# Patient Record
Sex: Female | Born: 1960
Health system: Southern US, Community
[De-identification: ages and names within clinical notes are randomized; demographics above are authoritative.]

## PROBLEM LIST (undated history)

## (undated) DIAGNOSIS — D473 Essential (hemorrhagic) thrombocythemia: Secondary | ICD-10-CM

## (undated) DIAGNOSIS — D649 Anemia, unspecified: Secondary | ICD-10-CM

## (undated) DIAGNOSIS — E785 Hyperlipidemia, unspecified: Secondary | ICD-10-CM

## (undated) DIAGNOSIS — F411 Generalized anxiety disorder: Secondary | ICD-10-CM

## (undated) DIAGNOSIS — I255 Ischemic cardiomyopathy: Secondary | ICD-10-CM

## (undated) DIAGNOSIS — Z95810 Presence of automatic (implantable) cardiac defibrillator: Secondary | ICD-10-CM

## (undated) DIAGNOSIS — I671 Cerebral aneurysm, nonruptured: Secondary | ICD-10-CM

## (undated) DIAGNOSIS — T781XXA Other adverse food reactions, not elsewhere classified, initial encounter: Secondary | ICD-10-CM

## (undated) DIAGNOSIS — K219 Gastro-esophageal reflux disease without esophagitis: Secondary | ICD-10-CM

## (undated) DIAGNOSIS — T7819XA Other adverse food reactions, not elsewhere classified, initial encounter: Secondary | ICD-10-CM

## (undated) DIAGNOSIS — M1990 Unspecified osteoarthritis, unspecified site: Secondary | ICD-10-CM

## (undated) DIAGNOSIS — I340 Nonrheumatic mitral (valve) insufficiency: Secondary | ICD-10-CM

## (undated) DIAGNOSIS — G709 Myoneural disorder, unspecified: Secondary | ICD-10-CM

## (undated) DIAGNOSIS — C44321 Squamous cell carcinoma of skin of nose: Secondary | ICD-10-CM

## (undated) DIAGNOSIS — C3490 Malignant neoplasm of unspecified part of unspecified bronchus or lung: Secondary | ICD-10-CM

## (undated) DIAGNOSIS — K589 Irritable bowel syndrome without diarrhea: Secondary | ICD-10-CM

## (undated) DIAGNOSIS — B029 Zoster without complications: Secondary | ICD-10-CM

## (undated) DIAGNOSIS — A599 Trichomoniasis, unspecified: Secondary | ICD-10-CM

## (undated) DIAGNOSIS — I739 Peripheral vascular disease, unspecified: Secondary | ICD-10-CM

## (undated) DIAGNOSIS — I10 Essential (primary) hypertension: Secondary | ICD-10-CM

## (undated) DIAGNOSIS — I2510 Atherosclerotic heart disease of native coronary artery without angina pectoris: Secondary | ICD-10-CM

## (undated) DIAGNOSIS — Z972 Presence of dental prosthetic device (complete) (partial): Secondary | ICD-10-CM

## (undated) DIAGNOSIS — Z8701 Personal history of pneumonia (recurrent): Secondary | ICD-10-CM

## (undated) DIAGNOSIS — C44319 Basal cell carcinoma of skin of other parts of face: Secondary | ICD-10-CM

## (undated) DIAGNOSIS — Z973 Presence of spectacles and contact lenses: Secondary | ICD-10-CM

## (undated) DIAGNOSIS — I5020 Unspecified systolic (congestive) heart failure: Secondary | ICD-10-CM

## (undated) DIAGNOSIS — F32A Depression, unspecified: Secondary | ICD-10-CM

## (undated) DIAGNOSIS — Z720 Tobacco use: Secondary | ICD-10-CM

## (undated) DIAGNOSIS — T7840XA Allergy, unspecified, initial encounter: Secondary | ICD-10-CM

## (undated) DIAGNOSIS — I214 Non-ST elevation (NSTEMI) myocardial infarction: Secondary | ICD-10-CM

## (undated) DIAGNOSIS — I509 Heart failure, unspecified: Secondary | ICD-10-CM

## (undated) HISTORY — DX: Allergy, unspecified, initial encounter: T78.40XA

## (undated) HISTORY — PX: JOINT REPLACEMENT: SHX530

## (undated) HISTORY — DX: Unspecified osteoarthritis, unspecified site: M19.90

## (undated) HISTORY — DX: Trichomoniasis, unspecified: A59.9

## (undated) HISTORY — DX: Anemia, unspecified: D64.9

## (undated) HISTORY — PX: APPENDECTOMY: SHX54

## (undated) HISTORY — DX: Myoneural disorder, unspecified: G70.9

## (undated) HISTORY — DX: Depression, unspecified: F32.A

## (undated) HISTORY — DX: Peripheral vascular disease, unspecified: I73.9

---

## 1982-05-09 HISTORY — PX: CHOLECYSTECTOMY OPEN: SUR202

## 1985-05-09 HISTORY — PX: TUBAL LIGATION: SHX77

## 2007-05-10 HISTORY — PX: VAGINAL HYSTERECTOMY: SUR661

## 2007-06-20 ENCOUNTER — Other Ambulatory Visit: Admission: RE | Admit: 2007-06-20 | Discharge: 2007-06-20 | Payer: Self-pay | Admitting: Obstetrics and Gynecology

## 2007-08-09 ENCOUNTER — Ambulatory Visit (HOSPITAL_COMMUNITY): Admission: RE | Admit: 2007-08-09 | Discharge: 2007-08-10 | Payer: Self-pay | Admitting: Obstetrics and Gynecology

## 2007-08-09 ENCOUNTER — Encounter: Payer: Self-pay | Admitting: Obstetrics and Gynecology

## 2010-06-25 DIAGNOSIS — R079 Chest pain, unspecified: Secondary | ICD-10-CM

## 2010-09-21 NOTE — Discharge Summary (Signed)
NAME:  DNIYA, NEUHAUS               ACCOUNT NO.:  [id number]   MEDICAL RECORD NO.:  [id number]          PATIENT TYPE:  OIB   LOCATION:  A331                          FACILITY:  [facility]   PHYSICIAN:  Tilda Burrow, M.D. DATE OF BIRTH:  March 27, 1961   DATE OF ADMISSION:  08/09/2007  DATE OF DISCHARGE:  04/03/2009LH                               DISCHARGE SUMMARY   ADMITTING DIAGNOSES:  1. Dysmenorrhea.  2. Dyspareunia.  3. Uterine retroversion.  4. History of abnormal Pap smear.   DISCHARGE DIAGNOSES:  1. Dysmenorrhea.  2. Dyspareunia.  3. Uterine retroversion.  4. History of abnormal Pap smear.   PROCEDURE:  Vaginal hysterectomy, Jannifer Franklin, MD, August 09, 2007.   DISCHARGE MEDS:  1. Vicodin 5/500, one q.4h. p.r.n. pain.  2. Motrin 200 mg, two q.4h. p.r.n. mild pain.  3. Doxycycline 100 mg p.o. b.i.d. x5 days.   HOSPITAL SUMMARY:  This 50 year old multip female was admitted for  vaginal hysterectomy for dysmenorrhea and dyspareunia as described in  the HPI.  The patient underwent vaginal hysterectomy on August 09, 2007,  with 100 mL blood loss.  She received IV fluids.  Labs were notable for  white count of 8200 on admission, hemoglobin 13, hematocrit 38.4.  Postoperative results include white count 12,000, hemoglobin 11,  hematocrit 32.  She was tolerating a regular diet by the end of the  operative day, she remained afebrile, pulse in the 80s, O2 sat 99% on  room air with blood pressures running 107-137 systolic/46-89 diastolic,  which is normal for her.  She is discharged home.  The antibiotics are  precautionary given the minimal elevation of white count.  Patient is to  watch her fever, increasing abdominal pain, nausea, vomiting and report  problems for any of those findings.  Follow up in 2 weeks, then 6 weeks.      Tilda Burrow, M.D.  Electronically Signed    JVF/MEDQ  D:  08/10/2007  T:  08/10/2007  Job:  [id number]

## 2010-09-21 NOTE — H&P (Signed)
NAME:  Erin [name], Erin [name]               ACCOUNT NO.:  [id number]   MEDICAL RECORD NO.:  [id number]         PATIENT TYPE:  PAMB   LOCATION:  DAY                           FACILITY:  [facility]   PHYSICIAN:  Tilda Burrow, M.D. DATE OF BIRTH:  14-Dec-1960   DATE OF ADMISSION:  08/09/2007  DATE OF DISCHARGE:  LH                              HISTORY & PHYSICAL   PREOPERATIVE DIAGNOSES:  Dysmenorrhea, dyspareunia, uterine  retroversion, history of abnormal pap smear treated with cryocautery  with normal followup.   HISTORY OF PRESENT ILLNESS:  This 50 year old multiparous female is seen  in our office for evaluation of heavy periods and painful intercourse.  She is referred courtesy of the [facility]. She was seen at  [facility] and followup with Dr. Mora Appl. She has had an  ultrasound, which showed retroverted, retroflexed uterus with a simple  ovarian cyst on each side. The uterus had a thick endometrial strip 1.3  cm but the menstrual date was not completely clear. Therefore,  endometrial biopsy has been performed, confirming that the endometrium  is normal. There was hormone effect from exogenous hormones. The patient  had examination, which showed a relatively short vaginal length, a  retroverted, retroflexed uterus. Endometrial biopsy, as stated, has been  performed. Recent pap smears were reported and per patient have been  normal. After discussion and treatment options and given the marked  uterine retroversion and its adverse impact on intercourse, combined  with the heavy periods, the patient declines consideration of  endometrial ablation for her bleeding abnormalities and desires that we  perform a hysterectomy. This is planned to be performed vaginally. Pros  and cons of surgery have been reviewed including risks, complications,  including fever, bleeding, infection, need for transfusion, or  anesthesia complications.   REVIEW OF SYSTEMS:  Negative for stress  incontinence, urge incontinence,  or bowel problems. She has bowel movements twice daily without pain or  diarrhea.   PHYSICAL EXAMINATION:  VITAL SIGNS:  Height 5 foot 2. Weight 117. Blood  pressure 116/70.  HEENT:  Pupils are equal, round, and reactive.  NECK:  Supple.  CARDIOVASCULAR:  Examination unremarkable.  ABDOMEN:  Nontender without masses.  GENITOURINARY:  External genitalia with a multiparous vaginal  examination. She had relatively short vaginal length. Retrograded,  retroflexed uterus but with cervix easily accessible and amenable to  vaginal surgery. Adnexa without masses.  RECTAL:  Tone is good.   LABORATORY DATA:  Ultrasound shows small follicular cysts, felt within  normal limits.   PLAN:  Vaginal hysterectomy August 09, 2007.      Tilda Burrow, M.D.  Electronically Signed     JVF/MEDQ  D:  07/31/2007  T:  07/31/2007  Job:  [id number]

## 2010-09-24 NOTE — Op Note (Signed)
NAME:  Erin [name], Erin [name]               ACCOUNT NO.:  [id number]   MEDICAL RECORD NO.:  [id number]          PATIENT TYPE:  OIB   LOCATION:  A331                          FACILITY:  [facility]   PHYSICIAN:  Tilda Burrow, M.D. DATE OF BIRTH:  1960-06-28   DATE OF PROCEDURE:  DATE OF DISCHARGE:  08/10/2007                               OPERATIVE REPORT   PREOPERATIVE DIAGNOSIS:  1. Dysmenorrhea.  2. Dyspareunia.  3. Uterine retroversion.  4. History of abnormal Pap smear.   POSTOPERATIVE DIAGNOSIS:  1. Dysmenorrhea.  2. Dyspareunia.  3. Uterine retroversion.  4. History of abnormal Pap smear.   PROCEDURE:  Vaginal hysterectomy.   SURGEON:  Tilda Burrow, M.D..   ASSISTANT:  __________   ANESTHESIA:  General.   COMPLICATIONS:  None.   FINDINGS:  Asymmetric cervix evident, with most of the anterior lip  having apparently disappeared in response to previous cryocautery.   DETAILS OF PROCEDURE:  The patient was taken to the operating room,  prepped and draped for vaginal procedure with high lithotomy leg  support.  The cervix was asymmetric in it is appearance with the  anterior lip of the cervix almost flushed with the anterior vaginal  wall.  However, we were able to palpate few of the margins of the  cervix.  A posterior colpotomy incision could be made in between the  uterosacral ligaments and the cul-de-sac identified.  Weighted speculum  was in the vagina, and we were able to place a curved Zeppelin clamp  across the uterosacral ligament.  Clamping, cutting, and suture ligating  with 0 chromic with the pedicle tagged.  This treated similarly on the  opposite side.  The remainder of the cervix could be circumscribed and  the skin opened.  The lower cardinal ligament can be isolated and  clamped, cut, and suture ligated on the either side.  They were then  merged up the side of the uterus on the either side being careful to  stay close to the cervix clamping, cutting, and  suture ligating.  At  this point, we are going to identify the anterior cervical vaginal  folding and open the anterior reflection of vesicouterine peritoneum.  The upper cardinal ligament and then the broad ligament could then be  serially clamped, cut, and suture ligated __________ .  The cervix was  amputated off  to allow for improved mobility and access to the upper  uterine specimen.  The cervix was rotated in the right side pedicle  clamped, cut, and suture ligated in small bites moving out all the way  to the round ligament.  This __________  uterus was freed up on the  right side.  The pedicle was tagged over and appeared normal.  Ovary was  well out of the way far from the vaginal cuff.  On the left side, the  ovary was more closely visible and easily reached.  Similar technique  was used to clamp across the broad ligament in segments, the utero-  ovarian ligament, and round ligament.  Specimen was taken off.  Pedicles  were inspected and found  to be hemostatic.  A couple of very thin  Mattress sutures were placed on the lateral vaginal wall to achieve  hemostasis on the patient's left side.  The peritoneum was closed in a  running fashion across the top of the surgical line and then the vaginal  cuff closed from the neck.  Good hemostasis was achieved.  Vaginal  packing was inserted.  Foley catheter inserted revealing a clear urine.  The patient went to recovery room in stable condition.      Tilda Burrow, M.D.  Electronically Signed     JVF/MEDQ  D:  08/14/2007  T:  08/15/2007  Job:  [id number]

## 2010-10-10 ENCOUNTER — Other Ambulatory Visit: Payer: Self-pay | Admitting: Medical

## 2011-01-31 LAB — CBC
HCT: 38.4
Hemoglobin: 13
MCHC: 34
MCV: 87
Platelets: 403 — ABNORMAL HIGH
RBC: 4.41
RDW: 13.8
WBC: 8.2

## 2011-01-31 LAB — BASIC METABOLIC PANEL
BUN: 5 — ABNORMAL LOW
CO2: 24
Calcium: 9.7
Chloride: 105
Creatinine, Ser: 0.74
GFR calc Af Amer: >60
GFR calc non Af Amer: >60
Glucose, Bld: 94
Potassium: 4.2
Sodium: 137

## 2011-01-31 LAB — TYPE AND SCREEN
ABO/RH(D): O POS
Antibody Screen: NEGATIVE

## 2011-02-01 LAB — DIFFERENTIAL
Basophils Absolute: 0
Basophils Relative: 0
Eosinophils Absolute: 0
Eosinophils Relative: 0
Lymphocytes Relative: 16
Lymphs Abs: 1.9
Monocytes Absolute: 0.8
Monocytes Relative: 7
Neutro Abs: 9.1 — ABNORMAL HIGH
Neutrophils Relative %: 76

## 2011-02-01 LAB — CBC
HCT: 32.2 — ABNORMAL LOW
Hemoglobin: 11 — ABNORMAL LOW
MCHC: 34
MCV: 86.9
Platelets: 328
RBC: 3.71 — ABNORMAL LOW
RDW: 13.8
WBC: 12 — ABNORMAL HIGH

## 2011-11-01 ENCOUNTER — Ambulatory Visit: Payer: 59 | Admitting: Family Medicine

## 2011-11-22 ENCOUNTER — Ambulatory Visit: Payer: 59 | Admitting: Family Medicine

## 2011-12-25 DIAGNOSIS — R079 Chest pain, unspecified: Secondary | ICD-10-CM

## 2011-12-26 DIAGNOSIS — R079 Chest pain, unspecified: Secondary | ICD-10-CM

## 2012-01-01 ENCOUNTER — Emergency Department (HOSPITAL_COMMUNITY)
Admission: EM | Admit: 2012-01-01 | Discharge: 2012-01-01 | Disposition: A | Discharge status: Home / Self Care | Payer: 59 | Attending: Emergency Medicine | Admitting: Emergency Medicine

## 2012-01-01 ENCOUNTER — Encounter (HOSPITAL_COMMUNITY): Payer: Self-pay | Admitting: Emergency Medicine

## 2012-01-01 ENCOUNTER — Emergency Department (HOSPITAL_COMMUNITY): Payer: 59

## 2012-01-01 DIAGNOSIS — Z9089 Acquired absence of other organs: Secondary | ICD-10-CM | POA: Insufficient documentation

## 2012-01-01 DIAGNOSIS — I10 Essential (primary) hypertension: Secondary | ICD-10-CM | POA: Insufficient documentation

## 2012-01-01 DIAGNOSIS — R079 Chest pain, unspecified: Secondary | ICD-10-CM | POA: Insufficient documentation

## 2012-01-01 DIAGNOSIS — F17200 Nicotine dependence, unspecified, uncomplicated: Secondary | ICD-10-CM | POA: Insufficient documentation

## 2012-01-01 DIAGNOSIS — R42 Dizziness and giddiness: Secondary | ICD-10-CM | POA: Insufficient documentation

## 2012-01-01 LAB — CBC
HCT: 41 % (ref 36.0–46.0)
Hemoglobin: 14.1 g/dL (ref 12.0–15.0)
MCH: 28.4 pg (ref 26.0–34.0)
MCHC: 34.4 g/dL (ref 30.0–36.0)
MCV: 82.7 fL (ref 78.0–100.0)
Platelets: 401 10*3/uL — ABNORMAL HIGH (ref 150–400)
RBC: 4.96 MIL/uL (ref 3.87–5.11)
RDW: 15 % (ref 11.5–15.5)
WBC: 10.1 10*3/uL (ref 4.0–10.5)

## 2012-01-01 LAB — BASIC METABOLIC PANEL
BUN: 10 mg/dL (ref 6–23)
CO2: 25 mEq/L (ref 19–32)
Calcium: 9.9 mg/dL (ref 8.4–10.5)
Chloride: 100 mEq/L (ref 96–112)
Creatinine, Ser: 0.77 mg/dL (ref 0.50–1.10)
GFR calc Af Amer: >90 mL/min (ref >90)
GFR calc non Af Amer: >90 mL/min (ref >90)
Glucose, Bld: 89 mg/dL (ref 70–99)
Potassium: 3.4 mEq/L — ABNORMAL LOW (ref 3.5–5.1)
Sodium: 136 mEq/L (ref 135–145)

## 2012-01-01 LAB — TROPONIN I: Troponin I: <0.3 ng/mL (ref <0.30)

## 2012-01-01 MED ORDER — ASPIRIN 81 MG PO CHEW
324.0000 mg | CHEWABLE_TABLET | Freq: Once | ORAL | Status: AC
  Administered 2012-01-01: 324 mg via ORAL
  Filled 2012-01-01: qty 4

## 2012-01-01 MED ORDER — POTASSIUM CHLORIDE CRYS ER 20 MEQ PO TBCR
20.0000 meq | EXTENDED_RELEASE_TABLET | Freq: Once | ORAL | Status: AC
  Administered 2012-01-01: 20 meq via ORAL
  Filled 2012-01-01: qty 2

## 2012-01-01 NOTE — ED Notes (Addendum)
Pt reports chest pain in the middle of her chest radiating to her left armpit. Pt reports SOB and nausea that began at 6am.

## 2012-01-01 NOTE — ED Provider Notes (Signed)
History   This chart was scribed for Erin Razor, MD by Charolett Bumpers . The patient was seen in room APA06/APA06. Patient's care was started at 0826.    CSN: [id number]  Arrival date & time 01/01/12  8657   First MD Initiated Contact with Patient 01/01/12 [phone]      No chief complaint on file.   (Consider location/radiation/quality/duration/timing/severity/associated sxs/prior treatment) HPI [place] is a 51 y.o. female who presents to the Emergency Department complaining of constant, moderate, mid-sternal chest pain that radiates to her neck and right armpit that woke her up this morning her up around 6 am. Pt reports associated SOB, bilateral foot swelling, chills, diaphoresis and nausea. Pt describes the chest pain as burning. Pt reports that her symptoms are aggravated with deep breaths. Pt denies taking any medications for her symptoms. Pt also reports she had a headache last night prior to going to bed, but states that it has since resolved. Pt denies any leg swelling or cough. Pt reports a h/o chest pain previously but not similar to today's chest pain. Pt reports that she had a stress test 2 months ago due to a different type of chest pain. Pt states she has a h/o HTN. Pt denies any h/o pancreatitis or cardiac catheterizations. Pt reports she is a smoker and reports occasional alcohol use.   Past Medical History  Diagnosis Date  . Hypertension     Past Surgical History  Procedure Date  . Abdominal hysterectomy   . Cholecystectomy     Family History  Problem Relation Age of Onset  . Stroke Mother   . Hypertension Mother   . Diabetes Mother   . Heart failure Other     History  Substance Use Topics  . Smoking status: Current Everyday Smoker -- 0.5 packs/day for 15 years    Types: Cigarettes  . Smokeless tobacco: Never Used  . Alcohol Use: Yes     OCCASSIONAL BEER    OB History    Grav Para Term Preterm Abortions TAB SAB Ect Mult Living   4 4 4        4       Review of Systems  Constitutional: Positive for chills and diaphoresis.  Respiratory: Positive for shortness of breath. Negative for cough.   Cardiovascular: Positive for chest pain. Negative for leg swelling.  Gastrointestinal: Positive for nausea.  Musculoskeletal:       Foot swelling.   Skin: Negative for rash.  All other systems reviewed and are negative.    Allergies  Review of patient's allergies indicates no known allergies.  Home Medications  No current outpatient prescriptions on file.  BP 183/110  Pulse 93  Temp 97.8 F (36.6 C) (Oral)  Resp 18  Ht 5' (1.524 m)  Wt 109 lb (49.442 kg)  BMI 21.29 kg/m2  SpO2 98%  Physical Exam  Nursing note and vitals reviewed. Constitutional: She appears well-developed and well-nourished. No distress.  HENT:  Head: Normocephalic and atraumatic.  Eyes: Conjunctivae are normal. Right eye exhibits no discharge. Left eye exhibits no discharge.  Neck: Neck supple.  Cardiovascular: Normal rate, regular rhythm and normal heart sounds.  Exam reveals no gallop and no friction rub.   No murmur heard. Pulmonary/Chest: Effort normal and breath sounds normal. No respiratory distress. She exhibits tenderness.       Left peristernal border chest wall tenderness. No overlying skin changes.   Abdominal: Soft. She exhibits no distension. There is no tenderness.  Musculoskeletal: She  exhibits no edema and no tenderness.       No calf tenderness. No extremity edema.   Neurological: She is alert.  Skin: Skin is warm and dry.  Psychiatric: She has a normal mood and affect. Her behavior is normal. Thought content normal.    ED Course  Procedures (including critical care time)  DIAGNOSTIC STUDIES: Oxygen Saturation is 98% on room air, normal by my interpretation.    COORDINATION OF CARE:  08:36-Discussed planned course of treatment with the patient including chest x-ray, blood work and an aspirin, who is agreeable at this time.    08:45-Medication Orders: Aspirin chewable tablet 324 mg-once.   Results for orders placed during the hospital encounter of 01/01/12  CBC      Component Value Range   WBC 10.1  4.0 - 10.5 K/uL   RBC 4.96  3.87 - 5.11 MIL/uL   Hemoglobin 14.1  12.0 - 15.0 g/dL   HCT 16.1  09.6 - 04.5 %   MCV 82.7  78.0 - 100.0 fL   MCH 28.4  26.0 - 34.0 pg   MCHC 34.4  30.0 - 36.0 g/dL   RDW 40.9  81.1 - 91.4 %   Platelets 401 (*) 150 - 400 K/uL  TROPONIN I      Component Value Range   Troponin I <0.30  <0.30 ng/mL  BASIC METABOLIC PANEL      Component Value Range   Sodium 136  135 - 145 mEq/L   Potassium 3.4 (*) 3.5 - 5.1 mEq/L   Chloride 100  96 - 112 mEq/L   CO2 25  19 - 32 mEq/L   Glucose, Bld 89  70 - 99 mg/dL   BUN 10  6 - 23 mg/dL   Creatinine, Ser 7.82  0.50 - 1.10 mg/dL   Calcium 9.9  8.4 - 95.6 mg/dL   GFR calc non Af Amer >90  >90 mL/min   GFR calc Af Amer >90  >90 mL/min   Dg Chest 2 View  01/01/2012  *RADIOLOGY REPORT*  Clinical Data: Chest pain, dizziness  CHEST - 2 VIEW  Comparison: 12/25/2011  Findings: Lungs are clear. No pleural effusion or pneumothorax.  Cardiomediastinal silhouette is within normal limits.  Mild degenerative changes of the visualized thoracolumbar spine.  IMPRESSION: No evidence of acute cardiopulmonary disease.   Original Report Authenticated By: Charline Bills, M.D.    EKG:  Rhythm: Normal sinus Rate: 92 Axis: normal Intervals/Conduction: LAE ST segments: normal   1. Chest pain       MDM  50yF with CP. Consider ACS but doubt. Atypical given reproducibility with palpation. Pt just had stress test 1 week ago and normal. EKG today without ischemic changes. Trop normal. CXR without findings. Consider other emergent causes of CP such as PE, dissection, esophageal rupture, etc but doubt. Plan symptomatic tx at this time. Return precautions discussed. Outpt fu.   I personally preformed the services scribed in my presence. The recorded information  has been reviewed and considered. Erin Razor, MD.   9:46 AM Received records from [facility]. Pt had exercise stress echo on 12/26/2011. Results as follows:  1. Exercise capacity is good. 2. The patient achieved a level of 10 METS. 3. This is a negative electrocardiographic stress test. 4. This is a negative echocardiographic stress test. 5. Echocardiographic findings are consistent with normal left ventricular function.     Erin Razor, MD 01/01/12 1017

## 2013-04-19 ENCOUNTER — Ambulatory Visit: Payer: Self-pay | Admitting: Family Medicine

## 2013-05-07 ENCOUNTER — Other Ambulatory Visit: Payer: Self-pay | Admitting: Orthopedic Surgery

## 2013-05-08 ENCOUNTER — Encounter (HOSPITAL_BASED_OUTPATIENT_CLINIC_OR_DEPARTMENT_OTHER): Payer: Self-pay | Admitting: *Deleted

## 2013-05-08 NOTE — Progress Notes (Signed)
Will come in for ekg-bmet 

## 2013-05-10 ENCOUNTER — Encounter (HOSPITAL_BASED_OUTPATIENT_CLINIC_OR_DEPARTMENT_OTHER)
Admission: RE | Admit: 2013-05-10 | Discharge: 2013-05-10 | Disposition: A | Discharge status: Home / Self Care | Payer: Worker's Compensation | Source: Ambulatory Visit | Attending: Orthopedic Surgery | Admitting: Orthopedic Surgery

## 2013-05-10 DIAGNOSIS — Z01812 Encounter for preprocedural laboratory examination: Secondary | ICD-10-CM | POA: Diagnosis not present

## 2013-05-10 DIAGNOSIS — Z01810 Encounter for preprocedural cardiovascular examination: Secondary | ICD-10-CM | POA: Diagnosis not present

## 2013-05-10 DIAGNOSIS — Z01818 Encounter for other preprocedural examination: Secondary | ICD-10-CM | POA: Insufficient documentation

## 2013-05-10 LAB — BASIC METABOLIC PANEL
BUN: 10 mg/dL (ref 6–23)
CO2: 24 mEq/L (ref 19–32)
Calcium: 9.2 mg/dL (ref 8.4–10.5)
Chloride: 104 mEq/L (ref 96–112)
Creatinine, Ser: 0.68 mg/dL (ref 0.50–1.10)
GFR calc Af Amer: >90 mL/min (ref >90)
GFR calc non Af Amer: >90 mL/min (ref >90)
Glucose, Bld: 89 mg/dL (ref 70–99)
Potassium: 4.2 mEq/L (ref 3.7–5.3)
Sodium: 140 mEq/L (ref 137–147)

## 2013-05-14 ENCOUNTER — Encounter (HOSPITAL_BASED_OUTPATIENT_CLINIC_OR_DEPARTMENT_OTHER)
Admission: RE | Disposition: A | Discharge status: Home / Self Care | Payer: Self-pay | Source: Ambulatory Visit | Attending: Orthopedic Surgery

## 2013-05-14 ENCOUNTER — Ambulatory Visit (HOSPITAL_BASED_OUTPATIENT_CLINIC_OR_DEPARTMENT_OTHER): Payer: Worker's Compensation | Admitting: Certified Registered"

## 2013-05-14 ENCOUNTER — Encounter (HOSPITAL_BASED_OUTPATIENT_CLINIC_OR_DEPARTMENT_OTHER): Payer: Worker's Compensation | Admitting: Certified Registered"

## 2013-05-14 ENCOUNTER — Ambulatory Visit (HOSPITAL_BASED_OUTPATIENT_CLINIC_OR_DEPARTMENT_OTHER)
Admission: RE | Admit: 2013-05-14 | Discharge: 2013-05-14 | Disposition: A | Discharge status: Home / Self Care | Payer: Worker's Compensation | Source: Ambulatory Visit | Attending: Orthopedic Surgery | Admitting: Orthopedic Surgery

## 2013-05-14 ENCOUNTER — Encounter (HOSPITAL_BASED_OUTPATIENT_CLINIC_OR_DEPARTMENT_OTHER): Payer: Self-pay | Admitting: Certified Registered"

## 2013-05-14 DIAGNOSIS — M19049 Primary osteoarthritis, unspecified hand: Secondary | ICD-10-CM | POA: Insufficient documentation

## 2013-05-14 DIAGNOSIS — F17200 Nicotine dependence, unspecified, uncomplicated: Secondary | ICD-10-CM | POA: Insufficient documentation

## 2013-05-14 DIAGNOSIS — J4489 Other specified chronic obstructive pulmonary disease: Secondary | ICD-10-CM | POA: Insufficient documentation

## 2013-05-14 DIAGNOSIS — J449 Chronic obstructive pulmonary disease, unspecified: Secondary | ICD-10-CM | POA: Insufficient documentation

## 2013-05-14 DIAGNOSIS — I10 Essential (primary) hypertension: Secondary | ICD-10-CM | POA: Insufficient documentation

## 2013-05-14 DIAGNOSIS — K219 Gastro-esophageal reflux disease without esophagitis: Secondary | ICD-10-CM | POA: Insufficient documentation

## 2013-05-14 HISTORY — PX: FINGER ARTHROPLASTY: SHX5017

## 2013-05-14 HISTORY — DX: Presence of dental prosthetic device (complete) (partial): Z97.2

## 2013-05-14 HISTORY — DX: Presence of spectacles and contact lenses: Z97.3

## 2013-05-14 HISTORY — DX: Gastro-esophageal reflux disease without esophagitis: K21.9

## 2013-05-14 LAB — POCT HEMOGLOBIN-HEMACUE: Hemoglobin: 14.7 g/dL (ref 12.0–15.0)

## 2013-05-14 SURGERY — ARTHROPLASTY, FINGER
Anesthesia: General | Site: Thumb | Laterality: Left

## 2013-05-14 MED ORDER — FENTANYL CITRATE 0.05 MG/ML IJ SOLN
50.0000 ug | INTRAMUSCULAR | Status: DC | PRN
  Administered 2013-05-14: 100 ug via INTRAVENOUS

## 2013-05-14 MED ORDER — LACTATED RINGERS IV SOLN
INTRAVENOUS | Status: DC
  Administered 2013-05-14: 20 mL/h via INTRAVENOUS
  Administered 2013-05-14: 11:00:00 via INTRAVENOUS

## 2013-05-14 MED ORDER — HYDROMORPHONE HCL PF 1 MG/ML IJ SOLN
0.2500 mg | INTRAMUSCULAR | Status: DC | PRN
  Administered 2013-05-14: 0.5 mg via INTRAVENOUS

## 2013-05-14 MED ORDER — OXYCODONE HCL 5 MG PO TABS: 5.0000 mg | ORAL_TABLET | Freq: Once | ORAL | Status: DC | PRN

## 2013-05-14 MED ORDER — EPHEDRINE SULFATE 50 MG/ML IJ SOLN
INTRAMUSCULAR | Status: DC | PRN
  Administered 2013-05-14: 10 mg via INTRAVENOUS

## 2013-05-14 MED ORDER — MIDAZOLAM HCL 2 MG/2ML IJ SOLN
INTRAMUSCULAR | Status: AC
  Filled 2013-05-14: qty 2

## 2013-05-14 MED ORDER — CHLORHEXIDINE GLUCONATE 4 % EX LIQD: 60.0000 mL | Freq: Once | CUTANEOUS | Status: DC

## 2013-05-14 MED ORDER — ONDANSETRON HCL 4 MG/2ML IJ SOLN
INTRAMUSCULAR | Status: DC | PRN
  Administered 2013-05-14: 4 mg via INTRAVENOUS

## 2013-05-14 MED ORDER — BUPIVACAINE-EPINEPHRINE PF 0.5-1:200000 % IJ SOLN
INTRAMUSCULAR | Status: DC | PRN
  Administered 2013-05-14: 25 mL

## 2013-05-14 MED ORDER — MIDAZOLAM HCL 5 MG/5ML IJ SOLN
INTRAMUSCULAR | Status: DC | PRN
  Administered 2013-05-14: 1 mg via INTRAVENOUS

## 2013-05-14 MED ORDER — CEFAZOLIN SODIUM 1-5 GM-% IV SOLN
INTRAVENOUS | Status: AC
  Filled 2013-05-14: qty 50

## 2013-05-14 MED ORDER — MIDAZOLAM HCL 2 MG/2ML IJ SOLN: 1.0000 mg | INTRAMUSCULAR | Status: DC | PRN

## 2013-05-14 MED ORDER — HYDROMORPHONE HCL PF 1 MG/ML IJ SOLN
INTRAMUSCULAR | Status: AC
  Filled 2013-05-14: qty 1

## 2013-05-14 MED ORDER — FENTANYL CITRATE 0.05 MG/ML IJ SOLN
INTRAMUSCULAR | Status: AC
  Filled 2013-05-14: qty 2

## 2013-05-14 MED ORDER — DEXAMETHASONE SODIUM PHOSPHATE 10 MG/ML IJ SOLN
INTRAMUSCULAR | Status: DC | PRN
  Administered 2013-05-14: 4 mg

## 2013-05-14 MED ORDER — MIDAZOLAM HCL 2 MG/2ML IJ SOLN
1.0000 mg | INTRAMUSCULAR | Status: DC | PRN
  Administered 2013-05-14: 2 mg via INTRAVENOUS

## 2013-05-14 MED ORDER — DEXAMETHASONE SODIUM PHOSPHATE 10 MG/ML IJ SOLN
INTRAMUSCULAR | Status: DC | PRN
  Administered 2013-05-14: 10 mg via INTRAVENOUS

## 2013-05-14 MED ORDER — HYDROCODONE-ACETAMINOPHEN 5-325 MG PO TABS: ORAL_TABLET | ORAL | Status: DC

## 2013-05-14 MED ORDER — PROMETHAZINE HCL 25 MG/ML IJ SOLN: 6.2500 mg | INTRAMUSCULAR | Status: DC | PRN

## 2013-05-14 MED ORDER — CEFAZOLIN SODIUM-DEXTROSE 2-3 GM-% IV SOLR
2.0000 g | INTRAVENOUS | Status: AC
  Administered 2013-05-14: 2 g via INTRAVENOUS

## 2013-05-14 MED ORDER — OXYCODONE HCL 5 MG/5ML PO SOLN: 5.0000 mg | Freq: Once | ORAL | Status: DC | PRN

## 2013-05-14 MED ORDER — BUPIVACAINE HCL (PF) 0.25 % IJ SOLN
INTRAMUSCULAR | Status: AC
  Filled 2013-05-14: qty 30

## 2013-05-14 MED ORDER — LIDOCAINE HCL (CARDIAC) 20 MG/ML IV SOLN
INTRAVENOUS | Status: DC | PRN
  Administered 2013-05-14: 30 mg via INTRAVENOUS

## 2013-05-14 MED ORDER — PROPOFOL 10 MG/ML IV BOLUS
INTRAVENOUS | Status: DC | PRN
  Administered 2013-05-14: 150 mg via INTRAVENOUS

## 2013-05-14 MED ORDER — FENTANYL CITRATE 0.05 MG/ML IJ SOLN: 50.0000 ug | Freq: Once | INTRAMUSCULAR | Status: DC

## 2013-05-14 MED ORDER — PROPOFOL 10 MG/ML IV EMUL
INTRAVENOUS | Status: AC
Start: 2013-05-14 — End: 2013-05-14
  Filled 2013-05-14: qty 50

## 2013-05-14 MED ORDER — FENTANYL CITRATE 0.05 MG/ML IJ SOLN
INTRAMUSCULAR | Status: AC
  Filled 2013-05-14: qty 6

## 2013-05-14 SURGICAL SUPPLY — 69 items
BANDAGE ELASTIC 3 VELCRO ST LF (GAUZE/BANDAGES/DRESSINGS) IMPLANT
BIT DRILL 7/64X5 DISP (BIT) ×2 IMPLANT
BLADE MINI RND TIP GREEN BEAV (BLADE) ×2 IMPLANT
BLADE SURG 15 STRL LF DISP TIS (BLADE) ×2 IMPLANT
BLADE SURG 15 STRL SS (BLADE) ×2
BNDG ELASTIC 2 VLCR STRL LF (GAUZE/BANDAGES/DRESSINGS) IMPLANT
BNDG ESMARK 4X9 LF (GAUZE/BANDAGES/DRESSINGS) ×2 IMPLANT
BNDG GAUZE ELAST 4 BULKY (GAUZE/BANDAGES/DRESSINGS) ×2 IMPLANT
CHLORAPREP W/TINT 26ML (MISCELLANEOUS) ×2 IMPLANT
CORDS BIPOLAR (ELECTRODE) ×2 IMPLANT
COVER MAYO STAND STRL (DRAPES) ×2 IMPLANT
COVER TABLE BACK 60X90 (DRAPES) ×2 IMPLANT
CUFF TOURNIQUET SINGLE 18IN (TOURNIQUET CUFF) ×2 IMPLANT
DECANTER SPIKE VIAL GLASS SM (MISCELLANEOUS) IMPLANT
DRAPE EXTREMITY T 121X128X90 (DRAPE) ×2 IMPLANT
DRAPE OEC MINIVIEW 54X84 (DRAPES) ×2 IMPLANT
DRAPE SURG 17X23 STRL (DRAPES) ×2 IMPLANT
DRILL BIT 1/8DIAX5INL DISPOSE (BIT) ×2 IMPLANT
GAUZE XEROFORM 1X8 LF (GAUZE/BANDAGES/DRESSINGS) ×2 IMPLANT
GLOVE BIO SURGEON STRL SZ7.5 (GLOVE) ×2 IMPLANT
GLOVE BIOGEL PI IND STRL 7.0 (GLOVE) ×1 IMPLANT
GLOVE BIOGEL PI IND STRL 8 (GLOVE) ×1 IMPLANT
GLOVE BIOGEL PI IND STRL 8.5 (GLOVE) ×1 IMPLANT
GLOVE BIOGEL PI INDICATOR 7.0 (GLOVE) ×1
GLOVE BIOGEL PI INDICATOR 8 (GLOVE) ×1
GLOVE BIOGEL PI INDICATOR 8.5 (GLOVE) ×1
GLOVE ECLIPSE 7.0 STRL STRAW (GLOVE) ×2 IMPLANT
GLOVE SURG ORTHO 8.0 STRL STRW (GLOVE) ×2 IMPLANT
GOWN STRL REUS W/ TWL LRG LVL3 (GOWN DISPOSABLE) ×1 IMPLANT
GOWN STRL REUS W/TWL LRG LVL3 (GOWN DISPOSABLE) ×1
GOWN STRL REUS W/TWL XL LVL3 (GOWN DISPOSABLE) ×2 IMPLANT
K-WIRE .035X4 (WIRE) IMPLANT
NDL SAFETY ECLIPSE 18X1.5 (NEEDLE) IMPLANT
NEEDLE HYPO 18GX1.5 SHARP (NEEDLE)
NEEDLE HYPO 22GX1.5 SAFETY (NEEDLE) IMPLANT
NEEDLE HYPO 25X1 1.5 SAFETY (NEEDLE) IMPLANT
NEEDLE KEITH (NEEDLE) IMPLANT
NS IRRIG 1000ML POUR BTL (IV SOLUTION) ×2 IMPLANT
PACK BASIN DAY SURGERY FS (CUSTOM PROCEDURE TRAY) ×2 IMPLANT
PAD ABD 8X10 STRL (GAUZE/BANDAGES/DRESSINGS) IMPLANT
PAD CAST 3X4 CTTN HI CHSV (CAST SUPPLIES) ×1 IMPLANT
PAD CAST 4YDX4 CTTN HI CHSV (CAST SUPPLIES) IMPLANT
PADDING CAST ABS 4INX4YD NS (CAST SUPPLIES) ×1
PADDING CAST ABS COTTON 4X4 ST (CAST SUPPLIES) ×1 IMPLANT
PADDING CAST COTTON 3X4 STRL (CAST SUPPLIES) ×1
PADDING CAST COTTON 4X4 STRL (CAST SUPPLIES)
PASSER SUT SWANSON 36MM LOOP (INSTRUMENTS) IMPLANT
SLEEVE SCD COMPRESS KNEE MED (MISCELLANEOUS) IMPLANT
SPLINT FAST PLASTER 5X30 (CAST SUPPLIES)
SPLINT PLASTER CAST FAST 5X30 (CAST SUPPLIES) IMPLANT
SPLINT PLASTER CAST XFAST 4X15 (CAST SUPPLIES) ×20 IMPLANT
SPLINT PLASTER XTRA FAST SET 4 (CAST SUPPLIES) ×20
SPONGE GAUZE 4X4 12PLY (GAUZE/BANDAGES/DRESSINGS) ×2 IMPLANT
STAPLER VISISTAT 35W (STAPLE) IMPLANT
STOCKINETTE 4X48 STRL (DRAPES) ×2 IMPLANT
SUT ETHIBOND 3-0 V-5 (SUTURE) ×4 IMPLANT
SUT ETHILON 3 0 PS 1 (SUTURE) IMPLANT
SUT ETHILON 4 0 PS 2 18 (SUTURE) IMPLANT
SUT FIBERWIRE 2-0 18 17.9 3/8 (SUTURE)
SUT MERSILENE 2.0 SH NDLE (SUTURE) IMPLANT
SUT MERSILENE 4 0 P 3 (SUTURE) IMPLANT
SUT SILK 4 0 PS 2 (SUTURE) IMPLANT
SUT VIC AB 0 SH 27 (SUTURE) IMPLANT
SUT VICRYL 4-0 PS2 18IN ABS (SUTURE) ×2 IMPLANT
SUTURE FIBERWR 2-0 18 17.9 3/8 (SUTURE) IMPLANT
SYR BULB 3OZ (MISCELLANEOUS) ×2 IMPLANT
SYR CONTROL 10ML LL (SYRINGE) IMPLANT
TOWEL OR 17X24 6PK STRL BLUE (TOWEL DISPOSABLE) ×4 IMPLANT
UNDERPAD 30X30 INCONTINENT (UNDERPADS AND DIAPERS) ×2 IMPLANT

## 2013-05-14 NOTE — Op Note (Signed)
[id number] 

## 2013-05-14 NOTE — Transfer of Care (Signed)
Immediate Anesthesia Transfer of Care Note  Patient: Erin [name]  Procedure(s) Performed: Procedure(s): LEFT THUMB CARPAL METACARPAL ARTHROPLASTY (Left)  Patient Location: PACU  Anesthesia Type:GA combined with regional for post-op pain  Level of Consciousness: awake and patient cooperative  Airway & Oxygen Therapy: Patient Spontanous Breathing and Patient connected to face mask oxygen  Post-op Assessment: Report given to PACU RN and Post -op Vital signs reviewed and stable  Post vital signs: Reviewed and stable  Complications: No apparent anesthesia complications

## 2013-05-14 NOTE — Anesthesia Postprocedure Evaluation (Signed)
  Anesthesia Post-op Note  Patient: Erin [name]  Procedure(s) Performed: Procedure(s): LEFT THUMB CARPAL METACARPAL ARTHROPLASTY (Left)  Patient Location: PACU  Anesthesia Type:General and GA combined with regional for post-op pain  Level of Consciousness: awake  Airway and Oxygen Therapy: Patient Spontanous Breathing  Post-op Pain: mild  Post-op Assessment: Post-op Vital signs reviewed, Patient's Cardiovascular Status Stable, Respiratory Function Stable, Patent Airway, No signs of Nausea or vomiting and Pain level controlled  Post-op Vital Signs: Reviewed and stable  Complications: No apparent anesthesia complications

## 2013-05-14 NOTE — Brief Op Note (Signed)
05/14/2013  11:57 AM  PATIENT:  [name]  53 y.o. female  PRE-OPERATIVE DIAGNOSIS:  LEFT THUMB CARPAL METACARPAL OSTEOARTHRITIS  POST-OPERATIVE DIAGNOSIS:  LEFT THUMB CARPAL METACARPAL OSTEOARTHRITIS  PROCEDURE:  Procedure(s): LEFT THUMB CARPAL METACARPAL ARTHROPLASTY (Left)  SURGEON:  Surgeon(s) and Role:    * Tennis Must, MD - Primary    * Wynonia Sours, MD - Assisting  PHYSICIAN ASSISTANT:   ASSISTANTS: Daryll Brod, MD   ANESTHESIA:   regional and general  EBL:  Total I/O In: 1300 [I.V.:1300] Out: -   BLOOD ADMINISTERED:none  DRAINS: none   LOCAL MEDICATIONS USED:  NONE  SPECIMEN:  No Specimen  DISPOSITION OF SPECIMEN:  N/A  COUNTS:  YES  TOURNIQUET:   Total Tourniquet Time Documented: Upper Arm (Left) - 61 minutes Total: Upper Arm (Left) - 61 minutes   DICTATION: .Other Dictation: Dictation Number [id number]  PLAN OF CARE: Discharge to home after PACU  PATIENT DISPOSITION:  PACU - hemodynamically stable.

## 2013-05-14 NOTE — Anesthesia Preprocedure Evaluation (Addendum)
Anesthesia Evaluation  Patient identified by MRN, date of birth, ID band Patient awake    Reviewed: Allergy & Precautions, H&P , NPO status , Patient's Chart, lab work & pertinent test results  Airway Mallampati: I TM Distance: >3 FB Neck ROM: Full    Dental   Pulmonary COPDCurrent Smoker,  + rhonchi         Cardiovascular hypertension, Rhythm:Regular Rate:Normal     Neuro/Psych    GI/Hepatic GERD-  Controlled,  Endo/Other    Renal/GU      Musculoskeletal   Abdominal   Peds  Hematology   Anesthesia Other Findings   Reproductive/Obstetrics                          Anesthesia Physical Anesthesia Plan  ASA: II  Anesthesia Plan: General   Post-op Pain Management:    Induction: Intravenous  Airway Management Planned: LMA  Additional Equipment:   Intra-op Plan:   Post-operative Plan: Extubation in OR  Informed Consent: I have reviewed the patients History and Physical, chart, labs and discussed the procedure including the risks, benefits and alternatives for the proposed anesthesia with the patient or authorized representative who has indicated his/her understanding and acceptance.     Plan Discussed with: CRNA and Surgeon  Anesthesia Plan Comments:         Anesthesia Quick Evaluation

## 2013-05-14 NOTE — Anesthesia Procedure Notes (Addendum)
Anesthesia Regional Block:  Supraclavicular block  Pre-Anesthetic Checklist: ,, timeout performed, Correct Patient, Correct Site, Correct Laterality, Correct Procedure, Correct Position, site marked, Risks and benefits discussed,  Surgical consent,  Pre-op evaluation,  At surgeon's request and post-op pain management  Laterality: Left  Prep: chloraprep       Needles:  Injection technique: Single-shot  Needle Type: Echogenic Stimulator Needle     Needle Length: 5cm 5 cm Needle Gauge: 22 and 22 G    Additional Needles:  Procedures: ultrasound guided (picture in chart) and nerve stimulator Supraclavicular block  Nerve Stimulator or Paresthesia:  Response: 0.6 mA,   Additional Responses:   Narrative:  Start time: 05/14/2013 10:01 AM End time: 05/14/2013 10:10 AM Injection made incrementally with aspirations every 5 mL. Anesthesiologist: Dr Chriss Driver  Additional Notes: 1001-1010 L Supraclavicular Nerve Block POP CHG prep, sterile tech #22 stim/echo needle with good [place] visualization-PIX in chart- and stim down to .68ma Pt has smokers' cough and had one especially violent episode during the block but not related to it at which time the needle was removed Doubt any possibility of lung puncture but will observe closely Multiple neg asp Altamese Dilling .5% w/epi 1:200000 total 25cc+ decadron 4mg  infiltrated No other compl Dr Chriss Driver     Procedure Name: LMA Insertion Date/Time: 05/14/2013 10:38 AM Performed by: Alois Colgan Pre-anesthesia Checklist: Patient identified, Emergency Drugs available, Suction available and Patient being monitored Patient Re-evaluated:Patient Re-evaluated prior to inductionOxygen Delivery Method: Circle System Utilized Preoxygenation: Pre-oxygenation with 100% oxygen Intubation Type: IV induction Ventilation: Mask ventilation without difficulty LMA: LMA inserted LMA Size: 4.0 Number of attempts: 1 Airway Equipment and Method: bite block Placement  Confirmation: positive ETCO2 Tube secured with: Tape Dental Injury: Teeth and Oropharynx as per pre-operative assessment

## 2013-05-14 NOTE — H&P (Signed)
  Erin [name] is an 53 y.o. female.   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  Has had continued pain in cmc of thumb.  Has undergone non operative care including splinting, antiinflammatories, injections without lasting relief.  She has elected to undergo cmc arthroplasty for management of symptoms.  Past Medical History  Diagnosis Date  . Hypertension   . GERD (gastroesophageal reflux disease)   . Wears glasses   . Wears dentures     top    Past Surgical History  Procedure Laterality Date  . Vaginal hysterectomy  2009  . Appendectomy    . Cholecystectomy  1984  . Tubal ligation  1987    Family History  Problem Relation Age of Onset  . Stroke Mother   . Hypertension Mother   . Diabetes Mother   . Heart failure Other    Social History:  reports that she has been smoking Cigarettes.  She has a 7.5 pack-year smoking history. She has never used smokeless tobacco. She reports that she drinks alcohol. Her drug history is not on file.  Allergies: No Known Allergies  Medications Prior to Admission  Medication Sig Dispense Refill  . atenolol (TENORMIN) 25 MG tablet Take by mouth daily.      . hydrochlorothiazide (HYDRODIURIL) 25 MG tablet Take 25 mg by mouth daily.        Results for orders placed during the hospital encounter of 05/14/13 (from the past 48 hour(s))  POCT HEMOGLOBIN-HEMACUE     Status: None   Collection Time    05/14/13  9:25 AM      Result Value Range   Hemoglobin 14.7  12.0 - 15.0 g/dL    No results found.   A comprehensive review of systems was negative except for: Eyes: positive for contacts/glasses Hematologic/lymphatic: positive for bleeding Neurological: positive for headaches  Blood pressure 131/83, pulse 68, temperature 97.8 F (36.6 C), temperature source Oral, resp. rate 18, height 5\' 2"  (1.575 m), weight 123 lb (55.792 kg), SpO2  99.00%.  General appearance: alert, cooperative and appears stated age Head: Normocephalic, without obvious abnormality, atraumatic Neck: supple, symmetrical, trachea midline Resp: clear to auscultation bilaterally Cardio: regular rate and rhythm GI: non tender Extremities: intact sensation and capillary refill all digits.  +epl/fpl/io.  no wounds.   Pulses: 2+ and symmetric Skin: Skin color, texture, turgor normal. No rashes or lesions Neurologic: Grossly normal Incision/Wound: none  Assessment/Plan Left thumb CMC OA.  Non operative and operative treatment options were discussed with the patient and patient wishes to proceed with operative treatment. Risks, benefits, and alternatives of surgery were discussed and the patient agrees with the plan of care.   Mahlani Berninger R 05/14/2013, 9:50 AM

## 2013-05-14 NOTE — Progress Notes (Signed)
Assisted Dr. Chriss Driver with left, ultrasound guided, interscalene  block. Side rails up, monitors on throughout procedure. See vital signs in flow sheet. Tolerated Procedure well.

## 2013-05-14 NOTE — Discharge Instructions (Addendum)
[facility] Instructions °Hand Surgery ° °Wound Care: °Keep your hand elevated above the level of your heart.  Do not allow it to dangle by your side.  Keep the dressing dry and do not remove it unless your doctor advises you to do so.  He will usually change it at the time of your post-op visit.  Moving your fingers is advised to stimulate circulation but will depend on the site of your surgery.  If you have a splint applied, your doctor will advise you regarding movement. ° °Activity: °Do not drive or operate machinery today.  Rest today and then you may return to your normal activity and work as indicated by your physician. ° °Diet:  °Drink liquids today or eat a light diet.  You may resume a regular diet tomorrow.   ° °General expectations: °Pain for two to three days. °Fingers may become slightly swollen. ° °Call your doctor if any of the following occur: °Severe pain not relieved by pain medication. °Elevated temperature. °Dressing soaked with blood. °Inability to move fingers. °White or bluish color to fingers. ° ° °Regional Anesthesia Blocks ° °1. Numbness or the inability to move the "blocked" extremity may last from 3-48 hours after placement. The length of time depends on the medication injected and your individual response to the medication. If the numbness is not going away after 48 hours, call your surgeon. ° °2. The extremity that is blocked will need to be protected until the numbness is gone and the  Strength has returned. Because you cannot feel it, you will need to take extra care to avoid injury. Because it may be weak, you may have difficulty moving it or using it. You may not know what position it is in without looking at it while the block is in effect. ° °3. For blocks in the legs and feet, returning to weight bearing and walking needs to be done carefully. You will need to wait until the numbness is entirely gone and the strength has returned. You should be able to move your leg and foot  normally before you try and bear weight or walk. You will need someone to be with you when you first try to ensure you do not fall and possibly risk injury. ° °4. Bruising and tenderness at the needle site are common side effects and will resolve in a few days. ° °5. Persistent numbness or new problems with movement should be communicated to the surgeon or the [facility] ([phone])/ [facility] ([phone]). ° ° °Post Anesthesia Home Care Instructions ° °Activity: °Get plenty of rest for the remainder of the day. A responsible adult should stay with you for 24 hours following the procedure.  °For the next 24 hours, DO NOT: °-Drive a car °-Operate machinery °-Drink alcoholic beverages °-Take any medication unless instructed by your physician °-Make any legal decisions or sign important papers. ° °Meals: °Start with liquid foods such as gelatin or soup. Progress to regular foods as tolerated. Avoid greasy, spicy, heavy foods. If nausea and/or vomiting occur, drink only clear liquids until the nausea and/or vomiting subsides. Call your physician if vomiting continues. ° °Special Instructions/Symptoms: °Your throat may feel dry or sore from the anesthesia or the breathing tube placed in your throat during surgery. If this causes discomfort, gargle with warm salt water. The discomfort should disappear within 24 hours. ° °

## 2013-05-15 ENCOUNTER — Encounter (HOSPITAL_BASED_OUTPATIENT_CLINIC_OR_DEPARTMENT_OTHER): Payer: Self-pay | Admitting: Orthopedic Surgery

## 2013-05-15 NOTE — Op Note (Signed)
NAME:  Erin [name], Erin [name]               ACCOUNT NO.:  [id number]  MEDICAL RECORD NO.:  [id number]  LOCATION:                                 FACILITY:  PHYSICIAN:  Leanora Cover, MD             DATE OF BIRTH:  DATE OF PROCEDURE:  05/14/2013 DATE OF DISCHARGE:                              OPERATIVE REPORT   PREOPERATIVE DIAGNOSIS:  Left thumb carpometacarpal osteoarthritis.  POSTOPERATIVE DIAGNOSIS:  Left thumb carpometacarpal osteoarthritis.  PROCEDURE:  Left thumb trapeziectomy with ligament reconstruction, tendon interposition with FCR graft.  SURGEON:  Leanora Cover, MD  ASSISTANT:  Daryll Brod, MD.  ANESTHESIA:  General with regional.  IV FLUIDS:  Per anesthesia flow sheet.  ESTIMATED BLOOD LOSS:  Minimal.  COMPLICATIONS:  None.  SPECIMENS:  None.  TOURNIQUET TIME:  61 minutes.  DISPOSITION:  Stable to PACU.  INDICATIONS:  Erin [name] is [name] 53 year old female who in October 2013 suffered an injury at work causing pain in the [facility] joint of her thumb. She underwent nonoperative care with splinting, anti-inflammatories, and injections but continues to have pain.  She wished to have [name] for management of the discomfort.  Risks, benefits, alternatives of the surgery were discussed including risk of blood loss, infection; damage to nerves, vessels, tendons, ligaments, bone; failure of surgery; need for additional surgery, complications with wound healing, continued pain, and loss of range of motion and strength.  She voiced understanding of these risks and elected to proceed.  OPERATIVE COURSE:  After being identified preoperatively by myself, the patient and I agreed upon procedure and site of procedure.  Surgical site was marked.  The risks, benefits, and alternatives of surgery were reviewed and she wished to proceed.  Surgical consent had been signed. She was given IV Ancef as preoperative antibiotic prophylaxis.  Regional block was performed by  anesthesia in preoperative holding.  She was transferred to the operating room and placed on the operating table in supine position.  Left upper extremity on arm board.  General anesthesia was induced by the anesthesiologist.  The left upper extremity was prepped and draped in normal sterile orthopedic fashion.  Surgical pause was performed between surgeons, anesthesia, operating staff, and all were in agreement as to the patient, procedure, and site of procedure. Tourniquet at the proximal aspect of the extremity was inflated to 250 mmHg after exsanguination of the limb with an Esmarch bandage.  An incision was made on the thumb at the glabrous and hairy skin border. This was carried into subcutaneous tissues by spreading technique. Branches of the dorsal branch of the radial nerve were identified and protected.  An incision was made through the periosteum and capsule between the APL and EPB tendons.  The trapezium was identified.  C-arm was used to confirm that it was [name] which was the case.  The trapezium was able to be removed in its entirety by freeing up soft tissue attachments with the [facility] blade and Soil scientist.  Small remaining fragments of bone were then removed.  The wound was copiously irrigated with sterile saline.  The FCR was identified and was intact. An  incision was made on the proximal aspect of the volar forearm.  The FCR was identified here and released.  Released tendon was able to be brought down into the distal wound.  The hole in the metacarpal was made with the drill.  This was made obliquely from the dorsum of the thumb across to the volar base.  The wound was again copiously irrigated with sterile saline.  The FCR tendon was able to be brought through the hole in the metacarpal and looped over itself.  It was secured with an Ethibond suture.  Ethibond suture was placed in the deep portion of the arthroplasty site and used to create the anchovy at the  FCR which was brought down into the void from the trapezium.  This provided good interposition arthroplasty.  Capsule was repaired back over top of the arthroplasty site using Vicryl suture.  The skin was closed with 4-0 nylon in [name] horizontal mattress fashion.  The wound was dressed with sterile Xeroform, 4x4s, and, wrapped with [name] bandage.  The wound in the proximal forearm had been closed and dressed with [name] and 4x4s as well.  [name] thumb spica splint was placed and wrapped with Kerlix and Ace bandage.  Tourniquet was deflated at 61 minutes. Fingertips were pink with brisk capillary refill after deflation of tourniquet.  Operative drapes were broken down.  The patient was awoken from anesthesia safely.  She was transferred back to stretcher and taken to PACU in stable condition.  I will see her back in the office in 1 week for postoperative followup.  We will give her Norco 5/325, 1-2 p.o. q.6 hours p.r.n. pain, dispensed #40.     Leanora Cover, MD     KK/MEDQ  D:  05/14/2013  T:  05/15/2013  Job:  [id number]

## 2014-03-10 ENCOUNTER — Encounter (HOSPITAL_BASED_OUTPATIENT_CLINIC_OR_DEPARTMENT_OTHER): Payer: Self-pay | Admitting: Orthopedic Surgery

## 2015-09-05 ENCOUNTER — Inpatient Hospital Stay (HOSPITAL_COMMUNITY)
Admission: AD | Admit: 2015-09-05 | Discharge: 2015-09-06 | DRG: 313 | Disposition: A | Discharge status: Home / Self Care | Payer: Self-pay | Source: Other Acute Inpatient Hospital | Attending: Cardiology | Admitting: Cardiology

## 2015-09-05 DIAGNOSIS — Z833 Family history of diabetes mellitus: Secondary | ICD-10-CM

## 2015-09-05 DIAGNOSIS — E782 Mixed hyperlipidemia: Secondary | ICD-10-CM | POA: Diagnosis present

## 2015-09-05 DIAGNOSIS — R079 Chest pain, unspecified: Secondary | ICD-10-CM

## 2015-09-05 DIAGNOSIS — F17210 Nicotine dependence, cigarettes, uncomplicated: Secondary | ICD-10-CM | POA: Diagnosis present

## 2015-09-05 DIAGNOSIS — I10 Essential (primary) hypertension: Secondary | ICD-10-CM | POA: Diagnosis present

## 2015-09-05 DIAGNOSIS — E785 Hyperlipidemia, unspecified: Secondary | ICD-10-CM | POA: Diagnosis present

## 2015-09-05 DIAGNOSIS — R0789 Other chest pain: Principal | ICD-10-CM | POA: Insufficient documentation

## 2015-09-05 DIAGNOSIS — Z8249 Family history of ischemic heart disease and other diseases of the circulatory system: Secondary | ICD-10-CM

## 2015-09-05 DIAGNOSIS — Z79899 Other long term (current) drug therapy: Secondary | ICD-10-CM

## 2015-09-05 DIAGNOSIS — Z823 Family history of stroke: Secondary | ICD-10-CM

## 2015-09-05 DIAGNOSIS — K219 Gastro-esophageal reflux disease without esophagitis: Secondary | ICD-10-CM | POA: Diagnosis present

## 2015-09-05 HISTORY — DX: Hyperlipidemia, unspecified: E78.5

## 2015-09-05 MED ORDER — ONDANSETRON HCL 4 MG/2ML IJ SOLN: 4.0000 mg | Freq: Four times a day (QID) | INTRAMUSCULAR | Status: DC | PRN

## 2015-09-05 MED ORDER — ASPIRIN EC 81 MG PO TBEC
81.0000 mg | DELAYED_RELEASE_TABLET | Freq: Every day | ORAL | Status: DC
  Administered 2015-09-06: 81 mg via ORAL
  Filled 2015-09-05: qty 1

## 2015-09-05 MED ORDER — ACETAMINOPHEN 325 MG PO TABS
650.0000 mg | ORAL_TABLET | ORAL | Status: DC | PRN
  Administered 2015-09-06 (×2): 650 mg via ORAL
  Filled 2015-09-05 (×2): qty 2

## 2015-09-05 MED ORDER — NITROGLYCERIN 0.4 MG SL SUBL: 0.4000 mg | SUBLINGUAL_TABLET | SUBLINGUAL | Status: DC | PRN

## 2015-09-05 MED ORDER — ENOXAPARIN SODIUM 30 MG/0.3ML ~~LOC~~ SOLN: 30.0000 mg | SUBCUTANEOUS | Status: DC

## 2015-09-05 MED ORDER — METOPROLOL TARTRATE 25 MG PO TABS
25.0000 mg | ORAL_TABLET | Freq: Two times a day (BID) | ORAL | Status: DC
  Administered 2015-09-06 (×2): 25 mg via ORAL
  Filled 2015-09-05 (×2): qty 1

## 2015-09-05 NOTE — H&P (Signed)
CC: Chest pain  HPI: 55 yo woman, active smoker and history of HTN, transferred to [place] from [facility] ER. She reports having sharp pain on the right side of her neck and face last night but then slept well all night. Woke up after 8 am as normal, had her coffee, felt skipping beat and then chest pain. CP is in the left lower parasternal region, waxing and waning adn describes two kinds of pain. One is point tenderness in the left lower parasternum and other is like pressure radiating across her left breast. No dizziness, SOB, fever, cough, syncope, bleeding, diaphoresis. No known CAD, MI, HF, CVA.   At the outside ER had negative TnI, EKGx2 negative for ischemia, CT head and CXR unremarkable. Her SBP was in  160-170's. She was treated with ASA 325 ms po x1, Morphine, NTG drip.   Here she continues to have pressure and tenderness in the left lower parasternum. BP 166/99. NSR on tele. Repeat ECG here is normal.   She reports that she is off of her HCTZ for about a year. But still on Atenolol.            Review of Systems:  10 systems reveiwed unremarkable except as noted in HIP   Past Medical History  Diagnosis Date  . Hypertension   . GERD (gastroesophageal reflux disease)   . Wears glasses   . Wears dentures     top   No current facility-administered medications on file prior to encounter.   Current Outpatient Prescriptions on File Prior to Encounter  Medication Sig Dispense Refill  . atenolol (TENORMIN) 25 MG tablet Take by mouth daily.    . hydrochlorothiazide (HYDRODIURIL) 25 MG tablet Take 25 mg by mouth daily.    [name] Kitchen HYDROcodone-acetaminophen (NORCO) 5-325 MG per tablet 1-2 tabs po q6 hours prn pain 40 tablet 0     No Known Allergies  Social History   Social History  . Marital Status: Widowed    Spouse Name: N/A  . Number of Children: N/A  . Years of Education: N/A   Occupational History  . Not on file.   Social History Main Topics  . Smoking status: Current Every  Day Smoker -- 0.50 packs/day for 15 years    Types: Cigarettes  . Smokeless tobacco: Never Used  . Alcohol Use: Yes     Comment: OCCASSIONAL BEER  . Drug Use: Not on file  . Sexual Activity: Yes    Birth Control/ Protection: Surgical   Other Topics Concern  . Not on file   Social History Narrative    Family History  Problem Relation Age of Onset  . Stroke Mother   . Hypertension Mother   . Diabetes Mother   . Heart failure Other     PHYSICAL EXAM: There were no vitals filed for this visit. General:  Well appearing. No respiratory difficulty HEENT: normal Neck: supple. no JVD. Carotids 2+ bilat; no bruits. No lymphadenopathy or thryomegaly appreciated. Cor: PMI nondisplaced. Regular rate & rhythm. No rubs, gallops or murmurs. Lungs: clear Abdomen: soft, nontender, nondistended. No hepatosplenomegaly. No bruits or masses. Good bowel sounds. Extremities: no cyanosis, clubbing, rash, edema Neuro: alert & oriented x 3, cranial nerves grossly intact. moves all 4 extremities w/o difficulty. Affect pleasant.   No results found for this or any previous visit (from the past 24 hour(s)). No results found.   ASSESSMENT:  1. Chest pain - Two types: one is atypical and appears musculoskeletal. Other is pressure  like concerning for angina although ECGx3 and TnIx1 negative for ischemia.  - No evidence of acute HF or shock or unstable arrhythmia - CAD risk factors: HTN and smoking   PLAN/DISCUSSION:  Admit to cardiology  Trend TnI  ASA 81 mg po qd Lovenox 1 mg/kg q12h NTG infusion  Counseled to quit smoking.   If rules out for MI, then will plan stress/rest myocardial perfusion scan.   Please see orders for other details.    Wandra Mannan, MD Cardiology

## 2015-09-06 ENCOUNTER — Inpatient Hospital Stay (HOSPITAL_COMMUNITY): Payer: Self-pay

## 2015-09-06 ENCOUNTER — Encounter (HOSPITAL_COMMUNITY): Payer: Self-pay | Admitting: Cardiology

## 2015-09-06 DIAGNOSIS — I209 Angina pectoris, unspecified: Secondary | ICD-10-CM

## 2015-09-06 DIAGNOSIS — K219 Gastro-esophageal reflux disease without esophagitis: Secondary | ICD-10-CM

## 2015-09-06 DIAGNOSIS — E785 Hyperlipidemia, unspecified: Secondary | ICD-10-CM

## 2015-09-06 DIAGNOSIS — I10 Essential (primary) hypertension: Secondary | ICD-10-CM | POA: Diagnosis present

## 2015-09-06 DIAGNOSIS — E782 Mixed hyperlipidemia: Secondary | ICD-10-CM | POA: Diagnosis present

## 2015-09-06 DIAGNOSIS — R079 Chest pain, unspecified: Secondary | ICD-10-CM

## 2015-09-06 HISTORY — DX: Hyperlipidemia, unspecified: E78.5

## 2015-09-06 HISTORY — DX: Gastro-esophageal reflux disease without esophagitis: K21.9

## 2015-09-06 LAB — LIPID PANEL
Cholesterol: 229 mg/dL — ABNORMAL HIGH (ref 0–200)
HDL: 34 mg/dL — ABNORMAL LOW (ref >40)
LDL Cholesterol: 130 mg/dL — ABNORMAL HIGH (ref 0–99)
Total CHOL/HDL Ratio: 6.7 RATIO
Triglycerides: 324 mg/dL — ABNORMAL HIGH (ref <150)
VLDL: 65 mg/dL — ABNORMAL HIGH (ref 0–40)

## 2015-09-06 LAB — NM MYOCAR MULTI W/SPECT W/WALL MOTION / EF
Estimated workload: 1 METS
Exercise duration (min): 0 min
Exercise duration (sec): 0 s
MPHR: 167 {beats}/min
Peak HR: 99 {beats}/min
Percent HR: 59 %
Rest HR: 57 {beats}/min

## 2015-09-06 LAB — COMPREHENSIVE METABOLIC PANEL
ALT: 14 U/L (ref 14–54)
AST: 16 U/L (ref 15–41)
Albumin: 3 g/dL — ABNORMAL LOW (ref 3.5–5.0)
Alkaline Phosphatase: 68 U/L (ref 38–126)
Anion gap: 9 (ref 5–15)
BUN: 8 mg/dL (ref 6–20)
CO2: 21 mmol/L — ABNORMAL LOW (ref 22–32)
Calcium: 8.6 mg/dL — ABNORMAL LOW (ref 8.9–10.3)
Chloride: 109 mmol/L (ref 101–111)
Creatinine, Ser: 0.7 mg/dL (ref 0.44–1.00)
GFR calc Af Amer: >60 mL/min (ref >60)
GFR calc non Af Amer: >60 mL/min (ref >60)
Glucose, Bld: 96 mg/dL (ref 65–99)
Potassium: 3.7 mmol/L (ref 3.5–5.1)
Sodium: 139 mmol/L (ref 135–145)
Total Bilirubin: 0.8 mg/dL (ref 0.3–1.2)
Total Protein: 5.5 g/dL — ABNORMAL LOW (ref 6.5–8.1)

## 2015-09-06 LAB — MAGNESIUM: Magnesium: 2.1 mg/dL (ref 1.7–2.4)

## 2015-09-06 LAB — CBC
HCT: 37.1 % (ref 36.0–46.0)
Hemoglobin: 12.2 g/dL (ref 12.0–15.0)
MCH: 28 pg (ref 26.0–34.0)
MCHC: 32.9 g/dL (ref 30.0–36.0)
MCV: 85.1 fL (ref 78.0–100.0)
Platelets: 348 10*3/uL (ref 150–400)
RBC: 4.36 MIL/uL (ref 3.87–5.11)
RDW: 15.1 % (ref 11.5–15.5)
WBC: 11.3 10*3/uL — ABNORMAL HIGH (ref 4.0–10.5)

## 2015-09-06 LAB — MRSA PCR SCREENING: MRSA by PCR: NEGATIVE

## 2015-09-06 LAB — TROPONIN I
Troponin I: <0.03 ng/mL (ref <0.031)
Troponin I: <0.03 ng/mL (ref <0.031)

## 2015-09-06 MED ORDER — ENOXAPARIN SODIUM 60 MG/0.6ML ~~LOC~~ SOLN
1.0000 mg/kg | Freq: Two times a day (BID) | SUBCUTANEOUS | Status: DC
  Administered 2015-09-06 (×2): 55 mg via SUBCUTANEOUS
  Filled 2015-09-06 (×2): qty 0.6

## 2015-09-06 MED ORDER — REGADENOSON 0.4 MG/5ML IV SOLN
0.4000 mg | Freq: Once | INTRAVENOUS | Status: AC
  Administered 2015-09-06: 0.4 mg via INTRAVENOUS
  Filled 2015-09-06: qty 5

## 2015-09-06 MED ORDER — TECHNETIUM TC 99M SESTAMIBI GENERIC - CARDIOLITE
30.0000 | Freq: Once | INTRAVENOUS | Status: AC | PRN
  Administered 2015-09-06: 30 via INTRAVENOUS

## 2015-09-06 MED ORDER — ROSUVASTATIN CALCIUM 5 MG PO TABS: 5.0000 mg | ORAL_TABLET | Freq: Every day | ORAL | Status: DC

## 2015-09-06 MED ORDER — TECHNETIUM TC 99M SESTAMIBI GENERIC - CARDIOLITE
10.0000 | Freq: Once | INTRAVENOUS | Status: AC | PRN
  Administered 2015-09-06: 10 via INTRAVENOUS

## 2015-09-06 MED ORDER — METOPROLOL TARTRATE 25 MG PO TABS: 25.0000 mg | ORAL_TABLET | Freq: Two times a day (BID) | ORAL | Status: DC

## 2015-09-06 MED ORDER — PANTOPRAZOLE SODIUM 40 MG PO TBEC: 40.0000 mg | DELAYED_RELEASE_TABLET | Freq: Every day | ORAL | Status: DC

## 2015-09-06 MED ORDER — NITROGLYCERIN IN D5W 200-5 MCG/ML-% IV SOLN
2.0000 ug/min | INTRAVENOUS | Status: DC
  Administered 2015-09-06: 20 ug/min via INTRAVENOUS

## 2015-09-06 MED ORDER — REGADENOSON 0.4 MG/5ML IV SOLN
INTRAVENOUS | Status: AC
  Filled 2015-09-06: qty 5

## 2015-09-06 MED ORDER — ROSUVASTATIN CALCIUM 10 MG PO TABS
5.0000 mg | ORAL_TABLET | Freq: Every day | ORAL | Status: DC
  Administered 2015-09-06: 5 mg via ORAL
  Filled 2015-09-06: qty 1

## 2015-09-06 MED ORDER — NITROGLYCERIN 0.4 MG SL SUBL: 0.4000 mg | SUBLINGUAL_TABLET | SUBLINGUAL | Status: DC | PRN

## 2015-09-06 NOTE — Progress Notes (Signed)
Pre procedure VS 

## 2015-09-06 NOTE — Discharge Instructions (Signed)

## 2015-09-06 NOTE — Progress Notes (Signed)
   SUBJECTIVE:  Episodic chest pressure  OBJECTIVE:   Vitals:   Filed Vitals:   09/05/15 2323 09/06/15 0044 09/06/15 0541 09/06/15 0735  BP: 142/80 162/99 136/78 128/79  Pulse: 68 63 84 74  Temp: 98.1 F (36.7 C)  97.9 F (36.6 C) 98 F (36.7 C)  TempSrc: Oral  Oral Oral  Resp: 18  20 18   Height: 5' (1.524 m)     Weight: 123 lb 3.2 oz (55.883 kg)  122 lb 14.4 oz (55.747 kg)   SpO2: 99%  98% 98%   I&O's:   Intake/Output Summary (Last 24 hours) at 09/06/15 [id number] Last data filed at 09/06/15 [id number]  Gross per 24 hour  Intake   35.3 ml  Output      0 ml  Net   35.3 ml   TELEMETRY: Reviewed telemetry pt in NSR:     PHYSICAL EXAM General: Well developed, well nourished, in no acute distress Head: Eyes PERRLA, No xanthomas.   Normal cephalic and atramatic  Lungs:   Clear bilaterally to auscultation and percussion. Heart:   HRRR S1 S2 Pulses are 2+ & equal. Abdomen: Bowel sounds are positive, abdomen soft and non-tender without masses Extremities:   No clubbing, cyanosis or edema.  DP +1 Neuro: Alert and oriented X 3. Psych:  Good affect, responds appropriately   LABS: Basic Metabolic Panel:  Recent Labs  09/06/15 0025  NA 139  K 3.7  CL 109  CO2 21*  GLUCOSE 96  BUN 8  CREATININE 0.70  CALCIUM 8.6*  MG 2.1   Liver Function Tests:  Recent Labs  09/06/15 0025  AST 16  ALT 14  ALKPHOS 68  BILITOT 0.8  PROT 5.5*  ALBUMIN 3.0*   No results for input(s): LIPASE, AMYLASE in the last 72 hours. CBC:  Recent Labs  09/06/15 0025  WBC 11.3*  HGB 12.2  HCT 37.1  MCV 85.1  PLT 348   Cardiac Enzymes:  Recent Labs  09/06/15 0025 09/06/15 0643  TROPONINI <0.03 <0.03   BNP: Invalid input(s): POCBNP D-Dimer: No results for input(s): DDIMER in the last 72 hours. Hemoglobin A1C: No results for input(s): HGBA1C in the last 72 hours. Fasting Lipid Panel:  Recent Labs  09/06/15 0643  CHOL 229*  HDL 34*  LDLCALC 130*  TRIG 324*  CHOLHDL 6.7    Thyroid Function Tests: No results for input(s): TSH, T4TOTAL, T3FREE, THYROIDAB in the last 72 hours.  Invalid input(s): FREET3 Anemia Panel: No results for input(s): VITAMINB12, FOLATE, FERRITIN, TIBC, IRON, RETICCTPCT in the last 72 hours. Coag Panel:   No results found for: INR, PROTIME  RADIOLOGY: No results found.  ASSESSMENT/PLAN:  1. Chest pain - Two types: one is atypical and appears musculoskeletal. Other is pressure like concerning for angina although ECGx3 and TnIx2 negative for ischemia.  - No evidence of acute HF - CAD risk factors: HTN, family history of CAD, hyperlipidemia and smoking - Plan nuclear stress test today to rule out ischemia.  - check 2D echo to assess LVF - check d-dimer 2.  Hyperlipidemia - Total cholesterol 130 and TAGs 324 - will need to be treated.  Start Crestor 5mg  daily.  3.  HTN - BP controlled on BB and diuretic 4.  GERD - this may be etiology of chest discomfort.  Add Protonix 40mg  daily.     Sueanne Margarita, MD  09/06/2015  9:27 AM

## 2015-09-06 NOTE — Progress Notes (Signed)
5 mis, Hao PA at bedside, pt reports "feeling hot" denies CP, states SOB has stopped.

## 2015-09-06 NOTE — Discharge Summary (Signed)
Discharge Summary    Patient ID: Erin [name],  MRN: [id number], DOB/AGE: 09/24/1960 55 y.o.  Admit date: 09/05/2015 Discharge date: 09/06/2015  Primary Care Provider: Maurice Small D Primary Cardiologist: Dr. Radford Pax  Discharge Diagnoses    Principal Problem:   Chest pain Active Problems:   Benign essential HTN   Hyperlipidemia   GERD (gastroesophageal reflux disease)   Allergies No Known Allergies  Diagnostic Studies/Procedures    Myoview 09/06/2015  IMPRESSION: 1. Small, mild reversible defect is noted involving the mid to distal anterior wall.  2. Decreased inferior wall and septal motion.  3. Left ventricular ejection fraction 49%  4. Low to intermediate-risk stress test findings*.  _____________   History of Present Illness     55 yo woman, active smoker and history of HTN, transferred to [place] from [facility] ER. She reports having sharp pain on the right side of her neck and face last night but then slept well all night. Woke up after 8 am as normal, had her coffee, felt skipping beat and then chest pain. CP is in the left lower parasternal region, waxing and waning adn describes two kinds of pain. One is point tenderness in the left lower parasternum and other is like pressure radiating across her left breast. No dizziness, SOB, fever, cough, syncope, bleeding, diaphoresis. No known CAD, MI, HF, CVA.   At the outside ER had negative TnI, EKGx2 negative for ischemia, CT head and CXR unremarkable. Her SBP was in 160-170's. She was treated with ASA 325 ms po x1, Morphine, NTG drip.   Here she continues to have pressure and tenderness in the left lower parasternum. BP 166/99. NSR on tele. Repeat ECG here is normal.   She reports that she is off of her HCTZ for about a year. But still on Atenolol.  Hospital Course     Patient was seen on 09/06/2015, she was complaining about 2 different type of chest pain, it was felt at least one portion of her chest  discomfort was atypical with the other portion is concerning for angina. She was started on Protonix. Lipid panel obtained on the same day showed cholesterol 229, triglyceride 324, HDL 34, LDL 130. She was started on low-dose Crestor. She also underwent stress test on 09/06/2015 which showed low to intermediate risk study, EF 49%, decreased inferior wall and septal motion, small and a mild reversible defect is noted involving the mid to distal anterior wall.  The Myoview has been reviewed by Dr. Marlou Porch, who advised medical treatment for now. I will arrange follow-up in 2 months to reassess, if she has recurrent chest discomfort, mainly need to consider cardiac catheterization. She lives in [place] but prefer to followup in [place] area. I will arrange followup with Dr. Radford Pax.  _____________  Discharge Vitals Blood pressure 135/71, pulse 63, temperature 98.3 F (36.8 C), temperature source Oral, resp. rate 20, height 5' (1.524 m), weight 122 lb 14.4 oz (55.747 kg), SpO2 98 %.  Filed Weights   09/05/15 2323 09/06/15 0541  Weight: 123 lb 3.2 oz (55.883 kg) 122 lb 14.4 oz (55.747 kg)    Labs & Radiologic Studies     CBC  Recent Labs  09/06/15 0025  WBC 11.3*  HGB 12.2  HCT 37.1  MCV 85.1  PLT 0000000   Basic Metabolic Panel  Recent Labs  09/06/15 0025  NA 139  K 3.7  CL 109  CO2 21*  GLUCOSE 96  BUN 8  CREATININE 0.70  CALCIUM 8.6*  MG 2.1   Liver Function Tests  Recent Labs  09/06/15 0025  AST 16  ALT 14  ALKPHOS 68  BILITOT 0.8  PROT 5.5*  ALBUMIN 3.0*   Cardiac Enzymes  Recent Labs  09/06/15 0025 09/06/15 0643  TROPONINI <0.03 <0.03   Fasting Lipid Panel  Recent Labs  09/06/15 0643  CHOL 229*  HDL 34*  LDLCALC 130*  TRIG 324*  CHOLHDL 6.7    Nm Myocar Multi W/spect W/wall Motion / Ef  09/06/2015  CLINICAL DATA:  Chest pain. EXAM: MYOCARDIAL IMAGING WITH SPECT (REST AND PHARMACOLOGIC-STRESS) GATED LEFT VENTRICULAR WALL MOTION STUDY LEFT  VENTRICULAR EJECTION FRACTION TECHNIQUE: Standard myocardial SPECT imaging was performed after resting intravenous injection of 10 mCi Tc-56m sestamibi. Subsequently, intravenous infusion of Lexiscan was performed under the supervision of the Cardiology staff. At peak effect of the drug, 30 mCi Tc-51m sestamibi was injected intravenously and standard myocardial SPECT imaging was performed. Quantitative gated imaging was also performed to evaluate left ventricular wall motion, and estimate left ventricular ejection fraction. COMPARISON:  None. FINDINGS: Perfusion: There is a small mild reversible defect involving the mid to distal anterior lateral wall. There is no corresponding wall motion abnormality. Wall Motion: There is moderate septal and inferior wall hypokinesis. Left Ventricular Ejection Fraction: 49 % End diastolic volume 63 ml End systolic volume 32 ml IMPRESSION: 1. Small, mild reversible defect is noted involving the mid to distal anterior wall. 2. Decreased inferior wall and septal motion. 3. Left ventricular ejection fraction 49% 4. Low to intermediate-risk stress test findings*. *2012 Appropriate Use Criteria for Coronary Revascularization Focused Update: J Am Coll Cardiol. N6492421. http://content.airportbarriers.com.aspx?articleid=1201161 Electronically Signed   By: Kerby Moors M.D.   On: 09/06/2015 14:12    Disposition   Pt is being discharged home today in good condition.  Follow-up Plans & Appointments    Follow-up Information    Follow up with Sueanne Margarita, MD.   Specialty:  Cardiology   Why:  cardiology office scheduler will contact you to arrange followup, please give [place] a call if you do not hear from [place] in 3 business days   Contact information:   [address] [place] [address] [phone]      Discharge Instructions    Diet - low sodium heart healthy    Complete by:  As directed      Increase activity slowly    Complete by:  As  directed            Discharge Medications   Current Discharge Medication List    START taking these medications   Details  metoprolol tartrate (LOPRESSOR) 25 MG tablet Take 1 tablet (25 mg total) by mouth 2 (two) times daily. Qty: 60 tablet, Refills: 11    nitroGLYCERIN (NITROSTAT) 0.4 MG SL tablet Place 1 tablet (0.4 mg total) under the tongue every 5 (five) minutes x 3 doses as needed for chest pain. Qty: 25 tablet, Refills: 3    pantoprazole (PROTONIX) 40 MG tablet Take 1 tablet (40 mg total) by mouth daily. Qty: 90 tablet, Refills: 3    rosuvastatin (CRESTOR) 5 MG tablet Take 1 tablet (5 mg total) by mouth daily. Qty: 90 tablet, Refills: 3      CONTINUE these medications which have NOT CHANGED   Details  acetaminophen (TYLENOL) 500 MG tablet Take 1,000 mg by mouth every 8 (eight) hours as needed for mild pain or moderate pain.    aspirin 81 MG  tablet Take 81 mg by mouth daily.    HYDROcodone-acetaminophen (NORCO) 5-325 MG per tablet 1-2 tabs po q6 hours prn pain Qty: 40 tablet, Refills: 0      STOP taking these medications     atenolol (TENORMIN) 25 MG tablet            Outstanding Labs/Studies   None  Duration of Discharge Encounter   Greater than 30 minutes including physician time.  Signed, Almyra Deforest PA-C 09/06/2015, 5:46 PM

## 2015-09-06 NOTE — Progress Notes (Signed)
7 mins, Hao PA at bedside, pt denies CP, SOb, or feeling hot Test ended

## 2015-09-06 NOTE — Progress Notes (Signed)
    Personally reviewed nuclear stress images. Appear low risk, no significant evidence of ischemia identified.  I would become trouble with her being discharged with continued medical management, primary prevention, troponins have been normal. EKG unremarkable.  We will have her follow-up in 2 months with Dr. Radford Pax or APP. If symptoms progress or become more worrisome, diagnostic cardiac catheterization can take place.  Candee Furbish, MD

## 2015-09-06 NOTE — Progress Notes (Signed)
3 mins, hao PA at bedside, pt reports ongoing SOB, continues to deny CP

## 2015-09-06 NOTE — Progress Notes (Signed)
1 min, Hao PA at bedside pt reports some SOB. Pt denies CP

## 2015-09-07 DIAGNOSIS — R0789 Other chest pain: Secondary | ICD-10-CM | POA: Insufficient documentation

## 2015-09-07 DIAGNOSIS — R079 Chest pain, unspecified: Secondary | ICD-10-CM | POA: Insufficient documentation

## 2015-09-07 LAB — HEMOGLOBIN A1C
Hgb A1c MFr Bld: 5.7 % — ABNORMAL HIGH (ref 4.8–5.6)
Mean Plasma Glucose: 117 mg/dL

## 2015-09-07 MED FILL — Nitroglycerin IV Soln 200 MCG/ML in D5W: INTRAVENOUS | Qty: 250 | Status: AC

## 2015-11-02 ENCOUNTER — Encounter: Payer: Self-pay | Admitting: Cardiology

## 2015-11-12 ENCOUNTER — Ambulatory Visit: Payer: Self-pay | Admitting: Cardiology

## 2015-12-04 ENCOUNTER — Ambulatory Visit (INDEPENDENT_AMBULATORY_CARE_PROVIDER_SITE_OTHER): Payer: PRIVATE HEALTH INSURANCE | Admitting: Cardiology

## 2015-12-04 ENCOUNTER — Encounter: Payer: Self-pay | Admitting: Cardiology

## 2015-12-04 VITALS — BP 178/120 | HR 64 | Ht 60.0 in | Wt 120.6 lb

## 2015-12-04 DIAGNOSIS — E785 Hyperlipidemia, unspecified: Secondary | ICD-10-CM

## 2015-12-04 MED ORDER — LOVASTATIN 20 MG PO TABS: 20.0000 mg | ORAL_TABLET | Freq: Every day | ORAL | 6 refills | Status: DC

## 2015-12-04 MED ORDER — ISOSORBIDE MONONITRATE ER 30 MG PO TB24: 30.0000 mg | ORAL_TABLET | Freq: Every day | ORAL | 6 refills | Status: DC

## 2015-12-04 NOTE — Progress Notes (Signed)
12/07/2015 Alphonsus Sias   1961/04/04  [id number]  Primary Physician Jonathon Bellows, MD Primary Cardiologist: Dr. Radford Pax   Reason for Visit/CC: [facility] f/u for CP  HPI:  The patient is a 55 year old female, active smoker with a history of hypertension, who presents to clinic today for post hospital follow-up. She was recently treated at [facility] for chest pain and hypertension. Systolic blood pressures were in the 160s to 170s. EKG was negative for ischemia. She was treated with aspirin 325 mg, morphine and nitroglycerin drip. Cardiac enzymes were cycled and were negative. Lipid panel showed cholesterol at 229, triglyceride 324, HDL 34, LDL 130. She was started on low-dose Crestor. She also underwent stress test on 09/06/2015 which showed low to intermediate risk study, EF 49%, decreased inferior wall and septal motion, small and a mild reversible defect is noted involving the mid to distal anterior wall. The Myoview was reviewed by Dr. Marlou Porch, who advised medical treatment for now. She was discharged home on aspirin, Metroprolol and Crestor.  She presents back to clinic today for follow-up. Her blood pressure is markedly elevated at 178/120. She is currently asymptomatic, without chest pain, dyspnea, headache, visual changes, dizziness, lightheadedness, syncope/near-syncope. Unfortunately, she continues to smoke. She continues to have intermittent chest discomfort. Described as substernal chest pressure. Occurs at rest. Mainly occurs at night when she when first going to bed. Does not always occur with exertion. No significant dyspnea. It is responsive to sublingual nitroglycerin. We reviewed her medications together and it appears that she has been taking her medications incorrectly. She has only been taking Metroprolol once a day instead of twice daily. She has also had difficulties obtaining several medications due to lack of insurance. She is unable to afford Protonix and  Crestor.  Current Outpatient Prescriptions  Medication Sig Dispense Refill  . acetaminophen (TYLENOL) 500 MG tablet Take 1,000 mg by mouth every 8 (eight) hours as needed for mild pain or moderate pain.    [name] Kitchen aspirin 81 MG tablet Take 81 mg by mouth daily.    [name] Kitchen HYDROcodone-acetaminophen (NORCO) 5-325 MG per tablet 1-2 tabs po q6 hours prn pain 40 tablet 0  . metoprolol tartrate (LOPRESSOR) 25 MG tablet Take 1 tablet (25 mg total) by mouth 2 (two) times daily. 60 tablet 11  . nitroGLYCERIN (NITROSTAT) 0.4 MG SL tablet Place 1 tablet (0.4 mg total) under the tongue every 5 (five) minutes x 3 doses as needed for chest pain. 25 tablet 3  . pantoprazole (PROTONIX) 40 MG tablet Take 1 tablet (40 mg total) by mouth daily. 90 tablet 3  . isosorbide mononitrate (IMDUR) 30 MG 24 hr tablet Take 1 tablet (30 mg total) by mouth daily. 30 tablet 6  . lovastatin (MEVACOR) 20 MG tablet Take 1 tablet (20 mg total) by mouth at bedtime. 30 tablet 6   No current facility-administered medications for this visit.     No Known Allergies  Social History   Social History  . Marital status: Widowed    Spouse name: N/A  . Number of children: N/A  . Years of education: N/A   Occupational History  . Not on file.   Social History Main Topics  . Smoking status: Current Every Day Smoker    Packs/day: 0.50    Years: 15.00    Types: Cigarettes  . Smokeless tobacco: Never Used  . Alcohol use Yes     Comment: OCCASSIONAL BEER  . Drug use: Unknown  . Sexual activity: Yes  Birth control/ protection: Surgical   Other Topics Concern  . Not on file   Social History Narrative  . No narrative on file     Review of Systems: General: negative for chills, fever, night sweats or weight changes.  Cardiovascular: negative for chest pain, dyspnea on exertion, edema, orthopnea, palpitations, paroxysmal nocturnal dyspnea or shortness of breath Dermatological: negative for rash Respiratory: negative for cough or  wheezing Urologic: negative for hematuria Abdominal: negative for nausea, vomiting, diarrhea, bright red blood per rectum, melena, or hematemesis Neurologic: negative for visual changes, syncope, or dizziness All other systems reviewed and are otherwise negative except as noted above.    Blood pressure (!) 178/120, pulse 64, height 5' (1.524 m), weight 120 lb 9.6 oz (54.7 kg).  General appearance: alert, cooperative and no distress Neck: no carotid bruit and no JVD Lungs: clear to auscultation bilaterally Heart: regular rate and rhythm, S1, S2 normal, no murmur, click, rub or gallop Extremities: no LEE Pulses: 2+ and symmetric Skin: warm and dry Neurologic: Grossly normal   ASSESSMENT AND PLAN:   1. Chest Pain: Still with mixed typical and atypical features. Recent NST was low to intermediate risk study, EF 49%, decreased inferior wall and septal motion, small and a mild reversible defect is noted involving the mid to distal anterior wall. Medical therapy was recommended. She continues to have chest discomfort but this is also in the setting of markedly elevated blood pressure. She has also been taking her medications incorrectly and has been unable to afford several other of her medications, due to lack of insurance. She has only been taking her Metroprolol once a day instead of twice daily. She was educated that this medicine needs to be taken twice daily. We will plan to have her take 25 mg twice a day. We will also add Imdur, 30 mg daily ($4 list at [facility]). Metroprolol tartrate is also now covered on $4 list at [facility]. Since she cannot afford Crestor, we will change this to lovastatin which is covered on the $4 medication list at [facility]. She was encouraged to stop smoking. We will have her follow-up in 2 weeks for repeat blood pressure check and for further medication adjustment.   Lyda Jester PA-C 12/07/2015 6:10 PM

## 2015-12-04 NOTE — Patient Instructions (Signed)
Medication Instructions:  STOP Crestor Start Lovastatin 20mg  at bedtime daily. START Imdur 30mg  once a day. Make sure to take Metoprolol 25mg  twice a day.  Labwork: 01/15/16 fasting cholesterol panel   Testing/Procedures: none  Follow-Up: Patient needs 2 week followup in [place] with Estella Husk or Andres Shad.  Any Other Special Instructions Will Be Listed Below (If Applicable).     If you need a refill on your cardiac medications before your next appointment, please call your pharmacy.

## 2015-12-22 ENCOUNTER — Encounter: Payer: Self-pay | Admitting: Adult Health

## 2015-12-22 ENCOUNTER — Ambulatory Visit (INDEPENDENT_AMBULATORY_CARE_PROVIDER_SITE_OTHER): Payer: PRIVATE HEALTH INSURANCE | Admitting: Adult Health

## 2015-12-22 VITALS — BP 166/100 | HR 80 | Ht 60.0 in | Wt 119.0 lb

## 2015-12-22 DIAGNOSIS — I10 Essential (primary) hypertension: Secondary | ICD-10-CM | POA: Diagnosis not present

## 2015-12-22 DIAGNOSIS — E785 Hyperlipidemia, unspecified: Secondary | ICD-10-CM

## 2015-12-22 DIAGNOSIS — R072 Precordial pain: Secondary | ICD-10-CM | POA: Diagnosis not present

## 2015-12-22 MED ORDER — LISINOPRIL-HYDROCHLOROTHIAZIDE 10-12.5 MG PO TABS: 1.0000 | ORAL_TABLET | Freq: Every day | ORAL | 6 refills | Status: DC

## 2015-12-22 NOTE — Progress Notes (Signed)
Cardiology Office Note   Date:  12/22/2015   ID:  Erin [name], DOB 1961-05-01, MRN [id number]  PCP:  Jonathon Bellows, MD  Cardiologist:  Fransico Him MD/ Jory Sims, NP   No chief complaint on file.     History of Present Illness: Erin [name] is a 55 y.o. female who presents for ongoing assessment and management of hypertension. Last office visit the patient was not able to afford antihypertensive medications or statin therapy. She has since gotten insurance.   She continues to be hypertensive and has been seen at [facility] hospital for hypertensive urgency with chest pain.[name] Kitchenshe was rule out for ACS. She had MPI which revealed loaded intermediate risk. She did have decreased inferior wall and septal motion with a small mild reversible defect involving the mid to distal anterior wall. EF was 49%.she was advised medical management.  She comes today hypertensive with a blood pressure 166/100, heart rate 80, she has taken metoprolol 25 mg twice a day but has stopped taking isosorbide and lovastatin as she is unable to afford them. She has been taking over the counter fish oil.  She continues to complain of some chest pressure and headache when blood pressure is elevated.most recent labs in April of 2017 demonstrated creatinine of 0.70, potassium 3.7 sodium 139 heart 109 BUN 8. Cholesterol 229 triglycerides 324 HDL 34 LDL 130.  Past Medical History:  Diagnosis Date  . Benign essential HTN 09/06/2015  . GERD (gastroesophageal reflux disease)   . GERD (gastroesophageal reflux disease) 09/06/2015  . Hyperlipidemia 09/06/2015  . Hypertension   . Wears dentures    top  . Wears glasses     Past Surgical History:  Procedure Laterality Date  . APPENDECTOMY    . CHOLECYSTECTOMY  1984  . FINGER ARTHROPLASTY Left 05/14/2013   Procedure: LEFT THUMB CARPAL METACARPAL ARTHROPLASTY;  Surgeon: Tennis Must, MD;  Location: [place];  Service: Orthopedics;  Laterality: Left;  . TUBAL  LIGATION  1987  . VAGINAL HYSTERECTOMY  2009     Current Outpatient Prescriptions  Medication Sig Dispense Refill  . acetaminophen (TYLENOL) 500 MG tablet Take 1,000 mg by mouth every 8 (eight) hours as needed for mild pain or moderate pain.    [name] Kitchen aspirin 81 MG tablet Take 81 mg by mouth daily.    . calcium carbonate (TUMS EX) 750 MG chewable tablet Chew 1 tablet by mouth as needed for heartburn.    . isosorbide mononitrate (IMDUR) 30 MG 24 hr tablet Take 1 tablet (30 mg total) by mouth daily. 30 tablet 6  . lovastatin (MEVACOR) 20 MG tablet Take 1 tablet (20 mg total) by mouth at bedtime. 30 tablet 6  . metoprolol tartrate (LOPRESSOR) 25 MG tablet Take 1 tablet (25 mg total) by mouth 2 (two) times daily. 60 tablet 11  . nitroGLYCERIN (NITROSTAT) 0.4 MG SL tablet Place 1 tablet (0.4 mg total) under the tongue every 5 (five) minutes x 3 doses as needed for chest pain. 25 tablet 3  . lisinopril-hydrochlorothiazide (ZESTORETIC) 10-12.5 MG tablet Take 1 tablet by mouth daily. 30 tablet 6   No current facility-administered medications for this visit.     Allergies:   Review of patient's allergies indicates no known allergies.    Social History:  The patient  reports that she has been smoking Cigarettes.  She has a 7.50 pack-year smoking history. She has never used smokeless tobacco. She reports that she drinks alcohol. She reports that she does not use  drugs.   Family History:  The patient's family history includes Diabetes in her mother; Heart failure in her other; Hypertension in her mother; Stroke in her mother.    ROS: All other systems are reviewed and negative. Unless otherwise mentioned in H&P    PHYSICAL EXAM: VS:  BP (!) 166/100   Pulse 80   Ht 5' (1.524 m)   Wt 119 lb (54 kg)   SpO2 98%   BMI 23.24 kg/m  , BMI Body mass index is 23.24 kg/m. GEN: Well nourished, well developed, in no acute distress  HEENT: normal  Neck: no JVD, carotid bruits, or masses Cardiac: RRR;S4  murmur is auscultated  rubs, or gallops,no edema  Respiratory:  clear to auscultation bilaterally, normal work of breathing GI: soft, nontender, nondistended, + BS MS: no deformity or atrophy  Skin: warm and dry, no rash Neuro:  Strength and sensation are intact Psych: euthymic mood, full affect   Recent Labs: 09/06/2015: ALT 14; BUN 8; Creatinine, Ser 0.70; Hemoglobin 12.2; Magnesium 2.1; Platelets 348; Potassium 3.7; Sodium 139    Lipid Panel    Component Value Date/Time   CHOL 229 (H) 09/06/2015 0643   TRIG 324 (H) 09/06/2015 0643   HDL 34 (L) 09/06/2015 0643   CHOLHDL 6.7 09/06/2015 0643   VLDL 65 (H) 09/06/2015 0643   LDLCALC 130 (H) 09/06/2015 0643      Wt Readings from Last 3 Encounters:  12/22/15 119 lb (54 kg)  12/04/15 120 lb 9.6 oz (54.7 kg)  09/06/15 122 lb 14.4 oz (55.7 kg)      ASSESSMENT AND PLAN:  1.  Uncontrolled hypertension: we'll begin lisinopril 10/12.5 mg daily, echocardiogram will be ordered for evaluation of LV function, structural heart disease, and diastolic disease. She will come back to have a blood pressure check in one week for evaluation of blood pressure control on new medication. She'll be seen in one month after echo was completed. She will need a BMET.  2. Hypercholesterolemia:we'll try and reinstitution statin therapy next visit she is currently taking fish oil tablets. She is avoiding fried foods and watching her diet closely.  3. Chest pain:she was ruled out for ACS with an intermediate risk MPI. We'll continue medical therapy for now.   Current medicines are reviewed at length with the patient today.      Disposition:   FU with one week for blood pressure check one month for followup appointment. Signed, Jory Sims, NP  12/22/2015 5:35 PM    [name] [address] [place], [place] [address] Phone: [phone]; Fax: [phone]

## 2015-12-22 NOTE — Patient Instructions (Addendum)
Your physician recommends that you schedule a follow-up appointment in:  1 month with Arnold Long NP    START lisinopril-hctz 10-12.5 mg daily  Please return for BP check in 1 week   Your physician has requested that you have an echocardiogram. Echocardiography is a painless test that uses sound waves to create images of your heart. It provides your doctor with information about the size and shape of your heart and how well your heart's chambers and valves are working. This procedure takes approximately one hour. There are no restrictions for this procedure.         Thank you for choosing [facility] !

## 2015-12-22 NOTE — Progress Notes (Signed)
Name: Erin [name]    DOB: [birth year]  Age: 55 y.o.  MR#: [id number]       PCP:  Jonathon Bellows, MD      Insurance: Payor: MEDCOST / Plan: MEDCOST / Product Type: *No Product type* /   CC:   No chief complaint on file.   VS Vitals:   12/22/15 1500  BP: (!) 166/100  Pulse: 80  SpO2: 98%  Weight: 119 lb (54 kg)  Height: 5' (1.524 m)    Weights Current Weight  12/22/15 119 lb (54 kg)  12/04/15 120 lb 9.6 oz (54.7 kg)  09/06/15 122 lb 14.4 oz (55.7 kg)    Blood Pressure  BP Readings from Last 3 Encounters:  12/22/15 (!) 166/100  12/04/15 (!) 178/120  09/06/15 135/71     Admit date:  (Not on file) Last encounter with RMR:  Visit date not found   Allergy Review of patient's allergies indicates no known allergies.  Current Outpatient Prescriptions  Medication Sig Dispense Refill  . acetaminophen (TYLENOL) 500 MG tablet Take 1,000 mg by mouth every 8 (eight) hours as needed for mild pain or moderate pain.    [name] Kitchen aspirin 81 MG tablet Take 81 mg by mouth daily.    . calcium carbonate (TUMS EX) 750 MG chewable tablet Chew 1 tablet by mouth as needed for heartburn.    . isosorbide mononitrate (IMDUR) 30 MG 24 hr tablet Take 1 tablet (30 mg total) by mouth daily. 30 tablet 6  . lovastatin (MEVACOR) 20 MG tablet Take 1 tablet (20 mg total) by mouth at bedtime. 30 tablet 6  . metoprolol tartrate (LOPRESSOR) 25 MG tablet Take 1 tablet (25 mg total) by mouth 2 (two) times daily. 60 tablet 11  . nitroGLYCERIN (NITROSTAT) 0.4 MG SL tablet Place 1 tablet (0.4 mg total) under the tongue every 5 (five) minutes x 3 doses as needed for chest pain. 25 tablet 3   No current facility-administered medications for this visit.     Discontinued Meds:    Medications Discontinued During This Encounter  Medication Reason  . HYDROcodone-acetaminophen (NORCO) 5-325 MG per tablet Error  . pantoprazole (PROTONIX) 40 MG tablet Error    Patient Active Problem List   Diagnosis Date Noted  . Pain in the  chest   . Benign essential HTN 09/06/2015  . Hyperlipidemia 09/06/2015  . GERD (gastroesophageal reflux disease) 09/06/2015  . Chest pain 09/06/2015    LABS    Component Value Date/Time   NA 139 09/06/2015 0025   NA 140 05/10/2013 1600   NA 136 01/01/2012 0840   K 3.7 09/06/2015 0025   K 4.2 05/10/2013 1600   K 3.4 (L) 01/01/2012 0840   CL 109 09/06/2015 0025   CL 104 05/10/2013 1600   CL 100 01/01/2012 0840   CO2 21 (L) 09/06/2015 0025   CO2 24 05/10/2013 1600   CO2 25 01/01/2012 0840   GLUCOSE 96 09/06/2015 0025   GLUCOSE 89 05/10/2013 1600   GLUCOSE 89 01/01/2012 0840   BUN 8 09/06/2015 0025   BUN 10 05/10/2013 1600   BUN 10 01/01/2012 0840   CREATININE 0.70 09/06/2015 0025   CREATININE 0.68 05/10/2013 1600   CREATININE 0.77 01/01/2012 0840   CALCIUM 8.6 (L) 09/06/2015 0025   CALCIUM 9.2 05/10/2013 1600   CALCIUM 9.9 01/01/2012 0840   GFRNONAA >60 09/06/2015 0025   GFRNONAA >90 05/10/2013 1600   GFRNONAA >90 01/01/2012 0840   GFRAA >60 09/06/2015 0025  GFRAA >90 05/10/2013 1600   GFRAA >90 01/01/2012 0840   CMP     Component Value Date/Time   NA 139 09/06/2015 0025   K 3.7 09/06/2015 0025   CL 109 09/06/2015 0025   CO2 21 (L) 09/06/2015 0025   GLUCOSE 96 09/06/2015 0025   BUN 8 09/06/2015 0025   CREATININE 0.70 09/06/2015 0025   CALCIUM 8.6 (L) 09/06/2015 0025   PROT 5.5 (L) 09/06/2015 0025   ALBUMIN 3.0 (L) 09/06/2015 0025   AST 16 09/06/2015 0025   ALT 14 09/06/2015 0025   ALKPHOS 68 09/06/2015 0025   BILITOT 0.8 09/06/2015 0025   GFRNONAA >60 09/06/2015 0025   GFRAA >60 09/06/2015 0025       Component Value Date/Time   WBC 11.3 (H) 09/06/2015 0025   WBC 10.1 01/01/2012 0840   WBC 12.0 (H) 08/10/2007 0525   HGB 12.2 09/06/2015 0025   HGB 14.7 05/14/2013 0925   HGB 14.1 01/01/2012 0840   HCT 37.1 09/06/2015 0025   HCT 41.0 01/01/2012 0840   HCT 32.2 (L) 08/10/2007 0525   MCV 85.1 09/06/2015 0025   MCV 82.7 01/01/2012 0840   MCV 86.9  08/10/2007 0525    Lipid Panel     Component Value Date/Time   CHOL 229 (H) 09/06/2015 0643   TRIG 324 (H) 09/06/2015 0643   HDL 34 (L) 09/06/2015 0643   CHOLHDL 6.7 09/06/2015 0643   VLDL 65 (H) 09/06/2015 0643   LDLCALC 130 (H) 09/06/2015 0643    ABG No results found for: PHART, PCO2ART, PO2ART, HCO3, TCO2, ACIDBASEDEF, O2SAT   No results found for: TSH BNP (last 3 results) No results for input(s): BNP in the last 8760 hours.  ProBNP (last 3 results) No results for input(s): PROBNP in the last 8760 hours.  Cardiac Panel (last 3 results) No results for input(s): CKTOTAL, CKMB, TROPONINI, RELINDX in the last 72 hours.  Iron/TIBC/Ferritin/ %Sat No results found for: IRON, TIBC, FERRITIN, IRONPCTSAT   EKG Orders placed or performed during the hospital encounter of 09/05/15  . EKG 12-Lead  . EKG 12-Lead     Prior Assessment and Plan Problem List as of 12/22/2015 Reviewed: 12/07/2015  6:19 PM by Lyda Jester, PA-C     Cardiovascular and Mediastinum   Benign essential HTN     Digestive   GERD (gastroesophageal reflux disease)     Other   Hyperlipidemia   Chest pain   Pain in the chest       Imaging: No results found.

## 2015-12-29 ENCOUNTER — Ambulatory Visit (INDEPENDENT_AMBULATORY_CARE_PROVIDER_SITE_OTHER): Payer: PRIVATE HEALTH INSURANCE

## 2015-12-29 VITALS — BP 138/80 | HR 89 | Ht 60.0 in | Wt 118.0 lb

## 2015-12-29 DIAGNOSIS — I10 Essential (primary) hypertension: Secondary | ICD-10-CM

## 2015-12-29 NOTE — Patient Instructions (Signed)
Continue medications as directed unless we call you    Thanks for choosing [facility]!!!

## 2015-12-29 NOTE — Progress Notes (Signed)
No changes in medication regimen. BP good

## 2016-01-01 ENCOUNTER — Ambulatory Visit (HOSPITAL_COMMUNITY)
Admission: RE | Admit: 2016-01-01 | Discharge: 2016-01-01 | Disposition: A | Discharge status: Home / Self Care | Payer: PRIVATE HEALTH INSURANCE | Source: Ambulatory Visit | Attending: Adult Health | Admitting: Adult Health

## 2016-01-01 DIAGNOSIS — I340 Nonrheumatic mitral (valve) insufficiency: Secondary | ICD-10-CM | POA: Insufficient documentation

## 2016-01-01 DIAGNOSIS — E785 Hyperlipidemia, unspecified: Secondary | ICD-10-CM | POA: Diagnosis not present

## 2016-01-01 DIAGNOSIS — I10 Essential (primary) hypertension: Secondary | ICD-10-CM | POA: Diagnosis not present

## 2016-01-01 DIAGNOSIS — K219 Gastro-esophageal reflux disease without esophagitis: Secondary | ICD-10-CM | POA: Diagnosis not present

## 2016-01-01 LAB — ECHOCARDIOGRAM COMPLETE
E decel time: 225 msec
E/e' ratio: 12.47
FS: 30 % (ref 28–44)
IVS/LV PW RATIO, ED: 0.99
LA ID, A-P, ES: 34 mm
LA diam end sys: 34 mm
LA diam index: 2.25 cm/m2
LA vol A4C: 35.4 ml
LA vol index: 25.2 mL/m2
LA vol: 38.1 mL
LV E/e' medial: 12.47
LV E/e'average: 12.47
LV PW d: 11.1 mm — AB (ref 0.6–1.1)
LV dias vol index: 33 mL/m2
LV dias vol: 49 mL (ref 46–106)
LV e' LATERAL: 6.85 cm/s
LV sys vol index: 14 mL/m2
LV sys vol: 22 mL (ref 14–42)
LVOT SV: 53 mL
LVOT VTI: 18.5 cm
LVOT area: 2.84 cm2
LVOT diameter: 19 mm
LVOT peak grad rest: 3 mmHg
LVOT peak vel: 88.7 cm/s
Lateral S' vel: 11.4 cm/s
MV Dec: 225
MV Peak grad: 3 mmHg
MV pk A vel: 80 m/s
MV pk E vel: 85.4 m/s
RV sys press: 28 mmHg
Reg peak vel: 250 cm/s
Simpson's disk: 56
Stroke v: 28 ml
TAPSE: 20 mm
TDI e' lateral: 6.85
TDI e' medial: 5.87
TR max vel: 250 cm/s

## 2016-01-01 NOTE — Progress Notes (Signed)
*  PRELIMINARY RESULTS* Echocardiogram 2D Echocardiogram has been performed.  Erin [name] 01/01/2016, 9:04 AM

## 2016-01-15 ENCOUNTER — Other Ambulatory Visit: Payer: PRIVATE HEALTH INSURANCE | Admitting: *Deleted

## 2016-01-15 ENCOUNTER — Encounter (INDEPENDENT_AMBULATORY_CARE_PROVIDER_SITE_OTHER): Payer: Self-pay

## 2016-01-15 DIAGNOSIS — E785 Hyperlipidemia, unspecified: Secondary | ICD-10-CM

## 2016-01-15 LAB — HEPATIC FUNCTION PANEL
ALT: 10 U/L (ref 6–29)
AST: 13 U/L (ref 10–35)
Albumin: 4.1 g/dL (ref 3.6–5.1)
Alkaline Phosphatase: 72 U/L (ref 33–130)
Bilirubin, Direct: 0.1 mg/dL (ref <=0.2)
Indirect Bilirubin: 0.4 mg/dL (ref 0.2–1.2)
Total Bilirubin: 0.5 mg/dL (ref 0.2–1.2)
Total Protein: 6.8 g/dL (ref 6.1–8.1)

## 2016-01-15 LAB — LIPID PANEL
Cholesterol: 273 mg/dL — ABNORMAL HIGH (ref 125–200)
HDL: 53 mg/dL (ref >=46)
LDL Cholesterol: 146 mg/dL — ABNORMAL HIGH (ref <130)
Total CHOL/HDL Ratio: 5.2 Ratio — ABNORMAL HIGH (ref <=5.0)
Triglycerides: 371 mg/dL — ABNORMAL HIGH (ref <150)
VLDL: 74 mg/dL — ABNORMAL HIGH (ref <30)

## 2016-01-15 NOTE — Addendum Note (Signed)
Addended by: Eulis Foster on: 01/15/2016 08:32 AM   Modules accepted: Orders

## 2016-01-21 ENCOUNTER — Encounter: Payer: Self-pay | Admitting: Adult Health

## 2016-01-21 ENCOUNTER — Ambulatory Visit (INDEPENDENT_AMBULATORY_CARE_PROVIDER_SITE_OTHER): Payer: PRIVATE HEALTH INSURANCE | Admitting: Adult Health

## 2016-01-21 VITALS — BP 132/76 | HR 88 | Ht 60.0 in | Wt 117.0 lb

## 2016-01-21 DIAGNOSIS — E785 Hyperlipidemia, unspecified: Secondary | ICD-10-CM | POA: Diagnosis not present

## 2016-01-21 DIAGNOSIS — I10 Essential (primary) hypertension: Secondary | ICD-10-CM | POA: Diagnosis not present

## 2016-01-21 MED ORDER — LOVASTATIN 20 MG PO TABS: 20.0000 mg | ORAL_TABLET | Freq: Every day | ORAL | 6 refills | Status: DC

## 2016-01-21 NOTE — Progress Notes (Signed)
Name: Erin [name]    DOB: 1961/04/15  Age: 55 y.o.  MR#: [id number]       PCP:  Jonathon Bellows, MD      Insurance: Payor: MEDCOST / Plan: MEDCOST / Product Type: *No Product type* /   CC:   No chief complaint on file.   VS Vitals:   01/21/16 1605  Weight: 117 lb (53.1 kg)  Height: 5' (1.524 m)    Weights Current Weight  01/21/16 117 lb (53.1 kg)  12/29/15 118 lb (53.5 kg)  12/22/15 119 lb (54 kg)    Blood Pressure  BP Readings from Last 3 Encounters:  12/29/15 138/80  12/22/15 (!) 166/100  12/04/15 (!) 178/120     Admit date:  (Not on file) Last encounter with RMR:  12/22/2015   Allergy Review of patient's allergies indicates no known allergies.  Current Outpatient Prescriptions  Medication Sig Dispense Refill  . acetaminophen (TYLENOL) 500 MG tablet Take 1,000 mg by mouth every 8 (eight) hours as needed for mild pain or moderate pain.    [name] Kitchen aspirin 81 MG tablet Take 81 mg by mouth daily.    . calcium carbonate (TUMS EX) 750 MG chewable tablet Chew 1 tablet by mouth as needed for heartburn.    [name] Kitchen lisinopril-hydrochlorothiazide (ZESTORETIC) 10-12.5 MG tablet Take 1 tablet by mouth daily. 30 tablet 6  . metoprolol tartrate (LOPRESSOR) 25 MG tablet Take 1 tablet (25 mg total) by mouth 2 (two) times daily. 60 tablet 11  . nitroGLYCERIN (NITROSTAT) 0.4 MG SL tablet Place 1 tablet (0.4 mg total) under the tongue every 5 (five) minutes x 3 doses as needed for chest pain. 25 tablet 3   No current facility-administered medications for this visit.     Discontinued Meds:    Medications Discontinued During This Encounter  Medication Reason  . isosorbide mononitrate (IMDUR) 30 MG 24 hr tablet Error  . lovastatin (MEVACOR) 20 MG tablet Error    Patient Active Problem List   Diagnosis Date Noted  . Pain in the chest   . Benign essential HTN 09/06/2015  . Hyperlipidemia 09/06/2015  . GERD (gastroesophageal reflux disease) 09/06/2015  . Chest pain 09/06/2015    LABS     Component Value Date/Time   NA 139 09/06/2015 0025   NA 140 05/10/2013 1600   NA 136 01/01/2012 0840   K 3.7 09/06/2015 0025   K 4.2 05/10/2013 1600   K 3.4 (L) 01/01/2012 0840   CL 109 09/06/2015 0025   CL 104 05/10/2013 1600   CL 100 01/01/2012 0840   CO2 21 (L) 09/06/2015 0025   CO2 24 05/10/2013 1600   CO2 25 01/01/2012 0840   GLUCOSE 96 09/06/2015 0025   GLUCOSE 89 05/10/2013 1600   GLUCOSE 89 01/01/2012 0840   BUN 8 09/06/2015 0025   BUN 10 05/10/2013 1600   BUN 10 01/01/2012 0840   CREATININE 0.70 09/06/2015 0025   CREATININE 0.68 05/10/2013 1600   CREATININE 0.77 01/01/2012 0840   CALCIUM 8.6 (L) 09/06/2015 0025   CALCIUM 9.2 05/10/2013 1600   CALCIUM 9.9 01/01/2012 0840   GFRNONAA >60 09/06/2015 0025   GFRNONAA >90 05/10/2013 1600   GFRNONAA >90 01/01/2012 0840   GFRAA >60 09/06/2015 0025   GFRAA >90 05/10/2013 1600   GFRAA >90 01/01/2012 0840   CMP     Component Value Date/Time   NA 139 09/06/2015 0025   K 3.7 09/06/2015 0025   CL 109 09/06/2015 0025   CO2  21 (L) 09/06/2015 0025   GLUCOSE 96 09/06/2015 0025   BUN 8 09/06/2015 0025   CREATININE 0.70 09/06/2015 0025   CALCIUM 8.6 (L) 09/06/2015 0025   PROT 6.8 01/15/2016 0832   ALBUMIN 4.1 01/15/2016 0832   AST 13 01/15/2016 0832   ALT 10 01/15/2016 0832   ALKPHOS 72 01/15/2016 0832   BILITOT 0.5 01/15/2016 0832   GFRNONAA >60 09/06/2015 0025   GFRAA >60 09/06/2015 0025       Component Value Date/Time   WBC 11.3 (H) 09/06/2015 0025   WBC 10.1 01/01/2012 0840   WBC 12.0 (H) 08/10/2007 0525   HGB 12.2 09/06/2015 0025   HGB 14.7 05/14/2013 0925   HGB 14.1 01/01/2012 0840   HCT 37.1 09/06/2015 0025   HCT 41.0 01/01/2012 0840   HCT 32.2 (L) 08/10/2007 0525   MCV 85.1 09/06/2015 0025   MCV 82.7 01/01/2012 0840   MCV 86.9 08/10/2007 0525    Lipid Panel     Component Value Date/Time   CHOL 273 (H) 01/15/2016 0832   TRIG 371 (H) 01/15/2016 0832   HDL 53 01/15/2016 0832   CHOLHDL 5.2 (H)  01/15/2016 0832   VLDL 74 (H) 01/15/2016 0832   LDLCALC 146 (H) 01/15/2016 0832    ABG No results found for: PHART, PCO2ART, PO2ART, HCO3, TCO2, ACIDBASEDEF, O2SAT   No results found for: TSH BNP (last 3 results) No results for input(s): BNP in the last 8760 hours.  ProBNP (last 3 results) No results for input(s): PROBNP in the last 8760 hours.  Cardiac Panel (last 3 results) No results for input(s): CKTOTAL, CKMB, TROPONINI, RELINDX in the last 72 hours.  Iron/TIBC/Ferritin/ %Sat No results found for: IRON, TIBC, FERRITIN, IRONPCTSAT   EKG Orders placed or performed during the hospital encounter of 09/05/15  . EKG 12-Lead  . EKG 12-Lead     Prior Assessment and Plan Problem List as of 01/21/2016 Reviewed: 12/22/2015  5:45 PM by Jory Sims, NP     Cardiovascular and Mediastinum   Benign essential HTN     Digestive   GERD (gastroesophageal reflux disease)     Other   Hyperlipidemia   Chest pain   Pain in the chest       Imaging: No results found.

## 2016-01-21 NOTE — Patient Instructions (Addendum)
Your physician recommends that you schedule a follow-up appointment in: 3 Months   Your physician has recommended you make the following change in your medication:   Start Lovastatin 20 mg Daily    Your physician recommends that you return for lab work in 3 Months just before next visit.   If you need a refill on your cardiac medications before your next appointment, please call your pharmacy.  Thank you for choosing [facility]!

## 2016-01-21 NOTE — Progress Notes (Signed)
Cardiology Office Note   Date:  01/21/2016   ID:  Alphonsus Sias, DOB [birth year], MRN [id number]  PCP:  Jonathon Bellows, MD  Cardiologist: Cleotilde Neer, NP   No chief complaint on file.     History of Present Illness: [name] is a 55 y.o. female who presents for ongoing assessment and management of hypertension. She had been seen in the hospital on numerous occasions for hypertension urgency. She was last in the office on 12/22/2015 with elevated blood pressure 166/100.   She has not taken isosorbide as she was unable to afford them. She was complaining of chest pressure and headache at that time. I started her on lisinopril 10/12.5 mg daily. Echocardiogram was ordered for evaluation of LV function and structural heart disease along with diastolic dysfunction. She was to come back for a blood pressure check after starting this new medication. Consideration for starting statin therapy on next visit.  Follow-up blood pressure check in the office one week later demonstrated blood pressure 130/80 with a heart rate of 89. She was continued on her current medication regimen.  Follow-up labs were completed concerning cholesterol status. She was found to be elevated. Cholesterol 273 triglycerides 371 HDL 53 LDL 146.  She states she is feeling better. She is done with a GI bug right now but otherwise is doing well.  Past Medical History:  Diagnosis Date  . Benign essential HTN 09/06/2015  . GERD (gastroesophageal reflux disease)   . GERD (gastroesophageal reflux disease) 09/06/2015  . Hyperlipidemia 09/06/2015  . Hypertension   . Wears dentures    top  . Wears glasses     Past Surgical History:  Procedure Laterality Date  . APPENDECTOMY    . CHOLECYSTECTOMY  1984  . FINGER ARTHROPLASTY Left 05/14/2013   Procedure: LEFT THUMB CARPAL METACARPAL ARTHROPLASTY;  Surgeon: Tennis Must, MD;  Location: [place];  Service: Orthopedics;  Laterality: Left;  . TUBAL  LIGATION  1987  . VAGINAL HYSTERECTOMY  2009     Current Outpatient Prescriptions  Medication Sig Dispense Refill  . acetaminophen (TYLENOL) 500 MG tablet Take 1,000 mg by mouth every 8 (eight) hours as needed for mild pain or moderate pain.    [name] Kitchen aspirin 81 MG tablet Take 81 mg by mouth daily.    . calcium carbonate (TUMS EX) 750 MG chewable tablet Chew 1 tablet by mouth as needed for heartburn.    [name] Kitchen lisinopril-hydrochlorothiazide (ZESTORETIC) 10-12.5 MG tablet Take 1 tablet by mouth daily. 30 tablet 6  . metoprolol tartrate (LOPRESSOR) 25 MG tablet Take 1 tablet (25 mg total) by mouth 2 (two) times daily. 60 tablet 11  . nitroGLYCERIN (NITROSTAT) 0.4 MG SL tablet Place 1 tablet (0.4 mg total) under the tongue every 5 (five) minutes x 3 doses as needed for chest pain. 25 tablet 3  . lovastatin (MEVACOR) 20 MG tablet Take 1 tablet (20 mg total) by mouth at bedtime. 30 tablet 6   No current facility-administered medications for this visit.     Allergies:   Review of patient's allergies indicates no known allergies.    Social History:  The patient  reports that she has been smoking Cigarettes.  She has a 7.50 pack-year smoking history. She has never used smokeless tobacco. She reports that she drinks alcohol. She reports that she does not use drugs.   Family History:  The patient's family history includes Diabetes in her mother; Heart failure in her other; Hypertension in her  mother; Stroke in her mother.    ROS: All other systems are reviewed and negative. Unless otherwise mentioned in H&P    PHYSICAL EXAM: VS:  BP 132/76   Pulse 88   Ht 5' (1.524 m)   Wt 117 lb (53.1 kg)   SpO2 95%   BMI 22.85 kg/m  , BMI Body mass index is 22.85 kg/m. GEN: Well nourished, well developed, in no acute distress  HEENT: normal  Neck: no JVD, carotid bruits, or masses Cardiac:RRR; no murmurs, rubs, or gallops,no edema  Respiratory:  clear to auscultation bilaterally, normal work of breathing GI:  soft, nontender, nondistended, + BS MS: no deformity or atrophy  Skin: warm and dry, no rash Neuro:  Strength and sensation are intact Psych: euthymic mood, full affect  Recent Labs: 09/06/2015: BUN 8; Creatinine, Ser 0.70; Hemoglobin 12.2; Magnesium 2.1; Platelets 348; Potassium 3.7; Sodium 139 01/15/2016: ALT 10    Lipid Panel    Component Value Date/Time   CHOL 273 (H) 01/15/2016 0832   TRIG 371 (H) 01/15/2016 0832   HDL 53 01/15/2016 0832   CHOLHDL 5.2 (H) 01/15/2016 0832   VLDL 74 (H) 01/15/2016 0832   LDLCALC 146 (H) 01/15/2016 0832      Wt Readings from Last 3 Encounters:  01/21/16 117 lb (53.1 kg)  12/29/15 118 lb (53.5 kg)  12/22/15 119 lb (54 kg)     ASSESSMENT AND PLAN:  1.  Hypertension: Very well controlled now on current medication regimen. Will not make any changes.  2. Hypercholesterolemia: She has insurance now and is willing to accept statin therapy. I will start her on lovastatin 20 mg daily as it is on the [employer] plan to help her with cost. If this is still too expensive for her we can change her to atorvastatin may be covered by her insurance a little better.May need to follow-up with hemoglobin A1c with elevated triglycerides. However I will await follow-up labs to make that decision.     Current medicines are reviewed at length with the patient today.    Labs/ tests ordered today include: Follow-up lipids and LFTs in 3 months   Orders Placed This Encounter  Procedures  . Hepatic function panel  . Basic Metabolic Panel (BMET)     Disposition:   FU with 3 months  Signed, Jory Sims, NP  01/21/2016 4:42 PM    [facility] [address] [place], [place] [address] Phone: [phone]; Fax: [phone]

## 2016-02-02 ENCOUNTER — Telehealth: Payer: Self-pay | Admitting: Cardiology

## 2016-02-02 DIAGNOSIS — E785 Hyperlipidemia, unspecified: Secondary | ICD-10-CM

## 2016-02-02 NOTE — Telephone Encounter (Signed)
New message    Pt returning nurse call to get test results.

## 2016-02-02 NOTE — Telephone Encounter (Signed)
Called pt back to make her aware of her lab results. Sent a message to Almyra Deforest, PA-C to see if we needed to repeat Lipid since pt just started cholesterol med last week.  Order in just in case.  Pt agreeable with this plan.

## 2016-02-03 ENCOUNTER — Telehealth: Payer: Self-pay | Admitting: *Deleted

## 2016-02-03 DIAGNOSIS — E785 Hyperlipidemia, unspecified: Secondary | ICD-10-CM

## 2016-02-03 NOTE — Telephone Encounter (Signed)
-----   Message from [place], [name] sent at 02/02/2016  9:08 PM EDT ----- Advise recheck both fasting lipid panel and liver function test in 6 weeks, during mean time it is very important she stay on Lovastatin. Thank you.

## 2016-02-03 NOTE — Telephone Encounter (Signed)
Informed pt of recommendations per Almyra Deforest, PA-C to have lipid and liver drawn in 6 weeks.  Scheduled pt for labs on 11/9.  Pt verbalized understanding.

## 2016-03-09 DIAGNOSIS — J189 Pneumonia, unspecified organism: Secondary | ICD-10-CM

## 2016-03-09 HISTORY — DX: Pneumonia, unspecified organism: J18.9

## 2016-03-10 DIAGNOSIS — I214 Non-ST elevation (NSTEMI) myocardial infarction: Secondary | ICD-10-CM

## 2016-03-10 HISTORY — DX: Non-ST elevation (NSTEMI) myocardial infarction: I21.4

## 2016-03-11 ENCOUNTER — Encounter (HOSPITAL_COMMUNITY)
Admission: EM | Disposition: A | Discharge status: Home / Self Care | Payer: Self-pay | Source: Other Acute Inpatient Hospital | Attending: Cardiology

## 2016-03-11 ENCOUNTER — Inpatient Hospital Stay (HOSPITAL_COMMUNITY)
Admission: EM | Admit: 2016-03-11 | Discharge: 2016-03-15 | DRG: 246 | Disposition: A | Discharge status: Home / Self Care | Payer: PRIVATE HEALTH INSURANCE | Source: Other Acute Inpatient Hospital | Attending: Cardiology | Admitting: Cardiology

## 2016-03-11 ENCOUNTER — Encounter (HOSPITAL_COMMUNITY): Payer: Self-pay | Admitting: Cardiology

## 2016-03-11 DIAGNOSIS — J189 Pneumonia, unspecified organism: Secondary | ICD-10-CM | POA: Diagnosis present

## 2016-03-11 DIAGNOSIS — Z8249 Family history of ischemic heart disease and other diseases of the circulatory system: Secondary | ICD-10-CM | POA: Diagnosis not present

## 2016-03-11 DIAGNOSIS — E785 Hyperlipidemia, unspecified: Secondary | ICD-10-CM | POA: Diagnosis present

## 2016-03-11 DIAGNOSIS — I2510 Atherosclerotic heart disease of native coronary artery without angina pectoris: Secondary | ICD-10-CM

## 2016-03-11 DIAGNOSIS — E7800 Pure hypercholesterolemia, unspecified: Secondary | ICD-10-CM | POA: Diagnosis not present

## 2016-03-11 DIAGNOSIS — Z720 Tobacco use: Secondary | ICD-10-CM

## 2016-03-11 DIAGNOSIS — Z90710 Acquired absence of both cervix and uterus: Secondary | ICD-10-CM

## 2016-03-11 DIAGNOSIS — I10 Essential (primary) hypertension: Secondary | ICD-10-CM | POA: Diagnosis present

## 2016-03-11 DIAGNOSIS — I214 Non-ST elevation (NSTEMI) myocardial infarction: Principal | ICD-10-CM

## 2016-03-11 DIAGNOSIS — K219 Gastro-esophageal reflux disease without esophagitis: Secondary | ICD-10-CM | POA: Diagnosis present

## 2016-03-11 DIAGNOSIS — R0600 Dyspnea, unspecified: Secondary | ICD-10-CM

## 2016-03-11 DIAGNOSIS — F17210 Nicotine dependence, cigarettes, uncomplicated: Secondary | ICD-10-CM | POA: Diagnosis present

## 2016-03-11 DIAGNOSIS — I340 Nonrheumatic mitral (valve) insufficiency: Secondary | ICD-10-CM | POA: Diagnosis present

## 2016-03-11 DIAGNOSIS — E782 Mixed hyperlipidemia: Secondary | ICD-10-CM | POA: Diagnosis present

## 2016-03-11 DIAGNOSIS — Z823 Family history of stroke: Secondary | ICD-10-CM

## 2016-03-11 DIAGNOSIS — Z955 Presence of coronary angioplasty implant and graft: Secondary | ICD-10-CM

## 2016-03-11 DIAGNOSIS — E876 Hypokalemia: Secondary | ICD-10-CM | POA: Diagnosis present

## 2016-03-11 DIAGNOSIS — I2542 Coronary artery dissection: Secondary | ICD-10-CM | POA: Diagnosis present

## 2016-03-11 DIAGNOSIS — I255 Ischemic cardiomyopathy: Secondary | ICD-10-CM | POA: Diagnosis not present

## 2016-03-11 DIAGNOSIS — J181 Lobar pneumonia, unspecified organism: Secondary | ICD-10-CM | POA: Diagnosis not present

## 2016-03-11 HISTORY — DX: Squamous cell carcinoma of skin of nose: C44.321

## 2016-03-11 HISTORY — DX: Basal cell carcinoma of skin of other parts of face: C44.319

## 2016-03-11 HISTORY — PX: CARDIAC CATHETERIZATION: SHX172

## 2016-03-11 HISTORY — DX: Atherosclerotic heart disease of native coronary artery without angina pectoris: I25.10

## 2016-03-11 LAB — TROPONIN I: Troponin I: 40.82 ng/mL (ref <0.03)

## 2016-03-11 LAB — POCT I-STAT, CHEM 8
BUN: 12 mg/dL (ref 6–20)
Calcium, Ion: 1.25 mmol/L (ref 1.15–1.40)
Chloride: 105 mmol/L (ref 101–111)
Creatinine, Ser: 0.6 mg/dL (ref 0.44–1.00)
Glucose, Bld: 123 mg/dL — ABNORMAL HIGH (ref 65–99)
HCT: 36 % (ref 36.0–46.0)
Hemoglobin: 12.2 g/dL (ref 12.0–15.0)
Potassium: 3.9 mmol/L (ref 3.5–5.1)
Sodium: 140 mmol/L (ref 135–145)
TCO2: 22 mmol/L (ref 0–100)

## 2016-03-11 LAB — CBC
HCT: 42.6 % (ref 36.0–46.0)
Hemoglobin: 14.5 g/dL (ref 12.0–15.0)
MCH: 28.5 pg (ref 26.0–34.0)
MCHC: 34 g/dL (ref 30.0–36.0)
MCV: 83.7 fL (ref 78.0–100.0)
Platelets: 232 10*3/uL (ref 150–400)
RBC: 5.09 MIL/uL (ref 3.87–5.11)
RDW: 14.8 % (ref 11.5–15.5)
WBC: 9.5 10*3/uL (ref 4.0–10.5)

## 2016-03-11 SURGERY — LEFT HEART CATH AND CORONARY ANGIOGRAPHY
Anesthesia: LOCAL

## 2016-03-11 MED ORDER — HEPARIN SODIUM (PORCINE) 1000 UNIT/ML IJ SOLN
INTRAMUSCULAR | Status: DC | PRN
  Administered 2016-03-11: 3000 [IU] via INTRAVENOUS

## 2016-03-11 MED ORDER — HEPARIN (PORCINE) IN NACL 2-0.9 UNIT/ML-% IJ SOLN
INTRAMUSCULAR | Status: DC | PRN
  Administered 2016-03-11: 1000 mL

## 2016-03-11 MED ORDER — SODIUM CHLORIDE 0.9 % IV SOLN: 250.0000 mL | INTRAVENOUS | Status: DC | PRN

## 2016-03-11 MED ORDER — FENTANYL CITRATE (PF) 100 MCG/2ML IJ SOLN
INTRAMUSCULAR | Status: AC
  Filled 2016-03-11: qty 2

## 2016-03-11 MED ORDER — IOPAMIDOL (ISOVUE-370) INJECTION 76%
INTRAVENOUS | Status: AC
  Filled 2016-03-11: qty 100

## 2016-03-11 MED ORDER — NITROGLYCERIN 1 MG/10 ML FOR IR/CATH LAB
INTRA_ARTERIAL | Status: DC | PRN
  Administered 2016-03-11 (×2): 200 ug via INTRACORONARY

## 2016-03-11 MED ORDER — BIVALIRUDIN 250 MG IV SOLR
INTRAVENOUS | Status: AC
  Filled 2016-03-11: qty 250

## 2016-03-11 MED ORDER — TIROFIBAN HCL IN NACL 5-0.9 MG/100ML-% IV SOLN: 0.1500 ug/kg/min | INTRAVENOUS | Status: DC

## 2016-03-11 MED ORDER — FUROSEMIDE 10 MG/ML IJ SOLN
40.0000 mg | Freq: Once | INTRAMUSCULAR | Status: AC
  Administered 2016-03-11: 22:00:00 40 mg via INTRAVENOUS
  Filled 2016-03-11: qty 4

## 2016-03-11 MED ORDER — NITROGLYCERIN 0.4 MG SL SUBL: 0.4000 mg | SUBLINGUAL_TABLET | SUBLINGUAL | Status: DC | PRN

## 2016-03-11 MED ORDER — SODIUM CHLORIDE 0.9 % IV SOLN
INTRAVENOUS | Status: DC | PRN
  Administered 2016-03-11: 1.75 mg/kg/h via INTRAVENOUS

## 2016-03-11 MED ORDER — NITROGLYCERIN IN D5W 200-5 MCG/ML-% IV SOLN: INTRAVENOUS | Status: DC | PRN

## 2016-03-11 MED ORDER — IOPAMIDOL (ISOVUE-370) INJECTION 76%
INTRAVENOUS | Status: DC | PRN
  Administered 2016-03-11: 180 mL via INTRAVENOUS

## 2016-03-11 MED ORDER — VERAPAMIL HCL 2.5 MG/ML IV SOLN
INTRAVENOUS | Status: DC | PRN
  Administered 2016-03-11: 10 mL via INTRA_ARTERIAL

## 2016-03-11 MED ORDER — LORAZEPAM 0.5 MG PO TABS
1.0000 mg | ORAL_TABLET | Freq: Four times a day (QID) | ORAL | Status: DC | PRN
  Administered 2016-03-11 – 2016-03-15 (×4): 1 mg via ORAL
  Filled 2016-03-11: qty 2
  Filled 2016-03-11 (×2): qty 1
  Filled 2016-03-11: qty 2

## 2016-03-11 MED ORDER — SODIUM CHLORIDE 0.9 % IV SOLN
INTRAVENOUS | Status: DC | PRN
  Administered 2016-03-11: 50 mL/h via INTRAVENOUS

## 2016-03-11 MED ORDER — CALCIUM CARBONATE ANTACID 500 MG PO CHEW
1.0000 | CHEWABLE_TABLET | ORAL | Status: DC | PRN
  Administered 2016-03-13: 200 mg via ORAL
  Filled 2016-03-11 (×2): qty 1

## 2016-03-11 MED ORDER — ASPIRIN 81 MG PO CHEW
81.0000 mg | CHEWABLE_TABLET | Freq: Every day | ORAL | Status: DC
  Administered 2016-03-12 – 2016-03-15 (×4): 81 mg via ORAL
  Filled 2016-03-11 (×4): qty 1

## 2016-03-11 MED ORDER — FENTANYL CITRATE (PF) 100 MCG/2ML IJ SOLN
INTRAMUSCULAR | Status: DC | PRN
  Administered 2016-03-11: 25 ug via INTRAVENOUS

## 2016-03-11 MED ORDER — ACETAMINOPHEN 325 MG PO TABS
650.0000 mg | ORAL_TABLET | ORAL | Status: DC | PRN
  Administered 2016-03-11 – 2016-03-13 (×4): 650 mg via ORAL
  Filled 2016-03-11 (×4): qty 2

## 2016-03-11 MED ORDER — SODIUM CHLORIDE 0.9% FLUSH: 3.0000 mL | INTRAVENOUS | Status: DC | PRN

## 2016-03-11 MED ORDER — MORPHINE SULFATE (PF) 2 MG/ML IV SOLN: 1.0000 mg | INTRAVENOUS | Status: DC | PRN

## 2016-03-11 MED ORDER — ONDANSETRON HCL 4 MG/2ML IJ SOLN: 4.0000 mg | Freq: Four times a day (QID) | INTRAMUSCULAR | Status: DC | PRN

## 2016-03-11 MED ORDER — TICAGRELOR 90 MG PO TABS
ORAL_TABLET | ORAL | Status: DC | PRN
  Administered 2016-03-11: 180 mg via ORAL

## 2016-03-11 MED ORDER — HEPARIN SODIUM (PORCINE) 5000 UNIT/ML IJ SOLN
5000.0000 [IU] | Freq: Three times a day (TID) | INTRAMUSCULAR | Status: DC
  Administered 2016-03-11 – 2016-03-12 (×2): 5000 [IU] via SUBCUTANEOUS
  Filled 2016-03-11 (×2): qty 1

## 2016-03-11 MED ORDER — NITROGLYCERIN IN D5W 200-5 MCG/ML-% IV SOLN: 15.0000 ug/min | INTRAVENOUS | Status: DC

## 2016-03-11 MED ORDER — MIDAZOLAM HCL 2 MG/2ML IJ SOLN
INTRAMUSCULAR | Status: AC
  Filled 2016-03-11: qty 2

## 2016-03-11 MED ORDER — METOPROLOL TARTRATE 25 MG PO TABS
25.0000 mg | ORAL_TABLET | Freq: Two times a day (BID) | ORAL | Status: DC
  Administered 2016-03-11 – 2016-03-15 (×7): 25 mg via ORAL
  Filled 2016-03-11 (×8): qty 1

## 2016-03-11 MED ORDER — TICAGRELOR 90 MG PO TABS
90.0000 mg | ORAL_TABLET | Freq: Two times a day (BID) | ORAL | Status: DC
  Administered 2016-03-12 – 2016-03-15 (×7): 90 mg via ORAL
  Filled 2016-03-11 (×7): qty 1

## 2016-03-11 MED ORDER — ATORVASTATIN CALCIUM 80 MG PO TABS
80.0000 mg | ORAL_TABLET | Freq: Every day | ORAL | Status: DC
  Administered 2016-03-12 – 2016-03-13 (×2): 80 mg via ORAL
  Filled 2016-03-11 (×3): qty 1

## 2016-03-11 MED ORDER — LIDOCAINE HCL (PF) 1 % IJ SOLN
INTRAMUSCULAR | Status: DC | PRN
  Administered 2016-03-11: 2 mL via SUBCUTANEOUS

## 2016-03-11 MED ORDER — NITROGLYCERIN 1 MG/10 ML FOR IR/CATH LAB
INTRA_ARTERIAL | Status: AC
  Filled 2016-03-11: qty 10

## 2016-03-11 MED ORDER — VERAPAMIL HCL 2.5 MG/ML IV SOLN
INTRAVENOUS | Status: AC
  Filled 2016-03-11: qty 2

## 2016-03-11 MED ORDER — SODIUM CHLORIDE 0.9% FLUSH
3.0000 mL | Freq: Two times a day (BID) | INTRAVENOUS | Status: DC
  Administered 2016-03-11 – 2016-03-15 (×6): 3 mL via INTRAVENOUS

## 2016-03-11 MED ORDER — HEPARIN (PORCINE) IN NACL 2-0.9 UNIT/ML-% IJ SOLN
INTRAMUSCULAR | Status: AC
  Filled 2016-03-11: qty 1000

## 2016-03-11 MED ORDER — HEPARIN SODIUM (PORCINE) 1000 UNIT/ML IJ SOLN
INTRAMUSCULAR | Status: AC
Start: 2016-03-11 — End: 2016-03-11
  Filled 2016-03-11: qty 1

## 2016-03-11 MED ORDER — BIVALIRUDIN BOLUS VIA INFUSION - CUPID
INTRAVENOUS | Status: DC | PRN
  Administered 2016-03-11: 39.825 mg via INTRAVENOUS

## 2016-03-11 MED ORDER — MIDAZOLAM HCL 2 MG/2ML IJ SOLN
INTRAMUSCULAR | Status: DC | PRN
  Administered 2016-03-11: 1 mg via INTRAVENOUS

## 2016-03-11 MED ORDER — SODIUM CHLORIDE 0.9 % WEIGHT BASED INFUSION: 1.0000 mL/kg/h | INTRAVENOUS | Status: DC

## 2016-03-11 MED ORDER — LIDOCAINE HCL (PF) 1 % IJ SOLN
INTRAMUSCULAR | Status: AC
  Filled 2016-03-11: qty 30

## 2016-03-11 MED ORDER — LISINOPRIL-HYDROCHLOROTHIAZIDE 10-12.5 MG PO TABS: 1.0000 | ORAL_TABLET | Freq: Every day | ORAL | Status: DC

## 2016-03-11 SURGICAL SUPPLY — 19 items
BALLN MOZEC 2.50X14 (BALLOONS) ×2
BALLN ~~LOC~~ MOZEC 3.5X8 (BALLOONS) ×2
BALLOON MOZEC 2.50X14 (BALLOONS) ×1 IMPLANT
BALLOON ~~LOC~~ MOZEC 3.5X8 (BALLOONS) ×1 IMPLANT
CATH INFINITI 5FR ANG PIGTAIL (CATHETERS) ×2 IMPLANT
CATH OPTITORQUE TIG 4.0 5F (CATHETERS) ×2 IMPLANT
CATH OPTITORQUE TIG 4.5 5F (CATHETERS) IMPLANT
CATH VISTA GUIDE 6FR XBLAD3.5 (CATHETERS) ×2 IMPLANT
DEVICE RAD COMP TR BAND LRG (VASCULAR PRODUCTS) ×2 IMPLANT
GLIDESHEATH SLEND A-KIT 6F 22G (SHEATH) ×2 IMPLANT
KIT ENCORE 26 ADVANTAGE (KITS) ×2 IMPLANT
KIT HEART LEFT (KITS) ×2 IMPLANT
PACK CARDIAC CATHETERIZATION (CUSTOM PROCEDURE TRAY) ×2 IMPLANT
STENT SYNERGY DES 3X12 (Permanent Stent) ×2 IMPLANT
SYR MEDRAD MARK V 150ML (SYRINGE) ×2 IMPLANT
TRANSDUCER W/STOPCOCK (MISCELLANEOUS) ×2 IMPLANT
TUBING CIL FLEX 10 FLL-RA (TUBING) ×2 IMPLANT
WIRE HI TORQ BMW 190CM (WIRE) ×2 IMPLANT
WIRE SAFE-T 1.5MM-J .035X260CM (WIRE) ×2 IMPLANT

## 2016-03-11 NOTE — Progress Notes (Signed)
Arrived to Holding Area from [facility] pre cardiac cath. NTG drip at 35mcg; Heparin drip at 880U/Hr

## 2016-03-11 NOTE — Progress Notes (Signed)
TR BAND REMOVAL  LOCATION:    right radial  DEFLATED PER PROTOCOL:    Yes.    TIME BAND OFF / DRESSING APPLIED:    2245   SITE UPON ARRIVAL:    Level 0  SITE AFTER BAND REMOVAL:    Level 0  CIRCULATION SENSATION AND MOVEMENT:    Within Normal Limits   Yes.    COMMENTS:

## 2016-03-11 NOTE — H&P (Signed)
Cardiology H&P    Patient ID: Erin [name] MRN: [id number], DOB/AGE: [birth year]  Admit date: 03/11/2016 Date of Consult: 03/11/2016  Primary Physician: Jonathon Bellows, MD  Patient Profile    Erin [name] is a 55 year old female with a past medical history of HTN and HLD. No prior history of CAD. She presented to [facility] with chest pain and troponin of 0.30. History of Present Illness  Erin [name] is a 55 year old female with a past medical history of HTN and tobacco abuse. She was asleep in her bed last night and developed acute onset chest pain that she describes as substernal heaviness. Pain was associated with nausea and diaphoresis. She took an ASA and one SL Nitro which helped her pain and she went back to sleep. Around 2am, she was awakened again with chest pain this time it was more severe, but still had the same characteristics of chest heaviness with associated nausea and diaphoresis. She took another [employer] and called EMS.  She was taken to [facility] and her EKG was reportedly normal. First troponin was negative. Subsequent troponin was 0.30. She was transferred to [facility] for heart catheterization. She continues to have chest pain and feels nauseous at the time of my encounter.  Erin [name] is a current every day smoker, she occasionally drinks beer. She was seen at [facility] in April of this year after reporting chest pain and neck pain. She underwent a nuclear stress test which showed a small reversible defect in the anterior wall, the images were reviewed by a Cardiologist and no ischemia was noted on the images. She was discharged to home, follow up Echo showed normal EF with no wall motion abnormalities.    Past Medical History   Past Medical History:  Diagnosis Date  . Benign essential HTN 09/06/2015  . GERD (gastroesophageal reflux disease)   . GERD (gastroesophageal reflux disease) 09/06/2015  . Hyperlipidemia 09/06/2015  . Hypertension   . Wears dentures    top  . Wears glasses     Past Surgical History:  Procedure Laterality Date  . APPENDECTOMY    . CHOLECYSTECTOMY  1984  . FINGER ARTHROPLASTY Left 05/14/2013   Procedure: LEFT THUMB CARPAL METACARPAL ARTHROPLASTY;  Surgeon: Tennis Must, MD;  Location: [place];  Service: Orthopedics;  Laterality: Left;  . TUBAL LIGATION  1987  . VAGINAL HYSTERECTOMY  2009     Allergies  No Known Allergies  Inpatient Medications      Family History    Family History  Problem Relation Age of Onset  . Stroke Mother   . Hypertension Mother   . Diabetes Mother   . Heart failure Other     Social History    Social History   Social History  . Marital status: Widowed    Spouse name: N/A  . Number of children: N/A  . Years of education: N/A   Occupational History  . Not on file.   Social History Main Topics  . Smoking status: Current Every Day Smoker    Packs/day: 0.50    Years: 15.00    Types: Cigarettes  . Smokeless tobacco: Never Used  . Alcohol use Yes     Comment: OCCASSIONAL BEER  . Drug use: No  . Sexual activity: Yes    Birth control/ protection: Surgical   Other Topics Concern  . Not on file   Social History Narrative  . No narrative on file  Review of Systems    General:  No chills, fever, night sweats or weight changes.  Cardiovascular:  + chest pain, dyspnea on exertion, edema, orthopnea, palpitations, paroxysmal nocturnal dyspnea. Dermatological: No rash, lesions/masses Respiratory: No cough, dyspnea Urologic: No hematuria, dysuria Abdominal:   + nausea, vomiting, diarrhea, bright red blood per rectum, melena, or hematemesis Neurologic:  No visual changes, wkns, changes in mental status. All other systems reviewed and are otherwise negative except as noted above.  Physical Exam    Blood pressure (!) 133/98, pulse 67, resp. rate (!) 21, SpO2 94 %.  General: Pleasant, NAD, still having chest pain  Psych: Normal affect. Neuro: Alert and  oriented X 3. Moves all extremities spontaneously. HEENT: Normal  Neck: Supple without bruits or JVD. Lungs:  Resp regular and unlabored, CTA. Heart: RRR no s3, s4, or murmurs. Abdomen: Soft, non-tender, non-distended, BS + x 4.  Extremities: No clubbing, cyanosis or edema. DP/PT/Radials 2+ and equal bilaterally.  Labs      Lab Results  Component Value Date   CHOL 273 (H) 01/15/2016   HDL 53 01/15/2016   LDLCALC 146 (H) 01/15/2016   TRIG 371 (H) 01/15/2016   No results found for: [facility]   Radiology Studies    No results found.  EKG & Cardiac Imaging    EKG: NSR  Assessment & Plan    1. NSTEMI: Presents with chest pain and associated nausea. Troponin elevated at 0.3. She has been started on heparin and Nitro gtts, continues to have chest pain. We will proceed with cardiac cath to rule out an ischemic cause of her pain.  2. HLD: LDL is 132. Will switch to atorvastatin 80mg .  3. HTN: Well controlled on current regimen.  4. Tobacco abuse: Encouraged cessation  Signed, Arbutus Leas, NP 03/11/2016, 3:56 PM Pager: [phone]

## 2016-03-11 NOTE — Interval H&P Note (Signed)
History and Physical Interval Note:  03/11/2016 4:21 PM  [name]  has presented today for surgery, with the diagnosis of NSTEMI.  The various methods of treatment have been discussed with the patient and family. After consideration of risks, benefits and other options for treatment, the patient has consented to  Procedure(s): Left Heart Cath and Coronary Angiography (N/A) as a surgical intervention .  The patient's history has been reviewed, patient examined, no change in status, stable for surgery.  I have reviewed the patient's chart and labs.  Questions were answered to the patient's satisfaction.    Cath Lab Visit (complete for each Cath Lab visit)  Clinical Evaluation Leading to the Procedure:   ACS: Yes.    Non-ACS:    Anginal Classification: CCS IV  Anti-ischemic medical therapy: Minimal Therapy (1 class of medications)  Non-Invasive Test Results: No non-invasive testing performed  Prior CABG: No previous CABG   Erin [name]

## 2016-03-11 NOTE — Progress Notes (Signed)
CRITICAL VALUE ALERT  Critical value received:  TROPONIN 40.82  Date of notification:  03/11/16  Time of notification:  0730  Critical value read back:Yes.    Nurse who received alert:  Chaney Born  MD notified (1st page):  DR [facility]  Time of first page: 1955  MD notified (2nd page):  Time of second page:  Responding MD:  DR [facility]  Time MD responded: 2000

## 2016-03-12 ENCOUNTER — Inpatient Hospital Stay (HOSPITAL_COMMUNITY): Payer: PRIVATE HEALTH INSURANCE

## 2016-03-12 DIAGNOSIS — Z720 Tobacco use: Secondary | ICD-10-CM

## 2016-03-12 LAB — TROPONIN I
Troponin I: >65 ng/mL (ref <0.03)
Troponin I: >65 ng/mL (ref <0.03)
Troponin I: >65 ng/mL (ref <0.03)

## 2016-03-12 LAB — BASIC METABOLIC PANEL
Anion gap: 9 (ref 5–15)
BUN: 12 mg/dL (ref 6–20)
CO2: 25 mmol/L (ref 22–32)
Calcium: 8.9 mg/dL (ref 8.9–10.3)
Chloride: 101 mmol/L (ref 101–111)
Creatinine, Ser: 0.88 mg/dL (ref 0.44–1.00)
GFR calc Af Amer: >60 mL/min (ref >60)
GFR calc non Af Amer: >60 mL/min (ref >60)
Glucose, Bld: 113 mg/dL — ABNORMAL HIGH (ref 65–99)
Potassium: 3.5 mmol/L (ref 3.5–5.1)
Sodium: 135 mmol/L (ref 135–145)

## 2016-03-12 LAB — CBC
HCT: 35.6 % — ABNORMAL LOW (ref 36.0–46.0)
Hemoglobin: 11.8 g/dL — ABNORMAL LOW (ref 12.0–15.0)
MCH: 28.4 pg (ref 26.0–34.0)
MCHC: 33.1 g/dL (ref 30.0–36.0)
MCV: 85.8 fL (ref 78.0–100.0)
Platelets: 325 10*3/uL (ref 150–400)
RBC: 4.15 MIL/uL (ref 3.87–5.11)
RDW: 15.2 % (ref 11.5–15.5)
WBC: 14 10*3/uL — ABNORMAL HIGH (ref 4.0–10.5)

## 2016-03-12 LAB — HEPARIN LEVEL (UNFRACTIONATED): Heparin Unfractionated: 0.3 IU/mL (ref 0.30–0.70)

## 2016-03-12 MED ORDER — LEVOFLOXACIN IN D5W 750 MG/150ML IV SOLN
750.0000 mg | INTRAVENOUS | Status: DC
  Administered 2016-03-12: 750 mg via INTRAVENOUS
  Filled 2016-03-12 (×2): qty 150

## 2016-03-12 MED ORDER — FUROSEMIDE 10 MG/ML IJ SOLN
40.0000 mg | Freq: Once | INTRAMUSCULAR | Status: AC
  Administered 2016-03-12: 40 mg via INTRAVENOUS
  Filled 2016-03-12: qty 4

## 2016-03-12 MED ORDER — LISINOPRIL 5 MG PO TABS
5.0000 mg | ORAL_TABLET | Freq: Every day | ORAL | Status: DC
  Administered 2016-03-12 – 2016-03-15 (×3): 5 mg via ORAL
  Filled 2016-03-12 (×4): qty 1

## 2016-03-12 MED ORDER — HEPARIN (PORCINE) IN NACL 100-0.45 UNIT/ML-% IJ SOLN
1050.0000 [IU]/h | INTRAMUSCULAR | Status: DC
  Administered 2016-03-12: 650 [IU]/h via INTRAVENOUS
  Administered 2016-03-13: 1050 [IU]/h via INTRAVENOUS
  Filled 2016-03-12 (×2): qty 250

## 2016-03-12 NOTE — Progress Notes (Signed)
ANTICOAGULATION CONSULT NOTE Pharmacy Consult for heparin Indication: chest pain/ACS - elevated trops post-cath   No Known Allergies  Patient Measurements: Weight: 120 lb (54.4 kg) Heparin Dosing Weight: 54 kg   Vital Signs: Temp: 98.3 F (36.8 C) (11/04 2130) Temp Source: Oral (11/04 2130) BP: 119/77 (11/04 2130) Pulse Rate: 96 (11/04 2130)  Labs:  Recent Labs  03/11/16 1649  03/11/16 1700 03/12/16 0017 03/12/16 0536 03/12/16 1258 03/12/16 2023  HGB 12.2  --  14.5  --  11.8*  --   --   HCT 36.0  --  42.6  --  35.6*  --   --   PLT  --   --  232  --  325  --   --   HEPARINUNFRC  --   --   --   --   --   --  0.30  CREATININE 0.60  --   --   --  0.88  --   --   TROPONINI  --   < > 40.82* >65.00* >65.00* >65.00*  --   < > = values in this interval not displayed.  Estimated Creatinine Clearance: 53.1 mL/min (by C-G formula based on SCr of 0.88 mg/dL).   Medical History: Past Medical History:  Diagnosis Date  . Benign essential HTN 09/06/2015  . GERD (gastroesophageal reflux disease)   . GERD (gastroesophageal reflux disease) 09/06/2015  . Hyperlipidemia 09/06/2015  . Hypertension   . Wears dentures    top  . Wears glasses     Assessment: 55 yo female admitted with chest pain. Taken for cath 11/3 and DES placed to Prox-Mid Cx. Has residual lesion in RCA with 80% stenosis and staged PCI anticipated on Monday. Patient with elevated troponin > 65 x2 and pharmacy consulted to dose heparin. Due to post-PCI from 11/3 and SQ heparin given this AM, will not give bolus. Hgb downtrending slowly 14.5 to 11.8, but stable. Plts within normal limits. No overt signs or symptoms of bleeding noted.   Initial heparin level = 0.30  Goal of Therapy:  Heparin level 0.3-0.7 units/ml Monitor platelets by anticoagulation protocol: Yes   Plan:  Heparin to 700 units / hr Follow up AM labs  Thank you Anette Guarneri, PharmD [phone] 03/12/16 9:42 PM

## 2016-03-12 NOTE — Progress Notes (Signed)
CXR with possible infiltrate.  Uptrend in WBC. Patient reports chills, cough, SOB. Suspected pneumonia. She still meets definition of community acquired pneumonia as onset < 48hrs during admission. We will start levaquin 750mg  IV daily, obtain blood cultures x 2   Erin Abts MD

## 2016-03-12 NOTE — Progress Notes (Signed)
Patient Name: Erin [name] Date of Encounter: 03/12/2016  Primary Cardiologist: Erin [name]/  Erin Sims, NP   @ [facility] Problem List     Principal Problem:   NSTEMI (non-ST elevated myocardial infarction) [facility]) Active Problems:   Benign essential HTN   Hyperlipidemia   Tobacco abuse    Subjective   Feeling well. No chest pain, sob or palpitations.   Inpatient Medications    Scheduled Meds: . aspirin  81 mg Oral Daily  . atorvastatin  80 mg Oral q1800  . heparin  5,000 Units Subcutaneous Q8H  . metoprolol tartrate  25 mg Oral BID  . sodium chloride flush  3 mL Intravenous Q12H  . ticagrelor  90 mg Oral BID   Continuous Infusions: . nitroGLYCERIN     PRN Meds: sodium chloride, acetaminophen, calcium carbonate, LORazepam, morphine injection, nitroGLYCERIN, ondansetron (ZOFRAN) IV, sodium chloride flush   Vital Signs    Vitals:   03/11/16 1826 03/11/16 1922 03/11/16 2049 03/12/16 0500  BP: 137/84   102/66  Pulse:    76  Resp: 17   20  Temp: 98.7 F (37.1 C) 97.9 F (36.6 C)  98.2 F (36.8 C)  TempSrc: Oral Oral  Oral  SpO2: 99%   94%  Weight:   121 lb 11.2 oz (55.2 kg) 120 lb (54.4 kg)    Intake/Output Summary (Last 24 hours) at 03/12/16 0724 Last data filed at 03/12/16 0520  Gross per 24 hour  Intake                0 ml  Output             2050 ml  Net            -2050 ml   Filed Weights   03/11/16 2049 03/12/16 0500  Weight: 121 lb 11.2 oz (55.2 kg) 120 lb (54.4 kg)    Physical Exam    GEN: Well nourished, well developed, in no acute distress.  HEENT: Grossly normal.  Neck: Supple, no JVD, carotid bruits, or masses. Cardiac: RRR, no murmurs, rubs, or gallops. No clubbing, cyanosis, edema.  Radials/DP/PT 2+ and equal bilaterally. R radial cath site stable.  Respiratory:  Respirations regular and unlabored, clear to auscultation bilaterally. GI: Soft, nontender, nondistended, BS + x 4. MS: no deformity or atrophy. Skin: warm and  dry, no rash. Neuro:  Strength and sensation are intact. Psych: AAOx3.  Normal affect.  Labs    CBC  Recent Labs  03/11/16 1700 03/12/16 0536  WBC 9.5 14.0*  HGB 14.5 11.8*  HCT 42.6 35.6*  MCV 83.7 85.8  PLT 232 [id number]   Basic Metabolic Panel  Recent Labs  03/11/16 1649 03/12/16 0536  NA 140 135  K 3.9 3.5  CL 105 101  CO2  --  25  GLUCOSE 123* 113*  BUN 12 12  CREATININE 0.60 0.88  CALCIUM  --  8.9   Liver Function Tests No results for input(s): AST, ALT, ALKPHOS, BILITOT, PROT, ALBUMIN in the last 72 hours. No results for input(s): LIPASE, AMYLASE in the last 72 hours. Cardiac Enzymes  Recent Labs  03/11/16 1700 03/12/16 0017 03/12/16 0536  TROPONINI 40.82* >65.00* >65.00*   BNP Invalid input(s): POCBNP D-Dimer No results for input(s): DDIMER in the last 72 hours. Hemoglobin A1C No results for input(s): HGBA1C in the last 72 hours. Fasting Lipid Panel No results for input(s): CHOL, HDL, LDLCALC, TRIG, CHOLHDL, LDLDIRECT in the last 72 hours. Thyroid Function Tests  No results for input(s): TSH, T4TOTAL, T3FREE, THYROIDAB in the last 72 hours.  Invalid input(s): FREET3  Telemetry    Sinus rhythm with PVCs- Personally Reviewed  ECG    03/12/16 NSR at rate of 74 bpm, TWI in lead II and V6 - Personally Reviewed  Radiology    No results found.  Cardiac Studies   Coronary Stent Intervention  Left Heart Cath and Coronary Angiography  Conclusion     Prox-Mid Cx lesion, 100 %stenosed.  A STENT SYNERGY DES 3X12 drug eluting stent was successfully placed, and overlaps previously placed stent.  Post intervention, there is a 0% residual stenosis.  Dist RCA lesion, 80 %stenosed. - Would consider treatment in a staged fashion, could either be done during this hospitalization or as an outpatient.  The left ventricular ejection fraction is 45-50% by visual estimate.  There is severe (4+) mitral regurgitation.  LV end diastolic pressure is  severely elevated.    Pathophysiologically, this is equivalent to a non-ST elevation MI, however with no EKG changes, is technically non-STEMI.  She will be admitted to [facility] post procedure unit/stepdown. Was chest pain-free upon completion of the procedure.  Plan:  TR band removal per protocol  Continue Angiomax until current bag completed  Nitroglycerin infusion weaned from 40 g/min down to 20. Would continue to wean overnight.  1dose IV Lasix 40 mg for elevated LVEDP and MR  Order home dose metoprolol for this evening, and start ACE inhibitor tomorrow.  Dual antiplatelet therapy for minimum of 3 months, could potentially consider stopping aspirin after 3 months  We'll need 2-D echocardiogram to clarify extent of MR  Based on the existing lesion, and MR, would not fast-track discharge     Patient Profile     Erin [name] is a 55 year old female with a past medical history of HTN, ongoing tobacco abuse and HLD who presented to [facility] with chest pain and troponin of 0.30 and transferred here for cath.   Assessment & Plan    1. NSTEMI - Directly taken to cath lab that showed 100% pro-mid Cx s/p PTCA and DES. Cath report says this overlaps previously placed stent however no prior hx of stent placement. She does have 80% RCA stenosed. Now troponin > 65 x 2. EKG stable. No chest pain. Will start heparin. Plan for staged PCI. EF 45-50% and 4+ MR by cath. Pending echo.   2. HTN - Stable. Continue metoprolol 25mg  BID.   3. HLD - 01/15/2016: Cholesterol 273; HDL 53; LDL Cholesterol 146; Triglycerides 371; VLDL 74  - Continue high dose statin. Will update lipid panel.   4. Tobacco smoking. - Cessation advised.   Erin Soho, PA  03/12/2016, 7:24 AM   Patient seen and discussed with PA Erin [name], I agree with his documentation. Admitted with NSTEMI. Cath yesterday with 100% LCX lesion s/p DES, 80% RCA with plans for staged PCI. Cath mentions 4+MR, we  will f/u echo results, MR was mild by echo 12/2015. Troponin 40 on admission, trended to >65. Due to the degree and continued uptrend in troponin concern remains about her RCA lesion, we will plan for staged procedure Monday. Restart anticoagulation, continue medical therapy with ASA, atorva 80, lopressor, ticagrelor. Start low dose ACE-I as bp's are trending up. Some SOB this AM, she has some crackles and elevated jvd, will give IV lasix 40mg  once. Check CXR, mild uptrend in WBC likely reactive to MI but will monitor. Decrease in Hgb postprocedure, contniue to monitor trend.  Zandra Abts MD

## 2016-03-12 NOTE — Progress Notes (Signed)
ANTICOAGULATION CONSULT NOTE - Initial Consult  Pharmacy Consult for heparin Indication: chest pain/ACS - elevated trops post-cath   No Known Allergies  Patient Measurements: Weight: 120 lb (54.4 kg) Heparin Dosing Weight: 54 kg   Vital Signs: Temp: 99 F (37.2 C) (11/04 0724) Temp Source: Oral (11/04 0724) BP: 146/82 (11/04 0724) Pulse Rate: 80 (11/04 0724)  Labs:  Recent Labs  03/11/16 1649 03/11/16 1700 03/12/16 0017 03/12/16 0536  HGB 12.2 14.5  --  11.8*  HCT 36.0 42.6  --  35.6*  PLT  --  232  --  325  CREATININE 0.60  --   --  0.88  TROPONINI  --  40.82* >65.00* >65.00*    Estimated Creatinine Clearance: 53.1 mL/min (by C-G formula based on SCr of 0.88 mg/dL).   Medical History: Past Medical History:  Diagnosis Date  . Benign essential HTN 09/06/2015  . GERD (gastroesophageal reflux disease)   . GERD (gastroesophageal reflux disease) 09/06/2015  . Hyperlipidemia 09/06/2015  . Hypertension   . Wears dentures    top  . Wears glasses     Assessment: 55 yo female admitted with chest pain. Taken for cath 11/3 and DES placed to Prox-Mid Cx. Has residual lesion in RCA with 80% stenosis and staged PCI anticipated on Monday. Patient with elevated troponin > 65 x2 and pharmacy consulted to dose heparin. Due to post-PCI from 11/3 and SQ heparin given this AM, will not give bolus. Hgb downtrending slowly 14.5 to 11.8, but stable. Plts within normal limits. No overt signs or symptoms of bleeding noted.   Goal of Therapy:  Heparin level 0.3-0.7 units/ml Monitor platelets by anticoagulation protocol: Yes   Plan:  Start heparin infusion at 650 units/hr  - No bolus  Check anti-Xa level in 6 hours and daily while on heparin Continue to monitor H&H and platelets - CBC daily.  Monitor for s/s bleeding.   Argie Ramming, PharmD Pharmacy Resident  Pager [phone] 03/12/16 8:38 AM

## 2016-03-12 NOTE — Progress Notes (Signed)
CARDIAC REHAB PHASE I   PRE:  Rate/Rhythm: 78 SR  BP:  Supine:   Sitting: 150/89  Standing:    SaO2: 97% RA  MODE:  Ambulation: 400 ft   POST:  Rate/Rhythem: 84 Sr  BP:  Supine: 145/99 Sitting:   Standing:    SaO2: 95% RA  0845-0900 Patient ambulated 400 ft with assist x1. Pt c/o CP,which she rated a "6/10" on the pain scale mid-way through the walk. Returned to room where BP was elevated 145/99. To bed after walk where pain began to subside to "3/10" on the pain scale. Informed patient's RN about CP with ambulation. Will f/u after PCI on Monday 03/14/16.  Seward Carol, MS, ACSM CEP

## 2016-03-12 NOTE — Progress Notes (Signed)
Pt c/o pain in left IV, when trying to take off dressing, very difficult to unstick when using alcohol prep. After removing pt has ecchymosis on top of left hand. Cont to monitor. Carroll Kinds RN

## 2016-03-13 ENCOUNTER — Inpatient Hospital Stay (HOSPITAL_COMMUNITY): Payer: PRIVATE HEALTH INSURANCE

## 2016-03-13 DIAGNOSIS — J181 Lobar pneumonia, unspecified organism: Secondary | ICD-10-CM

## 2016-03-13 DIAGNOSIS — Z720 Tobacco use: Secondary | ICD-10-CM

## 2016-03-13 DIAGNOSIS — E7800 Pure hypercholesterolemia, unspecified: Secondary | ICD-10-CM

## 2016-03-13 DIAGNOSIS — I255 Ischemic cardiomyopathy: Secondary | ICD-10-CM

## 2016-03-13 LAB — BASIC METABOLIC PANEL
Anion gap: 10 (ref 5–15)
BUN: 9 mg/dL (ref 6–20)
CO2: 28 mmol/L (ref 22–32)
Calcium: 8.9 mg/dL (ref 8.9–10.3)
Chloride: 100 mmol/L — ABNORMAL LOW (ref 101–111)
Creatinine, Ser: 0.77 mg/dL (ref 0.44–1.00)
GFR calc Af Amer: >60 mL/min (ref >60)
GFR calc non Af Amer: >60 mL/min (ref >60)
Glucose, Bld: 114 mg/dL — ABNORMAL HIGH (ref 65–99)
Potassium: 2.8 mmol/L — ABNORMAL LOW (ref 3.5–5.1)
Sodium: 138 mmol/L (ref 135–145)

## 2016-03-13 LAB — CBC
HCT: 37.1 % (ref 36.0–46.0)
Hemoglobin: 12.6 g/dL (ref 12.0–15.0)
MCH: 28.4 pg (ref 26.0–34.0)
MCHC: 34 g/dL (ref 30.0–36.0)
MCV: 83.7 fL (ref 78.0–100.0)
Platelets: 349 10*3/uL (ref 150–400)
RBC: 4.43 MIL/uL (ref 3.87–5.11)
RDW: 14.3 % (ref 11.5–15.5)
WBC: 14.7 10*3/uL — ABNORMAL HIGH (ref 4.0–10.5)

## 2016-03-13 LAB — HEPARIN LEVEL (UNFRACTIONATED)
Heparin Unfractionated: 0.22 IU/mL — ABNORMAL LOW (ref 0.30–0.70)
Heparin Unfractionated: 0.26 IU/mL — ABNORMAL LOW (ref 0.30–0.70)
Heparin Unfractionated: 0.12 IU/mL — ABNORMAL LOW (ref 0.30–0.70)

## 2016-03-13 LAB — MAGNESIUM: Magnesium: 2.1 mg/dL (ref 1.7–2.4)

## 2016-03-13 MED ORDER — POTASSIUM CHLORIDE CRYS ER 20 MEQ PO TBCR
40.0000 meq | EXTENDED_RELEASE_TABLET | Freq: Three times a day (TID) | ORAL | Status: AC
  Administered 2016-03-13 (×3): 40 meq via ORAL
  Filled 2016-03-13 (×3): qty 2

## 2016-03-13 MED ORDER — LEVOFLOXACIN 500 MG PO TABS
500.0000 mg | ORAL_TABLET | Freq: Every day | ORAL | Status: DC
  Administered 2016-03-13 – 2016-03-15 (×3): 500 mg via ORAL
  Filled 2016-03-13 (×3): qty 1

## 2016-03-13 MED ORDER — SODIUM CHLORIDE 0.9 % IV SOLN
250.0000 mL | INTRAVENOUS | Status: DC | PRN
  Administered 2016-03-14: 250 mL via INTRAVENOUS

## 2016-03-13 MED ORDER — ASPIRIN 81 MG PO CHEW
81.0000 mg | CHEWABLE_TABLET | ORAL | Status: AC
  Administered 2016-03-14: 81 mg via ORAL
  Filled 2016-03-13: qty 1

## 2016-03-13 MED ORDER — SODIUM CHLORIDE 0.9% FLUSH
3.0000 mL | Freq: Two times a day (BID) | INTRAVENOUS | Status: DC
  Administered 2016-03-13: 3 mL via INTRAVENOUS

## 2016-03-13 MED ORDER — SODIUM CHLORIDE 0.9% FLUSH: 3.0000 mL | INTRAVENOUS | Status: DC | PRN

## 2016-03-13 MED ORDER — SODIUM CHLORIDE 0.9 % IV SOLN
INTRAVENOUS | Status: DC
  Administered 2016-03-14: 250 mL via INTRAVENOUS
  Administered 2016-03-14: 05:00:00 via INTRAVENOUS

## 2016-03-13 NOTE — Progress Notes (Signed)
Patient c/o burning sensation from mid chest to her throat.  MD on call Dr. Raiford Simmonds made aware. No new orders. Patient requested and given Ativan 1mg  at 2331; she slept and woke up with no more complaints.

## 2016-03-13 NOTE — Progress Notes (Addendum)
Patient Name: Erin [name] Date of Encounter: 03/13/2016  Primary Cardiologist: Cleotilde Neer, NP  @ [facility] Problem List     Principal Problem:   NSTEMI (non-ST elevated myocardial infarction) [facility]) Active Problems:   Benign essential HTN   Hyperlipidemia   Tobacco abuse     Subjective   Had some mild chest burning despite IV nitroglycerin and IV heparin. Feeling a little bit better this morning. No shortness of breath.  Inpatient Medications    Scheduled Meds: . aspirin  81 mg Oral Daily  . atorvastatin  80 mg Oral q1800  . levofloxacin (LEVAQUIN) IV  750 mg Intravenous Q24H  . lisinopril  5 mg Oral Daily  . metoprolol tartrate  25 mg Oral BID  . potassium chloride  40 mEq Oral TID  . sodium chloride flush  3 mL Intravenous Q12H  . ticagrelor  90 mg Oral BID   Continuous Infusions: . heparin 800 Units/hr (03/13/16 0700)  . nitroGLYCERIN     PRN Meds: sodium chloride, acetaminophen, calcium carbonate, LORazepam, morphine injection, nitroGLYCERIN, ondansetron (ZOFRAN) IV, sodium chloride flush   Vital Signs    Vitals:   03/12/16 2130 03/12/16 2157 03/13/16 0457 03/13/16 0742  BP: 119/77  101/73   Pulse: 96 100 (!) 102   Resp: (!) 23  (!) 27 (!) 22  Temp: 98.3 F (36.8 C)  98.4 F (36.9 C) 97.9 F (36.6 C)  TempSrc: Oral  Oral Oral  SpO2: 99%  98%   Weight:   115 lb (52.2 kg)     Intake/Output Summary (Last 24 hours) at 03/13/16 0944 Last data filed at 03/12/16 2159  Gross per 24 hour  Intake              363 ml  Output              300 ml  Net               63 ml   Filed Weights   03/11/16 2049 03/12/16 0500 03/13/16 0457  Weight: 121 lb 11.2 oz (55.2 kg) 120 lb (54.4 kg) 115 lb (52.2 kg)    Physical Exam    GEN: Thin, in no acute distress.  HEENT: Grossly normal.  Neck: Supple, no JVD, carotid bruits, or masses. Cardiac: RRR, Soft apical systolic murmur,no rubs, or gallops. No clubbing, cyanosis, edema.   Radials/DP/PT 2+ and equal bilaterally.  Respiratory:  Respirations regular and unlabored, mild wheeze heard bilaterally  GI: Soft, nontender, nondistended, BS + x 4. MS: no deformity or atrophy. Skin: warm and dry, no rash. Neuro:  Strength and sensation are intact. Psych: AAOx3.  Normal affect.  Labs    CBC  Recent Labs  03/12/16 0536 03/13/16 0336  WBC 14.0* 14.7*  HGB 11.8* 12.6  HCT 35.6* 37.1  MCV 85.8 83.7  PLT 325 0000000   Basic Metabolic Panel  Recent Labs  03/12/16 0536 03/13/16 0336  NA 135 138  K 3.5 2.8*  CL 101 100*  CO2 25 28  GLUCOSE 113* 114*  BUN 12 9  CREATININE 0.88 0.77  CALCIUM 8.9 8.9   Cardiac Enzymes  Recent Labs  03/12/16 0017 03/12/16 0536 03/12/16 1258  TROPONINI >65.00* >65.00* >65.00*    Telemetry    Sinus rhythm/mild sinus tachycardia no adverse arrhythmias - Personally Reviewed  ECG     Sinus rhythm, lateral infarct, mild ST segment depression laterally precordial leads- Personally Reviewed  Radiology    Dg Chest  Port 1 View  Result Date: 03/12/2016 CLINICAL DATA:  Pt having SOB, no cp. Hx of hypertension. Cardiac cath 03/11/16. Smoker for 11 years. EXAM: PORTABLE CHEST 1 VIEW COMPARISON:  03/11/2016 FINDINGS: Opacity has developed at the right lung base since the prior study. This is likely atelectasis. Consider pneumonia if there are consistent clinical symptoms. Remainder of the lungs is clear. No convincing pleural effusion. No pneumothorax. Heart, mediastinum and hila are unremarkable. IMPRESSION: 1. New right lung base opacity most likely atelectasis. Pneumonia is possible. Electronically Signed   By: Lajean Manes M.D.   On: 03/12/2016 09:19    Cardiac Studies   Cath 03/11/16:  Prox-Mid Cx lesion, 100 %stenosed.  A STENT SYNERGY DES 3X12 drug eluting stent was successfully placed, and overlaps previously placed stent.  Post intervention, there is a 0% residual stenosis.  Dist RCA lesion, 80 %stenosed. - Would  consider treatment in a staged fashion, could either be done during this hospitalization or as an outpatient.  The left ventricular ejection fraction is 45-50% by visual estimate.  There is severe (4+) mitral regurgitation.  LV end diastolic pressure is severely elevated.    Pathophysiologically, this is equivalent to a non-ST elevation MI, however with no EKG changes, is technically non-STEMI.  She will be admitted to [facility] post procedure unit/stepdown. Was chest pain-free upon completion of the procedure.  Plan:  TR band removal per protocol  Continue Angiomax until current bag completed  Nitroglycerin infusion weaned from 40 g/min down to 20. Would continue to wean overnight.  1dose IV Lasix 40 mg for elevated LVEDP and MR  Order home dose metoprolol for this evening, and start ACE inhibitor tomorrow.  Dual antiplatelet therapy for minimum of 3 months, could potentially consider stopping aspirin after 3 months  We'll need 2-D echocardiogram to clarify extent of MR  Based on the existing lesion, and MR, would not fast-track discharge   Glenetta Hew, M.D., M.S.  Patient Profile     55 year old with non-ST elevation MI with plans for staged PCI on Monday.  Assessment & Plan    Non-STEMI  - She was taken directly to the Cath Lab and this showed a 100% mid circumflex lesion status post DES. In reviewing the Cath Lab report, this overlaps a previously placed stent however she has no history of prior stent placement? Perhaps this was calcification?  - Troponin greater than 65 (high risk)  - Heparin reinitiated  - Plan is for staged PCI to RCA on Monday. Cath orders will be placed.  - Currently on dual antiplatelet therapy.  Mitral regurgitation  - Cardiac catheterization mentions 4+, question ischemic MR. I don't hear much of a murmur on exam.  - Current echocardiogram pending.  - Prior echocardiogram demonstrated EF of 60% with mild mitral regurgitation. This was  on 01/01/16.  Community-acquired pneumonia   - Dr. Harl Bowie checked an x-ray and started her on levofloxacin for right lung opacity  Hypokalemia  - Potassium 2.8 repleting  Essential hypertension  - Lisinopril, metoprolol  Hyperlipidemia  - Statin therapy  Tobacco cessation discussed  Signed, Candee Furbish, MD  03/13/2016, 9:44 AM

## 2016-03-13 NOTE — Progress Notes (Signed)
  Echocardiogram 2D Echocardiogram has been performed.  Erin [name] 03/13/2016, 5:55 PM

## 2016-03-13 NOTE — Progress Notes (Signed)
ANTICOAGULATION CONSULT NOTE Pharmacy Consult for heparin Indication: chest pain/ACS - elevated trops post-cath   No Known Allergies  Patient Measurements: Weight: 115 lb (52.2 kg) Heparin Dosing Weight: 54 kg   Vital Signs: Temp: 98.4 F (36.9 C) (11/05 0457) Temp Source: Oral (11/05 0457) BP: 101/73 (11/05 0457) Pulse Rate: 102 (11/05 0457)  Labs:  Recent Labs  03/11/16 1649  03/11/16 1700 03/12/16 0017 03/12/16 0536 03/12/16 1258 03/12/16 2023 03/13/16 0336  HGB 12.2  --  14.5  --  11.8*  --   --  12.6  HCT 36.0  --  42.6  --  35.6*  --   --  37.1  PLT  --   --  232  --  325  --   --  349  HEPARINUNFRC  --   --   --   --   --   --  0.30 0.22*  CREATININE 0.60  --   --   --  0.88  --   --  0.77  TROPONINI  --   < > 40.82* >65.00* >65.00* >65.00*  --   --   < > = values in this interval not displayed.  Estimated Creatinine Clearance: 58.4 mL/min (by C-G formula based on SCr of 0.77 mg/dL).  Assessment: 55 yo female admitted with chest pain. Taken for cath 11/3 and DES placed to Prox-Mid Cx. Has residual lesion in RCA with 80% stenosis and staged PCI anticipated on Monday. Patient with elevated troponin > 65 x2 and pharmacy consulted to dose heparin. Heparin level down to subtherapeutic (0.22) on gtt at 700 units/hr. No issues with line or bleeding reported per RN. CBC stable.  Goal of Therapy:  Heparin level 0.3-0.7 units/ml Monitor platelets by anticoagulation protocol: Yes   Plan:  Increase heparin to 800 units / hr F/u 6 hr heparin level  Sherlon Handing, PharmD, BCPS Clinical pharmacist, pager [phone] 03/13/16 5:19 AM

## 2016-03-13 NOTE — Progress Notes (Signed)
ANTICOAGULATION CONSULT NOTE Pharmacy Consult for heparin Indication: chest pain/ACS - elevated trops post-cath   No Known Allergies  Patient Measurements: Weight: 115 lb (52.2 kg) Heparin Dosing Weight: 54 kg   Vital Signs: Temp: 99.1 F (37.3 C) (11/05 1502) Temp Source: Oral (11/05 1502) BP: 115/72 (11/05 1630) Pulse Rate: 102 (11/05 1502)  Labs:  Recent Labs  03/11/16 1649  03/11/16 1700 03/12/16 0017 03/12/16 0536 03/12/16 1258  03/13/16 0336 03/13/16 1106 03/13/16 1742  HGB 12.2  --  14.5  --  11.8*  --   --  12.6  --   --   HCT 36.0  --  42.6  --  35.6*  --   --  37.1  --   --   PLT  --   --  232  --  325  --   --  349  --   --   HEPARINUNFRC  --   --   --   --   --   --   < > 0.22* 0.26* 0.12*  CREATININE 0.60  --   --   --  0.88  --   --  0.77  --   --   TROPONINI  --   < > 40.82* >65.00* >65.00* >65.00*  --   --   --   --   < > = values in this interval not displayed.  Estimated Creatinine Clearance: 58.4 mL/min (by C-G formula based on SCr of 0.77 mg/dL).  Assessment: 55 yo female admitted with chest pain. Taken for cath 11/3 and DES placed to Prox-Mid Cx. Has residual lesion in RCA with 80% stenosis and staged PCI anticipated on Monday. Patient with elevated troponin > 65 x2 and pharmacy consulted to dose heparin. H  Heparin level subtherapeutic this evening  Goal of Therapy:  Heparin level 0.3-0.7 units/ml Monitor platelets by anticoagulation protocol: Yes   Plan:  Increase heparin to 1050 units / hr Daily heparin level and CBC Monitor for s/s bleeding   Thank you Anette Guarneri, PharmD [phone] 03/13/16 6:39 PM

## 2016-03-13 NOTE — Progress Notes (Signed)
ANTICOAGULATION CONSULT NOTE Pharmacy Consult for heparin Indication: chest pain/ACS - elevated trops post-cath   No Known Allergies  Patient Measurements: Weight: 115 lb (52.2 kg) Heparin Dosing Weight: 54 kg   Vital Signs: Temp: 97.9 F (36.6 C) (11/05 0742) Temp Source: Oral (11/05 0742) BP: 101/73 (11/05 0457) Pulse Rate: 102 (11/05 0457)  Labs:  Recent Labs  03/11/16 1649  03/11/16 1700 03/12/16 0017 03/12/16 0536 03/12/16 1258 03/12/16 2023 03/13/16 0336 03/13/16 1106  HGB 12.2  --  14.5  --  11.8*  --   --  12.6  --   HCT 36.0  --  42.6  --  35.6*  --   --  37.1  --   PLT  --   --  232  --  325  --   --  349  --   HEPARINUNFRC  --   --   --   --   --   --  0.30 0.22* 0.26*  CREATININE 0.60  --   --   --  0.88  --   --  0.77  --   TROPONINI  --   < > 40.82* >65.00* >65.00* >65.00*  --   --   --   < > = values in this interval not displayed.  Estimated Creatinine Clearance: 58.4 mL/min (by C-G formula based on SCr of 0.77 mg/dL).  Assessment: 55 yo female admitted with chest pain. Taken for cath 11/3 and DES placed to Prox-Mid Cx. Has residual lesion in RCA with 80% stenosis and staged PCI anticipated on Monday. Patient with elevated troponin > 65 x2 and pharmacy consulted to dose heparin. Heparin level subtherapeutic (0.26) on gtt at 800 units/hr. CBC stable. No s/s bleeding noted.  Goal of Therapy:  Heparin level 0.3-0.7 units/ml Monitor platelets by anticoagulation protocol: Yes   Plan:  Increase heparin to 900 units / hr F/u 6 hr heparin level Daily heparin level and CBC Monitor for s/s bleeding   Argie Ramming, PharmD Pharmacy Resident  Pager [phone] 03/13/16 12:23 PM

## 2016-03-14 ENCOUNTER — Encounter (HOSPITAL_COMMUNITY): Payer: Self-pay | Admitting: General Practice

## 2016-03-14 ENCOUNTER — Encounter (HOSPITAL_COMMUNITY)
Admission: EM | Disposition: A | Discharge status: Home / Self Care | Payer: Self-pay | Source: Other Acute Inpatient Hospital | Attending: Cardiology

## 2016-03-14 HISTORY — PX: CARDIAC CATHETERIZATION: SHX172

## 2016-03-14 HISTORY — PX: CORONARY ANGIOPLASTY WITH STENT PLACEMENT: SHX49

## 2016-03-14 LAB — ECHOCARDIOGRAM COMPLETE
E decel time: 177 msec
E/e' ratio: 14.21
FS: 16 % — AB (ref 28–44)
IVS/LV PW RATIO, ED: 0.9
LA ID, A-P, ES: 35 mm
LA diam end sys: 35 mm
LA diam index: 2.36 cm/m2
LA vol A4C: 35.8 ml
LA vol index: 29.9 mL/m2
LA vol: 44.2 mL
LV E/e' medial: 14.21
LV E/e'average: 14.21
LV PW d: 9.95 mm — AB (ref 0.6–1.1)
LV e' LATERAL: 5.77 cm/s
LVOT area: 2.84 cm2
LVOT diameter: 19 mm
Lateral S' vel: 11.6 cm/s
MV Dec: 177
MV Peak grad: 3 mmHg
MV pk A vel: 67.9 m/s
MV pk E vel: 82 m/s
PISA EROA: 0.04 cm2
RV sys press: 29 mmHg
Reg peak vel: 254 cm/s
TAPSE: 16.5 mm
TDI e' lateral: 5.77
TDI e' medial: 5
TR max vel: 254 cm/s
VTI: 158 cm
Weight: 1840 oz

## 2016-03-14 LAB — CBC
HCT: 39.9 % (ref 36.0–46.0)
Hemoglobin: 13.5 g/dL (ref 12.0–15.0)
MCH: 28.6 pg (ref 26.0–34.0)
MCHC: 33.8 g/dL (ref 30.0–36.0)
MCV: 84.5 fL (ref 78.0–100.0)
Platelets: 366 10*3/uL (ref 150–400)
RBC: 4.72 MIL/uL (ref 3.87–5.11)
RDW: 14.5 % (ref 11.5–15.5)
WBC: 14.3 10*3/uL — ABNORMAL HIGH (ref 4.0–10.5)

## 2016-03-14 LAB — BASIC METABOLIC PANEL
Anion gap: 10 (ref 5–15)
BUN: 8 mg/dL (ref 6–20)
CO2: 21 mmol/L — ABNORMAL LOW (ref 22–32)
Calcium: 9.4 mg/dL (ref 8.9–10.3)
Chloride: 105 mmol/L (ref 101–111)
Creatinine, Ser: 0.86 mg/dL (ref 0.44–1.00)
GFR calc Af Amer: >60 mL/min (ref >60)
GFR calc non Af Amer: >60 mL/min (ref >60)
Glucose, Bld: 102 mg/dL — ABNORMAL HIGH (ref 65–99)
Potassium: 3.8 mmol/L (ref 3.5–5.1)
Sodium: 136 mmol/L (ref 135–145)

## 2016-03-14 LAB — C DIFFICILE QUICK SCREEN W PCR REFLEX
C Diff antigen: NEGATIVE
C Diff interpretation: NOT DETECTED
C Diff toxin: NEGATIVE

## 2016-03-14 LAB — PROTIME-INR
INR: 1.04
Prothrombin Time: 13.7 seconds (ref 11.4–15.2)

## 2016-03-14 LAB — HEPARIN LEVEL (UNFRACTIONATED)
Heparin Unfractionated: 0.5 IU/mL (ref 0.30–0.70)
Heparin Unfractionated: 0.46 IU/mL (ref 0.30–0.70)

## 2016-03-14 LAB — POCT ACTIVATED CLOTTING TIME: Activated Clotting Time: 312 seconds

## 2016-03-14 SURGERY — CORONARY STENT INTERVENTION
Anesthesia: LOCAL

## 2016-03-14 MED ORDER — ADENOSINE (DIAGNOSTIC) 140MCG/KG/MIN
INTRAVENOUS | Status: DC | PRN
  Administered 2016-03-14: 140 ug/kg/min via INTRAVENOUS

## 2016-03-14 MED ORDER — MIDAZOLAM HCL 2 MG/2ML IJ SOLN
INTRAMUSCULAR | Status: AC
  Filled 2016-03-14: qty 2

## 2016-03-14 MED ORDER — SODIUM CHLORIDE 0.9 % IV SOLN
INTRAVENOUS | Status: DC | PRN
  Administered 2016-03-14: 1.75 mg/kg/h via INTRAVENOUS

## 2016-03-14 MED ORDER — NITROGLYCERIN 1 MG/10 ML FOR IR/CATH LAB
INTRA_ARTERIAL | Status: DC | PRN
  Administered 2016-03-14 (×3): 200 ug via INTRACORONARY

## 2016-03-14 MED ORDER — ADENOSINE 12 MG/4ML IV SOLN
INTRAVENOUS | Status: AC
  Filled 2016-03-14: qty 4

## 2016-03-14 MED ORDER — IOPAMIDOL (ISOVUE-370) INJECTION 76%
INTRAVENOUS | Status: DC | PRN
  Administered 2016-03-14: 170 mL via INTRA_ARTERIAL

## 2016-03-14 MED ORDER — NITROGLYCERIN 1 MG/10 ML FOR IR/CATH LAB
INTRA_ARTERIAL | Status: AC
  Filled 2016-03-14: qty 10

## 2016-03-14 MED ORDER — ANGIOPLASTY BOOK
Freq: Once | Status: AC
  Administered 2016-03-15
  Filled 2016-03-14: qty 1

## 2016-03-14 MED ORDER — FENTANYL CITRATE (PF) 100 MCG/2ML IJ SOLN
INTRAMUSCULAR | Status: AC
  Filled 2016-03-14: qty 2

## 2016-03-14 MED ORDER — SODIUM CHLORIDE 0.9 % WEIGHT BASED INFUSION: 1.0000 mL/kg/h | INTRAVENOUS | Status: AC

## 2016-03-14 MED ORDER — HEPARIN SODIUM (PORCINE) 1000 UNIT/ML IJ SOLN
INTRAMUSCULAR | Status: AC
  Filled 2016-03-14: qty 1

## 2016-03-14 MED ORDER — LIDOCAINE HCL (PF) 1 % IJ SOLN
INTRAMUSCULAR | Status: AC
  Filled 2016-03-14: qty 30

## 2016-03-14 MED ORDER — HEART ATTACK BOUNCING BOOK
Freq: Once | Status: AC
  Administered 2016-03-15
  Filled 2016-03-14: qty 1

## 2016-03-14 MED ORDER — BIVALIRUDIN BOLUS VIA INFUSION - CUPID
INTRAVENOUS | Status: DC | PRN
  Administered 2016-03-14: 38.25 mg via INTRAVENOUS

## 2016-03-14 MED ORDER — SODIUM CHLORIDE 0.9% FLUSH: 3.0000 mL | INTRAVENOUS | Status: DC | PRN

## 2016-03-14 MED ORDER — IOPAMIDOL (ISOVUE-370) INJECTION 76%
INTRAVENOUS | Status: AC
  Filled 2016-03-14: qty 100

## 2016-03-14 MED ORDER — LOPERAMIDE HCL 2 MG PO CAPS
2.0000 mg | ORAL_CAPSULE | ORAL | Status: DC | PRN
  Administered 2016-03-15: 2 mg via ORAL
  Filled 2016-03-14: qty 1

## 2016-03-14 MED ORDER — IOPAMIDOL (ISOVUE-370) INJECTION 76%
INTRAVENOUS | Status: AC
  Filled 2016-03-14: qty 50

## 2016-03-14 MED ORDER — SODIUM CHLORIDE 0.9% FLUSH: 3.0000 mL | Freq: Two times a day (BID) | INTRAVENOUS | Status: DC

## 2016-03-14 MED ORDER — SODIUM CHLORIDE 0.9 % IV SOLN: 250.0000 mL | INTRAVENOUS | Status: DC | PRN

## 2016-03-14 MED ORDER — HEPARIN (PORCINE) IN NACL 2-0.9 UNIT/ML-% IJ SOLN
INTRAMUSCULAR | Status: AC
  Filled 2016-03-14: qty 1000

## 2016-03-14 MED ORDER — FENTANYL CITRATE (PF) 100 MCG/2ML IJ SOLN
INTRAMUSCULAR | Status: DC | PRN
Start: 2016-03-14 — End: 2016-03-14
  Administered 2016-03-14 (×3): 25 ug via INTRAVENOUS

## 2016-03-14 MED ORDER — LIDOCAINE HCL (PF) 1 % IJ SOLN
INTRAMUSCULAR | Status: DC | PRN
  Administered 2016-03-14: 20 mL

## 2016-03-14 MED ORDER — BIVALIRUDIN 250 MG IV SOLR
INTRAVENOUS | Status: AC
  Filled 2016-03-14: qty 250

## 2016-03-14 MED ORDER — HEPARIN (PORCINE) IN NACL 2-0.9 UNIT/ML-% IJ SOLN
INTRAMUSCULAR | Status: DC | PRN
  Administered 2016-03-14: 1000 mL

## 2016-03-14 MED ORDER — MIDAZOLAM HCL 2 MG/2ML IJ SOLN
INTRAMUSCULAR | Status: DC | PRN
  Administered 2016-03-14: 1 mg via INTRAVENOUS
  Administered 2016-03-14: 2 mg via INTRAVENOUS

## 2016-03-14 MED ORDER — VERAPAMIL HCL 2.5 MG/ML IV SOLN
INTRAVENOUS | Status: AC
  Filled 2016-03-14: qty 2

## 2016-03-14 MED FILL — Nitroglycerin IV Soln 200 MCG/ML in D5W: INTRAVENOUS | Qty: 250 | Status: AC

## 2016-03-14 MED FILL — Heparin Sodium (Porcine) 100 Unt/ML in Sodium Chloride 0.45%: INTRAMUSCULAR | Qty: 250 | Status: AC

## 2016-03-14 SURGICAL SUPPLY — 23 items
BALLN EUPHORA RX 2.25X12 (BALLOONS) ×2
BALLN ~~LOC~~ EUPHORA RX 2.5X12 (BALLOONS)
BALLN ~~LOC~~ MOZEC 2.5X13 (BALLOONS) ×2
BALLOON EUPHORA RX 2.25X12 (BALLOONS) ×1 IMPLANT
BALLOON ~~LOC~~ EUPHORA RX 2.5X12 (BALLOONS) IMPLANT
BALLOON ~~LOC~~ MOZEC 2.5X13 (BALLOONS) ×1 IMPLANT
COVER PRB 48X5XTLSCP FOLD TPE (BAG) ×2 IMPLANT
COVER PROBE 5X48 (BAG) ×2
GLIDESHEATH SLEND SS 6F .021 (SHEATH) ×2 IMPLANT
GUIDE CATH RUNWAY 6FR AL 75 (CATHETERS) ×2 IMPLANT
GUIDE CATH RUNWAY 6FR FR4 SH (CATHETERS) ×2 IMPLANT
GUIDEWIRE PRESSURE COMET II (WIRE) ×2 IMPLANT
KIT ENCORE 26 ADVANTAGE (KITS) ×4 IMPLANT
KIT HEART LEFT (KITS) ×2 IMPLANT
PACK CARDIAC CATHETERIZATION (CUSTOM PROCEDURE TRAY) ×2 IMPLANT
SHEATH PINNACLE 6F 10CM (SHEATH) ×2 IMPLANT
STENT PROMUS PREM MR 2.25X16 (Permanent Stent) ×2 IMPLANT
STENT PROMUS PREM MR 2.25X8 (Permanent Stent) ×2 IMPLANT
TRANSDUCER W/STOPCOCK (MISCELLANEOUS) ×2 IMPLANT
TUBING CIL FLEX 10 FLL-RA (TUBING) ×2 IMPLANT
WIRE HI TORQ VERSACORE-J 145CM (WIRE) ×4 IMPLANT
WIRE J 3MM .035X260CM (WIRE) ×2 IMPLANT
WIRE MARVEL STR TIP 190CM (WIRE) ×2 IMPLANT

## 2016-03-14 NOTE — H&P (View-Only) (Signed)
Patient Name: Erin [name] Date of Encounter: 03/13/2016  Primary Cardiologist: Cleotilde Neer, NP  @ [facility] Problem List     Principal Problem:   NSTEMI (non-ST elevated myocardial infarction) [facility]) Active Problems:   Benign essential HTN   Hyperlipidemia   Tobacco abuse     Subjective   Had some mild chest burning despite IV nitroglycerin and IV heparin. Feeling a little bit better this morning. No shortness of breath.  Inpatient Medications    Scheduled Meds: . aspirin  81 mg Oral Daily  . atorvastatin  80 mg Oral q1800  . levofloxacin (LEVAQUIN) IV  750 mg Intravenous Q24H  . lisinopril  5 mg Oral Daily  . metoprolol tartrate  25 mg Oral BID  . potassium chloride  40 mEq Oral TID  . sodium chloride flush  3 mL Intravenous Q12H  . ticagrelor  90 mg Oral BID   Continuous Infusions: . heparin 800 Units/hr (03/13/16 0700)  . nitroGLYCERIN     PRN Meds: sodium chloride, acetaminophen, calcium carbonate, LORazepam, morphine injection, nitroGLYCERIN, ondansetron (ZOFRAN) IV, sodium chloride flush   Vital Signs    Vitals:   03/12/16 2130 03/12/16 2157 03/13/16 0457 03/13/16 0742  BP: 119/77  101/73   Pulse: 96 100 (!) 102   Resp: (!) 23  (!) 27 (!) 22  Temp: 98.3 F (36.8 C)  98.4 F (36.9 C) 97.9 F (36.6 C)  TempSrc: Oral  Oral Oral  SpO2: 99%  98%   Weight:   115 lb (52.2 kg)     Intake/Output Summary (Last 24 hours) at 03/13/16 0944 Last data filed at 03/12/16 2159  Gross per 24 hour  Intake              363 ml  Output              300 ml  Net               63 ml   Filed Weights   03/11/16 2049 03/12/16 0500 03/13/16 0457  Weight: 121 lb 11.2 oz (55.2 kg) 120 lb (54.4 kg) 115 lb (52.2 kg)    Physical Exam    GEN: Thin, in no acute distress.  HEENT: Grossly normal.  Neck: Supple, no JVD, carotid bruits, or masses. Cardiac: RRR, Soft apical systolic murmur,no rubs, or gallops. No clubbing, cyanosis, edema.   Radials/DP/PT 2+ and equal bilaterally.  Respiratory:  Respirations regular and unlabored, mild wheeze heard bilaterally  GI: Soft, nontender, nondistended, BS + x 4. MS: no deformity or atrophy. Skin: warm and dry, no rash. Neuro:  Strength and sensation are intact. Psych: AAOx3.  Normal affect.  Labs    CBC  Recent Labs  03/12/16 0536 03/13/16 0336  WBC 14.0* 14.7*  HGB 11.8* 12.6  HCT 35.6* 37.1  MCV 85.8 83.7  PLT 325 0000000   Basic Metabolic Panel  Recent Labs  03/12/16 0536 03/13/16 0336  NA 135 138  K 3.5 2.8*  CL 101 100*  CO2 25 28  GLUCOSE 113* 114*  BUN 12 9  CREATININE 0.88 0.77  CALCIUM 8.9 8.9   Cardiac Enzymes  Recent Labs  03/12/16 0017 03/12/16 0536 03/12/16 1258  TROPONINI >65.00* >65.00* >65.00*    Telemetry    Sinus rhythm/mild sinus tachycardia no adverse arrhythmias - Personally Reviewed  ECG     Sinus rhythm, lateral infarct, mild ST segment depression laterally precordial leads- Personally Reviewed  Radiology    Dg Chest  Port 1 View  Result Date: 03/12/2016 CLINICAL DATA:  Pt having SOB, no cp. Hx of hypertension. Cardiac cath 03/11/16. Smoker for 11 years. EXAM: PORTABLE CHEST 1 VIEW COMPARISON:  03/11/2016 FINDINGS: Opacity has developed at the right lung base since the prior study. This is likely atelectasis. Consider pneumonia if there are consistent clinical symptoms. Remainder of the lungs is clear. No convincing pleural effusion. No pneumothorax. Heart, mediastinum and hila are unremarkable. IMPRESSION: 1. New right lung base opacity most likely atelectasis. Pneumonia is possible. Electronically Signed   By: Lajean Manes M.D.   On: 03/12/2016 09:19    Cardiac Studies   Cath 03/11/16:  Prox-Mid Cx lesion, 100 %stenosed.  A STENT SYNERGY DES 3X12 drug eluting stent was successfully placed, and overlaps previously placed stent.  Post intervention, there is a 0% residual stenosis.  Dist RCA lesion, 80 %stenosed. - Would  consider treatment in a staged fashion, could either be done during this hospitalization or as an outpatient.  The left ventricular ejection fraction is 45-50% by visual estimate.  There is severe (4+) mitral regurgitation.  LV end diastolic pressure is severely elevated.    Pathophysiologically, this is equivalent to a non-ST elevation MI, however with no EKG changes, is technically non-STEMI.  She will be admitted to [facility] post procedure unit/stepdown. Was chest pain-free upon completion of the procedure.  Plan:  TR band removal per protocol  Continue Angiomax until current bag completed  Nitroglycerin infusion weaned from 40 g/min down to 20. Would continue to wean overnight.  1dose IV Lasix 40 mg for elevated LVEDP and MR  Order home dose metoprolol for this evening, and start ACE inhibitor tomorrow.  Dual antiplatelet therapy for minimum of 3 months, could potentially consider stopping aspirin after 3 months  We'll need 2-D echocardiogram to clarify extent of MR  Based on the existing lesion, and MR, would not fast-track discharge   Glenetta Hew, M.D., M.S.  Patient Profile     55 year old with non-ST elevation MI with plans for staged PCI on Monday.  Assessment & Plan    Non-STEMI  - She was taken directly to the Cath Lab and this showed a 100% mid circumflex lesion status post DES. In reviewing the Cath Lab report, this overlaps a previously placed stent however she has no history of prior stent placement? Perhaps this was calcification?  - Troponin greater than 65 (high risk)  - Heparin reinitiated  - Plan is for staged PCI to RCA on Monday. Cath orders will be placed.  - Currently on dual antiplatelet therapy.  Mitral regurgitation  - Cardiac catheterization mentions 4+, question ischemic MR. I don't hear much of a murmur on exam.  - Current echocardiogram pending.  - Prior echocardiogram demonstrated EF of 60% with mild mitral regurgitation. This was  on 01/01/16.  Community-acquired pneumonia   - Dr. Harl Bowie checked an x-ray and started her on levofloxacin for right lung opacity  Hypokalemia  - Potassium 2.8 repleting  Essential hypertension  - Lisinopril, metoprolol  Hyperlipidemia  - Statin therapy  Tobacco cessation discussed  Signed, Candee Furbish, MD  03/13/2016, 9:44 AM

## 2016-03-14 NOTE — Interval H&P Note (Signed)
History and Physical Interval Note:  03/14/2016 11:18 AM  [name]  has presented today for surgery, with the diagnosis of CAD  The various methods of treatment have been discussed with the patient and family. After consideration of risks, benefits and other options for treatment, the patient has consented to  Procedure(s): Coronary Stent Intervention (N/A) as a surgical intervention .  The patient's history has been reviewed, patient examined, no change in status, stable for surgery.  I have reviewed the patient's chart and labs.  Questions were answered to the patient's satisfaction.    Cath Lab Visit (complete for each Cath Lab visit)  Clinical Evaluation Leading to the Procedure:   ACS: Yes.    Non-ACS:    Anginal Classification: CCS II  Anti-ischemic medical therapy: Minimal Therapy (1 class of medications)  Non-Invasive Test Results: No non-invasive testing performed  Prior CABG: No previous CABG       Collier Salina [facility] 03/14/2016 11:18 AM

## 2016-03-14 NOTE — Progress Notes (Signed)
ANTICOAGULATION CONSULT NOTE - Follow Up Consult  Pharmacy Consult for heparin Indication: CAD  Labs:  Recent Labs  03/11/16 1649  03/12/16 0017 03/12/16 0536 03/12/16 1258  03/13/16 0336 03/13/16 1106 03/13/16 1742 03/14/16 0506  HGB 12.2  < >  --  11.8*  --   --  12.6  --   --  13.5  HCT 36.0  < >  --  35.6*  --   --  37.1  --   --  39.9  PLT  --   < >  --  325  --   --  349  --   --  366  HEPARINUNFRC  --   --   --   --   --   < > 0.22* 0.26* 0.12* 0.50  CREATININE 0.60  --   --  0.88  --   --  0.77  --   --   --   TROPONINI  --   < > >65.00* >65.00* >65.00*  --   --   --   --   --   < > = values in this interval not displayed.   Assessment/Plan:  55yo female therapeutic on heparin after rate changes. Will continue gtt at current rate and confirm stable with additional level.   Wynona Neat, PharmD, BCPS  03/14/2016,5:56 AM

## 2016-03-14 NOTE — Care Management Note (Addendum)
Case Management Note  Patient Details  Name: Erin [name] MRN: [id number] Date of Birth: 1960-10-04  Subjective/Objective:     S/p coronary stent, NSTEMI, patient lives with her daughter , pta indep, she has pcp at [facility] , she goes to [facility] in [place], she will have transport at [facility] and she has medication coverage. NCM waiting to see what anticoagulant patient will be on to do benefit check, she has had brilinta will check brilinta.  NCM gave patient the 30 day savings card for brilinta.  She goes to [facility] in [place] and they do have brilinta in stock.    NCM will cont to follow for dc needs.   Patient will participate in twilight study.             Action/Plan:   Expected Discharge Date:                  Expected Discharge Plan:  Home/Self Care  In-House Referral:     Discharge planning Services  CM Consult  Post Acute Care Choice:    Choice offered to:     DME Arranged:    DME Agency:     HH Arranged:    HH Agency:     Status of Service:  Completed, signed off  If discussed at [employer] of Stay Meetings, dates discussed:    Additional Comments:  Zenon Mayo, RN 03/14/2016, 2:20 PM

## 2016-03-14 NOTE — Progress Notes (Signed)
Site area: right groin  Site Prior to Removal:  Level 0  Pressure Applied For 20 MINUTES    Minutes Beginning at 1545  Manual:   Yes.    Patient Status During Pull:  stable  Post Pull Groin Site:  Level 0  Post Pull Instructions Given:  Yes.    Post Pull Pulses Present:  Yes.    Dressing Applied:  Yes.    Comments:  Rechecked frequently with no change in assessment

## 2016-03-14 NOTE — Progress Notes (Signed)
CARDIAC REHAB PHASE I   PRE:  Rate/Rhythm: 97 SR  BP:  Sitting: 101/69        SaO2: 97 RA  MODE:  Ambulation: 175 ft   POST:  Rate/Rhythm: 136 ST  BP:  Sitting: 95/66         SaO2: 99 RA  Pt ambulated 175 ft on RA, IV, assist x1, slow, fairly steady gait, tolerated fair. Pt HR elevated 130s with ambulation, pt c/o dizziness, some mild DOE, requested to return to room. Pt HR decreased to 95 SR after 2 minutes rest, dizziness improved with rest. Pt denies any chest pain. Pt surrounded by family, awaiting PCI, will follow up tomorrow for education, additional ambulation. Pt to bed per pt request after walk, call bell within reach. Will follow.   [id number] Lenna Sciara, RN, BSN 03/14/2016 10:59 AM

## 2016-03-14 NOTE — Progress Notes (Signed)
The patient is for staged PCI today.  Daryel November, MD

## 2016-03-15 ENCOUNTER — Other Ambulatory Visit: Payer: Self-pay | Admitting: *Deleted

## 2016-03-15 ENCOUNTER — Encounter (HOSPITAL_COMMUNITY): Payer: Self-pay | Admitting: Cardiology

## 2016-03-15 LAB — CBC
HCT: 35.2 % — ABNORMAL LOW (ref 36.0–46.0)
Hemoglobin: 11.7 g/dL — ABNORMAL LOW (ref 12.0–15.0)
MCH: 28.1 pg (ref 26.0–34.0)
MCHC: 33.2 g/dL (ref 30.0–36.0)
MCV: 84.4 fL (ref 78.0–100.0)
Platelets: 361 10*3/uL (ref 150–400)
RBC: 4.17 MIL/uL (ref 3.87–5.11)
RDW: 14.6 % (ref 11.5–15.5)
WBC: 12.2 10*3/uL — ABNORMAL HIGH (ref 4.0–10.5)

## 2016-03-15 LAB — BASIC METABOLIC PANEL
Anion gap: 8 (ref 5–15)
BUN: 11 mg/dL (ref 6–20)
CO2: 19 mmol/L — ABNORMAL LOW (ref 22–32)
Calcium: 9 mg/dL (ref 8.9–10.3)
Chloride: 108 mmol/L (ref 101–111)
Creatinine, Ser: 0.87 mg/dL (ref 0.44–1.00)
GFR calc Af Amer: >60 mL/min (ref >60)
GFR calc non Af Amer: >60 mL/min (ref >60)
Glucose, Bld: 99 mg/dL (ref 65–99)
Potassium: 3.9 mmol/L (ref 3.5–5.1)
Sodium: 135 mmol/L (ref 135–145)

## 2016-03-15 MED ORDER — ACETAMINOPHEN 500 MG PO TABS: 500.0000 mg | ORAL_TABLET | Freq: Three times a day (TID) | ORAL | 0 refills | Status: DC | PRN

## 2016-03-15 MED ORDER — ATORVASTATIN CALCIUM 80 MG PO TABS: 80.0000 mg | ORAL_TABLET | Freq: Every day | ORAL | 6 refills | Status: DC

## 2016-03-15 MED ORDER — TICAGRELOR 90 MG PO TABS: 90.0000 mg | ORAL_TABLET | Freq: Two times a day (BID) | ORAL | Status: DC

## 2016-03-15 MED ORDER — LEVOFLOXACIN 500 MG PO TABS: 500.0000 mg | ORAL_TABLET | Freq: Every day | ORAL | 0 refills | Status: DC

## 2016-03-15 MED ORDER — LISINOPRIL 5 MG PO TABS: 5.0000 mg | ORAL_TABLET | Freq: Every day | ORAL | 6 refills | Status: DC

## 2016-03-15 MED ORDER — AMBULATORY NON FORMULARY MEDICATION: 90.0000 mg | Freq: Two times a day (BID) | Status: DC

## 2016-03-15 MED ORDER — AMBULATORY NON FORMULARY MEDICATION: 81.0000 mg | Freq: Every day | Status: DC

## 2016-03-15 NOTE — Discharge Summary (Signed)
Discharge Summary    Patient ID: Erin [name],  MRN: [id number], DOB/AGE: 1960-06-26 55 y.o.  Admit date: 03/11/2016 Discharge date: 03/15/2016  Primary Care Provider: Maurice Small D Primary Cardiologist: Dr. Olevia Perches, NP Linna Hoff)   Discharge Diagnoses    Principal Problem:   NSTEMI (non-ST elevated myocardial infarction) [facility]) Active Problems:   Benign essential HTN   Hyperlipidemia   Tobacco abuse   Allergies No Known Allergies  Diagnostic Studies/Procedures    LHC: 03/11/16  Conclusion     Prox-Mid Cx lesion, 100 %stenosed.  A STENT SYNERGY DES 3X12 drug eluting stent was successfully placed, and overlaps previously placed stent.  Post intervention, there is a 0% residual stenosis.  Dist RCA lesion, 80 %stenosed. - Would consider treatment in a staged fashion, could either be done during this hospitalization or as an outpatient.  The left ventricular ejection fraction is 45-50% by visual estimate.  There is severe (4+) mitral regurgitation.  LV end diastolic pressure is severely elevated.    Pathophysiologically, this is equivalent to a non-ST elevation MI, however with no EKG changes, is technically non-STEMI.  She will be admitted to [facility] post procedure unit/stepdown. Was chest pain-free upon completion of the procedure.  Plan:  TR band removal per protocol  Continue Angiomax until current bag completed  Nitroglycerin infusion weaned from 40 g/min down to 20. Would continue to wean overnight.  1dose IV Lasix 40 mg for elevated LVEDP and MR  Order home dose metoprolol for this evening, and start ACE inhibitor tomorrow.  Dual antiplatelet therapy for minimum of 3 months, could potentially consider stopping aspirin after 3 months  We'll need 2-D echocardiogram to clarify extent of MR   03/14/16  Conclusion     Mid Cx lesion, 0 %stenosed.  A drug eluting .  A STENT PROMUS PREM MR 2.25X16 drug eluting stent was successfully  placed.  A STENT PROMUS PREM MR 2.25X8 drug-eluting stent was successfully placed.  Dist RCA lesion, 60 %stenosed.  Post intervention, there is a 0% residual stenosis.  Post Atrio lesion, 0 %stenosed.  Post intervention, there is a 0% residual stenosis.  A stent was successfully placed.   1. Successful Stenting of the distal RCA/PDA with DES x 2.  Plan: DAPT for one year. Risk factor modification.   _____________   History of Present Illness     Erin [name] is a 55 year old female with a past medical history of HTN and tobacco abuse. She was asleep in her bed when she developed acute onset chest pain that she describes as substernal heaviness. Pain was associated with nausea and diaphoresis. She took an ASA and one SL Nitro which helped her pain and she went back to sleep. Around 2am, she was awakened again with chest pain this time it was more severe, but still had the same characteristics of chest heaviness with associated nausea and diaphoresis. She took another [employer] and called EMS.  She was taken to [facility] and her EKG was reportedly normal. First troponin was negative. Subsequent troponin was 0.30. She was transferred to [facility] for heart catheterization. She continues to have chest pain and feels nauseous at the time of admission.  Erin [name] is a current every day smoker, she occasionally drinks beer. She was seen at [facility] in April of this year after reporting chest pain and neck pain. She underwent a nuclear stress test which showed a small reversible defect in the anterior wall, the images were  reviewed by a Cardiologist and no ischemia was noted on the images. She was discharged to home, follow up Echo showed normal EF with no wall motion abnormalities.    Hospital Course     Consultants: None  She was taken for [facility] with Dr. Ellyn Hack on 11/3 which noted 100% pro-mid Cx treated with PTCA and DES. Cath report noted this overlaps previously placed stent however  no prior hx of stent placement. She also have 80% RCA stenosed. Her troponin > 65 x 3. IV heparin was restarted and plan was made to return for staged PCI. Low dose ACEi was started given her rising blood pressures.   She developed an uptrend in her WBC, so CXR was obtained noting possible infiltrate suggestive of CAP. She was started on levaquin 750mg  IV daily and then transitioned to PO 500mg .   She returned for staged PCI to the RCA/PDA with DES x2 on 11/6. Plan is to continue with DAPT for a year. Discussion was had with Dr. Martinique and Research team, and patient chose to participate in the Twilight study. Her follow up echo this admission did show newly reduced EF of 30-35%, and G2DD with mild MR.   She was seen and assessed by Dr. Ron Parker and noted stable for discharge on 11/7. She was given her bottles of ASA and Brilinta prior to discharge. I have arranged for follow up in the office. She was also given a note for work.   Physical Exam:   GEN: Thin, in no acute distress.  HEENT: Grossly normal.  Neck: Supple, no JVD, carotid bruits, or masses. Cardiac: RRR, Soft apical systolic murmur,no rubs, or gallops. No clubbing, cyanosis, edema.  Radials/DP/PT 2+ and equal bilaterally.  Respiratory:  Respirations regular and unlabored, mild wheeze heard bilaterally  GI: Soft, nontender, nondistended, BS + x 4. MS: no deformity or atrophy. Skin: warm and dry, no rash. Right radial site/right femoral site stable with mild bruising.  Neuro:  Strength and sensation are intact. Psych: AAOx3.  Normal affect. _____________  Discharge Vitals Blood pressure 98/64, pulse 94, temperature 98 F (36.7 C), temperature source Oral, resp. rate 16, height 5' (1.524 m), weight 112 lb 7 oz (51 kg), SpO2 97 %.  Filed Weights   03/13/16 0457 03/14/16 0523 03/15/16 0600  Weight: 115 lb (52.2 kg) 112 lb 8 oz (51 kg) 112 lb 7 oz (51 kg)    Labs & Radiologic Studies    CBC  Recent Labs  03/14/16 0506  03/15/16 0509  WBC 14.3* 12.2*  HGB 13.5 11.7*  HCT 39.9 35.2*  MCV 84.5 84.4  PLT 366 [id number]   Basic Metabolic Panel  Recent Labs  03/13/16 1106 03/14/16 0506 03/15/16 0509  NA  --  136 135  K  --  3.8 3.9  CL  --  105 108  CO2  --  21* 19*  GLUCOSE  --  102* 99  BUN  --  8 11  CREATININE  --  0.86 0.87  CALCIUM  --  9.4 9.0  MG 2.1  --   --    Liver Function Tests No results for input(s): AST, ALT, ALKPHOS, BILITOT, PROT, ALBUMIN in the last 72 hours. No results for input(s): LIPASE, AMYLASE in the last 72 hours. Cardiac Enzymes  Recent Labs  03/12/16 1258  TROPONINI >65.00*   BNP Invalid input(s): POCBNP D-Dimer No results for input(s): DDIMER in the last 72 hours. Hemoglobin A1C No results for input(s): HGBA1C in the last 72  hours. Fasting Lipid Panel No results for input(s): CHOL, HDL, LDLCALC, TRIG, CHOLHDL, LDLDIRECT in the last 72 hours. Thyroid Function Tests No results for input(s): TSH, T4TOTAL, T3FREE, THYROIDAB in the last 72 hours.  Invalid input(s): FREET3 _____________  Dg Chest Port 1 View  Result Date: 03/12/2016 CLINICAL DATA:  Pt having SOB, no cp. Hx of hypertension. Cardiac cath 03/11/16. Smoker for 11 years. EXAM: PORTABLE CHEST 1 VIEW COMPARISON:  03/11/2016 FINDINGS: Opacity has developed at the right lung base since the prior study. This is likely atelectasis. Consider pneumonia if there are consistent clinical symptoms. Remainder of the lungs is clear. No convincing pleural effusion. No pneumothorax. Heart, mediastinum and hila are unremarkable. IMPRESSION: 1. New right lung base opacity most likely atelectasis. Pneumonia is possible. Electronically Signed   By: Lajean Manes M.D.   On: 03/12/2016 09:19   Disposition   Pt is being discharged home today in good condition.  Follow-up Plans & Appointments    Follow-up Information    Jory Sims, NP Follow up on 03/28/2016.   Specialties:  Nurse Practitioner, Radiology,  Cardiology Why:  at 2:45pm for your hospital follow up.  Contact information: [place] [address] [phone]          Discharge Instructions    AMB Referral to Cardiac Rehabilitation - Phase II    Complete by:  As directed    Diagnosis:  NSTEMI   Call MD for:  redness, tenderness, or signs of infection (pain, swelling, redness, odor or green/yellow discharge around incision site)    Complete by:  As directed    Diet - low sodium heart healthy    Complete by:  As directed    Discharge instructions    Complete by:  As directed    Radial Site Care Refer to this sheet in the next few weeks. These instructions provide you with information on caring for yourself after your procedure. Your caregiver may also give you more specific instructions. Your treatment has been planned according to current medical practices, but problems sometimes occur. Call your caregiver if you have any problems or questions after your procedure. HOME CARE INSTRUCTIONS You may shower the day after the procedure.Remove the bandage (dressing) and gently wash the site with plain soap and water.Gently pat the site dry.  Do not apply powder or lotion to the site.  Do not submerge the affected site in water for 3 to 5 days.  Inspect the site at least twice daily.  Do not flex or bend the affected arm for 24 hours.  No lifting over 5 pounds (2.3 kg) for 5 days after your procedure.  Do not drive home if you are discharged the same day of the procedure. Have someone else drive you.  You may drive 24 hours after the procedure unless otherwise instructed by your caregiver.  What to expect: Any bruising will usually fade within 1 to 2 weeks.  Blood that collects in the tissue (hematoma) may be painful to the touch. It should usually decrease in size and tenderness within 1 to 2 weeks.  SEEK IMMEDIATE MEDICAL CARE IF: You have unusual pain at the radial site.  You have redness, warmth, swelling, or pain  at the radial site.  You have drainage (other than a small amount of blood on the dressing).  You have chills.  You have a fever or persistent symptoms for more than 72 hours.  You have a fever and your symptoms suddenly get worse.  Your arm becomes pale, cool, tingly, or numb.  You have heavy bleeding from the site. Hold pressure on the site.   Groin Site Care Refer to this sheet in the next few weeks. These instructions provide you with information on caring for yourself after your procedure. Your caregiver may also give you more specific instructions. Your treatment has been planned according to current medical practices, but problems sometimes occur. Call your caregiver if you have any problems or questions after your procedure. HOME CARE INSTRUCTIONS You may shower 24 hours after the procedure. Remove the bandage (dressing) and gently wash the site with plain soap and water. Gently pat the site dry.  Do not apply powder or lotion to the site.  Do not sit in a bathtub, swimming pool, or whirlpool for 5 to 7 days.  No bending, squatting, or lifting anything over 10 pounds (4.5 kg) as directed by your caregiver.  Inspect the site at least twice daily.  Do not drive home if you are discharged the same day of the procedure. Have someone else drive you.  You may drive 24 hours after the procedure unless otherwise instructed by your caregiver.  What to expect: Any bruising will usually fade within 1 to 2 weeks.  Blood that collects in the tissue (hematoma) may be painful to the touch. It should usually decrease in size and tenderness within 1 to 2 weeks.  SEEK IMMEDIATE MEDICAL CARE IF: You have unusual pain at the groin site or down the affected leg.  You have redness, warmth, swelling, or pain at the groin site.  You have drainage (other than a small amount of blood on the dressing).  You have chills.  You have a fever or persistent symptoms for more than 72 hours.  You have a fever and  your symptoms suddenly get worse.  Your leg becomes pale, cool, tingly, or numb.  You have heavy bleeding from the site. Hold pressure on the site. .   Increase activity slowly    Complete by:  As directed       Discharge Medications   Current Discharge Medication List    START taking these medications   Details  atorvastatin (LIPITOR) 80 MG tablet Take 1 tablet (80 mg total) by mouth daily at 6 PM. Qty: 30 tablet, Refills: 6    levofloxacin (LEVAQUIN) 500 MG tablet Take 1 tablet (500 mg total) by mouth daily. Qty: 3 tablet, Refills: 0    lisinopril (PRINIVIL,ZESTRIL) 5 MG tablet Take 1 tablet (5 mg total) by mouth daily. Qty: 30 tablet, Refills: 6    ticagrelor (BRILINTA) 90 MG TABS tablet Take 1 tablet (90 mg total) by mouth 2 (two) times daily. Qty: 60 tablet      CONTINUE these medications which have CHANGED   Details  acetaminophen (TYLENOL) 500 MG tablet Take 1 tablet (500 mg total) by mouth every 8 (eight) hours as needed for mild pain, moderate pain or headache. Qty: 30 tablet, Refills: 0      CONTINUE these medications which have NOT CHANGED   Details  aspirin EC 81 MG tablet Take 81 mg by mouth daily.    metoprolol tartrate (LOPRESSOR) 25 MG tablet Take 1 tablet (25 mg total) by mouth 2 (two) times daily. Qty: 60 tablet, Refills: 11    nitroGLYCERIN (NITROSTAT) 0.4 MG SL tablet Place 1 tablet (0.4 mg total) under the tongue every 5 (five) minutes x 3 doses as needed for chest pain. Qty: 25 tablet, Refills: 3  STOP taking these medications     lovastatin (MEVACOR) 20 MG tablet      lisinopril-hydrochlorothiazide (ZESTORETIC) 10-12.5 MG tablet      calcium carbonate (TUMS EX) 750 MG chewable tablet          Aspirin prescribed at discharge?  Yes High Intensity Statin Prescribed? (Lipitor 40-80mg  or Crestor 20-40mg ): Yes Beta Blocker Prescribed? Yes For EF <40%, was ACEI/ARB Prescribed? Yes ADP Receptor Inhibitor Prescribed? (i.e. Plavix  etc.-Includes Medically Managed Patients): Yes For EF <40%, Aldosterone Inhibitor Prescribed? No: Soft Blood pressure Was EF assessed during THIS hospitalization? Yes Was Cardiac Rehab II ordered? (Included Medically managed Patients): Yes   Outstanding Labs/Studies   FLP and LFTs if about to tolerate statin. Likely need follow up echo in 6 weeks to reassess LV function.   Duration of Discharge Encounter   Greater than 30 minutes including physician time.  Signed, Reino Bellis NP-C  03/15/2016, 8:34 AM  Patient seen and examined. I agree with the assessment and plan as detailed above. See also my additional thoughts below.   The patient is stable and ready for discharge home. I agree with the note and plans as outlined above. I made the decision for discharge. All post hospital arrangements have been made.  Dola Argyle, MD, [facility] 03/15/2016 9:41 AM

## 2016-03-15 NOTE — Progress Notes (Signed)
CARDIAC REHAB PHASE I   PRE:  Rate/Rhythm: 93 SR  BP:  Sitting: 105/60        SaO2: 98 RA  MODE:  Ambulation: 200 ft   POST:  Rate/Rhythm: 118 ST  BP:  Sitting: 106/79         SaO2: 97 RA  Pt ambulated 200 ft on RA, handheld assist, gait belt, slow, mostly steady gait, tolerated fairly well.  Pt c/o mild dizziness improved with distance, R groin soreness, general fatigue, denies any other complaints, declined rest stop. Completed MI/stent education.  Reviewed risk factors, tobacco cessation (gave pt fake cigarette), MI book, anti-platelet therapy, stent card, activity restrictions, ntg, exercise, heart healthy diet, and phase 2 cardiac rehab. Pt verbalized understanding, receptive to education, states she is overwhelmed but wants to quit smoking. Pt agrees to phase 2 cardiac rehab referral, will send to [facility] per pt request. Pt to recliner after walk, call bell within reach.   [id number] Lenna Sciara, RN, BSN 03/15/2016 9:57 AM

## 2016-03-15 NOTE — Research (Signed)
TWILIGHT Informed Consent   Subject Name: Erin [name]  Subject met inclusion and exclusion criteria.  The informed consent form, study requirements and expectations were reviewed with the subject and questions and concerns were addressed prior to the signing of the consent form.  The subject verbalized understanding of the trail requirements.  The subject agreed to participate in the TWILIGHT trial and signed the informed consent.  The informed consent was obtained prior to performance of any protocol-specific procedures for the subject.  A copy of the signed informed consent was given to the subject and a copy was placed in the subject's medical record.  Mable Fill, Marissa Nestle 03/15/2016, 7:22am

## 2016-03-15 NOTE — Progress Notes (Addendum)
Pt copay will be 100% of the drug cost. Pt has not met her deductible of $125, once she has met her deductible pt copay will be 20% of the drug cost   Patient will participate in twilight for brilinta.

## 2016-03-16 MED FILL — Heparin Sodium (Porcine) Inj 1000 Unit/ML: INTRAMUSCULAR | Qty: 10 | Status: AC

## 2016-03-16 MED FILL — Verapamil HCl IV Soln 2.5 MG/ML: INTRAVENOUS | Qty: 2 | Status: AC

## 2016-03-17 ENCOUNTER — Other Ambulatory Visit: Payer: Self-pay

## 2016-03-17 ENCOUNTER — Telehealth: Payer: Self-pay | Admitting: Adult Health

## 2016-03-17 ENCOUNTER — Observation Stay (HOSPITAL_COMMUNITY)
Admission: EM | Admit: 2016-03-17 | Discharge: 2016-03-18 | Disposition: A | Discharge status: Home / Self Care | Payer: PRIVATE HEALTH INSURANCE | Attending: Internal Medicine | Admitting: Internal Medicine

## 2016-03-17 ENCOUNTER — Encounter (HOSPITAL_COMMUNITY): Payer: Self-pay

## 2016-03-17 ENCOUNTER — Emergency Department (HOSPITAL_COMMUNITY): Payer: PRIVATE HEALTH INSURANCE

## 2016-03-17 DIAGNOSIS — E782 Mixed hyperlipidemia: Secondary | ICD-10-CM | POA: Diagnosis present

## 2016-03-17 DIAGNOSIS — R197 Diarrhea, unspecified: Secondary | ICD-10-CM | POA: Insufficient documentation

## 2016-03-17 DIAGNOSIS — I214 Non-ST elevation (NSTEMI) myocardial infarction: Secondary | ICD-10-CM | POA: Diagnosis present

## 2016-03-17 DIAGNOSIS — I959 Hypotension, unspecified: Secondary | ICD-10-CM | POA: Diagnosis not present

## 2016-03-17 DIAGNOSIS — R42 Dizziness and giddiness: Secondary | ICD-10-CM | POA: Diagnosis not present

## 2016-03-17 DIAGNOSIS — R1110 Vomiting, unspecified: Secondary | ICD-10-CM | POA: Diagnosis not present

## 2016-03-17 DIAGNOSIS — I2510 Atherosclerotic heart disease of native coronary artery without angina pectoris: Secondary | ICD-10-CM | POA: Insufficient documentation

## 2016-03-17 DIAGNOSIS — E785 Hyperlipidemia, unspecified: Secondary | ICD-10-CM | POA: Diagnosis present

## 2016-03-17 DIAGNOSIS — I952 Hypotension due to drugs: Secondary | ICD-10-CM | POA: Diagnosis not present

## 2016-03-17 DIAGNOSIS — E7800 Pure hypercholesterolemia, unspecified: Secondary | ICD-10-CM

## 2016-03-17 DIAGNOSIS — F17210 Nicotine dependence, cigarettes, uncomplicated: Secondary | ICD-10-CM | POA: Insufficient documentation

## 2016-03-17 DIAGNOSIS — K219 Gastro-esophageal reflux disease without esophagitis: Secondary | ICD-10-CM | POA: Diagnosis present

## 2016-03-17 DIAGNOSIS — Z720 Tobacco use: Secondary | ICD-10-CM | POA: Diagnosis present

## 2016-03-17 DIAGNOSIS — R55 Syncope and collapse: Secondary | ICD-10-CM

## 2016-03-17 DIAGNOSIS — Z79899 Other long term (current) drug therapy: Secondary | ICD-10-CM | POA: Insufficient documentation

## 2016-03-17 LAB — CBC WITH DIFFERENTIAL/PLATELET
Basophils Absolute: 0 10*3/uL (ref 0.0–0.1)
Basophils Relative: 0 %
Eosinophils Absolute: 0.1 10*3/uL (ref 0.0–0.7)
Eosinophils Relative: 1 %
HCT: 32.3 % — ABNORMAL LOW (ref 36.0–46.0)
Hemoglobin: 11 g/dL — ABNORMAL LOW (ref 12.0–15.0)
Lymphocytes Relative: 13 %
Lymphs Abs: 1.6 10*3/uL (ref 0.7–4.0)
MCH: 28.6 pg (ref 26.0–34.0)
MCHC: 34.1 g/dL (ref 30.0–36.0)
MCV: 84.1 fL (ref 78.0–100.0)
Monocytes Absolute: 1.7 10*3/uL — ABNORMAL HIGH (ref 0.1–1.0)
Monocytes Relative: 14 %
Neutro Abs: 9 10*3/uL — ABNORMAL HIGH (ref 1.7–7.7)
Neutrophils Relative %: 72 %
Platelets: 397 10*3/uL (ref 150–400)
RBC: 3.84 MIL/uL — ABNORMAL LOW (ref 3.87–5.11)
RDW: 13.7 % (ref 11.5–15.5)
WBC: 12.5 10*3/uL — ABNORMAL HIGH (ref 4.0–10.5)

## 2016-03-17 LAB — URINALYSIS, ROUTINE W REFLEX MICROSCOPIC
Bilirubin Urine: NEGATIVE
Glucose, UA: NEGATIVE mg/dL
Hgb urine dipstick: NEGATIVE
Leukocytes, UA: NEGATIVE
Nitrite: NEGATIVE
Protein, ur: NEGATIVE mg/dL
Specific Gravity, Urine: >1.03 — ABNORMAL HIGH (ref 1.005–1.030)
pH: 5.5 (ref 5.0–8.0)

## 2016-03-17 LAB — CULTURE, BLOOD (ROUTINE X 2)
Culture: NO GROWTH
Culture: NO GROWTH

## 2016-03-17 LAB — BASIC METABOLIC PANEL
Anion gap: 7 (ref 5–15)
BUN: 12 mg/dL (ref 6–20)
CO2: 22 mmol/L (ref 22–32)
Calcium: 8.7 mg/dL — ABNORMAL LOW (ref 8.9–10.3)
Chloride: 103 mmol/L (ref 101–111)
Creatinine, Ser: 0.91 mg/dL (ref 0.44–1.00)
GFR calc Af Amer: >60 mL/min (ref >60)
GFR calc non Af Amer: >60 mL/min (ref >60)
Glucose, Bld: 104 mg/dL — ABNORMAL HIGH (ref 65–99)
Potassium: 3.7 mmol/L (ref 3.5–5.1)
Sodium: 132 mmol/L — ABNORMAL LOW (ref 135–145)

## 2016-03-17 LAB — BRAIN NATRIURETIC PEPTIDE: B Natriuretic Peptide: 221 pg/mL — ABNORMAL HIGH (ref 0.0–100.0)

## 2016-03-17 LAB — LACTIC ACID, PLASMA: Lactic Acid, Venous: 1 mmol/L (ref 0.5–1.9)

## 2016-03-17 LAB — TROPONIN I: Troponin I: 5.51 ng/mL (ref <0.03)

## 2016-03-17 MED ORDER — SODIUM CHLORIDE 0.9 % IV SOLN
INTRAVENOUS | Status: DC
  Administered 2016-03-17 – 2016-03-18 (×2): via INTRAVENOUS

## 2016-03-17 MED ORDER — SODIUM CHLORIDE 0.9 % IV BOLUS (SEPSIS)
500.0000 mL | Freq: Once | INTRAVENOUS | Status: AC
  Administered 2016-03-17: 500 mL via INTRAVENOUS

## 2016-03-17 MED ORDER — ACETAMINOPHEN 500 MG PO TABS: 500.0000 mg | ORAL_TABLET | Freq: Three times a day (TID) | ORAL | Status: DC | PRN

## 2016-03-17 MED ORDER — HEPARIN SODIUM (PORCINE) 5000 UNIT/ML IJ SOLN
5000.0000 [IU] | Freq: Three times a day (TID) | INTRAMUSCULAR | Status: DC
  Administered 2016-03-18 (×3): 5000 [IU] via SUBCUTANEOUS
  Filled 2016-03-17 (×3): qty 1

## 2016-03-17 MED ORDER — ATORVASTATIN CALCIUM 40 MG PO TABS
80.0000 mg | ORAL_TABLET | Freq: Every day | ORAL | Status: DC
  Filled 2016-03-17: qty 2

## 2016-03-17 NOTE — ED Notes (Signed)
CRITICAL VALUE ALERT  Critical value received:  Troponin 5.51  Date of notification:  03/17/2018  Time of notification:  1925  Critical value read back:Yes.    Nurse who received alert:  Joellyn Rued, RN  MD notified (1st page):  Dr Thurnell Garbe  Time of first page:  1925  MD notified (2nd page):  Time of second page:  Responding MD:  Dr Thurnell Garbe  Time MD responded:  [phone]

## 2016-03-17 NOTE — H&P (Signed)
History and Physical    [name] [id number] DOB: 01/30/1961 DOA: 03/17/2016  PCP: Jonathon Bellows, MD  Patient coming from: Home   Chief Complaint: Weakness   HPI: Erin [name] is a 55 y.o. female with medical history significant for GERD, HTN, HLD, Nose cancer, CAD 08/2015 with PCI, and a NSTEMI last week, presented with complaints of generalized weakness and lightheadedness that onset 2 days ago. Patient was discharged 2 days ago s/p stent placement. She has associated nausea, vomiting, and diarrhea. She denies any shortness of breath or chest pain. While in the ED, she was noted to be hypotensive in which her blood pressure medications have been held and she has been started on IV fluids. EDP spoke with cardiology who recommneded IVF and cardiology consultation to adjust her medications.  Hospitalist was asked to admit the patient for further evaluation.   ED Course: Troponin 5.51, BNP 221.0, WBC 12.5, and hemoglobin 11.0.   Review of Systems: As per HPI otherwise 10 point review of systems negative.    Past Medical History:  Diagnosis Date  . Basal cell carcinoma of forehead    "burned off" (03/14/2016)  . Benign essential HTN 09/06/2015  . Coronary artery disease    a. 03/11/16 PCI wited DES-->Prox/Mid Cx, 03/14/16 PCI with DES x2-->RCA, EF 30-35%  . GERD (gastroesophageal reflux disease)   . GERD (gastroesophageal reflux disease) 09/06/2015  . Hyperlipidemia 09/06/2015  . Hypertension   . Myocardial infarction 03/10/2016  . Pneumonia 03/10/2016  . Squamous cell cancer of skin of nose    "burned off" (03/14/2016)  . Wears dentures    top  . Wears glasses     Past Surgical History:  Procedure Laterality Date  . APPENDECTOMY    . CARDIAC CATHETERIZATION N/A 03/11/2016   Procedure: Left Heart Cath and Coronary Angiography;  Surgeon: Leonie Man, MD;  Location: [place] [facility];  Service: Cardiovascular;  Laterality: N/A;  . CARDIAC CATHETERIZATION N/A 03/11/2016   Procedure: Coronary Stent Intervention;  Surgeon: Leonie Man, MD;  Location: [place] [facility];  Service: Cardiovascular;  Laterality: N/A;  . CARDIAC CATHETERIZATION N/A 03/14/2016   Procedure: Coronary Stent Intervention;  Surgeon: Sheyanne Munley M Martinique, MD;  Location: [facility];  Service: Cardiovascular;  Laterality: N/A;  . CHOLECYSTECTOMY OPEN  1984  . CORONARY ANGIOPLASTY WITH STENT PLACEMENT  03/14/2016  . FINGER ARTHROPLASTY Left 05/14/2013   Procedure: LEFT THUMB CARPAL METACARPAL ARTHROPLASTY;  Surgeon: Tennis Must, MD;  Location: [facility];  Service: Orthopedics;  Laterality: Left;  . TUBAL LIGATION  1987  . VAGINAL HYSTERECTOMY  2009     reports that she has been smoking Cigarettes.  She has a 19.00 pack-year smoking history. She has never used smokeless tobacco. She reports that she drinks about 3.6 oz of alcohol per week . She reports that she uses drugs, including Marijuana.  No Known Allergies  Family History  Problem Relation Age of Onset  . Stroke Mother   . Hypertension Mother   . Diabetes Mother   . Heart failure Other      Prior to Admission medications   Medication Sig Start Date End Date Taking? Authorizing Provider  acetaminophen (TYLENOL) 500 MG tablet Take 1 tablet (500 mg total) by mouth every 8 (eight) hours as needed for mild pain, moderate pain or headache. 03/15/16  Yes Cheryln Manly, NP  AMBULATORY NON FORMULARY MEDICATION Take 90 mg by mouth 2 (two) times daily. Medication Name:  BRILINTA 90 mg BID (TWILIGHT Research study PROVIDED) 03/15/16  Yes Burnell Blanks, MD  AMBULATORY NON FORMULARY MEDICATION Take 81 mg by mouth daily. Medication Name: Aspirin 81 mg daily (TWILIGHT Research Study PROVIDED) 03/15/16  Yes Burnell Blanks, MD  atorvastatin (LIPITOR) 80 MG tablet Take 1 tablet (80 mg total) by mouth daily at 6 PM. 03/15/16  Yes Cheryln Manly, NP  levofloxacin (LEVAQUIN) 500 MG tablet Take 1 tablet (500 mg  total) by mouth daily. 03/15/16  Yes Cheryln Manly, NP  lisinopril (PRINIVIL,ZESTRIL) 5 MG tablet Take 1 tablet (5 mg total) by mouth daily. 03/15/16  Yes Cheryln Manly, NP  metoprolol tartrate (LOPRESSOR) 25 MG tablet Take 1 tablet (25 mg total) by mouth 2 (two) times daily. 09/06/15  Yes Almyra Deforest, PA  nitroGLYCERIN (NITROSTAT) 0.4 MG SL tablet Place 1 tablet (0.4 mg total) under the tongue every 5 (five) minutes x 3 doses as needed for chest pain. 09/06/15  Yes Almyra Deforest, [name]    Physical Exam: Vitals:   03/17/16 1900 03/17/16 1930 03/17/16 2000 03/17/16 2100  BP: 91/59 94/60 94/60  100/66  Pulse: 83 79 86 81  Resp:      Temp:      TempSrc:      SpO2: 99% 99% 99% 100%  Weight:      Height:          Constitutional: NAD, calm, comfortable Vitals:   03/17/16 1900 03/17/16 1930 03/17/16 2000 03/17/16 2100  BP: 91/59 94/60 94/60  100/66  Pulse: 83 79 86 81  Resp:      Temp:      TempSrc:      SpO2: 99% 99% 99% 100%  Weight:      Height:       Eyes: PERRL, lids and conjunctivae normal ENMT: Mucous membranes are moist. Posterior pharynx clear of any exudate or lesions.Normal dentition.  Neck: normal, supple, no masses, no thyromegaly Respiratory: clear to auscultation bilaterally, no wheezing, no crackles. Normal respiratory effort. No accessory muscle use.  Cardiovascular: Regular rate and rhythm, no murmurs / rubs / gallops. No extremity edema. 2+ pedal pulses. No carotid bruits.  Abdomen: no tenderness, no masses palpated. No hepatosplenomegaly. Bowel sounds positive.  Musculoskeletal: no clubbing / cyanosis. No joint deformity upper and lower extremities. Good ROM, no contractures. Normal muscle tone.  Skin: no rashes, lesions, ulcers. No induration Neurologic: CN 2-12 grossly intact. Sensation intact, DTR normal. Strength 5/5 in all 4.  Psychiatric: Normal judgment and insight. Alert and oriented x 3. Normal mood.    Labs on Admission: I have personally reviewed following  labs and imaging studies   Recent Labs Lab 03/12/16 0536 03/13/16 0336 03/14/16 0506 03/15/16 0509 03/17/16 1822  WBC 14.0* 14.7* 14.3* 12.2* 12.5*  NEUTROABS  --   --   --   --  9.0*  HGB 11.8* 12.6 13.5 11.7* 11.0*  HCT 35.6* 37.1 39.9 35.2* 32.3*  MCV 85.8 83.7 84.5 84.4 84.1  PLT 325 349 366 361 397    Recent Labs Lab 03/12/16 0536 03/13/16 0336 03/13/16 1106 03/14/16 0506 03/15/16 0509 03/17/16 1822  NA 135 138  --  136 135 132*  K 3.5 2.8*  --  3.8 3.9 3.7  CL 101 100*  --  105 108 103  CO2 25 28  --  21* 19* 22  GLUCOSE 113* 114*  --  102* 99 104*  BUN 12 9  --  8 11 12   CREATININE 0.88  0.77  --  0.86 0.87 0.91  CALCIUM 8.9 8.9  --  9.4 9.0 8.7*  MG  --   --  2.1  --   --   --    Recent Labs Lab 03/14/16 0506  INR 1.04    Recent Labs Lab 03/11/16 1700 03/12/16 0017 03/12/16 0536 03/12/16 1258 03/17/16 1822  TROPONINI 40.82* >65.00* >65.00* >65.00* 5.51*    Recent Results (from the past 240 hour(s))  Culture, blood (Routine X 2) w Reflex to ID Panel     Status: None   Collection Time: 03/12/16  3:20 PM  Result Value Ref Range Status   Specimen Description BLOOD BLOOD RIGHT ARM  Final   Special Requests BOTTLES DRAWN AEROBIC ONLY 5CC  Final   Culture NO GROWTH 5 DAYS  Final   Report Status 03/17/2016 FINAL  Final  Culture, blood (Routine X 2) w Reflex to ID Panel     Status: None   Collection Time: 03/12/16  3:25 PM  Result Value Ref Range Status   Specimen Description BLOOD BLOOD RIGHT HAND  Final   Special Requests BOTTLES DRAWN AEROBIC ONLY 4CC  Final   Culture NO GROWTH 5 DAYS  Final   Report Status 03/17/2016 FINAL  Final  C difficile quick scan w PCR reflex     Status: None   Collection Time: 03/14/16  8:46 PM  Result Value Ref Range Status   C Diff antigen NEGATIVE NEGATIVE Final   C Diff toxin NEGATIVE NEGATIVE Final   C Diff interpretation No C. difficile detected.  Final     Radiological Exams on Admission: Dg Abd Acute  W/chest  Result Date: 03/17/2016 CLINICAL DATA:  55 y/o F; weakness, dizziness, and low blood pressure with recent cardiac stent placement. EXAM: DG ABDOMEN ACUTE W/ 1V CHEST COMPARISON:  03/12/2016 chest radiograph FINDINGS: There is no evidence of dilated bowel loops or free intraperitoneal air. No radiopaque calculi or other significant radiographic abnormality is seen. Heart size and mediastinal contours are within normal limits. Both lungs are clear, interval resolution of right lung base opacity. Right upper quadrant surgical clips, presumably cholecystectomy. Degenerative changes of the symphysis pubis. IMPRESSION: Negative abdominal radiographs.  No acute cardiopulmonary disease. Electronically Signed   By: Kristine Garbe M.D.   On: 03/17/2016 19:18    EKG: Independently reviewed. Sinus tachycardia.   Assessment/Plan  1. Light-headedness/generalized weakness. Suspect she may be a little volume depleted.  She already felt a little better with IVF bolus.  Will continue IV fluids and adjust medication.  For now, will hold both her betablocker and ACE I.  2. Elevated troponin. Patient has an elevated troponin of 5.51.  This was leveling off her last NSTEMI.  Will repeat another one in the am.  Leukocytosis. Elevates WBC of 12.5. Follow cultures.  3. Recent hx of NSTEMI. Patient was discharged 2 days ago with stent placement.  She had successful PTCA and stent placements.  4. HTN. Pressures are low. Will hold lisinopril and metoprolol for now.  5. HLD. Continue statin.   DVT prophylaxis: SubQ Heparin.  Code Status: FULL  Family Communication: boyfriend bedside Disposition Plan: Discharge home once improved.  Consults called: None  Admission status: Inpatient    Orvan Falconer, MD FACP [facility] If 7PM-7AM, please contact night-coverage [email or web address] Password [email or web address]  03/17/2016, 9:27 PM    By signing my name below, I, Collene Leyden, attest that this documentation has been  prepared under the direction and in the presence  of Orvan Falconer, MD. Electronically signed: Collene Leyden, Scribe. 03/17/16

## 2016-03-17 NOTE — ED Triage Notes (Signed)
Pt had 1 cardiac stent placed Friday and 2 more stents placed Sunday at [facility].  Was discharged Tuesday and since then has had weakness, dizziness, and low bp.  Also reports no appetite.

## 2016-03-17 NOTE — Telephone Encounter (Signed)
Have her come by for BP check please.

## 2016-03-17 NOTE — ED Provider Notes (Signed)
[facility] DEPT Provider Note   CSN: [id number] Arrival date & time: 03/17/16  1701     History   Chief Complaint Chief Complaint  Patient presents with  . Hypotension  . Near Syncope    HPI Erin [name] is a 55 y.o. female.  HPI  Pt was seen at [facility]. Per pt, c/o gradual onset and persistence of constant generalized weakness and lightheadedness since she was discharged from the hospital 2 days ago for s/p cardiac stent placement. Pt states she has had several episodes of N/V since being discharged. Pt states she had "diarrhea" while admitted, which has continued after discharge. Pt states she has had decreased PO intake since hospital discharged. Pt states she took her BP at home and "couldn't find it because it was low." Endorses compliance with her meds. Denies CP/palpitations, no SOB/cough, no abd pain, no fevers, no back pain, no focal motor weakness, no syncope.    Past Medical History:  Diagnosis Date  . Basal cell carcinoma of forehead    "burned off" (03/14/2016)  . Benign essential HTN 09/06/2015  . Coronary artery disease    a. 03/11/16 PCI wited DES-->Prox/Mid Cx, 03/14/16 PCI with DES x2-->RCA, EF 30-35%  . GERD (gastroesophageal reflux disease)   . GERD (gastroesophageal reflux disease) 09/06/2015  . Hyperlipidemia 09/06/2015  . Hypertension   . Myocardial infarction 03/10/2016  . Pneumonia 03/10/2016  . Squamous cell cancer of skin of nose    "burned off" (03/14/2016)  . Wears dentures    top  . Wears glasses     Patient Active Problem List   Diagnosis Date Noted  . Tobacco abuse 03/12/2016  . NSTEMI (non-ST elevated myocardial infarction) ([place]) 03/11/2016  . Pain in the chest   . Benign essential HTN 09/06/2015  . Hyperlipidemia 09/06/2015  . GERD (gastroesophageal reflux disease) 09/06/2015  . Chest pain 09/06/2015    Past Surgical History:  Procedure Laterality Date  . APPENDECTOMY    . CARDIAC CATHETERIZATION N/A 03/11/2016   Procedure: Left  Heart Cath and Coronary Angiography;  Surgeon: Leonie Man, MD;  Location: [facility];  Service: Cardiovascular;  Laterality: N/A;  . CARDIAC CATHETERIZATION N/A 03/11/2016   Procedure: Coronary Stent Intervention;  Surgeon: Leonie Man, MD;  Location: [facility];  Service: Cardiovascular;  Laterality: N/A;  . CARDIAC CATHETERIZATION N/A 03/14/2016   Procedure: Coronary Stent Intervention;  Surgeon: Peter M Martinique, MD;  Location: [facility];  Service: Cardiovascular;  Laterality: N/A;  . CHOLECYSTECTOMY OPEN  1984  . CORONARY ANGIOPLASTY WITH STENT PLACEMENT  03/14/2016  . FINGER ARTHROPLASTY Left 05/14/2013   Procedure: LEFT THUMB CARPAL METACARPAL ARTHROPLASTY;  Surgeon: Tennis Must, MD;  Location: [facility];  Service: Orthopedics;  Laterality: Left;  . TUBAL LIGATION  1987  . VAGINAL HYSTERECTOMY  2009    OB History    Gravida Para Term Preterm AB Living   4 4 4     4    SAB TAB Ectopic Multiple Live Births                   Home Medications    Prior to Admission medications   Medication Sig Start Date End Date Taking? Authorizing Provider  acetaminophen (TYLENOL) 500 MG tablet Take 1 tablet (500 mg total) by mouth every 8 (eight) hours as needed for mild pain, moderate pain or headache. 03/15/16   Cheryln Manly, NP  AMBULATORY NON FORMULARY MEDICATION Take 90  mg by mouth 2 (two) times daily. Medication Name: BRILINTA 90 mg BID (TWILIGHT Research study PROVIDED) 03/15/16   Burnell Blanks, MD  AMBULATORY NON FORMULARY MEDICATION Take 81 mg by mouth daily. Medication Name: Aspirin 81 mg daily (TWILIGHT Research Study PROVIDED) 03/15/16   Burnell Blanks, MD  atorvastatin (LIPITOR) 80 MG tablet Take 1 tablet (80 mg total) by mouth daily at 6 PM. 03/15/16   Cheryln Manly, NP  levofloxacin (LEVAQUIN) 500 MG tablet Take 1 tablet (500 mg total) by mouth daily. 03/15/16   Cheryln Manly, NP  lisinopril (PRINIVIL,ZESTRIL) 5 MG  tablet Take 1 tablet (5 mg total) by mouth daily. 03/15/16   Cheryln Manly, NP  metoprolol tartrate (LOPRESSOR) 25 MG tablet Take 1 tablet (25 mg total) by mouth 2 (two) times daily. 09/06/15   Almyra Deforest, PA  nitroGLYCERIN (NITROSTAT) 0.4 MG SL tablet Place 1 tablet (0.4 mg total) under the tongue every 5 (five) minutes x 3 doses as needed for chest pain. 09/06/15   Almyra Deforest, PA    Family History Family History  Problem Relation Age of Onset  . Stroke Mother   . Hypertension Mother   . Diabetes Mother   . Heart failure Other     Social History Social History  Substance Use Topics  . Smoking status: Current Every Day Smoker    Packs/day: 0.50    Years: 38.00    Types: Cigarettes  . Smokeless tobacco: Never Used     Comment: pt says hasn't smoked since Thursday  . Alcohol use 3.6 oz/week    6 Cans of beer per week     Comment: occ     Allergies   Patient has no known allergies.   Review of Systems Review of Systems ROS: Statement: All systems negative except as marked or noted in the HPI; Constitutional: Negative for fever and chills. +generalized weakness.; ; Eyes: Negative for eye pain, redness and discharge. ; ; ENMT: Negative for ear pain, hoarseness, nasal congestion, sinus pressure and sore throat. ; ; Cardiovascular: Negative for chest pain, palpitations, diaphoresis, dyspnea and peripheral edema. ; ; Respiratory: Negative for cough, wheezing and stridor. ; ; Gastrointestinal: +N/V/D. Negative for abdominal pain, blood in stool, hematemesis, jaundice and rectal bleeding. . ; ; Genitourinary: Negative for dysuria, flank pain and hematuria. ; ; Musculoskeletal: Negative for back pain and neck pain. Negative for swelling and trauma.; ; Skin: Negative for pruritus, rash, abrasions, blisters, bruising and skin lesion.; ; Neuro: +lightheadedness. Negative for headache and neck stiffness. Negative for altered level of consciousness, altered mental status, extremity weakness,  paresthesias, involuntary movement, seizure and syncope.       Physical Exam Updated Vital Signs BP 103/65 (BP Location: Left Arm)   Pulse 99   Temp 98.7 F (37.1 C) (Temporal)   Resp 16   Ht 5' (1.524 m)   Wt 112 lb (50.8 kg)   SpO2 100%   BMI 21.87 kg/m   18:01:13 Orthostatic Vital Signs TV  Orthostatic Lying   BP- Lying: 91/61  Pulse- Lying: 88      Orthostatic Sitting  BP- Sitting: 102/66  Pulse- Sitting: 103      Orthostatic Standing at 0 minutes  BP- Standing at 0 minutes: 109/72  Pulse- Standing at 0 minutes: 108     Physical Exam 1810: Physical examination:  Nursing notes reviewed; Vital signs and O2 SAT reviewed;  Constitutional: Well developed, Well nourished, Well hydrated, In no acute distress; Head:  Normocephalic, atraumatic; Eyes: EOMI, PERRL, No scleral icterus; ENMT: Mouth and pharynx normal, Mucous membranes moist; Neck: Supple, Full range of motion, No lymphadenopathy; Cardiovascular: Regular rate and rhythm, No gallop; Respiratory: Breath sounds clear & equal bilaterally, No wheezes.  Speaking full sentences with ease, Normal respiratory effort/excursion; Chest: Nontender, Movement normal; Abdomen: Soft, Nontender, Nondistended, Normal bowel sounds; Genitourinary: No CVA tenderness; Extremities: Pulses normal, No tenderness, No edema, No calf edema or asymmetry.; Neuro: AA&Ox3, Major CN grossly intact.  Speech clear. No gross focal motor or sensory deficits in extremities.; Skin: Color normal, Warm, Dry.   ED Treatments / Results  Labs (all labs ordered are listed, but only abnormal results are displayed)   EKG  EKG Interpretation  Date/Time:  Thursday March 17 2016 17:25:40 EST Ventricular Rate:  102 PR Interval:  120 QRS Duration: 62 QT Interval:  340 QTC Calculation: 443 R Axis:   23 Text Interpretation:  Sinus tachycardia Biatrial enlargement Baseline wander When compared with ECG of 03/15/2016 Rate faster Confirmed by [facility]  MD,  Nunzio [name] [phone]) on 03/17/2016 6:36:53 PM       Radiology   Procedures Procedures (including critical care time)  Medications Ordered in ED Medications - No data to display   Initial Impression / Assessment and Plan / ED Course  I have reviewed the triage vital signs and the nursing notes.  Pertinent labs & imaging results that were available during my care of the patient were reviewed by me and considered in my medical decision making (see chart for details).  MDM Reviewed: nursing note, previous chart and vitals Reviewed previous: labs and ECG Interpretation: labs, ECG and x-ray   Results for orders placed or performed during the hospital encounter of [id number]  Basic metabolic panel  Result Value Ref Range   Sodium 132 (L) 135 - 145 mmol/L   Potassium 3.7 3.5 - 5.1 mmol/L   Chloride 103 101 - 111 mmol/L   CO2 22 22 - 32 mmol/L   Glucose, Bld 104 (H) 65 - 99 mg/dL   BUN 12 6 - 20 mg/dL   Creatinine, Ser 0.91 0.44 - 1.00 mg/dL   Calcium 8.7 (L) 8.9 - 10.3 mg/dL   GFR calc non Af Amer >60 >60 mL/min   GFR calc Af Amer >60 >60 mL/min   Anion gap 7 5 - 15  Brain natriuretic peptide  Result Value Ref Range   B Natriuretic Peptide 221.0 (H) 0.0 - 100.0 pg/mL  Troponin I  Result Value Ref Range   Troponin I 5.51 (HH) <0.03 ng/mL  Lactic acid, plasma  Result Value Ref Range   Lactic Acid, Venous 1.0 0.5 - 1.9 mmol/L  CBC with Differential  Result Value Ref Range   WBC 12.5 (H) 4.0 - 10.5 K/uL   RBC 3.84 (L) 3.87 - 5.11 MIL/uL   Hemoglobin 11.0 (L) 12.0 - 15.0 g/dL   HCT 32.3 (L) 36.0 - 46.0 %   MCV 84.1 78.0 - 100.0 fL   MCH 28.6 26.0 - 34.0 pg   MCHC 34.1 30.0 - 36.0 g/dL   RDW 13.7 11.5 - 15.5 %   Platelets 397 150 - 400 K/uL   Neutrophils Relative % 72 %   Neutro Abs 9.0 (H) 1.7 - 7.7 K/uL   Lymphocytes Relative 13 %   Lymphs Abs 1.6 0.7 - 4.0 K/uL   Monocytes Relative 14 %   Monocytes Absolute 1.7 (H) 0.1 - 1.0 K/uL   Eosinophils Relative 1 %    Eosinophils  Absolute 0.1 0.0 - 0.7 K/uL   Basophils Relative 0 %   Basophils Absolute 0.0 0.0 - 0.1 K/uL   Dg Abd Acute W/chest Result Date: 03/17/2016 CLINICAL DATA:  55 y/o F; weakness, dizziness, and low blood pressure with recent cardiac stent placement. EXAM: DG ABDOMEN ACUTE W/ 1V CHEST COMPARISON:  03/12/2016 chest radiograph FINDINGS: There is no evidence of dilated bowel loops or free intraperitoneal air. No radiopaque calculi or other significant radiographic abnormality is seen. Heart size and mediastinal contours are within normal limits. Both lungs are clear, interval resolution of right lung base opacity. Right upper quadrant surgical clips, presumably cholecystectomy. Degenerative changes of the symphysis pubis. IMPRESSION: Negative abdominal radiographs.  No acute cardiopulmonary disease. Electronically Signed   By: Kristine Garbe M.D.   On: 03/17/2016 19:18    2100:  Pt not orthostatic on VS. VS in ED today are slightly lower than during last admission (SBP dipping into 80's), per EPIC chart review. Pt troponin elevated, but trending downward. Neuro exam non-focal, appears NAD. No vomiting/diarrhea while in the ED. Judicious IVF bolus and gtt given. T/C to [facility] Cards Fellow Dr. Koleen Nimrod, case discussed, including:  HPI, pertinent PM/SHx, VS/PE, dx testing, ED course and treatment:  Likely volume depleted from reported vomiting/diarrhea and may need meds adjusted, recommends observation admit overnight for IVF and Cards MD eval meds in morning, OK to stay at [facility].  Dx and testing, as well as d/w Cards MD, d/w pt and family.  Questions answered.  Verb understanding, agreeable to admit. T/C to [facility]. Marin Comment, case discussed, including:  HPI, pertinent PM/SHx, VS/PE, dx testing, ED course and treatment:  Agreeable to admit, requests he will come to the ED for evaluation.    Final Clinical Impressions(s) / ED Diagnoses   Final diagnoses:  None    New Prescriptions New  Prescriptions   No medications on file     Francine Graven, DO 03/20/16 1640

## 2016-03-17 NOTE — Telephone Encounter (Signed)
Patient would like to speak with nurse regarding BP reading and symptoms. / tg

## 2016-03-17 NOTE — Telephone Encounter (Signed)
Had 2 stents this past weekend,states her fiend is an Therapist, sports and couldn't get BP reading and her machine at home wouldn't register BP.States she feels like she is walking in a tunnel and wonders if her meds need to be reduced

## 2016-03-18 DIAGNOSIS — I952 Hypotension due to drugs: Secondary | ICD-10-CM

## 2016-03-18 DIAGNOSIS — E784 Other hyperlipidemia: Secondary | ICD-10-CM

## 2016-03-18 DIAGNOSIS — R42 Dizziness and giddiness: Secondary | ICD-10-CM

## 2016-03-18 LAB — CBC
HCT: 27.7 % — ABNORMAL LOW (ref 36.0–46.0)
HCT: 27.5 % — ABNORMAL LOW (ref 36.0–46.0)
Hemoglobin: 9.3 g/dL — ABNORMAL LOW (ref 12.0–15.0)
Hemoglobin: 9.2 g/dL — ABNORMAL LOW (ref 12.0–15.0)
MCH: 28.4 pg (ref 26.0–34.0)
MCH: 28.4 pg (ref 26.0–34.0)
MCHC: 33.6 g/dL (ref 30.0–36.0)
MCHC: 33.5 g/dL (ref 30.0–36.0)
MCV: 84.7 fL (ref 78.0–100.0)
MCV: 84.9 fL (ref 78.0–100.0)
Platelets: 390 10*3/uL (ref 150–400)
Platelets: 372 10*3/uL (ref 150–400)
RBC: 3.27 MIL/uL — ABNORMAL LOW (ref 3.87–5.11)
RBC: 3.24 MIL/uL — ABNORMAL LOW (ref 3.87–5.11)
RDW: 13.1 % (ref 11.5–15.5)
RDW: 13.8 % (ref 11.5–15.5)
WBC: 9.9 10*3/uL (ref 4.0–10.5)
WBC: 10.4 10*3/uL (ref 4.0–10.5)

## 2016-03-18 LAB — BASIC METABOLIC PANEL
Anion gap: 6 (ref 5–15)
BUN: 9 mg/dL (ref 6–20)
CO2: 20 mmol/L — ABNORMAL LOW (ref 22–32)
Calcium: 8 mg/dL — ABNORMAL LOW (ref 8.9–10.3)
Chloride: 108 mmol/L (ref 101–111)
Creatinine, Ser: 0.76 mg/dL (ref 0.44–1.00)
GFR calc Af Amer: >60 mL/min (ref >60)
GFR calc non Af Amer: >60 mL/min (ref >60)
Glucose, Bld: 92 mg/dL (ref 65–99)
Potassium: 3.3 mmol/L — ABNORMAL LOW (ref 3.5–5.1)
Sodium: 134 mmol/L — ABNORMAL LOW (ref 135–145)

## 2016-03-18 LAB — FOLATE: Folate: 22.4 ng/mL (ref >5.9)

## 2016-03-18 LAB — RETICULOCYTES
RBC.: 3.64 MIL/uL — ABNORMAL LOW (ref 3.87–5.11)
Retic Count, Absolute: 25.5 10*3/uL (ref 19.0–186.0)
Retic Ct Pct: 0.7 % (ref 0.4–3.1)

## 2016-03-18 LAB — IRON AND TIBC
Iron: 10 ug/dL — ABNORMAL LOW (ref 28–170)
Saturation Ratios: 4 % — ABNORMAL LOW (ref 10.4–31.8)
TIBC: 252 ug/dL (ref 250–450)
UIBC: 242 ug/dL

## 2016-03-18 LAB — VITAMIN B12: Vitamin B-12: 883 pg/mL (ref 180–914)

## 2016-03-18 LAB — TROPONIN I: Troponin I: 4.61 ng/mL (ref <0.03)

## 2016-03-18 LAB — FERRITIN: Ferritin: 356 ng/mL — ABNORMAL HIGH (ref 11–307)

## 2016-03-18 MED ORDER — TICAGRELOR 90 MG PO TABS
90.0000 mg | ORAL_TABLET | Freq: Two times a day (BID) | ORAL | Status: DC
  Administered 2016-03-18: 90 mg via ORAL
  Filled 2016-03-18 (×8): qty 1

## 2016-03-18 MED ORDER — ASPIRIN 81 MG PO TBEC: 81.0000 mg | DELAYED_RELEASE_TABLET | Freq: Every day | ORAL | 0 refills | Status: DC

## 2016-03-18 MED ORDER — POTASSIUM CHLORIDE CRYS ER 20 MEQ PO TBCR
40.0000 meq | EXTENDED_RELEASE_TABLET | Freq: Once | ORAL | Status: AC
  Administered 2016-03-18: 40 meq via ORAL
  Filled 2016-03-18: qty 2

## 2016-03-18 MED ORDER — ASPIRIN EC 81 MG PO TBEC
81.0000 mg | DELAYED_RELEASE_TABLET | Freq: Every day | ORAL | Status: DC
  Administered 2016-03-18: 81 mg via ORAL
  Filled 2016-03-18: qty 1

## 2016-03-18 MED ORDER — TICAGRELOR 90 MG PO TABS: 90.0000 mg | ORAL_TABLET | Freq: Two times a day (BID) | ORAL | 0 refills | Status: DC

## 2016-03-18 NOTE — Telephone Encounter (Signed)
Line busy-cc

## 2016-03-18 NOTE — Discharge Summary (Signed)
Physician Discharge Summary  Alphonsus Sias WV:[id number] DOB: [birth year] DOA: 03/17/2016  PCP: Jonathon Bellows, MD  Admit date: 03/17/2016 Discharge date: 03/18/2016  Admitted From: Home Disposition:  Home  Recommendations for Outpatient Follow-up:  1. Follow up with PCP in 2-3 weeks 2. Follow up with Cardiology as scheduled in one week  Discharge Condition:Stable CODE STATUS:Full Diet recommendation: Heart healthy   Brief/Interim Summary: 55 y.o. female with medical history significant for GERD, HTN, HLD, Nose cancer, CAD 08/2015 with PCI, and a NSTEMI last week, presented with complaints of generalized weakness and lightheadedness that onset 2 days ago. Patient was discharged 2 days ago s/p stent placement. She has associated nausea, vomiting, and diarrhea. She denies any shortness of breath or chest pain. While in the ED, she was noted to be hypotensive in which her blood pressure medications have been held and she has been started on IV fluids. EDP spoke with cardiology who recommneded IVF and cardiology consultation to adjust her medications.  Hospitalist was asked to admit the patient for further evaluation  1. Light-headedness/generalized weakness. 1. Improved with IVF hydration 2. Beta blocker and ACEI were placed on hold given hypotension, would defer to Cardiology when and if to resume 3. Blood pressures improved 4. Patient ambulated in hallways without difficulty 2. Elevated troponin. 1. Patient presented with trop of over 5 2. Cardiology consulted 3. Elevated troponin likely residual elevated troponin from recent NSTEMI 4. Patient remained symptom free 5. Discussed case with Cardiology, patient cleared for discharge 3. Recent hx of NSTEMI.  1. Patient was discharged 2 days ago with stent placement.   2. She had successful PTCA and stent placements.  4. HTN.  1. Pressures presented low.  2. Held lisinopril and metoprolol per above 5. HLD.  1. Continued statin.    Discharge Diagnoses:  Principal Problem:   Lightheadedness Active Problems:   Hyperlipidemia   GERD (gastroesophageal reflux disease)   NSTEMI (non-ST elevated myocardial infarction) (HCC)   Tobacco abuse   Hypotension    Discharge Instructions     Medication List    STOP taking these medications   lisinopril 5 MG tablet Commonly known as:  PRINIVIL,ZESTRIL   metoprolol tartrate 25 MG tablet Commonly known as:  LOPRESSOR     TAKE these medications   acetaminophen 500 MG tablet Commonly known as:  TYLENOL Take 1 tablet (500 mg total) by mouth every 8 (eight) hours as needed for mild pain, moderate pain or headache.   AMBULATORY NON FORMULARY MEDICATION Take 90 mg by mouth 2 (two) times daily. Medication Name: BRILINTA 90 mg BID (TWILIGHT Research study PROVIDED)   AMBULATORY NON FORMULARY MEDICATION Take 81 mg by mouth daily. Medication Name: Aspirin 81 mg daily (TWILIGHT Research Study PROVIDED)   aspirin 81 MG EC tablet Take 1 tablet (81 mg total) by mouth daily. Start taking on:  03/19/2016   atorvastatin 80 MG tablet Commonly known as:  LIPITOR Take 1 tablet (80 mg total) by mouth daily at 6 PM.   levofloxacin 500 MG tablet Commonly known as:  LEVAQUIN Take 1 tablet (500 mg total) by mouth daily.   nitroGLYCERIN 0.4 MG SL tablet Commonly known as:  NITROSTAT Place 1 tablet (0.4 mg total) under the tongue every 5 (five) minutes x 3 doses as needed for chest pain.   ticagrelor 90 MG Tabs tablet Commonly known as:  BRILINTA Take 1 tablet (90 mg total) by mouth 2 (two) times daily.      Follow-up Information  Carlyle Dolly, MD. Schedule an appointment as soon as possible for a visit in 1 week(s).   Specialty:  Cardiology Contact information: [address] [place] [address] [phone]        Jonathon Bellows, MD. Schedule an appointment as soon as possible for a visit in 2 week(s).   Specialty:  Family Medicine Contact  information: [address] [place] [address] [phone]          No Known Allergies  Consultations:  Cardiology  Procedures/Studies: Dg Chest Port 1 View  Result Date: 03/12/2016 CLINICAL DATA:  Pt having SOB, no cp. Hx of hypertension. Cardiac cath 03/11/16. Smoker for 11 years. EXAM: PORTABLE CHEST 1 VIEW COMPARISON:  03/11/2016 FINDINGS: Opacity has developed at the right lung base since the prior study. This is likely atelectasis. Consider pneumonia if there are consistent clinical symptoms. Remainder of the lungs is clear. No convincing pleural effusion. No pneumothorax. Heart, mediastinum and hila are unremarkable. IMPRESSION: 1. New right lung base opacity most likely atelectasis. Pneumonia is possible. Electronically Signed   By: Lajean Manes M.D.   On: 03/12/2016 09:19   Dg Abd Acute W/chest  Result Date: 03/17/2016 CLINICAL DATA:  55 y/o F; weakness, dizziness, and low blood pressure with recent cardiac stent placement. EXAM: DG ABDOMEN ACUTE W/ 1V CHEST COMPARISON:  03/12/2016 chest radiograph FINDINGS: There is no evidence of dilated bowel loops or free intraperitoneal air. No radiopaque calculi or other significant radiographic abnormality is seen. Heart size and mediastinal contours are within normal limits. Both lungs are clear, interval resolution of right lung base opacity. Right upper quadrant surgical clips, presumably cholecystectomy. Degenerative changes of the symphysis pubis. IMPRESSION: Negative abdominal radiographs.  No acute cardiopulmonary disease. Electronically Signed   By: Kristine Garbe M.D.   On: 03/17/2016 19:18    Subjective: No complaints. Feels better  Discharge Exam: Vitals:   03/18/16 0453 03/18/16 1442  BP: (!) 91/51 (!) 98/58  Pulse: 91 87  Resp: 16 18  Temp: 98.2 F (36.8 C) 98.1 F (36.7 C)   Vitals:   03/17/16 2230 03/17/16 2333 03/18/16 0453 03/18/16 1442  BP: (!) 87/52 107/78 (!) 91/51 (!) 98/58   Pulse: 84 88 91 87  Resp:  18 16 18   Temp:   98.2 F (36.8 C) 98.1 F (36.7 C)  TempSrc:   Oral Oral  SpO2: 100% 99% 98% 97%  Weight:  52.2 kg (115 lb 1.3 oz)    Height:  5' (1.524 m)      General: Pt is alert, awake, not in acute distress Cardiovascular: RRR, S1/S2 +, no rubs, no gallops Respiratory: CTA bilaterally, no wheezing, no rhonchi Abdominal: Soft, NT, ND, bowel sounds + Extremities: no edema, no cyanosis   The results of significant diagnostics from this hospitalization (including imaging, microbiology, ancillary and laboratory) are listed below for reference.     Microbiology: Recent Results (from the past 240 hour(s))  Culture, blood (Routine X 2) w Reflex to ID Panel     Status: None   Collection Time: 03/12/16  3:20 PM  Result Value Ref Range Status   Specimen Description BLOOD BLOOD RIGHT ARM  Final   Special Requests BOTTLES DRAWN AEROBIC ONLY 5CC  Final   Culture NO GROWTH 5 DAYS  Final   Report Status 03/17/2016 FINAL  Final  Culture, blood (Routine X 2) w Reflex to ID Panel     Status: None   Collection Time: 03/12/16  3:25  PM  Result Value Ref Range Status   Specimen Description BLOOD BLOOD RIGHT HAND  Final   Special Requests BOTTLES DRAWN AEROBIC ONLY 4CC  Final   Culture NO GROWTH 5 DAYS  Final   Report Status 03/17/2016 FINAL  Final  C difficile quick scan w PCR reflex     Status: None   Collection Time: 03/14/16  8:46 PM  Result Value Ref Range Status   C Diff antigen NEGATIVE NEGATIVE Final   C Diff toxin NEGATIVE NEGATIVE Final   C Diff interpretation No C. difficile detected.  Final     Labs: BNP (last 3 results)  Recent Labs  03/17/16 1822  BNP [id number]*   Basic Metabolic Panel:  Recent Labs Lab 03/13/16 0336 03/13/16 1106 03/14/16 0506 03/15/16 0509 03/17/16 1822 03/18/16 0612  NA 138  --  136 135 132* 134*  K 2.8*  --  3.8 3.9 3.7 3.3*  CL 100*  --  105 108 103 108  CO2 28  --  21* 19* 22 20*  GLUCOSE 114*  --  102* 99  104* 92  BUN 9  --  8 11 12 9   CREATININE 0.77  --  0.86 0.87 0.91 0.76  CALCIUM 8.9  --  9.4 9.0 8.7* 8.0*  MG  --  2.1  --   --   --   --    Liver Function Tests: No results for input(s): AST, ALT, ALKPHOS, BILITOT, PROT, ALBUMIN in the last 168 hours. No results for input(s): LIPASE, AMYLASE in the last 168 hours. No results for input(s): AMMONIA in the last 168 hours. CBC:  Recent Labs Lab 03/14/16 0506 03/15/16 0509 03/17/16 1822 03/18/16 0612 03/18/16 1359  WBC 14.3* 12.2* 12.5* 9.9 10.4  NEUTROABS  --   --  9.0*  --   --   HGB 13.5 11.7* 11.0* 9.3* 9.2*  HCT 39.9 35.2* 32.3* 27.7* 27.5*  MCV 84.5 84.4 84.1 84.7 84.9  PLT 366 361 397 390 372   Cardiac Enzymes:  Recent Labs Lab 03/12/16 0017 03/12/16 0536 03/12/16 1258 03/17/16 1822 03/18/16 0612  TROPONINI >65.00* >65.00* >65.00* 5.51* 4.61*   BNP: Invalid input(s): POCBNP CBG: No results for input(s): GLUCAP in the last 168 hours. D-Dimer No results for input(s): DDIMER in the last 72 hours. Hgb A1c No results for input(s): HGBA1C in the last 72 hours. Lipid Profile No results for input(s): CHOL, HDL, LDLCALC, TRIG, CHOLHDL, LDLDIRECT in the last 72 hours. Thyroid function studies No results for input(s): TSH, T4TOTAL, T3FREE, THYROIDAB in the last 72 hours.  Invalid input(s): FREET3 Anemia work up  Recent Labs  03/18/16 0940  VITAMINB12 883  FOLATE 22.4  FERRITIN 356*  TIBC 252  IRON 10*  RETICCTPCT 0.7   Urinalysis    Component Value Date/Time   COLORURINE YELLOW 03/17/2016 2036   APPEARANCEUR CLEAR 03/17/2016 2036   LABSPEC >1.030 (H) 03/17/2016 2036   PHURINE 5.5 03/17/2016 2036   GLUCOSEU NEGATIVE 03/17/2016 2036   HGBUR NEGATIVE 03/17/2016 2036   BILIRUBINUR NEGATIVE 03/17/2016 2036   KETONESUR TRACE (A) 03/17/2016 2036   PROTEINUR NEGATIVE 03/17/2016 2036   NITRITE NEGATIVE 03/17/2016 2036   LEUKOCYTESUR NEGATIVE 03/17/2016 2036   Sepsis Labs Invalid input(s): PROCALCITONIN,   WBC,  LACTICIDVEN Microbiology Recent Results (from the past 240 hour(s))  Culture, blood (Routine X 2) w Reflex to ID Panel     Status: None   Collection Time: 03/12/16  3:20 PM  Result Value Ref Range  Status   Specimen Description BLOOD BLOOD RIGHT ARM  Final   Special Requests BOTTLES DRAWN AEROBIC ONLY 5CC  Final   Culture NO GROWTH 5 DAYS  Final   Report Status 03/17/2016 FINAL  Final  Culture, blood (Routine X 2) w Reflex to ID Panel     Status: None   Collection Time: 03/12/16  3:25 PM  Result Value Ref Range Status   Specimen Description BLOOD BLOOD RIGHT HAND  Final   Special Requests BOTTLES DRAWN AEROBIC ONLY 4CC  Final   Culture NO GROWTH 5 DAYS  Final   Report Status 03/17/2016 FINAL  Final  C difficile quick scan w PCR reflex     Status: None   Collection Time: 03/14/16  8:46 PM  Result Value Ref Range Status   C Diff antigen NEGATIVE NEGATIVE Final   C Diff toxin NEGATIVE NEGATIVE Final   C Diff interpretation No C. difficile detected.  Final     SIGNED:   Donne Hazel, MD  [facility] 03/18/2016, 3:32 PM  If 7PM-7AM, please contact night-coverage [email or web address] Password [email or web address]

## 2016-03-18 NOTE — Telephone Encounter (Signed)
Pt is in hospital.

## 2016-03-18 NOTE — Consult Note (Signed)
CARDIOLOGY CONSULT NOTE   Patient ID: Erin [name] MRN: [id number] DOB/AGE: 54-Jun-1962 54 y.o.  Admit Date: 03/17/2016 Referring Physician: Ulla Gallo, MD Primary Physician: Jonathon Bellows, MD Consulting Cardiologist:Emeri Estill, Nevin Bloodgood MD Primary Cardiologist Golden Hurter, MD Reason for Consultation: Syncope   Clinical Summary Erin [name] is a 55 y.o.female with known history of hypertension, GERD, hyperlipidemia, CAD with hx of NSTEMI and admission to [facility] hospital on 03/11/2016 with cardiac cath revealing prox-mid circumflex lesion 100% stenosed, with DES placed over previously placed stent on 11/0/07/2015 and subsequent stenting of RCA and Right PDA on 03/14/2016. She called our office on 03/17/2016 with complaints of inability to get a BP reading, feeling weak and if she were "walking in a tunnel." She was advised to come by the office for a BP check by the nurses. On discharge from the hospital on 03/15/2016, blood pressure was 98/64. Weight 112.  Discharge medications included dual antiplatelet therapy with Brilinta  and aspirin, metoprolol 25 mg twice a day, lisinopril 5 mg daily.   She presented to the ER instead with near syncope and hypotension.  On arrival to the emergency room blood pressure was 91/59, heart rate 83, O2 sat 99%, she was afebrile. Pertinent labs included sodium of 132, creatinine 0.91. Troponin 5.51,  (trending downward from prior troponin on 03/12/2016 greater than 65) BNP 221. Hemoglobin 11.0, hematocrit 32.3. Chest x-ray negative for CHF or pneumonia, or acute cardiopulmonary disease. She was treated with IV fluids and admitted for observation. She was taken off of lisinopril, and beta blocker on admission due to hypotension. She remains on dual antiplatelet therapy, has been placed on subcutaneous heparin.   No Known Allergies  Medications Scheduled Medications: . aspirin EC  81 mg Oral Daily  . atorvastatin  80 mg Oral q1800  . heparin  5,000 Units Subcutaneous Q8H    . potassium chloride  40 mEq Oral Once  . ticagrelor  90 mg Oral BID     Infusions: . sodium chloride 100 mL/hr at 03/17/16 2049     PRN Medications:  acetaminophen   Past Medical History:  Diagnosis Date  . Basal cell carcinoma of forehead    "burned off" (03/14/2016)  . Benign essential HTN 09/06/2015  . Coronary artery disease    a. 03/11/16 PCI wited DES-->Prox/Mid Cx, 03/14/16 PCI with DES x2-->RCA, EF 30-35%  . GERD (gastroesophageal reflux disease)   . GERD (gastroesophageal reflux disease) 09/06/2015  . Hyperlipidemia 09/06/2015  . Hypertension   . Myocardial infarction 03/10/2016  . Pneumonia 03/10/2016  . Squamous cell cancer of skin of nose    "burned off" (03/14/2016)  . Wears dentures    top  . Wears glasses     Past Surgical History:  Procedure Laterality Date  . APPENDECTOMY    . CARDIAC CATHETERIZATION N/A 03/11/2016   Procedure: Left Heart Cath and Coronary Angiography;  Surgeon: Leonie Man, MD;  Location: [facility];  Service: Cardiovascular;  Laterality: N/A;  . CARDIAC CATHETERIZATION N/A 03/11/2016   Procedure: Coronary Stent Intervention;  Surgeon: Leonie Man, MD;  Location: [facility];  Service: Cardiovascular;  Laterality: N/A;  . CARDIAC CATHETERIZATION N/A 03/14/2016   Procedure: Coronary Stent Intervention;  Surgeon: Peter M Martinique, MD;  Location: [facility];  Service: Cardiovascular;  Laterality: N/A;  . CHOLECYSTECTOMY OPEN  1984  . CORONARY ANGIOPLASTY WITH STENT PLACEMENT  03/14/2016  . FINGER ARTHROPLASTY Left 05/14/2013   Procedure: LEFT THUMB CARPAL METACARPAL ARTHROPLASTY;  Surgeon: Tennis Must, MD;  Location: [facility];  Service: Orthopedics;  Laterality: Left;  . TUBAL LIGATION  1987  . VAGINAL HYSTERECTOMY  2009    Family History  Problem Relation Age of Onset  . Stroke Mother   . Hypertension Mother   . Diabetes Mother   . Heart failure Other     Social History Erin [name]  reports that she has been smoking Cigarettes.  She has a 19.00 pack-year smoking history. She has never used smokeless tobacco. Erin [name] reports that she drinks about 3.6 oz of alcohol per week .  Review of Systems Complete review of systems are found to be negative unless outlined in H&P above.  Physical Examination Blood pressure (!) 91/51, pulse 91, temperature 98.2 F (36.8 C), temperature source Oral, resp. rate 16, height 5' (1.524 m), weight 115 lb 1.3 oz (52.2 kg), SpO2 98 %.  Intake/Output Summary (Last 24 hours) at 03/18/16 0753 Last data filed at 03/18/16 0600  Gross per 24 hour  Intake          1658.33 ml  Output                0 ml  Net          1658.33 ml    Telemetry:Sinus rhythm rates in the 70s and 80s at rest.  GEN: Ill-appearing in no acute distress HEENT: Conjunctiva and lids normal, oropharynx clear with moist mucosa. Neck: Supple, no elevated JVP or carotid bruits, no thyromegaly. Lungs: Bilateral rales and rhonchi with frequent coughing Cardiac: Regular rate and rhythm, no S3 or significant systolic murmur, no pericardial rub. Abdomen: Soft, nontender, no hepatomegaly, bowel sounds present, no guarding or rebound. Extremities: No pitting edema, distal pulses 2+. Skin: Warm and dry. Musculoskeletal: No kyphosis. Neuropsychiatric: Alert and oriented x3, affect grossly appropriate.  Prior Cardiac Testing/Procedures 1. Echocardiogram 03/13/2016 Left ventricle: The cavity size was normal. Wall thickness was   normal. Systolic function was moderately to severely reduced. The   estimated ejection fraction was in the range of 30% to 35%. There   is akinesis of the inferolateral, inferior, and inferoseptal   myocardium. Features are consistent with a pseudonormal left   ventricular filling pattern, with concomitant abnormal relaxation   and increased filling pressure (grade 2 diastolic dysfunction). - Mitral valve: There was mild regurgitation.  Cardiac Cath  03/11/2016  Prox-Mid Cx lesion, 100 %stenosed.  A STENT SYNERGY DES 3X12 drug eluting stent was successfully placed, and overlaps previously placed stent.  Post intervention, there is a 0% residual stenosis.  Dist RCA lesion, 80 %stenosed. - Would consider treatment in a staged fashion, could either be done during this hospitalization or as an outpatient.  The left ventricular ejection fraction is 45-50% by visual estimate.  There is severe (4+) mitral regurgitation.  LV end diastolic pressure is severely elevated.  Cardiac Cath with Staged PCI 03/14/2016  Mid Cx lesion, 0 %stenosed.  A drug eluting .  A STENT PROMUS PREM MR 2.25X16 drug eluting stent was successfully placed.  A STENT PROMUS PREM MR 2.25X8 drug-eluting stent was successfully placed.  Dist RCA lesion, 60 %stenosed.  Post intervention, there is a 0% residual stenosis.  Post Atrio lesion, 0 %stenosed.  Post intervention, there is a 0% residual stenosis.  A stent was successfully placed.   1. Successful Stenting of the distal RCA/PDA with DES x 2.  Lab Results  Basic Metabolic Panel:  Recent Labs Lab 03/13/16 0336 03/13/16 1106  03/14/16 0506 03/15/16 0509 03/17/16 1822 03/18/16 0612  NA 138  --  136 135 132* 134*  K 2.8*  --  3.8 3.9 3.7 3.3*  CL 100*  --  105 108 103 108  CO2 28  --  21* 19* 22 20*  GLUCOSE 114*  --  102* 99 104* 92  BUN 9  --  8 11 12 9   CREATININE 0.77  --  0.86 0.87 0.91 0.76  CALCIUM 8.9  --  9.4 9.0 8.7* 8.0*  MG  --  2.1  --   --   --   --     CBC:  Recent Labs Lab 03/13/16 0336 03/14/16 0506 03/15/16 0509 03/17/16 1822 03/18/16 0612  WBC 14.7* 14.3* 12.2* 12.5* 9.9  NEUTROABS  --   --   --  9.0*  --   HGB 12.6 13.5 11.7* 11.0* 9.3*  HCT 37.1 39.9 35.2* 32.3* 27.7*  MCV 83.7 84.5 84.4 84.1 84.7  PLT 349 366 361 397 390    Cardiac Enzymes:  Recent Labs Lab 03/12/16 0017 03/12/16 0536 03/12/16 1258 03/17/16 1822 03/18/16 0612  TROPONINI >65.00*  >65.00* >65.00* 5.51* 4.61*    Radiology: Dg Abd Acute W/chest  Result Date: 03/17/2016 CLINICAL DATA:  55 y/o F; weakness, dizziness, and low blood pressure with recent cardiac stent placement. EXAM: DG ABDOMEN ACUTE W/ 1V CHEST COMPARISON:  03/12/2016 chest radiograph FINDINGS: There is no evidence of dilated bowel loops or free intraperitoneal air. No radiopaque calculi or other significant radiographic abnormality is seen. Heart size and mediastinal contours are within normal limits. Both lungs are clear, interval resolution of right lung base opacity. Right upper quadrant surgical clips, presumably cholecystectomy. Degenerative changes of the symphysis pubis. IMPRESSION: Negative abdominal radiographs.  No acute cardiopulmonary disease. Electronically Signed   By: Kristine Garbe M.D.   On: 03/17/2016 19:18     ECG: Normal sinus rhythm with biatrial enlargement, no prominent Q waves.   Impression and Recommendations  1. Hypotension with near-syncope: Patient presented to the emergency room with generalized weakness and fatigue and hypotension with near syncope. She was found to be hypotensive on arrival to the emergency room and given IV fluids. She had been on metoprolol and lisinopril post hospitalization after non-ST elevation MI, and this was discontinued.  She offers no further complaints of dizziness or near syncope this a.m. assessment. We'll get orthostatic blood pressures.   2. CAD: Status post hospitalization for non-ST elevation MI, left heart cath on 03/11/2016 with drug-eluting stent to the proximal circumflex due to 100% occlusion, with subsequent staged PCI of the right coronary artery and right PDA with drug-eluting stents. Continue dual antiplatelet therapy with Brilinta and aspirin. Ideally should be on beta blocker and ACE inhibitor, but appears to be intolerant of this due to severe weakness fatigue and hypotension. Heart rate is in the 80s. Would benefit from  low-dose beta blocker for antianginal properties and better cardiac output, with better controlled heart rate at rest.  She was found to have elevated troponin level, but believe this is trending down from initial significantly elevated troponin on admission to [facility] one week ago in the setting of non-ST elevation MI. We'll continue to follow.  3. Ischemic cardiomypathy: Recent echocardiogram completed during hospitalization revealed significantly reduced LV systolic function from normal LV systolic function in August 2017, reduced EF of 30-35% with akinesis of the inferior lateral inferior and inferior septal myocardium. It is best that she have a low normal blood  pressure if she can tolerate it with this reduced LVEF. It appears that she is very symptomatic with the lower blood pressure. She will need to be on optimal medical therapy but instituted slowly. Will discuss with Dr. Harrington Challenger, about beginning low-dose carvedilol 3.125 mg twice a day.  4. Anemia: Noted after IV fluid infusion with hemoglobin dropping from 11.0-9.3 overnight. She denies any melena hemoptysis or frank bleeding. We'll check anemia profile.  5. Ongoing tobacco abuse: Significant in the setting of known coronary artery disease with recent stent placement. Counseling is provided. She is not inclined to quit at this time as she "has bad nerves", but realizes that she should.    Signed: Phill Myron. Lawrence NP AACC  03/18/2016, 7:53 AM Co-Sign MD  Pt seen and examined  I agree with findingas as noted above  Pt currently denies dizziness  Was walking halls earlier On exam:  LUngs rhonchi that clears with cough  Cardiac RRR  No S3  Ext without edema Labs signif for Hgb 9.3   Pt deneis changes in BM  Would cap IV fluids   Repeat CBC   ? Dilutional  Fe studies pending   If feels OK, Hgbn without large drop could d/c with close outpt f/u  Dorris Carnes

## 2016-03-18 NOTE — Progress Notes (Signed)
Discharge instructions and prescriptions given, verbalized understanding, out in stable condition via w/c with staff. 

## 2016-03-19 LAB — URINE CULTURE: Culture: NO GROWTH

## 2016-03-24 ENCOUNTER — Encounter (HOSPITAL_COMMUNITY): Payer: Self-pay | Admitting: Emergency Medicine

## 2016-03-24 ENCOUNTER — Emergency Department (HOSPITAL_COMMUNITY): Payer: PRIVATE HEALTH INSURANCE

## 2016-03-24 ENCOUNTER — Observation Stay (HOSPITAL_COMMUNITY)
Admission: EM | Admit: 2016-03-24 | Discharge: 2016-03-26 | Disposition: A | Discharge status: Home / Self Care | Payer: PRIVATE HEALTH INSURANCE | Attending: Internal Medicine | Admitting: Internal Medicine

## 2016-03-24 ENCOUNTER — Telehealth: Payer: Self-pay | Admitting: *Deleted

## 2016-03-24 DIAGNOSIS — Z79899 Other long term (current) drug therapy: Secondary | ICD-10-CM | POA: Insufficient documentation

## 2016-03-24 DIAGNOSIS — E785 Hyperlipidemia, unspecified: Secondary | ICD-10-CM | POA: Diagnosis present

## 2016-03-24 DIAGNOSIS — E782 Mixed hyperlipidemia: Secondary | ICD-10-CM

## 2016-03-24 DIAGNOSIS — I255 Ischemic cardiomyopathy: Secondary | ICD-10-CM | POA: Diagnosis not present

## 2016-03-24 DIAGNOSIS — I10 Essential (primary) hypertension: Secondary | ICD-10-CM | POA: Diagnosis not present

## 2016-03-24 DIAGNOSIS — D75839 Thrombocytosis, unspecified: Secondary | ICD-10-CM | POA: Diagnosis present

## 2016-03-24 DIAGNOSIS — I25119 Atherosclerotic heart disease of native coronary artery with unspecified angina pectoris: Secondary | ICD-10-CM | POA: Diagnosis not present

## 2016-03-24 DIAGNOSIS — I2510 Atherosclerotic heart disease of native coronary artery without angina pectoris: Secondary | ICD-10-CM | POA: Diagnosis present

## 2016-03-24 DIAGNOSIS — Z85828 Personal history of other malignant neoplasm of skin: Secondary | ICD-10-CM | POA: Insufficient documentation

## 2016-03-24 DIAGNOSIS — R0602 Shortness of breath: Principal | ICD-10-CM | POA: Diagnosis present

## 2016-03-24 DIAGNOSIS — D649 Anemia, unspecified: Secondary | ICD-10-CM | POA: Diagnosis present

## 2016-03-24 DIAGNOSIS — Z955 Presence of coronary angioplasty implant and graft: Secondary | ICD-10-CM | POA: Insufficient documentation

## 2016-03-24 DIAGNOSIS — F1290 Cannabis use, unspecified, uncomplicated: Secondary | ICD-10-CM | POA: Insufficient documentation

## 2016-03-24 DIAGNOSIS — R7989 Other specified abnormal findings of blood chemistry: Secondary | ICD-10-CM | POA: Diagnosis present

## 2016-03-24 DIAGNOSIS — E784 Other hyperlipidemia: Secondary | ICD-10-CM

## 2016-03-24 DIAGNOSIS — I110 Hypertensive heart disease with heart failure: Secondary | ICD-10-CM | POA: Insufficient documentation

## 2016-03-24 DIAGNOSIS — I5042 Chronic combined systolic (congestive) and diastolic (congestive) heart failure: Secondary | ICD-10-CM | POA: Diagnosis present

## 2016-03-24 DIAGNOSIS — Z87891 Personal history of nicotine dependence: Secondary | ICD-10-CM | POA: Diagnosis not present

## 2016-03-24 DIAGNOSIS — F411 Generalized anxiety disorder: Secondary | ICD-10-CM | POA: Diagnosis present

## 2016-03-24 DIAGNOSIS — D473 Essential (hemorrhagic) thrombocythemia: Secondary | ICD-10-CM | POA: Diagnosis present

## 2016-03-24 DIAGNOSIS — R778 Other specified abnormalities of plasma proteins: Secondary | ICD-10-CM | POA: Diagnosis present

## 2016-03-24 HISTORY — DX: Nonrheumatic mitral (valve) insufficiency: I34.0

## 2016-03-24 HISTORY — DX: Ischemic cardiomyopathy: I25.5

## 2016-03-24 HISTORY — DX: Essential (hemorrhagic) thrombocythemia: D47.3

## 2016-03-24 HISTORY — DX: Non-ST elevation (NSTEMI) myocardial infarction: I21.4

## 2016-03-24 HISTORY — DX: Essential (primary) hypertension: I10

## 2016-03-24 HISTORY — DX: Generalized anxiety disorder: F41.1

## 2016-03-24 HISTORY — DX: Personal history of pneumonia (recurrent): Z87.01

## 2016-03-24 LAB — COMPREHENSIVE METABOLIC PANEL
ALT: 18 U/L (ref 14–54)
AST: 23 U/L (ref 15–41)
Albumin: 3.3 g/dL — ABNORMAL LOW (ref 3.5–5.0)
Alkaline Phosphatase: 71 U/L (ref 38–126)
Anion gap: 10 (ref 5–15)
BUN: 10 mg/dL (ref 6–20)
CO2: 22 mmol/L (ref 22–32)
Calcium: 9.6 mg/dL (ref 8.9–10.3)
Chloride: 106 mmol/L (ref 101–111)
Creatinine, Ser: 0.71 mg/dL (ref 0.44–1.00)
GFR calc Af Amer: >60 mL/min (ref >60)
GFR calc non Af Amer: >60 mL/min (ref >60)
Glucose, Bld: 88 mg/dL (ref 65–99)
Potassium: 3.6 mmol/L (ref 3.5–5.1)
Sodium: 138 mmol/L (ref 135–145)
Total Bilirubin: 0.4 mg/dL (ref 0.3–1.2)
Total Protein: 7 g/dL (ref 6.5–8.1)

## 2016-03-24 LAB — I-STAT TROPONIN, ED: Troponin i, poc: 0.12 ng/mL (ref 0.00–0.08)

## 2016-03-24 LAB — CREATININE, SERUM
Creatinine, Ser: 0.69 mg/dL (ref 0.44–1.00)
GFR calc Af Amer: >60 mL/min (ref >60)
GFR calc non Af Amer: >60 mL/min (ref >60)

## 2016-03-24 LAB — CBC WITH DIFFERENTIAL/PLATELET
Basophils Absolute: 0 10*3/uL (ref 0.0–0.1)
Basophils Relative: 0 %
Eosinophils Absolute: 0.1 10*3/uL (ref 0.0–0.7)
Eosinophils Relative: 1 %
HCT: 29.2 % — ABNORMAL LOW (ref 36.0–46.0)
Hemoglobin: 9.6 g/dL — ABNORMAL LOW (ref 12.0–15.0)
Lymphocytes Relative: 17 %
Lymphs Abs: 1.9 10*3/uL (ref 0.7–4.0)
MCH: 28 pg (ref 26.0–34.0)
MCHC: 32.9 g/dL (ref 30.0–36.0)
MCV: 85.1 fL (ref 78.0–100.0)
Monocytes Absolute: 0.7 10*3/uL (ref 0.1–1.0)
Monocytes Relative: 6 %
Neutro Abs: 8.4 10*3/uL — ABNORMAL HIGH (ref 1.7–7.7)
Neutrophils Relative %: 76 %
Platelets: 670 10*3/uL — ABNORMAL HIGH (ref 150–400)
RBC: 3.43 MIL/uL — ABNORMAL LOW (ref 3.87–5.11)
RDW: 13.7 % (ref 11.5–15.5)
WBC: 11.1 10*3/uL — ABNORMAL HIGH (ref 4.0–10.5)

## 2016-03-24 LAB — CBC
HCT: 28.3 % — ABNORMAL LOW (ref 36.0–46.0)
Hemoglobin: 9.3 g/dL — ABNORMAL LOW (ref 12.0–15.0)
MCH: 27.8 pg (ref 26.0–34.0)
MCHC: 32.9 g/dL (ref 30.0–36.0)
MCV: 84.7 fL (ref 78.0–100.0)
Platelets: 604 10*3/uL — ABNORMAL HIGH (ref 150–400)
RBC: 3.34 MIL/uL — ABNORMAL LOW (ref 3.87–5.11)
RDW: 13.6 % (ref 11.5–15.5)
WBC: 9.4 10*3/uL (ref 4.0–10.5)

## 2016-03-24 LAB — TSH: TSH: 0.366 u[IU]/mL (ref 0.350–4.500)

## 2016-03-24 LAB — TROPONIN I: Troponin I: 0.14 ng/mL (ref <0.03)

## 2016-03-24 LAB — D-DIMER, QUANTITATIVE: D-Dimer, Quant: 0.54 ug/mL-FEU — ABNORMAL HIGH (ref 0.00–0.50)

## 2016-03-24 MED ORDER — LORAZEPAM 2 MG/ML IJ SOLN
1.0000 mg | INTRAMUSCULAR | Status: DC | PRN
  Administered 2016-03-24 – 2016-03-25 (×2): 1 mg via INTRAVENOUS
  Filled 2016-03-24 (×2): qty 1

## 2016-03-24 MED ORDER — ACETAMINOPHEN 650 MG RE SUPP: 650.0000 mg | Freq: Four times a day (QID) | RECTAL | Status: DC | PRN

## 2016-03-24 MED ORDER — ACETAMINOPHEN 325 MG PO TABS
650.0000 mg | ORAL_TABLET | Freq: Four times a day (QID) | ORAL | Status: DC | PRN
  Administered 2016-03-24: 650 mg via ORAL
  Filled 2016-03-24 (×2): qty 2

## 2016-03-24 MED ORDER — CARVEDILOL 3.125 MG PO TABS
3.1250 mg | ORAL_TABLET | Freq: Two times a day (BID) | ORAL | Status: DC
  Administered 2016-03-24 – 2016-03-26 (×4): 3.125 mg via ORAL
  Filled 2016-03-24 (×5): qty 1

## 2016-03-24 MED ORDER — ACETAMINOPHEN 500 MG PO TABS: 500.0000 mg | ORAL_TABLET | Freq: Three times a day (TID) | ORAL | Status: DC | PRN

## 2016-03-24 MED ORDER — TICAGRELOR 90 MG PO TABS
90.0000 mg | ORAL_TABLET | Freq: Two times a day (BID) | ORAL | Status: DC
  Administered 2016-03-24 – 2016-03-26 (×4): 90 mg via ORAL
  Filled 2016-03-24 (×8): qty 1

## 2016-03-24 MED ORDER — ASPIRIN EC 81 MG PO TBEC
81.0000 mg | DELAYED_RELEASE_TABLET | Freq: Every day | ORAL | Status: DC
  Administered 2016-03-24 – 2016-03-26 (×3): 81 mg via ORAL
  Filled 2016-03-24 (×3): qty 1

## 2016-03-24 MED ORDER — ATORVASTATIN CALCIUM 40 MG PO TABS
80.0000 mg | ORAL_TABLET | Freq: Every day | ORAL | Status: DC
  Administered 2016-03-24 – 2016-03-25 (×2): 80 mg via ORAL
  Filled 2016-03-24 (×2): qty 2

## 2016-03-24 MED ORDER — SODIUM CHLORIDE 0.9% FLUSH
3.0000 mL | Freq: Two times a day (BID) | INTRAVENOUS | Status: DC
  Administered 2016-03-24 – 2016-03-26 (×3): 3 mL via INTRAVENOUS

## 2016-03-24 MED ORDER — HEPARIN SODIUM (PORCINE) 5000 UNIT/ML IJ SOLN
5000.0000 [IU] | Freq: Three times a day (TID) | INTRAMUSCULAR | Status: DC
  Administered 2016-03-24 – 2016-03-25 (×4): 5000 [IU] via SUBCUTANEOUS
  Filled 2016-03-24 (×4): qty 1

## 2016-03-24 NOTE — ED Notes (Signed)
Lucretia, RT attempted art stick without success.  Pt would not let her attempt again, Dr. Roderic Palau notified.

## 2016-03-24 NOTE — Telephone Encounter (Signed)
Noted! Thank you

## 2016-03-24 NOTE — ED Provider Notes (Signed)
[facility] DEPT Provider Note   CSN: [id number] Arrival date & time: 03/24/16  1107     History   Chief Complaint Chief Complaint  Patient presents with  . Shortness of Breath    HPI Erin [name] is a 55 y.o. female.  Patient complains of dyspnea whenever she gets up and walks around the last couple days. She had a non-STEMI MI couple weeks ago   The history is provided by the patient. No language interpreter was used.  Shortness of Breath  This is a new problem. The problem occurs frequently.The current episode started 2 days ago. The problem has not changed since onset.Pertinent negatives include no fever, no headaches, no cough, no chest pain, no abdominal pain and no rash. The problem's precipitants include exercise. Risk factors include smoking. She has tried nothing for the symptoms. The treatment provided no relief.    Past Medical History:  Diagnosis Date  . Basal cell carcinoma of forehead   . Coronary artery disease    a. 03/11/16 PCI with DES-->Prox/Mid Cx, 03/14/16 PCI with DES x2-->RCA, EF 30-35%  . Essential hypertension   . GERD (gastroesophageal reflux disease)   . History of pneumonia   . Hyperlipidemia   . Ischemic cardiomyopathy   . Mitral regurgitation   . NSTEMI (non-ST elevated myocardial infarction) ([place]) 03/10/2016  . Squamous cell cancer of skin of nose   . Wears dentures   . Wears glasses     Patient Active Problem List   Diagnosis Date Noted  . Lightheadedness 03/17/2016  . Hypotension 03/17/2016  . Tobacco abuse 03/12/2016  . NSTEMI (non-ST elevated myocardial infarction) ([place]) 03/11/2016  . Pain in the chest   . Benign essential HTN 09/06/2015  . Hyperlipidemia 09/06/2015  . GERD (gastroesophageal reflux disease) 09/06/2015  . Chest pain 09/06/2015    Past Surgical History:  Procedure Laterality Date  . APPENDECTOMY    . CARDIAC CATHETERIZATION N/A 03/11/2016   Procedure: Left Heart Cath and Coronary Angiography;  Surgeon:  Leonie Man, MD;  Location: [facility];  Service: Cardiovascular;  Laterality: N/A;  . CARDIAC CATHETERIZATION N/A 03/11/2016   Procedure: Coronary Stent Intervention;  Surgeon: Leonie Man, MD;  Location: [facility];  Service: Cardiovascular;  Laterality: N/A;  . CARDIAC CATHETERIZATION N/A 03/14/2016   Procedure: Coronary Stent Intervention;  Surgeon: Peter M Martinique, MD;  Location: [facility];  Service: Cardiovascular;  Laterality: N/A;  . CHOLECYSTECTOMY OPEN  1984  . CORONARY ANGIOPLASTY WITH STENT PLACEMENT  03/14/2016  . FINGER ARTHROPLASTY Left 05/14/2013   Procedure: LEFT THUMB CARPAL METACARPAL ARTHROPLASTY;  Surgeon: Tennis Must, MD;  Location: [facility];  Service: Orthopedics;  Laterality: Left;  . TUBAL LIGATION  1987  . VAGINAL HYSTERECTOMY  2009    OB History    Gravida Para Term Preterm AB Living   4 4 4     4    SAB TAB Ectopic Multiple Live Births                   Home Medications    Prior to Admission medications   Medication Sig Start Date End Date Taking? Authorizing Provider  acetaminophen (TYLENOL) 500 MG tablet Take 1 tablet (500 mg total) by mouth every 8 (eight) hours as needed for mild pain, moderate pain or headache. 03/15/16  Yes Cheryln Manly, NP  aspirin EC 81 MG EC tablet Take 1 tablet (81 mg total) by mouth daily. 03/19/16  Yes Donne Hazel, MD  atorvastatin (LIPITOR) 80 MG tablet Take 1 tablet (80 mg total) by mouth daily at 6 PM. 03/15/16  Yes Cheryln Manly, NP  nitroGLYCERIN (NITROSTAT) 0.4 MG SL tablet Place 1 tablet (0.4 mg total) under the tongue every 5 (five) minutes x 3 doses as needed for chest pain. 09/06/15  Yes Almyra Deforest, PA  ticagrelor (BRILINTA) 90 MG TABS tablet Take 1 tablet (90 mg total) by mouth 2 (two) times daily. 03/18/16  Yes Donne Hazel, MD  AMBULATORY NON FORMULARY MEDICATION Take 90 mg by mouth 2 (two) times daily. Medication Name: BRILINTA 90 mg BID (TWILIGHT Research study  PROVIDED) 03/15/16   Burnell Blanks, MD  AMBULATORY NON FORMULARY MEDICATION Take 81 mg by mouth daily. Medication Name: Aspirin 81 mg daily (TWILIGHT Research Study PROVIDED) 03/15/16   Burnell Blanks, MD  levofloxacin (LEVAQUIN) 500 MG tablet Take 1 tablet (500 mg total) by mouth daily. Patient not taking: Reported on 03/24/2016 03/15/16   Cheryln Manly, NP    Family History Family History  Problem Relation Age of Onset  . Stroke Mother   . Hypertension Mother   . Diabetes Mother   . Heart failure Other     Social History Social History  Substance Use Topics  . Smoking status: Former Smoker    Packs/day: 1.00    Years: 38.00    Types: Cigarettes  . Smokeless tobacco: Never Used     Comment: quit 2 weeks ago-03/24/16  . Alcohol use 3.6 oz/week    6 Cans of beer per week     Comment: occ     Allergies   Patient has no known allergies.   Review of Systems Review of Systems  Constitutional: Negative for appetite change, fatigue and fever.  HENT: Negative for congestion, ear discharge and sinus pressure.   Eyes: Negative for discharge.  Respiratory: Positive for shortness of breath. Negative for cough.   Cardiovascular: Negative for chest pain.  Gastrointestinal: Negative for abdominal pain and diarrhea.  Genitourinary: Negative for frequency and hematuria.  Musculoskeletal: Negative for back pain.  Skin: Negative for rash.  Neurological: Negative for seizures and headaches.  Psychiatric/Behavioral: Negative for hallucinations.     Physical Exam Updated Vital Signs BP (!) 127/102   Pulse 81   Temp 97.8 F (36.6 C) (Oral)   Resp 23   Ht 5' (1.524 m)   Wt 115 lb (52.2 kg)   SpO2 100%   BMI 22.46 kg/m   Physical Exam  Constitutional: She is oriented to person, place, and time. She appears well-developed.  HENT:  Head: Normocephalic.  Eyes: Conjunctivae and EOM are normal. No scleral icterus.  Neck: Neck supple. No thyromegaly present.    Cardiovascular: Normal rate and regular rhythm.  Exam reveals no gallop and no friction rub.   No murmur heard. Pulmonary/Chest: No stridor. She has no wheezes. She has no rales. She exhibits no tenderness.  Abdominal: She exhibits no distension. There is no tenderness. There is no rebound.  Musculoskeletal: Normal range of motion. She exhibits no edema.  Lymphadenopathy:    She has no cervical adenopathy.  Neurological: She is oriented to person, place, and time. She exhibits normal muscle tone. Coordination normal.  Skin: No rash noted. No erythema.  Psychiatric: She has a normal mood and affect. Her behavior is normal.     ED Treatments / Results  Labs (all labs ordered are listed, but only abnormal results are displayed) Labs  Reviewed  CBC WITH DIFFERENTIAL/PLATELET - Abnormal; Notable for the following:       Result Value   WBC 11.1 (*)    RBC 3.43 (*)    Hemoglobin 9.6 (*)    HCT 29.2 (*)    Platelets 670 (*)    Neutro Abs 8.4 (*)    All other components within normal limits  COMPREHENSIVE METABOLIC PANEL - Abnormal; Notable for the following:    Albumin 3.3 (*)    All other components within normal limits  D-DIMER, QUANTITATIVE (NOT AT [facility]) - Abnormal; Notable for the following:    D-Dimer, Quant 0.54 (*)    All other components within normal limits  I-STAT TROPOININ, ED - Abnormal; Notable for the following:    Troponin i, poc 0.12 (*)    All other components within normal limits  BLOOD GAS, ARTERIAL    EKG  EKG Interpretation  Date/Time:  Thursday March 24 2016 11:29:47 EST Ventricular Rate:  64 PR Interval:    QRS Duration: 70 QT Interval:  429 QTC Calculation: 443 R Axis:   41 Text Interpretation:  Sinus rhythm Low voltage, extremity leads Confirmed by Kynslie Ringle  MD, Kavi Almquist [phone]) on 03/24/2016 2:09:07 PM       Radiology Dg Chest 2 View  Result Date: 03/24/2016 CLINICAL DATA:  Patient with shortness of breath. Prior myocardial infarction. EXAM:  CHEST  2 VIEW COMPARISON:  Chest radiograph 03/17/2016. FINDINGS: Monitoring leads overlie the patient. Normal cardiac and mediastinal contours. No consolidative pulmonary opacities. No pleural effusion or pneumothorax. Thoracic spine degenerative changes. IMPRESSION: No active cardiopulmonary disease. Electronically Signed   By: Lovey Newcomer M.D.   On: 03/24/2016 13:18    Procedures Procedures (including critical care time)  Medications Ordered in ED Medications - No data to display   Initial Impression / Assessment and Plan / ED Course  I have reviewed the triage vital signs and the nursing notes.  Pertinent labs & imaging results that were available during my care of the patient were reviewed by me and considered in my medical decision making (see chart for details).  Clinical Course   Patient with elevated troponin and dyspnea on exertion. She was seen by cardiology and it was felt the patient should be admitted and have her troponins cycled    Final Clinical Impressions(s) / ED Diagnoses   Final diagnoses:  SOB (shortness of breath)    New Prescriptions New Prescriptions   No medications on file     Milton Ferguson, MD 03/24/16 1541

## 2016-03-24 NOTE — Telephone Encounter (Signed)
Patient called and reports SOB and dizziness. Onset is 0230 this am. Pt denies chest pain or pressure, nausea, and sweating at this time. Patient can be heard audibly taking deep breaths in phone. Pt states " I just can't get my breath, and my coworkers say I look pale". Patient encourged to be evaluated in the ED. Pt agreed and states she will come to [facility].

## 2016-03-24 NOTE — Consult Note (Signed)
Requesting provider: Dr. Raynelle Chary Primary cardiologist:  Dr. Fransico Him Consulting cardiologist: Dr. Satira Sark  Reason for consultation: Shortness of breath  Clinical Summary Erin [name] is a 55 y.o.female with past medical history outlined below, presenting to the ER today stating that she has been short of breath. She reports a feeling of anxiety in the evenings before she goes to bed, recalling her heart attack from earlier this month. Usually she is able to "talk herself out of it" and calm down, however last night she could not get relaxed with increasing feeling of shortness of breath and orthopnea. She does not report having any chest tightness however, no diaphoresis, nausea or emesis. She states that she has been taking her medications, at this point including aspirin and Brilinta. During her most recent hospital stay she was taken off both beta blocker and ACE inhibitor given relative hypotension, also received IV fluids. She does have an ischemic myopathy at baseline with LVEF 30-35% and grade 2 diastolic dysfunction.  ECG today does show new inferolateral ST-T wave abnormalities compared to her most recent tracing. Troponin I level is 0.12, however the most recent level on November 10 was still up at 4.6, trending down from her recent infarct. She has had some elevation in diastolic blood pressure in the ER.  No Known Allergies  Home Medications No current facility-administered medications on file prior to encounter.    Current Outpatient Prescriptions on File Prior to Encounter  Medication Sig Dispense Refill  . acetaminophen (TYLENOL) 500 MG tablet Take 1 tablet (500 mg total) by mouth every 8 (eight) hours as needed for mild pain, moderate pain or headache. 30 tablet 0  . aspirin EC 81 MG EC tablet Take 1 tablet (81 mg total) by mouth daily. 30 tablet 0  . atorvastatin (LIPITOR) 80 MG tablet Take 1 tablet (80 mg total) by mouth daily at 6 PM. 30 tablet 6  .  nitroGLYCERIN (NITROSTAT) 0.4 MG SL tablet Place 1 tablet (0.4 mg total) under the tongue every 5 (five) minutes x 3 doses as needed for chest pain. 25 tablet 3  . ticagrelor (BRILINTA) 90 MG TABS tablet Take 1 tablet (90 mg total) by mouth 2 (two) times daily. 60 tablet 0  . AMBULATORY NON FORMULARY MEDICATION Take 90 mg by mouth 2 (two) times daily. Medication Name: BRILINTA 90 mg BID (TWILIGHT Research study PROVIDED)    . AMBULATORY NON FORMULARY MEDICATION Take 81 mg by mouth daily. Medication Name: Aspirin 81 mg daily (TWILIGHT Research Study PROVIDED)    . levofloxacin (LEVAQUIN) 500 MG tablet Take 1 tablet (500 mg total) by mouth daily. (Patient not taking: Reported on 03/24/2016) 3 tablet 0    Past Medical History:  Diagnosis Date  . Basal cell carcinoma of forehead   . Coronary artery disease    a. 03/11/16 PCI with DES-->Prox/Mid Cx, 03/14/16 PCI with DES x2-->RCA, EF 30-35%  . Essential hypertension   . GERD (gastroesophageal reflux disease)   . History of pneumonia   . Hyperlipidemia   . Ischemic cardiomyopathy   . Mitral regurgitation   . NSTEMI (non-ST elevated myocardial infarction) ([place]) 03/10/2016  . Squamous cell cancer of skin of nose   . Wears dentures   . Wears glasses     Past Surgical History:  Procedure Laterality Date  . APPENDECTOMY    . CARDIAC CATHETERIZATION N/A 03/11/2016   Procedure: Left Heart Cath and Coronary Angiography;  Surgeon: Leonie Man, MD;  Location: [facility];  Service: Cardiovascular;  Laterality: N/A;  . CARDIAC CATHETERIZATION N/A 03/11/2016   Procedure: Coronary Stent Intervention;  Surgeon: Leonie Man, MD;  Location: [facility];  Service: Cardiovascular;  Laterality: N/A;  . CARDIAC CATHETERIZATION N/A 03/14/2016   Procedure: Coronary Stent Intervention;  Surgeon: Peter M Martinique, MD;  Location: [facility];  Service: Cardiovascular;  Laterality: N/A;  . CHOLECYSTECTOMY OPEN  1984  . CORONARY ANGIOPLASTY  WITH STENT PLACEMENT  03/14/2016  . FINGER ARTHROPLASTY Left 05/14/2013   Procedure: LEFT THUMB CARPAL METACARPAL ARTHROPLASTY;  Surgeon: Tennis Must, MD;  Location: [facility];  Service: Orthopedics;  Laterality: Left;  . TUBAL LIGATION  1987  . VAGINAL HYSTERECTOMY  2009    Family History  Problem Relation Age of Onset  . Stroke Mother   . Hypertension Mother   . Diabetes Mother   . Heart failure Other     Social History Erin [name] reports that she has quit smoking. Her smoking use included Cigarettes. She has a 38.00 pack-year smoking history. She has never used smokeless tobacco. Erin [name] reports that she drinks about 3.6 oz of alcohol per week .  Review of Systems Complete review of systems negative except as otherwise outlined in the clinical summary and also the following. Intermittent feelings of anxiety. No palpitations, no syncope, no reported bleeding problems.  Physical Examination Blood pressure 135/89, pulse 81, temperature 97.8 F (36.6 C), temperature source Oral, resp. rate 25, height 5' (1.524 m), weight 115 lb (52.2 kg), SpO2 100 %. No intake or output data in the 24 hours ending 03/24/16 1634  Telemetry: Sinus rhythm.  Gen.: Patient appears comfortable at rest. HEENT: Conjunctiva and lids normal, oropharynx clear. Neck: Supple, no elevated JVP or carotid bruits, no thyromegaly. Lungs: Clear to auscultation, nonlabored breathing at rest. Cardiac: Regular rate and rhythm, no S3 or significant systolic murmur, no pericardial rub. Abdomen: Soft, nontender, bowel sounds present. Extremities: No pitting edema, distal pulses 2+. Skin: Warm and dry. Musculoskeletal: No kyphosis. Neuropsychiatric: Alert and oriented x3, affect grossly appropriate.  Lab Results  Basic Metabolic Panel:  Recent Labs Lab 03/17/16 1822 03/18/16 0612 03/24/16 1227  NA 132* 134* 138  K 3.7 3.3* 3.6  CL 103 108 106  CO2 22 20* 22  GLUCOSE 104* 92 88  BUN 12 9 10     CREATININE 0.91 0.76 0.71  CALCIUM 8.7* 8.0* 9.6    Liver Function Tests:  Recent Labs Lab 03/24/16 1227  AST 23  ALT 18  ALKPHOS 71  BILITOT 0.4  PROT 7.0  ALBUMIN 3.3*    CBC:  Recent Labs Lab 03/17/16 1822 03/18/16 0612 03/18/16 1359 03/24/16 1227  WBC 12.5* 9.9 10.4 11.1*  NEUTROABS 9.0*  --   --  8.4*  HGB 11.0* 9.3* 9.2* 9.6*  HCT 32.3* 27.7* 27.5* 29.2*  MCV 84.1 84.7 84.9 85.1  PLT 397 390 372 670*    Cardiac Enzymes:  Recent Labs Lab 03/17/16 1822 03/18/16 0612  TROPONINI 5.51* 4.61*    Imaging  Echocardiogram 03/13/2016: Study Conclusions  - Left ventricle: The cavity size was normal. Wall thickness was   normal. Systolic function was moderately to severely reduced. The   estimated ejection fraction was in the range of 30% to 35%. There   is akinesis of the inferolateral, inferior, and inferoseptal   myocardium. Features are consistent with a pseudonormal left   ventricular filling pattern, with concomitant abnormal relaxation  and increased filling pressure (grade 2 diastolic dysfunction). - Mitral valve: There was mild regurgitation.  Impressions:  - When compared to prior, inferior lateral MI is new (EF was 55%,   now reduced)  Chest x-ray 03/24/2016: FINDINGS: Monitoring leads overlie the patient. Normal cardiac and mediastinal contours. No consolidative pulmonary opacities. No pleural effusion or pneumothorax. Thoracic spine degenerative changes.  IMPRESSION: No active cardiopulmonary disease.  Impression  1. Presentation with shortness of breath and orthopnea, also associated with feeling of anxiety. She does not report chest tightness which had been one of her presenting symptoms with original infarct earlier this month. She reports compliance with DAPT, however has been off both beta blocker and ACE inhibitor since most recent hospital discharge. With known ischemic cardiomyopathy and LVEF 30-35%, she could be having  symptoms more related to combined heart failure or increased LVEDP rather than recurrent ACS.  2. CAD status post NSTEMI in early November with placement of DES to the circumflex followed 3 days later with DES to the RCA.  3. History of tobacco abuse, patient quit smoking recently.  4. Hyperlipidemia, on Lipitor.  5. Mitral regurgitation, mild by most recent echocardiogram.  6. Anemia, hemoglobin 9.6. Does not report any obvious bleeding. Would plan to guaiac stools.  Recommendations  Patient being admitted to the hospitalist service for further observation. Cycle cardiac markers. If trend increases more consistent with recurrent ACS, would initiate heparin and we will ultimately plan transfer to [facility] for cardiac catheterization. If on the other hand cardiac markers remain low level and continue to decrease consistent with down trend from recent infarct, we will plan to manage medically with cautious reintroduction of beta blocker and likely low-dose ARB in light of cardiomyopathy. Chest x-ray does not show any pulmonary edema at this time. May ultimately go with a low dose oral diuretic as well.  Satira Sark, M.D., F.A.C.C.

## 2016-03-24 NOTE — H&P (Signed)
History and Physical    Erin [name] [id number] DOB: 1960/09/19 DOA: 03/24/2016  PCP: Jonathon Bellows, MD  Patient coming from: Home   Chief Complaint:  SOB.  HPI: Erin [name] is a 55 y.o. female with medical history significant for GERD, HTN, HLD, Nose cancer, CAD 08/2015 with PCI, and a NSTEMI early Nov 2017, presents to the ER with SOB.   She has no chest pain, pleuritic or otherwise, nausea, vomiting diaphoresis, fever, chills or coughs.  She felt that it was due to anxiety, as she has had feeling of anxiety since the heart attack.  Work up in the ER showed EKG without any acute ST T changes,  Troponin 0.12, trending down from a level of 4.6 on Nov 10, CXR was clear. Her D Dimer was 0.54.  Cardiology was consulted and hospitalist was asked to admit her for further evaluation.   ED Course:  See above.  Rewiew of Systems:  Constitutional: Negative for malaise, fever and chills. No significant weight loss or weight gain Eyes: Negative for eye pain, redness and discharge, diplopia, visual changes, or flashes of light. ENMT: Negative for ear pain, hoarseness, nasal congestion, sinus pressure and sore throat. No headaches; tinnitus, drooling, or problem swallowing. Cardiovascular: Negative for chest pain, palpitations, diaphoresis,  and peripheral edema. ; No orthopnea, PND Respiratory: Negative for cough, hemoptysis, wheezing and stridor. No pleuritic chestpain. Gastrointestinal: Negative for diarrhea, constipation,  melena, blood in stool, hematemesis, jaundice and rectal bleeding.    Genitourinary: Negative for frequency, dysuria, incontinence,flank pain and hematuria; Musculoskeletal: Negative for back pain and neck pain. Negative for swelling and trauma.;  Skin: . Negative for pruritus, rash, abrasions, bruising and skin lesion.; ulcerations Neuro: Negative for headache, lightheadedness and neck stiffness. Negative for weakness, altered level of consciousness , altered mental status,  extremity weakness, burning feet, involuntary movement, seizure and syncope.  Psych: negative for anxiety, depression, insomnia, tearfulness, panic attacks, hallucinations, paranoia, suicidal or homicidal ideation    Past Medical History:  Diagnosis Date  . Basal cell carcinoma of forehead   . Coronary artery disease    a. 03/11/16 PCI with DES-->Prox/Mid Cx, 03/14/16 PCI with DES x2-->RCA, EF 30-35%  . Essential hypertension   . GERD (gastroesophageal reflux disease)   . History of pneumonia   . Hyperlipidemia   . Ischemic cardiomyopathy   . Mitral regurgitation   . NSTEMI (non-ST elevated myocardial infarction) ([place]) 03/10/2016  . Squamous cell cancer of skin of nose   . Wears dentures   . Wears glasses     Past Surgical History:  Procedure Laterality Date  . APPENDECTOMY    . CARDIAC CATHETERIZATION N/A 03/11/2016   Procedure: Left Heart Cath and Coronary Angiography;  Surgeon: Leonie Man, MD;  Location: [name] [facility];  Service: Cardiovascular;  Laterality: N/A;  . CARDIAC CATHETERIZATION N/A 03/11/2016   Procedure: Coronary Stent Intervention;  Surgeon: Leonie Man, MD;  Location: [place] [facility];  Service: Cardiovascular;  Laterality: N/A;  . CARDIAC CATHETERIZATION N/A 03/14/2016   Procedure: Coronary Stent Intervention;  Surgeon: Joory Gough M Martinique, MD;  Location: [place] [facility];  Service: Cardiovascular;  Laterality: N/A;  . CHOLECYSTECTOMY OPEN  1984  . CORONARY ANGIOPLASTY WITH STENT PLACEMENT  03/14/2016  . FINGER ARTHROPLASTY Left 05/14/2013   Procedure: LEFT THUMB CARPAL METACARPAL ARTHROPLASTY;  Surgeon: Tennis Must, MD;  Location: [place];  Service: Orthopedics;  Laterality: Left;  . TUBAL LIGATION  1987  . VAGINAL HYSTERECTOMY  2009     reports that she has quit smoking. Her smoking use included Cigarettes. She has a 38.00 pack-year smoking history. She has never used smokeless tobacco. She reports that she drinks about 3.6 oz of  alcohol per week . She reports that she uses drugs, including Marijuana.  No Known Allergies  Family History  Problem Relation Age of Onset  . Stroke Mother   . Hypertension Mother   . Diabetes Mother   . Heart failure Other      Prior to Admission medications   Medication Sig Start Date End Date Taking? Authorizing Provider  acetaminophen (TYLENOL) 500 MG tablet Take 1 tablet (500 mg total) by mouth every 8 (eight) hours as needed for mild pain, moderate pain or headache. 03/15/16  Yes Cheryln Manly, NP  aspirin EC 81 MG EC tablet Take 1 tablet (81 mg total) by mouth daily. 03/19/16  Yes Donne Hazel, MD  atorvastatin (LIPITOR) 80 MG tablet Take 1 tablet (80 mg total) by mouth daily at 6 PM. 03/15/16  Yes Cheryln Manly, NP  nitroGLYCERIN (NITROSTAT) 0.4 MG SL tablet Place 1 tablet (0.4 mg total) under the tongue every 5 (five) minutes x 3 doses as needed for chest pain. 09/06/15  Yes Almyra Deforest, PA  ticagrelor (BRILINTA) 90 MG TABS tablet Take 1 tablet (90 mg total) by mouth 2 (two) times daily. 03/18/16  Yes Donne Hazel, MD  AMBULATORY NON FORMULARY MEDICATION Take 90 mg by mouth 2 (two) times daily. Medication Name: BRILINTA 90 mg BID (TWILIGHT Research study PROVIDED) 03/15/16   Burnell Blanks, MD  AMBULATORY NON FORMULARY MEDICATION Take 81 mg by mouth daily. Medication Name: Aspirin 81 mg daily (TWILIGHT Research Study PROVIDED) 03/15/16   Burnell Blanks, MD  levofloxacin (LEVAQUIN) 500 MG tablet Take 1 tablet (500 mg total) by mouth daily. Patient not taking: Reported on 03/24/2016 03/15/16   Cheryln Manly, NP    Physical Exam: Vitals:   03/24/16 1315 03/24/16 1430 03/24/16 1435 03/24/16 1500  BP:   124/67 (!) 127/102  Pulse: 81 76 72 81  Resp: 25 20 22 23   Temp:      TempSrc:      SpO2: 97% 100% 100% 100%  Weight:      Height:          Constitutional: NAD, calm, comfortable Vitals:   03/24/16 1315 03/24/16 1430 03/24/16 1435 03/24/16 1500    BP:   124/67 (!) 127/102  Pulse: 81 76 72 81  Resp: 25 20 22 23   Temp:      TempSrc:      SpO2: 97% 100% 100% 100%  Weight:      Height:       Eyes: PERRL, lids and conjunctivae normal ENMT: Mucous membranes are moist. Posterior pharynx clear of any exudate or lesions.Normal dentition.  Neck: normal, supple, no masses, no thyromegaly Respiratory: clear to auscultation bilaterally, no wheezing, no crackles. Normal respiratory effort. No accessory muscle use.  Cardiovascular: Regular rate and rhythm, no murmurs / rubs / gallops. No extremity edema. 2+ pedal pulses. No carotid bruits.  Abdomen: no tenderness, no masses palpated. No hepatosplenomegaly. Bowel sounds positive.  Musculoskeletal: no clubbing / cyanosis. No joint deformity upper and lower extremities. Good ROM, no contractures. Normal muscle tone.  Skin: no rashes, lesions, ulcers. No induration Neurologic: CN 2-12 grossly intact. Sensation intact, DTR normal. Strength 5/5 in all 4.  Psychiatric: Normal judgment and insight. Alert and oriented x  3. Normal mood.     Labs on Admission: I have personally reviewed following labs and imaging studies  CBC:  Recent Labs Lab 03/17/16 1822 03/18/16 0612 03/18/16 1359 03/24/16 1227  WBC 12.5* 9.9 10.4 11.1*  NEUTROABS 9.0*  --   --  8.4*  HGB 11.0* 9.3* 9.2* 9.6*  HCT 32.3* 27.7* 27.5* 29.2*  MCV 84.1 84.7 84.9 85.1  PLT 397 390 372 [id number]*   Basic Metabolic Panel:  Recent Labs Lab 03/17/16 1822 03/18/16 0612 03/24/16 1227  NA 132* 134* 138  K 3.7 3.3* 3.6  CL 103 108 106  CO2 22 20* 22  GLUCOSE 104* 92 88  BUN 12 9 10   CREATININE 0.91 0.76 0.71  CALCIUM 8.7* 8.0* 9.6   GFR: Estimated Creatinine Clearance: 58.4 mL/min (by C-G formula based on SCr of 0.71 mg/dL). Liver Function Tests:  Recent Labs Lab 03/24/16 1227  AST 23  ALT 18  ALKPHOS 71  BILITOT 0.4  PROT 7.0  ALBUMIN 3.3*     Recent Labs Lab 03/17/16 1822 03/18/16 0612  TROPONINI 5.51*  4.61*   Urine analysis:    Component Value Date/Time   COLORURINE YELLOW 03/17/2016 2036   APPEARANCEUR CLEAR 03/17/2016 2036   LABSPEC >1.030 (H) 03/17/2016 2036   PHURINE 5.5 03/17/2016 2036   GLUCOSEU NEGATIVE 03/17/2016 2036   HGBUR NEGATIVE 03/17/2016 2036   BILIRUBINUR NEGATIVE 03/17/2016 2036   KETONESUR TRACE (A) 03/17/2016 2036   PROTEINUR NEGATIVE 03/17/2016 2036   NITRITE NEGATIVE 03/17/2016 2036   LEUKOCYTESUR NEGATIVE 03/17/2016 2036   ) Recent Results (from the past 240 hour(s))  C difficile quick scan w PCR reflex     Status: None   Collection Time: 03/14/16  8:46 PM  Result Value Ref Range Status   C Diff antigen NEGATIVE NEGATIVE Final   C Diff toxin NEGATIVE NEGATIVE Final   C Diff interpretation No C. difficile detected.  Final  Urine culture     Status: None   Collection Time: 03/17/16  8:36 PM  Result Value Ref Range Status   Specimen Description URINE, RANDOM  Final   Special Requests NONE  Final   Culture NO GROWTH Performed at [facility]   Final   Report Status 03/19/2016 FINAL  Final     Radiological Exams on Admission: Dg Chest 2 View  Result Date: 03/24/2016 CLINICAL DATA:  Patient with shortness of breath. Prior myocardial infarction. EXAM: CHEST  2 VIEW COMPARISON:  Chest radiograph 03/17/2016. FINDINGS: Monitoring leads overlie the patient. Normal cardiac and mediastinal contours. No consolidative pulmonary opacities. No pleural effusion or pneumothorax. Thoracic spine degenerative changes. IMPRESSION: No active cardiopulmonary disease. Electronically Signed   By: Lovey Newcomer M.D.   On: 03/24/2016 13:18   EKG: Independently reviewed.   Assessment/Plan Principal Problem:   SOB (shortness of breath) Active Problems:   Benign essential HTN   Hyperlipidemia   Hypotension   PLAN:   SOB:  I don't think she has an ACS, or in stent thrombosis, given benign EKG, no CP, and troponin much lower than her troponins of her MI recently.   Will admit her for cycling her troponins.  Cardiology has been consulted, and we will await further recommnendation.  She has no clinical evidence of PE, and D Dimer is very non specific.  It is possible that she does have some anxiety.  Will Tx her with IV ativan for now.  Her sat is 100 percent on RA.  Thank you for asking  me to consult on her.  Good Day.    DVT prophylaxis: Heparin Code Status: FULL  Family Communication: Husband at bedside.  Disposition Plan: To home when appropriate.  Consults called: Cardiology by EDP.  Admission status:  OBS.    Bentlee Benningfield MD FACP. [facility]  If 7PM-7AM, please contact night-coverage [email or web address] Password [email or web address]  03/24/2016, 3:49 PM

## 2016-03-24 NOTE — ED Triage Notes (Signed)
Pt states she became short of breath this morning.  Quit smoking 2 weeks ago due to having stents placed.  Denies chest pain.

## 2016-03-24 NOTE — ED Notes (Signed)
Pt taken to XR.  

## 2016-03-24 NOTE — ED Notes (Signed)
Attempted report x1. 

## 2016-03-24 NOTE — ED Notes (Signed)
Cardiology at bedside.

## 2016-03-25 DIAGNOSIS — I5042 Chronic combined systolic (congestive) and diastolic (congestive) heart failure: Secondary | ICD-10-CM | POA: Diagnosis present

## 2016-03-25 DIAGNOSIS — D649 Anemia, unspecified: Secondary | ICD-10-CM | POA: Diagnosis present

## 2016-03-25 DIAGNOSIS — I255 Ischemic cardiomyopathy: Secondary | ICD-10-CM | POA: Diagnosis present

## 2016-03-25 DIAGNOSIS — R748 Abnormal levels of other serum enzymes: Secondary | ICD-10-CM

## 2016-03-25 DIAGNOSIS — R7989 Other specified abnormal findings of blood chemistry: Secondary | ICD-10-CM

## 2016-03-25 DIAGNOSIS — F411 Generalized anxiety disorder: Secondary | ICD-10-CM | POA: Diagnosis present

## 2016-03-25 DIAGNOSIS — R778 Other specified abnormalities of plasma proteins: Secondary | ICD-10-CM | POA: Diagnosis present

## 2016-03-25 DIAGNOSIS — I2510 Atherosclerotic heart disease of native coronary artery without angina pectoris: Secondary | ICD-10-CM | POA: Diagnosis present

## 2016-03-25 DIAGNOSIS — R0602 Shortness of breath: Secondary | ICD-10-CM | POA: Diagnosis not present

## 2016-03-25 DIAGNOSIS — I25119 Atherosclerotic heart disease of native coronary artery with unspecified angina pectoris: Secondary | ICD-10-CM | POA: Diagnosis not present

## 2016-03-25 HISTORY — DX: Generalized anxiety disorder: F41.1

## 2016-03-25 LAB — TROPONIN I
Troponin I: 0.13 ng/mL (ref <0.03)
Troponin I: 0.12 ng/mL (ref <0.03)

## 2016-03-25 MED ORDER — LOSARTAN POTASSIUM 25 MG PO TABS
12.5000 mg | ORAL_TABLET | Freq: Every evening | ORAL | Status: DC
  Filled 2016-03-25 (×3): qty 0.5

## 2016-03-25 NOTE — Progress Notes (Signed)
PROGRESS NOTE    [name]  [id number] DOB: Nov 28, 1960 DOA: 03/24/2016 PCP: Jonathon Bellows, MD    Brief Narrative:  Patient is a 55 year old with a history of CAD, status post stenting 03/11/2016, ischemic cardiomyopathy with chronic combined heart failure-ejection fraction of 30-35% and grade 2 diastolic dysfunction per echo on 03/13/16, essential hypertension, anemia, and nose cancer, who presented to the ED on 03/24/16 with a chief complaint of shortness of breath. She also mentioned some anxiety. In the ED, her troponin I was 0.12, trending down from a level of 4.6 on 11/10. Her chest x-ray revealed no acute disease. Her d-dimer was mildly elevated, but she was oxygenating close to 100% on room air. She was admitted for further evaluation and management.   Assessment & Plan:   Principal Problem:   SOB (shortness of breath) Active Problems:   Cardiomyopathy, ischemic   Chronic combined systolic and diastolic congestive heart failure (HCC)   CAD (coronary artery disease)   Elevated troponin I level   Essential hypertension   Hyperlipidemia   Anxiety state   Normocytic anemia   1. Coronary artery disease/ Ischemic cardiomyopathy with chronic combined systolic/diastolic heart failure. The patient's troponin I was mildly elevated, but significantly lower than 1 week ago. Her chest x-ray revealed no pulmonary edema or infiltrate. She was restarted on all of her chronic medications. As needed lorazepam was ordered for a possible element of anxiety. -Cardiology was consulted and recommended medical management with cautiously reintroducing a beta blocker and low-dose ARB in the light of cardiomyopathy. They had been recently discontinued due to low-normal blood pressures. They recommended holding on diuretic therapy. -Her shortness of breath is thought to be related to her chronic heart failure, but there was no evidence of decompensation radiographically and peripherally on  exam. -We'll continue to monitor her another 24 hours to keep an eye on her blood pressure and anticipate discharge home tomorrow.  Normocytic anemia; iron deficiency anemia. The patient's baseline hemoglobin appears to be 9.0-9.5. Anemia studies from 03/18/16 revealed a ferritin of 357, normal reticulocyte count of 0.7, low total iron of 10, and normal folate/vitamin B12. -We'll start ferrous sulfate.  Thrombocytosis. This may be secondary to anemia. We'll continue to monitor.   DVT prophylaxis: Subcutaneous heparin Code Status: Full code Family Communication: Family not available Disposition Plan: Subcutaneous heparin   Consultants:   Cardiology  Procedures:  None  Antimicrobials:  None    Subjective: Patient says "I'm doing okay" now. She denies chest pain or shortness of breath currently.  Objective: Vitals:   03/24/16 2238 03/25/16 0515 03/25/16 1132 03/25/16 1248  BP: 104/67 109/63  (!) 103/59  Pulse: 83 81  81  Resp: 15 16  18   Temp: 98.5 F (36.9 C) 98.3 F (36.8 C)  97.9 F (36.6 C)  TempSrc: Oral Oral  Oral  SpO2:  97% 99% 100%  Weight:      Height:        Intake/Output Summary (Last 24 hours) at 03/25/16 1656 Last data filed at 03/25/16 1249  Gross per 24 hour  Intake              360 ml  Output              200 ml  Net              160 ml   Filed Weights   03/24/16 1121 03/24/16 1717  Weight: 52.2 kg (115 lb) 50.8 kg (112 lb)  Examination:  General exam: Appears calm and comfortable  Respiratory system: Clear to auscultation. Respiratory effort normal. Cardiovascular system: S1 & S2 heard, with a soft systolic murmur. No pedal edema. Gastrointestinal system: Abdomen is nondistended, soft and nontender. No organomegaly or masses felt. Normal bowel sounds heard. Central nervous system: Alert and oriented. No focal neurological deficits. Extremities: Symmetric 5 x 5 power. Skin: No rashes, lesions or ulcers Psychiatry: Judgement and  insight appear normal. Mood & affect flat.     Data Reviewed: I have personally reviewed following labs and imaging studies  CBC:  Recent Labs Lab 03/24/16 1227 03/24/16 1737  WBC 11.1* 9.4  NEUTROABS 8.4*  --   HGB 9.6* 9.3*  HCT 29.2* 28.3*  MCV 85.1 84.7  PLT 670* [id number]*   Basic Metabolic Panel:  Recent Labs Lab 03/24/16 1227 03/24/16 1737  NA 138  --   K 3.6  --   CL 106  --   CO2 22  --   GLUCOSE 88  --   BUN 10  --   CREATININE 0.71 0.69  CALCIUM 9.6  --    GFR: Estimated Creatinine Clearance: 58.4 mL/min (by C-G formula based on SCr of 0.69 mg/dL). Liver Function Tests:  Recent Labs Lab 03/24/16 1227  AST 23  ALT 18  ALKPHOS 71  BILITOT 0.4  PROT 7.0  ALBUMIN 3.3*   No results for input(s): LIPASE, AMYLASE in the last 168 hours. No results for input(s): AMMONIA in the last 168 hours. Coagulation Profile: No results for input(s): INR, PROTIME in the last 168 hours. Cardiac Enzymes:  Recent Labs Lab 03/24/16 1737 03/24/16 2242 03/25/16 0520  TROPONINI 0.14* 0.13* 0.12*   BNP (last 3 results) No results for input(s): PROBNP in the last 8760 hours. HbA1C: No results for input(s): HGBA1C in the last 72 hours. CBG: No results for input(s): GLUCAP in the last 168 hours. Lipid Profile: No results for input(s): CHOL, HDL, LDLCALC, TRIG, CHOLHDL, LDLDIRECT in the last 72 hours. Thyroid Function Tests:  Recent Labs  03/24/16 1744  TSH 0.366   Anemia Panel: No results for input(s): VITAMINB12, FOLATE, FERRITIN, TIBC, IRON, RETICCTPCT in the last 72 hours. Sepsis Labs: No results for input(s): PROCALCITON, LATICACIDVEN in the last 168 hours.  Recent Results (from the past 240 hour(s))  Urine culture     Status: None   Collection Time: 03/17/16  8:36 PM  Result Value Ref Range Status   Specimen Description URINE, RANDOM  Final   Special Requests NONE  Final   Culture NO GROWTH Performed at [facility]   Final   Report Status  03/19/2016 FINAL  Final         Radiology Studies: Dg Chest 2 View  Result Date: 03/24/2016 CLINICAL DATA:  Patient with shortness of breath. Prior myocardial infarction. EXAM: CHEST  2 VIEW COMPARISON:  Chest radiograph 03/17/2016. FINDINGS: Monitoring leads overlie the patient. Normal cardiac and mediastinal contours. No consolidative pulmonary opacities. No pleural effusion or pneumothorax. Thoracic spine degenerative changes. IMPRESSION: No active cardiopulmonary disease. Electronically Signed   By: Lovey Newcomer M.D.   On: 03/24/2016 13:18        Scheduled Meds: . aspirin EC  81 mg Oral Daily  . atorvastatin  80 mg Oral q1800  . carvedilol  3.125 mg Oral BID WC  . heparin  5,000 Units Subcutaneous Q8H  . losartan  12.5 mg Oral QPM  . sodium chloride flush  3 mL Intravenous Q12H  . ticagrelor  90 mg Oral BID   Continuous Infusions:   LOS: 0 days    Time spent: 30 minutes    Rexene Alberts, MD [facility] Pager [phone]  If 7PM-7AM, please contact night-coverage [email or web address] Password [email or web address] 03/25/2016, 4:56 PM

## 2016-03-25 NOTE — Care Management Note (Signed)
Case Management Note  Patient Details  Name: Erin [name] MRN: [id number] Date of Birth: 06-03-1960  Subjective/Objective:   Patient adm with SOB, Recent NSTEMI this month.      Action/Plan: Chart reviewed. No CM needs anticipated.    Expected Discharge Date:  03/26/16               Expected Discharge Plan:  Home/Self Care  In-House Referral:  NA  Discharge planning Services  CM Consult  Post Acute Care Choice:    Choice offered to:     DME Arranged:    DME Agency:     HH Arranged:    HH Agency:     Status of Service:  Completed, signed off  If discussed at [employer] of Stay Meetings, dates discussed:    Additional Comments:  Sabine Tenenbaum, Chauncey Reading, RN 03/25/2016, 12:59 PM

## 2016-03-25 NOTE — Progress Notes (Signed)
Patient Name: Erin [name] Date of Encounter: 03/25/2016  Primary Cardiologist: Dr. Dionisio David Problem List     Principal Problem:   SOB (shortness of breath) Active Problems:   Essential hypertension   Hyperlipidemia   Cardiomyopathy, ischemic   Chronic combined systolic and diastolic congestive heart failure (HCC)   Anxiety state   Elevated troponin I level   CAD (coronary artery disease)    Subjective   No chest pain or breathlessness at rest. Slept better last night with anxiolytic. No palpitations.   Inpatient Medications    Scheduled Meds: . aspirin EC  81 mg Oral Daily  . atorvastatin  80 mg Oral q1800  . carvedilol  3.125 mg Oral BID WC  . heparin  5,000 Units Subcutaneous Q8H  . sodium chloride flush  3 mL Intravenous Q12H  . ticagrelor  90 mg Oral BID    PRN Meds: acetaminophen **OR** acetaminophen, LORazepam   Vital Signs    Vitals:   03/24/16 1630 03/24/16 1717 03/24/16 2238 03/25/16 0515  BP: 135/89 (!) 142/77 104/67 109/63  Pulse:  84 83 81  Resp: 25 18 15 16   Temp:  98.7 F (37.1 C) 98.5 F (36.9 C) 98.3 F (36.8 C)  TempSrc:  Oral Oral Oral  SpO2:  100% 99% 97%  Weight:  112 lb (50.8 kg)    Height:  5' (1.524 m)     No intake or output data in the 24 hours ending 03/25/16 0902 Filed Weights   03/24/16 1121 03/24/16 1717  Weight: 115 lb (52.2 kg) 112 lb (50.8 kg)    Physical Exam    Gen.: Appears comfortable at rest. Neck: Supple, no elevated JVP or carotid bruits. Lungs: Clear to auscultation, nonlabored breathing at rest. Cardiac: Regular rate and rhythm, no S3 or significant systolic murmur, no pericardial rub. Abdomen: Soft, nontender, no hepatomegaly, bowel sounds present, no guarding or rebound. Extremities: No pitting edema, distal pulses 2+.  Labs    CBC  Recent Labs  03/24/16 1227 03/24/16 1737  WBC 11.1* 9.4  NEUTROABS 8.4*  --   HGB 9.6* 9.3*  HCT 29.2* 28.3*  MCV 85.1 84.7  PLT 670* 604*    Basic Metabolic Panel  Recent Labs  03/24/16 1227 03/24/16 1737  NA 138  --   K 3.6  --   CL 106  --   CO2 22  --   GLUCOSE 88  --   BUN 10  --   CREATININE 0.71 0.69  CALCIUM 9.6  --    Liver Function Tests  Recent Labs  03/24/16 1227  AST 23  ALT 18  ALKPHOS 71  BILITOT 0.4  PROT 7.0  ALBUMIN 3.3*   Cardiac Enzymes  Recent Labs  03/24/16 1737 03/24/16 2242 03/25/16 0520  TROPONINI 0.14* 0.13* 0.12*   Thyroid Function Tests  Recent Labs  03/24/16 1744  TSH 0.366    Telemetry    I personally reviewed telemetry monitoring which showed normal sinus rhythm.  Radiology    Dg Chest 2 View  Result Date: 03/24/2016 CLINICAL DATA:  Patient with shortness of breath. Prior myocardial infarction. EXAM: CHEST  2 VIEW COMPARISON:  Chest radiograph 03/17/2016. FINDINGS: Monitoring leads overlie the patient. Normal cardiac and mediastinal contours. No consolidative pulmonary opacities. No pleural effusion or pneumothorax. Thoracic spine degenerative changes. IMPRESSION: No active cardiopulmonary disease. Electronically Signed   By: Lovey Newcomer M.D.   On: 03/24/2016 13:18    Cardiac Studies   Echocardiogram  03/13/2016: Study Conclusions  - Left ventricle: The cavity size was normal. Wall thickness was   normal. Systolic function was moderately to severely reduced. The   estimated ejection fraction was in the range of 30% to 35%. There   is akinesis of the inferolateral, inferior, and inferoseptal   myocardium. Features are consistent with a pseudonormal left   ventricular filling pattern, with concomitant abnormal relaxation   and increased filling pressure (grade 2 diastolic dysfunction). - Mitral valve: There was mild regurgitation.  Impressions:  - When compared to prior, inferior lateral MI is new (EF was 55%,   now reduced)  Patient Profile     55 year old woman with history of NSTEMI earlier this month status post placement of DES to the  circumflex and staged DES to the RCA. She also has an ischemic cardiomyopathy with LVEF 30-35%, recently taken off beta blocker and ACE inhibitor with relative hypotension and dehydration. She presents with shortness of breath and also feeling of anxiety. No chest pressure. Troponin I levels are not suggestive of recurrent ACS.  Assessment & Plan    1. Shortness of breath, likely related to acute on chronic combined heart failure with elevated LVEDP in the setting of ischemic cardiomyopathy. She has tolerated the addition of low-dose Coreg, we will aim to add Cozaar beginning at 12.5 mg daily in the evening. No diuretic for now.  2. Elevated troponin I level, likely still trending down from her recent event and not suggestive of acute ACS at this time.  3. CAD status post NSTEMI in early November, DES to the circumflex and staged DES to the RCA.  4. Hyperlipidemia, continues on Lipitor.  5. History of tobacco abuse, recently stopped smoking.  6. Anemia, no active bleeding noted. Hemoglobin relatively stable at 9.3.  Continue Coreg 3.125 mg twice daily, and Cozaar 12.5 mg daily as an evening dose. Otherwise remain on aspirin and Brilinta as well as Lipitor. No further ischemic testing anticipated at this time. She has a cardiology visit scheduled for Monday already. Anticipate discharge home today.  Signed, Satira Sark, M.D., F.A.C.C.  03/25/2016, 9:02 AM

## 2016-03-26 ENCOUNTER — Encounter (HOSPITAL_COMMUNITY): Payer: Self-pay | Admitting: Internal Medicine

## 2016-03-26 DIAGNOSIS — I255 Ischemic cardiomyopathy: Secondary | ICD-10-CM | POA: Diagnosis not present

## 2016-03-26 DIAGNOSIS — R0602 Shortness of breath: Secondary | ICD-10-CM | POA: Diagnosis not present

## 2016-03-26 DIAGNOSIS — D473 Essential (hemorrhagic) thrombocythemia: Secondary | ICD-10-CM | POA: Diagnosis present

## 2016-03-26 DIAGNOSIS — F411 Generalized anxiety disorder: Secondary | ICD-10-CM | POA: Diagnosis not present

## 2016-03-26 DIAGNOSIS — I5042 Chronic combined systolic (congestive) and diastolic (congestive) heart failure: Secondary | ICD-10-CM | POA: Diagnosis not present

## 2016-03-26 DIAGNOSIS — D75839 Thrombocytosis, unspecified: Secondary | ICD-10-CM

## 2016-03-26 HISTORY — DX: Thrombocytosis, unspecified: D75.839

## 2016-03-26 LAB — BASIC METABOLIC PANEL
Anion gap: 4 — ABNORMAL LOW (ref 5–15)
BUN: 9 mg/dL (ref 6–20)
CO2: 24 mmol/L (ref 22–32)
Calcium: 8.6 mg/dL — ABNORMAL LOW (ref 8.9–10.3)
Chloride: 109 mmol/L (ref 101–111)
Creatinine, Ser: 0.74 mg/dL (ref 0.44–1.00)
GFR calc Af Amer: >60 mL/min (ref >60)
GFR calc non Af Amer: >60 mL/min (ref >60)
Glucose, Bld: 92 mg/dL (ref 65–99)
Potassium: 4.1 mmol/L (ref 3.5–5.1)
Sodium: 137 mmol/L (ref 135–145)

## 2016-03-26 LAB — CBC
HCT: 29.1 % — ABNORMAL LOW (ref 36.0–46.0)
Hemoglobin: 9.5 g/dL — ABNORMAL LOW (ref 12.0–15.0)
MCH: 27.9 pg (ref 26.0–34.0)
MCHC: 32.6 g/dL (ref 30.0–36.0)
MCV: 85.3 fL (ref 78.0–100.0)
Platelets: 644 10*3/uL — ABNORMAL HIGH (ref 150–400)
RBC: 3.41 MIL/uL — ABNORMAL LOW (ref 3.87–5.11)
RDW: 13.7 % (ref 11.5–15.5)
WBC: 8.7 10*3/uL (ref 4.0–10.5)

## 2016-03-26 MED ORDER — CARVEDILOL 3.125 MG PO TABS: 3.1250 mg | ORAL_TABLET | Freq: Two times a day (BID) | ORAL | 3 refills | Status: DC

## 2016-03-26 MED ORDER — LOSARTAN POTASSIUM 25 MG PO TABS: 12.5000 mg | ORAL_TABLET | Freq: Every evening | ORAL | 3 refills | Status: DC

## 2016-03-26 MED ORDER — LORAZEPAM 0.5 MG PO TABS: 0.5000 mg | ORAL_TABLET | Freq: Two times a day (BID) | ORAL | 0 refills | Status: DC | PRN

## 2016-03-26 MED ORDER — FERROUS SULFATE 325 (65 FE) MG PO TABS: 325.0000 mg | ORAL_TABLET | Freq: Every day | ORAL | Status: DC

## 2016-03-26 NOTE — Discharge Summary (Signed)
Physician Discharge Summary  Alphonsus Sias [id number] DOB: 03/06/1961 DOA: 03/24/2016  PCP: Jonathon Bellows, MD  Admit date: 03/24/2016 Discharge date: 03/26/2016  Time spent: Greater than 30 minutes  Recommendations for Outpatient Follow-up:  1. Patient was instructed to follow-up with cardiology as previously scheduled. (Patient was discharged on the addition of carvedilol and losartan).  2. Consider elective outpatient referral to gastroenterology for evaluation of anemia. 3. Consider outpatient hematology referral if she continues to have persistent thrombocytosis.    Discharge Diagnoses:  1. Coronary artery disease/ischemic cardiomyopathy. 2. Chronic combined systolic and diastolic congestive heart failure. 3. Elevated troponin I with the trend downward. 4. Hyperlipidemia. 5. Normocytic anemia/iron deficiency anemia. 6. Thrombocytosis, possibly from anemia. 7. Anxiety state.   Discharge Condition: Improved.  Diet recommendation: Heart healthy.  Filed Weights   03/24/16 1121 03/24/16 1717  Weight: 52.2 kg (115 lb) 50.8 kg (112 lb)    History of present illness:  Patient is a 55 year old with a history of CAD, status post stenting 03/11/2016, ischemic cardiomyopathy with chronic combined heart failure-ejection fraction of 30-35% and grade 2 diastolic dysfunction per echo on 03/13/16, essential hypertension, anemia, and nose cancer, who presented to the ED on 03/24/16 with a chief complaint of shortness of breath. She also mentioned some anxiety. In the ED, her troponin I was 0.12, trending down from a level of 4.6 on 11/10. Her chest x-ray revealed no acute disease. Her d-dimer was mildly elevated, but she was oxygenating close to 100% on room air. She was admitted for further evaluation and management.   Hospital Course:  1. Coronary artery disease/ Ischemic cardiomyopathy with chronic combined systolic/diastolic heart failure. The patient's troponin I was mildly  elevated, but significantly lower than 1 week ago. Her chest x-ray revealed no pulmonary edema or infiltrate. She was restarted on all of her chronic medications. As needed lorazepam was ordered for a likely element of anxiety. -Cardiology was consulted and recommended medical management with cautiously reintroducing a beta blocker and low-dose ARB in the light of cardiomyopathy. Both medication classes had been recently discontinued due to low-normal blood pressures. Cardiologist, Dr. Domenic Polite also recommended holding on diuretic therapy. -Her shortness of breath was thought to be related to her chronic heart failure, but there was no evidence of decompensation radiographically and peripherally on exam. Anxiety may have also been an issue. -Carvedilol was reintroduced at 3.125 mg twice a day and losartan was started at 12.5 mg daily at bedtime. -Following the start of these medications, her blood pressure remained in the [id number] systolically and her shortness of breath subsided and then resolved. -She will follow-up with cardiology as scheduled in 2 days.  Anxiety state. Patient admitted to being anxious due to her recent heart attack. As needed lorazepam was ordered during the hospitalization which helped her. She was given a prescription for lorazepam with no refills. She was encouraged to discuss further treatment with her PCP.  Normocytic anemia; iron deficiency anemia. The patient's baseline hemoglobin appeared to be 9.0-9.5. Anemia studies from 03/18/16 revealed a ferritin of 357, normal reticulocyte count of 0.7, low total iron of 10, and normal folate/vitamin B12. -Ferrous sulfate was started. The patient would likely need an elective colonoscopy at some point in the future. This will be deferred to her PCP.  Thrombocytosis. Her elevated platelet count may have been secondary to anemia. Her platelet count was 644 at the time of discharge. -Consider outpatient hematology  referral.   Procedures:  None  Consultations:  Cardiology  Discharge  Exam: Vitals:   03/25/16 2059 03/26/16 0632  BP: 110/70 112/62  Pulse: 79 88  Resp: 16 16  Temp: 97.7 F (36.5 C) 98.1 F (36.7 C)    General: Pleasant 55 year old woman in no acute distress. Cardiovascular: S1, S2, with a soft systolic murmur. No pedal edema. Respiratory: Clear to auscultation bilaterally.   Discharge Instructions   Discharge Instructions    Diet - low sodium heart healthy    Complete by:  As directed    Increase activity slowly    Complete by:  As directed      Current Discharge Medication List    START taking these medications   Details  carvedilol (COREG) 3.125 MG tablet Take 1 tablet (3.125 mg total) by mouth 2 (two) times daily with a meal. Qty: 60 tablet, Refills: 3    ferrous sulfate 325 (65 FE) MG tablet Take 1 tablet (325 mg total) by mouth daily with breakfast.    LORazepam (ATIVAN) 0.5 MG tablet Take 1 tablet (0.5 mg total) by mouth every 12 (twelve) hours as needed for anxiety. DO NOT DRIVE WHEN TAKING THIS MEDICATION. Qty: 20 tablet, Refills: 0    losartan (COZAAR) 25 MG tablet Take 0.5 tablets (12.5 mg total) by mouth every evening. Qty: 15 tablet, Refills: 3      CONTINUE these medications which have NOT CHANGED   Details  acetaminophen (TYLENOL) 500 MG tablet Take 1 tablet (500 mg total) by mouth every 8 (eight) hours as needed for mild pain, moderate pain or headache. Qty: 30 tablet, Refills: 0    atorvastatin (LIPITOR) 80 MG tablet Take 1 tablet (80 mg total) by mouth daily at 6 PM. Qty: 30 tablet, Refills: 6    nitroGLYCERIN (NITROSTAT) 0.4 MG SL tablet Place 1 tablet (0.4 mg total) under the tongue every 5 (five) minutes x 3 doses as needed for chest pain. Qty: 25 tablet, Refills: 3    !! AMBULATORY NON FORMULARY MEDICATION Take 90 mg by mouth 2 (two) times daily. Medication Name: BRILINTA 90 mg BID (TWILIGHT Research study PROVIDED)    !!  AMBULATORY NON FORMULARY MEDICATION Take 81 mg by mouth daily. Medication Name: Aspirin 81 mg daily (TWILIGHT Research Study PROVIDED)     !! - Potential duplicate medications found. Please discuss with provider.    STOP taking these medications     aspirin EC 81 MG EC tablet      ticagrelor (BRILINTA) 90 MG TABS tablet      levofloxacin (LEVAQUIN) 500 MG tablet        No Known Allergies Follow-up Information    Jory Sims, NP Follow up.   Specialties:  Nurse Practitioner, Radiology, Cardiology Why:  F/UP AS SCHEDULED Contact information: [address] [place] [address] [phone]            The results of significant diagnostics from this hospitalization (including imaging, microbiology, ancillary and laboratory) are listed below for reference.    Significant Diagnostic Studies: Dg Chest 2 View  Result Date: 03/24/2016 CLINICAL DATA:  Patient with shortness of breath. Prior myocardial infarction. EXAM: CHEST  2 VIEW COMPARISON:  Chest radiograph 03/17/2016. FINDINGS: Monitoring leads overlie the patient. Normal cardiac and mediastinal contours. No consolidative pulmonary opacities. No pleural effusion or pneumothorax. Thoracic spine degenerative changes. IMPRESSION: No active cardiopulmonary disease. Electronically Signed   By: Lovey Newcomer M.D.   On: 03/24/2016 13:18   Dg Chest Port 1 View  Result Date: 03/12/2016 CLINICAL DATA:  Pt having SOB,  no cp. Hx of hypertension. Cardiac cath 03/11/16. Smoker for 11 years. EXAM: PORTABLE CHEST 1 VIEW COMPARISON:  03/11/2016 FINDINGS: Opacity has developed at the right lung base since the prior study. This is likely atelectasis. Consider pneumonia if there are consistent clinical symptoms. Remainder of the lungs is clear. No convincing pleural effusion. No pneumothorax. Heart, mediastinum and hila are unremarkable. IMPRESSION: 1. New right lung base opacity most likely atelectasis. Pneumonia is possible. Electronically Signed    By: Lajean Manes M.D.   On: 03/12/2016 09:19   Dg Abd Acute W/chest  Result Date: 03/17/2016 CLINICAL DATA:  55 y/o F; weakness, dizziness, and low blood pressure with recent cardiac stent placement. EXAM: DG ABDOMEN ACUTE W/ 1V CHEST COMPARISON:  03/12/2016 chest radiograph FINDINGS: There is no evidence of dilated bowel loops or free intraperitoneal air. No radiopaque calculi or other significant radiographic abnormality is seen. Heart size and mediastinal contours are within normal limits. Both lungs are clear, interval resolution of right lung base opacity. Right upper quadrant surgical clips, presumably cholecystectomy. Degenerative changes of the symphysis pubis. IMPRESSION: Negative abdominal radiographs.  No acute cardiopulmonary disease. Electronically Signed   By: Kristine Garbe M.D.   On: 03/17/2016 19:18    Microbiology: Recent Results (from the past 240 hour(s))  Urine culture     Status: None   Collection Time: 03/17/16  8:36 PM  Result Value Ref Range Status   Specimen Description URINE, RANDOM  Final   Special Requests NONE  Final   Culture NO GROWTH Performed at [facility]   Final   Report Status 03/19/2016 FINAL  Final     Labs: Basic Metabolic Panel:  Recent Labs Lab 03/24/16 1227 03/24/16 1737 03/26/16 0723  NA 138  --  137  K 3.6  --  4.1  CL 106  --  109  CO2 22  --  24  GLUCOSE 88  --  92  BUN 10  --  9  CREATININE 0.71 0.69 0.74  CALCIUM 9.6  --  8.6*   Liver Function Tests:  Recent Labs Lab 03/24/16 1227  AST 23  ALT 18  ALKPHOS 71  BILITOT 0.4  PROT 7.0  ALBUMIN 3.3*   No results for input(s): LIPASE, AMYLASE in the last 168 hours. No results for input(s): AMMONIA in the last 168 hours. CBC:  Recent Labs Lab 03/24/16 1227 03/24/16 1737 03/26/16 0723  WBC 11.1* 9.4 8.7  NEUTROABS 8.4*  --   --   HGB 9.6* 9.3* 9.5*  HCT 29.2* 28.3* 29.1*  MCV 85.1 84.7 85.3  PLT 670* 604* 644*   Cardiac Enzymes:  Recent  Labs Lab 03/24/16 1737 03/24/16 2242 03/25/16 0520  TROPONINI 0.14* 0.13* 0.12*   BNP: BNP (last 3 results)  Recent Labs  03/17/16 1822  BNP 221.0*    ProBNP (last 3 results) No results for input(s): PROBNP in the last 8760 hours.  CBG: No results for input(s): GLUCAP in the last 168 hours.     Signed:  Keltie Labell MD.  [facility] 03/26/2016, 11:38 AM

## 2016-03-26 NOTE — Progress Notes (Signed)
Patient discharged with instructions, prescription, and care notes.  Verbalized understanding via teach back.  IV was removed and the site was WNL. Patient voiced no further complaints or concerns at the time of discharge.  Appointments scheduled per instructions.  Patient left the floor via w/c family  And staff in stable condition.    Discussed with Dr. Caryn Section the patients med rec sheet.  MD stated that the do not take meds was from the twilight research project.  That she should continue the brilinta and asa.  Voiced this to the patient and corrected the med rec sheet.

## 2016-03-28 ENCOUNTER — Encounter: Payer: Self-pay | Admitting: Adult Health

## 2016-03-28 ENCOUNTER — Encounter: Payer: Self-pay | Admitting: *Deleted

## 2016-03-28 ENCOUNTER — Ambulatory Visit (INDEPENDENT_AMBULATORY_CARE_PROVIDER_SITE_OTHER): Payer: PRIVATE HEALTH INSURANCE | Admitting: Adult Health

## 2016-03-28 VITALS — BP 108/76 | HR 84 | Ht 60.0 in | Wt 117.0 lb

## 2016-03-28 DIAGNOSIS — I2510 Atherosclerotic heart disease of native coronary artery without angina pectoris: Secondary | ICD-10-CM | POA: Diagnosis not present

## 2016-03-28 DIAGNOSIS — I255 Ischemic cardiomyopathy: Secondary | ICD-10-CM | POA: Diagnosis not present

## 2016-03-28 DIAGNOSIS — F418 Other specified anxiety disorders: Secondary | ICD-10-CM | POA: Diagnosis not present

## 2016-03-28 NOTE — Progress Notes (Signed)
Name: Erin [name]    DOB: 1960/08/30  Age: 55 y.o.  MR#: [id number]       PCP:  Jonathon Bellows, MD      Insurance: Payor: MEDCOST / Plan: MEDCOST / Product Type: *No Product type* /   CC:   No chief complaint on file.   VS Vitals:   03/28/16 1545  BP: 108/76  Pulse: 84  SpO2: 98%  Weight: 117 lb (53.1 kg)  Height: 5' (1.524 m)    Weights Current Weight  03/28/16 117 lb (53.1 kg)  03/24/16 112 lb (50.8 kg)  03/17/16 115 lb 1.3 oz (52.2 kg)    Blood Pressure  BP Readings from Last 3 Encounters:  03/28/16 108/76  03/26/16 112/62  03/18/16 (!) 98/58     Admit date:  (Not on file) Last encounter with RMR:  03/17/2016   Allergy Patient has no known allergies.  Current Outpatient Prescriptions  Medication Sig Dispense Refill  . acetaminophen (TYLENOL) 500 MG tablet Take 1 tablet (500 mg total) by mouth every 8 (eight) hours as needed for mild pain, moderate pain or headache. 30 tablet 0  . AMBULATORY NON FORMULARY MEDICATION Take 90 mg by mouth 2 (two) times daily. Medication Name: BRILINTA 90 mg BID (TWILIGHT Research study PROVIDED)    . AMBULATORY NON FORMULARY MEDICATION Take 81 mg by mouth daily. Medication Name: Aspirin 81 mg daily (TWILIGHT Research Study PROVIDED)    . atorvastatin (LIPITOR) 80 MG tablet Take 1 tablet (80 mg total) by mouth daily at 6 PM. 30 tablet 6  . carvedilol (COREG) 3.125 MG tablet Take 1 tablet (3.125 mg total) by mouth 2 (two) times daily with a meal. 60 tablet 3  . LORazepam (ATIVAN) 0.5 MG tablet Take 1 tablet (0.5 mg total) by mouth every 12 (twelve) hours as needed for anxiety. DO NOT DRIVE WHEN TAKING THIS MEDICATION. 20 tablet 0  . losartan (COZAAR) 25 MG tablet Take 0.5 tablets (12.5 mg total) by mouth every evening. 15 tablet 3  . nitroGLYCERIN (NITROSTAT) 0.4 MG SL tablet Place 1 tablet (0.4 mg total) under the tongue every 5 (five) minutes x 3 doses as needed for chest pain. 25 tablet 3   No current facility-administered medications for  this visit.     Discontinued Meds:    Medications Discontinued During This Encounter  Medication Reason  . ferrous sulfate 325 (65 FE) MG tablet Error    Patient Active Problem List   Diagnosis Date Noted  . Thrombocytosis ([place]) 03/26/2016  . Cardiomyopathy, ischemic 03/25/2016  . Chronic combined systolic and diastolic congestive heart failure ([place]) 03/25/2016  . Anxiety state 03/25/2016  . Elevated troponin I level 03/25/2016  . CAD (coronary artery disease) 03/25/2016  . Normocytic anemia 03/25/2016  . SOB (shortness of breath) 03/24/2016  . Lightheadedness 03/17/2016  . Hypotension 03/17/2016  . Tobacco abuse 03/12/2016  . NSTEMI (non-ST elevated myocardial infarction) ([place]) 03/11/2016  . Pain in the chest   . Essential hypertension 09/06/2015  . Hyperlipidemia 09/06/2015  . GERD (gastroesophageal reflux disease) 09/06/2015  . Chest pain 09/06/2015    LABS    Component Value Date/Time   NA 137 03/26/2016 0723   NA 138 03/24/2016 1227   NA 134 (L) 03/18/2016 0612   K 4.1 03/26/2016 0723   K 3.6 03/24/2016 1227   K 3.3 (L) 03/18/2016 0612   CL 109 03/26/2016 0723   CL 106 03/24/2016 1227   CL 108 03/18/2016 0612   CO2 24  03/26/2016 0723   CO2 22 03/24/2016 1227   CO2 20 (L) 03/18/2016 0612   GLUCOSE 92 03/26/2016 0723   GLUCOSE 88 03/24/2016 1227   GLUCOSE 92 03/18/2016 0612   BUN 9 03/26/2016 0723   BUN 10 03/24/2016 1227   BUN 9 03/18/2016 0612   CREATININE 0.74 03/26/2016 0723   CREATININE 0.69 03/24/2016 1737   CREATININE 0.71 03/24/2016 1227   CALCIUM 8.6 (L) 03/26/2016 0723   CALCIUM 9.6 03/24/2016 1227   CALCIUM 8.0 (L) 03/18/2016 0612   GFRNONAA >60 03/26/2016 0723   GFRNONAA >60 03/24/2016 1737   GFRNONAA >60 03/24/2016 1227   GFRAA >60 03/26/2016 0723   GFRAA >60 03/24/2016 1737   GFRAA >60 03/24/2016 1227   CMP     Component Value Date/Time   NA 137 03/26/2016 0723   K 4.1 03/26/2016 0723   CL 109 03/26/2016 0723   CO2 24 03/26/2016  0723   GLUCOSE 92 03/26/2016 0723   BUN 9 03/26/2016 0723   CREATININE 0.74 03/26/2016 0723   CALCIUM 8.6 (L) 03/26/2016 0723   PROT 7.0 03/24/2016 1227   ALBUMIN 3.3 (L) 03/24/2016 1227   AST 23 03/24/2016 1227   ALT 18 03/24/2016 1227   ALKPHOS 71 03/24/2016 1227   BILITOT 0.4 03/24/2016 1227   GFRNONAA >60 03/26/2016 0723   GFRAA >60 03/26/2016 0723       Component Value Date/Time   WBC 8.7 03/26/2016 0723   WBC 9.4 03/24/2016 1737   WBC 11.1 (H) 03/24/2016 1227   HGB 9.5 (L) 03/26/2016 0723   HGB 9.3 (L) 03/24/2016 1737   HGB 9.6 (L) 03/24/2016 1227   HCT 29.1 (L) 03/26/2016 0723   HCT 28.3 (L) 03/24/2016 1737   HCT 29.2 (L) 03/24/2016 1227   MCV 85.3 03/26/2016 0723   MCV 84.7 03/24/2016 1737   MCV 85.1 03/24/2016 1227    Lipid Panel     Component Value Date/Time   CHOL 273 (H) 01/15/2016 0832   TRIG 371 (H) 01/15/2016 0832   HDL 53 01/15/2016 0832   CHOLHDL 5.2 (H) 01/15/2016 0832   VLDL 74 (H) 01/15/2016 0832   LDLCALC 146 (H) 01/15/2016 0832    ABG    Component Value Date/Time   TCO2 22 03/11/2016 1649     Lab Results  Component Value Date   TSH 0.366 03/24/2016   BNP (last 3 results)  Recent Labs  03/17/16 1822  BNP 221.0*    ProBNP (last 3 results) No results for input(s): PROBNP in the last 8760 hours.  Cardiac Panel (last 3 results) No results for input(s): CKTOTAL, CKMB, TROPONINI, RELINDX in the last 72 hours.  Iron/TIBC/Ferritin/ %Sat    Component Value Date/Time   IRON 10 (L) 03/18/2016 0940   TIBC 252 03/18/2016 0940   FERRITIN 356 (H) 03/18/2016 0940   IRONPCTSAT 4 (L) 03/18/2016 0940     EKG Orders placed or performed during the hospital encounter of 03/24/16  . EKG 12-Lead  . EKG 12-Lead  . EKG     Prior Assessment and Plan Problem List as of 03/28/2016 Reviewed: 03/17/2016 10:01 PM by Orvan Falconer, MD     Cardiovascular and Mediastinum   Essential hypertension   NSTEMI (non-ST elevated myocardial infarction) ([place])    Hypotension   Cardiomyopathy, ischemic   Chronic combined systolic and diastolic congestive heart failure (H[facility])   CAD (coronary artery disease)     Digestive   GERD (gastroesophageal reflux disease)     Hematopoietic and  Hemostatic   Thrombocytosis (H[facility])     Other   Hyperlipidemia   Chest pain   Pain in the chest   Tobacco abuse   Lightheadedness   SOB (shortness of breath)   Anxiety state   Elevated troponin I level   Normocytic anemia       Imaging: Dg Chest 2 View  Result Date: 03/24/2016 CLINICAL DATA:  Patient with shortness of breath. Prior myocardial infarction. EXAM: CHEST  2 VIEW COMPARISON:  Chest radiograph 03/17/2016. FINDINGS: Monitoring leads overlie the patient. Normal cardiac and mediastinal contours. No consolidative pulmonary opacities. No pleural effusion or pneumothorax. Thoracic spine degenerative changes. IMPRESSION: No active cardiopulmonary disease. Electronically Signed   By: Lovey Newcomer M.D.   On: 03/24/2016 13:18   Dg Chest Port 1 View  Result Date: 03/12/2016 CLINICAL DATA:  Pt having SOB, no cp. Hx of hypertension. Cardiac cath 03/11/16. Smoker for 11 years. EXAM: PORTABLE CHEST 1 VIEW COMPARISON:  03/11/2016 FINDINGS: Opacity has developed at the right lung base since the prior study. This is likely atelectasis. Consider pneumonia if there are consistent clinical symptoms. Remainder of the lungs is clear. No convincing pleural effusion. No pneumothorax. Heart, mediastinum and hila are unremarkable. IMPRESSION: 1. New right lung base opacity most likely atelectasis. Pneumonia is possible. Electronically Signed   By: Lajean Manes M.D.   On: 03/12/2016 09:19   Dg Abd Acute W/chest  Result Date: 03/17/2016 CLINICAL DATA:  55 y/o F; weakness, dizziness, and low blood pressure with recent cardiac stent placement. EXAM: DG ABDOMEN ACUTE W/ 1V CHEST COMPARISON:  03/12/2016 chest radiograph FINDINGS: There is no evidence of dilated bowel loops or free  intraperitoneal air. No radiopaque calculi or other significant radiographic abnormality is seen. Heart size and mediastinal contours are within normal limits. Both lungs are clear, interval resolution of right lung base opacity. Right upper quadrant surgical clips, presumably cholecystectomy. Degenerative changes of the symphysis pubis. IMPRESSION: Negative abdominal radiographs.  No acute cardiopulmonary disease. Electronically Signed   By: Kristine Garbe M.D.   On: 03/17/2016 19:18

## 2016-03-28 NOTE — Patient Instructions (Signed)
Your physician recommends that you schedule a follow-up appointment in: [place] physician recommends that you continue on your current medications as directed. Please refer to the Current Medication list given to you today.  If you need a refill on your cardiac medications before your next appointment, please call your pharmacy.  You have been given a letter to return to work with restrictions.   Thank you for choosing [facility]!

## 2016-03-28 NOTE — Progress Notes (Signed)
Cardiology Office Note   Date:  03/28/2016   ID:  Erin [name], DOB [birth year], MRN [id number]  PCP:  Jonathon Bellows, MD  Cardiologist: Fransico Him MD/  Jory Sims, NP   No chief complaint on file.     History of Present Illness: Erin [name] is a 55 y.o. female who presents for posthospitalization follow-up after being admitted for acute shortness of breath, anxiety and exacerbation, and chest tightness. She was seen on consultation by Dr. Domenic Polite. EKG revealed new inferior lateral ST-T wave abnormalities compared to her most recent tracing. She had been admitted recently for non-ST elevation MI and her troponin level was much more elevated. This was trending down on most recent evaluation by Dr. Domenic Polite during most recent hospitalization.  The patient was found to have no new changes in her cardiac status, ARB was reintroduced in light of cardiomyopathy, with holding diuretic therapy. She had no evidence of decompensated CHF. Carvedilol was reintroduced at 3.125 mg twice a day I losartan 12.5 mg daily at bedtime, blood pressure was stable throughout hospitalization. The patient also was treated with lorazepam for exacerbation of her anxiety. She was to follow up with PCP for more refills. She was also started on ferrous sulfate in the setting of normocytic anemia and iron deficiency anemia.  Echocardiogram 03/13/2016 Left ventricle: The cavity size was normal. Wall thickness was   normal. Systolic function was moderately to severely reduced. The   estimated ejection fraction was in the range of 30% to 35%. There   is akinesis of the inferolateral, inferior, and inferoseptal   myocardium. Features are consistent with a pseudonormal left   ventricular filling pattern, with concomitant abnormal relaxation   and increased filling pressure (grade 2 diastolic dysfunction). - Mitral valve: There was mild regurgitation.   Cardiac Cath 03/14/2016  Mid Cx lesion, 0 %stenosed.  A  drug eluting .  A STENT PROMUS PREM MR 2.25X16 drug eluting stent was successfully placed.  A STENT PROMUS PREM MR 2.25X8 drug-eluting stent was successfully placed.  Dist RCA lesion, 60 %stenosed.  Post intervention, there is a 0% residual stenosis.  Post Atrio lesion, 0 %stenosed.  Post intervention, there is a 0% residual stenosis.  A stent was successfully placed.   1. Successful Stenting of the distal RCA/PDA with DES x 2.  Plan: DAPT for one year. Risk factor modification. She is here today with generalized fatigue, no further complaints of chest pain and a rapid heart rhythm. She is very tearful and anxious. She has not yet returned to work. She is medically compliant.  Past Medical History:  Diagnosis Date  . Anxiety state 03/25/2016  . Basal cell carcinoma of forehead   . Coronary artery disease    a. 03/11/16 PCI with DES-->Prox/Mid Cx, 03/14/16 PCI with DES x2-->RCA, EF 30-35%  . Essential hypertension   . GERD (gastroesophageal reflux disease)   . History of pneumonia   . Hyperlipidemia   . Ischemic cardiomyopathy   . Mitral regurgitation   . NSTEMI (non-ST elevated myocardial infarction) ([place]) 03/10/2016  . Squamous cell cancer of skin of nose   . Thrombocytosis ([place]) 03/26/2016  . Wears dentures   . Wears glasses     Past Surgical History:  Procedure Laterality Date  . APPENDECTOMY    . CARDIAC CATHETERIZATION N/A 03/11/2016   Procedure: Left Heart Cath and Coronary Angiography;  Surgeon: Leonie Man, MD;  Location: [facility];  Service: Cardiovascular;  Laterality: N/A;  . CARDIAC CATHETERIZATION  N/A 03/11/2016   Procedure: Coronary Stent Intervention;  Surgeon: Leonie Man, MD;  Location: [facility];  Service: Cardiovascular;  Laterality: N/A;  . CARDIAC CATHETERIZATION N/A 03/14/2016   Procedure: Coronary Stent Intervention;  Surgeon: Peter M Martinique, MD;  Location: [facility];  Service: Cardiovascular;  Laterality: N/A;  .  CHOLECYSTECTOMY OPEN  1984  . CORONARY ANGIOPLASTY WITH STENT PLACEMENT  03/14/2016  . FINGER ARTHROPLASTY Left 05/14/2013   Procedure: LEFT THUMB CARPAL METACARPAL ARTHROPLASTY;  Surgeon: Tennis Must, MD;  Location: [facility];  Service: Orthopedics;  Laterality: Left;  . TUBAL LIGATION  1987  . VAGINAL HYSTERECTOMY  2009     Current Outpatient Prescriptions  Medication Sig Dispense Refill  . acetaminophen (TYLENOL) 500 MG tablet Take 1 tablet (500 mg total) by mouth every 8 (eight) hours as needed for mild pain, moderate pain or headache. 30 tablet 0  . AMBULATORY NON FORMULARY MEDICATION Take 90 mg by mouth 2 (two) times daily. Medication Name: BRILINTA 90 mg BID (TWILIGHT Research study PROVIDED)    . AMBULATORY NON FORMULARY MEDICATION Take 81 mg by mouth daily. Medication Name: Aspirin 81 mg daily (TWILIGHT Research Study PROVIDED)    . atorvastatin (LIPITOR) 80 MG tablet Take 1 tablet (80 mg total) by mouth daily at 6 PM. 30 tablet 6  . carvedilol (COREG) 3.125 MG tablet Take 1 tablet (3.125 mg total) by mouth 2 (two) times daily with a meal. 60 tablet 3  . LORazepam (ATIVAN) 0.5 MG tablet Take 1 tablet (0.5 mg total) by mouth every 12 (twelve) hours as needed for anxiety. DO NOT DRIVE WHEN TAKING THIS MEDICATION. 20 tablet 0  . losartan (COZAAR) 25 MG tablet Take 0.5 tablets (12.5 mg total) by mouth every evening. 15 tablet 3  . nitroGLYCERIN (NITROSTAT) 0.4 MG SL tablet Place 1 tablet (0.4 mg total) under the tongue every 5 (five) minutes x 3 doses as needed for chest pain. 25 tablet 3   No current facility-administered medications for this visit.     Allergies:   Patient has no known allergies.    Social History:  The patient  reports that she has quit smoking. Her smoking use included Cigarettes. She has a 38.00 pack-year smoking history. She has never used smokeless tobacco. She reports that she drinks about 3.6 oz of alcohol per week . She reports that she uses  drugs, including Marijuana.   Family History:  The patient's family history includes Diabetes in her mother; Heart failure in her other; Hypertension in her mother; Stroke in her mother.    ROS: All other systems are reviewed and negative. Unless otherwise mentioned in H&P    PHYSICAL EXAM: VS:  BP 108/76   Pulse 84   Ht 5' (1.524 m)   Wt 117 lb (53.1 kg)   SpO2 98%   BMI 22.85 kg/m  , BMI Body mass index is 22.85 kg/m. GEN: Well nourished, well developed, in no acute distress  HEENT: normal  Neck: no JVD, carotid bruits, or masses Cardiac: RRR; no murmurs, rubs, or gallops,no edema  Respiratory:  clear to auscultation bilaterally, normal work of breathing GI: soft, nontender, nondistended, + BS MS: no deformity or atrophy  Skin: warm and dry, no rash Neuro:  Strength and sensation are intact Psych: euthymic mood, tearful, depressed  Recent Labs: 03/13/2016: Magnesium 2.1 03/17/2016: B Natriuretic Peptide 221.0 03/24/2016: ALT 18; TSH 0.366 03/26/2016: BUN 9; Creatinine, Ser 0.74; Hemoglobin 9.5; Platelets 644;  Potassium 4.1; Sodium 137    Lipid Panel    Component Value Date/Time   CHOL 273 (H) 01/15/2016 0832   TRIG 371 (H) 01/15/2016 0832   HDL 53 01/15/2016 0832   CHOLHDL 5.2 (H) 01/15/2016 0832   VLDL 74 (H) 01/15/2016 0832   LDLCALC 146 (H) 01/15/2016 0832      Wt Readings from Last 3 Encounters:  03/28/16 117 lb (53.1 kg)  03/24/16 112 lb (50.8 kg)  03/17/16 115 lb 1.3 oz (52.2 kg)     ASSESSMENT AND PLAN:  1. Coronary artery disease: Status post hospitalization with cardiac catheterization on 03/14/2016, requiring drug-eluting stent of the right coronary artery and PDA 2. She remains on dual antiplatelet therapy, carvedilol 3.125 mg which was restarted during hospitalization at [facility] last week. She remains on ARB. She has not had to use nitroglycerin.  She is anxious about returning to work and her heart disease. She is very tearful when  speaking of it. I have given her a letter to return to work part time 20-25 hours a week with no heavy lifting, pushing or pulling. 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 We'll see her on close follow-up in 6 weeks  2. Hypercholesterolemia: Continue statin therapy. Will need follow-up labs within 3 months.  3. Ischemic cardiopathy: Remains on beta blocker, ARB. May consider changing to [facility] on follow-up appointment.  4. Anxiety and depression: I've advised her to seek professional attention for this. Possibly to [facility] behavior health, Dr. Durward Fortes, or [facility]. She verbalizes understanding and will discuss this with her primary care physician.   Current medicines are reviewed at length with the patient today.    Labs/ tests ordered today include:  No orders of the defined types were placed in this encounter.    Disposition:   FU with 6 weeks with cardiology Signed, Jory Sims, NP  03/28/2016 4:44 PM    [facility] [address] [place], [place] [address] Phone: [phone]; Fax: [phone]

## 2016-04-05 ENCOUNTER — Telehealth: Payer: Self-pay

## 2016-04-05 ENCOUNTER — Telehealth: Payer: Self-pay | Admitting: Adult Health

## 2016-04-05 NOTE — Telephone Encounter (Signed)
Ok to release to work full time as long as she continues to take medications as directed and not over exert herself, heavy lifting, pulling, pushing heavy patients.

## 2016-04-05 NOTE — Telephone Encounter (Signed)
Pt called to see if there is any possible way that she could get her work restrictions lifted. She is at risk of being fired from her job, due to the fact that she has not been employed there for 1 year and she was not hurt at work. She states that she has 7 kids to take care of and she cannot lose her job not her health insurance. She works at [employer] as a Psychologist, counselling and does not do any heavy lifting without help. She also states that she feels fine. Please advise.

## 2016-04-05 NOTE — Telephone Encounter (Signed)
Erin [name] is needing to speak with someone about her work restrictions. Her job will not allow her to come back to work with the restrictions she has right now and she is at risk of loosing her job. Please give her a call at [phone]

## 2016-04-06 NOTE — Telephone Encounter (Signed)
Called pt to let her know she may come to pick up her letter to return to work.

## 2016-04-13 ENCOUNTER — Encounter: Payer: Self-pay | Admitting: *Deleted

## 2016-04-13 DIAGNOSIS — Z006 Encounter for examination for normal comparison and control in clinical research program: Secondary | ICD-10-CM

## 2016-04-13 NOTE — Progress Notes (Signed)
TWILIGHT Research study month 1 telephone follow up completed. Patient denies any bleeding or other adverse events. I asked if she had any la work since discharge form hospital and she stated no. She states she has been compliant with research provided medication. Next research required visit is due no later than 18/feb/2018 and the research office will call to schedule. Questions encouraged and answered.

## 2016-04-15 ENCOUNTER — Telehealth: Payer: Self-pay | Admitting: Adult Health

## 2016-04-15 NOTE — Telephone Encounter (Signed)
Please give pt a call at work [phone]) [phone]  --she's had 2 knots come up on her arm and she's worried since she had a heart attack last month.

## 2016-04-15 NOTE — Telephone Encounter (Signed)
Pt not sure if they were areas where she had an IV or blood draws, or from the BP cuff was on her arm.She is not on any blood thinners.I told her to call back if things changed.

## 2016-04-19 ENCOUNTER — Encounter (HOSPITAL_COMMUNITY): Payer: Self-pay

## 2016-04-19 ENCOUNTER — Encounter (HOSPITAL_COMMUNITY)
Admission: RE | Admit: 2016-04-19 | Discharge: 2016-04-19 | Disposition: A | Discharge status: Home / Self Care | Payer: PRIVATE HEALTH INSURANCE | Source: Ambulatory Visit | Attending: Cardiology | Admitting: Cardiology

## 2016-04-19 VITALS — BP 106/74 | HR 92 | Ht 60.0 in | Wt 116.2 lb

## 2016-04-19 DIAGNOSIS — I214 Non-ST elevation (NSTEMI) myocardial infarction: Secondary | ICD-10-CM | POA: Insufficient documentation

## 2016-04-19 DIAGNOSIS — Z955 Presence of coronary angioplasty implant and graft: Secondary | ICD-10-CM

## 2016-04-19 NOTE — Progress Notes (Signed)
Cardiac Individual Treatment Plan  Patient Details  Name: Erin [name] MRN: [id number] Date of Birth: [birth year] Referring Provider:   Flowsheet Row CARDIAC REHAB PHASE II ORIENTATION from 04/19/2016 in [place]  Referring Provider  Dr. Radford Pax      Initial Encounter Date:  Flowsheet Row CARDIAC REHAB PHASE II ORIENTATION from 04/19/2016 in [place]  Date  04/19/16  Referring Provider  Dr. Radford Pax      Visit Diagnosis: NSTEMI (non-ST elevated myocardial infarction) [facility])  Status post coronary artery stent placement  Patient's Home Medications on Admission:  Current Outpatient Prescriptions:  .  acetaminophen (TYLENOL) 500 MG tablet, Take 1 tablet (500 mg total) by mouth every 8 (eight) hours as needed for mild pain, moderate pain or headache., Disp: 30 tablet, Rfl: 0 .  AMBULATORY NON FORMULARY MEDICATION, Take 90 mg by mouth 2 (two) times daily. Medication Name: BRILINTA 90 mg BID (TWILIGHT Research study PROVIDED), Disp: , Rfl:  .  AMBULATORY NON FORMULARY MEDICATION, Take 81 mg by mouth daily. Medication Name: Aspirin 81 mg daily (TWILIGHT Research Study PROVIDED), Disp: , Rfl:  .  atorvastatin (LIPITOR) 80 MG tablet, Take 1 tablet (80 mg total) by mouth daily at 6 PM., Disp: 30 tablet, Rfl: 6 .  carvedilol (COREG) 3.125 MG tablet, Take 1 tablet (3.125 mg total) by mouth 2 (two) times daily with a meal., Disp: 60 tablet, Rfl: 3 .  LORazepam (ATIVAN) 0.5 MG tablet, Take 1 tablet (0.5 mg total) by mouth every 12 (twelve) hours as needed for anxiety. DO NOT DRIVE WHEN TAKING THIS MEDICATION. (Patient taking differently: Take 1 mg by mouth at bedtime as needed for anxiety or sleep. DO NOT DRIVE WHEN TAKING THIS MEDICATION.), Disp: 20 tablet, Rfl: 0 .  losartan (COZAAR) 25 MG tablet, Take 0.5 tablets (12.5 mg total) by mouth every evening., Disp: 15 tablet, Rfl: 3 .  nitroGLYCERIN (NITROSTAT) 0.4 MG SL tablet, Place 1 tablet (0.4 mg  total) under the tongue every 5 (five) minutes x 3 doses as needed for chest pain., Disp: 25 tablet, Rfl: 3  Past Medical History: Past Medical History:  Diagnosis Date  . Anxiety state 03/25/2016  . Basal cell carcinoma of forehead   . Coronary artery disease    a. 03/11/16 PCI with DES-->Prox/Mid Cx, 03/14/16 PCI with DES x2-->RCA, EF 30-35%  . Essential hypertension   . GERD (gastroesophageal reflux disease)   . History of pneumonia   . Hyperlipidemia   . Ischemic cardiomyopathy   . Mitral regurgitation   . NSTEMI (non-ST elevated myocardial infarction) ([place]) 03/10/2016  . Squamous cell cancer of skin of nose   . Thrombocytosis ([place]) 03/26/2016  . Wears dentures   . Wears glasses     Tobacco Use: History  Smoking Status  . Former Smoker  . Packs/day: 1.00  . Years: 15.00  . Types: Cigarettes  Smokeless Tobacco  . Never Used    Comment: quit 2 weeks ago-03/24/16    Labs: Recent Review Flowsheet Data    Labs for ITP Cardiac and Pulmonary Rehab Latest Ref Rng & Units 09/06/2015 01/15/2016 03/11/2016   Cholestrol 125 - 200 mg/dL 229(H) 273(H) -   LDLCALC <130 mg/dL 130(H) 146(H) -   HDL >=46 mg/dL 34(L) 53 -   Trlycerides <150 mg/dL 324(H) 371(H) -   Hemoglobin A1c 4.8 - 5.6 % 5.7(H) - -   TCO2 0 - 100 mmol/L - - 22      Capillary Blood Glucose: No  results found for: GLUCAP   Exercise Target Goals: Date: 04/19/16  Exercise Program Goal: Individual exercise prescription set with THRR, safety & activity barriers. Participant demonstrates ability to understand and report RPE using BORG scale, to self-measure pulse accurately, and to acknowledge the importance of the exercise prescription.  Exercise Prescription Goal: Starting with aerobic activity 30 plus minutes a day, 3 days per week for initial exercise prescription. Provide home exercise prescription and guidelines that participant acknowledges understanding prior to discharge.  Activity Barriers & Risk  Stratification:     Activity Barriers & Cardiac Risk Stratification - 04/19/16 1015      Activity Barriers & Cardiac Risk Stratification   Activity Barriers None   Cardiac Risk Stratification High      6 Minute Walk:     6 Minute Walk    Row Name 04/19/16 1011         6 Minute Walk   Phase Initial     Distance 1250 feet     Distance % Change 0 %     Walk Time 6 minutes     # of Rest Breaks 0     MPH 2.36     METS 2.81     RPE 9     Perceived Dyspnea  13     VO2 Peak 13.15     Symptoms No     Resting HR 92 bpm     Resting BP 106/74     Max Ex. HR 108 bpm     Max Ex. BP 120/74     2 Minute Post BP 106/72        Initial Exercise Prescription:     Initial Exercise Prescription - 04/19/16 1000      Date of Initial Exercise RX and Referring Provider   Date 04/19/16   Referring Provider Dr. Radford Pax     Treadmill   MPH 1.3   Grade 0   Minutes 20   METs 1.9     NuStep   Level 2   Watts 10   Minutes 15   METs 1.6     Prescription Details   Frequency (times per week) 3   Duration Progress to 30 minutes of continuous aerobic without signs/symptoms of physical distress     Intensity   THRR REST +  30   THRR 40-80% of Max Heartrate [phone]   Ratings of Perceived Exertion 11-13   Perceived Dyspnea 0-4     Progression   Progression Continue progressive overload as per policy without signs/symptoms or physical distress.     Resistance Training   Training Prescription Yes   Weight 1   Reps 10-12      Perform Capillary Blood Glucose checks as needed.  Exercise Prescription Changes:   Exercise Comments:    Discharge Exercise Prescription (Final Exercise Prescription Changes):   Nutrition:  Target Goals: Understanding of nutrition guidelines, daily intake of sodium 1500mg , cholesterol 200mg , calories 30% from fat and 7% or less from saturated fats, daily to have 5 or more servings of fruits and vegetables.  Biometrics:     Pre  Biometrics - 04/19/16 1014      Pre Biometrics   Height 5' (1.524 m)   Weight 116 lb 2.9 oz (52.7 kg)   Waist Circumference 30 inches   Hip Circumference 34.5 inches   Waist to Hip Ratio 0.87 %   BMI (Calculated) 22.7   Triceps Skinfold 14 mm   % Body Fat 30.5 %  Grip Strength 44.3 kg   Flexibility 15.3 in   Single Leg Stand 14 seconds       Nutrition Therapy Plan and Nutrition Goals:   Nutrition Discharge: Rate Your Plate Scores:     Nutrition Assessments - 04/19/16 1021      MEDFICTS Scores   Pre Score 49      Nutrition Goals Re-Evaluation:   Psychosocial: Target Goals: Acknowledge presence or absence of depression, maximize coping skills, provide positive support system. Participant is able to verbalize types and ability to use techniques and skills needed for reducing stress and depression.  Initial Review & Psychosocial Screening:     Initial Psych Review & Screening - 04/19/16 1025      Initial Review   Current issues with Current Depression;Current Anxiety/Panic     Family Dynamics   Good Support System? Yes     Barriers   Psychosocial barriers to participate in program There are no identifiable barriers or psychosocial needs.  Patient is anxious about having another heart event, especially before bedtime. Concerned about her finances.      Screening Interventions   Interventions Encouraged to exercise      Quality of Life Scores:     Quality of Life - 04/19/16 1015      Quality of Life Scores   Health/Function Pre 10.87 %   Socioeconomic Pre 22 %   Psych/Spiritual Pre 16.29 %   Family Pre 20.4 %   GLOBAL Pre 15.68 %      PHQ-9: Recent Review Flowsheet Data    Depression screen [facility] 2/9 04/19/2016   Decreased Interest 2   Down, Depressed, Hopeless 3   PHQ - 2 Score 5   Altered sleeping 3   Tired, decreased energy 3   Change in appetite 3   Feeling bad or failure about yourself  3   Trouble concentrating 3   Moving slowly or  fidgety/restless 0   Suicidal thoughts 0   PHQ-9 Score 20   Difficult doing work/chores Somewhat difficult      Psychosocial Evaluation and Intervention:     Psychosocial Evaluation - 04/19/16 1026      Psychosocial Evaluation & Interventions   Interventions Relaxation education;Stress management education   Continued Psychosocial Services Needed Yes      Psychosocial Re-Evaluation:   Vocational Rehabilitation: Provide vocational rehab assistance to qualifying candidates.   Vocational Rehab Evaluation & Intervention:     Vocational Rehab - 04/19/16 1019      Initial Vocational Rehab Evaluation & Intervention   Assessment shows need for Vocational Rehabilitation No      Education: Education Goals: Education classes will be provided on a weekly basis, covering required topics. Participant will state understanding/return demonstration of topics presented.  Learning Barriers/Preferences:     Learning Barriers/Preferences - 04/19/16 1015      Learning Barriers/Preferences   Learning Barriers None   Learning Preferences Video;Written Material;Pictoral;Computer/Internet      Education Topics: Hypertension, Hypertension Reduction -Define heart disease and high blood pressure. Discus how high blood pressure affects the body and ways to reduce high blood pressure.   Exercise and Your Heart -Discuss why it is important to exercise, the FITT principles of exercise, normal and abnormal responses to exercise, and how to exercise safely.   Angina -Discuss definition of angina, causes of angina, treatment of angina, and how to decrease risk of having angina.   Cardiac Medications -Review what the following cardiac medications are used for, how they affect the  body, and side effects that may occur when taking the medications.  Medications include Aspirin, Beta blockers, calcium channel blockers, ACE Inhibitors, angiotensin receptor blockers, diuretics, digoxin, and  antihyperlipidemics.   Congestive Heart Failure -Discuss the definition of CHF, how to live with CHF, the signs and symptoms of CHF, and how keep track of weight and sodium intake.   Heart Disease and Intimacy -Discus the effect sexual activity has on the heart, how changes occur during intimacy as we age, and safety during sexual activity.   Smoking Cessation / COPD -Discuss different methods to quit smoking, the health benefits of quitting smoking, and the definition of COPD.   Nutrition I: Fats -Discuss the types of cholesterol, what cholesterol does to the heart, and how cholesterol levels can be controlled.   Nutrition II: Labels -Discuss the different components of food labels and how to read food label   Heart Parts and Heart Disease -Discuss the anatomy of the heart, the pathway of blood circulation through the heart, and these are affected by heart disease.   Stress I: Signs and Symptoms -Discuss the causes of stress, how stress may lead to anxiety and depression, and ways to limit stress.   Stress II: Relaxation -Discuss different types of relaxation techniques to limit stress.   Warning Signs of Stroke / TIA -Discuss definition of a stroke, what the signs and symptoms are of a stroke, and how to identify when someone is having stroke.   Knowledge Questionnaire Score:     Knowledge Questionnaire Score - 04/19/16 1019      Knowledge Questionnaire Score   Pre Score 22/28      Core Components/Risk Factors/Patient Goals at Admission:     Personal Goals and Risk Factors at Admission - 04/19/16 1021      Core Components/Risk Factors/Patient Goals on Admission    Weight Management Weight Maintenance   Increase Strength and Stamina Yes   Intervention Provide advice, education, support and counseling about physical activity/exercise needs.;Develop an individualized exercise prescription for aerobic and resistive training based on initial evaluation findings,  risk stratification, comorbidities and participant's personal goals.   Expected Outcomes Achievement of increased cardiorespiratory fitness and enhanced flexibility, muscular endurance and strength shown through measurements of functional capacity and personal statement of participant.   Tobacco Cessation Yes   Intervention Assist the participant in steps to quit. Provide individualized education and counseling about committing to Tobacco Cessation, relapse prevention, and pharmacological support that can be provided by physician.;Advice worker, assist with locating and accessing local/national Quit Smoking programs, and support quit date choice.   Expected Outcomes Long Term: Complete abstinence from all tobacco products for at least 12 months from quit date.;Short Term: Will demonstrate readiness to quit, by selecting a quit date.   Stress Yes   Intervention Offer individual and/or small group education and counseling on adjustment to heart disease, stress management and health-related lifestyle change. Teach and support self-help strategies.;Refer participants experiencing significant psychosocial distress to appropriate mental health specialists for further evaluation and treatment. When possible, include family members and significant others in education/counseling sessions.   Expected Outcomes Short Term: Participant demonstrates changes in health-related behavior, relaxation and other stress management skills, ability to obtain effective social support, and compliance with psychotropic medications if prescribed.;Long Term: Emotional wellbeing is indicated by absence of clinically significant psychosocial distress or social isolation.   Personal Goal Gain Strength, be where I was before event.    Intervention Attend CR 3 x week and supplement 2  x week at home.    Expected Outcomes Reach personal goals.       Core Components/Risk Factors/Patient Goals Review:      Goals and Risk  Factor Review    Row Name 04/19/16 1024             Core Components/Risk Factors/Patient Goals Review   Personal Goals Review Increase Strength and Stamina;Stress;Tobacco Cessation          Core Components/Risk Factors/Patient Goals at Discharge (Final Review):      Goals and Risk Factor Review - 04/19/16 1024      Core Components/Risk Factors/Patient Goals Review   Personal Goals Review Increase Strength and Stamina;Stress;Tobacco Cessation      ITP Comments:     ITP Comments    Row Name 04/19/16 1104           ITP Comments Erin [name] is still smoking just 1/2 cigarette/day. Will give her information about our somoking cessation program.           Comments: Patient arrived for 1st visit/orientation/education at 0800. Patient was referred to CR by Dr. Fransico Him due to NSTEMI (I21.4) and Stent Placement (Z95.5). During orientation advised patient on arrival and appointment times what to wear, what to do before, during and after exercise. Reviewed attendance and class policy. Talked about inclement weather and class consultation policy. Pt is scheduled to return Cardiac Rehab on 04/25/16 at 1545. Pt was advised to come to class 15 minutes before class starts. Patient was also given instructions on meeting with the dietician and attending the Family Structure classes. Pt is eager to get started. Patient participated in warm up stretches followed by light weights and resistance bands. Patient was then able to complete 6 minute walk test. Patient was measured for the equipment. Discussed equipment safety with patient. Took patient pre-anthropometric measurements. Patient finished visit at 10:00.

## 2016-04-19 NOTE — Progress Notes (Signed)
Cardiac/Pulmonary Rehab Medication Review by a Pharmacist  Does the patient  feel that his/her medications are working for him/her?  yes  Has the patient been experiencing any side effects to the medications prescribed?  no  Does the patient measure his/her own blood pressure or blood glucose at home?  yes   Does the patient have any problems obtaining medications due to transportation or finances?   no  Understanding of regimen: excellent Understanding of indications: excellent Potential of compliance: excellent  Questions asked to Determine Patient Understanding of Medication Regimen:  1. What is the name of the medication?  2. What is the medication used for?  3. When should it be taken?  4. How much should be taken?  5. How will you take it?  6. What side effects should you report?  Understanding Defined as: Excellent: All questions above are correct Good: Questions 1-4 are correct Fair: Questions 1-2 are correct  Poor: 1 or none of the above questions are correct   Pharmacist comments: Pt states she does check her BP at home.  Does not c/o any side effects from medications.  Pt takes Losartan 1/2 tab and recommended pill cutter since tabs not scored.    Hart Robinsons A 04/19/2016 9:28 AM

## 2016-04-20 NOTE — Progress Notes (Signed)
Cardiac Individual Treatment Plan  Patient Details  Name: Erin [name] MRN: [id number] Date of Birth: Aug 12, 1960 Referring Provider:   Flowsheet Row CARDIAC REHAB PHASE II ORIENTATION from 04/19/2016 in [place]  Referring Provider  Dr. Radford Pax      Initial Encounter Date:  Flowsheet Row CARDIAC REHAB PHASE II ORIENTATION from 04/19/2016 in [place]  Date  04/19/16  Referring Provider  Dr. Radford Pax      Visit Diagnosis: NSTEMI (non-ST elevated myocardial infarction) [facility])  Status post coronary artery stent placement  Patient's Home Medications on Admission:  Current Outpatient Prescriptions:  .  acetaminophen (TYLENOL) 500 MG tablet, Take 1 tablet (500 mg total) by mouth every 8 (eight) hours as needed for mild pain, moderate pain or headache., Disp: 30 tablet, Rfl: 0 .  AMBULATORY NON FORMULARY MEDICATION, Take 90 mg by mouth 2 (two) times daily. Medication Name: BRILINTA 90 mg BID (TWILIGHT Research study PROVIDED), Disp: , Rfl:  .  AMBULATORY NON FORMULARY MEDICATION, Take 81 mg by mouth daily. Medication Name: Aspirin 81 mg daily (TWILIGHT Research Study PROVIDED), Disp: , Rfl:  .  atorvastatin (LIPITOR) 80 MG tablet, Take 1 tablet (80 mg total) by mouth daily at 6 PM., Disp: 30 tablet, Rfl: 6 .  carvedilol (COREG) 3.125 MG tablet, Take 1 tablet (3.125 mg total) by mouth 2 (two) times daily with a meal., Disp: 60 tablet, Rfl: 3 .  LORazepam (ATIVAN) 0.5 MG tablet, Take 1 tablet (0.5 mg total) by mouth every 12 (twelve) hours as needed for anxiety. DO NOT DRIVE WHEN TAKING THIS MEDICATION. (Patient taking differently: Take 1 mg by mouth at bedtime as needed for anxiety or sleep. DO NOT DRIVE WHEN TAKING THIS MEDICATION.), Disp: 20 tablet, Rfl: 0 .  losartan (COZAAR) 25 MG tablet, Take 0.5 tablets (12.5 mg total) by mouth every evening., Disp: 15 tablet, Rfl: 3 .  nitroGLYCERIN (NITROSTAT) 0.4 MG SL tablet, Place 1 tablet (0.4 mg  total) under the tongue every 5 (five) minutes x 3 doses as needed for chest pain., Disp: 25 tablet, Rfl: 3  Past Medical History: Past Medical History:  Diagnosis Date  . Anxiety state 03/25/2016  . Basal cell carcinoma of forehead   . Coronary artery disease    a. 03/11/16 PCI with DES-->Prox/Mid Cx, 03/14/16 PCI with DES x2-->RCA, EF 30-35%  . Essential hypertension   . GERD (gastroesophageal reflux disease)   . History of pneumonia   . Hyperlipidemia   . Ischemic cardiomyopathy   . Mitral regurgitation   . NSTEMI (non-ST elevated myocardial infarction) ([place]) 03/10/2016  . Squamous cell cancer of skin of nose   . Thrombocytosis ([place]) 03/26/2016  . Wears dentures   . Wears glasses     Tobacco Use: History  Smoking Status  . Former Smoker  . Packs/day: 1.00  . Years: 15.00  . Types: Cigarettes  Smokeless Tobacco  . Never Used    Comment: quit 2 weeks ago-03/24/16    Labs: Recent Review Flowsheet Data    Labs for ITP Cardiac and Pulmonary Rehab Latest Ref Rng & Units 09/06/2015 01/15/2016 03/11/2016   Cholestrol 125 - 200 mg/dL 229(H) 273(H) -   LDLCALC <130 mg/dL 130(H) 146(H) -   HDL >=46 mg/dL 34(L) 53 -   Trlycerides <150 mg/dL 324(H) 371(H) -   Hemoglobin A1c 4.8 - 5.6 % 5.7(H) - -   TCO2 0 - 100 mmol/L - - 22      Capillary Blood Glucose: No  results found for: GLUCAP   Exercise Target Goals: Date: 04/19/16  Exercise Program Goal: Individual exercise prescription set with THRR, safety & activity barriers. Participant demonstrates ability to understand and report RPE using BORG scale, to self-measure pulse accurately, and to acknowledge the importance of the exercise prescription.  Exercise Prescription Goal: Starting with aerobic activity 30 plus minutes a day, 3 days per week for initial exercise prescription. Provide home exercise prescription and guidelines that participant acknowledges understanding prior to discharge.  Activity Barriers & Risk  Stratification:     Activity Barriers & Cardiac Risk Stratification - 04/19/16 1015      Activity Barriers & Cardiac Risk Stratification   Activity Barriers None   Cardiac Risk Stratification High      6 Minute Walk:     6 Minute Walk    Row Name 04/19/16 1011         6 Minute Walk   Phase Initial     Distance 1250 feet     Distance % Change 0 %     Walk Time 6 minutes     # of Rest Breaks 0     MPH 2.36     METS 2.81     RPE 9     Perceived Dyspnea  13     VO2 Peak 13.15     Symptoms No     Resting HR 92 bpm     Resting BP 106/74     Max Ex. HR 108 bpm     Max Ex. BP 120/74     2 Minute Post BP 106/72        Initial Exercise Prescription:     Initial Exercise Prescription - 04/19/16 1000      Date of Initial Exercise RX and Referring Provider   Date 04/19/16   Referring Provider Dr. Radford Pax     Treadmill   MPH 1.3   Grade 0   Minutes 20   METs 1.9     NuStep   Level 2   Watts 10   Minutes 15   METs 1.6     Prescription Details   Frequency (times per week) 3   Duration Progress to 30 minutes of continuous aerobic without signs/symptoms of physical distress     Intensity   THRR REST +  30   THRR 40-80% of Max Heartrate [phone]   Ratings of Perceived Exertion 11-13   Perceived Dyspnea 0-4     Progression   Progression Continue progressive overload as per policy without signs/symptoms or physical distress.     Resistance Training   Training Prescription Yes   Weight 1   Reps 10-12      Perform Capillary Blood Glucose checks as needed.  Exercise Prescription Changes:   Exercise Comments:    Discharge Exercise Prescription (Final Exercise Prescription Changes):   Nutrition:  Target Goals: Understanding of nutrition guidelines, daily intake of sodium 1500mg , cholesterol 200mg , calories 30% from fat and 7% or less from saturated fats, daily to have 5 or more servings of fruits and vegetables.  Biometrics:     Pre  Biometrics - 04/19/16 1014      Pre Biometrics   Height 5' (1.524 m)   Weight 116 lb 2.9 oz (52.7 kg)   Waist Circumference 30 inches   Hip Circumference 34.5 inches   Waist to Hip Ratio 0.87 %   BMI (Calculated) 22.7   Triceps Skinfold 14 mm   % Body Fat 30.5 %  Grip Strength 44.3 kg   Flexibility 15.3 in   Single Leg Stand 14 seconds       Nutrition Therapy Plan and Nutrition Goals:   Nutrition Discharge: Rate Your Plate Scores:     Nutrition Assessments - 04/19/16 1021      MEDFICTS Scores   Pre Score 49      Nutrition Goals Re-Evaluation:   Psychosocial: Target Goals: Acknowledge presence or absence of depression, maximize coping skills, provide positive support system. Participant is able to verbalize types and ability to use techniques and skills needed for reducing stress and depression.  Initial Review & Psychosocial Screening:     Initial Psych Review & Screening - 04/19/16 1025      Initial Review   Current issues with Current Depression;Current Anxiety/Panic     Family Dynamics   Good Support System? Yes     Barriers   Psychosocial barriers to participate in program There are no identifiable barriers or psychosocial needs.  Patient is anxious about having another heart event, especially before bedtime. Concerned about her finances.      Screening Interventions   Interventions Encouraged to exercise      Quality of Life Scores:     Quality of Life - 04/19/16 1015      Quality of Life Scores   Health/Function Pre 10.87 %   Socioeconomic Pre 22 %   Psych/Spiritual Pre 16.29 %   Family Pre 20.4 %   GLOBAL Pre 15.68 %      PHQ-9: Recent Review Flowsheet Data    Depression screen [facility] 2/9 04/19/2016   Decreased Interest 2   Down, Depressed, Hopeless 3   PHQ - 2 Score 5   Altered sleeping 3   Tired, decreased energy 3   Change in appetite 3   Feeling bad or failure about yourself  3   Trouble concentrating 3   Moving slowly or  fidgety/restless 0   Suicidal thoughts 0   PHQ-9 Score 20   Difficult doing work/chores Somewhat difficult      Psychosocial Evaluation and Intervention:     Psychosocial Evaluation - 04/19/16 1026      Psychosocial Evaluation & Interventions   Interventions Relaxation education;Stress management education   Continued Psychosocial Services Needed Yes      Psychosocial Re-Evaluation:   Vocational Rehabilitation: Provide vocational rehab assistance to qualifying candidates.   Vocational Rehab Evaluation & Intervention:     Vocational Rehab - 04/19/16 1019      Initial Vocational Rehab Evaluation & Intervention   Assessment shows need for Vocational Rehabilitation No      Education: Education Goals: Education classes will be provided on a weekly basis, covering required topics. Participant will state understanding/return demonstration of topics presented.  Learning Barriers/Preferences:     Learning Barriers/Preferences - 04/19/16 1015      Learning Barriers/Preferences   Learning Barriers None   Learning Preferences Video;Written Material;Pictoral;Computer/Internet      Education Topics: Hypertension, Hypertension Reduction -Define heart disease and high blood pressure. Discus how high blood pressure affects the body and ways to reduce high blood pressure.   Exercise and Your Heart -Discuss why it is important to exercise, the FITT principles of exercise, normal and abnormal responses to exercise, and how to exercise safely.   Angina -Discuss definition of angina, causes of angina, treatment of angina, and how to decrease risk of having angina.   Cardiac Medications -Review what the following cardiac medications are used for, how they affect the  body, and side effects that may occur when taking the medications.  Medications include Aspirin, Beta blockers, calcium channel blockers, ACE Inhibitors, angiotensin receptor blockers, diuretics, digoxin, and  antihyperlipidemics.   Congestive Heart Failure -Discuss the definition of CHF, how to live with CHF, the signs and symptoms of CHF, and how keep track of weight and sodium intake.   Heart Disease and Intimacy -Discus the effect sexual activity has on the heart, how changes occur during intimacy as we age, and safety during sexual activity.   Smoking Cessation / COPD -Discuss different methods to quit smoking, the health benefits of quitting smoking, and the definition of COPD.   Nutrition I: Fats -Discuss the types of cholesterol, what cholesterol does to the heart, and how cholesterol levels can be controlled.   Nutrition II: Labels -Discuss the different components of food labels and how to read food label   Heart Parts and Heart Disease -Discuss the anatomy of the heart, the pathway of blood circulation through the heart, and these are affected by heart disease.   Stress I: Signs and Symptoms -Discuss the causes of stress, how stress may lead to anxiety and depression, and ways to limit stress.   Stress II: Relaxation -Discuss different types of relaxation techniques to limit stress.   Warning Signs of Stroke / TIA -Discuss definition of a stroke, what the signs and symptoms are of a stroke, and how to identify when someone is having stroke.   Knowledge Questionnaire Score:     Knowledge Questionnaire Score - 04/19/16 1019      Knowledge Questionnaire Score   Pre Score 22/28      Core Components/Risk Factors/Patient Goals at Admission:     Personal Goals and Risk Factors at Admission - 04/19/16 1021      Core Components/Risk Factors/Patient Goals on Admission    Weight Management Weight Maintenance   Increase Strength and Stamina Yes   Intervention Provide advice, education, support and counseling about physical activity/exercise needs.;Develop an individualized exercise prescription for aerobic and resistive training based on initial evaluation findings,  risk stratification, comorbidities and participant's personal goals.   Expected Outcomes Achievement of increased cardiorespiratory fitness and enhanced flexibility, muscular endurance and strength shown through measurements of functional capacity and personal statement of participant.   Tobacco Cessation Yes   Intervention Assist the participant in steps to quit. Provide individualized education and counseling about committing to Tobacco Cessation, relapse prevention, and pharmacological support that can be provided by physician.;Advice worker, assist with locating and accessing local/national Quit Smoking programs, and support quit date choice.   Expected Outcomes Long Term: Complete abstinence from all tobacco products for at least 12 months from quit date.;Short Term: Will demonstrate readiness to quit, by selecting a quit date.   Stress Yes   Intervention Offer individual and/or small group education and counseling on adjustment to heart disease, stress management and health-related lifestyle change. Teach and support self-help strategies.;Refer participants experiencing significant psychosocial distress to appropriate mental health specialists for further evaluation and treatment. When possible, include family members and significant others in education/counseling sessions.   Expected Outcomes Short Term: Participant demonstrates changes in health-related behavior, relaxation and other stress management skills, ability to obtain effective social support, and compliance with psychotropic medications if prescribed.;Long Term: Emotional wellbeing is indicated by absence of clinically significant psychosocial distress or social isolation.   Personal Goal Gain Strength, be where I was before event.    Intervention Attend CR 3 x week and supplement 2  x week at home.    Expected Outcomes Reach personal goals.       Core Components/Risk Factors/Patient Goals Review:      Goals and Risk  Factor Review    Row Name 04/19/16 1024             Core Components/Risk Factors/Patient Goals Review   Personal Goals Review Increase Strength and Stamina;Stress;Tobacco Cessation          Core Components/Risk Factors/Patient Goals at Discharge (Final Review):      Goals and Risk Factor Review - 04/19/16 1024      Core Components/Risk Factors/Patient Goals Review   Personal Goals Review Increase Strength and Stamina;Stress;Tobacco Cessation      ITP Comments:     ITP Comments    Row Name 04/19/16 1104 04/20/16 1322         ITP Comments Erin [name] is still smoking just 1/2 cigarette/day. Will give her information about our somoking cessation program.  Patient new to program. Will start 04/25/16.         Comments: .ITP 30 Day REVIEW Patient new to program. Will start 04/25/16.

## 2016-04-25 ENCOUNTER — Encounter (HOSPITAL_COMMUNITY)
Admission: RE | Admit: 2016-04-25 | Discharge: 2016-04-25 | Disposition: A | Discharge status: Home / Self Care | Payer: PRIVATE HEALTH INSURANCE | Source: Ambulatory Visit | Attending: Cardiology | Admitting: Cardiology

## 2016-04-25 DIAGNOSIS — I214 Non-ST elevation (NSTEMI) myocardial infarction: Secondary | ICD-10-CM | POA: Diagnosis not present

## 2016-04-25 DIAGNOSIS — Z955 Presence of coronary angioplasty implant and graft: Secondary | ICD-10-CM

## 2016-04-25 NOTE — Progress Notes (Signed)
Daily Session Note  Patient Details  Name: Erin [name] MRN: [id number] Date of Birth: 03-01-1961 Referring Provider:   Flowsheet Row CARDIAC REHAB PHASE II ORIENTATION from 04/19/2016 in [place]  Referring Provider  Dr. Radford Pax      Encounter Date: 04/25/2016  Check In:     Session Check In - 04/25/16 1630      Check-In   Location [facility] Pul[facility] Re[facility]   Staff Present Tiersa Dayley Angelina Pih, MS, EP, [facility], Exercise Physiologist;Debra Wynetta Emery, RN, BSN   Supervising physician immediately available to respond to emergencies See telemetry face sheet for immediately available MD   Medication changes reported     No   Fall or balance concerns reported    No   Warm-up and Cool-down Performed as group-led instruction   Resistance Training Performed Yes   VAD Patient? No     Pain Assessment   Currently in Pain? No/denies   Pain Score 0-No pain   Multiple Pain Sites No      Capillary Blood Glucose: No results found for this or any previous visit (from the past 24 hour(s)).   Goals Met:  Independence with exercise equipment Using PLB without cueing & demonstrates good technique Exercise tolerated well Queuing for purse lip breathing No report of cardiac concerns or symptoms Strength training completed today  Goals Unmet:  Not Applicable  Comments: Check out 4:45    Dr. Kate Sable is Medical Director for [place] and Pulmonary Rehab.

## 2016-04-27 ENCOUNTER — Encounter (HOSPITAL_COMMUNITY)
Admission: RE | Admit: 2016-04-27 | Discharge: 2016-04-27 | Disposition: A | Discharge status: Home / Self Care | Payer: PRIVATE HEALTH INSURANCE | Source: Ambulatory Visit | Attending: Cardiology | Admitting: Cardiology

## 2016-04-27 DIAGNOSIS — Z955 Presence of coronary angioplasty implant and graft: Secondary | ICD-10-CM | POA: Diagnosis not present

## 2016-04-27 DIAGNOSIS — I214 Non-ST elevation (NSTEMI) myocardial infarction: Secondary | ICD-10-CM

## 2016-04-27 NOTE — Progress Notes (Signed)
Daily Session Note  Patient Details  Name: Erin [name] MRN: [id number] Date of Birth: [birth year] Referring Provider:   Flowsheet Row CARDIAC REHAB PHASE II ORIENTATION from 04/19/2016 in [place]  Referring Provider  Dr. Radford Pax      Encounter Date: 04/27/2016  Check In:     Session Check In - 04/27/16 1545      Check-In   Location [facility]   Staff Present Diane Angelina Pih, MS, EP, [facility], Exercise Physiologist;Yusuke Beza Wynetta Emery, RN, BSN   Supervising physician immediately available to respond to emergencies See telemetry face sheet for immediately available MD   Medication changes reported     No   Fall or balance concerns reported    No   Warm-up and Cool-down Performed as group-led instruction   Resistance Training Performed Yes   VAD Patient? No     Pain Assessment   Currently in Pain? No/denies   Pain Score 0-No pain   Multiple Pain Sites No      Capillary Blood Glucose: No results found for this or any previous visit (from the past 24 hour(s)).   Goals Met:  Independence with exercise equipment Exercise tolerated well No report of cardiac concerns or symptoms Strength training completed today  Goals Unmet:  Not Applicable  Comments: Check out 1645.   Dr. Kate Sable is Medical Director for [facility] Cardiac and Pulmonary Rehab.

## 2016-04-29 ENCOUNTER — Encounter (HOSPITAL_COMMUNITY): Payer: PRIVATE HEALTH INSURANCE

## 2016-05-02 ENCOUNTER — Encounter (HOSPITAL_COMMUNITY): Payer: PRIVATE HEALTH INSURANCE

## 2016-05-04 ENCOUNTER — Encounter (HOSPITAL_COMMUNITY)
Admission: RE | Admit: 2016-05-04 | Discharge: 2016-05-04 | Disposition: A | Discharge status: Home / Self Care | Payer: PRIVATE HEALTH INSURANCE | Source: Ambulatory Visit | Attending: Cardiology | Admitting: Cardiology

## 2016-05-04 DIAGNOSIS — Z955 Presence of coronary angioplasty implant and graft: Secondary | ICD-10-CM | POA: Diagnosis not present

## 2016-05-04 DIAGNOSIS — I214 Non-ST elevation (NSTEMI) myocardial infarction: Secondary | ICD-10-CM

## 2016-05-04 NOTE — Progress Notes (Signed)
Daily Session Note  Patient Details  Name: Erin [name] MRN: [id number] Date of Birth: 1960/10/03 Referring Provider:   Flowsheet Row CARDIAC REHAB PHASE II ORIENTATION from 04/19/2016 in [place]  Referring Provider  Dr. Radford Pax      Encounter Date: 05/04/2016  Check In:     Session Check In - 05/04/16 1545      Check-In   Location [facility]   Staff Present Diane Angelina Pih, MS, EP, [facility], Exercise Physiologist;Zerek Litsey Wynetta Emery, RN, BSN   Supervising physician immediately available to respond to emergencies See telemetry face sheet for immediately available MD   Medication changes reported     No   Fall or balance concerns reported    No   Warm-up and Cool-down Performed as group-led instruction   Resistance Training Performed Yes   VAD Patient? No     Pain Assessment   Currently in Pain? No/denies   Pain Score 0-No pain   Multiple Pain Sites No      Capillary Blood Glucose: No results found for this or any previous visit (from the past 24 hour(s)).   Goals Met:  Independence with exercise equipment Exercise tolerated well No report of cardiac concerns or symptoms Strength training completed today  Goals Unmet:  Not Applicable  Comments: Check out 1645.   Dr. Kate Sable is Medical Director for [facility] Cardiac and Pulmonary Rehab.

## 2016-05-06 ENCOUNTER — Encounter (HOSPITAL_COMMUNITY)
Admission: RE | Admit: 2016-05-06 | Discharge: 2016-05-06 | Disposition: A | Discharge status: Home / Self Care | Payer: PRIVATE HEALTH INSURANCE | Source: Ambulatory Visit | Attending: Cardiology | Admitting: Cardiology

## 2016-05-06 DIAGNOSIS — I214 Non-ST elevation (NSTEMI) myocardial infarction: Secondary | ICD-10-CM

## 2016-05-06 DIAGNOSIS — Z955 Presence of coronary angioplasty implant and graft: Secondary | ICD-10-CM | POA: Diagnosis not present

## 2016-05-06 NOTE — Progress Notes (Signed)
Daily Session Note  Patient Details  Name: HEAVEN MEEKER MRN: [id number] Date of Birth: 04/08/1961 Referring Provider:   Flowsheet Row CARDIAC REHAB PHASE II ORIENTATION from 04/19/2016 in [place]  Referring Provider  Dr. Radford Pax      Encounter Date: 05/06/2016  Check In:     Session Check In - 05/06/16 1300      Check-In   Location [facility]   Staff Present Diane Angelina Pih, MS, EP, [facility], Exercise Physiologist;Debra Wynetta Emery, RN, BSN;Kimo Bancroft, BS, EP, Exercise Physiologist   Supervising physician immediately available to respond to emergencies See telemetry face sheet for immediately available MD   Medication changes reported     No   Fall or balance concerns reported    No   Warm-up and Cool-down Performed as group-led instruction   Resistance Training Performed Yes   VAD Patient? No     Pain Assessment   Currently in Pain? No/denies   Pain Score 0-No pain   Multiple Pain Sites No      Capillary Blood Glucose: No results found for this or any previous visit (from the past 24 hour(s)).   Goals Met:  Independence with exercise equipment Exercise tolerated well No report of cardiac concerns or symptoms Strength training completed today  Goals Unmet:  Not Applicable  Comments: Check out 200   Dr. Kate Sable is Medical Director for [place] and Pulmonary Rehab.

## 2016-05-09 ENCOUNTER — Encounter (HOSPITAL_COMMUNITY): Payer: PRIVATE HEALTH INSURANCE

## 2016-05-10 ENCOUNTER — Ambulatory Visit: Payer: Self-pay | Admitting: Adult Health

## 2016-05-10 NOTE — Progress Notes (Deleted)
Cardiology Office Note   Date:  05/10/2016   ID:  Brindy, Izbicki 12-May-1960, MRN [id number]  PCP:  Jonathon Bellows, MD  Cardiologist:   Jory Sims, NP   No chief complaint on file.     History of Present Illness: Erin [name] is a 56 y.o. female who presents for coronary artery disease, stent to the mid circumflex, and right coronary artery, most recent echocardiogram in November 2017 revealed an EF of 30-35%, Ongoing complaints of chronic shortness of breath, anxiety, with recurrent chest pain as a result of anxiety. During recent cardiac rehabilitation evaluation the patient complained of pain in her chest which she describes as feeling as if she had breathed in a lot of cold air. Vital signs were stable, she was able to complete exercise    Past Medical History:  Diagnosis Date  . Anxiety state 03/25/2016  . Basal cell carcinoma of forehead   . Coronary artery disease    a. 03/11/16 PCI with DES-->Prox/Mid Cx, 03/14/16 PCI with DES x2-->RCA, EF 30-35%  . Essential hypertension   . GERD (gastroesophageal reflux disease)   . History of pneumonia   . Hyperlipidemia   . Ischemic cardiomyopathy   . Mitral regurgitation   . NSTEMI (non-ST elevated myocardial infarction) ([place]) 03/10/2016  . Squamous cell cancer of skin of nose   . Thrombocytosis ([place]) 03/26/2016  . Wears dentures   . Wears glasses     Past Surgical History:  Procedure Laterality Date  . APPENDECTOMY    . CARDIAC CATHETERIZATION N/A 03/11/2016   Procedure: Left Heart Cath and Coronary Angiography;  Surgeon: Leonie Man, MD;  Location: [facility];  Service: Cardiovascular;  Laterality: N/A;  . CARDIAC CATHETERIZATION N/A 03/11/2016   Procedure: Coronary Stent Intervention;  Surgeon: Leonie Man, MD;  Location: [facility];  Service: Cardiovascular;  Laterality: N/A;  . CARDIAC CATHETERIZATION N/A 03/14/2016   Procedure: Coronary Stent Intervention;  Surgeon: Peter M Martinique, MD;   Location: [facility];  Service: Cardiovascular;  Laterality: N/A;  . CHOLECYSTECTOMY OPEN  1984  . CORONARY ANGIOPLASTY WITH STENT PLACEMENT  03/14/2016  . FINGER ARTHROPLASTY Left 05/14/2013   Procedure: LEFT THUMB CARPAL METACARPAL ARTHROPLASTY;  Surgeon: Tennis Must, MD;  Location: [facility];  Service: Orthopedics;  Laterality: Left;  . TUBAL LIGATION  1987  . VAGINAL HYSTERECTOMY  2009     Current Outpatient Prescriptions  Medication Sig Dispense Refill  . acetaminophen (TYLENOL) 500 MG tablet Take 1 tablet (500 mg total) by mouth every 8 (eight) hours as needed for mild pain, moderate pain or headache. 30 tablet 0  . AMBULATORY NON FORMULARY MEDICATION Take 90 mg by mouth 2 (two) times daily. Medication Name: BRILINTA 90 mg BID (TWILIGHT Research study PROVIDED)    . AMBULATORY NON FORMULARY MEDICATION Take 81 mg by mouth daily. Medication Name: Aspirin 81 mg daily (TWILIGHT Research Study PROVIDED)    . atorvastatin (LIPITOR) 80 MG tablet Take 1 tablet (80 mg total) by mouth daily at 6 PM. 30 tablet 6  . carvedilol (COREG) 3.125 MG tablet Take 1 tablet (3.125 mg total) by mouth 2 (two) times daily with a meal. 60 tablet 3  . LORazepam (ATIVAN) 0.5 MG tablet Take 1 tablet (0.5 mg total) by mouth every 12 (twelve) hours as needed for anxiety. DO NOT DRIVE WHEN TAKING THIS MEDICATION. (Patient taking differently: Take 1 mg by mouth at bedtime as needed for anxiety  or sleep. DO NOT DRIVE WHEN TAKING THIS MEDICATION.) 20 tablet 0  . losartan (COZAAR) 25 MG tablet Take 0.5 tablets (12.5 mg total) by mouth every evening. 15 tablet 3  . nitroGLYCERIN (NITROSTAT) 0.4 MG SL tablet Place 1 tablet (0.4 mg total) under the tongue every 5 (five) minutes x 3 doses as needed for chest pain. 25 tablet 3   No current facility-administered medications for this visit.     Allergies:   Patient has no known allergies.    Social History:  The patient  reports that she has quit  smoking. Her smoking use included Cigarettes. She has a 15.00 pack-year smoking history. She has never used smokeless tobacco. She reports that she drinks about 3.6 oz of alcohol per week . She reports that she uses drugs, including Marijuana.   Family History:  The patient's family history includes Diabetes in her mother; Heart failure in her other; Hypertension in her mother; Stroke in her mother.    ROS: All other systems are reviewed and negative. Unless otherwise mentioned in H&P    PHYSICAL EXAM: VS:  There were no vitals taken for this visit. , BMI There is no height or weight on file to calculate BMI. GEN: Well nourished, well developed, in no acute distress HEENT: normal Neck: no JVD, carotid bruits, or masses Cardiac: ***RRR; no murmurs, rubs, or gallops,no edema  Respiratory:  clear to auscultation bilaterally, normal work of breathing GI: soft, nontender, nondistended, + BS MS: no deformity or atrophy Skin: warm and dry, no rash Neuro:  Strength and sensation are intact Psych: euthymic mood, full affect   EKG:  EKG {ACTION; IS/IS [id number] ordered today. The ekg ordered today demonstrates ***   Recent Labs: 03/13/2016: Magnesium 2.1 03/17/2016: B Natriuretic Peptide 221.0 03/24/2016: ALT 18; TSH 0.366 03/26/2016: BUN 9; Creatinine, Ser 0.74; Hemoglobin 9.5; Platelets 644; Potassium 4.1; Sodium 137    Lipid Panel    Component Value Date/Time   CHOL 273 (H) 01/15/2016 0832   TRIG 371 (H) 01/15/2016 0832   HDL 53 01/15/2016 0832   CHOLHDL 5.2 (H) 01/15/2016 0832   VLDL 74 (H) 01/15/2016 0832   LDLCALC 146 (H) 01/15/2016 0832      Wt Readings from Last 3 Encounters:  04/19/16 116 lb 2.9 oz (52.7 kg)  03/28/16 117 lb (53.1 kg)  03/24/16 112 lb (50.8 kg)      Other studies Reviewed: Additional studies/ records that were reviewed today include: ***. Review of the above records demonstrates: ***   ASSESSMENT AND PLAN:  1.  ***   Current medicines  are reviewed at length with the patient today.    Labs/ tests ordered today include: *** No orders of the defined types were placed in this encounter.    Disposition:   FU with *** in {gen number [id number] {TIME; UNITS DAY/WEEK/MONTH:19136}   Signed, Jory Sims, NP  05/10/2016 6:51 AM    [facility] [address], [place], [place] [address] Phone: [phone]; Fax: [phone]

## 2016-05-11 ENCOUNTER — Encounter (HOSPITAL_COMMUNITY): Payer: PRIVATE HEALTH INSURANCE

## 2016-05-13 ENCOUNTER — Encounter: Payer: Self-pay | Admitting: Adult Health

## 2016-05-13 ENCOUNTER — Encounter (HOSPITAL_COMMUNITY): Payer: PRIVATE HEALTH INSURANCE

## 2016-05-13 ENCOUNTER — Ambulatory Visit (INDEPENDENT_AMBULATORY_CARE_PROVIDER_SITE_OTHER): Payer: PRIVATE HEALTH INSURANCE | Admitting: Adult Health

## 2016-05-13 VITALS — BP 150/100 | HR 94 | Ht 60.0 in | Wt 117.0 lb

## 2016-05-13 DIAGNOSIS — R0602 Shortness of breath: Secondary | ICD-10-CM

## 2016-05-13 DIAGNOSIS — N289 Disorder of kidney and ureter, unspecified: Secondary | ICD-10-CM | POA: Diagnosis not present

## 2016-05-13 DIAGNOSIS — D508 Other iron deficiency anemias: Secondary | ICD-10-CM

## 2016-05-13 DIAGNOSIS — I10 Essential (primary) hypertension: Secondary | ICD-10-CM

## 2016-05-13 DIAGNOSIS — I255 Ischemic cardiomyopathy: Secondary | ICD-10-CM

## 2016-05-13 DIAGNOSIS — I2510 Atherosclerotic heart disease of native coronary artery without angina pectoris: Secondary | ICD-10-CM

## 2016-05-13 NOTE — Progress Notes (Signed)
Cardiology Office Note   Date:  05/13/2016   ID:  Erin, [name] Mar 14, 1961, MRN [id number]  PCP:  Jonathon Bellows, MD  Cardiologist: [facility]-Needs to be established in [place]/  Jory Sims, NP   Chief Complaint  Patient presents with  . Coronary Artery Disease  . Cardiomyopathy      History of Present Illness: Erin [name] is a 56 y.o. female who presents for ongoing assessment and management of chronic shortness of breath, anxiety, chronic chest tightness, with coronary artery disease, problems drug-eluting stent to the right coronary artery and PDA with DES 2, ischemic cardiomyopathy with most recent ejection fraction of 30-35%. The patient has been undergoing cardiac rehabilitation. She states each time she takes a deep breath she feels like her chest is being filled with cold air, along with trouble breathing. She states that minimal exertion, which includes brushing her daughter's hair will cause her to feel coldness in her chest and trouble breathing. The patient was added on to my schedule today at her insistence due to her complaints.  She denies recurrent chest pain. She is medically compliant. She states that her blood pressure easily rises with anxiety. Most recent echocardiogram was completed on 03/13/2016:  Left ventricle: The cavity size was normal. Wall thickness was   normal. Systolic function was moderately to severely reduced. The   estimated ejection fraction was in the range of 30% to 35%. There   is akinesis of the inferolateral, inferior, and inferoseptal   myocardium. Features are consistent with a pseudonormal left   ventricular filling pattern, with concomitant abnormal relaxation   and increased filling pressure (grade 2 diastolic dysfunction). - Mitral valve: There was mild regurgitation.  Past Medical History:  Diagnosis Date  . Anxiety state 03/25/2016  . Basal cell carcinoma of forehead   . Coronary artery disease    a. 03/11/16 PCI with  DES-->Prox/Mid Cx, 03/14/16 PCI with DES x2-->RCA, EF 30-35%  . Essential hypertension   . GERD (gastroesophageal reflux disease)   . History of pneumonia   . Hyperlipidemia   . Ischemic cardiomyopathy   . Mitral regurgitation   . NSTEMI (non-ST elevated myocardial infarction) ([place]) 03/10/2016  . Squamous cell cancer of skin of nose   . Thrombocytosis ([place]) 03/26/2016  . Wears dentures   . Wears glasses     Past Surgical History:  Procedure Laterality Date  . APPENDECTOMY    . CARDIAC CATHETERIZATION N/A 03/11/2016   Procedure: Left Heart Cath and Coronary Angiography;  Surgeon: Leonie Man, MD;  Location: [facility];  Service: Cardiovascular;  Laterality: N/A;  . CARDIAC CATHETERIZATION N/A 03/11/2016   Procedure: Coronary Stent Intervention;  Surgeon: Leonie Man, MD;  Location: [facility];  Service: Cardiovascular;  Laterality: N/A;  . CARDIAC CATHETERIZATION N/A 03/14/2016   Procedure: Coronary Stent Intervention;  Surgeon: Peter M Martinique, MD;  Location: [facility];  Service: Cardiovascular;  Laterality: N/A;  . CHOLECYSTECTOMY OPEN  1984  . CORONARY ANGIOPLASTY WITH STENT PLACEMENT  03/14/2016  . FINGER ARTHROPLASTY Left 05/14/2013   Procedure: LEFT THUMB CARPAL METACARPAL ARTHROPLASTY;  Surgeon: Tennis Must, MD;  Location: [facility];  Service: Orthopedics;  Laterality: Left;  . TUBAL LIGATION  1987  . VAGINAL HYSTERECTOMY  2009     Current Outpatient Prescriptions  Medication Sig Dispense Refill  . acetaminophen (TYLENOL) 500 MG tablet Take 1 tablet (500 mg total) by mouth every 8 (eight) hours as needed  for mild pain, moderate pain or headache. 30 tablet 0  . AMBULATORY NON FORMULARY MEDICATION Take 90 mg by mouth 2 (two) times daily. Medication Name: BRILINTA 90 mg BID (TWILIGHT Research study PROVIDED)    . AMBULATORY NON FORMULARY MEDICATION Take 81 mg by mouth daily. Medication Name: Aspirin 81 mg daily (TWILIGHT Research  Study PROVIDED)    . atorvastatin (LIPITOR) 80 MG tablet Take 1 tablet (80 mg total) by mouth daily at 6 PM. 30 tablet 6  . carvedilol (COREG) 3.125 MG tablet Take 1 tablet (3.125 mg total) by mouth 2 (two) times daily with a meal. 60 tablet 3  . LORazepam (ATIVAN) 0.5 MG tablet Take 1 tablet (0.5 mg total) by mouth every 12 (twelve) hours as needed for anxiety. DO NOT DRIVE WHEN TAKING THIS MEDICATION. (Patient taking differently: Take 1 mg by mouth at bedtime as needed for anxiety or sleep. DO NOT DRIVE WHEN TAKING THIS MEDICATION.) 20 tablet 0  . losartan (COZAAR) 25 MG tablet Take 0.5 tablets (12.5 mg total) by mouth every evening. 15 tablet 3  . nitroGLYCERIN (NITROSTAT) 0.4 MG SL tablet Place 1 tablet (0.4 mg total) under the tongue every 5 (five) minutes x 3 doses as needed for chest pain. 25 tablet 3   No current facility-administered medications for this visit.     Allergies:   Patient has no known allergies.    Social History:  The patient  reports that she has quit smoking. Her smoking use included Cigarettes. She has a 15.00 pack-year smoking history. She has never used smokeless tobacco. She reports that she drinks about 3.6 oz of alcohol per week . She reports that she uses drugs, including Marijuana.   Family History:  The patient's family history includes Diabetes in her mother; Heart failure in her other; Hypertension in her mother; Stroke in her mother.    ROS: All other systems are reviewed and negative. Unless otherwise mentioned in H&P    PHYSICAL EXAM: VS:  BP (!) 150/100   Pulse 94   Ht 5' (1.524 m)   Wt 117 lb (53.1 kg)   SpO2 97%   BMI 22.85 kg/m  , BMI Body mass index is 22.85 kg/m. GEN: Well nourished, well developed, in no acute distress  HEENT: normal  Neck: no JVD, carotid bruits, or masses Cardiac: RRR; no murmurs, rubs, or gallops,no edema  Respiratory:  Clear to auscultation bilaterally, normal work of breathing GI: soft, nontender, nondistended, +  BS MS: no deformity or atrophy  Skin: warm and dry, no rash Neuro:  Strength and sensation are intact Psych: euthymic mood, full affect   Recent Labs: 03/13/2016: Magnesium 2.1 03/17/2016: B Natriuretic Peptide 221.0 03/24/2016: ALT 18; TSH 0.366 03/26/2016: BUN 9; Creatinine, Ser 0.74; Hemoglobin 9.5; Platelets 644; Potassium 4.1; Sodium 137    Lipid Panel    Component Value Date/Time   CHOL 273 (H) 01/15/2016 0832   TRIG 371 (H) 01/15/2016 0832   HDL 53 01/15/2016 0832   CHOLHDL 5.2 (H) 01/15/2016 0832   VLDL 74 (H) 01/15/2016 0832   LDLCALC 146 (H) 01/15/2016 0832      Wt Readings from Last 3 Encounters:  05/13/16 117 lb (53.1 kg)  04/19/16 116 lb 2.9 oz (52.7 kg)  03/28/16 117 lb (53.1 kg)      Other studies Reviewed: Additional studies/ records that were reviewed today include:  Conclusion     Mid Cx lesion, 0 %stenosed.  A drug eluting .  A STENT PROMUS  PREM MR 2.25X16 drug eluting stent was successfully placed.  A STENT PROMUS PREM MR 2.25X8 drug-eluting stent was successfully placed.  Dist RCA lesion, 60 %stenosed.  Post intervention, there is a 0% residual stenosis.  Post Atrio lesion, 0 %stenosed.  Post intervention, there is a 0% residual stenosis.  A stent was successfully placed.   1. Successful Stenting of the distal RCA/PDA with DES x 2.       ASSESSMENT AND PLAN:  1. Ischemic Cardiomyopathy: Most recent echocardiogram revealed an EF of 30-35%. I want to repeat her echocardiogram. Continue carvedilol 3.125 mg twice a day, losartan 25 mg daily. There is no evidence of decompensation. May need to be on low-dose diuretic however to assist with diuresis if ejection fraction remains low. Also would consider spironolactone addition to her regimen. Blood pressure is elevated today but she is nervous and easily anxious. Last evaluation less than one month ago blood pressure was normal. Consider Entresto on next office visit.  2. Coronary  artery disease: History of drug-eluting stent to the right coronary artery and PDA 2. We'll repeat nuclear medicine stress test to evaluate for evidence of progressive CAD or restenosis of known stents.   3. Hypertension: Blood pressure is elevated today. She is very anxious today. We'll follow-up closely after echocardiogram and stress test. The patient will likely benefit from addition of Entresto, and possibly spironolactone. Pulse is elevated due to anxiety but may need to consider going up on carvedilol on next office visit. Right now want to test her where she is before making medication adjustments.   Current medicines are reviewed at length with the patient today.    Labs/ tests ordered today include:  Orders Placed This Encounter  Procedures  . CBC with Differential  . Basic Metabolic Panel (BMET)  . ECHOCARDIOGRAM COMPLETE  . Pulmonary function test     Disposition:   FU with 2 weeks. Will need to be established with cardiologist here in [place]. Normally followed by Fransico Him but has not seen her since hospitalization in November 2017.  Signed, Jory Sims, NP  05/13/2016 6:09 PM    [facility] [address] [place], [place] [address] Phone: [phone]; Fax: [phone]

## 2016-05-13 NOTE — Patient Instructions (Signed)
Your physician recommends that you schedule a follow-up appointment in: 1 Month  Your physician recommends that you continue on your current medications as directed. Please refer to the Current Medication list given to you today.  Your physician recommends that you have lab work done Today  Your physician has requested that you have an echocardiogram. Echocardiography is a painless test that uses sound waves to create images of your heart. It provides your doctor with information about the size and shape of your heart and how well your heart's chambers and valves are working. This procedure takes approximately one hour. There are no restrictions for this procedure.  Your physician has recommended that you have a pulmonary function test. Pulmonary Function Tests are a group of tests that measure how well air moves in and out of your lungs.  If you need a refill on your cardiac medications before your next appointment, please call your pharmacy.  Thank you for choosing [facility]!

## 2016-05-13 NOTE — Progress Notes (Signed)
Name: Erin [name]    DOB: 09-01-1960  Age: 56 y.o.  MR#: [id number]       PCP:  Jonathon Bellows, MD      Insurance: Payor: MEDCOST / Plan: MEDCOST / Product Type: *No Product type* /   CC:   No chief complaint on file.   VS Vitals:   05/13/16 1503  BP: (!) 150/100  Pulse: 94  SpO2: 97%  Weight: 117 lb (53.1 kg)  Height: 5' (1.524 m)    Weights Current Weight  05/13/16 117 lb (53.1 kg)  04/19/16 116 lb 2.9 oz (52.7 kg)  03/28/16 117 lb (53.1 kg)    Blood Pressure  BP Readings from Last 3 Encounters:  05/13/16 (!) 150/100  04/19/16 106/74  03/28/16 108/76     Admit date:  (Not on file) Last encounter with RMR:  04/15/2016   Allergy Patient has no known allergies.  Current Outpatient Prescriptions  Medication Sig Dispense Refill  . acetaminophen (TYLENOL) 500 MG tablet Take 1 tablet (500 mg total) by mouth every 8 (eight) hours as needed for mild pain, moderate pain or headache. 30 tablet 0  . AMBULATORY NON FORMULARY MEDICATION Take 90 mg by mouth 2 (two) times daily. Medication Name: BRILINTA 90 mg BID (TWILIGHT Research study PROVIDED)    . AMBULATORY NON FORMULARY MEDICATION Take 81 mg by mouth daily. Medication Name: Aspirin 81 mg daily (TWILIGHT Research Study PROVIDED)    . atorvastatin (LIPITOR) 80 MG tablet Take 1 tablet (80 mg total) by mouth daily at 6 PM. 30 tablet 6  . carvedilol (COREG) 3.125 MG tablet Take 1 tablet (3.125 mg total) by mouth 2 (two) times daily with a meal. 60 tablet 3  . LORazepam (ATIVAN) 0.5 MG tablet Take 1 tablet (0.5 mg total) by mouth every 12 (twelve) hours as needed for anxiety. DO NOT DRIVE WHEN TAKING THIS MEDICATION. (Patient taking differently: Take 1 mg by mouth at bedtime as needed for anxiety or sleep. DO NOT DRIVE WHEN TAKING THIS MEDICATION.) 20 tablet 0  . losartan (COZAAR) 25 MG tablet Take 0.5 tablets (12.5 mg total) by mouth every evening. 15 tablet 3  . nitroGLYCERIN (NITROSTAT) 0.4 MG SL tablet Place 1 tablet (0.4 mg  total) under the tongue every 5 (five) minutes x 3 doses as needed for chest pain. 25 tablet 3   No current facility-administered medications for this visit.     Discontinued Meds:   There are no discontinued medications.  Patient Active Problem List   Diagnosis Date Noted  . Thrombocytosis ([facility]) 03/26/2016  . Cardiomyopathy, ischemic 03/25/2016  . Chronic combined systolic and diastolic congestive heart failure ([place]) 03/25/2016  . Anxiety state 03/25/2016  . Elevated troponin I level 03/25/2016  . CAD (coronary artery disease) 03/25/2016  . Normocytic anemia 03/25/2016  . SOB (shortness of breath) 03/24/2016  . Lightheadedness 03/17/2016  . Hypotension 03/17/2016  . Tobacco abuse 03/12/2016  . NSTEMI (non-ST elevated myocardial infarction) ([place]) 03/11/2016  . Pain in the chest   . Essential hypertension 09/06/2015  . Hyperlipidemia 09/06/2015  . GERD (gastroesophageal reflux disease) 09/06/2015  . Chest pain 09/06/2015    LABS    Component Value Date/Time   NA 137 03/26/2016 0723   NA 138 03/24/2016 1227   NA 134 (L) 03/18/2016 0612   K 4.1 03/26/2016 0723   K 3.6 03/24/2016 1227   K 3.3 (L) 03/18/2016 0612   CL 109 03/26/2016 0723   CL 106 03/24/2016 1227  CL 108 03/18/2016 0612   CO2 24 03/26/2016 0723   CO2 22 03/24/2016 1227   CO2 20 (L) 03/18/2016 0612   GLUCOSE 92 03/26/2016 0723   GLUCOSE 88 03/24/2016 1227   GLUCOSE 92 03/18/2016 0612   BUN 9 03/26/2016 0723   BUN 10 03/24/2016 1227   BUN 9 03/18/2016 0612   CREATININE 0.74 03/26/2016 0723   CREATININE 0.69 03/24/2016 1737   CREATININE 0.71 03/24/2016 1227   CALCIUM 8.6 (L) 03/26/2016 0723   CALCIUM 9.6 03/24/2016 1227   CALCIUM 8.0 (L) 03/18/2016 0612   GFRNONAA >60 03/26/2016 0723   GFRNONAA >60 03/24/2016 1737   GFRNONAA >60 03/24/2016 1227   GFRAA >60 03/26/2016 0723   GFRAA >60 03/24/2016 1737   GFRAA >60 03/24/2016 1227   CMP     Component Value Date/Time   NA 137 03/26/2016 0723   K  4.1 03/26/2016 0723   CL 109 03/26/2016 0723   CO2 24 03/26/2016 0723   GLUCOSE 92 03/26/2016 0723   BUN 9 03/26/2016 0723   CREATININE 0.74 03/26/2016 0723   CALCIUM 8.6 (L) 03/26/2016 0723   PROT 7.0 03/24/2016 1227   ALBUMIN 3.3 (L) 03/24/2016 1227   AST 23 03/24/2016 1227   ALT 18 03/24/2016 1227   ALKPHOS 71 03/24/2016 1227   BILITOT 0.4 03/24/2016 1227   GFRNONAA >60 03/26/2016 0723   GFRAA >60 03/26/2016 0723       Component Value Date/Time   WBC 8.7 03/26/2016 0723   WBC 9.4 03/24/2016 1737   WBC 11.1 (H) 03/24/2016 1227   HGB 9.5 (L) 03/26/2016 0723   HGB 9.3 (L) 03/24/2016 1737   HGB 9.6 (L) 03/24/2016 1227   HCT 29.1 (L) 03/26/2016 0723   HCT 28.3 (L) 03/24/2016 1737   HCT 29.2 (L) 03/24/2016 1227   MCV 85.3 03/26/2016 0723   MCV 84.7 03/24/2016 1737   MCV 85.1 03/24/2016 1227    Lipid Panel     Component Value Date/Time   CHOL 273 (H) 01/15/2016 0832   TRIG 371 (H) 01/15/2016 0832   HDL 53 01/15/2016 0832   CHOLHDL 5.2 (H) 01/15/2016 0832   VLDL 74 (H) 01/15/2016 0832   LDLCALC 146 (H) 01/15/2016 0832    ABG    Component Value Date/Time   TCO2 22 03/11/2016 1649     Lab Results  Component Value Date   TSH 0.366 03/24/2016   BNP (last 3 results)  Recent Labs  03/17/16 1822  BNP 221.0*    ProBNP (last 3 results) No results for input(s): PROBNP in the last 8760 hours.  Cardiac Panel (last 3 results) No results for input(s): CKTOTAL, CKMB, TROPONINI, RELINDX in the last 72 hours.  Iron/TIBC/Ferritin/ %Sat    Component Value Date/Time   IRON 10 (L) 03/18/2016 0940   TIBC 252 03/18/2016 0940   FERRITIN 356 (H) 03/18/2016 0940   IRONPCTSAT 4 (L) 03/18/2016 0940     EKG Orders placed or performed during the hospital encounter of 03/24/16  . EKG 12-Lead  . EKG 12-Lead  . EKG     Prior Assessment and Plan Problem List as of 05/13/2016 Reviewed: 03/28/2016  4:48 PM by Jory Sims, NP     Cardiovascular and Mediastinum    Essential hypertension   NSTEMI (non-ST elevated myocardial infarction) ([place])   Hypotension   Cardiomyopathy, ischemic   Chronic combined systolic and diastolic congestive heart failure ([place])   CAD (coronary artery disease)     Digestive  GERD (gastroesophageal reflux disease)     Hematopoietic and Hemostatic   Thrombocytosis ([facility])     Other   Hyperlipidemia   Chest pain   Pain in the chest   Tobacco abuse   Lightheadedness   SOB (shortness of breath)   Anxiety state   Elevated troponin I level   Normocytic anemia       Imaging: No results found.

## 2016-05-14 LAB — CBC WITH DIFFERENTIAL/PLATELET
Basophils Absolute: 0 cells/uL (ref 0–200)
Basophils Relative: 0 %
Eosinophils Absolute: 75 cells/uL (ref 15–500)
Eosinophils Relative: 1 %
HCT: 34.8 % — ABNORMAL LOW (ref 35.0–45.0)
Hemoglobin: 11.2 g/dL — ABNORMAL LOW (ref 11.7–15.5)
Lymphocytes Relative: 29 %
Lymphs Abs: 2175 cells/uL (ref 850–3900)
MCH: 27.3 pg (ref 27.0–33.0)
MCHC: 32.2 g/dL (ref 32.0–36.0)
MCV: 84.7 fL (ref 80.0–100.0)
MPV: 9.7 fL (ref 7.5–12.5)
Monocytes Absolute: 600 cells/uL (ref 200–950)
Monocytes Relative: 8 %
Neutro Abs: 4650 cells/uL (ref 1500–7800)
Neutrophils Relative %: 62 %
Platelets: 437 10*3/uL — ABNORMAL HIGH (ref 140–400)
RBC: 4.11 MIL/uL (ref 3.80–5.10)
RDW: 15.3 % — ABNORMAL HIGH (ref 11.0–15.0)
WBC: 7.5 10*3/uL (ref 3.8–10.8)

## 2016-05-14 LAB — BASIC METABOLIC PANEL
BUN: 13 mg/dL (ref 7–25)
CO2: 19 mmol/L — ABNORMAL LOW (ref 20–31)
Calcium: 9.6 mg/dL (ref 8.6–10.4)
Chloride: 110 mmol/L (ref 98–110)
Creat: 0.73 mg/dL (ref 0.50–1.05)
Glucose, Bld: 112 mg/dL — ABNORMAL HIGH (ref 65–99)
Potassium: 4 mmol/L (ref 3.5–5.3)
Sodium: 141 mmol/L (ref 135–146)

## 2016-05-16 ENCOUNTER — Encounter (HOSPITAL_COMMUNITY): Payer: PRIVATE HEALTH INSURANCE

## 2016-05-16 ENCOUNTER — Inpatient Hospital Stay (HOSPITAL_COMMUNITY)
Admission: EM | Admit: 2016-05-16 | Discharge: 2016-05-18 | DRG: 291 | Disposition: A | Discharge status: Home / Self Care | Payer: PRIVATE HEALTH INSURANCE | Attending: Internal Medicine | Admitting: Internal Medicine

## 2016-05-16 ENCOUNTER — Inpatient Hospital Stay (HOSPITAL_COMMUNITY): Payer: PRIVATE HEALTH INSURANCE

## 2016-05-16 ENCOUNTER — Emergency Department (HOSPITAL_COMMUNITY): Payer: PRIVATE HEALTH INSURANCE

## 2016-05-16 ENCOUNTER — Encounter (HOSPITAL_COMMUNITY): Payer: Self-pay | Admitting: *Deleted

## 2016-05-16 DIAGNOSIS — Z823 Family history of stroke: Secondary | ICD-10-CM | POA: Diagnosis not present

## 2016-05-16 DIAGNOSIS — I255 Ischemic cardiomyopathy: Secondary | ICD-10-CM | POA: Diagnosis present

## 2016-05-16 DIAGNOSIS — K219 Gastro-esophageal reflux disease without esophagitis: Secondary | ICD-10-CM | POA: Diagnosis present

## 2016-05-16 DIAGNOSIS — E785 Hyperlipidemia, unspecified: Secondary | ICD-10-CM | POA: Diagnosis present

## 2016-05-16 DIAGNOSIS — Z833 Family history of diabetes mellitus: Secondary | ICD-10-CM | POA: Diagnosis not present

## 2016-05-16 DIAGNOSIS — I509 Heart failure, unspecified: Secondary | ICD-10-CM

## 2016-05-16 DIAGNOSIS — R748 Abnormal levels of other serum enzymes: Secondary | ICD-10-CM

## 2016-05-16 DIAGNOSIS — J9601 Acute respiratory failure with hypoxia: Secondary | ICD-10-CM | POA: Diagnosis present

## 2016-05-16 DIAGNOSIS — R0902 Hypoxemia: Secondary | ICD-10-CM | POA: Diagnosis not present

## 2016-05-16 DIAGNOSIS — I110 Hypertensive heart disease with heart failure: Secondary | ICD-10-CM | POA: Diagnosis not present

## 2016-05-16 DIAGNOSIS — I252 Old myocardial infarction: Secondary | ICD-10-CM

## 2016-05-16 DIAGNOSIS — Z955 Presence of coronary angioplasty implant and graft: Secondary | ICD-10-CM | POA: Diagnosis not present

## 2016-05-16 DIAGNOSIS — N179 Acute kidney failure, unspecified: Secondary | ICD-10-CM | POA: Diagnosis present

## 2016-05-16 DIAGNOSIS — I2510 Atherosclerotic heart disease of native coronary artery without angina pectoris: Secondary | ICD-10-CM | POA: Diagnosis present

## 2016-05-16 DIAGNOSIS — R778 Other specified abnormalities of plasma proteins: Secondary | ICD-10-CM | POA: Diagnosis present

## 2016-05-16 DIAGNOSIS — I340 Nonrheumatic mitral (valve) insufficiency: Secondary | ICD-10-CM | POA: Diagnosis present

## 2016-05-16 DIAGNOSIS — I5023 Acute on chronic systolic (congestive) heart failure: Secondary | ICD-10-CM | POA: Diagnosis not present

## 2016-05-16 DIAGNOSIS — I248 Other forms of acute ischemic heart disease: Secondary | ICD-10-CM | POA: Diagnosis not present

## 2016-05-16 DIAGNOSIS — R7989 Other specified abnormal findings of blood chemistry: Secondary | ICD-10-CM

## 2016-05-16 DIAGNOSIS — I5043 Acute on chronic combined systolic (congestive) and diastolic (congestive) heart failure: Secondary | ICD-10-CM | POA: Diagnosis present

## 2016-05-16 DIAGNOSIS — Z8249 Family history of ischemic heart disease and other diseases of the circulatory system: Secondary | ICD-10-CM | POA: Diagnosis not present

## 2016-05-16 DIAGNOSIS — E876 Hypokalemia: Secondary | ICD-10-CM

## 2016-05-16 DIAGNOSIS — Z85828 Personal history of other malignant neoplasm of skin: Secondary | ICD-10-CM

## 2016-05-16 DIAGNOSIS — Z87891 Personal history of nicotine dependence: Secondary | ICD-10-CM | POA: Diagnosis not present

## 2016-05-16 LAB — D-DIMER, QUANTITATIVE (NOT AT ARMC): D-Dimer, Quant: 0.27 ug/mL-FEU (ref 0.00–0.50)

## 2016-05-16 LAB — CBC
HCT: 33.3 % — ABNORMAL LOW (ref 36.0–46.0)
HCT: 35.4 % — ABNORMAL LOW (ref 36.0–46.0)
Hemoglobin: 11.1 g/dL — ABNORMAL LOW (ref 12.0–15.0)
Hemoglobin: 11.5 g/dL — ABNORMAL LOW (ref 12.0–15.0)
MCH: 28.4 pg (ref 26.0–34.0)
MCH: 27.6 pg (ref 26.0–34.0)
MCHC: 33.3 g/dL (ref 30.0–36.0)
MCHC: 32.5 g/dL (ref 30.0–36.0)
MCV: 85.2 fL (ref 78.0–100.0)
MCV: 85.1 fL (ref 78.0–100.0)
Platelets: 378 10*3/uL (ref 150–400)
Platelets: 392 10*3/uL (ref 150–400)
RBC: 3.91 MIL/uL (ref 3.87–5.11)
RBC: 4.16 MIL/uL (ref 3.87–5.11)
RDW: 15.3 % (ref 11.5–15.5)
RDW: 15.2 % (ref 11.5–15.5)
WBC: 7.9 10*3/uL (ref 4.0–10.5)
WBC: 9.5 10*3/uL (ref 4.0–10.5)

## 2016-05-16 LAB — CREATININE, SERUM
Creatinine, Ser: 0.74 mg/dL (ref 0.44–1.00)
GFR calc Af Amer: >60 mL/min (ref >60)
GFR calc non Af Amer: >60 mL/min (ref >60)

## 2016-05-16 LAB — TROPONIN I
Troponin I: 0.03 ng/mL (ref <0.03)
Troponin I: 0.03 ng/mL (ref <0.03)
Troponin I: 0.05 ng/mL (ref <0.03)

## 2016-05-16 LAB — PROTIME-INR
INR: 1
Prothrombin Time: 13.2 seconds (ref 11.4–15.2)

## 2016-05-16 LAB — TSH: TSH: 0.937 u[IU]/mL (ref 0.350–4.500)

## 2016-05-16 LAB — BASIC METABOLIC PANEL
Anion gap: 6 (ref 5–15)
BUN: 12 mg/dL (ref 6–20)
CO2: 23 mmol/L (ref 22–32)
Calcium: 9.2 mg/dL (ref 8.9–10.3)
Chloride: 112 mmol/L — ABNORMAL HIGH (ref 101–111)
Creatinine, Ser: 0.74 mg/dL (ref 0.44–1.00)
GFR calc Af Amer: >60 mL/min (ref >60)
GFR calc non Af Amer: >60 mL/min (ref >60)
Glucose, Bld: 101 mg/dL — ABNORMAL HIGH (ref 65–99)
Potassium: 3.6 mmol/L (ref 3.5–5.1)
Sodium: 141 mmol/L (ref 135–145)

## 2016-05-16 LAB — BRAIN NATRIURETIC PEPTIDE: B Natriuretic Peptide: 783 pg/mL — ABNORMAL HIGH (ref 0.0–100.0)

## 2016-05-16 LAB — ECHOCARDIOGRAM COMPLETE
Height: 60 in
Weight: 1872 oz

## 2016-05-16 LAB — APTT: aPTT: 34 seconds (ref 24–36)

## 2016-05-16 MED ORDER — ASPIRIN EC 81 MG PO TBEC
81.0000 mg | DELAYED_RELEASE_TABLET | Freq: Every day | ORAL | Status: DC
  Administered 2016-05-17 – 2016-05-18 (×2): 81 mg via ORAL
  Filled 2016-05-16 (×2): qty 1

## 2016-05-16 MED ORDER — MORPHINE SULFATE (PF) 2 MG/ML IV SOLN
2.0000 mg | Freq: Once | INTRAVENOUS | Status: AC
  Administered 2016-05-16: 2 mg via INTRAVENOUS
  Filled 2016-05-16: qty 1

## 2016-05-16 MED ORDER — ONDANSETRON HCL 4 MG/2ML IJ SOLN: 4.0000 mg | Freq: Four times a day (QID) | INTRAMUSCULAR | Status: DC | PRN

## 2016-05-16 MED ORDER — SODIUM CHLORIDE 0.9% FLUSH: 3.0000 mL | INTRAVENOUS | Status: DC | PRN

## 2016-05-16 MED ORDER — CARVEDILOL 3.125 MG PO TABS
3.1250 mg | ORAL_TABLET | Freq: Two times a day (BID) | ORAL | Status: DC
  Administered 2016-05-18: 3.125 mg via ORAL
  Filled 2016-05-16 (×5): qty 1

## 2016-05-16 MED ORDER — ALBUTEROL (5 MG/ML) CONTINUOUS INHALATION SOLN
INHALATION_SOLUTION | RESPIRATORY_TRACT | Status: AC
  Filled 2016-05-16: qty 20

## 2016-05-16 MED ORDER — ONDANSETRON HCL 4 MG/2ML IJ SOLN
4.0000 mg | Freq: Once | INTRAMUSCULAR | Status: AC
  Administered 2016-05-16: 4 mg via INTRAVENOUS
  Filled 2016-05-16: qty 2

## 2016-05-16 MED ORDER — LORAZEPAM 0.5 MG PO TABS
0.5000 mg | ORAL_TABLET | Freq: Two times a day (BID) | ORAL | Status: DC | PRN
  Administered 2016-05-18: 0.5 mg via ORAL
  Filled 2016-05-16: qty 1

## 2016-05-16 MED ORDER — IPRATROPIUM BROMIDE 0.02 % IN SOLN
0.5000 mg | Freq: Once | RESPIRATORY_TRACT | Status: AC
  Administered 2016-05-16: 0.5 mg via RESPIRATORY_TRACT
  Filled 2016-05-16: qty 2.5

## 2016-05-16 MED ORDER — ALBUTEROL SULFATE (2.5 MG/3ML) 0.083% IN NEBU: 5.0000 mg | INHALATION_SOLUTION | Freq: Once | RESPIRATORY_TRACT | Status: DC

## 2016-05-16 MED ORDER — ENOXAPARIN SODIUM 40 MG/0.4ML ~~LOC~~ SOLN
40.0000 mg | SUBCUTANEOUS | Status: DC
  Administered 2016-05-16 – 2016-05-17 (×2): 40 mg via SUBCUTANEOUS
  Filled 2016-05-16 (×2): qty 0.4

## 2016-05-16 MED ORDER — ACETAMINOPHEN 325 MG PO TABS: 650.0000 mg | ORAL_TABLET | ORAL | Status: DC | PRN

## 2016-05-16 MED ORDER — LOSARTAN POTASSIUM 25 MG PO TABS
12.5000 mg | ORAL_TABLET | Freq: Every evening | ORAL | Status: DC
  Administered 2016-05-16: 12.5 mg via ORAL
  Filled 2016-05-16 (×2): qty 0.5

## 2016-05-16 MED ORDER — FUROSEMIDE 10 MG/ML IJ SOLN
60.0000 mg | Freq: Two times a day (BID) | INTRAMUSCULAR | Status: DC
  Administered 2016-05-16 – 2016-05-18 (×4): 60 mg via INTRAVENOUS
  Filled 2016-05-16 (×4): qty 6

## 2016-05-16 MED ORDER — MORPHINE SULFATE (PF) 2 MG/ML IV SOLN
INTRAVENOUS | Status: AC
  Filled 2016-05-16: qty 1

## 2016-05-16 MED ORDER — ATORVASTATIN CALCIUM 40 MG PO TABS
80.0000 mg | ORAL_TABLET | Freq: Every day | ORAL | Status: DC
  Administered 2016-05-16 – 2016-05-17 (×2): 80 mg via ORAL
  Filled 2016-05-16: qty 2
  Filled 2016-05-16 (×2): qty 1

## 2016-05-16 MED ORDER — LORAZEPAM 2 MG/ML IJ SOLN
INTRAMUSCULAR | Status: AC
  Administered 2016-05-16: 04:00:00
  Filled 2016-05-16: qty 1

## 2016-05-16 MED ORDER — FUROSEMIDE 10 MG/ML IJ SOLN
60.0000 mg | Freq: Once | INTRAMUSCULAR | Status: AC
  Administered 2016-05-16: 60 mg via INTRAVENOUS
  Filled 2016-05-16: qty 6

## 2016-05-16 MED ORDER — IPRATROPIUM BROMIDE 0.02 % IN SOLN: 0.5000 mg | RESPIRATORY_TRACT | Status: DC | PRN

## 2016-05-16 MED ORDER — NITROGLYCERIN 0.4 MG SL SUBL: 0.4000 mg | SUBLINGUAL_TABLET | SUBLINGUAL | Status: DC | PRN

## 2016-05-16 MED ORDER — ONDANSETRON HCL 4 MG/2ML IJ SOLN
4.0000 mg | Freq: Four times a day (QID) | INTRAMUSCULAR | Status: DC | PRN
  Administered 2016-05-18: 4 mg via INTRAVENOUS
  Filled 2016-05-16: qty 2

## 2016-05-16 MED ORDER — MORPHINE SULFATE (PF) 2 MG/ML IV SOLN
2.0000 mg | Freq: Once | INTRAVENOUS | Status: AC
  Administered 2016-05-16: 2 mg via INTRAVENOUS

## 2016-05-16 MED ORDER — ASPIRIN 325 MG PO TABS
325.0000 mg | ORAL_TABLET | Freq: Once | ORAL | Status: AC
  Administered 2016-05-16: 325 mg via ORAL
  Filled 2016-05-16: qty 1

## 2016-05-16 MED ORDER — ALBUTEROL (5 MG/ML) CONTINUOUS INHALATION SOLN
10.0000 mg/h | INHALATION_SOLUTION | RESPIRATORY_TRACT | Status: DC
  Administered 2016-05-16: 10 mg/h via RESPIRATORY_TRACT

## 2016-05-16 MED ORDER — CLOPIDOGREL BISULFATE 75 MG PO TABS
75.0000 mg | ORAL_TABLET | Freq: Every day | ORAL | Status: DC
  Administered 2016-05-17 – 2016-05-18 (×2): 75 mg via ORAL
  Filled 2016-05-16 (×2): qty 1

## 2016-05-16 MED ORDER — NITROGLYCERIN 2 % TD OINT
1.0000 [in_us] | TOPICAL_OINTMENT | Freq: Once | TRANSDERMAL | Status: AC
  Administered 2016-05-16: 1 [in_us] via TOPICAL
  Filled 2016-05-16: qty 1

## 2016-05-16 MED ORDER — ACETAMINOPHEN 325 MG PO TABS
650.0000 mg | ORAL_TABLET | Freq: Once | ORAL | Status: AC
  Administered 2016-05-16: 650 mg via ORAL
  Filled 2016-05-16: qty 2

## 2016-05-16 MED ORDER — ACETAMINOPHEN 325 MG PO TABS
650.0000 mg | ORAL_TABLET | Freq: Four times a day (QID) | ORAL | Status: DC | PRN
  Administered 2016-05-16: 650 mg via ORAL
  Filled 2016-05-16: qty 2

## 2016-05-16 MED ORDER — SODIUM CHLORIDE 0.9% FLUSH
3.0000 mL | Freq: Two times a day (BID) | INTRAVENOUS | Status: DC
  Administered 2016-05-16 – 2016-05-17 (×3): 3 mL via INTRAVENOUS

## 2016-05-16 MED ORDER — SODIUM CHLORIDE 0.9 % IV SOLN: 250.0000 mL | INTRAVENOUS | Status: DC | PRN

## 2016-05-16 NOTE — ED Provider Notes (Signed)
[facility] DEPT Provider Note   CSN: [id number] Arrival date & time: 05/16/16  0253  Time seen 03:50 AM   History   Chief Complaint Chief Complaint  Patient presents with  . Chest Pain    HPI [name] is a 56 y.o. female.  HPI  patient states she started getting a chest squeezing sensation about midnight tonight. She also states it made her feel short of breath with nausea but no vomiting. She states she's been diaphoretic. She states she had something similar when she had a MI in November and had 2 stents placed. She took 2 nitroglycerin at home which she states helped. She has not taken any aspirin tonight.  PCP  WEBB, Valla Leaver, MD Cardiology Dr Ashok Norris and Arnold Long PA  Past Medical History:  Diagnosis Date  . Anxiety state 03/25/2016  . Basal cell carcinoma of forehead   . Coronary artery disease    a. 03/11/16 PCI with DES-->Prox/Mid Cx, 03/14/16 PCI with DES x2-->RCA, EF 30-35%  . Essential hypertension   . GERD (gastroesophageal reflux disease)   . History of pneumonia   . Hyperlipidemia   . Ischemic cardiomyopathy   . Mitral regurgitation   . NSTEMI (non-ST elevated myocardial infarction) ([place]) 03/10/2016  . Squamous cell cancer of skin of nose   . Thrombocytosis ([place]) 03/26/2016  . Wears dentures   . Wears glasses     Patient Active Problem List   Diagnosis Date Noted  . Acute CHF (congestive heart failure) ([place]) 05/16/2016  . Thrombocytosis ([place]) 03/26/2016  . Cardiomyopathy, ischemic 03/25/2016  . Chronic combined systolic and diastolic congestive heart failure ([place]) 03/25/2016  . Anxiety state 03/25/2016  . Elevated troponin I level 03/25/2016  . CAD (coronary artery disease) 03/25/2016  . Normocytic anemia 03/25/2016  . SOB (shortness of breath) 03/24/2016  . Lightheadedness 03/17/2016  . Hypotension 03/17/2016  . Tobacco abuse 03/12/2016  . NSTEMI (non-ST elevated myocardial infarction) ([place]) 03/11/2016  . Pain in the chest   .  Essential hypertension 09/06/2015  . Hyperlipidemia 09/06/2015  . GERD (gastroesophageal reflux disease) 09/06/2015  . Chest pain 09/06/2015    Past Surgical History:  Procedure Laterality Date  . APPENDECTOMY    . CARDIAC CATHETERIZATION N/A 03/11/2016   Procedure: Left Heart Cath and Coronary Angiography;  Surgeon: Leonie Man, MD;  Location: [facility];  Service: Cardiovascular;  Laterality: N/A;  . CARDIAC CATHETERIZATION N/A 03/11/2016   Procedure: Coronary Stent Intervention;  Surgeon: Leonie Man, MD;  Location: [facility];  Service: Cardiovascular;  Laterality: N/A;  . CARDIAC CATHETERIZATION N/A 03/14/2016   Procedure: Coronary Stent Intervention;  Surgeon: Peter M Martinique, MD;  Location: [facility];  Service: Cardiovascular;  Laterality: N/A;  . CHOLECYSTECTOMY OPEN  1984  . CORONARY ANGIOPLASTY WITH STENT PLACEMENT  03/14/2016  . FINGER ARTHROPLASTY Left 05/14/2013   Procedure: LEFT THUMB CARPAL METACARPAL ARTHROPLASTY;  Surgeon: Tennis Must, MD;  Location: [facility];  Service: Orthopedics;  Laterality: Left;  . TUBAL LIGATION  1987  . VAGINAL HYSTERECTOMY  2009    OB History    Gravida Para Term Preterm AB Living   4 4 4     4    SAB TAB Ectopic Multiple Live Births                   Home Medications    Prior to Admission medications   Medication Sig Start Date End Date  Taking? Authorizing Provider  acetaminophen (TYLENOL) 500 MG tablet Take 1 tablet (500 mg total) by mouth every 8 (eight) hours as needed for mild pain, moderate pain or headache. 03/15/16   Cheryln Manly, NP  AMBULATORY NON FORMULARY MEDICATION Take 90 mg by mouth 2 (two) times daily. Medication Name: BRILINTA 90 mg BID (TWILIGHT Research study PROVIDED) 03/15/16   Burnell Blanks, MD  AMBULATORY NON FORMULARY MEDICATION Take 81 mg by mouth daily. Medication Name: Aspirin 81 mg daily (TWILIGHT Research Study PROVIDED) 03/15/16   Burnell Blanks, MD  atorvastatin (LIPITOR) 80 MG tablet Take 1 tablet (80 mg total) by mouth daily at 6 PM. 03/15/16   Cheryln Manly, NP  carvedilol (COREG) 3.125 MG tablet Take 1 tablet (3.125 mg total) by mouth 2 (two) times daily with a meal. 03/26/16   Rexene Alberts, MD  LORazepam (ATIVAN) 0.5 MG tablet Take 1 tablet (0.5 mg total) by mouth every 12 (twelve) hours as needed for anxiety. DO NOT DRIVE WHEN TAKING THIS MEDICATION. Patient taking differently: Take 1 mg by mouth at bedtime as needed for anxiety or sleep. DO NOT DRIVE WHEN TAKING THIS MEDICATION. 03/26/16   Rexene Alberts, MD  losartan (COZAAR) 25 MG tablet Take 0.5 tablets (12.5 mg total) by mouth every evening. 03/26/16   Rexene Alberts, MD  nitroGLYCERIN (NITROSTAT) 0.4 MG SL tablet Place 1 tablet (0.4 mg total) under the tongue every 5 (five) minutes x 3 doses as needed for chest pain. 09/06/15   Almyra Deforest, PA    Family History Family History  Problem Relation Age of Onset  . Stroke Mother   . Hypertension Mother   . Diabetes Mother   . Heart failure Other     Social History Social History  Substance Use Topics  . Smoking status: Former Smoker    Packs/day: 1.00    Years: 15.00    Types: Cigarettes  . Smokeless tobacco: Never Used     Comment: quit 2 weeks ago-03/24/16  . Alcohol use 3.6 oz/week    6 Cans of beer per week     Comment: occ  employed States she had 1 beer and 1 glass of wine tonight   Allergies   Patient has no known allergies.   Review of Systems Review of Systems  All other systems reviewed and are negative.    Physical Exam Updated Vital Signs BP 120/96   Pulse 80   Temp 97.7 F (36.5 C) (Oral)   Resp 18   Ht 5' (1.524 m)   Wt 117 lb (53.1 kg)   SpO2 100%   BMI 22.85 kg/m   Vital signs normal    Physical Exam  Constitutional: She is oriented to person, place, and time. She appears well-developed and well-nourished.  Non-toxic appearance. She does not appear ill. She appears  distressed.  Appears anxious, her significant other is asking for her to get medication for panic attack.  HENT:  Head: Normocephalic and atraumatic.  Right Ear: External ear normal.  Left Ear: External ear normal.  Nose: Nose normal. No mucosal edema or rhinorrhea.  Mouth/Throat: Oropharynx is clear and moist and mucous membranes are normal. No dental abscesses or uvula swelling.  Eyes: Conjunctivae and EOM are normal. Pupils are equal, round, and reactive to light.  Neck: Normal range of motion and full passive range of motion without pain. Neck supple.  Cardiovascular: Normal rate, regular rhythm and normal heart sounds.  Exam reveals no gallop and no friction  rub.   No murmur heard. Pulmonary/Chest: Effort normal and breath sounds normal. No respiratory distress. She has no wheezes. She has no rhonchi. She has no rales. She exhibits no tenderness and no crepitus.  Abdominal: Soft. Normal appearance and bowel sounds are normal. She exhibits no distension. There is no tenderness. There is no rebound and no guarding.  Musculoskeletal: Normal range of motion. She exhibits no edema or tenderness.  Moves all extremities well.   Neurological: She is alert and oriented to person, place, and time. She has normal strength. No cranial nerve deficit.  Skin: Skin is warm, dry and intact. No rash noted. No erythema. No pallor.  Psychiatric: She has a normal mood and affect. Her speech is normal and behavior is normal. Her mood appears not anxious.  Nursing note and vitals reviewed.    ED Treatments / Results  Labs (all labs ordered are listed, but only abnormal results are displayed) Results for orders placed or performed during the hospital encounter of 0000000  Basic metabolic panel  Result Value Ref Range   Sodium 141 135 - 145 mmol/L   Potassium 3.6 3.5 - 5.1 mmol/L   Chloride 112 (H) 101 - 111 mmol/L   CO2 23 22 - 32 mmol/L   Glucose, Bld 101 (H) 65 - 99 mg/dL   BUN 12 6 - 20 mg/dL    Creatinine, Ser 0.74 0.44 - 1.00 mg/dL   Calcium 9.2 8.9 - 10.3 mg/dL   GFR calc non Af Amer >60 >60 mL/min   GFR calc Af Amer >60 >60 mL/min   Anion gap 6 5 - 15  CBC  Result Value Ref Range   WBC 7.9 4.0 - 10.5 K/uL   RBC 3.91 3.87 - 5.11 MIL/uL   Hemoglobin 11.1 (L) 12.0 - 15.0 g/dL   HCT 33.3 (L) 36.0 - 46.0 %   MCV 85.2 78.0 - 100.0 fL   MCH 28.4 26.0 - 34.0 pg   MCHC 33.3 30.0 - 36.0 g/dL   RDW 15.3 11.5 - 15.5 %   Platelets 378 150 - 400 K/uL  Troponin I  Result Value Ref Range   Troponin I 0.03 (HH) <0.03 ng/mL  Brain natriuretic peptide  Result Value Ref Range   B Natriuretic Peptide 783.0 (H) 0.0 - 100.0 pg/mL  D-dimer, quantitative  Result Value Ref Range   D-Dimer, Quant 0.27 0.00 - 0.50 ug/mL-FEU  Protime-INR  Result Value Ref Range   Prothrombin Time 13.2 11.4 - 15.2 seconds   INR 1.00   APTT  Result Value Ref Range   aPTT 34 24 - 36 seconds  Troponin I  Result Value Ref Range   Troponin I 0.03 (HH) <0.03 ng/mL   Laboratory interpretation all normal except Positive troponin mild    EKG  EKG Interpretation  Date/Time:  Monday May 16 2016 03:05:49 EST Ventricular Rate:  81 PR Interval:    QRS Duration: 89 QT Interval:  424 QTC Calculation: 493 R Axis:   39 Text Interpretation:  Sinus rhythm Borderline T abnormalities, inferior leads Borderline prolonged QT interval Baseline wander in lead(s) V2 No significant change since last tracing 24 Mar 2016 Confirmed by Jakeline Dave  MD-I, Iwalani Templeton ([id number]) on 05/16/2016 3:48:23 AM       EKG Interpretation  Date/Time:  Monday May 16 2016 05:47:56 EST Ventricular Rate:  92 PR Interval:    QRS Duration: 97 QT Interval:  402 QTC Calculation: 498 R Axis:   31 Text Interpretation:  Sinus rhythm LAE,  consider biatrial enlargement Borderline low voltage, extremity leads Borderline prolonged QT interval T-wave inversion in V6 No significant change since last tracing HOURS EARLIER Confirmed by Saunders Arlington  MD-I, Lakrista Scaduto ([id number])  on 05/16/2016 5:58:00 AM        Radiology Dg Chest 2 View  Result Date: 05/16/2016 CLINICAL DATA:  Acute onset of mid chest tightness, shortness of breath and dizziness. Initial encounter. EXAM: CHEST  2 VIEW COMPARISON:  Chest radiograph performed 03/24/2016 FINDINGS: The lungs are well-aerated. Vascular congestion is noted. Increased interstitial markings raise concern for mild pulmonary edema. There is no evidence of pleural effusion or pneumothorax. The heart is normal in size; the mediastinal contour is within normal limits. No acute osseous abnormalities are seen. Clips are noted within the right upper quadrant, reflecting prior cholecystectomy. IMPRESSION: Vascular congestion noted. Increased interstitial markings raise concern for mild pulmonary edema. Electronically Signed   By: Garald Balding M.D.   On: 05/16/2016 04:00    Procedures Procedures (including critical care time)  Medications Ordered in ED Medications  albuterol (PROVENTIL, VENTOLIN) (5 MG/ML) 0.5% continuous inhalation solution (  Canceled Entry 05/16/16 0423)  albuterol (PROVENTIL,VENTOLIN) solution continuous neb (10 mg/hr Nebulization New Bag/Given 05/16/16 0423)  ondansetron (ZOFRAN) injection 4 mg (4 mg Intravenous Given 05/16/16 0427)  aspirin tablet 325 mg (325 mg Oral Given 05/16/16 0605)  nitroGLYCERIN (NITROGLYN) 2 % ointment 1 inch (1 inch Topical Given 05/16/16 0428)  acetaminophen (TYLENOL) tablet 650 mg (650 mg Oral Given 05/16/16 0605)  ipratropium (ATROVENT) nebulizer solution 0.5 mg (0.5 mg Nebulization Given 05/16/16 0423)  furosemide (LASIX) injection 60 mg (60 mg Intravenous Given 05/16/16 0427)  LORazepam (ATIVAN) 2 MG/ML injection (  Given 05/16/16 0422)  morphine 2 MG/ML injection 2 mg (2 mg Intravenous Given 05/16/16 0431)  morphine 2 MG/ML injection 2 mg (2 mg Intravenous Given 05/16/16 0545)     Initial Impression / Assessment and Plan / ED Course  I have reviewed the triage vital signs and the nursing  notes.  Pertinent labs & imaging results that were available during my care of the patient were reviewed by me and considered in my medical decision making (see chart for details).  Clinical Course    Shortly after I left the room husband called for help. Patient now is in significant respiratory distress with audible rales and sweating. Nurses report a pulse ox dropped into the 70s. With nasal cannula her pulse ox borderline. She was placed on a nonrebreather mask. She was given albuterol and Atrovent nebulizer and Lasix IV. INR to talk to patient and her husband about transferring to [facility] due to the positive troponin.  PT was given ativan 0.5 mg IV and morphine 2 mg IV.   Recheck 04:30 AM pt is getting her nebulizer, she has gotten IV lasix. Her pulse ox is till in high 80's on NRM. Getting ready to put on bipap.   Recheck at 05:30 AM Patient has been on BiPAP. She is now calm, her vital signs stabilized. She states she has mild chest tightness still. Morphine was added. She was unable to get the aspirin and Tylenol that was ordered earlier before she decompensated. Second troponin and EKG ordered. I'm going to talk to cardiology about transferring to [facility].  06:15 AM patient and husband given her final test results. At this point she appears to be an exacerabtion of her CHF and not a NSTEMI.   06:30 AM discussed with Dr Percival Spanish, Cardiology, feels she can be admitted  here and consult cardiology here  06:43 AM Dr Marin Comment, admit to step down.   Mar 13, 2016 Cardiac Cath Conclusion     Mid Cx lesion, 0 %stenosed.  A drug eluting .  A STENT PROMUS PREM MR 2.25X16 drug eluting stent was successfully placed.  A STENT PROMUS PREM MR 2.25X8 drug-eluting stent was successfully placed.  Dist RCA lesion, 60 %stenosed.  Post intervention, there is a 0% residual stenosis.  Post Atrio lesion, 0 %stenosed.  Post intervention, there is a 0% residual stenosis.  A stent was  successfully placed.   1. Successful Stenting of the distal RCA/PDA with DES x 2.   Cardiac Cath Mar 11, 2016 Conclusion     Prox-Mid Cx lesion, 100 %stenosed.  A STENT SYNERGY DES 3X12 drug eluting stent was successfully placed, and overlaps previously placed stent.  Post intervention, there is a 0% residual stenosis.  Dist RCA lesion, 80 %stenosed. - Would consider treatment in a staged fashion, could either be done during this hospitalization or as an outpatient.  The left ventricular ejection fraction is 45-50% by visual estimate.  There is severe (4+) mitral regurgitation.  LV end diastolic pressure is severely elevated.     Echocardiogram Mar 13, 2016 Study Conclusions  - Left ventricle: The cavity size was normal. Wall thickness was   normal. Systolic function was moderately to severely reduced. The   estimated ejection fraction was in the range of 30% to 35%. There   is akinesis of the inferolateral, inferior, and inferoseptal   myocardium. Features are consistent with a pseudonormal left   ventricular filling pattern, with concomitant abnormal relaxation   and increased filling pressure (grade 2 diastolic dysfunction). - Mitral valve: There was mild regurgitation.  Impressions:  - When compared to prior, inferior lateral MI is new (EF was 55%,   now reduced)  Final Clinical Impressions(s) / ED Diagnoses   Final diagnoses:  Acute on chronic combined systolic and diastolic congestive heart failure (HCC)  Hypoxia  Troponin level elevated    Plan admission  CRITICAL CARE Performed by: Cornisha Zetino L Arun Herrod Total critical care time: 49 minutes Critical care time was exclusive of separately billable procedures and treating other patients. Critical care was necessary to treat or prevent imminent or life-threatening deterioration. Critical care was time spent personally by me on the following activities: development of treatment plan with patient and/or surrogate as  well as nursing, discussions with consultants, evaluation of patient's response to treatment, examination of patient, obtaining history from patient or surrogate, ordering and performing treatments and interventions, ordering and review of laboratory studies, ordering and review of radiographic studies, pulse oximetry and re-evaluation of patient's condition.     Rolland Porter, MD 05/16/16 [id number]

## 2016-05-16 NOTE — ED Notes (Signed)
Pt on bi-pap.  No respiratory distress .  Denies any pain.  States she feels better.

## 2016-05-16 NOTE — H&P (Signed)
History and Physical    Erin [name] [id number] DOB: 07/27/1960 DOA: 05/16/2016  PCP: Jonathon Bellows, MD  Patient coming from:Home  Chief Complaint:chest pain, SOB  HPI: Erin [name] is a 56 y.o. female with medical history significant of hypertension, acid reflux, ischemic cardiomyopathy, coronary artery disease status post drug-eluting stent on on November 3, left ventricular ejection fraction of 30-35%, not on diuretics at home presented with worsening shortness of breath, dyspnea on exertion and left-sided chest pain. Patient reported that she had a baseline shortness of breath since the recent MI but last night she started having a squeezing type left-sided chest pain, continuous, not radiating, associated with shortness of breath, cough and worsening dyspnea on exertion. Also with nausea. Patient was not taking diuretics at home and she was not watching her diet. The fiance reported that they were eating bacon and other high salt containing food at home. No fever, chills, headache, dizziness, recent travel, sick contacts, runny nose, sore throat, abdominal pain, diarrhea, constipation, dysuria or urgency.  ED Course: In the ER patient was found to have respiratory distress, hypoxia, elevated troponin. Patient was placed on BiPAP, treated with nebulizer, IV Lasix. Discussed with the cardiologist at [facility] who recommended patient to be admitted here and have cartilage consult. During my examination patient reported already feeling much better. Patient's fianc at bedside.  Review of Systems: As per HPI otherwise 10 point review of systems negative.    Past Medical History:  Diagnosis Date  . Anxiety state 03/25/2016  . Basal cell carcinoma of forehead   . Coronary artery disease    a. 03/11/16 PCI with DES-->Prox/Mid Cx, 03/14/16 PCI with DES x2-->RCA, EF 30-35%  . Essential hypertension   . GERD (gastroesophageal reflux disease)   . History of pneumonia   . Hyperlipidemia     . Ischemic cardiomyopathy   . Mitral regurgitation   . NSTEMI (non-ST elevated myocardial infarction) ([place]) 03/10/2016  . Squamous cell cancer of skin of nose   . Thrombocytosis ([place]) 03/26/2016  . Wears dentures   . Wears glasses     Past Surgical History:  Procedure Laterality Date  . APPENDECTOMY    . CARDIAC CATHETERIZATION N/A 03/11/2016   Procedure: Left Heart Cath and Coronary Angiography;  Surgeon: Leonie Man, MD;  Location: [place] [facility];  Service: Cardiovascular;  Laterality: N/A;  . CARDIAC CATHETERIZATION N/A 03/11/2016   Procedure: Coronary Stent Intervention;  Surgeon: Leonie Man, MD;  Location: [place] [facility];  Service: Cardiovascular;  Laterality: N/A;  . CARDIAC CATHETERIZATION N/A 03/14/2016   Procedure: Coronary Stent Intervention;  Surgeon: Peter M Martinique, MD;  Location: [place] [facility];  Service: Cardiovascular;  Laterality: N/A;  . CHOLECYSTECTOMY OPEN  1984  . CORONARY ANGIOPLASTY WITH STENT PLACEMENT  03/14/2016  . FINGER ARTHROPLASTY Left 05/14/2013   Procedure: LEFT THUMB CARPAL METACARPAL ARTHROPLASTY;  Surgeon: Tennis Must, MD;  Location: [place];  Service: Orthopedics;  Laterality: Left;  . TUBAL LIGATION  1987  . VAGINAL HYSTERECTOMY  2009    Social History: reports that she has quit smoking. Her smoking use included Cigarettes. She has a 15.00 pack-year smoking history. She has never used smokeless tobacco. She reports that she drinks about 3.6 oz of alcohol per week . She reports that she uses drugs, including Marijuana.  No Known Allergies NKDA, reviewed with the patient.  Family History  Problem Relation Age of Onset  . Stroke Mother   .  Hypertension Mother   . Diabetes Mother   . Heart failure Other      Prior to Admission medications   Medication Sig Start Date End Date Taking? Authorizing Provider  acetaminophen (TYLENOL) 500 MG tablet Take 1 tablet (500 mg total) by mouth every 8 (eight) hours as  needed for mild pain, moderate pain or headache. 03/15/16   Cheryln Manly, NP  AMBULATORY NON FORMULARY MEDICATION Take 90 mg by mouth 2 (two) times daily. Medication Name: BRILINTA 90 mg BID (TWILIGHT Research study PROVIDED) 03/15/16   Burnell Blanks, MD  AMBULATORY NON FORMULARY MEDICATION Take 81 mg by mouth daily. Medication Name: Aspirin 81 mg daily (TWILIGHT Research Study PROVIDED) 03/15/16   Burnell Blanks, MD  atorvastatin (LIPITOR) 80 MG tablet Take 1 tablet (80 mg total) by mouth daily at 6 PM. 03/15/16   Cheryln Manly, NP  carvedilol (COREG) 3.125 MG tablet Take 1 tablet (3.125 mg total) by mouth 2 (two) times daily with a meal. 03/26/16   Rexene Alberts, MD  LORazepam (ATIVAN) 0.5 MG tablet Take 1 tablet (0.5 mg total) by mouth every 12 (twelve) hours as needed for anxiety. DO NOT DRIVE WHEN TAKING THIS MEDICATION. Patient taking differently: Take 1 mg by mouth at bedtime as needed for anxiety or sleep. DO NOT DRIVE WHEN TAKING THIS MEDICATION. 03/26/16   Rexene Alberts, MD  losartan (COZAAR) 25 MG tablet Take 0.5 tablets (12.5 mg total) by mouth every evening. 03/26/16   Rexene Alberts, MD  nitroGLYCERIN (NITROSTAT) 0.4 MG SL tablet Place 1 tablet (0.4 mg total) under the tongue every 5 (five) minutes x 3 doses as needed for chest pain. 09/06/15   Almyra Deforest, PA    Physical Exam: Vitals:   05/16/16 0630 05/16/16 0700 05/16/16 0730 05/16/16 0745  BP: 106/77  104/81   Pulse: 78 75  84  Resp: 17 25 17 24   Temp:      TempSrc:      SpO2: 100% 100%  100%  Weight:      Height:          Constitutional: NAD, calm, comfortable Vitals:   05/16/16 0630 05/16/16 0700 05/16/16 0730 05/16/16 0745  BP: 106/77  104/81   Pulse: 78 75  84  Resp: 17 25 17 24   Temp:      TempSrc:      SpO2: 100% 100%  100%  Weight:      Height:       Eyes: PERRL, lids and conjunctivae normal ENMT: on Bipap now.   Neck: norma Respiratory: bilateral basal crackle and coarse breath  sound, no wheeze, no accessory muscle use Cardiovascular: Regular rate and rhythm, no murmurs / rubs / gallops. No extremity edema. 2+ pedal pulses. Abdomen: no tenderness, no masses palpated. No hepatosplenomegaly. Bowel sounds positive.  Musculoskeletal: no clubbing / cyanosis. No joint deformity upper and lower extremities. Good ROM, no contractures. Normal muscle tone.  Skin: no rashes, lesions, ulcers. No induration Neurologic: alert, awake, Ox3.Strength 5/5 in all 4.  Psychiatric: Normal judgment and insight. Alert and oriented x 3. Normal mood.    Labs on Admission: I have personally reviewed following labs and imaging studies  CBC:  Recent Labs Lab 05/13/16 1020 05/16/16 0317  WBC 7.5 7.9  NEUTROABS 4,650  --   HGB 11.2* 11.1*  HCT 34.8* 33.3*  MCV 84.7 85.2  PLT 437* [id number]   Basic Metabolic Panel:  Recent Labs Lab 05/13/16 1020 05/16/16 0317  NA 141  141  K 4.0 3.6  CL 110 112*  CO2 19* 23  GLUCOSE 112* 101*  BUN 13 12  CREATININE 0.73 0.74  CALCIUM 9.6 9.2   GFR: Estimated Creatinine Clearance: 57.7 mL/min (by C-G formula based on SCr of 0.74 mg/dL). Liver Function Tests: No results for input(s): AST, ALT, ALKPHOS, BILITOT, PROT, ALBUMIN in the last 168 hours. No results for input(s): LIPASE, AMYLASE in the last 168 hours. No results for input(s): AMMONIA in the last 168 hours. Coagulation Profile:  Recent Labs Lab 05/16/16 0322  INR 1.00   Cardiac Enzymes:  Recent Labs Lab 05/16/16 0317 05/16/16 0537  TROPONINI 0.03* 0.03*   BNP (last 3 results) No results for input(s): PROBNP in the last 8760 hours. HbA1C: No results for input(s): HGBA1C in the last 72 hours. CBG: No results for input(s): GLUCAP in the last 168 hours. Lipid Profile: No results for input(s): CHOL, HDL, LDLCALC, TRIG, CHOLHDL, LDLDIRECT in the last 72 hours. Thyroid Function Tests: No results for input(s): TSH, T4TOTAL, FREET4, T3FREE, THYROIDAB in the last 72 hours. Anemia  Panel: No results for input(s): VITAMINB12, FOLATE, FERRITIN, TIBC, IRON, RETICCTPCT in the last 72 hours. Urine analysis:    Component Value Date/Time   COLORURINE YELLOW 03/17/2016 2036   APPEARANCEUR CLEAR 03/17/2016 2036   LABSPEC >1.030 (H) 03/17/2016 2036   PHURINE 5.5 03/17/2016 2036   GLUCOSEU NEGATIVE 03/17/2016 2036   HGBUR NEGATIVE 03/17/2016 2036   BILIRUBINUR NEGATIVE 03/17/2016 2036   KETONESUR TRACE (A) 03/17/2016 2036   PROTEINUR NEGATIVE 03/17/2016 2036   NITRITE NEGATIVE 03/17/2016 2036   LEUKOCYTESUR NEGATIVE 03/17/2016 2036   Sepsis Labs: !!!!!!!!!!!!!!!!!!!!!!!!!!!!!!!!!!!!!!!!!!!! @LABRCNTIP (procalcitonin:4,lacticidven:4) )No results found for this or any previous visit (from the past 240 hour(s)).   Radiological Exams on Admission: Dg Chest 2 View  Result Date: 05/16/2016 CLINICAL DATA:  Acute onset of mid chest tightness, shortness of breath and dizziness. Initial encounter. EXAM: CHEST  2 VIEW COMPARISON:  Chest radiograph performed 03/24/2016 FINDINGS: The lungs are well-aerated. Vascular congestion is noted. Increased interstitial markings raise concern for mild pulmonary edema. There is no evidence of pleural effusion or pneumothorax. The heart is normal in size; the mediastinal contour is within normal limits. No acute osseous abnormalities are seen. Clips are noted within the right upper quadrant, reflecting prior cholecystectomy. IMPRESSION: Vascular congestion noted. Increased interstitial markings raise concern for mild pulmonary edema. Electronically Signed   By: Garald Balding M.D.   On: 05/16/2016 04:00    EKG: Independently reviewed. Sinus rhythm, biatrial enlargement, T-wave inversion in lead v6. Unchanged from before.  Assessment/Plan Active Problems:   Acute CHF (congestive heart failure) (HCC)   Acute on chronic systolic CHF (congestive heart failure) (HCC)  #Acute on chronic systolic congestive heart failure: Patient had echocardiogram in  November 2017 which was consistent with left ventricular ejection fraction of 30-35%. Patient was not taking diuretics and eating high salt diet at home. -Patient has elevated troponin borderline, elevated BNP and chest x-ray consistent with acute pulmonary edema. -Received IV Lasix and BiPAP in the ER without any improvement in symptoms -We will admit in a stepdown unit, continue BiPAP and wean as tolerated. -IV Lasix twice a day, monitor BMP, urine output, Foley catheter, daily weight -Repeat echocardiogram, serial troponin -Cardiology consulted by ER. I also requested consult via Epic. -Check lipid panel, A1c, TSH.  #Acute hypoxic respiratory failure in the setting of congestive heart failure: Continue BiPAP weaned to nasal cannula as tolerated. -DuoNeb nebulizer -Respiratory therapy consulted -d-dimer  not significantly elevated therefore less likely thromboembolic disease.  #Coronary artery disease status post stent placement: Continue aspirin, Plavix, Coreg, losartan and statin.  #Hypertension: Her pressure acceptable. Continue current cardiac medication. Monitor blood pressure.  DVT prophylaxis: Lovenox subcutaneous Code Status: Full code Family Communication: Discussed with the patient's fianc at bedside on patient's permission. Disposition Plan: Admit to stepdown unit Consults called: Cardiology consult Admission status: Inpatient   Keishana Klinger Tanna Furry MD [facility] Pager [phone]  If 7PM-7AM, please contact night-coverage [email or web address] Password [facility]  05/16/2016, 8:19 AM

## 2016-05-16 NOTE — ED Notes (Signed)
Patient removed BPAP. States that she will not wear it. Respiratory alerted. Doctor orders are to ween off BPAP. Respiratory states place pt on 4L O2 via nasal cannula.   Pt placed on 4L O2 via [place].

## 2016-05-16 NOTE — ED Notes (Signed)
Ultrasound at bedside

## 2016-05-16 NOTE — Progress Notes (Signed)
*  PRELIMINARY RESULTS* Echocardiogram 2D Echocardiogram has been performed.  Leavy Cella 05/16/2016, 4:18 PM

## 2016-05-16 NOTE — ED Notes (Signed)
CRITICAL VALUE ALERT  Critical value received: Troponin 0.03 Date of notification: 05/16/16 Time of notification: [id number] Critical value read back:Yes.   Nurse who received alert:  GMP MD notified (1st page): Dr. Tomi Bamberger Time of first page: [phone] Responding MD:  [phone] Time MD responded: [phone]

## 2016-05-16 NOTE — ED Triage Notes (Signed)
Pt c/o mid center chest tightness that started tonight with sob and dizziness,

## 2016-05-17 DIAGNOSIS — I5043 Acute on chronic combined systolic (congestive) and diastolic (congestive) heart failure: Secondary | ICD-10-CM

## 2016-05-17 DIAGNOSIS — I5023 Acute on chronic systolic (congestive) heart failure: Secondary | ICD-10-CM

## 2016-05-17 DIAGNOSIS — I252 Old myocardial infarction: Secondary | ICD-10-CM

## 2016-05-17 DIAGNOSIS — E785 Hyperlipidemia, unspecified: Secondary | ICD-10-CM

## 2016-05-17 DIAGNOSIS — Z955 Presence of coronary angioplasty implant and graft: Secondary | ICD-10-CM

## 2016-05-17 DIAGNOSIS — I25119 Atherosclerotic heart disease of native coronary artery with unspecified angina pectoris: Secondary | ICD-10-CM

## 2016-05-17 DIAGNOSIS — I248 Other forms of acute ischemic heart disease: Secondary | ICD-10-CM

## 2016-05-17 DIAGNOSIS — E876 Hypokalemia: Secondary | ICD-10-CM

## 2016-05-17 LAB — BASIC METABOLIC PANEL
Anion gap: 7 (ref 5–15)
BUN: 17 mg/dL (ref 6–20)
CO2: 26 mmol/L (ref 22–32)
Calcium: 9 mg/dL (ref 8.9–10.3)
Chloride: 104 mmol/L (ref 101–111)
Creatinine, Ser: 0.79 mg/dL (ref 0.44–1.00)
GFR calc Af Amer: >60 mL/min (ref >60)
GFR calc non Af Amer: >60 mL/min (ref >60)
Glucose, Bld: 97 mg/dL (ref 65–99)
Potassium: 3.2 mmol/L — ABNORMAL LOW (ref 3.5–5.1)
Sodium: 137 mmol/L (ref 135–145)

## 2016-05-17 LAB — CBC
HCT: 34 % — ABNORMAL LOW (ref 36.0–46.0)
Hemoglobin: 11.5 g/dL — ABNORMAL LOW (ref 12.0–15.0)
MCH: 28.3 pg (ref 26.0–34.0)
MCHC: 33.8 g/dL (ref 30.0–36.0)
MCV: 83.5 fL (ref 78.0–100.0)
Platelets: 385 10*3/uL (ref 150–400)
RBC: 4.07 MIL/uL (ref 3.87–5.11)
RDW: 15.3 % (ref 11.5–15.5)
WBC: 8 10*3/uL (ref 4.0–10.5)

## 2016-05-17 LAB — LIPID PANEL
Cholesterol: 107 mg/dL (ref 0–200)
HDL: 41 mg/dL (ref >40)
LDL Cholesterol: 32 mg/dL (ref 0–99)
Total CHOL/HDL Ratio: 2.6 RATIO
Triglycerides: 168 mg/dL — ABNORMAL HIGH (ref <150)
VLDL: 34 mg/dL (ref 0–40)

## 2016-05-17 LAB — MAGNESIUM: Magnesium: 2 mg/dL (ref 1.7–2.4)

## 2016-05-17 LAB — HEMOGLOBIN A1C
Hgb A1c MFr Bld: 5.4 % (ref 4.8–5.6)
Mean Plasma Glucose: 108 mg/dL

## 2016-05-17 MED ORDER — POTASSIUM CHLORIDE CRYS ER 20 MEQ PO TBCR
40.0000 meq | EXTENDED_RELEASE_TABLET | Freq: Every day | ORAL | Status: DC
  Administered 2016-05-17 – 2016-05-18 (×2): 40 meq via ORAL
  Filled 2016-05-17 (×2): qty 2

## 2016-05-17 MED ORDER — ALUM & MAG HYDROXIDE-SIMETH 200-200-20 MG/5ML PO SUSP
30.0000 mL | ORAL | Status: DC | PRN
  Administered 2016-05-18: 30 mL via ORAL
  Filled 2016-05-17: qty 30

## 2016-05-17 MED ORDER — POTASSIUM CHLORIDE CRYS ER 20 MEQ PO TBCR: 20.0000 meq | EXTENDED_RELEASE_TABLET | Freq: Two times a day (BID) | ORAL | Status: DC

## 2016-05-17 MED ORDER — SACUBITRIL-VALSARTAN 24-26 MG PO TABS
1.0000 | ORAL_TABLET | Freq: Two times a day (BID) | ORAL | Status: DC
  Administered 2016-05-17: 1 via ORAL
  Filled 2016-05-17 (×6): qty 1

## 2016-05-17 MED ORDER — SACUBITRIL-VALSARTAN 24-26 MG PO TABS: 1.0000 | ORAL_TABLET | Freq: Two times a day (BID) | ORAL | Status: DC

## 2016-05-17 NOTE — Consult Note (Signed)
CARDIOLOGY CONSULT NOTE   Patient ID: Erin [name] MRN: [id number] DOB/AGE: 07-10-1960 56 y.o.  Admit Date: 05/16/2016 Referring Phys[facility] Sicks MD Primary Physician: Jonathon Bellows, MD Consulting Cardiologist: Kate Sable Reason for Consultation: CHF  Clinical Summary Erin [name] is a 56 y.o.female with known history of chronic dyspnea, anxiety, chronic chest tightness, coronary artery disease with drug-eluting stent to the right coronary artery and PDA, 2, ischemic cardiomyopathy with most recent ejection fraction of 30-35% on last office visit on 05/13/2016 she was continuing to complain of shortness of breath and feeling like her lungs are being filled with cold air when she took a deep breath. She was continued on carvedilol, and losartan, and did not have evidence of decompensation at this time. Blood pressure was elevated and she was quite anxious. She was taken off of lasix in the past due to significant hypotension during recent admission to [facility].    She admits to eating pizza for dinner that evening and awoke with sudden dyspnea and PND The patient presented to the emergency room with complaints of chest pain, and dyspnea. On arrival the patient's blood pressure was 120/96 heart rate 80 respirations 18 O2 sat 19% on room air. Blood pressure did rise to 148/101.EKG NSR with non-specific T-wave abnormalities in the inferior leads.  Chest x-ray revealed vascular congestion increased interstitial markings, mild pulmonary edema.    Labs reveal hemoglobin 11.5 hematocrit 35.4, platelets 392, there was no leukocytosis. Sodium 141 potassium 3.6 chloride 112 glucose 101 creatinine 0.74. This morning early she was treated with aspirin, breathing treatments, morphine, given losartan 12.5 mg by mouth, started on Lasix 60 mg IV, and continued on home medications. She is diuresed 2.9 cc's overnight.     No Known Allergies  Medications Scheduled Medications: . aspirin EC  81 mg  Oral Daily  . atorvastatin  80 mg Oral q1800  . carvedilol  3.125 mg Oral BID WC  . clopidogrel  75 mg Oral Daily  . enoxaparin (LOVENOX) injection  40 mg Subcutaneous Q24H  . furosemide  60 mg Intravenous Q12H  . losartan  12.5 mg Oral QPM  . sodium chloride flush  3 mL Intravenous Q12H     Infusions: . albuterol Stopped (05/16/16 0746)     PRN Medications:  sodium chloride, acetaminophen, ipratropium, LORazepam, nitroGLYCERIN, ondansetron (ZOFRAN) IV, sodium chloride flush   Past Medical History:  Diagnosis Date  . Anxiety state 03/25/2016  . Basal cell carcinoma of forehead   . Coronary artery disease    a. 03/11/16 PCI with DES-->Prox/Mid Cx, 03/14/16 PCI with DES x2-->RCA, EF 30-35%  . Essential hypertension   . GERD (gastroesophageal reflux disease)   . History of pneumonia   . Hyperlipidemia   . Ischemic cardiomyopathy   . Mitral regurgitation   . NSTEMI (non-ST elevated myocardial infarction) ([place]) 03/10/2016  . Squamous cell cancer of skin of nose   . Thrombocytosis ([place]) 03/26/2016  . Wears dentures   . Wears glasses     Past Surgical History:  Procedure Laterality Date  . APPENDECTOMY    . CARDIAC CATHETERIZATION N/A 03/11/2016   Procedure: Left Heart Cath and Coronary Angiography;  Surgeon: Leonie Man, MD;  Location: [place] [facility];  Service: Cardiovascular;  Laterality: N/A;  . CARDIAC CATHETERIZATION N/A 03/11/2016   Procedure: Coronary Stent Intervention;  Surgeon: Leonie Man, MD;  Location: [place] [facility];  Service: Cardiovascular;  Laterality: N/A;  . CARDIAC CATHETERIZATION N/A 03/14/2016  Procedure: Coronary Stent Intervention;  Surgeon: Peter M Martinique, MD;  Location: [place] [facility];  Service: Cardiovascular;  Laterality: N/A;  . CHOLECYSTECTOMY OPEN  1984  . CORONARY ANGIOPLASTY WITH STENT PLACEMENT  03/14/2016  . FINGER ARTHROPLASTY Left 05/14/2013   Procedure: LEFT THUMB CARPAL METACARPAL ARTHROPLASTY;  Surgeon: Tennis Must,  MD;  Location: [place];  Service: Orthopedics;  Laterality: Left;  . TUBAL LIGATION  1987  . VAGINAL HYSTERECTOMY  2009    Family History  Problem Relation Age of Onset  . Stroke Mother   . Hypertension Mother   . Diabetes Mother   . Heart failure Other      Social History Erin [name] reports that she has quit smoking. Her smoking use included Cigarettes. She has a 15.00 pack-year smoking history. She has never used smokeless tobacco. Erin [name] reports that she drinks about 3.6 oz of alcohol per week .  Review of Systems Complete review of systems are found to be negative unless outlined in H&P above.  Physical Examination Blood pressure (!) 90/58, pulse 72, temperature 98.2 F (36.8 C), temperature source Oral, resp. rate 16, height 5\' 5"  (1.651 m), weight 111 lb 12.4 oz (50.7 kg), SpO2 98 %.  Intake/Output Summary (Last 24 hours) at 05/17/16 0802 Last data filed at 05/17/16 0500  Gross per 24 hour  Intake                6 ml  Output             1750 ml  Net            -1744 ml    Telemetry: NSR   GEN: No acute distress  HEENT: Conjunctiva and lids normal, oropharynx clear with moist mucosa. Neck: Supple, no elevated JVP or carotid bruits, no thyromegaly. Lungs: Clear to auscultation, nonlabored breathing at rest. Cardiac: Regular rate and rhythm, no S3 or significant systolic murmur, no pericardial rub. Abdomen: Soft, nontender, no hepatomegaly, bowel sounds present, no guarding or rebound. Extremities: No pitting edema, distal pulses 2+. Skin: Warm and dry. Musculoskeletal: No kyphosis. Neuropsychiatric: Alert and oriented x3, affect grossly appropriate.  Prior Cardiac Testing/Procedures  1.Echocardiogram 05/17/2016 Left ventricle: The cavity size was normal. Wall thickness was   increased in a pattern of mild LVH. Systolic function was   moderately reduced. The estimated ejection fraction was in the   range of 35% to 40%. There is akinesis of the    basal-midinferolateral, inferior, and inferoseptal myocardium.   Doppler parameters are consistent with abnormal left ventricular   relaxation (grade 1 diastolic dysfunction). - Aortic valve: Mildly calcified annulus. Trileaflet. - Mitral valve: There was mild regurgitation. - Tricuspid valve: There was trivial regurgitation. - Pulmonary arteries: PA peak pressure: 22 mm Hg (S). - Pericardium, extracardiac: A small to moderate pericardial   effusion was identified circumferential to the heart. No obvious   right ventricular compromise. Mitral inflow with respiration does   not suggest definitive tamponade physiology.  Cardiac Cath 03/14/2016 Conclusion     Mid Cx lesion, 0 %stenosed.  A drug eluting .  A STENT PROMUS PREM MR 2.25X16 drug eluting stent was successfully placed.  A STENT PROMUS PREM MR 2.25X8 drug-eluting stent was successfully placed.  Dist RCA lesion, 60 %stenosed.  Post intervention, there is a 0% residual stenosis.  Post Atrio lesion, 0 %stenosed.  Post intervention, there is a 0% residual stenosis.  A stent was successfully placed.   1. Successful Stenting  of the distal RCA/PDA with DES x 2.    Lab Results  Basic Metabolic Panel:  Recent Labs Lab 05/13/16 1020 05/16/16 0317 05/16/16 1304 05/17/16 0536  NA 141 141  --  137  K 4.0 3.6  --  3.2*  CL 110 112*  --  104  CO2 19* 23  --  26  GLUCOSE 112* 101*  --  97  BUN 13 12  --  17  CREATININE 0.73 0.74 0.74 0.79  CALCIUM 9.6 9.2  --  9.0  MG  --   --   --  2.0    CBC:  Recent Labs Lab 05/13/16 1020 05/16/16 0317 05/16/16 1304 05/17/16 0536  WBC 7.5 7.9 9.5 8.0  NEUTROABS 4,650  --   --   --   HGB 11.2* 11.1* 11.5* 11.5*  HCT 34.8* 33.3* 35.4* 34.0*  MCV 84.7 85.2 85.1 83.5  PLT 437* 378 392 385    Cardiac Enzymes:  Recent Labs Lab 05/16/16 0317 05/16/16 0537 05/16/16 1304  TROPONINI 0.03* 0.03* 0.05*    BNP: 783.0   Radiology: Dg Chest 2 View  Result Date:  05/16/2016 CLINICAL DATA:  Acute onset of mid chest tightness, shortness of breath and dizziness. Initial encounter. EXAM: CHEST  2 VIEW COMPARISON:  Chest radiograph performed 03/24/2016 FINDINGS: The lungs are well-aerated. Vascular congestion is noted. Increased interstitial markings raise concern for mild pulmonary edema. There is no evidence of pleural effusion or pneumothorax. The heart is normal in size; the mediastinal contour is within normal limits. No acute osseous abnormalities are seen. Clips are noted within the right upper quadrant, reflecting prior cholecystectomy. IMPRESSION: Vascular congestion noted. Increased interstitial markings raise concern for mild pulmonary edema. Electronically Signed   By: Garald Balding M.D.   On: 05/16/2016 04:00     ECG: NSR with non-specific T-wave abnormalities in the inferior leads. HR 82 bpm.    Impression and Recommendations:  1. Acute systolic heart failure:  Multifactorial, with dietary indiscretion, and not on any regular regimen of diuretics. These were discontinued in November 2017, during hospitalization secondary to significant hypotension. The patient will likely need to be on by mouth diuretics on discharge. Continue to diurese if she has responded well to IV Lasix. Creatinine this a.m. 0.79. CO2 26. Replete potassium as it is 3.2 this morning.  2. Ischemic cardiomyopathy: Reported ejection fraction per echo completed yesterday demonstrated EF 35%-40% which is an improvement from prior echocardiogram in November. Continue carvedilol, she is currently on ARB, and would likely benefit from use of Entresto. We'll discontinue ARB, and begin Entresto tomorrow evening, 24/26 mg twice a day. Consider adding spironolactone at later date.  3. Hypotension: Blood pressure is low normal for reported EF, although improved from prior echocardiogram. Continue coreg. Transition to [facility] tomorrow evening and hold losartan.   4. CAD: Drug-eluting stents to  RCA and PDA. November 2017. Continue statin therapy, ASA. BB. Plavix.    Signed: Phill Myron. Lawrence NP [facility]  05/17/2016, 8:02 AM Co-Sign MD  The patient was seen and examined, and I agree with the history, physical exam, assessment and plan as documented above, with modifications as noted below. Pt with CAD and ischemic cardiomyopathy admitted with acute systolic heart failure on 05/16/16. Had eaten salad without dressing and "a small piece of pizza with pepperoni and steak". Feeling much better and denies chest pain with markedly decreased complaints of dyspnea.  Has diuresed nearly 4 L on IV Lasix in past 48 hrs. Denies  chest pain. ECG unchanged from 03/2016. Minimal troponin elevation consistent with demand ischemia. Switched from Brilinta to Plavix while hospitalized.  1. Acute systolic heart failure: Would continue IV diuresis today and switch to Lasix 20 mg daily 1/10. Agree with switch from losartan to Entresto. Will need to watch for hypotension. Continue Coreg.  2. CAD: Continue ASA and would recommend switching back to Brilinta. If she has episodic shortness of breath at home, would then consider switch to Plavix. Had NSTEMI with PCI x 2 in 03/2016 as noted above. Continue high dose Lipitor as LDL now within normal limits. Continue Coreg.  Dispo: Consider possible discharge 05/18/16 if she remains stable.  Kate Sable, MD, [facility]  05/17/2016 9:48 AM

## 2016-05-17 NOTE — Progress Notes (Signed)
PROGRESS NOTE    Erin [name]  [id number] DOB: July 27, 1960 DOA: 05/16/2016 PCP: Jonathon Bellows, MD   Brief Narrative: 56 y.o. female with medical history significant of hypertension, acid reflux, ischemic cardiomyopathy, coronary artery disease status post drug-eluting stent on on November 3, left ventricular ejection fraction of 30-35%, not on diuretics at home presented with worsening shortness of breath, dyspnea on exertion and left-sided chest pain. Patient required BiPAP on admission. Cardiology following.  Assessment & Plan:   Active Problems:   Acute CHF (congestive heart failure) (HCC)   Acute on chronic systolic CHF (congestive heart failure) ([place])  #Acute on chronic systolic congestive heart failure: Patient had echocardiogram in November 2017 which was consistent with left ventricular ejection fraction of 30-35%. Patient was not taking diuretics and eating high salt diet at home. -Patient has elevated troponin likely demand ischemia, elevated BNP and chest x-ray consistent with acute pulmonary edema. -Initially required BiPAP and transitioned to nasal cannula. -Continue IV Lasix twice a day, monitor BMP, urine output, Foley catheter, daily weight. Patient is responding well with IV diuretics with 3.5 L negative. Likely able to switch to oral Lasix tomorrow. -Repeat echocardiogram with left ventricular ejection fraction of 35-40% and grade 1 diastolic dysfunction. Not significant changed from prior echo. -Cardiology consult appreciated. -LDL of 32, TSH acceptable.  #Acute hypoxic respiratory failure in the setting of congestive heart failure: Currently on nasal cannula requiring about 3 L of oxygen. Try to wean gradually to room air. -DuoNeb nebulizer -d-dimer not significantly elevated therefore less likely thromboembolic disease.  #Coronary artery disease status post stent placement: Continue aspirin, Plavix, Coreg, losartan and statin. Follow-up cardiologist. Monitor  blood pressure closely.  #Hypertension: Blood pressure borderline low this morning. Patient asymptomatic. Currently on multiple cardiac medication and IV Lasix. Continue to monitor closely.  #Hypokalemia likely in the setting of diuretics. Magnesium level acceptable. Repleted potassium chloride. Monitor labs.  DVT prophylaxis: Lovenox subcutaneous Code Status: Full code Family Communication: No family present at bedside Disposition Plan: Likely discharge home in 1-2 days    Consultants:   Cardiologist  Procedures: Echocardiogram Antimicrobials: None  Subjective: Patient was seen and examined at bedside today. She reported feeling significantly better. Currently on nasal cannula. Reported that her shortness of breath is better. She wants to take the Foley catheter out. Denied nausea, vomiting, dizziness, lightheadedness, chest pain.   Objective: Vitals:   05/16/16 1956 05/17/16 0559 05/17/16 0828 05/17/16 1345  BP: 111/67 (!) 90/58 (!) 96/58 (!) 94/56  Pulse: 65 72 89 75  Resp: 16 16  18   Temp: 97.8 F (36.6 C) 98.2 F (36.8 C)  98.4 F (36.9 C)  TempSrc: Oral Oral  Oral  SpO2: 100% 98%  97%  Weight: 50.7 kg (111 lb 12.8 oz) 50.7 kg (111 lb 12.4 oz)    Height: 5\' 5"  (1.651 m)       Intake/Output Summary (Last 24 hours) at 05/17/16 1532 Last data filed at 05/17/16 1313  Gross per 24 hour  Intake              483 ml  Output             2350 ml  Net            -1867 ml   Filed Weights   05/16/16 0309 05/16/16 1956 05/17/16 0559  Weight: 53.1 kg (117 lb) 50.7 kg (111 lb 12.8 oz) 50.7 kg (111 lb 12.4 oz)    Examination:  General exam: Appears  calm and comfortable  Respiratory system: Clear to auscultation. Respiratory effort normal. No wheezing or crackle Cardiovascular system: S1 & S2 heard, RRR.  No pedal edema. Gastrointestinal system: Abdomen is nondistended, soft and nontender. Normal bowel sounds heard. Central nervous system: Alert and oriented. No focal  neurological deficits. Extremities: Symmetric 5 x 5 power. Skin: No rashes, lesions or ulcers Psychiatry: Judgement and insight appear normal. Mood & affect appropriate.     Data Reviewed: I have personally reviewed following labs and imaging studies  CBC:  Recent Labs Lab 05/13/16 1020 05/16/16 0317 05/16/16 1304 05/17/16 0536  WBC 7.5 7.9 9.5 8.0  NEUTROABS 4,650  --   --   --   HGB 11.2* 11.1* 11.5* 11.5*  HCT 34.8* 33.3* 35.4* 34.0*  MCV 84.7 85.2 85.1 83.5  PLT 437* 378 392 0000000   Basic Metabolic Panel:  Recent Labs Lab 05/13/16 1020 05/16/16 0317 05/16/16 1304 05/17/16 0536  NA 141 141  --  137  K 4.0 3.6  --  3.2*  CL 110 112*  --  104  CO2 19* 23  --  26  GLUCOSE 112* 101*  --  97  BUN 13 12  --  17  CREATININE 0.73 0.74 0.74 0.79  CALCIUM 9.6 9.2  --  9.0  MG  --   --   --  2.0   GFR: Estimated Creatinine Clearance: 64.3 mL/min (by C-G formula based on SCr of 0.79 mg/dL). Liver Function Tests: No results for input(s): AST, ALT, ALKPHOS, BILITOT, PROT, ALBUMIN in the last 168 hours. No results for input(s): LIPASE, AMYLASE in the last 168 hours. No results for input(s): AMMONIA in the last 168 hours. Coagulation Profile:  Recent Labs Lab 05/16/16 0322  INR 1.00   Cardiac Enzymes:  Recent Labs Lab 05/16/16 0317 05/16/16 0537 05/16/16 1304  TROPONINI 0.03* 0.03* 0.05*   BNP (last 3 results) No results for input(s): PROBNP in the last 8760 hours. HbA1C:  Recent Labs  05/16/16 1304  HGBA1C 5.4   CBG: No results for input(s): GLUCAP in the last 168 hours. Lipid Profile:  Recent Labs  05/17/16 0536  CHOL 107  HDL 41  LDLCALC 32  TRIG 168*  CHOLHDL 2.6   Thyroid Function Tests:  Recent Labs  05/16/16 1304  TSH 0.937   Anemia Panel: No results for input(s): VITAMINB12, FOLATE, FERRITIN, TIBC, IRON, RETICCTPCT in the last 72 hours. Sepsis Labs: No results for input(s): PROCALCITON, LATICACIDVEN in the last 168 hours.  No  results found for this or any previous visit (from the past 240 hour(s)).       Radiology Studies: Dg Chest 2 View  Result Date: 05/16/2016 CLINICAL DATA:  Acute onset of mid chest tightness, shortness of breath and dizziness. Initial encounter. EXAM: CHEST  2 VIEW COMPARISON:  Chest radiograph performed 03/24/2016 FINDINGS: The lungs are well-aerated. Vascular congestion is noted. Increased interstitial markings raise concern for mild pulmonary edema. There is no evidence of pleural effusion or pneumothorax. The heart is normal in size; the mediastinal contour is within normal limits. No acute osseous abnormalities are seen. Clips are noted within the right upper quadrant, reflecting prior cholecystectomy. IMPRESSION: Vascular congestion noted. Increased interstitial markings raise concern for mild pulmonary edema. Electronically Signed   By: Garald Balding M.D.   On: 05/16/2016 04:00        Scheduled Meds: . aspirin EC  81 mg Oral Daily  . atorvastatin  80 mg Oral q1800  . carvedilol  3.125 mg Oral BID WC  . clopidogrel  75 mg Oral Daily  . enoxaparin (LOVENOX) injection  40 mg Subcutaneous Q24H  . furosemide  60 mg Intravenous Q12H  . potassium chloride  40 mEq Oral Daily  . sacubitril-valsartan  1 tablet Oral BID  . sodium chloride flush  3 mL Intravenous Q12H   Continuous Infusions: . albuterol Stopped (05/16/16 0746)     LOS: 1 day    Alcario Tinkey Tanna Furry, MD [facility] Pager [phone]  If 7PM-7AM, please contact night-coverage [email or web address] Password [facility] 05/17/2016, 3:32 PM

## 2016-05-17 NOTE — Progress Notes (Signed)
Patient has two active orders for Potassium.  Dr. Carolin Sicks notified and gave order to discontinue the potassium 20 meq bid.

## 2016-05-18 ENCOUNTER — Encounter (HOSPITAL_COMMUNITY)
Admission: RE | Admit: 2016-05-18 | Discharge: 2016-05-18 | Disposition: A | Discharge status: Home / Self Care | Payer: PRIVATE HEALTH INSURANCE | Source: Ambulatory Visit | Attending: Cardiology | Admitting: Cardiology

## 2016-05-18 ENCOUNTER — Inpatient Hospital Stay (HOSPITAL_COMMUNITY): Admission: RE | Admit: 2016-05-18 | Payer: Self-pay | Source: Ambulatory Visit

## 2016-05-18 DIAGNOSIS — I214 Non-ST elevation (NSTEMI) myocardial infarction: Secondary | ICD-10-CM | POA: Insufficient documentation

## 2016-05-18 DIAGNOSIS — Z955 Presence of coronary angioplasty implant and graft: Secondary | ICD-10-CM | POA: Insufficient documentation

## 2016-05-18 LAB — BASIC METABOLIC PANEL
Anion gap: 8 (ref 5–15)
BUN: 20 mg/dL (ref 6–20)
CO2: 29 mmol/L (ref 22–32)
Calcium: 9.7 mg/dL (ref 8.9–10.3)
Chloride: 98 mmol/L — ABNORMAL LOW (ref 101–111)
Creatinine, Ser: 1.06 mg/dL — ABNORMAL HIGH (ref 0.44–1.00)
GFR calc Af Amer: >60 mL/min (ref >60)
GFR calc non Af Amer: 58 mL/min — ABNORMAL LOW (ref >60)
Glucose, Bld: 132 mg/dL — ABNORMAL HIGH (ref 65–99)
Potassium: 3.9 mmol/L (ref 3.5–5.1)
Sodium: 135 mmol/L (ref 135–145)

## 2016-05-18 MED ORDER — FUROSEMIDE 20 MG PO TABS: 20.0000 mg | ORAL_TABLET | Freq: Every day | ORAL | 0 refills | Status: DC

## 2016-05-18 MED ORDER — HYDROCORTISONE 1 % EX CREA: TOPICAL_CREAM | CUTANEOUS | Status: DC | PRN

## 2016-05-18 MED ORDER — LOSARTAN POTASSIUM 25 MG PO TABS: 12.5000 mg | ORAL_TABLET | Freq: Every evening | ORAL | 0 refills | Status: DC

## 2016-05-18 MED ORDER — HYDROCORTISONE 0.5 % EX CREA
TOPICAL_CREAM | CUTANEOUS | Status: DC | PRN
  Filled 2016-05-18: qty 28.35

## 2016-05-18 NOTE — Discharge Instructions (Signed)
Follow with Primary MD WEBB, CAROL D, MD in 2 days   Get CBC, CMP, 2 view Chest X ray checked  by Primary MD  in 2 days ( we routinely change or add medications that can affect your baseline labs and fluid status, therefore we recommend that you get the mentioned basic workup next visit with your PCP, your PCP may decide not to get them or add new tests based on their clinical decision)   Activity: As tolerated with Full fall precautions use walker/cane & assistance as needed   Disposition Home    Diet:  Heart Healthy   Check your Weight same time everyday, if you gain over 2 pounds, or you develop in leg swelling, experience more shortness of breath or chest pain, call your Primary MD immediately. Follow Cardiac Low Salt Diet and 1.5 lit/day fluid restriction.   On your next visit with your primary care physician please Get Medicines reviewed and adjusted.   Please request your Prim.MD to go over all Hospital Tests and Procedure/Radiological results at the follow up, please get all Hospital records sent to your Prim MD by signing hospital release before you go home.   If you experience worsening of your admission symptoms, develop shortness of breath, life threatening emergency, suicidal or homicidal thoughts you must seek medical attention immediately by calling 911 or calling your MD immediately  if symptoms less severe.  You Must read complete instructions/literature along with all the possible adverse reactions/side effects for all the Medicines you take and that have been prescribed to you. Take any new Medicines after you have completely understood and accpet all the possible adverse reactions/side effects.   Do not drive, operate heavy machinery, perform activities at heights, swimming or participation in water activities or provide baby sitting services if your were admitted for syncope or siezures until you have seen by Primary MD or a Neurologist and advised to do so again.  Do  not drive when taking Pain medications.    Do not take more than prescribed Pain, Sleep and Anxiety Medications  Special Instructions: If you have smoked or chewed Tobacco  in the last 2 yrs please stop smoking, stop any regular Alcohol  and or any Recreational drug use.  Wear Seat belts while driving.   Please note  You were cared for by a hospitalist during your hospital stay. If you have any questions about your discharge medications or the care you received while you were in the hospital after you are discharged, you can call the unit and asked to speak with the hospitalist on call if the hospitalist that took care of you is not available. Once you are discharged, your primary care physician will handle any further medical issues. Please note that NO REFILLS for any discharge medications will be authorized once you are discharged, as it is imperative that you return to your primary care physician (or establish a relationship with a primary care physician if you do not have one) for your aftercare needs so that they can reassess your need for medications and monitor your lab values.

## 2016-05-18 NOTE — Progress Notes (Signed)
     Erin [name] was admitted to the Hospital on 05/16/2016 and Discharged  05/18/2016 and should be excused from work/school   for 4 days starting from date -  05/16/2016 , may return to work/school without any restrictions.  Call Lala Lund MD, [facility]  [phone] with questions.  Thurnell Lose M.D on 05/18/2016,at 11:04 AM  [facility]   Office  [phone]

## 2016-05-18 NOTE — Discharge Summary (Signed)
Erin [name] [id number] DOB: 01/29/1961 DOA: 05/16/2016  PCP: Jonathon Bellows, MD  Admit date: 05/16/2016  Discharge date: 05/18/2016  Admitted From: Home   Disposition:  Home   Recommendations for Outpatient Follow-up:   Follow up with PCP in 1-2 weeks  PCP Please obtain BMP/CBC, 2 view CXR in 1week,  (see Discharge instructions)   PCP Please follow up on the following pending results: Monitor BMP closely   Home Health: None   Equipment/Devices: None  Consultations: Cards Discharge Condition: Fair   CODE STATUS: Full   Diet Recommendation: Diet Heart Healthy, 1.5lit/day fluid restriction   Chief Complaint  Patient presents with  . Chest Pain     Brief history of present illness from the day of admission and additional interim summary    Erin [name] is a 56 y.o. female with medical history significant of hypertension, acid reflux, ischemic cardiomyopathy, coronary artery disease status post drug-eluting stent on on November 3, left ventricular ejection fraction of 30-35%, not on diuretics at home presented with worsening shortness of breath, dyspnea on exertion and left-sided chest pain.  Hospital issues addressed    1. Acute hypoxic respiratory failure due to acute on chronic systolic heart failure. EF 35%. Seen by cardiology, she was diuresed with IV Lasix, she was placed on Entresto, she is now symptom-free however her creatinine has bumped a little bit, she is off oxygen, no rales or edema on exam, fluid wise she is compensated. She will be discharged home on low-dose oral Lasix and low-dose ARB and Coreg, she will start Lasix and ARB 2 days from now to allow her renal function to improve. Plan discussed with cardiologist Dr. Bronson Ing.  2. ARF. Hold Lasix and ARB for 2 days PCP to monitor. Good  urine output.  3. CAD with ischemic cardiomyopathy. Stable continue dual antiplatelet therapy along with statin, Coreg for secondary prevention.  4. Dyslipidemia. On statin continue.  5. Hypertension. Continue combination of Coreg, Lasix and ARB. PCP to monitor and adjust.    Discharge diagnosis     Active Problems:   Acute CHF (congestive heart failure) (HCC)   Acute on chronic systolic CHF (congestive heart failure) (HCC)   Hypokalemia    Discharge instructions    Discharge Instructions    Discharge instructions    Complete by:  As directed    Follow with Primary MD WEBB, CAROL D, MD in 2 days   Get CBC, CMP, 2 view Chest X ray checked  by Primary MD  in 2 days ( we routinely change or add medications that can affect your baseline labs and fluid status, therefore we recommend that you get the mentioned basic workup next visit with your PCP, your PCP may decide not to get them or add new tests based on their clinical decision)   Activity: As tolerated with Full fall precautions use walker/cane & assistance as needed   Disposition Home    Diet:  Heart Healthy   Check your Weight same time everyday, if you  gain over 2 pounds, or you develop in leg swelling, experience more shortness of breath or chest pain, call your Primary MD immediately. Follow Cardiac Low Salt Diet and 1.5 lit/day fluid restriction.   On your next visit with your primary care physician please Get Medicines reviewed and adjusted.   Please request your Prim.MD to go over all Hospital Tests and Procedure/Radiological results at the follow up, please get all Hospital records sent to your Prim MD by signing hospital release before you go home.   If you experience worsening of your admission symptoms, develop shortness of breath, life threatening emergency, suicidal or homicidal thoughts you must seek medical attention immediately by calling 911 or calling your MD immediately  if symptoms less severe.  You  Must read complete instructions/literature along with all the possible adverse reactions/side effects for all the Medicines you take and that have been prescribed to you. Take any new Medicines after you have completely understood and accpet all the possible adverse reactions/side effects.   Do not drive, operate heavy machinery, perform activities at heights, swimming or participation in water activities or provide baby sitting services if your were admitted for syncope or siezures until you have seen by Primary MD or a Neurologist and advised to do so again.  Do not drive when taking Pain medications.    Do not take more than prescribed Pain, Sleep and Anxiety Medications  Special Instructions: If you have smoked or chewed Tobacco  in the last 2 yrs please stop smoking, stop any regular Alcohol  and or any Recreational drug use.  Wear Seat belts while driving.   Please note  You were cared for by a hospitalist during your hospital stay. If you have any questions about your discharge medications or the care you received while you were in the hospital after you are discharged, you can call the unit and asked to speak with the hospitalist on call if the hospitalist that took care of you is not available. Once you are discharged, your primary care physician will handle any further medical issues. Please note that NO REFILLS for any discharge medications will be authorized once you are discharged, as it is imperative that you return to your primary care physician (or establish a relationship with a primary care physician if you do not have one) for your aftercare needs so that they can reassess your need for medications and monitor your lab values.   Increase activity slowly    Complete by:  As directed       Discharge Medications   Allergies as of 05/18/2016   No Known Allergies     Medication List    TAKE these medications   acetaminophen 500 MG tablet Commonly known as:  TYLENOL Take  1 tablet (500 mg total) by mouth every 8 (eight) hours as needed for mild pain, moderate pain or headache.   aspirin EC 81 MG tablet Take 81 mg by mouth daily.   BRILINTA 90 MG Tabs tablet Generic drug:  ticagrelor Take 90 mg by mouth 2 (two) times daily.   carvedilol 3.125 MG tablet Commonly known as:  COREG Take 1 tablet (3.125 mg total) by mouth 2 (two) times daily with a meal.   furosemide 20 MG tablet Commonly known as:  LASIX Take 1 tablet (20 mg total) by mouth daily. Start taking on:  05/20/2016   LORazepam 0.5 MG tablet Commonly known as:  ATIVAN Take 1 tablet (0.5 mg total) by mouth every 12 (twelve)  hours as needed for anxiety. DO NOT DRIVE WHEN TAKING THIS MEDICATION. What changed:  how much to take  when to take this  reasons to take this  additional instructions   losartan 25 MG tablet Commonly known as:  COZAAR Take 0.5 tablets (12.5 mg total) by mouth every evening. Start taking on:  05/20/2016   nitroGLYCERIN 0.4 MG SL tablet Commonly known as:  NITROSTAT Place 1 tablet (0.4 mg total) under the tongue every 5 (five) minutes x 3 doses as needed for chest pain.       Follow-up Information    WEBB, CAROL D, MD. Schedule an appointment as soon as possible for a visit in 2 day(s).   Specialty:  Family Medicine Contact information: [place] [address] [phone]        Kate Sable, MD. Schedule an appointment as soon as possible for a visit in 1 week(s).   Specialty:  Cardiology Contact information: [place] [address] [phone]           Major procedures and Radiology Reports - PLEASE review detailed and final reports thoroughly  -     TTE  Mild LVH with LVEF approximately 35-40%. Mid to basal inferoseptal, inferior, and inferolateral akinesis consistent  with ischemic cardiomyopathy. Grade 1 diastolic dysfunction. Mild mitral regurgitation. Mildly calcified aortic annulus.  Trivial tricuspid regurgitation with PASP 22 mmHg. Small to moderate circumferential pericardial effusion noted. No obvious right ventricular compromise. Mitral inflow with respiration does not suggest definitive tamponade physiology.    Dg Chest 2 View  Result Date: 05/16/2016 CLINICAL DATA:  Acute onset of mid chest tightness, shortness of breath and dizziness. Initial encounter. EXAM: CHEST  2 VIEW COMPARISON:  Chest radiograph performed 03/24/2016 FINDINGS: The lungs are well-aerated. Vascular congestion is noted. Increased interstitial markings raise concern for mild pulmonary edema. There is no evidence of pleural effusion or pneumothorax. The heart is normal in size; the mediastinal contour is within normal limits. No acute osseous abnormalities are seen. Clips are noted within the right upper quadrant, reflecting prior cholecystectomy. IMPRESSION: Vascular congestion noted. Increased interstitial markings raise concern for mild pulmonary edema. Electronically Signed   By: Garald Balding M.D.   On: 05/16/2016 04:00    Micro Results     No results found for this or any previous visit (from the past 240 hour(s)).  Today   Subjective    Wallace today has no headache,no chest abdominal pain,no new weakness tingling or numbness, feels much better wants to go home today.     Objective   Blood pressure 106/75, pulse 100, temperature 97.9 F (36.6 C), temperature source Oral, resp. rate 20, height 5\' 5"  (1.651 m), weight 50.2 kg (110 lb 10.7 oz), SpO2 100 %.   Intake/Output Summary (Last 24 hours) at 05/18/16 1036 Last data filed at 05/18/16 0207  Gross per 24 hour  Intake              240 ml  Output             2350 ml  Net            -2110 ml    Exam Awake Alert, Oriented x 3, No new F.N deficits, Normal affect [place].AT,PERRAL Supple Neck,No JVD, No cervical lymphadenopathy appriciated.  Symmetrical Chest wall movement, Good air movement bilaterally, CTAB RRR,No Gallops,Rubs  or new Murmurs, No Parasternal Heave +ve B.Sounds, Abd Soft, Non tender, No organomegaly  appriciated, No rebound -guarding or rigidity. No Cyanosis, Clubbing or edema, No new Rash or bruise   Data Review   CBC w Diff: Lab Results  Component Value Date   WBC 8.0 05/17/2016   HGB 11.5 (L) 05/17/2016   HCT 34.0 (L) 05/17/2016   PLT 385 05/17/2016   LYMPHOPCT 29 05/13/2016   MONOPCT 8 05/13/2016   EOSPCT 1 05/13/2016   BASOPCT 0 05/13/2016    CMP: Lab Results  Component Value Date   NA 135 05/18/2016   K 3.9 05/18/2016   CL 98 (L) 05/18/2016   CO2 29 05/18/2016   BUN 20 05/18/2016   CREATININE 1.06 (H) 05/18/2016   CREATININE 0.73 05/13/2016   PROT 7.0 03/24/2016   ALBUMIN 3.3 (L) 03/24/2016   BILITOT 0.4 03/24/2016   ALKPHOS 71 03/24/2016   AST 23 03/24/2016   ALT 18 03/24/2016  .   Total Time in preparing paper work, data evaluation and todays exam - 35 minutes  Thurnell Lose M.D on 05/18/2016 at 10:36 AM  [facility]   Office  [phone]

## 2016-05-19 ENCOUNTER — Telehealth: Payer: Self-pay | Admitting: Adult Health

## 2016-05-19 ENCOUNTER — Telehealth: Payer: Self-pay | Admitting: *Deleted

## 2016-05-19 NOTE — Telephone Encounter (Signed)
Attempted to reach,LMTCB--

## 2016-05-19 NOTE — Telephone Encounter (Signed)
Please call patient regarding BP readings / tg

## 2016-05-19 NOTE — Progress Notes (Signed)
Cardiac Individual Treatment Plan  Patient Details  Name: CHAYNA PICK MRN: [id number] Date of Birth: [birth year] Referring Provider:   Flowsheet Row CARDIAC REHAB PHASE II ORIENTATION from 04/19/2016 in [place]  Referring Provider  Dr. Radford Pax      Initial Encounter Date:  Flowsheet Row CARDIAC REHAB PHASE II ORIENTATION from 04/19/2016 in [place]  Date  04/19/16  Referring Provider  Dr. Radford Pax      Visit Diagnosis: NSTEMI (non-ST elevated myocardial infarction) [facility])  Patient's Home Medications on Admission:  Current Outpatient Prescriptions:  .  acetaminophen (TYLENOL) 500 MG tablet, Take 1 tablet (500 mg total) by mouth every 8 (eight) hours as needed for mild pain, moderate pain or headache., Disp: 30 tablet, Rfl: 0 .  aspirin EC 81 MG tablet, Take 81 mg by mouth daily., Disp: , Rfl:  .  carvedilol (COREG) 3.125 MG tablet, Take 1 tablet (3.125 mg total) by mouth 2 (two) times daily with a meal., Disp: 60 tablet, Rfl: 3 .  [START ON 05/20/2016] furosemide (LASIX) 20 MG tablet, Take 1 tablet (20 mg total) by mouth daily., Disp: 30 tablet, Rfl: 0 .  LORazepam (ATIVAN) 0.5 MG tablet, Take 1 tablet (0.5 mg total) by mouth every 12 (twelve) hours as needed for anxiety. DO NOT DRIVE WHEN TAKING THIS MEDICATION. (Patient taking differently: Take 1 mg by mouth at bedtime as needed for anxiety or sleep. DO NOT DRIVE WHEN TAKING THIS MEDICATION.), Disp: 20 tablet, Rfl: 0 .  [START ON 05/20/2016] losartan (COZAAR) 25 MG tablet, Take 0.5 tablets (12.5 mg total) by mouth every evening., Disp: 15 tablet, Rfl: 0 .  nitroGLYCERIN (NITROSTAT) 0.4 MG SL tablet, Place 1 tablet (0.4 mg total) under the tongue every 5 (five) minutes x 3 doses as needed for chest pain., Disp: 25 tablet, Rfl: 3 .  ticagrelor (BRILINTA) 90 MG TABS tablet, Take 90 mg by mouth 2 (two) times daily., Disp: , Rfl:   Past Medical History: Past Medical History:  Diagnosis  Date  . Anxiety state 03/25/2016  . Basal cell carcinoma of forehead   . Coronary artery disease    a. 03/11/16 PCI with DES-->Prox/Mid Cx, 03/14/16 PCI with DES x2-->RCA, EF 30-35%  . Essential hypertension   . GERD (gastroesophageal reflux disease)   . History of pneumonia   . Hyperlipidemia   . Ischemic cardiomyopathy   . Mitral regurgitation   . NSTEMI (non-ST elevated myocardial infarction) ([place]) 03/10/2016  . Squamous cell cancer of skin of nose   . Thrombocytosis ([place]) 03/26/2016  . Wears dentures   . Wears glasses     Tobacco Use: History  Smoking Status  . Former Smoker  . Packs/day: 1.00  . Years: 15.00  . Types: Cigarettes  Smokeless Tobacco  . Never Used    Comment: quit 2 weeks ago-03/24/16    Labs: Recent Review Flowsheet Data    Labs for ITP Cardiac and Pulmonary Rehab Latest Ref Rng & Units 09/06/2015 01/15/2016 03/11/2016 05/16/2016 05/17/2016   Cholestrol 0 - 200 mg/dL 229(H) 273(H) - - 107   LDLCALC 0 - 99 mg/dL 130(H) 146(H) - - 32   HDL >40 mg/dL 34(L) 53 - - 41   Trlycerides <150 mg/dL 324(H) 371(H) - - 168(H)   Hemoglobin A1c 4.8 - 5.6 % 5.7(H) - - 5.4 -   TCO2 0 - 100 mmol/L - - 22 - -      Capillary Blood Glucose: No results found for: GLUCAP  Exercise Target Goals:    Exercise Program Goal: Individual exercise prescription set with THRR, safety & activity barriers. Participant demonstrates ability to understand and report RPE using BORG scale, to self-measure pulse accurately, and to acknowledge the importance of the exercise prescription.  Exercise Prescription Goal: Starting with aerobic activity 30 plus minutes a day, 3 days per week for initial exercise prescription. Provide home exercise prescription and guidelines that participant acknowledges understanding prior to discharge.  Activity Barriers & Risk Stratification:     Activity Barriers & Cardiac Risk Stratification - 04/19/16 1015      Activity Barriers & Cardiac Risk  Stratification   Activity Barriers None   Cardiac Risk Stratification High      6 Minute Walk:     6 Minute Walk    Row Name 04/19/16 1011         6 Minute Walk   Phase Initial     Distance 1250 feet     Distance % Change 0 %     Walk Time 6 minutes     # of Rest Breaks 0     MPH 2.36     METS 2.81     RPE 9     Perceived Dyspnea  13     VO2 Peak 13.15     Symptoms No     Resting HR 92 bpm     Resting BP 106/74     Max Ex. HR 108 bpm     Max Ex. BP 120/74     2 Minute Post BP 106/72        Initial Exercise Prescription:     Initial Exercise Prescription - 04/19/16 1000      Date of Initial Exercise RX and Referring Provider   Date 04/19/16   Referring Provider Dr. Radford Pax     Treadmill   MPH 1.3   Grade 0   Minutes 20   METs 1.9     NuStep   Level 2   Watts 10   Minutes 15   METs 1.6     Prescription Details   Frequency (times per week) 3   Duration Progress to 30 minutes of continuous aerobic without signs/symptoms of physical distress     Intensity   THRR REST +  30   THRR 40-80% of Max Heartrate [phone]   Ratings of Perceived Exertion 11-13   Perceived Dyspnea 0-4     Progression   Progression Continue progressive overload as per policy without signs/symptoms or physical distress.     Resistance Training   Training Prescription Yes   Weight 1   Reps 10-12      Perform Capillary Blood Glucose checks as needed.  Exercise Prescription Changes:      Exercise Prescription Changes    Row Name 05/16/16 1400             Exercise Review   Progression Yes         Response to Exercise   Blood Pressure (Admit) 126/72       Blood Pressure (Exercise) 150/84       Blood Pressure (Exit) 132/68       Heart Rate (Admit) 87 bpm       Heart Rate (Exercise) 120 bpm       Heart Rate (Exit) 91 bpm       Rating of Perceived Exertion (Exercise) 13       Duration Progress to 30 minutes of continuous aerobic without signs/symptoms of  physical distress       Intensity Rest + 30         Progression   Progression Continue progressive overload as per policy without signs/symptoms or physical distress.         Resistance Training   Training Prescription Yes       Weight 1       Reps 10-12         Treadmill   MPH 1.5       Grade 0       Minutes 20       METs 2.1         NuStep   Level 2       Watts 15       Minutes 15       METs 3.23         Home Exercise Plan   Plans to continue exercise at Home       Frequency Add 2 additional days to program exercise sessions.          Exercise Comments:      Exercise Comments    Row Name 05/16/16 1420           Exercise Comments Patient is progressing well.           Discharge Exercise Prescription (Final Exercise Prescription Changes):     Exercise Prescription Changes - 05/16/16 1400      Exercise Review   Progression Yes     Response to Exercise   Blood Pressure (Admit) 126/72   Blood Pressure (Exercise) 150/84   Blood Pressure (Exit) 132/68   Heart Rate (Admit) 87 bpm   Heart Rate (Exercise) 120 bpm   Heart Rate (Exit) 91 bpm   Rating of Perceived Exertion (Exercise) 13   Duration Progress to 30 minutes of continuous aerobic without signs/symptoms of physical distress   Intensity Rest + 30     Progression   Progression Continue progressive overload as per policy without signs/symptoms or physical distress.     Resistance Training   Training Prescription Yes   Weight 1   Reps 10-12     Treadmill   MPH 1.5   Grade 0   Minutes 20   METs 2.1     NuStep   Level 2   Watts 15   Minutes 15   METs 3.23     Home Exercise Plan   Plans to continue exercise at Home   Frequency Add 2 additional days to program exercise sessions.      Nutrition:  Target Goals: Understanding of nutrition guidelines, daily intake of sodium 1500mg , cholesterol 200mg , calories 30% from fat and 7% or less from saturated fats, daily to have 5 or more  servings of fruits and vegetables.  Biometrics:     Pre Biometrics - 04/19/16 1014      Pre Biometrics   Height 5' (1.524 m)   Weight 116 lb 2.9 oz (52.7 kg)   Waist Circumference 30 inches   Hip Circumference 34.5 inches   Waist to Hip Ratio 0.87 %   BMI (Calculated) 22.7   Triceps Skinfold 14 mm   % Body Fat 30.5 %   Grip Strength 44.3 kg   Flexibility 15.3 in   Single Leg Stand 14 seconds       Nutrition Therapy Plan and Nutrition Goals:   Nutrition Discharge: Rate Your Plate Scores:     Nutrition Assessments - 04/19/16 1021  MEDFICTS Scores   Pre Score 49      Nutrition Goals Re-Evaluation:   Psychosocial: Target Goals: Acknowledge presence or absence of depression, maximize coping skills, provide positive support system. Participant is able to verbalize types and ability to use techniques and skills needed for reducing stress and depression.  Initial Review & Psychosocial Screening:     Initial Psych Review & Screening - 04/19/16 1025      Initial Review   Current issues with Current Depression;Current Anxiety/Panic     Family Dynamics   Good Support System? Yes     Barriers   Psychosocial barriers to participate in program There are no identifiable barriers or psychosocial needs.  Patient is anxious about having another heart event, especially before bedtime. Concerned about her finances.      Screening Interventions   Interventions Encouraged to exercise      Quality of Life Scores:     Quality of Life - 04/19/16 1015      Quality of Life Scores   Health/Function Pre 10.87 %   Socioeconomic Pre 22 %   Psych/Spiritual Pre 16.29 %   Family Pre 20.4 %   GLOBAL Pre 15.68 %      PHQ-9: Recent Review Flowsheet Data    Depression screen [facility] 2/9 04/19/2016   Decreased Interest 2   Down, Depressed, Hopeless 3   PHQ - 2 Score 5   Altered sleeping 3   Tired, decreased energy 3   Change in appetite 3   Feeling bad or failure about  yourself  3   Trouble concentrating 3   Moving slowly or fidgety/restless 0   Suicidal thoughts 0   PHQ-9 Score 20   Difficult doing work/chores Somewhat difficult      Psychosocial Evaluation and Intervention:     Psychosocial Evaluation - 04/19/16 1026      Psychosocial Evaluation & Interventions   Interventions Relaxation education;Stress management education   Continued Psychosocial Services Needed Yes      Psychosocial Re-Evaluation:     Psychosocial Re-Evaluation    Row Name 05/19/16 0808             Psychosocial Re-Evaluation   Interventions Encouraged to attend Cardiac Rehabilitation for the exercise       Comments Patient has depression/anxiety. Her QOL was 15.68 and her PHQ-9 was 20. She continues to say she does not need therapy.        Continued Psychosocial Services Needed Yes          Vocational Rehabilitation: Provide vocational rehab assistance to qualifying candidates.   Vocational Rehab Evaluation & Intervention:     Vocational Rehab - 04/19/16 1019      Initial Vocational Rehab Evaluation & Intervention   Assessment shows need for Vocational Rehabilitation No      Education: Education Goals: Education classes will be provided on a weekly basis, covering required topics. Participant will state understanding/return demonstration of topics presented.  Learning Barriers/Preferences:     Learning Barriers/Preferences - 04/19/16 1015      Learning Barriers/Preferences   Learning Barriers None   Learning Preferences Video;Written Material;Pictoral;Computer/Internet      Education Topics: Hypertension, Hypertension Reduction -Define heart disease and high blood pressure. Discus how high blood pressure affects the body and ways to reduce high blood pressure.   Exercise and Your Heart -Discuss why it is important to exercise, the FITT principles of exercise, normal and abnormal responses to exercise, and how to exercise  safely.  Angina -Discuss definition of angina, causes of angina, treatment of angina, and how to decrease risk of having angina.   Cardiac Medications -Review what the following cardiac medications are used for, how they affect the body, and side effects that may occur when taking the medications.  Medications include Aspirin, Beta blockers, calcium channel blockers, ACE Inhibitors, angiotensin receptor blockers, diuretics, digoxin, and antihyperlipidemics.   Congestive Heart Failure -Discuss the definition of CHF, how to live with CHF, the signs and symptoms of CHF, and how keep track of weight and sodium intake.   Heart Disease and Intimacy -Discus the effect sexual activity has on the heart, how changes occur during intimacy as we age, and safety during sexual activity.   Smoking Cessation / COPD -Discuss different methods to quit smoking, the health benefits of quitting smoking, and the definition of COPD.   Nutrition I: Fats -Discuss the types of cholesterol, what cholesterol does to the heart, and how cholesterol levels can be controlled. Flowsheet Row CARDIAC REHAB PHASE II EXERCISE from 05/04/2016 in [place]  Date  04/27/16  Educator  Russella Dar  Instruction Review Code  2- meets goals/outcomes      Nutrition II: Labels -Discuss the different components of food labels and how to read food label [place] from 05/04/2016 in [place]  Date  05/04/16  Educator  Russella Dar  Instruction Review Code  2- meets goals/outcomes      Heart Parts and Heart Disease -Discuss the anatomy of the heart, the pathway of blood circulation through the heart, and these are affected by heart disease.   Stress I: Signs and Symptoms -Discuss the causes of stress, how stress may lead to anxiety and depression, and ways to limit stress.   Stress II: Relaxation -Discuss different types of relaxation  techniques to limit stress.   Warning Signs of Stroke / TIA -Discuss definition of a stroke, what the signs and symptoms are of a stroke, and how to identify when someone is having stroke.   Knowledge Questionnaire Score:     Knowledge Questionnaire Score - 04/19/16 1019      Knowledge Questionnaire Score   Pre Score 22/28      Core Components/Risk Factors/Patient Goals at Admission:     Personal Goals and Risk Factors at Admission - 04/19/16 1021      Core Components/Risk Factors/Patient Goals on Admission    Weight Management Weight Maintenance   Increase Strength and Stamina Yes   Intervention Provide advice, education, support and counseling about physical activity/exercise needs.;Develop an individualized exercise prescription for aerobic and resistive training based on initial evaluation findings, risk stratification, comorbidities and participant's personal goals.   Expected Outcomes Achievement of increased cardiorespiratory fitness and enhanced flexibility, muscular endurance and strength shown through measurements of functional capacity and personal statement of participant.   Tobacco Cessation Yes   Intervention Assist the participant in steps to quit. Provide individualized education and counseling about committing to Tobacco Cessation, relapse prevention, and pharmacological support that can be provided by physician.;Advice worker, assist with locating and accessing local/national Quit Smoking programs, and support quit date choice.   Expected Outcomes Long Term: Complete abstinence from all tobacco products for at least 12 months from quit date.;Short Term: Will demonstrate readiness to quit, by selecting a quit date.   Stress Yes   Intervention Offer individual and/or small group education and counseling on adjustment to heart disease, stress management and health-related  lifestyle change. Teach and support self-help strategies.;Refer participants  experiencing significant psychosocial distress to appropriate mental health specialists for further evaluation and treatment. When possible, include family members and significant others in education/counseling sessions.   Expected Outcomes Short Term: Participant demonstrates changes in health-related behavior, relaxation and other stress management skills, ability to obtain effective social support, and compliance with psychotropic medications if prescribed.;Long Term: Emotional wellbeing is indicated by absence of clinically significant psychosocial distress or social isolation.   Personal Goal Gain Strength, be where I was before event.    Intervention Attend CR 3 x week and supplement 2 x week at home.    Expected Outcomes Reach personal goals.       Core Components/Risk Factors/Patient Goals Review:      Goals and Risk Factor Review    Row Name 04/19/16 1024 05/19/16 0757           Core Components/Risk Factors/Patient Goals Review   Personal Goals Review Increase Strength and Stamina;Stress;Tobacco Cessation Weight Management/Obesity;Increase Strength and Stamina;Tobacco Cessation  Gain Strength, be where I was before event.       Review  - Patient has attended 5 sessions. She is currently hospitalized d/t acute CHF. Will continue to monitor for progress.       Expected Outcomes  - Patient will be able to complete the program meeting her personal goals.          Core Components/Risk Factors/Patient Goals at Discharge (Final Review):      Goals and Risk Factor Review - 05/19/16 0757      Core Components/Risk Factors/Patient Goals Review   Personal Goals Review Weight Management/Obesity;Increase Strength and Stamina;Tobacco Cessation  Gain Strength, be where I was before event.    Review Patient has attended 5 sessions. She is currently hospitalized d/t acute CHF. Will continue to monitor for progress.    Expected Outcomes Patient will be able to complete the program meeting her  personal goals.       ITP Comments:     ITP Comments    Row Name 04/19/16 1104 04/20/16 1322         ITP Comments Virigina Pruyn is still smoking just 1/2 cigarette/day. Will give her information about our somoking cessation program.  Patient new to program. Will start 04/25/16.         Comments: ITP 30 Day REVIEW Patient doing well with program. Will continue to monitor for progress.

## 2016-05-19 NOTE — Telephone Encounter (Addendum)
Telephoned patient to follow up from recent hospitalizations and her plan of care. I asked the patient if she was still on Research provided ASA & Brilinta, she states she was and that she was switched to plavix during last admission received 2 doses and instructed to restart Brilinta at home. She has had 3 admissions since her enrollment in TWILIGHT research study on 03/15/16. She would like to continue with the research study if this is still feasible with her physicians. She has an appointment next week and will touch base with them to see if she can continue in TWILIGHT research study. Instructed patient that she will be randomized to ASA 81 mg or PLACEBO (before 18/FEB/2018) if she wishes to remain in study and that I will send epic note to K.Lawerence, NP. Questions encouraged and answered.

## 2016-05-19 NOTE — Telephone Encounter (Signed)
Will multiple admissions and dietary non-compliance issues, I would not have her on TWILIGHT study. She has had issues with dyspnea. Brilinta needs to be out of the mix to avoid clouding the issue, as this has side effects of dyspnea in some patents.

## 2016-05-20 ENCOUNTER — Encounter (HOSPITAL_COMMUNITY): Payer: PRIVATE HEALTH INSURANCE

## 2016-05-20 ENCOUNTER — Ambulatory Visit: Payer: Self-pay | Admitting: Adult Health

## 2016-05-23 ENCOUNTER — Encounter (HOSPITAL_COMMUNITY)
Admission: RE | Admit: 2016-05-23 | Discharge: 2016-05-23 | Disposition: A | Discharge status: Home / Self Care | Payer: PRIVATE HEALTH INSURANCE | Source: Ambulatory Visit | Attending: Cardiology | Admitting: Cardiology

## 2016-05-23 DIAGNOSIS — I214 Non-ST elevation (NSTEMI) myocardial infarction: Secondary | ICD-10-CM | POA: Diagnosis not present

## 2016-05-23 DIAGNOSIS — Z955 Presence of coronary angioplasty implant and graft: Secondary | ICD-10-CM | POA: Diagnosis present

## 2016-05-23 NOTE — Progress Notes (Signed)
Daily Session Note  Patient Details  Name: Erin [name] MRN: [id number] Date of Birth: 01-06-1961 Referring Provider:   Flowsheet Row CARDIAC REHAB PHASE II ORIENTATION from 04/19/2016 in [place]  Referring Provider  Dr. Radford Pax      Encounter Date: 05/23/2016  Check In:     Session Check In - 05/23/16 1545      Check-In   Location [facility]   Staff Present Diane Angelina Pih, MS, EP, [facility], Exercise Physiologist;Yoshua [facility] Wynetta Emery, RN, BSN   Supervising physician immediately available to respond to emergencies See telemetry face sheet for immediately available MD   Medication changes reported     No   Fall or balance concerns reported    No   Warm-up and Cool-down Performed as group-led instruction   Resistance Training Performed Yes   VAD Patient? No     Pain Assessment   Currently in Pain? No/denies   Pain Score 0-No pain   Multiple Pain Sites No      Capillary Blood Glucose: No results found for this or any previous visit (from the past 24 hour(s)).   Goals Met:  Independence with exercise equipment Exercise tolerated well No report of cardiac concerns or symptoms Strength training completed today  Goals Unmet:  Not Applicable  Comments: Check out 1645.   Dr. Kate Sable is Medical Director for [facility] Cardiac and Pulmonary Rehab.

## 2016-05-25 ENCOUNTER — Encounter (HOSPITAL_COMMUNITY): Payer: PRIVATE HEALTH INSURANCE

## 2016-05-26 ENCOUNTER — Encounter: Payer: PRIVATE HEALTH INSURANCE | Admitting: Adult Health

## 2016-05-27 ENCOUNTER — Encounter (HOSPITAL_COMMUNITY): Payer: PRIVATE HEALTH INSURANCE

## 2016-05-30 ENCOUNTER — Encounter (HOSPITAL_COMMUNITY): Admission: RE | Admit: 2016-05-30 | Payer: PRIVATE HEALTH INSURANCE | Source: Ambulatory Visit

## 2016-05-30 NOTE — Telephone Encounter (Signed)
Called pt. Unable to leave message. Will mail pt. Letter requesting pt call office with bp readings.

## 2016-05-31 ENCOUNTER — Encounter: Payer: Self-pay | Admitting: *Deleted

## 2016-05-31 DIAGNOSIS — Z006 Encounter for examination for normal comparison and control in clinical research program: Secondary | ICD-10-CM

## 2016-05-31 NOTE — Progress Notes (Signed)
Called patient about the TWILIGHT research study. Patient continues to experience shortness of breath. I explained to patient the Brilinta can cause some of that shob. I reviewed the information obtained in EPIC about continuing in the Research study and that K.Lawerence, NP thinks it would be best not to continue with her symptoms. I instructed the patient to continue the Brilinta ans ASA as prescribed until she is instructed differently by Ms.Lawerence. Questions encouraged and answered. Patient verbalizes understanding.

## 2016-06-01 ENCOUNTER — Encounter (HOSPITAL_COMMUNITY): Payer: PRIVATE HEALTH INSURANCE

## 2016-06-02 ENCOUNTER — Encounter: Payer: Self-pay | Admitting: *Deleted

## 2016-06-02 ENCOUNTER — Ambulatory Visit (INDEPENDENT_AMBULATORY_CARE_PROVIDER_SITE_OTHER): Payer: PRIVATE HEALTH INSURANCE | Admitting: Adult Health

## 2016-06-02 ENCOUNTER — Encounter: Payer: Self-pay | Admitting: Adult Health

## 2016-06-02 VITALS — BP 108/70 | HR 82 | Ht 60.0 in | Wt 112.0 lb

## 2016-06-02 DIAGNOSIS — I5022 Chronic systolic (congestive) heart failure: Secondary | ICD-10-CM

## 2016-06-02 DIAGNOSIS — I2510 Atherosclerotic heart disease of native coronary artery without angina pectoris: Secondary | ICD-10-CM

## 2016-06-02 DIAGNOSIS — R29898 Other symptoms and signs involving the musculoskeletal system: Secondary | ICD-10-CM | POA: Diagnosis not present

## 2016-06-02 MED ORDER — CLOPIDOGREL BISULFATE 75 MG PO TABS: 75.0000 mg | ORAL_TABLET | Freq: Every day | ORAL | 3 refills | Status: DC

## 2016-06-02 NOTE — Addendum Note (Signed)
Addended by: Levonne Hubert on: 06/02/2016 02:38 PM   Modules accepted: Orders

## 2016-06-02 NOTE — Progress Notes (Signed)
Name: Erin [name]    DOB: 04/14/1961  Age: 56 y.o.  MR#: [id number]       PCP:  Jonathon Bellows, MD      Insurance: Payor: MEDCOST / Plan: MEDCOST / Product Type: *No Product type* /   CC:   No chief complaint on file.   VS Vitals:   06/02/16 1344  BP: 108/70  Pulse: 82  SpO2: 99%  Weight: 112 lb (50.8 kg)  Height: 5' (1.524 m)    Weights Current Weight  06/02/16 112 lb (50.8 kg)  05/18/16 110 lb 10.7 oz (50.2 kg)  05/13/16 117 lb (53.1 kg)    Blood Pressure  BP Readings from Last 3 Encounters:  06/02/16 108/70  05/18/16 106/75  05/13/16 (!) 150/100     Admit date:  (Not on file) Last encounter with RMR:  05/19/2016   Allergy Patient has no known allergies.  Current Outpatient Prescriptions  Medication Sig Dispense Refill  . acetaminophen (TYLENOL) 500 MG tablet Take 1 tablet (500 mg total) by mouth every 8 (eight) hours as needed for mild pain, moderate pain or headache. 30 tablet 0  . aspirin EC 81 MG tablet Take 81 mg by mouth daily.    . carvedilol (COREG) 3.125 MG tablet Take 1 tablet (3.125 mg total) by mouth 2 (two) times daily with a meal. 60 tablet 3  . furosemide (LASIX) 20 MG tablet Take 1 tablet (20 mg total) by mouth daily. 30 tablet 0  . LORazepam (ATIVAN) 0.5 MG tablet Take 1 tablet (0.5 mg total) by mouth every 12 (twelve) hours as needed for anxiety. DO NOT DRIVE WHEN TAKING THIS MEDICATION. (Patient taking differently: Take 1 mg by mouth at bedtime as needed for anxiety or sleep. DO NOT DRIVE WHEN TAKING THIS MEDICATION.) 20 tablet 0  . losartan (COZAAR) 25 MG tablet Take 0.5 tablets (12.5 mg total) by mouth every evening. 15 tablet 0  . nitroGLYCERIN (NITROSTAT) 0.4 MG SL tablet Place 1 tablet (0.4 mg total) under the tongue every 5 (five) minutes x 3 doses as needed for chest pain. 25 tablet 3  . ticagrelor (BRILINTA) 90 MG TABS tablet Take 90 mg by mouth 2 (two) times daily.     No current facility-administered medications for this visit.      Discontinued Meds:   There are no discontinued medications.  Patient Active Problem List   Diagnosis Date Noted  . Hypokalemia   . Acute CHF (congestive heart failure) ([place]) 05/16/2016  . Acute on chronic systolic CHF (congestive heart failure) ([place]) 05/16/2016  . Acute respiratory failure with hypoxia ([place])   . Thrombocytosis ([place]) 03/26/2016  . Cardiomyopathy, ischemic 03/25/2016  . Acute on chronic combined systolic and diastolic congestive heart failure ([place]) 03/25/2016  . Anxiety state 03/25/2016  . Troponin level elevated 03/25/2016  . CAD (coronary artery disease) 03/25/2016  . Normocytic anemia 03/25/2016  . SOB (shortness of breath) 03/24/2016  . Lightheadedness 03/17/2016  . Hypotension 03/17/2016  . Tobacco abuse 03/12/2016  . NSTEMI (non-ST elevated myocardial infarction) ([place]) 03/11/2016  . Pain in the chest   . Essential hypertension 09/06/2015  . Hyperlipidemia 09/06/2015  . GERD (gastroesophageal reflux disease) 09/06/2015  . Chest pain 09/06/2015    LABS    Component Value Date/Time   NA 135 05/18/2016 0508   NA 137 05/17/2016 0536   NA 141 05/16/2016 0317   K 3.9 05/18/2016 0508   K 3.2 (L) 05/17/2016 0536   K 3.6 05/16/2016 0317  CL 98 (L) 05/18/2016 0508   CL 104 05/17/2016 0536   CL 112 (H) 05/16/2016 0317   CO2 29 05/18/2016 0508   CO2 26 05/17/2016 0536   CO2 23 05/16/2016 0317   GLUCOSE 132 (H) 05/18/2016 0508   GLUCOSE 97 05/17/2016 0536   GLUCOSE 101 (H) 05/16/2016 0317   BUN 20 05/18/2016 0508   BUN 17 05/17/2016 0536   BUN 12 05/16/2016 0317   CREATININE 1.06 (H) 05/18/2016 0508   CREATININE 0.79 05/17/2016 0536   CREATININE 0.74 05/16/2016 1304   CREATININE 0.73 05/13/2016 1020   CALCIUM 9.7 05/18/2016 0508   CALCIUM 9.0 05/17/2016 0536   CALCIUM 9.2 05/16/2016 0317   GFRNONAA 58 (L) 05/18/2016 0508   GFRNONAA >60 05/17/2016 0536   GFRNONAA >60 05/16/2016 1304   GFRAA >60 05/18/2016 0508   GFRAA >60 05/17/2016 0536    GFRAA >60 05/16/2016 1304   CMP     Component Value Date/Time   NA 135 05/18/2016 0508   K 3.9 05/18/2016 0508   CL 98 (L) 05/18/2016 0508   CO2 29 05/18/2016 0508   GLUCOSE 132 (H) 05/18/2016 0508   BUN 20 05/18/2016 0508   CREATININE 1.06 (H) 05/18/2016 0508   CREATININE 0.73 05/13/2016 1020   CALCIUM 9.7 05/18/2016 0508   PROT 7.0 03/24/2016 1227   ALBUMIN 3.3 (L) 03/24/2016 1227   AST 23 03/24/2016 1227   ALT 18 03/24/2016 1227   ALKPHOS 71 03/24/2016 1227   BILITOT 0.4 03/24/2016 1227   GFRNONAA 58 (L) 05/18/2016 0508   GFRAA >60 05/18/2016 0508       Component Value Date/Time   WBC 8.0 05/17/2016 0536   WBC 9.5 05/16/2016 1304   WBC 7.9 05/16/2016 0317   HGB 11.5 (L) 05/17/2016 0536   HGB 11.5 (L) 05/16/2016 1304   HGB 11.1 (L) 05/16/2016 0317   HCT 34.0 (L) 05/17/2016 0536   HCT 35.4 (L) 05/16/2016 1304   HCT 33.3 (L) 05/16/2016 0317   MCV 83.5 05/17/2016 0536   MCV 85.1 05/16/2016 1304   MCV 85.2 05/16/2016 0317    Lipid Panel     Component Value Date/Time   CHOL 107 05/17/2016 0536   TRIG 168 (H) 05/17/2016 0536   HDL 41 05/17/2016 0536   CHOLHDL 2.6 05/17/2016 0536   VLDL 34 05/17/2016 0536   LDLCALC 32 05/17/2016 0536    ABG    Component Value Date/Time   TCO2 22 03/11/2016 1649     Lab Results  Component Value Date   TSH 0.937 05/16/2016   BNP (last 3 results)  Recent Labs  03/17/16 1822 05/16/16 0317  BNP 221.0* 783.0*    ProBNP (last 3 results) No results for input(s): PROBNP in the last 8760 hours.  Cardiac Panel (last 3 results) No results for input(s): CKTOTAL, CKMB, TROPONINI, RELINDX in the last 72 hours.  Iron/TIBC/Ferritin/ %Sat    Component Value Date/Time   IRON 10 (L) 03/18/2016 0940   TIBC 252 03/18/2016 0940   FERRITIN 356 (H) 03/18/2016 0940   IRONPCTSAT 4 (L) 03/18/2016 0940     EKG Orders placed or performed during the hospital encounter of 05/16/16  . ED EKG within 10 minutes  . ED EKG within 10  minutes  . EKG 12-Lead  . EKG 12-Lead  . Repeat EKG  . Repeat EKG  . EKG 12-Lead  . EKG 12-Lead  . EKG     Prior Assessment and Plan Problem List as of 06/02/2016 Reviewed:  05/16/2016  8:10 AM by Rosita Fire, MD     Cardiovascular and Mediastinum   Essential hypertension   NSTEMI (non-ST elevated myocardial infarction) ([place])   Hypotension   Cardiomyopathy, ischemic   Acute on chronic combined systolic and diastolic congestive heart failure (HCC)   CAD (coronary artery disease)   Acute CHF (congestive heart failure) (HCC)   Acute on chronic systolic CHF (congestive heart failure) (HCC)     Respiratory   Acute respiratory failure with hypoxia (HCC)     Digestive   GERD (gastroesophageal reflux disease)     Hematopoietic and Hemostatic   Thrombocytosis (HCC)     Other   Hyperlipidemia   Chest pain   Pain in the chest   Tobacco abuse   Lightheadedness   SOB (shortness of breath)   Anxiety state   Troponin level elevated   Normocytic anemia   Hypokalemia       Imaging: Dg Chest 2 View  Result Date: 05/16/2016 CLINICAL DATA:  Acute onset of mid chest tightness, shortness of breath and dizziness. Initial encounter. EXAM: CHEST  2 VIEW COMPARISON:  Chest radiograph performed 03/24/2016 FINDINGS: The lungs are well-aerated. Vascular congestion is noted. Increased interstitial markings raise concern for mild pulmonary edema. There is no evidence of pleural effusion or pneumothorax. The heart is normal in size; the mediastinal contour is within normal limits. No acute osseous abnormalities are seen. Clips are noted within the right upper quadrant, reflecting prior cholecystectomy. IMPRESSION: Vascular congestion noted. Increased interstitial markings raise concern for mild pulmonary edema. Electronically Signed   By: Garald Balding M.D.   On: 05/16/2016 04:00

## 2016-06-02 NOTE — Patient Instructions (Addendum)
Your physician recommends that you schedule a follow-up appointment in: 1 Month with Dr. Bronson Ing  Your physician has recommended you make the following change in your medication:  Stop Taking Brilinta  Start Taking Plavix   Have MRI of lumbar done.   If you need a refill on your cardiac medications before your next appointment, please call your pharmacy.  Thank you for choosing [facility]!

## 2016-06-02 NOTE — Progress Notes (Signed)
Cardiology Office Note   Date:  06/02/2016   ID:  Erin, [name] 1961-01-22, MRN [id number]  PCP:  Jonathon Bellows, MD  Cardiologist: Woodroe Chen, NP   No chief complaint on file.     History of Present Illness: Erin [name] is a 56 y.o. female who presents for for ongoing assessment and management of chronic dyspnea, coronary artery disease with drug-eluting stent to the right coronary artery and PDA 2, ischemic cardiomyopathy with most recent ejection fraction between 30 and 35%. Other history includes anxiety, chronic chest tightness, and GERD. The patient was seen on consultation during recent hospitalization on 05/16/2016 for recurrent chest discomfort. She had acute hypoxic respiratory failure due to acute on chronic systolic heart failure. She was given IV diuresis, placed on Entresto, but this was not continued on discharge instead she was placed on losartan. She was sent home on by mouth Lasix 20 mg daily, continued on carvedilol and Brilinta. She will need follow-up BMET.  She has a new complaint today. She loses feeling in her left leg when she gets out of bed or out of the bathtub. She states that she cannot feel it at all and often falls. She has not reported this to her PCP. She also has continued issues with breathing after taking Brilinta. She has stopped smoking.  She denies fluid retention or PND.   Past Medical History:  Diagnosis Date  . Anxiety state 03/25/2016  . Basal cell carcinoma of forehead   . Coronary artery disease    a. 03/11/16 PCI with DES-->Prox/Mid Cx, 03/14/16 PCI with DES x2-->RCA, EF 30-35%  . Essential hypertension   . GERD (gastroesophageal reflux disease)   . History of pneumonia   . Hyperlipidemia   . Ischemic cardiomyopathy   . Mitral regurgitation   . NSTEMI (non-ST elevated myocardial infarction) ([place]) 03/10/2016  . Squamous cell cancer of skin of nose   . Thrombocytosis ([place]) 03/26/2016  . Wears dentures   . Wears  glasses     Past Surgical History:  Procedure Laterality Date  . APPENDECTOMY    . CARDIAC CATHETERIZATION N/A 03/11/2016   Procedure: Left Heart Cath and Coronary Angiography;  Surgeon: Leonie Man, MD;  Location: [facility];  Service: Cardiovascular;  Laterality: N/A;  . CARDIAC CATHETERIZATION N/A 03/11/2016   Procedure: Coronary Stent Intervention;  Surgeon: Leonie Man, MD;  Location: [facility];  Service: Cardiovascular;  Laterality: N/A;  . CARDIAC CATHETERIZATION N/A 03/14/2016   Procedure: Coronary Stent Intervention;  Surgeon: Peter M Martinique, MD;  Location: [facility];  Service: Cardiovascular;  Laterality: N/A;  . CHOLECYSTECTOMY OPEN  1984  . CORONARY ANGIOPLASTY WITH STENT PLACEMENT  03/14/2016  . FINGER ARTHROPLASTY Left 05/14/2013   Procedure: LEFT THUMB CARPAL METACARPAL ARTHROPLASTY;  Surgeon: Tennis Must, MD;  Location: [facility];  Service: Orthopedics;  Laterality: Left;  . TUBAL LIGATION  1987  . VAGINAL HYSTERECTOMY  2009     Current Outpatient Prescriptions  Medication Sig Dispense Refill  . acetaminophen (TYLENOL) 500 MG tablet Take 1 tablet (500 mg total) by mouth every 8 (eight) hours as needed for mild pain, moderate pain or headache. 30 tablet 0  . aspirin EC 81 MG tablet Take 81 mg by mouth daily.    . carvedilol (COREG) 3.125 MG tablet Take 1 tablet (3.125 mg total) by mouth 2 (two) times daily with a meal. 60 tablet 3  . furosemide (  LASIX) 20 MG tablet Take 1 tablet (20 mg total) by mouth daily. 30 tablet 0  . LORazepam (ATIVAN) 0.5 MG tablet Take 1 tablet (0.5 mg total) by mouth every 12 (twelve) hours as needed for anxiety. DO NOT DRIVE WHEN TAKING THIS MEDICATION. (Patient taking differently: Take 1 mg by mouth at bedtime as needed for anxiety or sleep. DO NOT DRIVE WHEN TAKING THIS MEDICATION.) 20 tablet 0  . losartan (COZAAR) 25 MG tablet Take 0.5 tablets (12.5 mg total) by mouth every evening. 15 tablet 0  .  nitroGLYCERIN (NITROSTAT) 0.4 MG SL tablet Place 1 tablet (0.4 mg total) under the tongue every 5 (five) minutes x 3 doses as needed for chest pain. 25 tablet 3  . clopidogrel (PLAVIX) 75 MG tablet Take 1 tablet (75 mg total) by mouth daily. 90 tablet 3   No current facility-administered medications for this visit.     Allergies:   Patient has no known allergies.    Social History:  The patient  reports that she has quit smoking. Her smoking use included Cigarettes. She has a 15.00 pack-year smoking history. She has never used smokeless tobacco. She reports that she drinks about 3.6 oz of alcohol per week . She reports that she uses drugs, including Marijuana.   Family History:  The patient's family history includes Diabetes in her mother; Heart failure in her other; Hypertension in her mother; Stroke in her mother.    ROS: All other systems are reviewed and negative. Unless otherwise mentioned in H&P    PHYSICAL EXAM: VS:  BP 108/70   Pulse 82   Ht 5' (1.524 m)   Wt 112 lb (50.8 kg)   SpO2 99%   BMI 21.87 kg/m  , BMI Body mass index is 21.87 kg/m. GEN: Well nourished, well developed, in no acute distress  HEENT: normal  Neck: no JVD, carotid bruits, or masses Cardiac: RRR; no murmurs, rubs, or gallops,no edema  Respiratory:  Mild crackles in the bases.  GI: soft, nontender, nondistended, + BS MS: no deformity or atrophy  Skin: warm and dry, no rash Neuro:  Strength and sensation are intact Psych: euthymic mood, full affect  Recent Labs: 03/24/2016: ALT 18 05-30-16: B Natriuretic Peptide 783.0; TSH 0.937 05/17/2016: Hemoglobin 11.5; Magnesium 2.0; Platelets 385 05/18/2016: BUN 20; Creatinine, Ser 1.06; Potassium 3.9; Sodium 135    Lipid Panel    Component Value Date/Time   CHOL 107 05/17/2016 0536   TRIG 168 (H) 05/17/2016 0536   HDL 41 05/17/2016 0536   CHOLHDL 2.6 05/17/2016 0536   VLDL 34 05/17/2016 0536   LDLCALC 32 05/17/2016 0536      Wt Readings from Last 3  Encounters:  06/02/16 112 lb (50.8 kg)  05/18/16 110 lb 10.7 oz (50.2 kg)  05/13/16 117 lb (53.1 kg)      Other studies Reviewed: Echocardiogram 05-30-2016 Left ventricle: The cavity size was normal. Wall thickness was   increased in a pattern of mild LVH. Systolic function was   moderately reduced. The estimated ejection fraction was in the   range of 35% to 40%. There is akinesis of the   basal-midinferolateral, inferior, and inferoseptal myocardium.   Doppler parameters are consistent with abnormal left ventricular   relaxation (grade 1 diastolic dysfunction). - Aortic valve: Mildly calcified annulus. Trileaflet. - Mitral valve: There was mild regurgitation. - Tricuspid valve: There was trivial regurgitation. - Pulmonary arteries: PA peak pressure: 22 mm Hg (S). - Pericardium, extracardiac: A small to moderate  pericardial   effusion was identified circumferential to the heart. No obvious   right ventricular compromise. Mitral inflow with respiration does   not suggest definitive tamponade physiology.  Cardiac Cath 03/14/2016  Mid Cx lesion, 0 %stenosed.  A drug eluting .  A STENT PROMUS PREM MR 2.25X16 drug eluting stent was successfully placed.  A STENT PROMUS PREM MR 2.25X8 drug-eluting stent was successfully placed.  Dist RCA lesion, 60 %stenosed.  Post intervention, there is a 0% residual stenosis.  Post Atrio lesion, 0 %stenosed.  Post intervention, there is a 0% residual stenosis.  A stent was successfully placed.   1. Successful Stenting of the distal RCA/PDA with DES x 2.  ASSESSMENT AND PLAN:  1.  Chronic Systolic CHF: Appears well compensated today. No evidence of fluid retention, no weight gain. She continues to have some dyspnea and weakness. 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 She is given a letter to ask for light duty if possible   Continue  coreg, lasix and ARB for now. Repeat echo in 3 months.  2 ASCVD: DES to RCA X 2. Will change Brilinta to Plavix to ascertain if breathing improves. She states she is most dyspneic soon after taking it. Otherwise, continue current regimen.   3. Paraesthesia of the Left leg: Sometimes cannot feel it causing her to fall. I will have a MRI of her lumbar spine to evaluate for disc/nerve impingement. This will need to be followed by PCP with referral if necessary.   4. Tobacco abuse: She has stopped smoking. She is scheduled for PFT's as well.    Current medicines are reviewed at length with the patient today.    Labs/ tests ordered today include:   Orders Placed This Encounter  Procedures  . MR LUMBAR SPINE W CONTRAST     Disposition:   FU with Dr. Bronson Ing in 3 months.   Signed, Jory Sims, NP  06/02/2016 2:22 PM    [facility] [address], [place], [place] [address] Phone: [phone]; Fax: [phone] and [facility]

## 2016-06-03 ENCOUNTER — Encounter (HOSPITAL_COMMUNITY): Payer: PRIVATE HEALTH INSURANCE

## 2016-06-06 ENCOUNTER — Encounter (HOSPITAL_COMMUNITY)
Admission: RE | Admit: 2016-06-06 | Discharge: 2016-06-06 | Disposition: A | Discharge status: Home / Self Care | Payer: PRIVATE HEALTH INSURANCE | Source: Ambulatory Visit | Attending: Cardiology | Admitting: Cardiology

## 2016-06-06 DIAGNOSIS — I214 Non-ST elevation (NSTEMI) myocardial infarction: Secondary | ICD-10-CM

## 2016-06-06 DIAGNOSIS — Z955 Presence of coronary angioplasty implant and graft: Secondary | ICD-10-CM

## 2016-06-06 NOTE — Progress Notes (Signed)
Daily Session Note  Patient Details  Name: Erin [name] MRN: [id number] Date of Birth: Aug 03, 1960 Referring Provider:   Flowsheet Row CARDIAC REHAB PHASE II ORIENTATION from 04/19/2016 in [facility]  Referring Provider  Dr. Radford Pax      Encounter Date: 06/06/2016  Check In:     Session Check In - 06/06/16 1545      Check-In   Location [facility]   Staff Present Diane Angelina Pih, MS, EP, [facility], Exercise Physiologist;Taleisha Kaczynski Wynetta Emery, RN, BSN   Supervising physician immediately available to respond to emergencies See telemetry face sheet for immediately available MD   Medication changes reported     No   Fall or balance concerns reported    Yes   Warm-up and Cool-down Performed as group-led instruction   Resistance Training Performed Yes   VAD Patient? No     Pain Assessment   Currently in Pain? No/denies   Pain Score 0-No pain   Multiple Pain Sites No      Capillary Blood Glucose: No results found for this or any previous visit (from the past 24 hour(s)).   Goals Met:  Independence with exercise equipment Exercise tolerated well No report of cardiac concerns or symptoms Strength training completed today  Goals Unmet:  Not Applicable  Comments: Check out 1645.   Dr. Kate Sable is Medical Director for [facility].

## 2016-06-08 ENCOUNTER — Encounter (HOSPITAL_COMMUNITY): Admission: RE | Admit: 2016-06-08 | Payer: PRIVATE HEALTH INSURANCE | Source: Ambulatory Visit

## 2016-06-08 ENCOUNTER — Ambulatory Visit (HOSPITAL_COMMUNITY)
Admission: RE | Admit: 2016-06-08 | Discharge: 2016-06-08 | Disposition: A | Discharge status: Home / Self Care | Payer: PRIVATE HEALTH INSURANCE | Source: Ambulatory Visit | Attending: Adult Health | Admitting: Adult Health

## 2016-06-08 DIAGNOSIS — R0602 Shortness of breath: Secondary | ICD-10-CM | POA: Diagnosis present

## 2016-06-08 DIAGNOSIS — J989 Respiratory disorder, unspecified: Secondary | ICD-10-CM | POA: Diagnosis not present

## 2016-06-08 LAB — PULMONARY FUNCTION TEST
DL/VA % pred: 75 %
DL/VA: 3.19 ml/min/mmHg/L
DLCO unc % pred: 62 %
DLCO unc: 11.75 ml/min/mmHg
FEF 25-75 Post: 1.33 L/sec
FEF 25-75 Pre: 1.31 L/sec
FEF2575-%Change-Post: 1 %
FEF2575-%Pred-Post: 55 %
FEF2575-%Pred-Pre: 55 %
FEV1-%Change-Post: 0 %
FEV1-%Pred-Post: 77 %
FEV1-%Pred-Pre: 77 %
FEV1-Post: 1.81 L
FEV1-Pre: 1.8 L
FEV1FVC-%Change-Post: 0 %
FEV1FVC-%Pred-Pre: 92 %
FEV6-%Change-Post: 0 %
FEV6-%Pred-Post: 84 %
FEV6-%Pred-Pre: 84 %
FEV6-Post: 2.44 L
FEV6-Pre: 2.45 L
FEV6FVC-%Pred-Post: 103 %
FEV6FVC-%Pred-Pre: 103 %
FVC-%Change-Post: 0 %
FVC-%Pred-Post: 82 %
FVC-%Pred-Pre: 82 %
FVC-Post: 2.44 L
FVC-Pre: 2.45 L
Post FEV1/FVC ratio: 74 %
Post FEV6/FVC ratio: 100 %
Pre FEV1/FVC ratio: 73 %
Pre FEV6/FVC Ratio: 100 %
RV % pred: 110 %
RV: 1.85 L
TLC % pred: 97 %
TLC: 4.35 L

## 2016-06-08 MED ORDER — ALBUTEROL SULFATE (2.5 MG/3ML) 0.083% IN NEBU
2.5000 mg | INHALATION_SOLUTION | Freq: Once | RESPIRATORY_TRACT | Status: AC
  Administered 2016-06-08: 2.5 mg via RESPIRATORY_TRACT

## 2016-06-10 ENCOUNTER — Ambulatory Visit (HOSPITAL_COMMUNITY)
Admission: RE | Admit: 2016-06-10 | Discharge: 2016-06-10 | Disposition: A | Discharge status: Home / Self Care | Payer: PRIVATE HEALTH INSURANCE | Source: Ambulatory Visit | Attending: Adult Health | Admitting: Adult Health

## 2016-06-10 ENCOUNTER — Encounter (HOSPITAL_COMMUNITY): Admission: RE | Admit: 2016-06-10 | Payer: PRIVATE HEALTH INSURANCE | Source: Ambulatory Visit

## 2016-06-10 DIAGNOSIS — R29898 Other symptoms and signs involving the musculoskeletal system: Secondary | ICD-10-CM | POA: Insufficient documentation

## 2016-06-10 DIAGNOSIS — M47897 Other spondylosis, lumbosacral region: Secondary | ICD-10-CM | POA: Insufficient documentation

## 2016-06-10 MED ORDER — GADOBENATE DIMEGLUMINE 529 MG/ML IV SOLN
10.0000 mL | Freq: Once | INTRAVENOUS | Status: AC | PRN
  Administered 2016-06-10: 10 mL via INTRAVENOUS

## 2016-06-10 NOTE — Progress Notes (Signed)
Cardiology Office Note   Date:  06/13/2016   ID:  Erin [name], Erin [name] 07/11/1960, MRN [id number]  PCP:  Jonathon Bellows, MD  Cardiologist: Lysle Pearl, MD   No chief complaint on file.     History of Present Illness: Erin [name] is a 56 y.o. female patient of Dr Erin [name] and Erin [name] Added on to my schedule for management of chronic dyspnea, coronary artery disease with drug-eluting stent to the right coronary artery and PDA 2, ischemic cardiomyopathy with most recent ejection fraction between 35-40% . Other history includes anxiety, chronic chest tightness, and GERD. The patient was seen on consultation during recent hospitalization on 05/16/2016 for recurrent chest discomfort. She had acute hypoxic respiratory failure due to acute on chronic systolic heart failure. She was given IV diuresis, placed on Entresto, but this was not continued on discharge instead she was placed on losartan. She was sent home on by mouth Lasix 20 mg daily, continued on carvedilol and Brilinta. She will need follow-up BMET.  Seen by NP 06/02/16 complaining of losing  feeling in her left leg when she gets out of bed or out of the bathtub. She states that she cannot feel it at all and often falls. She has not reported this to her PCP. She also has continued issues with breathing after taking Brilinta. She has stopped smoking.  She denies fluid retention or PND. Brillinita stopped and started on plavix   MRI lumbar spine has been ordered   Past Medical History:  Diagnosis Date  . Anxiety state 03/25/2016  . Basal cell carcinoma of forehead   . Coronary artery disease    a. 03/11/16 PCI with DES-->Prox/Mid Cx, 03/14/16 PCI with DES x2-->RCA, EF 30-35%  . Essential hypertension   . GERD (gastroesophageal reflux disease)   . History of pneumonia   . Hyperlipidemia   . Ischemic cardiomyopathy   . Mitral regurgitation   . NSTEMI (non-ST elevated myocardial infarction) ([place]) 03/10/2016  .  Squamous cell cancer of skin of nose   . Thrombocytosis ([place]) 03/26/2016  . Wears dentures   . Wears glasses     Past Surgical History:  Procedure Laterality Date  . APPENDECTOMY    . CARDIAC CATHETERIZATION N/A 03/11/2016   Procedure: Left Heart Cath and Coronary Angiography;  Surgeon: Leonie Man, MD;  Location: [facility];  Service: Cardiovascular;  Laterality: N/A;  . CARDIAC CATHETERIZATION N/A 03/11/2016   Procedure: Coronary Stent Intervention;  Surgeon: Leonie Man, MD;  Location: [facility];  Service: Cardiovascular;  Laterality: N/A;  . CARDIAC CATHETERIZATION N/A 03/14/2016   Procedure: Coronary Stent Intervention;  Surgeon: Tri Chittick M Martinique, MD;  Location: [facility];  Service: Cardiovascular;  Laterality: N/A;  . CHOLECYSTECTOMY OPEN  1984  . CORONARY ANGIOPLASTY WITH STENT PLACEMENT  03/14/2016  . FINGER ARTHROPLASTY Left 05/14/2013   Procedure: LEFT THUMB CARPAL METACARPAL ARTHROPLASTY;  Surgeon: Tennis Must, MD;  Location: [facility];  Service: Orthopedics;  Laterality: Left;  . TUBAL LIGATION  1987  . VAGINAL HYSTERECTOMY  2009     Current Outpatient Prescriptions  Medication Sig Dispense Refill  . acetaminophen (TYLENOL) 500 MG tablet Take 1 tablet (500 mg total) by mouth every 8 (eight) hours as needed for mild pain, moderate pain or headache. 30 tablet 0  . aspirin EC 81 MG tablet Take 81 mg by mouth daily.    . carvedilol (COREG) 3.125 MG tablet Take 1  tablet (3.125 mg total) by mouth 2 (two) times daily with a meal. 60 tablet 3  . clopidogrel (PLAVIX) 75 MG tablet Take 1 tablet (75 mg total) by mouth daily. 90 tablet 3  . furosemide (LASIX) 20 MG tablet Take 1 tablet (20 mg total) by mouth daily. 30 tablet 0  . LORazepam (ATIVAN) 0.5 MG tablet Take 1 tablet (0.5 mg total) by mouth every 12 (twelve) hours as needed for anxiety. DO NOT DRIVE WHEN TAKING THIS MEDICATION. (Patient taking differently: Take 1 mg by mouth at  bedtime as needed for anxiety or sleep. DO NOT DRIVE WHEN TAKING THIS MEDICATION.) 20 tablet 0  . losartan (COZAAR) 25 MG tablet Take 0.5 tablets (12.5 mg total) by mouth every evening. 15 tablet 0  . nitroGLYCERIN (NITROSTAT) 0.4 MG SL tablet Place 1 tablet (0.4 mg total) under the tongue every 5 (five) minutes x 3 doses as needed for chest pain. 25 tablet 3   No current facility-administered medications for this visit.     Allergies:   Patient has no known allergies.    Social History:  The patient  reports that she has quit smoking. Her smoking use included Cigarettes. She has a 15.00 pack-year smoking history. She has never used smokeless tobacco. She reports that she drinks about 3.6 oz of alcohol per week . She reports that she uses drugs, including Marijuana.   Family History:  The patient's family history includes Diabetes in her mother; Heart failure in her other; Hypertension in her mother; Stroke in her mother.    ROS: All other systems are reviewed and negative. Unless otherwise mentioned in H&P    PHYSICAL EXAM: VS:  BP 116/78   Pulse 83   Ht 5' (1.524 m)   Wt 113 lb (51.3 kg)   SpO2 98%   BMI 22.07 kg/m  , BMI Body mass index is 22.07 kg/m. GEN: Well nourished, well developed, in no acute distress  HEENT: normal  Neck: no JVD, carotid bruits, or masses Cardiac: RRR; no murmurs, rubs, or gallops,no edema  Respiratory:  Mild crackles in the bases.  GI: soft, nontender, nondistended, + BS MS: no deformity or atrophy  Skin: warm and dry, no rash Neuro:  Strength and sensation are intact Psych: euthymic mood, full affect  Recent Labs: 03/24/2016: ALT 18 06-13-2016: B Natriuretic Peptide 783.0; TSH 0.937 05/17/2016: Hemoglobin 11.5; Magnesium 2.0; Platelets 385 05/18/2016: BUN 20; Creatinine, Ser 1.06; Potassium 3.9; Sodium 135    Lipid Panel    Component Value Date/Time   CHOL 107 05/17/2016 0536   TRIG 168 (H) 05/17/2016 0536   HDL 41 05/17/2016 0536   CHOLHDL  2.6 05/17/2016 0536   VLDL 34 05/17/2016 0536   LDLCALC 32 05/17/2016 0536      Wt Readings from Last 3 Encounters:  06/13/16 113 lb (51.3 kg)  06/02/16 112 lb (50.8 kg)  05/18/16 110 lb 10.7 oz (50.2 kg)      Other studies Reviewed: Echocardiogram 06-13-16 Left ventricle: The cavity size was normal. Wall thickness was   increased in a pattern of mild LVH. Systolic function was   moderately reduced. The estimated ejection fraction was in the   range of 35% to 40%. There is akinesis of the   basal-midinferolateral, inferior, and inferoseptal myocardium.   Doppler parameters are consistent with abnormal left ventricular   relaxation (grade 1 diastolic dysfunction). - Aortic valve: Mildly calcified annulus. Trileaflet. - Mitral valve: There was mild regurgitation. - Tricuspid valve: There was  trivial regurgitation. - Pulmonary arteries: PA peak pressure: 22 mm Hg (S). - Pericardium, extracardiac: A small to moderate pericardial   effusion was identified circumferential to the heart. No obvious   right ventricular compromise. Mitral inflow with respiration does   not suggest definitive tamponade physiology.  Cardiac Cath 03/14/2016  Mid Cx lesion, 0 %stenosed.  A drug eluting .  A STENT PROMUS PREM MR 2.25X16 drug eluting stent was successfully placed.  A STENT PROMUS PREM MR 2.25X8 drug-eluting stent was successfully placed.  Dist RCA lesion, 60 %stenosed.  Post intervention, there is a 0% residual stenosis.  Post Atrio lesion, 0 %stenosed.  Post intervention, there is a 0% residual stenosis.  A stent was successfully placed.   1. Successful Stenting of the distal RCA/PDA with DES x 2.  ASSESSMENT AND PLAN:  1.  Chronic Systolic CHF: Appears well compensated today. No evidence of fluid retention, no weight gain. She continues to have some dyspnea better off Brilinta PFTls are pending  Continue coreg, lasix and ARB for now. Repeat echo in 6 months.  2 ASCVD: DES  to RCA X 2. Less dyspnea on plavix and Brillinta stopped Cardiac rehab  3. Paraesthesia of the Left leg: Sometimes cannot feel it causing her to fall. I will have a MRI of her lumbar spine to evaluate for disc/nerve impingement. This will need to be followed by PCP with referral if necessary. ABI"s to make sure circulation ok Pulses seem fine    4. Tobacco abuse: She has stopped smoking. She is scheduled for PFT's as well. No active wheezing suspect some compnent of  COPD    Current medicines are reviewed at length with the patient today.    Labs/ tests ordered today include:   No orders of the defined types were placed in this encounter.    Disposition:   FU with Dr. Bronson Ing in 6 months.   Signed, Jenkins Rouge, MD  06/13/2016 2:13 PM    [facility] [address], [place], [place] [address] Phone: [phone]; Fax: [phone] and interstitial

## 2016-06-13 ENCOUNTER — Encounter (HOSPITAL_COMMUNITY): Admission: RE | Admit: 2016-06-13 | Payer: PRIVATE HEALTH INSURANCE | Source: Ambulatory Visit

## 2016-06-13 ENCOUNTER — Telehealth: Payer: Self-pay | Admitting: *Deleted

## 2016-06-13 ENCOUNTER — Encounter: Payer: Self-pay | Admitting: Cardiovascular Disease

## 2016-06-13 ENCOUNTER — Ambulatory Visit (INDEPENDENT_AMBULATORY_CARE_PROVIDER_SITE_OTHER): Payer: PRIVATE HEALTH INSURANCE | Admitting: Cardiovascular Disease

## 2016-06-13 VITALS — BP 116/78 | HR 83 | Ht 60.0 in | Wt 113.0 lb

## 2016-06-13 DIAGNOSIS — I2510 Atherosclerotic heart disease of native coronary artery without angina pectoris: Secondary | ICD-10-CM

## 2016-06-13 DIAGNOSIS — I739 Peripheral vascular disease, unspecified: Secondary | ICD-10-CM

## 2016-06-13 MED ORDER — FUROSEMIDE 20 MG PO TABS: 20.0000 mg | ORAL_TABLET | Freq: Every day | ORAL | 6 refills | Status: DC

## 2016-06-13 NOTE — Patient Instructions (Signed)
Your physician wants you to follow-up in: 6 Months with Jory Sims, NP. You will receive a reminder letter in the mail two months in advance. If you don't receive a letter, please call our office to schedule the follow-up appointment.  Your physician recommends that you continue on your current medications as directed. Please refer to the Current Medication list given to you today.  Your physician has requested that you have an echocardiogram. Echocardiography is a painless test that uses sound waves to create images of your heart. It provides your doctor with information about the size and shape of your heart and how well your heart's chambers and valves are working. This procedure takes approximately one hour. There are no restrictions for this procedure.  Your physician has requested that you have an ankle brachial index (ABI). During this test an ultrasound and blood pressure cuff are used to evaluate the arteries that supply the arms and legs with blood. Allow thirty minutes for this exam. There are no restrictions or special instructions.  If you need a refill on your cardiac medications before your next appointment, please call your pharmacy.  Thank you for choosing [facility]!

## 2016-06-13 NOTE — Addendum Note (Signed)
Addended by: Levonne Hubert on: 06/13/2016 02:21 PM   Modules accepted: Orders

## 2016-06-13 NOTE — Addendum Note (Signed)
Addended by: Levonne Hubert on: 06/13/2016 02:26 PM   Modules accepted: Orders

## 2016-06-13 NOTE — Telephone Encounter (Signed)
-----   Message from Lendon Colonel, NP sent at 06/10/2016  4:27 PM EST ----- MRI was negative for any issues resulting in your symptoms. Please follow up with your primary care physician for more recommendations and treatment options.

## 2016-06-13 NOTE — Telephone Encounter (Signed)
Called patient with test results. No answer. Left message to call back.  

## 2016-06-15 ENCOUNTER — Encounter (HOSPITAL_COMMUNITY)
Admission: RE | Admit: 2016-06-15 | Discharge: 2016-06-15 | Disposition: A | Discharge status: Home / Self Care | Payer: PRIVATE HEALTH INSURANCE | Source: Ambulatory Visit | Attending: Cardiology | Admitting: Cardiology

## 2016-06-15 DIAGNOSIS — I214 Non-ST elevation (NSTEMI) myocardial infarction: Secondary | ICD-10-CM | POA: Insufficient documentation

## 2016-06-15 DIAGNOSIS — Z955 Presence of coronary angioplasty implant and graft: Secondary | ICD-10-CM | POA: Diagnosis not present

## 2016-06-15 NOTE — Progress Notes (Signed)
Daily Session Note  Patient Details  Name: ROXSANA RIDING MRN: [id number] Date of Birth: 11/16/1960 Referring Provider:   Flowsheet Row CARDIAC REHAB PHASE II ORIENTATION from 04/19/2016 in [facility]  Referring Provider  Dr. Radford Pax      Encounter Date: 06/15/2016  Check In:     Session Check In - 06/15/16 1545      Check-In   Location [facility]   Staff Present Diane Angelina Pih, MS, EP, [facility], Exercise Physiologist;Alayiah Fontes Wynetta Emery, RN, BSN   Supervising physician immediately available to respond to emergencies See telemetry face sheet for immediately available MD   Medication changes reported     No   Fall or balance concerns reported    No   Warm-up and Cool-down Performed as group-led instruction   Resistance Training Performed Yes   VAD Patient? No     Pain Assessment   Currently in Pain? No/denies   Pain Score 0-No pain   Multiple Pain Sites No      Capillary Blood Glucose: No results found for this or any previous visit (from the past 24 hour(s)).   Goals Met:  Independence with exercise equipment Exercise tolerated well No report of cardiac concerns or symptoms Strength training completed today  Goals Unmet:  Not Applicable  Comments: Check out 1645.   Dr. Kate Sable is Medical Director for [facility].

## 2016-06-16 ENCOUNTER — Ambulatory Visit (HOSPITAL_COMMUNITY)
Admission: RE | Admit: 2016-06-16 | Discharge: 2016-06-16 | Disposition: A | Discharge status: Home / Self Care | Payer: PRIVATE HEALTH INSURANCE | Source: Ambulatory Visit | Attending: Cardiovascular Disease | Admitting: Cardiovascular Disease

## 2016-06-16 DIAGNOSIS — I739 Peripheral vascular disease, unspecified: Secondary | ICD-10-CM | POA: Diagnosis not present

## 2016-06-16 NOTE — Progress Notes (Signed)
Cardiac Individual Treatment Plan  Patient Details  Name: Erin [name] MRN: [id number] Date of Birth: 03-Jan-1961 Referring Provider:   Flowsheet Row CARDIAC REHAB PHASE II ORIENTATION from 04/19/2016 in [place]  Referring Provider  Dr. Radford Pax      Initial Encounter Date:  Flowsheet Row CARDIAC REHAB PHASE II ORIENTATION from 04/19/2016 in [place]  Date  04/19/16  Referring Provider  Dr. Radford Pax      Visit Diagnosis: NSTEMI (non-ST elevated myocardial infarction) [facility])  Status post coronary artery stent placement  Patient's Home Medications on Admission:  Current Outpatient Prescriptions:  .  acetaminophen (TYLENOL) 500 MG tablet, Take 1 tablet (500 mg total) by mouth every 8 (eight) hours as needed for mild pain, moderate pain or headache., Disp: 30 tablet, Rfl: 0 .  aspirin EC 81 MG tablet, Take 81 mg by mouth daily., Disp: , Rfl:  .  carvedilol (COREG) 3.125 MG tablet, Take 1 tablet (3.125 mg total) by mouth 2 (two) times daily with a meal., Disp: 60 tablet, Rfl: 3 .  clopidogrel (PLAVIX) 75 MG tablet, Take 1 tablet (75 mg total) by mouth daily., Disp: 90 tablet, Rfl: 3 .  furosemide (LASIX) 20 MG tablet, Take 1 tablet (20 mg total) by mouth daily., Disp: 30 tablet, Rfl: 6 .  LORazepam (ATIVAN) 0.5 MG tablet, Take 1 tablet (0.5 mg total) by mouth every 12 (twelve) hours as needed for anxiety. DO NOT DRIVE WHEN TAKING THIS MEDICATION. (Patient taking differently: Take 1 mg by mouth at bedtime as needed for anxiety or sleep. DO NOT DRIVE WHEN TAKING THIS MEDICATION.), Disp: 20 tablet, Rfl: 0 .  losartan (COZAAR) 25 MG tablet, Take 0.5 tablets (12.5 mg total) by mouth every evening., Disp: 15 tablet, Rfl: 0 .  nitroGLYCERIN (NITROSTAT) 0.4 MG SL tablet, Place 1 tablet (0.4 mg total) under the tongue every 5 (five) minutes x 3 doses as needed for chest pain., Disp: 25 tablet, Rfl: 3  Past Medical History: Past Medical History:   Diagnosis Date  . Anxiety state 03/25/2016  . Basal cell carcinoma of forehead   . Coronary artery disease    a. 03/11/16 PCI with DES-->Prox/Mid Cx, 03/14/16 PCI with DES x2-->RCA, EF 30-35%  . Essential hypertension   . GERD (gastroesophageal reflux disease)   . History of pneumonia   . Hyperlipidemia   . Ischemic cardiomyopathy   . Mitral regurgitation   . NSTEMI (non-ST elevated myocardial infarction) ([place]) 03/10/2016  . Squamous cell cancer of skin of nose   . Thrombocytosis ([place]) 03/26/2016  . Wears dentures   . Wears glasses     Tobacco Use: History  Smoking Status  . Former Smoker  . Packs/day: 1.00  . Years: 15.00  . Types: Cigarettes  Smokeless Tobacco  . Never Used    Comment: quit 2 weeks ago-03/24/16    Labs: Recent Review Flowsheet Data    Labs for ITP Cardiac and Pulmonary Rehab Latest Ref Rng & Units 09/06/2015 01/15/2016 03/11/2016 05/16/2016 05/17/2016   Cholestrol 0 - 200 mg/dL 229(H) 273(H) - - 107   LDLCALC 0 - 99 mg/dL 130(H) 146(H) - - 32   HDL >40 mg/dL 34(L) 53 - - 41   Trlycerides <150 mg/dL 324(H) 371(H) - - 168(H)   Hemoglobin A1c 4.8 - 5.6 % 5.7(H) - - 5.4 -   TCO2 0 - 100 mmol/L - - 22 - -      Capillary Blood Glucose: No results found for: GLUCAP  Exercise Target Goals:    Exercise Program Goal: Individual exercise prescription set with THRR, safety & activity barriers. Participant demonstrates ability to understand and report RPE using BORG scale, to self-measure pulse accurately, and to acknowledge the importance of the exercise prescription.  Exercise Prescription Goal: Starting with aerobic activity 30 plus minutes a day, 3 days per week for initial exercise prescription. Provide home exercise prescription and guidelines that participant acknowledges understanding prior to discharge.  Activity Barriers & Risk Stratification:     Activity Barriers & Cardiac Risk Stratification - 04/19/16 1015      Activity Barriers & Cardiac Risk  Stratification   Activity Barriers None   Cardiac Risk Stratification High      6 Minute Walk:     6 Minute Walk    Row Name 04/19/16 1011         6 Minute Walk   Phase Initial     Distance 1250 feet     Distance % Change 0 %     Walk Time 6 minutes     # of Rest Breaks 0     MPH 2.36     METS 2.81     RPE 9     Perceived Dyspnea  13     VO2 Peak 13.15     Symptoms No     Resting HR 92 bpm     Resting BP 106/74     Max Ex. HR 108 bpm     Max Ex. BP 120/74     2 Minute Post BP 106/72        Initial Exercise Prescription:     Initial Exercise Prescription - 04/19/16 1000      Date of Initial Exercise RX and Referring Provider   Date 04/19/16   Referring Provider Dr. Radford Pax     Treadmill   MPH 1.3   Grade 0   Minutes 20   METs 1.9     NuStep   Level 2   Watts 10   Minutes 15   METs 1.6     Prescription Details   Frequency (times per week) 3   Duration Progress to 30 minutes of continuous aerobic without signs/symptoms of physical distress     Intensity   THRR REST +  30   THRR 40-80% of Max Heartrate [phone]   Ratings of Perceived Exertion 11-13   Perceived Dyspnea 0-4     Progression   Progression Continue progressive overload as per policy without signs/symptoms or physical distress.     Resistance Training   Training Prescription Yes   Weight 1   Reps 10-12      Perform Capillary Blood Glucose checks as needed.  Exercise Prescription Changes:      Exercise Prescription Changes    Row Name 05/16/16 1400 06/16/16 0700           Exercise Review   Progression Yes Yes        Response to Exercise   Blood Pressure (Admit) 126/72 100/60      Blood Pressure (Exercise) 150/84 122/70      Blood Pressure (Exit) 132/68 100/62      Heart Rate (Admit) 87 bpm 68 bpm      Heart Rate (Exercise) 120 bpm 67 bpm      Heart Rate (Exit) 91 bpm 85 bpm      Rating of Perceived Exertion (Exercise) 13 11      Duration Progress to 30 minutes  of continuous  aerobic without signs/symptoms of physical distress Progress to 30 minutes of continuous aerobic without signs/symptoms of physical distress      Intensity Rest + 30 Rest + 30        Progression   Progression Continue progressive overload as per policy without signs/symptoms or physical distress. Continue progressive overload as per policy without signs/symptoms or physical distress.        Resistance Training   Training Prescription Yes Yes      Weight 1 1      Reps 10-12 10-12        Treadmill   MPH 1.5 2      Grade 0 4      Minutes 20 20      METs 2.1 2        NuStep   Level 2 2      Watts 15 13      Minutes 15 15      METs 3.23 3.22        Home Exercise Plan   Plans to continue exercise at [place] [place] 2 additional days to program exercise sessions. Add 2 additional days to program exercise sessions.         Exercise Comments:      Exercise Comments    Row Name 05/16/16 1420 06/16/16 0757         Exercise Comments Patient is progressing well. Patient is progressing well.          Discharge Exercise Prescription (Final Exercise Prescription Changes):     Exercise Prescription Changes - 06/16/16 0700      Exercise Review   Progression Yes     Response to Exercise   Blood Pressure (Admit) 100/60   Blood Pressure (Exercise) 122/70   Blood Pressure (Exit) 100/62   Heart Rate (Admit) 68 bpm   Heart Rate (Exercise) 67 bpm   Heart Rate (Exit) 85 bpm   Rating of Perceived Exertion (Exercise) 11   Duration Progress to 30 minutes of continuous aerobic without signs/symptoms of physical distress   Intensity Rest + 30     Progression   Progression Continue progressive overload as per policy without signs/symptoms or physical distress.     Resistance Training   Training Prescription Yes   Weight 1   Reps 10-12     Treadmill   MPH 2   Grade 4   Minutes 20   METs 2     NuStep   Level 2   Watts 13   Minutes 15   METs  3.22     Home Exercise Plan   Plans to continue exercise at Home   Frequency Add 2 additional days to program exercise sessions.      Nutrition:  Target Goals: Understanding of nutrition guidelines, daily intake of sodium 1500mg , cholesterol 200mg , calories 30% from fat and 7% or less from saturated fats, daily to have 5 or more servings of fruits and vegetables.  Biometrics:     Pre Biometrics - 04/19/16 1014      Pre Biometrics   Height 5' (1.524 m)   Weight 116 lb 2.9 oz (52.7 kg)   Waist Circumference 30 inches   Hip Circumference 34.5 inches   Waist to Hip Ratio 0.87 %   BMI (Calculated) 22.7   Triceps Skinfold 14 mm   % Body Fat 30.5 %   Grip Strength 44.3 kg   Flexibility 15.3 in   Single Leg  Stand 14 seconds       Nutrition Therapy Plan and Nutrition Goals:   Nutrition Discharge: Rate Your Plate Scores:     Nutrition Assessments - 04/19/16 1021      MEDFICTS Scores   Pre Score 49      Nutrition Goals Re-Evaluation:   Psychosocial: Target Goals: Acknowledge presence or absence of depression, maximize coping skills, provide positive support system. Participant is able to verbalize types and ability to use techniques and skills needed for reducing stress and depression.  Initial Review & Psychosocial Screening:     Initial Psych Review & Screening - 04/19/16 1025      Initial Review   Current issues with Current Depression;Current Anxiety/Panic     Family Dynamics   Good Support System? Yes     Barriers   Psychosocial barriers to participate in program There are no identifiable barriers or psychosocial needs.  Patient is anxious about having another heart event, especially before bedtime. Concerned about her finances.      Screening Interventions   Interventions Encouraged to exercise      Quality of Life Scores:     Quality of Life - 04/19/16 1015      Quality of Life Scores   Health/Function Pre 10.87 %   Socioeconomic Pre 22 %    Psych/Spiritual Pre 16.29 %   Family Pre 20.4 %   GLOBAL Pre 15.68 %      PHQ-9: Recent Review Flowsheet Data    Depression screen [facility] 2/9 04/19/2016   Decreased Interest 2   Down, Depressed, Hopeless 3   PHQ - 2 Score 5   Altered sleeping 3   Tired, decreased energy 3   Change in appetite 3   Feeling bad or failure about yourself  3   Trouble concentrating 3   Moving slowly or fidgety/restless 0   Suicidal thoughts 0   PHQ-9 Score 20   Difficult doing work/chores Somewhat difficult      Psychosocial Evaluation and Intervention:     Psychosocial Evaluation - 04/19/16 1026      Psychosocial Evaluation & Interventions   Interventions Relaxation education;Stress management education   Continued Psychosocial Services Needed Yes      Psychosocial Re-Evaluation:     Psychosocial Re-Evaluation    Row Name 05/19/16 0808 06/16/16 1530           Psychosocial Re-Evaluation   Interventions Encouraged to attend Cardiac Rehabilitation for the exercise Encouraged to attend Cardiac Rehabilitation for the exercise      Comments Patient has depression/anxiety. Her QOL was 15.68 and her PHQ-9 was 20. She continues to say she does not need therapy.  Patient has anxiety/depression. She continues to take anti-anxiety medication as treatment.       Continued Psychosocial Services Needed Yes Yes         Vocational Rehabilitation: Provide vocational rehab assistance to qualifying candidates.   Vocational Rehab Evaluation & Intervention:     Vocational Rehab - 04/19/16 1019      Initial Vocational Rehab Evaluation & Intervention   Assessment shows need for Vocational Rehabilitation No      Education: Education Goals: Education classes will be provided on a weekly basis, covering required topics. Participant will state understanding/return demonstration of topics presented.  Learning Barriers/Preferences:     Learning Barriers/Preferences - 04/19/16 1015      Learning  Barriers/Preferences   Learning Barriers None   Learning Preferences Video;Written Material;Pictoral;Computer/Internet      Education Topics: Hypertension,  Hypertension Reduction -Define heart disease and high blood pressure. Discus how high blood pressure affects the body and ways to reduce high blood pressure.   Exercise and Your Heart -Discuss why it is important to exercise, the FITT principles of exercise, normal and abnormal responses to exercise, and how to exercise safely. Flowsheet Row CARDIAC REHAB PHASE II EXERCISE from 06/15/2016 in [place]  Date  06/15/16  Educator  DC  Instruction Review Code  2- meets goals/outcomes      Angina -Discuss definition of angina, causes of angina, treatment of angina, and how to decrease risk of having angina.   Cardiac Medications -Review what the following cardiac medications are used for, how they affect the body, and side effects that may occur when taking the medications.  Medications include Aspirin, Beta blockers, calcium channel blockers, ACE Inhibitors, angiotensin receptor blockers, diuretics, digoxin, and antihyperlipidemics.   Congestive Heart Failure -Discuss the definition of CHF, how to live with CHF, the signs and symptoms of CHF, and how keep track of weight and sodium intake.   Heart Disease and Intimacy -Discus the effect sexual activity has on the heart, how changes occur during intimacy as we age, and safety during sexual activity.   Smoking Cessation / COPD -Discuss different methods to quit smoking, the health benefits of quitting smoking, and the definition of COPD.   Nutrition I: Fats -Discuss the types of cholesterol, what cholesterol does to the heart, and how cholesterol levels can be controlled. Flowsheet Row CARDIAC REHAB PHASE II EXERCISE from 06/15/2016 in [place]  Date  04/27/16  Educator  Russella Dar  Instruction Review Code  2- meets goals/outcomes       Nutrition II: Labels -Discuss the different components of food labels and how to read food label [place] from 06/15/2016 in [place]  Date  05/04/16  Educator  Russella Dar  Instruction Review Code  2- meets goals/outcomes      Heart Parts and Heart Disease -Discuss the anatomy of the heart, the pathway of blood circulation through the heart, and these are affected by heart disease.   Stress I: Signs and Symptoms -Discuss the causes of stress, how stress may lead to anxiety and depression, and ways to limit stress.   Stress II: Relaxation -Discuss different types of relaxation techniques to limit stress.   Warning Signs of Stroke / TIA -Discuss definition of a stroke, what the signs and symptoms are of a stroke, and how to identify when someone is having stroke.   Knowledge Questionnaire Score:     Knowledge Questionnaire Score - 04/19/16 1019      Knowledge Questionnaire Score   Pre Score 22/28      Core Components/Risk Factors/Patient Goals at Admission:     Personal Goals and Risk Factors at Admission - 04/19/16 1021      Core Components/Risk Factors/Patient Goals on Admission    Weight Management Weight Maintenance   Increase Strength and Stamina Yes   Intervention Provide advice, education, support and counseling about physical activity/exercise needs.;Develop an individualized exercise prescription for aerobic and resistive training based on initial evaluation findings, risk stratification, comorbidities and participant's personal goals.   Expected Outcomes Achievement of increased cardiorespiratory fitness and enhanced flexibility, muscular endurance and strength shown through measurements of functional capacity and personal statement of participant.   Tobacco Cessation Yes   Intervention Assist the participant in steps to quit. Provide individualized education and  counseling about committing to  Tobacco Cessation, relapse prevention, and pharmacological support that can be provided by physician.;Advice worker, assist with locating and accessing local/national Quit Smoking programs, and support quit date choice.   Expected Outcomes Long Term: Complete abstinence from all tobacco products for at least 12 months from quit date.;Short Term: Will demonstrate readiness to quit, by selecting a quit date.   Stress Yes   Intervention Offer individual and/or small group education and counseling on adjustment to heart disease, stress management and health-related lifestyle change. Teach and support self-help strategies.;Refer participants experiencing significant psychosocial distress to appropriate mental health specialists for further evaluation and treatment. When possible, include family members and significant others in education/counseling sessions.   Expected Outcomes Short Term: Participant demonstrates changes in health-related behavior, relaxation and other stress management skills, ability to obtain effective social support, and compliance with psychotropic medications if prescribed.;Long Term: Emotional wellbeing is indicated by absence of clinically significant psychosocial distress or social isolation.   Personal Goal Gain Strength, be where I was before event.    Intervention Attend CR 3 x week and supplement 2 x week at home.    Expected Outcomes Reach personal goals.       Core Components/Risk Factors/Patient Goals Review:      Goals and Risk Factor Review    Row Name 04/19/16 1024 05/19/16 0757 06/16/16 1527         Core Components/Risk Factors/Patient Goals Review   Personal Goals Review Increase Strength and Stamina;Stress;Tobacco Cessation Weight Management/Obesity;Increase Strength and Stamina;Tobacco Cessation  Gain Strength, be where I was before event.  Weight Management/Obesity;Increase Strength and Stamina;Tobacco Cessation  Gain strength; be where I was  before event.     Review  - Patient has attended 5 sessions. She is currently hospitalized d/t acute CHF. Will continue to monitor for progress.  Patient has attended 8 sessions. She has been inconsistent in her attendance d/t family issues and health issues. She has had some progression. She continues to smoke. Will continue to monitor.      Expected Outcomes  - Patient will be able to complete the program meeting her personal goals.  Patient will attend more consistently and complete the program meeting her personal goals.         Core Components/Risk Factors/Patient Goals at Discharge (Final Review):      Goals and Risk Factor Review - 06/16/16 1527      Core Components/Risk Factors/Patient Goals Review   Personal Goals Review Weight Management/Obesity;Increase Strength and Stamina;Tobacco Cessation  Gain strength; be where I was before event.   Review Patient has attended 8 sessions. She has been inconsistent in her attendance d/t family issues and health issues. She has had some progression. She continues to smoke. Will continue to monitor.    Expected Outcomes Patient will attend more consistently and complete the program meeting her personal goals.       ITP Comments:     ITP Comments    Row Name 04/19/16 1104 04/20/16 1322         ITP Comments Virigina [place]bury is still smoking just 1/2 cigarette/day. Will give her information about our somoking cessation program.  Patient new to program. Will start 04/25/16.         Comments: ITP 30 Day REVIEW Patient is doing well in the program. Will continue to monitor for progress.

## 2016-06-17 ENCOUNTER — Encounter (HOSPITAL_COMMUNITY): Payer: PRIVATE HEALTH INSURANCE

## 2016-06-20 ENCOUNTER — Encounter (HOSPITAL_COMMUNITY): Payer: Self-pay | Admitting: Emergency Medicine

## 2016-06-20 ENCOUNTER — Emergency Department (HOSPITAL_COMMUNITY): Payer: PRIVATE HEALTH INSURANCE

## 2016-06-20 ENCOUNTER — Encounter (HOSPITAL_COMMUNITY): Admission: RE | Admit: 2016-06-20 | Payer: PRIVATE HEALTH INSURANCE | Source: Ambulatory Visit

## 2016-06-20 ENCOUNTER — Observation Stay (HOSPITAL_COMMUNITY)
Admission: EM | Admit: 2016-06-20 | Discharge: 2016-06-20 | Disposition: A | Discharge status: Home / Self Care | Payer: PRIVATE HEALTH INSURANCE | Attending: Internal Medicine | Admitting: Internal Medicine

## 2016-06-20 DIAGNOSIS — R0602 Shortness of breath: Secondary | ICD-10-CM | POA: Diagnosis not present

## 2016-06-20 DIAGNOSIS — I10 Essential (primary) hypertension: Secondary | ICD-10-CM | POA: Diagnosis present

## 2016-06-20 DIAGNOSIS — Z79899 Other long term (current) drug therapy: Secondary | ICD-10-CM | POA: Diagnosis not present

## 2016-06-20 DIAGNOSIS — R079 Chest pain, unspecified: Secondary | ICD-10-CM | POA: Diagnosis present

## 2016-06-20 DIAGNOSIS — I110 Hypertensive heart disease with heart failure: Secondary | ICD-10-CM | POA: Insufficient documentation

## 2016-06-20 DIAGNOSIS — K219 Gastro-esophageal reflux disease without esophagitis: Secondary | ICD-10-CM

## 2016-06-20 DIAGNOSIS — Z7982 Long term (current) use of aspirin: Secondary | ICD-10-CM | POA: Diagnosis not present

## 2016-06-20 DIAGNOSIS — Z87891 Personal history of nicotine dependence: Secondary | ICD-10-CM | POA: Insufficient documentation

## 2016-06-20 DIAGNOSIS — I5042 Chronic combined systolic (congestive) and diastolic (congestive) heart failure: Secondary | ICD-10-CM | POA: Diagnosis present

## 2016-06-20 DIAGNOSIS — I25119 Atherosclerotic heart disease of native coronary artery with unspecified angina pectoris: Secondary | ICD-10-CM

## 2016-06-20 DIAGNOSIS — R0789 Other chest pain: Principal | ICD-10-CM | POA: Insufficient documentation

## 2016-06-20 DIAGNOSIS — I509 Heart failure, unspecified: Secondary | ICD-10-CM | POA: Diagnosis not present

## 2016-06-20 DIAGNOSIS — I2510 Atherosclerotic heart disease of native coronary artery without angina pectoris: Secondary | ICD-10-CM | POA: Diagnosis present

## 2016-06-20 DIAGNOSIS — E876 Hypokalemia: Secondary | ICD-10-CM

## 2016-06-20 DIAGNOSIS — E782 Mixed hyperlipidemia: Secondary | ICD-10-CM

## 2016-06-20 DIAGNOSIS — E785 Hyperlipidemia, unspecified: Secondary | ICD-10-CM | POA: Diagnosis present

## 2016-06-20 HISTORY — DX: Heart failure, unspecified: I50.9

## 2016-06-20 LAB — BASIC METABOLIC PANEL
Anion gap: 9 (ref 5–15)
BUN: 15 mg/dL (ref 6–20)
CO2: 24 mmol/L (ref 22–32)
Calcium: 8.9 mg/dL (ref 8.9–10.3)
Chloride: 105 mmol/L (ref 101–111)
Creatinine, Ser: 0.76 mg/dL (ref 0.44–1.00)
GFR calc Af Amer: >60 mL/min (ref >60)
GFR calc non Af Amer: >60 mL/min (ref >60)
Glucose, Bld: 107 mg/dL — ABNORMAL HIGH (ref 65–99)
Potassium: 3 mmol/L — ABNORMAL LOW (ref 3.5–5.1)
Sodium: 138 mmol/L (ref 135–145)

## 2016-06-20 LAB — CBC WITH DIFFERENTIAL/PLATELET
Basophils Absolute: 0 10*3/uL (ref 0.0–0.1)
Basophils Relative: 0 %
Eosinophils Absolute: 0.1 10*3/uL (ref 0.0–0.7)
Eosinophils Relative: 2 %
HCT: 34.5 % — ABNORMAL LOW (ref 36.0–46.0)
Hemoglobin: 11.6 g/dL — ABNORMAL LOW (ref 12.0–15.0)
Lymphocytes Relative: 43 %
Lymphs Abs: 3.4 10*3/uL (ref 0.7–4.0)
MCH: 27.6 pg (ref 26.0–34.0)
MCHC: 33.6 g/dL (ref 30.0–36.0)
MCV: 81.9 fL (ref 78.0–100.0)
Monocytes Absolute: 0.6 10*3/uL (ref 0.1–1.0)
Monocytes Relative: 7 %
Neutro Abs: 3.8 10*3/uL (ref 1.7–7.7)
Neutrophils Relative %: 48 %
Platelets: 370 10*3/uL (ref 150–400)
RBC: 4.21 MIL/uL (ref 3.87–5.11)
RDW: 15.5 % (ref 11.5–15.5)
WBC: 8 10*3/uL (ref 4.0–10.5)

## 2016-06-20 LAB — BRAIN NATRIURETIC PEPTIDE: B Natriuretic Peptide: 148 pg/mL — ABNORMAL HIGH (ref 0.0–100.0)

## 2016-06-20 LAB — TROPONIN I
Troponin I: <0.03 ng/mL (ref <0.03)
Troponin I: <0.03 ng/mL (ref <0.03)
Troponin I: <0.03 ng/mL (ref <0.03)

## 2016-06-20 MED ORDER — ASPIRIN 81 MG PO CHEW
324.0000 mg | CHEWABLE_TABLET | Freq: Once | ORAL | Status: AC
  Administered 2016-06-20: 324 mg via ORAL
  Filled 2016-06-20: qty 4

## 2016-06-20 MED ORDER — POTASSIUM CHLORIDE CRYS ER 20 MEQ PO TBCR: 40.0000 meq | EXTENDED_RELEASE_TABLET | Freq: Once | ORAL | Status: DC

## 2016-06-20 MED ORDER — RANITIDINE HCL 150 MG PO TABS: 150.0000 mg | ORAL_TABLET | Freq: Two times a day (BID) | ORAL | 0 refills | Status: DC

## 2016-06-20 MED ORDER — NITROGLYCERIN 0.4 MG SL SUBL
0.4000 mg | SUBLINGUAL_TABLET | SUBLINGUAL | Status: DC | PRN
Start: 2016-06-20 — End: 2016-06-20

## 2016-06-20 MED ORDER — MORPHINE SULFATE (PF) 4 MG/ML IV SOLN: 4.0000 mg | Freq: Once | INTRAVENOUS | Status: DC

## 2016-06-20 MED ORDER — POTASSIUM CHLORIDE 10 MEQ/100ML IV SOLN
10.0000 meq | INTRAVENOUS | Status: AC
  Administered 2016-06-20 (×3): 10 meq via INTRAVENOUS
  Filled 2016-06-20 (×2): qty 100

## 2016-06-20 MED ORDER — ENOXAPARIN SODIUM 40 MG/0.4ML ~~LOC~~ SOLN
40.0000 mg | SUBCUTANEOUS | Status: DC
  Administered 2016-06-20: 40 mg via SUBCUTANEOUS
  Filled 2016-06-20: qty 0.4

## 2016-06-20 MED ORDER — CARVEDILOL 3.125 MG PO TABS
3.1250 mg | ORAL_TABLET | Freq: Two times a day (BID) | ORAL | Status: DC
  Administered 2016-06-20: 3.125 mg via ORAL
  Filled 2016-06-20 (×2): qty 1

## 2016-06-20 MED ORDER — LORAZEPAM 1 MG PO TABS: 1.0000 mg | ORAL_TABLET | Freq: Every evening | ORAL | Status: DC | PRN

## 2016-06-20 MED ORDER — ACETAMINOPHEN 500 MG PO TABS: 500.0000 mg | ORAL_TABLET | Freq: Three times a day (TID) | ORAL | Status: DC | PRN

## 2016-06-20 MED ORDER — FENTANYL CITRATE (PF) 100 MCG/2ML IJ SOLN
50.0000 ug | INTRAMUSCULAR | Status: DC | PRN
Start: 2016-06-20 — End: 2016-06-20

## 2016-06-20 MED ORDER — MORPHINE SULFATE (PF) 2 MG/ML IV SOLN
INTRAVENOUS | Status: AC
  Administered 2016-06-20: 4 mg via INTRAVENOUS
  Filled 2016-06-20: qty 2

## 2016-06-20 MED ORDER — FUROSEMIDE 20 MG PO TABS
20.0000 mg | ORAL_TABLET | Freq: Every day | ORAL | Status: DC
  Administered 2016-06-20: 20 mg via ORAL
  Filled 2016-06-20: qty 1

## 2016-06-20 MED ORDER — ONDANSETRON HCL 4 MG/2ML IJ SOLN: 4.0000 mg | Freq: Four times a day (QID) | INTRAMUSCULAR | Status: DC | PRN

## 2016-06-20 MED ORDER — FENTANYL CITRATE (PF) 100 MCG/2ML IJ SOLN
50.0000 ug | Freq: Once | INTRAMUSCULAR | Status: AC
  Administered 2016-06-20: 50 ug via INTRAVENOUS
  Filled 2016-06-20: qty 2

## 2016-06-20 MED ORDER — ASPIRIN EC 81 MG PO TBEC
81.0000 mg | DELAYED_RELEASE_TABLET | Freq: Every day | ORAL | Status: DC
  Administered 2016-06-20: 81 mg via ORAL
  Filled 2016-06-20: qty 1

## 2016-06-20 MED ORDER — ONDANSETRON HCL 4 MG/2ML IJ SOLN
4.0000 mg | Freq: Once | INTRAMUSCULAR | Status: AC
  Administered 2016-06-20: 4 mg via INTRAVENOUS
  Filled 2016-06-20: qty 2

## 2016-06-20 MED ORDER — POTASSIUM CHLORIDE CRYS ER 20 MEQ PO TBCR: 20.0000 meq | EXTENDED_RELEASE_TABLET | Freq: Every day | ORAL | 0 refills | Status: DC

## 2016-06-20 MED ORDER — CLOPIDOGREL BISULFATE 75 MG PO TABS
75.0000 mg | ORAL_TABLET | Freq: Every day | ORAL | Status: DC
  Administered 2016-06-20: 75 mg via ORAL
  Filled 2016-06-20: qty 1

## 2016-06-20 MED ORDER — LOSARTAN POTASSIUM 25 MG PO TABS
12.5000 mg | ORAL_TABLET | Freq: Every evening | ORAL | Status: DC
  Filled 2016-06-20 (×2): qty 0.5

## 2016-06-20 NOTE — Discharge Summary (Signed)
Physician Discharge Summary  Erin [name] [id number] DOB: [birth year] DOA: 06/20/2016  PCP: Jonathon Bellows, MD  Admit date: 06/20/2016 Discharge date: 06/20/2016  Admitted From: home Disposition:  home  Recommendations for Outpatient Follow-up:  1. Follow up with PCP in 1-2 weeks 2. Please obtain BMP/CBC in one week 3. Patient will be referred to GI for evaluation of GERD 4. Follow up with cardiology in 2 weeks as previously scheduled  Home Health: Equipment/Devices:  Discharge Condition: stable CODE STATUS: full Diet recommendation: Heart Healthy  Brief/Interim Summary: Patient was admitted to the hospital with complaints of chest discomfort. The pain appeared to be very atypical. She ruled out for ACS with negative cardiac markers. She did not have any acute EKG changes. On further questioning, patient reports her pain is somewhat worse after eating. She does find some improvement of pain after drinking a glass of milk. Pain does get worse when she lays down to sleep. Suspect that her symptoms are related to GERD. She is never seen a gastroenterologist before. We'll start her on H2 blockers. Will refer to GI in case she needs endoscopy. Since she was noted to be hypokalemic on admission and takes Lasix, will start her on daily potassium supplements. The remainder of her medical problems remain stable. She is scheduled to follow up with cardiology in about 2 weeks.  Discharge Diagnoses:  Active Problems:   Essential hypertension   Hyperlipidemia   GERD (gastroesophageal reflux disease)   Chest pain   CAD (coronary artery disease)   Hypokalemia   Chronic combined systolic and diastolic CHF (congestive heart failure) [facility])    Discharge Instructions  Discharge Instructions    Diet - low sodium heart healthy    Complete by:  As directed    Increase activity slowly    Complete by:  As directed      Allergies as of 06/20/2016   No Known Allergies     Medication List     TAKE these medications   acetaminophen 500 MG tablet Commonly known as:  TYLENOL Take 1 tablet (500 mg total) by mouth every 8 (eight) hours as needed for mild pain, moderate pain or headache.   aspirin EC 81 MG tablet Take 81 mg by mouth daily.   carvedilol 3.125 MG tablet Commonly known as:  COREG Take 1 tablet (3.125 mg total) by mouth 2 (two) times daily with a meal.   clopidogrel 75 MG tablet Commonly known as:  PLAVIX Take 1 tablet (75 mg total) by mouth daily.   furosemide 20 MG tablet Commonly known as:  LASIX Take 1 tablet (20 mg total) by mouth daily.   LORazepam 0.5 MG tablet Commonly known as:  ATIVAN Take 1 tablet (0.5 mg total) by mouth every 12 (twelve) hours as needed for anxiety. DO NOT DRIVE WHEN TAKING THIS MEDICATION. What changed:  how much to take  when to take this  reasons to take this  additional instructions   losartan 25 MG tablet Commonly known as:  COZAAR Take 0.5 tablets (12.5 mg total) by mouth every evening.   nitroGLYCERIN 0.4 MG SL tablet Commonly known as:  NITROSTAT Place 1 tablet (0.4 mg total) under the tongue every 5 (five) minutes x 3 doses as needed for chest pain.   potassium chloride SA 20 MEQ tablet Commonly known as:  K-DUR,KLOR-CON Take 1 tablet (20 mEq total) by mouth daily.   ranitidine 150 MG tablet Commonly known as:  ZANTAC Take 1 tablet (150 mg total)  by mouth 2 (two) times daily.      Follow-up Information    Barney Drain, MD.   Specialty:  Gastroenterology Contact information: [address] [place] [address] [phone]          No Known Allergies  Consultations:     Procedures/Studies: Dg Chest 2 View  Result Date: 06/20/2016 CLINICAL DATA:  Generalized chest pain and shortness of breath. Nausea. EXAM: CHEST  2 VIEW COMPARISON:  05/16/2016 FINDINGS: Pulmonary hyperinflation. Normal heart size and pulmonary vascularity. Coronary artery stents. No focal airspace disease or  consolidation in the lungs. No blunting of costophrenic angles. No pneumothorax. Mediastinal contours appear intact. Interstitial changes seen previously have resolved. Mild degenerative changes in the spine. IMPRESSION: No active cardiopulmonary disease. Electronically Signed   By: Lucienne Capers M.D.   On: 06/20/2016 04:59   Mr Lumbar Spine W Wo Contrast  Result Date: 06/10/2016 CLINICAL DATA:  Low back pain radiating to left leg for 1 month. No known injury. EXAM: MRI LUMBAR SPINE WITHOUT AND WITH CONTRAST TECHNIQUE: Multiplanar and multiecho pulse sequences of the lumbar spine were obtained without and with intravenous contrast. CONTRAST:  10 ml MULTIHANCE GADOBENATE DIMEGLUMINE 529 MG/ML IV SOLN COMPARISON:  None. FINDINGS: Segmentation:  Standard. Alignment:  Normal. Vertebrae:  Height and signal are normal. Conus medullaris: Extends to the L1-2 level and appears normal. Paraspinal and other soft tissues: Negative. Disc levels: T10-11, T11-12 and T12-L1 are imaged in the sagittal plane only and negative. L1-2:  Negative. L2-3:  Negative. L3-4:  Negative. L4-5:  Mild facet degenerative change.  Otherwise negative. L5-S1: Very shallow disc bulge. The central canal and foramina are widely patent. IMPRESSION: Mild spondylosis L4-5 and L5-S1. The central canal and foramina are widely patent at all levels. No finding to explain the patient's symptoms. Electronically Signed   By: Inge Rise M.D.   On: 06/10/2016 15:29   US Arterial Seg Single  Result Date: 06/16/2016 CLINICAL DATA:  Left rest pain. Leg numbness times 30 days, discoloration. Hypertension, previous tobacco abuse. EXAM: NONINVASIVE PHYSIOLOGIC VASCULAR STUDY OF BILATERAL LOWER EXTREMITIES TECHNIQUE: Evaluation of both lower extremities were performed at rest, including calculation of ankle-brachial indices with single level Doppler, pressure and pulse volume recording. COMPARISON:  None. FINDINGS: Right ABI:  1.03 Left ABI:  1.03 Right  Lower Extremity: Biphasic waveform in the distal posterior tibial artery. Left Lower Extremity:  Triphasic waveforms distally. IMPRESSION: No evidence of hemodynamically significant lower extremity arterial occlusive disease at rest. Electronically Signed   By: Lucrezia Europe M.D.   On: 06/16/2016 17:12      Subjective: Still has some soreness in chest. No shortness of breath.  Discharge Exam: Vitals:   06/20/16 0510 06/20/16 0530  BP: 102/74 119/78  Pulse: 73 79  Resp: 20 16  Temp:     Vitals:   06/20/16 0401 06/20/16 0430 06/20/16 0510 06/20/16 0530  BP:  104/80 102/74 119/78  Pulse:  81 73 79  Resp:  18 20 16   Temp:      TempSrc:      SpO2:  97% 97% 99%  Weight: 50.8 kg (112 lb)     Height: 5' (1.524 m)       General: Pt is alert, awake, not in acute distress Cardiovascular: RRR, S1/S2 +, no rubs, no gallops Respiratory: CTA bilaterally, no wheezing, no rhonchi Abdominal: Soft, NT, ND, bowel sounds + Extremities: no edema, no cyanosis    The results of significant diagnostics from this hospitalization (including  imaging, microbiology, ancillary and laboratory) are listed below for reference.     Microbiology: No results found for this or any previous visit (from the past 240 hour(s)).   Labs: BNP (last 3 results)  Recent Labs  03/17/16 1822 05/16/16 0317 06/20/16 0408  BNP 221.0* 783.0* 0000000*   Basic Metabolic Panel:  Recent Labs Lab 06/20/16 0408  NA 138  K 3.0*  CL 105  CO2 24  GLUCOSE 107*  BUN 15  CREATININE 0.76  CALCIUM 8.9   Liver Function Tests: No results for input(s): AST, ALT, ALKPHOS, BILITOT, PROT, ALBUMIN in the last 168 hours. No results for input(s): LIPASE, AMYLASE in the last 168 hours. No results for input(s): AMMONIA in the last 168 hours. CBC:  Recent Labs Lab 06/20/16 0408  WBC 8.0  NEUTROABS 3.8  HGB 11.6*  HCT 34.5*  MCV 81.9  PLT 370   Cardiac Enzymes:  Recent Labs Lab 06/20/16 0408 06/20/16 0722  06/20/16 1316  TROPONINI <0.03 <0.03 <0.03   BNP: Invalid input(s): POCBNP CBG: No results for input(s): GLUCAP in the last 168 hours. D-Dimer No results for input(s): DDIMER in the last 72 hours. Hgb A1c No results for input(s): HGBA1C in the last 72 hours. Lipid Profile No results for input(s): CHOL, HDL, LDLCALC, TRIG, CHOLHDL, LDLDIRECT in the last 72 hours. Thyroid function studies No results for input(s): TSH, T4TOTAL, T3FREE, THYROIDAB in the last 72 hours.  Invalid input(s): FREET3 Anemia work up No results for input(s): VITAMINB12, FOLATE, FERRITIN, TIBC, IRON, RETICCTPCT in the last 72 hours. Urinalysis    Component Value Date/Time   COLORURINE YELLOW 03/17/2016 2036   APPEARANCEUR CLEAR 03/17/2016 2036   LABSPEC >1.030 (H) 03/17/2016 2036   PHURINE 5.5 03/17/2016 2036   GLUCOSEU NEGATIVE 03/17/2016 2036   HGBUR NEGATIVE 03/17/2016 2036   BILIRUBINUR NEGATIVE 03/17/2016 2036   KETONESUR TRACE (A) 03/17/2016 2036   PROTEINUR NEGATIVE 03/17/2016 2036   NITRITE NEGATIVE 03/17/2016 2036   LEUKOCYTESUR NEGATIVE 03/17/2016 2036   Sepsis Labs Invalid input(s): PROCALCITONIN,  WBC,  LACTICIDVEN Microbiology No results found for this or any previous visit (from the past 240 hour(s)).   Time coordinating discharge: Over 30 minutes  SIGNED:   Kathie Dike, MD  [facility] 06/20/2016, 4:09 PM Pager   If 7PM-7AM, please contact night-coverage [email or web address] Password [email or web address]

## 2016-06-20 NOTE — ED Triage Notes (Signed)
Pt states she started having generalized chest pain that started around 2330 with SOB.  Pain nonradiating.  Pt also c/o nausea

## 2016-06-20 NOTE — H&P (Signed)
TRH H&P    Patient Demographics:    Erin [name], is a 56 y.o. female  MRN: [id number]  DOB - 1960-10-18  Admit Date - 06/20/2016  Referring MD/NP/PA: Dr. Leonides Schanz  Outpatient Primary MD for the patient is WEBB, Valla Leaver, MD  Patient coming from: Home  Chief Complaint  Patient presents with  . Chest Pain      HPI:    Erin [name]  is a 56 y.o. female, With history of CAD status post drug-eluting stent to right coronary artery and PDA 2, ischemic cardiac myopathy. EF 35-40%, GERD came to hospital with chest pain which started last night. Patient denies shortness of breath. No nausea or vomiting. In the ED clinic enzymes 1 are negative She complains of abdominal discomfort from GERD, no dysuria urgency frequency of urination.   Review of systems:    In addition to the HPI above,  No Fever-chills, No Headache, No changes with Vision or hearing, No problems swallowing food or Liquids, No Blood in stool or Urine, No dysuria, No new skin rashes or bruises, No new joints pains-aches,  No new weakness, tingling, numbness in any extremity, No recent weight gain or loss, No polyuria, polydypsia or polyphagia, No significant Mental Stressors.  A full 10 point Review of Systems was done, except as stated above, all other Review of Systems were negative.   With Past History of the following :    Past Medical History:  Diagnosis Date  . Anxiety state 03/25/2016  . Basal cell carcinoma of forehead   . CHF (congestive heart failure) ([place])   . Coronary artery disease    a. 03/11/16 PCI with DES-->Prox/Mid Cx, 03/14/16 PCI with DES x2-->RCA, EF 30-35%  . Essential hypertension   . GERD (gastroesophageal reflux disease)   . History of pneumonia   . Hyperlipidemia   . Ischemic cardiomyopathy   . Mitral regurgitation   . NSTEMI (non-ST elevated myocardial infarction) ([place]) 03/10/2016  . Squamous cell  cancer of skin of nose   . Thrombocytosis ([place]) 03/26/2016  . Wears dentures   . Wears glasses       Past Surgical History:  Procedure Laterality Date  . APPENDECTOMY    . CARDIAC CATHETERIZATION N/A 03/11/2016   Procedure: Left Heart Cath and Coronary Angiography;  Surgeon: Leonie Man, MD;  Location: [facility];  Service: Cardiovascular;  Laterality: N/A;  . CARDIAC CATHETERIZATION N/A 03/11/2016   Procedure: Coronary Stent Intervention;  Surgeon: Leonie Man, MD;  Location: [facility];  Service: Cardiovascular;  Laterality: N/A;  . CARDIAC CATHETERIZATION N/A 03/14/2016   Procedure: Coronary Stent Intervention;  Surgeon: Peter M Martinique, MD;  Location: [facility];  Service: Cardiovascular;  Laterality: N/A;  . CHOLECYSTECTOMY OPEN  1984  . CORONARY ANGIOPLASTY WITH STENT PLACEMENT  03/14/2016  . FINGER ARTHROPLASTY Left 05/14/2013   Procedure: LEFT THUMB CARPAL METACARPAL ARTHROPLASTY;  Surgeon: Tennis Must, MD;  Location: [facility];  Service: Orthopedics;  Laterality: Left;  . TUBAL LIGATION  1987  .  VAGINAL HYSTERECTOMY  2009      Social History:      Social History  Substance Use Topics  . Smoking status: Former Smoker    Packs/day: 1.00    Years: 15.00    Types: Cigarettes  . Smokeless tobacco: Never Used     Comment: quit 2 weeks ago-03/24/16  . Alcohol use 3.6 oz/week    6 Cans of beer per week     Comment: occ       Family History :     Family History  Problem Relation Age of Onset  . Stroke Mother   . Hypertension Mother   . Diabetes Mother   . Heart failure Other       Home Medications:   Prior to Admission medications   Medication Sig Start Date End Date Taking? Authorizing Provider  acetaminophen (TYLENOL) 500 MG tablet Take 1 tablet (500 mg total) by mouth every 8 (eight) hours as needed for mild pain, moderate pain or headache. 03/15/16   Cheryln Manly, NP  aspirin EC 81 MG tablet Take 81 mg by  mouth daily.    Historical Provider, MD  carvedilol (COREG) 3.125 MG tablet Take 1 tablet (3.125 mg total) by mouth 2 (two) times daily with a meal. 03/26/16   Rexene Alberts, MD  clopidogrel (PLAVIX) 75 MG tablet Take 1 tablet (75 mg total) by mouth daily. 06/02/16   Lendon Colonel, NP  furosemide (LASIX) 20 MG tablet Take 1 tablet (20 mg total) by mouth daily. 06/13/16   Josue Hector, MD  LORazepam (ATIVAN) 0.5 MG tablet Take 1 tablet (0.5 mg total) by mouth every 12 (twelve) hours as needed for anxiety. DO NOT DRIVE WHEN TAKING THIS MEDICATION. Patient taking differently: Take 1 mg by mouth at bedtime as needed for anxiety or sleep. DO NOT DRIVE WHEN TAKING THIS MEDICATION. 03/26/16   Rexene Alberts, MD  losartan (COZAAR) 25 MG tablet Take 0.5 tablets (12.5 mg total) by mouth every evening. 05/20/16   Thurnell Lose, MD  nitroGLYCERIN (NITROSTAT) 0.4 MG SL tablet Place 1 tablet (0.4 mg total) under the tongue every 5 (five) minutes x 3 doses as needed for chest pain. 09/06/15   Almyra Deforest, PA     Allergies:    No Known Allergies   Physical Exam:   Vitals  Blood pressure 119/78, pulse 79, temperature 97.7 F (36.5 C), temperature source Oral, resp. rate 16, height 5' (1.524 m), weight 50.8 kg (112 lb), SpO2 99 %.  1.  General: Appears in no acute distress  2. Psychiatric:  Intact judgement and  insight, awake alert, oriented x 3.  3. Neurologic: No focal neurological deficits, all cranial nerves intact.Strength 5/5 all 4 extremities, sensation intact all 4 extremities, plantars down going.  4. Eyes :  anicteric sclerae, moist conjunctivae with no lid lag. PERRLA.  5. ENMT:  Oropharynx clear with moist mucous membranes and good dentition  6. Neck:  supple, no cervical lymphadenopathy appriciated, No thyromegaly  7. Respiratory : Normal respiratory effort, good air movement bilaterally,clear to  auscultation bilaterally  8. Cardiovascular : RRR, no gallops, rubs or murmurs,  no leg edema  9. Gastrointestinal:  Positive bowel sounds, abdomen soft, non-tender to palpation,no hepatosplenomegaly, no rigidity or guarding       10. Skin:  No cyanosis, normal texture and turgor, no rash, lesions or ulcers  11.Musculoskeletal:  Good muscle tone,  joints appear normal , no effusions,  normal range of motion  Data Review:    CBC  Recent Labs Lab 06/20/16 0408  WBC 8.0  HGB 11.6*  HCT 34.5*  PLT 370  MCV 81.9  MCH 27.6  MCHC 33.6  RDW 15.5  LYMPHSABS 3.4  MONOABS 0.6  EOSABS 0.1  BASOSABS 0.0   ------------------------------------------------------------------------------------------------------------------  Chemistries   Recent Labs Lab 06/20/16 0408  NA 138  K 3.0*  CL 105  CO2 24  GLUCOSE 107*  BUN 15  CREATININE 0.76  CALCIUM 8.9   ------------------------------------------------------------------------------------------------------------------  ----------------------------------------------------------------Cardiac Enzymes:  Recent Labs Lab 06/20/16 0408  TROPONINI <0.03   BNP (last 3 results) No results for input(s): PROBNP in the last 8760 hours. HbA1C: No results for input(s): HGBA1C in the last 72 hours. CBG: No results for input(s): GLUCAP in the last 168 hours. Lipid Profile: No results for input(s): CHOL, HDL, LDLCALC, TRIG, CHOLHDL, LDLDIRECT in the last 72 hours. Thyroid Function Tests: No results for input(s): TSH, T4TOTAL, FREET4, T3FREE, THYROIDAB in the last 72 hours. Anemia Panel: No results for input(s): VITAMINB12, FOLATE, FERRITIN, TIBC, IRON, RETICCTPCT in the last 72 hours.  --------------------------------------------------------------------------------------------------------------- Urine analysis:    Component Value Date/Time   COLORURINE YELLOW 03/17/2016 2036   APPEARANCEUR CLEAR 03/17/2016 2036   LABSPEC >1.030 (H) 03/17/2016 2036   PHURINE 5.5 03/17/2016 2036   GLUCOSEU NEGATIVE  03/17/2016 2036   HGBUR NEGATIVE 03/17/2016 2036   BILIRUBINUR NEGATIVE 03/17/2016 2036   KETONESUR TRACE (A) 03/17/2016 2036   PROTEINUR NEGATIVE 03/17/2016 2036   NITRITE NEGATIVE 03/17/2016 2036   LEUKOCYTESUR NEGATIVE 03/17/2016 2036      Imaging Results:    Dg Chest 2 View  Result Date: 06/20/2016 CLINICAL DATA:  Generalized chest pain and shortness of breath. Nausea. EXAM: CHEST  2 VIEW COMPARISON:  05/16/2016 FINDINGS: Pulmonary hyperinflation. Normal heart size and pulmonary vascularity. Coronary artery stents. No focal airspace disease or consolidation in the lungs. No blunting of costophrenic angles. No pneumothorax. Mediastinal contours appear intact. Interstitial changes seen previously have resolved. Mild degenerative changes in the spine. IMPRESSION: No active cardiopulmonary disease. Electronically Signed   By: Lucienne Capers M.D.   On: 06/20/2016 04:59    My personal review of EKG: Rhythm NSR   Assessment & Plan:    Active Problems:   Chest pain   1. Chest pain- patient has significant history of CAD, will place under observation. Cycle troponin every 6 hours 3. Continue fentanyl 50 g every 2 hours when necessary. 2. Hypokalemia-replace potassium and check BMP in a.m. 3. Coronary artery disease-continue Coreg, Plavix, aspirin, nitroglycerin when necessary 4. Chronic systolic CHF- appears well compensated, continue Lasix, Cozaar.   DVT Prophylaxis-   Lovenox   AM Labs Ordered, also please review Full Orders  Family Communication: Admission, patients condition and plan of care including tests being ordered have been discussed with the patient and Her husband at bedside who indicate understanding and agree with the plan and Code Status.  Code Status:  Full code  Admission status: Observation    Time spent in minutes : 60 minutes   Sheba Whaling S M.D on 06/20/2016 at 5:56 AM  Between 7am to 7pm - Pager - [phone]. After 7pm go to [email or web address] - password  [facility]  [facility] - Office  [phone]

## 2016-06-20 NOTE — ED Notes (Signed)
Pt in x-ray at this time

## 2016-06-20 NOTE — ED Provider Notes (Signed)
TIME SEEN: 4:00 AM  CHIEF COMPLAINT: Chest pain  HPI: Pt is a 56 y.o. female with history of coronary artery disease status post stents who presents to the emergency department with complaints of chest pain shortness of breath that started around 11:30 PM last night while in bed. Described as a central pressure without radiation with associated shortness of breath and nausea. No diaphoresis or dizziness. States that she felt like she was getting short of breath earlier yesterday and took an extra Lasix. States she took 3 nitroglycerin at home for her pain without much relief. States her symptoms feel similar to her prior myocardial infarction. She did have a cardiac catheterization in November and had 2 drug-eluting stents placed to the RCA, PDA. Recently had an echocardiogram which showed an EF of 35-40%. Has had a history of flash pulmonary edema. Denies fever or cough. No history of PE or DVT.    Cath 03/14/16:   Mid Cx lesion, 0 %stenosed.  A drug eluting .  A STENT PROMUS PREM MR 2.25X16 drug eluting stent was successfully placed.  A STENT PROMUS PREM MR 2.25X8 drug-eluting stent was successfully placed.  Dist RCA lesion, 60 %stenosed.  Post intervention, there is a 0% residual stenosis.  Post Atrio lesion, 0 %stenosed.  Post intervention, there is a 0% residual stenosis.  A stent was successfully placed.   1. Successful Stenting of the distal RCA/PDA with DES x 2.   Echo 05/16/16:  Study Conclusions  - Left ventricle: The cavity size was normal. Wall thickness was   increased in a pattern of mild LVH. Systolic function was   moderately reduced. The estimated ejection fraction was in the   range of 35% to 40%. There is akinesis of the   basal-midinferolateral, inferior, and inferoseptal myocardium.   Doppler parameters are consistent with abnormal left ventricular   relaxation (grade 1 diastolic dysfunction). - Aortic valve: Mildly calcified annulus. Trileaflet. -  Mitral valve: There was mild regurgitation. - Tricuspid valve: There was trivial regurgitation. - Pulmonary arteries: PA peak pressure: 22 mm Hg (S). - Pericardium, extracardiac: A small to moderate pericardial   effusion was identified circumferential to the heart. No obvious   right ventricular compromise. Mitral inflow with respiration does   not suggest definitive tamponade physiology.  ROS: See HPI Constitutional: no fever  Eyes: no drainage  ENT: no runny nose   Cardiovascular:   chest pain  Resp:  SOB  GI: no vomiting GU: no dysuria Integumentary: no rash  Allergy: no hives  Musculoskeletal: no leg swelling  Neurological: no slurred speech ROS otherwise negative  PAST MEDICAL HISTORY/PAST SURGICAL HISTORY:  Past Medical History:  Diagnosis Date  . Anxiety state 03/25/2016  . Basal cell carcinoma of forehead   . Coronary artery disease    a. 03/11/16 PCI with DES-->Prox/Mid Cx, 03/14/16 PCI with DES x2-->RCA, EF 30-35%  . Essential hypertension   . GERD (gastroesophageal reflux disease)   . History of pneumonia   . Hyperlipidemia   . Ischemic cardiomyopathy   . Mitral regurgitation   . NSTEMI (non-ST elevated myocardial infarction) ([place]) 03/10/2016  . Squamous cell cancer of skin of nose   . Thrombocytosis ([place]) 03/26/2016  . Wears dentures   . Wears glasses     MEDICATIONS:  Prior to Admission medications   Medication Sig Start Date End Date Taking? Authorizing Provider  acetaminophen (TYLENOL) 500 MG tablet Take 1 tablet (500 mg total) by mouth every 8 (eight) hours as needed for  mild pain, moderate pain or headache. 03/15/16   Cheryln Manly, NP  aspirin EC 81 MG tablet Take 81 mg by mouth daily.    Historical Provider, MD  carvedilol (COREG) 3.125 MG tablet Take 1 tablet (3.125 mg total) by mouth 2 (two) times daily with a meal. 03/26/16   Rexene Alberts, MD  clopidogrel (PLAVIX) 75 MG tablet Take 1 tablet (75 mg total) by mouth daily. 06/02/16   Lendon Colonel, NP  furosemide (LASIX) 20 MG tablet Take 1 tablet (20 mg total) by mouth daily. 06/13/16   Josue Hector, MD  LORazepam (ATIVAN) 0.5 MG tablet Take 1 tablet (0.5 mg total) by mouth every 12 (twelve) hours as needed for anxiety. DO NOT DRIVE WHEN TAKING THIS MEDICATION. Patient taking differently: Take 1 mg by mouth at bedtime as needed for anxiety or sleep. DO NOT DRIVE WHEN TAKING THIS MEDICATION. 03/26/16   Rexene Alberts, MD  losartan (COZAAR) 25 MG tablet Take 0.5 tablets (12.5 mg total) by mouth every evening. 05/20/16   Thurnell Lose, MD  nitroGLYCERIN (NITROSTAT) 0.4 MG SL tablet Place 1 tablet (0.4 mg total) under the tongue every 5 (five) minutes x 3 doses as needed for chest pain. 09/06/15   Almyra Deforest, PA    ALLERGIES:  No Known Allergies  SOCIAL HISTORY:  Social History  Substance Use Topics  . Smoking status: Former Smoker    Packs/day: 1.00    Years: 15.00    Types: Cigarettes  . Smokeless tobacco: Never Used     Comment: quit 2 weeks ago-03/24/16  . Alcohol use 3.6 oz/week    6 Cans of beer per week     Comment: occ    FAMILY HISTORY: Family History  Problem Relation Age of Onset  . Stroke Mother   . Hypertension Mother   . Diabetes Mother   . Heart failure Other     EXAM: BP 121/84 (BP Location: Left Arm)   Pulse 91   Temp 97.7 F (36.5 C) (Oral)   Resp 16   Ht 5' (1.524 m)   Wt 112 lb (50.8 kg)   SpO2 100%   BMI 21.87 kg/m  CONSTITUTIONAL: Alert and oriented and responds appropriately to questions. Well-appearing; well-nourished HEAD: Normocephalic EYES: Conjunctivae clear, PERRL, EOMI ENT: normal nose; no rhinorrhea; moist mucous membranes; no JVD NECK: Supple, no meningismus, no nuchal rigidity, no LAD  CARD: RRR; S1 and S2 appreciated; no murmurs, no clicks, no rubs, no gallops RESP: Normal chest excursion without splinting or tachypnea; breath sounds clear and equal bilaterally; no wheezes, no rhonchi, no rales, no hypoxia or respiratory  distress, speaking full sentences ABD/GI: Normal bowel sounds; non-distended; soft, non-tender, no rebound, no guarding, no peritoneal signs, no hepatosplenomegaly BACK:  The back appears normal and is non-tender to palpation, there is no CVA tenderness EXT: Normal ROM in all joints; non-tender to palpation; no edema; normal capillary refill; no cyanosis, no calf tenderness or swelling    SKIN: Normal color for age and race; warm; no rash NEURO: Moves all extremities equally, sensation to light touch intact diffusely, cranial nerves II through XII intact, normal speech PSYCH: The patient's mood and manner are appropriate. Grooming and personal hygiene are appropriate.  MEDICAL DECISION MAKING: Patient here with chest pain. States it feels similar to her prior MI. Does not appear volume overloaded on exam. He is hemodynamically stable. EKG shows no new ischemic abnormality. It does show improvement of T-wave inversions in V6 compared  to previous. We'll obtain cardiac labs. Will give aspirin, morphine for pain.  ED PROGRESS: Patient's labs unremarkable. Troponin negative. Chest x-ray clear. Patient still having some pain after morphine. We'll give dose of IV fentanyl given her blood pressure has dropped. I do not feel she needs IV hydration at this time given history of flash pulmonary edema. Doubt PE or dissection. Will admit for chest pain rule out. Discussed with Dr. Darrick Meigs with hospitalist service who agrees. He will place holding orders. Patient and family have been updated with this plan.   I reviewed all nursing notes, vitals, pertinent old records, EKGs, labs, imaging (as available).      EKG Interpretation  Date/Time:  Monday June 20 2016 03:58:29 EST Ventricular Rate:  86 PR Interval:    QRS Duration: 91 QT Interval:  365 QTC Calculation: 437 R Axis:   16 Text Interpretation:  Sinus rhythm Probable left atrial enlargement No significant change since last tracing Confirmed by  WARD,  DO, KRISTEN [id number]) on 06/20/2016 4:01:08 AM         [place], DO 06/20/16 [id number]

## 2016-06-22 ENCOUNTER — Encounter (HOSPITAL_COMMUNITY)
Admission: RE | Admit: 2016-06-22 | Discharge: 2016-06-22 | Disposition: A | Discharge status: Home / Self Care | Payer: PRIVATE HEALTH INSURANCE | Source: Ambulatory Visit | Attending: Cardiology | Admitting: Cardiology

## 2016-06-22 DIAGNOSIS — Z955 Presence of coronary angioplasty implant and graft: Secondary | ICD-10-CM | POA: Diagnosis not present

## 2016-06-22 DIAGNOSIS — I214 Non-ST elevation (NSTEMI) myocardial infarction: Secondary | ICD-10-CM

## 2016-06-22 NOTE — Progress Notes (Signed)
Daily Session Note  Patient Details  Name: [name] MRN: [id number] Date of Birth: 03-06-1961 Referring Provider:   Flowsheet Row CARDIAC REHAB PHASE II ORIENTATION from 04/19/2016 in [place]  Referring Provider  Dr. Radford Pax      Encounter Date: 06/22/2016  Check In:     Session Check In - 06/22/16 1542      Check-In   Location [facility]   Staff Present Aundra Dubin, RN, BSN;Nayelis Bonito Luther Parody, BS, EP, Exercise Physiologist   Supervising physician immediately available to respond to emergencies See telemetry face sheet for immediately available MD   Medication changes reported     No   Fall or balance concerns reported    No   Warm-up and Cool-down Performed as group-led instruction   Resistance Training Performed Yes   VAD Patient? No     Pain Assessment   Currently in Pain? No/denies   Pain Score 0-No pain   Multiple Pain Sites No      Capillary Blood Glucose: No results found for this or any previous visit (from the past 24 hour(s)).   Goals Met:  Independence with exercise equipment Exercise tolerated well No report of cardiac concerns or symptoms Strength training completed today  Goals Unmet:  Not Applicable  Comments: Check out 445   Dr. Kate Sable is Medical Director for [place] and Pulmonary Rehab.

## 2016-06-24 ENCOUNTER — Encounter (HOSPITAL_COMMUNITY)
Admission: RE | Admit: 2016-06-24 | Discharge: 2016-06-24 | Disposition: A | Discharge status: Home / Self Care | Payer: PRIVATE HEALTH INSURANCE | Source: Ambulatory Visit | Attending: Cardiology | Admitting: Cardiology

## 2016-06-24 DIAGNOSIS — I214 Non-ST elevation (NSTEMI) myocardial infarction: Secondary | ICD-10-CM

## 2016-06-24 DIAGNOSIS — Z955 Presence of coronary angioplasty implant and graft: Secondary | ICD-10-CM | POA: Diagnosis not present

## 2016-06-24 NOTE — Progress Notes (Signed)
Daily Session Note  Patient Details  Name: KRISTYNA BRADSTREET MRN: [id number] Date of Birth: 1960/05/15 Referring Provider:   Flowsheet Row CARDIAC REHAB PHASE II ORIENTATION from 04/19/2016 in [place]  Referring Provider  Dr. Radford Pax      Encounter Date: 06/24/2016  Check In:     Session Check In - 06/24/16 1538      Check-In   Location [facility]   Staff Present Aundra Dubin, RN, BSN;Nivan Melendrez Luther Parody, BS, EP, Exercise Physiologist   Supervising physician immediately available to respond to emergencies See telemetry face sheet for immediately available MD   Medication changes reported     No   Fall or balance concerns reported    No   Warm-up and Cool-down Performed as group-led instruction   Resistance Training Performed Yes   VAD Patient? No     Pain Assessment   Currently in Pain? No/denies   Pain Score 0-No pain   Multiple Pain Sites No      Capillary Blood Glucose: No results found for this or any previous visit (from the past 24 hour(s)).   Goals Met:  Independence with exercise equipment Exercise tolerated well No report of cardiac concerns or symptoms Strength training completed today  Goals Unmet:  Not Applicable  Comments: Check out 445   Dr. Kate Sable is Medical Director for [facility] and Pulmonary Rehab.

## 2016-06-27 ENCOUNTER — Encounter (HOSPITAL_COMMUNITY)
Admission: RE | Admit: 2016-06-27 | Discharge: 2016-06-27 | Disposition: A | Discharge status: Home / Self Care | Payer: PRIVATE HEALTH INSURANCE | Source: Ambulatory Visit | Attending: Cardiology | Admitting: Cardiology

## 2016-06-27 DIAGNOSIS — I214 Non-ST elevation (NSTEMI) myocardial infarction: Secondary | ICD-10-CM

## 2016-06-27 DIAGNOSIS — Z955 Presence of coronary angioplasty implant and graft: Secondary | ICD-10-CM | POA: Diagnosis not present

## 2016-06-27 NOTE — Progress Notes (Signed)
Daily Session Note  Patient Details  Name: Erin [name] MRN: [id number] Date of Birth: 1961-02-23 Referring Provider:   Flowsheet Row CARDIAC REHAB PHASE II ORIENTATION from 04/19/2016 in [place]  Referring Provider  Dr. Radford Pax      Encounter Date: 06/27/2016  Check In:     Session Check In - 06/27/16 1545      Check-In   Location [facility]   Staff Present Diane Angelina Pih, MS, EP, [facility], Exercise Physiologist;Brenon Antosh Wynetta Emery, RN, BSN   Supervising physician immediately available to respond to emergencies See telemetry face sheet for immediately available MD   Medication changes reported     No   Fall or balance concerns reported    No   Warm-up and Cool-down Performed as group-led instruction   Resistance Training Performed Yes   VAD Patient? No     Pain Assessment   Currently in Pain? No/denies   Pain Score 0-No pain   Multiple Pain Sites No      Capillary Blood Glucose: No results found for this or any previous visit (from the past 24 hour(s)).   Goals Met:  Independence with exercise equipment Exercise tolerated well No report of cardiac concerns or symptoms Strength training completed today  Goals Unmet:  Not Applicable  Comments: Check out 1645.   Dr. Kate Sable is Medical Director for [facility] and Pulmonary Rehab.

## 2016-06-29 ENCOUNTER — Encounter (HOSPITAL_COMMUNITY): Admission: RE | Admit: 2016-06-29 | Payer: PRIVATE HEALTH INSURANCE | Source: Ambulatory Visit

## 2016-07-01 ENCOUNTER — Encounter (HOSPITAL_COMMUNITY): Payer: PRIVATE HEALTH INSURANCE

## 2016-07-04 ENCOUNTER — Encounter (HOSPITAL_COMMUNITY)
Admission: RE | Admit: 2016-07-04 | Discharge: 2016-07-04 | Disposition: A | Discharge status: Home / Self Care | Payer: PRIVATE HEALTH INSURANCE | Source: Ambulatory Visit | Attending: Cardiology | Admitting: Cardiology

## 2016-07-04 DIAGNOSIS — Z955 Presence of coronary angioplasty implant and graft: Secondary | ICD-10-CM | POA: Diagnosis not present

## 2016-07-04 DIAGNOSIS — I214 Non-ST elevation (NSTEMI) myocardial infarction: Secondary | ICD-10-CM

## 2016-07-04 NOTE — Progress Notes (Signed)
Daily Session Note  Patient Details  Name: Erin [name] MRN: [id number] Date of Birth: 10/14/1960 Referring Provider:   Flowsheet Row CARDIAC REHAB PHASE II ORIENTATION from 04/19/2016 in [place]  Referring Provider  Dr. Radford Pax      Encounter Date: 07/04/2016  Check In:     Session Check In - 07/04/16 1545      Check-In   Location [facility]   Staff Present Diane Angelina Pih, MS, EP, [facility], Exercise Physiologist;Lorenia Hoston Wynetta Emery, RN, BSN   Supervising physician immediately available to respond to emergencies See telemetry face sheet for immediately available MD   Fall or balance concerns reported    No   Tobacco Cessation Use Decreased   Warm-up and Cool-down Performed as group-led instruction   Resistance Training Performed Yes   VAD Patient? No     Pain Assessment   Currently in Pain? No/denies   Pain Score 0-No pain   Multiple Pain Sites No      Capillary Blood Glucose: No results found for this or any previous visit (from the past 24 hour(s)).    History  Smoking Status  . Former Smoker  . Packs/day: 1.00  . Years: 15.00  . Types: Cigarettes  Smokeless Tobacco  . Never Used    Comment: quit 2 weeks ago-03/24/16    Goals Met:  Independence with exercise equipment Exercise tolerated well No report of cardiac concerns or symptoms Strength training completed today  Goals Unmet:  Not Applicable  Comments: Check out 1645.   Dr. Kate Sable is Medical Director for [facility] Cardiac and Pulmonary Rehab.

## 2016-07-05 ENCOUNTER — Emergency Department (HOSPITAL_COMMUNITY): Payer: PRIVATE HEALTH INSURANCE

## 2016-07-05 ENCOUNTER — Emergency Department (HOSPITAL_COMMUNITY)
Admission: EM | Admit: 2016-07-05 | Discharge: 2016-07-05 | Disposition: A | Discharge status: Home / Self Care | Payer: PRIVATE HEALTH INSURANCE | Attending: Emergency Medicine | Admitting: Emergency Medicine

## 2016-07-05 ENCOUNTER — Encounter (HOSPITAL_COMMUNITY): Payer: Self-pay | Admitting: *Deleted

## 2016-07-05 DIAGNOSIS — Z7982 Long term (current) use of aspirin: Secondary | ICD-10-CM | POA: Insufficient documentation

## 2016-07-05 DIAGNOSIS — Z87891 Personal history of nicotine dependence: Secondary | ICD-10-CM | POA: Diagnosis not present

## 2016-07-05 DIAGNOSIS — Z85828 Personal history of other malignant neoplasm of skin: Secondary | ICD-10-CM | POA: Insufficient documentation

## 2016-07-05 DIAGNOSIS — I5042 Chronic combined systolic (congestive) and diastolic (congestive) heart failure: Secondary | ICD-10-CM | POA: Diagnosis not present

## 2016-07-05 DIAGNOSIS — F1290 Cannabis use, unspecified, uncomplicated: Secondary | ICD-10-CM | POA: Diagnosis not present

## 2016-07-05 DIAGNOSIS — I110 Hypertensive heart disease with heart failure: Secondary | ICD-10-CM | POA: Diagnosis not present

## 2016-07-05 DIAGNOSIS — R079 Chest pain, unspecified: Secondary | ICD-10-CM

## 2016-07-05 DIAGNOSIS — R0789 Other chest pain: Secondary | ICD-10-CM | POA: Insufficient documentation

## 2016-07-05 DIAGNOSIS — Z79899 Other long term (current) drug therapy: Secondary | ICD-10-CM | POA: Insufficient documentation

## 2016-07-05 LAB — I-STAT TROPONIN, ED: Troponin i, poc: 0 ng/mL (ref 0.00–0.08)

## 2016-07-05 LAB — BASIC METABOLIC PANEL
Anion gap: 6 (ref 5–15)
BUN: 17 mg/dL (ref 6–20)
CO2: 26 mmol/L (ref 22–32)
Calcium: 9.8 mg/dL (ref 8.9–10.3)
Chloride: 107 mmol/L (ref 101–111)
Creatinine, Ser: 0.86 mg/dL (ref 0.44–1.00)
GFR calc Af Amer: >60 mL/min (ref >60)
GFR calc non Af Amer: >60 mL/min (ref >60)
Glucose, Bld: 90 mg/dL (ref 65–99)
Potassium: 4.2 mmol/L (ref 3.5–5.1)
Sodium: 139 mmol/L (ref 135–145)

## 2016-07-05 LAB — CBC
HCT: 36 % (ref 36.0–46.0)
Hemoglobin: 12 g/dL (ref 12.0–15.0)
MCH: 27.5 pg (ref 26.0–34.0)
MCHC: 33.3 g/dL (ref 30.0–36.0)
MCV: 82.4 fL (ref 78.0–100.0)
Platelets: 378 10*3/uL (ref 150–400)
RBC: 4.37 MIL/uL (ref 3.87–5.11)
RDW: 16.1 % — ABNORMAL HIGH (ref 11.5–15.5)
WBC: 8.2 10*3/uL (ref 4.0–10.5)

## 2016-07-05 LAB — TROPONIN I: Troponin I: <0.03 ng/mL (ref <0.03)

## 2016-07-05 NOTE — ED Provider Notes (Signed)
Emergency Department Provider Note   I have reviewed the triage vital signs and the nursing notes.   HISTORY  Chief Complaint Chest Pain   HPI [name] is a 56 y.o. female with PMH of anxiety, CHF, NSTEMI 03/2016 with DES placement x 2, and HLD presents to the emergency department for evaluation of intermittent chest pressure and low oxygen saturation recorded at work today. Patient states that she frequently has this same chest pressure and notes it is present mostly at work. It is made worse with movement. On the weekends when she misses work she denies having symptoms. She had some mild pressure yesterday that resolved in the evening and then returned when she went back to work today. She did cardiac rehabilitation yesterday with no pressure sensation while exercising. She states that she does have to stop sometimes because of elevated heart rate. Today at work she was having chest pressure and they checked her vital signs and record an oxygen saturation of 84% on room air. No known history of COPD. The patient was not having dyspnea at the time. She feels that her chest pressure is resolving without any acute interventions. Notes that this does not feel that same as chest pain during her recent hospitalization and also feels unlike pain that she had in November with her NSTEMI and stent placement.    Past Medical History:  Diagnosis Date  . Anxiety state 03/25/2016  . Basal cell carcinoma of forehead   . CHF (congestive heart failure) ([place])   . Coronary artery disease    a. 03/11/16 PCI with DES-->Prox/Mid Cx, 03/14/16 PCI with DES x2-->RCA, EF 30-35%  . Essential hypertension   . GERD (gastroesophageal reflux disease)   . History of pneumonia   . Hyperlipidemia   . Ischemic cardiomyopathy   . Mitral regurgitation   . NSTEMI (non-ST elevated myocardial infarction) ([place]) 03/10/2016  . Squamous cell cancer of skin of nose   . Thrombocytosis ([place]) 03/26/2016  . Wears  dentures   . Wears glasses     Patient Active Problem List   Diagnosis Date Noted  . Chronic combined systolic and diastolic CHF (congestive heart failure) ([place]) 06/20/2016  . Hypokalemia   . Acute CHF (congestive heart failure) ([place]) 05/16/2016  . Acute on chronic systolic CHF (congestive heart failure) ([place]) 05/16/2016  . Acute respiratory failure with hypoxia ([place])   . Thrombocytosis ([place]) 03/26/2016  . Cardiomyopathy, ischemic 03/25/2016  . Acute on chronic combined systolic and diastolic congestive heart failure ([place]) 03/25/2016  . Anxiety state 03/25/2016  . Troponin level elevated 03/25/2016  . CAD (coronary artery disease) 03/25/2016  . Normocytic anemia 03/25/2016  . SOB (shortness of breath) 03/24/2016  . Lightheadedness 03/17/2016  . Hypotension 03/17/2016  . Tobacco abuse 03/12/2016  . NSTEMI (non-ST elevated myocardial infarction) ([place]) 03/11/2016  . Pain in the chest   . Essential hypertension 09/06/2015  . Hyperlipidemia 09/06/2015  . GERD (gastroesophageal reflux disease) 09/06/2015  . Chest pain 09/06/2015    Past Surgical History:  Procedure Laterality Date  . APPENDECTOMY    . CARDIAC CATHETERIZATION N/A 03/11/2016   Procedure: Left Heart Cath and Coronary Angiography;  Surgeon: Leonie Man, MD;  Location: [facility];  Service: Cardiovascular;  Laterality: N/A;  . CARDIAC CATHETERIZATION N/A 03/11/2016   Procedure: Coronary Stent Intervention;  Surgeon: Leonie Man, MD;  Location: [facility];  Service: Cardiovascular;  Laterality: N/A;  . CARDIAC CATHETERIZATION N/A 03/14/2016   Procedure:  Coronary Stent Intervention;  Surgeon: Peter M Martinique, MD;  Location: [facility];  Service: Cardiovascular;  Laterality: N/A;  . CHOLECYSTECTOMY OPEN  1984  . CORONARY ANGIOPLASTY WITH STENT PLACEMENT  03/14/2016  . FINGER ARTHROPLASTY Left 05/14/2013   Procedure: LEFT THUMB CARPAL METACARPAL ARTHROPLASTY;  Surgeon: Tennis Must, MD;   Location: [facility];  Service: Orthopedics;  Laterality: Left;  . TUBAL LIGATION  1987  . VAGINAL HYSTERECTOMY  2009    Current Outpatient Rx  . Order #: [id number] Class: Normal  . Order #: [id number] Class: Historical Med  . Order #: [id number] Class: Print  . Order #: [id number] Class: Normal  . Order #: [id number] Class: Normal  . Order #: [id number] Class: Print  . Order #: [id number] Class: No Print  . Order #: [id number] Class: Normal  . Order #: [id number] Class: Historical Med  . Order #: [id number] Class: Normal  . Order #: [id number] Class: Normal    Allergies Patient has no known allergies.  Family History  Problem Relation Age of Onset  . Stroke Mother   . Hypertension Mother   . Diabetes Mother   . Heart failure Other     Social History Social History  Substance Use Topics  . Smoking status: Former Smoker    Packs/day: 1.00    Years: 15.00    Types: Cigarettes  . Smokeless tobacco: Never Used     Comment: quit 2 weeks ago-03/24/16  . Alcohol use 3.6 oz/week    6 Cans of beer per week     Comment: occ    Review of Systems  Constitutional: No fever/chills Eyes: No visual changes. ENT: No sore throat. Cardiovascular: Positive chest pain. Respiratory: Denies shortness of breath. Gastrointestinal: No abdominal pain.  No nausea, no vomiting.  No diarrhea.  No constipation. Genitourinary: Negative for dysuria. Musculoskeletal: Negative for back pain. Skin: Negative for rash. Neurological: Negative for headaches, focal weakness or numbness.  10-point ROS otherwise negative.  ____________________________________________   PHYSICAL EXAM:  VITAL SIGNS: ED Triage Vitals [07/05/16 1115]  Enc Vitals Group     BP 115/72     Pulse Rate 95     Resp 18     Temp 98 F (36.7 C)     Temp Source Temporal     SpO2 98 %     Weight 112 lb (50.8 kg)     Height 5' (1.524 m)     Pain Score 7   Constitutional: Alert and oriented. Well appearing and in no acute  distress. Eyes: Conjunctivae are normal.  Head: Atraumatic. Nose: No congestion/rhinnorhea. Mouth/Throat: Mucous membranes are moist.  Neck: No stridor.   Cardiovascular: Normal rate, regular rhythm. Good peripheral circulation. Grossly normal heart sounds.   Respiratory: Normal respiratory effort.  No retractions. Lungs CTAB. Gastrointestinal: Soft and nontender. No distention.  Musculoskeletal: No lower extremity tenderness nor edema. No gross deformities of extremities. Neurologic:  Normal speech and language. No gross focal neurologic deficits are appreciated.  Skin:  Skin is warm, dry and intact. No rash noted. Psychiatric: Mood and affect are normal. Speech and behavior are normal.  ____________________________________________   LABS (all labs ordered are listed, but only abnormal results are displayed)  Labs Reviewed  CBC - Abnormal; Notable for the following:       Result Value   RDW 16.1 (*)    All other components within normal limits  BASIC METABOLIC PANEL  TROPONIN I  I-STAT TROPOININ, ED   ____________________________________________  EKG   EKG Interpretation  Date/Time:  Tuesday July 05 2016 11:08:59 EST Ventricular Rate:  98 PR Interval:  122 QRS Duration: 60 QT Interval:  348 QTC Calculation: 444 R Axis:   39 Text Interpretation:  Sinus rhythm with occasional Premature ventricular complexes T wave abnormality, consider inferior ischemia Abnormal ECG No STEMI.  Confirmed by Laurine Kuyper MD, Jevante Hollibaugh [phone]) on 07/05/2016 1:44:29 PM       ____________________________________________  RADIOLOGY  Dg Chest 2 View  Result Date: 07/05/2016 CLINICAL DATA:  Chest pressure and heaviness with shortness of breath for the past 2 weeks with increased symptoms recently. Patient recently began Plavix. History of MI with 3 stents placed in March 28 2016. EXAM: CHEST  2 VIEW COMPARISON:  PA and lateral chest x-ray of June 20, 2016 FINDINGS: The lungs are mildly  hyperinflated and clear. The heart and pulmonary vascularity are normal. Coronary artery stents are visible. The mediastinum is normal in width. There is calcification in the wall of the aortic arch. The bony thorax exhibits no acute abnormality. IMPRESSION: Hyperinflation consistent with COPD or reactive airway disease. No CHF nor pneumonia nor other acute cardiopulmonary abnormality. Thoracic aortic atherosclerosis. Electronically Signed   By: David  Martinique M.D.   On: 07/05/2016 11:49    ____________________________________________   PROCEDURES  Procedure(s) performed:   Procedures  None ____________________________________________   INITIAL IMPRESSION / ASSESSMENT AND PLAN / ED COURSE  Pertinent labs & imaging results that were available during my care of the patient were reviewed by me and considered in my medical decision making (see chart for details).  Patient resents to the emergency department for evaluation of chest pressure. She's had this intermittently for several weeks and it occurs primarily when she is at work or increases with movement. She had some asymptomatic hypoxemia recorded at work today. Her oxygen saturation here is 100% on room air. Lung exam is normal. Chest pressure, despite the patient's notable risk factors, is very atypical and has been going on for several weeks intermittently and seems associated only with work and movement. She had a recent admission for chest pain that was different than this with no acute findings at that time. She's been compliant with her Plavix with recent drug-eluting stent placement. Given the atypical symptoms and close cardiology follow-up plan for serial biomarkers and discharge unless labs become abnormal or patient has more concerning symptoms.  03:49 PM Second troponin is negative. Plan for discharge with cardiology follow-up. Discussed return precautions in detail.  At this time, I do not feel there is any life-threatening  condition present. I have reviewed and discussed all results (EKG, imaging, lab, urine as appropriate), exam findings with patient. I have reviewed nursing notes and appropriate previous records.  I feel the patient is safe to be discharged home without further emergent workup. Discussed usual and customary return precautions. Patient and family (if present) verbalize understanding and are comfortable with this plan.  Patient will follow-up with their primary care provider. If they do not have a primary care provider, information for follow-up has been provided to them. All questions have been answered.  ____________________________________________  FINAL CLINICAL IMPRESSION(S) / ED DIAGNOSES  Final diagnoses:  Nonspecific chest pain     MEDICATIONS GIVEN DURING THIS VISIT:  None  NEW OUTPATIENT MEDICATIONS STARTED DURING THIS VISIT:  None   Note:  This document was prepared using Dragon voice recognition software and may include unintentional dictation errors.  Nanda Quinton, MD Emergency Medicine   Margette Fast, MD 07/05/16 2121

## 2016-07-05 NOTE — ED Triage Notes (Signed)
Pt comes in for chest pressure starting today in the center of chest. Pt has had shortness of breath for several months. NAD noted in triage. VS stable.

## 2016-07-05 NOTE — Discharge Instructions (Signed)

## 2016-07-06 ENCOUNTER — Encounter (HOSPITAL_COMMUNITY): Payer: PRIVATE HEALTH INSURANCE

## 2016-07-06 NOTE — Progress Notes (Signed)
Cardiology Office Note    Date:  07/07/2016   ID:  [name], DOB 1961-03-20, MRN [id number]  PCP:  Jonathon Bellows, MD  Cardiologist: seen by Dr. Loraine Maple and Dr. Johnsie Cancel   CC: post ER follow up- SOB  History of Present Illness:  Erin [name] is a 56 y.o. female with a history of CAD s/p multiple stents, HTN, tobacco abuse, ischemic CM (EF 35-40%), mild MR, previous tobacco abuse who presents to clinic for follow up.   She has a long history of hypertension with multiple admissions for hypertensive urgencies.   She also has a history of CAD. She had a NSTEMI and admission to [facility] hospital in 03/2016 with cardiac cath revealing prox-mid circumflex lesion 100% stenosed, with DES placed over previously a placed stent (no other notes talk about this stent) on 03/11/16 and subsequent stenting of RCA and Right PDA on 03/14/2016. Of note cath showed severe MR but 2D ECHO only showed mild MR. 2D EHHO showed EF 30-35% with G2DD and mild MR.   Since that time she has been admitted to the hospital and seen in the ER multiple times.   Admitted 11/9-11/10/17 for lightheadedness/generalized weakness Admitted 11/16-11/18/17 for SOB felt to be related to anxiety Admitted 1/8-1/10/18 for DOE and CP- found to have A/C combined CHF. BNP elevated to 783. Diuresed and placed on Entresto, but then went back on Losartan for unclear reasons. 2D ECHO at that time showed EF 35-40%, G1DD, mild MR, mod pericardial effusion with no tamponade.  Admitted and discharged same day (06/20/16) for atypical chest pain. GI referral was recommended.   Seen in the ER 07/05/16 for recurrent chest pain and hypoxia noted at work. In the ED her 02 was 100% and she ruled out for MI. She was discharged home with cariology follow up.  Today she present to clinic for follow up. She comes in today complaining of worsening SOB. Just feels like she cant get a deep breath in. Worse with exertion. Reminds her of when she was on  Brilinta. No CP currently. She has constant chest pressure but not like when she had her MI.  No LE edema, orthopnea or PND. No dizziness or syncope. No blood in stool or urine. She often gets palpitations. Still quit smoking. She has not had any recent long car trips.     Past Medical History:  Diagnosis Date  . Anxiety state 03/25/2016  . Basal cell carcinoma of forehead   . CHF (congestive heart failure) ([place])   . Coronary artery disease    a. 03/11/16 PCI with DES-->Prox/Mid Cx, 03/14/16 PCI with DES x2-->RCA, EF 30-35%  . Essential hypertension   . GERD (gastroesophageal reflux disease)   . History of pneumonia   . Hyperlipidemia   . Ischemic cardiomyopathy   . Mitral regurgitation   . NSTEMI (non-ST elevated myocardial infarction) ([place]) 03/10/2016  . Squamous cell cancer of skin of nose   . Thrombocytosis ([place]) 03/26/2016  . Wears dentures   . Wears glasses     Past Surgical History:  Procedure Laterality Date  . APPENDECTOMY    . CARDIAC CATHETERIZATION N/A 03/11/2016   Procedure: Left Heart Cath and Coronary Angiography;  Surgeon: Leonie Man, MD;  Location: [facility];  Service: Cardiovascular;  Laterality: N/A;  . CARDIAC CATHETERIZATION N/A 03/11/2016   Procedure: Coronary Stent Intervention;  Surgeon: Leonie Man, MD;  Location: [facility];  Service: Cardiovascular;  Laterality: N/A;  .  CARDIAC CATHETERIZATION N/A 03/14/2016   Procedure: Coronary Stent Intervention;  Surgeon: Peter M Martinique, MD;  Location: [facility];  Service: Cardiovascular;  Laterality: N/A;  . CHOLECYSTECTOMY OPEN  1984  . CORONARY ANGIOPLASTY WITH STENT PLACEMENT  03/14/2016  . FINGER ARTHROPLASTY Left 05/14/2013   Procedure: LEFT THUMB CARPAL METACARPAL ARTHROPLASTY;  Surgeon: Tennis Must, MD;  Location: [facility];  Service: Orthopedics;  Laterality: Left;  . TUBAL LIGATION  1987  . VAGINAL HYSTERECTOMY  2009    Current Medications: Outpatient  Medications Prior to Visit  Medication Sig Dispense Refill  . acetaminophen (TYLENOL) 500 MG tablet Take 1 tablet (500 mg total) by mouth every 8 (eight) hours as needed for mild pain, moderate pain or headache. 30 tablet 0  . aspirin EC 81 MG tablet Take 81 mg by mouth daily.    . carvedilol (COREG) 3.125 MG tablet Take 1 tablet (3.125 mg total) by mouth 2 (two) times daily with a meal. 60 tablet 3  . clopidogrel (PLAVIX) 75 MG tablet Take 1 tablet (75 mg total) by mouth daily. 90 tablet 3  . furosemide (LASIX) 20 MG tablet Take 1 tablet (20 mg total) by mouth daily. 30 tablet 6  . LORazepam (ATIVAN) 0.5 MG tablet Take 1 tablet (0.5 mg total) by mouth every 12 (twelve) hours as needed for anxiety. DO NOT DRIVE WHEN TAKING THIS MEDICATION. 20 tablet 0  . losartan (COZAAR) 25 MG tablet Take 0.5 tablets (12.5 mg total) by mouth every evening. 15 tablet 0  . nitroGLYCERIN (NITROSTAT) 0.4 MG SL tablet Place 1 tablet (0.4 mg total) under the tongue every 5 (five) minutes x 3 doses as needed for chest pain. 25 tablet 3  . pantoprazole (PROTONIX) 20 MG tablet Take 20 mg by mouth 2 (two) times daily.    . potassium chloride SA (K-DUR,KLOR-CON) 20 MEQ tablet Take 1 tablet (20 mEq total) by mouth daily. 30 tablet 0  . ranitidine (ZANTAC) 150 MG tablet Take 1 tablet (150 mg total) by mouth 2 (two) times daily. 60 tablet 0   No facility-administered medications prior to visit.      Allergies:   Patient has no known allergies.   Social History   Social History  . Marital status: Married    Spouse name: N/A  . Number of children: N/A  . Years of education: N/A   Social History Main Topics  . Smoking status: Former Smoker    Packs/day: 1.00    Years: 15.00    Types: Cigarettes  . Smokeless tobacco: Never Used     Comment: quit 2 weeks ago-03/24/16  . Alcohol use 3.6 oz/week    6 Cans of beer per week     Comment: occ  . Drug use: Yes    Types: Marijuana     Comment: former  . Sexual  activity: Yes    Birth control/ protection: Surgical   Other Topics Concern  . None   Social History Narrative  . None     Family History:  The patient'sfamily history includes Diabetes in her mother; Heart failure in her other; Hypertension in her mother; Stroke in her mother.      ROS:   Please see the history of present illness.    ROS All other systems reviewed and are negative.   PHYSICAL EXAM:   VS:  BP 126/76   Pulse 72   Ht 5' (1.524 m)   Wt 112 lb (50.8 kg)  BMI 21.87 kg/m    GEN: Well nourished, well developed, in no acute distress  HEENT: normal  Neck: no JVD, carotid bruits, or masses Cardiac: RRR; no murmurs, rubs, or gallops,no edema  Respiratory:  clear to auscultation bilaterally, normal work of breathing GI: soft, nontender, nondistended, + BS MS: no deformity or atrophy  Skin: warm and dry, no rash Neuro:  Alert and Oriented x 3, Strength and sensation are intact Psych: euthymic mood, full affect     Wt Readings from Last 3 Encounters:  07/07/16 112 lb (50.8 kg)  07/05/16 112 lb (50.8 kg)  06/20/16 112 lb (50.8 kg)      Studies/Labs Reviewed:   EKG:  EKG is NOT ordered today.    Recent Labs: 03/24/2016: ALT 18 05/16/2016: TSH 0.937 05/17/2016: Magnesium 2.0 06/20/2016: B Natriuretic Peptide 148.0 07/05/2016: BUN 17; Creatinine, Ser 0.86; Hemoglobin 12.0; Platelets 378; Potassium 4.2; Sodium 139   Lipid Panel    Component Value Date/Time   CHOL 107 05/17/2016 0536   TRIG 168 (H) 05/17/2016 0536   HDL 41 05/17/2016 0536   CHOLHDL 2.6 05/17/2016 0536   VLDL 34 05/17/2016 0536   LDLCALC 32 05/17/2016 0536    Additional studies/ records that were reviewed today include:   Cath 03/11/16 Procedures  Coronary Stent Intervention  Left Heart Cath and Coronary Angiography  Conclusion     Prox-Mid Cx lesion, 100 %stenosed.  A STENT SYNERGY DES 3X12 drug eluting stent was successfully placed, and overlaps previously placed stent.  Post  intervention, there is a 0% residual stenosis.  Dist RCA lesion, 80 %stenosed. - Would consider treatment in a staged fashion, could either be done during this hospitalization or as an outpatient.  The left ventricular ejection fraction is 45-50% by visual estimate.  There is severe (4+) mitral regurgitation.  LV end diastolic pressure is severely elevated.    Pathophysiologically, this is equivalent to a non-ST elevation MI, however with no EKG changes, is technically non-STEMI.  She will be admitted to [facility] post procedure unit/stepdown. Was chest pain-free upon completion of the procedure.  Plan:  TR band removal per protocol  Continue Angiomax until current bag completed  Nitroglycerin infusion weaned from 40 g/min down to 20. Would continue to wean overnight.  1dose IV Lasix 40 mg for elevated LVEDP and MR  Order home dose metoprolol for this evening, and start ACE inhibitor tomorrow.  Dual antiplatelet therapy for minimum of 3 months, could potentially consider stopping aspirin after 3 months  We'll need 2-D echocardiogram to clarify extent of MR       ASSESSMENT & PLAN:   SOB: no s/s volume overload. CXR a couple days ago with COPD but no CHF. Will check a BNP and D dimer. If both of these are negative, I will get an ett myoview.   CAD: stable. She has chronic chest pain. Continue ASA and BB. Not on statin for unclear reasons. SHe said someone told her to stop taking it. Her LFTs have been normal. Will add back atorva 80mg  daily.   Ischemic CM: continue ARB and BB. Appears euvolemic. Will check BNP as above. Continue lasix 20mg  daily for now.   HTN: BP well controlled currently   Anxiety: this may be playing a role.   Total time spent with patient was over 40 minutes which included evaluating patient, reviewing record and coordinating care. Face to face time >50%.  It took me 45 minutes to just get through all her past medical history  in chart.   Medication  Adjustments/Labs and Tests Ordered: Current medicines are reviewed at length with the patient today.  Concerns regarding medicines are outlined above.  Medication changes, Labs and Tests ordered today are listed in the Patient Instructions below. Patient Instructions  Medication Instructions:  Your physician has recommended you make the following change in your medication:  1.  RESTART the Lipitor (Atorvastatin) 80 mg taking 1 daily   Labwork: TODAY:  BNP & DDIMER  Testing/Procedures: If your lab results come back negative, we will order a exercise stress myoview. For further information please visit [email or web address]. Please follow instruction sheet, as given.   Follow-Up: Your physician recommends that you schedule a follow-up appointment in: 3 MONTHS WITH DR. Marlou Porch   Any Other Special Instructions Will Be Listed Below (If Applicable).   Exercise Stress Electrocardiogram An exercise stress electrocardiogram is a test to check how blood flows to your heart. It is done to find areas of poor blood flow. You will need to walk on a treadmill for this test. The electrocardiogram will record your heartbeat when you are at rest and when you are exercising. What happens before the procedure?  Do not have drinks with caffeine or foods with caffeine for 24 hours before the test, or as told by your doctor. This includes coffee, tea (even decaf tea), sodas, chocolate, and cocoa.  Follow your doctor's instructions about eating and drinking before the test.  Ask your doctor what medicines you should or should not take before the test. Take your medicines with water unless told by your doctor not to.  If you use an inhaler, bring it with you to the test.  Bring a snack to eat after the test.  Do not  smoke for 4 hours before the test.  Do not put lotions, powders, creams, or oils on your chest before the test.  Wear comfortable shoes and clothing. What happens during the  procedure?  You will have patches put on your chest. Small areas of your chest may need to be shaved. Wires will be connected to the patches.  Your heart rate will be watched while you are resting and while you are exercising.  You will walk on the treadmill. The treadmill will slowly get faster to raise your heart rate.  The test will take about 1-2 hours. What happens after the procedure?  Your heart rate and blood pressure will be watched after the test.  You may return to your normal diet, activities, and medicines or as told by your doctor. This information is not intended to replace advice given to you by your health care provider. Make sure you discuss any questions you have with your health care provider. Document Released: 10/12/2007 Document Revised: 12/23/2015 Document Reviewed: 12/31/2012 Elsevier Interactive Patient Education  2017 [name].  If you need a refill on your cardiac medications before your next appointment, please call your pharmacy.      Signed, Angelena Form, PA-C  07/07/2016 8:46 AM    [facility] [place], [place], [place]  [address] Phone: [phone]; Fax: [phone]

## 2016-07-07 ENCOUNTER — Ambulatory Visit (INDEPENDENT_AMBULATORY_CARE_PROVIDER_SITE_OTHER): Payer: PRIVATE HEALTH INSURANCE | Admitting: Physician Assistant

## 2016-07-07 ENCOUNTER — Encounter: Payer: Self-pay | Admitting: *Deleted

## 2016-07-07 ENCOUNTER — Encounter: Payer: Self-pay | Admitting: Physician Assistant

## 2016-07-07 VITALS — BP 126/76 | HR 72 | Ht 60.0 in | Wt 112.0 lb

## 2016-07-07 DIAGNOSIS — R0602 Shortness of breath: Secondary | ICD-10-CM

## 2016-07-07 DIAGNOSIS — I5022 Chronic systolic (congestive) heart failure: Secondary | ICD-10-CM | POA: Diagnosis not present

## 2016-07-07 DIAGNOSIS — I255 Ischemic cardiomyopathy: Secondary | ICD-10-CM | POA: Diagnosis not present

## 2016-07-07 DIAGNOSIS — R079 Chest pain, unspecified: Secondary | ICD-10-CM

## 2016-07-07 DIAGNOSIS — I340 Nonrheumatic mitral (valve) insufficiency: Secondary | ICD-10-CM

## 2016-07-07 DIAGNOSIS — I25119 Atherosclerotic heart disease of native coronary artery with unspecified angina pectoris: Secondary | ICD-10-CM

## 2016-07-07 DIAGNOSIS — I10 Essential (primary) hypertension: Secondary | ICD-10-CM | POA: Diagnosis not present

## 2016-07-07 LAB — D-DIMER, QUANTITATIVE (NOT AT ARMC): D-DIMER: 0.25 mg/L FEU (ref 0.00–0.49)

## 2016-07-07 MED ORDER — ATORVASTATIN CALCIUM 80 MG PO TABS: 80.0000 mg | ORAL_TABLET | Freq: Every day | ORAL | 3 refills | Status: DC

## 2016-07-07 NOTE — Patient Instructions (Addendum)
Medication Instructions:  Your physician has recommended you make the following change in your medication:  1.  RESTART the Lipitor (Atorvastatin) 80 mg taking 1 daily   Labwork: TODAY:  BNP & DDIMER  Testing/Procedures: If your lab results come back negative, we will order a exercise stress myoview. For further information please visit [email or web address]. Please follow instruction sheet, as given.   Follow-Up: Your physician recommends that you schedule a follow-up appointment in: 3 MONTHS WITH DR. Marlou Porch   Any Other Special Instructions Will Be Listed Below (If Applicable).   Exercise Stress Electrocardiogram An exercise stress electrocardiogram is a test to check how blood flows to your heart. It is done to find areas of poor blood flow. You will need to walk on a treadmill for this test. The electrocardiogram will record your heartbeat when you are at rest and when you are exercising. What happens before the procedure?  Do not have drinks with caffeine or foods with caffeine for 24 hours before the test, or as told by your doctor. This includes coffee, tea (even decaf tea), sodas, chocolate, and cocoa.  Follow your doctor's instructions about eating and drinking before the test.  Ask your doctor what medicines you should or should not take before the test. Take your medicines with water unless told by your doctor not to.  If you use an inhaler, bring it with you to the test.  Bring a snack to eat after the test.  Do not  smoke for 4 hours before the test.  Do not put lotions, powders, creams, or oils on your chest before the test.  Wear comfortable shoes and clothing. What happens during the procedure?  You will have patches put on your chest. Small areas of your chest may need to be shaved. Wires will be connected to the patches.  Your heart rate will be watched while you are resting and while you are exercising.  You will walk on the treadmill. The treadmill will  slowly get faster to raise your heart rate.  The test will take about 1-2 hours. What happens after the procedure?  Your heart rate and blood pressure will be watched after the test.  You may return to your normal diet, activities, and medicines or as told by your doctor. This information is not intended to replace advice given to you by your health care provider. Make sure you discuss any questions you have with your health care provider. Document Released: 10/12/2007 Document Revised: 12/23/2015 Document Reviewed: 12/31/2012 Elsevier Interactive Patient Education  2017 [name].  If you need a refill on your cardiac medications before your next appointment, please call your pharmacy.

## 2016-07-08 ENCOUNTER — Ambulatory Visit: Payer: PRIVATE HEALTH INSURANCE | Admitting: Cardiovascular Disease

## 2016-07-08 ENCOUNTER — Telehealth: Payer: Self-pay | Admitting: *Deleted

## 2016-07-08 ENCOUNTER — Encounter (HOSPITAL_COMMUNITY): Payer: PRIVATE HEALTH INSURANCE

## 2016-07-08 DIAGNOSIS — Z79899 Other long term (current) drug therapy: Secondary | ICD-10-CM

## 2016-07-08 LAB — PRO B NATRIURETIC PEPTIDE: NT-Pro BNP: 910 pg/mL — ABNORMAL HIGH (ref 0–249)

## 2016-07-08 MED ORDER — FUROSEMIDE 20 MG PO TABS: 40.0000 mg | ORAL_TABLET | Freq: Every day | ORAL | 6 refills | Status: DC

## 2016-07-08 NOTE — Telephone Encounter (Signed)
Pt aware of her lab results. She will increase lasix to 40 mg qd and repeat lab 07/18/16.

## 2016-07-08 NOTE — Telephone Encounter (Signed)
-----   Message from Erin [name], [place] sent at 07/08/2016  9:04 AM EST ----- Her d dimer was negative, meaning low risk for blood clot to the lungs. Her BNP was elevated, meaning some extra fluid (that we can't see on physical exam) could be making her SOB. Lets have her increase her lasix 20mg  daily to 40mg  daily and bring her back in for a BMET in 1-2 weeks. If this doesn't help her SOB after a week or so, we will set her up for the stress test. But, lets see how she responds to the increased lasix.

## 2016-07-11 ENCOUNTER — Encounter (HOSPITAL_COMMUNITY): Payer: PRIVATE HEALTH INSURANCE

## 2016-07-13 ENCOUNTER — Encounter (HOSPITAL_COMMUNITY)
Admission: RE | Admit: 2016-07-13 | Discharge: 2016-07-13 | Disposition: A | Discharge status: Home / Self Care | Payer: PRIVATE HEALTH INSURANCE | Source: Ambulatory Visit | Attending: Cardiology | Admitting: Cardiology

## 2016-07-13 ENCOUNTER — Ambulatory Visit: Payer: PRIVATE HEALTH INSURANCE | Admitting: Gastroenterology

## 2016-07-13 DIAGNOSIS — Z955 Presence of coronary angioplasty implant and graft: Secondary | ICD-10-CM | POA: Insufficient documentation

## 2016-07-13 DIAGNOSIS — I214 Non-ST elevation (NSTEMI) myocardial infarction: Secondary | ICD-10-CM | POA: Insufficient documentation

## 2016-07-13 NOTE — Progress Notes (Signed)
Cardiac Individual Treatment Plan  Patient Details  Name: Erin [name] MRN: [id number] Date of Birth: 04-09-1961 Referring Provider:   Flowsheet Row CARDIAC REHAB PHASE II ORIENTATION from 04/19/2016 in [facility]  Referring Provider  Dr. Radford Pax      Initial Encounter Date:  Flowsheet Row CARDIAC REHAB PHASE II ORIENTATION from 04/19/2016 in [facility]  Date  04/19/16  Referring Provider  Dr. Radford Pax      Visit Diagnosis: NSTEMI (non-ST elevated myocardial infarction) [facility])  Status post coronary artery stent placement  Patient's Home Medications on Admission:  Current Outpatient Prescriptions:  .  acetaminophen (TYLENOL) 500 MG tablet, Take 1 tablet (500 mg total) by mouth every 8 (eight) hours as needed for mild pain, moderate pain or headache., Disp: 30 tablet, Rfl: 0 .  aspirin EC 81 MG tablet, Take 81 mg by mouth daily., Disp: , Rfl:  .  atorvastatin (LIPITOR) 80 MG tablet, Take 1 tablet (80 mg total) by mouth daily., Disp: 90 tablet, Rfl: 3 .  carvedilol (COREG) 3.125 MG tablet, Take 1 tablet (3.125 mg total) by mouth 2 (two) times daily with a meal., Disp: 60 tablet, Rfl: 3 .  clopidogrel (PLAVIX) 75 MG tablet, Take 1 tablet (75 mg total) by mouth daily., Disp: 90 tablet, Rfl: 3 .  furosemide (LASIX) 20 MG tablet, Take 2 tablets (40 mg total) by mouth daily., Disp: 60 tablet, Rfl: 6 .  LORazepam (ATIVAN) 0.5 MG tablet, Take 1 tablet (0.5 mg total) by mouth every 12 (twelve) hours as needed for anxiety. DO NOT DRIVE WHEN TAKING THIS MEDICATION., Disp: 20 tablet, Rfl: 0 .  losartan (COZAAR) 25 MG tablet, Take 0.5 tablets (12.5 mg total) by mouth every evening., Disp: 15 tablet, Rfl: 0 .  nitroGLYCERIN (NITROSTAT) 0.4 MG SL tablet, Place 1 tablet (0.4 mg total) under the tongue every 5 (five) minutes x 3 doses as needed for chest pain., Disp: 25 tablet, Rfl: 3 .  pantoprazole (PROTONIX) 20 MG tablet, Take 20 mg by mouth 2 (two)  times daily., Disp: , Rfl:  .  potassium chloride SA (K-DUR,KLOR-CON) 20 MEQ tablet, Take 1 tablet (20 mEq total) by mouth daily., Disp: 30 tablet, Rfl: 0 .  ranitidine (ZANTAC) 150 MG tablet, Take 1 tablet (150 mg total) by mouth 2 (two) times daily., Disp: 60 tablet, Rfl: 0  Past Medical History: Past Medical History:  Diagnosis Date  . Anxiety state 03/25/2016  . Basal cell carcinoma of forehead   . CHF (congestive heart failure) ([place])   . Coronary artery disease    a. 03/11/16 PCI with DES-->Prox/Mid Cx, 03/14/16 PCI with DES x2-->RCA, EF 30-35%  . Essential hypertension   . GERD (gastroesophageal reflux disease)   . History of pneumonia   . Hyperlipidemia   . Ischemic cardiomyopathy   . Mitral regurgitation   . NSTEMI (non-ST elevated myocardial infarction) ([place]) 03/10/2016  . Squamous cell cancer of skin of nose   . Thrombocytosis ([place]) 03/26/2016  . Wears dentures   . Wears glasses     Tobacco Use: History  Smoking Status  . Former Smoker  . Packs/day: 1.00  . Years: 15.00  . Types: Cigarettes  Smokeless Tobacco  . Never Used    Comment: quit 2 weeks ago-03/24/16    Labs: Recent Review Flowsheet Data    Labs for ITP Cardiac and Pulmonary Rehab Latest Ref Rng & Units 09/06/2015 01/15/2016 03/11/2016 05/16/2016 05/17/2016   Cholestrol 0 - 200 mg/dL 229(H) 273(H) - -  107   LDLCALC 0 - 99 mg/dL 130(H) 146(H) - - 32   HDL >40 mg/dL 34(L) 53 - - 41   Trlycerides <150 mg/dL 324(H) 371(H) - - 168(H)   Hemoglobin A1c 4.8 - 5.6 % 5.7(H) - - 5.4 -   TCO2 0 - 100 mmol/L - - 22 - -      Capillary Blood Glucose: No results found for: GLUCAP   Exercise Target Goals:    Exercise Program Goal: Individual exercise prescription set with THRR, safety & activity barriers. Participant demonstrates ability to understand and report RPE using BORG scale, to self-measure pulse accurately, and to acknowledge the importance of the exercise prescription.  Exercise Prescription  Goal: Starting with aerobic activity 30 plus minutes a day, 3 days per week for initial exercise prescription. Provide home exercise prescription and guidelines that participant acknowledges understanding prior to discharge.  Activity Barriers & Risk Stratification:     Activity Barriers & Cardiac Risk Stratification - 04/19/16 1015      Activity Barriers & Cardiac Risk Stratification   Activity Barriers None   Cardiac Risk Stratification High      6 Minute Walk:     6 Minute Walk    Row Name 04/19/16 1011         6 Minute Walk   Phase Initial     Distance 1250 feet     Distance % Change 0 %     Walk Time 6 minutes     # of Rest Breaks 0     MPH 2.36     METS 2.81     RPE 9     Perceived Dyspnea  13     VO2 Peak 13.15     Symptoms No     Resting HR 92 bpm     Resting BP 106/74     Max Ex. HR 108 bpm     Max Ex. BP 120/74     2 Minute Post BP 106/72        Oxygen Initial Assessment:   Oxygen Re-Evaluation:   Oxygen Discharge (Final Oxygen Re-Evaluation):   Initial Exercise Prescription:     Initial Exercise Prescription - 04/19/16 1000      Date of Initial Exercise RX and Referring Provider   Date 04/19/16   Referring Provider Dr. Radford Pax     Treadmill   MPH 1.3   Grade 0   Minutes 20   METs 1.9     NuStep   Level 2   SPM 10   Minutes 15   METs 1.6     Prescription Details   Frequency (times per week) 3   Duration Progress to 30 minutes of continuous aerobic without signs/symptoms of physical distress     Intensity   THRR REST +  30   THRR 40-80% of Max Heartrate [phone]   Ratings of Perceived Exertion 11-13   Perceived Dyspnea 0-4     Progression   Progression Continue progressive overload as per policy without signs/symptoms or physical distress.     Resistance Training   Training Prescription Yes   Weight 1   Reps 10-12      Perform Capillary Blood Glucose checks as needed.  Exercise Prescription Changes:       Exercise Prescription Changes    Row Name 05/16/16 1400 06/16/16 0700           Response to Exercise   Blood Pressure (Admit) 126/72 100/60  Blood Pressure (Exercise) 150/84 122/70      Blood Pressure (Exit) 132/68 100/62      Heart Rate (Admit) 87 bpm 68 bpm      Heart Rate (Exercise) 120 bpm 67 bpm      Heart Rate (Exit) 91 bpm 85 bpm      Rating of Perceived Exertion (Exercise) 13 11      Duration Progress to 30 minutes of continuous aerobic without signs/symptoms of physical distress Progress to 30 minutes of continuous aerobic without signs/symptoms of physical distress      Intensity Rest + 30 Rest + 30        Progression   Progression Continue progressive overload as per policy without signs/symptoms or physical distress. Continue progressive overload as per policy without signs/symptoms or physical distress.        Resistance Training   Training Prescription Yes Yes      Weight 1 1      Reps 10-12 10-12        Treadmill   MPH 1.5 2      Grade 0 4      Minutes 20 20      METs 2.1 2        NuStep   Level 2 2      SPM 15 13      Minutes 15 15      METs 3.23 3.22        Home Exercise Plan   Plans to continue exercise at [facility] 2 additional days to program exercise sessions. Add 2 additional days to program exercise sessions.        Exercise Review   Progression Yes Yes         Exercise Comments:      Exercise Comments    Row Name 05/16/16 1420 06/16/16 0757 07/11/16 1351       Exercise Comments Patient is progressing well. Patient is progressing well. Patienty has not progressed in CR due to lack of attendance and health issues.         Exercise Goals and Review:   Exercise Goals Re-Evaluation :    Discharge Exercise Prescription (Final Exercise Prescription Changes):     Exercise Prescription Changes - 06/16/16 0700      Response to Exercise   Blood Pressure (Admit) 100/60   Blood Pressure (Exercise) 122/70    Blood Pressure (Exit) 100/62   Heart Rate (Admit) 68 bpm   Heart Rate (Exercise) 67 bpm   Heart Rate (Exit) 85 bpm   Rating of Perceived Exertion (Exercise) 11   Duration Progress to 30 minutes of continuous aerobic without signs/symptoms of physical distress   Intensity Rest + 30     Progression   Progression Continue progressive overload as per policy without signs/symptoms or physical distress.     Resistance Training   Training Prescription Yes   Weight 1   Reps 10-12     Treadmill   MPH 2   Grade 4   Minutes 20   METs 2     NuStep   Level 2   SPM 13   Minutes 15   METs 3.22     Home Exercise Plan   Plans to continue exercise at Home   Frequency Add 2 additional days to program exercise sessions.     Exercise Review   Progression Yes      Nutrition:  Target Goals: Understanding of nutrition guidelines,  daily intake of sodium 1500mg , cholesterol 200mg , calories 30% from fat and 7% or less from saturated fats, daily to have 5 or more servings of fruits and vegetables.  Biometrics:     Pre Biometrics - 04/19/16 1014      Pre Biometrics   Height 5' (1.524 m)   Weight 116 lb 2.9 oz (52.7 kg)   Waist Circumference 30 inches   Hip Circumference 34.5 inches   Waist to Hip Ratio 0.87 %   BMI (Calculated) 22.7   Triceps Skinfold 14 mm   % Body Fat 30.5 %   Grip Strength 44.3 kg   Flexibility 15.3 in   Single Leg Stand 14 seconds       Nutrition Therapy Plan and Nutrition Goals:   Nutrition Discharge: Rate Your Plate Scores:     Nutrition Assessments - 04/19/16 1021      MEDFICTS Scores   Pre Score 49      Nutrition Goals Re-Evaluation:   Nutrition Goals Discharge (Final Nutrition Goals Re-Evaluation):   Psychosocial: Target Goals: Acknowledge presence or absence of significant depression and/or stress, maximize coping skills, provide positive support system. Participant is able to verbalize types and ability to use techniques and skills  needed for reducing stress and depression.  Initial Review & Psychosocial Screening:     Initial Psych Review & Screening - 04/19/16 1025      Initial Review   Current issues with Current Depression;Current Anxiety/Panic     Family Dynamics   Good Support System? Yes     Barriers   Psychosocial barriers to participate in program There are no identifiable barriers or psychosocial needs.  Patient is anxious about having another heart event, especially before bedtime. Concerned about her finances.      Screening Interventions   Interventions Encouraged to exercise      Quality of Life Scores:     Quality of Life - 04/19/16 1015      Quality of Life Scores   Health/Function Pre 10.87 %   Socioeconomic Pre 22 %   Psych/Spiritual Pre 16.29 %   Family Pre 20.4 %   GLOBAL Pre 15.68 %      PHQ-9: Recent Review Flowsheet Data    Depression screen [facility] 2/9 04/19/2016   Decreased Interest 2   Down, Depressed, Hopeless 3   PHQ - 2 Score 5   Altered sleeping 3   Tired, decreased energy 3   Change in appetite 3   Feeling bad or failure about yourself  3   Trouble concentrating 3   Moving slowly or fidgety/restless 0   Suicidal thoughts 0   PHQ-9 Score 20   Difficult doing work/chores Somewhat difficult     Interpretation of Total Score  Total Score Depression Severity:  1-4 = Minimal depression, 5-9 = Mild depression, 10-14 = Moderate depression, 15-19 = Moderately severe depression, 20-27 = Severe depression   Psychosocial Evaluation and Intervention:     Psychosocial Evaluation - 04/19/16 1026      Psychosocial Evaluation & Interventions   Interventions Relaxation education;Stress management education   Continue Psychosocial Services  Yes      Psychosocial Re-Evaluation:     Psychosocial Re-Evaluation    Row Name 05/19/16 0808 06/16/16 1530           Psychosocial Re-Evaluation   Comments Patient has depression/anxiety. Her QOL was 15.68 and her PHQ-9  was 20. She continues to say she does not need therapy.  Patient has anxiety/depression. She continues  to take anti-anxiety medication as treatment.       Interventions Encouraged to attend Cardiac Rehabilitation for the exercise Encouraged to attend Cardiac Rehabilitation for the exercise      Continue Psychosocial Services  Yes Yes         Psychosocial Discharge (Final Psychosocial Re-Evaluation):     Psychosocial Re-Evaluation - 06/16/16 1530      Psychosocial Re-Evaluation   Comments Patient has anxiety/depression. She continues to take anti-anxiety medication as treatment.    Interventions Encouraged to attend Cardiac Rehabilitation for the exercise   Continue Psychosocial Services  Yes      Vocational Rehabilitation: Provide vocational rehab assistance to qualifying candidates.   Vocational Rehab Evaluation & Intervention:     Vocational Rehab - 04/19/16 1019      Initial Vocational Rehab Evaluation & Intervention   Assessment shows need for Vocational Rehabilitation No      Education: Education Goals: Education classes will be provided on a weekly basis, covering required topics. Participant will state understanding/return demonstration of topics presented.  Learning Barriers/Preferences:     Learning Barriers/Preferences - 04/19/16 1015      Learning Barriers/Preferences   Learning Barriers None   Learning Preferences Video;Written Material;Pictoral;Computer/Internet      Education Topics: Hypertension, Hypertension Reduction -Define heart disease and high blood pressure. Discus how high blood pressure affects the body and ways to reduce high blood pressure.   Exercise and Your Heart -Discuss why it is important to exercise, the FITT principles of exercise, normal and abnormal responses to exercise, and how to exercise safely. Flowsheet Row CARDIAC REHAB PHASE II EXERCISE from 06/22/2016 in [facility]  Date  06/15/16  Educator  DC   Instruction Review Code  2- meets goals/outcomes      Angina -Discuss definition of angina, causes of angina, treatment of angina, and how to decrease risk of having angina. Flowsheet Row CARDIAC REHAB PHASE II EXERCISE from 06/22/2016 in [facility]  Date  06/22/16  Educator  Etheleen Mayhew  Instruction Review Code  2- meets goals/outcomes      Cardiac Medications -Review what the following cardiac medications are used for, how they affect the body, and side effects that may occur when taking the medications.  Medications include Aspirin, Beta blockers, calcium channel blockers, ACE Inhibitors, angiotensin receptor blockers, diuretics, digoxin, and antihyperlipidemics.   Congestive Heart Failure -Discuss the definition of CHF, how to live with CHF, the signs and symptoms of CHF, and how keep track of weight and sodium intake.   Heart Disease and Intimacy -Discus the effect sexual activity has on the heart, how changes occur during intimacy as we age, and safety during sexual activity.   Smoking Cessation / COPD -Discuss different methods to quit smoking, the health benefits of quitting smoking, and the definition of COPD.   Nutrition I: Fats -Discuss the types of cholesterol, what cholesterol does to the heart, and how cholesterol levels can be controlled. Flowsheet Row CARDIAC REHAB PHASE II EXERCISE from 06/22/2016 in [facility]  Date  04/27/16  Educator  Russella Dar  Instruction Review Code  2- meets goals/outcomes      Nutrition II: Labels -Discuss the different components of food labels and how to read food label [place] from 06/22/2016 in [facility]  Date  05/04/16  Educator  Russella Dar  Instruction Review Code  2- meets goals/outcomes      Heart Parts  and Heart Disease -Discuss the anatomy of the heart, the pathway of blood circulation through the heart, and  these are affected by heart disease.   Stress I: Signs and Symptoms -Discuss the causes of stress, how stress may lead to anxiety and depression, and ways to limit stress.   Stress II: Relaxation -Discuss different types of relaxation techniques to limit stress.   Warning Signs of Stroke / TIA -Discuss definition of a stroke, what the signs and symptoms are of a stroke, and how to identify when someone is having stroke.   Knowledge Questionnaire Score:     Knowledge Questionnaire Score - 04/19/16 1019      Knowledge Questionnaire Score   Pre Score 22/28      Core Components/Risk Factors/Patient Goals at Admission:     Personal Goals and Risk Factors at Admission - 04/19/16 1021      Core Components/Risk Factors/Patient Goals on Admission    Weight Management Weight Maintenance   Increase Strength and Stamina Yes   Intervention Provide advice, education, support and counseling about physical activity/exercise needs.;Develop an individualized exercise prescription for aerobic and resistive training based on initial evaluation findings, risk stratification, comorbidities and participant's personal goals.   Expected Outcomes Achievement of increased cardiorespiratory fitness and enhanced flexibility, muscular endurance and strength shown through measurements of functional capacity and personal statement of participant.   Tobacco Cessation Yes   Intervention Assist the participant in steps to quit. Provide individualized education and counseling about committing to Tobacco Cessation, relapse prevention, and pharmacological support that can be provided by physician.;Advice worker, assist with locating and accessing local/national Quit Smoking programs, and support quit date choice.   Expected Outcomes Long Term: Complete abstinence from all tobacco products for at least 12 months from quit date.;Short Term: Will demonstrate readiness to quit, by selecting a quit date.    Stress Yes   Intervention Offer individual and/or small group education and counseling on adjustment to heart disease, stress management and health-related lifestyle change. Teach and support self-help strategies.;Refer participants experiencing significant psychosocial distress to appropriate mental health specialists for further evaluation and treatment. When possible, include family members and significant others in education/counseling sessions.   Expected Outcomes Short Term: Participant demonstrates changes in health-related behavior, relaxation and other stress management skills, ability to obtain effective social support, and compliance with psychotropic medications if prescribed.;Long Term: Emotional wellbeing is indicated by absence of clinically significant psychosocial distress or social isolation.   Personal Goal Gain Strength, be where I was before event.    Intervention Attend CR 3 x week and supplement 2 x week at home.    Expected Outcomes Reach personal goals.       Core Components/Risk Factors/Patient Goals Review:      Goals and Risk Factor Review    Row Name 04/19/16 1024 05/19/16 0757 06/16/16 1527         Core Components/Risk Factors/Patient Goals Review   Personal Goals Review Increase Strength and Stamina;Stress;Tobacco Cessation Weight Management/Obesity;Increase Strength and Stamina;Tobacco Cessation  Gain Strength, be where I was before event.  Weight Management/Obesity;Increase Strength and Stamina;Tobacco Cessation  Gain strength; be where I was before event.     Review  - Patient has attended 5 sessions. She is currently hospitalized d/t acute CHF. Will continue to monitor for progress.  Patient has attended 8 sessions. She has been inconsistent in her attendance d/t family issues and health issues. She has had some progression. She continues to smoke. Will continue to monitor.  Expected Outcomes  - Patient will be able to complete the program meeting her  personal goals.  Patient will attend more consistently and complete the program meeting her personal goals.         Core Components/Risk Factors/Patient Goals at Discharge (Final Review):      Goals and Risk Factor Review - 06/16/16 1527      Core Components/Risk Factors/Patient Goals Review   Personal Goals Review Weight Management/Obesity;Increase Strength and Stamina;Tobacco Cessation  Gain strength; be where I was before event.   Review Patient has attended 8 sessions. She has been inconsistent in her attendance d/t family issues and health issues. She has had some progression. She continues to smoke. Will continue to monitor.    Expected Outcomes Patient will attend more consistently and complete the program meeting her personal goals.       ITP Comments:     ITP Comments    Row Name 04/19/16 1104 04/20/16 1322 07/13/16 1429       ITP Comments Erin [name] is still smoking just 1/2 cigarette/day. Will give her information about our somoking cessation program.  Patient new to program. Will start 04/25/16. Patient stopped coming after completing 12 sessions. Her attendance was very inconsistent.         Comments: Patient stopped coming to Cardiac Rehab on 07/04/2016 after completing 12 sessions. Doctor will be informed.

## 2016-07-13 NOTE — Progress Notes (Signed)
Discharge Summary  Patient Details  Name: Erin [name] MRN: [id number] Date of Birth: 01-31-1961 Referring Provider:   Flowsheet Row CARDIAC REHAB PHASE II ORIENTATION from 04/19/2016 in [facility]  Referring Provider  Dr. Radford Pax       Number of Visits: 12  Reason for Discharge:  Early Exit:  Personal  Smoking History:  History  Smoking Status  . Former Smoker  . Packs/day: 1.00  . Years: 15.00  . Types: Cigarettes  Smokeless Tobacco  . Never Used    Comment: quit 2 weeks ago-03/24/16    Diagnosis:  NSTEMI (non-ST elevated myocardial infarction) ([place])  Status post coronary artery stent placement  ADL UCSD:   Initial Exercise Prescription:     Initial Exercise Prescription - 04/19/16 1000      Date of Initial Exercise RX and Referring Provider   Date 04/19/16   Referring Provider Dr. Radford Pax     Treadmill   MPH 1.3   Grade 0   Minutes 20   METs 1.9     NuStep   Level 2   SPM 10   Minutes 15   METs 1.6     Prescription Details   Frequency (times per week) 3   Duration Progress to 30 minutes of continuous aerobic without signs/symptoms of physical distress     Intensity   THRR REST +  30   THRR 40-80% of Max Heartrate [phone]   Ratings of Perceived Exertion 11-13   Perceived Dyspnea 0-4     Progression   Progression Continue progressive overload as per policy without signs/symptoms or physical distress.     Resistance Training   Training Prescription Yes   Weight 1   Reps 10-12      Discharge Exercise Prescription (Final Exercise Prescription Changes):     Exercise Prescription Changes - 06/16/16 0700      Response to Exercise   Blood Pressure (Admit) 100/60   Blood Pressure (Exercise) 122/70   Blood Pressure (Exit) 100/62   Heart Rate (Admit) 68 bpm   Heart Rate (Exercise) 67 bpm   Heart Rate (Exit) 85 bpm   Rating of Perceived Exertion (Exercise) 11   Duration Progress to 30 minutes of continuous  aerobic without signs/symptoms of physical distress   Intensity Rest + 30     Progression   Progression Continue progressive overload as per policy without signs/symptoms or physical distress.     Resistance Training   Training Prescription Yes   Weight 1   Reps 10-12     Treadmill   MPH 2   Grade 4   Minutes 20   METs 2     NuStep   Level 2   SPM 13   Minutes 15   METs 3.22     Home Exercise Plan   Plans to continue exercise at Home   Frequency Add 2 additional days to program exercise sessions.     Exercise Review   Progression Yes      Functional Capacity:     6 Minute Walk    Row Name 04/19/16 1011         6 Minute Walk   Phase Initial     Distance 1250 feet     Distance % Change 0 %     Walk Time 6 minutes     # of Rest Breaks 0     MPH 2.36     METS 2.81     RPE 9  Perceived Dyspnea  13     VO2 Peak 13.15     Symptoms No     Resting HR 92 bpm     Resting BP 106/74     Max Ex. HR 108 bpm     Max Ex. BP 120/74     2 Minute Post BP 106/72        Psychological, QOL, Others - Outcomes: PHQ 2/9: Depression screen PHQ 2/9 04/19/2016  Decreased Interest 2  Down, Depressed, Hopeless 3  PHQ - 2 Score 5  Altered sleeping 3  Tired, decreased energy 3  Change in appetite 3  Feeling bad or failure about yourself  3  Trouble concentrating 3  Moving slowly or fidgety/restless 0  Suicidal thoughts 0  PHQ-9 Score 20  Difficult doing work/chores Somewhat difficult    Quality of Life:     Quality of Life - 04/19/16 1015      Quality of Life Scores   Health/Function Pre 10.87 %   Socioeconomic Pre 22 %   Psych/Spiritual Pre 16.29 %   Family Pre 20.4 %   GLOBAL Pre 15.68 %      Personal Goals: Goals established at orientation with interventions provided to work toward goal.     Personal Goals and Risk Factors at Admission - 04/19/16 1021      Core Components/Risk Factors/Patient Goals on Admission    Weight Management Weight  Maintenance   Increase Strength and Stamina Yes   Intervention Provide advice, education, support and counseling about physical activity/exercise needs.;Develop an individualized exercise prescription for aerobic and resistive training based on initial evaluation findings, risk stratification, comorbidities and participant's personal goals.   Expected Outcomes Achievement of increased cardiorespiratory fitness and enhanced flexibility, muscular endurance and strength shown through measurements of functional capacity and personal statement of participant.   Tobacco Cessation Yes   Intervention Assist the participant in steps to quit. Provide individualized education and counseling about committing to Tobacco Cessation, relapse prevention, and pharmacological support that can be provided by physician.;Advice worker, assist with locating and accessing local/national Quit Smoking programs, and support quit date choice.   Expected Outcomes Long Term: Complete abstinence from all tobacco products for at least 12 months from quit date.;Short Term: Will demonstrate readiness to quit, by selecting a quit date.   Stress Yes   Intervention Offer individual and/or small group education and counseling on adjustment to heart disease, stress management and health-related lifestyle change. Teach and support self-help strategies.;Refer participants experiencing significant psychosocial distress to appropriate mental health specialists for further evaluation and treatment. When possible, include family members and significant others in education/counseling sessions.   Expected Outcomes Short Term: Participant demonstrates changes in health-related behavior, relaxation and other stress management skills, ability to obtain effective social support, and compliance with psychotropic medications if prescribed.;Long Term: Emotional wellbeing is indicated by absence of clinically significant psychosocial distress or  social isolation.   Personal Goal Gain Strength, be where I was before event.    Intervention Attend CR 3 x week and supplement 2 x week at home.    Expected Outcomes Reach personal goals.        Personal Goals Discharge:     Goals and Risk Factor Review    Row Name 04/19/16 1024 05/19/16 0757 06/16/16 1527         Core Components/Risk Factors/Patient Goals Review   Personal Goals Review Increase Strength and Stamina;Stress;Tobacco Cessation Weight Management/Obesity;Increase Strength and Stamina;Tobacco Cessation  Gain Strength, be where  I was before event.  Weight Management/Obesity;Increase Strength and Stamina;Tobacco Cessation  Gain strength; be where I was before event.     Review  - Patient has attended 5 sessions. She is currently hospitalized d/t acute CHF. Will continue to monitor for progress.  Patient has attended 8 sessions. She has been inconsistent in her attendance d/t family issues and health issues. She has had some progression. She continues to smoke. Will continue to monitor.      Expected Outcomes  - Patient will be able to complete the program meeting her personal goals.  Patient will attend more consistently and complete the program meeting her personal goals.         Nutrition & Weight - Outcomes:     Pre Biometrics - 04/19/16 1014      Pre Biometrics   Height 5' (1.524 m)   Weight 116 lb 2.9 oz (52.7 kg)   Waist Circumference 30 inches   Hip Circumference 34.5 inches   Waist to Hip Ratio 0.87 %   BMI (Calculated) 22.7   Triceps Skinfold 14 mm   % Body Fat 30.5 %   Grip Strength 44.3 kg   Flexibility 15.3 in   Single Leg Stand 14 seconds       Nutrition:   Nutrition Discharge:     Nutrition Assessments - 04/19/16 1021      MEDFICTS Scores   Pre Score 49      Education Questionnaire Score:     Knowledge Questionnaire Score - 04/19/16 1019      Knowledge Questionnaire Score   Pre Score 22/28

## 2016-07-15 ENCOUNTER — Encounter (HOSPITAL_COMMUNITY): Payer: PRIVATE HEALTH INSURANCE

## 2016-07-15 ENCOUNTER — Encounter: Payer: Self-pay | Admitting: Neurology

## 2016-07-18 ENCOUNTER — Other Ambulatory Visit: Payer: PRIVATE HEALTH INSURANCE

## 2016-07-18 ENCOUNTER — Encounter (HOSPITAL_COMMUNITY): Payer: PRIVATE HEALTH INSURANCE

## 2016-07-20 ENCOUNTER — Other Ambulatory Visit (INDEPENDENT_AMBULATORY_CARE_PROVIDER_SITE_OTHER): Payer: PRIVATE HEALTH INSURANCE

## 2016-07-20 ENCOUNTER — Encounter: Payer: Self-pay | Admitting: *Deleted

## 2016-07-20 ENCOUNTER — Encounter (HOSPITAL_COMMUNITY): Payer: PRIVATE HEALTH INSURANCE

## 2016-07-20 DIAGNOSIS — Z79899 Other long term (current) drug therapy: Secondary | ICD-10-CM | POA: Diagnosis not present

## 2016-07-21 LAB — BASIC METABOLIC PANEL
BUN/Creatinine Ratio: 14 (ref 9–23)
BUN: 12 mg/dL (ref 6–24)
CO2: 25 mmol/L (ref 18–29)
Calcium: 9.6 mg/dL (ref 8.7–10.2)
Chloride: 100 mmol/L (ref 96–106)
Creatinine, Ser: 0.86 mg/dL (ref 0.57–1.00)
GFR calc Af Amer: 89 mL/min/{1.73_m2} (ref >59)
GFR calc non Af Amer: 77 mL/min/{1.73_m2} (ref >59)
Glucose: 126 mg/dL — ABNORMAL HIGH (ref 65–99)
Potassium: 4.3 mmol/L (ref 3.5–5.2)
Sodium: 140 mmol/L (ref 134–144)

## 2016-07-22 ENCOUNTER — Encounter (HOSPITAL_COMMUNITY): Payer: PRIVATE HEALTH INSURANCE

## 2016-07-25 ENCOUNTER — Encounter (HOSPITAL_COMMUNITY): Payer: PRIVATE HEALTH INSURANCE

## 2016-07-27 ENCOUNTER — Encounter (HOSPITAL_COMMUNITY): Payer: PRIVATE HEALTH INSURANCE

## 2016-07-29 ENCOUNTER — Encounter (HOSPITAL_COMMUNITY): Payer: PRIVATE HEALTH INSURANCE

## 2016-07-29 ENCOUNTER — Ambulatory Visit: Payer: PRIVATE HEALTH INSURANCE | Admitting: Gastroenterology

## 2016-08-01 ENCOUNTER — Encounter (HOSPITAL_COMMUNITY): Payer: PRIVATE HEALTH INSURANCE

## 2016-08-01 ENCOUNTER — Other Ambulatory Visit: Payer: Self-pay | Admitting: Physician Assistant

## 2016-08-01 MED ORDER — CARVEDILOL 3.125 MG PO TABS: 3.1250 mg | ORAL_TABLET | Freq: Two times a day (BID) | ORAL | 11 refills | Status: DC

## 2016-08-03 ENCOUNTER — Telehealth: Payer: Self-pay | Admitting: Cardiovascular Disease

## 2016-08-03 ENCOUNTER — Encounter (HOSPITAL_COMMUNITY): Payer: PRIVATE HEALTH INSURANCE

## 2016-08-03 NOTE — Telephone Encounter (Signed)
New message      Calling to check the status on a form faxed ( baseline accessment form).  It has been faxed twice.  Did we get it?

## 2016-08-03 NOTE — Telephone Encounter (Signed)
Erin [name] is advised that we have received form and that Dr Johnsie Cancel will be back in the office on Friday 3/30. She verbalized understanding and thanked me for calling her back.

## 2016-08-04 ENCOUNTER — Emergency Department (HOSPITAL_COMMUNITY): Payer: PRIVATE HEALTH INSURANCE

## 2016-08-04 ENCOUNTER — Inpatient Hospital Stay (HOSPITAL_COMMUNITY)
Admission: EM | Admit: 2016-08-04 | Discharge: 2016-08-05 | DRG: 303 | Disposition: A | Discharge status: Home / Self Care | Payer: PRIVATE HEALTH INSURANCE | Attending: Internal Medicine | Admitting: Internal Medicine

## 2016-08-04 ENCOUNTER — Other Ambulatory Visit: Payer: Self-pay | Admitting: *Deleted

## 2016-08-04 ENCOUNTER — Encounter (HOSPITAL_COMMUNITY): Payer: Self-pay

## 2016-08-04 ENCOUNTER — Ambulatory Visit: Payer: PRIVATE HEALTH INSURANCE | Admitting: Neurology

## 2016-08-04 DIAGNOSIS — E785 Hyperlipidemia, unspecified: Secondary | ICD-10-CM | POA: Diagnosis present

## 2016-08-04 DIAGNOSIS — Z955 Presence of coronary angioplasty implant and graft: Secondary | ICD-10-CM

## 2016-08-04 DIAGNOSIS — I10 Essential (primary) hypertension: Secondary | ICD-10-CM | POA: Diagnosis not present

## 2016-08-04 DIAGNOSIS — K219 Gastro-esophageal reflux disease without esophagitis: Secondary | ICD-10-CM | POA: Diagnosis present

## 2016-08-04 DIAGNOSIS — I252 Old myocardial infarction: Secondary | ICD-10-CM | POA: Diagnosis not present

## 2016-08-04 DIAGNOSIS — I110 Hypertensive heart disease with heart failure: Secondary | ICD-10-CM | POA: Diagnosis present

## 2016-08-04 DIAGNOSIS — J449 Chronic obstructive pulmonary disease, unspecified: Secondary | ICD-10-CM | POA: Diagnosis present

## 2016-08-04 DIAGNOSIS — Z7982 Long term (current) use of aspirin: Secondary | ICD-10-CM | POA: Diagnosis not present

## 2016-08-04 DIAGNOSIS — I255 Ischemic cardiomyopathy: Secondary | ICD-10-CM | POA: Diagnosis present

## 2016-08-04 DIAGNOSIS — R079 Chest pain, unspecified: Secondary | ICD-10-CM | POA: Diagnosis present

## 2016-08-04 DIAGNOSIS — F411 Generalized anxiety disorder: Secondary | ICD-10-CM | POA: Diagnosis present

## 2016-08-04 DIAGNOSIS — Z87891 Personal history of nicotine dependence: Secondary | ICD-10-CM

## 2016-08-04 DIAGNOSIS — Z23 Encounter for immunization: Secondary | ICD-10-CM

## 2016-08-04 DIAGNOSIS — I200 Unstable angina: Secondary | ICD-10-CM | POA: Diagnosis not present

## 2016-08-04 DIAGNOSIS — I5042 Chronic combined systolic (congestive) and diastolic (congestive) heart failure: Secondary | ICD-10-CM | POA: Diagnosis present

## 2016-08-04 DIAGNOSIS — I25110 Atherosclerotic heart disease of native coronary artery with unstable angina pectoris: Secondary | ICD-10-CM | POA: Diagnosis present

## 2016-08-04 DIAGNOSIS — Z79899 Other long term (current) drug therapy: Secondary | ICD-10-CM | POA: Diagnosis not present

## 2016-08-04 DIAGNOSIS — E876 Hypokalemia: Secondary | ICD-10-CM | POA: Diagnosis present

## 2016-08-04 DIAGNOSIS — I340 Nonrheumatic mitral (valve) insufficiency: Secondary | ICD-10-CM | POA: Diagnosis present

## 2016-08-04 DIAGNOSIS — E782 Mixed hyperlipidemia: Secondary | ICD-10-CM | POA: Diagnosis present

## 2016-08-04 DIAGNOSIS — I2510 Atherosclerotic heart disease of native coronary artery without angina pectoris: Secondary | ICD-10-CM | POA: Diagnosis present

## 2016-08-04 LAB — HEPARIN LEVEL (UNFRACTIONATED)
Heparin Unfractionated: 0.1 IU/mL — ABNORMAL LOW (ref 0.30–0.70)
Heparin Unfractionated: 0.43 IU/mL (ref 0.30–0.70)

## 2016-08-04 LAB — TSH: TSH: 0.944 u[IU]/mL (ref 0.350–4.500)

## 2016-08-04 LAB — BASIC METABOLIC PANEL
Anion gap: 5 (ref 5–15)
BUN: 13 mg/dL (ref 6–20)
CO2: 24 mmol/L (ref 22–32)
Calcium: 8.5 mg/dL — ABNORMAL LOW (ref 8.9–10.3)
Chloride: 106 mmol/L (ref 101–111)
Creatinine, Ser: 0.85 mg/dL (ref 0.44–1.00)
GFR calc Af Amer: >60 mL/min (ref >60)
GFR calc non Af Amer: >60 mL/min (ref >60)
Glucose, Bld: 101 mg/dL — ABNORMAL HIGH (ref 65–99)
Potassium: 4.3 mmol/L (ref 3.5–5.1)
Sodium: 135 mmol/L (ref 135–145)

## 2016-08-04 LAB — MAGNESIUM: Magnesium: 1.9 mg/dL (ref 1.7–2.4)

## 2016-08-04 LAB — TROPONIN I
Troponin I: 0.03 ng/mL (ref <0.03)
Troponin I: <0.03 ng/mL (ref <0.03)
Troponin I: <0.03 ng/mL (ref <0.03)
Troponin I: <0.03 ng/mL (ref <0.03)

## 2016-08-04 LAB — COMPREHENSIVE METABOLIC PANEL
ALT: 16 U/L (ref 14–54)
AST: 18 U/L (ref 15–41)
Albumin: 3.6 g/dL (ref 3.5–5.0)
Alkaline Phosphatase: 62 U/L (ref 38–126)
Anion gap: 9 (ref 5–15)
BUN: 13 mg/dL (ref 6–20)
CO2: 26 mmol/L (ref 22–32)
Calcium: 9.3 mg/dL (ref 8.9–10.3)
Chloride: 103 mmol/L (ref 101–111)
Creatinine, Ser: 0.72 mg/dL (ref 0.44–1.00)
GFR calc Af Amer: >60 mL/min (ref >60)
GFR calc non Af Amer: >60 mL/min (ref >60)
Glucose, Bld: 102 mg/dL — ABNORMAL HIGH (ref 65–99)
Potassium: 2.6 mmol/L — CL (ref 3.5–5.1)
Sodium: 138 mmol/L (ref 135–145)
Total Bilirubin: 0.8 mg/dL (ref 0.3–1.2)
Total Protein: 6.4 g/dL — ABNORMAL LOW (ref 6.5–8.1)

## 2016-08-04 LAB — CBC WITH DIFFERENTIAL/PLATELET
Basophils Absolute: 0 10*3/uL (ref 0.0–0.1)
Basophils Relative: 0 %
Eosinophils Absolute: 0.1 10*3/uL (ref 0.0–0.7)
Eosinophils Relative: 1 %
HCT: 33.2 % — ABNORMAL LOW (ref 36.0–46.0)
Hemoglobin: 11.3 g/dL — ABNORMAL LOW (ref 12.0–15.0)
Lymphocytes Relative: 33 %
Lymphs Abs: 3 10*3/uL (ref 0.7–4.0)
MCH: 27.8 pg (ref 26.0–34.0)
MCHC: 34 g/dL (ref 30.0–36.0)
MCV: 81.8 fL (ref 78.0–100.0)
Monocytes Absolute: 0.7 10*3/uL (ref 0.1–1.0)
Monocytes Relative: 8 %
Neutro Abs: 5.3 10*3/uL (ref 1.7–7.7)
Neutrophils Relative %: 58 %
Platelets: 378 10*3/uL (ref 150–400)
RBC: 4.06 MIL/uL (ref 3.87–5.11)
RDW: 16.3 % — ABNORMAL HIGH (ref 11.5–15.5)
WBC: 9.1 10*3/uL (ref 4.0–10.5)

## 2016-08-04 LAB — LIPASE, BLOOD: Lipase: 36 U/L (ref 11–51)

## 2016-08-04 LAB — MRSA PCR SCREENING: MRSA by PCR: NEGATIVE

## 2016-08-04 LAB — APTT: aPTT: 32 seconds (ref 24–36)

## 2016-08-04 LAB — PROTIME-INR
INR: 0.96
Prothrombin Time: 12.8 seconds (ref 11.4–15.2)

## 2016-08-04 LAB — BRAIN NATRIURETIC PEPTIDE: B Natriuretic Peptide: 332 pg/mL — ABNORMAL HIGH (ref 0.0–100.0)

## 2016-08-04 LAB — POTASSIUM: Potassium: 3.7 mmol/L (ref 3.5–5.1)

## 2016-08-04 MED ORDER — GI COCKTAIL ~~LOC~~
30.0000 mL | Freq: Once | ORAL | Status: AC
  Administered 2016-08-04: 30 mL via ORAL
  Filled 2016-08-04: qty 30

## 2016-08-04 MED ORDER — NITROGLYCERIN 0.4 MG SL SUBL: 0.4000 mg | SUBLINGUAL_TABLET | SUBLINGUAL | Status: DC | PRN

## 2016-08-04 MED ORDER — ONDANSETRON HCL 4 MG/2ML IJ SOLN: 4.0000 mg | Freq: Four times a day (QID) | INTRAMUSCULAR | Status: DC | PRN

## 2016-08-04 MED ORDER — PANTOPRAZOLE SODIUM 20 MG PO TBEC
20.0000 mg | DELAYED_RELEASE_TABLET | Freq: Two times a day (BID) | ORAL | Status: DC
  Filled 2016-08-04 (×3): qty 1

## 2016-08-04 MED ORDER — CARVEDILOL 3.125 MG PO TABS
3.1250 mg | ORAL_TABLET | Freq: Two times a day (BID) | ORAL | Status: DC
  Administered 2016-08-04 – 2016-08-05 (×2): 3.125 mg via ORAL
  Filled 2016-08-04 (×3): qty 1

## 2016-08-04 MED ORDER — CARVEDILOL 3.125 MG PO TABS: 3.1250 mg | ORAL_TABLET | Freq: Two times a day (BID) | ORAL | 11 refills | Status: DC

## 2016-08-04 MED ORDER — HEPARIN BOLUS VIA INFUSION
3000.0000 [IU] | Freq: Once | INTRAVENOUS | Status: AC
  Administered 2016-08-04: 3000 [IU] via INTRAVENOUS

## 2016-08-04 MED ORDER — NITROGLYCERIN 2 % TD OINT
1.0000 [in_us] | TOPICAL_OINTMENT | Freq: Once | TRANSDERMAL | Status: AC
  Administered 2016-08-04: 1 [in_us] via TOPICAL
  Filled 2016-08-04: qty 1

## 2016-08-04 MED ORDER — CLOPIDOGREL BISULFATE 75 MG PO TABS
75.0000 mg | ORAL_TABLET | Freq: Every day | ORAL | Status: DC
  Administered 2016-08-04 – 2016-08-05 (×2): 75 mg via ORAL
  Filled 2016-08-04 (×2): qty 1

## 2016-08-04 MED ORDER — POTASSIUM CHLORIDE 10 MEQ/100ML IV SOLN
10.0000 meq | INTRAVENOUS | Status: AC
  Administered 2016-08-04: 10 meq via INTRAVENOUS
  Filled 2016-08-04 (×2): qty 100

## 2016-08-04 MED ORDER — NITROGLYCERIN IN D5W 200-5 MCG/ML-% IV SOLN
5.0000 ug/min | Freq: Once | INTRAVENOUS | Status: AC
  Administered 2016-08-04: 5 ug/min via INTRAVENOUS
  Filled 2016-08-04: qty 250

## 2016-08-04 MED ORDER — ATORVASTATIN CALCIUM 40 MG PO TABS
80.0000 mg | ORAL_TABLET | Freq: Every day | ORAL | Status: DC
  Administered 2016-08-04 – 2016-08-05 (×2): 80 mg via ORAL
  Filled 2016-08-04 (×2): qty 2

## 2016-08-04 MED ORDER — LORAZEPAM 2 MG/ML IJ SOLN
1.0000 mg | Freq: Once | INTRAMUSCULAR | Status: AC
  Administered 2016-08-04: 1 mg via INTRAVENOUS
  Filled 2016-08-04: qty 1

## 2016-08-04 MED ORDER — POTASSIUM CHLORIDE CRYS ER 20 MEQ PO TBCR
40.0000 meq | EXTENDED_RELEASE_TABLET | Freq: Once | ORAL | Status: AC
  Administered 2016-08-04: 40 meq via ORAL
  Filled 2016-08-04: qty 2

## 2016-08-04 MED ORDER — FUROSEMIDE 10 MG/ML IJ SOLN
10.0000 mg | Freq: Once | INTRAMUSCULAR | Status: AC
Start: 2016-08-04 — End: 2016-08-04
  Administered 2016-08-04: 10 mg via INTRAVENOUS
  Filled 2016-08-04: qty 2

## 2016-08-04 MED ORDER — ACETAMINOPHEN 500 MG PO TABS
500.0000 mg | ORAL_TABLET | Freq: Three times a day (TID) | ORAL | Status: DC | PRN
Start: 2016-08-04 — End: 2016-08-05
  Administered 2016-08-05: 500 mg via ORAL
  Filled 2016-08-04: qty 1

## 2016-08-04 MED ORDER — ASPIRIN EC 81 MG PO TBEC
81.0000 mg | DELAYED_RELEASE_TABLET | Freq: Every day | ORAL | Status: DC
  Administered 2016-08-04 – 2016-08-05 (×2): 81 mg via ORAL
  Filled 2016-08-04 (×2): qty 1

## 2016-08-04 MED ORDER — FUROSEMIDE 40 MG PO TABS
40.0000 mg | ORAL_TABLET | Freq: Every day | ORAL | Status: DC
  Administered 2016-08-05: 40 mg via ORAL
  Filled 2016-08-04: qty 1

## 2016-08-04 MED ORDER — PANTOPRAZOLE SODIUM 40 MG PO TBEC
40.0000 mg | DELAYED_RELEASE_TABLET | Freq: Two times a day (BID) | ORAL | Status: DC
  Administered 2016-08-04 – 2016-08-05 (×3): 40 mg via ORAL
  Filled 2016-08-04 (×3): qty 1

## 2016-08-04 MED ORDER — ONDANSETRON HCL 4 MG PO TABS: 4.0000 mg | ORAL_TABLET | Freq: Four times a day (QID) | ORAL | Status: DC | PRN

## 2016-08-04 MED ORDER — ONDANSETRON HCL 4 MG/2ML IJ SOLN
4.0000 mg | Freq: Once | INTRAMUSCULAR | Status: AC
  Administered 2016-08-04: 4 mg via INTRAVENOUS

## 2016-08-04 MED ORDER — FUROSEMIDE 40 MG PO TABS: 40.0000 mg | ORAL_TABLET | Freq: Every day | ORAL | Status: DC

## 2016-08-04 MED ORDER — SODIUM CHLORIDE 0.9% FLUSH
3.0000 mL | Freq: Two times a day (BID) | INTRAVENOUS | Status: DC
  Administered 2016-08-04 – 2016-08-05 (×3): 3 mL via INTRAVENOUS

## 2016-08-04 MED ORDER — PNEUMOCOCCAL VAC POLYVALENT 25 MCG/0.5ML IJ INJ
0.5000 mL | INJECTION | INTRAMUSCULAR | Status: AC
  Administered 2016-08-05: 0.5 mL via INTRAMUSCULAR
  Filled 2016-08-04: qty 0.5

## 2016-08-04 MED ORDER — MORPHINE SULFATE (PF) 4 MG/ML IV SOLN
4.0000 mg | Freq: Once | INTRAVENOUS | Status: AC
  Administered 2016-08-04: 4 mg via INTRAVENOUS
  Filled 2016-08-04: qty 1

## 2016-08-04 MED ORDER — LORAZEPAM 0.5 MG PO TABS
0.5000 mg | ORAL_TABLET | Freq: Three times a day (TID) | ORAL | Status: DC | PRN
  Administered 2016-08-04: 0.5 mg via ORAL
  Filled 2016-08-04: qty 1

## 2016-08-04 MED ORDER — MORPHINE SULFATE (PF) 4 MG/ML IV SOLN
4.0000 mg | Freq: Once | INTRAVENOUS | Status: AC
  Administered 2016-08-04: 4 mg via INTRAVENOUS
  Filled 2016-08-04 (×2): qty 1

## 2016-08-04 MED ORDER — HEPARIN (PORCINE) IN NACL 100-0.45 UNIT/ML-% IJ SOLN
750.0000 [IU]/h | INTRAMUSCULAR | Status: DC
  Administered 2016-08-04: 600 [IU]/h via INTRAVENOUS
  Administered 2016-08-05: 750 [IU]/h via INTRAVENOUS
  Filled 2016-08-04 (×2): qty 250

## 2016-08-04 MED ORDER — LOSARTAN POTASSIUM 25 MG PO TABS
12.5000 mg | ORAL_TABLET | Freq: Every evening | ORAL | Status: DC
  Administered 2016-08-04: 12.5 mg via ORAL
  Filled 2016-08-04 (×4): qty 0.5

## 2016-08-04 MED ORDER — HEPARIN BOLUS VIA INFUSION
2000.0000 [IU] | Freq: Once | INTRAVENOUS | Status: AC
  Administered 2016-08-04: 2000 [IU] via INTRAVENOUS
  Filled 2016-08-04: qty 2000

## 2016-08-04 MED ORDER — POTASSIUM CHLORIDE CRYS ER 20 MEQ PO TBCR
20.0000 meq | EXTENDED_RELEASE_TABLET | Freq: Every day | ORAL | Status: DC
  Administered 2016-08-04 – 2016-08-05 (×2): 20 meq via ORAL
  Filled 2016-08-04 (×3): qty 1

## 2016-08-04 MED ORDER — FAMOTIDINE 20 MG PO TABS
20.0000 mg | ORAL_TABLET | Freq: Two times a day (BID) | ORAL | Status: DC
  Administered 2016-08-04 – 2016-08-05 (×3): 20 mg via ORAL
  Filled 2016-08-04 (×3): qty 1

## 2016-08-04 MED ORDER — ONDANSETRON HCL 4 MG/2ML IJ SOLN
INTRAMUSCULAR | Status: AC
  Filled 2016-08-04: qty 2

## 2016-08-04 NOTE — ED Notes (Signed)
CRITICAL VALUE ALERT  Critical value received: Potassium 2.6  Date of notification: 08/04/2016  Time of notification:  0336  Critical value read back: yes  Nurse who received alert:  Natividad Brood, RN  MD notified (1st page): Dr. Wyvonnia Dusky  Time of first page:  [phone]  MD notified (2nd page):  Time of second page:  Responding MD:  Dr. Wyvonnia Dusky  Time MD responded:  [phone]

## 2016-08-04 NOTE — Progress Notes (Signed)
Erin [name] is an 56 y.o. female with hx of ischemic CMP, prior NSTEMI, known CAD s/p 2 DES stent placements, hx of anxiety, HTN, HLD, GERD, combined CHF, EF 30-35%, presents to the ER with chest pain for several hours. She was started on IV heparin and IV nitro, reports chest pain resolved. Admitted to step down for closer monitoring. Cardiology consulted, recommended to follow up on troponins.   Hosie Poisson, MD [phone]

## 2016-08-04 NOTE — ED Notes (Signed)
Pt back from xray. Pt rates her pressure as a 7 at this time. Pt still c/o nausea. Will continue to monitor pt.

## 2016-08-04 NOTE — Consult Note (Signed)
Cardiology Consultation   Patient ID: RANE DUMM; [id number]; 1961/03/27   Admit date: 08/04/2016 Date of Consult: 08/04/2016  Referring MD: Dr. Karleen Hampshire Cardiologist: previous Dr. Radford Pax but now Dr. Johnsie Cancel or Bronson Ing in [place]  Patient Care Team: Maurice Small, MD as PCP - General (Family Medicine)    Reason for Consultation: chest pain   History of Present Illness: Erin [name] is a 56 y.o. female with a hx of CAD NSTEMI 03/2016 DESplaced over previously placed stent prox-med CFX. And staged DES RCA right PDA 03/14/16. Cath showed severe MR but echo EF 30-35% grade 2 DD and mild MR. ICM, HTN, previous smoker. Multiple admissions since then for lightheadedness/weakness, dyspnea secondary to anxiety, and CHF. Echo 05/2016 EF 35-40%, small to mod pericardial effusion. ER 06/2016 chest pain ruled out.   Saw Nell Range PA 07/07/16 complaining of shortness of breath and chronic chest pain. BNP 910 so Lasix ws increased.  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 No exertional chest pain. She awakened at 10 pm with aching chest pain, nausea, dyspnea and tingling in her hands and feet. She says it felt like when she was going into heart failure. Hasn't really had any chest tightness or pain like her MI. Still smoking 4 cig/day.Denies excess sodium. K 2.6 troponin negative x 2 BNP 332. EKG without acute change.  Past Medical History:  Diagnosis Date  . Anxiety state 03/25/2016  . Basal cell carcinoma of forehead   . CHF (congestive heart failure) ([place])   . Coronary artery disease    a. 03/11/16 PCI with DES-->Prox/Mid Cx, 03/14/16 PCI with DES x2-->RCA, EF 30-35%  . Essential hypertension   . GERD (gastroesophageal reflux disease)   . History of pneumonia   . Hyperlipidemia   . Ischemic cardiomyopathy   . Mitral regurgitation   . NSTEMI (non-ST elevated myocardial infarction) ([facility]) 03/10/2016  .  Squamous cell cancer of skin of nose   . Thrombocytosis ([place]) 03/26/2016  . Wears dentures   . Wears glasses     Past Surgical History:  Procedure Laterality Date  . APPENDECTOMY    . CARDIAC CATHETERIZATION N/A 03/11/2016   Procedure: Left Heart Cath and Coronary Angiography;  Surgeon: Leonie Man, MD;  Location: [facility];  Service: Cardiovascular;  Laterality: N/A;  . CARDIAC CATHETERIZATION N/A 03/11/2016   Procedure: Coronary Stent Intervention;  Surgeon: Leonie Man, MD;  Location: [facility];  Service: Cardiovascular;  Laterality: N/A;  . CARDIAC CATHETERIZATION N/A 03/14/2016   Procedure: Coronary Stent Intervention;  Surgeon: Peter M Martinique, MD;  Location: [facility];  Service: Cardiovascular;  Laterality: N/A;  . CHOLECYSTECTOMY OPEN  1984  . CORONARY ANGIOPLASTY WITH STENT PLACEMENT  03/14/2016  . FINGER ARTHROPLASTY Left 05/14/2013   Procedure: LEFT THUMB CARPAL METACARPAL ARTHROPLASTY;  Surgeon: Tennis Must, MD;  Location: [facility];  Service: Orthopedics;  Laterality: Left;  . TUBAL LIGATION  1987  . VAGINAL HYSTERECTOMY  2009      Home Meds: Prior to Admission medications   Medication Sig Start Date End Date Taking? Authorizing Provider  acetaminophen (TYLENOL) 500 MG tablet Take 1 tablet (500 mg total) by mouth every 8 (eight) hours as needed for mild pain, moderate pain or headache. 03/15/16   Cheryln Manly, NP  aspirin EC 81 MG tablet Take 81 mg by mouth daily.    Historical Provider,  MD  atorvastatin (LIPITOR) 80 MG tablet Take 1 tablet (80 mg total) by mouth daily. 07/07/16 10/05/16  Eileen Stanford, PA-C  carvedilol (COREG) 3.125 MG tablet Take 1 tablet (3.125 mg total) by mouth 2 (two) times daily with a meal. 08/01/16   Eileen Stanford, PA-C  clopidogrel (PLAVIX) 75 MG tablet Take 1 tablet (75 mg total) by mouth daily. 06/02/16   Lendon Colonel, NP  furosemide (LASIX) 20 MG tablet Take 2 tablets (40 mg total)  by mouth daily. 07/08/16   Eileen Stanford, PA-C  LORazepam (ATIVAN) 0.5 MG tablet Take 1 tablet (0.5 mg total) by mouth every 12 (twelve) hours as needed for anxiety. DO NOT DRIVE WHEN TAKING THIS MEDICATION. 03/26/16   Rexene Alberts, MD  losartan (COZAAR) 25 MG tablet Take 0.5 tablets (12.5 mg total) by mouth every evening. 05/20/16   Thurnell Lose, MD  nitroGLYCERIN (NITROSTAT) 0.4 MG SL tablet Place 1 tablet (0.4 mg total) under the tongue every 5 (five) minutes x 3 doses as needed for chest pain. 09/06/15   Almyra Deforest, PA  pantoprazole (PROTONIX) 20 MG tablet Take 20 mg by mouth 2 (two) times daily.    Historical Provider, MD  potassium chloride SA (K-DUR,KLOR-CON) 20 MEQ tablet Take 1 tablet (20 mEq total) by mouth daily. 06/20/16   Kathie Dike, MD  ranitidine (ZANTAC) 150 MG tablet Take 1 tablet (150 mg total) by mouth 2 (two) times daily. 06/20/16   Kathie Dike, MD    Current Medications: . aspirin EC  81 mg Oral Daily  . atorvastatin  80 mg Oral Daily  . carvedilol  3.125 mg Oral BID WC  . clopidogrel  75 mg Oral Daily  . famotidine  20 mg Oral BID  . furosemide  40 mg Oral Daily  . losartan  12.5 mg Oral QPM  . pantoprazole  40 mg Oral BID AC  . [START ON 08/05/2016] pneumococcal 23 valent vaccine  0.5 mL Intramuscular Tomorrow-1000  . potassium chloride SA  20 mEq Oral Daily  . sodium chloride flush  3 mL Intravenous Q12H     Allergies:   No Known Allergies  Social History:   The patient  reports that she has quit smoking. Her smoking use included Cigarettes. She has a 15.00 pack-year smoking history. She has never used smokeless tobacco. She reports that she drinks about 3.6 oz of alcohol per week . She reports that she uses drugs, including Marijuana.    Family History:   The patient's family history includes Diabetes in her mother; Heart failure in her other; Hypertension in her mother; Stroke in her mother.   ROS:  Please see the history of present illness.  Review of  Systems  Constitution: Negative.  HENT: Negative.   Eyes: Negative.   Cardiovascular: Positive for chest pain and dyspnea on exertion.  Respiratory: Positive for cough, shortness of breath and wheezing.   Hematologic/Lymphatic: Negative.   Musculoskeletal: Negative.  Negative for joint pain.  Gastrointestinal: Negative.   Genitourinary: Negative.   Neurological: Positive for paresthesias.   All other ROS reviewed and negative.      Vital Signs: Blood pressure 94/63, pulse 79, temperature 98.1 F (36.7 C), temperature source Oral, resp. rate 17, height 5' (1.524 m), weight 112 lb 3.4 oz (50.9 kg), SpO2 94 %.   PHYSICAL EXAM: General:  Well nourished, well developed, in no acute distress  HEENT: normal Lymph: no adenopathy Neck: slight increase JVD Endocrine:  No thryomegaly Vascular:  No carotid bruits; FA pulses 2+ bilaterally without bruits  Cardiac:  RRR; normal S1, S2; no murmur, rub, bruit, thrill, or heave Lungs:  Decreased breath sounds with scattered wheezes and rhonchi Abd: soft, nontender, no hepatomegaly  Ext: no edema, Good distal pulses bilaterally Musculoskeletal:  No deformities, BUE and BLE strength normal and equal Skin: warm and dry  Neuro:  CNs 2-12 intact, no focal abnormalities noted Psych:  Normal affect    EKG:  NSR with nonspecific ST T changes no acute change personally reviewed.  Telemetry: NSR personally reviewed  Labs:  Recent Labs  08/04/16 0250 08/04/16 0632  TROPONINI 0.03* <0.03   Lab Results  Component Value Date   WBC 9.1 08/04/2016   HGB 11.3 (L) 08/04/2016   HCT 33.2 (L) 08/04/2016   MCV 81.8 08/04/2016   PLT 378 08/04/2016    Recent Labs Lab 08/04/16 0250  NA 138  K 2.6*  CL 103  CO2 26  BUN 13  CREATININE 0.72  CALCIUM 9.3  PROT 6.4*  BILITOT 0.8  ALKPHOS 62  ALT 16  AST 18  GLUCOSE 102*   Lab Results  Component Value Date   CHOL 107 05/17/2016   HDL 41 05/17/2016   LDLCALC 32 05/17/2016   TRIG 168 (H)  05/17/2016   Lab Results  Component Value Date   DDIMER 0.25 07/07/2016    Radiology/Studies:  Dg Chest 2 View  Result Date: 08/04/2016 CLINICAL DATA:  56 y/o  F; chest pressure with shortness of breath. EXAM: CHEST  2 VIEW COMPARISON:  07/05/2014 chest radiograph. FINDINGS: Stable heart size and mediastinal contours are within normal limits. Both lungs are clear. Mild degenerative changes of the thoracic spine. Right upper quadrant cholecystectomy clips. IMPRESSION: No acute pulmonary process identified. Electronically Signed   By: Kristine Garbe M.D.   On: 08/04/2016 03:39   Dg Chest 2 View  Result Date: 07/05/2016 CLINICAL DATA:  Chest pressure and heaviness with shortness of breath for the past 2 weeks with increased symptoms recently. Patient recently began Plavix. History of MI with 3 stents placed in March 28 2016. EXAM: CHEST  2 VIEW COMPARISON:  PA and lateral chest x-ray of June 20, 2016 FINDINGS: The lungs are mildly hyperinflated and clear. The heart and pulmonary vascularity are normal. Coronary artery stents are visible. The mediastinum is normal in width. There is calcification in the wall of the aortic arch. The bony thorax exhibits no acute abnormality. IMPRESSION: Hyperinflation consistent with COPD or reactive airway disease. No CHF nor pneumonia nor other acute cardiopulmonary abnormality. Thoracic aortic atherosclerosis. Electronically Signed   By: David  Martinique M.D.   On: 07/05/2016 11:49   Cath 03/11/16 Procedures  Coronary Stent Intervention  Left Heart Cath and Coronary Angiography  Conclusion       Prox-Mid Cx lesion, 100 %stenosed.  A STENT SYNERGY DES 3X12 drug eluting stent was successfully placed, and overlaps previously placed stent.  Post intervention, there is a 0% residual stenosis.  Dist RCA lesion, 80 %stenosed. - Would consider treatment in a staged fashion, could either be done during this hospitalization or as an outpatient.  The  left ventricular ejection fraction is 45-50% by visual estimate.  There is severe (4+) mitral regurgitation.  LV end diastolic pressure is severely elevated.     Pathophysiologically, this is equivalent to a non-ST elevation MI, however with no EKG changes, is technically non-STEMI.   She will be admitted to [facility] post procedure unit/stepdown. Was chest pain-free upon  completion of the procedure.   Plan:  TR band removal per protocol  Continue Angiomax until current bag completed  Nitroglycerin infusion weaned from 40 g/min down to 20. Would continue to wean overnight.  1dose IV Lasix 40 mg for elevated LVEDP and MR  Order home dose metoprolol for this evening, and start ACE inhibitor tomorrow.  Dual antiplatelet therapy for minimum of 3 months, could potentially consider stopping aspirin after 3 months  We'll need 2-D echocardiogram to clarify extent of MR        1.Echocardiogram 05/17/2016 Left ventricle: The cavity size was normal. Wall thickness was   increased in a pattern of mild LVH. Systolic function was   moderately reduced. The estimated ejection fraction was in the   range of 35% to 40%. There is akinesis of the   basal-midinferolateral, inferior, and inferoseptal myocardium.   Doppler parameters are consistent with abnormal left ventricular   relaxation (grade 1 diastolic dysfunction). - Aortic valve: Mildly calcified annulus. Trileaflet. - Mitral valve: There was mild regurgitation. - Tricuspid valve: There was trivial regurgitation. - Pulmonary arteries: PA peak pressure: 22 mm Hg (S). - Pericardium, extracardiac: A small to moderate pericardial   effusion was identified circumferential to the heart. No obvious   right ventricular compromise. Mitral inflow with respiration does   not suggest definitive tamponade physiology.   Cardiac Cath 03/14/2016 Conclusion       Mid Cx lesion, 0 %stenosed.  A drug eluting .  A STENT PROMUS PREM MR 2.25X16 drug  eluting stent was successfully placed.  A STENT PROMUS PREM MR 2.25X8 drug-eluting stent was successfully placed.  Dist RCA lesion, 60 %stenosed.  Post intervention, there is a 0% residual stenosis.  Post Atrio lesion, 0 %stenosed.  Post intervention, there is a 0% residual stenosis.  A stent was successfully placed.   1. Successful Stenting of the distal RCA/PDA with DES x 2.      PROBLEM LIST:  Principal Problem:   Chest pain Active Problems:   Essential hypertension   Hyperlipidemia   GERD (gastroesophageal reflux disease)   Cardiomyopathy, ischemic   CAD (coronary artery disease)   Chronic combined systolic and diastolic CHF (congestive heart failure) ([facility])     ASSESSMENT AND PLAN:  Chest pain sounds musculoskeletal from pulling up carpet. EKG without acute changes, nonspecific troponin trend thus far.   CAD NSTEMI 03/2016 DES placed over previously placed stent prox-med CFX. And staged DES RCA right PDA 03/14/16. Continue Plavix and ASA and lipitor  ICM LVEF 35-40% on echo 05/2016 BP low so can't titrate coreg.  Chronic combined systolic and diastolic CHF neck veins slightly elevated and BNP 332. Will give one dose IV lasix this am. Replace potassium. Repeat bmet later today.  GERD  Anxiety  COPD smoking cessation essential.   Signed, Ermalinda Barrios, PA-C  08/04/2016 8:57 AM   Attending note Patient seen and discussed with PA Bonnell Public, I agree with her documentation above. 56 yo female history of CAD with multiple stents most recently 03/2016 DES to LCX and RCA, HTN, chronic systolic HF LVEF 18-84%, with multiple prior admissions with hypertensive urgency and multiple evaluations for chronic chest pain.   Chest pain started last night around 10pm while in bed. Squeezing feeling mid chest 8/10, some SOB and nausea. Lasted 6 hrs constantly. Not positional, no related to food.   ER vitals:  Jan 2018 echo: LVEF 35-40%, multiple WMAs, grade I diastoilc  dysfunction, small to moderate pericardial effusion.  Hgb 11/3  Plt 378 K 2.6 Cr 0.72 BNP 332 Mg 1.9 TSH 0.944   Trop 0.03--> CXR no acute process EKG SR, no ischemic changes  Patient presents with chest pain early this AM. She is still early in her initial evaluation. Continue to cycle enzymes and follow up echo, depending on results will consider possible repeat ischemic testing. Likely lexiscan for tomorrow AM, however if objective evidence of ischemia would consider cath. Continue current medical therapy with ASA 81, atorva 80, plavix 75, hep gtt, losartan. Of note she had ran out of coreg at home.    Zandra Abts MD

## 2016-08-04 NOTE — Progress Notes (Signed)
ANTICOAGULATION CONSULT NOTE - Follow Up Consult  Pharmacy Consult for Heparin Indication: chest pain/ACS  No Known Allergies  Patient Measurements: Height: 5' (152.4 cm) Weight: 112 lb 3.4 oz (50.9 kg) IBW/kg (Calculated) : 45.5 HEPARIN DW (KG): 50.9  Vital Signs: Temp: 97.4 F (36.3 C) (03/29 1122) Temp Source: Oral (03/29 1122) BP: 102/75 (03/29 1000) Pulse Rate: 66 (03/29 1000)  Labs:  Recent Labs  08/04/16 0250 08/04/16 0632 08/04/16 1122  HGB 11.3*  --   --   HCT 33.2*  --   --   PLT 378  --   --   APTT 32  --   --   LABPROT 12.8  --   --   INR 0.96  --   --   HEPARINUNFRC  --   --  0.10*  CREATININE 0.72  --   --   TROPONINI 0.03* <0.03  --    Estimated Creatinine Clearance: 57.7 mL/min (by C-G formula based on SCr of 0.72 mg/dL).  Medications:  Scheduled:  . aspirin EC  81 mg Oral Daily  . atorvastatin  80 mg Oral Daily  . carvedilol  3.125 mg Oral BID WC  . clopidogrel  75 mg Oral Daily  . famotidine  20 mg Oral BID  . [START ON 08/05/2016] furosemide  40 mg Oral Daily  . losartan  12.5 mg Oral QPM  . pantoprazole  40 mg Oral BID AC  . [START ON 08/05/2016] pneumococcal 23 valent vaccine  0.5 mL Intramuscular Tomorrow-1000  . potassium chloride SA  20 mEq Oral Daily  . sodium chloride flush  3 mL Intravenous Q12H   Assessment: Okay for Protocol, heparin level below goal.  No bleeding noted.  Goal of Therapy:  Heparin level 0.3-0.7 units/ml Monitor platelets by anticoagulation protocol: Yes   Plan:  Give 2000 units bolus x 1 Increase heparin infusion at 750 units/hr Re-Check anti-Xa level in 6-8 hours and daily while on heparin Continue to monitor H&H and platelets  Pricilla Larsson 08/04/2016,12:11 PM

## 2016-08-04 NOTE — ED Notes (Signed)
Pt resting. Pt stating chest pressure/tightness is at a 5. Pt seems less anxious and is able to close her eyes and rest.

## 2016-08-04 NOTE — Progress Notes (Signed)
Dr. Harl Bowie is on floor and I verbally gave consult request. Placed on treatment team list.

## 2016-08-04 NOTE — Telephone Encounter (Signed)
PT IS IN HOSPITAL RIGHT NOW, REQUESTED REFILL FOR CARVEDILOL 3.125 MG, WILL WAIT UNTIL PT IS RELEASED.
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
## 2016-08-04 NOTE — Care Management Note (Signed)
Case Management Note  Patient Details  Name: Erin [name] MRN: [id number] Date of Birth: 1960-07-26  Subjective/Objective: CM reviewed chart for needs. Patient ind with ADL's, still works. Has PCP and insurance with prescription coverage.                 Action/Plan: Cardiac work up under way. Possible lexiscan tomorrow.  Anticipate no CM needs but will follow.   Expected Discharge Date:       08/05/2016           Expected Discharge Plan:  Home/Self Care  In-House Referral:     Discharge planning Services  CM Consult  Post Acute Care Choice:    Choice offered to:     DME Arranged:    DME Agency:     HH Arranged:    HH Agency:     Status of Service:  In process, will continue to follow  If discussed at Long Length of Stay Meetings, dates discussed:    Additional Comments:  Seaver Machia, Chauncey Reading, RN 08/04/2016, 11:33 AM

## 2016-08-04 NOTE — ED Notes (Signed)
CRITICAL VALUE ALERT  Critical value received:  Troponin 0.03  Date of notification:  08/04/2016  Time of notification:  0336  Critical value read back: yes  Nurse who received alert:  Natividad Brood, RN  MD notified (1st page):  Dr. Wyvonnia Dusky  Time of first page: [phone]  MD notified (2nd page):  Time of second page:  Responding MD: Dr. Wyvonnia Dusky  Time MD responded:  [phone]

## 2016-08-04 NOTE — ED Triage Notes (Signed)
Chest pressure started last night approx 10 pm with some sob.  Pt took 2 of her own nitro at home without relief.

## 2016-08-04 NOTE — H&P (Signed)
History and Physical    LILYMARIE SCROGGINS ZYS:[id number] DOB: 1960/10/09 DOA: 08/04/2016  PCP: Jonathon Bellows, MD  Patient coming from: Home.    Chief Complaint:  Chest pain.   HPI: [name] is an 56 y.o. female with hx of ischemic CMP, prior NSTEMI, known CAD s/p 2 DES stent placements, hx of anxiety, HTN, HLD, GERD, combined CHF, EF 30-35%, presents to the ER with chest pain for several hours, with no nausea, vomiting, or SOB.  Work up in the ER showed EKG in NSR with no acute ST-T changes, and troponin was negative.  Her CXR was clear.  EDP consulted cardiology at [facility], and Dr Koleen Nimrod recommended IV Heparin and admission here at [facility].  She was started on IV NTG, and given ASA.  Hospitalist was asked to admit her for chest pain.   ED Course:  See above.  Rewiew of Systems:  Constitutional: Negative for malaise, fever and chills. No significant weight loss or weight gain Eyes: Negative for eye pain, redness and discharge, diplopia, visual changes, or flashes of light. ENMT: Negative for ear pain, hoarseness, nasal congestion, sinus pressure and sore throat. No headaches; tinnitus, drooling, or problem swallowing. Cardiovascular: Negative for palpitations, diaphoresis,  and peripheral edema. ; No orthopnea, PND Respiratory: Negative for cough, hemoptysis, wheezing and stridor. No pleuritic chestpain. Gastrointestinal: Negative for diarrhea, constipation,  melena, blood in stool, hematemesis, jaundice and rectal bleeding.    Genitourinary: Negative for frequency, dysuria, incontinence,flank pain and hematuria; Musculoskeletal: Negative for back pain and neck pain. Negative for swelling and trauma.;  Skin: . Negative for pruritus, rash, abrasions, bruising and skin lesion.; ulcerations Neuro: Negative for headache, lightheadedness and neck stiffness. Negative for weakness, altered level of consciousness , altered mental status, extremity weakness, burning feet, involuntary movement, seizure  and syncope.  Psych: negative for anxiety, depression, insomnia, tearfulness, panic attacks, hallucinations, paranoia, suicidal or homicidal ideation    Past Medical History:  Diagnosis Date  . Anxiety state 03/25/2016  . Basal cell carcinoma of forehead   . CHF (congestive heart failure) ([place])   . Coronary artery disease    a. 03/11/16 PCI with DES-->Prox/Mid Cx, 03/14/16 PCI with DES x2-->RCA, EF 30-35%  . Essential hypertension   . GERD (gastroesophageal reflux disease)   . History of pneumonia   . Hyperlipidemia   . Ischemic cardiomyopathy   . Mitral regurgitation   . NSTEMI (non-ST elevated myocardial infarction) ([place]) 03/10/2016  . Squamous cell cancer of skin of nose   . Thrombocytosis ([place]) 03/26/2016  . Wears dentures   . Wears glasses     Past Surgical History:  Procedure Laterality Date  . APPENDECTOMY    . CARDIAC CATHETERIZATION N/A 03/11/2016   Procedure: Left Heart Cath and Coronary Angiography;  Surgeon: Leonie Man, MD;  Location: [facility];  Service: Cardiovascular;  Laterality: N/A;  . CARDIAC CATHETERIZATION N/A 03/11/2016   Procedure: Coronary Stent Intervention;  Surgeon: Leonie Man, MD;  Location: [facility];  Service: Cardiovascular;  Laterality: N/A;  . CARDIAC CATHETERIZATION N/A 03/14/2016   Procedure: Coronary Stent Intervention;  Surgeon: Michai Dieppa M [place], MD;  Location: [facility];  Service: Cardiovascular;  Laterality: N/A;  . CHOLECYSTECTOMY OPEN  1984  . CORONARY ANGIOPLASTY WITH STENT PLACEMENT  03/14/2016  . FINGER ARTHROPLASTY Left 05/14/2013   Procedure: LEFT THUMB CARPAL METACARPAL ARTHROPLASTY;  Surgeon: Tennis Must, MD;  Location: [facility];  Service: Orthopedics;  Laterality: Left;  .  TUBAL LIGATION  1987  . VAGINAL HYSTERECTOMY  2009     reports that she has quit smoking. Her smoking use included Cigarettes. She has a 15.00 pack-year smoking history. She has never used smokeless tobacco. She  reports that she drinks about 3.6 oz of alcohol per week . She reports that she uses drugs, including Marijuana.  No Known Allergies  Family History  Problem Relation Age of Onset  . Stroke Mother   . Hypertension Mother   . Diabetes Mother   . Heart failure Other      Prior to Admission medications   Medication Sig Start Date End Date Taking? Authorizing Provider  acetaminophen (TYLENOL) 500 MG tablet Take 1 tablet (500 mg total) by mouth every 8 (eight) hours as needed for mild pain, moderate pain or headache. 03/15/16   Cheryln Manly, NP  aspirin EC 81 MG tablet Take 81 mg by mouth daily.    Historical Provider, MD  atorvastatin (LIPITOR) 80 MG tablet Take 1 tablet (80 mg total) by mouth daily. 07/07/16 10/05/16  Eileen Stanford, PA-C  carvedilol (COREG) 3.125 MG tablet Take 1 tablet (3.125 mg total) by mouth 2 (two) times daily with a meal. 08/01/16   Eileen Stanford, PA-C  clopidogrel (PLAVIX) 75 MG tablet Take 1 tablet (75 mg total) by mouth daily. 06/02/16   Lendon Colonel, NP  furosemide (LASIX) 20 MG tablet Take 2 tablets (40 mg total) by mouth daily. 07/08/16   Eileen Stanford, PA-C  LORazepam (ATIVAN) 0.5 MG tablet Take 1 tablet (0.5 mg total) by mouth every 12 (twelve) hours as needed for anxiety. DO NOT DRIVE WHEN TAKING THIS MEDICATION. 03/26/16   Rexene Alberts, MD  losartan (COZAAR) 25 MG tablet Take 0.5 tablets (12.5 mg total) by mouth every evening. 05/20/16   Thurnell Lose, MD  nitroGLYCERIN (NITROSTAT) 0.4 MG SL tablet Place 1 tablet (0.4 mg total) under the tongue every 5 (five) minutes x 3 doses as needed for chest pain. 09/06/15   Almyra Deforest, PA  pantoprazole (PROTONIX) 20 MG tablet Take 20 mg by mouth 2 (two) times daily.    Historical Provider, MD  potassium chloride SA (K-DUR,KLOR-CON) 20 MEQ tablet Take 1 tablet (20 mEq total) by mouth daily. 06/20/16   Kathie Dike, MD  ranitidine (ZANTAC) 150 MG tablet Take 1 tablet (150 mg total) by mouth 2 (two) times  daily. 06/20/16   Kathie Dike, MD    Physical Exam: Vitals:   08/04/16 0300 08/04/16 0330 08/04/16 0400 08/04/16 0430  BP: 127/83 115/70 130/80 (!) 142/88  Pulse: 86 77 85 88  Resp: 18 20 (!) 22 (!) 25  Temp:      TempSrc:      SpO2: 97% 97% 98% 97%  Weight:      Height:       Constitutional: NAD, calm, comfortable Vitals:   08/04/16 0300 08/04/16 0330 08/04/16 0400 08/04/16 0430  BP: 127/83 115/70 130/80 (!) 142/88  Pulse: 86 77 85 88  Resp: 18 20 (!) 22 (!) 25  Temp:      TempSrc:      SpO2: 97% 97% 98% 97%  Weight:      Height:       Eyes: PERRL, lids and conjunctivae normal ENMT: Mucous membranes are moist. Posterior pharynx clear of any exudate or lesions.Normal dentition.  Neck: normal, supple, no masses, no thyromegaly Respiratory: clear to auscultation bilaterally, no wheezing, no crackles. Normal respiratory effort. No accessory muscle  use.  Cardiovascular: Regular rate and rhythm, no murmurs / rubs / gallops. No extremity edema. 2+ pedal pulses. No carotid bruits.  Abdomen: no tenderness, no masses palpated. No hepatosplenomegaly. Bowel sounds positive.  Musculoskeletal: no clubbing / cyanosis. No joint deformity upper and lower extremities. Good ROM, no contractures. Normal muscle tone.  Skin: no rashes, lesions, ulcers. No induration Neurologic: CN 2-12 grossly intact. Sensation intact, DTR normal. Strength 5/5 in all 4.  Psychiatric: Normal judgment and insight. Alert and oriented x 3. Normal mood.   Labs on Admission: I have personally reviewed following labs and imaging studies CBC:  Recent Labs Lab 08/04/16 0250  WBC 9.1  NEUTROABS 5.3  HGB 11.3*  HCT 33.2*  MCV 81.8  PLT 268   Basic Metabolic Panel:  Recent Labs Lab 08/04/16 0250  NA 138  K 2.6*  CL 103  CO2 26  GLUCOSE 102*  BUN 13  CREATININE 0.72  CALCIUM 9.3  MG 1.9   GFR: Estimated Creatinine Clearance: 57.7 mL/min (by C-G formula based on SCr of 0.72 mg/dL). Liver Function  Tests:  Recent Labs Lab 08/04/16 0250  AST 18  ALT 16  ALKPHOS 62  BILITOT 0.8  PROT 6.4*  ALBUMIN 3.6    Recent Labs Lab 08/04/16 0250  LIPASE 36   Coagulation Profile:  Recent Labs Lab 08/04/16 0250  INR 0.96   Cardiac Enzymes:  Recent Labs Lab 08/04/16 0250  TROPONINI 0.03*   BNP (last 3 results)  Recent Labs  07/07/16 0900  PROBNP 910*   HbA1C: Urine analysis:    Component Value Date/Time   COLORURINE YELLOW 03/17/2016 2036   APPEARANCEUR CLEAR 03/17/2016 2036   LABSPEC >1.030 (H) 03/17/2016 2036   PHURINE 5.5 03/17/2016 2036   GLUCOSEU NEGATIVE 03/17/2016 2036   HGBUR NEGATIVE 03/17/2016 2036   BILIRUBINUR NEGATIVE 03/17/2016 2036   KETONESUR TRACE (A) 03/17/2016 2036   PROTEINUR NEGATIVE 03/17/2016 2036   NITRITE NEGATIVE 03/17/2016 2036   LEUKOCYTESUR NEGATIVE 03/17/2016 2036   Radiological Exams on Admission: Dg Chest 2 View  Result Date: 08/04/2016 CLINICAL DATA:  56 y/o  F; chest pressure with shortness of breath. EXAM: CHEST  2 VIEW COMPARISON:  07/05/2014 chest radiograph. FINDINGS: Stable heart size and mediastinal contours are within normal limits. Both lungs are clear. Mild degenerative changes of the thoracic spine. Right upper quadrant cholecystectomy clips. IMPRESSION: No acute pulmonary process identified. Electronically Signed   By: Kristine Garbe M.D.   On: 08/04/2016 03:39    EKG: Independently reviewed.   Assessment/Plan Principal Problem:   Chest pain Active Problems:   Essential hypertension   Hyperlipidemia   GERD (gastroesophageal reflux disease)   Cardiomyopathy, ischemic   CAD (coronary artery disease)   Chronic combined systolic and diastolic CHF (congestive heart failure) (H[facility])    PLAN:   Chest pain: atypical, though patient has known ischemic cardiomyopathy.  Her hx has some element of unstable angina, and others not so much.  She also has GERD, making it difficult to ascertain [place].  Will continue  with IV Heparin, ASA, and IV NTG.  Continue with Lipitor and Coreg.  Will consult cardiology for further recommendation.  Will give PPI and GI cocktail also.    HLD:  Continue with statin.  GERD:  Give BID PPI.  BP is controlled.  Will continue with meds.   Anxiety:  Continue with Ativan orally.    DVT prophylaxis: IV Heparin.  Code Status: FULL CODE.  Family Communication: husband at bedside.  Disposition Plan: to home.  Consults called: Cardiology.   Admission status: OBS.    Alvah Lagrow MD FACP. [facility]  If 7PM-7AM, please contact night-coverage [email or web address] Password [facility]  08/04/2016, 4:50 AM

## 2016-08-04 NOTE — ED Notes (Signed)
NITROPASTE REMOVED FROM THE RIGHT CHEST

## 2016-08-04 NOTE — ED Provider Notes (Signed)
[facility] DEPT Provider Note   CSN: [id number] Arrival date & time: 08/04/16  0218     History   Chief Complaint Chief Complaint  Patient presents with  . Chest Pain    HPI Erin [name] is a 56 y.o. female.  Patient presents with chest pressure that has been constant since approximately 10 PM with some shortness of breath and nausea. She feels this is similar to when she had her MI in November. She had 2 stents placed at that time. EF was 3035%. This pain is associated with nausea and shortness of breath. She took 2 nitroglycerin glycerin home without relief. The last time she had this pain was in November. She had an admission about a month ago for chest pain but feels that was more due to her heartburn. She denies any abdominal pain or vomiting. She denies any diaphoresis. She does have nausea and shortness of breath. She feels more pressure and tightness in her chest rather than pain. This reminds her of her MI in November.   The history is provided by the patient.  Chest Pain   Associated symptoms include nausea and shortness of breath. Pertinent negatives include no abdominal pain, no cough, no dizziness, no fever, no headaches, no vomiting and no weakness.    Past Medical History:  Diagnosis Date  . Anxiety state 03/25/2016  . Basal cell carcinoma of forehead   . CHF (congestive heart failure) ([place])   . Coronary artery disease    a. 03/11/16 PCI with DES-->Prox/Mid Cx, 03/14/16 PCI with DES x2-->RCA, EF 30-35%  . Essential hypertension   . GERD (gastroesophageal reflux disease)   . History of pneumonia   . Hyperlipidemia   . Ischemic cardiomyopathy   . Mitral regurgitation   . NSTEMI (non-ST elevated myocardial infarction) ([place]) 03/10/2016  . Squamous cell cancer of skin of nose   . Thrombocytosis ([place]) 03/26/2016  . Wears dentures   . Wears glasses     Patient Active Problem List   Diagnosis Date Noted  . Chronic combined systolic and diastolic CHF  (congestive heart failure) ([place]) 06/20/2016  . Hypokalemia   . Acute CHF (congestive heart failure) ([place]) 05/16/2016  . Acute on chronic systolic CHF (congestive heart failure) ([place]) 05/16/2016  . Acute respiratory failure with hypoxia ([place])   . Thrombocytosis ([place]) 03/26/2016  . Cardiomyopathy, ischemic 03/25/2016  . Acute on chronic combined systolic and diastolic congestive heart failure ([place]) 03/25/2016  . Anxiety state 03/25/2016  . Troponin level elevated 03/25/2016  . CAD (coronary artery disease) 03/25/2016  . Normocytic anemia 03/25/2016  . SOB (shortness of breath) 03/24/2016  . Lightheadedness 03/17/2016  . Hypotension 03/17/2016  . Tobacco abuse 03/12/2016  . NSTEMI (non-ST elevated myocardial infarction) ([place]) 03/11/2016  . Pain in the chest   . Essential hypertension 09/06/2015  . Hyperlipidemia 09/06/2015  . GERD (gastroesophageal reflux disease) 09/06/2015  . Chest pain 09/06/2015    Past Surgical History:  Procedure Laterality Date  . APPENDECTOMY    . CARDIAC CATHETERIZATION N/A 03/11/2016   Procedure: Left Heart Cath and Coronary Angiography;  Surgeon: Leonie Man, MD;  Location: [facility];  Service: Cardiovascular;  Laterality: N/A;  . CARDIAC CATHETERIZATION N/A 03/11/2016   Procedure: Coronary Stent Intervention;  Surgeon: Leonie Man, MD;  Location: [facility];  Service: Cardiovascular;  Laterality: N/A;  . CARDIAC CATHETERIZATION N/A 03/14/2016   Procedure: Coronary Stent Intervention;  Surgeon: Peter M Martinique, MD;  Location: [facility];  Service: Cardiovascular;  Laterality: N/A;  . CHOLECYSTECTOMY OPEN  1984  . CORONARY ANGIOPLASTY WITH STENT PLACEMENT  03/14/2016  . FINGER ARTHROPLASTY Left 05/14/2013   Procedure: LEFT THUMB CARPAL METACARPAL ARTHROPLASTY;  Surgeon: Tennis Must, MD;  Location: [facility];  Service: Orthopedics;  Laterality: Left;  . TUBAL LIGATION  1987  . VAGINAL HYSTERECTOMY  2009    OB  History    Gravida Para Term Preterm AB Living   4 4 4     4    SAB TAB Ectopic Multiple Live Births                   Home Medications    Prior to Admission medications   Medication Sig Start Date End Date Taking? Authorizing Provider  acetaminophen (TYLENOL) 500 MG tablet Take 1 tablet (500 mg total) by mouth every 8 (eight) hours as needed for mild pain, moderate pain or headache. 03/15/16   Cheryln Manly, NP  aspirin EC 81 MG tablet Take 81 mg by mouth daily.    Historical Provider, MD  atorvastatin (LIPITOR) 80 MG tablet Take 1 tablet (80 mg total) by mouth daily. 07/07/16 10/05/16  Eileen Stanford, PA-C  carvedilol (COREG) 3.125 MG tablet Take 1 tablet (3.125 mg total) by mouth 2 (two) times daily with a meal. 08/01/16   Eileen Stanford, PA-C  clopidogrel (PLAVIX) 75 MG tablet Take 1 tablet (75 mg total) by mouth daily. 06/02/16   Lendon Colonel, NP  furosemide (LASIX) 20 MG tablet Take 2 tablets (40 mg total) by mouth daily. 07/08/16   Eileen Stanford, PA-C  LORazepam (ATIVAN) 0.5 MG tablet Take 1 tablet (0.5 mg total) by mouth every 12 (twelve) hours as needed for anxiety. DO NOT DRIVE WHEN TAKING THIS MEDICATION. 03/26/16   Rexene Alberts, MD  losartan (COZAAR) 25 MG tablet Take 0.5 tablets (12.5 mg total) by mouth every evening. 05/20/16   Thurnell Lose, MD  nitroGLYCERIN (NITROSTAT) 0.4 MG SL tablet Place 1 tablet (0.4 mg total) under the tongue every 5 (five) minutes x 3 doses as needed for chest pain. 09/06/15   Almyra Deforest, PA  pantoprazole (PROTONIX) 20 MG tablet Take 20 mg by mouth 2 (two) times daily.    Historical Provider, MD  potassium chloride SA (K-DUR,KLOR-CON) 20 MEQ tablet Take 1 tablet (20 mEq total) by mouth daily. 06/20/16   Kathie Dike, MD  ranitidine (ZANTAC) 150 MG tablet Take 1 tablet (150 mg total) by mouth 2 (two) times daily. 06/20/16   Kathie Dike, MD    Family History Family History  Problem Relation Age of Onset  . Stroke Mother   .  Hypertension Mother   . Diabetes Mother   . Heart failure Other     Social History Social History  Substance Use Topics  . Smoking status: Former Smoker    Packs/day: 1.00    Years: 15.00    Types: Cigarettes  . Smokeless tobacco: Never Used     Comment: quit 2 weeks ago-03/24/16  . Alcohol use 3.6 oz/week    6 Cans of beer per week     Comment: occ     Allergies   Patient has no known allergies.   Review of Systems Review of Systems  Constitutional: Negative for activity change, appetite change and fever.  HENT: Negative for congestion and rhinorrhea.   Respiratory: Positive for chest tightness and shortness of breath. Negative for cough.  Cardiovascular: Positive for chest pain.  Gastrointestinal: Positive for nausea. Negative for abdominal pain and vomiting.  Genitourinary: Negative for dysuria, hematuria, vaginal bleeding and vaginal discharge.  Musculoskeletal: Negative for arthralgias and myalgias.  Neurological: Negative for dizziness, weakness and headaches.  A complete 10 system review of systems was obtained and all systems are negative except as noted in the HPI and PMH.     Physical Exam Updated Vital Signs BP 139/89   Pulse 90   Temp 98.1 F (36.7 C) (Oral)   Resp 20   Ht 5' (1.524 m)   Wt 110 lb (49.9 kg)   SpO2 96%   BMI 21.48 kg/m   Physical Exam  Constitutional: She is oriented to person, place, and time. She appears well-developed and well-nourished. No distress.  HENT:  Head: Normocephalic and atraumatic.  Mouth/Throat: Oropharynx is clear and moist. No oropharyngeal exudate.  Eyes: Conjunctivae and EOM are normal. Pupils are equal, round, and reactive to light.  Neck: Normal range of motion. Neck supple.  No meningismus.  Cardiovascular: Normal rate, regular rhythm, normal heart sounds and intact distal pulses.   No murmur heard. Pulmonary/Chest: Effort normal and breath sounds normal. No respiratory distress. She exhibits no  tenderness.  Abdominal: Soft. There is no tenderness. There is no rebound and no guarding.  Musculoskeletal: Normal range of motion. She exhibits no edema or tenderness.  Neurological: She is alert and oriented to person, place, and time. No cranial nerve deficit. She exhibits normal muscle tone. Coordination normal.  No ataxia on finger to nose bilaterally. No pronator drift. 5/5 strength throughout. CN 2-12 intact.Equal grip strength. Sensation intact.   Skin: Skin is warm.  Psychiatric: She has a normal mood and affect. Her behavior is normal.  Nursing note and vitals reviewed.    ED Treatments / Results  Labs (all labs ordered are listed, but only abnormal results are displayed) Labs Reviewed  CBC WITH DIFFERENTIAL/PLATELET - Abnormal; Notable for the following:       Result Value   Hemoglobin 11.3 (*)    HCT 33.2 (*)    RDW 16.3 (*)    All other components within normal limits  COMPREHENSIVE METABOLIC PANEL - Abnormal; Notable for the following:    Potassium 2.6 (*)    Glucose, Bld 102 (*)    Total Protein 6.4 (*)    All other components within normal limits  TROPONIN I - Abnormal; Notable for the following:    Troponin I 0.03 (*)    All other components within normal limits  BRAIN NATRIURETIC PEPTIDE - Abnormal; Notable for the following:    B Natriuretic Peptide 332.0 (*)    All other components within normal limits  LIPASE, BLOOD  MAGNESIUM  APTT  PROTIME-INR    EKG  EKG Interpretation  Date/Time:  Thursday August 04 2016 02:43:32 EDT Ventricular Rate:  88 PR Interval:    QRS Duration: 96 QT Interval:  383 QTC Calculation: 464 R Axis:   0 Text Interpretation:  Sinus rhythm Probable left atrial enlargement Borderline low voltage, extremity leads No significant change was found Confirmed by Wyvonnia Dusky  MD, Terald Jump [phone]) on 08/04/2016 2:53:48 AM       Radiology Dg Chest 2 View  Result Date: 08/04/2016 CLINICAL DATA:  56 y/o  F; chest pressure with shortness  of breath. EXAM: CHEST  2 VIEW COMPARISON:  07/05/2014 chest radiograph. FINDINGS: Stable heart size and mediastinal contours are within normal limits. Both lungs are clear. Mild degenerative changes of  the thoracic spine. Right upper quadrant cholecystectomy clips. IMPRESSION: No acute pulmonary process identified. Electronically Signed   By: Kristine Garbe M.D.   On: 08/04/2016 03:39    Procedures Procedures (including critical care time)  Medications Ordered in ED Medications  ondansetron (ZOFRAN) 4 MG/2ML injection (not administered)  morphine 4 MG/ML injection 4 mg (4 mg Intravenous Given 08/04/16 0251)  nitroGLYCERIN (NITROGLYN) 2 % ointment 1 inch (1 inch Topical Given 08/04/16 0251)  ondansetron (ZOFRAN) injection 4 mg (4 mg Intravenous Given 08/04/16 0301)     Initial Impression / Assessment and Plan / ED Course  I have reviewed the triage vital signs and the nursing notes.  Pertinent labs & imaging results that were available during my care of the patient were reviewed by me and considered in my medical decision making (see chart for details).     Chest pressure with shortness of breath and nausea similar to previous MI. EKG without acute ST changes.  Patient given aspirin as well as nitro paste. Catheterization record reviewed and shows patient has single vessel disease status post 2 stents.  Troponin minimally elevated 0.03. Also hypokalemic.  Discussed with Dr. Koleen Nimrod of cardiology. He feels patient can be monitored at [facility] to assess for trend does not recommend transfer at this time. Cardiology will be available later this morning.  Potassium repleted.  Continue IV heparin and IV NTG.  Pain improving.  Patient also with GERD which complicates picture. Observation admission d/w Dr. Marin Comment.   CRITICAL CARE Performed by: Ezequiel Essex Total critical care time: 35 minutes Critical care time was exclusive of separately billable procedures and  treating other patients. Critical care was necessary to treat or prevent imminent or life-threatening deterioration. Critical care was time spent personally by me on the following activities: development of treatment plan with patient and/or surrogate as well as nursing, discussions with consultants, evaluation of patient's response to treatment, examination of patient, obtaining history from patient or surrogate, ordering and performing treatments and interventions, ordering and review of laboratory studies, ordering and review of radiographic studies, pulse oximetry and re-evaluation of patient's condition.  Final Clinical Impressions(s) / ED Diagnoses   Final diagnoses:  Unstable angina ([place])  Hypokalemia    New Prescriptions New Prescriptions   No medications on file     Ezequiel Essex, MD 08/04/16 [phone]

## 2016-08-04 NOTE — Progress Notes (Signed)
ANTICOAGULATION CONSULT NOTE - Preliminary  Pharmacy Consult for heparin Indication: chest pain/ACS  No Known Allergies  Patient Measurements: Height: 5' (152.4 cm) Weight: 110 lb (49.9 kg) IBW/kg (Calculated) : 45.5 HEPARIN DW (KG): 49.9   Vital Signs: Temp: 98.1 F (36.7 C) (03/29 0228) Temp Source: Oral (03/29 0228) BP: 115/70 (03/29 0330) Pulse Rate: 77 (03/29 0330)  Labs:  Recent Labs  08/04/16 0250  HGB 11.3*  HCT 33.2*  PLT 378  CREATININE 0.72  TROPONINI 0.03*   Estimated Creatinine Clearance: 57.7 mL/min (by C-G formula based on SCr of 0.72 mg/dL).  Medical History: Past Medical History:  Diagnosis Date  . Anxiety state 03/25/2016  . Basal cell carcinoma of forehead   . CHF (congestive heart failure) ([place])   . Coronary artery disease    a. 03/11/16 PCI with DES-->Prox/Mid Cx, 03/14/16 PCI with DES x2-->RCA, EF 30-35%  . Essential hypertension   . GERD (gastroesophageal reflux disease)   . History of pneumonia   . Hyperlipidemia   . Ischemic cardiomyopathy   . Mitral regurgitation   . NSTEMI (non-ST elevated myocardial infarction) ([place]) 03/10/2016  . Squamous cell cancer of skin of nose   . Thrombocytosis ([place]) 03/26/2016  . Wears dentures   . Wears glasses     Medications:   (Not in a hospital admission) Scheduled:  . heparin  3,000 Units Intravenous Once  . nitroGLYCERIN  5-200 mcg/min Intravenous Once   Infusions:  . heparin    . potassium chloride     PRN:   Assessment: 56 yo female with chest pain similar to when she had MI in November.  Had cath 03/11/16. Labs pending, plts normal. Troponin high, starting heparin per pharmacy.   Goal of Therapy:  Heparin level 0.3-0.7 units/ml   Plan:  Give 3000 units bolus x 1 Start heparin infusion at 600 units/hr Check anti-Xa level in 6 hours and daily while on heparin Continue to monitor H&H and platelets Preliminary review of pertinent patient information completed.  Forestine Na clinical  pharmacist will complete review during morning rounds to assess the patient and finalize treatment regimen.  Nyra Capes, RPH 08/04/2016,4:19 AM

## 2016-08-05 ENCOUNTER — Inpatient Hospital Stay (HOSPITAL_BASED_OUTPATIENT_CLINIC_OR_DEPARTMENT_OTHER): Payer: PRIVATE HEALTH INSURANCE

## 2016-08-05 ENCOUNTER — Encounter (HOSPITAL_COMMUNITY): Payer: PRIVATE HEALTH INSURANCE

## 2016-08-05 ENCOUNTER — Encounter (HOSPITAL_COMMUNITY): Payer: Self-pay

## 2016-08-05 DIAGNOSIS — E782 Mixed hyperlipidemia: Secondary | ICD-10-CM

## 2016-08-05 DIAGNOSIS — R079 Chest pain, unspecified: Secondary | ICD-10-CM | POA: Diagnosis not present

## 2016-08-05 LAB — NM MYOCAR MULTI W/SPECT W/WALL MOTION / EF
LV dias vol: 117 mL (ref 46–106)
LV sys vol: 92 mL
Peak HR: 121 {beats}/min
RATE: 0
Rest HR: 64 {beats}/min
SDS: 1
SRS: 17
SSS: 18
TID: 0.99

## 2016-08-05 LAB — CBC
HCT: 30.2 % — ABNORMAL LOW (ref 36.0–46.0)
Hemoglobin: 9.7 g/dL — ABNORMAL LOW (ref 12.0–15.0)
MCH: 27.1 pg (ref 26.0–34.0)
MCHC: 32.1 g/dL (ref 30.0–36.0)
MCV: 84.4 fL (ref 78.0–100.0)
Platelets: 334 10*3/uL (ref 150–400)
RBC: 3.58 MIL/uL — ABNORMAL LOW (ref 3.87–5.11)
RDW: 17 % — ABNORMAL HIGH (ref 11.5–15.5)
WBC: 8 10*3/uL (ref 4.0–10.5)

## 2016-08-05 LAB — HEMOGLOBIN AND HEMATOCRIT, BLOOD
HCT: 33.6 % — ABNORMAL LOW (ref 36.0–46.0)
Hemoglobin: 11.2 g/dL — ABNORMAL LOW (ref 12.0–15.0)

## 2016-08-05 LAB — HEPARIN LEVEL (UNFRACTIONATED): Heparin Unfractionated: 0.31 IU/mL (ref 0.30–0.70)

## 2016-08-05 LAB — HIV ANTIBODY (ROUTINE TESTING W REFLEX): HIV Screen 4th Generation wRfx: NONREACTIVE

## 2016-08-05 MED ORDER — REGADENOSON 0.4 MG/5ML IV SOLN
INTRAVENOUS | Status: AC
  Administered 2016-08-05: 0.4 mg via INTRAVENOUS
  Filled 2016-08-05: qty 5

## 2016-08-05 MED ORDER — TECHNETIUM TC 99M TETROFOSMIN IV KIT
30.0000 | PACK | Freq: Once | INTRAVENOUS | Status: AC | PRN
  Administered 2016-08-05: 30.7 via INTRAVENOUS

## 2016-08-05 MED ORDER — TECHNETIUM TC 99M TETROFOSMIN IV KIT
10.0000 | PACK | Freq: Once | INTRAVENOUS | Status: AC | PRN
  Administered 2016-08-05: 9.75 via INTRAVENOUS

## 2016-08-05 MED ORDER — SODIUM CHLORIDE 0.9% FLUSH
INTRAVENOUS | Status: AC
  Administered 2016-08-05: 10 mL via INTRAVENOUS
  Filled 2016-08-05: qty 10

## 2016-08-05 MED ORDER — POTASSIUM CHLORIDE CRYS ER 20 MEQ PO TBCR: 20.0000 meq | EXTENDED_RELEASE_TABLET | Freq: Every day | ORAL | 0 refills | Status: DC

## 2016-08-05 MED ORDER — HEPARIN SODIUM (PORCINE) 5000 UNIT/ML IJ SOLN
5000.0000 [IU] | Freq: Three times a day (TID) | INTRAMUSCULAR | Status: DC
  Filled 2016-08-05: qty 1

## 2016-08-05 MED ORDER — CARVEDILOL 3.125 MG PO TABS: 3.1250 mg | ORAL_TABLET | Freq: Two times a day (BID) | ORAL | 0 refills | Status: DC

## 2016-08-05 MED ORDER — RANOLAZINE ER 500 MG PO TB12
500.0000 mg | ORAL_TABLET | Freq: Two times a day (BID) | ORAL | Status: DC
  Administered 2016-08-05: 500 mg via ORAL
  Filled 2016-08-05 (×7): qty 1

## 2016-08-05 MED ORDER — RANOLAZINE ER 500 MG PO TB12: 500.0000 mg | ORAL_TABLET | Freq: Two times a day (BID) | ORAL | 0 refills | Status: DC

## 2016-08-05 NOTE — Progress Notes (Signed)
Subjective:   No chest pain this AM   Objective:   Temp:  [97.4 F (36.3 C)-98.5 F (36.9 C)] 97.7 F (36.5 C) (03/30 1005) Pulse Rate:  [63-92] 77 (03/30 0802) Resp:  [13-28] 14 (03/30 0600) BP: (91-127)/(60-81) 103/76 (03/30 0802) SpO2:  [93 %-98 %] 96 % (03/30 0600) Weight:  [116 lb 2.9 oz (52.7 kg)] 116 lb 2.9 oz (52.7 kg) (03/30 0500) Last BM Date: 08/04/16  Filed Weights   08/04/16 0226 08/04/16 0551 08/05/16 0500  Weight: 110 lb (49.9 kg) 112 lb 3.4 oz (50.9 kg) 116 lb 2.9 oz (52.7 kg)    Intake/Output Summary (Last 24 hours) at 08/05/16 1019 Last data filed at 08/05/16 0900  Gross per 24 hour  Intake           771.88 ml  Output                0 ml  Net           771.88 ml    Telemetry:SR  Exam:  General: NAD  HEENT: sclera clear, throat clear  Resp: CTAB  Cardiac: RRR, 2/6 systolic at apex, no jvd  GE:ZMOQHUT soft, NT, ND  MSK:no LE edema  Neuro: no focal deficits  Psych: appropriate affect  Lab Results:  Basic Metabolic Panel:  Recent Labs Lab 08/04/16 0250 08/04/16 0957 08/04/16 1840  NA 138  --  135  K 2.6* 3.7 4.3  CL 103  --  106  CO2 26  --  24  GLUCOSE 102*  --  101*  BUN 13  --  13  CREATININE 0.72  --  0.85  CALCIUM 9.3  --  8.5*  MG 1.9  --   --     Liver Function Tests:  Recent Labs Lab 08/04/16 0250  AST 18  ALT 16  ALKPHOS 62  BILITOT 0.8  PROT 6.4*  ALBUMIN 3.6    CBC:  Recent Labs Lab 08/04/16 0250 08/05/16 0448  WBC 9.1 8.0  HGB 11.3* 9.7*  HCT 33.2* 30.2*  MCV 81.8 84.4  PLT 378 334    Cardiac Enzymes:  Recent Labs Lab 08/04/16 0632 08/04/16 1122 08/04/16 1840  TROPONINI <0.03 <0.03 <0.03    BNP:  Recent Labs  07/07/16 0900  PROBNP 910*    Coagulation:  Recent Labs Lab 08/04/16 0250  INR 0.96    ECG:   Medications:   Scheduled Medications: . aspirin EC  81 mg Oral Daily  . atorvastatin  80 mg Oral Daily  . carvedilol  3.125 mg Oral BID WC  . clopidogrel  75  mg Oral Daily  . famotidine  20 mg Oral BID  . furosemide  40 mg Oral Daily  . heparin  5,000 Units Subcutaneous Q8H  . losartan  12.5 mg Oral QPM  . pantoprazole  40 mg Oral BID AC  . pneumococcal 23 valent vaccine  0.5 mL Intramuscular Tomorrow-1000  . potassium chloride SA  20 mEq Oral Daily  . sodium chloride flush  3 mL Intravenous Q12H     Infusions:   PRN Medications:  acetaminophen, LORazepam, nitroGLYCERIN, ondansetron **OR** ondansetron (ZOFRAN) IV     Assessment/Plan    1. CAD/chest pain - patient admitted with chest pain. Somewhat atypical symptoms, primarily in duration.  - no evidence of ACS by EKG or enzymes - nuclear stress shows large inferolateral wall infarct consistent with her known LCX and RCA disease. No significant peri-infarct ischemia - continue medical  therapy for CAD. She had been off her coreg, now back on. Soft bp's at times limits antianginals. Will try ranexa 500mg  bid. - d/c heparin drip  2. Chronic systolic HF - continue medical therapy, appears euvolemic - medical therapy limited by soft bp's.    No further cardiac testing or interventions planned at this time. We will sign off. We will arrange f/u in 2 weeks.     Carlyle Dolly, M.D.

## 2016-08-05 NOTE — Progress Notes (Signed)
ANTICOAGULATION CONSULT NOTE - Follow Up Consult  Pharmacy Consult for Heparin Indication: chest pain/ACS  No Known Allergies  Patient Measurements: Height: 5' (152.4 cm) Weight: 116 lb 2.9 oz (52.7 kg) IBW/kg (Calculated) : 45.5 HEPARIN DW (KG): 50.9  Vital Signs: Temp: 97.9 F (36.6 C) (03/30 0400) Temp Source: Oral (03/30 0400) BP: 109/62 (03/30 0600) Pulse Rate: 66 (03/30 0600)  Labs:  Recent Labs  08/04/16 0250 08/04/16 0632 08/04/16 1122 08/04/16 1840 08/04/16 2022 08/05/16 0448  HGB 11.3*  --   --   --   --  9.7*  HCT 33.2*  --   --   --   --  30.2*  PLT 378  --   --   --   --  334  APTT 32  --   --   --   --   --   LABPROT 12.8  --   --   --   --   --   INR 0.96  --   --   --   --   --   HEPARINUNFRC  --   --  0.10*  --  0.43 0.31  CREATININE 0.72  --   --  0.85  --   --   TROPONINI 0.03* <0.03 <0.03 <0.03  --   --    Estimated Creatinine Clearance: 54.3 mL/min (by C-G formula based on SCr of 0.85 mg/dL).  Medications:  Scheduled:  . aspirin EC  81 mg Oral Daily  . atorvastatin  80 mg Oral Daily  . carvedilol  3.125 mg Oral BID WC  . clopidogrel  75 mg Oral Daily  . famotidine  20 mg Oral BID  . furosemide  40 mg Oral Daily  . losartan  12.5 mg Oral QPM  . pantoprazole  40 mg Oral BID AC  . pneumococcal 23 valent vaccine  0.5 mL Intramuscular Tomorrow-1000  . potassium chloride SA  20 mEq Oral Daily  . sodium chloride flush  3 mL Intravenous Q12H   Assessment: Heparin level therapeutic this am. No bleeding reported.  Goal of Therapy:  Heparin level 0.3-0.7 units/ml Monitor platelets by anticoagulation protocol: Yes   Plan:  Continue heparin infusion at 750 units/hr Recheck level later today to assure therapeutic. Continue to monitor H&H and platelets  Jakwan Sally Poteet 08/05/2016,7:45 AM

## 2016-08-05 NOTE — Telephone Encounter (Signed)
I am not sure how she got follow up with Dr. Marlou Porch. May have been my fault when I came out of the room. Dr. Johnsie Cancel saw her most recently. Can we get June appointment with [facility] changed to [facility]

## 2016-08-05 NOTE — Progress Notes (Signed)
Pt given After Visit Summary information after  Discussion & all questions answered. IV's dc'd. Pt transported via Wheelchair to family vehicle

## 2016-08-06 NOTE — Discharge Summary (Signed)
Physician Discharge Summary  Erin [name] AQT:[id number] DOB: 11/12/1960 DOA: 08/04/2016  PCP: Jonathon Bellows, MD  Admit date: 08/04/2016 Discharge date: 08/05/2016  Admitted From: HOME.  Disposition:  hoME.   Recommendations for Outpatient Follow-up:  1. Follow up with PCP in 1-2 weeks 2. Please obtain BMP/CBC in one week    Discharge Condition:STABLE.  CODE STATUS: full code.  Diet recommendation: Heart Healthy   Brief/Interim Summary: Erin J Huntis an 56 y.o.femalewith hx of ischemic CMP, prior NSTEMI, known CAD s/p 2 DES stent placements, hx of anxiety, HTN, HLD, GERD, combined CHF, EF 30-35%, presents to the ER with chest pain for several hours. She was started on IV heparin and IV nitro, reports chest pain resolved. Cardiology consulted and recomemndations given.   Discharge Diagnoses:  Principal Problem:   Chest pain Active Problems:   Essential hypertension   Hyperlipidemia   GERD (gastroesophageal reflux disease)   Cardiomyopathy, ischemic   CAD (coronary artery disease)   Chronic combined systolic and diastolic CHF (congestive heart failure) (HCC)   Atypical chest pain:  Suspect musculoskeletal. troponins are negative. She underwent stress test, no st segment elevation during the stress and no ischemic events. Resume medical therapy. Added ranexa to the regimen.    Chronic systolic heart failure: appears to be compensated and euvolemic. Resume home meds.    Hypertension: BP soft, but stable.    Hyperlipidemia: resume home meds  Discharge Instructions  Discharge Instructions    Diet - low sodium heart healthy    Complete by:  As directed    Discharge instructions    Complete by:  As directed    FOLLOW up with cardiology as recommended in 2 weeks.     Allergies as of 08/05/2016   No Known Allergies     Medication List    TAKE these medications   acetaminophen 500 MG tablet Commonly known as:  TYLENOL Take 1 tablet (500 mg total) by mouth  every 8 (eight) hours as needed for mild pain, moderate pain or headache.   aspirin EC 81 MG tablet Take 81 mg by mouth daily.   atorvastatin 80 MG tablet Commonly known as:  LIPITOR Take 1 tablet (80 mg total) by mouth daily.   calcium carbonate 500 MG chewable tablet Commonly known as:  TUMS - dosed in mg elemental calcium Chew 1 tablet by mouth daily as needed for indigestion or heartburn.   carvedilol 3.125 MG tablet Commonly known as:  COREG Take 1 tablet (3.125 mg total) by mouth 2 (two) times daily with a meal.   clopidogrel 75 MG tablet Commonly known as:  PLAVIX Take 1 tablet (75 mg total) by mouth daily.   furosemide 20 MG tablet Commonly known as:  LASIX Take 2 tablets (40 mg total) by mouth daily.   LORazepam 0.5 MG tablet Commonly known as:  ATIVAN Take 1 tablet (0.5 mg total) by mouth every 12 (twelve) hours as needed for anxiety. DO NOT DRIVE WHEN TAKING THIS MEDICATION. What changed:  when to take this  additional instructions   losartan 25 MG tablet Commonly known as:  COZAAR Take 0.5 tablets (12.5 mg total) by mouth every evening.   nitroGLYCERIN 0.4 MG SL tablet Commonly known as:  NITROSTAT Place 1 tablet (0.4 mg total) under the tongue every 5 (five) minutes x 3 doses as needed for chest pain.   pantoprazole 20 MG tablet Commonly known as:  PROTONIX Take 20 mg by mouth 2 (two) times daily.  potassium chloride SA 20 MEQ tablet Commonly known as:  K-DUR,KLOR-CON Take 1 tablet (20 mEq total) by mouth daily.   ranolazine 500 MG 12 hr tablet Commonly known as:  RANEXA Take 1 tablet (500 mg total) by mouth 2 (two) times daily.       No Known Allergies  Consultations:  cardiology   Procedures/Studies: Dg Chest 2 View  Result Date: 08/04/2016 CLINICAL DATA:  56 y/o  F; chest pressure with shortness of breath. EXAM: CHEST  2 VIEW COMPARISON:  07/05/2014 chest radiograph. FINDINGS: Stable heart size and mediastinal contours are within  normal limits. Both lungs are clear. Mild degenerative changes of the thoracic spine. Right upper quadrant cholecystectomy clips. IMPRESSION: No acute pulmonary process identified. Electronically Signed   By: Kristine Garbe M.D.   On: 08/04/2016 03:39   Nm Myocar Multi W/spect W/wall Motion / Ef  Result Date: 08/05/2016  There was no ST segment deviation noted during stress.  Findings consistent with prior large inferolateral myocardial infarction. There is no significant peri-infarct ischemia.  This is a high risk study. High risk study based on low LVEF. There is no signifiacnt myocardium currently at jeopardy.  The left ventricular ejection fraction is severely decreased (<30%).      Subjective: No chest pain or sob.   Discharge Exam: Vitals:   08/05/16 0802 08/05/16 1005  BP: 103/76   Pulse: 77   Resp:    Temp:  97.7 F (36.5 C)   Vitals:   08/05/16 0500 08/05/16 0600 08/05/16 0802 08/05/16 1005  BP: 91/62 109/62 103/76   Pulse: 66 66 77   Resp: 16 14    Temp:    97.7 F (36.5 C)  TempSrc:    Oral  SpO2: 96% 96%    Weight: 52.7 kg (116 lb 2.9 oz)     Height:        General: Pt is alert, awake, not in acute distress Cardiovascular: RRR, S1/S2 +, no rubs, no gallops Respiratory: CTA bilaterally, no wheezing, no rhonchi Abdominal: Soft, NT, ND, bowel sounds + Extremities: no edema, no cyanosis    The results of significant diagnostics from this hospitalization (including imaging, microbiology, ancillary and laboratory) are listed below for reference.     Microbiology: Recent Results (from the past 240 hour(s))  MRSA PCR Screening     Status: None   Collection Time: 08/04/16  2:50 PM  Result Value Ref Range Status   MRSA by PCR NEGATIVE NEGATIVE Final    Comment:        The GeneXpert MRSA Assay (FDA approved for NASAL specimens only), is one component of a comprehensive MRSA colonization surveillance program. It is not intended to diagnose  MRSA infection nor to guide or monitor treatment for MRSA infections.      Labs: BNP (last 3 results)  Recent Labs  05/16/16 0317 06/20/16 0408 08/04/16 0250  BNP 783.0* 148.0* 093.2*   Basic Metabolic Panel:  Recent Labs Lab 08/04/16 0250 08/04/16 0957 08/04/16 1840  NA 138  --  135  K 2.6* 3.7 4.3  CL 103  --  106  CO2 26  --  24  GLUCOSE 102*  --  101*  BUN 13  --  13  CREATININE 0.72  --  0.85  CALCIUM 9.3  --  8.5*  MG 1.9  --   --    Liver Function Tests:  Recent Labs Lab 08/04/16 0250  AST 18  ALT 16  ALKPHOS 62  BILITOT  0.8  PROT 6.4*  ALBUMIN 3.6    Recent Labs Lab 08/04/16 0250  LIPASE 36   No results for input(s): AMMONIA in the last 168 hours. CBC:  Recent Labs Lab 08/04/16 0250 08/05/16 0448 08/05/16 1309  WBC 9.1 8.0  --   NEUTROABS 5.3  --   --   HGB 11.3* 9.7* 11.2*  HCT 33.2* 30.2* 33.6*  MCV 81.8 84.4  --   PLT 378 334  --    Cardiac Enzymes:  Recent Labs Lab 08/04/16 0250 08/04/16 0632 08/04/16 1122 08/04/16 1840  TROPONINI 0.03* <0.03 <0.03 <0.03   BNP: Invalid input(s): POCBNP CBG: No results for input(s): GLUCAP in the last 168 hours. D-Dimer No results for input(s): DDIMER in the last 72 hours. Hgb A1c No results for input(s): HGBA1C in the last 72 hours. Lipid Profile No results for input(s): CHOL, HDL, LDLCALC, TRIG, CHOLHDL, LDLDIRECT in the last 72 hours. Thyroid function studies  Recent Labs  08/04/16 0250  TSH 0.944   Anemia work up No results for input(s): VITAMINB12, FOLATE, FERRITIN, TIBC, IRON, RETICCTPCT in the last 72 hours. Urinalysis    Component Value Date/Time   COLORURINE YELLOW 03/17/2016 2036   APPEARANCEUR CLEAR 03/17/2016 2036   LABSPEC >1.030 (H) 03/17/2016 2036   PHURINE 5.5 03/17/2016 2036   GLUCOSEU NEGATIVE 03/17/2016 2036   HGBUR NEGATIVE 03/17/2016 2036   BILIRUBINUR NEGATIVE 03/17/2016 2036   KETONESUR TRACE (A) 03/17/2016 2036   PROTEINUR NEGATIVE 03/17/2016  2036   NITRITE NEGATIVE 03/17/2016 2036   LEUKOCYTESUR NEGATIVE 03/17/2016 2036   Sepsis Labs Invalid input(s): PROCALCITONIN,  WBC,  LACTICIDVEN Microbiology Recent Results (from the past 240 hour(s))  MRSA PCR Screening     Status: None   Collection Time: 08/04/16  2:50 PM  Result Value Ref Range Status   MRSA by PCR NEGATIVE NEGATIVE Final    Comment:        The GeneXpert MRSA Assay (FDA approved for NASAL specimens only), is one component of a comprehensive MRSA colonization surveillance program. It is not intended to diagnose MRSA infection nor to guide or monitor treatment for MRSA infections.      Time coordinating discharge: Over 30 minutes  SIGNED:   Hosie Poisson, MD  [facility] 08/06/2016, 1:30 PM Pager   If 7PM-7AM, please contact night-coverage [email or web address] Password [email or web address]

## 2016-08-08 ENCOUNTER — Encounter (HOSPITAL_COMMUNITY): Payer: PRIVATE HEALTH INSURANCE

## 2016-08-10 ENCOUNTER — Encounter (HOSPITAL_COMMUNITY): Payer: PRIVATE HEALTH INSURANCE

## 2016-08-12 ENCOUNTER — Telehealth: Payer: Self-pay | Admitting: Cardiovascular Disease

## 2016-08-12 ENCOUNTER — Encounter (HOSPITAL_COMMUNITY): Payer: PRIVATE HEALTH INSURANCE

## 2016-08-12 NOTE — Telephone Encounter (Signed)
Patient wanted to know if it was okay for her to start a Nicotine patch to quit smoking. Informed patient that message would be sent to Dr. Johnsie Cancel for advisement. Patient verbalized understanding.

## 2016-08-12 NOTE — Telephone Encounter (Signed)
Yes nicotine patch ok

## 2016-08-12 NOTE — Telephone Encounter (Signed)
No answer will call patient on Monday.

## 2016-08-12 NOTE — Telephone Encounter (Signed)
New message    Pt is calling to ask about a quit smoking method and if it would be safe for her.

## 2016-08-15 ENCOUNTER — Encounter (HOSPITAL_COMMUNITY): Payer: PRIVATE HEALTH INSURANCE

## 2016-08-17 ENCOUNTER — Encounter (HOSPITAL_COMMUNITY): Payer: PRIVATE HEALTH INSURANCE

## 2016-08-17 ENCOUNTER — Other Ambulatory Visit: Payer: Self-pay

## 2016-08-17 MED ORDER — LOSARTAN POTASSIUM 25 MG PO TABS: 12.5000 mg | ORAL_TABLET | Freq: Every evening | ORAL | 6 refills | Status: DC

## 2016-08-17 NOTE — Telephone Encounter (Signed)
Informed patient that a nicotine patch was okay.

## 2016-08-17 NOTE — Telephone Encounter (Signed)
Refilled losartan per faxed request

## 2016-08-19 ENCOUNTER — Encounter (HOSPITAL_COMMUNITY): Payer: PRIVATE HEALTH INSURANCE

## 2016-08-21 NOTE — Progress Notes (Signed)
Cardiology Office Note   Date:  08/22/2016   ID:  Erin, [name] 02/23/1961, MRN [id number]  PCP:  Jonathon Bellows, MD  Cardiologist: Johnsie Cancel or Woodroe Chen, NP   Chief Complaint  Patient presents with  . Hospitalization Follow-up  . Cardiomyopathy  . Congestive Heart Failure      History of Present Illness: Erin [name] is a 56 y.o. female who presents for post hospital follow up with hx of ICM, prior NSTEMI, CAD with 2 DES placements, hx of anxiety, hypertension, HLD, GERD, Hx of combined CHF with EF of 30-35%. Patient was seen on consultation for A./C chronic CHF and atypical chest pain. The patient was continued on home medications and started on Ranexa for chest pain.   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 She states she has to rest for several minutes after caring for a patient. She has become very discouraged with her health status, and frequent admissions for heart failure. She is medically complaint and denies chest pain or dyspnea.   Past Medical History:  Diagnosis Date  . Anxiety state 03/25/2016  . Basal cell carcinoma of forehead   . CHF (congestive heart failure) ([place])   . Coronary artery disease    a. 03/11/16 PCI with DES-->Prox/Mid Cx, 03/14/16 PCI with DES x2-->RCA, EF 30-35%  . Essential hypertension   . GERD (gastroesophageal reflux disease)   . History of pneumonia   . Hyperlipidemia   . Ischemic cardiomyopathy   . Mitral regurgitation   . NSTEMI (non-ST elevated myocardial infarction) ([place]) 03/10/2016  . Squamous cell cancer of skin of nose   . Thrombocytosis ([place]) 03/26/2016  . Wears dentures   . Wears glasses     Past Surgical History:  Procedure Laterality Date  . APPENDECTOMY    . CARDIAC CATHETERIZATION N/A 03/11/2016   Procedure: Left Heart Cath and Coronary Angiography;  Surgeon: Leonie Man, MD;  Location: [facility];  Service: Cardiovascular;   Laterality: N/A;  . CARDIAC CATHETERIZATION N/A 03/11/2016   Procedure: Coronary Stent Intervention;  Surgeon: Leonie Man, MD;  Location: [facility];  Service: Cardiovascular;  Laterality: N/A;  . CARDIAC CATHETERIZATION N/A 03/14/2016   Procedure: Coronary Stent Intervention;  Surgeon: Peter M Martinique, MD;  Location: [facility];  Service: Cardiovascular;  Laterality: N/A;  . CHOLECYSTECTOMY OPEN  1984  . CORONARY ANGIOPLASTY WITH STENT PLACEMENT  03/14/2016  . FINGER ARTHROPLASTY Left 05/14/2013   Procedure: LEFT THUMB CARPAL METACARPAL ARTHROPLASTY;  Surgeon: Tennis Must, MD;  Location: [facility];  Service: Orthopedics;  Laterality: Left;  . TUBAL LIGATION  1987  . VAGINAL HYSTERECTOMY  2009     Current Outpatient Prescriptions  Medication Sig Dispense Refill  . acetaminophen (TYLENOL) 500 MG tablet Take 1 tablet (500 mg total) by mouth every 8 (eight) hours as needed for mild pain, moderate pain or headache. 30 tablet 0  . aspirin EC 81 MG tablet Take 81 mg by mouth daily.    [name] Kitchen atorvastatin (LIPITOR) 80 MG tablet Take 1 tablet (80 mg total) by mouth daily. 90 tablet 3  . calcium carbonate (TUMS - DOSED IN MG ELEMENTAL CALCIUM) 500 MG chewable tablet Chew 1 tablet by mouth daily as needed for indigestion or heartburn.    . carvedilol (COREG) 3.125 MG tablet Take 1 tablet (3.125 mg total) by mouth 2 (two) times daily  with a meal. 60 tablet 0  . clopidogrel (PLAVIX) 75 MG tablet Take 1 tablet (75 mg total) by mouth daily. 90 tablet 3  . furosemide (LASIX) 20 MG tablet Take 2 tablets (40 mg total) by mouth daily. 60 tablet 6  . LORazepam (ATIVAN) 0.5 MG tablet Take 1 tablet (0.5 mg total) by mouth every 12 (twelve) hours as needed for anxiety. DO NOT DRIVE WHEN TAKING THIS MEDICATION. (Patient taking differently: Take 0.5 mg by mouth at bedtime. DO NOT DRIVE WHEN TAKING THIS MEDICATION.) 20 tablet 0  . losartan (COZAAR) 25 MG tablet Take 0.5 tablets (12.5 mg  total) by mouth every evening. 135 tablet 3  . nitroGLYCERIN (NITROSTAT) 0.4 MG SL tablet Place 1 tablet (0.4 mg total) under the tongue every 5 (five) minutes x 3 doses as needed for chest pain. 25 tablet 3  . pantoprazole (PROTONIX) 20 MG tablet Take 20 mg by mouth 2 (two) times daily.    . potassium chloride SA (K-DUR,KLOR-CON) 20 MEQ tablet Take 1 tablet (20 mEq total) by mouth daily. 30 tablet 0  . ranolazine (RANEXA) 500 MG 12 hr tablet Take 1 tablet (500 mg total) by mouth 2 (two) times daily. 60 tablet 6   No current facility-administered medications for this visit.     Allergies:   Patient has no known allergies.    Social History:  The patient  reports that she has quit smoking. Her smoking use included Cigarettes. She has a 15.00 pack-year smoking history. She has never used smokeless tobacco. She reports that she drinks about 3.6 oz of alcohol per week . She reports that she uses drugs, including Marijuana and MDMA (Ecstacy).   Family History:  The patient's family history includes Diabetes in her mother; Heart failure in her other; Hypertension in her mother; Stroke in her mother.    ROS: All other systems are reviewed and negative. Unless otherwise mentioned in H&P    PHYSICAL EXAM: VS:  BP 118/72   Pulse 89   Ht 5' (1.524 m)   Wt 112 lb (50.8 kg)   SpO2 98%   BMI 21.87 kg/m  , BMI Body mass index is 21.87 kg/m. GEN: Well nourished, well developed, in no acute distress  HEENT: normal  Neck: no JVD, carotid bruits, or masses Cardiac: RRR; slightly tachycardic, no murmurs, rubs, or gallops,no edema  Respiratory:  clear to auscultation bilaterally, normal work of breathing GI: soft, nontender, nondistended, + BS MS: no deformity or atrophy  Skin: warm and dry, no rash Neuro:  Strength and sensation are intact Psych: euthymic mood, tearful   Recent Labs: 07/07/2016: NT-Pro BNP 910 08/04/2016: ALT 16; B Natriuretic Peptide 332.0; BUN 13; Creatinine, Ser 0.85; Magnesium  1.9; Potassium 4.3; Sodium 135; TSH 0.944 08/05/2016: Hemoglobin 11.2; Platelets 334    Lipid Panel    Component Value Date/Time   CHOL 107 05/17/2016 0536   TRIG 168 (H) 05/17/2016 0536   HDL 41 05/17/2016 0536   CHOLHDL 2.6 05/17/2016 0536   VLDL 34 05/17/2016 0536   LDLCALC 32 05/17/2016 0536      Wt Readings from Last 3 Encounters:  08/22/16 112 lb (50.8 kg)  08/05/16 116 lb 2.9 oz (52.7 kg)  07/07/16 112 lb (50.8 kg)      Other studies Reviewed:  Stress Test 08/05/2016 Study Result    There was no ST segment deviation noted during stress.  Findings consistent with prior large inferolateral myocardial infarction. There is no significant peri-infarct ischemia.  This is a high risk study. High risk study based on low LVEF. There is no signifiacnt myocardium currently at jeopardy.  The left ventricular ejection fraction is severely decreased (<30%).     Echocardiogram 05/16/2016 Left ventricle: The cavity size was normal. Wall thickness was   increased in a pattern of mild LVH. Systolic function was   moderately reduced. The estimated ejection fraction was in the   range of 35% to 40%. There is akinesis of the   basal-midinferolateral, inferior, and inferoseptal myocardium.   Doppler parameters are consistent with abnormal left ventricular   relaxation (grade 1 diastolic dysfunction). - Aortic valve: Mildly calcified annulus. Trileaflet. - Mitral valve: There was mild regurgitation. - Tricuspid valve: There was trivial regurgitation. - Pulmonary arteries: PA peak pressure: 22 mm Hg (S). - Pericardium, extracardiac: A small to moderate pericardial   effusion was identified circumferential to the heart. No obvious   right ventricular compromise. Mitral inflow with respiration does   not suggest definitive tamponade physiology.  Cardiac Cath 03/14/2016 Conclusion     Mid Cx lesion, 0 %stenosed.  A drug eluting .  A STENT PROMUS PREM MR 2.25X16 drug eluting  stent was successfully placed.  A STENT PROMUS PREM MR 2.25X8 drug-eluting stent was successfully placed.  Dist RCA lesion, 60 %stenosed.  Post intervention, there is a 0% residual stenosis.  Post Atrio lesion, 0 %stenosed.  Post intervention, there is a 0% residual stenosis.  A stent was successfully placed.   1. Successful Stenting of the distal RCA/PDA with DES x 2.     ASSESSMENT AND PLAN:  1.  Ischemic cardiomyopathy:  Last echo revealed EF of 35%-40%. She is very symptomatic with this, to include profound fatigue and dyspnea. Repeat cardiac MPI revealed no new areas of ischemia. She is requesting leave from work as she is unable to perform her duties.   I have given her a letter for leave from work for 3 months. She will have repeat echo in 6 weeks. She will continue carvedilol, ARB, lasix, ASA. May consider increasing carvedilol dose to 6.25 mg BID as HR is elevated on this visit. Will recheck. She is emotional and tearful in clinic today. Will need to optimize medical management.   2. CAD: DES to distal RCA and PDA. She will remain on DAPT at least until November of 2018.  Continue statin.   3. Chronic Systolic CHF: No evidence of decompensation at this time. Continue diuretics and low sodium diet. .    Current medicines are reviewed at length with the patient today.    Labs/ tests ordered today include:  No orders of the defined types were placed in this encounter.    Disposition:   FU with 6 weeks.   Signed, Jory Sims, NP  08/22/2016 4:50 PM    [facility] [address] [place], [place] [address] Phone: [phone]; Fax: [phone]

## 2016-08-22 ENCOUNTER — Encounter: Payer: Self-pay | Admitting: Adult Health

## 2016-08-22 ENCOUNTER — Encounter (HOSPITAL_COMMUNITY): Payer: PRIVATE HEALTH INSURANCE

## 2016-08-22 ENCOUNTER — Encounter: Payer: Self-pay | Admitting: *Deleted

## 2016-08-22 ENCOUNTER — Ambulatory Visit (INDEPENDENT_AMBULATORY_CARE_PROVIDER_SITE_OTHER): Payer: PRIVATE HEALTH INSURANCE | Admitting: Adult Health

## 2016-08-22 VITALS — BP 118/72 | HR 89 | Ht 60.0 in | Wt 112.0 lb

## 2016-08-22 DIAGNOSIS — I43 Cardiomyopathy in diseases classified elsewhere: Secondary | ICD-10-CM | POA: Diagnosis not present

## 2016-08-22 DIAGNOSIS — I5042 Chronic combined systolic (congestive) and diastolic (congestive) heart failure: Secondary | ICD-10-CM

## 2016-08-22 DIAGNOSIS — I2510 Atherosclerotic heart disease of native coronary artery without angina pectoris: Secondary | ICD-10-CM | POA: Diagnosis not present

## 2016-08-22 MED ORDER — RANOLAZINE ER 500 MG PO TB12: 500.0000 mg | ORAL_TABLET | Freq: Two times a day (BID) | ORAL | 6 refills | Status: DC

## 2016-08-22 MED ORDER — LOSARTAN POTASSIUM 25 MG PO TABS: 12.5000 mg | ORAL_TABLET | Freq: Every evening | ORAL | 3 refills | Status: DC

## 2016-08-22 NOTE — Patient Instructions (Signed)
Your physician recommends that you schedule a follow-up appointment in: 3 Months.   Your physician recommends that you continue on your current medications as directed. Please refer to the Current Medication list given to you today.  You have been given a note for work.   If you need a refill on your cardiac medications before your next appointment, please call your pharmacy.  Thank you for choosing [facility]!

## 2016-08-22 NOTE — Progress Notes (Signed)
Name: Erin [name]    DOB: 09-22-1960  Age: 56 y.o.  MR#: [id number]       PCP:  Jonathon Bellows, MD      Insurance: Payor: MEDCOST / Plan: MEDCOST / Product Type: *No Product type* /   CC:   No chief complaint on file.   VS Vitals:   08/22/16 1522  Pulse: 89  SpO2: 98%  Weight: 112 lb (50.8 kg)  Height: 5' (1.524 m)    Weights Current Weight  08/22/16 112 lb (50.8 kg)  08/05/16 116 lb 2.9 oz (52.7 kg)  07/07/16 112 lb (50.8 kg)    Blood Pressure  BP Readings from Last 3 Encounters:  08/05/16 103/76  07/07/16 126/76  07/05/16 122/76     Admit date:  (Not on file) Last encounter with RMR:  06/02/2016   Allergy Patient has no known allergies.  Current Outpatient Prescriptions  Medication Sig Dispense Refill  . acetaminophen (TYLENOL) 500 MG tablet Take 1 tablet (500 mg total) by mouth every 8 (eight) hours as needed for mild pain, moderate pain or headache. 30 tablet 0  . aspirin EC 81 MG tablet Take 81 mg by mouth daily.    [name] Kitchen atorvastatin (LIPITOR) 80 MG tablet Take 1 tablet (80 mg total) by mouth daily. 90 tablet 3  . calcium carbonate (TUMS - DOSED IN MG ELEMENTAL CALCIUM) 500 MG chewable tablet Chew 1 tablet by mouth daily as needed for indigestion or heartburn.    . carvedilol (COREG) 3.125 MG tablet Take 1 tablet (3.125 mg total) by mouth 2 (two) times daily with a meal. 60 tablet 0  . clopidogrel (PLAVIX) 75 MG tablet Take 1 tablet (75 mg total) by mouth daily. 90 tablet 3  . furosemide (LASIX) 20 MG tablet Take 2 tablets (40 mg total) by mouth daily. 60 tablet 6  . LORazepam (ATIVAN) 0.5 MG tablet Take 1 tablet (0.5 mg total) by mouth every 12 (twelve) hours as needed for anxiety. DO NOT DRIVE WHEN TAKING THIS MEDICATION. (Patient taking differently: Take 0.5 mg by mouth at bedtime. DO NOT DRIVE WHEN TAKING THIS MEDICATION.) 20 tablet 0  . losartan (COZAAR) 25 MG tablet Take 0.5 tablets (12.5 mg total) by mouth every evening. 45 tablet 6  . nitroGLYCERIN (NITROSTAT)  0.4 MG SL tablet Place 1 tablet (0.4 mg total) under the tongue every 5 (five) minutes x 3 doses as needed for chest pain. 25 tablet 3  . pantoprazole (PROTONIX) 20 MG tablet Take 20 mg by mouth 2 (two) times daily.    . potassium chloride SA (K-DUR,KLOR-CON) 20 MEQ tablet Take 1 tablet (20 mEq total) by mouth daily. 30 tablet 0  . ranolazine (RANEXA) 500 MG 12 hr tablet Take 1 tablet (500 mg total) by mouth 2 (two) times daily. 30 tablet 0   No current facility-administered medications for this visit.     Discontinued Meds:   There are no discontinued medications.  Patient Active Problem List   Diagnosis Date Noted  . Chronic combined systolic and diastolic CHF (congestive heart failure) ([place]) 06/20/2016  . Hypokalemia   . Acute CHF (congestive heart failure) ([place]) 05/16/2016  . Acute on chronic systolic CHF (congestive heart failure) ([place]) 05/16/2016  . Acute respiratory failure with hypoxia ([place])   . Thrombocytosis ([place]) 03/26/2016  . Cardiomyopathy, ischemic 03/25/2016  . Acute on chronic combined systolic and diastolic congestive heart failure ([place]) 03/25/2016  . Anxiety state 03/25/2016  . Troponin level elevated 03/25/2016  .  CAD (coronary artery disease) 03/25/2016  . Normocytic anemia 03/25/2016  . SOB (shortness of breath) 03/24/2016  . Lightheadedness 03/17/2016  . Hypotension 03/17/2016  . Tobacco abuse 03/12/2016  . NSTEMI (non-ST elevated myocardial infarction) ([place]) 03/11/2016  . Pain in the chest   . Essential hypertension 09/06/2015  . Hyperlipidemia 09/06/2015  . GERD (gastroesophageal reflux disease) 09/06/2015  . Chest pain 09/06/2015    LABS    Component Value Date/Time   NA 135 08/04/2016 1840   NA 138 08/04/2016 0250   NA 140 07/20/2016 1327   NA 139 07/05/2016 1155   K 4.3 08/04/2016 1840   K 3.7 08/04/2016 0957   K 2.6 (LL) 08/04/2016 0250   CL 106 08/04/2016 1840   CL 103 08/04/2016 0250   CL 100 07/20/2016 1327   CO2 24 08/04/2016 1840    CO2 26 08/04/2016 0250   CO2 25 07/20/2016 1327   GLUCOSE 101 (H) 08/04/2016 1840   GLUCOSE 102 (H) 08/04/2016 0250   GLUCOSE 126 (H) 07/20/2016 1327   GLUCOSE 90 07/05/2016 1155   BUN 13 08/04/2016 1840   BUN 13 08/04/2016 0250   BUN 12 07/20/2016 1327   BUN 17 07/05/2016 1155   CREATININE 0.85 08/04/2016 1840   CREATININE 0.72 08/04/2016 0250   CREATININE 0.86 07/20/2016 1327   CREATININE 0.73 05/13/2016 1020   CALCIUM 8.5 (L) 08/04/2016 1840   CALCIUM 9.3 08/04/2016 0250   CALCIUM 9.6 07/20/2016 1327   GFRNONAA >60 08/04/2016 1840   GFRNONAA >60 08/04/2016 0250   GFRNONAA 77 07/20/2016 1327   GFRAA >60 08/04/2016 1840   GFRAA >60 08/04/2016 0250   GFRAA 89 07/20/2016 1327   CMP     Component Value Date/Time   NA 135 08/04/2016 1840   NA 140 07/20/2016 1327   K 4.3 08/04/2016 1840   CL 106 08/04/2016 1840   CO2 24 08/04/2016 1840   GLUCOSE 101 (H) 08/04/2016 1840   BUN 13 08/04/2016 1840   BUN 12 07/20/2016 1327   CREATININE 0.85 08/04/2016 1840   CREATININE 0.73 05/13/2016 1020   CALCIUM 8.5 (L) 08/04/2016 1840   PROT 6.4 (L) 08/04/2016 0250   ALBUMIN 3.6 08/04/2016 0250   AST 18 08/04/2016 0250   ALT 16 08/04/2016 0250   ALKPHOS 62 08/04/2016 0250   BILITOT 0.8 08/04/2016 0250   GFRNONAA >60 08/04/2016 1840   GFRAA >60 08/04/2016 1840       Component Value Date/Time   WBC 8.0 08/05/2016 0448   WBC 9.1 08/04/2016 0250   WBC 8.2 07/05/2016 1155   HGB 11.2 (L) 08/05/2016 1309   HGB 9.7 (L) 08/05/2016 0448   HGB 11.3 (L) 08/04/2016 0250   HCT 33.6 (L) 08/05/2016 1309   HCT 30.2 (L) 08/05/2016 0448   HCT 33.2 (L) 08/04/2016 0250   MCV 84.4 08/05/2016 0448   MCV 81.8 08/04/2016 0250   MCV 82.4 07/05/2016 1155    Lipid Panel     Component Value Date/Time   CHOL 107 05/17/2016 0536   TRIG 168 (H) 05/17/2016 0536   HDL 41 05/17/2016 0536   CHOLHDL 2.6 05/17/2016 0536   VLDL 34 05/17/2016 0536   LDLCALC 32 05/17/2016 0536    ABG    Component Value  Date/Time   TCO2 22 03/11/2016 1649     Lab Results  Component Value Date   TSH 0.944 08/04/2016   BNP (last 3 results)  Recent Labs  05/16/16 0317 06/20/16 0408 08/04/16 0250  BNP  783.0* 148.0* 332.0*    ProBNP (last 3 results)  Recent Labs  07/07/16 0900  PROBNP 910*    Cardiac Panel (last 3 results) No results for input(s): CKTOTAL, CKMB, TROPONINI, RELINDX in the last 72 hours.  Iron/TIBC/Ferritin/ %Sat    Component Value Date/Time   IRON 10 (L) 03/18/2016 0940   TIBC 252 03/18/2016 0940   FERRITIN 356 (H) 03/18/2016 0940   IRONPCTSAT 4 (L) 03/18/2016 0940     EKG Orders placed or performed during the hospital encounter of 08/04/16  . EKG 12-Lead  . EKG 12-Lead  . EKG 12-Lead  . EKG 12-Lead  . EKG     Prior Assessment and Plan Problem List as of 08/22/2016 Reviewed: 06/02/2016  2:21 PM by Jory Sims, NP     Cardiovascular and Mediastinum   Essential hypertension   NSTEMI (non-ST elevated myocardial infarction) ([place])   Hypotension   Cardiomyopathy, ischemic   Acute on chronic combined systolic and diastolic congestive heart failure ([facility])   CAD (coronary artery disease)   Acute CHF (congestive heart failure) ([facility])   Acute on chronic systolic CHF (congestive heart failure) ([facility])   Chronic combined systolic and diastolic CHF (congestive heart failure) ([facility])     Respiratory   Acute respiratory failure with hypoxia ([facility])     Digestive   GERD (gastroesophageal reflux disease)     Hematopoietic and Hemostatic   Thrombocytosis ([facility])     Other   Hyperlipidemia   Chest pain   Pain in the chest   Tobacco abuse   Lightheadedness   SOB (shortness of breath)   Anxiety state   Troponin level elevated   Normocytic anemia   Hypokalemia       Imaging: Dg Chest 2 View  Result Date: 08/04/2016 CLINICAL DATA:  56 y/o  F; chest pressure with shortness of breath. EXAM: CHEST  2 VIEW COMPARISON:  07/05/2014 chest radiograph. FINDINGS: Stable heart  size and mediastinal contours are within normal limits. Both lungs are clear. Mild degenerative changes of the thoracic spine. Right upper quadrant cholecystectomy clips. IMPRESSION: No acute pulmonary process identified. Electronically Signed   By: Kristine Garbe M.D.   On: 08/04/2016 03:39   Nm Myocar Multi W/spect W/wall Motion / Ef  Result Date: 08/05/2016  There was no ST segment deviation noted during stress.  Findings consistent with prior large inferolateral myocardial infarction. There is no significant peri-infarct ischemia.  This is a high risk study. High risk study based on low LVEF. There is no signifiacnt myocardium currently at jeopardy.  The left ventricular ejection fraction is severely decreased (<30%).

## 2016-08-23 NOTE — Addendum Note (Signed)
Addended by: Levonne Hubert on: 08/23/2016 08:32 AM   Modules accepted: Orders

## 2016-08-24 ENCOUNTER — Encounter (HOSPITAL_COMMUNITY): Payer: PRIVATE HEALTH INSURANCE

## 2016-08-26 ENCOUNTER — Encounter (HOSPITAL_COMMUNITY): Payer: PRIVATE HEALTH INSURANCE

## 2016-08-29 ENCOUNTER — Encounter (HOSPITAL_COMMUNITY): Payer: PRIVATE HEALTH INSURANCE

## 2016-08-29 ENCOUNTER — Ambulatory Visit (HOSPITAL_COMMUNITY): Payer: PRIVATE HEALTH INSURANCE | Attending: Adult Health

## 2016-09-04 ENCOUNTER — Emergency Department (HOSPITAL_COMMUNITY)
Admission: EM | Admit: 2016-09-04 | Discharge: 2016-09-04 | Disposition: A | Discharge status: Home / Self Care | Payer: PRIVATE HEALTH INSURANCE | Attending: Emergency Medicine | Admitting: Emergency Medicine

## 2016-09-04 ENCOUNTER — Encounter (HOSPITAL_COMMUNITY): Payer: Self-pay | Admitting: *Deleted

## 2016-09-04 ENCOUNTER — Emergency Department (HOSPITAL_COMMUNITY): Payer: PRIVATE HEALTH INSURANCE

## 2016-09-04 DIAGNOSIS — I110 Hypertensive heart disease with heart failure: Secondary | ICD-10-CM | POA: Diagnosis not present

## 2016-09-04 DIAGNOSIS — Z7982 Long term (current) use of aspirin: Secondary | ICD-10-CM | POA: Diagnosis not present

## 2016-09-04 DIAGNOSIS — Z87891 Personal history of nicotine dependence: Secondary | ICD-10-CM | POA: Insufficient documentation

## 2016-09-04 DIAGNOSIS — I5042 Chronic combined systolic (congestive) and diastolic (congestive) heart failure: Secondary | ICD-10-CM | POA: Insufficient documentation

## 2016-09-04 DIAGNOSIS — R079 Chest pain, unspecified: Secondary | ICD-10-CM

## 2016-09-04 DIAGNOSIS — I209 Angina pectoris, unspecified: Secondary | ICD-10-CM

## 2016-09-04 DIAGNOSIS — R350 Frequency of micturition: Secondary | ICD-10-CM | POA: Diagnosis not present

## 2016-09-04 DIAGNOSIS — Z79899 Other long term (current) drug therapy: Secondary | ICD-10-CM | POA: Insufficient documentation

## 2016-09-04 DIAGNOSIS — R0789 Other chest pain: Secondary | ICD-10-CM | POA: Diagnosis present

## 2016-09-04 LAB — URINALYSIS, ROUTINE W REFLEX MICROSCOPIC
Bacteria, UA: NONE SEEN
Bilirubin Urine: NEGATIVE
Glucose, UA: NEGATIVE mg/dL
Hgb urine dipstick: NEGATIVE
Ketones, ur: NEGATIVE mg/dL
Nitrite: NEGATIVE
Protein, ur: NEGATIVE mg/dL
Specific Gravity, Urine: 1.023 (ref 1.005–1.030)
pH: 5 (ref 5.0–8.0)

## 2016-09-04 LAB — CBC WITH DIFFERENTIAL/PLATELET
Basophils Absolute: 0 10*3/uL (ref 0.0–0.1)
Basophils Relative: 0 %
Eosinophils Absolute: 0.2 10*3/uL (ref 0.0–0.7)
Eosinophils Relative: 2 %
HCT: 34.6 % — ABNORMAL LOW (ref 36.0–46.0)
Hemoglobin: 11.9 g/dL — ABNORMAL LOW (ref 12.0–15.0)
Lymphocytes Relative: 29 %
Lymphs Abs: 2.8 10*3/uL (ref 0.7–4.0)
MCH: 28.6 pg (ref 26.0–34.0)
MCHC: 34.4 g/dL (ref 30.0–36.0)
MCV: 83.2 fL (ref 78.0–100.0)
Monocytes Absolute: 0.8 10*3/uL (ref 0.1–1.0)
Monocytes Relative: 8 %
Neutro Abs: 5.8 10*3/uL (ref 1.7–7.7)
Neutrophils Relative %: 61 %
Platelets: 382 10*3/uL (ref 150–400)
RBC: 4.16 MIL/uL (ref 3.87–5.11)
RDW: 16.6 % — ABNORMAL HIGH (ref 11.5–15.5)
WBC: 9.5 10*3/uL (ref 4.0–10.5)

## 2016-09-04 LAB — TROPONIN I
Troponin I: <0.03 ng/mL (ref <0.03)
Troponin I: <0.03 ng/mL (ref <0.03)

## 2016-09-04 LAB — BASIC METABOLIC PANEL
Anion gap: 6 (ref 5–15)
BUN: 17 mg/dL (ref 6–20)
CO2: 26 mmol/L (ref 22–32)
Calcium: 9.1 mg/dL (ref 8.9–10.3)
Chloride: 106 mmol/L (ref 101–111)
Creatinine, Ser: 0.85 mg/dL (ref 0.44–1.00)
GFR calc Af Amer: >60 mL/min (ref >60)
GFR calc non Af Amer: >60 mL/min (ref >60)
Glucose, Bld: 102 mg/dL — ABNORMAL HIGH (ref 65–99)
Potassium: 3.8 mmol/L (ref 3.5–5.1)
Sodium: 138 mmol/L (ref 135–145)

## 2016-09-04 LAB — BRAIN NATRIURETIC PEPTIDE: B Natriuretic Peptide: 143 pg/mL — ABNORMAL HIGH (ref 0.0–100.0)

## 2016-09-04 MED ORDER — NITROGLYCERIN 0.4 MG SL SUBL
0.4000 mg | SUBLINGUAL_TABLET | Freq: Once | SUBLINGUAL | Status: AC
Start: 2016-09-04 — End: 2016-09-04
  Administered 2016-09-04: 0.4 mg via SUBLINGUAL
  Filled 2016-09-04: qty 1

## 2016-09-04 MED ORDER — ASPIRIN 81 MG PO CHEW
324.0000 mg | CHEWABLE_TABLET | Freq: Once | ORAL | Status: AC
  Administered 2016-09-04: 324 mg via ORAL
  Filled 2016-09-04: qty 4

## 2016-09-04 NOTE — ED Triage Notes (Signed)
Pt reports she takes lasix & not using the restroom like normally.

## 2016-09-04 NOTE — Discharge Instructions (Signed)
If your chest pain recurs, take nitroglycerin as directed and return to the emergency department. Your cardiologist's office will call you to schedule an appointment. If you don't hear from the office by noon tomorrow, call the office to schedule an appointment for this week and tell office staff that [place] spoke with Dr.Nishan about your case and that you should be seen this week.

## 2016-09-04 NOTE — ED Notes (Signed)
Spoke with lab about troponin and it is being tested.

## 2016-09-04 NOTE — ED Notes (Signed)
Bladder Scan: 155mL

## 2016-09-04 NOTE — ED Provider Notes (Addendum)
8:05 AM patient asymptomatic. Complained of chest tightness which awakened her from sleep 2 AM today. Was alleviated after she was treated with aspirin and one sublingual nitroglycerin while here. She is presently asymptomatic. She reports she gets similar symptoms approximately once per month. Exam alert and in no distress lungs clear auscultation heart regular rate and rhythm . Patient had nuclear stress test March 2018 ejection fraction of 30%, no myocardium at jeopardy no significant peri-infarct ischemia.  9:30 AM patient remains asymptomatic. She feels ready to go home. I discussed case with [facility]. Office will call patient tomorrow for follow-up.  Results for orders placed or performed during the hospital encounter of 09/04/16  Urinalysis, Routine w reflex microscopic  Result Value Ref Range   Color, Urine YELLOW YELLOW   APPearance HAZY (A) CLEAR   Specific Gravity, Urine 1.023 1.005 - 1.030   pH 5.0 5.0 - 8.0   Glucose, UA NEGATIVE NEGATIVE mg/dL   Hgb urine dipstick NEGATIVE NEGATIVE   Bilirubin Urine NEGATIVE NEGATIVE   Ketones, ur NEGATIVE NEGATIVE mg/dL   Protein, ur NEGATIVE NEGATIVE mg/dL   Nitrite NEGATIVE NEGATIVE   Leukocytes, UA TRACE (A) NEGATIVE   RBC / HPF 0-5 0 - 5 RBC/hpf   WBC, UA 0-5 0 - 5 WBC/hpf   Bacteria, UA NONE SEEN NONE SEEN   Squamous Epithelial / LPF 0-5 (A) NONE SEEN   Mucous PRESENT    Ca Oxalate Crys, UA PRESENT   CBC with Differential/Platelet  Result Value Ref Range   WBC 9.5 4.0 - 10.5 K/uL   RBC 4.16 3.87 - 5.11 MIL/uL   Hemoglobin 11.9 (L) 12.0 - 15.0 g/dL   HCT 34.6 (L) 36.0 - 46.0 %   MCV 83.2 78.0 - 100.0 fL   MCH 28.6 26.0 - 34.0 pg   MCHC 34.4 30.0 - 36.0 g/dL   RDW 16.6 (H) 11.5 - 15.5 %   Platelets 382 150 - 400 K/uL   Neutrophils Relative % 61 %   Neutro Abs 5.8 1.7 - 7.7 K/uL   Lymphocytes Relative 29 %   Lymphs Abs 2.8 0.7 - 4.0 K/uL   Monocytes Relative 8 %   Monocytes Absolute 0.8 0.1 - 1.0 K/uL    Eosinophils Relative 2 %   Eosinophils Absolute 0.2 0.0 - 0.7 K/uL   Basophils Relative 0 %   Basophils Absolute 0.0 0.0 - 0.1 K/uL  Basic metabolic panel  Result Value Ref Range   Sodium 138 135 - 145 mmol/L   Potassium 3.8 3.5 - 5.1 mmol/L   Chloride 106 101 - 111 mmol/L   CO2 26 22 - 32 mmol/L   Glucose, Bld 102 (H) 65 - 99 mg/dL   BUN 17 6 - 20 mg/dL   Creatinine, Ser 0.85 0.44 - 1.00 mg/dL   Calcium 9.1 8.9 - 10.3 mg/dL   GFR calc non Af Amer >60 >60 mL/min   GFR calc Af Amer >60 >60 mL/min   Anion gap 6 5 - 15  Brain natriuretic peptide  Result Value Ref Range   B Natriuretic Peptide 143.0 (H) 0.0 - 100.0 pg/mL  Troponin I  Result Value Ref Range   Troponin I <0.03 <0.03 ng/mL  Troponin I  Result Value Ref Range   Troponin I <0.03 <0.03 ng/mL   Dg Chest 2 View  Result Date: 09/04/2016 CLINICAL DATA:  Shortness of breath.  Previous myocardial infarct. EXAM: CHEST  2 VIEW COMPARISON:  08/04/2016 FINDINGS: The heart size  and mediastinal contours are within normal limits. Both lungs are clear. The visualized skeletal structures are unremarkable. IMPRESSION: No active cardiopulmonary disease. Electronically Signed   By: Earle Gell M.D.   On: 09/04/2016 07:54   Nm Myocar Multi W/spect W/wall Motion / Ef  Result Date: 08/05/2016  There was no ST segment deviation noted during stress.  Findings consistent with prior large inferolateral myocardial infarction. There is no significant peri-infarct ischemia.  This is a high risk study. High risk study based on low LVEF. There is no signifiacnt myocardium currently at jeopardy.  The left ventricular ejection fraction is severely decreased (<30%).   Chest x-ray viewed by me   Patient may have experienced angina however she gets similar episodes about once per month. Considered to be stable. She has nitroglycerin at home. She is advised to return to the emergency department if symptoms worsen Orlie Dakin, MD 09/04/16 [place], MD 09/04/16 1536

## 2016-09-04 NOTE — ED Provider Notes (Signed)
[facility] DEPT Provider Note   CSN: [id number] Arrival date & time: 09/04/16  0438     History   Chief Complaint Chief Complaint  Patient presents with  . Urinary Retention    HPI Erin [name] is a 56 y.o. female.  Patient reports sensation of not being able to empty her bladder since 10 PM last night. Feels like she needs to void but only Z[place] to go small amount has constant urgency and frequency. She takes Lasix 40 mg daily at 9 AM and states compliance. She denies any pain when she urinates or any hematuria. No fever or vomiting. Bladder scan performed on arrival shows 123 mL. On further questioning, patient states she is having worsening shortness of breath as well as some chest pressure that has been constant since 2:30 AM. His is dissimilar to her previous cardiac type pain. She denies any cough or fever. She does have associated nausea, shortness of breath and diaphoresis. No PND or orthopnea.  with a hx of CAD NSTEMI 03/2016 DESplaced over previously placed stent prox-med CFX. And staged DES RCA right PDA 03/14/16. Cath showed severe MR but echo EF 30-35% grade 2 DD and mild MR. ICM, HTN, previous smoker.   The history is provided by the patient.    Past Medical History:  Diagnosis Date  . Anxiety state 03/25/2016  . Basal cell carcinoma of forehead   . CHF (congestive heart failure) ([place])   . Coronary artery disease    a. 03/11/16 PCI with DES-->Prox/Mid Cx, 03/14/16 PCI with DES x2-->RCA, EF 30-35%  . Essential hypertension   . GERD (gastroesophageal reflux disease)   . History of pneumonia   . Hyperlipidemia   . Ischemic cardiomyopathy   . Mitral regurgitation   . NSTEMI (non-ST elevated myocardial infarction) ([place]) 03/10/2016  . Squamous cell cancer of skin of nose   . Thrombocytosis ([place]) 03/26/2016  . Wears dentures   . Wears glasses     Patient Active Problem List   Diagnosis Date Noted  . Chronic combined systolic and diastolic CHF (congestive  heart failure) ([place]) 06/20/2016  . Hypokalemia   . Acute CHF (congestive heart failure) ([place]) 05/16/2016  . Acute on chronic systolic CHF (congestive heart failure) ([place]) 05/16/2016  . Acute respiratory failure with hypoxia ([place])   . Thrombocytosis ([place]) 03/26/2016  . Cardiomyopathy, ischemic 03/25/2016  . Acute on chronic combined systolic and diastolic congestive heart failure ([place]) 03/25/2016  . Anxiety state 03/25/2016  . Troponin level elevated 03/25/2016  . CAD (coronary artery disease) 03/25/2016  . Normocytic anemia 03/25/2016  . SOB (shortness of breath) 03/24/2016  . Lightheadedness 03/17/2016  . Hypotension 03/17/2016  . Tobacco abuse 03/12/2016  . NSTEMI (non-ST elevated myocardial infarction) ([place]) 03/11/2016  . Pain in the chest   . Essential hypertension 09/06/2015  . Hyperlipidemia 09/06/2015  . GERD (gastroesophageal reflux disease) 09/06/2015  . Chest pain 09/06/2015    Past Surgical History:  Procedure Laterality Date  . APPENDECTOMY    . CARDIAC CATHETERIZATION N/A 03/11/2016   Procedure: Left Heart Cath and Coronary Angiography;  Surgeon: Leonie Man, MD;  Location: [facility];  Service: Cardiovascular;  Laterality: N/A;  . CARDIAC CATHETERIZATION N/A 03/11/2016   Procedure: Coronary Stent Intervention;  Surgeon: Leonie Man, MD;  Location: [facility];  Service: Cardiovascular;  Laterality: N/A;  . CARDIAC CATHETERIZATION N/A 03/14/2016   Procedure: Coronary Stent Intervention;  Surgeon: Peter M Martinique, MD;  Location: [facility];  Service: Cardiovascular;  Laterality: N/A;  . CHOLECYSTECTOMY OPEN  1984  . CORONARY ANGIOPLASTY WITH STENT PLACEMENT  03/14/2016  . FINGER ARTHROPLASTY Left 05/14/2013   Procedure: LEFT THUMB CARPAL METACARPAL ARTHROPLASTY;  Surgeon: Tennis Must, MD;  Location: [facility];  Service: Orthopedics;  Laterality: Left;  . TUBAL LIGATION  1987  . VAGINAL HYSTERECTOMY  2009    OB History     Gravida Para Term Preterm AB Living   4 4 4     4    SAB TAB Ectopic Multiple Live Births                   Home Medications    Prior to Admission medications   Medication Sig Start Date End Date Taking? Authorizing Provider  acetaminophen (TYLENOL) 500 MG tablet Take 1 tablet (500 mg total) by mouth every 8 (eight) hours as needed for mild pain, moderate pain or headache. 03/15/16  Yes Cheryln Manly, NP  aspirin EC 81 MG tablet Take 81 mg by mouth daily.   Yes Historical Provider, MD  atorvastatin (LIPITOR) 80 MG tablet Take 1 tablet (80 mg total) by mouth daily. 07/07/16 10/05/16 Yes Eileen Stanford, PA-C  calcium carbonate (TUMS - DOSED IN MG ELEMENTAL CALCIUM) 500 MG chewable tablet Chew 1 tablet by mouth daily as needed for indigestion or heartburn.   Yes Historical Provider, MD  carvedilol (COREG) 3.125 MG tablet Take 1 tablet (3.125 mg total) by mouth 2 (two) times daily with a meal. 08/05/16  Yes Hosie Poisson, MD  clopidogrel (PLAVIX) 75 MG tablet Take 1 tablet (75 mg total) by mouth daily. 06/02/16  Yes Lendon Colonel, NP  furosemide (LASIX) 20 MG tablet Take 2 tablets (40 mg total) by mouth daily. 07/08/16  Yes Eileen Stanford, PA-C  LORazepam (ATIVAN) 0.5 MG tablet Take 1 tablet (0.5 mg total) by mouth every 12 (twelve) hours as needed for anxiety. DO NOT DRIVE WHEN TAKING THIS MEDICATION. Patient taking differently: Take 0.5 mg by mouth at bedtime. DO NOT DRIVE WHEN TAKING THIS MEDICATION. 03/26/16  Yes Rexene Alberts, MD  losartan (COZAAR) 25 MG tablet Take 0.5 tablets (12.5 mg total) by mouth every evening. 08/22/16  Yes Lendon Colonel, NP  nitroGLYCERIN (NITROSTAT) 0.4 MG SL tablet Place 1 tablet (0.4 mg total) under the tongue every 5 (five) minutes x 3 doses as needed for chest pain. 09/06/15  Yes Almyra Deforest, PA  pantoprazole (PROTONIX) 20 MG tablet Take 20 mg by mouth 2 (two) times daily.   Yes Historical Provider, MD  potassium chloride SA (K-DUR,KLOR-CON) 20 MEQ tablet  Take 1 tablet (20 mEq total) by mouth daily. 08/05/16  Yes Hosie Poisson, MD  ranolazine (RANEXA) 500 MG 12 hr tablet Take 1 tablet (500 mg total) by mouth 2 (two) times daily. 08/22/16  Yes Lendon Colonel, NP    Family History Family History  Problem Relation Age of Onset  . Stroke Mother   . Hypertension Mother   . Diabetes Mother   . Heart failure Other     Social History Social History  Substance Use Topics  . Smoking status: Former Smoker    Packs/day: 1.00    Years: 15.00    Types: Cigarettes  . Smokeless tobacco: Never Used     Comment: quit 2 weeks ago-03/24/16  . Alcohol use 3.6 oz/week    6 Cans of beer per week     Comment: occ  Allergies   Patient has no known allergies.   Review of Systems Review of Systems  Constitutional: Positive for activity change, appetite change and fatigue. Negative for fever.  HENT: Negative for congestion and rhinorrhea.   Eyes: Negative for visual disturbance.  Respiratory: Positive for chest tightness and shortness of breath.   Cardiovascular: Negative for chest pain.  Gastrointestinal: Positive for nausea. Negative for abdominal pain and vomiting.  Genitourinary: Positive for decreased urine volume, difficulty urinating, frequency and urgency. Negative for dysuria, flank pain, vaginal bleeding and vaginal discharge.  Musculoskeletal: Negative for arthralgias.  Neurological: Negative for dizziness, weakness, light-headedness and headaches.   all other systems are negative except as noted in the HPI and PMH.     Physical Exam Updated Vital Signs BP 137/87 (BP Location: Left Arm)   Pulse 79   Temp 98.1 F (36.7 C) (Oral)   Resp 18   Ht 5' (1.524 m)   Wt 112 lb (50.8 kg)   SpO2 99%   BMI 21.87 kg/m   Physical Exam  Constitutional: She is oriented to person, place, and time. She appears well-developed and well-nourished. No distress.  HENT:  Head: Normocephalic and atraumatic.  Mouth/Throat: Oropharynx is clear  and moist. No oropharyngeal exudate.  Eyes: Conjunctivae and EOM are normal. Pupils are equal, round, and reactive to light.  Neck: Normal range of motion. Neck supple.  No meningismus.  Cardiovascular: Normal rate, regular rhythm, normal heart sounds and intact distal pulses.   No murmur heard. Pulmonary/Chest: Effort normal and breath sounds normal. No respiratory distress.  Abdominal: Soft. There is no tenderness. There is no rebound and no guarding.  Musculoskeletal: Normal range of motion. She exhibits no edema or tenderness.  Neurological: She is alert and oriented to person, place, and time. No cranial nerve deficit. She exhibits normal muscle tone. Coordination normal.   5/5 strength throughout. CN 2-12 intact.Equal grip strength.   Skin: Skin is warm.  Psychiatric: She has a normal mood and affect. Her behavior is normal.  Nursing note and vitals reviewed.    ED Treatments / Results  Labs (all labs ordered are listed, but only abnormal results are displayed) Labs Reviewed  URINALYSIS, ROUTINE W REFLEX MICROSCOPIC - Abnormal; Notable for the following:       Result Value   APPearance HAZY (*)    Leukocytes, UA TRACE (*)    Squamous Epithelial / LPF 0-5 (*)    All other components within normal limits  CBC WITH DIFFERENTIAL/PLATELET - Abnormal; Notable for the following:    Hemoglobin 11.9 (*)    HCT 34.6 (*)    RDW 16.6 (*)    All other components within normal limits  BASIC METABOLIC PANEL - Abnormal; Notable for the following:    Glucose, Bld 102 (*)    All other components within normal limits  BRAIN NATRIURETIC PEPTIDE  TROPONIN I    EKG  EKG Interpretation  Date/Time:  Sunday September 04 2016 06:43:52 EDT Ventricular Rate:  71 PR Interval:    QRS Duration: 98 QT Interval:  413 QTC Calculation: 449 R Axis:   5 Text Interpretation:  Sinus rhythm Low voltage, extremity leads No significant change was found Confirmed by Wyvonnia Dusky  MD, Erin [name] [phone]) on 09/04/2016  6:47:56 AM       Radiology Dg Chest 2 View  Result Date: 09/04/2016 CLINICAL DATA:  Shortness of breath.  Previous myocardial infarct. EXAM: CHEST  2 VIEW COMPARISON:  08/04/2016 FINDINGS: The heart size and mediastinal contours  are within normal limits. Both lungs are clear. The visualized skeletal structures are unremarkable. IMPRESSION: No active cardiopulmonary disease. Electronically Signed   By: Earle Gell M.D.   On: 09/04/2016 07:54    Procedures Procedures (including critical care time)  Medications Ordered in ED Medications - No data to display   Initial Impression / Assessment and Plan / ED Course  I have reviewed the triage vital signs and the nursing notes.  Pertinent labs & imaging results that were available during my care of the patient were reviewed by me and considered in my medical decision making (see chart for details).     Patient with history of ischemic cardiomyopathy presenting with concern for not being able to urinate since 10 PM. Taking Lasix as prescribed. Only able to urinate small amounts. Bladder scan shows 123 ML's. Urinalysis is negative.  Patient also complains of worsening shortness of breath associated with chest pain nausea and diaphoresis. Her EKG is unchanged.  CXR negative.  Unclear cause of her urinary symptoms. No evidence of urinary retention or UTI. She does not appear volume overloaded.  Troponin pending at time of sign out. Symptoms improved with ASA and NTG. Recent reassuring stress test 08/05/16. 6 hour troponin will be at 830 am. If 6 hour troponin negative, patient can likely be discharged. Anticipate discussion with cardiology.   Final Clinical Impressions(s) / ED Diagnoses   Final diagnoses:  None    New Prescriptions New Prescriptions   No medications on file     Ezequiel Essex, MD 09/04/16 [phone]

## 2016-09-04 NOTE — ED Notes (Signed)
Pt at radiology  ?

## 2016-09-04 NOTE — ED Notes (Signed)
Pt states she normally takes 40mg  lasix once daily @ 0900, she awoke at 2200 feeling as if she needed to void but only voided a sm amt, she voided a second sm amt for urine sample at ED but otherwise has not voided like normal

## 2016-09-05 ENCOUNTER — Encounter (HOSPITAL_COMMUNITY): Payer: PRIVATE HEALTH INSURANCE

## 2016-09-06 ENCOUNTER — Other Ambulatory Visit: Payer: Self-pay | Admitting: Physician Assistant

## 2016-09-07 ENCOUNTER — Other Ambulatory Visit: Payer: Self-pay | Admitting: *Deleted

## 2016-09-07 MED ORDER — POTASSIUM CHLORIDE CRYS ER 20 MEQ PO TBCR: 20.0000 meq | EXTENDED_RELEASE_TABLET | Freq: Every day | ORAL | 6 refills | Status: DC

## 2016-09-07 NOTE — Telephone Encounter (Signed)
Please review for refill, Thanks !  

## 2016-09-07 NOTE — Telephone Encounter (Incomplete)
Please review for refill, Thanks !  

## 2016-09-12 ENCOUNTER — Encounter (HOSPITAL_COMMUNITY): Payer: PRIVATE HEALTH INSURANCE

## 2016-09-19 ENCOUNTER — Encounter (HOSPITAL_COMMUNITY): Payer: PRIVATE HEALTH INSURANCE

## 2016-09-26 ENCOUNTER — Encounter (HOSPITAL_COMMUNITY): Payer: PRIVATE HEALTH INSURANCE

## 2016-10-03 ENCOUNTER — Encounter (HOSPITAL_COMMUNITY): Payer: PRIVATE HEALTH INSURANCE

## 2016-10-06 ENCOUNTER — Telehealth: Payer: Self-pay | Admitting: Adult Health

## 2016-10-06 NOTE — Telephone Encounter (Signed)
Will forward to Dr.Nishan. 

## 2016-10-06 NOTE — Telephone Encounter (Signed)
Routed to wrong person [place] CMA

## 2016-10-06 NOTE — Telephone Encounter (Signed)
Patient states that Erin [name] took patient out of work until June. Patient is not due back to be seen by Dr. Johnsie Cancel until August. Wants to know if she needs to come back to see Erin [name] prior to that. / tg

## 2016-10-06 NOTE — Telephone Encounter (Signed)
This is [place] patient I would not have kept her out of work to begin with  Symptoms seem disproportionate to EF can see Juliann Pulse but would allow to go back to work

## 2016-10-07 ENCOUNTER — Ambulatory Visit: Payer: PRIVATE HEALTH INSURANCE | Admitting: Cardiovascular Disease

## 2016-10-07 ENCOUNTER — Ambulatory Visit: Payer: PRIVATE HEALTH INSURANCE | Admitting: Neurology

## 2016-10-07 NOTE — Telephone Encounter (Signed)
Will forward to K Lawrence NP 

## 2016-10-10 ENCOUNTER — Encounter (HOSPITAL_COMMUNITY): Payer: Self-pay | Admitting: Emergency Medicine

## 2016-10-10 ENCOUNTER — Encounter (HOSPITAL_COMMUNITY): Payer: PRIVATE HEALTH INSURANCE

## 2016-10-10 ENCOUNTER — Observation Stay (HOSPITAL_COMMUNITY)
Admission: EM | Admit: 2016-10-10 | Discharge: 2016-10-11 | Disposition: A | Discharge status: Home / Self Care | Payer: PRIVATE HEALTH INSURANCE | Attending: Emergency Medicine | Admitting: Emergency Medicine

## 2016-10-10 ENCOUNTER — Emergency Department (HOSPITAL_COMMUNITY): Payer: PRIVATE HEALTH INSURANCE

## 2016-10-10 DIAGNOSIS — I5042 Chronic combined systolic (congestive) and diastolic (congestive) heart failure: Secondary | ICD-10-CM | POA: Insufficient documentation

## 2016-10-10 DIAGNOSIS — E785 Hyperlipidemia, unspecified: Secondary | ICD-10-CM | POA: Diagnosis present

## 2016-10-10 DIAGNOSIS — R0789 Other chest pain: Principal | ICD-10-CM | POA: Diagnosis present

## 2016-10-10 DIAGNOSIS — R0602 Shortness of breath: Secondary | ICD-10-CM | POA: Insufficient documentation

## 2016-10-10 DIAGNOSIS — R079 Chest pain, unspecified: Secondary | ICD-10-CM | POA: Diagnosis present

## 2016-10-10 DIAGNOSIS — Z7982 Long term (current) use of aspirin: Secondary | ICD-10-CM | POA: Diagnosis not present

## 2016-10-10 DIAGNOSIS — I10 Essential (primary) hypertension: Secondary | ICD-10-CM | POA: Diagnosis present

## 2016-10-10 DIAGNOSIS — E782 Mixed hyperlipidemia: Secondary | ICD-10-CM | POA: Diagnosis present

## 2016-10-10 DIAGNOSIS — Z79899 Other long term (current) drug therapy: Secondary | ICD-10-CM | POA: Diagnosis not present

## 2016-10-10 DIAGNOSIS — I110 Hypertensive heart disease with heart failure: Secondary | ICD-10-CM | POA: Diagnosis not present

## 2016-10-10 DIAGNOSIS — Z87891 Personal history of nicotine dependence: Secondary | ICD-10-CM | POA: Insufficient documentation

## 2016-10-10 DIAGNOSIS — I2510 Atherosclerotic heart disease of native coronary artery without angina pectoris: Secondary | ICD-10-CM | POA: Diagnosis not present

## 2016-10-10 DIAGNOSIS — I252 Old myocardial infarction: Secondary | ICD-10-CM | POA: Diagnosis not present

## 2016-10-10 LAB — BASIC METABOLIC PANEL
Anion gap: 9 (ref 5–15)
BUN: 22 mg/dL — ABNORMAL HIGH (ref 6–20)
CO2: 27 mmol/L (ref 22–32)
Calcium: 10.5 mg/dL — ABNORMAL HIGH (ref 8.9–10.3)
Chloride: 106 mmol/L (ref 101–111)
Creatinine, Ser: 0.95 mg/dL (ref 0.44–1.00)
GFR calc Af Amer: >60 mL/min (ref >60)
GFR calc non Af Amer: >60 mL/min (ref >60)
Glucose, Bld: 110 mg/dL — ABNORMAL HIGH (ref 65–99)
Potassium: 4.1 mmol/L (ref 3.5–5.1)
Sodium: 142 mmol/L (ref 135–145)

## 2016-10-10 LAB — CBC
HCT: 35.8 % — ABNORMAL LOW (ref 36.0–46.0)
Hemoglobin: 12.2 g/dL (ref 12.0–15.0)
MCH: 28.6 pg (ref 26.0–34.0)
MCHC: 34.1 g/dL (ref 30.0–36.0)
MCV: 84 fL (ref 78.0–100.0)
Platelets: 426 10*3/uL — ABNORMAL HIGH (ref 150–400)
RBC: 4.26 MIL/uL (ref 3.87–5.11)
RDW: 15.3 % (ref 11.5–15.5)
WBC: 11.4 10*3/uL — ABNORMAL HIGH (ref 4.0–10.5)

## 2016-10-10 LAB — TROPONIN I: Troponin I: <0.03 ng/mL (ref <0.03)

## 2016-10-10 LAB — BRAIN NATRIURETIC PEPTIDE: B Natriuretic Peptide: 229 pg/mL — ABNORMAL HIGH (ref 0.0–100.0)

## 2016-10-10 MED ORDER — NITROGLYCERIN 2 % TD OINT: 1.0000 [in_us] | TOPICAL_OINTMENT | Freq: Four times a day (QID) | TRANSDERMAL | Status: DC

## 2016-10-10 MED ORDER — ASPIRIN 81 MG PO CHEW
324.0000 mg | CHEWABLE_TABLET | Freq: Once | ORAL | Status: AC
  Administered 2016-10-10: 324 mg via ORAL
  Filled 2016-10-10: qty 4

## 2016-10-10 MED ORDER — NITROGLYCERIN 0.4 MG SL SUBL
0.4000 mg | SUBLINGUAL_TABLET | SUBLINGUAL | Status: DC | PRN
  Administered 2016-10-10: 0.4 mg via SUBLINGUAL
  Filled 2016-10-10: qty 1

## 2016-10-10 NOTE — ED Notes (Addendum)
Patient transported to X-ray 

## 2016-10-10 NOTE — ED Triage Notes (Signed)
Pt c/o chest pain that radiates to left arm that started around 1800 with nausea, sob and dizziness.

## 2016-10-10 NOTE — ED Provider Notes (Signed)
Patient reports about 6 PM after helping her daughter move she started having central chest pressure that does not radiate. She had nausea without vomiting and does feel short of breath. She states the last time she had chest pain was about 2 months ago. She states she normally uses nitroglycerin and it helps. She took 2 today however she still having chest discomfort. She is followed by Dr. Johnsie Cancel, cardiologist. She has several cardiac stents.She states the discomfort today feels more like when she had congestive heart failure than when she had a MI. She states her pain is currently a "7 out of 10".  Patient is alert and cooperative, HEENT is without acute abnormality, her lungs are clear, cardiovascular S1-S2 normal without murmurs or gallops. There is no peripheral edema.  Medical screening examination/treatment/procedure(s) were conducted as a shared visit with non-physician practitioner(s) and myself.  I personally evaluated the patient during the encounter.   EKG Interpretation  Date/Time:  Monday October 10 2016 22:21:17 EDT Ventricular Rate:  110 PR Interval:  128 QRS Duration: 64 QT Interval:  334 QTC Calculation: 452 R Axis:   -10 Text Interpretation:  Sinus tachycardia Possible Left atrial enlargement Nonspecific ST abnormality Since last tracing rate faster 04 Sep 2016 Confirmed by Rolland Porter [phone]) on 10/10/2016 11:57:58 PM       Rolland Porter, MD, Barbette Or, MD 10/10/16 [phone]

## 2016-10-10 NOTE — ED Provider Notes (Signed)
[facility] DEPT Provider Note   CSN: [id number] Arrival date & time: 10/10/16  2215     History   Chief Complaint Chief Complaint  Patient presents with  . Chest Pain    HPI Erin [name] is a 56 y.o. female who presents to the ED w/cc of cp. She has a known history of coronary artery disease with previous and STEMI and 03/28/2016 and 3 stent placement. The patient is a smoker with history of hypertension. Patient states that she developed achy pain in her left shoulder about a day and a half ago. It did not improve with Tylenol. No aggravation of symptoms with movement of the shoulder. Today, she was helping her sister move and with lifting clothing and working when she developed onset of left-sided chest pain with significant pressure, shortness of breath, diaphoresis and nausea. She thought she might have anxiety and took over her entire anxiety medications which did not improve her pain. She states that she took 2 of her sublingual nitroglycerin and had improvement in her pain. However, it has worsened since she has completed emergency department. She denies any recent injuries, cough, fevers.  HPI  Past Medical History:  Diagnosis Date  . Anxiety state 03/25/2016  . Basal cell carcinoma of forehead   . CHF (congestive heart failure) ([place])   . Coronary artery disease    a. 03/11/16 PCI with DES-->Prox/Mid Cx, 03/14/16 PCI with DES x2-->RCA, EF 30-35%  . Essential hypertension   . GERD (gastroesophageal reflux disease)   . History of pneumonia   . Hyperlipidemia   . Ischemic cardiomyopathy   . Mitral regurgitation   . NSTEMI (non-ST elevated myocardial infarction) ([place]) 03/10/2016  . Squamous cell cancer of skin of nose   . Thrombocytosis ([place]) 03/26/2016  . Wears dentures   . Wears glasses     Patient Active Problem List   Diagnosis Date Noted  . Chronic combined systolic and diastolic CHF (congestive heart failure) ([place]) 06/20/2016  . Hypokalemia   . Acute CHF  (congestive heart failure) ([place]) 05/16/2016  . Acute on chronic systolic CHF (congestive heart failure) ([place]) 05/16/2016  . Acute respiratory failure with hypoxia ([place])   . Thrombocytosis ([place]) 03/26/2016  . Cardiomyopathy, ischemic 03/25/2016  . Acute on chronic combined systolic and diastolic congestive heart failure ([place]) 03/25/2016  . Anxiety state 03/25/2016  . Troponin level elevated 03/25/2016  . CAD (coronary artery disease) 03/25/2016  . Normocytic anemia 03/25/2016  . SOB (shortness of breath) 03/24/2016  . Lightheadedness 03/17/2016  . Hypotension 03/17/2016  . Tobacco abuse 03/12/2016  . NSTEMI (non-ST elevated myocardial infarction) ([place]) 03/11/2016  . Pain in the chest   . Essential hypertension 09/06/2015  . Hyperlipidemia 09/06/2015  . GERD (gastroesophageal reflux disease) 09/06/2015  . Chest pain 09/06/2015    Past Surgical History:  Procedure Laterality Date  . APPENDECTOMY    . CARDIAC CATHETERIZATION N/A 03/11/2016   Procedure: Left Heart Cath and Coronary Angiography;  Surgeon: Leonie Man, MD;  Location: [facility];  Service: Cardiovascular;  Laterality: N/A;  . CARDIAC CATHETERIZATION N/A 03/11/2016   Procedure: Coronary Stent Intervention;  Surgeon: Leonie Man, MD;  Location: [facility];  Service: Cardiovascular;  Laterality: N/A;  . CARDIAC CATHETERIZATION N/A 03/14/2016   Procedure: Coronary Stent Intervention;  Surgeon: Peter M Martinique, MD;  Location: [facility];  Service: Cardiovascular;  Laterality: N/A;  . CHOLECYSTECTOMY OPEN  1984  . CORONARY ANGIOPLASTY WITH STENT PLACEMENT  03/14/2016  . FINGER ARTHROPLASTY Left 05/14/2013   Procedure: LEFT THUMB CARPAL METACARPAL ARTHROPLASTY;  Surgeon: Tennis Must, MD;  Location: [facility];  Service: Orthopedics;  Laterality: Left;  . TUBAL LIGATION  1987  . VAGINAL HYSTERECTOMY  2009    OB History    Gravida Para Term Preterm AB Living   4 4 4     4    SAB TAB  Ectopic Multiple Live Births                   Home Medications    Prior to Admission medications   Medication Sig Start Date End Date Taking? Authorizing Provider  ALPRAZolam Duanne Moron) 1 MG tablet Take 0.5-1 mg by mouth 2 (two) times daily as needed for anxiety.   Yes [provider]  aspirin EC 81 MG tablet Take 81 mg by mouth daily.   Yes [provider]  carvedilol (COREG) 3.125 MG tablet Take 1 tablet (3.125 mg total) by mouth 2 (two) times daily with a meal. 08/05/16  Yes Hosie Poisson, MD  clopidogrel (PLAVIX) 75 MG tablet Take 1 tablet (75 mg total) by mouth daily. 06/02/16  Yes Lendon Colonel, NP  furosemide (LASIX) 20 MG tablet Take 2 tablets (40 mg total) by mouth daily. 07/08/16  Yes Eileen Stanford, PA-C  LORazepam (ATIVAN) 0.5 MG tablet Take 1 tablet (0.5 mg total) by mouth every 12 (twelve) hours as needed for anxiety. DO NOT DRIVE WHEN TAKING THIS MEDICATION. Patient taking differently: Take 0.5 mg by mouth at bedtime. DO NOT DRIVE WHEN TAKING THIS MEDICATION. 03/26/16  Yes Rexene Alberts, MD  losartan (COZAAR) 25 MG tablet Take 0.5 tablets (12.5 mg total) by mouth every evening. 08/22/16  Yes Lendon Colonel, NP  nitroGLYCERIN (NITROSTAT) 0.4 MG SL tablet DISSOLVE ONE TABLET UNDER THE TONGUE EVERY 5 MINUTES AS NEEDED FOR CHEST PAIN.  DO NOT EXCEED A TOTAL OF 3 DOSES IN 15 MINUTES 09/07/16  Yes Josue Hector, MD  potassium chloride SA (K-DUR,KLOR-CON) 20 MEQ tablet Take 1 tablet (20 mEq total) by mouth daily. 09/07/16  Yes Josue Hector, MD  ranolazine (RANEXA) 500 MG 12 hr tablet Take 1 tablet (500 mg total) by mouth 2 (two) times daily. 08/22/16  Yes Lendon Colonel, NP  acetaminophen (TYLENOL) 500 MG tablet Take 1 tablet (500 mg total) by mouth every 8 (eight) hours as needed for mild pain, moderate pain or headache. 03/15/16   Cheryln Manly, NP  atorvastatin (LIPITOR) 80 MG tablet Take 1 tablet (80 mg total) by mouth daily. 07/07/16 10/05/16   Eileen Stanford, PA-C  calcium carbonate (TUMS - DOSED IN MG ELEMENTAL CALCIUM) 500 MG chewable tablet Chew 1 tablet by mouth daily as needed for indigestion or heartburn.    [provider]  pantoprazole (PROTONIX) 20 MG tablet Take 20 mg by mouth 2 (two) times daily.    [provider]    Family History Family History  Problem Relation Age of Onset  . Stroke Mother   . Hypertension Mother   . Diabetes Mother   . Heart failure Other     Social History Social History  Substance Use Topics  . Smoking status: Former Smoker    Packs/day: 1.00    Years: 15.00    Types: Cigarettes  . Smokeless tobacco: Never Used     Comment: quit 2 weeks ago-03/24/16  . Alcohol use 3.6 oz/week    6 Cans of beer  per week     Comment: occ     Allergies   Patient has no known allergies.   Review of Systems Review of Systems  Ten systems reviewed and are negative for acute change, except as noted in the HPI.    Physical Exam Updated Vital Signs BP 128/84 (BP Location: Left Arm)   Pulse 99   Temp 98.8 F (37.1 C) (Oral)   Ht 5' (1.524 m)   Wt 49.9 kg (110 lb)   SpO2 100%   BMI 21.48 kg/m   Physical Exam  Constitutional: She is oriented to person, place, and time. She appears well-developed and well-nourished. No distress.  HENT:  Head: Normocephalic and atraumatic.  Eyes: Conjunctivae are normal. No scleral icterus.  Neck: Normal range of motion.  Cardiovascular: Normal rate, regular rhythm and normal heart sounds.  Exam reveals no gallop and no friction rub.   No murmur heard. Pulmonary/Chest: Effort normal and breath sounds normal. No respiratory distress.  Abdominal: Soft. Bowel sounds are normal. She exhibits no distension and no mass. There is no tenderness. There is no guarding.  Neurological: She is alert and oriented to person, place, and time.  Skin: Skin is warm and dry. She is not diaphoretic.  Psychiatric: Her behavior is normal.  Nursing note  and vitals reviewed.    ED Treatments / Results  Labs (all labs ordered are listed, but only abnormal results are displayed) Labs Reviewed  CBC - Abnormal; Notable for the following:       Result Value   WBC 11.4 (*)    HCT 35.8 (*)    Platelets 426 (*)    All other components within normal limits  BASIC METABOLIC PANEL  I-STAT TROPOININ, ED    EKG  EKG Interpretation None       Radiology No results found.  Procedures Procedures (including critical care time)  Medications Ordered in ED Medications - No data to display   Initial Impression / Assessment and Plan / ED Course  I have reviewed the triage vital signs and the nursing notes.  Pertinent labs & imaging results that were available during my care of the patient were reviewed by me and considered in my medical decision making (see chart for details).     Patient with high risk for acute coronary syndrome. I discussed the case with Dr. Radford Pax who feels patient is safe to stay here for chest pain, rule out and see the cardiologist the morning. She stable throughout visit. I spoken with Dr. Darrick Meigs who will admit the patient with tried regional hospitalist.  Final Clinical Impressions(s) / ED Diagnoses   Final diagnoses:  Chest pain, unspecified type    New Prescriptions New Prescriptions   No medications on file     Margarita Mail, PA-C 10/11/16 Marveen Reeks, MD 10/11/16 [phone]

## 2016-10-11 ENCOUNTER — Ambulatory Visit: Payer: PRIVATE HEALTH INSURANCE | Admitting: Cardiology

## 2016-10-11 DIAGNOSIS — I10 Essential (primary) hypertension: Secondary | ICD-10-CM

## 2016-10-11 DIAGNOSIS — E782 Mixed hyperlipidemia: Secondary | ICD-10-CM | POA: Diagnosis not present

## 2016-10-11 DIAGNOSIS — R079 Chest pain, unspecified: Secondary | ICD-10-CM | POA: Diagnosis not present

## 2016-10-11 LAB — CREATININE, SERUM
Creatinine, Ser: 0.74 mg/dL (ref 0.44–1.00)
GFR calc Af Amer: >60 mL/min (ref >60)
GFR calc non Af Amer: >60 mL/min (ref >60)

## 2016-10-11 LAB — CBC
HCT: 31.2 % — ABNORMAL LOW (ref 36.0–46.0)
Hemoglobin: 10.7 g/dL — ABNORMAL LOW (ref 12.0–15.0)
MCH: 28.8 pg (ref 26.0–34.0)
MCHC: 34.3 g/dL (ref 30.0–36.0)
MCV: 84.1 fL (ref 78.0–100.0)
Platelets: 418 10*3/uL — ABNORMAL HIGH (ref 150–400)
RBC: 3.71 MIL/uL — ABNORMAL LOW (ref 3.87–5.11)
RDW: 15.5 % (ref 11.5–15.5)
WBC: 8.6 10*3/uL (ref 4.0–10.5)

## 2016-10-11 LAB — TROPONIN I
Troponin I: <0.03 ng/mL (ref <0.03)
Troponin I: <0.03 ng/mL (ref <0.03)

## 2016-10-11 MED ORDER — LOSARTAN POTASSIUM 25 MG PO TABS: 12.5000 mg | ORAL_TABLET | Freq: Every evening | ORAL | Status: DC

## 2016-10-11 MED ORDER — CARVEDILOL 3.125 MG PO TABS
3.1250 mg | ORAL_TABLET | Freq: Two times a day (BID) | ORAL | Status: DC
  Administered 2016-10-11: 3.125 mg via ORAL
  Filled 2016-10-11: qty 1

## 2016-10-11 MED ORDER — POTASSIUM CHLORIDE CRYS ER 20 MEQ PO TBCR
20.0000 meq | EXTENDED_RELEASE_TABLET | Freq: Every day | ORAL | Status: DC
  Administered 2016-10-11: 20 meq via ORAL
  Filled 2016-10-11: qty 1

## 2016-10-11 MED ORDER — ACETAMINOPHEN 325 MG PO TABS: 650.0000 mg | ORAL_TABLET | Freq: Four times a day (QID) | ORAL | Status: DC | PRN

## 2016-10-11 MED ORDER — RANOLAZINE ER 500 MG PO TB12
500.0000 mg | ORAL_TABLET | Freq: Two times a day (BID) | ORAL | Status: DC
  Administered 2016-10-11: 500 mg via ORAL
  Filled 2016-10-11: qty 1

## 2016-10-11 MED ORDER — ONDANSETRON HCL 4 MG/2ML IJ SOLN: 4.0000 mg | Freq: Four times a day (QID) | INTRAMUSCULAR | Status: DC | PRN

## 2016-10-11 MED ORDER — ALPRAZOLAM 0.5 MG PO TABS: 0.5000 mg | ORAL_TABLET | Freq: Two times a day (BID) | ORAL | Status: DC | PRN

## 2016-10-11 MED ORDER — ATORVASTATIN CALCIUM 40 MG PO TABS: 80.0000 mg | ORAL_TABLET | Freq: Every day | ORAL | Status: DC

## 2016-10-11 MED ORDER — ASPIRIN EC 81 MG PO TBEC
81.0000 mg | DELAYED_RELEASE_TABLET | Freq: Every day | ORAL | Status: DC
  Administered 2016-10-11: 81 mg via ORAL
  Filled 2016-10-11: qty 1

## 2016-10-11 MED ORDER — CLOPIDOGREL BISULFATE 75 MG PO TABS
75.0000 mg | ORAL_TABLET | Freq: Every day | ORAL | Status: DC
  Administered 2016-10-11: 75 mg via ORAL
  Filled 2016-10-11: qty 1

## 2016-10-11 MED ORDER — ENOXAPARIN SODIUM 40 MG/0.4ML ~~LOC~~ SOLN
40.0000 mg | SUBCUTANEOUS | Status: DC
  Administered 2016-10-11: 40 mg via SUBCUTANEOUS
  Filled 2016-10-11: qty 0.4

## 2016-10-11 MED ORDER — FUROSEMIDE 40 MG PO TABS
40.0000 mg | ORAL_TABLET | Freq: Every day | ORAL | Status: DC
  Administered 2016-10-11: 40 mg via ORAL
  Filled 2016-10-11: qty 1

## 2016-10-11 NOTE — Telephone Encounter (Signed)
Routed to wrong person, Reuben Likes. Nicole Kindred, LPN.

## 2016-10-11 NOTE — Telephone Encounter (Signed)
Patient admitted last night for CP

## 2016-10-11 NOTE — Care Management Note (Signed)
Case Management Note  Patient Details  Name: Erin [name] MRN: [id number] Date of Birth: 01-19-1961  Subjective/Objective:                  Pt admitted with CP. Chart reviewed for CM needs. [place], lives with husband, ind with ADL's, has PCP, transportation and insurance with drug coverage.   Action/Plan: No CM needs anticipated. Pt discharging home with self care today.   Expected Discharge Date:  10/11/16               Expected Discharge Plan:  Home/Self Care  In-House Referral:  NA  Discharge planning Services  CM Consult  Post Acute Care Choice:  NA Choice offered to:  NA  Status of Service:  Completed, signed off  Sherald Barge, RN 10/11/2016, 11:43 AM

## 2016-10-11 NOTE — Discharge Summary (Signed)
Physician Discharge Summary  Erin [name] OXB:[id number] DOB: [birth year] DOA: 10/10/2016  PCP: Maurice Small, MD  Admit date: 10/10/2016 Discharge date: 10/11/2016  Time spent: 45 minutes  Recommendations for Outpatient Follow-up:  -Will be discharged home today. -Advised to follow up with cardiology in 1 weeks to determine if further testing is necessary.   Discharge Diagnoses:  Active Problems:   Essential hypertension   Hyperlipidemia   Chest pain   Pain in the chest   Discharge Condition: Stable and improved  Filed Weights   10/10/16 2224 10/11/16 0540  Weight: 49.9 kg (110 lb) 53.3 kg (117 lb 6.4 oz)    History of present illness:  As per Dr. Darrick [name] on 6/5: Erin [name]  is a 56 y.o. female, with history of CAD, NSTEMI, status post 2 stent placement, hypertension, tobacco abuse, combined CHF with EF 30-35% came to hospital with chest pain which felt like pressure. Patient also had shortness of breath which lasted for 30 minutes. Patient says that the chest pain improved after she received nitroglycerin in the ED. Patient says the pain started after she was helping her sister move with lifting clothing. She denies vomiting or diarrhea. No fever or dysuria. Complains of nausea. ED physician called and discussed with Dr. Radford Pax, who recommended that patient can stay at [facility] hospital with cardiology evaluation in a.m.  Hospital Course:   Chest Pain -Has ruled out for ACS via negative troponins and EKG. -Has had no recurrence of pain and is anxious for DC home today. -Given her h/o CAD, I have recommended close OP follow up with cardiology to see if further testing is required. -To continue ranexa.  Procedures:  None   Consultations:  None  Discharge Instructions  Discharge Instructions    Diet - low sodium heart healthy    Complete by:  As directed    Increase activity slowly    Complete by:  As directed      Allergies as of 10/11/2016   No Known Allergies     Medication List    STOP taking these medications   LORazepam 0.5 MG tablet Commonly known as:  ATIVAN     TAKE these medications   acetaminophen 500 MG tablet Commonly known as:  TYLENOL Take 1 tablet (500 mg total) by mouth every 8 (eight) hours as needed for mild pain, moderate pain or headache.   ALPRAZolam 1 MG tablet Commonly known as:  XANAX Take 0.5-1 mg by mouth 2 (two) times daily as needed for anxiety.   aspirin EC 81 MG tablet Take 81 mg by mouth daily.   calcium carbonate 500 MG chewable tablet Commonly known as:  TUMS - dosed in mg elemental calcium Chew 1 tablet by mouth daily as needed for indigestion or heartburn.   carvedilol 3.125 MG tablet Commonly known as:  COREG Take 1 tablet (3.125 mg total) by mouth 2 (two) times daily with a meal.   clopidogrel 75 MG tablet Commonly known as:  PLAVIX Take 1 tablet (75 mg total) by mouth daily.   furosemide 20 MG tablet Commonly known as:  LASIX Take 2 tablets (40 mg total) by mouth daily.   losartan 25 MG tablet Commonly known as:  COZAAR Take 0.5 tablets (12.5 mg total) by mouth every evening.   nitroGLYCERIN 0.4 MG SL tablet Commonly known as:  NITROSTAT DISSOLVE ONE TABLET UNDER THE TONGUE EVERY 5 MINUTES AS NEEDED FOR CHEST PAIN.  DO NOT EXCEED A TOTAL OF 3 DOSES  IN 15 MINUTES   potassium chloride SA 20 MEQ tablet Commonly known as:  K-DUR,KLOR-CON Take 1 tablet (20 mEq total) by mouth daily.   ranolazine 500 MG 12 hr tablet Commonly known as:  RANEXA Take 1 tablet (500 mg total) by mouth 2 (two) times daily.      No Known Allergies Follow-up Information    Maurice Small, MD. Schedule an appointment as soon as possible for a visit in 2 week(s).   Specialty:  Family Medicine Contact information: [place] [address] [phone]        Lendon Colonel, NP. Schedule an appointment as soon as possible for a visit in 1 week(s).   Specialties:  Nurse  Practitioner, Radiology, Cardiology Contact information: [address] [place] [address] [phone]            The results of significant diagnostics from this hospitalization (including imaging, microbiology, ancillary and laboratory) are listed below for reference.    Significant Diagnostic Studies: Dg Chest 2 View  Result Date: 10/10/2016 CLINICAL DATA:  Initial evaluation for acute mid chest pain, shortness of breath. EXAM: CHEST  2 VIEW COMPARISON:  Prior radiograph from 09/04/2016. FINDINGS: The cardiac and mediastinal silhouettes are stable in size and contour, and remain within normal limits. Coronary stent noted. The lungs are normally inflated. No airspace consolidation, pleural effusion, or pulmonary edema is identified. There is no pneumothorax. No acute osseous abnormality identified. IMPRESSION: No active cardiopulmonary disease. Electronically Signed   By: Jeannine Boga M.D.   On: 10/10/2016 23:49    Microbiology: No results found for this or any previous visit (from the past 240 hour(s)).   Labs: Basic Metabolic Panel:  Recent Labs Lab 10/10/16 2230 10/11/16 0558  NA 142  --   K 4.1  --   CL 106  --   CO2 27  --   GLUCOSE 110*  --   BUN 22*  --   CREATININE 0.95 0.74  CALCIUM 10.5*  --    Liver Function Tests: No results for input(s): AST, ALT, ALKPHOS, BILITOT, PROT, ALBUMIN in the last 168 hours. No results for input(s): LIPASE, AMYLASE in the last 168 hours. No results for input(s): AMMONIA in the last 168 hours. CBC:  Recent Labs Lab 10/10/16 2230 10/11/16 0558  WBC 11.4* 8.6  HGB 12.2 10.7*  HCT 35.8* 31.2*  MCV 84.0 84.1  PLT 426* 418*   Cardiac Enzymes:  Recent Labs Lab 10/10/16 2230 10/11/16 0558  TROPONINI <0.03 <0.03   BNP: BNP (last 3 results)  Recent Labs  08/04/16 0250 09/04/16 0522 10/10/16 2230  BNP 332.0* 143.0* 229.0*    ProBNP (last 3 results)  Recent Labs  07/07/16 0900  PROBNP 910*     CBG: No results for input(s): GLUCAP in the last 168 hours.     SignedLelon Frohlich  [facility] Pager: [phone] 10/11/2016, 11:34 AM

## 2016-10-11 NOTE — Progress Notes (Signed)
Patient is to be discharged home and in stable condition. IV and telemetry removed. Discharge instructions discussed with patient and patient verbalized understanding of discharge instructions and the need for follow-up appointments. Patient will be escorted out by staff via wheelchair.   Celestia Khat, RN

## 2016-10-11 NOTE — H&P (Signed)
TRH H&P    Patient Demographics:    Erin [name], is a 56 y.o. female  MRN: [id number]  DOB - May 20, 1960  Admit Date - 10/10/2016  Referring MD/NP/PA: Margarita Mail  Outpatient Primary MD for the patient is Maurice Small, MD  Patient coming from:  Home Chief Complaint  Patient presents with  . Chest Pain      HPI:    Erin [name]  is a 56 y.o. female, with history of CAD, NSTEMI, status post 2 stent placement, hypertension, tobacco abuse, combined CHF with EF 30-35% came to hospital with chest pain which felt like pressure. Patient also had shortness of breath which lasted for 30 minutes. Patient says that the chest pain improved after she received nitroglycerin in the ED. Patient says the pain started after she was helping her sister move with lifting clothing. She denies vomiting or diarrhea. No fever or dysuria. Complains of nausea. ED physician called and discussed with Dr. Radford Pax, who recommended that patient can stay at [facility] hospital with cardiology evaluation in a.m.   Review of systems:      A full 10 point Review of Systems was done, except as stated above, all other Review of Systems were negative.   With Past History of the following :    Past Medical History:  Diagnosis Date  . Anxiety state 03/25/2016  . Basal cell carcinoma of forehead   . CHF (congestive heart failure) ([place])   . Coronary artery disease    a. 03/11/16 PCI with DES-->Prox/Mid Cx, 03/14/16 PCI with DES x2-->RCA, EF 30-35%  . Essential hypertension   . GERD (gastroesophageal reflux disease)   . History of pneumonia   . Hyperlipidemia   . Ischemic cardiomyopathy   . Mitral regurgitation   . NSTEMI (non-ST elevated myocardial infarction) ([place]) 03/10/2016  . Squamous cell cancer of skin of nose   . Thrombocytosis ([place]) 03/26/2016  . Wears dentures   . Wears glasses       Past Surgical History:  Procedure  Laterality Date  . APPENDECTOMY    . CARDIAC CATHETERIZATION N/A 03/11/2016   Procedure: Left Heart Cath and Coronary Angiography;  Surgeon: Leonie Man, MD;  Location: [place] [facility];  Service: Cardiovascular;  Laterality: N/A;  . CARDIAC CATHETERIZATION N/A 03/11/2016   Procedure: Coronary Stent Intervention;  Surgeon: Leonie Man, MD;  Location: [place] CV LAB;  Service: Cardiovascular;  Laterality: N/A;  . CARDIAC CATHETERIZATION N/A 03/14/2016   Procedure: Coronary Stent Intervention;  Surgeon: Peter M Martinique, MD;  Location: [place] CV LAB;  Service: Cardiovascular;  Laterality: N/A;  . CHOLECYSTECTOMY OPEN  1984  . CORONARY ANGIOPLASTY WITH STENT PLACEMENT  03/14/2016  . FINGER ARTHROPLASTY Left 05/14/2013   Procedure: LEFT THUMB CARPAL METACARPAL ARTHROPLASTY;  Surgeon: Tennis Must, MD;  Location: [facility];  Service: Orthopedics;  Laterality: Left;  . TUBAL LIGATION  1987  . VAGINAL HYSTERECTOMY  2009      Social History:      Social History  Substance Use Topics  .  Smoking status: Former Smoker    Packs/day: 1.00    Years: 15.00    Types: Cigarettes  . Smokeless tobacco: Never Used     Comment: quit 2 weeks ago-03/24/16  . Alcohol use 3.6 oz/week    6 Cans of beer per week     Comment: occ       Family History :     Family History  Problem Relation Age of Onset  . Stroke Mother   . Hypertension Mother   . Diabetes Mother   . Heart failure Other       Home Medications:   Prior to Admission medications   Medication Sig Start Date End Date Taking? Authorizing Provider  acetaminophen (TYLENOL) 500 MG tablet Take 1 tablet (500 mg total) by mouth every 8 (eight) hours as needed for mild pain, moderate pain or headache. 03/15/16  Yes Cheryln Manly, NP  ALPRAZolam Duanne Moron) 1 MG tablet Take 0.5-1 mg by mouth 2 (two) times daily as needed for anxiety.   Yes [provider]  aspirin EC 81 MG tablet Take 81 mg by mouth  daily.   Yes [provider]  calcium carbonate (TUMS - DOSED IN MG ELEMENTAL CALCIUM) 500 MG chewable tablet Chew 1 tablet by mouth daily as needed for indigestion or heartburn.   Yes [provider]  carvedilol (COREG) 3.125 MG tablet Take 1 tablet (3.125 mg total) by mouth 2 (two) times daily with a meal. 08/05/16  Yes Hosie Poisson, MD  clopidogrel (PLAVIX) 75 MG tablet Take 1 tablet (75 mg total) by mouth daily. 06/02/16  Yes Lendon Colonel, NP  furosemide (LASIX) 20 MG tablet Take 2 tablets (40 mg total) by mouth daily. 07/08/16  Yes Eileen Stanford, PA-C  LORazepam (ATIVAN) 0.5 MG tablet Take 1 tablet (0.5 mg total) by mouth every 12 (twelve) hours as needed for anxiety. DO NOT DRIVE WHEN TAKING THIS MEDICATION. Patient taking differently: Take 0.5 mg by mouth at bedtime. DO NOT DRIVE WHEN TAKING THIS MEDICATION. 03/26/16  Yes Rexene Alberts, MD  losartan (COZAAR) 25 MG tablet Take 0.5 tablets (12.5 mg total) by mouth every evening. 08/22/16  Yes Lendon Colonel, NP  nitroGLYCERIN (NITROSTAT) 0.4 MG SL tablet DISSOLVE ONE TABLET UNDER THE TONGUE EVERY 5 MINUTES AS NEEDED FOR CHEST PAIN.  DO NOT EXCEED A TOTAL OF 3 DOSES IN 15 MINUTES 09/07/16  Yes Josue Hector, MD  potassium chloride SA (K-DUR,KLOR-CON) 20 MEQ tablet Take 1 tablet (20 mEq total) by mouth daily. 09/07/16  Yes Josue Hector, MD  ranolazine (RANEXA) 500 MG 12 hr tablet Take 1 tablet (500 mg total) by mouth 2 (two) times daily. 08/22/16  Yes Lendon Colonel, NP     Allergies:    No Known Allergies   Physical Exam:   Vitals  Blood pressure 103/69, pulse 76, temperature 98.8 F (37.1 C), temperature source Oral, resp. rate 19, height 5' (1.524 m), weight 49.9 kg (110 lb), SpO2 99 %.  1.  General: Appears in no acute distress  2. Psychiatric:  Intact judgement and  insight, awake alert, oriented x 3.  3. Neurologic: No focal neurological deficits, all cranial nerves intact.Strength 5/5 all  4 extremities, sensation intact all 4 extremities, plantars down going.  4. Eyes :  anicteric sclerae, moist conjunctivae with no lid lag. PERRLA.  5. ENMT:  Oropharynx clear with moist mucous membranes and good dentition  6. Neck:  supple, no cervical lymphadenopathy appriciated, No thyromegaly  7. Respiratory : Normal respiratory effort, good air movement bilaterally,clear to  auscultation bilaterally  8. Cardiovascular : RRR, no gallops, rubs or murmurs, no leg edema  9. Gastrointestinal:  Positive bowel sounds, abdomen soft, non-tender to palpation,no hepatosplenomegaly, no rigidity or guarding       10. Skin:  No cyanosis, normal texture and turgor, no rash, lesions or ulcers  11.Musculoskeletal:  Good muscle tone,  joints appear normal , no effusions,  normal range of motion    Data Review:    CBC  Recent Labs Lab 10/10/16 2230  WBC 11.4*  HGB 12.2  HCT 35.8*  PLT 426*  MCV 84.0  MCH 28.6  MCHC 34.1  RDW 15.3   ------------------------------------------------------------------------------------------------------------------  Chemistries   Recent Labs Lab 10/10/16 2230  NA 142  K 4.1  CL 106  CO2 27  GLUCOSE 110*  BUN 22*  CREATININE 0.95  CALCIUM 10.5*   ------------------------------------------------------------------------------------------------------------------  ------------------------------------------------------------------------------------------------------------------ GFR: Estimated Creatinine Clearance: 48.6 mL/min (by C-G formula based on SCr of 0.95 mg/dL). Liver Function Tests: No results for input(s): AST, ALT, ALKPHOS, BILITOT, PROT, ALBUMIN in the last 168 hours. No results for input(s): LIPASE, AMYLASE in the last 168 hours. No results for input(s): AMMONIA in the last 168 hours. Coagulation Profile: No results for input(s): INR, PROTIME in the last 168 hours. Cardiac Enzymes:  Recent Labs Lab 10/10/16 2230    TROPONINI <0.03   BNP (last 3 results)  Recent Labs  07/07/16 0900  PROBNP 910*   HbA1C: No results for input(s): HGBA1C in the last 72 hours. CBG: No results for input(s): GLUCAP in the last 168 hours. Lipid Profile: No results for input(s): CHOL, HDL, LDLCALC, TRIG, CHOLHDL, LDLDIRECT in the last 72 hours. Thyroid Function Tests: No results for input(s): TSH, T4TOTAL, FREET4, T3FREE, THYROIDAB in the last 72 hours. Anemia Panel: No results for input(s): VITAMINB12, FOLATE, FERRITIN, TIBC, IRON, RETICCTPCT in the last 72 hours.  --------------------------------------------------------------------------------------------------------------- Urine analysis:    Component Value Date/Time   COLORURINE YELLOW 09/04/2016 0500   APPEARANCEUR HAZY (A) 09/04/2016 0500   LABSPEC 1.023 09/04/2016 0500   PHURINE 5.0 09/04/2016 0500   GLUCOSEU NEGATIVE 09/04/2016 0500   HGBUR NEGATIVE 09/04/2016 0500   BILIRUBINUR NEGATIVE 09/04/2016 0500   KETONESUR NEGATIVE 09/04/2016 0500   PROTEINUR NEGATIVE 09/04/2016 0500   NITRITE NEGATIVE 09/04/2016 0500   LEUKOCYTESUR TRACE (A) 09/04/2016 0500      Imaging Results:    Dg Chest 2 View  Result Date: 10/10/2016 CLINICAL DATA:  Initial evaluation for acute mid chest pain, shortness of breath. EXAM: CHEST  2 VIEW COMPARISON:  Prior radiograph from 09/04/2016. FINDINGS: The cardiac and mediastinal silhouettes are stable in size and contour, and remain within normal limits. Coronary stent noted. The lungs are normally inflated. No airspace consolidation, pleural effusion, or pulmonary edema is identified. There is no pneumothorax. No acute osseous abnormality identified. IMPRESSION: No active cardiopulmonary disease. Electronically Signed   By: Jeannine Boga M.D.   On: 10/10/2016 23:49    My personal review of EKG: Rhythm NSR, Nonspecific ST abnormality    Assessment & Plan:    Active Problems:   Essential hypertension    Hyperlipidemia   Chest pain   Pain in the chest  1. Chest pain- place under observation, patient is chest pain-free at this time. Will monitor cardiac enzymes every 6 hours 3. 2. CAD, status post 2 stents- patient has history of CAD, continue aspirin and Plavix, Ranexa, atorvastatin. 3. Hypertension-blood pressure stable,  continue Coreg, losartan   DVT Prophylaxis-   Lovenox   AM Labs Ordered, also please review Full Orders  Family Communication: Admission, patients condition and plan of care including tests being ordered have been discussed with the patient and Her husband at bedside* who indicate understanding and agree with the plan and Code Status.  Code Status:  Full code  Admission status: Observation    Time spent in minutes : 60 minutes   Nataleigh Griffin S M.D on 10/11/2016 at 4:17 AM  Between 7am to 7pm - Pager - [phone]. After 7pm go to [email or web address] - password [email or web address]  [facility] - Office  [phone]

## 2016-10-11 NOTE — Telephone Encounter (Signed)
Agree with Dr. Johnsie Cancel. Should be able to go to work. She is trying for disability, and is often fatigued at work. Will need to see PCP for follow up as well.

## 2016-10-17 ENCOUNTER — Encounter (HOSPITAL_COMMUNITY): Payer: PRIVATE HEALTH INSURANCE

## 2016-10-18 ENCOUNTER — Ambulatory Visit (INDEPENDENT_AMBULATORY_CARE_PROVIDER_SITE_OTHER): Payer: PRIVATE HEALTH INSURANCE | Admitting: Adult Health

## 2016-10-18 ENCOUNTER — Encounter: Payer: Self-pay | Admitting: Adult Health

## 2016-10-18 VITALS — BP 108/64 | HR 72 | Ht 63.0 in | Wt 117.0 lb

## 2016-10-18 DIAGNOSIS — I43 Cardiomyopathy in diseases classified elsewhere: Secondary | ICD-10-CM | POA: Diagnosis not present

## 2016-10-18 DIAGNOSIS — I2510 Atherosclerotic heart disease of native coronary artery without angina pectoris: Secondary | ICD-10-CM | POA: Diagnosis not present

## 2016-10-18 DIAGNOSIS — I5042 Chronic combined systolic (congestive) and diastolic (congestive) heart failure: Secondary | ICD-10-CM | POA: Diagnosis not present

## 2016-10-18 MED ORDER — METOPROLOL TARTRATE 25 MG PO TABS: 25.0000 mg | ORAL_TABLET | Freq: Two times a day (BID) | ORAL | 3 refills | Status: DC

## 2016-10-18 NOTE — Patient Instructions (Signed)
Your physician recommends that you schedule a follow-up appointment in: 1 Month with Dr. Dwana Curd   Your physician has recommended you make the following change in your medication:   Stop Taking Coreg   Start Taking Lopressor 25 mg Two Times Daily   If you need a refill on your cardiac medications before your next appointment, please call your pharmacy.  Thank you for choosing [facility]!

## 2016-10-18 NOTE — Progress Notes (Addendum)
Cardiology Office Note   Date:  10/18/2016   ID:  Erin, [name] March 20, 1961, MRN [id number]  PCP:  Maurice Small, MD  Cardiologist:  Genoveva Ill  Chief Complaint  Patient presents with  . Cardiomyopathy  . Congestive Heart Failure  . Coronary Artery Disease      History of Present Illness: Erin [name] is a 56 y.o. female who presents for posthospitalization follow-up with a history of ischemic cardiomyopathy, prior non-ST elevation MI, coronary artery disease with 2 drug-eluting stents, chronic anxiety, hypertension, hyperlipidemia, and GERD. Patient has frequent admissions to the hospital for recurrent chest pain and decompensated CHF.   Most recent echocardiogram revealed an EF of 30-35%. On last office visit she was profoundly fatigue stating that she was unable to work and was trying to apply for disability. She was given a work release for a month, while she was gathering information to apply for disability. She called the questioning and an additional extension concerning returning to work as she did not feel she was able to do so. She is due to return to work in August 2018 unless disability has started.  The patient subsequently presented to the hospital with complaints recurrent chest pain and shortness of breath. She was treated in the emergency room with nitroglycerin. She states the symptoms started after she was helping her sister lift clothing. She was ruled out for ACS. No medication changes were made.    Since leaving the hospital, the patient is felt much better. She has had significant changes in her life which have reduced her stress considerably and allowed her to take better care of herself. Her daughter and 7 grandchildren have moved out of her home. This allows her more rest, more time to herself, and less stress concerning taking care of all of them. Also she and her husband have been separated for the last 3 months which allows her also to have less  stress as there was a lot of arguing.  The patient states that she continues to have dyspnea going up and down stairs into her basement when she does her laundry. She is having her washer and dryer moved to her first floor so that she does not have to climb stairs at all. She continues to have fatigue but this is improving. She's had no further chest pain. There are times in the afternoon when she feels her heart racing.  She is also following with primary care who was changed Ativan to Xanax which helps her anxiety level considerably. She does state however, that she is not taking it very often now that her life has become much more peaceful.  Past Medical History:  Diagnosis Date  . Anxiety state 03/25/2016  . Basal cell carcinoma of forehead   . CHF (congestive heart failure) ([place])   . Coronary artery disease    a. 03/11/16 PCI with DES-->Prox/Mid Cx, 03/14/16 PCI with DES x2-->RCA, EF 30-35%  . Essential hypertension   . GERD (gastroesophageal reflux disease)   . History of pneumonia   . Hyperlipidemia   . Ischemic cardiomyopathy   . Mitral regurgitation   . NSTEMI (non-ST elevated myocardial infarction) ([place]) 03/10/2016  . Squamous cell cancer of skin of nose   . Thrombocytosis ([place]) 03/26/2016  . Wears dentures   . Wears glasses     Past Surgical History:  Procedure Laterality Date  . APPENDECTOMY    . CARDIAC CATHETERIZATION N/A 03/11/2016   Procedure: Left Heart Cath and Coronary  Angiography;  Surgeon: Leonie Man, MD;  Location: [facility];  Service: Cardiovascular;  Laterality: N/A;  . CARDIAC CATHETERIZATION N/A 03/11/2016   Procedure: Coronary Stent Intervention;  Surgeon: Leonie Man, MD;  Location: [place] [facility];  Service: Cardiovascular;  Laterality: N/A;  . CARDIAC CATHETERIZATION N/A 03/14/2016   Procedure: Coronary Stent Intervention;  Surgeon: Peter M Martinique, MD;  Location: [place];  Service: Cardiovascular;  Laterality: N/A;  .  CHOLECYSTECTOMY OPEN  1984  . CORONARY ANGIOPLASTY WITH STENT PLACEMENT  03/14/2016  . FINGER ARTHROPLASTY Left 05/14/2013   Procedure: LEFT THUMB CARPAL METACARPAL ARTHROPLASTY;  Surgeon: Tennis Must, MD;  Location: [facility];  Service: Orthopedics;  Laterality: Left;  . TUBAL LIGATION  1987  . VAGINAL HYSTERECTOMY  2009     Current Outpatient Prescriptions  Medication Sig Dispense Refill  . acetaminophen (TYLENOL) 500 MG tablet Take 1 tablet (500 mg total) by mouth every 8 (eight) hours as needed for mild pain, moderate pain or headache. 30 tablet 0  . ALPRAZolam (XANAX) 1 MG tablet Take 0.5-1 mg by mouth 2 (two) times daily as needed for anxiety.    [name] Kitchen aspirin EC 81 MG tablet Take 81 mg by mouth daily.    . calcium carbonate (TUMS - DOSED IN MG ELEMENTAL CALCIUM) 500 MG chewable tablet Chew 1 tablet by mouth daily as needed for indigestion or heartburn.    . clopidogrel (PLAVIX) 75 MG tablet Take 1 tablet (75 mg total) by mouth daily. 90 tablet 3  . furosemide (LASIX) 20 MG tablet Take 2 tablets (40 mg total) by mouth daily. 60 tablet 6  . losartan (COZAAR) 25 MG tablet Take 0.5 tablets (12.5 mg total) by mouth every evening. 135 tablet 3  . nitroGLYCERIN (NITROSTAT) 0.4 MG SL tablet DISSOLVE ONE TABLET UNDER THE TONGUE EVERY 5 MINUTES AS NEEDED FOR CHEST PAIN.  DO NOT EXCEED A TOTAL OF 3 DOSES IN 15 MINUTES 25 tablet 3  . potassium chloride SA (K-DUR,KLOR-CON) 20 MEQ tablet Take 1 tablet (20 mEq total) by mouth daily. 30 tablet 6  . ranolazine (RANEXA) 500 MG 12 hr tablet Take 1 tablet (500 mg total) by mouth 2 (two) times daily. 60 tablet 6  . metoprolol tartrate (LOPRESSOR) 25 MG tablet Take 1 tablet (25 mg total) by mouth 2 (two) times daily. 180 tablet 3   No current facility-administered medications for this visit.     Allergies:   Patient has no known allergies.    Social History:  The patient  reports that she has quit smoking. Her smoking use included  Cigarettes. She has a 15.00 pack-year smoking history. She has never used smokeless tobacco. She reports that she drinks about 3.6 oz of alcohol per week . She reports that she uses drugs, including Marijuana and MDMA (Ecstacy).   Family History:  The patient's family history includes Diabetes in her mother; Heart failure in her other; Hypertension in her mother; Stroke in her mother.    ROS: All other systems are reviewed and negative. Unless otherwise mentioned in H&P    PHYSICAL EXAM: VS:  BP 108/64   Pulse 72   Ht 5\' 3"  (1.6 m)   Wt 117 lb (53.1 kg)   SpO2 96%   BMI 20.73 kg/m  , BMI Body mass index is 20.73 kg/m. GEN: Well nourished, well developed, in no acute distress  HEENT: normal  Neck: no JVD, carotid bruits, or masses Cardiac: RRR;  tachycardic, no murmurs, rubs, or gallops,no edema  Respiratory:  clear to auscultation bilaterally, normal work of breathing GI: soft, nontender, nondistended, + BS MS: no deformity or atrophy  Skin: warm and dry, no rash Neuro:  Strength and sensation are intact Psych: euthymic mood, full affect   Recent Labs: 07/07/2016: NT-Pro BNP 910 08/04/2016: ALT 16; Magnesium 1.9; TSH 0.944 10/10/2016: B Natriuretic Peptide 229.0; BUN 22; Potassium 4.1; Sodium 142 10/11/2016: Creatinine, Ser 0.74; Hemoglobin 10.7; Platelets 418    Lipid Panel    Component Value Date/Time   CHOL 107 05/17/2016 0536   TRIG 168 (H) 05/17/2016 0536   HDL 41 05/17/2016 0536   CHOLHDL 2.6 05/17/2016 0536   VLDL 34 05/17/2016 0536   LDLCALC 32 05/17/2016 0536      Wt Readings from Last 3 Encounters:  10/18/16 117 lb (53.1 kg)  10/11/16 117 lb 6.4 oz (53.3 kg)  09/04/16 112 lb (50.8 kg)    Other studies Reviewed:  Echocardiogram 05/16/2016 Left ventricle: The cavity size was normal. Wall thickness was   increased in a pattern of mild LVH. Systolic function was   moderately reduced. The estimated ejection fraction was in the   range of 35% to 40%. There is  akinesis of the   basal-midinferolateral, inferior, and inferoseptal myocardium.   Doppler parameters are consistent with abnormal left ventricular   relaxation (grade 1 diastolic dysfunction). - Aortic valve: Mildly calcified annulus. Trileaflet. - Mitral valve: There was mild regurgitation. - Tricuspid valve: There was trivial regurgitation. - Pulmonary arteries: PA peak pressure: 22 mm Hg (S). - Pericardium, extracardiac: A small to moderate pericardial   effusion was identified circumferential to the heart. No obvious   right ventricular compromise. Mitral inflow with respiration does   not suggest definitive tamponade physiology.  Cardiac Cath 03/14/2016 Conclusion     Mid Cx lesion, 0 %stenosed.  A drug eluting .  A STENT PROMUS PREM MR 2.25X16 drug eluting stent was successfully placed.  A STENT PROMUS PREM MR 2.25X8 drug-eluting stent was successfully placed.  Dist RCA lesion, 60 %stenosed.  Post intervention, there is a 0% residual stenosis.  Post Atrio lesion, 0 %stenosed.  Post intervention, there is a 0% residual stenosis.  A stent was successfully placed.   1. Successful Stenting of the distal RCA/PDA with DES x 2.     ASSESSMENT AND PLAN:  1.  Chronic systolic heart failure: EF of 35% most recent echocardiogram. She continues to have dyspnea on exertion, especially when climbing stairs from her basement. She will no longer be going down to her basement to do laundry as she is having her washer and dryer moved to her first floor which will make things easier for her. She is maintaining a low-sodium diet and taking her Lasix as directed. Review of labs during hospitalization revealed creatinine 0.95 and potassium of 4.1  . I discussed this patient with  Dr. Bronson Ing. Due to frequent admissions for chest pain and decompensated heart failure, we will discontinue carvedilol and began metoprolol tartrate, 25 mg twice a day. [place] does not cover  metoprolol succinate, and therefore to save her money we will try to twice a day dosing. She will continue losartan. We'll see her again in one month. Will have echocardiogram completed prior to office visit for reassessment of LV function.  2. Coronary artery disease: Patient has drug-eluting stent to the  RCA/PDA, along with drug-eluting stent to circumflex. She was recently seen in the emergency room with chest  pain. She now states it was related to stress. She was helping her daughter move and was under a lot of pressure with grandchildren and children. She feels as if she over did it. Since she has now been living alone she has had no further complaints of chest pain. I will therefore not increase Ranexa. She will continue aspirin and Plavix as directed.  3. Ischemic cardiopathy: As stated above will repeat echocardiogram. Continue beta blocker and ARB. It echocardiogram reveals continuing reduced EF, medication titration versus repeat catheterization can be considered. However she is not symptomatic at this time. Seen one month recheck patient's response to medication and blood pressure control. She is awaiting disability papers and has not yet returned to work.   Current medicines are reviewed at length with the patient today.    Labs/ tests ordered today include: Echocardiogram.  Phill Myron. West Pugh, ANP, AACC   10/18/2016 4:31 PM    [facility] [address] [place], [place] [address] Phone: [phone]; Fax: [phone]

## 2016-10-20 ENCOUNTER — Ambulatory Visit (HOSPITAL_COMMUNITY)
Admission: RE | Admit: 2016-10-20 | Discharge: 2016-10-20 | Disposition: A | Discharge status: Home / Self Care | Payer: PRIVATE HEALTH INSURANCE | Source: Ambulatory Visit | Attending: Adult Health | Admitting: Adult Health

## 2016-10-20 DIAGNOSIS — Z87891 Personal history of nicotine dependence: Secondary | ICD-10-CM | POA: Diagnosis not present

## 2016-10-20 DIAGNOSIS — I214 Non-ST elevation (NSTEMI) myocardial infarction: Secondary | ICD-10-CM | POA: Insufficient documentation

## 2016-10-20 DIAGNOSIS — I5042 Chronic combined systolic (congestive) and diastolic (congestive) heart failure: Secondary | ICD-10-CM | POA: Diagnosis not present

## 2016-10-20 DIAGNOSIS — I2510 Atherosclerotic heart disease of native coronary artery without angina pectoris: Secondary | ICD-10-CM | POA: Diagnosis not present

## 2016-10-20 DIAGNOSIS — I110 Hypertensive heart disease with heart failure: Secondary | ICD-10-CM | POA: Diagnosis not present

## 2016-10-20 DIAGNOSIS — I081 Rheumatic disorders of both mitral and tricuspid valves: Secondary | ICD-10-CM | POA: Insufficient documentation

## 2016-10-20 DIAGNOSIS — K219 Gastro-esophageal reflux disease without esophagitis: Secondary | ICD-10-CM | POA: Diagnosis not present

## 2016-10-20 DIAGNOSIS — E785 Hyperlipidemia, unspecified: Secondary | ICD-10-CM | POA: Diagnosis not present

## 2016-10-20 LAB — ECHOCARDIOGRAM COMPLETE
E decel time: 165 msec
E/e' ratio: 13.86
FS: 12 % — AB (ref 28–44)
IVS/LV PW RATIO, ED: 1.14
LA ID, A-P, ES: 33 mm
LA diam end sys: 33 mm
LA diam index: 2.15 cm/m2
LA vol A4C: 44 ml
LA vol index: 30.4 mL/m2
LA vol: 46.7 mL
LV E/e' medial: 13.86
LV E/e'average: 13.86
LV PW d: 9.66 mm — AB (ref 0.6–1.1)
LV dias vol index: 52 mL/m2
LV dias vol: 80 mL (ref 46–106)
LV e' LATERAL: 6.09 cm/s
LV sys vol index: 33 mL/m2
LV sys vol: 50 mL — AB
LVOT SV: 57 mL
LVOT VTI: 18.1 cm
LVOT area: 3.14 cm2
LVOT diameter: 20 mm
LVOT peak grad rest: 3 mmHg
LVOT peak vel: 87.1 cm/s
Lateral S' vel: 9.25 cm/s
MV Dec: 165
MV Peak grad: 3 mmHg
MV pk A vel: 99.4 m/s
MV pk E vel: 84.4 m/s
PISA EROA: 0.02 cm2
RV sys press: 26 mmHg
Reg peak vel: 240 cm/s
Simpson's disk: 38
Stroke v: 30 ml
TAPSE: 16.5 mm
TDI e' lateral: 6.09
TDI e' medial: 5.33
TR max vel: 240 cm/s
VTI: 207 cm

## 2016-10-20 NOTE — Progress Notes (Signed)
*  PRELIMINARY RESULTS* Echocardiogram 2D Echocardiogram has been performed.  Erin [name] 10/20/2016, 2:51 PM

## 2016-10-24 ENCOUNTER — Encounter (HOSPITAL_COMMUNITY): Payer: PRIVATE HEALTH INSURANCE

## 2016-10-25 ENCOUNTER — Other Ambulatory Visit: Payer: Self-pay | Admitting: Cardiology

## 2016-10-29 ENCOUNTER — Other Ambulatory Visit: Payer: Self-pay | Admitting: Cardiology

## 2016-10-31 ENCOUNTER — Encounter (HOSPITAL_COMMUNITY): Payer: PRIVATE HEALTH INSURANCE

## 2016-11-07 ENCOUNTER — Encounter (HOSPITAL_COMMUNITY): Payer: PRIVATE HEALTH INSURANCE

## 2016-11-14 ENCOUNTER — Encounter (HOSPITAL_COMMUNITY): Payer: PRIVATE HEALTH INSURANCE

## 2016-11-18 ENCOUNTER — Ambulatory Visit: Payer: PRIVATE HEALTH INSURANCE | Admitting: Neurology

## 2016-11-19 ENCOUNTER — Emergency Department (HOSPITAL_COMMUNITY): Payer: PRIVATE HEALTH INSURANCE

## 2016-11-19 ENCOUNTER — Observation Stay (HOSPITAL_COMMUNITY)
Admission: EM | Admit: 2016-11-19 | Discharge: 2016-11-21 | Disposition: A | Discharge status: Home / Self Care | Payer: PRIVATE HEALTH INSURANCE | Attending: Internal Medicine | Admitting: Internal Medicine

## 2016-11-19 ENCOUNTER — Encounter (HOSPITAL_COMMUNITY): Payer: Self-pay | Admitting: Emergency Medicine

## 2016-11-19 DIAGNOSIS — I429 Cardiomyopathy, unspecified: Secondary | ICD-10-CM | POA: Insufficient documentation

## 2016-11-19 DIAGNOSIS — E785 Hyperlipidemia, unspecified: Secondary | ICD-10-CM | POA: Diagnosis present

## 2016-11-19 DIAGNOSIS — Z7982 Long term (current) use of aspirin: Secondary | ICD-10-CM | POA: Diagnosis not present

## 2016-11-19 DIAGNOSIS — Z955 Presence of coronary angioplasty implant and graft: Secondary | ICD-10-CM | POA: Insufficient documentation

## 2016-11-19 DIAGNOSIS — I255 Ischemic cardiomyopathy: Secondary | ICD-10-CM

## 2016-11-19 DIAGNOSIS — F17210 Nicotine dependence, cigarettes, uncomplicated: Secondary | ICD-10-CM | POA: Insufficient documentation

## 2016-11-19 DIAGNOSIS — I25119 Atherosclerotic heart disease of native coronary artery with unspecified angina pectoris: Secondary | ICD-10-CM | POA: Diagnosis not present

## 2016-11-19 DIAGNOSIS — E782 Mixed hyperlipidemia: Secondary | ICD-10-CM | POA: Diagnosis present

## 2016-11-19 DIAGNOSIS — I5042 Chronic combined systolic (congestive) and diastolic (congestive) heart failure: Secondary | ICD-10-CM | POA: Diagnosis not present

## 2016-11-19 DIAGNOSIS — R072 Precordial pain: Secondary | ICD-10-CM | POA: Diagnosis not present

## 2016-11-19 DIAGNOSIS — I10 Essential (primary) hypertension: Secondary | ICD-10-CM | POA: Diagnosis not present

## 2016-11-19 DIAGNOSIS — I110 Hypertensive heart disease with heart failure: Secondary | ICD-10-CM | POA: Insufficient documentation

## 2016-11-19 DIAGNOSIS — K219 Gastro-esophageal reflux disease without esophagitis: Secondary | ICD-10-CM

## 2016-11-19 DIAGNOSIS — Z7901 Long term (current) use of anticoagulants: Secondary | ICD-10-CM | POA: Diagnosis not present

## 2016-11-19 DIAGNOSIS — I2510 Atherosclerotic heart disease of native coronary artery without angina pectoris: Secondary | ICD-10-CM | POA: Diagnosis not present

## 2016-11-19 DIAGNOSIS — Z720 Tobacco use: Secondary | ICD-10-CM | POA: Diagnosis not present

## 2016-11-19 DIAGNOSIS — R079 Chest pain, unspecified: Secondary | ICD-10-CM | POA: Diagnosis not present

## 2016-11-19 DIAGNOSIS — Z79899 Other long term (current) drug therapy: Secondary | ICD-10-CM | POA: Insufficient documentation

## 2016-11-19 LAB — COMPREHENSIVE METABOLIC PANEL
ALT: 19 U/L (ref 14–54)
AST: 25 U/L (ref 15–41)
Albumin: 4.3 g/dL (ref 3.5–5.0)
Alkaline Phosphatase: 70 U/L (ref 38–126)
Anion gap: 10 (ref 5–15)
BUN: 12 mg/dL (ref 6–20)
CO2: 26 mmol/L (ref 22–32)
Calcium: 10 mg/dL (ref 8.9–10.3)
Chloride: 103 mmol/L (ref 101–111)
Creatinine, Ser: 0.84 mg/dL (ref 0.44–1.00)
GFR calc Af Amer: >60 mL/min (ref >60)
GFR calc non Af Amer: >60 mL/min (ref >60)
Glucose, Bld: 121 mg/dL — ABNORMAL HIGH (ref 65–99)
Potassium: 3.7 mmol/L (ref 3.5–5.1)
Sodium: 139 mmol/L (ref 135–145)
Total Bilirubin: 0.9 mg/dL (ref 0.3–1.2)
Total Protein: 7.6 g/dL (ref 6.5–8.1)

## 2016-11-19 LAB — CBC
HCT: 37.3 % (ref 36.0–46.0)
Hemoglobin: 12.5 g/dL (ref 12.0–15.0)
MCH: 28.9 pg (ref 26.0–34.0)
MCHC: 33.5 g/dL (ref 30.0–36.0)
MCV: 86.1 fL (ref 78.0–100.0)
Platelets: 432 10*3/uL — ABNORMAL HIGH (ref 150–400)
RBC: 4.33 MIL/uL (ref 3.87–5.11)
RDW: 14.9 % (ref 11.5–15.5)
WBC: 11 10*3/uL — ABNORMAL HIGH (ref 4.0–10.5)

## 2016-11-19 LAB — PROTIME-INR
INR: 0.91
Prothrombin Time: 12.3 seconds (ref 11.4–15.2)

## 2016-11-19 LAB — TROPONIN I
Troponin I: <0.03 ng/mL (ref <0.03)
Troponin I: <0.03 ng/mL (ref <0.03)
Troponin I: <0.03 ng/mL (ref <0.03)

## 2016-11-19 MED ORDER — ENOXAPARIN SODIUM 40 MG/0.4ML ~~LOC~~ SOLN
40.0000 mg | SUBCUTANEOUS | Status: DC
  Administered 2016-11-19 – 2016-11-20 (×2): 40 mg via SUBCUTANEOUS
  Filled 2016-11-19 (×2): qty 0.4

## 2016-11-19 MED ORDER — NITROGLYCERIN 0.4 MG SL SUBL
0.4000 mg | SUBLINGUAL_TABLET | SUBLINGUAL | Status: DC | PRN
  Administered 2016-11-19: 0.4 mg via SUBLINGUAL
  Filled 2016-11-19: qty 1

## 2016-11-19 MED ORDER — ACETAMINOPHEN 325 MG PO TABS
650.0000 mg | ORAL_TABLET | ORAL | Status: DC | PRN
Start: 2016-11-19 — End: 2016-11-21

## 2016-11-19 MED ORDER — MORPHINE SULFATE (PF) 2 MG/ML IV SOLN: 2.0000 mg | INTRAVENOUS | Status: DC | PRN

## 2016-11-19 MED ORDER — FUROSEMIDE 40 MG PO TABS
40.0000 mg | ORAL_TABLET | Freq: Every day | ORAL | Status: DC
  Administered 2016-11-19 – 2016-11-21 (×3): 40 mg via ORAL
  Filled 2016-11-19 (×3): qty 1

## 2016-11-19 MED ORDER — METOPROLOL TARTRATE 25 MG PO TABS
25.0000 mg | ORAL_TABLET | Freq: Two times a day (BID) | ORAL | Status: DC
  Administered 2016-11-21: 25 mg via ORAL
  Filled 2016-11-19 (×4): qty 1

## 2016-11-19 MED ORDER — LORAZEPAM 1 MG PO TABS
1.0000 mg | ORAL_TABLET | Freq: Once | ORAL | Status: AC
  Administered 2016-11-19: 1 mg via ORAL
  Filled 2016-11-19: qty 1

## 2016-11-19 MED ORDER — RANOLAZINE ER 500 MG PO TB12
500.0000 mg | ORAL_TABLET | Freq: Two times a day (BID) | ORAL | Status: DC
  Administered 2016-11-19 – 2016-11-21 (×4): 500 mg via ORAL
  Filled 2016-11-19 (×4): qty 1

## 2016-11-19 MED ORDER — ATORVASTATIN CALCIUM 40 MG PO TABS
80.0000 mg | ORAL_TABLET | Freq: Every day | ORAL | Status: DC
  Administered 2016-11-19 – 2016-11-20 (×2): 80 mg via ORAL
  Filled 2016-11-19 (×3): qty 2

## 2016-11-19 MED ORDER — ALPRAZOLAM 0.5 MG PO TABS
0.5000 mg | ORAL_TABLET | Freq: Two times a day (BID) | ORAL | Status: DC | PRN
  Administered 2016-11-19: 0.5 mg via ORAL
  Administered 2016-11-20: 1 mg via ORAL
  Filled 2016-11-19: qty 2
  Filled 2016-11-19: qty 1

## 2016-11-19 MED ORDER — ONDANSETRON HCL 4 MG/2ML IJ SOLN: 4.0000 mg | Freq: Four times a day (QID) | INTRAMUSCULAR | Status: DC | PRN

## 2016-11-19 MED ORDER — LOSARTAN POTASSIUM 25 MG PO TABS
12.5000 mg | ORAL_TABLET | Freq: Every evening | ORAL | Status: DC
  Administered 2016-11-19 – 2016-11-20 (×2): 12.5 mg via ORAL

## 2016-11-19 MED ORDER — CLOPIDOGREL BISULFATE 75 MG PO TABS
75.0000 mg | ORAL_TABLET | Freq: Every day | ORAL | Status: DC
  Administered 2016-11-20 – 2016-11-21 (×2): 75 mg via ORAL
  Filled 2016-11-19 (×2): qty 1

## 2016-11-19 MED ORDER — ASPIRIN 81 MG PO CHEW
324.0000 mg | CHEWABLE_TABLET | Freq: Once | ORAL | Status: AC
  Administered 2016-11-19: 324 mg via ORAL
  Filled 2016-11-19: qty 4

## 2016-11-19 MED ORDER — ASPIRIN EC 81 MG PO TBEC
81.0000 mg | DELAYED_RELEASE_TABLET | Freq: Every day | ORAL | Status: DC
  Administered 2016-11-20 – 2016-11-21 (×2): 81 mg via ORAL
  Filled 2016-11-19 (×2): qty 1

## 2016-11-19 MED ORDER — GI COCKTAIL ~~LOC~~: 30.0000 mL | Freq: Four times a day (QID) | ORAL | Status: DC | PRN

## 2016-11-19 MED ORDER — POTASSIUM CHLORIDE CRYS ER 20 MEQ PO TBCR
20.0000 meq | EXTENDED_RELEASE_TABLET | Freq: Every day | ORAL | Status: DC
  Administered 2016-11-19 – 2016-11-21 (×3): 20 meq via ORAL
  Filled 2016-11-19 (×3): qty 1

## 2016-11-19 NOTE — ED Triage Notes (Signed)
Pt reports pain in center of chest with no radiation and nausea since last night.

## 2016-11-19 NOTE — ED Notes (Addendum)
Pt stable and ready for transport to [facility].  Report given to Hermine Messick, RN.

## 2016-11-19 NOTE — ED Provider Notes (Signed)
[facility] DEPT Provider Note   CSN: [id number] Arrival date & time: 11/19/16  0747     History   Chief Complaint Chief Complaint  Patient presents with  . Chest Pain    HPI [name] is a 56 y.o. female.  HPI  The patient is a 56 year old female, she has a known history of congestive heart failure, myocardial infarction status post 3 stents in November 2017, presents to the hospital today with a complaint of chest discomfort. She reports that last night she started feeling lightheaded and dizzy followed by some chest pressure and heaviness which has been present since last night. There is no radiation of the symptoms, she does have associated shortness of breath and nausea but denies any diaphoresis or swelling of the legs. She has been taking her medication daily including Plavix and a baby aspirin. The symptoms are persistent, they seem to ease off a little bit last night but were worse this morning. Specifically the patient denies a history of any exertional symptoms but states that she never exerts herself either, she does not exercise, she is very docile. She still smokes cigarettes.  Past Medical History:  Diagnosis Date  . Anxiety state 03/25/2016  . Basal cell carcinoma of forehead   . CHF (congestive heart failure) ([place])   . Coronary artery disease    a. 03/11/16 PCI with DES-->Prox/Mid Cx, 03/14/16 PCI with DES x2-->RCA, EF 30-35%  . Essential hypertension   . GERD (gastroesophageal reflux disease)   . History of pneumonia   . Hyperlipidemia   . Ischemic cardiomyopathy   . Mitral regurgitation   . NSTEMI (non-ST elevated myocardial infarction) ([place]) 03/10/2016  . Squamous cell cancer of skin of nose   . Thrombocytosis ([place]) 03/26/2016  . Wears dentures   . Wears glasses     Patient Active Problem List   Diagnosis Date Noted  . Chronic combined systolic and diastolic CHF (congestive heart failure) ([place]) 06/20/2016  . Hypokalemia   . Acute CHF  (congestive heart failure) ([place]) 05/16/2016  . Acute on chronic systolic CHF (congestive heart failure) ([place]) 05/16/2016  . Acute respiratory failure with hypoxia ([place])   . Thrombocytosis ([place]) 03/26/2016  . Cardiomyopathy, ischemic 03/25/2016  . Acute on chronic combined systolic and diastolic congestive heart failure ([place]) 03/25/2016  . Anxiety state 03/25/2016  . Troponin level elevated 03/25/2016  . CAD (coronary artery disease) 03/25/2016  . Normocytic anemia 03/25/2016  . SOB (shortness of breath) 03/24/2016  . Lightheadedness 03/17/2016  . Hypotension 03/17/2016  . Tobacco abuse 03/12/2016  . NSTEMI (non-ST elevated myocardial infarction) ([place]) 03/11/2016  . Pain in the chest   . Essential hypertension 09/06/2015  . Hyperlipidemia 09/06/2015  . GERD (gastroesophageal reflux disease) 09/06/2015  . Chest pain 09/06/2015    Past Surgical History:  Procedure Laterality Date  . APPENDECTOMY    . CARDIAC CATHETERIZATION N/A 03/11/2016   Procedure: Left Heart Cath and Coronary Angiography;  Surgeon: Leonie Man, MD;  Location: [facility];  Service: Cardiovascular;  Laterality: N/A;  . CARDIAC CATHETERIZATION N/A 03/11/2016   Procedure: Coronary Stent Intervention;  Surgeon: Leonie Man, MD;  Location: [facility];  Service: Cardiovascular;  Laterality: N/A;  . CARDIAC CATHETERIZATION N/A 03/14/2016   Procedure: Coronary Stent Intervention;  Surgeon: Peter M Martinique, MD;  Location: [facility];  Service: Cardiovascular;  Laterality: N/A;  . CHOLECYSTECTOMY OPEN  1984  . CORONARY ANGIOPLASTY WITH STENT PLACEMENT  03/14/2016  .  FINGER ARTHROPLASTY Left 05/14/2013   Procedure: LEFT THUMB CARPAL METACARPAL ARTHROPLASTY;  Surgeon: Tennis Must, MD;  Location: [facility];  Service: Orthopedics;  Laterality: Left;  . TUBAL LIGATION  1987  . VAGINAL HYSTERECTOMY  2009    OB History    Gravida Para Term Preterm AB Living   4 4 4     4    SAB TAB  Ectopic Multiple Live Births                   Home Medications    Prior to Admission medications   Medication Sig Start Date End Date Taking? Authorizing Provider  acetaminophen (TYLENOL) 500 MG tablet Take 1 tablet (500 mg total) by mouth every 8 (eight) hours as needed for mild pain, moderate pain or headache. 03/15/16   Cheryln Manly, NP  ALPRAZolam Duanne Moron) 1 MG tablet Take 0.5-1 mg by mouth 2 (two) times daily as needed for anxiety.    [provider]  aspirin EC 81 MG tablet Take 81 mg by mouth daily.    [provider]  atorvastatin (LIPITOR) 80 MG tablet TAKE ONE TABLET BY MOUTH ONCE DAILY SIX IN THE EVENING 10/31/16   Josue Hector, MD  calcium carbonate (TUMS - DOSED IN MG ELEMENTAL CALCIUM) 500 MG chewable tablet Chew 1 tablet by mouth daily as needed for indigestion or heartburn.    [provider]  clopidogrel (PLAVIX) 75 MG tablet Take 1 tablet (75 mg total) by mouth daily. 06/02/16   Lendon Colonel, NP  furosemide (LASIX) 20 MG tablet Take 2 tablets (40 mg total) by mouth daily. 07/08/16   Eileen Stanford, PA-C  losartan (COZAAR) 25 MG tablet Take 0.5 tablets (12.5 mg total) by mouth every evening. 08/22/16   Lendon Colonel, NP  metoprolol tartrate (LOPRESSOR) 25 MG tablet Take 1 tablet (25 mg total) by mouth 2 (two) times daily. 10/18/16 01/16/17  Lendon Colonel, NP  nitroGLYCERIN (NITROSTAT) 0.4 MG SL tablet DISSOLVE ONE TABLET UNDER THE TONGUE EVERY 5 MINUTES AS NEEDED FOR CHEST PAIN.  DO NOT EXCEED A TOTAL OF 3 DOSES IN 15 MINUTES 09/07/16   Josue Hector, MD  potassium chloride SA (K-DUR,KLOR-CON) 20 MEQ tablet Take 1 tablet (20 mEq total) by mouth daily. 09/07/16   Josue Hector, MD  ranolazine (RANEXA) 500 MG 12 hr tablet Take 1 tablet (500 mg total) by mouth 2 (two) times daily. 08/22/16   Lendon Colonel, NP    Family History Family History  Problem Relation Age of Onset  . Stroke Mother   . Hypertension Mother   .  Diabetes Mother   . Heart failure Other     Social History Social History  Substance Use Topics  . Smoking status: Current Every Day Smoker    Packs/day: 1.00    Years: 15.00    Types: Cigarettes  . Smokeless tobacco: Never Used  . Alcohol use 3.6 oz/week    6 Cans of beer per week     Comment: occ     Allergies   Patient has no known allergies.   Review of Systems Review of Systems  All other systems reviewed and are negative.    Physical Exam Updated Vital Signs BP 139/85   Pulse 74   Temp 98.1 F (36.7 C) (Oral)   Resp (!) 23   Ht 5' (1.524 m)   Wt 52.6 kg (116 lb)   SpO2 99%  BMI 22.65 kg/m   Physical Exam  Constitutional: She appears well-developed and well-nourished. No distress.  HENT:  Head: Normocephalic and atraumatic.  Mouth/Throat: Oropharynx is clear and moist. No oropharyngeal exudate.  Eyes: Pupils are equal, round, and reactive to light. Conjunctivae and EOM are normal. Right eye exhibits no discharge. Left eye exhibits no discharge. No scleral icterus.  Neck: Normal range of motion. Neck supple. No JVD present. No thyromegaly present.  Cardiovascular: Normal rate, regular rhythm, normal heart sounds and intact distal pulses.  Exam reveals no gallop and no friction rub.   No murmur heard. Pulmonary/Chest: Effort normal and breath sounds normal. No respiratory distress. She has no wheezes. She has no rales.  Abdominal: Soft. Bowel sounds are normal. She exhibits no distension and no mass. There is no tenderness.  Musculoskeletal: Normal range of motion. She exhibits no edema or tenderness.  Lymphadenopathy:    She has no cervical adenopathy.  Neurological: She is alert. Coordination normal.  Skin: Skin is warm and dry. No rash noted. No erythema.  Psychiatric: She has a normal mood and affect. Her behavior is normal.  Nursing note and vitals reviewed.   ED Treatments / Results  Labs (all labs ordered are listed, but only abnormal results  are displayed) Labs Reviewed  CBC - Abnormal; Notable for the following:       Result Value   WBC 11.0 (*)    Platelets 432 (*)    All other components within normal limits  COMPREHENSIVE METABOLIC PANEL - Abnormal; Notable for the following:    Glucose, Bld 121 (*)    All other components within normal limits  TROPONIN I  PROTIME-INR    EKG  EKG Interpretation  Date/Time:  Saturday November 19 2016 07:54:08 EDT Ventricular Rate:  87 PR Interval:    QRS Duration: 96 QT Interval:  356 QTC Calculation: 429 R Axis:   18 Text Interpretation:  Sinus rhythm Probable left atrial enlargement new  T wave inversion in V6 - otherwise unchanged Confirmed by Noemi Chapel [phone]) on 11/19/2016 8:06:58 AM       EKG Interpretation  Date/Time:  Saturday November 19 2016 08:58:29 EDT Ventricular Rate:  81 PR Interval:    QRS Duration: 94 QT Interval:  391 QTC Calculation: 326 R Axis:   -2 Text Interpretation:  Sinus rhythm since last tracing no significant change ongoing T wave abnormlaities Confirmed by Noemi Chapel [phone]) on 11/19/2016 9:15:35 AM        Radiology Dg Chest 2 View  Result Date: 11/19/2016 CLINICAL DATA:  Chest pain and shortness breath since last night. EXAM: CHEST  2 VIEW COMPARISON:  10/10/2016. FINDINGS: Normal sized heart. Clear lungs with normal vascularity. Coronary artery stents. Mild thoracic spine and bilateral shoulder degenerative changes. Cholecystectomy clip. IMPRESSION: No acute abnormality. Electronically Signed   By: Claudie Revering M.D.   On: 11/19/2016 08:25    Procedures Procedures (including critical care time)  Medications Ordered in ED Medications  nitroGLYCERIN (NITROSTAT) SL tablet 0.4 mg (0.4 mg Sublingual Given 11/19/16 8099)  aspirin chewable tablet 324 mg (324 mg Oral Given 11/19/16 0812)  LORazepam (ATIVAN) tablet 1 mg (1 mg Oral Given 11/19/16 0906)     Initial Impression / Assessment and Plan / ED Course  I have reviewed the triage vital  signs and the nursing notes.  Pertinent labs & imaging results that were available during my care of the patient were reviewed by me and considered in my medical decision making (see  chart for details).    The patient's EKG does have some new findings, there is no ST elevation or depression. Her symptoms are concerning as well which will necessitate nitroglycerin, aspirin and likely admission to the hospital though the patient feels like she may be having a panic attack her symptoms of been persistent since last night. She had very little sleep, she may need heparin, will discuss with admitting physicians after workup complete. She appears hemodynamically stable at this time.  Dr. Recardo Evangelist has seen ECG's - recommends cardiac r/o. Dr. Roderic Palau to admit  Final Clinical Impressions(s) / ED Diagnoses   Final diagnoses:  Chest pain, precordial    New Prescriptions New Prescriptions   No medications on file     Noemi Chapel, MD 11/19/16 [phone]

## 2016-11-19 NOTE — H&P (Signed)
History and Physical    Erin [name] HEN:[id number] DOB: [birth year] DOA: 11/19/2016  PCP: Maurice Small, MD  Patient coming from: home  I have personally briefly reviewed patient's old medical records in [place]  Chief Complaint: chest pain  HPI: Erin [name] is a 56 y.o. female with medical history significant of coronary artery disease, chronic combined CHF, ischemic cardiomyopathy, hypertension, GERD. Patient reports she was in her usual state of health last night when she developed onset of substernal chest discomfort. She describes as a pain that was radiating to the left side. This woke her up from sleep. It was consistent until arrival to the emergency room after which as been intermittent. She does not relate this to any exertion. She does not notice any worsening after eating. She feels it is associated with shortness of breath, diaphoresis, some nausea. She also feels lightheaded. She's not had any cough, fever, vomiting, diarrhea. She was admitted to the hospital for chest pain and 10/2016. When compared to that episode, she feels that her current episode is more painful.  ED Course: In the emergency room, vitals were noted to be stable. She received nitroglycerin and aspirin. EKG showed T-wave inversions in V6 which were not apparent last EKG . troponin was found to be negative. Dr. Sabra Heck discussed the case with cardiologist on call at [facility] who felt patient could stay at [facility] to be ruled out for MI.  Review of Systems: As per HPI otherwise 10 point review of systems negative.    Past Medical History:  Diagnosis Date  . Anxiety state 03/25/2016  . Basal cell carcinoma of forehead   . CHF (congestive heart failure) ([place])   . Coronary artery disease    a. 03/11/16 PCI with DES-->Prox/Mid Cx, 03/14/16 PCI with DES x2-->RCA, EF 30-35%  . Essential hypertension   . GERD (gastroesophageal reflux disease)   . History of pneumonia   . Hyperlipidemia     . Ischemic cardiomyopathy   . Mitral regurgitation   . NSTEMI (non-ST elevated myocardial infarction) ([place]) 03/10/2016  . Squamous cell cancer of skin of nose   . Thrombocytosis ([place]) 03/26/2016  . Wears dentures   . Wears glasses     Past Surgical History:  Procedure Laterality Date  . APPENDECTOMY    . CARDIAC CATHETERIZATION N/A 03/11/2016   Procedure: Left Heart Cath and Coronary Angiography;  Surgeon: Leonie Man, MD;  Location: [place] [facility];  Service: Cardiovascular;  Laterality: N/A;  . CARDIAC CATHETERIZATION N/A 03/11/2016   Procedure: Coronary Stent Intervention;  Surgeon: Leonie Man, MD;  Location: [place] [facility];  Service: Cardiovascular;  Laterality: N/A;  . CARDIAC CATHETERIZATION N/A 03/14/2016   Procedure: Coronary Stent Intervention;  Surgeon: Peter M Martinique, MD;  Location: [place] [facility];  Service: Cardiovascular;  Laterality: N/A;  . CHOLECYSTECTOMY OPEN  1984  . CORONARY ANGIOPLASTY WITH STENT PLACEMENT  03/14/2016  . FINGER ARTHROPLASTY Left 05/14/2013   Procedure: LEFT THUMB CARPAL METACARPAL ARTHROPLASTY;  Surgeon: Tennis Must, MD;  Location: [place];  Service: Orthopedics;  Laterality: Left;  . TUBAL LIGATION  1987  . VAGINAL HYSTERECTOMY  2009     reports that she has been smoking Cigarettes.  She has a 15.00 pack-year smoking history. She has never used smokeless tobacco. She reports that she drinks about 3.6 oz of alcohol per week . She reports that she uses drugs, including Marijuana and MDMA (Ecstacy).  No  Known Allergies  Family History  Problem Relation Age of Onset  . Stroke Mother   . Hypertension Mother   . Diabetes Mother   . Heart failure Other    Prior to Admission medications   Medication Sig Start Date End Date Taking? Authorizing Provider  acetaminophen (TYLENOL) 500 MG tablet Take 1 tablet (500 mg total) by mouth every 8 (eight) hours as needed for mild pain, moderate pain or headache. 03/15/16  Yes  Cheryln Manly, NP  ALPRAZolam Duanne Moron) 1 MG tablet Take 0.5-1 mg by mouth 2 (two) times daily as needed for anxiety.   Yes [provider]  aspirin EC 81 MG tablet Take 81 mg by mouth daily.   Yes [provider]  atorvastatin (LIPITOR) 80 MG tablet TAKE ONE TABLET BY MOUTH ONCE DAILY SIX IN THE EVENING 10/31/16  Yes Josue Hector, MD  calcium carbonate (TUMS - DOSED IN MG ELEMENTAL CALCIUM) 500 MG chewable tablet Chew 1 tablet by mouth daily as needed for indigestion or heartburn.   Yes [provider]  clopidogrel (PLAVIX) 75 MG tablet Take 1 tablet (75 mg total) by mouth daily. 06/02/16  Yes Lendon Colonel, NP  furosemide (LASIX) 20 MG tablet Take 2 tablets (40 mg total) by mouth daily. 07/08/16  Yes Eileen Stanford, PA-C  losartan (COZAAR) 25 MG tablet Take 0.5 tablets (12.5 mg total) by mouth every evening. 08/22/16  Yes Lendon Colonel, NP  metoprolol tartrate (LOPRESSOR) 25 MG tablet Take 1 tablet (25 mg total) by mouth 2 (two) times daily. 10/18/16 01/16/17 Yes Lendon Colonel, NP  nitroGLYCERIN (NITROSTAT) 0.4 MG SL tablet DISSOLVE ONE TABLET UNDER THE TONGUE EVERY 5 MINUTES AS NEEDED FOR CHEST PAIN.  DO NOT EXCEED A TOTAL OF 3 DOSES IN 15 MINUTES 09/07/16  Yes Josue Hector, MD  potassium chloride SA (K-DUR,KLOR-CON) 20 MEQ tablet Take 1 tablet (20 mEq total) by mouth daily. 09/07/16  Yes Josue Hector, MD  ranolazine (RANEXA) 500 MG 12 hr tablet Take 1 tablet (500 mg total) by mouth 2 (two) times daily. 08/22/16  Yes Lendon Colonel, NP    Physical Exam: Vitals:   11/19/16 0900 11/19/16 0930 11/19/16 1030 11/19/16 1130  BP: (!) 143/95 139/85 99/73 98/66   Pulse: 77 74 73 83  Resp: 18 (!) 23 (!) 21 20  Temp:    98.4 F (36.9 C)  TempSrc:    Oral  SpO2: 100% 99% 98% 100%  Weight:    53.5 kg (117 lb 14.4 oz)  Height:        Constitutional: NAD, calm, comfortable Vitals:   11/19/16 0900 11/19/16 0930 11/19/16 1030 11/19/16 1130  BP:  (!) 143/95 139/85 99/73 98/66   Pulse: 77 74 73 83  Resp: 18 (!) 23 (!) 21 20  Temp:    98.4 F (36.9 C)  TempSrc:    Oral  SpO2: 100% 99% 98% 100%  Weight:    53.5 kg (117 lb 14.4 oz)  Height:       Eyes: PERRL, lids and conjunctivae normal ENMT: Mucous membranes are moist. Posterior pharynx clear of any exudate or lesions.Normal dentition.  Neck: normal, supple, no masses, no thyromegaly Respiratory: clear to auscultation bilaterally, no wheezing, no crackles. Normal respiratory effort. No accessory muscle use.  Cardiovascular: Regular rate and rhythm, no murmurs / rubs / gallops. No extremity edema. 2+ pedal pulses. No carotid bruits.  Abdomen: no tenderness, no masses palpated. No hepatosplenomegaly. Bowel sounds positive.  Musculoskeletal: no clubbing / cyanosis. No joint deformity upper and lower extremities. Good ROM, no contractures. Normal muscle tone.  Skin: no rashes, lesions, ulcers. No induration Neurologic: CN 2-12 grossly intact. Sensation intact, DTR normal. Strength 5/5 in all 4.  Psychiatric: Normal judgment and insight. Alert and oriented x 3. Normal mood.   Labs on Admission: I have personally reviewed following labs and imaging studies  CBC:  Recent Labs Lab 11/19/16 0759  WBC 11.0*  HGB 12.5  HCT 37.3  MCV 86.1  PLT 323*   Basic Metabolic Panel:  Recent Labs Lab 11/19/16 0803  NA 139  K 3.7  CL 103  CO2 26  GLUCOSE 121*  BUN 12  CREATININE 0.84  CALCIUM 10.0   GFR: Estimated Creatinine Clearance: 55 mL/min (by C-G formula based on SCr of 0.84 mg/dL). Liver Function Tests:  Recent Labs Lab 11/19/16 0803  AST 25  ALT 19  ALKPHOS 70  BILITOT 0.9  PROT 7.6  ALBUMIN 4.3   No results for input(s): LIPASE, AMYLASE in the last 168 hours. No results for input(s): AMMONIA in the last 168 hours. Coagulation Profile:  Recent Labs Lab 11/19/16 0803  INR 0.91   Cardiac Enzymes:  Recent Labs Lab 11/19/16 0759  TROPONINI <0.03   BNP  (last 3 results)  Recent Labs  07/07/16 0900  PROBNP 910*   HbA1C: No results for input(s): HGBA1C in the last 72 hours. CBG: No results for input(s): GLUCAP in the last 168 hours. Lipid Profile: No results for input(s): CHOL, HDL, LDLCALC, TRIG, CHOLHDL, LDLDIRECT in the last 72 hours. Thyroid Function Tests: No results for input(s): TSH, T4TOTAL, FREET4, T3FREE, THYROIDAB in the last 72 hours. Anemia Panel: No results for input(s): VITAMINB12, FOLATE, FERRITIN, TIBC, IRON, RETICCTPCT in the last 72 hours. Urine analysis:    Component Value Date/Time   COLORURINE YELLOW 09/04/2016 0500   APPEARANCEUR HAZY (A) 09/04/2016 0500   LABSPEC 1.023 09/04/2016 0500   PHURINE 5.0 09/04/2016 0500   GLUCOSEU NEGATIVE 09/04/2016 0500   HGBUR NEGATIVE 09/04/2016 0500   BILIRUBINUR NEGATIVE 09/04/2016 0500   KETONESUR NEGATIVE 09/04/2016 0500   PROTEINUR NEGATIVE 09/04/2016 0500   NITRITE NEGATIVE 09/04/2016 0500   LEUKOCYTESUR TRACE (A) 09/04/2016 0500    Radiological Exams on Admission: Dg Chest 2 View  Result Date: 11/19/2016 CLINICAL DATA:  Chest pain and shortness breath since last night. EXAM: CHEST  2 VIEW COMPARISON:  10/10/2016. FINDINGS: Normal sized heart. Clear lungs with normal vascularity. Coronary artery stents. Mild thoracic spine and bilateral shoulder degenerative changes. Cholecystectomy clip. IMPRESSION: No acute abnormality. Electronically Signed   By: Claudie Revering M.D.   On: 11/19/2016 08:25    EKG: Independently reviewed. New t wave inversions in v6  Assessment/Plan Active Problems:   Essential hypertension   Hyperlipidemia   GERD (gastroesophageal reflux disease)   Chest pain   Tobacco abuse   Cardiomyopathy, ischemic   CAD (coronary artery disease)   Chronic combined systolic and diastolic CHF (congestive heart failure) ([place])   1. Chest pain. With underlying coronary disease, there is concern for angina. Some of her symptoms are atypical. Continue to  cycle cardiac markers. Repeat EKG in a.m. Continue on aspirin and Plavix. 2. Chronic combined systolic and diastolic heart failure. Ejection fraction of 35-40%. Appears compensated. Continue on Lasix. 3. Tobacco use. Counseled on the importance of tobacco cessation 4. Hyperlipidemia. Continue statin 5. GERD. Provide GI cocktail as necessary. 6. Coronary artery disease. Continue on Ranexa. His nitroglycerin as  necessary. Continue on aspirin and Plavix. Last stents were placed in 03/2016. She had a stress test that did not show any reversible ischemia and 07/2016.  DVT prophylaxis: lovenox Code Status: full code Family Communication: discussed with family at the bedside Disposition Plan: discharge home once improved Consults called:  Admission status: observation, tele   Charlesia Canaday MD [facility] Pager [phone]  If 7PM-7AM, please contact night-coverage [email or web address] Password [email or web address]  11/19/2016, 4:00 PM

## 2016-11-20 DIAGNOSIS — R079 Chest pain, unspecified: Secondary | ICD-10-CM | POA: Diagnosis not present

## 2016-11-20 DIAGNOSIS — I25119 Atherosclerotic heart disease of native coronary artery with unspecified angina pectoris: Secondary | ICD-10-CM | POA: Diagnosis not present

## 2016-11-20 DIAGNOSIS — I255 Ischemic cardiomyopathy: Secondary | ICD-10-CM | POA: Diagnosis not present

## 2016-11-20 DIAGNOSIS — I5042 Chronic combined systolic (congestive) and diastolic (congestive) heart failure: Secondary | ICD-10-CM | POA: Diagnosis not present

## 2016-11-20 NOTE — Progress Notes (Signed)
PROGRESS NOTE    Erin [name]  VCB:[id number] DOB: [birth year] DOA: 11/19/2016 PCP: Maurice Small, MD    Brief Narrative:  56 year old female with a history of coronary artery disease and stents placed in 03/2016, ischemic cardiomyopathy with ejection fraction of 35-40%, admitted to the hospital with typical/atypical chest pain. She was ruled out for ACS with negative cardiac markers. She did have some transient changes on her EKG. She continues to have chest discomfort on exertion that improves with rest. Will request cardiology input in a.m .   Assessment & Plan:   Active Problems:   Essential hypertension   Hyperlipidemia   GERD (gastroesophageal reflux disease)   Chest pain   Tobacco abuse   Cardiomyopathy, ischemic   CAD (coronary artery disease)   Chronic combined systolic and diastolic CHF (congestive heart failure) ([place])   1. Chest pain. With underlying coronary disease, there is concern for angina. She does have typical and atypical symptoms. Cardiac enzymes have thus far been negative. EKG repeated this morning showed inverted T waves in V6 have reverted. She is on aspirin and Plavix. Her typical symptoms are worrisome, especially in light of her underlying coronary disease. The patient with family hospital until tomorrow when she can be evaluated by cardiology. We'll keep her nothing by mouth after midnight in case any intervention is planned for tomorrow. 2. Chronic combined CHF. Ejection fraction of 35-40%. Appears compensated. Continue on Lasix. 3. Coronary artery disease. Continue on Ranexa. Continue on aspirin and Plavix. Last stents were placed in 03/2016. Stress test in 07/2016 did not show any reversible ischemia. 4. GERD. Provide GI cocktail as necessary 5. Hyperlipidemia. Continue statin 6. Ongoing tobacco use. Counseled on the importance of tobacco cessation.   DVT prophylaxis: Lovenox Code Status: Full code  Family Communication: Discussed with  patient Disposition Plan: Discharge home once improved   Consultants:     Procedures:     Antimicrobials:      Subjective: Still has intermittent chest pain, with associated shortness of breath, better with rest.  Objective: Vitals:   11/19/16 2112 11/19/16 2254 11/20/16 0557 11/20/16 1500  BP: (!) 97/50 111/67 (!) 96/53 95/64  Pulse: 78 73 75 80  Resp: 20  20 20   Temp: 98.5 F (36.9 C)  97.7 F (36.5 C) 98.8 F (37.1 C)  TempSrc: Oral  Oral Oral  SpO2: 98%  98% 99%  Weight:      Height:        Intake/Output Summary (Last 24 hours) at 11/20/16 1555 Last data filed at 11/20/16 0900  Gross per 24 hour  Intake              240 ml  Output                0 ml  Net              240 ml   Filed Weights   11/19/16 0752 11/19/16 1130  Weight: 52.6 kg (116 lb) 53.5 kg (117 lb 14.4 oz)    Examination:  General exam: Appears calm and comfortable  Respiratory system: Clear to auscultation. Respiratory effort normal. Cardiovascular system: S1 & S2 heard, RRR. No JVD, murmurs, rubs, gallops or clicks. No pedal edema. Gastrointestinal system: Abdomen is nondistended, soft and nontender. No organomegaly or masses felt. Normal bowel sounds heard. Central nervous system: Alert and oriented. No focal neurological deficits. Extremities: Symmetric 5 x 5 power. Skin: No rashes, lesions or ulcers Psychiatry: Judgement and insight appear normal.  Mood & affect appropriate.     Data Reviewed: I have personally reviewed following labs and imaging studies  CBC:  Recent Labs Lab 11/19/16 0759  WBC 11.0*  HGB 12.5  HCT 37.3  MCV 86.1  PLT 456*   Basic Metabolic Panel:  Recent Labs Lab 11/19/16 0803  NA 139  K 3.7  CL 103  CO2 26  GLUCOSE 121*  BUN 12  CREATININE 0.84  CALCIUM 10.0   GFR: Estimated Creatinine Clearance: 55 mL/min (by C-G formula based on SCr of 0.84 mg/dL). Liver Function Tests:  Recent Labs Lab 11/19/16 0803  AST 25  ALT 19  ALKPHOS 70   BILITOT 0.9  PROT 7.6  ALBUMIN 4.3   No results for input(s): LIPASE, AMYLASE in the last 168 hours. No results for input(s): AMMONIA in the last 168 hours. Coagulation Profile:  Recent Labs Lab 11/19/16 0803  INR 0.91   Cardiac Enzymes:  Recent Labs Lab 11/19/16 0759 11/19/16 1622 11/19/16 2232  TROPONINI <0.03 <0.03 <0.03   BNP (last 3 results)  Recent Labs  07/07/16 0900  PROBNP 910*   HbA1C: No results for input(s): HGBA1C in the last 72 hours. CBG: No results for input(s): GLUCAP in the last 168 hours. Lipid Profile: No results for input(s): CHOL, HDL, LDLCALC, TRIG, CHOLHDL, LDLDIRECT in the last 72 hours. Thyroid Function Tests: No results for input(s): TSH, T4TOTAL, FREET4, T3FREE, THYROIDAB in the last 72 hours. Anemia Panel: No results for input(s): VITAMINB12, FOLATE, FERRITIN, TIBC, IRON, RETICCTPCT in the last 72 hours. Sepsis Labs: No results for input(s): PROCALCITON, LATICACIDVEN in the last 168 hours.  No results found for this or any previous visit (from the past 240 hour(s)).       Radiology Studies: Dg Chest 2 View  Result Date: 11/19/2016 CLINICAL DATA:  Chest pain and shortness breath since last night. EXAM: CHEST  2 VIEW COMPARISON:  10/10/2016. FINDINGS: Normal sized heart. Clear lungs with normal vascularity. Coronary artery stents. Mild thoracic spine and bilateral shoulder degenerative changes. Cholecystectomy clip. IMPRESSION: No acute abnormality. Electronically Signed   By: Claudie Revering M.D.   On: 11/19/2016 08:25        Scheduled Meds: . aspirin EC  81 mg Oral Daily  . atorvastatin  80 mg Oral q1800  . clopidogrel  75 mg Oral Daily  . enoxaparin (LOVENOX) injection  40 mg Subcutaneous Q24H  . furosemide  40 mg Oral Daily  . losartan  12.5 mg Oral QPM  . metoprolol tartrate  25 mg Oral BID  . potassium chloride SA  20 mEq Oral Daily  . ranolazine  500 mg Oral BID   Continuous Infusions:   LOS: 0 days    Time  spent: 39mins    Jaylinn Hellenbrand, MD [facility] Pager [phone]  If 7PM-7AM, please contact night-coverage [email or web address] Password [facility] 11/20/2016, 3:55 PM

## 2016-11-21 ENCOUNTER — Encounter (HOSPITAL_COMMUNITY): Payer: PRIVATE HEALTH INSURANCE

## 2016-11-21 DIAGNOSIS — I255 Ischemic cardiomyopathy: Secondary | ICD-10-CM | POA: Diagnosis not present

## 2016-11-21 DIAGNOSIS — R079 Chest pain, unspecified: Secondary | ICD-10-CM | POA: Diagnosis not present

## 2016-11-21 DIAGNOSIS — R072 Precordial pain: Principal | ICD-10-CM

## 2016-11-21 DIAGNOSIS — I5042 Chronic combined systolic (congestive) and diastolic (congestive) heart failure: Secondary | ICD-10-CM | POA: Diagnosis not present

## 2016-11-21 DIAGNOSIS — Z720 Tobacco use: Secondary | ICD-10-CM | POA: Diagnosis not present

## 2016-11-21 DIAGNOSIS — I2510 Atherosclerotic heart disease of native coronary artery without angina pectoris: Secondary | ICD-10-CM | POA: Diagnosis not present

## 2016-11-21 DIAGNOSIS — I10 Essential (primary) hypertension: Secondary | ICD-10-CM | POA: Diagnosis not present

## 2016-11-21 MED ORDER — FAMOTIDINE 20 MG PO TABS
20.0000 mg | ORAL_TABLET | Freq: Two times a day (BID) | ORAL | 1 refills | Status: DC
Start: 2016-11-21 — End: 2016-12-01

## 2016-11-21 NOTE — Discharge Summary (Signed)
Physician Discharge Summary  Erin [name] ZLD:[id number] DOB: 12/29/1960 DOA: 11/19/2016  PCP: Maurice Small, MD  Admit date: 11/19/2016 Discharge date: 11/21/2016  Admitted From:  Disposition:  home  Recommendations for Outpatient Follow-up:  1. Follow up with PCP in 1-2 weeks 2. Please obtain BMP/CBC in one week 3. Follow up with cardiology. They will arrange appointment  Home Health: Equipment/Devices:  Discharge Condition: stable CODE STATUS: full code Diet recommendation: Heart Healthy  Brief/Interim Summary: 56 year old female with a history of coronary artery disease and stents placed in 03/2016, ischemic cardiomyopathy with ejection fraction of 35-40%, admitted to the hospital with typical/atypical chest pain. She was ruled out for ACS with negative cardiac markers. She did have some transient changes on her EKG. She was monitored in the hospital and seen by cardiology. He was felt that her symptoms are atypical. Possibly related to GERD. She's been started on H2 blockers. No further cardiac workup was felt necessary in the hospital and she has been advised to follow-up with cardiology as an outpatient. Patient is no longer having any chest pain today. She is felt stable for discharge home.  Discharge Diagnoses:  Active Problems:   Essential hypertension   Hyperlipidemia   GERD (gastroesophageal reflux disease)   Chest pain   Tobacco abuse   Cardiomyopathy, ischemic   CAD (coronary artery disease)   Chronic combined systolic and diastolic CHF (congestive heart failure) [facility])    Discharge Instructions  Discharge Instructions    Diet - low sodium heart healthy    Complete by:  As directed    Increase activity slowly    Complete by:  As directed      Allergies as of 11/21/2016   No Known Allergies     Medication List    TAKE these medications   acetaminophen 500 MG tablet Commonly known as:  TYLENOL Take 1 tablet (500 mg total) by mouth every 8 (eight) hours  as needed for mild pain, moderate pain or headache.   ALPRAZolam 1 MG tablet Commonly known as:  XANAX Take 0.5-1 mg by mouth 2 (two) times daily as needed for anxiety.   aspirin EC 81 MG tablet Take 81 mg by mouth daily.   atorvastatin 80 MG tablet Commonly known as:  LIPITOR TAKE ONE TABLET BY MOUTH ONCE DAILY SIX IN THE EVENING   calcium carbonate 500 MG chewable tablet Commonly known as:  TUMS - dosed in mg elemental calcium Chew 1 tablet by mouth daily as needed for indigestion or heartburn.   clopidogrel 75 MG tablet Commonly known as:  PLAVIX Take 1 tablet (75 mg total) by mouth daily.   famotidine 20 MG tablet Commonly known as:  PEPCID Take 1 tablet (20 mg total) by mouth 2 (two) times daily.   furosemide 20 MG tablet Commonly known as:  LASIX Take 2 tablets (40 mg total) by mouth daily.   losartan 25 MG tablet Commonly known as:  COZAAR Take 0.5 tablets (12.5 mg total) by mouth every evening.   metoprolol tartrate 25 MG tablet Commonly known as:  LOPRESSOR Take 1 tablet (25 mg total) by mouth 2 (two) times daily.   nitroGLYCERIN 0.4 MG SL tablet Commonly known as:  NITROSTAT DISSOLVE ONE TABLET UNDER THE TONGUE EVERY 5 MINUTES AS NEEDED FOR CHEST PAIN.  DO NOT EXCEED A TOTAL OF 3 DOSES IN 15 MINUTES   potassium chloride SA 20 MEQ tablet Commonly known as:  K-DUR,KLOR-CON Take 1 tablet (20 mEq total) by mouth daily.  ranolazine 500 MG 12 hr tablet Commonly known as:  RANEXA Take 1 tablet (500 mg total) by mouth 2 (two) times daily.       No Known Allergies  Consultations:  cardiology   Procedures/Studies: Dg Chest 2 View  Result Date: 11/19/2016 CLINICAL DATA:  Chest pain and shortness breath since last night. EXAM: CHEST  2 VIEW COMPARISON:  10/10/2016. FINDINGS: Normal sized heart. Clear lungs with normal vascularity. Coronary artery stents. Mild thoracic spine and bilateral shoulder degenerative changes. Cholecystectomy clip. IMPRESSION: No  acute abnormality. Electronically Signed   By: Claudie Revering M.D.   On: 11/19/2016 08:25       Subjective: Feeling better. No further chest pain  Discharge Exam: Vitals:   11/21/16 0500 11/21/16 1438  BP: (!) 115/47 (!) 100/58  Pulse: 73 79  Resp: 20 18  Temp: 97.9 F (36.6 C) 98.2 F (36.8 C)   Vitals:   11/20/16 1500 11/20/16 2126 11/21/16 0500 11/21/16 1438  BP: 95/64 129/75 (!) 115/47 (!) 100/58  Pulse: 80 81 73 79  Resp: 20 20 20 18   Temp: 98.8 F (37.1 C) 97.9 F (36.6 C) 97.9 F (36.6 C) 98.2 F (36.8 C)  TempSrc: Oral Oral Oral Oral  SpO2: 99% 100% 100% 100%  Weight:      Height:        General: Pt is alert, awake, not in acute distress Cardiovascular: RRR, S1/S2 +, no rubs, no gallops Respiratory: CTA bilaterally, no wheezing, no rhonchi Abdominal: Soft, NT, ND, bowel sounds + Extremities: no edema, no cyanosis    The results of significant diagnostics from this hospitalization (including imaging, microbiology, ancillary and laboratory) are listed below for reference.     Microbiology: No results found for this or any previous visit (from the past 240 hour(s)).   Labs: BNP (last 3 results)  Recent Labs  08/04/16 0250 09/04/16 0522 10/10/16 2230  BNP 332.0* 143.0* 379.0*   Basic Metabolic Panel:  Recent Labs Lab 11/19/16 0803  NA 139  K 3.7  CL 103  CO2 26  GLUCOSE 121*  BUN 12  CREATININE 0.84  CALCIUM 10.0   Liver Function Tests:  Recent Labs Lab 11/19/16 0803  AST 25  ALT 19  ALKPHOS 70  BILITOT 0.9  PROT 7.6  ALBUMIN 4.3   No results for input(s): LIPASE, AMYLASE in the last 168 hours. No results for input(s): AMMONIA in the last 168 hours. CBC:  Recent Labs Lab 11/19/16 0759  WBC 11.0*  HGB 12.5  HCT 37.3  MCV 86.1  PLT 432*   Cardiac Enzymes:  Recent Labs Lab 11/19/16 0759 11/19/16 1622 11/19/16 2232  TROPONINI <0.03 <0.03 <0.03   BNP: Invalid input(s): POCBNP CBG: No results for input(s):  GLUCAP in the last 168 hours. D-Dimer No results for input(s): DDIMER in the last 72 hours. Hgb A1c No results for input(s): HGBA1C in the last 72 hours. Lipid Profile No results for input(s): CHOL, HDL, LDLCALC, TRIG, CHOLHDL, LDLDIRECT in the last 72 hours. Thyroid function studies No results for input(s): TSH, T4TOTAL, T3FREE, THYROIDAB in the last 72 hours.  Invalid input(s): FREET3 Anemia work up No results for input(s): VITAMINB12, FOLATE, FERRITIN, TIBC, IRON, RETICCTPCT in the last 72 hours. Urinalysis    Component Value Date/Time   COLORURINE YELLOW 09/04/2016 0500   APPEARANCEUR HAZY (A) 09/04/2016 0500   LABSPEC 1.023 09/04/2016 0500   PHURINE 5.0 09/04/2016 0500   GLUCOSEU NEGATIVE 09/04/2016 0500   HGBUR NEGATIVE 09/04/2016 0500  BILIRUBINUR NEGATIVE 09/04/2016 0500   KETONESUR NEGATIVE 09/04/2016 0500   PROTEINUR NEGATIVE 09/04/2016 0500   NITRITE NEGATIVE 09/04/2016 0500   LEUKOCYTESUR TRACE (A) 09/04/2016 0500   Sepsis Labs Invalid input(s): PROCALCITONIN,  WBC,  LACTICIDVEN Microbiology No results found for this or any previous visit (from the past 240 hour(s)).   Time coordinating discharge: Over 30 minutes  SIGNED:   Kathie Dike, MD  [facility] 11/21/2016, 3:18 PM Pager   If 7PM-7AM, please contact night-coverage [email or web address] Password [email or web address]

## 2016-11-21 NOTE — Discharge Instructions (Signed)
Chest Wall Pain °Chest wall pain is pain in or around the bones and muscles of your chest. Sometimes, an injury causes this pain. Sometimes, the cause may not be known. This pain may take several weeks or longer to get better. °Follow these instructions at home: °Pay attention to any changes in your symptoms. Take these actions to help with your pain: °· Rest as told by your doctor. °· Avoid activities that cause pain. Try not to use your chest, belly (abdominal), or side muscles to lift heavy things. °· If directed, apply ice to the painful area: °? Put ice in a plastic bag. °? Place a towel between your skin and the bag. °? Leave the ice on for 20 minutes, 2-3 times per day. °· Take over-the-counter and prescription medicines only as told by your doctor. °· Do not use tobacco products, including cigarettes, chewing tobacco, and e-cigarettes. If you need help quitting, ask your doctor. °· Keep all follow-up visits as told by your doctor. This is important. ° °Contact a doctor if: °· You have a fever. °· Your chest pain gets worse. °· You have new symptoms. °Get help right away if: °· You feel sick to your stomach (nauseous) or you throw up (vomit). °· You feel sweaty or light-headed. °· You have a cough with phlegm (sputum) or you cough up blood. °· You are short of breath. °This information is not intended to replace advice given to you by your health care provider. Make sure you discuss any questions you have with your health care provider. °Document Released: 10/12/2007 Document Revised: 10/01/2015 Document Reviewed: 07/21/2014 °Elsevier Interactive Patient Education © 2018 [employer] ° °

## 2016-11-21 NOTE — Consult Note (Signed)
Cardiology Consultation:   Patient ID: BALJIT LIEBERT; [id number]; 05-Aug-1960   Admit date: 11/19/2016 Date of Consult: 11/21/2016  Primary Care Provider: Maurice Small, MD Primary Cardiologist: Bronson Ing   Patient Profile:   Erin [name] is a 56 y.o. female with a hx of ischemic cardiomyopathy, CAD with DES to the Prox/mid Cx and DES X 2 to RCA, hypertension, hyperlipidemia, CHF, chronic anxiety,  who is being seen today for the evaluation of recurrent chest pain at the request of Dr. Roderic Palau, Hospitalist.   History of Present Illness:   Erin [name] is a patient well-known to our practice with frequent admissions for recurrent chest pain, history described as above. She presented to the emergency room with complaints of sudden onset of substernal chest discomfort, radiating to the left. This apparently awakened her. She states earlier that day she ate a taco was having some gas pain from that. Went to bed was able to go to sleep but was awakened by chest pressure. Took some Tums with no relief. Was unable to go back to sleep. The chest pressure began around 11 PM and lasted till the following morning unrelenting. She took nitroglycerin sublingual and did feel some release but continued to have pressure and therefore presented to the emergency room. She denies nausea vomiting diaphoresis or dyspnea associated with her chest pressure.  On arrival to the emergency room the patient's blood pressure was 115/100, heart rate 86, O2 sat 1% she was afebrile. Pertinent labs revealed glucose of 121, potassium 3.7, creatinine 0.84. White blood cells were elevated 11.1, she was not found to be anemic. Platelets 432. Chest x-ray was negative for CHF or pneumonia. EKG revealed normal sinus rhythm with nonspecific T-wave for modalities, unchanged since prior EKG in June 2018 to admission. Troponins have been negative 3   The patient was treated with chewable aspirin 324 mg and nitroglycerin sublingual along  with Ativan. The patient is not had any recurrence of severe chest pain since admission with the exception of mild discomfort yesterday morning she got up to use the bathroom. She states she felt her heart racing.   Past Medical History:  Diagnosis Date  . Anxiety state 03/25/2016  . Basal cell carcinoma of forehead   . CHF (congestive heart failure) ([place])   . Coronary artery disease    a. 03/11/16 PCI with DES-->Prox/Mid Cx, 03/14/16 PCI with DES x2-->RCA, EF 30-35%  . Essential hypertension   . GERD (gastroesophageal reflux disease)   . History of pneumonia   . Hyperlipidemia   . Ischemic cardiomyopathy   . Mitral regurgitation   . NSTEMI (non-ST elevated myocardial infarction) ([place]) 03/10/2016  . Squamous cell cancer of skin of nose   . Thrombocytosis ([place]) 03/26/2016  . Wears dentures   . Wears glasses     Past Surgical History:  Procedure Laterality Date  . APPENDECTOMY    . CARDIAC CATHETERIZATION N/A 03/11/2016   Procedure: Left Heart Cath and Coronary Angiography;  Surgeon: Leonie Man, MD;  Location: [facility];  Service: Cardiovascular;  Laterality: N/A;  . CARDIAC CATHETERIZATION N/A 03/11/2016   Procedure: Coronary Stent Intervention;  Surgeon: Leonie Man, MD;  Location: [facility];  Service: Cardiovascular;  Laterality: N/A;  . CARDIAC CATHETERIZATION N/A 03/14/2016   Procedure: Coronary Stent Intervention;  Surgeon: Peter M Martinique, MD;  Location: [facility];  Service: Cardiovascular;  Laterality: N/A;  . CHOLECYSTECTOMY OPEN  1984  . CORONARY ANGIOPLASTY WITH STENT PLACEMENT  03/14/2016  . FINGER ARTHROPLASTY Left 05/14/2013   Procedure: LEFT THUMB CARPAL METACARPAL ARTHROPLASTY;  Surgeon: Tennis Must, MD;  Location: [facility];  Service: Orthopedics;  Laterality: Left;  . TUBAL LIGATION  1987  . VAGINAL HYSTERECTOMY  2009     Inpatient Medications: Scheduled Meds: . aspirin EC  81 mg Oral Daily  . atorvastatin  80 mg  Oral q1800  . clopidogrel  75 mg Oral Daily  . enoxaparin (LOVENOX) injection  40 mg Subcutaneous Q24H  . furosemide  40 mg Oral Daily  . losartan  12.5 mg Oral QPM  . metoprolol tartrate  25 mg Oral BID  . potassium chloride SA  20 mEq Oral Daily  . ranolazine  500 mg Oral BID   Continuous Infusions:  PRN Meds: acetaminophen, ALPRAZolam, gi cocktail, morphine injection, nitroGLYCERIN, ondansetron (ZOFRAN) IV  Allergies:   No Known Allergies  Social History:   Social History   Social History  . Marital status: Married    Spouse name: N/A  . Number of children: N/A  . Years of education: N/A   Occupational History  . Not on file.   Social History Main Topics  . Smoking status: Current Every Day Smoker    Packs/day: 1.00    Years: 15.00    Types: Cigarettes  . Smokeless tobacco: Never Used  . Alcohol use 3.6 oz/week    6 Cans of beer per week     Comment: occ  . Drug use: Yes    Types: Marijuana, MDMA (Ecstacy)     Comment: former  . Sexual activity: Yes    Birth control/ protection: Surgical   Other Topics Concern  . Not on file   Social History Narrative  . No narrative on file    Family History:   The patient's family history includes Diabetes in her mother; Heart failure in her other; Hypertension in her mother; Stroke in her mother.  ROS:  Please see the history of present illness.  ROS  All other ROS reviewed and negative.     Physical Exam/Data:   Vitals:   11/20/16 0557 11/20/16 1500 11/20/16 2126 11/21/16 0500  BP: (!) 96/53 95/64 129/75 (!) 115/47  Pulse: 75 80 81 73  Resp: 20 20 20 20   Temp: 97.7 F (36.5 C) 98.8 F (37.1 C) 97.9 F (36.6 C) 97.9 F (36.6 C)  TempSrc: Oral Oral Oral Oral  SpO2: 98% 99% 100% 100%  Weight:      Height:        Intake/Output Summary (Last 24 hours) at 11/21/16 0936 Last data filed at 11/20/16 1700  Gross per 24 hour  Intake              480 ml  Output                0 ml  Net              480 ml    Filed Weights   11/19/16 0752 11/19/16 1130  Weight: 116 lb (52.6 kg) 117 lb 14.4 oz (53.5 kg)   Body mass index is 23.03 kg/m.  General:  Well nourished, well developed, in no acute distress HEENT: normal Lymph: no adenopathy Neck: no JVD Endocrine:  No thryomegaly Vascular: No carotid bruits; FA pulses 2+ bilaterally without bruits  Cardiac:  normal S1, S2; RRR; no murmur  Lungs:  clear to auscultation bilaterally, no wheezing, rhonchi or rales  Abd: soft, nontender, no hepatomegaly  Ext: no edema Musculoskeletal:  No deformities, BUE and BLE strength normal and equal Skin: warm and dry  Neuro:  CNs 2-12 intact, no focal abnormalities noted Psych:  Normal affect   EKG:  The EKG was personally reviewed and demonstrates:  NSR with non specific T-wave abnormalities, unchanged from previous EKG Telemetry:  Telemetry was personally reviewed and demonstrates:  NSR. Rates up to 88 bpm. No ectopy.   Relevant CV Studies: Echocardiogram Left ventricle: The cavity size was normal. Systolic function was   moderately reduced. The estimated ejection fraction was in the   range of 35% to 40%. Doppler parameters are consistent with   abnormal left ventricular relaxation (grade 1 diastolic   dysfunction). Doppler parameters are consistent with high   ventricular filling pressure. Mild focal basal septal   hypertrophy. - Regional wall motion abnormality: Akinesis and scarring of the   basal inferolateral myocardium; akinesis of the mid inferolateral   and basal anterolateral myocardium; severe hypokinesis of the mid   inferoseptal, mid inferior, mid anterolateral, and apical lateral   myocardium. - Mitral valve: There was mild to moderate regurgitation. - Right ventricle: Systolic function was mildly reduced. - Tricuspid valve: There was mild regurgitation. - Pericardium, extracardiac: There was no pericardial effusion.  Stress NM Study 08/05/2016 Study Result    There was no ST  segment deviation noted during stress.  Findings consistent with prior large inferolateral myocardial infarction. There is no significant peri-infarct ischemia.  This is a high risk study. High risk study based on low LVEF. There is no signifiacnt myocardium currently at jeopardy.  The left ventricular ejection fraction is severely decreased (<30%).   Cardiac Cath 03/14/2016  Conclusion     Mid Cx lesion, 0 %stenosed.  A drug eluting .  A STENT PROMUS PREM MR 2.25X16 drug eluting stent was successfully placed.  A STENT PROMUS PREM MR 2.25X8 drug-eluting stent was successfully placed.  Dist RCA lesion, 60 %stenosed.  Post intervention, there is a 0% residual stenosis.  Post Atrio lesion, 0 %stenosed.  Post intervention, there is a 0% residual stenosis.  A stent was successfully placed.   1. Successful Stenting of the distal RCA/PDA with DES x 2.  Plan: DAPT for one year. Risk factor modification.     Laboratory Data:  Chemistry Recent Labs Lab 11/19/16 0803  NA 139  K 3.7  CL 103  CO2 26  GLUCOSE 121*  BUN 12  CREATININE 0.84  CALCIUM 10.0  GFRNONAA >60  GFRAA >60  ANIONGAP 10     Recent Labs Lab 11/19/16 0803  PROT 7.6  ALBUMIN 4.3  AST 25  ALT 19  ALKPHOS 70  BILITOT 0.9   Hematology Recent Labs Lab 11/19/16 0759  WBC 11.0*  RBC 4.33  HGB 12.5  HCT 37.3  MCV 86.1  MCH 28.9  MCHC 33.5  RDW 14.9  PLT 432*   Cardiac Enzymes Recent Labs Lab 11/19/16 0759 11/19/16 1622 11/19/16 2232  TROPONINI <0.03 <0.03 <0.03   No results for input(s): TROPIPOC in the last 168 hours.  BNPNo results for input(s): BNP, PROBNP in the last 168 hours.  DDimer No results for input(s): DDIMER in the last 168 hours.  Radiology/Studies:  Dg Chest 2 View  Result Date: 11/19/2016 CLINICAL DATA:  Chest pain and shortness breath since last night. EXAM: CHEST  2 VIEW COMPARISON:  10/10/2016. FINDINGS: Normal sized heart. Clear lungs with normal  vascularity. Coronary artery stents. Mild thoracic spine and bilateral shoulder degenerative changes.  Cholecystectomy clip. IMPRESSION: No acute abnormality. Electronically Signed   By: Claudie Revering M.D.   On: 11/19/2016 08:25    Assessment and Plan:   1. Recurrent chest pain: Patient describes it as constant pressure lasting approximately 6 hours unrelenting after eating a taco and feeling some gas pain. Not relieved with Tums. Unable to sleep due to chest pressure, took nitroglycerin with some relief, and had total relief on admission to the ER with aspirin and sublingual nitroglycerin and Ativan. She did have recurrent discomfort around 6:00 in the morning the following day, which she describes as not as heavy, associated with racing heart rate.  Review of telemetry over the last several days does not reveal any rapid heart rhythm, PVCs, PAF, or nonsustained atrial tachycardia. Highest heart rate was 88 bpm.  Troponin has been negative 3. EKG does not show evidence of ACS. The patient has been admitted on several occasions for recurrent chest pain. Most recent nuclear medicine stress test and March of 2018 did not reveal any new areas of ischemia, with no significant peri-infarct ischemia. She was noted to have reduced EF.  Patient appears to be atypical, with troponins flat no EKG changes. Cardiac catheterization with drug-eluting stent placement was completed a year ago in November 2017. We'll discuss with Dr. Harrington Challenger need to repeat catheterization E recurrent chest pain despite negative troponin and no new areas of ischemia per recent stress test.  2. Coronary artery disease: As discussed above. Drug-eluting stents to circumflex and right coronary artery. She remains on dual antiplatelet therapy. ARB beta blocker and aspirin. She also is on ranolazine 500 mg twice a day. Can consider increasing to 1000 mg twice a day.   3. Ischemic cardiomyopathy: Currently on Lasix 40 mg daily. Did not tolerate  Entresto per previous notes.  4. Ongoing tobacco abuse: Unfortunately continues to smoke despite smoking cessation recommendation and counseling on each visit.  5. Chronic anxiety: Significant contributor to patient's symptoms.  6. GERD: Relief with GI cocktail and PPI.   Signed, Jory [facility] DNP, ANP-C 11/21/2016 9:36 AM   Patient seen and examined  I agree with findingas as noted above by Arnold Long  I have amended that note above   Pt is a 56 yo with known CAD Woke up with chest tightness  Chest tightness is atypcial   ical  Occurred at rest.   Not with activity   Breathing is OK She ruled out for MI    She does note food and liquids being slow to move  Denies reflux   On exam:  Pt thin 56 yo in NAD  LUngs rel clear  Cardiac exam RRR  No signif murmurs  Abdomen Benign  Ext without signif edema  Atypical CP  Does not appear cardiac  ? GI  Would rx for reflux  ? If motility evals.  2  CAD  Continue current meds     3  GI  As above   4  HTN    Folow    PT OK to d/c from cardiac standpoint WIll make sure has f/u in clinic

## 2016-11-28 ENCOUNTER — Encounter (HOSPITAL_COMMUNITY): Payer: PRIVATE HEALTH INSURANCE

## 2016-12-01 ENCOUNTER — Encounter: Payer: Self-pay | Admitting: Neurology

## 2016-12-01 ENCOUNTER — Ambulatory Visit (INDEPENDENT_AMBULATORY_CARE_PROVIDER_SITE_OTHER): Payer: PRIVATE HEALTH INSURANCE | Admitting: Neurology

## 2016-12-01 VITALS — BP 100/60 | HR 83 | Ht 63.0 in | Wt 118.2 lb

## 2016-12-01 DIAGNOSIS — G459 Transient cerebral ischemic attack, unspecified: Secondary | ICD-10-CM | POA: Diagnosis not present

## 2016-12-01 DIAGNOSIS — R29818 Other symptoms and signs involving the nervous system: Secondary | ICD-10-CM

## 2016-12-01 DIAGNOSIS — R29898 Other symptoms and signs involving the musculoskeletal system: Secondary | ICD-10-CM | POA: Diagnosis not present

## 2016-12-01 NOTE — Progress Notes (Signed)
[facility] Clinic Note - Initial Visit   Date: 12/01/16  EARLY ORD MRN: [id number] DOB: [birth year]   Dear Dr. Justin Mend:  Thank you for your kind referral of Erin [name] for consultation of left sided sensory loss and weakness. Although her history is well known to you, please allow [place] to reiterate it for the purpose of our medical record. The patient was accompanied to the clinic by self.    History of Present Illness: Erin [name] is a 56 y.o. right-handed Caucasian female with GERD, CAD s/p MI and PCI with 2 stents (2017), hypertension, CHF, tobacco use (trying to quit) and anxiety presenting for evaluation of left arm and leg weakness.    She was admitted to [facility] in November 2017 with NSTEMI and underwent emergent stenting of occluded LCx and distal RCA.  She was started on aspirin, plavix, atorvastatin 80mg , and ranexa. Starting around late November 2017, she started spells of left arm prickly, needle-like sensation of the entire left arm (upper, forearm, and fingers) along with numbness of the left leg which lasts about 5 minutes.  The leg can be completely numb and cannot feel it. She has severe weakness of the left leg, as if it is paralyzed and has to lift the left with her arms to be able to move it.  She has suffered several falls with these spells when her leg completely gives out. Within a few minutes, her sensation returns and weakness resolves.  It was occurring about twice per week until May and did not have any spells in June.  Her most recent episode occurred 4 days ago.   She denies any associated facial numbness/tingling or weakness, vision changes, or imbalance.  No identifiable triggers.  No similar symptoms on the right side.   She has family history of heart disease and stroke in her mother and father.  She has a long history of tobacco use and is trying to quit, currently down to two cigarettes daily.  Atorvastatin 80mg   was discontinued after her lipid panel showed marked improvement and last LDL is 32.   Out-side paper records, electronic medical record, and images have been reviewed where available and summarized as:  MRI lumbar spine wwo contrast 06/10/2016:  Mild spondylosis L4-5 and L5-S1. The central canal and foramina are widely patent at all levels. No finding to explain the patient's symptoms.  Lab Results  Component Value Date   CHOL 107 05/17/2016   HDL 41 05/17/2016   LDLCALC 32 05/17/2016   TRIG 168 (H) 05/17/2016   CHOLHDL 2.6 05/17/2016   Lab Results  Component Value Date   HGBA1C 5.4 05/16/2016   Lab Results  Component Value Date   VITAMINB12 883 03/18/2016     Past Medical History:  Diagnosis Date  . Anxiety state 03/25/2016  . Basal cell carcinoma of forehead   . CHF (congestive heart failure) ([place])   . Coronary artery disease    a. 03/11/16 PCI with DES-->Prox/Mid Cx, 03/14/16 PCI with DES x2-->RCA, EF 30-35%  . Essential hypertension   . GERD (gastroesophageal reflux disease)   . History of pneumonia   . Hyperlipidemia   . Ischemic cardiomyopathy   . Mitral regurgitation   . NSTEMI (non-ST elevated myocardial infarction) ([place]) 03/10/2016  . Squamous cell cancer of skin of nose   . Thrombocytosis ([place]) 03/26/2016  . Wears dentures   . Wears glasses     Past Surgical History:  Procedure Laterality Date  .  APPENDECTOMY    . CARDIAC CATHETERIZATION N/A 03/11/2016   Procedure: Left Heart Cath and Coronary Angiography;  Surgeon: Leonie Man, MD;  Location: [facility];  Service: Cardiovascular;  Laterality: N/A;  . CARDIAC CATHETERIZATION N/A 03/11/2016   Procedure: Coronary Stent Intervention;  Surgeon: Leonie Man, MD;  Location: [facility];  Service: Cardiovascular;  Laterality: N/A;  . CARDIAC CATHETERIZATION N/A 03/14/2016   Procedure: Coronary Stent Intervention;  Surgeon: Peter M Martinique, MD;  Location: [facility];  Service:  Cardiovascular;  Laterality: N/A;  . CHOLECYSTECTOMY OPEN  1984  . CORONARY ANGIOPLASTY WITH STENT PLACEMENT  03/14/2016  . FINGER ARTHROPLASTY Left 05/14/2013   Procedure: LEFT THUMB CARPAL METACARPAL ARTHROPLASTY;  Surgeon: Tennis Must, MD;  Location: [facility];  Service: Orthopedics;  Laterality: Left;  . TUBAL LIGATION  1987  . VAGINAL HYSTERECTOMY  2009     Medications:  Outpatient Encounter Prescriptions as of 12/01/2016  Medication Sig  . acetaminophen (TYLENOL) 500 MG tablet Take 1 tablet (500 mg total) by mouth every 8 (eight) hours as needed for mild pain, moderate pain or headache.  . ALPRAZolam (XANAX) 1 MG tablet Take 0.5-1 mg by mouth 2 (two) times daily as needed for anxiety.  [name] Kitchen aspirin EC 81 MG tablet Take 81 mg by mouth daily.  [name] Kitchen atorvastatin (LIPITOR) 80 MG tablet TAKE ONE TABLET BY MOUTH ONCE DAILY SIX IN THE EVENING  . calcium carbonate (TUMS - DOSED IN MG ELEMENTAL CALCIUM) 500 MG chewable tablet Chew 1 tablet by mouth daily as needed for indigestion or heartburn.  . clopidogrel (PLAVIX) 75 MG tablet Take 1 tablet (75 mg total) by mouth daily.  . famotidine (PEPCID) 20 MG tablet Take 1 tablet (20 mg total) by mouth 2 (two) times daily.  . furosemide (LASIX) 20 MG tablet Take 2 tablets (40 mg total) by mouth daily.  [name] Kitchen losartan (COZAAR) 25 MG tablet Take 0.5 tablets (12.5 mg total) by mouth every evening.  . metoprolol tartrate (LOPRESSOR) 25 MG tablet Take 1 tablet (25 mg total) by mouth 2 (two) times daily.  . nitroGLYCERIN (NITROSTAT) 0.4 MG SL tablet DISSOLVE ONE TABLET UNDER THE TONGUE EVERY 5 MINUTES AS NEEDED FOR CHEST PAIN.  DO NOT EXCEED A TOTAL OF 3 DOSES IN 15 MINUTES  . potassium chloride SA (K-DUR,KLOR-CON) 20 MEQ tablet Take 1 tablet (20 mEq total) by mouth daily.  . ranolazine (RANEXA) 500 MG 12 hr tablet Take 1 tablet (500 mg total) by mouth 2 (two) times daily.   No facility-administered encounter medications on file as of 12/01/2016.       Allergies: No Known Allergies  Family History: Family History  Problem Relation Age of Onset  . Stroke Mother   . Hypertension Mother   . Diabetes Mother   . Heart failure Other     Social History: Social History  Substance Use Topics  . Smoking status: Current Every Day Smoker    Packs/day: 1.00    Years: 15.00    Types: Cigarettes  . Smokeless tobacco: Never Used  . Alcohol use 3.6 oz/week    6 Cans of beer per week     Comment: occ   Social History   Social History Narrative  . No narrative on file    Review of Systems:  CONSTITUTIONAL: No fevers, chills, night sweats, or weight loss.   EYES: No visual changes or eye pain ENT: No hearing changes.  No history of  nose bleeds.   RESPIRATORY: No cough, wheezing and shortness of breath.   CARDIOVASCULAR: Negative for chest pain, and palpitations.   GI: Negative for abdominal discomfort, blood in stools or black stools.  No recent change in bowel habits.   GU:  No history of incontinence.   MUSCLOSKELETAL: No history of joint pain or swelling.  No myalgias.   SKIN: Negative for lesions, rash, and itching.   HEMATOLOGY/ONCOLOGY: Negative for prolonged bleeding, bruising easily, and swollen nodes.  No history of cancer.   ENDOCRINE: Negative for cold or heat intolerance, polydipsia or goiter.   PSYCH:  No depression or anxiety symptoms.   NEURO: As Above.   Vital Signs:  BP 100/60   Pulse 83   Ht 5\' 3"  (1.6 m)   Wt 118 lb 4 oz (53.6 kg)   SpO2 97%   BMI 20.95 kg/m    General Medical Exam:   General:  Well appearing, comfortable.   Eyes/ENT: see cranial nerve examination.   Neck: No masses appreciated.  Full range of motion without tenderness.  No carotid bruits. Respiratory:  Clear to auscultation, good air entry bilaterally.   Cardiac:  Regular rate and rhythm, no murmur.   Extremities:  No deformities, edema, or skin discoloration.  Skin:  No rashes or lesions.  Neurological Exam: MENTAL STATUS  including orientation to time, place, person, recent and remote memory, attention span and concentration, language, and fund of knowledge is normal.  Speech is not dysarthric.  CRANIAL NERVES: II:  No visual field defects.  Unremarkable fundi.   III-IV-VI: Pupils equal round and reactive to light.  Normal conjugate, extra-ocular eye movements in all directions of gaze.  No nystagmus.  No ptosis.   V:  Normal facial sensation.    VII:  Normal facial symmetry and movements.   VIII:  Normal hearing and vestibular function.   IX-X:  Normal palatal movement.   XI:  Normal shoulder shrug and head rotation.   XII:  Normal tongue strength and range of motion, no deviation or fasciculation.  MOTOR:  No atrophy, fasciculations or abnormal movements.  No pronator drift.  Tone is normal.    Right Upper Extremity:    Left Upper Extremity:    Deltoid  5/5   Deltoid  5-/5   Biceps  5/5   Biceps  5-/5   Triceps  5/5   Triceps  5-/5   Wrist extensors  5/5   Wrist extensors  5/5   Wrist flexors  5/5   Wrist flexors  5/5   Finger extensors  5/5   Finger extensors  5/5   Finger flexors  5/5   Finger flexors  5/5   Dorsal interossei  5/5   Dorsal interossei  5/5   Abductor pollicis  5/5   Abductor pollicis  5/5   Tone (Ashworth scale)  0  Tone (Ashworth scale)  0   Right Lower Extremity:    Left Lower Extremity:    Hip flexors  5/5   Hip flexors  5-/5   Hip extensors  5/5   Hip extensors  5/5   Knee flexors  5/5   Knee flexors  5/5   Knee extensors  5/5   Knee extensors  5/5   Dorsiflexors  5/5   Dorsiflexors  5/5   Plantarflexors  5/5   Plantarflexors  5/5   Toe extensors  5/5   Toe extensors  5/5   Toe flexors  5/5   Toe flexors  5/5   Tone (Ashworth scale)  0  Tone (Ashworth scale)  0   MSRs:  Right                                                                 Left brachioradialis 2+  brachioradialis 3+  biceps 2+  biceps 3+  triceps 2+  triceps 3+  patellar 2+  patellar 3+  ankle jerk 2+   ankle jerk 2+  Hoffman no  Hoffman no  plantar response down  plantar response down   SENSORY:  Normal and symmetric perception of light touch, pinprick, vibration, and proprioception.  Romberg's sign absent.   COORDINATION/GAIT: Normal finger-to- nose-finger and heel-to-shin.  Intact rapid alternating movements bilaterally.  Able to rise from a chair without using arms.  Gait narrow based and stable. Tandem and stressed gait intact.    IMPRESSION: Mrs. Lender is a 56 year-old female with recent history of CAD s/p PCI (2017) referred for evaluation of spells of left hemisensory changes and left leg weakness.  Her symptoms are transient lasting < 5 minutes and manifest with left arm paresthesias, left leg weakness and numbness, these are most concerning for TIA due to right MCA territory vascular disease.  With her known cardiovascular disease, I anticipate she also has cerebrovascular disease and this needs to be assessed with ASAP MRI/A head and US carotids.  She is on aspirin 81mg  and plavix 75mg  daily. She is no longer on atorvastatin due to LDL being lowered to 32 with statin therapy.  I may need to reconsider adding low-dose statin therapy, if there is moderate to severe intracranial stenosis. Blood pressure is well controlled and she does not have history of diabetes.  In the meatime, she will continue dual antiplatelet therapy and I strongly urged her to quit smoking which she is working on.  Stroke warning signs were discussed with patient and she was informed to go directly to the ER if she develops any further neurological symptoms.  Further recommendations will be based on her testing results.  The duration of this appointment visit was 45 minutes of face-to-face time with the patient.  Greater than 50% of this time was spent in counseling, explanation of diagnosis, planning of further management, and coordination of care.   Thank you for allowing me to participate in patient's care.  If I  can answer any additional questions, I would be pleased to do so.    Sincerely,    Dvante Hands K. Posey Pronto, DO

## 2016-12-01 NOTE — Patient Instructions (Addendum)
1.  MRI brain without contrast 2.  MRA head  3.  US carotids 4.  Continue aspirin and plavix as you are taking 5.  Strongly encouraged to stop smoking  We will call you with the results and let you know the next step.  Know these warning signs of stroke. Every second counts:  Sudden numbness or weakness of the face, arm or leg, especially on one side of the body,  Sudden confusion, trouble speaking or understanding,  Sudden trouble seeing in one eye or both eyes,  Sudden trouble walking, dizziness, loss of balance or coordination,  Sudden severe headache with no known cause.  If you or someone with you has one or more of these signs, don't delay!  Immediately call [id number], or the emergency medical services (EMS) number so an ambulance (ideally with advanced life support) can be sent for you.  Also, check the time so that you will know when the symptoms first appeared. It's very important to take immediate action. Medical treatment may be available if action is taken early enough.

## 2016-12-05 ENCOUNTER — Encounter (HOSPITAL_COMMUNITY): Payer: PRIVATE HEALTH INSURANCE

## 2016-12-06 ENCOUNTER — Ambulatory Visit (HOSPITAL_COMMUNITY)
Admission: RE | Admit: 2016-12-06 | Discharge: 2016-12-06 | Disposition: A | Discharge status: Home / Self Care | Payer: PRIVATE HEALTH INSURANCE | Source: Ambulatory Visit | Attending: Cardiovascular Disease | Admitting: Cardiovascular Disease

## 2016-12-06 DIAGNOSIS — R29898 Other symptoms and signs involving the musculoskeletal system: Secondary | ICD-10-CM | POA: Diagnosis not present

## 2016-12-06 DIAGNOSIS — R29818 Other symptoms and signs involving the nervous system: Secondary | ICD-10-CM | POA: Diagnosis not present

## 2016-12-06 DIAGNOSIS — G459 Transient cerebral ischemic attack, unspecified: Secondary | ICD-10-CM | POA: Diagnosis not present

## 2016-12-09 ENCOUNTER — Telehealth: Payer: Self-pay | Admitting: *Deleted

## 2016-12-09 NOTE — Telephone Encounter (Signed)
Patient given results and instructions.   

## 2016-12-09 NOTE — Telephone Encounter (Signed)
-----   Message from Alda Berthold, DO sent at 12/08/2016 11:12 AM EDT ----- Please inform patient that her ultrasound shows carotid disease and plaque buildup which is mild on the right and moderate on the left. Her left arm and leg numbness and tingling is not stemming from her carotid disease as I would expect her right carotid artery to be worse. I will await the results of her MRI/A of further recommendations. Continue medications as she is taking.

## 2016-12-12 ENCOUNTER — Encounter (HOSPITAL_COMMUNITY): Payer: PRIVATE HEALTH INSURANCE

## 2016-12-15 ENCOUNTER — Telehealth: Payer: Self-pay | Admitting: Adult Health

## 2016-12-15 ENCOUNTER — Encounter (HOSPITAL_COMMUNITY): Payer: Self-pay

## 2016-12-15 ENCOUNTER — Emergency Department (HOSPITAL_COMMUNITY)
Admission: EM | Admit: 2016-12-15 | Discharge: 2016-12-15 | Disposition: A | Discharge status: Home / Self Care | Payer: PRIVATE HEALTH INSURANCE | Attending: Emergency Medicine | Admitting: Emergency Medicine

## 2016-12-15 ENCOUNTER — Emergency Department (HOSPITAL_COMMUNITY): Payer: PRIVATE HEALTH INSURANCE

## 2016-12-15 DIAGNOSIS — Z79899 Other long term (current) drug therapy: Secondary | ICD-10-CM | POA: Diagnosis not present

## 2016-12-15 DIAGNOSIS — I5043 Acute on chronic combined systolic (congestive) and diastolic (congestive) heart failure: Secondary | ICD-10-CM | POA: Diagnosis not present

## 2016-12-15 DIAGNOSIS — F17210 Nicotine dependence, cigarettes, uncomplicated: Secondary | ICD-10-CM | POA: Insufficient documentation

## 2016-12-15 DIAGNOSIS — I2510 Atherosclerotic heart disease of native coronary artery without angina pectoris: Secondary | ICD-10-CM | POA: Insufficient documentation

## 2016-12-15 DIAGNOSIS — I110 Hypertensive heart disease with heart failure: Secondary | ICD-10-CM | POA: Insufficient documentation

## 2016-12-15 DIAGNOSIS — Z7982 Long term (current) use of aspirin: Secondary | ICD-10-CM | POA: Insufficient documentation

## 2016-12-15 DIAGNOSIS — K625 Hemorrhage of anus and rectum: Secondary | ICD-10-CM | POA: Diagnosis not present

## 2016-12-15 DIAGNOSIS — E279 Disorder of adrenal gland, unspecified: Secondary | ICD-10-CM | POA: Diagnosis not present

## 2016-12-15 DIAGNOSIS — R1032 Left lower quadrant pain: Secondary | ICD-10-CM

## 2016-12-15 DIAGNOSIS — R1030 Lower abdominal pain, unspecified: Secondary | ICD-10-CM | POA: Diagnosis present

## 2016-12-15 DIAGNOSIS — Z7902 Long term (current) use of antithrombotics/antiplatelets: Secondary | ICD-10-CM | POA: Diagnosis not present

## 2016-12-15 LAB — CBC
HCT: 36.9 % (ref 36.0–46.0)
Hemoglobin: 12.5 g/dL (ref 12.0–15.0)
MCH: 29.1 pg (ref 26.0–34.0)
MCHC: 33.9 g/dL (ref 30.0–36.0)
MCV: 86 fL (ref 78.0–100.0)
Platelets: 430 10*3/uL — ABNORMAL HIGH (ref 150–400)
RBC: 4.29 MIL/uL (ref 3.87–5.11)
RDW: 15 % (ref 11.5–15.5)
WBC: 9.7 10*3/uL (ref 4.0–10.5)

## 2016-12-15 LAB — COMPREHENSIVE METABOLIC PANEL
ALT: 17 U/L (ref 14–54)
AST: 21 U/L (ref 15–41)
Albumin: 3.8 g/dL (ref 3.5–5.0)
Alkaline Phosphatase: 68 U/L (ref 38–126)
Anion gap: 9 (ref 5–15)
BUN: 16 mg/dL (ref 6–20)
CO2: 24 mmol/L (ref 22–32)
Calcium: 9.6 mg/dL (ref 8.9–10.3)
Chloride: 105 mmol/L (ref 101–111)
Creatinine, Ser: 0.88 mg/dL (ref 0.44–1.00)
GFR calc Af Amer: >60 mL/min (ref >60)
GFR calc non Af Amer: >60 mL/min (ref >60)
Glucose, Bld: 104 mg/dL — ABNORMAL HIGH (ref 65–99)
Potassium: 3.6 mmol/L (ref 3.5–5.1)
Sodium: 138 mmol/L (ref 135–145)
Total Bilirubin: 0.8 mg/dL (ref 0.3–1.2)
Total Protein: 6.8 g/dL (ref 6.5–8.1)

## 2016-12-15 LAB — POC OCCULT BLOOD, ED: Fecal Occult Bld: NEGATIVE

## 2016-12-15 LAB — TYPE AND SCREEN
ABO/RH(D): O POS
Antibody Screen: NEGATIVE

## 2016-12-15 MED ORDER — IOPAMIDOL (ISOVUE-300) INJECTION 61%
INTRAVENOUS | Status: AC
Start: 2016-12-15 — End: 2016-12-15
  Administered 2016-12-15: 100 mL
  Filled 2016-12-15: qty 30

## 2016-12-15 MED ORDER — SODIUM CHLORIDE 0.9 % IV BOLUS (SEPSIS)
1000.0000 mL | Freq: Once | INTRAVENOUS | Status: AC
  Administered 2016-12-15: 1000 mL via INTRAVENOUS

## 2016-12-15 NOTE — ED Triage Notes (Signed)
Pt reports was nauseated earlier today then this afternoon she started passing bright red blood in stool.  Reports has had 4 episodes of  Rectal bleeding this afternoon.  C/O lower abd cramping.

## 2016-12-15 NOTE — Telephone Encounter (Signed)
Spoke with patient and she c/o seeing blood on tissue after pooping about 1/2 the size of a slice of bread that is bright red. Also c/o diarrhea x'2  In the last hour. Patient said that se felt a little dizziness that started this morning around 9:00 am. No c/o chest pain or sob.   Patient advised to contact her PCP with these issues and if they felt her issue was cardiac in nature, have them contact us directly for an appointment. Patient verbalized understanding of plan.

## 2016-12-15 NOTE — Discharge Instructions (Signed)
As discussed, today's evaluation has been largely reassuring. There is some suspicion for ongoing viral illness causing her abdominal pain. The bleeding is likely due to your requirement of using Plavix and aspirin to control your heart disease.  Today's CT scan, was very reassuring, but there was identification of a small undefined area near your left adrenal gland. This should be discussed with your primary care physician, to arrange additional evaluation.  Return here for concerning changes in your condition.

## 2016-12-15 NOTE — ED Provider Notes (Signed)
[facility] DEPT Provider Note   CSN: [id number] Arrival date & time: 12/15/16  1652     History   Chief Complaint Chief Complaint  Patient presents with  . GI Bleeding    HPI Erin [name] is a 56 y.o. female.  HPI  Patient presents with concern of lower abdominal pain, diarrhea, and bloody stool. Patient notes that she feels generally unwell, but only with past day or so has felt particularly poorly. Over the past day patient has had at least 3 loose bloody stool. She notes that the initial stool was red, but subsequent bowel movements had been clear. Patient also notes ongoing pain in her mid and left lower abdomen. Pain is sharp, severe. No history of diverticulitis, but she does have history of prior abdominal surgery. Patient also takes brilinta and aspirin for history of coronary disease.   Past Medical History:  Diagnosis Date  . Anxiety state 03/25/2016  . Basal cell carcinoma of forehead   . CHF (congestive heart failure) ([place])   . Coronary artery disease    a. 03/11/16 PCI with DES-->Prox/Mid Cx, 03/14/16 PCI with DES x2-->RCA, EF 30-35%  . Essential hypertension   . GERD (gastroesophageal reflux disease)   . History of pneumonia   . Hyperlipidemia   . Ischemic cardiomyopathy   . Mitral regurgitation   . NSTEMI (non-ST elevated myocardial infarction) ([place]) 03/10/2016  . Squamous cell cancer of skin of nose   . Thrombocytosis ([place]) 03/26/2016  . Wears dentures   . Wears glasses     Patient Active Problem List   Diagnosis Date Noted  . Chronic combined systolic and diastolic CHF (congestive heart failure) ([place]) 06/20/2016  . Hypokalemia   . Acute CHF (congestive heart failure) ([place]) 05/16/2016  . Acute on chronic systolic CHF (congestive heart failure) ([place]) 05/16/2016  . Acute respiratory failure with hypoxia ([place])   . Thrombocytosis ([place]) 03/26/2016  . Cardiomyopathy, ischemic 03/25/2016  . Acute on chronic combined systolic and diastolic  congestive heart failure ([place]) 03/25/2016  . Anxiety state 03/25/2016  . Troponin level elevated 03/25/2016  . CAD (coronary artery disease) 03/25/2016  . Normocytic anemia 03/25/2016  . SOB (shortness of breath) 03/24/2016  . Lightheadedness 03/17/2016  . Hypotension 03/17/2016  . Tobacco abuse 03/12/2016  . NSTEMI (non-ST elevated myocardial infarction) ([place]) 03/11/2016  . Pain in the chest   . Essential hypertension 09/06/2015  . Hyperlipidemia 09/06/2015  . GERD (gastroesophageal reflux disease) 09/06/2015  . Chest pain 09/06/2015    Past Surgical History:  Procedure Laterality Date  . APPENDECTOMY    . CARDIAC CATHETERIZATION N/A 03/11/2016   Procedure: Left Heart Cath and Coronary Angiography;  Surgeon: Leonie Man, MD;  Location: [facility];  Service: Cardiovascular;  Laterality: N/A;  . CARDIAC CATHETERIZATION N/A 03/11/2016   Procedure: Coronary Stent Intervention;  Surgeon: Leonie Man, MD;  Location: [facility];  Service: Cardiovascular;  Laterality: N/A;  . CARDIAC CATHETERIZATION N/A 03/14/2016   Procedure: Coronary Stent Intervention;  Surgeon: Peter M Martinique, MD;  Location: [facility];  Service: Cardiovascular;  Laterality: N/A;  . CHOLECYSTECTOMY OPEN  1984  . CORONARY ANGIOPLASTY WITH STENT PLACEMENT  03/14/2016  . FINGER ARTHROPLASTY Left 05/14/2013   Procedure: LEFT THUMB CARPAL METACARPAL ARTHROPLASTY;  Surgeon: Tennis Must, MD;  Location: [facility];  Service: Orthopedics;  Laterality: Left;  . TUBAL LIGATION  1987  . VAGINAL HYSTERECTOMY  2009    OB History  Gravida Para Term Preterm AB Living   4 4 4     4    SAB TAB Ectopic Multiple Live Births                   Home Medications    Prior to Admission medications   Medication Sig Start Date End Date Taking? Authorizing Provider  acetaminophen (TYLENOL) 500 MG tablet Take 1 tablet (500 mg total) by mouth every 8 (eight) hours as needed for mild pain,  moderate pain or headache. 03/15/16   Cheryln Manly, NP  ALPRAZolam Duanne Moron) 1 MG tablet Take 0.5-1 mg by mouth 2 (two) times daily as needed for anxiety.    [provider]  aspirin EC 81 MG tablet Take 81 mg by mouth daily.    [provider]  calcium carbonate (TUMS - DOSED IN MG ELEMENTAL CALCIUM) 500 MG chewable tablet Chew 1 tablet by mouth daily as needed for indigestion or heartburn.    [provider]  clopidogrel (PLAVIX) 75 MG tablet Take 1 tablet (75 mg total) by mouth daily. 06/02/16   Lendon Colonel, NP  furosemide (LASIX) 20 MG tablet Take 2 tablets (40 mg total) by mouth daily. 07/08/16   Eileen Stanford, PA-C  losartan (COZAAR) 25 MG tablet Take 0.5 tablets (12.5 mg total) by mouth every evening. 08/22/16   Lendon Colonel, NP  metoprolol tartrate (LOPRESSOR) 25 MG tablet Take 1 tablet (25 mg total) by mouth 2 (two) times daily. 10/18/16 01/16/17  Lendon Colonel, NP  nitroGLYCERIN (NITROSTAT) 0.4 MG SL tablet DISSOLVE ONE TABLET UNDER THE TONGUE EVERY 5 MINUTES AS NEEDED FOR CHEST PAIN.  DO NOT EXCEED A TOTAL OF 3 DOSES IN 15 MINUTES 09/07/16   Josue Hector, MD  potassium chloride SA (K-DUR,KLOR-CON) 20 MEQ tablet Take 1 tablet (20 mEq total) by mouth daily. 09/07/16   Josue Hector, MD  ranolazine (RANEXA) 500 MG 12 hr tablet Take 1 tablet (500 mg total) by mouth 2 (two) times daily. 08/22/16   Lendon Colonel, NP    Family History Family History  Problem Relation Age of Onset  . Diabetes Father   . Hypertension Father   . CAD Father   . Stroke Mother   . Hypertension Mother   . Diabetes Mother   . Heart failure Other     Social History Social History  Substance Use Topics  . Smoking status: Current Every Day Smoker    Packs/day: 1.00    Years: 15.00    Types: Cigarettes  . Smokeless tobacco: Never Used  . Alcohol use 3.6 oz/week    6 Cans of beer per week     Comment: occ     Allergies   Patient has no known  allergies.   Review of Systems Review of Systems  Constitutional:       Per HPI, otherwise negative  HENT:       Per HPI, otherwise negative  Respiratory:       Per HPI, otherwise negative  Cardiovascular:       Per HPI, otherwise negative  Gastrointestinal: Positive for abdominal pain, anal bleeding and nausea. Negative for vomiting.  Endocrine:       Negative aside from HPI  Genitourinary:       Neg aside from HPI   Musculoskeletal:       Per HPI, otherwise negative  Skin: Negative.   Neurological: Negative for syncope.     Physical Exam  Updated Vital Signs BP 121/78   Pulse 87   Temp 98.1 F (36.7 C) (Oral)   Resp (!) 22   Ht 5' (1.524 m)   Wt 53.5 kg (118 lb)   SpO2 99%   BMI 23.05 kg/m   Physical Exam  Constitutional: She is oriented to person, place, and time. She appears well-developed and well-nourished. No distress.  HENT:  Head: Normocephalic and atraumatic.  Eyes: Conjunctivae and EOM are normal.  Cardiovascular: Normal rate and regular rhythm.   Pulmonary/Chest: Effort normal and breath sounds normal. No stridor. No respiratory distress.  Abdominal: She exhibits no distension. There is tenderness in the suprapubic area and left lower quadrant.  Genitourinary: Rectal exam shows guaiac negative stool.  Genitourinary Comments: 2 small nonbleeding external hemorrhoids  Musculoskeletal: She exhibits no edema.  Neurological: She is alert and oriented to person, place, and time. No cranial nerve deficit.  Skin: Skin is warm and dry.  Psychiatric: She has a normal mood and affect.  Nursing note and vitals reviewed.    ED Treatments / Results  Labs (all labs ordered are listed, but only abnormal results are displayed) Labs Reviewed  COMPREHENSIVE METABOLIC PANEL - Abnormal; Notable for the following:       Result Value   Glucose, Bld 104 (*)    All other components within normal limits  CBC - Abnormal; Notable for the following:    Platelets 430 (*)     All other components within normal limits  POC OCCULT BLOOD, ED  TYPE AND SCREEN    EKG  EKG Interpretation  Date/Time:  Thursday December 15 2016 17:04:16 EDT Ventricular Rate:  96 PR Interval:    QRS Duration: 94 QT Interval:  362 QTC Calculation: 458 R Axis:   11 Text Interpretation:  Sinus rhythm Low voltage, extremity leads Abnormal R-wave progression, early transition T wave abnormality Abnormal ekg Confirmed by Carmin Muskrat [phone]) on 12/15/2016 5:10:27 PM       Radiology Ct Abdomen Pelvis W Contrast  Result Date: 12/15/2016 CLINICAL DATA:  Acute onset of generalized abdominal pain. Initial encounter. EXAM: CT ABDOMEN AND PELVIS WITH CONTRAST TECHNIQUE: Multidetector CT imaging of the abdomen and pelvis was performed using the standard protocol following bolus administration of intravenous contrast. CONTRAST:  174mL ISOVUE-300 IOPAMIDOL (ISOVUE-300) INJECTION 61% COMPARISON:  MRI of the lumbar spine performed 06/10/2016 FINDINGS: Lower chest: The visualized lung bases are grossly clear. The visualized portions of the mediastinum are unremarkable. Hepatobiliary: Scattered small hypodensities within the liver measure up to 9 mm in size, nonspecific in appearance. The patient is status post cholecystectomy, with a clip noted at the gallbladder fossa. The common bile duct remains within normal limits status post cholecystectomy. Pancreas: The pancreas is within normal limits. Spleen: The spleen is unremarkable in appearance. Adrenals/Urinary Tract: There is a heterogeneous 2.6 cm left adrenal nodule. Slight nodularity is noted at the right adrenal gland, not well defined. There is incomplete rotation of both kidneys. Bilateral extrarenal pelves are noted, without significant hydronephrosis. No renal or ureteral stones are identified. No perinephric stranding is seen. Stomach/Bowel: The stomach is unremarkable in appearance. The small bowel is within normal limits. The appendix is not  visualized; there is no evidence for appendicitis. The colon is unremarkable in appearance. Vascular/Lymphatic: Mild mural thrombus is noted along the distal abdominal aorta, without significant luminal narrowing. Scattered calcification is seen along the abdominal aorta and its branches. No retroperitoneal or pelvic sidewall lymphadenopathy is seen. Reproductive: The bladder is mildly  distended and grossly unremarkable. The patient is status post hysterectomy. No suspicious adnexal masses are seen. The ovaries are relatively symmetric. Other: No additional soft tissue abnormalities are seen. Musculoskeletal: No acute osseous abnormalities are identified. The visualized musculature is unremarkable in appearance. IMPRESSION: 1. No acute abnormality seen within the abdomen or pelvis. 2. Heterogeneous 2.6 cm left adrenal nodule noted. Would correlate with adrenal labs, and recommend adrenal protocol MRI or CT for further evaluation. 3. Scattered small nonspecific hypodensities within the liver measure up to 9 mm. 4. Mild mural thrombus at the distal abdominal aorta, without significant luminal narrowing. Scattered aortic atherosclerosis. Electronically Signed   By: Garald Balding M.D.   On: 12/15/2016 21:23    Procedures Procedures (including critical care time)  Medications Ordered in ED Medications  sodium chloride 0.9 % bolus 1,000 mL (1,000 mLs Intravenous New Bag/Given 12/15/16 1822)  iopamidol (ISOVUE-300) 61 % injection (100 mLs  Contrast Given 12/15/16 2054)     Initial Impression / Assessment and Plan / ED Course  I have reviewed the triage vital signs and the nursing notes.  Pertinent labs & imaging results that were available during my care of the patient were reviewed by me and considered in my medical decision making (see chart for details).  On repeat exam the patient is awake and alert, in no distress. She remained hemodynamically stable. I reviewed the CT imaging with the patient and  family. No CT evidence for diverticulitis, or other acute new pathology. There is a lesion by her left adrenal gland which will be followed up by her primary care physician. With a generally soft abdomen, though with some discomfort in the lowerleft quadrant, there is suspicion for mild GI illness, with bleeding possibly due to ongoing dual antiplatelet therapy given the patient's substantial cardiac disease. No evidence for hemodynamic instability, or substantial anemia. Patient discharged in stable condition with close outpatient follow-up.  Final Clinical Impressions(s) / ED Diagnoses  Abdominal pain Rectal bleeding Adrenal lesion   Carmin Muskrat, MD 12/15/16 2130

## 2016-12-16 ENCOUNTER — Ambulatory Visit
Admission: RE | Admit: 2016-12-16 | Discharge: 2016-12-16 | Disposition: A | Discharge status: Home / Self Care | Payer: PRIVATE HEALTH INSURANCE | Source: Ambulatory Visit | Attending: Neurology | Admitting: Neurology

## 2016-12-16 ENCOUNTER — Other Ambulatory Visit: Payer: Self-pay

## 2016-12-16 ENCOUNTER — Encounter: Payer: Self-pay | Admitting: Cardiovascular Disease

## 2016-12-16 ENCOUNTER — Ambulatory Visit (INDEPENDENT_AMBULATORY_CARE_PROVIDER_SITE_OTHER): Payer: PRIVATE HEALTH INSURANCE | Admitting: Cardiovascular Disease

## 2016-12-16 VITALS — BP 138/88 | HR 82 | Ht 60.0 in | Wt 121.0 lb

## 2016-12-16 DIAGNOSIS — R29898 Other symptoms and signs involving the musculoskeletal system: Secondary | ICD-10-CM

## 2016-12-16 DIAGNOSIS — I10 Essential (primary) hypertension: Secondary | ICD-10-CM | POA: Diagnosis not present

## 2016-12-16 DIAGNOSIS — R29818 Other symptoms and signs involving the nervous system: Secondary | ICD-10-CM

## 2016-12-16 DIAGNOSIS — E785 Hyperlipidemia, unspecified: Secondary | ICD-10-CM

## 2016-12-16 DIAGNOSIS — Z955 Presence of coronary angioplasty implant and graft: Secondary | ICD-10-CM

## 2016-12-16 DIAGNOSIS — G459 Transient cerebral ischemic attack, unspecified: Secondary | ICD-10-CM

## 2016-12-16 DIAGNOSIS — Z79899 Other long term (current) drug therapy: Secondary | ICD-10-CM | POA: Diagnosis not present

## 2016-12-16 DIAGNOSIS — I25118 Atherosclerotic heart disease of native coronary artery with other forms of angina pectoris: Secondary | ICD-10-CM | POA: Diagnosis not present

## 2016-12-16 DIAGNOSIS — I252 Old myocardial infarction: Secondary | ICD-10-CM

## 2016-12-16 DIAGNOSIS — I5042 Chronic combined systolic (congestive) and diastolic (congestive) heart failure: Secondary | ICD-10-CM

## 2016-12-16 MED ORDER — ATORVASTATIN CALCIUM 40 MG PO TABS: 40.0000 mg | ORAL_TABLET | Freq: Every day | ORAL | 3 refills | Status: DC

## 2016-12-16 MED ORDER — METOPROLOL SUCCINATE ER 25 MG PO TB24: 25.0000 mg | ORAL_TABLET | Freq: Every day | ORAL | 3 refills | Status: DC

## 2016-12-16 MED ORDER — SACUBITRIL-VALSARTAN 24-26 MG PO TABS: 1.0000 | ORAL_TABLET | Freq: Two times a day (BID) | ORAL | 3 refills | Status: DC

## 2016-12-16 NOTE — Progress Notes (Signed)
SUBJECTIVE: The patient presents for routine posthospitalization follow-up. She has a history of coronary artery disease and was hospitalized for atypical chest pain in July. She also has an ischemic cardiopathy. She underwent drug-eluting stent placement to the RCA and PDA 2. She was evaluated in the ED yesterday for bloody stools. Hemoglobin was normal, 12.5. Platelets were 430. Renal function was normal.  Echocardiogram 10/20/16 showed moderately reduced left ventricular systolic function, LVEF 93-79%, grade 1 diastolic dysfunction with elevated filling pressures. There was mild to moderate mitral regurgitation.  She is feeling well today and denies chest pain, palpitations, exertional dyspnea, and leg swelling.  She is uncertain why Lipitor was discontinued in the past.  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   Review of Systems: As per "subjective", otherwise negative.  No Known Allergies  Current Outpatient Prescriptions  Medication Sig Dispense Refill  . acetaminophen (TYLENOL) 500 MG tablet Take 1 tablet (500 mg total) by mouth every 8 (eight) hours as needed for mild pain, moderate pain or headache. 30 tablet 0  . ALPRAZolam (XANAX) 1 MG tablet Take 0.5-1 mg by mouth 2 (two) times daily as needed for anxiety.    [name] Kitchen aspirin EC 81 MG tablet Take 81 mg by mouth daily.    . calcium carbonate (TUMS - DOSED IN MG ELEMENTAL CALCIUM) 500 MG chewable tablet Chew 1 tablet by mouth daily as needed for indigestion or heartburn.    . clopidogrel (PLAVIX) 75 MG tablet Take 1 tablet (75 mg total) by mouth daily. 90 tablet 3  . furosemide (LASIX) 20 MG tablet Take 2 tablets (40 mg total) by mouth daily. 60 tablet 6  . losartan (COZAAR) 25 MG tablet Take 0.5 tablets (12.5 mg total) by mouth every evening. 135 tablet 3  . metoprolol tartrate (LOPRESSOR) 25 MG tablet Take 1 tablet (25 mg total) by mouth 2 (two) times daily. 180 tablet 3  .  nitroGLYCERIN (NITROSTAT) 0.4 MG SL tablet DISSOLVE ONE TABLET UNDER THE TONGUE EVERY 5 MINUTES AS NEEDED FOR CHEST PAIN.  DO NOT EXCEED A TOTAL OF 3 DOSES IN 15 MINUTES 25 tablet 3  . potassium chloride SA (K-DUR,KLOR-CON) 20 MEQ tablet Take 1 tablet (20 mEq total) by mouth daily. 30 tablet 6  . ranolazine (RANEXA) 500 MG 12 hr tablet Take 1 tablet (500 mg total) by mouth 2 (two) times daily. 60 tablet 6   No current facility-administered medications for this visit.     Past Medical History:  Diagnosis Date  . Anxiety state 03/25/2016  . Basal cell carcinoma of forehead   . CHF (congestive heart failure) ([place])   . Coronary artery disease    a. 03/11/16 PCI with DES-->Prox/Mid Cx, 03/14/16 PCI with DES x2-->RCA, EF 30-35%  . Essential hypertension   . GERD (gastroesophageal reflux disease)   . History of pneumonia   . Hyperlipidemia   . Ischemic cardiomyopathy   . Mitral regurgitation   . NSTEMI (non-ST elevated myocardial infarction) ([place]) 03/10/2016  . Squamous cell cancer of skin of nose   . Thrombocytosis ([place]) 03/26/2016  . Wears dentures   . Wears glasses     Past Surgical History:  Procedure Laterality Date  . APPENDECTOMY    . CARDIAC CATHETERIZATION N/A 03/11/2016   Procedure: Left Heart Cath and Coronary Angiography;  Surgeon: Leonie Man, MD;  Location: [facility];  Service: Cardiovascular;  Laterality: N/A;  .  CARDIAC CATHETERIZATION N/A 03/11/2016   Procedure: Coronary Stent Intervention;  Surgeon: Leonie Man, MD;  Location: [facility];  Service: Cardiovascular;  Laterality: N/A;  . CARDIAC CATHETERIZATION N/A 03/14/2016   Procedure: Coronary Stent Intervention;  Surgeon: Peter M Martinique, MD;  Location: [facility];  Service: Cardiovascular;  Laterality: N/A;  . CHOLECYSTECTOMY OPEN  1984  . CORONARY ANGIOPLASTY WITH STENT PLACEMENT  03/14/2016  . FINGER ARTHROPLASTY Left 05/14/2013   Procedure: LEFT THUMB CARPAL METACARPAL ARTHROPLASTY;   Surgeon: Tennis Must, MD;  Location: [facility];  Service: Orthopedics;  Laterality: Left;  . TUBAL LIGATION  1987  . VAGINAL HYSTERECTOMY  2009    Social History   Social History  . Marital status: Married    Spouse name: N/A  . Number of children: N/A  . Years of education: N/A   Occupational History  . Not on file.   Social History Main Topics  . Smoking status: Current Every Day Smoker    Years: 15.00    Types: Cigarettes  . Smokeless tobacco: Never Used     Comment: 2 cigarettes daily   . Alcohol use 3.6 oz/week    6 Cans of beer per week     Comment: occ  . Drug use: Yes    Types: Marijuana, MDMA (Ecstacy)     Comment: former  . Sexual activity: Yes    Birth control/ protection: Surgical   Other Topics Concern  . Not on file   Social History Narrative   Lives with husband in a one story home with a basement.  Has 4 children.  Works as a Quarry manager.  Education: CNA school.      Vitals:   12/16/16 0904  BP: 138/88  Pulse: 82  SpO2: 99%  Weight: 121 lb (54.9 kg)  Height: 5' (1.524 m)    Wt Readings from Last 3 Encounters:  12/16/16 121 lb (54.9 kg)  12/15/16 118 lb (53.5 kg)  12/01/16 118 lb 4 oz (53.6 kg)     PHYSICAL EXAM General: NAD HEENT: Normal. Neck: No JVD, no thyromegaly. Lungs: Clear to auscultation bilaterally with normal respiratory effort. CV: Nondisplaced PMI.  Regular rate and rhythm, normal S1/S2, no S3/S4, no murmur. No pretibial or periankle edema.  No carotid bruit.   Abdomen: Soft, nontender, no distention.  Neurologic: Alert and oriented.  Psych: Normal affect. Skin: Normal. Musculoskeletal: No gross deformities.    ECG: Most recent ECG reviewed.   Labs: Lab Results  Component Value Date/Time   K 3.6 12/15/2016 05:17 PM   BUN 16 12/15/2016 05:17 PM   BUN 12 07/20/2016 01:27 PM   CREATININE 0.88 12/15/2016 05:17 PM   CREATININE 0.73 05/13/2016 10:20 AM   ALT 17 12/15/2016 05:17 PM   TSH 0.944 08/04/2016  02:50 AM   HGB 12.5 12/15/2016 05:17 PM     Lipids: Lab Results  Component Value Date/Time   LDLCALC 32 05/17/2016 05:36 AM   CHOL 107 05/17/2016 05:36 AM   TRIG 168 (H) 05/17/2016 05:36 AM   HDL 41 05/17/2016 05:36 AM       ASSESSMENT AND PLAN:  1. Coronary artery disease: Continue dual antiplatelet therapy. She is on metoprolol and Ranexa. She is not on a statin for unclear reasons. Liver transaminases were normal yesterday. I will start Lipitor 40 mg. Given reduced LVEF of 35-40%, I will switch metoprolol tartrate to Toprol-XL 25 mg daily. 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 I will provide a letter.  2. Hypertension: Goal BP is 130/80. It is mildly elevated. Monitor given medication changes as noted in #1 and 4.  3. Hyperlipidemia: I will start Lipitor 40 mg.  4. Ischemic cardiomyopathy/chronic systolic heart failure: Euvolemic on current diuretic regimen. I will switch metoprolol tartrate to Toprol-XL 25 mg daily. I will discontinue losartan and start Entresto 24/26 mg twice daily tomorrow. Her last dose of losartan was yesterday evening.     Disposition: Follow up 3 months.   Time spent: 40 minutes, of which greater than 50% was spent reviewing symptoms, relevant blood tests and studies, and discussing management plan with the patient.    Kate Sable, M.D., F.A.C.C.

## 2016-12-16 NOTE — Patient Instructions (Addendum)
Medication Instructions:  Stop losartan Stop metoprolol  START TOPROL XL 25 MG ONCE DAILY  START ENTRESTO 24-26 MG TWO TIMES DAILY  (STARTING TOMORROW)  START LIPITOR 40 MG DAILY   Labwork: NONE  Testing/Procedures: NONE  Follow-Up: Your physician recommends that you schedule a follow-up appointment in: 3 MONTHS    Any Other Special Instructions Will Be Listed Below (If Applicable). YOU HAVE BEEN GIVEN A NOTE TO RETURN TO WORK WITHOUT RESTRICTIONS    If you need a refill on your cardiac medications before your next appointment, please call your pharmacy.

## 2016-12-19 ENCOUNTER — Encounter (HOSPITAL_COMMUNITY): Payer: PRIVATE HEALTH INSURANCE

## 2016-12-19 ENCOUNTER — Telehealth: Payer: Self-pay | Admitting: Neurology

## 2016-12-19 NOTE — Telephone Encounter (Signed)
Called patient with the results of her MRI/A head which shows moderate M2 stenosis and left M1 stenosis, and 70mm left PComm aneurysm.  She has noticed more constant numbness of the left leg and occasional spells of the left arm.  She denies any weakness.    Because of her ongoing symptoms and likely symptomatic intracranial stenosis at right M2, I will refer her for cerebral angiogram to better assess the severity of her stenosis.    She is back on lipitor 40mg  and compliant with aspirin 81mg  and plavix 75mg  daily.  Strongly urged her to quit smoking, she is down to 2 cigarettes daily and trying to quit.  Counseled again on stroke warning signs.  Kayston Jodoin K. Posey Pronto, DO

## 2016-12-20 NOTE — Telephone Encounter (Signed)
Called Dr. Arlean Hopping office and left message for Anderson Malta to call me back to schedule appointment.

## 2016-12-26 ENCOUNTER — Encounter (HOSPITAL_COMMUNITY): Payer: PRIVATE HEALTH INSURANCE

## 2016-12-27 ENCOUNTER — Telehealth (HOSPITAL_COMMUNITY): Payer: Self-pay

## 2016-12-27 NOTE — Telephone Encounter (Signed)
Called to schedule, left message for pt to return call. AW 

## 2016-12-28 ENCOUNTER — Other Ambulatory Visit (HOSPITAL_COMMUNITY): Payer: Self-pay | Admitting: Interventional Radiology

## 2016-12-28 DIAGNOSIS — I729 Aneurysm of unspecified site: Secondary | ICD-10-CM

## 2016-12-28 DIAGNOSIS — I771 Stricture of artery: Secondary | ICD-10-CM

## 2016-12-30 ENCOUNTER — Ambulatory Visit (HOSPITAL_COMMUNITY)
Admission: RE | Admit: 2016-12-30 | Discharge: 2016-12-30 | Disposition: A | Discharge status: Home / Self Care | Payer: PRIVATE HEALTH INSURANCE | Source: Ambulatory Visit | Attending: Interventional Radiology | Admitting: Interventional Radiology

## 2016-12-30 DIAGNOSIS — I729 Aneurysm of unspecified site: Secondary | ICD-10-CM

## 2016-12-30 DIAGNOSIS — I771 Stricture of artery: Secondary | ICD-10-CM

## 2016-12-30 HISTORY — PX: IR RADIOLOGIST EVAL & MGMT: IMG5224

## 2017-01-02 ENCOUNTER — Encounter (HOSPITAL_COMMUNITY): Payer: Self-pay | Admitting: Interventional Radiology

## 2017-01-02 ENCOUNTER — Other Ambulatory Visit (HOSPITAL_COMMUNITY): Payer: Self-pay | Admitting: Interventional Radiology

## 2017-01-02 ENCOUNTER — Telehealth (HOSPITAL_COMMUNITY): Payer: Self-pay

## 2017-01-02 DIAGNOSIS — I729 Aneurysm of unspecified site: Secondary | ICD-10-CM

## 2017-01-02 DIAGNOSIS — I771 Stricture of artery: Secondary | ICD-10-CM

## 2017-01-02 NOTE — Telephone Encounter (Signed)
Left message for pt to call back to schedule when she gets off work. AW

## 2017-01-03 ENCOUNTER — Other Ambulatory Visit: Payer: Self-pay | Admitting: Student

## 2017-01-04 ENCOUNTER — Other Ambulatory Visit: Payer: Self-pay | Admitting: Student

## 2017-01-04 ENCOUNTER — Other Ambulatory Visit: Payer: Self-pay | Admitting: Radiology

## 2017-01-05 ENCOUNTER — Ambulatory Visit (HOSPITAL_COMMUNITY)
Admission: RE | Admit: 2017-01-05 | Discharge: 2017-01-05 | Disposition: A | Discharge status: Home / Self Care | Payer: PRIVATE HEALTH INSURANCE | Source: Ambulatory Visit | Attending: Interventional Radiology | Admitting: Interventional Radiology

## 2017-01-05 ENCOUNTER — Other Ambulatory Visit (HOSPITAL_COMMUNITY): Payer: Self-pay | Admitting: Interventional Radiology

## 2017-01-05 ENCOUNTER — Encounter (HOSPITAL_COMMUNITY): Payer: Self-pay

## 2017-01-05 DIAGNOSIS — I252 Old myocardial infarction: Secondary | ICD-10-CM | POA: Insufficient documentation

## 2017-01-05 DIAGNOSIS — I729 Aneurysm of unspecified site: Secondary | ICD-10-CM

## 2017-01-05 DIAGNOSIS — I6601 Occlusion and stenosis of right middle cerebral artery: Secondary | ICD-10-CM | POA: Diagnosis not present

## 2017-01-05 DIAGNOSIS — Z8249 Family history of ischemic heart disease and other diseases of the circulatory system: Secondary | ICD-10-CM | POA: Diagnosis not present

## 2017-01-05 DIAGNOSIS — I509 Heart failure, unspecified: Secondary | ICD-10-CM | POA: Diagnosis not present

## 2017-01-05 DIAGNOSIS — E785 Hyperlipidemia, unspecified: Secondary | ICD-10-CM | POA: Insufficient documentation

## 2017-01-05 DIAGNOSIS — K219 Gastro-esophageal reflux disease without esophagitis: Secondary | ICD-10-CM | POA: Diagnosis not present

## 2017-01-05 DIAGNOSIS — I110 Hypertensive heart disease with heart failure: Secondary | ICD-10-CM | POA: Insufficient documentation

## 2017-01-05 DIAGNOSIS — Z955 Presence of coronary angioplasty implant and graft: Secondary | ICD-10-CM | POA: Insufficient documentation

## 2017-01-05 DIAGNOSIS — I771 Stricture of artery: Secondary | ICD-10-CM

## 2017-01-05 DIAGNOSIS — I6522 Occlusion and stenosis of left carotid artery: Secondary | ICD-10-CM | POA: Diagnosis not present

## 2017-01-05 DIAGNOSIS — I255 Ischemic cardiomyopathy: Secondary | ICD-10-CM | POA: Insufficient documentation

## 2017-01-05 DIAGNOSIS — I6502 Occlusion and stenosis of left vertebral artery: Secondary | ICD-10-CM | POA: Diagnosis not present

## 2017-01-05 DIAGNOSIS — F17210 Nicotine dependence, cigarettes, uncomplicated: Secondary | ICD-10-CM | POA: Insufficient documentation

## 2017-01-05 DIAGNOSIS — I2510 Atherosclerotic heart disease of native coronary artery without angina pectoris: Secondary | ICD-10-CM | POA: Insufficient documentation

## 2017-01-05 DIAGNOSIS — I340 Nonrheumatic mitral (valve) insufficiency: Secondary | ICD-10-CM | POA: Insufficient documentation

## 2017-01-05 DIAGNOSIS — R51 Headache: Secondary | ICD-10-CM | POA: Diagnosis present

## 2017-01-05 HISTORY — PX: IR ANGIO INTRA EXTRACRAN SEL COM CAROTID INNOMINATE BILAT MOD SED: IMG5360

## 2017-01-05 HISTORY — PX: IR ANGIO VERTEBRAL SEL VERTEBRAL BILAT MOD SED: IMG5369

## 2017-01-05 LAB — BASIC METABOLIC PANEL
Anion gap: 8 (ref 5–15)
BUN: 15 mg/dL (ref 6–20)
CO2: 22 mmol/L (ref 22–32)
Calcium: 9.5 mg/dL (ref 8.9–10.3)
Chloride: 108 mmol/L (ref 101–111)
Creatinine, Ser: 0.92 mg/dL (ref 0.44–1.00)
GFR calc Af Amer: >60 mL/min (ref >60)
GFR calc non Af Amer: >60 mL/min (ref >60)
Glucose, Bld: 86 mg/dL (ref 65–99)
Potassium: 3.9 mmol/L (ref 3.5–5.1)
Sodium: 138 mmol/L (ref 135–145)

## 2017-01-05 LAB — CBC
HCT: 36.3 % (ref 36.0–46.0)
Hemoglobin: 11.9 g/dL — ABNORMAL LOW (ref 12.0–15.0)
MCH: 28.3 pg (ref 26.0–34.0)
MCHC: 32.8 g/dL (ref 30.0–36.0)
MCV: 86.2 fL (ref 78.0–100.0)
Platelets: 452 10*3/uL — ABNORMAL HIGH (ref 150–400)
RBC: 4.21 MIL/uL (ref 3.87–5.11)
RDW: 14.6 % (ref 11.5–15.5)
WBC: 7.8 10*3/uL (ref 4.0–10.5)

## 2017-01-05 LAB — APTT: aPTT: 31 seconds (ref 24–36)

## 2017-01-05 LAB — PROTIME-INR
INR: 0.89
Prothrombin Time: 12 seconds (ref 11.4–15.2)

## 2017-01-05 MED ORDER — HEPARIN SODIUM (PORCINE) 1000 UNIT/ML IJ SOLN
INTRAMUSCULAR | Status: AC | PRN
  Administered 2017-01-05: 1000 [IU] via INTRAVENOUS
  Administered 2017-01-05: 500 [IU] via INTRAVENOUS

## 2017-01-05 MED ORDER — IOPAMIDOL (ISOVUE-300) INJECTION 61%
INTRAVENOUS | Status: AC
  Filled 2017-01-05: qty 50

## 2017-01-05 MED ORDER — SODIUM CHLORIDE 0.9 % IV SOLN
INTRAVENOUS | Status: AC | PRN
  Administered 2017-01-05: 250 mL via INTRAVENOUS

## 2017-01-05 MED ORDER — MIDAZOLAM HCL 2 MG/2ML IJ SOLN
INTRAMUSCULAR | Status: AC
  Filled 2017-01-05: qty 2

## 2017-01-05 MED ORDER — HEPARIN SODIUM (PORCINE) 1000 UNIT/ML IJ SOLN
INTRAMUSCULAR | Status: AC
  Filled 2017-01-05: qty 2

## 2017-01-05 MED ORDER — LIDOCAINE HCL (PF) 1 % IJ SOLN
INTRAMUSCULAR | Status: AC | PRN
  Administered 2017-01-05: 15 mL

## 2017-01-05 MED ORDER — LIDOCAINE HCL (PF) 1 % IJ SOLN
INTRAMUSCULAR | Status: AC
  Filled 2017-01-05: qty 30

## 2017-01-05 MED ORDER — FENTANYL CITRATE (PF) 100 MCG/2ML IJ SOLN
INTRAMUSCULAR | Status: AC
  Filled 2017-01-05: qty 2

## 2017-01-05 MED ORDER — MIDAZOLAM HCL 2 MG/2ML IJ SOLN
INTRAMUSCULAR | Status: AC | PRN
  Administered 2017-01-05: 0.5 mg via INTRAVENOUS

## 2017-01-05 MED ORDER — IOPAMIDOL (ISOVUE-300) INJECTION 61%
INTRAVENOUS | Status: AC
  Administered 2017-01-05: 80 mL
  Filled 2017-01-05: qty 150

## 2017-01-05 MED ORDER — FENTANYL CITRATE (PF) 100 MCG/2ML IJ SOLN
INTRAMUSCULAR | Status: AC | PRN
  Administered 2017-01-05: 12.5 ug via INTRAVENOUS

## 2017-01-05 MED ORDER — SODIUM CHLORIDE 0.9 % IV SOLN: INTRAVENOUS | Status: AC

## 2017-01-05 NOTE — H&P (Signed)
Chief Complaint: R MCA stenosis  Referring Physician: Dr. Narda Amber  Supervising Physician: Luanne Bras  Patient Status: [facility] - Out-pt  HPI: Erin [name] is a 56 y.o. female who had an MI in November of 2017.  Since then she has noticed some weakness in her LUE and LLE.  She also admits to HAs and some blurred vision.  She was referred to neurology who ordered an MRA. This revealed stenosis of her R MCA.  She was referred to Dr. Estanislado Pandy for angiogram.  She saw him recently and has been set up for today.    Past Medical History:  Past Medical History:  Diagnosis Date  . Anxiety state 03/25/2016  . Basal cell carcinoma of forehead   . CHF (congestive heart failure) ([place])   . Coronary artery disease    a. 03/11/16 PCI with DES-->Prox/Mid Cx, 03/14/16 PCI with DES x2-->RCA, EF 30-35%  . Essential hypertension   . GERD (gastroesophageal reflux disease)   . History of pneumonia   . Hyperlipidemia   . Ischemic cardiomyopathy   . Mitral regurgitation   . NSTEMI (non-ST elevated myocardial infarction) ([place]) 03/10/2016  . Squamous cell cancer of skin of nose   . Thrombocytosis ([place]) 03/26/2016  . Wears dentures   . Wears glasses     Past Surgical History:  Past Surgical History:  Procedure Laterality Date  . APPENDECTOMY    . CARDIAC CATHETERIZATION N/A 03/11/2016   Procedure: Left Heart Cath and Coronary Angiography;  Surgeon: Leonie Man, MD;  Location: [place] [facility];  Service: Cardiovascular;  Laterality: N/A;  . CARDIAC CATHETERIZATION N/A 03/11/2016   Procedure: Coronary Stent Intervention;  Surgeon: Leonie Man, MD;  Location: [place] [facility];  Service: Cardiovascular;  Laterality: N/A;  . CARDIAC CATHETERIZATION N/A 03/14/2016   Procedure: Coronary Stent Intervention;  Surgeon: Peter M Martinique, MD;  Location: [name] [facility];  Service: Cardiovascular;  Laterality: N/A;  . CHOLECYSTECTOMY OPEN  1984  . CORONARY ANGIOPLASTY WITH STENT PLACEMENT   03/14/2016  . FINGER ARTHROPLASTY Left 05/14/2013   Procedure: LEFT THUMB CARPAL METACARPAL ARTHROPLASTY;  Surgeon: Tennis Must, MD;  Location: [place];  Service: Orthopedics;  Laterality: Left;  . IR RADIOLOGIST EVAL & MGMT  12/30/2016  . TUBAL LIGATION  1987  . VAGINAL HYSTERECTOMY  2009    Family History:  Family History  Problem Relation Age of Onset  . Diabetes Father   . Hypertension Father   . CAD Father   . Stroke Mother   . Hypertension Mother   . Diabetes Mother   . Heart failure Other     Social History:  reports that she has been smoking Cigarettes.  She has smoked for the past 15.00 years. She has never used smokeless tobacco. She reports that she drinks about 3.6 oz of alcohol per week . She reports that she uses drugs, including Marijuana and MDMA (Ecstacy).  Allergies:  Allergies  Allergen Reactions  . Tape Other (See Comments)    PEELS SKIN OFF  (PAPER TAPE IS FINE)    Medications: Medications reviewed in epic  Please HPI for pertinent positives, otherwise complete 10 system ROS negative.  Mallampati Score: MD Evaluation Airway: WNL Heart: WNL Abdomen: WNL Chest/ Lungs: WNL ASA  Classification: 3 Mallampati/Airway Score: Two  Physical Exam: BP 105/77   Pulse 69   Temp 97.9 F (36.6 C) (Oral)   Resp 16   Ht 5' (1.524 m)  Wt 115 lb (52.2 kg)   SpO2 100%   BMI 22.46 kg/m  Body mass index is 22.46 kg/m. General: pleasant, WD, WN white female who is laying in bed in NAD HEENT: head is normocephalic, atraumatic.  Sclera are noninjected.  PERRL.  Ears and nose without any masses or lesions.  Mouth is pink and moist Heart: regular, rate, and rhythm.  Normal s1,s2. No obvious murmurs, gallops, or rubs noted.  Palpable radial pulses bilaterally Lungs: CTAB, no wheezes, rhonchi, or rales noted.  Respiratory effort nonlabored Abd: soft, NT, ND, +BS, no masses, hernias, or organomegaly Psych: A&Ox3 with an appropriate  affect.   Labs: Results for orders placed or performed during the hospital encounter of 01/05/17 (from the past 48 hour(s))  APTT     Status: None   Collection Time: 01/05/17  7:18 AM  Result Value Ref Range   aPTT 31 24 - 36 seconds  Basic metabolic panel     Status: None   Collection Time: 01/05/17  7:18 AM  Result Value Ref Range   Sodium 138 135 - 145 mmol/L   Potassium 3.9 3.5 - 5.1 mmol/L   Chloride 108 101 - 111 mmol/L   CO2 22 22 - 32 mmol/L   Glucose, Bld 86 65 - 99 mg/dL   BUN 15 6 - 20 mg/dL   Creatinine, Ser 0.92 0.44 - 1.00 mg/dL   Calcium 9.5 8.9 - 10.3 mg/dL   GFR calc non Af Amer >60 >60 mL/min   GFR calc Af Amer >60 >60 mL/min    Comment: (NOTE) The eGFR has been calculated using the CKD EPI equation. This calculation has not been validated in all clinical situations. eGFR's persistently <60 mL/min signify possible Chronic Kidney Disease.    Anion gap 8 5 - 15  CBC     Status: Abnormal   Collection Time: 01/05/17  7:18 AM  Result Value Ref Range   WBC 7.8 4.0 - 10.5 K/uL   RBC 4.21 3.87 - 5.11 MIL/uL   Hemoglobin 11.9 (L) 12.0 - 15.0 g/dL   HCT 36.3 36.0 - 46.0 %   MCV 86.2 78.0 - 100.0 fL   MCH 28.3 26.0 - 34.0 pg   MCHC 32.8 30.0 - 36.0 g/dL   RDW 14.6 11.5 - 15.5 %   Platelets 452 (H) 150 - 400 K/uL  Protime-INR     Status: None   Collection Time: 01/05/17  7:18 AM  Result Value Ref Range   Prothrombin Time 12.0 11.4 - 15.2 seconds   INR 0.89     Imaging: No results found.  Assessment/Plan 1. R MCA stenosis with headaches and blurred vision  We will plan to perform a diagnostic cerebral angiogram today to further evaluate this area of stenosis to determine if she will need some type of intervention in the future.  Her labs and vitals have been reviewed.  Risks and benefits discussed with the patient including, but not limited to bleeding, infection, vascular injury or contrast induced renal failure. All of the patient's questions were  answered, patient is agreeable to proceed. Consent signed and in chart.  Thank you for this interesting consult.  I greatly enjoyed meeting [name] and look forward to participating in their care.  A copy of this report was sent to the requesting provider on this date.  Electronically Signed: Henreitta Cea 01/05/2017, 8:31 AM   I spent a total of    25 Minutes in face to face in clinical  consultation, greater than 50% of which was counseling/coordinating care for R MCA stenosis

## 2017-01-05 NOTE — Sedation Documentation (Signed)
ETC02 removed per Dr. Deveshwar  

## 2017-01-05 NOTE — Procedures (Signed)
S/P 4 vessel cerebral arteriogram.Preliminary . RT CFA approach. Findings. .1. Approx 65 % stenosis of RT SCA. 2.Approx 65% stenosis of LT VA at skull base. 3.Approx 60 % stenosis RT MCA inf division prox. 4.Approx 50 % stenosis of LT ICA prox.

## 2017-01-05 NOTE — Sedation Documentation (Signed)
Right groin sheath removed. V-Pad applied 

## 2017-01-05 NOTE — Discharge Instructions (Addendum)
Cerebral Angiogram, Care After °Refer to this sheet in the next few weeks. These instructions provide you with information on caring for yourself after your procedure. Your health care provider may also give you more specific instructions. Your treatment has been planned according to current medical practices, but problems sometimes occur. Call your health care provider if you have any problems or questions after your procedure. °What can I expect after the procedure? °After your procedure, it is typical to have the following: °· Bruising at the catheter insertion site that usually fades within 1-2 weeks. °· Blood collecting in the tissue (hematoma) that may be painful to the touch. It should usually decrease in size and tenderness within 1-2 weeks. °· A mild headache. ° °Follow these instructions at home: °· Take medicines only as directed by your health care provider. °· You may shower 24-48 hours after the procedure or as directed by your health care provider. Remove the bandage (dressing) and gently wash the site with plain soap and water. Pat the area dry with a clean towel. Do not rub the site, because this may cause bleeding. °· Do not take baths, swim, or use a hot tub until your health care provider approves. °· Check your insertion site every day for redness, swelling, or drainage. °· Do not apply powder or lotion to the site. °· Do not lift over 10 lb (4.5 kg) for 5 days after your procedure or as directed by your health care provider. °· Ask your health care provider when it is okay to: °? Return to work or school. °? Resume usual physical activities or sports. °? Resume sexual activity. °· Do not drive home if you are discharged the same day as the procedure. Have someone else drive you. °· You may drive 24 hours after the procedure unless otherwise instructed by your health care provider. °· Do not operate machinery or power tools for 24 hours after the procedure or as directed by your health care  provider. °· If your procedure was done as an outpatient procedure, which means that you went home the same day as your procedure, a responsible adult should be with you for the first 24 hours after you arrive home. °· Keep all follow-up visits as directed by your health care provider. This is important. °Contact a health care provider if: °· You have a fever. °· You have chills. °· You have increased bleeding from the catheter insertion site. Hold pressure on the site. °Get help right away if: °· You have vision changes or loss of vision. °· You have numbness or weakness on one side of your body. °· You have difficulty talking, or you have slurred speech or cannot speak (aphasia). °· You feel confused or have difficulty remembering. °· You have unusual pain at the catheter insertion site. °· You have redness, warmth, or swelling at the catheter insertion site. °· You have drainage (other than a small amount of blood on the dressing) from the catheter insertion site. °· The catheter insertion site is bleeding, and the bleeding does not stop after 30 minutes of holding steady pressure on the site. °These symptoms may represent a serious problem that is an emergency. Do not wait to see if the symptoms will go away. Get medical help right away. Call your local emergency services (911 in [place] Do not drive yourself to the hospital. °This information is not intended to replace advice given to you by your health care provider. Make sure you discuss any questions   you have with your health care provider. Document Released: 09/09/2013 Document Revised: 10/01/2015 Document Reviewed: 05/08/2013 Elsevier Interactive Patient Education  2017 [place]. Moderate Conscious Sedation, Adult, Care After These instructions provide you with information about caring for yourself after your procedure. Your health care provider may also give you more specific instructions. Your treatment has been planned according to current  medical practices, but problems sometimes occur. Call your health care provider if you have any problems or questions after your procedure. What can I expect after the procedure? After your procedure, it is common:  To feel sleepy for several hours.  To feel clumsy and have poor balance for several hours.  To have poor judgment for several hours.  To vomit if you eat too soon.  Follow these instructions at home: For at least 24 hours after the procedure:   Do not: ? Participate in activities where you could fall or become injured. ? Drive. ? Use heavy machinery. ? Drink alcohol. ? Take sleeping pills or medicines that cause drowsiness. ? Make important decisions or sign legal documents. ? Take care of children on your own.  Rest. Eating and drinking  Follow the diet recommended by your health care provider.  If you vomit: ? Drink water, juice, or soup when you can drink without vomiting. ? Make sure you have little or no nausea before eating solid foods. General instructions  Have a responsible adult stay with you until you are awake and alert.  Take over-the-counter and prescription medicines only as told by your health care provider.  If you smoke, do not smoke without supervision.  Keep all follow-up visits as told by your health care provider. This is important. Contact a health care provider if:  You keep feeling nauseous or you keep vomiting.  You feel light-headed.  You develop a rash.  You have a fever. Get help right away if:  You have trouble breathing. This information is not intended to replace advice given to you by your health care provider. Make sure you discuss any questions you have with your health care provider. Document Released: 02/13/2013 Document Revised: 09/28/2015 Document Reviewed: 08/15/2015       Elsevier Interactive Patient Education  2018 [name]. Return to Work _____[name] was treated at our facility.  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  Employee may return to modified work on ______________________. Work activity restrictions Work activities that are not tolerated include: ___Bending ___Prolonged sitting ___Lifting more than ________ lb ___Squatting ___ Prolonged standing ___Climbing ___Reaching ___Pushing and pulling ___ Walking ___Other ______________________ These restrictions are effective until ___MAY RETURN TO WORK WITHOUT RESTRICTIONS 01/10/17. Show this Return to Work statement to your supervisor at work as soon as possible. Your employer should be aware of your condition and can help with the necessary work activity restrictions. If you wish to return to work sooner than the date that is listed above, or if you have further problems that make it difficult for you to return at that time, please call our clinic or your health care provider. _________________________________________ Health Care Provider Name (printed) _________________________________________ Health Care Provider (signature) _________________________________________ Date This information is not intended to replace advice given to you by your health care provider. Make sure you discuss any questions you have with your health care provider. Document Released: 04/25/2005 Document Revised: 04/08/2016 Document Reviewed: 11/22/2013 Elsevier Interactive Patient Education  2018 [place].

## 2017-01-06 ENCOUNTER — Encounter (HOSPITAL_COMMUNITY): Payer: Self-pay | Admitting: Interventional Radiology

## 2017-01-19 ENCOUNTER — Encounter: Payer: Self-pay | Admitting: Endocrinology

## 2017-02-07 ENCOUNTER — Other Ambulatory Visit: Payer: Self-pay | Admitting: Physician Assistant

## 2017-02-09 ENCOUNTER — Emergency Department (HOSPITAL_COMMUNITY)
Admission: EM | Admit: 2017-02-09 | Discharge: 2017-02-09 | Discharge status: Against Medical Advice | Payer: PRIVATE HEALTH INSURANCE | Attending: Emergency Medicine | Admitting: Emergency Medicine

## 2017-02-09 ENCOUNTER — Encounter (HOSPITAL_COMMUNITY): Payer: Self-pay | Admitting: *Deleted

## 2017-02-09 DIAGNOSIS — R1084 Generalized abdominal pain: Secondary | ICD-10-CM | POA: Diagnosis present

## 2017-02-09 LAB — CBC
HCT: 33.9 % — ABNORMAL LOW (ref 36.0–46.0)
Hemoglobin: 11.4 g/dL — ABNORMAL LOW (ref 12.0–15.0)
MCH: 29.2 pg (ref 26.0–34.0)
MCHC: 33.6 g/dL (ref 30.0–36.0)
MCV: 86.9 fL (ref 78.0–100.0)
Platelets: 407 10*3/uL — ABNORMAL HIGH (ref 150–400)
RBC: 3.9 MIL/uL (ref 3.87–5.11)
RDW: 14.9 % (ref 11.5–15.5)
WBC: 8 10*3/uL (ref 4.0–10.5)

## 2017-02-09 LAB — URINALYSIS, ROUTINE W REFLEX MICROSCOPIC
Bilirubin Urine: NEGATIVE
Glucose, UA: NEGATIVE mg/dL
Hgb urine dipstick: NEGATIVE
Ketones, ur: NEGATIVE mg/dL
Nitrite: NEGATIVE
Protein, ur: NEGATIVE mg/dL
Specific Gravity, Urine: 1.009 (ref 1.005–1.030)
pH: 5 (ref 5.0–8.0)

## 2017-02-09 LAB — COMPREHENSIVE METABOLIC PANEL
ALT: 14 U/L (ref 14–54)
AST: 19 U/L (ref 15–41)
Albumin: 4.2 g/dL (ref 3.5–5.0)
Alkaline Phosphatase: 65 U/L (ref 38–126)
Anion gap: 8 (ref 5–15)
BUN: 16 mg/dL (ref 6–20)
CO2: 25 mmol/L (ref 22–32)
Calcium: 9.3 mg/dL (ref 8.9–10.3)
Chloride: 103 mmol/L (ref 101–111)
Creatinine, Ser: 0.88 mg/dL (ref 0.44–1.00)
GFR calc Af Amer: >60 mL/min (ref >60)
GFR calc non Af Amer: >60 mL/min (ref >60)
Glucose, Bld: 105 mg/dL — ABNORMAL HIGH (ref 65–99)
Potassium: 4.2 mmol/L (ref 3.5–5.1)
Sodium: 136 mmol/L (ref 135–145)
Total Bilirubin: 0.8 mg/dL (ref 0.3–1.2)
Total Protein: 7.4 g/dL (ref 6.5–8.1)

## 2017-02-09 LAB — LIPASE, BLOOD: Lipase: 48 U/L (ref 11–51)

## 2017-02-09 NOTE — ED Notes (Signed)
RN Eboni called patient from waiting room once without answer

## 2017-02-09 NOTE — ED Triage Notes (Signed)
Pt comes in with left side flank pain starting a few weeks ago, she describes a fullness in her abdomen as well. NAD noted.

## 2017-02-09 NOTE — ED Notes (Signed)
Pt called from waiting room twice. No answer. Pt not visible in the waiting room.

## 2017-02-09 NOTE — ED Triage Notes (Signed)
Pt has nausea, denies v/d

## 2017-02-22 ENCOUNTER — Ambulatory Visit (INDEPENDENT_AMBULATORY_CARE_PROVIDER_SITE_OTHER): Payer: PRIVATE HEALTH INSURANCE | Admitting: *Deleted

## 2017-02-22 DIAGNOSIS — Z23 Encounter for immunization: Secondary | ICD-10-CM

## 2017-03-08 ENCOUNTER — Ambulatory Visit (INDEPENDENT_AMBULATORY_CARE_PROVIDER_SITE_OTHER): Payer: PRIVATE HEALTH INSURANCE | Admitting: Endocrinology

## 2017-03-08 ENCOUNTER — Encounter: Payer: Self-pay | Admitting: Endocrinology

## 2017-03-08 VITALS — BP 124/86 | HR 73 | Ht 61.0 in | Wt 123.0 lb

## 2017-03-08 DIAGNOSIS — D3502 Benign neoplasm of left adrenal gland: Secondary | ICD-10-CM

## 2017-03-08 MED ORDER — DEXAMETHASONE 1 MG PO TABS: ORAL_TABLET | ORAL | 0 refills | Status: DC

## 2017-03-08 NOTE — Progress Notes (Signed)
Patient ID: Erin [name], female   DOB: 01-07-1961, 56 y.o.   MRN: [id number]          Referring physician:  Chief complaint: Weight gain and weakness  History of Present Illness:  Patient had an abdominal pain evaluation in August and her CT scan showed an abnormal left adrenal nodule The patient also notes that she has had progressive weight gain for the last 2-3 months; weight normally taking previously stable about 115.  This is without any change in her appetite or use of any exogenous oral or injectable steroid medications She also thinks that she has had swelling of her face compared to her usual appearance Patient has noticed that her legs get tired more easily and feels like she has walked a long distance.  Also has some difficulty getting up from a low seat She does notice increased bruising more than usual with her Plavix use Also has had some increasing hair loss for the last 3 months or so  Records from PCP office and also results of imaging studies reviewed She was evaluated by her PCP with plasma free metanephrines on 12/28/16 and the result was normal at 15 A.m. cortisol was high at 19.5 from normal up to 19.4 along with any ACTH level of 3.0, low Aldosterone was normal at 2.5  CT scan report: Adrenals/Urinary Tract: There is a heterogeneous 2.6 cm left adrenal nodule. Slight nodularity is noted at the right adrenal gland, not well defined.  Past Medical History:  Diagnosis Date  . Anxiety state 03/25/2016  . Basal cell carcinoma of forehead   . CHF (congestive heart failure) ([place])   . Coronary artery disease    a. 03/11/16 PCI with DES-->Prox/Mid Cx, 03/14/16 PCI with DES x2-->RCA, EF 30-35%  . Essential hypertension   . GERD (gastroesophageal reflux disease)   . History of pneumonia   . Hyperlipidemia   . Ischemic cardiomyopathy   . Mitral regurgitation   . NSTEMI (non-ST elevated myocardial infarction) ([place]) 03/10/2016  . Squamous cell cancer of skin of nose    . Thrombocytosis ([place]) 03/26/2016  . Wears dentures   . Wears glasses     Past Surgical History:  Procedure Laterality Date  . APPENDECTOMY    . CARDIAC CATHETERIZATION N/A 03/11/2016   Procedure: Left Heart Cath and Coronary Angiography;  Surgeon: Leonie Man, MD;  Location: [facility];  Service: Cardiovascular;  Laterality: N/A;  . CARDIAC CATHETERIZATION N/A 03/11/2016   Procedure: Coronary Stent Intervention;  Surgeon: Leonie Man, MD;  Location: [facility];  Service: Cardiovascular;  Laterality: N/A;  . CARDIAC CATHETERIZATION N/A 03/14/2016   Procedure: Coronary Stent Intervention;  Surgeon: Peter M Martinique, MD;  Location: [facility];  Service: Cardiovascular;  Laterality: N/A;  . CHOLECYSTECTOMY OPEN  1984  . CORONARY ANGIOPLASTY WITH STENT PLACEMENT  03/14/2016  . FINGER ARTHROPLASTY Left 05/14/2013   Procedure: LEFT THUMB CARPAL METACARPAL ARTHROPLASTY;  Surgeon: Tennis Must, MD;  Location: [facility];  Service: Orthopedics;  Laterality: Left;  . IR ANGIO INTRA EXTRACRAN SEL COM CAROTID INNOMINATE BILAT MOD SED  01/05/2017  . IR ANGIO VERTEBRAL SEL VERTEBRAL BILAT MOD SED  01/05/2017  . IR RADIOLOGIST EVAL & MGMT  12/30/2016  . TUBAL LIGATION  1987  . VAGINAL HYSTERECTOMY  2009    Family History  Problem Relation Age of Onset  . Diabetes Father   . Hypertension Father   . CAD Father   .  Stroke Mother   . Hypertension Mother   . Diabetes Mother   . Heart failure Other     Social History:  reports that she has been smoking Cigarettes.  She has smoked for the past 15.00 years. She has never used smokeless tobacco. She reports that she drinks about 3.6 oz of alcohol per week . She reports that she uses drugs, including Marijuana and MDMA (Ecstacy).  Allergies:  Allergies  Allergen Reactions  . Tape Other (See Comments)    PEELS SKIN OFF  (PAPER TAPE IS FINE)    Allergies as of 03/08/2017      Reactions   Tape Other (See  Comments)   PEELS SKIN OFF  (PAPER TAPE IS FINE)      Medication List       Accurate as of 03/08/17  8:29 AM. Always use your most recent med list.          acetaminophen 500 MG tablet Commonly known as:  TYLENOL Take 1 tablet (500 mg total) by mouth every 8 (eight) hours as needed for mild pain, moderate pain or headache.   ALPRAZolam 1 MG tablet Commonly known as:  XANAX Take 0.5-1 mg by mouth at bedtime.   aspirin EC 81 MG tablet Take 81 mg by mouth daily.   atorvastatin 80 MG tablet Commonly known as:  LIPITOR Take 80 mg by mouth daily.   calcium carbonate 500 MG chewable tablet Commonly known as:  TUMS - dosed in mg elemental calcium Chew 500 mg by mouth 2 (two) times daily as needed for indigestion or heartburn.   clopidogrel 75 MG tablet Commonly known as:  PLAVIX Take 1 tablet (75 mg total) by mouth daily.   famotidine 20 MG tablet Commonly known as:  PEPCID Take 20 mg by mouth 2 (two) times daily.   furosemide 20 MG tablet Commonly known as:  LASIX TAKE 2 TABLETS BY MOUTH ONCE DAILY   metoprolol succinate 25 MG 24 hr tablet Commonly known as:  TOPROL XL Take 1 tablet (25 mg total) by mouth daily.   nitroGLYCERIN 0.4 MG SL tablet Commonly known as:  NITROSTAT DISSOLVE ONE TABLET UNDER THE TONGUE EVERY 5 MINUTES AS NEEDED FOR CHEST PAIN.  DO NOT EXCEED A TOTAL OF 3 DOSES IN 15 MINUTES   potassium chloride SA 20 MEQ tablet Commonly known as:  K-DUR,KLOR-CON Take 1 tablet (20 mEq total) by mouth daily.   ranolazine 500 MG 12 hr tablet Commonly known as:  RANEXA Take 1 tablet (500 mg total) by mouth 2 (two) times daily.   sacubitril-valsartan 24-26 MG Commonly known as:  ENTRESTO Take 1 tablet by mouth 2 (two) times daily.       LABS:  No visits with results within 1 Week(s) from this visit.  Latest known visit with results is:  Admission on 02/09/2017, Discharged on 02/09/2017  Component Date Value Ref Range Status  . Lipase 02/09/2017 48   11 - 51 U/L Final  . Sodium 02/09/2017 136  135 - 145 mmol/L Final  . Potassium 02/09/2017 4.2  3.5 - 5.1 mmol/L Final  . Chloride 02/09/2017 103  101 - 111 mmol/L Final  . CO2 02/09/2017 25  22 - 32 mmol/L Final  . Glucose, Bld 02/09/2017 105* 65 - 99 mg/dL Final  . BUN 02/09/2017 16  6 - 20 mg/dL Final  . Creatinine, Ser 02/09/2017 0.88  0.44 - 1.00 mg/dL Final  . Calcium 02/09/2017 9.3  8.9 - 10.3 mg/dL Final  .  Total Protein 02/09/2017 7.4  6.5 - 8.1 g/dL Final  . Albumin 02/09/2017 4.2  3.5 - 5.0 g/dL Final  . AST 02/09/2017 19  15 - 41 U/L Final  . ALT 02/09/2017 14  14 - 54 U/L Final  . Alkaline Phosphatase 02/09/2017 65  38 - 126 U/L Final  . Total Bilirubin 02/09/2017 0.8  0.3 - 1.2 mg/dL Final  . GFR calc non Af Amer 02/09/2017 >60  >60 mL/min Final  . GFR calc Af Amer 02/09/2017 >60  >60 mL/min Final   Comment: (NOTE) The eGFR has been calculated using the CKD EPI equation. This calculation has not been validated in all clinical situations. eGFR's persistently <60 mL/min signify possible Chronic Kidney Disease.   . Anion gap 02/09/2017 8  5 - 15 Final  . WBC 02/09/2017 8.0  4.0 - 10.5 K/uL Final  . RBC 02/09/2017 3.90  3.87 - 5.11 MIL/uL Final  . Hemoglobin 02/09/2017 11.4* 12.0 - 15.0 g/dL Final  . HCT 02/09/2017 33.9* 36.0 - 46.0 % Final  . MCV 02/09/2017 86.9  78.0 - 100.0 fL Final  . MCH 02/09/2017 29.2  26.0 - 34.0 pg Final  . MCHC 02/09/2017 33.6  30.0 - 36.0 g/dL Final  . RDW 02/09/2017 14.9  11.5 - 15.5 % Final  . Platelets 02/09/2017 407* 150 - 400 K/uL Final  . Color, Urine 02/09/2017 YELLOW  YELLOW Final  . APPearance 02/09/2017 CLEAR  CLEAR Final  . Specific Gravity, Urine 02/09/2017 1.009  1.005 - 1.030 Final  . pH 02/09/2017 5.0  5.0 - 8.0 Final  . Glucose, UA 02/09/2017 NEGATIVE  NEGATIVE mg/dL Final  . Hgb urine dipstick 02/09/2017 NEGATIVE  NEGATIVE Final  . Bilirubin Urine 02/09/2017 NEGATIVE  NEGATIVE Final  . Ketones, ur 02/09/2017 NEGATIVE   NEGATIVE mg/dL Final  . Protein, ur 02/09/2017 NEGATIVE  NEGATIVE mg/dL Final  . Nitrite 02/09/2017 NEGATIVE  NEGATIVE Final  . Leukocytes, UA 02/09/2017 LARGE* NEGATIVE Final  . RBC / HPF 02/09/2017 0-5  0 - 5 RBC/hpf Final  . WBC, UA 02/09/2017 0-5  0 - 5 WBC/hpf Final  . Bacteria, UA 02/09/2017 RARE* NONE SEEN Final  . Squamous Epithelial / LPF 02/09/2017 0-5* NONE SEEN Final  . Mucus 02/09/2017 PRESENT   Final  . Hyaline Casts, UA 02/09/2017 PRESENT   Final        Review of Systems  Constitutional: Positive for weight gain.       Has gained about 8-10 pounds  HENT: Positive for headaches.   Eyes: Negative for blurred vision.  Respiratory: Positive for shortness of breath.   Cardiovascular: Negative for chest pain and leg swelling.  Gastrointestinal: Negative for abdominal pain.  Endocrine: Negative for fatigue.  Genitourinary: Negative for frequency.  Musculoskeletal:       Has pain in between her shoulder blades but not lower back  Skin:       Hair loss 3 months, also more easy bruising  Neurological: Positive for weakness.       Her legs get tired more recently for the last 2 months   TSH Normal in 3/18  PHYSICAL EXAM:  BP 124/86   Pulse 73   Ht '5\' 1"'$  (1.549 m)   Wt 123 lb (55.8 kg)   SpO2 98%   BMI 23.24 kg/m   GENERAL:   She has mild generalized swelling of her face Has only minimal increase in supraclavicular fat pads, particularly on the right.  Does have mild buffalo hump  No pallor, clubbing or edema.   Has relatively taut skin on her arms but has mild thinning including on her legs No hirsutism but does have some thinning of the scalp hair No purplish striate seen  Skin:  no rash or pigmentation.  EYES:  Externally normal.  Fundii:   exam not indicated  ENT: Oral mucosa and tongue normal.  THYROID:  Just palpable and smooth on the right side, lipids are not palpable.  HEART:  Normal  S1 and S2; no murmur or click.  CHEST:  Normal shape.   Lungs: Vescicular breath sounds heard equally.  No crepitations/ wheeze.  ABDOMEN:  No distention.  Liver and spleen not palpable.  No other mass or tenderness.  NEUROLOGICAL: .Proximal leg.  Motor power 4/5 Reflexes are bilaterally slightly brisk at biceps  JOINTS:  Normal.   ASSESSMENT:   Patient likely has adrenal Cushing's with her symptoms of weight gain, muscle weakness, hair loss and increase bruising She does have mild cushingoid features on exam with [facility] face and evidence of proximal leg weakness; not showing significant skin changes  Her cortisol level is just above the limit and also has a low ACTH level Although she has a nodule on the left adrenal gland she does possibly have some nodularity on the right side as seen on her CT scan She does have normal levels of aldosterone and metanephrines   Fullness of the right thyroid lobe, previously normal TSH  History of cardiomyopathy and atherosclerotic vascular disease  PLAN:   Dexamethasone suppression test as initial screening for  Hypercortisolism  If her cortisol level is high after 1 mg dexamethasone will proceed with high dose suppression also If she has confirmed Cushing's disease may be a candidate for left adrenalectomy but may also need to do MRI to fully evaluate the right adrenal gland and also consider adrenal venous sampling if the right adrenal gland is abnormal in appearance  Follow-up to be determined after labs are available Check TSH  Consultation note sent to the referring physician  [facility] 03/08/2017, 8:29 AM

## 2017-03-09 ENCOUNTER — Other Ambulatory Visit (HOSPITAL_COMMUNITY)
Admission: RE | Admit: 2017-03-09 | Discharge: 2017-03-09 | Disposition: A | Discharge status: Home / Self Care | Payer: PRIVATE HEALTH INSURANCE | Source: Ambulatory Visit | Attending: Endocrinology | Admitting: Endocrinology

## 2017-03-09 DIAGNOSIS — D3502 Benign neoplasm of left adrenal gland: Secondary | ICD-10-CM | POA: Insufficient documentation

## 2017-03-09 LAB — CORTISOL: Cortisol, Plasma: 1.8 ug/dL

## 2017-03-16 ENCOUNTER — Telehealth: Payer: Self-pay | Admitting: *Deleted

## 2017-03-16 NOTE — Telephone Encounter (Signed)
Called both numbers given by the patient and was not able to leave a message on either one will try again later

## 2017-03-16 NOTE — Telephone Encounter (Signed)
Patient called and states she is inquiring about her lab results . Patient states you can call her cell and leave a detailed message. Her contact number is [phone]. Please advise. Thank you

## 2017-03-16 NOTE — Telephone Encounter (Signed)
Patient is requesting that you call her home ph# [phone] and leave a detailed message re: Lab Results and any further instructions that may be required and if known dates of future testing

## 2017-03-17 ENCOUNTER — Telehealth: Payer: Self-pay | Admitting: Endocrinology

## 2017-03-17 NOTE — Telephone Encounter (Signed)
Patient is calling for lab results call work [phone]

## 2017-03-17 NOTE — Telephone Encounter (Signed)
This has been resolved patient going to come in Monday and pick up a 24 hour urine container

## 2017-03-18 ENCOUNTER — Other Ambulatory Visit: Payer: Self-pay

## 2017-03-18 ENCOUNTER — Emergency Department (HOSPITAL_COMMUNITY): Payer: PRIVATE HEALTH INSURANCE

## 2017-03-18 ENCOUNTER — Observation Stay (HOSPITAL_COMMUNITY)
Admission: EM | Admit: 2017-03-18 | Discharge: 2017-03-19 | Disposition: A | Discharge status: Home / Self Care | Payer: PRIVATE HEALTH INSURANCE | Attending: Internal Medicine | Admitting: Internal Medicine

## 2017-03-18 ENCOUNTER — Encounter (HOSPITAL_COMMUNITY): Payer: Self-pay

## 2017-03-18 DIAGNOSIS — R0789 Other chest pain: Secondary | ICD-10-CM | POA: Diagnosis not present

## 2017-03-18 DIAGNOSIS — E785 Hyperlipidemia, unspecified: Secondary | ICD-10-CM | POA: Diagnosis present

## 2017-03-18 DIAGNOSIS — R079 Chest pain, unspecified: Secondary | ICD-10-CM | POA: Diagnosis present

## 2017-03-18 DIAGNOSIS — I5042 Chronic combined systolic (congestive) and diastolic (congestive) heart failure: Secondary | ICD-10-CM | POA: Diagnosis not present

## 2017-03-18 DIAGNOSIS — E782 Mixed hyperlipidemia: Secondary | ICD-10-CM | POA: Diagnosis present

## 2017-03-18 DIAGNOSIS — Z85828 Personal history of other malignant neoplasm of skin: Secondary | ICD-10-CM | POA: Insufficient documentation

## 2017-03-18 DIAGNOSIS — Z955 Presence of coronary angioplasty implant and graft: Secondary | ICD-10-CM | POA: Insufficient documentation

## 2017-03-18 DIAGNOSIS — I2510 Atherosclerotic heart disease of native coronary artery without angina pectoris: Secondary | ICD-10-CM | POA: Diagnosis present

## 2017-03-18 DIAGNOSIS — F17210 Nicotine dependence, cigarettes, uncomplicated: Secondary | ICD-10-CM | POA: Insufficient documentation

## 2017-03-18 DIAGNOSIS — Z79899 Other long term (current) drug therapy: Secondary | ICD-10-CM | POA: Diagnosis not present

## 2017-03-18 DIAGNOSIS — I110 Hypertensive heart disease with heart failure: Secondary | ICD-10-CM | POA: Insufficient documentation

## 2017-03-18 DIAGNOSIS — I10 Essential (primary) hypertension: Secondary | ICD-10-CM | POA: Diagnosis present

## 2017-03-18 DIAGNOSIS — K219 Gastro-esophageal reflux disease without esophagitis: Secondary | ICD-10-CM | POA: Diagnosis not present

## 2017-03-18 DIAGNOSIS — Z7982 Long term (current) use of aspirin: Secondary | ICD-10-CM | POA: Diagnosis not present

## 2017-03-18 DIAGNOSIS — Z7902 Long term (current) use of antithrombotics/antiplatelets: Secondary | ICD-10-CM | POA: Diagnosis not present

## 2017-03-18 DIAGNOSIS — Z720 Tobacco use: Secondary | ICD-10-CM | POA: Diagnosis present

## 2017-03-18 DIAGNOSIS — Z812 Family history of tobacco abuse and dependence: Secondary | ICD-10-CM | POA: Diagnosis not present

## 2017-03-18 HISTORY — DX: Tobacco use: Z72.0

## 2017-03-18 HISTORY — DX: Unspecified systolic (congestive) heart failure: I50.20

## 2017-03-18 LAB — CBC
HCT: 34.5 % — ABNORMAL LOW (ref 36.0–46.0)
Hemoglobin: 11.7 g/dL — ABNORMAL LOW (ref 12.0–15.0)
MCH: 29.6 pg (ref 26.0–34.0)
MCHC: 33.9 g/dL (ref 30.0–36.0)
MCV: 87.3 fL (ref 78.0–100.0)
Platelets: 402 10*3/uL — ABNORMAL HIGH (ref 150–400)
RBC: 3.95 MIL/uL (ref 3.87–5.11)
RDW: 14.9 % (ref 11.5–15.5)
WBC: 9.2 10*3/uL (ref 4.0–10.5)

## 2017-03-18 NOTE — ED Triage Notes (Signed)
Pt reports having CP (sternal area), pt says she took 2 nitro tablets at home without relief.  Pain started about around 6 o'clock tonight along with dizziness, lightheaded, and nausea. Pt reports "feeling like can't take a deep breath."

## 2017-03-19 ENCOUNTER — Observation Stay (HOSPITAL_COMMUNITY): Payer: PRIVATE HEALTH INSURANCE

## 2017-03-19 ENCOUNTER — Encounter (HOSPITAL_COMMUNITY): Payer: Self-pay | Admitting: Nurse Practitioner

## 2017-03-19 ENCOUNTER — Observation Stay (HOSPITAL_BASED_OUTPATIENT_CLINIC_OR_DEPARTMENT_OTHER): Payer: PRIVATE HEALTH INSURANCE

## 2017-03-19 ENCOUNTER — Other Ambulatory Visit: Payer: Self-pay | Admitting: Nurse Practitioner

## 2017-03-19 DIAGNOSIS — R079 Chest pain, unspecified: Secondary | ICD-10-CM

## 2017-03-19 DIAGNOSIS — I10 Essential (primary) hypertension: Secondary | ICD-10-CM

## 2017-03-19 DIAGNOSIS — E782 Mixed hyperlipidemia: Secondary | ICD-10-CM

## 2017-03-19 DIAGNOSIS — R002 Palpitations: Secondary | ICD-10-CM | POA: Diagnosis not present

## 2017-03-19 DIAGNOSIS — K219 Gastro-esophageal reflux disease without esophagitis: Secondary | ICD-10-CM | POA: Diagnosis not present

## 2017-03-19 DIAGNOSIS — Z720 Tobacco use: Secondary | ICD-10-CM

## 2017-03-19 DIAGNOSIS — F17210 Nicotine dependence, cigarettes, uncomplicated: Secondary | ICD-10-CM | POA: Diagnosis not present

## 2017-03-19 DIAGNOSIS — I25119 Atherosclerotic heart disease of native coronary artery with unspecified angina pectoris: Secondary | ICD-10-CM | POA: Diagnosis not present

## 2017-03-19 DIAGNOSIS — I259 Chronic ischemic heart disease, unspecified: Secondary | ICD-10-CM | POA: Diagnosis not present

## 2017-03-19 DIAGNOSIS — Z7982 Long term (current) use of aspirin: Secondary | ICD-10-CM | POA: Diagnosis not present

## 2017-03-19 DIAGNOSIS — Z79899 Other long term (current) drug therapy: Secondary | ICD-10-CM | POA: Diagnosis not present

## 2017-03-19 DIAGNOSIS — Z85828 Personal history of other malignant neoplasm of skin: Secondary | ICD-10-CM | POA: Diagnosis not present

## 2017-03-19 DIAGNOSIS — R072 Precordial pain: Secondary | ICD-10-CM | POA: Diagnosis not present

## 2017-03-19 DIAGNOSIS — E785 Hyperlipidemia, unspecified: Secondary | ICD-10-CM | POA: Diagnosis not present

## 2017-03-19 DIAGNOSIS — I2510 Atherosclerotic heart disease of native coronary artery without angina pectoris: Secondary | ICD-10-CM | POA: Diagnosis not present

## 2017-03-19 DIAGNOSIS — I200 Unstable angina: Secondary | ICD-10-CM | POA: Diagnosis not present

## 2017-03-19 DIAGNOSIS — Z7902 Long term (current) use of antithrombotics/antiplatelets: Secondary | ICD-10-CM | POA: Diagnosis not present

## 2017-03-19 DIAGNOSIS — I110 Hypertensive heart disease with heart failure: Secondary | ICD-10-CM | POA: Diagnosis not present

## 2017-03-19 DIAGNOSIS — I5042 Chronic combined systolic (congestive) and diastolic (congestive) heart failure: Secondary | ICD-10-CM | POA: Diagnosis not present

## 2017-03-19 DIAGNOSIS — R0789 Other chest pain: Secondary | ICD-10-CM | POA: Diagnosis not present

## 2017-03-19 DIAGNOSIS — Z955 Presence of coronary angioplasty implant and graft: Secondary | ICD-10-CM | POA: Diagnosis not present

## 2017-03-19 DIAGNOSIS — Z812 Family history of tobacco abuse and dependence: Secondary | ICD-10-CM | POA: Diagnosis not present

## 2017-03-19 LAB — LIPID PANEL
Cholesterol: 179 mg/dL (ref 0–200)
HDL: 49 mg/dL (ref >40)
LDL Cholesterol: 81 mg/dL (ref 0–99)
Total CHOL/HDL Ratio: 3.7 RATIO
Triglycerides: 245 mg/dL — ABNORMAL HIGH (ref <150)
VLDL: 49 mg/dL — ABNORMAL HIGH (ref 0–40)

## 2017-03-19 LAB — BASIC METABOLIC PANEL
Anion gap: 10 (ref 5–15)
BUN: 14 mg/dL (ref 6–20)
CO2: 23 mmol/L (ref 22–32)
Calcium: 10 mg/dL (ref 8.9–10.3)
Chloride: 107 mmol/L (ref 101–111)
Creatinine, Ser: 1 mg/dL (ref 0.44–1.00)
GFR calc Af Amer: >60 mL/min (ref >60)
GFR calc non Af Amer: >60 mL/min (ref >60)
Glucose, Bld: 108 mg/dL — ABNORMAL HIGH (ref 65–99)
Potassium: 3.5 mmol/L (ref 3.5–5.1)
Sodium: 140 mmol/L (ref 135–145)

## 2017-03-19 LAB — NM MYOCAR MULTI W/SPECT W/WALL MOTION / EF
Estimated workload: 1 METS
Exercise duration (min): 0 min
Exercise duration (sec): 0 s
MPHR: 166 {beats}/min
Peak HR: 117 {beats}/min
Percent HR: 70 %
Rest HR: 68 {beats}/min

## 2017-03-19 LAB — TROPONIN I
Troponin I: <0.03 ng/mL (ref <0.03)
Troponin I: <0.03 ng/mL (ref <0.03)
Troponin I: <0.03 ng/mL (ref <0.03)

## 2017-03-19 MED ORDER — NITROGLYCERIN 2 % TD OINT
1.0000 [in_us] | TOPICAL_OINTMENT | Freq: Once | TRANSDERMAL | Status: AC
  Administered 2017-03-19: 1 [in_us] via TOPICAL
  Filled 2017-03-19: qty 1

## 2017-03-19 MED ORDER — ASPIRIN EC 325 MG PO TBEC
325.0000 mg | DELAYED_RELEASE_TABLET | Freq: Every day | ORAL | Status: DC
  Administered 2017-03-19: 325 mg via ORAL
  Filled 2017-03-19: qty 1

## 2017-03-19 MED ORDER — REGADENOSON 0.4 MG/5ML IV SOLN
INTRAVENOUS | Status: AC
  Filled 2017-03-19: qty 5

## 2017-03-19 MED ORDER — POTASSIUM CHLORIDE CRYS ER 20 MEQ PO TBCR
20.0000 meq | EXTENDED_RELEASE_TABLET | Freq: Every day | ORAL | Status: DC
  Administered 2017-03-19: 20 meq via ORAL
  Filled 2017-03-19: qty 1

## 2017-03-19 MED ORDER — MORPHINE SULFATE (PF) 2 MG/ML IV SOLN
2.0000 mg | INTRAVENOUS | Status: DC | PRN
Start: 2017-03-19 — End: 2017-03-19
  Filled 2017-03-19: qty 1

## 2017-03-19 MED ORDER — FAMOTIDINE 20 MG PO TABS
20.0000 mg | ORAL_TABLET | Freq: Two times a day (BID) | ORAL | Status: DC
  Administered 2017-03-19: 20 mg via ORAL
  Filled 2017-03-19: qty 1

## 2017-03-19 MED ORDER — REGADENOSON 0.4 MG/5ML IV SOLN
0.4000 mg | Freq: Once | INTRAVENOUS | Status: AC
  Administered 2017-03-19: 0.4 mg via INTRAVENOUS
  Filled 2017-03-19: qty 5

## 2017-03-19 MED ORDER — NITROGLYCERIN 0.4 MG SL SUBL: 0.4000 mg | SUBLINGUAL_TABLET | SUBLINGUAL | Status: DC | PRN

## 2017-03-19 MED ORDER — EZETIMIBE 10 MG PO TABS
10.0000 mg | ORAL_TABLET | Freq: Every day | ORAL | Status: DC
  Administered 2017-03-19: 10 mg via ORAL
  Filled 2017-03-19: qty 1

## 2017-03-19 MED ORDER — TECHNETIUM TC 99M TETROFOSMIN IV KIT
10.0000 | PACK | Freq: Once | INTRAVENOUS | Status: AC | PRN
  Administered 2017-03-19: 10 via INTRAVENOUS

## 2017-03-19 MED ORDER — ASPIRIN EC 81 MG PO TBEC: 81.0000 mg | DELAYED_RELEASE_TABLET | Freq: Every day | ORAL | Status: DC

## 2017-03-19 MED ORDER — ACETAMINOPHEN 325 MG PO TABS: 650.0000 mg | ORAL_TABLET | ORAL | Status: DC | PRN

## 2017-03-19 MED ORDER — METOPROLOL SUCCINATE ER 25 MG PO TB24
25.0000 mg | ORAL_TABLET | Freq: Every day | ORAL | Status: DC
  Filled 2017-03-19 (×3): qty 1

## 2017-03-19 MED ORDER — EZETIMIBE 10 MG PO TABS: 10.0000 mg | ORAL_TABLET | Freq: Every day | ORAL | 3 refills | Status: DC

## 2017-03-19 MED ORDER — ATORVASTATIN CALCIUM 80 MG PO TABS
80.0000 mg | ORAL_TABLET | Freq: Every day | ORAL | Status: DC
  Administered 2017-03-19: 80 mg via ORAL
  Filled 2017-03-19 (×3): qty 1

## 2017-03-19 MED ORDER — TECHNETIUM TC 99M TETROFOSMIN IV KIT
30.0000 | PACK | Freq: Once | INTRAVENOUS | Status: AC | PRN
  Administered 2017-03-19: 30 via INTRAVENOUS

## 2017-03-19 MED ORDER — ONDANSETRON HCL 4 MG/2ML IJ SOLN: 4.0000 mg | Freq: Four times a day (QID) | INTRAMUSCULAR | Status: DC | PRN

## 2017-03-19 MED ORDER — ENOXAPARIN SODIUM 40 MG/0.4ML ~~LOC~~ SOLN
40.0000 mg | SUBCUTANEOUS | Status: DC
  Administered 2017-03-19: 40 mg via SUBCUTANEOUS
  Filled 2017-03-19 (×2): qty 0.4

## 2017-03-19 MED ORDER — ALPRAZOLAM 0.5 MG PO TABS: 0.5000 mg | ORAL_TABLET | Freq: Every day | ORAL | Status: DC

## 2017-03-19 MED ORDER — GI COCKTAIL ~~LOC~~: 30.0000 mL | Freq: Four times a day (QID) | ORAL | Status: DC | PRN

## 2017-03-19 MED ORDER — ASPIRIN 81 MG PO CHEW
324.0000 mg | CHEWABLE_TABLET | Freq: Once | ORAL | Status: AC
  Administered 2017-03-19: 324 mg via ORAL
  Filled 2017-03-19: qty 4

## 2017-03-19 MED ORDER — SACUBITRIL-VALSARTAN 24-26 MG PO TABS
1.0000 | ORAL_TABLET | Freq: Two times a day (BID) | ORAL | Status: DC
  Filled 2017-03-19 (×3): qty 1

## 2017-03-19 MED ORDER — CLOPIDOGREL BISULFATE 75 MG PO TABS
75.0000 mg | ORAL_TABLET | Freq: Every day | ORAL | Status: DC
  Administered 2017-03-19: 75 mg via ORAL
  Filled 2017-03-19: qty 1

## 2017-03-19 MED ORDER — RANOLAZINE ER 500 MG PO TB12
500.0000 mg | ORAL_TABLET | Freq: Two times a day (BID) | ORAL | Status: DC
  Administered 2017-03-19: 500 mg via ORAL
  Filled 2017-03-19 (×4): qty 1

## 2017-03-19 MED ORDER — FUROSEMIDE 40 MG PO TABS
40.0000 mg | ORAL_TABLET | Freq: Every day | ORAL | Status: DC
  Administered 2017-03-19: 40 mg via ORAL
  Filled 2017-03-19: qty 1

## 2017-03-19 NOTE — ED Notes (Signed)
ED Provider at bedside. 

## 2017-03-19 NOTE — Progress Notes (Signed)
   Lanny Cramp presented for a lexiscan cardiolite today.  No immediate complications.  Stress imaging is pending at this time.  Murray Hodgkins, NP 03/19/2017, 11:44 AM

## 2017-03-19 NOTE — Assessment & Plan Note (Signed)
Patient with known CAD and history of stents. At risk for acute coronary syndrome. Plan: Chest pain order set. Aspirin 324 mg x 1 given, then daily aspirin. Nitroglycerin prn. Beta blocker if patient can tolerate given blood pressure and heart rate. ACE/ARB if patient can tolerate given blood pressure. Statin. Measure lipid panel. Telemetry. Troponin q6 hrs x 3. Oxygen support by nasal cannula as needed. Morphine PRN. Stress test ordered for the am. Patient has been advised: if the stress test is negative, follow up with your primary care physician for further evaluation of your chest pain.

## 2017-03-19 NOTE — Assessment & Plan Note (Signed)
Plan: continue home medications. PRN meds also.

## 2017-03-19 NOTE — Progress Notes (Signed)
Discharge instruction was given to pt.  Belongings were sent home with pt.  Suriyah Vergara, RN 

## 2017-03-19 NOTE — Assessment & Plan Note (Signed)
Plan: Continue home beta blocker and Entresto.

## 2017-03-19 NOTE — Discharge Summary (Signed)
Physician Discharge Summary   Patient ID: Erin [name] MRN: [id number] DOB/AGE: June 22, 1960 56 y.o.  Admit date: 03/18/2017 Discharge date: 03/19/2017  Primary Care Physician:  Maurice Small, MD  Discharge Diagnoses:    . atypical Chest pain . Essential hypertension . Mixed hyperlipidemia . Tobacco abuse . CAD (coronary artery disease), native coronary artery . GERD (gastroesophageal reflux disease)   Consults:  cardiology  Recommendations for Outpatient Follow-up:  1. Event monitor will be arranged by cardiology office  2. Please repeat CBC/BMET at next visit 3. Please follow blood/urine cultures till final   DIET: heart healthy     Allergies:   Allergies  Allergen Reactions  . Tape Other (See Comments)    PEELS SKIN OFF  (PAPER TAPE IS FINE)     DISCHARGE MEDICATIONS: Current Discharge Medication List    START taking these medications   Details  ezetimibe (ZETIA) 10 MG tablet Take 1 tablet (10 mg total) daily by mouth. Qty: 30 tablet, Refills: 3      CONTINUE these medications which have NOT CHANGED   Details  acetaminophen (TYLENOL) 500 MG tablet Take 1 tablet (500 mg total) by mouth every 8 (eight) hours as needed for mild pain, moderate pain or headache. Qty: 30 tablet, Refills: 0    ALPRAZolam (XANAX) 1 MG tablet Take 0.5-1 mg by mouth at bedtime.     aspirin EC 81 MG tablet Take 81 mg by mouth daily.    atorvastatin (LIPITOR) 80 MG tablet Take 80 mg by mouth daily.    calcium carbonate (TUMS - DOSED IN MG ELEMENTAL CALCIUM) 500 MG chewable tablet Chew 500 mg by mouth 2 (two) times daily as needed for indigestion or heartburn.     clopidogrel (PLAVIX) 75 MG tablet Take 1 tablet (75 mg total) by mouth daily. Qty: 90 tablet, Refills: 3    famotidine (PEPCID) 20 MG tablet Take 20 mg by mouth 2 (two) times daily.    furosemide (LASIX) 20 MG tablet TAKE 2 TABLETS BY MOUTH ONCE DAILY Qty: 60 tablet, Refills: 6    metoprolol succinate (TOPROL XL)  25 MG 24 hr tablet Take 1 tablet (25 mg total) by mouth daily. Qty: 90 tablet, Refills: 3    nitroGLYCERIN (NITROSTAT) 0.4 MG SL tablet DISSOLVE ONE TABLET UNDER THE TONGUE EVERY 5 MINUTES AS NEEDED FOR CHEST PAIN.  DO NOT EXCEED A TOTAL OF 3 DOSES IN 15 MINUTES Qty: 25 tablet, Refills: 3    potassium chloride SA (K-DUR,KLOR-CON) 20 MEQ tablet Take 1 tablet (20 mEq total) by mouth daily. Qty: 30 tablet, Refills: 6    ranolazine (RANEXA) 500 MG 12 hr tablet Take 1 tablet (500 mg total) by mouth 2 (two) times daily. Qty: 60 tablet, Refills: 6    sacubitril-valsartan (ENTRESTO) 24-26 MG Take 1 tablet by mouth 2 (two) times daily. Qty: 180 tablet, Refills: 3      STOP taking these medications     dexamethasone (DECADRON) 1 MG tablet          Brief H and P: For complete details please refer to admission H and P, but in brief Patient is a 56 year old female with CAD, 3 stents, systolic CHF with EF 71-24%, MR presented with chest pain radiating to the left chest and back.  Patient was admitted for further workup.    Hospital Course:    Chest pain with known underlying history of CAD, stents -Currently no chest pain, troponins x3, no acute ST-T wave changes suggestive of ischemia -  Consulted cardiology.  -Continue aspirin, Plavix, Lipitor, Ranexa, beta-blocker, Entresto -Patient underwent nuclear medicine stress test which showed no ST segment deviation noted during the stress.  Findings were consistent with prior large lateral/inferolateral/inferior/inferoapical infarction with minimal peri-infarct ischemia.  There is small basal septal infarct.  High risk study based on low EF and large scar.  EF 30%. -Patient had a prior echo in June 2018 which had shown EF of 91-69%, grade 1 diastolic dysfunction  - patient was cleared by cardiology service for discharge   Tachypalpitations -Patient also has been complaining of intermittent palpitations in the last week occurring 2-3 times with  diaphoresis and dyspnea. -TSH normal, cardiology recommended 30-day event monitor which they will arrange and patient will be called by the office. -Recommended to continue oral beta-blocker. -BP is softer hence beta-blocker cannot be titrated up at this time.    Essential hypertension -Currently stable, continue Toprol-XL    Mixed hyperlipidemia -Continue Lipitor    GERD (gastroesophageal reflux disease) -Continue PPI    Tobacco abuse -Patient counseled on smoking cessation  History of ischemic cardiomyopathy, chronic systolic CHF -Currently euvolemic -Continue beta-blocker, Entresto     Day of Discharge BP 93/64   Pulse 71   Temp 98.2 F (36.8 C) (Oral)   Resp 20   Ht 5\' 1"  (1.549 m)   Wt 54.7 kg (120 lb 11.2 oz)   SpO2 97%   BMI 22.81 kg/m   Physical Exam: General: Alert and awake oriented x3 not in any acute distress. HEENT: anicteric sclera, pupils reactive to light and accommodation CVS: S1-S2 clear no murmur rubs or gallops Chest: clear to auscultation bilaterally, no wheezing rales or rhonchi Abdomen: soft nontender, nondistended, normal bowel sounds Extremities: no cyanosis, clubbing or edema noted bilaterally Neuro: Cranial nerves II-XII intact, no focal neurological deficits   The results of significant diagnostics from this hospitalization (including imaging, microbiology, ancillary and laboratory) are listed below for reference.    LAB RESULTS: Basic Metabolic Panel: Recent Labs  Lab 03/18/17 2326  NA 140  K 3.5  CL 107  CO2 23  GLUCOSE 108*  BUN 14  CREATININE 1.00  CALCIUM 10.0   Liver Function Tests: No results for input(s): AST, ALT, ALKPHOS, BILITOT, PROT, ALBUMIN in the last 168 hours. No results for input(s): LIPASE, AMYLASE in the last 168 hours. No results for input(s): AMMONIA in the last 168 hours. CBC: Recent Labs  Lab 03/18/17 2326  WBC 9.2  HGB 11.7*  HCT 34.5*  MCV 87.3  PLT 402*   Cardiac Enzymes: Recent  Labs  Lab 03/19/17 0151 03/19/17 0530  TROPONINI <0.03 <0.03   BNP: Invalid input(s): POCBNP CBG: No results for input(s): GLUCAP in the last 168 hours.  Significant Diagnostic Studies:  Dg Chest 2 View  Result Date: 03/19/2017 CLINICAL DATA:  Chest pain EXAM: CHEST  2 VIEW COMPARISON:  11/19/2016 FINDINGS: The heart size and mediastinal contours are within normal limits. Both lungs are clear. The visualized skeletal structures are unremarkable. IMPRESSION: No active cardiopulmonary disease. Electronically Signed   By: Donavan Foil M.D.   On: 03/19/2017 01:06   Nm Myocar Multi W/spect W/wall Motion / Ef  Result Date: 03/19/2017  There was no ST segment deviation noted during stress.  Findings consistent with prior large lateral/inferolateral/inferior/inferoapical infarction with minimal peri-infarct ischemia. There is a small basal septal infarct  This is a high risk study. High risk based on low LVEF and large scar. There is minimal myocardium currently at jeopardy. Consider  correlating LVEF with echo.  Nuclear stress EF: 30%.     2D ECHO:   Disposition and Follow-up: Discharge Instructions    (HEART FAILURE PATIENTS) Call MD:  Anytime you have any of the following symptoms: 1) 3 pound weight gain in 24 hours or 5 pounds in 1 week 2) shortness of breath, with or without a dry hacking cough 3) swelling in the hands, feet or stomach 4) if you have to sleep on extra pillows at night in order to breathe.   Complete by:  As directed    Diet - low sodium heart healthy   Complete by:  As directed    Increase activity slowly   Complete by:  As directed        DISPOSITION: home   [place], Jonathan F, MD. Schedule an appointment as soon as possible for a visit in 1 week(s).   Specialty:  Cardiology Why:  for follow-up. You will be called by office for event monitor.  Contact information: [address] [place]  [address] [phone]        Maurice Small, MD. Schedule an appointment as soon as possible for a visit in 2 week(s).   Specialty:  Family Medicine Contact information: [place] [address] [phone]            Time spent on Discharge: 19mins  Signed:   Estill Cotta M.D. [facility] 03/19/2017, 2:03 PM Pager: [phone]

## 2017-03-19 NOTE — Assessment & Plan Note (Signed)
Counseled to quit 

## 2017-03-19 NOTE — Progress Notes (Signed)
   03/19/17 0640  Vitals  Temp 98.2 F (36.8 C)  Temp Source Oral  BP 118/71  BP Location Right Arm  BP Method Automatic  Patient Position (if appropriate) Lying  Pulse Rate 77  Pulse Rate Source Dinamap  Resp 18  Oxygen Therapy  SpO2 97 %  O2 Device Room Air  Height and Weight  Height 5\' 1"  (1.549 m)  Weight 54.7 kg (120 lb 11.2 oz)  Type of Scale Used Standing  BSA (Calculated - sq m) 1.53 sq meters  BMI (Calculated) 22.82  Weight in (lb) to have BMI = 25 132    Pt. Arrived from [facility]. She is A&Ox4. VSS. Denies chest pain. Pt. oriented to the room, unit, and call bell. CCMD called and verified.Pt. kept NPO. Husband at bedside. Will monitor.

## 2017-03-19 NOTE — Assessment & Plan Note (Signed)
Known CAD, native heart, native artery, with possible angina. Unclear if chest pain is due to cardiac ischemia. Plan: Continue home medications. Chest pain order set.

## 2017-03-19 NOTE — ED Provider Notes (Signed)
[facility] EMERGENCY DEPARTMENT Provider Note   CSN: [id number] Arrival date & time: 03/18/17  2322   Time seen 12:23 AM  History   Chief Complaint Chief Complaint  Patient presents with  . Chest Pain    HPI Erin [name] is a 56 y.o. female.  HPI patient reports about 6 PM she had been driving and she started getting left side burning pressure sensation in her chest.  The pain is been there constantly.  It made her feel short of breath and she got sweaty and had nausea without vomiting.  She states she is never had this pain before.  She states the only thing that has made it feel worse as once when she sneezed.  Laying on her left side made it feel better.  The pain does not radiate she states she took one baby aspirin and did 2 nitroglycerin and the second 1 started to help a little bit.  She does admit to drinking 1 glass of wine tonight.  She states she had a MI last November but she does not remember what happened for about 4 days.  She states she had 3 stents placed in 2017 within a few days of each other.  She states currently her pain is a 8 out of 10 and at its worst was a 10 out of 10 at 6 PM.  She relates her father has a history of congestive heart failure, MI and also stents.  She states she also was very tired when she got up this morning and went back to bed for several hours which is unusual.  Patient states she is taking her Plavix and her Ranexa.  PCP Maurice Small, MD Cardiology Dr [name], last stress test was 3 months ago  Past Medical History:  Diagnosis Date  . Anxiety state 03/25/2016  . Basal cell carcinoma of forehead   . CHF (congestive heart failure) ([place])   . Coronary artery disease    a. 03/11/16 PCI with DES-->Prox/Mid Cx, 03/14/16 PCI with DES x2-->RCA, EF 30-35%  . Essential hypertension   . GERD (gastroesophageal reflux disease)   . History of pneumonia   . Hyperlipidemia   . Ischemic cardiomyopathy   . Mitral regurgitation   . NSTEMI (non-ST  elevated myocardial infarction) ([place]) 03/10/2016  . Squamous cell cancer of skin of nose   . Thrombocytosis ([place]) 03/26/2016  . Wears dentures   . Wears glasses     Patient Active Problem List   Diagnosis Date Noted  . Chronic combined systolic and diastolic CHF (congestive heart failure) ([place]) 06/20/2016  . Hypokalemia   . Acute CHF (congestive heart failure) ([place]) 05/16/2016  . Acute on chronic systolic CHF (congestive heart failure) ([place]) 05/16/2016  . Acute respiratory failure with hypoxia ([place])   . Thrombocytosis ([place]) 03/26/2016  . Cardiomyopathy, ischemic 03/25/2016  . Acute on chronic combined systolic and diastolic congestive heart failure ([place]) 03/25/2016  . Anxiety state 03/25/2016  . Troponin level elevated 03/25/2016  . CAD (coronary artery disease) 03/25/2016  . Normocytic anemia 03/25/2016  . SOB (shortness of breath) 03/24/2016  . Lightheadedness 03/17/2016  . Hypotension 03/17/2016  . Tobacco abuse 03/12/2016  . NSTEMI (non-ST elevated myocardial infarction) ([place]) 03/11/2016  . Pain in the chest   . Essential hypertension 09/06/2015  . Hyperlipidemia 09/06/2015  . GERD (gastroesophageal reflux disease) 09/06/2015  . Chest pain 09/06/2015    Past Surgical History:  Procedure Laterality Date  . APPENDECTOMY    . CHOLECYSTECTOMY OPEN  [place] WITH STENT PLACEMENT  03/14/2016  . IR ANGIO INTRA EXTRACRAN SEL COM CAROTID INNOMINATE BILAT MOD SED  01/05/2017  . IR ANGIO VERTEBRAL SEL VERTEBRAL BILAT MOD SED  01/05/2017  . IR RADIOLOGIST EVAL & MGMT  12/30/2016  . TUBAL LIGATION  1987  . VAGINAL HYSTERECTOMY  2009    OB History    Gravida Para Term Preterm AB Living   4 4 4     4    SAB TAB Ectopic Multiple Live Births                   Home Medications    Prior to Admission medications   Medication Sig Start Date End Date Taking? Authorizing Provider  acetaminophen (TYLENOL) 500 MG tablet Take 1 tablet (500 mg total) by mouth every  8 (eight) hours as needed for mild pain, moderate pain or headache. Patient taking differently: Take 500 mg by mouth daily as needed for mild pain, moderate pain or headache.  03/15/16   Cheryln Manly, NP  ALPRAZolam Duanne Moron) 1 MG tablet Take 0.5-1 mg by mouth at bedtime.     [provider]  aspirin EC 81 MG tablet Take 81 mg by mouth daily.    [provider]  atorvastatin (LIPITOR) 80 MG tablet Take 80 mg by mouth daily.    [provider]  calcium carbonate (TUMS - DOSED IN MG ELEMENTAL CALCIUM) 500 MG chewable tablet Chew 500 mg by mouth 2 (two) times daily as needed for indigestion or heartburn.     [provider]  clopidogrel (PLAVIX) 75 MG tablet Take 1 tablet (75 mg total) by mouth daily. 06/02/16   Lendon Colonel, NP  dexamethasone (DECADRON) 1 MG tablet Take 1 tablet at bedtime the night before lab work 03/08/17   Elayne Snare, MD  famotidine (PEPCID) 20 MG tablet Take 20 mg by mouth 2 (two) times daily.    [provider]  furosemide (LASIX) 20 MG tablet TAKE 2 TABLETS BY MOUTH ONCE DAILY 02/07/17   Herminio Commons, MD  metoprolol succinate (TOPROL XL) 25 MG 24 hr tablet Take 1 tablet (25 mg total) by mouth daily. 12/16/16   Herminio Commons, MD  nitroGLYCERIN (NITROSTAT) 0.4 MG SL tablet DISSOLVE ONE TABLET UNDER THE TONGUE EVERY 5 MINUTES AS NEEDED FOR CHEST PAIN.  DO NOT EXCEED A TOTAL OF 3 DOSES IN 15 MINUTES 09/07/16   Josue Hector, MD  potassium chloride SA (K-DUR,KLOR-CON) 20 MEQ tablet Take 1 tablet (20 mEq total) by mouth daily. 09/07/16   Josue Hector, MD  ranolazine (RANEXA) 500 MG 12 hr tablet Take 1 tablet (500 mg total) by mouth 2 (two) times daily. 08/22/16   Lendon Colonel, NP  sacubitril-valsartan (ENTRESTO) 24-26 MG Take 1 tablet by mouth 2 (two) times daily. 12/16/16   Herminio Commons, MD    Family History Family History  Problem Relation Age of Onset  . Diabetes Father   . Hypertension Father     . CAD Father   . Stroke Mother   . Hypertension Mother   . Diabetes Mother   . Heart failure Other     Social History Social History   Tobacco Use  . Smoking status: Current Every Day Smoker    Years: 15.00    Types: Cigarettes  . Smokeless tobacco: Never Used  . Tobacco comment: 2 cigarettes daily   Substance Use Topics  . Alcohol use: Yes  Alcohol/week: 3.6 oz    Types: 6 Cans of beer per week    Comment: occ  . Drug use: Yes    Types: Marijuana, MDMA (Ecstacy)    Comment: former  smokes 3 cigs a day down from 1 ppd Drinks wine once a week or monthly, had 1 glass tonight Employed as a CNA   Allergies   Tape   Review of Systems Review of Systems  All other systems reviewed and are negative.    Physical Exam Updated Vital Signs BP 119/76   Pulse 74   Resp 20   Ht 5\' 1"  (1.549 m)   Wt 55.8 kg (123 lb)   SpO2 98%   BMI 23.24 kg/m   Vital signs normal    Physical Exam  Constitutional: She is oriented to person, place, and time. She appears well-developed and well-nourished.  Non-toxic appearance. She does not appear ill. No distress.  HENT:  Head: Normocephalic and atraumatic.  Right Ear: External ear normal.  Left Ear: External ear normal.  Nose: Nose normal. No mucosal edema or rhinorrhea.  Mouth/Throat: Oropharynx is clear and moist and mucous membranes are normal. No dental abscesses or uvula swelling.  Eyes: Conjunctivae and EOM are normal. Pupils are equal, round, and reactive to light.  Neck: Normal range of motion and full passive range of motion without pain. Neck supple.  Cardiovascular: Normal rate, regular rhythm and normal heart sounds. Exam reveals no gallop and no friction rub.  No murmur heard. Pulmonary/Chest: Effort normal and breath sounds normal. No respiratory distress. She has no wheezes. She has no rhonchi. She has no rales. She exhibits no tenderness and no crepitus.  Abdominal: Soft. Normal appearance and bowel sounds are  normal. She exhibits no distension. There is no tenderness. There is no rebound and no guarding.  Musculoskeletal: Normal range of motion. She exhibits no edema or tenderness.  Moves all extremities well.   Neurological: She is alert and oriented to person, place, and time. She has normal strength. No cranial nerve deficit.  Skin: Skin is warm, dry and intact. No rash noted. No erythema. No pallor.  Psychiatric: She has a normal mood and affect. Her speech is normal and behavior is normal. Her mood appears not anxious.  Nursing note and vitals reviewed.    ED Treatments / Results  Labs (all labs ordered are listed, but only abnormal results are displayed) Results for orders placed or performed during the hospital encounter of 58/85/02  Basic metabolic panel  Result Value Ref Range   Sodium 140 135 - 145 mmol/L   Potassium 3.5 3.5 - 5.1 mmol/L   Chloride 107 101 - 111 mmol/L   CO2 23 22 - 32 mmol/L   Glucose, Bld 108 (H) 65 - 99 mg/dL   BUN 14 6 - 20 mg/dL   Creatinine, Ser 1.00 0.44 - 1.00 mg/dL   Calcium 10.0 8.9 - 10.3 mg/dL   GFR calc non Af Amer >60 >60 mL/min   GFR calc Af Amer >60 >60 mL/min   Anion gap 10 5 - 15  CBC  Result Value Ref Range   WBC 9.2 4.0 - 10.5 K/uL   RBC 3.95 3.87 - 5.11 MIL/uL   Hemoglobin 11.7 (L) 12.0 - 15.0 g/dL   HCT 34.5 (L) 36.0 - 46.0 %   MCV 87.3 78.0 - 100.0 fL   MCH 29.6 26.0 - 34.0 pg   MCHC 33.9 30.0 - 36.0 g/dL   RDW 14.9 11.5 - 15.5 %  Platelets 402 (H) 150 - 400 K/uL  Troponin I  Result Value Ref Range   Troponin I <0.03 <0.03 ng/mL  Troponin I  Result Value Ref Range   Troponin I <0.03 <0.03 ng/mL   Laboratory interpretation all normal except mild anemia    EKG  EKG Interpretation  Date/Time:  Saturday March 18 2017 23:30:49 EST Ventricular Rate:  91 PR Interval:    QRS Duration: 99 QT Interval:  369 QTC Calculation: 914 R Axis:   4 Text Interpretation:  Sinus rhythm Borderline low voltage, extremity leads No  significant change since last tracing 15 Dec 2016 Confirmed by Rolland Porter [phone]) on 03/19/2017 12:05:14 AM       Radiology Dg Chest 2 View  Result Date: 03/19/2017 CLINICAL DATA:  Chest pain EXAM: CHEST  2 VIEW COMPARISON:  11/19/2016 FINDINGS: The heart size and mediastinal contours are within normal limits. Both lungs are clear. The visualized skeletal structures are unremarkable. IMPRESSION: No active cardiopulmonary disease. Electronically Signed   By: Donavan Foil M.D.   On: 03/19/2017 01:06    Procedures Procedures (including critical care time)  Medications Ordered in ED Medications  aspirin chewable tablet 324 mg (324 mg Oral Given 03/19/17 0100)  nitroGLYCERIN (NITROGLYN) 2 % ointment 1 inch (1 inch Topical Given 03/19/17 0101)     Initial Impression / Assessment and Plan / ED Course  I have reviewed the triage vital signs and the nursing notes.  Pertinent labs & imaging results that were available during my care of the patient were reviewed by me and considered in my medical decision making (see chart for details).    Patient was given full dose aspirin and nitroglycerin paste.  After reviewing her laboratory results delta troponin was ordered.  Recheck it 3 AM patient states her pain is much improved, she states her pain is currently a 4-5 out of 10.  We are still waiting for her delta troponin result.  I am going to talk to the cardiologist and see what they recommend she is agreeable.   Review of her prior notes show she was last seen by her cardiologist on December 16, 2016.  This was a posthospitalization follow-up.  She was admitted in July for atypical chest pain.  Ischemic cardiomyopathy.  She has a stent in her RCA and her PDA x2.  Her echocardiogram on October 20, 2016 showed ejection fraction 35-40% with grade 1 diastolic dysfunction with elevated filling pressures.  There was mild to moderate MR present.  She had moderately reduced left ventricular systolic  function.  She is to follow-up in 3 months.  At that time she also was given a note to return to work.  Patient's last stress test in March showed no ischemic changes.  She had a large inferior lateral scar.  3:59 AM patient was discussed with Dr. Harrell Gave, cardiologist on call.  She recommends admission to the hospitalist service and recommend repeating a nuclear medicine stress test.  Unfortunately nuclear medicine stress testing is not done here until November 12.  04:14 AM Dr Dena Billet, hospitalist, will admit, may need to go to [facility]>   Final Clinical Impressions(s) / ED Diagnoses   Final diagnoses:  Chest pain, unspecified type    Plan admission  Rolland Porter, MD, Barbette Or, MD 03/19/17 [phone]

## 2017-03-19 NOTE — Progress Notes (Signed)
    Nuc study result reviewed with Dr. Domenic Polite:   There was no ST segment deviation noted during stress.  Findings consistent with prior large lateral/inferolateral/inferior/inferoapical infarction with minimal peri-infarct ischemia. There is a small basal septal infarct  This is a high risk study. High risk based on low LVEF and large scar. There is minimal myocardium currently at jeopardy. Consider correlating LVEF with echo.  Nuclear stress EF: 30%.  Pt w/ known prior h/o of CAD and MI with DES to the LCX and RCA in 03/2016.  EF 35-40% by echo 10/2016.  Only minimal peri-infarct ischemia noted.  No plans for further ischemic w/u @ this time.  Erin [name] may be discharged home and I have arranged for a 30 day event monitor through our office in [place] (they will contact her).  She already has f/u on 11/19 w/ S. Bronson Ing, MD.    Murray Hodgkins, NP

## 2017-03-19 NOTE — H&P (Signed)
Erin [name] is an 56 y.o. female.   Chief Complaint: chest pain HPI:   PRIMARY CARE PROVIDER: Dr. Justin Mend with [facility] in [place]: Anselm Jungling at [facility]  HPI: The patient is a 56 yo woman with CAD with 3 stents most recent in 4268, systolic CHF with EF 34-19 %, mitral regurgitation, who presents with acute chest pain.  Onset: yesterday. Duration: intermittent at first then constant.  Location: Left chest.  Radiation: To back.  Character: 8/10 at times. Burning and pressure.   Alleviated by: Nothing. Exacerbated by: Nothing. Associated Symptoms: Nausea. Diaphoresis. Palpitations. Shortness of breath with chest pain episodes. No fever or chills. No cough or wheezing. No vomiting, abdominal pain. Treatments: none at home except usual medications. She reports compliance with her medications.    Past Medical History:  Diagnosis Date  . Anxiety state 03/25/2016  . Basal cell carcinoma of forehead   . CHF (congestive heart failure) ([place])   . Coronary artery disease    a. 03/11/16 PCI with DES-->Prox/Mid Cx, 03/14/16 PCI with DES x2-->RCA, EF 30-35%  . Essential hypertension   . GERD (gastroesophageal reflux disease)   . History of pneumonia   . Hyperlipidemia   . Ischemic cardiomyopathy   . Mitral regurgitation   . NSTEMI (non-ST elevated myocardial infarction) ([place]) 03/10/2016  . Squamous cell cancer of skin of nose   . Thrombocytosis ([place]) 03/26/2016  . Wears dentures   . Wears glasses     Past Surgical History:  Procedure Laterality Date  . APPENDECTOMY    . CHOLECYSTECTOMY OPEN  1984  . CORONARY ANGIOPLASTY WITH STENT PLACEMENT  03/14/2016  . IR ANGIO INTRA EXTRACRAN SEL COM CAROTID INNOMINATE BILAT MOD SED  01/05/2017  . IR ANGIO VERTEBRAL SEL VERTEBRAL BILAT MOD SED  01/05/2017  . IR RADIOLOGIST EVAL & MGMT  12/30/2016  . TUBAL LIGATION  1987  . VAGINAL HYSTERECTOMY  2009    Family History  Problem Relation Age of Onset   . Diabetes Father   . Hypertension Father   . CAD Father   . Stroke Mother   . Hypertension Mother   . Diabetes Mother   . Heart failure Other    Social History:  reports that she has been smoking cigarettes.  She has smoked for the past 15.00 years. she has never used smokeless tobacco. She reports that she drinks about 3.6 oz of alcohol per week. She reports that she uses drugs. Drugs: Marijuana and MDMA (Ecstacy).  Allergies:  Allergies  Allergen Reactions  . Tape Other (See Comments)    PEELS SKIN OFF  (PAPER TAPE IS FINE)   No current facility-administered medications on file prior to encounter.    Current Outpatient Medications on File Prior to Encounter  Medication Sig Dispense Refill  . acetaminophen (TYLENOL) 500 MG tablet Take 1 tablet (500 mg total) by mouth every 8 (eight) hours as needed for mild pain, moderate pain or headache. (Patient taking differently: Take 500 mg by mouth daily as needed for mild pain, moderate pain or headache. ) 30 tablet 0  . ALPRAZolam (XANAX) 1 MG tablet Take 0.5-1 mg by mouth at bedtime.     [name] Kitchen aspirin EC 81 MG tablet Take 81 mg by mouth daily.    [name] Kitchen atorvastatin (LIPITOR) 80 MG tablet Take 80 mg by mouth daily.    . calcium carbonate (TUMS - DOSED IN MG ELEMENTAL CALCIUM) 500 MG chewable tablet Chew 500 mg by mouth 2 (two)  times daily as needed for indigestion or heartburn.     . clopidogrel (PLAVIX) 75 MG tablet Take 1 tablet (75 mg total) by mouth daily. 90 tablet 3  . dexamethasone (DECADRON) 1 MG tablet Take 1 tablet at bedtime the night before lab work 1 tablet 0  . famotidine (PEPCID) 20 MG tablet Take 20 mg by mouth 2 (two) times daily.    . furosemide (LASIX) 20 MG tablet TAKE 2 TABLETS BY MOUTH ONCE DAILY 60 tablet 6  . metoprolol succinate (TOPROL XL) 25 MG 24 hr tablet Take 1 tablet (25 mg total) by mouth daily. 90 tablet 3  . nitroGLYCERIN (NITROSTAT) 0.4 MG SL tablet DISSOLVE ONE TABLET UNDER THE TONGUE EVERY 5 MINUTES AS NEEDED  FOR CHEST PAIN.  DO NOT EXCEED A TOTAL OF 3 DOSES IN 15 MINUTES 25 tablet 3  . potassium chloride SA (K-DUR,KLOR-CON) 20 MEQ tablet Take 1 tablet (20 mEq total) by mouth daily. 30 tablet 6  . ranolazine (RANEXA) 500 MG 12 hr tablet Take 1 tablet (500 mg total) by mouth 2 (two) times daily. 60 tablet 6  . sacubitril-valsartan (ENTRESTO) 24-26 MG Take 1 tablet by mouth 2 (two) times daily. 180 tablet 3      (Not in a hospital admission)  Results for orders placed or performed during the hospital encounter of 03/18/17 (from the past 48 hour(s))  Basic metabolic panel     Status: Abnormal   Collection Time: 03/18/17 11:26 PM  Result Value Ref Range   Sodium 140 135 - 145 mmol/L   Potassium 3.5 3.5 - 5.1 mmol/L   Chloride 107 101 - 111 mmol/L   CO2 23 22 - 32 mmol/L   Glucose, Bld 108 (H) 65 - 99 mg/dL   BUN 14 6 - 20 mg/dL   Creatinine, Ser 1.00 0.44 - 1.00 mg/dL   Calcium 10.0 8.9 - 10.3 mg/dL   GFR calc non Af Amer >60 >60 mL/min   GFR calc Af Amer >60 >60 mL/min    Comment: (NOTE) The eGFR has been calculated using the CKD EPI equation. This calculation has not been validated in all clinical situations. eGFR's persistently <60 mL/min signify possible Chronic Kidney Disease.    Anion gap 10 5 - 15  CBC     Status: Abnormal   Collection Time: 03/18/17 11:26 PM  Result Value Ref Range   WBC 9.2 4.0 - 10.5 K/uL   RBC 3.95 3.87 - 5.11 MIL/uL   Hemoglobin 11.7 (L) 12.0 - 15.0 g/dL   HCT 34.5 (L) 36.0 - 46.0 %   MCV 87.3 78.0 - 100.0 fL   MCH 29.6 26.0 - 34.0 pg   MCHC 33.9 30.0 - 36.0 g/dL   RDW 14.9 11.5 - 15.5 %   Platelets 402 (H) 150 - 400 K/uL  Troponin I     Status: None   Collection Time: 03/18/17 11:26 PM  Result Value Ref Range   Troponin I <0.03 <0.03 ng/mL  Troponin I     Status: None   Collection Time: 03/19/17  1:51 AM  Result Value Ref Range   Troponin I <0.03 <0.03 ng/mL   Dg Chest 2 View  Result Date: 03/19/2017 CLINICAL DATA:  Chest pain EXAM: CHEST  2  VIEW COMPARISON:  11/19/2016 FINDINGS: The heart size and mediastinal contours are within normal limits. Both lungs are clear. The visualized skeletal structures are unremarkable. IMPRESSION: No active cardiopulmonary disease. Electronically Signed   By: Madie Reno.D.  On: 03/19/2017 01:06    Review of Systems  Constitutional: Positive for diaphoresis and malaise/fatigue. Negative for chills and fever.  HENT: Negative for congestion, ear discharge, ear pain, nosebleeds and sore throat.   Eyes: Negative for pain and discharge.  Respiratory: Positive for shortness of breath. Negative for cough, wheezing and stridor.   Cardiovascular: Positive for chest pain and palpitations.  Gastrointestinal: Positive for nausea. Negative for abdominal pain, blood in stool, constipation, diarrhea and vomiting.  Genitourinary: Negative for dysuria and hematuria.  Musculoskeletal: Negative for joint pain and myalgias.  Skin: Negative for itching and rash.  Neurological: Negative for sensory change, focal weakness and headaches.  Endo/Heme/Allergies: Negative for polydipsia. Does not bruise/bleed easily.  Psychiatric/Behavioral: Negative for depression. The patient is not nervous/anxious.     Blood pressure (!) 92/59, pulse 75, resp. rate 20, height 5' 1"  (1.549 m), weight 55.8 kg (123 lb), SpO2 97 %. Physical Exam   Physical Exam: GENERAL: Ill-appearing, well nourished, well-developed.  HEENT: Normocephalic, atraumatic; pupils equal and round. Nares patent, without discharge or bleeding. No oropharyngeal lesions or erythema. Mucous membranes are dry.  NECK: is supple, no masses, trachea midline.  RESPIRATORY: Clear to auscultation bilaterally. Chest wall movements are symmetric. No use of accessory muscles to breathe.  No wheezing, rales, rhonchi. CARDIOVASCULAR: Normal S1, S2. No rubs, or gallops. PMI non-displaced. Carotids: no carotid bruits. No bradycardia or tachycardia. DP pulses 2+ bilaterally.   GI: soft, nontender, non-distended, normal active bowel sounds. No hepatosplenomegaly.  INTEGUMENT: Clean, dry, and intact. No rashes. MUSCULOSKELETAL: Moving all extremities. No cyanosis. No clubbing. Edema: none bilaterally.  NEUROLOGICAL: Cranial nerves 2-12 grossly intact. Reflexes: 2+ bilaterally. Babinski: toes downgoing bilaterally.  Motor 5/5 throughout. Sensory grossly intact to light touch. Intact rapid alternating movements bilaterally. No pronator drift.  PSYCHIATRIC: Fully oriented. Normal and appropriate affect.  LYMPHATIC: No cervical lymphadenopathy. No supraclavicular lymphadenopathy.  Assessment/Plan  Chest pain Patient with known CAD and history of stents. At risk for acute coronary syndrome. Plan: Chest pain order set. Aspirin 324 mg x 1 given, then daily aspirin. Nitroglycerin prn. Beta blocker if patient can tolerate given blood pressure and heart rate. ACE/ARB if patient can tolerate given blood pressure. Statin. Measure lipid panel. Telemetry. Troponin q6 hrs x 3. Oxygen support by nasal cannula as needed. Morphine PRN. Stress test ordered for the am. Patient has been advised: if the stress test is negative, follow up with your primary care physician for further evaluation of your chest pain.   Essential hypertension Plan: Continue home beta blocker and Entresto.  CAD (coronary artery disease), native coronary artery Known CAD, native heart, native artery, with possible angina. Unclear if chest pain is due to cardiac ischemia. Plan: Continue home medications. Chest pain order set.   Mixed hyperlipidemia Plan: Statin. Check lipid panel.   GERD (gastroesophageal reflux disease) Plan: continue home medications. PRN meds also.   Tobacco abuse Counseled to quit.     Tacey Ruiz, MD 03/19/2017, 4:34 AM

## 2017-03-19 NOTE — Assessment & Plan Note (Signed)
Plan: Statin. Check lipid panel.

## 2017-03-19 NOTE — Progress Notes (Signed)
[facility]                                                                              Patient Demographics  Erin [name], is a 56 y.o. female, DOB - 1961-02-15, DPO:[id number]  Admit date - 03/18/2017   Admitting Physician Erin Ruiz, MD  Outpatient Primary MD for the patient is Erin Small, MD  Outpatient specialists:   LOS - 0  days   Medical records reviewed and are as summarized below:    Chief Complaint  Patient presents with  . Chest Pain       Brief summary   Patient is a 56 year old female with CAD, 3 stents, systolic CHF with EF 43-15%, MR presented with chest pain radiating to the left chest and back.  Patient was admitted for further workup.   Assessment & Plan    Principal Problem:   Chest pain with known underlying history of CAD, stents -Currently no chest pain, troponins x3, no acute ST-T wave changes suggestive of ischemia -Consulted cardiology, NPO currently for stress echo which is not done over the weekend, will await further recommendations from cardiology -Continue aspirin, Plavix, Lipitor, Ranexa, beta-blocker, Entresto  Active Problems:   Essential hypertension -Currently stable, continue Toprol-XL    Mixed hyperlipidemia -Continue Lipitor    GERD (gastroesophageal reflux disease) -Continue PPI    Tobacco abuse -Patient counseled on smoking cessation  History of ischemic cardiomyopathy, chronic systolic CHF -Currently euvolemic -Continue beta-blocker, Entresto   Code Status: Full CODE STATUS DVT Prophylaxis:  Lovenox  Family Communication: Discussed in detail with the patient, all imaging results, lab results explained to the patient   Disposition Plan:   Time Spent in minutes  25 minutes  Procedures:    Consultants:   Cardiology  Antimicrobials:      Medications  Scheduled Meds: . ALPRAZolam  0.5-1 mg Oral QHS  . aspirin EC  325 mg Oral Daily  . atorvastatin  80 mg Oral Daily  .  clopidogrel  75 mg Oral Daily  . enoxaparin (LOVENOX) injection  40 mg Subcutaneous Q24H  . famotidine  20 mg Oral BID  . furosemide  40 mg Oral Daily  . metoprolol succinate  25 mg Oral Daily  . potassium chloride SA  20 mEq Oral Daily  . ranolazine  500 mg Oral BID  . sacubitril-valsartan  1 tablet Oral BID   Continuous Infusions: PRN Meds:.acetaminophen, gi cocktail, morphine injection, nitroGLYCERIN, ondansetron (ZOFRAN) IV   Antibiotics   Anti-infectives (From admission, onward)   None        Subjective:   Erin [name] was seen and examined today.  Currently no chest pain or shortness of breath. Patient denies dizziness, abdominal pain, N/V/D/C, new weakness, numbess, tingling. No acute events overnight.    Objective:   Vitals:   03/19/17 0500 03/19/17 0530 03/19/17 0640 03/19/17 0850  BP: 114/76 125/82 118/71 95/62  Pulse: 68 75 77 71  Resp: 20 20 18    Temp:   98.2 F (36.8 C)   TempSrc:   Oral   SpO2: 97% 98% 97%   Weight:   54.7 kg (120 lb 11.2  oz)   Height:   5\' 1"  (1.549 m)    No intake or output data in the 24 hours ending 03/19/17 0931   Wt Readings from Last 3 Encounters:  03/19/17 54.7 kg (120 lb 11.2 oz)  03/08/17 55.8 kg (123 lb)  02/09/17 53.1 kg (117 lb)     Exam  General: Alert and oriented x 3, NAD  Eyes:  HEENT:  Atraumatic, normocephalic  Cardiovascular: S1 S2 auscultated,RRR, no pedal edema laterally.  Respiratory: Clear to auscultation bilaterally, no wheezing, rales or rhonchi  Gastrointestinal: Soft, nontender, nondistended, + bowel sounds  Ext: no pedal edema bilaterally  Neuro: AAOx3, Cr N's II- XII. Strength 5/5 upper and lower extremities bilaterally  Musculoskeletal: No digital cyanosis, clubbing  Skin: No rashes  Psych: Normal affect and demeanor, alert and oriented x3    Data Reviewed:  I have personally reviewed following labs and imaging studies  Micro Results No results found for this or any previous  visit (from the past 240 hour(s)).  Radiology Reports Dg Chest 2 View  Result Date: 03/19/2017 CLINICAL DATA:  Chest pain EXAM: CHEST  2 VIEW COMPARISON:  11/19/2016 FINDINGS: The heart size and mediastinal contours are within normal limits. Both lungs are clear. The visualized skeletal structures are unremarkable. IMPRESSION: No active cardiopulmonary disease. Electronically Signed   By: Erin [name] M.D.   On: 03/19/2017 01:06    Lab Data:  CBC: Recent Labs  Lab 03/18/17 2326  WBC 9.2  HGB 11.7*  HCT 34.5*  MCV 87.3  PLT 326*   Basic Metabolic Panel: Recent Labs  Lab 03/18/17 2326  NA 140  K 3.5  CL 107  CO2 23  GLUCOSE 108*  BUN 14  CREATININE 1.00  CALCIUM 10.0   GFR: Estimated Creatinine Clearance: 48.5 mL/min (by C-G formula based on SCr of 1 mg/dL). Liver Function Tests: No results for input(s): AST, ALT, ALKPHOS, BILITOT, PROT, ALBUMIN in the last 168 hours. No results for input(s): LIPASE, AMYLASE in the last 168 hours. No results for input(s): AMMONIA in the last 168 hours. Coagulation Profile: No results for input(s): INR, PROTIME in the last 168 hours. Cardiac Enzymes: Recent Labs  Lab 03/18/17 2326 03/19/17 0151 03/19/17 0530  TROPONINI <0.03 <0.03 <0.03   BNP (last 3 results) Recent Labs    07/07/16 0900  PROBNP 910*   HbA1C: No results for input(s): HGBA1C in the last 72 hours. CBG: No results for input(s): GLUCAP in the last 168 hours. Lipid Profile: Recent Labs    03/19/17 0643  CHOL 179  HDL 49  LDLCALC 81  TRIG 245*  CHOLHDL 3.7   Thyroid Function Tests: No results for input(s): TSH, T4TOTAL, FREET4, T3FREE, THYROIDAB in the last 72 hours. Anemia Panel: No results for input(s): VITAMINB12, FOLATE, FERRITIN, TIBC, IRON, RETICCTPCT in the last 72 hours. Urine analysis:    Component Value Date/Time   COLORURINE YELLOW 02/09/2017 1204   APPEARANCEUR CLEAR 02/09/2017 1204   LABSPEC 1.009 02/09/2017 1204   PHURINE 5.0  02/09/2017 1204   GLUCOSEU NEGATIVE 02/09/2017 1204   HGBUR NEGATIVE 02/09/2017 1204   BILIRUBINUR NEGATIVE 02/09/2017 Erin [name] 02/09/2017 1204   PROTEINUR NEGATIVE 02/09/2017 1204   NITRITE NEGATIVE 02/09/2017 1204   LEUKOCYTESUR LARGE (A) 02/09/2017 1204     Erin [name] M.D. [facility] 03/19/2017, 9:31 AM  Pager: [phone] Between 7am to 7pm - call Pager - [phone]  After 7pm go to [email or web address] - password [email or web address]  Call night coverage  person covering after 7pm

## 2017-03-19 NOTE — Consult Note (Signed)
Cardiology Consult    Patient ID: Erin [name] MRN: [id number], DOB/AGE: 09/24/1960   Admit date: 03/18/2017 Date of Consult: 03/19/2017  Primary Physician: Maurice Small, MD Primary Cardiologist: Court Joy, MD Requesting Provider: Tyler Pita  Patient Profile    Erin [name] is a 56 y.o. female with a history of CAD s/p LCX and RCA DES in 03/2016, HFrEF, ICM w/ EF of 35-40%, HTN, HL, and tob abuse, who is being seen today for the evaluation of palpitations and chest pain at the request of Dr. Tana Coast.  Past Medical History   Past Medical History:  Diagnosis Date  . Anxiety state 03/25/2016  . Basal cell carcinoma of forehead   . Coronary artery disease    a. 03/11/16 PCI with DES-->Prox/Mid Cx;  b. 03/14/16 PCI with DES x2-->RCA, EF 30-35%.  . Essential hypertension   . GERD (gastroesophageal reflux disease)   . HFrEF (heart failure with reduced ejection fraction) ([place])    a. 10/2016 Echo: EF 35-40%, Gr1 DD, mild focal basal septal hypertrophy, basal inflat, mid inflat, basal antlat AK. Mid infept/inf/antlat, apical lateral sev HK. Mod MR. mildly reduced RV fxn. Mild TR.  [name] Kitchen History of pneumonia   . Hyperlipidemia   . Ischemic cardiomyopathy    a. 10/2016 Echo: EF 35-40%, Gr1 DD.  [name] Kitchen Mitral regurgitation   . NSTEMI (non-ST elevated myocardial infarction) ([place]) 03/10/2016  . Squamous cell cancer of skin of nose   . Thrombocytosis ([place]) 03/26/2016  . Tobacco abuse   . Wears dentures   . Wears glasses     Past Surgical History:  Procedure Laterality Date  . APPENDECTOMY    . CHOLECYSTECTOMY OPEN  1984  . CORONARY ANGIOPLASTY WITH STENT PLACEMENT  03/14/2016  . IR ANGIO INTRA EXTRACRAN SEL COM CAROTID INNOMINATE BILAT MOD SED  01/05/2017  . IR ANGIO VERTEBRAL SEL VERTEBRAL BILAT MOD SED  01/05/2017  . IR RADIOLOGIST EVAL & MGMT  12/30/2016  . TUBAL LIGATION  1987  . VAGINAL HYSTERECTOMY  2009     Allergies  Allergies  Allergen Reactions  . Tape Other (See Comments)   PEELS SKIN OFF  (PAPER TAPE IS FINE)    History of Present Illness    56 year old female with the above complex past medical history including coronary artery disease status post non-STEMI in November 2017 with stage PCI and drug-eluting stent placement to the left circumflex and right coronary artery.  EF was found to be 30-35% at that time and this improved to 35-40% by echocardiography in June 2018.  Other history includes hypertension, hyperlipidemia, HFrEF, ischemic cardiomyopathy, and ongoing tobacco abuse.  In August of this year, she was admitted with bloody stools with normal H&H.  CT of the abdomen and pelvis incidentally showed a 2.6 cm left adrenal nodule.  She has been followed by primary care and endocrinology.  Plasma free metanephrines were normal in August.  Cortisol level/dexamethasone suppression test was normal on November 1 and thus it was not felt that there was excessive adrenal gland function.  Recommendation was made for 24-hour urine.  Over the past 3 months, patient has been experiencing intermittent tachypalpitations occurring 2-3 times per week, both at rest and with exertion, associated with mild dyspnea and diaphoresis, lasting up to 1 hour, and resolving spontaneously.  She has not been having any exertional chest discomfort or limitations in activities.  She had not been experiencing chest discomfort during episodes of palpitations.  On the evening of November 10 however, she  had recurrent tachypalpitations this time associated with moderate chest pressure, diaphoresis, and dyspnea.  Symptoms lasted longer than usual she presented to [facility].  Prior to arrival, palpitations spontaneously abated though she continued to have chest pressure.  ECG was nonacute.  Initial troponin normal.  She was transferred to [facility] for further evaluation.  She has not had any recurrence of chest pressure and no events on telemetry.  Inpatient Medications    . ALPRAZolam   0.5-1 mg Oral QHS  . aspirin EC  325 mg Oral Daily  . atorvastatin  80 mg Oral Daily  . clopidogrel  75 mg Oral Daily  . enoxaparin (LOVENOX) injection  40 mg Subcutaneous Q24H  . famotidine  20 mg Oral BID  . furosemide  40 mg Oral Daily  . metoprolol succinate  25 mg Oral Daily  . potassium chloride SA  20 mEq Oral Daily  . ranolazine  500 mg Oral BID  . regadenoson  0.4 mg Intravenous Once  . sacubitril-valsartan  1 tablet Oral BID    Family History    Family History  Problem Relation Age of Onset  . Diabetes Father   . Hypertension Father   . CAD Father   . Stroke Mother   . Hypertension Mother   . Diabetes Mother   . Heart failure Other     Social History    Social History   Socioeconomic History  . Marital status: Married    Spouse name: Not on file  . Number of children: Not on file  . Years of education: Not on file  . Highest education level: Not on file  Social Needs  . Financial resource strain: Not on file  . Food insecurity - worry: Not on file  . Food insecurity - inability: Not on file  . Transportation needs - medical: Not on file  . Transportation needs - non-medical: Not on file  Occupational History  . Not on file  Tobacco Use  . Smoking status: Current Every Day Smoker    Years: 15.00    Types: Cigarettes  . Smokeless tobacco: Never Used  . Tobacco comment: 3 cigarettes daily   Substance and Sexual Activity  . Alcohol use: Yes    Alcohol/week: 3.6 oz    Types: 6 Cans of beer per week    Comment: occ  . Drug use: Yes    Types: Marijuana, MDMA (Ecstacy)    Comment: former  . Sexual activity: Yes    Birth control/protection: Surgical  Other Topics Concern  . Not on file  Social History Narrative   Lives with husband in [place] in a one story home with a basement.  Has 4 children.  Works as a Quarry manager.  Education: [employer] school.      Review of Systems    General:  No chills, fever, night sweats or weight changes.  Cardiovascular:  +++ chest  pain/pressure, +++ dyspnea in setting of palpitations, no edema, orthopnea, palpitations, paroxysmal nocturnal dyspnea. Dermatological: No rash, lesions/masses Respiratory: No cough, +++ dyspnea Urologic: No hematuria, dysuria Abdominal:   No nausea, vomiting, diarrhea, bright red blood per rectum, melena, or hematemesis Neurologic:  No visual changes, wkns, changes in mental status. All other systems reviewed and are otherwise negative except as noted above.  Physical Exam    Blood pressure 95/62, pulse 71, temperature 98.2 F (36.8 C), temperature source Oral, resp. rate 18, height 5\' 1"  (1.549 m), weight 120 lb 11.2 oz (54.7 kg), SpO2 97 %.  General: Pleasant, NAD Psych: Normal affect. Neuro: Alert and oriented X 3. Moves all extremities spontaneously. HEENT: Normal  Neck: Supple without bruits or JVD. Lungs:  Resp regular and unlabored, CTA. Heart: RRR no s3, s4, or murmurs. Abdomen: Soft, non-tender, non-distended, BS + x 4.  Extremities: No clubbing, cyanosis or edema. DP/PT/Radials 2+ and equal bilaterally.  Labs     Recent Labs    03/18/17 2326 03/19/17 0151 03/19/17 0530  TROPONINI <0.03 <0.03 <0.03   Lab Results  Component Value Date   WBC 9.2 03/18/2017   HGB 11.7 (L) 03/18/2017   HCT 34.5 (L) 03/18/2017   MCV 87.3 03/18/2017   PLT 402 (H) 03/18/2017    Recent Labs  Lab 03/18/17 2326  NA 140  K 3.5  CL 107  CO2 23  BUN 14  CREATININE 1.00  CALCIUM 10.0  GLUCOSE 108*   Lab Results  Component Value Date   CHOL 179 03/19/2017   HDL 49 03/19/2017   LDLCALC 81 03/19/2017   TRIG 245 (H) 03/19/2017   Lab Results  Component Value Date   DDIMER 0.25 07/07/2016     Radiology Studies    Dg Chest 2 View  Result Date: 03/19/2017 CLINICAL DATA:  Chest pain EXAM: CHEST  2 VIEW COMPARISON:  11/19/2016 FINDINGS: The heart size and mediastinal contours are within normal limits. Both lungs are clear. The visualized skeletal structures are unremarkable.  IMPRESSION: No active cardiopulmonary disease. Electronically Signed   By: Donavan Foil M.D.   On: 03/19/2017 01:06    ECG & Cardiac Imaging    Regular sinus rhythm, 91, no acute ST or T changes.  Assessment & Plan    1.  Tachypalpitations: Over the past 3 months, patient has been experiencing intermittent tachypalpitations occurring 2-3 times per week associated with diaphoresis and dyspnea, lasting up to 1 hour, and resolving spontaneously.  She had been told that this may be related to recent finding of adrenal adenoma though lab work has not shown excessive adrenal activity.  She presented to [facility] last night due to recurrent palpitations this time associated with chest pressure.  Palpitations resolved prior to presentation.  No events on telemetry.  Electrolytes stable.  TSH was normal earlier this year and I will repeat.  We will workup chest pain as outlined below but she will require 30-day event monitor to document tachycardia if she does not have any recurrence while hospitalized.  Continue oral beta-blocker therapy.  Unfortunately, blood pressure is soft in the setting of ongoing low-dose Entresto therapy and so there may not be room to titrate this.  Would prefer to keep her on Entresto in the setting of ischemic cardiomyopathy.  2.  Unstable angina/coronary artery disease: Status post drug-eluting stent placement to the right coronary artery and left circumflex in the setting of a non-STEMI November 2017.  She had been doing well without any chest discomfort until last night, when she developed chest pressure in the setting of palpitations.  Chest pressure persisted after palpitations subsided.  Currently pain-free.  Enzymes negative.  ECG nonacute.  Plan on stress testing this morning to rule out ischemia.  Continue aspirin (reduced dose to 81 mg), statin, Plavix, beta-blocker, and Ranexa.  3.  Essential hypertension: Blood pressure soft.  Follow-up.  4.  Ischemic  cardiomyopathy/HFrEF: Euvolemic on exam.  Continue beta-blocker and Entresto.  5.  Tobacco abuse: Cessation advised.  Next  6.  Hyperlipidemia: Continue high potency statin therapy.  LDL 81.  Add  Zetia to reach goal of less than 70.  Signed, Murray Hodgkins, NP 03/19/2017, 10:40 AM  For questions or updates, please contact   Please consult [email or web address] for contact info under Cardiology/STEMI.  Attending note:   Patient seen and examined. Reviewed records and discussed the case with Mr. Sharolyn Douglas NP. Patient presents with known history of CAD status post NSTEMI in November of last year requiring DES intervention to the circumflex and RCA. She also has cardiomyopathy with LVEF 35-40% range. At present she is undergoing workup for an incidentally noted left adrenal nodule. She had normal free metanephrines and also a normal cortisol level with dexamethasone suppression test. Further evaluation is pending. In this setting, she has been experiencing intermittent rapid palpitations, sudden onset, sometimes last for up to an hour. She feels short of breath and diaphoretic with these episodes. Recently, she has experienced chest discomfort.  On examination she appears comfortable, denies active chest pain. Telemetry shows sinus rhythm which I personally reviewed. Lungs are clear without labored breathing and cardiac exam reveals RRR without gallop.  Lab work shows normal troponin I levels, creatinine 1.0, LDL 81, hemoglobin 11.7, platelets 402. I personally reviewed her ECG which shows sinus rhythm and no acute ST segment changes.  Chest x-ray reports no acute cardiopulmonary process.  Patient presents with recurring palpitations as noted above, more recently onset of chest discomfort. No evidence of ACS based on cardiac enzymes and ECG shows no acute ST segment changes. Plan is to undergo Lexiscan Myoview today for ischemic evaluation. If this is low risk, would be candidate for discharge and further  outpatient testing to include a cardiac monitor to investigate for any possible paroxysmal arrhythmias.  Satira Sark, M.D., F.A.C.C.

## 2017-03-20 ENCOUNTER — Other Ambulatory Visit: Payer: PRIVATE HEALTH INSURANCE

## 2017-03-22 ENCOUNTER — Other Ambulatory Visit: Payer: PRIVATE HEALTH INSURANCE

## 2017-03-22 ENCOUNTER — Other Ambulatory Visit: Payer: Self-pay | Admitting: Endocrinology

## 2017-03-22 DIAGNOSIS — D3502 Benign neoplasm of left adrenal gland: Secondary | ICD-10-CM

## 2017-03-24 ENCOUNTER — Ambulatory Visit (INDEPENDENT_AMBULATORY_CARE_PROVIDER_SITE_OTHER): Payer: PRIVATE HEALTH INSURANCE

## 2017-03-24 DIAGNOSIS — R002 Palpitations: Secondary | ICD-10-CM

## 2017-03-26 LAB — CREATU+CORT U
Cortisol (Ur), Free: 4 ug/24 hr (ref 0–50)
Cortisol,F,ug/L,U: 4 ug/L
Creatinine, 24H Ur: 603 mg/24 hr — ABNORMAL LOW (ref 800–1800)
Creatinine, Urine: 58.5 mg/dL

## 2017-03-26 LAB — SPECIMEN STATUS REPORT

## 2017-03-27 ENCOUNTER — Encounter: Payer: Self-pay | Admitting: Cardiovascular Disease

## 2017-03-27 ENCOUNTER — Ambulatory Visit (INDEPENDENT_AMBULATORY_CARE_PROVIDER_SITE_OTHER): Payer: PRIVATE HEALTH INSURANCE | Admitting: Cardiovascular Disease

## 2017-03-27 VITALS — BP 108/74 | HR 82 | Ht 60.0 in | Wt 124.0 lb

## 2017-03-27 DIAGNOSIS — I252 Old myocardial infarction: Secondary | ICD-10-CM | POA: Diagnosis not present

## 2017-03-27 DIAGNOSIS — Z9289 Personal history of other medical treatment: Secondary | ICD-10-CM

## 2017-03-27 DIAGNOSIS — I25118 Atherosclerotic heart disease of native coronary artery with other forms of angina pectoris: Secondary | ICD-10-CM | POA: Diagnosis not present

## 2017-03-27 DIAGNOSIS — I10 Essential (primary) hypertension: Secondary | ICD-10-CM

## 2017-03-27 DIAGNOSIS — Z79899 Other long term (current) drug therapy: Secondary | ICD-10-CM

## 2017-03-27 DIAGNOSIS — R002 Palpitations: Secondary | ICD-10-CM | POA: Diagnosis not present

## 2017-03-27 DIAGNOSIS — Z955 Presence of coronary angioplasty implant and graft: Secondary | ICD-10-CM | POA: Diagnosis not present

## 2017-03-27 DIAGNOSIS — I5042 Chronic combined systolic (congestive) and diastolic (congestive) heart failure: Secondary | ICD-10-CM | POA: Diagnosis not present

## 2017-03-27 DIAGNOSIS — E785 Hyperlipidemia, unspecified: Secondary | ICD-10-CM

## 2017-03-27 NOTE — Progress Notes (Signed)
SUBJECTIVE:  The patient presents for routine posthospitalization follow-up. She was hospitalized for palpitations associated with diaphoresis and dyspnea.  Telemetry did not demonstrate any arrhythmias.   She has a history of coronary artery disease and ischemic cardiomyopathy, with drug-eluting stent placement to the RCA and PDA 2 in November 2017.  Echocardiogram 10/20/16 showed moderately reduced left ventricular systolic function, LVEF 12-45%, grade 1 diastolic dysfunction with elevated filling pressures. There was mild to moderate mitral regurgitation.  She underwent a nuclear stress test on 03/19/17 which demonstrated large lateral/inferolateral/inferior/inferoapical infarction with minimal peri-infarct ischemia.  He was deemed a high risk study based on low LVEF and large scar.  LVEF was 30%.  At the time of discharge on 03/19/17, a 30-day event monitor was ordered.  K was 3.5 (low normal).  She had been out of her Xanax for 2 days prior to experiencing symptoms and feels it was all related to anxiety.  Lipids 03/19/17: TC 179, LDL 81, HDL 49, TG 245.   She was started on Zetia.  She does not eat fried foods, fast food, or drink sodas or caffeine any longer.  She had 1 episode of palpitations yesterday lasting 2 seconds.  She said she feels better with Entresto.      Review of Systems: As per "subjective", otherwise negative.  Allergies  Allergen Reactions  . Tape Other (See Comments)    PEELS SKIN OFF  (PAPER TAPE IS FINE)    Current Outpatient Medications  Medication Sig Dispense Refill  . acetaminophen (TYLENOL) 500 MG tablet Take 1 tablet (500 mg total) by mouth every 8 (eight) hours as needed for mild pain, moderate pain or headache. (Patient taking differently: Take 500 mg by mouth daily as needed for mild pain, moderate pain or headache. ) 30 tablet 0  . ALPRAZolam (XANAX) 1 MG tablet Take 0.5-1 mg by mouth at bedtime.     [name] Kitchen aspirin EC 81 MG tablet  Take 81 mg by mouth daily.    [name] Kitchen atorvastatin (LIPITOR) 80 MG tablet Take 80 mg by mouth daily.    . calcium carbonate (TUMS - DOSED IN MG ELEMENTAL CALCIUM) 500 MG chewable tablet Chew 500 mg by mouth 2 (two) times daily as needed for indigestion or heartburn.     . clopidogrel (PLAVIX) 75 MG tablet Take 1 tablet (75 mg total) by mouth daily. 90 tablet 3  . ezetimibe (ZETIA) 10 MG tablet Take 1 tablet (10 mg total) daily by mouth. 30 tablet 3  . famotidine (PEPCID) 20 MG tablet Take 20 mg by mouth 2 (two) times daily.    . furosemide (LASIX) 20 MG tablet TAKE 2 TABLETS BY MOUTH ONCE DAILY 60 tablet 6  . metoprolol succinate (TOPROL XL) 25 MG 24 hr tablet Take 1 tablet (25 mg total) by mouth daily. 90 tablet 3  . nitroGLYCERIN (NITROSTAT) 0.4 MG SL tablet DISSOLVE ONE TABLET UNDER THE TONGUE EVERY 5 MINUTES AS NEEDED FOR CHEST PAIN.  DO NOT EXCEED A TOTAL OF 3 DOSES IN 15 MINUTES 25 tablet 3  . potassium chloride SA (K-DUR,KLOR-CON) 20 MEQ tablet Take 1 tablet (20 mEq total) by mouth daily. 30 tablet 6  . ranolazine (RANEXA) 500 MG 12 hr tablet Take 1 tablet (500 mg total) by mouth 2 (two) times daily. 60 tablet 6  . sacubitril-valsartan (ENTRESTO) 24-26 MG Take 1 tablet by mouth 2 (two) times daily. 180 tablet 3   No current facility-administered medications for this visit.  Past Medical History:  Diagnosis Date  . Anxiety state 03/25/2016  . Basal cell carcinoma of forehead   . Coronary artery disease    a. 03/11/16 PCI with DES-->Prox/Mid Cx;  b. 03/14/16 PCI with DES x2-->RCA, EF 30-35%.  . Essential hypertension   . GERD (gastroesophageal reflux disease)   . HFrEF (heart failure with reduced ejection fraction) ([place])    a. 10/2016 Echo: EF 35-40%, Gr1 DD, mild focal basal septal hypertrophy, basal inflat, mid inflat, basal antlat AK. Mid infept/inf/antlat, apical lateral sev HK. Mod MR. mildly reduced RV fxn. Mild TR.  [name] Kitchen History of pneumonia   . Hyperlipidemia   . Ischemic  cardiomyopathy    a. 10/2016 Echo: EF 35-40%, Gr1 DD.  [name] Kitchen Mitral regurgitation   . NSTEMI (non-ST elevated myocardial infarction) ([place]) 03/10/2016  . Squamous cell cancer of skin of nose   . Thrombocytosis ([place]) 03/26/2016  . Tobacco abuse   . Wears dentures   . Wears glasses     Past Surgical History:  Procedure Laterality Date  . APPENDECTOMY    . CHOLECYSTECTOMY OPEN  1984  . CORONARY ANGIOPLASTY WITH STENT PLACEMENT  03/14/2016  . Coronary Stent Intervention N/A 03/14/2016   Performed by Martinique, Peter M, MD at [facility]  . Coronary Stent Intervention N/A 03/11/2016   Performed by Leonie Man, MD at [facility]  . IR ANGIO INTRA EXTRACRAN SEL COM CAROTID INNOMINATE BILAT MOD SED  01/05/2017  . IR ANGIO VERTEBRAL SEL VERTEBRAL BILAT MOD SED  01/05/2017  . IR RADIOLOGIST EVAL & MGMT  12/30/2016  . Left Heart Cath and Coronary Angiography N/A 03/11/2016   Performed by Leonie Man, MD at [facility]  . LEFT THUMB CARPAL METACARPAL ARTHROPLASTY Left 05/14/2013   Performed by Leanora Cover, MD at [facility]  . TUBAL LIGATION  1987  . VAGINAL HYSTERECTOMY  2009    Social History   Socioeconomic History  . Marital status: Married    Spouse name: Not on file  . Number of children: Not on file  . Years of education: Not on file  . Highest education level: Not on file  Social Needs  . Financial resource strain: Not on file  . Food insecurity - worry: Not on file  . Food insecurity - inability: Not on file  . Transportation needs - medical: Not on file  . Transportation needs - non-medical: Not on file  Occupational History  . Not on file  Tobacco Use  . Smoking status: Current Every Day Smoker    Years: 15.00    Types: Cigarettes  . Smokeless tobacco: Never Used  . Tobacco comment: 3 cigarettes daily   Substance and Sexual Activity  . Alcohol use: Yes    Alcohol/week: 3.6 oz    Types: 6 Cans of beer per week    Comment: occ  .  Drug use: Yes    Types: Marijuana, MDMA (Ecstacy)    Comment: former  . Sexual activity: Yes    Birth control/protection: Surgical  Other Topics Concern  . Not on file  Social History Narrative   Lives with husband in [place] in a one story home with a basement.  Has 4 children.  Works as a Quarry manager.  Education: [employer] school.      Vitals:   03/27/17 0903  BP: 108/74  Pulse: 82  SpO2: 98%  Weight: 124 lb (56.2 kg)  Height: 5' (1.524 m)  Wt Readings from Last 3 Encounters:  03/27/17 124 lb (56.2 kg)  03/19/17 120 lb 11.2 oz (54.7 kg)  03/08/17 123 lb (55.8 kg)     PHYSICAL EXAM General: NAD HEENT: Normal. Neck: No JVD, no thyromegaly. Lungs: Clear to auscultation bilaterally with normal respiratory effort. CV: Regular rate and rhythm, normal S1/S2, no S3/S4, no murmur. No pretibial or periankle edema.  No carotid bruit.   Abdomen: Soft, nontender, no distention.  Neurologic: Alert and oriented.  Psych: Normal affect. Skin: Normal. Musculoskeletal: No gross deformities.    ECG: Most recent ECG reviewed.   Labs: Lab Results  Component Value Date/Time   K 3.5 03/18/2017 11:26 PM   BUN 14 03/18/2017 11:26 PM   BUN 12 07/20/2016 01:27 PM   CREATININE 1.00 03/18/2017 11:26 PM   CREATININE 0.73 05/13/2016 10:20 AM   ALT 14 02/09/2017 12:13 PM   TSH 0.944 08/04/2016 02:50 AM   HGB 11.7 (L) 03/18/2017 11:26 PM     Lipids: Lab Results  Component Value Date/Time   LDLCALC 81 03/19/2017 06:43 AM   CHOL 179 03/19/2017 06:43 AM   TRIG 245 (H) 03/19/2017 06:43 AM   HDL 49 03/19/2017 06:43 AM       ASSESSMENT AND PLAN:  1. Coronary artery disease: Symptomatically stable. Stress test reviewed above. Continue dual antiplatelet therapy for the time being. She is on metoprolol succinate, Lipitor, and Ranexa.  2. Hypertension: Controlled. No changes.  3. Hyperlipidemia: Continue Lipitor 80 mg and Zetia. Lipids reviewed above.  4. Ischemic cardiomyopathy/chronic  systolic heart failure: Euvolemic on Toprol-XL and Entresto. Will repeat echocardiogram in several months and determine ICD candidacy.   5.  Tachycardia/palpitations: A 30-day event monitor has been ordered.  Continue Toprol-XL.   Disposition: Follow up 3 months  Time spent: 40 minutes, of which greater than 50% was spent reviewing symptoms, relevant blood tests and studies, and discussing management plan with the patient.   Kate Sable, M.D., F.A.C.C.

## 2017-03-27 NOTE — Patient Instructions (Signed)
Your physician recommends that you schedule a follow-up appointment in: 3 months with Dr.Koneswaran    Your physician recommends that you continue on your current medications as directed. Please refer to the Current Medication list given to you today.   If you need a refill on your cardiac medications before your next appointment, please call your pharmacy.   No lab work or tests ordered today.     Thank you for choosing [facility] !         

## 2017-04-06 NOTE — Telephone Encounter (Signed)
Patient is calling concerning lab results

## 2017-04-06 NOTE — Telephone Encounter (Signed)
[phone] please call pt back with lab results

## 2017-04-06 NOTE — Telephone Encounter (Signed)
Called patient cell phone number and was not able to leave a voice message. Called home phone and left a voice message to call us back and leave a number where she can be reached so we can go over her lab results.

## 2017-04-07 ENCOUNTER — Other Ambulatory Visit: Payer: Self-pay | Admitting: Adult Health

## 2017-04-07 NOTE — Telephone Encounter (Signed)
She can come in to discuss these issues but she does not need any surgery for the adrenal tumor.  We need to figure out where she is getting access cortisone from outside medication sources, it is making her own cortisone look low

## 2017-04-07 NOTE — Telephone Encounter (Signed)
Called patient and gave her the lab results. She is asking what she needs to do about her low cortisol? She is also asking about what is going to be done about the tumor? What is the next step?  Please advise.

## 2017-04-07 NOTE — Telephone Encounter (Signed)
Patient is returning your call for her lab results  Please call this number [phone]

## 2017-04-10 ENCOUNTER — Other Ambulatory Visit: Payer: Self-pay | Admitting: Cardiovascular Disease

## 2017-04-10 NOTE — Telephone Encounter (Signed)
Next available appointment

## 2017-04-10 NOTE — Telephone Encounter (Signed)
Called patient and gave her the message. She stated that she does not know where she is getting any extra cortisone. 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  When is she supposed to come back in to see you?

## 2017-04-10 NOTE — Telephone Encounter (Signed)
Called patient and left a voice message to let her know to call back so I can go over message from Dr. Dwyane Dee.

## 2017-04-11 ENCOUNTER — Telehealth: Payer: Self-pay | Admitting: Cardiovascular Disease

## 2017-04-11 NOTE — Telephone Encounter (Signed)
Patient has been taking Ranexa but copay has went up to $73. She can no afford this. Is there another medication or any way to get help with this medicine. / tg

## 2017-04-11 NOTE — Telephone Encounter (Signed)
Called [facility] in [place] and asked to them to rerun Ranexa on Rx discount card. Informed patient that mediatation is now ready for $5.

## 2017-04-11 NOTE — Telephone Encounter (Signed)
Please schedule patient for the next available appointment per Dr. Dwyane Dee.  Thank you!

## 2017-04-13 ENCOUNTER — Encounter: Payer: Self-pay | Admitting: Endocrinology

## 2017-04-13 NOTE — Telephone Encounter (Signed)
Called the pt, no answer and no voicemail set up. Mailed letter

## 2017-04-25 ENCOUNTER — Encounter (INDEPENDENT_AMBULATORY_CARE_PROVIDER_SITE_OTHER): Payer: Self-pay

## 2017-04-25 ENCOUNTER — Encounter: Payer: Self-pay | Admitting: Endocrinology

## 2017-04-25 ENCOUNTER — Ambulatory Visit (INDEPENDENT_AMBULATORY_CARE_PROVIDER_SITE_OTHER): Payer: PRIVATE HEALTH INSURANCE | Admitting: Endocrinology

## 2017-04-25 VITALS — BP 126/82 | HR 80 | Ht 60.0 in | Wt 124.0 lb

## 2017-04-25 DIAGNOSIS — D3502 Benign neoplasm of left adrenal gland: Secondary | ICD-10-CM

## 2017-04-25 LAB — BASIC METABOLIC PANEL
BUN: 16 mg/dL (ref 6–23)
CO2: 26 mEq/L (ref 19–32)
Calcium: 9.6 mg/dL (ref 8.4–10.5)
Chloride: 106 mEq/L (ref 96–112)
Creatinine, Ser: 0.99 mg/dL (ref 0.40–1.20)
GFR: 61.89 mL/min (ref >60.00)
Glucose, Bld: 86 mg/dL (ref 70–99)
Potassium: 4.8 mEq/L (ref 3.5–5.1)
Sodium: 139 mEq/L (ref 135–145)

## 2017-04-25 LAB — CORTISOL: Cortisol, Plasma: 9.1 ug/dL

## 2017-04-25 NOTE — Progress Notes (Signed)
Patient ID: Erin [name], female   DOB: [birth year], 56 y.o.   MRN: [id number]          Referring physician: Maurice Small  Chief complaint: Weight gain and weakness  History of Present Illness:  History obtained on initial consultation: Patient had an abdominal pain evaluation in August and her CT scan showed an abnormal left adrenal nodule The patient also notes that she has had progressive weight gain for the last 2-3 months; weight normally taking previously stable about 115.  This is without any change in her appetite or use of any exogenous oral or injectable steroid medications She also thinks that she has had swelling of her face compared to her usual appearance  Patient has noticed that her legs get tired more easily and feels like she has walked a long distance.  Also has some difficulty getting up from a low seat She does notice increased bruising more than usual with her Plavix use Also has had some increasing hair loss for the last 3 months or so  Records from PCP office and also results of imaging studies reviewed She was evaluated by her PCP with plasma free metanephrines on 12/28/16 and the result was normal at 15 A.m. cortisol was high at 19.5 from normal up to 19.4 along with an ACTH level of 3.0, low Aldosterone was normal at 2.5  CT scan report: Adrenals/Urinary Tract: There is a heterogeneous 2.6 cm left adrenal nodule. Slight nodularity is noted at the right adrenal gland, not well defined.  RECENT history:  Although she was looking cushingoid on her initial exam with 1 mg dexamethasone her cortisol level went down to 1.8 She was evaluated with a 24 urine free cortisol which was relatively low at only 4.0 although her creatinine indicated possibly less than adequate collection; patient said that she did follow instructions for the collection properly  Despite further questioning patient said that she has not had any exposure to steroids either topical, injectable or  oral in the last few months  Although she thinks she is still feeling some weakness and some tingling in her legs she has not had any further weight gain She does not think her hair loss is much better but she can get up and down better now, she continues to have some bruising Also complaining of fatigue but also some insomnia  Wt Readings from Last 3 Encounters:  04/25/17 124 lb (56.2 kg)  03/27/17 124 lb (56.2 kg)  03/19/17 120 lb 11.2 oz (54.7 kg)    Past Medical History:  Diagnosis Date  . Anxiety state 03/25/2016  . Basal cell carcinoma of forehead   . Coronary artery disease    a. 03/11/16 PCI with DES-->Prox/Mid Cx;  b. 03/14/16 PCI with DES x2-->RCA, EF 30-35%.  . Essential hypertension   . GERD (gastroesophageal reflux disease)   . HFrEF (heart failure with reduced ejection fraction) ([place])    a. 10/2016 Echo: EF 35-40%, Gr1 DD, mild focal basal septal hypertrophy, basal inflat, mid inflat, basal antlat AK. Mid infept/inf/antlat, apical lateral sev HK. Mod MR. mildly reduced RV fxn. Mild TR.  [name] Kitchen History of pneumonia   . Hyperlipidemia   . Ischemic cardiomyopathy    a. 10/2016 Echo: EF 35-40%, Gr1 DD.  [name] Kitchen Mitral regurgitation   . NSTEMI (non-ST elevated myocardial infarction) ([place]) 03/10/2016  . Squamous cell cancer of skin of nose   . Thrombocytosis ([place]) 03/26/2016  . Tobacco abuse   . Wears dentures   .  Wears glasses     Past Surgical History:  Procedure Laterality Date  . APPENDECTOMY    . CARDIAC CATHETERIZATION N/A 03/11/2016   Procedure: Left Heart Cath and Coronary Angiography;  Surgeon: Leonie Man, MD;  Location: [facility];  Service: Cardiovascular;  Laterality: N/A;  . CARDIAC CATHETERIZATION N/A 03/11/2016   Procedure: Coronary Stent Intervention;  Surgeon: Leonie Man, MD;  Location: [facility];  Service: Cardiovascular;  Laterality: N/A;  . CARDIAC CATHETERIZATION N/A 03/14/2016   Procedure: Coronary Stent Intervention;  Surgeon: Peter M  Martinique, MD;  Location: [facility];  Service: Cardiovascular;  Laterality: N/A;  . CHOLECYSTECTOMY OPEN  1984  . CORONARY ANGIOPLASTY WITH STENT PLACEMENT  03/14/2016  . FINGER ARTHROPLASTY Left 05/14/2013   Procedure: LEFT THUMB CARPAL METACARPAL ARTHROPLASTY;  Surgeon: Tennis Must, MD;  Location: [facility];  Service: Orthopedics;  Laterality: Left;  . IR ANGIO INTRA EXTRACRAN SEL COM CAROTID INNOMINATE BILAT MOD SED  01/05/2017  . IR ANGIO VERTEBRAL SEL VERTEBRAL BILAT MOD SED  01/05/2017  . IR RADIOLOGIST EVAL & MGMT  12/30/2016  . TUBAL LIGATION  1987  . VAGINAL HYSTERECTOMY  2009    Family History  Problem Relation Age of Onset  . Diabetes Father   . Hypertension Father   . CAD Father   . Stroke Mother   . Hypertension Mother   . Diabetes Mother   . Heart failure Other     Social History:  reports that she has been smoking cigarettes.  She has smoked for the past 15.00 years. she has never used smokeless tobacco. She reports that she drinks about 3.6 oz of alcohol per week. She reports that she uses drugs. Drugs: Marijuana and MDMA (Ecstacy).  Allergies:  Allergies  Allergen Reactions  . Tape Other (See Comments)    PEELS SKIN OFF  (PAPER TAPE IS FINE)    Allergies as of 04/25/2017      Reactions   Tape Other (See Comments)   PEELS SKIN OFF  (PAPER TAPE IS FINE)      Medication List        Accurate as of 04/25/17  9:42 AM. Always use your most recent med list.          acetaminophen 500 MG tablet Commonly known as:  TYLENOL Take 1 tablet (500 mg total) by mouth every 8 (eight) hours as needed for mild pain, moderate pain or headache.   ALPRAZolam 1 MG tablet Commonly known as:  XANAX Take 0.5-1 mg by mouth at bedtime.   aspirin EC 81 MG tablet Take 81 mg by mouth daily.   atorvastatin 80 MG tablet Commonly known as:  LIPITOR Take 80 mg by mouth daily.   calcium carbonate 500 MG chewable tablet Commonly known as:  TUMS - dosed in  mg elemental calcium Chew 500 mg by mouth 2 (two) times daily as needed for indigestion or heartburn.   clopidogrel 75 MG tablet Commonly known as:  PLAVIX Take 1 tablet (75 mg total) by mouth daily.   ezetimibe 10 MG tablet Commonly known as:  ZETIA Take 1 tablet (10 mg total) daily by mouth.   famotidine 20 MG tablet Commonly known as:  PEPCID Take 20 mg by mouth 2 (two) times daily.   furosemide 20 MG tablet Commonly known as:  LASIX TAKE 2 TABLETS BY MOUTH ONCE DAILY   KLOR-CON M20 20 MEQ tablet Generic drug:  potassium chloride SA TAKE  1 TABLET BY MOUTH ONCE DAILY   metoprolol succinate 25 MG 24 hr tablet Commonly known as:  TOPROL XL Take 1 tablet (25 mg total) by mouth daily.   nitroGLYCERIN 0.4 MG SL tablet Commonly known as:  NITROSTAT DISSOLVE ONE TABLET UNDER THE TONGUE EVERY 5 MINUTES AS NEEDED FOR CHEST PAIN.  DO NOT EXCEED A TOTAL OF 3 DOSES IN 15 MINUTES   RANEXA 500 MG 12 hr tablet Generic drug:  ranolazine TAKE 1 TABLET BY MOUTH TWICE DAILY   sacubitril-valsartan 24-26 MG Commonly known as:  ENTRESTO Take 1 tablet by mouth 2 (two) times daily.       LABS:  No visits with results within 1 Week(s) from this visit.  Latest known visit with results is:  Orders Only on 03/22/2017  Component Date Value Ref Range Status  . Creatinine, Urine 03/22/2017 58.5  Not Estab. mg/dL Final  . Creatinine, 24H Ur 03/22/2017 603* 800 - 1,800 mg/24 hr Final  . Cortisol,F,ug/L,U 03/22/2017 4  Undefined ug/L Final  . Cortisol (Ur), Free 03/22/2017 4  0 - 50 ug/24 hr Final   Comment: This test was developed and its performance characteristics determined by [facility]. It has not been cleared or approved by the Food and Drug Administration.   [name] Kitchen specimen status report 03/22/2017 Comment   Final   Comment: Please note Please note The date and/or time of collection was not indicated on the requisition as required by state and federal law.  The date of receipt of the  specimen was used as the collection date if not supplied.         Review of Systems  Constitutional: Positive for weight gain.       Has gained about 8-10 pounds  HENT: Positive for headaches.   Eyes: Negative for blurred vision.  Respiratory: Positive for shortness of breath.   Cardiovascular: Negative for chest pain and leg swelling.  Gastrointestinal: Negative for abdominal pain.  Endocrine: Negative for fatigue.  Genitourinary: Negative for frequency.  Musculoskeletal:       Has pain in between her shoulder blades but not lower back  Skin:       Hair loss 3 months, also more easy bruising  Neurological: Positive for weakness.       Her legs get tired more recently for the last 2 months   TSH Normal in 3/18  PHYSICAL EXAM:  BP 126/82   Pulse 80   Ht 5' (1.524 m)   Wt 124 lb (56.2 kg)   SpO2 96%   BMI 24.22 kg/m   GENERAL:   She has no significant swelling of her face No supraclavicular fat pads, no buffalo hump except has mild prominence of the upper thoracic spine No thinning of her skin No hirsutism but does have some thinning of the scalp hair No purplish striate seen  She can get up from a chair without pushing up with her hands   ASSESSMENT:   Patient has had  has features of exogenous Cushing's disease likely from exogenous steroids which is resolving now However dexamethasone suppression test indicated no excess endogenous production Also 24 urine free cortisol was low normal indicating suppression of endogenous production from outside source  Her clinical features appear to be significantly improved compared to her initial visit in October  Discussed with the patient that likely she has a nonfunctioning benign adenoma of the adrenal gland She does have normal levels of aldosterone and metanephrines    PLAN:   Check  cortisol level again today; also needs follow-up dedicated CT scan of adrenal gland in February    Elayne Snare 04/25/2017, 9:42  AM   ADDENDUM: Cortisol level normal at 9.1 No further testing needed

## 2017-05-14 ENCOUNTER — Other Ambulatory Visit: Payer: Self-pay | Admitting: Adult Health

## 2017-05-19 ENCOUNTER — Telehealth (HOSPITAL_COMMUNITY): Payer: Self-pay

## 2017-05-19 NOTE — Telephone Encounter (Signed)
Left message for pt to return call regarding f/u mri. AW

## 2017-05-22 ENCOUNTER — Other Ambulatory Visit: Payer: Self-pay | Admitting: Cardiovascular Disease

## 2017-05-30 ENCOUNTER — Telehealth (HOSPITAL_COMMUNITY): Payer: Self-pay

## 2017-05-30 ENCOUNTER — Other Ambulatory Visit (HOSPITAL_COMMUNITY): Payer: Self-pay | Admitting: Interventional Radiology

## 2017-05-30 DIAGNOSIS — I771 Stricture of artery: Secondary | ICD-10-CM

## 2017-05-30 DIAGNOSIS — I729 Aneurysm of unspecified site: Secondary | ICD-10-CM

## 2017-05-30 NOTE — Telephone Encounter (Signed)
Called to schedule mri, left message for pt to return call. AW 

## 2017-06-05 ENCOUNTER — Ambulatory Visit (HOSPITAL_COMMUNITY): Admission: RE | Admit: 2017-06-05 | Payer: Managed Care, Other (non HMO) | Source: Ambulatory Visit

## 2017-06-05 ENCOUNTER — Ambulatory Visit (HOSPITAL_COMMUNITY)
Admission: RE | Admit: 2017-06-05 | Discharge: 2017-06-05 | Disposition: A | Discharge status: Home / Self Care | Payer: Managed Care, Other (non HMO) | Source: Ambulatory Visit | Attending: Interventional Radiology | Admitting: Interventional Radiology

## 2017-06-05 DIAGNOSIS — I729 Aneurysm of unspecified site: Secondary | ICD-10-CM | POA: Diagnosis present

## 2017-06-05 DIAGNOSIS — I771 Stricture of artery: Secondary | ICD-10-CM | POA: Insufficient documentation

## 2017-06-05 DIAGNOSIS — I672 Cerebral atherosclerosis: Secondary | ICD-10-CM | POA: Insufficient documentation

## 2017-06-05 MED ORDER — GADOBENATE DIMEGLUMINE 529 MG/ML IV SOLN
10.0000 mL | Freq: Once | INTRAVENOUS | Status: AC
  Administered 2017-06-05: 10 mL via INTRAVENOUS

## 2017-06-08 ENCOUNTER — Telehealth: Payer: Self-pay | Admitting: Cardiovascular Disease

## 2017-06-08 NOTE — Telephone Encounter (Signed)
Per phone call--pt's ins changed and she's unable to afford the [facility]. Would like to know if she could get some samples. Please give her a call @ [phone]

## 2017-06-09 NOTE — Telephone Encounter (Signed)
Samples placed at the front desk for pt pick up. Pt will sign patient assistance form for Entresto.

## 2017-06-12 ENCOUNTER — Telehealth: Payer: Self-pay | Admitting: *Deleted

## 2017-06-12 NOTE — Telephone Encounter (Signed)
[employer] Patient Assistance for [employer] 24-26 mg faxed

## 2017-06-14 ENCOUNTER — Telehealth: Payer: Self-pay | Admitting: *Deleted

## 2017-06-14 NOTE — Telephone Encounter (Signed)
Spoke with Sherri at [employer]. Application is pending approval after review of pt insurance.

## 2017-06-26 ENCOUNTER — Encounter: Payer: Self-pay | Admitting: Endocrinology

## 2017-06-26 ENCOUNTER — Ambulatory Visit (INDEPENDENT_AMBULATORY_CARE_PROVIDER_SITE_OTHER): Payer: Managed Care, Other (non HMO) | Admitting: Endocrinology

## 2017-06-26 VITALS — BP 113/73 | HR 77 | Temp 98.0°F | Resp 16 | Ht 61.5 in | Wt 123.0 lb

## 2017-06-26 DIAGNOSIS — D3502 Benign neoplasm of left adrenal gland: Secondary | ICD-10-CM

## 2017-06-26 DIAGNOSIS — R5383 Other fatigue: Secondary | ICD-10-CM

## 2017-06-26 LAB — BASIC METABOLIC PANEL
BUN: 16 mg/dL (ref 6–23)
CO2: 24 mEq/L (ref 19–32)
Calcium: 9.4 mg/dL (ref 8.4–10.5)
Chloride: 108 mEq/L (ref 96–112)
Creatinine, Ser: 0.89 mg/dL (ref 0.40–1.20)
GFR: 69.94 mL/min (ref >60.00)
Glucose, Bld: 110 mg/dL — ABNORMAL HIGH (ref 70–99)
Potassium: 4.2 mEq/L (ref 3.5–5.1)
Sodium: 134 mEq/L — ABNORMAL LOW (ref 135–145)

## 2017-06-26 LAB — T4, FREE: Free T4: 0.72 ng/dL (ref 0.60–1.60)

## 2017-06-26 LAB — CORTISOL: Cortisol, Plasma: 7.4 ug/dL

## 2017-06-26 LAB — TSH: TSH: 0.6 u[IU]/mL (ref 0.35–4.50)

## 2017-06-26 NOTE — Progress Notes (Signed)
Patient ID: Erin [name], female   DOB: 1961/04/25, 57 y.o.   MRN: [id number]          Referring physician: Maurice Small  Chief complaint: Follow-up  History of Present Illness:  History obtained on initial consultation: Patient had an abdominal pain evaluation in August and her CT scan showed an abnormal left adrenal nodule The patient also notes that she has had progressive weight gain for the last 2-3 months; weight normally taking previously stable about 115.  This is without any change in her appetite or use of any exogenous oral or injectable steroid medications She also thinks that she has had swelling of her face compared to her usual appearance  Patient has noticed that her legs get tired more easily and feels like she has walked a long distance.  Also has some difficulty getting up from a low seat She does notice increased bruising more than usual with her Plavix use Also has had some increasing hair loss for the last 3 months or so  Records from PCP office and also results of imaging studies reviewed She was evaluated by her PCP with plasma free metanephrines on 12/28/16 and the result was normal at 15 A.m. cortisol was high at 19.5 from normal up to 19.4 along with an ACTH level of 3.0, low Aldosterone was normal at 2.5  CT scan report: Adrenals/Urinary Tract: There is a heterogeneous 2.6 cm left adrenal nodule. Slight nodularity is noted at the right adrenal gland, not well defined.  RECENT history:  Although she was looking cushingoid on her initial exam with 1 mg dexamethasone her cortisol level went down to 1.8 She was evaluated with a 24 urine free cortisol which was relatively low at only 4.0 although her creatinine indicated possibly less than adequate collection; patient said that she did follow instructions for the collection properly  Despite further questioning patient said that she has not had any exposure to steroids either topical, injectable or oral in the  last few months  In 12/18 her cushingoid features were less and patient had not had any further weight gain R much facial swelling  Since her cortisol was normal in December she is here for routine follow-up  She is still complaining of some hair loss and mild weakness, she thinks she is slightly swollen and her face Also is having other medical issues Her weight is about the same  Wt Readings from Last 3 Encounters:  06/26/17 123 lb (55.8 kg)  04/25/17 124 lb (56.2 kg)  03/27/17 124 lb (56.2 kg)    Past Medical History:  Diagnosis Date  . Anxiety state 03/25/2016  . Basal cell carcinoma of forehead   . Coronary artery disease    a. 03/11/16 PCI with DES-->Prox/Mid Cx;  b. 03/14/16 PCI with DES x2-->RCA, EF 30-35%.  . Essential hypertension   . GERD (gastroesophageal reflux disease)   . HFrEF (heart failure with reduced ejection fraction) ([place])    a. 10/2016 Echo: EF 35-40%, Gr1 DD, mild focal basal septal hypertrophy, basal inflat, mid inflat, basal antlat AK. Mid infept/inf/antlat, apical lateral sev HK. Mod MR. mildly reduced RV fxn. Mild TR.  [name] Kitchen History of pneumonia   . Hyperlipidemia   . Ischemic cardiomyopathy    a. 10/2016 Echo: EF 35-40%, Gr1 DD.  [name] Kitchen Mitral regurgitation   . NSTEMI (non-ST elevated myocardial infarction) ([place]) 03/10/2016  . Squamous cell cancer of skin of nose   . Thrombocytosis ([place]) 03/26/2016  . Tobacco abuse   .  Wears dentures   . Wears glasses     Past Surgical History:  Procedure Laterality Date  . APPENDECTOMY    . CARDIAC CATHETERIZATION N/A 03/11/2016   Procedure: Left Heart Cath and Coronary Angiography;  Surgeon: Leonie Man, MD;  Location: [facility];  Service: Cardiovascular;  Laterality: N/A;  . CARDIAC CATHETERIZATION N/A 03/11/2016   Procedure: Coronary Stent Intervention;  Surgeon: Leonie Man, MD;  Location: [facility];  Service: Cardiovascular;  Laterality: N/A;  . CARDIAC CATHETERIZATION N/A 03/14/2016    Procedure: Coronary Stent Intervention;  Surgeon: Peter M Martinique, MD;  Location: [facility];  Service: Cardiovascular;  Laterality: N/A;  . CHOLECYSTECTOMY OPEN  1984  . CORONARY ANGIOPLASTY WITH STENT PLACEMENT  03/14/2016  . FINGER ARTHROPLASTY Left 05/14/2013   Procedure: LEFT THUMB CARPAL METACARPAL ARTHROPLASTY;  Surgeon: Tennis Must, MD;  Location: [facility];  Service: Orthopedics;  Laterality: Left;  . IR ANGIO INTRA EXTRACRAN SEL COM CAROTID INNOMINATE BILAT MOD SED  01/05/2017  . IR ANGIO VERTEBRAL SEL VERTEBRAL BILAT MOD SED  01/05/2017  . IR RADIOLOGIST EVAL & MGMT  12/30/2016  . TUBAL LIGATION  1987  . VAGINAL HYSTERECTOMY  2009    Family History  Problem Relation Age of Onset  . Diabetes Father   . Hypertension Father   . CAD Father   . Stroke Mother   . Hypertension Mother   . Diabetes Mother   . Heart failure Other     Social History:  reports that she has been smoking cigarettes.  She has smoked for the past 15.00 years. she has never used smokeless tobacco. She reports that she drinks about 3.6 oz of alcohol per week. She reports that she uses drugs. Drugs: Marijuana and MDMA (Ecstacy).  Allergies:  Allergies  Allergen Reactions  . Tape Other (See Comments)    PEELS SKIN OFF  (PAPER TAPE IS FINE)    Allergies as of 06/26/2017      Reactions   Tape Other (See Comments)   PEELS SKIN OFF  (PAPER TAPE IS FINE)      Medication List        Accurate as of 06/26/17 11:12 AM. Always use your most recent med list.          acetaminophen 500 MG tablet Commonly known as:  TYLENOL Take 1 tablet (500 mg total) by mouth every 8 (eight) hours as needed for mild pain, moderate pain or headache.   ALPRAZolam 1 MG tablet Commonly known as:  XANAX Take 0.5-1 mg by mouth at bedtime.   aspirin EC 81 MG tablet Take 81 mg by mouth daily.   atorvastatin 80 MG tablet Commonly known as:  LIPITOR Take 80 mg by mouth daily.   atorvastatin 80 MG  tablet Commonly known as:  LIPITOR TAKE 1 TABLET BY MOUTH ONCE DAILY AT  6  IN  THE  EVENING   calcium carbonate 500 MG chewable tablet Commonly known as:  TUMS - dosed in mg elemental calcium Chew 500 mg by mouth 2 (two) times daily as needed for indigestion or heartburn.   clopidogrel 75 MG tablet Commonly known as:  PLAVIX TAKE ONE TABLET BY MOUTH ONCE DAILY   ezetimibe 10 MG tablet Commonly known as:  ZETIA Take 1 tablet (10 mg total) daily by mouth.   famotidine 20 MG tablet Commonly known as:  PEPCID Take 20 mg by mouth 2 (two) times daily.   furosemide  20 MG tablet Commonly known as:  LASIX TAKE 2 TABLETS BY MOUTH ONCE DAILY   KLOR-CON M20 20 MEQ tablet Generic drug:  potassium chloride SA TAKE 1 TABLET BY MOUTH ONCE DAILY   metoprolol succinate 25 MG 24 hr tablet Commonly known as:  TOPROL XL Take 1 tablet (25 mg total) by mouth daily.   nitroGLYCERIN 0.4 MG SL tablet Commonly known as:  NITROSTAT DISSOLVE ONE TABLET UNDER THE TONGUE EVERY 5 MINUTES AS NEEDED FOR CHEST PAIN.  DO NOT EXCEED A TOTAL OF 3 DOSES IN 15 MINUTES   RANEXA 500 MG 12 hr tablet Generic drug:  ranolazine TAKE 1 TABLET BY MOUTH TWICE DAILY   sacubitril-valsartan 24-26 MG Commonly known as:  ENTRESTO Take 1 tablet by mouth 2 (two) times daily.       LABS:  No visits with results within 1 Week(s) from this visit.  Latest known visit with results is:  Office Visit on 04/25/2017  Component Date Value Ref Range Status  . Sodium 04/25/2017 139  135 - 145 mEq/L Final  . Potassium 04/25/2017 4.8  3.5 - 5.1 mEq/L Final  . Chloride 04/25/2017 106  96 - 112 mEq/L Final  . CO2 04/25/2017 26  19 - 32 mEq/L Final  . Glucose, Bld 04/25/2017 86  70 - 99 mg/dL Final  . BUN 04/25/2017 16  6 - 23 mg/dL Final  . Creatinine, Ser 04/25/2017 0.99  0.40 - 1.20 mg/dL Final  . Calcium 04/25/2017 9.6  8.4 - 10.5 mg/dL Final  . GFR 04/25/2017 61.89  >60.00 mL/min Final  . Cortisol, Plasma 04/25/2017 9.1   ug/dL Final   AM:  4.3 - 22.4 ug/dLPM:  3.1 - 16.7 ug/dL        Review of Systems  Constitutional: Positive for diaphoresis.       Episodes   TSH Normal in 3/18  BP normal at home  PHYSICAL EXAM:  BP 113/73 (BP Location: Left Arm, Patient Position: Sitting, Cuff Size: Small)   Pulse 77   Temp 98 F (36.7 C) (Oral)   Resp 16   Ht 5' 1.5" (1.562 m)   Wt 123 lb (55.8 kg)   SpO2 99%   BMI 22.86 kg/m   GENERAL:   She has some puffiness around her cheeks but not generalized rounding of the face No supraclavicular fat pads, no buffalo hump except has mild prominence of the upper thoracic spine No thinning of her skin, ecchymoses No purplish striae seen She can get up from sitting without difficulty    ASSESSMENT:   Adrenal adenoma, likely nonfunctional  Currently has no definite Cushing's features and do not think there is an issue with her adrenal function She has no history of labile hypertension to suggest pheochromocytoma  She has occasional sweating episodes and this is unexplained, does not have any other concomitant features suggestive of carcinoid also  She does have normal levels of aldosterone and metanephrines    PLAN:   Check cortisol level again Will also check thyroid functions for her fatigue and hair loss Will schedule CT scan of adrenal gland for follow-up    Elayne Snare 06/26/2017, 11:12 AM    Addendum: Thyroid functions and cortisol are normal

## 2017-06-26 NOTE — Progress Notes (Signed)
Please call to let patient know that the lab results are normal and no further action needed

## 2017-06-27 ENCOUNTER — Ambulatory Visit (INDEPENDENT_AMBULATORY_CARE_PROVIDER_SITE_OTHER): Payer: Managed Care, Other (non HMO) | Admitting: Cardiovascular Disease

## 2017-06-27 ENCOUNTER — Telehealth: Payer: Self-pay

## 2017-06-27 ENCOUNTER — Encounter: Payer: Self-pay | Admitting: Cardiovascular Disease

## 2017-06-27 VITALS — BP 106/68 | HR 71 | Ht 61.0 in | Wt 125.0 lb

## 2017-06-27 DIAGNOSIS — Z955 Presence of coronary angioplasty implant and graft: Secondary | ICD-10-CM

## 2017-06-27 DIAGNOSIS — I5042 Chronic combined systolic (congestive) and diastolic (congestive) heart failure: Secondary | ICD-10-CM

## 2017-06-27 DIAGNOSIS — R002 Palpitations: Secondary | ICD-10-CM | POA: Diagnosis not present

## 2017-06-27 DIAGNOSIS — R6884 Jaw pain: Secondary | ICD-10-CM

## 2017-06-27 DIAGNOSIS — I25118 Atherosclerotic heart disease of native coronary artery with other forms of angina pectoris: Secondary | ICD-10-CM

## 2017-06-27 DIAGNOSIS — I252 Old myocardial infarction: Secondary | ICD-10-CM | POA: Diagnosis not present

## 2017-06-27 DIAGNOSIS — I10 Essential (primary) hypertension: Secondary | ICD-10-CM | POA: Diagnosis not present

## 2017-06-27 DIAGNOSIS — E785 Hyperlipidemia, unspecified: Secondary | ICD-10-CM | POA: Diagnosis not present

## 2017-06-27 NOTE — Telephone Encounter (Signed)
Called pt. No answer °

## 2017-06-27 NOTE — Progress Notes (Signed)
SUBJECTIVE: Patient presents for routine follow-up. She has a history of coronary artery disease and ischemic cardiomyopathy, with drug-eluting stent placement to the RCA and PDA 2 in November 2017.  Echocardiogram 10/20/16 showed moderately reduced left ventricular systolic function, LVEF 91-47%, grade 1 diastolic dysfunction with elevated filling pressures. There was mild to moderate mitral regurgitation.  She underwent a nuclear stress test on 03/19/17 which demonstrated a large lateral/inferolateral/inferior/inferoapical infarction with minimal peri-infarct ischemia. It was deemed a high risk study based on low LVEF and large scar.  LVEF was 30%.  30-day event monitoring demonstrated sinus rhythm, sinus tachycardia, and PVCs, with symptoms correlating with all of these.  She denies chest pain.  She has NYHA class II heart failure symptoms.  These have remained stable over the last 6 months.  She has some pain on the right side of her face when chewing in particular in her jaw.  She denies fevers and visual loss.  The symptoms have been occurring for the past 3 weeks.  She has been having some night sweats.  She denies palpitations, leg swelling, orthopnea, and paroxysmal nocturnal dyspnea.  I reviewed her brain MRI which was normal on 06/05/17.  It costs her $157 per month for Entresto.  She has applied for patient assistance.  Labs 05/26/17 which I reviewed: TSH normal at 0.6, sodium 134, BUN 16, creatinine 0.89.    Review of Systems: As per "subjective", otherwise negative.  Allergies  Allergen Reactions  . Tape Other (See Comments)    PEELS SKIN OFF  (PAPER TAPE IS FINE)    Current Outpatient Medications  Medication Sig Dispense Refill  . acetaminophen (TYLENOL) 500 MG tablet Take 1 tablet (500 mg total) by mouth every 8 (eight) hours as needed for mild pain, moderate pain or headache. (Patient taking differently: Take 500 mg by mouth daily as needed for mild pain,  moderate pain or headache. ) 30 tablet 0  . ALPRAZolam (XANAX) 1 MG tablet Take 0.5-1 mg by mouth at bedtime.     [name] Kitchen aspirin EC 81 MG tablet Take 81 mg by mouth daily.    [name] Kitchen atorvastatin (LIPITOR) 80 MG tablet Take 80 mg by mouth daily.    [name] Kitchen atorvastatin (LIPITOR) 80 MG tablet TAKE 1 TABLET BY MOUTH ONCE DAILY AT  6  IN  THE  EVENING 30 tablet 6  . calcium carbonate (TUMS - DOSED IN MG ELEMENTAL CALCIUM) 500 MG chewable tablet Chew 500 mg by mouth 2 (two) times daily as needed for indigestion or heartburn.     . clopidogrel (PLAVIX) 75 MG tablet TAKE ONE TABLET BY MOUTH ONCE DAILY 90 tablet 3  . ezetimibe (ZETIA) 10 MG tablet Take 1 tablet (10 mg total) daily by mouth. 30 tablet 3  . famotidine (PEPCID) 20 MG tablet Take 20 mg by mouth 2 (two) times daily.    . furosemide (LASIX) 20 MG tablet TAKE 2 TABLETS BY MOUTH ONCE DAILY 60 tablet 6  . KLOR-CON M20 20 MEQ tablet TAKE 1 TABLET BY MOUTH ONCE DAILY 90 tablet 3  . metoprolol succinate (TOPROL XL) 25 MG 24 hr tablet Take 1 tablet (25 mg total) by mouth daily. 90 tablet 3  . nitroGLYCERIN (NITROSTAT) 0.4 MG SL tablet DISSOLVE ONE TABLET UNDER THE TONGUE EVERY 5 MINUTES AS NEEDED FOR CHEST PAIN.  DO NOT EXCEED A TOTAL OF 3 DOSES IN 15 MINUTES 25 tablet 3  . RANEXA 500 MG 12 hr tablet TAKE 1 TABLET BY  MOUTH TWICE DAILY 60 tablet 6  . sacubitril-valsartan (ENTRESTO) 24-26 MG Take 1 tablet by mouth 2 (two) times daily. 180 tablet 3   No current facility-administered medications for this visit.     Past Medical History:  Diagnosis Date  . Anxiety state 03/25/2016  . Basal cell carcinoma of forehead   . Coronary artery disease    a. 03/11/16 PCI with DES-->Prox/Mid Cx;  b. 03/14/16 PCI with DES x2-->RCA, EF 30-35%.  . Essential hypertension   . GERD (gastroesophageal reflux disease)   . HFrEF (heart failure with reduced ejection fraction) ([place])    a. 10/2016 Echo: EF 35-40%, Gr1 DD, mild focal basal septal hypertrophy, basal inflat, mid inflat,  basal antlat AK. Mid infept/inf/antlat, apical lateral sev HK. Mod MR. mildly reduced RV fxn. Mild TR.  [name] Kitchen History of pneumonia   . Hyperlipidemia   . Ischemic cardiomyopathy    a. 10/2016 Echo: EF 35-40%, Gr1 DD.  [name] Kitchen Mitral regurgitation   . NSTEMI (non-ST elevated myocardial infarction) ([place]) 03/10/2016  . Squamous cell cancer of skin of nose   . Thrombocytosis ([place]) 03/26/2016  . Tobacco abuse   . Wears dentures   . Wears glasses     Past Surgical History:  Procedure Laterality Date  . APPENDECTOMY    . CARDIAC CATHETERIZATION N/A 03/11/2016   Procedure: Left Heart Cath and Coronary Angiography;  Surgeon: Leonie Man, MD;  Location: [facility];  Service: Cardiovascular;  Laterality: N/A;  . CARDIAC CATHETERIZATION N/A 03/11/2016   Procedure: Coronary Stent Intervention;  Surgeon: Leonie Man, MD;  Location: [facility];  Service: Cardiovascular;  Laterality: N/A;  . CARDIAC CATHETERIZATION N/A 03/14/2016   Procedure: Coronary Stent Intervention;  Surgeon: Peter M Martinique, MD;  Location: [facility];  Service: Cardiovascular;  Laterality: N/A;  . CHOLECYSTECTOMY OPEN  1984  . CORONARY ANGIOPLASTY WITH STENT PLACEMENT  03/14/2016  . FINGER ARTHROPLASTY Left 05/14/2013   Procedure: LEFT THUMB CARPAL METACARPAL ARTHROPLASTY;  Surgeon: Tennis Must, MD;  Location: [facility];  Service: Orthopedics;  Laterality: Left;  . IR ANGIO INTRA EXTRACRAN SEL COM CAROTID INNOMINATE BILAT MOD SED  01/05/2017  . IR ANGIO VERTEBRAL SEL VERTEBRAL BILAT MOD SED  01/05/2017  . IR RADIOLOGIST EVAL & MGMT  12/30/2016  . TUBAL LIGATION  1987  . VAGINAL HYSTERECTOMY  2009    Social History   Socioeconomic History  . Marital status: Married    Spouse name: Not on file  . Number of children: Not on file  . Years of education: Not on file  . Highest education level: Not on file  Social Needs  . Financial resource strain: Not on file  . Food insecurity - worry: Not  on file  . Food insecurity - inability: Not on file  . Transportation needs - medical: Not on file  . Transportation needs - non-medical: Not on file  Occupational History  . Not on file  Tobacco Use  . Smoking status: Current Every Day Smoker    Years: 15.00    Types: Cigarettes  . Smokeless tobacco: Never Used  . Tobacco comment: 3 cigarettes daily   Substance and Sexual Activity  . Alcohol use: Yes    Alcohol/week: 3.6 oz    Types: 6 Cans of beer per week    Comment: occ  . Drug use: Yes    Types: Marijuana, MDMA (Ecstacy)    Comment: former  . Sexual activity: Yes  Birth control/protection: Surgical  Other Topics Concern  . Not on file  Social History Narrative   Lives with husband in [place] in a one story home with a basement.  Has 4 children.  Works as a Quarry manager.  Education: [employer] school.      Vitals:   06/27/17 0852  BP: 106/68  Pulse: 71  SpO2: 99%  Weight: 125 lb (56.7 kg)  Height: 5\' 1"  (1.549 m)    Wt Readings from Last 3 Encounters:  06/27/17 125 lb (56.7 kg)  06/26/17 123 lb (55.8 kg)  04/25/17 124 lb (56.2 kg)     PHYSICAL EXAM General: NAD HEENT: Normal. Neck: No JVD, no thyromegaly. Lungs: Clear to auscultation bilaterally with normal respiratory effort. CV: Regular rate and rhythm, normal S1/S2, no S3/S4, no murmur. No pretibial or periankle edema.  No carotid bruit.   Abdomen: Soft, nontender, no distention.  Neurologic: Alert and oriented.  Psych: Normal affect. Skin: Normal. Musculoskeletal: No gross deformities.    ECG: Most recent ECG reviewed.   Labs: Lab Results  Component Value Date/Time   K 4.2 06/26/2017 11:28 AM   BUN 16 06/26/2017 11:28 AM   BUN 12 07/20/2016 01:27 PM   CREATININE 0.89 06/26/2017 11:28 AM   CREATININE 0.73 05/13/2016 10:20 AM   ALT 14 02/09/2017 12:13 PM   TSH 0.60 06/26/2017 11:28 AM   HGB 11.7 (L) 03/18/2017 11:26 PM     Lipids: Lab Results  Component Value Date/Time   LDLCALC 81 03/19/2017 06:43  AM   CHOL 179 03/19/2017 06:43 AM   TRIG 245 (H) 03/19/2017 06:43 AM   HDL 49 03/19/2017 06:43 AM       ASSESSMENT AND PLAN:  1. Coronary artery disease: Symptomatically stable. Stress test reviewed above.   I will continue aspirin 81 mg and stop Plavix. She is on metoprolol succinate, Lipitor, and Ranexa.  2. Hypertension: Controlled. No changes.  3. Hyperlipidemia:Lipids 03/19/17: TC 179, LDL 81, HDL 49, TG 245. Continue Lipitor 80 mg and Zetia.   4. Ischemiccardiomyopathy/chronic combined systolic and diastolic heart failure: Euvolemic on Toprol-XL and Entresto.  Stable NYHA class II symptoms.  She has applied for patient assistance for Entresto.  5.  Tachycardia/palpitations: No significant arrhythmias with event monitoring as detailed above.  6.  Right-sided jaw pain: There has no temporal artery tenderness to suggest temporal arteritis.  She denies fevers.  I recommended she contact a dentist.    Disposition: Follow up 4 months   Kate Sable, M.D., F.A.C.C.

## 2017-06-27 NOTE — Telephone Encounter (Signed)
-----   Message from Elayne Snare, MD sent at 06/26/2017  8:51 PM EST ----- Please call to let patient know that the lab results are normal and no further action needed

## 2017-06-27 NOTE — Patient Instructions (Addendum)
Your physician wants you to follow-up in:4 months with Dr Bronson Ing    STOP Plavix   All other medications stay the same    No lab work or tests ordered today.      Thank you for choosing [facility] !      [name] Kitchen

## 2017-06-28 ENCOUNTER — Encounter: Payer: Self-pay | Admitting: Endocrinology

## 2017-06-28 NOTE — Addendum Note (Signed)
Addended by: Kaylyn Lim I on: 06/28/2017 08:17 AM   Modules accepted: Orders

## 2017-06-29 LAB — CREATININE, URINE, 24 HOUR
Creatinine, 24H Ur: 595 mg/24 hr — ABNORMAL LOW (ref 800–1800)
Creatinine, Urine: 34 mg/dL

## 2017-07-01 LAB — CORTISOL, URINE, FREE
Cortisol (Ur), Free: 5 ug/24 hr (ref 0–50)
Cortisol,F,ug/L,U: 3 ug/L

## 2017-07-02 NOTE — Progress Notes (Signed)
Please call to let patient know that the urine results are normal and no further action needed

## 2017-07-03 ENCOUNTER — Telehealth: Payer: Self-pay | Admitting: Endocrinology

## 2017-07-03 NOTE — Telephone Encounter (Signed)
Patient returning call (lab results?). Please call pt to advise

## 2017-07-03 NOTE — Telephone Encounter (Signed)
I called pt back- spoke with husband and informed him of below.   Notes recorded by Cresenciano Lick, CMA on 07/03/2017 at 9:58 AM EST Called pt- no answer/vm is not set up. Will try again later. ------  Notes recorded by Elayne Snare, MD on 07/02/2017 at 9:29 PM EST Please call to let patient know that the urine results are normal and no further action needed ------  Notes recorded by Billie Lade, CMA on 06/29/2017 at 3:51 PM EST Covering CMA called but had no answer ------  Notes recorded by Elayne Snare, MD on 06/26/2017 at 8:51 PM EST Please call to let patient know that the lab results are normal and no further action needed

## 2017-07-14 ENCOUNTER — Other Ambulatory Visit: Payer: Self-pay | Admitting: *Deleted

## 2017-07-14 MED ORDER — EZETIMIBE 10 MG PO TABS: 10.0000 mg | ORAL_TABLET | Freq: Every day | ORAL | 11 refills | Status: DC

## 2017-07-24 ENCOUNTER — Other Ambulatory Visit: Payer: Managed Care, Other (non HMO)

## 2017-07-26 ENCOUNTER — Telehealth (HOSPITAL_COMMUNITY): Payer: Self-pay

## 2017-07-26 NOTE — Telephone Encounter (Signed)
Pt agreed to f/u in 6 months with mra only per Dr. Estanislado Pandy. AW

## 2017-07-29 ENCOUNTER — Ambulatory Visit
Admission: RE | Admit: 2017-07-29 | Discharge: 2017-07-29 | Disposition: A | Discharge status: Home / Self Care | Payer: Managed Care, Other (non HMO) | Source: Ambulatory Visit | Attending: Endocrinology | Admitting: Endocrinology

## 2017-07-29 DIAGNOSIS — D3502 Benign neoplasm of left adrenal gland: Secondary | ICD-10-CM

## 2017-07-31 ENCOUNTER — Emergency Department (HOSPITAL_COMMUNITY): Payer: Managed Care, Other (non HMO)

## 2017-07-31 ENCOUNTER — Other Ambulatory Visit: Payer: Self-pay

## 2017-07-31 ENCOUNTER — Encounter (HOSPITAL_COMMUNITY): Payer: Self-pay | Admitting: Emergency Medicine

## 2017-07-31 ENCOUNTER — Observation Stay (HOSPITAL_COMMUNITY)
Admission: EM | Admit: 2017-07-31 | Discharge: 2017-08-01 | Disposition: A | Discharge status: Home / Self Care | Payer: Managed Care, Other (non HMO) | Attending: Internal Medicine | Admitting: Internal Medicine

## 2017-07-31 DIAGNOSIS — Z79899 Other long term (current) drug therapy: Secondary | ICD-10-CM | POA: Insufficient documentation

## 2017-07-31 DIAGNOSIS — K219 Gastro-esophageal reflux disease without esophagitis: Secondary | ICD-10-CM | POA: Diagnosis not present

## 2017-07-31 DIAGNOSIS — I5042 Chronic combined systolic (congestive) and diastolic (congestive) heart failure: Secondary | ICD-10-CM | POA: Diagnosis not present

## 2017-07-31 DIAGNOSIS — F17210 Nicotine dependence, cigarettes, uncomplicated: Secondary | ICD-10-CM | POA: Insufficient documentation

## 2017-07-31 DIAGNOSIS — I2510 Atherosclerotic heart disease of native coronary artery without angina pectoris: Secondary | ICD-10-CM | POA: Diagnosis not present

## 2017-07-31 DIAGNOSIS — C44319 Basal cell carcinoma of skin of other parts of face: Secondary | ICD-10-CM | POA: Diagnosis not present

## 2017-07-31 DIAGNOSIS — D644 Congenital dyserythropoietic anemia: Secondary | ICD-10-CM | POA: Diagnosis not present

## 2017-07-31 DIAGNOSIS — I110 Hypertensive heart disease with heart failure: Secondary | ICD-10-CM | POA: Insufficient documentation

## 2017-07-31 DIAGNOSIS — E782 Mixed hyperlipidemia: Secondary | ICD-10-CM | POA: Diagnosis not present

## 2017-07-31 DIAGNOSIS — Z7982 Long term (current) use of aspirin: Secondary | ICD-10-CM | POA: Diagnosis not present

## 2017-07-31 DIAGNOSIS — I259 Chronic ischemic heart disease, unspecified: Secondary | ICD-10-CM | POA: Diagnosis not present

## 2017-07-31 DIAGNOSIS — I340 Nonrheumatic mitral (valve) insufficiency: Secondary | ICD-10-CM | POA: Insufficient documentation

## 2017-07-31 DIAGNOSIS — Z955 Presence of coronary angioplasty implant and graft: Secondary | ICD-10-CM | POA: Diagnosis not present

## 2017-07-31 DIAGNOSIS — C44311 Basal cell carcinoma of skin of nose: Secondary | ICD-10-CM | POA: Diagnosis not present

## 2017-07-31 DIAGNOSIS — E785 Hyperlipidemia, unspecified: Secondary | ICD-10-CM | POA: Diagnosis present

## 2017-07-31 DIAGNOSIS — R079 Chest pain, unspecified: Secondary | ICD-10-CM | POA: Diagnosis not present

## 2017-07-31 DIAGNOSIS — D649 Anemia, unspecified: Secondary | ICD-10-CM | POA: Diagnosis present

## 2017-07-31 DIAGNOSIS — I10 Essential (primary) hypertension: Secondary | ICD-10-CM | POA: Diagnosis present

## 2017-07-31 LAB — CBC
HCT: 33.6 % — ABNORMAL LOW (ref 36.0–46.0)
Hemoglobin: 11 g/dL — ABNORMAL LOW (ref 12.0–15.0)
MCH: 28.6 pg (ref 26.0–34.0)
MCHC: 32.7 g/dL (ref 30.0–36.0)
MCV: 87.3 fL (ref 78.0–100.0)
Platelets: 420 10*3/uL — ABNORMAL HIGH (ref 150–400)
RBC: 3.85 MIL/uL — ABNORMAL LOW (ref 3.87–5.11)
RDW: 14.4 % (ref 11.5–15.5)
WBC: 8.1 10*3/uL (ref 4.0–10.5)

## 2017-07-31 LAB — BASIC METABOLIC PANEL
Anion gap: 12 (ref 5–15)
BUN: 20 mg/dL (ref 6–20)
CO2: 19 mmol/L — ABNORMAL LOW (ref 22–32)
Calcium: 9.7 mg/dL (ref 8.9–10.3)
Chloride: 106 mmol/L (ref 101–111)
Creatinine, Ser: 0.99 mg/dL (ref 0.44–1.00)
GFR calc Af Amer: >60 mL/min (ref >60)
GFR calc non Af Amer: >60 mL/min (ref >60)
Glucose, Bld: 96 mg/dL (ref 65–99)
Potassium: 3.6 mmol/L (ref 3.5–5.1)
Sodium: 137 mmol/L (ref 135–145)

## 2017-07-31 LAB — DIFFERENTIAL
Basophils Absolute: 0 10*3/uL (ref 0.0–0.1)
Basophils Relative: 0 %
Eosinophils Absolute: 0.1 10*3/uL (ref 0.0–0.7)
Eosinophils Relative: 1 %
Lymphocytes Relative: 34 %
Lymphs Abs: 2.7 10*3/uL (ref 0.7–4.0)
Monocytes Absolute: 0.7 10*3/uL (ref 0.1–1.0)
Monocytes Relative: 9 %
Neutro Abs: 4.5 10*3/uL (ref 1.7–7.7)
Neutrophils Relative %: 56 %

## 2017-07-31 LAB — HEPATIC FUNCTION PANEL
ALT: 15 U/L (ref 14–54)
AST: 19 U/L (ref 15–41)
Albumin: 3.9 g/dL (ref 3.5–5.0)
Alkaline Phosphatase: 61 U/L (ref 38–126)
Bilirubin, Direct: 0.2 mg/dL (ref 0.1–0.5)
Indirect Bilirubin: 0.9 mg/dL (ref 0.3–0.9)
Total Bilirubin: 1.1 mg/dL (ref 0.3–1.2)
Total Protein: 7 g/dL (ref 6.5–8.1)

## 2017-07-31 LAB — I-STAT TROPONIN, ED: Troponin i, poc: 0 ng/mL (ref 0.00–0.08)

## 2017-07-31 MED ORDER — FUROSEMIDE 40 MG PO TABS
40.0000 mg | ORAL_TABLET | Freq: Every day | ORAL | Status: DC
  Administered 2017-08-01: 40 mg via ORAL
  Filled 2017-07-31: qty 1

## 2017-07-31 MED ORDER — EZETIMIBE 10 MG PO TABS
10.0000 mg | ORAL_TABLET | Freq: Every day | ORAL | Status: DC
  Administered 2017-08-01: 10 mg via ORAL
  Filled 2017-07-31 (×3): qty 1

## 2017-07-31 MED ORDER — NITROGLYCERIN 0.4 MG SL SUBL
0.4000 mg | SUBLINGUAL_TABLET | SUBLINGUAL | Status: DC | PRN
  Administered 2017-07-31 (×2): 0.4 mg via SUBLINGUAL
  Filled 2017-07-31 (×2): qty 1

## 2017-07-31 MED ORDER — RANOLAZINE ER 500 MG PO TB12
500.0000 mg | ORAL_TABLET | Freq: Two times a day (BID) | ORAL | Status: DC
  Administered 2017-08-01: 500 mg via ORAL
  Filled 2017-07-31 (×5): qty 1

## 2017-07-31 MED ORDER — ONDANSETRON HCL 4 MG PO TABS
4.0000 mg | ORAL_TABLET | Freq: Four times a day (QID) | ORAL | Status: DC | PRN
  Administered 2017-08-01: 4 mg via ORAL
  Filled 2017-07-31: qty 1

## 2017-07-31 MED ORDER — ACETAMINOPHEN 325 MG PO TABS: 650.0000 mg | ORAL_TABLET | Freq: Four times a day (QID) | ORAL | Status: DC | PRN

## 2017-07-31 MED ORDER — ONDANSETRON HCL 4 MG/2ML IJ SOLN: 4.0000 mg | Freq: Four times a day (QID) | INTRAMUSCULAR | Status: DC | PRN

## 2017-07-31 MED ORDER — NITROGLYCERIN 0.4 MG SL SUBL: 0.4000 mg | SUBLINGUAL_TABLET | SUBLINGUAL | Status: DC | PRN

## 2017-07-31 MED ORDER — SACUBITRIL-VALSARTAN 24-26 MG PO TABS
1.0000 | ORAL_TABLET | Freq: Two times a day (BID) | ORAL | Status: DC
Start: 2017-07-31 — End: 2017-08-01
  Filled 2017-07-31 (×4): qty 1

## 2017-07-31 MED ORDER — ALPRAZOLAM 0.25 MG PO TABS: 0.2500 mg | ORAL_TABLET | Freq: Two times a day (BID) | ORAL | Status: DC | PRN

## 2017-07-31 MED ORDER — PANTOPRAZOLE SODIUM 40 MG PO TBEC
40.0000 mg | DELAYED_RELEASE_TABLET | Freq: Every day | ORAL | Status: DC
  Administered 2017-08-01 (×2): 40 mg via ORAL
  Filled 2017-07-31 (×2): qty 1

## 2017-07-31 MED ORDER — ACETAMINOPHEN 650 MG RE SUPP: 650.0000 mg | Freq: Four times a day (QID) | RECTAL | Status: DC | PRN

## 2017-07-31 MED ORDER — CALCIUM CARBONATE ANTACID 500 MG PO CHEW
500.0000 mg | CHEWABLE_TABLET | Freq: Two times a day (BID) | ORAL | Status: DC | PRN
Start: 2017-07-31 — End: 2017-08-01
  Filled 2017-07-31: qty 1

## 2017-07-31 MED ORDER — ASPIRIN EC 81 MG PO TBEC: 81.0000 mg | DELAYED_RELEASE_TABLET | Freq: Every evening | ORAL | Status: DC

## 2017-07-31 MED ORDER — MORPHINE SULFATE (PF) 2 MG/ML IV SOLN
2.0000 mg | Freq: Once | INTRAVENOUS | Status: AC
  Administered 2017-07-31: 2 mg via INTRAVENOUS
  Filled 2017-07-31: qty 1

## 2017-07-31 MED ORDER — FAMOTIDINE 20 MG PO TABS
20.0000 mg | ORAL_TABLET | Freq: Two times a day (BID) | ORAL | Status: DC
  Administered 2017-08-01 (×2): 20 mg via ORAL
  Filled 2017-07-31 (×2): qty 1

## 2017-07-31 MED ORDER — ENOXAPARIN SODIUM 40 MG/0.4ML ~~LOC~~ SOLN: 40.0000 mg | SUBCUTANEOUS | Status: DC

## 2017-07-31 MED ORDER — METOPROLOL SUCCINATE ER 25 MG PO TB24
25.0000 mg | ORAL_TABLET | Freq: Every day | ORAL | Status: DC
  Administered 2017-08-01: 25 mg via ORAL
  Filled 2017-07-31 (×3): qty 1

## 2017-07-31 MED ORDER — POTASSIUM CHLORIDE CRYS ER 20 MEQ PO TBCR
20.0000 meq | EXTENDED_RELEASE_TABLET | Freq: Every day | ORAL | Status: DC
  Administered 2017-08-01: 20 meq via ORAL
  Filled 2017-07-31: qty 1

## 2017-07-31 MED ORDER — ALPRAZOLAM 0.5 MG PO TABS
0.5000 mg | ORAL_TABLET | Freq: Every day | ORAL | Status: DC
  Administered 2017-08-01: 1 mg via ORAL
  Filled 2017-07-31: qty 2

## 2017-07-31 MED ORDER — ATORVASTATIN CALCIUM 40 MG PO TABS
80.0000 mg | ORAL_TABLET | Freq: Every day | ORAL | Status: DC
  Administered 2017-08-01: 80 mg via ORAL
  Filled 2017-07-31: qty 2
  Filled 2017-07-31 (×2): qty 1

## 2017-07-31 MED ORDER — ENOXAPARIN SODIUM 40 MG/0.4ML ~~LOC~~ SOLN
40.0000 mg | SUBCUTANEOUS | Status: DC
  Administered 2017-07-31: 40 mg via SUBCUTANEOUS
  Filled 2017-07-31: qty 0.4

## 2017-07-31 NOTE — ED Triage Notes (Signed)
Pt c/o intermittent chest pain all day and states it feels as if her throat is closing.

## 2017-07-31 NOTE — H&P (Signed)
History and Physical    Erin [name] TDV:[id number] DOB: 04-Nov-1960 DOA: 07/31/2017  PCP: Maurice Small, MD   Patient coming from: Home.  I have personally briefly reviewed patient's old medical records in [place]  Chief Complaint: Chest pain.  HPI: Erin [name] is a 57 y.o. female with medical history significant of anxiety, basal cell CA of forehead, essential hypertension, GERD, chronic systolic heart failure, history of pneumonia, hyperlipidemia, thrombocytosis, tobacco abuse, mitral regurgitation, CAD, NSTEMI, history of stent placement x2 who is coming to the emergency department complaining of chest pain.  Per patient, around 1600, just after she got back from work she felt an intense pressure on her chest.  She felt like something very heavy was sitting on her chest.  This pressure was radiated to her back, without associated with dyspnea, diaphoresis, nausea, but no emesis and lying down made it better.  She mentions that she had a chest pain episode similar to these, but milder in intensity in December when her granddaughter was killed.  She denies PND, orthopnea or recent pitting edema of the lower extremities.  No fever, no chills, no sore throat, no cough, wheezing, hemoptysis, abdominal pain, diarrhea, constipation, melena or hematochezia.  She denies dysuria, frequency or hematuria.  Denies blurred vision, heat or cold intolerance.  She denies skin rashes or pruritus.  ED Course: Initial vital signs temperature 36.8C (98.2 F, pulse 105, respirations 20, blood pressure 130/94 mmHg and O2 sat 97% on room air.  She received 2 mg of morphine IVP.  First troponin was normal.  EKG shows sinus tachycardia with probable left atrial enlargement.  Low voltage extremity leads.  CBC shows a white count of 8.1 with a normal differential, hemoglobin 11.0 g/dL and platelets 420.  BMP shows CO2 of 19 mmol/L, all other values were normal.  Hepatic function panel was within normal  limits.  Her chest radiograph did not show any acute cardiopulmonary pathology.  Please see image and full radiology report for further detail.  Review of Systems: As per HPI otherwise 10 point review of systems negative.    Past Medical History:  Diagnosis Date  . Anxiety state 03/25/2016  . Basal cell carcinoma of forehead   . Coronary artery disease    a. 03/11/16 PCI with DES-->Prox/Mid Cx;  b. 03/14/16 PCI with DES x2-->RCA, EF 30-35%.  . Essential hypertension   . GERD (gastroesophageal reflux disease)   . HFrEF (heart failure with reduced ejection fraction) ([place])    a. 10/2016 Echo: EF 35-40%, Gr1 DD, mild focal basal septal hypertrophy, basal inflat, mid inflat, basal antlat AK. Mid infept/inf/antlat, apical lateral sev HK. Mod MR. mildly reduced RV fxn. Mild TR.  [name] Kitchen History of pneumonia   . Hyperlipidemia   . Ischemic cardiomyopathy    a. 10/2016 Echo: EF 35-40%, Gr1 DD.  [name] Kitchen Mitral regurgitation   . NSTEMI (non-ST elevated myocardial infarction) ([place]) 03/10/2016  . Squamous cell cancer of skin of nose   . Thrombocytosis ([place]) 03/26/2016  . Tobacco abuse   . Wears dentures   . Wears glasses     Past Surgical History:  Procedure Laterality Date  . APPENDECTOMY    . CARDIAC CATHETERIZATION N/A 03/11/2016   Procedure: Left Heart Cath and Coronary Angiography;  Surgeon: Leonie Man, MD;  Location: [place] [facility];  Service: Cardiovascular;  Laterality: N/A;  . CARDIAC CATHETERIZATION N/A 03/11/2016   Procedure: Coronary Stent Intervention;  Surgeon: Leonie Man, MD;  Location: [facility]  INVASIVE CV LAB;  Service: Cardiovascular;  Laterality: N/A;  . CARDIAC CATHETERIZATION N/A 03/14/2016   Procedure: Coronary Stent Intervention;  Surgeon: Peter M Martinique, MD;  Location: [place] [facility] LAB;  Service: Cardiovascular;  Laterality: N/A;  . CHOLECYSTECTOMY OPEN  1984  . CORONARY ANGIOPLASTY WITH STENT PLACEMENT  03/14/2016  . FINGER ARTHROPLASTY Left 05/14/2013   Procedure: LEFT THUMB  CARPAL METACARPAL ARTHROPLASTY;  Surgeon: Tennis Must, MD;  Location: [place];  Service: Orthopedics;  Laterality: Left;  . IR ANGIO INTRA EXTRACRAN SEL COM CAROTID INNOMINATE BILAT MOD SED  01/05/2017  . IR ANGIO VERTEBRAL SEL VERTEBRAL BILAT MOD SED  01/05/2017  . IR RADIOLOGIST EVAL & MGMT  12/30/2016  . TUBAL LIGATION  1987  . VAGINAL HYSTERECTOMY  2009     reports that she has been smoking cigarettes.  She has smoked for the past 15.00 years. She has never used smokeless tobacco. She reports that she drinks about 3.6 oz of alcohol per week. She reports that she has current or past drug history. Drugs: Marijuana and MDMA (Ecstacy).  Allergies  Allergen Reactions  . Tape Other (See Comments)    PEELS SKIN OFF  (PAPER TAPE IS FINE)    Family History  Problem Relation Age of Onset  . Diabetes Father   . Hypertension Father   . CAD Father   . Stroke Mother   . Hypertension Mother   . Diabetes Mother   . Heart failure Other     Prior to Admission medications   Medication Sig Start Date End Date Taking? Authorizing Provider  ALPRAZolam Duanne Moron) 1 MG tablet Take 0.5-1 mg by mouth at bedtime.    Yes [provider]  aspirin EC 81 MG tablet Take 81 mg by mouth every evening.    Yes [provider]  atorvastatin (LIPITOR) 80 MG tablet Take 80 mg by mouth daily.   Yes [provider]  calcium carbonate (TUMS - DOSED IN MG ELEMENTAL CALCIUM) 500 MG chewable tablet Chew 500 mg by mouth 2 (two) times daily as needed for indigestion or heartburn.    Yes [provider]  ezetimibe (ZETIA) 10 MG tablet Take 1 tablet (10 mg total) by mouth daily. 07/14/17  Yes Herminio Commons, MD  famotidine (PEPCID) 20 MG tablet Take 20 mg by mouth 2 (two) times daily.   Yes [provider]  furosemide (LASIX) 20 MG tablet TAKE 2 TABLETS BY MOUTH ONCE DAILY 02/07/17  Yes Herminio Commons, MD  KLOR-CON M20 20 MEQ tablet TAKE 1 TABLET BY MOUTH  ONCE DAILY 04/11/17  Yes Herminio Commons, MD  metoprolol succinate (TOPROL XL) 25 MG 24 hr tablet Take 1 tablet (25 mg total) by mouth daily. 12/16/16  Yes Herminio Commons, MD  nitroGLYCERIN (NITROSTAT) 0.4 MG SL tablet DISSOLVE ONE TABLET UNDER THE TONGUE EVERY 5 MINUTES AS NEEDED FOR CHEST PAIN.  DO NOT EXCEED A TOTAL OF 3 DOSES IN 15 MINUTES 09/07/16  Yes Josue Hector, MD  RANEXA 500 MG 12 hr tablet TAKE 1 TABLET BY MOUTH TWICE DAILY 04/10/17  Yes Herminio Commons, MD  sacubitril-valsartan (ENTRESTO) 24-26 MG Take 1 tablet by mouth 2 (two) times daily. 12/16/16  Yes Herminio Commons, MD    Physical Exam: Vitals:   07/31/17 2030 07/31/17 2031 07/31/17 2032 07/31/17 2130  BP: (!) 138/94  (!) 138/94 127/89  Pulse: (!) 105  100 85  Resp: 20  17 16   Temp:  98.2 F (36.8 C)   SpO2: 97%  97% 100%  Weight:  56.2 kg (124 lb)    Height:  5' (1.524 m)      Constitutional: NAD, calm, comfortable Eyes: PERRL, lids and conjunctivae normal ENMT: Mucous membranes are moist. Posterior pharynx clear of any exudate or lesions. Neck: normal, supple, no masses, no thyromegaly Respiratory: clear to auscultation bilaterally, no wheezing, no crackles. Normal respiratory effort. No accessory muscle use.  Cardiovascular: Regular rate and rhythm, no murmurs / rubs / gallops. No extremity edema. 2+ pedal pulses. No carotid bruits.  Abdomen: Soft, no tenderness, no masses palpated. No hepatosplenomegaly. Bowel sounds positive.  Musculoskeletal: no clubbing / cyanosis. Good ROM, no contractures. Normal muscle tone.  Skin: Upper extremities showed some ecchymosis areas, particularly on LUE. Neurologic: CN 2-12 grossly intact. Sensation intact, DTR normal. Strength 5/5 in all 4.  Psychiatric: Normal judgment and insight. Alert and oriented x 4. Normal mood.    Labs on Admission: I have personally reviewed following labs and imaging studies  CBC: Recent Labs  Lab 07/31/17 2114  WBC 8.1    NEUTROABS 4.5  HGB 11.0*  HCT 33.6*  MCV 87.3  PLT 950*   Basic Metabolic Panel: Recent Labs  Lab 07/31/17 2114  NA 137  K 3.6  CL 106  CO2 19*  GLUCOSE 96  BUN 20  CREATININE 0.99  CALCIUM 9.7   GFR: Estimated Creatinine Clearance: 50.5 mL/min (by C-G formula based on SCr of 0.99 mg/dL). Liver Function Tests: Recent Labs  Lab 07/31/17 2114  AST 19  ALT 15  ALKPHOS 61  BILITOT 1.1  PROT 7.0  ALBUMIN 3.9   No results for input(s): LIPASE, AMYLASE in the last 168 hours. No results for input(s): AMMONIA in the last 168 hours. Coagulation Profile: No results for input(s): INR, PROTIME in the last 168 hours. Cardiac Enzymes: No results for input(s): CKTOTAL, CKMB, CKMBINDEX, TROPONINI in the last 168 hours. BNP (last 3 results) No results for input(s): PROBNP in the last 8760 hours. HbA1C: No results for input(s): HGBA1C in the last 72 hours. CBG: No results for input(s): GLUCAP in the last 168 hours. Lipid Profile: No results for input(s): CHOL, HDL, LDLCALC, TRIG, CHOLHDL, LDLDIRECT in the last 72 hours. Thyroid Function Tests: No results for input(s): TSH, T4TOTAL, FREET4, T3FREE, THYROIDAB in the last 72 hours. Anemia Panel: No results for input(s): VITAMINB12, FOLATE, FERRITIN, TIBC, IRON, RETICCTPCT in the last 72 hours. Urine analysis:   Radiological Exams on Admission: Dg Chest Portable 1 View  Result Date: 07/31/2017 CLINICAL DATA:  57 y/o F; chest pain today. Feels throat closing up. EXAM: PORTABLE CHEST 1 VIEW COMPARISON:  03/19/2017 chest radiograph. FINDINGS: Stable heart size and mediastinal contours are within normal limits. Both lungs are clear. The visualized skeletal structures are unremarkable. IMPRESSION: No active disease. Electronically Signed   By: Kristine Garbe M.D.   On: 07/31/2017 20:56    EKG: Independently reviewed.  Vent. rate 102 BPM PR interval * ms QRS duration 80 ms QT/QTc 341/445 ms P-R-T axes 78 30 55 Sinus  tachycardia Probable left atrial enlargement Low voltage, extremity leads  Assessment/Plan Principal Problem:   Chest pain Telemetry/observation. Continue supplemental oxygen. Continue sublingual nitroglycerin as needed. Continue morphine sulfate 2 mg IVP every 2 hours as needed. Trend troponin levels. Check EKG in the morning. Reconsult cardiology if needed.  Active Problems:   CAD (coronary artery disease), native coronary artery As above. Continue aspirin, atorvastatin, metoprolol and ranolazine.  Essential hypertension Continue Toprol-XL 25 mg p.o. daily. Continue Entresto 24-26 mg p.o. 1 tab twice daily. Also taking furosemide 40 mg p.o. daily. Monitor blood pressure, heart rate, renal function and electrolytes.    Chronic combined systolic and diastolic CHF (congestive heart failure) (HCC) No signs of decompensation at this time. Continue Toprol, Entresto and furosemide along with potassium supplement.    Mixed hyperlipidemia Continue atorvastatin 80 mg p.o. daily. Continue ezetimibe 10 mg p.o. daily. LFTs were normal. Fasting lipid panel to be followed by PCP.[name] Kitchen    GERD (gastroesophageal reflux disease) Protonix 40 mg p.o. daily.    Normocytic anemia Monitor hematocrit and hemoglobin.    DVT prophylaxis: Lovenox SQ. Code Status: Full code. Family Communication:  Disposition Plan: Observation for cardiac monitoring and troponin level trending. Consults called:  Admission status: Observation/telemetry.   Reubin Milan MD [facility] Pager [phone].  If 7PM-7AM, please contact night-coverage [email or web address] Password [facility]  07/31/2017, 10:18 PM

## 2017-07-31 NOTE — ED Provider Notes (Signed)
[facility] EMERGENCY DEPARTMENT Provider Note   CSN: [id number] Arrival date & time: 07/31/17  1959     History   Chief Complaint Chief Complaint  Patient presents with  . Chest Pain    HPI Erin [name] is a 57 y.o. female.  Patient complains of chest pressure and pain that has been steady since 4 PM today.  Patient has a history of 2 stents  The history is provided by the patient. No language interpreter was used.  Chest Pain   This is a new problem. The current episode started 6 to 12 hours ago. The problem occurs constantly. The problem has not changed since onset.The pain is associated with rest. The pain is present in the substernal region. The pain is at a severity of 7/10. The pain is severe. The quality of the pain is described as heavy. The pain does not radiate. Pertinent negatives include no abdominal pain, no back pain, no cough and no headaches.  Pertinent negatives for past medical history include no seizures.    Past Medical History:  Diagnosis Date  . Anxiety state 03/25/2016  . Basal cell carcinoma of forehead   . Coronary artery disease    a. 03/11/16 PCI with DES-->Prox/Mid Cx;  b. 03/14/16 PCI with DES x2-->RCA, EF 30-35%.  . Essential hypertension   . GERD (gastroesophageal reflux disease)   . HFrEF (heart failure with reduced ejection fraction) ([place])    a. 10/2016 Echo: EF 35-40%, Gr1 DD, mild focal basal septal hypertrophy, basal inflat, mid inflat, basal antlat AK. Mid infept/inf/antlat, apical lateral sev HK. Mod MR. mildly reduced RV fxn. Mild TR.  [name] Kitchen History of pneumonia   . Hyperlipidemia   . Ischemic cardiomyopathy    a. 10/2016 Echo: EF 35-40%, Gr1 DD.  [name] Kitchen Mitral regurgitation   . NSTEMI (non-ST elevated myocardial infarction) ([place]) 03/10/2016  . Squamous cell cancer of skin of nose   . Thrombocytosis ([place]) 03/26/2016  . Tobacco abuse   . Wears dentures   . Wears glasses     Patient Active Problem List   Diagnosis Date Noted  . Chronic  combined systolic and diastolic CHF (congestive heart failure) ([place]) 06/20/2016  . Hypokalemia   . Acute CHF (congestive heart failure) ([place]) 05/16/2016  . Acute on chronic systolic CHF (congestive heart failure) ([place]) 05/16/2016  . Acute respiratory failure with hypoxia ([place])   . Thrombocytosis ([place]) 03/26/2016  . Cardiomyopathy, ischemic 03/25/2016  . Acute on chronic combined systolic and diastolic congestive heart failure ([place]) 03/25/2016  . Anxiety state 03/25/2016  . Troponin level elevated 03/25/2016  . CAD (coronary artery disease), native coronary artery 03/25/2016  . Normocytic anemia 03/25/2016  . SOB (shortness of breath) 03/24/2016  . Lightheadedness 03/17/2016  . Hypotension 03/17/2016  . Tobacco abuse 03/12/2016  . NSTEMI (non-ST elevated myocardial infarction) ([place]) 03/11/2016  . Pain in the chest   . Essential hypertension 09/06/2015  . Mixed hyperlipidemia 09/06/2015  . GERD (gastroesophageal reflux disease) 09/06/2015  . Chest pain 09/06/2015    Past Surgical History:  Procedure Laterality Date  . APPENDECTOMY    . CARDIAC CATHETERIZATION N/A 03/11/2016   Procedure: Left Heart Cath and Coronary Angiography;  Surgeon: Leonie Man, MD;  Location: [place] [facility];  Service: Cardiovascular;  Laterality: N/A;  . CARDIAC CATHETERIZATION N/A 03/11/2016   Procedure: Coronary Stent Intervention;  Surgeon: Leonie Man, MD;  Location: [place] [facility];  Service: Cardiovascular;  Laterality: N/A;  . CARDIAC CATHETERIZATION N/A  03/14/2016   Procedure: Coronary Stent Intervention;  Surgeon: Peter M Martinique, MD;  Location: [place] [facility];  Service: Cardiovascular;  Laterality: N/A;  . CHOLECYSTECTOMY OPEN  1984  . CORONARY ANGIOPLASTY WITH STENT PLACEMENT  03/14/2016  . FINGER ARTHROPLASTY Left 05/14/2013   Procedure: LEFT THUMB CARPAL METACARPAL ARTHROPLASTY;  Surgeon: Tennis Must, MD;  Location: [place];  Service: Orthopedics;  Laterality:  Left;  . IR ANGIO INTRA EXTRACRAN SEL COM CAROTID INNOMINATE BILAT MOD SED  01/05/2017  . IR ANGIO VERTEBRAL SEL VERTEBRAL BILAT MOD SED  01/05/2017  . IR RADIOLOGIST EVAL & MGMT  12/30/2016  . TUBAL LIGATION  1987  . VAGINAL HYSTERECTOMY  2009     OB History    Gravida  4   Para  4   Term  4   Preterm      AB      Living  4     SAB      TAB      Ectopic      Multiple      Live Births               Home Medications    Prior to Admission medications   Medication Sig Start Date End Date Taking? Authorizing Provider  ALPRAZolam Duanne Moron) 1 MG tablet Take 0.5-1 mg by mouth at bedtime.    Yes [provider]  aspirin EC 81 MG tablet Take 81 mg by mouth every evening.    Yes [provider]  atorvastatin (LIPITOR) 80 MG tablet Take 80 mg by mouth daily.   Yes [provider]  calcium carbonate (TUMS - DOSED IN MG ELEMENTAL CALCIUM) 500 MG chewable tablet Chew 500 mg by mouth 2 (two) times daily as needed for indigestion or heartburn.    Yes [provider]  ezetimibe (ZETIA) 10 MG tablet Take 1 tablet (10 mg total) by mouth daily. 07/14/17  Yes Herminio Commons, MD  famotidine (PEPCID) 20 MG tablet Take 20 mg by mouth 2 (two) times daily.   Yes [provider]  furosemide (LASIX) 20 MG tablet TAKE 2 TABLETS BY MOUTH ONCE DAILY 02/07/17  Yes Herminio Commons, MD  KLOR-CON M20 20 MEQ tablet TAKE 1 TABLET BY MOUTH ONCE DAILY 04/11/17  Yes Herminio Commons, MD  metoprolol succinate (TOPROL XL) 25 MG 24 hr tablet Take 1 tablet (25 mg total) by mouth daily. 12/16/16  Yes Herminio Commons, MD  nitroGLYCERIN (NITROSTAT) 0.4 MG SL tablet DISSOLVE ONE TABLET UNDER THE TONGUE EVERY 5 MINUTES AS NEEDED FOR CHEST PAIN.  DO NOT EXCEED A TOTAL OF 3 DOSES IN 15 MINUTES 09/07/16  Yes Josue Hector, MD  RANEXA 500 MG 12 hr tablet TAKE 1 TABLET BY MOUTH TWICE DAILY 04/10/17  Yes Herminio Commons, MD  sacubitril-valsartan (ENTRESTO)  24-26 MG Take 1 tablet by mouth 2 (two) times daily. 12/16/16  Yes Herminio Commons, MD    Family History Family History  Problem Relation Age of Onset  . Diabetes Father   . Hypertension Father   . CAD Father   . Stroke Mother   . Hypertension Mother   . Diabetes Mother   . Heart failure Other     Social History Social History   Tobacco Use  . Smoking status: Current Every Day Smoker    Years: 15.00    Types: Cigarettes  . Smokeless tobacco: Never Used  . Tobacco comment: 3 cigarettes  daily   Substance Use Topics  . Alcohol use: Yes    Alcohol/week: 3.6 oz    Types: 6 Cans of beer per week    Comment: occ  . Drug use: Not Currently    Types: Marijuana, MDMA (Ecstacy)    Comment: former     Allergies   Tape   Review of Systems Review of Systems  Constitutional: Negative for appetite change and fatigue.  HENT: Negative for congestion, ear discharge and sinus pressure.   Eyes: Negative for discharge.  Respiratory: Negative for cough.   Cardiovascular: Positive for chest pain.  Gastrointestinal: Negative for abdominal pain and diarrhea.  Genitourinary: Negative for frequency and hematuria.  Musculoskeletal: Negative for back pain.  Skin: Negative for rash.  Neurological: Negative for seizures and headaches.  Psychiatric/Behavioral: Negative for hallucinations.     Physical Exam Updated Vital Signs BP 127/89   Pulse 85   Temp 98.2 F (36.8 C)   Resp 16   Ht 5' (1.524 m)   Wt 56.2 kg (124 lb)   SpO2 100%   BMI 24.22 kg/m   Physical Exam  Constitutional: She is oriented to person, place, and time. She appears well-developed.  HENT:  Head: Normocephalic.  Eyes: Conjunctivae and EOM are normal. No scleral icterus.  Neck: Neck supple. No thyromegaly present.  Cardiovascular: Normal rate and regular rhythm. Exam reveals no gallop and no friction rub.  No murmur heard. Pulmonary/Chest: No stridor. She has no wheezes. She has no rales. She exhibits  no tenderness.  Abdominal: She exhibits no distension. There is no tenderness. There is no rebound.  Musculoskeletal: Normal range of motion. She exhibits no edema.  Lymphadenopathy:    She has no cervical adenopathy.  Neurological: She is oriented to person, place, and time. She exhibits normal muscle tone. Coordination normal.  Skin: No rash noted. No erythema.  Psychiatric: She has a normal mood and affect. Her behavior is normal.     ED Treatments / Results  Labs (all labs ordered are listed, but only abnormal results are displayed) Labs Reviewed  BASIC METABOLIC PANEL - Abnormal; Notable for the following components:      Result Value   CO2 19 (*)    All other components within normal limits  CBC - Abnormal; Notable for the following components:   RBC 3.85 (*)    Hemoglobin 11.0 (*)    HCT 33.6 (*)    Platelets 420 (*)    All other components within normal limits  HEPATIC FUNCTION PANEL  DIFFERENTIAL  I-STAT TROPONIN, ED    EKG EKG Interpretation  Date/Time:  Monday July 31 2017 20:29:45 EDT Ventricular Rate:  102 PR Interval:    QRS Duration: 80 QT Interval:  341 QTC Calculation: 445 R Axis:   30 Text Interpretation:  Sinus tachycardia Probable left atrial enlargement Low voltage, extremity leads Confirmed by Milton Ferguson [phone]) on 07/31/2017 9:01:21 PM   Radiology Dg Chest Portable 1 View  Result Date: 07/31/2017 CLINICAL DATA:  57 y/o F; chest pain today. Feels throat closing up. EXAM: PORTABLE CHEST 1 VIEW COMPARISON:  03/19/2017 chest radiograph. FINDINGS: Stable heart size and mediastinal contours are within normal limits. Both lungs are clear. The visualized skeletal structures are unremarkable. IMPRESSION: No active disease. Electronically Signed   By: Kristine Garbe M.D.   On: 07/31/2017 20:56    Procedures Procedures (including critical care time)  Medications Ordered in ED Medications  nitroGLYCERIN (NITROSTAT) SL tablet 0.4 mg (0.4  mg Sublingual  Given 07/31/17 2201)  morphine 2 MG/ML injection 2 mg (2 mg Intravenous Given 07/31/17 2135)     Initial Impression / Assessment and Plan / ED Course  I have reviewed the triage vital signs and the nursing notes.  Pertinent labs & imaging results that were available during my care of the patient were reviewed by me and considered in my medical decision making (see chart for details).     Patient with chest pain without EKG changes or abnormal troponin.  I spoke with cardiology and it was felt the patient could be admitted to [facility].  If she has a bump in her troponin then cardiology needs to be called back and the patient needs to be started on heparin  Final Clinical Impressions(s) / ED Diagnoses   Final diagnoses:  Nonspecific chest pain    ED Discharge Orders    None       Milton Ferguson, MD 07/31/17 2209

## 2017-07-31 NOTE — Progress Notes (Signed)
Please call to let patient know that the scan results did not show any change in the adrenal glands, no further evaluation needed

## 2017-08-01 ENCOUNTER — Encounter (HOSPITAL_COMMUNITY): Payer: Self-pay

## 2017-08-01 ENCOUNTER — Telehealth: Payer: Self-pay | Admitting: Cardiovascular Disease

## 2017-08-01 DIAGNOSIS — I5042 Chronic combined systolic (congestive) and diastolic (congestive) heart failure: Secondary | ICD-10-CM | POA: Diagnosis not present

## 2017-08-01 DIAGNOSIS — I259 Chronic ischemic heart disease, unspecified: Secondary | ICD-10-CM

## 2017-08-01 DIAGNOSIS — K219 Gastro-esophageal reflux disease without esophagitis: Secondary | ICD-10-CM | POA: Diagnosis not present

## 2017-08-01 DIAGNOSIS — I10 Essential (primary) hypertension: Secondary | ICD-10-CM

## 2017-08-01 DIAGNOSIS — R0789 Other chest pain: Secondary | ICD-10-CM

## 2017-08-01 DIAGNOSIS — I2510 Atherosclerotic heart disease of native coronary artery without angina pectoris: Secondary | ICD-10-CM | POA: Diagnosis not present

## 2017-08-01 DIAGNOSIS — E782 Mixed hyperlipidemia: Secondary | ICD-10-CM

## 2017-08-01 LAB — TROPONIN I
Troponin I: <0.03 ng/mL (ref <0.03)
Troponin I: <0.03 ng/mL (ref <0.03)
Troponin I: <0.03 ng/mL (ref <0.03)

## 2017-08-01 MED ORDER — PANTOPRAZOLE SODIUM 40 MG PO TBEC: 40.0000 mg | DELAYED_RELEASE_TABLET | Freq: Every day | ORAL | 1 refills | Status: DC

## 2017-08-01 MED ORDER — FAMOTIDINE 20 MG PO TABS: 40.0000 mg | ORAL_TABLET | Freq: Every day | ORAL | Status: DC

## 2017-08-01 NOTE — Progress Notes (Signed)
Discharge instructions read to patient.  Patient verbalized understanding of all instructions. Discharged to home with family. 

## 2017-08-01 NOTE — Discharge Summary (Signed)
Physician Discharge Summary  Erin [name] ZSW:[id number] DOB: 07/20/1960 DOA: 07/31/2017  PCP: Maurice Small, MD  Admit date: 07/31/2017 Discharge date: 08/01/2017  Time spent: 35 minutes  Recommendations for Outpatient Follow-up:  1. Repeat basic metabolic panel to follow electrolytes and renal function 2. Please follow complete resolution of patient discomfort and if needed further adjust antiacid regimen. 3. Patient will follow up with cardiology service as an outpatient.    Discharge Diagnoses:  Principal Problem:   Chest pain Active Problems:   Essential hypertension   Mixed hyperlipidemia   GERD (gastroesophageal reflux disease)   CAD (coronary artery disease), native coronary artery   Normocytic anemia   Chronic combined systolic and diastolic CHF (congestive heart failure) ([place])   Discharge Condition: Stable and improved.  Patient discharged home currently without any chest discomfort and back to her baseline.  She will follow-up with her cardiologist in about 2 weeks and also with PCP as an outpatient.  Diet recommendation: heart healthy diet   Filed Weights   07/31/17 2031 08/01/17 0811  Weight: 56.2 kg (124 lb) 55.8 kg (123 lb 0.3 oz)    History of present illness:  As per H&P written by Dr. Tennis Must on 07/31/17. 57 y.o. female with medical history significant of anxiety, basal cell CA of forehead, essential hypertension, GERD, chronic systolic heart failure, history of pneumonia, hyperlipidemia, thrombocytosis, tobacco abuse, mitral regurgitation, CAD, NSTEMI, history of stent placement x2 who is coming to the emergency department complaining of chest pain.  Per patient, around 1600, just after she got back from work she felt an intense pressure on her chest.  She felt like something very heavy was sitting on her chest.  This pressure was radiated to her back, without associated with dyspnea, diaphoresis, nausea, but no emesis and lying down made it better.  She  mentions that she had a chest pain episode similar to these, but milder in intensity in December when her granddaughter was killed.  She denies PND, orthopnea or recent pitting edema of the lower extremities.  No fever, no chills, no sore throat, no cough, wheezing, hemoptysis, abdominal pain, diarrhea, constipation, melena or hematochezia.  She denies dysuria, frequency or hematuria.  Denies blurred vision, heat or cold intolerance.  She denies skin rashes or pruritus  Hospital Course:  1-chest pain: Heart score of 4.  Atypical description and most likely noncardiac in etiology. -Patient with negative troponins x3, no acute ischemic changes on EKG or telemetry and currently chest pain-free. -Patient presentation in the setting of acute vomiting after the use of contrast media for an outpatient CT over the weekend. -Most likely secondary to reflux/gastritis/esophagitis. -Patient discharged on Protonix 40 mg daily -Resume CAD home medication regimen and arrange follow-up with cardiologist as an outpatient.  2-chronic combined systolic and diastolic heart failure -Compensated -Last ejection fraction 35-40% by echo in June 2018 -Continue prior to admission Lasix 40 mg daily, Toprol XL 25 mg daily and Entresto 24-26 twice a day. -Patient instructed to follow low-sodium diet and to watch her weight on daily basis.  3-hypertension -Blood pressure stable -Continue home antihypertensive regimen -Instructed to follow low-sodium diet.  4-Mixed Hyperlipidemia  -Resume statins -advise to follow heart healthy diet   5-mitral regurgitation -Documented as moderate by most recent echo  -Continue outpatient follow-up with cardiology service.  6-GERD -As mentioned above with recent episode of nausea and vomiting following contrast media for outpatient CT scan and most likely had exacerbate condition. -Patient will continue using Pepcid at  bedtime and will be discharged on Protonix on daily basis for  better control.  Procedures:  See below for x-ray reports.  Consultations:  Cardiology service  Discharge Exam: Vitals:   08/01/17 0600 08/01/17 0811  BP: (!) 88/61 109/74  Pulse: 64 67  Resp: 15 16  Temp:  98 F (36.7 C)  SpO2: 98% 100%   General: Pleasant, Caucasian female appearing in NAD Psych: Normal affect. Neuro: Alert and oriented X 3. Moves all extremities spontaneously. HEENT: Normal           Neck: Supple without bruits or JVD. Lungs:  Resp regular and unlabored, CTA without wheezing or rales. Heart: RRR no s3, s4, 2/6 holosystolic murmur along Apex.  Abdomen: Soft, non-tender, non-distended, BS + x 4.  Extremities: No clubbing, cyanosis or edema. DP/PT/Radials 2+ and equal bilaterally  Discharge Instructions   Discharge Instructions    (HEART FAILURE PATIENTS) Call MD:  Anytime you have any of the following symptoms: 1) 3 pound weight gain in 24 hours or 5 pounds in 1 week 2) shortness of breath, with or without a dry hacking cough 3) swelling in the hands, feet or stomach 4) if you have to sleep on extra pillows at night in order to breathe.   Complete by:  As directed    Diet - low sodium heart healthy   Complete by:  As directed    Discharge instructions   Complete by:  As directed    Keep yourself well-hydrated Take medications as prescribed Check your weight on daily basis and follow low-sodium diet Follow-up as instructed with cardiologist after discharge Arrange follow-up with PCP in 2 weeks for hospital follow-up.     Allergies as of 08/01/2017      Reactions   Tape Other (See Comments)   PEELS SKIN OFF  (PAPER TAPE IS FINE)      Medication List    TAKE these medications   ALPRAZolam 1 MG tablet Commonly known as:  XANAX Take 0.5-1 mg by mouth at bedtime.   aspirin EC 81 MG tablet Take 81 mg by mouth every evening.   atorvastatin 80 MG tablet Commonly known as:  LIPITOR Take 80 mg by mouth daily.   calcium carbonate 500 MG chewable  tablet Commonly known as:  TUMS - dosed in mg elemental calcium Chew 500 mg by mouth 2 (two) times daily as needed for indigestion or heartburn.   ezetimibe 10 MG tablet Commonly known as:  ZETIA Take 1 tablet (10 mg total) by mouth daily.   famotidine 20 MG tablet Commonly known as:  PEPCID Take 2 tablets (40 mg total) by mouth at bedtime. What changed:    how much to take  when to take this   furosemide 20 MG tablet Commonly known as:  LASIX TAKE 2 TABLETS BY MOUTH ONCE DAILY   KLOR-CON M20 20 MEQ tablet Generic drug:  potassium chloride SA TAKE 1 TABLET BY MOUTH ONCE DAILY   metoprolol succinate 25 MG 24 hr tablet Commonly known as:  TOPROL XL Take 1 tablet (25 mg total) by mouth daily.   nitroGLYCERIN 0.4 MG SL tablet Commonly known as:  NITROSTAT DISSOLVE ONE TABLET UNDER THE TONGUE EVERY 5 MINUTES AS NEEDED FOR CHEST PAIN.  DO NOT EXCEED A TOTAL OF 3 DOSES IN 15 MINUTES   pantoprazole 40 MG tablet Commonly known as:  PROTONIX Take 1 tablet (40 mg total) by mouth daily. Start taking on:  08/02/2017   RANEXA 500 MG 12  hr tablet Generic drug:  ranolazine TAKE 1 TABLET BY MOUTH TWICE DAILY   sacubitril-valsartan 24-26 MG Commonly known as:  ENTRESTO Take 1 tablet by mouth 2 (two) times daily.      Allergies  Allergen Reactions  . Tape Other (See Comments)    PEELS SKIN OFF  (PAPER TAPE IS FINE)   Follow-up Information    Erma Heritage, PA-C Follow up on 08/18/2017.   Specialties:  Physician Assistant, Cardiology Why:  [place] on 08/18/2017 at 3:30 PM with Bernerd Pho, PA-C (works with Dr. Bronson Ing).  Contact information: [address] [place] [address] [phone]        Maurice Small, MD. Schedule an appointment as soon as possible for a visit in 2 week(s).   Specialty:  Family Medicine Contact information: [place] [address] [address] [phone]        Herminio Commons, MD .    Specialty:  Cardiology Contact information: [place] [address] [phone]           The results of significant diagnostics from this hospitalization (including imaging, microbiology, ancillary and laboratory) are listed below for reference.    Significant Diagnostic Studies: Dg Chest Portable 1 View  Result Date: 07/31/2017 CLINICAL DATA:  57 y/o F; chest pain today. Feels throat closing up. EXAM: PORTABLE CHEST 1 VIEW COMPARISON:  03/19/2017 chest radiograph. FINDINGS: Stable heart size and mediastinal contours are within normal limits. Both lungs are clear. The visualized skeletal structures are unremarkable. IMPRESSION: No active disease. Electronically Signed   By: Kristine Garbe M.D.   On: 07/31/2017 20:56   Ct Adrenal Abdomen Wo Contrast  Result Date: 07/29/2017 CLINICAL DATA:  Adrenal nodule. EXAM: CT ABDOMEN WITHOUT CONTRAST TECHNIQUE: Multidetector CT imaging of the abdomen was performed following the standard protocol without IV contrast. COMPARISON:  12/15/2016. FINDINGS: Lower chest: Coronary artery calcification. Heart is at the upper limits of normal in size. Small amount of pericardial fluid is likely physiologic. No pleural effusion. Lung bases are clear. Distal esophagus is grossly unremarkable. Hepatobiliary: Subcentimeter low-attenuation lesion in the liver is too small to characterize. Liver is otherwise unremarkable. Cholecystectomy. No biliary ductal dilatation. Pancreas: Negative. Spleen: Negative. Adrenals/Urinary Tract: Fluid density lesion in the right adrenal gland measures 1.6 cm. Fluid density lesion in the left adrenal gland measures 2.7 cm. Both lesions are stable in size. Non rotated kidneys bilaterally. Stomach/Bowel: Stomach and visualized portions of the small bowel and colon are unremarkable. Vascular/Lymphatic: Atherosclerotic calcification of the arterial vasculature without abdominal aortic aneurysm. No pathologically enlarged  lymph nodes. Other: None. Musculoskeletal: No worrisome lytic or sclerotic lesions. IMPRESSION: 1. Bilateral adrenal adenomas. 2. Aortic atherosclerosis (ICD10-170.0). Coronary artery calcification. Electronically Signed   By: Lorin Picket M.D.   On: 07/29/2017 17:02   Labs: Basic Metabolic Panel: Recent Labs  Lab 07/31/17 2114  NA 137  K 3.6  CL 106  CO2 19*  GLUCOSE 96  BUN 20  CREATININE 0.99  CALCIUM 9.7   Liver Function Tests: Recent Labs  Lab 07/31/17 2114  AST 19  ALT 15  ALKPHOS 61  BILITOT 1.1  PROT 7.0  ALBUMIN 3.9   CBC: Recent Labs  Lab 07/31/17 2114  WBC 8.1  NEUTROABS 4.5  HGB 11.0*  HCT 33.6*  MCV 87.3  PLT 420*   Cardiac Enzymes: Recent Labs  Lab 07/31/17 2344 08/01/17 0541  TROPONINI <0.03 <0.03   BNP: BNP (last 3 results) Recent  Labs    08/04/16 0250 09/04/16 0522 10/10/16 2230  BNP 332.0* 143.0* 229.0*    Signed:  Barton Dubois MD.  [facility] 08/01/2017, 12:48 PM

## 2017-08-01 NOTE — Consult Note (Addendum)
Cardiology Consult    Patient ID: Erin [name]; [id number]; 1961/04/23   Admit date: 07/31/2017 Date of Consult: 08/01/2017  Primary Care Provider: Maurice Small, MD Primary Cardiologist: Kate Sable, MD   Patient Profile    Erin [name] is a 57 y.o. female with past medical history of CAD (s/p DES to LCx with staged PCI/DESx2 to RCA/PDA in 03/2016, NST in 03/2017 showing large scar with minimal peri-infarct ischemia), chronic combined systolic and diastolic CHF (EF 37-85% by echo in 10/2016), HTN, HLD, and moderate MR who is being seen today for the evaluation of chest pain at the request of Dr. Olevia Bowens.   History of Present Illness    [name] was recently evaluated by Dr. Bronson Ing in 06/2017 and denied any recent chest pain at that time. Did report having pain along her right jaw. She was continued on ASA, Lipitor, Ranexa, Toprol-XL, and Entresto with Plavix being discontinued.   She presented to [facility] ED on 07/31/2017 for evaluation of chest pain which was occurring throughout the day. She reports undergoing a CT Scan last weekend and having to consume contrast for the pictures. Following this, she developed associated nausea and vomiting with decreased appetite. Throughout the day on 3/25, she felt "weird" while at work. Noted pressure along her chest throughout the day and felt like her throat was swelling. Upon returning home and sitting down to rest, her chest pressure worsened and this alarmed her, prompting her to come to the ED for further evaluation. Pain was not exacerbated with exertion and does not resemble her prior angina. No recent dyspnea on exertion, orthopnea, PND, or lower extremity edema. Says she felt very anxious over the past few days as she ran out of Xanax and has been unable to obtain refills from her PCP.   Since arrival to the ED and overnight, she has not experienced recurrent symptoms. Initial labs showed WBC 8.1, Hgb 11.0, platelets 420, Na+ 137,  K+ 3.6, and creatinine 0.99. Initial and cyclic troponin values have been negative. CXR shows no active disease. EKG shows sinus tachycardia, HR 102, with no acute ST or T-wave changes when compared to prior tracings.    Past Medical History:  Diagnosis Date  . Anxiety state 03/25/2016  . Basal cell carcinoma of forehead   . Coronary artery disease    a. 03/11/16 PCI with DES-->Prox/Mid Cx;  b. 03/14/16 PCI with DES x2-->RCA, EF 30-35%.  . Essential hypertension   . GERD (gastroesophageal reflux disease)   . HFrEF (heart failure with reduced ejection fraction) ([place])    a. 10/2016 Echo: EF 35-40%, Gr1 DD, mild focal basal septal hypertrophy, basal inflat, mid inflat, basal antlat AK. Mid infept/inf/antlat, apical lateral sev HK. Mod MR. mildly reduced RV fxn. Mild TR.  [name] Kitchen History of pneumonia   . Hyperlipidemia   . Ischemic cardiomyopathy    a. 10/2016 Echo: EF 35-40%, Gr1 DD.  [name] Kitchen Mitral regurgitation   . NSTEMI (non-ST elevated myocardial infarction) ([place]) 03/10/2016  . Squamous cell cancer of skin of nose   . Thrombocytosis ([place]) 03/26/2016  . Tobacco abuse   . Wears dentures   . Wears glasses     Past Surgical History:  Procedure Laterality Date  . APPENDECTOMY    . CARDIAC CATHETERIZATION N/A 03/11/2016   Procedure: Left Heart Cath and Coronary Angiography;  Surgeon: Leonie Man, MD;  Location: [place] [facility];  Service: Cardiovascular;  Laterality: N/A;  . CARDIAC CATHETERIZATION N/A 03/11/2016  Procedure: Coronary Stent Intervention;  Surgeon: Leonie Man, MD;  Location: [place] [facility];  Service: Cardiovascular;  Laterality: N/A;  . CARDIAC CATHETERIZATION N/A 03/14/2016   Procedure: Coronary Stent Intervention;  Surgeon: Peter M Martinique, MD;  Location: [place] [facility];  Service: Cardiovascular;  Laterality: N/A;  . CHOLECYSTECTOMY OPEN  1984  . CORONARY ANGIOPLASTY WITH STENT PLACEMENT  03/14/2016  . FINGER ARTHROPLASTY Left 05/14/2013   Procedure: LEFT THUMB CARPAL  METACARPAL ARTHROPLASTY;  Surgeon: Tennis Must, MD;  Location: [place];  Service: Orthopedics;  Laterality: Left;  . IR ANGIO INTRA EXTRACRAN SEL COM CAROTID INNOMINATE BILAT MOD SED  01/05/2017  . IR ANGIO VERTEBRAL SEL VERTEBRAL BILAT MOD SED  01/05/2017  . IR RADIOLOGIST EVAL & MGMT  12/30/2016  . TUBAL LIGATION  1987  . VAGINAL HYSTERECTOMY  2009     Home Medications:  Prior to Admission medications   Medication Sig Start Date End Date Taking? Authorizing Provider  ALPRAZolam Duanne Moron) 1 MG tablet Take 0.5-1 mg by mouth at bedtime.    Yes [provider]  aspirin EC 81 MG tablet Take 81 mg by mouth every evening.    Yes [provider]  atorvastatin (LIPITOR) 80 MG tablet Take 80 mg by mouth daily.   Yes [provider]  calcium carbonate (TUMS - DOSED IN MG ELEMENTAL CALCIUM) 500 MG chewable tablet Chew 500 mg by mouth 2 (two) times daily as needed for indigestion or heartburn.    Yes [provider]  ezetimibe (ZETIA) 10 MG tablet Take 1 tablet (10 mg total) by mouth daily. 07/14/17  Yes Herminio Commons, MD  famotidine (PEPCID) 20 MG tablet Take 20 mg by mouth 2 (two) times daily.   Yes [provider]  furosemide (LASIX) 20 MG tablet TAKE 2 TABLETS BY MOUTH ONCE DAILY 02/07/17  Yes Herminio Commons, MD  KLOR-CON M20 20 MEQ tablet TAKE 1 TABLET BY MOUTH ONCE DAILY 04/11/17  Yes Herminio Commons, MD  metoprolol succinate (TOPROL XL) 25 MG 24 hr tablet Take 1 tablet (25 mg total) by mouth daily. 12/16/16  Yes Herminio Commons, MD  nitroGLYCERIN (NITROSTAT) 0.4 MG SL tablet DISSOLVE ONE TABLET UNDER THE TONGUE EVERY 5 MINUTES AS NEEDED FOR CHEST PAIN.  DO NOT EXCEED A TOTAL OF 3 DOSES IN 15 MINUTES 09/07/16  Yes Josue Hector, MD  RANEXA 500 MG 12 hr tablet TAKE 1 TABLET BY MOUTH TWICE DAILY 04/10/17  Yes Herminio Commons, MD  sacubitril-valsartan (ENTRESTO) 24-26 MG Take 1 tablet by mouth 2 (two) times daily. 12/16/16   Yes Herminio Commons, MD    Inpatient Medications: Scheduled Meds: . ALPRAZolam  0.5-1 mg Oral QHS  . aspirin EC  81 mg Oral QPM  . atorvastatin  80 mg Oral Daily  . enoxaparin (LOVENOX) injection  40 mg Subcutaneous Q24H  . ezetimibe  10 mg Oral Daily  . famotidine  20 mg Oral BID  . furosemide  40 mg Oral Daily  . metoprolol succinate  25 mg Oral Daily  . pantoprazole  40 mg Oral Daily  . potassium chloride SA  20 mEq Oral Daily  . ranolazine  500 mg Oral BID  . sacubitril-valsartan  1 tablet Oral BID   Continuous Infusions:  PRN Meds: acetaminophen **OR** acetaminophen, ALPRAZolam, calcium carbonate, nitroGLYCERIN, nitroGLYCERIN, ondansetron **OR** ondansetron (ZOFRAN) IV  Allergies:    Allergies  Allergen Reactions  . Tape Other (See Comments)  PEELS SKIN OFF  (PAPER TAPE IS FINE)    Social History:   Social History   Socioeconomic History  . Marital status: Married    Spouse name: Not on file  . Number of children: Not on file  . Years of education: Not on file  . Highest education level: Not on file  Occupational History  . Not on file  Social Needs  . Financial resource strain: Not on file  . Food insecurity:    Worry: Not on file    Inability: Not on file  . Transportation needs:    Medical: Not on file    Non-medical: Not on file  Tobacco Use  . Smoking status: Current Every Day Smoker    Years: 15.00    Types: Cigarettes  . Smokeless tobacco: Never Used  . Tobacco comment: 3 cigarettes daily   Substance and Sexual Activity  . Alcohol use: Yes    Alcohol/week: 3.6 oz    Types: 6 Cans of beer per week    Comment: occ  . Drug use: Not Currently    Types: Marijuana, MDMA (Ecstacy)    Comment: former  . Sexual activity: Yes    Birth control/protection: Surgical  Lifestyle  . Physical activity:    Days per week: Not on file    Minutes per session: Not on file  . Stress: Not on file  Relationships  . Social connections:    Talks on  phone: Not on file    Gets together: Not on file    Attends religious service: Not on file    Active member of club or organization: Not on file    Attends meetings of clubs or organizations: Not on file    Relationship status: Not on file  . Intimate partner violence:    Fear of current or ex partner: Not on file    Emotionally abused: Not on file    Physically abused: Not on file    Forced sexual activity: Not on file  Other Topics Concern  . Not on file  Social History Narrative   Lives with husband in [place] in a one story home with a basement.  Has 4 children.  Works as a Quarry manager.  Education: [employer] school.      Family History:    Family History  Problem Relation Age of Onset  . Diabetes Father   . Hypertension Father   . CAD Father   . Stroke Mother   . Hypertension Mother   . Diabetes Mother   . Heart failure Other       Review of Systems    General:  No chills, fever, night sweats or weight changes.  Cardiovascular:  No dyspnea on exertion, edema, orthopnea, palpitations, paroxysmal nocturnal dyspnea. Positive for chest pain.  Dermatological: No rash, lesions/masses Respiratory: No cough, dyspnea Urologic: No hematuria, dysuria Abdominal:   No nausea, vomiting, diarrhea, bright red blood per rectum, melena, or hematemesis Neurologic:  No visual changes, wkns, changes in mental status. All other systems reviewed and are otherwise negative except as noted above.  Physical Exam/Data    Vitals:   08/01/17 0500 08/01/17 0530 08/01/17 0600 08/01/17 0811  BP: 100/71 96/60 (!) 88/61 109/74  Pulse: 68 (!) 58 64 67  Resp: 14 16 15 16   Temp:    98 F (36.7 C)  TempSrc:    Oral  SpO2: 97% 99% 98% 100%  Weight:    123 lb 0.3 oz (55.8 kg)  Height:  5' (1.524 m)   No intake or output data in the 24 hours ending 08/01/17 0821 Filed Weights   07/31/17 2031 08/01/17 0811  Weight: 124 lb (56.2 kg) 123 lb 0.3 oz (55.8 kg)   Body mass index is 24.03 kg/m.   General:  Pleasant, Caucasian female appearing in NAD Psych: Normal affect. Neuro: Alert and oriented X 3. Moves all extremities spontaneously. HEENT: Normal  Neck: Supple without bruits or JVD. Lungs:  Resp regular and unlabored, CTA without wheezing or rales. Heart: RRR no s3, s4, 2/6 holosystolic murmur along Apex.  Abdomen: Soft, non-tender, non-distended, BS + x 4.  Extremities: No clubbing, cyanosis or edema. DP/PT/Radials 2+ and equal bilaterally.   EKG:  The EKG was personally reviewed and demonstrates: Sinus tachycardia, HR 102, with no acute ST or T-wave changes when compared to prior tracings.    Labs/Studies     Relevant CV Studies:  Cardiac Catheterization: 03/2016  Prox-Mid Cx lesion, 100 %stenosed.  A STENT SYNERGY DES 3X12 drug eluting stent was successfully placed, and overlaps previously placed stent.  Post intervention, there is a 0% residual stenosis.  Dist RCA lesion, 80 %stenosed. - Would consider treatment in a staged fashion, could either be done during this hospitalization or as an outpatient.  The left ventricular ejection fraction is 45-50% by visual estimate.  There is severe (4+) mitral regurgitation.  LV end diastolic pressure is severely elevated.    Pathophysiologically, this is equivalent to a non-ST elevation MI, however with no EKG changes, is technically non-STEMI.  She will be admitted to [facility] post procedure unit/stepdown. Was chest pain-free upon completion of the procedure.  Plan:  TR band removal per protocol  Continue Angiomax until current bag completed  Nitroglycerin infusion weaned from 40 g/min down to 20. Would continue to wean overnight.  1dose IV Lasix 40 mg for elevated LVEDP and MR  Order home dose metoprolol for this evening, and start ACE inhibitor tomorrow.  Dual antiplatelet therapy for minimum of 3 months, could potentially consider stopping aspirin after 3 months  We'll need 2-D echocardiogram to clarify extent of  MR  Based on the existing lesion, and MR, would not fast-track discharge  Coronary Stent Intervention: 03/2016  Mid Cx lesion, 0 %stenosed.  A drug eluting .  A STENT PROMUS PREM MR 2.25X16 drug eluting stent was successfully placed.  A STENT PROMUS PREM MR 2.25X8 drug-eluting stent was successfully placed.  Dist RCA lesion, 60 %stenosed.  Post intervention, there is a 0% residual stenosis.  Post Atrio lesion, 0 %stenosed.  Post intervention, there is a 0% residual stenosis.  A stent was successfully placed.   1. Successful Stenting of the distal RCA/PDA with DES x 2.  Plan: DAPT for one year. Risk factor modification.  Echocardiogram: 10/2016 Study Conclusions  - Left ventricle: The cavity size was normal. Systolic function was   moderately reduced. The estimated ejection fraction was in the   range of 35% to 40%. Doppler parameters are consistent with   abnormal left ventricular relaxation (grade 1 diastolic   dysfunction). Doppler parameters are consistent with high   ventricular filling pressure. Mild focal basal septal   hypertrophy. - Regional wall motion abnormality: Akinesis and scarring of the   basal inferolateral myocardium; akinesis of the mid inferolateral   and basal anterolateral myocardium; severe hypokinesis of the mid   inferoseptal, mid inferior, mid anterolateral, and apical lateral   myocardium. - Mitral valve: There was mild to moderate regurgitation. - Right  ventricle: Systolic function was mildly reduced. - Tricuspid valve: There was mild regurgitation. - Pericardium, extracardiac: There was no pericardial effusion.  Laboratory Data:  Chemistry Recent Labs  Lab 07/31/17 2114  NA 137  K 3.6  CL 106  CO2 19*  GLUCOSE 96  BUN 20  CREATININE 0.99  CALCIUM 9.7  GFRNONAA >60  GFRAA >60  ANIONGAP 12    Recent Labs  Lab 07/31/17 2114  PROT 7.0  ALBUMIN 3.9  AST 19  ALT 15  ALKPHOS 61  BILITOT 1.1   Hematology Recent Labs    Lab 07/31/17 2114  WBC 8.1  RBC 3.85*  HGB 11.0*  HCT 33.6*  MCV 87.3  MCH 28.6  MCHC 32.7  RDW 14.4  PLT 420*   Cardiac Enzymes Recent Labs  Lab 07/31/17 2344 08/01/17 0541  TROPONINI <0.03 <0.03    Recent Labs  Lab 07/31/17 2117  TROPIPOC 0.00    BNPNo results for input(s): BNP, PROBNP in the last 168 hours.  DDimer No results for input(s): DDIMER in the last 168 hours.  Radiology/Studies:  Dg Chest Portable 1 View  Result Date: 07/31/2017 CLINICAL DATA:  57 y/o F; chest pain today. Feels throat closing up. EXAM: PORTABLE CHEST 1 VIEW COMPARISON:  03/19/2017 chest radiograph. FINDINGS: Stable heart size and mediastinal contours are within normal limits. Both lungs are clear. The visualized skeletal structures are unremarkable. IMPRESSION: No active disease. Electronically Signed   By: Kristine Garbe M.D.   On: 07/31/2017 20:56   Ct Adrenal Abdomen Wo Contrast  Result Date: 07/29/2017 CLINICAL DATA:  Adrenal nodule. EXAM: CT ABDOMEN WITHOUT CONTRAST TECHNIQUE: Multidetector CT imaging of the abdomen was performed following the standard protocol without IV contrast. COMPARISON:  12/15/2016. FINDINGS: Lower chest: Coronary artery calcification. Heart is at the upper limits of normal in size. Small amount of pericardial fluid is likely physiologic. No pleural effusion. Lung bases are clear. Distal esophagus is grossly unremarkable. Hepatobiliary: Subcentimeter low-attenuation lesion in the liver is too small to characterize. Liver is otherwise unremarkable. Cholecystectomy. No biliary ductal dilatation. Pancreas: Negative. Spleen: Negative. Adrenals/Urinary Tract: Fluid density lesion in the right adrenal gland measures 1.6 cm. Fluid density lesion in the left adrenal gland measures 2.7 cm. Both lesions are stable in size. Non rotated kidneys bilaterally. Stomach/Bowel: Stomach and visualized portions of the small bowel and colon are unremarkable. Vascular/Lymphatic:  Atherosclerotic calcification of the arterial vasculature without abdominal aortic aneurysm. No pathologically enlarged lymph nodes. Other: None. Musculoskeletal: No worrisome lytic or sclerotic lesions. IMPRESSION: 1. Bilateral adrenal adenomas. 2. Aortic atherosclerosis (ICD10-170.0). Coronary artery calcification. Electronically Signed   By: Lorin Picket M.D.   On: 07/29/2017 17:02     Assessment & Plan    1. Atypical Chest Pain - presented to the ED for evaluation of nausea, vomiting, chest pressure, and feeling like her throat was swelling. Her chest pressure was present throughout the day and was actually exacerbated with rest. Did not resemble her prior angina. Reports feeling very anxious over the past few days as she ran out of Xanax and has been unable to obtain refills from her PCP.  - Initial and cyclic troponin values have been negative. EKG shows sinus tachycardia, HR 102, with no acute ST or T-wave changes when compared to prior tracings.  - her pain has since resolved since admission and she reports overall feeling back to her normal state of health. Overall, her presentation seems atypical for a cardiac etiology. She has ruled-out for ACS. Will  obtain a repeat EKG this morning to assess for any changes. I encouraged her to ambulate around the unit to see if any symptoms represent. If next troponin remains negative and EKG shows no changes, would not anticipate further ischemic evaluation this admission. Dr. Harl Bowie to see the patient later today.   2. CAD - s/p DES to LCx with staged PCI/DESx2 to RCA/PDA in 03/2016 with recent NST in 03/2017 showing large scar with minimal peri-infarct ischemia.  - her current presentation as outlined above seems atypical for a cardiac etiology.  - continue PTA ASA, Atorvastatin, Zetia, BB, and Ranexa.   3. Chronic Combined Systolic and Diastolic CHF - EF 83-66% by echo in 10/2016. She denies any recent dyspnea on exertion, orthopnea, PND, or  lower extremity edema. - continue PTA Lasix 40mg  daily, Toprol-XL 25mg  daily, and Entresto 24-26mg  BID. Reports SBP is usually in the 110's when checked at home. Soft BP does not allow for further titration of her current medication regimen.   4. HTN -  BP variable at 80/47 - 138/94 since admission. Improved to 109/74 this AM. Soft BP readings occurred after receiving IV Morphine and SL NTG in the ED.  - continue current medication regimen at this time. She should continue to follow BP in the outpatient setting.   5. HLD - remains on Atorvastatin 80mg  daily and Zetia 10mg  daily. Goal LDL < 70 with known CAD.   6. Mitral Regurgitation - documented as severe by time of cath in 03/2016 with echo at that time showing mild MR. Most recent echo in 10/2016 showed mild to moderate MR.  - continue to follow as an outpatient.     For questions or updates, please contact [place] Please consult [email or web address] for contact info under Cardiology/STEMI.  Signed, Erma Heritage, PA-C 08/01/2017, 8:21 AM Pager: [phone]  Attedning note Patient seen and discussed with PA Ahmed Prima, I agree with her documentation above. 57 yo female history of CAD with prior stenting, chronic systolic HF LVEF 35-46%, mild to mod MR admitted with chest pain.  She reports feeling well until Saturday when she had a CT scan done where she had to drink oral contrast. After that felt nauseous throughout the day. Sunday throughout several times, and developed some intermittent chest pressure. Monday continued chest pressure, constant chest pain from 4pm to 1230AM without relief.    K 3.6 Cr 0.99 WBC 8.1 Hgb 11 Plt 420  Trop neg x 3 CXR no acute process EKG SR no ischemic changes 10/2016 echo LVEF 56-81%, grade I diastolic dysfunction,mild to mod MR 03/2017 nuclear stress: scar, minimal peri-infarct ischemia.   Atypical chest pain as described above, negative workup for ACS. At this time no plans for further  cardiac testing, continue outpatient cardiac regiment. Ok for discharge today,we will arrange outpatient f/u in 2-3 weeks   Carlyle Dolly MD

## 2017-08-03 NOTE — Telephone Encounter (Signed)
I attempted to reach, extension listed is invalid, lines disconnects

## 2017-08-18 ENCOUNTER — Encounter: Payer: Self-pay | Admitting: Student

## 2017-08-18 ENCOUNTER — Ambulatory Visit (INDEPENDENT_AMBULATORY_CARE_PROVIDER_SITE_OTHER): Payer: Managed Care, Other (non HMO) | Admitting: Student

## 2017-08-18 VITALS — BP 116/70 | HR 90 | Ht 60.0 in | Wt 121.0 lb

## 2017-08-18 DIAGNOSIS — I25118 Atherosclerotic heart disease of native coronary artery with other forms of angina pectoris: Secondary | ICD-10-CM | POA: Diagnosis not present

## 2017-08-18 DIAGNOSIS — I10 Essential (primary) hypertension: Secondary | ICD-10-CM | POA: Diagnosis not present

## 2017-08-18 DIAGNOSIS — I5042 Chronic combined systolic (congestive) and diastolic (congestive) heart failure: Secondary | ICD-10-CM

## 2017-08-18 DIAGNOSIS — I340 Nonrheumatic mitral (valve) insufficiency: Secondary | ICD-10-CM | POA: Diagnosis not present

## 2017-08-18 DIAGNOSIS — R21 Rash and other nonspecific skin eruption: Secondary | ICD-10-CM | POA: Diagnosis not present

## 2017-08-18 DIAGNOSIS — E785 Hyperlipidemia, unspecified: Secondary | ICD-10-CM | POA: Diagnosis not present

## 2017-08-18 MED ORDER — ISOSORBIDE MONONITRATE ER 30 MG PO TB24: 30.0000 mg | ORAL_TABLET | Freq: Every day | ORAL | 3 refills | Status: DC

## 2017-08-18 NOTE — Patient Instructions (Signed)
Medication Instructions:  STOP RANEXA   START IMDUR 30 MG DAILY   Labwork: NONE  Testing/Procedures: NONE  Follow-Up: Your physician recommends that you schedule a follow-up appointment in: June 2019  Any Other Special Instructions Will Be Listed Below (If Applicable).     If you need a refill on your cardiac medications before your next appointment, please call your pharmacy.

## 2017-08-18 NOTE — Progress Notes (Signed)
Cardiology Office Note    Date:  08/18/2017   ID:  Flordia, Kassem Mar 21, 1961, MRN [id number]  PCP:  Maurice Small, MD  Cardiologist: Kate Sable, MD    Chief Complaint  Patient presents with  . Hospitalization Follow-up    History of Present Illness:    Erin [name] is a 57 y.o. female with past medical history of CAD (s/p DES to LCx with staged PCI/DESx2 to RCA/PDA in 03/2016, NST in 03/2017 showing large scar with minimal peri-infarct ischemia), chronic combined systolic and diastolic CHF (EF 92-42% by echo in 10/2016), HTN, HLD, and moderate MR who presents to the office today for hospital follow-up.  She was recently admitted to [facility] from 07/31/2017 to 08/01/2017 for evaluation of chest pain. Reported having a recent CT scan during which she consumed contrast and following this, she developed chest discomfort and felt like her throat was swelling. Her pain was not exacerbated with exertion and did not resemble her prior angina. Initial and cyclic troponin values were negative and her EKG showed no acute ischemic changes. It was thought her pain was overall atypical for a cardiac etiology and no further testing was pursued at that time.  In talking with the patient today, she reports overall doing well from a cardiac perspective since her recent hospitalization. She denies any repeat episodes of chest pain and feels like her recent episode was due to frequent nausea and vomiting. She does report being under increased stress as her granddaughter was murdered in 04/2017 and was pregnant at the time. The patient has also gained custody of her 7 grandchildren as two of her daughters are in treatment facilities. She is the main provider and says this has caused significant emotional and financial stress.  She denies any recent dyspnea on exertion, orthopnea, PND, or lower extremity edema. She does not exercise regularly but is active at baseline in caring for her family  members and working as a Quarry manager.  She has noticed a rash along her upper and lower extremities over the past month which is transient. Denies any new detergents or soaps. No medication changes during this timeframe.   Past Medical History:  Diagnosis Date  . Anxiety state 03/25/2016  . Basal cell carcinoma of forehead   . Coronary artery disease    a. 03/11/16 PCI with DES-->Prox/Mid Cx;  b. 03/14/16 PCI with DES x2-->RCA, EF 30-35%.  . Essential hypertension   . GERD (gastroesophageal reflux disease)   . HFrEF (heart failure with reduced ejection fraction) ([place])    a. 10/2016 Echo: EF 35-40%, Gr1 DD, mild focal basal septal hypertrophy, basal inflat, mid inflat, basal antlat AK. Mid infept/inf/antlat, apical lateral sev HK. Mod MR. mildly reduced RV fxn. Mild TR.  [name] Kitchen History of pneumonia   . Hyperlipidemia   . Ischemic cardiomyopathy    a. 10/2016 Echo: EF 35-40%, Gr1 DD.  [name] Kitchen Mitral regurgitation   . NSTEMI (non-ST elevated myocardial infarction) ([place]) 03/10/2016  . Squamous cell cancer of skin of nose   . Thrombocytosis ([place]) 03/26/2016  . Tobacco abuse   . Wears dentures   . Wears glasses     Past Surgical History:  Procedure Laterality Date  . APPENDECTOMY    . CARDIAC CATHETERIZATION N/A 03/11/2016   Procedure: Left Heart Cath and Coronary Angiography;  Surgeon: Leonie Man, MD;  Location: [facility];  Service: Cardiovascular;  Laterality: N/A;  . CARDIAC CATHETERIZATION N/A 03/11/2016   Procedure: Coronary Stent Intervention;  Surgeon: Leonie Man, MD;  Location: [facility];  Service: Cardiovascular;  Laterality: N/A;  . CARDIAC CATHETERIZATION N/A 03/14/2016   Procedure: Coronary Stent Intervention;  Surgeon: Peter M Martinique, MD;  Location: [facility];  Service: Cardiovascular;  Laterality: N/A;  . CHOLECYSTECTOMY OPEN  1984  . CORONARY ANGIOPLASTY WITH STENT PLACEMENT  03/14/2016  . FINGER ARTHROPLASTY Left 05/14/2013   Procedure: LEFT THUMB CARPAL  METACARPAL ARTHROPLASTY;  Surgeon: Tennis Must, MD;  Location: [facility];  Service: Orthopedics;  Laterality: Left;  . IR ANGIO INTRA EXTRACRAN SEL COM CAROTID INNOMINATE BILAT MOD SED  01/05/2017  . IR ANGIO VERTEBRAL SEL VERTEBRAL BILAT MOD SED  01/05/2017  . IR RADIOLOGIST EVAL & MGMT  12/30/2016  . TUBAL LIGATION  1987  . VAGINAL HYSTERECTOMY  2009    Current Medications: Outpatient Medications Prior to Visit  Medication Sig Dispense Refill  . ALPRAZolam (XANAX) 1 MG tablet Take 0.5-1 mg by mouth at bedtime.     [name] Kitchen aspirin EC 81 MG tablet Take 81 mg by mouth every evening.     [name] Kitchen atorvastatin (LIPITOR) 80 MG tablet Take 80 mg by mouth daily.    . calcium carbonate (TUMS - DOSED IN MG ELEMENTAL CALCIUM) 500 MG chewable tablet Chew 500 mg by mouth 2 (two) times daily as needed for indigestion or heartburn.     . ezetimibe (ZETIA) 10 MG tablet Take 1 tablet (10 mg total) by mouth daily. 30 tablet 11  . famotidine (PEPCID) 20 MG tablet Take 2 tablets (40 mg total) by mouth at bedtime.    . furosemide (LASIX) 20 MG tablet TAKE 2 TABLETS BY MOUTH ONCE DAILY 60 tablet 6  . KLOR-CON M20 20 MEQ tablet TAKE 1 TABLET BY MOUTH ONCE DAILY 90 tablet 3  . metoprolol succinate (TOPROL XL) 25 MG 24 hr tablet Take 1 tablet (25 mg total) by mouth daily. 90 tablet 3  . nitroGLYCERIN (NITROSTAT) 0.4 MG SL tablet DISSOLVE ONE TABLET UNDER THE TONGUE EVERY 5 MINUTES AS NEEDED FOR CHEST PAIN.  DO NOT EXCEED A TOTAL OF 3 DOSES IN 15 MINUTES 25 tablet 3  . pantoprazole (PROTONIX) 40 MG tablet Take 1 tablet (40 mg total) by mouth daily. 30 tablet 1  . sacubitril-valsartan (ENTRESTO) 24-26 MG Take 1 tablet by mouth 2 (two) times daily. 180 tablet 3  . RANEXA 500 MG 12 hr tablet TAKE 1 TABLET BY MOUTH TWICE DAILY 60 tablet 6   No facility-administered medications prior to visit.      Allergies:   Tape   Social History   Socioeconomic History  . Marital status: Married    Spouse name: Not on  file  . Number of children: Not on file  . Years of education: Not on file  . Highest education level: Not on file  Occupational History  . Not on file  Social Needs  . Financial resource strain: Not on file  . Food insecurity:    Worry: Not on file    Inability: Not on file  . Transportation needs:    Medical: Not on file    Non-medical: Not on file  Tobacco Use  . Smoking status: Current Every Day Smoker    Years: 15.00    Types: Cigarettes  . Smokeless tobacco: Never Used  . Tobacco comment: 3 cigarettes daily   Substance and Sexual Activity  . Alcohol use: Yes    Alcohol/week: 3.6 oz    Types:  6 Cans of beer per week    Comment: occ  . Drug use: Not Currently    Types: Marijuana, MDMA (Ecstacy)    Comment: former  . Sexual activity: Yes    Birth control/protection: Surgical  Lifestyle  . Physical activity:    Days per week: Not on file    Minutes per session: Not on file  . Stress: Not on file  Relationships  . Social connections:    Talks on phone: Not on file    Gets together: Not on file    Attends religious service: Not on file    Active member of club or organization: Not on file    Attends meetings of clubs or organizations: Not on file    Relationship status: Not on file  Other Topics Concern  . Not on file  Social History Narrative   Lives with husband in [place] in a one story home with a basement.  Has 4 children.  Works as a Quarry manager.  Education: CNA school.      Family History:  The patient's family history includes CAD in her father; Diabetes in her father and mother; Heart failure in her other; Hypertension in her father and mother; Stroke in her mother.   Review of Systems:   Please see the history of present illness.     General:  No chills, fever, night sweats or weight changes.  Cardiovascular:  No chest pain, dyspnea on exertion, edema, orthopnea, palpitations, paroxysmal nocturnal dyspnea. Dermatological: No lesions/masses. Positive for  rash. Respiratory: No cough, dyspnea Urologic: No hematuria, dysuria Abdominal:   No nausea, vomiting, diarrhea, bright red blood per rectum, melena, or hematemesis Neurologic:  No visual changes, wkns, changes in mental status. All other systems reviewed and are otherwise negative except as noted above.   Physical Exam:    VS:  BP 116/70   Pulse 90   Ht 5' (1.524 m)   Wt 121 lb (54.9 kg)   SpO2 99%   BMI 23.63 kg/m    General: Well developed, well nourished Caucasian female appearing in no acute distress. Head: Normocephalic, atraumatic, sclera non-icteric, no xanthomas, nares are without discharge.  Neck: No carotid bruits. JVD not elevated.  Lungs: Respirations regular and unlabored, without wheezes or rales.  Heart: Regular rate and rhythm. No S3 or S4.  No murmur, no rubs, or gallops appreciated. Abdomen: Soft, non-tender, non-distended with normoactive bowel sounds. No hepatomegaly. No rebound/guarding. No obvious abdominal masses. Msk:  Strength and tone appear normal for age. No joint deformities or effusions. Extremities: No clubbing or cyanosis. No edema.  Distal pedal pulses are 2+ bilaterally. Neuro: Alert and oriented X 3. Moves all extremities spontaneously. No focal deficits noted. Psych:  Responds to questions appropriately with a normal affect. Skin: Diffuse discoloration along upper and lower extremities. Blanchable.   Wt Readings from Last 3 Encounters:  08/18/17 121 lb (54.9 kg)  08/01/17 123 lb 0.3 oz (55.8 kg)  06/27/17 125 lb (56.7 kg)     Studies/Labs Reviewed:   EKG:  EKG is not ordered today.   Recent Labs: 10/10/2016: B Natriuretic Peptide 229.0 06/26/2017: TSH 0.60 07/31/2017: ALT 15; BUN 20; Creatinine, Ser 0.99; Hemoglobin 11.0; Platelets 420; Potassium 3.6; Sodium 137   Lipid Panel    Component Value Date/Time   CHOL 179 03/19/2017 0643   TRIG 245 (H) 03/19/2017 0643   HDL 49 03/19/2017 0643   CHOLHDL 3.7 03/19/2017 0643   VLDL 49 (H)  03/19/2017 2951  [place] 81 03/19/2017 0643    Additional studies/ records that were reviewed today include:   Echocardiogram: 10/2016 Study Conclusions  - Left ventricle: The cavity size was normal. Systolic function was   moderately reduced. The estimated ejection fraction was in the   range of 35% to 40%. Doppler parameters are consistent with   abnormal left ventricular relaxation (grade 1 diastolic   dysfunction). Doppler parameters are consistent with high   ventricular filling pressure. Mild focal basal septal   hypertrophy. - Regional wall motion abnormality: Akinesis and scarring of the   basal inferolateral myocardium; akinesis of the mid inferolateral   and basal anterolateral myocardium; severe hypokinesis of the mid   inferoseptal, mid inferior, mid anterolateral, and apical lateral   myocardium. - Mitral valve: There was mild to moderate regurgitation. - Right ventricle: Systolic function was mildly reduced. - Tricuspid valve: There was mild regurgitation. - Pericardium, extracardiac: There was no pericardial effusion.  NST: 03/2017  There was no ST segment deviation noted during stress.  Findings consistent with prior large lateral/inferolateral/inferior/inferoapical infarction with minimal peri-infarct ischemia. There is a small basal septal infarct  This is a high risk study. High risk based on low LVEF and large scar. There is minimal myocardium currently at jeopardy. Consider correlating LVEF with echo.  Nuclear stress EF: 30%.  Assessment:    1. Coronary artery disease of native artery of native heart with stable angina pectoris ([place])   2. Chronic combined systolic and diastolic CHF (congestive heart failure) ([place])   3. Essential hypertension   4. Hyperlipidemia LDL goal <70   5. Mitral valve insufficiency, unspecified etiology      Plan:   In order of problems listed above:  1.  CAD - s/p DES to LCx with staged PCI/DESx2 to RCA/PDA in  03/2016, NST in 03/2017 showing large scar with minimal peri-infarct ischemia. - She denies any repeat episodes of chest pain since her recent admission. Cyclic troponin values were negative at that time and her EKG showed no acute ischemic changes. - She is currently on ASA, statin therapy, Zetia, Toprol-XL, and Ranexa. Is no longer able to afford Ranexa as her co-pay is $67.00 and she is now under increased financial stress due to raising her 7 grandchildren. Will stop Ranexa and switch to Imdur 30mg  daily.   2. Chronic Combined Systolic and Diastolic CHF - EF 69-67% by echo in 10/2016. She denies any recent dyspnea on exertion, orthopnea, PND, or lower extremity edema. Appears euvolemic by examination today. - continue Toprol-XL, Entresto, and Lasix at current dosing. Recent BMET on 07/31/2017 showed stable kidney function with creatinine at 0.99.  3. HTN - BP is well controlled at 116/70 during today's visit. - Continue Toprol-XL 25 mg daily and Entresto 24-26mg  BID.   4. HLD - Followed by PCP. Remains on Atorvastatin 80 mg daily with goal LDL less than 70.  5. Moderate MR - Mild to moderate by most recent echocardiogram in 2018.  Continue to follow.  6. Rash - She does report discoloration along her upper and lower extremities which is transient and changes when exposed to warmer temperatures. This is blanchable and she denies any recent changes in her detergents or soaps. Denies any associated pain or itching. Reviewed her medication list and there have been no recent changes. It is possible that this is stress-induced due to her circumstances as outlined above. I have advised her to follow-up with her PCP if this persists.   Medication Adjustments/Labs and Tests  Ordered: Current medicines are reviewed at length with the patient today.  Concerns regarding medicines are outlined above.  Medication changes, Labs and Tests ordered today are listed in the Patient Instructions below. Patient  Instructions  Medication Instructions:  STOP RANEXA   START IMDUR 30 MG DAILY   Labwork: NONE  Testing/Procedures: NONE  Follow-Up: Your physician recommends that you schedule a follow-up appointment in: June 2019  Any Other Special Instructions Will Be Listed Below (If Applicable).  If you need a refill on your cardiac medications before your next appointment, please call your pharmacy.    Signed, Erma Heritage, PA-C  08/18/2017 5:11 PM    Erin S. [address] [place], [place] [address] Phone: [phone]

## 2017-08-29 ENCOUNTER — Other Ambulatory Visit: Payer: Self-pay | Admitting: Cardiovascular Disease

## 2017-09-01 ENCOUNTER — Other Ambulatory Visit: Payer: Self-pay

## 2017-09-01 ENCOUNTER — Telehealth: Payer: Self-pay | Admitting: Cardiovascular Disease

## 2017-09-01 MED ORDER — ATORVASTATIN CALCIUM 80 MG PO TABS: 80.0000 mg | ORAL_TABLET | Freq: Every day | ORAL | 1 refills | Status: DC

## 2017-09-01 NOTE — Telephone Encounter (Signed)
Nurse case manager Ivin Booty w/ [facility]  [phone] ext [phone] called stating she's been trying to get in touch w/ Ms. Novack with no success. Wanted to let her provider know.

## 2017-09-01 NOTE — Telephone Encounter (Signed)
Noted  

## 2017-09-01 NOTE — Telephone Encounter (Signed)
lipitor 80 mg refilled to [facility]

## 2017-09-25 ENCOUNTER — Other Ambulatory Visit (HOSPITAL_COMMUNITY): Payer: Self-pay | Admitting: Family Medicine

## 2017-09-25 DIAGNOSIS — Z1231 Encounter for screening mammogram for malignant neoplasm of breast: Secondary | ICD-10-CM

## 2017-09-27 ENCOUNTER — Encounter: Payer: Self-pay | Admitting: Internal Medicine

## 2017-10-09 ENCOUNTER — Encounter (HOSPITAL_COMMUNITY): Payer: Self-pay

## 2017-10-09 ENCOUNTER — Ambulatory Visit (HOSPITAL_COMMUNITY)
Admission: RE | Admit: 2017-10-09 | Discharge: 2017-10-09 | Disposition: A | Discharge status: Home / Self Care | Payer: Managed Care, Other (non HMO) | Source: Ambulatory Visit | Attending: Family Medicine | Admitting: Family Medicine

## 2017-10-09 DIAGNOSIS — Z1231 Encounter for screening mammogram for malignant neoplasm of breast: Secondary | ICD-10-CM

## 2017-10-30 ENCOUNTER — Ambulatory Visit (INDEPENDENT_AMBULATORY_CARE_PROVIDER_SITE_OTHER): Payer: Managed Care, Other (non HMO) | Admitting: Cardiovascular Disease

## 2017-10-30 ENCOUNTER — Encounter: Payer: Self-pay | Admitting: Cardiovascular Disease

## 2017-10-30 VITALS — BP 104/68 | HR 74 | Ht 60.0 in | Wt 124.0 lb

## 2017-10-30 DIAGNOSIS — I5042 Chronic combined systolic (congestive) and diastolic (congestive) heart failure: Secondary | ICD-10-CM

## 2017-10-30 DIAGNOSIS — E785 Hyperlipidemia, unspecified: Secondary | ICD-10-CM

## 2017-10-30 DIAGNOSIS — I10 Essential (primary) hypertension: Secondary | ICD-10-CM

## 2017-10-30 DIAGNOSIS — I25118 Atherosclerotic heart disease of native coronary artery with other forms of angina pectoris: Secondary | ICD-10-CM

## 2017-10-30 MED ORDER — METOPROLOL SUCCINATE ER 25 MG PO TB24: 12.5000 mg | ORAL_TABLET | Freq: Two times a day (BID) | ORAL | 3 refills | Status: DC

## 2017-10-30 NOTE — Progress Notes (Signed)
SUBJECTIVE: The patient presents for follow-up of coronary disease and chronic combined systolic and diastolic heart failure. She underwent drug-eluting stent placement to the RCA and PDA x 2 in November 2017.  Echocardiogram 10/20/16 showed moderately reduced left ventricular systolic function, LVEF 35-32%, grade 1 diastolic dysfunction with elevated filling pressures. There was mild to moderate mitral regurgitation.  She underwent a nuclear stress test on 03/19/17 which demonstrated a large lateral/inferolateral/inferior/inferoapical infarction with minimal peri-infarct ischemia. It was deemed a high risk study based on low LVEF and large scar. LVEF was 30%.  30-day event monitoring demonstrated sinus rhythm, sinus tachycardia, and PVCs, with symptoms correlating with all of these.  She has a lot of stress at work and at home.  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    Over the past week her blood pressure has been lower running in the 90/58 range and she has felt lightheaded.  She drinks about 4 bottles of water a day.  She has occasional chest pains, left arm pains, and left leg pains.  She denies leg swelling and syncope.      Review of Systems: As per "subjective", otherwise negative.  Allergies  Allergen Reactions  . Tape Other (See Comments)    PEELS SKIN OFF  (PAPER TAPE IS FINE)    Current Outpatient Medications  Medication Sig Dispense Refill  . ALPRAZolam (XANAX) 1 MG tablet Take 0.5-1 mg by mouth at bedtime.     [name] Kitchen aspirin EC 81 MG tablet Take 81 mg by mouth every evening.     [name] Kitchen atorvastatin (LIPITOR) 80 MG tablet Take 1 tablet (80 mg total) by mouth daily. 90 tablet 1  . calcium carbonate (TUMS - DOSED IN MG ELEMENTAL CALCIUM) 500 MG chewable tablet Chew 500 mg by mouth 2 (two) times daily as needed for indigestion or heartburn.     . ezetimibe (ZETIA) 10 MG tablet Take 1 tablet (10 mg total) by mouth daily. 30  tablet 11  . famotidine (PEPCID) 20 MG tablet Take 2 tablets (40 mg total) by mouth at bedtime.    . furosemide (LASIX) 20 MG tablet TAKE 2 TABLETS BY MOUTH ONCE DAILY 60 tablet 6  . isosorbide mononitrate (IMDUR) 30 MG 24 hr tablet Take 1 tablet (30 mg total) by mouth daily. 90 tablet 3  . KLOR-CON M20 20 MEQ tablet TAKE 1 TABLET BY MOUTH ONCE DAILY 90 tablet 3  . metoprolol succinate (TOPROL XL) 25 MG 24 hr tablet Take 1 tablet (25 mg total) by mouth daily. 90 tablet 3  . nitroGLYCERIN (NITROSTAT) 0.4 MG SL tablet DISSOLVE ONE TABLET UNDER THE TONGUE EVERY 5 MINUTES AS NEEDED FOR CHEST PAIN.  DO NOT EXCEED A TOTAL OF 3 DOSES IN 15 MINUTES 25 tablet 3  . pantoprazole (PROTONIX) 40 MG tablet Take 1 tablet (40 mg total) by mouth daily. 30 tablet 1  . sacubitril-valsartan (ENTRESTO) 24-26 MG Take 1 tablet by mouth 2 (two) times daily. 180 tablet 3   No current facility-administered medications for this visit.     Past Medical History:  Diagnosis Date  . Anxiety state 03/25/2016  . Basal cell carcinoma of forehead   . Coronary artery disease    a. 03/11/16 PCI with DES-->Prox/Mid Cx;  b. 03/14/16 PCI with DES x2-->RCA, EF 30-35%.  . Essential hypertension   . GERD (gastroesophageal reflux disease)   . HFrEF (heart failure with reduced ejection fraction) (  [place])    a. 10/2016 Echo: EF 35-40%, Gr1 DD, mild focal basal septal hypertrophy, basal inflat, mid inflat, basal antlat AK. Mid infept/inf/antlat, apical lateral sev HK. Mod MR. mildly reduced RV fxn. Mild TR.  [name] Kitchen History of pneumonia   . Hyperlipidemia   . Ischemic cardiomyopathy    a. 10/2016 Echo: EF 35-40%, Gr1 DD.  [name] Kitchen Mitral regurgitation   . NSTEMI (non-ST elevated myocardial infarction) ([place]) 03/10/2016  . Squamous cell cancer of skin of nose   . Thrombocytosis ([place]) 03/26/2016  . Tobacco abuse   . Wears dentures   . Wears glasses     Past Surgical History:  Procedure Laterality Date  . APPENDECTOMY    . CARDIAC CATHETERIZATION  N/A 03/11/2016   Procedure: Left Heart Cath and Coronary Angiography;  Surgeon: Leonie Man, MD;  Location: [facility];  Service: Cardiovascular;  Laterality: N/A;  . CARDIAC CATHETERIZATION N/A 03/11/2016   Procedure: Coronary Stent Intervention;  Surgeon: Leonie Man, MD;  Location: [facility];  Service: Cardiovascular;  Laterality: N/A;  . CARDIAC CATHETERIZATION N/A 03/14/2016   Procedure: Coronary Stent Intervention;  Surgeon: Peter M Martinique, MD;  Location: [facility];  Service: Cardiovascular;  Laterality: N/A;  . CHOLECYSTECTOMY OPEN  1984  . CORONARY ANGIOPLASTY WITH STENT PLACEMENT  03/14/2016  . FINGER ARTHROPLASTY Left 05/14/2013   Procedure: LEFT THUMB CARPAL METACARPAL ARTHROPLASTY;  Surgeon: Tennis Must, MD;  Location: [facility];  Service: Orthopedics;  Laterality: Left;  . IR ANGIO INTRA EXTRACRAN SEL COM CAROTID INNOMINATE BILAT MOD SED  01/05/2017  . IR ANGIO VERTEBRAL SEL VERTEBRAL BILAT MOD SED  01/05/2017  . IR RADIOLOGIST EVAL & MGMT  12/30/2016  . TUBAL LIGATION  1987  . VAGINAL HYSTERECTOMY  2009    Social History   Socioeconomic History  . Marital status: Married    Spouse name: Not on file  . Number of children: Not on file  . Years of education: Not on file  . Highest education level: Not on file  Occupational History  . Not on file  Social Needs  . Financial resource strain: Not on file  . Food insecurity:    Worry: Not on file    Inability: Not on file  . Transportation needs:    Medical: Not on file    Non-medical: Not on file  Tobacco Use  . Smoking status: Current Every Day Smoker    Years: 15.00    Types: Cigarettes  . Smokeless tobacco: Never Used  . Tobacco comment: 3 cigarettes daily   Substance and Sexual Activity  . Alcohol use: Yes    Alcohol/week: 3.6 oz    Types: 6 Cans of beer per week    Comment: occ  . Drug use: Not Currently    Types: Marijuana, MDMA (Ecstacy)    Comment: former  .  Sexual activity: Yes    Birth control/protection: Surgical  Lifestyle  . Physical activity:    Days per week: Not on file    Minutes per session: Not on file  . Stress: Not on file  Relationships  . Social connections:    Talks on phone: Not on file    Gets together: Not on file    Attends religious service: Not on file    Active member of club or organization: Not on file    Attends meetings of clubs or organizations: Not on file    Relationship status: Not on file  .  Intimate partner violence:    Fear of current or ex partner: Not on file    Emotionally abused: Not on file    Physically abused: Not on file    Forced sexual activity: Not on file  Other Topics Concern  . Not on file  Social History Narrative   Lives with husband in [place] in a one story home with a basement.  Has 4 children.  Works as a Quarry manager.  Education: CNA school.      Vitals:   10/30/17 0858  BP: 104/68  Pulse: 74  SpO2: 99%  Weight: 124 lb (56.2 kg)  Height: 5' (1.524 m)    Wt Readings from Last 3 Encounters:  10/30/17 124 lb (56.2 kg)  08/18/17 121 lb (54.9 kg)  08/01/17 123 lb 0.3 oz (55.8 kg)     PHYSICAL EXAM General: NAD HEENT: Normal. Neck: No JVD, no thyromegaly. Lungs: Clear to auscultation bilaterally with normal respiratory effort. CV: Regular rate and rhythm, normal S1/S2, no S3/S4, no murmur. No pretibial or periankle edema.  No carotid bruit.   Abdomen: Soft, nontender, no distention.  Neurologic: Alert and oriented.  Psych: Normal affect. Skin: Normal. Musculoskeletal: No gross deformities.    ECG: Most recent ECG reviewed.   Labs: Lab Results  Component Value Date/Time   K 3.6 07/31/2017 09:14 PM   BUN 20 07/31/2017 09:14 PM   BUN 12 07/20/2016 01:27 PM   CREATININE 0.99 07/31/2017 09:14 PM   CREATININE 0.73 05/13/2016 10:20 AM   ALT 15 07/31/2017 09:14 PM   TSH 0.60 06/26/2017 11:28 AM   HGB 11.0 (L) 07/31/2017 09:14 PM     Lipids: Lab Results  Component  Value Date/Time   LDLCALC 81 03/19/2017 06:43 AM   CHOL 179 03/19/2017 06:43 AM   TRIG 245 (H) 03/19/2017 06:43 AM   HDL 49 03/19/2017 06:43 AM       ASSESSMENT AND PLAN: 1.  Coronary artery disease: Symptomatically stable overall.  Continue aspirin, statin, Toprol-XL (I will split dose and have her take 12.5 mg twice daily), and Imdur 30 mg.  2.  Hypertension: Blood pressure is normal.  No changes to therapy.  3.  Hyperlipidemia: LDL 81 in November 2018.  Continue Lipitor 80 mg and Zetia.  4.  Cardiomyopathy/chronic combined systolic and diastolic heart failure: Euvolemic on Toprol-XL and Entresto.  Stable NYHA class II symptoms.  I will split dose of Toprol-XL and have her take 12.5 mg twice daily.    Disposition: Follow up 3 months   Kate Sable, M.D., F.A.C.C.

## 2017-10-30 NOTE — Patient Instructions (Signed)
Your physician recommends that you schedule a follow-up appointment in: 3 months     Split Toprol to 12.5 mg twice a day   All other medications stay the same   No lab work or tests ordered today.      Thank you for choosing [facility] !

## 2017-11-17 ENCOUNTER — Emergency Department (HOSPITAL_COMMUNITY): Payer: Managed Care, Other (non HMO)

## 2017-11-17 ENCOUNTER — Encounter (HOSPITAL_COMMUNITY): Payer: Self-pay | Admitting: Emergency Medicine

## 2017-11-17 ENCOUNTER — Emergency Department (HOSPITAL_COMMUNITY)
Admission: EM | Admit: 2017-11-17 | Discharge: 2017-11-17 | Disposition: A | Discharge status: Home / Self Care | Payer: Managed Care, Other (non HMO) | Attending: Emergency Medicine | Admitting: Emergency Medicine

## 2017-11-17 ENCOUNTER — Other Ambulatory Visit: Payer: Self-pay

## 2017-11-17 DIAGNOSIS — Z79899 Other long term (current) drug therapy: Secondary | ICD-10-CM | POA: Insufficient documentation

## 2017-11-17 DIAGNOSIS — I5042 Chronic combined systolic (congestive) and diastolic (congestive) heart failure: Secondary | ICD-10-CM | POA: Insufficient documentation

## 2017-11-17 DIAGNOSIS — F17210 Nicotine dependence, cigarettes, uncomplicated: Secondary | ICD-10-CM | POA: Insufficient documentation

## 2017-11-17 DIAGNOSIS — I252 Old myocardial infarction: Secondary | ICD-10-CM | POA: Insufficient documentation

## 2017-11-17 DIAGNOSIS — R0789 Other chest pain: Secondary | ICD-10-CM | POA: Insufficient documentation

## 2017-11-17 DIAGNOSIS — Z7982 Long term (current) use of aspirin: Secondary | ICD-10-CM | POA: Diagnosis not present

## 2017-11-17 DIAGNOSIS — R079 Chest pain, unspecified: Secondary | ICD-10-CM

## 2017-11-17 DIAGNOSIS — Z85828 Personal history of other malignant neoplasm of skin: Secondary | ICD-10-CM | POA: Diagnosis not present

## 2017-11-17 DIAGNOSIS — I110 Hypertensive heart disease with heart failure: Secondary | ICD-10-CM | POA: Diagnosis not present

## 2017-11-17 DIAGNOSIS — I2510 Atherosclerotic heart disease of native coronary artery without angina pectoris: Secondary | ICD-10-CM | POA: Insufficient documentation

## 2017-11-17 LAB — CBC
HCT: 33.8 % — ABNORMAL LOW (ref 36.0–46.0)
Hemoglobin: 11 g/dL — ABNORMAL LOW (ref 12.0–15.0)
MCH: 28.6 pg (ref 26.0–34.0)
MCHC: 32.5 g/dL (ref 30.0–36.0)
MCV: 87.8 fL (ref 78.0–100.0)
Platelets: 401 10*3/uL — ABNORMAL HIGH (ref 150–400)
RBC: 3.85 MIL/uL — ABNORMAL LOW (ref 3.87–5.11)
RDW: 14.6 % (ref 11.5–15.5)
WBC: 9.7 10*3/uL (ref 4.0–10.5)

## 2017-11-17 LAB — BASIC METABOLIC PANEL
Anion gap: 10 (ref 5–15)
BUN: 18 mg/dL (ref 6–20)
CO2: 25 mmol/L (ref 22–32)
Calcium: 9 mg/dL (ref 8.9–10.3)
Chloride: 106 mmol/L (ref 98–111)
Creatinine, Ser: 0.93 mg/dL (ref 0.44–1.00)
GFR calc Af Amer: >60 mL/min (ref >60)
GFR calc non Af Amer: >60 mL/min (ref >60)
Glucose, Bld: 104 mg/dL — ABNORMAL HIGH (ref 70–99)
Potassium: 3.9 mmol/L (ref 3.5–5.1)
Sodium: 141 mmol/L (ref 135–145)

## 2017-11-17 LAB — D-DIMER, QUANTITATIVE: D-Dimer, Quant: <0.27 ug/mL-FEU (ref 0.00–0.50)

## 2017-11-17 LAB — TROPONIN I
Troponin I: <0.03 ng/mL (ref <0.03)
Troponin I: <0.03 ng/mL (ref <0.03)

## 2017-11-17 MED ORDER — METOPROLOL SUCCINATE ER 25 MG PO TB24: 12.5000 mg | ORAL_TABLET | Freq: Every day | ORAL | 0 refills | Status: DC

## 2017-11-17 MED ORDER — SODIUM CHLORIDE 0.9 % IV BOLUS
1000.0000 mL | Freq: Once | INTRAVENOUS | Status: AC
Start: 2017-11-17 — End: 2017-11-17
  Administered 2017-11-17: 1000 mL via INTRAVENOUS

## 2017-11-17 MED ORDER — FUROSEMIDE 20 MG PO TABS: 20.0000 mg | ORAL_TABLET | Freq: Every day | ORAL | 0 refills | Status: DC

## 2017-11-17 MED ORDER — ASPIRIN 81 MG PO CHEW
324.0000 mg | CHEWABLE_TABLET | Freq: Once | ORAL | Status: AC
  Administered 2017-11-17: 324 mg via ORAL
  Filled 2017-11-17: qty 4

## 2017-11-17 NOTE — Consult Note (Signed)
Cardiology Consultation:   Patient ID: CHRIS NARASIMHAN; [id number]; [birth year]   Admit date: 11/17/2017 Date of Consult: 11/17/2017  Primary Care Provider: [name], MD Primary Cardiologist: Kate Sable, MD     Patient Profile:   Erin [name] is a 57 y.o. female with a hx of CAD and chronic combined systolic/diastolic  who is being seen today for the evaluation of chest pain at the request of Dr Regenia Skeeter.  History of Present Illness:   Erin [name] 57 yo female history of CAd, chronic combined systolic/diastolic HF, mild to mod MR, presents with dizziness and chest pain  Chest pain started around 530AM while in bed. Sharp pain left chest into back, 10/10. Worst with coughing. Nonproductive cough started yesterday. Can feel hot and sweaty, no other symptoms. Constant pain from 530AM to 230pm though varying in severity. Episode of dizziness and tunnel vision while standing at work today, better with sitting down. Reports normal oral hydration. Denies any N/V/D. From cardiology note 10/30/17 has noted some low bp's. 90s/high 50s. Her toprol was changed to 12.5mg  bid  03/2017 admission with chest pain,negative workup including nuclear stress Admitted 07/2017 with atypical chest pain, negative workup for ACS.   K 3.9 Cr 0.93 WBC 9.7 Hgb 11 Plt 401 D-dimer neg Trop neg x 1 EKG SR, no ischemic changes 10/2016 echo LVEF 15-40%< grade I diastolic dysfunction, mild to mod MR, mild RV dysfunction 03/2017 nuclear stress large scar with minimal peri-infarct ischemia.   Past Medical History:  Diagnosis Date  . Anxiety state 03/25/2016  . Basal cell carcinoma of forehead   . Coronary artery disease    a. 03/11/16 PCI with DES-->Prox/Mid Cx;  b. 03/14/16 PCI with DES x2-->RCA, EF 30-35%.  . Essential hypertension   . GERD (gastroesophageal reflux disease)   . HFrEF (heart failure with reduced ejection fraction) ([place])    a. 10/2016 Echo: EF 35-40%, Gr1 DD, mild focal basal septal hypertrophy,  basal inflat, mid inflat, basal antlat AK. Mid infept/inf/antlat, apical lateral sev HK. Mod MR. mildly reduced RV fxn. Mild TR.  [name] Kitchen History of pneumonia   . Hyperlipidemia   . Ischemic cardiomyopathy    a. 10/2016 Echo: EF 35-40%, Gr1 DD.  [name] Kitchen Mitral regurgitation   . NSTEMI (non-ST elevated myocardial infarction) ([place]) 03/10/2016  . Squamous cell cancer of skin of nose   . Thrombocytosis ([place]) 03/26/2016  . Tobacco abuse   . Wears dentures   . Wears glasses     Past Surgical History:  Procedure Laterality Date  . APPENDECTOMY    . CARDIAC CATHETERIZATION N/A 03/11/2016   Procedure: Left Heart Cath and Coronary Angiography;  Surgeon: Leonie Man, MD;  Location: [place];  Service: Cardiovascular;  Laterality: N/A;  . CARDIAC CATHETERIZATION N/A 03/11/2016   Procedure: Coronary Stent Intervention;  Surgeon: Leonie Man, MD;  Location: [facility];  Service: Cardiovascular;  Laterality: N/A;  . CARDIAC CATHETERIZATION N/A 03/14/2016   Procedure: Coronary Stent Intervention;  Surgeon: Peter M Martinique, MD;  Location: [place];  Service: Cardiovascular;  Laterality: N/A;  . CHOLECYSTECTOMY OPEN  1984  . CORONARY ANGIOPLASTY WITH STENT PLACEMENT  03/14/2016  . FINGER ARTHROPLASTY Left 05/14/2013   Procedure: LEFT THUMB CARPAL METACARPAL ARTHROPLASTY;  Surgeon: Tennis Must, MD;  Location: [facility];  Service: Orthopedics;  Laterality: Left;  . IR ANGIO INTRA EXTRACRAN SEL COM CAROTID INNOMINATE BILAT MOD SED  01/05/2017  . IR ANGIO VERTEBRAL SEL VERTEBRAL  BILAT MOD SED  01/05/2017  . IR RADIOLOGIST EVAL & MGMT  12/30/2016  . TUBAL LIGATION  1987  . VAGINAL HYSTERECTOMY  2009     Inpatient Medications: Scheduled Meds:  Continuous Infusions:  PRN Meds:   Allergies:    Allergies  Allergen Reactions  . Tape Other (See Comments)    PEELS SKIN OFF  (PAPER TAPE IS FINE)    Social History:   Social History   Socioeconomic History  .  Marital status: Married    Spouse name: Not on file  . Number of children: Not on file  . Years of education: Not on file  . Highest education level: Not on file  Occupational History  . Not on file  Social Needs  . Financial resource strain: Not on file  . Food insecurity:    Worry: Not on file    Inability: Not on file  . Transportation needs:    Medical: Not on file    Non-medical: Not on file  Tobacco Use  . Smoking status: Current Some Day Smoker    Years: 15.00    Types: Cigarettes  . Smokeless tobacco: Never Used  . Tobacco comment: 3 cigarettes daily   Substance and Sexual Activity  . Alcohol use: Yes    Alcohol/week: 3.6 oz    Types: 6 Cans of beer per week    Comment: occ  . Drug use: Not Currently    Types: Marijuana, MDMA (Ecstacy)    Comment: former  . Sexual activity: Yes    Birth control/protection: Surgical  Lifestyle  . Physical activity:    Days per week: Not on file    Minutes per session: Not on file  . Stress: Not on file  Relationships  . Social connections:    Talks on phone: Not on file    Gets together: Not on file    Attends religious service: Not on file    Active member of club or organization: Not on file    Attends meetings of clubs or organizations: Not on file    Relationship status: Not on file  . Intimate partner violence:    Fear of current or ex partner: Not on file    Emotionally abused: Not on file    Physically abused: Not on file    Forced sexual activity: Not on file  Other Topics Concern  . Not on file  Social History Narrative   Lives with husband in [place] in a one story home with a basement.  Has 4 children.  Works as a Quarry manager.  Education: [employer] school.     Family History:    Family History  Problem Relation Age of Onset  . Diabetes Father   . Hypertension Father   . CAD Father   . Stroke Mother   . Hypertension Mother   . Diabetes Mother   . Heart failure Other   . Breast cancer Maternal Grandmother      ROS:    Please see the history of present illness.  All other ROS reviewed and negative.     Physical Exam/Data:   Vitals:   11/17/17 1200 11/17/17 1230 11/17/17 1300 11/17/17 1330  BP: 100/68 93/70 109/70 103/67  Pulse: 75 69 72 75  Resp: 20 (!) 21 (!) 23 (!) 21  Temp:      TempSrc:      SpO2: 100% 97% 100% 98%  Weight:      Height:       No  intake or output data in the 24 hours ending 11/17/17 1351 Filed Weights   11/17/17 1111  Weight: 127 lb (57.6 kg)   Body mass index is 24.8 kg/m.  General:  Well nourished, well developed, in no acute distress HEENT: normal Lymph: no adenopathy Neck: no JVD Cardiac:  normal S1, S2; RRR; no murmur  Lungs:  clear to auscultation bilaterally, no wheezing, rhonchi or rales  Abd: soft, nontender, no hepatomegaly  Ext: no edema Musculoskeletal:  No deformities, BUE and BLE strength normal and equal Skin: warm and dry  Neuro:  CNs 2-12 intact, no focal abnormalities noted Psych:  Normal affect    Laboratory Data:  Chemistry Recent Labs  Lab 11/17/17 1145  NA 141  K 3.9  CL 106  CO2 25  GLUCOSE 104*  BUN 18  CREATININE 0.93  CALCIUM 9.0  GFRNONAA >60  GFRAA >60  ANIONGAP 10    No results for input(s): PROT, ALBUMIN, AST, ALT, ALKPHOS, BILITOT in the last 168 hours. Hematology Recent Labs  Lab 11/17/17 1145  WBC 9.7  RBC 3.85*  HGB 11.0*  HCT 33.8*  MCV 87.8  MCH 28.6  MCHC 32.5  RDW 14.6  PLT 401*   Cardiac Enzymes Recent Labs  Lab 11/17/17 1145  TROPONINI <0.03   No results for input(s): TROPIPOC in the last 168 hours.  BNPNo results for input(s): BNP, PROBNP in the last 168 hours.  DDimer  Recent Labs  Lab 11/17/17 1228  DDIMER <0.27    Radiology/Studies:  Dg Chest 2 View  Result Date: 11/17/2017 CLINICAL DATA:  Chest pain and shortness of breath EXAM: CHEST - 2 VIEW COMPARISON:  July 31, 2017 FINDINGS: The heart, hila, and mediastinum are normal. No nodules or masses. No focal infiltrates. Mildly  coarsened interstitium suggesting bronchitic change. No acute abnormality otherwise seen. IMPRESSION: Bronchitic changes.  No other acute abnormalities. Electronically Signed   By: Dorise Bullion III M.D   On: 11/17/2017 11:36    Assessment and Plan:   1. Chest pain - history of prior CAD. Recent prior admissions with atypical chest pain, negative workups for ACS includnig nuclear stress 03/2017 with large scar and minimal peri-infarct ischemia. - EKG without acute ischemic changes. Initial trop negative.  - current symptoms not cardiac in description. Constant x 9 hours, worst with coughing. Timing related to recent onset of cough, perhaps early viral URI with some related pleuritic pain - would obtain second troponin, if negative ok for discharge from ER.     2. Hypotension - noted at last outpatient cardiology visit. This is not acute.  Toprol was changed to 12.5mg  bid at that time - she is also on imdur 30, entresto 24/26, lasix 40 mg daily - would lower Toprol to 12.5mg  daily, change lasix to 20mg  daily. Would d/c IVFs now that bp has improved given her low EF   We will arrange outpatient f/u. [place] for discharge if second trop negative. Med changes as recommended above.    For questions or updates, please contact [place] Please consult [email or web address] for contact info under Cardiology/STEMI.   Merrily Pew, MD  11/17/2017 1:51 PM

## 2017-11-17 NOTE — Discharge Instructions (Addendum)
Decrease your metoprolol to 12.5 mg or 1/2 tablet once per day instead of twice per day.  Take one Lasix tablet of 20 mg once daily instead of 40 mg.  Follow-up with your cardiologist as scheduled.  If you develop worsening chest pain, shortness of breath, dizziness or other new/concerning symptoms and return to the ER for evaluation.

## 2017-11-17 NOTE — ED Triage Notes (Signed)
Pt c/o central chest discomfort with radiation to back that began this morning when she woke up. Endorses lightheadedness and SOB. Pt states they took her BP at work and it was 88/48 and sent her here.

## 2017-11-17 NOTE — ED Provider Notes (Signed)
[facility] EMERGENCY DEPARTMENT Provider Note   CSN: [id number] Arrival date & time: 11/17/17  1101     History   Chief Complaint Chief Complaint  Patient presents with  . Chest Pain    HPI Erin [name] is a 57 y.o. female.  HPI  57 year old female with a history of coronary disease with a stent most recently placed in 2017 presents with chest pain and dizziness.  Patient states when she woke up at around 530 she had chest pain.  She describes as a sharp pain at the inferior aspect of her sternum.  This is similar to prior angina she has had but not nearly as severe.  She has been having nausea without vomiting as well as some shortness of breath.  The pain seems to go to her back.  She is also been feeling dizzy since about 9:30 AM.  She states that it is a lightheaded feeling like she is going to pass out.  She also feels like her vision has been narrowing. BP checked at work, was 80 systolic.  No weakness.  No headaches.  She has not noticed any recent unilateral leg swelling or history of DVT.  She took 2 baby aspirin this morning but has not tried any nitroglycerin or other medicines for chest discomfort.  Chest pain is about 8/10.  Past Medical History:  Diagnosis Date  . Anxiety state 03/25/2016  . Basal cell carcinoma of forehead   . CHF (congestive heart failure) ([place])   . Coronary artery disease    a. 03/11/16 PCI with DES-->Prox/Mid Cx;  b. 03/14/16 PCI with DES x2-->RCA, EF 30-35%.  . Essential hypertension   . GERD (gastroesophageal reflux disease)   . HFrEF (heart failure with reduced ejection fraction) ([place])    a. 10/2016 Echo: EF 35-40%, Gr1 DD, mild focal basal septal hypertrophy, basal inflat, mid inflat, basal antlat AK. Mid infept/inf/antlat, apical lateral sev HK. Mod MR. mildly reduced RV fxn. Mild TR.  [name] Kitchen History of pneumonia   . Hyperlipidemia   . Ischemic cardiomyopathy    a. 10/2016 Echo: EF 35-40%, Gr1 DD.  [name] Kitchen Mitral regurgitation   . NSTEMI (non-ST  elevated myocardial infarction) ([place]) 03/10/2016  . Squamous cell cancer of skin of nose   . Thrombocytosis ([place]) 03/26/2016  . Tobacco abuse   . Wears dentures   . Wears glasses     Patient Active Problem List   Diagnosis Date Noted  . Chronic combined systolic and diastolic CHF (congestive heart failure) ([place]) 06/20/2016  . Hypokalemia   . Acute CHF (congestive heart failure) ([place]) 05/16/2016  . Acute on chronic systolic CHF (congestive heart failure) ([place]) 05/16/2016  . Acute respiratory failure with hypoxia ([place])   . Thrombocytosis ([place]) 03/26/2016  . Cardiomyopathy, ischemic 03/25/2016  . Acute on chronic combined systolic and diastolic congestive heart failure ([place]) 03/25/2016  . Anxiety state 03/25/2016  . Troponin level elevated 03/25/2016  . CAD (coronary artery disease), native coronary artery 03/25/2016  . Normocytic anemia 03/25/2016  . SOB (shortness of breath) 03/24/2016  . Lightheadedness 03/17/2016  . Hypotension 03/17/2016  . Tobacco abuse 03/12/2016  . NSTEMI (non-ST elevated myocardial infarction) ([place]) 03/11/2016  . Pain in the chest   . Essential hypertension 09/06/2015  . Mixed hyperlipidemia 09/06/2015  . GERD (gastroesophageal reflux disease) 09/06/2015  . Chest pain 09/06/2015    Past Surgical History:  Procedure Laterality Date  . APPENDECTOMY    . CARDIAC CATHETERIZATION N/A 03/11/2016   Procedure: Left  Heart Cath and Coronary Angiography;  Surgeon: Leonie Man, MD;  Location: [place] [facility];  Service: Cardiovascular;  Laterality: N/A;  . CARDIAC CATHETERIZATION N/A 03/11/2016   Procedure: Coronary Stent Intervention;  Surgeon: Leonie Man, MD;  Location: [place] [facility];  Service: Cardiovascular;  Laterality: N/A;  . CARDIAC CATHETERIZATION N/A 03/14/2016   Procedure: Coronary Stent Intervention;  Surgeon: Peter M Martinique, MD;  Location: [place] [place] [facility];  Service: Cardiovascular;  Laterality: N/A;  . CHOLECYSTECTOMY OPEN  1984  .  CORONARY ANGIOPLASTY WITH STENT PLACEMENT  03/14/2016  . FINGER ARTHROPLASTY Left 05/14/2013   Procedure: LEFT THUMB CARPAL METACARPAL ARTHROPLASTY;  Surgeon: Tennis Must, MD;  Location: [place] [facility];  Service: Orthopedics;  Laterality: Left;  . IR ANGIO INTRA EXTRACRAN SEL COM CAROTID INNOMINATE BILAT MOD SED  01/05/2017  . IR ANGIO VERTEBRAL SEL VERTEBRAL BILAT MOD SED  01/05/2017  . IR RADIOLOGIST EVAL & MGMT  12/30/2016  . TUBAL LIGATION  1987  . VAGINAL HYSTERECTOMY  2009     OB History    Gravida  4   Para  4   Term  4   Preterm      AB      Living  4     SAB      TAB      Ectopic      Multiple      Live Births               Home Medications    Prior to Admission medications   Medication Sig Start Date End Date Taking? Authorizing Provider  ALPRAZolam Duanne Moron) 1 MG tablet Take 0.5-1 mg by mouth at bedtime.    Yes [provider]  aspirin EC 81 MG tablet Take 81 mg by mouth every evening.    Yes [provider]  atorvastatin (LIPITOR) 80 MG tablet Take 1 tablet (80 mg total) by mouth daily. 09/01/17  Yes Herminio Commons, MD  calcium carbonate (TUMS - DOSED IN MG ELEMENTAL CALCIUM) 500 MG chewable tablet Chew 500 mg by mouth 2 (two) times daily as needed for indigestion or heartburn.    Yes [provider]  ezetimibe (ZETIA) 10 MG tablet Take 1 tablet (10 mg total) by mouth daily. 07/14/17  Yes Herminio Commons, MD  famotidine (PEPCID) 20 MG tablet Take 2 tablets (40 mg total) by mouth at bedtime. Patient taking differently: Take 20 mg by mouth 2 (two) times daily.  08/01/17  Yes Barton Dubois, MD  isosorbide mononitrate (IMDUR) 30 MG 24 hr tablet Take 1 tablet (30 mg total) by mouth daily. 08/18/17 11/17/17 Yes Strader, Fransisco Hertz, PA-C  KLOR-CON M20 20 MEQ tablet TAKE 1 TABLET BY MOUTH ONCE DAILY 04/11/17  Yes Herminio Commons, MD  LORazepam (ATIVAN) 1 MG tablet Take 1.5 mg by mouth daily. 11/09/17  Yes [provider]  nitroGLYCERIN (NITROSTAT) 0.4 MG SL tablet DISSOLVE ONE TABLET UNDER THE TONGUE EVERY 5 MINUTES AS NEEDED FOR CHEST PAIN.  DO NOT EXCEED A TOTAL OF 3 DOSES IN 15 MINUTES 09/07/16  Yes Josue Hector, MD  omeprazole (PRILOSEC) 20 MG capsule Take 1 capsule by mouth daily. 11/13/17  Yes [provider]  pantoprazole (PROTONIX) 40 MG tablet Take 1 tablet (40 mg total) by mouth daily. 08/02/17  Yes Barton Dubois, MD  rOPINIRole (REQUIP) 0.5 MG tablet Take 1 tablet by mouth at bedtime. 11/13/17  Yes [provider]  sacubitril-valsartan (ENTRESTO) 24-26 MG Take 1 tablet by mouth 2 (two) times daily. 12/16/16  Yes Herminio Commons, MD  furosemide (LASIX) 20 MG tablet Take 1 tablet (20 mg total) by mouth daily. 11/17/17   Sherwood Gambler, MD  metoprolol succinate (TOPROL XL) 25 MG 24 hr tablet Take 0.5 tablets (12.5 mg total) by mouth daily. 11/17/17   Sherwood Gambler, MD    Family History Family History  Problem Relation Age of Onset  . Diabetes Father   . Hypertension Father   . CAD Father   . Stroke Mother   . Hypertension Mother   . Diabetes Mother   . Heart failure Other   . Breast cancer Maternal Grandmother     Social History Social History   Tobacco Use  . Smoking status: Current Some Day Smoker    Years: 15.00    Types: Cigarettes  . Smokeless tobacco: Never Used  . Tobacco comment: 3 cigarettes daily   Substance Use Topics  . Alcohol use: Yes    Alcohol/week: 3.6 oz    Types: 6 Cans of beer per week    Comment: occ  . Drug use: Not Currently    Types: Marijuana, MDMA (Ecstacy)    Comment: former     Allergies   Tape   Review of Systems Review of Systems  Respiratory: Positive for cough (dry cough x 1 day) and shortness of breath.   Cardiovascular: Positive for chest pain.  Gastrointestinal: Positive for nausea. Negative for abdominal pain and vomiting.  Neurological: Positive for light-headedness.  All other systems reviewed and are  negative.    Physical Exam Updated Vital Signs BP 109/75   Pulse 75   Temp 97.7 F (36.5 C) (Oral)   Resp 20   Ht 5' (1.524 m)   Wt 57.6 kg (127 lb)   SpO2 100%   BMI 24.80 kg/m   Physical Exam  Constitutional: She is oriented to person, place, and time. She appears well-developed and well-nourished.  Non-toxic appearance. She does not appear ill. No distress.  HENT:  Head: Normocephalic and atraumatic.  Right Ear: External ear normal.  Left Ear: External ear normal.  Nose: Nose normal.  Eyes: EOM are normal. Right eye exhibits no discharge. Left eye exhibits no discharge.  Cardiovascular: Normal rate, regular rhythm and normal heart sounds.  Pulmonary/Chest: Effort normal and breath sounds normal. She exhibits tenderness (mild).    Abdominal: Soft. There is no tenderness.  Neurological: She is alert and oriented to person, place, and time.  CN 3-12 grossly intact. 5/5 strength in all 4 extremities. Grossly normal sensation. Normal finger to nose.   Skin: Skin is warm and dry.  Nursing note and vitals reviewed.    ED Treatments / Results  Labs (all labs ordered are listed, but only abnormal results are displayed) Labs Reviewed  BASIC METABOLIC PANEL - Abnormal; Notable for the following components:      Result Value   Glucose, Bld 104 (*)    All other components within normal limits  CBC - Abnormal; Notable for the following components:   RBC 3.85 (*)    Hemoglobin 11.0 (*)    HCT 33.8 (*)    Platelets 401 (*)    All other components within normal limits  TROPONIN I  D-DIMER, QUANTITATIVE (NOT AT [facility])  TROPONIN I    EKG EKG Interpretation  Date/Time:  Friday November 17 2017 11:14:30 EDT Ventricular Rate:  85 PR Interval:  130 QRS Duration: 66 QT  Interval:  386 QTC Calculation: 185 R Axis:   16 Text Interpretation:  Normal sinus rhythm no acute ST/T changes no significant change since Mar 2019 Confirmed by Sherwood Gambler [phone]) on 11/17/2017 12:04:10  PM   EKG Interpretation  Date/Time:  Friday November 17 2017 14:05:49 EDT Ventricular Rate:  70 PR Interval:  130 QRS Duration: 86 QT Interval:  423 QTC Calculation: 457 R Axis:   2 Text Interpretation:  Sinus rhythm Borderline low voltage, extremity leads no significant change since earlier in the day Confirmed by Sherwood Gambler [phone]) on 11/17/2017 2:16:43 PM       Radiology Dg Chest 2 View  Result Date: 11/17/2017 CLINICAL DATA:  Chest pain and shortness of breath EXAM: CHEST - 2 VIEW COMPARISON:  July 31, 2017 FINDINGS: The heart, hila, and mediastinum are normal. No nodules or masses. No focal infiltrates. Mildly coarsened interstitium suggesting bronchitic change. No acute abnormality otherwise seen. IMPRESSION: Bronchitic changes.  No other acute abnormalities. Electronically Signed   By: Dorise Bullion III M.D   On: 11/17/2017 11:36    Procedures Procedures (including critical care time)  Medications Ordered in ED Medications  aspirin chewable tablet 324 mg (324 mg Oral Given 11/17/17 1234)  sodium chloride 0.9 % bolus 1,000 mL (0 mLs Intravenous Stopped 11/17/17 1449)     Initial Impression / Assessment and Plan / ED Course  I have reviewed the triage vital signs and the nursing notes.  Pertinent labs & imaging results that were available during my care of the patient were reviewed by me and considered in my medical decision making (see chart for details).     Patient's chest pain is atypical.  She states this is similar to her prior angina but when she had the MI she was having much more pressure than the sharp pain she is describing today.  I discussed with Dr. Harl Bowie of cardiology who has evaluated patient.  Feels this is also likely not ACS.  I doubt PE or dissection as well. As far as her dizziness is probably related to some low blood pressures, possibly from dehydration but also possibly medication related.  Advises cutting her Lasix back from 40 mg to 20 mg and her  metoprolol from 12.5 mg twice daily to once a day.  Second troponin is negative.  She will be discharged home with return precautions and a cardiology outpatient appointment has been made for August 1 by Dr. Harl Bowie.  Final Clinical Impressions(s) / ED Diagnoses   Final diagnoses:  Nonspecific chest pain    ED Discharge Orders        Ordered    furosemide (LASIX) 20 MG tablet  Daily    Note to Pharmacy:  Please consider 90 day supplies to promote better adherence   11/17/17 1603    metoprolol succinate (TOPROL XL) 25 MG 24 hr tablet  Daily     11/17/17 1603       Sherwood Gambler, MD 11/17/17 1615

## 2017-11-20 ENCOUNTER — Telehealth (HOSPITAL_COMMUNITY): Payer: Self-pay

## 2017-11-20 NOTE — Telephone Encounter (Signed)
Called to schedule f/u mra, no answer, left vm. AW 

## 2017-11-21 ENCOUNTER — Other Ambulatory Visit (HOSPITAL_COMMUNITY): Payer: Self-pay | Admitting: Interventional Radiology

## 2017-11-21 DIAGNOSIS — I771 Stricture of artery: Secondary | ICD-10-CM

## 2017-11-27 ENCOUNTER — Ambulatory Visit (INDEPENDENT_AMBULATORY_CARE_PROVIDER_SITE_OTHER): Payer: Managed Care, Other (non HMO) | Admitting: Obstetrics & Gynecology

## 2017-11-27 ENCOUNTER — Other Ambulatory Visit: Payer: Self-pay

## 2017-11-27 ENCOUNTER — Encounter: Payer: Self-pay | Admitting: Obstetrics & Gynecology

## 2017-11-27 VITALS — BP 107/71 | HR 96 | Ht 60.0 in | Wt 126.0 lb

## 2017-11-27 DIAGNOSIS — A5901 Trichomonal vulvovaginitis: Secondary | ICD-10-CM | POA: Diagnosis not present

## 2017-11-27 DIAGNOSIS — N952 Postmenopausal atrophic vaginitis: Secondary | ICD-10-CM | POA: Diagnosis not present

## 2017-11-27 MED ORDER — METRONIDAZOLE 500 MG PO TABS: 500.0000 mg | ORAL_TABLET | Freq: Two times a day (BID) | ORAL | 1 refills | Status: DC

## 2017-11-27 NOTE — Progress Notes (Signed)
       Chief Complaint  Patient presents with  . Vaginal Bleeding    s/p hysterectomy; heavy discharge, itching,burning, pain with sex    Blood pressure 107/71, pulse 96, height 5' (1.524 m), weight 126 lb (57.2 kg).  57 y.o. [id number] No LMP recorded. Patient has had a hysterectomy. The current method of family planning is status post hysterectomy.  Subjective Vaginal discharge for 3weeks Itching yes Irritation yes Odor no Similar to previous unsure  Previous treatment none  Objective Vulva:  No erythem or evidence of yeast atrophic Vagina:  Severe atrophy thin grey discharge Cervix:  absent Uterus:  uterus absent Adnexa: ovaries:not present,  normal adnexa in size, nontender and no masses     Pertinent ROS Recent antibiotics Not diabetic  Labs or studies Wet Prep:   A sample of vaginal discharge was obtained from the posterior fornix using a cotton swab. 2 drops of saline were placed on a slide and the cotton swab was immersed in the saline. Microscopic evaluation was performed and results were as follows:  Negative  for yeast  Negative for clue cells , consistent with Bacterial vaginosis positive for trichomonas  Normal WBC population   Whiff test: Negative     Impression Diagnoses this Encounter::   ICD-10-CM   1. Trichomonal vaginitis A59.01   2. Atrophic vaginitis N95.2     Established relevant diagnosis(es):   Plan/Recommendations: Meds ordered this encounter  Medications  . metroNIDAZOLE (FLAGYL) 500 MG tablet    Sig: Take 1 tablet (500 mg total) by mouth 2 (two) times daily.    Dispense:  14 tablet    Refill:  1    Please refill for sexual partner    Labs or Scans Ordered: No orders of the defined types were placed in this encounter.   Management:: Metronidazole for a week Re check in 3 weeks for TOC Then start vagifem tablets or similar for improving vaginal health  Follow up Return in about 3 weeks (around 12/18/2017) for  Follow up, with Dr Elonda Husky.     All questions were answered.

## 2017-11-28 ENCOUNTER — Ambulatory Visit (HOSPITAL_COMMUNITY)
Admission: RE | Admit: 2017-11-28 | Discharge: 2017-11-28 | Disposition: A | Discharge status: Home / Self Care | Payer: Managed Care, Other (non HMO) | Source: Ambulatory Visit | Attending: Interventional Radiology | Admitting: Interventional Radiology

## 2017-11-28 DIAGNOSIS — I6521 Occlusion and stenosis of right carotid artery: Secondary | ICD-10-CM | POA: Diagnosis not present

## 2017-11-28 DIAGNOSIS — I771 Stricture of artery: Secondary | ICD-10-CM

## 2017-11-30 ENCOUNTER — Telehealth (HOSPITAL_COMMUNITY): Payer: Self-pay

## 2017-11-30 NOTE — Telephone Encounter (Signed)
Pt agreed to f/u in 6 months with a us carotid. AW 

## 2017-12-04 ENCOUNTER — Other Ambulatory Visit: Payer: Self-pay | Admitting: *Deleted

## 2017-12-04 MED ORDER — SACUBITRIL-VALSARTAN 24-26 MG PO TABS: 1.0000 | ORAL_TABLET | Freq: Two times a day (BID) | ORAL | 3 refills | Status: DC

## 2017-12-06 NOTE — Progress Notes (Signed)
Cardiology Office Note    Date:  12/07/2017   ID:  Erin [name], Erin [name] 05/20/1960, MRN [id number]  PCP:  [name], MD  Cardiologist: Kate Sable, MD    Chief Complaint  Patient presents with  . Follow-up    recent Emergency Dept visit    History of Present Illness:    Erin [name] is a 57 y.o. female with past medical history of CAD (s/p DES to LCx with staged PCI/DESx2 to RCA/PDA in 03/2016, NST in 03/2017 showing large scar with minimal peri-infarct ischemia), chronic combined systolic and diastolic CHF (EF 53-97% by echo in 10/2016), HTN, HLD, and moderate MR  who presents to the office today for follow-up from a recent Emergency Department visit.   She was last examined by Dr. Bronson Ing in 10/2017 and reported episodes of dizziness in the setting of hypotension as BP had been 90/58 when checked most recently at home. She was continued on her current medication regimen with Toprol-XL being split into 12.5 mg twice Erin [name].   In the interim, she presented to [facility] ED on 11/17/2017 for evaluation of chest discomfort which radiated into her back. Also reported associated nausea and lightheadedness as BP had been 88/48 when checked at work. Labs showed WBC 9.7, Hgb 11.0, platelets 401, Na+ 141, K+ 3.9, and creatinine 0.93. Initial and delta troponin values were negative along with d-dimer being negative. EKG showed no acute ischemic changes. She was evaluated by Dr. Harl Bowie in the ED and symptoms were thought to be atypical for a cardiac etiology as her discomfort have been constant for over 9 hours and was worse with coughing. Symptoms were thought to be most consistent with a pleuritic etiology and she was discharged home with close outpatient follow-up recommended. Toprol-XL was reduced to 12.5 mg Erin [name] and Lasix was reduced to 20 mg Erin [name] given her hypotension.  She reports overall doing well from a cardiac perspective since her recent ED visit. She denies any repeat  episodes of lightheadedness, dizziness, or exertional chest discomfort. No recent dyspnea on exertion, orthopnea, PND, or lower extremity edema. 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 Does not resemble her prior anginal symptoms.  She reports good compliance with her medication regimen. She has followed blood pressure in the ambulatory setting and is says SBP has remained greater than 100 since her recent medication changes.   Past Medical History:  Diagnosis Date  . Anxiety state 03/25/2016  . Basal cell carcinoma of forehead   . CHF (congestive heart failure) ([place])   . Coronary artery disease    a. 03/11/16 PCI with DES-->Prox/Mid Cx;  b. 03/14/16 PCI with DES x2-->RCA, EF 30-35%.  . Essential hypertension   . GERD (gastroesophageal reflux disease)   . HFrEF (heart failure with reduced ejection fraction) ([place])    a. 10/2016 Echo: EF 35-40%, Gr1 DD, mild focal basal septal hypertrophy, basal inflat, mid inflat, basal antlat AK. Mid infept/inf/antlat, apical lateral sev HK. Mod MR. mildly reduced RV fxn. Mild TR.  [name] Kitchen History of pneumonia   . Hyperlipidemia   . Ischemic cardiomyopathy    a. 10/2016 Echo: EF 35-40%, Gr1 DD.  [name] Kitchen Mitral regurgitation   . NSTEMI (non-ST elevated myocardial infarction) ([place]) 03/10/2016  . Squamous cell cancer of skin of nose   . Thrombocytosis ([place]) 03/26/2016  . Tobacco abuse   . Wears dentures   . Wears glasses  Past Surgical History:  Procedure Laterality Date  . APPENDECTOMY    . CARDIAC CATHETERIZATION N/A 03/11/2016   Procedure: Left Heart Cath and Coronary Angiography;  Surgeon: Leonie Man, MD;  Location: [place] [facility];  Service: Cardiovascular;  Laterality: N/A;  . CARDIAC CATHETERIZATION N/A 03/11/2016   Procedure: Coronary Stent Intervention;  Surgeon: Leonie Man, MD;  Location: [place] [facility];  Service: Cardiovascular;  Laterality: N/A;  . CARDIAC  CATHETERIZATION N/A 03/14/2016   Procedure: Coronary Stent Intervention;  Surgeon: Peter M Martinique, MD;  Location: [place] [facility];  Service: Cardiovascular;  Laterality: N/A;  . CHOLECYSTECTOMY OPEN  1984  . CORONARY ANGIOPLASTY WITH STENT PLACEMENT  03/14/2016  . FINGER ARTHROPLASTY Left 05/14/2013   Procedure: LEFT THUMB CARPAL METACARPAL ARTHROPLASTY;  Surgeon: Tennis Must, MD;  Location: [facility];  Service: Orthopedics;  Laterality: Left;  . IR ANGIO INTRA EXTRACRAN SEL COM CAROTID INNOMINATE BILAT MOD SED  01/05/2017  . IR ANGIO VERTEBRAL SEL VERTEBRAL BILAT MOD SED  01/05/2017  . IR RADIOLOGIST EVAL & MGMT  12/30/2016  . TUBAL LIGATION  1987  . VAGINAL HYSTERECTOMY  2009    Current Medications: Outpatient Medications Prior to Visit  Medication Sig Dispense Refill  . ALPRAZolam (XANAX) 1 MG tablet Take 0.5-1 mg by mouth at bedtime.     [name] Kitchen aspirin EC 81 MG tablet Take 81 mg by mouth every evening.     [name] Kitchen atorvastatin (LIPITOR) 80 MG tablet Take 1 tablet (80 mg total) by mouth Erin [name]. 90 tablet 1  . calcium carbonate (TUMS - DOSED IN MG ELEMENTAL CALCIUM) 500 MG chewable tablet Chew 500 mg by mouth 2 (two) times Erin [name] as needed for indigestion or heartburn.     . ezetimibe (ZETIA) 10 MG tablet Take 1 tablet (10 mg total) by mouth Erin [name]. 30 tablet 11  . famotidine (PEPCID) 20 MG tablet Take 2 tablets (40 mg total) by mouth at bedtime. (Patient taking differently: Take 20 mg by mouth 2 (two) times Erin [name]. )    . furosemide (LASIX) 20 MG tablet Take 1 tablet (20 mg total) by mouth Erin [name]. 30 tablet 0  . KLOR-CON M20 20 MEQ tablet TAKE 1 TABLET BY MOUTH ONCE Erin [name] 90 tablet 3  . LORazepam (ATIVAN) 1 MG tablet Take 1.5 mg by mouth Erin [name].  1  . metoprolol succinate (TOPROL XL) 25 MG 24 hr tablet Take 0.5 tablets (12.5 mg total) by mouth Erin [name]. 30 tablet 0  . metroNIDAZOLE (FLAGYL) 500 MG tablet Take 1 tablet (500 mg total) by mouth 2 (two) times Erin [name]. 14 tablet 1  . nitroGLYCERIN  (NITROSTAT) 0.4 MG SL tablet DISSOLVE ONE TABLET UNDER THE TONGUE EVERY 5 MINUTES AS NEEDED FOR CHEST PAIN.  DO NOT EXCEED A TOTAL OF 3 DOSES IN 15 MINUTES 25 tablet 3  . omeprazole (PRILOSEC) 20 MG capsule Take 1 capsule by mouth Erin [name].  0  . rOPINIRole (REQUIP) 0.5 MG tablet Take 1 tablet by mouth at bedtime.  0  . sacubitril-valsartan (ENTRESTO) 24-26 MG Take 1 tablet by mouth 2 (two) times Erin [name]. 180 tablet 3  . isosorbide mononitrate (IMDUR) 30 MG 24 hr tablet Take 1 tablet (30 mg total) by mouth Erin [name]. 90 tablet 3  . pantoprazole (PROTONIX) 40 MG tablet Take 1 tablet (40 mg total) by mouth Erin [name]. 30 tablet 1   No facility-administered medications prior to visit.      Allergies:   Tape   Social History  Socioeconomic History  . Marital status: Married    Spouse name: Not on file  . Number of children: Not on file  . Years of education: Not on file  . Highest education level: Not on file  Occupational History  . Not on file  Social Needs  . Financial resource strain: Not on file  . Food insecurity:    Worry: Not on file    Inability: Not on file  . Transportation needs:    Medical: Not on file    Non-medical: Not on file  Tobacco Use  . Smoking status: Current Some Day Smoker    Packs/day: 0.10    Years: 15.00    Pack years: 1.50    Types: Cigarettes  . Smokeless tobacco: Never Used  . Tobacco comment: 3 cigarettes Erin [name]   Substance and Sexual Activity  . Alcohol use: Yes    Alcohol/week: 3.6 oz    Types: 6 Cans of beer per week    Comment: occ  . Drug use: Not Currently    Types: Marijuana, MDMA (Ecstacy)    Comment: former  . Sexual activity: Not Currently    Birth control/protection: Surgical  Lifestyle  . Physical activity:    Days per week: Not on file    Minutes per session: Not on file  . Stress: Not on file  Relationships  . Social connections:    Talks on phone: Not on file    Gets together: Not on file    Attends religious service: Not on file      Active member of club or organization: Not on file    Attends meetings of clubs or organizations: Not on file    Relationship status: Not on file  Other Topics Concern  . Not on file  Social History Narrative   Lives with husband in [place] in a one story home with a basement.  Has 4 children.  Works as a Quarry manager.  Education: CNA school.      Family History:  The patient's family history includes Breast cancer in her maternal grandmother; CAD in her father; Diabetes in her father and mother; Heart failure in her other; Hypertension in her father and mother; Stroke in her mother.   Review of Systems:   Please see the history of present illness.     General:  No chills, fever, night sweats or weight changes.  Cardiovascular:  No chest pain, dyspnea on exertion, edema, orthopnea, palpitations, paroxysmal nocturnal dyspnea. Dermatological: No rash, lesions/masses Respiratory: No cough, dyspnea Urologic: No hematuria, dysuria Abdominal:   No nausea, vomiting, diarrhea, bright red blood per rectum, melena, or hematemesis Neurologic:  No visual changes, wkns, changes in mental status. Positive for dizziness (improved).   All other systems reviewed and are otherwise negative except as noted above.   Physical Exam:    VS:  BP 106/70   Pulse 85   Ht 5' (1.524 m)   Wt 124 lb (56.2 kg)   SpO2 98%   BMI 24.22 kg/m    General: Well developed, well nourished Caucasian female appearing in no acute distress. Head: Normocephalic, atraumatic, sclera non-icteric, no xanthomas, nares are without discharge.  Neck: No carotid bruits. JVD not elevated.  Lungs: Respirations regular and unlabored, without wheezes or rales.  Heart: Regular rate and rhythm. No S3 or S4.  No murmur, no rubs, or gallops appreciated. Abdomen: Soft, non-tender, non-distended with normoactive bowel sounds. No hepatomegaly. No rebound/guarding. No obvious abdominal masses. Msk:  Strength and tone  appear normal for age. No joint  deformities or effusions. Extremities: No clubbing or cyanosis. No lower extremity edema.  Distal pedal pulses are 2+ bilaterally. Neuro: Alert and oriented X 3. Moves all extremities spontaneously. No focal deficits noted. Psych:  Responds to questions appropriately with a normal affect. Skin: No rashes or lesions noted  Wt Readings from Last 3 Encounters:  12/07/17 124 lb (56.2 kg)  11/27/17 126 lb (57.2 kg)  11/17/17 127 lb (57.6 kg)     Studies/Labs Reviewed:   EKG:  EKG is not ordered today.   Recent Labs: 06/26/2017: TSH 0.60 07/31/2017: ALT 15 11/17/2017: BUN 18; Creatinine, Ser 0.93; Hemoglobin 11.0; Platelets 401; Potassium 3.9; Sodium 141   Lipid Panel    Component Value Date/Time   CHOL 179 03/19/2017 0643   TRIG 245 (H) 03/19/2017 0643   HDL 49 03/19/2017 0643   CHOLHDL 3.7 03/19/2017 0643   VLDL 49 (H) 03/19/2017 0643   LDLCALC 81 03/19/2017 0643    Additional studies/ records that were reviewed today include:   Echocardiogram: 10/2016 Study Conclusions  - Left ventricle: The cavity size was normal. Systolic function was   moderately reduced. The estimated ejection fraction was in the   range of 35% to 40%. Doppler parameters are consistent with   abnormal left ventricular relaxation (grade 1 diastolic   dysfunction). Doppler parameters are consistent with high   ventricular filling pressure. Mild focal basal septal   hypertrophy. - Regional wall motion abnormality: Akinesis and scarring of the   basal inferolateral myocardium; akinesis of the mid inferolateral   and basal anterolateral myocardium; severe hypokinesis of the mid   inferoseptal, mid inferior, mid anterolateral, and apical lateral   myocardium. - Mitral valve: There was mild to moderate regurgitation. - Right ventricle: Systolic function was mildly reduced. - Tricuspid valve: There was mild regurgitation. - Pericardium, extracardiac: There was no pericardial effusion.  Event Monitor:  03/2017  Sinus rhythm, sinus tachycardia, and PVCs seen.  Symptoms correlated with all of the above.  Assessment:    1. Coronary artery disease of native artery of native heart with stable angina pectoris ([name])   2. Chronic combined systolic and diastolic CHF (congestive heart failure) ([place])   3. Essential hypertension   4. Hyperlipidemia LDL goal <70   5. Mitral valve insufficiency, unspecified etiology      Plan:   In order of problems listed above:  1. CAD - s/p DES to LCx with staged PCI/DESx2 to RCA/PDA in 03/2016. Most recent ischemic evaluation was a NST in 03/2017 which showed a large scar with minimal peri-infarct ischemia. Reports episodes of discomfort at times when lifting people at her job which can last for hours and improves with NSAIDS or a warm shower. No symptoms when walking for long distances or performing other duties. Not similar to prior anginal symptoms. Symptoms seem most consistent with a MSK etiology. Recent EKG showed no acute ischemic changes and troponin values were negative. No plans for repeat ischemic evaluation at this time given her recent NST and atypical symptoms.  - continue ASA, statin, BB therapy, and Imdur.   2. Chronic Combined Systolic and Diastolic CHF/ Ischemic Cardiomyopathy - Known reduced EF of 35 to 40% by most recent echocardiogram in 10/2016. Reports being due for a repeat echo in 02/2018 but orders are not entered for this. Will plan to obtain repeat imaging to assess LV function at that time. If EF has improved, may need to consider switching from [facility] to  Losartan if hypotension continues to be an issue. BP has been stable since recent med changes, therefore I am hopeful she can continue her current regimen. - at this time, will continue Lasix 20mg  Erin [name], Toprol-XL 12.5mg  Erin [name], and Entresto 24-26mg  BID. Can take an extra Lasix tablet for weight gain greater than 3 lbs overnight or greater than 5 lbs in one week.   3. HTN - BP is  well-controlled at 106/70 during today's visit. Was previously having hypotension as outlined above but symptoms improved with dose reduction of Toprol-XL and Lasix. Continue on current medication regimen at this time.   4. HLD - followed by PCP. Goal LDL is less than 70 in the setting of known CAD. Continue Atorvastatin 80mg  Erin [name] and Zetia 10mg  Erin [name].   5. Mitral Regurgitation - mild to moderate MR by echocardiogram in 10/2016.   Medication Adjustments/Labs and Tests Ordered: Current medicines are reviewed at length with the patient today.  Concerns regarding medicines are outlined above.  Medication changes, Labs and Tests ordered today are listed in the Patient Instructions below. Patient Instructions  Medication Instructions:  Your physician recommends that you continue on your current medications as directed. Please refer to the Current Medication list given to you today.   Labwork: NONE   Testing/Procedures: Your physician has requested that you have an echocardiogram. Echocardiography is a painless test that uses sound waves to create images of your heart. It provides your doctor with information about the size and shape of your heart and how well your heart's chambers and valves are working. This procedure takes approximately one hour. There are no restrictions for this procedure.  Follow-Up: Your physician recommends that you schedule a follow-up appointment in: October with Dr. Dwana Curd.   Any Other Special Instructions Will Be Listed Below (If Applicable).  If you need a refill on your cardiac medications before your next appointment, please call your pharmacy. Thank you for choosing [facility]! '    Signed, Erma Heritage, Hershal Coria  12/07/2017 9:04 PM    [facility]. [address] [place], [place] [address] Phone: [phone]

## 2017-12-07 ENCOUNTER — Encounter: Payer: Self-pay | Admitting: Student

## 2017-12-07 ENCOUNTER — Ambulatory Visit (INDEPENDENT_AMBULATORY_CARE_PROVIDER_SITE_OTHER): Payer: Managed Care, Other (non HMO) | Admitting: Student

## 2017-12-07 VITALS — BP 106/70 | HR 85 | Ht 60.0 in | Wt 124.0 lb

## 2017-12-07 DIAGNOSIS — I5042 Chronic combined systolic (congestive) and diastolic (congestive) heart failure: Secondary | ICD-10-CM | POA: Diagnosis not present

## 2017-12-07 DIAGNOSIS — I340 Nonrheumatic mitral (valve) insufficiency: Secondary | ICD-10-CM

## 2017-12-07 DIAGNOSIS — E785 Hyperlipidemia, unspecified: Secondary | ICD-10-CM

## 2017-12-07 DIAGNOSIS — I25118 Atherosclerotic heart disease of native coronary artery with other forms of angina pectoris: Secondary | ICD-10-CM

## 2017-12-07 DIAGNOSIS — I10 Essential (primary) hypertension: Secondary | ICD-10-CM | POA: Diagnosis not present

## 2017-12-07 NOTE — Patient Instructions (Signed)
Medication Instructions:  Your physician recommends that you continue on your current medications as directed. Please refer to the Current Medication list given to you today.   Labwork: NONE   Testing/Procedures: Your physician has requested that you have an echocardiogram. Echocardiography is a painless test that uses sound waves to create images of your heart. It provides your doctor with information about the size and shape of your heart and how well your heart's chambers and valves are working. This procedure takes approximately one hour. There are no restrictions for this procedure.    Follow-Up: Your physician recommends that you schedule a follow-up appointment in: October with Dr. Dwana Curd.    Any Other Special Instructions Will Be Listed Below (If Applicable).     If you need a refill on your cardiac medications before your next appointment, please call your pharmacy. Thank you for choosing [facility]! '

## 2017-12-13 ENCOUNTER — Ambulatory Visit (HOSPITAL_COMMUNITY): Payer: Managed Care, Other (non HMO)

## 2017-12-18 ENCOUNTER — Ambulatory Visit: Payer: Managed Care, Other (non HMO) | Admitting: Obstetrics & Gynecology

## 2017-12-20 ENCOUNTER — Encounter: Payer: Self-pay | Admitting: Gastroenterology

## 2017-12-20 ENCOUNTER — Ambulatory Visit (HOSPITAL_COMMUNITY)
Admission: RE | Admit: 2017-12-20 | Discharge: 2017-12-20 | Disposition: A | Discharge status: Home / Self Care | Payer: Managed Care, Other (non HMO) | Source: Ambulatory Visit | Attending: Student | Admitting: Student

## 2017-12-20 ENCOUNTER — Ambulatory Visit (INDEPENDENT_AMBULATORY_CARE_PROVIDER_SITE_OTHER): Payer: Managed Care, Other (non HMO) | Admitting: Gastroenterology

## 2017-12-20 VITALS — BP 109/78 | HR 82 | Temp 97.6°F | Ht 60.0 in | Wt 123.6 lb

## 2017-12-20 DIAGNOSIS — I081 Rheumatic disorders of both mitral and tricuspid valves: Secondary | ICD-10-CM | POA: Insufficient documentation

## 2017-12-20 DIAGNOSIS — E782 Mixed hyperlipidemia: Secondary | ICD-10-CM | POA: Insufficient documentation

## 2017-12-20 DIAGNOSIS — I313 Pericardial effusion (noninflammatory): Secondary | ICD-10-CM | POA: Insufficient documentation

## 2017-12-20 DIAGNOSIS — I5042 Chronic combined systolic (congestive) and diastolic (congestive) heart failure: Secondary | ICD-10-CM

## 2017-12-20 DIAGNOSIS — I2510 Atherosclerotic heart disease of native coronary artery without angina pectoris: Secondary | ICD-10-CM | POA: Insufficient documentation

## 2017-12-20 DIAGNOSIS — I110 Hypertensive heart disease with heart failure: Secondary | ICD-10-CM | POA: Insufficient documentation

## 2017-12-20 DIAGNOSIS — I252 Old myocardial infarction: Secondary | ICD-10-CM | POA: Insufficient documentation

## 2017-12-20 DIAGNOSIS — K219 Gastro-esophageal reflux disease without esophagitis: Secondary | ICD-10-CM | POA: Insufficient documentation

## 2017-12-20 DIAGNOSIS — K529 Noninfective gastroenteritis and colitis, unspecified: Secondary | ICD-10-CM

## 2017-12-20 LAB — ECHOCARDIOGRAM COMPLETE
Height: 60 in
Weight: 1977.6 oz

## 2017-12-20 NOTE — Progress Notes (Signed)
*  PRELIMINARY RESULTS* Echocardiogram 2D Echocardiogram has been performed.  Erin [name] 12/20/2017, 1:51 PM

## 2017-12-20 NOTE — Progress Notes (Signed)
Primary Care Physician:  [name], MD Primary Gastroenterologist:  Dr. Gala Romney   Chief Complaint  Patient presents with  . Colonoscopy    never had tcs  . increased stools    more frequent stools/clear mucous since completed 2 antibiotics    HPI:   Erin [name] is a 57 y.o. female presenting today at the request of Dr. Maudie Mercury for initial screening colonoscopy. She was brought into the office to evaluate need for Propofol due to polypharmacy. Outside labs dated May 2019 with Hgb 11.8, Hct 37.1.   Postprandial frequent stools for past 5-6 days. Clear gel mucus. Stool is fluffy. Not hard, not soft. Jelly-like texture. Associated abdominal cramping. One day of low-volume hematochezia. Has been on 2 different antibiotics recently. Finished the last round about 2 weeks ago. First round for bladder infection. Second yeast infection. 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   States her normal BMs are every 3 days. Has never gone every day historically since cholecystectomy.   Father with pre-cancerous polyps in his early 28s.   Past Medical History:  Diagnosis Date  . Anxiety state 03/25/2016  . Basal cell carcinoma of forehead   . CHF (congestive heart failure) ([place])   . Coronary artery disease    a. 03/11/16 PCI with DES-->Prox/Mid Cx;  b. 03/14/16 PCI with DES x2-->RCA, EF 30-35%.  . Essential hypertension   . GERD (gastroesophageal reflux disease)   . HFrEF (heart failure with reduced ejection fraction) ([place])    a. 10/2016 Echo: EF 35-40%, Gr1 DD, mild focal basal septal hypertrophy, basal inflat, mid inflat, basal antlat AK. Mid infept/inf/antlat, apical lateral sev HK. Mod MR. mildly reduced RV fxn. Mild TR.  [name] Kitchen History of pneumonia   . Hyperlipidemia   . Ischemic cardiomyopathy    a. 10/2016 Echo: EF 35-40%, Gr1 DD.  [name] Kitchen Mitral regurgitation   . NSTEMI (non-ST elevated myocardial infarction) ([place]) 03/10/2016  . Squamous cell cancer of skin of nose   .  Thrombocytosis ([place]) 03/26/2016  . Tobacco abuse   . Wears dentures   . Wears glasses     Past Surgical History:  Procedure Laterality Date  . APPENDECTOMY    . CARDIAC CATHETERIZATION N/A 03/11/2016   Procedure: Left Heart Cath and Coronary Angiography;  Surgeon: Leonie Man, MD;  Location: [place] [facility];  Service: Cardiovascular;  Laterality: N/A;  . CARDIAC CATHETERIZATION N/A 03/11/2016   Procedure: Coronary Stent Intervention;  Surgeon: Leonie Man, MD;  Location: [place] [facility];  Service: Cardiovascular;  Laterality: N/A;  . CARDIAC CATHETERIZATION N/A 03/14/2016   Procedure: Coronary Stent Intervention;  Surgeon: Peter M Martinique, MD;  Location: [place] [facility];  Service: Cardiovascular;  Laterality: N/A;  . CHOLECYSTECTOMY OPEN  1984  . CORONARY ANGIOPLASTY WITH STENT PLACEMENT  03/14/2016  . FINGER ARTHROPLASTY Left 05/14/2013   Procedure: LEFT THUMB CARPAL METACARPAL ARTHROPLASTY;  Surgeon: Tennis Must, MD;  Location: [place];  Service: Orthopedics;  Laterality: Left;  . IR ANGIO INTRA EXTRACRAN SEL COM CAROTID INNOMINATE BILAT MOD SED  01/05/2017  . IR ANGIO VERTEBRAL SEL VERTEBRAL BILAT MOD SED  01/05/2017  . IR RADIOLOGIST EVAL & MGMT  12/30/2016  . TUBAL LIGATION  1987  . VAGINAL HYSTERECTOMY  2009    Current Outpatient Medications  Medication Sig Dispense Refill  . ALPRAZolam (XANAX) 1 MG tablet Take 0.5-1 mg by mouth at bedtime.     [name] Kitchen aspirin  EC 81 MG tablet Take 81 mg by mouth every evening.     [name] Kitchen atorvastatin (LIPITOR) 80 MG tablet Take 1 tablet (80 mg total) by mouth daily. 90 tablet 1  . calcium carbonate (TUMS - DOSED IN MG ELEMENTAL CALCIUM) 500 MG chewable tablet Chew 500 mg by mouth 2 (two) times daily as needed for indigestion or heartburn.     . ezetimibe (ZETIA) 10 MG tablet Take 1 tablet (10 mg total) by mouth daily. 30 tablet 11  . famotidine (PEPCID) 20 MG tablet Take 2 tablets (40 mg total) by mouth at bedtime. (Patient  taking differently: Take 20 mg by mouth 2 (two) times daily. )    . furosemide (LASIX) 20 MG tablet Take 1 tablet (20 mg total) by mouth daily. 30 tablet 0  . isosorbide mononitrate (IMDUR) 30 MG 24 hr tablet Take 1 tablet (30 mg total) by mouth daily. 90 tablet 3  . KLOR-CON M20 20 MEQ tablet TAKE 1 TABLET BY MOUTH ONCE DAILY 90 tablet 3  . LORazepam (ATIVAN) 1 MG tablet Take 1.5 mg by mouth daily.  1  . metoprolol succinate (TOPROL XL) 25 MG 24 hr tablet Take 0.5 tablets (12.5 mg total) by mouth daily. 30 tablet 0  . nitroGLYCERIN (NITROSTAT) 0.4 MG SL tablet DISSOLVE ONE TABLET UNDER THE TONGUE EVERY 5 MINUTES AS NEEDED FOR CHEST PAIN.  DO NOT EXCEED A TOTAL OF 3 DOSES IN 15 MINUTES 25 tablet 3  . omeprazole (PRILOSEC) 20 MG capsule Take 1 capsule by mouth daily.  0  . rOPINIRole (REQUIP) 0.5 MG tablet Take 1 tablet by mouth at bedtime.  0  . sacubitril-valsartan (ENTRESTO) 24-26 MG Take 1 tablet by mouth 2 (two) times daily. 180 tablet 3   No current facility-administered medications for this visit.     Allergies as of 12/20/2017 - Review Complete 12/20/2017  Allergen Reaction Noted  . Tape Other (See Comments) 01/03/2017    Family History  Problem Relation Age of Onset  . Diabetes Father   . Hypertension Father   . CAD Father   . Colon polyps Father 60       pre-cancerous   . Stroke Mother   . Hypertension Mother   . Diabetes Mother   . Heart failure Other   . Breast cancer Maternal Grandmother   . Colon cancer Neg Hx     Social History   Socioeconomic History  . Marital status: Married    Spouse name: Not on file  . Number of children: Not on file  . Years of education: Not on file  . Highest education level: Not on file  Occupational History  . Occupation: CNA  Social Needs  . Financial resource strain: Not on file  . Food insecurity:    Worry: Not on file    Inability: Not on file  . Transportation needs:    Medical: Not on file    Non-medical: Not on file    Tobacco Use  . Smoking status: Current Some Day Smoker    Packs/day: 0.10    Years: 15.00    Pack years: 1.50    Types: Cigarettes  . Smokeless tobacco: Never Used  . Tobacco comment: 1-2 cigs per week, wearing patch 12/20/17  Substance and Sexual Activity  . Alcohol use: Yes    Alcohol/week: 6.0 standard drinks    Types: 6 Cans of beer per week    Comment: three times a week, will have glass of wine and beer   .  Drug use: Not Currently    Types: Marijuana    Comment: former  . Sexual activity: Not Currently    Birth control/protection: Surgical  Lifestyle  . Physical activity:    Days per week: Not on file    Minutes per session: Not on file  . Stress: Not on file  Relationships  . Social connections:    Talks on phone: Not on file    Gets together: Not on file    Attends religious service: Not on file    Active member of club or organization: Not on file    Attends meetings of clubs or organizations: Not on file    Relationship status: Not on file  . Intimate partner violence:    Fear of current or ex partner: Not on file    Emotionally abused: Not on file    Physically abused: Not on file    Forced sexual activity: Not on file  Other Topics Concern  . Not on file  Social History Narrative   Lives with husband in [place] in a one story home with a basement.  Has 4 children.  Works as a Quarry manager.  Education: CNA school.     Review of Systems: Gen: Denies any fever, chills, fatigue, weight loss, lack of appetite.  CV: Denies chest pain, heart palpitations, peripheral edema, syncope.  Resp: Denies shortness of breath at rest or with exertion. Denies wheezing or cough.  GI: see HPI  GU : Denies urinary burning, urinary frequency, urinary hesitancy MS: Denies joint pain, muscle weakness, cramps, or limitation of movement.  Derm: Denies rash, itching, dry skin Psych: Denies depression, anxiety, memory loss, and confusion Heme: Denies bruising, bleeding, and enlarged lymph  nodes.  Physical Exam: BP 109/78   Pulse 82   Temp 97.6 F (36.4 C) (Oral)   Ht 5' (1.524 m)   Wt 123 lb 9.6 oz (56.1 kg)   BMI 24.14 kg/m  General:   Alert and oriented. Pleasant and cooperative. Well-nourished and well-developed.  Head:  Normocephalic and atraumatic. Eyes:  Without icterus, sclera clear and conjunctiva pink.  Ears:  Normal auditory acuity. Nose:  No deformity, discharge,  or lesions. Mouth:  No deformity or lesions, oral mucosa pink.  Lungs:  Clear to auscultation bilaterally. No wheezes, rales, or rhonchi. No distress.  Heart:  S1, S2 present without murmurs appreciated.  Abdomen:  +BS, soft, mild discomfort lower abdomen and non-distended. No HSM noted. No guarding or rebound. No masses appreciated.  Rectal:  Deferred  Msk:  Symmetrical without gross deformities. Normal posture. Extremities:  Without  edema. Neurologic:  Alert and  oriented x4 Psych:  Alert and cooperative. Normal mood and affect.

## 2017-12-20 NOTE — Patient Instructions (Signed)
We are tentatively holding a spot for a colonoscopy with Dr. Gala Romney in the near future.   First, we need to check stool studies. Please complete as soon as possible and take to the lab.  I recommend taking a probiotic daily such as Digestive Advantage, [employer]ips Colon Health, Restora, Align, etc.  Further recommendations to follow!  It was a pleasure to see you today. I strive to create trusting relationships with patients to provide genuine, compassionate, and quality care. I value your feedback. If you receive a survey regarding your visit,  I greatly appreciate you taking time to fill this out.   Annitta Needs, PhD, ANP-BC [facility]

## 2017-12-20 NOTE — Progress Notes (Signed)
CC'ED TO PCP 

## 2017-12-20 NOTE — Assessment & Plan Note (Signed)
57 year old female presenting with need for initial screening colonoscopy and will need Propofol due to polypharmacy. However, she just completed two rounds of antibiotics and now with multiple postprandial looser stools per day, mucus, abdominal cramping. 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 High risk for CDI, especially in light of recent antibiotic exposure.  Check stool studies now, Cdiff toxin assay and GDH, PCR, stool culture, Giardia Start probiotic Will tentatively arrange colonoscopy in the future with Propofol by Dr. Gala Romney and plan on this unless CDI positive. Risks, benefits discussed with stated understanding.  Will start Bentyl if negative CDI as she may just have antibiotic-associated diarrhea.

## 2017-12-20 NOTE — Progress Notes (Signed)
cc'ed to pcp °

## 2017-12-21 ENCOUNTER — Encounter: Payer: Self-pay | Admitting: Obstetrics & Gynecology

## 2017-12-21 ENCOUNTER — Telehealth: Payer: Self-pay | Admitting: *Deleted

## 2017-12-21 ENCOUNTER — Ambulatory Visit (INDEPENDENT_AMBULATORY_CARE_PROVIDER_SITE_OTHER): Payer: Managed Care, Other (non HMO) | Admitting: Obstetrics & Gynecology

## 2017-12-21 VITALS — BP 97/64 | HR 92 | Ht 60.0 in | Wt 123.5 lb

## 2017-12-21 DIAGNOSIS — Z09 Encounter for follow-up examination after completed treatment for conditions other than malignant neoplasm: Secondary | ICD-10-CM

## 2017-12-21 DIAGNOSIS — A5901 Trichomonal vulvovaginitis: Secondary | ICD-10-CM | POA: Diagnosis not present

## 2017-12-21 DIAGNOSIS — N952 Postmenopausal atrophic vaginitis: Secondary | ICD-10-CM | POA: Diagnosis not present

## 2017-12-21 MED ORDER — ESTROGENS, CONJUGATED 0.625 MG/GM VA CREA: TOPICAL_CREAM | VAGINAL | 12 refills | Status: DC

## 2017-12-21 NOTE — Telephone Encounter (Signed)
-----   Message from Erma Heritage, [place] sent at 12/21/2017  7:20 AM EDT ----- Please let the patient know that her repeat echocardiogram shows some improvement in the pumping function of her heart.  EF was previously 35 to 40% by imaging in 10/2016, improved slightly to 40-45%. Wall motion similar to prior imaging. No significant valvular abnormalities. Please forward a copy to [name], MD. Thank you.

## 2017-12-21 NOTE — Progress Notes (Signed)
       Chief Complaint  Patient presents with  . Follow-up    POC trich    Blood pressure 97/64, pulse 92, height 5' (1.524 m), weight 123 lb 8 oz (56 kg).  57 y.o. [id number] No LMP recorded. Patient has had a hysterectomy. The current method of family planning is status post hysterectomy.  Subjective S/p metronidazole x 7 days for trichomonas treatment Vaginal discharge for resolved Itching no Irritation no Odor no Similar to previous no  Previous treatment flagyl 500 BID x 7 days  Objective Vulva:  normal appearing vulva with no masses, tenderness or lesions Vagina:  normal mucosa, no discharge Cervix:  absent Uterus:  uterus absent Adnexa: ovaries:not present,  Normal no masses    Pertinent ROS No burning with urination, frequency or urgency No nausea, vomiting or diarrhea Nor fever chills or other constitutional symptoms   Labs or studies Wet Prep:   A sample of vaginal discharge was obtained from the posterior fornix using a cotton swab. 2 drops of saline were placed on a slide and the cotton swab was immersed in the saline. Microscopic evaluation was performed and results were as follows:  Positive  for yeast  Positive for clue cells , consistent with Bacterial vaginosis Negative for trichomonas  Normal WBC population   Whiff test: Negative     Impression Diagnoses this Encounter::   ICD-10-CM   1. Trichomonal vaginitis A59.01   2. Atrophic vaginitis N95.2     Established relevant diagnosis(es): Recently treated trichomonas  Plan/Recommendations: Meds ordered this encounter  Medications  . conjugated estrogens (PREMARIN) vaginal cream    Sig: Use 1 gram nightly    Dispense:  30 g    Refill:  12    Labs or Scans Ordered: No orders of the defined types were placed in this encounter.   Management:: Trichomonas has been effectively treated  Begin premarin vaginal cream for improving vaginal health  Follow up Return if symptoms worsen  or fail to improve.    All questions were answered.

## 2017-12-21 NOTE — Telephone Encounter (Signed)
Called patient with test results. No answer. Left message to call back.  

## 2017-12-25 ENCOUNTER — Telehealth: Payer: Self-pay | Admitting: Internal Medicine

## 2017-12-25 MED ORDER — METRONIDAZOLE 500 MG PO TABS: 500.0000 mg | ORAL_TABLET | Freq: Three times a day (TID) | ORAL | 0 refills | Status: AC

## 2017-12-25 NOTE — Telephone Encounter (Signed)
[phone] patient called and said she can not afford the antibiotic we prescribed and wants to know if there is something else that can be called in .

## 2017-12-25 NOTE — Telephone Encounter (Signed)
Can we touch base with [facility] and see if they can prepare this (maybe even the liquid would be better), to be more cost-effective?  It would be vancomycin 125 mg oral QID for 14 days.

## 2017-12-25 NOTE — Telephone Encounter (Signed)
AB spoke with [facility], they can make the oral medication for $75.00. Spoke with pts spouse and it is still too costly for the pt.

## 2017-12-25 NOTE — Telephone Encounter (Signed)
Spoke with pt and the cost of Antibiotic is $173.00. Pt isn't able to afford this price.

## 2017-12-25 NOTE — Telephone Encounter (Signed)
Noted. Pts spouse was notified of Medication change.

## 2017-12-25 NOTE — Addendum Note (Signed)
Addended by: Annitta Needs on: 12/25/2017 01:36 PM   Modules accepted: Orders

## 2017-12-25 NOTE — Telephone Encounter (Signed)
Ok. First line treatment for CDI is vancomycin. Per guidelines, if unable to obtain, metronidazole could be used, which has been used historically.   I am sending in metronidazole 500 mg oral TID for 14 days.

## 2017-12-26 ENCOUNTER — Telehealth: Payer: Self-pay

## 2017-12-26 ENCOUNTER — Telehealth: Payer: Self-pay | Admitting: Gastroenterology

## 2017-12-26 ENCOUNTER — Encounter: Payer: Self-pay | Admitting: Internal Medicine

## 2017-12-26 LAB — STOOL CULTURE
MICRO NUMBER:: 90981057
MICRO NUMBER:: 90981058
MICRO NUMBER:: 90981059
SHIGA RESULT:: NOT DETECTED
SPECIMEN QUALITY:: ADEQUATE
SPECIMEN QUALITY:: ADEQUATE
SPECIMEN QUALITY:: ADEQUATE

## 2017-12-26 LAB — GIARDIA ANTIGEN
MICRO NUMBER:: 90978300
RESULT:: NOT DETECTED
SPECIMEN QUALITY:: ADEQUATE

## 2017-12-26 LAB — C. DIFFICILE GDH AND TOXIN A/B
GDH ANTIGEN: DETECTED
MICRO NUMBER:: 90979511
SPECIMEN QUALITY:: ADEQUATE
TOXIN A AND B: DETECTED

## 2017-12-26 NOTE — Telephone Encounter (Signed)
PATIENT SCHEDULED AND LETTER SENT  °

## 2017-12-26 NOTE — Telephone Encounter (Signed)
Spot held on 03/08/18 at 8:30am. Patient aware she will get instructions when she comes in for OV.

## 2017-12-26 NOTE — Telephone Encounter (Signed)
Patient with +CDI. I believe we had been holding a slot tentatively for colonoscopy. We can cancel that now. She is being treated.  Stacey: please have her return in about 6-8 weeks, may use urgent. If can't find one, let me know.  [facility] clinical pool: can we tentatively put on the schedule for farther out, so she has a slot when I see her and it's just updating H&P

## 2017-12-26 NOTE — Telephone Encounter (Signed)
Erin [name] at [facility] called office and requested office note from 12/20/17 and stool study result. Clinical notes faxed to [phone].

## 2017-12-27 IMAGING — CR DG CHEST 1V PORT
1 series · 1 of 1 positions shown · non-contrast
Comparison: 03/11/2016

CLINICAL DATA: Pt having SOB, no cp. Hx of hypertension. Cardiac
cath 03/11/16. Smoker for 11 years.

EXAM:
PORTABLE CHEST 1 VIEW

[AP]
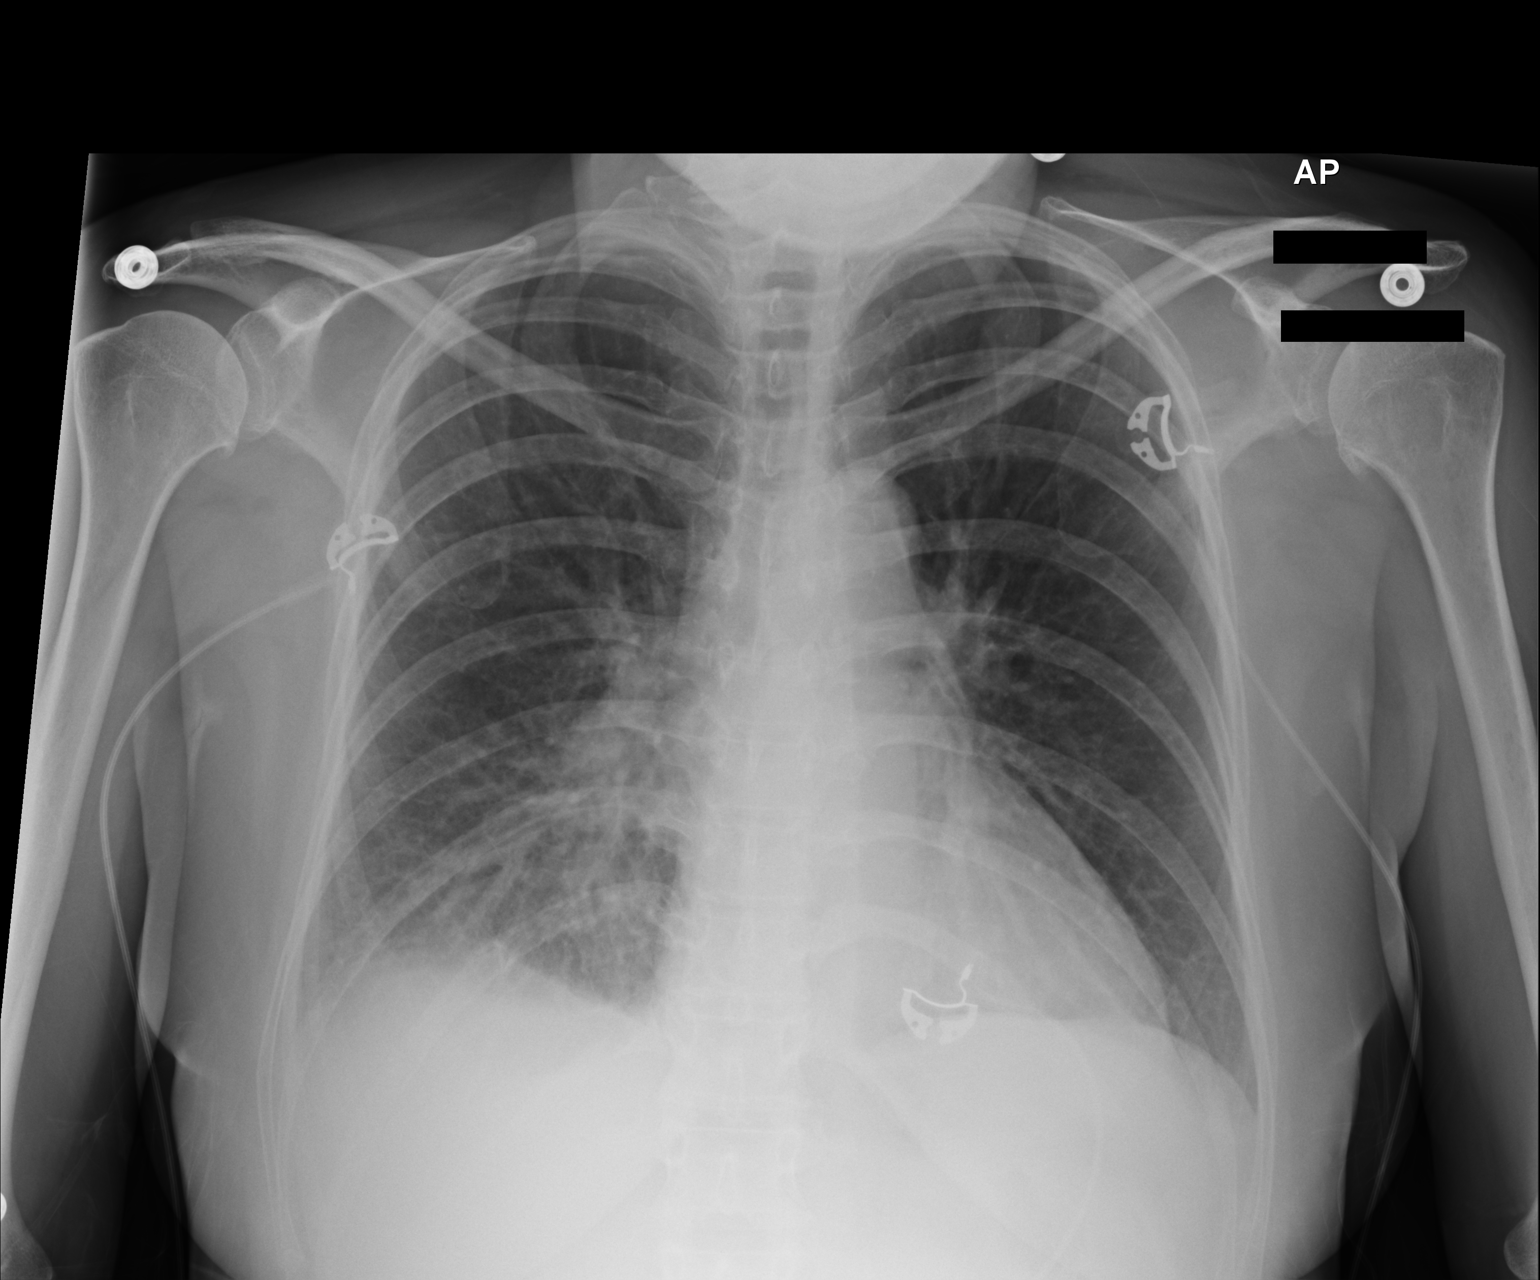

[1 of 1 positions shown; findings below may reference images not displayed]

FINDINGS: Opacity has developed at the right lung base since the prior study.
This is likely atelectasis. Consider pneumonia if there are
consistent clinical symptoms.

Remainder of the lungs is clear. No convincing pleural effusion. No
pneumothorax.

Heart, mediastinum and hila are unremarkable.
IMPRESSION: 1. New right lung base opacity most likely atelectasis. Pneumonia is
possible.

## 2018-01-01 ENCOUNTER — Other Ambulatory Visit: Payer: Self-pay

## 2018-01-01 ENCOUNTER — Encounter (HOSPITAL_COMMUNITY): Payer: Self-pay | Admitting: Emergency Medicine

## 2018-01-01 ENCOUNTER — Emergency Department (HOSPITAL_COMMUNITY)
Admission: EM | Admit: 2018-01-01 | Discharge: 2018-01-01 | Disposition: A | Discharge status: Home / Self Care | Payer: Managed Care, Other (non HMO) | Attending: Emergency Medicine | Admitting: Emergency Medicine

## 2018-01-01 DIAGNOSIS — I252 Old myocardial infarction: Secondary | ICD-10-CM | POA: Diagnosis not present

## 2018-01-01 DIAGNOSIS — L02414 Cutaneous abscess of left upper limb: Secondary | ICD-10-CM | POA: Insufficient documentation

## 2018-01-01 DIAGNOSIS — Z79899 Other long term (current) drug therapy: Secondary | ICD-10-CM | POA: Insufficient documentation

## 2018-01-01 DIAGNOSIS — F419 Anxiety disorder, unspecified: Secondary | ICD-10-CM | POA: Diagnosis not present

## 2018-01-01 DIAGNOSIS — I110 Hypertensive heart disease with heart failure: Secondary | ICD-10-CM | POA: Diagnosis not present

## 2018-01-01 DIAGNOSIS — F17210 Nicotine dependence, cigarettes, uncomplicated: Secondary | ICD-10-CM | POA: Diagnosis not present

## 2018-01-01 DIAGNOSIS — L0291 Cutaneous abscess, unspecified: Secondary | ICD-10-CM

## 2018-01-01 DIAGNOSIS — Z7982 Long term (current) use of aspirin: Secondary | ICD-10-CM | POA: Insufficient documentation

## 2018-01-01 DIAGNOSIS — R2232 Localized swelling, mass and lump, left upper limb: Secondary | ICD-10-CM | POA: Diagnosis present

## 2018-01-01 DIAGNOSIS — Z9049 Acquired absence of other specified parts of digestive tract: Secondary | ICD-10-CM | POA: Diagnosis not present

## 2018-01-01 DIAGNOSIS — I2510 Atherosclerotic heart disease of native coronary artery without angina pectoris: Secondary | ICD-10-CM | POA: Diagnosis not present

## 2018-01-01 DIAGNOSIS — Z955 Presence of coronary angioplasty implant and graft: Secondary | ICD-10-CM | POA: Diagnosis not present

## 2018-01-01 DIAGNOSIS — I5042 Chronic combined systolic (congestive) and diastolic (congestive) heart failure: Secondary | ICD-10-CM | POA: Diagnosis not present

## 2018-01-01 DIAGNOSIS — Z85828 Personal history of other malignant neoplasm of skin: Secondary | ICD-10-CM | POA: Diagnosis not present

## 2018-01-01 IMAGING — DX DG ABDOMEN ACUTE W/ 1V CHEST
4 series · 4 of 4 positions shown · non-contrast
Comparison: 03/12/2016 chest radiograph

CLINICAL DATA: 54 y/o F; weakness, dizziness, and low blood
pressure with recent cardiac stent placement.

EXAM:
DG ABDOMEN ACUTE W/ 1V CHEST

[chest pa]
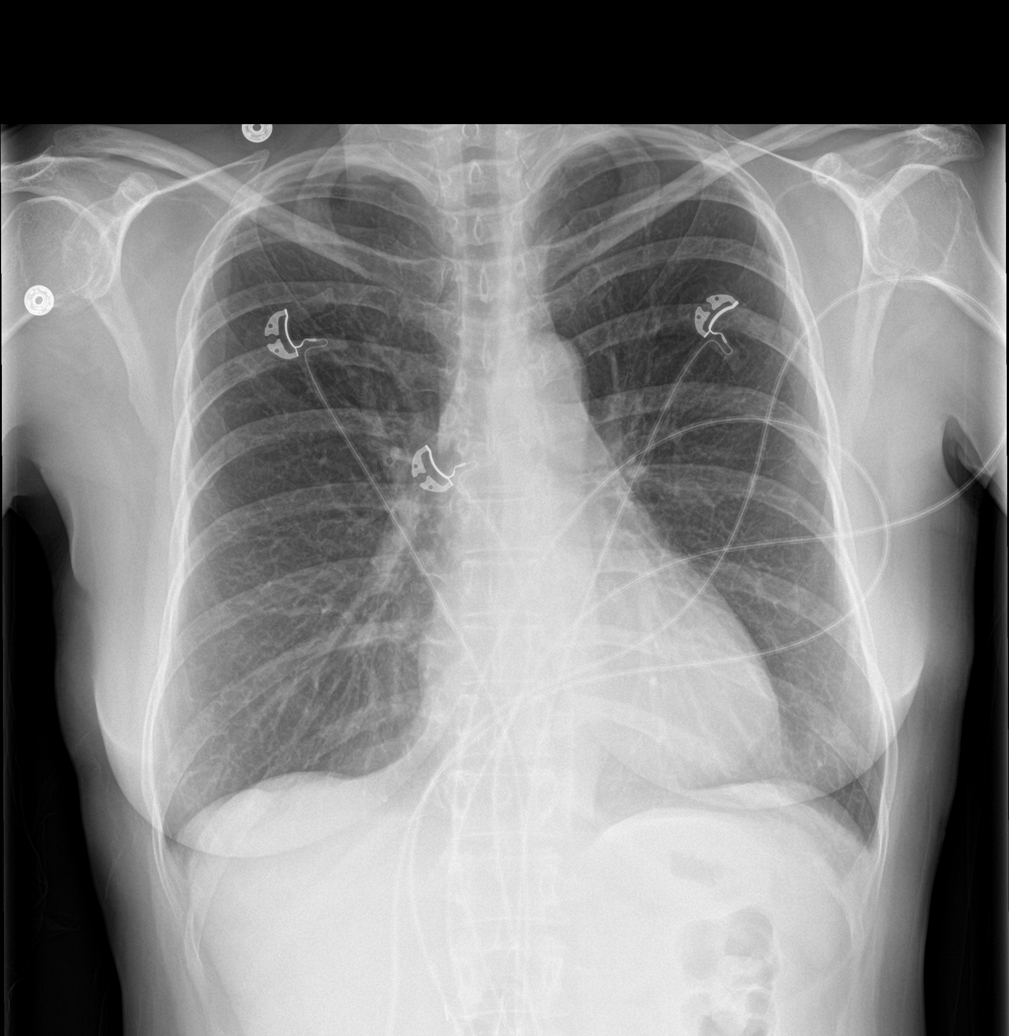

[abdomen erect]
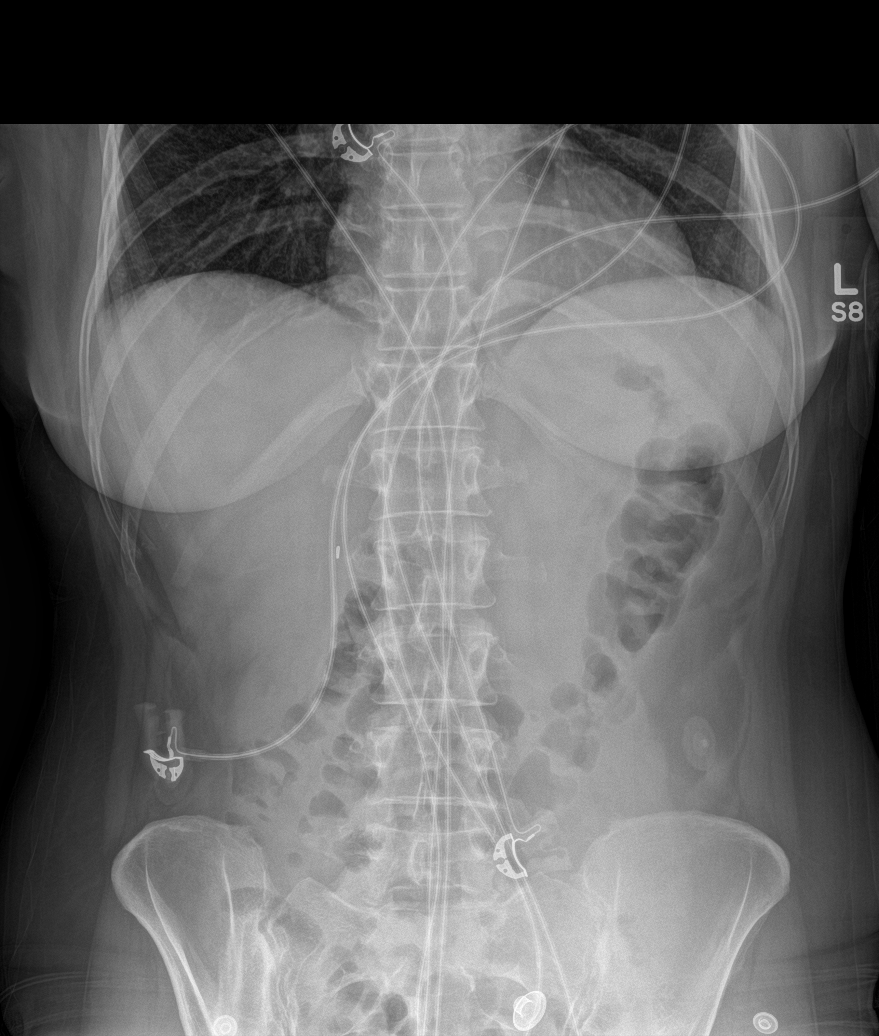

[abdomen supine (1 of 2)]
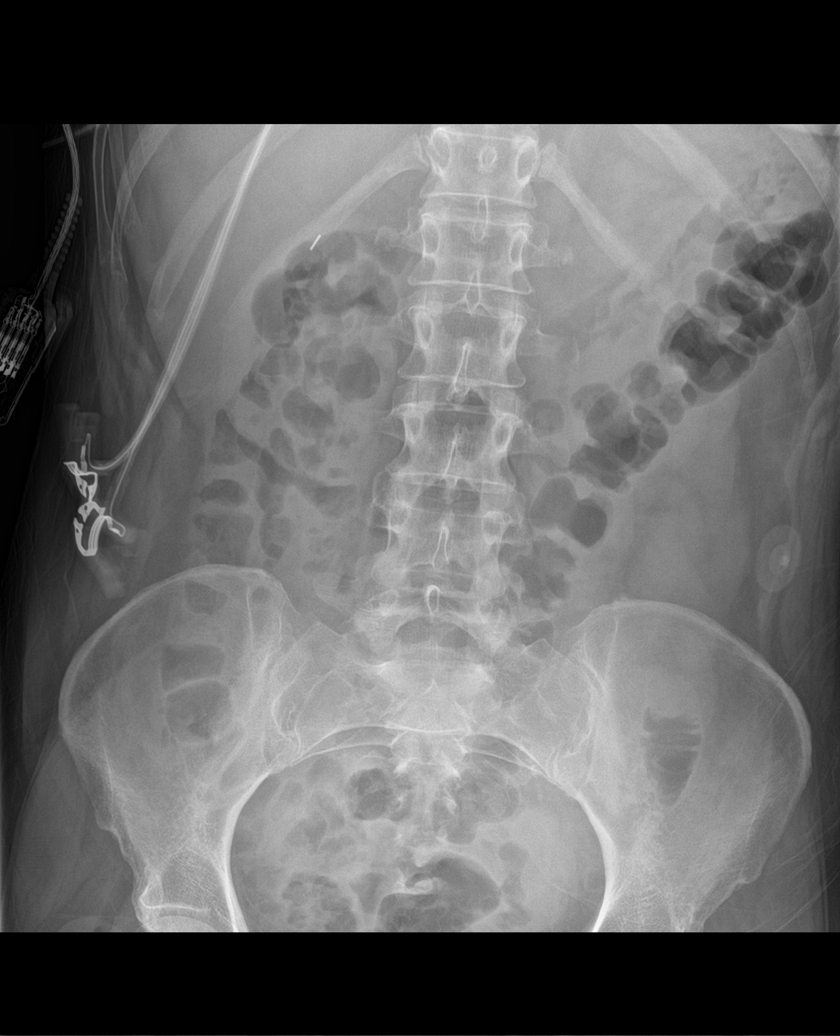

[abdomen supine (2 of 2)]
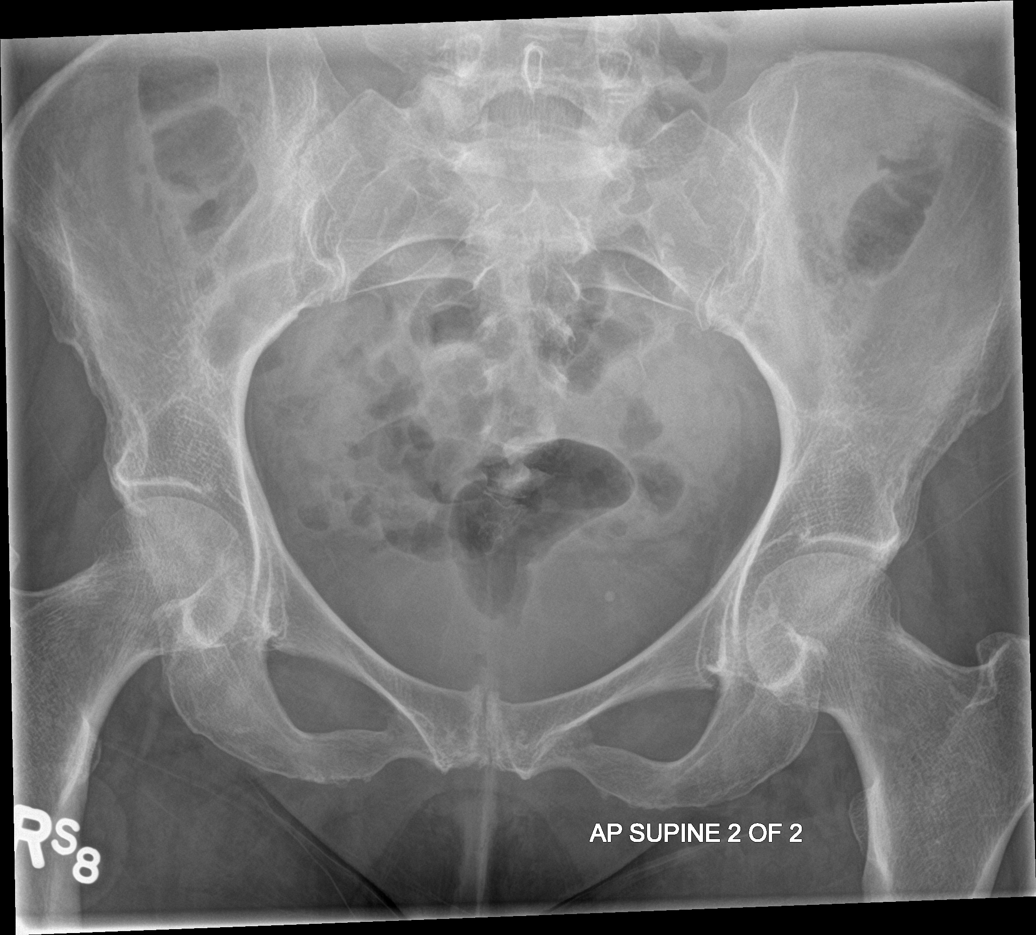

[4 of 4 positions shown; findings below may reference images not displayed]

FINDINGS: There is no evidence of dilated bowel loops or free intraperitoneal
air. No radiopaque calculi or other significant radiographic
abnormality is seen. Heart size and mediastinal contours are within
normal limits. Both lungs are clear, interval resolution of right
lung base opacity. Right upper quadrant surgical clips, presumably
cholecystectomy. Degenerative changes of the symphysis pubis.
IMPRESSION: Negative abdominal radiographs.  No acute cardiopulmonary disease.

By: Lusine Jim M.D.

## 2018-01-01 MED ORDER — MUPIROCIN CALCIUM 2 % EX CREA: 1.0000 "application " | TOPICAL_CREAM | Freq: Two times a day (BID) | CUTANEOUS | 0 refills | Status: DC

## 2018-01-01 NOTE — ED Triage Notes (Signed)
Swollen red area to lt wrist.  Was told mrsa has to be r/o before pt can go back to work.

## 2018-01-03 NOTE — ED Provider Notes (Signed)
[facility] EMERGENCY DEPARTMENT Provider Note   CSN: [id number] Arrival date & time: 01/01/18  1058     History   Chief Complaint Chief Complaint  Patient presents with  . Abscess    HPI Erin [name] is a 57 y.o. female.  The history is provided by the patient. No language interpreter was used.  Abscess  Location:  Hand Hand abscess location:  L wrist Size:  2 mm Abscess quality: redness and warmth   Red streaking: no   Progression:  Worsening Chronicity:  New Worsened by:  Nothing Ineffective treatments:  None tried Associated symptoms: nausea   Associated symptoms: no fever     Past Medical History:  Diagnosis Date  . Anxiety state 03/25/2016  . Basal cell carcinoma of forehead   . CHF (congestive heart failure) ([place])   . Coronary artery disease    a. 03/11/16 PCI with DES-->Prox/Mid Cx;  b. 03/14/16 PCI with DES x2-->RCA, EF 30-35%.  . Essential hypertension   . GERD (gastroesophageal reflux disease)   . HFrEF (heart failure with reduced ejection fraction) ([place])    a. 10/2016 Echo: EF 35-40%, Gr1 DD, mild focal basal septal hypertrophy, basal inflat, mid inflat, basal antlat AK. Mid infept/inf/antlat, apical lateral sev HK. Mod MR. mildly reduced RV fxn. Mild TR.  [name] Kitchen History of pneumonia   . Hyperlipidemia   . Ischemic cardiomyopathy    a. 10/2016 Echo: EF 35-40%, Gr1 DD.  [name] Kitchen Mitral regurgitation   . NSTEMI (non-ST elevated myocardial infarction) ([place]) 03/10/2016  . Squamous cell cancer of skin of nose   . Thrombocytosis ([place]) 03/26/2016  . Tobacco abuse   . Trichimoniasis   . Wears dentures   . Wears glasses     Patient Active Problem List   Diagnosis Date Noted  . Frequent stools 12/20/2017  . Chronic combined systolic and diastolic CHF (congestive heart failure) ([place]) 06/20/2016  . Hypokalemia   . Acute CHF (congestive heart failure) ([place]) 05/16/2016  . Acute on chronic systolic CHF (congestive heart failure) ([place]) 05/16/2016  . Acute respiratory  failure with hypoxia ([place])   . Thrombocytosis ([place]) 03/26/2016  . Cardiomyopathy, ischemic 03/25/2016  . Acute on chronic combined systolic and diastolic congestive heart failure ([place]) 03/25/2016  . Anxiety state 03/25/2016  . Troponin level elevated 03/25/2016  . CAD (coronary artery disease), native coronary artery 03/25/2016  . Normocytic anemia 03/25/2016  . SOB (shortness of breath) 03/24/2016  . Lightheadedness 03/17/2016  . Hypotension 03/17/2016  . Tobacco abuse 03/12/2016  . NSTEMI (non-ST elevated myocardial infarction) ([place]) 03/11/2016  . Pain in the chest   . Essential hypertension 09/06/2015  . Mixed hyperlipidemia 09/06/2015  . GERD (gastroesophageal reflux disease) 09/06/2015  . Chest pain 09/06/2015    Past Surgical History:  Procedure Laterality Date  . APPENDECTOMY    . CARDIAC CATHETERIZATION N/A 03/11/2016   Procedure: Left Heart Cath and Coronary Angiography;  Surgeon: Leonie Man, MD;  Location: [place] [facility];  Service: Cardiovascular;  Laterality: N/A;  . CARDIAC CATHETERIZATION N/A 03/11/2016   Procedure: Coronary Stent Intervention;  Surgeon: Leonie Man, MD;  Location: [place] [facility];  Service: Cardiovascular;  Laterality: N/A;  . CARDIAC CATHETERIZATION N/A 03/14/2016   Procedure: Coronary Stent Intervention;  Surgeon: Peter M Martinique, MD;  Location: [place] [facility];  Service: Cardiovascular;  Laterality: N/A;  . CHOLECYSTECTOMY OPEN  1984  . CORONARY ANGIOPLASTY WITH STENT PLACEMENT  03/14/2016  . FINGER ARTHROPLASTY Left 05/14/2013  Procedure: LEFT THUMB CARPAL METACARPAL ARTHROPLASTY;  Surgeon: Tennis Must, MD;  Location: [place];  Service: Orthopedics;  Laterality: Left;  . IR ANGIO INTRA EXTRACRAN SEL COM CAROTID INNOMINATE BILAT MOD SED  01/05/2017  . IR ANGIO VERTEBRAL SEL VERTEBRAL BILAT MOD SED  01/05/2017  . IR RADIOLOGIST EVAL & MGMT  12/30/2016  . TUBAL LIGATION  1987  . VAGINAL HYSTERECTOMY  2009     OB  History    Gravida  4   Para  4   Term  4   Preterm      AB      Living  4     SAB      TAB      Ectopic      Multiple      Live Births               Home Medications    Prior to Admission medications   Medication Sig Start Date End Date Taking? Authorizing Provider  ALPRAZolam Duanne Moron) 1 MG tablet Take 0.5-1 mg by mouth at bedtime.     [provider]  aspirin EC 81 MG tablet Take 81 mg by mouth every evening.     [provider]  atorvastatin (LIPITOR) 80 MG tablet Take 1 tablet (80 mg total) by mouth daily. 09/01/17   Herminio Commons, MD  calcium carbonate (TUMS - DOSED IN MG ELEMENTAL CALCIUM) 500 MG chewable tablet Chew 500 mg by mouth 2 (two) times daily as needed for indigestion or heartburn.     [provider]  conjugated estrogens (PREMARIN) vaginal cream Use 1 gram nightly 12/21/17   Florian Buff, MD  ezetimibe (ZETIA) 10 MG tablet Take 1 tablet (10 mg total) by mouth daily. 07/14/17   Herminio Commons, MD  famotidine (PEPCID) 20 MG tablet Take 2 tablets (40 mg total) by mouth at bedtime. Patient taking differently: Take 40 mg by mouth 2 (two) times daily.  08/01/17   Barton Dubois, MD  furosemide (LASIX) 20 MG tablet Take 1 tablet (20 mg total) by mouth daily. 11/17/17   Sherwood Gambler, MD  isosorbide mononitrate (IMDUR) 30 MG 24 hr tablet Take 30 mg by mouth daily.    [provider]  KLOR-CON M20 20 MEQ tablet TAKE 1 TABLET BY MOUTH ONCE DAILY 04/11/17   Herminio Commons, MD  LORazepam (ATIVAN) 1 MG tablet Take 1.5 mg by mouth daily. 11/09/17   [provider]  metoprolol succinate (TOPROL XL) 25 MG 24 hr tablet Take 0.5 tablets (12.5 mg total) by mouth daily. 11/17/17   Sherwood Gambler, MD  metroNIDAZOLE (FLAGYL) 500 MG tablet Take 1 tablet (500 mg total) by mouth 3 (three) times daily for 14 days. 12/25/17 01/08/18  Annitta Needs, NP  mupirocin cream (BACTROBAN) 2 % Apply 1 application topically 2 (two)  times daily. 01/01/18   Fransico Meadow, PA-C  nitroGLYCERIN (NITROSTAT) 0.4 MG SL tablet DISSOLVE ONE TABLET UNDER THE TONGUE EVERY 5 MINUTES AS NEEDED FOR CHEST PAIN.  DO NOT EXCEED A TOTAL OF 3 DOSES IN 15 MINUTES 09/07/16   Josue Hector, MD  omeprazole (PRILOSEC) 20 MG capsule Take 1 capsule by mouth daily. 11/13/17   [provider]  rOPINIRole (REQUIP) 0.5 MG tablet Take 1 tablet by mouth at bedtime. 11/13/17   [provider]  sacubitril-valsartan (ENTRESTO) 24-26 MG Take 1 tablet by mouth 2 (two) times daily. 12/04/17   Herminio Commons,  MD    Family History Family History  Problem Relation Age of Onset  . Diabetes Father   . Hypertension Father   . CAD Father   . Colon polyps Father 60       pre-cancerous   . Stroke Mother   . Hypertension Mother   . Diabetes Mother   . Heart failure Other   . Breast cancer Maternal Grandmother   . Colon cancer Neg Hx     Social History Social History   Tobacco Use  . Smoking status: Current Some Day Smoker    Packs/day: 0.10    Years: 15.00    Pack years: 1.50    Types: Cigarettes  . Smokeless tobacco: Never Used  . Tobacco comment: 1-2 cigs per week, wearing patch 12/20/17  Substance Use Topics  . Alcohol use: Yes    Alcohol/week: 6.0 standard drinks    Types: 6 Cans of beer per week    Comment: three times a week, will have glass of wine and beer   . Drug use: Not Currently    Types: Marijuana    Comment: former     Allergies   Tape   Review of Systems Review of Systems  Constitutional: Negative for fever.  Gastrointestinal: Positive for nausea.  Skin: Positive for wound.  All other systems reviewed and are negative.    Physical Exam Updated Vital Signs BP 118/79 (BP Location: Right Arm)   Pulse 80   Temp 98.1 F (36.7 C) (Oral)   Resp 18   Ht 5' (1.524 m)   Wt 56 kg   SpO2 100%   BMI 24.11 kg/m   Physical Exam  Constitutional: She appears well-developed.  Cardiovascular: Normal  rate.  Pulmonary/Chest: Effort normal.  Musculoskeletal:  42mm pustule wrist  Neurological: She is alert.  Skin: Skin is warm.  Psychiatric: She has a normal mood and affect.  Nursing note and vitals reviewed.    ED Treatments / Results  Labs (all labs ordered are listed, but only abnormal results are displayed) Labs Reviewed  AEROBIC CULTURE (SUPERFICIAL SPECIMEN)    EKG None  Radiology No results found.  Procedures Procedures (including critical care time)  Medications Ordered in ED Medications - No data to display   Initial Impression / Assessment and Plan / ED Course  I have reviewed the triage vital signs and the nursing notes.  Pertinent labs & imaging results that were available during my care of the patient were reviewed by me and considered in my medical decision making (see chart for details).     Pt has to have a culture of wound before she can return to work. An After Visit Summary was printed and given to the patient.   Final Clinical Impressions(s) / ED Diagnoses   Final diagnoses:  Abscess    ED Discharge Orders         Ordered    mupirocin cream (BACTROBAN) 2 %  2 times daily     01/01/18 1200           Fransico Meadow, [place] 01/03/18 1705    Julianne Rice, MD 01/06/18 1505

## 2018-01-04 LAB — AEROBIC CULTURE  (SUPERFICIAL SPECIMEN)
Gram Stain: NONE SEEN
Special Requests: NORMAL

## 2018-01-04 LAB — AEROBIC CULTURE W GRAM STAIN (SUPERFICIAL SPECIMEN)

## 2018-01-05 ENCOUNTER — Telehealth: Payer: Self-pay | Admitting: *Deleted

## 2018-01-05 NOTE — Progress Notes (Signed)
ED Antimicrobial Stewardship Positive Culture Follow Up   Erin [name] is an 57 y.o. female who presented to [facility] on 01/01/2018 with a chief complaint of  Chief Complaint  Patient presents with  . Abscess    Recent Results (from the past 720 hour(s))  Stool culture     Status: None   Collection Time: 12/22/17 11:12 AM  Result Value Ref Range Status   MICRO NUMBER: [id number]  Final   SPECIMEN QUALITY: ADEQUATE  Final   SOURCE: STOOL  Final   STATUS: FINAL  Final   SHIGA RESULT: Not Detected  Final   MICRO NUMBER: [id number]  Final   SPECIMEN QUALITY: ADEQUATE  Final   Source STOOL  Final   STATUS: FINAL  Final   CAM RESULT: No enteric Campylobacter isolated  Final   MICRO NUMBER: [id number]  Final   SPECIMEN QUALITY: ADEQUATE  Final   SOURCE: STOOL  Final   STATUS: FINAL  Final   SS RESULT: No Salmonella or Shigella isolated  Final  Giardia antigen     Status: None   Collection Time: 12/22/17 11:12 AM  Result Value Ref Range Status   MICRO NUMBER: [id number]  Final   SPECIMEN QUALITY: ADEQUATE  Final   Source: STOOL  Final   STATUS: FINAL  Final   RESULT: Not Detected  Final   COMMENT:   Final    NOTE: Due to intermittent shedding, one negative sample does not necessarily rule out the presence of a parasitic infection.  Wound or Superficial Culture     Status: None   Collection Time: 01/01/18 12:00 PM  Result Value Ref Range Status   Specimen Description   Final    SKIN OTHER Performed at [facility], [address] [place], [place] [address]    Special Requests   Final    Normal Performed at [facility], [address] [place], [place] [address]    Gram Stain   Final    NO WBC SEEN NO ORGANISMS SEEN Performed at [facility], [place] [address] [place],  [address]    Culture RARE METHICILLIN RESISTANT STAPHYLOCOCCUS AUREUS  Final   Report Status 01/04/2018 FINAL  Final   Organism ID, Bacteria METHICILLIN RESISTANT STAPHYLOCOCCUS AUREUS  Final   Susceptibility   Methicillin resistant staphylococcus aureus - MIC*    CIPROFLOXACIN >=8 RESISTANT Resistant     ERYTHROMYCIN >=8 RESISTANT Resistant     GENTAMICIN <=0.5 SENSITIVE Sensitive     OXACILLIN >=4 RESISTANT Resistant     TETRACYCLINE >=16 RESISTANT Resistant     VANCOMYCIN <=0.5 SENSITIVE Sensitive     TRIMETH/SULFA <=10 SENSITIVE Sensitive     CLINDAMYCIN <=0.25 SENSITIVE Sensitive     RIFAMPIN <=0.5 SENSITIVE Sensitive     Inducible Clindamycin NEGATIVE Sensitive     * RARE METHICILLIN RESISTANT STAPHYLOCOCCUS AUREUS    []  Treated with N/A, organism resistant to prescribed antimicrobial [x]  Patient discharged originally without antimicrobial agent and treatment is now indicated  New antibiotic prescription: Bactrim DS 1 tablet PO BID x 7 days  ED Provider: Nuala Alpha, PA   Erin [name], Erin [name] 01/05/2018, 9:23 AM Clinical Pharmacist Monday - Friday phone -  [phone] Saturday - Sunday phone - [phone]

## 2018-01-05 NOTE — Telephone Encounter (Signed)
Post ED Visit - Positive Culture Follow-up: Successful Patient Follow-Up  Culture assessed and recommendations reviewed by:  []  Elenor Quinones, Pharm.D. []  Heide Guile, Pharm.D., BCPS AQ-ID []  Parks Neptune, Pharm.D., BCPS []  Alycia Rossetti, Pharm.D., BCPS []  [place], [place].D., BCPS, AAHIVP []  Legrand Como, Pharm.D., BCPS, AAHIVP [x]  Salome Arnt, PharmD, BCPS []  Johnnette Gourd, PharmD, BCPS []  Hughes Better, PharmD, BCPS []  Leeroy Cha, PharmD  Positive wound culture  [x]  Patient discharged without antimicrobial prescription and treatment is now indicated []  Organism is resistant to prescribed ED discharge antimicrobial []  Patient with positive blood cultures  Changes discussed with ED provider: Nuala Alpha, PA-C New antibiotic prescription Bactrim DS 1 tab BID x 7 days Called to [facility] [phone]  Contacted patient, date 01/05/18, time [place], [place] 01/05/2018, 9:59 AM

## 2018-01-11 ENCOUNTER — Telehealth: Payer: Self-pay | Admitting: Emergency Medicine

## 2018-01-11 NOTE — Telephone Encounter (Signed)
Post ED Visit - Positive Culture Follow-up: Successful Patient Follow-Up  Culture assessed and recommendations reviewed by:  []  Elenor Quinones, Pharm.D. []  Heide Guile, Pharm.D., BCPS AQ-ID []  Parks Neptune, Pharm.D., BCPS []  Alycia Rossetti, Pharm.D., BCPS []  [place], Pharm.D., BCPS, AAHIVP []  Legrand Como, Pharm.D., BCPS, AAHIVP [x]  Salome Arnt, PharmD, BCPS []  Johnnette Gourd, PharmD, BCPS []  Hughes Better, PharmD, BCPS []  Leeroy Cha, PharmD  Positive wound culture  [x]  Patient discharged without antimicrobial prescription and treatment is now indicated []  Organism is resistant to prescribed ED discharge antimicrobial []  Patient with positive blood cultures  Changes discussed with ED provider: Nuala Alpha PA New antibiotic prescription start Bactrim DS 1 tab po bid x 7 days Called to n/a patient states has already had rx for same called in  Contacted patient 01/11/2018    Hazle Nordmann 01/11/2018, 11:52 AM

## 2018-02-06 ENCOUNTER — Ambulatory Visit: Payer: Managed Care, Other (non HMO) | Admitting: Cardiovascular Disease

## 2018-02-16 ENCOUNTER — Other Ambulatory Visit: Payer: Self-pay | Admitting: Cardiovascular Disease

## 2018-02-21 ENCOUNTER — Encounter: Payer: Self-pay | Admitting: Cardiovascular Disease

## 2018-02-21 ENCOUNTER — Ambulatory Visit (INDEPENDENT_AMBULATORY_CARE_PROVIDER_SITE_OTHER): Payer: Managed Care, Other (non HMO) | Admitting: Cardiovascular Disease

## 2018-02-21 VITALS — BP 102/62 | HR 84 | Ht 60.0 in | Wt 130.0 lb

## 2018-02-21 DIAGNOSIS — I10 Essential (primary) hypertension: Secondary | ICD-10-CM | POA: Diagnosis not present

## 2018-02-21 DIAGNOSIS — I5042 Chronic combined systolic (congestive) and diastolic (congestive) heart failure: Secondary | ICD-10-CM

## 2018-02-21 DIAGNOSIS — E785 Hyperlipidemia, unspecified: Secondary | ICD-10-CM

## 2018-02-21 DIAGNOSIS — R0602 Shortness of breath: Secondary | ICD-10-CM

## 2018-02-21 DIAGNOSIS — I25118 Atherosclerotic heart disease of native coronary artery with other forms of angina pectoris: Secondary | ICD-10-CM | POA: Diagnosis not present

## 2018-02-21 DIAGNOSIS — Z955 Presence of coronary angioplasty implant and graft: Secondary | ICD-10-CM

## 2018-02-21 DIAGNOSIS — I959 Hypotension, unspecified: Secondary | ICD-10-CM

## 2018-02-21 MED ORDER — LOSARTAN POTASSIUM 25 MG PO TABS: 12.5000 mg | ORAL_TABLET | Freq: Every day | ORAL | 3 refills | Status: DC

## 2018-02-21 NOTE — Patient Instructions (Signed)
Medication Instructions:  Stop ENTRESTO  START LOSARTAN 12.5 MG DAILY (1/2 TABLET)   TAKE EXTRA LASIX & POTASSIUM FORE NEXT FOUR DAYS ( 40 MG OF EACH )  AFTER FOUR DAYS PLEASE GO BACK TO ORIGINAL DOSE OF 20 MG EACH    If you need a refill on your cardiac medications before your next appointment, please call your pharmacy.   Lab work: NONE ORDERED   Testing/Procedures: NONE ORDERED   Follow-Up: At [facility], you and your health needs are our priority.  As part of our continuing mission to provide you with exceptional heart care, we have created designated Provider Care Teams.  These Care Teams include your primary Cardiologist (physician) and Advanced Practice Providers (APPs -  Physician Assistants and Nurse Practitioners) who all work together to provide you with the care you need, when you need it. You will need a follow up appointment in 3 months.  Please call our office 2 months in advance to schedule this appointment.  You may see Kate Sable, MD or one of the following Advanced Practice Providers on your designated Care Team:   Bernerd Pho, PA-C [facility]) . Ermalinda Barrios, PA-C ([place])

## 2018-02-21 NOTE — Progress Notes (Signed)
SUBJECTIVE: The patient presents for routine follow-up.  She has a past medical history of CAD (s/p DES to LCx with staged PCI/DESx2 to RCA/PDA in 03/2016, NST in 03/2017 showing large scar with minimal peri-infarct ischemia), chronic combined systolic and diastolic CHF, HTN, and HLD.  Echocardiogram 12/20/2017 showed a slight improvement in LVEF now up to 40 to 45% with wall motion abnormalities, grade 1 diastolic dysfunction, and mild mitral and tricuspid regurgitation.  There is a small circumferential pericardial effusion.  She continues to feel tired and fatigued with low blood pressure.  Her blood pressure was 98/62 earlier this week.  She checks her blood pressure daily at home and has had systolic readings in the 80 range.  She has been awoken by shortness of breath on 2 separate occasions.  She feels like she just cannot take a full breath in.  She weighs herself daily and she ranges from 125-127 pounds at home.  Weights have overall been stable at home.  She is up 7 pounds by our scales when compared to August readings.  She denies orthopnea.  She has had bilateral feet swelling.  She takes her bra off after work when driving home to alleviate the fullness she experiences in her chest.  She denies exertional chest pain.   Review of Systems: As per "subjective", otherwise negative.  Allergies  Allergen Reactions  . Tape Other (See Comments)    PEELS SKIN OFF  (PAPER TAPE IS FINE)    Current Outpatient Medications  Medication Sig Dispense Refill  . ALPRAZolam (XANAX) 1 MG tablet Take 0.5-1 mg by mouth at bedtime.     [name] Kitchen aspirin EC 81 MG tablet Take 81 mg by mouth every evening.     [name] Kitchen atorvastatin (LIPITOR) 80 MG tablet Take 1 tablet (80 mg total) by mouth daily. 90 tablet 1  . calcium carbonate (TUMS - DOSED IN MG ELEMENTAL CALCIUM) 500 MG chewable tablet Chew 500 mg by mouth 2 (two) times daily as needed for indigestion or heartburn.     . conjugated estrogens (PREMARIN) vaginal  cream Use 1 gram nightly 30 g 12  . ezetimibe (ZETIA) 10 MG tablet Take 1 tablet (10 mg total) by mouth daily. 30 tablet 11  . famotidine (PEPCID) 20 MG tablet Take 2 tablets (40 mg total) by mouth at bedtime. (Patient taking differently: Take 40 mg by mouth 2 (two) times daily. )    . furosemide (LASIX) 20 MG tablet Take 1 tablet (20 mg total) by mouth daily. 30 tablet 0  . isosorbide mononitrate (IMDUR) 30 MG 24 hr tablet Take 30 mg by mouth daily.    [name] Kitchen KLOR-CON M20 20 MEQ tablet TAKE 1 TABLET BY MOUTH ONCE DAILY 90 tablet 3  . LORazepam (ATIVAN) 1 MG tablet Take 1.5 mg by mouth daily.  1  . metoprolol succinate (TOPROL XL) 25 MG 24 hr tablet Take 0.5 tablets (12.5 mg total) by mouth daily. 30 tablet 0  . mupirocin cream (BACTROBAN) 2 % Apply 1 application topically 2 (two) times daily. 15 g 0  . nitroGLYCERIN (NITROSTAT) 0.4 MG SL tablet DISSOLVE ONE TABLET UNDER THE TONGUE EVERY 5 MINUTES AS NEEDED FOR CHEST PAIN.  DO NOT EXCEED A TOTAL OF 3 DOSES IN 15 MINUTES 25 tablet 3  . omeprazole (PRILOSEC) 20 MG capsule Take 1 capsule by mouth daily.  0  . rOPINIRole (REQUIP) 0.5 MG tablet Take 1 tablet by mouth at bedtime.  0  . sacubitril-valsartan (ENTRESTO)  24-26 MG Take 1 tablet by mouth 2 (two) times daily. 180 tablet 3   No current facility-administered medications for this visit.     Past Medical History:  Diagnosis Date  . Anxiety state 03/25/2016  . Basal cell carcinoma of forehead   . CHF (congestive heart failure) ([place])   . Coronary artery disease    a. 03/11/16 PCI with DES-->Prox/Mid Cx;  b. 03/14/16 PCI with DES x2-->RCA, EF 30-35%.  . Essential hypertension   . GERD (gastroesophageal reflux disease)   . HFrEF (heart failure with reduced ejection fraction) ([place])    a. 10/2016 Echo: EF 35-40%, Gr1 DD, mild focal basal septal hypertrophy, basal inflat, mid inflat, basal antlat AK. Mid infept/inf/antlat, apical lateral sev HK. Mod MR. mildly reduced RV fxn. Mild TR.  [name] Kitchen History of  pneumonia   . Hyperlipidemia   . Ischemic cardiomyopathy    a. 10/2016 Echo: EF 35-40%, Gr1 DD.  [name] Kitchen Mitral regurgitation   . NSTEMI (non-ST elevated myocardial infarction) ([place]) 03/10/2016  . Squamous cell cancer of skin of nose   . Thrombocytosis ([place]) 03/26/2016  . Tobacco abuse   . Trichimoniasis   . Wears dentures   . Wears glasses     Past Surgical History:  Procedure Laterality Date  . APPENDECTOMY    . CARDIAC CATHETERIZATION N/A 03/11/2016   Procedure: Left Heart Cath and Coronary Angiography;  Surgeon: Leonie Man, MD;  Location: [facility];  Service: Cardiovascular;  Laterality: N/A;  . CARDIAC CATHETERIZATION N/A 03/11/2016   Procedure: Coronary Stent Intervention;  Surgeon: Leonie Man, MD;  Location: [facility];  Service: Cardiovascular;  Laterality: N/A;  . CARDIAC CATHETERIZATION N/A 03/14/2016   Procedure: Coronary Stent Intervention;  Surgeon: Peter M Martinique, MD;  Location: [facility];  Service: Cardiovascular;  Laterality: N/A;  . CHOLECYSTECTOMY OPEN  1984  . CORONARY ANGIOPLASTY WITH STENT PLACEMENT  03/14/2016  . FINGER ARTHROPLASTY Left 05/14/2013   Procedure: LEFT THUMB CARPAL METACARPAL ARTHROPLASTY;  Surgeon: Tennis Must, MD;  Location: [facility];  Service: Orthopedics;  Laterality: Left;  . IR ANGIO INTRA EXTRACRAN SEL COM CAROTID INNOMINATE BILAT MOD SED  01/05/2017  . IR ANGIO VERTEBRAL SEL VERTEBRAL BILAT MOD SED  01/05/2017  . IR RADIOLOGIST EVAL & MGMT  12/30/2016  . TUBAL LIGATION  1987  . VAGINAL HYSTERECTOMY  2009    Social History   Socioeconomic History  . Marital status: Married    Spouse name: Not on file  . Number of children: Not on file  . Years of education: Not on file  . Highest education level: Not on file  Occupational History  . Occupation: CNA  Social Needs  . Financial resource strain: Not on file  . Food insecurity:    Worry: Not on file    Inability: Not on file  .  Transportation needs:    Medical: Not on file    Non-medical: Not on file  Tobacco Use  . Smoking status: Current Some Day Smoker    Packs/day: 0.10    Years: 15.00    Pack years: 1.50    Types: Cigarettes  . Smokeless tobacco: Never Used  . Tobacco comment: 1-2 cigs per week, wearing patch 12/20/17  Substance and Sexual Activity  . Alcohol use: Yes    Alcohol/week: 6.0 standard drinks    Types: 6 Cans of beer per week    Comment: three times a week, will have glass of  wine and beer   . Drug use: Not Currently    Types: Marijuana    Comment: former  . Sexual activity: Not Currently    Birth control/protection: Surgical    Comment: hyst  Lifestyle  . Physical activity:    Days per week: Not on file    Minutes per session: Not on file  . Stress: Not on file  Relationships  . Social connections:    Talks on phone: Not on file    Gets together: Not on file    Attends religious service: Not on file    Active member of club or organization: Not on file    Attends meetings of clubs or organizations: Not on file    Relationship status: Not on file  . Intimate partner violence:    Fear of current or ex partner: Not on file    Emotionally abused: Not on file    Physically abused: Not on file    Forced sexual activity: Not on file  Other Topics Concern  . Not on file  Social History Narrative   Lives with husband in [place] in a one story home with a basement.  Has 4 children.  Works as a Quarry manager.  Education: CNA school.      Vitals:   02/21/18 0817  BP: 102/62  Pulse: 84  SpO2: 96%  Weight: 130 lb (59 kg)  Height: 5' (1.524 m)    Wt Readings from Last 3 Encounters:  02/21/18 130 lb (59 kg)  01/01/18 123 lb 7.3 oz (56 kg)  12/21/17 123 lb 8 oz (56 kg)     PHYSICAL EXAM General: NAD HEENT: Normal. Neck: No JVD, no thyromegaly. Lungs: Clear to auscultation bilaterally with normal respiratory effort. CV: Regular rate and rhythm, normal S1/S2, no S3/S4, no murmur. No  pretibial or periankle edema.  No carotid bruit.   Abdomen: Soft, nontender, mild distention.  Neurologic: Alert and oriented.  Psych: Normal affect. Skin: Normal. Musculoskeletal: No gross deformities.    ECG: Reviewed above under Subjective   Labs: Lab Results  Component Value Date/Time   K 3.9 11/17/2017 11:45 AM   BUN 18 11/17/2017 11:45 AM   BUN 12 07/20/2016 01:27 PM   CREATININE 0.93 11/17/2017 11:45 AM   CREATININE 0.73 05/13/2016 10:20 AM   ALT 15 07/31/2017 09:14 PM   TSH 0.60 06/26/2017 11:28 AM   HGB 11.0 (L) 11/17/2017 11:45 AM     Lipids: Lab Results  Component Value Date/Time   LDLCALC 81 03/19/2017 06:43 AM   CHOL 179 03/19/2017 06:43 AM   TRIG 245 (H) 03/19/2017 06:43 AM   HDL 49 03/19/2017 06:43 AM       ASSESSMENT AND PLAN: 1.  Coronary artery disease: Symptomatically stable overall.  Continue aspirin, atorvastatin, Zetia, Toprol-XL 12.5 mg daily, and Imdur 30 mg.  2.  Hypertension: Blood pressure is low normal with lower readings at home.  She is symptomatic from this.  I will stop Entresto and start losartan 12.5 mg daily.  3.  Hyperlipidemia: LDL 81 in November 2018.  Continue Lipitor 80 mg and Zetia.  4.  Cardiomyopathy/chronic combined systolic and diastolic heart failure: Currently taking Toprol-XL 12.5 mg daily and Entresto.  Weight is stable at home and ranges from 125-127 pounds but is up 7 pounds by our scales.  She has had increased symptoms over the past 2 months.  LVEF has improved to 40 to 45%.  I will stop Entresto and start losartan 12.5 mg daily.  I will increase Lasix to 40 mg daily and potassium to 40 mEq daily for 4 days and then go back to 20 mg of Lasix daily and 20 mEq of potassium daily.  If she feels better on the higher dose of Lasix moving forward I may decide to increase her daily dose to 40 mg.    Disposition: Follow up 2-3 months   Kate Sable, M.D., F.A.C.C.

## 2018-02-22 ENCOUNTER — Other Ambulatory Visit: Payer: Self-pay | Admitting: Cardiovascular Disease

## 2018-02-27 ENCOUNTER — Encounter: Payer: Self-pay | Admitting: *Deleted

## 2018-02-27 ENCOUNTER — Encounter: Payer: Self-pay | Admitting: Gastroenterology

## 2018-02-27 ENCOUNTER — Other Ambulatory Visit: Payer: Self-pay | Admitting: *Deleted

## 2018-02-27 ENCOUNTER — Ambulatory Visit (INDEPENDENT_AMBULATORY_CARE_PROVIDER_SITE_OTHER): Payer: Managed Care, Other (non HMO) | Admitting: Gastroenterology

## 2018-02-27 VITALS — BP 108/73 | HR 88 | Temp 97.1°F | Ht 60.0 in | Wt 125.6 lb

## 2018-02-27 DIAGNOSIS — Z8619 Personal history of other infectious and parasitic diseases: Secondary | ICD-10-CM | POA: Insufficient documentation

## 2018-02-27 DIAGNOSIS — Z1211 Encounter for screening for malignant neoplasm of colon: Secondary | ICD-10-CM | POA: Diagnosis not present

## 2018-02-27 DIAGNOSIS — R1310 Dysphagia, unspecified: Secondary | ICD-10-CM

## 2018-02-27 MED ORDER — NA SULFATE-K SULFATE-MG SULF 17.5-3.13-1.6 GM/177ML PO SOLN: 1.0000 | ORAL | 0 refills | Status: DC

## 2018-02-27 NOTE — Progress Notes (Signed)
Referring Provider: [name], MD Primary Care Physician:  [name], MD Primary GI: Dr. Gala Romney   Chief Complaint  Patient presents with  . Bloated  . Gastroesophageal Reflux    HPI:   [name] is a 57 y.o. female presenting today originally seen in Aug 2019 to evaluate for screening colonoscopy. She had just finished antibiotics at that time and had postprandial frequent stools, as well as exposure to patients at her job with [employer]. Stool assay positive for toxin and GDH, prescribed Vanc 125 mg oral QID for 14 days.    Feels like food and liquids are getting hung up mid esophagus. Symptoms worsening, first noticed it 2 years ago. Significant discomfort. Has to stop eating and wait a few minutes before starting to eat again. Some mornings wakes up nauseated. No vomiting. Feels full/bloated. No prior EGD or colonoscopy. Not taking omeprazole as she does not have typical GERD or indigestion. Used to have a good appetite but now with early satiety. No NSAIDs, aspirin powders. Just takes tylenol prn.   Past Medical History:  Diagnosis Date  . Anxiety state 03/25/2016  . Basal cell carcinoma of forehead   . CHF (congestive heart failure) ([place])   . Coronary artery disease    a. 03/11/16 PCI with DES-->Prox/Mid Cx;  b. 03/14/16 PCI with DES x2-->RCA, EF 30-35%.  . Essential hypertension   . GERD (gastroesophageal reflux disease)   . HFrEF (heart failure with reduced ejection fraction) ([place])    a. 10/2016 Echo: EF 35-40%, Gr1 DD, mild focal basal septal hypertrophy, basal inflat, mid inflat, basal antlat AK. Mid infept/inf/antlat, apical lateral sev HK. Mod MR. mildly reduced RV fxn. Mild TR.  [name] Kitchen History of pneumonia   . Hyperlipidemia   . Ischemic cardiomyopathy    a. 10/2016 Echo: EF 35-40%, Gr1 DD.  [name] Kitchen Mitral regurgitation   . NSTEMI (non-ST elevated myocardial infarction) ([place]) 03/10/2016  . Squamous cell cancer of skin of nose   . Thrombocytosis ([place]) 03/26/2016  . Tobacco abuse    . Trichimoniasis   . Wears dentures   . Wears glasses     Past Surgical History:  Procedure Laterality Date  . APPENDECTOMY    . CARDIAC CATHETERIZATION N/A 03/11/2016   Procedure: Left Heart Cath and Coronary Angiography;  Surgeon: Leonie Man, MD;  Location: [facility];  Service: Cardiovascular;  Laterality: N/A;  . CARDIAC CATHETERIZATION N/A 03/11/2016   Procedure: Coronary Stent Intervention;  Surgeon: Leonie Man, MD;  Location: [facility];  Service: Cardiovascular;  Laterality: N/A;  . CARDIAC CATHETERIZATION N/A 03/14/2016   Procedure: Coronary Stent Intervention;  Surgeon: Peter M Martinique, MD;  Location: [facility];  Service: Cardiovascular;  Laterality: N/A;  . CHOLECYSTECTOMY OPEN  1984  . CORONARY ANGIOPLASTY WITH STENT PLACEMENT  03/14/2016  . FINGER ARTHROPLASTY Left 05/14/2013   Procedure: LEFT THUMB CARPAL METACARPAL ARTHROPLASTY;  Surgeon: Tennis Must, MD;  Location: [facility];  Service: Orthopedics;  Laterality: Left;  . IR ANGIO INTRA EXTRACRAN SEL COM CAROTID INNOMINATE BILAT MOD SED  01/05/2017  . IR ANGIO VERTEBRAL SEL VERTEBRAL BILAT MOD SED  01/05/2017  . IR RADIOLOGIST EVAL & MGMT  12/30/2016  . TUBAL LIGATION  1987  . VAGINAL HYSTERECTOMY  2009    Current Outpatient Medications  Medication Sig Dispense Refill  . ALPRAZolam (XANAX) 1 MG tablet Take 0.5-1 mg by mouth at bedtime.     [name] Kitchen aspirin EC  81 MG tablet Take 81 mg by mouth every evening.     [name] Kitchen atorvastatin (LIPITOR) 80 MG tablet Take 1 tablet (80 mg total) by mouth daily. 90 tablet 1  . calcium carbonate (TUMS - DOSED IN MG ELEMENTAL CALCIUM) 500 MG chewable tablet Chew 500 mg by mouth 2 (two) times daily as needed for indigestion or heartburn.     . ezetimibe (ZETIA) 10 MG tablet Take 1 tablet (10 mg total) by mouth daily. 30 tablet 11  . furosemide (LASIX) 20 MG tablet Take 1 tablet (20 mg total) by mouth daily. 30 tablet 0  . isosorbide mononitrate (IMDUR) 30  MG 24 hr tablet Take 30 mg by mouth daily.    [name] Kitchen KLOR-CON M20 20 MEQ tablet TAKE 1 TABLET BY MOUTH ONCE DAILY 90 tablet 3  . LORazepam (ATIVAN) 1 MG tablet Take 1.5 mg by mouth daily.  1  . losartan (COZAAR) 25 MG tablet Take 0.5 tablets (12.5 mg total) by mouth daily. 45 tablet 3  . metoprolol succinate (TOPROL XL) 25 MG 24 hr tablet Take 0.5 tablets (12.5 mg total) by mouth daily. 30 tablet 0  . rOPINIRole (REQUIP) 0.5 MG tablet Take 1 tablet by mouth at bedtime.  0   No current facility-administered medications for this visit.     Allergies as of 02/27/2018 - Review Complete 02/27/2018  Allergen Reaction Noted  . Tape Other (See Comments) 01/03/2017    Family History  Problem Relation Age of Onset  . Diabetes Father   . Hypertension Father   . CAD Father   . Colon polyps Father 60       pre-cancerous   . Stroke Mother   . Hypertension Mother   . Diabetes Mother   . Heart failure Other   . Breast cancer Maternal Grandmother   . Colon cancer Neg Hx     Social History   Socioeconomic History  . Marital status: Married    Spouse name: Not on file  . Number of children: Not on file  . Years of education: Not on file  . Highest education level: Not on file  Occupational History  . Occupation: CNA  Social Needs  . Financial resource strain: Not on file  . Food insecurity:    Worry: Not on file    Inability: Not on file  . Transportation needs:    Medical: Not on file    Non-medical: Not on file  Tobacco Use  . Smoking status: Current Some Day Smoker    Packs/day: 0.10    Years: 15.00    Pack years: 1.50    Types: Cigarettes  . Smokeless tobacco: Never Used  . Tobacco comment: smokes a cigarette occasionally  Substance and Sexual Activity  . Alcohol use: Yes    Alcohol/week: 6.0 standard drinks    Types: 6 Cans of beer per week    Comment: three times a week, will have glass of wine and beer   . Drug use: Not Currently    Types: Marijuana    Comment: former    . Sexual activity: Not Currently    Birth control/protection: Surgical    Comment: hyst  Lifestyle  . Physical activity:    Days per week: Not on file    Minutes per session: Not on file  . Stress: Not on file  Relationships  . Social connections:    Talks on phone: Not on file    Gets together: Not on file    Attends  religious service: Not on file    Active member of club or organization: Not on file    Attends meetings of clubs or organizations: Not on file    Relationship status: Not on file  Other Topics Concern  . Not on file  Social History Narrative   Lives with husband in [place] in a one story home with a basement.  Has 4 children.  Works as a Quarry manager.  Education: CNA school.     Review of Systems: Gen: Denies fever, chills, anorexia. Denies fatigue, weakness, weight loss.  CV: Denies chest pain, palpitations, syncope, peripheral edema, and claudication. Resp: Denies dyspnea at rest, cough, wheezing, coughing up blood, and pleurisy. GI: see HPI  Derm: Denies rash, itching, dry skin Psych: Denies depression, anxiety, memory loss, confusion. No homicidal or suicidal ideation.  Heme: Denies bruising, bleeding, and enlarged lymph nodes.  Physical Exam: BP 108/73   Pulse 88   Temp (!) 97.1 F (36.2 C) (Oral)   Ht 5' (1.524 m)   Wt 125 lb 9.6 oz (57 kg)   BMI 24.53 kg/m  General:   Alert and oriented. No distress noted. Pleasant and cooperative.  Head:  Normocephalic and atraumatic. Eyes:  Conjuctiva clear without scleral icterus. Mouth:  Oral mucosa pink and moist.  Lungs: clear to auscultation bilaterally  Cardiac: S1 S2 present without murmurs  Abdomen:  +BS, soft, non-tender and non-distended. No rebound or guarding. No HSM or masses noted. Msk:  Symmetrical without gross deformities. Normal posture. Extremities:  Without edema. Neurologic:  Alert and  oriented x4 Psych:  Alert and cooperative. Normal mood and affect.

## 2018-02-27 NOTE — Patient Instructions (Signed)
[name]  02/27/2018     @PREFPERIOPPHARMACY @   Your procedure is scheduled on  03/08/2018   Report to [facility] at  700  A.M.  Call this number if you have problems the morning of surgery:  [phone]   Remember:  Follow the diet and prep instructions given to you by Dr Nona Dell office.                       Take these medicines the morning of surgery with A SIP OF WATER  Isosorbide, ativan (if needed), losartan, metoprolol.    Do not wear jewelry, make-up or nail polish.  Do not wear lotions, powders, or perfumes, or deodorant.  Do not shave 48 hours prior to surgery.  Men may shave face and neck.  Do not bring valuables to the hospital.  [facility] is not responsible for any belongings or valuables.  Contacts, dentures or bridgework may not be worn into surgery.  Leave your suitcase in the car.  After surgery it may be brought to your room.  For patients admitted to the hospital, discharge time will be determined by your treatment team.  Patients discharged the day of surgery will not be allowed to drive home.   Name and phone number of your driver:   family Special instructions:  None  Please read over the following fact sheets that you were given. Anesthesia Post-op Instructions and Care and Recovery After Surgery       Colonoscopy, Adult A colonoscopy is an exam to look at the large intestine. It is done to check for problems, such as:  Lumps (tumors).  Growths (polyps).  Swelling (inflammation).  Bleeding.  What happens before the procedure? Eating and drinking Follow instructions from your doctor about eating and drinking. These instructions may include:  A few days before the procedure - follow a low-fiber diet. ? Avoid nuts. ? Avoid seeds. ? Avoid dried fruit. ? Avoid raw fruits. ? Avoid vegetables.  1-3 days before the procedure - follow a clear liquid diet. Avoid liquids that have red or purple dye. Drink only clear  liquids, such as: ? Clear broth or bouillon. ? Black coffee or tea. ? Clear juice. ? Clear soft drinks or sports drinks. ? Gelatin dessert. ? Popsicles.  On the day of the procedure - do not eat or drink anything during the 2 hours before the procedure.  Bowel prep If you were prescribed an oral bowel prep:  Take it as told by your doctor. Starting the day before your procedure, you will need to drink a lot of liquid. The liquid will cause you to poop (have bowel movements) until your poop is almost clear or light green.  If your skin or butt gets irritated from diarrhea, you may: ? Wipe the area with wipes that have medicine in them, such as adult wet wipes with aloe and vitamin E. ? Put something on your skin that soothes the area, such as petroleum jelly.  If you throw up (vomit) while drinking the bowel prep, take a break for up to 60 minutes. Then begin the bowel prep again. If you keep throwing up and you cannot take the bowel prep without throwing up, call your doctor.  General instructions  Ask your doctor about changing or stopping your normal medicines. This is important if you take diabetes medicines or blood thinners.  Plan to have someone take  you home from the hospital or clinic. What happens during the procedure?  An IV tube may be put into one of your veins.  You will be given medicine to help you relax (sedative).  To reduce your risk of infection: ? Your doctors will wash their hands. ? Your anal area will be washed with soap.  You will be asked to lie on your side with your knees bent.  Your doctor will get a long, thin, flexible tube ready. The tube will have a camera and a light on the end.  The tube will be put into your anus.  The tube will be gently put into your large intestine.  Air will be delivered into your large intestine to keep it open. You may feel some pressure or cramping.  The camera will be used to take photos.  A small tissue  sample may be removed from your body to be looked at under a microscope (biopsy). If any possible problems are found, the tissue will be sent to a lab for testing.  If small growths are found, your doctor may remove them and have them checked for cancer.  The tube that was put into your anus will be slowly removed. The procedure may vary among doctors and hospitals. What happens after the procedure?  Your doctor will check on you often until the medicines you were given have worn off.  Do not drive for 24 hours after the procedure.  You may have a small amount of blood in your poop.  You may pass gas.  You may have mild cramps or bloating in your belly (abdomen).  It is up to you to get the results of your procedure. Ask your doctor, or the department performing the procedure, when your results will be ready. This information is not intended to replace advice given to you by your health care provider. Make sure you discuss any questions you have with your health care provider. Document Released: 05/28/2010 Document Revised: 02/24/2016 Document Reviewed: 07/07/2015 Elsevier Interactive Patient Education  2017 [employer]  Colonoscopy, Adult, Care After This sheet gives you information about how to care for yourself after your procedure. Your health care provider may also give you more specific instructions. If you have problems or questions, contact your health care provider. What can I expect after the procedure? After the procedure, it is common to have:  A small amount of blood in your stool for 24 hours after the procedure.  Some gas.  Mild abdominal cramping or bloating.  Follow these instructions at home: General instructions   For the first 24 hours after the procedure: ? Do not drive or use machinery. ? Do not sign important documents. ? Do not drink alcohol. ? Do your regular daily activities at a slower pace than normal. ? Eat soft, easy-to-digest foods. ? Rest  often.  Take over-the-counter or prescription medicines only as told by your health care provider.  It is up to you to get the results of your procedure. Ask your health care provider, or the department performing the procedure, when your results will be ready. Relieving cramping and bloating  Try walking around when you have cramps or feel bloated.  Apply heat to your abdomen as told by your health care provider. Use a heat source that your health care provider recommends, such as a moist heat pack or a heating pad. ? Place a towel between your skin and the heat source. ? Leave the heat on for 20-30  minutes. ? Remove the heat if your skin turns bright red. This is especially important if you are unable to feel pain, heat, or cold. You may have a greater risk of getting burned. Eating and drinking  Drink enough fluid to keep your urine clear or pale yellow.  Resume your normal diet as instructed by your health care provider. Avoid heavy or fried foods that are hard to digest.  Avoid drinking alcohol for as long as instructed by your health care provider. Contact a health care provider if:  You have blood in your stool 2-3 days after the procedure. Get help right away if:  You have more than a small spotting of blood in your stool.  You pass large blood clots in your stool.  Your abdomen is swollen.  You have nausea or vomiting.  You have a fever.  You have increasing abdominal pain that is not relieved with medicine. This information is not intended to replace advice given to you by your health care provider. Make sure you discuss any questions you have with your health care provider. Document Released: 12/08/2003 Document Revised: 01/18/2016 Document Reviewed: 07/07/2015 Elsevier Interactive Patient Education  2018 [place] Anesthesia is a term that refers to techniques, procedures, and medicines that help a person stay safe and comfortable  during a medical procedure. Monitored anesthesia care, or sedation, is one type of anesthesia. Your anesthesia specialist may recommend sedation if you will be having a procedure that does not require you to be unconscious, such as:  Cataract surgery.  A dental procedure.  A biopsy.  A colonoscopy.  During the procedure, you may receive a medicine to help you relax (sedative). There are three levels of sedation:  Mild sedation. At this level, you may feel awake and relaxed. You will be able to follow directions.  Moderate sedation. At this level, you will be sleepy. You may not remember the procedure.  Deep sedation. At this level, you will be asleep. You will not remember the procedure.  The more medicine you are given, the deeper your level of sedation will be. Depending on how you respond to the procedure, the anesthesia specialist may change your level of sedation or the type of anesthesia to fit your needs. An anesthesia specialist will monitor you closely during the procedure. Let your health care provider know about:  Any allergies you have.  All medicines you are taking, including vitamins, herbs, eye drops, creams, and over-the-counter medicines.  Any use of steroids (by mouth or as a cream).  Any problems you or family members have had with sedatives and anesthetic medicines.  Any blood disorders you have.  Any surgeries you have had.  Any medical conditions you have, such as sleep apnea.  Whether you are pregnant or may be pregnant.  Any use of cigarettes, alcohol, or street drugs. What are the risks? Generally, this is a safe procedure. However, problems may occur, including:  Getting too much medicine (oversedation).  Nausea.  Allergic reaction to medicines.  Trouble breathing. If this happens, a breathing tube may be used to help with breathing. It will be removed when you are awake and breathing on your own.  Heart trouble.  Lung trouble.  Before  the procedure Staying hydrated Follow instructions from your health care provider about hydration, which may include:  Up to 2 hours before the procedure - you may continue to drink clear liquids, such as water, clear fruit juice, black coffee, and plain  tea.  Eating and drinking restrictions Follow instructions from your health care provider about eating and drinking, which may include:  8 hours before the procedure - stop eating heavy meals or foods such as meat, fried foods, or fatty foods.  6 hours before the procedure - stop eating light meals or foods, such as toast or cereal.  6 hours before the procedure - stop drinking milk or drinks that contain milk.  2 hours before the procedure - stop drinking clear liquids.  Medicines Ask your health care provider about:  Changing or stopping your regular medicines. This is especially important if you are taking diabetes medicines or blood thinners.  Taking medicines such as aspirin and ibuprofen. These medicines can thin your blood. Do not take these medicines before your procedure if your health care provider instructs you not to.  Tests and exams  You will have a physical exam.  You may have blood tests done to show: ? How well your kidneys and liver are working. ? How well your blood can clot.  General instructions  Plan to have someone take you home from the hospital or clinic.  If you will be going home right after the procedure, plan to have someone with you for 24 hours.  What happens during the procedure?  Your blood pressure, heart rate, breathing, level of pain and overall condition will be monitored.  An IV tube will be inserted into one of your veins.  Your anesthesia specialist will give you medicines as needed to keep you comfortable during the procedure. This may mean changing the level of sedation.  The procedure will be performed. After the procedure  Your blood pressure, heart rate, breathing rate, and  blood oxygen level will be monitored until the medicines you were given have worn off.  Do not drive for 24 hours if you received a sedative.  You may: ? Feel sleepy, clumsy, or nauseous. ? Feel forgetful about what happened after the procedure. ? Have a sore throat if you had a breathing tube during the procedure. ? Vomit. This information is not intended to replace advice given to you by your health care provider. Make sure you discuss any questions you have with your health care provider. Document Released: 01/19/2005 Document Revised: 10/02/2015 Document Reviewed: 08/16/2015 Elsevier Interactive Patient Education  2018 [place], Care After These instructions provide you with information about caring for yourself after your procedure. Your health care provider may also give you more specific instructions. Your treatment has been planned according to current medical practices, but problems sometimes occur. Call your health care provider if you have any problems or questions after your procedure. What can I expect after the procedure? After your procedure, it is common to:  Feel sleepy for several hours.  Feel clumsy and have poor balance for several hours.  Feel forgetful about what happened after the procedure.  Have poor judgment for several hours.  Feel nauseous or vomit.  Have a sore throat if you had a breathing tube during the procedure.  Follow these instructions at home: For at least 24 hours after the procedure:   Do not: ? Participate in activities in which you could fall or become injured. ? Drive. ? Use heavy machinery. ? Drink alcohol. ? Take sleeping pills or medicines that cause drowsiness. ? Make important decisions or sign legal documents. ? Take care of children on your own.  Rest. Eating and drinking  Follow the diet that is recommended  by your health care provider.  If you vomit, drink water, juice, or soup when you  can drink without vomiting.  Make sure you have little or no nausea before eating solid foods. General instructions  Have a responsible adult stay with you until you are awake and alert.  Take over-the-counter and prescription medicines only as told by your health care provider.  If you smoke, do not smoke without supervision.  Keep all follow-up visits as told by your health care provider. This is important. Contact a health care provider if:  You keep feeling nauseous or you keep vomiting.  You feel light-headed.  You develop a rash.  You have a fever. Get help right away if:  You have trouble breathing. This information is not intended to replace advice given to you by your health care provider. Make sure you discuss any questions you have with your health care provider. Document Released: 08/16/2015 Document Revised: 12/16/2015 Document Reviewed: 08/16/2015 Elsevier Interactive Patient Education  [employer].

## 2018-02-27 NOTE — Patient Instructions (Signed)
I am glad you are doing better!   We are pursuing a routine screening colonoscopy and upper endoscopy with dilation by Dr. Gala Romney in the near future.  Further recommendations to follow!  I enjoyed seeing you again today! As you know, I value our relationship and want to provide genuine, compassionate, and quality care. I welcome your feedback. If you receive a survey regarding your visit,  I greatly appreciate you taking time to fill this out. See you next time!  Annitta Needs, PhD, ANP-BC [facility]

## 2018-03-01 ENCOUNTER — Other Ambulatory Visit: Payer: Self-pay | Admitting: Cardiovascular Disease

## 2018-03-04 ENCOUNTER — Emergency Department (HOSPITAL_COMMUNITY): Payer: Managed Care, Other (non HMO)

## 2018-03-04 ENCOUNTER — Inpatient Hospital Stay (HOSPITAL_COMMUNITY)
Admission: EM | Admit: 2018-03-04 | Discharge: 2018-03-05 | DRG: 313 | Disposition: A | Discharge status: Home / Self Care | Payer: Managed Care, Other (non HMO) | Attending: Internal Medicine | Admitting: Internal Medicine

## 2018-03-04 ENCOUNTER — Encounter (HOSPITAL_COMMUNITY): Payer: Self-pay | Admitting: Emergency Medicine

## 2018-03-04 ENCOUNTER — Other Ambulatory Visit: Payer: Self-pay

## 2018-03-04 DIAGNOSIS — Z833 Family history of diabetes mellitus: Secondary | ICD-10-CM

## 2018-03-04 DIAGNOSIS — R072 Precordial pain: Secondary | ICD-10-CM

## 2018-03-04 DIAGNOSIS — Z85828 Personal history of other malignant neoplasm of skin: Secondary | ICD-10-CM | POA: Diagnosis not present

## 2018-03-04 DIAGNOSIS — R0602 Shortness of breath: Secondary | ICD-10-CM | POA: Diagnosis present

## 2018-03-04 DIAGNOSIS — I5042 Chronic combined systolic (congestive) and diastolic (congestive) heart failure: Secondary | ICD-10-CM | POA: Diagnosis present

## 2018-03-04 DIAGNOSIS — I255 Ischemic cardiomyopathy: Secondary | ICD-10-CM | POA: Diagnosis present

## 2018-03-04 DIAGNOSIS — Z823 Family history of stroke: Secondary | ICD-10-CM | POA: Diagnosis not present

## 2018-03-04 DIAGNOSIS — F17210 Nicotine dependence, cigarettes, uncomplicated: Secondary | ICD-10-CM | POA: Diagnosis present

## 2018-03-04 DIAGNOSIS — Z955 Presence of coronary angioplasty implant and graft: Secondary | ICD-10-CM | POA: Diagnosis not present

## 2018-03-04 DIAGNOSIS — I2510 Atherosclerotic heart disease of native coronary artery without angina pectoris: Secondary | ICD-10-CM | POA: Diagnosis present

## 2018-03-04 DIAGNOSIS — F411 Generalized anxiety disorder: Secondary | ICD-10-CM | POA: Diagnosis present

## 2018-03-04 DIAGNOSIS — E785 Hyperlipidemia, unspecified: Secondary | ICD-10-CM | POA: Diagnosis present

## 2018-03-04 DIAGNOSIS — R110 Nausea: Secondary | ICD-10-CM | POA: Diagnosis present

## 2018-03-04 DIAGNOSIS — Z8249 Family history of ischemic heart disease and other diseases of the circulatory system: Secondary | ICD-10-CM

## 2018-03-04 DIAGNOSIS — I252 Old myocardial infarction: Secondary | ICD-10-CM | POA: Diagnosis not present

## 2018-03-04 DIAGNOSIS — Z7982 Long term (current) use of aspirin: Secondary | ICD-10-CM | POA: Diagnosis not present

## 2018-03-04 DIAGNOSIS — I110 Hypertensive heart disease with heart failure: Secondary | ICD-10-CM | POA: Diagnosis present

## 2018-03-04 DIAGNOSIS — R0789 Other chest pain: Principal | ICD-10-CM | POA: Diagnosis present

## 2018-03-04 DIAGNOSIS — I10 Essential (primary) hypertension: Secondary | ICD-10-CM | POA: Diagnosis present

## 2018-03-04 DIAGNOSIS — R079 Chest pain, unspecified: Secondary | ICD-10-CM | POA: Diagnosis present

## 2018-03-04 LAB — CBC
HCT: 38.9 % (ref 36.0–46.0)
Hemoglobin: 12.3 g/dL (ref 12.0–15.0)
MCH: 27.6 pg (ref 26.0–34.0)
MCHC: 31.6 g/dL (ref 30.0–36.0)
MCV: 87.2 fL (ref 80.0–100.0)
Platelets: 411 10*3/uL — ABNORMAL HIGH (ref 150–400)
RBC: 4.46 MIL/uL (ref 3.87–5.11)
RDW: 15.4 % (ref 11.5–15.5)
WBC: 7.3 10*3/uL (ref 4.0–10.5)
nRBC: 0 % (ref 0.0–0.2)

## 2018-03-04 LAB — BASIC METABOLIC PANEL
Anion gap: 8 (ref 5–15)
BUN: 19 mg/dL (ref 6–20)
CO2: 25 mmol/L (ref 22–32)
Calcium: 9.8 mg/dL (ref 8.9–10.3)
Chloride: 103 mmol/L (ref 98–111)
Creatinine, Ser: 0.84 mg/dL (ref 0.44–1.00)
GFR calc Af Amer: >60 mL/min (ref >60)
GFR calc non Af Amer: >60 mL/min (ref >60)
Glucose, Bld: 99 mg/dL (ref 70–99)
Potassium: 3.5 mmol/L (ref 3.5–5.1)
Sodium: 136 mmol/L (ref 135–145)

## 2018-03-04 LAB — TROPONIN I
Troponin I: <0.03 ng/mL (ref <0.03)
Troponin I: <0.03 ng/mL (ref <0.03)

## 2018-03-04 LAB — BRAIN NATRIURETIC PEPTIDE: B Natriuretic Peptide: 179 pg/mL — ABNORMAL HIGH (ref 0.0–100.0)

## 2018-03-04 LAB — MRSA PCR SCREENING: MRSA by PCR: NEGATIVE

## 2018-03-04 LAB — I-STAT BETA HCG BLOOD, ED (MC, WL, AP ONLY): I-stat hCG, quantitative: <5 m[IU]/mL (ref <5)

## 2018-03-04 LAB — HEPARIN LEVEL (UNFRACTIONATED): Heparin Unfractionated: 0.35 IU/mL (ref 0.30–0.70)

## 2018-03-04 LAB — I-STAT TROPONIN, ED: Troponin i, poc: 0.01 ng/mL (ref 0.00–0.08)

## 2018-03-04 MED ORDER — ASPIRIN 81 MG PO CHEW
324.0000 mg | CHEWABLE_TABLET | Freq: Once | ORAL | Status: AC
  Administered 2018-03-04: 324 mg via ORAL
  Filled 2018-03-04: qty 4

## 2018-03-04 MED ORDER — NITROGLYCERIN 2 % TD OINT: 1.0000 [in_us] | TOPICAL_OINTMENT | Freq: Four times a day (QID) | TRANSDERMAL | Status: DC | PRN

## 2018-03-04 MED ORDER — ASPIRIN EC 81 MG PO TBEC
81.0000 mg | DELAYED_RELEASE_TABLET | Freq: Every evening | ORAL | Status: DC
  Administered 2018-03-05: 81 mg via ORAL
  Filled 2018-03-04: qty 1

## 2018-03-04 MED ORDER — SODIUM CHLORIDE 0.9% FLUSH: 3.0000 mL | INTRAVENOUS | Status: DC | PRN

## 2018-03-04 MED ORDER — NITROGLYCERIN 2 % TD OINT
1.0000 [in_us] | TOPICAL_OINTMENT | Freq: Four times a day (QID) | TRANSDERMAL | Status: DC
  Administered 2018-03-04: 1 [in_us] via TOPICAL
  Filled 2018-03-04: qty 1

## 2018-03-04 MED ORDER — ALPRAZOLAM 0.5 MG PO TABS: 0.5000 mg | ORAL_TABLET | Freq: Every evening | ORAL | Status: DC | PRN

## 2018-03-04 MED ORDER — ROPINIROLE HCL 1 MG PO TABS
0.5000 mg | ORAL_TABLET | Freq: Every day | ORAL | Status: DC
  Administered 2018-03-04: 0.5 mg via ORAL
  Filled 2018-03-04: qty 1
  Filled 2018-03-04 (×2): qty 0.5

## 2018-03-04 MED ORDER — NITROGLYCERIN 0.4 MG SL SUBL
0.4000 mg | SUBLINGUAL_TABLET | SUBLINGUAL | Status: DC | PRN
  Administered 2018-03-04 (×3): 0.4 mg via SUBLINGUAL
  Filled 2018-03-04: qty 1

## 2018-03-04 MED ORDER — METOPROLOL SUCCINATE ER 25 MG PO TB24
12.5000 mg | ORAL_TABLET | Freq: Every day | ORAL | Status: DC
  Administered 2018-03-04 – 2018-03-05 (×2): 12.5 mg via ORAL
  Filled 2018-03-04 (×2): qty 1

## 2018-03-04 MED ORDER — ONDANSETRON HCL 4 MG/2ML IJ SOLN
4.0000 mg | Freq: Once | INTRAMUSCULAR | Status: AC | PRN
  Administered 2018-03-04: 4 mg via INTRAVENOUS
  Filled 2018-03-04: qty 2

## 2018-03-04 MED ORDER — SODIUM CHLORIDE 0.9% FLUSH
3.0000 mL | Freq: Two times a day (BID) | INTRAVENOUS | Status: DC
  Administered 2018-03-05: 3 mL via INTRAVENOUS

## 2018-03-04 MED ORDER — ATORVASTATIN CALCIUM 40 MG PO TABS
80.0000 mg | ORAL_TABLET | Freq: Every day | ORAL | Status: DC
  Administered 2018-03-04 – 2018-03-05 (×2): 80 mg via ORAL
  Filled 2018-03-04 (×2): qty 2

## 2018-03-04 MED ORDER — ACETAMINOPHEN 325 MG PO TABS
650.0000 mg | ORAL_TABLET | Freq: Four times a day (QID) | ORAL | Status: DC | PRN
  Administered 2018-03-05: 650 mg via ORAL
  Filled 2018-03-04: qty 2

## 2018-03-04 MED ORDER — ISOSORBIDE MONONITRATE ER 30 MG PO TB24
30.0000 mg | ORAL_TABLET | Freq: Every day | ORAL | Status: DC
  Administered 2018-03-04 – 2018-03-05 (×2): 30 mg via ORAL
  Filled 2018-03-04 (×2): qty 1

## 2018-03-04 MED ORDER — ONDANSETRON HCL 4 MG/2ML IJ SOLN: 4.0000 mg | Freq: Four times a day (QID) | INTRAMUSCULAR | Status: DC | PRN

## 2018-03-04 MED ORDER — HEPARIN BOLUS VIA INFUSION
3300.0000 [IU] | Freq: Once | INTRAVENOUS | Status: AC
  Administered 2018-03-04: 3300 [IU] via INTRAVENOUS

## 2018-03-04 MED ORDER — POTASSIUM CHLORIDE CRYS ER 20 MEQ PO TBCR
20.0000 meq | EXTENDED_RELEASE_TABLET | Freq: Every day | ORAL | Status: DC
  Administered 2018-03-04 – 2018-03-05 (×2): 20 meq via ORAL
  Filled 2018-03-04 (×2): qty 1

## 2018-03-04 MED ORDER — ACETAMINOPHEN 650 MG RE SUPP: 650.0000 mg | Freq: Four times a day (QID) | RECTAL | Status: DC | PRN

## 2018-03-04 MED ORDER — NICOTINE 21 MG/24HR TD PT24
21.0000 mg | MEDICATED_PATCH | Freq: Every day | TRANSDERMAL | Status: DC
  Administered 2018-03-05: 21 mg via TRANSDERMAL
  Filled 2018-03-04: qty 1

## 2018-03-04 MED ORDER — ONDANSETRON HCL 4 MG PO TABS: 4.0000 mg | ORAL_TABLET | Freq: Four times a day (QID) | ORAL | Status: DC | PRN

## 2018-03-04 MED ORDER — EZETIMIBE 10 MG PO TABS
10.0000 mg | ORAL_TABLET | Freq: Every day | ORAL | Status: DC
  Administered 2018-03-04 – 2018-03-05 (×2): 10 mg via ORAL
  Filled 2018-03-04 (×2): qty 1

## 2018-03-04 MED ORDER — HEPARIN (PORCINE) IN NACL 100-0.45 UNIT/ML-% IJ SOLN
700.0000 [IU]/h | INTRAMUSCULAR | Status: DC
  Administered 2018-03-04: 600 [IU]/h via INTRAVENOUS
  Administered 2018-03-05: 700 [IU]/h via INTRAVENOUS
  Filled 2018-03-04: qty 250

## 2018-03-04 MED ORDER — MORPHINE SULFATE (PF) 2 MG/ML IV SOLN
2.0000 mg | Freq: Once | INTRAVENOUS | Status: AC
  Administered 2018-03-04: 2 mg via INTRAVENOUS
  Filled 2018-03-04: qty 1

## 2018-03-04 MED ORDER — SODIUM CHLORIDE 0.9 % IV SOLN: 250.0000 mL | INTRAVENOUS | Status: DC | PRN

## 2018-03-04 MED ORDER — FUROSEMIDE 20 MG PO TABS
20.0000 mg | ORAL_TABLET | Freq: Every day | ORAL | Status: DC
  Administered 2018-03-04 – 2018-03-05 (×2): 20 mg via ORAL
  Filled 2018-03-04 (×2): qty 1

## 2018-03-04 MED ORDER — LORAZEPAM 0.5 MG PO TABS
0.5000 mg | ORAL_TABLET | Freq: Every day | ORAL | Status: DC
  Administered 2018-03-04 – 2018-03-05 (×2): 0.5 mg via ORAL
  Filled 2018-03-04 (×2): qty 1

## 2018-03-04 MED ORDER — LOSARTAN POTASSIUM 25 MG PO TABS
12.5000 mg | ORAL_TABLET | Freq: Every day | ORAL | Status: DC
  Administered 2018-03-04 – 2018-03-05 (×2): 12.5 mg via ORAL
  Filled 2018-03-04 (×2): qty 1

## 2018-03-04 NOTE — Progress Notes (Signed)
ANTICOAGULATION CONSULT NOTE - Initial Consult  Pharmacy Consult for heparin Indication: chest pain/ACS  Allergies  Allergen Reactions  . Tape Other (See Comments)    PEELS SKIN OFF  (PAPER TAPE IS FINE)    Patient Measurements: Weight: 123 lb 7.3 oz (56 kg) Heparin Dosing Weight: 56 kg  Vital Signs: Temp: 97.8 F (36.6 C) (10/27 4562) Temp Source: Oral (10/27 5638) BP: 120/78 (10/27 1030) Pulse Rate: 70 (10/27 1030)  Labs: Recent Labs    03/04/18 0644  HGB 12.3  HCT 38.9  PLT 411*  CREATININE 0.84    Estimated Creatinine Clearance: 59.4 mL/min (by C-G formula based on SCr of 0.84 mg/dL).   Medical History: Past Medical History:  Diagnosis Date  . Anxiety state 03/25/2016  . Basal cell carcinoma of forehead   . CHF (congestive heart failure) ([place])   . Coronary artery disease    a. 03/11/16 PCI with DES-->Prox/Mid Cx;  b. 03/14/16 PCI with DES x2-->RCA, EF 30-35%.  . Essential hypertension   . GERD (gastroesophageal reflux disease)   . HFrEF (heart failure with reduced ejection fraction) ([place])    a. 10/2016 Echo: EF 35-40%, Gr1 DD, mild focal basal septal hypertrophy, basal inflat, mid inflat, basal antlat AK. Mid infept/inf/antlat, apical lateral sev HK. Mod MR. mildly reduced RV fxn. Mild TR.  [name] Kitchen History of pneumonia   . Hyperlipidemia   . Ischemic cardiomyopathy    a. 10/2016 Echo: EF 35-40%, Gr1 DD.  [name] Kitchen Mitral regurgitation   . NSTEMI (non-ST elevated myocardial infarction) ([place]) 03/10/2016  . Squamous cell cancer of skin of nose   . Thrombocytosis ([place]) 03/26/2016  . Tobacco abuse   . Trichimoniasis   . Wears dentures   . Wears glasses     Medications:   (Not in a hospital admission)  Assessment: Pharmacy consulted to dose heparin in patient with chest pain/ACS.  Goal of Therapy:  Heparin level 0.3-0.7 units/ml Monitor platelets by anticoagulation protocol: Yes   Plan:  Give 3300 units bolus x 1 Start heparin infusion at 600 units/hr Check  anti-Xa level in 6-8 hours and daily while on heparin Continue to monitor H&H and platelets  Revonda Standard Amberley Hamler 03/04/2018,10:42 AM

## 2018-03-04 NOTE — ED Notes (Signed)
Hospitalist at bedside 

## 2018-03-04 NOTE — Assessment & Plan Note (Signed)
Completed course of vanc in Aug 2019. Back to baseline.

## 2018-03-04 NOTE — Progress Notes (Signed)
ANTICOAGULATION CONSULT NOTE -   Pharmacy Consult for heparin Indication: chest pain/ACS  Allergies  Allergen Reactions  . Tape Other (See Comments)    PEELS SKIN OFF  (PAPER TAPE IS FINE)    Patient Measurements: Height: 5' (152.4 cm) Weight: 126 lb 15.8 oz (57.6 kg) IBW/kg (Calculated) : 45.5 Heparin Dosing Weight: 56 kg  Vital Signs: Temp: 98.1 F (36.7 C) (10/27 1600) Temp Source: Oral (10/27 1600) BP: 88/57 (10/27 1700) Pulse Rate: 73 (10/27 1700)  Labs: Recent Labs    03/04/18 0644 03/04/18 1139 03/04/18 1706  HGB 12.3  --   --   HCT 38.9  --   --   PLT 411*  --   --   HEPARINUNFRC  --   --  0.35  CREATININE 0.84  --   --   TROPONINI  --  <0.03 <0.03    Estimated Creatinine Clearance: 60.1 mL/min (by C-G formula based on SCr of 0.84 mg/dL).   Medical History: Past Medical History:  Diagnosis Date  . Anxiety state 03/25/2016  . Basal cell carcinoma of forehead   . CHF (congestive heart failure) ([facility])   . Coronary artery disease    a. 03/11/16 PCI with DES-->Prox/Mid Cx;  b. 03/14/16 PCI with DES x2-->RCA, EF 30-35%.  . Essential hypertension   . GERD (gastroesophageal reflux disease)   . HFrEF (heart failure with reduced ejection fraction) ([facility])    a. 10/2016 Echo: EF 35-40%, Gr1 DD, mild focal basal septal hypertrophy, basal inflat, mid inflat, basal antlat AK. Mid infept/inf/antlat, apical lateral sev HK. Mod MR. mildly reduced RV fxn. Mild TR.  . History of pneumonia   . Hyperlipidemia   . Ischemic cardiomyopathy    a. 10/2016 Echo: EF 35-40%, Gr1 DD.  . Mitral regurgitation   . NSTEMI (non-ST elevated myocardial infarction) ([facility]) 03/10/2016  . Squamous cell cancer of skin of nose   . Thrombocytosis ([facility]) 03/26/2016  . Tobacco abuse   . Trichimoniasis   . Wears dentures   . Wears glasses     Medications:  Medications Prior to Admission  Medication Sig Dispense Refill Last Dose  . acetaminophen (TYLENOL) 325 MG tablet Take by mouth every 6  (six) hours as needed for mild pain or headache.   Past Week at Unknown time  . ALPRAZolam (XANAX) 1 MG tablet Take 0.5-1 mg by mouth at bedtime as needed for anxiety or sleep.    03/03/2018 at Unknown time  . aspirin EC 81 MG tablet Take 81 mg by mouth every evening.    03/03/2018 at Unknown time  . atorvastatin (LIPITOR) 80 MG tablet Take 1 tablet (80 mg total) by mouth daily. 90 tablet 1 03/03/2018 at Unknown time  . ezetimibe (ZETIA) 10 MG tablet Take 1 tablet (10 mg total) by mouth daily. 30 tablet 11 03/03/2018 at Unknown time  . furosemide (LASIX) 20 MG tablet Take 1 tablet (20 mg total) by mouth daily. 30 tablet 0 03/03/2018 at Unknown time  . isosorbide mononitrate (IMDUR) 30 MG 24 hr tablet Take 30 mg by mouth daily.   03/03/2018 at Unknown time  . KLOR-CON M20 20 MEQ tablet TAKE 1 TABLET BY MOUTH ONCE DAILY (Patient taking differently: Take 20 mEq by mouth daily. ) 90 tablet 3 03/03/2018 at Unknown time  . LORazepam (ATIVAN) 1 MG tablet Take 0.5 mg by mouth daily.   1 03/03/2018 at Unknown time  . losartan (COZAAR) 25 MG tablet Take 0.5 tablets (12.5 mg total) by mouth   daily. 45 tablet 3 03/03/2018 at Unknown time  . metoprolol succinate (TOPROL XL) 25 MG 24 hr tablet Take 0.5 tablets (12.5 mg total) by mouth daily. 30 tablet 0 03/03/2018 at 1800  . Na Sulfate-K Sulfate-Mg Sulf 17.5-3.13-1.6 GM/177ML SOLN Take 1 kit by mouth as directed. 1 Bottle 0 03/03/2018 at Unknown time  . nicotine (NICODERM CQ - DOSED IN MG/24 HOURS) 21 mg/24hr patch Place 21 mg onto the skin daily.   03/03/2018 at Unknown time  . rOPINIRole (REQUIP) 0.5 MG tablet Take 0.5 mg by mouth at bedtime.   0 03/03/2018 at Unknown time    Assessment: Pharmacy consulted to dose heparin in patient with chest pain/ACS.  Heparin leve 0.35  Goal of Therapy:  Heparin level 0.3-0.7 units/ml Monitor platelets by anticoagulation protocol: Yes   Plan:  Continue heparin infusion at 600 units/hr Check anti-Xa level daily while  on heparin. Continue to monitor H&H and platelets.   Steven C Hurth 03/04/2018,9:43 PM  

## 2018-03-04 NOTE — ED Triage Notes (Signed)
Pt C/O chest pain that began around 0300 this morning. Pt states the pain woke her up from sleep. Pt also reports nausea with no vomiting. Pt states that she is out of nitro at home.

## 2018-03-04 NOTE — ED Provider Notes (Signed)
[facility] Provider Note   CSN: [id number] Arrival date & time: 03/04/18  0768     History   Chief Complaint Chief Complaint  Patient presents with  . Chest Pain    HPI [name] is a 57 y.o. female.  Patient brought herself in.  Patient with acute onset of left-sided chest pain moving to the left shoulder at 3:00 this morning.  Patient with a history of coronary disease and CHF.  2 stents placed in 2017.  Followed by cardiology here in [place].  Patient is normally has nitroglycerin at home which she is out.  So patient has not had any aspirin or nitroglycerin.  Symptoms are associated with some nausea and some shortness of breath.     Past Medical History:  Diagnosis Date  . Anxiety state 03/25/2016  . Basal cell carcinoma of forehead   . CHF (congestive heart failure) ([place])   . Coronary artery disease    a. 03/11/16 PCI with DES-->Prox/Mid Cx;  b. 03/14/16 PCI with DES x2-->RCA, EF 30-35%.  . Essential hypertension   . GERD (gastroesophageal reflux disease)   . HFrEF (heart failure with reduced ejection fraction) ([place])    a. 10/2016 Echo: EF 35-40%, Gr1 DD, mild focal basal septal hypertrophy, basal inflat, mid inflat, basal antlat AK. Mid infept/inf/antlat, apical lateral sev HK. Mod MR. mildly reduced RV fxn. Mild TR.  [name] Kitchen History of pneumonia   . Hyperlipidemia   . Ischemic cardiomyopathy    a. 10/2016 Echo: EF 35-40%, Gr1 DD.  [name] Kitchen Mitral regurgitation   . NSTEMI (non-ST elevated myocardial infarction) ([place]) 03/10/2016  . Squamous cell cancer of skin of nose   . Thrombocytosis ([place]) 03/26/2016  . Tobacco abuse   . Trichimoniasis   . Wears dentures   . Wears glasses     Patient Active Problem List   Diagnosis Date Noted  . Dysphagia 02/27/2018  . Encounter for screening colonoscopy 02/27/2018  . History of Clostridium difficile infection 02/27/2018  . Frequent stools 12/20/2017  . Chronic combined systolic and diastolic CHF  (congestive heart failure) ([place]) 06/20/2016  . Hypokalemia   . Acute CHF (congestive heart failure) ([place]) 05/16/2016  . Acute on chronic systolic CHF (congestive heart failure) ([place]) 05/16/2016  . Acute respiratory failure with hypoxia ([place])   . Thrombocytosis ([place]) 03/26/2016  . Cardiomyopathy, ischemic 03/25/2016  . Acute on chronic combined systolic and diastolic congestive heart failure ([place]) 03/25/2016  . Anxiety state 03/25/2016  . Troponin level elevated 03/25/2016  . CAD (coronary artery disease), native coronary artery 03/25/2016  . Normocytic anemia 03/25/2016  . SOB (shortness of breath) 03/24/2016  . Lightheadedness 03/17/2016  . Hypotension 03/17/2016  . Tobacco abuse 03/12/2016  . NSTEMI (non-ST elevated myocardial infarction) ([place]) 03/11/2016  . Pain in the chest   . Essential hypertension 09/06/2015  . Mixed hyperlipidemia 09/06/2015  . GERD (gastroesophageal reflux disease) 09/06/2015  . Chest pain 09/06/2015    Past Surgical History:  Procedure Laterality Date  . APPENDECTOMY    . CARDIAC CATHETERIZATION N/A 03/11/2016   Procedure: Left Heart Cath and Coronary Angiography;  Surgeon: Leonie Man, MD;  Location: [facility];  Service: Cardiovascular;  Laterality: N/A;  . CARDIAC CATHETERIZATION N/A 03/11/2016   Procedure: Coronary Stent Intervention;  Surgeon: Leonie Man, MD;  Location: [facility];  Service: Cardiovascular;  Laterality: N/A;  . CARDIAC CATHETERIZATION N/A 03/14/2016   Procedure: Coronary Stent Intervention;  Surgeon: Peter M Martinique,  MD;  Location: [facility];  Service: Cardiovascular;  Laterality: N/A;  . CHOLECYSTECTOMY OPEN  1984  . CORONARY ANGIOPLASTY WITH STENT PLACEMENT  03/14/2016  . FINGER ARTHROPLASTY Left 05/14/2013   Procedure: LEFT THUMB CARPAL METACARPAL ARTHROPLASTY;  Surgeon: Tennis Must, MD;  Location: [facility];  Service: Orthopedics;  Laterality: Left;  . IR ANGIO INTRA EXTRACRAN SEL  COM CAROTID INNOMINATE BILAT MOD SED  01/05/2017  . IR ANGIO VERTEBRAL SEL VERTEBRAL BILAT MOD SED  01/05/2017  . IR RADIOLOGIST EVAL & MGMT  12/30/2016  . TUBAL LIGATION  1987  . VAGINAL HYSTERECTOMY  2009     OB History    Gravida  4   Para  4   Term  4   Preterm      AB      Living  4     SAB      TAB      Ectopic      Multiple      Live Births               Home Medications    Prior to Admission medications   Medication Sig Start Date End Date Taking? Authorizing Provider  acetaminophen (TYLENOL) 325 MG tablet Take by mouth every 6 (six) hours as needed for mild pain or headache.   Yes [provider]  ALPRAZolam Duanne Moron) 1 MG tablet Take 0.5-1 mg by mouth at bedtime as needed for anxiety or sleep.    Yes [provider]  aspirin EC 81 MG tablet Take 81 mg by mouth every evening.    Yes [provider]  atorvastatin (LIPITOR) 80 MG tablet Take 1 tablet (80 mg total) by mouth daily. 09/01/17  Yes Herminio Commons, MD  ezetimibe (ZETIA) 10 MG tablet Take 1 tablet (10 mg total) by mouth daily. 07/14/17  Yes Herminio Commons, MD  furosemide (LASIX) 20 MG tablet Take 1 tablet (20 mg total) by mouth daily. 11/17/17  Yes Sherwood Gambler, MD  isosorbide mononitrate (IMDUR) 30 MG 24 hr tablet Take 30 mg by mouth daily.   Yes [provider]  KLOR-CON M20 20 MEQ tablet TAKE 1 TABLET BY MOUTH ONCE DAILY Patient taking differently: Take 20 mEq by mouth daily.  04/11/17  Yes Herminio Commons, MD  LORazepam (ATIVAN) 1 MG tablet Take 0.5 mg by mouth daily.  11/09/17  Yes [provider]  losartan (COZAAR) 25 MG tablet Take 0.5 tablets (12.5 mg total) by mouth daily. 02/21/18 05/22/18 Yes Herminio Commons, MD  metoprolol succinate (TOPROL XL) 25 MG 24 hr tablet Take 0.5 tablets (12.5 mg total) by mouth daily. 11/17/17  Yes Sherwood Gambler, MD  Na Sulfate-K Sulfate-Mg Sulf 17.5-3.13-1.6 GM/177ML SOLN Take 1 kit by mouth as  directed. 02/27/18  Yes Rourk, Cristopher Estimable, MD  nicotine (NICODERM CQ - DOSED IN MG/24 HOURS) 21 mg/24hr patch Place 21 mg onto the skin daily.   Yes [provider]  rOPINIRole (REQUIP) 0.5 MG tablet Take 0.5 mg by mouth at bedtime.  11/13/17  Yes [provider]    Family History Family History  Problem Relation Age of Onset  . Diabetes Father   . Hypertension Father   . CAD Father   . Colon polyps Father 60       pre-cancerous   . Stroke Mother   . Hypertension Mother   . Diabetes Mother   . Heart failure Other   . Breast  cancer Maternal Grandmother   . Colon cancer Neg Hx     Social History Social History   Tobacco Use  . Smoking status: Current Some Day Smoker    Packs/day: 0.10    Years: 15.00    Pack years: 1.50    Types: Cigarettes  . Smokeless tobacco: Never Used  . Tobacco comment: smokes a cigarette occasionally  Substance Use Topics  . Alcohol use: Yes    Alcohol/week: 6.0 standard drinks    Types: 6 Cans of beer per week    Comment: three times a week, will have glass of wine and beer   . Drug use: Not Currently    Types: Marijuana    Comment: former     Allergies   Tape   Review of Systems Review of Systems  Constitutional: Negative for diaphoresis and fever.  HENT: Negative for congestion.   Eyes: Negative for visual disturbance.  Respiratory: Positive for shortness of breath.   Cardiovascular: Positive for chest pain. Negative for palpitations and leg swelling.  Gastrointestinal: Positive for nausea. Negative for abdominal pain.  Genitourinary: Negative for dysuria.  Musculoskeletal: Negative for back pain and neck pain.  Skin: Negative for rash.  Neurological: Negative for headaches.  Hematological: Does not bruise/bleed easily.  Psychiatric/Behavioral: Negative for confusion.     Physical Exam Updated Vital Signs BP 117/70   Pulse 81   Temp 98.1 F (36.7 C) (Oral)   Resp (!) 21   Ht 1.524 m (5')   Wt 57.6 kg    SpO2 100%   BMI 24.80 kg/m   Physical Exam  Constitutional: She is oriented to person, place, and time. She appears well-developed and well-nourished. No distress.  HENT:  Head: Normocephalic and atraumatic.  Mouth/Throat: Oropharynx is clear and moist.  Eyes: Pupils are equal, round, and reactive to light. Conjunctivae and EOM are normal.  Neck: Normal range of motion. Neck supple.  Cardiovascular: Normal rate, regular rhythm and normal heart sounds.  Pulmonary/Chest: Effort normal and breath sounds normal. No respiratory distress.  Abdominal: Soft. Bowel sounds are normal. There is no tenderness.  Musculoskeletal: Normal range of motion. She exhibits no edema.  Neurological: She is alert and oriented to person, place, and time. No cranial nerve deficit or sensory deficit. She exhibits normal muscle tone. Coordination normal.  Skin: Skin is warm. Capillary refill takes less than 2 seconds. No rash noted.  Nursing note and vitals reviewed.    ED Treatments / Results  Labs (all labs ordered are listed, but only abnormal results are displayed) Labs Reviewed  CBC - Abnormal; Notable for the following components:      Result Value   Platelets 411 (*)    All other components within normal limits  BRAIN NATRIURETIC PEPTIDE - Abnormal; Notable for the following components:   B Natriuretic Peptide 179.0 (*)    All other components within normal limits  MRSA PCR SCREENING  BASIC METABOLIC PANEL  TROPONIN I  HEPARIN LEVEL (UNFRACTIONATED)  HIV ANTIBODY (ROUTINE TESTING W REFLEX)  TROPONIN I  TROPONIN I  I-STAT TROPONIN, ED  I-STAT BETA HCG BLOOD, ED ([facility], [facility], [facility] ONLY)    EKG EKG Interpretation  Date/Time:  Sunday March 04 2018 06:32:34 EDT Ventricular Rate:  98 PR Interval:    QRS Duration: 79 QT Interval:  338 QTC Calculation: 432 R Axis:   27 Text Interpretation:  Sinus rhythm Borderline T abnormalities, lateral leads Interpretation limited secondary to artifact  Confirmed by Ripley Fraise [id number]) on  03/04/2018 6:36:52 AM Also confirmed by Fredia Sorrow [phone])  on 03/04/2018 7:34:40 AM   Radiology Dg Chest 2 View  Result Date: 03/04/2018 CLINICAL DATA:  Chest pain, nausea EXAM: CHEST - 2 VIEW COMPARISON:  11/17/2017 FINDINGS: Lungs are clear.  No pleural effusion or pneumothorax. The heart is normal in size. Visualized osseous structures are within normal limits. IMPRESSION: Normal chest radiographs. Electronically Signed   By: Julian Hy M.D.   On: 03/04/2018 07:34    Procedures Procedures (including critical care time)  Medications Ordered in ED Medications  nitroGLYCERIN (NITROSTAT) SL tablet 0.4 mg (0.4 mg Sublingual Given 03/04/18 0918)  heparin ADULT infusion 100 units/mL (25000 units/285m sodium chloride 0.45%) (600 Units/hr Intravenous New Bag/Given 03/04/18 1131)  aspirin EC tablet 81 mg (has no administration in time range)  atorvastatin (LIPITOR) tablet 80 mg (80 mg Oral Given 03/04/18 1416)  ezetimibe (ZETIA) tablet 10 mg (10 mg Oral Given 03/04/18 1416)  furosemide (LASIX) tablet 20 mg (20 mg Oral Given 03/04/18 1417)  isosorbide mononitrate (IMDUR) 24 hr tablet 30 mg (30 mg Oral Given 03/04/18 1417)  losartan (COZAAR) tablet 12.5 mg (12.5 mg Oral Given 03/04/18 1417)  metoprolol succinate (TOPROL-XL) 24 hr tablet 12.5 mg (12.5 mg Oral Given 03/04/18 1417)  ALPRAZolam (XANAX) tablet 0.5-1 mg (has no administration in time range)  LORazepam (ATIVAN) tablet 0.5 mg (0.5 mg Oral Given 03/04/18 1417)  nicotine (NICODERM CQ - dosed in mg/24 hours) patch 21 mg (21 mg Transdermal Not Given 03/04/18 1132)  rOPINIRole (REQUIP) tablet 0.5 mg (has no administration in time range)  potassium chloride SA (K-DUR,KLOR-CON) CR tablet 20 mEq (20 mEq Oral Given 03/04/18 1300)  sodium chloride flush (NS) 0.9 % injection 3 mL (has no administration in time range)  sodium chloride flush (NS) 0.9 % injection 3 mL (has no administration in  time range)  0.9 %  sodium chloride infusion (has no administration in time range)  acetaminophen (TYLENOL) tablet 650 mg (has no administration in time range)    Or  acetaminophen (TYLENOL) suppository 650 mg (has no administration in time range)  ondansetron (ZOFRAN) tablet 4 mg (has no administration in time range)    Or  ondansetron (ZOFRAN) injection 4 mg (has no administration in time range)  nitroGLYCERIN (NITROGLYN) 2 % ointment 1 inch (has no administration in time range)  aspirin chewable tablet 324 mg (324 mg Oral Given 03/04/18 0853)  ondansetron (ZOFRAN) injection 4 mg (4 mg Intravenous Given 03/04/18 0931)  morphine 2 MG/ML injection 2 mg (2 mg Intravenous Given 03/04/18 0932)  heparin bolus via infusion 3,300 Units (3,300 Units Intravenous Bolus from Bag 03/04/18 1140)     Initial Impression / Assessment and Plan / ED Course  I have reviewed the triage vital signs and the nursing notes.  Pertinent labs & imaging results that were available during my care of the patient were reviewed by me and considered in my medical decision making (see chart for details).    Patient with known history of coronary artery disease.  Making the onset of this chest pain of significant risk.  Chest x-ray negative EKG was some subtle T wave changes out laterally.  Initial troponin negative.  Patient still had chest pain when I saw her.  We gave her aspirin and gave her nitroglycerin brought the pain down from 8 to a 6 out of 10.  And gave her some morphine brought it down to a 2.  Then started Nitropaste.  Do not feel that  patient immediately warranted heparin drip or IV nitroglycerin.  Patient seen by hospitalist who will admit for rule out.   Final Clinical Impressions(s) / ED Diagnoses   Final diagnoses:  Precordial pain    ED Discharge Orders    None       Fredia Sorrow, MD 03/04/18 1601

## 2018-03-04 NOTE — H&P (Signed)
History and Physical    [name] IDC:[id number] DOB: [birth year] DOA: 03/04/2018  PCP: [name], MD   Patient coming from: Home  Chief Complaint: Chest pain  HPI: Erin [name] is a 57 y.o. female with medical history significant for CAD with prior stents on 03/2016, hypertension, dyslipidemia, combined diastolic and systolic CHF with EF of 40 to 45% and grade 1 diastolic dysfunction seen on echocardiogram 12/2017 who was recently seen by her cardiologist Dr. Bronson Ing on 02/21/2018 with discontinuation of Entresto and initiation of losartan as well as temporary increase in Lasix dose.  She presented to the ED this morning with complaints of lower substernal chest pain that began at approximately 3 AM this morning and woke her from sleep.  She states that the pain is very similar to when she had her MI on 03/2016.  She had some associated nausea with no vomiting and denies any significant radiation of the pain.  She appears to deny any associated aggravating or alleviating factors aside from nitroglycerin when given in the ED which appears to have helped her significantly.  She is currently rating her pain level 2/10.  She states that she has been taking her medications as prescribed at home with recent changes as noted above.  She states that she also ran out of her nitroglycerin at home. She denies any diaphoresis, cough, shortness of breath, abdominal pain, lower extremity edema, orthopnea, fever, or chills.   ED Course: Vital signs are stable and laboratory data with no abnormal findings.  Troponin is noted to be 0.01.  Chest x-ray with no acute abnormalities.  EKG compared to prior appears to demonstrate some lateral T wave changes in leads with V5 and V6, but there also appears to be artifact present.  Patient has been given 3 doses of sublingual nitroglycerin and will now have Nitropaste applied.  She has also received some morphine to assist with pain management as well as full dose  aspirin.  Review of Systems: All others reviewed and otherwise negative.  Past Medical History:  Diagnosis Date  . Anxiety state 03/25/2016  . Basal cell carcinoma of forehead   . CHF (congestive heart failure) ([place])   . Coronary artery disease    a. 03/11/16 PCI with DES-->Prox/Mid Cx;  b. 03/14/16 PCI with DES x2-->RCA, EF 30-35%.  . Essential hypertension   . GERD (gastroesophageal reflux disease)   . HFrEF (heart failure with reduced ejection fraction) ([place])    a. 10/2016 Echo: EF 35-40%, Gr1 DD, mild focal basal septal hypertrophy, basal inflat, mid inflat, basal antlat AK. Mid infept/inf/antlat, apical lateral sev HK. Mod MR. mildly reduced RV fxn. Mild TR.  [name] Kitchen History of pneumonia   . Hyperlipidemia   . Ischemic cardiomyopathy    a. 10/2016 Echo: EF 35-40%, Gr1 DD.  [name] Kitchen Mitral regurgitation   . NSTEMI (non-ST elevated myocardial infarction) ([place]) 03/10/2016  . Squamous cell cancer of skin of nose   . Thrombocytosis ([place]) 03/26/2016  . Tobacco abuse   . Trichimoniasis   . Wears dentures   . Wears glasses     Past Surgical History:  Procedure Laterality Date  . APPENDECTOMY    . CARDIAC CATHETERIZATION N/A 03/11/2016   Procedure: Left Heart Cath and Coronary Angiography;  Surgeon: Leonie Man, MD;  Location: [facility];  Service: Cardiovascular;  Laterality: N/A;  . CARDIAC CATHETERIZATION N/A 03/11/2016   Procedure: Coronary Stent Intervention;  Surgeon: Leonie Man, MD;  Location: [facility];  Service: Cardiovascular;  Laterality: N/A;  . CARDIAC CATHETERIZATION N/A 03/14/2016   Procedure: Coronary Stent Intervention;  Surgeon: Peter M Martinique, MD;  Location: [facility];  Service: Cardiovascular;  Laterality: N/A;  . CHOLECYSTECTOMY OPEN  1984  . CORONARY ANGIOPLASTY WITH STENT PLACEMENT  03/14/2016  . FINGER ARTHROPLASTY Left 05/14/2013   Procedure: LEFT THUMB CARPAL METACARPAL ARTHROPLASTY;  Surgeon: Tennis Must, MD;  Location: [facility];  Service: Orthopedics;  Laterality: Left;  . IR ANGIO INTRA EXTRACRAN SEL COM CAROTID INNOMINATE BILAT MOD SED  01/05/2017  . IR ANGIO VERTEBRAL SEL VERTEBRAL BILAT MOD SED  01/05/2017  . IR RADIOLOGIST EVAL & MGMT  12/30/2016  . TUBAL LIGATION  1987  . VAGINAL HYSTERECTOMY  2009     reports that she has been smoking cigarettes. She has a 1.50 pack-year smoking history. She has never used smokeless tobacco. She reports that she drinks about 6.0 standard drinks of alcohol per week. She reports that she has current or past drug history. Drug: Marijuana.  Allergies  Allergen Reactions  . Tape Other (See Comments)    PEELS SKIN OFF  (PAPER TAPE IS FINE)    Family History  Problem Relation Age of Onset  . Diabetes Father   . Hypertension Father   . CAD Father   . Colon polyps Father 60       pre-cancerous   . Stroke Mother   . Hypertension Mother   . Diabetes Mother   . Heart failure Other   . Breast cancer Maternal Grandmother   . Colon cancer Neg Hx     Prior to Admission medications   Medication Sig Start Date End Date Taking? Authorizing Provider  ALPRAZolam Duanne Moron) 1 MG tablet Take 0.5-1 mg by mouth at bedtime as needed for anxiety or sleep.     [provider]  aspirin EC 81 MG tablet Take 81 mg by mouth every evening.     [provider]  atorvastatin (LIPITOR) 80 MG tablet Take 1 tablet (80 mg total) by mouth daily. 09/01/17   Herminio Commons, MD  ezetimibe (ZETIA) 10 MG tablet Take 1 tablet (10 mg total) by mouth daily. 07/14/17   Herminio Commons, MD  furosemide (LASIX) 20 MG tablet Take 1 tablet (20 mg total) by mouth daily. 11/17/17   Sherwood Gambler, MD  isosorbide mononitrate (IMDUR) 30 MG 24 hr tablet Take 30 mg by mouth daily.    [provider]  KLOR-CON M20 20 MEQ tablet TAKE 1 TABLET BY MOUTH ONCE DAILY Patient taking differently: Take 20 mEq by mouth daily.  04/11/17   Herminio Commons, MD  LORazepam (ATIVAN) 1 MG tablet  Take 0.5 mg by mouth daily.  11/09/17   [provider]  losartan (COZAAR) 25 MG tablet Take 0.5 tablets (12.5 mg total) by mouth daily. 02/21/18 05/22/18  Herminio Commons, MD  metoprolol succinate (TOPROL XL) 25 MG 24 hr tablet Take 0.5 tablets (12.5 mg total) by mouth daily. 11/17/17   Sherwood Gambler, MD  metoprolol succinate (TOPROL-XL) 25 MG 24 hr tablet TAKE 1 TABLET BY MOUTH ONCE DAILY 03/02/18   Herminio Commons, MD  Na Sulfate-K Sulfate-Mg Sulf 17.5-3.13-1.6 GM/177ML SOLN Take 1 kit by mouth as directed. 02/27/18   Rourk, Cristopher Estimable, MD  nicotine (NICODERM CQ - DOSED IN MG/24 HOURS) 21 mg/24hr patch Place 21 mg onto the skin daily.    [provider]  rOPINIRole (REQUIP) 0.5  MG tablet Take 0.5 mg by mouth at bedtime.  11/13/17   [provider]    Physical Exam: Vitals:   03/04/18 0900 03/04/18 0930 03/04/18 1000 03/04/18 1030  BP: 114/79 121/77 124/78 120/78  Pulse: 85 74 71 70  Resp: 18 (!) 21 19 16   Temp:      TempSrc:      SpO2: 100% 96%    Weight:        Constitutional: NAD, calm, comfortable Vitals:   03/04/18 0900 03/04/18 0930 03/04/18 1000 03/04/18 1030  BP: 114/79 121/77 124/78 120/78  Pulse: 85 74 71 70  Resp: 18 (!) 21 19 16   Temp:      TempSrc:      SpO2: 100% 96%    Weight:       Eyes: lids and conjunctivae normal ENMT: Mucous membranes are moist.  Neck: normal, supple Respiratory: clear to auscultation bilaterally. Normal respiratory effort. No accessory muscle use.  Cardiovascular: Regular rate and rhythm, no murmurs. No extremity edema. Abdomen: no tenderness, no distention. Bowel sounds positive.  Musculoskeletal:  No joint deformity upper and lower extremities.   Skin: no rashes, lesions, ulcers.  Psychiatric: Normal judgment and insight. Alert and oriented x 3. Normal mood.   Labs on Admission: I have personally reviewed following labs and imaging studies  CBC: Recent Labs  Lab 03/04/18 0644  WBC 7.3  HGB 12.3    HCT 38.9  MCV 87.2  PLT 109*   Basic Metabolic Panel: Recent Labs  Lab 03/04/18 0644  NA 136  K 3.5  CL 103  CO2 25  GLUCOSE 99  BUN 19  CREATININE 0.84  CALCIUM 9.8   GFR: Estimated Creatinine Clearance: 59.4 mL/min (by C-G formula based on SCr of 0.84 mg/dL). Liver Function Tests: No results for input(s): AST, ALT, ALKPHOS, BILITOT, PROT, ALBUMIN in the last 168 hours. No results for input(s): LIPASE, AMYLASE in the last 168 hours. No results for input(s): AMMONIA in the last 168 hours. Coagulation Profile: No results for input(s): INR, PROTIME in the last 168 hours. Cardiac Enzymes: No results for input(s): CKTOTAL, CKMB, CKMBINDEX, TROPONINI in the last 168 hours. BNP (last 3 results) No results for input(s): PROBNP in the last 8760 hours. HbA1C: No results for input(s): HGBA1C in the last 72 hours. CBG: No results for input(s): GLUCAP in the last 168 hours. Lipid Profile: No results for input(s): CHOL, HDL, LDLCALC, TRIG, CHOLHDL, LDLDIRECT in the last 72 hours. Thyroid Function Tests: No results for input(s): TSH, T4TOTAL, FREET4, T3FREE, THYROIDAB in the last 72 hours. Anemia Panel: No results for input(s): VITAMINB12, FOLATE, FERRITIN, TIBC, IRON, RETICCTPCT in the last 72 hours. Urine analysis:    Component Value Date/Time   COLORURINE YELLOW 02/09/2017 [place] 02/09/2017 1204   LABSPEC 1.009 02/09/2017 1204   PHURINE 5.0 02/09/2017 1204   GLUCOSEU NEGATIVE 02/09/2017 1204   HGBUR NEGATIVE 02/09/2017 [place] 02/09/2017 [place] 02/09/2017 1204   PROTEINUR NEGATIVE 02/09/2017 1204   NITRITE NEGATIVE 02/09/2017 1204   LEUKOCYTESUR LARGE (A) 02/09/2017 1204    Radiological Exams on Admission: Dg Chest 2 View  Result Date: 03/04/2018 CLINICAL DATA:  Chest pain, nausea EXAM: CHEST - 2 VIEW COMPARISON:  11/17/2017 FINDINGS: Lungs are clear.  No pleural effusion or pneumothorax. The heart is normal  in size. Visualized osseous structures are within normal limits. IMPRESSION: Normal chest radiographs. Electronically Signed   By: Henderson Newcomer.D.  On: 03/04/2018 07:34    EKG: Independently reviewed.  Sinus rhythm at 98 bpm with some T wave inversions noted in leads V5 and V6.  Motion artifact noted.  Assessment/Plan Principal Problem:   Chest pain Active Problems:   Essential hypertension   CAD (coronary artery disease), native coronary artery   Chronic combined systolic and diastolic CHF (congestive heart failure) ([place])    1. Chest pain suspicious for angina.  She has had multiple prior admissions for atypical chest pain and has been seen by cardiology while here.  She claims to have pain reminiscent of her prior MI with essentially negative work-up noted thus far.  We will continue to monitor cardiac enzymes and will presumptively treat with heparin drip in the stepdown unit for now as her pain is not currently fully controlled and due to concern for possible angina.  We will also consult with cardiology in a.m.  Repeat EKG in a.m.  Nitropaste for pain relief with consideration of nitroglycerin drip as needed.  Continue home aspirin, Imdur, statin, and beta-blocker. 2. Essential hypertension.  Appears to be well controlled currently.  Will monitor in the presence of nitroglycerin administration.  Plan to continue home Lasix, Imdur, losartan, and metoprolol. 3. Chronic combined systolic and diastolic CHF with LVEF 40 to 45%.  Appears to be at baseline, but will monitor daily weights and maintain on home Lasix.  Consideration for echocardiogram per cardiology as recent one was performed on 12/2017. 4. CAD with prior stents on 03/2016.  Maintain on aspirin.  Plavix had been discontinued approximately 1 year ago.  Maintain on beta-blocker as well as Imdur and statin.   DVT prophylaxis: Heparin drip Code Status: Full code Family Communication: Spouse at bedside Disposition Plan: ACS  evaluation Consults called: Cardiology in computer for a.m. Admission status: Inpatient, stepdown unit   Trenda Corliss Darleen Crocker DO [facility] Pager [phone]  If 7PM-7AM, please contact night-coverage [email or web address] Password [email or web address]  03/04/2018, 11:04 AM

## 2018-03-04 NOTE — Assessment & Plan Note (Signed)
Chronic dysphagia, now worsening with solid and liquid components. Denies typical GERD symptoms. Will need EGD in near future.  Proceed with upper endoscopy/dilation in the near future with Dr. Gala Romney. The risks, benefits, and alternatives have been discussed in detail with patient. They have stated understanding and desire to proceed.  Propofol due to polypharmacy Hold off on PPI now unless absolutely indicated after EGD/dilation due to history of CDI

## 2018-03-04 NOTE — Assessment & Plan Note (Signed)
57 year old with need for initial screening colonoscopy. Father with pre-cancerous polyps age 22. No family history of colorectal cancer. As of note, treated for uncomplicated CDI Aug 6734 with vancomycin and now back to normal baseline.  Proceed with TCS with Dr. Gala Romney in near future: the risks, benefits, and alternatives have been discussed with the patient in detail. The patient states understanding and desires to proceed. Propofol due to polypharmacy

## 2018-03-05 ENCOUNTER — Telehealth: Payer: Self-pay

## 2018-03-05 LAB — CBC
HCT: 29.9 % — ABNORMAL LOW (ref 36.0–46.0)
Hemoglobin: 9.6 g/dL — ABNORMAL LOW (ref 12.0–15.0)
MCH: 27.8 pg (ref 26.0–34.0)
MCHC: 32.1 g/dL (ref 30.0–36.0)
MCV: 86.7 fL (ref 80.0–100.0)
Platelets: 343 10*3/uL (ref 150–400)
RBC: 3.45 MIL/uL — ABNORMAL LOW (ref 3.87–5.11)
RDW: 15.5 % (ref 11.5–15.5)
WBC: 6.6 10*3/uL (ref 4.0–10.5)
nRBC: 0 % (ref 0.0–0.2)

## 2018-03-05 LAB — BASIC METABOLIC PANEL
Anion gap: 8 (ref 5–15)
BUN: 21 mg/dL — ABNORMAL HIGH (ref 6–20)
CO2: 25 mmol/L (ref 22–32)
Calcium: 8.7 mg/dL — ABNORMAL LOW (ref 8.9–10.3)
Chloride: 105 mmol/L (ref 98–111)
Creatinine, Ser: 0.89 mg/dL (ref 0.44–1.00)
GFR calc Af Amer: >60 mL/min (ref >60)
GFR calc non Af Amer: >60 mL/min (ref >60)
Glucose, Bld: 101 mg/dL — ABNORMAL HIGH (ref 70–99)
Potassium: 3.6 mmol/L (ref 3.5–5.1)
Sodium: 138 mmol/L (ref 135–145)

## 2018-03-05 LAB — HEMOGLOBIN AND HEMATOCRIT, BLOOD
HCT: 32 % — ABNORMAL LOW (ref 36.0–46.0)
Hemoglobin: 10.2 g/dL — ABNORMAL LOW (ref 12.0–15.0)

## 2018-03-05 LAB — TROPONIN I: Troponin I: <0.03 ng/mL (ref <0.03)

## 2018-03-05 LAB — HIV ANTIBODY (ROUTINE TESTING W REFLEX): HIV Screen 4th Generation wRfx: NONREACTIVE

## 2018-03-05 LAB — HEPARIN LEVEL (UNFRACTIONATED): Heparin Unfractionated: 0.2 IU/mL — ABNORMAL LOW (ref 0.30–0.70)

## 2018-03-05 MED ORDER — NITROGLYCERIN 0.4 MG SL SUBL: 0.4000 mg | SUBLINGUAL_TABLET | SUBLINGUAL | 12 refills | Status: DC | PRN

## 2018-03-05 MED ORDER — MUPIROCIN 2 % EX OINT
1.0000 "application " | TOPICAL_OINTMENT | Freq: Two times a day (BID) | CUTANEOUS | Status: DC
  Administered 2018-03-05: 1 via NASAL

## 2018-03-05 MED ORDER — CHLORHEXIDINE GLUCONATE CLOTH 2 % EX PADS
6.0000 | MEDICATED_PAD | Freq: Every day | CUTANEOUS | Status: DC
  Administered 2018-03-05: 6 via TOPICAL

## 2018-03-05 MED ORDER — HEPARIN BOLUS VIA INFUSION
1000.0000 [IU] | Freq: Once | INTRAVENOUS | Status: AC
  Administered 2018-03-05: 1000 [IU] via INTRAVENOUS
  Filled 2018-03-05: qty 1000

## 2018-03-05 NOTE — Telephone Encounter (Signed)
Need to cancel colonoscopy until she is discharged. Can evaluate again in the office. I believe she also has an EGD scheduled on a separate date. Would be great to have this both at same time.

## 2018-03-05 NOTE — Discharge Summary (Signed)
Physician Discharge Summary  Erin [name]:[id number] DOB: Jan 01, 1961 DOA: 03/04/2018  PCP: [name], MD  Admit date: 03/04/2018  Discharge date: 03/05/2018  Admitted From:Home  Disposition:  Home  Recommendations for Outpatient Follow-up:  1. Follow up with PCP in 1-2 weeks  Home Health:None  Equipment/Devices:None  Discharge Condition:Stable  CODE STATUS: Full  Diet recommendation: Heart Healthy  Brief/Interim Summary: Per HPI: [name] is a 57 y.o. female with medical history significant for CAD with prior stents on 03/2016, hypertension, dyslipidemia, combined diastolic and systolic CHF with EF of 40 to 45% and grade 1 diastolic dysfunction seen on echocardiogram 12/2017 who was recently seen by her cardiologist Dr. Bronson Ing on 02/21/2018 with discontinuation of Entresto and initiation of losartan as well as temporary increase in Lasix dose.  She presented to the ED this morning with complaints of lower substernal chest pain that began at approximately 3 AM this morning and woke her from sleep.  She states that the pain is very similar to when she had her MI on 03/2016.  She had some associated nausea with no vomiting and denies any significant radiation of the pain.  She appears to deny any associated aggravating or alleviating factors aside from nitroglycerin when given in the ED which appears to have helped her significantly.  She is currently rating her pain level 2/10.  She states that she has been taking her medications as prescribed at home with recent changes as noted above.  She states that she also ran out of her nitroglycerin at home. She denies any diaphoresis, cough, shortness of breath, abdominal pain, lower extremity edema, orthopnea, fever, or chills.  She was admitted with symptoms of atypical chest pain and had no further occurrences during this admission. Cardiac enzymes x 3 sets were negative and EKG with no changes noted. Discussed with Dr.  Harl Bowie of Cardiology who feels that this is atypical in nature and requires no further inpatient workup. Will follow up soon with her Cardiologist Dr. Bronson Ing in the outpatient setting.  Discharge Diagnoses:  Principal Problem:   Chest pain Active Problems:   Essential hypertension   CAD (coronary artery disease), native coronary artery   Chronic combined systolic and diastolic CHF (congestive heart failure) [facility])    Discharge Instructions  Discharge Instructions    Diet - low sodium heart healthy   Complete by:  As directed    Increase activity slowly   Complete by:  As directed      Allergies as of 03/05/2018      Reactions   Tape Other (See Comments)   PEELS SKIN OFF  (PAPER TAPE IS FINE)      Medication List    TAKE these medications   acetaminophen 325 MG tablet Commonly known as:  TYLENOL Take by mouth every 6 (six) hours as needed for mild pain or headache.   ALPRAZolam 1 MG tablet Commonly known as:  XANAX Take 0.5-1 mg by mouth at bedtime as needed for anxiety or sleep.   aspirin EC 81 MG tablet Take 81 mg by mouth every evening.   atorvastatin 80 MG tablet Commonly known as:  LIPITOR Take 1 tablet (80 mg total) by mouth daily.   ezetimibe 10 MG tablet Commonly known as:  ZETIA Take 1 tablet (10 mg total) by mouth daily.   furosemide 20 MG tablet Commonly known as:  LASIX Take 1 tablet (20 mg total) by mouth daily.   isosorbide mononitrate 30 MG 24 hr tablet Commonly known  as:  IMDUR Take 30 mg by mouth daily.   KLOR-CON M20 20 MEQ tablet Generic drug:  potassium chloride SA TAKE 1 TABLET BY MOUTH ONCE DAILY What changed:  how much to take   LORazepam 1 MG tablet Commonly known as:  ATIVAN Take 0.5 mg by mouth daily.   losartan 25 MG tablet Commonly known as:  COZAAR Take 0.5 tablets (12.5 mg total) by mouth daily.   metoprolol succinate 25 MG 24 hr tablet Commonly known as:  TOPROL-XL Take 0.5 tablets (12.5 mg total) by mouth  daily.   Na Sulfate-K Sulfate-Mg Sulf 17.5-3.13-1.6 GM/177ML Soln Take 1 kit by mouth as directed.   nicotine 21 mg/24hr patch Commonly known as:  NICODERM CQ - dosed in mg/24 hours Place 21 mg onto the skin daily.   nitroGLYCERIN 0.4 MG SL tablet Commonly known as:  NITROSTAT Place 1 tablet (0.4 mg total) under the tongue every 5 (five) minutes x 3 doses as needed for chest pain.   rOPINIRole 0.5 MG tablet Commonly known as:  REQUIP Take 0.5 mg by mouth at bedtime.      Follow-up Information    [name], MD Follow up in 1 week(s).   Specialty:  Internal Medicine Contact information: [place] [address] [phone]        Herminio Commons, MD .   Specialty:  Cardiology Contact information: [place] [address] [phone]          Allergies  Allergen Reactions  . Tape Other (See Comments)    PEELS SKIN OFF  (PAPER TAPE IS FINE)    Consultations:  None   Procedures/Studies: Dg Chest 2 View  Result Date: 03/04/2018 CLINICAL DATA:  Chest pain, nausea EXAM: CHEST - 2 VIEW COMPARISON:  11/17/2017 FINDINGS: Lungs are clear.  No pleural effusion or pneumothorax. The heart is normal in size. Visualized osseous structures are within normal limits. IMPRESSION: Normal chest radiographs. Electronically Signed   By: Julian Hy M.D.   On: 03/04/2018 07:34     Discharge Exam: Vitals:   03/05/18 1300 03/05/18 1400  BP: 108/76 98/66  Pulse: 81 79  Resp: (!) 23 19  Temp:    SpO2: 99% 99%   Vitals:   03/05/18 1140 03/05/18 1200 03/05/18 1300 03/05/18 1400  BP:  118/76 108/76 98/66  Pulse:  72 81 79  Resp:  19 (!) 23 19  Temp: 98.6 F (37 C)     TempSrc: Oral     SpO2:  96% 99% 99%  Weight:      Height:        General: Pt is alert, awake, not in acute distress Cardiovascular: RRR, S1/S2 +, no rubs, no gallops Respiratory: CTA bilaterally, no wheezing, no rhonchi Abdominal: Soft, NT, ND, bowel  sounds + Extremities: no edema, no cyanosis    The results of significant diagnostics from this hospitalization (including imaging, microbiology, ancillary and laboratory) are listed below for reference.     Microbiology: Recent Results (from the past 240 hour(s))  MRSA PCR Screening     Status: None   Collection Time: 03/04/18 11:23 AM  Result Value Ref Range Status   MRSA by PCR NEGATIVE NEGATIVE Final    Comment:        The GeneXpert MRSA Assay (FDA approved for NASAL specimens only), is one component of a comprehensive MRSA colonization surveillance program. It is not intended to diagnose MRSA infection nor to guide or  monitor treatment for MRSA infections. Performed at [facility], [address], [place], [place] [address]      Labs: BNP (last 3 results) Recent Labs    03/04/18 0644  BNP 802.2*   Basic Metabolic Panel: Recent Labs  Lab 03/04/18 0644 03/05/18 0449  NA 136 138  K 3.5 3.6  CL 103 105  CO2 25 25  GLUCOSE 99 101*  BUN 19 21*  CREATININE 0.84 0.89  CALCIUM 9.8 8.7*   Liver Function Tests: No results for input(s): AST, ALT, ALKPHOS, BILITOT, PROT, ALBUMIN in the last 168 hours. No results for input(s): LIPASE, AMYLASE in the last 168 hours. No results for input(s): AMMONIA in the last 168 hours. CBC: Recent Labs  Lab 03/04/18 0644 03/05/18 0449 03/05/18 1328  WBC 7.3 6.6  --   HGB 12.3 9.6* 10.2*  HCT 38.9 29.9* 32.0*  MCV 87.2 86.7  --   PLT 411* 343  --    Cardiac Enzymes: Recent Labs  Lab 03/04/18 1139 03/04/18 1706 03/04/18 2317  TROPONINI <0.03 <0.03 <0.03   BNP: Invalid input(s): POCBNP CBG: No results for input(s): GLUCAP in the last 168 hours. D-Dimer No results for input(s): DDIMER in the last 72 hours. Hgb A1c No results for input(s): HGBA1C in the last 72 hours. Lipid Profile No results for input(s): CHOL, HDL, LDLCALC, TRIG, CHOLHDL, LDLDIRECT in the last 72 hours. Thyroid function studies No results for  input(s): TSH, T4TOTAL, T3FREE, THYROIDAB in the last 72 hours.  Invalid input(s): FREET3 Anemia work up No results for input(s): VITAMINB12, FOLATE, FERRITIN, TIBC, IRON, RETICCTPCT in the last 72 hours. Urinalysis    Component Value Date/Time   COLORURINE YELLOW 02/09/2017 1204   APPEARANCEUR CLEAR 02/09/2017 1204   LABSPEC 1.009 02/09/2017 1204   PHURINE 5.0 02/09/2017 1204   GLUCOSEU NEGATIVE 02/09/2017 1204   HGBUR NEGATIVE 02/09/2017 1204   BILIRUBINUR NEGATIVE 02/09/2017 [place] 02/09/2017 1204   PROTEINUR NEGATIVE 02/09/2017 1204   NITRITE NEGATIVE 02/09/2017 1204   LEUKOCYTESUR LARGE (A) 02/09/2017 1204   Sepsis Labs Invalid input(s): PROCALCITONIN,  WBC,  LACTICIDVEN Microbiology Recent Results (from the past 240 hour(s))  MRSA PCR Screening     Status: None   Collection Time: 03/04/18 11:23 AM  Result Value Ref Range Status   MRSA by PCR NEGATIVE NEGATIVE Final    Comment:        The GeneXpert MRSA Assay (FDA approved for NASAL specimens only), is one component of a comprehensive MRSA colonization surveillance program. It is not intended to diagnose MRSA infection nor to guide or monitor treatment for MRSA infections. Performed at [facility], [address], [place], [place] [address]      Time coordinating discharge: 35 minutes  SIGNED:   Rodena Goldmann, DO [facility] 03/05/2018, 4:50 PM Pager [phone]  If 7PM-7AM, please contact night-coverage [email or web address] Password [email or web address]

## 2018-03-05 NOTE — Telephone Encounter (Signed)
AB advised to cancel upcoming procedures and schedule OV d/t hospital admission. LMOVM for endo scheduler to cancel TCS and EGD. Called and informed pt. OV scheduled for 06/06/18 at 2:30pm. Appt letter mailed.

## 2018-03-05 NOTE — Telephone Encounter (Signed)
Erin [name] at [facility] called office, pt is scheduled for TCS w/Propofol 03/08/18. She is currently admitted to [facility] ICU d/t chest pain.  Routing to AB for advice.

## 2018-03-05 NOTE — Progress Notes (Signed)
ANTICOAGULATION CONSULT NOTE -   Pharmacy Consult for heparin Indication: chest pain/ACS  Allergies  Allergen Reactions  . Tape Other (See Comments)    PEELS SKIN OFF  (PAPER TAPE IS FINE)    Patient Measurements: Height: 5' (152.4 cm) Weight: 128 lb 8.5 oz (58.3 kg) IBW/kg (Calculated) : 45.5 Heparin Dosing Weight: 56 kg  Vital Signs: Temp: 98.1 F (36.7 C) (10/28 0400) BP: 112/76 (10/28 0700) Pulse Rate: 70 (10/28 0700)  Labs: Recent Labs    03/04/18 0644 03/04/18 1139 03/04/18 1706 03/04/18 2317 03/05/18 0449  HGB 12.3  --   --   --  9.6*  HCT 38.9  --   --   --  29.9*  PLT 411*  --   --   --  343  HEPARINUNFRC  --   --  0.35  --  0.20*  CREATININE 0.84  --   --   --  0.89  TROPONINI  --  <0.03 <0.03 <0.03  --     Estimated Creatinine Clearance: 57.1 mL/min (by C-G formula based on SCr of 0.89 mg/dL).   Medical History: Past Medical History:  Diagnosis Date  . Anxiety state 03/25/2016  . Basal cell carcinoma of forehead   . CHF (congestive heart failure) ([place])   . Coronary artery disease    a. 03/11/16 PCI with DES-->Prox/Mid Cx;  b. 03/14/16 PCI with DES x2-->RCA, EF 30-35%.  . Essential hypertension   . GERD (gastroesophageal reflux disease)   . HFrEF (heart failure with reduced ejection fraction) ([place])    a. 10/2016 Echo: EF 35-40%, Gr1 DD, mild focal basal septal hypertrophy, basal inflat, mid inflat, basal antlat AK. Mid infept/inf/antlat, apical lateral sev HK. Mod MR. mildly reduced RV fxn. Mild TR.  [name] Kitchen History of pneumonia   . Hyperlipidemia   . Ischemic cardiomyopathy    a. 10/2016 Echo: EF 35-40%, Gr1 DD.  [name] Kitchen Mitral regurgitation   . NSTEMI (non-ST elevated myocardial infarction) ([place]) 03/10/2016  . Squamous cell cancer of skin of nose   . Thrombocytosis ([place]) 03/26/2016  . Tobacco abuse   . Trichimoniasis   . Wears dentures   . Wears glasses     Medications:  Medications Prior to Admission  Medication Sig Dispense Refill Last Dose  .  acetaminophen (TYLENOL) 325 MG tablet Take by mouth every 6 (six) hours as needed for mild pain or headache.   Past Week at Unknown time  . ALPRAZolam (XANAX) 1 MG tablet Take 0.5-1 mg by mouth at bedtime as needed for anxiety or sleep.    03/03/2018 at Unknown time  . aspirin EC 81 MG tablet Take 81 mg by mouth every evening.    03/03/2018 at Unknown time  . atorvastatin (LIPITOR) 80 MG tablet Take 1 tablet (80 mg total) by mouth daily. 90 tablet 1 03/03/2018 at Unknown time  . ezetimibe (ZETIA) 10 MG tablet Take 1 tablet (10 mg total) by mouth daily. 30 tablet 11 03/03/2018 at Unknown time  . furosemide (LASIX) 20 MG tablet Take 1 tablet (20 mg total) by mouth daily. 30 tablet 0 03/03/2018 at Unknown time  . isosorbide mononitrate (IMDUR) 30 MG 24 hr tablet Take 30 mg by mouth daily.   03/03/2018 at Unknown time  . KLOR-CON M20 20 MEQ tablet TAKE 1 TABLET BY MOUTH ONCE DAILY (Patient taking differently: Take 20 mEq by mouth daily. ) 90 tablet 3 03/03/2018 at Unknown time  . LORazepam (ATIVAN) 1 MG tablet Take 0.5 mg by mouth  daily.   1 03/03/2018 at Unknown time  . losartan (COZAAR) 25 MG tablet Take 0.5 tablets (12.5 mg total) by mouth daily. 45 tablet 3 03/03/2018 at Unknown time  . metoprolol succinate (TOPROL XL) 25 MG 24 hr tablet Take 0.5 tablets (12.5 mg total) by mouth daily. 30 tablet 0 03/03/2018 at 1800  . Na Sulfate-K Sulfate-Mg Sulf 17.5-3.13-1.6 GM/177ML SOLN Take 1 kit by mouth as directed. 1 Bottle 0 03/03/2018 at Unknown time  . nicotine (NICODERM CQ - DOSED IN MG/24 HOURS) 21 mg/24hr patch Place 21 mg onto the skin daily.   03/03/2018 at Unknown time  . rOPINIRole (REQUIP) 0.5 MG tablet Take 0.5 mg by mouth at bedtime.   0 03/03/2018 at Unknown time    Assessment: Pharmacy consulted to dose heparin in patient with chest pain/ACS. Troponins <0.03.  Heparin level subtherapeutic this AM.   Goal of Therapy:  Heparin level 0.3-0.7 units/ml Monitor platelets by anticoagulation  protocol: Yes   Plan:  Heparin bolus 1000 units and increase  heparin infusion to 700 units/hr Check anti-Xa level in 6-8 hours and daily while on heparin. Continue to monitor H&H and platelets.  Isac Sarna, BS Vena Austria, BCPS Clinical Pharmacist Pager [phone] 03/05/2018,7:58 AM

## 2018-03-05 NOTE — Progress Notes (Signed)
Pt discharged from facility in stable condition. No complaints of pain. Discharge instruction given to patient and family they verbalized understanding.   Jeris Penta, RN

## 2018-03-05 NOTE — Pre-Procedure Instructions (Signed)
Doing workup for PAT and see that patient was admitted to ICU for CP yesterday. She is supposed to have PAT 10/29 for TCS on 03/08/2018. I called Dr Roseanne Kaufman office to let Roseanne Kaufman know that she is admitted.

## 2018-03-05 NOTE — Progress Notes (Signed)
cc'ed to pcp °

## 2018-03-06 ENCOUNTER — Encounter (HOSPITAL_COMMUNITY): Payer: Self-pay

## 2018-03-06 ENCOUNTER — Encounter (HOSPITAL_COMMUNITY)
Admission: RE | Admit: 2018-03-06 | Discharge: 2018-03-06 | Disposition: A | Discharge status: Home / Self Care | Payer: Managed Care, Other (non HMO) | Source: Ambulatory Visit | Attending: Internal Medicine | Admitting: Internal Medicine

## 2018-03-08 ENCOUNTER — Encounter (HOSPITAL_COMMUNITY): Admission: RE | Payer: Self-pay | Source: Ambulatory Visit

## 2018-03-08 ENCOUNTER — Ambulatory Visit (HOSPITAL_COMMUNITY)
Admission: RE | Admit: 2018-03-08 | Payer: Managed Care, Other (non HMO) | Source: Ambulatory Visit | Admitting: Internal Medicine

## 2018-03-08 SURGERY — COLONOSCOPY WITH PROPOFOL
Anesthesia: Monitor Anesthesia Care

## 2018-03-19 ENCOUNTER — Other Ambulatory Visit: Payer: Self-pay

## 2018-03-19 MED ORDER — FUROSEMIDE 20 MG PO TABS: 20.0000 mg | ORAL_TABLET | Freq: Every day | ORAL | 3 refills | Status: DC

## 2018-03-19 NOTE — Telephone Encounter (Signed)
90 day refill lasix escribed per pharmacy request for lasix 20 mg

## 2018-03-21 ENCOUNTER — Other Ambulatory Visit (HOSPITAL_COMMUNITY): Payer: Managed Care, Other (non HMO)

## 2018-03-26 ENCOUNTER — Encounter (HOSPITAL_COMMUNITY): Admission: RE | Payer: Self-pay | Source: Ambulatory Visit

## 2018-03-26 ENCOUNTER — Telehealth: Payer: Self-pay | Admitting: Cardiovascular Disease

## 2018-03-26 ENCOUNTER — Ambulatory Visit (HOSPITAL_COMMUNITY)
Admission: RE | Admit: 2018-03-26 | Payer: Managed Care, Other (non HMO) | Source: Ambulatory Visit | Admitting: Internal Medicine

## 2018-03-26 DIAGNOSIS — I5042 Chronic combined systolic (congestive) and diastolic (congestive) heart failure: Secondary | ICD-10-CM

## 2018-03-26 SURGERY — ESOPHAGOGASTRODUODENOSCOPY (EGD) WITH PROPOFOL
Anesthesia: Monitor Anesthesia Care

## 2018-03-26 NOTE — Telephone Encounter (Signed)
Per pt phone call-- pt has gained some weight since her lasix has been changed. States she has some tightness in her belly and sometimes it's hard to breath.

## 2018-03-26 NOTE — Telephone Encounter (Signed)
Called pt. No answer. Left message for pt to return call.  

## 2018-03-27 NOTE — Telephone Encounter (Signed)
Called pt. She states that since her office visit with Dr. Bronson Ing she has gained 5 lbs. She states that changing her lasix has caused her to have more fluid in her feet, legs and stomach area. She denies chest pain, but stated that she does have some pressure at times. She does notice that she is more SOB since her lasix dose was changed. She asked if she could take an extra 20 mg of lasix on days she has swelling. Please advise. I will forward to [place], [name]

## 2018-03-27 NOTE — Telephone Encounter (Signed)
   By review of Dr. Court Joy last office note, he reported that if she felt better on the higher dosing of Lasix, he may decide to increase her daily dose to 40 mg daily. He did stop Entresto at that time which does have a diuretic component and that might be why she has experienced worsening fluid retention. She can take an extra 20 mg of Lasix on the days she has swelling and would advise taking an extra 20 mEq of potassium when she does this.  If having to do this more than a few times per week, would recommend going to 40 mg daily and K-dur 40 mEq daily with plans for repeat BMET in 2 weeks.  Signed, Erma Heritage, PA-C 03/27/2018, 11:53 AM Pager: [phone]

## 2018-03-27 NOTE — Telephone Encounter (Signed)
Pt made aware of instructions. She will have lab work done in 2 weeks. She will call to update if she has to stay on increased dose of lasix. 6

## 2018-04-02 NOTE — Progress Notes (Signed)
Cardiology Office Note    Date:  04/02/2018   ID:  Erin [name] 1961-03-18, MRN [id number]  PCP:  Erin Gravel, MD  Cardiologist: Erin Sable, MD EPS: None  No chief complaint on file.   History of Present Illness:  Erin [name] is a 57 y.o. female with history of CAD status post DES to the circumflex and staged DES to the RCA/PDA 03/2016.  NST 03/2017 large scar with minimal peri-infarct ischemia.  Patient also has chronic combined systolic and diastolic CHF, hypertension, HLD.  Echo 12/2017 slightly improved LVEF 40 to 45% with grade 1 DD mild MR and TR.  Small circumferential pericardial effusion.  Patient last saw Dr. Bronson [name] 02/21/2018 which time she was complaining of fatigue and low blood pressures in the 80s and 90 range.  Also complaining of bilateral foot swelling.  He stopped Entresto and started losartan 12.5 mg daily.  Lasix was increased to 40 mg daily for 4 days and then back to 20 daily.  Patient was hospitalized overnight 03/04/2018 with chest pain that woke her from sleep.  Cardiac enzymes were negative x3 and EKG without change.  Patient thinks it was stress related as her and her husband are getting a divorce and her daughter overdosed.  She is taking care of both her daughters children, 7 in total, ages  77-17.  No further chest pain.  Has to take extra Lasix on occasion for edema.  Denies extra salt.  Says she does not eat much and has chronic stomach problems.  Scheduled to see GI in January.  Past Medical History:  Diagnosis Date  . Anxiety state 03/25/2016  . Basal cell carcinoma of forehead   . CHF (congestive heart failure) ([place])   . Coronary artery disease    a. 03/11/16 PCI with DES-->Prox/Mid Cx;  b. 03/14/16 PCI with DES x2-->RCA, EF 30-35%.  . Essential hypertension   . GERD (gastroesophageal reflux disease)   . HFrEF (heart failure with reduced ejection fraction) ([place])    a. 10/2016 Echo: EF 35-40%, Gr1 DD, mild focal basal septal  hypertrophy, basal inflat, mid inflat, basal antlat AK. Mid infept/inf/antlat, apical lateral sev HK. Mod MR. mildly reduced RV fxn. Mild TR.  [name] Kitchen History of pneumonia   . Hyperlipidemia   . Ischemic cardiomyopathy    a. 10/2016 Echo: EF 35-40%, Gr1 DD.  [name] Kitchen Mitral regurgitation   . NSTEMI (non-ST elevated myocardial infarction) ([place]) 03/10/2016  . Squamous cell cancer of skin of nose   . Thrombocytosis ([place]) 03/26/2016  . Tobacco abuse   . Trichimoniasis   . Wears dentures   . Wears glasses     Past Surgical History:  Procedure Laterality Date  . APPENDECTOMY    . CARDIAC CATHETERIZATION N/A 03/11/2016   Procedure: Left Heart Cath and Coronary Angiography;  Surgeon: Leonie Man, MD;  Location: [facility];  Service: Cardiovascular;  Laterality: N/A;  . CARDIAC CATHETERIZATION N/A 03/11/2016   Procedure: Coronary Stent Intervention;  Surgeon: Leonie Man, MD;  Location: [facility];  Service: Cardiovascular;  Laterality: N/A;  . CARDIAC CATHETERIZATION N/A 03/14/2016   Procedure: Coronary Stent Intervention;  Surgeon: Peter M Martinique, MD;  Location: [facility];  Service: Cardiovascular;  Laterality: N/A;  . CHOLECYSTECTOMY OPEN  1984  . CORONARY ANGIOPLASTY WITH STENT PLACEMENT  03/14/2016  . FINGER ARTHROPLASTY Left 05/14/2013   Procedure: LEFT THUMB CARPAL METACARPAL ARTHROPLASTY;  Surgeon: Tennis Must, MD;  Location: [facility];  Service: Orthopedics;  Laterality: Left;  . IR ANGIO INTRA EXTRACRAN SEL COM CAROTID INNOMINATE BILAT MOD SED  01/05/2017  . IR ANGIO VERTEBRAL SEL VERTEBRAL BILAT MOD SED  01/05/2017  . IR RADIOLOGIST EVAL & MGMT  12/30/2016  . TUBAL LIGATION  1987  . VAGINAL HYSTERECTOMY  2009    Current Medications: No outpatient medications have been marked as taking for the 04/09/18 encounter (Appointment) with Imogene Burn, PA-C.     Allergies:   Tape   Social History   Socioeconomic History  . Marital status: Married      Spouse name: Not on file  . Number of children: Not on file  . Years of education: Not on file  . Highest education level: Not on file  Occupational History  . Occupation: CNA  Social Needs  . Financial resource strain: Not on file  . Food insecurity:    Worry: Not on file    Inability: Not on file  . Transportation needs:    Medical: Not on file    Non-medical: Not on file  Tobacco Use  . Smoking status: Current Some Day Smoker    Packs/day: 0.10    Years: 15.00    Pack years: 1.50    Types: Cigarettes  . Smokeless tobacco: Never Used  . Tobacco comment: smokes a cigarette occasionally  Substance and Sexual Activity  . Alcohol use: Yes    Alcohol/week: 6.0 standard drinks    Types: 6 Cans of beer per week    Comment: three times a week, will have glass of wine and beer   . Drug use: Not Currently    Types: Marijuana    Comment: former  . Sexual activity: Not Currently    Birth control/protection: Surgical    Comment: hyst  Lifestyle  . Physical activity:    Days per week: Not on file    Minutes per session: Not on file  . Stress: Not on file  Relationships  . Social connections:    Talks on phone: Not on file    Gets together: Not on file    Attends religious service: Not on file    Active member of club or organization: Not on file    Attends meetings of clubs or organizations: Not on file    Relationship status: Not on file  Other Topics Concern  . Not on file  Social History Narrative   Lives with husband in [place] in a one story home with a basement.  Has 4 children.  Works as a Quarry manager.  Education: CNA school.      Family History:  The patient's family history includes Breast cancer in her maternal grandmother; CAD in her father; Colon polyps (age of onset: 95) in her father; Diabetes in her father and mother; Heart failure in her other; Hypertension in her father and mother; Stroke in her mother.   ROS:   Please see the history of present illness.     Review of Systems  Constitution: Negative.  HENT: Negative.   Eyes: Negative.   Cardiovascular: Positive for leg swelling.  Respiratory: Negative.   Hematologic/Lymphatic: Negative.   Musculoskeletal: Negative.  Negative for joint pain.  Gastrointestinal: Positive for bloating, abdominal pain and nausea.  Genitourinary: Negative.   Neurological: Negative.   Psychiatric/Behavioral: The patient is nervous/anxious.    All other systems reviewed and are negative.   PHYSICAL EXAM:   VS:  There were no vitals taken for this visit.  Physical Exam  GEN: Well nourished, well developed, in no acute distress  Neck: no JVD, carotid bruits, or masses Cardiac:RRR; no murmurs, rubs, or gallops  Respiratory:  clear to auscultation bilaterally, normal work of breathing GI: soft, nontender, nondistended, + BS Ext: without cyanosis, clubbing, or edema, Good distal pulses bilaterally Neuro:  Alert and Oriented x 3 Psych: euthymic mood, full affect  Wt Readings from Last 3 Encounters:  03/05/18 128 lb 8.5 oz (58.3 kg)  02/27/18 125 lb 9.6 oz (57 kg)  02/21/18 130 lb (59 kg)      Studies/Labs Reviewed:   EKG:  EKG is not ordered today.   Recent Labs: 06/26/2017: TSH 0.60 07/31/2017: ALT 15 03/04/2018: B Natriuretic Peptide 179.0 03/05/2018: BUN 21; Creatinine, Ser 0.89; Hemoglobin 10.2; Platelets 343; Potassium 3.6; Sodium 138   Lipid Panel    Component Value Date/Time   CHOL 179 03/19/2017 0643   TRIG 245 (H) 03/19/2017 0643   HDL 49 03/19/2017 0643   CHOLHDL 3.7 03/19/2017 0643   VLDL 49 (H) 03/19/2017 0643   LDLCALC 81 03/19/2017 0643    Additional studies/ records that were reviewed today include:  2D echo 8/14/2019Study Conclusions   - Left ventricle: The cavity size was normal. Wall thickness was   increased in a pattern of mild LVH. Systolic function was mildly   to moderately reduced. The estimated ejection fraction was in the   range of 40% to 45%. There is akinesis of  the   basal-midinferolateral myocardium. Doppler parameters are   consistent with abnormal left ventricular relaxation (grade 1   diastolic dysfunction). - Aortic valve: Mildly calcified annulus. Trileaflet. - Mitral valve: Mildly calcified annulus. There was mild   regurgitation. - Right atrium: Central venous pressure (est): 3 mm Hg. - Atrial septum: No defect or patent foramen ovale was identified. - Tricuspid valve: There was mild regurgitation. - Pulmonary arteries: PA peak pressure: 26 mm Hg (S). - Pericardium, extracardiac: A small pericardial effusion was   identified circumferential to the heart.    Cardiac cath 11/2017Prox-Mid Cx lesion, 100 %stenosed.  A STENT SYNERGY DES 3X12 drug eluting stent was successfully placed, and overlaps previously placed stent.  Post intervention, there is a 0% residual stenosis.  Dist RCA lesion, 80 %stenosed. - Would consider treatment in a staged fashion, could either be done during this hospitalization or as an outpatient.  The left ventricular ejection fraction is 45-50% by visual estimate.  There is severe (4+) mitral regurgitation.  LV end diastolic pressure is severely elevated.     Pathophysiologically, this is equivalent to a non-ST elevation MI, however with no EKG changes, is technically non-STEMI.   She will be admitted to [facility] post procedure unit/stepdown. Was chest pain-free upon completion of the procedure.   Plan:  TR band removal per protocol  Continue Angiomax until current bag completed  Nitroglycerin infusion weaned from 40 g/min down to 20. Would continue to wean overnight.  1dose IV Lasix 40 mg for elevated LVEDP and MR  Order home dose metoprolol for this evening, and start ACE inhibitor tomorrow.  Dual antiplatelet therapy for minimum of 3 months, could potentially consider stopping aspirin after 3 months  We'll need 2-D echocardiogram to clarify extent of MR   Based on the existing lesion, and MR,  would not fast-track discharge     Glenetta Hew, M.D., M.S. Interventional Cardiologist   Mid Cx lesion, 0 %stenosed.  A drug eluting .  A STENT PROMUS PREM MR 2.25X16 drug  eluting stent was successfully placed.  A STENT PROMUS PREM MR 2.25X8 drug-eluting stent was successfully placed.  Dist RCA lesion, 60 %stenosed.  Post intervention, there is a 0% residual stenosis.  Post Atrio lesion, 0 %stenosed.  Post intervention, there is a 0% residual stenosis.  A stent was successfully placed.   1. Successful Stenting of the distal RCA/PDA with DES x 2.   Plan: DAPT for one year. Risk factor modification.     NST 11/11/2018There was no ST segment deviation noted during stress.  Findings consistent with prior large lateral/inferolateral/inferior/inferoapical infarction with minimal peri-infarct ischemia. There is a small basal septal infarct  This is a high risk study. High risk based on low LVEF and large scar. There is minimal myocardium currently at jeopardy. Consider correlating LVEF with echo.  Nuclear stress EF: 30%.      ASSESSMENT:    1. Coronary artery disease involving native coronary artery of native heart without angina pectoris   2. Cardiomyopathy, ischemic   3. Chronic combined systolic and diastolic CHF (congestive heart failure) ([place])   4. Essential hypertension      PLAN:  In order of problems listed above:  CAD status post DES to the circumflex and RCA/PDA 2017 staged procedure, NST 03/2017 large scar with minimal peri-infarct ischemia.  Chest pain and October MI ruled out with negative enzymes and EKG.  No further chest pain since then.  Felt to be stress related.  Follow-up with Dr. Bronson [name] in January as scheduled.  Ischemic cardiomyopathy ejection fraction improved to 40 to 45% on echo 12/20/2017 with grade 1 DD.  Entresto stopped because of hypotension and started on losartan last office visit.  Patient actually felt better on Entresto but is  happy her blood pressures is not so low.  Chronic combined systolic and diastolic CHF-she manages it with extra Lasix as needed.  No heart failure on exam today.  Essential hypertension patient's blood pressures been running low but improved off Entresto.  Hyperlipidemia on Lipitor and Zetia scheduled for blood work Architectural technologist.    Medication Adjustments/Labs and Tests Ordered: Current medicines are reviewed at length with the patient today.  Concerns regarding medicines are outlined above.  Medication changes, Labs and Tests ordered today are listed in the Patient Instructions below. There are no Patient Instructions on file for this visit.   Sumner Boast, PA-C  04/02/2018 2:39 PM    [facility] [place], [place], [place]  [address] Phone: [phone]; Fax: [phone]

## 2018-04-03 ENCOUNTER — Other Ambulatory Visit: Payer: Self-pay | Admitting: Cardiovascular Disease

## 2018-04-06 ENCOUNTER — Other Ambulatory Visit: Payer: Self-pay | Admitting: Cardiovascular Disease

## 2018-04-09 ENCOUNTER — Encounter: Payer: Self-pay | Admitting: Physician Assistant

## 2018-04-09 ENCOUNTER — Ambulatory Visit (INDEPENDENT_AMBULATORY_CARE_PROVIDER_SITE_OTHER): Payer: Managed Care, Other (non HMO) | Admitting: Physician Assistant

## 2018-04-09 VITALS — BP 108/68 | HR 103 | Ht 60.0 in | Wt 128.0 lb

## 2018-04-09 DIAGNOSIS — I10 Essential (primary) hypertension: Secondary | ICD-10-CM

## 2018-04-09 DIAGNOSIS — I2510 Atherosclerotic heart disease of native coronary artery without angina pectoris: Secondary | ICD-10-CM | POA: Diagnosis not present

## 2018-04-09 DIAGNOSIS — I255 Ischemic cardiomyopathy: Secondary | ICD-10-CM | POA: Diagnosis not present

## 2018-04-09 DIAGNOSIS — I5042 Chronic combined systolic (congestive) and diastolic (congestive) heart failure: Secondary | ICD-10-CM

## 2018-04-09 DIAGNOSIS — E782 Mixed hyperlipidemia: Secondary | ICD-10-CM

## 2018-04-09 NOTE — Patient Instructions (Signed)
Your physician recommends that you schedule a follow-up appointment in: [place]  Your physician recommends that you continue on your current medications as directed. Please refer to the Current Medication list given to you today.  Thank you for choosing [facility]!!

## 2018-04-10 ENCOUNTER — Other Ambulatory Visit (HOSPITAL_COMMUNITY)
Admission: RE | Admit: 2018-04-10 | Discharge: 2018-04-10 | Disposition: A | Discharge status: Home / Self Care | Payer: Managed Care, Other (non HMO) | Source: Ambulatory Visit | Attending: Student | Admitting: Student

## 2018-04-10 DIAGNOSIS — I5042 Chronic combined systolic (congestive) and diastolic (congestive) heart failure: Secondary | ICD-10-CM | POA: Diagnosis not present

## 2018-04-10 LAB — BASIC METABOLIC PANEL
Anion gap: 5 (ref 5–15)
BUN: 14 mg/dL (ref 6–20)
CO2: 30 mmol/L (ref 22–32)
Calcium: 9.1 mg/dL (ref 8.9–10.3)
Chloride: 107 mmol/L (ref 98–111)
Creatinine, Ser: 0.79 mg/dL (ref 0.44–1.00)
GFR calc Af Amer: >60 mL/min (ref >60)
GFR calc non Af Amer: >60 mL/min (ref >60)
Glucose, Bld: 107 mg/dL — ABNORMAL HIGH (ref 70–99)
Potassium: 3.9 mmol/L (ref 3.5–5.1)
Sodium: 142 mmol/L (ref 135–145)

## 2018-04-11 ENCOUNTER — Telehealth: Payer: Self-pay | Admitting: Cardiovascular Disease

## 2018-04-11 MED ORDER — POTASSIUM CHLORIDE CRYS ER 20 MEQ PO TBCR: 20.0000 meq | EXTENDED_RELEASE_TABLET | Freq: Every day | ORAL | 3 refills | Status: DC

## 2018-04-11 NOTE — Telephone Encounter (Signed)
Patient is asking if she stop taking Lasix until her Potassium is sent to pharmacy/tg

## 2018-04-11 NOTE — Telephone Encounter (Signed)
Called pt. No answer, left message for pt to return call. Sent In RX to pharmacy.

## 2018-04-11 NOTE — Telephone Encounter (Signed)
Outpatient Medication Detail    Disp Refills Start End   potassium chloride SA (KLOR-CON M20) 20 MEQ tablet 90 tablet 3 04/11/2018    Sig - Route: Take 1 tablet (20 mEq total) by mouth daily. - Oral   Sent to pharmacy as: potassium chloride SA (KLOR-CON M20) 20 MEQ tablet   Notes to Pharmacy: Please consider 90 day supplies to promote better adherence   E-Prescribing Status: Sent to pharmacy (04/11/2018 2:47 PM EST)   Pharmacy   [facility], [place]

## 2018-04-13 ENCOUNTER — Emergency Department (HOSPITAL_COMMUNITY)
Admission: EM | Admit: 2018-04-13 | Discharge: 2018-04-13 | Disposition: A | Discharge status: Home / Self Care | Payer: Managed Care, Other (non HMO) | Attending: Emergency Medicine | Admitting: Emergency Medicine

## 2018-04-13 ENCOUNTER — Encounter (HOSPITAL_COMMUNITY): Payer: Self-pay

## 2018-04-13 ENCOUNTER — Other Ambulatory Visit: Payer: Self-pay

## 2018-04-13 DIAGNOSIS — R51 Headache: Secondary | ICD-10-CM | POA: Insufficient documentation

## 2018-04-13 DIAGNOSIS — I5042 Chronic combined systolic (congestive) and diastolic (congestive) heart failure: Secondary | ICD-10-CM | POA: Diagnosis not present

## 2018-04-13 DIAGNOSIS — I2510 Atherosclerotic heart disease of native coronary artery without angina pectoris: Secondary | ICD-10-CM | POA: Insufficient documentation

## 2018-04-13 DIAGNOSIS — F17210 Nicotine dependence, cigarettes, uncomplicated: Secondary | ICD-10-CM | POA: Insufficient documentation

## 2018-04-13 DIAGNOSIS — Z79899 Other long term (current) drug therapy: Secondary | ICD-10-CM | POA: Diagnosis not present

## 2018-04-13 DIAGNOSIS — I110 Hypertensive heart disease with heart failure: Secondary | ICD-10-CM | POA: Insufficient documentation

## 2018-04-13 DIAGNOSIS — R519 Headache, unspecified: Secondary | ICD-10-CM

## 2018-04-13 DIAGNOSIS — Z7982 Long term (current) use of aspirin: Secondary | ICD-10-CM | POA: Insufficient documentation

## 2018-04-13 LAB — COMPREHENSIVE METABOLIC PANEL
ALT: 22 U/L (ref 0–44)
AST: 29 U/L (ref 15–41)
Albumin: 3.8 g/dL (ref 3.5–5.0)
Alkaline Phosphatase: 63 U/L (ref 38–126)
Anion gap: 7 (ref 5–15)
BUN: 15 mg/dL (ref 6–20)
CO2: 26 mmol/L (ref 22–32)
Calcium: 8.9 mg/dL (ref 8.9–10.3)
Chloride: 105 mmol/L (ref 98–111)
Creatinine, Ser: 0.83 mg/dL (ref 0.44–1.00)
GFR calc Af Amer: >60 mL/min (ref >60)
GFR calc non Af Amer: >60 mL/min (ref >60)
Glucose, Bld: 88 mg/dL (ref 70–99)
Potassium: 4 mmol/L (ref 3.5–5.1)
Sodium: 138 mmol/L (ref 135–145)
Total Bilirubin: 0.5 mg/dL (ref 0.3–1.2)
Total Protein: 6.9 g/dL (ref 6.5–8.1)

## 2018-04-13 LAB — CBC WITH DIFFERENTIAL/PLATELET
Abs Immature Granulocytes: 0.04 10*3/uL (ref 0.00–0.07)
Basophils Absolute: 0.1 10*3/uL (ref 0.0–0.1)
Basophils Relative: 1 %
Eosinophils Absolute: 0.1 10*3/uL (ref 0.0–0.5)
Eosinophils Relative: 1 %
HCT: 35.3 % — ABNORMAL LOW (ref 36.0–46.0)
Hemoglobin: 11 g/dL — ABNORMAL LOW (ref 12.0–15.0)
Immature Granulocytes: 1 %
Lymphocytes Relative: 33 %
Lymphs Abs: 2.7 10*3/uL (ref 0.7–4.0)
MCH: 27.3 pg (ref 26.0–34.0)
MCHC: 31.2 g/dL (ref 30.0–36.0)
MCV: 87.6 fL (ref 80.0–100.0)
Monocytes Absolute: 0.7 10*3/uL (ref 0.1–1.0)
Monocytes Relative: 9 %
Neutro Abs: 4.4 10*3/uL (ref 1.7–7.7)
Neutrophils Relative %: 55 %
Platelets: 367 10*3/uL (ref 150–400)
RBC: 4.03 MIL/uL (ref 3.87–5.11)
RDW: 15.6 % — ABNORMAL HIGH (ref 11.5–15.5)
WBC: 8 10*3/uL (ref 4.0–10.5)
nRBC: 0 % (ref 0.0–0.2)

## 2018-04-13 MED ORDER — DIPHENHYDRAMINE HCL 50 MG/ML IJ SOLN
25.0000 mg | Freq: Once | INTRAMUSCULAR | Status: AC
  Administered 2018-04-13: 25 mg via INTRAVENOUS
  Filled 2018-04-13: qty 1

## 2018-04-13 MED ORDER — METOCLOPRAMIDE HCL 5 MG/ML IJ SOLN
10.0000 mg | Freq: Once | INTRAMUSCULAR | Status: AC
  Administered 2018-04-13: 10 mg via INTRAVENOUS
  Filled 2018-04-13: qty 2

## 2018-04-13 MED ORDER — SODIUM CHLORIDE 0.9 % IV BOLUS
1000.0000 mL | Freq: Once | INTRAVENOUS | Status: AC
  Administered 2018-04-13: 1000 mL via INTRAVENOUS

## 2018-04-13 MED ORDER — KETOROLAC TROMETHAMINE 30 MG/ML IJ SOLN
30.0000 mg | Freq: Once | INTRAMUSCULAR | Status: AC
  Administered 2018-04-13: 30 mg via INTRAVENOUS
  Filled 2018-04-13: qty 1

## 2018-04-13 MED ORDER — TRAMADOL HCL 50 MG PO TABS: ORAL_TABLET | ORAL | 0 refills | Status: DC

## 2018-04-13 NOTE — ED Provider Notes (Signed)
[facility] EMERGENCY DEPARTMENT Provider Note   CSN: [id number] Arrival date & time: 04/13/18  1149     History   Chief Complaint Chief Complaint  Patient presents with  . Near Syncope    HPI Erin [name] is a 57 y.o. female.  Patient complains of a bad headache and dizziness.  She hurts in her right forehead  The history is provided by the patient. No language interpreter was used.  Headache   This is a new problem. The current episode started 6 to 12 hours ago. The problem occurs constantly. The problem has not changed since onset.The headache is associated with nothing. The pain is located in the right unilateral region. The pain is at a severity of 7/10. The pain is moderate. The pain does not radiate. Pertinent negatives include no anorexia. She has tried nothing for the symptoms. The treatment provided no relief.    Past Medical History:  Diagnosis Date  . Anxiety state 03/25/2016  . Basal cell carcinoma of forehead   . CHF (congestive heart failure) ([place])   . Coronary artery disease    a. 03/11/16 PCI with DES-->Prox/Mid Cx;  b. 03/14/16 PCI with DES x2-->RCA, EF 30-35%.  . Essential hypertension   . GERD (gastroesophageal reflux disease)   . HFrEF (heart failure with reduced ejection fraction) ([place])    a. 10/2016 Echo: EF 35-40%, Gr1 DD, mild focal basal septal hypertrophy, basal inflat, mid inflat, basal antlat AK. Mid infept/inf/antlat, apical lateral sev HK. Mod MR. mildly reduced RV fxn. Mild TR.  [name] Kitchen History of pneumonia   . Hyperlipidemia   . Ischemic cardiomyopathy    a. 10/2016 Echo: EF 35-40%, Gr1 DD.  [name] Kitchen Mitral regurgitation   . NSTEMI (non-ST elevated myocardial infarction) ([place]) 03/10/2016  . Squamous cell cancer of skin of nose   . Thrombocytosis ([place]) 03/26/2016  . Tobacco abuse   . Trichimoniasis   . Wears dentures   . Wears glasses     Patient Active Problem List   Diagnosis Date Noted  . Dysphagia 02/27/2018  . Encounter for screening  colonoscopy 02/27/2018  . History of Clostridium difficile infection 02/27/2018  . Frequent stools 12/20/2017  . Chronic combined systolic and diastolic CHF (congestive heart failure) ([place]) 06/20/2016  . Hypokalemia   . Acute CHF (congestive heart failure) ([place]) 05/16/2016  . Acute on chronic systolic CHF (congestive heart failure) ([place]) 05/16/2016  . Acute respiratory failure with hypoxia ([place])   . Thrombocytosis ([place]) 03/26/2016  . Cardiomyopathy, ischemic 03/25/2016  . Chronic combined systolic and diastolic heart failure ([place]) 03/25/2016  . Anxiety state 03/25/2016  . Troponin level elevated 03/25/2016  . CAD (coronary artery disease), native coronary artery 03/25/2016  . Normocytic anemia 03/25/2016  . SOB (shortness of breath) 03/24/2016  . Lightheadedness 03/17/2016  . Hypotension 03/17/2016  . Tobacco abuse 03/12/2016  . NSTEMI (non-ST elevated myocardial infarction) ([place]) 03/11/2016  . Pain in the chest   . Essential hypertension 09/06/2015  . Mixed hyperlipidemia 09/06/2015  . GERD (gastroesophageal reflux disease) 09/06/2015  . Chest pain 09/06/2015    Past Surgical History:  Procedure Laterality Date  . APPENDECTOMY    . CARDIAC CATHETERIZATION N/A 03/11/2016   Procedure: Left Heart Cath and Coronary Angiography;  Surgeon: Leonie Man, MD;  Location: [facility];  Service: Cardiovascular;  Laterality: N/A;  . CARDIAC CATHETERIZATION N/A 03/11/2016   Procedure: Coronary Stent Intervention;  Surgeon: Leonie Man, MD;  Location: [facility];  Service:  Cardiovascular;  Laterality: N/A;  . CARDIAC CATHETERIZATION N/A 03/14/2016   Procedure: Coronary Stent Intervention;  Surgeon: Peter M Martinique, MD;  Location: [facility];  Service: Cardiovascular;  Laterality: N/A;  . CHOLECYSTECTOMY OPEN  1984  . CORONARY ANGIOPLASTY WITH STENT PLACEMENT  03/14/2016  . FINGER ARTHROPLASTY Left 05/14/2013   Procedure: LEFT THUMB CARPAL METACARPAL ARTHROPLASTY;   Surgeon: Tennis Must, MD;  Location: [facility];  Service: Orthopedics;  Laterality: Left;  . IR ANGIO INTRA EXTRACRAN SEL COM CAROTID INNOMINATE BILAT MOD SED  01/05/2017  . IR ANGIO VERTEBRAL SEL VERTEBRAL BILAT MOD SED  01/05/2017  . IR RADIOLOGIST EVAL & MGMT  12/30/2016  . TUBAL LIGATION  1987  . VAGINAL HYSTERECTOMY  2009     OB History    Gravida  4   Para  4   Term  4   Preterm      AB      Living  4     SAB      TAB      Ectopic      Multiple      Live Births               Home Medications    Prior to Admission medications   Medication Sig Start Date End Date Taking? Authorizing Provider  acetaminophen (TYLENOL) 325 MG tablet Take by mouth every 6 (six) hours as needed for mild pain or headache.    [provider]  ALPRAZolam Duanne Moron) 1 MG tablet Take 0.5-1 mg by mouth at bedtime as needed for anxiety or sleep.     [provider]  aspirin EC 81 MG tablet Take 81 mg by mouth every evening.     [provider]  atorvastatin (LIPITOR) 80 MG tablet Take 1 tablet (80 mg total) by mouth daily. 09/01/17   Herminio Commons, MD  atorvastatin (LIPITOR) 80 MG tablet TAKE 1 TABLET BY MOUTH ONCE DAILY AT  6  IN  THE  EVENING 04/03/18   Herminio Commons, MD  ezetimibe (ZETIA) 10 MG tablet Take 1 tablet (10 mg total) by mouth daily. 07/14/17   Herminio Commons, MD  furosemide (LASIX) 20 MG tablet Take 1 tablet (20 mg total) by mouth daily. 03/19/18   Herminio Commons, MD  isosorbide mononitrate (IMDUR) 30 MG 24 hr tablet Take 30 mg by mouth daily.    [provider]  LORazepam (ATIVAN) 1 MG tablet Take 0.5 mg by mouth daily.  11/09/17   [provider]  losartan (COZAAR) 25 MG tablet Take 0.5 tablets (12.5 mg total) by mouth daily. 02/21/18 05/22/18  Herminio Commons, MD  metoprolol succinate (TOPROL XL) 25 MG 24 hr tablet Take 0.5 tablets (12.5 mg total) by mouth daily. 11/17/17   Sherwood Gambler,  MD  Na Sulfate-K Sulfate-Mg Sulf 17.5-3.13-1.6 GM/177ML SOLN Take 1 kit by mouth as directed. 02/27/18   Rourk, Cristopher Estimable, MD  nicotine (NICODERM CQ - DOSED IN MG/24 HOURS) 21 mg/24hr patch Place 21 mg onto the skin daily.    [provider]  nitroGLYCERIN (NITROSTAT) 0.4 MG SL tablet Place 1 tablet (0.4 mg total) under the tongue every 5 (five) minutes x 3 doses as needed for chest pain. 03/05/18   Manuella Ghazi, Pratik D, DO  potassium chloride SA (KLOR-CON M20) 20 MEQ tablet Take 1 tablet (20 mEq total) by mouth daily. 04/11/18   Herminio Commons, MD  rOPINIRole (REQUIP) 0.5 MG  tablet Take 0.5 mg by mouth at bedtime.  11/13/17   [provider]  traMADol Veatrice Bourbon) 50 MG tablet Take 1 every 6 hours as needed for severe headache 04/13/18   Milton Ferguson, MD    Family History Family History  Problem Relation Age of Onset  . Diabetes Father   . Hypertension Father   . CAD Father   . Colon polyps Father 60       pre-cancerous   . Stroke Mother   . Hypertension Mother   . Diabetes Mother   . Heart failure Other   . Breast cancer Maternal Grandmother   . Colon cancer Neg Hx     Social History Social History   Tobacco Use  . Smoking status: Current Some Day Smoker    Packs/day: 0.10    Years: 15.00    Pack years: 1.50    Types: Cigarettes  . Smokeless tobacco: Never Used  . Tobacco comment: smokes a cigarette occasionally  Substance Use Topics  . Alcohol use: Yes    Alcohol/week: 6.0 standard drinks    Types: 6 Cans of beer per week    Comment: three times a week, will have glass of wine and beer   . Drug use: Not Currently    Types: Marijuana    Comment: former     Allergies   Tape   Review of Systems Review of Systems  Constitutional: Negative for appetite change and fatigue.  HENT: Negative for congestion, ear discharge and sinus pressure.   Eyes: Negative for discharge.  Respiratory: Negative for cough.   Cardiovascular: Negative for chest pain.    Gastrointestinal: Negative for abdominal pain, anorexia and diarrhea.  Genitourinary: Negative for frequency and hematuria.  Musculoskeletal: Negative for back pain.  Skin: Negative for rash.  Neurological: Positive for headaches. Negative for seizures.  Psychiatric/Behavioral: Negative for hallucinations.     Physical Exam Updated Vital Signs BP 128/79 (BP Location: Right Arm)   Pulse 89   Temp 97.7 F (36.5 C) (Oral)   Resp 18   SpO2 100%   Physical Exam  Constitutional: She is oriented to person, place, and time. She appears well-developed.  HENT:  Head: Normocephalic.  Eyes: Conjunctivae and EOM are normal. No scleral icterus.  Neck: Neck supple. No thyromegaly present.  Cardiovascular: Normal rate and regular rhythm. Exam reveals no gallop and no friction rub.  No murmur heard. Pulmonary/Chest: No stridor. She has no wheezes. She has no rales. She exhibits no tenderness.  Abdominal: She exhibits no distension. There is no tenderness. There is no rebound.  Musculoskeletal: Normal range of motion. She exhibits no edema.  Lymphadenopathy:    She has no cervical adenopathy.  Neurological: She is oriented to person, place, and time. She exhibits normal muscle tone. Coordination normal.  Skin: No rash noted. No erythema.  Psychiatric: She has a normal mood and affect. Her behavior is normal.     ED Treatments / Results  Labs (all labs ordered are listed, but only abnormal results are displayed) Labs Reviewed  CBC WITH DIFFERENTIAL/PLATELET - Abnormal; Notable for the following components:      Result Value   Hemoglobin 11.0 (*)    HCT 35.3 (*)    RDW 15.6 (*)    All other components within normal limits  COMPREHENSIVE METABOLIC PANEL    EKG EKG Interpretation  Date/Time:  Friday April 13 2018 12:03:18 EST Ventricular Rate:  83 PR Interval:  128 QRS Duration: 66 QT Interval:  374  QTC Calculation: 439 R Axis:   13 Text Interpretation:  Normal sinus rhythm  Normal ECG Confirmed by Milton Ferguson [phone]) on 04/13/2018 4:10:59 PM   Radiology No results found.  Procedures Procedures (including critical care time)  Medications Ordered in ED Medications  sodium chloride 0.9 % bolus 1,000 mL (1,000 mLs Intravenous New Bag/Given 04/13/18 1455)  ketorolac (TORADOL) 30 MG/ML injection 30 mg (30 mg Intravenous Given 04/13/18 1458)  metoCLOPramide (REGLAN) injection 10 mg (10 mg Intravenous Given 04/13/18 1456)  diphenhydrAMINE (BENADRYL) injection 25 mg (25 mg Intravenous Given 04/13/18 1455)     Initial Impression / Assessment and Plan / ED Course  I have reviewed the triage vital signs and the nursing notes.  Pertinent labs & imaging results that were available during my care of the patient were reviewed by me and considered in my medical decision making (see chart for details).     Labs unremarkable.  Headache improved with migraine cocktail.  Patient will be discharged home  Final Clinical Impressions(s) / ED Diagnoses   Final diagnoses:  Headache above the eye region    ED Discharge Orders         Ordered    traMADol (ULTRAM) 50 MG tablet     04/13/18 1619           Milton Ferguson, MD 04/13/18 1625

## 2018-04-13 NOTE — ED Notes (Signed)
Pt reports she was out of K tabs until yesterday   Missed 4 ? doses

## 2018-04-13 NOTE — ED Triage Notes (Signed)
Pt was at work and got lightheaded. For 10-15 seconds pt states she couldn't see, but did not fully blackout. Pt is ambulatory to triage and in NAD. States this happened to her a year ago as well. Is seen by a neurologist and states she has an appointment in January.

## 2018-04-13 NOTE — Discharge Instructions (Addendum)
Follow-up with your doctor if any problems 

## 2018-04-24 ENCOUNTER — Telehealth: Payer: Self-pay | Admitting: Cardiovascular Disease

## 2018-04-24 NOTE — Telephone Encounter (Signed)
Erin [name] nurse case manager w/ [employer] called wanting to know if there are open gaps or areas where she can assist the pt. She has spoken w/ the pt and she's doing good, Erin [name] is going to close her case but wanted to check first.   [phone] ext [phone]

## 2018-05-07 ENCOUNTER — Telehealth: Payer: Self-pay | Admitting: Cardiovascular Disease

## 2018-05-07 NOTE — Telephone Encounter (Signed)
Patient states that she is having pain in her feet and they are turning purple. States they are not swelling though. Wants to know if this could be caused from CHF. Also requested sooner appointment than 1/16 but there isn't anything sooner. / tg

## 2018-05-07 NOTE — Telephone Encounter (Signed)
Called pt, no answer. Left msg to call back

## 2018-05-08 NOTE — Telephone Encounter (Signed)
Called pt. No answer. Left message for pt to return call.  

## 2018-05-10 ENCOUNTER — Emergency Department (HOSPITAL_COMMUNITY)
Admission: EM | Admit: 2018-05-10 | Discharge: 2018-05-10 | Disposition: A | Discharge status: Home / Self Care | Payer: Managed Care, Other (non HMO) | Attending: Emergency Medicine | Admitting: Emergency Medicine

## 2018-05-10 ENCOUNTER — Encounter (HOSPITAL_COMMUNITY): Payer: Self-pay | Admitting: Emergency Medicine

## 2018-05-10 ENCOUNTER — Other Ambulatory Visit: Payer: Self-pay

## 2018-05-10 DIAGNOSIS — I5042 Chronic combined systolic (congestive) and diastolic (congestive) heart failure: Secondary | ICD-10-CM | POA: Diagnosis not present

## 2018-05-10 DIAGNOSIS — F17210 Nicotine dependence, cigarettes, uncomplicated: Secondary | ICD-10-CM | POA: Diagnosis not present

## 2018-05-10 DIAGNOSIS — M79604 Pain in right leg: Secondary | ICD-10-CM | POA: Insufficient documentation

## 2018-05-10 DIAGNOSIS — M79605 Pain in left leg: Secondary | ICD-10-CM | POA: Diagnosis present

## 2018-05-10 DIAGNOSIS — Z79899 Other long term (current) drug therapy: Secondary | ICD-10-CM | POA: Diagnosis not present

## 2018-05-10 DIAGNOSIS — I2510 Atherosclerotic heart disease of native coronary artery without angina pectoris: Secondary | ICD-10-CM | POA: Diagnosis not present

## 2018-05-10 DIAGNOSIS — I110 Hypertensive heart disease with heart failure: Secondary | ICD-10-CM | POA: Insufficient documentation

## 2018-05-10 DIAGNOSIS — Z7982 Long term (current) use of aspirin: Secondary | ICD-10-CM | POA: Diagnosis not present

## 2018-05-10 MED ORDER — GABAPENTIN 100 MG PO CAPS: 100.0000 mg | ORAL_CAPSULE | Freq: Three times a day (TID) | ORAL | 0 refills | Status: DC

## 2018-05-10 NOTE — ED Provider Notes (Signed)
[facility] EMERGENCY DEPARTMENT Provider Note   CSN: [id number] Arrival date & time: 05/10/18  1229     History   Chief Complaint Chief Complaint  Patient presents with  . Leg Pain    HPI Erin [name] is a 57 y.o. female with a past medical history of CHF, CAD, GERD who presents today for evaluation of pain in her legs and her feet.  She reports that for a month and a half the bottoms of her feet are being poked with needles and the top of her feet and legs feel like on fire.  She reports that she has been unable to stand in the shower due to the pain of the water hitting her legs.    She denies any new leg swelling.  She denies any injury.  She denies any changes to bowel or bladder function.  No numbness or tingling across upper inner thighs or genitals.  No recent fevers.  No personal history of IV drug use.  HPI  Past Medical History:  Diagnosis Date  . Anxiety state 03/25/2016  . Basal cell carcinoma of forehead   . CHF (congestive heart failure) ([facility])   . Coronary artery disease    a. 03/11/16 PCI with DES-->Prox/Mid Cx;  b. 03/14/16 PCI with DES x2-->RCA, EF 30-35%.  . Essential hypertension   . GERD (gastroesophageal reflux disease)   . HFrEF (heart failure with reduced ejection fraction) ([facility])    a. 10/2016 Echo: EF 35-40%, Gr1 DD, mild focal basal septal hypertrophy, basal inflat, mid inflat, basal antlat AK. Mid infept/inf/antlat, apical lateral sev HK. Mod MR. mildly reduced RV fxn. Mild TR.  . History of pneumonia   . Hyperlipidemia   . Ischemic cardiomyopathy    a. 10/2016 Echo: EF 35-40%, Gr1 DD.  . Mitral regurgitation   . NSTEMI (non-ST elevated myocardial infarction) ([facility]) 03/10/2016  . Squamous cell cancer of skin of nose   . Thrombocytosis ([facility]) 03/26/2016  . Tobacco abuse   . Trichimoniasis   . Wears dentures   . Wears glasses     Patient Active Problem List   Diagnosis Date Noted  . Dysphagia 02/27/2018  . Encounter for screening colonoscopy  02/27/2018  . History of Clostridium difficile infection 02/27/2018  . Frequent stools 12/20/2017  . Chronic combined systolic and diastolic CHF (congestive heart failure) ([facility]) 06/20/2016  . Hypokalemia   . Acute CHF (congestive heart failure) ([facility]) 05/16/2016  . Acute on chronic systolic CHF (congestive heart failure) ([facility]) 05/16/2016  . Acute respiratory failure with hypoxia ([facility])   . Thrombocytosis ([facility]) 03/26/2016  . Cardiomyopathy, ischemic 03/25/2016  . Chronic combined systolic and diastolic heart failure ([facility]) 03/25/2016  . Anxiety state 03/25/2016  . Troponin level elevated 03/25/2016  . CAD (coronary artery disease), native coronary artery 03/25/2016  . Normocytic anemia 03/25/2016  . SOB (shortness of breath) 03/24/2016  . Lightheadedness 03/17/2016  . Hypotension 03/17/2016  . Tobacco abuse 03/12/2016  . NSTEMI (non-ST elevated myocardial infarction) ([facility]) 03/11/2016  . Pain in the chest   . Essential hypertension 09/06/2015  . Mixed hyperlipidemia 09/06/2015  . GERD (gastroesophageal reflux disease) 09/06/2015  . Chest pain 09/06/2015    Past Surgical History:  Procedure Laterality Date  . APPENDECTOMY    . CARDIAC CATHETERIZATION N/A 03/11/2016   Procedure: Left Heart Cath and Coronary Angiography;  Surgeon: David W Harding, MD;  Location: [facility];  Service: Cardiovascular;  Laterality: N/A;  . CARDIAC CATHETERIZATION N/A 03/11/2016     Procedure: Coronary Stent Intervention;  Surgeon: Leonie Man, MD;  Location: [facility];  Service: Cardiovascular;  Laterality: N/A;  . CARDIAC CATHETERIZATION N/A 03/14/2016   Procedure: Coronary Stent Intervention;  Surgeon: Peter M Martinique, MD;  Location: [facility];  Service: Cardiovascular;  Laterality: N/A;  . CHOLECYSTECTOMY OPEN  1984  . CORONARY ANGIOPLASTY WITH STENT PLACEMENT  03/14/2016  . FINGER ARTHROPLASTY Left 05/14/2013   Procedure: LEFT THUMB CARPAL METACARPAL ARTHROPLASTY;  Surgeon: Tennis Must, MD;  Location: [facility];  Service: Orthopedics;  Laterality: Left;  . IR ANGIO INTRA EXTRACRAN SEL COM CAROTID INNOMINATE BILAT MOD SED  01/05/2017  . IR ANGIO VERTEBRAL SEL VERTEBRAL BILAT MOD SED  01/05/2017  . IR RADIOLOGIST EVAL & MGMT  12/30/2016  . TUBAL LIGATION  1987  . VAGINAL HYSTERECTOMY  2009     OB History    Gravida  4   Para  4   Term  4   Preterm      AB      Living  4     SAB      TAB      Ectopic      Multiple      Live Births               Home Medications    Prior to Admission medications   Medication Sig Start Date End Date Taking? Authorizing Provider  acetaminophen (TYLENOL) 325 MG tablet Take by mouth every 6 (six) hours as needed for mild pain or headache.    [provider]  ALPRAZolam Duanne Moron) 1 MG tablet Take 0.5-1 mg by mouth at bedtime as needed for anxiety or sleep.     [provider]  aspirin EC 81 MG tablet Take 81 mg by mouth every evening.     [provider]  atorvastatin (LIPITOR) 80 MG tablet Take 1 tablet (80 mg total) by mouth daily. 09/01/17   Herminio Commons, MD  atorvastatin (LIPITOR) 80 MG tablet TAKE 1 TABLET BY MOUTH ONCE DAILY AT  6  IN  THE  EVENING 04/03/18   Herminio Commons, MD  ezetimibe (ZETIA) 10 MG tablet Take 1 tablet (10 mg total) by mouth daily. 07/14/17   Herminio Commons, MD  furosemide (LASIX) 20 MG tablet Take 1 tablet (20 mg total) by mouth daily. 03/19/18   Herminio Commons, MD  gabapentin (NEURONTIN) 100 MG capsule Take 1 capsule (100 mg total) by mouth 3 (three) times daily for 7 days. 05/10/18 05/17/18  Lorin Glass, PA-C  isosorbide mononitrate (IMDUR) 30 MG 24 hr tablet Take 30 mg by mouth daily.    [provider]  LORazepam (ATIVAN) 1 MG tablet Take 0.5 mg by mouth daily.  11/09/17   [provider]  losartan (COZAAR) 25 MG tablet Take 0.5 tablets (12.5 mg total) by mouth daily. 02/21/18 05/22/18  Herminio Commons, MD  metoprolol succinate (TOPROL XL) 25 MG 24 hr tablet Take 0.5 tablets (12.5 mg total) by mouth daily. 11/17/17   Sherwood Gambler, MD  Na Sulfate-K Sulfate-Mg Sulf 17.5-3.13-1.6 GM/177ML SOLN Take 1 kit by mouth as directed. 02/27/18   Rourk, Cristopher Estimable, MD  nicotine (NICODERM CQ - DOSED IN MG/24 HOURS) 21 mg/24hr patch Place 21 mg onto the skin daily.    [provider]  nitroGLYCERIN (NITROSTAT) 0.4 MG SL tablet Place 1 tablet (0.4 mg total) under the tongue every 5 (five)  minutes x 3 doses as needed for chest pain. 03/05/18   Shah, Pratik D, DO  potassium chloride SA (KLOR-CON M20) 20 MEQ tablet Take 1 tablet (20 mEq total) by mouth daily. 04/11/18   Koneswaran, Suresh A, MD  rOPINIRole (REQUIP) 0.5 MG tablet Take 0.5 mg by mouth at bedtime.  11/13/17   [provider]  traMADol (ULTRAM) 50 MG tablet Take 1 every 6 hours as needed for severe headache 04/13/18   Zammit, Joseph, MD    Family History Family History  Problem Relation Age of Onset  . Diabetes Father   . Hypertension Father   . CAD Father   . Colon polyps Father 60       pre-cancerous   . Stroke Mother   . Hypertension Mother   . Diabetes Mother   . Heart failure Other   . Breast cancer Maternal Grandmother   . Colon cancer Neg Hx     Social History Social History   Tobacco Use  . Smoking status: Current Some Day Smoker    Packs/day: 0.10    Years: 15.00    Pack years: 1.50    Types: Cigarettes  . Smokeless tobacco: Never Used  . Tobacco comment: smokes a cigarette occasionally  Substance Use Topics  . Alcohol use: Yes    Alcohol/week: 6.0 standard drinks    Types: 6 Cans of beer per week    Comment: occasionally  . Drug use: Not Currently    Types: Marijuana    Comment: former     Allergies   Tape   Review of Systems Review of Systems  Constitutional: Negative for chills and fever.  Neurological: Negative for weakness.       Tingling in bilateral legs/feet.   All other  systems reviewed and are negative.    Physical Exam Updated Vital Signs BP 136/76 (BP Location: Right Arm)   Pulse 85   Temp 97.9 F (36.6 C) (Oral)   Resp 18   Ht 5' (1.524 m)   Wt 59 kg   SpO2 100%   BMI 25.39 kg/m   Physical Exam Vitals signs and nursing note reviewed.  Constitutional:      General: She is not in acute distress.    Appearance: She is not ill-appearing.  HENT:     Head: Normocephalic.  Cardiovascular:     Rate and Rhythm: Normal rate.     Comments: 2+ DP/PT pulses bilaterally.  Capillary refill is under 2 seconds on bilateral great toes. Pulmonary:     Effort: Pulmonary effort is normal. No respiratory distress.  Musculoskeletal:     Comments: No edema or pitting edema to bilateral lower extremities.  Skin:    Comments: No abnormal redness, swelling, wounds to bilateral lower extremities.  Skin on the dorsal aspect of bilateral feet appears dry.  Neurological:     Mental Status: She is alert.     Comments: Sensation intact to bilateral lower extremities.      ED Treatments / Results  Labs (all labs ordered are listed, but only abnormal results are displayed) Labs Reviewed - No data to display  EKG None  Radiology No results found.  Procedures Procedures (including critical care time)  Medications Ordered in ED Medications - No data to display   Initial Impression / Assessment and Plan / ED Course  I have reviewed the triage vital signs and the nursing notes.  Pertinent labs & imaging results that were available during my care of the patient   were reviewed by me and considered in my medical decision making (see chart for details).    Patient presents today for evaluation of in her bilateral legs and feet for approximately a month and a half without any specific injury.  She says that her pain is like burning, tingling and feeling like she is being poked with needles.  Physical exam is unremarkable.  Suspect neuropathy.  She has  previously been on gabapentin for nerve pain in her wrist, and tolerated that well.  She request that we trial her on gabapentin.  She is given a prescription for a weeks worth of low-dose gabapentin.  Return precautions were discussed with patient who states their understanding.  At the time of discharge patient denied any unaddressed complaints or concerns.  Patient is agreeable for discharge home.   Final Clinical Impressions(s) / ED Diagnoses   Final diagnoses:  Bilateral leg pain    ED Discharge Orders         Ordered    gabapentin (NEURONTIN) 100 MG capsule  3 times daily     05/10/18 1424           Lorin Glass, [place] 05/10/18 1520    Orlie Dakin, MD 05/10/18 1609

## 2018-05-10 NOTE — ED Triage Notes (Addendum)
Patient complaining of bilateral leg pain from knee down to feet for over a month. Denies injury or swelling. States she saw PCP for same and was given requip with no relief.

## 2018-05-10 NOTE — ED Notes (Signed)
Patient reports bilateral lower leg pain for approximately a month. Patient states the pain is intermittent and sometimes accompanied by tingling in her feet. She denies back injury or any problems with her back. Seen by PCP and given Requip which she reports has not alleviated her symptoms, prescribed a month and a half ago.

## 2018-05-10 NOTE — Discharge Instructions (Addendum)
Please take Tylenol (acetaminophen) to relieve your pain.  You may take tylenol, up to 1,000 mg (two extra strength pills).  Do not take more than 3,000 mg tylenol in a 24 hour period.  Please check all medication labels as many medications such as pain and cold medications may contain tylenol. Please do not drink alcohol while taking this medication.  ° °You are being prescribed a medication which may make you sleepy. For 24 hours after one dose please do not drive, operate heavy machinery, care for a small child with out another adult present, or perform any activities that may cause harm to you or someone else if you were to fall asleep or be impaired.  ° °

## 2018-05-16 ENCOUNTER — Telehealth: Payer: Self-pay | Admitting: Cardiovascular Disease

## 2018-05-16 MED ORDER — FUROSEMIDE 20 MG PO TABS: 20.0000 mg | ORAL_TABLET | Freq: Every day | ORAL | 3 refills | Status: DC

## 2018-05-16 NOTE — Telephone Encounter (Signed)
Refill on Furosemide sent to [facility] / tg

## 2018-05-16 NOTE — Telephone Encounter (Signed)
Sent!

## 2018-05-16 NOTE — Telephone Encounter (Signed)
Mailed pt a letter to contact us

## 2018-05-21 ENCOUNTER — Other Ambulatory Visit (HOSPITAL_COMMUNITY): Payer: Self-pay | Admitting: Interventional Radiology

## 2018-05-21 DIAGNOSIS — I771 Stricture of artery: Secondary | ICD-10-CM

## 2018-05-24 ENCOUNTER — Ambulatory Visit (INDEPENDENT_AMBULATORY_CARE_PROVIDER_SITE_OTHER): Payer: Managed Care, Other (non HMO) | Admitting: Cardiovascular Disease

## 2018-05-24 ENCOUNTER — Encounter: Payer: Self-pay | Admitting: Cardiovascular Disease

## 2018-05-24 VITALS — BP 126/80 | HR 78 | Ht 60.0 in | Wt 134.0 lb

## 2018-05-24 DIAGNOSIS — M79604 Pain in right leg: Secondary | ICD-10-CM

## 2018-05-24 DIAGNOSIS — I25118 Atherosclerotic heart disease of native coronary artery with other forms of angina pectoris: Secondary | ICD-10-CM | POA: Diagnosis not present

## 2018-05-24 DIAGNOSIS — M79605 Pain in left leg: Secondary | ICD-10-CM

## 2018-05-24 DIAGNOSIS — I5042 Chronic combined systolic (congestive) and diastolic (congestive) heart failure: Secondary | ICD-10-CM

## 2018-05-24 DIAGNOSIS — I10 Essential (primary) hypertension: Secondary | ICD-10-CM

## 2018-05-24 DIAGNOSIS — E785 Hyperlipidemia, unspecified: Secondary | ICD-10-CM | POA: Diagnosis not present

## 2018-05-24 DIAGNOSIS — M79671 Pain in right foot: Secondary | ICD-10-CM

## 2018-05-24 DIAGNOSIS — M79672 Pain in left foot: Secondary | ICD-10-CM

## 2018-05-24 MED ORDER — SACUBITRIL-VALSARTAN 24-26 MG PO TABS: 1.0000 | ORAL_TABLET | Freq: Two times a day (BID) | ORAL | 3 refills | Status: DC

## 2018-05-24 NOTE — Patient Instructions (Signed)
Medication Instructions:  STOP LOSARTAN  START ENTRESTO 24/26 MG TWO TIMES DAILY   Labwork: NONE  Testing/Procedures: NONE  Follow-Up: Your physician recommends that you schedule a follow-up appointment in: 3 MONTHS    Any Other Special Instructions Will Be Listed Below (If Applicable).     If you need a refill on your cardiac medications before your next appointment, please call your pharmacy.  AV

## 2018-05-24 NOTE — Progress Notes (Signed)
    SUBJECTIVE: The patient presents for routine follow-up.  She was evaluated for bilateral leg and feet pain and burning and was told in the ED she had a neuropathy on 05/10/2018.  She has a past medical history of CAD (s/p DES to LCx with staged PCI/DESx2 to RCA/PDA in 03/2016, NST in 03/2017 showing large scar with minimal peri-infarct ischemia), chronic combined systolic and diastolic CHF, HTN, and HLD.  Echocardiogram 12/20/2017 showed a slight improvement in LVEF now up to 40 to 45% with wall motion abnormalities, grade 1 diastolic dysfunction, and mild mitral and tricuspid regurgitation.  There is a small circumferential pericardial effusion.  She continues to have bilateral leg and feet burning pains and was recently started on gabapentin.  She asked if she can go back on Entresto and says "I felt 100% better on Entresto and I did not even feel like of had a heart attack ".  She denies exertional chest pain.  She also denies worsening of chronic exertional dyspnea.  She continues to smoke.      Review of Systems: As per "subjective", otherwise negative.  Allergies  Allergen Reactions  . Tape Other (See Comments)    PEELS SKIN OFF  (PAPER TAPE IS FINE)    Current Outpatient Medications  Medication Sig Dispense Refill  . acetaminophen (TYLENOL) 325 MG tablet Take by mouth every 6 (six) hours as needed for mild pain or headache.    . ALPRAZolam (XANAX) 1 MG tablet Take 0.5-1 mg by mouth at bedtime as needed for anxiety or sleep.     . aspirin EC 81 MG tablet Take 81 mg by mouth every evening.     . atorvastatin (LIPITOR) 80 MG tablet Take 1 tablet (80 mg total) by mouth daily. 90 tablet 1  . atorvastatin (LIPITOR) 80 MG tablet TAKE 1 TABLET BY MOUTH ONCE DAILY AT  6  IN  THE  EVENING 90 tablet 3  . ezetimibe (ZETIA) 10 MG tablet Take 1 tablet (10 mg total) by mouth daily. 30 tablet 11  . furosemide (LASIX) 20 MG tablet Take 1 tablet (20 mg total) by mouth daily. 90 tablet 3    . isosorbide mononitrate (IMDUR) 30 MG 24 hr tablet Take 30 mg by mouth daily.    . LORazepam (ATIVAN) 1 MG tablet Take 0.5 mg by mouth daily.   1  . metoprolol succinate (TOPROL XL) 25 MG 24 hr tablet Take 0.5 tablets (12.5 mg total) by mouth daily. 30 tablet 0  . Na Sulfate-K Sulfate-Mg Sulf 17.5-3.13-1.6 GM/177ML SOLN Take 1 kit by mouth as directed. 1 Bottle 0  . nicotine (NICODERM CQ - DOSED IN MG/24 HOURS) 21 mg/24hr patch Place 21 mg onto the skin daily.    . nitroGLYCERIN (NITROSTAT) 0.4 MG SL tablet Place 1 tablet (0.4 mg total) under the tongue every 5 (five) minutes x 3 doses as needed for chest pain. 30 tablet 12  . potassium chloride SA (KLOR-CON M20) 20 MEQ tablet Take 1 tablet (20 mEq total) by mouth daily. 90 tablet 3  . rOPINIRole (REQUIP) 0.5 MG tablet Take 0.5 mg by mouth at bedtime.   0  . traMADol (ULTRAM) 50 MG tablet Take 1 every 6 hours as needed for severe headache 10 tablet 0  . gabapentin (NEURONTIN) 100 MG capsule Take 1 capsule (100 mg total) by mouth 3 (three) times daily for 7 days. 21 capsule 0  . losartan (COZAAR) 25 MG tablet Take 0.5 tablets (12.5 mg total)   by mouth daily. 45 tablet 3   No current facility-administered medications for this visit.     Past Medical History:  Diagnosis Date  . Anxiety state 03/25/2016  . Basal cell carcinoma of forehead   . CHF (congestive heart failure) (HCC)   . Coronary artery disease    a. 03/11/16 PCI with DES-->Prox/Mid Cx;  b. 03/14/16 PCI with DES x2-->RCA, EF 30-35%.  . Essential hypertension   . GERD (gastroesophageal reflux disease)   . HFrEF (heart failure with reduced ejection fraction) (HCC)    a. 10/2016 Echo: EF 35-40%, Gr1 DD, mild focal basal septal hypertrophy, basal inflat, mid inflat, basal antlat AK. Mid infept/inf/antlat, apical lateral sev HK. Mod MR. mildly reduced RV fxn. Mild TR.  . History of pneumonia   . Hyperlipidemia   . Ischemic cardiomyopathy    a. 10/2016 Echo: EF 35-40%, Gr1 DD.  . Mitral  regurgitation   . NSTEMI (non-ST elevated myocardial infarction) (HCC) 03/10/2016  . Squamous cell cancer of skin of nose   . Thrombocytosis (HCC) 03/26/2016  . Tobacco abuse   . Trichimoniasis   . Wears dentures   . Wears glasses     Past Surgical History:  Procedure Laterality Date  . APPENDECTOMY    . CARDIAC CATHETERIZATION N/A 03/11/2016   Procedure: Left Heart Cath and Coronary Angiography;  Surgeon: David W Harding, MD;  [facility]: [facility];  Service: Cardiovascular;  Laterality: N/A;  . CARDIAC CATHETERIZATION N/A 03/11/2016   Procedure: Coronary Stent Intervention;  Surgeon: David W Harding, MD;  Location: [facility];  Service: Cardiovascular;  Laterality: N/A;  . CARDIAC CATHETERIZATION N/A 03/14/2016   Procedure: Coronary Stent Intervention;  Surgeon: Peter M Jordan, MD;  Location: [facility];  Service: Cardiovascular;  Laterality: N/A;  . CHOLECYSTECTOMY OPEN  1984  . CORONARY ANGIOPLASTY WITH STENT PLACEMENT  03/14/2016  . FINGER ARTHROPLASTY Left 05/14/2013   Procedure: LEFT THUMB CARPAL METACARPAL ARTHROPLASTY;  Surgeon: Kevin R Kuzma, MD;  Location: [facility];  Service: Orthopedics;  Laterality: Left;  . IR ANGIO INTRA EXTRACRAN SEL COM CAROTID INNOMINATE BILAT MOD SED  01/05/2017  . IR ANGIO VERTEBRAL SEL VERTEBRAL BILAT MOD SED  01/05/2017  . IR RADIOLOGIST EVAL & MGMT  12/30/2016  . TUBAL LIGATION  1987  . VAGINAL HYSTERECTOMY  2009    Social History   Socioeconomic History  . Marital status: Married    Spouse name: Not on file  . Number of children: Not on file  . Years of education: Not on file  . Highest education level: Not on file  Occupational History  . Occupation: CNA  Social Needs  . Financial resource strain: Not on file  . Food insecurity:    Worry: Not on file    Inability: Not on file  . Transportation needs:    Medical: Not on file    Non-medical: Not on file  Tobacco Use  . Smoking status: Current Some  Day Smoker    Packs/day: 0.10    Years: 15.00    Pack years: 1.50    Types: Cigarettes  . Smokeless tobacco: Never Used  . Tobacco comment: smokes a cigarette occasionally  Substance and Sexual Activity  . Alcohol use: Yes    Alcohol/week: 6.0 standard drinks    Types: 6 Cans of beer per week    Comment: occasionally  . Drug use: Not Currently    Types: Marijuana    Comment: former  .   Sexual activity: Not Currently    Birth control/protection: Surgical    Comment: hyst  Lifestyle  . Physical activity:    Days per week: Not on file    Minutes per session: Not on file  . Stress: Not on file  Relationships  . Social connections:    Talks on phone: Not on file    Gets together: Not on file    Attends religious service: Not on file    Active member of club or organization: Not on file    Attends meetings of clubs or organizations: Not on file    Relationship status: Not on file  . Intimate partner violence:    Fear of current or ex partner: Not on file    Emotionally abused: Not on file    Physically abused: Not on file    Forced sexual activity: Not on file  Other Topics Concern  . Not on file  Social History Narrative   Lives with husband in [place] in a one story home with a basement.  Has 4 children.  Works as a Quarry manager.  Education: CNA school.      Vitals:   05/24/18 1012  BP: 126/80  Pulse: 78  SpO2: 99%  Weight: 134 lb (60.8 kg)  Height: 5' (1.524 m)    Wt Readings from Last 3 Encounters:  05/24/18 134 lb (60.8 kg)  05/10/18 130 lb (59 kg)  04/09/18 128 lb (58.1 kg)     PHYSICAL EXAM General: NAD HEENT: Normal. Neck: No JVD, no thyromegaly. Lungs: Clear to auscultation bilaterally with normal respiratory effort. CV: Regular rate and rhythm, normal S1/S2, no S3/S4, no murmur. No pretibial or periankle edema.     Abdomen: Soft, nontender, no distention.  Neurologic: Alert and oriented.  Psych: Normal affect. Skin: Normal. Musculoskeletal: No gross  deformities.    ECG: Reviewed above under Subjective   Labs: Lab Results  Component Value Date/Time   K 4.0 04/13/2018 03:00 PM   BUN 15 04/13/2018 03:00 PM   BUN 12 07/20/2016 01:27 PM   CREATININE 0.83 04/13/2018 03:00 PM   CREATININE 0.73 05/13/2016 10:20 AM   ALT 22 04/13/2018 03:00 PM   TSH 0.60 06/26/2017 11:28 AM   HGB 11.0 (L) 04/13/2018 03:00 PM     Lipids: Lab Results  Component Value Date/Time   LDLCALC 81 03/19/2017 06:43 AM   CHOL 179 03/19/2017 06:43 AM   TRIG 245 (H) 03/19/2017 06:43 AM   HDL 49 03/19/2017 06:43 AM       ASSESSMENT AND PLAN:  1. Coronary artery disease: Symptomatically stable overall. Continue aspirin, atorvastatin, Zetia, Toprol-XL 12.5 mg daily, and Imdur 30 mg.  2. Hypertension: Blood pressure is normal.  I will monitor given switch from losartan to Entresto (see discussion and #4).  3. Hyperlipidemia: LDL 81 in November 2018. Continue Lipitor 80 mg and Zetia.  4. Cardiomyopathy/chronic combined systolic and diastolic heart failure: Currently on Toprol-XL 12.5 mg daily, losartan 12.5 mg daily, and Lasix 20 mg daily with supplemental potassium.  LVEF 40 to 45%.  She request to be switched back to [facility] as she felt significantly better on this.  I will discontinue losartan and resume Entresto 24-26 mg twice daily.  She has been instructed to take an extra 20 mg of Lasix for weight increase of 3 pounds in 24 hours.  5.  Bilateral leg and feet pain: Recently started on gabapentin for neuropathy.   Disposition: Follow up 3 months   Kate Sable, M.D.,  F.A.C.C. 

## 2018-05-29 ENCOUNTER — Telehealth (HOSPITAL_COMMUNITY): Payer: Self-pay

## 2018-05-29 ENCOUNTER — Ambulatory Visit (HOSPITAL_COMMUNITY)
Admission: RE | Admit: 2018-05-29 | Discharge: 2018-05-29 | Disposition: A | Discharge status: Home / Self Care | Payer: Managed Care, Other (non HMO) | Source: Ambulatory Visit | Attending: Interventional Radiology | Admitting: Interventional Radiology

## 2018-05-29 DIAGNOSIS — I771 Stricture of artery: Secondary | ICD-10-CM

## 2018-05-29 DIAGNOSIS — I6523 Occlusion and stenosis of bilateral carotid arteries: Secondary | ICD-10-CM | POA: Diagnosis not present

## 2018-05-29 NOTE — Telephone Encounter (Signed)
Pt agreed to f/u in 6 months with us carotid. AW 

## 2018-06-06 ENCOUNTER — Encounter: Payer: Self-pay | Admitting: Gastroenterology

## 2018-06-06 ENCOUNTER — Encounter: Payer: Self-pay | Admitting: *Deleted

## 2018-06-06 ENCOUNTER — Ambulatory Visit (INDEPENDENT_AMBULATORY_CARE_PROVIDER_SITE_OTHER): Payer: Managed Care, Other (non HMO) | Admitting: Gastroenterology

## 2018-06-06 ENCOUNTER — Telehealth: Payer: Self-pay | Admitting: Gastroenterology

## 2018-06-06 ENCOUNTER — Other Ambulatory Visit: Payer: Self-pay | Admitting: *Deleted

## 2018-06-06 VITALS — BP 104/74 | HR 91 | Temp 97.5°F | Ht 60.0 in | Wt 133.8 lb

## 2018-06-06 DIAGNOSIS — Z1211 Encounter for screening for malignant neoplasm of colon: Secondary | ICD-10-CM | POA: Diagnosis not present

## 2018-06-06 DIAGNOSIS — R1310 Dysphagia, unspecified: Secondary | ICD-10-CM

## 2018-06-06 DIAGNOSIS — D649 Anemia, unspecified: Secondary | ICD-10-CM

## 2018-06-06 MED ORDER — PEG 3350-KCL-NA BICARB-NACL 420 G PO SOLR: 4000.0000 mL | Freq: Once | ORAL | 0 refills | Status: AC

## 2018-06-06 MED ORDER — PANTOPRAZOLE SODIUM 40 MG PO TBEC: 40.0000 mg | DELAYED_RELEASE_TABLET | Freq: Every day | ORAL | 3 refills | Status: DC

## 2018-06-06 NOTE — Patient Instructions (Addendum)
We have scheduled a colonoscopy, upper endoscopy, and dilation in the near future with Dr. Gala Romney.  I would like for you to stop omeprazole. Instead, I have sent Protonix to the pharmacy to take once each morning, 30 minutes before breakfast. Let me know how this works for you.  We will see you in 4 months.  Further recommendations to follow!   I enjoyed seeing you again today! As you know, I value our relationship and want to provide genuine, compassionate, and quality care. I welcome your feedback. If you receive a survey regarding your visit,  I greatly appreciate you taking time to fill this out. See you next time!  Annitta Needs, PhD, ANP-BC [facility]

## 2018-06-06 NOTE — Assessment & Plan Note (Signed)
Chronic dysphagia, vague solid food dysphagia. Worsening GERD noted despite being on omeprazole. Will change to Protonix once daily. Although she does have a history of CDI, symptomatically is requiring PPI for symptom relief. Will pursue EGD/dilatation at time of colonoscopy.  Proceed with upper endoscopy/dilatation in the near future with Dr. Gala Romney. The risks, benefits, and alternatives have been discussed in detail with patient. They have stated understanding and desire to proceed.  Propofol due to polypharmacy Protonix once each morning. Stop omeprazole Return in 4 months

## 2018-06-06 NOTE — Assessment & Plan Note (Signed)
Noted recently. Will check iron studies.

## 2018-06-06 NOTE — Assessment & Plan Note (Signed)
58 year old female with need for initial screening colonoscopy. Father with pre-cancerous polyps age 56, but no family history of colorectal cancer. Treated for uncomplicated CDI Aug 7169 with vancomycin and has been back to baseline since that time.  Proceed with TCS with Dr. Gala Romney in near future: the risks, benefits, and alternatives have been discussed with the patient in detail. The patient states understanding and desires to proceed. Propofol due to polypharmacy.

## 2018-06-06 NOTE — Telephone Encounter (Signed)
Patient with normocytic anemia. Please have her complete CBC, iron, TIBC, ferritin at her convenience. Labs printed.

## 2018-06-06 NOTE — Progress Notes (Signed)
Referring Provider: [name], MD Primary Care Physician:  [name], MD  Primary GI: Dr. Gala Romney   Chief Complaint  Patient presents with  . Colonoscopy    reschedule tcs  . Dysphagia    when drinks water, feels like it's stabbing    HPI:   Erin [name] is a 58 y.o. female presenting today with a history of CDI in Aug 2019 s/p Vanc for 14 days. Needs EGD/dilatation due to chronic dysphagia. No prior EGD or colonoscopy. Will need initial screening colonoscopy as well.   When eating or drinking, has pain in esophagus. Vague solid food dysphagia. Chronic reflux. Takes a lot of Tums. Taking omeprazole but still with lots of heartburn. Trying to stay away from spicy foods. Has been on omeprazole chronically.   No constipation or diarrhea. No overt GI bleeding.   Past Medical History:  Diagnosis Date  . Anxiety state 03/25/2016  . Basal cell carcinoma of forehead   . CHF (congestive heart failure) ([place])   . Coronary artery disease    a. 03/11/16 PCI with DES-->Prox/Mid Cx;  b. 03/14/16 PCI with DES x2-->RCA, EF 30-35%.  . Essential hypertension   . GERD (gastroesophageal reflux disease)   . HFrEF (heart failure with reduced ejection fraction) ([place])    a. 10/2016 Echo: EF 35-40%, Gr1 DD, mild focal basal septal hypertrophy, basal inflat, mid inflat, basal antlat AK. Mid infept/inf/antlat, apical lateral sev HK. Mod MR. mildly reduced RV fxn. Mild TR.  [name] Kitchen History of pneumonia   . Hyperlipidemia   . Ischemic cardiomyopathy    a. 10/2016 Echo: EF 35-40%, Gr1 DD.  [name] Kitchen Mitral regurgitation   . NSTEMI (non-ST elevated myocardial infarction) ([place]) 03/10/2016  . Squamous cell cancer of skin of nose   . Thrombocytosis ([place]) 03/26/2016  . Tobacco abuse   . Trichimoniasis   . Wears dentures   . Wears glasses     Past Surgical History:  Procedure Laterality Date  . APPENDECTOMY    . CARDIAC CATHETERIZATION N/A 03/11/2016   Procedure: Left Heart Cath and Coronary Angiography;  Surgeon:  Leonie Man, MD;  Location: [facility];  Service: Cardiovascular;  Laterality: N/A;  . CARDIAC CATHETERIZATION N/A 03/11/2016   Procedure: Coronary Stent Intervention;  Surgeon: Leonie Man, MD;  Location: [facility] [place] [facility];  Service: Cardiovascular;  Laterality: N/A;  . CARDIAC CATHETERIZATION N/A 03/14/2016   Procedure: Coronary Stent Intervention;  Surgeon: Peter M Martinique, MD;  Location: [facility];  Service: Cardiovascular;  Laterality: N/A;  . CHOLECYSTECTOMY OPEN  1984  . CORONARY ANGIOPLASTY WITH STENT PLACEMENT  03/14/2016  . FINGER ARTHROPLASTY Left 05/14/2013   Procedure: LEFT THUMB CARPAL METACARPAL ARTHROPLASTY;  Surgeon: Tennis Must, MD;  Location: [facility];  Service: Orthopedics;  Laterality: Left;  . IR ANGIO INTRA EXTRACRAN SEL COM CAROTID INNOMINATE BILAT MOD SED  01/05/2017  . IR ANGIO VERTEBRAL SEL VERTEBRAL BILAT MOD SED  01/05/2017  . IR RADIOLOGIST EVAL & MGMT  12/30/2016  . TUBAL LIGATION  1987  . VAGINAL HYSTERECTOMY  2009    Current Outpatient Medications  Medication Sig Dispense Refill  . acetaminophen (TYLENOL) 325 MG tablet Take by mouth every 6 (six) hours as needed for mild pain or headache.    . ALPRAZolam (XANAX) 1 MG tablet Take 0.5-1 mg by mouth at bedtime as needed for anxiety or sleep.     [name] Kitchen aspirin EC 81 MG tablet Take 81 mg by  mouth every evening.     [name] Kitchen atorvastatin (LIPITOR) 80 MG tablet Take 1 tablet (80 mg total) by mouth daily. 90 tablet 1  . atorvastatin (LIPITOR) 80 MG tablet TAKE 1 TABLET BY MOUTH ONCE DAILY AT  6  IN  THE  EVENING 90 tablet 3  . ezetimibe (ZETIA) 10 MG tablet Take 1 tablet (10 mg total) by mouth daily. 30 tablet 11  . furosemide (LASIX) 20 MG tablet Take 1 tablet (20 mg total) by mouth daily. 90 tablet 3  . gabapentin (NEURONTIN) 100 MG capsule Take 1 capsule (100 mg total) by mouth 3 (three) times daily for 7 days. 21 capsule 0  . isosorbide mononitrate (IMDUR) 30 MG 24 hr tablet Take 30 mg  by mouth daily.    [name] Kitchen LORazepam (ATIVAN) 1 MG tablet Take 0.5 mg by mouth daily.   1  . metoprolol succinate (TOPROL XL) 25 MG 24 hr tablet Take 0.5 tablets (12.5 mg total) by mouth daily. 30 tablet 0  . nicotine (NICODERM CQ - DOSED IN MG/24 HOURS) 21 mg/24hr patch Place 21 mg onto the skin daily.    . nitroGLYCERIN (NITROSTAT) 0.4 MG SL tablet Place 1 tablet (0.4 mg total) under the tongue every 5 (five) minutes x 3 doses as needed for chest pain. 30 tablet 12  . omeprazole (PRILOSEC) 20 MG capsule Take 20 mg by mouth daily.    . potassium chloride SA (KLOR-CON M20) 20 MEQ tablet Take 1 tablet (20 mEq total) by mouth daily. 90 tablet 3  . rOPINIRole (REQUIP) 0.5 MG tablet Take 0.5 mg by mouth at bedtime.   0  . sacubitril-valsartan (ENTRESTO) 24-26 MG Take 1 tablet by mouth 2 (two) times daily. 60 tablet 3  . pantoprazole (PROTONIX) 40 MG tablet Take 1 tablet (40 mg total) by mouth daily. Take 30 minutes prior to breakfast 90 tablet 3  . polyethylene glycol-electrolytes (NULYTELY/GOLYTELY) 420 g solution Take 4,000 mLs by mouth once for 1 dose. 4000 mL 0   No current facility-administered medications for this visit.     Allergies as of 06/06/2018 - Review Complete 06/06/2018  Allergen Reaction Noted  . Tape Other (See Comments) 01/03/2017    Family History  Problem Relation Age of Onset  . Diabetes Father   . Hypertension Father   . CAD Father   . Colon polyps Father 60       pre-cancerous   . Stroke Mother   . Hypertension Mother   . Diabetes Mother   . Heart failure Other   . Breast cancer Maternal Grandmother   . Colon cancer Neg Hx     Social History   Socioeconomic History  . Marital status: Married    Spouse name: Not on file  . Number of children: Not on file  . Years of education: Not on file  . Highest education level: Not on file  Occupational History  . Occupation: CNA  Social Needs  . Financial resource strain: Not on file  . Food insecurity:    Worry: Not  on file    Inability: Not on file  . Transportation needs:    Medical: Not on file    Non-medical: Not on file  Tobacco Use  . Smoking status: Current Some Day Smoker    Packs/day: 0.10    Years: 15.00    Pack years: 1.50    Types: Cigarettes  . Smokeless tobacco: Never Used  . Tobacco comment: smokes a cigarette occasionally  Substance and  Sexual Activity  . Alcohol use: Yes    Comment: occasionally  . Drug use: Not Currently    Types: Marijuana    Comment: former  . Sexual activity: Not Currently    Birth control/protection: Surgical    Comment: hyst  Lifestyle  . Physical activity:    Days per week: Not on file    Minutes per session: Not on file  . Stress: Not on file  Relationships  . Social connections:    Talks on phone: Not on file    Gets together: Not on file    Attends religious service: Not on file    Active member of club or organization: Not on file    Attends meetings of clubs or organizations: Not on file    Relationship status: Not on file  Other Topics Concern  . Not on file  Social History Narrative   Lives with husband in [place] in a one story home with a basement.  Has 4 children.  Works as a Quarry manager.  Education: CNA school.     Review of Systems: Gen: Denies fever, chills, anorexia. Denies fatigue, weakness, weight loss.  CV: Denies chest pain, palpitations, syncope, peripheral edema, and claudication. Resp: Denies dyspnea at rest, cough, wheezing, coughing up blood, and pleurisy. GI: see HPI Derm: Denies rash, itching, dry skin Psych: Denies depression, anxiety, memory loss, confusion. No homicidal or suicidal ideation.  Heme: Denies bruising, bleeding, and enlarged lymph nodes.  Physical Exam: BP 104/74   Pulse 91   Temp (!) 97.5 F (36.4 C) (Oral)   Ht 5' (1.524 m)   Wt 133 lb 12.8 oz (60.7 kg)   BMI 26.13 kg/m  General:   Alert and oriented. No distress noted. Pleasant and cooperative.  Head:  Normocephalic and atraumatic. Eyes:   Conjuctiva clear without scleral icterus. Mouth:  Oral mucosa pink and moist.  Lungs: clear to auscultation bilaterally Cardiac: S1 S2 present without murmurs  Abdomen:  +BS, soft, non-tender and non-distended. No rebound or guarding. No HSM or masses noted. Msk:  Symmetrical without gross deformities. Normal posture. Extremities:  Without edema. Neurologic:  Alert and  oriented x4 Psych:  Alert and cooperative. Normal mood and affect.  Lab Results  Component Value Date   CREATININE 0.83 04/13/2018   BUN 15 04/13/2018   NA 138 04/13/2018   K 4.0 04/13/2018   CL 105 04/13/2018   CO2 26 04/13/2018   Lab Results  Component Value Date   ALT 22 04/13/2018   AST 29 04/13/2018   ALKPHOS 63 04/13/2018   BILITOT 0.5 04/13/2018   Lab Results  Component Value Date   WBC 8.0 04/13/2018   HGB 11.0 (L) 04/13/2018   HCT 35.3 (L) 04/13/2018   MCV 87.6 04/13/2018   PLT 367 04/13/2018

## 2018-06-07 ENCOUNTER — Telehealth: Payer: Self-pay | Admitting: *Deleted

## 2018-06-07 NOTE — Telephone Encounter (Signed)
Pre-op scheduled for 08/10/2018 at 10:00am. Letter mailed. Called and was advised she was at work.

## 2018-06-07 NOTE — Progress Notes (Signed)
CC'ED TO PCP 

## 2018-06-07 NOTE — Telephone Encounter (Signed)
Noted. Lmom, waiting on pt to return call.

## 2018-06-07 NOTE — Telephone Encounter (Signed)
Spoke with pts daughter. Placing labs in the mail.

## 2018-06-27 ENCOUNTER — Other Ambulatory Visit (HOSPITAL_COMMUNITY)
Admission: RE | Admit: 2018-06-27 | Discharge: 2018-06-27 | Disposition: A | Discharge status: Home / Self Care | Payer: Managed Care, Other (non HMO) | Source: Ambulatory Visit | Attending: Gastroenterology | Admitting: Gastroenterology

## 2018-06-27 DIAGNOSIS — D649 Anemia, unspecified: Secondary | ICD-10-CM | POA: Diagnosis not present

## 2018-06-27 LAB — IRON AND TIBC
Iron: 76 ug/dL (ref 28–170)
Saturation Ratios: 17 % (ref 10.4–31.8)
TIBC: 436 ug/dL (ref 250–450)
UIBC: 360 ug/dL

## 2018-06-27 LAB — FERRITIN: Ferritin: 38 ng/mL (ref 11–307)

## 2018-07-10 NOTE — Progress Notes (Signed)
Previously noted normocytic anemia, with Hgb 11. In Dec 2019. Updated iron studies with normal iron at 76, sats 17%, ferritin on the lower end of 38. She has upcoming TCS/EGD/dilatation. Will await findings from this and keep follow-up for May 2020. Will likely repeat then.

## 2018-07-25 ENCOUNTER — Telehealth: Payer: Self-pay | Admitting: Cardiovascular Disease

## 2018-07-25 NOTE — Telephone Encounter (Addendum)
Called pt. 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 She want's to be sure she does not pass any sickness along nor that she goes in a room unprotected from the COVID 19 virus. I advised her to see her pcp fif she develops any flu like symptoms. She voiced understanding.

## 2018-07-25 NOTE — Telephone Encounter (Signed)
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

## 2018-08-03 ENCOUNTER — Telehealth: Payer: Self-pay | Admitting: Gastroenterology

## 2018-08-03 NOTE — Telephone Encounter (Signed)
STARTED HAVING LOWER ABDOMINAL PAIN THIS AM AFTER GOT TO WORK AT [employer]. HEAVY RECTAL BLEEDING 30 MINS AGO. NO CHEST PAIN. GOT LIGHTHEADED AND HAD SWEATING. HAVING DIARRHEA.  LIFTS AND TURNSS/PULLS ON PATIENTS. TAKES LASIX, HEART PILL, ANOTHER EPISODE WITH BLOOD ON TOILET PAPER.   PT DID NOT WANT TO COME TO ED. DISCUSSED OPTIONS: 1. WAIT BUT DRINK WATER TO KEEP YOUR URINE LIGHT YELLOW AND IF PAIN MORE SEVERE GO TO ED, OR IF RECTAL BLEEDING BECOMES MORE HEAVY. SHE IS SCHEDULED FOR AN EGD/DIL/TCS ON APR 9. PT VOICED HER UNDERSTANDING.  WILL HAVE NURSE CALL ON MON FOR AN UPDATE.

## 2018-08-06 ENCOUNTER — Other Ambulatory Visit: Payer: Self-pay

## 2018-08-06 ENCOUNTER — Telehealth: Payer: Self-pay

## 2018-08-06 NOTE — Telephone Encounter (Signed)
Spoke with pt this morning 08/06/2018 at 8:35 AM. Pt states that she hasn't had anymore rectal bleeding since 3 days ago. She is has c/o water diarrhea which she feels having her procedure(EGD/TCS) on April 9. will explain what's going on with her. As soon as pt eats, she feels nauseated with mild abdominal cramps and has to run to the bathroom to have a bowel movement. Pt states that it doesn't matter what she eats, she still has diarrhea after eating anything. This morning she ate chips to take her medicine and had a bowel movement as soon as she finished taking her medicine.

## 2018-08-06 NOTE — Telephone Encounter (Signed)
Lmom, waiting on a return call.  

## 2018-08-06 NOTE — Telephone Encounter (Signed)
Pt is scheduled for TCS/EGD/DIL w/Propofol w/RMR 08/16/18. TCS will need to be rescheduled d/t COVID-19 restrictions. Tried to call pt, no answer, LMOAM for return call.

## 2018-08-06 NOTE — Telephone Encounter (Signed)
Lets do a Cdiff assay on stool now

## 2018-08-07 ENCOUNTER — Telehealth: Payer: Self-pay

## 2018-08-07 ENCOUNTER — Other Ambulatory Visit: Payer: Self-pay

## 2018-08-07 DIAGNOSIS — R197 Diarrhea, unspecified: Secondary | ICD-10-CM

## 2018-08-07 NOTE — Telephone Encounter (Signed)
Called pt, TCS/EGD/DIL w/Propofol w/RMR that was for 08/16/18 rescheduled to 10/11/18 at 10:45am d/t COVID-19 restrictions (pt wanted to have all procedures done at same time). Endo scheduler informed. Pre-op appt rescheduled to 10/05/18 at 11:00am. Letter mailed with new procedure instructions.

## 2018-08-07 NOTE — Telephone Encounter (Signed)
See other phone note for 08/08/18.

## 2018-08-07 NOTE — Telephone Encounter (Signed)
Called and spoke with pt. She is aware of RMR's recommendations. Orders placed and released for [facility] per pts request.

## 2018-08-08 ENCOUNTER — Other Ambulatory Visit: Payer: Self-pay | Admitting: Student

## 2018-08-08 ENCOUNTER — Other Ambulatory Visit: Payer: Self-pay | Admitting: Cardiovascular Disease

## 2018-08-10 ENCOUNTER — Encounter: Payer: Self-pay | Admitting: Gastroenterology

## 2018-08-10 ENCOUNTER — Inpatient Hospital Stay (HOSPITAL_COMMUNITY): Admission: RE | Admit: 2018-08-10 | Payer: Managed Care, Other (non HMO) | Source: Ambulatory Visit

## 2018-08-12 LAB — CLOSTRIDIUM DIFFICILE EIA: C difficile Toxins A+B, EIA: NEGATIVE

## 2018-08-16 ENCOUNTER — Other Ambulatory Visit: Payer: Self-pay

## 2018-08-16 ENCOUNTER — Emergency Department (HOSPITAL_COMMUNITY): Payer: Managed Care, Other (non HMO)

## 2018-08-16 ENCOUNTER — Encounter (HOSPITAL_COMMUNITY): Payer: Self-pay | Admitting: Emergency Medicine

## 2018-08-16 ENCOUNTER — Emergency Department (HOSPITAL_COMMUNITY)
Admission: EM | Admit: 2018-08-16 | Discharge: 2018-08-16 | Disposition: A | Discharge status: Home / Self Care | Payer: Managed Care, Other (non HMO) | Attending: Emergency Medicine | Admitting: Emergency Medicine

## 2018-08-16 DIAGNOSIS — R0789 Other chest pain: Secondary | ICD-10-CM | POA: Diagnosis not present

## 2018-08-16 DIAGNOSIS — Z85828 Personal history of other malignant neoplasm of skin: Secondary | ICD-10-CM | POA: Insufficient documentation

## 2018-08-16 DIAGNOSIS — I2510 Atherosclerotic heart disease of native coronary artery without angina pectoris: Secondary | ICD-10-CM | POA: Insufficient documentation

## 2018-08-16 DIAGNOSIS — J181 Lobar pneumonia, unspecified organism: Secondary | ICD-10-CM

## 2018-08-16 DIAGNOSIS — I110 Hypertensive heart disease with heart failure: Secondary | ICD-10-CM | POA: Diagnosis not present

## 2018-08-16 DIAGNOSIS — I5042 Chronic combined systolic (congestive) and diastolic (congestive) heart failure: Secondary | ICD-10-CM | POA: Insufficient documentation

## 2018-08-16 DIAGNOSIS — F17210 Nicotine dependence, cigarettes, uncomplicated: Secondary | ICD-10-CM | POA: Diagnosis not present

## 2018-08-16 DIAGNOSIS — J189 Pneumonia, unspecified organism: Secondary | ICD-10-CM

## 2018-08-16 DIAGNOSIS — Z79899 Other long term (current) drug therapy: Secondary | ICD-10-CM | POA: Diagnosis not present

## 2018-08-16 DIAGNOSIS — Z7982 Long term (current) use of aspirin: Secondary | ICD-10-CM | POA: Diagnosis not present

## 2018-08-16 LAB — CBC WITH DIFFERENTIAL/PLATELET
Abs Immature Granulocytes: 0.05 10*3/uL (ref 0.00–0.07)
Basophils Absolute: 0.1 10*3/uL (ref 0.0–0.1)
Basophils Relative: 1 %
Eosinophils Absolute: 0.1 10*3/uL (ref 0.0–0.5)
Eosinophils Relative: 1 %
HCT: 37 % (ref 36.0–46.0)
Hemoglobin: 12.3 g/dL (ref 12.0–15.0)
Immature Granulocytes: 1 %
Lymphocytes Relative: 32 %
Lymphs Abs: 2.8 10*3/uL (ref 0.7–4.0)
MCH: 28.1 pg (ref 26.0–34.0)
MCHC: 33.2 g/dL (ref 30.0–36.0)
MCV: 84.7 fL (ref 80.0–100.0)
Monocytes Absolute: 0.7 10*3/uL (ref 0.1–1.0)
Monocytes Relative: 8 %
Neutro Abs: 5.2 10*3/uL (ref 1.7–7.7)
Neutrophils Relative %: 57 %
Platelets: 420 10*3/uL — ABNORMAL HIGH (ref 150–400)
RBC: 4.37 MIL/uL (ref 3.87–5.11)
RDW: 15.7 % — ABNORMAL HIGH (ref 11.5–15.5)
WBC: 8.8 10*3/uL (ref 4.0–10.5)
nRBC: 0 % (ref 0.0–0.2)

## 2018-08-16 LAB — BASIC METABOLIC PANEL
Anion gap: 11 (ref 5–15)
BUN: 17 mg/dL (ref 6–20)
CO2: 25 mmol/L (ref 22–32)
Calcium: 9.5 mg/dL (ref 8.9–10.3)
Chloride: 98 mmol/L (ref 98–111)
Creatinine, Ser: 1.11 mg/dL — ABNORMAL HIGH (ref 0.44–1.00)
GFR calc Af Amer: >60 mL/min (ref >60)
GFR calc non Af Amer: 55 mL/min — ABNORMAL LOW (ref >60)
Glucose, Bld: 120 mg/dL — ABNORMAL HIGH (ref 70–99)
Potassium: 3.3 mmol/L — ABNORMAL LOW (ref 3.5–5.1)
Sodium: 134 mmol/L — ABNORMAL LOW (ref 135–145)

## 2018-08-16 LAB — BRAIN NATRIURETIC PEPTIDE: B Natriuretic Peptide: 69 pg/mL (ref 0.0–100.0)

## 2018-08-16 LAB — TROPONIN I
Troponin I: <0.03 ng/mL (ref <0.03)
Troponin I: <0.03 ng/mL (ref <0.03)

## 2018-08-16 MED ORDER — SODIUM CHLORIDE 0.9 % IV BOLUS
500.0000 mL | Freq: Once | INTRAVENOUS | Status: AC
  Administered 2018-08-16: 500 mL via INTRAVENOUS

## 2018-08-16 MED ORDER — LEVOFLOXACIN 750 MG PO TABS: 750.0000 mg | ORAL_TABLET | Freq: Every day | ORAL | 0 refills | Status: DC

## 2018-08-16 MED ORDER — IOHEXOL 350 MG/ML SOLN
100.0000 mL | Freq: Once | INTRAVENOUS | Status: AC | PRN
  Administered 2018-08-16: 100 mL via INTRAVENOUS

## 2018-08-16 MED ORDER — LEVOFLOXACIN IN D5W 750 MG/150ML IV SOLN
750.0000 mg | Freq: Once | INTRAVENOUS | Status: AC
  Administered 2018-08-16: 05:00:00 750 mg via INTRAVENOUS
  Filled 2018-08-16: qty 150

## 2018-08-16 MED ORDER — ONDANSETRON HCL 4 MG/2ML IJ SOLN
4.0000 mg | Freq: Once | INTRAMUSCULAR | Status: AC
  Administered 2018-08-16: 4 mg via INTRAVENOUS
  Filled 2018-08-16: qty 2

## 2018-08-16 MED ORDER — ALBUTEROL SULFATE HFA 108 (90 BASE) MCG/ACT IN AERS: 2.0000 | INHALATION_SPRAY | RESPIRATORY_TRACT | 0 refills | Status: DC | PRN

## 2018-08-16 MED ORDER — MORPHINE SULFATE (PF) 2 MG/ML IV SOLN
2.0000 mg | Freq: Once | INTRAVENOUS | Status: AC
  Administered 2018-08-16: 2 mg via INTRAVENOUS
  Filled 2018-08-16: qty 1

## 2018-08-16 NOTE — ED Provider Notes (Signed)
[facility] EMERGENCY DEPARTMENT Provider Note   CSN: [id number] Arrival date & time: 08/16/18  0232    History   Chief Complaint Chief Complaint  Patient presents with   Chest Pain    HPI Erin [name] is a 58 y.o. female.     Patient presents to the emergency department for evaluation of chest pain.  She reports that the pain began 4 hours ago.  Pain has not been related to exertion.  It feels worse if she lies down, improves if she sits up.  She has not had any significant shortness of breath, denies diaphoresis and nausea.  She took 81 mg of aspirin and 2 nitroglycerin at home.  She did not feel any relief of her pain with the nitroglycerin, but felt dizzy like her blood pressure dropped so she did not take the third.  Pain is 9 out of 10 at arrival.  It is in the left chest and goes through into her back.     Past Medical History:  Diagnosis Date   Anxiety state 03/25/2016   Basal cell carcinoma of forehead    CHF (congestive heart failure) ([place])    Coronary artery disease    a. 03/11/16 PCI with DES-->Prox/Mid Cx;  b. 03/14/16 PCI with DES x2-->RCA, EF 30-35%.   Essential hypertension    GERD (gastroesophageal reflux disease)    HFrEF (heart failure with reduced ejection fraction) ([place])    a. 10/2016 Echo: EF 35-40%, Gr1 DD, mild focal basal septal hypertrophy, basal inflat, mid inflat, basal antlat AK. Mid infept/inf/antlat, apical lateral sev HK. Mod MR. mildly reduced RV fxn. Mild TR.   History of pneumonia    Hyperlipidemia    Ischemic cardiomyopathy    a. 10/2016 Echo: EF 35-40%, Gr1 DD.   Mitral regurgitation    NSTEMI (non-ST elevated myocardial infarction) ([place]) 03/10/2016   Squamous cell cancer of skin of nose    Thrombocytosis (HCC) 03/26/2016   Tobacco abuse    Trichimoniasis    Wears dentures    Wears glasses     Patient Active Problem List   Diagnosis Date Noted   Dysphagia 02/27/2018   Encounter for screening colonoscopy  02/27/2018   History of Clostridium difficile infection 02/27/2018   Frequent stools 12/20/2017   Chronic combined systolic and diastolic CHF (congestive heart failure) ([place]) 06/20/2016   Hypokalemia    Acute CHF (congestive heart failure) ([place]) 05/16/2016   Acute on chronic systolic CHF (congestive heart failure) ([place]) 05/16/2016   Acute respiratory failure with hypoxia (HCC)    Thrombocytosis ([place]) 03/26/2016   Cardiomyopathy, ischemic 03/25/2016   Chronic combined systolic and diastolic heart failure ([place]) 03/25/2016   Anxiety state 03/25/2016   Troponin level elevated 03/25/2016   CAD (coronary artery disease), native coronary artery 03/25/2016   Normocytic anemia 03/25/2016   SOB (shortness of breath) 03/24/2016   Lightheadedness 03/17/2016   Hypotension 03/17/2016   Tobacco abuse 03/12/2016   NSTEMI (non-ST elevated myocardial infarction) ([place]) 03/11/2016   Pain in the chest    Essential hypertension 09/06/2015   Mixed hyperlipidemia 09/06/2015   GERD (gastroesophageal reflux disease) 09/06/2015   Chest pain 09/06/2015    Past Surgical History:  Procedure Laterality Date   APPENDECTOMY     CARDIAC CATHETERIZATION N/A 03/11/2016   Procedure: Left Heart Cath and Coronary Angiography;  Surgeon: Leonie Man, MD;  Location: [place] [facility];  Service: Cardiovascular;  Laterality: N/A;   CARDIAC CATHETERIZATION N/A 03/11/2016   Procedure: Coronary  Stent Intervention;  Surgeon: Leonie Man, MD;  Location: [place] [facility];  Service: Cardiovascular;  Laterality: N/A;   CARDIAC CATHETERIZATION N/A 03/14/2016   Procedure: Coronary Stent Intervention;  Surgeon: Peter M Martinique, MD;  Location: [place] [facility];  Service: Cardiovascular;  Laterality: N/A;   CHOLECYSTECTOMY OPEN  1984   CORONARY ANGIOPLASTY WITH STENT PLACEMENT  03/14/2016   FINGER ARTHROPLASTY Left 05/14/2013   Procedure: LEFT THUMB CARPAL METACARPAL ARTHROPLASTY;  Surgeon: Tennis Must, MD;  Location: [place];  Service: Orthopedics;  Laterality: Left;   IR ANGIO INTRA EXTRACRAN SEL COM CAROTID INNOMINATE BILAT MOD SED  01/05/2017   IR ANGIO VERTEBRAL SEL VERTEBRAL BILAT MOD SED  01/05/2017   IR RADIOLOGIST EVAL & MGMT  12/30/2016   TUBAL LIGATION  1987   VAGINAL HYSTERECTOMY  2009     OB History    Gravida  4   Para  4   Term  4   Preterm      AB      Living  4     SAB      TAB      Ectopic      Multiple      Live Births               Home Medications    Prior to Admission medications   Medication Sig Start Date End Date Taking? Authorizing Provider  acetaminophen (TYLENOL) 325 MG tablet Take 325 mg by mouth every 6 (six) hours as needed for mild pain or headache.     [provider]  albuterol (PROVENTIL HFA;VENTOLIN HFA) 108 (90 Base) MCG/ACT inhaler Inhale 2 puffs into the lungs every 4 (four) hours as needed for wheezing or shortness of breath. 08/16/18   Orpah Greek, MD  ALPRAZolam Duanne Moron) 1 MG tablet Take 1 mg by mouth at bedtime.     [provider]  aspirin EC 81 MG tablet Take 81 mg by mouth every evening.     [provider]  atorvastatin (LIPITOR) 80 MG tablet TAKE 1 TABLET BY MOUTH ONCE DAILY AT  6  IN  THE  EVENING Patient taking differently: Take 80 mg by mouth every evening.  04/03/18   Herminio Commons, MD  ezetimibe (ZETIA) 10 MG tablet Take 1 tablet by mouth once daily 08/08/18   Herminio Commons, MD  furosemide (LASIX) 20 MG tablet Take 1 tablet (20 mg total) by mouth daily. 05/16/18   Herminio Commons, MD  gabapentin (NEURONTIN) 100 MG capsule Take 100 mg by mouth 3 (three) times daily.    [provider]  isosorbide mononitrate (IMDUR) 30 MG 24 hr tablet Take 1 tablet by mouth once daily 08/09/18   Herminio Commons, MD  levofloxacin (LEVAQUIN) 750 MG tablet Take 1 tablet (750 mg total) by mouth daily. 08/16/18   Orpah Greek, MD    LORazepam (ATIVAN) 1 MG tablet Take 1 mg by mouth daily as needed for anxiety.  11/09/17   [provider]  metoprolol succinate (TOPROL XL) 25 MG 24 hr tablet Take 0.5 tablets (12.5 mg total) by mouth daily. 11/17/17   Sherwood Gambler, MD  Multiple Vitamins-Minerals (MULTIVITAMIN WITH MINERALS) tablet Take 1 tablet by mouth daily.    [provider]  nicotine (NICODERM CQ - DOSED IN MG/24 HOURS) 21 mg/24hr patch Place 21 mg onto the skin daily.    [provider]  nitroGLYCERIN (NITROSTAT) 0.4 MG  SL tablet Place 1 tablet (0.4 mg total) under the tongue every 5 (five) minutes x 3 doses as needed for chest pain. 03/05/18   Manuella Ghazi, Pratik D, DO  omeprazole (PRILOSEC) 20 MG capsule Take 20 mg by mouth daily.    [provider]  pantoprazole (PROTONIX) 40 MG tablet Take 1 tablet (40 mg total) by mouth daily. Take 30 minutes prior to breakfast 06/06/18   Annitta Needs, NP  potassium chloride SA (KLOR-CON M20) 20 MEQ tablet Take 1 tablet (20 mEq total) by mouth daily. 04/11/18   Herminio Commons, MD  rOPINIRole (REQUIP) 0.5 MG tablet Take 0.5 mg by mouth at bedtime.  11/13/17   [provider]  sacubitril-valsartan (ENTRESTO) 24-26 MG Take 1 tablet by mouth 2 (two) times daily. 05/24/18   Herminio Commons, MD    Family History Family History  Problem Relation Age of Onset   Diabetes Father    Hypertension Father    CAD Father    Colon polyps Father 65       pre-cancerous    Stroke Mother    Hypertension Mother    Diabetes Mother    Heart failure Other    Breast cancer Maternal Grandmother    Colon cancer Neg Hx     Social History Social History   Tobacco Use   Smoking status: Current Some Day Smoker    Packs/day: 0.10    Years: 15.00    Pack years: 1.50    Types: Cigarettes   Smokeless tobacco: Never Used   Tobacco comment: smokes a cigarette occasionally  Substance Use Topics   Alcohol use: Yes    Comment: occasionally    Drug use: Not Currently    Types: Marijuana    Comment: former     Allergies   Tape   Review of Systems Review of Systems  Cardiovascular: Positive for chest pain.  Musculoskeletal: Positive for back pain.  All other systems reviewed and are negative.    Physical Exam Updated Vital Signs BP 97/72    Pulse 86    Temp 97.8 F (36.6 C) (Oral)    Resp 20    Ht 5\' 5"  (1.651 m)    Wt 62.1 kg    SpO2 96%    BMI 22.80 kg/m   Physical Exam Vitals signs and nursing note reviewed.  Constitutional:      General: She is not in acute distress.    Appearance: Normal appearance. She is well-developed.  HENT:     Head: Normocephalic and atraumatic.     Right Ear: Hearing normal.     Left Ear: Hearing normal.     Nose: Nose normal.  Eyes:     Conjunctiva/sclera: Conjunctivae normal.     Pupils: Pupils are equal, round, and reactive to light.  Neck:     Musculoskeletal: Normal range of motion and neck supple.  Cardiovascular:     Rate and Rhythm: Regular rhythm.     Heart sounds: S1 normal and S2 normal. No murmur. No friction rub. No gallop.   Pulmonary:     Effort: Pulmonary effort is normal. No respiratory distress.     Breath sounds: Normal breath sounds.  Chest:     Chest wall: No tenderness.  Abdominal:     General: Bowel sounds are normal.     Palpations: Abdomen is soft.     Tenderness: There is no abdominal tenderness. There is no guarding or rebound. Negative signs include Murphy's sign and  McBurney's sign.     Hernia: No hernia is present.  Musculoskeletal: Normal range of motion.  Skin:    General: Skin is warm and dry.     Findings: No rash.  Neurological:     Mental Status: She is alert and oriented to person, place, and time.     GCS: GCS eye subscore is 4. GCS verbal subscore is 5. GCS motor subscore is 6.     Cranial Nerves: No cranial nerve deficit.     Sensory: No sensory deficit.     Coordination: Coordination normal.  Psychiatric:        Speech: Speech  normal.        Behavior: Behavior normal.        Thought Content: Thought content normal.      ED Treatments / Results  Labs (all labs ordered are listed, but only abnormal results are displayed) Labs Reviewed  CBC WITH DIFFERENTIAL/PLATELET - Abnormal; Notable for the following components:      Result Value   RDW 15.7 (*)    Platelets 420 (*)    All other components within normal limits  BASIC METABOLIC PANEL - Abnormal; Notable for the following components:   Sodium 134 (*)    Potassium 3.3 (*)    Glucose, Bld 120 (*)    Creatinine, Ser 1.11 (*)    GFR calc non Af Amer 55 (*)    All other components within normal limits  TROPONIN I  BRAIN NATRIURETIC PEPTIDE  TROPONIN I    EKG EKG Interpretation  Date/Time:  Thursday August 16 2018 02:41:29 EDT Ventricular Rate:  90 PR Interval:    QRS Duration: 93 QT Interval:  369 QTC Calculation: 452 R Axis:   10 Text Interpretation:  Sinus rhythm Consider right atrial enlargement Low voltage, extremity and precordial leads No significant change since last tracing Confirmed by Orpah Greek ([id number]) on 08/16/2018 3:33:12 AM   Radiology Dg Chest Port 1 View  Result Date: 08/16/2018 CLINICAL DATA:  Chest pain. Cough and shortness of breath. EXAM: PORTABLE CHEST 1 VIEW COMPARISON:  03/04/2018 FINDINGS: The cardiomediastinal contours are normal. The lungs are clear. Pulmonary vasculature is normal. No consolidation, pleural effusion, or pneumothorax. No acute osseous abnormalities are seen. IMPRESSION: No acute chest findings. Electronically Signed   By: Keith Rake M.D.   On: 08/16/2018 03:00   Ct Angio Chest/abd/pel For Dissection W &/or Wo Contrast  Result Date: 08/16/2018 CLINICAL DATA:  Initial evaluation for acute midsternal chest pain radiating to back. EXAM: CT ANGIOGRAPHY CHEST, ABDOMEN AND PELVIS TECHNIQUE: Multidetector CT imaging through the chest, abdomen and pelvis was performed using the standard protocol during  bolus administration of intravenous contrast. Multiplanar reconstructed images and MIPs were obtained and reviewed to evaluate the vascular anatomy. CONTRAST:  164mL OMNIPAQUE IOHEXOL 350 MG/ML SOLN COMPARISON:  Prior radiograph from earlier the same day. FINDINGS: CTA CHEST FINDINGS Cardiovascular: Precontrast imaging through the intrathoracic aorta demonstrates no mural thrombus or other acute finding. Moderate atherosclerotic change. Postcontrast imaging demonstrates no evidence for dissection or other acute finding. Visualized great vessels within normal limits. Heart size normal. Coronary stent noted within the left circumflex. Small pericardial effusion noted. Limited assessment of the pulmonary arterial tree unremarkable. Mediastinum/Nodes: Thyroid within normal limits. No enlarged mediastinal, hilar, or axillary lymph nodes. Esophagus within normal limits. Lungs/Pleura: Tracheobronchial tree intact and patent. Lungs well inflated bilaterally. Minimal ground-glass density within the mid left upper lobe, which could reflect a small focus of pneumonitis (series  7, image 28). Additional 4 mm ground-glass nodule noted within the peripheral left upper lobe (series 7, image 28). No other worrisome pulmonary nodule or mass.No other consolidative opacity. No edema or effusion. No pneumothorax. Musculoskeletal: External soft tissues demonstrate no acute finding. No acute osseous finding. No discrete lytic or blastic osseous lesions. Review of the MIP images confirms the above findings. CTA ABDOMEN AND PELVIS FINDINGS VASCULAR Aorta: Advanced atherosclerotic change throughout the intra-abdominal aorta. No evidence for dissection or other acute aortic pathology. Infrarenal aorta ectatic measuring up to 2.5 cm in diameter with prominent mural thrombus. Celiac: Celiac axis and its branch vessels patent without stenosis. SMA: SMA widely patent to its distal aspect. Renals: Double renal arteries present bilaterally, both of  which are patent and well perfused. IMA: IMA patent to its distal aspect. Inflow: Advanced mixed atheromatous plaque throughout the iliac arteries bilaterally without acute abnormality. Associated stenosis of up to 50% at the distal right common iliac artery (series 5, image 137). Veins: No acute venous abnormality within the abdomen and pelvis. Review of the MIP images confirms the above findings. NON-VASCULAR Hepatobiliary: Liver demonstrates a normal contrast enhanced appearance. Gallbladder absent. Dilatation of the common bile duct up to 16 mm like related post cholecystectomy changes. No obstructive stone or other lesion identified. Pancreas: Pancreas within normal limits. No abnormal pancreatic ductal dilatation. Spleen: Spleen within normal limits. Adrenals/Urinary Tract: Bilateral adrenal nodules, measuring up to 2.6 cm on the left and 1.5 cm on the right, indeterminate. Kidneys equal in size with symmetric enhancement. Duplicated collecting systems seen bilaterally. No nephrolithiasis, hydronephrosis, or focal enhancing renal mass. No hydroureter. Partially distended bladder within normal limits. Stomach/Bowel: Stomach within normal limits. No evidence for bowel obstruction. Single mildly prominent loop of fluid-filled small bowel within the left abdomen with associated mild wall thickening, nonspecific, but could reflect an acute enteritis (series 5, image 141). No other acute inflammatory changes seen about the bowels. Appendix not visualize, compatible with prior appendectomy. Lymphatic: No adenopathy. Reproductive: Uterus is absent.  Atrophic ovaries bilaterally. Other: No free air or fluid. Diastasis of the rectus abdominus musculature noted. Musculoskeletal: No acute osseous abnormality. No discrete lytic or blastic osseous lesions. Review of the MIP images confirms the above findings. IMPRESSION: 1. No evidence for dissection or other acute aortic pathology. 2. Advanced atherosclerosis for age.  Infrarenal aorta ectatic measuring up to 2.5 cm in diameter. Ectatic abdominal aorta at risk for aneurysm development. Recommend followup by ultrasound in 5 years. This recommendation follows ACR consensus guidelines: White Paper of the ACR Incidental Findings Committee II on Vascular Findings. J Am Coll Radiol 2013; 10:789-794. 3. Mildly prominent loop of small bowel within the left abdomen with associated mild wall thickening. Findings nonspecific, but could reflect sequelae of an acute enteritis. No associated obstruction or other complication. 4. Small focus of ground-glass opacity within the mid left upper lobe, suspicious for a small focus of infectious pneumonitis. 5. Trace pericardial effusion. 6. Bilateral adrenal nodules, indeterminate. Further evaluation with dedicated adrenal mass protocol CT and/or MRI suggested for further evaluation. This could be performed on a Electronically Signed   By: Jeannine Boga M.D.   On: 08/16/2018 04:39    Procedures Procedures (including critical care time)  Medications Ordered in ED Medications  morphine 2 MG/ML injection 2 mg (2 mg Intravenous Given 08/16/18 0244)  ondansetron (ZOFRAN) injection 4 mg (4 mg Intravenous Given 08/16/18 0244)  iohexol (OMNIPAQUE) 350 MG/ML injection 100 mL (100 mLs Intravenous Contrast Given 08/16/18 0344)  levofloxacin (LEVAQUIN) IVPB 750 mg (0 mg Intravenous Stopped 08/16/18 8466)     Initial Impression / Assessment and Plan / ED Course  I have reviewed the triage vital signs and the nursing notes.  Pertinent labs & imaging results that were available during my care of the patient were reviewed by me and considered in my medical decision making (see chart for details).        Patient presents evaluation of chest pain.  Symptoms were present for 4 hours on arrival.  Patient took aspirin and nitroglycerin at home.  She did not have any relief of her chest pain with the nitroglycerin.  She did have an NSTEMI in 2017,  status post stenting.  No known recurrence of blockages.  Patient symptoms were somewhat atypical at arrival.  They came on at rest and were positional.  No associated shortness of breath, nausea, diaphoresis.  Initial troponin was negative.  EKG unchanged from previous.  Patient was given morphine and had resolution of her chest pain but still had some pain in her back.  She therefore underwent CT angiography to rule out aortic dissection.  No dissection was noted.  She does have evidence of possible early pneumonia.  This could explain the patient's pain.  She was initiated on Levaquin.  Patient reported low blood pressure sensation after she took nitroglycerin at home.  Pressure had improved here.  She was given a small dose of morphine and her pressure dropped again.  This improved with IV fluids.  Patient held in the ER and had a second troponin.  Second troponin III hours after the first was still negative.  Patient had had pain for 4 hours before arrival, this is felt to be good indication that this is not an acute coronary syndrome.  Symptoms are atypical I do not think she is having recurrence of her coronary artery disease.  Patient is at significant risk for COVID-19 during this current pandemic.  Would prefer not to admit her as she has had a very reassuring work-up thus far.  Patient will therefore be discharged, follow-up with her cardiologist as soon as possible.  Return for worsening symptoms.    Final Clinical Impressions(s) / ED Diagnoses   Final diagnoses:  Atypical chest pain  Community acquired pneumonia of left upper lobe of lung [facility])    ED Discharge Orders         Ordered    levofloxacin (LEVAQUIN) 750 MG tablet  Daily     08/16/18 0655    albuterol (PROVENTIL HFA;VENTOLIN HFA) 108 (90 Base) MCG/ACT inhaler  Every 4 hours PRN     08/16/18 0655           Orpah Greek, MD 08/16/18 [phone]

## 2018-08-16 NOTE — ED Notes (Signed)
Blood pressure lower systolic in the 72S. EDP notified. Verbal order for 500 bolus

## 2018-08-16 NOTE — ED Triage Notes (Signed)
Pt C/O mid-sternal chest pain that radiates to her back that started around 2200 last night. Pt reports the pain hurting worse when lying. Pt has taken 2 sublingual nitro and 1 81mg  Asprin PTA.

## 2018-08-16 NOTE — ED Notes (Signed)
Pt states "Ive had a cough and shortness of breathe since 10 pm last night" to x-ray tech.

## 2018-08-21 NOTE — Progress Notes (Signed)
CANCELLED [place]

## 2018-08-21 NOTE — Progress Notes (Signed)
Erin [name]: may cancel May visit for now as she has appt upcoming April 15th.

## 2018-08-22 ENCOUNTER — Encounter: Payer: Self-pay | Admitting: Gastroenterology

## 2018-08-22 ENCOUNTER — Other Ambulatory Visit: Payer: Self-pay

## 2018-08-22 ENCOUNTER — Ambulatory Visit (INDEPENDENT_AMBULATORY_CARE_PROVIDER_SITE_OTHER): Payer: Managed Care, Other (non HMO) | Admitting: Gastroenterology

## 2018-08-22 DIAGNOSIS — K529 Noninfective gastroenteritis and colitis, unspecified: Secondary | ICD-10-CM

## 2018-08-22 MED ORDER — ONDANSETRON HCL 4 MG PO TABS: 4.0000 mg | ORAL_TABLET | Freq: Three times a day (TID) | ORAL | 1 refills | Status: DC | PRN

## 2018-08-22 NOTE — Patient Instructions (Signed)
As we discussed during your visit, try Imodium for 24-48 hours, then give me a call.  For now, keep plans for colonoscopy and upper endoscopy with dilatation in June 2020.  I enjoyed talking with you again today! As you know, I value our relationship and want to provide genuine, compassionate, and quality care. I welcome your feedback. If you receive a survey regarding your visit,  I greatly appreciate you taking time to fill this out. See you next time!  Annitta Needs, PhD, ANP-BC [facility]

## 2018-08-22 NOTE — H&P (View-Only) (Signed)
Primary Care Physician:  [name], MD  Primary GI: Dr. Gala Romney   Virtual Visit via Telephone Note Due to COVID-19, visit is conducted virtually and was requested by patient.   I connected with [name] on 08/22/18 at  2:00 PM EDT by telephone and verified that I am speaking with the correct person using two identifiers.   I discussed the limitations, risks, security and privacy concerns of performing an evaluation and management service by telephone and the availability of in person appointments. I also discussed with the patient that there may be a patient responsible charge related to this service. The patient expressed understanding and agreed to proceed.  Chief Complaint  Patient presents with  . Diarrhea    watery; after eating/drinking; has lost 4 lbs in 1 week  . Abdominal Pain  . Nausea    gags but doesn't vomit     History of Present Illness: 58 year old female with history of CDI in Aug 2019 s/p Vanc. Chronic dysphagia. No prior EGD/TCS. Scheduled for June 2020. Chronic GERD. Recently recurrent bout with diarrhea but Cdiff negative.   Diarrhea for 3 weeks. Postprandial, 3-5 times a day. Having to use diaper rash cream. Lost 4 lbs in a week. Was on antibiotics last week for pneumonia but diarrhea started prior to this. Only one episode of rectal bleeding. Haven't started imodium yet. Trying not to go out unless she has a prescription to pick up, so she is going out today. Has well water but only drinks bottle water. No sick contacts. Abdominal discomfort lower abdomen. A lot of nausea. [place] like last time. Will have a gush of water, no smell.   Denies any reflux issues currently and not on a PPI. History of vague solid food dysphagia.   Past Medical History:  Diagnosis Date  . Anxiety state 03/25/2016  . Basal cell carcinoma of forehead   . CHF (congestive heart failure) ([place])   . Coronary artery disease    a. 03/11/16 PCI with DES-->Prox/Mid Cx;  b.  03/14/16 PCI with DES x2-->RCA, EF 30-35%.  . Essential hypertension   . GERD (gastroesophageal reflux disease)   . HFrEF (heart failure with reduced ejection fraction) ([place])    a. 10/2016 Echo: EF 35-40%, Gr1 DD, mild focal basal septal hypertrophy, basal inflat, mid inflat, basal antlat AK. Mid infept/inf/antlat, apical lateral sev HK. Mod MR. mildly reduced RV fxn. Mild TR.  [name] Kitchen History of pneumonia   . Hyperlipidemia   . Ischemic cardiomyopathy    a. 10/2016 Echo: EF 35-40%, Gr1 DD.  [name] Kitchen Mitral regurgitation   . NSTEMI (non-ST elevated myocardial infarction) ([place]) 03/10/2016  . Squamous cell cancer of skin of nose   . Thrombocytosis ([place]) 03/26/2016  . Tobacco abuse   . Trichimoniasis   . Wears dentures   . Wears glasses      Past Surgical History:  Procedure Laterality Date  . APPENDECTOMY    . CARDIAC CATHETERIZATION N/A 03/11/2016   Procedure: Left Heart Cath and Coronary Angiography;  Surgeon: Leonie Man, MD;  Location: [facility];  Service: Cardiovascular;  Laterality: N/A;  . CARDIAC CATHETERIZATION N/A 03/11/2016   Procedure: Coronary Stent Intervention;  Surgeon: Leonie Man, MD;  Location: [facility];  Service: Cardiovascular;  Laterality: N/A;  . CARDIAC CATHETERIZATION N/A 03/14/2016   Procedure: Coronary Stent Intervention;  Surgeon: Peter M Martinique, MD;  Location: [facility];  Service: Cardiovascular;  Laterality: N/A;  . CHOLECYSTECTOMY OPEN  [place] WITH STENT PLACEMENT  03/14/2016  . FINGER ARTHROPLASTY Left 05/14/2013   Procedure: LEFT THUMB CARPAL METACARPAL ARTHROPLASTY;  Surgeon: Tennis Must, MD;  Location: [facility];  Service: Orthopedics;  Laterality: Left;  . IR ANGIO INTRA EXTRACRAN SEL COM CAROTID INNOMINATE BILAT MOD SED  01/05/2017  . IR ANGIO VERTEBRAL SEL VERTEBRAL BILAT MOD SED  01/05/2017  . IR RADIOLOGIST EVAL & MGMT  12/30/2016  . TUBAL LIGATION  1987  . VAGINAL HYSTERECTOMY  2009      Current Meds  Medication Sig  . acetaminophen (TYLENOL) 325 MG tablet Take 325 mg by mouth every 6 (six) hours as needed for mild pain or headache.   . ALPRAZolam (XANAX) 1 MG tablet Take 1 mg by mouth at bedtime.   [name] Kitchen aspirin EC 81 MG tablet Take 81 mg by mouth every evening.   [name] Kitchen atorvastatin (LIPITOR) 80 MG tablet TAKE 1 TABLET BY MOUTH ONCE DAILY AT  6  IN  THE  EVENING (Patient taking differently: Take 80 mg by mouth every evening. )  . ezetimibe (ZETIA) 10 MG tablet Take 1 tablet by mouth once daily  . furosemide (LASIX) 20 MG tablet Take 1 tablet (20 mg total) by mouth daily.  [name] Kitchen gabapentin (NEURONTIN) 100 MG capsule Take 100 mg by mouth 3 (three) times daily.  . isosorbide mononitrate (IMDUR) 30 MG 24 hr tablet Take 1 tablet by mouth once daily  . LORazepam (ATIVAN) 1 MG tablet Take 1 mg by mouth daily as needed for anxiety.   . metoprolol succinate (TOPROL XL) 25 MG 24 hr tablet Take 0.5 tablets (12.5 mg total) by mouth daily. (Patient taking differently: Take 12.5 mg by mouth daily. Hasn't taken in past week d/t low BP)  . Multiple Vitamins-Minerals (MULTIVITAMIN WITH MINERALS) tablet Take 1 tablet by mouth daily.  . nicotine (NICODERM CQ - DOSED IN MG/24 HOURS) 21 mg/24hr patch Place 21 mg onto the skin daily.  . nitroGLYCERIN (NITROSTAT) 0.4 MG SL tablet Place 1 tablet (0.4 mg total) under the tongue every 5 (five) minutes x 3 doses as needed for chest pain.  [name] Kitchen omeprazole (PRILOSEC) 20 MG capsule Take 20 mg by mouth daily.  . pantoprazole (PROTONIX) 40 MG tablet Take 1 tablet (40 mg total) by mouth daily. Take 30 minutes prior to breakfast  . potassium chloride SA (KLOR-CON M20) 20 MEQ tablet Take 1 tablet (20 mEq total) by mouth daily.  [name] Kitchen rOPINIRole (REQUIP) 0.5 MG tablet Take 0.5 mg by mouth at bedtime.   . sacubitril-valsartan (ENTRESTO) 24-26 MG Take 1 tablet by mouth 2 (two) times daily.       Observations/Objective: No distress. Unable to perform physical exam due to telephone  encounter. No video available.   Assessment and Plan: 58 year old female with history of CDI in Aug 2019 s/p vanc for 2 weeks, symptomatically improving. Now with recurrent diarrhea for 3 weeks but negative CDI. Previously recommended trial of imodium, which she hasn't started yet. I have asked her to start this and call me in the next 24-48 hours. May need additional stool studies.   History of dysphagia: no reflux currently. Already scheduled for EGD/dilatation at time of TCS, which is in June 2020.   Further recommendations to follow after trial of Imodium for 24-48 hours.  Follow Up Instructions: See AVS   I discussed the assessment and treatment plan with the patient. The patient was provided an opportunity to ask questions and all were  answered. The patient agreed with the plan and demonstrated an understanding of the instructions.   The patient was advised to call back or seek an in-person evaluation if the symptoms worsen or if the condition fails to improve as anticipated.  I provided 15 minutes of non-face-to-face time during this encounter.  Annitta Needs, PhD, ANP-BC [facility]

## 2018-08-22 NOTE — Progress Notes (Signed)
Primary Care Physician:  [name], MD  Primary GI: Dr. Gala Romney   Virtual Visit via Telephone Note Due to COVID-19, visit is conducted virtually and was requested by patient.   I connected with [name] on 08/22/18 at  2:00 PM EDT by telephone and verified that I am speaking with the correct person using two identifiers.   I discussed the limitations, risks, security and privacy concerns of performing an evaluation and management service by telephone and the availability of in person appointments. I also discussed with the patient that there may be a patient responsible charge related to this service. The patient expressed understanding and agreed to proceed.  Chief Complaint  Patient presents with  . Diarrhea    watery; after eating/drinking; has lost 4 lbs in 1 week  . Abdominal Pain  . Nausea    gags but doesn't vomit     History of Present Illness: 58 year old female with history of CDI in Aug 2019 s/p Vanc. Chronic dysphagia. No prior EGD/TCS. Scheduled for June 2020. Chronic GERD. Recently recurrent bout with diarrhea but Cdiff negative.   Diarrhea for 3 weeks. Postprandial, 3-5 times a day. Having to use diaper rash cream. Lost 4 lbs in a week. Was on antibiotics last week for pneumonia but diarrhea started prior to this. Only one episode of rectal bleeding. Haven't started imodium yet. Trying not to go out unless she has a prescription to pick up, so she is going out today. Has well water but only drinks bottle water. No sick contacts. Abdominal discomfort lower abdomen. A lot of nausea. [place] like last time. Will have a gush of water, no smell.   Denies any reflux issues currently and not on a PPI. History of vague solid food dysphagia.   Past Medical History:  Diagnosis Date  . Anxiety state 03/25/2016  . Basal cell carcinoma of forehead   . CHF (congestive heart failure) ([place])   . Coronary artery disease    a. 03/11/16 PCI with DES-->Prox/Mid Cx;  b.  03/14/16 PCI with DES x2-->RCA, EF 30-35%.  . Essential hypertension   . GERD (gastroesophageal reflux disease)   . HFrEF (heart failure with reduced ejection fraction) ([place])    a. 10/2016 Echo: EF 35-40%, Gr1 DD, mild focal basal septal hypertrophy, basal inflat, mid inflat, basal antlat AK. Mid infept/inf/antlat, apical lateral sev HK. Mod MR. mildly reduced RV fxn. Mild TR.  [name] Kitchen History of pneumonia   . Hyperlipidemia   . Ischemic cardiomyopathy    a. 10/2016 Echo: EF 35-40%, Gr1 DD.  [name] Kitchen Mitral regurgitation   . NSTEMI (non-ST elevated myocardial infarction) ([place]) 03/10/2016  . Squamous cell cancer of skin of nose   . Thrombocytosis ([place]) 03/26/2016  . Tobacco abuse   . Trichimoniasis   . Wears dentures   . Wears glasses      Past Surgical History:  Procedure Laterality Date  . APPENDECTOMY    . CARDIAC CATHETERIZATION N/A 03/11/2016   Procedure: Left Heart Cath and Coronary Angiography;  Surgeon: Leonie Man, MD;  Location: [facility];  Service: Cardiovascular;  Laterality: N/A;  . CARDIAC CATHETERIZATION N/A 03/11/2016   Procedure: Coronary Stent Intervention;  Surgeon: Leonie Man, MD;  Location: [facility];  Service: Cardiovascular;  Laterality: N/A;  . CARDIAC CATHETERIZATION N/A 03/14/2016   Procedure: Coronary Stent Intervention;  Surgeon: Peter M Martinique, MD;  Location: [facility];  Service: Cardiovascular;  Laterality: N/A;  . CHOLECYSTECTOMY OPEN  [place] WITH STENT PLACEMENT  03/14/2016  . FINGER ARTHROPLASTY Left 05/14/2013   Procedure: LEFT THUMB CARPAL METACARPAL ARTHROPLASTY;  Surgeon: Tennis Must, MD;  Location: [facility];  Service: Orthopedics;  Laterality: Left;  . IR ANGIO INTRA EXTRACRAN SEL COM CAROTID INNOMINATE BILAT MOD SED  01/05/2017  . IR ANGIO VERTEBRAL SEL VERTEBRAL BILAT MOD SED  01/05/2017  . IR RADIOLOGIST EVAL & MGMT  12/30/2016  . TUBAL LIGATION  1987  . VAGINAL HYSTERECTOMY  2009      Current Meds  Medication Sig  . acetaminophen (TYLENOL) 325 MG tablet Take 325 mg by mouth every 6 (six) hours as needed for mild pain or headache.   . ALPRAZolam (XANAX) 1 MG tablet Take 1 mg by mouth at bedtime.   [name] Kitchen aspirin EC 81 MG tablet Take 81 mg by mouth every evening.   [name] Kitchen atorvastatin (LIPITOR) 80 MG tablet TAKE 1 TABLET BY MOUTH ONCE DAILY AT  6  IN  THE  EVENING (Patient taking differently: Take 80 mg by mouth every evening. )  . ezetimibe (ZETIA) 10 MG tablet Take 1 tablet by mouth once daily  . furosemide (LASIX) 20 MG tablet Take 1 tablet (20 mg total) by mouth daily.  [name] Kitchen gabapentin (NEURONTIN) 100 MG capsule Take 100 mg by mouth 3 (three) times daily.  . isosorbide mononitrate (IMDUR) 30 MG 24 hr tablet Take 1 tablet by mouth once daily  . LORazepam (ATIVAN) 1 MG tablet Take 1 mg by mouth daily as needed for anxiety.   . metoprolol succinate (TOPROL XL) 25 MG 24 hr tablet Take 0.5 tablets (12.5 mg total) by mouth daily. (Patient taking differently: Take 12.5 mg by mouth daily. Hasn't taken in past week d/t low BP)  . Multiple Vitamins-Minerals (MULTIVITAMIN WITH MINERALS) tablet Take 1 tablet by mouth daily.  . nicotine (NICODERM CQ - DOSED IN MG/24 HOURS) 21 mg/24hr patch Place 21 mg onto the skin daily.  . nitroGLYCERIN (NITROSTAT) 0.4 MG SL tablet Place 1 tablet (0.4 mg total) under the tongue every 5 (five) minutes x 3 doses as needed for chest pain.  [name] Kitchen omeprazole (PRILOSEC) 20 MG capsule Take 20 mg by mouth daily.  . pantoprazole (PROTONIX) 40 MG tablet Take 1 tablet (40 mg total) by mouth daily. Take 30 minutes prior to breakfast  . potassium chloride SA (KLOR-CON M20) 20 MEQ tablet Take 1 tablet (20 mEq total) by mouth daily.  [name] Kitchen rOPINIRole (REQUIP) 0.5 MG tablet Take 0.5 mg by mouth at bedtime.   . sacubitril-valsartan (ENTRESTO) 24-26 MG Take 1 tablet by mouth 2 (two) times daily.       Observations/Objective: No distress. Unable to perform physical exam due to telephone  encounter. No video available.   Assessment and Plan: 58 year old female with history of CDI in Aug 2019 s/p vanc for 2 weeks, symptomatically improving. Now with recurrent diarrhea for 3 weeks but negative CDI. Previously recommended trial of imodium, which she hasn't started yet. I have asked her to start this and call me in the next 24-48 hours. May need additional stool studies.   History of dysphagia: no reflux currently. Already scheduled for EGD/dilatation at time of TCS, which is in June 2020.   Further recommendations to follow after trial of Imodium for 24-48 hours.  Follow Up Instructions: See AVS   I discussed the assessment and treatment plan with the patient. The patient was provided an opportunity to ask questions and all were  answered. The patient agreed with the plan and demonstrated an understanding of the instructions.   The patient was advised to call back or seek an in-person evaluation if the symptoms worsen or if the condition fails to improve as anticipated.  I provided 15 minutes of non-face-to-face time during this encounter.  Annitta Needs, PhD, ANP-BC [facility]

## 2018-08-23 NOTE — Progress Notes (Signed)
cc'ed to pcp °

## 2018-08-27 ENCOUNTER — Other Ambulatory Visit: Payer: Self-pay

## 2018-08-27 ENCOUNTER — Telehealth: Payer: Self-pay

## 2018-08-27 ENCOUNTER — Telehealth: Payer: Self-pay | Admitting: Cardiovascular Disease

## 2018-08-27 DIAGNOSIS — K529 Noninfective gastroenteritis and colitis, unspecified: Secondary | ICD-10-CM

## 2018-08-27 DIAGNOSIS — R197 Diarrhea, unspecified: Secondary | ICD-10-CM

## 2018-08-27 NOTE — Telephone Encounter (Signed)
Let's recheck Cdiff toxin A and B, GDH, stool culture, Giardia. I placed the orders, just needs to be released.   Drink water to keep urine light/clear. Follow a low fiber diet for now.

## 2018-08-27 NOTE — Telephone Encounter (Signed)
° ° °  Virtual Visit Pre-Appointment Phone Call  Steps For Call:  1. Confirm consent - "In the setting of the current Covid19 crisis, you are scheduled for a (phone or video) visit with your provider on (date) at (time).  Just as we do with many in-office visits, in order for you to participate in this visit, we must obtain consent.  If you'd like, I can send this to your mychart (if signed up) or email for you to review.  Otherwise, I can obtain your verbal consent now.  All virtual visits are billed to your insurance company just like a normal visit would be.  By agreeing to a virtual visit, we'd like you to understand that the technology does not allow for your provider to perform an examination, and thus may limit your provider's ability to fully assess your condition. If your provider identifies any concerns that need to be evaluated in person, we will make arrangements to do so.  Finally, though the technology is pretty good, we cannot assure that it will always work on either your or our end, and in the setting of a video visit, we may have to convert it to a phone-only visit.  In either situation, we cannot ensure that we have a secure connection.  Are you willing to proceed?" STAFF: Did the patient verbally acknowledge consent to telehealth visit? Document YES/NO here: Yes  2. Confirm the BEST phone number to call the day of the visit by including in appointment notes  3. Give patient instructions for WebEx/MyChart download to smartphone as below or [email or web address] if video visit (depending on what platform provider is using)  4. Advise patient to be prepared with their blood pressure, heart rate, weight, any heart rhythm information, their current medicines, and a piece of paper and pen handy for any instructions they may receive the day of their visit  5. Inform patient they will receive a phone call 15 minutes prior to their appointment time (may be from unknown caller ID) so they should be  prepared to answer  6. Confirm that appointment type is correct in Epic appointment notes (VIDEO vs PHONE)     TELEPHONE CALL NOTE  Erin [name] has been deemed a candidate for a follow-up tele-health visit to limit community exposure during the Covid-19 pandemic. I spoke with the patient via phone to ensure availability of phone/video source, confirm preferred email & phone number, and discuss instructions and expectations.  I reminded Erin [name] to be prepared with any vital sign and/or heart rhythm information that could potentially be obtained via home monitoring, at the time of her visit. I reminded Erin [name] to expect a phone call at the time of her visit if her visit.  [name] 08/27/2018 2:40 PM

## 2018-08-27 NOTE — Telephone Encounter (Signed)
Pt called office to give progress report to AB. She has been taking Imodium 3 times/day since visit w/AB. Imodium has slowed diarrhea some. She is now having diarrhea approx 5x/day with Imodium. She had diarrhea approx 8x/day prior to starting Imodium. She is having abdominal cramps with some nausea and gagging but no vomiting.

## 2018-08-27 NOTE — Telephone Encounter (Signed)
Erin [name] pt can't do [facility] since her insurance only covers [facility]. I'll see if I can find the test for [facility].

## 2018-08-28 ENCOUNTER — Telehealth (INDEPENDENT_AMBULATORY_CARE_PROVIDER_SITE_OTHER): Payer: Managed Care, Other (non HMO) | Admitting: Cardiovascular Disease

## 2018-08-28 ENCOUNTER — Encounter: Payer: Self-pay | Admitting: Cardiovascular Disease

## 2018-08-28 ENCOUNTER — Encounter: Payer: Self-pay | Admitting: *Deleted

## 2018-08-28 VITALS — BP 102/65 | HR 93 | Ht 60.0 in | Wt 136.0 lb

## 2018-08-28 DIAGNOSIS — E785 Hyperlipidemia, unspecified: Secondary | ICD-10-CM

## 2018-08-28 DIAGNOSIS — I25118 Atherosclerotic heart disease of native coronary artery with other forms of angina pectoris: Secondary | ICD-10-CM | POA: Diagnosis not present

## 2018-08-28 DIAGNOSIS — I5042 Chronic combined systolic (congestive) and diastolic (congestive) heart failure: Secondary | ICD-10-CM | POA: Diagnosis not present

## 2018-08-28 DIAGNOSIS — I10 Essential (primary) hypertension: Secondary | ICD-10-CM | POA: Diagnosis not present

## 2018-08-28 NOTE — Progress Notes (Signed)
Virtual Visit via Telephone Note   This visit type was conducted due to national recommendations for restrictions regarding the COVID-19 Pandemic (e.g. social distancing) in an effort to limit this patient's exposure and mitigate transmission in our community.  Due to her co-morbid illnesses, this patient is at least at moderate risk for complications without adequate follow up.  This format is felt to be most appropriate for this patient at this time.  The patient did not have access to video technology/had technical difficulties with video requiring transitioning to audio format only (telephone).  All issues noted in this document were discussed and addressed.  No physical exam could be performed with this format.  Please refer to the patient's chart for her  consent to telehealth for [facility].   Evaluation Performed:  Follow-up visit  Date:  08/28/2018   ID:  [name], Erin [birth year], MRN [id number]  Patient Location: Home Provider Location: Office  PCP:  [name], MD  Cardiologist:  Kate Sable, MD  Electrophysiologist:  None   Chief Complaint:  CAD  History of Present Illness:    Erin [name] is a 58 y.o. female with past medical history of CAD (s/p DES to LCx with staged PCI/DESx2 to RCA/PDA in 03/2016, NST in 03/2017 showing large scar with minimal peri-infarct ischemia), chronic combined systolic and diastolic CHF,HTN,andHLD.  Echocardiogram 12/20/2017 showed a slight improvement in LVEF now up to 40 to 45% with wall motion abnormalities, grade 1 diastolic dysfunction, and mild mitral and tricuspid regurgitation. There is a small circumferential pericardial effusion.  She was evaluated for chest pain in the ED on 08/16/2018.  Troponins were normal.  Symptoms were felt atypical for coronary artery disease.  She was thought to have possible evidence of early pneumonia and it was felt this could explain her symptoms.  She was initiated on Levaquin.  She  told me she has been having "bad stomach issues and severe diarrhea ".  She is going to have a stool culture.  She denies chest pain, shortness of breath, leg swelling, lightheadedness, and dizziness.  She is now trying to drink more water.  The patient does not have symptoms concerning for COVID-19 infection (fever, chills, cough, or new shortness of breath).    Past Medical History:  Diagnosis Date   Anxiety state 03/25/2016   Basal cell carcinoma of forehead    CHF (congestive heart failure) ([place])    Coronary artery disease    a. 03/11/16 PCI with DES-->Prox/Mid Cx;  b. 03/14/16 PCI with DES x2-->RCA, EF 30-35%.   Essential hypertension    GERD (gastroesophageal reflux disease)    HFrEF (heart failure with reduced ejection fraction) ([name])    a. 10/2016 Echo: EF 35-40%, Gr1 DD, mild focal basal septal hypertrophy, basal inflat, mid inflat, basal antlat AK. Mid infept/inf/antlat, apical lateral sev HK. Mod MR. mildly reduced RV fxn. Mild TR.   History of pneumonia    Hyperlipidemia    Ischemic cardiomyopathy    a. 10/2016 Echo: EF 35-40%, Gr1 DD.   Mitral regurgitation    NSTEMI (non-ST elevated myocardial infarction) ([place]) 03/10/2016   Squamous cell cancer of skin of nose    Thrombocytosis ([place]) 03/26/2016   Tobacco abuse    Trichimoniasis    Wears dentures    Wears glasses    Past Surgical History:  Procedure Laterality Date   APPENDECTOMY     CARDIAC CATHETERIZATION N/A 03/11/2016   Procedure: Left Heart Cath and Coronary Angiography;  Surgeon:  Leonie Man, MD;  Location: [facility];  Service: Cardiovascular;  Laterality: N/A;   CARDIAC CATHETERIZATION N/A 03/11/2016   Procedure: Coronary Stent Intervention;  Surgeon: Leonie Man, MD;  Location: [facility];  Service: Cardiovascular;  Laterality: N/A;   CARDIAC CATHETERIZATION N/A 03/14/2016   Procedure: Coronary Stent Intervention;  Surgeon: Peter M Martinique, MD;  Location: [facility];  Service: Cardiovascular;  Laterality: N/A;   CHOLECYSTECTOMY OPEN  1984   CORONARY ANGIOPLASTY WITH STENT PLACEMENT  03/14/2016   FINGER ARTHROPLASTY Left 05/14/2013   Procedure: LEFT THUMB CARPAL METACARPAL ARTHROPLASTY;  Surgeon: Tennis Must, MD;  Location: [facility];  Service: Orthopedics;  Laterality: Left;   IR ANGIO INTRA EXTRACRAN SEL COM CAROTID INNOMINATE BILAT MOD SED  01/05/2017   IR ANGIO VERTEBRAL SEL VERTEBRAL BILAT MOD SED  01/05/2017   IR RADIOLOGIST EVAL & MGMT  12/30/2016   TUBAL LIGATION  1987   VAGINAL HYSTERECTOMY  2009     Current Meds  Medication Sig   acetaminophen (TYLENOL) 325 MG tablet Take 325 mg by mouth every 6 (six) hours as needed for mild pain or headache.    ALPRAZolam (XANAX) 1 MG tablet Take 1 mg by mouth at bedtime.    aspirin EC 81 MG tablet Take 81 mg by mouth every evening.    atorvastatin (LIPITOR) 80 MG tablet TAKE 1 TABLET BY MOUTH ONCE DAILY AT  6  IN  THE  EVENING (Patient taking differently: Take 80 mg by mouth every evening. )   ezetimibe (ZETIA) 10 MG tablet Take 1 tablet by mouth once daily   furosemide (LASIX) 20 MG tablet Take 1 tablet (20 mg total) by mouth daily.   gabapentin (NEURONTIN) 100 MG capsule Take 100 mg by mouth 3 (three) times daily.   isosorbide mononitrate (IMDUR) 30 MG 24 hr tablet Take 1 tablet by mouth once daily   Loperamide HCl (IMODIUM PO) Take by mouth.   LORazepam (ATIVAN) 1 MG tablet Take 1 mg by mouth daily as needed for anxiety.    metoprolol succinate (TOPROL XL) 25 MG 24 hr tablet Take 0.5 tablets (12.5 mg total) by mouth daily. (Patient taking differently: Take 12.5 mg by mouth daily. Hasn't taken in past week d/t low BP)   Multiple Vitamins-Minerals (MULTIVITAMIN WITH MINERALS) tablet Take 1 tablet by mouth daily.   nicotine (NICODERM CQ - DOSED IN MG/24 HOURS) 21 mg/24hr patch Place 21 mg onto the skin daily.   nitroGLYCERIN (NITROSTAT) 0.4 MG SL tablet Place 1 tablet  (0.4 mg total) under the tongue every 5 (five) minutes x 3 doses as needed for chest pain.   ondansetron (ZOFRAN) 4 MG tablet Take 1 tablet (4 mg total) by mouth every 8 (eight) hours as needed for nausea or vomiting.   potassium chloride SA (KLOR-CON M20) 20 MEQ tablet Take 1 tablet (20 mEq total) by mouth daily.   rOPINIRole (REQUIP) 0.5 MG tablet Take 0.5 mg by mouth at bedtime.    sacubitril-valsartan (ENTRESTO) 24-26 MG Take 1 tablet by mouth 2 (two) times daily.     Allergies:   Tape   Social History   Tobacco Use   Smoking status: Current Some Day Smoker    Packs/day: 0.10    Years: 15.00    Pack years: 1.50    Types: Cigarettes   Smokeless tobacco: Never Used   Tobacco comment: smokes a cigarette occasionally  Substance Use Topics   Alcohol  use: Yes    Comment: occasionally   Drug use: Not Currently    Types: Marijuana    Comment: former     Family Hx: The patient's family history includes Breast cancer in her maternal grandmother; CAD in her father; Colon polyps (age of onset: 23) in her father; Diabetes in her father and mother; Heart failure in an other family member; Hypertension in her father and mother; Stroke in her mother. There is no history of Colon cancer.  ROS:   Please see the history of present illness.     All other systems reviewed and are negative.   Prior CV studies:   The following studies were reviewed today:  Reviewed above  Labs/Other Tests and Data Reviewed:    EKG: ECG on 08/16/2018 showed sinus rhythm with low voltage.  Recent Labs: 04/13/2018: ALT 22 08/16/2018: B Natriuretic Peptide 69.0; BUN 17; Creatinine, Ser 1.11; Hemoglobin 12.3; Platelets 420; Potassium 3.3; Sodium 134   Recent Lipid Panel Lab Results  Component Value Date/Time   CHOL 179 03/19/2017 06:43 AM   TRIG 245 (H) 03/19/2017 06:43 AM   HDL 49 03/19/2017 06:43 AM   CHOLHDL 3.7 03/19/2017 06:43 AM   LDLCALC 81 03/19/2017 06:43 AM    Wt Readings from Last 3  Encounters:  08/28/18 136 lb (61.7 kg)  08/16/18 137 lb (62.1 kg)  06/06/18 133 lb 12.8 oz (60.7 kg)     Objective:    Vital Signs:  BP 102/65    Pulse 93    Ht 5' (1.524 m)    Wt 136 lb (61.7 kg)    BMI 26.56 kg/m    VITAL SIGNS:  reviewed  ASSESSMENT & PLAN:    1. Coronary artery disease: Symptomatically stable overall. Continue aspirin,atorvastatin,Zetia,Toprol-XL12.5 mg daily, and Imdur 30 mg.  2. Hypertension: Blood pressure is normal. Has been low on occasion at home. Checks it daily and running 90/60's. For the most part she's been asymptomatic.  She has been having significant diarrhea which is contributing to this.  No changes to therapy for now.  3. Hyperlipidemia: LDL 81 in November 2018. Continue Lipitor 80 mg and Zetia. Will need repeat lipids.  4. Cardiomyopathy/chronic combined systolic and diastolic heart failure: Currently on Toprol-XL 12.5 mg daily, Entresto 24-26 mg bid, and Lasix 20 mg daily with supplemental potassium.  LVEF 40 to 45%. She has been instructed to take an extra 20 mg of Lasix for weight increase of 3 pounds in 24 hours.    COVID-19 Education: The signs and symptoms of COVID-19 were discussed with the patient and how to seek care for testing (follow up with PCP or arrange E-visit).  The importance of social distancing was discussed today.  Time:   Today, I have spent 15 minutes with the patient with telehealth technology discussing the above problems.     Medication Adjustments/Labs and Tests Ordered: Current medicines are reviewed at length with the patient today.  Concerns regarding medicines are outlined above.   Tests Ordered: No orders of the defined types were placed in this encounter.   Medication Changes: No orders of the defined types were placed in this encounter.   Disposition:  Follow up in 4 month(s)  Signed, Kate Sable, MD  08/28/2018 1:48 PM    [facility]

## 2018-08-28 NOTE — Patient Instructions (Signed)
Your physician recommends that you schedule a follow-up appointment in: [place] [facility]   Your physician recommends that you continue on your current medications as directed. Please refer to the Current Medication list given to you today.  Thank you for choosing [facility]!!

## 2018-08-31 LAB — GIARDIA, EIA; OVA/PARASITE: Giardia Ag, Stl: NEGATIVE

## 2018-09-03 LAB — STOOL CULTURE: E coli, Shiga toxin Assay: NEGATIVE

## 2018-09-04 ENCOUNTER — Telehealth: Payer: Self-pay | Admitting: *Deleted

## 2018-09-04 NOTE — Progress Notes (Signed)
Can they add this on?

## 2018-09-04 NOTE — Telephone Encounter (Signed)
Labs received and placed on AB desk

## 2018-09-05 MED ORDER — DICYCLOMINE HCL 10 MG PO CAPS: 10.0000 mg | ORAL_CAPSULE | Freq: Three times a day (TID) | ORAL | 3 refills | Status: DC

## 2018-09-05 NOTE — Telephone Encounter (Addendum)
Cdiff negative. Stool culture negative. I have sent in Bentyl to take before meals and at bedtime. Monitor for dry mouth, constipation, dizziness. Please have her call us next week with an update.

## 2018-09-05 NOTE — Addendum Note (Signed)
Addended by: Annitta Needs on: 09/05/2018 11:54 AM   Modules accepted: Orders

## 2018-09-05 NOTE — Telephone Encounter (Signed)
Pt notified of results. Pt will pick medication up and call with an update next week.

## 2018-09-11 ENCOUNTER — Telehealth: Payer: Self-pay | Admitting: Internal Medicine

## 2018-09-11 MED ORDER — CHOLESTYRAMINE LIGHT 4 G PO PACK: 2.0000 g | PACK | Freq: Every day | ORAL | 1 refills | Status: DC

## 2018-09-11 NOTE — Telephone Encounter (Signed)
Initially, patient's TCS/EGD/dilatation had been postponed due to COVID. At this point, she is having persistent, concerning symptoms despite supportive measures. Cdiff, stool culture negative. No appetite, forcing herself to eat. Bentyl prescribed recently with no improvement.   Alicia: Make sure she takes Zofran when needed for nausea. I have sent in [place] that is a powder to take 1/2 packet daily, which can be titrated up if needed. Make sure she drinks plenty of liquids with this. We could change to Colestid if she does not tolerate it.   Take other oral medications at least an [place] taking Questran, or she can take her other medications 4 hours AFTER taking Questran, as this medication can affect absorption of other medications if taken together.   [facility] clinical pool: we need to bump up colonoscopy/EGD/dilatation with Propofol due to her symptoms. Preferably within 2 week timeframe.

## 2018-09-11 NOTE — Telephone Encounter (Signed)
Spoke with pt. She continues to have diarrhea with stomach pain at the bottom of her stomach. Pt states that she still doesn't have an appetite and makes herself eat. Pt is taking Dicyclomine 1 mg tid. Pt vomited twice last week and states she doesn't vomit every day.

## 2018-09-11 NOTE — Telephone Encounter (Signed)
Spoke with pt. She is aware of AB's recommendations. She will pick Questrin powder up. Pt is aware that she will speak with our office about moving her appointment up after [facility] has responded back.

## 2018-09-11 NOTE — Telephone Encounter (Signed)
Yes.  Do it.

## 2018-09-11 NOTE — Telephone Encounter (Signed)
Dr. Gala Romney, please review and let us know if we can add patient on for 09/20/2018? Only available date to add on for double. Patient currently scheduled for 6/4. Thanks

## 2018-09-11 NOTE — Telephone Encounter (Signed)
Pt also has Zofran at home to take as needed for nausea.

## 2018-09-11 NOTE — Telephone Encounter (Signed)
Pt said to let AB know that she has been taking Dicyclomine 10mg  3 x day and it isn't helping. Please advise [phone]

## 2018-09-12 NOTE — Telephone Encounter (Signed)
Carolyn in endo called back and stated patient pre-op appt scheduled for 5/11 at 2:45pm. Patient is aware.

## 2018-09-12 NOTE — Telephone Encounter (Signed)
Called patient and is aware we are moving procedure date up to 5/14 at 9:30am. Patient aware will call back regarding pre-op appt. Discussed new dates/times/instructions for prep in detail with patient. She voiced understanding. Called endo and LMOVM

## 2018-09-17 ENCOUNTER — Encounter (HOSPITAL_COMMUNITY)
Admission: RE | Admit: 2018-09-17 | Discharge: 2018-09-17 | Disposition: A | Discharge status: Home / Self Care | Payer: Managed Care, Other (non HMO) | Source: Ambulatory Visit | Attending: Internal Medicine | Admitting: Internal Medicine

## 2018-09-17 ENCOUNTER — Telehealth: Payer: Self-pay | Admitting: Internal Medicine

## 2018-09-17 ENCOUNTER — Encounter (HOSPITAL_COMMUNITY): Payer: Self-pay

## 2018-09-17 ENCOUNTER — Other Ambulatory Visit: Payer: Self-pay

## 2018-09-17 MED ORDER — PEG 3350-KCL-NA BICARB-NACL 420 G PO SOLR: 4000.0000 mL | Freq: Once | ORAL | 0 refills | Status: AC

## 2018-09-17 NOTE — Telephone Encounter (Signed)
PLEASE SEND PREP KIT PRESCRIPTION TO HER PHARMACY

## 2018-09-17 NOTE — Telephone Encounter (Signed)
Routing message to [facility]. 

## 2018-09-17 NOTE — Addendum Note (Signed)
Addended by: Inge Rise on: 09/17/2018 10:30 AM   Modules accepted: Orders

## 2018-09-17 NOTE — Telephone Encounter (Signed)
rx sent in 

## 2018-09-20 ENCOUNTER — Ambulatory Visit (HOSPITAL_COMMUNITY)
Admission: RE | Admit: 2018-09-20 | Discharge: 2018-09-20 | Disposition: A | Discharge status: Home / Self Care | Payer: Managed Care, Other (non HMO) | Attending: Internal Medicine | Admitting: Internal Medicine

## 2018-09-20 ENCOUNTER — Encounter (HOSPITAL_COMMUNITY)
Admission: RE | Disposition: A | Discharge status: Home / Self Care | Payer: Self-pay | Source: Home / Self Care | Attending: Internal Medicine

## 2018-09-20 ENCOUNTER — Ambulatory Visit (HOSPITAL_COMMUNITY): Payer: Managed Care, Other (non HMO) | Admitting: Anesthesiology

## 2018-09-20 ENCOUNTER — Other Ambulatory Visit: Payer: Self-pay

## 2018-09-20 ENCOUNTER — Encounter (HOSPITAL_COMMUNITY): Payer: Self-pay

## 2018-09-20 DIAGNOSIS — K449 Diaphragmatic hernia without obstruction or gangrene: Secondary | ICD-10-CM | POA: Insufficient documentation

## 2018-09-20 DIAGNOSIS — Z7982 Long term (current) use of aspirin: Secondary | ICD-10-CM | POA: Diagnosis not present

## 2018-09-20 DIAGNOSIS — Z79899 Other long term (current) drug therapy: Secondary | ICD-10-CM | POA: Diagnosis not present

## 2018-09-20 DIAGNOSIS — I2510 Atherosclerotic heart disease of native coronary artery without angina pectoris: Secondary | ICD-10-CM | POA: Insufficient documentation

## 2018-09-20 DIAGNOSIS — K529 Noninfective gastroenteritis and colitis, unspecified: Secondary | ICD-10-CM | POA: Diagnosis not present

## 2018-09-20 DIAGNOSIS — Z955 Presence of coronary angioplasty implant and graft: Secondary | ICD-10-CM | POA: Diagnosis not present

## 2018-09-20 DIAGNOSIS — I252 Old myocardial infarction: Secondary | ICD-10-CM | POA: Diagnosis not present

## 2018-09-20 DIAGNOSIS — I509 Heart failure, unspecified: Secondary | ICD-10-CM | POA: Diagnosis not present

## 2018-09-20 DIAGNOSIS — K219 Gastro-esophageal reflux disease without esophagitis: Secondary | ICD-10-CM | POA: Insufficient documentation

## 2018-09-20 DIAGNOSIS — I110 Hypertensive heart disease with heart failure: Secondary | ICD-10-CM | POA: Insufficient documentation

## 2018-09-20 DIAGNOSIS — Z1211 Encounter for screening for malignant neoplasm of colon: Secondary | ICD-10-CM

## 2018-09-20 DIAGNOSIS — R1310 Dysphagia, unspecified: Secondary | ICD-10-CM | POA: Diagnosis present

## 2018-09-20 HISTORY — PX: COLONOSCOPY WITH PROPOFOL: SHX5780

## 2018-09-20 HISTORY — PX: ESOPHAGOGASTRODUODENOSCOPY (EGD) WITH PROPOFOL: SHX5813

## 2018-09-20 HISTORY — PX: MALONEY DILATION: SHX5535

## 2018-09-20 HISTORY — PX: BIOPSY: SHX5522

## 2018-09-20 SURGERY — COLONOSCOPY WITH PROPOFOL
Anesthesia: Monitor Anesthesia Care

## 2018-09-20 MED ORDER — LABETALOL HCL 5 MG/ML IV SOLN
INTRAVENOUS | Status: AC
  Filled 2018-09-20: qty 4

## 2018-09-20 MED ORDER — PROPOFOL 500 MG/50ML IV EMUL
INTRAVENOUS | Status: DC | PRN
  Administered 2018-09-20: 150 ug/kg/min via INTRAVENOUS
  Administered 2018-09-20 (×2): via INTRAVENOUS

## 2018-09-20 MED ORDER — PROPOFOL 10 MG/ML IV BOLUS
INTRAVENOUS | Status: DC | PRN
  Administered 2018-09-20 (×8): 20 mg via INTRAVENOUS
  Administered 2018-09-20: 30 mg via INTRAVENOUS

## 2018-09-20 MED ORDER — KETAMINE HCL 50 MG/ML IJ SOLN
INTRAMUSCULAR | Status: DC | PRN
  Administered 2018-09-20 (×3): 5 mg via INTRAMUSCULAR
  Administered 2018-09-20: 10 mg via INTRAMUSCULAR

## 2018-09-20 MED ORDER — KETAMINE HCL 50 MG/5ML IJ SOSY
PREFILLED_SYRINGE | INTRAMUSCULAR | Status: AC
  Filled 2018-09-20: qty 5

## 2018-09-20 MED ORDER — LACTATED RINGERS IV SOLN
INTRAVENOUS | Status: DC
  Administered 2018-09-20: 10:00:00 via INTRAVENOUS

## 2018-09-20 MED ORDER — PROPOFOL 10 MG/ML IV BOLUS
INTRAVENOUS | Status: AC
  Filled 2018-09-20: qty 20

## 2018-09-20 MED ORDER — CHLORHEXIDINE GLUCONATE CLOTH 2 % EX PADS: 6.0000 | MEDICATED_PAD | Freq: Once | CUTANEOUS | Status: DC

## 2018-09-20 NOTE — Op Note (Signed)
[facility] Patient Name: Erin [name] Procedure Date: 09/20/2018 10:03 AM MRN: [id number] Date of Birth: October 26, 1960 Attending MD: Norvel Richards , MD CSN: [id number] Age: 58 Admit Type: Outpatient Procedure:                Colonoscopy Indications:              Chronic diarrhea Providers:                Norvel Richards, MD, Jeanann Lewandowsky. Sharon Seller, RN,                            Raphael Gibney, Technician Referring MD:             Teodora Medici. Kim Medicines:                Propofol per Anesthesia Complications:            No immediate complications. Estimated Blood Loss:     Estimated blood loss was minimal. Procedure:                Pre-Anesthesia Assessment:                           - Prior to the procedure, a History and Physical                            was performed, and patient medications and                            allergies were reviewed. The patient's tolerance of                            previous anesthesia was also reviewed. The risks                            and benefits of the procedure and the sedation                            options and risks were discussed with the patient.                            All questions were answered, and informed consent                            was obtained. Prior Anticoagulants: The patient has                            taken no previous anticoagulant or antiplatelet                            agents. ASA Grade Assessment: II - A patient with                            mild systemic disease. After reviewing the risks  and benefits, the patient was deemed in                            satisfactory condition to undergo the procedure.                           After obtaining informed consent, the colonoscope                            was passed under direct vision. Throughout the                            procedure, the patient's blood pressure, pulse, and   oxygen saturations were monitored continuously. The                            [id number]-[id number] ([id number]) scope was introduced through                            the and advanced to the 5 cm into the ileum. The                            terminal ileum, ileocecal valve, appendiceal                            orifice, and rectum were photographed. Scope In: 10:08:01 AM Scope Out: 10:23:58 AM Scope Withdrawal Time: 0 hours 2 minutes 22 seconds  Total Procedure Duration: 0 hours 15 minutes 57 seconds  Findings:      The perianal and digital rectal examinations were normal.      The colon (entire examined portion) appeared normal aside from a 2 cm       yellowish submucosal nodule in the ascending segment consistent with a       lipoma. Positive pillow sign. Distal 5 cm of TI appeared normal.       Segmental biopsies of right and left colon taken to evaluate for       microscopic colitis. Impression:               - The entire examined colon is normal (colonic                            lipoma); status post segmental biopsy. Normal                            terminal ileum. Query diarrhea secondary to                            postinfectious IBS; R/O microscopic colitis.                           - Moderate Sedation:      Moderate (conscious) sedation was personally administered by an       anesthesia professional. The following parameters were monitored: oxygen       saturation, heart rate, blood pressure, respiratory rate, EKG, adequacy       of pulmonary  ventilation, and response to care. Recommendation:           - Patient has a contact number available for                            emergencies. The signs and symptoms of potential                            delayed complications were discussed with the                            patient. Return to normal activities tomorrow.                            Written discharge instructions were provided to the                            patient.                            - Advance diet as tolerated.                           - Repeat colonoscopy in 10 years for screening                            purposes.                           - Return to GI office (date not yet determined). Procedure Code(s):        --- Professional ---                           [phone], Colonoscopy, flexible; diagnostic, including                            collection of specimen(s) by brushing or washing,                            when performed (separate procedure) Diagnosis Code(s):        --- Professional ---                           K52.9, Noninfective gastroenteritis and colitis,                            unspecified CPT copyright 2019 American Medical Association. All rights reserved. The codes documented in this report are preliminary and upon coder review may  be revised to meet current compliance requirements. Cristopher Estimable. Maricela Kawahara, MD Norvel Richards, MD 09/20/2018 10:31:51 AM This report has been signed electronically. Number of Addenda: 0

## 2018-09-20 NOTE — Anesthesia Postprocedure Evaluation (Signed)
Anesthesia Post Note  Patient: [place] J Mukai  Procedure(s) Performed: COLONOSCOPY WITH PROPOFOL (N/A ) ESOPHAGOGASTRODUODENOSCOPY (EGD) WITH PROPOFOL (N/A ) MALONEY DILATION (N/A ) BIOPSY  Patient location during evaluation: PACU Anesthesia Type: MAC Level of consciousness: awake and alert and oriented Pain management: pain level controlled Vital Signs Assessment: post-procedure vital signs reviewed and stable Respiratory status: spontaneous breathing Cardiovascular status: blood pressure returned to baseline : slight nausea but denies need for antiemetic. Anesthetic complications: no     Last Vitals:  Vitals:   09/20/18 0822 09/20/18 1032  BP:  120/81  Pulse: 96 90  Resp: (!) 22 14  Temp: 36.7 C 36.4 C  SpO2: 98% 100%    Last Pain:  Vitals:   09/20/18 1032  TempSrc:   PainSc: 0-No pain                 Cavion Faiola

## 2018-09-20 NOTE — Anesthesia Postprocedure Evaluation (Signed)
Anesthesia Post Note  Patient: Erin [name]  Procedure(s) Performed: COLONOSCOPY WITH PROPOFOL (N/A ) ESOPHAGOGASTRODUODENOSCOPY (EGD) WITH PROPOFOL (N/A ) MALONEY DILATION (N/A ) BIOPSY  Patient location during evaluation: PACU Anesthesia Type: MAC Level of consciousness: awake and alert and oriented Pain management: pain level controlled Vital Signs Assessment: post-procedure vital signs reviewed and stable Respiratory status: spontaneous breathing Cardiovascular status: stable Postop Assessment: no apparent nausea or vomiting Anesthetic complications: no     Last Vitals:  Vitals:   09/20/18 1030 09/20/18 1032  BP: 120/81 120/81  Pulse: 88 90  Resp:  14  Temp:  36.4 C  SpO2:  100%    Last Pain:  Vitals:   09/20/18 1032  TempSrc:   PainSc: 0-No pain                 Ronny Korff

## 2018-09-20 NOTE — Discharge Instructions (Signed)
°Colonoscopy °Discharge Instructions ° °Read the instructions outlined below and refer to this sheet in the next few weeks. These discharge instructions provide you with general information on caring for yourself after you leave the hospital. Your doctor may also give you specific instructions. While your treatment has been planned according to the most current medical practices available, unavoidable complications occasionally occur. If you have any problems or questions after discharge, call Dr. Laurenashley Viar at [phone]. °ACTIVITY °· You may resume your regular activity, but move at a slower pace for the next 24 hours.  °· Take frequent rest periods for the next 24 hours.  °· Walking will help get rid of the air and reduce the bloated feeling in your belly (abdomen).  °· No driving for 24 hours (because of the medicine (anesthesia) used during the test).   °· Do not sign any important legal documents or operate any machinery for 24 hours (because of the anesthesia used during the test).  °NUTRITION °· Drink plenty of fluids.  °· You may resume your normal diet as instructed by your doctor.  °· Begin with a light meal and progress to your normal diet. Heavy or fried foods are harder to digest and may make you feel sick to your stomach (nauseated).  °· Avoid alcoholic beverages for 24 hours or as instructed.  °MEDICATIONS °· You may resume your normal medications unless your doctor tells you otherwise.  °WHAT YOU CAN EXPECT TODAY °· Some feelings of bloating in the abdomen.  °· Passage of more gas than usual.  °· Spotting of blood in your stool or on the toilet paper.  °IF YOU HAD POLYPS REMOVED DURING THE COLONOSCOPY: °· No aspirin products for 7 days or as instructed.  °· No alcohol for 7 days or as instructed.  °· Eat a soft diet for the next 24 hours.  °FINDING OUT THE RESULTS OF YOUR TEST °Not all test results are available during your visit. If your test results are not back during the visit, make an appointment  with your caregiver to find out the results. Do not assume everything is normal if you have not heard from your caregiver or the medical facility. It is important for you to follow up on all of your test results.  °SEEK IMMEDIATE MEDICAL ATTENTION IF: °· You have more than a spotting of blood in your stool.  °· Your belly is swollen (abdominal distention).  °· You are nauseated or vomiting.  °· You have a temperature over 101.  °· You have abdominal pain or discomfort that is severe or gets worse throughout the day.  ° ° °EGD °Discharge instructions °Please read the instructions outlined below and refer to this sheet in the next few weeks. These discharge instructions provide you with general information on caring for yourself after you leave the hospital. Your doctor may also give you specific instructions. While your treatment has been planned according to the most current medical practices available, unavoidable complications occasionally occur. If you have any problems or questions after discharge, please call your doctor. °ACTIVITY °· You may resume your regular activity but move at a slower pace for the next 24 hours.  °· Take frequent rest periods for the next 24 hours.  °· Walking will help expel (get rid of) the air and reduce the bloated feeling in your abdomen.  °· No driving for 24 hours (because of the anesthesia (medicine) used during the test).  °· You may shower.  °· Do not sign   any important legal documents or operate any machinery for 24 hours (because of the anesthesia used during the test).  °NUTRITION °· Drink plenty of fluids.  °· You may resume your normal diet.  °· Begin with a light meal and progress to your normal diet.  °· Avoid alcoholic beverages for 24 hours or as instructed by your caregiver.  °MEDICATIONS °· You may resume your normal medications unless your caregiver tells you otherwise.  °WHAT YOU CAN EXPECT TODAY °· You may experience abdominal discomfort such as a feeling of  fullness or “gas” pains.  °FOLLOW-UP °· Your doctor will discuss the results of your test with you.  °SEEK IMMEDIATE MEDICAL ATTENTION IF ANY OF THE FOLLOWING OCCUR: °· Excessive nausea (feeling sick to your stomach) and/or vomiting.  °· Severe abdominal pain and distention (swelling).  °· Trouble swallowing.  °· Temperature over 101° F (37.8º C).  °· Rectal bleeding or vomiting of blood.  ° ° °Further recommendations to follow pending review of pathology report ° °

## 2018-09-20 NOTE — Anesthesia Preprocedure Evaluation (Signed)
Anesthesia Evaluation  Patient identified by MRN, date of birth, ID band Patient awake    Reviewed: Allergy & Precautions, H&P , NPO status , Patient's Chart, lab work & pertinent test results, reviewed documented beta blocker date and time   Airway Mallampati: II  TM Distance: >3 FB Neck ROM: full    Dental  (+) Edentulous Upper   Pulmonary neg pulmonary ROS, Current Smoker,    Pulmonary exam normal breath sounds clear to auscultation       Cardiovascular Exercise Tolerance: Good hypertension, + CAD, + Past MI, + Cardiac Stents and +CHF   Rhythm:regular Rate:Normal     Neuro/Psych PSYCHIATRIC DISORDERS Anxiety negative neurological ROS     GI/Hepatic Neg liver ROS, GERD  ,  Endo/Other  negative endocrine ROS  Renal/GU negative Renal ROS  negative genitourinary   Musculoskeletal   Abdominal   Peds  Hematology  (+) Blood dyscrasia, anemia ,   Anesthesia Other Findings   Reproductive/Obstetrics negative OB ROS                             Anesthesia Physical Anesthesia Plan  ASA: III  Anesthesia Plan: MAC   Post-op Pain Management:    Induction:   PONV Risk Score and Plan:   Airway Management Planned:   Additional Equipment:   Intra-op Plan:   Post-operative Plan:   Informed Consent: I have reviewed the patients History and Physical, chart, labs and discussed the procedure including the risks, benefits and alternatives for the proposed anesthesia with the patient or authorized representative who has indicated his/her understanding and acceptance.     Dental Advisory Given  Plan Discussed with: CRNA  Anesthesia Plan Comments:         Anesthesia Quick Evaluation

## 2018-09-20 NOTE — Transfer of Care (Signed)
Immediate Anesthesia Transfer of Care Note  Patient: [name]  Procedure(s) Performed: COLONOSCOPY WITH PROPOFOL (N/A ) ESOPHAGOGASTRODUODENOSCOPY (EGD) WITH PROPOFOL (N/A ) MALONEY DILATION (N/A ) BIOPSY  Patient Location: PACU  Anesthesia Type:MAC  Level of Consciousness: awake  Airway & Oxygen Therapy: Patient Spontanous Breathing  Post-op Assessment: Report given to RN  Post vital signs: Reviewed and stable  Last Vitals:  Vitals Value Taken Time  BP 120/81 09/20/2018 10:31 AM  Temp 36.4 C 09/20/2018 10:32 AM  Pulse 91 09/20/2018 10:33 AM  Resp 18 09/20/2018 10:33 AM  SpO2 100 % 09/20/2018 10:33 AM  Vitals shown include unvalidated device data.  Last Pain:  Vitals:   09/20/18 0945  TempSrc:   PainSc: 0-No pain         Complications: No apparent anesthesia complications

## 2018-09-20 NOTE — Op Note (Signed)
[facility] Patient Name: Erin [name] Procedure Date: 09/20/2018 9:36 AM MRN: [id number] Date of Birth: [birth year] Attending MD: Norvel Richards , MD CSN: [id number] Age: 58 Admit Type: Outpatient Procedure:                Upper GI endoscopy Indications:              Dysphagia Providers:                Norvel Richards, MD, Jeanann Lewandowsky. Sharon Seller, RN,                            Raphael Gibney, Technician Referring MD:              Medicines:                Propofol per Anesthesia Complications:            No immediate complications. Estimated Blood Loss:     Estimated blood loss: none. Procedure:                Pre-Anesthesia Assessment:                           - Prior to the procedure, a History and Physical                            was performed, and patient medications and                            allergies were reviewed. The patient's tolerance of                            previous anesthesia was also reviewed. The risks                            and benefits of the procedure and the sedation                            options and risks were discussed with the patient.                            All questions were answered, and informed consent                            was obtained. Prior Anticoagulants: The patient has                            taken no previous anticoagulant or antiplatelet                            agents. ASA Grade Assessment: II - A patient with                            mild systemic disease. After reviewing the risks  and benefits, the patient was deemed in                            satisfactory condition to undergo the procedure.                           After obtaining informed consent, the endoscope was                            passed under direct vision. Throughout the                            procedure, the patient's blood pressure, pulse, and                            oxygen saturations were  monitored continuously. The                            [id number]-H190 ([id number]) scope was introduced through the                            and advanced to the second part of duodenum. The                            upper GI endoscopy was accomplished without                            difficulty. The patient tolerated the procedure                            well. Scope In: 9:53:27 AM Scope Out: 9:58:24 AM Total Procedure Duration: 0 hours 4 minutes 57 seconds  Findings:      The examined esophagus was normal.      A small hiatal hernia was present.      The exam of the stomach was otherwise normal.      The duodenal bulb and second portion of the duodenum were normal. The       scope was withdrawn. Dilation was performed with a Maloney dilator with       mild resistance at 67 Fr. The dilation site was examined following       endoscope reinsertion and showed no change. Estimated blood loss: none. Impression:               - Normal esophagus. Dilated.                           - Small hiatal hernia.                           - Normal duodenal bulb and second portion of the                            duodenum.                           - No specimens collected.  Moderate Sedation:      Moderate (conscious) sedation was personally administered by an       anesthesia professional. The following parameters were monitored: oxygen       saturation, heart rate, blood pressure, respiratory rate, EKG, adequacy       of pulmonary ventilation, and response to care. Recommendation:           - Patient has a contact number available for                            emergencies. The signs and symptoms of potential                            delayed complications were discussed with the                            patient. Return to normal activities tomorrow.                            Written discharge instructions were provided to the                            patient.                           -  Advance diet as tolerated.                           - Continue present medications. See colonoscopy                            report. Procedure Code(s):        --- Professional ---                           [phone], Esophagogastroduodenoscopy, flexible,                            transoral; diagnostic, including collection of                            specimen(s) by brushing or washing, when performed                            (separate procedure)                           43450, Dilation of esophagus, by unguided sound or                            bougie, single or multiple passes Diagnosis Code(s):        --- Professional ---                           K44.9, Diaphragmatic hernia without obstruction or  gangrene                           R13.10, Dysphagia, unspecified CPT copyright 2019 [employer]. All rights reserved. The codes documented in this report are preliminary and upon coder review may  be revised to meet current compliance requirements. Cristopher Estimable. Miraya Cudney, MD Norvel Richards, MD 09/20/2018 10:05:53 AM This report has been signed electronically. Number of Addenda: 0

## 2018-09-20 NOTE — Interval H&P Note (Signed)
History and Physical Interval Note:  09/20/2018 9:32 AM  Erin [name]  has presented today for surgery, with the diagnosis of dysphagia, screening colonoscopy.  The various methods of treatment have been discussed with the patient and family. After consideration of risks, benefits and other options for treatment, the patient has consented to  Procedure(s) with comments: COLONOSCOPY WITH PROPOFOL (N/A) - 10:30am ESOPHAGOGASTRODUODENOSCOPY (EGD) WITH PROPOFOL (N/A) MALONEY DILATION (N/A) as a surgical intervention.  The patient's history has been reviewed, patient examined, no change in status, stable for surgery.  I have reviewed the patient's chart and labs.  Questions were answered to the patient's satisfaction.     Edword Cu  No change persisting diarrhea and dysphagia.  EGD/ED and colonoscopy per plan.  The risks, benefits, limitations, imponderables and alternatives regarding both EGD and colonoscopy have been reviewed with the patient. Questions have been answered. All parties agreeable.

## 2018-09-22 ENCOUNTER — Encounter: Payer: Self-pay | Admitting: Internal Medicine

## 2018-09-24 ENCOUNTER — Encounter (HOSPITAL_COMMUNITY): Payer: Self-pay | Admitting: Internal Medicine

## 2018-09-24 ENCOUNTER — Telehealth: Payer: Self-pay

## 2018-09-24 NOTE — Telephone Encounter (Signed)
Letter and IBS info mailed to the pt.

## 2018-09-24 NOTE — Telephone Encounter (Signed)
Per RMR-  Send letter to patient.  Send copy of letter with path to referring provider and PCP.    Offer f/U appt w AB 3 mos; send info on IBS

## 2018-10-03 ENCOUNTER — Ambulatory Visit: Payer: Managed Care, Other (non HMO) | Admitting: Gastroenterology

## 2018-10-03 NOTE — Telephone Encounter (Signed)
Reminder in epic °

## 2018-10-05 ENCOUNTER — Other Ambulatory Visit (HOSPITAL_COMMUNITY): Payer: Managed Care, Other (non HMO)

## 2018-10-05 ENCOUNTER — Telehealth: Payer: Self-pay | Admitting: Cardiovascular Disease

## 2018-10-05 NOTE — Telephone Encounter (Signed)
Patient called requesting another card to help pay for her sacubitril-valsartan [facility]) 24-26 MG [[id number]]  Please call [phone].

## 2018-10-05 NOTE — Telephone Encounter (Signed)
Called pt to get more information. No answer, left message for pt to return call.

## 2018-10-10 NOTE — Telephone Encounter (Signed)
Called pt. No answer. Left message for pt to return call.  

## 2018-10-11 NOTE — Telephone Encounter (Signed)
PT called to inform us that she has switched jobs and does not have health insurance. She cannot afford Entresto at this time, can she get another medication until her insurance takes effect. Please advise.

## 2018-10-11 NOTE — Telephone Encounter (Signed)
Try losartan 25 mg daily. See if we can get her some samples of Entresto too.

## 2018-10-12 MED ORDER — LOSARTAN POTASSIUM 25 MG PO TABS: 25.0000 mg | ORAL_TABLET | Freq: Every day | ORAL | 3 refills | Status: DC

## 2018-10-12 NOTE — Telephone Encounter (Signed)
Called pt to inform of medication change. She will finish whatever entresto she has left and switch.

## 2018-10-23 ENCOUNTER — Encounter (HOSPITAL_COMMUNITY): Payer: Self-pay | Admitting: Emergency Medicine

## 2018-10-23 ENCOUNTER — Other Ambulatory Visit: Payer: Self-pay

## 2018-10-23 ENCOUNTER — Emergency Department (HOSPITAL_COMMUNITY)
Admission: EM | Admit: 2018-10-23 | Discharge: 2018-10-23 | Disposition: A | Discharge status: Home / Self Care | Payer: Managed Care, Other (non HMO) | Attending: Emergency Medicine | Admitting: Emergency Medicine

## 2018-10-23 ENCOUNTER — Emergency Department (HOSPITAL_COMMUNITY): Payer: Managed Care, Other (non HMO)

## 2018-10-23 DIAGNOSIS — Z7982 Long term (current) use of aspirin: Secondary | ICD-10-CM | POA: Insufficient documentation

## 2018-10-23 DIAGNOSIS — I5042 Chronic combined systolic (congestive) and diastolic (congestive) heart failure: Secondary | ICD-10-CM | POA: Insufficient documentation

## 2018-10-23 DIAGNOSIS — R112 Nausea with vomiting, unspecified: Secondary | ICD-10-CM | POA: Insufficient documentation

## 2018-10-23 DIAGNOSIS — R079 Chest pain, unspecified: Secondary | ICD-10-CM | POA: Insufficient documentation

## 2018-10-23 DIAGNOSIS — Z79899 Other long term (current) drug therapy: Secondary | ICD-10-CM | POA: Insufficient documentation

## 2018-10-23 DIAGNOSIS — I2510 Atherosclerotic heart disease of native coronary artery without angina pectoris: Secondary | ICD-10-CM | POA: Insufficient documentation

## 2018-10-23 DIAGNOSIS — R197 Diarrhea, unspecified: Secondary | ICD-10-CM | POA: Insufficient documentation

## 2018-10-23 DIAGNOSIS — F17210 Nicotine dependence, cigarettes, uncomplicated: Secondary | ICD-10-CM | POA: Insufficient documentation

## 2018-10-23 DIAGNOSIS — Z85828 Personal history of other malignant neoplasm of skin: Secondary | ICD-10-CM | POA: Insufficient documentation

## 2018-10-23 DIAGNOSIS — I110 Hypertensive heart disease with heart failure: Secondary | ICD-10-CM | POA: Insufficient documentation

## 2018-10-23 DIAGNOSIS — R1010 Upper abdominal pain, unspecified: Secondary | ICD-10-CM | POA: Insufficient documentation

## 2018-10-23 LAB — COMPREHENSIVE METABOLIC PANEL
ALT: 8 U/L (ref 0–44)
AST: 23 U/L (ref 15–41)
Albumin: 3.8 g/dL (ref 3.5–5.0)
Alkaline Phosphatase: 70 U/L (ref 38–126)
Anion gap: 14 (ref 5–15)
BUN: 15 mg/dL (ref 6–20)
CO2: 22 mmol/L (ref 22–32)
Calcium: 9.5 mg/dL (ref 8.9–10.3)
Chloride: 104 mmol/L (ref 98–111)
Creatinine, Ser: 0.86 mg/dL (ref 0.44–1.00)
GFR calc Af Amer: >60 mL/min (ref >60)
GFR calc non Af Amer: >60 mL/min (ref >60)
Glucose, Bld: 117 mg/dL — ABNORMAL HIGH (ref 70–99)
Potassium: 3.4 mmol/L — ABNORMAL LOW (ref 3.5–5.1)
Sodium: 140 mmol/L (ref 135–145)
Total Bilirubin: 0.8 mg/dL (ref 0.3–1.2)
Total Protein: 6.9 g/dL (ref 6.5–8.1)

## 2018-10-23 LAB — CBC WITH DIFFERENTIAL/PLATELET
Abs Immature Granulocytes: 0.04 10*3/uL (ref 0.00–0.07)
Basophils Absolute: 0 10*3/uL (ref 0.0–0.1)
Basophils Relative: 1 %
Eosinophils Absolute: 0.1 10*3/uL (ref 0.0–0.5)
Eosinophils Relative: 1 %
HCT: 33.7 % — ABNORMAL LOW (ref 36.0–46.0)
Hemoglobin: 11.1 g/dL — ABNORMAL LOW (ref 12.0–15.0)
Immature Granulocytes: 1 %
Lymphocytes Relative: 24 %
Lymphs Abs: 1.9 10*3/uL (ref 0.7–4.0)
MCH: 28.9 pg (ref 26.0–34.0)
MCHC: 32.9 g/dL (ref 30.0–36.0)
MCV: 87.8 fL (ref 80.0–100.0)
Monocytes Absolute: 0.6 10*3/uL (ref 0.1–1.0)
Monocytes Relative: 8 %
Neutro Abs: 5.4 10*3/uL (ref 1.7–7.7)
Neutrophils Relative %: 65 %
Platelets: 395 10*3/uL (ref 150–400)
RBC: 3.84 MIL/uL — ABNORMAL LOW (ref 3.87–5.11)
RDW: 16.4 % — ABNORMAL HIGH (ref 11.5–15.5)
WBC: 8.1 10*3/uL (ref 4.0–10.5)
nRBC: 0 % (ref 0.0–0.2)

## 2018-10-23 LAB — TROPONIN I
Troponin I: <0.03 ng/mL (ref <0.03)
Troponin I: <0.03 ng/mL (ref <0.03)

## 2018-10-23 LAB — LACTIC ACID, PLASMA
Lactic Acid, Venous: 1.9 mmol/L (ref 0.5–1.9)
Lactic Acid, Venous: 0.9 mmol/L (ref 0.5–1.9)

## 2018-10-23 LAB — BRAIN NATRIURETIC PEPTIDE: B Natriuretic Peptide: 119 pg/mL — ABNORMAL HIGH (ref 0.0–100.0)

## 2018-10-23 LAB — LIPASE, BLOOD: Lipase: 51 U/L (ref 11–51)

## 2018-10-23 MED ORDER — ONDANSETRON HCL 4 MG/2ML IJ SOLN
4.0000 mg | Freq: Once | INTRAMUSCULAR | Status: AC
  Administered 2018-10-23: 4 mg via INTRAVENOUS
  Filled 2018-10-23: qty 2

## 2018-10-23 MED ORDER — SODIUM CHLORIDE 0.9 % IV BOLUS
1000.0000 mL | Freq: Once | INTRAVENOUS | Status: AC
  Administered 2018-10-23: 1000 mL via INTRAVENOUS

## 2018-10-23 MED ORDER — ALUM & MAG HYDROXIDE-SIMETH 200-200-20 MG/5ML PO SUSP
30.0000 mL | Freq: Once | ORAL | Status: AC
  Administered 2018-10-23: 30 mL via ORAL
  Filled 2018-10-23: qty 30

## 2018-10-23 MED ORDER — IOHEXOL 300 MG/ML  SOLN
100.0000 mL | Freq: Once | INTRAMUSCULAR | Status: AC | PRN
  Administered 2018-10-23: 100 mL via INTRAVENOUS

## 2018-10-23 MED ORDER — LIDOCAINE VISCOUS HCL 2 % MT SOLN
15.0000 mL | Freq: Once | OROMUCOSAL | Status: AC
  Administered 2018-10-23: 15 mL via ORAL
  Filled 2018-10-23: qty 15

## 2018-10-23 MED ORDER — ONDANSETRON 4 MG PO TBDP: 4.0000 mg | ORAL_TABLET | Freq: Three times a day (TID) | ORAL | 1 refills | Status: DC | PRN

## 2018-10-23 NOTE — ED Provider Notes (Signed)
[facility] EMERGENCY DEPARTMENT Provider Note   CSN: [id number] Arrival date & time: 10/23/18  9562     History   Chief Complaint Chief Complaint  Patient presents with  . Chest Pain    HPI Erin [name] is a 58 y.o. female.     58 y.o. female with past medical history of CAD (s/p DES to LCx with staged PCI/DESx2 to RCA/PDA in 03/2016, NST in 03/2017 showing large scar with minimal peri-infarct ischemia), chronic combined systolic and diastolic CHF, HTN, and HLD.  She presents with central chest pain rating to her back that onset at 430 this morning after vomiting.  She reports the pain is constant and somewhat improved when she lies on her left side.  She is not had this kind of pain in the past and that is dissimilar to her previous MI.  She denies cough, shortness of breath, fever, diaphoresis.  Reports she has had nausea and vomiting for the past 3 days about 3 episodes daily as well as fever to 101.  States she has had diarrhea chronically for the past 2 months has been seen by gastroenterology without a diagnosis.  States her diarrhea has been 6 or 7 times daily.  She had a colonoscopy and EGD last month they were reassuring by her report.  Diarrhea is unchanged but the vomiting is new.  The chest pain came on after vomiting and patient is concerned that she could have strained something while vomiting.  Denies any pain with urination or blood in the urine.  The history is provided by the patient.  Chest Pain Associated symptoms: abdominal pain, fever, nausea, vomiting and weakness   Associated symptoms: no cough, no dizziness, no fatigue, no headache and no shortness of breath     Past Medical History:  Diagnosis Date  . Anxiety state 03/25/2016  . Basal cell carcinoma of forehead   . CHF (congestive heart failure) ([place])   . Coronary artery disease    a. 03/11/16 PCI with DES-->Prox/Mid Cx;  b. 03/14/16 PCI with DES x2-->RCA, EF 30-35%.  . Essential hypertension   . GERD  (gastroesophageal reflux disease)   . HFrEF (heart failure with reduced ejection fraction) ([place])    a. 10/2016 Echo: EF 35-40%, Gr1 DD, mild focal basal septal hypertrophy, basal inflat, mid inflat, basal antlat AK. Mid infept/inf/antlat, apical lateral sev HK. Mod MR. mildly reduced RV fxn. Mild TR.  [name] Kitchen History of pneumonia   . Hyperlipidemia   . Ischemic cardiomyopathy    a. 10/2016 Echo: EF 35-40%, Gr1 DD.  [name] Kitchen Mitral regurgitation   . NSTEMI (non-ST elevated myocardial infarction) ([place]) 03/10/2016  . Squamous cell cancer of skin of nose   . Thrombocytosis ([place]) 03/26/2016  . Tobacco abuse   . Trichimoniasis   . Wears dentures   . Wears glasses     Patient Active Problem List   Diagnosis Date Noted  . Dysphagia 02/27/2018  . Encounter for screening colonoscopy 02/27/2018  . History of Clostridium difficile infection 02/27/2018  . Frequent stools 12/20/2017  . Chronic combined systolic and diastolic CHF (congestive heart failure) ([place]) 06/20/2016  . Hypokalemia   . Acute CHF (congestive heart failure) ([place]) 05/16/2016  . Acute on chronic systolic CHF (congestive heart failure) ([place]) 05/16/2016  . Acute respiratory failure with hypoxia ([place])   . Thrombocytosis ([place]) 03/26/2016  . Cardiomyopathy, ischemic 03/25/2016  . Chronic combined systolic and diastolic heart failure ([place]) 03/25/2016  . Anxiety state 03/25/2016  . Troponin level  elevated 03/25/2016  . CAD (coronary artery disease), native coronary artery 03/25/2016  . Normocytic anemia 03/25/2016  . SOB (shortness of breath) 03/24/2016  . Lightheadedness 03/17/2016  . Hypotension 03/17/2016  . Tobacco abuse 03/12/2016  . NSTEMI (non-ST elevated myocardial infarction) ([place]) 03/11/2016  . Pain in the chest   . Essential hypertension 09/06/2015  . Mixed hyperlipidemia 09/06/2015  . GERD (gastroesophageal reflux disease) 09/06/2015  . Chest pain 09/06/2015    Past Surgical History:  Procedure Laterality Date  .  APPENDECTOMY    . BIOPSY  09/20/2018   Procedure: BIOPSY;  Surgeon: Daneil Dolin, MD;  [facility];  Service: Endoscopy;;  colon  . CARDIAC CATHETERIZATION N/A 03/11/2016   Procedure: Left Heart Cath and Coronary Angiography;  Surgeon: Leonie Man, MD;  Location: [place] CV LAB;  Service: Cardiovascular;  Laterality: N/A;  . CARDIAC CATHETERIZATION N/A 03/11/2016   Procedure: Coronary Stent Intervention;  Surgeon: Leonie Man, MD;  Location: [place] [facility] LAB;  Service: Cardiovascular;  Laterality: N/A;  . CARDIAC CATHETERIZATION N/A 03/14/2016   Procedure: Coronary Stent Intervention;  Surgeon: Peter M Martinique, MD;  Location: [place] CV LAB;  Service: Cardiovascular;  Laterality: N/A;  . CHOLECYSTECTOMY OPEN  1984  . COLONOSCOPY WITH PROPOFOL N/A 09/20/2018   Procedure: COLONOSCOPY WITH PROPOFOL;  Surgeon: Daneil Dolin, MD;  [facility];  Service: Endoscopy;  Laterality: N/A;  10:30am  . CORONARY ANGIOPLASTY WITH STENT PLACEMENT  03/14/2016  . ESOPHAGOGASTRODUODENOSCOPY (EGD) WITH PROPOFOL N/A 09/20/2018   Procedure: ESOPHAGOGASTRODUODENOSCOPY (EGD) WITH PROPOFOL;  Surgeon: Daneil Dolin, MD;  [facility];  Service: Endoscopy;  Laterality: N/A;  . FINGER ARTHROPLASTY Left 05/14/2013   Procedure: LEFT THUMB CARPAL METACARPAL ARTHROPLASTY;  Surgeon: Tennis Must, MD;  Location: [name];  Service: Orthopedics;  Laterality: Left;  . IR ANGIO INTRA EXTRACRAN SEL COM CAROTID INNOMINATE BILAT MOD SED  01/05/2017  . IR ANGIO VERTEBRAL SEL VERTEBRAL BILAT MOD SED  01/05/2017  . IR RADIOLOGIST EVAL & MGMT  12/30/2016  . MALONEY DILATION N/A 09/20/2018   Procedure: Venia Minks DILATION;  Surgeon: Daneil Dolin, MD;  [facility];  Service: Endoscopy;  Laterality: N/A;  . TUBAL LIGATION  1987  . VAGINAL HYSTERECTOMY  2009     OB History    Gravida  4   Para  4   Term  4   Preterm      AB      Living  4     SAB       TAB      Ectopic      Multiple      Live Births               Home Medications    Prior to Admission medications   Medication Sig Start Date End Date Taking? Authorizing Provider  acetaminophen (TYLENOL) 325 MG tablet Take 325 mg by mouth every 6 (six) hours as needed for mild pain or headache.     [provider]  ALPRAZolam Duanne Moron) 1 MG tablet Take 1 mg by mouth at bedtime.     [provider]  aspirin EC 81 MG tablet Take 81 mg by mouth every evening.     [provider]  atorvastatin (LIPITOR) 80 MG tablet TAKE 1 TABLET BY MOUTH ONCE DAILY AT  6  IN  THE  EVENING Patient taking differently: Take 80 mg by mouth every  evening.  04/03/18   Herminio Commons, MD  cholestyramine light (PREVALITE) 4 g packet Take 0.5 packets (2 g total) by mouth daily. 09/11/18   Annitta Needs, NP  dicyclomine (BENTYL) 10 MG capsule Take 1 capsule (10 mg total) by mouth 4 (four) times daily -  before meals and at bedtime. 09/05/18   Annitta Needs, NP  ezetimibe (ZETIA) 10 MG tablet Take 1 tablet by mouth once daily 08/08/18   Herminio Commons, MD  furosemide (LASIX) 20 MG tablet Take 1 tablet (20 mg total) by mouth daily. 05/16/18   Herminio Commons, MD  gabapentin (NEURONTIN) 100 MG capsule Take 100 mg by mouth 3 (three) times daily.    [provider]  isosorbide mononitrate (IMDUR) 30 MG 24 hr tablet Take 1 tablet by mouth once daily 08/09/18   Herminio Commons, MD  Loperamide HCl (IMODIUM PO) Take by mouth.    [provider]  LORazepam (ATIVAN) 1 MG tablet Take 1 mg by mouth daily as needed for anxiety.  11/09/17   [provider]  losartan (COZAAR) 25 MG tablet Take 1 tablet (25 mg total) by mouth daily. 10/12/18 01/10/19  Herminio Commons, MD  metoprolol succinate (TOPROL XL) 25 MG 24 hr tablet Take 0.5 tablets (12.5 mg total) by mouth daily. Patient taking differently: Take 12.5 mg by mouth daily. Hasn't taken in past week d/t low BP  11/17/17   Sherwood Gambler, MD  Multiple Vitamins-Minerals (MULTIVITAMIN WITH MINERALS) tablet Take 1 tablet by mouth daily.    [provider]  nicotine (NICODERM CQ - DOSED IN MG/24 HOURS) 21 mg/24hr patch Place 21 mg onto the skin daily.    [provider]  nitroGLYCERIN (NITROSTAT) 0.4 MG SL tablet Place 1 tablet (0.4 mg total) under the tongue every 5 (five) minutes x 3 doses as needed for chest pain. 03/05/18   Manuella Ghazi, Pratik D, DO  ondansetron (ZOFRAN) 4 MG tablet Take 1 tablet (4 mg total) by mouth every 8 (eight) hours as needed for nausea or vomiting. 08/22/18   Annitta Needs, NP  potassium chloride SA (KLOR-CON M20) 20 MEQ tablet Take 1 tablet (20 mEq total) by mouth daily. 04/11/18   Herminio Commons, MD  rOPINIRole (REQUIP) 0.5 MG tablet Take 0.5 mg by mouth at bedtime.  11/13/17   [provider]    Family History Family History  Problem Relation Age of Onset  . Diabetes Father   . Hypertension Father   . CAD Father   . Colon polyps Father 60       pre-cancerous   . Stroke Mother   . Hypertension Mother   . Diabetes Mother   . Heart failure Other   . Breast cancer Maternal Grandmother   . Colon cancer Neg Hx     Social History Social History   Tobacco Use  . Smoking status: Current Some Day Smoker    Packs/day: 0.10    Years: 15.00    Pack years: 1.50    Types: Cigarettes  . Smokeless tobacco: Never Used  . Tobacco comment: smokes a cigarette occasionally  Substance Use Topics  . Alcohol use: Yes    Comment: occasionally  . Drug use: Not Currently    Types: Marijuana    Comment: former     Allergies   Tape   Review of Systems Review of Systems  Constitutional: Positive for activity change, appetite change and fever. Negative for fatigue.  HENT: Negative  for congestion and rhinorrhea.   Respiratory: Positive for chest tightness. Negative for cough and shortness of breath.   Cardiovascular: Positive for chest pain.   Gastrointestinal: Positive for abdominal pain, diarrhea, nausea and vomiting.  Genitourinary: Negative for dysuria and hematuria.  Musculoskeletal: Positive for neck pain. Negative for arthralgias and myalgias.  Skin: Negative for rash.  Neurological: Positive for weakness. Negative for dizziness and headaches.    all other systems are negative except as noted in the HPI and PMH.    Physical Exam Updated Vital Signs BP 123/74   Pulse 86   Temp (!) 97.5 F (36.4 C) (Oral)   Resp (!) 27   SpO2 99%   Physical Exam Vitals signs and nursing note reviewed.  Constitutional:      General: She is not in acute distress.    Appearance: Normal appearance. She is well-developed and normal weight.  HENT:     Head: Normocephalic and atraumatic.     Mouth/Throat:     Pharynx: No oropharyngeal exudate.  Eyes:     Conjunctiva/sclera: Conjunctivae normal.     Pupils: Pupils are equal, round, and reactive to light.  Neck:     Musculoskeletal: Normal range of motion and neck supple.     Comments: No meningismus. Cardiovascular:     Rate and Rhythm: Normal rate and regular rhythm.     Heart sounds: Normal heart sounds. No murmur.  Pulmonary:     Effort: Pulmonary effort is normal. No respiratory distress.     Breath sounds: Normal breath sounds.  Abdominal:     Palpations: Abdomen is soft.     Tenderness: There is abdominal tenderness. There is no guarding or rebound.     Comments: Mild diffuse tenderness, worse in the epigastrium  Equal femoral pulses bilaterally  Musculoskeletal: Normal range of motion.        General: Tenderness present.     Comments: Right paraspinal lumbar tenderness  Skin:    General: Skin is warm.     Capillary Refill: Capillary refill takes less than 2 seconds.  Neurological:     General: No focal deficit present.     Mental Status: She is alert and oriented to person, place, and time. Mental status is at baseline.     Cranial Nerves: No cranial nerve  deficit.     Motor: No abnormal muscle tone.     Coordination: Coordination normal.     Comments:  5/5 strength throughout. CN 2-12 intact.Equal grip strength.   Psychiatric:        Behavior: Behavior normal.      ED Treatments / Results  Labs (all labs ordered are listed, but only abnormal results are displayed) Labs Reviewed  CBC WITH DIFFERENTIAL/PLATELET - Abnormal; Notable for the following components:      Result Value   RBC 3.84 (*)    Hemoglobin 11.1 (*)    HCT 33.7 (*)    RDW 16.4 (*)    All other components within normal limits  COMPREHENSIVE METABOLIC PANEL - Abnormal; Notable for the following components:   Potassium 3.4 (*)    Glucose, Bld 117 (*)    All other components within normal limits  BRAIN NATRIURETIC PEPTIDE - Abnormal; Notable for the following components:   B Natriuretic Peptide 119.0 (*)    All other components within normal limits  CULTURE, BLOOD (ROUTINE X 2)  CULTURE, BLOOD (ROUTINE X 2)  URINE CULTURE  C DIFFICILE QUICK SCREEN W PCR REFLEX  GASTROINTESTINAL PANEL  BY PCR, STOOL (REPLACES STOOL CULTURE)  LIPASE, BLOOD  TROPONIN I  LACTIC ACID, PLASMA  LACTIC ACID, PLASMA  URINALYSIS, ROUTINE W REFLEX MICROSCOPIC  TROPONIN I    EKG EKG Interpretation  Date/Time:  Tuesday October 23 2018 06:25:25 EDT Ventricular Rate:  89 PR Interval:    QRS Duration: 148 QT Interval:  348 QTC Calculation: 424 R Axis:   0 Text Interpretation:  Sinus rhythm Atrial premature complex Probable left atrial enlargement Nonspecific intraventricular conduction delay No significant change was found Confirmed by Ezequiel Essex [phone]) on 10/23/2018 6:39:27 AM   Radiology Dg Abdomen Acute W/chest  Result Date: 10/23/2018 CLINICAL DATA:  Chest pain.  Vomiting and diarrhea. EXAM: DG ABDOMEN ACUTE W/ 1V CHEST COMPARISON:  Chest x-ray dated 08/16/2018 and CT scan of the abdomen and pelvis dated 07/29/2017 FINDINGS: There is no evidence of dilated bowel loops or free  intraperitoneal air. No radiopaque calculi or other significant radiographic abnormality is seen. Surgical clips from previous cholecystectomy. Heart size and mediastinal contours are within normal limits. Both lungs are clear. IMPRESSION: Negative abdominal radiographs.  No acute cardiopulmonary disease. Electronically Signed   By: Lorriane Shire M.D.   On: 10/23/2018 07:44    Procedures Procedures (including critical care time)  Medications Ordered in ED Medications  ondansetron (ZOFRAN) injection 4 mg (has no administration in time range)  alum & mag hydroxide-simeth (MAALOX/MYLANTA) 200-200-20 MG/5ML suspension 30 mL (has no administration in time range)    And  lidocaine (XYLOCAINE) 2 % viscous mouth solution 15 mL (has no administration in time range)  sodium chloride 0.9 % bolus 1,000 mL (has no administration in time range)     Initial Impression / Assessment and Plan / ED Course  I have reviewed the triage vital signs and the nursing notes.  Pertinent labs & imaging results that were available during my care of the patient were reviewed by me and considered in my medical decision making (see chart for details).       2 hours of central chest pain.  Came on after vomiting which she has had for several days.  Chest pain is different than previous MI type pain.  EKG shows no acute ischemia.  Records reviewed.  Patient underwent colonoscopy and EGD on May 14. This showed hiatal hernia.  Colon was normal.  Diarrhea was thought to be due to IBS or microscopic colitis.  Gentle IVF and symptom control given. AAS negative. LFTs, lipase, troponin negative. GI cocktail.  Pain seems atypical for ACS and came on after vomiting. Not similar to previous MI pain. Equal upper extremity blood pressures and grip strengths. Low suspicion for PE, aortic dissection.  CT a/p ordered to further evaluate abdominal pain and diarrhea.  Serial troponin ordered for 9:45 am.  Dr. Rogene Houston to assume  care at shift change. Final Clinical Impressions(s) / ED Diagnoses   Final diagnoses:  None    ED Discharge Orders    None       Bartosz Luginbill, Annie Main, MD 10/23/18 [phone]

## 2018-10-23 NOTE — ED Triage Notes (Signed)
Pt C/O chest pain that started this morning around 0430. Pt also reports vomiting and diarrhea that started last night.

## 2018-10-23 NOTE — ED Provider Notes (Signed)
Patient's repeat troponin was normal.  Patient CT scan of the abdomen without any acute findings.  We will have patient follow back up with her GI doctor for the diarrhea also follow-up with cardiology.  And back with her primary care doctor.  We will give her a trial of dissolvable Zofran.  She has been using just the swallowed type of Zofran in the past.   Fredia Sorrow, MD 10/23/18 1041

## 2018-10-23 NOTE — Discharge Instructions (Signed)
Follow back up with your cardiologist.  Also follow back up with the gastroenterology.  Try the dissolvable Zofran prescription sent to the [facility] in [place].  Return for any new or worse symptoms.  Work note provided

## 2018-10-28 LAB — CULTURE, BLOOD (ROUTINE X 2)
Culture: NO GROWTH
Culture: NO GROWTH
Special Requests: ADEQUATE
Special Requests: ADEQUATE

## 2018-11-14 ENCOUNTER — Other Ambulatory Visit (HOSPITAL_COMMUNITY): Payer: Self-pay | Admitting: Family Medicine

## 2018-11-14 ENCOUNTER — Other Ambulatory Visit: Payer: Self-pay

## 2018-11-14 ENCOUNTER — Ambulatory Visit (INDEPENDENT_AMBULATORY_CARE_PROVIDER_SITE_OTHER): Payer: Managed Care, Other (non HMO) | Admitting: Student

## 2018-11-14 ENCOUNTER — Encounter: Payer: Self-pay | Admitting: Student

## 2018-11-14 VITALS — BP 128/72 | HR 94 | Temp 97.4°F | Ht 64.0 in | Wt 133.0 lb

## 2018-11-14 DIAGNOSIS — I2510 Atherosclerotic heart disease of native coronary artery without angina pectoris: Secondary | ICD-10-CM | POA: Diagnosis not present

## 2018-11-14 DIAGNOSIS — I7143 Infrarenal abdominal aortic aneurysm, without rupture: Secondary | ICD-10-CM

## 2018-11-14 DIAGNOSIS — I5042 Chronic combined systolic (congestive) and diastolic (congestive) heart failure: Secondary | ICD-10-CM | POA: Diagnosis not present

## 2018-11-14 DIAGNOSIS — E785 Hyperlipidemia, unspecified: Secondary | ICD-10-CM

## 2018-11-14 DIAGNOSIS — R197 Diarrhea, unspecified: Secondary | ICD-10-CM

## 2018-11-14 DIAGNOSIS — I10 Essential (primary) hypertension: Secondary | ICD-10-CM

## 2018-11-14 DIAGNOSIS — Z1231 Encounter for screening mammogram for malignant neoplasm of breast: Secondary | ICD-10-CM

## 2018-11-14 DIAGNOSIS — I714 Abdominal aortic aneurysm, without rupture: Secondary | ICD-10-CM

## 2018-11-14 NOTE — Progress Notes (Signed)
Cardiology Office Note    Date:  11/14/2018   ID:  Taleah, Bellantoni 13-Jan-1961, MRN [id number]  PCP:  [name], MD  Cardiologist: Kate Sable, MD    Chief Complaint  Patient presents with  . Follow-up    Recent Emergency Dept visit    History of Present Illness:    [name] is a 58 y.o. female with past medical history of CAD (s/p DES to LCx with staged DESx2 to RCA/PDA in 03/2016), chronic combined systolic and diastolic CHF (EF 54-56% by echo in 2018, at 40-45% in 2019), HTN, HLD, and moderate MR who presents to the office today for follow-up from a recent Emergency Dept visit.    She most recently had a telehealth visit with Dr. Bronson Ing in 08/2018 and had recently been evaluated in the ED for chest pain but denied any recurrent symptoms. Was having worsening episodes of diarrhea and had scheduled follow-up with GI. Was continued on her current regimen including ASA, Atorvastatin, Toprol-XL, Lasix, and Entresto. EGD and Colonoscopy in 09/2018 showed normal esophagus and a small hiatal hernia with no significant findings by colonoscopy. Diarrhea was thought to be secondary to IBS.   By review of notes, she lost insurance coverage in 10/2018 and could not afford Entresto, therefore this was stopped and transitioned to Losartan.   In the interim, she presented to [facility] ED on 10/23/2018 for evaluation of chest pain, vomiting, and diarrhea. Her episodes of chest discomfort occurred after the vomiting spells and was overall felt to be atypical for a cardiac etiology. BNP was mildly elevated at 119. Initial and delta troponin values were negative with EKG showing no acute ischemic changes. CT Abdomen showed no acute findings but was noted to have stable small liver lesions (likely benign hemangiomas), stable bilateral adrenal gland adenomas, coronary artery calcifications and a 2.5 cm infrarenal abdominal aortic aneurysm.  In talking with the patient today, she reports  doing well from a cardiac perspective. Denies any chest pain, dyspnea on exertion, orthopnea, PND, or edema. She does experience occasional palpitations but denies any persistent symptoms and reports these typically occur when under stress. She recently switched jobs and anticipates her new insurance will take effect in 12/2018.   Her biggest concern today is that she continues to have significant diarrhea, occurring 8-10 times per day. She has reduced her food consumption because of this to try to avoid episodes while at work. Can also occur with consuming a large amount of water or soda. She had a hamburger on July 4th and reports going to the restroom over 8 times within 2 hours. Reports associated periumbilical pain when this occurs. No melena or hematochezia. Still having some intermittent nausea and vomiting despite compliance with her medication regimen. She has reached out to GI but the first available appointment was in 12/2018.   Past Medical History:  Diagnosis Date  . Anxiety state 03/25/2016  . Basal cell carcinoma of forehead   . CHF (congestive heart failure) ([place])   . Coronary artery disease    a. 03/11/16 PCI with DES-->Prox/Mid Cx;  b. 03/14/16 PCI with DES x2-->RCA, EF 30-35%.  . Essential hypertension   . GERD (gastroesophageal reflux disease)   . HFrEF (heart failure with reduced ejection fraction) ([place])    a. 10/2016 Echo: EF 35-40%, Gr1 DD, mild focal basal septal hypertrophy, basal inflat, mid inflat, basal antlat AK. Mid infept/inf/antlat, apical lateral sev HK. Mod MR. mildly reduced RV fxn. Mild TR.  [name] Kitchen  History of pneumonia   . Hyperlipidemia   . Ischemic cardiomyopathy    a. 10/2016 Echo: EF 35-40%, Gr1 DD.  [name] Kitchen Mitral regurgitation   . NSTEMI (non-ST elevated myocardial infarction) ([place]) 03/10/2016  . Squamous cell cancer of skin of nose   . Thrombocytosis ([place]) 03/26/2016  . Tobacco abuse   . Trichimoniasis   . Wears dentures   . Wears glasses     Past Surgical  History:  Procedure Laterality Date  . APPENDECTOMY    . BIOPSY  09/20/2018   Procedure: BIOPSY;  Surgeon: Daneil Dolin, MD;  [facility];  Service: Endoscopy;;  colon  . CARDIAC CATHETERIZATION N/A 03/11/2016   Procedure: Left Heart Cath and Coronary Angiography;  Surgeon: Leonie Man, MD;  Location: [place] [facility];  Service: Cardiovascular;  Laterality: N/A;  . CARDIAC CATHETERIZATION N/A 03/11/2016   Procedure: Coronary Stent Intervention;  Surgeon: Leonie Man, MD;  Location: [place] [facility];  Service: Cardiovascular;  Laterality: N/A;  . CARDIAC CATHETERIZATION N/A 03/14/2016   Procedure: Coronary Stent Intervention;  Surgeon: Peter M Martinique, MD;  Location: [place] [facility];  Service: Cardiovascular;  Laterality: N/A;  . CHOLECYSTECTOMY OPEN  1984  . COLONOSCOPY WITH PROPOFOL N/A 09/20/2018   Procedure: COLONOSCOPY WITH PROPOFOL;  Surgeon: Daneil Dolin, MD;  [facility];  Service: Endoscopy;  Laterality: N/A;  10:30am  . CORONARY ANGIOPLASTY WITH STENT PLACEMENT  03/14/2016  . ESOPHAGOGASTRODUODENOSCOPY (EGD) WITH PROPOFOL N/A 09/20/2018   Procedure: ESOPHAGOGASTRODUODENOSCOPY (EGD) WITH PROPOFOL;  Surgeon: Daneil Dolin, MD;  [facility];  Service: Endoscopy;  Laterality: N/A;  . FINGER ARTHROPLASTY Left 05/14/2013   Procedure: LEFT THUMB CARPAL METACARPAL ARTHROPLASTY;  Surgeon: Tennis Must, MD;  Location: [place];  Service: Orthopedics;  Laterality: Left;  . IR ANGIO INTRA EXTRACRAN SEL COM CAROTID INNOMINATE BILAT MOD SED  01/05/2017  . IR ANGIO VERTEBRAL SEL VERTEBRAL BILAT MOD SED  01/05/2017  . IR RADIOLOGIST EVAL & MGMT  12/30/2016  . MALONEY DILATION N/A 09/20/2018   Procedure: Venia Minks DILATION;  Surgeon: Daneil Dolin, MD;  [facility];  Service: Endoscopy;  Laterality: N/A;  . TUBAL LIGATION  1987  . VAGINAL HYSTERECTOMY  2009    Current Medications: Outpatient Medications Prior to Visit   Medication Sig Dispense Refill  . acetaminophen (TYLENOL) 325 MG tablet Take 325 mg by mouth every 6 (six) hours as needed for mild pain or headache.     . ALPRAZolam (XANAX) 1 MG tablet Take 1 mg by mouth at bedtime.     [name] Kitchen aspirin EC 81 MG tablet Take 81 mg by mouth every evening.     [name] Kitchen atorvastatin (LIPITOR) 80 MG tablet TAKE 1 TABLET BY MOUTH ONCE DAILY AT  6  IN  THE  EVENING (Patient taking differently: Take 80 mg by mouth every evening. ) 90 tablet 3  . cholestyramine light (PREVALITE) 4 g packet Take 0.5 packets (2 g total) by mouth daily. 60 packet 1  . dicyclomine (BENTYL) 10 MG capsule Take 1 capsule (10 mg total) by mouth 4 (four) times daily -  before meals and at bedtime. 120 capsule 3  . ezetimibe (ZETIA) 10 MG tablet Take 1 tablet by mouth once daily 30 tablet 3  . furosemide (LASIX) 20 MG tablet Take 1 tablet (20 mg total) by mouth daily. 90 tablet 3  . gabapentin (NEURONTIN) 100 MG capsule Take 100 mg by  mouth 3 (three) times daily.    . isosorbide mononitrate (IMDUR) 30 MG 24 hr tablet Take 1 tablet by mouth once daily 90 tablet 3  . Loperamide HCl (IMODIUM PO) Take by mouth.    [name] Kitchen LORazepam (ATIVAN) 1 MG tablet Take 1 mg by mouth daily as needed for anxiety.   1  . losartan (COZAAR) 25 MG tablet Take 1 tablet (25 mg total) by mouth daily. 90 tablet 3  . metoprolol succinate (TOPROL XL) 25 MG 24 hr tablet Take 0.5 tablets (12.5 mg total) by mouth daily. (Patient taking differently: Take 12.5 mg by mouth daily. Hasn't taken in past week d/t low BP) 30 tablet 0  . Multiple Vitamins-Minerals (MULTIVITAMIN WITH MINERALS) tablet Take 1 tablet by mouth daily.    . nicotine (NICODERM CQ - DOSED IN MG/24 HOURS) 21 mg/24hr patch Place 21 mg onto the skin daily.    . nitroGLYCERIN (NITROSTAT) 0.4 MG SL tablet Place 1 tablet (0.4 mg total) under the tongue every 5 (five) minutes x 3 doses as needed for chest pain. 30 tablet 12  . ondansetron (ZOFRAN ODT) 4 MG disintegrating tablet Take 1  tablet (4 mg total) by mouth every 8 (eight) hours as needed. 10 tablet 1  . ondansetron (ZOFRAN) 4 MG tablet Take 1 tablet (4 mg total) by mouth every 8 (eight) hours as needed for nausea or vomiting. 30 tablet 1  . potassium chloride SA (KLOR-CON M20) 20 MEQ tablet Take 1 tablet (20 mEq total) by mouth daily. 90 tablet 3  . rOPINIRole (REQUIP) 0.5 MG tablet Take 0.5 mg by mouth at bedtime.   0   No facility-administered medications prior to visit.      Allergies:   Tape   Social History   Socioeconomic History  . Marital status: Married    Spouse name: Not on file  . Number of children: Not on file  . Years of education: Not on file  . Highest education level: Not on file  Occupational History  . Occupation: CNA  Social Needs  . Financial resource strain: Not on file  . Food insecurity    Worry: Not on file    Inability: Not on file  . Transportation needs    Medical: Not on file    Non-medical: Not on file  Tobacco Use  . Smoking status: Current Some Day Smoker    Packs/day: 0.10    Years: 15.00    Pack years: 1.50    Types: Cigarettes  . Smokeless tobacco: Never Used  . Tobacco comment: smokes a cigarette occasionally  Substance and Sexual Activity  . Alcohol use: Yes    Comment: occasionally  . Drug use: Not Currently    Types: Marijuana    Comment: former  . Sexual activity: Not Currently    Birth control/protection: Surgical    Comment: hyst  Lifestyle  . Physical activity    Days per week: Not on file    Minutes per session: Not on file  . Stress: Not on file  Relationships  . Social Herbalist on phone: Not on file    Gets together: Not on file    Attends religious service: Not on file    Active member of club or organization: Not on file    Attends meetings of clubs or organizations: Not on file    Relationship status: Not on file  Other Topics Concern  . Not on file  Social History Narrative  Lives with husband in [place] in a one story  home with a basement.  Has 4 children.  Works as a Quarry manager.  Education: CNA school.      Family History:  The patient's family history includes Breast cancer in her maternal grandmother; CAD in her father; Colon polyps (age of onset: 77) in her father; Diabetes in her father and mother; Heart failure in an other family member; Hypertension in her father and mother; Stroke in her mother.   Review of Systems:   Please see the history of present illness.     General:  No chills, fever, night sweats or weight changes.  Cardiovascular:  No chest pain, dyspnea on exertion, edema, orthopnea, palpitations, paroxysmal nocturnal dyspnea. Dermatological: No rash, lesions/masses Respiratory: No cough, dyspnea Urologic: No hematuria, dysuria Abdominal:   No bright red blood per rectum, melena, or hematemesis. Positive for nausea, vomiting, and diarrhea.  Neurologic:  No visual changes, wkns, changes in mental status. All other systems reviewed and are otherwise negative except as noted above.   Physical Exam:    VS:  BP 128/72 (BP Location: Left Arm)   Pulse 94   Temp (!) 97.4 F (36.3 C)   Ht 5\' 4"  (1.626 m)   Wt 133 lb (60.3 kg)   SpO2 98%   BMI 22.83 kg/m    General: Well developed, well nourished,female appearing in no acute distress. Head: Normocephalic, atraumatic, sclera non-icteric, no xanthomas, nares are without discharge.  Neck: No carotid bruits. JVD not elevated.  Lungs: Respirations regular and unlabored, without wheezes or rales.  Heart: Regular rate and rhythm. No S3 or S4.  No murmur, no rubs, or gallops appreciated. Abdomen: Soft, non-tender, non-distended with normoactive bowel sounds. No hepatomegaly. No rebound/guarding. No obvious abdominal masses. Msk:  Strength and tone appear normal for age. No joint deformities or effusions. Extremities: No clubbing or cyanosis. No lower extremity edema.  Distal pedal pulses are 2+ bilaterally. Neuro: Alert and oriented X 3. Moves all  extremities spontaneously. No focal deficits noted. Psych:  Responds to questions appropriately with a normal affect. Skin: No rashes or lesions noted  Wt Readings from Last 3 Encounters:  11/14/18 133 lb (60.3 kg)  09/20/18 135 lb (61.2 kg)  08/28/18 136 lb (61.7 kg)     Studies/Labs Reviewed:   EKG:  EKG is not ordered today.    Recent Labs: 10/23/2018: ALT 8; B Natriuretic Peptide 119.0; BUN 15; Creatinine, Ser 0.86; Hemoglobin 11.1; Platelets 395; Potassium 3.4; Sodium 140   Lipid Panel    Component Value Date/Time   CHOL 179 03/19/2017 0643   TRIG 245 (H) 03/19/2017 0643   HDL 49 03/19/2017 0643   CHOLHDL 3.7 03/19/2017 0643   VLDL 49 (H) 03/19/2017 0643   LDLCALC 81 03/19/2017 0643    Additional studies/ records that were reviewed today include:   Cardiac Catheterization: 03/2016  Prox-Mid Cx lesion, 100 %stenosed.  A STENT SYNERGY DES 3X12 drug eluting stent was successfully placed, and overlaps previously placed stent.  Post intervention, there is a 0% residual stenosis.  Dist RCA lesion, 80 %stenosed. - Would consider treatment in a staged fashion, could either be done during this hospitalization or as an outpatient.  The left ventricular ejection fraction is 45-50% by visual estimate.  There is severe (4+) mitral regurgitation.  LV end diastolic pressure is severely elevated.    Pathophysiologically, this is equivalent to a non-ST elevation MI, however with no EKG changes, is technically non-STEMI.  She will  be [facility] post procedure unit/stepdown. Was chest pain-free upon completion of the procedure.  Plan:  TR band removal per protocol  Continue Angiomax until current bag completed  Nitroglycerin infusion weaned from 40 g/min down to 20. Would continue to wean overnight.  1dose IV Lasix 40 mg for elevated LVEDP and MR  Order home dose metoprolol for this evening, and start ACE inhibitor tomorrow.  Dual antiplatelet therapy for  minimum of 3 months, could potentially consider stopping aspirin after 3 months  We'll need 2-D echocardiogram to clarify extent of MR  Based on the existing lesion, and MR, would not fast-track discharge  Coronary Stent Intervention: 03/2016  Mid Cx lesion, 0 %stenosed.  A drug eluting .  A STENT PROMUS PREM MR 2.25X16 drug eluting stent was successfully placed.  A STENT PROMUS PREM MR 2.25X8 drug-eluting stent was successfully placed.  Dist RCA lesion, 60 %stenosed.  Post intervention, there is a 0% residual stenosis.  Post Atrio lesion, 0 %stenosed.  Post intervention, there is a 0% residual stenosis.  A stent was successfully placed.   1. Successful Stenting of the distal RCA/PDA with DES x 2.  Plan: DAPT for one year. Risk factor modification.  NST: 03/2017  There was no ST segment deviation noted during stress.  Findings consistent with prior large lateral/inferolateral/inferior/inferoapical infarction with minimal peri-infarct ischemia. There is a small basal septal infarct  This is a high risk study. High risk based on low LVEF and large scar. There is minimal myocardium currently at jeopardy. Consider correlating LVEF with echo.  Nuclear stress EF: 30%.  Assessment:    1. Coronary artery disease involving native coronary artery of native heart without angina pectoris   2. Chronic combined systolic and diastolic CHF (congestive heart failure) ([place])   3. Essential hypertension   4. Hyperlipidemia LDL goal <70   5. Diarrhea, unspecified type   6. Aneurysm of infrarenal abdominal aorta ([facility])      Plan:   In order of problems listed above:  1. CAD - s/p DES to LCx with staged DESx2 to RCA/PDA in 03/2016 with NST in 03/2017 showing minimal peri-infarct ischemia.  - she denies any recent chest pain and her breathing has been at baseline. Recent EKG showed no acute ischemic changes and troponin values were negative.  - continue current medication regimen  with ASA, Atorvastatin, Imdur, and Toprol-XL.   2. Chronic Combined Systolic and Diastolic CHF - EF 33-29% by echo in 2018, at 40-45% by repeat imaging in 2019. She denies any recent orthopnea, PND, or edema.  - continue Toprol-XL and Losartan. Reports she felt better on Entresto so if covered by her new insurance, would anticipate switching back from Losartan to [place] at that time. She will call to make [place] aware of any changes. She remains on Lasix 20mg  daily and we reviewed holding this intermittently if her diarrhea does not improve as I am concerned about dehydration.   3. HTN  - BP well-controlled at 128/72 during today's visit.  - continue current medication regimen.   4. HLD - followed by PCP. She remains on Atorvastatin 80mg  daily and Zetia 10mg  daily.   5. Diarrhea - she reports having 8-10 episodes per day.  No melena or hematochezia. Still having some intermittent nausea and vomiting despite compliance with her medication regimen. Recent EGD showed no significant findings with colonoscopy suggestive of IBS. Will send a staff message to GI to see if any changes can be made to her regimen  as she does not have an office visit for another 6-8 weeks.    6. AAA - had a 2.5 cm infrarenal abdominal aortic aneurysm by recent CT. Reviewed with the patient today. Would anticipate repeat imaging in 2-3 years.   Medication Adjustments/Labs and Tests Ordered: Current medicines are reviewed at length with the patient today.  Concerns regarding medicines are outlined above.  Medication changes, Labs and Tests ordered today are listed in the Patient Instructions below. Patient Instructions  Medication Instructions: Your physician recommends that you continue on your current medications as directed. Please refer to the Current Medication list given to you today.   Labwork: None today  Procedures/Testing: None today  Follow-Up:  3-4 months with Dr.Koneswaran in the [place] office  Any  Additional Special Instructions Will Be Listed Below (If Applicable).  If you need a refill on your cardiac medications before your next appointment, please call your pharmacy.    Thank you for choosing [facility] !        Signed, Erma Heritage, PA-C  11/14/2018 8:12 PM    [facility] [address] [place], [place] [address] Phone: [phone] Fax: [phone]

## 2018-11-14 NOTE — Patient Instructions (Signed)
Medication Instructions: Your physician recommends that you continue on your current medications as directed. Please refer to the Current Medication list given to you today.   Labwork: None today  Procedures/Testing: None today  Follow-Up:  3-4 months with Dr.Koneswaran in the [place] office  Any Additional Special Instructions Will Be Listed Below (If Applicable).     If you need a refill on your cardiac medications before your next appointment, please call your pharmacy.      Thank you for choosing [facility] !

## 2018-11-20 ENCOUNTER — Telehealth: Payer: Self-pay | Admitting: Gastroenterology

## 2018-11-20 ENCOUNTER — Other Ambulatory Visit: Payer: Self-pay

## 2018-11-20 DIAGNOSIS — R197 Diarrhea, unspecified: Secondary | ICD-10-CM

## 2018-11-20 NOTE — Telephone Encounter (Signed)
Lmom, waiting on a return call.  

## 2018-11-20 NOTE — Telephone Encounter (Signed)
Elmo Putt: can we get an update on patient? I received word from cardiology that she was having numerous stools daily. Colonoscopy on file recently with negative biopsies.   We tried to obtain Cdiff prior to colonoscopy, but the lab did not process it.   We need to have her complete a Cdiff GDH and toxin ASAP. Need to have this on file and then can provide recommendations regarding medical management.

## 2018-11-20 NOTE — Telephone Encounter (Signed)
Pt returned call. Lab orders have been placed for [facility] per pts request. Pt will complete stool studies this week.

## 2018-12-09 ENCOUNTER — Other Ambulatory Visit: Payer: Self-pay | Admitting: Cardiovascular Disease

## 2018-12-22 ENCOUNTER — Emergency Department (HOSPITAL_COMMUNITY): Payer: Worker's Compensation

## 2018-12-22 ENCOUNTER — Encounter (HOSPITAL_COMMUNITY): Payer: Self-pay | Admitting: Emergency Medicine

## 2018-12-22 ENCOUNTER — Emergency Department (HOSPITAL_COMMUNITY)
Admission: EM | Admit: 2018-12-22 | Discharge: 2018-12-22 | Disposition: A | Discharge status: Home / Self Care | Payer: Worker's Compensation | Attending: Emergency Medicine | Admitting: Emergency Medicine

## 2018-12-22 ENCOUNTER — Other Ambulatory Visit: Payer: Self-pay

## 2018-12-22 DIAGNOSIS — S60011A Contusion of right thumb without damage to nail, initial encounter: Secondary | ICD-10-CM | POA: Insufficient documentation

## 2018-12-22 DIAGNOSIS — I5042 Chronic combined systolic (congestive) and diastolic (congestive) heart failure: Secondary | ICD-10-CM | POA: Diagnosis not present

## 2018-12-22 DIAGNOSIS — I259 Chronic ischemic heart disease, unspecified: Secondary | ICD-10-CM | POA: Diagnosis not present

## 2018-12-22 DIAGNOSIS — Z7982 Long term (current) use of aspirin: Secondary | ICD-10-CM | POA: Diagnosis not present

## 2018-12-22 DIAGNOSIS — Y93F2 Activity, caregiving, lifting: Secondary | ICD-10-CM | POA: Insufficient documentation

## 2018-12-22 DIAGNOSIS — F17210 Nicotine dependence, cigarettes, uncomplicated: Secondary | ICD-10-CM | POA: Diagnosis not present

## 2018-12-22 DIAGNOSIS — Z955 Presence of coronary angioplasty implant and graft: Secondary | ICD-10-CM | POA: Diagnosis not present

## 2018-12-22 DIAGNOSIS — Z85828 Personal history of other malignant neoplasm of skin: Secondary | ICD-10-CM | POA: Insufficient documentation

## 2018-12-22 DIAGNOSIS — W231XXA Caught, crushed, jammed, or pinched between stationary objects, initial encounter: Secondary | ICD-10-CM | POA: Insufficient documentation

## 2018-12-22 DIAGNOSIS — Z79899 Other long term (current) drug therapy: Secondary | ICD-10-CM | POA: Insufficient documentation

## 2018-12-22 DIAGNOSIS — Y999 Unspecified external cause status: Secondary | ICD-10-CM | POA: Diagnosis not present

## 2018-12-22 DIAGNOSIS — I110 Hypertensive heart disease with heart failure: Secondary | ICD-10-CM | POA: Diagnosis not present

## 2018-12-22 DIAGNOSIS — Y92122 Bedroom in nursing home as the place of occurrence of the external cause: Secondary | ICD-10-CM | POA: Insufficient documentation

## 2018-12-22 DIAGNOSIS — S6991XA Unspecified injury of right wrist, hand and finger(s), initial encounter: Secondary | ICD-10-CM | POA: Diagnosis present

## 2018-12-22 MED ORDER — TRAMADOL HCL 50 MG PO TABS: 50.0000 mg | ORAL_TABLET | Freq: Four times a day (QID) | ORAL | 0 refills | Status: DC | PRN

## 2018-12-22 NOTE — ED Provider Notes (Signed)
[facility] EMERGENCY DEPARTMENT Provider Note   CSN: [id number] Arrival date & time: 12/22/18  1029     History   Chief Complaint Chief Complaint  Patient presents with  . Finger Injury    HPI Erin [name] is a 58 y.o. female with a past medical history as outlined below, presenting with injury to her right thumb which occurred just prior to arrival.  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  She is right-handed.  She describes immediate and constant throbbing pain in the distal thumb and joint with mild edema appreciated.  She denies numbness distal to the injury site.  She has had no treatment for this injury prior to arrival.     The history is provided by the patient.    Past Medical History:  Diagnosis Date  . Anxiety state 03/25/2016  . Basal cell carcinoma of forehead   . CHF (congestive heart failure) ([place])   . Coronary artery disease    a. 03/11/16 PCI with DES-->Prox/Mid Cx;  b. 03/14/16 PCI with DES x2-->RCA, EF 30-35%.  . Essential hypertension   . GERD (gastroesophageal reflux disease)   . HFrEF (heart failure with reduced ejection fraction) ([place])    a. 10/2016 Echo: EF 35-40%, Gr1 DD, mild focal basal septal hypertrophy, basal inflat, mid inflat, basal antlat AK. Mid infept/inf/antlat, apical lateral sev HK. Mod MR. mildly reduced RV fxn. Mild TR.  [name] Kitchen History of pneumonia   . Hyperlipidemia   . Ischemic cardiomyopathy    a. 10/2016 Echo: EF 35-40%, Gr1 DD.  [name] Kitchen Mitral regurgitation   . NSTEMI (non-ST elevated myocardial infarction) ([place]) 03/10/2016  . Squamous cell cancer of skin of nose   . Thrombocytosis ([place]) 03/26/2016  . Tobacco abuse   . Trichimoniasis   . Wears dentures   . Wears glasses     Patient Active Problem List   Diagnosis Date Noted  . Dysphagia 02/27/2018  . Encounter for screening colonoscopy 02/27/2018  . History of Clostridium difficile infection  02/27/2018  . Frequent stools 12/20/2017  . Chronic combined systolic and diastolic CHF (congestive heart failure) ([employer]) 06/20/2016  . Hypokalemia   . Acute CHF (congestive heart failure) ([place]) 05/16/2016  . Acute on chronic systolic CHF (congestive heart failure) ([place]) 05/16/2016  . Acute respiratory failure with hypoxia ([place])   . Thrombocytosis ([place]) 03/26/2016  . Cardiomyopathy, ischemic 03/25/2016  . Chronic combined systolic and diastolic heart failure ([place]) 03/25/2016  . Anxiety state 03/25/2016  . Troponin level elevated 03/25/2016  . CAD (coronary artery disease), native coronary artery 03/25/2016  . Normocytic anemia 03/25/2016  . SOB (shortness of breath) 03/24/2016  . Lightheadedness 03/17/2016  . Hypotension 03/17/2016  . Tobacco abuse 03/12/2016  . NSTEMI (non-ST elevated myocardial infarction) ([place]) 03/11/2016  . Pain in the chest   . Essential hypertension 09/06/2015  . Mixed hyperlipidemia 09/06/2015  . GERD (gastroesophageal reflux disease) 09/06/2015  . Chest pain 09/06/2015    Past Surgical History:  Procedure Laterality Date  . APPENDECTOMY    . BIOPSY  09/20/2018   Procedure: BIOPSY;  Surgeon: Daneil Dolin, MD;  Location[facility];  Service: Endoscopy;;  colon  . CARDIAC CATHETERIZATION N/A 03/11/2016   Procedure: Left Heart Cath and Coronary Angiography;  Surgeon: Leonie Man, MD;  Location: [place] CV LAB;  Service: Cardiovascular;  Laterality: N/A;  . CARDIAC CATHETERIZATION N/A 03/11/2016  Procedure: Coronary Stent Intervention;  Surgeon: Leonie Man, MD;  Location: [place] [facility] LAB;  Service: Cardiovascular;  Laterality: N/A;  . CARDIAC CATHETERIZATION N/A 03/14/2016   Procedure: Coronary Stent Intervention;  Surgeon: Peter M Martinique, MD;  Location: [place] CV LAB;  Service: Cardiovascular;  Laterality: N/A;  . CHOLECYSTECTOMY OPEN  1984  . COLONOSCOPY WITH PROPOFOL N/A 09/20/2018   Procedure: COLONOSCOPY WITH PROPOFOL;  Surgeon:  Daneil Dolin, MD;  Location: [facility];  Service: Endoscopy;  Laterality: N/A;  10:30am  . CORONARY ANGIOPLASTY WITH STENT PLACEMENT  03/14/2016  . ESOPHAGOGASTRODUODENOSCOPY (EGD) WITH PROPOFOL N/A 09/20/2018   Procedure: ESOPHAGOGASTRODUODENOSCOPY (EGD) WITH PROPOFOL;  Surgeon: Daneil Dolin, MD;  [facility];  Service: Endoscopy;  Laterality: N/A;  . FINGER ARTHROPLASTY Left 05/14/2013   Procedure: LEFT THUMB CARPAL METACARPAL ARTHROPLASTY;  Surgeon: Tennis Must, MD;  Location: [place];  Service: Orthopedics;  Laterality: Left;  . IR ANGIO INTRA EXTRACRAN SEL COM CAROTID INNOMINATE BILAT MOD SED  01/05/2017  . IR ANGIO VERTEBRAL SEL VERTEBRAL BILAT MOD SED  01/05/2017  . IR RADIOLOGIST EVAL & MGMT  12/30/2016  . MALONEY DILATION N/A 09/20/2018   Procedure: Venia Minks DILATION;  Surgeon: Daneil Dolin, MD;  [facility];  Service: Endoscopy;  Laterality: N/A;  . TUBAL LIGATION  1987  . VAGINAL HYSTERECTOMY  2009     OB History    Gravida  4   Para  4   Term  4   Preterm      AB      Living  4     SAB      TAB      Ectopic      Multiple      Live Births               Home Medications    Prior to Admission medications   Medication Sig Start Date End Date Taking? Authorizing Provider  acetaminophen (TYLENOL) 325 MG tablet Take 325 mg by mouth every 6 (six) hours as needed for mild pain or headache.     [provider]  ALPRAZolam Duanne Moron) 1 MG tablet Take 1 mg by mouth at bedtime.     [provider]  aspirin EC 81 MG tablet Take 81 mg by mouth every evening.     [provider]  atorvastatin (LIPITOR) 80 MG tablet TAKE 1 TABLET BY MOUTH ONCE DAILY AT  6  IN  THE  EVENING Patient taking differently: Take 80 mg by mouth every evening.  04/03/18   Herminio Commons, MD  cholestyramine light (PREVALITE) 4 g packet Take 0.5 packets (2 g total) by mouth daily. 09/11/18   Annitta Needs, NP   dicyclomine (BENTYL) 10 MG capsule Take 1 capsule (10 mg total) by mouth 4 (four) times daily -  before meals and at bedtime. 09/05/18   Annitta Needs, NP  ezetimibe (ZETIA) 10 MG tablet Take 1 tablet by mouth once daily 12/10/18   Herminio Commons, MD  furosemide (LASIX) 20 MG tablet Take 1 tablet (20 mg total) by mouth daily. 05/16/18   Herminio Commons, MD  gabapentin (NEURONTIN) 100 MG capsule Take 100 mg by mouth 3 (three) times daily.    [provider]  isosorbide mononitrate (IMDUR) 30 MG 24 hr tablet Take 1 tablet by mouth once daily 08/09/18   Herminio Commons, MD  Loperamide HCl (IMODIUM PO) Take  by mouth.    [provider]  LORazepam (ATIVAN) 1 MG tablet Take 1 mg by mouth daily as needed for anxiety.  11/09/17   [provider]  losartan (COZAAR) 25 MG tablet Take 1 tablet (25 mg total) by mouth daily. 10/12/18 01/10/19  Herminio Commons, MD  metoprolol succinate (TOPROL XL) 25 MG 24 hr tablet Take 0.5 tablets (12.5 mg total) by mouth daily. Patient taking differently: Take 12.5 mg by mouth daily. Hasn't taken in past week d/t low BP 11/17/17   Sherwood Gambler, MD  Multiple Vitamins-Minerals (MULTIVITAMIN WITH MINERALS) tablet Take 1 tablet by mouth daily.    [provider]  nicotine (NICODERM CQ - DOSED IN MG/24 HOURS) 21 mg/24hr patch Place 21 mg onto the skin daily.    [provider]  nitroGLYCERIN (NITROSTAT) 0.4 MG SL tablet Place 1 tablet (0.4 mg total) under the tongue every 5 (five) minutes x 3 doses as needed for chest pain. 03/05/18   Manuella Ghazi, Pratik D, DO  ondansetron (ZOFRAN ODT) 4 MG disintegrating tablet Take 1 tablet (4 mg total) by mouth every 8 (eight) hours as needed. 10/23/18   Fredia Sorrow, MD  ondansetron (ZOFRAN) 4 MG tablet Take 1 tablet (4 mg total) by mouth every 8 (eight) hours as needed for nausea or vomiting. 08/22/18   Annitta Needs, NP  potassium chloride SA (KLOR-CON M20) 20 MEQ tablet Take 1 tablet (20 mEq  total) by mouth daily. 04/11/18   Herminio Commons, MD  rOPINIRole (REQUIP) 0.5 MG tablet Take 0.5 mg by mouth at bedtime.  11/13/17   [provider]  traMADol (ULTRAM) 50 MG tablet Take 1 tablet (50 mg total) by mouth every 6 (six) hours as needed. 12/22/18   Evalee Jefferson, PA-C    Family History Family History  Problem Relation Age of Onset  . Diabetes Father   . Hypertension Father   . CAD Father   . Colon polyps Father 60       pre-cancerous   . Stroke Mother   . Hypertension Mother   . Diabetes Mother   . Heart failure Other   . Breast cancer Maternal Grandmother   . Colon cancer Neg Hx     Social History Social History   Tobacco Use  . Smoking status: Current Some Day Smoker    Packs/day: 0.10    Years: 15.00    Pack years: 1.50    Types: Cigarettes  . Smokeless tobacco: Never Used  . Tobacco comment: smokes a cigarette occasionally  Substance Use Topics  . Alcohol use: Yes    Comment: occasionally  . Drug use: Not Currently    Types: Marijuana    Comment: former     Allergies   Tape   Review of Systems Review of Systems  Constitutional: Negative for fever.  Musculoskeletal: Positive for arthralgias and joint swelling. Negative for myalgias.  Skin: Negative for color change and wound.  Neurological: Negative for weakness and numbness.     Physical Exam Updated Vital Signs BP (!) 145/76   Pulse 85   Temp 98.3 F (36.8 C) (Oral)   Resp 18   Ht 5' (1.524 m)   Wt 57.6 kg   SpO2 100%   BMI 24.80 kg/m   Physical Exam Constitutional:      Appearance: She is well-developed.  HENT:     Head: Atraumatic.  Neck:     Musculoskeletal: Normal range of motion.  Cardiovascular:  Comments: Pulses equal bilaterally Musculoskeletal:        General: Swelling and tenderness present.       Hands:     Comments: Pain to palpation at the right distal phalanx including the DIP joint of her thumb.  Nail is intact with no subungual hematoma.   Distal sensation is intact.  No cutaneous injury present.  She does display a moderate flexion of the distal joint without discomfort, no crepitus appreciated.  Full flexion is limited, suspected from chronic arthritis which she does endorse.  Skin:    General: Skin is warm and dry.  Neurological:     Mental Status: She is alert.     Sensory: No sensory deficit.     Deep Tendon Reflexes: Reflexes normal.      ED Treatments / Results  Labs (all labs ordered are listed, but only abnormal results are displayed) Labs Reviewed - No data to display  EKG None  Radiology Dg Hand Complete Right  Result Date: 12/22/2018 CLINICAL DATA:  Right thumb injury with swelling and pain, initial encounter. EXAM: RIGHT HAND - COMPLETE 3+ VIEW COMPARISON:  None. FINDINGS: No acute osseous or joint abnormality. Degenerative changes are seen in the interphalangeal joint of the thumb. IMPRESSION: 1. No acute findings. 2. Degenerative changes in the interphalangeal joint of the thumb. Electronically Signed   By: Lorin Picket M.D.   On: 12/22/2018 11:47    Procedures Procedures (including critical care time)  Medications Ordered in ED Medications - No data to display   Initial Impression / Assessment and Plan / ED Course  I have reviewed the triage vital signs and the nursing notes.  Pertinent labs & imaging results that were available during my care of the patient were reviewed by me and considered in my medical decision making (see chart for details).        Imaging reviewed and discussed with patient.  Suspect contusion of distal right thumb, no bony injuries appreciated.  She was placed in an aluminum splint for comfort.  Discussed rest, ice, elevation.  Plan recheck by her PCP if symptoms are not improving over the next 7 to 10 days.  Final Clinical Impressions(s) / ED Diagnoses   Final diagnoses:  Contusion of right thumb without damage to nail, initial encounter    ED Discharge Orders          Ordered    traMADol (ULTRAM) 50 MG tablet  Every 6 hours PRN     12/22/18 1204           Evalee Jefferson, PA-C 12/22/18 1320    Fredia Sorrow, MD 12/23/18 1550

## 2018-12-22 NOTE — Discharge Instructions (Signed)
Your x-ray is negative for any acute bony injury, therefore I suspect you have a contusion of your thumb which should resolve with time.  I recommend ice therapy as much as possible for the next several days in addition to elevation to help with pain and swelling.  Continue taking Tylenol if needed for pain relief.  I have prescribed tramadol additionally, this medication can make you drowsy, do not drive within force of taking this medication.  Wear the finger splint for comfort.

## 2018-12-22 NOTE — ED Triage Notes (Signed)
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  Swelling and pain in her thumb

## 2018-12-28 ENCOUNTER — Ambulatory Visit: Payer: Managed Care, Other (non HMO) | Admitting: Gastroenterology

## 2018-12-28 ENCOUNTER — Ambulatory Visit: Payer: Managed Care, Other (non HMO) | Admitting: Cardiovascular Disease

## 2019-01-10 ENCOUNTER — Telehealth: Payer: Self-pay | Admitting: Cardiovascular Disease

## 2019-01-10 NOTE — Telephone Encounter (Signed)
Pt's BP has been running low for a few days, this morning it was 80/53 and that was the lowest it's been.   Please give pt a call

## 2019-01-10 NOTE — Telephone Encounter (Signed)
Returned call to pt. Pt not available. Told to call pt at [phone]. No answer. Unable to leave msg.

## 2019-01-11 NOTE — Telephone Encounter (Signed)
Called home number, told by female to call work number.Work number has full vm, unable to leave message. I called home number back and a woman answered and stated Ms.Freiberger was not available

## 2019-01-16 NOTE — Telephone Encounter (Signed)
Called pt. No answer, left message for pt to return call if she was still having issues with her blood pressure.

## 2019-01-30 ENCOUNTER — Telehealth (HOSPITAL_COMMUNITY): Payer: Self-pay

## 2019-01-30 NOTE — Telephone Encounter (Signed)
Called to schedule f/u us carotid, no answer, left vm. AW 

## 2019-01-31 ENCOUNTER — Other Ambulatory Visit (HOSPITAL_COMMUNITY): Payer: Self-pay | Admitting: Interventional Radiology

## 2019-01-31 DIAGNOSIS — I771 Stricture of artery: Secondary | ICD-10-CM

## 2019-02-17 ENCOUNTER — Emergency Department (HOSPITAL_COMMUNITY)
Admission: EM | Admit: 2019-02-17 | Discharge: 2019-02-17 | Disposition: A | Discharge status: Home / Self Care | Payer: PRIVATE HEALTH INSURANCE | Attending: Emergency Medicine | Admitting: Emergency Medicine

## 2019-02-17 ENCOUNTER — Emergency Department (HOSPITAL_COMMUNITY): Payer: PRIVATE HEALTH INSURANCE

## 2019-02-17 ENCOUNTER — Other Ambulatory Visit: Payer: Self-pay

## 2019-02-17 DIAGNOSIS — I2510 Atherosclerotic heart disease of native coronary artery without angina pectoris: Secondary | ICD-10-CM | POA: Insufficient documentation

## 2019-02-17 DIAGNOSIS — Z7982 Long term (current) use of aspirin: Secondary | ICD-10-CM | POA: Diagnosis not present

## 2019-02-17 DIAGNOSIS — G8929 Other chronic pain: Secondary | ICD-10-CM | POA: Diagnosis not present

## 2019-02-17 DIAGNOSIS — I5040 Unspecified combined systolic (congestive) and diastolic (congestive) heart failure: Secondary | ICD-10-CM | POA: Diagnosis not present

## 2019-02-17 DIAGNOSIS — R197 Diarrhea, unspecified: Secondary | ICD-10-CM | POA: Insufficient documentation

## 2019-02-17 DIAGNOSIS — F17210 Nicotine dependence, cigarettes, uncomplicated: Secondary | ICD-10-CM | POA: Insufficient documentation

## 2019-02-17 DIAGNOSIS — Z79899 Other long term (current) drug therapy: Secondary | ICD-10-CM | POA: Insufficient documentation

## 2019-02-17 DIAGNOSIS — K625 Hemorrhage of anus and rectum: Secondary | ICD-10-CM | POA: Diagnosis present

## 2019-02-17 DIAGNOSIS — R109 Unspecified abdominal pain: Secondary | ICD-10-CM | POA: Insufficient documentation

## 2019-02-17 DIAGNOSIS — I110 Hypertensive heart disease with heart failure: Secondary | ICD-10-CM | POA: Insufficient documentation

## 2019-02-17 LAB — CBC WITH DIFFERENTIAL/PLATELET
Abs Immature Granulocytes: 0.05 10*3/uL (ref 0.00–0.07)
Basophils Absolute: 0 10*3/uL (ref 0.0–0.1)
Basophils Relative: 0 %
Eosinophils Absolute: 0.1 10*3/uL (ref 0.0–0.5)
Eosinophils Relative: 1 %
HCT: 35.6 % — ABNORMAL LOW (ref 36.0–46.0)
Hemoglobin: 11.2 g/dL — ABNORMAL LOW (ref 12.0–15.0)
Immature Granulocytes: 1 %
Lymphocytes Relative: 31 %
Lymphs Abs: 2.5 10*3/uL (ref 0.7–4.0)
MCH: 27.9 pg (ref 26.0–34.0)
MCHC: 31.5 g/dL (ref 30.0–36.0)
MCV: 88.6 fL (ref 80.0–100.0)
Monocytes Absolute: 0.7 10*3/uL (ref 0.1–1.0)
Monocytes Relative: 8 %
Neutro Abs: 4.8 10*3/uL (ref 1.7–7.7)
Neutrophils Relative %: 59 %
Platelets: 403 10*3/uL — ABNORMAL HIGH (ref 150–400)
RBC: 4.02 MIL/uL (ref 3.87–5.11)
RDW: 14.9 % (ref 11.5–15.5)
WBC: 8 10*3/uL (ref 4.0–10.5)
nRBC: 0 % (ref 0.0–0.2)

## 2019-02-17 LAB — URINALYSIS, ROUTINE W REFLEX MICROSCOPIC
Bilirubin Urine: NEGATIVE
Glucose, UA: NEGATIVE mg/dL
Hgb urine dipstick: NEGATIVE
Ketones, ur: NEGATIVE mg/dL
Leukocytes,Ua: NEGATIVE
Nitrite: NEGATIVE
Protein, ur: NEGATIVE mg/dL
Specific Gravity, Urine: 1.012 (ref 1.005–1.030)
pH: 5 (ref 5.0–8.0)

## 2019-02-17 LAB — TROPONIN I (HIGH SENSITIVITY)
Troponin I (High Sensitivity): 6 ng/L (ref <18)
Troponin I (High Sensitivity): 7 ng/L (ref <18)

## 2019-02-17 LAB — COMPREHENSIVE METABOLIC PANEL
ALT: 18 U/L (ref 0–44)
AST: 21 U/L (ref 15–41)
Albumin: 3.8 g/dL (ref 3.5–5.0)
Alkaline Phosphatase: 75 U/L (ref 38–126)
Anion gap: 10 (ref 5–15)
BUN: 14 mg/dL (ref 6–20)
CO2: 22 mmol/L (ref 22–32)
Calcium: 9.1 mg/dL (ref 8.9–10.3)
Chloride: 106 mmol/L (ref 98–111)
Creatinine, Ser: 0.7 mg/dL (ref 0.44–1.00)
GFR calc Af Amer: >60 mL/min (ref >60)
GFR calc non Af Amer: >60 mL/min (ref >60)
Glucose, Bld: 100 mg/dL — ABNORMAL HIGH (ref 70–99)
Potassium: 4.4 mmol/L (ref 3.5–5.1)
Sodium: 138 mmol/L (ref 135–145)
Total Bilirubin: 0.5 mg/dL (ref 0.3–1.2)
Total Protein: 7.1 g/dL (ref 6.5–8.1)

## 2019-02-17 LAB — TYPE AND SCREEN
ABO/RH(D): O POS
Antibody Screen: NEGATIVE

## 2019-02-17 LAB — LIPASE, BLOOD: Lipase: 49 U/L (ref 11–51)

## 2019-02-17 MED ORDER — IOHEXOL 300 MG/ML  SOLN
100.0000 mL | Freq: Once | INTRAMUSCULAR | Status: AC | PRN
  Administered 2019-02-17: 100 mL via INTRAVENOUS

## 2019-02-17 MED ORDER — MORPHINE SULFATE (PF) 4 MG/ML IV SOLN
4.0000 mg | Freq: Once | INTRAVENOUS | Status: AC
  Administered 2019-02-17: 4 mg via INTRAVENOUS
  Filled 2019-02-17: qty 1

## 2019-02-17 MED ORDER — ONDANSETRON HCL 4 MG/2ML IJ SOLN
4.0000 mg | Freq: Once | INTRAMUSCULAR | Status: AC
  Administered 2019-02-17: 4 mg via INTRAVENOUS
  Filled 2019-02-17: qty 2

## 2019-02-17 NOTE — Discharge Instructions (Addendum)
Continue taking Imodium as directed.  Call Dr. Guerry Minors office tomorrow to arrange a follow-up appointment.

## 2019-02-17 NOTE — ED Triage Notes (Signed)
Pt reports has IBS and has had diarrhea for the past 3 months.  This morning pt c/o nausea and reports bright red blood in stools.  C/O generalized abd pain.

## 2019-02-18 ENCOUNTER — Ambulatory Visit (HOSPITAL_COMMUNITY)
Admission: RE | Admit: 2019-02-18 | Discharge: 2019-02-18 | Disposition: A | Discharge status: Home / Self Care | Payer: No Typology Code available for payment source | Source: Ambulatory Visit | Attending: Interventional Radiology | Admitting: Interventional Radiology

## 2019-02-18 DIAGNOSIS — I771 Stricture of artery: Secondary | ICD-10-CM

## 2019-02-18 NOTE — Progress Notes (Signed)
Carotid duplex has been completed.   Preliminary results in CV Proc.   Abram Sander 02/18/2019 1:09 PM

## 2019-02-19 ENCOUNTER — Telehealth (HOSPITAL_COMMUNITY): Payer: Self-pay

## 2019-02-19 NOTE — ED Provider Notes (Signed)
[facility] EMERGENCY DEPARTMENT Provider Note   CSN: [id number] Arrival date & time: 02/17/19  1034     History   Chief Complaint Chief Complaint  Patient presents with  . Rectal Bleeding    HPI Erin [name] is a 58 y.o. female.     HPI   Erin [name] is a 58 y.o. female with a PMH of CHF,HTN, IBS who presents to the Emergency Department complaining of chronic diarrhea for 3 months and noticing rectal bleeding on the morning of arrival.  She describes seeing bright red stool and blood on the tissue when wiping.  She had a normal colonoscopy in May 2020.  No hx of previous GI bleed.  Admits to taking ASA daily, but denies other NSAIDs.  She denies abdominal pain, bloating, fever, chills, generalized weakness, and dizziness.    Past Medical History:  Diagnosis Date  . Anxiety state 03/25/2016  . Basal cell carcinoma of forehead   . CHF (congestive heart failure) ([place])   . Coronary artery disease    a. 03/11/16 PCI with DES-->Prox/Mid Cx;  b. 03/14/16 PCI with DES x2-->RCA, EF 30-35%.  . Essential hypertension   . GERD (gastroesophageal reflux disease)   . HFrEF (heart failure with reduced ejection fraction) ([place])    a. 10/2016 Echo: EF 35-40%, Gr1 DD, mild focal basal septal hypertrophy, basal inflat, mid inflat, basal antlat AK. Mid infept/inf/antlat, apical lateral sev HK. Mod MR. mildly reduced RV fxn. Mild TR.  [name] Kitchen History of pneumonia   . Hyperlipidemia   . Ischemic cardiomyopathy    a. 10/2016 Echo: EF 35-40%, Gr1 DD.  [name] Kitchen Mitral regurgitation   . NSTEMI (non-ST elevated myocardial infarction) ([place]) 03/10/2016  . Squamous cell cancer of skin of nose   . Thrombocytosis ([place]) 03/26/2016  . Tobacco abuse   . Trichimoniasis   . Wears dentures   . Wears glasses     Patient Active Problem List   Diagnosis Date Noted  . Dysphagia 02/27/2018  . Encounter for screening colonoscopy 02/27/2018  . History of Clostridium difficile infection 02/27/2018  . Frequent  stools 12/20/2017  . Chronic combined systolic and diastolic CHF (congestive heart failure) ([place]) 06/20/2016  . Hypokalemia   . Acute CHF (congestive heart failure) ([place]) 05/16/2016  . Acute on chronic systolic CHF (congestive heart failure) ([place]) 05/16/2016  . Acute respiratory failure with hypoxia ([place])   . Thrombocytosis ([place]) 03/26/2016  . Cardiomyopathy, ischemic 03/25/2016  . Chronic combined systolic and diastolic heart failure ([place]) 03/25/2016  . Anxiety state 03/25/2016  . Troponin level elevated 03/25/2016  . CAD (coronary artery disease), native coronary artery 03/25/2016  . Normocytic anemia 03/25/2016  . SOB (shortness of breath) 03/24/2016  . Lightheadedness 03/17/2016  . Hypotension 03/17/2016  . Tobacco abuse 03/12/2016  . NSTEMI (non-ST elevated myocardial infarction) ([place]) 03/11/2016  . Pain in the chest   . Essential hypertension 09/06/2015  . Mixed hyperlipidemia 09/06/2015  . GERD (gastroesophageal reflux disease) 09/06/2015  . Chest pain 09/06/2015    Past Surgical History:  Procedure Laterality Date  . APPENDECTOMY    . BIOPSY  09/20/2018   Procedure: BIOPSY;  Surgeon: Daneil Dolin, MD;  Location[facility];  Service: Endoscopy;;  colon  . CARDIAC CATHETERIZATION N/A 03/11/2016   Procedure: Left Heart Cath and Coronary Angiography;  Surgeon: Leonie Man, MD;  Location: [place] [facility];  Service: Cardiovascular;  Laterality: N/A;  . CARDIAC CATHETERIZATION N/A 03/11/2016   Procedure: Coronary Stent Intervention;  Surgeon: Leonie Man, MD;  Location: [place] [facility];  Service: Cardiovascular;  Laterality: N/A;  . CARDIAC CATHETERIZATION N/A 03/14/2016   Procedure: Coronary Stent Intervention;  Surgeon: Peter M Martinique, MD;  Location: [place] [facility];  Service: Cardiovascular;  Laterality: N/A;  . CHOLECYSTECTOMY OPEN  1984  . COLONOSCOPY WITH PROPOFOL N/A 09/20/2018   Procedure: COLONOSCOPY WITH PROPOFOL;  Surgeon: Daneil Dolin, MD;   Location: [facility];  Service: Endoscopy;  Laterality: N/A;  10:30am  . CORONARY ANGIOPLASTY WITH STENT PLACEMENT  03/14/2016  . ESOPHAGOGASTRODUODENOSCOPY (EGD) WITH PROPOFOL N/A 09/20/2018   Procedure: ESOPHAGOGASTRODUODENOSCOPY (EGD) WITH PROPOFOL;  Surgeon: Daneil Dolin, MD;  [facility];  Service: Endoscopy;  Laterality: N/A;  . FINGER ARTHROPLASTY Left 05/14/2013   Procedure: LEFT THUMB CARPAL METACARPAL ARTHROPLASTY;  Surgeon: Tennis Must, MD;  Location: [place];  Service: Orthopedics;  Laterality: Left;  . IR ANGIO INTRA EXTRACRAN SEL COM CAROTID INNOMINATE BILAT MOD SED  01/05/2017  . IR ANGIO VERTEBRAL SEL VERTEBRAL BILAT MOD SED  01/05/2017  . IR RADIOLOGIST EVAL & MGMT  12/30/2016  . MALONEY DILATION N/A 09/20/2018   Procedure: Venia Minks DILATION;  Surgeon: Daneil Dolin, MD;  [facility];  Service: Endoscopy;  Laterality: N/A;  . TUBAL LIGATION  1987  . VAGINAL HYSTERECTOMY  2009     OB History    Gravida  4   Para  4   Term  4   Preterm      AB      Living  4     SAB      TAB      Ectopic      Multiple      Live Births               Home Medications    Prior to Admission medications   Medication Sig Start Date End Date Taking? Authorizing Provider  acetaminophen (TYLENOL) 325 MG tablet Take 325 mg by mouth every 6 (six) hours as needed for mild pain or headache.    Yes [provider]  ALPRAZolam Duanne Moron) 1 MG tablet Take 1 mg by mouth at bedtime.    Yes [provider]  aspirin EC 81 MG tablet Take 81 mg by mouth every evening.    Yes [provider]  atorvastatin (LIPITOR) 80 MG tablet TAKE 1 TABLET BY MOUTH ONCE DAILY AT  6  IN  THE  EVENING Patient taking differently: Take 80 mg by mouth every evening.  04/03/18  Yes Herminio Commons, MD  cholestyramine light (PREVALITE) 4 g packet Take 0.5 packets (2 g total) by mouth daily. 09/11/18  Yes Annitta Needs, NP  ezetimibe  (ZETIA) 10 MG tablet Take 1 tablet by mouth once daily 12/10/18  Yes Herminio Commons, MD  furosemide (LASIX) 20 MG tablet Take 1 tablet (20 mg total) by mouth daily. 05/16/18  Yes Herminio Commons, MD  gabapentin (NEURONTIN) 100 MG capsule Take 100 mg by mouth 3 (three) times daily.   Yes [provider]  Loperamide HCl (IMODIUM PO) Take 1-2 capsules by mouth every 6 (six) hours as needed.    Yes [provider]  LORazepam (ATIVAN) 1 MG tablet Take 1 mg by mouth daily as needed for anxiety.  11/09/17  Yes [provider]  losartan (COZAAR) 25 MG tablet Take 1 tablet (25 mg total) by mouth daily. 10/12/18 02/17/19 Yes Kate Sable  A, MD  metoprolol succinate (TOPROL XL) 25 MG 24 hr tablet Take 0.5 tablets (12.5 mg total) by mouth daily. Patient taking differently: Take 12.5 mg by mouth daily. Hasn't taken in past week d/t low BP 11/17/17  Yes Sherwood Gambler, MD  Multiple Vitamins-Minerals (MULTIVITAMIN WITH MINERALS) tablet Take 1 tablet by mouth daily.   Yes [provider]  nicotine (NICODERM CQ - DOSED IN MG/24 HOURS) 21 mg/24hr patch Place 21 mg onto the skin daily.   Yes [provider]  nitroGLYCERIN (NITROSTAT) 0.4 MG SL tablet Place 1 tablet (0.4 mg total) under the tongue every 5 (five) minutes x 3 doses as needed for chest pain. 03/05/18  Yes Shah, Pratik D, DO  potassium chloride SA (KLOR-CON M20) 20 MEQ tablet Take 1 tablet (20 mEq total) by mouth daily. 04/11/18  Yes Herminio Commons, MD  rOPINIRole (REQUIP) 0.5 MG tablet Take 0.5 mg by mouth at bedtime.  11/13/17  Yes [provider]  traMADol (ULTRAM) 50 MG tablet Take 1 tablet (50 mg total) by mouth every 6 (six) hours as needed. 12/22/18  Yes Idol, Almyra Free, PA-C  dicyclomine (BENTYL) 10 MG capsule Take 1 capsule (10 mg total) by mouth 4 (four) times daily -  before meals and at bedtime. Patient not taking: Reported on 02/17/2019 09/05/18   Annitta Needs, NP  isosorbide  mononitrate (IMDUR) 30 MG 24 hr tablet Take 1 tablet by mouth once daily Patient not taking: Reported on 02/17/2019 08/09/18   Herminio Commons, MD  ondansetron (ZOFRAN ODT) 4 MG disintegrating tablet Take 1 tablet (4 mg total) by mouth every 8 (eight) hours as needed. Patient not taking: Reported on 02/17/2019 10/23/18   Fredia Sorrow, MD  ondansetron (ZOFRAN) 4 MG tablet Take 1 tablet (4 mg total) by mouth every 8 (eight) hours as needed for nausea or vomiting. Patient not taking: Reported on 02/17/2019 08/22/18   Annitta Needs, NP    Family History Family History  Problem Relation Age of Onset  . Diabetes Father   . Hypertension Father   . CAD Father   . Colon polyps Father 60       pre-cancerous   . Stroke Mother   . Hypertension Mother   . Diabetes Mother   . Heart failure Other   . Breast cancer Maternal Grandmother   . Colon cancer Neg Hx     Social History Social History   Tobacco Use  . Smoking status: Current Some Day Smoker    Packs/day: 0.10    Years: 15.00    Pack years: 1.50    Types: Cigarettes  . Smokeless tobacco: Never Used  . Tobacco comment: smokes a cigarette occasionally  Substance Use Topics  . Alcohol use: Yes    Comment: occasionally  . Drug use: Not Currently    Types: Marijuana    Comment: former     Allergies   Tape   Review of Systems Review of Systems  Constitutional: Negative for appetite change, chills and fever.  Respiratory: Negative for shortness of breath.   Cardiovascular: Negative for chest pain and leg swelling.  Gastrointestinal: Positive for diarrhea. Negative for abdominal distention, abdominal pain, blood in stool, nausea and vomiting.  Genitourinary: Negative for decreased urine volume, difficulty urinating, dysuria and flank pain.  Musculoskeletal: Negative for back pain.  Skin: Negative for color change and rash.  Neurological: Negative for dizziness, weakness and numbness.  Hematological: Negative for  adenopathy.     Physical Exam  Updated Vital Signs BP 119/74   Pulse 91   Temp 98.2 F (36.8 C) (Oral)   Resp 20   Ht 5' (1.524 m)   Wt 59.9 kg   SpO2 100%   BMI 25.78 kg/m   Physical Exam Vitals signs and nursing note reviewed.  Constitutional:      Appearance: Normal appearance. She is not ill-appearing or toxic-appearing.  HENT:     Mouth/Throat:     Mouth: Mucous membranes are moist.     Pharynx: Oropharynx is clear.  Eyes:     Conjunctiva/sclera: Conjunctivae normal.     Pupils: Pupils are equal, round, and reactive to light.  Neck:     Musculoskeletal: Normal range of motion.  Cardiovascular:     Rate and Rhythm: Normal rate and regular rhythm.     Pulses: Normal pulses.  Pulmonary:     Effort: Pulmonary effort is normal.     Breath sounds: Normal breath sounds.  Abdominal:     General: There is no distension.     Palpations: Abdomen is soft. There is no mass.     Tenderness: There is no abdominal tenderness. There is no guarding.  Genitourinary:    Rectum: Guaiac result negative. No mass, anal fissure or external hemorrhoid.     Comments: Red colored stool that is guaiac negative.  No palable rectal masses. nml rectal tone. Musculoskeletal: Normal range of motion.     Right lower leg: No edema.     Left lower leg: No edema.  Skin:    General: Skin is warm.     Capillary Refill: Capillary refill takes less than 2 seconds.     Findings: No rash.  Neurological:     General: No focal deficit present.     Mental Status: She is alert.     Sensory: No sensory deficit.     Motor: No weakness.      ED Treatments / Results  Labs (all labs ordered are listed, but only abnormal results are displayed) Labs Reviewed  COMPREHENSIVE METABOLIC PANEL - Abnormal; Notable for the following components:      Result Value   Glucose, Bld 100 (*)    All other components within normal limits  CBC WITH DIFFERENTIAL/PLATELET - Abnormal; Notable for the following  components:   Hemoglobin 11.2 (*)    HCT 35.6 (*)    Platelets 403 (*)    All other components within normal limits  URINALYSIS, ROUTINE W REFLEX MICROSCOPIC - Abnormal; Notable for the following components:   Color, Urine STRAW (*)    All other components within normal limits  LIPASE, BLOOD  TYPE AND SCREEN  TROPONIN I (HIGH SENSITIVITY)  TROPONIN I (HIGH SENSITIVITY)    EKG EKG Interpretation  Date/Time:  Sunday February 17 2019 12:12:31 EDT Ventricular Rate:  83 PR Interval:    QRS Duration: 106 QT Interval:  383 QTC Calculation: 450 R Axis:   1 Text Interpretation:  Sinus rhythm Probable left atrial enlargement Consider anterior infarct Confirmed by Fredia Sorrow [phone]) on 02/18/2019 5:08:50 PM   Radiology Ct Abdomen Pelvis W Contrast  Result Date: 02/17/2019 CLINICAL DATA:  Chronic abdominal pain and diarrhea. Hematochezia this morning. EXAM: CT ABDOMEN AND PELVIS WITH CONTRAST TECHNIQUE: Multidetector CT imaging of the abdomen and pelvis was performed using the standard protocol following bolus administration of intravenous contrast. CONTRAST:  165mL OMNIPAQUE IOHEXOL 300 MG/ML  SOLN COMPARISON:  CT abdomen pelvis dated October 23, 2018. FINDINGS: Lower chest: No acute abnormality.  Hepatobiliary: Unchanged 9 mm hemangioma in the peripheral hepatic dome. Two additional subcentimeter low-density lesions in the liver remain too small to characterize but are unchanged. No new focal liver abnormality. Status post cholecystectomy. Stable mild common bile duct dilatation. Pancreas: Unremarkable. No pancreatic ductal dilatation or surrounding inflammatory changes. Spleen: Normal in size without focal abnormality. Adrenals/Urinary Tract: Unchanged bilateral adrenal gland adenomas, larger on the left. No renal lesions, calculi, or hydronephrosis. The bladder is unremarkable. Stomach/Bowel: Stomach is within normal limits. History of appendectomy. No evidence of bowel wall thickening,  distention, or inflammatory changes. Vascular/Lymphatic: Aortic atherosclerosis. Stable ectasia of the infrarenal abdominal aorta measuring up to 2.5 cm. No enlarged abdominal or pelvic lymph nodes. Reproductive: Status post hysterectomy. No adnexal masses. Other: Trace free fluid in the pelvis.  No pneumoperitoneum. Musculoskeletal: No acute or significant osseous findings. IMPRESSION: 1.  No acute intra-abdominal process. 2. Unchanged ectatic infrarenal abdominal aorta measuring up to 2.5 cm. Ectatic abdominal aorta at risk for aneurysm development. Recommend followup by ultrasound in 5 years. This recommendation follows ACR consensus guidelines: White Paper of the ACR Incidental Findings Committee II on Vascular Findings. J Am Coll Radiol 2013; 10:789-794. Aortic aneurysm NOS (ICD10-I71.9) 3.  Aortic atherosclerosis (ICD10-I70.0). Electronically Signed   By: Titus Dubin M.D.   On: 02/17/2019 15:20   Dg Chest Portable 1 View  Result Date: 02/17/2019 CLINICAL DATA:  Nausea and bright red blood in stool. Diarrhea for 3 months. EXAM: PORTABLE CHEST 1 VIEW COMPARISON:  Single-view of the chest 08/16/2018. FINDINGS: Lungs are clear. Heart size is normal. No pneumothorax or pleural fluid. No acute or focal bony abnormality. IMPRESSION: Negative chest. Electronically Signed   By: Inge Rise M.D.   On: 02/17/2019 12:45    Procedures Procedures (including critical care time)  Medications Ordered in ED Medications  morphine 4 MG/ML injection 4 mg (4 mg Intravenous Given 02/17/19 1252)  ondansetron (ZOFRAN) injection 4 mg (4 mg Intravenous Given 02/17/19 1252)  iohexol (OMNIPAQUE) 300 MG/ML solution 100 mL (100 mLs Intravenous Contrast Given 02/17/19 1438)     Initial Impression / Assessment and Plan / ED Course  I have reviewed the triage vital signs and the nursing notes.  Pertinent labs & imaging results that were available during my care of the patient were reviewed by me and considered in  my medical decision making (see chart for details).        Pt with complaint of bright red blood per rectum.  Heme negative stool on DRE.  Abdomen is soft and non-tender on exam.  Pt is non-toxic appearing and labs reassuring.  Hgb level today is stable compared to 3 months ago.  Pt denies known dietary changes.    Vitals reviewed.  No concerning sx's for sepsis or acute abdomen, no clinical finding to indicate acute rectal bleeding.    She agrees to close GI f/u for recheck.  Strict return precautions discussed.     Final Clinical Impressions(s) / ED Diagnoses   Final diagnoses:  Diarrhea, unspecified type    ED Discharge Orders    None       Kem Parkinson, Hershal Coria 02/19/19 2137    Fredia Sorrow, MD 02/21/19 [phone]

## 2019-02-19 NOTE — Telephone Encounter (Signed)
Called to schedule consult to go over US carotid results, no answer, left vm. AW

## 2019-02-20 ENCOUNTER — Other Ambulatory Visit (HOSPITAL_COMMUNITY): Payer: Self-pay | Admitting: Interventional Radiology

## 2019-02-20 DIAGNOSIS — I771 Stricture of artery: Secondary | ICD-10-CM

## 2019-02-22 ENCOUNTER — Encounter: Payer: Self-pay | Admitting: Cardiovascular Disease

## 2019-02-22 ENCOUNTER — Ambulatory Visit (INDEPENDENT_AMBULATORY_CARE_PROVIDER_SITE_OTHER): Payer: No Typology Code available for payment source | Admitting: Cardiovascular Disease

## 2019-02-22 ENCOUNTER — Other Ambulatory Visit: Payer: Self-pay

## 2019-02-22 VITALS — BP 114/81 | HR 98 | Temp 97.3°F | Ht 60.0 in | Wt 129.0 lb

## 2019-02-22 DIAGNOSIS — E785 Hyperlipidemia, unspecified: Secondary | ICD-10-CM

## 2019-02-22 DIAGNOSIS — I5042 Chronic combined systolic (congestive) and diastolic (congestive) heart failure: Secondary | ICD-10-CM

## 2019-02-22 DIAGNOSIS — I10 Essential (primary) hypertension: Secondary | ICD-10-CM

## 2019-02-22 DIAGNOSIS — R079 Chest pain, unspecified: Secondary | ICD-10-CM

## 2019-02-22 DIAGNOSIS — I209 Angina pectoris, unspecified: Secondary | ICD-10-CM

## 2019-02-22 DIAGNOSIS — I6522 Occlusion and stenosis of left carotid artery: Secondary | ICD-10-CM

## 2019-02-22 DIAGNOSIS — I25118 Atherosclerotic heart disease of native coronary artery with other forms of angina pectoris: Secondary | ICD-10-CM

## 2019-02-22 MED ORDER — ENTRESTO 24-26 MG PO TABS: 1.0000 | ORAL_TABLET | Freq: Two times a day (BID) | ORAL | 6 refills | Status: DC

## 2019-02-22 MED ORDER — METOPROLOL TARTRATE 25 MG PO TABS: 12.5000 mg | ORAL_TABLET | Freq: Two times a day (BID) | ORAL | 3 refills | Status: DC

## 2019-02-22 NOTE — Patient Instructions (Signed)
Medication Instructions:  Stop toprol xl  Stop losartan   Start ENTRESTO 24-26 MG TWO TIMES DAILY  START LOPRESSOR 12.5 MG- TWO TIMES DAILY   Labwork: NONE  Testing/Procedures: Your physician has requested that you have an echocardiogram. Echocardiography is a painless test that uses sound waves to create images of your heart. It provides your doctor with information about the size and shape of your heart and how well your heart's chambers and valves are working. This procedure takes approximately one hour. There are no restrictions for this procedure.  Your physician has requested that you have a lexiscan myoview. For further information please visit [email or web address]. Please follow instruction sheet, as given.    Follow-Up: Your physician recommends that you schedule a follow-up appointment in: 6 WEEKS    Any Other Special Instructions Will Be Listed Below (If Applicable).     If you need a refill on your cardiac medications before your next appointment, please call your pharmacy.

## 2019-02-22 NOTE — Progress Notes (Signed)
SUBJECTIVE: Erin [name] is a 58 y.o. female with past medical history of CAD (s/p DES to LCx with staged PCI/DESx2 to RCA/PDA in 03/2016, NST in 03/2017 showing large scar with minimal peri-infarct ischemia), chronic combined systolic and diastolic CHF,HTN,andHLD.  Echocardiogram 12/20/2017 showed a slight improvement in LVEF now up to 40 to 45% with wall motion abnormalities, grade 1 diastolic dysfunction, and mild mitral and tricuspid regurgitation. There is a small circumferential pericardial effusion.  She stopped taking Toprol-XL because blood pressures were in the 90/60 range.  She was switched to losartan from [facility] due to insurance reasons.  Carotid Dopplers on 02/18/2019 showed 60 to 79% left ICA stenosis.  She has been experiencing chest pain 3-4 times per week and has taken up to 2 nitroglycerin at a time.  She has not felt well in the past month.  She has noticed her heart rate has been more elevated as well.    Review of Systems: As per "subjective", otherwise negative.  Allergies  Allergen Reactions  . Tape Other (See Comments)    PEELS SKIN OFF  (PAPER TAPE IS FINE)    Current Outpatient Medications  Medication Sig Dispense Refill  . acetaminophen (TYLENOL) 325 MG tablet Take 325 mg by mouth every 6 (six) hours as needed for mild pain or headache.     . ALPRAZolam (XANAX) 1 MG tablet Take 1 mg by mouth at bedtime.     [name] Kitchen aspirin EC 81 MG tablet Take 81 mg by mouth every evening.     [name] Kitchen atorvastatin (LIPITOR) 80 MG tablet TAKE 1 TABLET BY MOUTH ONCE DAILY AT  6  IN  THE  EVENING (Patient taking differently: Take 80 mg by mouth every evening. ) 90 tablet 3  . ezetimibe (ZETIA) 10 MG tablet Take 1 tablet by mouth once daily 30 tablet 3  . furosemide (LASIX) 20 MG tablet Take 1 tablet (20 mg total) by mouth daily. 90 tablet 3  . gabapentin (NEURONTIN) 100 MG capsule Take 100 mg by mouth 3 (three) times daily.    . Loperamide HCl (IMODIUM PO) Take 1-2  capsules by mouth every 6 (six) hours as needed.     [name] Kitchen LORazepam (ATIVAN) 1 MG tablet Take 1 mg by mouth daily as needed for anxiety.   1  . losartan (COZAAR) 25 MG tablet Take 1 tablet (25 mg total) by mouth daily. 90 tablet 3  . metoprolol succinate (TOPROL XL) 25 MG 24 hr tablet Take 0.5 tablets (12.5 mg total) by mouth daily. (Patient taking differently: Take 12.5 mg by mouth daily. Hasn't taken in past week d/t low BP) 30 tablet 0  . Multiple Vitamins-Minerals (MULTIVITAMIN WITH MINERALS) tablet Take 1 tablet by mouth daily.    . nicotine (NICODERM CQ - DOSED IN MG/24 HOURS) 21 mg/24hr patch Place 21 mg onto the skin daily.    . nitroGLYCERIN (NITROSTAT) 0.4 MG SL tablet Place 1 tablet (0.4 mg total) under the tongue every 5 (five) minutes x 3 doses as needed for chest pain. 30 tablet 12  . ondansetron (ZOFRAN ODT) 4 MG disintegrating tablet Take 1 tablet (4 mg total) by mouth every 8 (eight) hours as needed. 10 tablet 1  . potassium chloride SA (KLOR-CON M20) 20 MEQ tablet Take 1 tablet (20 mEq total) by mouth daily. 90 tablet 3  . rOPINIRole (REQUIP) 0.5 MG tablet Take 0.5 mg by mouth at bedtime.   0  . traMADol (ULTRAM) 50 MG tablet  Take 1 tablet (50 mg total) by mouth every 6 (six) hours as needed. 15 tablet 0   No current facility-administered medications for this visit.     Past Medical History:  Diagnosis Date  . Anxiety state 03/25/2016  . Basal cell carcinoma of forehead   . CHF (congestive heart failure) ([place])   . Coronary artery disease    a. 03/11/16 PCI with DES-->Prox/Mid Cx;  b. 03/14/16 PCI with DES x2-->RCA, EF 30-35%.  . Essential hypertension   . GERD (gastroesophageal reflux disease)   . HFrEF (heart failure with reduced ejection fraction) ([place])    a. 10/2016 Echo: EF 35-40%, Gr1 DD, mild focal basal septal hypertrophy, basal inflat, mid inflat, basal antlat AK. Mid infept/inf/antlat, apical lateral sev HK. Mod MR. mildly reduced RV fxn. Mild TR.  [name] Kitchen History of  pneumonia   . Hyperlipidemia   . Ischemic cardiomyopathy    a. 10/2016 Echo: EF 35-40%, Gr1 DD.  [name] Kitchen Mitral regurgitation   . NSTEMI (non-ST elevated myocardial infarction) ([place]) 03/10/2016  . Squamous cell cancer of skin of nose   . Thrombocytosis ([place]) 03/26/2016  . Tobacco abuse   . Trichimoniasis   . Wears dentures   . Wears glasses     Past Surgical History:  Procedure Laterality Date  . APPENDECTOMY    . BIOPSY  09/20/2018   Procedure: BIOPSY;  Surgeon: Daneil Dolin, MD;  [facility];  Service: Endoscopy;;  colon  . CARDIAC CATHETERIZATION N/A 03/11/2016   Procedure: Left Heart Cath and Coronary Angiography;  Surgeon: Leonie Man, MD;  Location: [facility];  Service: Cardiovascular;  Laterality: N/A;  . CARDIAC CATHETERIZATION N/A 03/11/2016   Procedure: Coronary Stent Intervention;  Surgeon: Leonie Man, MD;  Location: [facility];  Service: Cardiovascular;  Laterality: N/A;  . CARDIAC CATHETERIZATION N/A 03/14/2016   Procedure: Coronary Stent Intervention;  Surgeon: Peter M Martinique, MD;  Location: [facility];  Service: Cardiovascular;  Laterality: N/A;  . CHOLECYSTECTOMY OPEN  1984  . COLONOSCOPY WITH PROPOFOL N/A 09/20/2018   Procedure: COLONOSCOPY WITH PROPOFOL;  Surgeon: Daneil Dolin, MD;  [facility]: [facility];  Service: Endoscopy;  Laterality: N/A;  10:30am  . CORONARY ANGIOPLASTY WITH STENT PLACEMENT  03/14/2016  . ESOPHAGOGASTRODUODENOSCOPY (EGD) WITH PROPOFOL N/A 09/20/2018   Procedure: ESOPHAGOGASTRODUODENOSCOPY (EGD) WITH PROPOFOL;  Surgeon: Daneil Dolin, MD;  [facility];  Service: Endoscopy;  Laterality: N/A;  . FINGER ARTHROPLASTY Left 05/14/2013   Procedure: LEFT THUMB CARPAL METACARPAL ARTHROPLASTY;  Surgeon: Tennis Must, MD;  Location: [facility];  Service: Orthopedics;  Laterality: Left;  . IR ANGIO INTRA EXTRACRAN SEL COM CAROTID INNOMINATE BILAT MOD SED  01/05/2017  . IR ANGIO VERTEBRAL  SEL VERTEBRAL BILAT MOD SED  01/05/2017  . IR RADIOLOGIST EVAL & MGMT  12/30/2016  . MALONEY DILATION N/A 09/20/2018   Procedure: Venia Minks DILATION;  Surgeon: Daneil Dolin, MD;  [facility];  Service: Endoscopy;  Laterality: N/A;  . TUBAL LIGATION  1987  . VAGINAL HYSTERECTOMY  2009    Social History   Socioeconomic History  . Marital status: Married    Spouse name: Not on file  . Number of children: Not on file  . Years of education: Not on file  . Highest education level: Not on file  Occupational History  . Occupation: CNA  Social Needs  . Financial resource strain: Not on file  . Food insecurity  Worry: Not on file    Inability: Not on file  . Transportation needs    Medical: Not on file    Non-medical: Not on file  Tobacco Use  . Smoking status: Current Some Day Smoker    Packs/day: 0.10    Years: 15.00    Pack years: 1.50    Types: Cigarettes  . Smokeless tobacco: Never Used  . Tobacco comment: smokes a cigarette occasionally  Substance and Sexual Activity  . Alcohol use: Yes    Comment: occasionally  . Drug use: Not Currently    Types: Marijuana    Comment: former  . Sexual activity: Not Currently    Birth control/protection: Surgical    Comment: hyst  Lifestyle  . Physical activity    Days per week: Not on file    Minutes per session: Not on file  . Stress: Not on file  Relationships  . Social Herbalist on phone: Not on file    Gets together: Not on file    Attends religious service: Not on file    Active member of club or organization: Not on file    Attends meetings of clubs or organizations: Not on file    Relationship status: Not on file  . Intimate partner violence    Fear of current or ex partner: Not on file    Emotionally abused: Not on file    Physically abused: Not on file    Forced sexual activity: Not on file  Other Topics Concern  . Not on file  Social History Narrative   Lives with husband in [place] in a one  story home with a basement.  Has 4 children.  Works as a Quarry manager.  Education: CNA school.      Vitals:   02/22/19 1509  BP: 114/81  Pulse: 98  Temp: (!) 97.3 F (36.3 C)  TempSrc: Temporal  SpO2: 99%  Weight: 129 lb (58.5 kg)  Height: 5' (1.524 m)    Wt Readings from Last 3 Encounters:  02/22/19 129 lb (58.5 kg)  02/17/19 132 lb (59.9 kg)  12/22/18 127 lb (57.6 kg)     PHYSICAL EXAM General: NAD HEENT: Normal. Neck: No JVD, no thyromegaly. Lungs: Clear to auscultation bilaterally with normal respiratory effort. CV: Regular rate and rhythm, normal S1/S2, no S3/S4, no murmur. No pretibial or periankle edema.  No carotid bruit.   Abdomen: Soft, nontender, no distention.  Neurologic: Alert and oriented.  Psych: Normal affect. Skin: Normal. Musculoskeletal: No gross deformities.      Labs: Lab Results  Component Value Date/Time   K 4.4 02/17/2019 11:54 AM   BUN 14 02/17/2019 11:54 AM   BUN 12 07/20/2016 01:27 PM   CREATININE 0.70 02/17/2019 11:54 AM   CREATININE 0.73 05/13/2016 10:20 AM   ALT 18 02/17/2019 11:54 AM   TSH 0.60 06/26/2017 11:28 AM   HGB 11.2 (L) 02/17/2019 11:54 AM     Lipids: Lab Results  Component Value Date/Time   LDLCALC 81 03/19/2017 06:43 AM   CHOL 179 03/19/2017 06:43 AM   TRIG 245 (H) 03/19/2017 06:43 AM   HDL 49 03/19/2017 06:43 AM       ASSESSMENT AND PLAN: 1. Coronary artery disease: She is having more frequent anginal pains. Continue aspirin,atorvastatin,and Zetia.  No longer on Toprol-XL or Imdur.  I will start Lopressor 12.5 mg twice daily.  I will obtain a Lexiscan Myoview to evaluate for significant ischemic territories.  I will obtain an  echocardiogram to evaluate cardiac structure and function.  2. Hypertension: Blood pressure islow normal.   She has not been taking Toprol-XL.  This will need further monitoring as I am stopping losartan and starting Entresto 24-26 mg twice daily.  3. Hyperlipidemia: Followed by PCP.  Continue Lipitor 80 mg and Zetia.   4. Cardiomyopathy/chronic combined systolic and diastolic heart failure:She is no longer taking Toprol-XL and Entresto and his currently taking losartan.  She takes Lasix 20 mg daily.  LVEF 40 to 45%. She has been instructed to take an extra 20 mg of Lasix for weight increase of 3 pounds in 24 hours. I will stop losartan and start Entresto 24-26 mg twice daily.  I will start Lopressor 12.5 mg twice daily.  I will obtain an echocardiogram to evaluate cardiac structure and function.  5.  Left internal carotid artery stenosis: 60 to 79% stenosis by Dopplers on 02/18/2019.  This is being followed by Dr. Estanislado Pandy.    Disposition: Follow up 6 weeks  A high level of decision making was required for increased medical complexities.   Kate Sable, M.D., F.A.C.C.

## 2019-02-26 ENCOUNTER — Encounter (HOSPITAL_COMMUNITY): Payer: Self-pay | Admitting: *Deleted

## 2019-02-26 ENCOUNTER — Emergency Department (HOSPITAL_COMMUNITY)
Admission: EM | Admit: 2019-02-26 | Discharge: 2019-02-26 | Disposition: A | Discharge status: Home / Self Care | Payer: PRIVATE HEALTH INSURANCE | Attending: Emergency Medicine | Admitting: Emergency Medicine

## 2019-02-26 ENCOUNTER — Other Ambulatory Visit: Payer: Self-pay

## 2019-02-26 ENCOUNTER — Emergency Department (HOSPITAL_COMMUNITY): Payer: PRIVATE HEALTH INSURANCE

## 2019-02-26 DIAGNOSIS — Z79899 Other long term (current) drug therapy: Secondary | ICD-10-CM | POA: Insufficient documentation

## 2019-02-26 DIAGNOSIS — Y9389 Activity, other specified: Secondary | ICD-10-CM | POA: Insufficient documentation

## 2019-02-26 DIAGNOSIS — I5042 Chronic combined systolic (congestive) and diastolic (congestive) heart failure: Secondary | ICD-10-CM | POA: Insufficient documentation

## 2019-02-26 DIAGNOSIS — Y999 Unspecified external cause status: Secondary | ICD-10-CM | POA: Diagnosis not present

## 2019-02-26 DIAGNOSIS — I110 Hypertensive heart disease with heart failure: Secondary | ICD-10-CM | POA: Diagnosis not present

## 2019-02-26 DIAGNOSIS — T148XXA Other injury of unspecified body region, initial encounter: Secondary | ICD-10-CM

## 2019-02-26 DIAGNOSIS — Y92009 Unspecified place in unspecified non-institutional (private) residence as the place of occurrence of the external cause: Secondary | ICD-10-CM | POA: Insufficient documentation

## 2019-02-26 DIAGNOSIS — I2510 Atherosclerotic heart disease of native coronary artery without angina pectoris: Secondary | ICD-10-CM | POA: Insufficient documentation

## 2019-02-26 DIAGNOSIS — R519 Headache, unspecified: Secondary | ICD-10-CM | POA: Insufficient documentation

## 2019-02-26 DIAGNOSIS — S61411A Laceration without foreign body of right hand, initial encounter: Secondary | ICD-10-CM | POA: Diagnosis not present

## 2019-02-26 DIAGNOSIS — F17210 Nicotine dependence, cigarettes, uncomplicated: Secondary | ICD-10-CM | POA: Diagnosis not present

## 2019-02-26 DIAGNOSIS — Z7982 Long term (current) use of aspirin: Secondary | ICD-10-CM | POA: Insufficient documentation

## 2019-02-26 DIAGNOSIS — M79641 Pain in right hand: Secondary | ICD-10-CM

## 2019-02-26 NOTE — Discharge Instructions (Signed)
Keep wound clean and dry.  Steri-Strips will fall off within about 5 days. You can take 1 to 2 tablets of Tylenol (350mg -1000mg  depending on the dose) every 6 hours as needed for pain.  Do not exceed 4000 mg of Tylenol daily.  If your pain persists you can take a doses of ibuprofen in between doses of Tylenol.  I usually recommend 400 to 600 mg of ibuprofen every 6 hours.  Take this with food to avoid upset stomach issues.  Can apply ice and elevate the hand to help with swelling.  Follow-up with primary care orthopedist if hand pain persists.  Please return to the emergency department if any concerning signs or symptoms develop such as severe headaches, vision changes, persistent vomiting, weakness to 1 side of the body, chest pain, shortness of breath, abdominal pain, redness or abnormal drainage to the ground.
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
## 2019-03-04 ENCOUNTER — Other Ambulatory Visit: Payer: Self-pay

## 2019-03-04 ENCOUNTER — Ambulatory Visit (HOSPITAL_COMMUNITY)
Admission: RE | Admit: 2019-03-04 | Discharge: 2019-03-04 | Disposition: A | Discharge status: Home / Self Care | Payer: No Typology Code available for payment source | Source: Ambulatory Visit | Attending: Interventional Radiology | Admitting: Interventional Radiology

## 2019-03-04 DIAGNOSIS — I771 Stricture of artery: Secondary | ICD-10-CM

## 2019-03-04 NOTE — Progress Notes (Signed)
Referring Physician(s): Dr Court Joy  Supervising Physician: Luanne Bras  Patient Status:  [facility] OP  Chief Complaint:  L ICA stenosis  Subjective:  Known L ICA stenosis since 2018 Has been asymptomatic  Arteriogram 01/05/17: IMPRESSION: 3 mm outpouching in the right posterior communicating artery region most likely angiographically representing an infundibulum. Approximately 40% stenosis of the left internal carotid artery at the bulb associated with mild caliber irregularity.  Approximately 65% stenosis of the left vertebral artery at C1-C2.  Approximately 60% stenoses of the inferior division of the right middle cerebral artery at its origin.  Approximately 65% stenosis of the proximal right subclavian artery. Mild intracranial arteriosclerosis involving the pericallosal Arteries.  Pt is Followed by Dr Estanislado Pandy  [place] 05/28/18: Summary: Right Carotid: Velocities in the right ICA are consistent with a 1-39% stenosis. Left Carotid: Velocities in the left ICA are consistent with a 40-59% stenosis.  [place] 02/18/19: Summary: Right Carotid: Velocities in the right ICA are consistent with a 1-39% stenosis. Left Carotid: Velocities in the left ICA are consistent with a 60-79% stenosis. Vertebrals: Bilateral vertebral arteries demonstrate antegrade flow  Generalized weakness Denies one side weak or numbness/tingling  Pt states she does have daily word finding issues. Has times when she feels like her vision is tunnel like Balance seems off Has had 2 falls in last 2 months Episodes last few minutes-- goes away on own Denies pain Denies N/V  Does have headaches sometimes Has headaches --- Hx migraines-- has not had one in quite a while No prodrome    Allergies: Tape  Medications: Prior to Admission medications   Medication Sig Start Date End Date Taking? Authorizing Provider  acetaminophen (TYLENOL) 325 MG tablet Take 325 mg by mouth every 6 (six) hours  as needed for mild pain or headache.     [provider]  ALPRAZolam Duanne Moron) 1 MG tablet Take 1 mg by mouth at bedtime.     [provider]  aspirin EC 81 MG tablet Take 81 mg by mouth every evening.     [provider]  atorvastatin (LIPITOR) 80 MG tablet TAKE 1 TABLET BY MOUTH ONCE DAILY AT  6  IN  THE  EVENING Patient taking differently: Take 80 mg by mouth every evening.  04/03/18   Herminio Commons, MD  ezetimibe (ZETIA) 10 MG tablet Take 1 tablet by mouth once daily 12/10/18   Herminio Commons, MD  furosemide (LASIX) 20 MG tablet Take 1 tablet (20 mg total) by mouth daily. 05/16/18   Herminio Commons, MD  gabapentin (NEURONTIN) 100 MG capsule Take 100 mg by mouth 3 (three) times daily.    [provider]  Loperamide HCl (IMODIUM PO) Take 1-2 capsules by mouth every 6 (six) hours as needed.     [provider]  LORazepam (ATIVAN) 1 MG tablet Take 1 mg by mouth daily as needed for anxiety.  11/09/17   [provider]  losartan (COZAAR) 25 MG tablet Take 1 tablet (25 mg total) by mouth daily. 10/12/18 02/22/19  Herminio Commons, MD  metoprolol tartrate (LOPRESSOR) 25 MG tablet Take 0.5 tablets (12.5 mg total) by mouth 2 (two) times daily. 02/22/19 05/23/19  Herminio Commons, MD  Multiple Vitamins-Minerals (MULTIVITAMIN WITH MINERALS) tablet Take 1 tablet by mouth daily.    [provider]  nicotine (NICODERM CQ - DOSED IN MG/24 HOURS) 21 mg/24hr patch Place 21 mg onto the skin daily.    [provider]  nitroGLYCERIN (NITROSTAT) 0.4 MG SL tablet Place 1 tablet (0.4 mg total) under the tongue every 5 (five) minutes x 3 doses as needed for chest pain. 03/05/18   Manuella Ghazi, Pratik D, DO  ondansetron (ZOFRAN ODT) 4 MG disintegrating tablet Take 1 tablet (4 mg total) by mouth every 8 (eight) hours as needed. 10/23/18   Fredia Sorrow, MD  potassium chloride SA (KLOR-CON M20) 20 MEQ tablet Take 1 tablet (20 mEq total) by  mouth daily. 04/11/18   Herminio Commons, MD  rOPINIRole (REQUIP) 0.5 MG tablet Take 0.5 mg by mouth at bedtime.  11/13/17   [provider]  sacubitril-valsartan (ENTRESTO) 24-26 MG Take 1 tablet by mouth 2 (two) times daily. 02/22/19   Herminio Commons, MD  traMADol (ULTRAM) 50 MG tablet Take 1 tablet (50 mg total) by mouth every 6 (six) hours as needed. 12/22/18   Evalee Jefferson, PA-C     Vital Signs: There were no vitals taken for this visit.  Physical Exam Constitutional:      Appearance: Normal appearance.  Musculoskeletal: Normal range of motion.  Neurological:     Mental Status: She is alert and oriented to person, place, and time.  Psychiatric:        Mood and Affect: Mood normal.        Behavior: Behavior normal.        Thought Content: Thought content normal.        Judgment: Judgment normal.     Imaging: No results found.  Labs:  CBC: Recent Labs    04/13/18 1500 08/16/18 0240 10/23/18 0645 02/17/19 1154  WBC 8.0 8.8 8.1 8.0  HGB 11.0* 12.3 11.1* 11.2*  HCT 35.3* 37.0 33.7* 35.6*  PLT 367 420* 395 403*    COAGS: No results for input(s): INR, APTT in the last 8760 hours.  BMP: Recent Labs    04/13/18 1500 08/16/18 0240 10/23/18 0645 02/17/19 1154  NA 138 134* 140 138  K 4.0 3.3* 3.4* 4.4  CL 105 98 104 106  CO2 26 25 22 22   GLUCOSE 88 120* 117* 100*  BUN 15 17 15 14   CALCIUM 8.9 9.5 9.5 9.1  CREATININE 0.83 1.11* 0.86 0.70  GFRNONAA >60 55* >60 >60  GFRAA >60 >60 >60 >60    LIVER FUNCTION TESTS: Recent Labs    04/13/18 1500 10/23/18 0645 02/17/19 1154  BILITOT 0.5 0.8 0.5  AST 29 23 21   ALT 22 8 18   ALKPHOS 63 70 75  PROT 6.9 6.9 7.1  ALBUMIN 3.8 3.8 3.8    Assessment and Plan:  Worsening of stenosis L ICA Symptoms now likely related to same Dr Estanislado Pandy discussed with pt and husband need for angiogram to evaluate stenosis accurately. They agree they want to move ahead with scheduling procedure Scheduler will  contact pt with time and date of arteriogram Will plan for intervention on separate occasion if needed   Electronically Signed: Lavonia Drafts, PA-C 03/04/2019, 1:20 PM   I spent a total of 15 Minutes at the the patient's bedside AND on the patient's hospital floor or unit, greater than 50% of which was counseling/coordinating care for L ICA stenosis

## 2019-03-07 ENCOUNTER — Other Ambulatory Visit: Payer: Self-pay

## 2019-03-07 ENCOUNTER — Ambulatory Visit (HOSPITAL_COMMUNITY)
Admission: RE | Admit: 2019-03-07 | Discharge: 2019-03-07 | Disposition: A | Discharge status: Home / Self Care | Payer: PRIVATE HEALTH INSURANCE | Source: Ambulatory Visit | Attending: Cardiovascular Disease | Admitting: Cardiovascular Disease

## 2019-03-07 ENCOUNTER — Ambulatory Visit (HOSPITAL_BASED_OUTPATIENT_CLINIC_OR_DEPARTMENT_OTHER)
Admission: RE | Admit: 2019-03-07 | Discharge: 2019-03-07 | Disposition: A | Discharge status: Home / Self Care | Payer: PRIVATE HEALTH INSURANCE | Source: Ambulatory Visit | Attending: Cardiovascular Disease | Admitting: Cardiovascular Disease

## 2019-03-07 ENCOUNTER — Encounter (HOSPITAL_COMMUNITY): Payer: Self-pay

## 2019-03-07 ENCOUNTER — Encounter (HOSPITAL_COMMUNITY)
Admission: RE | Admit: 2019-03-07 | Discharge: 2019-03-07 | Disposition: A | Discharge status: Home / Self Care | Payer: PRIVATE HEALTH INSURANCE | Source: Ambulatory Visit | Attending: Cardiovascular Disease | Admitting: Cardiovascular Disease

## 2019-03-07 DIAGNOSIS — I209 Angina pectoris, unspecified: Secondary | ICD-10-CM | POA: Insufficient documentation

## 2019-03-07 LAB — NM MYOCAR MULTI W/SPECT W/WALL MOTION / EF
LV dias vol: 94 mL (ref 46–106)
LV sys vol: 66 mL
Peak HR: 116 {beats}/min
RATE: 0.35
Rest HR: 72 {beats}/min
SDS: 3
SRS: 17
SSS: 20
TID: 1.09

## 2019-03-07 MED ORDER — REGADENOSON 0.4 MG/5ML IV SOLN
INTRAVENOUS | Status: AC
  Administered 2019-03-07: 11:00:00 5 mL via INTRAVENOUS
  Filled 2019-03-07: qty 5

## 2019-03-07 MED ORDER — TECHNETIUM TC 99M TETROFOSMIN IV KIT
10.0000 | PACK | Freq: Once | INTRAVENOUS | Status: AC | PRN
  Administered 2019-03-07: 10.1 via INTRAVENOUS

## 2019-03-07 MED ORDER — SODIUM CHLORIDE FLUSH 0.9 % IV SOLN
INTRAVENOUS | Status: AC
  Administered 2019-03-07: 10 mL via INTRAVENOUS
  Filled 2019-03-07: qty 10

## 2019-03-07 MED ORDER — TECHNETIUM TC 99M TETROFOSMIN IV KIT
30.0000 | PACK | Freq: Once | INTRAVENOUS | Status: AC | PRN
  Administered 2019-03-07: 32 via INTRAVENOUS

## 2019-03-07 NOTE — Progress Notes (Signed)
*  PRELIMINARY RESULTS* Echocardiogram 2D Echocardiogram has been performed.  Erin [name] 03/07/2019, 9:14 AM

## 2019-03-12 ENCOUNTER — Other Ambulatory Visit (HOSPITAL_COMMUNITY): Payer: Self-pay | Admitting: Interventional Radiology

## 2019-03-12 DIAGNOSIS — I771 Stricture of artery: Secondary | ICD-10-CM

## 2019-03-18 ENCOUNTER — Other Ambulatory Visit: Payer: Self-pay | Admitting: Student

## 2019-03-18 ENCOUNTER — Other Ambulatory Visit: Payer: Self-pay | Admitting: Radiology

## 2019-03-19 ENCOUNTER — Other Ambulatory Visit (HOSPITAL_COMMUNITY): Payer: Self-pay | Admitting: Interventional Radiology

## 2019-03-19 ENCOUNTER — Other Ambulatory Visit: Payer: Self-pay

## 2019-03-19 ENCOUNTER — Ambulatory Visit (HOSPITAL_COMMUNITY)
Admission: RE | Admit: 2019-03-19 | Discharge: 2019-03-19 | Disposition: A | Discharge status: Home / Self Care | Payer: PRIVATE HEALTH INSURANCE | Source: Ambulatory Visit | Attending: Interventional Radiology | Admitting: Interventional Radiology

## 2019-03-19 DIAGNOSIS — I110 Hypertensive heart disease with heart failure: Secondary | ICD-10-CM | POA: Insufficient documentation

## 2019-03-19 DIAGNOSIS — F17210 Nicotine dependence, cigarettes, uncomplicated: Secondary | ICD-10-CM | POA: Diagnosis not present

## 2019-03-19 DIAGNOSIS — I2510 Atherosclerotic heart disease of native coronary artery without angina pectoris: Secondary | ICD-10-CM | POA: Insufficient documentation

## 2019-03-19 DIAGNOSIS — I771 Stricture of artery: Secondary | ICD-10-CM

## 2019-03-19 DIAGNOSIS — Z79899 Other long term (current) drug therapy: Secondary | ICD-10-CM | POA: Insufficient documentation

## 2019-03-19 DIAGNOSIS — I255 Ischemic cardiomyopathy: Secondary | ICD-10-CM | POA: Insufficient documentation

## 2019-03-19 DIAGNOSIS — I671 Cerebral aneurysm, nonruptured: Secondary | ICD-10-CM | POA: Insufficient documentation

## 2019-03-19 DIAGNOSIS — I252 Old myocardial infarction: Secondary | ICD-10-CM | POA: Insufficient documentation

## 2019-03-19 DIAGNOSIS — K219 Gastro-esophageal reflux disease without esophagitis: Secondary | ICD-10-CM | POA: Insufficient documentation

## 2019-03-19 DIAGNOSIS — I509 Heart failure, unspecified: Secondary | ICD-10-CM | POA: Insufficient documentation

## 2019-03-19 DIAGNOSIS — E785 Hyperlipidemia, unspecified: Secondary | ICD-10-CM | POA: Diagnosis not present

## 2019-03-19 DIAGNOSIS — Z7982 Long term (current) use of aspirin: Secondary | ICD-10-CM | POA: Insufficient documentation

## 2019-03-19 HISTORY — PX: IR ANGIO INTRA EXTRACRAN SEL COM CAROTID INNOMINATE BILAT MOD SED: IMG5360

## 2019-03-19 HISTORY — PX: IR ANGIO VERTEBRAL SEL VERTEBRAL BILAT MOD SED: IMG5369

## 2019-03-19 HISTORY — PX: IR US GUIDE VASC ACCESS RIGHT: IMG2390

## 2019-03-19 LAB — CBC
HCT: 38.6 % (ref 36.0–46.0)
Hemoglobin: 12.3 g/dL (ref 12.0–15.0)
MCH: 28 pg (ref 26.0–34.0)
MCHC: 31.9 g/dL (ref 30.0–36.0)
MCV: 87.9 fL (ref 80.0–100.0)
Platelets: 423 10*3/uL — ABNORMAL HIGH (ref 150–400)
RBC: 4.39 MIL/uL (ref 3.87–5.11)
RDW: 15.4 % (ref 11.5–15.5)
WBC: 7.2 10*3/uL (ref 4.0–10.5)
nRBC: 0 % (ref 0.0–0.2)

## 2019-03-19 LAB — BASIC METABOLIC PANEL
Anion gap: 9 (ref 5–15)
BUN: 12 mg/dL (ref 6–20)
CO2: 23 mmol/L (ref 22–32)
Calcium: 9.4 mg/dL (ref 8.9–10.3)
Chloride: 108 mmol/L (ref 98–111)
Creatinine, Ser: 0.8 mg/dL (ref 0.44–1.00)
GFR calc Af Amer: >60 mL/min (ref >60)
GFR calc non Af Amer: >60 mL/min (ref >60)
Glucose, Bld: 104 mg/dL — ABNORMAL HIGH (ref 70–99)
Potassium: 4.2 mmol/L (ref 3.5–5.1)
Sodium: 140 mmol/L (ref 135–145)

## 2019-03-19 LAB — PROTIME-INR
INR: 0.9 (ref 0.8–1.2)
Prothrombin Time: 11.7 seconds (ref 11.4–15.2)

## 2019-03-19 MED ORDER — SODIUM CHLORIDE 0.9 % IV SOLN
INTRAVENOUS | Status: AC | PRN
  Administered 2019-03-19: 10 mL/h via INTRAVENOUS

## 2019-03-19 MED ORDER — SODIUM CHLORIDE 0.9 % IV SOLN: INTRAVENOUS | Status: AC

## 2019-03-19 MED ORDER — FENTANYL CITRATE (PF) 100 MCG/2ML IJ SOLN
INTRAMUSCULAR | Status: AC | PRN
  Administered 2019-03-19: 25 ug via INTRAVENOUS
  Administered 2019-03-19: 12.5 ug via INTRAVENOUS

## 2019-03-19 MED ORDER — VERAPAMIL HCL 2.5 MG/ML IV SOLN
INTRAVENOUS | Status: AC
  Filled 2019-03-19: qty 2

## 2019-03-19 MED ORDER — IOHEXOL 300 MG/ML  SOLN
150.0000 mL | Freq: Once | INTRAMUSCULAR | Status: AC | PRN
  Administered 2019-03-19: 70 mL via INTRA_ARTERIAL

## 2019-03-19 MED ORDER — NITROGLYCERIN 1 MG/10 ML FOR IR/CATH LAB
INTRA_ARTERIAL | Status: AC
  Filled 2019-03-19: qty 10

## 2019-03-19 MED ORDER — HEPARIN SODIUM (PORCINE) 1000 UNIT/ML IJ SOLN
INTRAMUSCULAR | Status: AC
  Filled 2019-03-19: qty 1

## 2019-03-19 MED ORDER — FENTANYL CITRATE (PF) 100 MCG/2ML IJ SOLN
INTRAMUSCULAR | Status: AC
  Filled 2019-03-19: qty 2

## 2019-03-19 MED ORDER — IOHEXOL 300 MG/ML  SOLN
50.0000 mL | Freq: Once | INTRAMUSCULAR | Status: AC | PRN
  Administered 2019-03-19: 20 mL via INTRA_ARTERIAL

## 2019-03-19 MED ORDER — MIDAZOLAM HCL 2 MG/2ML IJ SOLN
INTRAMUSCULAR | Status: AC | PRN
  Administered 2019-03-19: 1 mg via INTRAVENOUS
  Administered 2019-03-19: 0.5 mg via INTRAVENOUS

## 2019-03-19 MED ORDER — SODIUM CHLORIDE 0.9 % IV SOLN: Freq: Once | INTRAVENOUS | Status: DC

## 2019-03-19 MED ORDER — LIDOCAINE HCL 1 % IJ SOLN
INTRAMUSCULAR | Status: AC
  Filled 2019-03-19: qty 20

## 2019-03-19 MED ORDER — HEPARIN SODIUM (PORCINE) 1000 UNIT/ML IJ SOLN
INTRAMUSCULAR | Status: AC | PRN
  Administered 2019-03-19: 1000 [IU] via INTRAVENOUS

## 2019-03-19 MED ORDER — MIDAZOLAM HCL 2 MG/2ML IJ SOLN
INTRAMUSCULAR | Status: AC
  Filled 2019-03-19: qty 2

## 2019-03-19 NOTE — Sedation Documentation (Signed)
Right groin sheath removed. Quick clot applied 

## 2019-03-19 NOTE — Sedation Documentation (Signed)
ETC02 remover per Dr. Estanislado Pandy

## 2019-03-19 NOTE — Discharge Instructions (Addendum)
Radial Site Care ° °This sheet gives you information about how to care for yourself after your procedure. Your health care provider may also give you more specific instructions. If you have problems or questions, contact your health care provider. °What can I expect after the procedure? °After the procedure, it is common to have: °· Bruising and tenderness at the catheter insertion area. °Follow these instructions at home: °Medicines °· Take over-the-counter and prescription medicines only as told by your health care provider. °Insertion site care °· Follow instructions from your health care provider about how to take care of your insertion site. Make sure you: °? Wash your hands with soap and water before you change your bandage (dressing). If soap and water are not available, use hand sanitizer. °? Change your dressing as told by your health care provider. °? Leave stitches (sutures), skin glue, or adhesive strips in place. These skin closures may need to stay in place for 2 weeks or longer. If adhesive strip edges start to loosen and curl up, you may trim the loose edges. Do not remove adhesive strips completely unless your health care provider tells you to do that. °· Check your insertion site every day for signs of infection. Check for: °? Redness, swelling, or pain. °? Fluid or blood. °? Pus or a bad smell. °? Warmth. °· Do not take baths, swim, or use a hot tub until your health care provider approves. °· You may shower 24-48 hours after the procedure, or as directed by your health care provider. °? Remove the dressing and gently wash the site with plain soap and water. °? Pat the area dry with a clean towel. °? Do not rub the site. That could cause bleeding. °· Do not apply powder or lotion to the site. °Activity ° °· For 24 hours after the procedure, or as directed by your health care provider: °? Do not flex or bend the affected arm. °? Do not push or pull heavy objects with the affected arm. °? Do not  drive yourself home from the hospital or clinic. You may drive 24 hours after the procedure unless your health care provider tells you not to. °? Do not operate machinery or power tools. °· Do not lift anything that is heavier than 10 lb (4.5 kg), or the limit that you are told, until your health care provider says that it is safe. °· Ask your health care provider when it is okay to: °? Return to work or school. °? Resume usual physical activities or sports. °? Resume sexual activity. °General instructions °· If the catheter site starts to bleed, raise your arm and put firm pressure on the site. If the bleeding does not stop, get help right away. This is a medical emergency. °· If you went home on the same day as your procedure, a responsible adult should be with you for the first 24 hours after you arrive home. °· Keep all follow-up visits as told by your health care provider. This is important. °Contact a health care provider if: °· You have a fever. °· You have redness, swelling, or yellow drainage around your insertion site. °Get help right away if: °· You have unusual pain at the radial site. °· The catheter insertion area swells very fast. °· The insertion area is bleeding, and the bleeding does not stop when you hold steady pressure on the area. °· Your arm or hand becomes pale, cool, tingly, or numb. °These symptoms may represent a serious problem   that is an emergency. Do not wait to see if the symptoms will go away. Get medical help right away. Call your local emergency services (911 in the [place] Do not drive yourself to the hospital. °Summary °· After the procedure, it is common to have bruising and tenderness at the site. °· Follow instructions from your health care provider about how to take care of your radial site wound. Check the wound every day for signs of infection. °· Do not lift anything that is heavier than 10 lb (4.5 kg), or the limit that you are told, until your health care provider says  that it is safe. °This information is not intended to replace advice given to you by your health care provider. Make sure you discuss any questions you have with your health care provider. °Document Released: 05/28/2010 Document Revised: 05/31/2017 Document Reviewed: 05/31/2017 °[employer] Patient Education © 2020 [employer] ° ° °Angiogram, Care After °This sheet gives you information about how to care for yourself after your procedure. Your doctor may also give you more specific instructions. If you have problems or questions, contact your doctor. °Follow these instructions at home: °Insertion site care °· Follow instructions from your doctor about how to take care of your long, thin tube (catheter) insertion area. Make sure you: °? Wash your hands with soap and water before you change your bandage (dressing). If you cannot use soap and water, use hand sanitizer. °? Change your bandage as told by your doctor. °? Leave stitches (sutures), skin glue, or skin tape (adhesive) strips in place. They may need to stay in place for 2 weeks or longer. If tape strips get loose and curl up, you may trim the loose edges. Do not remove tape strips completely unless your doctor says it is okay. °· Do not take baths, swim, or use a hot tub until your doctor says it is okay. °· You may shower 24-48 hours after the procedure or as told by your doctor. °? Gently wash the area with plain soap and water. °? Pat the area dry with a clean towel. °? Do not rub the area. This may cause bleeding. °· Do not apply powder or lotion to the area. Keep the area clean and dry. °· Check your insertion area every day for signs of infection. Check for: °? More redness, swelling, or pain. °? Fluid or blood. °? Warmth. °? Pus or a bad smell. °Activity °· Rest as told by your doctor, usually for 1-2 days. °· Do not lift anything that is heavier than 10 lbs. (4.5 kg) or as told by your doctor. °· Do not drive for 24 hours if you were given a medicine to  help you relax (sedative). °· Do not drive or use heavy machinery while taking prescription pain medicine. °General instructions ° °· Go back to your normal activities as told by your doctor, usually in about a week. Ask your doctor what activities are safe for you. °· If the insertion area starts to bleed, lie flat and put pressure on the area. If the bleeding does not stop, get help right away. This is an emergency. °· Drink enough fluid to keep your pee (urine) clear or pale yellow. °· Take over-the-counter and prescription medicines only as told by your doctor. °· Keep all follow-up visits as told by your doctor. This is important. °Contact a doctor if: °· You have a fever. °· You have chills. °· You have more redness, swelling, or pain around your insertion   area. °· You have fluid or blood coming from your insertion area. °· The insertion area feels warm to the touch. °· You have pus or a bad smell coming from your insertion area. °· You have more bruising around the insertion area. °· Blood collects in the tissue around the insertion area (hematoma) that may be painful to the touch. °Get help right away if: °· You have a lot of pain in the insertion area. °· The insertion area swells very fast. °· The insertion area is bleeding, and the bleeding does not stop after holding steady pressure on the area. °· The area near or just beyond the insertion area becomes pale, cool, tingly, or numb. °These symptoms may be an emergency. Do not wait to see if the symptoms will go away. Get medical help right away. Call your local emergency services (911 in the [place] Do not drive yourself to the hospital. °Summary °· After the procedure, it is common to have bruising and tenderness at the long, thin tube insertion area. °· After the procedure, it is important to rest and drink plenty of fluids. °· Do not take baths, swim, or use a hot tub until your doctor says it is okay to do so. You may shower 24-48 hours after the  procedure or as told by your doctor. °· If the insertion area starts to bleed, lie flat and put pressure on the area. If the bleeding does not stop, get help right away. This is an emergency. °This information is not intended to replace advice given to you by your health care provider. Make sure you discuss any questions you have with your health care provider. °Document Released: 07/22/2008 Document Revised: 04/07/2017 Document Reviewed: 04/19/2016 °[employer] Patient Education © 2020 [employer] ° ° ° °Moderate Conscious Sedation, Adult, Care After °These instructions provide you with information about caring for yourself after your procedure. Your health care provider may also give you more specific instructions. Your treatment has been planned according to current medical practices, but problems sometimes occur. Call your health care provider if you have any problems or questions after your procedure. °What can I expect after the procedure? °After your procedure, it is common: °· To feel sleepy for several hours. °· To feel clumsy and have poor balance for several hours. °· To have poor judgment for several hours. °· To vomit if you eat too soon. °Follow these instructions at home: °For at least 24 hours after the procedure: ° °· Do not: °? Participate in activities where you could fall or become injured. °? Drive. °? Use heavy machinery. °? Drink alcohol. °? Take sleeping pills or medicines that cause drowsiness. °? Make important decisions or sign legal documents. °? Take care of children on your own. °· Rest. °Eating and drinking °· Follow the diet recommended by your health care provider. °· If you vomit: °? Drink water, juice, or soup when you can drink without vomiting. °? Make sure you have little or no nausea before eating solid foods. °General instructions °· Have a responsible adult stay with you until you are awake and alert. °· Take over-the-counter and prescription medicines only as told by your  health care provider. °· If you smoke, do not smoke without supervision. °· Keep all follow-up visits as told by your health care provider. This is important. °Contact a health care provider if: °· You keep feeling nauseous or you keep vomiting. °· You feel light-headed. °· You develop a rash. °· You have a   fever. °Get help right away if: °· You have trouble breathing. °This information is not intended to replace advice given to you by your health care provider. Make sure you discuss any questions you have with your health care provider. °Document Released: 02/13/2013 Document Revised: 04/07/2017 Document Reviewed: 08/15/2015 °[employer] Patient Education © 2020 [employer] ° °

## 2019-03-19 NOTE — Procedures (Signed)
S/P 4 vessel cerebral arterriogram RT CFA approach. Findings. 1.Approx 60 to 65 % stenosis of LT ICA proc 2.LTPCOM  ?infundubulum  3.41mmx 2.5 mm  3.RT PCOM ?infundibululum55mm x 2.4 mm S.Lelend Heinecke MD

## 2019-03-19 NOTE — H&P (Signed)
Chief Complaint: Patient was seen in consultation today for diagnostic cerebral angiogram.  Referring Physician(s): Dr. Bronson Ing  Supervising Physician: Luanne Bras  Patient Status: [facility] - Out-pt  History of Present Illness: Erin [name] is a 58 y.o. female with a past medical history significant for anxiety, CHF, CAD, HTN, HLD, ischemic cardiomyopathy, NSTEMI and known left ICA stenosis followed by NIR since 2018 who presents today for a diagnostic cerebral angiogram. Erin [name] previously underwent a diagnostic cerebral angiogram on 01/05/17 which showed a 3 mm outpouching in the right PCA region, approximately 40% stenosis of the left ICA at the bulb associated with mild caliber irregularity, approximately 65% stenosis of the left vertebral artery at C1-2, approximately 60% stenosis of the inferior division of the right MCA at its origin and approximately 65% stenosis of the proximal right subclavian artery. She was asymptomatic at that time and opted for conservative management at that time.  She was seen by Dr. Estanislado Pandy on 03/04/19 for routine follow up and reported daily issues with generalized weakness, word finding/misusing words, tunnel vision, balance and headaches. She had undergone bilateral carotid artery [place] on 05/28/18 which showed 40-59% stenosis of the left ICA. She again underwent an [place] of bilateral carotid arteries on 02/18/19 which showed 60-79% stenosis of the left ICA. After thorough discussion decision was made to proceed with repeat diagnostic cerebral angiogram with further evaluate stenosis for possible future intervention.  Erin [name] reports that symptoms above have persisted and are unchanged from previous visit, she reports daily headaches that are not relieved with over the counter medications. She also reports dyspnea at rest and with exertion which she reports began to occur after she had a heart attack in 2017. She denies any vision changes, trouble  swallowing, facial droop, numbness or tingling. She states understanding of procedure and wishes to proceed.   Past Medical History:  Diagnosis Date   Anxiety state 03/25/2016   Basal cell carcinoma of forehead    CHF (congestive heart failure) ([place])    Coronary artery disease    a. 03/11/16 PCI with DES-->Prox/Mid Cx;  b. 03/14/16 PCI with DES x2-->RCA, EF 30-35%.   Essential hypertension    GERD (gastroesophageal reflux disease)    HFrEF (heart failure with reduced ejection fraction) ([place])    a. 10/2016 Echo: EF 35-40%, Gr1 DD, mild focal basal septal hypertrophy, basal inflat, mid inflat, basal antlat AK. Mid infept/inf/antlat, apical lateral sev HK. Mod MR. mildly reduced RV fxn. Mild TR.   History of pneumonia    Hyperlipidemia    Ischemic cardiomyopathy    a. 10/2016 Echo: EF 35-40%, Gr1 DD.   Mitral regurgitation    NSTEMI (non-ST elevated myocardial infarction) ([place]) 03/10/2016   Squamous cell cancer of skin of nose    Thrombocytosis ([place]) 03/26/2016   Tobacco abuse    Trichimoniasis    Wears dentures    Wears glasses     Past Surgical History:  Procedure Laterality Date   APPENDECTOMY     BIOPSY  09/20/2018   Procedure: BIOPSY;  Surgeon: Daneil Dolin, MD;  [facility];  Service: Endoscopy;;  colon   CARDIAC CATHETERIZATION N/A 03/11/2016   Procedure: Left Heart Cath and Coronary Angiography;  Surgeon: Leonie Man, MD;  Location: [place] [facility];  Service: Cardiovascular;  Laterality: N/A;   CARDIAC CATHETERIZATION N/A 03/11/2016   Procedure: Coronary Stent Intervention;  Surgeon: Leonie Man, MD;  Location: [place] [facility];  Service: Cardiovascular;  Laterality: N/A;   CARDIAC CATHETERIZATION N/A 03/14/2016   Procedure: Coronary Stent Intervention;  Surgeon: Peter M Martinique, MD;  Location: [place] [facility];  Service: Cardiovascular;  Laterality: N/A;   CHOLECYSTECTOMY OPEN  1984   COLONOSCOPY WITH PROPOFOL N/A 09/20/2018    Procedure: COLONOSCOPY WITH PROPOFOL;  Surgeon: Daneil Dolin, MD;  [facility];  Service: Endoscopy;  Laterality: N/A;  10:30am   CORONARY ANGIOPLASTY WITH STENT PLACEMENT  03/14/2016   ESOPHAGOGASTRODUODENOSCOPY (EGD) WITH PROPOFOL N/A 09/20/2018   Procedure: ESOPHAGOGASTRODUODENOSCOPY (EGD) WITH PROPOFOL;  Surgeon: Daneil Dolin, MD;  [facility];  Service: Endoscopy;  Laterality: N/A;   FINGER ARTHROPLASTY Left 05/14/2013   Procedure: LEFT THUMB CARPAL METACARPAL ARTHROPLASTY;  Surgeon: Tennis Must, MD;  Location: [place];  Service: Orthopedics;  Laterality: Left;   IR ANGIO INTRA EXTRACRAN SEL COM CAROTID INNOMINATE BILAT MOD SED  01/05/2017   IR ANGIO VERTEBRAL SEL VERTEBRAL BILAT MOD SED  01/05/2017   IR RADIOLOGIST EVAL & MGMT  12/30/2016   MALONEY DILATION N/A 09/20/2018   Procedure: MALONEY DILATION;  Surgeon: Daneil Dolin, MD;  [facility];  Service: Endoscopy;  Laterality: N/A;   TUBAL LIGATION  1987   VAGINAL HYSTERECTOMY  2009    Allergies: Tape  Medications: Prior to Admission medications   Medication Sig Start Date End Date Taking? Authorizing Provider  acetaminophen (TYLENOL) 325 MG tablet Take 325 mg by mouth every 6 (six) hours as needed for mild pain or headache.    Yes [provider]  ALPRAZolam Duanne Moron) 1 MG tablet Take 1 mg by mouth at bedtime.    Yes [provider]  aspirin EC 81 MG tablet Take 81 mg by mouth every evening.    Yes [provider]  atorvastatin (LIPITOR) 80 MG tablet TAKE 1 TABLET BY MOUTH ONCE DAILY AT  6  IN  THE  EVENING Patient taking differently: Take 80 mg by mouth every evening.  04/03/18  Yes Herminio Commons, MD  ezetimibe (ZETIA) 10 MG tablet Take 1 tablet by mouth once daily Patient taking differently: Take 10 mg by mouth daily.  12/10/18  Yes Herminio Commons, MD  furosemide (LASIX) 20 MG tablet Take 1 tablet (20 mg total) by mouth daily.  05/16/18  Yes Herminio Commons, MD  gabapentin (NEURONTIN) 100 MG capsule Take 100 mg by mouth 3 (three) times daily.   Yes [provider]  loperamide (IMODIUM) 2 MG capsule Take 2 mg by mouth 4 (four) times daily as needed for diarrhea or loose stools (ibs).   Yes [provider]  LORazepam (ATIVAN) 1 MG tablet Take 1 mg by mouth daily as needed for anxiety.  11/09/17  Yes [provider]  metoprolol tartrate (LOPRESSOR) 25 MG tablet Take 0.5 tablets (12.5 mg total) by mouth 2 (two) times daily. 02/22/19 05/23/19 Yes Herminio Commons, MD  Multiple Vitamins-Minerals (MULTIVITAMIN WITH MINERALS) tablet Take 1 tablet by mouth daily.   Yes [provider]  nicotine (NICODERM CQ - DOSED IN MG/24 HOURS) 21 mg/24hr patch Place 21 mg onto the skin daily.   Yes [provider]  nitroGLYCERIN (NITROSTAT) 0.4 MG SL tablet Place 1 tablet (0.4 mg total) under the tongue every 5 (five) minutes x 3 doses as needed for chest pain. 03/05/18  Yes Shah, Pratik D, DO  rOPINIRole (REQUIP) 0.5 MG tablet Take 0.5 mg by mouth at bedtime.  11/13/17  Yes [provider]  sacubitril-valsartan (ENTRESTO) 24-26 MG Take 1 tablet by mouth 2 (two) times daily. 02/22/19  Yes Herminio Commons, MD  traMADol (ULTRAM) 50 MG tablet Take 1 tablet (50 mg total) by mouth every 6 (six) hours as needed. Patient taking differently: Take 50 mg by mouth every 6 (six) hours as needed (pain).  12/22/18  Yes Idol, Almyra Free, PA-C  losartan (COZAAR) 25 MG tablet Take 1 tablet (25 mg total) by mouth daily. 10/12/18 02/22/19  Herminio Commons, MD  ondansetron (ZOFRAN ODT) 4 MG disintegrating tablet Take 1 tablet (4 mg total) by mouth every 8 (eight) hours as needed. Patient not taking: Reported on 03/13/2019 10/23/18   Fredia Sorrow, MD  potassium chloride SA (KLOR-CON M20) 20 MEQ tablet Take 1 tablet (20 mEq total) by mouth daily. 04/11/18   Herminio Commons, MD     Family History    Problem Relation Age of Onset   Diabetes Father    Hypertension Father    CAD Father    Colon polyps Father 58       pre-cancerous    Stroke Father    Stroke Mother    Hypertension Mother    Diabetes Mother    Heart failure Other    Breast cancer Maternal Grandmother    Colon cancer Neg Hx     Social History   Socioeconomic History   Marital status: Married    Spouse name: Not on file   Number of children: Not on file   Years of education: Not on file   Highest education level: Not on file  Occupational History   Occupation: CNA  Social Needs   Financial resource strain: Not on file   Food insecurity    Worry: Not on file    Inability: Not on file   Transportation needs    Medical: Not on file    Non-medical: Not on file  Tobacco Use   Smoking status: Current Some Day Smoker    Packs/day: 0.10    Years: 15.00    Pack years: 1.50    Types: Cigarettes   Smokeless tobacco: Never Used   Tobacco comment: smokes a cigarette occasionally  Substance and Sexual Activity   Alcohol use: Yes    Comment: occasionally   Drug use: Never    Types: Marijuana    Comment: former   Sexual activity: Not Currently    Birth control/protection: Surgical    Comment: hyst  Lifestyle   Physical activity    Days per week: Not on file    Minutes per session: Not on file   Stress: Not on file  Relationships   Social connections    Talks on phone: Not on file    Gets together: Not on file    Attends religious service: Not on file    Active member of club or organization: Not on file    Attends meetings of clubs or organizations: Not on file    Relationship status: Not on file  Other Topics Concern   Not on file  Social History Narrative   Lives with husband in [place] in a one story home with a basement.  Has 4 children.  Works as a Quarry manager.  Education: CNA school.      Review of Systems: A 12 point ROS discussed and pertinent positives are indicated in  the HPI above.  All other systems are negative.  Review of Systems  Constitutional: Positive for fatigue. Negative for chills and fever.  HENT: Negative for hearing loss, tinnitus and trouble swallowing.   Eyes: Negative for pain, redness and visual disturbance.  Respiratory: Positive for shortness of breath. Negative for cough.   Cardiovascular: Negative for chest pain.  Gastrointestinal: Negative for abdominal pain, diarrhea, nausea and vomiting.  Genitourinary: Negative for dysuria.  Musculoskeletal: Negative for gait problem.  Neurological: Positive for dizziness ("when I stand up too fast usually"), speech difficulty (difficulty with word finding), weakness (generalized) and headaches. Negative for seizures, syncope, facial asymmetry and numbness.  Psychiatric/Behavioral: The patient is nervous/anxious.     Vital Signs: BP 132/88    Pulse 91    Temp 98.1 F (36.7 C) (Oral)    Resp 20    Ht 5\' 2"  (1.575 m)    Wt 127 lb (57.6 kg)    SpO2 100%    BMI 23.23 kg/m   Physical Exam Vitals signs reviewed.  Constitutional:      General: She is not in acute distress. HENT:     Head: Normocephalic.     Mouth/Throat:     Mouth: Mucous membranes are moist.     Pharynx: Oropharynx is clear. No oropharyngeal exudate or posterior oropharyngeal erythema.  Cardiovascular:     Rate and Rhythm: Normal rate and regular rhythm.     Pulses: Normal pulses.  Pulmonary:     Effort: Pulmonary effort is normal.     Breath sounds: Normal breath sounds.  Abdominal:     General: Bowel sounds are normal. There is no distension.     Palpations: Abdomen is soft.     Tenderness: There is no abdominal tenderness.  Skin:    General: Skin is warm and dry.  Neurological:     Mental Status: She is alert and oriented to person, place, and time.  Psychiatric:        Mood and Affect: Mood normal.        Behavior: Behavior normal.        Thought Content: Thought content normal.        Judgment: Judgment  normal.      MD Evaluation Airway: WNL Heart: WNL Abdomen: WNL Chest/ Lungs: WNL ASA  Classification: 3 Mallampati/Airway Score: Two   Imaging: Ct Abdomen Pelvis W Contrast  Result Date: 02/17/2019 CLINICAL DATA:  Chronic abdominal pain and diarrhea. Hematochezia this morning. EXAM: CT ABDOMEN AND PELVIS WITH CONTRAST TECHNIQUE: Multidetector CT imaging of the abdomen and pelvis was performed using the standard protocol following bolus administration of intravenous contrast. CONTRAST:  151mL OMNIPAQUE IOHEXOL 300 MG/ML  SOLN COMPARISON:  CT abdomen pelvis dated October 23, 2018. FINDINGS: Lower chest: No acute abnormality. Hepatobiliary: Unchanged 9 mm hemangioma in the peripheral hepatic dome. Two additional subcentimeter low-density lesions in the liver remain too small to characterize but are unchanged. No new focal liver abnormality. Status post cholecystectomy. Stable mild common bile duct dilatation. Pancreas: Unremarkable. No pancreatic ductal dilatation or surrounding inflammatory changes. Spleen: Normal in size without focal abnormality. Adrenals/Urinary Tract: Unchanged bilateral adrenal gland adenomas, larger on the left. No renal lesions, calculi, or hydronephrosis. The bladder is unremarkable. Stomach/Bowel: Stomach is within normal limits. History of appendectomy. No evidence of bowel wall thickening, distention, or inflammatory changes. Vascular/Lymphatic: Aortic atherosclerosis. Stable ectasia of the infrarenal abdominal aorta measuring up to 2.5 cm. No enlarged abdominal or pelvic lymph nodes. Reproductive: Status post hysterectomy. No adnexal masses. Other: Trace free fluid in the pelvis.  No pneumoperitoneum. Musculoskeletal: No acute or significant osseous findings. IMPRESSION: 1.  No acute  intra-abdominal process. 2. Unchanged ectatic infrarenal abdominal aorta measuring up to 2.5 cm. Ectatic abdominal aorta at risk for aneurysm development. Recommend followup by ultrasound in 5  years. This recommendation follows ACR consensus guidelines: White Paper of the ACR Incidental Findings Committee II on Vascular Findings. J Am Coll Radiol 2013; 10:789-794. Aortic aneurysm NOS (ICD10-I71.9) 3.  Aortic atherosclerosis (ICD10-I70.0). Electronically Signed   By: Titus Dubin M.D.   On: 02/17/2019 15:20   Nm Myocar Multi W/spect W/wall Motion / Ef  Result Date: 03/07/2019  There was no ST segment deviation noted during stress.  Findings consistent with large prior inferior/inferolateral/lateral myocardial infarction with mild peri-infarct ischemia.  This is a high risk study. Risk based primarily on low LVEF and large scar, fairly mild current peri-infarct ischemia.  The left ventricular ejection fraction is severely decreased (<30%).    Dg Chest Portable 1 View  Result Date: 02/17/2019 CLINICAL DATA:  Nausea and bright red blood in stool. Diarrhea for 3 months. EXAM: PORTABLE CHEST 1 VIEW COMPARISON:  Single-view of the chest 08/16/2018. FINDINGS: Lungs are clear. Heart size is normal. No pneumothorax or pleural fluid. No acute or focal bony abnormality. IMPRESSION: Negative chest. Electronically Signed   By: Inge Rise M.D.   On: 02/17/2019 12:45   Dg Hand Complete Right  Result Date: 02/26/2019 CLINICAL DATA:  Injured right hand. EXAM: RIGHT HAND - COMPLETE 3+ VIEW COMPARISON:  12/22/2018 FINDINGS: Mild stable degenerative changes are noted most notably at the interphalangeal joint of the thumb. No acute fractures are identified. No radiopaque foreign body. IMPRESSION: Stable appearing degenerative changes but no acute fracture. Electronically Signed   By: Marijo Sanes M.D.   On: 02/26/2019 12:58   Vas US Carotid  Result Date: 02/18/2019 Carotid Arterial Duplex Study Indications:       Rt ica stenosis. Risk Factors:      Hypertension, hyperlipidemia, coronary artery disease. Comparison Study:  previous study done 05/29/18 Performing Technologist: Abram Sander RVS   Examination Guidelines: A complete evaluation includes B-mode imaging, spectral Doppler, color Doppler, and power Doppler as needed of all accessible portions of each vessel. Bilateral testing is considered an integral part of a complete examination. Limited examinations for reoccurring indications may be performed as noted.  Right Carotid Findings: +----------+--------+--------+--------+------------------+--------+             PSV cm/s EDV cm/s Stenosis Plaque Description Comments  +----------+--------+--------+--------+------------------+--------+  CCA Prox   62       16                heterogenous                 +----------+--------+--------+--------+------------------+--------+  CCA Distal 83       26                heterogenous                 +----------+--------+--------+--------+------------------+--------+  ICA Prox   101      37       1-39%    heterogenous                 +----------+--------+--------+--------+------------------+--------+  ICA Distal 90       31                                             +----------+--------+--------+--------+------------------+--------+  ECA        132      35                                             +----------+--------+--------+--------+------------------+--------+ +----------+--------+-------+--------+-------------------+             PSV cm/s EDV cms Describe Arm Pressure (mmHG)  +----------+--------+-------+--------+-------------------+  Subclavian 211                                            +----------+--------+-------+--------+-------------------+ +---------+--------+--+--------+--+---------+  Vertebral PSV cm/s 39 EDV cm/s 18 Antegrade  +---------+--------+--+--------+--+---------+  Left Carotid Findings: +----------+--------+--------+--------+------------------+---------+             PSV cm/s EDV cm/s Stenosis Plaque Description Comments   +----------+--------+--------+--------+------------------+---------+  CCA Prox   74       28                 heterogenous                  +----------+--------+--------+--------+------------------+---------+  CCA Distal 73       32                heterogenous                  +----------+--------+--------+--------+------------------+---------+  ICA Prox   173      89       60-79%   heterogenous       Shadowing  +----------+--------+--------+--------+------------------+---------+  ICA Distal 91       37                                              +----------+--------+--------+--------+------------------+---------+  ECA        95       15                                              +----------+--------+--------+--------+------------------+---------+ +----------+--------+--------+--------+-------------------+             PSV cm/s EDV cm/s Describe Arm Pressure (mmHG)  +----------+--------+--------+--------+-------------------+  Subclavian 133                                             +----------+--------+--------+--------+-------------------+ +---------+--------+--+--------+--+---------+  Vertebral PSV cm/s 62 EDV cm/s 22 Antegrade  +---------+--------+--+--------+--+---------+  Summary: Right Carotid: Velocities in the right ICA are consistent with a 1-39% stenosis. Left Carotid: Velocities in the left ICA are consistent with a 60-79% stenosis. Vertebrals: Bilateral vertebral arteries demonstrate antegrade flow. *See table(s) above for measurements and observations.  Electronically signed by Monica Martinez MD on 02/18/2019 at 7:06:15 PM.    Final     Labs:  CBC: Recent Labs    04/13/18 1500 08/16/18 0240 10/23/18 0645 02/17/19 1154  WBC 8.0 8.8 8.1 8.0  HGB 11.0* 12.3 11.1* 11.2*  HCT 35.3* 37.0 33.7* 35.6*  PLT 367 420* 395 403*  COAGS: No results for input(s): INR, APTT in the last 8760 hours.  BMP: Recent Labs    04/13/18 1500 08/16/18 0240 10/23/18 0645 02/17/19 1154  NA 138 134* 140 138  K 4.0 3.3* 3.4* 4.4  CL 105 98 104 106  CO2 26 25 22 22   GLUCOSE 88 120* 117* 100*  BUN 15  17 15 14   CALCIUM 8.9 9.5 9.5 9.1  CREATININE 0.83 1.11* 0.86 0.70  GFRNONAA >60 55* >60 >60  GFRAA >60 >60 >60 >60    LIVER FUNCTION TESTS: Recent Labs    04/13/18 1500 10/23/18 0645 02/17/19 1154  BILITOT 0.5 0.8 0.5  AST 29 23 21   ALT 22 8 18   ALKPHOS 63 70 75  PROT 6.9 6.9 7.1  ALBUMIN 3.8 3.8 3.8    TUMOR MARKERS: No results for input(s): AFPTM, CEA, CA199, CHROMGRNA in the last 8760 hours.  Assessment and Plan:  58 y/o F with known left ICA stenosis followed by Dr. Estanislado Pandy since 2018 who presents today for previously planned diagnostic cerebral angiogram due to worsening left ICA stenosis and new onset of neurologic symptoms likely related to same.   Patient has been NPO since 8 pm last night, she did not take any medications this morning, she does not take any blood thinning medications besides ASA 81 mg QD. Afebrile, INR 0.9, CBC and BMP pending at the time of this note writing and will be reviewed prior to procedure.  Risks and benefits of diagnostic cerebral angiogram were discussed with the patient including, but not limited to bleeding, infection, vascular injury, stroke, or contrast induced renal failure. This interventional procedure involves the use of X-rays and because of the nature of the planned procedure, it is possible that we will have prolonged use of X-ray fluoroscopy. Potential radiation risks to you include (but are not limited to) the following: - A slightly elevated risk for cancer  several years later in life. This risk is typically less than 0.5% percent. This risk is low in comparison to the normal incidence of human cancer, which is 33% for women and 50% for men according to the [employer]. - Radiation induced injury can include skin redness, resembling a rash, tissue breakdown / ulcers and hair loss (which can be temporary or permanent).  The likelihood of either of these occurring depends on the difficulty of the procedure and  whether you are sensitive to radiation due to previous procedures, disease, or genetic conditions.  IF your procedure requires a prolonged use of radiation, you will be notified and given written instructions for further action.  It is your responsibility to monitor the irradiated area for the 2 weeks following the procedure and to notify your physician if you are concerned that you have suffered a radiation induced injury.    All of the patient's questions were answered, patient is agreeable to proceed.  Consent signed and in chart.  Thank you for this interesting consult.  I greatly enjoyed meeting Erin [name] and look forward to participating in their care.  A copy of this report was sent to the requesting provider on this date.  Electronically Signed: Joaquim Nam, PA-C 03/19/2019, 7:21 AM   I spent a total of  15 Minutes in face to face in clinical consultation, greater than 50% of which was counseling/coordinating care for diagnostic cerebral angiogram.

## 2019-03-19 NOTE — Sedation Documentation (Signed)
Right radial site intact. Good pulse present. Dressing and [place] intact.

## 2019-03-20 ENCOUNTER — Other Ambulatory Visit (HOSPITAL_COMMUNITY): Payer: Self-pay | Admitting: Interventional Radiology

## 2019-03-20 ENCOUNTER — Encounter (HOSPITAL_COMMUNITY): Payer: Self-pay | Admitting: Interventional Radiology

## 2019-03-20 ENCOUNTER — Telehealth (HOSPITAL_COMMUNITY): Payer: Self-pay

## 2019-03-20 DIAGNOSIS — I771 Stricture of artery: Secondary | ICD-10-CM

## 2019-03-20 NOTE — Telephone Encounter (Signed)
Called to schedule consult, no answer, left vm. AW  

## 2019-03-26 ENCOUNTER — Ambulatory Visit (HOSPITAL_COMMUNITY)
Admission: RE | Admit: 2019-03-26 | Discharge: 2019-03-26 | Disposition: A | Discharge status: Home / Self Care | Payer: No Typology Code available for payment source | Source: Ambulatory Visit | Attending: Interventional Radiology | Admitting: Interventional Radiology

## 2019-03-26 ENCOUNTER — Other Ambulatory Visit: Payer: Self-pay

## 2019-03-26 ENCOUNTER — Other Ambulatory Visit: Payer: Self-pay | Admitting: Cardiovascular Disease

## 2019-03-26 DIAGNOSIS — I771 Stricture of artery: Secondary | ICD-10-CM

## 2019-03-26 NOTE — Progress Notes (Signed)
Chief Complaint: Patient was seen in consultation today for follow up cerebral angiogram at the request of Deveshwar,Sanjeev  Referring Physician(s): Deveshwar,Sanjeev  History of Present Illness: Erin [name] is a 58 y.o. female who underwent recent cerebral angiogram on 11/10. She is now here to discuss findings. She feels pretty well after procedure. No complaints of groin or LE pain. Spouse is with her today.  Reviewed PMHx, meds. Hx of CAD with prior coronary stents in 2017, had to be on Plavix x 1 year. Saw her cardiologist about a month ago and underwent NM Myoview which did not show amy new ischemic changes. She also had new Echo, which did show decreased EF, but still in the 35-40% range. Changes to her BP meds were noted on Dr. Bronson Ing note. Remains on ASA daily  She does continue to smoke some daily.  Past Medical History:  Diagnosis Date   Anxiety state 03/25/2016   Basal cell carcinoma of forehead    CHF (congestive heart failure) ([place])    Coronary artery disease    a. 03/11/16 PCI with DES-->Prox/Mid Cx;  b. 03/14/16 PCI with DES x2-->RCA, EF 30-35%.   Essential hypertension    GERD (gastroesophageal reflux disease)    HFrEF (heart failure with reduced ejection fraction) ([place])    a. 10/2016 Echo: EF 35-40%, Gr1 DD, mild focal basal septal hypertrophy, basal inflat, mid inflat, basal antlat AK. Mid infept/inf/antlat, apical lateral sev HK. Mod MR. mildly reduced RV fxn. Mild TR.   History of pneumonia    Hyperlipidemia    Ischemic cardiomyopathy    a. 10/2016 Echo: EF 35-40%, Gr1 DD.   Mitral regurgitation    NSTEMI (non-ST elevated myocardial infarction) ([place]) 03/10/2016   Squamous cell cancer of skin of nose    Thrombocytosis ([place]) 03/26/2016   Tobacco abuse    Trichimoniasis    Wears dentures    Wears glasses     Past Surgical History:  Procedure Laterality Date   APPENDECTOMY     BIOPSY  09/20/2018   Procedure: BIOPSY;   Surgeon: Daneil Dolin, MD;  [facility];  Service: Endoscopy;;  colon   CARDIAC CATHETERIZATION N/A 03/11/2016   Procedure: Left Heart Cath and Coronary Angiography;  Surgeon: Leonie Man, MD;  Location: [facility];  Service: Cardiovascular;  Laterality: N/A;   CARDIAC CATHETERIZATION N/A 03/11/2016   Procedure: Coronary Stent Intervention;  Surgeon: Leonie Man, MD;  Location: [facility];  Service: Cardiovascular;  Laterality: N/A;   CARDIAC CATHETERIZATION N/A 03/14/2016   Procedure: Coronary Stent Intervention;  Surgeon: Peter M Martinique, MD;  Location: [facility];  Service: Cardiovascular;  Laterality: N/A;   CHOLECYSTECTOMY OPEN  1984   COLONOSCOPY WITH PROPOFOL N/A 09/20/2018   Procedure: COLONOSCOPY WITH PROPOFOL;  Surgeon: Daneil Dolin, MD;  [facility];  Service: Endoscopy;  Laterality: N/A;  10:30am   CORONARY ANGIOPLASTY WITH STENT PLACEMENT  03/14/2016   ESOPHAGOGASTRODUODENOSCOPY (EGD) WITH PROPOFOL N/A 09/20/2018   Procedure: ESOPHAGOGASTRODUODENOSCOPY (EGD) WITH PROPOFOL;  Surgeon: Daneil Dolin, MD;  [facility];  Service: Endoscopy;  Laterality: N/A;   FINGER ARTHROPLASTY Left 05/14/2013   Procedure: LEFT THUMB CARPAL METACARPAL ARTHROPLASTY;  Surgeon: Tennis Must, MD;  Location: [facility];  Service: Orthopedics;  Laterality: Left;   IR ANGIO INTRA EXTRACRAN SEL COM CAROTID INNOMINATE BILAT MOD SED  01/05/2017   IR ANGIO INTRA EXTRACRAN SEL COM CAROTID INNOMINATE BILAT  MOD SED  03/19/2019   IR ANGIO VERTEBRAL SEL VERTEBRAL BILAT MOD SED  01/05/2017   IR ANGIO VERTEBRAL SEL VERTEBRAL BILAT MOD SED  03/19/2019   IR RADIOLOGIST EVAL & MGMT  12/30/2016   IR US GUIDE VASC ACCESS RIGHT  03/19/2019   MALONEY DILATION N/A 09/20/2018   Procedure: Venia Minks DILATION;  Surgeon: Daneil Dolin, MD;  [facility];  Service: Endoscopy;  Laterality: N/A;   TUBAL LIGATION  1987    VAGINAL HYSTERECTOMY  2009    Allergies: Tape  Medications: Prior to Admission medications   Medication Sig Start Date End Date Taking? Authorizing Provider  acetaminophen (TYLENOL) 325 MG tablet Take 325 mg by mouth every 6 (six) hours as needed for mild pain or headache.     [provider]  ALPRAZolam Duanne Moron) 1 MG tablet Take 1 mg by mouth at bedtime.     [provider]  aspirin EC 81 MG tablet Take 81 mg by mouth every evening.     [provider]  atorvastatin (LIPITOR) 80 MG tablet TAKE 1 TABLET BY MOUTH ONCE DAILY AT  6  IN  THE  EVENING Patient taking differently: Take 80 mg by mouth every evening.  04/03/18   Herminio Commons, MD  ezetimibe (ZETIA) 10 MG tablet Take 1 tablet by mouth once daily Patient taking differently: Take 10 mg by mouth daily.  12/10/18   Herminio Commons, MD  furosemide (LASIX) 20 MG tablet Take 1 tablet by mouth once daily 03/26/19   Herminio Commons, MD  gabapentin (NEURONTIN) 100 MG capsule Take 100 mg by mouth 3 (three) times daily.    [provider]  loperamide (IMODIUM) 2 MG capsule Take 2 mg by mouth 4 (four) times daily as needed for diarrhea or loose stools (ibs).    [provider]  LORazepam (ATIVAN) 1 MG tablet Take 1 mg by mouth daily as needed for anxiety.  11/09/17   [provider]  losartan (COZAAR) 25 MG tablet Take 1 tablet (25 mg total) by mouth daily. 10/12/18 02/22/19  Herminio Commons, MD  metoprolol tartrate (LOPRESSOR) 25 MG tablet Take 0.5 tablets (12.5 mg total) by mouth 2 (two) times daily. 02/22/19 05/23/19  Herminio Commons, MD  Multiple Vitamins-Minerals (MULTIVITAMIN WITH MINERALS) tablet Take 1 tablet by mouth daily.    [provider]  nicotine (NICODERM CQ - DOSED IN MG/24 HOURS) 21 mg/24hr patch Place 21 mg onto the skin daily.    [provider]  nitroGLYCERIN (NITROSTAT) 0.4 MG SL tablet Place 1 tablet (0.4 mg total) under the tongue  every 5 (five) minutes x 3 doses as needed for chest pain. 03/05/18   Manuella Ghazi, Pratik D, DO  ondansetron (ZOFRAN ODT) 4 MG disintegrating tablet Take 1 tablet (4 mg total) by mouth every 8 (eight) hours as needed. Patient not taking: Reported on 03/13/2019 10/23/18   Fredia Sorrow, MD  potassium chloride SA (KLOR-CON M20) 20 MEQ tablet Take 1 tablet (20 mEq total) by mouth daily. 04/11/18   Herminio Commons, MD  rOPINIRole (REQUIP) 0.5 MG tablet Take 0.5 mg by mouth at bedtime.  11/13/17   [provider]  sacubitril-valsartan (ENTRESTO) 24-26 MG Take 1 tablet by mouth 2 (two) times daily. 02/22/19   Herminio Commons, MD  traMADol (ULTRAM) 50 MG tablet Take 1 tablet (50 mg total) by mouth every 6 (six) hours as needed. Patient taking differently: Take 50 mg by mouth every 6 (  six) hours as needed (pain).  12/22/18   Evalee Jefferson, PA-C     Family History  Problem Relation Age of Onset   Diabetes Father    Hypertension Father    CAD Father    Colon polyps Father 88       pre-cancerous    Stroke Father    Stroke Mother    Hypertension Mother    Diabetes Mother    Heart failure Other    Breast cancer Maternal Grandmother    Colon cancer Neg Hx     Social History   Socioeconomic History   Marital status: Married    Spouse name: Not on file   Number of children: Not on file   Years of education: Not on file   Highest education level: Not on file  Occupational History   Occupation: CNA  Social Needs   Financial resource strain: Not on file   Food insecurity    Worry: Not on file    Inability: Not on file   Transportation needs    Medical: Not on file    Non-medical: Not on file  Tobacco Use   Smoking status: Current Some Day Smoker    Packs/day: 0.10    Years: 15.00    Pack years: 1.50    Types: Cigarettes   Smokeless tobacco: Never Used   Tobacco comment: smokes a cigarette occasionally  Substance and Sexual Activity   Alcohol use: Yes     Comment: occasionally   Drug use: Never    Types: Marijuana    Comment: former   Sexual activity: Not Currently    Birth control/protection: Surgical    Comment: hyst  Lifestyle   Physical activity    Days per week: Not on file    Minutes per session: Not on file   Stress: Not on file  Relationships   Social connections    Talks on phone: Not on file    Gets together: Not on file    Attends religious service: Not on file    Active member of club or organization: Not on file    Attends meetings of clubs or organizations: Not on file    Relationship status: Not on file  Other Topics Concern   Not on file  Social History Narrative   Lives with husband in [place] in a one story home with a basement.  Has 4 children.  Works as a [employer]ar[employer] manager.  Education: CNA school.     Review of Systems: A 12 point ROS discussed and pertinent positives are indicated in the HPI above.  All other systems are negative.  Review of Systems  Vital Signs: There were no vitals taken for this visit.  Physical Exam Constitutional:      General: She is not in acute distress.    Appearance: Normal appearance. She is not ill-appearing.  HENT:     Mouth/Throat:     Mouth: Mucous membranes are moist.     Pharynx: Oropharynx is clear.  Neck:     Musculoskeletal: Normal range of motion and neck supple. No neck rigidity.     Vascular: No carotid bruit.  Cardiovascular:     Rate and Rhythm: Normal rate and regular rhythm.     Pulses: Normal pulses.     Heart sounds: Normal heart sounds.  Pulmonary:     Effort: Pulmonary effort is normal. No respiratory distress.     Breath sounds: Normal breath sounds. No rhonchi or rales.  Abdominal:  General: Abdomen is flat. There is no distension.     Palpations: Abdomen is soft.     Tenderness: There is no abdominal tenderness.  Skin:    General: Skin is warm and dry.     Coloration: Skin is not pale.     Findings: No bruising.  Neurological:     General: No  focal deficit present.     Mental Status: She is alert and oriented to person, place, and time. Mental status is at baseline.     Cranial Nerves: No cranial nerve deficit.     Sensory: No sensory deficit.     Motor: No weakness.     Gait: Gait normal.  Psychiatric:        Mood and Affect: Mood normal.        Judgment: Judgment normal.      Imaging: Nm Myocar Multi W/spect W/wall Motion / Ef  Result Date: 03/07/2019  There was no ST segment deviation noted during stress.  Findings consistent with large prior inferior/inferolateral/lateral myocardial infarction with mild peri-infarct ischemia.  This is a high risk study. Risk based primarily on low LVEF and large scar, fairly mild current peri-infarct ischemia.  The left ventricular ejection fraction is severely decreased (<30%).    Ir US Guide Vasc Access Right  Result Date: 03/20/2019 CLINICAL DATA:  History of worsening headaches over the past few months. The headaches almost constant generalized and pounding. Previous history of bilateral posterior communicating artery region outpouchings. EXAM: IR ANGIO VERTEBRAL SEL VERTEBRAL BILAT MOD SED; BILATERAL COMMON CAROTID AND INNOMINATE ANGIOGRAPHY; IR ULTRASOUND GUIDANCE VASC ACCESS RIGHT COMPARISON:  Diagnostic catheter arteriogram of January 05, 2017. MEDICATIONS: Heparin 1000 units IV. No antibiotic was administered within 1 hour of the procedure. ANESTHESIA/SEDATION: Versed 1.5 mg IV; Fentanyl 37.5 mcg IV Moderate Sedation Time:  68 minutes The patient was continuously monitored during the procedure by the interventional radiology nurse under my direct supervision. CONTRAST:  Isovue-300 approximately 60 mL. FLUOROSCOPY TIME:  Fluoroscopy Time: 9 minutes 18 seconds (621 mGy). COMPLICATIONS: None immediate. TECHNIQUE: Informed written consent was obtained from the patient after a thorough discussion of the procedural risks, benefits and alternatives. All questions were addressed. Maximal  Sterile Barrier Technique was utilized including caps, mask, sterile gowns, sterile gloves, sterile drape, hand hygiene and skin antiseptic. A timeout was performed prior to the initiation of the procedure. The right radial artery initially was evaluated with palpation and ultrasound. A dorsal palmar anastomosis was verified. Documentation of the right radial artery was obtained with ultrasound. Access was obtained into the right radial artery with a micropuncture set and exchanged over a 0.018 inch micro wire for a 4/5 French radial sheath. However, there was no back bleed obtained from the side port of the sheath. An ultrasound evaluation demonstrated severe spasm. This route was then abandoned. Hemostasis was achieved at the right radial puncture site with a pressure dressing. The right groin was prepped and draped in the usual sterile fashion. Thereafter using modified Seldinger technique, transfemoral access into the right common femoral artery was obtained without difficulty. Over a 0.035 inch guidewire, a 5 French Pinnacle sheath was inserted. Through this, and also over 0.035 inch guidewire, a 5 [place] JB 1 catheter was advanced to the aortic arch region and selectively positioned in the right common carotid artery, the right vertebral artery, the left common carotid artery and the left vertebral artery. FINDINGS: The innominate artery arteriogram demonstrates approximately 60%-65% stenosis of the proximal right subclavian artery. The  right vertebral artery origin is widely patent. The vessel is seen to opacify to the cranial skull base. Wide patency is seen of the right posterior-inferior cerebellar artery and the right vertebrobasilar junction. The opacified portion of the basilar artery, the posterior cerebral arteries, the superior cerebellar arteries and the anterior-inferior cerebellar arteries is seen into the capillary and venous phases. Unopacified blood is seen in the basilar artery from the  contralateral vertebral artery. The right common carotid arteriogram demonstrates severe narrowing at the origin of the right external carotid artery. Its branches, however, opacify. The right internal carotid artery at the bulb has a shallow shelf-like plaque with less than 10% stenosis by the NASCET criteria. No evidence of acute ulcerations are seen. More distally the vessel is seen to opacify to the cranial skull base. The petrous segment is widely patent. Mild atherosclerotic narrowing is seen of the caval cavernous segment. The supraclinoid segment is widely patent. The right middle cerebral artery and the right anterior cerebral artery opacify into the capillary and venous phases. Arising again in the right posterior communicating artery region is a saccular outpouching with a vessel emanating at its apex. This measures approximately 3 mm x 2.5 mm. The right vertebral artery origin is widely patent. There is mild tortuosity just distal to its origin. More distally the left vertebral artery is seen to opacify to the cranial skull base. Wide patency is seen of the left posterior-inferior cerebellar artery and the left vertebrobasilar junction. The basilar artery, the posterior cerebral arteries, the superior cerebellar arteries and the anterior-inferior cerebellar arteries opacify into the capillary and venous phases. There is transverse retrograde opacification of the PCOMs bilaterally. The left common carotid arteriogram demonstrates significant narrowing at the origin of the left external carotid artery secondary to an atherosclerotic plaque. The left internal carotid artery demonstrates a narrowing of approximately 60-65% by the NASCET criteria without evidence of acute ulcerations or of intraluminal filling defects secondary to a segmental cap like plaque. More distally the left internal carotid artery is seen to opacify to the cranial skull base. Wide patency is seen of the petrous, cavernous and the  supraclinoid segments. The left middle cerebral artery and the left anterior cerebral artery opacify into the capillary and venous phases. Again seen arising in the left posterior communicating artery region is a saccular outpouching projecting inferiorly and also posteriorly. This measures approximately 3.7 mm x 2.5 mm. IMPRESSION: The left posterior communicating artery region outpouching measuring approximately 3.7 mm x 2.5 mm. Compared to the previous arteriogram of 2018, there has been an increase in the size of this outpouching probably representing an aneurysm with no discrete vessel seen emanating from the fundus of this outpouching. The right posterior communicating artery region outpouching measuring 3 mm x 2.5 mm is essentially stable compared to the examination of August 2018. Approximately 60-65% stenosis of the left internal carotid artery at its origin by the NASCET criteria. This appears slightly worse compared to the August 2018 angiogram. Stable approximately 60-65% stenosis of the proximal right subclavian artery. PLAN: Findings reviewed with the patient. Patient reports to having stopped smoking approximately a year ago. To be follow-up in clinic to discuss management of the above angiographic findings to be scheduled as soon as possible. Electronically Signed   By: Luanne Bras M.D.   On: 03/19/2019 11:40    Labs:  CBC: Recent Labs    08/16/18 0240 10/23/18 0645 02/17/19 1154 03/19/19 0710  WBC 8.8 8.1 8.0 7.2  HGB 12.3 11.1*  11.2* 12.3  HCT 37.0 33.7* 35.6* 38.6  PLT 420* 395 403* 423*    COAGS: Recent Labs    03/19/19 0710  INR 0.9    BMP: Recent Labs    08/16/18 0240 10/23/18 0645 02/17/19 1154 03/19/19 0710  NA 134* 140 138 140  K 3.3* 3.4* 4.4 4.2  CL 98 104 106 108  CO2 25 22 22 23   GLUCOSE 120* 117* 100* 104*  BUN 17 15 14 12   CALCIUM 9.5 9.5 9.1 9.4  CREATININE 1.11* 0.86 0.70 0.80  GFRNONAA 55* >60 >60 >60  GFRAA >60 >60 >60 >60    LIVER  FUNCTION TESTS: Recent Labs    04/13/18 1500 10/23/18 0645 02/17/19 1154  BILITOT 0.5 0.8 0.5  AST 29 23 21   ALT 22 8 18   ALKPHOS 63 70 75  PROT 6.9 6.9 7.1  ALBUMIN 3.8 3.8 3.8    TUMOR MARKERS: No results for input(s): AFPTM, CEA, CA199, CHROMGRNA in the last 8760 hours.  Assessment and Plan: Enlarging (L)PCOM aneurysm. Stable (R)PCOM aneurysm. Have thoroughly discussed options of conservative observation vs intervention. Pt opts to proceed with intervention. Plan for repeat angiogram with planned intervention/pipeline flow diverting stent placement in the coming weeks. She will try to quit smoking as much as possible. Continue all medications as prescribed. In addition, we will give the pt a Rx for Plavix 75 mg daily to start taking about 7 days prior to scheduled procedure date. She has taken Plavix in the past and tolerated it well. Pt and her husband agree with plan as discussed.  Given her recent NM Myoview and Echo, I don't think she'll need anything else from a cardiac standpoint but will communicate with her cardiologist.   Thank you for this interesting consult.  I greatly enjoyed meeting Erin [name] and look forward to participating in their care.  A copy of this report was sent to the requesting provider on this date.  Electronically Signed: Ascencion Dike 03/26/2019, 1:07 PM   I spent a total of 25 minutes in face to face in clinical consultation, greater than 50% of which was counseling/coordinating care for (L)PCOM aneurysm

## 2019-04-01 ENCOUNTER — Telehealth (HOSPITAL_COMMUNITY): Payer: Self-pay

## 2019-04-01 NOTE — Telephone Encounter (Signed)
Returned pt's call, no answer, left vm. AW  

## 2019-04-02 ENCOUNTER — Other Ambulatory Visit (HOSPITAL_COMMUNITY): Payer: Self-pay | Admitting: Interventional Radiology

## 2019-04-02 DIAGNOSIS — I671 Cerebral aneurysm, nonruptured: Secondary | ICD-10-CM

## 2019-04-09 HISTORY — PX: CEREBRAL ANEURYSM REPAIR: SHX164

## 2019-04-10 ENCOUNTER — Other Ambulatory Visit: Payer: Self-pay | Admitting: Cardiovascular Disease

## 2019-04-11 ENCOUNTER — Other Ambulatory Visit: Payer: Self-pay

## 2019-04-11 ENCOUNTER — Other Ambulatory Visit (HOSPITAL_COMMUNITY)
Admission: RE | Admit: 2019-04-11 | Discharge: 2019-04-11 | Disposition: A | Discharge status: Home / Self Care | Payer: PRIVATE HEALTH INSURANCE | Source: Ambulatory Visit | Attending: Interventional Radiology | Admitting: Interventional Radiology

## 2019-04-11 ENCOUNTER — Encounter (HOSPITAL_COMMUNITY): Payer: Self-pay | Admitting: *Deleted

## 2019-04-11 DIAGNOSIS — Z20828 Contact with and (suspected) exposure to other viral communicable diseases: Secondary | ICD-10-CM | POA: Insufficient documentation

## 2019-04-11 DIAGNOSIS — Z01812 Encounter for preprocedural laboratory examination: Secondary | ICD-10-CM | POA: Diagnosis not present

## 2019-04-11 NOTE — Progress Notes (Signed)
Erin [name] denies chest pain or shortness of breath. Patient was tested for Covid today and is in quarantine at home with family

## 2019-04-12 ENCOUNTER — Other Ambulatory Visit: Payer: Self-pay | Admitting: Radiology

## 2019-04-12 ENCOUNTER — Encounter: Payer: Self-pay | Admitting: Cardiovascular Disease

## 2019-04-12 ENCOUNTER — Telehealth (INDEPENDENT_AMBULATORY_CARE_PROVIDER_SITE_OTHER): Payer: No Typology Code available for payment source | Admitting: Cardiovascular Disease

## 2019-04-12 VITALS — BP 129/96 | HR 93 | Ht 60.0 in | Wt 127.0 lb

## 2019-04-12 DIAGNOSIS — I25118 Atherosclerotic heart disease of native coronary artery with other forms of angina pectoris: Secondary | ICD-10-CM | POA: Diagnosis not present

## 2019-04-12 DIAGNOSIS — I5042 Chronic combined systolic (congestive) and diastolic (congestive) heart failure: Secondary | ICD-10-CM

## 2019-04-12 DIAGNOSIS — E785 Hyperlipidemia, unspecified: Secondary | ICD-10-CM

## 2019-04-12 DIAGNOSIS — I6522 Occlusion and stenosis of left carotid artery: Secondary | ICD-10-CM

## 2019-04-12 DIAGNOSIS — I10 Essential (primary) hypertension: Secondary | ICD-10-CM

## 2019-04-12 NOTE — Progress Notes (Signed)
Virtual Visit via Telephone Note   This visit type was conducted due to national recommendations for restrictions regarding the COVID-19 Pandemic (e.g. social distancing) in an effort to limit this patient's exposure and mitigate transmission in our community.  Due to her co-morbid illnesses, this patient is at least at moderate risk for complications without adequate follow up.  This format is felt to be most appropriate for this patient at this time.  The patient did not have access to video technology/had technical difficulties with video requiring transitioning to audio format only (telephone).  All issues noted in this document were discussed and addressed.  No physical exam could be performed with this format.  Please refer to the patient's chart for her  consent to telehealth for [facility].   Date:  04/12/2019   ID:  Erin [name], DOB 1961/02/28, MRN [id number]  Patient Location: Home Provider Location: Home  PCP:  [name], MD  Cardiologist:  Kate Sable, MD  Electrophysiologist:  None   Evaluation Performed:  Follow-Up Visit  Chief Complaint:  CAD, CHF  History of Present Illness:    Erin [name] is a 58 y.o. female with past medical history of CAD (s/p DES to LCx with staged PCI/DESx2 to RCA/PDA in 03/2016, NST in 03/2017 showing large scar with minimal peri-infarct ischemia), chronic combined systolic and diastolic CHF,HTN,andHLD.  She was complaining of increasing chest pain at her last visit.  I have ordered a stress test and an echocardiogram.  Nuclear stress test showed large prior inferior/inferolateral/lateral myocardial infarction with mild peri-infarct ischemia.  LVEF was severely decreased.  Echocardiogram on 03/07/2019 demonstrated moderately induced LV systolic function, LVEF 35 to 40%.  There was grade 2 diastolic dysfunction wall motion abnormalities.  Carotid Dopplers on 02/18/2019 showed 60 to 79% left ICA stenosis.  She is doing much  better from a cardiac standpoint and denies chest pain altogether.  She denies leg swelling, orthopnea and paroxysmal nocturnal dyspnea.  She plans to undergo intervention for an enlarging left PCOM aneurysm next week.  Past Medical History:  Diagnosis Date  . Anxiety state 03/25/2016  . Basal cell carcinoma of forehead   . CHF (congestive heart failure) ([place])   . Coronary artery disease    a. 03/11/16 PCI with DES-->Prox/Mid Cx;  b. 03/14/16 PCI with DES x2-->RCA, EF 30-35%.  . Essential hypertension   . GERD (gastroesophageal reflux disease)   . HFrEF (heart failure with reduced ejection fraction) ([place])    a. 10/2016 Echo: EF 35-40%, Gr1 DD, mild focal basal septal hypertrophy, basal inflat, mid inflat, basal antlat AK. Mid infept/inf/antlat, apical lateral sev HK. Mod MR. mildly reduced RV fxn. Mild TR.  [name] Kitchen History of pneumonia   . Hyperlipidemia   . IBS (irritable bowel syndrome)    diarrhea  . Ischemic cardiomyopathy    a. 10/2016 Echo: EF 35-40%, Gr1 DD.  [name] Kitchen Mitral regurgitation   . NSTEMI (non-ST elevated myocardial infarction) ([place]) 03/10/2016  . Pneumonia 03/2016  . Squamous cell cancer of skin of nose   . Thrombocytosis ([place]) 03/26/2016  . Tobacco abuse   . Trichimoniasis   . Wears dentures   . Wears glasses    Past Surgical History:  Procedure Laterality Date  . APPENDECTOMY    . BIOPSY  09/20/2018   Procedure: BIOPSY;  Surgeon: Daneil Dolin, MD;  [facility];  Service: Endoscopy;;  colon  . CARDIAC CATHETERIZATION N/A 03/11/2016   Procedure: Left Heart Cath and Coronary Angiography;  Surgeon: Leonie Man, MD;  Location: [facility];  Service: Cardiovascular;  Laterality: N/A;  . CARDIAC CATHETERIZATION N/A 03/11/2016   Procedure: Coronary Stent Intervention;  Surgeon: Leonie Man, MD;  Location: [place] [facility];  Service: Cardiovascular;  Laterality: N/A;  . CARDIAC CATHETERIZATION N/A 03/14/2016   Procedure: Coronary Stent Intervention;   Surgeon: Peter M Martinique, MD;  Location: [facility] [facility];  Service: Cardiovascular;  Laterality: N/A;  . CHOLECYSTECTOMY OPEN  1984  . COLONOSCOPY WITH PROPOFOL N/A 09/20/2018   Procedure: COLONOSCOPY WITH PROPOFOL;  Surgeon: Daneil Dolin, MD;  [facility];  Service: Endoscopy;  Laterality: N/A;  10:30am  . CORONARY ANGIOPLASTY WITH STENT PLACEMENT  03/14/2016  . ESOPHAGOGASTRODUODENOSCOPY (EGD) WITH PROPOFOL N/A 09/20/2018   Procedure: ESOPHAGOGASTRODUODENOSCOPY (EGD) WITH PROPOFOL;  Surgeon: Daneil Dolin, MD;  [facility];  Service: Endoscopy;  Laterality: N/A;  . FINGER ARTHROPLASTY Left 05/14/2013   Procedure: LEFT THUMB CARPAL METACARPAL ARTHROPLASTY;  Surgeon: Tennis Must, MD;  Location: [facility];  Service: Orthopedics;  Laterality: Left;  . IR ANGIO INTRA EXTRACRAN SEL COM CAROTID INNOMINATE BILAT MOD SED  01/05/2017  . IR ANGIO INTRA EXTRACRAN SEL COM CAROTID INNOMINATE BILAT MOD SED  03/19/2019  . IR ANGIO VERTEBRAL SEL VERTEBRAL BILAT MOD SED  01/05/2017  . IR ANGIO VERTEBRAL SEL VERTEBRAL BILAT MOD SED  03/19/2019  . IR RADIOLOGIST EVAL & MGMT  12/30/2016  . IR US GUIDE VASC ACCESS RIGHT  03/19/2019  . MALONEY DILATION N/A 09/20/2018   Procedure: Venia Minks DILATION;  Surgeon: Daneil Dolin, MD;  [facility];  Service: Endoscopy;  Laterality: N/A;  . TUBAL LIGATION  1987  . VAGINAL HYSTERECTOMY  2009     Current Meds  Medication Sig  . acetaminophen (TYLENOL) 325 MG tablet Take 325 mg by mouth every 6 (six) hours as needed for mild pain or headache.   . ALPRAZolam (XANAX) 1 MG tablet Take 1 mg by mouth at bedtime.   [name] Kitchen aspirin EC 81 MG tablet Take 81 mg by mouth every evening.   [name] Kitchen atorvastatin (LIPITOR) 80 MG tablet TAKE 1 TABLET BY MOUTH ONCE DAILY AT  6  IN  THE  EVENING  . calcium carbonate (TUMS - DOSED IN MG ELEMENTAL CALCIUM) 500 MG chewable tablet Chew 1 tablet by mouth 3 times/day as needed-between meals & bedtime for  indigestion or heartburn.  . clopidogrel (PLAVIX) 75 MG tablet Take 75 mg by mouth daily.  [name] Kitchen ezetimibe (ZETIA) 10 MG tablet Take 1 tablet by mouth once daily  . furosemide (LASIX) 20 MG tablet Take 1 tablet by mouth once daily  . gabapentin (NEURONTIN) 100 MG capsule Take 100 mg by mouth 3 (three) times daily.  [name] Kitchen loperamide (IMODIUM) 2 MG capsule Take 2 mg by mouth 4 (four) times daily as needed for diarrhea or loose stools (ibs).  . LORazepam (ATIVAN) 1 MG tablet Take 1 mg by mouth daily as needed for anxiety.   . metoprolol tartrate (LOPRESSOR) 25 MG tablet Take 0.5 tablets (12.5 mg total) by mouth 2 (two) times daily.  . Multiple Vitamins-Minerals (MULTIVITAMIN WITH MINERALS) tablet Take 1 tablet by mouth daily.  . nicotine (NICODERM CQ - DOSED IN MG/24 HOURS) 21 mg/24hr patch Place 21 mg onto the skin daily.  . nitroGLYCERIN (NITROSTAT) 0.4 MG SL tablet Place 1 tablet (0.4 mg total) under the tongue every 5 (five) minutes x 3 doses as needed for  chest pain.  [name] Kitchen ondansetron (ZOFRAN ODT) 4 MG disintegrating tablet Take 1 tablet (4 mg total) by mouth every 8 (eight) hours as needed.  . potassium chloride SA (KLOR-CON) 20 MEQ tablet Take 1 tablet by mouth once daily  . rOPINIRole (REQUIP) 0.5 MG tablet Take 0.5 mg by mouth at bedtime.   . sacubitril-valsartan (ENTRESTO) 24-26 MG Take 1 tablet by mouth 2 (two) times daily.  . traMADol (ULTRAM) 50 MG tablet Take 1 tablet (50 mg total) by mouth every 6 (six) hours as needed. (Patient taking differently: Take 50 mg by mouth every 6 (six) hours as needed (pain). )     Allergies:   Tape   Social History   Tobacco Use  . Smoking status: Current Some Day Smoker    Packs/day: 0.10    Years: 15.00    Pack years: 1.50    Types: Cigarettes  . Smokeless tobacco: Never Used  . Tobacco comment: smokes a cigarette occasionally  Substance Use Topics  . Alcohol use: Not Currently    Comment: occasionally  . Drug use: Not Currently    Types: Marijuana     Comment: former- 2017 last time     Family Hx: The patient's family history includes Breast cancer in her maternal grandmother; CAD in her father; Colon polyps (age of onset: 20) in her father; Dementia in her father; Diabetes in her father and mother; Heart failure in an other family member; Hypertension in her father and mother; Stroke in her father and mother. There is no history of Colon cancer.  ROS:   Please see the history of present illness.     All other systems reviewed and are negative.   Prior CV studies:   The following studies were reviewed today:  Echo 03/07/19:   1. Left ventricular ejection fraction, by visual estimation, is 35 to 40%. The left ventricle has moderately decreased function. There is mildly increased left ventricular hypertrophy.  2. Basal and mid inferolateral wall, mid anterolateral segment, and mid inferior segment are abnormal.  3. Elevated left ventricular end-diastolic pressure.  4. Left ventricular diastolic parameters are consistent with Grade II diastolic dysfunction (pseudonormalization).  5. Global right ventricle has normal systolic function.The right ventricular size is normal. No increase in right ventricular wall thickness.  6. Left atrial size was moderately dilated.  7. Right atrial size was normal.  8. Mild mitral annular calcification.  9. The mitral valve is degenerative. Mild mitral valve regurgitation. 10. The tricuspid valve is grossly normal. Tricuspid valve regurgitation is mild. 11. The aortic valve is tricuspid. Aortic valve regurgitation is not visualized. No evidence of aortic valve sclerosis or stenosis. 12. The pulmonic valve was grossly normal. Pulmonic valve regurgitation is not visualized. 13. Normal pulmonary artery systolic pressure. 14. The inferior vena cava is normal in size with greater than 50% respiratory variability, suggesting right atrial pressure of 3 mmHg.   Stress test 03/07/19:   There was no ST  segment deviation noted during stress.  Findings consistent with large prior inferior/inferolateral/lateral myocardial infarction with mild peri-infarct ischemia.  This is a high risk study. Risk based primarily on low LVEF and large scar, fairly mild current peri-infarct ischemia.  The left ventricular ejection fraction is severely decreased (<30%).    Labs/Other Tests and Data Reviewed:    EKG:  No ECG reviewed.  Recent Labs: 10/23/2018: B Natriuretic Peptide 119.0 02/17/2019: ALT 18 03/19/2019: BUN 12; Creatinine, Ser 0.80; Hemoglobin 12.3; Platelets 423; Potassium 4.2; Sodium 140  Recent Lipid Panel Lab Results  Component Value Date/Time   CHOL 179 03/19/2017 06:43 AM   TRIG 245 (H) 03/19/2017 06:43 AM   HDL 49 03/19/2017 06:43 AM   CHOLHDL 3.7 03/19/2017 06:43 AM   LDLCALC 81 03/19/2017 06:43 AM    Wt Readings from Last 3 Encounters:  04/12/19 127 lb (57.6 kg)  03/19/19 127 lb (57.6 kg)  02/26/19 127 lb (57.6 kg)     Objective:    Vital Signs:  BP (!) 129/96   Pulse 93   Ht 5' (1.524 m)   Wt 127 lb (57.6 kg)   BMI 24.80 kg/m    VITAL SIGNS:  reviewed  ASSESSMENT & PLAN:    1. Coronary artery disease:  Symptomatically improved.  Currently on aspirin,atorvastatin,Lopressor 12.5 mg bid and Zetia.    Stress test reviewed above demonstrated prior infarct with mild peri-infarct ischemia.  LVEF 35 to 40% by echocardiogram.  She is on Plavix for upcoming left PCOM aneurysm intervention.  2. Hypertension: Blood pressure is mildly elevated.    Continue Lopressor and Entresto.  3. Hyperlipidemia: Followed by PCP. Continue Lipitor 80 mg and Zetia.  4. Cardiomyopathy/chronic combined systolic and diastolic heart failure:Currently on Lopressor and Entresto.  She takes Lasix 20 mg daily.  LVEF 35-40 %. She has been instructed to take an extra 20 mg of Lasix for weight increase of 3 pounds in 24 hours.  5.  Left internal carotid artery stenosis: 60 to 79%  stenosis by Dopplers on 02/18/2019.  This is being followed by Dr. Estanislado Pandy.    COVID-19 Education: The signs and symptoms of COVID-19 were discussed with the patient and how to seek care for testing (follow up with PCP or arrange E-visit).  The importance of social distancing was discussed today.  Time:   Today, I have spent 10 minutes with the patient with telehealth technology discussing the above problems.     Medication Adjustments/Labs and Tests Ordered: Current medicines are reviewed at length with the patient today.  Concerns regarding medicines are outlined above.   Tests Ordered: No orders of the defined types were placed in this encounter.   Medication Changes: No orders of the defined types were placed in this encounter.   Follow Up:  Virtual Visit  in 4 month(s)  Signed, Kate Sable, MD  04/12/2019 9:00 AM    [place]

## 2019-04-12 NOTE — Anesthesia Preprocedure Evaluation (Addendum)
Anesthesia Evaluation  Patient identified by MRN, date of birth, ID band Patient awake    Reviewed: Allergy & Precautions, NPO status , Patient's Chart, lab work & pertinent test results, reviewed documented beta blocker date and time   Airway Mallampati: III  TM Distance: >3 FB Neck ROM: Full    Dental  (+) Dental Advisory Given, Upper Dentures, Poor Dentition   Pulmonary Current Smoker and Patient abstained from smoking.,    Pulmonary exam normal breath sounds clear to auscultation       Cardiovascular hypertension, Pt. on home beta blockers and Pt. on medications + CAD, + Past MI, + Cardiac Stents and +CHF  Normal cardiovascular exam+ Valvular Problems/Murmurs MR  Rhythm:Regular Rate:Normal  Echo 03/07/19: IMPRESSIONS 1. Left ventricular ejection fraction, by visual estimation, is 35 to 40%. The left ventricle has moderately decreased function. There is mildly increased left ventricular hypertrophy. 2. Basal and mid inferolateral wall, mid anterolateral segment, and mid inferior segment are abnormal. 3. Elevated left ventricular end-diastolic pressure. 4. Left ventricular diastolic parameters are consistent with Grade II diastolic dysfunction (pseudonormalization). 5. Global right ventricle has normal systolic function.The right ventricular size is normal. No increase in right ventricular wall thickness. 6. Left atrial size was moderately dilated. 7. Right atrial size was normal. 8. Mild mitral annular calcification. 9. The mitral valve is degenerative. Mild mitral valve regurgitation. 10. The tricuspid valve is grossly normal. Tricuspid valve regurgitation is mild. 11. The aortic valve is tricuspid. Aortic valve regurgitation is not visualized. No evidence of aortic valve sclerosis or stenosis. 12. The pulmonic valve was grossly normal. Pulmonic valve regurgitation is not visualized. 13. Normal pulmonary artery systolic  pressure. 14. The inferior vena cava is normal in size with greater than 50% respiratory variability, suggesting right atrial pressure of 3 mmHg. (Comparison 12/20/17: LVEF 40-45%, akinesis basal-midinferolateral myocardium)   Neuro/Psych PSYCHIATRIC DISORDERS Anxiety Brain aneurysm  negative neurological ROS     GI/Hepatic Neg liver ROS, GERD  ,  Endo/Other  negative endocrine ROS  Renal/GU negative Renal ROS     Musculoskeletal negative musculoskeletal ROS (+)   Abdominal   Peds  Hematology  (+) Blood dyscrasia (Plavix), ,   Anesthesia Other Findings Day of surgery medications reviewed with the patient.  Reproductive/Obstetrics                          Anesthesia Physical Anesthesia Plan  ASA: IV  Anesthesia Plan: General   Post-op Pain Management:    Induction: Intravenous  PONV Risk Score and Plan: 2 and Dexamethasone and Ondansetron  Airway Management Planned: Oral ETT  Additional Equipment: Arterial line  Intra-op Plan:   Post-operative Plan: Extubation in OR  Informed Consent: I have reviewed the patients History and Physical, chart, labs and discussed the procedure including the risks, benefits and alternatives for the proposed anesthesia with the patient or authorized representative who has indicated his/her understanding and acceptance.     Dental advisory given  Plan Discussed with: CRNA  Anesthesia Plan Comments: (PAT note written 04/12/2019 by Myra Gianotti, PA-C. )       Anesthesia Quick Evaluation

## 2019-04-12 NOTE — Progress Notes (Signed)
Anesthesia Chart Review:  Case: [id number] Date/Time: 04/15/19 0815   Procedure: [employer] (N/A )   Anesthesia type: General   Pre-op diagnosis: BRAIN ANEURYSM   Location: [place] [facility] [place] [facility]   Surgeon: Luanne Bras, MD      DISCUSSION: Patient is a 58 year old female scheduled for the above procedure.  Recent cerebral angiogram revealed enlarging (L)PCOM aneurysm and stable (R)PCOM aneurysm. Repeat angiogram with planned intervention/pipeline flow diverting stent placement recommended, as well as, smoking cessation.    History includes smoking, HTN, HLD, CAD (NSTEMI, s/p DEX prox-mid CX 03/11/16, DES RCA x2 03/14/16), ischemic cardiomyopathy, chronic combined systolic and diastolic CHF, GERD, thrombocytosis, skin cancer (SCC, nose).  Patient had telemedicine visit with cardiologist Dr. Bronson Ing on 04/12/2019. He wrote,  "She is doing much better from a cardiac standpoint and denies chest pain altogether.  She denies leg swelling, orthopnea and paroxysmal nocturnal dyspnea. She plans to undergo intervention for an enlarging left PCOM aneurysm next week." Extra dose of Lasix prescribed for weight increased. She is on Plavix for upcoming procedure.   04/11/19 pre-procedure COVID-19 test is in process.  She is a same-day work-up to labs per IR scheduled for the day of her procedure.  Anesthesia team evaluation on the day of her procedure.   VS: Ht 5' (1.524 m)   Wt 57.6 kg   BMI 24.80 kg/m   BP Readings from Last 3 Encounters:  04/12/19 (!) 129/96  03/19/19 117/76  02/26/19 130/86    PROVIDERS: [name], MD is PCP  Kate Sable, MD is cardiologist   LABS: She is for updated labs on arrival per IR. As of 03/19/19, H/H 12.3/38.6, PLT 423K, glucose 104, Cr 0.80.    IMAGES: IR ANGIO VERTEBRAL SEL VERTEBRAL BILAT MOD SED; BILATERAL COMMON CAROTID AND INNOMINATE ANGIOGRAPHY 03/19/19: IMPRESSION: - The left posterior communicating artery region outpouching  measuring approximately 3.7 mm x 2.5 mm. Compared to the previous arteriogram of 2018, there has been an increase in the size of this outpouching probably representing an aneurysm with no discrete vessel seen emanating from the fundus of this outpouching. - The right posterior communicating artery region outpouching measuring 3 mm x 2.5 mm is essentially stable compared to the examination of August 2018. - Approximately 60-65% stenosis of the left internal carotid artery at its origin by the NASCET criteria. This appears slightly worse compared to the August 2018 angiogram. - Stable approximately 60-65% stenosis of the proximal right subclavian artery.   EKG: 02/26/19: ST at 105 bpm.   CV: Echo 03/07/19: IMPRESSIONS  1. Left ventricular ejection fraction, by visual estimation, is 35 to 40%. The left ventricle has moderately decreased function. There is mildly increased left ventricular hypertrophy.  2. Basal and mid inferolateral wall, mid anterolateral segment, and mid inferior segment are abnormal.  3. Elevated left ventricular end-diastolic pressure.  4. Left ventricular diastolic parameters are consistent with Grade II diastolic dysfunction (pseudonormalization).  5. Global right ventricle has normal systolic function.The right ventricular size is normal. No increase in right ventricular wall thickness.  6. Left atrial size was moderately dilated.  7. Right atrial size was normal.  8. Mild mitral annular calcification.  9. The mitral valve is degenerative. Mild mitral valve regurgitation. 10. The tricuspid valve is grossly normal. Tricuspid valve regurgitation is mild. 11. The aortic valve is tricuspid. Aortic valve regurgitation is not visualized. No evidence of aortic valve sclerosis or stenosis. 12. The pulmonic valve was grossly normal. Pulmonic valve regurgitation is not  visualized. 13. Normal pulmonary artery systolic pressure. 14. The inferior vena cava is normal in size  with greater than 50% respiratory variability, suggesting right atrial pressure of 3 mmHg. (Comparison 12/20/17: LVEF 40-45%, akinesis basal-midinferolateral myocardium)   Nuclear stress test 03/07/19:  There was no ST segment deviation noted during stress.  Findings consistent with large prior inferior/inferolateral/lateral myocardial infarction with mild peri-infarct ischemia.  This is a high risk study. Risk based primarily on low LVEF and large scar, fairly mild current peri-infarct ischemia.  The left ventricular ejection fraction is severely decreased (<30%). - Results reviewed by Dr. Bronson Ing who wrote, "Evidence for large old MI but only small area of blockage. Manage with meds."   Carotid US 02/18/19: Summary: Right Carotid: Velocities in the right ICA are consistent with a 1-39% stenosis. Left Carotid: Velocities in the left ICA are consistent with a 60-79% stenosis. Vertebrals: Bilateral vertebral arteries demonstrate antegrade flow.   Cardiac event monitor 03/24/17-04/22/17:  Sinus rhythm, sinus tachycardia, and PVCs seen.  Symptoms correlated with all of the above.    Cardiac cath 03/11/16: - Left main: Vessel is large. - Left anterior descending: This was a large.  Vessel is angiographically normal. First diagonal branch: Vessel is small in size. First septal branch: Large branching septal perforator trunk that pretty much gives off all septal perforators. Second diagonal branch: Vessel is small in size. Second septal branch: Vessel small in size. Third diagonal branch: This was small in size. - Left circumflex: 100% mid circumflex lesion. First obtuse marginal branch: Vessel is small in size. Second obtuse marginal branch: Vessel is moderate in size. Third obtuse marginal branch: Vessel is small in size. - Right coronary artery: This was normal in caliber and large.  The vessel exhibits minimal luminal irregularities. Distal RCA lesion located at the  bifurcation is 80% stenosed. Right posterior descending artery: Vessel is angiographically normal.  The vessel is tortuous. Inferior septal: Vessel is small in size. Right posterior arterioventricular artery: Vessel is moderate in size. First right posteolateral branch: Vessel is moderate in size. Second right posteolateral branch: Vessel is small in size. Third right posteolateral branch: Vessel is small in size. PCI 03/11/16: A STENT SYNERGY DES 3X12 drug eluting stent was successfully placed, and overlaps previously placed stent. There is a 0% residual stenosis post intervention. PCI 03/14/16: Successful Stenting of the distal RCA/PDA with DES x 2.   Past Medical History:  Diagnosis Date  . Anxiety state 03/25/2016  . Basal cell carcinoma of forehead   . CHF (congestive heart failure) ([place])   . Coronary artery disease    a. 03/11/16 PCI with DES-->Prox/Mid Cx;  b. 03/14/16 PCI with DES x2-->RCA, EF 30-35%.  . Essential hypertension   . GERD (gastroesophageal reflux disease)   . HFrEF (heart failure with reduced ejection fraction) ([place])    a. 10/2016 Echo: EF 35-40%, Gr1 DD, mild focal basal septal hypertrophy, basal inflat, mid inflat, basal antlat AK. Mid infept/inf/antlat, apical lateral sev HK. Mod MR. mildly reduced RV fxn. Mild TR.  [name] Kitchen History of pneumonia   . Hyperlipidemia   . IBS (irritable bowel syndrome)    diarrhea  . Ischemic cardiomyopathy    a. 10/2016 Echo: EF 35-40%, Gr1 DD.  [name] Kitchen Mitral regurgitation   . NSTEMI (non-ST elevated myocardial infarction) ([place]) 03/10/2016  . Pneumonia 03/2016  . Squamous cell cancer of skin of nose   . Thrombocytosis ([place]) 03/26/2016  . Tobacco abuse   . Trichimoniasis   . Wears dentures   .  Wears glasses     Past Surgical History:  Procedure Laterality Date  . APPENDECTOMY    . BIOPSY  09/20/2018   Procedure: BIOPSY;  Surgeon: Daneil Dolin, MD;  [facility];  Service: Endoscopy;;  colon  . CARDIAC CATHETERIZATION N/A  03/11/2016   Procedure: Left Heart Cath and Coronary Angiography;  Surgeon: Leonie Man, MD;  Location: [place] [facility];  Service: Cardiovascular;  Laterality: N/A;  . CARDIAC CATHETERIZATION N/A 03/11/2016   Procedure: Coronary Stent Intervention;  Surgeon: Leonie Man, MD;  Location: [place] [facility];  Service: Cardiovascular;  Laterality: N/A;  . CARDIAC CATHETERIZATION N/A 03/14/2016   Procedure: Coronary Stent Intervention;  Surgeon: Peter M Martinique, MD;  Location: [place] [facility] LAB;  Service: Cardiovascular;  Laterality: N/A;  . CHOLECYSTECTOMY OPEN  1984  . COLONOSCOPY WITH PROPOFOL N/A 09/20/2018   Procedure: COLONOSCOPY WITH PROPOFOL;  Surgeon: Daneil Dolin, MD;  [facility];  Service: Endoscopy;  Laterality: N/A;  10:30am  . CORONARY ANGIOPLASTY WITH STENT PLACEMENT  03/14/2016  . ESOPHAGOGASTRODUODENOSCOPY (EGD) WITH PROPOFOL N/A 09/20/2018   Procedure: ESOPHAGOGASTRODUODENOSCOPY (EGD) WITH PROPOFOL;  Surgeon: Daneil Dolin, MD;  [facility];  Service: Endoscopy;  Laterality: N/A;  . FINGER ARTHROPLASTY Left 05/14/2013   Procedure: LEFT THUMB CARPAL METACARPAL ARTHROPLASTY;  Surgeon: Tennis Must, MD;  Location: [facility];  Service: Orthopedics;  Laterality: Left;  . IR ANGIO INTRA EXTRACRAN SEL COM CAROTID INNOMINATE BILAT MOD SED  01/05/2017  . IR ANGIO INTRA EXTRACRAN SEL COM CAROTID INNOMINATE BILAT MOD SED  03/19/2019  . IR ANGIO VERTEBRAL SEL VERTEBRAL BILAT MOD SED  01/05/2017  . IR ANGIO VERTEBRAL SEL VERTEBRAL BILAT MOD SED  03/19/2019  . IR RADIOLOGIST EVAL & MGMT  12/30/2016  . IR US GUIDE VASC ACCESS RIGHT  03/19/2019  . MALONEY DILATION N/A 09/20/2018   Procedure: Venia Minks DILATION;  Surgeon: Daneil Dolin, MD;  [facility];  Service: Endoscopy;  Laterality: N/A;  . TUBAL LIGATION  1987  . VAGINAL HYSTERECTOMY  2009    MEDICATIONS: No current facility-administered medications for this encounter.    [name] Kitchen  acetaminophen (TYLENOL) 325 MG tablet  . ALPRAZolam (XANAX) 1 MG tablet  . aspirin EC 81 MG tablet  . calcium carbonate (TUMS - DOSED IN MG ELEMENTAL CALCIUM) 500 MG chewable tablet  . clopidogrel (PLAVIX) 75 MG tablet  . furosemide (LASIX) 20 MG tablet  . gabapentin (NEURONTIN) 100 MG capsule  . loperamide (IMODIUM) 2 MG capsule  . LORazepam (ATIVAN) 1 MG tablet  . metoprolol tartrate (LOPRESSOR) 25 MG tablet  . Multiple Vitamins-Minerals (MULTIVITAMIN WITH MINERALS) tablet  . nicotine (NICODERM CQ - DOSED IN MG/24 HOURS) 21 mg/24hr patch  . nitroGLYCERIN (NITROSTAT) 0.4 MG SL tablet  . ondansetron (ZOFRAN ODT) 4 MG disintegrating tablet  . rOPINIRole (REQUIP) 0.5 MG tablet  . sacubitril-valsartan (ENTRESTO) 24-26 MG  . traMADol (ULTRAM) 50 MG tablet  . atorvastatin (LIPITOR) 80 MG tablet  . ezetimibe (ZETIA) 10 MG tablet  . potassium chloride SA (KLOR-CON) 20 MEQ tablet    Myra Gianotti, PA-C Surgical Short Stay/Anesthesiology [facility] Phone [phone] [facility] Phone [phone] 04/12/2019 12:51 PM

## 2019-04-12 NOTE — Patient Instructions (Addendum)
Medication Instructions:  Your physician recommends that you continue on your current medications as directed. Please refer to the Current Medication list given to you today.  *If you need a refill on your cardiac medications before your next appointment, please call your pharmacy*  Lab Work: None today If you have labs (blood work) drawn today and your tests are completely normal, you will receive your results only by: [name] Kitchen MyChart Message (if you have MyChart) OR . A paper copy in the mail If you have any lab test that is abnormal or we need to change your treatment, we will call you to review the results.  Testing/Procedures: None today  Follow-Up: At [facility], you and your health needs are our priority.  As part of our continuing mission to provide you with exceptional heart care, we have created designated Provider Care Teams.  These Care Teams include your primary Cardiologist (physician) and Advanced Practice Providers (APPs -  Physician Assistants and Nurse Practitioners) who all work together to provide you with the care you need, when you need it.  Your next appointment:   4 month(s)  The format for your next appointment:   Virtual Visit   Provider:   You may see Dr.Koneswaran or one of the following Advanced Practice Providers on your designated Care Team:    Bernerd Pho, PA-C   Ermalinda Barrios, PA-C    Other Instructions None    Thank you for choosing [facility] !

## 2019-04-14 LAB — NOVEL CORONAVIRUS, NAA (HOSP ORDER, SEND-OUT TO REF LAB; TAT 18-24 HRS): SARS-CoV-2, NAA: NOT DETECTED

## 2019-04-15 ENCOUNTER — Ambulatory Visit (HOSPITAL_COMMUNITY): Payer: PRIVATE HEALTH INSURANCE | Admitting: Vascular Surgery

## 2019-04-15 ENCOUNTER — Encounter (HOSPITAL_COMMUNITY): Payer: Self-pay | Admitting: Critical Care Medicine

## 2019-04-15 ENCOUNTER — Other Ambulatory Visit: Payer: Self-pay

## 2019-04-15 ENCOUNTER — Observation Stay (HOSPITAL_COMMUNITY)
Admission: RE | Admit: 2019-04-15 | Discharge: 2019-04-16 | DRG: 038 | Disposition: A | Discharge status: Home / Self Care | Payer: PRIVATE HEALTH INSURANCE | Attending: Interventional Radiology | Admitting: Interventional Radiology

## 2019-04-15 ENCOUNTER — Encounter (HOSPITAL_COMMUNITY)
Admission: RE | Disposition: A | Discharge status: Home / Self Care | Payer: Self-pay | Source: Home / Self Care | Attending: Interventional Radiology

## 2019-04-15 ENCOUNTER — Ambulatory Visit (HOSPITAL_COMMUNITY)
Admission: RE | Admit: 2019-04-15 | Discharge: 2019-04-15 | Disposition: A | Discharge status: Home / Self Care | Payer: PRIVATE HEALTH INSURANCE | Source: Ambulatory Visit | Attending: Interventional Radiology | Admitting: Interventional Radiology

## 2019-04-15 DIAGNOSIS — E785 Hyperlipidemia, unspecified: Secondary | ICD-10-CM | POA: Diagnosis not present

## 2019-04-15 DIAGNOSIS — Z7982 Long term (current) use of aspirin: Secondary | ICD-10-CM

## 2019-04-15 DIAGNOSIS — Z833 Family history of diabetes mellitus: Secondary | ICD-10-CM

## 2019-04-15 DIAGNOSIS — Z8371 Family history of colonic polyps: Secondary | ICD-10-CM | POA: Diagnosis not present

## 2019-04-15 DIAGNOSIS — I6522 Occlusion and stenosis of left carotid artery: Secondary | ICD-10-CM | POA: Diagnosis present

## 2019-04-15 DIAGNOSIS — Z90710 Acquired absence of both cervix and uterus: Secondary | ICD-10-CM | POA: Diagnosis not present

## 2019-04-15 DIAGNOSIS — Z79899 Other long term (current) drug therapy: Secondary | ICD-10-CM | POA: Diagnosis not present

## 2019-04-15 DIAGNOSIS — I671 Cerebral aneurysm, nonruptured: Secondary | ICD-10-CM | POA: Diagnosis not present

## 2019-04-15 DIAGNOSIS — Z823 Family history of stroke: Secondary | ICD-10-CM | POA: Diagnosis not present

## 2019-04-15 DIAGNOSIS — Z79891 Long term (current) use of opiate analgesic: Secondary | ICD-10-CM

## 2019-04-15 DIAGNOSIS — Z9049 Acquired absence of other specified parts of digestive tract: Secondary | ICD-10-CM | POA: Diagnosis not present

## 2019-04-15 DIAGNOSIS — F411 Generalized anxiety disorder: Secondary | ICD-10-CM | POA: Diagnosis present

## 2019-04-15 DIAGNOSIS — Z85828 Personal history of other malignant neoplasm of skin: Secondary | ICD-10-CM

## 2019-04-15 DIAGNOSIS — Z91048 Other nonmedicinal substance allergy status: Secondary | ICD-10-CM

## 2019-04-15 DIAGNOSIS — I252 Old myocardial infarction: Secondary | ICD-10-CM

## 2019-04-15 DIAGNOSIS — Z8249 Family history of ischemic heart disease and other diseases of the circulatory system: Secondary | ICD-10-CM

## 2019-04-15 DIAGNOSIS — I081 Rheumatic disorders of both mitral and tricuspid valves: Secondary | ICD-10-CM | POA: Diagnosis present

## 2019-04-15 DIAGNOSIS — F17210 Nicotine dependence, cigarettes, uncomplicated: Secondary | ICD-10-CM | POA: Diagnosis present

## 2019-04-15 DIAGNOSIS — Z96692 Finger-joint replacement of left hand: Secondary | ICD-10-CM | POA: Diagnosis not present

## 2019-04-15 DIAGNOSIS — I5042 Chronic combined systolic (congestive) and diastolic (congestive) heart failure: Secondary | ICD-10-CM | POA: Diagnosis present

## 2019-04-15 DIAGNOSIS — Z7902 Long term (current) use of antithrombotics/antiplatelets: Secondary | ICD-10-CM

## 2019-04-15 DIAGNOSIS — I110 Hypertensive heart disease with heart failure: Secondary | ICD-10-CM | POA: Diagnosis present

## 2019-04-15 DIAGNOSIS — I255 Ischemic cardiomyopathy: Secondary | ICD-10-CM | POA: Diagnosis present

## 2019-04-15 DIAGNOSIS — I493 Ventricular premature depolarization: Secondary | ICD-10-CM | POA: Diagnosis present

## 2019-04-15 DIAGNOSIS — R251 Tremor, unspecified: Secondary | ICD-10-CM | POA: Diagnosis not present

## 2019-04-15 DIAGNOSIS — K219 Gastro-esophageal reflux disease without esophagitis: Secondary | ICD-10-CM | POA: Diagnosis present

## 2019-04-15 DIAGNOSIS — Z803 Family history of malignant neoplasm of breast: Secondary | ICD-10-CM | POA: Diagnosis not present

## 2019-04-15 DIAGNOSIS — Z716 Tobacco abuse counseling: Secondary | ICD-10-CM

## 2019-04-15 DIAGNOSIS — I2510 Atherosclerotic heart disease of native coronary artery without angina pectoris: Secondary | ICD-10-CM | POA: Diagnosis present

## 2019-04-15 HISTORY — PX: IR TRANSCATH/EMBOLIZ: IMG695

## 2019-04-15 HISTORY — PX: IR ANGIOGRAM FOLLOW UP STUDY: IMG697

## 2019-04-15 HISTORY — PX: IR ANGIO INTRA EXTRACRAN SEL INTERNAL CAROTID UNI L MOD SED: IMG5361

## 2019-04-15 HISTORY — DX: Irritable bowel syndrome, unspecified: K58.9

## 2019-04-15 HISTORY — PX: RADIOLOGY WITH ANESTHESIA: SHX6223

## 2019-04-15 LAB — PROTIME-INR
INR: 0.9 (ref 0.8–1.2)
Prothrombin Time: 11.9 seconds (ref 11.4–15.2)

## 2019-04-15 LAB — URINALYSIS, COMPLETE (UACMP) WITH MICROSCOPIC
Bilirubin Urine: NEGATIVE
Glucose, UA: NEGATIVE mg/dL
Hgb urine dipstick: NEGATIVE
Ketones, ur: NEGATIVE mg/dL
Leukocytes,Ua: NEGATIVE
Nitrite: NEGATIVE
Protein, ur: NEGATIVE mg/dL
Specific Gravity, Urine: 1.005 (ref 1.005–1.030)
pH: 5 (ref 5.0–8.0)

## 2019-04-15 LAB — CBC WITH DIFFERENTIAL/PLATELET
Abs Immature Granulocytes: 0.04 10*3/uL (ref 0.00–0.07)
Basophils Absolute: 0.1 10*3/uL (ref 0.0–0.1)
Basophils Relative: 1 %
Eosinophils Absolute: 0.1 10*3/uL (ref 0.0–0.5)
Eosinophils Relative: 2 %
HCT: 37.3 % (ref 36.0–46.0)
Hemoglobin: 12 g/dL (ref 12.0–15.0)
Immature Granulocytes: 1 %
Lymphocytes Relative: 35 %
Lymphs Abs: 2.4 10*3/uL (ref 0.7–4.0)
MCH: 28 pg (ref 26.0–34.0)
MCHC: 32.2 g/dL (ref 30.0–36.0)
MCV: 86.9 fL (ref 80.0–100.0)
Monocytes Absolute: 0.5 10*3/uL (ref 0.1–1.0)
Monocytes Relative: 7 %
Neutro Abs: 3.7 10*3/uL (ref 1.7–7.7)
Neutrophils Relative %: 54 %
Platelets: 397 10*3/uL (ref 150–400)
RBC: 4.29 MIL/uL (ref 3.87–5.11)
RDW: 15.8 % — ABNORMAL HIGH (ref 11.5–15.5)
WBC: 6.8 10*3/uL (ref 4.0–10.5)
nRBC: 0 % (ref 0.0–0.2)

## 2019-04-15 LAB — POCT ACTIVATED CLOTTING TIME
Activated Clotting Time: 191 seconds
Activated Clotting Time: 197 seconds
Activated Clotting Time: 235 seconds
Activated Clotting Time: 158 seconds
Activated Clotting Time: 197 seconds

## 2019-04-15 LAB — BASIC METABOLIC PANEL
Anion gap: 11 (ref 5–15)
BUN: 13 mg/dL (ref 6–20)
CO2: 23 mmol/L (ref 22–32)
Calcium: 9.4 mg/dL (ref 8.9–10.3)
Chloride: 106 mmol/L (ref 98–111)
Creatinine, Ser: 0.96 mg/dL (ref 0.44–1.00)
GFR calc Af Amer: >60 mL/min (ref >60)
GFR calc non Af Amer: >60 mL/min (ref >60)
Glucose, Bld: 95 mg/dL (ref 70–99)
Potassium: 4 mmol/L (ref 3.5–5.1)
Sodium: 140 mmol/L (ref 135–145)

## 2019-04-15 LAB — APTT: aPTT: 28 seconds (ref 24–36)

## 2019-04-15 LAB — PLATELET INHIBITION P2Y12: Platelet Function  P2Y12: 15 [PRU] — ABNORMAL LOW (ref 182–335)

## 2019-04-15 LAB — MRSA PCR SCREENING: MRSA by PCR: NEGATIVE

## 2019-04-15 LAB — HEPARIN LEVEL (UNFRACTIONATED): Heparin Unfractionated: 0.14 IU/mL — ABNORMAL LOW (ref 0.30–0.70)

## 2019-04-15 SURGERY — IR WITH ANESTHESIA
Anesthesia: General

## 2019-04-15 MED ORDER — NIMODIPINE 30 MG PO CAPS: 0.0000 mg | ORAL_CAPSULE | ORAL | Status: DC

## 2019-04-15 MED ORDER — LIDOCAINE HCL (CARDIAC) PF 100 MG/5ML IV SOSY
PREFILLED_SYRINGE | INTRAVENOUS | Status: DC | PRN
  Administered 2019-04-15: 3 mL via INTRATRACHEAL

## 2019-04-15 MED ORDER — NICOTINE 21 MG/24HR TD PT24
21.0000 mg | MEDICATED_PATCH | Freq: Every day | TRANSDERMAL | Status: DC
  Administered 2019-04-15 – 2019-04-16 (×2): 21 mg via TRANSDERMAL
  Filled 2019-04-15 (×2): qty 1

## 2019-04-15 MED ORDER — ONDANSETRON HCL 4 MG/2ML IJ SOLN
INTRAMUSCULAR | Status: DC | PRN
  Administered 2019-04-15: 4 mg via INTRAVENOUS

## 2019-04-15 MED ORDER — ASPIRIN 81 MG PO CHEW
81.0000 mg | CHEWABLE_TABLET | Freq: Every day | ORAL | Status: DC
  Administered 2019-04-16: 81 mg via ORAL
  Filled 2019-04-15: qty 1

## 2019-04-15 MED ORDER — ACETAMINOPHEN 325 MG PO TABS: 650.0000 mg | ORAL_TABLET | ORAL | Status: DC | PRN

## 2019-04-15 MED ORDER — ONDANSETRON HCL 4 MG/2ML IJ SOLN: 4.0000 mg | Freq: Four times a day (QID) | INTRAMUSCULAR | Status: DC | PRN

## 2019-04-15 MED ORDER — CLEVIDIPINE BUTYRATE 0.5 MG/ML IV EMUL
0.0000 mg/h | INTRAVENOUS | Status: AC
  Administered 2019-04-15: 2 mg/h via INTRAVENOUS
  Filled 2019-04-15: qty 50

## 2019-04-15 MED ORDER — LORAZEPAM 1 MG PO TABS
1.0000 mg | ORAL_TABLET | Freq: Every day | ORAL | Status: DC | PRN
  Administered 2019-04-15: 1 mg via ORAL
  Filled 2019-04-15: qty 1

## 2019-04-15 MED ORDER — PHENYLEPHRINE 40 MCG/ML (10ML) SYRINGE FOR IV PUSH (FOR BLOOD PRESSURE SUPPORT)
PREFILLED_SYRINGE | INTRAVENOUS | Status: DC | PRN
  Administered 2019-04-15: 40 ug via INTRAVENOUS

## 2019-04-15 MED ORDER — SODIUM CHLORIDE 0.9 % IV SOLN: INTRAVENOUS | Status: DC

## 2019-04-15 MED ORDER — GABAPENTIN 100 MG PO CAPS
100.0000 mg | ORAL_CAPSULE | Freq: Three times a day (TID) | ORAL | Status: DC
  Administered 2019-04-15 – 2019-04-16 (×3): 100 mg via ORAL
  Filled 2019-04-15 (×3): qty 1

## 2019-04-15 MED ORDER — ASPIRIN 81 MG PO CHEW: 81.0000 mg | CHEWABLE_TABLET | Freq: Every day | ORAL | Status: DC

## 2019-04-15 MED ORDER — ROCURONIUM BROMIDE 100 MG/10ML IV SOLN
INTRAVENOUS | Status: DC | PRN
  Administered 2019-04-15: 50 mg via INTRAVENOUS

## 2019-04-15 MED ORDER — HEPARIN SODIUM (PORCINE) 1000 UNIT/ML IJ SOLN
INTRAMUSCULAR | Status: DC | PRN
  Administered 2019-04-15: 3000 [IU] via INTRAVENOUS

## 2019-04-15 MED ORDER — METOPROLOL TARTRATE 12.5 MG HALF TABLET: 12.5000 mg | ORAL_TABLET | Freq: Two times a day (BID) | ORAL | Status: DC

## 2019-04-15 MED ORDER — EPTIFIBATIDE 20 MG/10ML IV SOLN
INTRAVENOUS | Status: AC
  Filled 2019-04-15: qty 10

## 2019-04-15 MED ORDER — CLOPIDOGREL BISULFATE 75 MG PO TABS: 75.0000 mg | ORAL_TABLET | ORAL | Status: DC

## 2019-04-15 MED ORDER — ACETAMINOPHEN 650 MG RE SUPP: 650.0000 mg | RECTAL | Status: DC | PRN

## 2019-04-15 MED ORDER — FENTANYL CITRATE (PF) 100 MCG/2ML IJ SOLN: 25.0000 ug | INTRAMUSCULAR | Status: DC | PRN

## 2019-04-15 MED ORDER — ASPIRIN EC 325 MG PO TBEC: 325.0000 mg | DELAYED_RELEASE_TABLET | ORAL | Status: DC

## 2019-04-15 MED ORDER — ALPRAZOLAM 0.5 MG PO TABS
1.0000 mg | ORAL_TABLET | Freq: Every day | ORAL | Status: DC
  Administered 2019-04-15: 1 mg via ORAL
  Filled 2019-04-15: qty 2

## 2019-04-15 MED ORDER — CHLORHEXIDINE GLUCONATE CLOTH 2 % EX PADS: 6.0000 | MEDICATED_PAD | Freq: Every day | CUTANEOUS | Status: DC

## 2019-04-15 MED ORDER — ATORVASTATIN CALCIUM 80 MG PO TABS
80.0000 mg | ORAL_TABLET | Freq: Every day | ORAL | Status: DC
  Administered 2019-04-15: 80 mg via ORAL
  Filled 2019-04-15: qty 1

## 2019-04-15 MED ORDER — EPTIFIBATIDE 20 MG/10ML IV SOLN
INTRAVENOUS | Status: AC | PRN
  Administered 2019-04-15: 3 mg

## 2019-04-15 MED ORDER — HEPARIN (PORCINE) 25000 UT/250ML-% IV SOLN
INTRAVENOUS | Status: AC
  Filled 2019-04-15: qty 250

## 2019-04-15 MED ORDER — LACTATED RINGERS IV SOLN
INTRAVENOUS | Status: DC | PRN
  Administered 2019-04-15: 08:00:00 via INTRAVENOUS

## 2019-04-15 MED ORDER — EZETIMIBE 10 MG PO TABS
10.0000 mg | ORAL_TABLET | Freq: Every day | ORAL | Status: DC
  Administered 2019-04-15 – 2019-04-16 (×2): 10 mg via ORAL
  Filled 2019-04-15 (×2): qty 1

## 2019-04-15 MED ORDER — FENTANYL CITRATE (PF) 100 MCG/2ML IJ SOLN
INTRAMUSCULAR | Status: DC | PRN
  Administered 2019-04-15 (×2): 50 ug via INTRAVENOUS

## 2019-04-15 MED ORDER — POTASSIUM CHLORIDE CRYS ER 20 MEQ PO TBCR
20.0000 meq | EXTENDED_RELEASE_TABLET | Freq: Every day | ORAL | Status: DC
  Administered 2019-04-16: 20 meq via ORAL
  Filled 2019-04-15: qty 1

## 2019-04-15 MED ORDER — SUGAMMADEX SODIUM 200 MG/2ML IV SOLN
INTRAVENOUS | Status: DC | PRN
  Administered 2019-04-15: 120 mg via INTRAVENOUS

## 2019-04-15 MED ORDER — SODIUM CHLORIDE 0.9 % IV SOLN
INTRAVENOUS | Status: DC
  Administered 2019-04-15 – 2019-04-16 (×2): via INTRAVENOUS

## 2019-04-15 MED ORDER — ROPINIROLE HCL 0.5 MG PO TABS
0.5000 mg | ORAL_TABLET | Freq: Every day | ORAL | Status: DC
  Administered 2019-04-15: 0.5 mg via ORAL
  Filled 2019-04-15 (×3): qty 1

## 2019-04-15 MED ORDER — CEFAZOLIN SODIUM-DEXTROSE 2-4 GM/100ML-% IV SOLN
2.0000 g | INTRAVENOUS | Status: AC
  Administered 2019-04-15: 2 g via INTRAVENOUS
  Filled 2019-04-15: qty 100

## 2019-04-15 MED ORDER — HEPARIN (PORCINE) 25000 UT/250ML-% IV SOLN
500.0000 [IU]/h | INTRAVENOUS | Status: DC
  Administered 2019-04-15: 500 [IU]/h via INTRAVENOUS

## 2019-04-15 MED ORDER — DEXAMETHASONE SODIUM PHOSPHATE 10 MG/ML IJ SOLN
INTRAMUSCULAR | Status: DC | PRN
  Administered 2019-04-15: 4 mg via INTRAVENOUS

## 2019-04-15 MED ORDER — CLOPIDOGREL BISULFATE 75 MG PO TABS: 75.0000 mg | ORAL_TABLET | Freq: Every day | ORAL | Status: DC

## 2019-04-15 MED ORDER — ACETAMINOPHEN 325 MG PO TABS: 325.0000 mg | ORAL_TABLET | Freq: Four times a day (QID) | ORAL | Status: DC | PRN

## 2019-04-15 MED ORDER — SACUBITRIL-VALSARTAN 24-26 MG PO TABS
1.0000 | ORAL_TABLET | Freq: Two times a day (BID) | ORAL | Status: DC
  Administered 2019-04-16: 1 via ORAL
  Filled 2019-04-15 (×2): qty 1

## 2019-04-15 MED ORDER — PHENYLEPHRINE HCL-NACL 10-0.9 MG/250ML-% IV SOLN
INTRAVENOUS | Status: DC | PRN
  Administered 2019-04-15: 5 ug/min via INTRAVENOUS

## 2019-04-15 MED ORDER — IOHEXOL 300 MG/ML  SOLN
150.0000 mL | Freq: Once | INTRAMUSCULAR | Status: AC | PRN
  Administered 2019-04-15: 30 mL via INTRA_ARTERIAL

## 2019-04-15 MED ORDER — PROPOFOL 10 MG/ML IV BOLUS
INTRAVENOUS | Status: DC | PRN
  Administered 2019-04-15: 30 mg via INTRAVENOUS
  Administered 2019-04-15 (×2): 50 mg via INTRAVENOUS

## 2019-04-15 MED ORDER — TRAMADOL HCL 50 MG PO TABS
50.0000 mg | ORAL_TABLET | Freq: Four times a day (QID) | ORAL | Status: DC | PRN
  Administered 2019-04-15: 50 mg via ORAL
  Filled 2019-04-15: qty 1

## 2019-04-15 MED ORDER — FUROSEMIDE 20 MG PO TABS
20.0000 mg | ORAL_TABLET | Freq: Every day | ORAL | Status: DC
  Administered 2019-04-16: 20 mg via ORAL
  Filled 2019-04-15: qty 1

## 2019-04-15 MED ORDER — METOPROLOL TARTRATE 12.5 MG HALF TABLET
12.5000 mg | ORAL_TABLET | Freq: Two times a day (BID) | ORAL | Status: DC
  Administered 2019-04-15 – 2019-04-16 (×2): 12.5 mg via ORAL
  Filled 2019-04-15 (×4): qty 1

## 2019-04-15 MED ORDER — CLOPIDOGREL BISULFATE 75 MG PO TABS
75.0000 mg | ORAL_TABLET | Freq: Every day | ORAL | Status: DC
  Administered 2019-04-16: 75 mg via ORAL
  Filled 2019-04-15: qty 1

## 2019-04-15 MED ORDER — NITROGLYCERIN 1 MG/10 ML FOR IR/CATH LAB
INTRA_ARTERIAL | Status: AC
  Filled 2019-04-15: qty 10

## 2019-04-15 MED ORDER — ALPRAZOLAM 1 MG PO TABS: 1.0000 mg | ORAL_TABLET | Freq: Every day | ORAL | Status: DC

## 2019-04-15 MED ORDER — ONDANSETRON HCL 4 MG/2ML IJ SOLN: 4.0000 mg | Freq: Once | INTRAMUSCULAR | Status: DC | PRN

## 2019-04-15 MED ORDER — ACETAMINOPHEN 160 MG/5ML PO SOLN: 650.0000 mg | ORAL | Status: DC | PRN

## 2019-04-15 MED ORDER — PROTAMINE SULFATE 10 MG/ML IV SOLN
INTRAVENOUS | Status: DC | PRN
  Administered 2019-04-15: 2.5 mg via INTRAVENOUS
  Administered 2019-04-15: 5 mg via INTRAVENOUS
  Administered 2019-04-15: 2.5 mg via INTRAVENOUS

## 2019-04-15 MED ORDER — LOPERAMIDE HCL 2 MG PO CAPS: 2.0000 mg | ORAL_CAPSULE | Freq: Four times a day (QID) | ORAL | Status: DC | PRN

## 2019-04-15 MED ORDER — NITROGLYCERIN 0.4 MG SL SUBL: 0.4000 mg | SUBLINGUAL_TABLET | SUBLINGUAL | Status: DC | PRN

## 2019-04-15 MED ORDER — LACTATED RINGERS IV SOLN
INTRAVENOUS | Status: DC | PRN
  Administered 2019-04-15: 09:00:00 via INTRAVENOUS

## 2019-04-15 MED ORDER — LABETALOL HCL 5 MG/ML IV SOLN
INTRAVENOUS | Status: DC | PRN
  Administered 2019-04-15 (×4): 2.5 mg via INTRAVENOUS

## 2019-04-15 MED ORDER — IOHEXOL 300 MG/ML  SOLN
150.0000 mL | Freq: Once | INTRAMUSCULAR | Status: AC | PRN
  Administered 2019-04-15: 50 mL via INTRA_ARTERIAL

## 2019-04-15 NOTE — Procedures (Signed)
S/P Lt common carotid arteriogram followe dby embolization of LT ICA PCOM aneurysm with flow diverter. S.Lundon Rosier MD

## 2019-04-15 NOTE — Progress Notes (Signed)
ANTICOAGULATION CONSULT NOTE - Initial Consult  Pharmacy Consult:  Heparin Indication:  ICA embolization  Allergies  Allergen Reactions  . Tape Other (See Comments)    PEELS SKIN OFF  (PAPER TAPE IS FINE)    Patient Measurements: Height: 5' (152.4 cm) Weight: 132 lb 7.9 oz (60.1 kg) IBW/kg (Calculated) : 45.5 Heparin Dosing Weight: 60 kg  Vital Signs: Temp: 98.9 F (37.2 C) (12/07 1310) Temp Source: Oral (12/07 1310) BP: 100/69 (12/07 1310) Pulse Rate: 87 (12/07 1310)  Labs: Recent Labs    04/15/19 0715 04/15/19 0730  HGB 12.0  --   HCT 37.3  --   PLT 397  --   APTT 28  --   LABPROT  --  11.9  INR  --  0.9  CREATININE 0.96  --     Estimated Creatinine Clearance: 53 mL/min (by C-G formula based on SCr of 0.96 mg/dL).   Medical History: Past Medical History:  Diagnosis Date  . Anxiety state 03/25/2016  . Basal cell carcinoma of forehead   . CHF (congestive heart failure) ([place])   . Coronary artery disease    a. 03/11/16 PCI with DES-->Prox/Mid Cx;  b. 03/14/16 PCI with DES x2-->RCA, EF 30-35%.  . Essential hypertension   . GERD (gastroesophageal reflux disease)   . HFrEF (heart failure with reduced ejection fraction) ([place])    a. 10/2016 Echo: EF 35-40%, Gr1 DD, mild focal basal septal hypertrophy, basal inflat, mid inflat, basal antlat AK. Mid infept/inf/antlat, apical lateral sev HK. Mod MR. mildly reduced RV fxn. Mild TR.  [name] Kitchen History of pneumonia   . Hyperlipidemia   . IBS (irritable bowel syndrome)    diarrhea  . Ischemic cardiomyopathy    a. 10/2016 Echo: EF 35-40%, Gr1 DD.  [name] Kitchen Mitral regurgitation   . NSTEMI (non-ST elevated myocardial infarction) ([place]) 03/10/2016  . Pneumonia 03/2016  . Squamous cell cancer of skin of nose   . Thrombocytosis ([place]) 03/26/2016  . Tobacco abuse   . Trichimoniasis   . Wears dentures   . Wears glasses     Assessment: 47 YOF presented for left common carotid arteriogram and embolization.  Patient was started on IV heparin  in the PACU.  No bleeding reported.  Goal of Therapy:  Heparin level 0.1-0.25 units/ml Monitor platelets by anticoagulation protocol: Yes   Plan:  Continue heparin gtt at 500 units/hr, off 12/8 at 0800 per protocl Check 6 hr heparin level  Joyceline Maiorino D. Mina Marble, PharmD, BCPS, [place] 04/15/2019, 1:26 PM

## 2019-04-15 NOTE — H&P (Deleted)
  The note originally documented on this encounter has been moved the the encounter in which it belongs.  

## 2019-04-15 NOTE — Sedation Documentation (Signed)
Patient transported to PACU 5. Bedside report given to [facility]. Pulse checked, groin site explained with instructions to quick clot pad given and orders reviewed.

## 2019-04-15 NOTE — Transfer of Care (Signed)
Immediate Anesthesia Transfer of Care Note  Patient: [name]  Procedure(s) Performed: [employer] (N/A )  Patient Location: PACU  Anesthesia Type:General  Level of Consciousness: awake, alert  and oriented  Airway & Oxygen Therapy: Patient Spontanous Breathing and Patient connected to nasal cannula oxygen  Post-op Assessment: Report given to RN, Post -op Vital signs reviewed and stable and Patient moving all extremities  Post vital signs: Reviewed and stable  Last Vitals:  Vitals Value Taken Time  BP 113/74 04/15/19 1220  Temp    Pulse 87 04/15/19 1223  Resp 22 04/15/19 1223  SpO2 100 % 04/15/19 1223  Vitals shown include unvalidated device data.  Last Pain:  Vitals:   04/15/19 0742  PainSc: 0-No pain      Patients Stated Pain Goal: 1 ([id number])  Complications: No apparent anesthesia complications

## 2019-04-15 NOTE — Progress Notes (Signed)
NIR.  Patient underwent an image-guided cerebral arteriogram with embolization of left PCOM aneurysm using a pipeline flex flow diverter 04/15/2019 by Dr. Estanislado Pandy.  Evaluated patient alongside Dr. Estanislado Pandy in neuro ICU following procedure. Patient awake and alert laying in bed with no complaints at this time. Accompanied by husband at bedside. Denies headache, weakness, numbness/tingling, dizziness, vision changes, hearing changes, tinnitus, or speech difficulty.  Alert, awake, and oriented x3. Speech and comprehension intact. PERRL bilaterally. No facial asymmetry. Tongue midline. Motor power symmetric proportional to effort. No pronator drift. Distal pulses palpable bilaterally with Doppler. Right groin incision soft without active bleeding or hematoma.  Plan to stay in neuro ICU for overnight observation. BP goal = 0000000 systolic. Advance diet as tolerated. NIR to follow.   Erin Graff Ishita Mcnerney, PA-C 04/15/2019, 3:45 PM

## 2019-04-15 NOTE — Progress Notes (Signed)
ANTICOAGULATION CONSULT NOTE Follow Up Consult  Pharmacy Consult:  Heparin Indication:  ICA embolization  Allergies  Allergen Reactions  . Tape Other (See Comments)    PEELS SKIN OFF  (PAPER TAPE IS FINE)    Patient Measurements: Height: 5' (152.4 cm) Weight: 132 lb 7.9 oz (60.1 kg) IBW/kg (Calculated) : 45.5 Heparin Dosing Weight: 60 kg  Vital Signs: Temp: 98.8 F (37.1 C) (12/07 2000) Temp Source: Oral (12/07 2000) BP: 88/58 (12/07 1900) Pulse Rate: 97 (12/07 1900)  Labs: Recent Labs    04/15/19 0715 04/15/19 0730 04/15/19 2012  HGB 12.0  --   --   HCT 37.3  --   --   PLT 397  --   --   APTT 28  --   --   LABPROT  --  11.9  --   INR  --  0.9  --   HEPARINUNFRC  --   --  0.14*  CREATININE 0.96  --   --     Estimated Creatinine Clearance: 53 mL/min (by C-G formula based on SCr of 0.96 mg/dL).   Medical History: Past Medical History:  Diagnosis Date  . Anxiety state 03/25/2016  . Basal cell carcinoma of forehead   . CHF (congestive heart failure) ([place])   . Coronary artery disease    a. 03/11/16 PCI with DES-->Prox/Mid Cx;  b. 03/14/16 PCI with DES x2-->RCA, EF 30-35%.  . Essential hypertension   . GERD (gastroesophageal reflux disease)   . HFrEF (heart failure with reduced ejection fraction) ([place])    a. 10/2016 Echo: EF 35-40%, Gr1 DD, mild focal basal septal hypertrophy, basal inflat, mid inflat, basal antlat AK. Mid infept/inf/antlat, apical lateral sev HK. Mod MR. mildly reduced RV fxn. Mild TR.  [name] Kitchen History of pneumonia   . Hyperlipidemia   . IBS (irritable bowel syndrome)    diarrhea  . Ischemic cardiomyopathy    a. 10/2016 Echo: EF 35-40%, Gr1 DD.  [name] Kitchen Mitral regurgitation   . NSTEMI (non-ST elevated myocardial infarction) ([place]) 03/10/2016  . Pneumonia 03/2016  . Squamous cell cancer of skin of nose   . Thrombocytosis ([place]) 03/26/2016  . Tobacco abuse   . Trichimoniasis   . Wears dentures   . Wears glasses     Assessment: 89 YOF presented for left  common carotid arteriogram and embolization.  Patient was started on IV heparin in the PACU.  No bleeding reported. Heparin drip 500 uts/hr HL 0.14 at goal   Goal of Therapy:  Heparin level 0.1-0.25 units/ml Monitor platelets by anticoagulation protocol: Yes   Plan:  Continue heparin gtt at 500 units/hr, off 12/8 at 0800 per protocol   Bonnita Nasuti Pharm.D. CPP, BCPS Clinical Pharmacist [phone] 04/15/2019 8:46 PM

## 2019-04-15 NOTE — Anesthesia Procedure Notes (Signed)
Procedure Name: Intubation Date/Time: 04/15/2019 8:57 AM Performed by: Wilburn Cornelia, CRNA Pre-anesthesia Checklist: Patient identified, Emergency Drugs available, Suction available, Patient being monitored and Timeout performed Patient Re-evaluated:Patient Re-evaluated prior to induction Oxygen Delivery Method: Circle system utilized Preoxygenation: Pre-oxygenation with 100% oxygen Induction Type: IV induction Ventilation: Mask ventilation without difficulty Laryngoscope Size: Mac and 3 Grade View: Grade I Tube type: Oral Tube size: 7.0 mm Number of attempts: 1 Airway Equipment and Method: Stylet Placement Confirmation: ETT inserted through vocal cords under direct vision,  positive ETCO2,  CO2 detector and breath sounds checked- equal and bilateral Secured at: 21 cm Tube secured with: Tape Dental Injury: Teeth and Oropharynx as per pre-operative assessment

## 2019-04-15 NOTE — H&P (Addendum)
Chief Complaint: Patient was seen in consultation today for left PCOM aneurysm/embolization.  Referring Physician(s): Alda Berthold  Supervising Physician: Luanne Bras  Patient Status: [facility] - Out-pt  History of Present Illness: Erin [name] is a 58 y.o. female with a past medical history of hyperlipidemia, CAD, HF, NSTEMI 03/2016, mitral regurgitation, ischemic cardiomyopathy, pneumonia, IBS, thrombocytosis, anxiety, and tobacco abuse. She is known to [facility] and has been followed by Dr. Estanislado Pandy since 12/2016. She first presented to our department at the request of Dr. Posey Pronto for management of a left PCOM aneurysm. At that time, patient decided to pursue conservative management for her aneurysm, including routine imaging scans/cererbal arteriograms to monitor for changes. Her most recent image-guided diagnostic cerebral arteriogram on 03/19/2019 by Dr. Estanislado Pandy revealed a stable right PCOM aneurysm and a left PCOM aneurysm that has increased in size compared to prior studies. She consulted with Dr. Estanislado Pandy 03/26/2019 to discuss management options. At that time, patient decided to pursue endovascular embolization of left PCOM aneurysm.  Diagnostic cerebral arteriogram 03/19/2019: 1. The left posterior communicating artery region outpouching measuring approximately 3.7 mm x 2.5 mm. Compared to the previous arteriogram of 2018, there has been an increase in the size of this outpouching probably representing an aneurysm with no discrete vessel seen emanating from the fundus of this outpouching. 2. The right posterior communicating artery region outpouching measuring 3 mm x 2.5 mm is essentially stable compared to the examination of August 2018. 3. Approximately 60-65% stenosis of the left internal carotid artery at its origin by the NASCET criteria. This appears slightly worse compared to the August 2018 angiogram. 4. Stable approximately 60-65% stenosis of the proximal right subclavian  artery.  Patient presents today for possible image-guided cerebral arteriogram with possible embolization of left PCOM aneurysm. Patient awake and alert laying in bed with no complaints at this time. Denies fever, chills, chest pain, dyspnea, abdominal pain, headache, or vision changes.  Currently taking Plavix 75 mg once daily and Aspirin 81 mg once daily.   Past Medical History:  Diagnosis Date  . Anxiety state 03/25/2016  . Basal cell carcinoma of forehead   . CHF (congestive heart failure) ([place])   . Coronary artery disease    a. 03/11/16 PCI with DES-->Prox/Mid Cx;  b. 03/14/16 PCI with DES x2-->RCA, EF 30-35%.  . Essential hypertension   . GERD (gastroesophageal reflux disease)   . HFrEF (heart failure with reduced ejection fraction) ([place])    a. 10/2016 Echo: EF 35-40%, Gr1 DD, mild focal basal septal hypertrophy, basal inflat, mid inflat, basal antlat AK. Mid infept/inf/antlat, apical lateral sev HK. Mod MR. mildly reduced RV fxn. Mild TR.  [name] Kitchen History of pneumonia   . Hyperlipidemia   . IBS (irritable bowel syndrome)    diarrhea  . Ischemic cardiomyopathy    a. 10/2016 Echo: EF 35-40%, Gr1 DD.  [name] Kitchen Mitral regurgitation   . NSTEMI (non-ST elevated myocardial infarction) ([place]) 03/10/2016  . Pneumonia 03/2016  . Squamous cell cancer of skin of nose   . Thrombocytosis ([place]) 03/26/2016  . Tobacco abuse   . Trichimoniasis   . Wears dentures   . Wears glasses     Past Surgical History:  Procedure Laterality Date  . APPENDECTOMY    . BIOPSY  09/20/2018   Procedure: BIOPSY;  Surgeon: Daneil Dolin, MD;  Location[facility];  Service: Endoscopy;;  colon  . CARDIAC CATHETERIZATION N/A 03/11/2016   Procedure: Left Heart Cath and Coronary Angiography;  Surgeon: Shanon Brow  Loren Racer, MD;  Location: [place] [facility] LAB;  Service: Cardiovascular;  Laterality: N/A;  . CARDIAC CATHETERIZATION N/A 03/11/2016   Procedure: Coronary Stent Intervention;  Surgeon: Leonie Man, MD;  Location: [place] [facility];  Service: Cardiovascular;  Laterality: N/A;  . CARDIAC CATHETERIZATION N/A 03/14/2016   Procedure: Coronary Stent Intervention;  Surgeon: Peter M Martinique, MD;  Location: [place] [facility] LAB;  Service: Cardiovascular;  Laterality: N/A;  . CHOLECYSTECTOMY OPEN  1984  . COLONOSCOPY WITH PROPOFOL N/A 09/20/2018   Procedure: COLONOSCOPY WITH PROPOFOL;  Surgeon: Daneil Dolin, MD;  [facility];  Service: Endoscopy;  Laterality: N/A;  10:30am  . CORONARY ANGIOPLASTY WITH STENT PLACEMENT  03/14/2016  . ESOPHAGOGASTRODUODENOSCOPY (EGD) WITH PROPOFOL N/A 09/20/2018   Procedure: ESOPHAGOGASTRODUODENOSCOPY (EGD) WITH PROPOFOL;  Surgeon: Daneil Dolin, MD;  [facility];  Service: Endoscopy;  Laterality: N/A;  . FINGER ARTHROPLASTY Left 05/14/2013   Procedure: LEFT THUMB CARPAL METACARPAL ARTHROPLASTY;  Surgeon: Tennis Must, MD;  Location: [place];  Service: Orthopedics;  Laterality: Left;  . IR ANGIO INTRA EXTRACRAN SEL COM CAROTID INNOMINATE BILAT MOD SED  01/05/2017  . IR ANGIO INTRA EXTRACRAN SEL COM CAROTID INNOMINATE BILAT MOD SED  03/19/2019  . IR ANGIO VERTEBRAL SEL VERTEBRAL BILAT MOD SED  01/05/2017  . IR ANGIO VERTEBRAL SEL VERTEBRAL BILAT MOD SED  03/19/2019  . IR RADIOLOGIST EVAL & MGMT  12/30/2016  . IR US GUIDE VASC ACCESS RIGHT  03/19/2019  . MALONEY DILATION N/A 09/20/2018   Procedure: Venia Minks DILATION;  Surgeon: Daneil Dolin, MD;  [facility];  Service: Endoscopy;  Laterality: N/A;  . TUBAL LIGATION  1987  . VAGINAL HYSTERECTOMY  2009    Allergies: Tape  Medications: Prior to Admission medications   Medication Sig Start Date End Date Taking? Authorizing Provider  acetaminophen (TYLENOL) 325 MG tablet Take 325 mg by mouth every 6 (six) hours as needed for mild pain or headache.     [provider]  ALPRAZolam Duanne Moron) 1 MG tablet Take 1 mg by mouth at bedtime.     [provider]  aspirin EC 81 MG  tablet Take 81 mg by mouth every evening.     [provider]  atorvastatin (LIPITOR) 80 MG tablet TAKE 1 TABLET BY MOUTH ONCE DAILY AT  6  IN  THE  EVENING 04/10/19   Herminio Commons, MD  calcium carbonate (TUMS - DOSED IN MG ELEMENTAL CALCIUM) 500 MG chewable tablet Chew 1 tablet by mouth 3 times/day as needed-between meals & bedtime for indigestion or heartburn.    [provider]  clopidogrel (PLAVIX) 75 MG tablet Take 75 mg by mouth daily.    [provider]  ezetimibe (ZETIA) 10 MG tablet Take 1 tablet by mouth once daily 04/10/19   Herminio Commons, MD  furosemide (LASIX) 20 MG tablet Take 1 tablet by mouth once daily 03/26/19   Herminio Commons, MD  gabapentin (NEURONTIN) 100 MG capsule Take 100 mg by mouth 3 (three) times daily.    [provider]  loperamide (IMODIUM) 2 MG capsule Take 2 mg by mouth 4 (four) times daily as needed for diarrhea or loose stools (ibs).    [provider]  LORazepam (ATIVAN) 1 MG tablet Take 1 mg by mouth daily as needed for anxiety.  11/09/17   [provider]  metoprolol tartrate (LOPRESSOR) 25 MG tablet Take 0.5 tablets (12.5 mg  total) by mouth 2 (two) times daily. 02/22/19 05/23/19  Herminio Commons, MD  Multiple Vitamins-Minerals (MULTIVITAMIN WITH MINERALS) tablet Take 1 tablet by mouth daily.    [provider]  nicotine (NICODERM CQ - DOSED IN MG/24 HOURS) 21 mg/24hr patch Place 21 mg onto the skin daily.    [provider]  nitroGLYCERIN (NITROSTAT) 0.4 MG SL tablet Place 1 tablet (0.4 mg total) under the tongue every 5 (five) minutes x 3 doses as needed for chest pain. 03/05/18   Manuella Ghazi, Pratik D, DO  ondansetron (ZOFRAN ODT) 4 MG disintegrating tablet Take 1 tablet (4 mg total) by mouth every 8 (eight) hours as needed. 10/23/18   Fredia Sorrow, MD  potassium chloride SA (KLOR-CON) 20 MEQ tablet Take 1 tablet by mouth once daily 04/10/19   Herminio Commons, MD   rOPINIRole (REQUIP) 0.5 MG tablet Take 0.5 mg by mouth at bedtime.  11/13/17   [provider]  sacubitril-valsartan (ENTRESTO) 24-26 MG Take 1 tablet by mouth 2 (two) times daily. 02/22/19   Herminio Commons, MD  traMADol (ULTRAM) 50 MG tablet Take 1 tablet (50 mg total) by mouth every 6 (six) hours as needed. Patient taking differently: Take 50 mg by mouth every 6 (six) hours as needed (pain).  12/22/18   Evalee Jefferson, PA-C     Family History  Problem Relation Age of Onset  . Diabetes Father   . Hypertension Father   . CAD Father   . Colon polyps Father 60       pre-cancerous   . Stroke Father   . Dementia Father   . Stroke Mother   . Hypertension Mother   . Diabetes Mother   . Heart failure Other   . Breast cancer Maternal Grandmother   . Colon cancer Neg Hx     Social History   Socioeconomic History  . Marital status: Married    Spouse name: Not on file  . Number of children: Not on file  . Years of education: Not on file  . Highest education level: Not on file  Occupational History  . Occupation: CNA  Social Needs  . Financial resource strain: Not on file  . Food insecurity    Worry: Not on file    Inability: Not on file  . Transportation needs    Medical: Not on file    Non-medical: Not on file  Tobacco Use  . Smoking status: Current Some Day Smoker    Packs/day: 0.10    Years: 15.00    Pack years: 1.50    Types: Cigarettes  . Smokeless tobacco: Never Used  . Tobacco comment: smokes a cigarette occasionally  Substance and Sexual Activity  . Alcohol use: Not Currently    Comment: occasionally  . Drug use: Not Currently    Types: Marijuana    Comment: former- 2017 last time  . Sexual activity: Not Currently    Birth control/protection: Surgical    Comment: hyst  Lifestyle  . Physical activity    Days per week: Not on file    Minutes per session: Not on file  . Stress: Not on file  Relationships  . Social Herbalist on phone:  Not on file    Gets together: Not on file    Attends religious service: Not on file    Active member of club or organization: Not on file    Attends meetings of clubs or organizations: Not on file  Relationship status: Not on file  Other Topics Concern  . Not on file  Social History Narrative   Lives with husband in [place] in a one story home with a basement.  Has 4 children.  Works as a Quarry manager.  Education: CNA school.      Review of Systems: A 12 point ROS discussed and pertinent positives are indicated in the HPI above.  All other systems are negative.  Review of Systems  Constitutional: Negative for chills and fever.  Eyes: Negative for visual disturbance.  Respiratory: Negative for shortness of breath and wheezing.   Cardiovascular: Negative for chest pain and palpitations.  Gastrointestinal: Negative for abdominal pain.  Neurological: Negative for headaches.  Psychiatric/Behavioral: Negative for behavioral problems and confusion.    Vital Signs: BP 106/69   Pulse 82   Temp 97.9 F (36.6 C) (Oral)   Resp 18   Ht 5' (1.524 m)   SpO2 100%   BMI 24.80 kg/m   Physical Exam Vitals signs and nursing note reviewed.  Constitutional:      General: She is not in acute distress.    Appearance: Normal appearance.  Cardiovascular:     Rate and Rhythm: Normal rate and regular rhythm.     Heart sounds: Normal heart sounds. No murmur.  Pulmonary:     Effort: Pulmonary effort is normal. No respiratory distress.     Breath sounds: Normal breath sounds. No wheezing.  Skin:    General: Skin is warm and dry.  Neurological:     Mental Status: She is alert.     Comments: Alert, awake, and oriented x3. Speech and comprehension intact. No facial asymmetry. Tongue midline. Motor power symmetric proportional to effort. Fine motor and coordination intact and symmetric.  Psychiatric:        Mood and Affect: Mood normal.        Behavior: Behavior normal.      MD  Evaluation Airway: WNL Heart: WNL Abdomen: WNL Chest/ Lungs: WNL ASA  Classification: 3 Mallampati/Airway Score: Two   Imaging: Ir US Guide Vasc Access Right  Result Date: 03/20/2019 CLINICAL DATA:  History of worsening headaches over the past few months. The headaches almost constant generalized and pounding. Previous history of bilateral posterior communicating artery region outpouchings. EXAM: IR ANGIO VERTEBRAL SEL VERTEBRAL BILAT MOD SED; BILATERAL COMMON CAROTID AND INNOMINATE ANGIOGRAPHY; IR ULTRASOUND GUIDANCE VASC ACCESS RIGHT COMPARISON:  Diagnostic catheter arteriogram of January 05, 2017. MEDICATIONS: Heparin 1000 units IV. No antibiotic was administered within 1 hour of the procedure. ANESTHESIA/SEDATION: Versed 1.5 mg IV; Fentanyl 37.5 mcg IV Moderate Sedation Time:  68 minutes The patient was continuously monitored during the procedure by the interventional radiology nurse under my direct supervision. CONTRAST:  Isovue-300 approximately 60 mL. FLUOROSCOPY TIME:  Fluoroscopy Time: 9 minutes 18 seconds (621 mGy). COMPLICATIONS: None immediate. TECHNIQUE: Informed written consent was obtained from the patient after a thorough discussion of the procedural risks, benefits and alternatives. All questions were addressed. Maximal Sterile Barrier Technique was utilized including caps, mask, sterile gowns, sterile gloves, sterile drape, hand hygiene and skin antiseptic. A timeout was performed prior to the initiation of the procedure. The right radial artery initially was evaluated with palpation and ultrasound. A dorsal palmar anastomosis was verified. Documentation of the right radial artery was obtained with ultrasound. Access was obtained into the right radial artery with a micropuncture set and exchanged over a 0.018 inch micro wire for a 4/5 French radial sheath. However, there was no back bleed  obtained from the side port of the sheath. An ultrasound evaluation demonstrated severe spasm. This  route was then abandoned. Hemostasis was achieved at the right radial puncture site with a pressure dressing. The right groin was prepped and draped in the usual sterile fashion. Thereafter using modified Seldinger technique, transfemoral access into the right common femoral artery was obtained without difficulty. Over a 0.035 inch guidewire, a 5 French Pinnacle sheath was inserted. Through this, and also over 0.035 inch guidewire, a 5 [place] JB 1 catheter was advanced to the aortic arch region and selectively positioned in the right common carotid artery, the right vertebral artery, the left common carotid artery and the left vertebral artery. FINDINGS: The innominate artery arteriogram demonstrates approximately 60%-65% stenosis of the proximal right subclavian artery. The right vertebral artery origin is widely patent. The vessel is seen to opacify to the cranial skull base. Wide patency is seen of the right posterior-inferior cerebellar artery and the right vertebrobasilar junction. The opacified portion of the basilar artery, the posterior cerebral arteries, the superior cerebellar arteries and the anterior-inferior cerebellar arteries is seen into the capillary and venous phases. Unopacified blood is seen in the basilar artery from the contralateral vertebral artery. The right common carotid arteriogram demonstrates severe narrowing at the origin of the right external carotid artery. Its branches, however, opacify. The right internal carotid artery at the bulb has a shallow shelf-like plaque with less than 10% stenosis by the NASCET criteria. No evidence of acute ulcerations are seen. More distally the vessel is seen to opacify to the cranial skull base. The petrous segment is widely patent. Mild atherosclerotic narrowing is seen of the caval cavernous segment. The supraclinoid segment is widely patent. The right middle cerebral artery and the right anterior cerebral artery opacify into the capillary and  venous phases. Arising again in the right posterior communicating artery region is a saccular outpouching with a vessel emanating at its apex. This measures approximately 3 mm x 2.5 mm. The right vertebral artery origin is widely patent. There is mild tortuosity just distal to its origin. More distally the left vertebral artery is seen to opacify to the cranial skull base. Wide patency is seen of the left posterior-inferior cerebellar artery and the left vertebrobasilar junction. The basilar artery, the posterior cerebral arteries, the superior cerebellar arteries and the anterior-inferior cerebellar arteries opacify into the capillary and venous phases. There is transverse retrograde opacification of the PCOMs bilaterally. The left common carotid arteriogram demonstrates significant narrowing at the origin of the left external carotid artery secondary to an atherosclerotic plaque. The left internal carotid artery demonstrates a narrowing of approximately 60-65% by the NASCET criteria without evidence of acute ulcerations or of intraluminal filling defects secondary to a segmental cap like plaque. More distally the left internal carotid artery is seen to opacify to the cranial skull base. Wide patency is seen of the petrous, cavernous and the supraclinoid segments. The left middle cerebral artery and the left anterior cerebral artery opacify into the capillary and venous phases. Again seen arising in the left posterior communicating artery region is a saccular outpouching projecting inferiorly and also posteriorly. This measures approximately 3.7 mm x 2.5 mm. IMPRESSION: The left posterior communicating artery region outpouching measuring approximately 3.7 mm x 2.5 mm. Compared to the previous arteriogram of 2018, there has been an increase in the size of this outpouching probably representing an aneurysm with no discrete vessel seen emanating from the fundus of this outpouching. The right posterior communicating  artery region outpouching measuring 3 mm x 2.5 mm is essentially stable compared to the examination of August 2018. Approximately 60-65% stenosis of the left internal carotid artery at its origin by the NASCET criteria. This appears slightly worse compared to the August 2018 angiogram. Stable approximately 60-65% stenosis of the proximal right subclavian artery. PLAN: Findings reviewed with the patient. Patient reports to having stopped smoking approximately a year ago. To be follow-up in clinic to discuss management of the above angiographic findings to be scheduled as soon as possible. Electronically Signed   By: Luanne Bras M.D.   On: 03/19/2019 11:40   Ir Angio Intra Extracran Sel Com Carotid Innominate Bilat Mod Sed  Result Date: 03/20/2019 CLINICAL DATA:  History of worsening headaches over the past few months. The headaches almost constant generalized and pounding. Previous history of bilateral posterior communicating artery region outpouchings. EXAM: IR ANGIO VERTEBRAL SEL VERTEBRAL BILAT MOD SED; BILATERAL COMMON CAROTID AND INNOMINATE ANGIOGRAPHY; IR ULTRASOUND GUIDANCE VASC ACCESS RIGHT COMPARISON:  Diagnostic catheter arteriogram of January 05, 2017. MEDICATIONS: Heparin 1000 units IV. No antibiotic was administered within 1 hour of the procedure. ANESTHESIA/SEDATION: Versed 1.5 mg IV; Fentanyl 37.5 mcg IV Moderate Sedation Time:  68 minutes The patient was continuously monitored during the procedure by the interventional radiology nurse under my direct supervision. CONTRAST:  Isovue-300 approximately 60 mL. FLUOROSCOPY TIME:  Fluoroscopy Time: 9 minutes 18 seconds (621 mGy). COMPLICATIONS: None immediate. TECHNIQUE: Informed written consent was obtained from the patient after a thorough discussion of the procedural risks, benefits and alternatives. All questions were addressed. Maximal Sterile Barrier Technique was utilized including caps, mask, sterile gowns, sterile gloves, sterile drape,  hand hygiene and skin antiseptic. A timeout was performed prior to the initiation of the procedure. The right radial artery initially was evaluated with palpation and ultrasound. A dorsal palmar anastomosis was verified. Documentation of the right radial artery was obtained with ultrasound. Access was obtained into the right radial artery with a micropuncture set and exchanged over a 0.018 inch micro wire for a 4/5 French radial sheath. However, there was no back bleed obtained from the side port of the sheath. An ultrasound evaluation demonstrated severe spasm. This route was then abandoned. Hemostasis was achieved at the right radial puncture site with a pressure dressing. The right groin was prepped and draped in the usual sterile fashion. Thereafter using modified Seldinger technique, transfemoral access into the right common femoral artery was obtained without difficulty. Over a 0.035 inch guidewire, a 5 French Pinnacle sheath was inserted. Through this, and also over 0.035 inch guidewire, a 5 [place] JB 1 catheter was advanced to the aortic arch region and selectively positioned in the right common carotid artery, the right vertebral artery, the left common carotid artery and the left vertebral artery. FINDINGS: The innominate artery arteriogram demonstrates approximately 60%-65% stenosis of the proximal right subclavian artery. The right vertebral artery origin is widely patent. The vessel is seen to opacify to the cranial skull base. Wide patency is seen of the right posterior-inferior cerebellar artery and the right vertebrobasilar junction. The opacified portion of the basilar artery, the posterior cerebral arteries, the superior cerebellar arteries and the anterior-inferior cerebellar arteries is seen into the capillary and venous phases. Unopacified blood is seen in the basilar artery from the contralateral vertebral artery. The right common carotid arteriogram demonstrates severe narrowing at the origin  of the right external carotid artery. Its branches, however, opacify. The right internal carotid artery at the bulb has  a shallow shelf-like plaque with less than 10% stenosis by the NASCET criteria. No evidence of acute ulcerations are seen. More distally the vessel is seen to opacify to the cranial skull base. The petrous segment is widely patent. Mild atherosclerotic narrowing is seen of the caval cavernous segment. The supraclinoid segment is widely patent. The right middle cerebral artery and the right anterior cerebral artery opacify into the capillary and venous phases. Arising again in the right posterior communicating artery region is a saccular outpouching with a vessel emanating at its apex. This measures approximately 3 mm x 2.5 mm. The right vertebral artery origin is widely patent. There is mild tortuosity just distal to its origin. More distally the left vertebral artery is seen to opacify to the cranial skull base. Wide patency is seen of the left posterior-inferior cerebellar artery and the left vertebrobasilar junction. The basilar artery, the posterior cerebral arteries, the superior cerebellar arteries and the anterior-inferior cerebellar arteries opacify into the capillary and venous phases. There is transverse retrograde opacification of the PCOMs bilaterally. The left common carotid arteriogram demonstrates significant narrowing at the origin of the left external carotid artery secondary to an atherosclerotic plaque. The left internal carotid artery demonstrates a narrowing of approximately 60-65% by the NASCET criteria without evidence of acute ulcerations or of intraluminal filling defects secondary to a segmental cap like plaque. More distally the left internal carotid artery is seen to opacify to the cranial skull base. Wide patency is seen of the petrous, cavernous and the supraclinoid segments. The left middle cerebral artery and the left anterior cerebral artery opacify into the  capillary and venous phases. Again seen arising in the left posterior communicating artery region is a saccular outpouching projecting inferiorly and also posteriorly. This measures approximately 3.7 mm x 2.5 mm. IMPRESSION: The left posterior communicating artery region outpouching measuring approximately 3.7 mm x 2.5 mm. Compared to the previous arteriogram of 2018, there has been an increase in the size of this outpouching probably representing an aneurysm with no discrete vessel seen emanating from the fundus of this outpouching. The right posterior communicating artery region outpouching measuring 3 mm x 2.5 mm is essentially stable compared to the examination of August 2018. Approximately 60-65% stenosis of the left internal carotid artery at its origin by the NASCET criteria. This appears slightly worse compared to the August 2018 angiogram. Stable approximately 60-65% stenosis of the proximal right subclavian artery. PLAN: Findings reviewed with the patient. Patient reports to having stopped smoking approximately a year ago. To be follow-up in clinic to discuss management of the above angiographic findings to be scheduled as soon as possible. Electronically Signed   By: Luanne Bras M.D.   On: 03/19/2019 11:40   Ir Angio Vertebral Sel Vertebral Bilat Mod Sed  Result Date: 03/20/2019 CLINICAL DATA:  History of worsening headaches over the past few months. The headaches almost constant generalized and pounding. Previous history of bilateral posterior communicating artery region outpouchings. EXAM: IR ANGIO VERTEBRAL SEL VERTEBRAL BILAT MOD SED; BILATERAL COMMON CAROTID AND INNOMINATE ANGIOGRAPHY; IR ULTRASOUND GUIDANCE VASC ACCESS RIGHT COMPARISON:  Diagnostic catheter arteriogram of January 05, 2017. MEDICATIONS: Heparin 1000 units IV. No antibiotic was administered within 1 hour of the procedure. ANESTHESIA/SEDATION: Versed 1.5 mg IV; Fentanyl 37.5 mcg IV Moderate Sedation Time:  68 minutes The  patient was continuously monitored during the procedure by the interventional radiology nurse under my direct supervision. CONTRAST:  Isovue-300 approximately 60 mL. FLUOROSCOPY TIME:  Fluoroscopy Time: 9 minutes 18  seconds (621 mGy). COMPLICATIONS: None immediate. TECHNIQUE: Informed written consent was obtained from the patient after a thorough discussion of the procedural risks, benefits and alternatives. All questions were addressed. Maximal Sterile Barrier Technique was utilized including caps, mask, sterile gowns, sterile gloves, sterile drape, hand hygiene and skin antiseptic. A timeout was performed prior to the initiation of the procedure. The right radial artery initially was evaluated with palpation and ultrasound. A dorsal palmar anastomosis was verified. Documentation of the right radial artery was obtained with ultrasound. Access was obtained into the right radial artery with a micropuncture set and exchanged over a 0.018 inch micro wire for a 4/5 French radial sheath. However, there was no back bleed obtained from the side port of the sheath. An ultrasound evaluation demonstrated severe spasm. This route was then abandoned. Hemostasis was achieved at the right radial puncture site with a pressure dressing. The right groin was prepped and draped in the usual sterile fashion. Thereafter using modified Seldinger technique, transfemoral access into the right common femoral artery was obtained without difficulty. Over a 0.035 inch guidewire, a 5 French Pinnacle sheath was inserted. Through this, and also over 0.035 inch guidewire, a 5 [place] JB 1 catheter was advanced to the aortic arch region and selectively positioned in the right common carotid artery, the right vertebral artery, the left common carotid artery and the left vertebral artery. FINDINGS: The innominate artery arteriogram demonstrates approximately 60%-65% stenosis of the proximal right subclavian artery. The right vertebral artery origin is  widely patent. The vessel is seen to opacify to the cranial skull base. Wide patency is seen of the right posterior-inferior cerebellar artery and the right vertebrobasilar junction. The opacified portion of the basilar artery, the posterior cerebral arteries, the superior cerebellar arteries and the anterior-inferior cerebellar arteries is seen into the capillary and venous phases. Unopacified blood is seen in the basilar artery from the contralateral vertebral artery. The right common carotid arteriogram demonstrates severe narrowing at the origin of the right external carotid artery. Its branches, however, opacify. The right internal carotid artery at the bulb has a shallow shelf-like plaque with less than 10% stenosis by the NASCET criteria. No evidence of acute ulcerations are seen. More distally the vessel is seen to opacify to the cranial skull base. The petrous segment is widely patent. Mild atherosclerotic narrowing is seen of the caval cavernous segment. The supraclinoid segment is widely patent. The right middle cerebral artery and the right anterior cerebral artery opacify into the capillary and venous phases. Arising again in the right posterior communicating artery region is a saccular outpouching with a vessel emanating at its apex. This measures approximately 3 mm x 2.5 mm. The right vertebral artery origin is widely patent. There is mild tortuosity just distal to its origin. More distally the left vertebral artery is seen to opacify to the cranial skull base. Wide patency is seen of the left posterior-inferior cerebellar artery and the left vertebrobasilar junction. The basilar artery, the posterior cerebral arteries, the superior cerebellar arteries and the anterior-inferior cerebellar arteries opacify into the capillary and venous phases. There is transverse retrograde opacification of the PCOMs bilaterally. The left common carotid arteriogram demonstrates significant narrowing at the origin of  the left external carotid artery secondary to an atherosclerotic plaque. The left internal carotid artery demonstrates a narrowing of approximately 60-65% by the NASCET criteria without evidence of acute ulcerations or of intraluminal filling defects secondary to a segmental cap like plaque. More distally the left internal carotid artery  is seen to opacify to the cranial skull base. Wide patency is seen of the petrous, cavernous and the supraclinoid segments. The left middle cerebral artery and the left anterior cerebral artery opacify into the capillary and venous phases. Again seen arising in the left posterior communicating artery region is a saccular outpouching projecting inferiorly and also posteriorly. This measures approximately 3.7 mm x 2.5 mm. IMPRESSION: The left posterior communicating artery region outpouching measuring approximately 3.7 mm x 2.5 mm. Compared to the previous arteriogram of 2018, there has been an increase in the size of this outpouching probably representing an aneurysm with no discrete vessel seen emanating from the fundus of this outpouching. The right posterior communicating artery region outpouching measuring 3 mm x 2.5 mm is essentially stable compared to the examination of August 2018. Approximately 60-65% stenosis of the left internal carotid artery at its origin by the NASCET criteria. This appears slightly worse compared to the August 2018 angiogram. Stable approximately 60-65% stenosis of the proximal right subclavian artery. PLAN: Findings reviewed with the patient. Patient reports to having stopped smoking approximately a year ago. To be follow-up in clinic to discuss management of the above angiographic findings to be scheduled as soon as possible. Electronically Signed   By: Luanne Bras M.D.   On: 03/19/2019 11:40    Labs:  CBC: Recent Labs    10/23/18 0645 02/17/19 1154 03/19/19 0710 04/15/19 0715  WBC 8.1 8.0 7.2 6.8  HGB 11.1* 11.2* 12.3 12.0  HCT  33.7* 35.6* 38.6 37.3  PLT 395 403* 423* 397    COAGS: Recent Labs    03/19/19 0710 04/15/19 0730  INR 0.9 0.9    BMP: Recent Labs    10/23/18 0645 02/17/19 1154 03/19/19 0710 04/15/19 0715  NA 140 138 140 140  K 3.4* 4.4 4.2 4.0  CL 104 106 108 106  CO2 22 22 23 23   GLUCOSE 117* 100* 104* 95  BUN 15 14 12 13   CALCIUM 9.5 9.1 9.4 9.4  CREATININE 0.86 0.70 0.80 0.96  GFRNONAA >60 >60 >60 >60  GFRAA >60 >60 >60 >60    LIVER FUNCTION TESTS: Recent Labs    10/23/18 0645 02/17/19 1154  BILITOT 0.8 0.5  AST 23 21  ALT 8 18  ALKPHOS 70 75  PROT 6.9 7.1  ALBUMIN 3.8 3.8     Assessment and Plan:  Left PCOM aneurysm. Plan for image-guided cerebral arteriogram with possible embolization of left PCOM aneurysm this AM with Dr. Estanislado Pandy. Patient is NPO. Afebrile and WBCs WNL. Ok to proceed with Plavix/Aspirin use per Dr. Estanislado Pandy. INR 0.9 today. P2Y12 15 PRU this AM. COVID negative 04/11/2019.  Risks and benefits of cerebral arteriogram with intervention were discussed with the patient including, but not limited to bleeding, infection, vascular injury, contrast induced renal failure, stroke, reperfusion hemorrhage, or even death. This interventional procedure involves the use of X-rays and because of the nature of the planned procedure, it is possible that we will have prolonged use of X-ray fluoroscopy. Potential radiation risks to you include (but are not limited to) the following: - A slightly elevated risk for cancer  several years later in life. This risk is typically less than 0.5% percent. This risk is low in comparison to the normal incidence of human cancer, which is 33% for women and 50% for men according to the [place]. - Radiation induced injury can include skin redness, resembling a rash, tissue breakdown / ulcers and hair loss (which can  be temporary or permanent).  The likelihood of either of these occurring depends on the difficulty of  the procedure and whether you are sensitive to radiation due to previous procedures, disease, or genetic conditions.  IF your procedure requires a prolonged use of radiation, you will be notified and given written instructions for further action.  It is your responsibility to monitor the irradiated area for the 2 weeks following the procedure and to notify your physician if you are concerned that you have suffered a radiation induced injury. All of the patient's questions were answered, patient is agreeable to proceed. Consent signed and in chart.   Thank you for this interesting consult.  I greatly enjoyed meeting [name] and look forward to participating in their care.  A copy of this report was sent to the requesting provider on this date.  Electronically Signed: Earley Abide, PA-C 04/15/2019, 8:21 AM   I spent a total of 40 Minutes in face to face in clinical consultation, greater than 50% of which was counseling/coordinating care for left PCOM aneurysm/embolization.

## 2019-04-15 NOTE — Anesthesia Procedure Notes (Signed)
Arterial Line Insertion Start/End12/11/2018 8:35 AM, 04/15/2019 8:40 AM Performed by: Wilburn Cornelia, CRNA, CRNA  Patient location: Pre-op. Preanesthetic checklist: patient identified, IV checked, site marked, risks and benefits discussed, surgical consent, monitors and equipment checked, pre-op evaluation, timeout performed and anesthesia consent Lidocaine 1% used for infiltration Left, radial was placed Catheter size: 20 G Hand hygiene performed  and maximum sterile barriers used  Allen's test indicative of satisfactory collateral circulation Attempts: 1 Procedure performed without using ultrasound guided technique. Following insertion, Biopatch. Post procedure assessment: normal  Patient tolerated the procedure well with no immediate complications.

## 2019-04-15 NOTE — Anesthesia Postprocedure Evaluation (Signed)
Anesthesia Post Note  Patient: Erin [name]  Procedure(s) Performed: [employer] (N/A )     Patient location during evaluation: PACU Anesthesia Type: General Level of consciousness: awake and alert Pain management: pain level controlled Vital Signs Assessment: post-procedure vital signs reviewed and stable Respiratory status: spontaneous breathing, nonlabored ventilation, respiratory function stable and patient connected to nasal cannula oxygen Cardiovascular status: blood pressure returned to baseline and stable Postop Assessment: no apparent nausea or vomiting Anesthetic complications: no    Last Vitals:  Vitals:   04/15/19 1800 04/15/19 1900  BP: 113/71 (!) 88/58  Pulse: (!) 116 97  Resp: (!) 23 16  Temp:    SpO2: 96% 95%    Last Pain:  Vitals:   04/15/19 1819  TempSrc:   PainSc: 0-No pain                 Catalina Gravel

## 2019-04-15 NOTE — CV Procedure (Signed)
INR. Post procedure.  RT groin soft.Hemostasis achieved  with manual compression. Distal pulses dopplerable DPs and PTs bilaterally, unchanged.. Patient extubated. Maintaining O2 sats.  Alert ,awake . Oriented to place and year. Denies any H/S,N/V or visual symptoms. Pupils 2 to 3 mm RT = LT . NO facial asymmetry. Tongue  Midline. No pronation drift. Moves all 4s equally. S.Maksim Peregoy MD

## 2019-04-16 ENCOUNTER — Telehealth (HOSPITAL_COMMUNITY): Payer: Self-pay

## 2019-04-16 ENCOUNTER — Encounter (HOSPITAL_COMMUNITY): Payer: Self-pay | Admitting: Interventional Radiology

## 2019-04-16 DIAGNOSIS — I671 Cerebral aneurysm, nonruptured: Secondary | ICD-10-CM | POA: Diagnosis not present

## 2019-04-16 LAB — CBC WITH DIFFERENTIAL/PLATELET
Abs Immature Granulocytes: 0.04 10*3/uL (ref 0.00–0.07)
Basophils Absolute: 0 10*3/uL (ref 0.0–0.1)
Basophils Relative: 0 %
Eosinophils Absolute: 0 10*3/uL (ref 0.0–0.5)
Eosinophils Relative: 0 %
HCT: 28.8 % — ABNORMAL LOW (ref 36.0–46.0)
Hemoglobin: 9.4 g/dL — ABNORMAL LOW (ref 12.0–15.0)
Immature Granulocytes: 1 %
Lymphocytes Relative: 20 %
Lymphs Abs: 1.5 10*3/uL (ref 0.7–4.0)
MCH: 28.3 pg (ref 26.0–34.0)
MCHC: 32.6 g/dL (ref 30.0–36.0)
MCV: 86.7 fL (ref 80.0–100.0)
Monocytes Absolute: 0.6 10*3/uL (ref 0.1–1.0)
Monocytes Relative: 7 %
Neutro Abs: 5.7 10*3/uL (ref 1.7–7.7)
Neutrophils Relative %: 72 %
Platelets: 321 10*3/uL (ref 150–400)
RBC: 3.32 MIL/uL — ABNORMAL LOW (ref 3.87–5.11)
RDW: 15.9 % — ABNORMAL HIGH (ref 11.5–15.5)
WBC: 7.8 10*3/uL (ref 4.0–10.5)
nRBC: 0 % (ref 0.0–0.2)

## 2019-04-16 LAB — BASIC METABOLIC PANEL
Anion gap: 11 (ref 5–15)
BUN: 10 mg/dL (ref 6–20)
CO2: 20 mmol/L — ABNORMAL LOW (ref 22–32)
Calcium: 7.9 mg/dL — ABNORMAL LOW (ref 8.9–10.3)
Chloride: 109 mmol/L (ref 98–111)
Creatinine, Ser: 0.75 mg/dL (ref 0.44–1.00)
GFR calc Af Amer: >60 mL/min (ref >60)
GFR calc non Af Amer: >60 mL/min (ref >60)
Glucose, Bld: 109 mg/dL — ABNORMAL HIGH (ref 70–99)
Potassium: 4 mmol/L (ref 3.5–5.1)
Sodium: 140 mmol/L (ref 135–145)

## 2019-04-16 MED ORDER — ALPRAZOLAM 0.5 MG PO TABS: 1.0000 mg | ORAL_TABLET | Freq: Once | ORAL | Status: DC | PRN

## 2019-04-16 NOTE — Discharge Summary (Signed)
Patient ID: Erin [name] MRN: [id number] DOB/AGE: 1961/04/20 57 y.o.  Admit date: 04/15/2019 Discharge date: 04/16/2019  Supervising Physician: Luanne Bras  Patient Status: [facility] - In-pt  Admission Diagnoses: Left PCOM aneurysm  Discharge Diagnoses:  Active Problems:   Brain aneurysm   Discharged Condition: good  Hospital Course: 58 y/o F with history of HLD, CAD, CHF, NSTEMI (03/2016), mitral regurgitation, ischemic cardiomyopathy, anxiety, tobacco use and known left PCOM aneurysm who presented to [facility] yesterday for a planned cerebral angiogram with intervention. She underwent a left common carotid arteriogram followed by embolization of left ICA PCOM aneurysm via right CFA approach yesterday without complication. She was admitted for overnight observation with no concerns reported by patient or staff. Patient does report bilateral hand tremors which occur when she is anxious and states this is likely why she is having tremors, she typically takes Xanax 1 mg up to three times per day for anxiety at home and has not had any Xanax today. She reports some soreness at the groin puncture site but is otherwise feeling well. She has been eating and drinking without issue. She will try sitting up in the chair and ambulating later this morning. She has family at home to help care for her and she feels that she is ready to return home. She states understanding of return precautions and will call our office with any concerns prior to her 2 week follow up appointment.   Consults: None  Significant Diagnostic Studies: Ir US Guide Vasc Access Right  Result Date: 03/20/2019 CLINICAL DATA:  History of worsening headaches over the past few months. The headaches almost constant generalized and pounding. Previous history of bilateral posterior communicating artery region outpouchings. EXAM: IR ANGIO VERTEBRAL SEL VERTEBRAL BILAT MOD SED; BILATERAL COMMON CAROTID AND INNOMINATE ANGIOGRAPHY; IR  ULTRASOUND GUIDANCE VASC ACCESS RIGHT COMPARISON:  Diagnostic catheter arteriogram of January 05, 2017. MEDICATIONS: Heparin 1000 units IV. No antibiotic was administered within 1 hour of the procedure. ANESTHESIA/SEDATION: Versed 1.5 mg IV; Fentanyl 37.5 mcg IV Moderate Sedation Time:  68 minutes The patient was continuously monitored during the procedure by the interventional radiology nurse under my direct supervision. CONTRAST:  Isovue-300 approximately 60 mL. FLUOROSCOPY TIME:  Fluoroscopy Time: 9 minutes 18 seconds (621 mGy). COMPLICATIONS: None immediate. TECHNIQUE: Informed written consent was obtained from the patient after a thorough discussion of the procedural risks, benefits and alternatives. All questions were addressed. Maximal Sterile Barrier Technique was utilized including caps, mask, sterile gowns, sterile gloves, sterile drape, hand hygiene and skin antiseptic. A timeout was performed prior to the initiation of the procedure. The right radial artery initially was evaluated with palpation and ultrasound. A dorsal palmar anastomosis was verified. Documentation of the right radial artery was obtained with ultrasound. Access was obtained into the right radial artery with a micropuncture set and exchanged over a 0.018 inch micro wire for a 4/5 French radial sheath. However, there was no back bleed obtained from the side port of the sheath. An ultrasound evaluation demonstrated severe spasm. This route was then abandoned. Hemostasis was achieved at the right radial puncture site with a pressure dressing. The right groin was prepped and draped in the usual sterile fashion. Thereafter using modified Seldinger technique, transfemoral access into the right common femoral artery was obtained without difficulty. Over a 0.035 inch guidewire, a 5 French Pinnacle sheath was inserted. Through this, and also over 0.035 inch guidewire, a 5 [place] JB 1 catheter was advanced to the aortic arch region and  selectively  positioned in the right common carotid artery, the right vertebral artery, the left common carotid artery and the left vertebral artery. FINDINGS: The innominate artery arteriogram demonstrates approximately 60%-65% stenosis of the proximal right subclavian artery. The right vertebral artery origin is widely patent. The vessel is seen to opacify to the cranial skull base. Wide patency is seen of the right posterior-inferior cerebellar artery and the right vertebrobasilar junction. The opacified portion of the basilar artery, the posterior cerebral arteries, the superior cerebellar arteries and the anterior-inferior cerebellar arteries is seen into the capillary and venous phases. Unopacified blood is seen in the basilar artery from the contralateral vertebral artery. The right common carotid arteriogram demonstrates severe narrowing at the origin of the right external carotid artery. Its branches, however, opacify. The right internal carotid artery at the bulb has a shallow shelf-like plaque with less than 10% stenosis by the NASCET criteria. No evidence of acute ulcerations are seen. More distally the vessel is seen to opacify to the cranial skull base. The petrous segment is widely patent. Mild atherosclerotic narrowing is seen of the caval cavernous segment. The supraclinoid segment is widely patent. The right middle cerebral artery and the right anterior cerebral artery opacify into the capillary and venous phases. Arising again in the right posterior communicating artery region is a saccular outpouching with a vessel emanating at its apex. This measures approximately 3 mm x 2.5 mm. The right vertebral artery origin is widely patent. There is mild tortuosity just distal to its origin. More distally the left vertebral artery is seen to opacify to the cranial skull base. Wide patency is seen of the left posterior-inferior cerebellar artery and the left vertebrobasilar junction. The basilar artery, the posterior  cerebral arteries, the superior cerebellar arteries and the anterior-inferior cerebellar arteries opacify into the capillary and venous phases. There is transverse retrograde opacification of the PCOMs bilaterally. The left common carotid arteriogram demonstrates significant narrowing at the origin of the left external carotid artery secondary to an atherosclerotic plaque. The left internal carotid artery demonstrates a narrowing of approximately 60-65% by the NASCET criteria without evidence of acute ulcerations or of intraluminal filling defects secondary to a segmental cap like plaque. More distally the left internal carotid artery is seen to opacify to the cranial skull base. Wide patency is seen of the petrous, cavernous and the supraclinoid segments. The left middle cerebral artery and the left anterior cerebral artery opacify into the capillary and venous phases. Again seen arising in the left posterior communicating artery region is a saccular outpouching projecting inferiorly and also posteriorly. This measures approximately 3.7 mm x 2.5 mm. IMPRESSION: The left posterior communicating artery region outpouching measuring approximately 3.7 mm x 2.5 mm. Compared to the previous arteriogram of 2018, there has been an increase in the size of this outpouching probably representing an aneurysm with no discrete vessel seen emanating from the fundus of this outpouching. The right posterior communicating artery region outpouching measuring 3 mm x 2.5 mm is essentially stable compared to the examination of August 2018. Approximately 60-65% stenosis of the left internal carotid artery at its origin by the NASCET criteria. This appears slightly worse compared to the August 2018 angiogram. Stable approximately 60-65% stenosis of the proximal right subclavian artery. PLAN: Findings reviewed with the patient. Patient reports to having stopped smoking approximately a year ago. To be follow-up in clinic to discuss  management of the above angiographic findings to be scheduled as soon as possible. Electronically Signed  By: Luanne Bras M.D.   On: 03/19/2019 11:40   Ir Angio Intra Extracran Sel Com Carotid Innominate Bilat Mod Sed  Result Date: 03/20/2019 CLINICAL DATA:  History of worsening headaches over the past few months. The headaches almost constant generalized and pounding. Previous history of bilateral posterior communicating artery region outpouchings. EXAM: IR ANGIO VERTEBRAL SEL VERTEBRAL BILAT MOD SED; BILATERAL COMMON CAROTID AND INNOMINATE ANGIOGRAPHY; IR ULTRASOUND GUIDANCE VASC ACCESS RIGHT COMPARISON:  Diagnostic catheter arteriogram of January 05, 2017. MEDICATIONS: Heparin 1000 units IV. No antibiotic was administered within 1 hour of the procedure. ANESTHESIA/SEDATION: Versed 1.5 mg IV; Fentanyl 37.5 mcg IV Moderate Sedation Time:  68 minutes The patient was continuously monitored during the procedure by the interventional radiology nurse under my direct supervision. CONTRAST:  Isovue-300 approximately 60 mL. FLUOROSCOPY TIME:  Fluoroscopy Time: 9 minutes 18 seconds (621 mGy). COMPLICATIONS: None immediate. TECHNIQUE: Informed written consent was obtained from the patient after a thorough discussion of the procedural risks, benefits and alternatives. All questions were addressed. Maximal Sterile Barrier Technique was utilized including caps, mask, sterile gowns, sterile gloves, sterile drape, hand hygiene and skin antiseptic. A timeout was performed prior to the initiation of the procedure. The right radial artery initially was evaluated with palpation and ultrasound. A dorsal palmar anastomosis was verified. Documentation of the right radial artery was obtained with ultrasound. Access was obtained into the right radial artery with a micropuncture set and exchanged over a 0.018 inch micro wire for a 4/5 French radial sheath. However, there was no back bleed obtained from the side port of the  sheath. An ultrasound evaluation demonstrated severe spasm. This route was then abandoned. Hemostasis was achieved at the right radial puncture site with a pressure dressing. The right groin was prepped and draped in the usual sterile fashion. Thereafter using modified Seldinger technique, transfemoral access into the right common femoral artery was obtained without difficulty. Over a 0.035 inch guidewire, a 5 French Pinnacle sheath was inserted. Through this, and also over 0.035 inch guidewire, a 5 [place] JB 1 catheter was advanced to the aortic arch region and selectively positioned in the right common carotid artery, the right vertebral artery, the left common carotid artery and the left vertebral artery. FINDINGS: The innominate artery arteriogram demonstrates approximately 60%-65% stenosis of the proximal right subclavian artery. The right vertebral artery origin is widely patent. The vessel is seen to opacify to the cranial skull base. Wide patency is seen of the right posterior-inferior cerebellar artery and the right vertebrobasilar junction. The opacified portion of the basilar artery, the posterior cerebral arteries, the superior cerebellar arteries and the anterior-inferior cerebellar arteries is seen into the capillary and venous phases. Unopacified blood is seen in the basilar artery from the contralateral vertebral artery. The right common carotid arteriogram demonstrates severe narrowing at the origin of the right external carotid artery. Its branches, however, opacify. The right internal carotid artery at the bulb has a shallow shelf-like plaque with less than 10% stenosis by the NASCET criteria. No evidence of acute ulcerations are seen. More distally the vessel is seen to opacify to the cranial skull base. The petrous segment is widely patent. Mild atherosclerotic narrowing is seen of the caval cavernous segment. The supraclinoid segment is widely patent. The right middle cerebral artery and the  right anterior cerebral artery opacify into the capillary and venous phases. Arising again in the right posterior communicating artery region is a saccular outpouching with a vessel emanating at its apex. This measures  approximately 3 mm x 2.5 mm. The right vertebral artery origin is widely patent. There is mild tortuosity just distal to its origin. More distally the left vertebral artery is seen to opacify to the cranial skull base. Wide patency is seen of the left posterior-inferior cerebellar artery and the left vertebrobasilar junction. The basilar artery, the posterior cerebral arteries, the superior cerebellar arteries and the anterior-inferior cerebellar arteries opacify into the capillary and venous phases. There is transverse retrograde opacification of the PCOMs bilaterally. The left common carotid arteriogram demonstrates significant narrowing at the origin of the left external carotid artery secondary to an atherosclerotic plaque. The left internal carotid artery demonstrates a narrowing of approximately 60-65% by the NASCET criteria without evidence of acute ulcerations or of intraluminal filling defects secondary to a segmental cap like plaque. More distally the left internal carotid artery is seen to opacify to the cranial skull base. Wide patency is seen of the petrous, cavernous and the supraclinoid segments. The left middle cerebral artery and the left anterior cerebral artery opacify into the capillary and venous phases. Again seen arising in the left posterior communicating artery region is a saccular outpouching projecting inferiorly and also posteriorly. This measures approximately 3.7 mm x 2.5 mm. IMPRESSION: The left posterior communicating artery region outpouching measuring approximately 3.7 mm x 2.5 mm. Compared to the previous arteriogram of 2018, there has been an increase in the size of this outpouching probably representing an aneurysm with no discrete vessel seen emanating from the  fundus of this outpouching. The right posterior communicating artery region outpouching measuring 3 mm x 2.5 mm is essentially stable compared to the examination of August 2018. Approximately 60-65% stenosis of the left internal carotid artery at its origin by the NASCET criteria. This appears slightly worse compared to the August 2018 angiogram. Stable approximately 60-65% stenosis of the proximal right subclavian artery. PLAN: Findings reviewed with the patient. Patient reports to having stopped smoking approximately a year ago. To be follow-up in clinic to discuss management of the above angiographic findings to be scheduled as soon as possible. Electronically Signed   By: Luanne Bras M.D.   On: 03/19/2019 11:40   Ir Angio Vertebral Sel Vertebral Bilat Mod Sed  Result Date: 03/20/2019 CLINICAL DATA:  History of worsening headaches over the past few months. The headaches almost constant generalized and pounding. Previous history of bilateral posterior communicating artery region outpouchings. EXAM: IR ANGIO VERTEBRAL SEL VERTEBRAL BILAT MOD SED; BILATERAL COMMON CAROTID AND INNOMINATE ANGIOGRAPHY; IR ULTRASOUND GUIDANCE VASC ACCESS RIGHT COMPARISON:  Diagnostic catheter arteriogram of January 05, 2017. MEDICATIONS: Heparin 1000 units IV. No antibiotic was administered within 1 hour of the procedure. ANESTHESIA/SEDATION: Versed 1.5 mg IV; Fentanyl 37.5 mcg IV Moderate Sedation Time:  68 minutes The patient was continuously monitored during the procedure by the interventional radiology nurse under my direct supervision. CONTRAST:  Isovue-300 approximately 60 mL. FLUOROSCOPY TIME:  Fluoroscopy Time: 9 minutes 18 seconds (621 mGy). COMPLICATIONS: None immediate. TECHNIQUE: Informed written consent was obtained from the patient after a thorough discussion of the procedural risks, benefits and alternatives. All questions were addressed. Maximal Sterile Barrier Technique was utilized including caps, mask,  sterile gowns, sterile gloves, sterile drape, hand hygiene and skin antiseptic. A timeout was performed prior to the initiation of the procedure. The right radial artery initially was evaluated with palpation and ultrasound. A dorsal palmar anastomosis was verified. Documentation of the right radial artery was obtained with ultrasound. Access was obtained into the right radial  artery with a micropuncture set and exchanged over a 0.018 inch micro wire for a 4/5 French radial sheath. However, there was no back bleed obtained from the side port of the sheath. An ultrasound evaluation demonstrated severe spasm. This route was then abandoned. Hemostasis was achieved at the right radial puncture site with a pressure dressing. The right groin was prepped and draped in the usual sterile fashion. Thereafter using modified Seldinger technique, transfemoral access into the right common femoral artery was obtained without difficulty. Over a 0.035 inch guidewire, a 5 French Pinnacle sheath was inserted. Through this, and also over 0.035 inch guidewire, a 5 [place] JB 1 catheter was advanced to the aortic arch region and selectively positioned in the right common carotid artery, the right vertebral artery, the left common carotid artery and the left vertebral artery. FINDINGS: The innominate artery arteriogram demonstrates approximately 60%-65% stenosis of the proximal right subclavian artery. The right vertebral artery origin is widely patent. The vessel is seen to opacify to the cranial skull base. Wide patency is seen of the right posterior-inferior cerebellar artery and the right vertebrobasilar junction. The opacified portion of the basilar artery, the posterior cerebral arteries, the superior cerebellar arteries and the anterior-inferior cerebellar arteries is seen into the capillary and venous phases. Unopacified blood is seen in the basilar artery from the contralateral vertebral artery. The right common carotid  arteriogram demonstrates severe narrowing at the origin of the right external carotid artery. Its branches, however, opacify. The right internal carotid artery at the bulb has a shallow shelf-like plaque with less than 10% stenosis by the NASCET criteria. No evidence of acute ulcerations are seen. More distally the vessel is seen to opacify to the cranial skull base. The petrous segment is widely patent. Mild atherosclerotic narrowing is seen of the caval cavernous segment. The supraclinoid segment is widely patent. The right middle cerebral artery and the right anterior cerebral artery opacify into the capillary and venous phases. Arising again in the right posterior communicating artery region is a saccular outpouching with a vessel emanating at its apex. This measures approximately 3 mm x 2.5 mm. The right vertebral artery origin is widely patent. There is mild tortuosity just distal to its origin. More distally the left vertebral artery is seen to opacify to the cranial skull base. Wide patency is seen of the left posterior-inferior cerebellar artery and the left vertebrobasilar junction. The basilar artery, the posterior cerebral arteries, the superior cerebellar arteries and the anterior-inferior cerebellar arteries opacify into the capillary and venous phases. There is transverse retrograde opacification of the PCOMs bilaterally. The left common carotid arteriogram demonstrates significant narrowing at the origin of the left external carotid artery secondary to an atherosclerotic plaque. The left internal carotid artery demonstrates a narrowing of approximately 60-65% by the NASCET criteria without evidence of acute ulcerations or of intraluminal filling defects secondary to a segmental cap like plaque. More distally the left internal carotid artery is seen to opacify to the cranial skull base. Wide patency is seen of the petrous, cavernous and the supraclinoid segments. The left middle cerebral artery and  the left anterior cerebral artery opacify into the capillary and venous phases. Again seen arising in the left posterior communicating artery region is a saccular outpouching projecting inferiorly and also posteriorly. This measures approximately 3.7 mm x 2.5 mm. IMPRESSION: The left posterior communicating artery region outpouching measuring approximately 3.7 mm x 2.5 mm. Compared to the previous arteriogram of 2018, there has been an increase in the  size of this outpouching probably representing an aneurysm with no discrete vessel seen emanating from the fundus of this outpouching. The right posterior communicating artery region outpouching measuring 3 mm x 2.5 mm is essentially stable compared to the examination of August 2018. Approximately 60-65% stenosis of the left internal carotid artery at its origin by the NASCET criteria. This appears slightly worse compared to the August 2018 angiogram. Stable approximately 60-65% stenosis of the proximal right subclavian artery. PLAN: Findings reviewed with the patient. Patient reports to having stopped smoking approximately a year ago. To be follow-up in clinic to discuss management of the above angiographic findings to be scheduled as soon as possible. Electronically Signed   By: Luanne Bras M.D.   On: 03/19/2019 11:40    Treatments: IV hydration and anticoagulation: ASA, Plavix and heparin  Discharge Exam: Blood pressure 127/81, pulse 90, temperature 98.3 F (36.8 C), temperature source Oral, resp. rate 16, height 5' (1.524 m), weight 132 lb 7.9 oz (60.1 kg), SpO2 99 %. Physical Exam Vitals signs and nursing note reviewed.  Constitutional:      General: She is not in acute distress.    Appearance: She is not ill-appearing.  HENT:     Head: Normocephalic.     Mouth/Throat:     Mouth: Mucous membranes are moist.     Pharynx: Oropharynx is clear. No oropharyngeal exudate or posterior oropharyngeal erythema.     Comments: (+) slight tongue  tremor Cardiovascular:     Rate and Rhythm: Normal rate and regular rhythm.  Pulmonary:     Effort: Pulmonary effort is normal.     Breath sounds: Normal breath sounds.  Abdominal:     General: There is no distension.     Palpations: Abdomen is soft.     Tenderness: There is no abdominal tenderness.  Skin:    General: Skin is warm and dry.     Comments: (+) right CFA puncture site clean, dry, dressed appropriately.  Mild soreness to deep palpation only.   Neurological:     Mental Status: She is alert.   Alert, awake, and oriented x4 Speech and comprehension in tact PERRL bilaterally EOMs without nystagmus or subjective diplopia. Visual fields in tact bilaterally No obvious facial asymmetry. Tongue midline with slight tremot Motor power full bilaterally Negative pronator drift. Fine motor and coordination in tact bilaterally - fine bilateral hand tremor, improving per patient. Gait - not assessed Romberg - not assessed Heel to toe  - not assesed Distal pulses palpable bilaterally.    Disposition: Discharge disposition: 01-Home or Self Care        Allergies as of 04/16/2019      Reactions   Tape Other (See Comments)   PEELS SKIN OFF  (PAPER TAPE IS FINE)      Medication List    TAKE these medications   acetaminophen 325 MG tablet Commonly known as: TYLENOL Take 325 mg by mouth every 6 (six) hours as needed for mild pain or headache.   ALPRAZolam 1 MG tablet Commonly known as: XANAX Take 1 mg by mouth at bedtime.   aspirin EC 81 MG tablet Take 81 mg by mouth every evening.   atorvastatin 80 MG tablet Commonly known as: LIPITOR TAKE 1 TABLET BY MOUTH ONCE DAILY AT  6  IN  THE  EVENING   calcium carbonate 500 MG chewable tablet Commonly known as: TUMS - dosed in mg elemental calcium Chew 1 tablet by mouth 3 times/day as needed-between meals &  bedtime for indigestion or heartburn.   clopidogrel 75 MG tablet Commonly known as: PLAVIX Take 75 mg by mouth  daily.   Entresto 24-26 MG Generic drug: sacubitril-valsartan Take 1 tablet by mouth 2 (two) times daily.   ezetimibe 10 MG tablet Commonly known as: ZETIA Take 1 tablet by mouth once daily   furosemide 20 MG tablet Commonly known as: LASIX Take 1 tablet by mouth once daily   gabapentin 100 MG capsule Commonly known as: NEURONTIN Take 100 mg by mouth 3 (three) times daily.   loperamide 2 MG capsule Commonly known as: IMODIUM Take 2 mg by mouth 4 (four) times daily as needed for diarrhea or loose stools (ibs).   LORazepam 1 MG tablet Commonly known as: ATIVAN Take 1 mg by mouth daily as needed for anxiety.   metoprolol tartrate 25 MG tablet Commonly known as: LOPRESSOR Take 0.5 tablets (12.5 mg total) by mouth 2 (two) times daily.   multivitamin with minerals tablet Take 1 tablet by mouth daily.   nicotine 21 mg/24hr patch Commonly known as: NICODERM CQ - dosed in mg/24 hours Place 21 mg onto the skin daily.   nitroGLYCERIN 0.4 MG SL tablet Commonly known as: NITROSTAT Place 1 tablet (0.4 mg total) under the tongue every 5 (five) minutes x 3 doses as needed for chest pain.   ondansetron 4 MG disintegrating tablet Commonly known as: Zofran ODT Take 1 tablet (4 mg total) by mouth every 8 (eight) hours as needed.   potassium chloride SA 20 MEQ tablet Commonly known as: KLOR-CON Take 1 tablet by mouth once daily   rOPINIRole 0.5 MG tablet Commonly known as: REQUIP Take 0.5 mg by mouth at bedtime.   traMADol 50 MG tablet Commonly known as: ULTRAM Take 1 tablet (50 mg total) by mouth every 6 (six) hours as needed. What changed: reasons to take this      Follow-up Information    Luanne Bras, MD Follow up.   Specialties: Interventional Radiology, Radiology Why: IR scheduler will call you with appointment date/time (~2 weeks from procedure date). Please call [phone] or [phone] with questions or concerns prior to your appointment. Contact  information: [place] [address] [phone]            Electronically Signed: Joaquim Nam, PA-C 04/16/2019, 9:10 AM   I have spent Less Than 30 Minutes discharging [name].

## 2019-04-16 NOTE — Telephone Encounter (Signed)
Called to schedule 2 wk f/u, no answer, left vm. AW 

## 2019-04-16 NOTE — Progress Notes (Signed)
Pt discharged from unit.  IV removed, all questions answered.

## 2019-04-29 ENCOUNTER — Other Ambulatory Visit: Payer: Self-pay | Admitting: Radiology

## 2019-04-30 ENCOUNTER — Ambulatory Visit (HOSPITAL_COMMUNITY): Admission: RE | Admit: 2019-04-30 | Payer: No Typology Code available for payment source | Source: Ambulatory Visit

## 2019-05-08 ENCOUNTER — Other Ambulatory Visit: Payer: Self-pay

## 2019-05-08 ENCOUNTER — Other Ambulatory Visit: Payer: Self-pay | Admitting: Cardiovascular Disease

## 2019-05-08 ENCOUNTER — Ambulatory Visit (HOSPITAL_COMMUNITY)
Admission: RE | Admit: 2019-05-08 | Discharge: 2019-05-08 | Disposition: A | Discharge status: Home / Self Care | Payer: No Typology Code available for payment source | Source: Ambulatory Visit | Attending: Physician Assistant | Admitting: Physician Assistant

## 2019-05-08 ENCOUNTER — Telehealth: Payer: Self-pay | Admitting: Student

## 2019-05-08 DIAGNOSIS — I671 Cerebral aneurysm, nonruptured: Secondary | ICD-10-CM | POA: Diagnosis present

## 2019-05-08 LAB — PLATELET INHIBITION P2Y12: Platelet Function  P2Y12: 3 [PRU] — ABNORMAL LOW (ref 182–335)

## 2019-05-08 NOTE — Progress Notes (Signed)
Chief Complaint: Patient was seen in consultation today for left ICA/PCOM aneurysm s/p embolization.  Referring Physician(s): Alda Berthold  Supervising Physician: Luanne Bras  Patient Status: [facility] - Out-pt  History of Present Illness: Erin [name] is a 58 y.o. female with a past medical history as below, with pertinent past medical history including hyperlipidemia, CAD, HF, NSTEMI 03/2016, mitral regurgitation, ischemic cardiomyopathy, pneumonia, IBS, thrombocytosis, anxiety, and tobacco abuse. She is known to [facility] and has been followed by Dr. Estanislado Pandy since 12/2016. She first presented to our department at the request of Dr. Posey Pronto for management of a left PCOM aneurysm. At that time, patient decided to pursue conservative management for her aneurysm, including routine imaging scans/cererbal arteriograms to monitor for changes. Her most recent image-guided diagnostic cerebral arteriogram on 03/19/2019 by Dr. Estanislado Pandy revealed a stable right PCOM aneurysm and a left PCOM aneurysm that has increased in size compared to prior studies. At that time, patient decided to pursue endovascular embolization of left PCOM aneurysm. She underwent an image-guided cerebral arteriogram with embolization of left ICA/PCOM aneurysm using a pipeline flex flow diverter 04/15/2019 by Dr. Estanislado Pandy. She was discharged home 04/16/2019 in stable condition. Of note, patient also with known left ICA stenosis at the bulb, seen on diagnostic cerebral arteriogram.  Patient presents today for follow-up regarding her recent procedure 04/15/2019. Patient awake and alert sitting in chair. Accompanied by husband. Complains of intermittent lightheadedness/dizziness when changing positions. Complains of bilateral blurred vision x weeks. Denies diplopia/curtain over eyes/complete blindness. States its "hard to catch my breath sometimes". Denies headache, weakness, numbness/tingling, hearing changes, tinnitus, or speech  difficulty.  Currently taking Plavix 75 mg once daily and Aspirin 81 once daily.   Past Medical History:  Diagnosis Date  . Anxiety state 03/25/2016  . Basal cell carcinoma of forehead   . CHF (congestive heart failure) ([place])   . Coronary artery disease    a. 03/11/16 PCI with DES-->Prox/Mid Cx;  b. 03/14/16 PCI with DES x2-->RCA, EF 30-35%.  . Essential hypertension   . GERD (gastroesophageal reflux disease)   . HFrEF (heart failure with reduced ejection fraction) ([place])    a. 10/2016 Echo: EF 35-40%, Gr1 DD, mild focal basal septal hypertrophy, basal inflat, mid inflat, basal antlat AK. Mid infept/inf/antlat, apical lateral sev HK. Mod MR. mildly reduced RV fxn. Mild TR.  [name] Kitchen History of pneumonia   . Hyperlipidemia   . IBS (irritable bowel syndrome)    diarrhea  . Ischemic cardiomyopathy    a. 10/2016 Echo: EF 35-40%, Gr1 DD.  [name] Kitchen Mitral regurgitation   . NSTEMI (non-ST elevated myocardial infarction) ([place]) 03/10/2016  . Pneumonia 03/2016  . Squamous cell cancer of skin of nose   . Thrombocytosis ([place]) 03/26/2016  . Tobacco abuse   . Trichimoniasis   . Wears dentures   . Wears glasses     Past Surgical History:  Procedure Laterality Date  . APPENDECTOMY    . BIOPSY  09/20/2018   Procedure: BIOPSY;  Surgeon: Daneil Dolin, MD;  Location[facility];  Service: Endoscopy;;  colon  . CARDIAC CATHETERIZATION N/A 03/11/2016   Procedure: Left Heart Cath and Coronary Angiography;  Surgeon: Leonie Man, MD;  Location: [place] CV LAB;  Service: Cardiovascular;  Laterality: N/A;  . CARDIAC CATHETERIZATION N/A 03/11/2016   Procedure: Coronary Stent Intervention;  Surgeon: Leonie Man, MD;  Location: [place] [facility];  Service: Cardiovascular;  Laterality: N/A;  . CARDIAC CATHETERIZATION N/A 03/14/2016   Procedure:  Coronary Stent Intervention;  Surgeon: Peter M Martinique, MD;  Location: [place] CV LAB;  Service: Cardiovascular;  Laterality: N/A;  . CHOLECYSTECTOMY OPEN  1984   . COLONOSCOPY WITH PROPOFOL N/A 09/20/2018   Procedure: COLONOSCOPY WITH PROPOFOL;  Surgeon: Daneil Dolin, MD;  [facility];  Service: Endoscopy;  Laterality: N/A;  10:30am  . CORONARY ANGIOPLASTY WITH STENT PLACEMENT  03/14/2016  . ESOPHAGOGASTRODUODENOSCOPY (EGD) WITH PROPOFOL N/A 09/20/2018   Procedure: ESOPHAGOGASTRODUODENOSCOPY (EGD) WITH PROPOFOL;  Surgeon: Daneil Dolin, MD;  [facility];  Service: Endoscopy;  Laterality: N/A;  . FINGER ARTHROPLASTY Left 05/14/2013   Procedure: LEFT THUMB CARPAL METACARPAL ARTHROPLASTY;  Surgeon: Tennis Must, MD;  Location: [place];  Service: Orthopedics;  Laterality: Left;  . IR ANGIO INTRA EXTRACRAN SEL COM CAROTID INNOMINATE BILAT MOD SED  01/05/2017  . IR ANGIO INTRA EXTRACRAN SEL COM CAROTID INNOMINATE BILAT MOD SED  03/19/2019  . IR ANGIO INTRA EXTRACRAN SEL INTERNAL CAROTID UNI L MOD SED  04/15/2019  . IR ANGIO VERTEBRAL SEL VERTEBRAL BILAT MOD SED  01/05/2017  . IR ANGIO VERTEBRAL SEL VERTEBRAL BILAT MOD SED  03/19/2019  . IR ANGIOGRAM FOLLOW UP STUDY  04/15/2019  . IR RADIOLOGIST EVAL & MGMT  12/30/2016  . IR TRANSCATH/EMBOLIZ  04/15/2019  . IR US GUIDE VASC ACCESS RIGHT  03/19/2019  . MALONEY DILATION N/A 09/20/2018   Procedure: Venia Minks DILATION;  Surgeon: Daneil Dolin, MD;  [facility];  Service: Endoscopy;  Laterality: N/A;  . RADIOLOGY WITH ANESTHESIA N/A 04/15/2019   Procedure: [employer];  Surgeon: Luanne Bras, MD;  Location: [place];  Service: Radiology;  Laterality: N/A;  . TUBAL LIGATION  1987  . VAGINAL HYSTERECTOMY  2009    Allergies: Tape  Medications: Prior to Admission medications   Medication Sig Start Date End Date Taking? Authorizing Provider  acetaminophen (TYLENOL) 325 MG tablet Take 325 mg by mouth every 6 (six) hours as needed for mild pain or headache.     [provider]  ALPRAZolam Duanne Moron) 1 MG tablet Take 1 mg by mouth at bedtime.     [provider]  aspirin EC 81 MG tablet Take 81 mg by mouth every evening.     [provider]  atorvastatin (LIPITOR) 80 MG tablet TAKE 1 TABLET BY MOUTH ONCE DAILY AT  6  IN  THE  EVENING 04/10/19   Herminio Commons, MD  calcium carbonate (TUMS - DOSED IN MG ELEMENTAL CALCIUM) 500 MG chewable tablet Chew 1 tablet by mouth 3 times/day as needed-between meals & bedtime for indigestion or heartburn.    [provider]  clopidogrel (PLAVIX) 75 MG tablet Take 75 mg by mouth daily.    [provider]  ezetimibe (ZETIA) 10 MG tablet Take 1 tablet by mouth once daily 04/10/19   Herminio Commons, MD  furosemide (LASIX) 20 MG tablet Take 1 tablet by mouth once daily 03/26/19   Herminio Commons, MD  gabapentin (NEURONTIN) 100 MG capsule Take 100 mg by mouth 3 (three) times daily.    [provider]  loperamide (IMODIUM) 2 MG capsule Take 2 mg by mouth 4 (four) times daily as needed for diarrhea or loose stools (ibs).    [provider]  LORazepam (ATIVAN) 1 MG tablet Take 1 mg by mouth daily as needed for anxiety.  11/09/17   [provider]  metoprolol tartrate (LOPRESSOR) 25 MG tablet Take 0.5 tablets (  12.5 mg total) by mouth 2 (two) times daily. 02/22/19 05/23/19  Herminio Commons, MD  Multiple Vitamins-Minerals (MULTIVITAMIN WITH MINERALS) tablet Take 1 tablet by mouth daily.    [provider]  nicotine (NICODERM CQ - DOSED IN MG/24 HOURS) 21 mg/24hr patch Place 21 mg onto the skin daily.    [provider]  nitroGLYCERIN (NITROSTAT) 0.4 MG SL tablet Place 1 tablet (0.4 mg total) under the tongue every 5 (five) minutes x 3 doses as needed for chest pain. 03/05/18   Manuella Ghazi, Pratik D, DO  ondansetron (ZOFRAN ODT) 4 MG disintegrating tablet Take 1 tablet (4 mg total) by mouth every 8 (eight) hours as needed. 10/23/18   Fredia Sorrow, MD  potassium chloride SA (KLOR-CON) 20 MEQ tablet Take 1 tablet by mouth once daily 04/10/19    Herminio Commons, MD  rOPINIRole (REQUIP) 0.5 MG tablet Take 0.5 mg by mouth at bedtime.  11/13/17   [provider]  sacubitril-valsartan (ENTRESTO) 24-26 MG Take 1 tablet by mouth 2 (two) times daily. 02/22/19   Herminio Commons, MD  traMADol (ULTRAM) 50 MG tablet Take 1 tablet (50 mg total) by mouth every 6 (six) hours as needed. Patient taking differently: Take 50 mg by mouth every 6 (six) hours as needed (pain).  12/22/18   Evalee Jefferson, PA-C     Family History  Problem Relation Age of Onset  . Diabetes Father   . Hypertension Father   . CAD Father   . Colon polyps Father 60       pre-cancerous   . Stroke Father   . Dementia Father   . Stroke Mother   . Hypertension Mother   . Diabetes Mother   . Heart failure Other   . Breast cancer Maternal Grandmother   . Colon cancer Neg Hx     Social History   Socioeconomic History  . Marital status: Married    Spouse name: Not on file  . Number of children: Not on file  . Years of education: Not on file  . Highest education level: Not on file  Occupational History  . Occupation: CNA  Tobacco Use  . Smoking status: Current Some Day Smoker    Packs/day: 0.10    Years: 15.00    Pack years: 1.50    Types: Cigarettes  . Smokeless tobacco: Never Used  . Tobacco comment: smokes a cigarette occasionally  Substance and Sexual Activity  . Alcohol use: Not Currently    Comment: occasionally  . Drug use: Not Currently    Types: Marijuana    Comment: former- 2017 last time  . Sexual activity: Not Currently    Birth control/protection: Surgical    Comment: hyst  Other Topics Concern  . Not on file  Social History Narrative   Lives with husband in [place] in a one story home with a basement.  Has 4 children.  Works as a Quarry manager.  Education: CNA school.    Social Determinants of Health   Financial Resource Strain:   . Difficulty of Paying Living Expenses: Not on file  Food Insecurity:   . Worried About Sales executive in the Last Year: Not on file  . Ran Out of Food in the Last Year: Not on file  Transportation Needs:   . Lack of Transportation (Medical): Not on file  . Lack of Transportation (Non-Medical): Not on file  Physical Activity:   . Days of Exercise per Week: Not on file  .  Minutes of Exercise per Session: Not on file  Stress:   . Feeling of Stress : Not on file  Social Connections:   . Frequency of Communication with Friends and Family: Not on file  . Frequency of Social Gatherings with Friends and Family: Not on file  . Attends Religious Services: Not on file  . Active Member of Clubs or Organizations: Not on file  . Attends Archivist Meetings: Not on file  . Marital Status: Not on file     Review of Systems: A 12 point ROS discussed and pertinent positives are indicated in the HPI above.  All other systems are negative.  Review of Systems  Constitutional: Negative for chills and fever.  HENT: Negative for hearing loss and tinnitus.   Eyes: Positive for visual disturbance.  Respiratory: Positive for shortness of breath. Negative for wheezing.   Cardiovascular: Negative for chest pain and palpitations.  Neurological: Positive for dizziness and light-headedness. Negative for speech difficulty, weakness, numbness and headaches.  Psychiatric/Behavioral: Negative for behavioral problems and confusion.    Physical Exam Constitutional:      General: She is not in acute distress.    Appearance: Normal appearance.  Pulmonary:     Effort: Pulmonary effort is normal. No respiratory distress.  Skin:    General: Skin is warm and dry.  Neurological:     Mental Status: She is alert and oriented to person, place, and time.  Psychiatric:        Mood and Affect: Mood normal.      Imaging: IR Transcath/Emboliz  Result Date: 04/18/2019 CLINICAL DATA:  Enlarging left posterior communicating artery region aneurysm on recent diagnostic arteriogram. EXAM: TRANSCATHETER  THERAPY EMBOLIZATION COMPARISON:  Diagnostic arteriogram of March 19, 2019 and January 05, 2017. MEDICATIONS: Heparin 3,000 units IV; Ancef 2 g IV antibiotic was administered within 1 hour of the procedure. ANESTHESIA/SEDATION: General anesthesia. CONTRAST:  Isovue 300 approximately 60 mL. FLUOROSCOPY TIME:  Fluoroscopy Time: 35 minutes 0 seconds (1132 mGy). COMPLICATIONS: None immediate. TECHNIQUE: Informed written consent was obtained from the patient after a thorough discussion of the procedural risks, benefits and alternatives. All questions were addressed. Maximal Sterile Barrier Technique was utilized including caps, mask, sterile gowns, sterile gloves, sterile drape, hand hygiene and skin antiseptic. A timeout was performed prior to the initiation of the procedure. The right groin was prepped and draped in the usual sterile fashion. Thereafter using modified Seldinger technique, transfemoral access into the right common femoral artery was obtained without difficulty. Over a 0.035 inch guidewire, a 5 French Pinnacle sheath was inserted. Through this, and also over 0.035 inch guidewire, a 5 [place] JB 1 catheter was advanced to the aortic arch region and selectively positioned in the left common carotid artery. FINDINGS: The left common carotid arteriogram demonstrates the left external carotid artery and its major branches to be widely patent. The left internal carotid artery at the bulb has approximately 60% stenosis by the NASCET criteria. No evidence of acute ulcerations or intraluminal filling defects is seen. More distally, the left internal carotid artery is seen to opacify normally to the cranial skull base. Wide patency is seen of the petrous, cavernous and supraclinoid segments. Again demonstrated in the left posterior communicating artery region is a saccular outpouching projecting inferiorly and posteriorly which appears increased in size since the previous arteriograms as mentioned previously  measuring approximately 3.7 mm x 2.5 mm with a wide neck. The left middle cerebral artery and the left anterior cerebral artery opacify  into the capillary and venous phases. ENDOVASCULAR EMBOLIZATION OF THE LEFT INTERNAL CAROTID ARTERY POSTERIOR COMMUNICATING ARTERY REGION ANEURYSM WITH PIPELINE FLOW DIVERTER The diagnostic JB 1 catheter in the left common carotid artery was exchanged over a 0.035 inch 300 cm Rosen exchange guidewire for an 80 cm 8 [place] Neuron Max sheath. Guidewire was removed. Good aspiration obtained from the hub of the Neuron Max sheath. Gentle control arteriogram performed through the sheath demonstrated no change in the extracranial or intracranial left-sided circulation. Over a 0.035 inch Roadrunner guidewire, using biplane roadmap technique and constant fluoroscopic guidance a 115 cm 5 [place] Catalyst guide catheter was then advanced without difficulty to the horizontal petrous segment of the left internal carotid artery. The guidewire was removed. Good aspiration obtained from the hub of the 5 [place] Catalyst guide catheter. Control arteriogram performed through this intracranially demonstrated no change in the left posterior communicating artery region aneurysm or of the middle or the anterior cerebral artery circulations. Measurements were then performed of the left internal carotid artery distal landing zone which was just proximal to the left internal carotid artery terminus, and the left internal carotid artery just proximal to the caval segment. Over a 0.014 inch standard Synchro micro guidewire with a J configuration, an 027 150 cm Phenom microcatheter was advanced out difficulty to the M2 M3 region of the codominant superior division of the left middle cerebral artery. The guidewire was removed. Good aspiration obtained from the hub of the microcatheter. Gentle control arteriogram performed through the microcatheter demonstrated safe position of the tip of the microcatheter which  was then connected to continuous heparinized saline infusion. Having considerable measurements of the left internal carotid artery proximal and distal landing zone at the site of the left posterior communicating artery aneurysm, it was decided to proceed with embolization by placement of a 3.5 mm x 14 mm pipeline flex flow diverter device. This was then advanced in a coaxial manner and with constant heparinized saline infusion to the distal end of the microcatheter. The O ring on the delivery microcatheter was loosened. With slight forward traction with the right hand on the delivery micro guidewire, with the left hand the delivery microcatheter was retrieved unsheathing the distal wire and then a few mm of the distal end of the device. Once the cigar configuration was seen to flare, the combination was retrieved to the distal landing zone which was at the left internal carotid termination. Once there, using the push and pull technique with advancement of the micro guidewire and fluffing technique, device was fully implanted with excellent coverage of the aneurysm distally and also proximally. The proximal portion of the device following deployment was seen to be fully apposed just distal to the caval cavernous segment. No evidence of endovascular leak was noted angiographically. The delivery micro guidewire was then captured by advancing the microcatheter and the tube removed. Control arteriograms performed at 15 and 30 minutes post deployment of the device continued to demonstrate excellent flow through the device without evidence of intraluminal filling defects or of occlusions intracranially. A final control arteriogram performed through the Neuron Max sheath in the left common carotid artery following removal of the Catalyst guide catheter continued to demonstrate excellent flow through the extracranial and intracranial ICA and also the left middle and the left anterior cerebral arteries. Stasis of contrast was  seen in the aneurysm treated. No evidence of intra device irregularities to suggest platelet aggregation was noted. The Neuron Max sheath was then removed following hemostasis  in the right groin manually. The right groin appeared soft without evidence of hematoma or bleeding. Distal pulses remained Dopplerable in the dorsalis pedis, and the posterior tibial regions bilaterally unchanged. Throughout the procedure, the patient's ACT was maintained in the region of approximately 180 seconds. The patient was also given a total of 3 mg of intra-arterial Integrilin to prevent platelet aggregation within the device itself. The patient's general anesthesia was then reversed and the patient was extubated without difficulty. Upon recovery, the patient denied any headaches, nausea, vomiting, visual, motor, sensory abnormalities. She was then transferred to the neuro ICU to continue on low-dose IV heparin, close monitoring of her blood pressure and neurological examination. Toward the end of the day, the patient was fully alert, awake and able to tolerate free liquids without difficulty. Overnight her stay was unremarkable. The following morning, the IV heparin was stopped and the patient switched to aspirin 325 mg a day, and Plavix 75 mg a day. The right groin appeared soft with distal pulses Dopplerable and unchanged. She was then ambulated independently prior to her discharge home to her husband. She was advised to continue taking her dual antiplatelets and home medications. She was advised to maintain adequate hydration and refrain from smoking. She was advised to refrain from stooping, bending or lifting weights above 10 pounds for 2 weeks. Should she develop any neurological symptoms or signs the patient was advised to call [phone]. The patient expressed understanding and agreement with the above management plan. IMPRESSION: Status post endovascular embolization of wide neck left internal carotid artery intracranial posterior  communicating artery region aneurysm with pipeline flex flow diverter device. PLAN: Return to clinic for follow-up in approximately 2 weeks. Electronically Signed   By: Luanne Bras M.D.   On: 04/16/2019 13:57   IR Angiogram Follow Up Study  Result Date: 04/15/2019 CLINICAL DATA:  Enlarging left posterior communicating artery region aneurysm on recent diagnostic arteriogram.  EXAM: TRANSCATHETER THERAPY EMBOLIZATION  COMPARISON:  Diagnostic arteriogram of March 19, 2019 and January 05, 2017.  MEDICATIONS: Heparin 3,000 units IV; Ancef 2 g IV antibiotic was administered within 1 hour of the procedure.  ANESTHESIA/SEDATION: General anesthesia.  CONTRAST:  Isovue 300 approximately 60 mL.  FLUOROSCOPY TIME:  Fluoroscopy Time: 35 minutes 0 seconds (1132 mGy).  COMPLICATIONS: None immediate.  TECHNIQUE: Informed written consent was obtained from the patient after a thorough discussion of the procedural risks, benefits and alternatives. All questions were addressed. Maximal Sterile Barrier Technique was utilized including caps, mask, sterile gowns, sterile gloves, sterile drape, hand hygiene and skin antiseptic. A timeout was performed prior to the initiation of the procedure.  The right groin was prepped and draped in the usual sterile fashion. Thereafter using modified Seldinger technique, transfemoral access into the right common femoral artery was obtained without difficulty. Over a 0.035 inch guidewire, a 5 French Pinnacle sheath was inserted. Through this, and also over 0.035 inch guidewire, a 5 [place] JB 1 catheter was advanced to the aortic arch region and selectively positioned in the left common carotid artery.  FINDINGS: The left common carotid arteriogram demonstrates the left external carotid artery and its major branches to be widely patent.  The left internal carotid artery at the bulb has approximately 60% stenosis by the NASCET criteria. No evidence of acute ulcerations or  intraluminal filling defects is seen. More distally, the left internal carotid artery is seen to opacify normally to the cranial skull base. Wide patency is seen of the petrous, cavernous and supraclinoid  segments. Again demonstrated in the left posterior communicating artery region is a saccular outpouching projecting inferiorly and posteriorly which appears increased in size since the previous arteriograms as mentioned previously measuring approximately 3.7 mm x 2.5 mm with a wide neck. The left middle cerebral artery and the left anterior cerebral artery opacify into the capillary and venous phases.  ENDOVASCULAR EMBOLIZATION OF THE LEFT INTERNAL CAROTID ARTERY POSTERIOR COMMUNICATING ARTERY REGION ANEURYSM WITH PIPELINE FLOW DIVERTER  The diagnostic JB 1 catheter in the left common carotid artery was exchanged over a 0.035 inch 300 cm Rosen exchange guidewire for an 80 cm 8 [place] Neuron Max sheath. Guidewire was removed. Good aspiration obtained from the hub of the Neuron Max sheath. Gentle control arteriogram performed through the sheath demonstrated no change in the extracranial or intracranial left-sided circulation.  Over a 0.035 inch Roadrunner guidewire, using biplane roadmap technique and constant fluoroscopic guidance a 115 cm 5 [place] Catalyst guide catheter was then advanced without difficulty to the horizontal petrous segment of the left internal carotid artery. The guidewire was removed. Good aspiration obtained from the hub of the 5 [place] Catalyst guide catheter. Control arteriogram performed through this intracranially demonstrated no change in the left posterior communicating artery region aneurysm or of the middle or the anterior cerebral artery circulations.  Measurements were then performed of the left internal carotid artery distal landing zone which was just proximal to the left internal carotid artery terminus, and the left internal carotid artery just proximal to the caval segment.  Over a 0.014 inch standard Synchro micro guidewire with a J configuration, an 027 150 cm Phenom microcatheter was advanced out difficulty to the M2 M3 region of the codominant superior division of the left middle cerebral artery. The guidewire was removed. Good aspiration obtained from the hub of the microcatheter. Gentle control arteriogram performed through the  microcatheter demonstrated safe position of the tip of the microcatheter which was then connected to continuous heparinized saline infusion.  Having considerable measurements of the left internal carotid artery proximal and distal landing zone at the site of the left posterior communicating artery aneurysm, it was decided to proceed with embolization by placement of a 3.5 mm x 14 mm pipeline flex flow diverter device.  This was then advanced in a coaxial manner and with constant heparinized saline infusion to the distal end of the microcatheter.  The O ring on the delivery microcatheter was loosened. With slight forward traction with the right hand on the delivery micro guidewire, with the left hand the delivery microcatheter was retrieved unsheathing the distal wire and then a few mm of the distal end of the device.  Once the cigar configuration was seen to flare, the combination was retrieved to the distal landing zone which was at the left internal carotid termination.  Once there, using the push and pull technique with advancement of the micro guidewire and fluffing technique, device was fully implanted with excellent coverage of the aneurysm distally and also proximally. The proximal portion of the device following deployment was seen to be fully apposed just distal to the caval cavernous segment.  No evidence of endovascular leak was noted angiographically.  The delivery micro guidewire was then captured by advancing the microcatheter and the tube removed. Control arteriograms performed at 15 and 30 minutes post deployment of the device  continued to demonstrate excellent flow through the device without evidence of intraluminal filling defects or of occlusions intracranially.  A final control arteriogram performed through the Neuron Max  sheath in the left common carotid artery following removal of the Catalyst guide catheter continued to demonstrate excellent flow through the extracranial and intracranial ICA and also the left middle and the left anterior cerebral arteries. Stasis of contrast was seen in the aneurysm treated. No evidence of intra device irregularities to suggest platelet aggregation was noted.  The Neuron Max sheath was then removed following hemostasis in the right groin manually. The right groin appeared soft without evidence of hematoma or bleeding. Distal pulses remained Dopplerable in the dorsalis pedis, and the posterior tibial regions bilaterally unchanged.  Throughout the procedure, the patient's ACT was maintained in the region of approximately 180 seconds. The patient was also given a total of 3 mg of intra-arterial Integrilin to prevent platelet aggregation within the device itself.  The patient's general anesthesia was then reversed and the patient was extubated without difficulty. Upon recovery, the patient denied any headaches, nausea, vomiting, visual, motor, sensory abnormalities. She was then transferred to the neuro ICU to continue on low-dose IV heparin, close monitoring of her blood pressure and neurological examination. Toward the end of the day, the patient was fully alert, awake and able to tolerate free liquids without difficulty. Overnight her stay was unremarkable. The following morning, the IV heparin was stopped and the patient switched to aspirin 325 mg a day, and Plavix 75 mg a day.  The right groin appeared soft with distal pulses Dopplerable and unchanged. She was then ambulated independently prior to her discharge home to her husband. She was advised to continue taking her dual antiplatelets and  home medications. She was advised to maintain adequate hydration and refrain from smoking. She was advised to refrain from stooping, bending or lifting weights above 10 pounds for 2 weeks. Should she develop any neurological symptoms or signs the patient was advised to call 911.  The patient expressed understanding and agreement with the above management plan.  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  PLAN: Return to clinic for follow-up in approximately 2 weeks.   Electronically Signed   By: Luanne Bras M.D.   On: 04/16/2019 13:57   IR ANGIO INTRA EXTRACRAN SEL INTERNAL CAROTID UNI L MOD SED  Result Date: 04/15/2019 CLINICAL DATA:  Enlarging left posterior communicating artery region aneurysm on recent diagnostic arteriogram.  EXAM: TRANSCATHETER THERAPY EMBOLIZATION  COMPARISON:  Diagnostic arteriogram of March 19, 2019 and January 05, 2017.  MEDICATIONS: Heparin 3,000 units IV; Ancef 2 g IV antibiotic was administered within 1 hour of the procedure.  ANESTHESIA/SEDATION: General anesthesia.  CONTRAST:  Isovue 300 approximately 60 mL.  FLUOROSCOPY TIME:  Fluoroscopy Time: 35 minutes 0 seconds (1132 mGy).  COMPLICATIONS: None immediate.  TECHNIQUE: Informed written consent was obtained from the patient after a thorough discussion of the procedural risks, benefits and alternatives. All questions were addressed. Maximal Sterile Barrier Technique was utilized including caps, mask, sterile gowns, sterile gloves, sterile drape, hand hygiene and skin antiseptic. A timeout was performed prior to the initiation of the procedure.  The right groin was prepped and draped in the usual sterile fashion. Thereafter using modified Seldinger technique, transfemoral access into the right common femoral artery was obtained without difficulty. Over a 0.035  inch guidewire, a 5 French Pinnacle sheath was inserted. Through this, and also over 0.035 inch guidewire, a 5 [place] JB 1 catheter was advanced to the aortic arch region and selectively positioned in  the left common carotid artery.  FINDINGS: The left common carotid arteriogram demonstrates the left external carotid artery and its major branches to be widely patent.  The left internal carotid artery at the bulb has approximately 60% stenosis by the NASCET criteria. No evidence of acute ulcerations or intraluminal filling defects is seen. More distally, the left internal carotid artery is seen to opacify normally to the cranial skull base. Wide patency is seen of the petrous, cavernous and supraclinoid segments. Again demonstrated in the left posterior communicating artery region is a saccular outpouching projecting inferiorly and posteriorly which appears increased in size since the previous arteriograms as mentioned previously measuring approximately 3.7 mm x 2.5 mm with a wide neck. The left middle cerebral artery and the left anterior cerebral artery opacify into the capillary and venous phases.  ENDOVASCULAR EMBOLIZATION OF THE LEFT INTERNAL CAROTID ARTERY POSTERIOR COMMUNICATING ARTERY REGION ANEURYSM WITH PIPELINE FLOW DIVERTER  The diagnostic JB 1 catheter in the left common carotid artery was exchanged over a 0.035 inch 300 cm Rosen exchange guidewire for an 80 cm 8 [place] Neuron Max sheath. Guidewire was removed. Good aspiration obtained from the hub of the Neuron Max sheath. Gentle control arteriogram performed through the sheath demonstrated no change in the extracranial or intracranial left-sided circulation.  Over a 0.035 inch Roadrunner guidewire, using biplane roadmap technique and constant fluoroscopic guidance a 115 cm 5 [place] Catalyst guide catheter was then advanced without difficulty to the horizontal petrous segment of the left internal carotid artery. The guidewire was removed. Good  aspiration obtained from the hub of the 5 [place] Catalyst guide catheter. Control arteriogram performed through this intracranially demonstrated no change in the left posterior communicating artery region aneurysm or of the middle or the anterior cerebral artery circulations.  Measurements were then performed of the left internal carotid artery distal landing zone which was just proximal to the left internal carotid artery terminus, and the left internal carotid artery just proximal to the caval segment. Over a 0.014 inch standard Synchro micro guidewire with a J configuration, an 027 150 cm Phenom microcatheter was advanced out difficulty to the M2 M3 region of the codominant superior division of the left middle cerebral artery. The guidewire was removed. Good aspiration obtained from the hub of the microcatheter. Gentle control arteriogram performed through the  microcatheter demonstrated safe position of the tip of the microcatheter which was then connected to continuous heparinized saline infusion.  Having considerable measurements of the left internal carotid artery proximal and distal landing zone at the site of the left posterior communicating artery aneurysm, it was decided to proceed with embolization by placement of a 3.5 mm x 14 mm pipeline flex flow diverter device.  This was then advanced in a coaxial manner and with constant heparinized saline infusion to the distal end of the microcatheter.  The O ring on the delivery microcatheter was loosened. With slight forward traction with the right hand on the delivery micro guidewire, with the left hand the delivery microcatheter was retrieved unsheathing the distal wire and then a few mm of the distal end of the device.  Once the cigar configuration was seen to flare, the combination was retrieved to the distal landing zone which was at the left internal carotid termination.  Once there, using the push and pull technique with advancement of the micro  guidewire and fluffing technique, device was fully implanted with excellent coverage of the aneurysm distally and also proximally. The proximal portion of the device  following deployment was seen to be fully apposed just distal to the caval cavernous segment.  No evidence of endovascular leak was noted angiographically.  The delivery micro guidewire was then captured by advancing the microcatheter and the tube removed. Control arteriograms performed at 15 and 30 minutes post deployment of the device continued to demonstrate excellent flow through the device without evidence of intraluminal filling defects or of occlusions intracranially.  A final control arteriogram performed through the Neuron Max sheath in the left common carotid artery following removal of the Catalyst guide catheter continued to demonstrate excellent flow through the extracranial and intracranial ICA and also the left middle and the left anterior cerebral arteries. Stasis of contrast was seen in the aneurysm treated. No evidence of intra device irregularities to suggest platelet aggregation was noted.  The Neuron Max sheath was then removed following hemostasis in the right groin manually. The right groin appeared soft without evidence of hematoma or bleeding. Distal pulses remained Dopplerable in the dorsalis pedis, and the posterior tibial regions bilaterally unchanged.  Throughout the procedure, the patient's ACT was maintained in the region of approximately 180 seconds. The patient was also given a total of 3 mg of intra-arterial Integrilin to prevent platelet aggregation within the device itself.  The patient's general anesthesia was then reversed and the patient was extubated without difficulty. Upon recovery, the patient denied any headaches, nausea, vomiting, visual, motor, sensory abnormalities. She was then transferred to the neuro ICU to continue on low-dose IV heparin, close monitoring of her blood pressure and neurological  examination. Toward the end of the day, the patient was fully alert, awake and able to tolerate free liquids without difficulty. Overnight her stay was unremarkable. The following morning, the IV heparin was stopped and the patient switched to aspirin 325 mg a day, and Plavix 75 mg a day.  The right groin appeared soft with distal pulses Dopplerable and unchanged. She was then ambulated independently prior to her discharge home to her husband. She was advised to continue taking her dual antiplatelets and home medications. She was advised to maintain adequate hydration and refrain from smoking. She was advised to refrain from stooping, bending or lifting weights above 10 pounds for 2 weeks. Should she develop any neurological symptoms or signs the patient was advised to call 911.  The patient expressed understanding and agreement with the above management plan.  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  PLAN: Return to clinic for follow-up in approximately 2 weeks.   Electronically Signed   By: Luanne Bras M.D.   On: 04/16/2019 13:57    Labs:  CBC: Recent Labs    02/17/19 1154 03/19/19 0710 04/15/19 0715 04/16/19 0531  WBC 8.0 7.2 6.8 7.8  HGB 11.2* 12.3 12.0 9.4*  HCT 35.6* 38.6 37.3 28.8*  PLT 403* 423* 397 321    COAGS: Recent Labs    03/19/19 0710 04/15/19 0715 04/15/19 0730  INR 0.9  --  0.9  APTT  --  28  --     BMP: Recent Labs    02/17/19 1154 03/19/19 0710 04/15/19 0715 04/16/19 0531  NA 138 140 140 140  K 4.4 4.2 4.0 4.0  CL 106 108 106 109  CO2 22 23 23  20*  GLUCOSE 100* 104* 95 109*  BUN 14 12 13 10   CALCIUM 9.1 9.4 9.4 7.9*  CREATININE 0.70 0.80 0.96 0.75  GFRNONAA >60 >60 >60 >60  GFRAA >60 >60 >60 >60    LIVER FUNCTION TESTS: Recent Labs    10/23/18 0645 02/17/19 1154  BILITOT 0.8 0.5   AST 23 21  ALT 8 18  ALKPHOS 70 75  PROT 6.9 7.1  ALBUMIN 3.8 3.8     Assessment and Plan:  Left ICA/PCOM aneurysm s/p embolization using a pipeline flex flow diverter 04/15/2019 by Dr. Estanislado Pandy. Left ICA stenosis at the bulb. Dr. Estanislado Pandy was present for consultation. Discussed recent procedure. Explained that the best way to monitor her stent is with routine imaging scans to monitor for changes. Discussed known left ICA stenosis at the bulb. Explained the best way to monitor this stenosis is with routine imaging scans to monitor for changes.  Discussed blurred vision. Since bilateral, probably not related to stent. Recommend that patient sees her ophthalmologist for further evaluation. If blurred vision is unilateral, or if patient notices diplopia/curtain over eyes/compelte blindness, advised patient to call our office for further evaluation.  Discussed dyspnea and lightheadedness/dizziness when changing positions. Patient with known HF- states her EF is 36%. States her BP typically runs in the 100s/60s. ?possible worsening HF. Recommend that patient follow-up with her cardiologist for further evaluation of these symptoms.  Discussed tobacco use. Patient states that she has cut back to 1 cigarette per day. Strongly advised patient to discontinue tobacco use at this time.  Patient requesting return to work. At this time, patient is cleared to return to work without restrictions. Return to work letter given to patient.  Plan for follow-up with MRI brain (with and without contrast) and MRA head (without contrast) 4 months from procedure 04/15/2019, and with US carotids 6 months from procedure 04/15/2019. Informed patient that our schedulers will call her to set up these imaging scan. Instructed patient to continue taking Plavix 75 mg once daily and Aspirin 81 mg once daily- will obtain P2Y12 today to check effectiveness of DAPT. Instructed patient that if this result limits medications  adjustments, our office will call her to discuss- otherwise continue DAPT as above.  All questions answered and concerns addressed. Patient and husband convey understanding and agree with plan.  Thank you for this interesting consult.  I greatly enjoyed meeting [name] and look forward to participating in their care.  A copy of this report was sent to the requesting provider on this date.  Electronically Signed: Earley Abide, PA-C 05/08/2019, 8:34 AM   I spent a total of 40 Minutes in face to face in clinical consultation, greater than 50% of which was counseling/coordinating care for left ICA/PCOM aneurysm s/p embolization.

## 2019-05-08 NOTE — Telephone Encounter (Signed)
NIR.  P2Y12 today- 3 PRU. Discussed case with Dr. Estanislado Pandy who recommends discontinuing Plavix 75 mg once daily, begin taking Plavix 37.5 mg once daily, and continue taking Aspirin 81 mg once daily. Called patient's cell at [phone] and informed of above. All questions answered and concerns addressed. Patient conveys understanding and agrees with plan.   Bea Graff Margie Brink, PA-C 05/08/2019, 2:31 PM

## 2019-05-10 ENCOUNTER — Encounter (HOSPITAL_COMMUNITY): Payer: Self-pay

## 2019-05-10 ENCOUNTER — Other Ambulatory Visit: Payer: Self-pay

## 2019-05-10 ENCOUNTER — Emergency Department (HOSPITAL_COMMUNITY): Payer: PRIVATE HEALTH INSURANCE

## 2019-05-10 ENCOUNTER — Emergency Department (HOSPITAL_COMMUNITY)
Admission: EM | Admit: 2019-05-10 | Discharge: 2019-05-11 | Disposition: A | Discharge status: Home / Self Care | Payer: PRIVATE HEALTH INSURANCE | Attending: Emergency Medicine | Admitting: Emergency Medicine

## 2019-05-10 DIAGNOSIS — I509 Heart failure, unspecified: Secondary | ICD-10-CM | POA: Diagnosis not present

## 2019-05-10 DIAGNOSIS — R0602 Shortness of breath: Secondary | ICD-10-CM | POA: Diagnosis not present

## 2019-05-10 DIAGNOSIS — Z20822 Contact with and (suspected) exposure to covid-19: Secondary | ICD-10-CM | POA: Diagnosis not present

## 2019-05-10 DIAGNOSIS — C44391 Other specified malignant neoplasm of skin of nose: Secondary | ICD-10-CM | POA: Insufficient documentation

## 2019-05-10 DIAGNOSIS — R42 Dizziness and giddiness: Secondary | ICD-10-CM | POA: Diagnosis not present

## 2019-05-10 DIAGNOSIS — F17210 Nicotine dependence, cigarettes, uncomplicated: Secondary | ICD-10-CM | POA: Diagnosis not present

## 2019-05-10 DIAGNOSIS — I2510 Atherosclerotic heart disease of native coronary artery without angina pectoris: Secondary | ICD-10-CM | POA: Insufficient documentation

## 2019-05-10 DIAGNOSIS — R110 Nausea: Secondary | ICD-10-CM

## 2019-05-10 LAB — COMPREHENSIVE METABOLIC PANEL
ALT: 21 U/L (ref 0–44)
AST: 23 U/L (ref 15–41)
Albumin: 4.3 g/dL (ref 3.5–5.0)
Alkaline Phosphatase: 78 U/L (ref 38–126)
Anion gap: 11 (ref 5–15)
BUN: 19 mg/dL (ref 6–20)
CO2: 22 mmol/L (ref 22–32)
Calcium: 9.2 mg/dL (ref 8.9–10.3)
Chloride: 103 mmol/L (ref 98–111)
Creatinine, Ser: 0.92 mg/dL (ref 0.44–1.00)
GFR calc Af Amer: >60 mL/min (ref >60)
GFR calc non Af Amer: >60 mL/min (ref >60)
Glucose, Bld: 101 mg/dL — ABNORMAL HIGH (ref 70–99)
Potassium: 4 mmol/L (ref 3.5–5.1)
Sodium: 136 mmol/L (ref 135–145)
Total Bilirubin: 0.8 mg/dL (ref 0.3–1.2)
Total Protein: 7.8 g/dL (ref 6.5–8.1)

## 2019-05-10 LAB — CBC
HCT: 38.5 % (ref 36.0–46.0)
Hemoglobin: 12.4 g/dL (ref 12.0–15.0)
MCH: 28.6 pg (ref 26.0–34.0)
MCHC: 32.2 g/dL (ref 30.0–36.0)
MCV: 88.9 fL (ref 80.0–100.0)
Platelets: 434 10*3/uL — ABNORMAL HIGH (ref 150–400)
RBC: 4.33 MIL/uL (ref 3.87–5.11)
RDW: 15.6 % — ABNORMAL HIGH (ref 11.5–15.5)
WBC: 9.6 10*3/uL (ref 4.0–10.5)
nRBC: 0 % (ref 0.0–0.2)

## 2019-05-10 LAB — POC SARS CORONAVIRUS 2 AG -  ED: SARS Coronavirus 2 Ag: NEGATIVE

## 2019-05-10 LAB — LACTIC ACID, PLASMA: Lactic Acid, Venous: 1 mmol/L (ref 0.5–1.9)

## 2019-05-10 LAB — TROPONIN I (HIGH SENSITIVITY): Troponin I (High Sensitivity): 5 ng/L (ref <18)

## 2019-05-10 MED ORDER — SODIUM CHLORIDE 0.9 % IV BOLUS: 1000.0000 mL | Freq: Once | INTRAVENOUS | Status: DC

## 2019-05-10 MED ORDER — SODIUM CHLORIDE 0.9 % IV BOLUS
500.0000 mL | Freq: Once | INTRAVENOUS | Status: AC
  Administered 2019-05-10: 21:00:00 500 mL via INTRAVENOUS

## 2019-05-10 MED ORDER — IOHEXOL 350 MG/ML SOLN
100.0000 mL | Freq: Once | INTRAVENOUS | Status: AC | PRN
  Administered 2019-05-10: 100 mL via INTRAVENOUS

## 2019-05-10 MED ORDER — ONDANSETRON HCL 4 MG/2ML IJ SOLN
4.0000 mg | Freq: Once | INTRAMUSCULAR | Status: AC
  Administered 2019-05-10: 4 mg via INTRAVENOUS
  Filled 2019-05-10: qty 2

## 2019-05-10 NOTE — ED Provider Notes (Signed)
[facility] Emergency Department Provider Note MRN:  [id number]  Arrival date & time: 05/10/19     Chief Complaint   Dizziness (sob, hypotension, s/p surgery on12/11/2018)   History of Present Illness   Erin [name] is a 59 y.o. year-old female with a history of CHF, CAD, brain aneurysm status post stenting presenting to the ED with chief complaint of dizziness.  2 weeks of nausea, lightheadedness, dyspnea on exertion, abdominal discomfort, poor appetite.  Denies numbness or weakness to the arms or legs, no chest pain, no fever, no cough, no dysuria.  Symptoms constant, moderate, no exacerbating or alleviating factors.  Reports that her blood pressures have been low.  Review of Systems  A complete 10 system review of systems was obtained and all systems are negative except as noted in the HPI and PMH.   Patient's Health History    Past Medical History:  Diagnosis Date  . Anxiety state 03/25/2016  . Basal cell carcinoma of forehead   . CHF (congestive heart failure) ([place])   . Coronary artery disease    a. 03/11/16 PCI with DES-->Prox/Mid Cx;  b. 03/14/16 PCI with DES x2-->RCA, EF 30-35%.  . Essential hypertension   . GERD (gastroesophageal reflux disease)   . HFrEF (heart failure with reduced ejection fraction) ([place])    a. 10/2016 Echo: EF 35-40%, Gr1 DD, mild focal basal septal hypertrophy, basal inflat, mid inflat, basal antlat AK. Mid infept/inf/antlat, apical lateral sev HK. Mod MR. mildly reduced RV fxn. Mild TR.  [name] Kitchen History of pneumonia   . Hyperlipidemia   . IBS (irritable bowel syndrome)    diarrhea  . Ischemic cardiomyopathy    a. 10/2016 Echo: EF 35-40%, Gr1 DD.  [name] Kitchen Mitral regurgitation   . NSTEMI (non-ST elevated myocardial infarction) ([place]) 03/10/2016  . Pneumonia 03/2016  . Squamous cell cancer of skin of nose   . Thrombocytosis ([place]) 03/26/2016  . Tobacco abuse   . Trichimoniasis   . Wears dentures   . Wears glasses     Past Surgical  History:  Procedure Laterality Date  . APPENDECTOMY    . BIOPSY  09/20/2018   Procedure: BIOPSY;  Surgeon: Daneil Dolin, MD;  [facility];  Service: Endoscopy;;  colon  . CARDIAC CATHETERIZATION N/A 03/11/2016   Procedure: Left Heart Cath and Coronary Angiography;  Surgeon: Leonie Man, MD;  Location: [facility];  Service: Cardiovascular;  Laterality: N/A;  . CARDIAC CATHETERIZATION N/A 03/11/2016   Procedure: Coronary Stent Intervention;  Surgeon: Leonie Man, MD;  Location: [place] [facility];  Service: Cardiovascular;  Laterality: N/A;  . CARDIAC CATHETERIZATION N/A 03/14/2016   Procedure: Coronary Stent Intervention;  Surgeon: Peter M Martinique, MD;  Location: [place] [facility];  Service: Cardiovascular;  Laterality: N/A;  . CHOLECYSTECTOMY OPEN  1984  . COLONOSCOPY WITH PROPOFOL N/A 09/20/2018   Procedure: COLONOSCOPY WITH PROPOFOL;  Surgeon: Daneil Dolin, MD;  [facility];  Service: Endoscopy;  Laterality: N/A;  10:30am  . CORONARY ANGIOPLASTY WITH STENT PLACEMENT  03/14/2016  . ESOPHAGOGASTRODUODENOSCOPY (EGD) WITH PROPOFOL N/A 09/20/2018   Procedure: ESOPHAGOGASTRODUODENOSCOPY (EGD) WITH PROPOFOL;  Surgeon: Daneil Dolin, MD;  [facility];  Service: Endoscopy;  Laterality: N/A;  . FINGER ARTHROPLASTY Left 05/14/2013   Procedure: LEFT THUMB CARPAL METACARPAL ARTHROPLASTY;  Surgeon: Tennis Must, MD;  Location: [facility];  Service: Orthopedics;  Laterality: Left;  . IR ANGIO INTRA EXTRACRAN SEL COM  CAROTID INNOMINATE BILAT MOD SED  01/05/2017  . IR ANGIO INTRA EXTRACRAN SEL COM CAROTID INNOMINATE BILAT MOD SED  03/19/2019  . IR ANGIO INTRA EXTRACRAN SEL INTERNAL CAROTID UNI L MOD SED  04/15/2019  . IR ANGIO VERTEBRAL SEL VERTEBRAL BILAT MOD SED  01/05/2017  . IR ANGIO VERTEBRAL SEL VERTEBRAL BILAT MOD SED  03/19/2019  . IR ANGIOGRAM FOLLOW UP STUDY  04/15/2019  . IR RADIOLOGIST EVAL & MGMT  12/30/2016  . IR  TRANSCATH/EMBOLIZ  04/15/2019  . IR US GUIDE VASC ACCESS RIGHT  03/19/2019  . MALONEY DILATION N/A 09/20/2018   Procedure: Venia Minks DILATION;  Surgeon: Daneil Dolin, MD;  [facility];  Service: Endoscopy;  Laterality: N/A;  . RADIOLOGY WITH ANESTHESIA N/A 04/15/2019   Procedure: [employer];  Surgeon: Luanne Bras, MD;  Location: [facility];  Service: Radiology;  Laterality: N/A;  . TUBAL LIGATION  1987  . VAGINAL HYSTERECTOMY  2009    Family History  Problem Relation Age of Onset  . Diabetes Father   . Hypertension Father   . CAD Father   . Colon polyps Father 60       pre-cancerous   . Stroke Father   . Dementia Father   . Stroke Mother   . Hypertension Mother   . Diabetes Mother   . Heart failure Other   . Breast cancer Maternal Grandmother   . Colon cancer Neg Hx     Social History   Socioeconomic History  . Marital status: Married    Spouse name: Not on file  . Number of children: Not on file  . Years of education: Not on file  . Highest education level: Not on file  Occupational History  . Occupation: CNA  Tobacco Use  . Smoking status: Current Some Day Smoker    Packs/day: 0.10    Years: 15.00    Pack years: 1.50    Types: Cigarettes  . Smokeless tobacco: Never Used  . Tobacco comment: smokes a cigarette occasionally  Substance and Sexual Activity  . Alcohol use: Not Currently    Comment: occasionally  . Drug use: Not Currently    Types: Marijuana    Comment: former- 2017 last time  . Sexual activity: Not Currently    Birth control/protection: Surgical    Comment: hyst  Other Topics Concern  . Not on file  Social History Narrative   Lives with husband in [place] in a one story home with a basement.  Has 4 children.  Works as a Quarry manager.  Education: CNA school.    Social Determinants of Health   Financial Resource Strain:   . Difficulty of Paying Living Expenses: Not on file  Food Insecurity:   . Worried About Charity fundraiser in the Last  Year: Not on file  . Ran Out of Food in the Last Year: Not on file  Transportation Needs:   . Lack of Transportation (Medical): Not on file  . Lack of Transportation (Non-Medical): Not on file  Physical Activity:   . Days of Exercise per Week: Not on file  . Minutes of Exercise per Session: Not on file  Stress:   . Feeling of Stress : Not on file  Social Connections:   . Frequency of Communication with Friends and Family: Not on file  . Frequency of Social Gatherings with Friends and Family: Not on file  . Attends Religious Services: Not on file  . Active Member of Clubs or Organizations: Not on  file  . Attends Archivist Meetings: Not on file  . Marital Status: Not on file  Intimate Partner Violence:   . Fear of Current or Ex-Partner: Not on file  . Emotionally Abused: Not on file  . Physically Abused: Not on file  . Sexually Abused: Not on file     Physical Exam  Vital Signs and Nursing Notes reviewed Vitals:   05/10/19 2100 05/10/19 2130  BP: 114/78 115/69  Pulse: 73 74  Resp: 20 (!) 25  Temp:    SpO2: 100% 100%    CONSTITUTIONAL: Chronically ill-appearing, NAD NEURO:  Alert and oriented x 3, no focal deficits EYES:  eyes equal and reactive ENT/NECK:  no LAD, no JVD CARDIO: Regular rate, well-perfused, normal S1 and S2 PULM:  CTAB no wheezing or rhonchi GI/GU:  normal bowel sounds, non-distended, non-tender MSK/SPINE:  No gross deformities, no edema SKIN:  no rash, atraumatic PSYCH:  Appropriate speech and behavior  Diagnostic and Interventional Summary    EKG Interpretation  Date/Time:  Friday May 10 2019 20:47:33 EST Ventricular Rate:  78 PR Interval:    QRS Duration: 104 QT Interval:  414 QTC Calculation: 472 R Axis:   29 Text Interpretation: Sinus rhythm Low voltage, extremity leads Abnormal R-wave progression, early transition Baseline wander in lead(s) III Confirmed by Gerlene Fee [id number]) on 05/10/2019 8:51:16 PM      Labs Reviewed   CBC - Abnormal; Notable for the following components:      Result Value   RDW 15.6 (*)    Platelets 434 (*)    All other components within normal limits  COMPREHENSIVE METABOLIC PANEL - Abnormal; Notable for the following components:   Glucose, Bld 101 (*)    All other components within normal limits  LACTIC ACID, PLASMA  URINALYSIS, ROUTINE W REFLEX MICROSCOPIC  POC SARS CORONAVIRUS 2 AG -  ED  TROPONIN I (HIGH SENSITIVITY)  TROPONIN I (HIGH SENSITIVITY)    XR Chest Single View  Final Result    CT Head    (Results Pending)  CTA Chest for PE    (Results Pending)    Medications  ondansetron (ZOFRAN) injection 4 mg (4 mg Intravenous Given 05/10/19 2112)  sodium chloride 0.9 % bolus 500 mL (0 mLs Intravenous Stopped 05/10/19 2219)  iohexol (OMNIPAQUE) 350 MG/ML injection 100 mL (100 mLs Intravenous Contrast Given 05/10/19 2252)     Procedures  /  Critical Care Procedures  ED Course and Medical Decision Making  I have reviewed the triage vital signs and the nursing notes.  Pertinent labs & imaging results that were available during my care of the patient were reviewed by me and considered in my medical decision making (see below for details).     Suspect postoperative dehydration given her lightheadedness, poor p.o. intake, nausea.  Also considering postoperative pneumonia, UTI, metabolic disarray, [id number], pulmonary embolism also considered.  Awaiting initial laboratory evaluation and chest x-ray.  Patient feeling somewhat better after IV fluids, laboratory assessment does not provide reason for patient's symptoms of lightheadedness, nausea, DOE.  Will obtain CT head and CTA chest to exclude postoperative pulmonary embolism.  With negative work-up, patient is a candidate for discharge if she is able to ambulate.  Signed out to oncoming provider at shift change.  Barth Kirks. [place]onia Small, [place] [email or web address]  Final Clinical  Impressions(s) / ED Diagnoses     ICD-10-CM   1. SOB (shortness of breath)  R06.02 XR Chest  Single View    XR Chest Single View    ED Discharge Orders    None       Discharge Instructions Discussed with and Provided to Patient:   Discharge Instructions   None       Maudie Flakes, MD 05/10/19 2306

## 2019-05-10 NOTE — ED Notes (Signed)
Pt reports dizzy for last 2 weeks after brain procedure at [facility]  Reports she has spoken to her physician,  Dr Maudie Mercury who suggested she "get an appointment with my cardiologist"  She was at [facility] today and became increasingly dizzy and is here for eval of her dizziness

## 2019-05-10 NOTE — ED Triage Notes (Signed)
Pt had brain surgery on 04/15/2019 (stent placed in front of aneurysm to cut off blood supply per pt report) Pt reports dizziness, sob, and hypotension since having this done. Pt had f/u appt with surgeon one week ago and everything checked out ok. Pt says the surgeon told her he did not think that these sx were related to the surgery and to f/u with cardiologist- pt has appt with cardiologist on 05/20/19.

## 2019-05-10 NOTE — ED Notes (Signed)
Report to Jeffrey , RN 

## 2019-05-10 NOTE — ED Notes (Signed)
Rad to room 

## 2019-05-11 LAB — URINALYSIS, ROUTINE W REFLEX MICROSCOPIC
Bilirubin Urine: NEGATIVE
Glucose, UA: NEGATIVE mg/dL
Hgb urine dipstick: NEGATIVE
Ketones, ur: 5 mg/dL — AB
Leukocytes,Ua: NEGATIVE
Nitrite: NEGATIVE
Protein, ur: NEGATIVE mg/dL
Specific Gravity, Urine: >1.046 — ABNORMAL HIGH (ref 1.005–1.030)
pH: 5 (ref 5.0–8.0)

## 2019-05-11 LAB — TROPONIN I (HIGH SENSITIVITY): Troponin I (High Sensitivity): 5 ng/L (ref <18)

## 2019-05-11 MED ORDER — ONDANSETRON HCL 4 MG PO TABS: 4.0000 mg | ORAL_TABLET | Freq: Three times a day (TID) | ORAL | 0 refills | Status: DC | PRN

## 2019-05-11 NOTE — ED Notes (Signed)
ED Provider at bedside. 

## 2019-05-11 NOTE — ED Notes (Signed)
Pt ambulated to restroom on Pulse Ox. Pt maintained O2 Sats at 97% Room Air.

## 2019-05-15 NOTE — Progress Notes (Signed)
Cardiology Office Note    Date:  05/20/2019   ID:  Erin [name], Erin [name] [birth year], MRN [id number]  PCP:  [name], MD  Cardiologist: Kate Sable, MD EPS: None  No chief complaint on file.   History of Present Illness:  Erin [name] is a 59 y.o. female with history of CAD status post DES to the circumflex with staged PCI/DES x2 to the RCA/PDA 03/2016, NST 03/2017 large scar with minimal peri-infarct ischemia, chronic combined systolic and diastolic CHF, hypertension, HLD  NST 03/07/19 showed large prior inferior/inferior lateral/lateral MI with mild peri-infarct ischemia and severe decrease LVEF.  Echo 03/07/2019 moderate reduced LV systolic function EF 35 to 40% with grade 2 DD, carotid Dopplers 02/18/2019 60 to 79% left ICA stenosis.  Patient last saw Dr. Bronson Ing 04/12/2019 and was doing well and on Plavix for undergoing left PCOM aneurysm intervention 04/15/2019.  She was in the ER 05/10/2019 with 2 weeks of nausea lightheadedness dyspnea on exertion abdominal discomfort and poor appetite felt to be postop dehydration treated with IV fluids.  Was negative for PE, CT head without acute abnormality.  Patient comes in for f/u. Still no appetite but has gained some weight so wondering if it's fluid.Weight stable on home scales. BP has stabilized. Says she doesn't feel like she can get a deep breath. Does have a lot of anxiety. 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 Xanax doesn't seem to be working. She did get the first vaccine last week. Walks 4 miles daily in addition to work.  Past Medical History:  Diagnosis Date  . Anxiety state 03/25/2016  . Basal cell carcinoma of forehead   . CHF (congestive heart failure) ([place])   . Coronary artery disease    a. 03/11/16 PCI with DES-->Prox/Mid Cx;  b. 03/14/16 PCI with DES x2-->RCA, EF 30-35%.  . Essential hypertension   . GERD (gastroesophageal reflux disease)   . HFrEF (heart failure with reduced  ejection fraction) ([place])    a. 10/2016 Echo: EF 35-40%, Gr1 DD, mild focal basal septal hypertrophy, basal inflat, mid inflat, basal antlat AK. Mid infept/inf/antlat, apical lateral sev HK. Mod MR. mildly reduced RV fxn. Mild TR.  [name] Kitchen History of pneumonia   . Hyperlipidemia   . IBS (irritable bowel syndrome)    diarrhea  . Ischemic cardiomyopathy    a. 10/2016 Echo: EF 35-40%, Gr1 DD.  [name] Kitchen Mitral regurgitation   . NSTEMI (non-ST elevated myocardial infarction) ([place]) 03/10/2016  . Pneumonia 03/2016  . Squamous cell cancer of skin of nose   . Thrombocytosis ([place]) 03/26/2016  . Tobacco abuse   . Trichimoniasis   . Wears dentures   . Wears glasses     Past Surgical History:  Procedure Laterality Date  . APPENDECTOMY    . BIOPSY  09/20/2018   Procedure: BIOPSY;  Surgeon: Daneil Dolin, MD;  [facility];  Service: Endoscopy;;  colon  . CARDIAC CATHETERIZATION N/A 03/11/2016   Procedure: Left Heart Cath and Coronary Angiography;  Surgeon: Leonie Man, MD;  Location: [facility];  Service: Cardiovascular;  Laterality: N/A;  . CARDIAC CATHETERIZATION N/A 03/11/2016   Procedure: Coronary Stent Intervention;  Surgeon: Leonie Man, MD;  Location: [place];  Service: Cardiovascular;  Laterality: N/A;  . CARDIAC CATHETERIZATION N/A 03/14/2016   Procedure: Coronary Stent Intervention;  Surgeon: Peter M Martinique, MD;  Location: [place] [facility];  Service: Cardiovascular;  Laterality: N/A;  .  CHOLECYSTECTOMY OPEN  1984  . COLONOSCOPY WITH PROPOFOL N/A 09/20/2018   Procedure: COLONOSCOPY WITH PROPOFOL;  Surgeon: Daneil Dolin, MD;  [facility];  Service: Endoscopy;  Laterality: N/A;  10:30am  . CORONARY ANGIOPLASTY WITH STENT PLACEMENT  03/14/2016  . ESOPHAGOGASTRODUODENOSCOPY (EGD) WITH PROPOFOL N/A 09/20/2018   Procedure: ESOPHAGOGASTRODUODENOSCOPY (EGD) WITH PROPOFOL;  Surgeon: Daneil Dolin, MD;  [facility];  Service: Endoscopy;   Laterality: N/A;  . FINGER ARTHROPLASTY Left 05/14/2013   Procedure: LEFT THUMB CARPAL METACARPAL ARTHROPLASTY;  Surgeon: Tennis Must, MD;  Location: [facility];  Service: Orthopedics;  Laterality: Left;  . IR ANGIO INTRA EXTRACRAN SEL COM CAROTID INNOMINATE BILAT MOD SED  01/05/2017  . IR ANGIO INTRA EXTRACRAN SEL COM CAROTID INNOMINATE BILAT MOD SED  03/19/2019  . IR ANGIO INTRA EXTRACRAN SEL INTERNAL CAROTID UNI L MOD SED  04/15/2019  . IR ANGIO VERTEBRAL SEL VERTEBRAL BILAT MOD SED  01/05/2017  . IR ANGIO VERTEBRAL SEL VERTEBRAL BILAT MOD SED  03/19/2019  . IR ANGIOGRAM FOLLOW UP STUDY  04/15/2019  . IR RADIOLOGIST EVAL & MGMT  12/30/2016  . IR TRANSCATH/EMBOLIZ  04/15/2019  . IR US GUIDE VASC ACCESS RIGHT  03/19/2019  . MALONEY DILATION N/A 09/20/2018   Procedure: Venia Minks DILATION;  Surgeon: Daneil Dolin, MD;  [facility];  Service: Endoscopy;  Laterality: N/A;  . RADIOLOGY WITH ANESTHESIA N/A 04/15/2019   Procedure: [employer];  Surgeon: Luanne Bras, MD;  Location: [facility];  Service: Radiology;  Laterality: N/A;  . TUBAL LIGATION  1987  . VAGINAL HYSTERECTOMY  2009    Current Medications: Current Meds  Medication Sig  . acetaminophen (TYLENOL) 325 MG tablet Take 325 mg by mouth every 6 (six) hours as needed for mild pain or headache.   . ALPRAZolam (XANAX) 1 MG tablet Take 1 mg by mouth at bedtime.   [name] Kitchen aspirin EC 81 MG tablet Take 81 mg by mouth every evening.   [name] Kitchen atorvastatin (LIPITOR) 80 MG tablet TAKE 1 TABLET BY MOUTH ONCE DAILY AT  6  IN  THE  EVENING (Patient taking differently: Take 80 mg by mouth daily at 6 PM. )  . calcium carbonate (TUMS - DOSED IN MG ELEMENTAL CALCIUM) 500 MG chewable tablet Chew 1 tablet by mouth 3 times/day as needed-between meals & bedtime for indigestion or heartburn.  . clopidogrel (PLAVIX) 75 MG tablet Take 75 mg by mouth daily.  [name] Kitchen ezetimibe (ZETIA) 10 MG tablet Take 1 tablet by mouth once daily  . furosemide (LASIX) 20  MG tablet Take 1 tablet by mouth once daily  . gabapentin (NEURONTIN) 100 MG capsule Take 100 mg by mouth 3 (three) times daily.  [name] Kitchen loperamide (IMODIUM) 2 MG capsule Take 2 mg by mouth 4 (four) times daily as needed for diarrhea or loose stools (ibs).  . LORazepam (ATIVAN) 1 MG tablet Take 1 mg by mouth daily as needed for anxiety.   . metoprolol tartrate (LOPRESSOR) 25 MG tablet Take 0.5 tablets (12.5 mg total) by mouth 2 (two) times daily.  . Multiple Vitamins-Minerals (MULTIVITAMIN WITH MINERALS) tablet Take 1 tablet by mouth daily.  . nicotine (NICODERM CQ - DOSED IN MG/24 HOURS) 21 mg/24hr patch Place 21 mg onto the skin daily.  . nitroGLYCERIN (NITROSTAT) 0.4 MG SL tablet Place 1 tablet (0.4 mg total) under the tongue every 5 (five) minutes x 3 doses as needed for chest pain.  [name] Kitchen ondansetron (ZOFRAN ODT) 4 MG  disintegrating tablet Take 1 tablet (4 mg total) by mouth every 8 (eight) hours as needed.  . ondansetron (ZOFRAN) 4 MG tablet Take 1 tablet (4 mg total) by mouth every 8 (eight) hours as needed for nausea or vomiting.  . potassium chloride SA (KLOR-CON) 20 MEQ tablet Take 1 tablet by mouth once daily  . rOPINIRole (REQUIP) 0.5 MG tablet Take 0.5 mg by mouth at bedtime.   . sacubitril-valsartan (ENTRESTO) 24-26 MG Take 1 tablet by mouth 2 (two) times daily.     Allergies:   Tape   Social History   Socioeconomic History  . Marital status: Married    Spouse name: Not on file  . Number of children: Not on file  . Years of education: Not on file  . Highest education level: Not on file  Occupational History  . Occupation: CNA  Tobacco Use  . Smoking status: Current Some Day Smoker    Packs/day: 0.10    Years: 15.00    Pack years: 1.50    Types: Cigarettes  . Smokeless tobacco: Never Used  . Tobacco comment: smokes a cigarette occasionally  Substance and Sexual Activity  . Alcohol use: Not Currently    Comment: occasionally  . Drug use: Not Currently    Types: Marijuana     Comment: former- 2017 last time  . Sexual activity: Not Currently    Birth control/protection: Surgical    Comment: hyst  Other Topics Concern  . Not on file  Social History Narrative   Lives with husband in [place] in a one story home with a basement.  Has 4 children.  Works as a Quarry manager.  Education: CNA school.    Social Determinants of Health   Financial Resource Strain:   . Difficulty of Paying Living Expenses: Not on file  Food Insecurity:   . Worried About Charity fundraiser in the Last Year: Not on file  . Ran Out of Food in the Last Year: Not on file  Transportation Needs:   . Lack of Transportation (Medical): Not on file  . Lack of Transportation (Non-Medical): Not on file  Physical Activity:   . Days of Exercise per Week: Not on file  . Minutes of Exercise per Session: Not on file  Stress:   . Feeling of Stress : Not on file  Social Connections:   . Frequency of Communication with Friends and Family: Not on file  . Frequency of Social Gatherings with Friends and Family: Not on file  . Attends Religious Services: Not on file  . Active Member of Clubs or Organizations: Not on file  . Attends Archivist Meetings: Not on file  . Marital Status: Not on file     Family History:  The patient's   family history includes Breast cancer in her maternal grandmother; CAD in her father; Colon polyps (age of onset: 77) in her father; Dementia in her father; Diabetes in her father and mother; Heart failure in an other family member; Hypertension in her father and mother; Stroke in her father and mother.   ROS:   Please see the history of present illness.    ROS All other systems reviewed and are negative.   PHYSICAL EXAM:   VS:  BP 117/80   Pulse 75   Temp (!) 96.2 F (35.7 C)   Ht 5' (1.524 m)   Wt 131 lb (59.4 kg)   SpO2 100%   BMI 25.58 kg/m   Physical Exam  GEN:  Well nourished, well developed, in no acute distress  Neck: no JVD, carotid bruits, or  masses Cardiac:RRR; positive S4, 1/6 systolic murmur left sternal border Respiratory:  clear to auscultation bilaterally, normal work of breathing GI: soft, nontender, nondistended, + BS Ext: without cyanosis, clubbing, or edema, Good distal pulses bilaterally Neuro:  Alert and Oriented x 3 Psych: euthymic mood, full affect  Wt Readings from Last 3 Encounters:  05/20/19 131 lb (59.4 kg)  05/10/19 126 lb (57.2 kg)  04/15/19 132 lb 7.9 oz (60.1 kg)      Studies/Labs Reviewed:   EKG:  EKG is not ordered today.     Recent Labs: 10/23/2018: B Natriuretic Peptide 119.0 05/10/2019: ALT 21; BUN 19; Creatinine, Ser 0.92; Hemoglobin 12.4; Platelets 434; Potassium 4.0; Sodium 136   Lipid Panel    Component Value Date/Time   CHOL 179 03/19/2017 0643   TRIG 245 (H) 03/19/2017 0643   HDL 49 03/19/2017 0643   CHOLHDL 3.7 03/19/2017 0643   VLDL 49 (H) 03/19/2017 0643   LDLCALC 81 03/19/2017 0643    Additional studies/ records that were reviewed today include:    Echo 03/07/19:    1. Left ventricular ejection fraction, by visual estimation, is 35 to 40%. The left ventricle has moderately decreased function. There is mildly increased left ventricular hypertrophy.  2. Basal and mid inferolateral wall, mid anterolateral segment, and mid inferior segment are abnormal.  3. Elevated left ventricular end-diastolic pressure.  4. Left ventricular diastolic parameters are consistent with Grade II diastolic dysfunction (pseudonormalization).  5. Global right ventricle has normal systolic function.The right ventricular size is normal. No increase in right ventricular wall thickness.  6. Left atrial size was moderately dilated.  7. Right atrial size was normal.  8. Mild mitral annular calcification.  9. The mitral valve is degenerative. Mild mitral valve regurgitation. 10. The tricuspid valve is grossly normal. Tricuspid valve regurgitation is mild. 11. The aortic valve is tricuspid. Aortic valve  regurgitation is not visualized. No evidence of aortic valve sclerosis or stenosis. 12. The pulmonic valve was grossly normal. Pulmonic valve regurgitation is not visualized. 13. Normal pulmonary artery systolic pressure. 14. The inferior vena cava is normal in size with greater than 50% respiratory variability, suggesting right atrial pressure of 3 mmHg.     Stress test 03/07/19:    There was no ST segment deviation noted during stress.  Findings consistent with large prior inferior/inferolateral/lateral myocardial infarction with mild peri-infarct ischemia.  This is a high risk study. Risk based primarily on low LVEF and large scar, fairly mild current peri-infarct ischemia.  The left ventricular ejection fraction is severely decreased (<30%).          ASSESSMENT:    1. Coronary artery disease involving coronary bypass graft of native heart without angina pectoris   2. Ischemic cardiomyopathy   3. Essential hypertension   4. Hyperlipidemia, unspecified hyperlipidemia type   5. Stress      PLAN:  In order of problems listed above:  CAD status post DES to the circumflex with staged DES x2 to RCA and PDA 03/2016 NST 02/2019 demonstrated prior infarct with mild peri-infarct ischemia medical treatment recommended.  LVEF 35 to 40% on echo.  No angina  Ischemic cardiomyopathy with chronic combined systolic and diastolic CHF ejection fraction 35 to 40% on Lopressor and Entresto and Lasix.  Currently compensated.  We will repeat echo next month since it will be 3 months since Entresto restarted.  Follow-up with Dr. Bronson Ing in  April as scheduled.  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  She was vaccinated last week.  Asked her to follow-up with PCP.     Medication Adjustments/Labs and Tests Ordered: Current medicines are reviewed at length with the patient today.   Concerns regarding medicines are outlined above.  Medication changes, Labs and Tests ordered today are listed in the Patient Instructions below. Patient Instructions  Medication Instructions:  Your physician recommends that you continue on your current medications as directed. Please refer to the Current Medication list given to you today.  *If you need a refill on your cardiac medications before your next appointment, please call your pharmacy*  Lab Work: NONE If you have labs (blood work) drawn today and your tests are completely normal, you will receive your results only by: [name] Kitchen MyChart Message (if you have MyChart) OR . A paper copy in the mail If you have any lab test that is abnormal or we need to change your treatment, we will call you to review the results.  Testing/Procedures: Your physician has requested that you have an echocardiogram next month. Echocardiography is a painless test that uses sound waves to create images of your heart. It provides your doctor with information about the size and shape of your heart and how well your heart's chambers and valves are working. This procedure takes approximately one hour. There are no restrictions for this procedure.    Follow-Up: At [facility], you and your health needs are our priority.  As part of our continuing mission to provide you with exceptional heart care, we have created designated Provider Care Teams.  These Care Teams include your primary Cardiologist (physician) and Advanced Practice Providers (APPs -  Physician Assistants and Nurse Practitioners) who all work together to provide you with the care you need, when you need it.  Your next appointment:   Keep your apt already scheduled with Dr.Konezswaran in April   Thank you for choosing [facility] !           Sumner Boast, PA-C  05/20/2019 12:59 PM    [facility] [address], [place], [place]   [address] Phone: [phone]; Fax: [phone]

## 2019-05-20 ENCOUNTER — Encounter: Payer: Self-pay | Admitting: Physician Assistant

## 2019-05-20 ENCOUNTER — Ambulatory Visit (INDEPENDENT_AMBULATORY_CARE_PROVIDER_SITE_OTHER): Payer: No Typology Code available for payment source | Admitting: Physician Assistant

## 2019-05-20 VITALS — BP 117/80 | HR 75 | Temp 96.2°F | Ht 60.0 in | Wt 131.0 lb

## 2019-05-20 DIAGNOSIS — I10 Essential (primary) hypertension: Secondary | ICD-10-CM

## 2019-05-20 DIAGNOSIS — I255 Ischemic cardiomyopathy: Secondary | ICD-10-CM | POA: Diagnosis not present

## 2019-05-20 DIAGNOSIS — I25810 Atherosclerosis of coronary artery bypass graft(s) without angina pectoris: Secondary | ICD-10-CM | POA: Diagnosis not present

## 2019-05-20 DIAGNOSIS — E785 Hyperlipidemia, unspecified: Secondary | ICD-10-CM | POA: Diagnosis not present

## 2019-05-20 DIAGNOSIS — F439 Reaction to severe stress, unspecified: Secondary | ICD-10-CM

## 2019-05-20 NOTE — Patient Instructions (Signed)
Medication Instructions:  Your physician recommends that you continue on your current medications as directed. Please refer to the Current Medication list given to you today.  *If you need a refill on your cardiac medications before your next appointment, please call your pharmacy*  Lab Work: NONE If you have labs (blood work) drawn today and your tests are completely normal, you will receive your results only by: [name] Kitchen MyChart Message (if you have MyChart) OR . A paper copy in the mail If you have any lab test that is abnormal or we need to change your treatment, we will call you to review the results.  Testing/Procedures: Your physician has requested that you have an echocardiogram next month. Echocardiography is a painless test that uses sound waves to create images of your heart. It provides your doctor with information about the size and shape of your heart and how well your heart's chambers and valves are working. This procedure takes approximately one hour. There are no restrictions for this procedure.    Follow-Up: At [facility], you and your health needs are our priority.  As part of our continuing mission to provide you with exceptional heart care, we have created designated Provider Care Teams.  These Care Teams include your primary Cardiologist (physician) and Advanced Practice Providers (APPs -  Physician Assistants and Nurse Practitioners) who all work together to provide you with the care you need, when you need it.  Your next appointment:   Keep your apt already scheduled with Dr.Konezswaran in April   Thank you for choosing [facility] !

## 2019-05-24 ENCOUNTER — Ambulatory Visit (HOSPITAL_COMMUNITY)
Admission: RE | Admit: 2019-05-24 | Discharge: 2019-05-24 | Disposition: A | Discharge status: Home / Self Care | Payer: No Typology Code available for payment source | Source: Ambulatory Visit | Attending: Physician Assistant | Admitting: Physician Assistant

## 2019-05-24 ENCOUNTER — Other Ambulatory Visit: Payer: Self-pay

## 2019-05-24 DIAGNOSIS — I255 Ischemic cardiomyopathy: Secondary | ICD-10-CM | POA: Insufficient documentation

## 2019-05-24 NOTE — Progress Notes (Signed)
*  PRELIMINARY RESULTS* Echocardiogram 2D Echocardiogram has been performed.  Leavy Cella 05/24/2019, 10:20 AM

## 2019-05-27 ENCOUNTER — Telehealth: Payer: Self-pay | Admitting: *Deleted

## 2019-05-27 DIAGNOSIS — I5042 Chronic combined systolic (congestive) and diastolic (congestive) heart failure: Secondary | ICD-10-CM

## 2019-05-27 NOTE — Telephone Encounter (Signed)
-----   Message from Imogene Burn, PA-C sent at 05/27/2019  8:38 AM EST ----- Heart function has decrease some and is now 30-35%(was 35-40%) so very similar and minimal change but was hoping for improvement on Entresto. Ask her if she is interested in a referral to the advanced CHF clinic in [place] as they may have some other meds to recommend. Please place referral if she's interested. If not, keep f/u with Dr.K in April

## 2019-05-27 NOTE — Telephone Encounter (Signed)
-----   Message from Imogene Burn, PA-C sent at 05/27/2019  8:38 AM EST ----- Heart function has decrease some and is now 30-35%(was 35-40%) so very similar and minimal change but was hoping for improvement on Entresto. Ask her if she is interested in a referral to the advanced CHF clinic in [place] as they may have some other meds to recommend. Please place referral if she's interested. If not, keep f/u with Dr.K in April

## 2019-05-27 NOTE — Telephone Encounter (Signed)
Called patient with test results. No answer. Left message to call back.  

## 2019-05-27 NOTE — Telephone Encounter (Signed)
Pt.notified

## 2019-06-06 ENCOUNTER — Other Ambulatory Visit: Payer: Self-pay | Admitting: Cardiovascular Disease

## 2019-06-20 ENCOUNTER — Telehealth (HOSPITAL_COMMUNITY): Payer: Self-pay

## 2019-06-20 NOTE — Telephone Encounter (Signed)
Called pt to let her know that Deveshwar would like to do her mri sooner. I will send a request and call her once I get auth. AW

## 2019-06-20 NOTE — Telephone Encounter (Signed)
Pt called with sx of slight headache and frequent dizziness that can last up to 10 minutes sometimes. I have sent a message to our PA to review with Dr. Estanislado Pandy. Will call back. Pt agreed. AW

## 2019-06-24 ENCOUNTER — Other Ambulatory Visit: Payer: Self-pay | Admitting: *Deleted

## 2019-06-24 MED ORDER — CLOPIDOGREL BISULFATE 75 MG PO TABS: 75.0000 mg | ORAL_TABLET | Freq: Every day | ORAL | 0 refills | Status: DC

## 2019-06-25 ENCOUNTER — Other Ambulatory Visit: Payer: Self-pay

## 2019-06-25 ENCOUNTER — Emergency Department (HOSPITAL_COMMUNITY)
Admission: EM | Admit: 2019-06-25 | Discharge: 2019-06-25 | Disposition: A | Discharge status: Home / Self Care | Payer: PRIVATE HEALTH INSURANCE | Attending: Emergency Medicine | Admitting: Emergency Medicine

## 2019-06-25 ENCOUNTER — Emergency Department (HOSPITAL_COMMUNITY): Payer: PRIVATE HEALTH INSURANCE

## 2019-06-25 ENCOUNTER — Encounter (HOSPITAL_COMMUNITY): Payer: Self-pay | Admitting: *Deleted

## 2019-06-25 DIAGNOSIS — R072 Precordial pain: Secondary | ICD-10-CM | POA: Diagnosis not present

## 2019-06-25 DIAGNOSIS — R0789 Other chest pain: Secondary | ICD-10-CM | POA: Diagnosis present

## 2019-06-25 DIAGNOSIS — I5042 Chronic combined systolic (congestive) and diastolic (congestive) heart failure: Secondary | ICD-10-CM | POA: Diagnosis not present

## 2019-06-25 DIAGNOSIS — Z79899 Other long term (current) drug therapy: Secondary | ICD-10-CM | POA: Insufficient documentation

## 2019-06-25 DIAGNOSIS — Z20822 Contact with and (suspected) exposure to covid-19: Secondary | ICD-10-CM | POA: Diagnosis not present

## 2019-06-25 DIAGNOSIS — J189 Pneumonia, unspecified organism: Secondary | ICD-10-CM

## 2019-06-25 DIAGNOSIS — R519 Headache, unspecified: Secondary | ICD-10-CM

## 2019-06-25 DIAGNOSIS — I2510 Atherosclerotic heart disease of native coronary artery without angina pectoris: Secondary | ICD-10-CM | POA: Insufficient documentation

## 2019-06-25 DIAGNOSIS — I110 Hypertensive heart disease with heart failure: Secondary | ICD-10-CM | POA: Insufficient documentation

## 2019-06-25 DIAGNOSIS — Z8679 Personal history of other diseases of the circulatory system: Secondary | ICD-10-CM | POA: Diagnosis not present

## 2019-06-25 DIAGNOSIS — F17210 Nicotine dependence, cigarettes, uncomplicated: Secondary | ICD-10-CM | POA: Diagnosis not present

## 2019-06-25 DIAGNOSIS — Z85828 Personal history of other malignant neoplasm of skin: Secondary | ICD-10-CM | POA: Diagnosis not present

## 2019-06-25 DIAGNOSIS — Z7982 Long term (current) use of aspirin: Secondary | ICD-10-CM | POA: Insufficient documentation

## 2019-06-25 DIAGNOSIS — Z7902 Long term (current) use of antithrombotics/antiplatelets: Secondary | ICD-10-CM | POA: Insufficient documentation

## 2019-06-25 LAB — CBC
HCT: 35 % — ABNORMAL LOW (ref 36.0–46.0)
Hemoglobin: 11.1 g/dL — ABNORMAL LOW (ref 12.0–15.0)
MCH: 28.5 pg (ref 26.0–34.0)
MCHC: 31.7 g/dL (ref 30.0–36.0)
MCV: 90 fL (ref 80.0–100.0)
Platelets: 403 10*3/uL — ABNORMAL HIGH (ref 150–400)
RBC: 3.89 MIL/uL (ref 3.87–5.11)
RDW: 15.6 % — ABNORMAL HIGH (ref 11.5–15.5)
WBC: 8.6 10*3/uL (ref 4.0–10.5)
nRBC: 0 % (ref 0.0–0.2)

## 2019-06-25 LAB — BASIC METABOLIC PANEL
Anion gap: 8 (ref 5–15)
BUN: 21 mg/dL — ABNORMAL HIGH (ref 6–20)
CO2: 24 mmol/L (ref 22–32)
Calcium: 9 mg/dL (ref 8.9–10.3)
Chloride: 104 mmol/L (ref 98–111)
Creatinine, Ser: 0.82 mg/dL (ref 0.44–1.00)
GFR calc Af Amer: >60 mL/min (ref >60)
GFR calc non Af Amer: >60 mL/min (ref >60)
Glucose, Bld: 101 mg/dL — ABNORMAL HIGH (ref 70–99)
Potassium: 3.7 mmol/L (ref 3.5–5.1)
Sodium: 136 mmol/L (ref 135–145)

## 2019-06-25 LAB — TROPONIN I (HIGH SENSITIVITY)
Troponin I (High Sensitivity): 6 ng/L (ref <18)
Troponin I (High Sensitivity): 5 ng/L (ref <18)

## 2019-06-25 LAB — BRAIN NATRIURETIC PEPTIDE: B Natriuretic Peptide: 80 pg/mL (ref 0.0–100.0)

## 2019-06-25 LAB — SARS CORONAVIRUS 2 (TAT 6-24 HRS): SARS Coronavirus 2: NEGATIVE

## 2019-06-25 LAB — POC SARS CORONAVIRUS 2 AG -  ED: SARS Coronavirus 2 Ag: NEGATIVE

## 2019-06-25 MED ORDER — DIPHENHYDRAMINE HCL 50 MG/ML IJ SOLN
12.5000 mg | Freq: Once | INTRAMUSCULAR | Status: AC
  Administered 2019-06-25: 12.5 mg via INTRAVENOUS
  Filled 2019-06-25: qty 1

## 2019-06-25 MED ORDER — FENTANYL CITRATE (PF) 100 MCG/2ML IJ SOLN
50.0000 ug | Freq: Once | INTRAMUSCULAR | Status: AC
  Administered 2019-06-25: 50 ug via INTRAVENOUS
  Filled 2019-06-25: qty 2

## 2019-06-25 MED ORDER — METOCLOPRAMIDE HCL 5 MG/ML IJ SOLN
5.0000 mg | Freq: Once | INTRAMUSCULAR | Status: AC
  Administered 2019-06-25: 5 mg via INTRAVENOUS
  Filled 2019-06-25: qty 2

## 2019-06-25 MED ORDER — SODIUM CHLORIDE 0.9 % IV SOLN
500.0000 mg | Freq: Once | INTRAVENOUS | Status: AC
  Administered 2019-06-25: 500 mg via INTRAVENOUS
  Filled 2019-06-25: qty 500

## 2019-06-25 MED ORDER — SODIUM CHLORIDE 0.9 % IV SOLN
1.0000 g | Freq: Once | INTRAVENOUS | Status: AC
  Administered 2019-06-25: 1 g via INTRAVENOUS
  Filled 2019-06-25: qty 10

## 2019-06-25 MED ORDER — DOXYCYCLINE HYCLATE 100 MG PO CAPS: 100.0000 mg | ORAL_CAPSULE | Freq: Two times a day (BID) | ORAL | 0 refills | Status: DC

## 2019-06-25 MED ORDER — SODIUM CHLORIDE 0.9% FLUSH
3.0000 mL | Freq: Once | INTRAVENOUS | Status: AC
  Administered 2019-06-25: 3 mL via INTRAVENOUS

## 2019-06-25 NOTE — ED Provider Notes (Signed)
[facility] EMERGENCY DEPARTMENT Provider Note   CSN: [id number] Arrival date & time: 06/25/19  0542   Time seen 6:00 AM  History Chief Complaint  Patient presents with  . Chest Pain    Erin [name] is a 59 y.o. female.  HPI Patient states she has a history of coronary artery disease and had an MI in 2017 however she did not have chest pain, she did have sweating and diaphoresis.  She states she has 2 stents.  She states that 3 AM she was sleeping and she was awakened with pain in her lower central chest that radiates up into her neck.  She describes it as a pressure or heaviness.  It has made her feel short of breath and diaphoretic with nausea but no vomiting.  She states that similar to pain she had before when she had pneumonia.  She states she has had a cough, nonproductive for 2 days.  She denies fever, sore throat, or rhinorrhea.  She does describe chills.  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  She states her legs were swelling yesterday and she took an extra Lasix and tried to elevate her legs.  She states when she went to bed she noted she needed an extra pillow to help her breathe better.  She did not try any medication tonight.  She states her current chest pain is a 6 out of 10, at its worst it was an 8 out of 10.  Patient states she also has a diffuse holocranial headache that is throbbing.  Patient states she had a intracranial aneurysm that was being followed by Dr. Estanislado Pandy and it finally started getting bigger and she had a stent placed on December 7.  Since that time she has been getting a lot of dizzy spells.  She states she has some stenosis in her left carotid arteries, and she feels like sometimes she does not get the blood flow to her brain that she should.  She states she is waiting for approval to get an MRI done.  She denies any numbness of her  extremities but states her feet feel like they have needles sticking in them.    PCP [name], MD Cardiology Dr [name]  Past Medical History:  Diagnosis Date  . Anxiety state 03/25/2016  . Basal cell carcinoma of forehead   . CHF (congestive heart failure) ([place])   . Coronary artery disease    a. 03/11/16 PCI with DES-->Prox/Mid Cx;  b. 03/14/16 PCI with DES x2-->RCA, EF 30-35%.  . Essential hypertension   . GERD (gastroesophageal reflux disease)   . HFrEF (heart failure with reduced ejection fraction) ([place])    a. 10/2016 Echo: EF 35-40%, Gr1 DD, mild focal basal septal hypertrophy, basal inflat, mid inflat, basal antlat AK. Mid infept/inf/antlat, apical lateral sev HK. Mod MR. mildly reduced RV fxn. Mild TR.  [name] Kitchen History of pneumonia   . Hyperlipidemia   . IBS (irritable bowel syndrome)    diarrhea  . Ischemic cardiomyopathy    a. 10/2016 Echo: EF 35-40%, Gr1 DD.  [name] Kitchen Mitral regurgitation   . NSTEMI (non-ST elevated myocardial infarction) ([place]) 03/10/2016  . Pneumonia 03/2016  . Squamous cell cancer of skin of nose   . Thrombocytosis ([place]) 03/26/2016  . Tobacco abuse   . Trichimoniasis   . Wears dentures   .  Wears glasses     Patient Active Problem List   Diagnosis Date Noted  . Brain aneurysm 04/15/2019  . Dysphagia 02/27/2018  . Encounter for screening colonoscopy 02/27/2018  . History of Clostridium difficile infection 02/27/2018  . Frequent stools 12/20/2017  . Chronic combined systolic and diastolic CHF (congestive heart failure) ([place]) 06/20/2016  . Hypokalemia   . Acute CHF (congestive heart failure) ([place]) 05/16/2016  . Acute on chronic systolic CHF (congestive heart failure) ([place]) 05/16/2016  . Acute respiratory failure with hypoxia ([place])   . Thrombocytosis ([place]) 03/26/2016  . Cardiomyopathy, ischemic 03/25/2016  . Chronic combined systolic and diastolic heart failure ([place]) 03/25/2016  . Anxiety state 03/25/2016  . Troponin level elevated 03/25/2016  . CAD  (coronary artery disease), native coronary artery 03/25/2016  . Normocytic anemia 03/25/2016  . SOB (shortness of breath) 03/24/2016  . Lightheadedness 03/17/2016  . Hypotension 03/17/2016  . Tobacco abuse 03/12/2016  . NSTEMI (non-ST elevated myocardial infarction) ([place]) 03/11/2016  . Pain in the chest   . Essential hypertension 09/06/2015  . Mixed hyperlipidemia 09/06/2015  . GERD (gastroesophageal reflux disease) 09/06/2015  . Chest pain 09/06/2015    Past Surgical History:  Procedure Laterality Date  . APPENDECTOMY    . BIOPSY  09/20/2018   Procedure: BIOPSY;  Surgeon: Daneil Dolin, MD;  [facility];  Service: Endoscopy;;  colon  . CARDIAC CATHETERIZATION N/A 03/11/2016   Procedure: Left Heart Cath and Coronary Angiography;  Surgeon: Leonie Man, MD;  Location: [place] CV LAB;  Service: Cardiovascular;  Laterality: N/A;  . CARDIAC CATHETERIZATION N/A 03/11/2016   Procedure: Coronary Stent Intervention;  Surgeon: Leonie Man, MD;  Location: [place] CV LAB;  Service: Cardiovascular;  Laterality: N/A;  . CARDIAC CATHETERIZATION N/A 03/14/2016   Procedure: Coronary Stent Intervention;  Surgeon: Peter M Martinique, MD;  Location: [place] CV LAB;  Service: Cardiovascular;  Laterality: N/A;  . CHOLECYSTECTOMY OPEN  1984  . COLONOSCOPY WITH PROPOFOL N/A 09/20/2018   Procedure: COLONOSCOPY WITH PROPOFOL;  Surgeon: Daneil Dolin, MD;  [facility];  Service: Endoscopy;  Laterality: N/A;  10:30am  . CORONARY ANGIOPLASTY WITH STENT PLACEMENT  03/14/2016  . ESOPHAGOGASTRODUODENOSCOPY (EGD) WITH PROPOFOL N/A 09/20/2018   Procedure: ESOPHAGOGASTRODUODENOSCOPY (EGD) WITH PROPOFOL;  Surgeon: Daneil Dolin, MD;  [facility];  Service: Endoscopy;  Laterality: N/A;  . FINGER ARTHROPLASTY Left 05/14/2013   Procedure: LEFT THUMB CARPAL METACARPAL ARTHROPLASTY;  Surgeon: Tennis Must, MD;  Location: [place];  Service: Orthopedics;   Laterality: Left;  . IR ANGIO INTRA EXTRACRAN SEL COM CAROTID INNOMINATE BILAT MOD SED  01/05/2017  . IR ANGIO INTRA EXTRACRAN SEL COM CAROTID INNOMINATE BILAT MOD SED  03/19/2019  . IR ANGIO INTRA EXTRACRAN SEL INTERNAL CAROTID UNI L MOD SED  04/15/2019  . IR ANGIO VERTEBRAL SEL VERTEBRAL BILAT MOD SED  01/05/2017  . IR ANGIO VERTEBRAL SEL VERTEBRAL BILAT MOD SED  03/19/2019  . IR ANGIOGRAM FOLLOW UP STUDY  04/15/2019  . IR RADIOLOGIST EVAL & MGMT  12/30/2016  . IR TRANSCATH/EMBOLIZ  04/15/2019  . IR US GUIDE VASC ACCESS RIGHT  03/19/2019  . MALONEY DILATION N/A 09/20/2018   Procedure: Venia Minks DILATION;  Surgeon: Daneil Dolin, MD;  [facility];  Service: Endoscopy;  Laterality: N/A;  . RADIOLOGY WITH ANESTHESIA N/A 04/15/2019   Procedure: [employer];  Surgeon: Luanne Bras, MD;  Location: [place];  Service: Radiology;  Laterality: N/A;  .  TUBAL LIGATION  1987  . VAGINAL HYSTERECTOMY  2009     OB History    Gravida  4   Para  4   Term  4   Preterm      AB      Living  4     SAB      TAB      Ectopic      Multiple      Live Births              Family History  Problem Relation Age of Onset  . Diabetes Father   . Hypertension Father   . CAD Father   . Colon polyps Father 60       pre-cancerous   . Stroke Father   . Dementia Father   . Stroke Mother   . Hypertension Mother   . Diabetes Mother   . Heart failure Other   . Breast cancer Maternal Grandmother   . Colon cancer Neg Hx     Social History   Tobacco Use  . Smoking status: Current Some Day Smoker    Packs/day: 0.10    Years: 15.00    Pack years: 1.50    Types: Cigarettes  . Smokeless tobacco: Never Used  . Tobacco comment: smokes a cigarette occasionally  Substance Use Topics  . Alcohol use: Not Currently    Comment: occasionally  . Drug use: Not Currently    Types: Marijuana    Comment: former- 2017 last time  employed Smokes b/o stress by her daughter who abuses  drugs  Home Medications Prior to Admission medications   Medication Sig Start Date End Date Taking? Authorizing Provider  acetaminophen (TYLENOL) 325 MG tablet Take 325 mg by mouth every 6 (six) hours as needed for mild pain or headache.     [provider]  ALPRAZolam Duanne Moron) 1 MG tablet Take 1 mg by mouth at bedtime.     [provider]  aspirin EC 81 MG tablet Take 81 mg by mouth every evening.     [provider]  atorvastatin (LIPITOR) 80 MG tablet TAKE 1 TABLET BY MOUTH ONCE DAILY AT  6  IN  THE  EVENING Patient taking differently: Take 80 mg by mouth daily at 6 PM.  05/08/19   Herminio Commons, MD  calcium carbonate (TUMS - DOSED IN MG ELEMENTAL CALCIUM) 500 MG chewable tablet Chew 1 tablet by mouth 3 times/day as needed-between meals & bedtime for indigestion or heartburn.    [provider]  clopidogrel (PLAVIX) 75 MG tablet Take 1 tablet (75 mg total) by mouth daily. 06/24/19   Herminio Commons, MD  ezetimibe (ZETIA) 10 MG tablet Take 1 tablet by mouth once daily 05/08/19   Herminio Commons, MD  furosemide (LASIX) 20 MG tablet Take 1 tablet by mouth once daily 06/06/19   Herminio Commons, MD  gabapentin (NEURONTIN) 100 MG capsule Take 100 mg by mouth 3 (three) times daily.    [provider]  loperamide (IMODIUM) 2 MG capsule Take 2 mg by mouth 4 (four) times daily as needed for diarrhea or loose stools (ibs).    [provider]  LORazepam (ATIVAN) 1 MG tablet Take 1 mg by mouth daily as needed for anxiety.  11/09/17   [provider]  metoprolol tartrate (LOPRESSOR) 25 MG tablet Take 0.5 tablets (12.5 mg total) by mouth 2 (two) times daily. 02/22/19 05/23/19  Herminio Commons, MD  Multiple  Vitamins-Minerals (MULTIVITAMIN WITH MINERALS) tablet Take 1 tablet by mouth daily.    [provider]  nicotine (NICODERM CQ - DOSED IN MG/24 HOURS) 21 mg/24hr patch Place 21 mg onto the skin daily.    [provider]  nitroGLYCERIN (NITROSTAT) 0.4 MG SL tablet Place 1 tablet (0.4 mg total) under the tongue every 5 (five) minutes x 3 doses as needed for chest pain. 03/05/18   Manuella Ghazi, Pratik D, DO  ondansetron (ZOFRAN ODT) 4 MG disintegrating tablet Take 1 tablet (4 mg total) by mouth every 8 (eight) hours as needed. 10/23/18   Fredia Sorrow, MD  ondansetron (ZOFRAN) 4 MG tablet Take 1 tablet (4 mg total) by mouth every 8 (eight) hours as needed for nausea or vomiting. 05/11/19   Mesner, Corene Cornea, MD  potassium chloride SA (KLOR-CON) 20 MEQ tablet Take 1 tablet by mouth once daily 04/10/19   Herminio Commons, MD  rOPINIRole (REQUIP) 0.5 MG tablet Take 0.5 mg by mouth at bedtime.  11/13/17   [provider]  sacubitril-valsartan (ENTRESTO) 24-26 MG Take 1 tablet by mouth 2 (two) times daily. 02/22/19   Herminio Commons, MD    Allergies    Tape  Review of Systems   Review of Systems  All other systems reviewed and are negative.   Physical Exam Updated Vital Signs BP 125/73 (BP Location: Left Arm)   Pulse 79   Temp 97.8 F (36.6 C) (Oral)   Resp 17   Ht 5' (1.524 m)   Wt 58.1 kg   SpO2 100%   BMI 25.00 kg/m   Vital signs normal    Physical Exam Vitals and nursing note reviewed.  Constitutional:      General: She is not in acute distress.    Appearance: Normal appearance. She is well-developed. She is not ill-appearing or toxic-appearing.  HENT:     Head: Normocephalic and atraumatic.     Right Ear: External ear normal.     Left Ear: External ear normal.     Nose: Nose normal. No mucosal edema or rhinorrhea.     Mouth/Throat:     Dentition: No dental abscesses.     Pharynx: No uvula swelling.  Eyes:     Conjunctiva/sclera: Conjunctivae normal.     Pupils: Pupils are equal, round, and reactive to light.  Neck:     Comments: No bruits heard Cardiovascular:     Rate and Rhythm: Normal rate and regular rhythm.     Heart sounds: Normal heart sounds. No murmur. No  friction rub. No gallop.   Pulmonary:     Effort: Pulmonary effort is normal. No respiratory distress.     Breath sounds: Normal breath sounds. No wheezing, rhonchi or rales.  Chest:     Chest wall: No tenderness or crepitus.       Comments: Area of pain noted Abdominal:     General: Bowel sounds are normal. There is no distension.     Palpations: Abdomen is soft.     Tenderness: There is no abdominal tenderness. There is no guarding or rebound.  Musculoskeletal:        General: No tenderness. Normal range of motion.     Cervical back: Full passive range of motion without pain, normal range of motion and neck supple.     Comments: Moves all extremities well.   Skin:    General: Skin is warm and dry.     Coloration: Skin is not pale.  Findings: No erythema or rash.  Neurological:     General: No focal deficit present.     Mental Status: She is alert and oriented to person, place, and time.     Cranial Nerves: No cranial nerve deficit.  Psychiatric:        Mood and Affect: Mood normal. Mood is not anxious.        Speech: Speech normal.        Behavior: Behavior normal.        Thought Content: Thought content normal.     ED Results / Procedures / Treatments   Labs (all labs ordered are listed, but only abnormal results are displayed) Results for orders placed or performed during the hospital encounter of 24/40/10  Basic metabolic panel  Result Value Ref Range   Sodium 136 135 - 145 mmol/L   Potassium 3.7 3.5 - 5.1 mmol/L   Chloride 104 98 - 111 mmol/L   CO2 24 22 - 32 mmol/L   Glucose, Bld 101 (H) 70 - 99 mg/dL   BUN 21 (H) 6 - 20 mg/dL   Creatinine, Ser 0.82 0.44 - 1.00 mg/dL   Calcium 9.0 8.9 - 10.3 mg/dL   GFR calc non Af Amer >60 >60 mL/min   GFR calc Af Amer >60 >60 mL/min   Anion gap 8 5 - 15  CBC  Result Value Ref Range   WBC 8.6 4.0 - 10.5 K/uL   RBC 3.89 3.87 - 5.11 MIL/uL   Hemoglobin 11.1 (L) 12.0 - 15.0 g/dL   HCT 35.0 (L) 36.0 - 46.0 %   MCV 90.0  80.0 - 100.0 fL   MCH 28.5 26.0 - 34.0 pg   MCHC 31.7 30.0 - 36.0 g/dL   RDW 15.6 (H) 11.5 - 15.5 %   Platelets 403 (H) 150 - 400 K/uL   nRBC 0.0 0.0 - 0.2 %  Brain natriuretic peptide  Result Value Ref Range   B Natriuretic Peptide 80.0 0.0 - 100.0 pg/mL  POC SARS Coronavirus 2 Ag-ED - Nasal Swab (BD Veritor Kit)  Result Value Ref Range   SARS Coronavirus 2 Ag NEGATIVE NEGATIVE  Troponin I (High Sensitivity)  Result Value Ref Range   Troponin I (High Sensitivity) 6 <18 ng/L   Laboratory interpretation all normal except mild anemia    EKG EKG Interpretation  Date/Time:  Tuesday June 25 2019 06:03:31 EST Ventricular Rate:  74 PR Interval:    QRS Duration: 93 QT Interval:  403 QTC Calculation: 448 R Axis:   -6 Text Interpretation: Sinus rhythm Borderline low voltage, extremity leads Abnormal R-wave progression, early transition No significant change since last tracing 10 May 2019 Confirmed by Rolland Porter [phone]) on 06/25/2019 6:16:39 AM   Radiology DG Chest Portable 1 View  Result Date: 06/25/2019 CLINICAL DATA:  Chest pain. EXAM: PORTABLE CHEST 1 VIEW COMPARISON:  Radiograph and CT 05/10/2019 FINDINGS: Mild hyperinflation with bronchial thickening. Minimal patchy opacity at the right lung base. Upper normal heart size with unchanged mediastinal contours. No pleural effusion or pneumothorax. No pulmonary edema. IMPRESSION: Minimal patchy opacity at the right lung base, may be atelectasis or infection. Electronically Signed   By: Keith Rake M.D.   On: 06/25/2019 06:24    Procedures Procedures (including critical care time)  Medications Ordered in ED Medications  cefTRIAXone (ROCEPHIN) 1 g in sodium chloride 0.9 % 100 mL IVPB (has no administration in time range)  azithromycin (ZITHROMAX) 500 mg in sodium chloride 0.9 % 250 mL IVPB (  has no administration in time range)  metoCLOPramide (REGLAN) injection 5 mg (has no administration in time range)  diphenhydrAMINE  (BENADRYL) injection 12.5 mg (has no administration in time range)  sodium chloride flush (NS) 0.9 % injection 3 mL (3 mLs Intravenous Given 06/25/19 0621)  fentaNYL (SUBLIMAZE) injection 50 mcg (50 mcg Intravenous Given 06/25/19 0620)    ED Course  I have reviewed the triage vital signs and the nursing notes.  Pertinent labs & imaging results that were available during my care of the patient were reviewed by me and considered in my medical decision making (see chart for details).    MDM Rules/Calculators/A&P                      Patient describes chest pain consistent with cardiac etiology although she states it feels more like when she has had pneumonia before.  Patient does appear to have an infiltrate on her chest x-ray and her initial troponin is normal.  Normally I would give her aspirin and start her on nitroglycerin.  However she has a history of intracranial aneurysm that has a stent, I would prefer to be able to get a CT which will not be available until 10 AM or an MRI which is not the best test to look for bleeding but it would show her aneurysm.  Also she already has a headache so the nitroglycerin would make that worse.  Patient's x-ray does show a right sided pneumonia consistent with community-acquired pneumonia.  Her rapid Covid test is negative.  She was started on community-acquired pneumonia antibiotics.  Her headache almost sounds migraine in quality, she was given low-dose migraine cocktail due to her small size.  Pt turned over to Dr Sedonia Small at change of shift to get results of her delta troponin and MR brain.  Final Clinical Impression(s) / ED Diagnoses Final diagnoses:  Precordial pain  Community acquired pneumonia of right lower lobe of lung  Acute nonintractable headache, unspecified headache type    Rx / DC Orders  Disposition pending  Rolland Porter, MD, Barbette Or, MD 06/25/19 [phone]

## 2019-06-25 NOTE — ED Provider Notes (Signed)
  Provider Note MRN:  [id number]  Arrival date & time: 06/25/19    ED Course and Medical Decision Making  Assumed care from Dr. Tomi Bamberger at shift change.  Cough, chest pain which is atypical, work-up revealing negative troponin, likely community-acquired pneumonia on x-ray. Also with headaches, largely reassuring neurological exam but history of cerebral aneurysm with stent placement. Plan was to obtain MRI here to exclude critical stenosis or bleeding. I spoke with MRI tech, her stent is not compatible with our MRI but it is compatible with the MRI at [facility]. We will transfer to [facility] emergency department for MRI. Dr. Lennice Sites is the accepting physician. With a nonacute MRI, patient is a candidate for discharge on doxycycline.  Procedures  Final Clinical Impressions(s) / ED Diagnoses     ICD-10-CM   1. Precordial pain  R07.2   2. Community acquired pneumonia of right lower lobe of lung  J18.9   3. Acute nonintractable headache, unspecified headache type  R51.9   4. History of cerebral aneurysm  Z86.79     ED Discharge Orders    None      Discharge Instructions   None     Barth Kirks. Sedonia Small, [place]lyn [email or web address]    Maudie Flakes, MD 06/25/19 [phone]

## 2019-06-25 NOTE — Discharge Instructions (Signed)
The MRIs performed today do not show any areas of stroke and it appears that your stent placed late last year is open and there are no complications regarding this.  I reviewed your previous imaging which shows a blockage in the left carotid artery.  This needs to be followed up at your next appointment.  The blood tests and EKG for your heart today did not show any evidence of heart attack.  Your x-ray does show some questionable pneumonia and you are being discharged with antibiotics for this.  Please check in with your primary care doctor in 48 hours for recheck of your symptoms.  Return the emergency department if you have worsening or persistent chest pain, more severe persistent headache, if you pass out, or if you have any strokelike symptoms.  Return if you have weakness in your arms or legs, slurred speech, trouble walking or talking, confusion, or trouble with your balance.

## 2019-06-25 NOTE — ED Notes (Signed)
Pt in MRI.

## 2019-06-25 NOTE — ED Provider Notes (Signed)
5:31 PM patient transferred from [facility] for MRI.  Patient presented to [facility] emergency department this morning with complaint of chest pressure in setting of previous MI, and headache in setting of recent stenting due to PICA aneurysm.  MRIs performed here today do not show any evidence of new stroke or other acute problems.  Stent appears patent.  Imaging discussed with Dr. Sabra Heck.  I reviewed previous angiography and ultrasound of the left ICA.  This shows subcritical stenosis.  It could be related to the patient's dizzy spells, however no indication for further emergent evaluation.  Patient will follow up with her doctor in April regarding this.  I reviewed chest pain/pressure work-up from earlier today.  Patient had 2 negative troponins.  She had a chest x-ray which showed possible pneumonia.  Patient will be discharged home with doxycycline for this.  She received a dose of antibiotics earlier today.  Rapid coronavirus test was negative.  I have ordered PCR testing.  At this point, patient is stable.  She has a mild residual headache and some "fogginess" but otherwise is stable for discharge to home.  Encourage PCP follow-up in regards to her pneumonia and chest pressure in the next 2 days.  Vitals are stable.  BP 113/60 (BP Location: Left Arm)   Pulse 74   Temp 98 F (36.7 C) (Oral)   Resp 16   Ht 5' (1.524 m)   Wt 58.1 kg   SpO2 100%   BMI 25.00 kg/m      Carlisle Cater, PA-C 06/25/19 1734    Noemi Chapel, MD 06/28/19 1318

## 2019-06-25 NOTE — ED Notes (Signed)
Report given to [facility]

## 2019-06-25 NOTE — ED Notes (Signed)
Pt here from [facility] for MRI evaluation of stent placed for cerebral aneurysm. Pt alert and oriented at this time in no acute distress.

## 2019-06-25 NOTE — ED Triage Notes (Signed)
Pt c/o left side chest pain that radiates to her neck and jaw; pt states the pain woke her up from sleep x 3 hours ago; pt describes the pain as pressure and states she has had episodes of diaphoresis and sob with nausea; pt c/o lightheadedness

## 2019-06-25 NOTE — ED Notes (Signed)
Called [facility]  to get a truck to transfer Pt to [facility] ED as requested.  Spoke with [employer].  Pt in need of an MRI. Accepting doctor in ED at [facility] is Lennice Sites as stated by Dr. Stark Jock.

## 2019-06-25 NOTE — ED Notes (Signed)
Per Marden Noble to [place], [place], "May be a couple hours before a truck is available, to transfer Pt to [facility] ED".

## 2019-06-25 NOTE — ED Provider Notes (Signed)
Patient arrives from [facility] emergency room for MRI.  Patient states that her headache has returned, was given medication while she was at any pen that helped and was requesting repeat dose.  No allergies to pain medications.  Plan is to give patient medication for her headache while awaiting MRI. Physical Exam  BP 113/60 (BP Location: Left Arm)   Pulse 74   Temp 98 F (36.7 C) (Oral)   Resp 16   Ht 5' (1.524 m)   Wt 58.1 kg   SpO2 100%   BMI 25.00 kg/m   Physical Exam Awake, alert, respirations even and unlabored, speech is clear, symmetric facial movements. ED Course/Procedures     Procedures  MDM  Oncoming provider notified, awaiting MRI.      Tacy Learn, PA-C 06/25/19 [place], [place], DO 06/25/19 1635

## 2019-06-29 ENCOUNTER — Encounter (HOSPITAL_COMMUNITY): Payer: Self-pay

## 2019-06-29 ENCOUNTER — Other Ambulatory Visit: Payer: Self-pay

## 2019-06-29 ENCOUNTER — Emergency Department (HOSPITAL_COMMUNITY)
Admission: EM | Admit: 2019-06-29 | Discharge: 2019-06-29 | Disposition: A | Discharge status: Home / Self Care | Payer: No Typology Code available for payment source | Attending: Emergency Medicine | Admitting: Emergency Medicine

## 2019-06-29 DIAGNOSIS — I110 Hypertensive heart disease with heart failure: Secondary | ICD-10-CM | POA: Insufficient documentation

## 2019-06-29 DIAGNOSIS — I5042 Chronic combined systolic (congestive) and diastolic (congestive) heart failure: Secondary | ICD-10-CM | POA: Diagnosis not present

## 2019-06-29 DIAGNOSIS — J189 Pneumonia, unspecified organism: Secondary | ICD-10-CM | POA: Diagnosis not present

## 2019-06-29 DIAGNOSIS — I252 Old myocardial infarction: Secondary | ICD-10-CM | POA: Diagnosis not present

## 2019-06-29 DIAGNOSIS — Z79899 Other long term (current) drug therapy: Secondary | ICD-10-CM | POA: Diagnosis not present

## 2019-06-29 DIAGNOSIS — R0602 Shortness of breath: Secondary | ICD-10-CM | POA: Diagnosis present

## 2019-06-29 DIAGNOSIS — I2510 Atherosclerotic heart disease of native coronary artery without angina pectoris: Secondary | ICD-10-CM | POA: Diagnosis not present

## 2019-06-29 DIAGNOSIS — F17210 Nicotine dependence, cigarettes, uncomplicated: Secondary | ICD-10-CM | POA: Insufficient documentation

## 2019-06-29 DIAGNOSIS — Z7982 Long term (current) use of aspirin: Secondary | ICD-10-CM | POA: Insufficient documentation

## 2019-06-29 NOTE — ED Triage Notes (Signed)
Pt was recently seen here and diagnosed with PNA and sent to [facility] for admission. PT completed anbx yesterday, still has some sob with exertion, and cough has improved. Pt states she needs a f/u xray for work [employer]), as employer has requested pt to seek f/u to ensure PNA is gone before returning to work.

## 2019-06-29 NOTE — Discharge Instructions (Signed)
Rest at home until Monday.  Follow-up with your doctor if any problem

## 2019-06-29 NOTE — ED Provider Notes (Signed)
[facility] EMERGENCY DEPARTMENT Provider Note   CSN: [id number] Arrival date & time: 06/29/19  [id number]     History Chief Complaint  Patient presents with  . Pneumonia    f/u visit    [name] is a 59 y.o. female.  Patient was diagnosed with pneumonia last week and put on antibiotics she is finished her antibiotics now.  She complains of mild shortness of breath at work  The history is provided by the patient. No language interpreter was used.  Pneumonia This is a new problem. The current episode started more than 2 days ago. The problem occurs constantly. The problem has been gradually improving. Pertinent negatives include no chest pain, no abdominal pain and no headaches. Nothing aggravates the symptoms. Nothing relieves the symptoms. She has tried nothing for the symptoms. The treatment provided no relief.       Past Medical History:  Diagnosis Date  . Anxiety state 03/25/2016  . Basal cell carcinoma of forehead   . CHF (congestive heart failure) ([place])   . Coronary artery disease    a. 03/11/16 PCI with DES-->Prox/Mid Cx;  b. 03/14/16 PCI with DES x2-->RCA, EF 30-35%.  . Essential hypertension   . GERD (gastroesophageal reflux disease)   . HFrEF (heart failure with reduced ejection fraction) ([place])    a. 10/2016 Echo: EF 35-40%, Gr1 DD, mild focal basal septal hypertrophy, basal inflat, mid inflat, basal antlat AK. Mid infept/inf/antlat, apical lateral sev HK. Mod MR. mildly reduced RV fxn. Mild TR.  [name] Kitchen History of pneumonia   . Hyperlipidemia   . IBS (irritable bowel syndrome)    diarrhea  . Ischemic cardiomyopathy    a. 10/2016 Echo: EF 35-40%, Gr1 DD.  [name] Kitchen Mitral regurgitation   . NSTEMI (non-ST elevated myocardial infarction) ([place]) 03/10/2016  . Pneumonia 03/2016  . Squamous cell cancer of skin of nose   . Thrombocytosis ([place]) 03/26/2016  . Tobacco abuse   . Trichimoniasis   . Wears dentures   . Wears glasses     Patient Active Problem List   Diagnosis Date  Noted  . Brain aneurysm 04/15/2019  . Dysphagia 02/27/2018  . Encounter for screening colonoscopy 02/27/2018  . History of Clostridium difficile infection 02/27/2018  . Frequent stools 12/20/2017  . Chronic combined systolic and diastolic CHF (congestive heart failure) ([place]) 06/20/2016  . Hypokalemia   . Acute CHF (congestive heart failure) ([place]) 05/16/2016  . Acute on chronic systolic CHF (congestive heart failure) ([place]) 05/16/2016  . Acute respiratory failure with hypoxia ([place])   . Thrombocytosis ([place]) 03/26/2016  . Cardiomyopathy, ischemic 03/25/2016  . Chronic combined systolic and diastolic heart failure ([place]) 03/25/2016  . Anxiety state 03/25/2016  . Troponin level elevated 03/25/2016  . CAD (coronary artery disease), native coronary artery 03/25/2016  . Normocytic anemia 03/25/2016  . SOB (shortness of breath) 03/24/2016  . Lightheadedness 03/17/2016  . Hypotension 03/17/2016  . Tobacco abuse 03/12/2016  . NSTEMI (non-ST elevated myocardial infarction) ([place]) 03/11/2016  . Pain in the chest   . Essential hypertension 09/06/2015  . Mixed hyperlipidemia 09/06/2015  . GERD (gastroesophageal reflux disease) 09/06/2015  . Chest pain 09/06/2015    Past Surgical History:  Procedure Laterality Date  . APPENDECTOMY    . BIOPSY  09/20/2018   Procedure: BIOPSY;  Surgeon: Daneil Dolin, MD;  Location[facility];  Service: Endoscopy;;  colon  . CARDIAC CATHETERIZATION N/A 03/11/2016   Procedure: Left Heart Cath and Coronary Angiography;  Surgeon: Leonie Man,  MD;  Location: [place] CV LAB;  Service: Cardiovascular;  Laterality: N/A;  . CARDIAC CATHETERIZATION N/A 03/11/2016   Procedure: Coronary Stent Intervention;  Surgeon: Leonie Man, MD;  Location: [place] CV LAB;  Service: Cardiovascular;  Laterality: N/A;  . CARDIAC CATHETERIZATION N/A 03/14/2016   Procedure: Coronary Stent Intervention;  Surgeon: Peter M Martinique, MD;  Location: [place] CV LAB;  Service:  Cardiovascular;  Laterality: N/A;  . CHOLECYSTECTOMY OPEN  1984  . COLONOSCOPY WITH PROPOFOL N/A 09/20/2018   Procedure: COLONOSCOPY WITH PROPOFOL;  Surgeon: Daneil Dolin, MD;  Location: [facility];  Service: Endoscopy;  Laterality: N/A;  10:30am  . CORONARY ANGIOPLASTY WITH STENT PLACEMENT  03/14/2016  . ESOPHAGOGASTRODUODENOSCOPY (EGD) WITH PROPOFOL N/A 09/20/2018   Procedure: ESOPHAGOGASTRODUODENOSCOPY (EGD) WITH PROPOFOL;  Surgeon: Daneil Dolin, MD;  [facility];  Service: Endoscopy;  Laterality: N/A;  . FINGER ARTHROPLASTY Left 05/14/2013   Procedure: LEFT THUMB CARPAL METACARPAL ARTHROPLASTY;  Surgeon: Tennis Must, MD;  Location: [facility];  Service: Orthopedics;  Laterality: Left;  . IR ANGIO INTRA EXTRACRAN SEL COM CAROTID INNOMINATE BILAT MOD SED  01/05/2017  . IR ANGIO INTRA EXTRACRAN SEL COM CAROTID INNOMINATE BILAT MOD SED  03/19/2019  . IR ANGIO INTRA EXTRACRAN SEL INTERNAL CAROTID UNI L MOD SED  04/15/2019  . IR ANGIO VERTEBRAL SEL VERTEBRAL BILAT MOD SED  01/05/2017  . IR ANGIO VERTEBRAL SEL VERTEBRAL BILAT MOD SED  03/19/2019  . IR ANGIOGRAM FOLLOW UP STUDY  04/15/2019  . IR RADIOLOGIST EVAL & MGMT  12/30/2016  . IR TRANSCATH/EMBOLIZ  04/15/2019  . IR US GUIDE VASC ACCESS RIGHT  03/19/2019  . MALONEY DILATION N/A 09/20/2018   Procedure: Venia Minks DILATION;  Surgeon: Daneil Dolin, MD;  [facility];  Service: Endoscopy;  Laterality: N/A;  . RADIOLOGY WITH ANESTHESIA N/A 04/15/2019   Procedure: [employer];  Surgeon: Luanne Bras, MD;  Location: [place];  Service: Radiology;  Laterality: N/A;  . TUBAL LIGATION  1987  . VAGINAL HYSTERECTOMY  2009     OB History    Gravida  4   Para  4   Term  4   Preterm      AB      Living  4     SAB      TAB      Ectopic      Multiple      Live Births              Family History  Problem Relation Age of Onset  . Diabetes Father   . Hypertension Father   . CAD Father    . Colon polyps Father 60       pre-cancerous   . Stroke Father   . Dementia Father   . Stroke Mother   . Hypertension Mother   . Diabetes Mother   . Heart failure Other   . Breast cancer Maternal Grandmother   . Colon cancer Neg Hx     Social History   Tobacco Use  . Smoking status: Current Some Day Smoker    Packs/day: 0.10    Years: 15.00    Pack years: 1.50    Types: Cigarettes  . Smokeless tobacco: Never Used  . Tobacco comment: smokes a cigarette occasionally  Substance Use Topics  . Alcohol use: Not Currently    Comment: occasionally  . Drug use: Not Currently    Types: Marijuana  Comment: former- 2017 last time    Home Medications Prior to Admission medications   Medication Sig Start Date End Date Taking? Authorizing Provider  acetaminophen (TYLENOL) 325 MG tablet Take 325 mg by mouth every 6 (six) hours as needed for mild pain or headache.     [provider]  ALPRAZolam Duanne Moron) 1 MG tablet Take 1 mg by mouth at bedtime.     [provider]  aspirin EC 81 MG tablet Take 81 mg by mouth every evening.     [provider]  atorvastatin (LIPITOR) 80 MG tablet TAKE 1 TABLET BY MOUTH ONCE DAILY AT  6  IN  THE  EVENING Patient taking differently: Take 80 mg by mouth daily at 6 PM.  05/08/19   Herminio Commons, MD  calcium carbonate (TUMS - DOSED IN MG ELEMENTAL CALCIUM) 500 MG chewable tablet Chew 1 tablet by mouth 3 times/day as needed-between meals & bedtime for indigestion or heartburn.    [provider]  clopidogrel (PLAVIX) 75 MG tablet Take 1 tablet (75 mg total) by mouth daily. 06/24/19   Herminio Commons, MD  doxycycline (VIBRAMYCIN) 100 MG capsule Take 1 capsule (100 mg total) by mouth 2 (two) times daily. 06/25/19   Carlisle Cater, PA-C  ezetimibe (ZETIA) 10 MG tablet Take 1 tablet by mouth once daily 05/08/19   Herminio Commons, MD  furosemide (LASIX) 20 MG tablet Take 1 tablet by mouth once daily 06/06/19    Herminio Commons, MD  gabapentin (NEURONTIN) 100 MG capsule Take 100 mg by mouth 3 (three) times daily.    [provider]  loperamide (IMODIUM) 2 MG capsule Take 2 mg by mouth 4 (four) times daily as needed for diarrhea or loose stools (ibs).    [provider]  LORazepam (ATIVAN) 1 MG tablet Take 1 mg by mouth daily as needed for anxiety.  11/09/17   [provider]  metoprolol tartrate (LOPRESSOR) 25 MG tablet Take 0.5 tablets (12.5 mg total) by mouth 2 (two) times daily. 02/22/19 05/23/19  Herminio Commons, MD  Multiple Vitamins-Minerals (MULTIVITAMIN WITH MINERALS) tablet Take 1 tablet by mouth daily.    [provider]  nicotine (NICODERM CQ - DOSED IN MG/24 HOURS) 21 mg/24hr patch Place 21 mg onto the skin daily.    [provider]  nitroGLYCERIN (NITROSTAT) 0.4 MG SL tablet Place 1 tablet (0.4 mg total) under the tongue every 5 (five) minutes x 3 doses as needed for chest pain. 03/05/18   Manuella Ghazi, Pratik D, DO  ondansetron (ZOFRAN ODT) 4 MG disintegrating tablet Take 1 tablet (4 mg total) by mouth every 8 (eight) hours as needed. 10/23/18   Fredia Sorrow, MD  ondansetron (ZOFRAN) 4 MG tablet Take 1 tablet (4 mg total) by mouth every 8 (eight) hours as needed for nausea or vomiting. 05/11/19   Mesner, Corene Cornea, MD  potassium chloride SA (KLOR-CON) 20 MEQ tablet Take 1 tablet by mouth once daily 04/10/19   Herminio Commons, MD  rOPINIRole (REQUIP) 0.5 MG tablet Take 0.5 mg by mouth at bedtime.  11/13/17   [provider]  sacubitril-valsartan (ENTRESTO) 24-26 MG Take 1 tablet by mouth 2 (two) times daily. 02/22/19   Herminio Commons, MD    Allergies    Tape  Review of Systems   Review of Systems  Constitutional: Negative for appetite change and fatigue.  HENT: Negative for congestion, ear discharge and sinus pressure.   Eyes: Negative for discharge.  Respiratory: Negative for cough.   Cardiovascular: Negative for chest pain.   Gastrointestinal: Negative for abdominal pain and diarrhea.  Genitourinary: Negative for frequency and hematuria.  Musculoskeletal: Negative for back pain.  Skin: Negative for rash.  Neurological: Negative for seizures and headaches.  Psychiatric/Behavioral: Negative for hallucinations.    Physical Exam Updated Vital Signs BP 112/82 (BP Location: Right Arm)   Pulse 80   Temp 98.1 F (36.7 C) (Oral)   Resp 18   Ht 5' (1.524 m)   Wt 57.6 kg   SpO2 100%   BMI 24.80 kg/m   Physical Exam Vitals and nursing note reviewed.  Constitutional:      Appearance: Normal appearance. She is well-developed.  HENT:     Head: Normocephalic.     Nose: Nose normal.  Eyes:     Conjunctiva/sclera: Conjunctivae normal.  Neck:     Thyroid: No thyromegaly.  Cardiovascular:     Rate and Rhythm: Normal rate and regular rhythm.  Pulmonary:     Effort: Pulmonary effort is normal.  Musculoskeletal:        General: Normal range of motion.     Cervical back: Neck supple.  Neurological:     Mental Status: She is alert and oriented to person, place, and time.     Motor: No abnormal muscle tone.     Coordination: Coordination normal.  Psychiatric:        Behavior: Behavior normal.     ED Results / Procedures / Treatments   Labs (all labs ordered are listed, but only abnormal results are displayed) Labs Reviewed - No data to display  EKG None  Radiology No results found.  Procedures Procedures (including critical care time)  Medications Ordered in ED Medications - No data to display  ED Course  I have reviewed the triage vital signs and the nursing notes.  Pertinent labs & imaging results that were available during my care of the patient were reviewed by me and considered in my medical decision making (see chart for details).    MDM Rules/Calculators/A&P                      Patient with pneumonia and still some fatigue.  Her vital signs are normal and pulse ox are normal.  She  will rest at home for 2 more days and then go back to work Final Clinical Impression(s) / ED Diagnoses Final diagnoses:  Community acquired pneumonia of left lower lobe of lung    Rx / DC Orders ED Discharge Orders    None       Milton Ferguson, MD 06/29/19 [phone]

## 2019-07-01 ENCOUNTER — Telehealth: Payer: Self-pay | Admitting: Cardiovascular Disease

## 2019-07-01 MED ORDER — ENTRESTO 24-26 MG PO TABS: 1.0000 | ORAL_TABLET | Freq: Two times a day (BID) | ORAL | 1 refills | Status: DC

## 2019-07-01 NOTE — Telephone Encounter (Signed)
*  STAT* If patient is at the pharmacy, call can be transferred to refill team.   1. Which medications need to be refilled?  sacubitril-valsartan (ENTRESTO) 24-26 MG  2. Which pharmacy/location (including street and city if local pharmacy) is medication to be sent to? [facility]  Phone [phone]  3. Do they need a 30 day or 90 day supply?

## 2019-07-01 NOTE — Telephone Encounter (Signed)
Done

## 2019-07-03 ENCOUNTER — Other Ambulatory Visit (HOSPITAL_COMMUNITY): Payer: Self-pay | Admitting: Interventional Radiology

## 2019-07-03 DIAGNOSIS — I771 Stricture of artery: Secondary | ICD-10-CM

## 2019-07-05 ENCOUNTER — Other Ambulatory Visit: Payer: Self-pay

## 2019-07-05 ENCOUNTER — Ambulatory Visit (HOSPITAL_COMMUNITY)
Admission: RE | Admit: 2019-07-05 | Discharge: 2019-07-05 | Disposition: A | Discharge status: Home / Self Care | Payer: No Typology Code available for payment source | Source: Ambulatory Visit | Attending: Interventional Radiology | Admitting: Interventional Radiology

## 2019-07-05 ENCOUNTER — Other Ambulatory Visit: Payer: Self-pay | Admitting: Cardiovascular Disease

## 2019-07-05 DIAGNOSIS — I771 Stricture of artery: Secondary | ICD-10-CM | POA: Diagnosis present

## 2019-07-09 ENCOUNTER — Telehealth (HOSPITAL_COMMUNITY): Payer: Self-pay

## 2019-07-09 NOTE — Telephone Encounter (Signed)
Pt agreed to f/u in 1 year with mri/mra. She will f/u with neurologist or PCP for dizziness management. AW

## 2019-07-17 ENCOUNTER — Other Ambulatory Visit: Payer: Self-pay

## 2019-07-17 MED ORDER — ENTRESTO 24-26 MG PO TABS: 1.0000 | ORAL_TABLET | Freq: Two times a day (BID) | ORAL | 1 refills | Status: DC

## 2019-07-17 NOTE — Telephone Encounter (Signed)
Pt states her Insurance filled her entresto through the mail. She states insurance has made changes and now she is to refill through a Pharmacy.  Please call [facility] [phone]  Thanks renee

## 2019-08-01 ENCOUNTER — Other Ambulatory Visit: Payer: Self-pay | Admitting: Cardiovascular Disease

## 2019-08-05 ENCOUNTER — Telehealth: Payer: Self-pay | Admitting: *Deleted

## 2019-08-05 MED ORDER — ATORVASTATIN CALCIUM 80 MG PO TABS: ORAL_TABLET | ORAL | 2 refills | Status: DC

## 2019-08-05 MED ORDER — POTASSIUM CHLORIDE CRYS ER 20 MEQ PO TBCR: 20.0000 meq | EXTENDED_RELEASE_TABLET | Freq: Every day | ORAL | 2 refills | Status: DC

## 2019-08-05 MED ORDER — CLOPIDOGREL BISULFATE 75 MG PO TABS: 75.0000 mg | ORAL_TABLET | Freq: Every day | ORAL | 2 refills | Status: DC

## 2019-08-06 ENCOUNTER — Other Ambulatory Visit: Payer: Self-pay

## 2019-08-06 NOTE — Telephone Encounter (Signed)
Waiting on hold to talk with pharmacy at [facility]  Per pharmacy, there is a system error, any rx they drop will not fill for patient..They gave patient 3 days supply of medication.    She will call me back with update.

## 2019-08-06 NOTE — Telephone Encounter (Signed)
Per Pt she is con't told by [facility] that she need Authorization to refill Kt and Plavix.  [facility] [phone]  Pt is requesting a call. She would like to know should she change Pharmacy. [phone]  Thanks renee

## 2019-08-12 ENCOUNTER — Other Ambulatory Visit (HOSPITAL_COMMUNITY): Payer: Self-pay

## 2019-08-12 ENCOUNTER — Encounter (HOSPITAL_COMMUNITY): Payer: Self-pay | Admitting: Cardiology

## 2019-08-12 ENCOUNTER — Ambulatory Visit (HOSPITAL_COMMUNITY)
Admission: RE | Admit: 2019-08-12 | Discharge: 2019-08-12 | Disposition: A | Discharge status: Home / Self Care | Payer: No Typology Code available for payment source | Source: Ambulatory Visit | Attending: Cardiology | Admitting: Cardiology

## 2019-08-12 ENCOUNTER — Other Ambulatory Visit: Payer: Self-pay

## 2019-08-12 VITALS — BP 130/82 | HR 85 | Wt 133.6 lb

## 2019-08-12 DIAGNOSIS — I255 Ischemic cardiomyopathy: Secondary | ICD-10-CM | POA: Insufficient documentation

## 2019-08-12 DIAGNOSIS — Z8249 Family history of ischemic heart disease and other diseases of the circulatory system: Secondary | ICD-10-CM | POA: Diagnosis not present

## 2019-08-12 DIAGNOSIS — I739 Peripheral vascular disease, unspecified: Secondary | ICD-10-CM | POA: Insufficient documentation

## 2019-08-12 DIAGNOSIS — I2510 Atherosclerotic heart disease of native coronary artery without angina pectoris: Secondary | ICD-10-CM | POA: Insufficient documentation

## 2019-08-12 DIAGNOSIS — I25810 Atherosclerosis of coronary artery bypass graft(s) without angina pectoris: Secondary | ICD-10-CM | POA: Diagnosis not present

## 2019-08-12 DIAGNOSIS — Z7982 Long term (current) use of aspirin: Secondary | ICD-10-CM | POA: Diagnosis not present

## 2019-08-12 DIAGNOSIS — I6529 Occlusion and stenosis of unspecified carotid artery: Secondary | ICD-10-CM | POA: Insufficient documentation

## 2019-08-12 DIAGNOSIS — Z7902 Long term (current) use of antithrombotics/antiplatelets: Secondary | ICD-10-CM | POA: Diagnosis not present

## 2019-08-12 DIAGNOSIS — I5022 Chronic systolic (congestive) heart failure: Secondary | ICD-10-CM | POA: Insufficient documentation

## 2019-08-12 DIAGNOSIS — I5042 Chronic combined systolic (congestive) and diastolic (congestive) heart failure: Secondary | ICD-10-CM

## 2019-08-12 DIAGNOSIS — I671 Cerebral aneurysm, nonruptured: Secondary | ICD-10-CM | POA: Insufficient documentation

## 2019-08-12 DIAGNOSIS — Z79899 Other long term (current) drug therapy: Secondary | ICD-10-CM | POA: Diagnosis not present

## 2019-08-12 DIAGNOSIS — I252 Old myocardial infarction: Secondary | ICD-10-CM | POA: Diagnosis not present

## 2019-08-12 DIAGNOSIS — F17210 Nicotine dependence, cigarettes, uncomplicated: Secondary | ICD-10-CM | POA: Diagnosis not present

## 2019-08-12 DIAGNOSIS — I209 Angina pectoris, unspecified: Secondary | ICD-10-CM | POA: Diagnosis not present

## 2019-08-12 LAB — CBC
HCT: 35.9 % — ABNORMAL LOW (ref 36.0–46.0)
Hemoglobin: 11.5 g/dL — ABNORMAL LOW (ref 12.0–15.0)
MCH: 28.2 pg (ref 26.0–34.0)
MCHC: 32 g/dL (ref 30.0–36.0)
MCV: 88 fL (ref 80.0–100.0)
Platelets: 407 10*3/uL — ABNORMAL HIGH (ref 150–400)
RBC: 4.08 MIL/uL (ref 3.87–5.11)
RDW: 14.6 % (ref 11.5–15.5)
WBC: 6.8 10*3/uL (ref 4.0–10.5)
nRBC: 0 % (ref 0.0–0.2)

## 2019-08-12 LAB — BASIC METABOLIC PANEL
Anion gap: 7 (ref 5–15)
BUN: 14 mg/dL (ref 6–20)
CO2: 22 mmol/L (ref 22–32)
Calcium: 9.3 mg/dL (ref 8.9–10.3)
Chloride: 110 mmol/L (ref 98–111)
Creatinine, Ser: 0.81 mg/dL (ref 0.44–1.00)
GFR calc Af Amer: >60 mL/min (ref >60)
GFR calc non Af Amer: >60 mL/min (ref >60)
Glucose, Bld: 102 mg/dL — ABNORMAL HIGH (ref 70–99)
Potassium: 4.3 mmol/L (ref 3.5–5.1)
Sodium: 139 mmol/L (ref 135–145)

## 2019-08-12 LAB — HEMOGLOBIN A1C
Hgb A1c MFr Bld: 5.9 % — ABNORMAL HIGH (ref 4.8–5.6)
Mean Plasma Glucose: 122.63 mg/dL

## 2019-08-12 MED ORDER — SODIUM CHLORIDE 0.9% FLUSH: 3.0000 mL | Freq: Two times a day (BID) | INTRAVENOUS | Status: DC

## 2019-08-12 MED ORDER — CARVEDILOL 3.125 MG PO TABS: 3.1250 mg | ORAL_TABLET | Freq: Two times a day (BID) | ORAL | 4 refills | Status: DC

## 2019-08-12 MED ORDER — SPIRONOLACTONE 25 MG PO TABS: 12.5000 mg | ORAL_TABLET | Freq: Every day | ORAL | 3 refills | Status: DC

## 2019-08-12 NOTE — H&P (View-Only) (Signed)
PCP: [name], MD Cardiology: [name] HF Cardiology: [name]  59 y.o. with history of CAD, ischemic cardiomyopathy, and PAD was referred by [name] for evaluation of CHF.  She had NSTEMI in 11/17 with PCI to prox/mid LCx and RCA.  Subsequently, has developed ischemic cardiomyopathy.  Most recent echo in 1/21 showed EF 30-35%.  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   She fatigues easily.  Main complaint seems to be episodes of central chest heaviness.  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  The chest heaviness does seem to be related to exertion. She takes NTG about twice a week.  She has been having the chest symptoms for about 2 months.  She is short of breath walking up stairs or walking to the mailbox (50-100 yards).  She has bilateral calf cramping, sometimes with walking but also at times at rest.  No lightheadedness. No orthopnea/PND.    ECG (personally reviewed): NSR, normal  Labs (2/21): K 3.7, creatinine 0.82  PMH: 1. CAD: NSTEMI with DES to proximal and mid LCx in 11/17, staged DES x 2 to RCA later in 11/17.  - Cardiolite (10/20): EF < 30%, large inferior and inferolateral MI with mild peri-infarct ischemia.  2. Chronic systolic CHF: Ischemic cardiomyopathy.  - Echo (10/20): EF 35-40%, lateral WMAs, normal RV. - Echo (1/21): EF 30-35%, mild LVH, normal RV 3. 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  4. Carotid stenosis: Carotid dopplers ([id number]) with [id number] LICA stenosis.  5. Active smoker.   Social History   Socioeconomic History  . Marital status: Married    Spouse name: Not on file  . Number of children: Not on file  . Years of education: Not on file  . Highest education level: Not on file  Occupational History  . Occupation: CNA  Tobacco Use  . Smoking status: Current Some Day Smoker    Packs/day: 0.10    Years: 15.00    Pack years: 1.50   Types: Cigarettes  . Smokeless tobacco: Never Used  . Tobacco comment: smokes a cigarette occasionally  Substance and Sexual Activity  . Alcohol use: Not Currently    Comment: occasionally  . Drug use: Not Currently    Types: Marijuana    Comment: former- 2017 last time  . Sexual activity: Not Currently    Birth control/protection: Surgical    Comment: hyst  Other Topics Concern  . Not on file  Social History Narrative   Lives with husband in [place] in a one story home with a basement.  Has 4 children.  Works as a Quarry manager.  Education: CNA school.    Social Determinants of Health   Financial Resource Strain:   . Difficulty of Paying Living Expenses:   Food Insecurity:   . Worried About Charity fundraiser in the Last Year:   . Arboriculturist in the Last Year:   Transportation Needs:   . Film/video editor (Medical):   [name] Kitchen Lack of Transportation (Non-Medical):   Physical Activity:   . Days of Exercise per Week:   . Minutes of Exercise per Session:   Stress:   . Feeling of Stress :   Social Connections:   . Frequency of Communication with Friends and Family:   . Frequency of Social Gatherings with Friends and Family:   . Attends Religious Services:   . Active Member of Clubs  or Organizations:   . Attends Archivist Meetings:   [name] Kitchen Marital Status:   Intimate Partner Violence:   . Fear of Current or Ex-Partner:   . Emotionally Abused:   [name] Kitchen Physically Abused:   . Sexually Abused:    Family History  Problem Relation Age of Onset  . Diabetes Father   . Hypertension Father   . CAD Father   . Colon polyps Father 60       pre-cancerous   . Stroke Father   . Dementia Father   . Stroke Mother   . Hypertension Mother   . Diabetes Mother   . Heart failure Other   . Breast cancer Maternal Grandmother   . Colon cancer Neg Hx    ROS: All systems reviewed and negative except as per HPI.   Current Outpatient Medications  Medication Sig Dispense Refill  .  acetaminophen (TYLENOL) 325 MG tablet Take 325 mg by mouth every 6 (six) hours as needed for mild pain or headache.     . ALPRAZolam (XANAX) 1 MG tablet Take 1-2 mg by mouth at bedtime as needed for anxiety.     [name] Kitchen aspirin EC 81 MG tablet Take 81 mg by mouth every evening.     [name] Kitchen atorvastatin (LIPITOR) 80 MG tablet TAKE 1 TABLET BY MOUTH ONCE DAILY AT  6  IN  THE  EVENING (Patient taking differently: Take 80 mg by mouth daily at 6 PM. ) 90 tablet 2  . clopidogrel (PLAVIX) 75 MG tablet Take 1 tablet (75 mg total) by mouth daily. (Patient taking differently: Take 37.5 mg by mouth in the morning and at bedtime. ) 90 tablet 2  . ezetimibe (ZETIA) 10 MG tablet Take 1 tablet by mouth once daily (Patient taking differently: Take 10 mg by mouth daily. ) 30 tablet 6  . furosemide (LASIX) 20 MG tablet Take 1 tablet by mouth once daily (Patient taking differently: Take 20 mg by mouth daily. ) 90 tablet 1  . gabapentin (NEURONTIN) 100 MG capsule Take 100 mg by mouth 2 (two) times daily.     [name] Kitchen loperamide (IMODIUM) 2 MG capsule Take 2 mg by mouth 4 (four) times daily as needed for diarrhea or loose stools (ibs).    . LORazepam (ATIVAN) 1 MG tablet Take 1 mg by mouth daily as needed for anxiety.   1  . Multiple Vitamins-Minerals (MULTIVITAMIN WITH MINERALS) tablet Take 1 tablet by mouth daily.    . nicotine (NICODERM CQ - DOSED IN MG/24 HOURS) 21 mg/24hr patch Place 21 mg onto the skin daily.    . nitroGLYCERIN (NITROSTAT) 0.4 MG SL tablet Place 1 tablet (0.4 mg total) under the tongue every 5 (five) minutes x 3 doses as needed for chest pain. 30 tablet 12  . potassium chloride SA (KLOR-CON) 20 MEQ tablet Take 1 tablet (20 mEq total) by mouth daily. 90 tablet 2  . rOPINIRole (REQUIP) 0.5 MG tablet Take 0.5 mg by mouth at bedtime.   0  . sacubitril-valsartan (ENTRESTO) 24-26 MG Take 1 tablet by mouth 2 (two) times daily. 180 tablet 1  . carvedilol (COREG) 3.125 MG tablet Take 1 tablet (3.125 mg total) by mouth 2 (two)  times daily with a meal. (Patient taking differently: Take 3.125 mg by mouth daily. ) 60 tablet 4  . spironolactone (ALDACTONE) 25 MG tablet Take 0.5 tablets (12.5 mg total) by mouth daily. 15 tablet 3  . zinc gluconate 50 MG tablet Take 50 mg by mouth  daily.     No current facility-administered medications for this encounter.   BP 130/82   Pulse 85   Wt 60.6 kg (133 lb 9.6 oz)   SpO2 100%   BMI 26.09 kg/m  General: NAD Neck: No JVD, no thyromegaly or thyroid nodule.  Lungs: Clear to auscultation bilaterally with normal respiratory effort. CV: Nondisplaced PMI.  Heart regular S1/S2, no S3/S4, no murmur.  No peripheral edema.  No carotid bruit.  Unable to palpate pedal pulses.  Abdomen: Soft, nontender, no hepatosplenomegaly, no distention.  Skin: Intact without lesions or rashes.  Neurologic: Alert and oriented x 3.  Psych: Normal affect. Extremities: No clubbing or cyanosis.  HEENT: Normal.   Assessment/Plan: 1. CAD: S/p NSTEMI in 11/17 with DEX to LCx and DES x 2 to RCA.  Cardiolite in 10/20 with EF <30%, inferior/inferolateral infarct with peri-infarct ischemia. She has been having exertional chest heaviness for about 2 months and has been taking NTG about twice a week.   - With significant exertional symptoms, I will arrange for coronary angiography.  We discussed risks/benefits and she agreed to procedure.  - Continue atorvastatin and Zetia.   - Continue ASA 81 and Plavix 75 mg daily for now.  She will likely be a good candidate to replace Plavix with rivaroxaban 2.5 mg bid in the future.  2. Chronic systolic CHF: Ischemic cardiomyopathy. Last echo in 1/21 with EF 30-35%.  NYHA class III symptoms.  She is not volume overloaded on exam.  - Continue Entresto. - Stop metoprolol tartrate, start Coreg 3.125 mg bid.  - Start spironolactone 12.5 mg daily.  BMET today and again in 10 days.  - Continue Lasix 20 mg daily.  - Narrow QRS so not CRT candidate.  - Repeat echo in 3-4 months  after medication titration and cath, she may need an ICD.  - Will plan RHC along with coronary angiography (see above).  3. PAD: Patient has symptoms consistent with claudication and pulses difficult to palpate.  - I will arrange for ABIs.  4. Carotid stenosis: Repeat carotid dopplers in 10/21.   5. Smoking: Strongly encouraged her to quit.  She is cutting back a lot and is using nicotine patches.  6. 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   Erin [name] 08/12/2019

## 2019-08-12 NOTE — Patient Instructions (Addendum)
STOP Metoprolol  START Coreg (Carvedilol) 3.125mg  (1 tab) twice a day  START Spironolactone 12.5mg  (1/2 tab) daily  You have been ordered for an ultrasound of your legs. You will get a call to schedule this appointment.   Your physician recommends that you schedule a follow-up appointment in: 3 weeks with the Nurse Lamberton Price [place] [id number] [place] [id number] Dept: [phone] Loc: [place]  08/12/2019  You are scheduled for a Cardiac Catheterization on Monday, April 12 with Dr. Loralie Champagne.  1. Please arrive at the [facility] (Main Entrance A) at [facility]: [address] [place], [place] [address] at 5:30 AM (This time is two hours before your procedure to ensure your preparation). Free valet parking service is available.   Special note: Every effort is made to have your procedure done on time. Please understand that emergencies sometimes delay scheduled procedures.  2. Diet: Do not eat solid foods or liquids after midnight.  3. Labs: You had labs drawn today in office.   COVID SCREEN:   WHEN: Friday April 9th anytime between 9am-3pm WHERE: [facility]  [place] [address]  This is a drive thru testing site, you will remain in your car. Be sure to get in the line FOR PROCEDURES Once you have been swabbed you will need to remain home in quarantine until you return for your procedure.   4. Medication instructions in preparation for your procedure:   Contrast Allergy: No   HOLD ALL medications on the morning of your procedure except for Aspirin and Plavix with a small sip of water.   On the morning of your procedure, take your Aspirin and Plavix any morning medicines NOT listed above.  You may use sips of water.  5. Plan for one night stay--bring personal belongings. 6. Bring a current list of your  medications and current insurance cards. 7. You MUST have a responsible person to drive you home. 8. Someone MUST be with you the first 24 hours after you arrive home or your discharge will be delayed. 9. Please wear clothes that are easy to get on and off and wear slip-on shoes.  Thank you for allowing [place] to care for you!   --  Invasive Cardiovascular services

## 2019-08-12 NOTE — Progress Notes (Signed)
PCP: [name], MD Cardiology: [name] HF Cardiology: [name]  59 y.o. with history of CAD, ischemic cardiomyopathy, and PAD was referred by [name] for evaluation of CHF.  She had NSTEMI in 11/17 with PCI to prox/mid LCx and RCA.  Subsequently, has developed ischemic cardiomyopathy.  Most recent echo in 1/21 showed EF 30-35%.  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   She fatigues easily.  Main complaint seems to be episodes of central chest heaviness.  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  The chest heaviness does seem to be related to exertion. She takes NTG about twice a week.  She has been having the chest symptoms for about 2 months.  She is short of breath walking up stairs or walking to the mailbox (50-100 yards).  She has bilateral calf cramping, sometimes with walking but also at times at rest.  No lightheadedness. No orthopnea/PND.    ECG (personally reviewed): NSR, normal  Labs (2/21): K 3.7, creatinine 0.82  PMH: 1. CAD: NSTEMI with DES to proximal and mid LCx in 11/17, staged DES x 2 to RCA later in 11/17.  - Cardiolite (10/20): EF < 30%, large inferior and inferolateral MI with mild peri-infarct ischemia.  2. Chronic systolic CHF: Ischemic cardiomyopathy.  - Echo (10/20): EF 35-40%, lateral WMAs, normal RV. - Echo (1/21): EF 30-35%, mild LVH, normal RV 3. 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  4. Carotid stenosis: Carotid dopplers ([id number]) with [id number] LICA stenosis.  5. Active smoker.   Social History   Socioeconomic History  . Marital status: Married    Spouse name: Not on file  . Number of children: Not on file  . Years of education: Not on file  . Highest education level: Not on file  Occupational History  . Occupation: CNA  Tobacco Use  . Smoking status: Current Some Day Smoker    Packs/day: 0.10    Years: 15.00    Pack years: 1.50   Types: Cigarettes  . Smokeless tobacco: Never Used  . Tobacco comment: smokes a cigarette occasionally  Substance and Sexual Activity  . Alcohol use: Not Currently    Comment: occasionally  . Drug use: Not Currently    Types: Marijuana    Comment: former- 2017 last time  . Sexual activity: Not Currently    Birth control/protection: Surgical    Comment: hyst  Other Topics Concern  . Not on file  Social History Narrative   Lives with husband in [place] in a one story home with a basement.  Has 4 children.  Works as a Quarry manager.  Education: CNA school.    Social Determinants of Health   Financial Resource Strain:   . Difficulty of Paying Living Expenses:   Food Insecurity:   . Worried About Charity fundraiser in the Last Year:   . Arboriculturist in the Last Year:   Transportation Needs:   . Film/video editor (Medical):   [name] Kitchen Lack of Transportation (Non-Medical):   Physical Activity:   . Days of Exercise per Week:   . Minutes of Exercise per Session:   Stress:   . Feeling of Stress :   Social Connections:   . Frequency of Communication with Friends and Family:   . Frequency of Social Gatherings with Friends and Family:   . Attends Religious Services:   . Active Member of Clubs  or Organizations:   . Attends Archivist Meetings:   [name] Kitchen Marital Status:   Intimate Partner Violence:   . Fear of Current or Ex-Partner:   . Emotionally Abused:   [name] Kitchen Physically Abused:   . Sexually Abused:    Family History  Problem Relation Age of Onset  . Diabetes Father   . Hypertension Father   . CAD Father   . Colon polyps Father 60       pre-cancerous   . Stroke Father   . Dementia Father   . Stroke Mother   . Hypertension Mother   . Diabetes Mother   . Heart failure Other   . Breast cancer Maternal Grandmother   . Colon cancer Neg Hx    ROS: All systems reviewed and negative except as per HPI.   Current Outpatient Medications  Medication Sig Dispense Refill  .  acetaminophen (TYLENOL) 325 MG tablet Take 325 mg by mouth every 6 (six) hours as needed for mild pain or headache.     . ALPRAZolam (XANAX) 1 MG tablet Take 1-2 mg by mouth at bedtime as needed for anxiety.     [name] Kitchen aspirin EC 81 MG tablet Take 81 mg by mouth every evening.     [name] Kitchen atorvastatin (LIPITOR) 80 MG tablet TAKE 1 TABLET BY MOUTH ONCE DAILY AT  6  IN  THE  EVENING (Patient taking differently: Take 80 mg by mouth daily at 6 PM. ) 90 tablet 2  . clopidogrel (PLAVIX) 75 MG tablet Take 1 tablet (75 mg total) by mouth daily. (Patient taking differently: Take 37.5 mg by mouth in the morning and at bedtime. ) 90 tablet 2  . ezetimibe (ZETIA) 10 MG tablet Take 1 tablet by mouth once daily (Patient taking differently: Take 10 mg by mouth daily. ) 30 tablet 6  . furosemide (LASIX) 20 MG tablet Take 1 tablet by mouth once daily (Patient taking differently: Take 20 mg by mouth daily. ) 90 tablet 1  . gabapentin (NEURONTIN) 100 MG capsule Take 100 mg by mouth 2 (two) times daily.     [name] Kitchen loperamide (IMODIUM) 2 MG capsule Take 2 mg by mouth 4 (four) times daily as needed for diarrhea or loose stools (ibs).    . LORazepam (ATIVAN) 1 MG tablet Take 1 mg by mouth daily as needed for anxiety.   1  . Multiple Vitamins-Minerals (MULTIVITAMIN WITH MINERALS) tablet Take 1 tablet by mouth daily.    . nicotine (NICODERM CQ - DOSED IN MG/24 HOURS) 21 mg/24hr patch Place 21 mg onto the skin daily.    . nitroGLYCERIN (NITROSTAT) 0.4 MG SL tablet Place 1 tablet (0.4 mg total) under the tongue every 5 (five) minutes x 3 doses as needed for chest pain. 30 tablet 12  . potassium chloride SA (KLOR-CON) 20 MEQ tablet Take 1 tablet (20 mEq total) by mouth daily. 90 tablet 2  . rOPINIRole (REQUIP) 0.5 MG tablet Take 0.5 mg by mouth at bedtime.   0  . sacubitril-valsartan (ENTRESTO) 24-26 MG Take 1 tablet by mouth 2 (two) times daily. 180 tablet 1  . carvedilol (COREG) 3.125 MG tablet Take 1 tablet (3.125 mg total) by mouth 2 (two)  times daily with a meal. (Patient taking differently: Take 3.125 mg by mouth daily. ) 60 tablet 4  . spironolactone (ALDACTONE) 25 MG tablet Take 0.5 tablets (12.5 mg total) by mouth daily. 15 tablet 3  . zinc gluconate 50 MG tablet Take 50 mg by mouth  daily.     No current facility-administered medications for this encounter.   BP 130/82   Pulse 85   Wt 60.6 kg (133 lb 9.6 oz)   SpO2 100%   BMI 26.09 kg/m  General: NAD Neck: No JVD, no thyromegaly or thyroid nodule.  Lungs: Clear to auscultation bilaterally with normal respiratory effort. CV: Nondisplaced PMI.  Heart regular S1/S2, no S3/S4, no murmur.  No peripheral edema.  No carotid bruit.  Unable to palpate pedal pulses.  Abdomen: Soft, nontender, no hepatosplenomegaly, no distention.  Skin: Intact without lesions or rashes.  Neurologic: Alert and oriented x 3.  Psych: Normal affect. Extremities: No clubbing or cyanosis.  HEENT: Normal.   Assessment/Plan: 1. CAD: S/p NSTEMI in 11/17 with DEX to LCx and DES x 2 to RCA.  Cardiolite in 10/20 with EF <30%, inferior/inferolateral infarct with peri-infarct ischemia. She has been having exertional chest heaviness for about 2 months and has been taking NTG about twice a week.   - With significant exertional symptoms, I will arrange for coronary angiography.  We discussed risks/benefits and she agreed to procedure.  - Continue atorvastatin and Zetia.   - Continue ASA 81 and Plavix 75 mg daily for now.  She will likely be a good candidate to replace Plavix with rivaroxaban 2.5 mg bid in the future.  2. Chronic systolic CHF: Ischemic cardiomyopathy. Last echo in 1/21 with EF 30-35%.  NYHA class III symptoms.  She is not volume overloaded on exam.  - Continue Entresto. - Stop metoprolol tartrate, start Coreg 3.125 mg bid.  - Start spironolactone 12.5 mg daily.  BMET today and again in 10 days.  - Continue Lasix 20 mg daily.  - Narrow QRS so not CRT candidate.  - Repeat echo in 3-4 months  after medication titration and cath, she may need an ICD.  - Will plan RHC along with coronary angiography (see above).  3. PAD: Patient has symptoms consistent with claudication and pulses difficult to palpate.  - I will arrange for ABIs.  4. Carotid stenosis: Repeat carotid dopplers in 10/21.   5. Smoking: Strongly encouraged her to quit.  She is cutting back a lot and is using nicotine patches.  6. 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   Loralie Champagne 08/12/2019

## 2019-08-13 ENCOUNTER — Other Ambulatory Visit (HOSPITAL_COMMUNITY): Payer: Self-pay | Admitting: Cardiology

## 2019-08-13 DIAGNOSIS — I739 Peripheral vascular disease, unspecified: Secondary | ICD-10-CM

## 2019-08-14 ENCOUNTER — Other Ambulatory Visit: Payer: Self-pay

## 2019-08-14 ENCOUNTER — Ambulatory Visit (HOSPITAL_COMMUNITY)
Admission: RE | Admit: 2019-08-14 | Discharge: 2019-08-14 | Disposition: A | Discharge status: Home / Self Care | Payer: Self-pay | Source: Ambulatory Visit | Attending: Cardiology | Admitting: Cardiology

## 2019-08-14 DIAGNOSIS — I739 Peripheral vascular disease, unspecified: Secondary | ICD-10-CM

## 2019-08-16 ENCOUNTER — Other Ambulatory Visit (HOSPITAL_COMMUNITY)
Admission: RE | Admit: 2019-08-16 | Discharge: 2019-08-16 | Disposition: A | Discharge status: Home / Self Care | Payer: No Typology Code available for payment source | Source: Ambulatory Visit | Attending: Cardiology | Admitting: Cardiology

## 2019-08-16 DIAGNOSIS — Z01812 Encounter for preprocedural laboratory examination: Secondary | ICD-10-CM | POA: Insufficient documentation

## 2019-08-16 DIAGNOSIS — Z20822 Contact with and (suspected) exposure to covid-19: Secondary | ICD-10-CM | POA: Insufficient documentation

## 2019-08-16 LAB — SARS CORONAVIRUS 2 (TAT 6-24 HRS): SARS Coronavirus 2: NEGATIVE

## 2019-08-19 ENCOUNTER — Encounter (HOSPITAL_COMMUNITY)
Admission: RE | Disposition: A | Discharge status: Home / Self Care | Payer: Self-pay | Source: Home / Self Care | Attending: Cardiology

## 2019-08-19 ENCOUNTER — Ambulatory Visit (HOSPITAL_COMMUNITY)
Admission: RE | Admit: 2019-08-19 | Discharge: 2019-08-19 | Disposition: A | Discharge status: Home / Self Care | Payer: No Typology Code available for payment source | Attending: Cardiology | Admitting: Cardiology

## 2019-08-19 ENCOUNTER — Encounter: Payer: Self-pay | Admitting: *Deleted

## 2019-08-19 ENCOUNTER — Other Ambulatory Visit: Payer: Self-pay

## 2019-08-19 DIAGNOSIS — Z006 Encounter for examination for normal comparison and control in clinical research program: Secondary | ICD-10-CM

## 2019-08-19 DIAGNOSIS — Z7982 Long term (current) use of aspirin: Secondary | ICD-10-CM | POA: Diagnosis not present

## 2019-08-19 DIAGNOSIS — I252 Old myocardial infarction: Secondary | ICD-10-CM | POA: Insufficient documentation

## 2019-08-19 DIAGNOSIS — I6522 Occlusion and stenosis of left carotid artery: Secondary | ICD-10-CM | POA: Diagnosis not present

## 2019-08-19 DIAGNOSIS — Z8669 Personal history of other diseases of the nervous system and sense organs: Secondary | ICD-10-CM | POA: Insufficient documentation

## 2019-08-19 DIAGNOSIS — R0789 Other chest pain: Secondary | ICD-10-CM | POA: Diagnosis not present

## 2019-08-19 DIAGNOSIS — I5042 Chronic combined systolic (congestive) and diastolic (congestive) heart failure: Secondary | ICD-10-CM

## 2019-08-19 DIAGNOSIS — F17210 Nicotine dependence, cigarettes, uncomplicated: Secondary | ICD-10-CM | POA: Insufficient documentation

## 2019-08-19 DIAGNOSIS — Z7902 Long term (current) use of antithrombotics/antiplatelets: Secondary | ICD-10-CM | POA: Diagnosis not present

## 2019-08-19 DIAGNOSIS — Z79899 Other long term (current) drug therapy: Secondary | ICD-10-CM | POA: Insufficient documentation

## 2019-08-19 DIAGNOSIS — I739 Peripheral vascular disease, unspecified: Secondary | ICD-10-CM | POA: Diagnosis not present

## 2019-08-19 DIAGNOSIS — Z8249 Family history of ischemic heart disease and other diseases of the circulatory system: Secondary | ICD-10-CM | POA: Insufficient documentation

## 2019-08-19 DIAGNOSIS — I5022 Chronic systolic (congestive) heart failure: Secondary | ICD-10-CM | POA: Diagnosis not present

## 2019-08-19 DIAGNOSIS — I255 Ischemic cardiomyopathy: Secondary | ICD-10-CM | POA: Diagnosis not present

## 2019-08-19 DIAGNOSIS — I509 Heart failure, unspecified: Secondary | ICD-10-CM

## 2019-08-19 HISTORY — PX: RIGHT/LEFT HEART CATH AND CORONARY ANGIOGRAPHY: CATH118266

## 2019-08-19 LAB — POCT I-STAT EG7
Acid-base deficit: 2 mmol/L (ref 0.0–2.0)
Acid-base deficit: 2 mmol/L (ref 0.0–2.0)
Bicarbonate: 23.5 mmol/L (ref 20.0–28.0)
Bicarbonate: 23.7 mmol/L (ref 20.0–28.0)
Calcium, Ion: 1.27 mmol/L (ref 1.15–1.40)
Calcium, Ion: 1.32 mmol/L (ref 1.15–1.40)
HCT: 34 % — ABNORMAL LOW (ref 36.0–46.0)
HCT: 35 % — ABNORMAL LOW (ref 36.0–46.0)
Hemoglobin: 11.6 g/dL — ABNORMAL LOW (ref 12.0–15.0)
Hemoglobin: 11.9 g/dL — ABNORMAL LOW (ref 12.0–15.0)
O2 Saturation: 72 %
O2 Saturation: 72 %
Potassium: 4.4 mmol/L (ref 3.5–5.1)
Potassium: 4.6 mmol/L (ref 3.5–5.1)
Sodium: 138 mmol/L (ref 135–145)
Sodium: 139 mmol/L (ref 135–145)
TCO2: 25 mmol/L (ref 22–32)
TCO2: 25 mmol/L (ref 22–32)
pCO2, Ven: 43.1 mmHg — ABNORMAL LOW (ref 44.0–60.0)
pCO2, Ven: 43.5 mmHg — ABNORMAL LOW (ref 44.0–60.0)
pH, Ven: 7.344 (ref 7.250–7.430)
pH, Ven: 7.345 (ref 7.250–7.430)
pO2, Ven: 40 mmHg (ref 32.0–45.0)
pO2, Ven: 40 mmHg (ref 32.0–45.0)

## 2019-08-19 SURGERY — RIGHT/LEFT HEART CATH AND CORONARY ANGIOGRAPHY
Anesthesia: LOCAL

## 2019-08-19 MED ORDER — SODIUM CHLORIDE 0.9% FLUSH: 3.0000 mL | INTRAVENOUS | Status: DC | PRN

## 2019-08-19 MED ORDER — HEPARIN (PORCINE) IN NACL 1000-0.9 UT/500ML-% IV SOLN
INTRAVENOUS | Status: DC | PRN
  Administered 2019-08-19 (×2): 500 mL

## 2019-08-19 MED ORDER — HEPARIN SODIUM (PORCINE) 1000 UNIT/ML IJ SOLN
INTRAMUSCULAR | Status: DC | PRN
  Administered 2019-08-19: 3000 [IU] via INTRAVENOUS

## 2019-08-19 MED ORDER — VERAPAMIL HCL 2.5 MG/ML IV SOLN
INTRAVENOUS | Status: AC
  Filled 2019-08-19: qty 2

## 2019-08-19 MED ORDER — HEPARIN (PORCINE) IN NACL 1000-0.9 UT/500ML-% IV SOLN
INTRAVENOUS | Status: AC
  Filled 2019-08-19: qty 500

## 2019-08-19 MED ORDER — FENTANYL CITRATE (PF) 100 MCG/2ML IJ SOLN
INTRAMUSCULAR | Status: AC
  Filled 2019-08-19: qty 2

## 2019-08-19 MED ORDER — SODIUM CHLORIDE 0.9% FLUSH: 3.0000 mL | Freq: Two times a day (BID) | INTRAVENOUS | Status: DC

## 2019-08-19 MED ORDER — ACETAMINOPHEN 325 MG PO TABS
650.0000 mg | ORAL_TABLET | ORAL | Status: DC | PRN
  Administered 2019-08-19: 650 mg via ORAL
  Filled 2019-08-19: qty 2

## 2019-08-19 MED ORDER — SODIUM CHLORIDE 0.9 % IV SOLN: INTRAVENOUS | Status: DC

## 2019-08-19 MED ORDER — SODIUM CHLORIDE 0.9 % IV SOLN: 250.0000 mL | INTRAVENOUS | Status: DC | PRN

## 2019-08-19 MED ORDER — LIDOCAINE HCL (PF) 1 % IJ SOLN
INTRAMUSCULAR | Status: AC
  Filled 2019-08-19: qty 30

## 2019-08-19 MED ORDER — IOHEXOL 350 MG/ML SOLN
INTRAVENOUS | Status: DC | PRN
  Administered 2019-08-19: 35 mL via INTRA_ARTERIAL

## 2019-08-19 MED ORDER — SODIUM CHLORIDE 0.9 % IV SOLN: Freq: Once | INTRAVENOUS | Status: DC

## 2019-08-19 MED ORDER — HEPARIN SODIUM (PORCINE) 1000 UNIT/ML IJ SOLN
INTRAMUSCULAR | Status: AC
  Filled 2019-08-19: qty 1

## 2019-08-19 MED ORDER — LABETALOL HCL 5 MG/ML IV SOLN: 10.0000 mg | INTRAVENOUS | Status: DC | PRN

## 2019-08-19 MED ORDER — ONDANSETRON HCL 4 MG/2ML IJ SOLN: 4.0000 mg | Freq: Four times a day (QID) | INTRAMUSCULAR | Status: DC | PRN

## 2019-08-19 MED ORDER — MIDAZOLAM HCL 2 MG/2ML IJ SOLN
INTRAMUSCULAR | Status: AC
  Filled 2019-08-19: qty 2

## 2019-08-19 MED ORDER — VERAPAMIL HCL 2.5 MG/ML IV SOLN
INTRAVENOUS | Status: DC | PRN
  Administered 2019-08-19: 10 mL via INTRA_ARTERIAL

## 2019-08-19 MED ORDER — SODIUM CHLORIDE 0.9 % IV SOLN: INTRAVENOUS | Status: AC

## 2019-08-19 MED ORDER — MIDAZOLAM HCL 2 MG/2ML IJ SOLN
INTRAMUSCULAR | Status: DC | PRN
  Administered 2019-08-19 (×2): 1 mg via INTRAVENOUS

## 2019-08-19 MED ORDER — HYDRALAZINE HCL 20 MG/ML IJ SOLN: 10.0000 mg | INTRAMUSCULAR | Status: DC | PRN

## 2019-08-19 MED ORDER — FENTANYL CITRATE (PF) 100 MCG/2ML IJ SOLN
INTRAMUSCULAR | Status: DC | PRN
  Administered 2019-08-19 (×2): 25 ug via INTRAVENOUS

## 2019-08-19 MED ORDER — LIDOCAINE HCL (PF) 1 % IJ SOLN
INTRAMUSCULAR | Status: DC | PRN
  Administered 2019-08-19: 5 mL

## 2019-08-19 SURGICAL SUPPLY — 14 items
CATH 5FR JL3.5 JR4 ANG PIG MP (CATHETERS) ×1 IMPLANT
CATH BALLN WEDGE 5F 110CM (CATHETERS) ×1 IMPLANT
DEVICE RAD COMP TR BAND LRG (VASCULAR PRODUCTS) ×1 IMPLANT
GLIDESHEATH SLEND SS 6F .021 (SHEATH) ×1 IMPLANT
GUIDEWIRE INQWIRE 1.5J.035X260 (WIRE) IMPLANT
INQWIRE 1.5J .035X260CM (WIRE) ×2
KIT HEART LEFT (KITS) ×2 IMPLANT
PACK CARDIAC CATHETERIZATION (CUSTOM PROCEDURE TRAY) ×2 IMPLANT
SHEATH GLIDE SLENDER 4/5FR (SHEATH) ×1 IMPLANT
SHEATH PROBE COVER 6X72 (BAG) ×1 IMPLANT
TRANSDUCER W/STOPCOCK (MISCELLANEOUS) ×2 IMPLANT
WIRE EMERALD 3MM-J .025X260CM (WIRE) ×1 IMPLANT
WIRE HI TORQ VERSACORE-J 145CM (WIRE) ×1 IMPLANT
WIRE MICROINTRODUCER 60CM (WIRE) ×1 IMPLANT

## 2019-08-19 NOTE — Progress Notes (Signed)
Amy, FNP  Paged related to BP

## 2019-08-19 NOTE — Interval H&P Note (Signed)
History and Physical Interval Note:  08/19/2019 7:55 AM  Erin [name]  has presented today for surgery, with the diagnosis of heart failure.  The various methods of treatment have been discussed with the patient and family. After consideration of risks, benefits and other options for treatment, the patient has consented to  Procedure(s): RIGHT/LEFT HEART CATH AND CORONARY ANGIOGRAPHY (N/A) as a surgical intervention.  The patient's history has been reviewed, patient examined, no change in status, stable for surgery.  I have reviewed the patient's chart and labs.  Questions were answered to the patient's satisfaction.     Erin [name] [employer]

## 2019-08-19 NOTE — Discharge Instructions (Signed)
Radial Site Care  This sheet gives you information about how to care for yourself after your procedure. Your health care provider may also give you more specific instructions. If you have problems or questions, contact your health care provider. What can I expect after the procedure? After the procedure, it is common to have:  Bruising and tenderness at the catheter insertion area. Follow these instructions at home: Medicines  Take over-the-counter and prescription medicines only as told by your health care provider. Insertion site care  Follow instructions from your health care provider about how to take care of your insertion site. Make sure you: ? Wash your hands with soap and water before you change your bandage (dressing). If soap and water are not available, use hand sanitizer. ? Change your dressing as told by your health care provider. ? Leave stitches (sutures), skin glue, or adhesive strips in place. These skin closures may need to stay in place for 2 weeks or longer. If adhesive strip edges start to loosen and curl up, you may trim the loose edges. Do not remove adhesive strips completely unless your health care provider tells you to do that.  Check your insertion site every day for signs of infection. Check for: ? Redness, swelling, or pain. ? Fluid or blood. ? Pus or a bad smell. ? Warmth.  Do not take baths, swim, or use a hot tub until your health care provider approves.  You may shower 24-48 hours after the procedure, or as directed by your health care provider. ? Remove the dressing and gently wash the site with plain soap and water. ? Pat the area dry with a clean towel. ? Do not rub the site. That could cause bleeding.  Do not apply powder or lotion to the site. Activity   For 24 hours after the procedure, or as directed by your health care provider: ? Do not flex or bend the affected arm. ? Do not push or pull heavy objects with the affected arm. ? Do not  drive yourself home from the hospital or clinic. You may drive 24 hours after the procedure unless your health care provider tells you not to. ? Do not operate machinery or power tools.  Do not lift anything that is heavier than 10 lb (4.5 kg), or the limit that you are told, until your health care provider says that it is safe.  Ask your health care provider when it is okay to: ? Return to work or school. ? Resume usual physical activities or sports. ? Resume sexual activity. General instructions  If the catheter site starts to bleed, raise your arm and put firm pressure on the site. If the bleeding does not stop, get help right away. This is a medical emergency.  If you went home on the same day as your procedure, a responsible adult should be with you for the first 24 hours after you arrive home.  Keep all follow-up visits as told by your health care provider. This is important. Contact a health care provider if:  You have a fever.  You have redness, swelling, or yellow drainage around your insertion site. Get help right away if:  You have unusual pain at the radial site.  The catheter insertion area swells very fast.  The insertion area is bleeding, and the bleeding does not stop when you hold steady pressure on the area.  Your arm or hand becomes pale, cool, tingly, or numb. These symptoms may represent a serious problem   that is an emergency. Do not wait to see if the symptoms will go away. Get medical help right away. Call your local emergency services (911 in the [place] Do not drive yourself to the hospital. Summary  After the procedure, it is common to have bruising and tenderness at the site.  Follow instructions from your health care provider about how to take care of your radial site wound. Check the wound every day for signs of infection.  Do not lift anything that is heavier than 10 lb (4.5 kg), or the limit that you are told, until your health care provider says  that it is safe. This information is not intended to replace advice given to you by your health care provider. Make sure you discuss any questions you have with your health care provider. Document Revised: 05/31/2017 Document Reviewed: 05/31/2017 Elsevier Patient Education  2020 Elsevier Inc[employer]  

## 2019-08-19 NOTE — Research (Signed)
[facility] Informed Consent                  Subject Name:   Erin [name]   Subject met inclusion and exclusion criteria.  The informed consent form, study requirements and expectations were reviewed with the subject and questions and concerns were addressed prior to the signing of the consent form.  The subject verbalized understanding of the trial requirements.  The subject agreed to participate in the [facility] trial and signed the informed consent.  The informed consent was obtained prior to performance of any protocol-specific procedures for the subject.  A copy of the signed informed consent was given to the subject and a copy was placed in the subject's medical record.   [place] Chalmers, Research Assistant  08/19/2019 06:54 a.m.

## 2019-08-19 NOTE — Progress Notes (Signed)
Discharge instructions reviewed with pt husband voices understanding.  

## 2019-08-19 NOTE — Progress Notes (Signed)
Darrick Grinder, FNP called and updated on BP states ok to discharge pt

## 2019-08-19 NOTE — Progress Notes (Addendum)
Darrick Grinder, FNP informed of BP new orders noted  Pt husband called and updated

## 2019-08-19 NOTE — Progress Notes (Signed)
Attempted to call pt husband message left for him to call me back. Discharge  instructions reviewed with Pt voices understanding.

## 2019-08-22 ENCOUNTER — Telehealth: Payer: No Typology Code available for payment source | Admitting: Cardiovascular Disease

## 2019-08-23 ENCOUNTER — Encounter: Payer: Self-pay | Admitting: Cardiovascular Disease

## 2019-08-26 ENCOUNTER — Telehealth (HOSPITAL_COMMUNITY): Payer: Self-pay | Admitting: *Deleted

## 2019-08-26 NOTE — Telephone Encounter (Signed)
Pt left VM stating she needed to speak with a nurse about her arm. She had a cath on 4/12. I called pt back to get more information I was told patient was not in and to try her again later. I also called her mobile number and no one answered.

## 2019-08-28 ENCOUNTER — Telehealth (INDEPENDENT_AMBULATORY_CARE_PROVIDER_SITE_OTHER): Payer: Self-pay | Admitting: Cardiovascular Disease

## 2019-08-28 ENCOUNTER — Encounter: Payer: Self-pay | Admitting: Cardiovascular Disease

## 2019-08-28 VITALS — BP 117/76 | HR 91 | Ht 60.0 in | Wt 133.0 lb

## 2019-08-28 DIAGNOSIS — I10 Essential (primary) hypertension: Secondary | ICD-10-CM

## 2019-08-28 DIAGNOSIS — I6523 Occlusion and stenosis of bilateral carotid arteries: Secondary | ICD-10-CM

## 2019-08-28 DIAGNOSIS — I5042 Chronic combined systolic (congestive) and diastolic (congestive) heart failure: Secondary | ICD-10-CM | POA: Diagnosis not present

## 2019-08-28 DIAGNOSIS — E785 Hyperlipidemia, unspecified: Secondary | ICD-10-CM

## 2019-08-28 DIAGNOSIS — I671 Cerebral aneurysm, nonruptured: Secondary | ICD-10-CM

## 2019-08-28 DIAGNOSIS — I25708 Atherosclerosis of coronary artery bypass graft(s), unspecified, with other forms of angina pectoris: Secondary | ICD-10-CM | POA: Diagnosis not present

## 2019-08-28 DIAGNOSIS — Z955 Presence of coronary angioplasty implant and graft: Secondary | ICD-10-CM

## 2019-08-28 NOTE — Patient Instructions (Signed)
Medication Instructions:  Your physician recommends that you continue on your current medications as directed. Please refer to the Current Medication list given to you today.  *If you need a refill on your cardiac medications before your next appointment, please call your pharmacy*   Lab Work: None today If you have labs (blood work) drawn today and your tests are completely normal, you will receive your results only by: [name] Kitchen MyChart Message (if you have MyChart) OR . A paper copy in the mail If you have any lab test that is abnormal or we need to change your treatment, we will call you to review the results.   Testing/Procedures: None today   Follow-Up: At [facility], you and your health needs are our priority.  As part of our continuing mission to provide you with exceptional heart care, we have created designated Provider Care Teams.  These Care Teams include your primary Cardiologist (physician) and Advanced Practice Providers (APPs -  Physician Assistants and Nurse Practitioners) who all work together to provide you with the care you need, when you need it.  We recommend signing up for the patient portal called "MyChart".  Sign up information is provided on this After Visit Summary.  MyChart is used to connect with patients for Virtual Visits (Telemedicine).  Patients are able to view lab/test results, encounter notes, upcoming appointments, etc.  Non-urgent messages can be sent to your provider as well.   To learn more about what you can do with MyChart, go to [email or web address].    Your next appointment:   6 months  The format for your next appointment:   Office visit  Provider:   Dr.Koneswaran   Other Instructions None        Thank you for choosing [facility] !

## 2019-08-28 NOTE — Progress Notes (Signed)
Virtual Visit via Telephone Note   This visit type was conducted due to national recommendations for restrictions regarding the COVID-19 Pandemic (e.g. social distancing) in an effort to limit this patient's exposure and mitigate transmission in our community.  Due to her co-morbid illnesses, this patient is at least at moderate risk for complications without adequate follow up.  This format is felt to be most appropriate for this patient at this time.  The patient did not have access to video technology/had technical difficulties with video requiring transitioning to audio format only (telephone).  All issues noted in this document were discussed and addressed.  No physical exam could be performed with this format.  Please refer to the patient's chart for her  consent to telehealth for [facility].   The patient was identified using 2 identifiers.  Date:  08/28/2019   ID:  Erin [name], DOB June 27, 1960, MRN [id number]  Patient Location: Home Provider Location: Office  PCP:  [name], MD  Cardiologist:  Kate Sable, MD  Electrophysiologist:  None   Evaluation Performed:  Follow-Up Visit  Chief Complaint:  CAD, CHF  History of Present Illness:    Erin [name] is a 59 y.o. female with past medical history of CAD (s/p DES to LCx with staged PCI/DESx2 to RCA/PDA in 03/2016, chronic combined systolic and diastolic CHF,HTN,andHLD.  She was evaluated in the office by [name] on 08/12/2019 in the advanced heart failure clinic.  Coronary angiography on 08/19/2019 demonstrated patent stents in the proximal left circumflex and in the PDA.  Echocardiogram on 03/07/2019 demonstrated moderately induced LV systolic function, LVEF 35 to 40%.  There was grade 2 diastolic dysfunction wall motion abnormalities.  Carotid Dopplers on 02/18/2019 showed 60 to 79% left ICA stenosis.  She denies exertional angina.  She has episodic chest pains which she now believes are due to stress  after work.  She only has palpitations briefly after work.  She denies leg swelling, orthopnea, and paroxysmal nocturnal dyspnea.   Past Medical History:  Diagnosis Date  . Anxiety state 03/25/2016  . Basal cell carcinoma of forehead   . CHF (congestive heart failure) ([place])   . Coronary artery disease    a. 03/11/16 PCI with DES-->Prox/Mid Cx;  b. 03/14/16 PCI with DES x2-->RCA, EF 30-35%.  . Essential hypertension   . GERD (gastroesophageal reflux disease)   . HFrEF (heart failure with reduced ejection fraction) ([place])    a. 10/2016 Echo: EF 35-40%, Gr1 DD, mild focal basal septal hypertrophy, basal inflat, mid inflat, basal antlat AK. Mid infept/inf/antlat, apical lateral sev HK. Mod MR. mildly reduced RV fxn. Mild TR.  [name] Kitchen History of pneumonia   . Hyperlipidemia   . IBS (irritable bowel syndrome)    diarrhea  . Ischemic cardiomyopathy    a. 10/2016 Echo: EF 35-40%, Gr1 DD.  [name] Kitchen Mitral regurgitation   . NSTEMI (non-ST elevated myocardial infarction) ([place]) 03/10/2016  . Pneumonia 03/2016  . Squamous cell cancer of skin of nose   . Thrombocytosis ([place]) 03/26/2016  . Tobacco abuse   . Trichimoniasis   . Wears dentures   . Wears glasses    Past Surgical History:  Procedure Laterality Date  . APPENDECTOMY    . BIOPSY  09/20/2018   Procedure: BIOPSY;  Surgeon: Daneil Dolin, MD;  [facility];  Service: Endoscopy;;  colon  . CARDIAC CATHETERIZATION N/A 03/11/2016   Procedure: Left Heart Cath and Coronary Angiography;  Surgeon: Leonie Man, MD;  Location: [place] [facility];  Service: Cardiovascular;  Laterality: N/A;  . CARDIAC CATHETERIZATION N/A 03/11/2016   Procedure: Coronary Stent Intervention;  Surgeon: Leonie Man, MD;  Location: [place] [facility];  Service: Cardiovascular;  Laterality: N/A;  . CARDIAC CATHETERIZATION N/A 03/14/2016   Procedure: Coronary Stent Intervention;  Surgeon: Peter M Martinique, MD;  Location: [place] [facility];  Service: Cardiovascular;   Laterality: N/A;  . CHOLECYSTECTOMY OPEN  1984  . COLONOSCOPY WITH PROPOFOL N/A 09/20/2018   Procedure: COLONOSCOPY WITH PROPOFOL;  Surgeon: Daneil Dolin, MD;  [facility];  Service: Endoscopy;  Laterality: N/A;  10:30am  . CORONARY ANGIOPLASTY WITH STENT PLACEMENT  03/14/2016  . ESOPHAGOGASTRODUODENOSCOPY (EGD) WITH PROPOFOL N/A 09/20/2018   Procedure: ESOPHAGOGASTRODUODENOSCOPY (EGD) WITH PROPOFOL;  Surgeon: Daneil Dolin, MD;  [facility];  Service: Endoscopy;  Laterality: N/A;  . FINGER ARTHROPLASTY Left 05/14/2013   Procedure: LEFT THUMB CARPAL METACARPAL ARTHROPLASTY;  Surgeon: Tennis Must, MD;  Location: [facility];  Service: Orthopedics;  Laterality: Left;  . IR ANGIO INTRA EXTRACRAN SEL COM CAROTID INNOMINATE BILAT MOD SED  01/05/2017  . IR ANGIO INTRA EXTRACRAN SEL COM CAROTID INNOMINATE BILAT MOD SED  03/19/2019  . IR ANGIO INTRA EXTRACRAN SEL INTERNAL CAROTID UNI L MOD SED  04/15/2019  . IR ANGIO VERTEBRAL SEL VERTEBRAL BILAT MOD SED  01/05/2017  . IR ANGIO VERTEBRAL SEL VERTEBRAL BILAT MOD SED  03/19/2019  . IR ANGIOGRAM FOLLOW UP STUDY  04/15/2019  . IR RADIOLOGIST EVAL & MGMT  12/30/2016  . IR TRANSCATH/EMBOLIZ  04/15/2019  . IR US GUIDE VASC ACCESS RIGHT  03/19/2019  . MALONEY DILATION N/A 09/20/2018   Procedure: Venia Minks DILATION;  Surgeon: Daneil Dolin, MD;  [facility];  Service: Endoscopy;  Laterality: N/A;  . RADIOLOGY WITH ANESTHESIA N/A 04/15/2019   Procedure: [employer];  Surgeon: Luanne Bras, MD;  Location: [place];  Service: Radiology;  Laterality: N/A;  . RIGHT/LEFT HEART CATH AND CORONARY ANGIOGRAPHY N/A 08/19/2019   Procedure: RIGHT/LEFT HEART CATH AND CORONARY ANGIOGRAPHY;  Surgeon: Larey Dresser, MD;  Location: [place] [facility];  Service: Cardiovascular;  Laterality: N/A;  . TUBAL LIGATION  1987  . VAGINAL HYSTERECTOMY  2009     Current Meds  Medication Sig  . acetaminophen (TYLENOL) 325 MG tablet  Take 325 mg by mouth every 6 (six) hours as needed for mild pain or headache.   . ALPRAZolam (XANAX) 1 MG tablet Take 1-2 mg by mouth at bedtime as needed for anxiety.   [name] Kitchen aspirin EC 81 MG tablet Take 81 mg by mouth every evening.   [name] Kitchen atorvastatin (LIPITOR) 80 MG tablet TAKE 1 TABLET BY MOUTH ONCE DAILY AT  6  IN  THE  EVENING (Patient taking differently: Take 80 mg by mouth daily at 6 PM. )  . carvedilol (COREG) 3.125 MG tablet Take 1 tablet (3.125 mg total) by mouth 2 (two) times daily with a meal. (Patient taking differently: Take 3.125 mg by mouth daily. )  . clopidogrel (PLAVIX) 75 MG tablet Take 1 tablet (75 mg total) by mouth daily. (Patient taking differently: Take 37.5 mg by mouth in the morning and at bedtime. )  . ezetimibe (ZETIA) 10 MG tablet Take 1 tablet by mouth once daily (Patient taking differently: Take 10 mg by mouth daily. )  . furosemide (LASIX) 20 MG tablet Take 1 tablet by mouth once daily (Patient taking differently: Take 20 mg  by mouth daily. )  . gabapentin (NEURONTIN) 100 MG capsule Take 100 mg by mouth 2 (two) times daily.   [name] Kitchen loperamide (IMODIUM) 2 MG capsule Take 2 mg by mouth 4 (four) times daily as needed for diarrhea or loose stools (ibs).  . LORazepam (ATIVAN) 1 MG tablet Take 1 mg by mouth daily as needed for anxiety.   . Multiple Vitamins-Minerals (MULTIVITAMIN WITH MINERALS) tablet Take 1 tablet by mouth daily.  . nicotine (NICODERM CQ - DOSED IN MG/24 HOURS) 21 mg/24hr patch Place 21 mg onto the skin daily.  . nitroGLYCERIN (NITROSTAT) 0.4 MG SL tablet Place 1 tablet (0.4 mg total) under the tongue every 5 (five) minutes x 3 doses as needed for chest pain.  . potassium chloride SA (KLOR-CON) 20 MEQ tablet Take 1 tablet (20 mEq total) by mouth daily.  [name] Kitchen rOPINIRole (REQUIP) 0.5 MG tablet Take 0.5 mg by mouth at bedtime.   . sacubitril-valsartan (ENTRESTO) 24-26 MG Take 1 tablet by mouth 2 (two) times daily.  [name] Kitchen spironolactone (ALDACTONE) 25 MG tablet Take 0.5  tablets (12.5 mg total) by mouth daily.  [name] Kitchen zinc gluconate 50 MG tablet Take 50 mg by mouth daily.     Allergies:   Tape   Social History   Tobacco Use  . Smoking status: Current Some Day Smoker    Packs/day: 0.10    Years: 15.00    Pack years: 1.50    Types: Cigarettes  . Smokeless tobacco: Never Used  . Tobacco comment: smokes a cigarette occasionally  Substance Use Topics  . Alcohol use: Not Currently    Comment: occasionally  . Drug use: Not Currently    Types: Marijuana    Comment: former- 2017 last time     Family Hx: The patient's family history includes Breast cancer in her maternal grandmother; CAD in her father; Colon polyps (age of onset: 49) in her father; Dementia in her father; Diabetes in her father and mother; Heart failure in an other family member; Hypertension in her father and mother; Stroke in her father and mother. There is no history of Colon cancer.  ROS:   Please see the history of present illness.     All other systems reviewed and are negative.   Prior CV studies:   The following studies were reviewed today:  Cardiac catheterization conclusions 08/19/2019:  1. Normal filling pressures.  2. Preserved cardiac output.  3. No obstructive CAD, patent stents.  No explanation for chest pain, ?vasospasm.    Echo 05/24/19:  1. Left ventricular ejection fraction, by visual estimation, is 30 to  35%. The left ventricle has moderately decreased function. There is mildly  increased left ventricular hypertrophy.  2. Elevated left atrial pressure.  3. Left ventricular diastolic parameters are consistent with Grade I  diastolic dysfunction (impaired relaxation).  4. The left ventricle demonstrates global hypokinesis.  5. Global right ventricle has normal systolic function.The right  ventricular size is normal. No increase in right ventricular wall  thickness.  6. Left atrial size was normal.  7. Right atrial size was normal.  8. The mitral valve  is normal in structure. Trivial mitral valve  regurgitation. No evidence of mitral stenosis.  9. The tricuspid valve is normal in structure.  10. The aortic valve is tricuspid. Aortic valve regurgitation is not  visualized. No evidence of aortic valve sclerosis or stenosis.  11. The pulmonic valve was not well visualized. Pulmonic valve  regurgitation is not visualized.  12. The inferior vena  cava is normal in size with greater than 50%  respiratory variability, suggesting right atrial pressure of 3 mmHg.   Normal ABIs on 08/16/2019  Labs/Other Tests and Data Reviewed:    EKG:  No ECG reviewed.  Recent Labs: 05/10/2019: ALT 21 06/25/2019: B Natriuretic Peptide 80.0 08/12/2019: BUN 14; Creatinine, Ser 0.81; Platelets 407 08/19/2019: Hemoglobin 11.6; Potassium 4.4; Sodium 139   Recent Lipid Panel Lab Results  Component Value Date/Time   CHOL 179 03/19/2017 06:43 AM   TRIG 245 (H) 03/19/2017 06:43 AM   HDL 49 03/19/2017 06:43 AM   CHOLHDL 3.7 03/19/2017 06:43 AM   LDLCALC 81 03/19/2017 06:43 AM    Wt Readings from Last 3 Encounters:  08/28/19 133 lb (60.3 kg)  08/19/19 133 lb (60.3 kg)  08/12/19 133 lb 9.6 oz (60.6 kg)     Objective:    Vital Signs:  BP 117/76   Pulse 91   Ht 5' (1.524 m)   Wt 133 lb (60.3 kg)   BMI 25.97 kg/m    VITAL SIGNS:  reviewed  ASSESSMENT & PLAN:    1. Coronary artery disease: Symptomatically stable with patent coronary artery stents by most recent coronary angiogram on 08/19/2019.  Currently on aspirin,atorvastatin,carvedilol 3.125 mg and Zetia.    LVEF 30 to 35% by most recent echocardiogram.  2. Hypertension: Blood pressure is  controlled.  No changes.  3. Hyperlipidemia:Followed by PCP.Continue atorvastatin 80 mg and Zetia.  4. Cardiomyopathy/chronic combined systolic and diastolic heart failure:Currently on carvedilol, spironolactone 12.5 mg daily and Entresto 24-26 mg twice daily. She takes Lasix 20 mg daily. LVEF  30 to  35% by most recent echocardiogram. She has been instructed to take an extra 20 mg of Lasix for weight increase of 3 pounds in 24 hours.  Plans are for follow-up echocardiogram within the next few months.  She may require an ICD.  5. Left internal carotid artery stenosis: 40 to 59% right ICA stenosis and 60 to 79% left ICA stenosis by Dopplers on 07/06/2019. This is being followed by Dr. Estanislado Pandy.  On aspirin and statin.  Also on clopidogrel.  6.  PCOM aneurysm: Status post embolization by interventional radiology in December 2020.  On clopidogrel.    COVID-19 Education: The signs and symptoms of COVID-19 were discussed with the patient and how to seek care for testing (follow up with PCP or arrange E-visit).  The importance of social distancing was discussed today.  Time:   Today, I have spent 20 minutes with the patient with telehealth technology discussing the above problems.     Medication Adjustments/Labs and Tests Ordered: Current medicines are reviewed at length with the patient today.  Concerns regarding medicines are outlined above.   Tests Ordered: No orders of the defined types were placed in this encounter.   Medication Changes: No orders of the defined types were placed in this encounter.   Follow Up:  In Person in 6 month(s) with me.  Follow-up with [name] on 4/27 as scheduled.  Signed, Kate Sable, MD  08/28/2019 1:23 PM    [facility]

## 2019-09-03 ENCOUNTER — Encounter (HOSPITAL_COMMUNITY): Payer: Self-pay | Admitting: Cardiology

## 2019-09-03 ENCOUNTER — Other Ambulatory Visit: Payer: Self-pay

## 2019-09-03 ENCOUNTER — Ambulatory Visit (HOSPITAL_COMMUNITY)
Admission: RE | Admit: 2019-09-03 | Discharge: 2019-09-03 | Disposition: A | Discharge status: Home / Self Care | Payer: Self-pay | Source: Ambulatory Visit | Attending: Cardiology | Admitting: Cardiology

## 2019-09-03 VITALS — BP 122/104 | HR 84 | Wt 133.4 lb

## 2019-09-03 DIAGNOSIS — Z7902 Long term (current) use of antithrombotics/antiplatelets: Secondary | ICD-10-CM | POA: Diagnosis not present

## 2019-09-03 DIAGNOSIS — I252 Old myocardial infarction: Secondary | ICD-10-CM | POA: Diagnosis not present

## 2019-09-03 DIAGNOSIS — I2510 Atherosclerotic heart disease of native coronary artery without angina pectoris: Secondary | ICD-10-CM | POA: Diagnosis not present

## 2019-09-03 DIAGNOSIS — F17210 Nicotine dependence, cigarettes, uncomplicated: Secondary | ICD-10-CM | POA: Insufficient documentation

## 2019-09-03 DIAGNOSIS — Z7901 Long term (current) use of anticoagulants: Secondary | ICD-10-CM | POA: Diagnosis not present

## 2019-09-03 DIAGNOSIS — Z79899 Other long term (current) drug therapy: Secondary | ICD-10-CM | POA: Diagnosis not present

## 2019-09-03 DIAGNOSIS — Z7982 Long term (current) use of aspirin: Secondary | ICD-10-CM | POA: Insufficient documentation

## 2019-09-03 DIAGNOSIS — I5042 Chronic combined systolic (congestive) and diastolic (congestive) heart failure: Secondary | ICD-10-CM

## 2019-09-03 DIAGNOSIS — I5022 Chronic systolic (congestive) heart failure: Secondary | ICD-10-CM | POA: Insufficient documentation

## 2019-09-03 DIAGNOSIS — I25708 Atherosclerosis of coronary artery bypass graft(s), unspecified, with other forms of angina pectoris: Secondary | ICD-10-CM

## 2019-09-03 DIAGNOSIS — I255 Ischemic cardiomyopathy: Secondary | ICD-10-CM | POA: Diagnosis not present

## 2019-09-03 LAB — BASIC METABOLIC PANEL
Anion gap: 10 (ref 5–15)
BUN: 16 mg/dL (ref 6–20)
CO2: 24 mmol/L (ref 22–32)
Calcium: 9.6 mg/dL (ref 8.9–10.3)
Chloride: 102 mmol/L (ref 98–111)
Creatinine, Ser: 0.82 mg/dL (ref 0.44–1.00)
GFR calc Af Amer: >60 mL/min (ref >60)
GFR calc non Af Amer: >60 mL/min (ref >60)
Glucose, Bld: 106 mg/dL — ABNORMAL HIGH (ref 70–99)
Potassium: 4.2 mmol/L (ref 3.5–5.1)
Sodium: 136 mmol/L (ref 135–145)

## 2019-09-03 LAB — LIPID PANEL
Cholesterol: 172 mg/dL (ref 0–200)
HDL: 48 mg/dL (ref >40)
LDL Cholesterol: 67 mg/dL (ref 0–99)
Total CHOL/HDL Ratio: 3.6 RATIO
Triglycerides: 286 mg/dL — ABNORMAL HIGH (ref <150)
VLDL: 57 mg/dL — ABNORMAL HIGH (ref 0–40)

## 2019-09-03 MED ORDER — SPIRONOLACTONE 25 MG PO TABS: 25.0000 mg | ORAL_TABLET | Freq: Every day | ORAL | 3 refills | Status: DC

## 2019-09-03 MED ORDER — ENTRESTO 49-51 MG PO TABS: 1.0000 | ORAL_TABLET | Freq: Two times a day (BID) | ORAL | 3 refills | Status: DC

## 2019-09-03 NOTE — Patient Instructions (Signed)
INCREASE Entresto to 49/51mg  twice daily  INCREASE Spironolactone to 25mg  daily  STOP Potassium  STOP Lasix  Routine lab work today. Will notify you of abnormal results  Repeat labs in 10 days  Follow up with PharmD in 3 weeks  Follow up with Dr.McLean in 2 months  Do the following things EVERYDAY: 1) Weigh yourself in the morning before breakfast. Write it down and keep it in a log. 2) Take your medicines as prescribed 3) Eat low salt foods--Limit salt (sodium) to 2000 mg per day.  4) Stay as active as you can everyday 5) Limit all fluids for the day to less than 2 liters

## 2019-09-04 NOTE — Progress Notes (Signed)
PCP: [name], MD Cardiology: [name] HF Cardiology: [name]  59 y.o. with history of CAD, ischemic cardiomyopathy, and PAD was referred by [name] for evaluation of CHF.  She had NSTEMI in 11/17 with PCI to prox/mid LCx and RCA.  Subsequently, has developed ischemic cardiomyopathy.  Most recent echo in 1/21 showed EF 30-35%.  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   RHC/LHC done with exertional chest heaviness and dyspnea in 4/21 showed normal filling pressures, preserved cardiac output, and nonobstructive mild CAD.  ABIs were normal in 4/21.   She returns for followup of CHF.  She still gets chest tightness at work but thinks that it may be due to stress.  Short of breath walking up stairs, does ok on flat ground.  No orthopnea/PND.  No lightheadedness. She has cut back to about 1-2 cigarettes/week.   Labs (2/21): K 3.7, creatinine 0.82 Labs (4/21): K 4.3, creatinine 0.81  PMH: 1. CAD: NSTEMI with DES to proximal and mid LCx in 11/17, staged DES x 2 to RCA later in 11/17.  - Cardiolite (10/20): EF < 30%, large inferior and inferolateral MI with mild peri-infarct ischemia.  - LHC (4/21): Patent stents, nonobstructive CAD.  2. Chronic systolic CHF: Ischemic cardiomyopathy.  - Echo (10/20): EF 35-40%, lateral WMAs, normal RV. - Echo (1/21): EF 30-35%, mild LVH, normal RV - RHC (4/21): mean RA 5, PA 29/3, mean PCWP 12, CI 3.04 3. 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  4. Carotid stenosis: Carotid dopplers ([id number]) with [id number] LICA stenosis.  5. Active smoker.  6. ABIs (4/21): Normal.   Social History   Socioeconomic History  . Marital status: Married    Spouse name: Not on file  . Number of children: Not on file  . Years of education: Not on file  . Highest education level: Not on file  Occupational History  . Occupation: CNA  Tobacco Use  . Smoking status: Current Some Day Smoker     Packs/day: 0.10    Years: 15.00    Pack years: 1.50    Types: Cigarettes  . Smokeless tobacco: Never Used  . Tobacco comment: smokes a cigarette occasionally  Substance and Sexual Activity  . Alcohol use: Not Currently    Comment: occasionally  . Drug use: Not Currently    Types: Marijuana    Comment: former- 2017 last time  . Sexual activity: Not Currently    Birth control/protection: Surgical    Comment: hyst  Other Topics Concern  . Not on file  Social History Narrative   Lives with husband in [place] in a one story home with a basement.  Has 4 children.  Works as a Quarry manager.  Education: CNA school.    Social Determinants of Health   Financial Resource Strain:   . Difficulty of Paying Living Expenses:   Food Insecurity:   . Worried About Charity fundraiser in the Last Year:   . Arboriculturist in the Last Year:   Transportation Needs:   . Film/video editor (Medical):   [name] Kitchen Lack of Transportation (Non-Medical):   Physical Activity:   . Days of Exercise per Week:   . Minutes of Exercise per Session:   Stress:   . Feeling of Stress :   Social Connections:   . Frequency of Communication with Friends and Family:   . Frequency of Social Gatherings with Friends and Family:   . Attends Religious  Services:   . Active Member of Clubs or Organizations:   . Attends Archivist Meetings:   [name] Kitchen Marital Status:   Intimate Partner Violence:   . Fear of Current or Ex-Partner:   . Emotionally Abused:   [name] Kitchen Physically Abused:   . Sexually Abused:    Family History  Problem Relation Age of Onset  . Diabetes Father   . Hypertension Father   . CAD Father   . Colon polyps Father 60       pre-cancerous   . Stroke Father   . Dementia Father   . Stroke Mother   . Hypertension Mother   . Diabetes Mother   . Heart failure Other   . Breast cancer Maternal Grandmother   . Colon cancer Neg Hx    ROS: All systems reviewed and negative except as per HPI.   Current Outpatient  Medications  Medication Sig Dispense Refill  . acetaminophen (TYLENOL) 325 MG tablet Take 325 mg by mouth every 6 (six) hours as needed for mild pain or headache.     . ALPRAZolam (XANAX) 1 MG tablet Take 1-2 mg by mouth at bedtime as needed for anxiety.     [name] Kitchen aspirin EC 81 MG tablet Take 81 mg by mouth every evening.     [name] Kitchen atorvastatin (LIPITOR) 80 MG tablet TAKE 1 TABLET BY MOUTH ONCE DAILY AT  6  IN  THE  EVENING 90 tablet 2  . carvedilol (COREG) 3.125 MG tablet Take 1 tablet (3.125 mg total) by mouth 2 (two) times daily with a meal. 60 tablet 4  . clopidogrel (PLAVIX) 75 MG tablet Take 1 tablet (75 mg total) by mouth daily. 90 tablet 2  . ezetimibe (ZETIA) 10 MG tablet Take 1 tablet by mouth once daily 30 tablet 6  . gabapentin (NEURONTIN) 100 MG capsule Take 100 mg by mouth 2 (two) times daily.     [name] Kitchen loperamide (IMODIUM) 2 MG capsule Take 2 mg by mouth 4 (four) times daily as needed for diarrhea or loose stools (ibs).    . LORazepam (ATIVAN) 1 MG tablet Take 1 mg by mouth daily as needed for anxiety.   1  . Multiple Vitamins-Minerals (MULTIVITAMIN WITH MINERALS) tablet Take 1 tablet by mouth daily.    . nicotine (NICODERM CQ - DOSED IN MG/24 HOURS) 21 mg/24hr patch Place 21 mg onto the skin daily.    [name] Kitchen rOPINIRole (REQUIP) 0.5 MG tablet Take 0.5 mg by mouth at bedtime.   0  . spironolactone (ALDACTONE) 25 MG tablet Take 1 tablet (25 mg total) by mouth daily. 30 tablet 3  . zinc gluconate 50 MG tablet Take 50 mg by mouth daily.    . nitroGLYCERIN (NITROSTAT) 0.4 MG SL tablet Place 1 tablet (0.4 mg total) under the tongue every 5 (five) minutes x 3 doses as needed for chest pain. (Patient not taking: Reported on 09/03/2019) 30 tablet 12  . sacubitril-valsartan (ENTRESTO) 49-51 MG Take 1 tablet by mouth 2 (two) times daily. 60 tablet 3   No current facility-administered medications for this encounter.   BP (!) 122/104   Pulse 84   Wt 60.5 kg (133 lb 6.4 oz)   SpO2 96%   BMI 26.05 kg/m   General: NAD Neck: No JVD, no thyromegaly or thyroid nodule.  Lungs: Clear to auscultation bilaterally with normal respiratory effort. CV: Nondisplaced PMI.  Heart regular S1/S2, no S3/S4, no murmur.  No peripheral edema.  No carotid bruit.  Difficult to  palpate pedal pulses.  Abdomen: Soft, nontender, no hepatosplenomegaly, no distention.  Skin: Intact without lesions or rashes.  Neurologic: Alert and oriented x 3.  Psych: Normal affect. Extremities: No clubbing or cyanosis.  HEENT: Normal.   Assessment/Plan: 1. CAD: S/p NSTEMI in 11/17 with DEX to LCx and DES x 2 to RCA.  Cardiolite in 10/20 with EF <30%, inferior/inferolateral infarct with peri-infarct ischemia. LHC in 4/21 showed nonobstructive mild CAD.  Suspect chest pain at work may be due to stress. - Continue atorvastatin and Zetia.  Check lipids today.  - Continue ASA 81 and Plavix 75 mg daily for now.  She will likely be a good candidate to replace Plavix with rivaroxaban 2.5 mg bid in the future. She will continue Plavix as long as Dr. Estanislado Pandy feels it is needed post her cranial circulation intervention.  2. Chronic systolic CHF: Ischemic cardiomyopathy. Last echo in 1/21 with EF 30-35%.  RHC in 4/21 with normal filling pressures and preserved cardiac output.  She is not volume overloaded on exam.  - Increase Entresto to 49/51 bid with BMET today and in 10 days.  - Continue Coreg 3.125 mg bid.   - Increase spironolactone to 25 mg daily.   - Stop Lasix and KCl.   - Narrow QRS so not CRT candidate.  - Repeat echo in 3-4 months after medication titration and cath, she may need an ICD.  3. Carotid stenosis: Repeat carotid dopplers in 10/21.   4. Smoking: Strongly encouraged her to quit.  She is cutting back a lot and is using nicotine patches.  5. 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   See HF pharmacist in 2-3 wks for med titration, see me in 2 months.   Loralie Champagne 09/04/2019

## 2019-09-06 ENCOUNTER — Encounter (HOSPITAL_COMMUNITY): Payer: Self-pay

## 2019-09-06 NOTE — Progress Notes (Signed)
Letter mailed to patients address on file to contact us about her most recent lab results.

## 2019-09-12 ENCOUNTER — Other Ambulatory Visit (HOSPITAL_COMMUNITY): Payer: Self-pay

## 2019-09-12 ENCOUNTER — Other Ambulatory Visit: Payer: Self-pay

## 2019-09-12 ENCOUNTER — Telehealth (HOSPITAL_COMMUNITY): Payer: Self-pay | Admitting: *Deleted

## 2019-09-12 ENCOUNTER — Ambulatory Visit (HOSPITAL_COMMUNITY)
Admission: RE | Admit: 2019-09-12 | Discharge: 2019-09-12 | Disposition: A | Discharge status: Home / Self Care | Payer: PRIVATE HEALTH INSURANCE | Source: Ambulatory Visit | Attending: Internal Medicine | Admitting: Internal Medicine

## 2019-09-12 ENCOUNTER — Telehealth (HOSPITAL_COMMUNITY): Payer: Self-pay | Admitting: Pharmacist

## 2019-09-12 ENCOUNTER — Telehealth (HOSPITAL_COMMUNITY): Payer: Self-pay

## 2019-09-12 DIAGNOSIS — I5042 Chronic combined systolic (congestive) and diastolic (congestive) heart failure: Secondary | ICD-10-CM | POA: Insufficient documentation

## 2019-09-12 DIAGNOSIS — E785 Hyperlipidemia, unspecified: Secondary | ICD-10-CM

## 2019-09-12 LAB — BASIC METABOLIC PANEL
Anion gap: 8 (ref 5–15)
BUN: 14 mg/dL (ref 6–20)
CO2: 25 mmol/L (ref 22–32)
Calcium: 9.1 mg/dL (ref 8.9–10.3)
Chloride: 105 mmol/L (ref 98–111)
Creatinine, Ser: 0.88 mg/dL (ref 0.44–1.00)
GFR calc Af Amer: >60 mL/min (ref >60)
GFR calc non Af Amer: >60 mL/min (ref >60)
Glucose, Bld: 102 mg/dL — ABNORMAL HIGH (ref 70–99)
Potassium: 4.2 mmol/L (ref 3.5–5.1)
Sodium: 138 mmol/L (ref 135–145)

## 2019-09-12 MED ORDER — ICOSAPENT ETHYL 1 G PO CAPS
2.0000 g | ORAL_CAPSULE | Freq: Two times a day (BID) | ORAL | 4 refills | Status: DC
Start: 2019-09-12 — End: 2019-12-18

## 2019-09-12 NOTE — Telephone Encounter (Signed)
Spoke with patient today while at lab appt. Advised of lab results and need to start new medication [name] 2g bid. Pt verbalized understanding. Pt will rtc in 2 months to repeat lipids

## 2019-09-12 NOTE — Telephone Encounter (Signed)
Advanced Heart Failure Patient Advocate Encounter   Patient was approved to receive Entresto from [employer].  Patient ID: [id number] Effective dates: 09/12/19 through 09/11/20  [name], PharmD, BCPS, BCCP, CPP Heart Failure Clinic Pharmacist [phone]

## 2019-09-12 NOTE — Telephone Encounter (Signed)
-----   Message from Larey Dresser, MD sent at 09/03/2019 11:38 PM EDT ----- Would start [name] 2 g bid for elevated triglycerides, will lower risk of cardiac death and MI. Lipids 2 months.

## 2019-09-12 NOTE — Telephone Encounter (Signed)
Patient assistance application for entresto faxed to [employer]

## 2019-09-26 ENCOUNTER — Other Ambulatory Visit: Payer: Self-pay

## 2019-09-26 ENCOUNTER — Ambulatory Visit (HOSPITAL_COMMUNITY)
Admission: RE | Admit: 2019-09-26 | Discharge: 2019-09-26 | Disposition: A | Discharge status: Home / Self Care | Payer: PRIVATE HEALTH INSURANCE | Source: Ambulatory Visit | Attending: Internal Medicine | Admitting: Internal Medicine

## 2019-09-26 VITALS — BP 108/66 | HR 82 | Ht 60.0 in | Wt 132.4 lb

## 2019-09-26 DIAGNOSIS — Z955 Presence of coronary angioplasty implant and graft: Secondary | ICD-10-CM | POA: Insufficient documentation

## 2019-09-26 DIAGNOSIS — I728 Aneurysm of other specified arteries: Secondary | ICD-10-CM | POA: Diagnosis not present

## 2019-09-26 DIAGNOSIS — I739 Peripheral vascular disease, unspecified: Secondary | ICD-10-CM | POA: Diagnosis not present

## 2019-09-26 DIAGNOSIS — I252 Old myocardial infarction: Secondary | ICD-10-CM | POA: Insufficient documentation

## 2019-09-26 DIAGNOSIS — I5022 Chronic systolic (congestive) heart failure: Secondary | ICD-10-CM | POA: Insufficient documentation

## 2019-09-26 DIAGNOSIS — F17200 Nicotine dependence, unspecified, uncomplicated: Secondary | ICD-10-CM | POA: Diagnosis not present

## 2019-09-26 DIAGNOSIS — I2510 Atherosclerotic heart disease of native coronary artery without angina pectoris: Secondary | ICD-10-CM | POA: Diagnosis not present

## 2019-09-26 DIAGNOSIS — I5042 Chronic combined systolic (congestive) and diastolic (congestive) heart failure: Secondary | ICD-10-CM

## 2019-09-26 DIAGNOSIS — I255 Ischemic cardiomyopathy: Secondary | ICD-10-CM | POA: Insufficient documentation

## 2019-09-26 DIAGNOSIS — Z79899 Other long term (current) drug therapy: Secondary | ICD-10-CM | POA: Insufficient documentation

## 2019-09-26 DIAGNOSIS — I6529 Occlusion and stenosis of unspecified carotid artery: Secondary | ICD-10-CM | POA: Diagnosis not present

## 2019-09-26 DIAGNOSIS — Z7982 Long term (current) use of aspirin: Secondary | ICD-10-CM | POA: Insufficient documentation

## 2019-09-26 MED ORDER — FARXIGA 10 MG PO TABS
10.0000 mg | ORAL_TABLET | Freq: Every day | ORAL | 11 refills | Status: DC
Start: 2019-09-26 — End: 2020-09-30

## 2019-09-26 NOTE — Progress Notes (Signed)
PCP: Erin Gravel, MD Cardiology: Dr. Jacinta [name] HF Cardiology: Dr. Aundra [name]  HPI: 59 y.o. with history of CAD, ischemic cardiomyopathy, and PAD was referred by Dr. Jacinta [name] for evaluation of CHF. She had an NSTEMI in 11/17 with PCI to prox/mid LCx and RCA. Subsequently, has developed ischemic cardiomyopathy. Most recent echo in 1/21 showed EF 30-35%. 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   RHC/LHC done with exertional chest heaviness and dyspnea in 4/21 showed normal filling pressures, preserved cardiac output, and nonobstructive mild CAD. ABIs were normal in 4/21.    At the last HF clinic visit with Dr. Aundra [name] on 09/03/19, she was still having chest tightness at work but thought it may be due to stress. She endorsed that she got SOB walking up stairs, but did ok on flat ground. No orthopnea/PND. No lightheadedness. She had cut back to about 1-2 cigarettes/week.   Today she returns to HF clinic for pharmacist medication titration. At last visit with MD, Erin [name] was increased to 49/51 mg BID and spironolactone was increased to 25 mg daily. Additionally, furosemide and potassium supplementation were discontinued. Symptomatically, she is doing well. She endorses some mild dizziness and lightheadedness but she is not sure if this is related to her heart failure. She stated that she "feels like I'm walking through a tunnel sometimes." She denies chest pain but has some palpitations sometimes due to stress from her work. She only gets SOB when walking up stairs but can walk on flat ground without issues. She is able to complete all ADLs and is moderately active. 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 She checks her weight at home (normal range 132-134 lbs). No LEE, PND, or orthopnea. Her appetite has decreased lately. She is trying to eat better since she was told that her cholesterol was high. She adheres to a low-salt diet.    HF Medications: Carvedilol 3.125 mg BID Entresto 49/51 mg BID Spironolactone 25 mg daily  Has the patient been experiencing any side effects to the medications prescribed?  no  Does the patient have any problems obtaining medications due to transportation or finances?   No, she has new Pharmacist, community and has been given co-pay cards for Erin [name], and Erin [name] which are affordable for her at this time. [employer] patient assistance for Erin [name] was submitted in error.   Understanding of regimen: good Understanding of indications: good Potential of compliance: good Patient understands to avoid NSAIDs. Patient understands to avoid decongestants.   Pertinent Lab Values (09/12/19): Erin [name] Kitchen Serum creatinine 0.88, BUN 14, Potassium 4.2, Sodium 138, BNP 80 pg/mL (06/25/19)  Vital Signs: . Weight: 132.4 lbs (last clinic weight: 133 lbs) . Blood pressure: 108/66  . Heart rate: 82   Assessment: 1. CAD: S/p NSTEMI in 11/17 with DEX to LCx and DES x 2 to RCA. Cardiolite in 10/20 with EF <30%, inferior/inferolateral infarct with peri-infarct ischemia. LHC in 4/21 showed nonobstructive mild CAD. Suspect chest pain at work may be due to stress. - Continue atorvastatin and ezetimibe. LDL controlled (67 mg/dL 09/03/19) - Continue ASA 81 mg and Plavix 75 mg daily for now. She will likely be a good candidate to replace Plavix with rivaroxaban 2.5 mg bid in the future. She will continue Plavix as long as Dr. Estanislado Pandy feels it is needed post her cranial circulation intervention.  -Continue Erin [name] 2g BID  2. Chronic systolic CHF: Ischemic cardiomyopathy. Last echo in 1/21  with EF 30-35%. RHC in 4/21 with normal filling pressures and preserved cardiac output.  - NYHA class II - She is not volume overloaded on exam - BP 108/66, HR 82 - She is no longer taking any diuretic.  - Continue carvedilol 3.125 mg BID - Continue Entresto 49/51 mg BID   - Continue spironolactone 25 mg daily - Initiate  dapagliflozin 10 mg daily. Repeat BMET in 4 weeks.  - Narrow QRS so not CRT candidate - Repeat echo in 3-4 months after medication titration and cath, she may need an ICD.   3. Carotid stenosis: Repeat carotid dopplers in 10/21.    4. Smoking: Strongly encouraged her to quit. She is cutting back a lot and is using nicotine patches.   5. 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   Plan: 1) Medication changes: Based on clinical presentation, vital signs and recent labs will initiate dapagliflozin 10 mg daily 2) Follow-up: 4 weeks with pharmacist & 6 weeks with Dr. [name], PharmD PGY1 Ambulatory Care Pharmacy Resident  [name], PharmD, BCPS, [facility], CPP Heart Failure Clinic Pharmacist [phone]

## 2019-09-26 NOTE — Patient Instructions (Addendum)
It was a pleasure seeing you today!  MEDICATIONS: -We are changing your medications today -Start Farxiga (dapagliflozin) 10mg  (1 tablet) by mouth daily -Please get your Entresto refills from [facility]. They have a prescriptions for your Entresto. -Use the co-pay cards we gave you today for Entresto and Farxiga -Call if you have questions about your medications.  NEXT APPOINTMENT: Return to clinic in 4 weeks with clinic pharmacist.  In general, to take care of your heart failure: -Limit your fluid intake to 2 Liters (half-gallon) per day.   -Limit your salt intake to ideally 2-3 grams (2000-3000 mg) per day. -Weigh yourself daily and record, and bring that "weight diary" to your next appointment.  (Weight gain of 2-3 pounds in 1 day typically means fluid weight.) -The medications for your heart are to help your heart and help you live longer.   -Please contact us before stopping any of your heart medications.  Call the clinic at [phone] with questions or to reschedule future appointments.

## 2019-10-04 ENCOUNTER — Other Ambulatory Visit: Payer: Self-pay | Admitting: Cardiovascular Disease

## 2019-10-11 ENCOUNTER — Other Ambulatory Visit (HOSPITAL_COMMUNITY): Payer: Self-pay | Admitting: Cardiology

## 2019-10-15 ENCOUNTER — Other Ambulatory Visit (HOSPITAL_COMMUNITY): Payer: Self-pay | Admitting: Interventional Radiology

## 2019-10-15 DIAGNOSIS — I771 Stricture of artery: Secondary | ICD-10-CM

## 2019-10-21 ENCOUNTER — Other Ambulatory Visit: Payer: Self-pay

## 2019-10-21 ENCOUNTER — Ambulatory Visit (HOSPITAL_COMMUNITY)
Admission: RE | Admit: 2019-10-21 | Discharge: 2019-10-21 | Disposition: A | Discharge status: Home / Self Care | Payer: PRIVATE HEALTH INSURANCE | Source: Ambulatory Visit | Attending: Interventional Radiology | Admitting: Interventional Radiology

## 2019-10-21 DIAGNOSIS — I771 Stricture of artery: Secondary | ICD-10-CM | POA: Insufficient documentation

## 2019-10-21 NOTE — Progress Notes (Signed)
VASCULAR LAB PRELIMINARY  PRELIMINARY  PRELIMINARY  PRELIMINARY  Carotid duplex completed.    Preliminary report:  See CV proc for preliminary results.  Ilo Beamon, RVT 10/21/2019, 1:18 PM

## 2019-10-22 ENCOUNTER — Telehealth (HOSPITAL_COMMUNITY): Payer: Self-pay

## 2019-10-22 ENCOUNTER — Emergency Department (HOSPITAL_COMMUNITY)
Admission: EM | Admit: 2019-10-22 | Discharge: 2019-10-22 | Disposition: A | Discharge status: Home / Self Care | Payer: PRIVATE HEALTH INSURANCE | Attending: Emergency Medicine | Admitting: Emergency Medicine

## 2019-10-22 ENCOUNTER — Encounter (HOSPITAL_COMMUNITY): Payer: Self-pay | Admitting: Emergency Medicine

## 2019-10-22 ENCOUNTER — Other Ambulatory Visit: Payer: Self-pay

## 2019-10-22 ENCOUNTER — Emergency Department (HOSPITAL_COMMUNITY): Payer: PRIVATE HEALTH INSURANCE

## 2019-10-22 DIAGNOSIS — R079 Chest pain, unspecified: Secondary | ICD-10-CM | POA: Diagnosis present

## 2019-10-22 DIAGNOSIS — I110 Hypertensive heart disease with heart failure: Secondary | ICD-10-CM | POA: Insufficient documentation

## 2019-10-22 DIAGNOSIS — F17210 Nicotine dependence, cigarettes, uncomplicated: Secondary | ICD-10-CM | POA: Insufficient documentation

## 2019-10-22 DIAGNOSIS — I2510 Atherosclerotic heart disease of native coronary artery without angina pectoris: Secondary | ICD-10-CM | POA: Diagnosis not present

## 2019-10-22 DIAGNOSIS — R002 Palpitations: Secondary | ICD-10-CM | POA: Insufficient documentation

## 2019-10-22 DIAGNOSIS — I5042 Chronic combined systolic (congestive) and diastolic (congestive) heart failure: Secondary | ICD-10-CM | POA: Diagnosis not present

## 2019-10-22 DIAGNOSIS — Z7982 Long term (current) use of aspirin: Secondary | ICD-10-CM | POA: Diagnosis not present

## 2019-10-22 DIAGNOSIS — R110 Nausea: Secondary | ICD-10-CM | POA: Diagnosis not present

## 2019-10-22 DIAGNOSIS — Z9861 Coronary angioplasty status: Secondary | ICD-10-CM | POA: Insufficient documentation

## 2019-10-22 DIAGNOSIS — Z79899 Other long term (current) drug therapy: Secondary | ICD-10-CM | POA: Insufficient documentation

## 2019-10-22 DIAGNOSIS — R0789 Other chest pain: Secondary | ICD-10-CM

## 2019-10-22 LAB — CBC
HCT: 35.1 % — ABNORMAL LOW (ref 36.0–46.0)
Hemoglobin: 11.6 g/dL — ABNORMAL LOW (ref 12.0–15.0)
MCH: 29 pg (ref 26.0–34.0)
MCHC: 33 g/dL (ref 30.0–36.0)
MCV: 87.8 fL (ref 80.0–100.0)
Platelets: 439 10*3/uL — ABNORMAL HIGH (ref 150–400)
RBC: 4 MIL/uL (ref 3.87–5.11)
RDW: 16.5 % — ABNORMAL HIGH (ref 11.5–15.5)
WBC: 9.8 10*3/uL (ref 4.0–10.5)
nRBC: 0 % (ref 0.0–0.2)

## 2019-10-22 LAB — BASIC METABOLIC PANEL
Anion gap: 8 (ref 5–15)
BUN: 33 mg/dL — ABNORMAL HIGH (ref 6–20)
CO2: 23 mmol/L (ref 22–32)
Calcium: 9.7 mg/dL (ref 8.9–10.3)
Chloride: 105 mmol/L (ref 98–111)
Creatinine, Ser: 1.22 mg/dL — ABNORMAL HIGH (ref 0.44–1.00)
GFR calc Af Amer: 57 mL/min — ABNORMAL LOW (ref >60)
GFR calc non Af Amer: 49 mL/min — ABNORMAL LOW (ref >60)
Glucose, Bld: 106 mg/dL — ABNORMAL HIGH (ref 70–99)
Potassium: 4.7 mmol/L (ref 3.5–5.1)
Sodium: 136 mmol/L (ref 135–145)

## 2019-10-22 LAB — TROPONIN I (HIGH SENSITIVITY)
Troponin I (High Sensitivity): 5 ng/L (ref <18)
Troponin I (High Sensitivity): 5 ng/L (ref <18)

## 2019-10-22 NOTE — Discharge Instructions (Signed)
Follow-up with your CHF doctor, Dr. Algernon Huxley, in 2 days as scheduled.  I would also like you to follow-up with your other cardiologist, Dr. Bronson Ing.  Your laboratory work-up and physical exam today is reassuring.  Do not suspect any acute or emergent pathology.  While we are unsure as to what caused your vibratory sensation in the chest, we can rule out many of the scary causes for chest discomfort.   Return to the ED or seek immediate medical attention should you experience any new or worsening symptoms.

## 2019-10-22 NOTE — ED Triage Notes (Signed)
Patient c/o chest "vibration" that began this morning at 1000.

## 2019-10-22 NOTE — ED Provider Notes (Addendum)
[facility] EMERGENCY DEPARTMENT Provider Note   CSN: [id number] Arrival date & time: 10/22/19  1205     History Chief Complaint  Patient presents with  . Chest Pain    Erin [name] is a 59 y.o. female with PMH of CHF, CAD, HTN, HLD, GERD, and anxiety who presents the ED with complaints of "vibration" chest pain that began at 10 AM this morning.  I reviewed patient's recent cardiac history.  Most recent echo performed 05/29/2019 demonstrated EF 30 to 35%.  Left heart cardiac catheterization performed 08/27/2019 demonstrated patent stents and nonobstructive CAD.  Patient states that it felt as though her cell phone was vibrating in her chest and it was accompanied by some mild nausea, shortness of breath, and palpitations.  The symptoms have since predominantly abated, however given her cardiac history, she wanted to be evaluated here in the ED.  She states that she has never had the symptoms occur before.  She was at rest when they started.  She denies any dyspnea on exertion, recent illness, fevers or chills, cough, orthopnea, leg swelling, weight change, increased urinary frequency, or other symptoms.  HPI     Past Medical History:  Diagnosis Date  . Anxiety state 03/25/2016  . Basal cell carcinoma of forehead   . CHF (congestive heart failure) ([place])   . Coronary artery disease    a. 03/11/16 PCI with DES-->Prox/Mid Cx;  b. 03/14/16 PCI with DES x2-->RCA, EF 30-35%.  . Essential hypertension   . GERD (gastroesophageal reflux disease)   . HFrEF (heart failure with reduced ejection fraction) ([place])    a. 10/2016 Echo: EF 35-40%, Gr1 DD, mild focal basal septal hypertrophy, basal inflat, mid inflat, basal antlat AK. Mid infept/inf/antlat, apical lateral sev HK. Mod MR. mildly reduced RV fxn. Mild TR.  [name] Kitchen History of pneumonia   . Hyperlipidemia   . IBS (irritable bowel syndrome)    diarrhea  . Ischemic cardiomyopathy    a. 10/2016 Echo: EF 35-40%, Gr1 DD.  [name] Kitchen Mitral regurgitation   .  NSTEMI (non-ST elevated myocardial infarction) ([place]) 03/10/2016  . Pneumonia 03/2016  . Squamous cell cancer of skin of nose   . Thrombocytosis ([place]) 03/26/2016  . Tobacco abuse   . Trichimoniasis   . Wears dentures   . Wears glasses     Patient Active Problem List   Diagnosis Date Noted  . Brain aneurysm 04/15/2019  . Dysphagia 02/27/2018  . Encounter for screening colonoscopy 02/27/2018  . History of Clostridium difficile infection 02/27/2018  . Frequent stools 12/20/2017  . Chronic combined systolic and diastolic CHF (congestive heart failure) ([place]) 06/20/2016  . Hypokalemia   . Acute CHF (congestive heart failure) ([place]) 05/16/2016  . Acute on chronic systolic CHF (congestive heart failure) ([place]) 05/16/2016  . Acute respiratory failure with hypoxia ([place])   . Thrombocytosis ([place]) 03/26/2016  . Cardiomyopathy, ischemic 03/25/2016  . Chronic combined systolic and diastolic heart failure ([place]) 03/25/2016  . Anxiety state 03/25/2016  . Troponin level elevated 03/25/2016  . CAD (coronary artery disease), native coronary artery 03/25/2016  . Normocytic anemia 03/25/2016  . SOB (shortness of breath) 03/24/2016  . Lightheadedness 03/17/2016  . Hypotension 03/17/2016  . Tobacco abuse 03/12/2016  . NSTEMI (non-ST elevated myocardial infarction) ([place]) 03/11/2016  . Pain in the chest   . Essential hypertension 09/06/2015  . Mixed hyperlipidemia 09/06/2015  . GERD (gastroesophageal reflux disease) 09/06/2015  . Chest pain 09/06/2015    Past Surgical History:  Procedure Laterality Date  .  APPENDECTOMY    . BIOPSY  09/20/2018   Procedure: BIOPSY;  Surgeon: Daneil Dolin, MD;  [facility];  Service: Endoscopy;;  colon  . CARDIAC CATHETERIZATION N/A 03/11/2016   Procedure: Left Heart Cath and Coronary Angiography;  Surgeon: Leonie Man, MD;  Location: [place] [facility];  Service: Cardiovascular;  Laterality: N/A;  . CARDIAC CATHETERIZATION N/A 03/11/2016   Procedure:  Coronary Stent Intervention;  Surgeon: Leonie Man, MD;  Location: [place] [facility] LAB;  Service: Cardiovascular;  Laterality: N/A;  . CARDIAC CATHETERIZATION N/A 03/14/2016   Procedure: Coronary Stent Intervention;  Surgeon: Peter M Martinique, MD;  Location: [place] [facility];  Service: Cardiovascular;  Laterality: N/A;  . CHOLECYSTECTOMY OPEN  1984  . COLONOSCOPY WITH PROPOFOL N/A 09/20/2018   Procedure: COLONOSCOPY WITH PROPOFOL;  Surgeon: Daneil Dolin, MD;  [facility];  Service: Endoscopy;  Laterality: N/A;  10:30am  . CORONARY ANGIOPLASTY WITH STENT PLACEMENT  03/14/2016  . ESOPHAGOGASTRODUODENOSCOPY (EGD) WITH PROPOFOL N/A 09/20/2018   Procedure: ESOPHAGOGASTRODUODENOSCOPY (EGD) WITH PROPOFOL;  Surgeon: Daneil Dolin, MD;  [facility];  Service: Endoscopy;  Laterality: N/A;  . FINGER ARTHROPLASTY Left 05/14/2013   Procedure: LEFT THUMB CARPAL METACARPAL ARTHROPLASTY;  Surgeon: Tennis Must, MD;  Location: [place];  Service: Orthopedics;  Laterality: Left;  . IR ANGIO INTRA EXTRACRAN SEL COM CAROTID INNOMINATE BILAT MOD SED  01/05/2017  . IR ANGIO INTRA EXTRACRAN SEL COM CAROTID INNOMINATE BILAT MOD SED  03/19/2019  . IR ANGIO INTRA EXTRACRAN SEL INTERNAL CAROTID UNI L MOD SED  04/15/2019  . IR ANGIO VERTEBRAL SEL VERTEBRAL BILAT MOD SED  01/05/2017  . IR ANGIO VERTEBRAL SEL VERTEBRAL BILAT MOD SED  03/19/2019  . IR ANGIOGRAM FOLLOW UP STUDY  04/15/2019  . IR RADIOLOGIST EVAL & MGMT  12/30/2016  . IR TRANSCATH/EMBOLIZ  04/15/2019  . IR US GUIDE VASC ACCESS RIGHT  03/19/2019  . MALONEY DILATION N/A 09/20/2018   Procedure: Venia Minks DILATION;  Surgeon: Daneil Dolin, MD;  [facility];  Service: Endoscopy;  Laterality: N/A;  . RADIOLOGY WITH ANESTHESIA N/A 04/15/2019   Procedure: [employer];  Surgeon: Luanne Bras, MD;  Location: [place];  Service: Radiology;  Laterality: N/A;  . RIGHT/LEFT HEART CATH AND CORONARY ANGIOGRAPHY N/A  08/19/2019   Procedure: RIGHT/LEFT HEART CATH AND CORONARY ANGIOGRAPHY;  Surgeon: Larey Dresser, MD;  Location: [place] [facility];  Service: Cardiovascular;  Laterality: N/A;  . TUBAL LIGATION  1987  . VAGINAL HYSTERECTOMY  2009     OB History    Gravida  4   Para  4   Term  4   Preterm      AB      Living  4     SAB      TAB      Ectopic      Multiple      Live Births              Family History  Problem Relation Age of Onset  . Diabetes Father   . Hypertension Father   . CAD Father   . Colon polyps Father 60       pre-cancerous   . Stroke Father   . Dementia Father   . Stroke Mother   . Hypertension Mother   . Diabetes Mother   . Heart failure Other   . Breast cancer Maternal Grandmother   . Colon  cancer Neg Hx     Social History   Tobacco Use  . Smoking status: Current Some Day Smoker    Packs/day: 0.10    Years: 15.00    Pack years: 1.50    Types: Cigarettes  . Smokeless tobacco: Never Used  . Tobacco comment: smokes a cigarette occasionally  Vaping Use  . Vaping Use: Never used  Substance Use Topics  . Alcohol use: Not Currently    Comment: occasionally  . Drug use: Not Currently    Types: Marijuana    Comment: former- 2017 last time    Home Medications Prior to Admission medications   Medication Sig Start Date End Date Taking? Authorizing Provider  acetaminophen (TYLENOL) 325 MG tablet Take 325 mg by mouth every 6 (six) hours as needed for mild pain or headache.     [provider]  ALPRAZolam Duanne Moron) 1 MG tablet Take 1-2 mg by mouth at bedtime as needed for anxiety.     [provider]  aspirin EC 81 MG tablet Take 81 mg by mouth every evening.     [provider]  atorvastatin (LIPITOR) 80 MG tablet TAKE 1 TABLET BY MOUTH ONCE DAILY AT  6  IN  THE  EVENING 08/05/19   Strader, [place] M, PA-C  carvedilol (COREG) 3.125 MG tablet Take 1 tablet (3.125 mg total) by mouth 2 (two) times daily with a meal.  08/12/19   Larey Dresser, MD  clopidogrel (PLAVIX) 75 MG tablet Take 1 tablet by mouth once daily 10/08/19   Herminio Commons, MD  dapagliflozin propanediol (FARXIGA) 10 MG TABS tablet Take 10 mg by mouth daily before breakfast. 09/26/19   Larey Dresser, MD  ezetimibe (ZETIA) 10 MG tablet Take 1 tablet by mouth once daily 05/08/19   Herminio Commons, MD  gabapentin (NEURONTIN) 100 MG capsule Take 100 mg by mouth 2 (two) times daily.     [provider]  icosapent Ethyl ([name]) 1 g capsule Take 2 capsules (2 g total) by mouth 2 (two) times daily. 09/12/19   Larey Dresser, MD  loperamide (IMODIUM) 2 MG capsule Take 2 mg by mouth 4 (four) times daily as needed for diarrhea or loose stools (ibs).    [provider]  LORazepam (ATIVAN) 1 MG tablet Take 1 mg by mouth daily as needed for anxiety.  11/09/17   [provider]  Multiple Vitamins-Minerals (MULTIVITAMIN WITH MINERALS) tablet Take 1 tablet by mouth daily.    [provider]  nicotine (NICODERM CQ - DOSED IN MG/24 HOURS) 21 mg/24hr patch Place 21 mg onto the skin daily.    [provider]  nitroGLYCERIN (NITROSTAT) 0.4 MG SL tablet Place 1 tablet (0.4 mg total) under the tongue every 5 (five) minutes x 3 doses as needed for chest pain. 03/05/18   Manuella Ghazi, Pratik D, DO  rOPINIRole (REQUIP) 0.5 MG tablet Take 0.5 mg by mouth at bedtime.  11/13/17   [provider]  sacubitril-valsartan (ENTRESTO) 49-51 MG Take 1 tablet by mouth 2 (two) times daily. 09/03/19   Larey Dresser, MD  spironolactone (ALDACTONE) 25 MG tablet Take 1/2 (one-half) tablet by mouth once daily 10/14/19   Larey Dresser, MD  zinc gluconate 50 MG tablet Take 50 mg by mouth daily.    [provider]    Allergies    Tape  Review of Systems   Review of Systems  All other systems reviewed and are negative.   Physical  Exam Updated Vital Signs BP 111/78   Pulse 77   Temp 98.2 F (36.8 C) (Oral)   Resp 16    Ht 5' (1.524 m)   Wt 60.3 kg   SpO2 100%   BMI 25.97 kg/m   Physical Exam Vitals and nursing note reviewed. Exam conducted with a chaperone present.  Constitutional:      General: She is not in acute distress.    Appearance: Normal appearance. She is not ill-appearing or diaphoretic.  HENT:     Head: Normocephalic and atraumatic.  Eyes:     General: No scleral icterus.    Conjunctiva/sclera: Conjunctivae normal.  Cardiovascular:     Rate and Rhythm: Normal rate and regular rhythm.     Pulses: Normal pulses.     Heart sounds: Normal heart sounds.  Pulmonary:     Effort: Pulmonary effort is normal. No respiratory distress.     Breath sounds: Normal breath sounds. No wheezing or rales.     Comments: No increased work of breathing. Musculoskeletal:     Cervical back: Normal range of motion.     Right lower leg: No edema.     Left lower leg: No edema.  Skin:    General: Skin is dry.     Capillary Refill: Capillary refill takes less than 2 seconds.     Comments: Livedo reticularis pattern on legs.  Cool lower extremities.  (Chronic).  Easy bruising throughout (Plavix and aspirin).  Neurological:     Mental Status: She is alert and oriented to person, place, and time.     GCS: GCS eye subscore is 4. GCS verbal subscore is 5. GCS motor subscore is 6.  Psychiatric:        Mood and Affect: Mood normal.        Behavior: Behavior normal.        Thought Content: Thought content normal.     ED Results / Procedures / Treatments   Labs (all labs ordered are listed, but only abnormal results are displayed) Labs Reviewed  BASIC METABOLIC PANEL - Abnormal; Notable for the following components:      Result Value   Glucose, Bld 106 (*)    BUN 33 (*)    Creatinine, Ser 1.22 (*)    GFR calc non Af Amer 49 (*)    GFR calc Af Amer 57 (*)    All other components within normal limits  CBC - Abnormal; Notable for the following components:   Hemoglobin 11.6 (*)    HCT 35.1 (*)    RDW  16.5 (*)    Platelets 439 (*)    All other components within normal limits  TROPONIN I (HIGH SENSITIVITY)  TROPONIN I (HIGH SENSITIVITY)    EKG None  Radiology DG Chest 2 View  Result Date: 10/22/2019 CLINICAL DATA:  Chest heaviness and shortness of breath since 10 a.m. this morning. EXAM: CHEST - 2 VIEW COMPARISON:  Single-view of the chest 06/25/2019. CT chest 05/10/2019 FINDINGS: Lungs clear. Heart size normal. No pneumothorax or pleural fluid. No acute or focal bony abnormality. Degenerative change about the shoulders noted. IMPRESSION: No acute disease. Electronically Signed   By: Inge Rise M.D.   On: 10/22/2019 12:59   VAS US CAROTID  Result Date: 10/21/2019 Carotid Arterial Duplex Study Indications:       Carotid stenosis. Risk Factors:      Hyperlipidemia, current smoker, prior MI, coronary artery  disease, PAD. Other Factors:     History of intracranial aneurysm with stenting. Comparison Study:  Prior study from 07/05/19 is available for comparison Performing Technologist: Sharion Dove RVS  Examination Guidelines: A complete evaluation includes B-mode imaging, spectral Doppler, color Doppler, and power Doppler as needed of all accessible portions of each vessel. Bilateral testing is considered an integral part of a complete examination. Limited examinations for reoccurring indications may be performed as noted.  Right Carotid Findings: +----------+--------+--------+--------+---------------------+------------------+           PSV cm/sEDV cm/sStenosisPlaque Description   Comments           +----------+--------+--------+--------+---------------------+------------------+ CCA Prox  89      30                                   intimal thickening +----------+--------+--------+--------+---------------------+------------------+ CCA Distal77      32                                   intimal thickening  +----------+--------+--------+--------+---------------------+------------------+ ICA Prox  104     46      40-59%  heterogenous, focal                                                       and calcific                            +----------+--------+--------+--------+---------------------+------------------+ ICA Mid   101     45                                                      +----------+--------+--------+--------+---------------------+------------------+ ICA Distal89      34                                                      +----------+--------+--------+--------+---------------------+------------------+ ECA       151     25                                                      +----------+--------+--------+--------+---------------------+------------------+ +----------+--------+-------+--------+-------------------+           PSV cm/sEDV cmsDescribeArm Pressure (mmHG) +----------+--------+-------+--------+-------------------+ [id number]                                        +----------+--------+-------+--------+-------------------+ +---------+--------+--+--------+--+ VertebralPSV cm/s32EDV cm/s14 +---------+--------+--+--------+--+  Left Carotid Findings: +----------+--------+--------+--------+------------------+------------------+           PSV cm/sEDV cm/sStenosisPlaque DescriptionComments           +----------+--------+--------+--------+------------------+------------------+ CCA Prox  59      21  intimal thickening +----------+--------+--------+--------+------------------+------------------+ CCA Distal72      33                                intimal thickening +----------+--------+--------+--------+------------------+------------------+ ICA Prox  139     51      40-59%  calcific          Shadowing          +----------+--------+--------+--------+------------------+------------------+ ICA Mid    132     36                                                   +----------+--------+--------+--------+------------------+------------------+ ICA Distal129     47                                                   +----------+--------+--------+--------+------------------+------------------+ ECA       86      18                                                   +----------+--------+--------+--------+------------------+------------------+ +----------+--------+--------+--------+-------------------+           PSV cm/sEDV cm/sDescribeArm Pressure (mmHG) +----------+--------+--------+--------+-------------------+ Subclavian69                                          +----------+--------+--------+--------+-------------------+ +---------+--------+--+--------+--+ VertebralPSV cm/s55EDV cm/s22 +---------+--------+--+--------+--+ Unable to match prior velocities secondary to significant calcific plaque at origin and proximal ICA  Summary: Right Carotid: Velocities in the right ICA are consistent with a 40-59%                stenosis. Left Carotid: Velocities in the left ICA are consistent with a 40-59% stenosis. Vertebrals:  Bilateral vertebral arteries demonstrate antegrade flow. Subclavians: Normal flow hemodynamics were seen in bilateral subclavian              arteries. *See table(s) above for measurements and observations.  Electronically signed by Curt Jews MD on 10/21/2019 at 5:34:55 PM.    Final     Procedures Procedures (including critical care time)  Medications Ordered in ED Medications - No data to display  ED Course  I have reviewed the triage vital signs and the nursing notes.  Pertinent labs & imaging results that were available during my care of the patient were reviewed by me and considered in my medical decision making (see chart for details).    MDM Rules/Calculators/A&P                          Labs: CBC: Notable for mild anemia with hemoglobin of 11.6,  consistent with baseline. BMP: Notable for mild renal impairment with elevated BUN to 33, elevated creatinine 1.22, diminished GFR to 49.  These are decreased from normal limits.   Troponin: Trended x2 and stable at 5.  Imaging: DG chest 2 view: No acute cardiopulmonary findings.  EKG:  NSR.  Patient states that her symptoms have predominantly abated.  Her vital signs have been stable and within normal limits.  Unsure as to what triggered her episode of vibratory chest symptoms with associated palpitations and nausea.  She states that she does not feel short of breath and never had any true chest pain.  Her recent cardiac catheterization reveals that she has patent stents and nonobstructive CAD.  Given her reassuring work-up and vital signs, feel as though patient is safe for discharge at this time with appropriate follow-up.  She will need to follow-up with her CHF doctor, Dr. Algernon Huxley, at her scheduled appointment on 10/24/2019.  I would also like for her to follow-up with her cardiologist Dr. Bronson Ing.  Patient denies any history of blood clots and she is negative per her [place] PE but stratification.  Cannot PERC her out due to her age, but otherwise she would be PERC negative.  No unilateral extremity edema.  No pleuritic symptoms.  Discussed with Dr. Roderic Palau who agrees that she is reasonable for outpatient follow-up.   Strict ED return precautions discussed.  All of the evaluation and work-up results were discussed with the patient and any family at bedside. They were provided opportunity to ask any additional questions and have none at this time. They have expressed understanding of verbal discharge instructions as well as return precautions and are agreeable to the plan.    Final Clinical Impression(s) / ED Diagnoses Final diagnoses:  Atypical chest pain    Rx / DC Orders ED Discharge Orders    None       Corena Herter, PA-C 10/22/19 1919    Corena Herter, PA-C 10/22/19  Luanna Cole, MD 10/22/19 2231

## 2019-10-22 NOTE — Telephone Encounter (Signed)
Pt agreed to f/u in 6 months with us carotid. AW 

## 2019-10-23 NOTE — Progress Notes (Deleted)
PCP: [name], MD Cardiology: [name] HF Cardiology: [name]   HPI: 59 y.o. with history of CAD, ischemic cardiomyopathy, and PAD who was referred by [name] to [name] for evaluation of CHF. She had an NSTEMI in 11/17 with PCI to proximal/mid LCx and RCA. Subsequently, she has developed ischemic cardiomyopathy. Most recent echo in 05/2019 showed EF 30-35%. 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    Patient had exertional chest heaviness and dyspnea. RHC/LHC on 08/2019 showed normal filling pressures, preserved cardiac output, and nonobstructive mild CAD. ABIs were normal in 08/2019.     At the last HF clinic visit with [name] on 09/03/19, she was still having chest tightness at work but thought it may be due to stress. She endorsed that she experiences SOB walking upstairs but does ok on flat ground. No orthopnea/PND. No lightheadedness. She had cut back to about 1-2 cigarettes/week.  Recently presented to HF clinic for pharmacist medication titration on 09/26/19. Symptomatically, she was doing well. She endorsed some mild dizziness and lightheadedness, but she was not sure if this was related to her heart failure. She denied chest pain but stated she sometimes has palpitations due to stress from her work. She only gets SOB when walking upstairs but can walk on flat ground without issues. She was able to complete all ADLs and reported being moderately active. 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 She reported checking her weight at home daily (normal range 132-134 lbs). No LEE, PND, or orthopnea. Her appetite had decreased lately. She was trying to eat better since she was told that her cholesterol was high. She reported adherence to a low-salt diet.   Today she returns to HF clinic for pharmacist medication titration. At recent visits to clinic, Erin [name] was increased to 49/51 mg BID,  spironolactone was increased to 25 mg daily and Farxiga 10 mg daily was initiated. Additionally, furosemide and potassium supplementation were discontinued. Of note, she did have an ED visit on 10/22/19 for vibrational chest pain. All work-up was negative and patient was discharged home. Atypical chest pain has resolved and patient now thinking it may be due to recent shoulder injury experienced at work. BP at home is normally low 100s, which is normal for her since the heart attack. Denies lightheadedness, dizziness, syncope. Patient denies lower extremity swelling, shortness of breath, orthopnea/PND. Home weight has been stable around 129-133 lbs. Patient able to sleep on one pillow now. She works her entire 12-hour shift where she walks 9000 steps a day without shortness of breath. Only time she experiences shortness of breath is going up and down stairs, which she uses as exercise on non-workdays. She also plays tennis and goes swimming with her children to stay active. Has had a poor appetite mostly because she does not enjoy heart healthy diet. She avoids fried food, red meats, and eating out. She eats baked chicken and fish but does not find them as appetizing. She also eats carrots, broccoli, and celery. She smokes about 3 cigarettes in a week, which is about the same as before.   HF Medications: Carvedilol 3.125 mg BID Entresto 49/51 mg BID Spironolactone 25 mg daily Farxiga 10 mg daily  Has the patient been experiencing any side effects to the medications prescribed?  no  Does the patient have any problems obtaining medications due to transportation or finances?   No,  she has new Pharmacist, community and has been given co-pay cards for [name], and [name] which are affordable for her at this time.   Understanding of regimen: good Understanding of indications: good Potential of compliance: good Patient understands to avoid NSAIDs. Patient understands to avoid decongestants.    Pertinent Lab Values 10/22/19: . Serum creatinine 1.22 (0.88 on 09/12/19), BUN 33 (14 on 09/12/19), Potassium 4.7, Sodium 136, BNP 80 pg/mL (06/25/19)  Vital Signs: . Weight: 129.2 lbs (last clinic weight: 133 lbs) . Blood pressure: 110/78 . Heart rate: 82  Assessment: 1. CAD: S/p NSTEMI in 11/17 with DEX to LCx and DES x 2 to RCA. Cardiolite in 10/20 with EF <30%, inferior/inferolateral infarct with peri-infarct ischemia. LHC in 4/21 showed nonobstructive mild CAD. Suspect chest pain at work may be due to stress and shoulder injury. Patient instructed to use acetaminophen for pain and avoid NSAIDs - Continue atorvastatin and ezetimibe. LDL controlled (67 mg/dL 09/03/19) - Continue ASA 81 mg and Plavix 75 mg daily per MD. She will likely be a good candidate to replace Plavix with rivaroxaban 2.5 mg bid in the future. She will continue Plavix as long as Dr. Estanislado Pandy feels it is needed post her cranial circulation intervention.  -Continue [name] 2g BID  2. Chronic systolic CHF: Ischemic cardiomyopathy. Last echo in 1/21 with EF 30-35%. RHC in 4/21 with normal filling pressures and preserved cardiac output.  - NYHA class II - She is slightly dry on exam and on BMET.  - She is no longer taking any diuretic.  - BP 110/78, HR 82, which is stable - Increase carvedilol to 6.25 mg BID - Continue Entresto 49/51 mg BID   - Continue spironolactone 25 mg daily - Continue dapagliflozin 10 mg daily for now. Instructed patient to try to increase fluid intake slightly due to being dry on exam. Recommend ordering BMET at next office visit. - Narrow QRS so not CRT candidate - Repeat echo per MD after medication titration and cath, she may need an ICD.   3. Carotid stenosis: Repeat carotid dopplers in 02/2020.    4. Smoking: Strongly encouraged her to quit. She is cutting back a lot and is using nicotine patches, which has helped.   5. 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   Plan: 1) Medication  changes: Based on clinical presentation, vital signs and recent labs will increase carvedilol to 12.5 mg BID 2.) No labs at this visit due to recent BMET on 10/22/19. Scr and BUN were elevated but felt to be acceptable to continue [place] for now. Recommend obtaining BMET at next office visit.  2) Follow-up: office visit with [name] in 3 weeks  Erin [name], PharmD PGY1 [place] Resident  Erin [name], PharmD, BCPS, [facility], CPP Heart Failure Clinic Pharmacist [phone]

## 2019-10-24 ENCOUNTER — Other Ambulatory Visit: Payer: Self-pay

## 2019-10-24 ENCOUNTER — Ambulatory Visit (HOSPITAL_COMMUNITY)
Admission: RE | Admit: 2019-10-24 | Discharge: 2019-10-24 | Disposition: A | Discharge status: Home / Self Care | Payer: PRIVATE HEALTH INSURANCE | Source: Ambulatory Visit | Attending: Internal Medicine | Admitting: Internal Medicine

## 2019-10-24 VITALS — BP 110/78 | HR 82 | Wt 129.2 lb

## 2019-10-24 DIAGNOSIS — Z79899 Other long term (current) drug therapy: Secondary | ICD-10-CM | POA: Insufficient documentation

## 2019-10-24 DIAGNOSIS — F17210 Nicotine dependence, cigarettes, uncomplicated: Secondary | ICD-10-CM | POA: Insufficient documentation

## 2019-10-24 DIAGNOSIS — Z955 Presence of coronary angioplasty implant and graft: Secondary | ICD-10-CM | POA: Diagnosis not present

## 2019-10-24 DIAGNOSIS — I6529 Occlusion and stenosis of unspecified carotid artery: Secondary | ICD-10-CM | POA: Diagnosis not present

## 2019-10-24 DIAGNOSIS — I671 Cerebral aneurysm, nonruptured: Secondary | ICD-10-CM | POA: Diagnosis not present

## 2019-10-24 DIAGNOSIS — I739 Peripheral vascular disease, unspecified: Secondary | ICD-10-CM | POA: Insufficient documentation

## 2019-10-24 DIAGNOSIS — I255 Ischemic cardiomyopathy: Secondary | ICD-10-CM | POA: Insufficient documentation

## 2019-10-24 DIAGNOSIS — Z7902 Long term (current) use of antithrombotics/antiplatelets: Secondary | ICD-10-CM | POA: Insufficient documentation

## 2019-10-24 DIAGNOSIS — I252 Old myocardial infarction: Secondary | ICD-10-CM | POA: Diagnosis not present

## 2019-10-24 DIAGNOSIS — Z7982 Long term (current) use of aspirin: Secondary | ICD-10-CM | POA: Insufficient documentation

## 2019-10-24 DIAGNOSIS — I2510 Atherosclerotic heart disease of native coronary artery without angina pectoris: Secondary | ICD-10-CM | POA: Insufficient documentation

## 2019-10-24 DIAGNOSIS — I5022 Chronic systolic (congestive) heart failure: Secondary | ICD-10-CM | POA: Insufficient documentation

## 2019-10-24 DIAGNOSIS — I5042 Chronic combined systolic (congestive) and diastolic (congestive) heart failure: Secondary | ICD-10-CM

## 2019-10-24 MED ORDER — CARVEDILOL 6.25 MG PO TABS
6.2500 mg | ORAL_TABLET | Freq: Two times a day (BID) | ORAL | 3 refills | Status: DC
Start: 2019-10-24 — End: 2019-11-12

## 2019-10-24 NOTE — Patient Instructions (Signed)
It was a pleasure seeing you today!  MEDICATIONS: -We are changing your medications today -Increase carvedilol to 6.25 mg (1 tablet) twice daily. You may take 2 tablets of the lower strength twice daily until you pick up the new strength.  -Call if you have questions about your medications.   NEXT APPOINTMENT: Return to clinic in 3 weeks with [name].  In general, to take care of your heart failure: -Limit your fluid intake to 2 Liters (half-gallon) per day.   -Limit your salt intake to ideally 2-3 grams (2000-3000 mg) per day. -Weigh yourself daily and record, and bring that "weight diary" to your next appointment.  (Weight gain of 2-3 pounds in 1 day typically means fluid weight.) -The medications for your heart are to help your heart and help you live longer.   -Please contact us before stopping any of your heart medications.  Call the clinic at [phone] with questions or to reschedule future appointments.

## 2019-10-25 NOTE — Progress Notes (Signed)
PCP: [name], MD Cardiology: [name] HF Cardiology: [name]   HPI: 59 y.o. with history of CAD, ischemic cardiomyopathy, and PAD who was referred by [name] to [name] for evaluation of CHF. She had an NSTEMI in 11/17 with PCI to proximal/mid LCx and RCA. Subsequently, she has developed ischemic cardiomyopathy. Most recent echo in 05/2019 showed EF 30-35%. 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    Patient had exertional chest heaviness and dyspnea. RHC/LHC on 08/2019 showed normal filling pressures, preserved cardiac output, and nonobstructive mild CAD. ABIs were normal in 08/2019.     At the last HF clinic visit with [name] on 09/03/19, she was still having chest tightness at work but thought it may be due to stress. She endorsed that she experiences SOB walking upstairs but does ok on flat ground. No orthopnea/PND. No lightheadedness. She had cut back to about 1-2 cigarettes/week.  Recently presented to HF clinic for pharmacist medication titration on 09/26/19. Symptomatically, she was doing well. She endorsed some mild dizziness and lightheadedness, but she was not sure if this was related to her heart failure. She denied chest pain but stated she sometimes has palpitations due to stress from her work. She only gets SOB when walking upstairs but can walk on flat ground without issues. She was able to complete all ADLs and reported being moderately active. 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 She reported checking her weight at home daily (normal range 132-134 lbs). No LEE, PND, or orthopnea. Her appetite had decreased lately. She was trying to eat better since she was told that her cholesterol was high. She reported adherence to a low-salt diet.   Today she returns to HF clinic for pharmacist medication titration. At recent visits to clinic, [name] was increased to 49/51 mg BID,  spironolactone was increased to 25 mg daily and Farxiga 10 mg daily was initiated. Additionally, furosemide and potassium supplementation were discontinued. Of note, she did have an ED visit on 10/22/19 for vibrational chest pain. All work-up was negative and patient was discharged home. Atypical chest pain has resolved and patient now thinking it may have been due to recent shoulder injury experienced at work. BP at home is normally low 100s, which is normal for her since the heart attack. Denies lightheadedness, dizziness, syncope. Patient denies lower extremity swelling, shortness of breath, orthopnea/PND. Home weight has been stable around 129-133 lbs. Patient able to sleep on one pillow now. She works her entire 12-hour shift where she walks 9000 steps a day without shortness of breath. Only time she experiences shortness of breath is going up and down stairs, which she uses as exercise on non-workdays. She also plays tennis and goes swimming with her children to stay active. Has had a poor appetite mostly because she does not enjoy heart healthy diet. She avoids fried food, red meats, and eating out. She eats baked chicken and fish but does not find them as appetizing.  She smokes about 3 cigarettes in a week, which is about the same as before.   HF Medications: Carvedilol 3.125 mg BID Entresto 49/51 mg BID Spironolactone 25 mg daily Farxiga 10 mg daily  Has the patient been experiencing any side effects to the medications prescribed?  no  Does the patient have any problems obtaining medications due to transportation or finances?   No, she has new Pharmacist, community  and has been given co-pay cards for [name], and [name] which are affordable for her at this time.   Understanding of regimen: good Understanding of indications: good Potential of compliance: good Patient understands to avoid NSAIDs. Patient understands to avoid decongestants.   Pertinent Lab Values 10/22/19: . Serum  creatinine 1.22 (0.88 on 09/12/19), BUN 33 (14 on 09/12/19), Potassium 4.7, Sodium 136, BNP 80 pg/mL (06/25/19)  Vital Signs: . Weight: 129.2 lbs (last clinic weight: 133 lbs) . Blood pressure: 110/78 . Heart rate: 82  Assessment: 1. CAD: S/p NSTEMI in 11/17 with DEX to LCx and DES x 2 to RCA. Cardiolite in 10/20 with EF <30%, inferior/inferolateral infarct with peri-infarct ischemia. LHC in 4/21 showed nonobstructive mild CAD. Suspect chest pain at work may be due to stress and shoulder injury. Patient instructed to use acetaminophen for pain and avoid NSAIDs - Continue atorvastatin and ezetimibe. LDL controlled (67 mg/dL 09/03/19) - Continue ASA 81 mg and Plavix 75 mg daily per MD. She will likely be a good candidate to replace Plavix with rivaroxaban 2.5 mg bid in the future. She will continue Plavix as long as Dr. Estanislado Pandy feels it is needed post her cranial circulation intervention.  -Continue [name] 2g BID  2. Chronic systolic CHF: Ischemic cardiomyopathy. Last echo in 1/21 with EF 30-35%. RHC in 4/21 with normal filling pressures and preserved cardiac output.  - NYHA class II - She is slightly dry on exam and on BMET. Encouraged hydration.  - She is no longer taking any diuretic.  - BP 110/78, HR 82, which is stable - Increase carvedilol to 6.25 mg BID - Continue Entresto 49/51 mg BID   - Continue spironolactone 25 mg daily - Continue dapagliflozin 10 mg daily. - Narrow QRS so not CRT candidate - Repeat echo per MD after medication titration and cath, she may need an ICD.   3. Carotid stenosis: Repeat carotid dopplers in 02/2020.    4. Smoking: Strongly encouraged her to quit. She is cutting back a lot and is using nicotine patches, which has helped.   5. 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   Plan: 1) Medication changes: Based on clinical presentation, vital signs and recent labs will increase carvedilol to 6.25 mg BID 2.) No labs at this visit due to recent BMET on  10/22/19. Scr and BUN were elevated but felt to be acceptable to continue [place] for now. Recommend obtaining BMET at next office visit.  2) Follow-up: office visit with [name] in 3 weeks  Sherren Kerns, PharmD PGY1 [place] Resident  [name], PharmD, BCPS, [facility], CPP Heart Failure Clinic Pharmacist [phone]

## 2019-11-11 ENCOUNTER — Other Ambulatory Visit (HOSPITAL_COMMUNITY): Payer: No Typology Code available for payment source

## 2019-11-12 ENCOUNTER — Ambulatory Visit (HOSPITAL_COMMUNITY)
Admission: RE | Admit: 2019-11-12 | Discharge: 2019-11-12 | Disposition: A | Discharge status: Home / Self Care | Payer: PRIVATE HEALTH INSURANCE | Source: Ambulatory Visit | Attending: Cardiology | Admitting: Cardiology

## 2019-11-12 ENCOUNTER — Encounter (HOSPITAL_COMMUNITY): Payer: Self-pay | Admitting: Cardiology

## 2019-11-12 ENCOUNTER — Other Ambulatory Visit: Payer: Self-pay

## 2019-11-12 VITALS — BP 106/68 | HR 86 | Wt 130.0 lb

## 2019-11-12 DIAGNOSIS — Z7982 Long term (current) use of aspirin: Secondary | ICD-10-CM | POA: Diagnosis not present

## 2019-11-12 DIAGNOSIS — Z8249 Family history of ischemic heart disease and other diseases of the circulatory system: Secondary | ICD-10-CM | POA: Insufficient documentation

## 2019-11-12 DIAGNOSIS — I5042 Chronic combined systolic (congestive) and diastolic (congestive) heart failure: Secondary | ICD-10-CM

## 2019-11-12 DIAGNOSIS — F17210 Nicotine dependence, cigarettes, uncomplicated: Secondary | ICD-10-CM | POA: Diagnosis not present

## 2019-11-12 DIAGNOSIS — Z79899 Other long term (current) drug therapy: Secondary | ICD-10-CM | POA: Diagnosis not present

## 2019-11-12 DIAGNOSIS — Z7984 Long term (current) use of oral hypoglycemic drugs: Secondary | ICD-10-CM | POA: Insufficient documentation

## 2019-11-12 DIAGNOSIS — I2510 Atherosclerotic heart disease of native coronary artery without angina pectoris: Secondary | ICD-10-CM | POA: Diagnosis not present

## 2019-11-12 DIAGNOSIS — I739 Peripheral vascular disease, unspecified: Secondary | ICD-10-CM | POA: Insufficient documentation

## 2019-11-12 DIAGNOSIS — I5022 Chronic systolic (congestive) heart failure: Secondary | ICD-10-CM | POA: Diagnosis present

## 2019-11-12 DIAGNOSIS — Z955 Presence of coronary angioplasty implant and graft: Secondary | ICD-10-CM | POA: Diagnosis not present

## 2019-11-12 DIAGNOSIS — I6523 Occlusion and stenosis of bilateral carotid arteries: Secondary | ICD-10-CM | POA: Diagnosis not present

## 2019-11-12 DIAGNOSIS — Z7902 Long term (current) use of antithrombotics/antiplatelets: Secondary | ICD-10-CM | POA: Diagnosis not present

## 2019-11-12 DIAGNOSIS — I255 Ischemic cardiomyopathy: Secondary | ICD-10-CM | POA: Diagnosis not present

## 2019-11-12 DIAGNOSIS — R0789 Other chest pain: Secondary | ICD-10-CM | POA: Diagnosis not present

## 2019-11-12 DIAGNOSIS — I252 Old myocardial infarction: Secondary | ICD-10-CM | POA: Insufficient documentation

## 2019-11-12 LAB — BASIC METABOLIC PANEL
Anion gap: 7 (ref 5–15)
BUN: 16 mg/dL (ref 6–20)
CO2: 25 mmol/L (ref 22–32)
Calcium: 9.3 mg/dL (ref 8.9–10.3)
Chloride: 108 mmol/L (ref 98–111)
Creatinine, Ser: 0.84 mg/dL (ref 0.44–1.00)
GFR calc Af Amer: >60 mL/min (ref >60)
GFR calc non Af Amer: >60 mL/min (ref >60)
Glucose, Bld: 106 mg/dL — ABNORMAL HIGH (ref 70–99)
Potassium: 4.3 mmol/L (ref 3.5–5.1)
Sodium: 140 mmol/L (ref 135–145)

## 2019-11-12 MED ORDER — CARVEDILOL 12.5 MG PO TABS
12.5000 mg | ORAL_TABLET | Freq: Two times a day (BID) | ORAL | 3 refills | Status: DC
Start: 2019-11-12 — End: 2019-12-11

## 2019-11-12 NOTE — Patient Instructions (Signed)
  INCREASE Carvedilol to 9.375mg  twice daily for 4 days and then increase to 12.5mg  twice daily.  Routine lab work today. Will notify you of abnormal results  Follow up with PharmD for medication titration in 1 month  Follow up and echo in 3 months  Do the following things EVERYDAY: 1) Weigh yourself in the morning before breakfast. Write it down and keep it in a log. 2) Take your medicines as prescribed 3) Eat low salt foods--Limit salt (sodium) to 2000 mg per day.  4) Stay as active as you can everyday 5) Limit all fluids for the day to less than 2 liters

## 2019-11-14 NOTE — Progress Notes (Signed)
PCP: [name], MD Cardiology: [name] HF Cardiology: [name]  59 y.o. with history of CAD, ischemic cardiomyopathy, and PAD was referred by [name] for evaluation of CHF.  She had NSTEMI in 11/17 with PCI to prox/mid LCx and RCA.  Subsequently, has developed ischemic cardiomyopathy.  Most recent echo in 1/21 showed EF 30-35%.  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   RHC/LHC done with exertional chest heaviness and dyspnea in 4/21 showed normal filling pressures, preserved cardiac output, and nonobstructive mild CAD.  ABIs were normal in 4/21.   She returns for followup of CHF.  Rare smoking now.  No significant exertional dyspnea.  Rare atypical chest pain. No orthopnea/PND.  Weight down 3 lbs. No lightheadedness.   Labs (2/21): K 3.7, creatinine 0.82 Labs (4/21): K 4.3, creatinine 0.81, LDL 67, TGs 286 Labs (6/21): K 4.7, creatinine 1.22  PMH: 1. CAD: NSTEMI with DES to proximal and mid LCx in 11/17, staged DES x 2 to RCA later in 11/17.  - Cardiolite (10/20): EF < 30%, large inferior and inferolateral MI with mild peri-infarct ischemia.  - LHC (4/21): Patent stents, nonobstructive CAD.  2. Chronic systolic CHF: Ischemic cardiomyopathy.  - Echo (10/20): EF 35-40%, lateral WMAs, normal RV. - Echo (1/21): EF 30-35%, mild LVH, normal RV - RHC (4/21): mean RA 5, PA 29/3, mean PCWP 12, CI 3.04 3. 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  4. Carotid stenosis: Carotid dopplers (50/27) with 74-12% LICA stenosis.  - Carotid dopplers (6/21): 40-59% BICA stenosis.  5. Active smoker.  6. ABIs (4/21): Normal.   Social History   Socioeconomic History  . Marital status: Married    Spouse name: Not on file  . Number of children: Not on file  . Years of education: Not on file  . Highest education level: Not on file  Occupational History  . Occupation: CNA  Tobacco Use  . Smoking status: Current Some  Day Smoker    Packs/day: 0.10    Years: 15.00    Pack years: 1.50    Types: Cigarettes  . Smokeless tobacco: Never Used  . Tobacco comment: smokes a cigarette occasionally  Vaping Use  . Vaping Use: Never used  Substance and Sexual Activity  . Alcohol use: Not Currently    Comment: occasionally  . Drug use: Not Currently    Types: Marijuana    Comment: former- 2017 last time  . Sexual activity: Not Currently    Birth control/protection: Surgical    Comment: hyst  Other Topics Concern  . Not on file  Social History Narrative   Lives with husband in [place] in a one story home with a basement.  Has 4 children.  Works as a Quarry manager.  Education: CNA school.    Social Determinants of Health   Financial Resource Strain:   . Difficulty of Paying Living Expenses:   Food Insecurity:   . Worried About Charity fundraiser in the Last Year:   . Arboriculturist in the Last Year:   Transportation Needs:   . Film/video editor (Medical):   [name] Kitchen Lack of Transportation (Non-Medical):   Physical Activity:   . Days of Exercise per Week:   . Minutes of Exercise per Session:   Stress:   . Feeling of Stress :   Social Connections:   . Frequency of Communication with Friends and Family:   . Frequency of Social Gatherings with Friends  and Family:   . Attends Religious Services:   . Active Member of Clubs or Organizations:   . Attends Archivist Meetings:   [name] Kitchen Marital Status:   Intimate Partner Violence:   . Fear of Current or Ex-Partner:   . Emotionally Abused:   [name] Kitchen Physically Abused:   . Sexually Abused:    Family History  Problem Relation Age of Onset  . Diabetes Father   . Hypertension Father   . CAD Father   . Colon polyps Father 60       pre-cancerous   . Stroke Father   . Dementia Father   . Stroke Mother   . Hypertension Mother   . Diabetes Mother   . Heart failure Other   . Breast cancer Maternal Grandmother   . Colon cancer Neg Hx    ROS: All systems reviewed and  negative except as per HPI.   Current Outpatient Medications  Medication Sig Dispense Refill  . acetaminophen (TYLENOL) 325 MG tablet Take 325 mg by mouth every 6 (six) hours as needed for mild pain or headache.     . ALPRAZolam (XANAX) 1 MG tablet Take 1-2 mg by mouth at bedtime as needed for anxiety.     [name] Kitchen aspirin EC 81 MG tablet Take 81 mg by mouth every evening.     [name] Kitchen atorvastatin (LIPITOR) 80 MG tablet TAKE 1 TABLET BY MOUTH ONCE DAILY AT  6  IN  THE  EVENING 90 tablet 2  . carvedilol (COREG) 12.5 MG tablet Take 1 tablet (12.5 mg total) by mouth 2 (two) times daily. 180 tablet 3  . clopidogrel (PLAVIX) 75 MG tablet Take 1 tablet by mouth once daily 60 tablet 3  . dapagliflozin propanediol (FARXIGA) 10 MG TABS tablet Take 10 mg by mouth daily before breakfast. 30 tablet 11  . ezetimibe (ZETIA) 10 MG tablet Take 1 tablet by mouth once daily 30 tablet 6  . gabapentin (NEURONTIN) 100 MG capsule Take 100 mg by mouth 2 (two) times daily.     [name] Kitchen icosapent Ethyl ([name]) 1 g capsule Take 2 capsules (2 g total) by mouth 2 (two) times daily. 120 capsule 4  . loperamide (IMODIUM) 2 MG capsule Take 2 mg by mouth 4 (four) times daily as needed for diarrhea or loose stools (ibs).     . LORazepam (ATIVAN) 1 MG tablet Take 1 mg by mouth daily as needed for anxiety.   1  . Multiple Vitamins-Minerals (MULTIVITAMIN WITH MINERALS) tablet Take 1 tablet by mouth daily.    . nicotine (NICODERM CQ - DOSED IN MG/24 HOURS) 21 mg/24hr patch Place 21 mg onto the skin daily.    [name] Kitchen rOPINIRole (REQUIP) 0.5 MG tablet Take 0.5 mg by mouth at bedtime.   0  . sacubitril-valsartan (ENTRESTO) 49-51 MG Take 1 tablet by mouth 2 (two) times daily. 60 tablet 3  . spironolactone (ALDACTONE) 25 MG tablet Take 25 mg by mouth daily.    [name] Kitchen zinc gluconate 50 MG tablet Take 50 mg by mouth daily.    . nitroGLYCERIN (NITROSTAT) 0.4 MG SL tablet Place 1 tablet (0.4 mg total) under the tongue every 5 (five) minutes x 3 doses as needed for  chest pain. (Patient not taking: Reported on 11/12/2019) 30 tablet 12   No current facility-administered medications for this encounter.   BP 106/68   Pulse 86   Wt 59 kg (130 lb)   SpO2 99%   BMI 25.39 kg/m  General: NAD Neck:  No JVD, no thyromegaly or thyroid nodule.  Lungs: Clear to auscultation bilaterally with normal respiratory effort. CV: Nondisplaced PMI.  Heart regular S1/S2, no S3/S4, no murmur.  No peripheral edema.  No carotid bruit.  Normal pedal pulses.  Abdomen: Soft, nontender, no hepatosplenomegaly, no distention.  Skin: Intact without lesions or rashes.  Neurologic: Alert and oriented x 3.  Psych: Normal affect. Extremities: No clubbing or cyanosis.  HEENT: Normal.   Assessment/Plan: 1. CAD: S/p NSTEMI in 11/17 with DEX to LCx and DES x 2 to RCA.  Cardiolite in 10/20 with EF <30%, inferior/inferolateral infarct with peri-infarct ischemia. LHC in 4/21 showed nonobstructive mild CAD.  Rare atypical chest pain.  - Continue atorvastatin and Zetia.  Good lipids in 4/21.  - Continue ASA 81 and Plavix 75 mg daily for now.  She will likely be a good candidate to replace Plavix with rivaroxaban 2.5 mg bid in the future. She will continue Plavix as long as Dr. Estanislado Pandy feels it is needed post her cranial circulation intervention.  2. Chronic systolic CHF: Ischemic cardiomyopathy. Last echo in 1/21 with EF 30-35%.  RHC in 4/21 with normal filling pressures and preserved cardiac output.  She is not volume overloaded on exam.  - Continue Entresto 49/51 bid with BMET today.  - Increase Coreg to 9.375 mg bid x 4 days then 12.5 mg bid after that.   - Continue spironolactone 25 mg daily.   - Continue dapagliflozin 10 mg daily.  - Narrow QRS so not CRT candidate.  - Repeat echo at followup appt to decide on ICD.  3. Carotid stenosis: Repeat carotid dopplers in 6/22.   4. Smoking: Strongly encouraged her to quit.  She has cut back a lot.  5. PCOM aneurysm: S/p embolization by IR in  12/20.   See HF pharmacist in 1 month for med titration then see me in 3 months with echo.   Loralie Champagne 11/14/2019

## 2019-12-09 ENCOUNTER — Other Ambulatory Visit: Payer: Self-pay

## 2019-12-09 ENCOUNTER — Telehealth (HOSPITAL_COMMUNITY): Payer: Self-pay | Admitting: *Deleted

## 2019-12-09 ENCOUNTER — Encounter (HOSPITAL_COMMUNITY): Payer: Self-pay

## 2019-12-09 DIAGNOSIS — R112 Nausea with vomiting, unspecified: Secondary | ICD-10-CM | POA: Insufficient documentation

## 2019-12-09 DIAGNOSIS — I5042 Chronic combined systolic (congestive) and diastolic (congestive) heart failure: Secondary | ICD-10-CM | POA: Insufficient documentation

## 2019-12-09 DIAGNOSIS — Z79899 Other long term (current) drug therapy: Secondary | ICD-10-CM | POA: Insufficient documentation

## 2019-12-09 DIAGNOSIS — Z20822 Contact with and (suspected) exposure to covid-19: Secondary | ICD-10-CM | POA: Insufficient documentation

## 2019-12-09 DIAGNOSIS — R55 Syncope and collapse: Principal | ICD-10-CM | POA: Insufficient documentation

## 2019-12-09 DIAGNOSIS — I110 Hypertensive heart disease with heart failure: Secondary | ICD-10-CM | POA: Diagnosis not present

## 2019-12-09 DIAGNOSIS — I255 Ischemic cardiomyopathy: Secondary | ICD-10-CM | POA: Insufficient documentation

## 2019-12-09 DIAGNOSIS — F17210 Nicotine dependence, cigarettes, uncomplicated: Secondary | ICD-10-CM | POA: Insufficient documentation

## 2019-12-09 DIAGNOSIS — I2510 Atherosclerotic heart disease of native coronary artery without angina pectoris: Secondary | ICD-10-CM | POA: Insufficient documentation

## 2019-12-09 DIAGNOSIS — R42 Dizziness and giddiness: Secondary | ICD-10-CM | POA: Diagnosis present

## 2019-12-09 DIAGNOSIS — I509 Heart failure, unspecified: Secondary | ICD-10-CM | POA: Diagnosis not present

## 2019-12-09 DIAGNOSIS — Z7982 Long term (current) use of aspirin: Secondary | ICD-10-CM | POA: Diagnosis not present

## 2019-12-09 DIAGNOSIS — J449 Chronic obstructive pulmonary disease, unspecified: Secondary | ICD-10-CM | POA: Insufficient documentation

## 2019-12-09 LAB — BASIC METABOLIC PANEL
Anion gap: 12 (ref 5–15)
BUN: 25 mg/dL — ABNORMAL HIGH (ref 6–20)
CO2: 21 mmol/L — ABNORMAL LOW (ref 22–32)
Calcium: 9.4 mg/dL (ref 8.9–10.3)
Chloride: 105 mmol/L (ref 98–111)
Creatinine, Ser: 1.16 mg/dL — ABNORMAL HIGH (ref 0.44–1.00)
GFR calc Af Amer: >60 mL/min (ref >60)
GFR calc non Af Amer: 52 mL/min — ABNORMAL LOW (ref >60)
Glucose, Bld: 96 mg/dL (ref 70–99)
Potassium: 4.2 mmol/L (ref 3.5–5.1)
Sodium: 138 mmol/L (ref 135–145)

## 2019-12-09 LAB — CBC
HCT: 33.5 % — ABNORMAL LOW (ref 36.0–46.0)
Hemoglobin: 10.8 g/dL — ABNORMAL LOW (ref 12.0–15.0)
MCH: 29.6 pg (ref 26.0–34.0)
MCHC: 32.2 g/dL (ref 30.0–36.0)
MCV: 91.8 fL (ref 80.0–100.0)
Platelets: 394 10*3/uL (ref 150–400)
RBC: 3.65 MIL/uL — ABNORMAL LOW (ref 3.87–5.11)
RDW: 16.2 % — ABNORMAL HIGH (ref 11.5–15.5)
WBC: 9.2 10*3/uL (ref 4.0–10.5)
nRBC: 0 % (ref 0.0–0.2)

## 2019-12-09 LAB — BRAIN NATRIURETIC PEPTIDE: B Natriuretic Peptide: 80 pg/mL (ref 0.0–100.0)

## 2019-12-09 LAB — TROPONIN I (HIGH SENSITIVITY): Troponin I (High Sensitivity): 5 ng/L (ref <18)

## 2019-12-09 NOTE — ED Triage Notes (Signed)
Pt complains of dizziness, lightheadedness, and fatigue since starting her new medication 2 weeks ago. States she tried to call her pcp but never got in touch with them.  NEW MEDS Carvedilol 12.5 dapagliflozin 10mg   intrestor increased to 50mg 

## 2019-12-09 NOTE — Telephone Encounter (Signed)
Pt left VM that she was having some issues and she thinks its due to her medications. I called pt back at [phone] as requested. No answer/left vm requesting pt return my call.

## 2019-12-10 ENCOUNTER — Emergency Department (HOSPITAL_COMMUNITY): Payer: PRIVATE HEALTH INSURANCE

## 2019-12-10 ENCOUNTER — Observation Stay (HOSPITAL_COMMUNITY)
Admission: EM | Admit: 2019-12-10 | Discharge: 2019-12-11 | Disposition: A | Discharge status: Home / Self Care | Payer: PRIVATE HEALTH INSURANCE | Attending: Family Medicine | Admitting: Family Medicine

## 2019-12-10 ENCOUNTER — Encounter (HOSPITAL_COMMUNITY): Payer: Self-pay | Admitting: Internal Medicine

## 2019-12-10 ENCOUNTER — Observation Stay (HOSPITAL_COMMUNITY): Payer: PRIVATE HEALTH INSURANCE

## 2019-12-10 DIAGNOSIS — I255 Ischemic cardiomyopathy: Secondary | ICD-10-CM | POA: Diagnosis not present

## 2019-12-10 DIAGNOSIS — I2510 Atherosclerotic heart disease of native coronary artery without angina pectoris: Secondary | ICD-10-CM | POA: Diagnosis present

## 2019-12-10 DIAGNOSIS — Z720 Tobacco use: Secondary | ICD-10-CM | POA: Diagnosis present

## 2019-12-10 DIAGNOSIS — I5042 Chronic combined systolic (congestive) and diastolic (congestive) heart failure: Secondary | ICD-10-CM | POA: Diagnosis present

## 2019-12-10 DIAGNOSIS — R55 Syncope and collapse: Secondary | ICD-10-CM | POA: Diagnosis present

## 2019-12-10 DIAGNOSIS — R42 Dizziness and giddiness: Secondary | ICD-10-CM

## 2019-12-10 DIAGNOSIS — I10 Essential (primary) hypertension: Secondary | ICD-10-CM | POA: Diagnosis not present

## 2019-12-10 LAB — HEPATIC FUNCTION PANEL
ALT: 20 U/L (ref 0–44)
AST: 26 U/L (ref 15–41)
Albumin: 3.7 g/dL (ref 3.5–5.0)
Alkaline Phosphatase: 56 U/L (ref 38–126)
Bilirubin, Direct: 0.1 mg/dL (ref 0.0–0.2)
Indirect Bilirubin: 0.8 mg/dL (ref 0.3–0.9)
Total Bilirubin: 0.9 mg/dL (ref 0.3–1.2)
Total Protein: 6.7 g/dL (ref 6.5–8.1)

## 2019-12-10 LAB — URINALYSIS, ROUTINE W REFLEX MICROSCOPIC
Bacteria, UA: NONE SEEN
Bilirubin Urine: NEGATIVE
Glucose, UA: >=500 mg/dL — AB
Hgb urine dipstick: NEGATIVE
Ketones, ur: 20 mg/dL — AB
Leukocytes,Ua: NEGATIVE
Nitrite: NEGATIVE
Protein, ur: NEGATIVE mg/dL
Specific Gravity, Urine: 1.022 (ref 1.005–1.030)
pH: 5 (ref 5.0–8.0)

## 2019-12-10 LAB — TROPONIN I (HIGH SENSITIVITY): Troponin I (High Sensitivity): 6 ng/L (ref <18)

## 2019-12-10 LAB — TSH: TSH: 0.674 u[IU]/mL (ref 0.350–4.500)

## 2019-12-10 LAB — HIV ANTIBODY (ROUTINE TESTING W REFLEX): HIV Screen 4th Generation wRfx: NONREACTIVE

## 2019-12-10 LAB — HEMOGLOBIN A1C
Hgb A1c MFr Bld: 5.9 % — ABNORMAL HIGH (ref 4.8–5.6)
Mean Plasma Glucose: 122.63 mg/dL

## 2019-12-10 LAB — T4, FREE: Free T4: 1.06 ng/dL (ref 0.61–1.12)

## 2019-12-10 LAB — FOLATE: Folate: 67.2 ng/mL (ref >5.9)

## 2019-12-10 LAB — VITAMIN B12: Vitamin B-12: 257 pg/mL (ref 180–914)

## 2019-12-10 LAB — SARS CORONAVIRUS 2 BY RT PCR (HOSPITAL ORDER, PERFORMED IN ~~LOC~~ HOSPITAL LAB): SARS Coronavirus 2: NEGATIVE

## 2019-12-10 MED ORDER — CARVEDILOL 12.5 MG PO TABS
6.2500 mg | ORAL_TABLET | Freq: Two times a day (BID) | ORAL | Status: DC
  Filled 2019-12-10: qty 1

## 2019-12-10 MED ORDER — PANTOPRAZOLE SODIUM 40 MG IV SOLR
40.0000 mg | Freq: Two times a day (BID) | INTRAVENOUS | Status: DC
  Administered 2019-12-11: 40 mg via INTRAVENOUS
  Filled 2019-12-10 (×2): qty 40

## 2019-12-10 MED ORDER — ADULT MULTIVITAMIN W/MINERALS CH
1.0000 | ORAL_TABLET | Freq: Every day | ORAL | Status: DC
  Administered 2019-12-11: 1 via ORAL
  Filled 2019-12-10: qty 1

## 2019-12-10 MED ORDER — ONDANSETRON HCL 4 MG PO TABS: 4.0000 mg | ORAL_TABLET | Freq: Four times a day (QID) | ORAL | Status: DC | PRN

## 2019-12-10 MED ORDER — ALPRAZOLAM 0.5 MG PO TABS: 1.0000 mg | ORAL_TABLET | Freq: Every evening | ORAL | Status: DC | PRN

## 2019-12-10 MED ORDER — ONDANSETRON HCL 4 MG/2ML IJ SOLN
4.0000 mg | Freq: Once | INTRAMUSCULAR | Status: AC
  Administered 2019-12-10: 4 mg via INTRAVENOUS
  Filled 2019-12-10: qty 2

## 2019-12-10 MED ORDER — SACUBITRIL-VALSARTAN 49-51 MG PO TABS
1.0000 | ORAL_TABLET | Freq: Two times a day (BID) | ORAL | Status: DC
  Filled 2019-12-10 (×6): qty 1

## 2019-12-10 MED ORDER — ONDANSETRON HCL 4 MG/2ML IJ SOLN: 4.0000 mg | Freq: Four times a day (QID) | INTRAMUSCULAR | Status: DC | PRN

## 2019-12-10 MED ORDER — NICOTINE 21 MG/24HR TD PT24
21.0000 mg | MEDICATED_PATCH | Freq: Every day | TRANSDERMAL | Status: DC
  Administered 2019-12-11: 21 mg via TRANSDERMAL
  Filled 2019-12-10: qty 1

## 2019-12-10 MED ORDER — GABAPENTIN 100 MG PO CAPS
100.0000 mg | ORAL_CAPSULE | Freq: Two times a day (BID) | ORAL | Status: DC
  Administered 2019-12-11: 100 mg via ORAL
  Filled 2019-12-10 (×2): qty 1

## 2019-12-10 MED ORDER — SODIUM CHLORIDE 0.9 % IV BOLUS
500.0000 mL | Freq: Once | INTRAVENOUS | Status: AC
  Administered 2019-12-10: 500 mL via INTRAVENOUS

## 2019-12-10 MED ORDER — ROPINIROLE HCL 0.25 MG PO TABS
0.5000 mg | ORAL_TABLET | Freq: Every day | ORAL | Status: DC
  Filled 2019-12-10 (×2): qty 2
  Filled 2019-12-10: qty 1

## 2019-12-10 MED ORDER — LORAZEPAM 1 MG PO TABS: 1.0000 mg | ORAL_TABLET | Freq: Every day | ORAL | Status: DC | PRN

## 2019-12-10 MED ORDER — IOHEXOL 350 MG/ML SOLN
75.0000 mL | Freq: Once | INTRAVENOUS | Status: AC | PRN
  Administered 2019-12-10: 75 mL via INTRAVENOUS

## 2019-12-10 MED ORDER — ACETAMINOPHEN 325 MG PO TABS
650.0000 mg | ORAL_TABLET | Freq: Four times a day (QID) | ORAL | Status: DC | PRN
  Administered 2019-12-10: 650 mg via ORAL
  Filled 2019-12-10: qty 2

## 2019-12-10 MED ORDER — ENOXAPARIN SODIUM 40 MG/0.4ML ~~LOC~~ SOLN
40.0000 mg | SUBCUTANEOUS | Status: DC
  Administered 2019-12-10: 40 mg via SUBCUTANEOUS
  Filled 2019-12-10: qty 0.4

## 2019-12-10 MED ORDER — ACETAMINOPHEN 650 MG RE SUPP: 650.0000 mg | Freq: Four times a day (QID) | RECTAL | Status: DC | PRN

## 2019-12-10 MED ORDER — EZETIMIBE 10 MG PO TABS
10.0000 mg | ORAL_TABLET | Freq: Every day | ORAL | Status: DC
  Administered 2019-12-11: 10 mg via ORAL
  Filled 2019-12-10 (×3): qty 1

## 2019-12-10 MED ORDER — ATORVASTATIN CALCIUM 40 MG PO TABS
80.0000 mg | ORAL_TABLET | Freq: Every day | ORAL | Status: DC
  Administered 2019-12-11: 80 mg via ORAL
  Filled 2019-12-10: qty 2

## 2019-12-10 MED ORDER — SPIRONOLACTONE 25 MG PO TABS
25.0000 mg | ORAL_TABLET | Freq: Every day | ORAL | Status: DC
  Administered 2019-12-11: 25 mg via ORAL
  Filled 2019-12-10 (×3): qty 1

## 2019-12-10 MED ORDER — ASPIRIN EC 81 MG PO TBEC
81.0000 mg | DELAYED_RELEASE_TABLET | Freq: Every evening | ORAL | Status: DC
  Administered 2019-12-10: 81 mg via ORAL
  Filled 2019-12-10: qty 1

## 2019-12-10 MED ORDER — MECLIZINE HCL 12.5 MG PO TABS
25.0000 mg | ORAL_TABLET | Freq: Once | ORAL | Status: AC
  Administered 2019-12-10: 25 mg via ORAL
  Filled 2019-12-10: qty 2

## 2019-12-10 MED ORDER — ZINC GLUCONATE 50 MG PO TABS: 50.0000 mg | ORAL_TABLET | Freq: Every day | ORAL | Status: DC

## 2019-12-10 MED ORDER — OMEGA-3-ACID ETHYL ESTERS 1 G PO CAPS
2.0000 g | ORAL_CAPSULE | Freq: Two times a day (BID) | ORAL | Status: DC
  Administered 2019-12-11: 2 g via ORAL
  Filled 2019-12-10 (×6): qty 2

## 2019-12-10 MED ORDER — CLOPIDOGREL BISULFATE 75 MG PO TABS
75.0000 mg | ORAL_TABLET | Freq: Every day | ORAL | Status: DC
  Administered 2019-12-11: 75 mg via ORAL
  Filled 2019-12-10: qty 1

## 2019-12-10 NOTE — H&P (Signed)
History and Physical  [name] BJY:[id number] DOB: [birth year] DOA: 12/10/2019   PCP: [name], MD   Patient coming from: Home  Chief Complaint: dizziness  HPI:  Erin [name] is a 59 y.o. female with medical history of systolic and diastolic CHF, coronary artery disease with history of MI November 2017 with DEX to LCx and DES x 2 to RCA, COPD and continued tobacco abuse, hyperlipidemia, hypertension, GERD presenting with 3-week history of intermittent dizziness and lightheadedness with near syncope.  Patient states that these episodes usually occur approximately 30 to 45 minutes after taking her cardiac medications and usually lasts 1 to 2 hours.  She denies any falls or syncope.  Notably, the patient saw her cardiologist, Dr. Marigene Ehlers on 11/12/2019.  At that time, patient's carvedilol was increased to 12.5 mg twice daily.  She also states that she was started on dapagliflozin 10 mg daily and vascepa.  Since this time, the patient feels she has been walking on a mobile and sometimes feels like she has tunnel vision.  She denies any frank focal extremity weakness or visual loss in her visual fields.  She denies any fevers, chills, chest pain, worsening shortness of breath, abdominal pain, medication, melena.  However, patient states that she has had some intermittent nausea and vomiting for the past 2 to 3 weeks.  Is also had chronic loose stools having 3-5 loose bowel movements on a daily basis.  She has not had any hematochezia or melena.  She is not having fevers or chills or recent travel or sick contacts.  There is no dysuria or hematuria.  In the emergency department, patient was afebrile hemodynamically stable with oxygen saturation 100% room air.  BMP was unremarkable.  Her 0.7-0.9.  CBC was unremarkable.  UA was negative for pyuria.  CT angiogram head and neck showed no emergent vascular findings.  There was 60% atheromatous left ICA bulb narrowing at 55% proximal right subclavian  stenosis.  Patient was admitted for further evaluation and treatment.  Assessment/Plan: Near syncope/dizziness -May be related to recent increasing doses of narcotic medications as well as a degree of volume depletion given her intermittent vomiting and loose stools -Orthostatic vital signs negative -Cardiology consult -MRI brain -her serum creatinine is mildly elevated above usual baseline of 0.7 to 0.9 -UA negative for pyuria -Serum B12 -TSH -monitor of telemetry  Intermittent nausea vomiting -Check LFTs -Start PPI twice daily -Hold off on IV fluids for right now ending cardiology evaluation  Diarrhea -Present prior to admission -Stool pathogen panel -Check C. Difficile  Chronic systolic and diastolic CHF -0/86/5784 echo EF 30-35%, grade 1 DD -Patient appears a bit on the dry side -Restart carvedilol at lower dose -Continue Entresto -Continue spironolactone  Coronary artery disease status post NSTEMI -No chest pain presently -Left heart catheterization April 2009 showed nonobstructive mild CAD -continue ASA+Plavix  Tobacco abuse -cessation discussed  PCOM aneurysm:  -S/p embolization by IR in 12/20.        Past Medical History:  Diagnosis Date  . Anxiety state 03/25/2016  . Basal cell carcinoma of forehead   . CHF (congestive heart failure) ([place])   . Coronary artery disease    a. 03/11/16 PCI with DES-->Prox/Mid Cx;  b. 03/14/16 PCI with DES x2-->RCA, EF 30-35%.  . Essential hypertension   . GERD (gastroesophageal reflux disease)   . HFrEF (heart failure with reduced ejection fraction) ([place])    a. 10/2016 Echo: EF 35-40%, Gr1 DD, mild focal  basal septal hypertrophy, basal inflat, mid inflat, basal antlat AK. Mid infept/inf/antlat, apical lateral sev HK. Mod MR. mildly reduced RV fxn. Mild TR.  [name] Kitchen History of pneumonia   . Hyperlipidemia   . IBS (irritable bowel syndrome)    diarrhea  . Ischemic cardiomyopathy    a. 10/2016 Echo: EF 35-40%, Gr1 DD.  [name] Kitchen Mitral  regurgitation   . NSTEMI (non-ST elevated myocardial infarction) ([place]) 03/10/2016  . Pneumonia 03/2016  . Squamous cell cancer of skin of nose   . Thrombocytosis ([place]) 03/26/2016  . Tobacco abuse   . Trichimoniasis   . Wears dentures   . Wears glasses    Past Surgical History:  Procedure Laterality Date  . APPENDECTOMY    . BIOPSY  09/20/2018   Procedure: BIOPSY;  Surgeon: Daneil Dolin, MD;  [facility];  Service: Endoscopy;;  colon  . CARDIAC CATHETERIZATION N/A 03/11/2016   Procedure: Left Heart Cath and Coronary Angiography;  Surgeon: Leonie Man, MD;  Location: [place] [facility];  Service: Cardiovascular;  Laterality: N/A;  . CARDIAC CATHETERIZATION N/A 03/11/2016   Procedure: Coronary Stent Intervention;  Surgeon: Leonie Man, MD;  Location: [place] [facility];  Service: Cardiovascular;  Laterality: N/A;  . CARDIAC CATHETERIZATION N/A 03/14/2016   Procedure: Coronary Stent Intervention;  Surgeon: Peter M Martinique, MD;  Location: [place] [facility];  Service: Cardiovascular;  Laterality: N/A;  . CHOLECYSTECTOMY OPEN  1984  . COLONOSCOPY WITH PROPOFOL N/A 09/20/2018   Procedure: COLONOSCOPY WITH PROPOFOL;  Surgeon: Daneil Dolin, MD;  [facility];  Service: Endoscopy;  Laterality: N/A;  10:30am  . CORONARY ANGIOPLASTY WITH STENT PLACEMENT  03/14/2016  . ESOPHAGOGASTRODUODENOSCOPY (EGD) WITH PROPOFOL N/A 09/20/2018   Procedure: ESOPHAGOGASTRODUODENOSCOPY (EGD) WITH PROPOFOL;  Surgeon: Daneil Dolin, MD;  [facility];  Service: Endoscopy;  Laterality: N/A;  . FINGER ARTHROPLASTY Left 05/14/2013   Procedure: LEFT THUMB CARPAL METACARPAL ARTHROPLASTY;  Surgeon: Tennis Must, MD;  Location: [facility];  Service: Orthopedics;  Laterality: Left;  . IR ANGIO INTRA EXTRACRAN SEL COM CAROTID INNOMINATE BILAT MOD SED  01/05/2017  . IR ANGIO INTRA EXTRACRAN SEL COM CAROTID INNOMINATE BILAT MOD SED  03/19/2019  . IR ANGIO INTRA EXTRACRAN  SEL INTERNAL CAROTID UNI L MOD SED  04/15/2019  . IR ANGIO VERTEBRAL SEL VERTEBRAL BILAT MOD SED  01/05/2017  . IR ANGIO VERTEBRAL SEL VERTEBRAL BILAT MOD SED  03/19/2019  . IR ANGIOGRAM FOLLOW UP STUDY  04/15/2019  . IR RADIOLOGIST EVAL & MGMT  12/30/2016  . IR TRANSCATH/EMBOLIZ  04/15/2019  . IR US GUIDE VASC ACCESS RIGHT  03/19/2019  . MALONEY DILATION N/A 09/20/2018   Procedure: Venia Minks DILATION;  Surgeon: Daneil Dolin, MD;  [facility];  Service: Endoscopy;  Laterality: N/A;  . RADIOLOGY WITH ANESTHESIA N/A 04/15/2019   Procedure: [employer];  Surgeon: Luanne Bras, MD;  Location: [place];  Service: Radiology;  Laterality: N/A;  . RIGHT/LEFT HEART CATH AND CORONARY ANGIOGRAPHY N/A 08/19/2019   Procedure: RIGHT/LEFT HEART CATH AND CORONARY ANGIOGRAPHY;  Surgeon: Larey Dresser, MD;  Location: [place] [facility];  Service: Cardiovascular;  Laterality: N/A;  . TUBAL LIGATION  1987  . VAGINAL HYSTERECTOMY  2009   Social History:  reports that she has been smoking cigarettes. She has a 1.50 pack-year smoking history. She has never used smokeless tobacco. She reports previous alcohol use. She reports previous drug use. Drug: Marijuana.  Family History  Problem Relation Age of Onset  . Diabetes Father   . Hypertension Father   . CAD Father   . Colon polyps Father 60       pre-cancerous   . Stroke Father   . Dementia Father   . Stroke Mother   . Hypertension Mother   . Diabetes Mother   . Heart failure Other   . Breast cancer Maternal Grandmother   . Colon cancer Neg Hx      Allergies  Allergen Reactions  . Tape Other (See Comments)    PEELS SKIN OFF  (PAPER TAPE IS FINE)     Prior to Admission medications   Medication Sig Start Date End Date Taking? Authorizing Provider  acetaminophen (TYLENOL) 325 MG tablet Take 325 mg by mouth every 6 (six) hours as needed for mild pain or headache.     [provider]  ALPRAZolam Duanne Moron) 1 MG tablet Take 1-2 mg  by mouth at bedtime as needed for anxiety.     [provider]  aspirin EC 81 MG tablet Take 81 mg by mouth every evening.     [provider]  atorvastatin (LIPITOR) 80 MG tablet TAKE 1 TABLET BY MOUTH ONCE DAILY AT  6  IN  THE  EVENING 08/05/19   Strader, [place] M, PA-C  carvedilol (COREG) 12.5 MG tablet Take 1 tablet (12.5 mg total) by mouth 2 (two) times daily. 11/12/19   Larey Dresser, MD  clopidogrel (PLAVIX) 75 MG tablet Take 1 tablet by mouth once daily 10/08/19   Herminio Commons, MD  dapagliflozin propanediol (FARXIGA) 10 MG TABS tablet Take 10 mg by mouth daily before breakfast. 09/26/19   Larey Dresser, MD  ezetimibe (ZETIA) 10 MG tablet Take 1 tablet by mouth once daily 05/08/19   Herminio Commons, MD  gabapentin (NEURONTIN) 100 MG capsule Take 100 mg by mouth 2 (two) times daily.     [provider]  icosapent Ethyl ([name]) 1 g capsule Take 2 capsules (2 g total) by mouth 2 (two) times daily. 09/12/19   Larey Dresser, MD  loperamide (IMODIUM) 2 MG capsule Take 2 mg by mouth 4 (four) times daily as needed for diarrhea or loose stools (ibs).     [provider]  LORazepam (ATIVAN) 1 MG tablet Take 1 mg by mouth daily as needed for anxiety.  11/09/17   [provider]  Multiple Vitamins-Minerals (MULTIVITAMIN WITH MINERALS) tablet Take 1 tablet by mouth daily.    [provider]  nicotine (NICODERM CQ - DOSED IN MG/24 HOURS) 21 mg/24hr patch Place 21 mg onto the skin daily.    [provider]  nitroGLYCERIN (NITROSTAT) 0.4 MG SL tablet Place 1 tablet (0.4 mg total) under the tongue every 5 (five) minutes x 3 doses as needed for chest pain. Patient not taking: Reported on 11/12/2019 03/05/18   Heath Lark D, DO  rOPINIRole (REQUIP) 0.5 MG tablet Take 0.5 mg by mouth at bedtime.  11/13/17   [provider]  sacubitril-valsartan (ENTRESTO) 49-51 MG Take 1 tablet by mouth 2 (two) times daily. 09/03/19   Larey Dresser, MD  spironolactone (ALDACTONE) 25 MG tablet Take 25 mg by mouth daily.    [provider]  zinc gluconate 50 MG tablet Take 50 mg by mouth daily.    [provider]    Review of Systems:  Constitutional:  No weight loss, night sweats, Fevers, chills, fatigue.  Head&Eyes:  No headache.  No vision loss.  No eye pain or scotoma ENT:  No Difficulty swallowing,Tooth/dental problems,Sore throat,  No ear ache, post nasal drip,  Cardio-vascular:  No chest pain, Orthopnea, PND, swelling in lower extremities,  dizziness, palpitations  GI:  No  abdominal pain, nausea, vomiting, diarrhea, loss of appetite, hematochezia, melena, heartburn, indigestion, Resp:  No shortness of breath with exertion or at rest. No cough. No coughing up of blood .No wheezing.No chest wall deformity  Skin:  no rash or lesions.  GU:  no dysuria, change in color of urine, no urgency or frequency. No flank pain.  Musculoskeletal:  No joint pain or swelling. No decreased range of motion. No back pain.  Psych:  No change in mood or affect. No depression or anxiety. Neurologic: No headache, no dysesthesia, no focal weakness, no vision loss. No syncope  Physical Exam: Vitals:   12/09/19 2028 12/10/19 0154 12/10/19 0627  BP:  107/68 112/76  Pulse:  80 71  Resp:  17 18  Temp:  97.8 F (36.6 C) 98.1 F (36.7 C)  TempSrc:   Oral  SpO2:  99% 100%  Weight: 59 kg    Height: 5' 0.5" (1.537 m)     General:  A&O x 3, NAD, nontoxic, pleasant/cooperative Head/Eye: No conjunctival hemorrhage, no icterus, [place]/AT, No nystagmus ENT:  No icterus,  No thrush, good dentition, no pharyngeal exudate Neck:  No masses, no lymphadenpathy, no bruits CV:  RRR, no rub, no gallop, no S3 Lung:  CTAB, good air movement, no wheeze, no rhonchi Abdomen: soft/NT, +BS, nondistended, no peritoneal signs Ext: No cyanosis, No rashes, No petechiae, No lymphangitis, No edema Neuro: CNII-XII intact, strength 4/5 in  bilateral upper and lower extremities, no dysmetria  Labs on Admission:  Basic Metabolic Panel: Recent Labs  Lab 12/09/19 2105  NA 138  K 4.2  CL 105  CO2 21*  GLUCOSE 96  BUN 25*  CREATININE 1.16*  CALCIUM 9.4   Liver Function Tests: No results for input(s): AST, ALT, ALKPHOS, BILITOT, PROT, ALBUMIN in the last 168 hours. No results for input(s): LIPASE, AMYLASE in the last 168 hours. No results for input(s): AMMONIA in the last 168 hours. CBC: Recent Labs  Lab 12/09/19 2105  WBC 9.2  HGB 10.8*  HCT 33.5*  MCV 91.8  PLT 394   Coagulation Profile: No results for input(s): INR, PROTIME in the last 168 hours. Cardiac Enzymes: No results for input(s): CKTOTAL, CKMB, CKMBINDEX, TROPONINI in the last 168 hours. BNP: Invalid input(s): POCBNP CBG: No results for input(s): GLUCAP in the last 168 hours. Urine analysis:    Component Value Date/Time   COLORURINE YELLOW 12/10/2019 0210   APPEARANCEUR CLEAR 12/10/2019 0210   LABSPEC 1.022 12/10/2019 0210   PHURINE 5.0 12/10/2019 0210   GLUCOSEU >=500 (A) 12/10/2019 0210   HGBUR NEGATIVE 12/10/2019 0210   BILIRUBINUR NEGATIVE 12/10/2019 0210   KETONESUR 20 (A) 12/10/2019 0210   PROTEINUR NEGATIVE 12/10/2019 0210   NITRITE NEGATIVE 12/10/2019 0210   LEUKOCYTESUR NEGATIVE 12/10/2019 0210   Sepsis Labs: @LABRCNTIP (procalcitonin:4,lacticidven:4) ) Recent Results (from the past 240 hour(s))  SARS Coronavirus 2 by RT PCR (hospital order, performed in [place] hospital lab) Nasopharyngeal Nasopharyngeal Swab     Status: None   Collection Time: 12/10/19  6:02 AM   Specimen: Nasopharyngeal Swab  Result Value Ref Range Status   SARS Coronavirus 2 NEGATIVE NEGATIVE Final    Comment: (NOTE) SARS-CoV-2 target nucleic acids are NOT DETECTED.  The SARS-CoV-2  RNA is generally detectable in upper and lower respiratory specimens during the acute phase of infection. The lowest concentration of SARS-CoV-2 viral copies this assay  can detect is 250 copies / mL. A negative result does not preclude SARS-CoV-2 infection and should not be used as the sole basis for treatment or other patient management decisions.  A negative result may occur with improper specimen collection / handling, submission of specimen other than nasopharyngeal swab, presence of viral mutation(s) within the areas targeted by this assay, and inadequate number of viral copies (<250 copies / mL). A negative result must be combined with clinical observations, patient history, and epidemiological information.  Fact Sheet for Patients:   StrictlyIdeas.no  Fact Sheet for Healthcare Providers: BankingDealers.co.za  This test is not yet approved or  cleared by the [place] FDA and has been authorized for detection and/or diagnosis of SARS-CoV-2 by FDA under an Emergency Use Authorization (EUA).  This EUA will remain in effect (meaning this test can be used) for the duration of the COVID-19 declaration under Section 564(b)(1) of the Act, 21 U.S.C. section 360bbb-3(b)(1), unless the authorization is terminated or revoked sooner.  Performed at [facility], [address] [place],  [address]      Radiological Exams on Admission: CT Angio Head W or Wo Contrast  Result Date: 12/10/2019 CLINICAL DATA:  Persistent or recurrent dizziness. EXAM: CT ANGIOGRAPHY HEAD AND NECK TECHNIQUE: Multidetector CT imaging of the head and neck was performed using the standard protocol during bolus administration of intravenous contrast. Multiplanar CT image reconstructions and MIPs were obtained to evaluate the vascular anatomy. Carotid stenosis measurements (when applicable) are obtained utilizing NASCET criteria, using the distal internal carotid diameter as the denominator. CONTRAST:  25mL OMNIPAQUE IOHEXOL 350 MG/ML SOLN COMPARISON:  Brain MRI and intracranial MRA 06/25/2019 FINDINGS: CT HEAD FINDINGS Brain: Small  remote left parietal cortex infarct. Apparent low-density in the left medial thalamus on axial slices is not confirmed on the reformats or thinner postcontrast imaging No hemorrhage, hydrocephalus, or atrophy. Vascular: No hyperdense vessel or unexpected calcification. Skull: Normal. Negative for fracture or focal lesion. Sinuses: Imaged portions are clear. CTA NECK FINDINGS Aortic arch: Negative for aneurysm or dissection. Atheromatous wall thickening. Two vessel branching. Right carotid system: Atheromatous plaque along the brachiocephalic without flow limiting stenosis. Moderate mixed density plaque at the ICA bulb with narrowing at the ICA origin measuring less than 50%. No downstream beading. Left carotid system: Mild plaque at the common carotid origin. Mixed density plaque at the bifurcation and ICA bulb with 60% narrowing based on coronal reformats. No ulceration or beading. Vertebral arteries: Proximal right subclavian artery stenosis with 55% stenosis based on axial images. The left vertebral artery is dominant. Both vertebral arteries are widely patent to the dura. Skeleton: Degenerative facet spurring in the lower cervical spine. Other neck: No acute finding Upper chest: Negative Review of the MIP images confirms the above findings CTA HEAD FINDINGS Anterior circulation: Atheromatous plaque along the carotid siphons. Left paraclinoid to supraclinoid ICA stent without evidence of stenosis. No aneurysm filling. No branch occlusion or beading. Posterior circulation: Vertebral and basilar arteries are smooth and widely patent. No major PCA branch occlusion or flow limiting stenosis. Negative for aneurysm. Venous sinuses: Diffusely patent Anatomic variants: None significant Review of the MIP images confirms the above findings IMPRESSION: Head CT: Equivocal for interval infarct in the left medial thalamus. No visible cerebellar infarction. CTA: 1. No emergent vascular finding. 2. 60% atheromatous narrowing at  the  left ICA bulb. 3. Left intracranial ICA stent without residual aneurysm filling or visible in-stent stenosis. 4. 55% proximal right subclavian stenosis. 5. No evidence of vertebral or basilar stenosis. Electronically Signed   By: Monte Fantasia M.D.   On: 12/10/2019 05:18   CT Angio Neck W and/or Wo Contrast  Result Date: 12/10/2019 CLINICAL DATA:  Persistent or recurrent dizziness. EXAM: CT ANGIOGRAPHY HEAD AND NECK TECHNIQUE: Multidetector CT imaging of the head and neck was performed using the standard protocol during bolus administration of intravenous contrast. Multiplanar CT image reconstructions and MIPs were obtained to evaluate the vascular anatomy. Carotid stenosis measurements (when applicable) are obtained utilizing NASCET criteria, using the distal internal carotid diameter as the denominator. CONTRAST:  3mL OMNIPAQUE IOHEXOL 350 MG/ML SOLN COMPARISON:  Brain MRI and intracranial MRA 06/25/2019 FINDINGS: CT HEAD FINDINGS Brain: Small remote left parietal cortex infarct. Apparent low-density in the left medial thalamus on axial slices is not confirmed on the reformats or thinner postcontrast imaging No hemorrhage, hydrocephalus, or atrophy. Vascular: No hyperdense vessel or unexpected calcification. Skull: Normal. Negative for fracture or focal lesion. Sinuses: Imaged portions are clear. CTA NECK FINDINGS Aortic arch: Negative for aneurysm or dissection. Atheromatous wall thickening. Two vessel branching. Right carotid system: Atheromatous plaque along the brachiocephalic without flow limiting stenosis. Moderate mixed density plaque at the ICA bulb with narrowing at the ICA origin measuring less than 50%. No downstream beading. Left carotid system: Mild plaque at the common carotid origin. Mixed density plaque at the bifurcation and ICA bulb with 60% narrowing based on coronal reformats. No ulceration or beading. Vertebral arteries: Proximal right subclavian artery stenosis with 55% stenosis  based on axial images. The left vertebral artery is dominant. Both vertebral arteries are widely patent to the dura. Skeleton: Degenerative facet spurring in the lower cervical spine. Other neck: No acute finding Upper chest: Negative Review of the MIP images confirms the above findings CTA HEAD FINDINGS Anterior circulation: Atheromatous plaque along the carotid siphons. Left paraclinoid to supraclinoid ICA stent without evidence of stenosis. No aneurysm filling. No branch occlusion or beading. Posterior circulation: Vertebral and basilar arteries are smooth and widely patent. No major PCA branch occlusion or flow limiting stenosis. Negative for aneurysm. Venous sinuses: Diffusely patent Anatomic variants: None significant Review of the MIP images confirms the above findings IMPRESSION: Head CT: Equivocal for interval infarct in the left medial thalamus. No visible cerebellar infarction. CTA: 1. No emergent vascular finding. 2. 60% atheromatous narrowing at the left ICA bulb. 3. Left intracranial ICA stent without residual aneurysm filling or visible in-stent stenosis. 4. 55% proximal right subclavian stenosis. 5. No evidence of vertebral or basilar stenosis. Electronically Signed   By: Monte Fantasia M.D.   On: 12/10/2019 05:18    EKG: Independently reviewed. Sinus, nonspecific T wave changes    Time spent:60 minutes Code Status:   FULL Family Communication:  No Family at bedside Disposition Plan: expect 1-2 day hospitalization Consults called: cardiology DVT Prophylaxis:   Orson Eva, DO  [facility] Pager [phone]  If 7PM-7AM, please contact night-coverage [email or web address] Password [facility] 12/10/2019, 7:55 AM

## 2019-12-10 NOTE — Progress Notes (Signed)
Called about patients low blood pressure. Systolic 73Z, will give bolus of 517mls. Appears a component of her near-syncope/diziiness may be volume depletion, medications. I do not see any fluids adminstered. Recent Ef 30 - 35%.   Arlyce Dice, MD. [facility].

## 2019-12-10 NOTE — Consult Note (Signed)
Cardiology Consultation:   Patient ID: Erin [name]; [id number]; 1961-02-16   Admit date: 12/10/2019 Date of Consult: 12/10/2019  Primary Care Provider: [name], MD Primary Cardiologist: Loralie Champagne, MD Primary Electrophysiologist: None   Patient Profile:   Erin [name] is a 59 y.o. female with a history of ischemic cardiomyopathy with LVEF 30 to 35%, the status post DES to the proximal and mid circumflex and staged DES x2 to the RCA 2017 PAD, left posterior communicating artery aneurysm status post embolization, carotid artery disease, and tobacco use who is being seen today for the evaluation of dizziness at the request of Dr. Carles Collet.  History of Present Illness:   Erin [name] presents to the [name] ER complaining of recurring lightheadedness and near syncope.  She states that after she takes her morning medications she feels lightheaded despite eating thereafter.  At work she has had the facility nurse check her blood pressure, reports systolics generally in the nineties during this time.  As the day goes on symptoms do get better.  She has had a number of medication adjustments over the last few months including doubling of her Entresto dose, doubling of her Coreg dose, and also initiation of [place].  She follows closely in the heart failure clinic.  On evaluation in the ER she was not found found to be frankly orthostatic today.  She is in sinus rhythm by telemetry.  She is being evaluated by brain MRI as well per primary team, results pending.  Lab work shows mild bump in creatinine to 1.16, hemoglobin 10.8.  She has no evidence of fluid overload on examination.  Past Medical History:  Diagnosis Date  . Anxiety state 03/25/2016  . Basal cell carcinoma of forehead   . CHF (congestive heart failure) ([place])   . Coronary artery disease    a. 03/11/16 PCI with DES-->Prox/Mid Cx;  b. 03/14/16 PCI with DES x2-->RCA, EF 30-35%.  . Essential hypertension   . GERD (gastroesophageal  reflux disease)   . HFrEF (heart failure with reduced ejection fraction) ([place])    a. 10/2016 Echo: EF 35-40%, Gr1 DD, mild focal basal septal hypertrophy, basal inflat, mid inflat, basal antlat AK. Mid infept/inf/antlat, apical lateral sev HK. Mod MR. mildly reduced RV fxn. Mild TR.  [name] Kitchen History of pneumonia   . Hyperlipidemia   . IBS (irritable bowel syndrome)   . Ischemic cardiomyopathy    a. 10/2016 Echo: EF 35-40%, Gr1 DD.  [name] Kitchen Mitral regurgitation   . NSTEMI (non-ST elevated myocardial infarction) ([name]) 03/10/2016  . Pneumonia 03/2016  . Squamous cell cancer of skin of nose   . Thrombocytosis ([place]) 03/26/2016  . Tobacco abuse   . Trichimoniasis   . Wears dentures   . Wears glasses     Past Surgical History:  Procedure Laterality Date  . APPENDECTOMY    . BIOPSY  09/20/2018   Procedure: BIOPSY;  Surgeon: Daneil Dolin, MD;  [facility];  Service: Endoscopy;;  colon  . CARDIAC CATHETERIZATION N/A 03/11/2016   Procedure: Left Heart Cath and Coronary Angiography;  Surgeon: Leonie Man, MD;  Location: [place] [facility];  Service: Cardiovascular;  Laterality: N/A;  . CARDIAC CATHETERIZATION N/A 03/11/2016   Procedure: Coronary Stent Intervention;  Surgeon: Leonie Man, MD;  Location: [place] [facility];  Service: Cardiovascular;  Laterality: N/A;  . CARDIAC CATHETERIZATION N/A 03/14/2016   Procedure: Coronary Stent Intervention;  Surgeon: Peter M Martinique, MD;  Location: [place] [facility];  Service: Cardiovascular;  Laterality: N/A;  . CHOLECYSTECTOMY OPEN  1984  . COLONOSCOPY WITH PROPOFOL N/A 09/20/2018   Procedure: COLONOSCOPY WITH PROPOFOL;  Surgeon: Daneil Dolin, MD;  [facility];  Service: Endoscopy;  Laterality: N/A;  10:30am  . CORONARY ANGIOPLASTY WITH STENT PLACEMENT  03/14/2016  . ESOPHAGOGASTRODUODENOSCOPY (EGD) WITH PROPOFOL N/A 09/20/2018   Procedure: ESOPHAGOGASTRODUODENOSCOPY (EGD) WITH PROPOFOL;  Surgeon: Daneil Dolin, MD;  [facility];  Service: Endoscopy;  Laterality: N/A;  . FINGER ARTHROPLASTY Left 05/14/2013   Procedure: LEFT THUMB CARPAL METACARPAL ARTHROPLASTY;  Surgeon: Tennis Must, MD;  Location: [facility];  Service: Orthopedics;  Laterality: Left;  . IR ANGIO INTRA EXTRACRAN SEL COM CAROTID INNOMINATE BILAT MOD SED  01/05/2017  . IR ANGIO INTRA EXTRACRAN SEL COM CAROTID INNOMINATE BILAT MOD SED  03/19/2019  . IR ANGIO INTRA EXTRACRAN SEL INTERNAL CAROTID UNI L MOD SED  04/15/2019  . IR ANGIO VERTEBRAL SEL VERTEBRAL BILAT MOD SED  01/05/2017  . IR ANGIO VERTEBRAL SEL VERTEBRAL BILAT MOD SED  03/19/2019  . IR ANGIOGRAM FOLLOW UP STUDY  04/15/2019  . IR RADIOLOGIST EVAL & MGMT  12/30/2016  . IR TRANSCATH/EMBOLIZ  04/15/2019  . IR US GUIDE VASC ACCESS RIGHT  03/19/2019  . MALONEY DILATION N/A 09/20/2018   Procedure: Venia Minks DILATION;  Surgeon: Daneil Dolin, MD;  [facility];  Service: Endoscopy;  Laterality: N/A;  . RADIOLOGY WITH ANESTHESIA N/A 04/15/2019   Procedure: [employer];  Surgeon: Luanne Bras, MD;  Location: [facility];  Service: Radiology;  Laterality: N/A;  . RIGHT/LEFT HEART CATH AND CORONARY ANGIOGRAPHY N/A 08/19/2019   Procedure: RIGHT/LEFT HEART CATH AND CORONARY ANGIOGRAPHY;  Surgeon: Larey Dresser, MD;  Location: [facility];  Service: Cardiovascular;  Laterality: N/A;  . TUBAL LIGATION  1987  . VAGINAL HYSTERECTOMY  2009     Outpatient Medications: No current facility-administered medications on file prior to encounter.   Current Outpatient Medications on File Prior to Encounter  Medication Sig Dispense Refill  . ALPRAZolam (XANAX) 1 MG tablet Take 1-2 mg by mouth at bedtime.     [name] Kitchen aspirin EC 81 MG tablet Take 81 mg by mouth every evening.     [name] Kitchen atorvastatin (LIPITOR) 80 MG tablet TAKE 1 TABLET BY MOUTH ONCE DAILY AT  6  IN  THE  EVENING (Patient taking differently: Take 80 mg by mouth daily. TAKE 1 TABLET BY MOUTH ONCE DAILY AT  6  IN  THE   EVENING) 90 tablet 2  . carvedilol (COREG) 12.5 MG tablet Take 1 tablet (12.5 mg total) by mouth 2 (two) times daily. 180 tablet 3  . clopidogrel (PLAVIX) 75 MG tablet Take 1 tablet by mouth once daily (Patient taking differently: Take 75 mg by mouth daily. ) 60 tablet 3  . dapagliflozin propanediol (FARXIGA) 10 MG TABS tablet Take 10 mg by mouth daily before breakfast. 30 tablet 11  . ezetimibe (ZETIA) 10 MG tablet Take 1 tablet by mouth once daily (Patient taking differently: Take 10 mg by mouth daily. ) 30 tablet 6  . furosemide (LASIX) 20 MG tablet Take 20 mg by mouth daily.    [name] Kitchen gabapentin (NEURONTIN) 100 MG capsule Take 100 mg by mouth 2 (two) times daily.     [name] Kitchen icosapent Ethyl ([name]) 1 g capsule Take 2 capsules (2 g total) by mouth 2 (two) times daily. 120 capsule 4  . LORazepam (ATIVAN) 1 MG tablet Take  0.5-1 mg by mouth daily.   1  . Multiple Vitamins-Minerals (MULTIVITAMIN WITH MINERALS) tablet Take 1 tablet by mouth daily.    [name] Kitchen omeprazole (PRILOSEC) 20 MG capsule Take 20 mg by mouth daily.     [name] Kitchen rOPINIRole (REQUIP) 0.5 MG tablet Take 0.5 mg by mouth at bedtime.   0  . sacubitril-valsartan (ENTRESTO) 49-51 MG Take 1 tablet by mouth 2 (two) times daily. 60 tablet 3  . spironolactone (ALDACTONE) 25 MG tablet Take 12.5 mg by mouth daily.     [name] Kitchen zinc gluconate 50 MG tablet Take 50 mg by mouth daily.    [name] Kitchen acetaminophen (TYLENOL) 325 MG tablet Take 325 mg by mouth every 6 (six) hours as needed for mild pain or headache.     . loperamide (IMODIUM) 2 MG capsule Take 2 mg by mouth 4 (four) times daily as needed for diarrhea or loose stools (ibs).     . nicotine (NICODERM CQ - DOSED IN MG/24 HOURS) 21 mg/24hr patch Place 21 mg onto the skin daily. (Patient not taking: Reported on 12/10/2019)    . nitroGLYCERIN (NITROSTAT) 0.4 MG SL tablet Place 1 tablet (0.4 mg total) under the tongue every 5 (five) minutes x 3 doses as needed for chest pain. (Patient not taking: Reported on 11/12/2019) 30 tablet 12      Allergies:    Allergies  Allergen Reactions  . Tape Other (See Comments)    PEELS SKIN OFF  (PAPER TAPE IS FINE)    Social History:   Social History   Tobacco Use  . Smoking status: Current Some Day Smoker    Packs/day: 0.10    Years: 15.00    Pack years: 1.50    Types: Cigarettes  . Smokeless tobacco: Never Used  . Tobacco comment: smokes a cigarette occasionally  Substance Use Topics  . Alcohol use: Not Currently    Comment: occasionally    Family History:   The patient's family history includes Breast cancer in her maternal grandmother; CAD in her father; Colon polyps (age of onset: 33) in her father; Dementia in her father; Diabetes in her father and mother; Heart failure in an other family member; Hypertension in her father and mother; Stroke in her father and mother. There is no history of Colon cancer.  ROS:  Please see the history of present illness.  All other ROS reviewed and negative.     Physical Exam/Data:   Vitals:   12/09/19 2028 12/10/19 0154 12/10/19 0627  BP:  107/68 112/76  Pulse:  80 71  Resp:  17 18  Temp:  97.8 F (36.6 C) 98.1 F (36.7 C)  TempSrc:   Oral  SpO2:  99% 100%  Weight: 59 kg    Height: 5' 0.5" (1.537 m)     No intake or output data in the 24 hours ending 12/10/19 0951 Filed Weights   12/09/19 2028  Weight: 59 kg   Body mass index is 24.98 kg/m.   Gen: Patient appears comfortable at rest. HEENT: Conjunctiva and lids normal. Neck: Supple, no elevated JVP or carotid bruits, no thyromegaly. Lungs: Clear to auscultation, nonlabored breathing at rest. Cardiac: Regular rate and rhythm, no S3. Abdomen: Soft, bowel sounds present, no guarding or rebound. Extremities: No pitting edema, distal pulses 2+. Skin: Warm and dry. Musculoskeletal: No kyphosis. Neuropsychiatric: Alert and oriented x3, affect grossly appropriate.  EKG:  An ECG dated 12-09-2019 was personally reviewed today and demonstrated:  Sinus rhythm with  nonspecific T wave changes.  Telemetry:  I personally reviewed telemetry which shows sinus rhythm.  Relevant CV Studies:  Carotid Doppler 10-21-2019: Summary:  Right Carotid: Velocities in the right ICA are consistent with a 40-59%         stenosis.   Left Carotid: Velocities in the left ICA are consistent with a 40-59%  stenosis.   Vertebrals: Bilateral vertebral arteries demonstrate antegrade flow.  Subclavians: Normal flow hemodynamics were seen in bilateral subclavian        arteries.   Cardiac catheterization 08-19-2019: 1. Normal filling pressures.  2. Preserved cardiac output.  3. No obstructive CAD, patent stents.  No explanation for chest pain, ?vasospasm.   Right Heart Pressures R[facility] Procedural Findings: Hemodynamics (mmHg) RA mean 5 RV 25/7 PA 29/3, mean 15 PCWP mean 12 LV 118/14 AO 127/76  Oxygen saturations: PA 72% AO 100%  Cardiac Output (Fick) 4.76  Cardiac Index (Fick) 3.04   Echocardiogram 05-24-2019: 1. Left ventricular ejection fraction, by visual estimation, is 30 to  35%. The left ventricle has moderately decreased function. There is mildly  increased left ventricular hypertrophy.  2. Elevated left atrial pressure.  3. Left ventricular diastolic parameters are consistent with Grade I  diastolic dysfunction (impaired relaxation).  4. The left ventricle demonstrates global hypokinesis.  5. Global right ventricle has normal systolic function.The right  ventricular size is normal. No increase in right ventricular wall  thickness.  6. Left atrial size was normal.  7. Right atrial size was normal.  8. The mitral valve is normal in structure. Trivial mitral valve  regurgitation. No evidence of mitral stenosis.  9. The tricuspid valve is normal in structure.  10. The aortic valve is tricuspid. Aortic valve regurgitation is not  visualized. No evidence of aortic valve sclerosis or stenosis.  11. The pulmonic valve was not well  visualized. Pulmonic valve  regurgitation is not visualized.  12. The inferior vena cava is normal in size with greater than 50%  respiratory variability, suggesting right atrial pressure of 3 mmHg.   Laboratory Data:  Chemistry Recent Labs  Lab 12/09/19 2105  NA 138  K 4.2  CL 105  CO2 21*  GLUCOSE 96  BUN 25*  CREATININE 1.16*  CALCIUM 9.4  GFRNONAA 52*  GFRAA >60  ANIONGAP 12    No results for input(s): PROT, ALBUMIN, AST, ALT, ALKPHOS, BILITOT in the last 168 hours. Hematology Recent Labs  Lab 12/09/19 2105  WBC 9.2  RBC 3.65*  HGB 10.8*  HCT 33.5*  MCV 91.8  MCH 29.6  MCHC 32.2  RDW 16.2*  PLT 394   Cardiac Enzymes Recent Labs  Lab 12/09/19 2105 12/10/19 0155  TROPONINIHS 5 6   BNP Recent Labs  Lab 12/09/19 2105  BNP 80.0     Radiology/Studies:  CT Angio Head W or Wo Contrast  Result Date: 12/10/2019 CLINICAL DATA:  Persistent or recurrent dizziness. EXAM: CT ANGIOGRAPHY HEAD AND NECK TECHNIQUE: Multidetector CT imaging of the head and neck was performed using the standard protocol during bolus administration of intravenous contrast. Multiplanar CT image reconstructions and MIPs were obtained to evaluate the vascular anatomy. Carotid stenosis measurements (when applicable) are obtained utilizing NASCET criteria, using the distal internal carotid diameter as the denominator. CONTRAST:  37mL OMNIPAQUE IOHEXOL 350 MG/ML SOLN COMPARISON:  Brain MRI and intracranial MRA 06/25/2019 FINDINGS: CT HEAD FINDINGS Brain: Small remote left parietal cortex infarct. Apparent low-density in the left medial thalamus on axial slices is not confirmed on the reformats or thinner postcontrast imaging  No hemorrhage, hydrocephalus, or atrophy. Vascular: No hyperdense vessel or unexpected calcification. Skull: Normal. Negative for fracture or focal lesion. Sinuses: Imaged portions are clear. CTA NECK FINDINGS Aortic arch: Negative for aneurysm or dissection. Atheromatous wall  thickening. Two vessel branching. Right carotid system: Atheromatous plaque along the brachiocephalic without flow limiting stenosis. Moderate mixed density plaque at the ICA bulb with narrowing at the ICA origin measuring less than 50%. No downstream beading. Left carotid system: Mild plaque at the common carotid origin. Mixed density plaque at the bifurcation and ICA bulb with 60% narrowing based on coronal reformats. No ulceration or beading. Vertebral arteries: Proximal right subclavian artery stenosis with 55% stenosis based on axial images. The left vertebral artery is dominant. Both vertebral arteries are widely patent to the dura. Skeleton: Degenerative facet spurring in the lower cervical spine. Other neck: No acute finding Upper chest: Negative Review of the MIP images confirms the above findings CTA HEAD FINDINGS Anterior circulation: Atheromatous plaque along the carotid siphons. Left paraclinoid to supraclinoid ICA stent without evidence of stenosis. No aneurysm filling. No branch occlusion or beading. Posterior circulation: Vertebral and basilar arteries are smooth and widely patent. No major PCA branch occlusion or flow limiting stenosis. Negative for aneurysm. Venous sinuses: Diffusely patent Anatomic variants: None significant Review of the MIP images confirms the above findings IMPRESSION: Head CT: Equivocal for interval infarct in the left medial thalamus. No visible cerebellar infarction. CTA: 1. No emergent vascular finding. 2. 60% atheromatous narrowing at the left ICA bulb. 3. Left intracranial ICA stent without residual aneurysm filling or visible in-stent stenosis. 4. 55% proximal right subclavian stenosis. 5. No evidence of vertebral or basilar stenosis. Electronically Signed   By: Monte Fantasia M.D.   On: 12/10/2019 05:18   CT Angio Neck W and/or Wo Contrast  Result Date: 12/10/2019 CLINICAL DATA:  Persistent or recurrent dizziness. EXAM: CT ANGIOGRAPHY HEAD AND NECK TECHNIQUE:  Multidetector CT imaging of the head and neck was performed using the standard protocol during bolus administration of intravenous contrast. Multiplanar CT image reconstructions and MIPs were obtained to evaluate the vascular anatomy. Carotid stenosis measurements (when applicable) are obtained utilizing NASCET criteria, using the distal internal carotid diameter as the denominator. CONTRAST:  34mL OMNIPAQUE IOHEXOL 350 MG/ML SOLN COMPARISON:  Brain MRI and intracranial MRA 06/25/2019 FINDINGS: CT HEAD FINDINGS Brain: Small remote left parietal cortex infarct. Apparent low-density in the left medial thalamus on axial slices is not confirmed on the reformats or thinner postcontrast imaging No hemorrhage, hydrocephalus, or atrophy. Vascular: No hyperdense vessel or unexpected calcification. Skull: Normal. Negative for fracture or focal lesion. Sinuses: Imaged portions are clear. CTA NECK FINDINGS Aortic arch: Negative for aneurysm or dissection. Atheromatous wall thickening. Two vessel branching. Right carotid system: Atheromatous plaque along the brachiocephalic without flow limiting stenosis. Moderate mixed density plaque at the ICA bulb with narrowing at the ICA origin measuring less than 50%. No downstream beading. Left carotid system: Mild plaque at the common carotid origin. Mixed density plaque at the bifurcation and ICA bulb with 60% narrowing based on coronal reformats. No ulceration or beading. Vertebral arteries: Proximal right subclavian artery stenosis with 55% stenosis based on axial images. The left vertebral artery is dominant. Both vertebral arteries are widely patent to the dura. Skeleton: Degenerative facet spurring in the lower cervical spine. Other neck: No acute finding Upper chest: Negative Review of the MIP images confirms the above findings CTA HEAD FINDINGS Anterior circulation: Atheromatous plaque along the carotid siphons. Left paraclinoid  to supraclinoid ICA stent without evidence of  stenosis. No aneurysm filling. No branch occlusion or beading. Posterior circulation: Vertebral and basilar arteries are smooth and widely patent. No major PCA branch occlusion or flow limiting stenosis. Negative for aneurysm. Venous sinuses: Diffusely patent Anatomic variants: None significant Review of the MIP images confirms the above findings IMPRESSION: Head CT: Equivocal for interval infarct in the left medial thalamus. No visible cerebellar infarction. CTA: 1. No emergent vascular finding. 2. 60% atheromatous narrowing at the left ICA bulb. 3. Left intracranial ICA stent without residual aneurysm filling or visible in-stent stenosis. 4. 55% proximal right subclavian stenosis. 5. No evidence of vertebral or basilar stenosis. Electronically Signed   By: Monte Fantasia M.D.   On: 12/10/2019 05:18    Assessment and Plan:   1.  Recurring sense of orthostatic dizziness, predominantly after morning medications, sometimes later in the day as well.  She was not frankly orthostatic today in the ER.  She has undergone a number of relatively recent medication adjustments over the last few months including increase in [place], [place], and addition of [place].  Appears compensated in terms of volume status otherwise.  2.  Ischemic cardiomyopathy with chronic systolic heart failure, LVEF 30 to 35% by last assessment.  She follows with [name] in the heart failure clinic.  3.  CAD status post NSTEMI in 2017 managed with DES to the circumflex and staged DES x2 to the RCA.  Nonobstructive disease with patent stent sites noted by cardiac catheterization in April of this year.  High-sensitivity troponin I levels are normal.  4.  Carotid artery disease, moderate range and nonobstructive by most recent Doppler studies in June.  5.  Posterior communicating artery aneurysm status post embolization.  Further work-up per primary team noted with brain MRI pending.  I would suggest reducing Entresto to 24/26 mg twice  daily for the time being, otherwise continue her present cardiac regimen which is well-rounded.  I suspect some of the symptoms may simply be medication related and require reasonable down titration - would make one adjustment at a time.  She has a previous history of hypotension related to medications per chart review.  Signed, Rozann Lesches, MD  12/10/2019 9:51 AM

## 2019-12-10 NOTE — ED Provider Notes (Signed)
[facility] EMERGENCY DEPARTMENT Provider Note   CSN: [id number] Arrival date & time: 12/09/19  1949     History Chief Complaint  Patient presents with  . Dizziness    Erin [name] is a 59 y.o. female.  Patient with history of nonischemic cardiomyopathy here with intermittent near syncope, dizziness, lightheadedness and fatigue.  She states this is been ongoing since she had medication changes by her cardiologist approximately 3 weeks ago.  Her Coreg was increased, her [name] was increased and she was started on dapagliflozin.  She states since then she thinks the medications are too strong and every time she takes them she "feels like I am in a tunnel".  Several hours after taking the medication she developed tunnel vision, dizziness, lightheadedness, shortness of breath and difficulty walking.  This seems to improve throughout the day but comes and goes.  Sometimes she has trouble standing and trouble walking and feels like she is swelling over.  She is not having these issues prior to the medication changes. She denies any chest pain.  She does have intermittent shortness of breath.  She denies any focal weakness, numbness or tingling.  No difficulty speaking or difficulty swallowing.  59 y.o. with history of CAD, ischemic cardiomyopathy, and PAD  She had NSTEMI in 11/17 with PCI to prox/mid LCx and RCA.  Subsequently, has developed ischemic cardiomyopathy.  Most recent echo in 1/21 showed EF 30-35%.  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  RHC/LHC done with exertional chest heaviness and dyspnea in 4/21 showed normal filling pressures, preserved cardiac output, and nonobstructive mild CAD.  ABIs were normal in 4/21.   The history is provided by the patient.  Dizziness Associated symptoms: nausea and vomiting   Associated symptoms: no chest pain        Past Medical History:  Diagnosis Date  . Anxiety state 03/25/2016  . Basal cell  carcinoma of forehead   . CHF (congestive heart failure) ([place])   . Coronary artery disease    a. 03/11/16 PCI with DES-->Prox/Mid Cx;  b. 03/14/16 PCI with DES x2-->RCA, EF 30-35%.  . Essential hypertension   . GERD (gastroesophageal reflux disease)   . HFrEF (heart failure with reduced ejection fraction) ([place])    a. 10/2016 Echo: EF 35-40%, Gr1 DD, mild focal basal septal hypertrophy, basal inflat, mid inflat, basal antlat AK. Mid infept/inf/antlat, apical lateral sev HK. Mod MR. mildly reduced RV fxn. Mild TR.  [name] Kitchen History of pneumonia   . Hyperlipidemia   . IBS (irritable bowel syndrome)    diarrhea  . Ischemic cardiomyopathy    a. 10/2016 Echo: EF 35-40%, Gr1 DD.  [name] Kitchen Mitral regurgitation   . NSTEMI (non-ST elevated myocardial infarction) ([place]) 03/10/2016  . Pneumonia 03/2016  . Squamous cell cancer of skin of nose   . Thrombocytosis ([place]) 03/26/2016  . Tobacco abuse   . Trichimoniasis   . Wears dentures   . Wears glasses     Patient Active Problem List   Diagnosis Date Noted  . Brain aneurysm 04/15/2019  . Dysphagia 02/27/2018  . Encounter for screening colonoscopy 02/27/2018  . History of Clostridium difficile infection 02/27/2018  . Frequent stools 12/20/2017  . Chronic combined systolic and diastolic CHF (congestive heart failure) ([place]) 06/20/2016  . Hypokalemia   . Acute CHF (congestive heart failure) ([place]) 05/16/2016  . Acute on chronic systolic CHF (congestive heart failure) ([place]) 05/16/2016  . Acute respiratory failure with hypoxia ([place])   .  Thrombocytosis ([place]) 03/26/2016  . Cardiomyopathy, ischemic 03/25/2016  . Chronic combined systolic and diastolic heart failure ([place]) 03/25/2016  . Anxiety state 03/25/2016  . Troponin level elevated 03/25/2016  . CAD (coronary artery disease), native coronary artery 03/25/2016  . Normocytic anemia 03/25/2016  . SOB (shortness of breath) 03/24/2016  . Lightheadedness 03/17/2016  . Hypotension 03/17/2016  . Tobacco abuse  03/12/2016  . NSTEMI (non-ST elevated myocardial infarction) ([place]) 03/11/2016  . Pain in the chest   . Essential hypertension 09/06/2015  . Mixed hyperlipidemia 09/06/2015  . GERD (gastroesophageal reflux disease) 09/06/2015  . Chest pain 09/06/2015    Past Surgical History:  Procedure Laterality Date  . APPENDECTOMY    . BIOPSY  09/20/2018   Procedure: BIOPSY;  Surgeon: Daneil Dolin, MD;  [facility];  Service: Endoscopy;;  colon  . CARDIAC CATHETERIZATION N/A 03/11/2016   Procedure: Left Heart Cath and Coronary Angiography;  Surgeon: Leonie Man, MD;  Location: [place] [facility];  Service: Cardiovascular;  Laterality: N/A;  . CARDIAC CATHETERIZATION N/A 03/11/2016   Procedure: Coronary Stent Intervention;  Surgeon: Leonie Man, MD;  Location: [place] [facility];  Service: Cardiovascular;  Laterality: N/A;  . CARDIAC CATHETERIZATION N/A 03/14/2016   Procedure: Coronary Stent Intervention;  Surgeon: Peter M Martinique, MD;  Location: [place] [facility];  Service: Cardiovascular;  Laterality: N/A;  . CHOLECYSTECTOMY OPEN  1984  . COLONOSCOPY WITH PROPOFOL N/A 09/20/2018   Procedure: COLONOSCOPY WITH PROPOFOL;  Surgeon: Daneil Dolin, MD;  [facility];  Service: Endoscopy;  Laterality: N/A;  10:30am  . CORONARY ANGIOPLASTY WITH STENT PLACEMENT  03/14/2016  . ESOPHAGOGASTRODUODENOSCOPY (EGD) WITH PROPOFOL N/A 09/20/2018   Procedure: ESOPHAGOGASTRODUODENOSCOPY (EGD) WITH PROPOFOL;  Surgeon: Daneil Dolin, MD;  [facility];  Service: Endoscopy;  Laterality: N/A;  . FINGER ARTHROPLASTY Left 05/14/2013   Procedure: LEFT THUMB CARPAL METACARPAL ARTHROPLASTY;  Surgeon: Tennis Must, MD;  Location: [place];  Service: Orthopedics;  Laterality: Left;  . IR ANGIO INTRA EXTRACRAN SEL COM CAROTID INNOMINATE BILAT MOD SED  01/05/2017  . IR ANGIO INTRA EXTRACRAN SEL COM CAROTID INNOMINATE BILAT MOD SED  03/19/2019  . IR ANGIO INTRA EXTRACRAN SEL  INTERNAL CAROTID UNI L MOD SED  04/15/2019  . IR ANGIO VERTEBRAL SEL VERTEBRAL BILAT MOD SED  01/05/2017  . IR ANGIO VERTEBRAL SEL VERTEBRAL BILAT MOD SED  03/19/2019  . IR ANGIOGRAM FOLLOW UP STUDY  04/15/2019  . IR RADIOLOGIST EVAL & MGMT  12/30/2016  . IR TRANSCATH/EMBOLIZ  04/15/2019  . IR US GUIDE VASC ACCESS RIGHT  03/19/2019  . MALONEY DILATION N/A 09/20/2018   Procedure: Venia Minks DILATION;  Surgeon: Daneil Dolin, MD;  [facility];  Service: Endoscopy;  Laterality: N/A;  . RADIOLOGY WITH ANESTHESIA N/A 04/15/2019   Procedure: [employer];  Surgeon: Luanne Bras, MD;  Location: [place];  Service: Radiology;  Laterality: N/A;  . RIGHT/LEFT HEART CATH AND CORONARY ANGIOGRAPHY N/A 08/19/2019   Procedure: RIGHT/LEFT HEART CATH AND CORONARY ANGIOGRAPHY;  Surgeon: Larey Dresser, MD;  Location: [place] [facility];  Service: Cardiovascular;  Laterality: N/A;  . TUBAL LIGATION  1987  . VAGINAL HYSTERECTOMY  2009     OB History    Gravida  4   Para  4   Term  4   Preterm      AB      Living  4     SAB  TAB      Ectopic      Multiple      Live Births              Family History  Problem Relation Age of Onset  . Diabetes Father   . Hypertension Father   . CAD Father   . Colon polyps Father 60       pre-cancerous   . Stroke Father   . Dementia Father   . Stroke Mother   . Hypertension Mother   . Diabetes Mother   . Heart failure Other   . Breast cancer Maternal Grandmother   . Colon cancer Neg Hx     Social History   Tobacco Use  . Smoking status: Current Some Day Smoker    Packs/day: 0.10    Years: 15.00    Pack years: 1.50    Types: Cigarettes  . Smokeless tobacco: Never Used  . Tobacco comment: smokes a cigarette occasionally  Vaping Use  . Vaping Use: Never used  Substance Use Topics  . Alcohol use: Not Currently    Comment: occasionally  . Drug use: Not Currently    Types: Marijuana    Comment: former- 2017 last time     Home Medications Prior to Admission medications   Medication Sig Start Date End Date Taking? Authorizing Provider  acetaminophen (TYLENOL) 325 MG tablet Take 325 mg by mouth every 6 (six) hours as needed for mild pain or headache.     [provider]  ALPRAZolam Duanne Moron) 1 MG tablet Take 1-2 mg by mouth at bedtime as needed for anxiety.     [provider]  aspirin EC 81 MG tablet Take 81 mg by mouth every evening.     [provider]  atorvastatin (LIPITOR) 80 MG tablet TAKE 1 TABLET BY MOUTH ONCE DAILY AT  6  IN  THE  EVENING 08/05/19   Strader, [place] M, PA-C  carvedilol (COREG) 12.5 MG tablet Take 1 tablet (12.5 mg total) by mouth 2 (two) times daily. 11/12/19   Larey Dresser, MD  clopidogrel (PLAVIX) 75 MG tablet Take 1 tablet by mouth once daily 10/08/19   Herminio Commons, MD  dapagliflozin propanediol (FARXIGA) 10 MG TABS tablet Take 10 mg by mouth daily before breakfast. 09/26/19   Larey Dresser, MD  ezetimibe (ZETIA) 10 MG tablet Take 1 tablet by mouth once daily 05/08/19   Herminio Commons, MD  gabapentin (NEURONTIN) 100 MG capsule Take 100 mg by mouth 2 (two) times daily.     [provider]  icosapent Ethyl ([name]) 1 g capsule Take 2 capsules (2 g total) by mouth 2 (two) times daily. 09/12/19   Larey Dresser, MD  loperamide (IMODIUM) 2 MG capsule Take 2 mg by mouth 4 (four) times daily as needed for diarrhea or loose stools (ibs).     [provider]  LORazepam (ATIVAN) 1 MG tablet Take 1 mg by mouth daily as needed for anxiety.  11/09/17   [provider]  Multiple Vitamins-Minerals (MULTIVITAMIN WITH MINERALS) tablet Take 1 tablet by mouth daily.    [provider]  nicotine (NICODERM CQ - DOSED IN MG/24 HOURS) 21 mg/24hr patch Place 21 mg onto the skin daily.    [provider]  nitroGLYCERIN (NITROSTAT) 0.4 MG SL tablet Place 1 tablet (0.4 mg total) under the tongue every 5 (five) minutes x 3  doses as needed for chest pain. Patient not taking: Reported on  11/12/2019 03/05/18   Manuella Ghazi, Pratik D, DO  rOPINIRole (REQUIP) 0.5 MG tablet Take 0.5 mg by mouth at bedtime.  11/13/17   [provider]  sacubitril-valsartan (ENTRESTO) 49-51 MG Take 1 tablet by mouth 2 (two) times daily. 09/03/19   Larey Dresser, MD  spironolactone (ALDACTONE) 25 MG tablet Take 25 mg by mouth daily.    [provider]  zinc gluconate 50 MG tablet Take 50 mg by mouth daily.    [provider]    Allergies    Tape  Review of Systems   Review of Systems  Constitutional: Positive for activity change, appetite change and fatigue.  HENT: Negative for congestion and rhinorrhea.   Eyes: Negative for visual disturbance.  Respiratory: Negative for chest tightness.   Cardiovascular: Negative for chest pain and leg swelling.  Gastrointestinal: Positive for nausea and vomiting. Negative for abdominal pain.  Genitourinary: Negative for dysuria and hematuria.  Musculoskeletal: Negative for arthralgias and myalgias.  Neurological: Positive for dizziness.   all other systems are negative except as noted in the HPI and PMH.    Physical Exam Updated Vital Signs BP 107/68   Pulse 80   Temp 97.8 F (36.6 C)   Resp 17   Ht 5' 0.5" (1.537 m)   Wt 59 kg   SpO2 99%   BMI 24.98 kg/m   Physical Exam Vitals and nursing note reviewed.  Constitutional:      General: She is not in acute distress.    Appearance: She is well-developed.  HENT:     Head: Normocephalic and atraumatic.     Mouth/Throat:     Pharynx: No oropharyngeal exudate.  Eyes:     Conjunctiva/sclera: Conjunctivae normal.     Pupils: Pupils are equal, round, and reactive to light.  Neck:     Comments: No meningismus. Cardiovascular:     Rate and Rhythm: Normal rate and regular rhythm.     Heart sounds: Normal heart sounds. No murmur heard.   Pulmonary:     Effort: Pulmonary effort is normal. No respiratory distress.      Breath sounds: Normal breath sounds.  Abdominal:     Palpations: Abdomen is soft.     Tenderness: There is no abdominal tenderness. There is no guarding or rebound.  Musculoskeletal:        General: No tenderness. Normal range of motion.     Cervical back: Normal range of motion and neck supple.  Skin:    General: Skin is warm.  Neurological:     Mental Status: She is alert and oriented to person, place, and time.     Cranial Nerves: No cranial nerve deficit.     Motor: No abnormal muscle tone.     Coordination: Coordination normal.     Comments: CN 2-12 intact, no ataxia on finger to nose, no nystagmus, 5/5 strength throughout except for slight weakness in left forearm flexion extension which she states is chronic., no pronator drift,  Positive Romberg, ataxic gait  , No ataxia in finger-to-nose, test of skew negative, head impulse testing negative.  Slight weakness in left arm which patient states is chronic.  Psychiatric:        Behavior: Behavior normal.     ED Results / Procedures / Treatments   Labs (all labs ordered are listed, but only abnormal results are displayed) Labs Reviewed  BASIC METABOLIC PANEL - Abnormal; Notable for the following components:      Result Value   CO2  21 (*)    BUN 25 (*)    Creatinine, Ser 1.16 (*)    GFR calc non Af Amer 52 (*)    All other components within normal limits  CBC - Abnormal; Notable for the following components:   RBC 3.65 (*)    Hemoglobin 10.8 (*)    HCT 33.5 (*)    RDW 16.2 (*)    All other components within normal limits  URINALYSIS, ROUTINE W REFLEX MICROSCOPIC - Abnormal; Notable for the following components:   Glucose, UA >=500 (*)    Ketones, ur 20 (*)    All other components within normal limits  SARS CORONAVIRUS 2 BY RT PCR (HOSPITAL ORDER, [facility])  BRAIN NATRIURETIC PEPTIDE  CBG MONITORING, ED  TROPONIN I (HIGH SENSITIVITY)  TROPONIN I (HIGH SENSITIVITY)    EKG EKG  Interpretation  Date/Time:  Monday December 09 2019 20:32:56 EDT Ventricular Rate:  78 PR Interval:  140 QRS Duration: 68 QT Interval:  366 QTC Calculation: 417 R Axis:   10 Text Interpretation: Normal sinus rhythm Nonspecific T wave abnormality Abnormal ECG No significant change was found Confirmed by Ezequiel Essex [id number]) on 12/10/2019 1:50:26 AM   Radiology CT Angio Head W or Wo Contrast  Result Date: 12/10/2019 CLINICAL DATA:  Persistent or recurrent dizziness. EXAM: CT ANGIOGRAPHY HEAD AND NECK TECHNIQUE: Multidetector CT imaging of the head and neck was performed using the standard protocol during bolus administration of intravenous contrast. Multiplanar CT image reconstructions and MIPs were obtained to evaluate the vascular anatomy. Carotid stenosis measurements (when applicable) are obtained utilizing NASCET criteria, using the distal internal carotid diameter as the denominator. CONTRAST:  3mL OMNIPAQUE IOHEXOL 350 MG/ML SOLN COMPARISON:  Brain MRI and intracranial MRA 06/25/2019 FINDINGS: CT HEAD FINDINGS Brain: Small remote left parietal cortex infarct. Apparent low-density in the left medial thalamus on axial slices is not confirmed on the reformats or thinner postcontrast imaging No hemorrhage, hydrocephalus, or atrophy. Vascular: No hyperdense vessel or unexpected calcification. Skull: Normal. Negative for fracture or focal lesion. Sinuses: Imaged portions are clear. CTA NECK FINDINGS Aortic arch: Negative for aneurysm or dissection. Atheromatous wall thickening. Two vessel branching. Right carotid system: Atheromatous plaque along the brachiocephalic without flow limiting stenosis. Moderate mixed density plaque at the ICA bulb with narrowing at the ICA origin measuring less than 50%. No downstream beading. Left carotid system: Mild plaque at the common carotid origin. Mixed density plaque at the bifurcation and ICA bulb with 60% narrowing based on coronal reformats. No ulceration or  beading. Vertebral arteries: Proximal right subclavian artery stenosis with 55% stenosis based on axial images. The left vertebral artery is dominant. Both vertebral arteries are widely patent to the dura. Skeleton: Degenerative facet spurring in the lower cervical spine. Other neck: No acute finding Upper chest: Negative Review of the MIP images confirms the above findings CTA HEAD FINDINGS Anterior circulation: Atheromatous plaque along the carotid siphons. Left paraclinoid to supraclinoid ICA stent without evidence of stenosis. No aneurysm filling. No branch occlusion or beading. Posterior circulation: Vertebral and basilar arteries are smooth and widely patent. No major PCA branch occlusion or flow limiting stenosis. Negative for aneurysm. Venous sinuses: Diffusely patent Anatomic variants: None significant Review of the MIP images confirms the above findings IMPRESSION: Head CT: Equivocal for interval infarct in the left medial thalamus. No visible cerebellar infarction. CTA: 1. No emergent vascular finding. 2. 60% atheromatous narrowing at the left ICA bulb. 3. Left intracranial ICA stent without  residual aneurysm filling or visible in-stent stenosis. 4. 55% proximal right subclavian stenosis. 5. No evidence of vertebral or basilar stenosis. Electronically Signed   By: Monte Fantasia M.D.   On: 12/10/2019 05:18   CT Angio Neck W and/or Wo Contrast  Result Date: 12/10/2019 CLINICAL DATA:  Persistent or recurrent dizziness. EXAM: CT ANGIOGRAPHY HEAD AND NECK TECHNIQUE: Multidetector CT imaging of the head and neck was performed using the standard protocol during bolus administration of intravenous contrast. Multiplanar CT image reconstructions and MIPs were obtained to evaluate the vascular anatomy. Carotid stenosis measurements (when applicable) are obtained utilizing NASCET criteria, using the distal internal carotid diameter as the denominator. CONTRAST:  101mL OMNIPAQUE IOHEXOL 350 MG/ML SOLN COMPARISON:   Brain MRI and intracranial MRA 06/25/2019 FINDINGS: CT HEAD FINDINGS Brain: Small remote left parietal cortex infarct. Apparent low-density in the left medial thalamus on axial slices is not confirmed on the reformats or thinner postcontrast imaging No hemorrhage, hydrocephalus, or atrophy. Vascular: No hyperdense vessel or unexpected calcification. Skull: Normal. Negative for fracture or focal lesion. Sinuses: Imaged portions are clear. CTA NECK FINDINGS Aortic arch: Negative for aneurysm or dissection. Atheromatous wall thickening. Two vessel branching. Right carotid system: Atheromatous plaque along the brachiocephalic without flow limiting stenosis. Moderate mixed density plaque at the ICA bulb with narrowing at the ICA origin measuring less than 50%. No downstream beading. Left carotid system: Mild plaque at the common carotid origin. Mixed density plaque at the bifurcation and ICA bulb with 60% narrowing based on coronal reformats. No ulceration or beading. Vertebral arteries: Proximal right subclavian artery stenosis with 55% stenosis based on axial images. The left vertebral artery is dominant. Both vertebral arteries are widely patent to the dura. Skeleton: Degenerative facet spurring in the lower cervical spine. Other neck: No acute finding Upper chest: Negative Review of the MIP images confirms the above findings CTA HEAD FINDINGS Anterior circulation: Atheromatous plaque along the carotid siphons. Left paraclinoid to supraclinoid ICA stent without evidence of stenosis. No aneurysm filling. No branch occlusion or beading. Posterior circulation: Vertebral and basilar arteries are smooth and widely patent. No major PCA branch occlusion or flow limiting stenosis. Negative for aneurysm. Venous sinuses: Diffusely patent Anatomic variants: None significant Review of the MIP images confirms the above findings IMPRESSION: Head CT: Equivocal for interval infarct in the left medial thalamus. No visible cerebellar  infarction. CTA: 1. No emergent vascular finding. 2. 60% atheromatous narrowing at the left ICA bulb. 3. Left intracranial ICA stent without residual aneurysm filling or visible in-stent stenosis. 4. 55% proximal right subclavian stenosis. 5. No evidence of vertebral or basilar stenosis. Electronically Signed   By: Monte Fantasia M.D.   On: 12/10/2019 05:18    Procedures Procedures (including critical care time)  Medications Ordered in ED Medications  ondansetron (ZOFRAN) injection 4 mg (has no administration in time range)  meclizine (ANTIVERT) tablet 25 mg (has no administration in time range)    ED Course  I have reviewed the triage vital signs and the nursing notes.  Pertinent labs & imaging results that were available during my care of the patient were reviewed by me and considered in my medical decision making (see chart for details).    MDM Rules/Calculators/A&P                         Patient with increased near syncope, dizziness, lightheadedness, difficulty walking and shortness of breath this medication change several weeks ago.  EKG is  unchanged.  Orthostatics are negative.  Patient with ataxic gait and positive Romberg but no appreciable nystagmus.  Orthostatics vitals negative.  Labs are reassuring and unchanged creatinine and hemoglobin and negative troponins.  She denies any chest pain or shortness of breath.  CTA shows no vertebral artery or basilar stenosis.  Does show 60% narrowing of left ICA  Patient remains quite unsteady on her feet with wide-based gait and positive Romberg.  Her orthostatics are negative.  She continues to have intermittent dizziness described as room spinning and lightheadedness.  No chest pain or shortness of breath.  We will plan for MRI in the morning to exclude central cause of her vertigo.  Discussed with Dr. Josephine Cables.   Final Clinical Impression(s) / ED Diagnoses Final diagnoses:  Vertigo  Near syncope    Rx / DC Orders ED  Discharge Orders    None       Venice Liz, Annie Main, MD 12/10/19 [phone]

## 2019-12-11 DIAGNOSIS — I2510 Atherosclerotic heart disease of native coronary artery without angina pectoris: Secondary | ICD-10-CM

## 2019-12-11 DIAGNOSIS — I10 Essential (primary) hypertension: Secondary | ICD-10-CM | POA: Diagnosis not present

## 2019-12-11 DIAGNOSIS — I255 Ischemic cardiomyopathy: Secondary | ICD-10-CM | POA: Diagnosis not present

## 2019-12-11 DIAGNOSIS — I5042 Chronic combined systolic (congestive) and diastolic (congestive) heart failure: Secondary | ICD-10-CM | POA: Diagnosis not present

## 2019-12-11 LAB — BASIC METABOLIC PANEL
Anion gap: 9 (ref 5–15)
BUN: 17 mg/dL (ref 6–20)
CO2: 22 mmol/L (ref 22–32)
Calcium: 8.1 mg/dL — ABNORMAL LOW (ref 8.9–10.3)
Chloride: 107 mmol/L (ref 98–111)
Creatinine, Ser: 0.83 mg/dL (ref 0.44–1.00)
GFR calc Af Amer: >60 mL/min (ref >60)
GFR calc non Af Amer: >60 mL/min (ref >60)
Glucose, Bld: 80 mg/dL (ref 70–99)
Potassium: 4 mmol/L (ref 3.5–5.1)
Sodium: 138 mmol/L (ref 135–145)

## 2019-12-11 LAB — CBC
HCT: 31.4 % — ABNORMAL LOW (ref 36.0–46.0)
Hemoglobin: 9.6 g/dL — ABNORMAL LOW (ref 12.0–15.0)
MCH: 28.5 pg (ref 26.0–34.0)
MCHC: 30.6 g/dL (ref 30.0–36.0)
MCV: 93.2 fL (ref 80.0–100.0)
Platelets: 362 10*3/uL (ref 150–400)
RBC: 3.37 MIL/uL — ABNORMAL LOW (ref 3.87–5.11)
RDW: 15.9 % — ABNORMAL HIGH (ref 11.5–15.5)
WBC: 7.1 10*3/uL (ref 4.0–10.5)
nRBC: 0 % (ref 0.0–0.2)

## 2019-12-11 LAB — MAGNESIUM: Magnesium: 2.2 mg/dL (ref 1.7–2.4)

## 2019-12-11 MED ORDER — CARVEDILOL 6.25 MG PO TABS
6.2500 mg | ORAL_TABLET | Freq: Two times a day (BID) | ORAL | 1 refills | Status: DC
Start: 2019-12-11 — End: 2020-02-11

## 2019-12-11 MED ORDER — SACUBITRIL-VALSARTAN 24-26 MG PO TABS
1.0000 | ORAL_TABLET | Freq: Two times a day (BID) | ORAL | Status: DC
  Administered 2019-12-11: 1 via ORAL
  Filled 2019-12-11 (×5): qty 1

## 2019-12-11 MED ORDER — SODIUM CHLORIDE 0.9 % IV SOLN: INTRAVENOUS | Status: AC

## 2019-12-11 MED ORDER — SODIUM CHLORIDE 0.9 % IV SOLN: Freq: Once | INTRAVENOUS | Status: DC

## 2019-12-11 MED ORDER — SACUBITRIL-VALSARTAN 24-26 MG PO TABS: 1.0000 | ORAL_TABLET | Freq: Two times a day (BID) | ORAL | 1 refills | Status: DC

## 2019-12-11 NOTE — ED Notes (Signed)
Pt states she has a headache, see MAR for intervention

## 2019-12-11 NOTE — Progress Notes (Signed)
Nurse called due to patient's SBP being back to high 80s.  She received 500 mls earlier in the evening and tolerated it well.  250 cc bolus will be given at this time.  Patient's near syncope may also be related to cardiac medications.  We shall continue to monitor patient.

## 2019-12-11 NOTE — ED Notes (Signed)
Ambulated to BR without assist, gait steady.  Informed that we need stool sample to send to lab.

## 2019-12-11 NOTE — Discharge Instructions (Signed)
Dizziness Dizziness is a common problem. It makes you feel unsteady or light-headed. You may feel like you are about to pass out (faint). Dizziness can lead to getting hurt if you stumble or fall. Dizziness can be caused by many things, including:  Medicines.  Not having enough water in your body (dehydration).  Illness. Follow these instructions at home: Eating and drinking   Drink enough fluid to keep your pee (urine) clear or pale yellow. This helps to keep you from getting dehydrated. Try to drink more clear fluids, such as water.  Do not drink alcohol.  Limit how much caffeine you drink or eat, if your doctor tells you to do that.  Limit how much salt (sodium) you drink or eat, if your doctor tells you to do that. Activity   Avoid making quick movements. ? When you stand up from sitting in a chair, steady yourself until you feel okay. ? In the morning, first sit up on the side of the bed. When you feel okay, stand slowly while you hold onto something. Do this until you know that your balance is fine.  If you need to stand in one place for a long time, move your legs often. Tighten and relax the muscles in your legs while you are standing.  Do not drive or use heavy machinery if you feel dizzy.  Avoid bending down if you feel dizzy. Place items in your home so you can reach them easily without leaning over. Lifestyle  Do not use any products that contain nicotine or tobacco, such as cigarettes and e-cigarettes. If you need help quitting, ask your doctor.  Try to lower your stress level. You can do this by using methods such as yoga or meditation. Talk with your doctor if you need help. General instructions  Watch your dizziness for any changes.  Take over-the-counter and prescription medicines only as told by your doctor. Talk with your doctor if you think that you are dizzy because of a medicine that you are taking.  Tell a friend or a family member that you are feeling  dizzy. If he or she notices any changes in your behavior, have this person call your doctor.  Keep all follow-up visits as told by your doctor. This is important. Contact a doctor if:  Your dizziness does not go away.  Your dizziness or light-headedness gets worse.  You feel sick to your stomach (nauseous).  You have trouble hearing.  You have new symptoms.  You are unsteady on your feet.  You feel like the room is spinning. Get help right away if:  You throw up (vomit) or have watery poop (diarrhea), and you cannot eat or drink anything.  You have trouble: ? Talking. ? Walking. ? Swallowing. ? Using your arms, hands, or legs.  You feel generally weak.  You are not thinking clearly, or you have trouble forming sentences. A friend or family member may notice this.  You have: ? Chest pain. ? Pain in your belly (abdomen). ? Shortness of breath. ? Sweating.  Your vision changes.  You are bleeding.  You have a very bad headache.  You have neck pain or a stiff neck.  You have a fever. These symptoms may be an emergency. Do not wait to see if the symptoms will go away. Get medical help right away. Call your local emergency services (911 in the [place] Do not drive yourself to the hospital. Summary  Dizziness makes you feel unsteady or light-headed. You   may feel like you are about to pass out (faint).  Drink enough fluid to keep your pee (urine) clear or pale yellow. Do not drink alcohol.  Avoid making quick movements if you feel dizzy.  Watch your dizziness for any changes. This information is not intended to replace advice given to you by your health care provider. Make sure you discuss any questions you have with your health care provider. Document Revised: 04/28/2017 Document Reviewed: 05/12/2016 Elsevier Patient Education  [place].   Near-Syncope Near-syncope is when you suddenly get weak or dizzy, or you feel like you might pass out (faint). This  may also be called presyncope. This is due to a lack of blood flow to the brain. During an episode of near-syncope, you may:  Feel dizzy, weak, or light-headed.  Feel sick to your stomach (nauseous).  See all white or all black.  See spots.  Have cold, clammy skin. This condition is caused by a sudden decrease in blood flow to the brain. This decrease can result from various causes, but most of those causes are not dangerous. However, near-syncope may be a sign of a serious medical problem, so it is important to seek medical care. Follow these instructions at home: Medicines  Take over-the-counter and prescription medicines only as told by your doctor.  If you are taking blood pressure or heart medicine, get up slowly and spend many minutes getting ready to sit and then stand. This can help with dizziness. General instructions  Be aware of any changes in your symptoms.  Talk with your doctor about your symptoms. You may need to have testing to find the cause of your near-syncope.  If you start to feel like you might pass out, lie down right away. Raise (elevate) your feet above the level of your heart. Breathe deeply and steadily. Wait until all of the symptoms are gone.  Have someone stay with you until you feel stable.  Do not drive, use machinery, or play sports until your doctor says it is okay.  Drink enough fluid to keep your pee (urine) pale yellow.  Keep all follow-up visits as told by your doctor. This is important. Get help right away if you:  Have a seizure.  Have pain in your: ? Chest. ? Belly (abdomen). ? Back.  Faint once or more than once.  Have a very bad headache.  Are bleeding from your mouth or butt.  Have black or tarry poop (stool).  Have a very fast or uneven heartbeat (palpitations).  Are mixed up (confused).  Have trouble walking.  Are very weak.  Have trouble seeing. These symptoms may be an emergency. Do not wait to see if the  symptoms will go away. Get medical help right away. Call your local emergency services (911 in the [place] Do not drive yourself to the hospital. Summary  Near-syncope is when you suddenly get weak or dizzy, or you feel like you might pass out (faint).  This condition is caused by a lack of blood flow to the brain.  Near-syncope may be a sign of a serious medical problem, so it is important to seek medical care. This information is not intended to replace advice given to you by your health care provider. Make sure you discuss any questions you have with your health care provider. Document Revised: 08/17/2018 Document Reviewed: 03/14/2018 Elsevier Patient Education  [place].   IMPORTANT INFORMATION: PAY CLOSE ATTENTION   PHYSICIAN DISCHARGE INSTRUCTIONS  Follow with Primary  care provider  [name], MD  and other consultants as instructed by your Hospitalist Physician  [place] IF SYMPTOMS COME BACK, WORSEN OR NEW PROBLEM DEVELOPS   Please note: You were cared for by a hospitalist during your hospital stay. Every effort will be made to forward records to your primary care provider.  You can request that your primary care provider send for your hospital records if they have not received them.  Once you are discharged, your primary care physician will handle any further medical issues. Please note that NO REFILLS for any discharge medications will be authorized once you are discharged, as it is imperative that you return to your primary care physician (or establish a relationship with a primary care physician if you do not have one) for your post hospital discharge needs so that they can reassess your need for medications and monitor your lab values.  Please get a complete blood count and chemistry panel checked by your Primary MD at your next visit, and again as instructed by your Primary MD.  Get Medicines reviewed and adjusted: Please take  all your medications with you for your next visit with your Primary MD  Laboratory/radiological data: Please request your Primary MD to go over all hospital tests and procedure/radiological results at the follow up, please ask your primary care provider to get all Hospital records sent to his/her office.  In some cases, they will be blood work, cultures and biopsy results pending at the time of your discharge. Please request that your primary care provider follow up on these results.  If you are diabetic, please bring your blood sugar readings with you to your follow up appointment with primary care.    Please call and make your follow up appointments as soon as possible.    Also Note the following: If you experience worsening of your admission symptoms, develop shortness of breath, life threatening emergency, suicidal or homicidal thoughts you must seek medical attention immediately by calling 911 or calling your MD immediately  if symptoms less severe.  You must read complete instructions/literature along with all the possible adverse reactions/side effects for all the Medicines you take and that have been prescribed to you. Take any new Medicines after you have completely understood and accpet all the possible adverse reactions/side effects.   Do not drive when taking Pain medications or sleeping medications (Benzodiazepines)  Do not take more than prescribed Pain, Sleep and Anxiety Medications. It is not advisable to combine anxiety,sleep and pain medications without talking with your primary care practitioner  Special Instructions: If you have smoked or chewed Tobacco  in the last 2 yrs please stop smoking, stop any regular Alcohol  and or any Recreational drug use.  Wear Seat belts while driving.  Do not drive if taking any narcotic, mind altering or controlled substances or recreational drugs or alcohol.

## 2019-12-11 NOTE — ED Notes (Signed)
hospitalist made aware of low BP, orders placed, see MAR for intervention

## 2019-12-11 NOTE — Progress Notes (Signed)
Dr McDowell's consult note reviewed. Not much to add from cardiac standpoint. Entresto lowered to 24/26mg  bid for dizziness, can lower aldactone next and potentially coreg after that if ongoign symptoms. Some low bp's yesterday that have resolved so far today. No further inpatient cardiology testing or interventions are planned, we will sign off inpatient care.   Carlyle Dolly MD

## 2019-12-11 NOTE — Discharge Summary (Signed)
Physician Discharge Summary  MARJORY MEINTS QVZ:[id number] DOB: Apr 02, 1961 DOA: 12/10/2019  PCP: [name], MD Cardiologist: [name]  Admit date: 12/10/2019 Discharge date: 12/11/2019  Admitted From:  HOME  Disposition:   HOME   Recommendations for Outpatient Follow-up:  1. Follow up with PCP in 1 weeks 2. Follow up with cardiology in 2 weeks.   Discharge Condition: STABLE   CODE STATUS: FULL    Brief Hospitalization Summary: Please see all hospital notes, images, labs for full details of the hospitalization. ADMISSION HPI:  Erin [name] is a 59 y.o. female with medical history of systolic and diastolic CHF, coronary artery disease with history of MI November 2017 with DEX to LCx and DES x 2 to RCA, COPD and continued tobacco abuse, hyperlipidemia, hypertension, GERD presenting with 3-week history of intermittent dizziness and lightheadedness with near syncope.  Patient states that these episodes usually occur approximately 30 to 45 minutes after taking her cardiac medications and usually lasts 1 to 2 hours.  She denies any falls or syncope.  Notably, the patient saw her cardiologist, Dr. Marigene Ehlers on 11/12/2019.  At that time, patient's carvedilol was increased to 12.5 mg twice daily.  She also states that she was started on dapagliflozin 10 mg daily and vascepa.  Since this time, the patient feels she has been walking on a mobile and sometimes feels like she has tunnel vision.  She denies any frank focal extremity weakness or visual loss in her visual fields.  She denies any fevers, chills, chest pain, worsening shortness of breath, abdominal pain, medication, melena.  However, patient states that she has had some intermittent nausea and vomiting for the past 2 to 3 weeks.  Is also had chronic loose stools having 3-5 loose bowel movements on a daily basis.  She has not had any hematochezia or melena.  She is not having fevers or chills or recent travel or sick contacts.  There is no dysuria  or hematuria.  In the emergency department, patient was afebrile hemodynamically stable with oxygen saturation 100% room air.  BMP was unremarkable.  Her 0.7-0.9.  CBC was unremarkable.  UA was negative for pyuria.  CT angiogram head and neck showed no emergent vascular findings.  There was 60% atheromatous left ICA bulb narrowing at 55% proximal right subclavian stenosis.  Patient was admitted for further evaluation and treatment.   Pt was admitted for near syncope likely  Related to cardiac medications.  Adjustments made to her medications with good results.  Entresto dose was reduced and coreg was reduced to 6.25 mg BID, pt had gentle IV fluid hydration with good results.  BP has improved and she is stable and ambulating well.  She was seen by cardiology and no further inpatient work up is needed.  Pt stable to discharge home. Close follow up with PCP and cardiologist.  New Rxs given for medications that were adjusted.     Discharge Diagnoses:  Active Problems:   Essential hypertension   Tobacco abuse   Cardiomyopathy, ischemic   Chronic combined systolic and diastolic heart failure (HCC)   CAD (coronary artery disease), native coronary artery   Near syncope   Discharge Instructions:  Allergies as of 12/11/2019      Reactions   Tape Other (See Comments)   PEELS SKIN OFF  (PAPER TAPE IS FINE)      Medication List    STOP taking these medications   Entresto 49-51 MG Generic drug: sacubitril-valsartan Replaced by: sacubitril-valsartan 24-26 MG  nicotine 21 mg/24hr patch Commonly known as: NICODERM CQ - dosed in mg/24 hours     TAKE these medications   acetaminophen 325 MG tablet Commonly known as: TYLENOL Take 325 mg by mouth every 6 (six) hours as needed for mild pain or headache.   ALPRAZolam 1 MG tablet Commonly known as: XANAX Take 1-2 mg by mouth at bedtime.   aspirin EC 81 MG tablet Take 81 mg by mouth every evening.   atorvastatin 80 MG tablet Commonly known  as: LIPITOR TAKE 1 TABLET BY MOUTH ONCE DAILY AT  6  IN  THE  EVENING What changed:   how much to take  how to take this  when to take this   carvedilol 6.25 MG tablet Commonly known as: COREG Take 1 tablet (6.25 mg total) by mouth 2 (two) times daily. What changed:   medication strength  how much to take   clopidogrel 75 MG tablet Commonly known as: PLAVIX Take 1 tablet by mouth once daily   ezetimibe 10 MG tablet Commonly known as: ZETIA Take 1 tablet by mouth once daily   Farxiga 10 MG Tabs tablet Generic drug: dapagliflozin propanediol Take 10 mg by mouth daily before breakfast.   furosemide 20 MG tablet Commonly known as: LASIX Take 20 mg by mouth daily.   gabapentin 100 MG capsule Commonly known as: NEURONTIN Take 100 mg by mouth 2 (two) times daily.   icosapent Ethyl 1 g capsule Commonly known as: [name] Take 2 capsules (2 g total) by mouth 2 (two) times daily.   loperamide 2 MG capsule Commonly known as: IMODIUM Take 2 mg by mouth 4 (four) times daily as needed for diarrhea or loose stools (ibs).   LORazepam 1 MG tablet Commonly known as: ATIVAN Take 0.5-1 mg by mouth daily.   multivitamin with minerals tablet Take 1 tablet by mouth daily.   nitroGLYCERIN 0.4 MG SL tablet Commonly known as: NITROSTAT Place 1 tablet (0.4 mg total) under the tongue every 5 (five) minutes x 3 doses as needed for chest pain.   omeprazole 20 MG capsule Commonly known as: PRILOSEC Take 20 mg by mouth daily.   rOPINIRole 0.5 MG tablet Commonly known as: REQUIP Take 0.5 mg by mouth at bedtime.   sacubitril-valsartan 24-26 MG Commonly known as: ENTRESTO Take 1 tablet by mouth 2 (two) times daily. Replaces: Entresto 49-51 MG   spironolactone 25 MG tablet Commonly known as: ALDACTONE Take 12.5 mg by mouth daily.   zinc gluconate 50 MG tablet Take 50 mg by mouth daily.       Follow-up Information    [name], MD Follow up in 1 week(s).   Specialty:  Internal Medicine Contact information: [place] [address] [phone]        Larey Dresser, MD. Schedule an appointment as soon as possible for a visit in 2 week(s).   Specialty: Cardiology Contact information: [address] [place] [address] [phone]              Allergies  Allergen Reactions  . Tape Other (See Comments)    PEELS SKIN OFF  (PAPER TAPE IS FINE)   Allergies as of 12/11/2019      Reactions   Tape Other (See Comments)   PEELS SKIN OFF  (PAPER TAPE IS FINE)      Medication List    STOP taking these medications   Entresto 49-51 MG Generic drug: sacubitril-valsartan Replaced  by: sacubitril-valsartan 24-26 MG   nicotine 21 mg/24hr patch Commonly known as: NICODERM CQ - dosed in mg/24 hours     TAKE these medications   acetaminophen 325 MG tablet Commonly known as: TYLENOL Take 325 mg by mouth every 6 (six) hours as needed for mild pain or headache.   ALPRAZolam 1 MG tablet Commonly known as: XANAX Take 1-2 mg by mouth at bedtime.   aspirin EC 81 MG tablet Take 81 mg by mouth every evening.   atorvastatin 80 MG tablet Commonly known as: LIPITOR TAKE 1 TABLET BY MOUTH ONCE DAILY AT  6  IN  THE  EVENING What changed:   how much to take  how to take this  when to take this   carvedilol 6.25 MG tablet Commonly known as: COREG Take 1 tablet (6.25 mg total) by mouth 2 (two) times daily. What changed:   medication strength  how much to take   clopidogrel 75 MG tablet Commonly known as: PLAVIX Take 1 tablet by mouth once daily   ezetimibe 10 MG tablet Commonly known as: ZETIA Take 1 tablet by mouth once daily   Farxiga 10 MG Tabs tablet Generic drug: dapagliflozin propanediol Take 10 mg by mouth daily before breakfast.   furosemide 20 MG tablet Commonly known as: LASIX Take 20 mg by mouth daily.   gabapentin 100 MG capsule Commonly known as: NEURONTIN Take 100 mg  by mouth 2 (two) times daily.   icosapent Ethyl 1 g capsule Commonly known as: [name] Take 2 capsules (2 g total) by mouth 2 (two) times daily.   loperamide 2 MG capsule Commonly known as: IMODIUM Take 2 mg by mouth 4 (four) times daily as needed for diarrhea or loose stools (ibs).   LORazepam 1 MG tablet Commonly known as: ATIVAN Take 0.5-1 mg by mouth daily.   multivitamin with minerals tablet Take 1 tablet by mouth daily.   nitroGLYCERIN 0.4 MG SL tablet Commonly known as: NITROSTAT Place 1 tablet (0.4 mg total) under the tongue every 5 (five) minutes x 3 doses as needed for chest pain.   omeprazole 20 MG capsule Commonly known as: PRILOSEC Take 20 mg by mouth daily.   rOPINIRole 0.5 MG tablet Commonly known as: REQUIP Take 0.5 mg by mouth at bedtime.   sacubitril-valsartan 24-26 MG Commonly known as: ENTRESTO Take 1 tablet by mouth 2 (two) times daily. Replaces: Entresto 49-51 MG   spironolactone 25 MG tablet Commonly known as: ALDACTONE Take 12.5 mg by mouth daily.   zinc gluconate 50 MG tablet Take 50 mg by mouth daily.       Procedures/Studies: CT Angio Head W or Wo Contrast  Result Date: 12/10/2019 CLINICAL DATA:  Persistent or recurrent dizziness. EXAM: CT ANGIOGRAPHY HEAD AND NECK TECHNIQUE: Multidetector CT imaging of the head and neck was performed using the standard protocol during bolus administration of intravenous contrast. Multiplanar CT image reconstructions and MIPs were obtained to evaluate the vascular anatomy. Carotid stenosis measurements (when applicable) are obtained utilizing NASCET criteria, using the distal internal carotid diameter as the denominator. CONTRAST:  77mL OMNIPAQUE IOHEXOL 350 MG/ML SOLN COMPARISON:  Brain MRI and intracranial MRA 06/25/2019 FINDINGS: CT HEAD FINDINGS Brain: Small remote left parietal cortex infarct. Apparent low-density in the left medial thalamus on axial slices is not confirmed on the reformats or thinner  postcontrast imaging No hemorrhage, hydrocephalus, or atrophy. Vascular: No hyperdense vessel or unexpected calcification. Skull: Normal. Negative for fracture or focal lesion. Sinuses: Imaged portions are  clear. CTA NECK FINDINGS Aortic arch: Negative for aneurysm or dissection. Atheromatous wall thickening. Two vessel branching. Right carotid system: Atheromatous plaque along the brachiocephalic without flow limiting stenosis. Moderate mixed density plaque at the ICA bulb with narrowing at the ICA origin measuring less than 50%. No downstream beading. Left carotid system: Mild plaque at the common carotid origin. Mixed density plaque at the bifurcation and ICA bulb with 60% narrowing based on coronal reformats. No ulceration or beading. Vertebral arteries: Proximal right subclavian artery stenosis with 55% stenosis based on axial images. The left vertebral artery is dominant. Both vertebral arteries are widely patent to the dura. Skeleton: Degenerative facet spurring in the lower cervical spine. Other neck: No acute finding Upper chest: Negative Review of the MIP images confirms the above findings CTA HEAD FINDINGS Anterior circulation: Atheromatous plaque along the carotid siphons. Left paraclinoid to supraclinoid ICA stent without evidence of stenosis. No aneurysm filling. No branch occlusion or beading. Posterior circulation: Vertebral and basilar arteries are smooth and widely patent. No major PCA branch occlusion or flow limiting stenosis. Negative for aneurysm. Venous sinuses: Diffusely patent Anatomic variants: None significant Review of the MIP images confirms the above findings IMPRESSION: Head CT: Equivocal for interval infarct in the left medial thalamus. No visible cerebellar infarction. CTA: 1. No emergent vascular finding. 2. 60% atheromatous narrowing at the left ICA bulb. 3. Left intracranial ICA stent without residual aneurysm filling or visible in-stent stenosis. 4. 55% proximal right subclavian  stenosis. 5. No evidence of vertebral or basilar stenosis. Electronically Signed   By: Monte Fantasia M.D.   On: 12/10/2019 05:18   CT Angio Neck W and/or Wo Contrast  Result Date: 12/10/2019 CLINICAL DATA:  Persistent or recurrent dizziness. EXAM: CT ANGIOGRAPHY HEAD AND NECK TECHNIQUE: Multidetector CT imaging of the head and neck was performed using the standard protocol during bolus administration of intravenous contrast. Multiplanar CT image reconstructions and MIPs were obtained to evaluate the vascular anatomy. Carotid stenosis measurements (when applicable) are obtained utilizing NASCET criteria, using the distal internal carotid diameter as the denominator. CONTRAST:  24mL OMNIPAQUE IOHEXOL 350 MG/ML SOLN COMPARISON:  Brain MRI and intracranial MRA 06/25/2019 FINDINGS: CT HEAD FINDINGS Brain: Small remote left parietal cortex infarct. Apparent low-density in the left medial thalamus on axial slices is not confirmed on the reformats or thinner postcontrast imaging No hemorrhage, hydrocephalus, or atrophy. Vascular: No hyperdense vessel or unexpected calcification. Skull: Normal. Negative for fracture or focal lesion. Sinuses: Imaged portions are clear. CTA NECK FINDINGS Aortic arch: Negative for aneurysm or dissection. Atheromatous wall thickening. Two vessel branching. Right carotid system: Atheromatous plaque along the brachiocephalic without flow limiting stenosis. Moderate mixed density plaque at the ICA bulb with narrowing at the ICA origin measuring less than 50%. No downstream beading. Left carotid system: Mild plaque at the common carotid origin. Mixed density plaque at the bifurcation and ICA bulb with 60% narrowing based on coronal reformats. No ulceration or beading. Vertebral arteries: Proximal right subclavian artery stenosis with 55% stenosis based on axial images. The left vertebral artery is dominant. Both vertebral arteries are widely patent to the dura. Skeleton: Degenerative facet  spurring in the lower cervical spine. Other neck: No acute finding Upper chest: Negative Review of the MIP images confirms the above findings CTA HEAD FINDINGS Anterior circulation: Atheromatous plaque along the carotid siphons. Left paraclinoid to supraclinoid ICA stent without evidence of stenosis. No aneurysm filling. No branch occlusion or beading. Posterior circulation: Vertebral and basilar arteries are smooth  and widely patent. No major PCA branch occlusion or flow limiting stenosis. Negative for aneurysm. Venous sinuses: Diffusely patent Anatomic variants: None significant Review of the MIP images confirms the above findings IMPRESSION: Head CT: Equivocal for interval infarct in the left medial thalamus. No visible cerebellar infarction. CTA: 1. No emergent vascular finding. 2. 60% atheromatous narrowing at the left ICA bulb. 3. Left intracranial ICA stent without residual aneurysm filling or visible in-stent stenosis. 4. 55% proximal right subclavian stenosis. 5. No evidence of vertebral or basilar stenosis. Electronically Signed   By: Monte Fantasia M.D.   On: 12/10/2019 05:18   MR BRAIN WO CONTRAST  Result Date: 12/10/2019 CLINICAL DATA:  Dizziness and vertigo beginning 2 weeks ago. EXAM: MRI HEAD WITHOUT CONTRAST TECHNIQUE: Multiplanar, multiecho pulse sequences of the brain and surrounding structures were obtained without intravenous contrast. COMPARISON:  CT angiography same day.  MRI 06/25/2019 FINDINGS: Brain: Diffusion imaging does not show any acute or subacute infarction. No focal abnormality affects the brainstem or cerebellum. Old left parieto-occipital cortical and subcortical infarction unchanged since the previous exam. No other brain abnormality is seen. No mass lesion, hemorrhage, hydrocephalus or extra-axial collection. Vascular: Major vessels at the base of the brain show flow. Skull and upper cervical spine: Negative Sinuses/Orbits: Clear/normal Other: None IMPRESSION: No change. No  acute finding. Normal study with exception of an old small cortical and subcortical infarction at the left parietooccipital junction. Electronically Signed   By: Nelson Chimes M.D.   On: 12/10/2019 09:52      Subjective: Pt without complaints, says she feels much better today.    Discharge Exam: Vitals:   12/11/19 1429 12/11/19 1459  BP: 109/80 122/82  Pulse: 99 93  Resp: (!) 23 (!) 21  Temp:    SpO2: 98% 100%   Vitals:   12/11/19 1330 12/11/19 1359 12/11/19 1429 12/11/19 1459  BP: 132/78 (!) 97/59 109/80 122/82  Pulse: 91 98 99 93  Resp: (!) 29 (!) 21 (!) 23 (!) 21  Temp:      TempSrc:      SpO2: 98% 99% 98% 100%  Weight:      Height:       General: Pt is alert, awake, not in acute distress Cardiovascular:  normal S1/S2 +, no rubs, no gallops Respiratory: CTA bilaterally, no wheezing, no rhonchi Abdominal: Soft, NT, ND, bowel sounds + Extremities: no edema, no cyanosis   The results of significant diagnostics from this hospitalization (including imaging, microbiology, ancillary and laboratory) are listed below for reference.     Microbiology: Recent Results (from the past 240 hour(s))  SARS Coronavirus 2 by RT PCR (hospital order, performed in [facility] hospital lab) Nasopharyngeal Nasopharyngeal Swab     Status: None   Collection Time: 12/10/19  6:02 AM   Specimen: Nasopharyngeal Swab  Result Value Ref Range Status   SARS Coronavirus 2 NEGATIVE NEGATIVE Final    Comment: (NOTE) SARS-CoV-2 target nucleic acids are NOT DETECTED.  The SARS-CoV-2 RNA is generally detectable in upper and lower respiratory specimens during the acute phase of infection. The lowest concentration of SARS-CoV-2 viral copies this assay can detect is 250 copies / mL. A negative result does not preclude SARS-CoV-2 infection and should not be used as the sole basis for treatment or other patient management decisions.  A negative result may occur with improper specimen collection / handling,  submission of specimen other than nasopharyngeal swab, presence of viral mutation(s) within the areas targeted by this assay, and  inadequate number of viral copies (<250 copies / mL). A negative result must be combined with clinical observations, patient history, and epidemiological information.  Fact Sheet for Patients:   StrictlyIdeas.no  Fact Sheet for Healthcare Providers: BankingDealers.co.za  This test is not yet approved or  cleared by the [place] FDA and has been authorized for detection and/or diagnosis of SARS-CoV-2 by FDA under an Emergency Use Authorization (EUA).  This EUA will remain in effect (meaning this test can be used) for the duration of the COVID-19 declaration under Section 564(b)(1) of the Act, 21 U.S.C. section 360bbb-3(b)(1), unless the authorization is terminated or revoked sooner.  Performed at [facility], [address], [place], [place] [address]      Labs: BNP (last 3 results) Recent Labs    06/25/19 0558 12/09/19 2105  BNP 80.0 87.5   Basic Metabolic Panel: Recent Labs  Lab 12/09/19 2105 12/11/19 0338  NA 138 138  K 4.2 4.0  CL 105 107  CO2 21* 22  GLUCOSE 96 80  BUN 25* 17  CREATININE 1.16* 0.83  CALCIUM 9.4 8.1*  MG  --  2.2   Liver Function Tests: Recent Labs  Lab 12/10/19 0155  AST 26  ALT 20  ALKPHOS 56  BILITOT 0.9  PROT 6.7  ALBUMIN 3.7   No results for input(s): LIPASE, AMYLASE in the last 168 hours. No results for input(s): AMMONIA in the last 168 hours. CBC: Recent Labs  Lab 12/09/19 2105 12/11/19 0338  WBC 9.2 7.1  HGB 10.8* 9.6*  HCT 33.5* 31.4*  MCV 91.8 93.2  PLT 394 362   Cardiac Enzymes: No results for input(s): CKTOTAL, CKMB, CKMBINDEX, TROPONINI in the last 168 hours. BNP: Invalid input(s): POCBNP CBG: No results for input(s): GLUCAP in the last 168 hours. D-Dimer No results for input(s): DDIMER in the last 72 hours. Hgb A1c Recent Labs     12/10/19 1459  HGBA1C 5.9*   Lipid Profile No results for input(s): CHOL, HDL, LDLCALC, TRIG, CHOLHDL, LDLDIRECT in the last 72 hours. Thyroid function studies Recent Labs    12/10/19 1459  TSH 0.674   Anemia work up Recent Labs    12/09/19 2105 12/10/19 1459  VITAMINB12 257  --   FOLATE  --  67.2   Urinalysis    Component Value Date/Time   COLORURINE YELLOW 12/10/2019 0210   APPEARANCEUR CLEAR 12/10/2019 0210   LABSPEC 1.022 12/10/2019 0210   PHURINE 5.0 12/10/2019 0210   GLUCOSEU >=500 (A) 12/10/2019 0210   HGBUR NEGATIVE 12/10/2019 0210   BILIRUBINUR NEGATIVE 12/10/2019 0210   KETONESUR 20 (A) 12/10/2019 0210   PROTEINUR NEGATIVE 12/10/2019 0210   NITRITE NEGATIVE 12/10/2019 0210   LEUKOCYTESUR NEGATIVE 12/10/2019 0210   Sepsis Labs Invalid input(s): PROCALCITONIN,  WBC,  LACTICIDVEN Microbiology Recent Results (from the past 240 hour(s))  SARS Coronavirus 2 by RT PCR (hospital order, performed in [place] hospital lab) Nasopharyngeal Nasopharyngeal Swab     Status: None   Collection Time: 12/10/19  6:02 AM   Specimen: Nasopharyngeal Swab  Result Value Ref Range Status   SARS Coronavirus 2 NEGATIVE NEGATIVE Final    Comment: (NOTE) SARS-CoV-2 target nucleic acids are NOT DETECTED.  The SARS-CoV-2 RNA is generally detectable in upper and lower respiratory specimens during the acute phase of infection. The lowest concentration of SARS-CoV-2 viral copies this assay can detect is 250 copies / mL. A negative result does not preclude SARS-CoV-2 infection and should not be used as the sole  basis for treatment or other patient management decisions.  A negative result may occur with improper specimen collection / handling, submission of specimen other than nasopharyngeal swab, presence of viral mutation(s) within the areas targeted by this assay, and inadequate number of viral copies (<250 copies / mL). A negative result must be combined with  clinical observations, patient history, and epidemiological information.  Fact Sheet for Patients:   StrictlyIdeas.no  Fact Sheet for Healthcare Providers: BankingDealers.co.za  This test is not yet approved or  cleared by the [place] FDA and has been authorized for detection and/or diagnosis of SARS-CoV-2 by FDA under an Emergency Use Authorization (EUA).  This EUA will remain in effect (meaning this test can be used) for the duration of the COVID-19 declaration under Section 564(b)(1) of the Act, 21 U.S.C. section 360bbb-3(b)(1), unless the authorization is terminated or revoked sooner.  Performed at [facility], [address], [place], [place] [address]    Time coordinating discharge:   SIGNED:  Irwin Brakeman, MD  [facility] 12/11/2019, 3:16 PM How to contact the [facility] Attending or Consulting provider [place] or covering provider during after hours [place], for this patient?  1. Check the care team in [facility] and look for a) attending/consulting [facility] provider listed and b) the [facility] team listed 2. Log into [email or web address] and use [place]'s universal password to access. If you do not have the password, please contact the hospital operator. 3. Locate the [facility] provider you are looking for under [facility] and page to a number that you can be directly reached. 4. If you still have difficulty reaching the provider, please page the [facility] (Director on Call) for the Hospitalists listed on amion for assistance.

## 2019-12-11 NOTE — Evaluation (Signed)
Physical Therapy Evaluation Patient Details Name: Erin [name] MRN: [id number] DOB: May 31, 1960 Today's Date: 12/11/2019   History of Present Illness  Erin [name] is a 59 y.o. female with medical history of systolic and diastolic CHF, coronary artery disease with history of MI November 2017 with DEX to LCx and DES x 2 to RCA, COPD and continued tobacco abuse, hyperlipidemia, hypertension, GERD presenting with 3-week history of intermittent dizziness and lightheadedness with near syncope.  Patient states that these episodes usually occur approximately 30 to 45 minutes after taking her cardiac medications and usually lasts 1 to 2 hours.  She denies any falls or syncope.  Notably, the patient saw her cardiologist, Dr. Marigene Ehlers on 11/12/2019.  At that time, patient's carvedilol was increased to 12.5 mg twice daily.  She also states that she was started on dapagliflozin 10 mg daily and vascepa.  Since this time, the patient feels she has been walking on a mobile and sometimes feels like she has tunnel vision.  She denies any frank focal extremity weakness or visual loss in her visual fields.  She denies any fevers, chills, chest pain, worsening shortness of breath, abdominal pain, medication, melena.    Clinical Impression  Patient functioning near baseline for functional mobility and gait, other than c/o mild dizziness during ambulation without loss of balance.  Plan:  Patient discharged from physical therapy to care of nursing for ambulation daily as tolerated for length of stay.     Follow Up Recommendations No PT follow up;Supervision - Intermittent    Equipment Recommendations  None recommended by PT    Recommendations for Other Services       Precautions / Restrictions Precautions Precautions: None Restrictions Weight Bearing Restrictions: No      Mobility  Bed Mobility Overal bed mobility: Independent                Transfers Overall transfer level: Modified independent                   Ambulation/Gait Ambulation/Gait assistance: Modified independent (Device/Increase time);Supervision Gait Distance (Feet): 100 Feet Assistive device: None Gait Pattern/deviations: WFL(Within Functional Limits) Gait velocity: decreased   General Gait Details: grossly WFL except has to slow cadence when feeling mild dizziness  Stairs            Wheelchair Mobility    Modified Rankin (Stroke Patients Only)       Balance Overall balance assessment: Mild deficits observed, not formally tested                                           Pertinent Vitals/Pain Pain Assessment: No/denies pain    Home Living Family/patient expects to be discharged to:: Private residence Living Arrangements: Spouse/significant other;Children Available Help at Discharge: Available 24 hours/day Type of Home: House Home Access: Stairs to enter Entrance Stairs-Rails: None Entrance Stairs-Number of Steps: 3 Home Layout: Two level;Able to live on main level with bedroom/bathroom Home Equipment: None      Prior Function Level of Independence: Independent         Comments: Hydrographic surveyor, drives     Journalist, newspaper        Extremity/Trunk Assessment   Upper Extremity Assessment Upper Extremity Assessment: Overall WFL for tasks assessed    Lower Extremity Assessment Lower Extremity Assessment: Overall WFL for tasks assessed    Cervical / Trunk  Assessment Cervical / Trunk Assessment: Normal  Communication   Communication: No difficulties  Cognition Arousal/Alertness: Awake/alert Behavior During Therapy: WFL for tasks assessed/performed Overall Cognitive Status: Within Functional Limits for tasks assessed                                        General Comments      Exercises     Assessment/Plan    PT Assessment Patent does not need any further PT services  PT Problem List         PT Treatment Interventions       PT Goals (Current goals can be found in the Care Plan section)  Acute Rehab PT Goals Patient Stated Goal: return home with family to assist PT Goal Formulation: With patient Time For Goal Achievement: 12/11/19 Potential to Achieve Goals: Good    Frequency     Barriers to discharge        Co-evaluation               AM-PAC PT "6 Clicks" Mobility  Outcome Measure Help needed turning from your back to your side while in a flat bed without using bedrails?: None Help needed moving from lying on your back to sitting on the side of a flat bed without using bedrails?: None Help needed moving to and from a bed to a chair (including a wheelchair)?: None Help needed standing up from a chair using your arms (e.g., wheelchair or bedside chair)?: None Help needed to walk in hospital room?: A Little Help needed climbing 3-5 steps with a railing? : A Little 6 Click Score: 22    End of Session   Activity Tolerance: Patient tolerated treatment well;Patient limited by fatigue Patient left: in bed;with call bell/phone within reach Nurse Communication: Mobility status PT Visit Diagnosis: Unsteadiness on feet (R26.81);Other abnormalities of gait and mobility (R26.89);Muscle weakness (generalized) (M62.81)    Time: 6389-3734 PT Time Calculation (min) (ACUTE ONLY): 20 min   Charges:   PT Evaluation $PT Eval Moderate Complexity: 1 Mod PT Treatments $Therapeutic Activity: 8-22 mins        11:01 AM, 12/11/19 Lonell Grandchild, MPT Physical Therapist with [facility] [phone] office [phone] mobile phone

## 2019-12-11 NOTE — ED Notes (Signed)
hospitalist aware of continued low bp readings, see MAR for intervention

## 2019-12-16 ENCOUNTER — Other Ambulatory Visit (HOSPITAL_COMMUNITY): Payer: Self-pay | Admitting: Family Medicine

## 2019-12-16 DIAGNOSIS — R310 Gross hematuria: Secondary | ICD-10-CM

## 2019-12-16 DIAGNOSIS — N3091 Cystitis, unspecified with hematuria: Secondary | ICD-10-CM

## 2019-12-18 ENCOUNTER — Other Ambulatory Visit: Payer: Self-pay

## 2019-12-18 ENCOUNTER — Ambulatory Visit (HOSPITAL_COMMUNITY)
Admission: RE | Admit: 2019-12-18 | Discharge: 2019-12-18 | Disposition: A | Discharge status: Home / Self Care | Payer: PRIVATE HEALTH INSURANCE | Source: Ambulatory Visit | Attending: Internal Medicine | Admitting: Internal Medicine

## 2019-12-18 VITALS — BP 98/64 | HR 77 | Wt 129.6 lb

## 2019-12-18 DIAGNOSIS — Z79899 Other long term (current) drug therapy: Secondary | ICD-10-CM | POA: Diagnosis not present

## 2019-12-18 DIAGNOSIS — I255 Ischemic cardiomyopathy: Secondary | ICD-10-CM | POA: Diagnosis not present

## 2019-12-18 DIAGNOSIS — I2510 Atherosclerotic heart disease of native coronary artery without angina pectoris: Secondary | ICD-10-CM | POA: Insufficient documentation

## 2019-12-18 DIAGNOSIS — I739 Peripheral vascular disease, unspecified: Secondary | ICD-10-CM | POA: Insufficient documentation

## 2019-12-18 DIAGNOSIS — F17210 Nicotine dependence, cigarettes, uncomplicated: Secondary | ICD-10-CM | POA: Insufficient documentation

## 2019-12-18 DIAGNOSIS — Z86718 Personal history of other venous thrombosis and embolism: Secondary | ICD-10-CM | POA: Diagnosis not present

## 2019-12-18 DIAGNOSIS — I6529 Occlusion and stenosis of unspecified carotid artery: Secondary | ICD-10-CM | POA: Insufficient documentation

## 2019-12-18 DIAGNOSIS — Z955 Presence of coronary angioplasty implant and graft: Secondary | ICD-10-CM | POA: Diagnosis not present

## 2019-12-18 DIAGNOSIS — I5022 Chronic systolic (congestive) heart failure: Secondary | ICD-10-CM | POA: Diagnosis not present

## 2019-12-18 DIAGNOSIS — I252 Old myocardial infarction: Secondary | ICD-10-CM | POA: Diagnosis not present

## 2019-12-18 NOTE — Progress Notes (Signed)
PCP: [name], MD Cardiology: [name] HF Cardiology: [name]  HPI:  59 y.o. with history of CAD, ischemic cardiomyopathy, and PAD was referred by [name] for evaluation of CHF.  She had NSTEMI in 11/17 with PCI to prox/mid LCx and RCA.  Subsequently, has developed ischemic cardiomyopathy.  Most recent echo in 1/21 showed EF 30-35%.  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   RHC/LHC done with exertional chest heaviness and dyspnea in 4/21 showed normal filling pressures, preserved cardiac output, and nonobstructive mild CAD.  ABIs were normal in 4/21.   She returned for followup of CHF 11/12/19 with [name].  Rare smoking now.  No significant exertional dyspnea.  Rare atypical chest pain. No orthopnea/PND.  Weight down 3 lbs. No lightheadedness.   Today she returns to HF clinic for pharmacist medication titration. At last visit with MD (11/12/19), carvedilol was increased to 9.375 mg BID x 4 days then 12.5 mg BID thereafter. She was then hospitalized for syncope, likely related to cardiac medications. At discharge, carvedilol was reduced to 6.25 mg BID and Entresto was reduced to 24/26 mg BID. Overall, she doesn't feel great today. Endorses significant lightheadedness and dizziness that have interfered with her ADL's. Episodes occur when standing from seated position and ~30 minutes after taking medications which lasts 10-15 minutes. Endorses low appetite and N/V for the past few months. Patient attributes dizziness to reduced food intake as she can only eat two small meals a day. She feels like "some of the medications are hurting my stomach and making my appetite worse". Denies chest pain or palpitations. Reported improved breathing, but still gets SOB when walking up and down stairs at home and when walking extensively at work. Home weights stable, ranging from 128-131 lbs. Not on a diuretic. No LEE on exam. Denies PND/orthopnea. Patient  reports optimal medication adherence and has no problems getting medications.   HF Medications: Carvedilol 6.25 mg BID Entresto 24-26 mg BID                                  Spironolactone 12.5 mg daily    Dapagliflozin 10 mg daily                            Has the patient been experiencing any side effects to the medications prescribed?  Yes - lightheadedness & dizziness ~30 minutes after taking medications (both AM and PM administrations). She also believes her stomach upset is due to medications, either [name] or dapagliflozin.   Does the patient have any problems obtaining medications due to transportation or finances?   No - has Pharmacist, community.  Understanding of regimen: good Understanding of indications: good Potential of compliance: good Patient understands to avoid NSAIDs. Patient understands to avoid decongestants.    Pertinent Lab Values: 12/11/19 . Serum creatinine 0.83, BUN 17, Potassium 4.0, Sodium 138, BNP 80 pg/mL   Vital Signs: . Weight: 129.6 lbs (last clinic weight: 130 lbs) . Blood pressure: 98/64  . Heart rate: 77 bpm  Assessment: 1. CAD: S/p NSTEMI in 11/17 with DEX to LCx and DES x 2 to RCA.  Cardiolite in 10/20 with EF <30%, inferior/inferolateral infarct with peri-infarct ischemia. LHC in 4/21 showed nonobstructive mild CAD.  Rare atypical chest pain.  - Continue atorvastatin and Zetia.  Good lipids in 4/21.  - I  am concerned that her N/V and loss of appetite are related to her [name]. Will discontinue [name] today to see if this resolves. She recently got a fasting lipid panel drawn at her PCP. I will call to follow up results to determine if we need to start additional triglyceride lowering therapy.  - Continue ASA 81 and Plavix 75 mg daily for now.  She will continue Plavix as long as Dr. Estanislado Pandy feels it is needed post her cranial circulation intervention.   2. Chronic systolic CHF: Ischemic cardiomyopathy. Last echo in 1/21 with EF 30-35%.  RHC  in 4/21 with normal filling pressures and preserved cardiac output.  - NYHA class II. She is not volume overloaded on exam.  - Continue carvedilol 6.25 mg BID - Continue Entresto 24/26 mg BID   - Continue spironolactone 12.5 mg daily.   - Continue dapagliflozin 10 mg daily. - Narrow QRS so not CRT candidate.  - Repeat ECHO scheduled for 02/18/20 with [name]    3. Carotid stenosis: Repeat carotid dopplers in 6/22.    4. Smoking: [name] strongly encouraged her to quit 11/12/19.  She has cut back a lot.   5. 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   Plan: 1) Medication changes: Based on clinical presentation, vital signs and recent labs will discontinue [name] 2 g BID to see if GI symptoms resolve and appetite improves. 2) Follow-up: Pharmacy for medication titration on 12/31/19. Repeat ECHO in October with [name].   Erin [name], PharmD, BCPS, BCCP, CPP Heart Failure Clinic Pharmacist [phone]

## 2019-12-18 NOTE — Patient Instructions (Addendum)
It was a pleasure seeing you today!  MEDICATIONS: -We are changing your medications today -Stop [name] -Call if you have questions about your medications.  NEXT APPOINTMENT: Return to clinic in 3 weeks with Pharmacy Clinic.  In general, to take care of your heart failure: -Limit your fluid intake to 2 Liters (half-gallon) per day.   -Limit your salt intake to ideally 2-3 grams (2000-3000 mg) per day. -Weigh yourself daily and record, and bring that "weight diary" to your next appointment.  (Weight gain of 2-3 pounds in 1 day typically means fluid weight.) -The medications for your heart are to help your heart and help you live longer.   -Please contact us before stopping any of your heart medications.  Call the clinic at [phone] with questions or to reschedule future appointments.

## 2019-12-25 ENCOUNTER — Other Ambulatory Visit: Payer: Self-pay

## 2019-12-25 MED ORDER — EZETIMIBE 10 MG PO TABS: 10.0000 mg | ORAL_TABLET | Freq: Every day | ORAL | 1 refills | Status: DC

## 2019-12-25 NOTE — Telephone Encounter (Signed)
refilled zetia

## 2019-12-31 ENCOUNTER — Ambulatory Visit (HOSPITAL_COMMUNITY)
Admission: RE | Admit: 2019-12-31 | Discharge: 2019-12-31 | Disposition: A | Discharge status: Home / Self Care | Payer: PRIVATE HEALTH INSURANCE | Source: Ambulatory Visit | Attending: Internal Medicine | Admitting: Internal Medicine

## 2019-12-31 ENCOUNTER — Other Ambulatory Visit: Payer: Self-pay

## 2019-12-31 VITALS — BP 96/68 | HR 88 | Wt 127.4 lb

## 2019-12-31 DIAGNOSIS — Z79899 Other long term (current) drug therapy: Secondary | ICD-10-CM | POA: Insufficient documentation

## 2019-12-31 DIAGNOSIS — F17200 Nicotine dependence, unspecified, uncomplicated: Secondary | ICD-10-CM | POA: Insufficient documentation

## 2019-12-31 DIAGNOSIS — Z7902 Long term (current) use of antithrombotics/antiplatelets: Secondary | ICD-10-CM | POA: Diagnosis not present

## 2019-12-31 DIAGNOSIS — I739 Peripheral vascular disease, unspecified: Secondary | ICD-10-CM | POA: Diagnosis not present

## 2019-12-31 DIAGNOSIS — I5022 Chronic systolic (congestive) heart failure: Secondary | ICD-10-CM | POA: Diagnosis not present

## 2019-12-31 DIAGNOSIS — I252 Old myocardial infarction: Secondary | ICD-10-CM | POA: Diagnosis not present

## 2019-12-31 DIAGNOSIS — I255 Ischemic cardiomyopathy: Secondary | ICD-10-CM | POA: Insufficient documentation

## 2019-12-31 DIAGNOSIS — I5042 Chronic combined systolic (congestive) and diastolic (congestive) heart failure: Secondary | ICD-10-CM

## 2019-12-31 DIAGNOSIS — I2510 Atherosclerotic heart disease of native coronary artery without angina pectoris: Secondary | ICD-10-CM | POA: Insufficient documentation

## 2019-12-31 NOTE — Patient Instructions (Addendum)
It was a pleasure seeing you today! ? ?MEDICATIONS: ?-No medication changes today ?-Call if you have questions about your medications. ? ? ?NEXT APPOINTMENT: ?Return to clinic in 2 months with Dr. McLean. ? ?In general, to take care of your heart failure: ?-Limit your fluid intake to 2 Liters (half-gallon) per day.   ?-Limit your salt intake to ideally 2-3 grams (2000-3000 mg) per day. ?-Weigh yourself daily and record, and bring that "weight diary" to your next appointment.  (Weight gain of 2-3 pounds in 1 day typically means fluid weight.) ?-The medications for your heart are to help your heart and help you live longer.   ?-Please contact us before stopping any of your heart medications. ? ?Call the clinic at [phone] with questions or to reschedule future appointments.  ?

## 2019-12-31 NOTE — Progress Notes (Signed)
PCP:Kim, Jeneen Rinks, MD Cardiology: [name] HF Cardiology: [name]  HPI:  59 y.o.with history of CAD, ischemic cardiomyopathy, and PAD was referred by [name] for evaluation of CHF. She had NSTEMI in 03/2016 with PCI to prox/mid LCx and RCA. Subsequently, has developed ischemic cardiomyopathy. Most recent echo in 05/2019 showed EF 30-35%. 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   RHC/LHC done with exertional chest heaviness and dyspnea in 08/2019 showed normal filling pressures, preserved cardiac output, and nonobstructive mild CAD. ABIs were normal in 08/2019.   She returned for followup of CHF 11/12/19 with [name].Rare smoking now. No significant exertional dyspnea. Rare atypical chest pain. No orthopnea/PND. Weight down 3 lbs. No lightheadedness.   She returned to HF clinic for pharmacist medication titration 12/18/19. At previous with MD (11/12/19), carvedilol was increased to 9.375 mg BID x 4 days then 12.5 mg BID thereafter. She was then hospitalized for syncope, likely related to cardiac medications. At discharge, carvedilol was reduced to 6.25 mg BID and Entresto was reduced to 24-26 mg BID. In clinic, she didn't feel great. Endorsed significant lightheadedness and dizziness that had interfered with her ADL's. Episodes occurred when standing from seated position and ~30 minutes after taking medications which lasts 10-15 minutes. Reported low appetite and N/V for the past few months. Patient attributed dizziness to reduced food intake as she was only eating two small meals a day. She felt like "some of the medications are hurting my stomach and making my appetite worse". Denied chest pain or palpitations. Reported improved breathing, but still got SOB when walking up and down stairs and walking extensively at work. Home weights were stable, ranging from 128-131 lbs. Not on a diuretic. No LEE on exam. Denied PND/orthopnea.  Patient reported optimal medication adherence and had no problems getting medications.   Today she returns to HF clinic for pharmacist medication titration. At last visit with pharmacy (2 weeks ago), [name] was discontinued due to N/V and loss of appetite. Today, she says she feels well and reports no more tunnel vision. Still reports low appetite and nausea, and is only able to eat 1-2 small meals a day. Endorses occasional dizzy episodes ~2-3 times a week that subside after 5-6 minutes with rest. Denies fatigue. Reports chest tightness on left side, but likely due to L shoulder pain pending surgery. Denies palpitations. She says her breathing is better. Can walk without feeling short of breath, but does get SOB when walking up stairs. Home weights stable at 130 lbs. Denies LEE, PND, orthopnea. She does not have trouble affording or obtaining her medications.    HF Medications: Carvedilol 6.25 mg BID Entresto 24-26 mg BID                                  Spironolactone 12.5 mg daily Dapagliflozin 10 mg daily   Has the patient been experiencing any side effects to the medications prescribed?  Yes - lightheadedness, dizziness with a recent lower BP readings. This is still occurring but much improved since discontinuing [name].   Does the patient have any problems obtaining medications due to transportation or finances?   No - has Pharmacist, community.  Understanding of regimen: good Understanding of indications: good Potential of compliance: good Patient understands to avoid NSAIDs. Patient understands to avoid decongestants.   Pertinent Lab Values: 12/11/19  Serum creatinine 0.83, BUN 17, Potassium 4.0, Sodium 138,  BNP 80 pg/mL   Vital Signs:  Weight: 127.4 lbs (last clinic weight: 129.6 lbs)  Blood pressure: 96/68   Heart rate: 88 bpm  Assessment: 1. CAD: S/p NSTEMI in 03/2016 with DEX to LCx and DES x 2 to RCA. Cardiolite in 02/2019 with EF <30%, inferior/inferolateral  infarct with peri-infarct ischemia. LHC in 08/2019 showed nonobstructive mild CAD. Rare atypical chest pain. - Continue atorvastatin and Zetia.Good lipids in 08/2019. - She recently got a fasting lipid panel drawn at her PCP. Results obtained over the phone - LDL 65, TC 147, TG 194. Will stay off [name] for now as her dizziness and loss of appetite have improved since its discontinuation.  - Continue ASA 81 and Plavix 75 mg daily for now.  She will continue Plavix as long as Dr. Estanislado Pandy feels it is needed post her cranial circulation intervention.   2. Chronic systolic CHF: Ischemic cardiomyopathy. Last echo in 05/2019 with EF 30-35%. RHC in 08/2019 with normal filling pressures and preserved cardiac output.  - NYHA class II. She is not volume overloaded on exam.  -Will make no medication changes today given occasional dizziness episodes and low BP in clinic.  - Continue carvedilol 6.25 mg BID -Continue Entresto24/26 mg BID   -Continue spironolactone12.5 mg daily.  - Continue dapagliflozin 10 mg daily. - Narrow QRS so not CRT candidate.  -Repeat ECHO scheduled for 02/18/20 with [name]    3. Carotid stenosis: Repeat carotid dopplers in06/2022.   4. Smoking: [name] strongly encouraged her to quit 11/12/19.She has cut back a lot.   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   Plan: 1) Medication changes: Based on clinical presentation, vital signs and recent labs will make no medication changes today given occasional dizziness episodes and low BP in clinic. 2) Follow-up: Repeat ECHO 02/18/20 with [name].   [name], PharmD, BCPS, BCCP, CPP Heart Failure Clinic Pharmacist [phone]

## 2020-01-06 ENCOUNTER — Other Ambulatory Visit: Payer: Self-pay

## 2020-01-06 MED ORDER — EZETIMIBE 10 MG PO TABS: 10.0000 mg | ORAL_TABLET | Freq: Every day | ORAL | 1 refills | Status: DC

## 2020-01-06 NOTE — Telephone Encounter (Signed)
Refilled zetia.

## 2020-01-07 ENCOUNTER — Encounter (HOSPITAL_COMMUNITY): Payer: Self-pay | Admitting: *Deleted

## 2020-01-07 ENCOUNTER — Other Ambulatory Visit: Payer: Self-pay

## 2020-01-07 ENCOUNTER — Emergency Department (HOSPITAL_COMMUNITY)
Admission: EM | Admit: 2020-01-07 | Discharge: 2020-01-07 | Disposition: A | Discharge status: Home / Self Care | Payer: PRIVATE HEALTH INSURANCE | Attending: Emergency Medicine | Admitting: Emergency Medicine

## 2020-01-07 DIAGNOSIS — F17210 Nicotine dependence, cigarettes, uncomplicated: Secondary | ICD-10-CM | POA: Diagnosis not present

## 2020-01-07 DIAGNOSIS — I110 Hypertensive heart disease with heart failure: Secondary | ICD-10-CM | POA: Insufficient documentation

## 2020-01-07 DIAGNOSIS — Z7901 Long term (current) use of anticoagulants: Secondary | ICD-10-CM | POA: Insufficient documentation

## 2020-01-07 DIAGNOSIS — Z7982 Long term (current) use of aspirin: Secondary | ICD-10-CM | POA: Diagnosis not present

## 2020-01-07 DIAGNOSIS — Z20822 Contact with and (suspected) exposure to covid-19: Secondary | ICD-10-CM | POA: Insufficient documentation

## 2020-01-07 DIAGNOSIS — R339 Retention of urine, unspecified: Secondary | ICD-10-CM | POA: Diagnosis present

## 2020-01-07 DIAGNOSIS — Z79899 Other long term (current) drug therapy: Secondary | ICD-10-CM | POA: Diagnosis not present

## 2020-01-07 DIAGNOSIS — I2510 Atherosclerotic heart disease of native coronary artery without angina pectoris: Secondary | ICD-10-CM | POA: Diagnosis not present

## 2020-01-07 DIAGNOSIS — I5042 Chronic combined systolic (congestive) and diastolic (congestive) heart failure: Secondary | ICD-10-CM | POA: Diagnosis not present

## 2020-01-07 DIAGNOSIS — Z85828 Personal history of other malignant neoplasm of skin: Secondary | ICD-10-CM | POA: Insufficient documentation

## 2020-01-07 DIAGNOSIS — Z955 Presence of coronary angioplasty implant and graft: Secondary | ICD-10-CM | POA: Insufficient documentation

## 2020-01-07 DIAGNOSIS — R899 Unspecified abnormal finding in specimens from other organs, systems and tissues: Secondary | ICD-10-CM

## 2020-01-07 HISTORY — DX: Cerebral aneurysm, nonruptured: I67.1

## 2020-01-07 LAB — URINALYSIS, ROUTINE W REFLEX MICROSCOPIC
Bacteria, UA: NONE SEEN
Bilirubin Urine: NEGATIVE
Glucose, UA: >=500 mg/dL — AB
Hgb urine dipstick: NEGATIVE
Ketones, ur: NEGATIVE mg/dL
Leukocytes,Ua: NEGATIVE
Nitrite: NEGATIVE
Protein, ur: NEGATIVE mg/dL
Specific Gravity, Urine: 1.017 (ref 1.005–1.030)
pH: 5 (ref 5.0–8.0)

## 2020-01-07 LAB — CBC
HCT: 36.1 % (ref 36.0–46.0)
Hemoglobin: 11.5 g/dL — ABNORMAL LOW (ref 12.0–15.0)
MCH: 29.4 pg (ref 26.0–34.0)
MCHC: 31.9 g/dL (ref 30.0–36.0)
MCV: 92.3 fL (ref 80.0–100.0)
Platelets: 417 10*3/uL — ABNORMAL HIGH (ref 150–400)
RBC: 3.91 MIL/uL (ref 3.87–5.11)
RDW: 15.3 % (ref 11.5–15.5)
WBC: 6.9 10*3/uL (ref 4.0–10.5)
nRBC: 0 % (ref 0.0–0.2)

## 2020-01-07 LAB — BASIC METABOLIC PANEL
Anion gap: 12 (ref 5–15)
BUN: 15 mg/dL (ref 6–20)
CO2: 22 mmol/L (ref 22–32)
Calcium: 9.4 mg/dL (ref 8.9–10.3)
Chloride: 106 mmol/L (ref 98–111)
Creatinine, Ser: 0.88 mg/dL (ref 0.44–1.00)
GFR calc Af Amer: >60 mL/min (ref >60)
GFR calc non Af Amer: >60 mL/min (ref >60)
Glucose, Bld: 92 mg/dL (ref 70–99)
Potassium: 4 mmol/L (ref 3.5–5.1)
Sodium: 140 mmol/L (ref 135–145)

## 2020-01-07 LAB — SARS CORONAVIRUS 2 BY RT PCR (HOSPITAL ORDER, PERFORMED IN ~~LOC~~ HOSPITAL LAB): SARS Coronavirus 2: NEGATIVE

## 2020-01-07 NOTE — ED Provider Notes (Signed)
[facility] EMERGENCY DEPARTMENT Provider Note   CSN: [id number] Arrival date & time: 01/07/20  1451     History Chief Complaint  Patient presents with  . Abnormal Lab    Erin [name] is a 59 y.o. female with a past medical history of CHF, previous brain aneurysm, CAD, heart failure, tobacco abuse who presents emergency department with chief complaint of urinary symptoms.  Patient states that she was seen at her doctor's office had a urinary tract infection.  The urine was sent for culture.  She was treated and completed a course of antibiotics.  She is unsure of what antibiotic she take took however she was called by her primary care doctor today and told she needed to come to the ER because of multiple resistances.  The patient continues to have suprapubic pain and hematuria.  She denies flank pain or fevers.  HPI     Past Medical History:  Diagnosis Date  . Anxiety state 03/25/2016  . Basal cell carcinoma of forehead   . Brain aneurysm   . CHF (congestive heart failure) ([place])   . Coronary artery disease    a. 03/11/16 PCI with DES-->Prox/Mid Cx;  b. 03/14/16 PCI with DES x2-->RCA, EF 30-35%.  . Essential hypertension   . GERD (gastroesophageal reflux disease)   . HFrEF (heart failure with reduced ejection fraction) ([place])    a. 10/2016 Echo: EF 35-40%, Gr1 DD, mild focal basal septal hypertrophy, basal inflat, mid inflat, basal antlat AK. Mid infept/inf/antlat, apical lateral sev HK. Mod MR. mildly reduced RV fxn. Mild TR.  [name] Kitchen History of pneumonia   . Hyperlipidemia   . IBS (irritable bowel syndrome)   . Ischemic cardiomyopathy    a. 10/2016 Echo: EF 35-40%, Gr1 DD.  [name] Kitchen Mitral regurgitation   . NSTEMI (non-ST elevated myocardial infarction) ([place]) 03/10/2016  . Pneumonia 03/2016  . Squamous cell cancer of skin of nose   . Thrombocytosis ([place]) 03/26/2016  . Tobacco abuse   . Trichimoniasis   . Wears dentures   . Wears glasses     Patient Active Problem List   Diagnosis  Date Noted  . Near syncope 12/10/2019  . Brain aneurysm 04/15/2019  . Dysphagia 02/27/2018  . Encounter for screening colonoscopy 02/27/2018  . History of Clostridium difficile infection 02/27/2018  . Frequent stools 12/20/2017  . Chronic combined systolic and diastolic CHF (congestive heart failure) ([place]) 06/20/2016  . Hypokalemia   . Acute CHF (congestive heart failure) ([place]) 05/16/2016  . Acute on chronic systolic CHF (congestive heart failure) ([place]) 05/16/2016  . Acute respiratory failure with hypoxia ([place])   . Thrombocytosis ([place]) 03/26/2016  . Cardiomyopathy, ischemic 03/25/2016  . Chronic combined systolic and diastolic heart failure ([place]) 03/25/2016  . Anxiety state 03/25/2016  . Troponin level elevated 03/25/2016  . CAD (coronary artery disease), native coronary artery 03/25/2016  . Normocytic anemia 03/25/2016  . SOB (shortness of breath) 03/24/2016  . Lightheadedness 03/17/2016  . Hypotension 03/17/2016  . Tobacco abuse 03/12/2016  . NSTEMI (non-ST elevated myocardial infarction) ([place]) 03/11/2016  . Pain in the chest   . Essential hypertension 09/06/2015  . Mixed hyperlipidemia 09/06/2015  . GERD (gastroesophageal reflux disease) 09/06/2015  . Chest pain 09/06/2015    Past Surgical History:  Procedure Laterality Date  . APPENDECTOMY    . BIOPSY  09/20/2018   Procedure: BIOPSY;  Surgeon: Daneil Dolin, MD;  [facility];  Service: Endoscopy;;  colon  . CARDIAC CATHETERIZATION N/A 03/11/2016  Procedure: Left Heart Cath and Coronary Angiography;  Surgeon: Leonie Man, MD;  Location: [place] [facility];  Service: Cardiovascular;  Laterality: N/A;  . CARDIAC CATHETERIZATION N/A 03/11/2016   Procedure: Coronary Stent Intervention;  Surgeon: Leonie Man, MD;  Location: [place] [facility];  Service: Cardiovascular;  Laterality: N/A;  . CARDIAC CATHETERIZATION N/A 03/14/2016   Procedure: Coronary Stent Intervention;  Surgeon: Peter M Martinique, MD;  Location:  [place] [facility];  Service: Cardiovascular;  Laterality: N/A;  . CHOLECYSTECTOMY OPEN  1984  . COLONOSCOPY WITH PROPOFOL N/A 09/20/2018   Procedure: COLONOSCOPY WITH PROPOFOL;  Surgeon: Daneil Dolin, MD;  [facility];  Service: Endoscopy;  Laterality: N/A;  10:30am  . CORONARY ANGIOPLASTY WITH STENT PLACEMENT  03/14/2016  . ESOPHAGOGASTRODUODENOSCOPY (EGD) WITH PROPOFOL N/A 09/20/2018   Procedure: ESOPHAGOGASTRODUODENOSCOPY (EGD) WITH PROPOFOL;  Surgeon: Daneil Dolin, MD;  [facility];  Service: Endoscopy;  Laterality: N/A;  . FINGER ARTHROPLASTY Left 05/14/2013   Procedure: LEFT THUMB CARPAL METACARPAL ARTHROPLASTY;  Surgeon: Tennis Must, MD;  Location: [place];  Service: Orthopedics;  Laterality: Left;  . IR ANGIO INTRA EXTRACRAN SEL COM CAROTID INNOMINATE BILAT MOD SED  01/05/2017  . IR ANGIO INTRA EXTRACRAN SEL COM CAROTID INNOMINATE BILAT MOD SED  03/19/2019  . IR ANGIO INTRA EXTRACRAN SEL INTERNAL CAROTID UNI L MOD SED  04/15/2019  . IR ANGIO VERTEBRAL SEL VERTEBRAL BILAT MOD SED  01/05/2017  . IR ANGIO VERTEBRAL SEL VERTEBRAL BILAT MOD SED  03/19/2019  . IR ANGIOGRAM FOLLOW UP STUDY  04/15/2019  . IR RADIOLOGIST EVAL & MGMT  12/30/2016  . IR TRANSCATH/EMBOLIZ  04/15/2019  . IR US GUIDE VASC ACCESS RIGHT  03/19/2019  . MALONEY DILATION N/A 09/20/2018   Procedure: Venia Minks DILATION;  Surgeon: Daneil Dolin, MD;  [facility];  Service: Endoscopy;  Laterality: N/A;  . RADIOLOGY WITH ANESTHESIA N/A 04/15/2019   Procedure: [employer];  Surgeon: Luanne Bras, MD;  Location: [place];  Service: Radiology;  Laterality: N/A;  . RIGHT/LEFT HEART CATH AND CORONARY ANGIOGRAPHY N/A 08/19/2019   Procedure: RIGHT/LEFT HEART CATH AND CORONARY ANGIOGRAPHY;  Surgeon: Larey Dresser, MD;  Location: [place] [facility];  Service: Cardiovascular;  Laterality: N/A;  . TUBAL LIGATION  1987  . VAGINAL HYSTERECTOMY  2009     OB History    Gravida  4    Para  4   Term  4   Preterm      AB      Living  4     SAB      TAB      Ectopic      Multiple      Live Births              Family History  Problem Relation Age of Onset  . Diabetes Father   . Hypertension Father   . CAD Father   . Colon polyps Father 60       pre-cancerous   . Stroke Father   . Dementia Father   . Stroke Mother   . Hypertension Mother   . Diabetes Mother   . Heart failure Other   . Breast cancer Maternal Grandmother   . Colon cancer Neg Hx     Social History   Tobacco Use  . Smoking status: Current Some Day Smoker    Packs/day: 0.10    Years: 15.00    Pack years:  1.50    Types: Cigarettes  . Smokeless tobacco: Never Used  . Tobacco comment: smokes a cigarette occasionally  Vaping Use  . Vaping Use: Never used  Substance Use Topics  . Alcohol use: Not Currently    Comment: occasionally  . Drug use: Not Currently    Types: Marijuana    Comment: former- 2017 last time    Home Medications Prior to Admission medications   Medication Sig Start Date End Date Taking? Authorizing Provider  acetaminophen (TYLENOL) 325 MG tablet Take 325 mg by mouth every 6 (six) hours as needed for mild pain or headache.     [provider]  ALPRAZolam Duanne Moron) 1 MG tablet Take 1-2 mg by mouth at bedtime.     [provider]  aspirin EC 81 MG tablet Take 81 mg by mouth every evening.     [provider]  atorvastatin (LIPITOR) 80 MG tablet TAKE 1 TABLET BY MOUTH ONCE DAILY AT  6  IN  THE  EVENING Patient taking differently: Take 80 mg by mouth daily. TAKE 1 TABLET BY MOUTH ONCE DAILY AT  6  IN  THE  EVENING 08/05/19   Strader, [place] M, PA-C  carvedilol (COREG) 6.25 MG tablet Take 1 tablet (6.25 mg total) by mouth 2 (two) times daily. 12/11/19   Johnson, Clanford L, MD  clopidogrel (PLAVIX) 75 MG tablet Take 1 tablet by mouth once daily Patient taking differently: Take 75 mg by mouth daily.  10/08/19   Herminio Commons, MD   dapagliflozin propanediol (FARXIGA) 10 MG TABS tablet Take 10 mg by mouth daily before breakfast. 09/26/19   Larey Dresser, MD  ezetimibe (ZETIA) 10 MG tablet Take 1 tablet (10 mg total) by mouth daily. 01/06/20   Strader, Fransisco Hertz, PA-C  gabapentin (NEURONTIN) 100 MG capsule Take 100 mg by mouth 2 (two) times daily.     [provider]  loperamide (IMODIUM) 2 MG capsule Take 2 mg by mouth 4 (four) times daily as needed for diarrhea or loose stools (ibs).  Patient not taking: Reported on 12/18/2019    [provider]  LORazepam (ATIVAN) 1 MG tablet Take 0.5-1 mg by mouth daily.  11/09/17   [provider]  Multiple Vitamins-Minerals (MULTIVITAMIN WITH MINERALS) tablet Take 1 tablet by mouth daily.    [provider]  nitroGLYCERIN (NITROSTAT) 0.4 MG SL tablet Place 1 tablet (0.4 mg total) under the tongue every 5 (five) minutes x 3 doses as needed for chest pain. Patient not taking: Reported on 11/12/2019 03/05/18   Heath Lark D, DO  omeprazole (PRILOSEC) 20 MG capsule Take 20 mg by mouth daily.     [provider]  rOPINIRole (REQUIP) 0.5 MG tablet Take 0.5 mg by mouth at bedtime.  11/13/17   [provider]  sacubitril-valsartan (ENTRESTO) 24-26 MG Take 1 tablet by mouth 2 (two) times daily. 12/11/19   Johnson, Clanford L, MD  spironolactone (ALDACTONE) 25 MG tablet Take 12.5 mg by mouth daily.     [provider]  zinc gluconate 50 MG tablet Take 50 mg by mouth daily.    [provider]    Allergies    Tape  Review of Systems   Review of Systems Ten systems reviewed and are negative for acute change, except as noted in the HPI.   Physical Exam Updated Vital Signs BP (!) 129/97 (BP Location: Right Arm)   Pulse 84   Temp 98.5 F (36.9 C) (Oral)  Resp 16   Ht 5' (1.524 m)   Wt 58.5 kg   SpO2 100%   BMI 25.19 kg/m   Physical Exam Vitals and nursing note reviewed.  Constitutional:      General: She is not in  acute distress.    Appearance: She is well-developed. She is not diaphoretic.  HENT:     Head: Normocephalic and atraumatic.  Eyes:     General: No scleral icterus.    Conjunctiva/sclera: Conjunctivae normal.  Cardiovascular:     Rate and Rhythm: Normal rate and regular rhythm.     Heart sounds: Normal heart sounds. No murmur heard.  No friction rub. No gallop.   Pulmonary:     Effort: Pulmonary effort is normal. No respiratory distress.     Breath sounds: Normal breath sounds.  Abdominal:     General: Bowel sounds are normal. There is no distension.     Palpations: Abdomen is soft. There is no mass.     Tenderness: There is abdominal tenderness in the suprapubic area. There is no left CVA tenderness or guarding.  Musculoskeletal:     Cervical back: Normal range of motion.  Skin:    General: Skin is warm and dry.  Neurological:     Mental Status: She is alert and oriented to person, place, and time.  Psychiatric:        Behavior: Behavior normal.     ED Results / Procedures / Treatments   Labs (all labs ordered are listed, but only abnormal results are displayed) Labs Reviewed  URINE CULTURE  SARS CORONAVIRUS 2 BY RT PCR (HOSPITAL ORDER, [facility])  CBC  BASIC METABOLIC PANEL  URINALYSIS, ROUTINE W REFLEX MICROSCOPIC      EKG None  Radiology No results found.  Procedures Procedures (including critical care time)  Medications Ordered in ED Medications - No data to display  ED Course  I have reviewed the triage vital signs and the nursing notes.  Pertinent labs & imaging results that were available during my care of the patient were reviewed by me and considered in my medical decision making (see chart for details).    MDM Rules/Calculators/A&P                         Patient here with UTI resistant to multiple antibiotics.  I have placed a copy of the patient's sensitivity report under the labs which shows that it is only  sensitive to ertapenem, imipenem, and meropenem.  We will repeat a urinalysis, urine culture, basic labs and Covid test.  Patient comfortable and hemodynamically stable at this time.   Patient's lab work has returned.  I have evaluated the lab work.  The patient has no white blood cell count elevation on CBC.  BMP without abnormality. Urine shows no evidence of infection; there are no leukocytes, bacteria, nitrites or hemoglobin noted.  She does have some moderate glucose urea.  Patient's Covid test is negative.  Given these findings I do not feel the patient needs admission for IV imipenem.  Will send her urine for culture.  The patient may follow-up with her PCP.  I have discussed and given written return precautions for the patient who understands and agrees with plan of care.  Appears otherwise appropriate for discharge at this time.  Erin [name] was evaluated in Emergency Department on 01/08/2020 for the symptoms described in the history of present illness. She was evaluated in  the context of the global COVID-19 pandemic, which necessitated consideration that the patient might be at risk for infection with the SARS-CoV-2 virus that causes COVID-19. Institutional protocols and algorithms that pertain to the evaluation of patients at risk for COVID-19 are in a state of rapid change based on information released by regulatory bodies including the CDC and federal and state organizations. These policies and algorithms were followed during the patient's care in the ED.    Final Clinical Impression(s) / ED Diagnoses Final diagnoses:  None    Rx / DC Orders ED Discharge Orders    None       Margarita Mail, PA-C 01/08/20 1222    Milton Ferguson, MD 01/09/20 [phone]

## 2020-01-07 NOTE — ED Triage Notes (Signed)
Pt sent here for UTI and culture came back, the antibiotics pt was on and finished need to be changed. Pt with urinary symptoms earlier.  Had some blood noted to urine earlier today.

## 2020-01-07 NOTE — Discharge Instructions (Signed)
Return if you develop severe belly or back pain, nausea, vomiting, or fevers.

## 2020-01-09 LAB — URINE CULTURE

## 2020-02-11 ENCOUNTER — Other Ambulatory Visit: Payer: Self-pay

## 2020-02-11 ENCOUNTER — Emergency Department (HOSPITAL_COMMUNITY)
Admission: EM | Admit: 2020-02-11 | Discharge: 2020-02-11 | Disposition: A | Discharge status: Home / Self Care | Payer: PRIVATE HEALTH INSURANCE | Attending: Emergency Medicine | Admitting: Emergency Medicine

## 2020-02-11 ENCOUNTER — Emergency Department (HOSPITAL_COMMUNITY): Payer: PRIVATE HEALTH INSURANCE

## 2020-02-11 ENCOUNTER — Encounter (HOSPITAL_COMMUNITY): Payer: Self-pay

## 2020-02-11 DIAGNOSIS — F17210 Nicotine dependence, cigarettes, uncomplicated: Secondary | ICD-10-CM | POA: Diagnosis not present

## 2020-02-11 DIAGNOSIS — Z20822 Contact with and (suspected) exposure to covid-19: Secondary | ICD-10-CM | POA: Insufficient documentation

## 2020-02-11 DIAGNOSIS — R109 Unspecified abdominal pain: Secondary | ICD-10-CM | POA: Diagnosis not present

## 2020-02-11 DIAGNOSIS — Z79899 Other long term (current) drug therapy: Secondary | ICD-10-CM | POA: Diagnosis not present

## 2020-02-11 DIAGNOSIS — I5042 Chronic combined systolic (congestive) and diastolic (congestive) heart failure: Secondary | ICD-10-CM | POA: Insufficient documentation

## 2020-02-11 DIAGNOSIS — I110 Hypertensive heart disease with heart failure: Secondary | ICD-10-CM | POA: Diagnosis not present

## 2020-02-11 DIAGNOSIS — I951 Orthostatic hypotension: Secondary | ICD-10-CM

## 2020-02-11 DIAGNOSIS — I2510 Atherosclerotic heart disease of native coronary artery without angina pectoris: Secondary | ICD-10-CM | POA: Diagnosis not present

## 2020-02-11 DIAGNOSIS — I429 Cardiomyopathy, unspecified: Secondary | ICD-10-CM | POA: Diagnosis not present

## 2020-02-11 DIAGNOSIS — R0602 Shortness of breath: Secondary | ICD-10-CM | POA: Diagnosis not present

## 2020-02-11 DIAGNOSIS — Z96692 Finger-joint replacement of left hand: Secondary | ICD-10-CM | POA: Insufficient documentation

## 2020-02-11 DIAGNOSIS — Z7982 Long term (current) use of aspirin: Secondary | ICD-10-CM | POA: Insufficient documentation

## 2020-02-11 DIAGNOSIS — R519 Headache, unspecified: Secondary | ICD-10-CM | POA: Diagnosis not present

## 2020-02-11 DIAGNOSIS — R0789 Other chest pain: Secondary | ICD-10-CM

## 2020-02-11 DIAGNOSIS — R059 Cough, unspecified: Secondary | ICD-10-CM | POA: Diagnosis not present

## 2020-02-11 DIAGNOSIS — Z955 Presence of coronary angioplasty implant and graft: Secondary | ICD-10-CM | POA: Diagnosis not present

## 2020-02-11 DIAGNOSIS — R112 Nausea with vomiting, unspecified: Secondary | ICD-10-CM | POA: Diagnosis not present

## 2020-02-11 DIAGNOSIS — R079 Chest pain, unspecified: Secondary | ICD-10-CM | POA: Diagnosis present

## 2020-02-11 LAB — COMPREHENSIVE METABOLIC PANEL
ALT: 17 U/L (ref 0–44)
AST: 20 U/L (ref 15–41)
Albumin: 3.5 g/dL (ref 3.5–5.0)
Alkaline Phosphatase: 58 U/L (ref 38–126)
Anion gap: 8 (ref 5–15)
BUN: 15 mg/dL (ref 6–20)
CO2: 24 mmol/L (ref 22–32)
Calcium: 9 mg/dL (ref 8.9–10.3)
Chloride: 106 mmol/L (ref 98–111)
Creatinine, Ser: 0.86 mg/dL (ref 0.44–1.00)
GFR calc non Af Amer: >60 mL/min (ref >60)
Glucose, Bld: 92 mg/dL (ref 70–99)
Potassium: 4 mmol/L (ref 3.5–5.1)
Sodium: 138 mmol/L (ref 135–145)
Total Bilirubin: 1.2 mg/dL (ref 0.3–1.2)
Total Protein: 6.1 g/dL — ABNORMAL LOW (ref 6.5–8.1)

## 2020-02-11 LAB — CBC WITH DIFFERENTIAL/PLATELET
Abs Immature Granulocytes: 0.04 10*3/uL (ref 0.00–0.07)
Basophils Absolute: 0 10*3/uL (ref 0.0–0.1)
Basophils Relative: 0 %
Eosinophils Absolute: 0.1 10*3/uL (ref 0.0–0.5)
Eosinophils Relative: 1 %
HCT: 36.1 % (ref 36.0–46.0)
Hemoglobin: 11.7 g/dL — ABNORMAL LOW (ref 12.0–15.0)
Immature Granulocytes: 1 %
Lymphocytes Relative: 23 %
Lymphs Abs: 1.9 10*3/uL (ref 0.7–4.0)
MCH: 29.2 pg (ref 26.0–34.0)
MCHC: 32.4 g/dL (ref 30.0–36.0)
MCV: 90 fL (ref 80.0–100.0)
Monocytes Absolute: 0.7 10*3/uL (ref 0.1–1.0)
Monocytes Relative: 8 %
Neutro Abs: 5.5 10*3/uL (ref 1.7–7.7)
Neutrophils Relative %: 67 %
Platelets: 398 10*3/uL (ref 150–400)
RBC: 4.01 MIL/uL (ref 3.87–5.11)
RDW: 14.7 % (ref 11.5–15.5)
WBC: 8.1 10*3/uL (ref 4.0–10.5)
nRBC: 0 % (ref 0.0–0.2)

## 2020-02-11 LAB — RESPIRATORY PANEL BY RT PCR (FLU A&B, COVID)
Influenza A by PCR: NEGATIVE
Influenza B by PCR: NEGATIVE
SARS Coronavirus 2 by RT PCR: NEGATIVE

## 2020-02-11 LAB — LACTIC ACID, PLASMA
Lactic Acid, Venous: 1 mmol/L (ref 0.5–1.9)
Lactic Acid, Venous: 0.8 mmol/L (ref 0.5–1.9)

## 2020-02-11 LAB — C-REACTIVE PROTEIN: CRP: 0.6 mg/dL (ref <1.0)

## 2020-02-11 LAB — MAGNESIUM: Magnesium: 2.5 mg/dL — ABNORMAL HIGH (ref 1.7–2.4)

## 2020-02-11 LAB — ETHANOL: Alcohol, Ethyl (B): <10 mg/dL (ref <10)

## 2020-02-11 LAB — LIPASE, BLOOD: Lipase: 51 U/L (ref 11–51)

## 2020-02-11 LAB — TROPONIN I (HIGH SENSITIVITY)
Troponin I (High Sensitivity): 6 ng/L (ref <18)
Troponin I (High Sensitivity): 6 ng/L (ref <18)

## 2020-02-11 LAB — BRAIN NATRIURETIC PEPTIDE: B Natriuretic Peptide: 134 pg/mL — ABNORMAL HIGH (ref 0.0–100.0)

## 2020-02-11 LAB — SEDIMENTATION RATE: Sed Rate: 14 mm/hr (ref 0–22)

## 2020-02-11 MED ORDER — ONDANSETRON HCL 4 MG/2ML IJ SOLN
4.0000 mg | Freq: Once | INTRAMUSCULAR | Status: AC
  Administered 2020-02-11: 4 mg via INTRAVENOUS
  Filled 2020-02-11: qty 2

## 2020-02-11 MED ORDER — SODIUM CHLORIDE 0.9 % IV BOLUS
500.0000 mL | Freq: Once | INTRAVENOUS | Status: AC
  Administered 2020-02-11: 500 mL via INTRAVENOUS

## 2020-02-11 MED ORDER — CARVEDILOL 6.25 MG PO TABS: 3.1250 mg | ORAL_TABLET | Freq: Two times a day (BID) | ORAL | 1 refills | Status: DC

## 2020-02-11 MED ORDER — SODIUM CHLORIDE 0.9 % IV BOLUS
1000.0000 mL | Freq: Once | INTRAVENOUS | Status: AC
  Administered 2020-02-11: 1000 mL via INTRAVENOUS

## 2020-02-11 MED ORDER — SPIRONOLACTONE 25 MG PO TABS: 12.5000 mg | ORAL_TABLET | Freq: Every day | ORAL | Status: DC

## 2020-02-11 NOTE — Discharge Instructions (Signed)
Chest Wall Pain Chest wall pain is pain in or around the bones and muscles of your chest. Chest wall pain may be caused by:  An injury.  Coughing a lot.  Using your chest and arm muscles too much. Sometimes, the cause may not be known. This pain may take a few weeks or longer to get better. Follow these instructions at home: Managing pain, stiffness, and swelling If told, put ice on the painful area:  Put ice in a plastic bag.  Place a towel between your skin and the bag.  Leave the ice on for 20 minutes, 2-3 times a day.  Activity  Rest as told by your doctor.  Avoid doing things that cause pain. This includes lifting heavy items.  Ask your doctor what activities are safe for you. General instructions   Take over-the-counter and prescription medicines only as told by your doctor.  Do not use any products that contain nicotine or tobacco, such as cigarettes, e-cigarettes, and chewing tobacco. If you need help quitting, ask your doctor.  Keep all follow-up visits as told by your doctor. This is important. Contact a doctor if:  You have a fever.  Your chest pain gets worse.  You have new symptoms. Get help right away if:  You feel sick to your stomach (nauseous) or you throw up (vomit).  You feel sweaty or light-headed.  You have a cough with mucus from your lungs (sputum) or you cough up blood.  You are short of breath. These symptoms may be an emergency. Do not wait to see if the symptoms will go away. Get medical help right away. Call your local emergency services (911 in the [place] Do not drive yourself to the hospital. Summary  Chest wall pain is pain in or around the bones and muscles of your chest.  It may be treated with ice, rest, and medicines. Your condition may also get better if you avoid doing things that cause pain.  Contact a doctor if you have a fever, chest pain that gets worse, or new symptoms.  Get help right away if you feel light-headed  or you get short of breath. These symptoms may be an emergency. This information is not intended to replace advice given to you by your health care provider. Make sure you discuss any questions you have with your health care provider. Document Revised: 10/26/2017 Document Reviewed: 10/26/2017 Elsevier Patient Education  [place].  Hypotension As your heart beats, it forces blood through your body. This force is called blood pressure. If you have hypotension, you have low blood pressure. When your blood pressure is too low, you may not get enough blood to your brain or other parts of your body. This may cause you to feel weak, light-headed, have a fast heartbeat, or even pass out (faint). Low blood pressure may be harmless, or it may cause serious problems. What are the causes?  Blood loss.  Not enough water in the body (dehydration).  Heart problems.  Hormone problems.  Pregnancy.  A very bad infection.  Not having enough of certain nutrients.  Very bad allergic reactions.  Certain medicines. What increases the risk?  Age. The risk increases as you get older.  Conditions that affect the heart or the brain and spinal cord (central nervous system).  Taking certain medicines.  Being pregnant. What are the signs or symptoms?  Feeling: ? Weak. ? Light-headed. ? Dizzy. ? Tired (fatigued).  Blurred vision.  Fast heartbeat.  Passing out,  in very bad cases. How is this treated?  Changing your diet. This may involve eating more salt (sodium) or drinking more water.  Taking medicines to raise your blood pressure.  Changing how much you take (the dosage) of some of your medicines.  Wearing compression stockings. These stockings help to prevent blood clots and reduce swelling in your legs. In some cases, you may need to go to the hospital for:  Fluid replacement. This means you will receive fluids through an IV tube.  Blood replacement. This means you will  receive donated blood through an IV tube (transfusion).  Treating an infection or heart problems, if this applies.  Monitoring. You may need to be monitored while medicines that you are taking wear off. Follow these instructions at home: Eating and drinking   Drink enough fluids to keep your pee (urine) pale yellow.  Eat a healthy diet. Follow instructions from your doctor about what you can eat or drink. A healthy diet includes: ? Fresh fruits and vegetables. ? Whole grains. ? Low-fat (lean) meats. ? Low-fat dairy products.  Eat extra salt only as told. Do not add extra salt to your diet unless your doctor tells you to.  Eat small meals often.  Avoid standing up quickly after you eat. Medicines  Take over-the-counter and prescription medicines only as told by your doctor. ? Follow instructions from your doctor about changing how much you take of your medicines, if this applies. ? Do not stop or change any of your medicines on your own. General instructions   Wear compression stockings as told by your doctor.  Get up slowly from lying down or sitting.  Avoid hot showers and a lot of heat as told by your doctor.  Return to your normal activities as told by your doctor. Ask what activities are safe for you.  Do not use any products that contain nicotine or tobacco, such as cigarettes, e-cigarettes, and chewing tobacco. If you need help quitting, ask your doctor.  Keep all follow-up visits as told by your doctor. This is important. Contact a doctor if:  You throw up (vomit).  You have watery poop (diarrhea).  You have a fever for more than 2-3 days.  You feel more thirsty than normal.  You feel weak and tired. Get help right away if:  You have chest pain.  You have a fast or uneven heartbeat.  You lose feeling (have numbness) in any part of your body.  You cannot move your arms or your legs.  You have trouble talking.  You get sweaty or feel  light-headed.  You pass out.  You have trouble breathing.  You have trouble staying awake.  You feel mixed up (confused). Summary  Hypotension is also called low blood pressure. It is when the force of blood pumping through your arteries is too weak.  Hypotension may be harmless, or it may cause serious problems.  Treatment may include changing your diet and medicines, and wearing compression stockings.  In very bad cases, you may need to go to the hospital. This information is not intended to replace advice given to you by your health care provider. Make sure you discuss any questions you have with your health care provider. Document Revised: 10/19/2017 Document Reviewed: 10/19/2017 Elsevier Patient Education  [place].

## 2020-02-11 NOTE — ED Provider Notes (Signed)
[facility] EMERGENCY DEPARTMENT Provider Note   CSN: [id number] Arrival date & time: 02/11/20  1941     History Chief Complaint  Patient presents with  . Chest Pain    Erin [name] is a 59 y.o. female with past medical history of CHF, CAD, brain aneurysm, tobacco abuse who presents to the ED for chief complaint of chest pain, cough, nausea, vomiting, sore throat, shortness of breath that started 3 days ago.  Patient states that her upper respiratory tract symptom started first and thinks that her cough causing her chest pain.  Her family including her children are also experiencing upper respiratory tract symptoms and nausea.  Patient states that her chest pain locates at her left chest and left breast and does not radiate.  Described pain as aching.  Pain occurs at rest.  Rate 8 out of 10.  Relieved with lying on her left side, Tylenol, and rest.  Worse with coughing.  Patient does have short of breath as baseline with exertion but she states she feels more short of breath than usual.  Patient denies lower extremity swelling, dysuria or flank pain or rash.  Associated symptoms include abdominal pain, dizziness, headache.  Due to the symptoms, she has not been able to eat and drink well in the last few days.  Per chart review patient has history of CHF and latest echo shows EF 30 to 35% with hypoglobal kinesis.  She had a heart cath 08/2019 which shows patent stents and no CAD blockage, thought to be vasospasm.  The history is provided by the patient.  Chest Pain Pain location:  L chest Pain quality: aching   Radiates to: left breast. Pain severity:  Severe Onset quality:  Sudden Duration:  2 days Timing:  Constant Progression:  Worsening Chronicity:  New Context: at rest   Context comment:  After coughing Relieved by: lay on left side and tylenol. Worsened by:  Coughing Ineffective treatments:  Rest Associated symptoms: abdominal pain, cough, diaphoresis, dizziness, headache,  nausea, shortness of breath and vomiting   Risk factors: coronary artery disease        Past Medical History:  Diagnosis Date  . Anxiety state 03/25/2016  . Basal cell carcinoma of forehead   . Brain aneurysm   . CHF (congestive heart failure) ([place])   . Coronary artery disease    a. 03/11/16 PCI with DES-->Prox/Mid Cx;  b. 03/14/16 PCI with DES x2-->RCA, EF 30-35%.  . Essential hypertension   . GERD (gastroesophageal reflux disease)   . HFrEF (heart failure with reduced ejection fraction) ([place])    a. 10/2016 Echo: EF 35-40%, Gr1 DD, mild focal basal septal hypertrophy, basal inflat, mid inflat, basal antlat AK. Mid infept/inf/antlat, apical lateral sev HK. Mod MR. mildly reduced RV fxn. Mild TR.  [name] Kitchen History of pneumonia   . Hyperlipidemia   . IBS (irritable bowel syndrome)   . Ischemic cardiomyopathy    a. 10/2016 Echo: EF 35-40%, Gr1 DD.  [name] Kitchen Mitral regurgitation   . NSTEMI (non-ST elevated myocardial infarction) ([place]) 03/10/2016  . Pneumonia 03/2016  . Squamous cell cancer of skin of nose   . Thrombocytosis 03/26/2016  . Tobacco abuse   . Trichimoniasis   . Wears dentures   . Wears glasses     Patient Active Problem List   Diagnosis Date Noted  . Cough 02/11/2020  . Near syncope 12/10/2019  . Brain aneurysm 04/15/2019  . Dysphagia 02/27/2018  . Encounter for screening colonoscopy 02/27/2018  . History  of Clostridium difficile infection 02/27/2018  . Frequent stools 12/20/2017  . Chronic combined systolic and diastolic CHF (congestive heart failure) ([place]) 06/20/2016  . Hypokalemia   . Acute CHF (congestive heart failure) ([place]) 05/16/2016  . Acute on chronic systolic CHF (congestive heart failure) ([place]) 05/16/2016  . Acute respiratory failure with hypoxia ([place])   . Thrombocytosis 03/26/2016  . Cardiomyopathy, ischemic 03/25/2016  . Chronic combined systolic and diastolic heart failure ([place]) 03/25/2016  . Anxiety state 03/25/2016  . Troponin level elevated 03/25/2016  .  CAD (coronary artery disease), native coronary artery 03/25/2016  . Normocytic anemia 03/25/2016  . SOB (shortness of breath) 03/24/2016  . Lightheadedness 03/17/2016  . Hypotension 03/17/2016  . Tobacco abuse 03/12/2016  . NSTEMI (non-ST elevated myocardial infarction) ([place]) 03/11/2016  . Atypical chest pain   . Essential hypertension 09/06/2015  . Mixed hyperlipidemia 09/06/2015  . GERD (gastroesophageal reflux disease) 09/06/2015  . Chest pain 09/06/2015    Past Surgical History:  Procedure Laterality Date  . APPENDECTOMY    . BIOPSY  09/20/2018   Procedure: BIOPSY;  Surgeon: Daneil Dolin, MD;  [facility];  Service: Endoscopy;;  colon  . CARDIAC CATHETERIZATION N/A 03/11/2016   Procedure: Left Heart Cath and Coronary Angiography;  Surgeon: Leonie Man, MD;  Location: [place] [facility];  Service: Cardiovascular;  Laterality: N/A;  . CARDIAC CATHETERIZATION N/A 03/11/2016   Procedure: Coronary Stent Intervention;  Surgeon: Leonie Man, MD;  Location: [place] [facility];  Service: Cardiovascular;  Laterality: N/A;  . CARDIAC CATHETERIZATION N/A 03/14/2016   Procedure: Coronary Stent Intervention;  Surgeon: Peter M Martinique, MD;  Location: [place] [facility];  Service: Cardiovascular;  Laterality: N/A;  . CHOLECYSTECTOMY OPEN  1984  . COLONOSCOPY WITH PROPOFOL N/A 09/20/2018   Procedure: COLONOSCOPY WITH PROPOFOL;  Surgeon: Daneil Dolin, MD;  [facility];  Service: Endoscopy;  Laterality: N/A;  10:30am  . CORONARY ANGIOPLASTY WITH STENT PLACEMENT  03/14/2016  . ESOPHAGOGASTRODUODENOSCOPY (EGD) WITH PROPOFOL N/A 09/20/2018   Procedure: ESOPHAGOGASTRODUODENOSCOPY (EGD) WITH PROPOFOL;  Surgeon: Daneil Dolin, MD;  [facility];  Service: Endoscopy;  Laterality: N/A;  . FINGER ARTHROPLASTY Left 05/14/2013   Procedure: LEFT THUMB CARPAL METACARPAL ARTHROPLASTY;  Surgeon: Tennis Must, MD;  Location: [place];  Service:  Orthopedics;  Laterality: Left;  . IR ANGIO INTRA EXTRACRAN SEL COM CAROTID INNOMINATE BILAT MOD SED  01/05/2017  . IR ANGIO INTRA EXTRACRAN SEL COM CAROTID INNOMINATE BILAT MOD SED  03/19/2019  . IR ANGIO INTRA EXTRACRAN SEL INTERNAL CAROTID UNI L MOD SED  04/15/2019  . IR ANGIO VERTEBRAL SEL VERTEBRAL BILAT MOD SED  01/05/2017  . IR ANGIO VERTEBRAL SEL VERTEBRAL BILAT MOD SED  03/19/2019  . IR ANGIOGRAM FOLLOW UP STUDY  04/15/2019  . IR RADIOLOGIST EVAL & MGMT  12/30/2016  . IR TRANSCATH/EMBOLIZ  04/15/2019  . IR US GUIDE VASC ACCESS RIGHT  03/19/2019  . MALONEY DILATION N/A 09/20/2018   Procedure: Venia Minks DILATION;  Surgeon: Daneil Dolin, MD;  [facility];  Service: Endoscopy;  Laterality: N/A;  . RADIOLOGY WITH ANESTHESIA N/A 04/15/2019   Procedure: [employer];  Surgeon: Luanne Bras, MD;  Location: [place];  Service: Radiology;  Laterality: N/A;  . RIGHT/LEFT HEART CATH AND CORONARY ANGIOGRAPHY N/A 08/19/2019   Procedure: RIGHT/LEFT HEART CATH AND CORONARY ANGIOGRAPHY;  Surgeon: Larey Dresser, MD;  Location: [place] [facility];  Service: Cardiovascular;  Laterality: N/A;  .  TUBAL LIGATION  1987  . VAGINAL HYSTERECTOMY  2009     OB History    Gravida  4   Para  4   Term  4   Preterm      AB      Living  4     SAB      TAB      Ectopic      Multiple      Live Births              Family History  Problem Relation Age of Onset  . Diabetes Father   . Hypertension Father   . CAD Father   . Colon polyps Father 60       pre-cancerous   . Stroke Father   . Dementia Father   . Stroke Mother   . Hypertension Mother   . Diabetes Mother   . Heart failure Other   . Breast cancer Maternal Grandmother   . Colon cancer Neg Hx     Social History   Tobacco Use  . Smoking status: Current Some Day Smoker    Packs/day: 0.10    Years: 15.00    Pack years: 1.50    Types: Cigarettes  . Smokeless tobacco: Never Used  . Tobacco comment: smokes a  cigarette occasionally  Vaping Use  . Vaping Use: Never used  Substance Use Topics  . Alcohol use: Not Currently    Comment: occasionally  . Drug use: Not Currently    Types: Marijuana    Comment: former- 2017 last time    Home Medications Prior to Admission medications   Medication Sig Start Date End Date Taking? Authorizing Provider  acetaminophen (TYLENOL) 325 MG tablet Take 325 mg by mouth every 6 (six) hours as needed for mild pain or headache.    Yes [provider]  ALPRAZolam Duanne Moron) 1 MG tablet Take 1-2 mg by mouth at bedtime.    Yes [provider]  aspirin EC 81 MG tablet Take 81 mg by mouth every evening.    Yes [provider]  atorvastatin (LIPITOR) 80 MG tablet TAKE 1 TABLET BY MOUTH ONCE DAILY AT  6  IN  THE  EVENING Patient taking differently: Take 80 mg by mouth daily.  08/05/19  Yes Strader, [place] M, PA-C  carvedilol (COREG) 6.25 MG tablet Take 1 tablet (6.25 mg total) by mouth 2 (two) times daily. 12/11/19  Yes Johnson, Clanford L, MD  clopidogrel (PLAVIX) 75 MG tablet Take 1 tablet by mouth once daily Patient taking differently: Take 75 mg by mouth daily.  10/08/19  Yes Herminio Commons, MD  dapagliflozin propanediol (FARXIGA) 10 MG TABS tablet Take 10 mg by mouth daily before breakfast. 09/26/19  Yes Larey Dresser, MD  ezetimibe (ZETIA) 10 MG tablet Take 1 tablet (10 mg total) by mouth daily. 01/06/20  Yes Strader, [place] M, PA-C  gabapentin (NEURONTIN) 100 MG capsule Take 100 mg by mouth 2 (two) times daily.    Yes [provider]  loperamide (IMODIUM) 2 MG capsule Take 2 mg by mouth 4 (four) times daily as needed for diarrhea or loose stools (ibs).    Yes [provider]  LORazepam (ATIVAN) 1 MG tablet Take 0.5-1 mg by mouth daily.  11/09/17  Yes [provider]  Multiple Vitamins-Minerals (MULTIVITAMIN WITH MINERALS) tablet Take 1 tablet by mouth daily.   Yes [provider]  omeprazole (PRILOSEC)  20 MG capsule Take 20 mg by mouth  daily.    Yes [provider]  rOPINIRole (REQUIP) 0.5 MG tablet Take 0.5 mg by mouth at bedtime.  11/13/17  Yes [provider]  sacubitril-valsartan (ENTRESTO) 24-26 MG Take 1 tablet by mouth 2 (two) times daily. 12/11/19  Yes Johnson, Clanford L, MD  spironolactone (ALDACTONE) 25 MG tablet Take 12.5 mg by mouth daily.    Yes [provider]  zinc gluconate 50 MG tablet Take 50 mg by mouth daily.   Yes [provider]  nitroGLYCERIN (NITROSTAT) 0.4 MG SL tablet Place 1 tablet (0.4 mg total) under the tongue every 5 (five) minutes x 3 doses as needed for chest pain. Patient not taking: Reported on 11/12/2019 03/05/18   Heath Lark D, DO    Allergies    Tape  Review of Systems   Review of Systems  Constitutional: Positive for diaphoresis.  HENT: Positive for sore throat.   Respiratory: Positive for cough and shortness of breath.   Cardiovascular: Positive for chest pain.  Gastrointestinal: Positive for abdominal pain, diarrhea, nausea and vomiting.  Genitourinary: Negative for dysuria and flank pain.  Musculoskeletal: Positive for myalgias.  Skin: Negative for rash.  Neurological: Positive for dizziness and headaches.    Physical Exam Updated Vital Signs BP 102/68   Pulse 71   Temp 97.8 F (36.6 C) (Oral)   Resp 17   Ht 5' (1.524 m)   Wt 58.5 kg   SpO2 100%   BMI 25.19 kg/m   Physical Exam Constitutional:      General: She is not in acute distress. HENT:     Head: Normocephalic.  Cardiovascular:     Rate and Rhythm: Normal rate and regular rhythm.     Heart sounds: No murmur heard.   Pulmonary:     Effort: No respiratory distress.     Breath sounds: Normal breath sounds. No wheezing.  Abdominal:     General: There is no distension.     Palpations: Abdomen is soft.     Comments: Negative Murphy sign. Mild tenderness to palpation at the left lower quadrant.  No epigastric tenderness to palpation.   Musculoskeletal:        General: No tenderness.     Cervical back: Normal range of motion.     Comments: Trace edema bilateral lower extremities  Skin:    General: Skin is warm.  Neurological:     Mental Status: She is alert.  Psychiatric:        Mood and Affect: Mood normal.     ED Results / Procedures / Treatments   Labs (all labs ordered are listed, but only abnormal results are displayed) Labs Reviewed  COMPREHENSIVE METABOLIC PANEL - Abnormal; Notable for the following components:      Result Value   Total Protein 6.1 (*)    All other components within normal limits  CBC WITH DIFFERENTIAL/PLATELET - Abnormal; Notable for the following components:   Hemoglobin 11.7 (*)    All other components within normal limits  BRAIN NATRIURETIC PEPTIDE - Abnormal; Notable for the following components:   B Natriuretic Peptide 134.0 (*)    All other components within normal limits  MAGNESIUM - Abnormal; Notable for the following components:   Magnesium 2.5 (*)    All other components within normal limits  RESPIRATORY PANEL BY RT PCR (FLU A&B, COVID)  ETHANOL  LIPASE, BLOOD  LACTIC ACID, PLASMA  SEDIMENTATION RATE  C-REACTIVE PROTEIN  LACTIC ACID, PLASMA  TROPONIN I (HIGH SENSITIVITY)  TROPONIN  I (HIGH SENSITIVITY)    EKG EKG Interpretation  Date/Time:  Tuesday February 11 2020 07:07:46 EDT Ventricular Rate:  78 PR Interval:    QRS Duration: 92 QT Interval:  388 QTC Calculation: 442 R Axis:   7 Text Interpretation: Sinus rhythm Borderline T abnormalities, inferior leads Baseline wander Abnormal ECG Confirmed by Carmin Muskrat [phone]) on 02/11/2020 7:17:14 AM   Radiology DG Chest Port 1 View  Result Date: 02/11/2020 CLINICAL DATA:  Shortness of breath. EXAM: PORTABLE CHEST 1 VIEW COMPARISON:  10/22/2019 FINDINGS: 0759 hours. Lungs are hyperexpanded. The lungs are clear without focal pneumonia, edema, pneumothorax or pleural effusion. Cardiopericardial silhouette is at  upper limits of normal for size. The visualized bony structures of the thorax show no acute abnormality. Telemetry leads overlie the chest. IMPRESSION: Hyperexpansion without acute cardiopulmonary findings. Electronically Signed   By: Misty Stanley M.D.   On: 02/11/2020 08:08    Procedures Procedures (including critical care time)  Medications Ordered in ED Medications  ondansetron (ZOFRAN) injection 4 mg (4 mg Intravenous Given 02/11/20 0815)  sodium chloride 0.9 % bolus 500 mL (500 mLs Intravenous New Bag/Given 02/11/20 0815)    ED Course  I have reviewed the triage vital signs and the nursing notes.  Pertinent labs & imaging results that were available during my care of the patient were reviewed by me and considered in my medical decision making (see chart for details).  Patient seen and examined.  She complains of left chest pain exacerbated by coughing, nausea, vomiting, sore throat and shortness of breath.  Patient does have history of CHF with EF 30-35%.  Lung sounds clear on physical exam and only trace edema of lower extremities.  No JVD observed.  Blood pressure in the room 86/53 MAP 63.  Differentials include COVID-19 infection, ACS, CHF exacerbation, PE, pericarditis, sepsis.  Will obtain chest x-ray, CBC, CMP, lipase, troponin x2, lactic acid, sed rate, CRP, BMP, and Covid swab test.  EKG shows sinus rhythm.  BP 102/68   Pulse 71   Temp 97.8 F (36.6 C) (Oral)   Resp 17   Ht 5' (1.524 m)   Wt 58.5 kg   SpO2 100%   BMI 25.19 kg/m   Her labs came back unremarkable.  Troponins negative.  Chest x-ray showed hyperinflation of lungs with no acute changes.  Covid came back negative.  This is likely COPD exacerbation or viral gastroenteritis. Due to heart pathway score of 4 and history of CAD and CHF, patient is recommended to admit for observation.  Hospitalist contacted.   MDM Rules/Calculators/A&P                          Patient presents to the ED for atypical chest pain  that exacerbated by coughing.  She also have symptoms of nausea, vomiting, sore throat, shortness of breath and diarrhea.  Her family also experienced the same symptoms.  Covid test came back negative.  Other labs are also unremarkable.  Physical exams are benign and negative for lung crackles, LE edema or JVD.  EKG shows normal sinus rhythm.  Chest x-ray is negative for acute pulmonary changes.  This is likely COPD exacerbation or viral gastroenteritis.  For atypical chest pain, her heart pathway score is 4.  Patient is recommended to admit for observation.  [name] was evaluated in Emergency Department on 02/11/2020 for the symptoms described in the history of present illness. She was evaluated in the context of  the global COVID-19 pandemic, which necessitated consideration that the patient might be at risk for infection with the SARS-CoV-2 virus that causes COVID-19. Institutional protocols and algorithms that pertain to the evaluation of patients at risk for COVID-19 are in a state of rapid change based on information released by regulatory bodies including the CDC and federal and state organizations. These policies and algorithms were followed during the patient's care in the ED.  Final Clinical Impression(s) / ED Diagnoses Final diagnoses:  Atypical chest pain  Cough    Rx / DC Orders ED Discharge Orders    None       Gaylan Gerold, DO 02/11/20 1213    Carmin Muskrat, MD 02/11/20 1556

## 2020-02-11 NOTE — Consult Note (Signed)
Medical Consultation   Erin [name]  XNT:[id number]  DOB: [birth year]  DOA: 02/11/2020  PCP: [name], MD   Outpatient Specialists: CHF clinic: Dr. Loralie Champagne   Requesting physician: Dr. Alfonse Spruce, [place]  Reason for consultation: Atypical chest pain   History of Present Illness: Erin [name] is an 59 y.o. female with a history of coronary artery disease, cardiomyopathy with ejection fraction of 30 to 35%, presents to the emergency room with complaints of shortness of breath and pleuritic chest pain.  She reports that for the past 2 to 3 days, she has had worsening cough and pleuritic chest pain occurring in her left upper quadrant/left rib line.  She has associated shortness of breath on exertion.  She feels congested, has sore throat.  Multiple other family members in her household have similar symptoms.  She also has some dizziness and lightheadedness.  She reports that these are chronic symptoms and were actually present during her hospitalization in 12/2019.  She was evaluated the emergency room where EKG did not show any acute changes.  Cardiac enzymes were negative.  Basic labs were also unrevealing.  Vitals showed that blood pressure within the 80s to 90s.  She did receive IV fluid hydration with improvement of blood pressures.  Chest x-ray did not show any pneumonia and Covid test was negative.  She was referred for admission.     Review of Systems:  ROS As per HPI otherwise 10 point review of systems negative.     Past Medical History: Past Medical History:  Diagnosis Date  . Anxiety state 03/25/2016  . Basal cell carcinoma of forehead   . Brain aneurysm   . CHF (congestive heart failure) ([place])   . Coronary artery disease    a. 03/11/16 PCI with DES-->Prox/Mid Cx;  b. 03/14/16 PCI with DES x2-->RCA, EF 30-35%.  . Essential hypertension   . GERD (gastroesophageal reflux disease)   . HFrEF (heart failure with reduced ejection fraction) ([place])    a.  10/2016 Echo: EF 35-40%, Gr1 DD, mild focal basal septal hypertrophy, basal inflat, mid inflat, basal antlat AK. Mid infept/inf/antlat, apical lateral sev HK. Mod MR. mildly reduced RV fxn. Mild TR.  [name] Kitchen History of pneumonia   . Hyperlipidemia   . IBS (irritable bowel syndrome)   . Ischemic cardiomyopathy    a. 10/2016 Echo: EF 35-40%, Gr1 DD.  [name] Kitchen Mitral regurgitation   . NSTEMI (non-ST elevated myocardial infarction) ([place]) 03/10/2016  . Pneumonia 03/2016  . Squamous cell cancer of skin of nose   . Thrombocytosis 03/26/2016  . Tobacco abuse   . Trichimoniasis   . Wears dentures   . Wears glasses     Past Surgical History: Past Surgical History:  Procedure Laterality Date  . APPENDECTOMY    . BIOPSY  09/20/2018   Procedure: BIOPSY;  Surgeon: Daneil Dolin, MD;  [facility];  Service: Endoscopy;;  colon  . CARDIAC CATHETERIZATION N/A 03/11/2016   Procedure: Left Heart Cath and Coronary Angiography;  Surgeon: Leonie Man, MD;  Location: [facility];  Service: Cardiovascular;  Laterality: N/A;  . CARDIAC CATHETERIZATION N/A 03/11/2016   Procedure: Coronary Stent Intervention;  Surgeon: Leonie Man, MD;  Location: [facility];  Service: Cardiovascular;  Laterality: N/A;  . CARDIAC CATHETERIZATION N/A 03/14/2016   Procedure: Coronary Stent Intervention;  Surgeon: Peter M Martinique, MD;  Location: [facility];  Service: Cardiovascular;  Laterality: N/A;  . CHOLECYSTECTOMY OPEN  1984  . COLONOSCOPY WITH PROPOFOL N/A 09/20/2018   Procedure: COLONOSCOPY WITH PROPOFOL;  Surgeon: Daneil Dolin, MD;  [facility];  Service: Endoscopy;  Laterality: N/A;  10:30am  . CORONARY ANGIOPLASTY WITH STENT PLACEMENT  03/14/2016  . ESOPHAGOGASTRODUODENOSCOPY (EGD) WITH PROPOFOL N/A 09/20/2018   Procedure: ESOPHAGOGASTRODUODENOSCOPY (EGD) WITH PROPOFOL;  Surgeon: Daneil Dolin, MD;  [facility];  Service: Endoscopy;  Laterality: N/A;  . FINGER  ARTHROPLASTY Left 05/14/2013   Procedure: LEFT THUMB CARPAL METACARPAL ARTHROPLASTY;  Surgeon: Tennis Must, MD;  Location: [facility];  Service: Orthopedics;  Laterality: Left;  . IR ANGIO INTRA EXTRACRAN SEL COM CAROTID INNOMINATE BILAT MOD SED  01/05/2017  . IR ANGIO INTRA EXTRACRAN SEL COM CAROTID INNOMINATE BILAT MOD SED  03/19/2019  . IR ANGIO INTRA EXTRACRAN SEL INTERNAL CAROTID UNI L MOD SED  04/15/2019  . IR ANGIO VERTEBRAL SEL VERTEBRAL BILAT MOD SED  01/05/2017  . IR ANGIO VERTEBRAL SEL VERTEBRAL BILAT MOD SED  03/19/2019  . IR ANGIOGRAM FOLLOW UP STUDY  04/15/2019  . IR RADIOLOGIST EVAL & MGMT  12/30/2016  . IR TRANSCATH/EMBOLIZ  04/15/2019  . IR US GUIDE VASC ACCESS RIGHT  03/19/2019  . MALONEY DILATION N/A 09/20/2018   Procedure: Venia Minks DILATION;  Surgeon: Daneil Dolin, MD;  [facility];  Service: Endoscopy;  Laterality: N/A;  . RADIOLOGY WITH ANESTHESIA N/A 04/15/2019   Procedure: [employer];  Surgeon: Luanne Bras, MD;  Location: [facility];  Service: Radiology;  Laterality: N/A;  . RIGHT/LEFT HEART CATH AND CORONARY ANGIOGRAPHY N/A 08/19/2019   Procedure: RIGHT/LEFT HEART CATH AND CORONARY ANGIOGRAPHY;  Surgeon: Larey Dresser, MD;  Location: [facility];  Service: Cardiovascular;  Laterality: N/A;  . TUBAL LIGATION  1987  . VAGINAL HYSTERECTOMY  2009     Allergies:   Allergies  Allergen Reactions  . Tape Other (See Comments)    PEELS SKIN OFF  (PAPER TAPE IS FINE)     Social History:  reports that she has been smoking cigarettes. She has a 1.50 pack-year smoking history. She has never used smokeless tobacco. She reports previous alcohol use. She reports previous drug use. Drug: Marijuana.   Family History: Family History  Problem Relation Age of Onset  . Diabetes Father   . Hypertension Father   . CAD Father   . Colon polyps Father 60       pre-cancerous   . Stroke Father   . Dementia Father   . Stroke Mother   .  Hypertension Mother   . Diabetes Mother   . Heart failure Other   . Breast cancer Maternal Grandmother   . Colon cancer Neg Hx        Physical Exam: Vitals:   02/11/20 1507 02/11/20 1530 02/11/20 1600 02/11/20 1632  BP: 102/66 112/64 (!) 95/55 105/75  Pulse: 64 74 72 77  Resp: 20 (!) 23 17 20   Temp:      TempSrc:      SpO2: 100% 100% 100% 100%  Weight:      Height:        Constitutional:  Alert and awake, oriented x3, not in any acute distress. Eyes: PERLA, EOMI, irises appear normal, anicteric sclera,  ENMT: external ears and nose appear normal,             Lips appears normal, oropharynx mucosa, tongue, posterior pharynx appear  normal  Neck: neck appears normal, no masses, normal ROM, no thyromegaly, no JVD  CVS: S1-S2 clear, no murmur rubs or gallops, no LE edema, normal pedal pulses  Respiratory:  clear to auscultation bilaterally, no wheezing, rales or rhonchi. Respiratory effort normal. No accessory muscle use.  Abdomen: soft nontender, nondistended, normal bowel sounds, no hepatosplenomegaly, no hernias  Musculoskeletal: : no cyanosis, clubbing or edema noted bilaterally                       Neuro: Cranial nerves II-XII intact, strength, sensation, reflexes Psych: judgement and insight appear normal, stable mood and affect, mental status Skin: no rashes or lesions or ulcers, no induration or nodules    Data reviewed:  I have personally reviewed following labs and imaging studies Labs:  CBC: Recent Labs  Lab 02/11/20 0930  WBC 8.1  NEUTROABS 5.5  HGB 11.7*  HCT 36.1  MCV 90.0  PLT 270    Basic Metabolic Panel: Recent Labs  Lab 02/11/20 0930  NA 138  K 4.0  CL 106  CO2 24  GLUCOSE 92  BUN 15  CREATININE 0.86  CALCIUM 9.0  MG 2.5*   GFR Estimated Creatinine Clearance: 57.8 mL/min (by C-G formula based on SCr of 0.86 mg/dL). Liver Function Tests: Recent Labs  Lab 02/11/20 0930  AST 20  ALT 17  ALKPHOS 58  BILITOT 1.2  PROT 6.1*  ALBUMIN  3.5   Recent Labs  Lab 02/11/20 0930  LIPASE 51   No results for input(s): AMMONIA in the last 168 hours. Coagulation profile No results for input(s): INR, PROTIME in the last 168 hours.  Cardiac Enzymes: No results for input(s): CKTOTAL, CKMB, CKMBINDEX, TROPONINI in the last 168 hours. BNP: Invalid input(s): POCBNP CBG: No results for input(s): GLUCAP in the last 168 hours. D-Dimer No results for input(s): DDIMER in the last 72 hours. Hgb A1c No results for input(s): HGBA1C in the last 72 hours. Lipid Profile No results for input(s): CHOL, HDL, LDLCALC, TRIG, CHOLHDL, LDLDIRECT in the last 72 hours. Thyroid function studies No results for input(s): TSH, T4TOTAL, T3FREE, THYROIDAB in the last 72 hours.  Invalid input(s): FREET3 Anemia work up No results for input(s): VITAMINB12, FOLATE, FERRITIN, TIBC, IRON, RETICCTPCT in the last 72 hours. Urinalysis    Component Value Date/Time   COLORURINE YELLOW 01/07/2020 1906   APPEARANCEUR CLEAR 01/07/2020 1906   LABSPEC 1.017 01/07/2020 1906   PHURINE 5.0 01/07/2020 1906   GLUCOSEU >=500 (A) 01/07/2020 1906   HGBUR NEGATIVE 01/07/2020 1906   BILIRUBINUR NEGATIVE 01/07/2020 1906   KETONESUR NEGATIVE 01/07/2020 1906   PROTEINUR NEGATIVE 01/07/2020 1906   NITRITE NEGATIVE 01/07/2020 1906   LEUKOCYTESUR NEGATIVE 01/07/2020 1906     Microbiology Recent Results (from the past 240 hour(s))  Respiratory Panel by RT PCR (Flu A&B, Covid) - Nasopharyngeal Swab     Status: None   Collection Time: 02/11/20  8:00 AM   Specimen: Nasopharyngeal Swab  Result Value Ref Range Status   SARS Coronavirus 2 by RT PCR NEGATIVE NEGATIVE Final    Comment: (NOTE) SARS-CoV-2 target nucleic acids are NOT DETECTED.  The SARS-CoV-2 RNA is generally detectable in upper respiratoy specimens during the acute phase of infection. The lowest concentration of SARS-CoV-2 viral copies this assay can detect is 131 copies/mL. A negative result does not  preclude SARS-Cov-2 infection and should not be used as the sole basis for treatment or other patient management decisions. A negative result may  occur with  improper specimen collection/handling, submission of specimen other than nasopharyngeal swab, presence of viral mutation(s) within the areas targeted by this assay, and inadequate number of viral copies (<131 copies/mL). A negative result must be combined with clinical observations, patient history, and epidemiological information. The expected result is Negative.  Fact Sheet for Patients:  PinkCheek.be  Fact Sheet for Healthcare Providers:  GravelBags.it  This test is no t yet approved or cleared by the [place] FDA and  has been authorized for detection and/or diagnosis of SARS-CoV-2 by FDA under an Emergency Use Authorization (EUA). This EUA will remain  in effect (meaning this test can be used) for the duration of the COVID-19 declaration under Section 564(b)(1) of the Act, 21 U.S.C. section 360bbb-3(b)(1), unless the authorization is terminated or revoked sooner.     Influenza A by PCR NEGATIVE NEGATIVE Final   Influenza B by PCR NEGATIVE NEGATIVE Final    Comment: (NOTE) The Xpert Xpress SARS-CoV-2/FLU/RSV assay is intended as an aid in  the diagnosis of influenza from Nasopharyngeal swab specimens and  should not be used as a sole basis for treatment. Nasal washings and  aspirates are unacceptable for Xpert Xpress SARS-CoV-2/FLU/RSV  testing.  Fact Sheet for Patients: PinkCheek.be  Fact Sheet for Healthcare Providers: GravelBags.it  This test is not yet approved or cleared by the [place] FDA and  has been authorized for detection and/or diagnosis of SARS-CoV-2 by  FDA under an Emergency Use Authorization (EUA). This EUA will remain  in effect (meaning this test can be used) for the  duration of the  Covid-19 declaration under Section 564(b)(1) of the Act, 21  U.S.C. section 360bbb-3(b)(1), unless the authorization is  terminated or revoked. Performed at [facility], [address] [place], [place] [address]        Inpatient Medications:   Scheduled Meds: Continuous Infusions:   Radiological Exams on Admission: DG Chest Port 1 View  Result Date: 02/11/2020 CLINICAL DATA:  Shortness of breath. EXAM: PORTABLE CHEST 1 VIEW COMPARISON:  10/22/2019 FINDINGS: 0759 hours. Lungs are hyperexpanded. The lungs are clear without focal pneumonia, edema, pneumothorax or pleural effusion. Cardiopericardial silhouette is at upper limits of normal for size. The visualized bony structures of the thorax show no acute abnormality. Telemetry leads overlie the chest. IMPRESSION: Hyperexpansion without acute cardiopulmonary findings. Electronically Signed   By: Misty Stanley M.D.   On: 02/11/2020 08:08    Impression/Recommendations Active Problems:   Atypical chest pain   Cough  1. Upper respiratory tract infection, likely viral.  Will treat supportively at this time.  Continue antitussives, expectorants.  Patient was advised to keep yourself hydrated. 2. Chest pain.  Atypical.  Cardiac enzymes negative and EKG unrevealing.  Pain is pleuritic and worse with cough, likely related to #1.  Would not advise any further work-up at this time. 3. Hypotension should not/dizziness.  Appears to be a more chronic issue.  This was addressed in Dr. Myles Gip consult note from 12/2019.  She is on Coreg, Entresto and spironolactone which could be contributing to her low blood pressures.  She likely had acute worsening symptoms related to her poor p.o. intake in light of her viral illness.  She was hydrated in the emergency room with improvement of blood pressures.  Case reviewed with Dr. Harl Bowie who recommended to decrease dose of Coreg for now, continue Entresto and hold spironolactone for the next 3 to  4 days.  He will arrange close follow-up for patient with her  primary cardiologist. 4. The remainder of the patient's medical problems remained stable.  The patient was ambulated in the emergency room and orthostatics were checked which were noted to be negative after hydration.  At this point, she does not appear to meet criteria for hospitalization.  She has been advised to return to the emergency room if she has any worsening shortness of breath, fever or any worsening of her other symptoms.  Appropriate outpatient follow-up has been arranged.  We will plan on discharging the patient home.  Patient is agreeable.      Time Spent: 44mins  Kathie Dike M.D. [facility] 02/11/2020, 9:30 PM

## 2020-02-11 NOTE — ED Triage Notes (Signed)
Pt presents to ED with complaints of left sided chest pain, cough, nausea and vomiting since Sunday.

## 2020-02-18 ENCOUNTER — Encounter (HOSPITAL_COMMUNITY): Payer: PRIVATE HEALTH INSURANCE | Admitting: Cardiology

## 2020-02-18 ENCOUNTER — Other Ambulatory Visit (HOSPITAL_COMMUNITY): Payer: PRIVATE HEALTH INSURANCE

## 2020-03-17 ENCOUNTER — Encounter (HOSPITAL_COMMUNITY): Payer: Self-pay | Admitting: Emergency Medicine

## 2020-03-17 ENCOUNTER — Other Ambulatory Visit: Payer: Self-pay

## 2020-03-17 ENCOUNTER — Emergency Department (HOSPITAL_COMMUNITY)
Admission: EM | Admit: 2020-03-17 | Discharge: 2020-03-17 | Disposition: A | Discharge status: Home / Self Care | Payer: PRIVATE HEALTH INSURANCE | Attending: Emergency Medicine | Admitting: Emergency Medicine

## 2020-03-17 ENCOUNTER — Emergency Department (HOSPITAL_COMMUNITY): Payer: PRIVATE HEALTH INSURANCE

## 2020-03-17 DIAGNOSIS — I509 Heart failure, unspecified: Secondary | ICD-10-CM | POA: Diagnosis not present

## 2020-03-17 DIAGNOSIS — R5383 Other fatigue: Secondary | ICD-10-CM | POA: Insufficient documentation

## 2020-03-17 DIAGNOSIS — I2510 Atherosclerotic heart disease of native coronary artery without angina pectoris: Secondary | ICD-10-CM | POA: Diagnosis not present

## 2020-03-17 DIAGNOSIS — Z85828 Personal history of other malignant neoplasm of skin: Secondary | ICD-10-CM | POA: Insufficient documentation

## 2020-03-17 DIAGNOSIS — Z96692 Finger-joint replacement of left hand: Secondary | ICD-10-CM | POA: Insufficient documentation

## 2020-03-17 DIAGNOSIS — R059 Cough, unspecified: Secondary | ICD-10-CM | POA: Insufficient documentation

## 2020-03-17 DIAGNOSIS — F17210 Nicotine dependence, cigarettes, uncomplicated: Secondary | ICD-10-CM | POA: Diagnosis not present

## 2020-03-17 DIAGNOSIS — M791 Myalgia, unspecified site: Secondary | ICD-10-CM | POA: Insufficient documentation

## 2020-03-17 DIAGNOSIS — Z955 Presence of coronary angioplasty implant and graft: Secondary | ICD-10-CM | POA: Diagnosis not present

## 2020-03-17 DIAGNOSIS — Z20822 Contact with and (suspected) exposure to covid-19: Secondary | ICD-10-CM | POA: Insufficient documentation

## 2020-03-17 DIAGNOSIS — I110 Hypertensive heart disease with heart failure: Secondary | ICD-10-CM | POA: Diagnosis not present

## 2020-03-17 DIAGNOSIS — R079 Chest pain, unspecified: Secondary | ICD-10-CM | POA: Diagnosis present

## 2020-03-17 DIAGNOSIS — R110 Nausea: Secondary | ICD-10-CM | POA: Diagnosis not present

## 2020-03-17 DIAGNOSIS — R42 Dizziness and giddiness: Secondary | ICD-10-CM | POA: Diagnosis not present

## 2020-03-17 LAB — COMPREHENSIVE METABOLIC PANEL
ALT: 20 U/L (ref 0–44)
AST: 24 U/L (ref 15–41)
Albumin: 4.2 g/dL (ref 3.5–5.0)
Alkaline Phosphatase: 71 U/L (ref 38–126)
Anion gap: 11 (ref 5–15)
BUN: 12 mg/dL (ref 6–20)
CO2: 24 mmol/L (ref 22–32)
Calcium: 9.9 mg/dL (ref 8.9–10.3)
Chloride: 104 mmol/L (ref 98–111)
Creatinine, Ser: 0.84 mg/dL (ref 0.44–1.00)
GFR, Estimated: >60 mL/min (ref >60)
Glucose, Bld: 111 mg/dL — ABNORMAL HIGH (ref 70–99)
Potassium: 3.7 mmol/L (ref 3.5–5.1)
Sodium: 139 mmol/L (ref 135–145)
Total Bilirubin: 0.9 mg/dL (ref 0.3–1.2)
Total Protein: 7.5 g/dL (ref 6.5–8.1)

## 2020-03-17 LAB — CBC
HCT: 40.2 % (ref 36.0–46.0)
Hemoglobin: 12.6 g/dL (ref 12.0–15.0)
MCH: 28.2 pg (ref 26.0–34.0)
MCHC: 31.3 g/dL (ref 30.0–36.0)
MCV: 89.9 fL (ref 80.0–100.0)
Platelets: 425 10*3/uL — ABNORMAL HIGH (ref 150–400)
RBC: 4.47 MIL/uL (ref 3.87–5.11)
RDW: 15 % (ref 11.5–15.5)
WBC: 8.1 10*3/uL (ref 4.0–10.5)
nRBC: 0 % (ref 0.0–0.2)

## 2020-03-17 LAB — RESPIRATORY PANEL BY RT PCR (FLU A&B, COVID)
Influenza A by PCR: NEGATIVE
Influenza B by PCR: NEGATIVE
SARS Coronavirus 2 by RT PCR: NEGATIVE

## 2020-03-17 LAB — D-DIMER, QUANTITATIVE: D-Dimer, Quant: 0.35 ug/mL-FEU (ref 0.00–0.50)

## 2020-03-17 LAB — TROPONIN I (HIGH SENSITIVITY)
Troponin I (High Sensitivity): 7 ng/L (ref <18)
Troponin I (High Sensitivity): 6 ng/L (ref <18)

## 2020-03-17 MED ORDER — MORPHINE SULFATE (PF) 2 MG/ML IV SOLN
2.0000 mg | Freq: Once | INTRAVENOUS | Status: AC
  Administered 2020-03-17: 2 mg via INTRAVENOUS
  Filled 2020-03-17: qty 1

## 2020-03-17 MED ORDER — ONDANSETRON HCL 4 MG/2ML IJ SOLN
4.0000 mg | Freq: Once | INTRAMUSCULAR | Status: AC
  Administered 2020-03-17: 4 mg via INTRAVENOUS
  Filled 2020-03-17: qty 2

## 2020-03-17 NOTE — ED Triage Notes (Addendum)
Pt reports left sided chest pain and SHOB that woke her up from sleep at 0500 this morning. Pt also reports cough X 2 days.

## 2020-03-17 NOTE — Discharge Instructions (Signed)
You were evaluated in the Emergency Department and after careful evaluation, we did not find any emergent condition requiring admission or further testing in the hospital.  Your exam/testing today was overall reassuring.  Please return to the Emergency Department if you experience any worsening of your condition.  Thank you for allowing us to be a part of your care.  

## 2020-03-17 NOTE — ED Provider Notes (Signed)
[facility] Emergency Department Provider Note MRN:  [id number]  Arrival date & time: 03/17/20     Chief Complaint   Chest Pain   History of Present Illness   Erin [name] is a 59 y.o. year-old female with a history of CAD, CHF presenting to the ED with chief complaint of chest pain.  Location: Central chest Duration: 8 hours Onset: Gradual Timing: Constant Description: Pressure, like someone sitting on my chest Severity: Moderate to severe Exacerbating/Alleviating Factors: Worse with coughing Associated Symptoms: Cough, malaise, fatigue, body aches, multiple sick contacts at home who have tested positive for COVID-19; also endorsing intermittent right leg cramping recently, lightheadedness, nausea Pertinent Negatives: Denies shortness of breath   Review of Systems  A complete 10 system review of systems was obtained and all systems are negative except as noted in the HPI and PMH.   Patient's Health History    Past Medical History:  Diagnosis Date  . Anxiety state 03/25/2016  . Basal cell carcinoma of forehead   . Brain aneurysm   . CHF (congestive heart failure) ([place])   . Coronary artery disease    a. 03/11/16 PCI with DES-->Prox/Mid Cx;  b. 03/14/16 PCI with DES x2-->RCA, EF 30-35%.  . Essential hypertension   . GERD (gastroesophageal reflux disease)   . HFrEF (heart failure with reduced ejection fraction) ([place])    a. 10/2016 Echo: EF 35-40%, Gr1 DD, mild focal basal septal hypertrophy, basal inflat, mid inflat, basal antlat AK. Mid infept/inf/antlat, apical lateral sev HK. Mod MR. mildly reduced RV fxn. Mild TR.  [name] Kitchen History of pneumonia   . Hyperlipidemia   . IBS (irritable bowel syndrome)   . Ischemic cardiomyopathy    a. 10/2016 Echo: EF 35-40%, Gr1 DD.  [name] Kitchen Mitral regurgitation   . NSTEMI (non-ST elevated myocardial infarction) ([place]) 03/10/2016  . Pneumonia 03/2016  . Squamous cell cancer of skin of nose   . Thrombocytosis 03/26/2016  .  Tobacco abuse   . Trichimoniasis   . Wears dentures   . Wears glasses     Past Surgical History:  Procedure Laterality Date  . APPENDECTOMY    . BIOPSY  09/20/2018   Procedure: BIOPSY;  Surgeon: Daneil Dolin, MD;  [facility];  Service: Endoscopy;;  colon  . CARDIAC CATHETERIZATION N/A 03/11/2016   Procedure: Left Heart Cath and Coronary Angiography;  Surgeon: Leonie Man, MD;  Location: [place] [facility];  Service: Cardiovascular;  Laterality: N/A;  . CARDIAC CATHETERIZATION N/A 03/11/2016   Procedure: Coronary Stent Intervention;  Surgeon: Leonie Man, MD;  Location: [place] [facility];  Service: Cardiovascular;  Laterality: N/A;  . CARDIAC CATHETERIZATION N/A 03/14/2016   Procedure: Coronary Stent Intervention;  Surgeon: Peter M Martinique, MD;  Location: [place] [facility];  Service: Cardiovascular;  Laterality: N/A;  . CHOLECYSTECTOMY OPEN  1984  . COLONOSCOPY WITH PROPOFOL N/A 09/20/2018   Procedure: COLONOSCOPY WITH PROPOFOL;  Surgeon: Daneil Dolin, MD;  [facility];  Service: Endoscopy;  Laterality: N/A;  10:30am  . CORONARY ANGIOPLASTY WITH STENT PLACEMENT  03/14/2016  . ESOPHAGOGASTRODUODENOSCOPY (EGD) WITH PROPOFOL N/A 09/20/2018   Procedure: ESOPHAGOGASTRODUODENOSCOPY (EGD) WITH PROPOFOL;  Surgeon: Daneil Dolin, MD;  [facility];  Service: Endoscopy;  Laterality: N/A;  . FINGER ARTHROPLASTY Left 05/14/2013   Procedure: LEFT THUMB CARPAL METACARPAL ARTHROPLASTY;  Surgeon: Tennis Must, MD;  Location: [place];  Service: Orthopedics;  Laterality: Left;  . IR  ANGIO INTRA EXTRACRAN SEL COM CAROTID INNOMINATE BILAT MOD SED  01/05/2017  . IR ANGIO INTRA EXTRACRAN SEL COM CAROTID INNOMINATE BILAT MOD SED  03/19/2019  . IR ANGIO INTRA EXTRACRAN SEL INTERNAL CAROTID UNI L MOD SED  04/15/2019  . IR ANGIO VERTEBRAL SEL VERTEBRAL BILAT MOD SED  01/05/2017  . IR ANGIO VERTEBRAL SEL VERTEBRAL BILAT MOD SED  03/19/2019  . IR ANGIOGRAM  FOLLOW UP STUDY  04/15/2019  . IR RADIOLOGIST EVAL & MGMT  12/30/2016  . IR TRANSCATH/EMBOLIZ  04/15/2019  . IR US GUIDE VASC ACCESS RIGHT  03/19/2019  . MALONEY DILATION N/A 09/20/2018   Procedure: Venia Minks DILATION;  Surgeon: Daneil Dolin, MD;  [facility];  Service: Endoscopy;  Laterality: N/A;  . RADIOLOGY WITH ANESTHESIA N/A 04/15/2019   Procedure: [employer];  Surgeon: Luanne Bras, MD;  Location: [place];  Service: Radiology;  Laterality: N/A;  . RIGHT/LEFT HEART CATH AND CORONARY ANGIOGRAPHY N/A 08/19/2019   Procedure: RIGHT/LEFT HEART CATH AND CORONARY ANGIOGRAPHY;  Surgeon: Larey Dresser, MD;  Location: [place] [facility];  Service: Cardiovascular;  Laterality: N/A;  . TUBAL LIGATION  1987  . VAGINAL HYSTERECTOMY  2009    Family History  Problem Relation Age of Onset  . Diabetes Father   . Hypertension Father   . CAD Father   . Colon polyps Father 60       pre-cancerous   . Stroke Father   . Dementia Father   . Stroke Mother   . Hypertension Mother   . Diabetes Mother   . Heart failure Other   . Breast cancer Maternal Grandmother   . Colon cancer Neg Hx     Social History   Socioeconomic History  . Marital status: Married    Spouse name: Not on file  . Number of children: Not on file  . Years of education: Not on file  . Highest education level: Not on file  Occupational History  . Occupation: CNA  Tobacco Use  . Smoking status: Current Some Day Smoker    Packs/day: 0.10    Years: 15.00    Pack years: 1.50    Types: Cigarettes  . Smokeless tobacco: Never Used  . Tobacco comment: smokes a cigarette occasionally  Vaping Use  . Vaping Use: Never used  Substance and Sexual Activity  . Alcohol use: Not Currently    Comment: occasionally  . Drug use: Not Currently    Types: Marijuana    Comment: former- 2017 last time  . Sexual activity: Not Currently    Birth control/protection: Surgical    Comment: hyst  Other Topics Concern  . Not on  file  Social History Narrative   Lives with husband in [place] in a one story home with a basement.  Has 4 children.  Works as a Quarry manager.  Education: CNA school.    Social Determinants of Health   Financial Resource Strain:   . Difficulty of Paying Living Expenses: Not on file  Food Insecurity:   . Worried About Charity fundraiser in the Last Year: Not on file  . Ran Out of Food in the Last Year: Not on file  Transportation Needs:   . Lack of Transportation (Medical): Not on file  . Lack of Transportation (Non-Medical): Not on file  Physical Activity:   . Days of Exercise per Week: Not on file  . Minutes of Exercise per Session: Not on file  Stress:   . Feeling of Stress :  Not on file  Social Connections:   . Frequency of Communication with Friends and Family: Not on file  . Frequency of Social Gatherings with Friends and Family: Not on file  . Attends Religious Services: Not on file  . Active Member of Clubs or Organizations: Not on file  . Attends Archivist Meetings: Not on file  . Marital Status: Not on file  Intimate Partner Violence:   . Fear of Current or Ex-Partner: Not on file  . Emotionally Abused: Not on file  . Physically Abused: Not on file  . Sexually Abused: Not on file     Physical Exam   Vitals:   03/17/20 1130 03/17/20 1200  BP: 102/72 125/74  Pulse: 76 75  Resp: (!) 21 (!) 22  Temp:    SpO2: 99% 99%    CONSTITUTIONAL: Chronically ill-appearing, NAD NEURO:  Alert and oriented x 3, no focal deficits EYES:  eyes equal and reactive ENT/NECK:  no LAD, no JVD CARDIO: Regular rate, well-perfused, normal S1 and S2 PULM:  CTAB no wheezing or rhonchi GI/GU:  normal bowel sounds, non-distended, non-tender MSK/SPINE:  No gross deformities, no edema SKIN:  no rash, atraumatic PSYCH:  Appropriate speech and behavior  *Additional and/or pertinent findings included in MDM below  Diagnostic and Interventional Summary    EKG  Interpretation  Date/Time:  Tuesday March 17 2020 07:03:46 EST Ventricular Rate:  96 PR Interval:    QRS Duration: 94 QT Interval:  345 QTC Calculation: 436 R Axis:   -11 Text Interpretation: Sinus rhythm Ventricular premature complex Aberrant complex Borderline low voltage, extremity leads No significant change was found Confirmed by Gerlene Fee [phone]) on 03/17/2020 7:05:30 AM      Labs Reviewed  CBC - Abnormal; Notable for the following components:      Result Value   Platelets 425 (*)    All other components within normal limits  COMPREHENSIVE METABOLIC PANEL - Abnormal; Notable for the following components:   Glucose, Bld 111 (*)    All other components within normal limits  RESPIRATORY PANEL BY RT PCR (FLU A&B, COVID)  D-DIMER, QUANTITATIVE (NOT AT [facility])  TROPONIN I (HIGH SENSITIVITY)  TROPONIN I (HIGH SENSITIVITY)    DG Chest Port 1 View  Final Result      Medications  ondansetron [facility]) injection 4 mg (4 mg Intravenous Given 03/17/20 0829)  morphine 2 MG/ML injection 2 mg (2 mg Intravenous Given 03/17/20 7035)     Procedures  /  Critical Care Procedures  ED Course and Medical Decision Making  I have reviewed the triage vital signs, the nursing notes, and pertinent available records from the EMR.  Listed above are laboratory and imaging tests that I personally ordered, reviewed, and interpreted and then considered in my medical decision making (see below for details).  Pressure-like central chest pain raises concern for possible ACS, though with her associated viral symptoms also considering chest wall related pain due to coughing.  Given patient's sick contacts there is high suspicion for COVID-19 infection.  She is also having lightheadedness and right leg cramping, raising some concern for PE as well.  EKG is without ischemic features, awaiting troponin and D-dimer and Covid test.     Work-up is reassuring, troponin negative x2, D-dimer negative, Covid test  is negative, patient continues to look and feel well with normal vital signs, suspect viral illness, appropriate for discharge.  Barth Kirks. Sedonia Small, [place] [email or web address]  Final Clinical Impressions(s) / ED Diagnoses     ICD-10-CM   1. Chest pain, unspecified type  R07.9     ED Discharge Orders    None       Discharge Instructions Discussed with and Provided to Patient:     Discharge Instructions     You were evaluated in the Emergency Department and after careful evaluation, we did not find any emergent condition requiring admission or further testing in the hospital.  Your exam/testing today was overall reassuring.  Please return to the Emergency Department if you experience any worsening of your condition.  Thank you for allowing [place] to be a part of your care.        Maudie Flakes, MD 03/17/20 [phone]

## 2020-03-18 ENCOUNTER — Other Ambulatory Visit: Payer: Self-pay | Admitting: *Deleted

## 2020-03-18 MED ORDER — SACUBITRIL-VALSARTAN 24-26 MG PO TABS: 1.0000 | ORAL_TABLET | Freq: Two times a day (BID) | ORAL | 11 refills | Status: DC

## 2020-03-19 ENCOUNTER — Other Ambulatory Visit (HOSPITAL_COMMUNITY): Payer: Self-pay

## 2020-03-19 MED ORDER — SACUBITRIL-VALSARTAN 24-26 MG PO TABS: 1.0000 | ORAL_TABLET | Freq: Two times a day (BID) | ORAL | 11 refills | Status: DC

## 2020-03-19 MED ORDER — CARVEDILOL 6.25 MG PO TABS
3.1250 mg | ORAL_TABLET | Freq: Two times a day (BID) | ORAL | 1 refills | Status: DC
Start: 2020-03-19 — End: 2020-03-20

## 2020-03-20 ENCOUNTER — Encounter (HOSPITAL_COMMUNITY): Payer: Self-pay | Admitting: Cardiology

## 2020-03-20 ENCOUNTER — Other Ambulatory Visit: Payer: Self-pay

## 2020-03-20 ENCOUNTER — Ambulatory Visit (HOSPITAL_COMMUNITY)
Admission: RE | Admit: 2020-03-20 | Discharge: 2020-03-20 | Disposition: A | Discharge status: Home / Self Care | Payer: 59 | Source: Ambulatory Visit | Attending: Cardiology | Admitting: Cardiology

## 2020-03-20 ENCOUNTER — Ambulatory Visit (HOSPITAL_BASED_OUTPATIENT_CLINIC_OR_DEPARTMENT_OTHER)
Admission: RE | Admit: 2020-03-20 | Discharge: 2020-03-20 | Disposition: A | Discharge status: Home / Self Care | Payer: 59 | Source: Ambulatory Visit | Attending: Cardiology | Admitting: Cardiology

## 2020-03-20 VITALS — BP 110/70 | HR 80 | Wt 122.2 lb

## 2020-03-20 DIAGNOSIS — I2510 Atherosclerotic heart disease of native coronary artery without angina pectoris: Secondary | ICD-10-CM | POA: Diagnosis not present

## 2020-03-20 DIAGNOSIS — M79662 Pain in left lower leg: Secondary | ICD-10-CM | POA: Insufficient documentation

## 2020-03-20 DIAGNOSIS — Z79899 Other long term (current) drug therapy: Secondary | ICD-10-CM | POA: Diagnosis not present

## 2020-03-20 DIAGNOSIS — Z955 Presence of coronary angioplasty implant and graft: Secondary | ICD-10-CM | POA: Insufficient documentation

## 2020-03-20 DIAGNOSIS — Z7982 Long term (current) use of aspirin: Secondary | ICD-10-CM | POA: Insufficient documentation

## 2020-03-20 DIAGNOSIS — I255 Ischemic cardiomyopathy: Secondary | ICD-10-CM | POA: Diagnosis not present

## 2020-03-20 DIAGNOSIS — I5042 Chronic combined systolic (congestive) and diastolic (congestive) heart failure: Secondary | ICD-10-CM | POA: Diagnosis not present

## 2020-03-20 DIAGNOSIS — M79672 Pain in left foot: Secondary | ICD-10-CM | POA: Insufficient documentation

## 2020-03-20 DIAGNOSIS — Z7902 Long term (current) use of antithrombotics/antiplatelets: Secondary | ICD-10-CM | POA: Insufficient documentation

## 2020-03-20 DIAGNOSIS — I739 Peripheral vascular disease, unspecified: Secondary | ICD-10-CM | POA: Insufficient documentation

## 2020-03-20 DIAGNOSIS — Z8249 Family history of ischemic heart disease and other diseases of the circulatory system: Secondary | ICD-10-CM | POA: Diagnosis not present

## 2020-03-20 DIAGNOSIS — F17210 Nicotine dependence, cigarettes, uncomplicated: Secondary | ICD-10-CM | POA: Diagnosis not present

## 2020-03-20 DIAGNOSIS — I252 Old myocardial infarction: Secondary | ICD-10-CM | POA: Insufficient documentation

## 2020-03-20 LAB — LIPID PANEL
Cholesterol: 118 mg/dL (ref 0–200)
HDL: 49 mg/dL (ref >40)
LDL Cholesterol: 41 mg/dL (ref 0–99)
Total CHOL/HDL Ratio: 2.4 RATIO
Triglycerides: 138 mg/dL (ref <150)
VLDL: 28 mg/dL (ref 0–40)

## 2020-03-20 LAB — ECHOCARDIOGRAM COMPLETE
AR max vel: 2.23 cm2
AV Area VTI: 1.97 cm2
AV Area mean vel: 1.91 cm2
AV Mean grad: 3 mmHg
AV Peak grad: 5.4 mmHg
Ao pk vel: 1.16 m/s
Area-P 1/2: 5.62 cm2
Calc EF: 26.3 %
S' Lateral: 4.5 cm
Single Plane A2C EF: 25.1 %
Single Plane A4C EF: 32.2 %

## 2020-03-20 MED ORDER — CARVEDILOL 6.25 MG PO TABS
3.1250 mg | ORAL_TABLET | Freq: Two times a day (BID) | ORAL | 6 refills | Status: DC
Start: 2020-03-20 — End: 2020-03-20

## 2020-03-20 MED ORDER — CARVEDILOL 6.25 MG PO TABS: 3.1250 mg | ORAL_TABLET | Freq: Two times a day (BID) | ORAL | 6 refills | Status: DC

## 2020-03-20 NOTE — Progress Notes (Signed)
  Echocardiogram 2D Echocardiogram has been performed.  Erin [name] 03/20/2020, 9:33 AM

## 2020-03-20 NOTE — Addendum Note (Signed)
Addended by: Leontina Skidmore, Sharlot Gowda on: 03/20/2020 04:08 PM   Modules accepted: Orders

## 2020-03-20 NOTE — Patient Instructions (Signed)
RESTART Coreg 3.125mg  (1/2 tab) twice a day  Labs today We will only contact you if something comes back abnormal or we need to make some changes. Otherwise no news is good news!  Your physician has requested that you have an ankle brachial index (ABI). During this test an ultrasound and blood pressure cuff are used to evaluate the arteries that supply the arms and legs with blood. Allow thirty minutes for this exam. There are no restrictions or special instructions. You will be called to schedule this appointment.  You have been referred to Cardiac Eletrophysiology to discuss ICD.  An implantable cardioverter defibrillator (ICD) is a small device that is placed in your chest or, in rare cases, your abdomen. This device uses electrical pulses or shocks to help control life-threatening, irregular heartbeats that could lead the heart to suddenly stop beating (sudden cardiac arrest). Leads are attached to the ICD that goes into your heart. This is done in the hospital and usually requires an overnight stay. You will get a call to schedule this appointment.   Your physician recommends that you schedule a follow-up appointment in: 3 months with Dr Aundra Dubin  Please call office at [phone] option 2 if you have any questions or concerns.   At the [facility], you and your health needs are our priority. As part of our continuing mission to provide you with exceptional heart care, we have created designated Provider Care Teams. These Care Teams include your primary Cardiologist (physician) and Advanced Practice Providers (APPs- Physician Assistants and Nurse Practitioners) who all work together to provide you with the care you need, when you need it.   You may see any of the following providers on your designated Care Team at your next follow up: [name] Kitchen Dr Glori Bickers . Dr Loralie Champagne . Darrick Grinder, NP . Lyda Jester, PA . [name], PharmD   Please be sure to bring in all  your medications bottles to every appointment.

## 2020-03-21 NOTE — Progress Notes (Signed)
PCP: [name], MD Cardiology: [name] HF Cardiology: [name]  59 y.o. with history of CAD, ischemic cardiomyopathy, and PAD was referred by [name] for evaluation of CHF.  She had NSTEMI in 11/17 with PCI to prox/mid LCx and RCA.  Subsequently, has developed ischemic cardiomyopathy.  Most recent echo in 1/21 showed EF 30-35%.  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   RHC/LHC done with exertional chest heaviness and dyspnea in 4/21 showed normal filling pressures, preserved cardiac output, and nonobstructive mild CAD.  ABIs were normal in 4/21.   In 8/21, she had syncope thought to be related to orthostasis from low BP in the setting of cardiac medication titration.  Entresto was decreased.   She was in the ER in 11/21 with chest pain.  Troponin and COVID-19 negative.   Echo was done today and reviewed, EF 30-35%, normal RV.   She returns for followup of CHF.  Rare smoking now.  No orthostatic symptoms now.  No further chest pain.  She says that the pain that took her to the ER was associated with coughing and sneezing.  They told her she had viral bronchitis.  No significant dyspnea. No orthopnea/PND.  She has been noting left lower leg and foot pain with walking.   Labs (2/21): K 3.7, creatinine 0.82 Labs (4/21): K 4.3, creatinine 0.81, LDL 67, TGs 286 Labs (6/21): K 4.7, creatinine 1.22 Labs (11/21): K 3.7, creatinine 0.84, hgb 12.6  PMH: 1. CAD: NSTEMI with DES to proximal and mid LCx in 11/17, staged DES x 2 to RCA later in 11/17.  - Cardiolite (10/20): EF < 30%, large inferior and inferolateral MI with mild peri-infarct ischemia.  - LHC (4/21): Patent stents, nonobstructive CAD.  2. Chronic systolic CHF: Ischemic cardiomyopathy.  - Echo (10/20): EF 35-40%, lateral WMAs, normal RV. - Echo (1/21): EF 30-35%, mild LVH, normal RV - RHC (4/21): mean RA 5, PA 29/3, mean PCWP 12, CI 3.04 - Echo (11/21): EF 30-35%, normal RV.   3. 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  4. Carotid stenosis: Carotid dopplers (62/56) with 38-93% LICA stenosis.  - Carotid dopplers (6/21): 40-59% BICA stenosis.  5. Active smoker.  6. ABIs (4/21): Normal.   Social History   Socioeconomic History  . Marital status: Married    Spouse name: Not on file  . Number of children: Not on file  . Years of education: Not on file  . Highest education level: Not on file  Occupational History  . Occupation: CNA  Tobacco Use  . Smoking status: Current Some Day Smoker    Packs/day: 0.10    Years: 15.00    Pack years: 1.50    Types: Cigarettes  . Smokeless tobacco: Never Used  . Tobacco comment: smokes a cigarette occasionally  Vaping Use  . Vaping Use: Never used  Substance and Sexual Activity  . Alcohol use: Not Currently    Comment: occasionally  . Drug use: Not Currently    Types: Marijuana    Comment: former- 2017 last time  . Sexual activity: Not Currently    Birth control/protection: Surgical    Comment: hyst  Other Topics Concern  . Not on file  Social History Narrative   Lives with husband in [place] in a one story home with a basement.  Has 4 children.  Works as a Quarry manager.  Education: CNA school.    Social Determinants of Radio broadcast assistant  Strain:   . Difficulty of Paying Living Expenses: Not on file  Food Insecurity:   . Worried About Charity fundraiser in the Last Year: Not on file  . Ran Out of Food in the Last Year: Not on file  Transportation Needs:   . Lack of Transportation (Medical): Not on file  . Lack of Transportation (Non-Medical): Not on file  Physical Activity:   . Days of Exercise per Week: Not on file  . Minutes of Exercise per Session: Not on file  Stress:   . Feeling of Stress : Not on file  Social Connections:   . Frequency of Communication with Friends and Family: Not on file  . Frequency of Social Gatherings with Friends and Family: Not on file  .  Attends Religious Services: Not on file  . Active Member of Clubs or Organizations: Not on file  . Attends Archivist Meetings: Not on file  . Marital Status: Not on file  Intimate Partner Violence:   . Fear of Current or Ex-Partner: Not on file  . Emotionally Abused: Not on file  . Physically Abused: Not on file  . Sexually Abused: Not on file   Family History  Problem Relation Age of Onset  . Diabetes Father   . Hypertension Father   . CAD Father   . Colon polyps Father 60       pre-cancerous   . Stroke Father   . Dementia Father   . Stroke Mother   . Hypertension Mother   . Diabetes Mother   . Heart failure Other   . Breast cancer Maternal Grandmother   . Colon cancer Neg Hx    ROS: All systems reviewed and negative except as per HPI.   Current Outpatient Medications  Medication Sig Dispense Refill  . acetaminophen (TYLENOL) 325 MG tablet Take 325 mg by mouth every 6 (six) hours as needed for mild pain or headache.     . ALPRAZolam (XANAX) 1 MG tablet Take 1-2 mg by mouth at bedtime.     [name] Kitchen aspirin EC 81 MG tablet Take 81 mg by mouth every evening.     [name] Kitchen atorvastatin (LIPITOR) 80 MG tablet TAKE 1 TABLET BY MOUTH ONCE DAILY AT  6  IN  THE  EVENING (Patient taking differently: Take 80 mg by mouth daily. ) 90 tablet 2  . clopidogrel (PLAVIX) 75 MG tablet Take 1 tablet by mouth once daily (Patient taking differently: Take 75 mg by mouth daily. ) 60 tablet 3  . dapagliflozin propanediol (FARXIGA) 10 MG TABS tablet Take 10 mg by mouth daily before breakfast. 30 tablet 11  . ezetimibe (ZETIA) 10 MG tablet Take 1 tablet (10 mg total) by mouth daily. 90 tablet 1  . gabapentin (NEURONTIN) 100 MG capsule Take 100 mg by mouth 2 (two) times daily.     [name] Kitchen loperamide (IMODIUM) 2 MG capsule Take 2 mg by mouth 4 (four) times daily as needed for diarrhea or loose stools (ibs).     . LORazepam (ATIVAN) 1 MG tablet Take 0.5-1 mg by mouth daily.   1  . Multiple Vitamins-Minerals  (MULTIVITAMIN WITH MINERALS) tablet Take 1 tablet by mouth daily.    . nitroGLYCERIN (NITROSTAT) 0.4 MG SL tablet Place 1 tablet (0.4 mg total) under the tongue every 5 (five) minutes x 3 doses as needed for chest pain. 30 tablet 12  . omeprazole (PRILOSEC) 20 MG capsule Take 20 mg by mouth daily.     [name] Kitchen  rOPINIRole (REQUIP) 0.5 MG tablet Take 0.5 mg by mouth at bedtime.   0  . sacubitril-valsartan (ENTRESTO) 24-26 MG Take 1 tablet by mouth 2 (two) times daily. 60 tablet 11  . spironolactone (ALDACTONE) 25 MG tablet Take 0.5 tablets (12.5 mg total) by mouth daily. Resume in 3 days    . zinc gluconate 50 MG tablet Take 50 mg by mouth daily.    . carvedilol (COREG) 6.25 MG tablet Take 0.5 tablets (3.125 mg total) by mouth 2 (two) times daily. 30 tablet 6   No current facility-administered medications for this encounter.   BP 110/70   Pulse 80   Wt 55.4 kg (122 lb 3.2 oz)   SpO2 99%   BMI 23.87 kg/m  General: NAD Neck: No JVD, no thyromegaly or thyroid nodule.  Lungs: Clear to auscultation bilaterally with normal respiratory effort. CV: Nondisplaced PMI.  Heart regular S1/S2, no S3/S4, no murmur.  No peripheral edema.  No carotid bruit.  Unable to palpate pedal pulses.  Abdomen: Soft, nontender, no hepatosplenomegaly, no distention.  Skin: Intact without lesions or rashes.  Neurologic: Alert and oriented x 3.  Psych: Normal affect. Extremities: No clubbing or cyanosis.  HEENT: Normal.   Assessment/Plan: 1. CAD: S/p NSTEMI in 11/17 with DEX to LCx and DES x 2 to RCA.  Cardiolite in 10/20 with EF <30%, inferior/inferolateral infarct with peri-infarct ischemia. LHC in 4/21 showed nonobstructive mild CAD.  Recent atypical chest pain, now resolved.  It was pleuritic and likely related to bronchitis.  - Continue atorvastatin and Zetia.  Check lipids today.  - Continue ASA 81 and Plavix 75 mg daily for now.  She will likely be a good candidate to replace Plavix with rivaroxaban 2.5 mg bid in the  future. She will continue Plavix as long as Dr. Estanislado Pandy feels it is needed post her cranial circulation intervention.  2. Chronic systolic CHF: Ischemic cardiomyopathy. Echo today showed stable EF 30-35%.  RHC in 4/21 with normal filling pressures and preserved cardiac output.  She is not volume overloaded on exam.  - Continue Entresto 24/26 bid, will not titrate given orthostasis on middle dose.  - She has been completely off Coreg.  I will have her restart Coreg at 3.125 mg bid.     - Continue spironolactone 25 mg daily.  BMET today.  - Continue dapagliflozin 10 mg daily.  - Narrow QRS so not CRT candidate but EF has been persistently low with ischemic cardiomyopathy.  Refer to EP for ICD.  3. Carotid stenosis: Repeat carotid dopplers in 6/22.   4. Smoking: Strongly encouraged her to quit.  She has cut back a lot.  5. 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  6. PAD: ?Left leg claudication.  - I will order ABIs.   Followup in 3 months.   Erin [name] 03/21/2020

## 2020-03-24 ENCOUNTER — Other Ambulatory Visit (HOSPITAL_COMMUNITY): Payer: Self-pay | Admitting: Cardiology

## 2020-03-24 DIAGNOSIS — I739 Peripheral vascular disease, unspecified: Secondary | ICD-10-CM

## 2020-03-26 ENCOUNTER — Encounter: Payer: Self-pay | Admitting: Cardiology

## 2020-03-26 ENCOUNTER — Other Ambulatory Visit: Payer: Self-pay

## 2020-03-26 ENCOUNTER — Ambulatory Visit (INDEPENDENT_AMBULATORY_CARE_PROVIDER_SITE_OTHER): Payer: 59 | Admitting: Cardiology

## 2020-03-26 VITALS — BP 110/72 | HR 88 | Ht 60.0 in | Wt 125.6 lb

## 2020-03-26 DIAGNOSIS — I255 Ischemic cardiomyopathy: Secondary | ICD-10-CM

## 2020-03-26 NOTE — Progress Notes (Signed)
Electrophysiology Office Note   Date:  03/26/2020   ID:  Erin, [name] [birth year], MRN [id number]  PCP:  Erin Gravel, MD  Cardiologist:  Erin Dubin Primary Electrophysiologist:  Kamylah Manzo Meredith Leeds, MD    Chief Complaint: CHF   History of Present Illness: Erin [name] is a 59 y.o. female who is being seen today for the evaluation of CHF at the request of Erin Dresser, MD. Presenting today for electrophysiology evaluation.  She has a history significant for coronary artery disease, ischemic cardiomyopathy, and peripheral arterial disease. She had a non-STEMI November 2017 with PCI to the circumflex and RCA. She subsequently developed an ischemic cardiomyopathy with an echo showing an ejection fraction of 30 to 35%. December 2020 she had embolization of a left posterior communicating artery aneurysm. She had a right and left heart catheterization done for chest heaviness that showed normal filling pressures preserved cardiac output and mild nonobstructive coronary artery disease. This was April 2021. That same month she had normal ABIs. August 2021 she had syncope which was thought related to orthostasis. Her Entresto dose was decreased.  Today, she denies symptoms of palpitations, chest pain, shortness of breath, orthopnea, PND, lower extremity edema, claudication, dizziness, presyncope, syncope, bleeding, or neurologic sequela. The patient is tolerating medications without difficulties.  Her main complaint today is fatigue and mild shortness of breath.  She states that this has been occurring since her heart failure diagnosis.  She does not notice any difference since starting her heart failure medications.   Past Medical History:  Diagnosis Date  . Anxiety state 03/25/2016  . Basal cell carcinoma of forehead   . Brain aneurysm   . CHF (congestive heart failure) ([place])   . Coronary artery disease    a. 03/11/16 PCI with DES-->Prox/Mid Cx;  b. 03/14/16 PCI with DES x2-->RCA, EF  30-35%.  . Essential hypertension   . GERD (gastroesophageal reflux disease)   . HFrEF (heart failure with reduced ejection fraction) ([place])    a. 10/2016 Echo: EF 35-40%, Gr1 DD, mild focal basal septal hypertrophy, basal inflat, mid inflat, basal antlat AK. Mid infept/inf/antlat, apical lateral sev HK. Mod MR. mildly reduced RV fxn. Mild TR.  Erin [name] History of pneumonia   . Hyperlipidemia   . IBS (irritable bowel syndrome)   . Ischemic cardiomyopathy    a. 10/2016 Echo: EF 35-40%, Gr1 DD.  Erin [name] Mitral regurgitation   . NSTEMI (non-ST elevated myocardial infarction) ([place]) 03/10/2016  . Pneumonia 03/2016  . Squamous cell cancer of skin of nose   . Thrombocytosis 03/26/2016  . Tobacco abuse   . Trichimoniasis   . Wears dentures   . Wears glasses    Past Surgical History:  Procedure Laterality Date  . APPENDECTOMY    . BIOPSY  09/20/2018   Procedure: BIOPSY;  Surgeon: Daneil Dolin, MD;  [facility];  Service: Endoscopy;;  colon  . CARDIAC CATHETERIZATION N/A 03/11/2016   Procedure: Left Heart Cath and Coronary Angiography;  Surgeon: Leonie Man, MD;  Location: [place] [facility];  Service: Cardiovascular;  Laterality: N/A;  . CARDIAC CATHETERIZATION N/A 03/11/2016   Procedure: Coronary Stent Intervention;  Surgeon: Leonie Man, MD;  Location: [place] [facility];  Service: Cardiovascular;  Laterality: N/A;  . CARDIAC CATHETERIZATION N/A 03/14/2016   Procedure: Coronary Stent Intervention;  Surgeon: Peter M Martinique, MD;  Location: [place] [facility];  Service: Cardiovascular;  Laterality: N/A;  . CHOLECYSTECTOMY OPEN  1984  . COLONOSCOPY  WITH PROPOFOL N/A 09/20/2018   Procedure: COLONOSCOPY WITH PROPOFOL;  Surgeon: Daneil Dolin, MD;  [facility];  Service: Endoscopy;  Laterality: N/A;  10:30am  . CORONARY ANGIOPLASTY WITH STENT PLACEMENT  03/14/2016  . ESOPHAGOGASTRODUODENOSCOPY (EGD) WITH PROPOFOL N/A 09/20/2018   Procedure: ESOPHAGOGASTRODUODENOSCOPY (EGD) WITH  PROPOFOL;  Surgeon: Daneil Dolin, MD;  [facility];  Service: Endoscopy;  Laterality: N/A;  . FINGER ARTHROPLASTY Left 05/14/2013   Procedure: LEFT THUMB CARPAL METACARPAL ARTHROPLASTY;  Surgeon: Tennis Must, MD;  Location: [place];  Service: Orthopedics;  Laterality: Left;  . IR ANGIO INTRA EXTRACRAN SEL COM CAROTID INNOMINATE BILAT MOD SED  01/05/2017  . IR ANGIO INTRA EXTRACRAN SEL COM CAROTID INNOMINATE BILAT MOD SED  03/19/2019  . IR ANGIO INTRA EXTRACRAN SEL INTERNAL CAROTID UNI L MOD SED  04/15/2019  . IR ANGIO VERTEBRAL SEL VERTEBRAL BILAT MOD SED  01/05/2017  . IR ANGIO VERTEBRAL SEL VERTEBRAL BILAT MOD SED  03/19/2019  . IR ANGIOGRAM FOLLOW UP STUDY  04/15/2019  . IR RADIOLOGIST EVAL & MGMT  12/30/2016  . IR TRANSCATH/EMBOLIZ  04/15/2019  . IR US GUIDE VASC ACCESS RIGHT  03/19/2019  . MALONEY DILATION N/A 09/20/2018   Procedure: Venia Minks DILATION;  Surgeon: Daneil Dolin, MD;  [facility];  Service: Endoscopy;  Laterality: N/A;  . RADIOLOGY WITH ANESTHESIA N/A 04/15/2019   Procedure: [employer];  Surgeon: Luanne Bras, MD;  Location: [place];  Service: Radiology;  Laterality: N/A;  . RIGHT/LEFT HEART CATH AND CORONARY ANGIOGRAPHY N/A 08/19/2019   Procedure: RIGHT/LEFT HEART CATH AND CORONARY ANGIOGRAPHY;  Surgeon: Erin Dresser, MD;  Location: [place] [facility];  Service: Cardiovascular;  Laterality: N/A;  . TUBAL LIGATION  1987  . VAGINAL HYSTERECTOMY  2009     Current Outpatient Medications  Medication Sig Dispense Refill  . acetaminophen (TYLENOL) 325 MG tablet Take 325 mg by mouth every 6 (six) hours as needed for mild pain or headache.     . ALPRAZolam (XANAX) 1 MG tablet Take 1-2 mg by mouth at bedtime.     Erin [name] aspirin EC 81 MG tablet Take 81 mg by mouth every evening.     Erin [name] atorvastatin (LIPITOR) 80 MG tablet TAKE 1 TABLET BY MOUTH ONCE DAILY AT  6  IN  THE  EVENING 90 tablet 2  . carvedilol (COREG) 6.25 MG tablet Take 0.5  tablets (3.125 mg total) by mouth 2 (two) times daily. 30 tablet 6  . clopidogrel (PLAVIX) 75 MG tablet Take 1 tablet by mouth once daily 60 tablet 3  . dapagliflozin propanediol (FARXIGA) 10 MG TABS tablet Take 10 mg by mouth daily before breakfast. 30 tablet 11  . ezetimibe (ZETIA) 10 MG tablet Take 1 tablet (10 mg total) by mouth daily. 90 tablet 1  . gabapentin (NEURONTIN) 100 MG capsule Take 100 mg by mouth 2 (two) times daily.     Erin [name] loperamide (IMODIUM) 2 MG capsule Take 2 mg by mouth 4 (four) times daily as needed for diarrhea or loose stools (ibs).     . LORazepam (ATIVAN) 1 MG tablet Take 0.5-1 mg by mouth daily.   1  . Multiple Vitamins-Minerals (MULTIVITAMIN WITH MINERALS) tablet Take 1 tablet by mouth daily.    . nitroGLYCERIN (NITROSTAT) 0.4 MG SL tablet Place 1 tablet (0.4 mg total) under the tongue every 5 (five) minutes x 3 doses as needed for chest pain. 30 tablet 12  . omeprazole (PRILOSEC)  20 MG capsule Take 20 mg by mouth daily.     Erin [name] rOPINIRole (REQUIP) 0.5 MG tablet Take 0.5 mg by mouth at bedtime.   0  . sacubitril-valsartan (ENTRESTO) 24-26 MG Take 1 tablet by mouth 2 (two) times daily. 60 tablet 11  . spironolactone (ALDACTONE) 25 MG tablet Take 0.5 tablets (12.5 mg total) by mouth daily. Resume in 3 days    . zinc gluconate 50 MG tablet Take 50 mg by mouth daily.     No current facility-administered medications for this visit.    Allergies:   Tape   Social History:  The patient  reports that she has been smoking cigarettes. She has a 1.50 pack-year smoking history. She has never used smokeless tobacco. She reports previous alcohol use. She reports previous drug use. Drug: Marijuana.   Family History:  The patient's family history includes Breast cancer in her maternal grandmother; CAD in her father; Colon polyps (age of onset: 78) in her father; Dementia in her father; Diabetes in her father and mother; Heart failure in an other family member; Hypertension in her  father and mother; Stroke in her father and mother.    ROS:  Please see the history of present illness.   Otherwise, review of systems is positive for none.   All other systems are reviewed and negative.    PHYSICAL EXAM: VS:  BP 110/72   Pulse 88   Ht 5' (1.524 m)   Wt 125 lb 9.6 oz (57 kg)   SpO2 98%   BMI 24.53 kg/m  , BMI Body mass index is 24.53 kg/m. GEN: Well nourished, well developed, in no acute distress  HEENT: normal  Neck: no JVD, carotid bruits, or masses Cardiac: RRR; no murmurs, rubs, or gallops,no edema  Respiratory:  clear to auscultation bilaterally, normal work of breathing GI: soft, nontender, nondistended, + BS MS: no deformity or atrophy  Skin: warm and dry Neuro:  Strength and sensation are intact Psych: euthymic mood, full affect  EKG:  EKG is not ordered today. Personal review of the ekg ordered 03/17/20 shows sinus rhythm, rate 96  Recent Labs: 12/10/2019: TSH 0.674 02/11/2020: B Natriuretic Peptide 134.0; Magnesium 2.5 03/17/2020: ALT 20; BUN 12; Creatinine, Ser 0.84; Hemoglobin 12.6; Platelets 425; Potassium 3.7; Sodium 139    Lipid Panel     Component Value Date/Time   CHOL 118 03/20/2020 1118   TRIG 138 03/20/2020 1118   HDL 49 03/20/2020 1118   CHOLHDL 2.4 03/20/2020 1118   VLDL 28 03/20/2020 1118   LDLCALC 41 03/20/2020 1118     Wt Readings from Last 3 Encounters:  03/26/20 125 lb 9.6 oz (57 kg)  03/20/20 122 lb 3.2 oz (55.4 kg)  03/17/20 124 lb (56.2 kg)      Other studies Reviewed: Additional studies/ records that were reviewed today include: TTE 03/20/20  Review of the above records today demonstrates:  1. Left ventricular ejection fraction, by estimation, is 30 to 35%. The  left ventricle has moderately decreased function. The left ventricle  demonstrates regional wall motion abnormalities (see scoring  diagram/findings for description). The left  ventricular internal cavity size was moderately to severely dilated. There   is mild asymmetric left ventricular hypertrophy. Left ventricular  diastolic parameters are consistent with Grade II diastolic dysfunction  (pseudonormalization). There is severe  dyskinesis of the left ventricular, basal-mid inferior wall.  2. Right ventricular systolic function is normal. The right ventricular  size is normal. There is normal pulmonary artery  systolic pressure.  3. The mitral valve is grossly normal. No evidence of mitral valve  regurgitation.  4. The aortic valve is normal in structure. Aortic valve regurgitation is  not visualized.   Right and left heart catheterization 08/19/2019 1. Normal filling pressures.  2. Preserved cardiac output.  3. No obstructive CAD, patent stents.    ASSESSMENT AND PLAN:  1. Chronic systolic heart failure due to ischemic cardiomyopathy: Currently on optimal medical therapy with Coreg, Aldactone, [name]. Her ejection fraction has been reduced for quite some time. She would thus benefit from ICD implant. Risks and benefits of been discussed include bleeding, tamponade, infection, pneumothorax among others. She understands these risks and is agreed to the procedure.  2. Coronary artery disease: NSTEMI in 2017 with stents to the circumflex and RCA. No current chest pain. Plan per primary cardiology.  3. Tobacco abuse: Complete cessation encouraged  Case discussed with primary cardiology  Current medicines are reviewed at length with the patient today.   The patient does not have concerns regarding her medicines.  The following changes were made today:  none  Labs/ tests ordered today include:  No orders of the defined types were placed in this encounter.    Disposition:   FU with Chandy Tarman 3 months  Signed, Mariabelen Pressly Meredith Leeds, MD  03/26/2020 9:51 AM     [facility] [address] [place] [address] [phone] (office) [phone] (fax)

## 2020-03-26 NOTE — Patient Instructions (Signed)
Medication Instructions:  Your physician recommends that you continue on your current medications as directed. Please refer to the Current Medication list given to you today.  *If you need a refill on your cardiac medications before your next appointment, please call your pharmacy*   Lab Work: None ordered   Testing/Procedures: Your physician has recommended that you have a defibrillator inserted. An implantable cardioverter defibrillator (ICD) is a small device that is placed in your chest or, in rare cases, your abdomen. This device uses electrical pulses or shocks to help control life-threatening, irregular heartbeats that could lead the heart to suddenly stop beating (sudden cardiac arrest). Leads are attached to the ICD that goes into your heart. This is done in the hospital and usually requires an overnight stay.  Please see the instructions below located under "other instructions".   Follow-Up: At [facility], you and your health needs are our priority.  As part of our continuing mission to provide you with exceptional heart care, we have created designated Provider Care Teams.  These Care Teams include your primary Cardiologist (physician) and Advanced Practice Providers (APPs -  Physician Assistants and Nurse Practitioners) who all work together to provide you with the care you need, when you need it.  We recommend signing up for the patient portal called "MyChart".  Sign up information is provided on this After Visit Summary.  MyChart is used to connect with patients for Virtual Visits (Telemedicine).  Patients are able to view lab/test results, encounter notes, upcoming appointments, etc.  Non-urgent messages can be sent to your provider as well.   To learn more about what you can do with MyChart, go to [email or web address].    Your next appointment:   10 day(s) after your defibrillator implant  The format for your next appointment:   In Person  Provider:   device clinic  for wound check    Thank you for choosing [facility]!!   Trinidad Curet, RN [phone]   Other Instructions   Implantable Device Instructions  You are scheduled for: Implantable cardiac defibrillator on 04/03/20 with Dr. Curt Bears.  1.   Pre procedure testing-             COVID TEST-- On 04/01/20 @ 10:00 am - This is a Drive Up Visit at [address] [place], [place] [address].  Someone will direct you to the appropriate testing line. Stay in your car and someone will be with you shortly.   After you are tested please go home and self quarantine until the day of your procedure.    2. On the day of your procedure 04/03/2020 you will go to [facility] [phone] [address]) at 11:30 am.  Dennis Bast will go to the main entrance [address] [employer]) and enter where the [employer] are.  You will check in at ADMITTING.  You may have one support person come in to the hospital with you.  They will be asked to wait in the waiting room.   3.   Do not eat or drink after midnight prior to your procedure.   4.   On the morning of your procedure do NOT take any medication.  5.  The night before your procedure and the morning of your procedure scrub your neck/chest with surgical scrub.  An instruction letter is included with this below.   5.  Plan for an overnight stay.  If you use your phone frequently bring your phone charger.  When you  are discharged you will need someone to drive you home.   6.  You will follow up with the [place] clinic 10-14 days after your procedure. You will follow up with Dr. Curt Bears 91 days after your procedure.  These appointments will be made for you.   * If you have ANY questions after you get home, please call the office [phone] and ask for Ambrie Carte RN or send a MyChart message.   [place] - Preparing For Surgery (surgical scrub)  Before surgery, you can play an important role. Because skin is not sterile, your skin needs to be as free  of germs as possible. You can reduce the number of germs on your skin by washing with CHG (chlorahexidine gluconate) Soap before surgery.  CHG is an antiseptic cleaner which kills germs and bonds with the skin to continue killing germs even after washing.   Please do not use if you have an allergy to CHG or antibacterial soaps.  If your skin becomes reddened/irritated stop using the CHG.   Do not shave (including legs and underarms) for at least 48 hours prior to first CHG shower.  It is OK to shave your face.  Please follow these instructions carefully:  1.  Shower the night before surgery and the morning of surgery with CHG.  2.  If you choose to wash your hair, wash your hair first as usual with your normal shampoo.  3.  After you shampoo, rinse your hair and body thoroughly to remove the shampoo.  4.  Use CHG as you would any other liquid soap.  You can apply CHG directly to the skin and wash gently with a clean washcloth. 5.  Apply the CHG Soap to your body ONLY FROM THE NECK DOWN.  Do not use on open wounds or open sores.  Avoid contact with your eyes, ears, mouth and genitals (private parts).  Wash genitals (private parts) with your normal soap.  6.  Wash thoroughly, paying special attention to the area where your surgery will be performed.  7.  Thoroughly rinse your body with warm water from the neck down.   8.  DO NOT shower/wash with your normal soap after using and rinsing off the CHG soap.  9.  Pat yourself dry with a clean towel.           10.  Wear clean pajamas.           11.  Place clean sheets on your bed the night of your first shower and do not sleep with pets.  Day of Surgery: Do not apply any deodorants/lotions.  Please wear clean clothes to the hospital/surgery center.    Cardioverter Defibrillator Implantation  An implantable cardioverter defibrillator (ICD) is a small device that is placed under the skin in the chest or abdomen. An ICD consists of a battery, a small  computer (pulse generator), and wires (leads) that go into the heart. An ICD is used to detect and correct two types of dangerous irregular heartbeats (arrhythmias):  A rapid heart rhythm (tachycardia).  An arrhythmia in which the lower chambers of the heart (ventricles) contract in an uncoordinated way (fibrillation). When an ICD detects tachycardia, it sends a low-energy shock to the heart to restore the heartbeat to normal (cardioversion). This signal is usually painless. If cardioversion does not work or if the ICD detects fibrillation, it delivers a high-energy shock to the heart (defibrillation) to restart the heart. This shock may feel like a  strong jolt in the chest. Your health care provider may prescribe an ICD if:  You have had an arrhythmia that originated in the ventricles.  Your heart has been damaged by a disease or heart condition. Sometimes, ICDs are programmed to act as a device called a pacemaker. Pacemakers can be used to treat a slow heartbeat (bradycardia) or tachycardia by taking over the heart rate with electrical impulses. Tell a health care provider about:  Any allergies you have.  All medicines you are taking, including vitamins, herbs, eye drops, creams, and over-the-counter medicines.  Any problems you or family members have had with anesthetic medicines.  Any blood disorders you have.  Any surgeries you have had.  Any medical conditions you have.  Whether you are pregnant or may be pregnant. What are the risks? Generally, this is a safe procedure. However, problems may occur, including:  Swelling, bleeding, or bruising.  Infection.  Blood clots.  Damage to other structures or organs, such as nerves, blood vessels, or the heart.  Allergic reactions to medicines used during the procedure. What happens before the procedure? Staying hydrated Follow instructions from your health care provider about hydration, which may include:  Up to 2 hours  before the procedure - you may continue to drink clear liquids, such as water, clear fruit juice, black coffee, and plain tea. Eating and drinking restrictions Follow instructions from your health care provider about eating and drinking, which may include:  8 hours before the procedure - stop eating heavy meals or foods such as meat, fried foods, or fatty foods.  6 hours before the procedure - stop eating light meals or foods, such as toast or cereal.  6 hours before the procedure - stop drinking milk or drinks that contain milk.  2 hours before the procedure - stop drinking clear liquids. Medicine Ask your health care provider about:  Changing or stopping your normal medicines. This is important if you take diabetes medicines or blood thinners.  Taking medicines such as aspirin and ibuprofen. These medicines can thin your blood. Do not take these medicines before your procedure if your doctor tells you not to. Tests  You may have blood tests.  You may have a test to check the electrical signals in your heart (electrocardiogram, ECG).  You may have imaging tests, such as a chest X-ray. General instructions  For 24 hours before the procedure, stop using products that contain nicotine or tobacco, such as cigarettes and e-cigarettes. If you need help quitting, ask your health care provider.  Plan to have someone take you home from the hospital or clinic.  You may be asked to shower with a germ-killing soap. What happens during the procedure?  To reduce your risk of infection: ? Your health care team will wash or sanitize their hands. ? Your skin will be washed with soap. ? Hair may be removed from the surgical area.  Small monitors will be put on your body. They will be used to check your heart, blood pressure, and oxygen level.  An IV tube will be inserted into one of your veins.  You will be given one or more of the following: ? A medicine to help you relax (sedative). ? A  medicine to numb the area (local anesthetic). ? A medicine to make you fall asleep (general anesthetic).  Leads will be guided through a blood vessel into your heart and attached to your heart muscles. Depending on the ICD, the leads may go into one ventricle  or they may go into both ventricles and into an upper chamber of the heart. An X-ray machine (fluoroscope) will be usedto help guide the leads.  A small incision will be made to create a deep pocket under your skin.  The pulse generator will be placed into the pocket.  The ICD will be tested.  The incision will be closed with stitches (sutures), skin glue, or staples.  A bandage (dressing) will be placed over the incision. This procedure may vary among health care providers and hospitals. What happens after the procedure?  Your blood pressure, heart rate, breathing rate, and blood oxygen level will be monitored often until the medicines you were given have worn off.  A chest X-ray will be taken to check that the ICD is in the right place.  You will need to stay in the hospital for 1-2 days so your health care provider can make sure your ICD is working.  Do not drive for 24 hours if you received a sedative. Ask your health care provider when it is safe for you to drive.  You may be given an identification card explaining that you have an ICD. Summary  An implantable cardioverter defibrillator (ICD) is a small device that is placed under the skin in the chest or abdomen. It is used to detect and correct dangerous irregular heartbeats (arrhythmias).  An ICD consists of a battery, a small computer (pulse generator), and wires (leads) that go into the heart.  When an ICD detects rapid heart rhythm (tachycardia), it sends a low-energy shock to the heart to restore the heartbeat to normal (cardioversion). If cardioversion does not work or if the ICD detects uncoordinated heart contractions (fibrillation), it delivers a high-energy shock  to the heart (defibrillation) to restart the heart.  You will need to stay in the hospital for 1-2 days to make sure your ICD is working. This information is not intended to replace advice given to you by your health care provider. Make sure you discuss any questions you have with your health care provider. Document Revised: 04/07/2017 Document Reviewed: 05/04/2016 Elsevier Patient Education  2020 [name].

## 2020-03-26 NOTE — H&P (View-Only) (Signed)
Electrophysiology Office Note   Date:  03/26/2020   ID:  Erin [name], Erin [name] [birth year], MRN [id number]  PCP:  [name], MD  Cardiologist:  Aundra Dubin Primary Electrophysiologist:  Keeon Zurn Meredith Leeds, MD    Chief Complaint: CHF   History of Present Illness: Erin [name] is a 59 y.o. female who is being seen today for the evaluation of CHF at the request of Larey Dresser, MD. Presenting today for electrophysiology evaluation.  She has a history significant for coronary artery disease, ischemic cardiomyopathy, and peripheral arterial disease. She had a non-STEMI November 2017 with PCI to the circumflex and RCA. She subsequently developed an ischemic cardiomyopathy with an echo showing an ejection fraction of 30 to 35%. December 2020 she had embolization of a left posterior communicating artery aneurysm. She had a right and left heart catheterization done for chest heaviness that showed normal filling pressures preserved cardiac output and mild nonobstructive coronary artery disease. This was April 2021. That same month she had normal ABIs. August 2021 she had syncope which was thought related to orthostasis. Her Entresto dose was decreased.  Today, she denies symptoms of palpitations, chest pain, shortness of breath, orthopnea, PND, lower extremity edema, claudication, dizziness, presyncope, syncope, bleeding, or neurologic sequela. The patient is tolerating medications without difficulties.  Her main complaint today is fatigue and mild shortness of breath.  She states that this has been occurring since her heart failure diagnosis.  She does not notice any difference since starting her heart failure medications.   Past Medical History:  Diagnosis Date  . Anxiety state 03/25/2016  . Basal cell carcinoma of forehead   . Brain aneurysm   . CHF (congestive heart failure) ([place])   . Coronary artery disease    a. 03/11/16 PCI with DES-->Prox/Mid Cx;  b. 03/14/16 PCI with DES x2-->RCA, EF  30-35%.  . Essential hypertension   . GERD (gastroesophageal reflux disease)   . HFrEF (heart failure with reduced ejection fraction) ([place])    a. 10/2016 Echo: EF 35-40%, Gr1 DD, mild focal basal septal hypertrophy, basal inflat, mid inflat, basal antlat AK. Mid infept/inf/antlat, apical lateral sev HK. Mod MR. mildly reduced RV fxn. Mild TR.  [name] Kitchen History of pneumonia   . Hyperlipidemia   . IBS (irritable bowel syndrome)   . Ischemic cardiomyopathy    a. 10/2016 Echo: EF 35-40%, Gr1 DD.  [name] Kitchen Mitral regurgitation   . NSTEMI (non-ST elevated myocardial infarction) ([place]) 03/10/2016  . Pneumonia 03/2016  . Squamous cell cancer of skin of nose   . Thrombocytosis 03/26/2016  . Tobacco abuse   . Trichimoniasis   . Wears dentures   . Wears glasses    Past Surgical History:  Procedure Laterality Date  . APPENDECTOMY    . BIOPSY  09/20/2018   Procedure: BIOPSY;  Surgeon: Daneil Dolin, MD;  [facility];  Service: Endoscopy;;  colon  . CARDIAC CATHETERIZATION N/A 03/11/2016   Procedure: Left Heart Cath and Coronary Angiography;  Surgeon: Leonie Man, MD;  Location: [facility];  Service: Cardiovascular;  Laterality: N/A;  . CARDIAC CATHETERIZATION N/A 03/11/2016   Procedure: Coronary Stent Intervention;  Surgeon: Leonie Man, MD;  Location: [facility];  Service: Cardiovascular;  Laterality: N/A;  . CARDIAC CATHETERIZATION N/A 03/14/2016   Procedure: Coronary Stent Intervention;  Surgeon: Peter M Martinique, MD;  Location: [facility];  Service: Cardiovascular;  Laterality: N/A;  . CHOLECYSTECTOMY OPEN  1984  . COLONOSCOPY  WITH PROPOFOL N/A 09/20/2018   Procedure: COLONOSCOPY WITH PROPOFOL;  Surgeon: Daneil Dolin, MD;  [facility];  Service: Endoscopy;  Laterality: N/A;  10:30am  . CORONARY ANGIOPLASTY WITH STENT PLACEMENT  03/14/2016  . ESOPHAGOGASTRODUODENOSCOPY (EGD) WITH PROPOFOL N/A 09/20/2018   Procedure: ESOPHAGOGASTRODUODENOSCOPY (EGD) WITH  PROPOFOL;  Surgeon: Daneil Dolin, MD;  [facility];  Service: Endoscopy;  Laterality: N/A;  . FINGER ARTHROPLASTY Left 05/14/2013   Procedure: LEFT THUMB CARPAL METACARPAL ARTHROPLASTY;  Surgeon: Tennis Must, MD;  Location: [facility];  Service: Orthopedics;  Laterality: Left;  . IR ANGIO INTRA EXTRACRAN SEL COM CAROTID INNOMINATE BILAT MOD SED  01/05/2017  . IR ANGIO INTRA EXTRACRAN SEL COM CAROTID INNOMINATE BILAT MOD SED  03/19/2019  . IR ANGIO INTRA EXTRACRAN SEL INTERNAL CAROTID UNI L MOD SED  04/15/2019  . IR ANGIO VERTEBRAL SEL VERTEBRAL BILAT MOD SED  01/05/2017  . IR ANGIO VERTEBRAL SEL VERTEBRAL BILAT MOD SED  03/19/2019  . IR ANGIOGRAM FOLLOW UP STUDY  04/15/2019  . IR RADIOLOGIST EVAL & MGMT  12/30/2016  . IR TRANSCATH/EMBOLIZ  04/15/2019  . IR US GUIDE VASC ACCESS RIGHT  03/19/2019  . MALONEY DILATION N/A 09/20/2018   Procedure: Venia Minks DILATION;  Surgeon: Daneil Dolin, MD;  [facility];  Service: Endoscopy;  Laterality: N/A;  . RADIOLOGY WITH ANESTHESIA N/A 04/15/2019   Procedure: [employer];  Surgeon: Luanne Bras, MD;  Location: [facility];  Service: Radiology;  Laterality: N/A;  . RIGHT/LEFT HEART CATH AND CORONARY ANGIOGRAPHY N/A 08/19/2019   Procedure: RIGHT/LEFT HEART CATH AND CORONARY ANGIOGRAPHY;  Surgeon: Larey Dresser, MD;  Location: [facility];  Service: Cardiovascular;  Laterality: N/A;  . TUBAL LIGATION  1987  . VAGINAL HYSTERECTOMY  2009     Current Outpatient Medications  Medication Sig Dispense Refill  . acetaminophen (TYLENOL) 325 MG tablet Take 325 mg by mouth every 6 (six) hours as needed for mild pain or headache.     . ALPRAZolam (XANAX) 1 MG tablet Take 1-2 mg by mouth at bedtime.     [name] Kitchen aspirin EC 81 MG tablet Take 81 mg by mouth every evening.     [name] Kitchen atorvastatin (LIPITOR) 80 MG tablet TAKE 1 TABLET BY MOUTH ONCE DAILY AT  6  IN  THE  EVENING 90 tablet 2  . carvedilol (COREG) 6.25 MG tablet Take 0.5  tablets (3.125 mg total) by mouth 2 (two) times daily. 30 tablet 6  . clopidogrel (PLAVIX) 75 MG tablet Take 1 tablet by mouth once daily 60 tablet 3  . dapagliflozin propanediol (FARXIGA) 10 MG TABS tablet Take 10 mg by mouth daily before breakfast. 30 tablet 11  . ezetimibe (ZETIA) 10 MG tablet Take 1 tablet (10 mg total) by mouth daily. 90 tablet 1  . gabapentin (NEURONTIN) 100 MG capsule Take 100 mg by mouth 2 (two) times daily.     [name] Kitchen loperamide (IMODIUM) 2 MG capsule Take 2 mg by mouth 4 (four) times daily as needed for diarrhea or loose stools (ibs).     . LORazepam (ATIVAN) 1 MG tablet Take 0.5-1 mg by mouth daily.   1  . Multiple Vitamins-Minerals (MULTIVITAMIN WITH MINERALS) tablet Take 1 tablet by mouth daily.    . nitroGLYCERIN (NITROSTAT) 0.4 MG SL tablet Place 1 tablet (0.4 mg total) under the tongue every 5 (five) minutes x 3 doses as needed for chest pain. 30 tablet 12  . omeprazole (PRILOSEC)  20 MG capsule Take 20 mg by mouth daily.     [name] Kitchen rOPINIRole (REQUIP) 0.5 MG tablet Take 0.5 mg by mouth at bedtime.   0  . sacubitril-valsartan (ENTRESTO) 24-26 MG Take 1 tablet by mouth 2 (two) times daily. 60 tablet 11  . spironolactone (ALDACTONE) 25 MG tablet Take 0.5 tablets (12.5 mg total) by mouth daily. Resume in 3 days    . zinc gluconate 50 MG tablet Take 50 mg by mouth daily.     No current facility-administered medications for this visit.    Allergies:   Tape   Social History:  The patient  reports that she has been smoking cigarettes. She has a 1.50 pack-year smoking history. She has never used smokeless tobacco. She reports previous alcohol use. She reports previous drug use. Drug: Marijuana.   Family History:  The patient's family history includes Breast cancer in her maternal grandmother; CAD in her father; Colon polyps (age of onset: 64) in her father; Dementia in her father; Diabetes in her father and mother; Heart failure in an other family member; Hypertension in her  father and mother; Stroke in her father and mother.    ROS:  Please see the history of present illness.   Otherwise, review of systems is positive for none.   All other systems are reviewed and negative.    PHYSICAL EXAM: VS:  BP 110/72   Pulse 88   Ht 5' (1.524 m)   Wt 125 lb 9.6 oz (57 kg)   SpO2 98%   BMI 24.53 kg/m  , BMI Body mass index is 24.53 kg/m. GEN: Well nourished, well developed, in no acute distress  HEENT: normal  Neck: no JVD, carotid bruits, or masses Cardiac: RRR; no murmurs, rubs, or gallops,no edema  Respiratory:  clear to auscultation bilaterally, normal work of breathing GI: soft, nontender, nondistended, + BS MS: no deformity or atrophy  Skin: warm and dry Neuro:  Strength and sensation are intact Psych: euthymic mood, full affect  EKG:  EKG is not ordered today. Personal review of the ekg ordered 03/17/20 shows sinus rhythm, rate 96  Recent Labs: 12/10/2019: TSH 0.674 02/11/2020: B Natriuretic Peptide 134.0; Magnesium 2.5 03/17/2020: ALT 20; BUN 12; Creatinine, Ser 0.84; Hemoglobin 12.6; Platelets 425; Potassium 3.7; Sodium 139    Lipid Panel     Component Value Date/Time   CHOL 118 03/20/2020 1118   TRIG 138 03/20/2020 1118   HDL 49 03/20/2020 1118   CHOLHDL 2.4 03/20/2020 1118   VLDL 28 03/20/2020 1118   LDLCALC 41 03/20/2020 1118     Wt Readings from Last 3 Encounters:  03/26/20 125 lb 9.6 oz (57 kg)  03/20/20 122 lb 3.2 oz (55.4 kg)  03/17/20 124 lb (56.2 kg)      Other studies Reviewed: Additional studies/ records that were reviewed today include: TTE 03/20/20  Review of the above records today demonstrates:  1. Left ventricular ejection fraction, by estimation, is 30 to 35%. The  left ventricle has moderately decreased function. The left ventricle  demonstrates regional wall motion abnormalities (see scoring  diagram/findings for description). The left  ventricular internal cavity size was moderately to severely dilated. There   is mild asymmetric left ventricular hypertrophy. Left ventricular  diastolic parameters are consistent with Grade II diastolic dysfunction  (pseudonormalization). There is severe  dyskinesis of the left ventricular, basal-mid inferior wall.  2. Right ventricular systolic function is normal. The right ventricular  size is normal. There is normal pulmonary artery  systolic pressure.  3. The mitral valve is grossly normal. No evidence of mitral valve  regurgitation.  4. The aortic valve is normal in structure. Aortic valve regurgitation is  not visualized.   Right and left heart catheterization 08/19/2019 1. Normal filling pressures.  2. Preserved cardiac output.  3. No obstructive CAD, patent stents.    ASSESSMENT AND PLAN:  1. Chronic systolic heart failure due to ischemic cardiomyopathy: Currently on optimal medical therapy with Coreg, Aldactone, [name]. Her ejection fraction has been reduced for quite some time. She would thus benefit from ICD implant. Risks and benefits of been discussed include bleeding, tamponade, infection, pneumothorax among others. She understands these risks and is agreed to the procedure.  2. Coronary artery disease: NSTEMI in 2017 with stents to the circumflex and RCA. No current chest pain. Plan per primary cardiology.  3. Tobacco abuse: Complete cessation encouraged  Case discussed with primary cardiology  Current medicines are reviewed at length with the patient today.   The patient does not have concerns regarding her medicines.  The following changes were made today:  none  Labs/ tests ordered today include:  No orders of the defined types were placed in this encounter.    Disposition:   FU with Nil Bolser 3 months  Signed, Lorriann Hansmann Meredith Leeds, MD  03/26/2020 9:51 AM     [facility] [address] [place] [address] [phone] (office) [phone] (fax)

## 2020-04-01 ENCOUNTER — Other Ambulatory Visit (HOSPITAL_COMMUNITY)
Admission: RE | Admit: 2020-04-01 | Discharge: 2020-04-01 | Disposition: A | Discharge status: Home / Self Care | Payer: PRIVATE HEALTH INSURANCE | Source: Ambulatory Visit | Attending: Cardiology | Admitting: Cardiology

## 2020-04-01 ENCOUNTER — Telehealth: Payer: Self-pay | Admitting: Cardiology

## 2020-04-01 DIAGNOSIS — Z01818 Encounter for other preprocedural examination: Secondary | ICD-10-CM | POA: Insufficient documentation

## 2020-04-01 DIAGNOSIS — Z20822 Contact with and (suspected) exposure to covid-19: Secondary | ICD-10-CM | POA: Diagnosis not present

## 2020-04-01 LAB — SARS CORONAVIRUS 2 (TAT 6-24 HRS): SARS Coronavirus 2: NEGATIVE

## 2020-04-01 NOTE — Telephone Encounter (Signed)
[place] is calling stating she is returning a call from our office about canceling her upcoming procedure, but I was unable to find documentation in regards to this. Please advise.

## 2020-04-01 NOTE — Telephone Encounter (Signed)
Spoke to pt around 3:30 this afternoon.   Aware that I had billing speak w/ insurance again. Pt aware that ok to proceed with planned procedure Friday, insurance has approved. Pt appreciates my return call.

## 2020-04-01 NOTE — Progress Notes (Signed)
Attempted to call patient about procedure instructions for Friday.  Left voice mail on the following items: Arrival time 1130 Nothing to eat or drink after midnight No meds AM of procedure Responsible person to drive you home and stay with you for 24 hrs Wash with special soap night before and morning of procedure

## 2020-04-01 NOTE — Telephone Encounter (Signed)
Erin [name] is calling to state that she missed a call from our office saying we would need to cancel her upcoming procedure scheduled for Friday because insurance didn't give their approval - she states she called insurance and they did give the approval and the approval code they gave her is [id number]. Please call/advise

## 2020-04-03 ENCOUNTER — Ambulatory Visit (HOSPITAL_COMMUNITY): Payer: 59

## 2020-04-03 ENCOUNTER — Encounter (HOSPITAL_COMMUNITY)
Admission: RE | Disposition: A | Discharge status: Home / Self Care | Payer: Self-pay | Source: Home / Self Care | Attending: Cardiology

## 2020-04-03 ENCOUNTER — Other Ambulatory Visit: Payer: Self-pay

## 2020-04-03 ENCOUNTER — Ambulatory Visit (HOSPITAL_COMMUNITY)
Admission: RE | Admit: 2020-04-03 | Discharge: 2020-04-03 | Disposition: A | Discharge status: Home / Self Care | Payer: 59 | Attending: Cardiology | Admitting: Cardiology

## 2020-04-03 DIAGNOSIS — Z85828 Personal history of other malignant neoplasm of skin: Secondary | ICD-10-CM | POA: Insufficient documentation

## 2020-04-03 DIAGNOSIS — I2510 Atherosclerotic heart disease of native coronary artery without angina pectoris: Secondary | ICD-10-CM | POA: Insufficient documentation

## 2020-04-03 DIAGNOSIS — Z7982 Long term (current) use of aspirin: Secondary | ICD-10-CM | POA: Diagnosis not present

## 2020-04-03 DIAGNOSIS — I255 Ischemic cardiomyopathy: Secondary | ICD-10-CM | POA: Diagnosis not present

## 2020-04-03 DIAGNOSIS — Z955 Presence of coronary angioplasty implant and graft: Secondary | ICD-10-CM | POA: Diagnosis not present

## 2020-04-03 DIAGNOSIS — Z79899 Other long term (current) drug therapy: Secondary | ICD-10-CM | POA: Insufficient documentation

## 2020-04-03 DIAGNOSIS — Z7984 Long term (current) use of oral hypoglycemic drugs: Secondary | ICD-10-CM | POA: Insufficient documentation

## 2020-04-03 DIAGNOSIS — Z006 Encounter for examination for normal comparison and control in clinical research program: Secondary | ICD-10-CM | POA: Insufficient documentation

## 2020-04-03 DIAGNOSIS — Z95818 Presence of other cardiac implants and grafts: Secondary | ICD-10-CM

## 2020-04-03 DIAGNOSIS — Z7902 Long term (current) use of antithrombotics/antiplatelets: Secondary | ICD-10-CM | POA: Insufficient documentation

## 2020-04-03 DIAGNOSIS — F17210 Nicotine dependence, cigarettes, uncomplicated: Secondary | ICD-10-CM | POA: Diagnosis not present

## 2020-04-03 DIAGNOSIS — I5022 Chronic systolic (congestive) heart failure: Secondary | ICD-10-CM | POA: Insufficient documentation

## 2020-04-03 DIAGNOSIS — I110 Hypertensive heart disease with heart failure: Secondary | ICD-10-CM | POA: Diagnosis not present

## 2020-04-03 DIAGNOSIS — I252 Old myocardial infarction: Secondary | ICD-10-CM | POA: Insufficient documentation

## 2020-04-03 DIAGNOSIS — Z8249 Family history of ischemic heart disease and other diseases of the circulatory system: Secondary | ICD-10-CM | POA: Diagnosis not present

## 2020-04-03 HISTORY — PX: ICD IMPLANT: EP1208

## 2020-04-03 SURGERY — ICD IMPLANT

## 2020-04-03 MED ORDER — MIDAZOLAM HCL 5 MG/5ML IJ SOLN
INTRAMUSCULAR | Status: AC
  Filled 2020-04-03: qty 5

## 2020-04-03 MED ORDER — CEFAZOLIN SODIUM-DEXTROSE 1-4 GM/50ML-% IV SOLN
1.0000 g | Freq: Four times a day (QID) | INTRAVENOUS | Status: DC
  Filled 2020-04-03: qty 50

## 2020-04-03 MED ORDER — HEPARIN (PORCINE) IN NACL 1000-0.9 UT/500ML-% IV SOLN
INTRAVENOUS | Status: AC
  Filled 2020-04-03: qty 500

## 2020-04-03 MED ORDER — CEFAZOLIN SODIUM-DEXTROSE 2-4 GM/100ML-% IV SOLN
INTRAVENOUS | Status: AC
  Filled 2020-04-03: qty 100

## 2020-04-03 MED ORDER — LIDOCAINE HCL (PF) 1 % IJ SOLN
INTRAMUSCULAR | Status: DC | PRN
  Administered 2020-04-03: 60 mL

## 2020-04-03 MED ORDER — SODIUM CHLORIDE 0.9 % IV SOLN
80.0000 mg | INTRAVENOUS | Status: AC
  Administered 2020-04-03: 80 mg
  Filled 2020-04-03: qty 2

## 2020-04-03 MED ORDER — MIDAZOLAM HCL 5 MG/5ML IJ SOLN
INTRAMUSCULAR | Status: DC | PRN
  Administered 2020-04-03 (×2): 1 mg via INTRAVENOUS

## 2020-04-03 MED ORDER — LIDOCAINE HCL 1 % IJ SOLN
INTRAMUSCULAR | Status: AC
  Filled 2020-04-03: qty 60

## 2020-04-03 MED ORDER — SODIUM CHLORIDE 0.9 % IV SOLN
INTRAVENOUS | Status: AC
  Filled 2020-04-03: qty 2

## 2020-04-03 MED ORDER — CEFAZOLIN SODIUM-DEXTROSE 2-4 GM/100ML-% IV SOLN
2.0000 g | INTRAVENOUS | Status: AC
  Administered 2020-04-03: 2 g via INTRAVENOUS
  Filled 2020-04-03: qty 100

## 2020-04-03 MED ORDER — CHLORHEXIDINE GLUCONATE 4 % EX LIQD
4.0000 "application " | Freq: Once | CUTANEOUS | Status: DC
  Filled 2020-04-03: qty 60

## 2020-04-03 MED ORDER — ONDANSETRON HCL 4 MG/2ML IJ SOLN: 4.0000 mg | Freq: Four times a day (QID) | INTRAMUSCULAR | Status: DC | PRN

## 2020-04-03 MED ORDER — SODIUM CHLORIDE 0.9 % IV SOLN: INTRAVENOUS | Status: DC

## 2020-04-03 MED ORDER — ACETAMINOPHEN 325 MG PO TABS
325.0000 mg | ORAL_TABLET | ORAL | Status: DC | PRN
  Administered 2020-04-03: 650 mg via ORAL
  Filled 2020-04-03 (×3): qty 2

## 2020-04-03 MED ORDER — FENTANYL CITRATE (PF) 100 MCG/2ML IJ SOLN
INTRAMUSCULAR | Status: DC | PRN
  Administered 2020-04-03 (×2): 25 ug via INTRAVENOUS

## 2020-04-03 MED ORDER — FENTANYL CITRATE (PF) 100 MCG/2ML IJ SOLN
INTRAMUSCULAR | Status: AC
  Filled 2020-04-03: qty 2

## 2020-04-03 MED ORDER — HEPARIN (PORCINE) IN NACL 1000-0.9 UT/500ML-% IV SOLN
INTRAVENOUS | Status: DC | PRN
  Administered 2020-04-03: 500 mL

## 2020-04-03 MED ORDER — TRAMADOL HCL 50 MG PO TABS
50.0000 mg | ORAL_TABLET | Freq: Once | ORAL | Status: AC
  Administered 2020-04-03: 50 mg via ORAL
  Filled 2020-04-03: qty 1

## 2020-04-03 SURGICAL SUPPLY — 7 items
CABLE SURGICAL S-101-97-12 (CABLE) ×2 IMPLANT
ICD VISIA MRI VR DVFB1D4 (ICD Generator) ×1 IMPLANT
LEAD SPRINT QUAT SEC 6935M-62 (Lead) ×2 IMPLANT
PAD PRO RADIOLUCENT 2001M-C (PAD) ×2 IMPLANT
SHEATH 9FR PRELUDE SNAP 13 (SHEATH) ×2 IMPLANT
TRAY PACEMAKER INSERTION (PACKS) ×2 IMPLANT
VISIA MRI VR DVFB1D4 (ICD Generator) ×2 IMPLANT

## 2020-04-03 NOTE — Progress Notes (Signed)
Client cont c/o pacemaker site pain, Dr Curt Bears notified and order noted

## 2020-04-03 NOTE — Discharge Instructions (Addendum)
Cardioverter Defibrillator Implantation, Care After This sheet gives you information about how to care for yourself after your procedure. Your health care provider may also give you more specific instructions. If you have problems or questions, contact your health care provider. What can I expect after the procedure? After the procedure, it is common to have:  Some pain. It may last a few days.  A slight bump over the skin where the device was placed. Sometimes, it is possible to feel the device under the skin. This is normal. During the months and years after your procedure, your health care provider will check the device, the leads, and the battery every few months. Eventually, when the battery is low, the device will be replaced. Follow these instructions at home: Medicines  Take over-the-counter and prescription medicines only as told by your health care provider.  If you were prescribed an antibiotic medicine, take it as told by your health care provider. Do not stop taking the antibiotic even if you start to feel better. Incision care      Follow instructions from your health care provider about how to take care of your incision area. Make sure you: ? Wash your hands with soap and water before you change your bandage (dressing). If soap and water are not available, use hand sanitizer. ? Change your dressing as told by your health care provider. ? Leave stitches (sutures), skin glue, or adhesive strips in place. These skin closures may need to stay in place for 2 weeks or longer. If adhesive strip edges start to loosen and curl up, you may trim the loose edges. Do not remove adhesive strips completely unless your health care provider tells you to do that.  Check your incision area every day for signs of infection. Check for: ? More redness, swelling, or pain. ? More fluid or blood. ? Warmth. ? Pus or a bad smell.  Do not use lotions or ointments near the incision area unless told  by your health care provider.  Keep the incision area clean and dry for 2-3 days after the procedure or for as long as told by your health care provider. It takes several weeks for the incision site to heal completely.  Do not take baths, swim, or use a hot tub until your health care provider approves. Activity  Try to walk a little every day. Exercising is important after this procedure. Also, use your shoulder on the side of the defibrillator in daily tasks that do not require a lot of motion.  For at least 6 weeks: ? Do not lift your upper arm above your shoulders. This means no tennis, golf, or swimming for this period of time. If you tend to sleep with your arm above your head, use a restraint to prevent this during sleep. ? Avoid sudden jerking, pulling, or chopping movements that pull your upper arm far away from your body.  Ask your health care provider when you may go back to work.  Check with your health care provider before you start to drive or play sports. Electric and magnetic fields  Tell all health care providers that you have a defibrillator. This may prevent them from giving you an MRI scan because strong magnets are used for that test.  If you must pass through a metal detector, quickly walk through it. Do not stop under the detector, and do not stand near it.  Avoid places or objects that have a strong electric or magnetic field, including: ?  Airport Herbalist. At the airport, let officials know that you have a defibrillator. Your defibrillator ID card will let you be checked in a way that is safe for you and will not damage your defibrillator. Also, do not let a security person wave a magnetic wand near your defibrillator. That can make it stop working. ? Power plants. ? Large electrical generators. ? Anti-theft systems or electronic article surveillance (EAS). ? Radiofrequency transmission towers, such as cell phone and radio towers.  Do not use amateur (ham)  radio equipment or electric (arc) welding torches. Some devices are safe to use if held at least 12 inches (30 cm) from your defibrillator. These include power tools, lawn mowers, and speakers. If you are unsure if something is safe to use, ask your health care provider.  Do not use MP3 player headphones. They have magnets.  You may safely use electric blankets, heating pads, computers, and microwave ovens.  When using your cell phone, hold it to the ear that is on the opposite side from the defibrillator. Do not leave your cell phone in a pocket over the defibrillator. General instructions  Follow diet instructions from your health care provider, if this applies.  Always keep your defibrillator ID card with you. The card should list the implant date, device model, and manufacturer. Consider wearing a medical alert bracelet or necklace.  Have your defibrillator checked every 3-6 months or as often as told by your health care provider. Most defibrillators last for 4-8 years.  Keep all follow-up visits as told by your health care provider. This is important for your health care provider to make sure your chest is healing the way it should. Ask your health care provider when you should come back to have your stitches or staples taken out. Contact a health care provider if:  You feel one shock in your chest.  You gain weight suddenly.  Your legs or feet swell more than they have before.  It feels like your heart is fluttering or skipping beats (heart palpitations).  You have more redness, swelling, or pain around your incision.  You have more fluid or blood coming from your incision.  Your incision feels warm to the touch.  You have pus or a bad smell coming from your incision.  You have a fever. Get help right away if:  You have chest pain.  You feel more than one shock.  You feel more short of breath than you have felt before.  You feel more light-headed than you have felt  before.  Your incision starts to open up. This information is not intended to replace advice given to you by your health care provider. Make sure you discuss any questions you have with your health care provider. Document Revised: 07/20/2017 Document Reviewed: 09/30/2015 Elsevier Patient Education  [place].      After Your ICD (Implantable Cardiac Defibrillator)   . You have a Med[employer]onic ICD  ACTIVITY . Do not lift your arm above shoulder height for 1 week after your procedure. After 7 days, you may progress as below.  . You should remove your sling 24 hours after your procedure, unless otherwise instructed by your provider.     Friday April 10, 2020  Saturday April 11, 2020 Sunday April 12, 2020 Monday April 13, 2020   . Do not lift, push, pull, or carry anything over 10 pounds with the affected arm until 6 weeks (Friday May 15, 2020 ) after your procedure.   [name] Kitchen  Do NOT DRIVE until you have been seen for your wound check, or as long as instructed by your healthcare provider.   . Ask your healthcare provider when you can go back to work   INCISION/Dressing . If you are on a blood thinner such as Coumadin, Xarelto, Eliquis, Plavix, or Pradaxa please confirm with your provider when this should be resumed.   . Monitor your defibrillator site for redness, swelling, and drainage. Call the device clinic at [phone] if you experience these symptoms or fever/chills.  . If your incision is sealed with Steri-strips or staples, you may shower 10 days after your procedure or when told by your provider. Do not remove the steri-strips or let the shower hit directly on your site. You may wash around your site with soap and water.    [name] Kitchen Avoid lotions, ointments, or perfumes over your incision until it is well-healed.  . You may use a hot tub or a pool AFTER your wound check appointment if the incision is completely closed.  . Your ICD is designed to protect you from  life threatening heart rhythms. Because of this, you may receive a shock.   o 1 shock with no symptoms:  Call the office during business hours. o 1 shock with symptoms (chest pain, chest pressure, dizziness, lightheadedness, shortness of breath, overall feeling unwell):  Call 911. o If you experience 2 or more shocks in 24 hours:  Call 911. o If you receive a shock, you should not drive for 6 months per the [place] DMV IF you receive appropriate therapy from your ICD.   . ICD Alerts:  Some alerts are vibratory and others beep. These are NOT emergencies. Please call our office to let us know. If this occurs at night or on weekends, it can wait until the next business day. Send a remote transmission.  . If your device is capable of reading fluid status (for heart failure), you will be offered monthly monitoring to review this with you.   DEVICE MANAGEMENT . Remote monitoring is used to monitor your ICD from home. This monitoring is scheduled every 91 days by our office. It allows [place] to keep an eye on the functioning of your device to ensure it is working properly. You will routinely see your Electrophysiologist annually (more often if necessary).   . You should receive your ID card for your new device in 4-8 weeks. Keep this card with you at all times once received. Consider wearing a medical alert bracelet or necklace.  . Your ICD  may be MRI compatible. This will be discussed at your next office visit/wound check.  You should avoid contact with strong electric or magnetic fields.    Do not use amateur (ham) radio equipment or electric (arc) welding torches. MP3 player headphones with magnets should not be used. Some devices are safe to use if held at least 12 inches (30 cm) from your defibrillator. These include power tools, lawn mowers, and speakers. If you are unsure if something is safe to use, ask your health care provider.   When using your cell phone, hold it to the ear that is on the opposite  side from the defibrillator. Do not leave your cell phone in a pocket over the defibrillator.   You may safely use electric blankets, heating pads, computers, and microwave ovens.  Call the office right away if:  You have chest pain.  You feel more than one shock.  You feel more short of breath  than you have felt before.  You feel more light-headed than you have felt before.  Your incision starts to open up.  This information is not intended to replace advice given to you by your health care provider. Make sure you discuss any questions you have with your health care provider.

## 2020-04-03 NOTE — Progress Notes (Signed)
Dr Curt Bears in to see client

## 2020-04-03 NOTE — Interval H&P Note (Signed)
History and Physical Interval Note:  04/03/2020 11:32 AM  Erin [name]  has presented today for surgery, with the diagnosis of Cardiomyopathy.  The various methods of treatment have been discussed with the patient and family. After consideration of risks, benefits and other options for treatment, the patient has consented to  Procedure(s): ICD IMPLANT (N/A) as a surgical intervention.  The patient's history has been reviewed, patient examined, no change in status, stable for surgery.  I have reviewed the patient's chart and labs.  Questions were answered to the patient's satisfaction.     Fayola Meckes Meredith Leeds  ICD Criteria  Current LVEF:33%. Within 12 months prior to implant: Yes   Heart failure history: Yes, Class II  Cardiomyopathy history: Yes, Ischemic Cardiomyopathy - Prior MI.  Atrial Fibrillation/Atrial Flutter: No.  Ventricular tachycardia history: No.  Cardiac arrest history: No.  History of syndromes with risk of sudden death: No.  Previous ICD: No.  Current ICD indication: Primary  PPM indication: No.  Class I or II Bradycardia indication present: No  Beta Blocker therapy for 3 or more months: Yes, prescribed.   Ace Inhibitor/ARB therapy for 3 or more months: Yes, prescribed.    I have seen Erin [name] is a 59 y.o. femalepre-procedural and has been referred by Aundra Dubin for consideration of ICD implant for primary prevention of sudden death.  The patient's chart has been reviewed and they meet criteria for ICD implant.  I have had a thorough discussion with the patient reviewing options.  The patient and their family (if available) have had opportunities to ask questions and have them answered. The patient and I have decided together through the [facility] to implant ICD at this time.  Risks, benefits, alternatives to ICD implantation were discussed in detail with the patient today. The patient  understands that the risks include  but are not limited to bleeding, infection, pneumothorax, perforation, tamponade, vascular damage, renal failure, MI, stroke, death, inappropriate shocks, and lead dislodgement and wishes to proceed.

## 2020-04-04 ENCOUNTER — Telehealth: Payer: Self-pay | Admitting: Physician Assistant

## 2020-04-04 NOTE — Telephone Encounter (Signed)
Pt called stating the transmitter to her new ICD does not have signal and can't transmit. No answer when she called the device company. I relayed that her device is still functioning properly should she need it and she is ok to wait until Monday. I will send a message to device clinic to see if they can help her troubleshoot.

## 2020-04-06 ENCOUNTER — Telehealth: Payer: Self-pay

## 2020-04-06 ENCOUNTER — Encounter (HOSPITAL_COMMUNITY): Payer: Self-pay | Admitting: Cardiology

## 2020-04-06 NOTE — Telephone Encounter (Signed)
Patient is waiting for new wireless piece for home monitor and it will take 7 days.

## 2020-04-06 NOTE — Telephone Encounter (Signed)
Spoke with patient and her husband with tech support on the line. They are going to send patient new wireless piece for monitor so that they have connection. Patient is aware that it will take 7 days.

## 2020-04-08 ENCOUNTER — Ambulatory Visit (HOSPITAL_COMMUNITY)
Admission: RE | Admit: 2020-04-08 | Discharge: 2020-04-08 | Disposition: A | Discharge status: Home / Self Care | Payer: PRIVATE HEALTH INSURANCE | Source: Ambulatory Visit | Attending: Cardiology | Admitting: Cardiology

## 2020-04-08 ENCOUNTER — Other Ambulatory Visit: Payer: Self-pay

## 2020-04-08 DIAGNOSIS — I739 Peripheral vascular disease, unspecified: Secondary | ICD-10-CM | POA: Diagnosis present

## 2020-04-16 ENCOUNTER — Ambulatory Visit (INDEPENDENT_AMBULATORY_CARE_PROVIDER_SITE_OTHER): Payer: 59 | Admitting: Emergency Medicine

## 2020-04-16 ENCOUNTER — Other Ambulatory Visit: Payer: Self-pay

## 2020-04-16 DIAGNOSIS — I255 Ischemic cardiomyopathy: Secondary | ICD-10-CM

## 2020-04-16 LAB — CUP PACEART INCLINIC DEVICE CHECK
Battery Remaining Longevity: 137 mo
Battery Voltage: 3.17 V
Brady Statistic RV Percent Paced: 0 %
Date Time Interrogation Session: 20211209131550
HighPow Impedance: 70 Ohm
Implantable Lead Implant Date: 20211126
Implantable Lead Location: 753860
Implantable Pulse Generator Implant Date: 20211126
Lead Channel Impedance Value: 342 Ohm
Lead Channel Impedance Value: 399 Ohm
Lead Channel Pacing Threshold Amplitude: 1 V
Lead Channel Pacing Threshold Pulse Width: 0.4 ms
Lead Channel Sensing Intrinsic Amplitude: 12.25 mV
Lead Channel Sensing Intrinsic Amplitude: 5.625 mV
Lead Channel Setting Pacing Amplitude: 3.5 V
Lead Channel Setting Pacing Pulse Width: 0.4 ms
Lead Channel Setting Sensing Sensitivity: 0.3 mV

## 2020-04-16 NOTE — Progress Notes (Signed)
Wound check appointment. Steri-strips removed. Wound without redness or edema. Incision edges approximated, wound well healed. Normal device function. Thresholds, sensing, and impedances consistent with implant measurements. Device programmed at 3.5V for extra safety margin until 3 month visit. Histogram distribution appropriate for patient and level of activity. No ventricular arrhythmias noted. Patient educated about wound care, arm mobility, lifting restrictions, shock plan. Patient is enrolled in remote monitoring, next scheduled check 07/06/20, ROV with Dr. Curt Bears on 07/09/20 as scheduled.

## 2020-04-20 ENCOUNTER — Telehealth: Payer: Self-pay | Admitting: Cardiology

## 2020-04-20 DIAGNOSIS — Z0279 Encounter for issue of other medical certificate: Secondary | ICD-10-CM

## 2020-04-20 NOTE — Telephone Encounter (Addendum)
Medical Record's received Disability paperwork from Dr. Curt Bears completed. Patient is aware and would like it faxed to the fax number she has provided  [phone]. [email or web address] As per patient please mail original copy to her home address. 12/21[name] Medical Records received Disability Forms for Dr. Curt Bears to complete. Payment received and signed Release Form. 04/20/20 jc  Placed in Dr. Curt Bears Box. 04/20/20 jc

## 2020-05-07 ENCOUNTER — Other Ambulatory Visit (HOSPITAL_COMMUNITY): Payer: Self-pay | Admitting: Interventional Radiology

## 2020-05-07 ENCOUNTER — Telehealth (HOSPITAL_COMMUNITY): Payer: Self-pay

## 2020-05-07 DIAGNOSIS — I771 Stricture of artery: Secondary | ICD-10-CM

## 2020-05-07 NOTE — Telephone Encounter (Signed)
Called to schedule US carotid. Pt not available, will try back later. AW

## 2020-05-13 ENCOUNTER — Telehealth: Payer: Self-pay | Admitting: Cardiology
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
## 2020-05-14 ENCOUNTER — Ambulatory Visit (INDEPENDENT_AMBULATORY_CARE_PROVIDER_SITE_OTHER): Payer: 59 | Admitting: Emergency Medicine

## 2020-05-14 ENCOUNTER — Other Ambulatory Visit: Payer: Self-pay

## 2020-05-14 DIAGNOSIS — I255 Ischemic cardiomyopathy: Secondary | ICD-10-CM

## 2020-05-14 NOTE — Telephone Encounter (Signed)
Returning phone call. Appears a stitch is present. Patient denies any complaints to site such as drainage, swelling, fever or chills. Patient advised the area is tender when she touches it. Device Clinic apt. Made today at 3:30 to assess. Patient aware of location, date and time.

## 2020-05-14 NOTE — Progress Notes (Signed)
1 stitch removed from distal end of scar. No other concerns noted to scar. Advised patient if she sees additional suture to please call the device clinic and let us know. Patient agreeable to plan and verbalized understanding.

## 2020-05-14 NOTE — Telephone Encounter (Signed)
Patient reports a wire is sticking out. Denies being able to see pacemaker. States her skin is very thin. Requested patient send picture. States she will do that. Will call back after picture is received.

## 2020-05-14 NOTE — Patient Instructions (Signed)
Please call if you see any further stitch at your incision site. Called with any further questions or concerns.  Device Clinic [phone]

## 2020-05-14 NOTE — Telephone Encounter (Signed)
The patient states a little wire is poking through her chest. She wants to know if that is normal.

## 2020-05-20 ENCOUNTER — Ambulatory Visit (HOSPITAL_COMMUNITY)
Admission: RE | Admit: 2020-05-20 | Discharge: 2020-05-20 | Disposition: A | Discharge status: Home / Self Care | Payer: 59 | Source: Ambulatory Visit | Attending: Interventional Radiology | Admitting: Interventional Radiology

## 2020-05-20 ENCOUNTER — Other Ambulatory Visit: Payer: Self-pay

## 2020-05-20 DIAGNOSIS — I771 Stricture of artery: Secondary | ICD-10-CM | POA: Insufficient documentation

## 2020-05-20 NOTE — Progress Notes (Signed)
Carotid duplex has been completed.   Preliminary results in [facility].   Abram Sander 05/20/2020 1:39 PM

## 2020-05-26 ENCOUNTER — Telehealth (HOSPITAL_COMMUNITY): Payer: Self-pay

## 2020-05-26 NOTE — Telephone Encounter (Signed)
error 

## 2020-06-02 ENCOUNTER — Telehealth (HOSPITAL_COMMUNITY): Payer: Self-pay

## 2020-06-02 ENCOUNTER — Other Ambulatory Visit (HOSPITAL_COMMUNITY): Payer: Self-pay | Admitting: Interventional Radiology

## 2020-06-02 DIAGNOSIS — I771 Stricture of artery: Secondary | ICD-10-CM

## 2020-06-02 NOTE — Telephone Encounter (Signed)
Called to schedule angio, no answer, left vm. AW  

## 2020-06-03 ENCOUNTER — Other Ambulatory Visit: Payer: Self-pay | Admitting: Student

## 2020-06-03 NOTE — H&P (Signed)
Chief Complaint: Patient was seen in consultation today for internal carotid stenosis  Supervising Physician: Luanne Bras  Patient Status: [facility] - Out-pt  History of Present Illness: Erin [name] is a 60 y.o. female with past medical history of CHF, CAD, GERD, HLD, NSTEMI, ischemic cardiomyopathy with EF of 35-40%, and tobacco abuse is known to Neurointerventional Radiology for history of endovascular embolization of wide neck left internal carotid artery intracranial posterior communicating artery regiom aneurysm with pipeline flex flow diverter device 04/15/19.  Patient has since been followed with serial imaging which overall show stability until recent Carotid US 05/20/20. Carotid Doppler 05/20/20 showed: Right Carotid: Velocities in the right ICA are consistent with a 40-59%  stenosis.  Left Carotid: Velocities in the left ICA are consistent with a 60-79%  stenosis.   Erin [name] presents to [facility] today diagnostic angiogram based on her carotid doppler results. She continues smoking- 4 ciagrettes per week per her report.  Denies any new symptoms.  Denies headache, dizziness, blurry vision, trouble walking, weakness, tinnitus.   Past Medical History:  Diagnosis Date  . Anxiety state 03/25/2016  . Basal cell carcinoma of forehead   . Brain aneurysm   . CHF (congestive heart failure) ([place])   . Coronary artery disease    a. 03/11/16 PCI with DES-->Prox/Mid Cx;  b. 03/14/16 PCI with DES x2-->RCA, EF 30-35%.  . Essential hypertension   . GERD (gastroesophageal reflux disease)   . HFrEF (heart failure with reduced ejection fraction) ([place])    a. 10/2016 Echo: EF 35-40%, Gr1 DD, mild focal basal septal hypertrophy, basal inflat, mid inflat, basal antlat AK. Mid infept/inf/antlat, apical lateral sev HK. Mod MR. mildly reduced RV fxn. Mild TR.  [name] Kitchen History of pneumonia   . Hyperlipidemia   . IBS (irritable bowel syndrome)   . Ischemic cardiomyopathy    a. 10/2016 Echo: EF  35-40%, Gr1 DD.  [name] Kitchen Mitral regurgitation   . NSTEMI (non-ST elevated myocardial infarction) ([place]) 03/10/2016  . Pneumonia 03/2016  . Squamous cell cancer of skin of nose   . Thrombocytosis 03/26/2016  . Tobacco abuse   . Trichimoniasis   . Wears dentures   . Wears glasses     Past Surgical History:  Procedure Laterality Date  . APPENDECTOMY    . BIOPSY  09/20/2018   Procedure: BIOPSY;  Surgeon: Daneil Dolin, MD;  [facility];  Service: Endoscopy;;  colon  . CARDIAC CATHETERIZATION N/A 03/11/2016   Procedure: Left Heart Cath and Coronary Angiography;  Surgeon: Leonie Man, MD;  Location: [place] [facility];  Service: Cardiovascular;  Laterality: N/A;  . CARDIAC CATHETERIZATION N/A 03/11/2016   Procedure: Coronary Stent Intervention;  Surgeon: Leonie Man, MD;  Location: [place] [facility];  Service: Cardiovascular;  Laterality: N/A;  . CARDIAC CATHETERIZATION N/A 03/14/2016   Procedure: Coronary Stent Intervention;  Surgeon: Peter M Martinique, MD;  Location: [place] [facility] LAB;  Service: Cardiovascular;  Laterality: N/A;  . CHOLECYSTECTOMY OPEN  1984  . COLONOSCOPY WITH PROPOFOL N/A 09/20/2018   Procedure: COLONOSCOPY WITH PROPOFOL;  Surgeon: Daneil Dolin, MD;  [facility];  Service: Endoscopy;  Laterality: N/A;  10:30am  . CORONARY ANGIOPLASTY WITH STENT PLACEMENT  03/14/2016  . ESOPHAGOGASTRODUODENOSCOPY (EGD) WITH PROPOFOL N/A 09/20/2018   Procedure: ESOPHAGOGASTRODUODENOSCOPY (EGD) WITH PROPOFOL;  Surgeon: Daneil Dolin, MD;  [facility];  Service: Endoscopy;  Laterality: N/A;  . FINGER ARTHROPLASTY Left 05/14/2013   Procedure: LEFT THUMB  CARPAL METACARPAL ARTHROPLASTY;  Surgeon: Tennis Must, MD;  Location: [place];  Service: Orthopedics;  Laterality: Left;  . ICD IMPLANT N/A 04/03/2020   Procedure: ICD IMPLANT;  Surgeon: Constance Haw, MD;  Location: [place] [facility];  Service: Cardiovascular;  Laterality: N/A;   . IR ANGIO INTRA EXTRACRAN SEL COM CAROTID INNOMINATE BILAT MOD SED  01/05/2017  . IR ANGIO INTRA EXTRACRAN SEL COM CAROTID INNOMINATE BILAT MOD SED  03/19/2019  . IR ANGIO INTRA EXTRACRAN SEL INTERNAL CAROTID UNI L MOD SED  04/15/2019  . IR ANGIO VERTEBRAL SEL VERTEBRAL BILAT MOD SED  01/05/2017  . IR ANGIO VERTEBRAL SEL VERTEBRAL BILAT MOD SED  03/19/2019  . IR ANGIOGRAM FOLLOW UP STUDY  04/15/2019  . IR RADIOLOGIST EVAL & MGMT  12/30/2016  . IR TRANSCATH/EMBOLIZ  04/15/2019  . IR US GUIDE VASC ACCESS RIGHT  03/19/2019  . MALONEY DILATION N/A 09/20/2018   Procedure: Venia Minks DILATION;  Surgeon: Daneil Dolin, MD;  [facility];  Service: Endoscopy;  Laterality: N/A;  . RADIOLOGY WITH ANESTHESIA N/A 04/15/2019   Procedure: [employer];  Surgeon: Luanne Bras, MD;  Location: [place];  Service: Radiology;  Laterality: N/A;  . RIGHT/LEFT HEART CATH AND CORONARY ANGIOGRAPHY N/A 08/19/2019   Procedure: RIGHT/LEFT HEART CATH AND CORONARY ANGIOGRAPHY;  Surgeon: Larey Dresser, MD;  Location: [place] [facility];  Service: Cardiovascular;  Laterality: N/A;  . TUBAL LIGATION  1987  . VAGINAL HYSTERECTOMY  2009    Allergies: Tape  Medications: Prior to Admission medications   Medication Sig Start Date End Date Taking? Authorizing Provider  acetaminophen (TYLENOL) 325 MG tablet Take 325 mg by mouth every 6 (six) hours as needed for mild pain or headache.     [provider]  ALPRAZolam Duanne Moron) 1 MG tablet Take 1-2 mg by mouth at bedtime.    [provider]  aspirin EC 81 MG tablet Take 81 mg by mouth every evening.     [provider]  atorvastatin (LIPITOR) 80 MG tablet TAKE 1 TABLET BY MOUTH ONCE DAILY AT  6  IN  THE  EVENING Patient taking differently: Take 80 mg by mouth daily at 6 PM. 08/05/19   Strader, [place] M, PA-C  carvedilol (COREG) 6.25 MG tablet Take 0.5 tablets (3.125 mg total) by mouth 2 (two) times daily. 03/20/20   Larey Dresser, MD   clopidogrel (PLAVIX) 75 MG tablet Take 1 tablet by mouth once daily Patient taking differently: Take 75 mg by mouth daily. 10/08/19   Herminio Commons, MD  dapagliflozin propanediol (FARXIGA) 10 MG TABS tablet Take 10 mg by mouth daily before breakfast. 09/26/19   Larey Dresser, MD  ezetimibe (ZETIA) 10 MG tablet Take 1 tablet (10 mg total) by mouth daily. 01/06/20   Strader, Fransisco Hertz, PA-C  gabapentin (NEURONTIN) 100 MG capsule Take 100 mg by mouth 2 (two) times daily.     [provider]  loperamide (IMODIUM) 2 MG capsule Take 2 mg by mouth 4 (four) times daily as needed for diarrhea or loose stools (ibs).     [provider]  LORazepam (ATIVAN) 1 MG tablet Take 1 mg by mouth daily as needed for anxiety.  11/09/17   [provider]  Multiple Vitamins-Minerals (MULTIVITAMIN WITH MINERALS) tablet Take 1 tablet by mouth daily.    [provider]  nicotine (NICODERM CQ - DOSED IN MG/24 HOURS) 21 mg/24hr patch Place 21 mg onto the skin  at bedtime as needed.     [provider]  nitroGLYCERIN (NITROSTAT) 0.4 MG SL tablet Place 1 tablet (0.4 mg total) under the tongue every 5 (five) minutes x 3 doses as needed for chest pain. 03/05/18   Manuella Ghazi, Pratik D, DO  omeprazole (PRILOSEC) 20 MG capsule Take 20 mg by mouth daily.     [provider]  rOPINIRole (REQUIP) 0.5 MG tablet Take 0.5 mg by mouth at bedtime.  11/13/17   [provider]  sacubitril-valsartan (ENTRESTO) 24-26 MG Take 1 tablet by mouth 2 (two) times daily. 03/19/20   Larey Dresser, MD  spironolactone (ALDACTONE) 25 MG tablet Take 0.5 tablets (12.5 mg total) by mouth daily. Resume in 3 days Patient taking differently: Take 25 mg by mouth daily. 02/11/20   Kathie Dike, MD  zinc gluconate 50 MG tablet Take 50 mg by mouth daily.    [provider]     Family History  Problem Relation Age of Onset  . Diabetes Father   . Hypertension Father   . CAD Father   . Colon  polyps Father 60       pre-cancerous   . Stroke Father   . Dementia Father   . Stroke Mother   . Hypertension Mother   . Diabetes Mother   . Heart failure Other   . Breast cancer Maternal Grandmother   . Colon cancer Neg Hx     Social History   Socioeconomic History  . Marital status: Married    Spouse name: Not on file  . Number of children: Not on file  . Years of education: Not on file  . Highest education level: Not on file  Occupational History  . Occupation: CNA  Tobacco Use  . Smoking status: Current Some Day Smoker    Packs/day: 0.10    Years: 15.00    Pack years: 1.50    Types: Cigarettes  . Smokeless tobacco: Never Used  . Tobacco comment: smokes a cigarette occasionally  Vaping Use  . Vaping Use: Never used  Substance and Sexual Activity  . Alcohol use: Not Currently    Comment: occasionally  . Drug use: Not Currently    Types: Marijuana    Comment: former- 2017 last time  . Sexual activity: Not Currently    Birth control/protection: Surgical    Comment: hyst  Other Topics Concern  . Not on file  Social History Narrative   Lives with husband in [place] in a one story home with a basement.  Has 4 children.  Works as a Quarry manager.  Education: CNA school.    Social Determinants of Health   Financial Resource Strain: Not on file  Food Insecurity: Not on file  Transportation Needs: Not on file  Physical Activity: Not on file  Stress: Not on file  Social Connections: Not on file     Review of Systems: A 12 point ROS discussed and pertinent positives are indicated in the HPI above.  All other systems are negative.  Review of Systems  Constitutional: Negative for fatigue and fever.  Respiratory: Negative for cough and shortness of breath.   Cardiovascular: Negative for chest pain.  Gastrointestinal: Negative for abdominal pain.  Musculoskeletal: Negative for back pain and gait problem.  Neurological: Negative for dizziness, weakness, numbness and headaches.   Psychiatric/Behavioral: Negative for behavioral problems and confusion.    Vital Signs: BP 92/65   Pulse 86   Temp 98 F (36.7 C) (Oral)   Resp 16  Ht 5' (1.524 m)   Wt 126 lb (57.2 kg)   SpO2 100%   BMI 24.61 kg/m   Physical Exam Vitals and nursing note reviewed.  Constitutional:      Appearance: Normal appearance. She is not ill-appearing.  Cardiovascular:     Rate and Rhythm: Normal rate and regular rhythm.     Comments: Weak right radial pulse, 1+ Pulmonary:     Effort: Pulmonary effort is normal.     Breath sounds: Normal breath sounds.  Abdominal:     General: Abdomen is flat.     Palpations: Abdomen is soft.  Neurological:     General: No focal deficit present.     Mental Status: She is alert and oriented to person, place, and time. Mental status is at baseline.  Psychiatric:        Mood and Affect: Mood normal.        Behavior: Behavior normal.        Thought Content: Thought content normal.        Judgment: Judgment normal.      MD Evaluation Airway: WNL Heart: WNL Abdomen: WNL Chest/ Lungs: WNL ASA  Classification: 3 Mallampati/Airway Score: One   Imaging: VAS US CAROTID  Result Date: 05/20/2020 Carotid Arterial Duplex Study Indications:       Stenosis. Risk Factors:      Hypertension, hyperlipidemia, coronary artery disease. Comparison Study:  10/21/19 previous Performing Technologist: Abram Sander RVS  Examination Guidelines: A complete evaluation includes B-mode imaging, spectral Doppler, color Doppler, and power Doppler as needed of all accessible portions of each vessel. Bilateral testing is considered an integral part of a complete examination. Limited examinations for reoccurring indications may be performed as noted.  Right Carotid Findings: +----------+-------+-------+--------+---------------------------------+--------+           PSV    EDV    StenosisPlaque Description               Comments           cm/s   cm/s                                                      +----------+-------+-------+--------+---------------------------------+--------+ CCA Prox  58     24             heterogenous                              +----------+-------+-------+--------+---------------------------------+--------+ CCA Distal62     26             heterogenous                              +----------+-------+-------+--------+---------------------------------+--------+ ICA Prox  90     42     40-59%  heterogenous, calcific and                                                irregular                                 +----------+-------+-------+--------+---------------------------------+--------+  ICA Distal103    36                                                       +----------+-------+-------+--------+---------------------------------+--------+ ECA       89     20                                                       +----------+-------+-------+--------+---------------------------------+--------+ +----------+--------+-------+--------+-------------------+           PSV cm/sEDV cmsDescribeArm Pressure (mmHG) +----------+--------+-------+--------+-------------------+ [id number]                                         +----------+--------+-------+--------+-------------------+ +---------+--------+--+--------+--+---------+ VertebralPSV cm/s25EDV cm/s13Antegrade +---------+--------+--+--------+--+---------+  Left Carotid Findings: +----------+-------+-------+--------+---------------------------------+--------+           PSV    EDV    StenosisPlaque Description               Comments           cm/s   cm/s                                                     +----------+-------+-------+--------+---------------------------------+--------+ CCA Prox  70     27             heterogenous                              +----------+-------+-------+--------+---------------------------------+--------+  CCA Distal68     33             heterogenous                              +----------+-------+-------+--------+---------------------------------+--------+ ICA Prox  162    77     60-79%  heterogenous, irregular and                                               calcific                                  +----------+-------+-------+--------+---------------------------------+--------+ ICA Mid   141    61                                                       +----------+-------+-------+--------+---------------------------------+--------+ ICA Distal85     39                                                       +----------+-------+-------+--------+---------------------------------+--------+  ECA       88     11                                                       +----------+-------+-------+--------+---------------------------------+--------+ +----------+--------+--------+--------+-------------------+           PSV cm/sEDV cm/sDescribeArm Pressure (mmHG) +----------+--------+--------+--------+-------------------+ [id number]                                         +----------+--------+--------+--------+-------------------+ +---------+--------+--+--------+--+---------+ VertebralPSV cm/s43EDV cm/s18Antegrade +---------+--------+--+--------+--+---------+   Summary: Right Carotid: Velocities in the right ICA are consistent with a 40-59%                stenosis. Left Carotid: Velocities in the left ICA are consistent with a 60-79% stenosis. Vertebrals: Bilateral vertebral arteries demonstrate antegrade flow. *See table(s) above for measurements and observations.  Electronically signed by Curt Jews MD on 05/20/2020 at 2:37:56 PM.    Final     Labs:  CBC: Recent Labs    01/07/20 1816 02/11/20 0930 03/17/20 0711 06/04/20 0650  WBC 6.9 8.1 8.1 7.9  HGB 11.5* 11.7* 12.6 13.4  HCT 36.1 36.1 40.2 39.4  PLT 417* 398 425* 382    COAGS: Recent Labs     06/04/20 0650  INR 0.9  APTT 29    BMP: Recent Labs    11/12/19 1521 12/09/19 2105 12/11/19 0338 01/07/20 1816 02/11/20 0930 03/17/20 0711 06/04/20 0650  NA 140 138 138 140 138 139 138  K 4.3 4.2 4.0 4.0 4.0 3.7 4.4  CL 108 105 107 106 106 104 105  CO2 25 21* 22 22 24 24  21*  GLUCOSE 106* 96 80 92 92 111* 95  BUN 16 25* 17 15 15 12 15   CALCIUM 9.3 9.4 8.1* 9.4 9.0 9.9 9.3  CREATININE 0.84 1.16* 0.83 0.88 0.86 0.84 1.03*  GFRNONAA >60 52* >60 >60 >60 >60 >60  GFRAA >60 >60 >60 >60  --   --   --     LIVER FUNCTION TESTS: Recent Labs    12/10/19 0155 02/11/20 0930 03/17/20 0711  BILITOT 0.9 1.2 0.9  AST 26 20 24   ALT 20 17 20   ALKPHOS 56 58 71  PROT 6.7 6.1* 7.5  ALBUMIN 3.7 3.5 4.2    TUMOR MARKERS: No results for input(s): AFPTM, CEA, CA199, CHROMGRNA in the last 8760 hours.  Assessment and Plan: Patient with past medical history of wide neck intra-cranial aneurysm s/p embolization and pipeline flow diverter device placement presents with complaint of increased velocites by carotid doppler suggesting carotid stenosis.  Case reviewed by Dr. Estanislado Pandy who approves patient for diagnostic angiogram  Patient presents today in their usual state of health.  She has been NPO and is not currently on blood thinners.  She aware that femoral approach may be difficult, however neccessary if radial arteries are insufficient.   Risks and benefits of diagnostic angiogram were discussed with the patient including, but not limited to bleeding, infection, vascular injury or contrast induced renal failure.  This interventional procedure involves the use of X-rays and because of the nature of the planned procedure, it is possible that we will have prolonged use of X-ray fluoroscopy.  Potential radiation risks to you include (  but are not limited to) the following: - A slightly elevated risk for cancer  several years later in life. This risk is typically less than 0.5% percent. This  risk is low in comparison to the normal incidence of human cancer, which is 33% for women and 50% for men according to the [place]. - Radiation induced injury can include skin redness, resembling a rash, tissue breakdown / ulcers and hair loss (which can be temporary or permanent).   The likelihood of either of these occurring depends on the difficulty of the procedure and whether you are sensitive to radiation due to previous procedures, disease, or genetic conditions.   IF your procedure requires a prolonged use of radiation, you will be notified and given written instructions for further action.  It is your responsibility to monitor the irradiated area for the 2 weeks following the procedure and to notify your physician if you are concerned that you have suffered a radiation induced injury.    All of the patient's questions were answered, patient is agreeable to proceed.  Consent signed and in chart.  Thank you for this interesting consult.  I greatly enjoyed meeting Erin [name] and look forward to participating in their care.  A copy of this report was sent to the requesting provider on this date.  Electronically Signed: Docia Barrier, PA 06/04/2020, 9:08 AM   I spent a total of    25 Minutes in face to face in clinical consultation, greater than 50% of which was counseling/coordinating care for internal carotid stenosis.

## 2020-06-04 ENCOUNTER — Encounter (HOSPITAL_COMMUNITY): Payer: Self-pay

## 2020-06-04 ENCOUNTER — Ambulatory Visit (HOSPITAL_COMMUNITY)
Admission: RE | Admit: 2020-06-04 | Discharge: 2020-06-04 | Disposition: A | Discharge status: Home / Self Care | Payer: 59 | Source: Ambulatory Visit | Attending: Interventional Radiology | Admitting: Interventional Radiology

## 2020-06-04 ENCOUNTER — Other Ambulatory Visit: Payer: Self-pay

## 2020-06-04 ENCOUNTER — Other Ambulatory Visit (HOSPITAL_COMMUNITY): Payer: Self-pay | Admitting: Interventional Radiology

## 2020-06-04 DIAGNOSIS — Z7902 Long term (current) use of antithrombotics/antiplatelets: Secondary | ICD-10-CM | POA: Insufficient documentation

## 2020-06-04 DIAGNOSIS — Z79899 Other long term (current) drug therapy: Secondary | ICD-10-CM | POA: Diagnosis not present

## 2020-06-04 DIAGNOSIS — I771 Stricture of artery: Secondary | ICD-10-CM

## 2020-06-04 DIAGNOSIS — Z7982 Long term (current) use of aspirin: Secondary | ICD-10-CM | POA: Insufficient documentation

## 2020-06-04 DIAGNOSIS — I6522 Occlusion and stenosis of left carotid artery: Secondary | ICD-10-CM | POA: Insufficient documentation

## 2020-06-04 DIAGNOSIS — F17210 Nicotine dependence, cigarettes, uncomplicated: Secondary | ICD-10-CM | POA: Insufficient documentation

## 2020-06-04 HISTORY — PX: IR ANGIO INTRA EXTRACRAN SEL COM CAROTID INNOMINATE BILAT MOD SED: IMG5360

## 2020-06-04 HISTORY — PX: IR US GUIDE VASC ACCESS RIGHT: IMG2390

## 2020-06-04 HISTORY — PX: IR ANGIO VERTEBRAL SEL VERTEBRAL UNI L MOD SED: IMG5367

## 2020-06-04 LAB — CBC
HCT: 39.4 % (ref 36.0–46.0)
Hemoglobin: 13.4 g/dL (ref 12.0–15.0)
MCH: 29.6 pg (ref 26.0–34.0)
MCHC: 34 g/dL (ref 30.0–36.0)
MCV: 87.2 fL (ref 80.0–100.0)
Platelets: 382 10*3/uL (ref 150–400)
RBC: 4.52 MIL/uL (ref 3.87–5.11)
RDW: 15.9 % — ABNORMAL HIGH (ref 11.5–15.5)
WBC: 7.9 10*3/uL (ref 4.0–10.5)
nRBC: 0 % (ref 0.0–0.2)

## 2020-06-04 LAB — BASIC METABOLIC PANEL
Anion gap: 12 (ref 5–15)
BUN: 15 mg/dL (ref 6–20)
CO2: 21 mmol/L — ABNORMAL LOW (ref 22–32)
Calcium: 9.3 mg/dL (ref 8.9–10.3)
Chloride: 105 mmol/L (ref 98–111)
Creatinine, Ser: 1.03 mg/dL — ABNORMAL HIGH (ref 0.44–1.00)
GFR, Estimated: >60 mL/min (ref >60)
Glucose, Bld: 95 mg/dL (ref 70–99)
Potassium: 4.4 mmol/L (ref 3.5–5.1)
Sodium: 138 mmol/L (ref 135–145)

## 2020-06-04 LAB — PROTIME-INR
INR: 0.9 (ref 0.8–1.2)
Prothrombin Time: 12.1 seconds (ref 11.4–15.2)

## 2020-06-04 LAB — APTT: aPTT: 29 seconds (ref 24–36)

## 2020-06-04 MED ORDER — SODIUM CHLORIDE 0.9 % IV SOLN
INTRAVENOUS | Status: AC | PRN
  Administered 2020-06-04: 250 mL via INTRAVENOUS
  Administered 2020-06-04: 75 mL/h via INTRAVENOUS

## 2020-06-04 MED ORDER — FENTANYL CITRATE (PF) 100 MCG/2ML IJ SOLN
INTRAMUSCULAR | Status: AC | PRN
  Administered 2020-06-04 (×2): 12.5 ug via INTRAVENOUS

## 2020-06-04 MED ORDER — SODIUM CHLORIDE 0.9 % IV SOLN: INTRAVENOUS | Status: DC

## 2020-06-04 MED ORDER — HEPARIN SODIUM (PORCINE) 1000 UNIT/ML IJ SOLN
INTRAMUSCULAR | Status: AC
  Filled 2020-06-04: qty 1

## 2020-06-04 MED ORDER — NITROGLYCERIN 1 MG/10 ML FOR IR/CATH LAB
INTRA_ARTERIAL | Status: AC
  Filled 2020-06-04: qty 10

## 2020-06-04 MED ORDER — LIDOCAINE HCL 1 % IJ SOLN
INTRAMUSCULAR | Status: AC
  Administered 2020-06-04: 20 mL
  Filled 2020-06-04: qty 20

## 2020-06-04 MED ORDER — MIDAZOLAM HCL 2 MG/2ML IJ SOLN
INTRAMUSCULAR | Status: AC
  Filled 2020-06-04: qty 2

## 2020-06-04 MED ORDER — SODIUM CHLORIDE 0.9 % IV SOLN: INTRAVENOUS | Status: AC

## 2020-06-04 MED ORDER — VERAPAMIL HCL 2.5 MG/ML IV SOLN
INTRAVENOUS | Status: AC
  Filled 2020-06-04: qty 2

## 2020-06-04 MED ORDER — FENTANYL CITRATE (PF) 100 MCG/2ML IJ SOLN
INTRAMUSCULAR | Status: AC
  Filled 2020-06-04: qty 2

## 2020-06-04 MED ORDER — MIDAZOLAM HCL 2 MG/2ML IJ SOLN
INTRAMUSCULAR | Status: AC | PRN
  Administered 2020-06-04 (×2): 0.5 mg via INTRAVENOUS

## 2020-06-04 MED ORDER — HEPARIN SODIUM (PORCINE) 1000 UNIT/ML IJ SOLN
INTRAMUSCULAR | Status: AC | PRN
  Administered 2020-06-04: 1000 [IU] via INTRAVENOUS

## 2020-06-04 NOTE — Procedures (Signed)
S/P bilateral common carotid and Lt [facility] angiograms. RT CFA approach. Findings. 1.Approx 50 % LT ICA prox stenosis. S.Liahna Brickner MD

## 2020-06-04 NOTE — Discharge Instructions (Signed)
Angiogram, Care After °This sheet gives you information about how to care for yourself after your procedure. Your doctor may also give you more specific instructions. If you have problems or questions, contact your doctor. °What can I expect after the procedure? °After the procedure, it is common to have these problems at the point where the catheter was inserted: °· Bruising. °· Tenderness. °· A collection of blood (hematoma). This may feel like a small lump under the skin at the insertion site. °Follow these instructions at home: °Insertion site care °· Follow instructions from your doctor about how to take care of the area where the catheter was inserted. Make sure you: °? Wash your hands with soap and water before you change your bandage (dressing). If you cannot use soap and water, use hand sanitizer. °? Change your bandage as told by your doctor. °· Do not take baths, swim, or use a hot tub until your doctor says it is okay. °· You may shower 24-48 hours after the procedure, or as told by your doctor. To clean the area: °? Gently wash the area with plain soap and water. °? Pat the area dry with a clean towel. °? Do not rub the area. This may cause bleeding. °· Check your insertion area every day for signs of infection. Check for: °? Redness, swelling, or pain. °? Fluid or blood. °? Warmth. °? Pus or a bad smell. °· Do not apply powder or lotion to the area. Keep the area clean and dry.   °Activity °· Do not drive for 24 hours if you were given a medicine to help you relax (sedative). °· Rest as told by your doctor, usually for 1-2 days. °· Do not lift anything that is heavier than 10 lbs. (4.5 kg) or as told by your doctor. °· If your insertion site was in your leg, try to avoid stairs for a few days. °· Return to your normal activities as told by your doctor. Ask your doctor what activities are safe for you. °General instructions °· If the insertion area starts to bleed, lie flat and put pressure on the area.  If the bleeding does not stop, get help right away. This is an emergency. °· Take over-the-counter and prescription medicines only as told by your doctor. °· Drink enough fluid to keep your pee (urine) pale yellow. °· Keep all follow-up visits as told by your doctor. This is important.   °Contact a doctor if: °· You have a fever. °· You have chills. °· You have redness, swelling, or pain around your insertion area. °· You have fluid or blood coming from your insertion area. °· The insertion area feels warm to the touch. °· You have pus or a bad smell coming from your insertion area. °· You have more bruising around the insertion area. °Get help right away if: °· You have a lot of pain in the insertion area. °· The insertion area swells very fast. °· The insertion area is bleeding, and the bleeding does not stop after you hold steady pressure on the area. °· The area around the insertion area becomes pale, cool, tingly, or numb. °· You have chest pain. °· You have trouble breathing. °· You have a rash. °· You have any signs of a stroke. "BE FAST" is an easy way to remember the main warning signs: °? B - Balance. Signs are dizziness, sudden trouble walking, or loss of balance. °? E - Eyes. Signs are trouble seeing or a change   in how you see. ? F - Face. Signs are sudden weakness or loss of feeling of the face, or the face or eyelid drooping on one side. ? A - Arms. Signs are weakness or loss of feeling in an arm. This happens suddenly and usually on one side of the body. ? S - Speech. Signs are sudden trouble speaking, slurred speech, or trouble understanding what people say. ? T - Time. Time to call emergency services. Write down what time symptoms started.  You have other signs of a stroke, such as: ? A sudden, very bad headache with no known cause. ? Feeling like you may vomit (nausea). ? Vomiting. ? A seizure. These symptoms may be an emergency. Do not wait to see if the symptoms will go away. Get  medical help right away. Call your local emergency services (911 in the [place] Do not drive yourself to the hospital. Summary  After the procedure, it is common to have bruising and tenderness at the insertion area.  Do not take baths, swim, or use a hot tub until your doctor says it is okay to do so. You may shower 24-48 hours after the procedure or as told by your doctor.  It is important to rest and drink plenty of fluids.  If the insertion area starts to bleed, lie flat and put pressure on the area. If the bleeding does not stop, get help right away. This is an emergency. This information is not intended to replace advice given to you by your health care provider. Make sure you discuss any questions you have with your health care provider. Document Revised: 02/27/2019 Document Reviewed: 02/27/2019 Elsevier Patient Education  2021 [employer]. Moderate Conscious Sedation, Adult, Care After This sheet gives you information about how to care for yourself after your procedure. Your health care provider may also give you more specific instructions. If you have problems or questions, contact your health care provider. What can I expect after the procedure? After the procedure, it is common to have:  Sleepiness for several hours.  Impaired judgment for several hours.  Difficulty with balance.  Vomiting if you eat too soon. Follow these instructions at home: For the time period you were told by your health care provider:  Rest.  Do not participate in activities where you could fall or become injured.  Do not drive or use machinery.  Do not drink alcohol.  Do not take sleeping pills or medicines that cause drowsiness.  Do not make important decisions or sign legal documents.  Do not take care of children on your own.      Eating and drinking  Follow the diet recommended by your health care provider.  Drink enough fluid to keep your urine pale yellow.  If you  vomit: ? Drink water, juice, or soup when you can drink without vomiting. ? Make sure you have little or no nausea before eating solid foods.   General instructions  Take over-the-counter and prescription medicines only as told by your health care provider.  Have a responsible adult stay with you for the time you are told. It is important to have someone help care for you until you are awake and alert.  Do not smoke.  Keep all follow-up visits as told by your health care provider. This is important. Contact a health care provider if:  You are still sleepy or having trouble with balance after 24 hours.  You feel light-headed.  You keep feeling nauseous or you  keep vomiting.  You develop a rash.  You have a fever.  You have redness or swelling around the IV site. Get help right away if:  You have trouble breathing.  You have new-onset confusion at home. Summary  After the procedure, it is common to feel sleepy, have impaired judgment, or feel nauseous if you eat too soon.  Rest after you get home. Know the things you should not do after the procedure.  Follow the diet recommended by your health care provider and drink enough fluid to keep your urine pale yellow.  Get help right away if you have trouble breathing or new-onset confusion at home. This information is not intended to replace advice given to you by your health care provider. Make sure you discuss any questions you have with your health care provider. Document Revised: 08/23/2019 Document Reviewed: 03/21/2019 Elsevier Patient Education  2021 [name].

## 2020-06-23 ENCOUNTER — Ambulatory Visit (HOSPITAL_COMMUNITY)
Admission: RE | Admit: 2020-06-23 | Discharge: 2020-06-23 | Disposition: A | Discharge status: Home / Self Care | Payer: 59 | Source: Ambulatory Visit | Attending: Cardiology | Admitting: Cardiology

## 2020-06-23 ENCOUNTER — Other Ambulatory Visit: Payer: Self-pay

## 2020-06-23 ENCOUNTER — Encounter (HOSPITAL_COMMUNITY): Payer: Self-pay | Admitting: Cardiology

## 2020-06-23 VITALS — BP 120/84 | HR 78 | Wt 125.0 lb

## 2020-06-23 DIAGNOSIS — Z7982 Long term (current) use of aspirin: Secondary | ICD-10-CM | POA: Insufficient documentation

## 2020-06-23 DIAGNOSIS — I252 Old myocardial infarction: Secondary | ICD-10-CM | POA: Insufficient documentation

## 2020-06-23 DIAGNOSIS — R0602 Shortness of breath: Secondary | ICD-10-CM

## 2020-06-23 DIAGNOSIS — I255 Ischemic cardiomyopathy: Secondary | ICD-10-CM | POA: Insufficient documentation

## 2020-06-23 DIAGNOSIS — Z7902 Long term (current) use of antithrombotics/antiplatelets: Secondary | ICD-10-CM | POA: Insufficient documentation

## 2020-06-23 DIAGNOSIS — I5022 Chronic systolic (congestive) heart failure: Secondary | ICD-10-CM | POA: Diagnosis not present

## 2020-06-23 DIAGNOSIS — R059 Cough, unspecified: Secondary | ICD-10-CM

## 2020-06-23 DIAGNOSIS — Z955 Presence of coronary angioplasty implant and graft: Secondary | ICD-10-CM | POA: Insufficient documentation

## 2020-06-23 DIAGNOSIS — I2510 Atherosclerotic heart disease of native coronary artery without angina pectoris: Secondary | ICD-10-CM | POA: Diagnosis not present

## 2020-06-23 DIAGNOSIS — Z79899 Other long term (current) drug therapy: Secondary | ICD-10-CM | POA: Diagnosis not present

## 2020-06-23 DIAGNOSIS — I5042 Chronic combined systolic (congestive) and diastolic (congestive) heart failure: Secondary | ICD-10-CM

## 2020-06-23 DIAGNOSIS — F17210 Nicotine dependence, cigarettes, uncomplicated: Secondary | ICD-10-CM | POA: Diagnosis not present

## 2020-06-23 LAB — BASIC METABOLIC PANEL
Anion gap: 8 (ref 5–15)
BUN: 13 mg/dL (ref 6–20)
CO2: 23 mmol/L (ref 22–32)
Calcium: 9.4 mg/dL (ref 8.9–10.3)
Chloride: 108 mmol/L (ref 98–111)
Creatinine, Ser: 0.89 mg/dL (ref 0.44–1.00)
GFR, Estimated: >60 mL/min (ref >60)
Glucose, Bld: 98 mg/dL (ref 70–99)
Potassium: 4.3 mmol/L (ref 3.5–5.1)
Sodium: 139 mmol/L (ref 135–145)

## 2020-06-23 LAB — BRAIN NATRIURETIC PEPTIDE: B Natriuretic Peptide: 173.2 pg/mL — ABNORMAL HIGH (ref 0.0–100.0)

## 2020-06-23 MED ORDER — FUROSEMIDE 20 MG PO TABS: 20.0000 mg | ORAL_TABLET | ORAL | 3 refills | Status: DC

## 2020-06-23 MED ORDER — ENTRESTO 49-51 MG PO TABS: 1.0000 | ORAL_TABLET | Freq: Two times a day (BID) | ORAL | 3 refills | Status: DC

## 2020-06-23 NOTE — Progress Notes (Signed)
PCP: [name], MD HF Cardiology: [name]  60 y.o. with history of CAD, ischemic cardiomyopathy, and PAD was referred by [name] for evaluation of CHF.  She had NSTEMI in 11/17 with PCI to prox/mid LCx and RCA.  Subsequently, has developed ischemic cardiomyopathy.  Most recent echo in 1/21 showed EF 30-35%.  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   RHC/LHC done with exertional chest heaviness and dyspnea in 4/21 showed normal filling pressures, preserved cardiac output, and nonobstructive mild CAD.  ABIs were normal in 4/21.   In 8/21, she had syncope thought to be related to orthostasis from low BP in the setting of cardiac medication titration.  Entresto was decreased.   She was in the ER in 11/21 with chest pain.  Troponin and COVID-19 negative. Echo in 11/21 showed EF 30-35%, normal RV.   She returns for followup of CHF.  She has been off cigarette for 3 days now.  Using nicotine patches.  She has been more short of breath for several days.  She is short of breath walking down the hall in her house.  Poor appetite.  Chest feels heavy when she gets short of breath.  Last couple of days, she has been short of breath getting dressed.  She has had a nocturnal cough though she still sleeps on 1 pillow.     ECG (personally reviewed): NSR, normal  Labs (2/21): K 3.7, creatinine 0.82 Labs (4/21): K 4.3, creatinine 0.81, LDL 67, TGs 286 Labs (6/21): K 4.7, creatinine 1.22 Labs (11/21): K 3.7, creatinine 0.84, hgb 12.6, LDL 67, HDL 48, TGs 286 Labs (1/22). K 4.4, creatinine 1.03  Medtronic device interrogation: thoracic impedance trending down, no VT.   PMH: 1. CAD: NSTEMI with DES to proximal and mid LCx in 11/17, staged DES x 2 to RCA later in 11/17.  - Cardiolite (10/20): EF < 30%, large inferior and inferolateral MI with mild peri-infarct ischemia.  - LHC (4/21): Patent stents, nonobstructive CAD.  2. Chronic systolic CHF: Ischemic  cardiomyopathy. Medtronic ICD.  - Echo (10/20): EF 35-40%, lateral WMAs, normal RV. - Echo (1/21): EF 30-35%, mild LVH, normal RV - RHC (4/21): mean RA 5, PA 29/3, mean PCWP 12, CI 3.04 - Echo (11/21): EF 30-35%, normal RV.  3. 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  4. Carotid stenosis: Carotid dopplers (81/01) with 75-10% LICA stenosis.  - Carotid dopplers (6/21): 40-59% BICA stenosis.  - Carotid dopplers (1/22): 25-85% RICA, 27-78% LICA.  5. Active smoker.  6. PAD: ABIs (4/21) Normal.  - Peripheral artery dopplers (12/21): bilateral plaque without focal stenosis.   Social History   Socioeconomic History  . Marital status: Married    Spouse name: Not on file  . Number of children: Not on file  . Years of education: Not on file  . Highest education level: Not on file  Occupational History  . Occupation: CNA  Tobacco Use  . Smoking status: Current Some Day Smoker    Packs/day: 0.10    Years: 15.00    Pack years: 1.50    Types: Cigarettes  . Smokeless tobacco: Never Used  . Tobacco comment: smokes a cigarette occasionally  Vaping Use  . Vaping Use: Never used  Substance and Sexual Activity  . Alcohol use: Not Currently    Comment: occasionally  . Drug use: Not Currently    Types: Marijuana    Comment: former- 2017 last time  . Sexual  activity: Not Currently    Birth control/protection: Surgical    Comment: hyst  Other Topics Concern  . Not on file  Social History Narrative   Lives with husband in [place] in a one story home with a basement.  Has 4 children.  Works as a Quarry manager.  Education: CNA school.    Social Determinants of Health   Financial Resource Strain: Not on file  Food Insecurity: Not on file  Transportation Needs: Not on file  Physical Activity: Not on file  Stress: Not on file  Social Connections: Not on file  Intimate Partner Violence: Not on file   Family History  Problem Relation Age of Onset  . Diabetes Father   .  Hypertension Father   . CAD Father   . Colon polyps Father 60       pre-cancerous   . Stroke Father   . Dementia Father   . Stroke Mother   . Hypertension Mother   . Diabetes Mother   . Heart failure Other   . Breast cancer Maternal Grandmother   . Colon cancer Neg Hx    ROS: All systems reviewed and negative except as per HPI.   Current Outpatient Medications  Medication Sig Dispense Refill  . acetaminophen (TYLENOL) 325 MG tablet Take 325 mg by mouth every 6 (six) hours as needed for mild pain or headache.     . ALPRAZolam (XANAX) 1 MG tablet Take 1-2 mg by mouth at bedtime.    [name] Kitchen aspirin EC 81 MG tablet Take 81 mg by mouth every evening.     [name] Kitchen atorvastatin (LIPITOR) 80 MG tablet Take 80 mg by mouth every evening.    . carvedilol (COREG) 6.25 MG tablet Take 0.5 tablets (3.125 mg total) by mouth 2 (two) times daily. 30 tablet 6  . clopidogrel (PLAVIX) 75 MG tablet Take 1 tablet by mouth once daily 60 tablet 3  . dapagliflozin propanediol (FARXIGA) 10 MG TABS tablet Take 10 mg by mouth daily before breakfast. 30 tablet 11  . ezetimibe (ZETIA) 10 MG tablet Take 1 tablet (10 mg total) by mouth daily. 90 tablet 1  . furosemide (LASIX) 20 MG tablet Take 1 tablet (20 mg total) by mouth every other day. 45 tablet 3  . gabapentin (NEURONTIN) 100 MG capsule Take 100 mg by mouth 2 (two) times daily.     [name] Kitchen loperamide (IMODIUM) 2 MG capsule Take 2 mg by mouth 4 (four) times daily as needed for diarrhea or loose stools (ibs).     . LORazepam (ATIVAN) 1 MG tablet Take 1 mg by mouth daily as needed for anxiety.   1  . Multiple Vitamins-Minerals (MULTIVITAMIN WITH MINERALS) tablet Take 1 tablet by mouth daily.    . nicotine (NICODERM CQ - DOSED IN MG/24 HOURS) 21 mg/24hr patch Place 21 mg onto the skin at bedtime as needed.     . nitroGLYCERIN (NITROSTAT) 0.4 MG SL tablet Place 1 tablet (0.4 mg total) under the tongue every 5 (five) minutes x 3 doses as needed for chest pain. 30 tablet 12  .  omeprazole (PRILOSEC) 20 MG capsule Take 20 mg by mouth daily.     [name] Kitchen rOPINIRole (REQUIP) 0.5 MG tablet Take 0.5 mg by mouth at bedtime.   0  . sacubitril-valsartan (ENTRESTO) 49-51 MG Take 1 tablet by mouth 2 (two) times daily. 180 tablet 3  . spironolactone (ALDACTONE) 25 MG tablet Take 12.5 mg by mouth daily.    [name] Kitchen zinc gluconate 50 MG  tablet Take 50 mg by mouth daily.     No current facility-administered medications for this encounter.   BP 120/84   Pulse 78   Wt 56.7 kg (125 lb)   SpO2 98%   BMI 24.41 kg/m  General: NAD Neck: JVP 8-9 cm with HJR, no thyromegaly or thyroid nodule.  Lungs: Bilateral rhonchi CV: Nondisplaced PMI.  Heart regular S1/S2, no S3/S4, no murmur.  Trace ankle edema.  No carotid bruit.  Difficult to palpate pedal pulses.  Abdomen: Soft, nontender, no hepatosplenomegaly, no distention.  Skin: Intact without lesions or rashes.  Neurologic: Alert and oriented x 3.  Psych: Normal affect. Extremities: No clubbing or cyanosis.  HEENT: Normal.   Assessment/Plan: 1. CAD: S/p NSTEMI in 11/17 with DEX to LCx and DES x 2 to RCA.  Cardiolite in 10/20 with EF <30%, inferior/inferolateral infarct with peri-infarct ischemia. LHC in 4/21 showed nonobstructive mild CAD.  She has Medtronic ICD.  She reports chest heaviness associated with dyspnea, I think this is due to volume overload and not ischemia.  - Continue atorvastatin and Zetia. Good LDL 11/21.  - Continue ASA 81 and Plavix 75 mg daily for now.  She will likely be a good candidate to replace Plavix with rivaroxaban 2.5 mg bid in the future. She will continue Plavix as long as Dr. Estanislado Pandy feels it is needed post her cranial circulation intervention.  2. Chronic systolic CHF: Ischemic cardiomyopathy. Echo in 11/21 showed stable EF 30-35%.  RHC in 4/21 with normal filling pressures and preserved cardiac output.  NYHA class III symptoms.  She is volume overloaded on exam and by Optivol with weight up 3 lbs.  - Increase  Entresto to 49/51 bid. BMET today and in 10 days.  - Start Lasix 20 mg daily x 3 days then 20 mg every other day after that.  - Continue Coreg at 3.125 mg bid.     - Continue spironolactone 12.5 mg daily.  - Continue dapagliflozin 10 mg daily.   3. Carotid stenosis: Repeat carotid dopplers in 1/23.  4. Smoking: She has been off cigarettes for 3 days.  Using nicotine patch.  - I will get a CXR given coughing with h/o smoking.  5. 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   Followup with NP/PA in 3 wks to reassess volume.   Loralie Champagne 06/23/2020

## 2020-06-23 NOTE — Patient Instructions (Addendum)
Labs done today. We will contact you only if your labs are abnormal.  INCREASE Entresto to 49-51mg  (1 tablet) by mouth 2 times daily.  START Lasix 20mg  (1 tablet) by mouth daily for 3 days. THEN take 1 tablet by mouth every other day.  No other medication changes were made. Please continue all current medications as prescribed.  Your physician recommends that you schedule a follow-up appointment in: 10 days for a lab only appointment and in 3 weeks for an appointment here with our [facility].  Your provider has requested that you have a chest x-ray done today.A chest x-ray takes a picture of the organs and structures inside the chest, including the heart, lungs, and blood vessels. This test can show several things, including, whether the heart is enlarges; whether fluid is building up in the lungs; and whether pacemaker / defibrillator leads are still in place.  If you have any questions or concerns before your next appointment please send [place] a message through [place] or call our office at [phone].    TO LEAVE A MESSAGE FOR THE NURSE SELECT OPTION 2, PLEASE LEAVE A MESSAGE INCLUDING: . YOUR NAME . DATE OF BIRTH . CALL BACK NUMBER . REASON FOR CALL**this is important as we prioritize the call backs  YOU WILL RECEIVE A CALL BACK THE SAME DAY AS LONG AS YOU CALL BEFORE 4:00 PM   Do the following things EVERYDAY: 1) Weigh yourself in the morning before breakfast. Write it down and keep it in a log. 2) Take your medicines as prescribed 3) Eat low salt foods--Limit salt (sodium) to 2000 mg per day.  4) Stay as active as you can everyday 5) Limit all fluids for the day to less than 2 liters   At the [facility], you and your health needs are our priority. As part of our continuing mission to provide you with exceptional heart care, we have created designated Provider Care Teams. These Care Teams include your primary Cardiologist (physician) and Advanced Practice  Providers (APPs- Physician Assistants and Nurse Practitioners) who all work together to provide you with the care you need, when you need it.   You may see any of the following providers on your designated Care Team at your next follow up: [name] Kitchen Dr Glori Bickers . Dr Loralie Champagne . Darrick Grinder, NP . Lyda Jester, PA . [name], PharmD   Please be sure to bring in all your medications bottles to every appointment.

## 2020-06-26 ENCOUNTER — Telehealth (HOSPITAL_COMMUNITY): Payer: Self-pay

## 2020-06-26 NOTE — Telephone Encounter (Signed)
-----   Message from Larey Dresser, MD sent at 06/24/2020  4:43 PM EST ----- No acute abnormalities.

## 2020-06-26 NOTE — Telephone Encounter (Signed)
Patient aware.

## 2020-06-27 ENCOUNTER — Emergency Department (HOSPITAL_COMMUNITY)
Admission: EM | Admit: 2020-06-27 | Discharge: 2020-06-27 | Disposition: A | Discharge status: Home / Self Care | Payer: 59 | Attending: Emergency Medicine | Admitting: Emergency Medicine

## 2020-06-27 ENCOUNTER — Emergency Department (HOSPITAL_COMMUNITY): Payer: 59

## 2020-06-27 ENCOUNTER — Encounter (HOSPITAL_COMMUNITY): Payer: Self-pay

## 2020-06-27 ENCOUNTER — Other Ambulatory Visit: Payer: Self-pay

## 2020-06-27 DIAGNOSIS — J40 Bronchitis, not specified as acute or chronic: Secondary | ICD-10-CM | POA: Insufficient documentation

## 2020-06-27 DIAGNOSIS — Z20822 Contact with and (suspected) exposure to covid-19: Secondary | ICD-10-CM | POA: Insufficient documentation

## 2020-06-27 DIAGNOSIS — Z79899 Other long term (current) drug therapy: Secondary | ICD-10-CM | POA: Insufficient documentation

## 2020-06-27 DIAGNOSIS — Z85828 Personal history of other malignant neoplasm of skin: Secondary | ICD-10-CM | POA: Diagnosis not present

## 2020-06-27 DIAGNOSIS — R072 Precordial pain: Secondary | ICD-10-CM | POA: Diagnosis present

## 2020-06-27 DIAGNOSIS — Z7982 Long term (current) use of aspirin: Secondary | ICD-10-CM | POA: Diagnosis not present

## 2020-06-27 DIAGNOSIS — I5042 Chronic combined systolic (congestive) and diastolic (congestive) heart failure: Secondary | ICD-10-CM | POA: Insufficient documentation

## 2020-06-27 DIAGNOSIS — I110 Hypertensive heart disease with heart failure: Secondary | ICD-10-CM | POA: Diagnosis not present

## 2020-06-27 DIAGNOSIS — I2510 Atherosclerotic heart disease of native coronary artery without angina pectoris: Secondary | ICD-10-CM | POA: Insufficient documentation

## 2020-06-27 DIAGNOSIS — F17210 Nicotine dependence, cigarettes, uncomplicated: Secondary | ICD-10-CM | POA: Diagnosis not present

## 2020-06-27 LAB — TROPONIN I (HIGH SENSITIVITY)
Troponin I (High Sensitivity): 7 ng/L (ref <18)
Troponin I (High Sensitivity): 7 ng/L (ref <18)

## 2020-06-27 LAB — BASIC METABOLIC PANEL
Anion gap: 9 (ref 5–15)
BUN: 15 mg/dL (ref 6–20)
CO2: 25 mmol/L (ref 22–32)
Calcium: 9.8 mg/dL (ref 8.9–10.3)
Chloride: 104 mmol/L (ref 98–111)
Creatinine, Ser: 0.97 mg/dL (ref 0.44–1.00)
GFR, Estimated: >60 mL/min (ref >60)
Glucose, Bld: 106 mg/dL — ABNORMAL HIGH (ref 70–99)
Potassium: 4.6 mmol/L (ref 3.5–5.1)
Sodium: 138 mmol/L (ref 135–145)

## 2020-06-27 LAB — CBC
HCT: 40.9 % (ref 36.0–46.0)
Hemoglobin: 13.1 g/dL (ref 12.0–15.0)
MCH: 28.7 pg (ref 26.0–34.0)
MCHC: 32 g/dL (ref 30.0–36.0)
MCV: 89.7 fL (ref 80.0–100.0)
Platelets: 396 10*3/uL (ref 150–400)
RBC: 4.56 MIL/uL (ref 3.87–5.11)
RDW: 16.2 % — ABNORMAL HIGH (ref 11.5–15.5)
WBC: 7.1 10*3/uL (ref 4.0–10.5)
nRBC: 0 % (ref 0.0–0.2)

## 2020-06-27 LAB — BRAIN NATRIURETIC PEPTIDE: B Natriuretic Peptide: 72 pg/mL (ref 0.0–100.0)

## 2020-06-27 MED ORDER — PREDNISONE 50 MG PO TABS
60.0000 mg | ORAL_TABLET | Freq: Once | ORAL | Status: AC
  Administered 2020-06-27: 60 mg via ORAL
  Filled 2020-06-27: qty 1

## 2020-06-27 MED ORDER — ALBUTEROL SULFATE HFA 108 (90 BASE) MCG/ACT IN AERS
2.0000 | INHALATION_SPRAY | Freq: Once | RESPIRATORY_TRACT | Status: AC
  Administered 2020-06-27: 2 via RESPIRATORY_TRACT
  Filled 2020-06-27: qty 6.7

## 2020-06-27 MED ORDER — AEROCHAMBER Z-STAT PLUS/MEDIUM MISC
1.0000 | Freq: Once | Status: AC
  Administered 2020-06-27: 1

## 2020-06-27 MED ORDER — PREDNISONE 50 MG PO TABS: ORAL_TABLET | ORAL | 0 refills | Status: DC

## 2020-06-27 MED ORDER — BENZONATATE 100 MG PO CAPS
200.0000 mg | ORAL_CAPSULE | Freq: Once | ORAL | Status: AC
  Administered 2020-06-27: 200 mg via ORAL
  Filled 2020-06-27: qty 2

## 2020-06-27 MED ORDER — BENZONATATE 100 MG PO CAPS: 200.0000 mg | ORAL_CAPSULE | Freq: Three times a day (TID) | ORAL | 0 refills | Status: DC | PRN

## 2020-06-27 NOTE — ED Triage Notes (Signed)
Pt to er, pt states that she was awakened from sleep with some chest pain, states that she also has a headache and nausea.  States that nothing seems to make her pain better or worse, states that she also has a lot of coughing, pt has a wet cough.  Pt states that she has been around people who have covid, states that she has her vaccine and also her booster.  States that she has had the cough for about a week and a half.

## 2020-06-27 NOTE — Discharge Instructions (Addendum)
Use the inhaler you were given taking 2 puffs every 4 hours if you are wheezing, this will also help reduce the frequency of coughing.  Take your next dose of prednisone tomorrow, this is an anti-inflammatory which will also help open your airways and help you get over the wheezing.  Rest make sure you are drinking plenty of fluids.  Get rechecked for any worsening symptoms including increased shortness of breath or if you develop fever or any new complaints.  As discussed your lab tests and chest x-ray are reassuring tonight, you do not have an exacerbation of your CHF and there is no evidence that your symptoms are of a cardiac source.

## 2020-06-27 NOTE — ED Provider Notes (Signed)
[facility] EMERGENCY DEPARTMENT Provider Note   CSN: [id number] Arrival date & time: 06/27/20  1320     History Chief Complaint  Patient presents with  . Chest Pain    Erin [name] is a 60 y.o. female history significant for CAD with PCI, ischemic cardiomyopathy, peripheral arterial disease and history of CHF presenting with complaint of midsternal chest pressure in association with persistent cough.  She describes a wet sounding cough which is nonproductive which is been present for about a week and a half.  She denies fevers or chills, she also denies shortness of breath but has had increased wheezing.  She has been around several people who have tested positive for Covid, but patient is fully vaccinated for this infection.  She also endorses generalized headache and nausea without emesis.  She denies diaphoresis, palpitations, also no peripheral edema or orthopnea.  She does not feel her symptoms are not related to a cardiac source, feels her chest discomfort is secondary to the frequency of cough.  She tried taking Mucinex but that seemed to make her jittery so she switched to DayQuil which does improve her cough, only transiently.     The history is provided by the patient.       Past Medical History:  Diagnosis Date  . Anxiety state 03/25/2016  . Basal cell carcinoma of forehead   . Brain aneurysm   . CHF (congestive heart failure) ([place])   . Coronary artery disease    a. 03/11/16 PCI with DES-->Prox/Mid Cx;  b. 03/14/16 PCI with DES x2-->RCA, EF 30-35%.  . Essential hypertension   . GERD (gastroesophageal reflux disease)   . HFrEF (heart failure with reduced ejection fraction) ([place])    a. 10/2016 Echo: EF 35-40%, Gr1 DD, mild focal basal septal hypertrophy, basal inflat, mid inflat, basal antlat AK. Mid infept/inf/antlat, apical lateral sev HK. Mod MR. mildly reduced RV fxn. Mild TR.  [name] Kitchen History of pneumonia   . Hyperlipidemia   . IBS (irritable bowel syndrome)   .  Ischemic cardiomyopathy    a. 10/2016 Echo: EF 35-40%, Gr1 DD.  [name] Kitchen Mitral regurgitation   . NSTEMI (non-ST elevated myocardial infarction) ([place]) 03/10/2016  . Pneumonia 03/2016  . Squamous cell cancer of skin of nose   . Thrombocytosis 03/26/2016  . Tobacco abuse   . Trichimoniasis   . Wears dentures   . Wears glasses     Patient Active Problem List   Diagnosis Date Noted  . Cough 02/11/2020  . Near syncope 12/10/2019  . Brain aneurysm 04/15/2019  . Dysphagia 02/27/2018  . Encounter for screening colonoscopy 02/27/2018  . History of Clostridium difficile infection 02/27/2018  . Frequent stools 12/20/2017  . Chronic combined systolic and diastolic CHF (congestive heart failure) ([place]) 06/20/2016  . Hypokalemia   . Acute CHF (congestive heart failure) ([place]) 05/16/2016  . Acute on chronic systolic CHF (congestive heart failure) ([place]) 05/16/2016  . Acute respiratory failure with hypoxia ([place])   . Thrombocytosis 03/26/2016  . Cardiomyopathy, ischemic 03/25/2016  . Chronic combined systolic and diastolic heart failure ([place]) 03/25/2016  . Anxiety state 03/25/2016  . Troponin level elevated 03/25/2016  . CAD (coronary artery disease), native coronary artery 03/25/2016  . Normocytic anemia 03/25/2016  . SOB (shortness of breath) 03/24/2016  . Lightheadedness 03/17/2016  . Hypotension 03/17/2016  . Tobacco abuse 03/12/2016  . NSTEMI (non-ST elevated myocardial infarction) ([place]) 03/11/2016  . Atypical chest pain   . Essential hypertension 09/06/2015  . Mixed hyperlipidemia 09/06/2015  .  GERD (gastroesophageal reflux disease) 09/06/2015  . Chest pain 09/06/2015    Past Surgical History:  Procedure Laterality Date  . APPENDECTOMY    . BIOPSY  09/20/2018   Procedure: BIOPSY;  Surgeon: Daneil Dolin, MD;  [facility];  Service: Endoscopy;;  colon  . CARDIAC CATHETERIZATION N/A 03/11/2016   Procedure: Left Heart Cath and Coronary Angiography;  Surgeon: Leonie Man,  MD;  Location: [place] [facility];  Service: Cardiovascular;  Laterality: N/A;  . CARDIAC CATHETERIZATION N/A 03/11/2016   Procedure: Coronary Stent Intervention;  Surgeon: Leonie Man, MD;  Location: [place] [facility];  Service: Cardiovascular;  Laterality: N/A;  . CARDIAC CATHETERIZATION N/A 03/14/2016   Procedure: Coronary Stent Intervention;  Surgeon: Peter M Martinique, MD;  Location: [place] [facility];  Service: Cardiovascular;  Laterality: N/A;  . CHOLECYSTECTOMY OPEN  1984  . COLONOSCOPY WITH PROPOFOL N/A 09/20/2018   Procedure: COLONOSCOPY WITH PROPOFOL;  Surgeon: Daneil Dolin, MD;  [facility];  Service: Endoscopy;  Laterality: N/A;  10:30am  . CORONARY ANGIOPLASTY WITH STENT PLACEMENT  03/14/2016  . ESOPHAGOGASTRODUODENOSCOPY (EGD) WITH PROPOFOL N/A 09/20/2018   Procedure: ESOPHAGOGASTRODUODENOSCOPY (EGD) WITH PROPOFOL;  Surgeon: Daneil Dolin, MD;  [facility];  Service: Endoscopy;  Laterality: N/A;  . FINGER ARTHROPLASTY Left 05/14/2013   Procedure: LEFT THUMB CARPAL METACARPAL ARTHROPLASTY;  Surgeon: Tennis Must, MD;  Location: [place];  Service: Orthopedics;  Laterality: Left;  . ICD IMPLANT N/A 04/03/2020   Procedure: ICD IMPLANT;  Surgeon: Constance Haw, MD;  Location: [place] [facility];  Service: Cardiovascular;  Laterality: N/A;  . IR ANGIO INTRA EXTRACRAN SEL COM CAROTID INNOMINATE BILAT MOD SED  01/05/2017  . IR ANGIO INTRA EXTRACRAN SEL COM CAROTID INNOMINATE BILAT MOD SED  03/19/2019  . IR ANGIO INTRA EXTRACRAN SEL COM CAROTID INNOMINATE BILAT MOD SED  06/04/2020  . IR ANGIO INTRA EXTRACRAN SEL INTERNAL CAROTID UNI L MOD SED  04/15/2019  . IR ANGIO VERTEBRAL SEL VERTEBRAL BILAT MOD SED  01/05/2017  . IR ANGIO VERTEBRAL SEL VERTEBRAL BILAT MOD SED  03/19/2019  . IR ANGIO VERTEBRAL SEL VERTEBRAL UNI L MOD SED  06/04/2020  . IR ANGIOGRAM FOLLOW UP STUDY  04/15/2019  . IR RADIOLOGIST EVAL & MGMT  12/30/2016  . IR  TRANSCATH/EMBOLIZ  04/15/2019  . IR US GUIDE VASC ACCESS RIGHT  03/19/2019  . IR US GUIDE VASC ACCESS RIGHT  06/04/2020  . MALONEY DILATION N/A 09/20/2018   Procedure: Venia Minks DILATION;  Surgeon: Daneil Dolin, MD;  [facility];  Service: Endoscopy;  Laterality: N/A;  . RADIOLOGY WITH ANESTHESIA N/A 04/15/2019   Procedure: [employer];  Surgeon: Luanne Bras, MD;  Location: [place];  Service: Radiology;  Laterality: N/A;  . RIGHT/LEFT HEART CATH AND CORONARY ANGIOGRAPHY N/A 08/19/2019   Procedure: RIGHT/LEFT HEART CATH AND CORONARY ANGIOGRAPHY;  Surgeon: Larey Dresser, MD;  Location: [place] [facility];  Service: Cardiovascular;  Laterality: N/A;  . TUBAL LIGATION  1987  . VAGINAL HYSTERECTOMY  2009     OB History    Gravida  4   Para  4   Term  4   Preterm      AB      Living  4     SAB      IAB      Ectopic      Multiple      Live Births  Family History  Problem Relation Age of Onset  . Diabetes Father   . Hypertension Father   . CAD Father   . Colon polyps Father 60       pre-cancerous   . Stroke Father   . Dementia Father   . Stroke Mother   . Hypertension Mother   . Diabetes Mother   . Heart failure Other   . Breast cancer Maternal Grandmother   . Colon cancer Neg Hx     Social History   Tobacco Use  . Smoking status: Current Some Day Smoker    Packs/day: 0.10    Years: 15.00    Pack years: 1.50    Types: Cigarettes  . Smokeless tobacco: Never Used  . Tobacco comment: smokes a cigarette occasionally  Vaping Use  . Vaping Use: Never used  Substance Use Topics  . Alcohol use: Yes    Comment: occasionally  . Drug use: Not Currently    Types: Marijuana    Comment: former- 2017 last time    Home Medications Prior to Admission medications   Medication Sig Start Date End Date Taking? Authorizing Provider  acetaminophen (TYLENOL) 325 MG tablet Take 325 mg by mouth every 6 (six) hours as needed for mild pain or  headache.    Yes [provider]  ALPRAZolam Duanne Moron) 1 MG tablet Take 1-2 mg by mouth at bedtime.   Yes [provider]  aspirin EC 81 MG tablet Take 81 mg by mouth every evening.    Yes [provider]  atorvastatin (LIPITOR) 80 MG tablet Take 80 mg by mouth every evening.   Yes [provider]  benzonatate (TESSALON) 100 MG capsule Take 2 capsules (200 mg total) by mouth 3 (three) times daily as needed. 06/27/20  Yes Taiwo Fish, Almyra Free, PA-C  carvedilol (COREG) 6.25 MG tablet Take 0.5 tablets (3.125 mg total) by mouth 2 (two) times daily. 03/20/20  Yes Larey Dresser, MD  Cholecalciferol (VITAMIN D3) 10 MCG (400 UNIT) CAPS Take 1 capsule by mouth daily.   Yes [provider]  clopidogrel (PLAVIX) 75 MG tablet Take 1 tablet by mouth once daily 10/08/19  Yes Herminio Commons, MD  dapagliflozin propanediol (FARXIGA) 10 MG TABS tablet Take 10 mg by mouth daily before breakfast. 09/26/19  Yes Larey Dresser, MD  ezetimibe (ZETIA) 10 MG tablet Take 1 tablet (10 mg total) by mouth daily. 01/06/20  Yes Strader, [place] M, PA-C  furosemide (LASIX) 20 MG tablet Take 1 tablet (20 mg total) by mouth every other day. 06/23/20 06/23/21 Yes Larey Dresser, MD  gabapentin (NEURONTIN) 100 MG capsule Take 100 mg by mouth 2 (two) times daily.    Yes [provider]  loperamide (IMODIUM) 2 MG capsule Take 2 mg by mouth 4 (four) times daily as needed for diarrhea or loose stools (ibs).    Yes [provider]  LORazepam (ATIVAN) 1 MG tablet Take 1 mg by mouth daily as needed for anxiety.  11/09/17  Yes [provider]  Multiple Vitamins-Minerals (MULTIVITAMIN WITH MINERALS) tablet Take 1 tablet by mouth daily.   Yes [provider]  nicotine (NICODERM CQ - DOSED IN MG/24 HOURS) 21 mg/24hr patch Place 21 mg onto the skin at bedtime as needed.    Yes [provider]  nitroGLYCERIN (NITROSTAT) 0.4 MG SL tablet Place 1 tablet (0.4 mg total)  under the tongue every 5 (five) minutes x 3 doses as needed for chest pain. 03/05/18  Yes  Manuella Ghazi, Pratik D, DO  omeprazole (PRILOSEC) 20 MG capsule Take 20 mg by mouth daily.    Yes [provider]  predniSONE (DELTASONE) 50 MG tablet Take one tablet daily for 5 days 06/27/20  Yes Haruto Demaria, Almyra Free, PA-C  rOPINIRole (REQUIP) 0.5 MG tablet Take 0.5 mg by mouth at bedtime.  11/13/17  Yes [provider]  sacubitril-valsartan (ENTRESTO) 49-51 MG Take 1 tablet by mouth 2 (two) times daily. 06/23/20  Yes Larey Dresser, MD  spironolactone (ALDACTONE) 25 MG tablet Take 12.5 mg by mouth daily.   Yes [provider]  zinc gluconate 50 MG tablet Take 50 mg by mouth daily.   Yes [provider]    Allergies    Tape  Review of Systems   Review of Systems  Constitutional: Negative for chills and fever.  HENT: Negative for congestion and sore throat.   Eyes: Negative.   Respiratory: Positive for cough, chest tightness and wheezing. Negative for shortness of breath.   Cardiovascular: Negative for chest pain, palpitations and leg swelling.  Gastrointestinal: Positive for nausea. Negative for abdominal pain and vomiting.  Genitourinary: Negative.   Musculoskeletal: Negative for arthralgias, joint swelling and neck pain.  Skin: Negative.  Negative for rash and wound.  Neurological: Negative for dizziness, weakness, light-headedness, numbness and headaches.  Psychiatric/Behavioral: Negative.     Physical Exam Updated Vital Signs BP 135/82   Pulse 99   Temp 98 F (36.7 C) (Oral)   Resp 14   Ht 5' (1.524 m)   Wt 55.3 kg   SpO2 98%   BMI 23.83 kg/m   Physical Exam Vitals and nursing note reviewed.  Constitutional:      Appearance: She is well-developed and well-nourished.  HENT:     Head: Normocephalic and atraumatic.  Eyes:     Conjunctiva/sclera: Conjunctivae normal.  Cardiovascular:     Rate and Rhythm: Normal rate and regular rhythm.     Pulses: Intact  distal pulses.     Heart sounds: Normal heart sounds.  Pulmonary:     Effort: Pulmonary effort is normal.     Breath sounds: Normal breath sounds. No wheezing, rhonchi or rales.     Comments: Frequent dry sounding cough.  Patient has expiratory wheeze bilateral lungs with prolonged expirations. Chest:     Chest wall: No tenderness.  Abdominal:     General: Bowel sounds are normal.     Palpations: Abdomen is soft.     Tenderness: There is no abdominal tenderness.  Musculoskeletal:        General: Normal range of motion.     Cervical back: Normal range of motion.     Right lower leg: No tenderness. No edema.     Left lower leg: No tenderness. No edema.  Skin:    General: Skin is warm and dry.  Neurological:     Mental Status: She is alert.  Psychiatric:        Mood and Affect: Mood and affect normal.     ED Results / Procedures / Treatments   Labs (all labs ordered are listed, but only abnormal results are displayed) Labs Reviewed  BASIC METABOLIC PANEL - Abnormal; Notable for the following components:      Result Value   Glucose, Bld 106 (*)    All other components within normal limits  CBC - Abnormal; Notable for the following components:   RDW 16.2 (*)    All other components within normal limits  SARS CORONAVIRUS 2 (  TAT 6-24 HRS)  BRAIN NATRIURETIC PEPTIDE  POC URINE PREG, ED  TROPONIN I (HIGH SENSITIVITY)  TROPONIN I (HIGH SENSITIVITY)    EKG EKG Interpretation  Date/Time:  Saturday June 27 2020 13:29:49 EST Ventricular Rate:  91 PR Interval:  124 QRS Duration: 80 QT Interval:  358 QTC Calculation: 440 R Axis:   51 Text Interpretation: Normal sinus rhythm Normal ECG No STEMI Confirmed by Octaviano Glow [phone]) on 06/27/2020 6:02:20 PM   Radiology DG Chest 2 View  Result Date: 06/27/2020 CLINICAL DATA:  Onset chest pain, headache and nausea last night. EXAM: CHEST - 2 VIEW COMPARISON:  PA and lateral chest 06/23/2020. FINDINGS: AICD is unchanged.  Lungs are clear. Heart size is normal. Aortic atherosclerosis. No pneumothorax or pleural fluid. IMPRESSION: No acute disease. Aortic Atherosclerosis (ICD10-I70.0). Electronically Signed   By: Inge Rise M.D.   On: 06/27/2020 13:56    Procedures Procedures   Medications Ordered in ED Medications  albuterol (VENTOLIN HFA) 108 (90 Base) MCG/ACT inhaler 2 puff (2 puffs Inhalation Given 06/27/20 1745)  aerochamber Z-Stat Plus/medium 1 each (1 each Other Given 06/27/20 1955)  predniSONE (DELTASONE) tablet 60 mg (60 mg Oral Given 06/27/20 1802)  benzonatate (TESSALON) capsule 200 mg (200 mg Oral Given 06/27/20 2219)    ED Course  I have reviewed the triage vital signs and the nursing notes.  Pertinent labs & imaging results that were available during my care of the patient were reviewed by me and considered in my medical decision making (see chart for details).    MDM Rules/Calculators/A&P                          Labs and imaging reviewed and discussed with patient, she is Covid negative, delta troponins are negative.  Pt underwent heart cath 10 months ago with patent stents observed, this is not ACS.  BNP also normal range, with cxr clear, not CHF.  She was given albuterol mdi here, also started on prednisone.  Her wheezing and chest pressure was improved at time of dc.  She was also given tessalon to help with frequency of cough.  CXR negative for pneumonia/bronchitis changes, no indication for abx at this time.  Return precautions discussed.  Pt was stable at time of dc.  Pt discussed with Dr. Langston Masker prior to dc home. Final Clinical Impression(s) / ED Diagnoses Final diagnoses:  Bronchitis with acute wheezing    Rx / DC Orders ED Discharge Orders         Ordered    predniSONE (DELTASONE) 50 MG tablet        06/27/20 2149    benzonatate (TESSALON) 100 MG capsule  3 times daily PRN        06/27/20 2149           Evalee Jefferson, PA-C 06/29/20 1323    Wyvonnia Dusky,  MD 06/30/20 [phone]

## 2020-06-28 LAB — SARS CORONAVIRUS 2 (TAT 6-24 HRS): SARS Coronavirus 2: NEGATIVE

## 2020-07-02 ENCOUNTER — Other Ambulatory Visit: Payer: Self-pay

## 2020-07-02 ENCOUNTER — Ambulatory Visit (HOSPITAL_COMMUNITY)
Admission: RE | Admit: 2020-07-02 | Discharge: 2020-07-02 | Disposition: A | Discharge status: Home / Self Care | Payer: 59 | Source: Ambulatory Visit | Attending: Cardiology | Admitting: Cardiology

## 2020-07-02 DIAGNOSIS — I5042 Chronic combined systolic (congestive) and diastolic (congestive) heart failure: Secondary | ICD-10-CM | POA: Diagnosis present

## 2020-07-02 LAB — BASIC METABOLIC PANEL
Anion gap: 8 (ref 5–15)
BUN: 16 mg/dL (ref 6–20)
CO2: 23 mmol/L (ref 22–32)
Calcium: 9.5 mg/dL (ref 8.9–10.3)
Chloride: 106 mmol/L (ref 98–111)
Creatinine, Ser: 0.94 mg/dL (ref 0.44–1.00)
GFR, Estimated: >60 mL/min (ref >60)
Glucose, Bld: 102 mg/dL — ABNORMAL HIGH (ref 70–99)
Potassium: 4.3 mmol/L (ref 3.5–5.1)
Sodium: 137 mmol/L (ref 135–145)

## 2020-07-06 ENCOUNTER — Ambulatory Visit (INDEPENDENT_AMBULATORY_CARE_PROVIDER_SITE_OTHER): Payer: 59

## 2020-07-06 DIAGNOSIS — I255 Ischemic cardiomyopathy: Secondary | ICD-10-CM | POA: Diagnosis not present

## 2020-07-06 LAB — CUP PACEART REMOTE DEVICE CHECK
Battery Remaining Longevity: 136 mo
Battery Voltage: 3.14 V
Brady Statistic RV Percent Paced: 0 %
Date Time Interrogation Session: 20220228022825
HighPow Impedance: 85 Ohm
Implantable Lead Implant Date: 20211126
Implantable Lead Location: 753860
Implantable Pulse Generator Implant Date: 20211126
Lead Channel Impedance Value: 570 Ohm
Lead Channel Impedance Value: 703 Ohm
Lead Channel Pacing Threshold Amplitude: 0.625 V
Lead Channel Pacing Threshold Pulse Width: 0.4 ms
Lead Channel Sensing Intrinsic Amplitude: 7 mV
Lead Channel Sensing Intrinsic Amplitude: 7 mV
Lead Channel Setting Pacing Amplitude: 2 V
Lead Channel Setting Pacing Pulse Width: 0.4 ms
Lead Channel Setting Sensing Sensitivity: 0.3 mV

## 2020-07-07 ENCOUNTER — Other Ambulatory Visit: Payer: Self-pay | Admitting: Student

## 2020-07-07 NOTE — Telephone Encounter (Signed)
This is a [place] pt.  °

## 2020-07-09 ENCOUNTER — Other Ambulatory Visit: Payer: Self-pay

## 2020-07-09 ENCOUNTER — Encounter: Payer: Self-pay | Admitting: Cardiology

## 2020-07-09 ENCOUNTER — Ambulatory Visit (INDEPENDENT_AMBULATORY_CARE_PROVIDER_SITE_OTHER): Payer: 59 | Admitting: Cardiology

## 2020-07-09 VITALS — BP 90/54 | HR 102 | Ht 60.0 in | Wt 120.0 lb

## 2020-07-09 DIAGNOSIS — I255 Ischemic cardiomyopathy: Secondary | ICD-10-CM

## 2020-07-09 NOTE — Patient Instructions (Addendum)
Medication Instructions:  Your physician recommends that you continue on your current medications as directed. Please refer to the Current Medication list given to you today.  *If you need a refill on your cardiac medications before your next appointment, please call your pharmacy*   Lab Work: None ordered If you have labs (blood work) drawn today and your tests are completely normal, you will receive your results only by: [name] Kitchen MyChart Message (if you have MyChart) OR . A paper copy in the mail If you have any lab test that is abnormal or we need to change your treatment, we will call you to review the results.   Testing/Procedures: None ordered   Follow-Up: At [facility], you and your health needs are our priority.  As part of our continuing mission to provide you with exceptional heart care, we have created designated Provider Care Teams.  These Care Teams include your primary Cardiologist (physician) and Advanced Practice Providers (APPs -  Physician Assistants and Nurse Practitioners) who all work together to provide you with the care you need, when you need it.   Remote monitoring is used to monitor your Pacemaker or ICD from home. This monitoring reduces the number of office visits required to check your device to one time per year. It allows [place] to keep an eye on the functioning of your device to ensure it is working properly. You are scheduled for a device check from home on 08/05/2019. You may send your transmission at any time that day. If you have a wireless device, the transmission will be sent automatically. After your physician reviews your transmission, you will receive a postcard with your next transmission date.  Your next appointment:   9 month(s)  The format for your next appointment:   In Person  Provider:    Dr. Curt Bears   Thank you for choosing [facility]!!   Trinidad Curet, RN [phone]    Other Instructions

## 2020-07-09 NOTE — Progress Notes (Signed)
Electrophysiology Office Note   Date:  07/09/2020   ID:  Erin [name], Erin [name] March 04, 1961, MRN [id number]  PCP:  [name], MD  Cardiologist:  Aundra Dubin Primary Electrophysiologist:  Herold Salguero Meredith Leeds, MD    Chief Complaint: CHF   History of Present Illness: Erin [name] is a 60 y.o. female who is being seen today for the evaluation of CHF at the request of [name], MD. Presenting today for electrophysiology evaluation.  She has a history significant for coronary artery disease, ischemic cardiomyopathy, and peripheral arterial disease.  She had a non-STEMI November 2017 with PCI to the circumflex and RCA.  She subsequently developed an ischemic cardiomyopathy with an ejection fraction of 30 to 35%.  December 2020 she had embolization of a left posterior communicating artery aneurysm.  She is now status post Medtronic dual-chamber pacemaker implanted 04/03/2020.  Today, denies symptoms of palpitations, chest pain, shortness of breath, orthopnea, PND, lower extremity edema, claudication, dizziness, presyncope, syncope, bleeding, or neurologic sequela. The patient is tolerating medications without difficulties.  Unfortunately she has had quite a few stressors over the last few months.  Both her father and her brother more recently died of heart attacks.  Aside from that, she has been feeling well and is without complaint.   Past Medical History:  Diagnosis Date  . Anxiety state 03/25/2016  . Basal cell carcinoma of forehead   . Brain aneurysm   . CHF (congestive heart failure) ([place])   . Coronary artery disease    a. 03/11/16 PCI with DES-->Prox/Mid Cx;  b. 03/14/16 PCI with DES x2-->RCA, EF 30-35%.  . Essential hypertension   . GERD (gastroesophageal reflux disease)   . HFrEF (heart failure with reduced ejection fraction) ([place])    a. 10/2016 Echo: EF 35-40%, Gr1 DD, mild focal basal septal hypertrophy, basal inflat, mid inflat, basal antlat AK. Mid infept/inf/antlat, apical lateral  sev HK. Mod MR. mildly reduced RV fxn. Mild TR.  [name] Kitchen History of pneumonia   . Hyperlipidemia   . IBS (irritable bowel syndrome)   . Ischemic cardiomyopathy    a. 10/2016 Echo: EF 35-40%, Gr1 DD.  [name] Kitchen Mitral regurgitation   . NSTEMI (non-ST elevated myocardial infarction) ([place]) 03/10/2016  . Pneumonia 03/2016  . Squamous cell cancer of skin of nose   . Thrombocytosis 03/26/2016  . Tobacco abuse   . Trichimoniasis   . Wears dentures   . Wears glasses    Past Surgical History:  Procedure Laterality Date  . APPENDECTOMY    . BIOPSY  09/20/2018   Procedure: BIOPSY;  Surgeon: Daneil Dolin, MD;  [facility];  Service: Endoscopy;;  colon  . CARDIAC CATHETERIZATION N/A 03/11/2016   Procedure: Left Heart Cath and Coronary Angiography;  Surgeon: Leonie Man, MD;  Location: [place] [facility];  Service: Cardiovascular;  Laterality: N/A;  . CARDIAC CATHETERIZATION N/A 03/11/2016   Procedure: Coronary Stent Intervention;  Surgeon: Leonie Man, MD;  Location: [place] [facility];  Service: Cardiovascular;  Laterality: N/A;  . CARDIAC CATHETERIZATION N/A 03/14/2016   Procedure: Coronary Stent Intervention;  Surgeon: Peter M Martinique, MD;  Location: [place] [facility];  Service: Cardiovascular;  Laterality: N/A;  . CHOLECYSTECTOMY OPEN  1984  . COLONOSCOPY WITH PROPOFOL N/A 09/20/2018   Procedure: COLONOSCOPY WITH PROPOFOL;  Surgeon: Daneil Dolin, MD;  [facility];  Service: Endoscopy;  Laterality: N/A;  10:30am  . CORONARY ANGIOPLASTY WITH STENT PLACEMENT  03/14/2016  . ESOPHAGOGASTRODUODENOSCOPY (EGD)  WITH PROPOFOL N/A 09/20/2018   Procedure: ESOPHAGOGASTRODUODENOSCOPY (EGD) WITH PROPOFOL;  Surgeon: Daneil Dolin, MD;  [facility];  Service: Endoscopy;  Laterality: N/A;  . FINGER ARTHROPLASTY Left 05/14/2013   Procedure: LEFT THUMB CARPAL METACARPAL ARTHROPLASTY;  Surgeon: Tennis Must, MD;  Location: [place];  Service: Orthopedics;   Laterality: Left;  . ICD IMPLANT N/A 04/03/2020   Procedure: ICD IMPLANT;  Surgeon: Constance Haw, MD;  Location: [place] [facility];  Service: Cardiovascular;  Laterality: N/A;  . IR ANGIO INTRA EXTRACRAN SEL COM CAROTID INNOMINATE BILAT MOD SED  01/05/2017  . IR ANGIO INTRA EXTRACRAN SEL COM CAROTID INNOMINATE BILAT MOD SED  03/19/2019  . IR ANGIO INTRA EXTRACRAN SEL COM CAROTID INNOMINATE BILAT MOD SED  06/04/2020  . IR ANGIO INTRA EXTRACRAN SEL INTERNAL CAROTID UNI L MOD SED  04/15/2019  . IR ANGIO VERTEBRAL SEL VERTEBRAL BILAT MOD SED  01/05/2017  . IR ANGIO VERTEBRAL SEL VERTEBRAL BILAT MOD SED  03/19/2019  . IR ANGIO VERTEBRAL SEL VERTEBRAL UNI L MOD SED  06/04/2020  . IR ANGIOGRAM FOLLOW UP STUDY  04/15/2019  . IR RADIOLOGIST EVAL & MGMT  12/30/2016  . IR TRANSCATH/EMBOLIZ  04/15/2019  . IR US GUIDE VASC ACCESS RIGHT  03/19/2019  . IR US GUIDE VASC ACCESS RIGHT  06/04/2020  . MALONEY DILATION N/A 09/20/2018   Procedure: Venia Minks DILATION;  Surgeon: Daneil Dolin, MD;  [facility];  Service: Endoscopy;  Laterality: N/A;  . RADIOLOGY WITH ANESTHESIA N/A 04/15/2019   Procedure: [employer];  Surgeon: Luanne Bras, MD;  Location: [place];  Service: Radiology;  Laterality: N/A;  . RIGHT/LEFT HEART CATH AND CORONARY ANGIOGRAPHY N/A 08/19/2019   Procedure: RIGHT/LEFT HEART CATH AND CORONARY ANGIOGRAPHY;  Surgeon: Larey Dresser, MD;  Location: [place] [facility];  Service: Cardiovascular;  Laterality: N/A;  . TUBAL LIGATION  1987  . VAGINAL HYSTERECTOMY  2009     Current Outpatient Medications  Medication Sig Dispense Refill  . acetaminophen (TYLENOL) 325 MG tablet Take 325 mg by mouth every 6 (six) hours as needed for mild pain or headache.     . ALPRAZolam (XANAX) 1 MG tablet Take 1-2 mg by mouth at bedtime.    [name] Kitchen aspirin EC 81 MG tablet Take 81 mg by mouth every evening.     [name] Kitchen atorvastatin (LIPITOR) 80 MG tablet Take 80 mg by mouth every evening.    . benzonatate  (TESSALON) 100 MG capsule Take 2 capsules (200 mg total) by mouth 3 (three) times daily as needed. 30 capsule 0  . carvedilol (COREG) 6.25 MG tablet Take 0.5 tablets (3.125 mg total) by mouth 2 (two) times daily. 30 tablet 6  . Cholecalciferol (VITAMIN D3) 10 MCG (400 UNIT) CAPS Take 1 capsule by mouth daily.    . clopidogrel (PLAVIX) 75 MG tablet Take 1 tablet by mouth once daily 60 tablet 3  . dapagliflozin propanediol (FARXIGA) 10 MG TABS tablet Take 10 mg by mouth daily before breakfast. 30 tablet 11  . ezetimibe (ZETIA) 10 MG tablet Take 1 tablet by mouth once daily 30 tablet 6  . furosemide (LASIX) 20 MG tablet Take 1 tablet (20 mg total) by mouth every other day. 45 tablet 3  . gabapentin (NEURONTIN) 100 MG capsule Take 100 mg by mouth 2 (two) times daily.     [name] Kitchen loperamide (IMODIUM) 2 MG capsule Take 2 mg by mouth 4 (four) times daily as needed for diarrhea  or loose stools (ibs).     . LORazepam (ATIVAN) 1 MG tablet Take 1 mg by mouth daily as needed for anxiety.   1  . Multiple Vitamins-Minerals (MULTIVITAMIN WITH MINERALS) tablet Take 1 tablet by mouth daily.    . nicotine (NICODERM CQ - DOSED IN MG/24 HOURS) 21 mg/24hr patch Place 21 mg onto the skin at bedtime as needed.     . nitroGLYCERIN (NITROSTAT) 0.4 MG SL tablet Place 1 tablet (0.4 mg total) under the tongue every 5 (five) minutes x 3 doses as needed for chest pain. 30 tablet 12  . omeprazole (PRILOSEC) 20 MG capsule Take 20 mg by mouth daily.     . predniSONE (DELTASONE) 50 MG tablet Take one tablet daily for 5 days 5 tablet 0  . rOPINIRole (REQUIP) 0.5 MG tablet Take 0.5 mg by mouth at bedtime.   0  . sacubitril-valsartan (ENTRESTO) 49-51 MG Take 1 tablet by mouth 2 (two) times daily. 180 tablet 3  . spironolactone (ALDACTONE) 25 MG tablet Take 12.5 mg by mouth daily.    [name] Kitchen zinc gluconate 50 MG tablet Take 50 mg by mouth daily.     No current facility-administered medications for this visit.    Allergies:   Tape   Social  History:  The patient  reports that she has been smoking cigarettes. She has a 1.50 pack-year smoking history. She has never used smokeless tobacco. She reports current alcohol use. She reports previous drug use. Drug: Marijuana.   Family History:  The patient's family history includes Breast cancer in her maternal grandmother; CAD in her father; Colon polyps (age of onset: 108) in her father; Dementia in her father; Diabetes in her father and mother; Heart failure in an other family member; Hypertension in her father and mother; Stroke in her father and mother.    ROS:  Please see the history of present illness.   Otherwise, review of systems is positive for none.   All other systems are reviewed and negative.   PHYSICAL EXAM: VS:  BP (!) 90/54   Pulse (!) 102   Ht 5' (1.524 m)   Wt 120 lb (54.4 kg)   SpO2 99%   BMI 23.44 kg/m  , BMI Body mass index is 23.44 kg/m. GEN: Well nourished, well developed, in no acute distress  HEENT: normal  Neck: no JVD, carotid bruits, or masses Cardiac: RRR; no murmurs, rubs, or gallops,no edema  Respiratory:  clear to auscultation bilaterally, normal work of breathing GI: soft, nontender, nondistended, + BS MS: no deformity or atrophy  Skin: warm and dry, device site well healed Neuro:  Strength and sensation are intact Psych: euthymic mood, full affect  EKG:  EKG is ordered today. Personal review of the ekg ordered shows sinus rhythm, rate 88  Personal review of the device interrogation today. Results in [place]: 12/10/2019: TSH 0.674 02/11/2020: Magnesium 2.5 03/17/2020: ALT 20 06/27/2020: B Natriuretic Peptide 72.0; Hemoglobin 13.1; Platelets 396 07/02/2020: BUN 16; Creatinine, Ser 0.94; Potassium 4.3; Sodium 137    Lipid Panel     Component Value Date/Time   CHOL 118 03/20/2020 1118   TRIG 138 03/20/2020 1118   HDL 49 03/20/2020 1118   CHOLHDL 2.4 03/20/2020 1118   VLDL 28 03/20/2020 1118   LDLCALC 41 03/20/2020 1118      Wt Readings from Last 3 Encounters:  07/09/20 120 lb (54.4 kg)  06/27/20 122 lb (55.3 kg)  06/23/20 125 lb (56.7 kg)  Other studies Reviewed: Additional studies/ records that were reviewed today include: TTE 03/20/20  Review of the above records today demonstrates:  1. Left ventricular ejection fraction, by estimation, is 30 to 35%. The  left ventricle has moderately decreased function. The left ventricle  demonstrates regional wall motion abnormalities (see scoring  diagram/findings for description). The left  ventricular internal cavity size was moderately to severely dilated. There  is mild asymmetric left ventricular hypertrophy. Left ventricular  diastolic parameters are consistent with Grade II diastolic dysfunction  (pseudonormalization). There is severe  dyskinesis of the left ventricular, basal-mid inferior wall.  2. Right ventricular systolic function is normal. The right ventricular  size is normal. There is normal pulmonary artery systolic pressure.  3. The mitral valve is grossly normal. No evidence of mitral valve  regurgitation.  4. The aortic valve is normal in structure. Aortic valve regurgitation is  not visualized.   Right and left heart catheterization 08/19/2019 1. Normal filling pressures.  2. Preserved cardiac output.  3. No obstructive CAD, patent stents.    ASSESSMENT AND PLAN:  1.  Chronic systolic heart failure due to ischemic cardiomyopathy: Currently on optimal medical therapy with Coreg, Aldactone, [name].  Her ejection fraction remains reduced.  She is now status post Medtronic ICD implanted 04/03/2020.  Device functioning appropriately.  No changes at this time.  2.  Coronary artery disease: Status post non-STEMI in 2017 with stents to the circumflex and RCA.  No current chest pain.  Plan per primary cardiology.    3.  Tobacco abuse: Complete cessation encouraged   Current medicines are reviewed at length with the  patient today.   The patient does not have concerns regarding her medicines.  The following changes were made today:  none  Labs/ tests ordered today include:  No orders of the defined types were placed in this encounter.    Disposition:   FU with [place] Sample 9 months  Signed, Maleaha Hughett Meredith Leeds, MD  07/09/2020 3:34 PM     [address] [place] [id number] [phone] (office) [phone] (fax)

## 2020-07-13 ENCOUNTER — Encounter (HOSPITAL_COMMUNITY): Payer: Self-pay | Admitting: *Deleted

## 2020-07-13 ENCOUNTER — Emergency Department (HOSPITAL_COMMUNITY)
Admission: EM | Admit: 2020-07-13 | Discharge: 2020-07-13 | Disposition: A | Discharge status: Home / Self Care | Payer: 59 | Attending: Emergency Medicine | Admitting: Emergency Medicine

## 2020-07-13 ENCOUNTER — Other Ambulatory Visit: Payer: Self-pay

## 2020-07-13 DIAGNOSIS — Z955 Presence of coronary angioplasty implant and graft: Secondary | ICD-10-CM | POA: Diagnosis not present

## 2020-07-13 DIAGNOSIS — I5042 Chronic combined systolic (congestive) and diastolic (congestive) heart failure: Secondary | ICD-10-CM | POA: Insufficient documentation

## 2020-07-13 DIAGNOSIS — Z7982 Long term (current) use of aspirin: Secondary | ICD-10-CM | POA: Diagnosis not present

## 2020-07-13 DIAGNOSIS — Z85828 Personal history of other malignant neoplasm of skin: Secondary | ICD-10-CM | POA: Insufficient documentation

## 2020-07-13 DIAGNOSIS — F17210 Nicotine dependence, cigarettes, uncomplicated: Secondary | ICD-10-CM | POA: Insufficient documentation

## 2020-07-13 DIAGNOSIS — R519 Headache, unspecified: Secondary | ICD-10-CM | POA: Diagnosis present

## 2020-07-13 DIAGNOSIS — J0100 Acute maxillary sinusitis, unspecified: Secondary | ICD-10-CM | POA: Insufficient documentation

## 2020-07-13 DIAGNOSIS — I110 Hypertensive heart disease with heart failure: Secondary | ICD-10-CM | POA: Insufficient documentation

## 2020-07-13 DIAGNOSIS — I2510 Atherosclerotic heart disease of native coronary artery without angina pectoris: Secondary | ICD-10-CM | POA: Diagnosis not present

## 2020-07-13 DIAGNOSIS — R110 Nausea: Secondary | ICD-10-CM | POA: Diagnosis not present

## 2020-07-13 DIAGNOSIS — Z79899 Other long term (current) drug therapy: Secondary | ICD-10-CM | POA: Insufficient documentation

## 2020-07-13 MED ORDER — DICYCLOMINE HCL 20 MG PO TABS: 20.0000 mg | ORAL_TABLET | Freq: Two times a day (BID) | ORAL | 0 refills | Status: DC | PRN

## 2020-07-13 MED ORDER — AMOXICILLIN-POT CLAVULANATE 875-125 MG PO TABS
1.0000 | ORAL_TABLET | Freq: Once | ORAL | Status: AC
  Administered 2020-07-13: 1 via ORAL
  Filled 2020-07-13: qty 1

## 2020-07-13 MED ORDER — ONDANSETRON 4 MG PO TBDP: 4.0000 mg | ORAL_TABLET | Freq: Three times a day (TID) | ORAL | 0 refills | Status: DC | PRN

## 2020-07-13 MED ORDER — AMOXICILLIN-POT CLAVULANATE 875-125 MG PO TABS: 1.0000 | ORAL_TABLET | Freq: Two times a day (BID) | ORAL | 0 refills | Status: AC

## 2020-07-13 NOTE — ED Provider Notes (Signed)
[facility] Provider Note   CSN: [id number] Arrival date & time: 07/13/20  6759     History CC:  Sinus pressure  Erin [name] is a 60 y.o. female w/ hx of CAD, CHF, presenting to the ED with sinus pressure x 1 month.  She was seen initially in the emergency department on June 27 2020 with cough, chest pain, and wheezing, with headache and nausea.  Her x-ray did not show signs of pneumonia and her labs are reassuring.  She was diagnosed with suspected viral syndrome.  Her Covid was negative at the time.  She subsequently ports she has had persistent positional headache and pressure in the front of her face since then.  She said is worse with bending over.  She has never had this kind of symptoms before.  She describes sensation of fullness in her bilateral ears as well as "pain like it is in my upper teeth, although I wear dentures."  She denies fevers.  No vision changes.  She states her doctor started her on doxycycline recently and she has 2 days left.  She began to develop some nausea 2 days ago but has a sensitive stomach.  She was also retested for Covid a few days ago and the second test was also negative.  She is fully vaccinated for Covid.  She has taken sinus mucinex at home.   HPI     Past Medical History:  Diagnosis Date  . Anxiety state 03/25/2016  . Basal cell carcinoma of forehead   . Brain aneurysm   . CHF (congestive heart failure) ([place])   . Coronary artery disease    a. 03/11/16 PCI with DES-->Prox/Mid Cx;  b. 03/14/16 PCI with DES x2-->RCA, EF 30-35%.  . Essential hypertension   . GERD (gastroesophageal reflux disease)   . HFrEF (heart failure with reduced ejection fraction) ([place])    a. 10/2016 Echo: EF 35-40%, Gr1 DD, mild focal basal septal hypertrophy, basal inflat, mid inflat, basal antlat AK. Mid infept/inf/antlat, apical lateral sev HK. Mod MR. mildly reduced RV fxn. Mild TR.  [name] Kitchen History of pneumonia   . Hyperlipidemia   . IBS  (irritable bowel syndrome)   . Ischemic cardiomyopathy    a. 10/2016 Echo: EF 35-40%, Gr1 DD.  [name] Kitchen Mitral regurgitation   . NSTEMI (non-ST elevated myocardial infarction) ([place]) 03/10/2016  . Pneumonia 03/2016  . Squamous cell cancer of skin of nose   . Thrombocytosis 03/26/2016  . Tobacco abuse   . Trichimoniasis   . Wears dentures   . Wears glasses     Patient Active Problem List   Diagnosis Date Noted  . Cough 02/11/2020  . Near syncope 12/10/2019  . Brain aneurysm 04/15/2019  . Dysphagia 02/27/2018  . Encounter for screening colonoscopy 02/27/2018  . History of Clostridium difficile infection 02/27/2018  . Frequent stools 12/20/2017  . Chronic combined systolic and diastolic CHF (congestive heart failure) ([place]) 06/20/2016  . Hypokalemia   . Acute CHF (congestive heart failure) ([place]) 05/16/2016  . Acute on chronic systolic CHF (congestive heart failure) ([place]) 05/16/2016  . Acute respiratory failure with hypoxia ([place])   . Thrombocytosis 03/26/2016  . Cardiomyopathy, ischemic 03/25/2016  . Chronic combined systolic and diastolic heart failure ([place]) 03/25/2016  . Anxiety state 03/25/2016  . Troponin level elevated 03/25/2016  . CAD (coronary artery disease), native coronary artery 03/25/2016  . Normocytic anemia 03/25/2016  . SOB (shortness of breath) 03/24/2016  . Lightheadedness 03/17/2016  . Hypotension 03/17/2016  .  Tobacco abuse 03/12/2016  . NSTEMI (non-ST elevated myocardial infarction) ([place]) 03/11/2016  . Atypical chest pain   . Essential hypertension 09/06/2015  . Mixed hyperlipidemia 09/06/2015  . GERD (gastroesophageal reflux disease) 09/06/2015  . Chest pain 09/06/2015    Past Surgical History:  Procedure Laterality Date  . APPENDECTOMY    . BIOPSY  09/20/2018   Procedure: BIOPSY;  Surgeon: Daneil Dolin, MD;  [facility];  Service: Endoscopy;;  colon  . CARDIAC CATHETERIZATION N/A 03/11/2016   Procedure: Left Heart Cath and Coronary  Angiography;  Surgeon: Leonie Man, MD;  Location: [facility];  Service: Cardiovascular;  Laterality: N/A;  . CARDIAC CATHETERIZATION N/A 03/11/2016   Procedure: Coronary Stent Intervention;  Surgeon: Leonie Man, MD;  Location: [facility];  Service: Cardiovascular;  Laterality: N/A;  . CARDIAC CATHETERIZATION N/A 03/14/2016   Procedure: Coronary Stent Intervention;  Surgeon: Peter M Martinique, MD;  Location: [facility];  Service: Cardiovascular;  Laterality: N/A;  . CHOLECYSTECTOMY OPEN  1984  . COLONOSCOPY WITH PROPOFOL N/A 09/20/2018   Procedure: COLONOSCOPY WITH PROPOFOL;  Surgeon: Daneil Dolin, MD;  [facility];  Service: Endoscopy;  Laterality: N/A;  10:30am  . CORONARY ANGIOPLASTY WITH STENT PLACEMENT  03/14/2016  . ESOPHAGOGASTRODUODENOSCOPY (EGD) WITH PROPOFOL N/A 09/20/2018   Procedure: ESOPHAGOGASTRODUODENOSCOPY (EGD) WITH PROPOFOL;  Surgeon: Daneil Dolin, MD;  [facility];  Service: Endoscopy;  Laterality: N/A;  . FINGER ARTHROPLASTY Left 05/14/2013   Procedure: LEFT THUMB CARPAL METACARPAL ARTHROPLASTY;  Surgeon: Tennis Must, MD;  Location: [facility];  Service: Orthopedics;  Laterality: Left;  . ICD IMPLANT N/A 04/03/2020   Procedure: ICD IMPLANT;  Surgeon: Constance Haw, MD;  Location: [facility];  Service: Cardiovascular;  Laterality: N/A;  . IR ANGIO INTRA EXTRACRAN SEL COM CAROTID INNOMINATE BILAT MOD SED  01/05/2017  . IR ANGIO INTRA EXTRACRAN SEL COM CAROTID INNOMINATE BILAT MOD SED  03/19/2019  . IR ANGIO INTRA EXTRACRAN SEL COM CAROTID INNOMINATE BILAT MOD SED  06/04/2020  . IR ANGIO INTRA EXTRACRAN SEL INTERNAL CAROTID UNI L MOD SED  04/15/2019  . IR ANGIO VERTEBRAL SEL VERTEBRAL BILAT MOD SED  01/05/2017  . IR ANGIO VERTEBRAL SEL VERTEBRAL BILAT MOD SED  03/19/2019  . IR ANGIO VERTEBRAL SEL VERTEBRAL UNI L MOD SED  06/04/2020  . IR ANGIOGRAM FOLLOW UP STUDY  04/15/2019  . IR RADIOLOGIST EVAL  & MGMT  12/30/2016  . IR TRANSCATH/EMBOLIZ  04/15/2019  . IR US GUIDE VASC ACCESS RIGHT  03/19/2019  . IR US GUIDE VASC ACCESS RIGHT  06/04/2020  . MALONEY DILATION N/A 09/20/2018   Procedure: Venia Minks DILATION;  Surgeon: Daneil Dolin, MD;  [facility];  Service: Endoscopy;  Laterality: N/A;  . RADIOLOGY WITH ANESTHESIA N/A 04/15/2019   Procedure: [employer];  Surgeon: Luanne Bras, MD;  Location: [facility];  Service: Radiology;  Laterality: N/A;  . RIGHT/LEFT HEART CATH AND CORONARY ANGIOGRAPHY N/A 08/19/2019   Procedure: RIGHT/LEFT HEART CATH AND CORONARY ANGIOGRAPHY;  Surgeon: Larey Dresser, MD;  Location: [facility];  Service: Cardiovascular;  Laterality: N/A;  . TUBAL LIGATION  1987  . VAGINAL HYSTERECTOMY  2009     OB History    Gravida  4   Para  4   Term  4   Preterm      AB      Living  4  SAB      IAB      Ectopic      Multiple      Live Births              Family History  Problem Relation Age of Onset  . Diabetes Father   . Hypertension Father   . CAD Father   . Colon polyps Father 60       pre-cancerous   . Stroke Father   . Dementia Father   . Stroke Mother   . Hypertension Mother   . Diabetes Mother   . Heart failure Other   . Breast cancer Maternal Grandmother   . Colon cancer Neg Hx     Social History   Tobacco Use  . Smoking status: Current Some Day Smoker    Packs/day: 0.10    Years: 15.00    Pack years: 1.50    Types: Cigarettes  . Smokeless tobacco: Never Used  . Tobacco comment: smokes a cigarette occasionally  Vaping Use  . Vaping Use: Never used  Substance Use Topics  . Alcohol use: Yes    Comment: occasionally  . Drug use: Not Currently    Types: Marijuana    Comment: former- 2017 last time    Home Medications Prior to Admission medications   Medication Sig Start Date End Date Taking? Authorizing Provider  amoxicillin-clavulanate (AUGMENTIN) 875-125 MG tablet Take 1 tablet by mouth  every 12 (twelve) hours for 7 days. 07/13/20 07/20/20 Yes Dura Mccormack, Carola Rhine, MD  dicyclomine (BENTYL) 20 MG tablet Take 1 tablet (20 mg total) by mouth 2 (two) times daily as needed for up to 10 days for spasms. For abdominal cramping, diarrhea 07/13/20 07/23/20 Yes Mccall Lomax, Carola Rhine, MD  ondansetron (ZOFRAN ODT) 4 MG disintegrating tablet Take 1 tablet (4 mg total) by mouth every 8 (eight) hours as needed for up to 12 doses for nausea or vomiting. 07/13/20  Yes Sera Hitsman, Carola Rhine, MD  acetaminophen (TYLENOL) 325 MG tablet Take 325 mg by mouth every 6 (six) hours as needed for mild pain or headache.     [provider]  ALPRAZolam Duanne Moron) 1 MG tablet Take 1-2 mg by mouth at bedtime.    [provider]  aspirin EC 81 MG tablet Take 81 mg by mouth every evening.     [provider]  atorvastatin (LIPITOR) 80 MG tablet Take 80 mg by mouth every evening.    [provider]  benzonatate (TESSALON) 100 MG capsule Take 2 capsules (200 mg total) by mouth 3 (three) times daily as needed. 06/27/20   Evalee Jefferson, PA-C  carvedilol (COREG) 6.25 MG tablet Take 0.5 tablets (3.125 mg total) by mouth 2 (two) times daily. 03/20/20   Larey Dresser, MD  Cholecalciferol (VITAMIN D3) 10 MCG (400 UNIT) CAPS Take 1 capsule by mouth daily.    [provider]  clopidogrel (PLAVIX) 75 MG tablet Take 1 tablet by mouth once daily 10/08/19   Herminio Commons, MD  dapagliflozin propanediol (FARXIGA) 10 MG TABS tablet Take 10 mg by mouth daily before breakfast. 09/26/19   Larey Dresser, MD  ezetimibe (ZETIA) 10 MG tablet Take 1 tablet by mouth once daily 07/07/20   Larey Dresser, MD  furosemide (LASIX) 20 MG tablet Take 1 tablet (20 mg total) by mouth every other day. 06/23/20 06/23/21  Larey Dresser, MD  gabapentin (NEURONTIN) 100 MG capsule Take 100 mg by mouth 2 (two) times daily.  [provider]  loperamide (IMODIUM) 2 MG capsule Take 2 mg by mouth 4 (four) times daily as  needed for diarrhea or loose stools (ibs).     [provider]  LORazepam (ATIVAN) 1 MG tablet Take 1 mg by mouth daily as needed for anxiety.  11/09/17   [provider]  Multiple Vitamins-Minerals (MULTIVITAMIN WITH MINERALS) tablet Take 1 tablet by mouth daily.    [provider]  nicotine (NICODERM CQ - DOSED IN MG/24 HOURS) 21 mg/24hr patch Place 21 mg onto the skin at bedtime as needed.     [provider]  nitroGLYCERIN (NITROSTAT) 0.4 MG SL tablet Place 1 tablet (0.4 mg total) under the tongue every 5 (five) minutes x 3 doses as needed for chest pain. 03/05/18   Manuella Ghazi, Pratik D, DO  omeprazole (PRILOSEC) 20 MG capsule Take 20 mg by mouth daily.     [provider]  predniSONE (DELTASONE) 50 MG tablet Take one tablet daily for 5 days 06/27/20   Idol, Almyra Free, PA-C  rOPINIRole (REQUIP) 0.5 MG tablet Take 0.5 mg by mouth at bedtime.  11/13/17   [provider]  sacubitril-valsartan (ENTRESTO) 49-51 MG Take 1 tablet by mouth 2 (two) times daily. 06/23/20   Larey Dresser, MD  spironolactone (ALDACTONE) 25 MG tablet Take 12.5 mg by mouth daily.    [provider]  zinc gluconate 50 MG tablet Take 50 mg by mouth daily.    [provider]    Allergies    Tape  Review of Systems   Review of Systems  Constitutional: Negative for chills and fever.  HENT: Positive for congestion, ear pain, sinus pressure and sinus pain. Negative for trouble swallowing.   Eyes: Negative for photophobia, pain and visual disturbance.  Respiratory: Negative for cough and shortness of breath.   Cardiovascular: Negative for chest pain and palpitations.  Gastrointestinal: Negative for abdominal pain and vomiting.  Musculoskeletal: Negative for arthralgias and back pain.  Skin: Negative for color change and rash.  Neurological: Positive for headaches. Negative for seizures and syncope.  All other systems reviewed and are negative.   Physical  Exam Updated Vital Signs BP 125/75 (BP Location: Left Arm)   Pulse 87   Temp 97.8 F (36.6 C) (Oral)   Resp 18   Ht 5' (1.524 m)   Wt 54.4 kg   SpO2 100%   BMI 23.44 kg/m   Physical Exam Constitutional:      General: She is not in acute distress. HENT:     Head: Normocephalic and atraumatic.     Comments: Frontal and bilateral maxillary sinus ttp    Right Ear: Tympanic membrane and ear canal normal.     Left Ear: Tympanic membrane and ear canal normal.     Mouth/Throat:     Pharynx: No oropharyngeal exudate or posterior oropharyngeal erythema.  Eyes:     General: No scleral icterus.       Right eye: No discharge.        Left eye: No discharge.     Extraocular Movements: Extraocular movements intact.     Conjunctiva/sclera: Conjunctivae normal.     Pupils: Pupils are equal, round, and reactive to light.     Comments: No proptosis  Cardiovascular:     Rate and Rhythm: Normal rate and regular rhythm.  Pulmonary:     Effort: Pulmonary effort is normal. No respiratory distress.  Abdominal:     General: There is no distension.  Tenderness: There is no abdominal tenderness.  Skin:    General: Skin is warm and dry.  Neurological:     General: No focal deficit present.     Mental Status: She is alert. Mental status is at baseline.  Psychiatric:        Mood and Affect: Mood normal.        Behavior: Behavior normal.     ED Results / Procedures / Treatments   Labs (all labs ordered are listed, but only abnormal results are displayed) Labs Reviewed - No data to display  EKG None  Radiology No results found.  Procedures Procedures   Medications Ordered in ED Medications  amoxicillin-clavulanate (AUGMENTIN) 875-125 MG per tablet 1 tablet (has no administration in time range)    ED Course  I have reviewed the triage vital signs and the nursing notes.  Pertinent labs & imaging results that were available during my care of the patient were reviewed by me and  considered in my medical decision making (see chart for details).  60 yo female here with suspected sinus inflammation and sinus headache, ongoing for about 3-4 weeks now, in the setting of initial suspected viral URI.  Two negative covid tests during that time period, and fully vaccinated - doubt this is covid  She has significant tenderness of both sinuses on exam.  With symptoms for 4 weeks, reasonable to treat for bacterial sinusitis.  Doxycycline may not have been great coverage; we'll switch to augmentin.  No evidence of CVT or invasive infection of the brain.  She is afebrile, well appearing, and ocular exam is normal.  Doubt meningitis, sepsis, or oropharyngeal infection at this time. Doubt SAH, brain tumor, or aneurysm.  MRI brain in Aug 2021 was structurally normal - no invasive tumor or masses noted at that time.  I plan to switch from doxycycline to augmentin for 7 days.  Will offer ENT follow up if not improving, as she will have failed two courses of antibiotics at that time.  There may be an alternative explanation for sinus inflammation.     She was given prednisone during her first ED visit 3 weeks ago and states it did not help any of her symptoms, and she would prefer not to have steroids again.  I will also offer bentyl and zofran for home as she may have GI upset related to augmentin.      Final Clinical Impression(s) / ED Diagnoses Final diagnoses:  Subacute maxillary sinusitis    Rx / DC Orders ED Discharge Orders         Ordered    amoxicillin-clavulanate (AUGMENTIN) 875-125 MG tablet  Every 12 hours        07/13/20 0757    ondansetron (ZOFRAN ODT) 4 MG disintegrating tablet  Every 8 hours PRN        07/13/20 0757    dicyclomine (BENTYL) 20 MG tablet  2 times daily PRN        07/13/20 0757           Wyvonnia Dusky, MD 07/13/20 [phone]

## 2020-07-13 NOTE — Discharge Instructions (Addendum)
I suspect you may have a sinusitis.  Your sinuses are tender and may be inflamed.  This can be caused by several issues, but a common one is an infection.  Because your symptoms have been ongoing for 4 weeks now, it is reasonable to treat you for a possible bacterial infection.  I would have you stop taking your current antibiotic, which is doxycycline.  You should start taking the new antibiotic, which is Augmentin.   I also prescribed Zofran and Bentyl, 2 medications that can help with stomach cramping, nausea, and diarrhea.  These are common side effects of taking Augmentin.  Please try to complete the full course of Augmentin if possible, which is 7 days.    I also gave you a number to follow-up with an ENT doctor if you are not improving after your antibiotics.

## 2020-07-13 NOTE — ED Triage Notes (Signed)
Pt c/o bilateral ear pain, fever, cough, nasal drainage, headache, facial pressure when she bends over x 1 month. Pt reports she was tested for Covid on Thursday which was negative and told she had a sinus infection. Pt was given antibiotic which she has been taking but it is making her have n/v.

## 2020-07-14 ENCOUNTER — Encounter (HOSPITAL_COMMUNITY): Payer: 59

## 2020-07-14 NOTE — Progress Notes (Signed)
Remote ICD transmission.   

## 2020-07-20 ENCOUNTER — Other Ambulatory Visit: Payer: Self-pay | Admitting: Student

## 2020-07-21 ENCOUNTER — Other Ambulatory Visit: Payer: Self-pay | Admitting: Student

## 2020-07-21 NOTE — Telephone Encounter (Signed)
This is a CHF pt 

## 2020-07-23 ENCOUNTER — Other Ambulatory Visit: Payer: Self-pay | Admitting: *Deleted

## 2020-07-23 MED ORDER — CLOPIDOGREL BISULFATE 75 MG PO TABS: 75.0000 mg | ORAL_TABLET | Freq: Every day | ORAL | 0 refills | Status: DC

## 2020-07-29 NOTE — Progress Notes (Signed)
PCP: [name], MD HF Cardiology: [name]  60 y.o. with history of CAD, ischemic cardiomyopathy, and PAD was referred by [name] for evaluation of CHF.  She had NSTEMI in 11/17 with PCI to prox/mid LCx and RCA.  Subsequently, has developed ischemic cardiomyopathy.  Most recent echo in 1/21 showed EF 30-35%.  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   RHC/LHC done with exertional chest heaviness and dyspnea in 4/21 showed normal filling pressures, preserved cardiac output, and nonobstructive mild CAD.  ABIs were normal in 4/21.   In 8/21, she had syncope thought to be related to orthostasis from low BP in the setting of cardiac medication titration.  Entresto was decreased.   She was in the ER in 11/21 with chest pain.  Troponin and COVID-19 negative. Echo in 11/21 showed EF 30-35%, normal RV.   Today she returns for HF follow up.Overall feeling fair. Complaining of sinus issues. She has completed 2 rounds of antibiotics/prednisone but still having issues.  SOB with steps. Walks up 17 steps to do laundry. Denies PND/Orthopnea. No chest pain Appetite poor.  No fever or chills. Weight at home has gone down 10 pounds over the last week. zSmoking ~ 1 cigarette per day.   Taking all medications. 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 She has 8 children at home.   Labs (2/21): K 3.7, creatinine 0.82 Labs (4/21): K 4.3, creatinine 0.81, LDL 67, TGs 286 Labs (6/21): K 4.7, creatinine 1.22 Labs (11/21): K 3.7, creatinine 0.84, hgb 12.6, LDL 67, HDL 48, TGs 286 Labs (1/22). K 4.4, creatinine 1.03 Labs (07/02/20): K 4.3 Creatinine 0.94   Medtronic device interrogation: Fluid index up and impedance down. Activity 2.89 hours per day.  PMH: 1. CAD: NSTEMI with DES to proximal and mid LCx in 11/17, staged DES x 2 to RCA later in 11/17.  - Cardiolite (10/20): EF < 30%, large inferior and inferolateral MI with mild peri-infarct ischemia.  - LHC (4/21):  Patent stents, nonobstructive CAD.  2. Chronic systolic CHF: Ischemic cardiomyopathy. Medtronic ICD.  - Echo (10/20): EF 35-40%, lateral WMAs, normal RV. - Echo (1/21): EF 30-35%, mild LVH, normal RV - RHC (4/21): mean RA 5, PA 29/3, mean PCWP 12, CI 3.04 - Echo (11/21): EF 30-35%, normal RV.  3. 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  4. Carotid stenosis: Carotid dopplers (38/25) with 05-39% LICA stenosis.  - Carotid dopplers (6/21): 40-59% BICA stenosis.  - Carotid dopplers (1/22): 76-73% RICA, 41-93% LICA.  5. Active smoker.  6. PAD: ABIs (4/21) Normal.  - Peripheral artery dopplers (12/21): bilateral plaque without focal stenosis.   Social History   Socioeconomic History  . Marital status: Married    Spouse name: Not on file  . Number of children: Not on file  . Years of education: Not on file  . Highest education level: Not on file  Occupational History  . Occupation: CNA  Tobacco Use  . Smoking status: Current Some Day Smoker    Packs/day: 0.10    Years: 15.00    Pack years: 1.50    Types: Cigarettes  . Smokeless tobacco: Never Used  . Tobacco comment: smokes a cigarette occasionally  Vaping Use  . Vaping Use: Never used  Substance and Sexual Activity  . Alcohol use: Yes    Comment: occasionally  . Drug use: Not Currently    Types: Marijuana    Comment: former- 2017 last time  .  Sexual activity: Not Currently    Birth control/protection: Surgical    Comment: hyst  Other Topics Concern  . Not on file  Social History Narrative   Lives with husband in [place] in a one story home with a basement.  Has 4 children.  Works as a Quarry manager.  Education: CNA school.    Social Determinants of Health   Financial Resource Strain: Not on file  Food Insecurity: Not on file  Transportation Needs: Not on file  Physical Activity: Not on file  Stress: Not on file  Social Connections: Not on file  Intimate Partner Violence: Not on file   Family  History  Problem Relation Age of Onset  . Diabetes Father   . Hypertension Father   . CAD Father   . Colon polyps Father 60       pre-cancerous   . Stroke Father   . Dementia Father   . Stroke Mother   . Hypertension Mother   . Diabetes Mother   . Heart failure Other   . Breast cancer Maternal Grandmother   . Colon cancer Neg Hx    ROS: All systems reviewed and negative except as per HPI.   Current Outpatient Medications  Medication Sig Dispense Refill  . acetaminophen (TYLENOL) 325 MG tablet Take 325 mg by mouth every 6 (six) hours as needed for mild pain or headache.     . ALPRAZolam (XANAX) 1 MG tablet Take 1-2 mg by mouth at bedtime.    [name] Kitchen aspirin EC 81 MG tablet Take 81 mg by mouth every evening.     [name] Kitchen atorvastatin (LIPITOR) 80 MG tablet TAKE 1 TABLET BY MOUTH ONCE DAILY IN THE EVENING AT  6PM 90 tablet 0  . carvedilol (COREG) 6.25 MG tablet Take 0.5 tablets (3.125 mg total) by mouth 2 (two) times daily. 30 tablet 6  . Cholecalciferol (VITAMIN D3) 10 MCG (400 UNIT) CAPS Take 1 capsule by mouth daily.    . clopidogrel (PLAVIX) 75 MG tablet Take 1 tablet (75 mg total) by mouth daily. 30 tablet 0  . dapagliflozin propanediol (FARXIGA) 10 MG TABS tablet Take 10 mg by mouth daily before breakfast. 30 tablet 11  . ezetimibe (ZETIA) 10 MG tablet Take 1 tablet by mouth once daily 30 tablet 6  . furosemide (LASIX) 20 MG tablet Take 1 tablet (20 mg total) by mouth every other day. 45 tablet 3  . gabapentin (NEURONTIN) 100 MG capsule Take 100 mg by mouth 2 (two) times daily.     [name] Kitchen loperamide (IMODIUM) 2 MG capsule Take 2 mg by mouth 4 (four) times daily as needed for diarrhea or loose stools (ibs).     . LORazepam (ATIVAN) 1 MG tablet Take 1 mg by mouth daily as needed for anxiety.   1  . Multiple Vitamins-Minerals (MULTIVITAMIN WITH MINERALS) tablet Take 1 tablet by mouth daily.    . nicotine (NICODERM CQ - DOSED IN MG/24 HOURS) 21 mg/24hr patch Place 21 mg onto the skin at bedtime as  needed.     . nitroGLYCERIN (NITROSTAT) 0.4 MG SL tablet Place 1 tablet (0.4 mg total) under the tongue every 5 (five) minutes x 3 doses as needed for chest pain. 30 tablet 12  . omeprazole (PRILOSEC) 20 MG capsule Take 20 mg by mouth daily.     [name] Kitchen rOPINIRole (REQUIP) 0.5 MG tablet Take 0.5 mg by mouth at bedtime.   0  . sacubitril-valsartan (ENTRESTO) 49-51 MG Take 1 tablet by mouth 2 (two)  times daily. 180 tablet 3  . spironolactone (ALDACTONE) 25 MG tablet Take 12.5 mg by mouth daily.    [name] Kitchen zinc gluconate 50 MG tablet Take 50 mg by mouth daily.     No current facility-administered medications for this encounter.   BP 120/80   Pulse 96   Wt 54.1 kg (119 lb 3.2 oz)   SpO2 99%   BMI 23.28 kg/m   Wt Readings from Last 3 Encounters:  07/30/20 54.1 kg (119 lb 3.2 oz)  07/13/20 54.4 kg (120 lb)  07/09/20 54.4 kg (120 lb)    General:  Walked in the clinic. No resp difficulty HEENT: normal Neck: supple. JVP 7-8 . Carotids 2+ bilat; no bruits. No lymphadenopathy or thryomegaly appreciated. Cor: PMI nondisplaced. Regular rate & rhythm. No rubs, gallops or murmurs. Lungs: clear Abdomen: soft, nontender, nondistended. No hepatosplenomegaly. No bruits or masses. Good bowel sounds. Extremities: no cyanosis, clubbing, rash, edema Neuro: alert & orientedx3, cranial nerves grossly intact. moves all 4 extremities w/o difficulty. Affect pleasant  Assessment/Plan: 1. CAD: S/p NSTEMI in 11/17 with DEX to LCx and DES x 2 to RCA.  Cardiolite in 10/20 with EF <30%, inferior/inferolateral infarct with peri-infarct ischemia. LHC in 4/21 showed nonobstructive mild CAD.  She has Medtronic ICD.  -No chest pain.   - Continue atorvastatin and Zetia. Good LDL 11/21.  - Continue ASA 81 and Plavix 75 mg daily for now.  She will likely be a good candidate to replace Plavix with rivaroxaban 2.5 mg bid in the future. She will continue Plavix as long as Dr. Estanislado Pandy feels it is needed post her cranial circulation  intervention.  - no bleeding issues.  2. Chronic systolic CHF: Ischemic cardiomyopathy. Echo in 11/21 showed stable EF 30-35%.  RHC in 4/21 with normal filling pressures and preserved cardiac output. Has Medtronic ICD. Contacted the devic clinic to enroll in monitoring program.  -Optivol - Fluid index elevated. Impedance down.  NYHA III. Asked her to take 20 mg po lasix today then continue lasix every other day.  - Increase entresto to 97-103 mg twice a day . BMEt next week.  - Continue Coreg at 3.125 mg bid.     - Continue spironolactone 12.5 mg daily.  - Continue dapagliflozin 10 mg daily.   3. Carotid stenosis: Repeat carotid dopplers in 1/23.  4. Smoking: Continues to cut back on cigarette use. Continued to encourage cessation.   5. 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   Discussed device interrogation and medication changes.   Follow up with APP 6-8 weeks and 3-4 months with Dr Aundra Dubin.    Bowden Boody NP-C 07/30/2020

## 2020-07-30 ENCOUNTER — Ambulatory Visit (HOSPITAL_COMMUNITY)
Admission: RE | Admit: 2020-07-30 | Discharge: 2020-07-30 | Disposition: A | Discharge status: Home / Self Care | Payer: 59 | Source: Ambulatory Visit | Attending: Adult Health | Admitting: Adult Health

## 2020-07-30 ENCOUNTER — Other Ambulatory Visit: Payer: Self-pay

## 2020-07-30 ENCOUNTER — Encounter (HOSPITAL_COMMUNITY): Payer: Self-pay

## 2020-07-30 VITALS — BP 120/80 | HR 96 | Wt 119.2 lb

## 2020-07-30 DIAGNOSIS — Z7982 Long term (current) use of aspirin: Secondary | ICD-10-CM | POA: Diagnosis not present

## 2020-07-30 DIAGNOSIS — I255 Ischemic cardiomyopathy: Secondary | ICD-10-CM | POA: Diagnosis not present

## 2020-07-30 DIAGNOSIS — Z95810 Presence of automatic (implantable) cardiac defibrillator: Secondary | ICD-10-CM | POA: Diagnosis not present

## 2020-07-30 DIAGNOSIS — I5022 Chronic systolic (congestive) heart failure: Secondary | ICD-10-CM | POA: Diagnosis not present

## 2020-07-30 DIAGNOSIS — Z79899 Other long term (current) drug therapy: Secondary | ICD-10-CM | POA: Insufficient documentation

## 2020-07-30 DIAGNOSIS — Z7902 Long term (current) use of antithrombotics/antiplatelets: Secondary | ICD-10-CM | POA: Diagnosis not present

## 2020-07-30 DIAGNOSIS — I5042 Chronic combined systolic (congestive) and diastolic (congestive) heart failure: Secondary | ICD-10-CM

## 2020-07-30 DIAGNOSIS — I6529 Occlusion and stenosis of unspecified carotid artery: Secondary | ICD-10-CM | POA: Diagnosis not present

## 2020-07-30 DIAGNOSIS — I252 Old myocardial infarction: Secondary | ICD-10-CM | POA: Diagnosis not present

## 2020-07-30 DIAGNOSIS — R0602 Shortness of breath: Secondary | ICD-10-CM | POA: Diagnosis not present

## 2020-07-30 DIAGNOSIS — Z8249 Family history of ischemic heart disease and other diseases of the circulatory system: Secondary | ICD-10-CM | POA: Insufficient documentation

## 2020-07-30 DIAGNOSIS — F17210 Nicotine dependence, cigarettes, uncomplicated: Secondary | ICD-10-CM | POA: Insufficient documentation

## 2020-07-30 DIAGNOSIS — I2510 Atherosclerotic heart disease of native coronary artery without angina pectoris: Secondary | ICD-10-CM | POA: Diagnosis not present

## 2020-07-30 DIAGNOSIS — Z955 Presence of coronary angioplasty implant and graft: Secondary | ICD-10-CM | POA: Insufficient documentation

## 2020-07-30 MED ORDER — ENTRESTO 97-103 MG PO TABS: 1.0000 | ORAL_TABLET | Freq: Two times a day (BID) | ORAL | 11 refills | Status: DC

## 2020-07-30 NOTE — Patient Instructions (Signed)
No Labs done today.   TAKE AN EXTRA 20MG  (1 TABLET) OF LASIX TODAY.  INCREASE Entresto to 97-103mg  (1 tablet) by mouth 2 times daily.   No other medication changes were made. Please continue all current medications as prescribed.  Your physician recommends that you schedule a follow-up appointment in: 1 week for a lab only appointment, 6-8 weeks with APP Clinic here in office and in 3-4 months with [name].   If you have any questions or concerns before your next appointment please send [place] a message through [place] or call our office at [phone].    TO LEAVE A MESSAGE FOR THE NURSE SELECT OPTION 2, PLEASE LEAVE A MESSAGE INCLUDING: . YOUR NAME . DATE OF BIRTH . CALL BACK NUMBER . REASON FOR CALL**this is important as we prioritize the call backs  YOU WILL RECEIVE A CALL BACK THE SAME DAY AS LONG AS YOU CALL BEFORE 4:00 PM   Do the following things EVERYDAY: 1) Weigh yourself in the morning before breakfast. Write it down and keep it in a log. 2) Take your medicines as prescribed 3) Eat low salt foods--Limit salt (sodium) to 2000 mg per day.  4) Stay as active as you can everyday 5) Limit all fluids for the day to less than 2 liters   At the [facility], you and your health needs are our priority. As part of our continuing mission to provide you with exceptional heart care, we have created designated Provider Care Teams. These Care Teams include your primary Cardiologist (physician) and Advanced Practice Providers (APPs- Physician Assistants and Nurse Practitioners) who all work together to provide you with the care you need, when you need it.   You may see any of the following providers on your designated Care Team at your next follow up: [name] Kitchen Dr Glori Bickers . Dr Loralie Champagne . Darrick Grinder, NP . Lyda Jester, PA . [name], PharmD   Please be sure to bring in all your medications bottles to every appointment.

## 2020-08-04 ENCOUNTER — Telehealth: Payer: Self-pay

## 2020-08-04 NOTE — Telephone Encounter (Signed)
Patient referred to [facility] clinic by Darrick Grinder, NP HF clinic.  Spoke with patient and ICM intro given.  Explained Amy would like to have an updated report to review fluid levels since she started Lasix on 07/30/2020.  She reports she is not home but will send remote transmission within the next hour.  Will review transmission when received and call her back.

## 2020-08-05 NOTE — Telephone Encounter (Signed)
Spoke with patient and reviewed transmission.  Confirmed she is taking Lasix 20 mg every other day.    Explained report suggests the fluid levels have returned to normal on the day of transmission, 08/04/2020.  She reports her shortness of breath and weight gain have resolved.  She weighs daily and thinks baseline weight is 119 lbs.  She reports the highest weight before Lasix was 127 lbs.  Education given to limit salt/fluid intake and continue Lasix 20 mg every other day.  Advised if fluid symptoms return while taking lasix to call back and provided ICM direct number.    She agreed to monthly ICM follow up and scheduled next ICM Remote transmission for 08/24/2020.  Copy sent to Darrick Grinder, NP HF clinic as requested for follow after starting Lasix.   Advised if Erin [name] has any further recommendations will call her back.   08/04/2020 Optivol thoracic impedance suggesting fluid levels returned to normal.

## 2020-08-05 NOTE — Telephone Encounter (Signed)
  Thank you so much for connecting her with the [facility].   Looks much better.    No change for now.   Lindsey Demonte NP-C  11:44 AM

## 2020-08-06 ENCOUNTER — Ambulatory Visit (HOSPITAL_COMMUNITY)
Admission: RE | Admit: 2020-08-06 | Discharge: 2020-08-06 | Disposition: A | Discharge status: Home / Self Care | Payer: 59 | Source: Ambulatory Visit | Attending: Cardiology | Admitting: Cardiology

## 2020-08-06 ENCOUNTER — Other Ambulatory Visit: Payer: Self-pay

## 2020-08-06 DIAGNOSIS — I5042 Chronic combined systolic (congestive) and diastolic (congestive) heart failure: Secondary | ICD-10-CM | POA: Diagnosis not present

## 2020-08-06 LAB — BASIC METABOLIC PANEL
Anion gap: 6 (ref 5–15)
BUN: 15 mg/dL (ref 6–20)
CO2: 27 mmol/L (ref 22–32)
Calcium: 9.5 mg/dL (ref 8.9–10.3)
Chloride: 104 mmol/L (ref 98–111)
Creatinine, Ser: 0.89 mg/dL (ref 0.44–1.00)
GFR, Estimated: >60 mL/min (ref >60)
Glucose, Bld: 112 mg/dL — ABNORMAL HIGH (ref 70–99)
Potassium: 3.9 mmol/L (ref 3.5–5.1)
Sodium: 137 mmol/L (ref 135–145)

## 2020-08-17 ENCOUNTER — Other Ambulatory Visit: Payer: Self-pay | Admitting: Family Medicine

## 2020-08-17 ENCOUNTER — Telehealth (HOSPITAL_COMMUNITY): Payer: Self-pay | Admitting: Pharmacy Technician

## 2020-08-17 ENCOUNTER — Other Ambulatory Visit (HOSPITAL_COMMUNITY): Payer: Self-pay | Admitting: *Deleted

## 2020-08-17 ENCOUNTER — Other Ambulatory Visit (HOSPITAL_COMMUNITY): Payer: Self-pay

## 2020-08-17 MED ORDER — ENTRESTO 97-103 MG PO TABS
1.0000 | ORAL_TABLET | Freq: Two times a day (BID) | ORAL | 3 refills | Status: DC
  Filled 2020-11-11 (×2): qty 180, 90d supply, fill #0

## 2020-08-17 NOTE — Telephone Encounter (Signed)
It's time to re-enroll patient to receive medication assistance for Entresto from [employer]. Upon further review, patient has Pharmacist, community. 90 day supply of Entresto is, $500. Was able to activate a $10 co-pay card for the patient.  BIN: [id number] PCN: [id number] ID: [id number] Group: [id number]  [place], ([place]) a request for a 90 day RX to be sent to the patient's pharmacy with the attached co-pay card information. The co-pay card information should also be sent to the patient's email and cell phone number on file.   Called and left a message with a family member for the patient to return my call regarding this change.   Charlann Boxer, CPhT

## 2020-08-17 NOTE — Telephone Encounter (Signed)
This is a [place] pt °

## 2020-08-24 ENCOUNTER — Ambulatory Visit (INDEPENDENT_AMBULATORY_CARE_PROVIDER_SITE_OTHER): Payer: 59

## 2020-08-24 DIAGNOSIS — I5042 Chronic combined systolic (congestive) and diastolic (congestive) heart failure: Secondary | ICD-10-CM

## 2020-08-24 DIAGNOSIS — Z95810 Presence of automatic (implantable) cardiac defibrillator: Secondary | ICD-10-CM

## 2020-08-25 NOTE — Progress Notes (Signed)
EPIC Encounter for ICM Monitoring  Patient Name: Erin [name] is a 60 y.o. female Date: 08/25/2020 Primary Care Physican: [name], MD Primary Cardiologist: Aundra Dubin Electrophysiologist: Curt Bears 07/30/2020 Weight: 119 lbs        1st ICM Remote Transmission.  Attempted call to patient and unable to reach.  Person answering phone stated she was working.  Transmission reviewed.    Optivol thoracic impedance normal.  Prescribed:   Furosemide 20 mg take 1 tablet every other day.    Spironolactone 25 mg take 0.5 tablet daily.  Recommendations: Unable to reach.    Follow-up plan: ICM clinic phone appointment on 09/29/2020.   91 day device clinic remote transmission 10/06/2020.    EP/Cardiology Office Visits: 09/10/2020 with Advanced HF PA/NP.  10/21/2020 with Dr Aundra Dubin  Copy of ICM check sent to Dr. Curt Bears.   3 month ICM trend: 08/24/2020.    1 Year ICM trend:       Rosalene Billings, RN 08/25/2020 4:36 PM

## 2020-08-26 ENCOUNTER — Ambulatory Visit: Payer: 59 | Admitting: Student

## 2020-08-26 NOTE — Progress Notes (Signed)
Spoke with patient.  She had hand swelling and chest congestion during decreased impedance due to eating a restaurant steak dinner.  Symptoms resolved by taking extra Furosemide.  She currently has allergies that is making her cough and feel bad but otherwise without complaint.  Best time to reach her is before she starts work at [employer].

## 2020-09-01 ENCOUNTER — Other Ambulatory Visit: Payer: Self-pay | Admitting: *Deleted

## 2020-09-01 MED ORDER — NITROGLYCERIN 0.4 MG SL SUBL
0.4000 mg | SUBLINGUAL_TABLET | SUBLINGUAL | 2 refills | Status: DC | PRN
  Filled 2020-11-11 (×2): qty 25, 5d supply, fill #0

## 2020-09-05 ENCOUNTER — Encounter (HOSPITAL_COMMUNITY): Payer: Self-pay

## 2020-09-05 ENCOUNTER — Other Ambulatory Visit: Payer: Self-pay

## 2020-09-05 ENCOUNTER — Emergency Department (HOSPITAL_COMMUNITY)
Admission: EM | Admit: 2020-09-05 | Discharge: 2020-09-06 | Disposition: A | Discharge status: Home / Self Care | Payer: 59 | Attending: Emergency Medicine | Admitting: Emergency Medicine

## 2020-09-05 ENCOUNTER — Emergency Department (HOSPITAL_COMMUNITY): Payer: 59

## 2020-09-05 DIAGNOSIS — I959 Hypotension, unspecified: Secondary | ICD-10-CM | POA: Insufficient documentation

## 2020-09-05 DIAGNOSIS — I110 Hypertensive heart disease with heart failure: Secondary | ICD-10-CM | POA: Diagnosis not present

## 2020-09-05 DIAGNOSIS — R072 Precordial pain: Secondary | ICD-10-CM | POA: Diagnosis not present

## 2020-09-05 DIAGNOSIS — Z951 Presence of aortocoronary bypass graft: Secondary | ICD-10-CM | POA: Diagnosis not present

## 2020-09-05 DIAGNOSIS — R55 Syncope and collapse: Secondary | ICD-10-CM | POA: Insufficient documentation

## 2020-09-05 DIAGNOSIS — F17210 Nicotine dependence, cigarettes, uncomplicated: Secondary | ICD-10-CM | POA: Insufficient documentation

## 2020-09-05 DIAGNOSIS — K219 Gastro-esophageal reflux disease without esophagitis: Secondary | ICD-10-CM | POA: Insufficient documentation

## 2020-09-05 DIAGNOSIS — I2510 Atherosclerotic heart disease of native coronary artery without angina pectoris: Secondary | ICD-10-CM | POA: Diagnosis not present

## 2020-09-05 DIAGNOSIS — R630 Anorexia: Secondary | ICD-10-CM | POA: Diagnosis not present

## 2020-09-05 DIAGNOSIS — Z96692 Finger-joint replacement of left hand: Secondary | ICD-10-CM | POA: Insufficient documentation

## 2020-09-05 DIAGNOSIS — Z85828 Personal history of other malignant neoplasm of skin: Secondary | ICD-10-CM | POA: Insufficient documentation

## 2020-09-05 DIAGNOSIS — G8929 Other chronic pain: Secondary | ICD-10-CM | POA: Diagnosis not present

## 2020-09-05 DIAGNOSIS — R1013 Epigastric pain: Secondary | ICD-10-CM | POA: Diagnosis not present

## 2020-09-05 DIAGNOSIS — R42 Dizziness and giddiness: Secondary | ICD-10-CM | POA: Diagnosis present

## 2020-09-05 DIAGNOSIS — I5042 Chronic combined systolic (congestive) and diastolic (congestive) heart failure: Secondary | ICD-10-CM | POA: Diagnosis not present

## 2020-09-05 LAB — CBC WITH DIFFERENTIAL/PLATELET
Abs Immature Granulocytes: 0.06 10*3/uL (ref 0.00–0.07)
Basophils Absolute: 0 10*3/uL (ref 0.0–0.1)
Basophils Relative: 0 %
Eosinophils Absolute: 0.1 10*3/uL (ref 0.0–0.5)
Eosinophils Relative: 1 %
HCT: 37.8 % (ref 36.0–46.0)
Hemoglobin: 12.2 g/dL (ref 12.0–15.0)
Immature Granulocytes: 1 %
Lymphocytes Relative: 27 %
Lymphs Abs: 2.6 10*3/uL (ref 0.7–4.0)
MCH: 29 pg (ref 26.0–34.0)
MCHC: 32.3 g/dL (ref 30.0–36.0)
MCV: 90 fL (ref 80.0–100.0)
Monocytes Absolute: 0.7 10*3/uL (ref 0.1–1.0)
Monocytes Relative: 7 %
Neutro Abs: 6.2 10*3/uL (ref 1.7–7.7)
Neutrophils Relative %: 64 %
Platelets: 354 10*3/uL (ref 150–400)
RBC: 4.2 MIL/uL (ref 3.87–5.11)
RDW: 15.5 % (ref 11.5–15.5)
WBC: 9.7 10*3/uL (ref 4.0–10.5)
nRBC: 0 % (ref 0.0–0.2)

## 2020-09-05 LAB — COMPREHENSIVE METABOLIC PANEL
ALT: 26 U/L (ref 0–44)
AST: 26 U/L (ref 15–41)
Albumin: 4.1 g/dL (ref 3.5–5.0)
Alkaline Phosphatase: 66 U/L (ref 38–126)
Anion gap: 8 (ref 5–15)
BUN: 23 mg/dL — ABNORMAL HIGH (ref 6–20)
CO2: 26 mmol/L (ref 22–32)
Calcium: 9.5 mg/dL (ref 8.9–10.3)
Chloride: 101 mmol/L (ref 98–111)
Creatinine, Ser: 1.37 mg/dL — ABNORMAL HIGH (ref 0.44–1.00)
GFR, Estimated: 45 mL/min — ABNORMAL LOW (ref >60)
Glucose, Bld: 91 mg/dL (ref 70–99)
Potassium: 3.8 mmol/L (ref 3.5–5.1)
Sodium: 135 mmol/L (ref 135–145)
Total Bilirubin: 0.8 mg/dL (ref 0.3–1.2)
Total Protein: 7.3 g/dL (ref 6.5–8.1)

## 2020-09-05 LAB — BRAIN NATRIURETIC PEPTIDE: B Natriuretic Peptide: 62 pg/mL (ref 0.0–100.0)

## 2020-09-05 LAB — TROPONIN I (HIGH SENSITIVITY): Troponin I (High Sensitivity): 7 ng/L (ref <18)

## 2020-09-05 MED ORDER — SODIUM CHLORIDE 0.9 % IV BOLUS
250.0000 mL | Freq: Once | INTRAVENOUS | Status: AC
  Administered 2020-09-05: 250 mL via INTRAVENOUS

## 2020-09-05 NOTE — ED Provider Notes (Signed)
  Provider Note MRN:  [id number]  Arrival date & time: 09/06/20    ED Course and Medical Decision Making  Assumed care from Dr. Laverta [place] at shift change.  Chest pressure and lightheadedness, awaiting second troponin.  Soft blood pressures.  Some increased creatinine, recently started some Lasix, possibly over diuresed, providing [name] bolus and will reassess.  Plan is to admit if pressures continue to remain soft.  Second troponin is normal.  After [name] bolus patient now has normal blood pressure.  She has no complaints.  She has follow-up with cardiology in just a few days.  Discussed options with patient i.e. discharge versus admission, she is comfortable with discharge and follow-up.  Strict return precautions.  Procedures  Final Clinical Impressions(s) / ED Diagnoses     ICD-10-CM   1. Hypotension, unspecified hypotension type  I95.9   2. Precordial chest pain  R07.2     ED Discharge Orders    None        Discharge Instructions     You were evaluated in the Emergency Department and after careful evaluation, we did not find any emergent condition requiring admission or further testing in the hospital.  Your exam/testing today was overall reassuring.  Skip your Sunday dose of Lasix, take your dose of Lasix on Wednesday, and keep your appointment with cardiology on Thursday.  Please return to the Emergency Department if you experience any worsening of your condition.  Thank you for allowing [place] to be a part of your care.      Barth Kirks. Erin [name], [place] [email or web address]    Maudie Flakes, MD 09/06/20 [phone]

## 2020-09-05 NOTE — ED Provider Notes (Signed)
Emergency Department Provider Note   I have reviewed the triage vital signs and the nursing notes.   HISTORY  Chief Complaint Dizziness   HPI Erin [name] is a 60 y.o. female with past medical history reviewed below including ischemic cardiomyopathy with EF approximately 35% followed by Dr. Algernon Huxley presents to the emergency department with heaviness in the chest and near syncope which began at work.  Patient works as a Chartered certified accountant at the [employer] and at around 5 PM developed an epigastric/lower chest pressure radiating to the back.  No sharp/ripping/tearing feeling.  She has chronic pain in the abdomen with IBS which is unchanged.  She has poor appetite at baseline.  She felt lightheaded like she may pass out and sat down and had a staff member check her BP and found it to be low in the 56O systolic. She tried to rest and drink some fluids but symptoms persisted and she walked over the to the ED. Notes that her daily weights have actually been lower than normal. Only med changes are going back to Lasix 40 mg every other day and increasing dose of Entresto but that has been several weeks ago. Denies fever, chills, vomiting.   Past Medical History:  Diagnosis Date  . Anxiety state 03/25/2016  . Basal cell carcinoma of forehead   . Brain aneurysm   . CHF (congestive heart failure) ([place])   . Coronary artery disease    a. 03/11/16 PCI with DES-->Prox/Mid Cx;  b. 03/14/16 PCI with DES x2-->RCA, EF 30-35%.  . Essential hypertension   . GERD (gastroesophageal reflux disease)   . HFrEF (heart failure with reduced ejection fraction) ([place])    a. 10/2016 Echo: EF 35-40%, Gr1 DD, mild focal basal septal hypertrophy, basal inflat, mid inflat, basal antlat AK. Mid infept/inf/antlat, apical lateral sev HK. Mod MR. mildly reduced RV fxn. Mild TR.  [name] Kitchen History of pneumonia   . Hyperlipidemia   . IBS (irritable bowel syndrome)   . Ischemic cardiomyopathy    a. 10/2016 Echo: EF 35-40%, Gr1 DD.  [name] Kitchen  Mitral regurgitation   . NSTEMI (non-ST elevated myocardial infarction) ([place]) 03/10/2016  . Pneumonia 03/2016  . Squamous cell cancer of skin of nose   . Thrombocytosis 03/26/2016  . Tobacco abuse   . Trichimoniasis   . Wears dentures   . Wears glasses     Patient Active Problem List   Diagnosis Date Noted  . Cough 02/11/2020  . Near syncope 12/10/2019  . Brain aneurysm 04/15/2019  . Dysphagia 02/27/2018  . Encounter for screening colonoscopy 02/27/2018  . History of Clostridium difficile infection 02/27/2018  . Frequent stools 12/20/2017  . Chronic combined systolic and diastolic CHF (congestive heart failure) ([place]) 06/20/2016  . Hypokalemia   . Acute CHF (congestive heart failure) ([place]) 05/16/2016  . Acute on chronic systolic CHF (congestive heart failure) ([place]) 05/16/2016  . Acute respiratory failure with hypoxia ([place])   . Thrombocytosis 03/26/2016  . Cardiomyopathy, ischemic 03/25/2016  . Chronic combined systolic and diastolic heart failure ([place]) 03/25/2016  . Anxiety state 03/25/2016  . Troponin level elevated 03/25/2016  . CAD (coronary artery disease), native coronary artery 03/25/2016  . Normocytic anemia 03/25/2016  . SOB (shortness of breath) 03/24/2016  . Lightheadedness 03/17/2016  . Hypotension 03/17/2016  . Tobacco abuse 03/12/2016  . NSTEMI (non-ST elevated myocardial infarction) ([place]) 03/11/2016  . Atypical chest pain   . Essential hypertension 09/06/2015  . Mixed hyperlipidemia 09/06/2015  . GERD (gastroesophageal reflux disease)  09/06/2015  . Chest pain 09/06/2015    Past Surgical History:  Procedure Laterality Date  . APPENDECTOMY    . BIOPSY  09/20/2018   Procedure: BIOPSY;  Surgeon: Daneil Dolin, MD;  [facility];  Service: Endoscopy;;  colon  . CARDIAC CATHETERIZATION N/A 03/11/2016   Procedure: Left Heart Cath and Coronary Angiography;  Surgeon: Leonie Man, MD;  Location: [facility];  Service: Cardiovascular;   Laterality: N/A;  . CARDIAC CATHETERIZATION N/A 03/11/2016   Procedure: Coronary Stent Intervention;  Surgeon: Leonie Man, MD;  Location: [facility];  Service: Cardiovascular;  Laterality: N/A;  . CARDIAC CATHETERIZATION N/A 03/14/2016   Procedure: Coronary Stent Intervention;  Surgeon: Peter M Martinique, MD;  Location: [facility];  Service: Cardiovascular;  Laterality: N/A;  . CHOLECYSTECTOMY OPEN  1984  . COLONOSCOPY WITH PROPOFOL N/A 09/20/2018   Procedure: COLONOSCOPY WITH PROPOFOL;  Surgeon: Daneil Dolin, MD;  [facility];  Service: Endoscopy;  Laterality: N/A;  10:30am  . CORONARY ANGIOPLASTY WITH STENT PLACEMENT  03/14/2016  . ESOPHAGOGASTRODUODENOSCOPY (EGD) WITH PROPOFOL N/A 09/20/2018   Procedure: ESOPHAGOGASTRODUODENOSCOPY (EGD) WITH PROPOFOL;  Surgeon: Daneil Dolin, MD;  [facility];  Service: Endoscopy;  Laterality: N/A;  . FINGER ARTHROPLASTY Left 05/14/2013   Procedure: LEFT THUMB CARPAL METACARPAL ARTHROPLASTY;  Surgeon: Tennis Must, MD;  Location: [facility];  Service: Orthopedics;  Laterality: Left;  . ICD IMPLANT N/A 04/03/2020   Procedure: ICD IMPLANT;  Surgeon: Constance Haw, MD;  Location: [facility];  Service: Cardiovascular;  Laterality: N/A;  . IR ANGIO INTRA EXTRACRAN SEL COM CAROTID INNOMINATE BILAT MOD SED  01/05/2017  . IR ANGIO INTRA EXTRACRAN SEL COM CAROTID INNOMINATE BILAT MOD SED  03/19/2019  . IR ANGIO INTRA EXTRACRAN SEL COM CAROTID INNOMINATE BILAT MOD SED  06/04/2020  . IR ANGIO INTRA EXTRACRAN SEL INTERNAL CAROTID UNI L MOD SED  04/15/2019  . IR ANGIO VERTEBRAL SEL VERTEBRAL BILAT MOD SED  01/05/2017  . IR ANGIO VERTEBRAL SEL VERTEBRAL BILAT MOD SED  03/19/2019  . IR ANGIO VERTEBRAL SEL VERTEBRAL UNI L MOD SED  06/04/2020  . IR ANGIOGRAM FOLLOW UP STUDY  04/15/2019  . IR RADIOLOGIST EVAL & MGMT  12/30/2016  . IR TRANSCATH/EMBOLIZ  04/15/2019  . IR US GUIDE VASC ACCESS RIGHT  03/19/2019   . IR US GUIDE VASC ACCESS RIGHT  06/04/2020  . MALONEY DILATION N/A 09/20/2018   Procedure: Venia Minks DILATION;  Surgeon: Daneil Dolin, MD;  [facility];  Service: Endoscopy;  Laterality: N/A;  . RADIOLOGY WITH ANESTHESIA N/A 04/15/2019   Procedure: [employer];  Surgeon: Luanne Bras, MD;  Location: [facility];  Service: Radiology;  Laterality: N/A;  . RIGHT/LEFT HEART CATH AND CORONARY ANGIOGRAPHY N/A 08/19/2019   Procedure: RIGHT/LEFT HEART CATH AND CORONARY ANGIOGRAPHY;  Surgeon: Larey Dresser, MD;  Location: [facility];  Service: Cardiovascular;  Laterality: N/A;  . TUBAL LIGATION  1987  . VAGINAL HYSTERECTOMY  2009    Allergies Tape  Family History  Problem Relation Age of Onset  . Diabetes Father   . Hypertension Father   . CAD Father   . Colon polyps Father 60       pre-cancerous   . Stroke Father   . Dementia Father   . Stroke Mother   . Hypertension Mother   . Diabetes Mother   . Heart failure Other   .  Breast cancer Maternal Grandmother   . Colon cancer Neg Hx     Social History Social History   Tobacco Use  . Smoking status: Current Some Day Smoker    Packs/day: 0.10    Years: 15.00    Pack years: 1.50    Types: Cigarettes  . Smokeless tobacco: Never Used  . Tobacco comment: smokes a cigarette occasionally  Vaping Use  . Vaping Use: Never used  Substance Use Topics  . Alcohol use: Yes    Comment: occasionally  . Drug use: Not Currently    Types: Marijuana    Comment: former- 2017 last time    Review of Systems  Constitutional: No fever/chills Eyes: No visual changes. ENT: No sore throat. Cardiovascular: Positive chest pain and near syncope.  Respiratory: Denies shortness of breath. Gastrointestinal: Positive epigastric abdominal pain.  No nausea, no vomiting.  No diarrhea.  No constipation. Genitourinary: Negative for dysuria. Musculoskeletal: Negative for back pain. Skin: Negative for rash. Neurological: Negative for  headaches, focal weakness or numbness.  10-point ROS otherwise negative.  ____________________________________________   PHYSICAL EXAM:  VITAL SIGNS: ED Triage Vitals  Enc Vitals Group     BP 09/05/20 2149 96/64     Pulse Rate 09/05/20 2149 83     Resp 09/05/20 2149 18     Temp 09/05/20 2149 97.7 F (36.5 C)     Temp Source 09/05/20 2149 Oral     SpO2 09/05/20 2149 100 %     Weight 09/05/20 2150 115 lb (52.2 kg)     Height 09/05/20 2150 5' (1.524 m)   Constitutional: Alert and oriented. Well appearing and in no acute distress. Eyes: Conjunctivae are normal. Head: Atraumatic. Nose: No congestion/rhinnorhea. Mouth/Throat: Mucous membranes are moist.  Neck: No stridor.  Cardiovascular: Normal rate, regular rhythm. Good peripheral circulation. Grossly normal heart sounds.   Respiratory: Normal respiratory effort.  No retractions. Lungs CTAB. Gastrointestinal: Soft and nontender. No distention.  Musculoskeletal: No lower extremity tenderness nor edema. No gross deformities of extremities. Neurologic:  Normal speech and language.  Skin:  Skin is warm, dry and intact. No rash noted.   ____________________________________________   LABS (all labs ordered are listed, but only abnormal results are displayed)  Labs Reviewed  COMPREHENSIVE METABOLIC PANEL - Abnormal; Notable for the following components:      Result Value   BUN 23 (*)    Creatinine, Ser 1.37 (*)    GFR, Estimated 45 (*)    All other components within normal limits  BRAIN NATRIURETIC PEPTIDE  CBC WITH DIFFERENTIAL/PLATELET  TROPONIN I (HIGH SENSITIVITY)   ____________________________________________  EKG   EKG Interpretation  Date/Time:  Saturday September 05 2020 22:00:55 EDT Ventricular Rate:  81 PR Interval:  134 QRS Duration: 74 QT Interval:  367 QTC Calculation: 426 R Axis:   17 Text Interpretation: Sinus rhythm Low voltage, extremity and precordial leads Confirmed by Nanda Quinton [id number]) on  09/05/2020 10:12:11 PM       ____________________________________________  RADIOLOGY  DG Chest Portable 1 View  Result Date: 09/05/2020 CLINICAL DATA:  Mid chest pain with mild shortness of breath. EXAM: PORTABLE CHEST 1 VIEW COMPARISON:  June 27, 2020 FINDINGS: There is a single lead ventricular pacer which is stable in position. The lungs are hyperinflated. There is no evidence of acute infiltrate, pleural effusion or pneumothorax. The heart size and mediastinal contours are within normal limits. The visualized skeletal structures are unremarkable. IMPRESSION: No acute cardiopulmonary disease. Electronically Signed   By: Hoover Browns  [place] M.D.   On: 09/05/2020 22:56    ____________________________________________   PROCEDURES  Procedure(s) performed:   Procedures  CRITICAL CARE Performed by: Margette Fast Total critical care time: 35 minutes Critical care time was exclusive of separately billable procedures and treating other patients. Critical care was necessary to treat or prevent imminent or life-threatening deterioration. Critical care was time spent personally by me on the following activities: development of treatment plan with patient and/or surrogate as well as nursing, discussions with consultants, evaluation of patient's response to treatment, examination of patient, obtaining history from patient or surrogate, ordering and performing treatments and interventions, ordering and review of laboratory studies, ordering and review of radiographic studies, pulse oximetry and re-evaluation of patient's condition.  Nanda Quinton, MD Emergency Medicine  ____________________________________________   INITIAL IMPRESSION / ASSESSMENT AND PLAN / ED COURSE  Pertinent labs & imaging results that were available during my care of the patient were reviewed by me and considered in my medical decision making (see chart for details).   Patient presents to the emergency department with  discomfort in the upper abdomen/lower chest which is pressure-like and radiating to the back along with presyncope.  Apparently was hypotensive at the [facility] when she took her vital signs.  Here, arrival BP is 96/64 with no tachycardia.  She does not appear acutely volume overloaded and is actually down on her weights over the past several weeks.  Question if she is over diuresed but will hold on IV fluids for now with history of CHF.  Chest pain is somewhat atypical and low in the chest.  Her EKG does not show evidence of acute ischemic change.  I will follow troponins, BNP, Medtronic pacer interrogation.  11:10 PM  Initial blood work is coming back.  BNP, troponin are within normal limits.  The patient's creatinine and BUN are slightly elevated.  Question if she may be slightly dry.  I spoke with the Medtronic device representative after their interrogation.  She had an SVT episode in March but no other arrhythmia more recently.  Her fluid levels are within parameters for the device. Plan for gentle IVF bolus here with some renal insufficiency and will transfer care to Dr. Sedonia Small to follow troponin and reassess after IVF.  ____________________________________________  FINAL CLINICAL IMPRESSION(S) / ED DIAGNOSES  Final diagnoses:  Hypotension, unspecified hypotension type  Precordial chest pain    MEDICATIONS GIVEN DURING THIS VISIT:  Medications  sodium chloride 0.9 % bolus 250 mL (250 mLs Intravenous New Bag/Given 09/05/20 2306)    Note:  This document was prepared using Dragon voice recognition software and may include unintentional dictation errors.  Nanda Quinton, MD, [facility] Emergency Medicine    Shaquile Lutze, [facility], MD 09/05/20 [phone]

## 2020-09-05 NOTE — ED Triage Notes (Signed)
Pt to er, pt states that she is having tunnel vision, states that this started around 5pm, states that she works over at the [employer] center.  States that she then took her bp and it was low.  Pt denies unilateral weakness, states that she has some back pain.

## 2020-09-06 LAB — TROPONIN I (HIGH SENSITIVITY): Troponin I (High Sensitivity): 7 ng/L (ref <18)

## 2020-09-06 NOTE — Discharge Instructions (Addendum)
You were evaluated in the Emergency Department and after careful evaluation, we did not find any emergent condition requiring admission or further testing in the hospital.  Your exam/testing today was overall reassuring.  Skip your Sunday dose of Lasix, take your dose of Lasix on Wednesday, and keep your appointment with cardiology on Thursday.  Please return to the Emergency Department if you experience any worsening of your condition.  Thank you for allowing [place] to be a part of your care.

## 2020-09-09 NOTE — Progress Notes (Addendum)
PCP: [name], MD HF Cardiology: [name]  60 y.o. with history of CAD, ischemic cardiomyopathy, and PAD was referred by [name] for evaluation of CHF.  She had NSTEMI in 11/17 with PCI to prox/mid LCx and RCA.  Subsequently, has developed ischemic cardiomyopathy.  Most recent echo in 1/21 showed EF 30-35%.  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   RHC/LHC done with exertional chest heaviness and dyspnea in 4/21 showed normal filling pressures, preserved cardiac output, and nonobstructive mild CAD.  ABIs were normal in 4/21.   In 8/21, she had syncope thought to be related to orthostasis from low BP in the setting of cardiac medication titration.  Entresto was decreased.   She was in the ER in 11/21 with chest pain.  Troponin and COVID-19 negative. Echo in 11/21 showed EF 30-35%, normal RV.   I saw her in the HF clinic 07/2020. Entresto was increased and lasix switched to every other day.   Evaluated in the ED 09/05/20 with chest pain and hypotension. BP soft. HS Trop negative. Instructed to hold a dose of lasix.  Today she returns for HF follow up.Overall feeling fine. Says when she walks around the grocery store. Denies PND/Orthopnea. No chest pain. Appetite fair. No fever or chills. Weight at home 117-120  pounds. Taking all medications.  Smoking 1 pack 3 weeks. Working full time as a Quarry manager at [employer].  She has 8 children at home.   Labs (2/21): K 3.7, creatinine 0.82 Labs (4/21): K 4.3, creatinine 0.81, LDL 67, TGs 286 Labs (6/21): K 4.7, creatinine 1.22 Labs (11/21): K 3.7, creatinine 0.84, hgb 12.6, LDL 67, HDL 48, TGs 286 Labs (1/22). K 4.4, creatinine 1.03 Labs (07/02/20): K 4.3 Creatinine 0.94 Labs ( 09/05/20): K 3.8 Creatinine 1.4    Medtronic device interrogation: Fluid index trending up and impedance down. Activity ~ 4 hours per day.  PMH:  1. CAD: NSTEMI with DES to proximal and mid LCx in 11/17, staged DES x 2 to RCA  later in 11/17.  - Cardiolite (10/20): EF < 30%, large inferior and inferolateral MI with mild peri-infarct ischemia.  - LHC (4/21): Patent stents, nonobstructive CAD.  2. Chronic systolic CHF: Ischemic cardiomyopathy. Medtronic ICD.  - Echo (10/20): EF 35-40%, lateral WMAs, normal RV. - Echo (1/21): EF 30-35%, mild LVH, normal RV - RHC (4/21): mean RA 5, PA 29/3, mean PCWP 12, CI 3.04 - Echo (11/21): EF 30-35%, normal RV.  3. 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  4. Carotid stenosis: Carotid dopplers (84/13) with 24-40% LICA stenosis.  - Carotid dopplers (6/21): 40-59% BICA stenosis.  - Carotid dopplers (1/22): 10-27% RICA, 25-36% LICA.  5. Active smoker.  6. PAD: ABIs (4/21) Normal.  - Peripheral artery dopplers (12/21): bilateral plaque without focal stenosis.   Social History   Socioeconomic History  . Marital status: Married    Spouse name: Not on file  . Number of children: Not on file  . Years of education: Not on file  . Highest education level: Not on file  Occupational History  . Occupation: CNA  Tobacco Use  . Smoking status: Current Some Day Smoker    Packs/day: 0.10    Years: 15.00    Pack years: 1.50    Types: Cigarettes  . Smokeless tobacco: Never Used  . Tobacco comment: smokes a cigarette occasionally  Vaping Use  . Vaping Use: Never used  Substance and Sexual Activity  .  Alcohol use: Yes    Comment: occasionally  . Drug use: Not Currently    Types: Marijuana    Comment: former- 2017 last time  . Sexual activity: Not Currently    Birth control/protection: Surgical    Comment: hyst  Other Topics Concern  . Not on file  Social History Narrative   Lives with husband in [place] in a one story home with a basement.  Has 4 children.  Works as a Quarry manager.  Education: CNA school.    Social Determinants of Health   Financial Resource Strain: Not on file  Food Insecurity: Not on file  Transportation Needs: Not on file   Physical Activity: Not on file  Stress: Not on file  Social Connections: Not on file  Intimate Partner Violence: Not on file   Family History  Problem Relation Age of Onset  . Diabetes Father   . Hypertension Father   . CAD Father   . Colon polyps Father 60       pre-cancerous   . Stroke Father   . Dementia Father   . Stroke Mother   . Hypertension Mother   . Diabetes Mother   . Heart failure Other   . Breast cancer Maternal Grandmother   . Colon cancer Neg Hx    ROS: All systems reviewed and negative except as per HPI.   Current Outpatient Medications  Medication Sig Dispense Refill  . acetaminophen (TYLENOL) 325 MG tablet Take 325 mg by mouth every 6 (six) hours as needed for mild pain or headache.     . ALPRAZolam (XANAX) 1 MG tablet Take 1-2 mg by mouth at bedtime.    [name] Kitchen aspirin EC 81 MG tablet Take 81 mg by mouth every evening.     [name] Kitchen atorvastatin (LIPITOR) 80 MG tablet TAKE 1 TABLET BY MOUTH ONCE DAILY IN THE EVENING AT  6PM 90 tablet 0  . carvedilol (COREG) 6.25 MG tablet Take 0.5 tablets (3.125 mg total) by mouth 2 (two) times daily. 30 tablet 6  . Cholecalciferol (VITAMIN D3) 10 MCG (400 UNIT) CAPS Take 1 capsule by mouth daily.    . clopidogrel (PLAVIX) 75 MG tablet Take 1 tablet by mouth once daily 30 tablet 0  . dapagliflozin propanediol (FARXIGA) 10 MG TABS tablet Take 10 mg by mouth daily before breakfast. 30 tablet 11  . ezetimibe (ZETIA) 10 MG tablet Take 1 tablet by mouth once daily 30 tablet 6  . furosemide (LASIX) 20 MG tablet Take 1 tablet (20 mg total) by mouth every other day. 45 tablet 3  . gabapentin (NEURONTIN) 100 MG capsule Take 100 mg by mouth 2 (two) times daily.     [name] Kitchen loperamide (IMODIUM) 2 MG capsule Take 2 mg by mouth 4 (four) times daily as needed for diarrhea or loose stools (ibs).     . LORazepam (ATIVAN) 1 MG tablet Take 1 mg by mouth daily as needed for anxiety.   1  . Multiple Vitamins-Minerals (MULTIVITAMIN WITH MINERALS) tablet Take 1  tablet by mouth daily.    . nicotine (NICODERM CQ - DOSED IN MG/24 HOURS) 21 mg/24hr patch Place 21 mg onto the skin at bedtime as needed.     . nitroGLYCERIN (NITROSTAT) 0.4 MG SL tablet Place 1 tablet (0.4 mg total) under the tongue every 5 (five) minutes x 3 doses as needed for chest pain (if no relief after 2nd dose, proceed to the ED for an evaluation or call 911). 25 tablet 2  . omeprazole (  PRILOSEC) 20 MG capsule Take 20 mg by mouth daily.     [name] Kitchen rOPINIRole (REQUIP) 0.5 MG tablet Take 0.5 mg by mouth at bedtime.   0  . sacubitril-valsartan (ENTRESTO) 97-103 MG Take 1 tablet by mouth 2 (two) times daily. 180 tablet 3  . spironolactone (ALDACTONE) 25 MG tablet Take 12.5 mg by mouth daily.    [name] Kitchen zinc gluconate 50 MG tablet Take 50 mg by mouth daily.     No current facility-administered medications for this encounter.   BP 100/70   Pulse 81   Wt 54 kg   SpO2 100%   BMI 23.24 kg/m   Wt Readings from Last 3 Encounters:  09/10/20 54 kg  09/05/20 52.2 kg  07/30/20 54.1 kg    General:  Well appearing. No resp difficulty HEENT: normal Neck: supple. no JVD. Carotids 2+ bilat; no bruits. No lymphadenopathy or thryomegaly appreciated. Cor: PMI nondisplaced. Regular rate & rhythm. No rubs, gallops or murmurs. Lungs: clear Abdomen: soft, nontender, nondistended. No hepatosplenomegaly. No bruits or masses. Good bowel sounds. Extremities: no cyanosis, clubbing, rash, edema Neuro: alert & orientedx3, cranial nerves grossly intact. moves all 4 extremities w/o difficulty. Affect pleasant  Assessment/Plan: 1. CAD: S/p NSTEMI in 11/17 with DEX to LCx and DES x 2 to RCA.  Cardiolite in 10/20 with EF <30%, inferior/inferolateral infarct with peri-infarct ischemia. LHC in 4/21 showed nonobstructive mild CAD.  She has Medtronic ICD.  -No chest pain.  - Continue atorvastatin and Zetia. Good LDL 11/21.  - Continue ASA 81 and Plavix 75 mg daily for now.  She will likely be a good candidate to replace  Plavix with rivaroxaban 2.5 mg bid in the future. She will continue Plavix as long as Dr. Estanislado Pandy feels it is needed post her cranial circulation intervention. She has follow up in June.  - no bleeding issues.  2. Chronic systolic CHF: Ischemic cardiomyopathy. Echo in 11/21 showed stable EF 30-35%.  RHC in 4/21 with normal filling pressures and preserved cardiac output. Has Medtronic ICD.   -Optivol - Fluid index trending up. Will need to continue lasix every other day.  NYHA III. Volume status stable.  - Continue entresto to 97-103 mg twice a day .   - Continue Coreg at 3.125 mg bid.    Would not increase with soft bp.  - Continue spironolactone 12.5 mg daily.  - Continue dapagliflozin 10 mg daily.   3. Carotid stenosis: Repeat carotid dopplers in 1/23.  4. Smoking: Discussed smoking cessation.  5. 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   Discussed and reviewed Optivol.   I reviewed ED visit and lab work completed at that time. Follow up in 3 months.   Hjalmer Iovino NP-C 09/10/2020

## 2020-09-10 ENCOUNTER — Encounter (HOSPITAL_COMMUNITY): Payer: Self-pay

## 2020-09-10 ENCOUNTER — Other Ambulatory Visit: Payer: Self-pay

## 2020-09-10 ENCOUNTER — Ambulatory Visit (HOSPITAL_COMMUNITY)
Admission: RE | Admit: 2020-09-10 | Discharge: 2020-09-10 | Disposition: A | Discharge status: Home / Self Care | Payer: 59 | Source: Ambulatory Visit | Attending: Adult Health | Admitting: Adult Health

## 2020-09-10 VITALS — BP 100/70 | HR 81 | Wt 119.0 lb

## 2020-09-10 DIAGNOSIS — Z7984 Long term (current) use of oral hypoglycemic drugs: Secondary | ICD-10-CM | POA: Diagnosis not present

## 2020-09-10 DIAGNOSIS — I5022 Chronic systolic (congestive) heart failure: Secondary | ICD-10-CM | POA: Insufficient documentation

## 2020-09-10 DIAGNOSIS — I5042 Chronic combined systolic (congestive) and diastolic (congestive) heart failure: Secondary | ICD-10-CM | POA: Diagnosis not present

## 2020-09-10 DIAGNOSIS — Z833 Family history of diabetes mellitus: Secondary | ICD-10-CM | POA: Diagnosis not present

## 2020-09-10 DIAGNOSIS — Z79899 Other long term (current) drug therapy: Secondary | ICD-10-CM | POA: Insufficient documentation

## 2020-09-10 DIAGNOSIS — Z8249 Family history of ischemic heart disease and other diseases of the circulatory system: Secondary | ICD-10-CM | POA: Diagnosis not present

## 2020-09-10 DIAGNOSIS — Z7982 Long term (current) use of aspirin: Secondary | ICD-10-CM | POA: Diagnosis not present

## 2020-09-10 DIAGNOSIS — F17210 Nicotine dependence, cigarettes, uncomplicated: Secondary | ICD-10-CM | POA: Diagnosis not present

## 2020-09-10 DIAGNOSIS — I255 Ischemic cardiomyopathy: Secondary | ICD-10-CM | POA: Insufficient documentation

## 2020-09-10 DIAGNOSIS — I252 Old myocardial infarction: Secondary | ICD-10-CM | POA: Insufficient documentation

## 2020-09-10 DIAGNOSIS — Z95810 Presence of automatic (implantable) cardiac defibrillator: Secondary | ICD-10-CM | POA: Diagnosis not present

## 2020-09-10 DIAGNOSIS — Z955 Presence of coronary angioplasty implant and graft: Secondary | ICD-10-CM | POA: Insufficient documentation

## 2020-09-10 DIAGNOSIS — Z7902 Long term (current) use of antithrombotics/antiplatelets: Secondary | ICD-10-CM | POA: Diagnosis not present

## 2020-09-10 DIAGNOSIS — I2510 Atherosclerotic heart disease of native coronary artery without angina pectoris: Secondary | ICD-10-CM | POA: Diagnosis not present

## 2020-09-10 DIAGNOSIS — Z720 Tobacco use: Secondary | ICD-10-CM

## 2020-09-10 NOTE — Patient Instructions (Signed)
It was great to see you today! No medication changes are needed at this time.   Your physician recommends that you schedule a follow-up appointment in: 3 months with Dr McLean  Do the following things EVERYDAY: 1) Weigh yourself in the morning before breakfast. Write it down and keep it in a log. 2) Take your medicines as prescribed 3) Eat low salt foods--Limit salt (sodium) to 2000 mg per day.  4) Stay as active as you can everyday 5) Limit all fluids for the day to less than 2 liters  At [facility], you and your health needs are our priority. As part of our continuing mission to provide you with exceptional heart care, we have created designated Provider Care Teams. These Care Teams include your primary Cardiologist (physician) and Advanced Practice Providers (APPs- Physician Assistants and Nurse Practitioners) who all work together to provide you with the care you need, when you need it.   You may see any of the following providers on your designated Care Team at your next follow up: . Dr Daniel Bensimhon . Dr Dalton McLean . Dr Brandon Winfrey . Amy Clegg, NP . Brittainy Simmons, PA . Jessica Milford,NP . Lauren Kemp, PharmD   Please be sure to bring in all your medications bottles to every appointment.   If you have any questions or concerns before your next appointment please send us a message through mychart or call our office at [phone].    TO LEAVE A MESSAGE FOR THE NURSE SELECT OPTION 2, PLEASE LEAVE A MESSAGE INCLUDING: . YOUR NAME . DATE OF BIRTH . CALL BACK NUMBER . REASON FOR CALL**this is important as we prioritize the call backs  YOU WILL RECEIVE A CALL BACK THE SAME DAY AS LONG AS YOU CALL BEFORE 4:00 PM   

## 2020-09-28 ENCOUNTER — Ambulatory Visit (INDEPENDENT_AMBULATORY_CARE_PROVIDER_SITE_OTHER): Payer: 59

## 2020-09-28 DIAGNOSIS — Z95810 Presence of automatic (implantable) cardiac defibrillator: Secondary | ICD-10-CM | POA: Diagnosis not present

## 2020-09-28 DIAGNOSIS — I5042 Chronic combined systolic (congestive) and diastolic (congestive) heart failure: Secondary | ICD-10-CM | POA: Diagnosis not present

## 2020-09-28 NOTE — Progress Notes (Signed)
EPIC Encounter for ICM Monitoring  Patient Name: Erin [name] is a 60 y.o. female Date: 09/28/2020 Primary Care Physican: [name], MD Primary Cardiologist: Aundra Dubin Electrophysiologist: Curt Bears 09/28/2020 Weight: 116 lbs                                                            Spoke with patient and reports feeling Erin [name] of breath walking around the house.  No lower extremity swelling or weight gain.  Confirmed she takes Furosemide 20 mg every other day.   Optivol thoracic impedance normal suggesting possible fluid accumulation since 09/14/2020.  Prescribed:   Furosemide 20 mg take 1 tablet every other day.    Spironolactone 25 mg take 0.5 tablet daily.  Labs: 09/05/2020 Creatinine 1.37, BUN 23, Potassium 3.8, Sodium 135, GFR 45 08/06/2020 Creatinine 0.89, BUN 15, Potassium 3.9, Sodium 137, GFR >60  07/02/2020 Creatinine 0.94, BUN 16, Potassium 4.3, Sodium 137, GFR >60  A complete set of results can be found in Results Review.  Recommendations:   Copy sent to Dr Aundra Dubin for review and recommendations if needed.  Follow-up plan: ICM clinic phone appointment on 10/06/2020 to recheck fluid levels.   91 day device clinic remote transmission 10/06/2020.    EP/Cardiology Office Visits:  12/10/2020 with Dr Aundra Dubin  Copy of ICM check sent to Dr. Curt Bears.   3 month ICM trend: 09/28/2020.    1 Year ICM trend:       Rosalene Billings, RN 09/28/2020 1:41 PM

## 2020-09-29 ENCOUNTER — Other Ambulatory Visit: Payer: Self-pay | Admitting: Family Medicine

## 2020-09-29 ENCOUNTER — Other Ambulatory Visit (HOSPITAL_COMMUNITY): Payer: Self-pay | Admitting: Cardiology

## 2020-09-29 DIAGNOSIS — I5042 Chronic combined systolic (congestive) and diastolic (congestive) heart failure: Secondary | ICD-10-CM

## 2020-09-30 MED ORDER — FUROSEMIDE 20 MG PO TABS: 20.0000 mg | ORAL_TABLET | Freq: Every day | ORAL | 3 refills | Status: DC

## 2020-09-30 NOTE — Progress Notes (Signed)
Increase Lasix to 20 mg every day, BMET 1 week.

## 2020-09-30 NOTE — Progress Notes (Signed)
Spoke with patient.    Advised [name] recommended to increase Furosemide to 20 mg daily.   Also advised to have BMET lab drawn in 1 week and labs will be drawn at [facility] on [address].  BMET order faxed to [place].  Patient verbalized understanding.    Confirmed pharmacy.  Patient needs refill and escript sent to preferred pharamcy.   Next ICM remote transmission scheduled 10/06/2020 to recheck fluid levels.  Encouraged to call back if symptoms do not improve.

## 2020-10-06 ENCOUNTER — Ambulatory Visit (INDEPENDENT_AMBULATORY_CARE_PROVIDER_SITE_OTHER): Payer: 59

## 2020-10-06 DIAGNOSIS — I255 Ischemic cardiomyopathy: Secondary | ICD-10-CM

## 2020-10-06 DIAGNOSIS — Z95810 Presence of automatic (implantable) cardiac defibrillator: Secondary | ICD-10-CM

## 2020-10-06 DIAGNOSIS — I5042 Chronic combined systolic (congestive) and diastolic (congestive) heart failure: Secondary | ICD-10-CM

## 2020-10-06 NOTE — Progress Notes (Signed)
EPIC Encounter for ICM Monitoring  Patient Name: Erin [name] is a 60 y.o. female Date: 10/06/2020 Primary Care Physican: [name], MD Primary Cardiologist:McLean Electrophysiologist:Camnitz 09/28/2020 Weight:116lbs  10/06/2020 Weight: 119 lbs   Spoke with patient and reports feeling fine.   She is taking Furosemide daily as Dr Aundra Dubin ordered.   Optivol thoracic impedance normal suggesting fluid levels improving since Furosemide changed to daily.  Impedance trending close to baseline  Prescribed:   Furosemide20 mg take 1 tablet daily (increased 09/30/20).   Spironolactone 25 mg take 0.5 tablet daily.  Labs: BMET scheduled for 10/07/2020 09/05/2020 Creatinine 1.37, BUN 23, Potassium 3.8, Sodium 135, GFR 45 08/06/2020 Creatinine 0.89, BUN 15, Potassium 3.9, Sodium 137, GFR >60  07/02/2020 Creatinine 0.94, BUN 16, Potassium 4.3, Sodium 137, GFR >60  A complete set of results can be found in Results Review.  Recommendations:Recommendation to limit salt intake to 2000 mg daily and fluid intake to 64 oz daily.  Encouraged to call if experiencing any fluid symptoms.   Follow-up plan: ICM clinic phone appointment on6/13/2022 to recheck fluid levels. 91 day device clinic remote transmission 01/04/2021.   EP/Cardiology Office Visits: 12/10/2020 with Dr Aundra Dubin  Copy of ICM check sent to [facility].   3 month ICM trend: 10/06/2020.    1 Year ICM trend:       Rosalene Billings, RN 10/06/2020 4:09 PM

## 2020-10-07 ENCOUNTER — Other Ambulatory Visit: Payer: Self-pay | Admitting: Family Medicine

## 2020-10-07 ENCOUNTER — Other Ambulatory Visit: Payer: Self-pay | Admitting: Cardiology

## 2020-10-07 LAB — CUP PACEART REMOTE DEVICE CHECK
Battery Remaining Longevity: 135 mo
Battery Voltage: 3.08 V
Brady Statistic RV Percent Paced: 0 %
Date Time Interrogation Session: 20220531031705
HighPow Impedance: 85 Ohm
Implantable Lead Implant Date: 20211126
Implantable Lead Location: 753860
Implantable Pulse Generator Implant Date: 20211126
Lead Channel Impedance Value: 456 Ohm
Lead Channel Impedance Value: 570 Ohm
Lead Channel Pacing Threshold Amplitude: 0.875 V
Lead Channel Pacing Threshold Pulse Width: 0.4 ms
Lead Channel Sensing Intrinsic Amplitude: 4.75 mV
Lead Channel Sensing Intrinsic Amplitude: 4.75 mV
Lead Channel Setting Pacing Amplitude: 2 V
Lead Channel Setting Pacing Pulse Width: 0.4 ms
Lead Channel Setting Sensing Sensitivity: 0.3 mV

## 2020-10-08 LAB — BASIC METABOLIC PANEL
BUN/Creatinine Ratio: 17 (ref 9–23)
BUN: 18 mg/dL (ref 6–24)
CO2: 22 mmol/L (ref 20–29)
Calcium: 9.8 mg/dL (ref 8.7–10.2)
Chloride: 105 mmol/L (ref 96–106)
Creatinine, Ser: 1.03 mg/dL — ABNORMAL HIGH (ref 0.57–1.00)
Glucose: 85 mg/dL (ref 65–99)
Potassium: 4.4 mmol/L (ref 3.5–5.2)
Sodium: 142 mmol/L (ref 134–144)
eGFR: 63 mL/min/{1.73_m2} (ref >59)

## 2020-10-14 ENCOUNTER — Other Ambulatory Visit: Payer: Self-pay | Admitting: Family Medicine

## 2020-10-14 ENCOUNTER — Telehealth: Payer: Self-pay

## 2020-10-14 NOTE — Telephone Encounter (Signed)
Returned patient call per voice mail request.  She reports having left sided weakness in her arm and leg causing her to fall on Saturday night around 8 pm. The weakness lasted for about 5 minutes and then resolved.  She has not had any chest pain or shortness of breath.  She did not have any other symptoms besides weakness has resolved.  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    Remote transmission received and reviewed.  Advised no abnormal episodes on report but possible fluid accumulation starting today.   Advised to contact the neurologist for follow up or use ER if needed for any further episodes.

## 2020-10-19 ENCOUNTER — Encounter (HOSPITAL_COMMUNITY): Payer: Self-pay | Admitting: Emergency Medicine

## 2020-10-19 ENCOUNTER — Other Ambulatory Visit: Payer: Self-pay

## 2020-10-19 ENCOUNTER — Ambulatory Visit (INDEPENDENT_AMBULATORY_CARE_PROVIDER_SITE_OTHER): Payer: 59

## 2020-10-19 ENCOUNTER — Inpatient Hospital Stay (HOSPITAL_COMMUNITY)
Admission: EM | Admit: 2020-10-19 | Discharge: 2020-10-22 | DRG: 092 | Disposition: A | Discharge status: Home / Self Care | Payer: 59 | Attending: Internal Medicine | Admitting: Internal Medicine

## 2020-10-19 ENCOUNTER — Emergency Department (HOSPITAL_COMMUNITY): Payer: 59

## 2020-10-19 DIAGNOSIS — E785 Hyperlipidemia, unspecified: Secondary | ICD-10-CM | POA: Diagnosis present

## 2020-10-19 DIAGNOSIS — R200 Anesthesia of skin: Secondary | ICD-10-CM | POA: Diagnosis present

## 2020-10-19 DIAGNOSIS — Z7984 Long term (current) use of oral hypoglycemic drugs: Secondary | ICD-10-CM

## 2020-10-19 DIAGNOSIS — Z803 Family history of malignant neoplasm of breast: Secondary | ICD-10-CM

## 2020-10-19 DIAGNOSIS — I110 Hypertensive heart disease with heart failure: Secondary | ICD-10-CM | POA: Diagnosis present

## 2020-10-19 DIAGNOSIS — E119 Type 2 diabetes mellitus without complications: Secondary | ICD-10-CM | POA: Diagnosis present

## 2020-10-19 DIAGNOSIS — Z7982 Long term (current) use of aspirin: Secondary | ICD-10-CM

## 2020-10-19 DIAGNOSIS — Z823 Family history of stroke: Secondary | ICD-10-CM

## 2020-10-19 DIAGNOSIS — I639 Cerebral infarction, unspecified: Secondary | ICD-10-CM | POA: Diagnosis not present

## 2020-10-19 DIAGNOSIS — Z85828 Personal history of other malignant neoplasm of skin: Secondary | ICD-10-CM

## 2020-10-19 DIAGNOSIS — I252 Old myocardial infarction: Secondary | ICD-10-CM

## 2020-10-19 DIAGNOSIS — I5042 Chronic combined systolic (congestive) and diastolic (congestive) heart failure: Secondary | ICD-10-CM

## 2020-10-19 DIAGNOSIS — Z8701 Personal history of pneumonia (recurrent): Secondary | ICD-10-CM

## 2020-10-19 DIAGNOSIS — Z90710 Acquired absence of both cervix and uterus: Secondary | ICD-10-CM

## 2020-10-19 DIAGNOSIS — Z833 Family history of diabetes mellitus: Secondary | ICD-10-CM

## 2020-10-19 DIAGNOSIS — W57XXXA Bitten or stung by nonvenomous insect and other nonvenomous arthropods, initial encounter: Secondary | ICD-10-CM | POA: Diagnosis present

## 2020-10-19 DIAGNOSIS — Z95810 Presence of automatic (implantable) cardiac defibrillator: Secondary | ICD-10-CM

## 2020-10-19 DIAGNOSIS — Z8673 Personal history of transient ischemic attack (TIA), and cerebral infarction without residual deficits: Secondary | ICD-10-CM

## 2020-10-19 DIAGNOSIS — K219 Gastro-esophageal reflux disease without esophagitis: Secondary | ICD-10-CM | POA: Diagnosis present

## 2020-10-19 DIAGNOSIS — R202 Paresthesia of skin: Principal | ICD-10-CM | POA: Diagnosis present

## 2020-10-19 DIAGNOSIS — Z7902 Long term (current) use of antithrombotics/antiplatelets: Secondary | ICD-10-CM

## 2020-10-19 DIAGNOSIS — Z20822 Contact with and (suspected) exposure to covid-19: Secondary | ICD-10-CM | POA: Diagnosis present

## 2020-10-19 DIAGNOSIS — R911 Solitary pulmonary nodule: Secondary | ICD-10-CM | POA: Diagnosis present

## 2020-10-19 DIAGNOSIS — I255 Ischemic cardiomyopathy: Secondary | ICD-10-CM | POA: Diagnosis present

## 2020-10-19 DIAGNOSIS — Z79899 Other long term (current) drug therapy: Secondary | ICD-10-CM

## 2020-10-19 DIAGNOSIS — F17210 Nicotine dependence, cigarettes, uncomplicated: Secondary | ICD-10-CM | POA: Diagnosis present

## 2020-10-19 DIAGNOSIS — R2990 Unspecified symptoms and signs involving the nervous system: Secondary | ICD-10-CM

## 2020-10-19 DIAGNOSIS — Z8371 Family history of colonic polyps: Secondary | ICD-10-CM

## 2020-10-19 DIAGNOSIS — I2510 Atherosclerotic heart disease of native coronary artery without angina pectoris: Secondary | ICD-10-CM | POA: Diagnosis present

## 2020-10-19 DIAGNOSIS — R531 Weakness: Secondary | ICD-10-CM

## 2020-10-19 DIAGNOSIS — Z955 Presence of coronary angioplasty implant and graft: Secondary | ICD-10-CM

## 2020-10-19 DIAGNOSIS — F411 Generalized anxiety disorder: Secondary | ICD-10-CM | POA: Diagnosis present

## 2020-10-19 DIAGNOSIS — Z9049 Acquired absence of other specified parts of digestive tract: Secondary | ICD-10-CM

## 2020-10-19 DIAGNOSIS — Z8249 Family history of ischemic heart disease and other diseases of the circulatory system: Secondary | ICD-10-CM

## 2020-10-19 DIAGNOSIS — Z8679 Personal history of other diseases of the circulatory system: Secondary | ICD-10-CM

## 2020-10-19 DIAGNOSIS — I6522 Occlusion and stenosis of left carotid artery: Secondary | ICD-10-CM | POA: Diagnosis present

## 2020-10-19 LAB — CBC
HCT: 38.7 % (ref 36.0–46.0)
Hemoglobin: 12.8 g/dL (ref 12.0–15.0)
MCH: 29.5 pg (ref 26.0–34.0)
MCHC: 33.1 g/dL (ref 30.0–36.0)
MCV: 89.2 fL (ref 80.0–100.0)
Platelets: 400 10*3/uL (ref 150–400)
RBC: 4.34 MIL/uL (ref 3.87–5.11)
RDW: 15.5 % (ref 11.5–15.5)
WBC: 8.6 10*3/uL (ref 4.0–10.5)
nRBC: 0 % (ref 0.0–0.2)

## 2020-10-19 LAB — PROTIME-INR
INR: 0.9 (ref 0.8–1.2)
Prothrombin Time: 12.6 seconds (ref 11.4–15.2)

## 2020-10-19 LAB — COMPREHENSIVE METABOLIC PANEL
ALT: 22 U/L (ref 0–44)
AST: 27 U/L (ref 15–41)
Albumin: 4.2 g/dL (ref 3.5–5.0)
Alkaline Phosphatase: 71 U/L (ref 38–126)
Anion gap: 10 (ref 5–15)
BUN: 21 mg/dL — ABNORMAL HIGH (ref 6–20)
CO2: 25 mmol/L (ref 22–32)
Calcium: 9.1 mg/dL (ref 8.9–10.3)
Chloride: 101 mmol/L (ref 98–111)
Creatinine, Ser: 1.22 mg/dL — ABNORMAL HIGH (ref 0.44–1.00)
GFR, Estimated: 51 mL/min — ABNORMAL LOW (ref >60)
Glucose, Bld: 92 mg/dL (ref 70–99)
Potassium: 4 mmol/L (ref 3.5–5.1)
Sodium: 136 mmol/L (ref 135–145)
Total Bilirubin: 1.1 mg/dL (ref 0.3–1.2)
Total Protein: 7.2 g/dL (ref 6.5–8.1)

## 2020-10-19 LAB — DIFFERENTIAL
Abs Immature Granulocytes: 0.04 10*3/uL (ref 0.00–0.07)
Basophils Absolute: 0.1 10*3/uL (ref 0.0–0.1)
Basophils Relative: 1 %
Eosinophils Absolute: 0.1 10*3/uL (ref 0.0–0.5)
Eosinophils Relative: 1 %
Immature Granulocytes: 1 %
Lymphocytes Relative: 30 %
Lymphs Abs: 2.6 10*3/uL (ref 0.7–4.0)
Monocytes Absolute: 0.7 10*3/uL (ref 0.1–1.0)
Monocytes Relative: 8 %
Neutro Abs: 5.1 10*3/uL (ref 1.7–7.7)
Neutrophils Relative %: 59 %

## 2020-10-19 LAB — RESP PANEL BY RT-PCR (FLU A&B, COVID) ARPGX2
Influenza A by PCR: NEGATIVE
Influenza B by PCR: NEGATIVE
SARS Coronavirus 2 by RT PCR: NEGATIVE

## 2020-10-19 LAB — ETHANOL: Alcohol, Ethyl (B): <10 mg/dL (ref <10)

## 2020-10-19 LAB — APTT: aPTT: 30 seconds (ref 24–36)

## 2020-10-19 LAB — CBG MONITORING, ED: Glucose-Capillary: 77 mg/dL (ref 70–99)

## 2020-10-19 MED ORDER — EZETIMIBE 10 MG PO TABS: 10.0000 mg | ORAL_TABLET | Freq: Every day | ORAL | Status: DC

## 2020-10-19 MED ORDER — IOHEXOL 350 MG/ML SOLN: 75.0000 mL | Freq: Once | INTRAVENOUS | Status: DC | PRN

## 2020-10-19 MED ORDER — FUROSEMIDE 40 MG PO TABS: 20.0000 mg | ORAL_TABLET | Freq: Every day | ORAL | Status: DC

## 2020-10-19 MED ORDER — EZETIMIBE 10 MG PO TABS
10.0000 mg | ORAL_TABLET | Freq: Every day | ORAL | Status: DC
  Administered 2020-10-20 – 2020-10-22 (×3): 10 mg via ORAL
  Filled 2020-10-19 (×3): qty 1

## 2020-10-19 MED ORDER — ACETAMINOPHEN 650 MG RE SUPP: 650.0000 mg | RECTAL | Status: DC | PRN

## 2020-10-19 MED ORDER — IOHEXOL 350 MG/ML SOLN
25.0000 mL | Freq: Once | INTRAVENOUS | Status: AC | PRN
  Administered 2020-10-19: 25 mL via INTRAVENOUS

## 2020-10-19 MED ORDER — FUROSEMIDE 20 MG PO TABS: 20.0000 mg | ORAL_TABLET | Freq: Every day | ORAL | Status: DC

## 2020-10-19 MED ORDER — INSULIN ASPART 100 UNIT/ML IJ SOLN
0.0000 [IU] | Freq: Every day | INTRAMUSCULAR | Status: DC
Start: 2020-10-19 — End: 2020-10-20

## 2020-10-19 MED ORDER — SENNOSIDES-DOCUSATE SODIUM 8.6-50 MG PO TABS: 1.0000 | ORAL_TABLET | Freq: Every evening | ORAL | Status: DC | PRN

## 2020-10-19 MED ORDER — ATORVASTATIN CALCIUM 80 MG PO TABS
80.0000 mg | ORAL_TABLET | Freq: Every day | ORAL | Status: DC
  Administered 2020-10-19 – 2020-10-22 (×4): 80 mg via ORAL
  Filled 2020-10-19: qty 2
  Filled 2020-10-19 (×3): qty 1

## 2020-10-19 MED ORDER — DOXYCYCLINE HYCLATE 100 MG IV SOLR
200.0000 mg | Freq: Once | INTRAVENOUS | Status: AC
  Administered 2020-10-19: 200 mg via INTRAVENOUS
  Filled 2020-10-19: qty 200

## 2020-10-19 MED ORDER — GABAPENTIN 100 MG PO CAPS
200.0000 mg | ORAL_CAPSULE | Freq: Every day | ORAL | Status: DC
  Administered 2020-10-19 – 2020-10-21 (×3): 200 mg via ORAL
  Filled 2020-10-19 (×3): qty 2

## 2020-10-19 MED ORDER — CARVEDILOL 3.125 MG PO TABS
3.1250 mg | ORAL_TABLET | Freq: Two times a day (BID) | ORAL | Status: DC
  Administered 2020-10-19 – 2020-10-22 (×6): 3.125 mg via ORAL
  Filled 2020-10-19 (×6): qty 1

## 2020-10-19 MED ORDER — PANTOPRAZOLE SODIUM 40 MG PO TBEC
40.0000 mg | DELAYED_RELEASE_TABLET | Freq: Every day | ORAL | Status: DC
  Administered 2020-10-20 – 2020-10-22 (×3): 40 mg via ORAL
  Filled 2020-10-19: qty 1
  Filled 2020-10-19: qty 2
  Filled 2020-10-19: qty 1

## 2020-10-19 MED ORDER — SODIUM CHLORIDE 0.9 % IV BOLUS
500.0000 mL | Freq: Once | INTRAVENOUS | Status: AC
  Administered 2020-10-19: 500 mL via INTRAVENOUS

## 2020-10-19 MED ORDER — ROPINIROLE HCL 1 MG PO TABS
0.5000 mg | ORAL_TABLET | Freq: Every day | ORAL | Status: DC
  Administered 2020-10-19 – 2020-10-21 (×3): 0.5 mg via ORAL
  Filled 2020-10-19: qty 1
  Filled 2020-10-19: qty 2
  Filled 2020-10-19: qty 1

## 2020-10-19 MED ORDER — SACUBITRIL-VALSARTAN 97-103 MG PO TABS
1.0000 | ORAL_TABLET | Freq: Two times a day (BID) | ORAL | Status: DC
  Administered 2020-10-19: 1 via ORAL
  Filled 2020-10-19 (×2): qty 1

## 2020-10-19 MED ORDER — SODIUM CHLORIDE 0.9 % IV SOLN
100.0000 mL/h | INTRAVENOUS | Status: DC
  Administered 2020-10-19 – 2020-10-20 (×2): 100 mL/h via INTRAVENOUS

## 2020-10-19 MED ORDER — ACETAMINOPHEN 325 MG PO TABS
650.0000 mg | ORAL_TABLET | ORAL | Status: DC | PRN
  Administered 2020-10-21 – 2020-10-22 (×2): 650 mg via ORAL
  Filled 2020-10-19 (×2): qty 2

## 2020-10-19 MED ORDER — ASPIRIN EC 81 MG PO TBEC
81.0000 mg | DELAYED_RELEASE_TABLET | Freq: Every evening | ORAL | Status: DC
  Administered 2020-10-19 – 2020-10-21 (×3): 81 mg via ORAL
  Filled 2020-10-19 (×3): qty 1

## 2020-10-19 MED ORDER — SPIRONOLACTONE 12.5 MG HALF TABLET
12.5000 mg | ORAL_TABLET | Freq: Every day | ORAL | Status: DC
  Administered 2020-10-20 – 2020-10-22 (×3): 12.5 mg via ORAL
  Filled 2020-10-19 (×5): qty 1

## 2020-10-19 MED ORDER — INSULIN ASPART 100 UNIT/ML IJ SOLN
0.0000 [IU] | Freq: Three times a day (TID) | INTRAMUSCULAR | Status: DC
Start: 2020-10-20 — End: 2020-10-20

## 2020-10-19 MED ORDER — ENOXAPARIN SODIUM 30 MG/0.3ML IJ SOSY
30.0000 mg | PREFILLED_SYRINGE | INTRAMUSCULAR | Status: DC
  Administered 2020-10-19: 30 mg via SUBCUTANEOUS

## 2020-10-19 MED ORDER — STROKE: EARLY STAGES OF RECOVERY BOOK
Freq: Once | Status: DC
  Filled 2020-10-19: qty 1

## 2020-10-19 MED ORDER — SODIUM CHLORIDE 0.9 % IV SOLN
INTRAVENOUS | Status: AC
  Filled 2020-10-19 (×2): qty 100

## 2020-10-19 MED ORDER — LORAZEPAM 1 MG PO TABS
1.0000 mg | ORAL_TABLET | Freq: Every day | ORAL | Status: DC
  Administered 2020-10-19 – 2020-10-21 (×3): 1 mg via ORAL
  Filled 2020-10-19 (×3): qty 1

## 2020-10-19 MED ORDER — CLOPIDOGREL BISULFATE 75 MG PO TABS
75.0000 mg | ORAL_TABLET | Freq: Every day | ORAL | Status: DC
  Administered 2020-10-19 – 2020-10-22 (×4): 75 mg via ORAL
  Filled 2020-10-19 (×4): qty 1

## 2020-10-19 MED ORDER — ACETAMINOPHEN 160 MG/5ML PO SOLN: 650.0000 mg | ORAL | Status: DC | PRN

## 2020-10-19 NOTE — H&P (Signed)
TRH H&P    Patient Demographics:    Erin [name], is a 60 y.o. female  MRN: [id number]  DOB - 02-Sep-1960  Admit Date - 10/19/2020  Referring MD/NP/PA: Tomi Bamberger  Outpatient Primary MD for the patient is [name], MD  Patient coming from: home  Chief complaint- left sided numbness and weakness   HPI:    Erin [name]  is a 60 y.o. female, with history of anxiety, brain aneurysm status post repair, CHF, coronary artery, tobacco abuse, and more presents the ED with a chief complaint of left-sided numbness and weakness.  Of note patient is a current employee at the [employer].  Patient reports that on 11 June she felt left-sided numbness became acutely and caused her to fall.  She has bruises on both upper extremities from the fall.  She reports she did not hit her head, did not lose consciousness.  Patient reports that at that time the numbness lasted for about 5 minutes.  Since then she has had paresthesias constantly in the left upper and lower extremity.  She reports that it felt the same as it did after her heart attack, so she did not think much of it.  Her fall was not preceded by chest pain, palpitations, dizziness.  She reports it felt like somebody snapped her leg out from underneath her.  Nothing makes her symptoms worse, hot bath makes them better.  She does report 0.3 ticks off of her on the same day that this happened.  One was embedded, not engorged, the other 2 were not attached.  She is not sure how long the ticks were on her.  Patient reports chronic nausea and vomiting that she attributes to her increased Entresto dose.  She has no appetite with 16 pound weight loss.  She has no other complaints.  Patient smokes a pack per week, drinks 3 glasses of wine per week.  She does not use illicit drugs.  She is vaccinated for COVID.  She is full code.  In the ED Temp 98.3, heart rate 71-96, respiratory rate  19-25, blood pressure 101/80 saturating at 96% on room air No leukocytosis with a white blood cell count of 8.6, hemoglobin 12.8 Chemistry panel is unremarkable aside from a slight bump in creatinine to 1.22 which does not meet criteria for AKI Negative respiratory panel Alcohol level less than 10 CTA head and neck do not show any acute changes EKG shows a heart rate of 79, QTc 450 Admission requested for stroke work-up, patient does have pacemaker and will need MRI so she will be [facility]      Review of systems:    In addition to the HPI above,  No Fever-chills, No Headache, No changes with Vision or hearing, No problems swallowing food or Liquids, No Chest pain, Cough or Shortness of Breath, No Abdominal pain, No Nausea or Vomiting, bowel movements are regular, No Blood in stool or Urine, No dysuria, No new skin rashes or bruises, No new joints pains-aches,  Endorses numbness, tingling in the left  upper and lower extremity Endorses weight loss No polyuria, polydypsia or polyphagia, No significant Mental Stressors.  All other systems reviewed and are negative.    Past History of the following :    Past Medical History:  Diagnosis Date   Anxiety state 03/25/2016   Basal cell carcinoma of forehead    Brain aneurysm    CHF (congestive heart failure) (HCC)    Coronary artery disease    a. 03/11/16 PCI with DES-->Prox/Mid Cx;  b. 03/14/16 PCI with DES x2-->RCA, EF 30-35%.   Essential hypertension    GERD (gastroesophageal reflux disease)    HFrEF (heart failure with reduced ejection fraction) ([place])    a. 10/2016 Echo: EF 35-40%, Gr1 DD, mild focal basal septal hypertrophy, basal inflat, mid inflat, basal antlat AK. Mid infept/inf/antlat, apical lateral sev HK. Mod MR. mildly reduced RV fxn. Mild TR.   History of pneumonia    Hyperlipidemia    IBS (irritable bowel syndrome)    Ischemic cardiomyopathy    a. 10/2016 Echo: EF 35-40%, Gr1 DD.   Mitral  regurgitation    NSTEMI (non-ST elevated myocardial infarction) ([place]) 03/10/2016   Pneumonia 03/2016   Squamous cell cancer of skin of nose    Thrombocytosis 03/26/2016   Tobacco abuse    Trichimoniasis    Wears dentures    Wears glasses       Past Surgical History:  Procedure Laterality Date   APPENDECTOMY     BIOPSY  09/20/2018   Procedure: BIOPSY;  Surgeon: Daneil Dolin, MD;  [facility];  Service: Endoscopy;;  colon   CARDIAC CATHETERIZATION N/A 03/11/2016   Procedure: Left Heart Cath and Coronary Angiography;  Surgeon: Leonie Man, MD;  Location: [place] [facility];  Service: Cardiovascular;  Laterality: N/A;   CARDIAC CATHETERIZATION N/A 03/11/2016   Procedure: Coronary Stent Intervention;  Surgeon: Leonie Man, MD;  Location: [place] [facility];  Service: Cardiovascular;  Laterality: N/A;   CARDIAC CATHETERIZATION N/A 03/14/2016   Procedure: Coronary Stent Intervention;  Surgeon: Peter M Martinique, MD;  Location: [place] [facility];  Service: Cardiovascular;  Laterality: N/A;   CHOLECYSTECTOMY OPEN  1984   COLONOSCOPY WITH PROPOFOL N/A 09/20/2018   Procedure: COLONOSCOPY WITH PROPOFOL;  Surgeon: Daneil Dolin, MD;  [facility];  Service: Endoscopy;  Laterality: N/A;  10:30am   CORONARY ANGIOPLASTY WITH STENT PLACEMENT  03/14/2016   ESOPHAGOGASTRODUODENOSCOPY (EGD) WITH PROPOFOL N/A 09/20/2018   Procedure: ESOPHAGOGASTRODUODENOSCOPY (EGD) WITH PROPOFOL;  Surgeon: Daneil Dolin, MD;  [facility];  Service: Endoscopy;  Laterality: N/A;   FINGER ARTHROPLASTY Left 05/14/2013   Procedure: LEFT THUMB CARPAL METACARPAL ARTHROPLASTY;  Surgeon: Tennis Must, MD;  Location: [place];  Service: Orthopedics;  Laterality: Left;   ICD IMPLANT N/A 04/03/2020   Procedure: ICD IMPLANT;  Surgeon: Constance Haw, MD;  Location: [place] [facility];  Service: Cardiovascular;  Laterality: N/A;   IR ANGIO INTRA EXTRACRAN SEL COM CAROTID  INNOMINATE BILAT MOD SED  01/05/2017   IR ANGIO INTRA EXTRACRAN SEL COM CAROTID INNOMINATE BILAT MOD SED  03/19/2019   IR ANGIO INTRA EXTRACRAN SEL COM CAROTID INNOMINATE BILAT MOD SED  06/04/2020   IR ANGIO INTRA EXTRACRAN SEL INTERNAL CAROTID UNI L MOD SED  04/15/2019   IR ANGIO VERTEBRAL SEL VERTEBRAL BILAT MOD SED  01/05/2017   IR ANGIO VERTEBRAL SEL VERTEBRAL BILAT MOD SED  03/19/2019   IR ANGIO VERTEBRAL SEL VERTEBRAL UNI  L MOD SED  06/04/2020   IR ANGIOGRAM FOLLOW UP STUDY  04/15/2019   IR RADIOLOGIST EVAL & MGMT  12/30/2016   IR TRANSCATH/EMBOLIZ  04/15/2019   IR US GUIDE VASC ACCESS RIGHT  03/19/2019   IR US GUIDE VASC ACCESS RIGHT  06/04/2020   MALONEY DILATION N/A 09/20/2018   Procedure: Venia Minks DILATION;  Surgeon: Daneil Dolin, MD;  [facility];  Service: Endoscopy;  Laterality: N/A;   RADIOLOGY WITH ANESTHESIA N/A 04/15/2019   Procedure: [employer];  Surgeon: Luanne Bras, MD;  Location: [place];  Service: Radiology;  Laterality: N/A;   RIGHT/LEFT HEART CATH AND CORONARY ANGIOGRAPHY N/A 08/19/2019   Procedure: RIGHT/LEFT HEART CATH AND CORONARY ANGIOGRAPHY;  Surgeon: Larey Dresser, MD;  Location: [place] [facility];  Service: Cardiovascular;  Laterality: N/A;   TUBAL LIGATION  1987   VAGINAL HYSTERECTOMY  2009      Social History:      Social History   Tobacco Use   Smoking status: Some Days    Packs/day: 0.10    Years: 15.00    Pack years: 1.50    Types: Cigarettes   Smokeless tobacco: Never   Tobacco comments:    smokes a cigarette occasionally  Substance Use Topics   Alcohol use: Yes    Comment: occasionally       Family History :     Family History  Problem Relation Age of Onset   Diabetes Father    Hypertension Father    CAD Father    Colon polyps Father 19       pre-cancerous    Stroke Father    Dementia Father    Stroke Mother    Hypertension Mother    Diabetes Mother    Heart failure Other    Breast cancer Maternal  Grandmother    Colon cancer Neg Hx       Home Medications:   Prior to Admission medications   Medication Sig Start Date End Date Taking? Authorizing Provider  acetaminophen (TYLENOL) 325 MG tablet Take 325 mg by mouth every 6 (six) hours as needed for mild pain or headache.    Yes [provider]  ALPRAZolam Duanne Moron) 1 MG tablet Take 1-2 mg by mouth at bedtime.   Yes [provider]  aspirin EC 81 MG tablet Take 81 mg by mouth every evening.    Yes [provider]  atorvastatin (LIPITOR) 80 MG tablet TAKE 1 TABLET BY MOUTH ONCE DAILY IN THE EVENING AT  6PM 07/21/20  Yes Larey Dresser, MD  carvedilol (COREG) 6.25 MG tablet Take 0.5 tablets (3.125 mg total) by mouth 2 (two) times daily. 03/20/20  Yes Larey Dresser, MD  Cholecalciferol (VITAMIN D3) 10 MCG (400 UNIT) CAPS Take 1 capsule by mouth daily.   Yes [provider]  clopidogrel (PLAVIX) 75 MG tablet TAKE 1 TABLET BY MOUTH ONCE DAILY PATIENT  NEEDS  APPOINTMENT 10/07/20  Yes Verta Ellen., NP  ezetimibe (ZETIA) 10 MG tablet Take 1 tablet by mouth once daily 07/07/20  Yes Larey Dresser, MD  FARXIGA 10 MG TABS tablet TAKE 1 TABLET BY MOUTH ONCE DAILY BEFORE BREAKFAST 09/30/20  Yes Larey Dresser, MD  furosemide (LASIX) 20 MG tablet Take 1 tablet (20 mg total) by mouth daily. 09/30/20 09/30/21 Yes Larey Dresser, MD  gabapentin (NEURONTIN) 100 MG capsule Take 200 mg by mouth at bedtime.   Yes [provider]  LORazepam (ATIVAN) 1  MG tablet Take 1 mg by mouth at bedtime. 11/09/17  Yes [provider]  Multiple Vitamins-Minerals (MULTIVITAMIN WITH MINERALS) tablet Take 1 tablet by mouth daily.   Yes [provider]  omeprazole (PRILOSEC) 20 MG capsule Take 20 mg by mouth daily.    Yes [provider]  rOPINIRole (REQUIP) 0.5 MG tablet Take 0.5 mg by mouth at bedtime.  11/13/17  Yes [provider]  sacubitril-valsartan (ENTRESTO) 97-103 MG Take 1 tablet by  mouth 2 (two) times daily. 08/17/20  Yes Clegg, Amy D, NP  spironolactone (ALDACTONE) 25 MG tablet Take 12.5 mg by mouth daily.   Yes [provider]  zinc gluconate 50 MG tablet Take 50 mg by mouth daily.   Yes [provider]  loperamide (IMODIUM) 2 MG capsule Take 2 mg by mouth 4 (four) times daily as needed for diarrhea or loose stools (ibs).     [provider]  nitroGLYCERIN (NITROSTAT) 0.4 MG SL tablet Place 1 tablet (0.4 mg total) under the tongue every 5 (five) minutes x 3 doses as needed for chest pain (if no relief after 2nd dose, proceed to the ED for an evaluation or call 911). Patient not taking: Reported on 10/19/2020 09/01/20   Verta Ellen., NP     Allergies:     Allergies  Allergen Reactions   Tape Other (See Comments)    PEELS SKIN OFF  (PAPER TAPE IS FINE)     Physical Exam:   Vitals  Blood pressure 110/90, pulse 80, temperature 98.3 F (36.8 C), temperature source Oral, resp. rate 15, height 5' (1.524 m), weight 52.6 kg, SpO2 99 %.  1.  General: Patient lying supine in bed,  no acute distress   2. Psychiatric: Alert and oriented x 3, mood and behavior normal for situation, pleasant and cooperative with exam   3. Neurologic: Speech and language are normal, face is symmetric, moves all 4 extremities voluntarily, 5/5 strength in all 4 extremities, but left upper and lower are weak compared to the right. Equal sensation BL.    4. HEENMT:  Head is atraumatic, normocephalic, pupils reactive to light, neck is supple, trachea is midline, mucous membranes are moist   5. Respiratory : Lungs are clear to auscultation bilaterally without wheezing, rhonchi, rales, no cyanosis, no increase in work of breathing or accessory muscle use   6. Cardiovascular : Heart rate normal, rhythm is regular, no murmurs, rubs or gallops, no peripheral edema, peripheral pulses palpated   7. Gastrointestinal:  Abdomen is soft, nondistended, nontender to  palpation bowel sounds active, no masses or organomegaly palpated   8. Skin:  Skin is warm, dry and intact without rashes, acute lesions, or ulcers on limited exam   9.Musculoskeletal:  No acute deformities, bruising on both upper extremities, no asymmetry in tone, no peripheral edema, peripheral pulses palpated, no tenderness to palpation in the extremities     Data Review:    CBC Recent Labs  Lab 10/19/20 1752  WBC 8.6  HGB 12.8  HCT 38.7  PLT 400  MCV 89.2  MCH 29.5  MCHC 33.1  RDW 15.5  LYMPHSABS 2.6  MONOABS 0.7  EOSABS 0.1  BASOSABS 0.1   ------------------------------------------------------------------------------------------------------------------  Results for orders placed or performed during the hospital encounter of 10/19/20 (from the past 48 hour(s))  Ethanol     Status: None   Collection Time: 10/19/20  5:52 PM  Result Value Ref Range   Alcohol, Ethyl (B) <10 <10 mg/dL  Comment: (NOTE) Lowest detectable limit for serum alcohol is 10 mg/dL.  For medical purposes only. Performed at [facility], [address], [place], [place] [address]   Protime-INR     Status: None   Collection Time: 10/19/20  5:52 PM  Result Value Ref Range   Prothrombin Time 12.6 11.4 - 15.2 seconds   INR 0.9 0.8 - 1.2    Comment: (NOTE) INR goal varies based on device and disease states. Performed at [facility], [address], [place], [place] [address]   APTT     Status: None   Collection Time: 10/19/20  5:52 PM  Result Value Ref Range   aPTT 30 24 - 36 seconds    Comment: Performed at [facility], [address] [place], [place] [address]  CBC     Status: None   Collection Time: 10/19/20  5:52 PM  Result Value Ref Range   WBC 8.6 4.0 - 10.5 K/uL   RBC 4.34 3.87 - 5.11 MIL/uL   Hemoglobin 12.8 12.0 - 15.0 g/dL   HCT 38.7 36.0 - 46.0 %   MCV 89.2 80.0 - 100.0 fL   MCH 29.5 26.0 - 34.0 pg   MCHC 33.1 30.0 - 36.0 g/dL   RDW 15.5 11.5 - 15.5 %   Platelets 400 150  - 400 K/uL   nRBC 0.0 0.0 - 0.2 %    Comment: Performed at [facility], [address], [place], [place] [address]  Differential     Status: None   Collection Time: 10/19/20  5:52 PM  Result Value Ref Range   Neutrophils Relative % 59 %   Neutro Abs 5.1 1.7 - 7.7 K/uL   Lymphocytes Relative 30 %   Lymphs Abs 2.6 0.7 - 4.0 K/uL   Monocytes Relative 8 %   Monocytes Absolute 0.7 0.1 - 1.0 K/uL   Eosinophils Relative 1 %   Eosinophils Absolute 0.1 0.0 - 0.5 K/uL   Basophils Relative 1 %   Basophils Absolute 0.1 0.0 - 0.1 K/uL   Immature Granulocytes 1 %   Abs Immature Granulocytes 0.04 0.00 - 0.07 K/uL    Comment: Performed at [facility], [address], [place], [place] [address]  Comprehensive metabolic panel     Status: Abnormal   Collection Time: 10/19/20  5:52 PM  Result Value Ref Range   Sodium 136 135 - 145 mmol/L   Potassium 4.0 3.5 - 5.1 mmol/L   Chloride 101 98 - 111 mmol/L   CO2 25 22 - 32 mmol/L   Glucose, Bld 92 70 - 99 mg/dL    Comment: Glucose reference range applies only to samples taken after fasting for at least 8 hours.   BUN 21 (H) 6 - 20 mg/dL   Creatinine, Ser 1.22 (H) 0.44 - 1.00 mg/dL   Calcium 9.1 8.9 - 10.3 mg/dL   Total Protein 7.2 6.5 - 8.1 g/dL   Albumin 4.2 3.5 - 5.0 g/dL   AST 27 15 - 41 U/L   ALT 22 0 - 44 U/L   Alkaline Phosphatase 71 38 - 126 U/L   Total Bilirubin 1.1 0.3 - 1.2 mg/dL   GFR, Estimated 51 (L) >60 mL/min    Comment: (NOTE) Calculated using the CKD-EPI Creatinine Equation (2021)    Anion gap 10 5 - 15    Comment: Performed at [facility], [address] [place], [place] [address]  Resp Panel by RT-PCR (Flu A&B, Covid) Nasopharyngeal Swab     Status:  None   Collection Time: 10/19/20  6:01 PM   Specimen: Nasopharyngeal Swab; Nasopharyngeal(NP) swabs in vial transport medium  Result Value Ref Range   SARS Coronavirus 2 by RT PCR NEGATIVE NEGATIVE    Comment: (NOTE) SARS-CoV-2 target nucleic acids are NOT DETECTED.  The  SARS-CoV-2 RNA is generally detectable in upper respiratory specimens during the acute phase of infection. The lowest concentration of SARS-CoV-2 viral copies this assay can detect is 138 copies/mL. A negative result does not preclude SARS-Cov-2 infection and should not be used as the sole basis for treatment or other patient management decisions. A negative result may occur with  improper specimen collection/handling, submission of specimen other than nasopharyngeal swab, presence of viral mutation(s) within the areas targeted by this assay, and inadequate number of viral copies(<138 copies/mL). A negative result must be combined with clinical observations, patient history, and epidemiological information. The expected result is Negative.  Fact Sheet for Patients:  EntrepreneurPulse.com.au  Fact Sheet for Healthcare Providers:  IncredibleEmployment.be  This test is no t yet approved or cleared by the [place] FDA and  has been authorized for detection and/or diagnosis of SARS-CoV-2 by FDA under an Emergency Use Authorization (EUA). This EUA will remain  in effect (meaning this test can be used) for the duration of the COVID-19 declaration under Section 564(b)(1) of the Act, 21 U.S.C.section 360bbb-3(b)(1), unless the authorization is terminated  or revoked sooner.       Influenza A by PCR NEGATIVE NEGATIVE   Influenza B by PCR NEGATIVE NEGATIVE    Comment: (NOTE) The Xpert Xpress SARS-CoV-2/FLU/RSV plus assay is intended as an aid in the diagnosis of influenza from Nasopharyngeal swab specimens and should not be used as a sole basis for treatment. Nasal washings and aspirates are unacceptable for Xpert Xpress SARS-CoV-2/FLU/RSV testing.  Fact Sheet for Patients: EntrepreneurPulse.com.au  Fact Sheet for Healthcare Providers: IncredibleEmployment.be  This test is not yet approved or cleared by the  [place] FDA and has been authorized for detection and/or diagnosis of SARS-CoV-2 by FDA under an Emergency Use Authorization (EUA). This EUA will remain in effect (meaning this test can be used) for the duration of the COVID-19 declaration under Section 564(b)(1) of the Act, 21 U.S.C. section 360bbb-3(b)(1), unless the authorization is terminated or revoked.  Performed at [facility], [address] [place], [place] [address]     Chemistries  Recent Labs  Lab 10/19/20 1752  NA 136  K 4.0  CL 101  CO2 25  GLUCOSE 92  BUN 21*  CREATININE 1.22*  CALCIUM 9.1  AST 27  ALT 22  ALKPHOS 71  BILITOT 1.1   ------------------------------------------------------------------------------------------------------------------  ------------------------------------------------------------------------------------------------------------------ GFR: Estimated Creatinine Clearance: 36.1 mL/min (A) (by C-G formula based on SCr of 1.22 mg/dL (H)). Liver Function Tests: Recent Labs  Lab 10/19/20 1752  AST 27  ALT 22  ALKPHOS 71  BILITOT 1.1  PROT 7.2  ALBUMIN 4.2   No results for input(s): LIPASE, AMYLASE in the last 168 hours. No results for input(s): AMMONIA in the last 168 hours. Coagulation Profile: Recent Labs  Lab 10/19/20 1752  INR 0.9   Cardiac Enzymes: No results for input(s): CKTOTAL, CKMB, CKMBINDEX, TROPONINI in the last 168 hours. BNP (last 3 results) No results for input(s): PROBNP in the last 8760 hours. HbA1C: No results for input(s): HGBA1C in the last 72 hours. CBG: No results for input(s): GLUCAP in the last 168 hours. Lipid Profile: No results for input(s): CHOL, HDL, LDLCALC, TRIG, CHOLHDL,  LDLDIRECT in the last 72 hours. Thyroid Function Tests: No results for input(s): TSH, T4TOTAL, FREET4, T3FREE, THYROIDAB in the last 72 hours. Anemia Panel: No results for input(s): VITAMINB12, FOLATE, FERRITIN, TIBC, IRON, RETICCTPCT in the last 72  hours.  --------------------------------------------------------------------------------------------------------------- Urine analysis:    Component Value Date/Time   COLORURINE YELLOW 01/07/2020 1906   APPEARANCEUR CLEAR 01/07/2020 1906   LABSPEC 1.017 01/07/2020 1906   PHURINE 5.0 01/07/2020 1906   GLUCOSEU >=500 (A) 01/07/2020 1906   HGBUR NEGATIVE 01/07/2020 1906   BILIRUBINUR NEGATIVE 01/07/2020 1906   KETONESUR NEGATIVE 01/07/2020 1906   PROTEINUR NEGATIVE 01/07/2020 1906   NITRITE NEGATIVE 01/07/2020 1906   LEUKOCYTESUR NEGATIVE 01/07/2020 1906      Imaging Results:    CT Angio Head W or Wo Contrast  Result Date: 10/19/2020 CLINICAL DATA:  Left-sided numbness and fall EXAM: CT ANGIOGRAPHY HEAD AND NECK TECHNIQUE: Multidetector CT imaging of the head and neck was performed using the standard protocol during bolus administration of intravenous contrast. Multiplanar CT image reconstructions and MIPs were obtained to evaluate the vascular anatomy. Carotid stenosis measurements (when applicable) are obtained utilizing NASCET criteria, using the distal internal carotid diameter as the denominator. CONTRAST:  47mL OMNIPAQUE IOHEXOL 350 MG/ML SOLN, 43mL OMNIPAQUE IOHEXOL 350 MG/ML SOLN, 3mL OMNIPAQUE IOHEXOL 350 MG/ML SOLN COMPARISON:  12/10/2019 FINDINGS: CT HEAD FINDINGS Brain: There is no mass, hemorrhage or extra-axial collection. The size and configuration of the ventricles and extra-axial CSF spaces are normal. There is no acute or chronic infarction. The brain parenchyma is normal. Skull: The visualized skull base, calvarium and extracranial soft tissues are normal. Sinuses/Orbits: No fluid levels or advanced mucosal thickening of the visualized paranasal sinuses. No mastoid or middle ear effusion. The orbits are normal. CTA NECK FINDINGS SKELETON: There is no bony spinal canal stenosis. No lytic or blastic lesion. OTHER NECK: Normal pharynx, larynx and major salivary glands. No  cervical lymphadenopathy. Unremarkable thyroid gland. UPPER CHEST: 7 mm ground-glass nodule in the left lung apex previously 6 mm (8:25). AORTIC ARCH: There is calcific atherosclerosis of the aortic arch. There is no aneurysm, dissection or hemodynamically significant stenosis of the visualized portion of the aorta. Normal variant aortic arch branching pattern with the brachiocephalic and left common carotid arteries sharing a common origin. The visualized proximal subclavian arteries are widely patent. RIGHT CAROTID SYSTEM: No dissection, occlusion or aneurysm. There is mixed density atherosclerosis extending into the proximal ICA, resulting in less than 50% stenosis. LEFT CAROTID SYSTEM: No dissection, occlusion or aneurysm. There is mixed density atherosclerosis extending into the proximal ICA, resulting in approximately 70% stenosis. VERTEBRAL ARTERIES: Left dominant configuration. Both origins are clearly patent. There is no dissection, occlusion or flow-limiting stenosis to the skull base (V1-V3 segments). CTA HEAD FINDINGS POSTERIOR CIRCULATION: --Vertebral arteries: Normal V4 segments. --Inferior cerebellar arteries: Normal. --Basilar artery: Normal. --Superior cerebellar arteries: Normal. --Posterior cerebral arteries (PCA): Normal. ANTERIOR CIRCULATION: --Intracranial internal carotid arteries: Left ICA terminus stent is patent. No aneurysm filling. --Anterior cerebral arteries (ACA): Normal. Both A1 segments are present. Patent anterior communicating artery (a-comm). --Middle cerebral arteries (MCA): Normal. VENOUS SINUSES: As permitted by contrast timing, patent. ANATOMIC VARIANTS: None Review of the MIP images confirms the above findings. IMPRESSION: 1. Patent left ICA terminus stent without aneurysm filling. 2. Approximately 70% stenosis of the proximal left internal carotid artery due to mixed density atherosclerosis. 3. Slight increase in size of left upper lobe ground-glass nodule, now measuring 7  mm, previously 6 mm. Initial follow-up with  CT at 6-12 months is recommended to confirm persistence. If persistent, repeat CT is recommended every 2 years until 5 years of stability has been established. This recommendation follows the consensus statement: Guidelines for Management of Incidental Pulmonary Nodules Detected on CT Images: From the Fleischner Society 2017; Radiology 2017; 284:228-243. Aortic Atherosclerosis (ICD10-I70.0). Electronically Signed   By: Ulyses Jarred M.D.   On: 10/19/2020 19:41   CT Angio Neck W and/or Wo Contrast  Result Date: 10/19/2020 CLINICAL DATA:  Left-sided numbness and fall EXAM: CT ANGIOGRAPHY HEAD AND NECK TECHNIQUE: Multidetector CT imaging of the head and neck was performed using the standard protocol during bolus administration of intravenous contrast. Multiplanar CT image reconstructions and MIPs were obtained to evaluate the vascular anatomy. Carotid stenosis measurements (when applicable) are obtained utilizing NASCET criteria, using the distal internal carotid diameter as the denominator. CONTRAST:  76mL OMNIPAQUE IOHEXOL 350 MG/ML SOLN, 9mL OMNIPAQUE IOHEXOL 350 MG/ML SOLN, 31mL OMNIPAQUE IOHEXOL 350 MG/ML SOLN COMPARISON:  12/10/2019 FINDINGS: CT HEAD FINDINGS Brain: There is no mass, hemorrhage or extra-axial collection. The size and configuration of the ventricles and extra-axial CSF spaces are normal. There is no acute or chronic infarction. The brain parenchyma is normal. Skull: The visualized skull base, calvarium and extracranial soft tissues are normal. Sinuses/Orbits: No fluid levels or advanced mucosal thickening of the visualized paranasal sinuses. No mastoid or middle ear effusion. The orbits are normal. CTA NECK FINDINGS SKELETON: There is no bony spinal canal stenosis. No lytic or blastic lesion. OTHER NECK: Normal pharynx, larynx and major salivary glands. No cervical lymphadenopathy. Unremarkable thyroid gland. UPPER CHEST: 7 mm ground-glass nodule in  the left lung apex previously 6 mm (8:25). AORTIC ARCH: There is calcific atherosclerosis of the aortic arch. There is no aneurysm, dissection or hemodynamically significant stenosis of the visualized portion of the aorta. Normal variant aortic arch branching pattern with the brachiocephalic and left common carotid arteries sharing a common origin. The visualized proximal subclavian arteries are widely patent. RIGHT CAROTID SYSTEM: No dissection, occlusion or aneurysm. There is mixed density atherosclerosis extending into the proximal ICA, resulting in less than 50% stenosis. LEFT CAROTID SYSTEM: No dissection, occlusion or aneurysm. There is mixed density atherosclerosis extending into the proximal ICA, resulting in approximately 70% stenosis. VERTEBRAL ARTERIES: Left dominant configuration. Both origins are clearly patent. There is no dissection, occlusion or flow-limiting stenosis to the skull base (V1-V3 segments). CTA HEAD FINDINGS POSTERIOR CIRCULATION: --Vertebral arteries: Normal V4 segments. --Inferior cerebellar arteries: Normal. --Basilar artery: Normal. --Superior cerebellar arteries: Normal. --Posterior cerebral arteries (PCA): Normal. ANTERIOR CIRCULATION: --Intracranial internal carotid arteries: Left ICA terminus stent is patent. No aneurysm filling. --Anterior cerebral arteries (ACA): Normal. Both A1 segments are present. Patent anterior communicating artery (a-comm). --Middle cerebral arteries (MCA): Normal. VENOUS SINUSES: As permitted by contrast timing, patent. ANATOMIC VARIANTS: None Review of the MIP images confirms the above findings. IMPRESSION: 1. Patent left ICA terminus stent without aneurysm filling. 2. Approximately 70% stenosis of the proximal left internal carotid artery due to mixed density atherosclerosis. 3. Slight increase in size of left upper lobe ground-glass nodule, now measuring 7 mm, previously 6 mm. Initial follow-up with CT at 6-12 months is recommended to confirm  persistence. If persistent, repeat CT is recommended every 2 years until 5 years of stability has been established. This recommendation follows the consensus statement: Guidelines for Management of Incidental Pulmonary Nodules Detected on CT Images: From the Fleischner Society 2017; Radiology 2017; 284:228-243. Aortic Atherosclerosis (ICD10-I70.0). Electronically Signed  By: Ulyses Jarred M.D.   On: 10/19/2020 19:41       Assessment & Plan:    Active Problems:   CVA (cerebral vascular accident) ([place])   CVA With symptoms for 3 days Weakness in left upper and lower extremity compared to right CTA head and neck without acute changes Will require MRI in the setting of pacemaker-admit to [facility] Past well so we will start heart healthy diet Continue aspirin Plavix and statin Continue to monitor Diabetes mellitus type 2 Sliding scale coverage GERD Continue Protonix CHF secondary to ischemic cardiomyopathy Continue statin, Coreg, Aldactone, Entresto Tick bite Prophylactic Doxy dose    DVT Prophylaxis-   Lovenox - SCDs   AM Labs Ordered, also please review Full Orders  Family Communication: No family at bedside Code Status: Full code  Admission status: Observation Time spent in minutes : [place] DO

## 2020-10-19 NOTE — ED Triage Notes (Signed)
Pt c/o hypotension and states left side went numb on Saturday causing pt to fall. Pt states today at work her left side went numb again, pt advised to come for eval.  Pt states that she has had problems with her left side going numb since having a MI in 2017.

## 2020-10-19 NOTE — ED Provider Notes (Signed)
[facility] Provider Note   CSN: [id number] Arrival date & time: 10/19/20  1733     History Chief Complaint  Patient presents with   Numbness    Erin [name] is a 60 y.o. female.  HPI  Patient presented to the ED with complaints of numbness on the left side of her body.  Patient states she started having symptoms on Saturday.  She felt numbness on the left side of her body associated with a feeling of excessive fatigue on the left side of her body.  Patient states it feels heavier on her left side including her arm and leg compared to her right.  Patient states it seemed to get better but today while she was at work it occurred again.  Patient denies any headache.  She does not have any fevers or chills.  She denies any shortness of breath or chest pain.  Patient states she does have a history of having trouble with numbness on the left side.  She does have history of prior strokes.  She also has history of  Past Medical History:  Diagnosis Date   Anxiety state 03/25/2016   Basal cell carcinoma of forehead    Brain aneurysm    CHF (congestive heart failure) ([place])    Coronary artery disease    a. 03/11/16 PCI with DES-->Prox/Mid Cx;  b. 03/14/16 PCI with DES x2-->RCA, EF 30-35%.   Essential hypertension    GERD (gastroesophageal reflux disease)    HFrEF (heart failure with reduced ejection fraction) ([place])    a. 10/2016 Echo: EF 35-40%, Gr1 DD, mild focal basal septal hypertrophy, basal inflat, mid inflat, basal antlat AK. Mid infept/inf/antlat, apical lateral sev HK. Mod MR. mildly reduced RV fxn. Mild TR.   History of pneumonia    Hyperlipidemia    IBS (irritable bowel syndrome)    Ischemic cardiomyopathy    a. 10/2016 Echo: EF 35-40%, Gr1 DD.   Mitral regurgitation    NSTEMI (non-ST elevated myocardial infarction) ([place]) 03/10/2016   Pneumonia 03/2016   Squamous cell cancer of skin of nose    Thrombocytosis 03/26/2016   Tobacco abuse    Trichimoniasis     Wears dentures    Wears glasses     Patient Active Problem List   Diagnosis Date Noted   Cough 02/11/2020   Near syncope 12/10/2019   Brain aneurysm 04/15/2019   Dysphagia 02/27/2018   Encounter for screening colonoscopy 02/27/2018   History of Clostridium difficile infection 02/27/2018   Frequent stools 12/20/2017   Chronic combined systolic and diastolic CHF (congestive heart failure) ([place]) 06/20/2016   Hypokalemia    Acute CHF (congestive heart failure) ([place]) 05/16/2016   Acute on chronic systolic CHF (congestive heart failure) ([place]) 05/16/2016   Acute respiratory failure with hypoxia ([facility])    Thrombocytosis 03/26/2016   Cardiomyopathy, ischemic 03/25/2016   Chronic combined systolic and diastolic heart failure ([place]) 03/25/2016   Anxiety state 03/25/2016   Troponin level elevated 03/25/2016   CAD (coronary artery disease), native coronary artery 03/25/2016   Normocytic anemia 03/25/2016   SOB (shortness of breath) 03/24/2016   Lightheadedness 03/17/2016   Hypotension 03/17/2016   Tobacco abuse 03/12/2016   NSTEMI (non-ST elevated myocardial infarction) ([place]) 03/11/2016   Atypical chest pain    Essential hypertension 09/06/2015   Mixed hyperlipidemia 09/06/2015   GERD (gastroesophageal reflux disease) 09/06/2015   Chest pain 09/06/2015    Past Surgical History:  Procedure Laterality Date   APPENDECTOMY  BIOPSY  09/20/2018   Procedure: BIOPSY;  Surgeon: Daneil Dolin, MD;  [facility];  Service: Endoscopy;;  colon   CARDIAC CATHETERIZATION N/A 03/11/2016   Procedure: Left Heart Cath and Coronary Angiography;  Surgeon: Leonie Man, MD;  Location: [facility];  Service: Cardiovascular;  Laterality: N/A;   CARDIAC CATHETERIZATION N/A 03/11/2016   Procedure: Coronary Stent Intervention;  Surgeon: Leonie Man, MD;  Location: [facility];  Service: Cardiovascular;  Laterality: N/A;   CARDIAC CATHETERIZATION N/A 03/14/2016   Procedure:  Coronary Stent Intervention;  Surgeon: Peter M Martinique, MD;  Location: [facility];  Service: Cardiovascular;  Laterality: N/A;   CHOLECYSTECTOMY OPEN  1984   COLONOSCOPY WITH PROPOFOL N/A 09/20/2018   Procedure: COLONOSCOPY WITH PROPOFOL;  Surgeon: Daneil Dolin, MD;  [facility];  Service: Endoscopy;  Laterality: N/A;  10:30am   CORONARY ANGIOPLASTY WITH STENT PLACEMENT  03/14/2016   ESOPHAGOGASTRODUODENOSCOPY (EGD) WITH PROPOFOL N/A 09/20/2018   Procedure: ESOPHAGOGASTRODUODENOSCOPY (EGD) WITH PROPOFOL;  Surgeon: Daneil Dolin, MD;  [facility];  Service: Endoscopy;  Laterality: N/A;   FINGER ARTHROPLASTY Left 05/14/2013   Procedure: LEFT THUMB CARPAL METACARPAL ARTHROPLASTY;  Surgeon: Tennis Must, MD;  Location: [facility];  Service: Orthopedics;  Laterality: Left;   ICD IMPLANT N/A 04/03/2020   Procedure: ICD IMPLANT;  Surgeon: Constance Haw, MD;  Location: [facility];  Service: Cardiovascular;  Laterality: N/A;   IR ANGIO INTRA EXTRACRAN SEL COM CAROTID INNOMINATE BILAT MOD SED  01/05/2017   IR ANGIO INTRA EXTRACRAN SEL COM CAROTID INNOMINATE BILAT MOD SED  03/19/2019   IR ANGIO INTRA EXTRACRAN SEL COM CAROTID INNOMINATE BILAT MOD SED  06/04/2020   IR ANGIO INTRA EXTRACRAN SEL INTERNAL CAROTID UNI L MOD SED  04/15/2019   IR ANGIO VERTEBRAL SEL VERTEBRAL BILAT MOD SED  01/05/2017   IR ANGIO VERTEBRAL SEL VERTEBRAL BILAT MOD SED  03/19/2019   IR ANGIO VERTEBRAL SEL VERTEBRAL UNI L MOD SED  06/04/2020   IR ANGIOGRAM FOLLOW UP STUDY  04/15/2019   IR RADIOLOGIST EVAL & MGMT  12/30/2016   IR TRANSCATH/EMBOLIZ  04/15/2019   IR US GUIDE VASC ACCESS RIGHT  03/19/2019   IR US GUIDE VASC ACCESS RIGHT  06/04/2020   MALONEY DILATION N/A 09/20/2018   Procedure: Venia Minks DILATION;  Surgeon: Daneil Dolin, MD;  [facility];  Service: Endoscopy;  Laterality: N/A;   RADIOLOGY WITH ANESTHESIA N/A 04/15/2019   Procedure: [employer];   Surgeon: Luanne Bras, MD;  Location: [facility];  Service: Radiology;  Laterality: N/A;   RIGHT/LEFT HEART CATH AND CORONARY ANGIOGRAPHY N/A 08/19/2019   Procedure: RIGHT/LEFT HEART CATH AND CORONARY ANGIOGRAPHY;  Surgeon: Larey Dresser, MD;  Location: [facility];  Service: Cardiovascular;  Laterality: N/A;   TUBAL LIGATION  1987   VAGINAL HYSTERECTOMY  2009     OB History     Gravida  4   Para  4   Term  4   Preterm      AB      Living  4      SAB      IAB      Ectopic      Multiple      Live Births              Family History  Problem Relation Age of Onset   Diabetes Father  Hypertension Father    CAD Father    Colon polyps Father 73       pre-cancerous    Stroke Father    Dementia Father    Stroke Mother    Hypertension Mother    Diabetes Mother    Heart failure Other    Breast cancer Maternal Grandmother    Colon cancer Neg Hx     Social History   Tobacco Use   Smoking status: Some Days    Packs/day: 0.10    Years: 15.00    Pack years: 1.50    Types: Cigarettes   Smokeless tobacco: Never   Tobacco comments:    smokes a cigarette occasionally  Vaping Use   Vaping Use: Never used  Substance Use Topics   Alcohol use: Yes    Comment: occasionally   Drug use: Not Currently    Types: Marijuana    Comment: former- 2017 last time    Home Medications Prior to Admission medications   Medication Sig Start Date End Date Taking? Authorizing Provider  acetaminophen (TYLENOL) 325 MG tablet Take 325 mg by mouth every 6 (six) hours as needed for mild pain or headache.    Yes [provider]  ALPRAZolam Duanne Moron) 1 MG tablet Take 1-2 mg by mouth at bedtime.   Yes [provider]  aspirin EC 81 MG tablet Take 81 mg by mouth every evening.    Yes [provider]  atorvastatin (LIPITOR) 80 MG tablet TAKE 1 TABLET BY MOUTH ONCE DAILY IN THE EVENING AT  6PM 07/21/20  Yes Larey Dresser, MD  carvedilol (COREG) 6.25  MG tablet Take 0.5 tablets (3.125 mg total) by mouth 2 (two) times daily. 03/20/20  Yes Larey Dresser, MD  Cholecalciferol (VITAMIN D3) 10 MCG (400 UNIT) CAPS Take 1 capsule by mouth daily.   Yes [provider]  clopidogrel (PLAVIX) 75 MG tablet TAKE 1 TABLET BY MOUTH ONCE DAILY PATIENT  NEEDS  APPOINTMENT 10/07/20  Yes Verta Ellen., NP  ezetimibe (ZETIA) 10 MG tablet Take 1 tablet by mouth once daily 07/07/20  Yes Larey Dresser, MD  FARXIGA 10 MG TABS tablet TAKE 1 TABLET BY MOUTH ONCE DAILY BEFORE BREAKFAST 09/30/20  Yes Larey Dresser, MD  furosemide (LASIX) 20 MG tablet Take 1 tablet (20 mg total) by mouth daily. 09/30/20 09/30/21 Yes Larey Dresser, MD  gabapentin (NEURONTIN) 100 MG capsule Take 200 mg by mouth at bedtime.   Yes [provider]  LORazepam (ATIVAN) 1 MG tablet Take 1 mg by mouth at bedtime. 11/09/17  Yes [provider]  Multiple Vitamins-Minerals (MULTIVITAMIN WITH MINERALS) tablet Take 1 tablet by mouth daily.   Yes [provider]  omeprazole (PRILOSEC) 20 MG capsule Take 20 mg by mouth daily.    Yes [provider]  rOPINIRole (REQUIP) 0.5 MG tablet Take 0.5 mg by mouth at bedtime.  11/13/17  Yes [provider]  sacubitril-valsartan (ENTRESTO) 97-103 MG Take 1 tablet by mouth 2 (two) times daily. 08/17/20  Yes Clegg, Amy D, NP  spironolactone (ALDACTONE) 25 MG tablet Take 12.5 mg by mouth daily.   Yes [provider]  zinc gluconate 50 MG tablet Take 50 mg by mouth daily.   Yes [provider]  loperamide (IMODIUM) 2 MG capsule Take 2 mg by mouth 4 (four) times daily as needed for diarrhea or loose stools (ibs).     [provider]  nitroGLYCERIN (NITROSTAT) 0.4 MG  SL tablet Place 1 tablet (0.4 mg total) under the tongue every 5 (five) minutes x 3 doses as needed for chest pain (if no relief after 2nd dose, proceed to the ED for an evaluation or call 911). Patient not taking: Reported on  10/19/2020 09/01/20   Verta Ellen., NP    Allergies    Tape  Review of Systems   Review of Systems  All other systems reviewed and are negative.  Physical Exam Updated Vital Signs BP 101/80   Pulse 85   Temp 98.3 F (36.8 C) (Oral)   Resp 19   Ht 1.524 m (5')   Wt 52.6 kg   SpO2 100%   BMI 22.65 kg/m   Physical Exam Vitals and nursing note reviewed.  Constitutional:      General: She is not in acute distress.    Appearance: She is well-developed.  HENT:     Head: Normocephalic and atraumatic.     Right Ear: External ear normal.     Left Ear: External ear normal.  Eyes:     General: No scleral icterus.       Right eye: No discharge.        Left eye: No discharge.     Conjunctiva/sclera: Conjunctivae normal.  Neck:     Trachea: No tracheal deviation.  Cardiovascular:     Rate and Rhythm: Normal rate and regular rhythm.  Pulmonary:     Effort: Pulmonary effort is normal. No respiratory distress.     Breath sounds: Normal breath sounds. No stridor. No wheezing or rales.  Abdominal:     General: Bowel sounds are normal. There is no distension.     Palpations: Abdomen is soft.     Tenderness: There is no abdominal tenderness. There is no guarding or rebound.  Musculoskeletal:        General: No tenderness or deformity.     Cervical back: Neck supple.  Skin:    General: Skin is warm and dry.     Findings: No rash.  Neurological:     General: No focal deficit present.     Mental Status: She is alert and oriented to person, place, and time.     Cranial Nerves: No cranial nerve deficit (no facial droop, extraocular movements intact, no slurred speech).     Sensory: No sensory deficit.     Motor: No abnormal muscle tone or seizure activity.     Coordination: Coordination normal.     Comments:  pronator drift left  upper extrem, able to hold both legs off bed for 5 seconds? Slight drift left leg, sensation intact in all extremities, no visual field cuts, no left  or right sided neglect, normal finger-nose exam bilaterally, no nystagmus noted   Psychiatric:        Mood and Affect: Mood normal.    ED Results / Procedures / Treatments   Labs (all labs ordered are listed, but only abnormal results are displayed) Labs Reviewed  COMPREHENSIVE METABOLIC PANEL - Abnormal; Notable for the following components:      Result Value   BUN 21 (*)    Creatinine, Ser 1.22 (*)    GFR, Estimated 51 (*)    All other components within normal limits  RESP PANEL BY RT-PCR (FLU A&B, COVID) ARPGX2  ETHANOL  PROTIME-INR  APTT  CBC  DIFFERENTIAL  RAPID URINE DRUG SCREEN, HOSP PERFORMED  URINALYSIS, ROUTINE W REFLEX MICROSCOPIC    EKG EKG Interpretation  Date/Time:  Monday October 19 2020 18:11:28 EDT Ventricular Rate:  79 PR Interval:  130 QRS Duration: 91 QT Interval:  392 QTC Calculation: 450 R Axis:   41 Text Interpretation: Sinus rhythm Low voltage, extremity leads No significant change since last tracing Confirmed by Dorie Rank [id number]) on 10/19/2020 6:18:57 PM  Radiology CT Angio Head W or Wo Contrast  Result Date: 10/19/2020 CLINICAL DATA:  Left-sided numbness and fall EXAM: CT ANGIOGRAPHY HEAD AND NECK TECHNIQUE: Multidetector CT imaging of the head and neck was performed using the standard protocol during bolus administration of intravenous contrast. Multiplanar CT image reconstructions and MIPs were obtained to evaluate the vascular anatomy. Carotid stenosis measurements (when applicable) are obtained utilizing NASCET criteria, using the distal internal carotid diameter as the denominator. CONTRAST:  80mL OMNIPAQUE IOHEXOL 350 MG/ML SOLN, 38mL OMNIPAQUE IOHEXOL 350 MG/ML SOLN, 41mL OMNIPAQUE IOHEXOL 350 MG/ML SOLN COMPARISON:  12/10/2019 FINDINGS: CT HEAD FINDINGS Brain: There is no mass, hemorrhage or extra-axial collection. The size and configuration of the ventricles and extra-axial CSF spaces are normal. There is no acute or chronic infarction. The  brain parenchyma is normal. Skull: The visualized skull base, calvarium and extracranial soft tissues are normal. Sinuses/Orbits: No fluid levels or advanced mucosal thickening of the visualized paranasal sinuses. No mastoid or middle ear effusion. The orbits are normal. CTA NECK FINDINGS SKELETON: There is no bony spinal canal stenosis. No lytic or blastic lesion. OTHER NECK: Normal pharynx, larynx and major salivary glands. No cervical lymphadenopathy. Unremarkable thyroid gland. UPPER CHEST: 7 mm ground-glass nodule in the left lung apex previously 6 mm (8:25). AORTIC ARCH: There is calcific atherosclerosis of the aortic arch. There is no aneurysm, dissection or hemodynamically significant stenosis of the visualized portion of the aorta. Normal variant aortic arch branching pattern with the brachiocephalic and left common carotid arteries sharing a common origin. The visualized proximal subclavian arteries are widely patent. RIGHT CAROTID SYSTEM: No dissection, occlusion or aneurysm. There is mixed density atherosclerosis extending into the proximal ICA, resulting in less than 50% stenosis. LEFT CAROTID SYSTEM: No dissection, occlusion or aneurysm. There is mixed density atherosclerosis extending into the proximal ICA, resulting in approximately 70% stenosis. VERTEBRAL ARTERIES: Left dominant configuration. Both origins are clearly patent. There is no dissection, occlusion or flow-limiting stenosis to the skull base (V1-V3 segments). CTA HEAD FINDINGS POSTERIOR CIRCULATION: --Vertebral arteries: Normal V4 segments. --Inferior cerebellar arteries: Normal. --Basilar artery: Normal. --Superior cerebellar arteries: Normal. --Posterior cerebral arteries (PCA): Normal. ANTERIOR CIRCULATION: --Intracranial internal carotid arteries: Left ICA terminus stent is patent. No aneurysm filling. --Anterior cerebral arteries (ACA): Normal. Both A1 segments are present. Patent anterior communicating artery (a-comm). --Middle  cerebral arteries (MCA): Normal. VENOUS SINUSES: As permitted by contrast timing, patent. ANATOMIC VARIANTS: None Review of the MIP images confirms the above findings. IMPRESSION: 1. Patent left ICA terminus stent without aneurysm filling. 2. Approximately 70% stenosis of the proximal left internal carotid artery due to mixed density atherosclerosis. 3. Slight increase in size of left upper lobe ground-glass nodule, now measuring 7 mm, previously 6 mm. Initial follow-up with CT at 6-12 months is recommended to confirm persistence. If persistent, repeat CT is recommended every 2 years until 5 years of stability has been established. This recommendation follows the consensus statement: Guidelines for Management of Incidental Pulmonary Nodules Detected on CT Images: From the Fleischner Society 2017; Radiology 2017; 284:228-243. Aortic Atherosclerosis (ICD10-I70.0). Electronically Signed   By: Ulyses Jarred M.D.   On: 10/19/2020 19:41   CT Angio Neck W  and/or Wo Contrast  Result Date: 10/19/2020 CLINICAL DATA:  Left-sided numbness and fall EXAM: CT ANGIOGRAPHY HEAD AND NECK TECHNIQUE: Multidetector CT imaging of the head and neck was performed using the standard protocol during bolus administration of intravenous contrast. Multiplanar CT image reconstructions and MIPs were obtained to evaluate the vascular anatomy. Carotid stenosis measurements (when applicable) are obtained utilizing NASCET criteria, using the distal internal carotid diameter as the denominator. CONTRAST:  76mL OMNIPAQUE IOHEXOL 350 MG/ML SOLN, 9mL OMNIPAQUE IOHEXOL 350 MG/ML SOLN, 46mL OMNIPAQUE IOHEXOL 350 MG/ML SOLN COMPARISON:  12/10/2019 FINDINGS: CT HEAD FINDINGS Brain: There is no mass, hemorrhage or extra-axial collection. The size and configuration of the ventricles and extra-axial CSF spaces are normal. There is no acute or chronic infarction. The brain parenchyma is normal. Skull: The visualized skull base, calvarium and extracranial  soft tissues are normal. Sinuses/Orbits: No fluid levels or advanced mucosal thickening of the visualized paranasal sinuses. No mastoid or middle ear effusion. The orbits are normal. CTA NECK FINDINGS SKELETON: There is no bony spinal canal stenosis. No lytic or blastic lesion. OTHER NECK: Normal pharynx, larynx and major salivary glands. No cervical lymphadenopathy. Unremarkable thyroid gland. UPPER CHEST: 7 mm ground-glass nodule in the left lung apex previously 6 mm (8:25). AORTIC ARCH: There is calcific atherosclerosis of the aortic arch. There is no aneurysm, dissection or hemodynamically significant stenosis of the visualized portion of the aorta. Normal variant aortic arch branching pattern with the brachiocephalic and left common carotid arteries sharing a common origin. The visualized proximal subclavian arteries are widely patent. RIGHT CAROTID SYSTEM: No dissection, occlusion or aneurysm. There is mixed density atherosclerosis extending into the proximal ICA, resulting in less than 50% stenosis. LEFT CAROTID SYSTEM: No dissection, occlusion or aneurysm. There is mixed density atherosclerosis extending into the proximal ICA, resulting in approximately 70% stenosis. VERTEBRAL ARTERIES: Left dominant configuration. Both origins are clearly patent. There is no dissection, occlusion or flow-limiting stenosis to the skull base (V1-V3 segments). CTA HEAD FINDINGS POSTERIOR CIRCULATION: --Vertebral arteries: Normal V4 segments. --Inferior cerebellar arteries: Normal. --Basilar artery: Normal. --Superior cerebellar arteries: Normal. --Posterior cerebral arteries (PCA): Normal. ANTERIOR CIRCULATION: --Intracranial internal carotid arteries: Left ICA terminus stent is patent. No aneurysm filling. --Anterior cerebral arteries (ACA): Normal. Both A1 segments are present. Patent anterior communicating artery (a-comm). --Middle cerebral arteries (MCA): Normal. VENOUS SINUSES: As permitted by contrast timing, patent.  ANATOMIC VARIANTS: None Review of the MIP images confirms the above findings. IMPRESSION: 1. Patent left ICA terminus stent without aneurysm filling. 2. Approximately 70% stenosis of the proximal left internal carotid artery due to mixed density atherosclerosis. 3. Slight increase in size of left upper lobe ground-glass nodule, now measuring 7 mm, previously 6 mm. Initial follow-up with CT at 6-12 months is recommended to confirm persistence. If persistent, repeat CT is recommended every 2 years until 5 years of stability has been established. This recommendation follows the consensus statement: Guidelines for Management of Incidental Pulmonary Nodules Detected on CT Images: From the Fleischner Society 2017; Radiology 2017; 284:228-243. Aortic Atherosclerosis (ICD10-I70.0). Electronically Signed   By: Ulyses Jarred M.D.   On: 10/19/2020 19:41    Procedures Procedures   Medications Ordered in ED Medications  sodium chloride 0.9 % bolus 500 mL (0 mLs Intravenous Stopped 10/19/20 1856)    Followed by  0.9 %  sodium chloride infusion (100 mL/hr Intravenous New Bag/Given 10/19/20 1856)  iohexol (OMNIPAQUE) 350 MG/ML injection 75 mL (has no administration in time range)  iohexol (OMNIPAQUE) 350 MG/ML  injection 25 mL (25 mLs Intravenous Contrast Given 10/19/20 1918)  iohexol (OMNIPAQUE) 350 MG/ML injection 25 mL (25 mLs Intravenous Contrast Given 10/19/20 1917)  iohexol (OMNIPAQUE) 350 MG/ML injection 25 mL (25 mLs Intravenous Contrast Given 10/19/20 1917)    ED Course  I have reviewed the triage vital signs and the nursing notes.  Pertinent labs & imaging results that were available during my care of the patient were reviewed by me and considered in my medical decision making (see chart for details).    MDM Rules/Calculators/A&P               NIH Stroke Scale: 2          Patient presented to the ED for evaluation of left-sided numbness and weakness.  Patient does have history of prior cerebral  stenting.  On exam patient does have slight drift noted on her left arm and left leg.  Symptoms have been ongoing since Saturday so she is out any acute treatment window.  Head and neck CT angiogram did not show any signs of acute abnormality.  I am concerned about the possibility of an occult stroke.  Patient does have an AICD in place but it does appear that its MRI compatible.  With her symptoms I think it is reasonable to bring her in to the hospital overnight for neurology consultation and MRI tomorrow when it is available.  I will consult the medical service. Final Clinical Impression(s) / ED Diagnoses Final diagnoses:  Stroke-like symptom  Carotid stenosis, left  Pulmonary nodule     Dorie Rank, MD 10/19/20 2037

## 2020-10-20 ENCOUNTER — Inpatient Hospital Stay (HOSPITAL_COMMUNITY): Payer: 59

## 2020-10-20 ENCOUNTER — Other Ambulatory Visit (HOSPITAL_COMMUNITY): Payer: 59

## 2020-10-20 DIAGNOSIS — I6522 Occlusion and stenosis of left carotid artery: Secondary | ICD-10-CM | POA: Diagnosis present

## 2020-10-20 DIAGNOSIS — W57XXXA Bitten or stung by nonvenomous insect and other nonvenomous arthropods, initial encounter: Secondary | ICD-10-CM | POA: Diagnosis present

## 2020-10-20 DIAGNOSIS — Z833 Family history of diabetes mellitus: Secondary | ICD-10-CM | POA: Diagnosis not present

## 2020-10-20 DIAGNOSIS — R2990 Unspecified symptoms and signs involving the nervous system: Secondary | ICD-10-CM

## 2020-10-20 DIAGNOSIS — R202 Paresthesia of skin: Secondary | ICD-10-CM | POA: Diagnosis present

## 2020-10-20 DIAGNOSIS — Z8701 Personal history of pneumonia (recurrent): Secondary | ICD-10-CM | POA: Diagnosis not present

## 2020-10-20 DIAGNOSIS — Z8249 Family history of ischemic heart disease and other diseases of the circulatory system: Secondary | ICD-10-CM | POA: Diagnosis not present

## 2020-10-20 DIAGNOSIS — I639 Cerebral infarction, unspecified: Secondary | ICD-10-CM

## 2020-10-20 DIAGNOSIS — R531 Weakness: Secondary | ICD-10-CM | POA: Diagnosis present

## 2020-10-20 DIAGNOSIS — I255 Ischemic cardiomyopathy: Secondary | ICD-10-CM | POA: Diagnosis present

## 2020-10-20 DIAGNOSIS — R911 Solitary pulmonary nodule: Secondary | ICD-10-CM | POA: Diagnosis present

## 2020-10-20 DIAGNOSIS — I110 Hypertensive heart disease with heart failure: Secondary | ICD-10-CM | POA: Diagnosis present

## 2020-10-20 DIAGNOSIS — R200 Anesthesia of skin: Secondary | ICD-10-CM | POA: Diagnosis present

## 2020-10-20 DIAGNOSIS — E119 Type 2 diabetes mellitus without complications: Secondary | ICD-10-CM | POA: Diagnosis present

## 2020-10-20 DIAGNOSIS — I2510 Atherosclerotic heart disease of native coronary artery without angina pectoris: Secondary | ICD-10-CM | POA: Diagnosis present

## 2020-10-20 DIAGNOSIS — K219 Gastro-esophageal reflux disease without esophagitis: Secondary | ICD-10-CM | POA: Diagnosis present

## 2020-10-20 DIAGNOSIS — E785 Hyperlipidemia, unspecified: Secondary | ICD-10-CM | POA: Diagnosis present

## 2020-10-20 DIAGNOSIS — F411 Generalized anxiety disorder: Secondary | ICD-10-CM | POA: Diagnosis present

## 2020-10-20 DIAGNOSIS — Z85828 Personal history of other malignant neoplasm of skin: Secondary | ICD-10-CM | POA: Diagnosis not present

## 2020-10-20 DIAGNOSIS — Z803 Family history of malignant neoplasm of breast: Secondary | ICD-10-CM | POA: Diagnosis not present

## 2020-10-20 DIAGNOSIS — I5022 Chronic systolic (congestive) heart failure: Secondary | ICD-10-CM | POA: Diagnosis not present

## 2020-10-20 DIAGNOSIS — Z8371 Family history of colonic polyps: Secondary | ICD-10-CM | POA: Diagnosis not present

## 2020-10-20 DIAGNOSIS — F17210 Nicotine dependence, cigarettes, uncomplicated: Secondary | ICD-10-CM | POA: Diagnosis present

## 2020-10-20 DIAGNOSIS — Z823 Family history of stroke: Secondary | ICD-10-CM | POA: Diagnosis not present

## 2020-10-20 DIAGNOSIS — I252 Old myocardial infarction: Secondary | ICD-10-CM | POA: Diagnosis not present

## 2020-10-20 DIAGNOSIS — Z20822 Contact with and (suspected) exposure to covid-19: Secondary | ICD-10-CM | POA: Diagnosis present

## 2020-10-20 DIAGNOSIS — I5042 Chronic combined systolic (congestive) and diastolic (congestive) heart failure: Secondary | ICD-10-CM | POA: Diagnosis present

## 2020-10-20 DIAGNOSIS — Z8673 Personal history of transient ischemic attack (TIA), and cerebral infarction without residual deficits: Secondary | ICD-10-CM | POA: Diagnosis not present

## 2020-10-20 LAB — GLUCOSE, CAPILLARY
Glucose-Capillary: 97 mg/dL (ref 70–99)
Glucose-Capillary: 113 mg/dL — ABNORMAL HIGH (ref 70–99)
Glucose-Capillary: 117 mg/dL — ABNORMAL HIGH (ref 70–99)

## 2020-10-20 LAB — LIPID PANEL
Cholesterol: 99 mg/dL (ref 0–200)
HDL: 36 mg/dL — ABNORMAL LOW (ref >40)
LDL Cholesterol: 7 mg/dL (ref 0–99)
Total CHOL/HDL Ratio: 2.8 RATIO
Triglycerides: 278 mg/dL — ABNORMAL HIGH (ref <150)
VLDL: 56 mg/dL — ABNORMAL HIGH (ref 0–40)

## 2020-10-20 LAB — CREATININE, SERUM
Creatinine, Ser: 0.92 mg/dL (ref 0.44–1.00)
GFR, Estimated: >60 mL/min (ref >60)

## 2020-10-20 MED ORDER — SODIUM CHLORIDE 0.9 % IV SOLN: INTRAVENOUS | Status: DC

## 2020-10-20 MED ORDER — ENOXAPARIN SODIUM 40 MG/0.4ML IJ SOSY
40.0000 mg | PREFILLED_SYRINGE | INTRAMUSCULAR | Status: DC
  Administered 2020-10-20: 40 mg via SUBCUTANEOUS
  Filled 2020-10-20 (×2): qty 0.4

## 2020-10-20 MED ORDER — FUROSEMIDE 20 MG PO TABS: 20.0000 mg | ORAL_TABLET | Freq: Every day | ORAL | Status: DC

## 2020-10-20 NOTE — Evaluation (Signed)
Occupational Therapy Evaluation Patient Details Name: Erin [name] MRN: [id number] DOB: [birth year] Today's Date: 10/20/2020    History of Present Illness Erin [name] is an 60 y.o. female admitted 10/19/20 with left-sided numbness and weakness (started 10/17/20) which caused a fall. CT negative for acute abnormality, MRI pending. PMH includes: anxiety, brain aneurysm status post repair, chronic heart failure, coronary disease, with ongoing tobacco abuse of a pack a day   Clinical Impression   Pt is typically independent in ADL/Mobility. 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 Today she presents with L sided numbness, deficits in coordination and strength. She was able to demonstrate transfers at min guard assist level (for initial balance) as well as sink level grooming at min guard level, set up for LB dressing demonstrating figure 4 for BLE. Pt will benefit from skilled OT in the acute setting as well as afterwards at the OPOT level with focus on LUE and fine motor coordination deficits. Next session print and educate on L fine motor coordination handouts as well as theraputty handouts. Pt will also benefit from education on decreased sensation safety.     Follow Up Recommendations  Outpatient OT;Supervision/Assistance - 24 hour (initially)    Equipment Recommendations  Tub/shower seat    Recommendations for Other Services       Precautions / Restrictions Precautions Precautions: Fall Precaution Comments: L sided numbness Restrictions Weight Bearing Restrictions: No      Mobility Bed Mobility Overal bed mobility: Modified Independent                  Transfers Overall transfer level: Needs assistance Equipment used: None Transfers: Sit to/from Stand Sit to Stand: Min guard;Min assist         General transfer comment: mn guard assist for initial balance standing from bed    Balance Overall balance assessment: Needs assistance Sitting-balance  support: No upper extremity supported;Feet unsupported (legs are too short to reach sitting EOB) Sitting balance-Leahy Scale: Good     Standing balance support: No upper extremity supported;Single extremity supported;During functional activity Standing balance-Leahy Scale: Fair                             ADL either performed or assessed with clinical judgement   ADL Overall ADL's : Needs assistance/impaired Eating/Feeding: Set up   Grooming: Wash/dry face;Oral care;Min guard;Standing Grooming Details (indicate cue type and reason): sink level, able to use BUE together, squeeze toothpaste, did not have to open containers this session Upper Body Bathing: Set up;Sitting   Lower Body Bathing: Set up;Sitting/lateral leans   Upper Body Dressing : Supervision/safety;Sitting Upper Body Dressing Details (indicate cue type and reason): to don extra gown Lower Body Dressing: Supervision/safety;Sitting/lateral leans Lower Body Dressing Details (indicate cue type and reason): able to perform figure 4 with both legs Toilet Transfer: Min guard;Minimal assistance;Ambulation Toilet Transfer Details (indicate cue type and reason): no DME         Functional mobility during ADLs: Min guard;Minimal assistance (no DME)       Vision Baseline Vision/History: Wears glasses Wears Glasses: At all times Patient Visual Report: No change from baseline       Perception     Praxis      Pertinent Vitals/Pain Pain Assessment: Faces Pain Score: 0-No pain Pain Intervention(s): Monitored during session;Repositioned     Hand Dominance Right   Extremity/Trunk Assessment Upper Extremity Assessment Upper Extremity Assessment: Generalized  weakness;LUE deficits/detail LUE Deficits / Details: L>R weakness, overall a 3+/5 LUE Sensation: decreased light touch LUE Coordination: decreased fine motor;decreased gross motor   Lower Extremity Assessment Lower Extremity Assessment: Defer to PT  evaluation       Communication Communication Communication: No difficulties   Cognition Arousal/Alertness: Awake/alert Behavior During Therapy: WFL for tasks assessed/performed Overall Cognitive Status: No family/caregiver present to determine baseline cognitive functioning Area of Impairment: Safety/judgement                         Safety/Judgement: Decreased awareness of safety     General Comments: Pt was eager to participate, but moved very quickly for transitions and needed cues to wait for safety   General Comments  limited evaluation as Pt is going down to MRI    Exercises     Shoulder Instructions      Home Living Family/patient expects to be discharged to:: Private residence Living Arrangements: Spouse/significant other;Children Available Help at Discharge: Available 24 hours/day Type of Home: House Home Access: Stairs to enter [employer] of Steps: 3 Entrance Stairs-Rails: None Home Layout: Two level;Able to live on main level with bedroom/bathroom     Bathroom Shower/Tub: Teacher, early years/pre: Standard Bathroom Accessibility: Yes   Home Equipment: None          Prior Functioning/Environment Level of Independence: Independent        Comments: Hydrographic surveyor, drives, works as a Quarry manager at [employer] List: Decreased strength;Impaired balance (sitting and/or standing);Decreased coordination;Decreased safety awareness;Impaired sensation;Impaired UE functional use      OT Treatment/Interventions: Self-care/ADL training;Therapeutic exercise;Neuromuscular education;DME and/or AE instruction;Therapeutic activities;Cognitive remediation/compensation;Patient/family education;Balance training    OT Goals(Current goals can be found in the care plan section) Acute Rehab OT Goals Patient Stated Goal: get L side back to normal OT Goal Formulation: With patient Time For Goal Achievement:  11/03/20 Potential to Achieve Goals: Good ADL Goals Pt Will Perform Grooming: with modified independence;standing Pt Will Transfer to Toilet: with modified independence;ambulating Pt Will Perform Toileting - Clothing Manipulation and hygiene: with modified independence;sit to/from stand Pt/caregiver will Perform Home Exercise Program: Increased strength;Left upper extremity;With written HEP provided  OT Frequency: Min 2X/week   Barriers to D/C:            Co-evaluation              AM-PAC OT "6 Clicks" Daily Activity     Outcome Measure Help from another person eating meals?: A Little Help from another person taking care of personal grooming?: A Little Help from another person toileting, which includes using toliet, bedpan, or urinal?: A Little Help from another person bathing (including washing, rinsing, drying)?: A Little Help from another person to put on and taking off regular upper body clothing?: None Help from another person to put on and taking off regular lower body clothing?: A Little 6 Click Score: 19   End of Session Equipment Utilized During Treatment: Gait belt Nurse Communication: Mobility status  Activity Tolerance: Patient tolerated treatment well Patient left: Other (comment) (in transport chair going down to MRI with RN and transport staff)  OT Visit Diagnosis: Unsteadiness on feet (R26.81);Other abnormalities of gait and mobility (R26.89);Muscle weakness (generalized) (M62.81);Other symptoms and signs involving cognitive function;Other symptoms and signs involving the nervous system (R29.898);Hemiplegia and hemiparesis Hemiplegia - Right/Left: Left Hemiplegia - dominant/non-dominant: Non-Dominant Hemiplegia - caused by: Unspecified  Time: 1040-1052 OT Time Calculation (min): 12 min Charges:  OT General Charges $OT Visit: 1 Visit OT Evaluation $OT Eval Moderate Complexity: [place] OTR/L Acute Rehabilitation Services Pager:  [phone] Office: [place] 10/20/2020, 11:52 AM

## 2020-10-20 NOTE — Plan of Care (Signed)
The patient is transferred from AP to 3 W. room 30. A & O x 4. Denied any acute pain at this time. Full assessment to epic completed. Patient voiced no concern. Will continue to monitor.

## 2020-10-20 NOTE — Progress Notes (Signed)
Pt to MRI for scan.  Changed device settings for MRI tper orders to   OVO   Tachy-therapies to off  Will program device back to pre-MRI settings after completion of exam, and send transmission.

## 2020-10-20 NOTE — Consult Note (Signed)
Referring Physician: Dr. Aileen Fass    Chief Complaint: 11 day history of left sided weakness  HPI: Erin [name] is an 60 y.o. female with a PMHx including brain aneurysm (left ICA terminus), CHF, CAD, HTN, HFrEF, ischemic cardiomyopathy, HLD and ICD placement (MRI compatible) who presented to the hospital for evaluation of left sided weakness. Her symptoms started 11 days ago on Saturday when her LLE gave out. She also has had LUE weakness, dropping objects from her left hand intermittently. She has new symptoms of tingling on the sole of her left foot and fingers of her left hand. No facial droop or speech deficit. Has had "tunnel vision" of her left eye with obscuration of peripheral vision mostly in the left hemifield of that eye. She denies having had any other symptoms.   CT head shows normal appearing brain parenchyma.   CTA of head and neck shows patent left ICA terminus stent without aneurysm filling. There is approximately 70% stenosis of the proximal left internal carotid artery due to mixed density atherosclerosis.   At home, she is on Plavix and ASA given prior left ICA stent placement. She also has a history of MI with 2 coronary artery stents placed in the past. She takes high-dose atorvastatin.   LSN: 11 days ago Saturday tPA Given: No: Out of the time window  Past Medical History:  Diagnosis Date   Anxiety state 03/25/2016   Basal cell carcinoma of forehead    Brain aneurysm    CHF (congestive heart failure) [place])    Coronary artery disease    a. 03/11/16 PCI with DES-->Prox/Mid Cx;  b. 03/14/16 PCI with DES x2-->RCA, EF 30-35%.   Essential hypertension    GERD (gastroesophageal reflux disease)    HFrEF (heart failure with reduced ejection fraction) ([place])    a. 10/2016 Echo: EF 35-40%, Gr1 DD, mild focal basal septal hypertrophy, basal inflat, mid inflat, basal antlat AK. Mid infept/inf/antlat, apical lateral sev HK. Mod MR. mildly reduced RV fxn. Mild TR.   History of  pneumonia    Hyperlipidemia    IBS (irritable bowel syndrome)    Ischemic cardiomyopathy    a. 10/2016 Echo: EF 35-40%, Gr1 DD.   Mitral regurgitation    NSTEMI (non-ST elevated myocardial infarction) ([place]) 03/10/2016   Pneumonia 03/2016   Squamous cell cancer of skin of nose    Thrombocytosis 03/26/2016   Tobacco abuse    Trichimoniasis    Wears dentures    Wears glasses     Past Surgical History:  Procedure Laterality Date   APPENDECTOMY     BIOPSY  09/20/2018   Procedure: BIOPSY;  Surgeon: Daneil Dolin, MD;  [facility];  Service: Endoscopy;;  colon   CARDIAC CATHETERIZATION N/A 03/11/2016   Procedure: Left Heart Cath and Coronary Angiography;  Surgeon: Leonie Man, MD;  Location: [facility];  Service: Cardiovascular;  Laterality: N/A;   CARDIAC CATHETERIZATION N/A 03/11/2016   Procedure: Coronary Stent Intervention;  Surgeon: Leonie Man, MD;  Location: [place] [facility];  Service: Cardiovascular;  Laterality: N/A;   CARDIAC CATHETERIZATION N/A 03/14/2016   Procedure: Coronary Stent Intervention;  Surgeon: Peter M Martinique, MD;  Location: [facility];  Service: Cardiovascular;  Laterality: N/A;   CHOLECYSTECTOMY OPEN  1984   COLONOSCOPY WITH PROPOFOL N/A 09/20/2018   Procedure: COLONOSCOPY WITH PROPOFOL;  Surgeon: Daneil Dolin, MD;  [facility];  Service: Endoscopy;  Laterality: N/A;  10:30am  CORONARY ANGIOPLASTY WITH STENT PLACEMENT  03/14/2016   ESOPHAGOGASTRODUODENOSCOPY (EGD) WITH PROPOFOL N/A 09/20/2018   Procedure: ESOPHAGOGASTRODUODENOSCOPY (EGD) WITH PROPOFOL;  Surgeon: Daneil Dolin, MD;  [facility];  Service: Endoscopy;  Laterality: N/A;   FINGER ARTHROPLASTY Left 05/14/2013   Procedure: LEFT THUMB CARPAL METACARPAL ARTHROPLASTY;  Surgeon: Tennis Must, MD;  Location: [facility];  Service: Orthopedics;  Laterality: Left;   ICD IMPLANT N/A 04/03/2020   Procedure: ICD IMPLANT;  Surgeon:  Constance Haw, MD;  Location: [place];  Service: Cardiovascular;  Laterality: N/A;   IR ANGIO INTRA EXTRACRAN SEL COM CAROTID INNOMINATE BILAT MOD SED  01/05/2017   IR ANGIO INTRA EXTRACRAN SEL COM CAROTID INNOMINATE BILAT MOD SED  03/19/2019   IR ANGIO INTRA EXTRACRAN SEL COM CAROTID INNOMINATE BILAT MOD SED  06/04/2020   IR ANGIO INTRA EXTRACRAN SEL INTERNAL CAROTID UNI L MOD SED  04/15/2019   IR ANGIO VERTEBRAL SEL VERTEBRAL BILAT MOD SED  01/05/2017   IR ANGIO VERTEBRAL SEL VERTEBRAL BILAT MOD SED  03/19/2019   IR ANGIO VERTEBRAL SEL VERTEBRAL UNI L MOD SED  06/04/2020   IR ANGIOGRAM FOLLOW UP STUDY  04/15/2019   IR RADIOLOGIST EVAL & MGMT  12/30/2016   IR TRANSCATH/EMBOLIZ  04/15/2019   IR US GUIDE VASC ACCESS RIGHT  03/19/2019   IR US GUIDE VASC ACCESS RIGHT  06/04/2020   MALONEY DILATION N/A 09/20/2018   Procedure: Venia Minks DILATION;  Surgeon: Daneil Dolin, MD;  [facility];  Service: Endoscopy;  Laterality: N/A;   RADIOLOGY WITH ANESTHESIA N/A 04/15/2019   Procedure: [employer];  Surgeon: Luanne Bras, MD;  Location: [place] [facility];  Service: Radiology;  Laterality: N/A;   RIGHT/LEFT HEART CATH AND CORONARY ANGIOGRAPHY N/A 08/19/2019   Procedure: RIGHT/LEFT HEART CATH AND CORONARY ANGIOGRAPHY;  Surgeon: Larey Dresser, MD;  Location: [facility];  Service: Cardiovascular;  Laterality: N/A;   TUBAL LIGATION  1987   VAGINAL HYSTERECTOMY  2009    Family History  Problem Relation Age of Onset   Diabetes Father    Hypertension Father    CAD Father    Colon polyps Father 89       pre-cancerous    Stroke Father    Dementia Father    Stroke Mother    Hypertension Mother    Diabetes Mother    Heart failure Other    Breast cancer Maternal Grandmother    Colon cancer Neg Hx    Social History:  reports that she has been smoking cigarettes. She has a 1.50 pack-year smoking history. She has never used smokeless tobacco. She reports current alcohol use. She  reports previous drug use. Drug: Marijuana.  Allergies:  Allergies  Allergen Reactions   Tape Other (See Comments)    PEELS SKIN OFF  (PAPER TAPE IS FINE)    Medications: Prior to Admission:  Medications Prior to Admission  Medication Sig Dispense Refill Last Dose   acetaminophen (TYLENOL) 325 MG tablet Take 325 mg by mouth every 6 (six) hours as needed for mild pain or headache.       ALPRAZolam (XANAX) 1 MG tablet Take 1-2 mg by mouth at bedtime.   10/18/2020   aspirin EC 81 MG tablet Take 81 mg by mouth every evening.    10/18/2020 at 2100   atorvastatin (LIPITOR) 80 MG tablet TAKE 1 TABLET BY MOUTH ONCE DAILY IN THE EVENING AT  6PM 90 tablet 0 10/18/2020  carvedilol (COREG) 6.25 MG tablet Take 0.5 tablets (3.125 mg total) by mouth 2 (two) times daily. 30 tablet 6 10/19/2020 at 0800   Cholecalciferol (VITAMIN D3) 10 MCG (400 UNIT) CAPS Take 1 capsule by mouth daily.   10/19/2020   clopidogrel (PLAVIX) 75 MG tablet TAKE 1 TABLET BY MOUTH ONCE DAILY PATIENT  NEEDS  APPOINTMENT 3 tablet 0 10/18/2020 at 1200   ezetimibe (ZETIA) 10 MG tablet Take 1 tablet by mouth once daily 30 tablet 6 10/19/2020   FARXIGA 10 MG TABS tablet TAKE 1 TABLET BY MOUTH ONCE DAILY BEFORE BREAKFAST 30 tablet 0 10/19/2020   furosemide (LASIX) 20 MG tablet Take 1 tablet (20 mg total) by mouth daily. 90 tablet 3 10/19/2020   gabapentin (NEURONTIN) 100 MG capsule Take 200 mg by mouth at bedtime.   10/18/2020   LORazepam (ATIVAN) 1 MG tablet Take 1 mg by mouth at bedtime.  1 10/18/2020   Multiple Vitamins-Minerals (MULTIVITAMIN WITH MINERALS) tablet Take 1 tablet by mouth daily.   10/19/2020   omeprazole (PRILOSEC) 20 MG capsule Take 20 mg by mouth daily.    10/18/2020   rOPINIRole (REQUIP) 0.5 MG tablet Take 0.5 mg by mouth at bedtime.   0 10/19/2020   sacubitril-valsartan (ENTRESTO) 97-103 MG Take 1 tablet by mouth 2 (two) times daily. 180 tablet 3 10/19/2020 at 0900   spironolactone (ALDACTONE) 25 MG tablet Take 12.5 mg by mouth  daily.   10/19/2020   zinc gluconate 50 MG tablet Take 50 mg by mouth daily.   10/19/2020   loperamide (IMODIUM) 2 MG capsule Take 2 mg by mouth 4 (four) times daily as needed for diarrhea or loose stools (ibs).       nitroGLYCERIN (NITROSTAT) 0.4 MG SL tablet Place 1 tablet (0.4 mg total) under the tongue every 5 (five) minutes x 3 doses as needed for chest pain (if no relief after 2nd dose, proceed to the ED for an evaluation or call 911). (Patient not taking: Reported on 10/19/2020) 25 tablet 2 Not Taking   Scheduled:   stroke: mapping our early stages of recovery book   Does not apply Once   aspirin EC  81 mg Oral QPM   atorvastatin  80 mg Oral Daily   carvedilol  3.125 mg Oral BID   clopidogrel  75 mg Oral Daily   enoxaparin (LOVENOX) injection  40 mg Subcutaneous Q24H   ezetimibe  10 mg Oral Daily   [START ON 10/21/2020] furosemide  20 mg Oral Daily   gabapentin  200 mg Oral QHS   insulin aspart  0-15 Units Subcutaneous TID WC   insulin aspart  0-5 Units Subcutaneous QHS   LORazepam  1 mg Oral QHS   pantoprazole  40 mg Oral Daily   rOPINIRole  0.5 mg Oral QHS   spironolactone  12.5 mg Oral Daily   Continuous:  sodium chloride 100 mL/hr (10/20/20 0342)    ROS: As per HPI.   Physical Examination: Blood pressure 95/66, pulse 77, temperature 98.2 F (36.8 C), temperature source Oral, resp. rate 18, height 5' (1.524 m), weight 52.6 kg, SpO2 99 %.  HEENT: [place]/AT Lungs: Respirations unlabored Ext: No edema  Neurologic Examination: Mental Status: Alert, oriented x 5, thought content appropriate.  Speech fluent without evidence of aphasia.  Able to follow all commands without difficulty.No dysarthria. Cranial Nerves: II:  Visual fields intact all 4 quadrants of each eye bilaterally. No extinction to DSS.  III,IV, VI: No ptosis. EOMI.   V,VII:  Smile symmetric, facial temp sensation decreased on the left.  VIII: hearing intact to voice IX,X: No hypophonia XI: Slight lag on the left  with shoulder shrug XII: Midline tongue extension  Motor: RUE and RLE 5/5 LUE 4/5 proximally and distally LLE 4/5  Mildly decreased left sided finger dexterity Sensory: Decreased temp sensation to LUE.  Deep Tendon Reflexes:  Right brachioradialis 2+ Left brachioradialis 3+ Right patellar 4+ (crossed adductor) Left patellar 3+ Toes downgoing bilaterally Cerebellar: No ataxia with FNF on the right. Slow FNF on the left without ataxia.  Gait: Deferred   Results for orders placed or performed during the hospital encounter of 10/19/20 (from the past 48 hour(s))  Ethanol     Status: None   Collection Time: 10/19/20  5:52 PM  Result Value Ref Range   Alcohol, Ethyl (B) <10 <10 mg/dL    Comment: (NOTE) Lowest detectable limit for serum alcohol is 10 mg/dL.  For medical purposes only. Performed at [facility], [address], [place], [place] [address]   Protime-INR     Status: None   Collection Time: 10/19/20  5:52 PM  Result Value Ref Range   Prothrombin Time 12.6 11.4 - 15.2 seconds   INR 0.9 0.8 - 1.2    Comment: (NOTE) INR goal varies based on device and disease states. Performed at [facility], [address] [place], [place] [address]   APTT     Status: None   Collection Time: 10/19/20  5:52 PM  Result Value Ref Range   aPTT 30 24 - 36 seconds    Comment: Performed at [facility], [address] [place], [place] [address]  CBC     Status: None   Collection Time: 10/19/20  5:52 PM  Result Value Ref Range   WBC 8.6 4.0 - 10.5 K/uL   RBC 4.34 3.87 - 5.11 MIL/uL   Hemoglobin 12.8 12.0 - 15.0 g/dL   HCT 38.7 36.0 - 46.0 %   MCV 89.2 80.0 - 100.0 fL   MCH 29.5 26.0 - 34.0 pg   MCHC 33.1 30.0 - 36.0 g/dL   RDW 15.5 11.5 - 15.5 %   Platelets 400 150 - 400 K/uL   nRBC 0.0 0.0 - 0.2 %    Comment: Performed at [facility], [address], [place], [place] [address]  Differential     Status: None   Collection Time: 10/19/20  5:52 PM  Result Value Ref Range    Neutrophils Relative % 59 %   Neutro Abs 5.1 1.7 - 7.7 K/uL   Lymphocytes Relative 30 %   Lymphs Abs 2.6 0.7 - 4.0 K/uL   Monocytes Relative 8 %   Monocytes Absolute 0.7 0.1 - 1.0 K/uL   Eosinophils Relative 1 %   Eosinophils Absolute 0.1 0.0 - 0.5 K/uL   Basophils Relative 1 %   Basophils Absolute 0.1 0.0 - 0.1 K/uL   Immature Granulocytes 1 %   Abs Immature Granulocytes 0.04 0.00 - 0.07 K/uL    Comment: Performed at [facility], [address] [place],  [address]  Comprehensive metabolic panel     Status: Abnormal   Collection Time: 10/19/20  5:52 PM  Result Value Ref Range   Sodium 136 135 - 145 mmol/L   Potassium 4.0 3.5 - 5.1 mmol/L   Chloride 101 98 - 111 mmol/L   CO2 25 22 - 32 mmol/L   Glucose, Bld 92 70 - 99 mg/dL    Comment: Glucose reference range  applies only to samples taken after fasting for at least 8 hours.   BUN 21 (H) 6 - 20 mg/dL   Creatinine, Ser 1.22 (H) 0.44 - 1.00 mg/dL   Calcium 9.1 8.9 - 10.3 mg/dL   Total Protein 7.2 6.5 - 8.1 g/dL   Albumin 4.2 3.5 - 5.0 g/dL   AST 27 15 - 41 U/L   ALT 22 0 - 44 U/L   Alkaline Phosphatase 71 38 - 126 U/L   Total Bilirubin 1.1 0.3 - 1.2 mg/dL   GFR, Estimated 51 (L) >60 mL/min    Comment: (NOTE) Calculated using the CKD-EPI Creatinine Equation (2021)    Anion gap 10 5 - 15    Comment: Performed at [facility], [address] [place], [place] [address]  Resp Panel by RT-PCR (Flu A&B, Covid) Nasopharyngeal Swab     Status: None   Collection Time: 10/19/20  6:01 PM   Specimen: Nasopharyngeal Swab; Nasopharyngeal(NP) swabs in vial transport medium  Result Value Ref Range   SARS Coronavirus 2 by RT PCR NEGATIVE NEGATIVE    Comment: (NOTE) SARS-CoV-2 target nucleic acids are NOT DETECTED.  The SARS-CoV-2 RNA is generally detectable in upper respiratory specimens during the acute phase of infection. The lowest concentration of SARS-CoV-2 viral copies this assay can detect is 138 copies/mL. A negative result  does not preclude SARS-Cov-2 infection and should not be used as the sole basis for treatment or other patient management decisions. A negative result may occur with  improper specimen collection/handling, submission of specimen other than nasopharyngeal swab, presence of viral mutation(s) within the areas targeted by this assay, and inadequate number of viral copies(<138 copies/mL). A negative result must be combined with clinical observations, patient history, and epidemiological information. The expected result is Negative.  Fact Sheet for Patients:  EntrepreneurPulse.com.au  Fact Sheet for Healthcare Providers:  IncredibleEmployment.be  This test is no t yet approved or cleared by the [place] FDA and  has been authorized for detection and/or diagnosis of SARS-CoV-2 by FDA under an Emergency Use Authorization (EUA). This EUA will remain  in effect (meaning this test can be used) for the duration of the COVID-19 declaration under Section 564(b)(1) of the Act, 21 U.S.C.section 360bbb-3(b)(1), unless the authorization is terminated  or revoked sooner.       Influenza A by PCR NEGATIVE NEGATIVE   Influenza B by PCR NEGATIVE NEGATIVE    Comment: (NOTE) The Xpert Xpress SARS-CoV-2/FLU/RSV plus assay is intended as an aid in the diagnosis of influenza from Nasopharyngeal swab specimens and should not be used as a sole basis for treatment. Nasal washings and aspirates are unacceptable for Xpert Xpress SARS-CoV-2/FLU/RSV testing.  Fact Sheet for Patients: EntrepreneurPulse.com.au  Fact Sheet for Healthcare Providers: IncredibleEmployment.be  This test is not yet approved or cleared by the [place] FDA and has been authorized for detection and/or diagnosis of SARS-CoV-2 by FDA under an Emergency Use Authorization (EUA). This EUA will remain in effect (meaning this test can be used) for the duration  of the COVID-19 declaration under Section 564(b)(1) of the Act, 21 U.S.C. section 360bbb-3(b)(1), unless the authorization is terminated or revoked.  Performed at [facility], [address], [place], [place] [address]   CBG monitoring, ED     Status: None   Collection Time: 10/19/20 10:46 PM  Result Value Ref Range   Glucose-Capillary 77 70 - 99 mg/dL    Comment: Glucose reference range applies only to samples taken after fasting for  at least 8 hours.  Lipid panel     Status: Abnormal   Collection Time: 10/20/20  5:08 AM  Result Value Ref Range   Cholesterol 99 0 - 200 mg/dL   Triglycerides 278 (H) <150 mg/dL   HDL 36 (L) >40 mg/dL   Total CHOL/HDL Ratio 2.8 RATIO   VLDL 56 (H) 0 - 40 mg/dL   LDL Cholesterol 7 0 - 99 mg/dL    Comment:        Total Cholesterol/HDL:CHD Risk Coronary Heart Disease Risk Table                     Men   Women  1/2 Average Risk   3.4   3.3  Average Risk       5.0   4.4  2 X Average Risk   9.6   7.1  3 X Average Risk  23.4   11.0        Use the calculated Patient Ratio above and the CHD Risk Table to determine the patient's CHD Risk.        ATP III CLASSIFICATION (LDL):  <100     mg/dL   Optimal  100-129  mg/dL   Near or Above                    Optimal  130-159  mg/dL   Borderline  160-189  mg/dL   High  >190     mg/dL   Very High Performed at [facility] [address], [place], [place] [address]   Creatinine, serum     Status: None   Collection Time: 10/20/20  5:08 AM  Result Value Ref Range   Creatinine, Ser 0.92 0.44 - 1.00 mg/dL   GFR, Estimated >60 >60 mL/min    Comment: (NOTE) Calculated using the CKD-EPI Creatinine Equation (2021) Performed at [facility] [address], [place],  [address]    CT Angio Head W or Wo Contrast  Result Date: 10/19/2020 CLINICAL DATA:  Left-sided numbness and fall EXAM: CT ANGIOGRAPHY HEAD AND NECK TECHNIQUE: Multidetector CT imaging of the head and neck was performed using  the standard protocol during bolus administration of intravenous contrast. Multiplanar CT image reconstructions and MIPs were obtained to evaluate the vascular anatomy. Carotid stenosis measurements (when applicable) are obtained utilizing NASCET criteria, using the distal internal carotid diameter as the denominator. CONTRAST:  9mL OMNIPAQUE IOHEXOL 350 MG/ML SOLN, 33mL OMNIPAQUE IOHEXOL 350 MG/ML SOLN, 61mL OMNIPAQUE IOHEXOL 350 MG/ML SOLN COMPARISON:  12/10/2019 FINDINGS: CT HEAD FINDINGS Brain: There is no mass, hemorrhage or extra-axial collection. The size and configuration of the ventricles and extra-axial CSF spaces are normal. There is no acute or chronic infarction. The brain parenchyma is normal. Skull: The visualized skull base, calvarium and extracranial soft tissues are normal. Sinuses/Orbits: No fluid levels or advanced mucosal thickening of the visualized paranasal sinuses. No mastoid or middle ear effusion. The orbits are normal. CTA NECK FINDINGS SKELETON: There is no bony spinal canal stenosis. No lytic or blastic lesion. OTHER NECK: Normal pharynx, larynx and major salivary glands. No cervical lymphadenopathy. Unremarkable thyroid gland. UPPER CHEST: 7 mm ground-glass nodule in the left lung apex previously 6 mm (8:25). AORTIC ARCH: There is calcific atherosclerosis of the aortic arch. There is no aneurysm, dissection or hemodynamically significant stenosis of the visualized portion of the aorta. Normal variant aortic arch branching pattern with the brachiocephalic and left common carotid arteries sharing  a common origin. The visualized proximal subclavian arteries are widely patent. RIGHT CAROTID SYSTEM: No dissection, occlusion or aneurysm. There is mixed density atherosclerosis extending into the proximal ICA, resulting in less than 50% stenosis. LEFT CAROTID SYSTEM: No dissection, occlusion or aneurysm. There is mixed density atherosclerosis extending into the proximal ICA, resulting in  approximately 70% stenosis. VERTEBRAL ARTERIES: Left dominant configuration. Both origins are clearly patent. There is no dissection, occlusion or flow-limiting stenosis to the skull base (V1-V3 segments). CTA HEAD FINDINGS POSTERIOR CIRCULATION: --Vertebral arteries: Normal V4 segments. --Inferior cerebellar arteries: Normal. --Basilar artery: Normal. --Superior cerebellar arteries: Normal. --Posterior cerebral arteries (PCA): Normal. ANTERIOR CIRCULATION: --Intracranial internal carotid arteries: Left ICA terminus stent is patent. No aneurysm filling. --Anterior cerebral arteries (ACA): Normal. Both A1 segments are present. Patent anterior communicating artery (a-comm). --Middle cerebral arteries (MCA): Normal. VENOUS SINUSES: As permitted by contrast timing, patent. ANATOMIC VARIANTS: None Review of the MIP images confirms the above findings. IMPRESSION: 1. Patent left ICA terminus stent without aneurysm filling. 2. Approximately 70% stenosis of the proximal left internal carotid artery due to mixed density atherosclerosis. 3. Slight increase in size of left upper lobe ground-glass nodule, now measuring 7 mm, previously 6 mm. Initial follow-up with CT at 6-12 months is recommended to confirm persistence. If persistent, repeat CT is recommended every 2 years until 5 years of stability has been established. This recommendation follows the consensus statement: Guidelines for Management of Incidental Pulmonary Nodules Detected on CT Images: From the Fleischner Society 2017; Radiology 2017; 284:228-243. Aortic Atherosclerosis (ICD10-I70.0). Electronically Signed   By: Ulyses Jarred M.D.   On: 10/19/2020 19:41   CT Angio Neck W and/or Wo Contrast  Result Date: 10/19/2020 CLINICAL DATA:  Left-sided numbness and fall EXAM: CT ANGIOGRAPHY HEAD AND NECK TECHNIQUE: Multidetector CT imaging of the head and neck was performed using the standard protocol during bolus administration of intravenous contrast. Multiplanar CT  image reconstructions and MIPs were obtained to evaluate the vascular anatomy. Carotid stenosis measurements (when applicable) are obtained utilizing NASCET criteria, using the distal internal carotid diameter as the denominator. CONTRAST:  58mL OMNIPAQUE IOHEXOL 350 MG/ML SOLN, 27mL OMNIPAQUE IOHEXOL 350 MG/ML SOLN, 49mL OMNIPAQUE IOHEXOL 350 MG/ML SOLN COMPARISON:  12/10/2019 FINDINGS: CT HEAD FINDINGS Brain: There is no mass, hemorrhage or extra-axial collection. The size and configuration of the ventricles and extra-axial CSF spaces are normal. There is no acute or chronic infarction. The brain parenchyma is normal. Skull: The visualized skull base, calvarium and extracranial soft tissues are normal. Sinuses/Orbits: No fluid levels or advanced mucosal thickening of the visualized paranasal sinuses. No mastoid or middle ear effusion. The orbits are normal. CTA NECK FINDINGS SKELETON: There is no bony spinal canal stenosis. No lytic or blastic lesion. OTHER NECK: Normal pharynx, larynx and major salivary glands. No cervical lymphadenopathy. Unremarkable thyroid gland. UPPER CHEST: 7 mm ground-glass nodule in the left lung apex previously 6 mm (8:25). AORTIC ARCH: There is calcific atherosclerosis of the aortic arch. There is no aneurysm, dissection or hemodynamically significant stenosis of the visualized portion of the aorta. Normal variant aortic arch branching pattern with the brachiocephalic and left common carotid arteries sharing a common origin. The visualized proximal subclavian arteries are widely patent. RIGHT CAROTID SYSTEM: No dissection, occlusion or aneurysm. There is mixed density atherosclerosis extending into the proximal ICA, resulting in less than 50% stenosis. LEFT CAROTID SYSTEM: No dissection, occlusion or aneurysm. There is mixed density atherosclerosis extending into the proximal ICA, resulting in approximately 70% stenosis.  VERTEBRAL ARTERIES: Left dominant configuration. Both origins are  clearly patent. There is no dissection, occlusion or flow-limiting stenosis to the skull base (V1-V3 segments). CTA HEAD FINDINGS POSTERIOR CIRCULATION: --Vertebral arteries: Normal V4 segments. --Inferior cerebellar arteries: Normal. --Basilar artery: Normal. --Superior cerebellar arteries: Normal. --Posterior cerebral arteries (PCA): Normal. ANTERIOR CIRCULATION: --Intracranial internal carotid arteries: Left ICA terminus stent is patent. No aneurysm filling. --Anterior cerebral arteries (ACA): Normal. Both A1 segments are present. Patent anterior communicating artery (a-comm). --Middle cerebral arteries (MCA): Normal. VENOUS SINUSES: As permitted by contrast timing, patent. ANATOMIC VARIANTS: None Review of the MIP images confirms the above findings. IMPRESSION: 1. Patent left ICA terminus stent without aneurysm filling. 2. Approximately 70% stenosis of the proximal left internal carotid artery due to mixed density atherosclerosis. 3. Slight increase in size of left upper lobe ground-glass nodule, now measuring 7 mm, previously 6 mm. Initial follow-up with CT at 6-12 months is recommended to confirm persistence. If persistent, repeat CT is recommended every 2 years until 5 years of stability has been established. This recommendation follows the consensus statement: Guidelines for Management of Incidental Pulmonary Nodules Detected on CT Images: From the Fleischner Society 2017; Radiology 2017; 284:228-243. Aortic Atherosclerosis (ICD10-I70.0). Electronically Signed   By: Ulyses Jarred M.D.   On: 10/19/2020 19:41    Assessment: 60 y.o. female with a PMHx including brain aneurysm (left ICA terminus), CHF, CAD, HTN, HFrEF, ischemic cardiomyopathy, HLD and ICD placement (MRI compatible) who presented to the hospital for evaluation of left sided weakness. Her symptoms started 11 days ago on Saturday when her LLE gave out. She also has had LUE weakness, dropping objects from her left hand intermittently. She has new  symptoms of tingling on the sole of her left foot and fingers of her left hand. No facial droop or speech deficit. Has had "tunnel vision" of her left eye with obscuration of peripheral vision mostly in the left hemifield of that eye. She denies having had any other symptoms.  1. Exam reveals left sided motor findings best referable to the right corona radiata, internal capsule, basal ganglia, thalamus or pons. Most likely has a lacunar stroke at one of those locations that is not detectable on CT 2. CTA of head and neck: Patent left ICA terminus stent without aneurysm filling. Approximately 70% stenosis of the proximal left internal carotid artery due to mixed density atherosclerosis. (Non-neurological finding of LUL nodule also seen on CTA with recommendations for follow up imaging per Radiology report) 3. CT head: There is no mass, hemorrhage or extra-axial collection. The size and configuration of the ventricles and extra-axial CSF spaces are normal. There is no acute or chronic infarction. The brain parenchyma is normal. 4. Stroke Risk Factors - As listed above, in addition to abnormal lipid panel  Recommendations: 1. MRI of the brain without contrast 2. TTE 3. PT consult, OT consult, Speech consult 4. Cardiac telemetry 5. Continue ASA, Plavix and atorvastatin  6. Cardiac telemetry 7. Risk factor modification 8. Frequent neuro checks 9. BP management. Out of the permissive HTN time window   @Electronically  signed: Dr. Kerney Elbe  10/20/2020, 8:25 AM

## 2020-10-20 NOTE — Progress Notes (Signed)
EPIC Encounter for ICM Monitoring  Patient Name: Erin [name] is a 60 y.o. female Date: 10/20/2020 Primary Care Physican: [name], MD Primary Cardiologist: Aundra Dubin Electrophysiologist: Curt Bears 09/28/2020 Weight: 116 lbs  10/06/2020 Weight: 119 lbs                                                            Transmission reviewed.  Patient currently in hospital being evaluated for stroke.   Optivol thoracic impedance suggesting fluid levels returned to normal.   Prescribed: Furosemide 20 mg take 1 tablet daily (increased 09/30/20).   Spironolactone 25 mg take 0.5 tablet daily.   Labs: 10/19/2020 Creatinine 1.22, BUN 21, Potassium 4.0, Sodium 136 10/07/2020 Creatinine 1.03, BUN 18, Potassium 4.4, Sodium 142, GFR 63  09/05/2020 Creatinine 1.37, BUN 23, Potassium 3.8, Sodium 135, GFR 45 08/06/2020 Creatinine 0.89, BUN 15, Potassium 3.9, Sodium 137, GFR >60 07/02/2020 Creatinine 0.94, BUN 16, Potassium 4.3, Sodium 137, GFR >60  A complete set of results can be found in Results Review.   Recommendations: None   Follow-up plan: ICM clinic phone appointment on 11/05/2020.   91 day device clinic remote transmission 01/04/2021.     EP/Cardiology Office Visits:  12/10/2020 with Dr Aundra Dubin   Copy of ICM check sent to Dr. Curt Bears.    3 month ICM trend: 10/19/2020.    1 Year ICM trend:       Rosalene Billings, RN 10/20/2020 11:08 AM

## 2020-10-20 NOTE — Progress Notes (Signed)
[facility] PROGRESS NOTE    Progress Note  DENEISE GETTY  DVV:[id number] DOB: November 20, 1960 DOA: 10/19/2020 PCP: [name], MD     Brief Narrative:   Erin [name] is an 60 y.o. female past medical history of anxiety, brain aneurysm status post repair, chronic heart failure, coronary disease, with ongoing tobacco abuse of a pack a day, 3 glasses of wine per week comes into the ED with chief complaint of left-sided numbness and weakness, the patient is employed at the [employer].  She would like that it started around June 11 with some left-sided numbness which eventually caused her to fall at that she did not hit her head or lose consciousness.   Assessment/Plan:   CVA (cerebral vascular accident) [facility]): Her symptoms started 3 days prior to her visit to the ED she is not a candidate for tPA. CT of the head showed no masses or hemorrhage, no acute infarcts. CTA showed 70% stenosis of the proximal left internal carotid artery due to mixed density arthrosclerosis, with a patent left terminus stent. MRI is pending. HgbA1c, fasting lipid panel  PT, OT, Speech consult pending Transthoracic Echo, done on 03/20/2020 showed an EF of 30%, the left ventricular was moderately dilatedwith grade 2 diastolic dysfunction Start patient on ASA 81mg  daily and plavix 75mg  daily  Atorvastatin  BP goal: permissive HTN upto 220/120 mmHg Telemetry monitoring  Diabetes mellitus type 2: A1c is pending, continue sliding scale insulin.  Last A1c 12/10/2019 was 5.9.  GERD: Continue Protonix  Ischemic cardiomyopathy/chronic systolic heart failure: With a recently done 2D echo that showed an EF of 62% grade 2 diastolic dysfunction valves were grossly normal. Continue statin Coreg and Aldactone. In the meantime we will hold Entresto and Lasix to allow a degree of permissive hypertension, she also received IV contrast. Restart them slowly in 24 hours.  Left upper lobe groundglass nodule: Seen on CT  measuring 7 mm previously 6 mm will need a 6 to 12 months follow-up every 2 years until 5 years of stability.   DVT prophylaxis: lovenox Family Communication:none at bedside Status is: Observation  The patient will require care spanning > 2 midnights and should be moved to inpatient because: Hemodynamically unstable  Dispo: The patient is from: Home              Anticipated d/c is to: Home              Patient currently is not medically stable to d/c.   Difficult to place patient No  Code Status:     Code Status Orders  (From admission, onward)           Start     Ordered   10/19/20 2138  Full code  Continuous        10/19/20 2138           Code Status History     Date Active Date Inactive Code Status Order ID Comments User Context   04/03/2020 1614 04/04/2020 0025 Full Code [id number]  Constance Haw, MD Inpatient   12/10/2019 1420 12/11/2019 2205 Full Code [id number]  Orson Eva, MD ED   08/19/2019 0854 08/19/2019 1815 Full Code [id number]  Larey Dresser, MD Inpatient   03/04/2018 1118 03/05/2018 2025 Full Code [id number]  Heath Lark D, DO Inpatient   07/31/2017 2214 07/31/2017 2214 Full Code [id number]  Reubin Milan, MD ED   07/31/2017 2214 07/31/2017 2214 Full Code [id number]  Reubin Milan, MD ED  03/19/2017 0456 03/19/2017 1821 Full Code [id number]  Tacey Ruiz, MD ED   11/19/2016 1559 11/21/2016 2002 Full Code [id number]  Kathie Dike, MD Inpatient   10/11/2016 0542 10/11/2016 1556 Full Code [id number]  Oswald Hillock, MD Inpatient   08/04/2016 0554 08/05/2016 1806 Full Code [id number]  Orvan Falconer, MD Inpatient   06/20/2016 0636 06/20/2016 2002 Full Code [id number]  Oswald Hillock, MD Inpatient   05/16/2016 1257 05/18/2016 1415 Full Code [id number]  Rosita Fire, MD ED   03/24/2016 1714 03/26/2016 1554 Full Code [id number]  Orvan Falconer, MD Inpatient   03/17/2016 2341 03/18/2016 1938 Full Code [id number]  Orvan Falconer, MD Inpatient   03/11/2016 1849 03/15/2016 1512  Full Code [id number]  Leonie Man, MD Inpatient   09/05/2015 2330 09/06/2015 2117 Full Code [id number]  Wandra Mannan Inpatient         IV Access:   Peripheral IV   Procedures and diagnostic studies:   CT Angio Head W or Wo Contrast  Result Date: 10/19/2020 CLINICAL DATA:  Left-sided numbness and fall EXAM: CT ANGIOGRAPHY HEAD AND NECK TECHNIQUE: Multidetector CT imaging of the head and neck was performed using the standard protocol during bolus administration of intravenous contrast. Multiplanar CT image reconstructions and MIPs were obtained to evaluate the vascular anatomy. Carotid stenosis measurements (when applicable) are obtained utilizing NASCET criteria, using the distal internal carotid diameter as the denominator. CONTRAST:  74mL OMNIPAQUE IOHEXOL 350 MG/ML SOLN, 38mL OMNIPAQUE IOHEXOL 350 MG/ML SOLN, 17mL OMNIPAQUE IOHEXOL 350 MG/ML SOLN COMPARISON:  12/10/2019 FINDINGS: CT HEAD FINDINGS Brain: There is no mass, hemorrhage or extra-axial collection. The size and configuration of the ventricles and extra-axial CSF spaces are normal. There is no acute or chronic infarction. The brain parenchyma is normal. Skull: The visualized skull base, calvarium and extracranial soft tissues are normal. Sinuses/Orbits: No fluid levels or advanced mucosal thickening of the visualized paranasal sinuses. No mastoid or middle ear effusion. The orbits are normal. CTA NECK FINDINGS SKELETON: There is no bony spinal canal stenosis. No lytic or blastic lesion. OTHER NECK: Normal pharynx, larynx and major salivary glands. No cervical lymphadenopathy. Unremarkable thyroid gland. UPPER CHEST: 7 mm ground-glass nodule in the left lung apex previously 6 mm (8:25). AORTIC ARCH: There is calcific atherosclerosis of the aortic arch. There is no aneurysm, dissection or hemodynamically significant stenosis of the visualized portion of the aorta. Normal variant aortic arch branching pattern with the brachiocephalic and  left common carotid arteries sharing a common origin. The visualized proximal subclavian arteries are widely patent. RIGHT CAROTID SYSTEM: No dissection, occlusion or aneurysm. There is mixed density atherosclerosis extending into the proximal ICA, resulting in less than 50% stenosis. LEFT CAROTID SYSTEM: No dissection, occlusion or aneurysm. There is mixed density atherosclerosis extending into the proximal ICA, resulting in approximately 70% stenosis. VERTEBRAL ARTERIES: Left dominant configuration. Both origins are clearly patent. There is no dissection, occlusion or flow-limiting stenosis to the skull base (V1-V3 segments). CTA HEAD FINDINGS POSTERIOR CIRCULATION: --Vertebral arteries: Normal V4 segments. --Inferior cerebellar arteries: Normal. --Basilar artery: Normal. --Superior cerebellar arteries: Normal. --Posterior cerebral arteries (PCA): Normal. ANTERIOR CIRCULATION: --Intracranial internal carotid arteries: Left ICA terminus stent is patent. No aneurysm filling. --Anterior cerebral arteries (ACA): Normal. Both A1 segments are present. Patent anterior communicating artery (a-comm). --Middle cerebral arteries (MCA): Normal. VENOUS SINUSES: As permitted by contrast timing, patent. ANATOMIC VARIANTS: None Review of the MIP images confirms the above findings. IMPRESSION: 1. Patent left ICA terminus stent without aneurysm filling. 2. Approximately  70% stenosis of the proximal left internal carotid artery due to mixed density atherosclerosis. 3. Slight increase in size of left upper lobe ground-glass nodule, now measuring 7 mm, previously 6 mm. Initial follow-up with CT at 6-12 months is recommended to confirm persistence. If persistent, repeat CT is recommended every 2 years until 5 years of stability has been established. This recommendation follows the consensus statement: Guidelines for Management of Incidental Pulmonary Nodules Detected on CT Images: From the Fleischner Society 2017; Radiology 2017;  284:228-243. Aortic Atherosclerosis (ICD10-I70.0). Electronically Signed   By: Ulyses Jarred M.D.   On: 10/19/2020 19:41   CT Angio Neck W and/or Wo Contrast  Result Date: 10/19/2020 CLINICAL DATA:  Left-sided numbness and fall EXAM: CT ANGIOGRAPHY HEAD AND NECK TECHNIQUE: Multidetector CT imaging of the head and neck was performed using the standard protocol during bolus administration of intravenous contrast. Multiplanar CT image reconstructions and MIPs were obtained to evaluate the vascular anatomy. Carotid stenosis measurements (when applicable) are obtained utilizing NASCET criteria, using the distal internal carotid diameter as the denominator. CONTRAST:  44mL OMNIPAQUE IOHEXOL 350 MG/ML SOLN, 28mL OMNIPAQUE IOHEXOL 350 MG/ML SOLN, 38mL OMNIPAQUE IOHEXOL 350 MG/ML SOLN COMPARISON:  12/10/2019 FINDINGS: CT HEAD FINDINGS Brain: There is no mass, hemorrhage or extra-axial collection. The size and configuration of the ventricles and extra-axial CSF spaces are normal. There is no acute or chronic infarction. The brain parenchyma is normal. Skull: The visualized skull base, calvarium and extracranial soft tissues are normal. Sinuses/Orbits: No fluid levels or advanced mucosal thickening of the visualized paranasal sinuses. No mastoid or middle ear effusion. The orbits are normal. CTA NECK FINDINGS SKELETON: There is no bony spinal canal stenosis. No lytic or blastic lesion. OTHER NECK: Normal pharynx, larynx and major salivary glands. No cervical lymphadenopathy. Unremarkable thyroid gland. UPPER CHEST: 7 mm ground-glass nodule in the left lung apex previously 6 mm (8:25). AORTIC ARCH: There is calcific atherosclerosis of the aortic arch. There is no aneurysm, dissection or hemodynamically significant stenosis of the visualized portion of the aorta. Normal variant aortic arch branching pattern with the brachiocephalic and left common carotid arteries sharing a common origin. The visualized proximal subclavian  arteries are widely patent. RIGHT CAROTID SYSTEM: No dissection, occlusion or aneurysm. There is mixed density atherosclerosis extending into the proximal ICA, resulting in less than 50% stenosis. LEFT CAROTID SYSTEM: No dissection, occlusion or aneurysm. There is mixed density atherosclerosis extending into the proximal ICA, resulting in approximately 70% stenosis. VERTEBRAL ARTERIES: Left dominant configuration. Both origins are clearly patent. There is no dissection, occlusion or flow-limiting stenosis to the skull base (V1-V3 segments). CTA HEAD FINDINGS POSTERIOR CIRCULATION: --Vertebral arteries: Normal V4 segments. --Inferior cerebellar arteries: Normal. --Basilar artery: Normal. --Superior cerebellar arteries: Normal. --Posterior cerebral arteries (PCA): Normal. ANTERIOR CIRCULATION: --Intracranial internal carotid arteries: Left ICA terminus stent is patent. No aneurysm filling. --Anterior cerebral arteries (ACA): Normal. Both A1 segments are present. Patent anterior communicating artery (a-comm). --Middle cerebral arteries (MCA): Normal. VENOUS SINUSES: As permitted by contrast timing, patent. ANATOMIC VARIANTS: None Review of the MIP images confirms the above findings. IMPRESSION: 1. Patent left ICA terminus stent without aneurysm filling. 2. Approximately 70% stenosis of the proximal left internal carotid artery due to mixed density atherosclerosis. 3. Slight increase in size of left upper lobe ground-glass nodule, now measuring 7 mm, previously 6 mm. Initial follow-up with CT at 6-12 months is recommended to confirm persistence. If persistent, repeat CT is recommended every 2 years until 5 years of stability  has been established. This recommendation follows the consensus statement: Guidelines for Management of Incidental Pulmonary Nodules Detected on CT Images: From the Fleischner Society 2017; Radiology 2017; 284:228-243. Aortic Atherosclerosis (ICD10-I70.0). Electronically Signed   By: Ulyses Jarred  M.D.   On: 10/19/2020 19:41     Medical Consultants:   None.   Subjective:    [name] she relates she still has left-sided weakness especially in her leg  Objective:    Vitals:   10/19/20 2230 10/19/20 2300 10/20/20 0308 10/20/20 0545  BP: 96/71 112/74 (!) 87/61 90/62  Pulse: 76 83 80 81  Resp: 16 (!) 24  16  Temp:   98.1 F (36.7 C) 98.1 F (36.7 C)  TempSrc:   Oral Oral  SpO2: 99% 100% 99% 97%  Weight:      Height:       SpO2: 97 %   Intake/Output Summary (Last 24 hours) at 10/20/2020 0731 Last data filed at 10/20/2020 0114 Gross per 24 hour  Intake 242.15 ml  Output --  Net 242.15 ml   Filed Weights   10/19/20 1741  Weight: 52.6 kg    Exam: General exam: In no acute distress. Respiratory system: Good air movement and clear to auscultation. Cardiovascular system: S1 & S2 heard, RRR. No JVD Gastrointestinal system: Abdomen is nondistended, soft and nontender.  Central nervous system: Awake alert and oriented x3 3-12 are grossly intact sensation is intact throughout muscle strength 5 5 in all 4 extremities except for left lower extremity.  Deep tendon reflexes are preserved and symmetrical. Extremities: No pedal edema. Skin: No rashes, lesions or ulcers Psychiatry: Judgement and insight appear normal. Mood & affect appropriate.    Data Reviewed:    Labs: Basic Metabolic Panel: Recent Labs  Lab 10/19/20 1752  NA 136  K 4.0  CL 101  CO2 25  GLUCOSE 92  BUN 21*  CREATININE 1.22*  CALCIUM 9.1   GFR Estimated Creatinine Clearance: 36.1 mL/min (A) (by C-G formula based on SCr of 1.22 mg/dL (H)). Liver Function Tests: Recent Labs  Lab 10/19/20 1752  AST 27  ALT 22  ALKPHOS 71  BILITOT 1.1  PROT 7.2  ALBUMIN 4.2   No results for input(s): LIPASE, AMYLASE in the last 168 hours. No results for input(s): AMMONIA in the last 168 hours. Coagulation profile Recent Labs  Lab 10/19/20 1752  INR 0.9   COVID-19 Labs  No results for  input(s): DDIMER, FERRITIN, LDH, CRP in the last 72 hours.  Lab Results  Component Value Date   SARSCOV2NAA NEGATIVE 10/19/2020   [place] NEGATIVE 06/27/2020   [place] NEGATIVE 04/01/2020   [place] NEGATIVE 03/17/2020    CBC: Recent Labs  Lab 10/19/20 1752  WBC 8.6  NEUTROABS 5.1  HGB 12.8  HCT 38.7  MCV 89.2  PLT 400   Cardiac Enzymes: No results for input(s): CKTOTAL, CKMB, CKMBINDEX, TROPONINI in the last 168 hours. BNP (last 3 results) No results for input(s): PROBNP in the last 8760 hours. CBG: Recent Labs  Lab 10/19/20 2246  GLUCAP 77   D-Dimer: No results for input(s): DDIMER in the last 72 hours. Hgb A1c: No results for input(s): HGBA1C in the last 72 hours. Lipid Profile: No results for input(s): CHOL, HDL, LDLCALC, TRIG, CHOLHDL, LDLDIRECT in the last 72 hours. Thyroid function studies: No results for input(s): TSH, T4TOTAL, T3FREE, THYROIDAB in the last 72 hours.  Invalid input(s): FREET3 Anemia work up: No results for input(s): VITAMINB12, FOLATE, FERRITIN, TIBC, IRON,  RETICCTPCT in the last 72 hours. Sepsis Labs: Recent Labs  Lab 10/19/20 1752  WBC 8.6   Microbiology Recent Results (from the past 240 hour(s))  Resp Panel by RT-PCR (Flu A&B, Covid) Nasopharyngeal Swab     Status: None   Collection Time: 10/19/20  6:01 PM   Specimen: Nasopharyngeal Swab; Nasopharyngeal(NP) swabs in vial transport medium  Result Value Ref Range Status   SARS Coronavirus 2 by RT PCR NEGATIVE NEGATIVE Final    Comment: (NOTE) SARS-CoV-2 target nucleic acids are NOT DETECTED.  The SARS-CoV-2 RNA is generally detectable in upper respiratory specimens during the acute phase of infection. The lowest concentration of SARS-CoV-2 viral copies this assay can detect is 138 copies/mL. A negative result does not preclude SARS-Cov-2 infection and should not be used as the sole basis for treatment or other patient management decisions. A negative result may occur  with  improper specimen collection/handling, submission of specimen other than nasopharyngeal swab, presence of viral mutation(s) within the areas targeted by this assay, and inadequate number of viral copies(<138 copies/mL). A negative result must be combined with clinical observations, patient history, and epidemiological information. The expected result is Negative.  Fact Sheet for Patients:  EntrepreneurPulse.com.au  Fact Sheet for Healthcare Providers:  IncredibleEmployment.be  This test is no t yet approved or cleared by the [place] FDA and  has been authorized for detection and/or diagnosis of SARS-CoV-2 by FDA under an Emergency Use Authorization (EUA). This EUA will remain  in effect (meaning this test can be used) for the duration of the COVID-19 declaration under Section 564(b)(1) of the Act, 21 U.S.C.section 360bbb-3(b)(1), unless the authorization is terminated  or revoked sooner.       Influenza A by PCR NEGATIVE NEGATIVE Final   Influenza B by PCR NEGATIVE NEGATIVE Final    Comment: (NOTE) The Xpert Xpress SARS-CoV-2/FLU/RSV plus assay is intended as an aid in the diagnosis of influenza from Nasopharyngeal swab specimens and should not be used as a sole basis for treatment. Nasal washings and aspirates are unacceptable for Xpert Xpress SARS-CoV-2/FLU/RSV testing.  Fact Sheet for Patients: EntrepreneurPulse.com.au  Fact Sheet for Healthcare Providers: IncredibleEmployment.be  This test is not yet approved or cleared by the [place] FDA and has been authorized for detection and/or diagnosis of SARS-CoV-2 by FDA under an Emergency Use Authorization (EUA). This EUA will remain in effect (meaning this test can be used) for the duration of the COVID-19 declaration under Section 564(b)(1) of the Act, 21 U.S.C. section 360bbb-3(b)(1), unless the authorization is terminated  or revoked.  Performed at [facility], [address], [place], [place] [address]      Medications:     stroke: mapping our early stages of recovery book   Does not apply Once   aspirin EC  81 mg Oral QPM   atorvastatin  80 mg Oral Daily   carvedilol  3.125 mg Oral BID   clopidogrel  75 mg Oral Daily   enoxaparin (LOVENOX) injection  30 mg Subcutaneous Q24H   ezetimibe  10 mg Oral Daily   furosemide  20 mg Oral Daily   gabapentin  200 mg Oral QHS   insulin aspart  0-15 Units Subcutaneous TID WC   insulin aspart  0-5 Units Subcutaneous QHS   LORazepam  1 mg Oral QHS   pantoprazole  40 mg Oral Daily   rOPINIRole  0.5 mg Oral QHS   sacubitril-valsartan  1 tablet Oral BID   spironolactone  12.5 mg Oral  Daily   Continuous Infusions:  sodium chloride 100 mL/hr (10/20/20 0342)      LOS: 0 days   Charlynne Cousins  [facility]  10/20/2020, 7:31 AM

## 2020-10-20 NOTE — Progress Notes (Signed)
PT Cancellation Note  Patient Details Name: Erin [name] MRN: [id number] DOB: 04-08-1961   Cancelled Treatment:    Reason Eval/Treat Not Completed: Patient at procedure or test/unavailable (Pt in MRI. Will check back as able.)   Alvira Philips 10/20/2020, 11:31 AM Jorie Zee M,PT Acute Rehab Services [phone] (pager)

## 2020-10-20 NOTE — TOC Initial Note (Signed)
Transition of Care [facility]) - Initial/Assessment Note    Patient Details  Name: Erin [name] MRN: [id number] Date of Birth: Feb 08, 1961  [facility]) CM/SW Contact:    Pollie Friar, RN Phone Number: 10/20/2020, 12:39 PM  Clinical Narrative:                 Patient lives at home with spouse and extended family. She states she will have needed supervision. No current DME at home but states they have access to a shower chair.  Recommendations are for outpatient OT. Pt prefers to attend at [facility] outpatient rehab. Information on the AVS.  CM following for further d/c needs.   Expected Discharge Plan: OP Rehab Barriers to Discharge: Continued Medical Work up   Patient Goals and CMS Choice   CMS Medicare.gov Compare Post Acute Care list provided to:: Patient Choice offered to / list presented to : Patient  Expected Discharge Plan and Services Expected Discharge Plan: OP Rehab   Discharge Planning Services: CM Consult   Living arrangements for the past 2 months: Single Family Home                                      Prior Living Arrangements/Services Living arrangements for the past 2 months: Single Family Home Lives with:: Spouse, Adult Children Patient language and need for interpreter reviewed:: Yes Do you feel safe going back to the place where you live?: Yes      Need for Family Participation in Patient Care: Yes (Comment) Care giver support system in place?: Yes (comment)   Criminal Activity/Legal Involvement Pertinent to Current Situation/Hospitalization: No - Comment as needed  Activities of Daily Living Home Assistive Devices/Equipment: Dentures (specify type), Scales, Eyeglasses (and a heart monitor) ADL Screening (condition at time of admission) Patient's cognitive ability adequate to safely complete daily activities?: Yes Is the patient deaf or have difficulty hearing?: No Does the patient have difficulty seeing, even when wearing  glasses/contacts?: No Does the patient have difficulty concentrating, remembering, or making decisions?: No Patient able to express need for assistance with ADLs?: Yes Does the patient have difficulty dressing or bathing?: No Independently performs ADLs?: Yes (appropriate for developmental age) Does the patient have difficulty walking or climbing stairs?: No Weakness of Legs: Both Weakness of Arms/Hands: Both  Permission Sought/Granted                  Emotional Assessment Appearance:: Appears stated age Attitude/Demeanor/Rapport: Engaged Affect (typically observed): Accepting Orientation: : Oriented to Self, Oriented to Place, Oriented to  Time, Oriented to Situation   Psych Involvement: No (comment)  Admission diagnosis:  CVA (cerebral vascular accident) ([place]) [I63.9] Pulmonary nodule [R91.1] Carotid stenosis, left [I65.22] Stroke-like symptom [R29.90] Patient Active Problem List   Diagnosis Date Noted   CVA (cerebral vascular accident) ([place]) 10/19/2020   Cough 02/11/2020   Near syncope 12/10/2019   Brain aneurysm 04/15/2019   Dysphagia 02/27/2018   Encounter for screening colonoscopy 02/27/2018   History of Clostridium difficile infection 02/27/2018   Frequent stools 12/20/2017   Chronic combined systolic and diastolic CHF (congestive heart failure) ([place]) 06/20/2016   Hypokalemia    Acute CHF (congestive heart failure) ([place]) 05/16/2016   Acute on chronic systolic CHF (congestive heart failure) ([place]) 05/16/2016   Acute respiratory failure with hypoxia (HCC)    Thrombocytosis 03/26/2016   Cardiomyopathy, ischemic 03/25/2016   Chronic combined  systolic and diastolic heart failure ([place]) 03/25/2016   Anxiety state 03/25/2016   Troponin level elevated 03/25/2016   CAD (coronary artery disease), native coronary artery 03/25/2016   Normocytic anemia 03/25/2016   SOB (shortness of breath) 03/24/2016   Lightheadedness 03/17/2016   Hypotension 03/17/2016   Tobacco abuse  03/12/2016   NSTEMI (non-ST elevated myocardial infarction) ([place]) 03/11/2016   Atypical chest pain    Essential hypertension 09/06/2015   Mixed hyperlipidemia 09/06/2015   GERD (gastroesophageal reflux disease) 09/06/2015   Chest pain 09/06/2015   PCP:  [name], MD Pharmacy:   [facility] [address], [place] [address] Phone: [phone] Fax: [phone]     Social Determinants of Health (SDOH) Interventions    Readmission Risk Interventions No flowsheet data found.

## 2020-10-20 NOTE — Evaluation (Signed)
Speech Language Pathology Evaluation Patient Details Name: Erin [name] MRN: [id number] DOB: 1961-03-30 Today's Date: 10/20/2020 Time: 8127-5170 SLP Time Calculation (min) (ACUTE ONLY): 18 min  Problem List:  Patient Active Problem List   Diagnosis Date Noted   CVA (cerebral vascular accident) ([place]) 10/19/2020   Cough 02/11/2020   Near syncope 12/10/2019   Brain aneurysm 04/15/2019   Dysphagia 02/27/2018   Encounter for screening colonoscopy 02/27/2018   History of Clostridium difficile infection 02/27/2018   Frequent stools 12/20/2017   Chronic combined systolic and diastolic CHF (congestive heart failure) ([place]) 06/20/2016   Hypokalemia    Acute CHF (congestive heart failure) ([place]) 05/16/2016   Acute on chronic systolic CHF (congestive heart failure) ([place]) 05/16/2016   Acute respiratory failure with hypoxia (HCC)    Thrombocytosis 03/26/2016   Cardiomyopathy, ischemic 03/25/2016   Chronic combined systolic and diastolic heart failure ([place]) 03/25/2016   Anxiety state 03/25/2016   Troponin level elevated 03/25/2016   CAD (coronary artery disease), native coronary artery 03/25/2016   Normocytic anemia 03/25/2016   SOB (shortness of breath) 03/24/2016   Lightheadedness 03/17/2016   Hypotension 03/17/2016   Tobacco abuse 03/12/2016   NSTEMI (non-ST elevated myocardial infarction) ([place]) 03/11/2016   Atypical chest pain    Essential hypertension 09/06/2015   Mixed hyperlipidemia 09/06/2015   GERD (gastroesophageal reflux disease) 09/06/2015   Chest pain 09/06/2015   Past Medical History:  Past Medical History:  Diagnosis Date   Anxiety state 03/25/2016   Basal cell carcinoma of forehead    Brain aneurysm    CHF (congestive heart failure) ([place])    Coronary artery disease    a. 03/11/16 PCI with DES-->Prox/Mid Cx;  b. 03/14/16 PCI with DES x2-->RCA, EF 30-35%.   Essential hypertension    GERD (gastroesophageal reflux disease)    HFrEF (heart failure with reduced ejection  fraction) ([place])    a. 10/2016 Echo: EF 35-40%, Gr1 DD, mild focal basal septal hypertrophy, basal inflat, mid inflat, basal antlat AK. Mid infept/inf/antlat, apical lateral sev HK. Mod MR. mildly reduced RV fxn. Mild TR.   History of pneumonia    Hyperlipidemia    IBS (irritable bowel syndrome)    Ischemic cardiomyopathy    a. 10/2016 Echo: EF 35-40%, Gr1 DD.   Mitral regurgitation    NSTEMI (non-ST elevated myocardial infarction) ([place]) 03/10/2016   Pneumonia 03/2016   Squamous cell cancer of skin of nose    Thrombocytosis 03/26/2016   Tobacco abuse    Trichimoniasis    Wears dentures    Wears glasses    Past Surgical History:  Past Surgical History:  Procedure Laterality Date   APPENDECTOMY     BIOPSY  09/20/2018   Procedure: BIOPSY;  Surgeon: Daneil Dolin, MD;  [facility];  Service: Endoscopy;;  colon   CARDIAC CATHETERIZATION N/A 03/11/2016   Procedure: Left Heart Cath and Coronary Angiography;  Surgeon: Leonie Man, MD;  Location: [place] [facility];  Service: Cardiovascular;  Laterality: N/A;   CARDIAC CATHETERIZATION N/A 03/11/2016   Procedure: Coronary Stent Intervention;  Surgeon: Leonie Man, MD;  Location: [place] [facility];  Service: Cardiovascular;  Laterality: N/A;   CARDIAC CATHETERIZATION N/A 03/14/2016   Procedure: Coronary Stent Intervention;  Surgeon: Peter M Martinique, MD;  Location: [place] [facility];  Service: Cardiovascular;  Laterality: N/A;   CHOLECYSTECTOMY OPEN  1984   COLONOSCOPY WITH PROPOFOL N/A 09/20/2018   Procedure: COLONOSCOPY WITH PROPOFOL;  Surgeon: Daneil Dolin, MD;  [facility];  Service: Endoscopy;  Laterality: N/A;  10:30am   CORONARY ANGIOPLASTY WITH STENT PLACEMENT  03/14/2016   ESOPHAGOGASTRODUODENOSCOPY (EGD) WITH PROPOFOL N/A 09/20/2018   Procedure: ESOPHAGOGASTRODUODENOSCOPY (EGD) WITH PROPOFOL;  Surgeon: Daneil Dolin, MD;  [facility];  Service: Endoscopy;  Laterality: N/A;   FINGER  ARTHROPLASTY Left 05/14/2013   Procedure: LEFT THUMB CARPAL METACARPAL ARTHROPLASTY;  Surgeon: Tennis Must, MD;  Location: [place];  Service: Orthopedics;  Laterality: Left;   ICD IMPLANT N/A 04/03/2020   Procedure: ICD IMPLANT;  Surgeon: Constance Haw, MD;  Location: [place] [facility];  Service: Cardiovascular;  Laterality: N/A;   IR ANGIO INTRA EXTRACRAN SEL COM CAROTID INNOMINATE BILAT MOD SED  01/05/2017   IR ANGIO INTRA EXTRACRAN SEL COM CAROTID INNOMINATE BILAT MOD SED  03/19/2019   IR ANGIO INTRA EXTRACRAN SEL COM CAROTID INNOMINATE BILAT MOD SED  06/04/2020   IR ANGIO INTRA EXTRACRAN SEL INTERNAL CAROTID UNI L MOD SED  04/15/2019   IR ANGIO VERTEBRAL SEL VERTEBRAL BILAT MOD SED  01/05/2017   IR ANGIO VERTEBRAL SEL VERTEBRAL BILAT MOD SED  03/19/2019   IR ANGIO VERTEBRAL SEL VERTEBRAL UNI L MOD SED  06/04/2020   IR ANGIOGRAM FOLLOW UP STUDY  04/15/2019   IR RADIOLOGIST EVAL & MGMT  12/30/2016   IR TRANSCATH/EMBOLIZ  04/15/2019   IR US GUIDE VASC ACCESS RIGHT  03/19/2019   IR US GUIDE VASC ACCESS RIGHT  06/04/2020   MALONEY DILATION N/A 09/20/2018   Procedure: Venia Minks DILATION;  Surgeon: Daneil Dolin, MD;  [facility];  Service: Endoscopy;  Laterality: N/A;   RADIOLOGY WITH ANESTHESIA N/A 04/15/2019   Procedure: [employer];  Surgeon: Luanne Bras, MD;  Location: [place];  Service: Radiology;  Laterality: N/A;   RIGHT/LEFT HEART CATH AND CORONARY ANGIOGRAPHY N/A 08/19/2019   Procedure: RIGHT/LEFT HEART CATH AND CORONARY ANGIOGRAPHY;  Surgeon: Larey Dresser, MD;  Location: [place] [facility] LAB;  Service: Cardiovascular;  Laterality: N/A;   TUBAL LIGATION  1987   VAGINAL HYSTERECTOMY  2009   HPI:  Erin [name] is an 60 y.o. female admitted 10/19/20 with left-sided numbness and weakness (started 10/17/20) which caused a fall. CT negative for acute abnormality, MRI negative for acute event. PMH includes: anxiety, brain aneurysm status post repair,  chronic heart failure, coronary disease, with ongoing tobacco abuse of a pack a day   Assessment / Plan / Recommendation Clinical Impression  Pt presents with fluent and clear speech; comprehension and expression are WNL.  Cognitive processes of orientation, higher-level attention, basic verbal recall, and verbal problem-solving were all WNL. Reading WNL. No deficits identified that warrant SLP f/u.  Our service will sign off.    SLP Assessment  SLP Recommendation/Assessment: Patient does not need any further Speech Lanaguage Pathology Services SLP Visit Diagnosis: Cognitive communication deficit (R41.841)    Follow Up Recommendations    none   Frequency and Duration     N/a      SLP Evaluation Cognition  Overall Cognitive Status: Within Functional Limits for tasks assessed Arousal/Alertness: Awake/alert Orientation Level: Oriented X4 Attention: Selective;Alternating Selective Attention: Appears intact Alternating Attention: Appears intact Memory: Appears intact Awareness: Appears intact Problem Solving: Appears intact Safety/Judgment: Appears intact       Comprehension  Auditory Comprehension Overall Auditory Comprehension: Appears within functional limits for tasks assessed Reading Comprehension Reading Status: Within funtional limits    Expression Expression Primary Mode of Expression: Verbal Verbal  Expression Overall Verbal Expression: Appears within functional limits for tasks assessed Written Expression Dominant Hand: Right   Oral / Motor  Motor Speech Overall Motor Speech: Appears within functional limits for tasks assessed   GO                    Juan Quam Laurice 10/20/2020, 1:12 PM Sayge Salvato L. Tivis Ringer, [place] Office number [phone] Pager [phone]

## 2020-10-20 NOTE — Evaluation (Signed)
Physical Therapy Evaluation Patient Details Name: Erin [name] MRN: [id number] DOB: 07-14-1960 Today's Date: 10/20/2020   History of Present Illness  Erin [name] is an 60 y.o. female admitted 10/19/20 with left-sided numbness and weakness (started 10/17/20) which caused a fall. CT negative for acute abnormality, MRI showed remote left parietoccipital infarct. PMH includes: anxiety, brain aneurysm status post repair, chronic heart failure, coronary disease, with ongoing tobacco abuse of a pack a day  Clinical Impression  Pt admitted with above diagnosis. Pt presents with intermittent episodes of weakness and numbness LLE with the worst being yesterday when her LLE gave way and she fell. Pt continues to reports tingling and numbness LLE as well as feeling of heaviness LLE while ambulating. Noted to have LLE lag during gait, no LOB or knee giving way. Pt ambulated 150' on eval without AD with min-guard A for safety.  Would benefit from outpt PT to return to PLOF to be able to return to work. Pt currently with functional limitations due to the deficits listed below (see PT Problem List). Pt will benefit from skilled PT to increase their independence and safety with mobility to allow discharge to the venue listed below.       Follow Up Recommendations Outpatient PT    Equipment Recommendations  None recommended by PT    Recommendations for Other Services       Precautions / Restrictions Precautions Precautions: Fall Precaution Comments: L sided numbness Restrictions Weight Bearing Restrictions: No      Mobility  Bed Mobility Overal bed mobility: Modified Independent                  Transfers Overall transfer level: Needs assistance Equipment used: None Transfers: Sit to/from Stand Sit to Stand: Supervision         General transfer comment: supervision for safety but no LOB  Ambulation/Gait Ambulation/Gait assistance: Min guard Gait Distance (Feet): 150  Feet Assistive device: None Gait Pattern/deviations: Decreased weight shift to left;Decreased dorsiflexion - left Gait velocity: decreased Gait velocity interpretation: 1.31 - 2.62 ft/sec, indicative of limited community ambulator General Gait Details: noted lower step height left though pt does not have foot drop. Mild delay in LLE catching up to R. Min guard for safety  Stairs Stairs: Yes Stairs assistance: Supervision Stair Management: Two rails;Alternating pattern;Forwards Number of Stairs: 5 General stair comments: reliance on rails due to LE weakness  Wheelchair Mobility    Modified Rankin (Stroke Patients Only) Modified Rankin (Stroke Patients Only) Pre-Morbid Rankin Score: No significant disability Modified Rankin: Moderate disability     Balance Overall balance assessment: Needs assistance Sitting-balance support: No upper extremity supported Sitting balance-Leahy Scale: Good     Standing balance support: No upper extremity supported;Single extremity supported;During functional activity Standing balance-Leahy Scale: Fair Standing balance comment: delayed responses to perturbation                             Pertinent Vitals/Pain Pain Assessment: No/denies pain Pain Score: 0-No pain Pain Intervention(s): Monitored during session;Repositioned    Home Living Family/patient expects to be discharged to:: Private residence Living Arrangements: Spouse/significant other;Children Available Help at Discharge: Available 24 hours/day Type of Home: House Home Access: Stairs to enter Entrance Stairs-Rails: None Entrance Stairs-Number of Steps: 3 Home Layout: Two level;Able to live on main level with bedroom/bathroom Home Equipment: None      Prior Function Level of Independence: Independent  Comments: Hydrographic surveyor, drives, 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 Pt reports she has had intermittent episodes of this since her heart attack but none as bad as 10/19/20 when she fell LLE Sensation: decreased light touch;decreased proprioception LLE Coordination: decreased gross motor    Cervical / Trunk Assessment Cervical / Trunk Assessment: Normal  Communication   Communication: No difficulties  Cognition Arousal/Alertness: Awake/alert Behavior During Therapy: WFL for tasks assessed/performed Overall Cognitive Status: No family/caregiver present to determine baseline cognitive functioning Area of Impairment: Safety/judgement                         Safety/Judgement: Decreased awareness of safety     General Comments: cues needed for safety      General Comments General comments (skin integrity, edema, etc.): limited evaluation as Pt is going down to MRI    Exercises     Assessment/Plan    PT Assessment Patient needs continued PT services  PT Problem List Decreased strength;Impaired sensation;Decreased coordination;Decreased balance       PT Treatment Interventions Gait training;Stair training;Functional mobility training;Therapeutic activities;Therapeutic exercise;Balance training;Patient/family education    PT Goals (Current goals can be found in the Care Plan section)  Acute Rehab PT Goals Patient Stated Goal: get L side back to normal PT Goal Formulation: With patient Time For Goal Achievement: 11/03/20 Potential to Achieve Goals: Good    Frequency Min 3X/week    Barriers to discharge        Co-evaluation               AM-PAC PT "6 Clicks" Mobility  Outcome Measure Help needed turning from your back to your side while in a flat bed without using bedrails?: None Help needed moving from lying on your back to sitting on the side of a flat bed without using bedrails?: None Help needed moving to and from a bed to a chair (including a wheelchair)?: None Help needed standing up from a chair using your arms (e.g., wheelchair or bedside chair)?: A Little Help needed to walk in hospital room?: A Little Help needed climbing 3-5 steps with a railing? : A Little 6 Click Score: 21    End of Session Equipment Utilized During Treatment: Gait belt Activity Tolerance: Patient tolerated treatment well Patient left: in bed;with call bell/phone within reach;with bed alarm set Nurse Communication: Mobility status PT Visit Diagnosis: Unsteadiness on feet (R26.81);History of falling (Z91.81);Hemiplegia and hemiparesis Hemiplegia - Right/Left: Left Hemiplegia - dominant/non-dominant: Non-dominant Hemiplegia - caused by: Cerebral infarction    Time: 1093-2355 PT Time Calculation (min) (ACUTE ONLY): 16 min   Charges:   PT Evaluation $PT Eval Moderate Complexity: [place], PT  Acute Rehab Services  Pager [phone] Office [place] 10/20/2020, 2:13 PM

## 2020-10-21 ENCOUNTER — Encounter (HOSPITAL_COMMUNITY): Payer: 59 | Admitting: Cardiology

## 2020-10-21 ENCOUNTER — Inpatient Hospital Stay (HOSPITAL_COMMUNITY): Payer: 59

## 2020-10-21 DIAGNOSIS — I5022 Chronic systolic (congestive) heart failure: Secondary | ICD-10-CM

## 2020-10-21 DIAGNOSIS — E119 Type 2 diabetes mellitus without complications: Secondary | ICD-10-CM

## 2020-10-21 LAB — BASIC METABOLIC PANEL
Anion gap: 7 (ref 5–15)
BUN: 10 mg/dL (ref 6–20)
CO2: 21 mmol/L — ABNORMAL LOW (ref 22–32)
Calcium: 9 mg/dL (ref 8.9–10.3)
Chloride: 110 mmol/L (ref 98–111)
Creatinine, Ser: 0.87 mg/dL (ref 0.44–1.00)
GFR, Estimated: >60 mL/min (ref >60)
Glucose, Bld: 120 mg/dL — ABNORMAL HIGH (ref 70–99)
Potassium: 3.8 mmol/L (ref 3.5–5.1)
Sodium: 138 mmol/L (ref 135–145)

## 2020-10-21 LAB — HEMOGLOBIN A1C
Hgb A1c MFr Bld: 5.6 % (ref 4.8–5.6)
Mean Plasma Glucose: 114 mg/dL

## 2020-10-21 NOTE — Progress Notes (Signed)
Physical Therapy Treatment Patient Details Name: Erin [name] MRN: [id number] DOB: 03-Feb-1961 Today's Date: 10/21/2020    History of Present Illness Erin [name] is an 60 y.o. female admitted 10/19/20 with left-sided numbness and weakness (started 10/17/20) which caused a fall. CT negative for acute abnormality, MRI showed remote left parietoccipital infarct. PMH includes: anxiety, brain aneurysm status post repair, chronic heart failure, coronary disease, with ongoing tobacco abuse of a pack a day    PT Comments    Pt tolerates gait of increased distances, compared to last session and does not require physical assistance to perform. Pt accepts dynamic gait challenges including head turns, obstacle negotiation, changes in gait speed, sudden stops,and turns without any overt LOB noted. Pt performs stair negotiation and reports no difficulty. Pt  will benefit from continued acute PT to improve safety and independence in mobility and balance.   Follow Up Recommendations  No PT follow up     Equipment Recommendations  None recommended by PT    Recommendations for Other Services       Precautions / Restrictions Precautions Precautions: Fall Restrictions Weight Bearing Restrictions: No    Mobility  Bed Mobility               General bed mobility comments: Pt received in recliner    Transfers Overall transfer level: Modified independent Equipment used: None Transfers: Sit to/from Stand              Ambulation/Gait Ambulation/Gait assistance: Supervision Gait Distance (Feet): 1000 Feet Assistive device: None Gait Pattern/deviations: Step-through pattern;Decreased stride length Gait velocity: decreased Gait velocity interpretation: 1.31 - 2.62 ft/sec, indicative of limited community ambulator General Gait Details: supervision for safety. Pt accepts dynamic gait challenges without LOB noted.   Stairs Stairs: Yes Stairs assistance: Supervision Stair  Management: One rail Right;One rail Left;Alternating pattern;Forwards Number of Stairs: 11 (full flight) General stair comments: Pt performs stair ascent with R hand rail and stair descent with L hand rail use. No LOB observed, supervision for safety.   Wheelchair Mobility    Modified Rankin (Stroke Patients Only)       Balance Overall balance assessment: Needs assistance Sitting-balance support: No upper extremity supported Sitting balance-Leahy Scale: Good     Standing balance support: During functional activity Standing balance-Leahy Scale: Good Standing balance comment: Pt tolerates dynamic gait activities without noted LOB                            Cognition Arousal/Alertness: Awake/alert Behavior During Therapy: WFL for tasks assessed/performed Overall Cognitive Status: Within Functional Limits for tasks assessed                                        Exercises      General Comments General comments (skin integrity, edema, etc.): VSS on RA.      Pertinent Vitals/Pain Pain Assessment: No/denies pain    Home Living                      Prior Function            PT Goals (current goals can now be found in the care plan section) Acute Rehab PT Goals Patient Stated Goal: Get better and go home Progress towards PT goals: Progressing toward goals    Frequency    Min 3X/week  PT Plan Current plan remains appropriate    Co-evaluation              AM-PAC PT "6 Clicks" Mobility   Outcome Measure  Help needed turning from your back to your side while in a flat bed without using bedrails?: None Help needed moving from lying on your back to sitting on the side of a flat bed without using bedrails?: None Help needed moving to and from a bed to a chair (including a wheelchair)?: None Help needed standing up from a chair using your arms (e.g., wheelchair or bedside chair)?: None Help needed to walk in hospital  room?: A Little Help needed climbing 3-5 steps with a railing? : A Little 6 Click Score: 22    End of Session Equipment Utilized During Treatment: Gait belt Activity Tolerance: Patient tolerated treatment well Patient left: in chair;with call bell/phone within reach Nurse Communication: Mobility status PT Visit Diagnosis: Unsteadiness on feet (R26.81);History of falling (Z91.81);Hemiplegia and hemiparesis Hemiplegia - Right/Left: Left Hemiplegia - dominant/non-dominant: Non-dominant Hemiplegia - caused by: Cerebral infarction     Time: 0950-1001 PT Time Calculation (min) (ACUTE ONLY): 11 min  Charges:  $Therapeutic Activity: 8-22 mins                     Acute Rehab  Pager: [phone]    Garwin Brothers, SPT  10/21/2020, 1:53 PM

## 2020-10-21 NOTE — Progress Notes (Signed)
Change ddevice settings for MRI to  OVO per orders Tachy-therapies to off  Will Program device back to pre-MRI settings after completion of exam, and send transmission.

## 2020-10-21 NOTE — Progress Notes (Signed)
[facility] PROGRESS NOTE    Progress Note  Erin [name]  SVX:[id number] DOB: May 26, 1960 DOA: 10/19/2020 PCP: [name], MD     Brief Narrative:   Erin [name] is an 60 y.o. female past medical history of anxiety, brain aneurysm status post repair, chronic heart failure, coronary disease, with ongoing tobacco abuse of a pack a day, 3 glasses of wine per week comes into the ED with chief complaint of left-sided numbness and weakness, the patient is employed at the [employer].  She would like that it started around June 11 with some left-sided numbness which eventually caused her to fall at that she did not hit her head or lose consciousness.   Assessment/Plan:   CVA (cerebral vascular accident) [name]): CT of the head showed no masses or hemorrhage, no acute infarcts. CTA showed 70% stenosis of the proximal left internal carotid artery due to mixed density arthrosclerosis, with a patent left terminus stent. MRI of the brain showed remote left cortical/subcortical parietal occipital infarct. HgbA1c 6, fasting lipid panel HDL less than 40 LDL of 7 triglycerides 270 statin. PT, OT recommended outpatient PT. Speech evaluated the patient no recs for aspiration a diet was given. Transthoracic Echo, done on 03/20/2020 showed an EF of 30%, the left ventricular was moderately dilatedwith grade 2 diastolic dysfunction Continue ASA 81mg  daily and plavix 75mg  daily  Atorvastatin  BP goal: permissive HTN upto 220/120 mmHg No events on telemetry.  Diabetes mellitus type 2: A1c is 8.6 which is an improvement from the last time continue current regimen blood glucose well controlled.  GERD: Continue Protonix  Ischemic cardiomyopathy/chronic systolic heart failure: With a recently done 2D echo that showed an EF of 23% grade 2 diastolic dysfunction valves were grossly normal. Continue statin Coreg and Aldactone.  Continue to hold Lasix. Continue to hold his Entresto allow permissive  hypertension can be resumed as an outpatient in 2 to 3 days.  Left upper lobe groundglass nodule: Seen on CT measuring 7 mm previously 6 mm will need a 6 to 12 months follow-up every 2 years until 5 years of stability.   DVT prophylaxis: lovenox Family Communication:none at bedside Status is: Observation  The patient will require care spanning > 2 midnights and should be moved to inpatient because: Hemodynamically unstable  Dispo: The patient is from: Home              Anticipated d/c is to: Home              Patient currently is not medically stable to d/c.   Difficult to place patient No  Code Status:     Code Status Orders  (From admission, onward)           Start     Ordered   10/19/20 2138  Full code  Continuous        10/19/20 2138           Code Status History     Date Active Date Inactive Code Status Order ID Comments User Context   04/03/2020 1614 04/04/2020 0025 Full Code [id number]  Constance Haw, MD Inpatient   12/10/2019 1420 12/11/2019 2205 Full Code [id number]  Orson Eva, MD ED   08/19/2019 0854 08/19/2019 1815 Full Code [id number]  Larey Dresser, MD Inpatient   03/04/2018 1118 03/05/2018 2025 Full Code [id number]  Rodena Goldmann, DO Inpatient   07/31/2017 2214 07/31/2017 2214 Full Code [id number]  Reubin Milan, MD ED   07/31/2017  2214 07/31/2017 2214 Full Code [id number]  Reubin Milan, MD ED   03/19/2017 0456 03/19/2017 1821 Full Code [id number]  Tacey Ruiz, MD ED   11/19/2016 1559 11/21/2016 2002 Full Code [id number]  Kathie Dike, MD Inpatient   10/11/2016 0542 10/11/2016 1556 Full Code [id number]  Oswald Hillock, MD Inpatient   08/04/2016 0554 08/05/2016 1806 Full Code [id number]  Orvan Falconer, MD Inpatient   06/20/2016 0636 06/20/2016 2002 Full Code [id number]  Oswald Hillock, MD Inpatient   05/16/2016 1257 05/18/2016 1415 Full Code [id number]  Rosita Fire, MD ED   03/24/2016 1714 03/26/2016 1554 Full Code [id number]  Orvan Falconer, MD Inpatient    03/17/2016 2341 03/18/2016 1938 Full Code [id number]  Orvan Falconer, MD Inpatient   03/11/2016 1849 03/15/2016 1512 Full Code [id number]  Leonie Man, MD Inpatient   09/05/2015 2330 09/06/2015 2117 Full Code [id number]  Wandra Mannan Inpatient         IV Access:   Peripheral IV   Procedures and diagnostic studies:   CT Angio Head W or Wo Contrast  Result Date: 10/19/2020 CLINICAL DATA:  Left-sided numbness and fall EXAM: CT ANGIOGRAPHY HEAD AND NECK TECHNIQUE: Multidetector CT imaging of the head and neck was performed using the standard protocol during bolus administration of intravenous contrast. Multiplanar CT image reconstructions and MIPs were obtained to evaluate the vascular anatomy. Carotid stenosis measurements (when applicable) are obtained utilizing NASCET criteria, using the distal internal carotid diameter as the denominator. CONTRAST:  68mL OMNIPAQUE IOHEXOL 350 MG/ML SOLN, 48mL OMNIPAQUE IOHEXOL 350 MG/ML SOLN, 28mL OMNIPAQUE IOHEXOL 350 MG/ML SOLN COMPARISON:  12/10/2019 FINDINGS: CT HEAD FINDINGS Brain: There is no mass, hemorrhage or extra-axial collection. The size and configuration of the ventricles and extra-axial CSF spaces are normal. There is no acute or chronic infarction. The brain parenchyma is normal. Skull: The visualized skull base, calvarium and extracranial soft tissues are normal. Sinuses/Orbits: No fluid levels or advanced mucosal thickening of the visualized paranasal sinuses. No mastoid or middle ear effusion. The orbits are normal. CTA NECK FINDINGS SKELETON: There is no bony spinal canal stenosis. No lytic or blastic lesion. OTHER NECK: Normal pharynx, larynx and major salivary glands. No cervical lymphadenopathy. Unremarkable thyroid gland. UPPER CHEST: 7 mm ground-glass nodule in the left lung apex previously 6 mm (8:25). AORTIC ARCH: There is calcific atherosclerosis of the aortic arch. There is no aneurysm, dissection or hemodynamically significant stenosis of the  visualized portion of the aorta. Normal variant aortic arch branching pattern with the brachiocephalic and left common carotid arteries sharing a common origin. The visualized proximal subclavian arteries are widely patent. RIGHT CAROTID SYSTEM: No dissection, occlusion or aneurysm. There is mixed density atherosclerosis extending into the proximal ICA, resulting in less than 50% stenosis. LEFT CAROTID SYSTEM: No dissection, occlusion or aneurysm. There is mixed density atherosclerosis extending into the proximal ICA, resulting in approximately 70% stenosis. VERTEBRAL ARTERIES: Left dominant configuration. Both origins are clearly patent. There is no dissection, occlusion or flow-limiting stenosis to the skull base (V1-V3 segments). CTA HEAD FINDINGS POSTERIOR CIRCULATION: --Vertebral arteries: Normal V4 segments. --Inferior cerebellar arteries: Normal. --Basilar artery: Normal. --Superior cerebellar arteries: Normal. --Posterior cerebral arteries (PCA): Normal. ANTERIOR CIRCULATION: --Intracranial internal carotid arteries: Left ICA terminus stent is patent. No aneurysm filling. --Anterior cerebral arteries (ACA): Normal. Both A1 segments are present. Patent anterior communicating artery (a-comm). --Middle cerebral arteries (MCA): Normal. VENOUS SINUSES: As permitted by contrast timing, patent. ANATOMIC VARIANTS: None Review of the MIP images confirms the  above findings. IMPRESSION: 1. Patent left ICA terminus stent without aneurysm filling. 2. Approximately 70% stenosis of the proximal left internal carotid artery due to mixed density atherosclerosis. 3. Slight increase in size of left upper lobe ground-glass nodule, now measuring 7 mm, previously 6 mm. Initial follow-up with CT at 6-12 months is recommended to confirm persistence. If persistent, repeat CT is recommended every 2 years until 5 years of stability has been established. This recommendation follows the consensus statement: Guidelines for Management of  Incidental Pulmonary Nodules Detected on CT Images: From the Fleischner Society 2017; Radiology 2017; 284:228-243. Aortic Atherosclerosis (ICD10-I70.0). Electronically Signed   By: Ulyses Jarred M.D.   On: 10/19/2020 19:41   CT Angio Neck W and/or Wo Contrast  Result Date: 10/19/2020 CLINICAL DATA:  Left-sided numbness and fall EXAM: CT ANGIOGRAPHY HEAD AND NECK TECHNIQUE: Multidetector CT imaging of the head and neck was performed using the standard protocol during bolus administration of intravenous contrast. Multiplanar CT image reconstructions and MIPs were obtained to evaluate the vascular anatomy. Carotid stenosis measurements (when applicable) are obtained utilizing NASCET criteria, using the distal internal carotid diameter as the denominator. CONTRAST:  74mL OMNIPAQUE IOHEXOL 350 MG/ML SOLN, 94mL OMNIPAQUE IOHEXOL 350 MG/ML SOLN, 27mL OMNIPAQUE IOHEXOL 350 MG/ML SOLN COMPARISON:  12/10/2019 FINDINGS: CT HEAD FINDINGS Brain: There is no mass, hemorrhage or extra-axial collection. The size and configuration of the ventricles and extra-axial CSF spaces are normal. There is no acute or chronic infarction. The brain parenchyma is normal. Skull: The visualized skull base, calvarium and extracranial soft tissues are normal. Sinuses/Orbits: No fluid levels or advanced mucosal thickening of the visualized paranasal sinuses. No mastoid or middle ear effusion. The orbits are normal. CTA NECK FINDINGS SKELETON: There is no bony spinal canal stenosis. No lytic or blastic lesion. OTHER NECK: Normal pharynx, larynx and major salivary glands. No cervical lymphadenopathy. Unremarkable thyroid gland. UPPER CHEST: 7 mm ground-glass nodule in the left lung apex previously 6 mm (8:25). AORTIC ARCH: There is calcific atherosclerosis of the aortic arch. There is no aneurysm, dissection or hemodynamically significant stenosis of the visualized portion of the aorta. Normal variant aortic arch branching pattern with the  brachiocephalic and left common carotid arteries sharing a common origin. The visualized proximal subclavian arteries are widely patent. RIGHT CAROTID SYSTEM: No dissection, occlusion or aneurysm. There is mixed density atherosclerosis extending into the proximal ICA, resulting in less than 50% stenosis. LEFT CAROTID SYSTEM: No dissection, occlusion or aneurysm. There is mixed density atherosclerosis extending into the proximal ICA, resulting in approximately 70% stenosis. VERTEBRAL ARTERIES: Left dominant configuration. Both origins are clearly patent. There is no dissection, occlusion or flow-limiting stenosis to the skull base (V1-V3 segments). CTA HEAD FINDINGS POSTERIOR CIRCULATION: --Vertebral arteries: Normal V4 segments. --Inferior cerebellar arteries: Normal. --Basilar artery: Normal. --Superior cerebellar arteries: Normal. --Posterior cerebral arteries (PCA): Normal. ANTERIOR CIRCULATION: --Intracranial internal carotid arteries: Left ICA terminus stent is patent. No aneurysm filling. --Anterior cerebral arteries (ACA): Normal. Both A1 segments are present. Patent anterior communicating artery (a-comm). --Middle cerebral arteries (MCA): Normal. VENOUS SINUSES: As permitted by contrast timing, patent. ANATOMIC VARIANTS: None Review of the MIP images confirms the above findings. IMPRESSION: 1. Patent left ICA terminus stent without aneurysm filling. 2. Approximately 70% stenosis of the proximal left internal carotid artery due to mixed density atherosclerosis. 3. Slight increase in size of left upper lobe ground-glass nodule, now measuring 7 mm, previously 6 mm. Initial follow-up with CT at 6-12 months is recommended to confirm persistence.  If persistent, repeat CT is recommended every 2 years until 5 years of stability has been established. This recommendation follows the consensus statement: Guidelines for Management of Incidental Pulmonary Nodules Detected on CT Images: From the Fleischner Society 2017;  Radiology 2017; 284:228-243. Aortic Atherosclerosis (ICD10-I70.0). Electronically Signed   By: Ulyses Jarred M.D.   On: 10/19/2020 19:41   MR BRAIN WO CONTRAST  Result Date: 10/20/2020 CLINICAL DATA:  Neuro deficit, acute stroke suspected. Left-sided weakness. EXAM: MRI HEAD WITHOUT CONTRAST TECHNIQUE: Multiplanar, multiecho pulse sequences of the brain and surrounding structures were obtained without intravenous contrast. COMPARISON:  CT hand from October 19, 2020.  MRI December 10, 2019. FINDINGS: Brain: No acute infarction, hemorrhage, hydrocephalus, extra-axial collection or mass lesion. Similar sequela of prior left cortical/subcortical parieto-occipital infarction. A few other small T2/FLAIR hyperintensities within the white matter are similar to prior and most likely related to chronic microvascular ischemic disease. Similar punctate focus of severe artifact within the left parietal lobe, potentially a prior microhemorrhage. Vascular: Major arterial flow voids are maintained at the skull base. Skull and upper cervical spine: Normal marrow signal. Sinuses/Orbits: Sinuses are largely clear. Other: Trace left mastoid effusion IMPRESSION: 1. No acute intracranial abnormality. 2. Remote left cortical/subcortical parieto-occipital infarct. Electronically Signed   By: Margaretha Sheffield MD   On: 10/20/2020 12:10     Medical Consultants:   None.   Subjective:    Erin [name] no new complaints today feeling good.  Objective:    Vitals:   10/20/20 1931 10/21/20 0103 10/21/20 0351 10/21/20 0826  BP: 106/70 (!) 84/55 92/65 121/73  Pulse: 70 87 79 77  Resp: 17 17 16 18   Temp: 98.3 F (36.8 C) 98.1 F (36.7 C) 98.2 F (36.8 C) 98.6 F (37 C)  TempSrc: Oral Oral Oral Oral  SpO2: 100% 98% 97% 99%  Weight:      Height:       SpO2: 99 %  No intake or output data in the 24 hours ending 10/21/20 0908  Filed Weights   10/19/20 1741  Weight: 52.6 kg    Exam: General exam: In no acute  distress. Respiratory system: Good air movement and clear to auscultation. Cardiovascular system: S1 & S2 heard, RRR. No JVD. Gastrointestinal system: Abdomen is nondistended, soft and nontender.  Extremities: No pedal edema. Skin: No rashes, lesions or ulcers Psychiatry: Judgement and insight appear normal. Mood & affect appropriate.   Data Reviewed:    Labs: Basic Metabolic Panel: Recent Labs  Lab 10/19/20 1752 10/20/20 0508  NA 136  --   K 4.0  --   CL 101  --   CO2 25  --   GLUCOSE 92  --   BUN 21*  --   CREATININE 1.22* 0.92  CALCIUM 9.1  --     GFR Estimated Creatinine Clearance: 47.9 mL/min (by C-G formula based on SCr of 0.92 mg/dL). Liver Function Tests: Recent Labs  Lab 10/19/20 1752  AST 27  ALT 22  ALKPHOS 71  BILITOT 1.1  PROT 7.2  ALBUMIN 4.2    No results for input(s): LIPASE, AMYLASE in the last 168 hours. No results for input(s): AMMONIA in the last 168 hours. Coagulation profile Recent Labs  Lab 10/19/20 1752  INR 0.9    COVID-19 Labs  No results for input(s): DDIMER, FERRITIN, LDH, CRP in the last 72 hours.  Lab Results  Component Value Date   SARSCOV2NAA NEGATIVE 10/19/2020   SARSCOV2NAA NEGATIVE 06/27/2020   SARSCOV2NAA NEGATIVE  04/01/2020   [place] NEGATIVE 03/17/2020    CBC: Recent Labs  Lab 10/19/20 1752  WBC 8.6  NEUTROABS 5.1  HGB 12.8  HCT 38.7  MCV 89.2  PLT 400    Cardiac Enzymes: No results for input(s): CKTOTAL, CKMB, CKMBINDEX, TROPONINI in the last 168 hours. BNP (last 3 results) No results for input(s): PROBNP in the last 8760 hours. CBG: Recent Labs  Lab 10/19/20 2246 10/20/20 0912 10/20/20 1304 10/20/20 1547  GLUCAP 77 97 113* 117*    D-Dimer: No results for input(s): DDIMER in the last 72 hours. Hgb A1c: Recent Labs    10/20/20 0508  HGBA1C 5.6   Lipid Profile: Recent Labs    10/20/20 0508  CHOL 99  HDL 36*  LDLCALC 7  TRIG 278*  CHOLHDL 2.8   Thyroid function  studies: No results for input(s): TSH, T4TOTAL, T3FREE, THYROIDAB in the last 72 hours.  Invalid input(s): FREET3 Anemia work up: No results for input(s): VITAMINB12, FOLATE, FERRITIN, TIBC, IRON, RETICCTPCT in the last 72 hours. Sepsis Labs: Recent Labs  Lab 10/19/20 1752  WBC 8.6    Microbiology Recent Results (from the past 240 hour(s))  Resp Panel by RT-PCR (Flu A&B, Covid) Nasopharyngeal Swab     Status: None   Collection Time: 10/19/20  6:01 PM   Specimen: Nasopharyngeal Swab; Nasopharyngeal(NP) swabs in vial transport medium  Result Value Ref Range Status   SARS Coronavirus 2 by RT PCR NEGATIVE NEGATIVE Final    Comment: (NOTE) SARS-CoV-2 target nucleic acids are NOT DETECTED.  The SARS-CoV-2 RNA is generally detectable in upper respiratory specimens during the acute phase of infection. The lowest concentration of SARS-CoV-2 viral copies this assay can detect is 138 copies/mL. A negative result does not preclude SARS-Cov-2 infection and should not be used as the sole basis for treatment or other patient management decisions. A negative result may occur with  improper specimen collection/handling, submission of specimen other than nasopharyngeal swab, presence of viral mutation(s) within the areas targeted by this assay, and inadequate number of viral copies(<138 copies/mL). A negative result must be combined with clinical observations, patient history, and epidemiological information. The expected result is Negative.  Fact Sheet for Patients:  EntrepreneurPulse.com.au  Fact Sheet for Healthcare Providers:  IncredibleEmployment.be  This test is no t yet approved or cleared by the [place] FDA and  has been authorized for detection and/or diagnosis of SARS-CoV-2 by FDA under an Emergency Use Authorization (EUA). This EUA will remain  in effect (meaning this test can be used) for the duration of the COVID-19 declaration under  Section 564(b)(1) of the Act, 21 U.S.C.section 360bbb-3(b)(1), unless the authorization is terminated  or revoked sooner.       Influenza A by PCR NEGATIVE NEGATIVE Final   Influenza B by PCR NEGATIVE NEGATIVE Final    Comment: (NOTE) The Xpert Xpress SARS-CoV-2/FLU/RSV plus assay is intended as an aid in the diagnosis of influenza from Nasopharyngeal swab specimens and should not be used as a sole basis for treatment. Nasal washings and aspirates are unacceptable for Xpert Xpress SARS-CoV-2/FLU/RSV testing.  Fact Sheet for Patients: EntrepreneurPulse.com.au  Fact Sheet for Healthcare Providers: IncredibleEmployment.be  This test is not yet approved or cleared by the [place] FDA and has been authorized for detection and/or diagnosis of SARS-CoV-2 by FDA under an Emergency Use Authorization (EUA). This EUA will remain in effect (meaning this test can be used) for the duration of the COVID-19 declaration under Section 564(b)(1) of the Act,  21 U.S.C. section 360bbb-3(b)(1), unless the authorization is terminated or revoked.  Performed at [facility], [address] [place], [place] [address]      Medications:     stroke: mapping our early stages of recovery book   Does not apply Once   aspirin EC  81 mg Oral QPM   atorvastatin  80 mg Oral Daily   carvedilol  3.125 mg Oral BID   clopidogrel  75 mg Oral Daily   enoxaparin (LOVENOX) injection  40 mg Subcutaneous Q24H   ezetimibe  10 mg Oral Daily   furosemide  20 mg Oral Daily   gabapentin  200 mg Oral QHS   LORazepam  1 mg Oral QHS   pantoprazole  40 mg Oral Daily   rOPINIRole  0.5 mg Oral QHS   spironolactone  12.5 mg Oral Daily   Continuous Infusions:  sodium chloride 10 mL/hr at 10/20/20 1611      LOS: 1 day   Charlynne Cousins  [facility]  10/21/2020, 9:08 AM

## 2020-10-21 NOTE — Progress Notes (Signed)
Occupational Therapy Treatment Patient Details Name: Erin [name] MRN: [id number] DOB: Mar 01, 1961 Today's Date: 10/21/2020    History of present illness [name] is an 60 y.o. female admitted 10/19/20 with left-sided numbness and weakness (started 10/17/20) which caused a fall. CT negative for acute abnormality, MRI showed remote left parietoccipital infarct. PMH includes: anxiety, brain aneurysm status post repair, chronic heart failure, coronary disease, with ongoing tobacco abuse of a pack a day   OT comments  Pt making steady progress towards OT goals this session. Pt received supine in bed agreeable to OT intervention. Pt present with mild FMC deficits but overall functioning at supervision level. Pt completed functional mobility greater than a household distance with no AD and gross supervision. Pt completed UB/ LB ADLs from EOB with set- up assist. Pt flat during session but AxOx4 and participatory in session, pt hopeful she can return home today with her husband and 8 children. Pt would continue to benefit from skilled occupational therapy while admitted and after d/c to address the below listed limitations in order to improve overall functional mobility and facilitate independence with BADL participation. DC plan remains appropriate, will follow acutely per POC.      Follow Up Recommendations  Outpatient OT;Supervision/Assistance - 24 hour;Other (comment) (initially)    Equipment Recommendations  Tub/shower seat    Recommendations for Other Services      Precautions / Restrictions Precautions Precautions: Fall Precaution Comments: pt denies numbness this session 6/15 Restrictions Weight Bearing Restrictions: No       Mobility Bed Mobility Overal bed mobility: Modified Independent             General bed mobility comments: no physical assist needed    Transfers Overall transfer level: Needs assistance Equipment used: None Transfers: Sit to/from Stand Sit  to Stand: Supervision         General transfer comment: supervision for safety    Balance Overall balance assessment: Needs assistance Sitting-balance support: No upper extremity supported Sitting balance-Leahy Scale: Good     Standing balance support: No upper extremity supported;Single extremity supported;During functional activity Standing balance-Leahy Scale: Fair                             ADL either performed or assessed with clinical judgement   ADL Overall ADL's : Needs assistance/impaired                 Upper Body Dressing : Supervision/safety;Sitting;Set up Upper Body Dressing Details (indicate cue type and reason): to don extra gown Lower Body Dressing: Supervision/safety;Sitting/lateral leans Lower Body Dressing Details (indicate cue type and reason): able to perform figure 4 with both legs to don socks Toilet Transfer: Min Publishing copy Details (indicate cue type and reason): simulated via functional mobility         Functional mobility during ADLs: Min guard;Supervision/safety General ADL Comments: pt completing functional mobility, UB/LB ADLS and provided instruction on [facility] therex and theraputty exercies     Vision Baseline Vision/History: Wears glasses Wears Glasses: At all times Patient Visual Report: No change from baseline     Perception     Praxis      Cognition Arousal/Alertness: Awake/alert Behavior During Therapy: WFL for tasks assessed/performed;Flat affect Overall Cognitive Status: No family/caregiver present to determine baseline cognitive functioning  General Comments: very flat but ovearall [facility]        Exercises Hand Exercises Digit Composite Flexion: Strengthening;Both;5 reps Composite Extension: Strengthening;Both;5 reps Digit Composite Abduction: Strengthening;Both;5 reps;Seated Digit Composite Adduction: Strengthening;Both;5  reps;Seated Digit Lifts: Strengthening;Both;Seated;5 reps Opposition: AROM;Both;5 reps;Seated Other Exercises Other Exercises: issued pt HEP for [facility] therex as well as level 1 theraputty exercises Other Exercises: theraputty exercies included digit composite flexion/extension, pincer grasg, MP flexion/ extension and digit ADD/ ABD   Shoulder Instructions       General Comments     Pertinent Vitals/ Pain       Pain Assessment: No/denies pain  Home Living                                          Prior Functioning/Environment              Frequency  Min 2X/week        Progress Toward Goals  OT Goals(current goals can now be found in the care plan section)  Progress towards OT goals: Progressing toward goals  Acute Rehab OT Goals Patient Stated Goal: to go home today OT Goal Formulation: With patient Time For Goal Achievement: 11/03/20 Potential to Achieve Goals: Good  Plan Discharge plan remains appropriate;Frequency remains appropriate    Co-evaluation                 AM-PAC OT "6 Clicks" Daily Activity     Outcome Measure   Help from another person eating meals?: None Help from another person taking care of personal grooming?: A Little Help from another person toileting, which includes using toliet, bedpan, or urinal?: A Little Help from another person bathing (including washing, rinsing, drying)?: A Little Help from another person to put on and taking off regular upper body clothing?: None Help from another person to put on and taking off regular lower body clothing?: None 6 Click Score: 21    End of Session Equipment Utilized During Treatment: Gait belt  OT Visit Diagnosis: Unsteadiness on feet (R26.81);Other abnormalities of gait and mobility (R26.89);Muscle weakness (generalized) (M62.81);Other symptoms and signs involving cognitive function;Other symptoms and signs involving the nervous system (R29.898);Hemiplegia and  hemiparesis Hemiplegia - Right/Left: Left Hemiplegia - dominant/non-dominant: Non-Dominant Hemiplegia - caused by: Unspecified   Activity Tolerance Patient tolerated treatment well   Patient Left in chair;with call bell/phone within reach   Nurse Communication Mobility status        Time: 2376-2831 OT Time Calculation (min): 13 min  Charges: OT General Charges $OT Visit: 1 Visit OT Treatments $Self Care/Home Management : 8-22 mins  Harley Alto., COTA/L Acute Rehabilitation Services [place]    Precious Haws 10/21/2020, 8:27 AM

## 2020-10-21 NOTE — TOC Transition Note (Signed)
Transition of Care [facility]) - CM/SW Discharge Note   Patient Details  Name: ADALENA ABDULLA MRN: [id number] Date of Birth: [birth year]  [facility]) CM/SW Contact:  Pollie Friar, RN Phone Number: 10/21/2020, 1:42 PM   Clinical Narrative:    Patient refused need for inpatient/ outpatient alcohol resources.     Barriers to Discharge: Continued Medical Work up   Patient Goals and CMS Choice   CMS Medicare.gov Compare Post Acute Care list provided to:: Patient Choice offered to / list presented to : Patient  Discharge Placement                       Discharge Plan and Services   Discharge Planning Services: CM Consult                                 Social Determinants of Health (SDOH) Interventions     Readmission Risk Interventions No flowsheet data found.

## 2020-10-21 NOTE — Plan of Care (Signed)
  Problem: Education: Goal: Knowledge of General Education information will improve Description: Including pain rating scale, medication(s)/side effects and non-pharmacologic comfort measures Outcome: Progressing   Problem: Health Behavior/Discharge Planning: Goal: Ability to manage health-related needs will improve Outcome: Progressing   Problem: Clinical Measurements: Goal: Ability to maintain clinical measurements within normal limits will improve Outcome: Progressing Goal: Will remain free from infection Outcome: Progressing Goal: Diagnostic test results will improve Outcome: Progressing Goal: Respiratory complications will improve Outcome: Progressing Goal: Cardiovascular complication will be avoided Outcome: Progressing   Problem: Activity: Goal: Risk for activity intolerance will decrease Outcome: Progressing   Problem: Nutrition: Goal: Adequate nutrition will be maintained Outcome: Progressing   Problem: Coping: Goal: Level of anxiety will decrease Outcome: Progressing   Problem: Safety: Goal: Ability to remain free from injury will improve Outcome: Progressing   Problem: Skin Integrity: Goal: Risk for impaired skin integrity will decrease Outcome: Progressing   Problem: Education: Goal: Knowledge of disease or condition will improve Outcome: Progressing Goal: Knowledge of secondary prevention will improve Outcome: Progressing Goal: Knowledge of patient specific risk factors addressed and post discharge goals established will improve Outcome: Progressing   Problem: Ischemic Stroke/TIA Tissue Perfusion: Goal: Complications of ischemic stroke/TIA will be minimized Outcome: Progressing

## 2020-10-21 NOTE — Progress Notes (Signed)
Neurology Progress Note  S: No overnight events. Patient states she feels stronger with less left arm weakness. She denies any clumsiness or dropping of items with her left hand. States her numbness and tingling to the left hand upon admission, is only in the 5th finger now, and it comes and goes. Never had a seizure. She states she is 90% better with her symptoms than when she was admitted. She has ambulated with PT.   PT note reviewed and recommendation for outpatient PT. OT with same recommendation.   O: Current vital signs: BP 121/73 (BP Location: Right Arm)   Pulse 77   Temp 98.6 F (37 C) (Oral)   Resp 18   Ht 5' (1.524 m)   Wt 52.6 kg   SpO2 99%   BMI 22.65 kg/m  Vital signs in last 24 hours: Temp:  [97.9 F (36.6 C)-98.6 F (37 C)] 98.6 F (37 C) (06/15 0826) Pulse Rate:  [64-87] 77 (06/15 0826) Resp:  [16-18] 18 (06/15 0826) BP: (84-121)/(52-73) 121/73 (06/15 0826) SpO2:  [97 %-100 %] 99 % (06/15 0826)  GENERAL: Awake, alert in NAD HEENT: Normocephalic and atraumatic LUNGS: Normal respiratory effort.  Ext: warm  NEURO:  Mental Status: AA&Ox3. Follows commands.  Speech/Language: speech is without aphasia or dysarthria.  Comprehension intact.  Cranial Nerves:  II: PERRL.  III, IV, VI: EOMI. Eyelids elevate symmetrically.  V: Sensation is intact to light touch and symmetrical to face.  VII: Smile is symmetrical with no facial drooping.    VIII: hearing intact to voice. IX, X: Palate elevates symmetrically. Phonation is normal.  VZ:DGLOVFIE shrug 5/5. XII: tongue is midline without fasciculations. Motor: 5/5 strength to all muscle groups tested except weakness noted in left 4th and 5th fingers.   Tone: is normal and bulk is normal Sensation- Intact to light touch bilaterally. Extinction absent to light touch to DSS.    Coordination: FTN intact bilaterally, No drift.  DTRs: RUE brachioradialis. 2+ LUE brachioradialis 3+. RLE patella 4+. PPI:RJJOACZ 3+ Gait-  deferred  Medications  Current Facility-Administered Medications:     stroke: mapping our early stages of recovery book, , Does not apply, Once, Zierle-Ghosh, [place] B, DO   0.9 %  sodium chloride infusion, , Intravenous, Continuous, Charlynne Cousins, MD, Last Rate: 10 mL/hr at 10/20/20 1611, New Bag at 10/20/20 1611   acetaminophen (TYLENOL) tablet 650 mg, 650 mg, Oral, Q4H PRN **OR** acetaminophen (TYLENOL) 160 MG/5ML solution 650 mg, 650 mg, Per Tube, Q4H PRN **OR** acetaminophen (TYLENOL) suppository 650 mg, 650 mg, Rectal, Q4H PRN, Zierle-Ghosh, Asia B, DO   aspirin EC tablet 81 mg, 81 mg, Oral, QPM, Zierle-Ghosh, Asia B, DO, 81 mg at 10/20/20 1610   atorvastatin (LIPITOR) tablet 80 mg, 80 mg, Oral, Daily, Zierle-Ghosh, Asia B, DO, 80 mg at 10/21/20 1007   carvedilol (COREG) tablet 3.125 mg, 3.125 mg, Oral, BID, Zierle-Ghosh, Asia B, DO, 3.125 mg at 10/21/20 1007   clopidogrel (PLAVIX) tablet 75 mg, 75 mg, Oral, Daily, Zierle-Ghosh, Asia B, DO, 75 mg at 10/21/20 1007   enoxaparin (LOVENOX) injection 40 mg, 40 mg, Subcutaneous, Q24H, Hammons, Kimberly B, RPH, 40 mg at 10/20/20 2143   ezetimibe (ZETIA) tablet 10 mg, 10 mg, Oral, Daily, Zierle-Ghosh, Asia B, DO, 10 mg at 10/21/20 1007   gabapentin (NEURONTIN) capsule 200 mg, 200 mg, Oral, QHS, Zierle-Ghosh, Asia B, DO, 200 mg at 10/20/20 2138   iohexol (OMNIPAQUE) 350 MG/ML injection 75 mL, 75 mL, Intravenous, Once PRN, Zierle-Ghosh, Asia B,  DO   LORazepam (ATIVAN) tablet 1 mg, 1 mg, Oral, QHS, Zierle-Ghosh, Asia B, DO, 1 mg at 10/20/20 2138   pantoprazole (PROTONIX) EC tablet 40 mg, 40 mg, Oral, Daily, Zierle-Ghosh, Asia B, DO, 40 mg at 10/21/20 1007   rOPINIRole (REQUIP) tablet 0.5 mg, 0.5 mg, Oral, QHS, Zierle-Ghosh, Asia B, DO, 0.5 mg at 10/20/20 2139   senna-docusate (Senokot-S) tablet 1 tablet, 1 tablet, Oral, QHS PRN, Zierle-Ghosh, Asia B, DO   spironolactone (ALDACTONE) tablet 12.5 mg, 12.5 mg, Oral, Daily, Zierle-Ghosh, Asia B, DO,  12.5 mg at 10/20/20 1344  Imaging  MRI Brain  -No acute intracranial abnormality. -Remote left cortical/subcortical parieto-occipital infarct. -Unchanged from prior MRI brain. No new stroke.   CTA head and neck summary -Right carotid without dissection or aneurysm. Proximal ICA with less than 50% stenosis. -Left carotid without dissection or aneurysm. Left ICA terminus stent without aneurysmal filling. Proximal ICA with 70% stenosis due to atherosclerosis.    Assessment: 60 yo female here for evaluation of left sided weakness. Acute CVA was ruled out by MRI brain, but there is evidence of old strokes. She has significantly improved regarding her weakness on initial presentation. The sensory changes she presented with have resolved except for left 5th finger.  - Unchanged MRI brain with no new stroke.  - Given that no explanation for her left sided weakness has been found with brain MRI, will need to obtain C-spine MRI. DDx would include late-onset demyelinating disease.  - ST signed off.  - PT/OT recommend outpatient treatment.  - TTE has been cancelled by medicine team.   Recommendations: - Continue to monitor  - MRI of cervical spine (ordered)   Pt seen by Clance Boll, MSN, APN-BC/Nurse Practitioner/Neuro and later by MD. Note and plan to be edited as needed by MD.  Pager: [phone]  Electronically signed: Dr. Kerney Elbe

## 2020-10-22 DIAGNOSIS — R531 Weakness: Secondary | ICD-10-CM

## 2020-10-22 LAB — BASIC METABOLIC PANEL
Anion gap: 6 (ref 5–15)
BUN: 11 mg/dL (ref 6–20)
CO2: 24 mmol/L (ref 22–32)
Calcium: 9.1 mg/dL (ref 8.9–10.3)
Chloride: 109 mmol/L (ref 98–111)
Creatinine, Ser: 1 mg/dL (ref 0.44–1.00)
GFR, Estimated: >60 mL/min (ref >60)
Glucose, Bld: 93 mg/dL (ref 70–99)
Potassium: 3.5 mmol/L (ref 3.5–5.1)
Sodium: 139 mmol/L (ref 135–145)

## 2020-10-22 MED ORDER — FUROSEMIDE 20 MG PO TABS
20.0000 mg | ORAL_TABLET | Freq: Every day | ORAL | Status: DC
  Administered 2020-10-22: 20 mg via ORAL
  Filled 2020-10-22: qty 1

## 2020-10-22 NOTE — Discharge Summary (Addendum)
Physician Discharge Summary  Erin [name] IRJ:[id number] DOB: [birth year] DOA: 10/19/2020  PCP: [name], MD  Admit date: 10/19/2020 Discharge date: 10/22/2020  Admitted From: Home Disposition:  Home  Recommendations for Outpatient Follow-up:  Follow up with PCP in 1-2 weeks Please obtain BMP/CBC in one week Follow-up on oh 7 mm left upper lobe groundglass nodule with a CT in 6 to 12 months.   Home Health:YEs Equipment/Devices:None  Discharge Condition:Stable CODE STATUS:Full Diet recommendation: Heart Healthy   Brief/Interim Summary: 60 y.o. female past medical history of anxiety, brain aneurysm status post repair, chronic heart failure, coronary disease, with ongoing tobacco abuse of a pack a day, 3 glasses of wine per week comes into the ED with chief complaint of left-sided numbness and weakness, the patient is employed at the [employer].  She would like that it started around June 11 with some left-sided numbness which eventually caused her to fall at that she did not hit her head or lose consciousness.  Discharge Diagnoses:  Active Problems:   Cardiomyopathy, ischemic   Chronic combined systolic and diastolic CHF (congestive heart failure) (HCC)   Left-sided weakness  Left-sided numbness and weakness: The of the head showed no masses hemorrhage or acute infarct, CTA of the head showed 70% stenosis of the proximal left internal carotid artery due to mixed density atherosclerotic. MRI of the brain showed remote infarct, MRI of the C-spine showed no cord lesion or canal stenosis facet osteoarthropathy. CVA was ruled out, she has had significant improvement in her weakness since initial presentation. Physical therapy examination the patient and recommended outpatient PT. She will continue aspirin and Plavix and statins. She has been advised not to smoke.  Diabetes mellitus type 2 no changes in medication.  GERD: Continue Protonix.  Ischemic cardiomyopathy/chronic  systolic heart failure: With a recent 2D echo showed an EF of 30%, no changes made to her medication. Continue statins, Coreg, Lasix, Aldactone and Entresto.  Left upper lobe groundglass nodule: Seen on CT scan measured 7 mm previously 6 will need follow-up every 6 to 12 months for up to 5 years of stability.  Discharge Instructions  Discharge Instructions     Ambulatory referral to Occupational Therapy   Complete by: As directed    Ambulatory referral to Physical Therapy   Complete by: As directed    Diet - low sodium heart healthy   Complete by: As directed    Increase activity slowly   Complete by: As directed       Allergies as of 10/22/2020       Reactions   Tape Other (See Comments)   PEELS SKIN OFF  (PAPER TAPE IS FINE)        Medication List     TAKE these medications    acetaminophen 325 MG tablet Commonly known as: TYLENOL Take 325 mg by mouth every 6 (six) hours as needed for mild pain or headache.   ALPRAZolam 1 MG tablet Commonly known as: XANAX Take 1-2 mg by mouth at bedtime.   aspirin EC 81 MG tablet Take 81 mg by mouth every evening.   atorvastatin 80 MG tablet Commonly known as: LIPITOR TAKE 1 TABLET BY MOUTH ONCE DAILY IN THE EVENING AT  6PM   carvedilol 6.25 MG tablet Commonly known as: COREG Take 0.5 tablets (3.125 mg total) by mouth 2 (two) times daily.   clopidogrel 75 MG tablet Commonly known as: PLAVIX TAKE 1 TABLET BY MOUTH ONCE DAILY PATIENT  NEEDS  APPOINTMENT  Entresto 97-103 MG Generic drug: sacubitril-valsartan Take 1 tablet by mouth 2 (two) times daily.   ezetimibe 10 MG tablet Commonly known as: ZETIA Take 1 tablet by mouth once daily   Farxiga 10 MG Tabs tablet Generic drug: dapagliflozin propanediol TAKE 1 TABLET BY MOUTH ONCE DAILY BEFORE BREAKFAST   furosemide 20 MG tablet Commonly known as: Lasix Take 1 tablet (20 mg total) by mouth daily.   gabapentin 100 MG capsule Commonly known as: NEURONTIN Take  200 mg by mouth at bedtime.   loperamide 2 MG capsule Commonly known as: IMODIUM Take 2 mg by mouth 4 (four) times daily as needed for diarrhea or loose stools (ibs).   LORazepam 1 MG tablet Commonly known as: ATIVAN Take 1 mg by mouth at bedtime.   multivitamin with minerals tablet Take 1 tablet by mouth daily.   nitroGLYCERIN 0.4 MG SL tablet Commonly known as: NITROSTAT Place 1 tablet (0.4 mg total) under the tongue every 5 (five) minutes x 3 doses as needed for chest pain (if no relief after 2nd dose, proceed to the ED for an evaluation or call 911).   omeprazole 20 MG capsule Commonly known as: PRILOSEC Take 20 mg by mouth daily.   rOPINIRole 0.5 MG tablet Commonly known as: REQUIP Take 0.5 mg by mouth at bedtime.   spironolactone 25 MG tablet Commonly known as: ALDACTONE Take 12.5 mg by mouth daily.   Vitamin D3 10 MCG (400 UNIT) Caps Take 1 capsule by mouth daily.   zinc gluconate 50 MG tablet Take 50 mg by mouth daily.        Follow-up Information     [facility] Follow up.   Specialty: Rehabilitation Why: The outpatient rehab will contact you for the first appointment Contact information: [place] [id number] [place] [address] [phone]               Allergies  Allergen Reactions   Tape Other (See Comments)    PEELS SKIN OFF  (PAPER TAPE IS FINE)    Consultations: Neurology   Procedures/Studies: CT Angio Head W or Wo Contrast  Result Date: 10/19/2020 CLINICAL DATA:  Left-sided numbness and fall EXAM: CT ANGIOGRAPHY HEAD AND NECK TECHNIQUE: Multidetector CT imaging of the head and neck was performed using the standard protocol during bolus administration of intravenous contrast. Multiplanar CT image reconstructions and MIPs were obtained to evaluate the vascular anatomy. Carotid stenosis measurements (when applicable) are obtained utilizing NASCET criteria, using the  distal internal carotid diameter as the denominator. CONTRAST:  59mL OMNIPAQUE IOHEXOL 350 MG/ML SOLN, 43mL OMNIPAQUE IOHEXOL 350 MG/ML SOLN, 84mL OMNIPAQUE IOHEXOL 350 MG/ML SOLN COMPARISON:  12/10/2019 FINDINGS: CT HEAD FINDINGS Brain: There is no mass, hemorrhage or extra-axial collection. The size and configuration of the ventricles and extra-axial CSF spaces are normal. There is no acute or chronic infarction. The brain parenchyma is normal. Skull: The visualized skull base, calvarium and extracranial soft tissues are normal. Sinuses/Orbits: No fluid levels or advanced mucosal thickening of the visualized paranasal sinuses. No mastoid or middle ear effusion. The orbits are normal. CTA NECK FINDINGS SKELETON: There is no bony spinal canal stenosis. No lytic or blastic lesion. OTHER NECK: Normal pharynx, larynx and major salivary glands. No cervical lymphadenopathy. Unremarkable thyroid gland. UPPER CHEST: 7 mm ground-glass nodule in the left lung apex previously 6 mm (8:25). AORTIC ARCH: There is calcific atherosclerosis of the aortic arch. There is no aneurysm, dissection or hemodynamically significant  stenosis of the visualized portion of the aorta. Normal variant aortic arch branching pattern with the brachiocephalic and left common carotid arteries sharing a common origin. The visualized proximal subclavian arteries are widely patent. RIGHT CAROTID SYSTEM: No dissection, occlusion or aneurysm. There is mixed density atherosclerosis extending into the proximal ICA, resulting in less than 50% stenosis. LEFT CAROTID SYSTEM: No dissection, occlusion or aneurysm. There is mixed density atherosclerosis extending into the proximal ICA, resulting in approximately 70% stenosis. VERTEBRAL ARTERIES: Left dominant configuration. Both origins are clearly patent. There is no dissection, occlusion or flow-limiting stenosis to the skull base (V1-V3 segments). CTA HEAD FINDINGS POSTERIOR CIRCULATION: --Vertebral arteries:  Normal V4 segments. --Inferior cerebellar arteries: Normal. --Basilar artery: Normal. --Superior cerebellar arteries: Normal. --Posterior cerebral arteries (PCA): Normal. ANTERIOR CIRCULATION: --Intracranial internal carotid arteries: Left ICA terminus stent is patent. No aneurysm filling. --Anterior cerebral arteries (ACA): Normal. Both A1 segments are present. Patent anterior communicating artery (a-comm). --Middle cerebral arteries (MCA): Normal. VENOUS SINUSES: As permitted by contrast timing, patent. ANATOMIC VARIANTS: None Review of the MIP images confirms the above findings. IMPRESSION: 1. Patent left ICA terminus stent without aneurysm filling. 2. Approximately 70% stenosis of the proximal left internal carotid artery due to mixed density atherosclerosis. 3. Slight increase in size of left upper lobe ground-glass nodule, now measuring 7 mm, previously 6 mm. Initial follow-up with CT at 6-12 months is recommended to confirm persistence. If persistent, repeat CT is recommended every 2 years until 5 years of stability has been established. This recommendation follows the consensus statement: Guidelines for Management of Incidental Pulmonary Nodules Detected on CT Images: From the Fleischner Society 2017; Radiology 2017; 284:228-243. Aortic Atherosclerosis (ICD10-I70.0). Electronically Signed   By: Ulyses Jarred M.D.   On: 10/19/2020 19:41   CT Angio Neck W and/or Wo Contrast  Result Date: 10/19/2020 CLINICAL DATA:  Left-sided numbness and fall EXAM: CT ANGIOGRAPHY HEAD AND NECK TECHNIQUE: Multidetector CT imaging of the head and neck was performed using the standard protocol during bolus administration of intravenous contrast. Multiplanar CT image reconstructions and MIPs were obtained to evaluate the vascular anatomy. Carotid stenosis measurements (when applicable) are obtained utilizing NASCET criteria, using the distal internal carotid diameter as the denominator. CONTRAST:  38mL OMNIPAQUE IOHEXOL 350  MG/ML SOLN, 32mL OMNIPAQUE IOHEXOL 350 MG/ML SOLN, 15mL OMNIPAQUE IOHEXOL 350 MG/ML SOLN COMPARISON:  12/10/2019 FINDINGS: CT HEAD FINDINGS Brain: There is no mass, hemorrhage or extra-axial collection. The size and configuration of the ventricles and extra-axial CSF spaces are normal. There is no acute or chronic infarction. The brain parenchyma is normal. Skull: The visualized skull base, calvarium and extracranial soft tissues are normal. Sinuses/Orbits: No fluid levels or advanced mucosal thickening of the visualized paranasal sinuses. No mastoid or middle ear effusion. The orbits are normal. CTA NECK FINDINGS SKELETON: There is no bony spinal canal stenosis. No lytic or blastic lesion. OTHER NECK: Normal pharynx, larynx and major salivary glands. No cervical lymphadenopathy. Unremarkable thyroid gland. UPPER CHEST: 7 mm ground-glass nodule in the left lung apex previously 6 mm (8:25). AORTIC ARCH: There is calcific atherosclerosis of the aortic arch. There is no aneurysm, dissection or hemodynamically significant stenosis of the visualized portion of the aorta. Normal variant aortic arch branching pattern with the brachiocephalic and left common carotid arteries sharing a common origin. The visualized proximal subclavian arteries are widely patent. RIGHT CAROTID SYSTEM: No dissection, occlusion or aneurysm. There is mixed density atherosclerosis extending into the proximal ICA, resulting in less than 50% stenosis.  LEFT CAROTID SYSTEM: No dissection, occlusion or aneurysm. There is mixed density atherosclerosis extending into the proximal ICA, resulting in approximately 70% stenosis. VERTEBRAL ARTERIES: Left dominant configuration. Both origins are clearly patent. There is no dissection, occlusion or flow-limiting stenosis to the skull base (V1-V3 segments). CTA HEAD FINDINGS POSTERIOR CIRCULATION: --Vertebral arteries: Normal V4 segments. --Inferior cerebellar arteries: Normal. --Basilar artery: Normal.  --Superior cerebellar arteries: Normal. --Posterior cerebral arteries (PCA): Normal. ANTERIOR CIRCULATION: --Intracranial internal carotid arteries: Left ICA terminus stent is patent. No aneurysm filling. --Anterior cerebral arteries (ACA): Normal. Both A1 segments are present. Patent anterior communicating artery (a-comm). --Middle cerebral arteries (MCA): Normal. VENOUS SINUSES: As permitted by contrast timing, patent. ANATOMIC VARIANTS: None Review of the MIP images confirms the above findings. IMPRESSION: 1. Patent left ICA terminus stent without aneurysm filling. 2. Approximately 70% stenosis of the proximal left internal carotid artery due to mixed density atherosclerosis. 3. Slight increase in size of left upper lobe ground-glass nodule, now measuring 7 mm, previously 6 mm. Initial follow-up with CT at 6-12 months is recommended to confirm persistence. If persistent, repeat CT is recommended every 2 years until 5 years of stability has been established. This recommendation follows the consensus statement: Guidelines for Management of Incidental Pulmonary Nodules Detected on CT Images: From the Fleischner Society 2017; Radiology 2017; 284:228-243. Aortic Atherosclerosis (ICD10-I70.0). Electronically Signed   By: Ulyses Jarred M.D.   On: 10/19/2020 19:41   MR BRAIN WO CONTRAST  Result Date: 10/20/2020 CLINICAL DATA:  Neuro deficit, acute stroke suspected. Left-sided weakness. EXAM: MRI HEAD WITHOUT CONTRAST TECHNIQUE: Multiplanar, multiecho pulse sequences of the brain and surrounding structures were obtained without intravenous contrast. COMPARISON:  CT hand from October 19, 2020.  MRI December 10, 2019. FINDINGS: Brain: No acute infarction, hemorrhage, hydrocephalus, extra-axial collection or mass lesion. Similar sequela of prior left cortical/subcortical parieto-occipital infarction. A few other small T2/FLAIR hyperintensities within the white matter are similar to prior and most likely related to chronic  microvascular ischemic disease. Similar punctate focus of severe artifact within the left parietal lobe, potentially a prior microhemorrhage. Vascular: Major arterial flow voids are maintained at the skull base. Skull and upper cervical spine: Normal marrow signal. Sinuses/Orbits: Sinuses are largely clear. Other: Trace left mastoid effusion IMPRESSION: 1. No acute intracranial abnormality. 2. Remote left cortical/subcortical parieto-occipital infarct. Electronically Signed   By: Margaretha Sheffield MD   On: 10/20/2020 12:10   MR CERVICAL SPINE WO CONTRAST  Result Date: 10/21/2020 CLINICAL DATA:  Left-sided numbness and weakness EXAM: MRI CERVICAL SPINE WITHOUT CONTRAST TECHNIQUE: Multiplanar, multisequence MR imaging of the cervical spine was performed. No intravenous contrast was administered. COMPARISON:  None. FINDINGS: Alignment: Normal Vertebrae: Normal Cord: No cord compression or primary cord lesion. Posterior Fossa, vertebral arteries, paraspinal tissues: Negative Disc levels: No abnormality from the foramen magnum through C4-5. Wide patency of the canal and foramina. At C5-6, there is no disc abnormality. There is facet osteoarthritis on the left with a joint effusion. This could be a cause of left-sided neck pain. There is only mild foraminal encroachment on the left, not likely compressive. C6-7: Minimal disc bulge and facet hypertrophy. No compressive canal or foraminal narrowing. C7-T1 in the upper thoracic region: Normal. IMPRESSION: No cord lesion.  No canal stenosis. Facet osteoarthritis on the left at C5-6 with a joint effusion. This could be a cause of left neck pain. Mild foraminal narrowing on the left without apparent compression of the exiting left C6 nerve. Minimal disc bulge and facet hypertrophy  at C6-7 without stenosis. Electronically Signed   By: Nelson Chimes M.D.   On: 10/21/2020 15:23   CUP PACEART REMOTE DEVICE CHECK  Result Date: 10/07/2020 Scheduled remote reviewed. Normal  device function.  Next remote 91 days. HB  (Echo, Carotid, EGD, Colonoscopy, ERCP)    Subjective: No complaints  Discharge Exam: Vitals:   10/22/20 0331 10/22/20 0817  BP: 105/63 122/78  Pulse: 75 66  Resp: 18   Temp: 98.1 F (36.7 C) 98 F (36.7 C)  SpO2: 99% 98%   Vitals:   10/21/20 1958 10/21/20 2313 10/22/20 0331 10/22/20 0817  BP: 114/77 112/78 105/63 122/78  Pulse: 72 68 75 66  Resp: 18 17 18    Temp: 98.2 F (36.8 C) 97.8 F (36.6 C) 98.1 F (36.7 C) 98 F (36.7 C)  TempSrc: Oral Oral Oral Oral  SpO2: 99% 100% 99% 98%  Weight:      Height:        General: Pt is alert, awake, not in acute distress Cardiovascular: RRR, S1/S2 +, no rubs, no gallops Respiratory: CTA bilaterally, no wheezing, no rhonchi Abdominal: Soft, NT, ND, bowel sounds + Extremities: no edema, no cyanosis    The results of significant diagnostics from this hospitalization (including imaging, microbiology, ancillary and laboratory) are listed below for reference.     Microbiology: Recent Results (from the past 240 hour(s))  Resp Panel by RT-PCR (Flu A&B, Covid) Nasopharyngeal Swab     Status: None   Collection Time: 10/19/20  6:01 PM   Specimen: Nasopharyngeal Swab; Nasopharyngeal(NP) swabs in vial transport medium  Result Value Ref Range Status   SARS Coronavirus 2 by RT PCR NEGATIVE NEGATIVE Final    Comment: (NOTE) SARS-CoV-2 target nucleic acids are NOT DETECTED.  The SARS-CoV-2 RNA is generally detectable in upper respiratory specimens during the acute phase of infection. The lowest concentration of SARS-CoV-2 viral copies this assay can detect is 138 copies/mL. A negative result does not preclude SARS-Cov-2 infection and should not be used as the sole basis for treatment or other patient management decisions. A negative result may occur with  improper specimen collection/handling, submission of specimen other than nasopharyngeal swab, presence of viral mutation(s) within  the areas targeted by this assay, and inadequate number of viral copies(<138 copies/mL). A negative result must be combined with clinical observations, patient history, and epidemiological information. The expected result is Negative.  Fact Sheet for Patients:  EntrepreneurPulse.com.au  Fact Sheet for Healthcare Providers:  IncredibleEmployment.be  This test is no t yet approved or cleared by the [place] FDA and  has been authorized for detection and/or diagnosis of SARS-CoV-2 by FDA under an Emergency Use Authorization (EUA). This EUA will remain  in effect (meaning this test can be used) for the duration of the COVID-19 declaration under Section 564(b)(1) of the Act, 21 U.S.C.section 360bbb-3(b)(1), unless the authorization is terminated  or revoked sooner.       Influenza A by PCR NEGATIVE NEGATIVE Final   Influenza B by PCR NEGATIVE NEGATIVE Final    Comment: (NOTE) The Xpert Xpress SARS-CoV-2/FLU/RSV plus assay is intended as an aid in the diagnosis of influenza from Nasopharyngeal swab specimens and should not be used as a sole basis for treatment. Nasal washings and aspirates are unacceptable for Xpert Xpress SARS-CoV-2/FLU/RSV testing.  Fact Sheet for Patients: EntrepreneurPulse.com.au  Fact Sheet for Healthcare Providers: IncredibleEmployment.be  This test is not yet approved or cleared by the [place] FDA and has been authorized for detection and/or  diagnosis of SARS-CoV-2 by FDA under an Emergency Use Authorization (EUA). This EUA will remain in effect (meaning this test can be used) for the duration of the COVID-19 declaration under Section 564(b)(1) of the Act, 21 U.S.C. section 360bbb-3(b)(1), unless the authorization is terminated or revoked.  Performed at [facility], [address], [place], [place] [address]      Labs: BNP (last 3 results) Recent Labs     06/23/20 1055 06/27/20 1428 09/05/20 2154  BNP 173.2* 72.0 62.7   Basic Metabolic Panel: Recent Labs  Lab 10/19/20 1752 10/20/20 0508 10/21/20 0949 10/22/20 0220  NA 136  --  138 139  K 4.0  --  3.8 3.5  CL 101  --  110 109  CO2 25  --  21* 24  GLUCOSE 92  --  120* 93  BUN 21*  --  10 11  CREATININE 1.22* 0.92 0.87 1.00  CALCIUM 9.1  --  9.0 9.1   Liver Function Tests: Recent Labs  Lab 10/19/20 1752  AST 27  ALT 22  ALKPHOS 71  BILITOT 1.1  PROT 7.2  ALBUMIN 4.2   No results for input(s): LIPASE, AMYLASE in the last 168 hours. No results for input(s): AMMONIA in the last 168 hours. CBC: Recent Labs  Lab 10/19/20 1752  WBC 8.6  NEUTROABS 5.1  HGB 12.8  HCT 38.7  MCV 89.2  PLT 400   Cardiac Enzymes: No results for input(s): CKTOTAL, CKMB, CKMBINDEX, TROPONINI in the last 168 hours. BNP: Invalid input(s): POCBNP CBG: Recent Labs  Lab 10/19/20 2246 10/20/20 0912 10/20/20 1304 10/20/20 1547  GLUCAP 77 97 113* 117*   D-Dimer No results for input(s): DDIMER in the last 72 hours. Hgb A1c Recent Labs    10/20/20 0508  HGBA1C 5.6   Lipid Profile Recent Labs    10/20/20 0508  CHOL 99  HDL 36*  LDLCALC 7  TRIG 278*  CHOLHDL 2.8   Thyroid function studies No results for input(s): TSH, T4TOTAL, T3FREE, THYROIDAB in the last 72 hours.  Invalid input(s): FREET3 Anemia work up No results for input(s): VITAMINB12, FOLATE, FERRITIN, TIBC, IRON, RETICCTPCT in the last 72 hours. Urinalysis    Component Value Date/Time   COLORURINE YELLOW 01/07/2020 1906   APPEARANCEUR CLEAR 01/07/2020 1906   LABSPEC 1.017 01/07/2020 1906   PHURINE 5.0 01/07/2020 1906   GLUCOSEU >=500 (A) 01/07/2020 1906   HGBUR NEGATIVE 01/07/2020 1906   BILIRUBINUR NEGATIVE 01/07/2020 1906   KETONESUR NEGATIVE 01/07/2020 1906   PROTEINUR NEGATIVE 01/07/2020 1906   NITRITE NEGATIVE 01/07/2020 1906   LEUKOCYTESUR NEGATIVE 01/07/2020 1906   Sepsis Labs Invalid input(s):  PROCALCITONIN,  WBC,  LACTICIDVEN Microbiology Recent Results (from the past 240 hour(s))  Resp Panel by RT-PCR (Flu A&B, Covid) Nasopharyngeal Swab     Status: None   Collection Time: 10/19/20  6:01 PM   Specimen: Nasopharyngeal Swab; Nasopharyngeal(NP) swabs in vial transport medium  Result Value Ref Range Status   SARS Coronavirus 2 by RT PCR NEGATIVE NEGATIVE Final    Comment: (NOTE) SARS-CoV-2 target nucleic acids are NOT DETECTED.  The SARS-CoV-2 RNA is generally detectable in upper respiratory specimens during the acute phase of infection. The lowest concentration of SARS-CoV-2 viral copies this assay can detect is 138 copies/mL. A negative result does not preclude SARS-Cov-2 infection and should not be used as the sole basis for treatment or other patient management decisions. A negative result may occur with  improper specimen collection/handling, submission of specimen other  than nasopharyngeal swab, presence of viral mutation(s) within the areas targeted by this assay, and inadequate number of viral copies(<138 copies/mL). A negative result must be combined with clinical observations, patient history, and epidemiological information. The expected result is Negative.  Fact Sheet for Patients:  EntrepreneurPulse.com.au  Fact Sheet for Healthcare Providers:  IncredibleEmployment.be  This test is no t yet approved or cleared by the [place] FDA and  has been authorized for detection and/or diagnosis of SARS-CoV-2 by FDA under an Emergency Use Authorization (EUA). This EUA will remain  in effect (meaning this test can be used) for the duration of the COVID-19 declaration under Section 564(b)(1) of the Act, 21 U.S.C.section 360bbb-3(b)(1), unless the authorization is terminated  or revoked sooner.       Influenza A by PCR NEGATIVE NEGATIVE Final   Influenza B by PCR NEGATIVE NEGATIVE Final    Comment: (NOTE) The Xpert Xpress  SARS-CoV-2/FLU/RSV plus assay is intended as an aid in the diagnosis of influenza from Nasopharyngeal swab specimens and should not be used as a sole basis for treatment. Nasal washings and aspirates are unacceptable for Xpert Xpress SARS-CoV-2/FLU/RSV testing.  Fact Sheet for Patients: EntrepreneurPulse.com.au  Fact Sheet for Healthcare Providers: IncredibleEmployment.be  This test is not yet approved or cleared by the [place] FDA and has been authorized for detection and/or diagnosis of SARS-CoV-2 by FDA under an Emergency Use Authorization (EUA). This EUA will remain in effect (meaning this test can be used) for the duration of the COVID-19 declaration under Section 564(b)(1) of the Act, 21 U.S.C. section 360bbb-3(b)(1), unless the authorization is terminated or revoked.  Performed at [facility], [address] [place], [place] [address]      Time coordinating discharge: Over 30 minutes  SIGNED:   Charlynne Cousins, MD  [facility] 10/22/2020, 9:08 AM Pager   If 7PM-7AM, please contact night-coverage [email or web address] Password [email or web address]

## 2020-10-22 NOTE — Progress Notes (Signed)
Patient discharged home with husband.

## 2020-10-22 NOTE — TOC Transition Note (Signed)
Transition of Care [facility]) - CM/SW Discharge Note   Patient Details  Name: MIKI LABUDA MRN: [id number] Date of Birth: [birth year]  Transition of [facility]) CM/SW Contact:  Pollie Friar, RN Phone Number: 10/22/2020, 10:00 AM   Clinical Narrative:    Patient discharging home with outpatient therapy. Information on the AVS.  Pt has transport home.   Final next level of care: OP Rehab Barriers to Discharge: No Barriers Identified   Patient Goals and CMS Choice   CMS Medicare.gov Compare Post Acute Care list provided to:: Patient Choice offered to / list presented to : Patient  Discharge Placement                       Discharge Plan and Services   Discharge Planning Services: CM Consult                                 Social Determinants of Health (SDOH) Interventions     Readmission Risk Interventions No flowsheet data found.

## 2020-10-22 NOTE — Progress Notes (Signed)
MRI C-spine completed: - No cord lesion.  No canal stenosis. - Facet osteoarthritis on the left at C5-6 with a joint effusion. This could be a cause of left neck pain. Mild foraminal narrowing on the left without apparent compression of the exiting left C6 nerve. - Minimal disc bulge and facet hypertrophy at C6-7 without stenosis.  Assessment: 60 yo female here for evaluation of left sided weakness. Acute CVA was ruled out by MRI brain, but there is evidence of old strokes. MRI C-spine negative for spinal cord lesion. She has significantly improved regarding her weakness on initial presentation.   - ST signed off.  - PT/OT recommend outpatient treatment.  - TTE has been cancelled by medicine team.    Recommendations: - Outpatient Neurology follow up - Neurohospitalist team will sign off. Please call if there are additional questions.   Electronically signed: Dr. Kerney Elbe

## 2020-10-22 NOTE — Discharge Instructions (Signed)
Erin [name] was admitted to the Hospital on 10/19/2020 and Discharged on Discharge Date 10/22/2020 and should be excused from work/school   for 5  days starting 10/19/2020 , may return to work/school without any restrictions.  Call Bess Harvest MD, [place] [facility]ist [phone] with questions.  Charlynne Cousins M.D on 10/22/2020,at 12:26 PM  [facility] Office  [phone]

## 2020-10-22 NOTE — Progress Notes (Signed)
This RN requested a work not for this patient. I ave messaged and paged MD twice with no response. The patient is extremely upset.

## 2020-10-22 NOTE — Plan of Care (Signed)
  Problem: Education: Goal: Knowledge of General Education information will improve Description: Including pain rating scale, medication(s)/side effects and non-pharmacologic comfort measures 10/22/2020 1123 by Vesta Mixer, RN Outcome: Adequate for Discharge 10/22/2020 1122 by Vesta Mixer, RN Outcome: Adequate for Discharge   Problem: Health Behavior/Discharge Planning: Goal: Ability to manage health-related needs will improve 10/22/2020 1123 by Vesta Mixer, RN Outcome: Adequate for Discharge 10/22/2020 1122 by Vesta Mixer, RN Outcome: Adequate for Discharge   Problem: Clinical Measurements: Goal: Ability to maintain clinical measurements within normal limits will improve 10/22/2020 1123 by Vesta Mixer, RN Outcome: Adequate for Discharge 10/22/2020 1122 by Vesta Mixer, RN Outcome: Adequate for Discharge Goal: Will remain free from infection 10/22/2020 1123 by Vesta Mixer, RN Outcome: Adequate for Discharge 10/22/2020 1122 by Vesta Mixer, RN Outcome: Adequate for Discharge Goal: Diagnostic test results will improve 10/22/2020 1123 by Vesta Mixer, RN Outcome: Adequate for Discharge 10/22/2020 1122 by Vesta Mixer, RN Outcome: Adequate for Discharge Goal: Respiratory complications will improve 10/22/2020 1123 by Vesta Mixer, RN Outcome: Adequate for Discharge 10/22/2020 1122 by Vesta Mixer, RN Outcome: Adequate for Discharge Goal: Cardiovascular complication will be avoided 10/22/2020 1123 by Vesta Mixer, RN Outcome: Adequate for Discharge 10/22/2020 1122 by Vesta Mixer, RN Outcome: Adequate for Discharge   Problem: Activity: Goal: Risk for activity intolerance will decrease 10/22/2020 1123 by Vesta Mixer, RN Outcome: Adequate for Discharge 10/22/2020 1122 by Vesta Mixer, RN Outcome: Adequate for Discharge   Problem: Nutrition: Goal: Adequate nutrition will be maintained 10/22/2020 1123 by Vesta Mixer,  RN Outcome: Adequate for Discharge 10/22/2020 1122 by Vesta Mixer, RN Outcome: Adequate for Discharge   Problem: Coping: Goal: Level of anxiety will decrease 10/22/2020 1123 by Vesta Mixer, RN Outcome: Adequate for Discharge 10/22/2020 1122 by Vesta Mixer, RN Outcome: Adequate for Discharge   Problem: Safety: Goal: Ability to remain free from injury will improve 10/22/2020 1123 by Vesta Mixer, RN Outcome: Adequate for Discharge 10/22/2020 1122 by Vesta Mixer, RN Outcome: Adequate for Discharge   Problem: Skin Integrity: Goal: Risk for impaired skin integrity will decrease 10/22/2020 1123 by Vesta Mixer, RN Outcome: Adequate for Discharge 10/22/2020 1122 by Vesta Mixer, RN Outcome: Adequate for Discharge   Problem: Education: Goal: Knowledge of disease or condition will improve 10/22/2020 1123 by Vesta Mixer, RN Outcome: Adequate for Discharge 10/22/2020 1122 by Vesta Mixer, RN Outcome: Adequate for Discharge Goal: Knowledge of secondary prevention will improve 10/22/2020 1123 by Vesta Mixer, RN Outcome: Adequate for Discharge 10/22/2020 1122 by Vesta Mixer, RN Outcome: Adequate for Discharge Goal: Knowledge of patient specific risk factors addressed and post discharge goals established will improve 10/22/2020 1123 by Vesta Mixer, RN Outcome: Adequate for Discharge 10/22/2020 1122 by Vesta Mixer, RN Outcome: Adequate for Discharge   Problem: Ischemic Stroke/TIA Tissue Perfusion: Goal: Complications of ischemic stroke/TIA will be minimized 10/22/2020 1123 by Vesta Mixer, RN Outcome: Adequate for Discharge 10/22/2020 1122 by Vesta Mixer, RN Outcome: Adequate for Discharge

## 2020-10-26 ENCOUNTER — Telehealth (HOSPITAL_COMMUNITY): Payer: Self-pay | Admitting: *Deleted

## 2020-10-26 NOTE — Telephone Encounter (Signed)
Pt left vm stating she had a question about a medication. I called pt back no answer/left vm requesting return call.

## 2020-10-27 ENCOUNTER — Telehealth (HOSPITAL_COMMUNITY): Payer: Self-pay

## 2020-10-27 ENCOUNTER — Other Ambulatory Visit (HOSPITAL_COMMUNITY): Payer: Self-pay | Admitting: Student

## 2020-10-27 MED ORDER — CLOPIDOGREL BISULFATE 75 MG PO TABS: 75.0000 mg | ORAL_TABLET | Freq: Every day | ORAL | 3 refills | Status: DC

## 2020-10-27 NOTE — Telephone Encounter (Signed)
Pt called for Plavix refill. Sent a message to our PA Radonna Ricker) for refill. AW

## 2020-10-28 NOTE — Progress Notes (Signed)
Remote ICD transmission.   

## 2020-10-29 ENCOUNTER — Other Ambulatory Visit (HOSPITAL_COMMUNITY): Payer: Self-pay | Admitting: Cardiology

## 2020-11-05 ENCOUNTER — Ambulatory Visit (INDEPENDENT_AMBULATORY_CARE_PROVIDER_SITE_OTHER): Payer: 59

## 2020-11-05 DIAGNOSIS — Z95810 Presence of automatic (implantable) cardiac defibrillator: Secondary | ICD-10-CM | POA: Diagnosis not present

## 2020-11-05 DIAGNOSIS — I5042 Chronic combined systolic (congestive) and diastolic (congestive) heart failure: Secondary | ICD-10-CM

## 2020-11-06 ENCOUNTER — Telehealth: Payer: Self-pay

## 2020-11-06 NOTE — Progress Notes (Signed)
EPIC Encounter for ICM Monitoring  Patient Name: Erin [name] is a 60 y.o. female Date: 11/06/2020 Primary Care Physican: [name], MD Primary Cardiologist: Aundra Dubin Electrophysiologist: Curt Bears 09/28/2020 Weight: 116 lbs  10/06/2020 Weight: 119 lbs                                                           Attempted call to patient and unable to reach.  Left message to return call. Transmission reviewed.   Patient hospitalized and CVA was ruled out.    Optivol thoracic impedance suggesting ongoing possible fluid accumulation starting 10/17/2020.   Prescribed: Furosemide 20 mg take 1 tablet daily (increased 09/30/20).   Spironolactone 25 mg take 0.5 tablet (12.5 mg total) daily.   Labs: 10/19/2020 Creatinine 1.22, BUN 21, Potassium 4.0, Sodium 136 10/07/2020 Creatinine 1.03, BUN 18, Potassium 4.4, Sodium 142, GFR 63  09/05/2020 Creatinine 1.37, BUN 23, Potassium 3.8, Sodium 135, GFR 45 08/06/2020 Creatinine 0.89, BUN 15, Potassium 3.9, Sodium 137, GFR >60 07/02/2020 Creatinine 0.94, BUN 16, Potassium 4.3, Sodium 137, GFR >60  A complete set of results can be found in Results Review.   Recommendations: Unable to reach.  Copy sent to Dr Aundra Dubin for review and recommendations if needed.    Follow-up plan: ICM clinic phone appointment on 11/11/2020 (manual) to recheck fluid levels.   91 day device clinic remote transmission 01/04/2021.     EP/Cardiology Office Visits:  12/10/2020 with Dr Aundra Dubin   Copy of ICM check sent to Dr. Curt Bears.    3 month ICM trend: 11/05/2020.    1 Year ICM trend:       Rosalene Billings, RN 11/06/2020 2:17 PM

## 2020-11-06 NOTE — Telephone Encounter (Signed)
Remote ICM transmission received.  Attempted call to patient regarding ICM remote transmission and left message per DPR to return call.   

## 2020-11-07 ENCOUNTER — Other Ambulatory Visit (HOSPITAL_COMMUNITY): Payer: Self-pay | Admitting: Cardiology

## 2020-11-07 DIAGNOSIS — I5042 Chronic combined systolic (congestive) and diastolic (congestive) heart failure: Secondary | ICD-10-CM

## 2020-11-07 NOTE — Progress Notes (Signed)
Increase Lasix to 40 qam/20 qpm x 2 days then 40 mg daily after that.  BMET 1 week.

## 2020-11-10 ENCOUNTER — Other Ambulatory Visit (HOSPITAL_COMMUNITY): Payer: Self-pay

## 2020-11-10 DIAGNOSIS — I5042 Chronic combined systolic (congestive) and diastolic (congestive) heart failure: Secondary | ICD-10-CM

## 2020-11-10 MED ORDER — DAPAGLIFLOZIN PROPANEDIOL 10 MG PO TABS: 10.0000 mg | ORAL_TABLET | Freq: Every day | ORAL | 3 refills | Status: DC

## 2020-11-10 MED ORDER — FUROSEMIDE 20 MG PO TABS
40.0000 mg | ORAL_TABLET | Freq: Every day | ORAL | 3 refills | Status: DC
  Filled 2020-11-11 – 2020-11-20 (×3): qty 180, 90d supply, fill #0

## 2020-11-10 NOTE — Progress Notes (Signed)
Spoke with patient.  Advised Dr Aundra Dubin recommended to increase Lasix to 40 qam/20 qpm x 2 days then 40 mg daily after that.  BMET 1 week.  She agreed to changes and will have labs drawn in [facility] on [address].  She starts a new job next week and unable to get labs drawn until 7/16 or 7/18.   She has Lasix supply on hand for increased dosage and will call when refill is needed.  Will recheck fluid levels on 7/11 (manual send) after dosage increase.

## 2020-11-11 ENCOUNTER — Other Ambulatory Visit (HOSPITAL_COMMUNITY): Payer: Self-pay | Admitting: Cardiology

## 2020-11-11 ENCOUNTER — Other Ambulatory Visit (HOSPITAL_COMMUNITY): Payer: Self-pay

## 2020-11-11 MED ORDER — ROPINIROLE HCL 0.5 MG PO TABS
0.5000 mg | ORAL_TABLET | Freq: Every day | ORAL | 0 refills | Status: DC
  Filled 2020-11-11: qty 90, 90d supply, fill #0

## 2020-11-11 MED ORDER — GABAPENTIN 100 MG PO CAPS
100.0000 mg | ORAL_CAPSULE | Freq: Two times a day (BID) | ORAL | 2 refills | Status: DC
  Filled 2020-11-11 (×2): qty 180, 90d supply, fill #0

## 2020-11-11 MED ORDER — LORAZEPAM 1 MG PO TABS
0.5000 mg | ORAL_TABLET | Freq: Every day | ORAL | 0 refills | Status: DC
  Filled 2020-11-11: qty 30, 30d supply, fill #0

## 2020-11-11 MED FILL — Atorvastatin Calcium Tab 80 MG (Base Equivalent): ORAL | 90 days supply | Qty: 90 | Fill #0 | Status: AC

## 2020-11-11 MED FILL — Ezetimibe Tab 10 MG: ORAL | 30 days supply | Qty: 30 | Fill #0 | Status: CN

## 2020-11-11 MED FILL — Dapagliflozin Propanediol Tab 10 MG (Base Equivalent): ORAL | 90 days supply | Qty: 90 | Fill #0 | Status: CN

## 2020-11-11 MED FILL — Atorvastatin Calcium Tab 80 MG (Base Equivalent): ORAL | 90 days supply | Qty: 90 | Fill #0 | Status: CN

## 2020-11-12 ENCOUNTER — Other Ambulatory Visit (HOSPITAL_COMMUNITY): Payer: Self-pay

## 2020-11-13 ENCOUNTER — Other Ambulatory Visit (HOSPITAL_COMMUNITY): Payer: Self-pay

## 2020-11-13 ENCOUNTER — Other Ambulatory Visit (HOSPITAL_COMMUNITY): Payer: Self-pay | Admitting: Cardiology

## 2020-11-13 MED ORDER — CARVEDILOL 6.25 MG PO TABS
3.1250 mg | ORAL_TABLET | Freq: Two times a day (BID) | ORAL | 6 refills | Status: DC
Start: 2020-11-13 — End: 2021-01-08
  Filled 2020-11-13: qty 30, 30d supply, fill #0
  Filled 2020-12-15: qty 30, 30d supply, fill #1

## 2020-11-13 MED FILL — Dapagliflozin Propanediol Tab 10 MG (Base Equivalent): ORAL | 90 days supply | Qty: 90 | Fill #0 | Status: AC

## 2020-11-13 MED FILL — Ezetimibe Tab 10 MG: ORAL | 30 days supply | Qty: 30 | Fill #0 | Status: AC

## 2020-11-16 ENCOUNTER — Ambulatory Visit (INDEPENDENT_AMBULATORY_CARE_PROVIDER_SITE_OTHER): Payer: 59

## 2020-11-16 DIAGNOSIS — Z95810 Presence of automatic (implantable) cardiac defibrillator: Secondary | ICD-10-CM

## 2020-11-16 DIAGNOSIS — I5042 Chronic combined systolic (congestive) and diastolic (congestive) heart failure: Secondary | ICD-10-CM

## 2020-11-18 ENCOUNTER — Telehealth: Payer: Self-pay

## 2020-11-18 NOTE — Telephone Encounter (Signed)
Remote ICM transmission received.  Attempted call to patient regarding ICM remote transmission and left detailed message per DPR.  Advised to return call for any fluid symptoms or questions. Next ICM remote transmission scheduled 12/14/2020.

## 2020-11-18 NOTE — Progress Notes (Signed)
EPIC Encounter for ICM Monitoring  Patient Name: Erin [name] is a 60 y.o. female Date: 11/18/2020 Primary Care Physican: [name], MD Primary Cardiologist: Aundra Dubin Electrophysiologist: Curt Bears 09/28/2020 Weight: 116 lbs  10/06/2020 Weight: 119 lbs                                                           Attempted call to patient and unable to reach.  Left detailed message per DPR regarding transmission. Transmission reviewed.    Optivol thoracic impedance suggesting normal fluid levels.   Prescribed: Furosemide 20 mg Take 2 tablets (40 mg total) by mouth daily. Spironolactone 25 mg take 0.5 tablet (12.5 mg total) daily.   Labs: 10/19/2020 Creatinine 1.22, BUN 21, Potassium 4.0, Sodium 136 10/07/2020 Creatinine 1.03, BUN 18, Potassium 4.4, Sodium 142, GFR 63  09/05/2020 Creatinine 1.37, BUN 23, Potassium 3.8, Sodium 135, GFR 45 08/06/2020 Creatinine 0.89, BUN 15, Potassium 3.9, Sodium 137, GFR >60 07/02/2020 Creatinine 0.94, BUN 16, Potassium 4.3, Sodium 137, GFR >60  A complete set of results can be found in Results Review.   Recommendations: Left voice mail with ICM number and encouraged to call if experiencing any fluid symptoms.   Follow-up plan: ICM clinic phone appointment on 12/14/2020.   91 day device clinic remote transmission 01/04/2021.     EP/Cardiology Office Visits:  12/10/2020 with Dr Aundra Dubin   Copy of ICM check sent to Dr. Curt Bears.    3 month ICM trend: 11/16/2020.    1 Year ICM trend:       Rosalene Billings, RN 11/18/2020 1:36 PM

## 2020-11-20 ENCOUNTER — Other Ambulatory Visit (HOSPITAL_COMMUNITY): Payer: Self-pay

## 2020-12-02 ENCOUNTER — Other Ambulatory Visit (HOSPITAL_COMMUNITY): Payer: Self-pay | Admitting: Interventional Radiology

## 2020-12-02 DIAGNOSIS — I771 Stricture of artery: Secondary | ICD-10-CM

## 2020-12-08 ENCOUNTER — Other Ambulatory Visit (HOSPITAL_COMMUNITY): Payer: Self-pay | Admitting: Family Medicine

## 2020-12-08 DIAGNOSIS — Z1231 Encounter for screening mammogram for malignant neoplasm of breast: Secondary | ICD-10-CM

## 2020-12-10 ENCOUNTER — Ambulatory Visit (HOSPITAL_COMMUNITY)
Admission: RE | Admit: 2020-12-10 | Discharge: 2020-12-10 | Disposition: A | Discharge status: Home / Self Care | Payer: Commercial Managed Care - PPO | Source: Ambulatory Visit | Attending: Cardiology | Admitting: Cardiology

## 2020-12-10 ENCOUNTER — Ambulatory Visit (HOSPITAL_COMMUNITY)
Admission: RE | Admit: 2020-12-10 | Discharge: 2020-12-10 | Disposition: A | Discharge status: Home / Self Care | Payer: Commercial Managed Care - PPO | Source: Ambulatory Visit | Attending: Interventional Radiology | Admitting: Interventional Radiology

## 2020-12-10 ENCOUNTER — Other Ambulatory Visit: Payer: Self-pay

## 2020-12-10 ENCOUNTER — Encounter (HOSPITAL_COMMUNITY): Payer: Self-pay | Admitting: Cardiology

## 2020-12-10 ENCOUNTER — Other Ambulatory Visit (HOSPITAL_COMMUNITY): Payer: Self-pay | Admitting: Cardiology

## 2020-12-10 VITALS — BP 100/69 | HR 76 | Wt 117.2 lb

## 2020-12-10 DIAGNOSIS — Z955 Presence of coronary angioplasty implant and graft: Secondary | ICD-10-CM | POA: Diagnosis not present

## 2020-12-10 DIAGNOSIS — Z79899 Other long term (current) drug therapy: Secondary | ICD-10-CM | POA: Diagnosis not present

## 2020-12-10 DIAGNOSIS — I771 Stricture of artery: Secondary | ICD-10-CM | POA: Diagnosis not present

## 2020-12-10 DIAGNOSIS — Z7982 Long term (current) use of aspirin: Secondary | ICD-10-CM | POA: Insufficient documentation

## 2020-12-10 DIAGNOSIS — I700 Atherosclerosis of aorta: Secondary | ICD-10-CM | POA: Diagnosis not present

## 2020-12-10 DIAGNOSIS — Z7902 Long term (current) use of antithrombotics/antiplatelets: Secondary | ICD-10-CM | POA: Diagnosis not present

## 2020-12-10 DIAGNOSIS — I5042 Chronic combined systolic (congestive) and diastolic (congestive) heart failure: Secondary | ICD-10-CM

## 2020-12-10 DIAGNOSIS — Z8249 Family history of ischemic heart disease and other diseases of the circulatory system: Secondary | ICD-10-CM | POA: Diagnosis not present

## 2020-12-10 DIAGNOSIS — Z7984 Long term (current) use of oral hypoglycemic drugs: Secondary | ICD-10-CM | POA: Diagnosis not present

## 2020-12-10 DIAGNOSIS — F17210 Nicotine dependence, cigarettes, uncomplicated: Secondary | ICD-10-CM | POA: Diagnosis not present

## 2020-12-10 DIAGNOSIS — R0600 Dyspnea, unspecified: Secondary | ICD-10-CM | POA: Diagnosis present

## 2020-12-10 DIAGNOSIS — I252 Old myocardial infarction: Secondary | ICD-10-CM | POA: Insufficient documentation

## 2020-12-10 DIAGNOSIS — Z7901 Long term (current) use of anticoagulants: Secondary | ICD-10-CM | POA: Insufficient documentation

## 2020-12-10 DIAGNOSIS — I255 Ischemic cardiomyopathy: Secondary | ICD-10-CM | POA: Insufficient documentation

## 2020-12-10 DIAGNOSIS — I6522 Occlusion and stenosis of left carotid artery: Secondary | ICD-10-CM | POA: Insufficient documentation

## 2020-12-10 DIAGNOSIS — I2510 Atherosclerotic heart disease of native coronary artery without angina pectoris: Secondary | ICD-10-CM | POA: Insufficient documentation

## 2020-12-10 LAB — BASIC METABOLIC PANEL
Anion gap: 9 (ref 5–15)
BUN: 16 mg/dL (ref 6–20)
CO2: 25 mmol/L (ref 22–32)
Calcium: 9.3 mg/dL (ref 8.9–10.3)
Chloride: 103 mmol/L (ref 98–111)
Creatinine, Ser: 1.07 mg/dL — ABNORMAL HIGH (ref 0.44–1.00)
GFR, Estimated: >60 mL/min (ref >60)
Glucose, Bld: 102 mg/dL — ABNORMAL HIGH (ref 70–99)
Potassium: 3.7 mmol/L (ref 3.5–5.1)
Sodium: 137 mmol/L (ref 135–145)

## 2020-12-10 LAB — POCT I-STAT CREATININE: Creatinine, Ser: 1.1 mg/dL — ABNORMAL HIGH (ref 0.44–1.00)

## 2020-12-10 LAB — BRAIN NATRIURETIC PEPTIDE: B Natriuretic Peptide: 99.4 pg/mL (ref 0.0–100.0)

## 2020-12-10 MED ORDER — IOHEXOL 350 MG/ML SOLN
100.0000 mL | Freq: Once | INTRAVENOUS | Status: AC | PRN
  Administered 2020-12-10: 80 mL via INTRAVENOUS

## 2020-12-10 NOTE — Progress Notes (Signed)
PCP: [name], MD HF Cardiology: [name]  60 y.o. with history of CAD, ischemic cardiomyopathy, and PAD was referred by [name] for evaluation of CHF.  She had NSTEMI in 11/17 with PCI to prox/mid LCx and RCA.  Subsequently, has developed ischemic cardiomyopathy.  Most recent echo in 1/21 showed EF 30-35%.  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   RHC/LHC done with exertional chest heaviness and dyspnea in 4/21 showed normal filling pressures, preserved cardiac output, and nonobstructive mild CAD.  ABIs were normal in 4/21.   In 8/21, she had syncope thought to be related to orthostasis from low BP in the setting of cardiac medication titration.  Entresto was decreased.   She was in the ER in 11/21 with chest pain.  Troponin and COVID-19 negative. Echo in 11/21 showed EF 30-35%, normal RV.   She was hospitalized in 6/22 with left-sided weakness/numbness.  No evidence for acute CVA, ?TIA.    She returns for followup of CHF.  Still smoking 4 cigarettes/week.  Weight is down 2 lbs. She is working at [employer] as a Chartered certified accountant.  No dyspnea walking on flat ground.  Mild dyspnea walking up stairs.  She is exercising more than in the past.   No chest pain. No orthopnea/PND.   Labs (2/21): K 3.7, creatinine 0.82 Labs (4/21): K 4.3, creatinine 0.81, LDL 67, TGs 286 Labs (6/21): K 4.7, creatinine 1.22 Labs (11/21): K 3.7, creatinine 0.84, hgb 12.6, LDL 67, HDL 48, TGs 286 Labs (1/22). K 4.4, creatinine 1.03 Labs (6/22): K 3.5, creatinine 1.0  Medtronic device interrogation: thoracic impedance trending down with fluid index > threshold.  No AF/VT.   PMH: 1. CAD: NSTEMI with DES to proximal and mid LCx in 11/17, staged DES x 2 to RCA later in 11/17.  - Cardiolite (10/20): EF < 30%, large inferior and inferolateral MI with mild peri-infarct ischemia.  - LHC (4/21): Patent stents, nonobstructive CAD.  2. Chronic systolic CHF: Ischemic  cardiomyopathy. Medtronic ICD.  - Echo (10/20): EF 35-40%, lateral WMAs, normal RV. - Echo (1/21): EF 30-35%, mild LVH, normal RV - RHC (4/21): mean RA 5, PA 29/3, mean PCWP 12, CI 3.04 - Echo (11/21): EF 30-35%, normal RV.  3. 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  4. Carotid stenosis: Carotid dopplers ([id number]) with A999333 LICA stenosis.  - Carotid dopplers (6/21): 40-59% BICA stenosis.  - Carotid dopplers (1/22): 123456 RICA, A999333 LICA.  5. Active smoker.  6. PAD: ABIs (4/21) Normal.  - Peripheral artery dopplers (12/21): bilateral plaque without focal stenosis.   Social History   Socioeconomic History   Marital status: Married    Spouse name: Not on file   Number of children: Not on file   Years of education: Not on file   Highest education level: Not on file  Occupational History   Occupation: CNA  Tobacco Use   Smoking status: Some Days    Packs/day: 0.10    Years: 15.00    Pack years: 1.50    Types: Cigarettes   Smokeless tobacco: Never   Tobacco comments:    smokes a cigarette occasionally  Vaping Use   Vaping Use: Never used  Substance and Sexual Activity   Alcohol use: Yes    Comment: occasionally   Drug use: Not Currently    Types: Marijuana    Comment: former- 2017 last time   Sexual activity: Not Currently    Birth  control/protection: Surgical    Comment: hyst  Other Topics Concern   Not on file  Social History Narrative   Lives with husband in [place] in a one story home with a basement.  Has 4 children.  Works as a Quarry manager.  Education: CNA school.    Social Determinants of Health   Financial Resource Strain: Not on file  Food Insecurity: Not on file  Transportation Needs: Not on file  Physical Activity: Not on file  Stress: Not on file  Social Connections: Not on file  Intimate Partner Violence: Not on file   Family History  Problem Relation Age of Onset   Diabetes Father    Hypertension Father    CAD Father     Colon polyps Father 28       pre-cancerous    Stroke Father    Dementia Father    Stroke Mother    Hypertension Mother    Diabetes Mother    Heart failure Other    Breast cancer Maternal Grandmother    Colon cancer Neg Hx    ROS: All systems reviewed and negative except as per HPI.   Current Outpatient Medications  Medication Sig Dispense Refill   acetaminophen (TYLENOL) 325 MG tablet Take 325 mg by mouth every 6 (six) hours as needed for mild pain or headache.      ALPRAZolam (XANAX) 1 MG tablet Take 1-2 mg by mouth at bedtime.     aspirin EC 81 MG tablet Take 81 mg by mouth every evening.      atorvastatin (LIPITOR) 80 MG tablet Take 1 tablet (80 mg total) by mouth daily at 6 PM. 90 tablet 3   carvedilol (COREG) 6.25 MG tablet Take 0.5 tablets (3.125 mg total) by mouth 2 (two) times daily. 30 tablet 6   Cholecalciferol (VITAMIN D3) 10 MCG (400 UNIT) CAPS Take 1 capsule by mouth daily.     clopidogrel (PLAVIX) 75 MG tablet Take 1 tablet (75 mg total) by mouth daily. 30 tablet 3   dapagliflozin propanediol (FARXIGA) 10 MG TABS tablet Take 1 tablet (10 mg total) by mouth daily before breakfast 90 tablet 3   ezetimibe (ZETIA) 10 MG tablet Take 1 tablet (10 mg total) by mouth daily. 30 tablet 6   furosemide (LASIX) 20 MG tablet Take 2 tablets (40 mg total) by mouth daily. 180 tablet 3   gabapentin (NEURONTIN) 100 MG capsule Take 200 mg by mouth at bedtime.     loperamide (IMODIUM) 2 MG capsule Take 2 mg by mouth 4 (four) times daily as needed for diarrhea or loose stools (ibs).      LORazepam (ATIVAN) 1 MG tablet Take 0.5-1 tablets (0.5-1 mg total) by mouth daily. 30 tablet 0   Multiple Vitamins-Minerals (MULTIVITAMIN WITH MINERALS) tablet Take 1 tablet by mouth daily.     nitroGLYCERIN (NITROSTAT) 0.4 MG SL tablet Place 1 tablet (0.4 mg total) under the tongue every 5 (five) minutes x 3 doses as needed for chest pain (if no relief after 2nd dose, proceed to the ED for an evaluation or call  911). 25 tablet 2   omeprazole (PRILOSEC) 20 MG capsule Take 20 mg by mouth daily.      rOPINIRole (REQUIP) 0.5 MG tablet Take 0.5 mg by mouth at bedtime.   0   sacubitril-valsartan (ENTRESTO) 97-103 MG Take 1 tablet by mouth 2 (two) times daily. 180 tablet 3   zinc gluconate 50 MG tablet Take 50 mg by mouth daily.  spironolactone (ALDACTONE) 25 MG tablet Take 1/2 (one-half) tablet by mouth once daily 15 tablet 11   No current facility-administered medications for this encounter.   BP 100/69   Pulse 76   Wt 53.2 kg (117 lb 3.2 oz)   SpO2 98%   BMI 22.89 kg/m  General: NAD Neck: JVP 8 cm with HJR, no thyromegaly or thyroid nodule.  Lungs: Clear to auscultation bilaterally with normal respiratory effort. CV: Nondisplaced PMI.  Heart regular S1/S2, no S3/S4, no murmur.  No peripheral edema.  No carotid bruit.  Normal pedal pulses.  Abdomen: Soft, nontender, no hepatosplenomegaly, no distention.  Skin: Intact without lesions or rashes.  Neurologic: Alert and oriented x 3.  Psych: Normal affect. Extremities: No clubbing or cyanosis.  HEENT: Normal.   Assessment/Plan: 1. CAD: S/p NSTEMI in 11/17 with DEX to LCx and DES x 2 to RCA.  Cardiolite in 10/20 with EF <30%, inferior/inferolateral infarct with peri-infarct ischemia. LHC in 4/21 showed nonobstructive mild CAD.  No chest pain.  - Continue atorvastatin and Zetia. Good LDL 11/21.  - Continue ASA 81 and Plavix 75 mg daily for now.  She will likely be a good candidate to replace Plavix with rivaroxaban 2.5 mg bid in the future. She will continue Plavix as long as Dr. Estanislado Pandy feels it is needed post her cranial circulation intervention.  2. Chronic systolic CHF: Ischemic cardiomyopathy. Echo in 11/21 showed stable EF 30-35%.  RHC in 4/21 with normal filling pressures and preserved cardiac output.  NYHA class II symptoms.  Though not very symptomatic, she is volume overloaded by exam and Optivol.   - Continue Entresto 97/103 bid.   -  Increase Lasix to 40 qam/20 qpm x 3 days then 40 qam and 20 mg every other afternoon after that.  BMET/BNP today and BMET in 10 days.  - Continue Coreg at 3.125 mg bid.     - Continue spironolactone 25 mg daily.  - Continue dapagliflozin 10 mg daily.   - Echo at followup in 3 months.  3. Carotid stenosis: Repeat carotid dopplers in 1/23.  4. Smoking: We again discussed smoking cessation, she is only smoking about 4 cigs/week.  5. 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  - Continue Plavix per Dr. Estanislado Pandy.   Followup 3 months with echo.   Loralie Champagne 12/10/2020

## 2020-12-10 NOTE — Patient Instructions (Signed)
Labs done today. We will contact you only if your labs are abnormal.  START Lasix '40mg'$  (2 tablets) by mouth every morning and '20mg'$  (1 tablet) by mouth every evening for 3 days only then decrease back to as needed  No other medication changes were made. Please continue all current medications as prescribed.  Your physician recommends that you schedule a follow-up appointment in: 10 days for a lab only appointment and in 3 months with an echo prior to your exam.  If you have any questions or concerns before your next appointment please send [place] a message through [place] or call our office at [phone].    TO LEAVE A MESSAGE FOR THE NURSE SELECT OPTION 2, PLEASE LEAVE A MESSAGE INCLUDING: YOUR NAME DATE OF BIRTH CALL BACK NUMBER REASON FOR CALL**this is important as we prioritize the call backs  YOU WILL RECEIVE A CALL BACK THE SAME DAY AS LONG AS YOU CALL BEFORE 4:00 PM   Do the following things EVERYDAY: Weigh yourself in the morning before breakfast. Write it down and keep it in a log. Take your medicines as prescribed Eat low salt foods--Limit salt (sodium) to 2000 mg per day.  Stay as active as you can everyday Limit all fluids for the day to less than 2 liters   At the [facility], you and your health needs are our priority. As part of our continuing mission to provide you with exceptional heart care, we have created designated Provider Care Teams. These Care Teams include your primary Cardiologist (physician) and Advanced Practice Providers (APPs- Physician Assistants and Nurse Practitioners) who all work together to provide you with the care you need, when you need it.   You may see any of the following providers on your designated Care Team at your next follow up: Dr Glori Bickers Dr Haynes Kerns, NP Lyda Jester, [name] [name], PharmD   Please be sure to bring in all your medications bottles to every appointment.

## 2020-12-12 IMAGING — DX DG HAND COMPLETE 3+V*R*
3 series · 3 of 3 positions shown · non-contrast
Comparison: 12/22/2018

CLINICAL DATA: Injured right hand.

EXAM:
RIGHT HAND - COMPLETE 3+ VIEW

[hand pa]
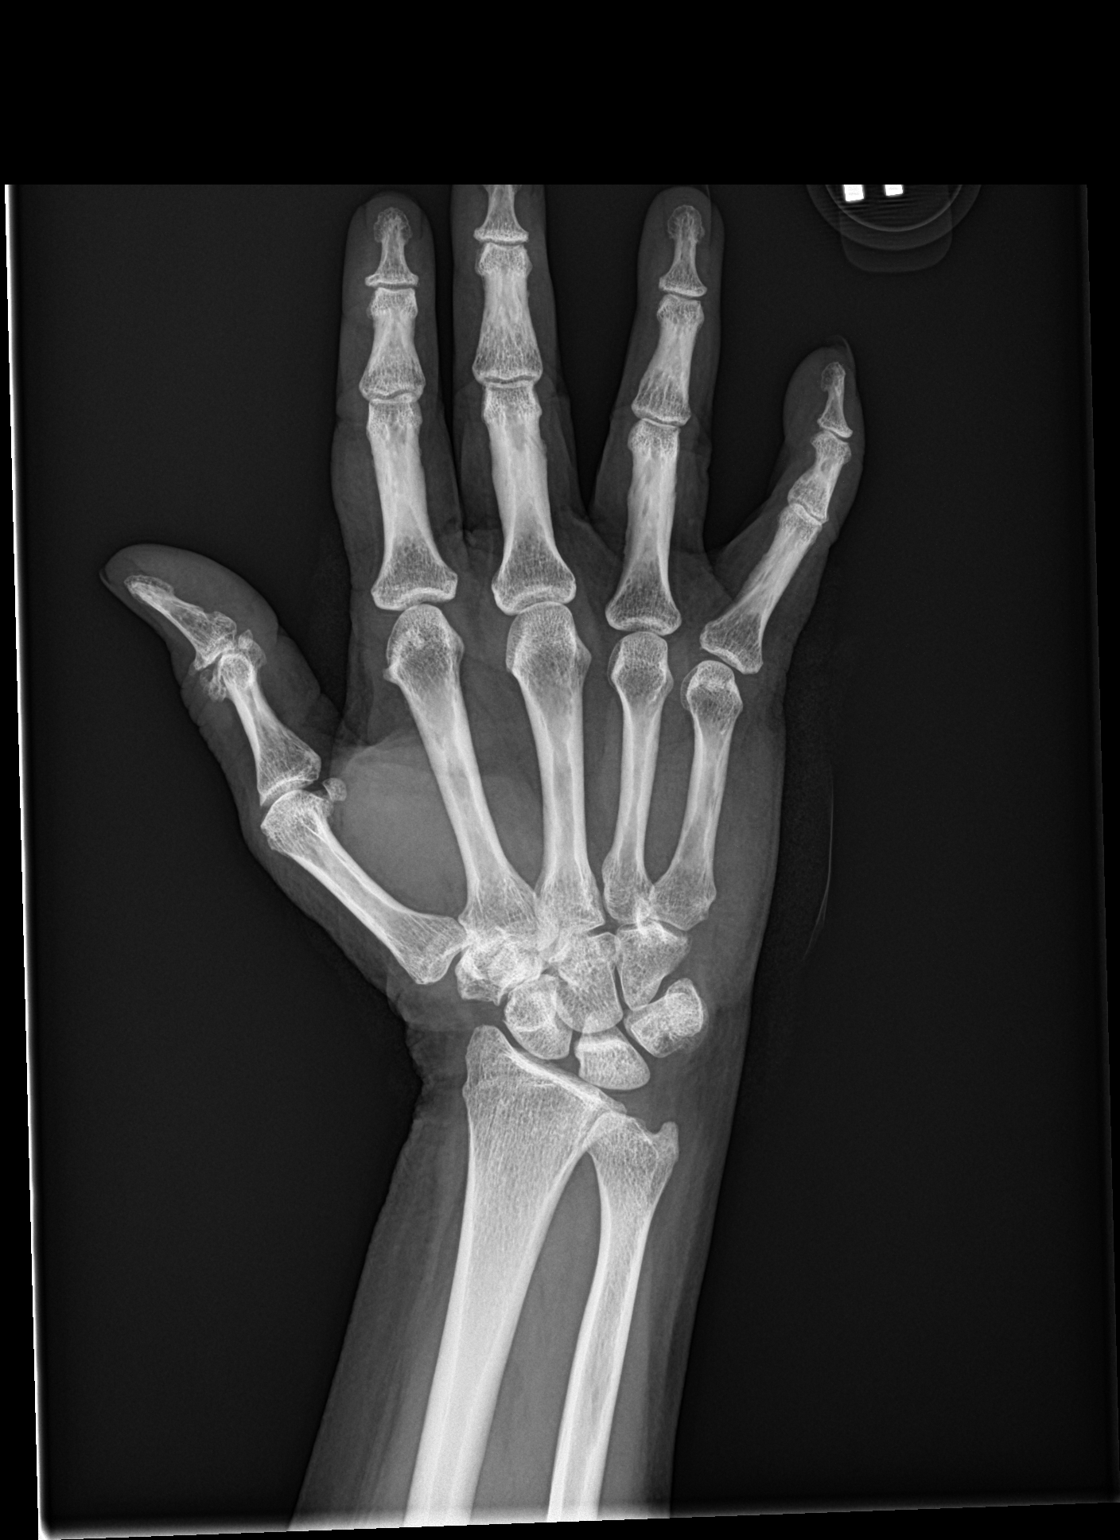

[hand obl]
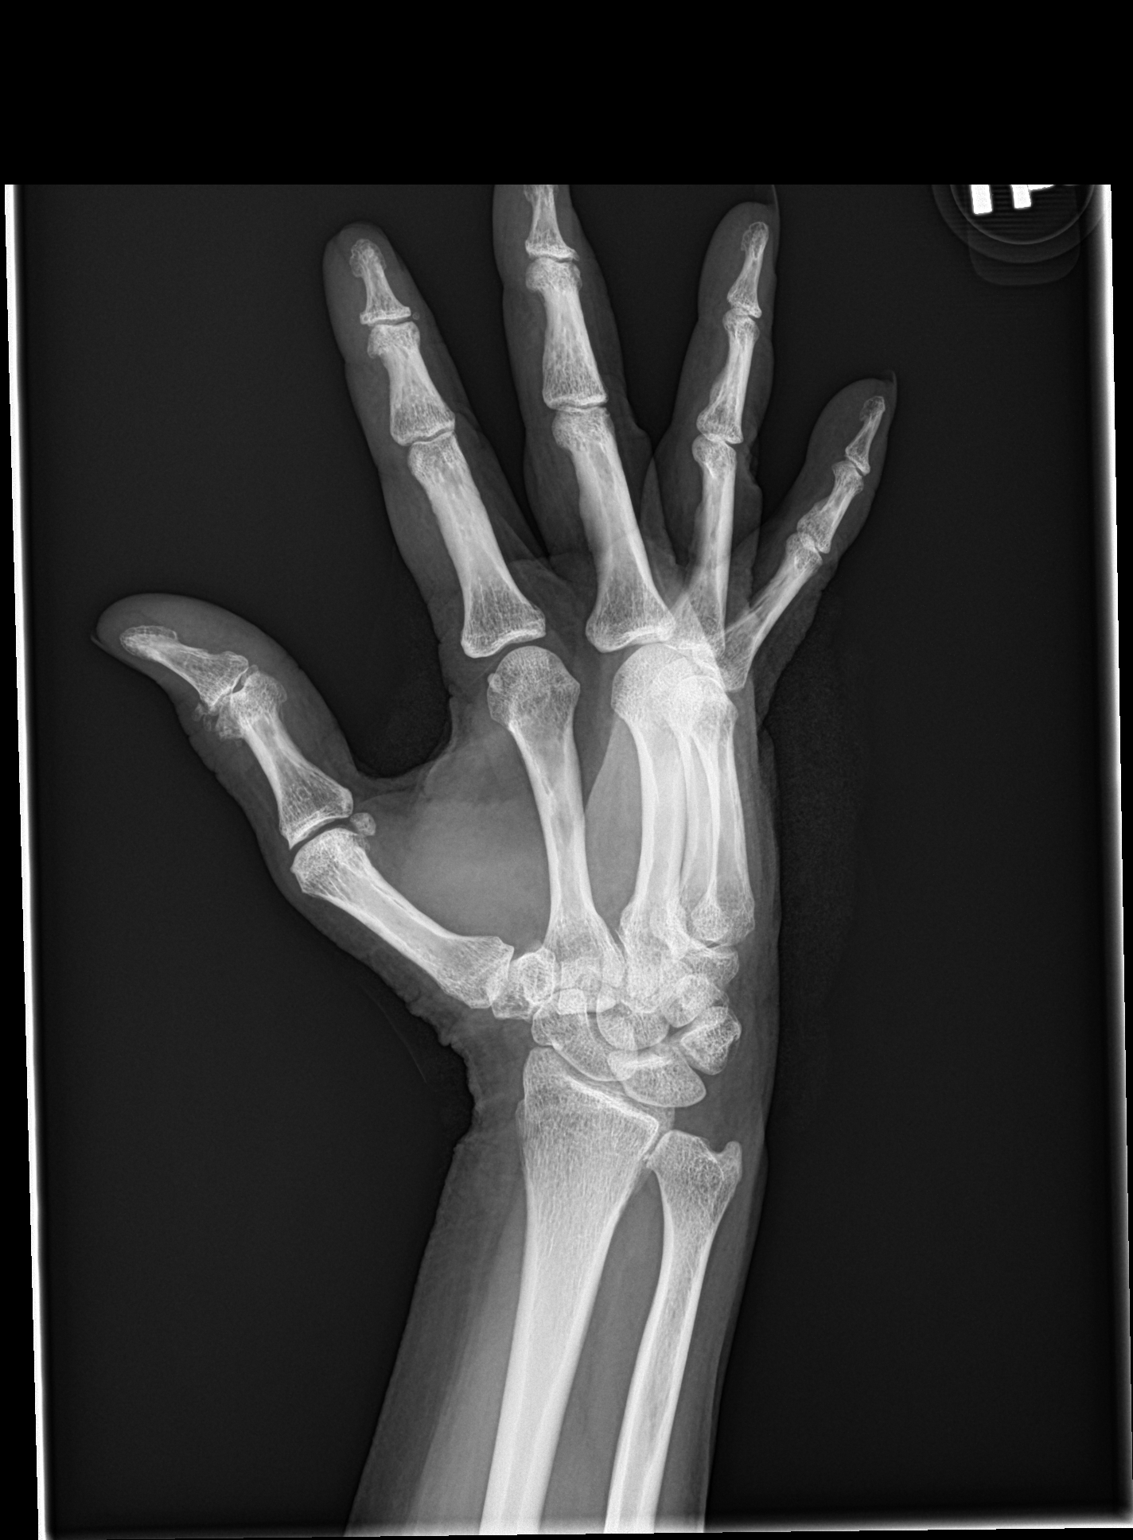

[hand lat]
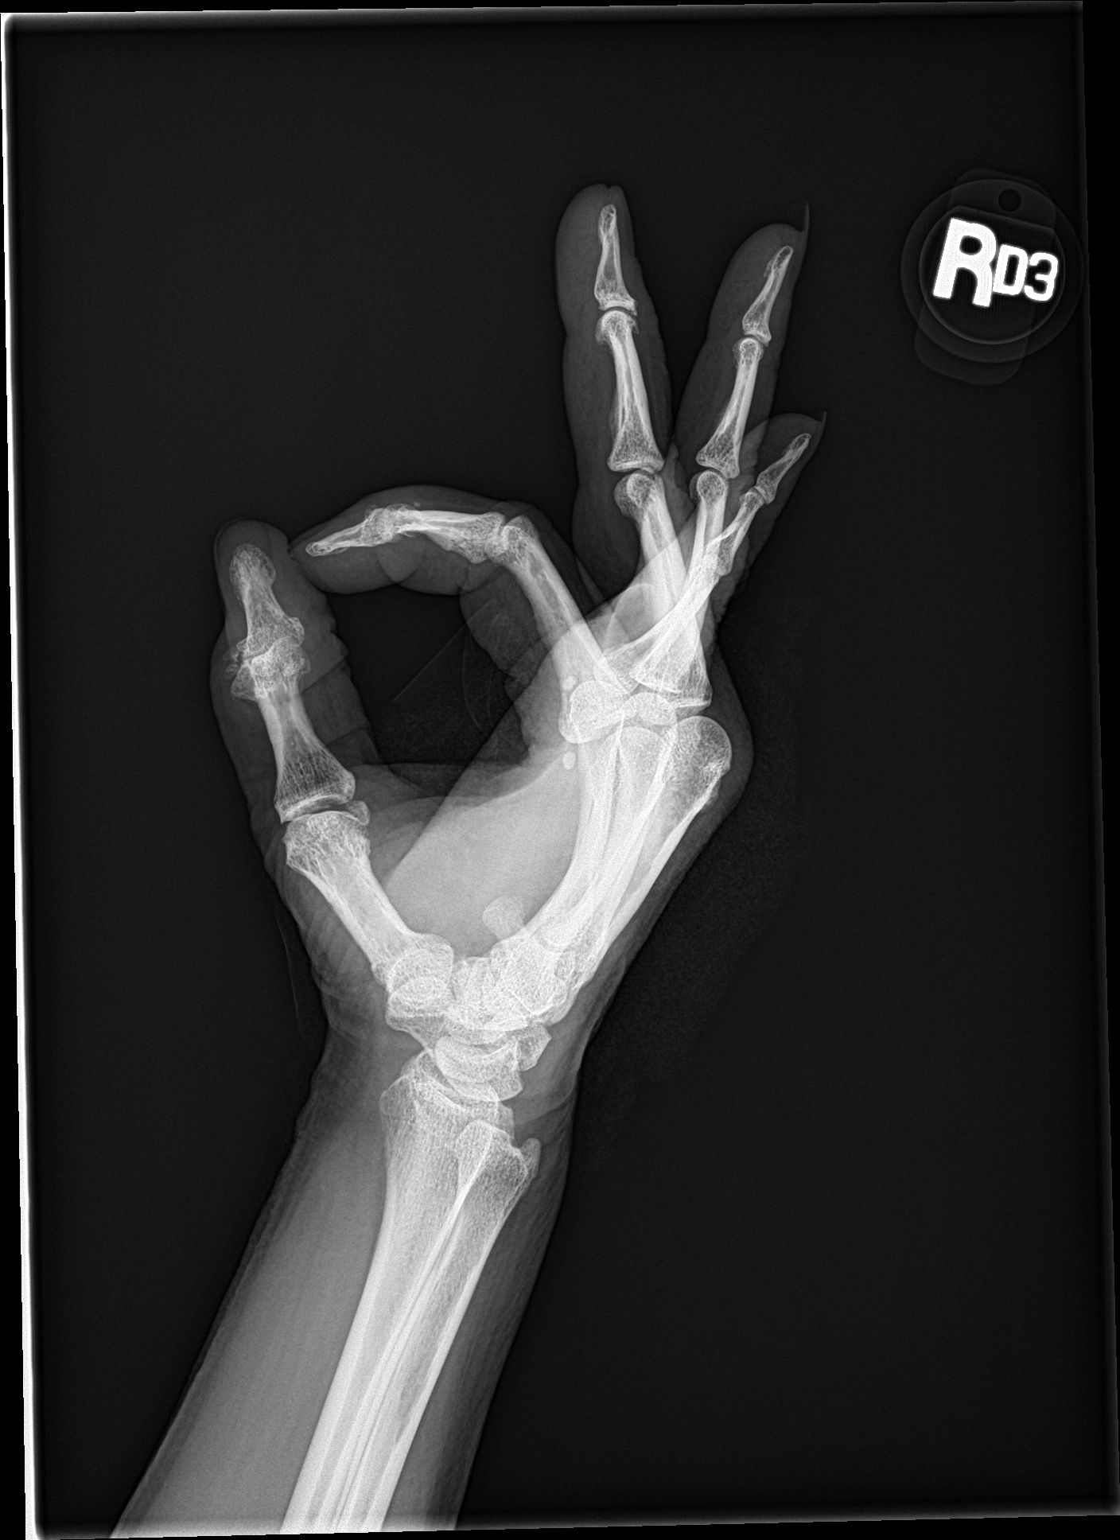

[3 of 3 positions shown; findings below may reference images not displayed]

FINDINGS: Mild stable degenerative changes are noted most notably at the
interphalangeal joint of the thumb. No acute fractures are
identified. No radiopaque foreign body.
IMPRESSION: Stable appearing degenerative changes but no acute fracture.

## 2020-12-13 ENCOUNTER — Other Ambulatory Visit: Payer: Self-pay

## 2020-12-13 ENCOUNTER — Encounter (HOSPITAL_COMMUNITY): Payer: Self-pay | Admitting: Emergency Medicine

## 2020-12-13 ENCOUNTER — Emergency Department (HOSPITAL_COMMUNITY): Payer: 59

## 2020-12-13 ENCOUNTER — Emergency Department (HOSPITAL_COMMUNITY)
Admission: EM | Admit: 2020-12-13 | Discharge: 2020-12-13 | Disposition: A | Discharge status: Home / Self Care | Payer: 59 | Attending: Emergency Medicine | Admitting: Emergency Medicine

## 2020-12-13 DIAGNOSIS — I110 Hypertensive heart disease with heart failure: Secondary | ICD-10-CM | POA: Insufficient documentation

## 2020-12-13 DIAGNOSIS — Z7982 Long term (current) use of aspirin: Secondary | ICD-10-CM | POA: Insufficient documentation

## 2020-12-13 DIAGNOSIS — I2510 Atherosclerotic heart disease of native coronary artery without angina pectoris: Secondary | ICD-10-CM | POA: Insufficient documentation

## 2020-12-13 DIAGNOSIS — F17210 Nicotine dependence, cigarettes, uncomplicated: Secondary | ICD-10-CM | POA: Diagnosis not present

## 2020-12-13 DIAGNOSIS — I952 Hypotension due to drugs: Secondary | ICD-10-CM

## 2020-12-13 DIAGNOSIS — W19XXXA Unspecified fall, initial encounter: Secondary | ICD-10-CM

## 2020-12-13 DIAGNOSIS — I5022 Chronic systolic (congestive) heart failure: Secondary | ICD-10-CM | POA: Insufficient documentation

## 2020-12-13 DIAGNOSIS — S80812A Abrasion, left lower leg, initial encounter: Secondary | ICD-10-CM | POA: Insufficient documentation

## 2020-12-13 DIAGNOSIS — W109XXA Fall (on) (from) unspecified stairs and steps, initial encounter: Secondary | ICD-10-CM | POA: Insufficient documentation

## 2020-12-13 DIAGNOSIS — T465X5A Adverse effect of other antihypertensive drugs, initial encounter: Secondary | ICD-10-CM | POA: Insufficient documentation

## 2020-12-13 DIAGNOSIS — Y929 Unspecified place or not applicable: Secondary | ICD-10-CM | POA: Diagnosis not present

## 2020-12-13 LAB — COMPREHENSIVE METABOLIC PANEL
ALT: 15 U/L (ref 0–44)
AST: 22 U/L (ref 15–41)
Albumin: 3.6 g/dL (ref 3.5–5.0)
Alkaline Phosphatase: 58 U/L (ref 38–126)
Anion gap: 8 (ref 5–15)
BUN: 16 mg/dL (ref 6–20)
CO2: 27 mmol/L (ref 22–32)
Calcium: 9.1 mg/dL (ref 8.9–10.3)
Chloride: 103 mmol/L (ref 98–111)
Creatinine, Ser: 0.84 mg/dL (ref 0.44–1.00)
GFR, Estimated: >60 mL/min (ref >60)
Glucose, Bld: 107 mg/dL — ABNORMAL HIGH (ref 70–99)
Potassium: 3.1 mmol/L — ABNORMAL LOW (ref 3.5–5.1)
Sodium: 138 mmol/L (ref 135–145)
Total Bilirubin: 0.6 mg/dL (ref 0.3–1.2)
Total Protein: 6.6 g/dL (ref 6.5–8.1)

## 2020-12-13 LAB — CBC WITH DIFFERENTIAL/PLATELET
Abs Immature Granulocytes: 0.04 10*3/uL (ref 0.00–0.07)
Basophils Absolute: 0 10*3/uL (ref 0.0–0.1)
Basophils Relative: 1 %
Eosinophils Absolute: 0.1 10*3/uL (ref 0.0–0.5)
Eosinophils Relative: 1 %
HCT: 35.5 % — ABNORMAL LOW (ref 36.0–46.0)
Hemoglobin: 11.6 g/dL — ABNORMAL LOW (ref 12.0–15.0)
Immature Granulocytes: 1 %
Lymphocytes Relative: 25 %
Lymphs Abs: 2 10*3/uL (ref 0.7–4.0)
MCH: 29.2 pg (ref 26.0–34.0)
MCHC: 32.7 g/dL (ref 30.0–36.0)
MCV: 89.4 fL (ref 80.0–100.0)
Monocytes Absolute: 0.6 10*3/uL (ref 0.1–1.0)
Monocytes Relative: 7 %
Neutro Abs: 5.3 10*3/uL (ref 1.7–7.7)
Neutrophils Relative %: 65 %
Platelets: 351 10*3/uL (ref 150–400)
RBC: 3.97 MIL/uL (ref 3.87–5.11)
RDW: 14.6 % (ref 11.5–15.5)
WBC: 8 10*3/uL (ref 4.0–10.5)
nRBC: 0 % (ref 0.0–0.2)

## 2020-12-13 MED ORDER — SODIUM CHLORIDE 0.9 % IV BOLUS
500.0000 mL | Freq: Once | INTRAVENOUS | Status: AC
  Administered 2020-12-13: 500 mL via INTRAVENOUS

## 2020-12-13 MED ORDER — SODIUM CHLORIDE 0.9 % IV BOLUS
1000.0000 mL | Freq: Once | INTRAVENOUS | Status: AC
  Administered 2020-12-13: 1000 mL via INTRAVENOUS

## 2020-12-13 NOTE — Discharge Instructions (Addendum)
Clean your laceration to your leg twice a day with soap and water.  Stop taking your [name] if you have any problems before you are seen on August 16 by your cardiologist then you should just get rechecked.

## 2020-12-13 NOTE — ED Notes (Signed)
Pts wound to left lower leg was cleansed and redressed with xeroform and curlex.   Discharge instructions dicussed with pt. With no other questions at this time. Pt to go home with husband.

## 2020-12-13 NOTE — ED Notes (Signed)
Pt not in room.

## 2020-12-13 NOTE — ED Provider Notes (Signed)
[facility] EMERGENCY DEPARTMENT Provider Note   CSN: [id number] Arrival date & time: 12/13/20  1813     History Chief Complaint  Patient presents with   Erin [name] Erin [name] is a 60 y.o. female.  Patient states that her left side got a little bit numb when she fell down 4 steps and scraped her leg.  This has happened before.  She no longer has the numbness.  This occurred 3 days ago.  Patient has a history of cardiomyopathy with heart failure and runs a low blood pressure  The history is provided by the patient and medical records. No language interpreter was used.  Fall This is a recurrent problem. Episode onset: 3 days. The problem occurs rarely. The problem has been resolved. Pertinent negatives include no chest pain, no abdominal pain and no headaches. Nothing aggravates the symptoms. Nothing relieves the symptoms. She has tried nothing for the symptoms. The treatment provided no relief.      Past Medical History:  Diagnosis Date   Anxiety state 03/25/2016   Basal cell carcinoma of forehead    Brain aneurysm    CHF (congestive heart failure) (HCC)    Coronary artery disease    a. 03/11/16 PCI with DES-->Prox/Mid Cx;  b. 03/14/16 PCI with DES x2-->RCA, EF 30-35%.   Essential hypertension    GERD (gastroesophageal reflux disease)    HFrEF (heart failure with reduced ejection fraction) ([place])    a. 10/2016 Echo: EF 35-40%, Gr1 DD, mild focal basal septal hypertrophy, basal inflat, mid inflat, basal antlat AK. Mid infept/inf/antlat, apical lateral sev HK. Mod MR. mildly reduced RV fxn. Mild TR.   History of pneumonia    Hyperlipidemia    IBS (irritable bowel syndrome)    Ischemic cardiomyopathy    a. 10/2016 Echo: EF 35-40%, Gr1 DD.   Mitral regurgitation    NSTEMI (non-ST elevated myocardial infarction) ([place]) 03/10/2016   Pneumonia 03/2016   Squamous cell cancer of skin of nose    Thrombocytosis 03/26/2016   Tobacco abuse    Trichimoniasis    Wears dentures    Wears  glasses     Patient Active Problem List   Diagnosis Date Noted   Left-sided weakness 10/19/2020   Cough 02/11/2020   Near syncope 12/10/2019   Brain aneurysm 04/15/2019   Dysphagia 02/27/2018   Encounter for screening colonoscopy 02/27/2018   History of Clostridium difficile infection 02/27/2018   Frequent stools 12/20/2017   Chronic combined systolic and diastolic CHF (congestive heart failure) ([place]) 06/20/2016   Hypokalemia    Acute CHF (congestive heart failure) ([place]) 05/16/2016   Acute on chronic systolic CHF (congestive heart failure) ([place]) 05/16/2016   Acute respiratory failure with hypoxia (HCC)    Thrombocytosis 03/26/2016   Cardiomyopathy, ischemic 03/25/2016   Chronic combined systolic and diastolic heart failure ([place]) 03/25/2016   Anxiety state 03/25/2016   Troponin level elevated 03/25/2016   CAD (coronary artery disease), native coronary artery 03/25/2016   Normocytic anemia 03/25/2016   SOB (shortness of breath) 03/24/2016   Lightheadedness 03/17/2016   Hypotension 03/17/2016   Tobacco abuse 03/12/2016   NSTEMI (non-ST elevated myocardial infarction) ([place]) 03/11/2016   Atypical chest pain    Essential hypertension 09/06/2015   Mixed hyperlipidemia 09/06/2015   GERD (gastroesophageal reflux disease) 09/06/2015   Chest pain 09/06/2015    Past Surgical History:  Procedure Laterality Date   APPENDECTOMY     BIOPSY  09/20/2018   Procedure: BIOPSY;  Surgeon: Manus Rudd  M, MD;  [facility];  Service: Endoscopy;;  colon   CARDIAC CATHETERIZATION N/A 03/11/2016   Procedure: Left Heart Cath and Coronary Angiography;  Surgeon: Leonie Man, MD;  Location: [place] [facility];  Service: Cardiovascular;  Laterality: N/A;   CARDIAC CATHETERIZATION N/A 03/11/2016   Procedure: Coronary Stent Intervention;  Surgeon: Leonie Man, MD;  Location: [place] [facility];  Service: Cardiovascular;  Laterality: N/A;   CARDIAC CATHETERIZATION N/A 03/14/2016    Procedure: Coronary Stent Intervention;  Surgeon: Peter M Martinique, MD;  Location: [place] [facility];  Service: Cardiovascular;  Laterality: N/A;   CHOLECYSTECTOMY OPEN  1984   COLONOSCOPY WITH PROPOFOL N/A 09/20/2018   Procedure: COLONOSCOPY WITH PROPOFOL;  Surgeon: Daneil Dolin, MD;  [facility];  Service: Endoscopy;  Laterality: N/A;  10:30am   CORONARY ANGIOPLASTY WITH STENT PLACEMENT  03/14/2016   ESOPHAGOGASTRODUODENOSCOPY (EGD) WITH PROPOFOL N/A 09/20/2018   Procedure: ESOPHAGOGASTRODUODENOSCOPY (EGD) WITH PROPOFOL;  Surgeon: Daneil Dolin, MD;  [facility];  Service: Endoscopy;  Laterality: N/A;   FINGER ARTHROPLASTY Left 05/14/2013   Procedure: LEFT THUMB CARPAL METACARPAL ARTHROPLASTY;  Surgeon: Tennis Must, MD;  Location: [place];  Service: Orthopedics;  Laterality: Left;   ICD IMPLANT N/A 04/03/2020   Procedure: ICD IMPLANT;  Surgeon: Constance Haw, MD;  Location: [place] [facility];  Service: Cardiovascular;  Laterality: N/A;   IR ANGIO INTRA EXTRACRAN SEL COM CAROTID INNOMINATE BILAT MOD SED  01/05/2017   IR ANGIO INTRA EXTRACRAN SEL COM CAROTID INNOMINATE BILAT MOD SED  03/19/2019   IR ANGIO INTRA EXTRACRAN SEL COM CAROTID INNOMINATE BILAT MOD SED  06/04/2020   IR ANGIO INTRA EXTRACRAN SEL INTERNAL CAROTID UNI L MOD SED  04/15/2019   IR ANGIO VERTEBRAL SEL VERTEBRAL BILAT MOD SED  01/05/2017   IR ANGIO VERTEBRAL SEL VERTEBRAL BILAT MOD SED  03/19/2019   IR ANGIO VERTEBRAL SEL VERTEBRAL UNI L MOD SED  06/04/2020   IR ANGIOGRAM FOLLOW UP STUDY  04/15/2019   IR RADIOLOGIST EVAL & MGMT  12/30/2016   IR TRANSCATH/EMBOLIZ  04/15/2019   IR US GUIDE VASC ACCESS RIGHT  03/19/2019   IR US GUIDE VASC ACCESS RIGHT  06/04/2020   MALONEY DILATION N/A 09/20/2018   Procedure: Venia Minks DILATION;  Surgeon: Daneil Dolin, MD;  [facility];  Service: Endoscopy;  Laterality: N/A;   RADIOLOGY WITH ANESTHESIA N/A 04/15/2019   Procedure:  [employer];  Surgeon: Luanne Bras, MD;  Location: [place];  Service: Radiology;  Laterality: N/A;   RIGHT/LEFT HEART CATH AND CORONARY ANGIOGRAPHY N/A 08/19/2019   Procedure: RIGHT/LEFT HEART CATH AND CORONARY ANGIOGRAPHY;  Surgeon: Larey Dresser, MD;  Location: [place] [facility];  Service: Cardiovascular;  Laterality: N/A;   TUBAL LIGATION  1987   VAGINAL HYSTERECTOMY  2009     OB History     Gravida  4   Para  4   Term  4   Preterm      AB      Living  4      SAB      IAB      Ectopic      Multiple      Live Births              Family History  Problem Relation Age of Onset   Diabetes Father    Hypertension Father    CAD Father  Colon polyps Father 15       pre-cancerous    Stroke Father    Dementia Father    Stroke Mother    Hypertension Mother    Diabetes Mother    Heart failure Other    Breast cancer Maternal Grandmother    Colon cancer Neg Hx     Social History   Tobacco Use   Smoking status: Some Days    Packs/day: 0.10    Years: 15.00    Pack years: 1.50    Types: Cigarettes   Smokeless tobacco: Never   Tobacco comments:    smokes a cigarette occasionally  Vaping Use   Vaping Use: Never used  Substance Use Topics   Alcohol use: Yes    Comment: occasionally   Drug use: Not Currently    Types: Marijuana    Comment: former- 2017 last time    Home Medications Prior to Admission medications   Medication Sig Start Date End Date Taking? Authorizing Provider  acetaminophen (TYLENOL) 325 MG tablet Take 650 mg by mouth every 6 (six) hours as needed for mild pain or headache.   Yes [provider]  ALPRAZolam Duanne Moron) 1 MG tablet Take 1-2 mg by mouth at bedtime.   Yes [provider]  aspirin EC 81 MG tablet Take 81 mg by mouth every evening.    Yes [provider]  atorvastatin (LIPITOR) 80 MG tablet Take 1 tablet (80 mg total) by mouth daily at 6 PM. 11/10/20  Yes Larey Dresser, MD  carvedilol  (COREG) 6.25 MG tablet Take 0.5 tablets (3.125 mg total) by mouth 2 (two) times daily. 11/13/20  Yes Larey Dresser, MD  Cholecalciferol (VITAMIN D3) 10 MCG (400 UNIT) CAPS Take 1 capsule by mouth daily.   Yes [provider]  clopidogrel (PLAVIX) 75 MG tablet Take 1 tablet (75 mg total) by mouth daily. 10/27/20  Yes Louk, Alexandra M, PA-C  dapagliflozin propanediol (FARXIGA) 10 MG TABS tablet Take 1 tablet (10 mg total) by mouth daily before breakfast 11/10/20  Yes Larey Dresser, MD  ezetimibe (ZETIA) 10 MG tablet Take 1 tablet (10 mg total) by mouth daily. 07/07/20  Yes Larey Dresser, MD  furosemide (LASIX) 20 MG tablet Take 2 tablets (40 mg total) by mouth daily. 11/10/20 11/10/21 Yes Larey Dresser, MD  gabapentin (NEURONTIN) 100 MG capsule Take 200 mg by mouth at bedtime.   Yes [provider]  loperamide (IMODIUM) 2 MG capsule Take 2 mg by mouth 4 (four) times daily as needed for diarrhea or loose stools (ibs).    Yes [provider]  LORazepam (ATIVAN) 1 MG tablet Take 0.5-1 tablets (0.5-1 mg total) by mouth daily. 09/15/20  Yes   Multiple Vitamins-Minerals (MULTIVITAMIN WITH MINERALS) tablet Take 1 tablet by mouth daily.   Yes [provider]  nitroGLYCERIN (NITROSTAT) 0.4 MG SL tablet Place 1 tablet (0.4 mg total) under the tongue every 5 (five) minutes x 3 doses as needed for chest pain (if no relief after 2nd dose, proceed to the ED for an evaluation or call 911). 09/01/20  Yes Verta Ellen., NP  rOPINIRole (REQUIP) 0.5 MG tablet Take 0.5 mg by mouth at bedtime.  11/13/17  Yes [provider]  sacubitril-valsartan (ENTRESTO) 97-103 MG Take 1 tablet by mouth 2 (two) times daily. 08/17/20  Yes Clegg, Amy D, NP  spironolactone (ALDACTONE) 25 MG tablet Take 1/2 (one-half) tablet by mouth once daily Patient taking differently: Take  12.5 mg by mouth daily. 12/10/20  Yes Larey Dresser, MD  omeprazole (PRILOSEC) 20 MG capsule Take 20 mg by mouth daily.   Patient not taking: Reported on 12/13/2020    [provider]  zinc gluconate 50 MG tablet Take 50 mg by mouth daily. Patient not taking: Reported on 12/13/2020    [provider]    Allergies    Tape  Review of Systems   Review of Systems  Constitutional:  Negative for appetite change and fatigue.  HENT:  Negative for congestion, ear discharge and sinus pressure.   Eyes:  Negative for discharge.  Respiratory:  Negative for cough.   Cardiovascular:  Negative for chest pain.  Gastrointestinal:  Negative for abdominal pain and diarrhea.  Genitourinary:  Negative for frequency and hematuria.  Musculoskeletal:  Negative for back pain.       Abrasion and tenderness to left lower leg  Skin:  Negative for rash.  Neurological:  Negative for seizures and headaches.  Psychiatric/Behavioral:  Negative for hallucinations.    Physical Exam Updated Vital Signs BP (!) 92/50   Pulse 91   Temp 97.9 F (36.6 C) (Oral)   Resp (!) 24   Ht 5' (1.524 m)   Wt 52.6 kg   SpO2 100%   BMI 22.65 kg/m   Physical Exam Vitals and nursing note reviewed.  Constitutional:      Appearance: She is well-developed.  HENT:     Head: Normocephalic.     Nose: Nose normal.  Eyes:     General: No scleral icterus.    Conjunctiva/sclera: Conjunctivae normal.  Neck:     Thyroid: No thyromegaly.  Cardiovascular:     Rate and Rhythm: Normal rate and regular rhythm.     Heart sounds: No murmur heard.   No friction rub. No gallop.  Pulmonary:     Breath sounds: No stridor. No wheezing or rales.  Chest:     Chest wall: No tenderness.  Abdominal:     General: There is no distension.     Tenderness: There is no abdominal tenderness. There is no rebound.  Musculoskeletal:        General: Normal range of motion.     Cervical back: Neck supple.     Comments: Abrasion and tenderness left lower leg  Lymphadenopathy:     Cervical: No cervical adenopathy.  Skin:    Findings: No erythema or  rash.  Neurological:     Mental Status: She is alert and oriented to person, place, and time.     Motor: No abnormal muscle tone.     Coordination: Coordination normal.  Psychiatric:        Behavior: Behavior normal.    ED Results / Procedures / Treatments   Labs (all labs ordered are listed, but only abnormal results are displayed) Labs Reviewed  CBC WITH DIFFERENTIAL/PLATELET - Abnormal; Notable for the following components:      Result Value   Hemoglobin 11.6 (*)    HCT 35.5 (*)    All other components within normal limits  COMPREHENSIVE METABOLIC PANEL - Abnormal; Notable for the following components:   Potassium 3.1 (*)    Glucose, Bld 107 (*)    All other components within normal limits    EKG None  Radiology DG Chest 2 View  Result Date: 12/13/2020 CLINICAL DATA:  Fall EXAM: CHEST - 2 VIEW COMPARISON:  09/05/2020 FINDINGS: Left AICD remains in place, unchanged. Heart and mediastinal contours are within normal  limits. No focal opacities or effusions. No acute bony abnormality. IMPRESSION: No active cardiopulmonary disease. Electronically Signed   By: Rolm Baptise M.D.   On: 12/13/2020 20:44   DG Tibia/Fibula Left  Result Date: 12/13/2020 CLINICAL DATA:  Fall down stairs EXAM: LEFT TIBIA AND FIBULA - 2 VIEW COMPARISON:  None. FINDINGS: There is no evidence of fracture or other focal bone lesions. Soft tissues are unremarkable. IMPRESSION: Negative. Electronically Signed   By: Rolm Baptise M.D.   On: 12/13/2020 20:44    Procedures Procedures   Medications Ordered in ED Medications  sodium chloride 0.9 % bolus 500 mL (0 mLs Intravenous Stopped 12/13/20 2149)  sodium chloride 0.9 % bolus 1,000 mL (1,000 mLs Intravenous New Bag/Given 12/13/20 2245)    ED Course  I have reviewed the triage vital signs and the nursing notes.  Pertinent labs & imaging results that were available during my care of the patient were reviewed by me and considered in my medical decision making (see  chart for details).    MDM Rules/Calculators/A&P                           Patient with fall and contusion and abrasion to left lower leg.  Also patient with mild dehydration and hypotension.  Patient has no symptoms with this.  She was hydrated with 2 L of fluid and will stop taking her Entresto for the next week until she sees her cardiologist Final Clinical Impression(s) / ED Diagnoses Final diagnoses:  Fall, initial encounter  Hypotension due to drugs    Rx / DC Orders ED Discharge Orders     None        Milton Ferguson, MD 12/20/20 [phone]

## 2020-12-13 NOTE — ED Triage Notes (Signed)
Pt reports infrequent intermittent "numbness" to left leg since 2017. Pt was fell downstairs Friday d/t sudden "numbness" to left side Symptoms resolve "within 5 minutes." Denies any other neuro symptoms. Currently denies numbness. Abrasion to left leg bandaged prior to arrival. Pt reports using blood thinners.

## 2020-12-14 ENCOUNTER — Ambulatory Visit (INDEPENDENT_AMBULATORY_CARE_PROVIDER_SITE_OTHER): Payer: 59

## 2020-12-14 DIAGNOSIS — Z95810 Presence of automatic (implantable) cardiac defibrillator: Secondary | ICD-10-CM

## 2020-12-14 DIAGNOSIS — I5042 Chronic combined systolic (congestive) and diastolic (congestive) heart failure: Secondary | ICD-10-CM

## 2020-12-15 ENCOUNTER — Telehealth (HOSPITAL_COMMUNITY): Payer: Self-pay

## 2020-12-15 ENCOUNTER — Telehealth: Payer: Self-pay

## 2020-12-15 NOTE — Telephone Encounter (Signed)
Spoke to pt's daughter. Agreed to f/u in 6 months with cta head/neck. AW

## 2020-12-15 NOTE — Telephone Encounter (Signed)
Remote ICM transmission received.  Attempted call to patient regarding ICM remote transmission and no answer or voice mail option.  

## 2020-12-15 NOTE — Progress Notes (Signed)
EPIC Encounter for ICM Monitoring  Patient Name: Erin [name] is a 60 y.o. female Date: 12/15/2020 Primary Care Physican: [name], MD Primary Cardiologist: Aundra Dubin Electrophysiologist: Curt Bears 09/28/2020 Weight: 116 lbs  10/06/2020 Weight: 119 lbs                                                           Attempted call to patient and unable to reach.  Left detailed message per DPR regarding transmission. Transmission reviewed.  Dr Claris Gladden 8/4 note advised to Increase Lasix to 40 qam/20 qpm x 3 days then 40 qam and 20 mg every other afternoon after that   Optivol thoracic impedance normal but was suggesting possible fluid accumulation from 7/14-8/4.   Prescribed: Furosemide 20 mg Take 2 tablets (40 mg total) by mouth daily. Spironolactone 25 mg take 0.5 tablet (12.5 mg total) daily.   Labs: 12/13/2020 Creatinine 0.84, BUN 16, Potassium 3.1, Sodium 138, GFR >60 12/10/2020 Creatinine 1.07, BUN 16, Potassium 3.7, Sodium 137, GFR >60  10/22/2020 Creatinine 1.00, BUN 11, Potassium 3.5, Sodium 139, GFR >60  10/21/2020 Creatinine 0.87, BUN 10, Potassium 3.8, Sodium 138, GFR >60  10/19/2020 Creatinine 1.22, BUN 21, Potassium 4.0, Sodium 136 10/07/2020 Creatinine 1.03, BUN 18, Potassium 4.4, Sodium 142, GFR 63  A complete set of results can be found in Results Review.   Recommendations: Unable to reach.   Follow-up plan: ICM clinic phone appointment on 01/18/2021.   91 day device clinic remote transmission 01/04/2021.     EP/Cardiology Office Visits:  03/12/2021 with Dr Aundra Dubin   Copy of ICM check sent to Dr. Curt Bears.     3 month ICM trend: 12/14/2020.    1 Year ICM trend:       Rosalene Billings, RN 12/15/2020 4:56 PM

## 2020-12-15 NOTE — Telephone Encounter (Signed)
[place] from Rushie Nyhan, NP: Hello Dr. Keturah Barre. would like a 6 month CTA head and neck on this patient. Thank you

## 2020-12-16 ENCOUNTER — Other Ambulatory Visit (HOSPITAL_COMMUNITY): Payer: Self-pay

## 2020-12-18 ENCOUNTER — Other Ambulatory Visit (HOSPITAL_COMMUNITY): Payer: Self-pay

## 2020-12-21 ENCOUNTER — Other Ambulatory Visit: Payer: Self-pay

## 2020-12-21 ENCOUNTER — Ambulatory Visit (HOSPITAL_COMMUNITY)
Admission: RE | Admit: 2020-12-21 | Discharge: 2020-12-21 | Disposition: A | Discharge status: Home / Self Care | Payer: 59 | Source: Ambulatory Visit | Attending: Family Medicine | Admitting: Family Medicine

## 2020-12-21 DIAGNOSIS — Z1231 Encounter for screening mammogram for malignant neoplasm of breast: Secondary | ICD-10-CM | POA: Insufficient documentation

## 2020-12-22 ENCOUNTER — Ambulatory Visit (HOSPITAL_COMMUNITY)
Admission: RE | Admit: 2020-12-22 | Discharge: 2020-12-22 | Disposition: A | Discharge status: Home / Self Care | Payer: 59 | Source: Ambulatory Visit | Attending: Cardiology | Admitting: Cardiology

## 2020-12-22 ENCOUNTER — Other Ambulatory Visit: Payer: Self-pay

## 2020-12-22 DIAGNOSIS — I5042 Chronic combined systolic (congestive) and diastolic (congestive) heart failure: Secondary | ICD-10-CM | POA: Diagnosis present

## 2020-12-22 LAB — BASIC METABOLIC PANEL
Anion gap: 10 (ref 5–15)
BUN: 14 mg/dL (ref 6–20)
CO2: 27 mmol/L (ref 22–32)
Calcium: 9.8 mg/dL (ref 8.9–10.3)
Chloride: 102 mmol/L (ref 98–111)
Creatinine, Ser: 0.97 mg/dL (ref 0.44–1.00)
GFR, Estimated: >60 mL/min (ref >60)
Glucose, Bld: 108 mg/dL — ABNORMAL HIGH (ref 70–99)
Potassium: 3.6 mmol/L (ref 3.5–5.1)
Sodium: 139 mmol/L (ref 135–145)

## 2021-01-02 ENCOUNTER — Emergency Department (HOSPITAL_COMMUNITY)
Admission: EM | Admit: 2021-01-02 | Discharge: 2021-01-02 | Disposition: A | Discharge status: Home / Self Care | Payer: 59 | Attending: Emergency Medicine | Admitting: Emergency Medicine

## 2021-01-02 ENCOUNTER — Emergency Department (HOSPITAL_COMMUNITY): Payer: 59

## 2021-01-02 ENCOUNTER — Other Ambulatory Visit: Payer: Self-pay

## 2021-01-02 ENCOUNTER — Encounter (HOSPITAL_COMMUNITY): Payer: Self-pay | Admitting: Emergency Medicine

## 2021-01-02 DIAGNOSIS — Z96692 Finger-joint replacement of left hand: Secondary | ICD-10-CM | POA: Insufficient documentation

## 2021-01-02 DIAGNOSIS — R252 Cramp and spasm: Secondary | ICD-10-CM | POA: Diagnosis not present

## 2021-01-02 DIAGNOSIS — R1084 Generalized abdominal pain: Secondary | ICD-10-CM | POA: Insufficient documentation

## 2021-01-02 DIAGNOSIS — R1110 Vomiting, unspecified: Secondary | ICD-10-CM | POA: Diagnosis not present

## 2021-01-02 DIAGNOSIS — Z85828 Personal history of other malignant neoplasm of skin: Secondary | ICD-10-CM | POA: Diagnosis not present

## 2021-01-02 DIAGNOSIS — R197 Diarrhea, unspecified: Secondary | ICD-10-CM | POA: Diagnosis not present

## 2021-01-02 DIAGNOSIS — R0602 Shortness of breath: Secondary | ICD-10-CM | POA: Diagnosis not present

## 2021-01-02 DIAGNOSIS — I2510 Atherosclerotic heart disease of native coronary artery without angina pectoris: Secondary | ICD-10-CM | POA: Diagnosis not present

## 2021-01-02 DIAGNOSIS — R42 Dizziness and giddiness: Secondary | ICD-10-CM | POA: Diagnosis not present

## 2021-01-02 DIAGNOSIS — F17210 Nicotine dependence, cigarettes, uncomplicated: Secondary | ICD-10-CM | POA: Diagnosis not present

## 2021-01-02 DIAGNOSIS — Z79899 Other long term (current) drug therapy: Secondary | ICD-10-CM | POA: Insufficient documentation

## 2021-01-02 DIAGNOSIS — Z955 Presence of coronary angioplasty implant and graft: Secondary | ICD-10-CM | POA: Diagnosis not present

## 2021-01-02 DIAGNOSIS — R509 Fever, unspecified: Secondary | ICD-10-CM | POA: Diagnosis not present

## 2021-01-02 DIAGNOSIS — Z20822 Contact with and (suspected) exposure to covid-19: Secondary | ICD-10-CM | POA: Diagnosis not present

## 2021-01-02 DIAGNOSIS — I5042 Chronic combined systolic (congestive) and diastolic (congestive) heart failure: Secondary | ICD-10-CM | POA: Insufficient documentation

## 2021-01-02 DIAGNOSIS — Z7982 Long term (current) use of aspirin: Secondary | ICD-10-CM | POA: Insufficient documentation

## 2021-01-02 DIAGNOSIS — R519 Headache, unspecified: Secondary | ICD-10-CM | POA: Diagnosis not present

## 2021-01-02 DIAGNOSIS — K219 Gastro-esophageal reflux disease without esophagitis: Secondary | ICD-10-CM | POA: Diagnosis not present

## 2021-01-02 DIAGNOSIS — R079 Chest pain, unspecified: Secondary | ICD-10-CM | POA: Insufficient documentation

## 2021-01-02 DIAGNOSIS — I110 Hypertensive heart disease with heart failure: Secondary | ICD-10-CM | POA: Insufficient documentation

## 2021-01-02 LAB — URINALYSIS, ROUTINE W REFLEX MICROSCOPIC
Bacteria, UA: NONE SEEN
Bilirubin Urine: NEGATIVE
Glucose, UA: >=500 mg/dL — AB
Hgb urine dipstick: NEGATIVE
Ketones, ur: 5 mg/dL — AB
Leukocytes,Ua: NEGATIVE
Nitrite: NEGATIVE
Protein, ur: NEGATIVE mg/dL
Specific Gravity, Urine: 1.015 (ref 1.005–1.030)
pH: 5 (ref 5.0–8.0)

## 2021-01-02 LAB — COMPREHENSIVE METABOLIC PANEL
ALT: 18 U/L (ref 0–44)
AST: 24 U/L (ref 15–41)
Albumin: 4.3 g/dL (ref 3.5–5.0)
Alkaline Phosphatase: 76 U/L (ref 38–126)
Anion gap: 8 (ref 5–15)
BUN: 17 mg/dL (ref 6–20)
CO2: 26 mmol/L (ref 22–32)
Calcium: 9.9 mg/dL (ref 8.9–10.3)
Chloride: 102 mmol/L (ref 98–111)
Creatinine, Ser: 1 mg/dL (ref 0.44–1.00)
GFR, Estimated: >60 mL/min (ref >60)
Glucose, Bld: 101 mg/dL — ABNORMAL HIGH (ref 70–99)
Potassium: 3.7 mmol/L (ref 3.5–5.1)
Sodium: 136 mmol/L (ref 135–145)
Total Bilirubin: 1.4 mg/dL — ABNORMAL HIGH (ref 0.3–1.2)
Total Protein: 7.6 g/dL (ref 6.5–8.1)

## 2021-01-02 LAB — CBC WITH DIFFERENTIAL/PLATELET
Abs Immature Granulocytes: 0.03 10*3/uL (ref 0.00–0.07)
Basophils Absolute: 0.1 10*3/uL (ref 0.0–0.1)
Basophils Relative: 1 %
Eosinophils Absolute: 0.1 10*3/uL (ref 0.0–0.5)
Eosinophils Relative: 1 %
HCT: 40.8 % (ref 36.0–46.0)
Hemoglobin: 13.6 g/dL (ref 12.0–15.0)
Immature Granulocytes: 0 %
Lymphocytes Relative: 29 %
Lymphs Abs: 2.4 10*3/uL (ref 0.7–4.0)
MCH: 29.3 pg (ref 26.0–34.0)
MCHC: 33.3 g/dL (ref 30.0–36.0)
MCV: 87.9 fL (ref 80.0–100.0)
Monocytes Absolute: 0.7 10*3/uL (ref 0.1–1.0)
Monocytes Relative: 8 %
Neutro Abs: 4.9 10*3/uL (ref 1.7–7.7)
Neutrophils Relative %: 61 %
Platelets: 395 10*3/uL (ref 150–400)
RBC: 4.64 MIL/uL (ref 3.87–5.11)
RDW: 14.6 % (ref 11.5–15.5)
WBC: 8.1 10*3/uL (ref 4.0–10.5)
nRBC: 0 % (ref 0.0–0.2)

## 2021-01-02 LAB — LIPASE, BLOOD: Lipase: 53 U/L — ABNORMAL HIGH (ref 11–51)

## 2021-01-02 LAB — TROPONIN I (HIGH SENSITIVITY)
Troponin I (High Sensitivity): 5 ng/L (ref <18)
Troponin I (High Sensitivity): 7 ng/L (ref <18)

## 2021-01-02 MED ORDER — SODIUM CHLORIDE 0.9 % IV BOLUS
500.0000 mL | Freq: Once | INTRAVENOUS | Status: AC
  Administered 2021-01-02: 500 mL via INTRAVENOUS

## 2021-01-02 MED ORDER — ONDANSETRON HCL 4 MG/2ML IJ SOLN
4.0000 mg | Freq: Once | INTRAMUSCULAR | Status: AC
  Administered 2021-01-02: 4 mg via INTRAVENOUS
  Filled 2021-01-02: qty 2

## 2021-01-02 MED ORDER — PANTOPRAZOLE SODIUM 40 MG IV SOLR
40.0000 mg | Freq: Once | INTRAVENOUS | Status: AC
  Administered 2021-01-02: 40 mg via INTRAVENOUS
  Filled 2021-01-02: qty 40

## 2021-01-02 MED ORDER — ONDANSETRON 4 MG PO TBDP: 4.0000 mg | ORAL_TABLET | Freq: Three times a day (TID) | ORAL | 0 refills | Status: DC | PRN

## 2021-01-02 NOTE — ED Triage Notes (Signed)
Patient c/o nausea, vomiting, and diarrhea that started on Thursday after eating a burger. Per patient unable to eat meet since having 3 ticks removed from in July. Patient states low-grade temp of 99.7. Per patient started to have mid-sternal chest pain that started this morning. Patient does have hx of MI with stents and defibrillator in place. Also hx of CHF. Denies any swelling in extremities. Does report cramping of muscles and shortness of breath with exertion.

## 2021-01-02 NOTE — ED Provider Notes (Signed)
[facility] EMERGENCY DEPARTMENT Provider Note   CSN: [id number] Arrival date & time: 01/02/21  [id number]     History Chief Complaint  Patient presents with   Vomiting    Erin [name] is a 60 y.o. female history of ischemic cardiomyopathy, NSTEMI, CHF, GERD and presents emergency department for evaluation of vomiting over the last 2 days.  She states that she had Erin [name] couple of days ago and began vomiting with associated diarrhea and abdominal pain shortly after.  She states that she has been unable to have red meat or pork since July when she got bit by 3 ticks.  No previous episodes prior unable to keep anything down.  She reports that she generalized abdominal pain characterized as a cramping sensation.  It is worse with lying down.  Her abdominal pain was not relieved with Pepto-Bismol.  She does report associated subjective fever, cramps to the lower legs, headache, chest pain, shortness of breath, lightheadedness, and black stools after the Pepto-Bismol.  She denies chills, syncope, or focal weakness/numbness. No vaginal bleeding or dischage. Her chest pain started after the vomiting and does not feel similar to her previous NSTEMI.  Feels like a heavy/burning sensation located to the substernal chest.  The history is provided by the patient. No language interpreter was used.      Past Medical History:  Diagnosis Date   Anxiety state 03/25/2016   Basal cell carcinoma of forehead    Brain aneurysm    CHF (congestive heart failure) (HCC)    Coronary artery disease    a. 03/11/16 PCI with DES-->Prox/Mid Cx;  b. 03/14/16 PCI with DES x2-->RCA, EF 30-35%.   Essential hypertension    GERD (gastroesophageal reflux disease)    HFrEF (heart failure with reduced ejection fraction) ([place])    a. 10/2016 Echo: EF 35-40%, Gr1 DD, mild focal basal septal hypertrophy, basal inflat, mid inflat, basal antlat AK. Mid infept/inf/antlat, apical lateral sev HK. Mod MR. mildly reduced RV fxn. Mild TR.    History of pneumonia    Hyperlipidemia    IBS (irritable bowel syndrome)    Ischemic cardiomyopathy    a. 10/2016 Echo: EF 35-40%, Gr1 DD.   Mitral regurgitation    NSTEMI (non-ST elevated myocardial infarction) ([place]) 03/10/2016   Pneumonia 03/2016   Squamous cell cancer of skin of nose    Thrombocytosis 03/26/2016   Tobacco abuse    Trichimoniasis    Wears dentures    Wears glasses     Patient Active Problem List   Diagnosis Date Noted   Left-sided weakness 10/19/2020   Cough 02/11/2020   Near syncope 12/10/2019   Brain aneurysm 04/15/2019   Dysphagia 02/27/2018   Encounter for screening colonoscopy 02/27/2018   History of Clostridium difficile infection 02/27/2018   Frequent stools 12/20/2017   Chronic combined systolic and diastolic CHF (congestive heart failure) ([place]) 06/20/2016   Hypokalemia    Acute CHF (congestive heart failure) ([place]) 05/16/2016   Acute on chronic systolic CHF (congestive heart failure) ([place]) 05/16/2016   Acute respiratory failure with hypoxia (HCC)    Thrombocytosis 03/26/2016   Cardiomyopathy, ischemic 03/25/2016   Chronic combined systolic and diastolic heart failure ([place]) 03/25/2016   Anxiety state 03/25/2016   Troponin level elevated 03/25/2016   CAD (coronary artery disease), native coronary artery 03/25/2016   Normocytic anemia 03/25/2016   SOB (shortness of breath) 03/24/2016   Lightheadedness 03/17/2016   Hypotension 03/17/2016   Tobacco abuse 03/12/2016   NSTEMI (non-ST elevated myocardial  infarction) ([place]) 03/11/2016   Atypical chest pain    Essential hypertension 09/06/2015   Mixed hyperlipidemia 09/06/2015   GERD (gastroesophageal reflux disease) 09/06/2015   Chest pain 09/06/2015    Past Surgical History:  Procedure Laterality Date   APPENDECTOMY     BIOPSY  09/20/2018   Procedure: BIOPSY;  Surgeon: Daneil Dolin, MD;  [facility];  Service: Endoscopy;;  colon   CARDIAC CATHETERIZATION N/A 03/11/2016    Procedure: Left Heart Cath and Coronary Angiography;  Surgeon: Leonie Man, MD;  Location: [place] [facility];  Service: Cardiovascular;  Laterality: N/A;   CARDIAC CATHETERIZATION N/A 03/11/2016   Procedure: Coronary Stent Intervention;  Surgeon: Leonie Man, MD;  Location: [place] [facility];  Service: Cardiovascular;  Laterality: N/A;   CARDIAC CATHETERIZATION N/A 03/14/2016   Procedure: Coronary Stent Intervention;  Surgeon: Peter M Martinique, MD;  Location: [place] [facility];  Service: Cardiovascular;  Laterality: N/A;   CHOLECYSTECTOMY OPEN  1984   COLONOSCOPY WITH PROPOFOL N/A 09/20/2018   Procedure: COLONOSCOPY WITH PROPOFOL;  Surgeon: Daneil Dolin, MD;  [facility];  Service: Endoscopy;  Laterality: N/A;  10:30am   CORONARY ANGIOPLASTY WITH STENT PLACEMENT  03/14/2016   ESOPHAGOGASTRODUODENOSCOPY (EGD) WITH PROPOFOL N/A 09/20/2018   Procedure: ESOPHAGOGASTRODUODENOSCOPY (EGD) WITH PROPOFOL;  Surgeon: Daneil Dolin, MD;  [facility];  Service: Endoscopy;  Laterality: N/A;   FINGER ARTHROPLASTY Left 05/14/2013   Procedure: LEFT THUMB CARPAL METACARPAL ARTHROPLASTY;  Surgeon: Tennis Must, MD;  Location: [place];  Service: Orthopedics;  Laterality: Left;   ICD IMPLANT N/A 04/03/2020   Procedure: ICD IMPLANT;  Surgeon: Constance Haw, MD;  Location: [place] [facility];  Service: Cardiovascular;  Laterality: N/A;   IR ANGIO INTRA EXTRACRAN SEL COM CAROTID INNOMINATE BILAT MOD SED  01/05/2017   IR ANGIO INTRA EXTRACRAN SEL COM CAROTID INNOMINATE BILAT MOD SED  03/19/2019   IR ANGIO INTRA EXTRACRAN SEL COM CAROTID INNOMINATE BILAT MOD SED  06/04/2020   IR ANGIO INTRA EXTRACRAN SEL INTERNAL CAROTID UNI L MOD SED  04/15/2019   IR ANGIO VERTEBRAL SEL VERTEBRAL BILAT MOD SED  01/05/2017   IR ANGIO VERTEBRAL SEL VERTEBRAL BILAT MOD SED  03/19/2019   IR ANGIO VERTEBRAL SEL VERTEBRAL UNI L MOD SED  06/04/2020   IR ANGIOGRAM FOLLOW UP STUDY  04/15/2019    IR RADIOLOGIST EVAL & MGMT  12/30/2016   IR TRANSCATH/EMBOLIZ  04/15/2019   IR US GUIDE VASC ACCESS RIGHT  03/19/2019   IR US GUIDE VASC ACCESS RIGHT  06/04/2020   MALONEY DILATION N/A 09/20/2018   Procedure: Venia Minks DILATION;  Surgeon: Daneil Dolin, MD;  [facility];  Service: Endoscopy;  Laterality: N/A;   RADIOLOGY WITH ANESTHESIA N/A 04/15/2019   Procedure: [employer];  Surgeon: Luanne Bras, MD;  Location: [place];  Service: Radiology;  Laterality: N/A;   RIGHT/LEFT HEART CATH AND CORONARY ANGIOGRAPHY N/A 08/19/2019   Procedure: RIGHT/LEFT HEART CATH AND CORONARY ANGIOGRAPHY;  Surgeon: Larey Dresser, MD;  Location: [place] [facility];  Service: Cardiovascular;  Laterality: N/A;   TUBAL LIGATION  1987   VAGINAL HYSTERECTOMY  2009     OB History     Gravida  4   Para  4   Term  4   Preterm      AB      Living  4      SAB  IAB      Ectopic      Multiple      Live Births              Family History  Problem Relation Age of Onset   Diabetes Father    Hypertension Father    CAD Father    Colon polyps Father 38       pre-cancerous    Stroke Father    Dementia Father    Stroke Mother    Hypertension Mother    Diabetes Mother    Heart failure Other    Breast cancer Maternal Grandmother    Colon cancer Neg Hx     Social History   Tobacco Use   Smoking status: Some Days    Packs/day: 0.10    Years: 15.00    Pack years: 1.50    Types: Cigarettes   Smokeless tobacco: Never   Tobacco comments:    smokes a cigarette occasionally  Vaping Use   Vaping Use: Never used  Substance Use Topics   Alcohol use: Yes    Comment: occasionally   Drug use: Not Currently    Types: Marijuana    Comment: former- 2017 last time    Home Medications Prior to Admission medications   Medication Sig Start Date End Date Taking? Authorizing Provider  acetaminophen (TYLENOL) 325 MG tablet Take 650 mg by mouth every 6 (six) hours as needed for  mild pain or headache.    [provider]  ALPRAZolam Duanne Moron) 1 MG tablet Take 1-2 mg by mouth at bedtime.    [provider]  aspirin EC 81 MG tablet Take 81 mg by mouth every evening.     [provider]  atorvastatin (LIPITOR) 80 MG tablet Take 1 tablet (80 mg total) by mouth daily at 6 PM. 11/10/20   Larey Dresser, MD  carvedilol (COREG) 6.25 MG tablet Take 0.5 tablets (3.125 mg total) by mouth 2 (two) times daily. 11/13/20   Larey Dresser, MD  Cholecalciferol (VITAMIN D3) 10 MCG (400 UNIT) CAPS Take 1 capsule by mouth daily.    [provider]  clopidogrel (PLAVIX) 75 MG tablet Take 1 tablet (75 mg total) by mouth daily. 10/27/20   Louk, Bea Graff, PA-C  dapagliflozin propanediol (FARXIGA) 10 MG TABS tablet Take 1 tablet (10 mg total) by mouth daily before breakfast 11/10/20   Larey Dresser, MD  ezetimibe (ZETIA) 10 MG tablet Take 1 tablet (10 mg total) by mouth daily. 07/07/20   Larey Dresser, MD  furosemide (LASIX) 20 MG tablet Take 2 tablets (40 mg total) by mouth daily. 11/10/20 11/10/21  Larey Dresser, MD  gabapentin (NEURONTIN) 100 MG capsule Take 200 mg by mouth at bedtime.    [provider]  loperamide (IMODIUM) 2 MG capsule Take 2 mg by mouth 4 (four) times daily as needed for diarrhea or loose stools (ibs).     [provider]  LORazepam (ATIVAN) 1 MG tablet Take 0.5-1 tablets (0.5-1 mg total) by mouth daily. 09/15/20     Multiple Vitamins-Minerals (MULTIVITAMIN WITH MINERALS) tablet Take 1 tablet by mouth daily.    [provider]  nitroGLYCERIN (NITROSTAT) 0.4 MG SL tablet Place 1 tablet (0.4 mg total) under the tongue every 5 (five) minutes x 3 doses as needed for chest pain (if no relief after 2nd dose, proceed to the ED for an evaluation or call 911). 09/01/20   Verta Ellen., NP  omeprazole (PRILOSEC) 20 MG capsule Take 20 mg by mouth daily.  Patient not taking: Reported on 12/13/2020    [provider]  rOPINIRole (REQUIP) 0.5 MG tablet Take 0.5 mg by mouth at bedtime.  11/13/17   [provider]  sacubitril-valsartan (ENTRESTO) 97-103 MG Take 1 tablet by mouth 2 (two) times daily. 08/17/20   Clegg, Amy D, NP  spironolactone (ALDACTONE) 25 MG tablet Take 1/2 (one-half) tablet by mouth once daily Patient taking differently: Take 12.5 mg by mouth daily. 12/10/20   Larey Dresser, MD  zinc gluconate 50 MG tablet Take 50 mg by mouth daily. Patient not taking: Reported on 12/13/2020    [provider]    Allergies    Tape  Review of Systems   Review of Systems  Constitutional:  Positive for fever. Negative for chills.  Respiratory:  Positive for shortness of breath.   Cardiovascular:  Positive for chest pain.  Gastrointestinal:  Positive for abdominal distention, diarrhea, nausea and vomiting.  Genitourinary:  Negative for difficulty urinating, dysuria, hematuria, vaginal bleeding and vaginal discharge.  Neurological:  Positive for weakness, light-headedness and headaches. Negative for dizziness, syncope and numbness.   Physical Exam Updated Vital Signs BP 115/72 (BP Location: Left Arm)   Pulse 89   Temp 98.4 F (36.9 C) (Oral)   Resp 18   Ht 5' (1.524 m)   Wt 51.7 kg   SpO2 99%   BMI 22.26 kg/m   Physical Exam Constitutional:      Appearance: Normal appearance.  HENT:     Head: Normocephalic and atraumatic.     Mouth/Throat:     Mouth: Mucous membranes are dry.  Eyes:     General:        Right eye: No discharge.        Left eye: No discharge.  Cardiovascular:     Rate and Rhythm: Normal rate and regular rhythm.     Pulses:          Radial pulses are 2+ on the right side and 2+ on the left side.       Dorsalis pedis pulses are 2+ on the right side and 2+ on the left side.     Heart sounds: Normal heart sounds.  Pulmonary:     Effort: Pulmonary effort is normal. No respiratory distress.     Breath sounds: Normal breath sounds.  Abdominal:      General: Abdomen is flat. Bowel sounds are normal. There is no distension.     Palpations: Abdomen is soft.     Tenderness: There is abdominal tenderness in the suprapubic area. There is no guarding or rebound.  Musculoskeletal:     Cervical back: Neck supple.     Right lower leg: No edema.     Left lower leg: No edema.  Skin:    General: Skin is warm and dry.     Comments: No evidence of urticarial rash.  Neurological:     Mental Status: She is alert.  Psychiatric:        Mood and Affect: Mood and affect normal.    ED Results / Procedures / Treatments   Labs (all labs ordered are listed, but only abnormal results are displayed) Labs Reviewed  URINALYSIS, ROUTINE W REFLEX MICROSCOPIC - Abnormal; Notable for the following components:      Result Value   APPearance HAZY (*)    Glucose, UA >=500 (*)    Ketones, ur 5 (*)  All other components within normal limits  COMPREHENSIVE METABOLIC PANEL - Abnormal; Notable for the following components:   Glucose, Bld 101 (*)    Total Bilirubin 1.4 (*)    All other components within normal limits  LIPASE, BLOOD - Abnormal; Notable for the following components:   Lipase 53 (*)    All other components within normal limits  SARS CORONAVIRUS 2 (TAT 6-24 HRS)  CBC WITH DIFFERENTIAL/PLATELET  TROPONIN I (HIGH SENSITIVITY)  TROPONIN I (HIGH SENSITIVITY)    EKG EKG Interpretation  Date/Time:  Saturday January 02 2021 08:20:06 EDT Ventricular Rate:  85 PR Interval:  133 QRS Duration: 92 QT Interval:  368 QTC Calculation: 438 R Axis:   23 Text Interpretation: Sinus rhythm Probable left atrial enlargement Low voltage, extremity leads Baseline wander in lead(s) I aVR No significant change from prior Confirmed by Nanda Quinton [id number] on 01/02/2021 8:33:46 AM  Radiology DG Abdomen Acute W/Chest  Result Date: 01/02/2021 CLINICAL DATA:  Abdominal pain, diarrhea. EXAM: DG ABDOMEN ACUTE WITH 1 VIEW CHEST COMPARISON:  October 23, 2018. FINDINGS:  There is no evidence of dilated bowel loops or free intraperitoneal air. No radiopaque calculi or other significant radiographic abnormality is seen. Heart size and mediastinal contours are within normal limits. Both lungs are clear. IMPRESSION: Negative abdominal radiographs.  No acute cardiopulmonary disease. Electronically Signed   By: Marijo Conception M.D.   On: 01/02/2021 09:27    Procedures Procedures   Medications Ordered in ED Medications - No data to display  ED Course  I have reviewed the triage vital signs and the nursing notes.  Pertinent labs & imaging results that were available during my care of the patient were reviewed by me and considered in my medical decision making (see chart for details).  Clinical Course as of 01/02/21 1210  Sat Jan 02, 2021  [id number] EKG was within normal limits.  No evidence of arrhythmia, ST elevation, or any other signs of ischemia. [CF]  0930 I personally reviewed the images.  There is not appear to be any signs of pleural effusion.  Cardiac silhouette appears grossly stable.  No evidence of bowel obstruction.  Will wait for radiologist interpretation. [CF]  [id number] Patient is feeling better. Has not vomited since being in the department. Second troponin is pending. Will fluid challenge now. [CF]    Clinical Course User Index [CF] Cherrie Gauze   MDM Rules/Calculators/A&P                          Jazzilyn is a 60 year old female with history of ischemic cardiomyopathy, CHF with reduced ejection fraction, hypertension, and GERD who presents to the emergency for further evaluation of vomiting.  She does not appear acutely ill or toxic appearing.  Physical exam was reassuring.  No evidence of volume overload.  She is likely dehydrated given her dry mucous membranes.  She had some mild suprapubic tenderness on exam.  We will get a UA to evaluate for underlying urine tract infection.  No focal tenderness to the epigastric, right upper quadrant, or  left upper quadrant.  I doubt gastritis peptic ulcer disease, cholelithiasis, cholecystitis, or pancreatitis.  She did not vomit in the department.  CBC was within normal limits.  CMP revealed no electrolyte abnormalities or AKI.  Troponin was negative.  I have a low suspicion for ACS or other ischemic causes.  EKG was also reassuring.  Lipase was slightly elevated.  Clinically, she is  a social drinker without any focal upper abdominal tenderness.  Again pancreatitis is less likely. UA was negative for UTI. Second troponin is negative. Likely not cardiac.  Patient has improved during her time the emergency department.  She is tolerating p.o. fluids without any vomiting.  At this time it is unclear the source of her vomiting. She appears well clinically. Given history of recent tick bite and symptoms starting after, I am suspicious for Alpha-gal syndrome.  Will have her avoid red meat and follow-up with her PCP and possible allergist referral.  Strict return precautions given.  She is hemodynamically stable and ready for discharge. Final Clinical Impression(s) / ED Diagnoses Final diagnoses:  Vomiting, intractability of vomiting not specified, presence of nausea not specified, unspecified vomiting type    Rx / DC Orders ED Discharge Orders     None        Hendricks Limes, [place] 01/02/21 1236    Long, Wonda Olds, MD 01/03/21 [phone]

## 2021-01-02 NOTE — ED Notes (Signed)
Pt tolerated po challenge

## 2021-01-02 NOTE — Discharge Instructions (Addendum)
You were seen and evaluated in emergency department today for further evaluation of nausea, vomiting, diarrhea, and abdominal pain.  As we discussed, please avoid red meat until you follow-up with your primary care provider and possible allergist referral.  I have provided Zofran as needed for nausea and vomiting.   Please return to the emergency department for worsening vomiting, fever, chills, abdominal pain, diarrhea, or any other concerns you may have.  Thank you for letting me participate in your care today.

## 2021-01-02 NOTE — ED Notes (Signed)
Pt given ginger ale for po challenge 

## 2021-01-03 ENCOUNTER — Encounter (HOSPITAL_COMMUNITY): Payer: Self-pay | Admitting: Emergency Medicine

## 2021-01-03 ENCOUNTER — Emergency Department (HOSPITAL_COMMUNITY): Payer: Commercial Managed Care - PPO

## 2021-01-03 ENCOUNTER — Inpatient Hospital Stay (HOSPITAL_COMMUNITY)
Admission: EM | Admit: 2021-01-03 | Discharge: 2021-01-06 | DRG: 392 | Disposition: A | Discharge status: Home / Self Care | Payer: Commercial Managed Care - PPO | Attending: Internal Medicine | Admitting: Internal Medicine

## 2021-01-03 DIAGNOSIS — I255 Ischemic cardiomyopathy: Secondary | ICD-10-CM | POA: Diagnosis present

## 2021-01-03 DIAGNOSIS — E861 Hypovolemia: Secondary | ICD-10-CM | POA: Diagnosis present

## 2021-01-03 DIAGNOSIS — Z90710 Acquired absence of both cervix and uterus: Secondary | ICD-10-CM

## 2021-01-03 DIAGNOSIS — N179 Acute kidney failure, unspecified: Secondary | ICD-10-CM | POA: Diagnosis not present

## 2021-01-03 DIAGNOSIS — I110 Hypertensive heart disease with heart failure: Secondary | ICD-10-CM | POA: Diagnosis present

## 2021-01-03 DIAGNOSIS — R0789 Other chest pain: Secondary | ICD-10-CM | POA: Diagnosis present

## 2021-01-03 DIAGNOSIS — I5042 Chronic combined systolic (congestive) and diastolic (congestive) heart failure: Secondary | ICD-10-CM | POA: Diagnosis not present

## 2021-01-03 DIAGNOSIS — R112 Nausea with vomiting, unspecified: Secondary | ICD-10-CM | POA: Diagnosis not present

## 2021-01-03 DIAGNOSIS — Z955 Presence of coronary angioplasty implant and graft: Secondary | ICD-10-CM

## 2021-01-03 DIAGNOSIS — Z85828 Personal history of other malignant neoplasm of skin: Secondary | ICD-10-CM

## 2021-01-03 DIAGNOSIS — I10 Essential (primary) hypertension: Secondary | ICD-10-CM | POA: Diagnosis present

## 2021-01-03 DIAGNOSIS — Z79899 Other long term (current) drug therapy: Secondary | ICD-10-CM

## 2021-01-03 DIAGNOSIS — I2510 Atherosclerotic heart disease of native coronary artery without angina pectoris: Secondary | ICD-10-CM | POA: Diagnosis present

## 2021-01-03 DIAGNOSIS — E782 Mixed hyperlipidemia: Secondary | ICD-10-CM | POA: Diagnosis present

## 2021-01-03 DIAGNOSIS — Z8674 Personal history of sudden cardiac arrest: Secondary | ICD-10-CM

## 2021-01-03 DIAGNOSIS — R079 Chest pain, unspecified: Secondary | ICD-10-CM | POA: Diagnosis not present

## 2021-01-03 DIAGNOSIS — K2950 Unspecified chronic gastritis without bleeding: Principal | ICD-10-CM | POA: Diagnosis present

## 2021-01-03 DIAGNOSIS — E876 Hypokalemia: Secondary | ICD-10-CM

## 2021-01-03 DIAGNOSIS — I252 Old myocardial infarction: Secondary | ICD-10-CM

## 2021-01-03 DIAGNOSIS — Z95810 Presence of automatic (implantable) cardiac defibrillator: Secondary | ICD-10-CM

## 2021-01-03 DIAGNOSIS — F411 Generalized anxiety disorder: Secondary | ICD-10-CM | POA: Diagnosis present

## 2021-01-03 DIAGNOSIS — Z91048 Other nonmedicinal substance allergy status: Secondary | ICD-10-CM

## 2021-01-03 DIAGNOSIS — Z7902 Long term (current) use of antithrombotics/antiplatelets: Secondary | ICD-10-CM

## 2021-01-03 DIAGNOSIS — K219 Gastro-esophageal reflux disease without esophagitis: Secondary | ICD-10-CM | POA: Diagnosis present

## 2021-01-03 DIAGNOSIS — Z7982 Long term (current) use of aspirin: Secondary | ICD-10-CM

## 2021-01-03 DIAGNOSIS — I959 Hypotension, unspecified: Secondary | ICD-10-CM

## 2021-01-03 DIAGNOSIS — E785 Hyperlipidemia, unspecified: Secondary | ICD-10-CM | POA: Diagnosis present

## 2021-01-03 DIAGNOSIS — F17210 Nicotine dependence, cigarettes, uncomplicated: Secondary | ICD-10-CM | POA: Diagnosis present

## 2021-01-03 DIAGNOSIS — I25119 Atherosclerotic heart disease of native coronary artery with unspecified angina pectoris: Secondary | ICD-10-CM

## 2021-01-03 DIAGNOSIS — W57XXXA Bitten or stung by nonvenomous insect and other nonvenomous arthropods, initial encounter: Secondary | ICD-10-CM | POA: Diagnosis present

## 2021-01-03 DIAGNOSIS — Z20822 Contact with and (suspected) exposure to covid-19: Secondary | ICD-10-CM | POA: Diagnosis present

## 2021-01-03 LAB — COMPREHENSIVE METABOLIC PANEL
ALT: 15 U/L (ref 0–44)
AST: 19 U/L (ref 15–41)
Albumin: 3.6 g/dL (ref 3.5–5.0)
Alkaline Phosphatase: 65 U/L (ref 38–126)
Anion gap: 10 (ref 5–15)
BUN: 18 mg/dL (ref 6–20)
CO2: 25 mmol/L (ref 22–32)
Calcium: 9.5 mg/dL (ref 8.9–10.3)
Chloride: 102 mmol/L (ref 98–111)
Creatinine, Ser: 1.72 mg/dL — ABNORMAL HIGH (ref 0.44–1.00)
GFR, Estimated: 34 mL/min — ABNORMAL LOW (ref >60)
Glucose, Bld: 108 mg/dL — ABNORMAL HIGH (ref 70–99)
Potassium: 3.1 mmol/L — ABNORMAL LOW (ref 3.5–5.1)
Sodium: 137 mmol/L (ref 135–145)
Total Bilirubin: 0.7 mg/dL (ref 0.3–1.2)
Total Protein: 6.6 g/dL (ref 6.5–8.1)

## 2021-01-03 LAB — CBC WITH DIFFERENTIAL/PLATELET
Abs Immature Granulocytes: 0.03 10*3/uL (ref 0.00–0.07)
Basophils Absolute: 0.1 10*3/uL (ref 0.0–0.1)
Basophils Relative: 1 %
Eosinophils Absolute: 0.1 10*3/uL (ref 0.0–0.5)
Eosinophils Relative: 1 %
HCT: 39 % (ref 36.0–46.0)
Hemoglobin: 13 g/dL (ref 12.0–15.0)
Immature Granulocytes: 0 %
Lymphocytes Relative: 34 %
Lymphs Abs: 2.9 10*3/uL (ref 0.7–4.0)
MCH: 29.2 pg (ref 26.0–34.0)
MCHC: 33.3 g/dL (ref 30.0–36.0)
MCV: 87.6 fL (ref 80.0–100.0)
Monocytes Absolute: 0.7 10*3/uL (ref 0.1–1.0)
Monocytes Relative: 9 %
Neutro Abs: 4.7 10*3/uL (ref 1.7–7.7)
Neutrophils Relative %: 55 %
Platelets: 396 10*3/uL (ref 150–400)
RBC: 4.45 MIL/uL (ref 3.87–5.11)
RDW: 14.5 % (ref 11.5–15.5)
WBC: 8.5 10*3/uL (ref 4.0–10.5)
nRBC: 0 % (ref 0.0–0.2)

## 2021-01-03 LAB — URINALYSIS, ROUTINE W REFLEX MICROSCOPIC
Bilirubin Urine: NEGATIVE
Glucose, UA: >=500 mg/dL — AB
Hgb urine dipstick: NEGATIVE
Ketones, ur: NEGATIVE mg/dL
Leukocytes,Ua: NEGATIVE
Nitrite: NEGATIVE
Protein, ur: NEGATIVE mg/dL
Specific Gravity, Urine: 1.011 (ref 1.005–1.030)
pH: 5 (ref 5.0–8.0)

## 2021-01-03 LAB — D-DIMER, QUANTITATIVE: D-Dimer, Quant: 0.53 ug/mL-FEU — ABNORMAL HIGH (ref 0.00–0.50)

## 2021-01-03 LAB — BRAIN NATRIURETIC PEPTIDE: B Natriuretic Peptide: 50.3 pg/mL (ref 0.0–100.0)

## 2021-01-03 LAB — RESP PANEL BY RT-PCR (FLU A&B, COVID) ARPGX2
Influenza A by PCR: NEGATIVE
Influenza B by PCR: NEGATIVE
SARS Coronavirus 2 by RT PCR: NEGATIVE

## 2021-01-03 LAB — MAGNESIUM: Magnesium: 2.1 mg/dL (ref 1.7–2.4)

## 2021-01-03 LAB — TROPONIN I (HIGH SENSITIVITY)
Troponin I (High Sensitivity): 6 ng/L (ref <18)
Troponin I (High Sensitivity): 7 ng/L (ref <18)

## 2021-01-03 LAB — SARS CORONAVIRUS 2 (TAT 6-24 HRS): SARS Coronavirus 2: NEGATIVE

## 2021-01-03 LAB — LIPASE, BLOOD: Lipase: 69 U/L — ABNORMAL HIGH (ref 11–51)

## 2021-01-03 MED ORDER — LIDOCAINE VISCOUS HCL 2 % MT SOLN
15.0000 mL | Freq: Once | OROMUCOSAL | Status: AC
  Administered 2021-01-03: 15 mL via ORAL
  Filled 2021-01-03 (×2): qty 15

## 2021-01-03 MED ORDER — SODIUM CHLORIDE 0.9 % IV BOLUS
500.0000 mL | Freq: Once | INTRAVENOUS | Status: AC
  Administered 2021-01-03: 500 mL via INTRAVENOUS

## 2021-01-03 MED ORDER — POTASSIUM CHLORIDE 10 MEQ/100ML IV SOLN
10.0000 meq | INTRAVENOUS | Status: DC
  Administered 2021-01-03: 10 meq via INTRAVENOUS
  Filled 2021-01-03 (×2): qty 100

## 2021-01-03 MED ORDER — ALUM & MAG HYDROXIDE-SIMETH 200-200-20 MG/5ML PO SUSP
30.0000 mL | Freq: Once | ORAL | Status: AC
  Administered 2021-01-03: 30 mL via ORAL
  Filled 2021-01-03 (×2): qty 30

## 2021-01-03 MED ORDER — POTASSIUM CHLORIDE CRYS ER 20 MEQ PO TBCR
40.0000 meq | EXTENDED_RELEASE_TABLET | Freq: Once | ORAL | Status: AC
  Administered 2021-01-03: 40 meq via ORAL
  Filled 2021-01-03: qty 2

## 2021-01-03 MED ORDER — ONDANSETRON HCL 4 MG/2ML IJ SOLN
4.0000 mg | Freq: Once | INTRAMUSCULAR | Status: AC
Start: 2021-01-03 — End: 2021-01-03
  Administered 2021-01-03: 4 mg via INTRAVENOUS
  Filled 2021-01-03: qty 2

## 2021-01-03 MED ORDER — SODIUM CHLORIDE 0.9 % IV SOLN: INTRAVENOUS | Status: DC | PRN

## 2021-01-03 NOTE — ED Provider Notes (Addendum)
Emergency Medicine Provider Triage Evaluation Note  Erin [name] , a 61 y.o. female  was evaluated in triage.  Pt complains of chest pressure. Began this am. Seen at [facility] yesterday with similar symptoms. Having multiple episodes of NBNB emesis. No abd pain. Feels like something is sitting on chest. Pain goes into left neck. No arm pain. Has burning into center of back and left scapula. HX of NSTEMI and cardiac arrest. AICD in place, no recent fires. Feels lightheaded and dizzy. No increase in LE. No PND or Orthopnea.  No known hx of AAA, dissection  Plavix, No DOAC, coumadin  Review of Systems  Positive: CP, SOB, emesis, dizziness Negative: HA, blurred vision, numbness, unilateral weakness  Physical Exam  There were no vitals taken for this visit. Gen:   Awake, no distress   Chest:  clear Resp:  Normal effort  MSK:   Moves extremities without difficulty, no LE edema Abd:  Soft non tender Neuro:  Cn 2-12 grossly intact, Equal grip, intact sensation Other:    Medical Decision Making  Medically screening exam initiated at 6:28 PM.  Appropriate orders placed.  [name] was informed that the remainder of the evaluation will be completed by another provider, this initial triage assessment does not replace that evaluation, and the importance of remaining in the ED until their evaluation is complete.  CP, emesis, Dizzy  Jameika Kinn A, PA-C 01/03/21 1815  ADDEND: Nursing made me aware patient BP 72 systolic. Nursing made aware patient needs room in back.    Nettie Elm, PA-C 01/03/21 1829    Wyvonnia Dusky, MD 01/04/21 [phone]

## 2021-01-03 NOTE — ED Provider Notes (Signed)
[facility] EMERGENCY DEPARTMENT Provider Note   CSN: [id number] Arrival date & time: 01/03/21  1748     History No chief complaint on file.   Erin [name] is a 60 y.o. female.  Erin [name] is a 60 y.o. female with a history of HFrEF, CAD, s/p PCI, hypertension, hyperlipidemia, IBS, who presents to the emergency department for evaluation of vomiting, chest pain and hypotension.  Patient reports that she was seen at [facility] emergency department yesterday for vomiting and some mild chest pain.  She reports that she has been having persistent vomiting since Thursday when she ate a sausage breakfast sandwich from [employer].  Patient reports ever since she had 3 tick bites at the beginning of the summer she has had difficulty tolerating beef or pork products with associated vomiting.  Thinks that she may have the alpha gal protein but has not been tested for this.  She has not had any abdominal pain associated with nausea and vomiting, but has had some intermittent lower chest pressure.  She was seen yesterday, work-up was reassuring and nausea had improved with Zofran and she was discharged home.  Reports she took Zofran this morning but when she tried to eat or drink anything she began having persistent vomiting once again.  Reports this morning she started to feel very lightheaded with any position change, checked her blood pressure at home and initially her systolic was in the 0000000, she reports typically she runs in the 110s, after another episode of vomiting she began having lower chest discomfort again with some pain radiating to the left side of her neck and rechecked her blood pressure and it was 80/41.  No syncope.  Pain does not radiate into her arms, no numbness, weakness or tingling.  Does have some burning in the center of her back and left scapula.  History of NSTEMI and cardiac arrest, AICD in place but no recent fires.  No increased lower extremity edema, PND or  orthopnea and reports her weights have actually been down a bit, she has not been able to keep down her cardiac medications over the past few days.  The history is provided by the patient and medical records.      Past Medical History:  Diagnosis Date   Anxiety state 03/25/2016   Basal cell carcinoma of forehead    Brain aneurysm    CHF (congestive heart failure) (HCC)    Coronary artery disease    a. 03/11/16 PCI with DES-->Prox/Mid Cx;  b. 03/14/16 PCI with DES x2-->RCA, EF 30-35%.   Essential hypertension    GERD (gastroesophageal reflux disease)    HFrEF (heart failure with reduced ejection fraction) ([place])    a. 10/2016 Echo: EF 35-40%, Gr1 DD, mild focal basal septal hypertrophy, basal inflat, mid inflat, basal antlat AK. Mid infept/inf/antlat, apical lateral sev HK. Mod MR. mildly reduced RV fxn. Mild TR.   History of pneumonia    Hyperlipidemia    IBS (irritable bowel syndrome)    Ischemic cardiomyopathy    a. 10/2016 Echo: EF 35-40%, Gr1 DD.   Mitral regurgitation    NSTEMI (non-ST elevated myocardial infarction) ([place]) 03/10/2016   Pneumonia 03/2016   Squamous cell cancer of skin of nose    Thrombocytosis 03/26/2016   Tobacco abuse    Trichimoniasis    Wears dentures    Wears glasses     Patient Active Problem List   Diagnosis Date Noted   Intractable nausea and vomiting  01/03/2021   Left-sided weakness 10/19/2020   Cough 02/11/2020   Near syncope 12/10/2019   Brain aneurysm 04/15/2019   Dysphagia 02/27/2018   Encounter for screening colonoscopy 02/27/2018   History of Clostridium difficile infection 02/27/2018   Frequent stools 12/20/2017   Chronic combined systolic and diastolic CHF (congestive heart failure) ([place]) 06/20/2016   Hypokalemia    Acute CHF (congestive heart failure) ([place]) 05/16/2016   Acute on chronic systolic CHF (congestive heart failure) ([place]) 05/16/2016   Acute respiratory failure with hypoxia (HCC)    Thrombocytosis 03/26/2016    Cardiomyopathy, ischemic 03/25/2016   Chronic combined systolic and diastolic heart failure ([place]) 03/25/2016   Anxiety state 03/25/2016   Troponin level elevated 03/25/2016   CAD (coronary artery disease), native coronary artery 03/25/2016   Normocytic anemia 03/25/2016   SOB (shortness of breath) 03/24/2016   Lightheadedness 03/17/2016   Hypotension 03/17/2016   Tobacco abuse 03/12/2016   NSTEMI (non-ST elevated myocardial infarction) ([place]) 03/11/2016   Atypical chest pain    Essential hypertension 09/06/2015   Mixed hyperlipidemia 09/06/2015   GERD (gastroesophageal reflux disease) 09/06/2015   Chest pain 09/06/2015    Past Surgical History:  Procedure Laterality Date   APPENDECTOMY     BIOPSY  09/20/2018   Procedure: BIOPSY;  Surgeon: Daneil Dolin, MD;  [facility];  Service: Endoscopy;;  colon   CARDIAC CATHETERIZATION N/A 03/11/2016   Procedure: Left Heart Cath and Coronary Angiography;  Surgeon: Leonie Man, MD;  Location: [place] [facility] LAB;  Service: Cardiovascular;  Laterality: N/A;   CARDIAC CATHETERIZATION N/A 03/11/2016   Procedure: Coronary Stent Intervention;  Surgeon: Leonie Man, MD;  Location: [place] [facility];  Service: Cardiovascular;  Laterality: N/A;   CARDIAC CATHETERIZATION N/A 03/14/2016   Procedure: Coronary Stent Intervention;  Surgeon: Peter M Martinique, MD;  Location: [place] [facility] LAB;  Service: Cardiovascular;  Laterality: N/A;   CHOLECYSTECTOMY OPEN  1984   COLONOSCOPY WITH PROPOFOL N/A 09/20/2018   Procedure: COLONOSCOPY WITH PROPOFOL;  Surgeon: Daneil Dolin, MD;  [facility];  Service: Endoscopy;  Laterality: N/A;  10:30am   CORONARY ANGIOPLASTY WITH STENT PLACEMENT  03/14/2016   ESOPHAGOGASTRODUODENOSCOPY (EGD) WITH PROPOFOL N/A 09/20/2018   Procedure: ESOPHAGOGASTRODUODENOSCOPY (EGD) WITH PROPOFOL;  Surgeon: Daneil Dolin, MD;  [facility];  Service: Endoscopy;  Laterality: N/A;   FINGER ARTHROPLASTY  Left 05/14/2013   Procedure: LEFT THUMB CARPAL METACARPAL ARTHROPLASTY;  Surgeon: Tennis Must, MD;  Location: [facility];  Service: Orthopedics;  Laterality: Left;   ICD IMPLANT N/A 04/03/2020   Procedure: ICD IMPLANT;  Surgeon: Constance Haw, MD;  Location: [place] [facility] LAB;  Service: Cardiovascular;  Laterality: N/A;   IR ANGIO INTRA EXTRACRAN SEL COM CAROTID INNOMINATE BILAT MOD SED  01/05/2017   IR ANGIO INTRA EXTRACRAN SEL COM CAROTID INNOMINATE BILAT MOD SED  03/19/2019   IR ANGIO INTRA EXTRACRAN SEL COM CAROTID INNOMINATE BILAT MOD SED  06/04/2020   IR ANGIO INTRA EXTRACRAN SEL INTERNAL CAROTID UNI L MOD SED  04/15/2019   IR ANGIO VERTEBRAL SEL VERTEBRAL BILAT MOD SED  01/05/2017   IR ANGIO VERTEBRAL SEL VERTEBRAL BILAT MOD SED  03/19/2019   IR ANGIO VERTEBRAL SEL VERTEBRAL UNI L MOD SED  06/04/2020   IR ANGIOGRAM FOLLOW UP STUDY  04/15/2019   IR RADIOLOGIST EVAL & MGMT  12/30/2016   IR TRANSCATH/EMBOLIZ  04/15/2019   IR US GUIDE VASC ACCESS RIGHT  03/19/2019  IR US GUIDE VASC ACCESS RIGHT  06/04/2020   MALONEY DILATION N/A 09/20/2018   Procedure: Venia Minks DILATION;  Surgeon: Daneil Dolin, MD;  [facility];  Service: Endoscopy;  Laterality: N/A;   RADIOLOGY WITH ANESTHESIA N/A 04/15/2019   Procedure: [employer];  Surgeon: Luanne Bras, MD;  Location: [place];  Service: Radiology;  Laterality: N/A;   RIGHT/LEFT HEART CATH AND CORONARY ANGIOGRAPHY N/A 08/19/2019   Procedure: RIGHT/LEFT HEART CATH AND CORONARY ANGIOGRAPHY;  Surgeon: Larey Dresser, MD;  Location: [place] [facility];  Service: Cardiovascular;  Laterality: N/A;   TUBAL LIGATION  1987   VAGINAL HYSTERECTOMY  2009     OB History     Gravida  4   Para  4   Term  4   Preterm      AB      Living  4      SAB      IAB      Ectopic      Multiple      Live Births              Family History  Problem Relation Age of Onset   Diabetes Father    Hypertension Father     CAD Father    Colon polyps Father 37       pre-cancerous    Stroke Father    Dementia Father    Stroke Mother    Hypertension Mother    Diabetes Mother    Heart failure Other    Breast cancer Maternal Grandmother    Colon cancer Neg Hx     Social History   Tobacco Use   Smoking status: Some Days    Packs/day: 0.10    Years: 15.00    Pack years: 1.50    Types: Cigarettes   Smokeless tobacco: Never   Tobacco comments:    smokes a cigarette occasionally  Vaping Use   Vaping Use: Never used  Substance Use Topics   Alcohol use: Yes    Comment: occasionally   Drug use: Not Currently    Types: Marijuana    Comment: former- 2017 last time    Home Medications Prior to Admission medications   Medication Sig Start Date End Date Taking? Authorizing Provider  acetaminophen (TYLENOL) 325 MG tablet Take 650 mg by mouth every 6 (six) hours as needed for mild pain or headache.    [provider]  ALPRAZolam Duanne Moron) 1 MG tablet Take 1-2 mg by mouth at bedtime.    [provider]  aspirin EC 81 MG tablet Take 81 mg by mouth every evening.     [provider]  atorvastatin (LIPITOR) 80 MG tablet Take 1 tablet (80 mg total) by mouth daily at 6 PM. 11/10/20   Larey Dresser, MD  carvedilol (COREG) 6.25 MG tablet Take 0.5 tablets (3.125 mg total) by mouth 2 (two) times daily. 11/13/20   Larey Dresser, MD  Cholecalciferol (VITAMIN D3) 10 MCG (400 UNIT) CAPS Take 1 capsule by mouth daily.    [provider]  clopidogrel (PLAVIX) 75 MG tablet Take 1 tablet (75 mg total) by mouth daily. 10/27/20   Louk, Bea Graff, PA-C  dapagliflozin propanediol (FARXIGA) 10 MG TABS tablet Take 1 tablet (10 mg total) by mouth daily before breakfast 11/10/20   Larey Dresser, MD  ezetimibe (ZETIA) 10 MG tablet Take 1 tablet (10 mg total) by mouth daily. 07/07/20   Larey Dresser, MD  furosemide (LASIX) 20 MG tablet Take 2 tablets (40 mg total) by mouth daily. 11/10/20 11/10/21   Larey Dresser, MD  gabapentin (NEURONTIN) 100 MG capsule Take 200 mg by mouth at bedtime.    [provider]  loperamide (IMODIUM) 2 MG capsule Take 2 mg by mouth 4 (four) times daily as needed for diarrhea or loose stools (ibs).     [provider]  LORazepam (ATIVAN) 1 MG tablet Take 0.5-1 tablets (0.5-1 mg total) by mouth daily. 09/15/20     Multiple Vitamins-Minerals (MULTIVITAMIN WITH MINERALS) tablet Take 1 tablet by mouth daily.    [provider]  nitroGLYCERIN (NITROSTAT) 0.4 MG SL tablet Place 1 tablet (0.4 mg total) under the tongue every 5 (five) minutes x 3 doses as needed for chest pain (if no relief after 2nd dose, proceed to the ED for an evaluation or call 911). 09/01/20   Verta Ellen., NP  omeprazole (PRILOSEC) 20 MG capsule Take 20 mg by mouth daily.    [provider]  ondansetron (ZOFRAN ODT) 4 MG disintegrating tablet Take 1 tablet (4 mg total) by mouth every 8 (eight) hours as needed for nausea or vomiting. 01/02/21   Myna Bright M, PA-C  rOPINIRole (REQUIP) 0.5 MG tablet Take 0.5 mg by mouth at bedtime.  11/13/17   [provider]  sacubitril-valsartan (ENTRESTO) 97-103 MG Take 1 tablet by mouth 2 (two) times daily. 08/17/20   Clegg, Amy D, NP  spironolactone (ALDACTONE) 25 MG tablet Take 1/2 (one-half) tablet by mouth once daily Patient taking differently: Take 12.5 mg by mouth daily. 12/10/20   Larey Dresser, MD  zinc gluconate 50 MG tablet Take 50 mg by mouth daily.    [provider]    Allergies    Tape  Review of Systems   Review of Systems  Constitutional:  Positive for fatigue. Negative for chills and fever.  HENT: Negative.    Respiratory:  Negative for cough and shortness of breath.   Cardiovascular:  Positive for chest pain.  Gastrointestinal:  Positive for nausea and vomiting. Negative for abdominal pain, constipation and diarrhea.  Musculoskeletal:  Negative for arthralgias and myalgias.   Skin:  Negative for color change and rash.  Neurological:  Positive for light-headedness. Negative for syncope.  All other systems reviewed and are negative.  Physical Exam Updated Vital Signs BP 92/62   Pulse 77   Temp 98.1 F (36.7 C) (Oral)   Resp 18   SpO2 99%   Physical Exam Vitals and nursing note reviewed.  Constitutional:      General: She is not in acute distress.    Appearance: Normal appearance. She is well-developed. She is ill-appearing. She is not diaphoretic.     Comments: Alert, ill appearing, but in no acute distress  HENT:     Head: Normocephalic and atraumatic.     Mouth/Throat:     Mouth: Mucous membranes are dry.  Eyes:     General:        Right eye: No discharge.        Left eye: No discharge.  Cardiovascular:     Rate and Rhythm: Normal rate and regular rhythm.     Pulses: Normal pulses.     Heart sounds: Normal heart sounds.  Pulmonary:     Effort: Pulmonary effort is normal. No respiratory distress.     Breath sounds: Normal breath sounds. No wheezing or rales.     Comments: Respirations equal and  unlabored, patient able to speak in full sentences, lungs clear to auscultation bilaterally, breath sounds slightly diminished in the bases Abdominal:     General: Bowel sounds are normal. There is no distension.     Palpations: Abdomen is soft. There is no mass.     Tenderness: There is no abdominal tenderness. There is no guarding.     Comments: Abdomen soft, nondistended, nontender to palpation in all quadrants without guarding or peritoneal signs  Musculoskeletal:        General: No deformity.     Cervical back: Neck supple.     Right lower leg: No edema.     Left lower leg: No edema.  Skin:    General: Skin is warm and dry.     Capillary Refill: Capillary refill takes less than 2 seconds.  Neurological:     Mental Status: She is alert and oriented to person, place, and time.     Coordination: Coordination normal.     Comments: Speech is  clear, able to follow commands Moves extremities without ataxia, coordination intact  Psychiatric:        Mood and Affect: Mood normal.        Behavior: Behavior normal.    ED Results / Procedures / Treatments   Labs (all labs ordered are listed, but only abnormal results are displayed) Labs Reviewed  COMPREHENSIVE METABOLIC PANEL - Abnormal; Notable for the following components:      Result Value   Potassium 3.1 (*)    Glucose, Bld 108 (*)    Creatinine, Ser 1.72 (*)    GFR, Estimated 34 (*)    All other components within normal limits  LIPASE, BLOOD - Abnormal; Notable for the following components:   Lipase 69 (*)    All other components within normal limits  D-DIMER, QUANTITATIVE - Abnormal; Notable for the following components:   D-Dimer, Quant 0.53 (*)    All other components within normal limits  RESP PANEL BY RT-PCR (FLU A&B, COVID) ARPGX2  CBC WITH DIFFERENTIAL/PLATELET  BRAIN NATRIURETIC PEPTIDE  URINALYSIS, ROUTINE W REFLEX MICROSCOPIC  MAGNESIUM  TROPONIN I (HIGH SENSITIVITY)  TROPONIN I (HIGH SENSITIVITY)    EKG None  Radiology DG Chest 2 View  Result Date: 01/03/2021 CLINICAL DATA:  Chest pressure. EXAM: CHEST - 2 VIEW COMPARISON:  December 13, 2020 FINDINGS: The single lead AICD device is in good position. The heart, hila, mediastinum, lungs, and pleura are otherwise unremarkable. No acute abnormalities. IMPRESSION: No active cardiopulmonary disease. Electronically Signed   By: Dorise Bullion III M.D.   On: 01/03/2021 19:48   DG Abdomen Acute W/Chest  Result Date: 01/02/2021 CLINICAL DATA:  Abdominal pain, diarrhea. EXAM: DG ABDOMEN ACUTE WITH 1 VIEW CHEST COMPARISON:  October 23, 2018. FINDINGS: There is no evidence of dilated bowel loops or free intraperitoneal air. No radiopaque calculi or other significant radiographic abnormality is seen. Heart size and mediastinal contours are within normal limits. Both lungs are clear. IMPRESSION: Negative abdominal  radiographs.  No acute cardiopulmonary disease. Electronically Signed   By: Marijo Conception M.D.   On: 01/02/2021 09:27    Procedures Procedures   Medications Ordered in ED Medications  sodium chloride 0.9 % bolus 500 mL (0 mLs Intravenous Stopped 01/03/21 2156)  potassium chloride 10 mEq in 100 mL IVPB (10 mEq Intravenous New Bag/Given 01/03/21 2125)  alum & mag hydroxide-simeth (MAALOX/MYLANTA) 200-200-20 MG/5ML suspension 30 mL (has no administration in time range)    And  lidocaine (XYLOCAINE)  2 % viscous mouth solution 15 mL (has no administration in time range)  0.9 %  sodium chloride infusion (has no administration in time range)  ondansetron (ZOFRAN) injection 4 mg (4 mg Intravenous Given 01/03/21 2049)    ED Course  I have reviewed the triage vital signs and the nursing notes.  Pertinent labs & imaging results that were available during my care of the patient were reviewed by me and considered in my medical decision making (see chart for details).    MDM Rules/Calculators/A&P                           60 year old female presents with 4 days of persistent nausea and vomiting after eating pork, reports intolerance to beef and pork products since having several tick bite, but question possible alpha gal protein.  Was seen at [facility] with reassuring work-up but now presents with worsening vomiting now with significant lightheadedness with any position change. SBP 72 on arrival but pt alert and mentating well. Reports some associated chest pressure, patient with history of NSTEMI and CHF with EF of 30-35%.  Labs ordered to assess for ACS, CHF or potential PE from triage.  Chest pressure could also be due to esophageal spasm from frequent vomiting.   Patient appears dry, no signs of fluid overload, weights are down per patient.  Will order 500 cc fluid bolus to gently rehydrate and continue to closely monitor blood pressure.  Blood pressure is very positional and when patient  sits up it drops to the 80s/90s.  I have independently ordered, reviewed and interpreted all labs and imaging: CBC: No leukocytosis, normal hemoglobin CMP: Hypokalemia of 3.1, magnesium ordered as well as IV potassium replacement, glucose 108.  Creatinine is 1.72 today, was 1.0 yesterday suggestive of AKI, likely in the setting of dehydration and hypotension.  Normal LFTs.   Initial troponin is negative at 6 which is very reassuring given patient's cardiac history.  BNP is not elevated either.  Patient's D-dimer is very minimally elevated at 0.53, this was ordered from triage but I have overall low suspicion for PE, if patient had a significant enough PE to be causing hypotension would expect a much larger D-dimer and likely a troponin elevation as well.  With patient's worsening creatinine and GFR of 34 we will hold off on CTA as to avoid further worsening of renal function.  Patient will need admission regardless, could potentially do VQ scan as needed for continue to watch closely.  Patient's chest x-ray is unchanged with no widened mediastinum or other active cardiopulmonary disease.  Feel patient will need admission for further gentle hydration and persistent nausea and vomiting.  Case discussed with Dr. Cyd Silence with [facility] hospitalist who will see and admit the patient.  Final Clinical Impression(s) / ED Diagnoses Final diagnoses:  Hypotension, unspecified hypotension type  AKI (acute kidney injury) ([place])  Intractable nausea and vomiting    Rx / DC Orders ED Discharge Orders     None        Janet Berlin 01/03/21 2148    Davonna Belling, MD 01/03/21 [id number]

## 2021-01-03 NOTE — ED Notes (Signed)
Patient transported to X-ray 

## 2021-01-03 NOTE — ED Triage Notes (Signed)
Pt here from home with c/o left side paina along with cp . Was just at [facility] yesterday and had a negative workup

## 2021-01-04 ENCOUNTER — Ambulatory Visit (INDEPENDENT_AMBULATORY_CARE_PROVIDER_SITE_OTHER): Payer: 59

## 2021-01-04 ENCOUNTER — Observation Stay (HOSPITAL_COMMUNITY): Payer: Commercial Managed Care - PPO

## 2021-01-04 ENCOUNTER — Encounter (HOSPITAL_COMMUNITY): Payer: Self-pay | Admitting: Internal Medicine

## 2021-01-04 DIAGNOSIS — Z7902 Long term (current) use of antithrombotics/antiplatelets: Secondary | ICD-10-CM | POA: Diagnosis not present

## 2021-01-04 DIAGNOSIS — F17210 Nicotine dependence, cigarettes, uncomplicated: Secondary | ICD-10-CM | POA: Diagnosis present

## 2021-01-04 DIAGNOSIS — N179 Acute kidney failure, unspecified: Secondary | ICD-10-CM | POA: Diagnosis present

## 2021-01-04 DIAGNOSIS — K2970 Gastritis, unspecified, without bleeding: Secondary | ICD-10-CM | POA: Diagnosis not present

## 2021-01-04 DIAGNOSIS — R0789 Other chest pain: Secondary | ICD-10-CM | POA: Diagnosis present

## 2021-01-04 DIAGNOSIS — Z20822 Contact with and (suspected) exposure to covid-19: Secondary | ICD-10-CM | POA: Diagnosis present

## 2021-01-04 DIAGNOSIS — R112 Nausea with vomiting, unspecified: Secondary | ICD-10-CM | POA: Diagnosis not present

## 2021-01-04 DIAGNOSIS — K219 Gastro-esophageal reflux disease without esophagitis: Secondary | ICD-10-CM | POA: Diagnosis present

## 2021-01-04 DIAGNOSIS — I252 Old myocardial infarction: Secondary | ICD-10-CM | POA: Diagnosis not present

## 2021-01-04 DIAGNOSIS — I110 Hypertensive heart disease with heart failure: Secondary | ICD-10-CM | POA: Diagnosis present

## 2021-01-04 DIAGNOSIS — Z79899 Other long term (current) drug therapy: Secondary | ICD-10-CM | POA: Diagnosis not present

## 2021-01-04 DIAGNOSIS — Z85828 Personal history of other malignant neoplasm of skin: Secondary | ICD-10-CM | POA: Diagnosis not present

## 2021-01-04 DIAGNOSIS — Z955 Presence of coronary angioplasty implant and graft: Secondary | ICD-10-CM | POA: Diagnosis not present

## 2021-01-04 DIAGNOSIS — I5042 Chronic combined systolic (congestive) and diastolic (congestive) heart failure: Secondary | ICD-10-CM | POA: Diagnosis present

## 2021-01-04 DIAGNOSIS — E782 Mixed hyperlipidemia: Secondary | ICD-10-CM | POA: Diagnosis present

## 2021-01-04 DIAGNOSIS — Z95810 Presence of automatic (implantable) cardiac defibrillator: Secondary | ICD-10-CM | POA: Diagnosis not present

## 2021-01-04 DIAGNOSIS — Z7982 Long term (current) use of aspirin: Secondary | ICD-10-CM | POA: Diagnosis not present

## 2021-01-04 DIAGNOSIS — F411 Generalized anxiety disorder: Secondary | ICD-10-CM | POA: Diagnosis present

## 2021-01-04 DIAGNOSIS — I2510 Atherosclerotic heart disease of native coronary artery without angina pectoris: Secondary | ICD-10-CM | POA: Diagnosis present

## 2021-01-04 DIAGNOSIS — I255 Ischemic cardiomyopathy: Secondary | ICD-10-CM | POA: Diagnosis present

## 2021-01-04 DIAGNOSIS — E861 Hypovolemia: Secondary | ICD-10-CM | POA: Diagnosis present

## 2021-01-04 DIAGNOSIS — K2950 Unspecified chronic gastritis without bleeding: Secondary | ICD-10-CM | POA: Diagnosis present

## 2021-01-04 DIAGNOSIS — W57XXXA Bitten or stung by nonvenomous insect and other nonvenomous arthropods, initial encounter: Secondary | ICD-10-CM | POA: Diagnosis present

## 2021-01-04 DIAGNOSIS — Z90710 Acquired absence of both cervix and uterus: Secondary | ICD-10-CM | POA: Diagnosis not present

## 2021-01-04 DIAGNOSIS — I959 Hypotension, unspecified: Secondary | ICD-10-CM | POA: Diagnosis present

## 2021-01-04 DIAGNOSIS — Z91048 Other nonmedicinal substance allergy status: Secondary | ICD-10-CM | POA: Diagnosis not present

## 2021-01-04 DIAGNOSIS — E876 Hypokalemia: Secondary | ICD-10-CM | POA: Diagnosis present

## 2021-01-04 LAB — CBC WITH DIFFERENTIAL/PLATELET
Abs Immature Granulocytes: 0.03 10*3/uL (ref 0.00–0.07)
Basophils Absolute: 0.1 10*3/uL (ref 0.0–0.1)
Basophils Relative: 1 %
Eosinophils Absolute: 0.1 10*3/uL (ref 0.0–0.5)
Eosinophils Relative: 2 %
HCT: 36.6 % (ref 36.0–46.0)
Hemoglobin: 11.7 g/dL — ABNORMAL LOW (ref 12.0–15.0)
Immature Granulocytes: 0 %
Lymphocytes Relative: 37 %
Lymphs Abs: 2.9 10*3/uL (ref 0.7–4.0)
MCH: 28.3 pg (ref 26.0–34.0)
MCHC: 32 g/dL (ref 30.0–36.0)
MCV: 88.6 fL (ref 80.0–100.0)
Monocytes Absolute: 0.7 10*3/uL (ref 0.1–1.0)
Monocytes Relative: 9 %
Neutro Abs: 4.1 10*3/uL (ref 1.7–7.7)
Neutrophils Relative %: 51 %
Platelets: 359 10*3/uL (ref 150–400)
RBC: 4.13 MIL/uL (ref 3.87–5.11)
RDW: 14.3 % (ref 11.5–15.5)
WBC: 8 10*3/uL (ref 4.0–10.5)
nRBC: 0 % (ref 0.0–0.2)

## 2021-01-04 LAB — COMPREHENSIVE METABOLIC PANEL
ALT: 14 U/L (ref 0–44)
AST: 17 U/L (ref 15–41)
Albumin: 3.2 g/dL — ABNORMAL LOW (ref 3.5–5.0)
Alkaline Phosphatase: 56 U/L (ref 38–126)
Anion gap: 9 (ref 5–15)
BUN: 16 mg/dL (ref 6–20)
CO2: 24 mmol/L (ref 22–32)
Calcium: 9.1 mg/dL (ref 8.9–10.3)
Chloride: 107 mmol/L (ref 98–111)
Creatinine, Ser: 1.37 mg/dL — ABNORMAL HIGH (ref 0.44–1.00)
GFR, Estimated: 45 mL/min — ABNORMAL LOW (ref >60)
Glucose, Bld: 93 mg/dL (ref 70–99)
Potassium: 4.1 mmol/L (ref 3.5–5.1)
Sodium: 140 mmol/L (ref 135–145)
Total Bilirubin: 0.8 mg/dL (ref 0.3–1.2)
Total Protein: 6.1 g/dL — ABNORMAL LOW (ref 6.5–8.1)

## 2021-01-04 LAB — HIV ANTIBODY (ROUTINE TESTING W REFLEX): HIV Screen 4th Generation wRfx: NONREACTIVE

## 2021-01-04 LAB — MAGNESIUM: Magnesium: 2.3 mg/dL (ref 1.7–2.4)

## 2021-01-04 MED ORDER — PANTOPRAZOLE SODIUM 40 MG IV SOLR
40.0000 mg | Freq: Two times a day (BID) | INTRAVENOUS | Status: DC
  Administered 2021-01-04 – 2021-01-06 (×6): 40 mg via INTRAVENOUS
  Filled 2021-01-04 (×6): qty 40

## 2021-01-04 MED ORDER — SACUBITRIL-VALSARTAN 97-103 MG PO TABS
1.0000 | ORAL_TABLET | Freq: Two times a day (BID) | ORAL | Status: DC
  Administered 2021-01-04: 1 via ORAL
  Filled 2021-01-04: qty 1

## 2021-01-04 MED ORDER — ACETAMINOPHEN 650 MG RE SUPP: 650.0000 mg | Freq: Four times a day (QID) | RECTAL | Status: DC | PRN

## 2021-01-04 MED ORDER — EZETIMIBE 10 MG PO TABS
10.0000 mg | ORAL_TABLET | Freq: Every day | ORAL | Status: DC
  Administered 2021-01-04 – 2021-01-06 (×3): 10 mg via ORAL
  Filled 2021-01-04 (×3): qty 1

## 2021-01-04 MED ORDER — LOPERAMIDE HCL 2 MG PO CAPS: 2.0000 mg | ORAL_CAPSULE | Freq: Four times a day (QID) | ORAL | Status: DC | PRN

## 2021-01-04 MED ORDER — ACETAMINOPHEN 325 MG PO TABS
650.0000 mg | ORAL_TABLET | Freq: Four times a day (QID) | ORAL | Status: DC | PRN
  Administered 2021-01-04 – 2021-01-05 (×3): 650 mg via ORAL
  Filled 2021-01-04 (×3): qty 2

## 2021-01-04 MED ORDER — LACTATED RINGERS IV SOLN: INTRAVENOUS | Status: DC

## 2021-01-04 MED ORDER — SODIUM CHLORIDE 0.9 % IV SOLN: INTRAVENOUS | Status: DC

## 2021-01-04 MED ORDER — ENOXAPARIN SODIUM 30 MG/0.3ML IJ SOSY
30.0000 mg | PREFILLED_SYRINGE | Freq: Every day | INTRAMUSCULAR | Status: DC
  Filled 2021-01-04: qty 0.3

## 2021-01-04 MED ORDER — ROPINIROLE HCL 1 MG PO TABS
0.5000 mg | ORAL_TABLET | Freq: Every day | ORAL | Status: DC
  Administered 2021-01-04 – 2021-01-05 (×3): 0.5 mg via ORAL
  Filled 2021-01-04 (×3): qty 1

## 2021-01-04 MED ORDER — LORAZEPAM 1 MG PO TABS
0.5000 mg | ORAL_TABLET | Freq: Every day | ORAL | Status: DC
  Administered 2021-01-05 – 2021-01-06 (×2): 1 mg via ORAL
  Filled 2021-01-04 (×3): qty 1

## 2021-01-04 MED ORDER — ALPRAZOLAM 0.25 MG PO TABS
1.0000 mg | ORAL_TABLET | Freq: Every day | ORAL | Status: DC
  Administered 2021-01-04: 1 mg via ORAL
  Administered 2021-01-04: 2 mg via ORAL
  Administered 2021-01-05: 1 mg via ORAL
  Filled 2021-01-04: qty 8
  Filled 2021-01-04 (×2): qty 4

## 2021-01-04 MED ORDER — CARVEDILOL 3.125 MG PO TABS
3.1250 mg | ORAL_TABLET | Freq: Two times a day (BID) | ORAL | Status: DC
  Administered 2021-01-04 – 2021-01-06 (×4): 3.125 mg via ORAL
  Filled 2021-01-04 (×5): qty 1

## 2021-01-04 MED ORDER — CLOPIDOGREL BISULFATE 75 MG PO TABS
75.0000 mg | ORAL_TABLET | Freq: Every day | ORAL | Status: DC
  Filled 2021-01-04: qty 1

## 2021-01-04 MED ORDER — ASPIRIN EC 81 MG PO TBEC
81.0000 mg | DELAYED_RELEASE_TABLET | Freq: Every evening | ORAL | Status: DC
  Administered 2021-01-04 – 2021-01-05 (×2): 81 mg via ORAL
  Filled 2021-01-04 (×2): qty 1

## 2021-01-04 MED ORDER — ONDANSETRON HCL 4 MG PO TABS: 4.0000 mg | ORAL_TABLET | Freq: Four times a day (QID) | ORAL | Status: DC | PRN

## 2021-01-04 MED ORDER — LACTATED RINGERS IV BOLUS
500.0000 mL | Freq: Once | INTRAVENOUS | Status: AC
  Administered 2021-01-04: 500 mL via INTRAVENOUS

## 2021-01-04 MED ORDER — POLYETHYLENE GLYCOL 3350 17 G PO PACK: 17.0000 g | PACK | Freq: Every day | ORAL | Status: DC | PRN

## 2021-01-04 MED ORDER — ATORVASTATIN CALCIUM 80 MG PO TABS
80.0000 mg | ORAL_TABLET | Freq: Every day | ORAL | Status: DC
  Administered 2021-01-04 – 2021-01-05 (×2): 80 mg via ORAL
  Filled 2021-01-04 (×2): qty 1
  Filled 2021-01-04: qty 2

## 2021-01-04 MED ORDER — NITROGLYCERIN 0.4 MG SL SUBL: 0.4000 mg | SUBLINGUAL_TABLET | SUBLINGUAL | Status: DC | PRN

## 2021-01-04 MED ORDER — ONDANSETRON HCL 4 MG/2ML IJ SOLN: 4.0000 mg | Freq: Four times a day (QID) | INTRAMUSCULAR | Status: DC | PRN

## 2021-01-04 MED ORDER — GABAPENTIN 100 MG PO CAPS
200.0000 mg | ORAL_CAPSULE | Freq: Every day | ORAL | Status: DC
  Administered 2021-01-04 – 2021-01-05 (×3): 200 mg via ORAL
  Filled 2021-01-04 (×3): qty 2

## 2021-01-04 MED ORDER — SODIUM CHLORIDE 0.9 % IV BOLUS
250.0000 mL | Freq: Once | INTRAVENOUS | Status: AC
  Administered 2021-01-04: 250 mL via INTRAVENOUS

## 2021-01-04 MED ORDER — PANTOPRAZOLE SODIUM 40 MG PO TBEC: 40.0000 mg | DELAYED_RELEASE_TABLET | Freq: Every day | ORAL | Status: DC

## 2021-01-04 MED ORDER — LACTATED RINGERS IV SOLN: INTRAVENOUS | Status: AC

## 2021-01-04 NOTE — ED Notes (Signed)
MD notified of asymptomatic hypotension as patient is easily aroused and denies any light-headedness or dizziness. LR bolus ordered.

## 2021-01-04 NOTE — Consult Note (Addendum)
[facility] Consult: 8:33 AM 01/04/2021  LOS: 0 days    Referring Provider: Dr  Marlyce Huge.   Primary Care Physician:  [name], MD Primary Gastroenterologist:  Dr Manus Rudd    Reason for Consultation:  Nausea, vomiting.     HPI: [name] is a 61 y.o. female. PMH ischemic CM.  CAD, PCIs/stents.  ICD 03/2020.  Chronic low-dose aspirin and Plavix.  EF 30 to [id number], no diastolic dysfunction on echo 03/2020.  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  Carotid stenosis HLD.  HTN.  GERD.  Treated with 14 days of oral vancomycin in fall 2019 for C. difficile toxin positive and GDH positive diarrhea.  anxiety.  Previous surgeries include but not limited to appendectomy.  Tubal ligation.  Vaginal hysterectomy.  Open cholecystectomy.  09/2018 EGD.  For dysphagia.  Small hiatal hernia.  Normal stomach and duodenum to D2.  Empiric Maloney dilation to 62 [place] with mild resistance. 09/2018 colonoscopy.  For chronic diarrhea.  Visually normal stomach other than a submucosal nodule in a sending colon, C/W lipoma.  Positive pillow sign.  Distal 5 cm of TI normal.  Segmental right and left colon biopsies obtained.  Query diarrhea due to postinfection IBS, R/oh microscopic colitis.  Pathology without significant pathology, no microscopic colitis.    Admission 6/13 -10/22/2020 with left-sided weakness.  CTA showed 70% left ICA stenosis.  MR showed remote infarct.  EF was 30%.  Groundglass nodule on chest CT was 7 mm, previously 6 mm, requires every 6 to 23-monthCT scans. Around that time she found 3 ticks on her.  Very soon after that she developed nausea and vomiting after eating a hamburger and suspected she had developed alpha gal allergy.  Her appetite diminished and she had early satiety  though until recent events she had not had recurrent nausea or vomiting.  However she has avoided red meat and pork since then with recent exception..   At some point her PCP treated her with a few days of antibiotics.  Her weight has gone from 127 #down to current 116 #.   Stool pattern is IBS-D type with days of loose brown stool followed by days without bowel movements.  Does not use NSAIDs.  Starting last Thursday, now 5 days ago, developed acute onset of nausea, nonbloody emesis.  This occurred several hours after eating a sausage biscuit at breakfast.  Last bowel movement was 4 days ago.  Seen at outside hospital ED and received IV Zofran and discharged with prescription for oral Zofran. CTAP w/O contrast: Adrenal adenomas.  Stomach, pancreas, small and large bowel unremarkable.  Changes consistent with previous cholecystectomy, hysterectomy and appendectomy.  BUN/creatinine 17?  1 >> 16/1.3.  GFR 34 >> 45.  LFTs normal.  Lipase 69.  WBCs normal.  Hb 11.7.  Platelets 359.  U/A bland.    Smokes 1 pack/day, drinks wine or beer if she goes out to dinner with her husband, probably 2 or 3 times a month at most.  Family history with maternal grandfather who  had tongue and esophageal cancer.  Past Medical History:  Diagnosis Date   Anxiety state 03/25/2016   Basal cell carcinoma of forehead    Brain aneurysm    CHF (congestive heart failure) (HCC)    Coronary artery disease    a. 03/11/16 PCI with DES-->Prox/Mid Cx;  b. 03/14/16 PCI with DES x2-->RCA, EF 30-35%.   Essential hypertension    GERD (gastroesophageal reflux disease)    HFrEF (heart failure with reduced ejection fraction) ([place])    a. 10/2016 Echo: EF 35-40%, Gr1 DD, mild focal basal septal hypertrophy, basal inflat, mid inflat, basal antlat AK. Mid infept/inf/antlat, apical lateral sev HK. Mod MR. mildly reduced RV fxn. Mild TR.   History of pneumonia    Hyperlipidemia    IBS (irritable bowel syndrome)    Ischemic cardiomyopathy     a. 10/2016 Echo: EF 35-40%, Gr1 DD.   Mitral regurgitation    NSTEMI (non-ST elevated myocardial infarction) ([place]) 03/10/2016   Pneumonia 03/2016   Squamous cell cancer of skin of nose    Thrombocytosis 03/26/2016   Tobacco abuse    Trichimoniasis    Wears dentures    Wears glasses     Past Surgical History:  Procedure Laterality Date   APPENDECTOMY     BIOPSY  09/20/2018   Procedure: BIOPSY;  Surgeon: Daneil Dolin, MD;  [facility];  Service: Endoscopy;;  colon   CARDIAC CATHETERIZATION N/A 03/11/2016   Procedure: Left Heart Cath and Coronary Angiography;  Surgeon: Leonie Man, MD;  Location: [place] [facility];  Service: Cardiovascular;  Laterality: N/A;   CARDIAC CATHETERIZATION N/A 03/11/2016   Procedure: Coronary Stent Intervention;  Surgeon: Leonie Man, MD;  Location: [place] [facility];  Service: Cardiovascular;  Laterality: N/A;   CARDIAC CATHETERIZATION N/A 03/14/2016   Procedure: Coronary Stent Intervention;  Surgeon: Peter M Martinique, MD;  Location: [place] [facility];  Service: Cardiovascular;  Laterality: N/A;   CHOLECYSTECTOMY OPEN  1984   COLONOSCOPY WITH PROPOFOL N/A 09/20/2018   Procedure: COLONOSCOPY WITH PROPOFOL;  Surgeon: Daneil Dolin, MD;  [facility];  Service: Endoscopy;  Laterality: N/A;  10:30am   CORONARY ANGIOPLASTY WITH STENT PLACEMENT  03/14/2016   ESOPHAGOGASTRODUODENOSCOPY (EGD) WITH PROPOFOL N/A 09/20/2018   Procedure: ESOPHAGOGASTRODUODENOSCOPY (EGD) WITH PROPOFOL;  Surgeon: Daneil Dolin, MD;  [facility];  Service: Endoscopy;  Laterality: N/A;   FINGER ARTHROPLASTY Left 05/14/2013   Procedure: LEFT THUMB CARPAL METACARPAL ARTHROPLASTY;  Surgeon: Tennis Must, MD;  Location: [place];  Service: Orthopedics;  Laterality: Left;   ICD IMPLANT N/A 04/03/2020   Procedure: ICD IMPLANT;  Surgeon: Constance Haw, MD;  Location: [place] [facility];  Service: Cardiovascular;  Laterality: N/A;    IR ANGIO INTRA EXTRACRAN SEL COM CAROTID INNOMINATE BILAT MOD SED  01/05/2017   IR ANGIO INTRA EXTRACRAN SEL COM CAROTID INNOMINATE BILAT MOD SED  03/19/2019   IR ANGIO INTRA EXTRACRAN SEL COM CAROTID INNOMINATE BILAT MOD SED  06/04/2020   IR ANGIO INTRA EXTRACRAN SEL INTERNAL CAROTID UNI L MOD SED  04/15/2019   IR ANGIO VERTEBRAL SEL VERTEBRAL BILAT MOD SED  01/05/2017   IR ANGIO VERTEBRAL SEL VERTEBRAL BILAT MOD SED  03/19/2019   IR ANGIO VERTEBRAL SEL VERTEBRAL UNI L MOD SED  06/04/2020   IR ANGIOGRAM FOLLOW UP STUDY  04/15/2019   IR RADIOLOGIST EVAL & MGMT  12/30/2016   IR TRANSCATH/EMBOLIZ  04/15/2019   IR  US GUIDE VASC ACCESS RIGHT  03/19/2019   IR US GUIDE VASC ACCESS RIGHT  06/04/2020   MALONEY DILATION N/A 09/20/2018   Procedure: Venia Minks DILATION;  Surgeon: Daneil Dolin, MD;  [facility];  Service: Endoscopy;  Laterality: N/A;   RADIOLOGY WITH ANESTHESIA N/A 04/15/2019   Procedure: [employer];  Surgeon: Luanne Bras, MD;  Location: [place];  Service: Radiology;  Laterality: N/A;   RIGHT/LEFT HEART CATH AND CORONARY ANGIOGRAPHY N/A 08/19/2019   Procedure: RIGHT/LEFT HEART CATH AND CORONARY ANGIOGRAPHY;  Surgeon: Larey Dresser, MD;  Location: [place] [facility];  Service: Cardiovascular;  Laterality: N/A;   TUBAL LIGATION  1987   VAGINAL HYSTERECTOMY  2009    Prior to Admission medications   Medication Sig Start Date End Date Taking? Authorizing Provider  acetaminophen (TYLENOL) 325 MG tablet Take 650 mg by mouth every 6 (six) hours as needed for mild pain or headache.    [provider]  ALPRAZolam Duanne Moron) 1 MG tablet Take 1-2 mg by mouth at bedtime.    [provider]  aspirin EC 81 MG tablet Take 81 mg by mouth every evening.     [provider]  atorvastatin (LIPITOR) 80 MG tablet Take 1 tablet (80 mg total) by mouth daily at 6 PM. 11/10/20   Larey Dresser, MD  carvedilol (COREG) 6.25 MG tablet Take 0.5 tablets (3.125 mg total) by mouth 2  (two) times daily. 11/13/20   Larey Dresser, MD  Cholecalciferol (VITAMIN D3) 10 MCG (400 UNIT) CAPS Take 1 capsule by mouth daily.    [provider]  clopidogrel (PLAVIX) 75 MG tablet Take 1 tablet (75 mg total) by mouth daily. 10/27/20   Louk, Bea Graff, PA-C  dapagliflozin propanediol (FARXIGA) 10 MG TABS tablet Take 1 tablet (10 mg total) by mouth daily before breakfast 11/10/20   Larey Dresser, MD  ezetimibe (ZETIA) 10 MG tablet Take 1 tablet (10 mg total) by mouth daily. 07/07/20   Larey Dresser, MD  furosemide (LASIX) 20 MG tablet Take 2 tablets (40 mg total) by mouth daily. 11/10/20 11/10/21  Larey Dresser, MD  gabapentin (NEURONTIN) 100 MG capsule Take 200 mg by mouth at bedtime.    [provider]  loperamide (IMODIUM) 2 MG capsule Take 2 mg by mouth 4 (four) times daily as needed for diarrhea or loose stools (ibs).     [provider]  LORazepam (ATIVAN) 1 MG tablet Take 0.5-1 tablets (0.5-1 mg total) by mouth daily. 09/15/20     Multiple Vitamins-Minerals (MULTIVITAMIN WITH MINERALS) tablet Take 1 tablet by mouth daily.    [provider]  nitroGLYCERIN (NITROSTAT) 0.4 MG SL tablet Place 1 tablet (0.4 mg total) under the tongue every 5 (five) minutes x 3 doses as needed for chest pain (if no relief after 2nd dose, proceed to the ED for an evaluation or call 911). 09/01/20   Verta Ellen., NP  omeprazole (PRILOSEC) 20 MG capsule Take 20 mg by mouth daily.    [provider]  ondansetron (ZOFRAN ODT) 4 MG disintegrating tablet Take 1 tablet (4 mg total) by mouth every 8 (eight) hours as needed for nausea or vomiting. 01/02/21   Myna Bright M, PA-C  rOPINIRole (REQUIP) 0.5 MG tablet Take 0.5 mg by mouth at bedtime.  11/13/17   [provider]  sacubitril-valsartan (ENTRESTO) 97-103 MG Take 1 tablet by mouth 2 (two) times daily. 08/17/20   Conrad Revloc, NP  spironolactone (ALDACTONE) 25 MG tablet Take 1/2 (one-half) tablet by mouth  once daily Patient taking differently: Take 12.5 mg by mouth daily. 12/10/20   Larey Dresser, MD  zinc gluconate 50 MG tablet Take 50 mg by mouth daily.    [provider]    Scheduled Meds:  ALPRAZolam  1-2 mg Oral QHS   aspirin EC  81 mg Oral QPM   atorvastatin  80 mg Oral q1800   carvedilol  3.125 mg Oral BID WC   clopidogrel  75 mg Oral Daily   enoxaparin (LOVENOX) injection  30 mg Subcutaneous Daily   ezetimibe  10 mg Oral Daily   gabapentin  200 mg Oral QHS   LORazepam  0.5-1 mg Oral Daily   pantoprazole (PROTONIX) IV  40 mg Intravenous Q12H   rOPINIRole  0.5 mg Oral QHS   Infusions:  sodium chloride     lactated ringers 100 mL/hr at 01/04/21 0232   PRN Meds: sodium chloride, acetaminophen **OR** acetaminophen, loperamide, nitroGLYCERIN, ondansetron **OR** ondansetron (ZOFRAN) IV, polyethylene glycol   Allergies as of 01/03/2021 - Review Complete 01/03/2021  Allergen Reaction Noted   Tape Other (See Comments) 01/03/2017    Family History  Problem Relation Age of Onset   Diabetes Father    Hypertension Father    CAD Father    Colon polyps Father 23       pre-cancerous    Stroke Father    Dementia Father    Stroke Mother    Hypertension Mother    Diabetes Mother    Heart failure Other    Breast cancer Maternal Grandmother    Colon cancer Neg Hx     Social History   Socioeconomic History   Marital status: Married    Spouse name: Not on file   Number of children: Not on file   Years of education: Not on file   Highest education level: Not on file  Occupational History   Occupation: CNA  Tobacco Use   Smoking status: Some Days    Packs/day: 0.10    Years: 15.00    Pack years: 1.50    Types: Cigarettes   Smokeless tobacco: Never   Tobacco comments:    smokes a cigarette occasionally  Vaping Use   Vaping Use: Never used  Substance and Sexual Activity   Alcohol use: Yes    Comment: occasionally   Drug use: Not Currently    Types:  Marijuana    Comment: former- 2017 last time   Sexual activity: Not Currently    Birth control/protection: Surgical    Comment: hyst  Other Topics Concern   Not on file  Social History Narrative   Lives with husband in [place] in a one story home with a basement.  Has 4 children.  Works as a Quarry manager.  Education: CNA school.    Social Determinants of Health   Financial Resource Strain: Not on file  Food Insecurity: Not on file  Transportation Needs: Not on file  Physical Activity: Not on file  Stress: Not on file  Social Connections: Not on file  Intimate Partner Violence: Not on file    REVIEW OF SYSTEMS: Constitutional: Feels tired, worn out. ENT:  No nose bleeds Pulm: Denies new shortness of breath or cough. CV:  No palpitations, no LE edema.  Some burning discomfort in her chest but not anginal pattern. GU:  No hematuria, no frequency GI: See HPI.  Denies dysphagia. Heme: Denies excessive bleeding but bruises easily  and bruises persist leading to discoloration of skin Transfusions: None per her recall. Neuro:  No headaches, no peripheral tingling or numbness.  Occasionally stumbles and falls.  No syncope.  No seizures. Derm:  No itching, no rash or sores.  Endocrine:  No sweats or chills.  No polyuria or dysuria Immunization: Not queried. Travel:  None beyond local counties in last few months.    PHYSICAL EXAM: Vital signs in last 24 hours: Vitals:   01/04/21 0730 01/04/21 0800  BP: 99/66 (!) 83/66  Pulse: 67 99  Resp: 17 19  Temp:    SpO2: 96% 95%   Wt Readings from Last 3 Encounters:  01/02/21 51.7 kg  12/13/20 52.6 kg  12/10/20 53.2 kg    General: Chronically ill-appearing but comfortable.  Not toxic. Head: No facial asymmetry or swelling.  No signs of head trauma. Eyes: No conjunctival pallor.  EOMI.  No scleral icterus Ears: Slightly hard of hearing Nose: No discharge or congestion. Mouth: Bulk of her teeth are absent, all upper teeth and many lower teeth  gone.  Remaining teeth with plaque buildup. Neck: No JVD, no masses, no thyromegaly Lungs: Clear bilaterally without labored breathing or cough Heart: RRR.  No MRG.  S1, S2 present Abdomen: Soft.  Not distended.  No bruits, HSM, masses, hernias.  Not tender.  Active bowel sounds Rectal: Deferred. Musc/Skeltl: No gross joint redness, swelling or deformities, no contractures. Extremities: No CCE. Neurologic: Speech is clear.  Follows commands.  Fully alert and oriented.  Moves all 4 limbs without tremors or gross deficits. Skin: Subcutaneous staining of the tissues on the left forearm. Tattoos: Multiple tattoos on arms, legs, trunk, professional grade appearance. Nodes: No cervical adenopathy Psych: Operative, pleasant, fluid speech.  Does not seem depressed, anxious, agitated  Intake/Output from previous day: No intake/output data recorded. Intake/Output this shift: No intake/output data recorded.  LAB RESULTS: Recent Labs    01/02/21 0855 01/03/21 1810 01/04/21 0131  WBC 8.1 8.5 8.0  HGB 13.6 13.0 11.7*  HCT 40.8 39.0 36.6  PLT 395 396 359   BMET Lab Results  Component Value Date   NA 140 01/04/2021   NA 137 01/03/2021   NA 136 01/02/2021   K 4.1 01/04/2021   K 3.1 (L) 01/03/2021   K 3.7 01/02/2021   CL 107 01/04/2021   CL 102 01/03/2021   CL 102 01/02/2021   CO2 24 01/04/2021   CO2 25 01/03/2021   CO2 26 01/02/2021   GLUCOSE 93 01/04/2021   GLUCOSE 108 (H) 01/03/2021   GLUCOSE 101 (H) 01/02/2021   BUN 16 01/04/2021   BUN 18 01/03/2021   BUN 17 01/02/2021   CREATININE 1.37 (H) 01/04/2021   CREATININE 1.72 (H) 01/03/2021   CREATININE 1.00 01/02/2021   CALCIUM 9.1 01/04/2021   CALCIUM 9.5 01/03/2021   CALCIUM 9.9 01/02/2021   LFT Recent Labs    01/02/21 0855 01/03/21 1810 01/04/21 0131  PROT 7.6 6.6 6.1*  ALBUMIN 4.3 3.6 3.2*  AST '24 19 17  '$ ALT '18 15 14  '$ ALKPHOS 76 65 56  BILITOT 1.4* 0.7 0.8   PT/INR Lab Results  Component Value Date   INR 0.9  10/19/2020   INR 0.9 06/04/2020   INR 0.9 04/15/2019   Hepatitis Panel No results for input(s): HEPBSAG, HCVAB, HEPAIGM, HEPBIGM in the last 72 hours. C-Diff No components found for: CDIFF Lipase     Component Value Date/Time   LIPASE 69 (H) 01/03/2021 1810    Drugs  of Abuse  No results found for: LABOPIA, COCAINSCRNUR, LABBENZ, AMPHETMU, THCU, LABBARB   RADIOLOGY STUDIES: CT ABDOMEN PELVIS WO CONTRAST  Result Date: 01/04/2021 CLINICAL DATA:  Left abdominal pain, nausea/vomiting EXAM: CT ABDOMEN AND PELVIS WITHOUT CONTRAST TECHNIQUE: Multidetector CT imaging of the abdomen and pelvis was performed following the standard protocol without IV contrast. COMPARISON:  02/17/2019 FINDINGS: Lower chest: Lung bases are clear. Hepatobiliary: Unenhanced liver is unremarkable. Status post cholecystectomy. No intrahepatic or extrahepatic ductal dilatation. Pancreas: Within normal limits. Spleen: Within normal limits. Adrenals/Urinary Tract: 2.5 cm left adrenal adenoma (series 3/image 16). 10 mm right adrenal adenoma (series 3/image 15). Kidneys are within normal limits. No renal calculi or hydronephrosis. Bladder is within normal limits. Stomach/Bowel: Stomach is within normal limits. No evidence of bowel obstruction. Appendix is not discretely visualized, reportedly surgically absent. No colonic wall thickening or inflammatory changes. Vascular/Lymphatic: No evidence of abdominal aortic aneurysm. Atherosclerotic calcifications of the abdominal aorta and branch vessels. No suspicious abdominopelvic lymphadenopathy. Reproductive: Status post hysterectomy. Right ovary is within normal limits.  No left adnexal mass. Other: No abdominopelvic ascites. Musculoskeletal: Visualized osseous structures are within normal limits. IMPRESSION: No CT findings to account for the patient's left abdominal pain. Bilateral adrenal adenomas, benign. Electronically Signed   By: Julian Hy M.D.   On: 01/04/2021 02:34   DG  Chest 2 View  Result Date: 01/03/2021 CLINICAL DATA:  Chest pressure. EXAM: CHEST - 2 VIEW COMPARISON:  December 13, 2020 FINDINGS: The single lead AICD device is in good position. The heart, hila, mediastinum, lungs, and pleura are otherwise unremarkable. No acute abnormalities. IMPRESSION: No active cardiopulmonary disease. Electronically Signed   By: Dorise Bullion III M.D.   On: 01/03/2021 19:48   DG Abdomen Acute W/Chest  Result Date: 01/02/2021 CLINICAL DATA:  Abdominal pain, diarrhea. EXAM: DG ABDOMEN ACUTE WITH 1 VIEW CHEST COMPARISON:  October 23, 2018. FINDINGS: There is no evidence of dilated bowel loops or free intraperitoneal air. No radiopaque calculi or other significant radiographic abnormality is seen. Heart size and mediastinal contours are within normal limits. Both lungs are clear. IMPRESSION: Negative abdominal radiographs.  No acute cardiopulmonary disease. Electronically Signed   By: Marijo Conception M.D.   On: 01/02/2021 09:27      IMPRESSION:   Nausea, nonbloody emesis.  Initial episode occurred several weeks ago after tick bite.  She suspected alpha gal allergy and avoided red meats, pork.  Developed recurrent nausea, emesis starting Thursday, several hours after having pork sausage in the morning.  CT abdomen pelvis unremarkable, unrevealing as to source of patient's symptoms.  Previous 09/2018 EGD showed a small hiatal hernia but otherwise normal study.  Dr. Gala Romney empirically dilated the esophagus as patient was complaining of dysphagia.  Patient has occasional dysphagia without regurgitation.  Not taking PPI though she has been prescribed omeprazole in the past.     CAD, CVD.  Still smokes.  Takes low-dose aspirin and Plavix but last Plavix was Thursday 8/25 when she ran out of this medication.    PLAN:        Supportive care. Dr Ardis Hughs will be seeing pt.     Azucena Freed  01/04/2021, 8:33 AM Phone [phone]  [phone]  ________________________________________________________________________  Velora Heckler GI MD note:  I personally examined the patient, reviewed the data and agree with the assessment and plan described above.  She's had intermittent N/V for 2 months. This seems to correlate with a tic bite 2 months ago and also red meat  intake since then however she really feels that many foods are making her nauseas, not just red meats. She's lost 20 pounds. CT scan today was unrevealing. I am planning EGD tomorrow for further workup. She is hungry this afternoon and I am allowing clears for now.  Owens Loffler, MD [facility] Pager [phone]

## 2021-01-04 NOTE — Progress Notes (Signed)
PROGRESS NOTE    Erin [name]  [id number] DOB: 03-01-1961 DOA: 01/03/2021 PCP: [name], MD   Brief Narrative: 60 year old with past medical history significant for ischemic cardiomyopathy with systolic and diastolic heart failure (ejection fraction 30 to 35% echo 2021, CAD s/p DES to proximal LC and PDA, GERD, generalized anxiety disorder, hyperlipidemia who presents with 4 days history of nausea and vomiting.  She reports a history of intermittent episode of nausea and vomiting since mid June.  She received 3 tick bites in section in mid June.  She thinks that may she have an alpha gal allergy.  She presented to the ED because she was having low blood pressure and was symptomatic.  Evaluation in the ED systolic blood pressure was in the 70s.  She received 500 cc IV bolus. Admitted with intractable nausea and vomiting and hypotension.   Assessment & Plan:   Principal Problem:   Intractable nausea and vomiting Active Problems:   Essential hypertension   Mixed hyperlipidemia   GERD without esophagitis   Chest pain   Coronary artery disease involving native coronary artery of native heart   Hypokalemia due to excessive gastrointestinal loss of potassium   Chronic combined systolic and diastolic congestive heart failure (HCC)   AKI (acute kidney injury) (HCC)   1-Intractable nausea and vomiting: -She has had intermittent episodes since 2 months ago, reports 10 pounds weight loss -Continue with supportive care, as needed antiemetic -GI has been consulted. -Elevation of lipase. -IV PPI -CT Abdomen and pelvis: No acute abnormalities.   2-Chest pain: Atypical: Possible esophageal spasm  versus related to vomiting Troponin x 2 negative.  3-AKI: Presents with Cr  of 1.7 Likely prerenal in the setting of hypovolemia, hypotension and vomiting On IV fluids Hold diuretics  Hypotension: In the setting of hypovolemia and Entresto Holding parameters for Coreg Plan to hold  Entresto Received  IV bolus. On IV fluids for 15 hours  Chronic combined systolic and diastolic heart failure: Currently compensated Monitor on IV fluid Hold diuretics (Entresto)   HLD: Continue with atorvastatin and Zetia  GERD: Continue with PPI  History of CAD: Continue with aspirin, statin  Holding Plavix for possible endoscopy  Hypokalemia: Replete IV    Estimated body mass index is 22.26 kg/m as calculated from the following:   Height as of 01/02/21: 5' (1.524 m).   Weight as of 01/02/21: 51.7 kg.   DVT prophylaxis: SCDs Code Status: Full code Family Communication: Discussed with patient Disposition Plan:  Status is: Observation  The patient will require care spanning > 2 midnights and should be moved to inpatient because: IV treatments appropriate due to intensity of illness or inability to take PO  Dispo: The patient is from: Home              Anticipated d/c is to: Home              Patient currently is not medically stable to d/c.   Difficult to place patient No        Consultants:  GI  Procedures:  None   Antimicrobials:    Subjective: He is alert and oriented, she is in Trendelenburg.  Her blood pressure is 85.  Plan to repeat IV bolus She denies dizziness, denies dyspnea  Objective: Vitals:   01/04/21 0730 01/04/21 0800 01/04/21 0830 01/04/21 0855  BP: 99/66 (!) 83/66 (!) 90/58 92/63  Pulse: 67 99 69 64  Resp: '17 19 16 16  '$ Temp:  TempSrc:      SpO2: 96% 95% 97% 96%   No intake or output data in the 24 hours ending 01/04/21 0924 There were no vitals filed for this visit.  Examination:  General exam: Appears calm and comfortable  Respiratory system: Clear to auscultation. Respiratory effort normal. Cardiovascular system: S1 & S2 heard, RRR. No JVD, murmurs, rubs, gallops or clicks. No pedal edema. Gastrointestinal system: Abdomen is nondistended, soft and nontender. No organomegaly or masses felt. Normal bowel sounds  heard. Central nervous system: Alert and oriented. Extremities: Symmetric 5 x 5 power.    Data Reviewed: I have personally reviewed following labs and imaging studies  CBC: Recent Labs  Lab 01/02/21 0855 01/03/21 1810 01/04/21 0131  WBC 8.1 8.5 8.0  NEUTROABS 4.9 4.7 4.1  HGB 13.6 13.0 11.7*  HCT 40.8 39.0 36.6  MCV 87.9 87.6 88.6  PLT 395 396 [id number]   Basic Metabolic Panel: Recent Labs  Lab 01/02/21 0855 01/03/21 1810 01/03/21 2130 01/04/21 0131  NA 136 137  --  140  K 3.7 3.1*  --  4.1  CL 102 102  --  107  CO2 26 25  --  24  GLUCOSE 101* 108*  --  93  BUN 17 18  --  16  CREATININE 1.00 1.72*  --  1.37*  CALCIUM 9.9 9.5  --  9.1  MG  --   --  2.1 2.3   GFR: Estimated Creatinine Clearance: 32.2 mL/min (A) (by C-G formula based on SCr of 1.37 mg/dL (H)). Liver Function Tests: Recent Labs  Lab 01/02/21 0855 01/03/21 1810 01/04/21 0131  AST '24 19 17  '$ ALT '18 15 14  '$ ALKPHOS 76 65 56  BILITOT 1.4* 0.7 0.8  PROT 7.6 6.6 6.1*  ALBUMIN 4.3 3.6 3.2*   Recent Labs  Lab 01/02/21 0855 01/03/21 1810  LIPASE 53* 69*   No results for input(s): AMMONIA in the last 168 hours. Coagulation Profile: No results for input(s): INR, PROTIME in the last 168 hours. Cardiac Enzymes: No results for input(s): CKTOTAL, CKMB, CKMBINDEX, TROPONINI in the last 168 hours. BNP (last 3 results) No results for input(s): PROBNP in the last 8760 hours. HbA1C: No results for input(s): HGBA1C in the last 72 hours. CBG: No results for input(s): GLUCAP in the last 168 hours. Lipid Profile: No results for input(s): CHOL, HDL, LDLCALC, TRIG, CHOLHDL, LDLDIRECT in the last 72 hours. Thyroid Function Tests: No results for input(s): TSH, T4TOTAL, FREET4, T3FREE, THYROIDAB in the last 72 hours. Anemia Panel: No results for input(s): VITAMINB12, FOLATE, FERRITIN, TIBC, IRON, RETICCTPCT in the last 72 hours. Sepsis Labs: No results for input(s): PROCALCITON, LATICACIDVEN in the last 168  hours.  Recent Results (from the past 240 hour(s))  SARS CORONAVIRUS 2 (TAT 6-24 HRS) Nasopharyngeal Nasopharyngeal Swab     Status: None   Collection Time: 01/02/21  9:01 AM   Specimen: Nasopharyngeal Swab  Result Value Ref Range Status   SARS Coronavirus 2 NEGATIVE NEGATIVE Final    Comment: (NOTE) SARS-CoV-2 target nucleic acids are NOT DETECTED.  The SARS-CoV-2 RNA is generally detectable in upper and lower respiratory specimens during the acute phase of infection. Negative results do not preclude SARS-CoV-2 infection, do not rule out co-infections with other pathogens, and should not be used as the sole basis for treatment or other patient management decisions. Negative results must be combined with clinical observations, patient history, and epidemiological information. The expected result is Negative.  Fact Sheet for Patients: SugarRoll.be  Fact Sheet for Healthcare Providers: https://www.woods-mathews.com/  This test is not yet approved or cleared by the [place] FDA and  has been authorized for detection and/or diagnosis of SARS-CoV-2 by FDA under an Emergency Use Authorization (EUA). This EUA will remain  in effect (meaning this test can be used) for the duration of the COVID-19 declaration under Se ction 564(b)(1) of the Act, 21 U.S.C. section 360bbb-3(b)(1), unless the authorization is terminated or revoked sooner.  Performed at [facility], [place] [address], [place], [place] [address]   Resp Panel by RT-PCR (Flu A&B, Covid)     Status: None   Collection Time: 01/03/21  9:00 PM   Specimen: Nasopharyngeal(NP) swabs in vial transport medium  Result Value Ref Range Status   SARS Coronavirus 2 by RT PCR NEGATIVE NEGATIVE Final    Comment: (NOTE) SARS-CoV-2 target nucleic acids are NOT DETECTED.  The SARS-CoV-2 RNA is generally detectable in upper respiratory specimens during the acute phase of infection. The  lowest concentration of SARS-CoV-2 viral copies this assay can detect is 138 copies/mL. A negative result does not preclude SARS-Cov-2 infection and should not be used as the sole basis for treatment or other patient management decisions. A negative result may occur with  improper specimen collection/handling, submission of specimen other than nasopharyngeal swab, presence of viral mutation(s) within the areas targeted by this assay, and inadequate number of viral copies(<138 copies/mL). A negative result must be combined with clinical observations, patient history, and epidemiological information. The expected result is Negative.  Fact Sheet for Patients:  EntrepreneurPulse.com.au  Fact Sheet for Healthcare Providers:  IncredibleEmployment.be  This test is no t yet approved or cleared by the [place] FDA and  has been authorized for detection and/or diagnosis of SARS-CoV-2 by FDA under an Emergency Use Authorization (EUA). This EUA will remain  in effect (meaning this test can be used) for the duration of the COVID-19 declaration under Section 564(b)(1) of the Act, 21 U.S.C.section 360bbb-3(b)(1), unless the authorization is terminated  or revoked sooner.       Influenza A by PCR NEGATIVE NEGATIVE Final   Influenza B by PCR NEGATIVE NEGATIVE Final    Comment: (NOTE) The Xpert Xpress SARS-CoV-2/FLU/RSV plus assay is intended as an aid in the diagnosis of influenza from Nasopharyngeal swab specimens and should not be used as a sole basis for treatment. Nasal washings and aspirates are unacceptable for Xpert Xpress SARS-CoV-2/FLU/RSV testing.  Fact Sheet for Patients: EntrepreneurPulse.com.au  Fact Sheet for Healthcare Providers: IncredibleEmployment.be  This test is not yet approved or cleared by the [place] FDA and has been authorized for detection and/or diagnosis of SARS-CoV-2 by FDA under  an Emergency Use Authorization (EUA). This EUA will remain in effect (meaning this test can be used) for the duration of the COVID-19 declaration under Section 564(b)(1) of the Act, 21 U.S.C. section 360bbb-3(b)(1), unless the authorization is terminated or revoked.  Performed at [facility], [place] [address] [place], [place] [address]          Radiology Studies: CT ABDOMEN PELVIS WO CONTRAST  Result Date: 01/04/2021 CLINICAL DATA:  Left abdominal pain, nausea/vomiting EXAM: CT ABDOMEN AND PELVIS WITHOUT CONTRAST TECHNIQUE: Multidetector CT imaging of the abdomen and pelvis was performed following the standard protocol without IV contrast. COMPARISON:  02/17/2019 FINDINGS: Lower chest: Lung bases are clear. Hepatobiliary: Unenhanced liver is unremarkable. Status post cholecystectomy. No intrahepatic or extrahepatic ductal dilatation. Pancreas: Within normal limits. Spleen: Within normal limits. Adrenals/Urinary  Tract: 2.5 cm left adrenal adenoma (series 3/image 16). 10 mm right adrenal adenoma (series 3/image 15). Kidneys are within normal limits. No renal calculi or hydronephrosis. Bladder is within normal limits. Stomach/Bowel: Stomach is within normal limits. No evidence of bowel obstruction. Appendix is not discretely visualized, reportedly surgically absent. No colonic wall thickening or inflammatory changes. Vascular/Lymphatic: No evidence of abdominal aortic aneurysm. Atherosclerotic calcifications of the abdominal aorta and branch vessels. No suspicious abdominopelvic lymphadenopathy. Reproductive: Status post hysterectomy. Right ovary is within normal limits.  No left adnexal mass. Other: No abdominopelvic ascites. Musculoskeletal: Visualized osseous structures are within normal limits. IMPRESSION: No CT findings to account for the patient's left abdominal pain. Bilateral adrenal adenomas, benign. Electronically Signed   By: Julian Hy M.D.   On: 01/04/2021 02:34   DG Chest  2 View  Result Date: 01/03/2021 CLINICAL DATA:  Chest pressure. EXAM: CHEST - 2 VIEW COMPARISON:  December 13, 2020 FINDINGS: The single lead AICD device is in good position. The heart, hila, mediastinum, lungs, and pleura are otherwise unremarkable. No acute abnormalities. IMPRESSION: No active cardiopulmonary disease. Electronically Signed   By: Dorise Bullion III M.D.   On: 01/03/2021 19:48        Scheduled Meds:  ALPRAZolam  1-2 mg Oral QHS   aspirin EC  81 mg Oral QPM   atorvastatin  80 mg Oral q1800   carvedilol  3.125 mg Oral BID WC   ezetimibe  10 mg Oral Daily   gabapentin  200 mg Oral QHS   LORazepam  0.5-1 mg Oral Daily   pantoprazole (PROTONIX) IV  40 mg Intravenous Q12H   rOPINIRole  0.5 mg Oral QHS   Continuous Infusions:  sodium chloride     lactated ringers 100 mL/hr at 01/04/21 0857   sodium chloride       LOS: 0 days    Time spent: 35 minutes    Windell Musson A Fredda Clarida, MD [facility]   If 7PM-7AM, please contact night-coverage [email or web address]  01/04/2021, 9:24 AM

## 2021-01-04 NOTE — Plan of Care (Signed)
  Problem: Health Behavior/Discharge Planning: Goal: Ability to manage health-related needs will improve Outcome: Progressing   

## 2021-01-04 NOTE — H&P (View-Only) (Signed)
[facility] Consult: 8:33 AM 01/04/2021  LOS: 0 days    Referring Provider: Dr  Marlyce Huge.   Primary Care Physician:  [name], MD Primary Gastroenterologist:  Dr Manus Rudd    Reason for Consultation:  Nausea, vomiting.     HPI: Erin [name] is a 60 y.o. female. PMH ischemic CM.  CAD, PCIs/stents.  ICD 03/2020.  Chronic low-dose aspirin and Plavix.  EF 30 to [id number], no diastolic dysfunction on echo 03/2020.  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  Carotid stenosis HLD.  HTN.  GERD.  Treated with 14 days of oral vancomycin in fall 2019 for C. difficile toxin positive and GDH positive diarrhea.  anxiety.  Previous surgeries include but not limited to appendectomy.  Tubal ligation.  Vaginal hysterectomy.  Open cholecystectomy.  09/2018 EGD.  For dysphagia.  Small hiatal hernia.  Normal stomach and duodenum to D2.  Empiric Maloney dilation to 81 [place] with mild resistance. 09/2018 colonoscopy.  For chronic diarrhea.  Visually normal stomach other than a submucosal nodule in a sending colon, C/W lipoma.  Positive pillow sign.  Distal 5 cm of TI normal.  Segmental right and left colon biopsies obtained.  Query diarrhea due to postinfection IBS, R/oh microscopic colitis.  Pathology without significant pathology, no microscopic colitis.    Admission 6/13 -10/22/2020 with left-sided weakness.  CTA showed 70% left ICA stenosis.  MR showed remote infarct.  EF was 30%.  Groundglass nodule on chest CT was 7 mm, previously 6 mm, requires every 6 to 39-monthCT scans. Around that time she found 3 ticks on her.  Very soon after that she developed nausea and vomiting after eating a hamburger and suspected she had developed alpha gal allergy.  Her appetite diminished and she had early satiety  though until recent events she had not had recurrent nausea or vomiting.  However she has avoided red meat and pork since then with recent exception..   At some point her PCP treated her with a few days of antibiotics.  Her weight has gone from 127 #down to current 116 #.   Stool pattern is IBS-D type with days of loose brown stool followed by days without bowel movements.  Does not use NSAIDs.  Starting last Thursday, now 5 days ago, developed acute onset of nausea, nonbloody emesis.  This occurred several hours after eating a sausage biscuit at breakfast.  Last bowel movement was 4 days ago.  Seen at outside hospital ED and received IV Zofran and discharged with prescription for oral Zofran. CTAP w/O contrast: Adrenal adenomas.  Stomach, pancreas, small and large bowel unremarkable.  Changes consistent with previous cholecystectomy, hysterectomy and appendectomy.  BUN/creatinine 17?  1 >> 16/1.3.  GFR 34 >> 45.  LFTs normal.  Lipase 69.  WBCs normal.  Hb 11.7.  Platelets 359.  U/A bland.    Smokes 1 pack/day, drinks wine or beer if she goes out to dinner with her husband, probably 2 or 3 times a month at most.  Family history with maternal grandfather who  had tongue and esophageal cancer.  Past Medical History:  Diagnosis Date   Anxiety state 03/25/2016   Basal cell carcinoma of forehead    Brain aneurysm    CHF (congestive heart failure) (HCC)    Coronary artery disease    a. 03/11/16 PCI with DES-->Prox/Mid Cx;  b. 03/14/16 PCI with DES x2-->RCA, EF 30-35%.   Essential hypertension    GERD (gastroesophageal reflux disease)    HFrEF (heart failure with reduced ejection fraction) ([place])    a. 10/2016 Echo: EF 35-40%, Gr1 DD, mild focal basal septal hypertrophy, basal inflat, mid inflat, basal antlat AK. Mid infept/inf/antlat, apical lateral sev HK. Mod MR. mildly reduced RV fxn. Mild TR.   History of pneumonia    Hyperlipidemia    IBS (irritable bowel syndrome)    Ischemic cardiomyopathy     a. 10/2016 Echo: EF 35-40%, Gr1 DD.   Mitral regurgitation    NSTEMI (non-ST elevated myocardial infarction) ([place]) 03/10/2016   Pneumonia 03/2016   Squamous cell cancer of skin of nose    Thrombocytosis 03/26/2016   Tobacco abuse    Trichimoniasis    Wears dentures    Wears glasses     Past Surgical History:  Procedure Laterality Date   APPENDECTOMY     BIOPSY  09/20/2018   Procedure: BIOPSY;  Surgeon: Daneil Dolin, MD;  [facility];  Service: Endoscopy;;  colon   CARDIAC CATHETERIZATION N/A 03/11/2016   Procedure: Left Heart Cath and Coronary Angiography;  Surgeon: Leonie Man, MD;  Location: [facility];  Service: Cardiovascular;  Laterality: N/A;   CARDIAC CATHETERIZATION N/A 03/11/2016   Procedure: Coronary Stent Intervention;  Surgeon: Leonie Man, MD;  Location: [facility];  Service: Cardiovascular;  Laterality: N/A;   CARDIAC CATHETERIZATION N/A 03/14/2016   Procedure: Coronary Stent Intervention;  Surgeon: Peter M Martinique, MD;  Location: [facility];  Service: Cardiovascular;  Laterality: N/A;   CHOLECYSTECTOMY OPEN  1984   COLONOSCOPY WITH PROPOFOL N/A 09/20/2018   Procedure: COLONOSCOPY WITH PROPOFOL;  Surgeon: Daneil Dolin, MD;  [facility];  Service: Endoscopy;  Laterality: N/A;  10:30am   CORONARY ANGIOPLASTY WITH STENT PLACEMENT  03/14/2016   ESOPHAGOGASTRODUODENOSCOPY (EGD) WITH PROPOFOL N/A 09/20/2018   Procedure: ESOPHAGOGASTRODUODENOSCOPY (EGD) WITH PROPOFOL;  Surgeon: Daneil Dolin, MD;  [facility];  Service: Endoscopy;  Laterality: N/A;   FINGER ARTHROPLASTY Left 05/14/2013   Procedure: LEFT THUMB CARPAL METACARPAL ARTHROPLASTY;  Surgeon: Tennis Must, MD;  Location: [facility];  Service: Orthopedics;  Laterality: Left;   ICD IMPLANT N/A 04/03/2020   Procedure: ICD IMPLANT;  Surgeon: Constance Haw, MD;  Location: [facility];  Service: Cardiovascular;  Laterality: N/A;    IR ANGIO INTRA EXTRACRAN SEL COM CAROTID INNOMINATE BILAT MOD SED  01/05/2017   IR ANGIO INTRA EXTRACRAN SEL COM CAROTID INNOMINATE BILAT MOD SED  03/19/2019   IR ANGIO INTRA EXTRACRAN SEL COM CAROTID INNOMINATE BILAT MOD SED  06/04/2020   IR ANGIO INTRA EXTRACRAN SEL INTERNAL CAROTID UNI L MOD SED  04/15/2019   IR ANGIO VERTEBRAL SEL VERTEBRAL BILAT MOD SED  01/05/2017   IR ANGIO VERTEBRAL SEL VERTEBRAL BILAT MOD SED  03/19/2019   IR ANGIO VERTEBRAL SEL VERTEBRAL UNI L MOD SED  06/04/2020   IR ANGIOGRAM FOLLOW UP STUDY  04/15/2019   IR RADIOLOGIST EVAL & MGMT  12/30/2016   IR TRANSCATH/EMBOLIZ  04/15/2019   IR  US GUIDE VASC ACCESS RIGHT  03/19/2019   IR US GUIDE VASC ACCESS RIGHT  06/04/2020   MALONEY DILATION N/A 09/20/2018   Procedure: Venia Minks DILATION;  Surgeon: Daneil Dolin, MD;  [facility];  Service: Endoscopy;  Laterality: N/A;   RADIOLOGY WITH ANESTHESIA N/A 04/15/2019   Procedure: [employer];  Surgeon: Luanne Bras, MD;  Location: [facility];  Service: Radiology;  Laterality: N/A;   RIGHT/LEFT HEART CATH AND CORONARY ANGIOGRAPHY N/A 08/19/2019   Procedure: RIGHT/LEFT HEART CATH AND CORONARY ANGIOGRAPHY;  Surgeon: Larey Dresser, MD;  Location: [facility];  Service: Cardiovascular;  Laterality: N/A;   TUBAL LIGATION  1987   VAGINAL HYSTERECTOMY  2009    Prior to Admission medications   Medication Sig Start Date End Date Taking? Authorizing Provider  acetaminophen (TYLENOL) 325 MG tablet Take 650 mg by mouth every 6 (six) hours as needed for mild pain or headache.    [provider]  ALPRAZolam Duanne Moron) 1 MG tablet Take 1-2 mg by mouth at bedtime.    [provider]  aspirin EC 81 MG tablet Take 81 mg by mouth every evening.     [provider]  atorvastatin (LIPITOR) 80 MG tablet Take 1 tablet (80 mg total) by mouth daily at 6 PM. 11/10/20   Larey Dresser, MD  carvedilol (COREG) 6.25 MG tablet Take 0.5 tablets (3.125 mg total) by mouth 2  (two) times daily. 11/13/20   Larey Dresser, MD  Cholecalciferol (VITAMIN D3) 10 MCG (400 UNIT) CAPS Take 1 capsule by mouth daily.    [provider]  clopidogrel (PLAVIX) 75 MG tablet Take 1 tablet (75 mg total) by mouth daily. 10/27/20   Louk, Bea Graff, PA-C  dapagliflozin propanediol (FARXIGA) 10 MG TABS tablet Take 1 tablet (10 mg total) by mouth daily before breakfast 11/10/20   Larey Dresser, MD  ezetimibe (ZETIA) 10 MG tablet Take 1 tablet (10 mg total) by mouth daily. 07/07/20   Larey Dresser, MD  furosemide (LASIX) 20 MG tablet Take 2 tablets (40 mg total) by mouth daily. 11/10/20 11/10/21  Larey Dresser, MD  gabapentin (NEURONTIN) 100 MG capsule Take 200 mg by mouth at bedtime.    [provider]  loperamide (IMODIUM) 2 MG capsule Take 2 mg by mouth 4 (four) times daily as needed for diarrhea or loose stools (ibs).     [provider]  LORazepam (ATIVAN) 1 MG tablet Take 0.5-1 tablets (0.5-1 mg total) by mouth daily. 09/15/20     Multiple Vitamins-Minerals (MULTIVITAMIN WITH MINERALS) tablet Take 1 tablet by mouth daily.    [provider]  nitroGLYCERIN (NITROSTAT) 0.4 MG SL tablet Place 1 tablet (0.4 mg total) under the tongue every 5 (five) minutes x 3 doses as needed for chest pain (if no relief after 2nd dose, proceed to the ED for an evaluation or call 911). 09/01/20   Verta Ellen., NP  omeprazole (PRILOSEC) 20 MG capsule Take 20 mg by mouth daily.    [provider]  ondansetron (ZOFRAN ODT) 4 MG disintegrating tablet Take 1 tablet (4 mg total) by mouth every 8 (eight) hours as needed for nausea or vomiting. 01/02/21   Myna Bright M, PA-C  rOPINIRole (REQUIP) 0.5 MG tablet Take 0.5 mg by mouth at bedtime.  11/13/17   [provider]  sacubitril-valsartan (ENTRESTO) 97-103 MG Take 1 tablet by mouth 2 (two) times daily. 08/17/20   Conrad Cresco, NP  spironolactone (ALDACTONE) 25 MG tablet Take 1/2 (one-half) tablet by mouth  once daily Patient taking differently: Take 12.5 mg by mouth daily. 12/10/20   Larey Dresser, MD  zinc gluconate 50 MG tablet Take 50 mg by mouth daily.    [provider]    Scheduled Meds:  ALPRAZolam  1-2 mg Oral QHS   aspirin EC  81 mg Oral QPM   atorvastatin  80 mg Oral q1800   carvedilol  3.125 mg Oral BID WC   clopidogrel  75 mg Oral Daily   enoxaparin (LOVENOX) injection  30 mg Subcutaneous Daily   ezetimibe  10 mg Oral Daily   gabapentin  200 mg Oral QHS   LORazepam  0.5-1 mg Oral Daily   pantoprazole (PROTONIX) IV  40 mg Intravenous Q12H   rOPINIRole  0.5 mg Oral QHS   Infusions:  sodium chloride     lactated ringers 100 mL/hr at 01/04/21 0232   PRN Meds: sodium chloride, acetaminophen **OR** acetaminophen, loperamide, nitroGLYCERIN, ondansetron **OR** ondansetron (ZOFRAN) IV, polyethylene glycol   Allergies as of 01/03/2021 - Review Complete 01/03/2021  Allergen Reaction Noted   Tape Other (See Comments) 01/03/2017    Family History  Problem Relation Age of Onset   Diabetes Father    Hypertension Father    CAD Father    Colon polyps Father 73       pre-cancerous    Stroke Father    Dementia Father    Stroke Mother    Hypertension Mother    Diabetes Mother    Heart failure Other    Breast cancer Maternal Grandmother    Colon cancer Neg Hx     Social History   Socioeconomic History   Marital status: Married    Spouse name: Not on file   Number of children: Not on file   Years of education: Not on file   Highest education level: Not on file  Occupational History   Occupation: CNA  Tobacco Use   Smoking status: Some Days    Packs/day: 0.10    Years: 15.00    Pack years: 1.50    Types: Cigarettes   Smokeless tobacco: Never   Tobacco comments:    smokes a cigarette occasionally  Vaping Use   Vaping Use: Never used  Substance and Sexual Activity   Alcohol use: Yes    Comment: occasionally   Drug use: Not Currently    Types:  Marijuana    Comment: former- 2017 last time   Sexual activity: Not Currently    Birth control/protection: Surgical    Comment: hyst  Other Topics Concern   Not on file  Social History Narrative   Lives with husband in [place] in a one story home with a basement.  Has 4 children.  Works as a Quarry manager.  Education: CNA school.    Social Determinants of Health   Financial Resource Strain: Not on file  Food Insecurity: Not on file  Transportation Needs: Not on file  Physical Activity: Not on file  Stress: Not on file  Social Connections: Not on file  Intimate Partner Violence: Not on file    REVIEW OF SYSTEMS: Constitutional: Feels tired, worn out. ENT:  No nose bleeds Pulm: Denies new shortness of breath or cough. CV:  No palpitations, no LE edema.  Some burning discomfort in her chest but not anginal pattern. GU:  No hematuria, no frequency GI: See HPI.  Denies dysphagia. Heme: Denies excessive bleeding but bruises easily  and bruises persist leading to discoloration of skin Transfusions: None per her recall. Neuro:  No headaches, no peripheral tingling or numbness.  Occasionally stumbles and falls.  No syncope.  No seizures. Derm:  No itching, no rash or sores.  Endocrine:  No sweats or chills.  No polyuria or dysuria Immunization: Not queried. Travel:  None beyond local counties in last few months.    PHYSICAL EXAM: Vital signs in last 24 hours: Vitals:   01/04/21 0730 01/04/21 0800  BP: 99/66 (!) 83/66  Pulse: 67 99  Resp: 17 19  Temp:    SpO2: 96% 95%   Wt Readings from Last 3 Encounters:  01/02/21 51.7 kg  12/13/20 52.6 kg  12/10/20 53.2 kg    General: Chronically ill-appearing but comfortable.  Not toxic. Head: No facial asymmetry or swelling.  No signs of head trauma. Eyes: No conjunctival pallor.  EOMI.  No scleral icterus Ears: Slightly hard of hearing Nose: No discharge or congestion. Mouth: Bulk of her teeth are absent, all upper teeth and many lower teeth  gone.  Remaining teeth with plaque buildup. Neck: No JVD, no masses, no thyromegaly Lungs: Clear bilaterally without labored breathing or cough Heart: RRR.  No MRG.  S1, S2 present Abdomen: Soft.  Not distended.  No bruits, HSM, masses, hernias.  Not tender.  Active bowel sounds Rectal: Deferred. Musc/Skeltl: No gross joint redness, swelling or deformities, no contractures. Extremities: No CCE. Neurologic: Speech is clear.  Follows commands.  Fully alert and oriented.  Moves all 4 limbs without tremors or gross deficits. Skin: Subcutaneous staining of the tissues on the left forearm. Tattoos: Multiple tattoos on arms, legs, trunk, professional grade appearance. Nodes: No cervical adenopathy Psych: Operative, pleasant, fluid speech.  Does not seem depressed, anxious, agitated  Intake/Output from previous day: No intake/output data recorded. Intake/Output this shift: No intake/output data recorded.  LAB RESULTS: Recent Labs    01/02/21 0855 01/03/21 1810 01/04/21 0131  WBC 8.1 8.5 8.0  HGB 13.6 13.0 11.7*  HCT 40.8 39.0 36.6  PLT 395 396 359   BMET Lab Results  Component Value Date   NA 140 01/04/2021   NA 137 01/03/2021   NA 136 01/02/2021   K 4.1 01/04/2021   K 3.1 (L) 01/03/2021   K 3.7 01/02/2021   CL 107 01/04/2021   CL 102 01/03/2021   CL 102 01/02/2021   CO2 24 01/04/2021   CO2 25 01/03/2021   CO2 26 01/02/2021   GLUCOSE 93 01/04/2021   GLUCOSE 108 (H) 01/03/2021   GLUCOSE 101 (H) 01/02/2021   BUN 16 01/04/2021   BUN 18 01/03/2021   BUN 17 01/02/2021   CREATININE 1.37 (H) 01/04/2021   CREATININE 1.72 (H) 01/03/2021   CREATININE 1.00 01/02/2021   CALCIUM 9.1 01/04/2021   CALCIUM 9.5 01/03/2021   CALCIUM 9.9 01/02/2021   LFT Recent Labs    01/02/21 0855 01/03/21 1810 01/04/21 0131  PROT 7.6 6.6 6.1*  ALBUMIN 4.3 3.6 3.2*  AST '24 19 17  '$ ALT '18 15 14  '$ ALKPHOS 76 65 56  BILITOT 1.4* 0.7 0.8   PT/INR Lab Results  Component Value Date   INR 0.9  10/19/2020   INR 0.9 06/04/2020   INR 0.9 04/15/2019   Hepatitis Panel No results for input(s): HEPBSAG, HCVAB, HEPAIGM, HEPBIGM in the last 72 hours. C-Diff No components found for: CDIFF Lipase     Component Value Date/Time   LIPASE 69 (H) 01/03/2021 1810    Drugs  of Abuse  No results found for: LABOPIA, COCAINSCRNUR, LABBENZ, AMPHETMU, THCU, LABBARB   RADIOLOGY STUDIES: CT ABDOMEN PELVIS WO CONTRAST  Result Date: 01/04/2021 CLINICAL DATA:  Left abdominal pain, nausea/vomiting EXAM: CT ABDOMEN AND PELVIS WITHOUT CONTRAST TECHNIQUE: Multidetector CT imaging of the abdomen and pelvis was performed following the standard protocol without IV contrast. COMPARISON:  02/17/2019 FINDINGS: Lower chest: Lung bases are clear. Hepatobiliary: Unenhanced liver is unremarkable. Status post cholecystectomy. No intrahepatic or extrahepatic ductal dilatation. Pancreas: Within normal limits. Spleen: Within normal limits. Adrenals/Urinary Tract: 2.5 cm left adrenal adenoma (series 3/image 16). 10 mm right adrenal adenoma (series 3/image 15). Kidneys are within normal limits. No renal calculi or hydronephrosis. Bladder is within normal limits. Stomach/Bowel: Stomach is within normal limits. No evidence of bowel obstruction. Appendix is not discretely visualized, reportedly surgically absent. No colonic wall thickening or inflammatory changes. Vascular/Lymphatic: No evidence of abdominal aortic aneurysm. Atherosclerotic calcifications of the abdominal aorta and branch vessels. No suspicious abdominopelvic lymphadenopathy. Reproductive: Status post hysterectomy. Right ovary is within normal limits.  No left adnexal mass. Other: No abdominopelvic ascites. Musculoskeletal: Visualized osseous structures are within normal limits. IMPRESSION: No CT findings to account for the patient's left abdominal pain. Bilateral adrenal adenomas, benign. Electronically Signed   By: Julian Hy M.D.   On: 01/04/2021 02:34   DG  Chest 2 View  Result Date: 01/03/2021 CLINICAL DATA:  Chest pressure. EXAM: CHEST - 2 VIEW COMPARISON:  December 13, 2020 FINDINGS: The single lead AICD device is in good position. The heart, hila, mediastinum, lungs, and pleura are otherwise unremarkable. No acute abnormalities. IMPRESSION: No active cardiopulmonary disease. Electronically Signed   By: Dorise Bullion III M.D.   On: 01/03/2021 19:48   DG Abdomen Acute W/Chest  Result Date: 01/02/2021 CLINICAL DATA:  Abdominal pain, diarrhea. EXAM: DG ABDOMEN ACUTE WITH 1 VIEW CHEST COMPARISON:  October 23, 2018. FINDINGS: There is no evidence of dilated bowel loops or free intraperitoneal air. No radiopaque calculi or other significant radiographic abnormality is seen. Heart size and mediastinal contours are within normal limits. Both lungs are clear. IMPRESSION: Negative abdominal radiographs.  No acute cardiopulmonary disease. Electronically Signed   By: Marijo Conception M.D.   On: 01/02/2021 09:27      IMPRESSION:   Nausea, nonbloody emesis.  Initial episode occurred several weeks ago after tick bite.  She suspected alpha gal allergy and avoided red meats, pork.  Developed recurrent nausea, emesis starting Thursday, several hours after having pork sausage in the morning.  CT abdomen pelvis unremarkable, unrevealing as to source of patient's symptoms.  Previous 09/2018 EGD showed a small hiatal hernia but otherwise normal study.  Dr. Gala Romney empirically dilated the esophagus as patient was complaining of dysphagia.  Patient has occasional dysphagia without regurgitation.  Not taking PPI though she has been prescribed omeprazole in the past.     CAD, CVD.  Still smokes.  Takes low-dose aspirin and Plavix but last Plavix was Thursday 8/25 when she ran out of this medication.    PLAN:        Supportive care. Dr Ardis Hughs will be seeing pt.     Azucena Freed  01/04/2021, 8:33 AM Phone [phone]  [phone]  ________________________________________________________________________  Erin [name] GI MD note:  I personally examined the patient, reviewed the data and agree with the assessment and plan described above.  She's had intermittent N/V for 2 months. This seems to correlate with a tic bite 2 months ago and also red meat  intake since then however she really feels that many foods are making her nauseas, not just red meats. She's lost 20 pounds. CT scan today was unrevealing. I am planning EGD tomorrow for further workup. She is hungry this afternoon and I am allowing clears for now.  Owens Loffler, MD [facility] Pager [phone]

## 2021-01-04 NOTE — H&P (Signed)
History and Physical    [name] [id number] DOB: 1960-11-27 DOA: 01/03/2021  PCP: [name], MD  Patient coming from: [facility]   Chief Complaint: vomiting    HPI:    60 year old female with past medical history of ischemic cardiomyopathy with systolic and diastolic congestive heart failure (Echo 03/2020 EF 30-35% with G2DD), coronary artery disease (S/P DES to proximal Lcx and PDA, last cath 08/2019 revealing patent stents and minimal disease), lest posterior communicating artery brain aneurysm (S/P embolization 04/2019),  gastroesophageal reflux disease, nicotine dependence, generalized anxiety disorder, hyperlipidemia who presents to [facility] emergency department with a 4-day history of nausea and vomiting.  Patient explains that ever since approximately mid June she has been experiencing intense episodes of nausea and vomiting.  Patient explains these episodes typically lasts between 2 and 3 days.  Vomitus is typically nonbilious and nonbloody.  She noticed that she was beginning to experience these episodes shortly after she received 3 tick bites in succession in mid June.  She and her husband have both come to the conclusion that she may have an alpha gal allergy and as a result she has attempted to avoid beef and pork since.  Patient explains that over this 59-monthspan of time she has been gradually losing weight and has lost over 10 pounds.  States that she has had a significant decrease in appetite over the span of time.  Patient complains of vague waxing and waning generalized abdominal discomfort but is unable to describe this further.  Patient denies any melena, hematochezia or hematemesis.  Patient explains that on Thursday morning she ate a sausage biscuit at a [employer] within several hours after ingesting it she began to experience intense nausea with frequent bouts of nausea and vomiting.  Patient continued to experience frequent bouts of nausea and  vomiting, upwards of 5 times daily over the next several days.  [facility]  After several hours of evaluation that was essentially negative patient was discharged home.  The morning of 8/28, patient checked her blood pressure and noticed that her systolic blood pressures were in the 70s and 80s.  Patient was additionally experiencing lightheadedness.  This [facility].  Upon evaluation in the emergency room initial blood pressures were as low 71/45.  Patient was administered a 500 cc bolus of normal saline.  Patient was noted to clinically look volume depleted and also possess hypokalemia and a mild acute kidney injury with creatinine increasing from 1.0 yesterday to 1.72 today.  The hospitalist group was then called to assess patient for admission to the hospital due to concerns for continued intractable nausea and vomiting, hypotension and concerns for developing acute kidney injury.     Review of Systems:   Review of Systems  Constitutional:  Positive for malaise/fatigue.  Gastrointestinal:  Positive for abdominal pain, diarrhea, nausea and vomiting.  Neurological:  Positive for dizziness and weakness.  All other systems reviewed and are negative.  Past Medical History:  Diagnosis Date   Anxiety state 03/25/2016   Basal cell carcinoma of forehead    Brain aneurysm    CHF (congestive heart failure) (HCC)    Coronary artery disease    a. 03/11/16 PCI with DES-->Prox/Mid Cx;  b. 03/14/16 PCI with DES x2-->RCA, EF 30-35%.   Essential hypertension    GERD (gastroesophageal reflux disease)    HFrEF (heart failure with reduced  ejection fraction) ([place])    a. 10/2016 Echo: EF 35-40%, Gr1 DD, mild focal basal septal hypertrophy, basal inflat, mid inflat, basal antlat AK. Mid infept/inf/antlat, apical lateral sev HK. Mod MR. mildly reduced RV fxn. Mild TR.   History of pneumonia     Hyperlipidemia    IBS (irritable bowel syndrome)    Ischemic cardiomyopathy    a. 10/2016 Echo: EF 35-40%, Gr1 DD.   Mitral regurgitation    NSTEMI (non-ST elevated myocardial infarction) ([place]) 03/10/2016   Pneumonia 03/2016   Squamous cell cancer of skin of nose    Thrombocytosis 03/26/2016   Tobacco abuse    Trichimoniasis    Wears dentures    Wears glasses     Past Surgical History:  Procedure Laterality Date   APPENDECTOMY     BIOPSY  09/20/2018   Procedure: BIOPSY;  Surgeon: Daneil Dolin, MD;  [facility];  Service: Endoscopy;;  colon   CARDIAC CATHETERIZATION N/A 03/11/2016   Procedure: Left Heart Cath and Coronary Angiography;  Surgeon: Leonie Man, MD;  Location: [place] CV LAB;  Service: Cardiovascular;  Laterality: N/A;   CARDIAC CATHETERIZATION N/A 03/11/2016   Procedure: Coronary Stent Intervention;  Surgeon: Leonie Man, MD;  Location: [place] CV LAB;  Service: Cardiovascular;  Laterality: N/A;   CARDIAC CATHETERIZATION N/A 03/14/2016   Procedure: Coronary Stent Intervention;  Surgeon: Peter M Martinique, MD;  Location: [place] CV LAB;  Service: Cardiovascular;  Laterality: N/A;   CHOLECYSTECTOMY OPEN  1984   COLONOSCOPY WITH PROPOFOL N/A 09/20/2018   Procedure: COLONOSCOPY WITH PROPOFOL;  Surgeon: Daneil Dolin, MD;  [facility];  Service: Endoscopy;  Laterality: N/A;  10:30am   CORONARY ANGIOPLASTY WITH STENT PLACEMENT  03/14/2016   ESOPHAGOGASTRODUODENOSCOPY (EGD) WITH PROPOFOL N/A 09/20/2018   Procedure: ESOPHAGOGASTRODUODENOSCOPY (EGD) WITH PROPOFOL;  Surgeon: Daneil Dolin, MD;  [facility];  Service: Endoscopy;  Laterality: N/A;   FINGER ARTHROPLASTY Left 05/14/2013   Procedure: LEFT THUMB CARPAL METACARPAL ARTHROPLASTY;  Surgeon: Tennis Must, MD;  Location: [place];  Service: Orthopedics;  Laterality: Left;   ICD IMPLANT N/A 04/03/2020   Procedure: ICD IMPLANT;  Surgeon: Constance Haw,  MD;  Location: [place] [facility] LAB;  Service: Cardiovascular;  Laterality: N/A;   IR ANGIO INTRA EXTRACRAN SEL COM CAROTID INNOMINATE BILAT MOD SED  01/05/2017   IR ANGIO INTRA EXTRACRAN SEL COM CAROTID INNOMINATE BILAT MOD SED  03/19/2019   IR ANGIO INTRA EXTRACRAN SEL COM CAROTID INNOMINATE BILAT MOD SED  06/04/2020   IR ANGIO INTRA EXTRACRAN SEL INTERNAL CAROTID UNI L MOD SED  04/15/2019   IR ANGIO VERTEBRAL SEL VERTEBRAL BILAT MOD SED  01/05/2017   IR ANGIO VERTEBRAL SEL VERTEBRAL BILAT MOD SED  03/19/2019   IR ANGIO VERTEBRAL SEL VERTEBRAL UNI L MOD SED  06/04/2020   IR ANGIOGRAM FOLLOW UP STUDY  04/15/2019   IR RADIOLOGIST EVAL & MGMT  12/30/2016   IR TRANSCATH/EMBOLIZ  04/15/2019   IR US GUIDE VASC ACCESS RIGHT  03/19/2019   IR US GUIDE VASC ACCESS RIGHT  06/04/2020   MALONEY DILATION N/A 09/20/2018   Procedure: Venia Minks DILATION;  Surgeon: Daneil Dolin, MD;  [facility];  Service: Endoscopy;  Laterality: N/A;   RADIOLOGY WITH ANESTHESIA N/A 04/15/2019   Procedure: [employer];  Surgeon: Luanne Bras, MD;  Location: [place];  Service: Radiology;  Laterality: N/A;   RIGHT/LEFT HEART CATH AND CORONARY ANGIOGRAPHY N/A  08/19/2019   Procedure: RIGHT/LEFT HEART CATH AND CORONARY ANGIOGRAPHY;  Surgeon: Larey Dresser, MD;  Location: [place] [facility] LAB;  Service: Cardiovascular;  Laterality: N/A;   TUBAL LIGATION  1987   VAGINAL HYSTERECTOMY  2009     reports that she has been smoking cigarettes. She has a 1.50 pack-year smoking history. She has never used smokeless tobacco. She reports current alcohol use. She reports that she does not currently use drugs after having used the following drugs: Marijuana.  Allergies  Allergen Reactions   Tape Other (See Comments)    PEELS SKIN OFF  (PAPER TAPE IS FINE)    Family History  Problem Relation Age of Onset   Diabetes Father    Hypertension Father    CAD Father    Colon polyps Father 14       pre-cancerous    Stroke Father     Dementia Father    Stroke Mother    Hypertension Mother    Diabetes Mother    Heart failure Other    Breast cancer Maternal Grandmother    Colon cancer Neg Hx      Prior to Admission medications   Medication Sig Start Date End Date Taking? Authorizing Provider  acetaminophen (TYLENOL) 325 MG tablet Take 650 mg by mouth every 6 (six) hours as needed for mild pain or headache.    [provider]  ALPRAZolam Duanne Moron) 1 MG tablet Take 1-2 mg by mouth at bedtime.    [provider]  aspirin EC 81 MG tablet Take 81 mg by mouth every evening.     [provider]  atorvastatin (LIPITOR) 80 MG tablet Take 1 tablet (80 mg total) by mouth daily at 6 PM. 11/10/20   Larey Dresser, MD  carvedilol (COREG) 6.25 MG tablet Take 0.5 tablets (3.125 mg total) by mouth 2 (two) times daily. 11/13/20   Larey Dresser, MD  Cholecalciferol (VITAMIN D3) 10 MCG (400 UNIT) CAPS Take 1 capsule by mouth daily.    [provider]  clopidogrel (PLAVIX) 75 MG tablet Take 1 tablet (75 mg total) by mouth daily. 10/27/20   Louk, Bea Graff, PA-C  dapagliflozin propanediol (FARXIGA) 10 MG TABS tablet Take 1 tablet (10 mg total) by mouth daily before breakfast 11/10/20   Larey Dresser, MD  ezetimibe (ZETIA) 10 MG tablet Take 1 tablet (10 mg total) by mouth daily. 07/07/20   Larey Dresser, MD  furosemide (LASIX) 20 MG tablet Take 2 tablets (40 mg total) by mouth daily. 11/10/20 11/10/21  Larey Dresser, MD  gabapentin (NEURONTIN) 100 MG capsule Take 200 mg by mouth at bedtime.    [provider]  loperamide (IMODIUM) 2 MG capsule Take 2 mg by mouth 4 (four) times daily as needed for diarrhea or loose stools (ibs).     [provider]  LORazepam (ATIVAN) 1 MG tablet Take 0.5-1 tablets (0.5-1 mg total) by mouth daily. 09/15/20     Multiple Vitamins-Minerals (MULTIVITAMIN WITH MINERALS) tablet Take 1 tablet by mouth daily.    [provider]  nitroGLYCERIN (NITROSTAT) 0.4  MG SL tablet Place 1 tablet (0.4 mg total) under the tongue every 5 (five) minutes x 3 doses as needed for chest pain (if no relief after 2nd dose, proceed to the ED for an evaluation or call 911). 09/01/20   Verta Ellen., NP  omeprazole (PRILOSEC) 20 MG capsule Take 20 mg by mouth daily.    [provider]  ondansetron (ZOFRAN ODT) 4 MG disintegrating tablet Take 1 tablet (4 mg total) by mouth every 8 (eight) hours as needed for nausea or vomiting. 01/02/21   Myna Bright M, PA-C  rOPINIRole (REQUIP) 0.5 MG tablet Take 0.5 mg by mouth at bedtime.  11/13/17   [provider]  sacubitril-valsartan (ENTRESTO) 97-103 MG Take 1 tablet by mouth 2 (two) times daily. 08/17/20   Clegg, Amy D, NP  spironolactone (ALDACTONE) 25 MG tablet Take 1/2 (one-half) tablet by mouth once daily Patient taking differently: Take 12.5 mg by mouth daily. 12/10/20   Larey Dresser, MD  zinc gluconate 50 MG tablet Take 50 mg by mouth daily.    [provider]    Physical Exam: Vitals:   01/03/21 2215 01/03/21 2230 01/03/21 2245 01/03/21 2300  BP: 111/64 113/65 111/66 106/65  Pulse: 73 72 67 64  Resp: (!) 21 (!) '22 20 20  '$ Temp:      TempSrc:      SpO2: 99% 99% 99% 100%    Constitutional: Awake alert and oriented x3, no associated distress.   Skin: no rashes, no lesions, notable poor skin turgor. Eyes: Pupils are equally reactive to light.  No evidence of scleral icterus or conjunctival pallor.  ENMT: Moist mucous membranes noted.  Posterior pharynx clear of any exudate or lesions.   Neck: normal, supple, no masses, no thyromegaly.  No evidence of jugular venous distension.   Respiratory: clear to auscultation bilaterally, no wheezing, no crackles. Normal respiratory effort. No accessory muscle use.  Cardiovascular: Regular rate and rhythm, no murmurs / rubs / gallops. No extremity edema. 2+ pedal pulses. No carotid bruits.  Chest:   Nontender without crepitus or deformity.   Back:    Nontender without crepitus or deformity. Abdomen: Epigastric abdominal tenderness.  Abdomen is soft however.  No evidence of intra-abdominal masses.  Positive bowel sounds noted in all quadrants.   Musculoskeletal: No joint deformity upper and lower extremities. Good ROM, no contractures. Normal muscle tone.  Neurologic: CN 2-12 grossly intact. Sensation intact.  Patient moving all 4 extremities spontaneously.  Patient is following all commands.  Patient is responsive to verbal stimuli.   Psychiatric: Patient exhibits normal mood with appropriate affect.  Patient seems to possess insight as to their current situation.     Labs on Admission: I have personally reviewed following labs and imaging studies -   CBC: Recent Labs  Lab 01/02/21 0855 01/03/21 1810  WBC 8.1 8.5  NEUTROABS 4.9 4.7  HGB 13.6 13.0  HCT 40.8 39.0  MCV 87.9 87.6  PLT 395 [id number]   Basic Metabolic Panel: Recent Labs  Lab 01/02/21 0855 01/03/21 1810 01/03/21 2130  NA 136 137  --   K 3.7 3.1*  --   CL 102 102  --   CO2 26 25  --   GLUCOSE 101* 108*  --   BUN 17 18  --   CREATININE 1.00 1.72*  --   CALCIUM 9.9 9.5  --   MG  --   --  2.1   GFR: Estimated Creatinine Clearance: 25.6 mL/min (A) (by C-G formula based on SCr of 1.72 mg/dL (H)). Liver Function Tests: Recent Labs  Lab 01/02/21 0855 01/03/21 1810  AST 24 19  ALT 18 15  ALKPHOS 76 65  BILITOT 1.4* 0.7  PROT 7.6 6.6  ALBUMIN 4.3 3.6   Recent Labs  Lab 01/02/21 0855 01/03/21 1810  LIPASE 53* 69*   No results for input(s): AMMONIA  in the last 168 hours. Coagulation Profile: No results for input(s): INR, PROTIME in the last 168 hours. Cardiac Enzymes: No results for input(s): CKTOTAL, CKMB, CKMBINDEX, TROPONINI in the last 168 hours. BNP (last 3 results) No results for input(s): PROBNP in the last 8760 hours. HbA1C: No results for input(s): HGBA1C in the last 72 hours. CBG: No results for input(s): GLUCAP in the last 168 hours. Lipid  Profile: No results for input(s): CHOL, HDL, LDLCALC, TRIG, CHOLHDL, LDLDIRECT in the last 72 hours. Thyroid Function Tests: No results for input(s): TSH, T4TOTAL, FREET4, T3FREE, THYROIDAB in the last 72 hours. Anemia Panel: No results for input(s): VITAMINB12, FOLATE, FERRITIN, TIBC, IRON, RETICCTPCT in the last 72 hours. Urine analysis:    Component Value Date/Time   COLORURINE YELLOW 01/03/2021 2100   APPEARANCEUR CLEAR 01/03/2021 2100   LABSPEC 1.011 01/03/2021 2100   PHURINE 5.0 01/03/2021 2100   GLUCOSEU >=500 (A) 01/03/2021 2100   HGBUR NEGATIVE 01/03/2021 2100   BILIRUBINUR NEGATIVE 01/03/2021 2100   [place] NEGATIVE 01/03/2021 2100   PROTEINUR NEGATIVE 01/03/2021 2100   NITRITE NEGATIVE 01/03/2021 2100   LEUKOCYTESUR NEGATIVE 01/03/2021 2100    Radiological Exams on Admission - Personally Reviewed: DG Chest 2 View  Result Date: 01/03/2021 CLINICAL DATA:  Chest pressure. EXAM: CHEST - 2 VIEW COMPARISON:  December 13, 2020 FINDINGS: The single lead AICD device is in good position. The heart, hila, mediastinum, lungs, and pleura are otherwise unremarkable. No acute abnormalities. IMPRESSION: No active cardiopulmonary disease. Electronically Signed   By: Dorise Bullion III M.D.   On: 01/03/2021 19:48   DG Abdomen Acute W/Chest  Result Date: 01/02/2021 CLINICAL DATA:  Abdominal pain, diarrhea. EXAM: DG ABDOMEN ACUTE WITH 1 VIEW CHEST COMPARISON:  October 23, 2018. FINDINGS: There is no evidence of dilated bowel loops or free intraperitoneal air. No radiopaque calculi or other significant radiographic abnormality is seen. Heart size and mediastinal contours are within normal limits. Both lungs are clear. IMPRESSION: Negative abdominal radiographs.  No acute cardiopulmonary disease. Electronically Signed   By: Marijo Conception M.D.   On: 01/02/2021 09:27    EKG: Personally reviewed.  Rhythm is normal sinus rhythm with heart rate of 93 bpm with evidence of PVCs.  No dynamic ST segment  changes appreciated.  Assessment/Plan Principal Problem:   Intractable nausea and vomiting  Patient presenting with 4-day history of ongoing nausea and vomiting with limited ability to tolerate any oral intake Symptoms have been so severe the patient is now becoming hypotensive with a systolic blood pressures in the 70s and developing mild acute kidney injury Patient has been suffering from these symptoms intermittently for 2 months now resulting in a 10 pound weight loss and poor appetite Of note, patient underwent EGD and 2020 by [facility] gastroenterology with Dr. Gala Romney that was essentially unremarkable Lipase is minimally elevated at 69.  LFTs are unremarkable.  Urinalysis is additionally unremarkable. X-ray performed 8/27 revealed no obstruction.  We will proceed with obtaining CT imaging at this point. Patient may benefit from upper endoscopy to rule out gastric outlet obstruction, will place secure chat consult requested to Dr. Benson Norway who is on-call for [facility] tonight to request this evaluation the morning and consideration of endoscopic evaluation. This patient's work-up ends up being negative, considering patient's multiple tick bites in mid June an alpha gal allergy is possible.  Patient would need to avoid mammalian meats going forward and follow-up as an outpatient with an allergist if this was the case. As  needed antiemetics and gentle intravenous hydration in the meantime considering hypotension and acute kidney injury Intravenous PPI  Active Problems:   Chest pain  Episodic chest pain is seemingly atypical and is occurring after episodes of nausea and vomiting. Possibly esophageal spasm Of note, 2 sets of troponins are unremarkable. Monitoring patient on telemetry Patient last had a cardiac catheterization in April 2021 which revealed a patent stents and minimal residual coronary disease Trial of GI cocktail provided in the emergency department    AKI (acute kidney  injury) ([place])  Mild acute kidney injury with creatinine being 1.0 at [name] yesterday and now 1.72 This is likely prerenal secondary to volume depletion due to ongoing nausea vomiting and poor oral intake Hydrating patient gently with intravenous isotonic fluids Strict input and output monitoring Monitoring renal function and electrolytes with serial chemistries Avoiding nephrotoxic agents if at all possible    Mixed hyperlipidemia  Continue home regimen of atorvastatin and Zetia    GERD without esophagitis  On intravenous Protonix as mentioned above    Coronary artery disease involving native coronary artery of native heart  Discussion about intermittent atypical chest pain as mentioned above Continue aspirin, statin therapy and beta-blocker as blood pressure tolerates    Hypokalemia due to excessive gastrointestinal loss of potassium  Replacing with oral and intravenous potassium chloride    Chronic combined systolic and diastolic congestive heart failure (HCC)  No evidence of cardiogenic volume overload at this time   Code Status:  Full code Family Communication: deferred   Status is: Observation  The patient remains OBS appropriate and will d/c before 2 midnights.  Dispo: The patient is from: Home              Anticipated d/c is to: Home              Patient currently is not medically stable to d/c.   Difficult to place patient No        Vernelle Emerald MD [facility] Pager [phone]  If 7PM-7AM, please contact night-coverage [email or web address] Use universal [place] password for that web site. If you do not have the password, please call the hospital operator.  01/04/2021, 1:54 AM

## 2021-01-04 NOTE — ED Notes (Signed)
Admitting provider at bedside.

## 2021-01-05 ENCOUNTER — Inpatient Hospital Stay (HOSPITAL_COMMUNITY): Payer: Commercial Managed Care - PPO | Admitting: Certified Registered Nurse Anesthetist

## 2021-01-05 ENCOUNTER — Encounter (HOSPITAL_COMMUNITY): Payer: Self-pay | Admitting: Internal Medicine

## 2021-01-05 ENCOUNTER — Encounter (HOSPITAL_COMMUNITY)
Admission: EM | Disposition: A | Discharge status: Home / Self Care | Payer: Self-pay | Source: Home / Self Care | Attending: Internal Medicine

## 2021-01-05 ENCOUNTER — Other Ambulatory Visit: Payer: Self-pay

## 2021-01-05 DIAGNOSIS — K2970 Gastritis, unspecified, without bleeding: Secondary | ICD-10-CM

## 2021-01-05 HISTORY — PX: ESOPHAGOGASTRODUODENOSCOPY (EGD) WITH PROPOFOL: SHX5813

## 2021-01-05 HISTORY — PX: BIOPSY: SHX5522

## 2021-01-05 SURGERY — ESOPHAGOGASTRODUODENOSCOPY (EGD) WITH PROPOFOL
Anesthesia: Monitor Anesthesia Care

## 2021-01-05 MED ORDER — LACTATED RINGERS IV SOLN
INTRAVENOUS | Status: DC
  Administered 2021-01-05: 1000 mL via INTRAVENOUS

## 2021-01-05 MED ORDER — PROPOFOL 500 MG/50ML IV EMUL
INTRAVENOUS | Status: DC | PRN
  Administered 2021-01-05: 100 ug/kg/min via INTRAVENOUS

## 2021-01-05 MED ORDER — CLOPIDOGREL BISULFATE 75 MG PO TABS
75.0000 mg | ORAL_TABLET | Freq: Every day | ORAL | Status: DC
  Administered 2021-01-05 – 2021-01-06 (×2): 75 mg via ORAL
  Filled 2021-01-05 (×2): qty 1

## 2021-01-05 MED ORDER — LACTATED RINGERS IV SOLN: INTRAVENOUS | Status: DC | PRN

## 2021-01-05 MED ORDER — PROPOFOL 10 MG/ML IV BOLUS
INTRAVENOUS | Status: DC | PRN
  Administered 2021-01-05 (×2): 10 mg via INTRAVENOUS

## 2021-01-05 MED ORDER — ONDANSETRON HCL 4 MG/2ML IJ SOLN
4.0000 mg | Freq: Once | INTRAMUSCULAR | Status: AC | PRN
  Administered 2021-01-05: 4 mg via INTRAVENOUS
  Filled 2021-01-05: qty 2

## 2021-01-05 SURGICAL SUPPLY — 15 items

## 2021-01-05 NOTE — Anesthesia Postprocedure Evaluation (Signed)
Anesthesia Post Note  Patient: Erin [name]  Procedure(s) Performed: ESOPHAGOGASTRODUODENOSCOPY (EGD) WITH PROPOFOL BIOPSY     Patient location during evaluation: Endoscopy Anesthesia Type: MAC Level of consciousness: awake and alert, patient cooperative and oriented Pain management: pain level controlled Vital Signs Assessment: post-procedure vital signs reviewed and stable Respiratory status: spontaneous breathing, nonlabored ventilation and respiratory function stable Cardiovascular status: blood pressure returned to baseline Postop Assessment: no apparent nausea or vomiting Anesthetic complications: no   No notable events documented.  Last Vitals:  Vitals:   01/05/21 0900 01/05/21 0942  BP: 105/65 109/79  Pulse: 62 (!) 54  Resp: 20 18  Temp:  36.8 C  SpO2: 99% 99%    Last Pain:  Vitals:   01/05/21 0900  TempSrc:   PainSc: 0-No pain                 Shamone Winzer,E. Carlisha Wisler

## 2021-01-05 NOTE — Interval H&P Note (Signed)
History and Physical Interval Note:  01/05/2021 8:08 AM  Erin [name]  has presented today for surgery, with the diagnosis of nausea, vomiting, weight loss.  The various methods of treatment have been discussed with the patient and family. After consideration of risks, benefits and other options for treatment, the patient has consented to  Procedure(s): ESOPHAGOGASTRODUODENOSCOPY (EGD) WITH PROPOFOL (N/A) as a surgical intervention.  The patient's history has been reviewed, patient examined, no change in status, stable for surgery.  I have reviewed the patient's chart and labs.  Questions were answered to the patient's satisfaction.     Milus Banister

## 2021-01-05 NOTE — Anesthesia Preprocedure Evaluation (Signed)
Anesthesia Evaluation  Patient identified by MRN, date of birth, ID band Patient awake    Reviewed: Allergy & Precautions, NPO status , Patient's Chart, lab work & pertinent test results, reviewed documented beta blocker date and time   History of Anesthesia Complications Negative for: history of anesthetic complications  Airway Mallampati: II  TM Distance: >3 FB Neck ROM: Full    Dental  (+) Upper Dentures, Poor Dentition, Edentulous Upper, Missing, Chipped, Dental Advisory Given   Pulmonary Current Smoker and Patient abstained from smoking.,  01/02/2021 SARS coronavirus NEG   breath sounds clear to auscultation       Cardiovascular hypertension, Pt. on medications and Pt. on home beta blockers (-) angina+ CAD, + Past MI, + Cardiac Stents and +CHF  + Cardiac Defibrillator  Rhythm:Regular Rate:Normal  03/2020 ECHO: EF 30 to 35%. The LV has moderately decreased function, internal cavity size was moderately to severely dilated, mild asymmetric LVH, Grade II DD, no significant valvular abnormalities   Neuro/Psych negative neurological ROS     GI/Hepatic Neg liver ROS, GERD  Medicated,  Endo/Other  negative endocrine ROS  Renal/GU Renal InsufficiencyRenal disease     Musculoskeletal   Abdominal   Peds  Hematology negative hematology ROS (+)   Anesthesia Other Findings   Reproductive/Obstetrics                             Anesthesia Physical Anesthesia Plan  ASA: 4  Anesthesia Plan: MAC   Post-op Pain Management:    Induction:   PONV Risk Score and Plan: 1 and Ondansetron  Airway Management Planned: Natural Airway and Nasal Cannula  Additional Equipment: None  Intra-op Plan:   Post-operative Plan:   Informed Consent: I have reviewed the patients History and Physical, chart, labs and discussed the procedure including the risks, benefits and alternatives for the proposed anesthesia  with the patient or authorized representative who has indicated his/her understanding and acceptance.     Dental advisory given  Plan Discussed with: CRNA and Surgeon  Anesthesia Plan Comments:         Anesthesia Quick Evaluation

## 2021-01-05 NOTE — Transfer of Care (Signed)
Immediate Anesthesia Transfer of Care Note  Patient: Erin [name]  Procedure(s) Performed: ESOPHAGOGASTRODUODENOSCOPY (EGD) WITH PROPOFOL BIOPSY  Patient Location: Endoscopy Unit  Anesthesia Type:MAC  Level of Consciousness: awake, alert  and oriented  Airway & Oxygen Therapy: Patient Spontanous Breathing and Patient connected to nasal cannula oxygen  Post-op Assessment: Report given to RN, Post -op Vital signs reviewed and stable and Patient moving all extremities X 4  Post vital signs: Reviewed and stable  Last Vitals:  Vitals Value Taken Time  BP 103/40 01/05/21 0840  Temp    Pulse 63 01/05/21 0841  Resp 20 01/05/21 0841  SpO2 100 % 01/05/21 0841  Vitals shown include unvalidated device data.  Last Pain:  Vitals:   01/05/21 0734  TempSrc: Tympanic  PainSc: 0-No pain         Complications: No notable events documented.

## 2021-01-05 NOTE — Anesthesia Procedure Notes (Signed)
Procedure Name: MAC Date/Time: 01/05/2021 8:25 AM Performed by: Harden Mo, CRNA Pre-anesthesia Checklist: Patient identified, Emergency Drugs available, Suction available and Patient being monitored Patient Re-evaluated:Patient Re-evaluated prior to induction Oxygen Delivery Method: Nasal cannula Preoxygenation: Pre-oxygenation with 100% oxygen Induction Type: IV induction Placement Confirmation: positive ETCO2 and breath sounds checked- equal and bilateral Dental Injury: Teeth and Oropharynx as per pre-operative assessment

## 2021-01-05 NOTE — Op Note (Addendum)
[facility] Patient Name: [name] Procedure Date : 01/05/2021 MRN: [id number] Attending MD: Milus Banister , MD Date of Birth: 13-Mar-1961 CSN: [id number] Age: 60 Admit Type: Inpatient Procedure:                Upper GI endoscopy Indications:              Nausea with vomiting, may be improving since                            yesterday. Providers:                Milus Banister, MD, Doristine Johns, RN, Benetta Spar, Technician Referring MD:              Medicines:                Monitored Anesthesia Care Complications:            No immediate complications. Estimated blood loss:                            None. Estimated Blood Loss:     Estimated blood loss: none. Procedure:                Pre-Anesthesia Assessment:                           - Prior to the procedure, a History and Physical                            was performed, and patient medications and                            allergies were reviewed. The patient's tolerance of                            previous anesthesia was also reviewed. The risks                            and benefits of the procedure and the sedation                            options and risks were discussed with the patient.                            All questions were answered, and informed consent                            was obtained. Prior Anticoagulants: The patient has                            taken Plavix (clopidogrel), last dose was 4 days                            prior to  procedure. ASA Grade Assessment: III - A                            patient with severe systemic disease. After                            reviewing the risks and benefits, the patient was                            deemed in satisfactory condition to undergo the                            procedure.                           After obtaining informed consent, the endoscope was                            passed  under direct vision. Throughout the                            procedure, the patient's blood pressure, pulse, and                            oxygen saturations were monitored continuously. The                            [id number]) Olympus endoscope was introduced                            through the mouth, and advanced to the second part                            of duodenum. The upper GI endoscopy was                            accomplished without difficulty. The patient                            tolerated the procedure well. Scope In: Scope Out: Findings:      Mild inflammation characterized by erosions, erythema and granularity       was found in the gastric antrum. Biopsies were taken with a cold forceps       for histology.      The exam was otherwise without abnormality. Impression:               - Mild, non-specific distal gastritis. Biopsies                            taken to check for H. pylori.                           - The examination was otherwise normal. Recommendation:           - Await pathology results. If biopsies are positive  for H. pylori then will start appropriate                            antibiotics.                           - Heart healthy diet, empiric antinausea                            treatments, await Alpha Gal bloodwork that was                            drawn today given recent tic bites and the fact                            that her vomiting is somtimes after red meat.                           - Eventual outpatient follow up with her primary GI                            Dr. Gala Romney.                           - It is safe for her to resume her plavix today. Procedure Code(s):        --- Professional ---                           [id number], Esophagogastroduodenoscopy, flexible,                            transoral; with biopsy, single or multiple Diagnosis Code(s):        --- Professional ---                            K29.70, Gastritis, unspecified, without bleeding                           R11.2, Nausea with vomiting, unspecified CPT copyright 2019 American Medical Association. All rights reserved. The codes documented in this report are preliminary and upon coder review may  be revised to meet current compliance requirements. Milus Banister, MD 01/05/2021 8:41:32 AM This report has been signed electronically. Number of Addenda: 0

## 2021-01-05 NOTE — Progress Notes (Signed)
PROGRESS NOTE    Erin [name]  [id number] DOB: 10-Sep-1960 DOA: 01/03/2021 PCP: [name], MD   Brief Narrative: 60 year old with past medical history significant for ischemic cardiomyopathy with systolic and diastolic heart failure (ejection fraction 30 to 35% echo 2021, CAD s/p DES to proximal LC and PDA, GERD, generalized anxiety disorder, hyperlipidemia who presents with 4 days history of nausea and vomiting.  She reports a history of intermittent episode of nausea and vomiting since mid June.  She received 3 tick bites in section in mid June.  She thinks that may she have an alpha gal allergy.  She presented to the ED because she was having low blood pressure and was symptomatic.  Evaluation in the ED systolic blood pressure was in the 70s.  She received 500 cc IV bolus. Admitted with intractable nausea and vomiting and hypotension. BP improved now 100 range. Patient underwent endoscopy which showed mild, nonspecific distal gastritis.  Awaiting biopsy for H. pylori.    Assessment & Plan:   Principal Problem:   Intractable nausea and vomiting Active Problems:   Essential hypertension   Mixed hyperlipidemia   GERD without esophagitis   Chest pain   Coronary artery disease involving native coronary artery of native heart   Hypokalemia due to excessive gastrointestinal loss of potassium   Chronic combined systolic and diastolic congestive heart failure (HCC)   AKI (acute kidney injury) (HCC)   1-Intractable nausea and vomiting: -She has had intermittent episodes since 2 months ago, reports 10 pounds weight loss -Continue with supportive care, as needed antiemetic -GI has been consulted.  Underwent endoscopy which showed mild nonspecific distal gastritis.  Biopsy for H. pylori pending. -Elevation of lipase. -IV PPI -CT Abdomen and pelvis: No acute abnormalities. -Patient is started on diet today, plan to monitor overnight make sure patient is able to tolerate diet. -Alpha  galactosidase pending. To evaluate for alpha gal allergy in the setting of history of tick bite.    2-Chest pain: Atypical: Possible esophageal spasm  versus related to vomiting Troponin x 2 negative.  3-AKI: Presents with Cr  of 1.7 Likely prerenal in the setting of hypovolemia, hypotension and vomiting Hold diuretics..  Resume furosemide tomorrow or at discharge  Hypotension: In the setting of hypovolemia and Entresto Holding parameters for Coreg Plan to hold Entresto.  Will need to continue to hold Entresto at discharge. Received  IV bolus.  Improved.  Chronic combined systolic and diastolic heart failure: Currently compensated Monitor on IV fluid. Compensated Hold diuretics (Entresto) Might be able to resume furosemide at discharge.   HLD: Continue with atorvastatin and Zetia  GERD: Continue with PPI  History of CAD: Continue with aspirin, statin  Holding Plavix for possible endoscopy  Hypokalemia: Replete IV    Estimated body mass index is 22.26 kg/m as calculated from the following:   Height as of this encounter: 5' (1.524 m).   Weight as of this encounter: 51.7 kg.   DVT prophylaxis: SCDs Code Status: Full code Family Communication: Discussed with patient Disposition Plan:  Status is: Observation  The patient will require care spanning > 2 midnights and should be moved to inpatient because: IV treatments appropriate due to intensity of illness or inability to take PO  Dispo: The patient is from: Home              Anticipated d/c is to: Home              Patient currently is not medically stable to  d/c.   Difficult to place patient No        Consultants:  GI  Procedures:  None   Antimicrobials:    Subjective: She had endoscopy today.  She is eating lunch.  She was only able to eat a small amount.  Objective: Vitals:   01/05/21 0842 01/05/21 0852 01/05/21 0900 01/05/21 0942  BP: (!) 103/40 105/68 105/65 109/79  Pulse: 65 66 62 (!) 54   Resp: 17 (!) 32 20 18  Temp: 98 F (36.7 C)   98.2 F (36.8 C)  TempSrc: Temporal     SpO2: 100% 100% 99% 99%  Weight:      Height:        Intake/Output Summary (Last 24 hours) at 01/05/2021 1509 Last data filed at 01/05/2021 0900 Gross per 24 hour  Intake 390 ml  Output 0 ml  Net 390 ml   Filed Weights   01/04/21 2027  Weight: 51.7 kg    Examination:  General exam: NAD Respiratory system: =CTA Cardiovascular system: S 1, S 2 RRR Gastrointestinal system: BS present, soft, nt Central nervous system: Alert Extremities: No edema    Data Reviewed: I have personally reviewed following labs and imaging studies  CBC: Recent Labs  Lab 01/02/21 0855 01/03/21 1810 01/04/21 0131  WBC 8.1 8.5 8.0  NEUTROABS 4.9 4.7 4.1  HGB 13.6 13.0 11.7*  HCT 40.8 39.0 36.6  MCV 87.9 87.6 88.6  PLT 395 396 [id number]    Basic Metabolic Panel: Recent Labs  Lab 01/02/21 0855 01/03/21 1810 01/03/21 2130 01/04/21 0131  NA 136 137  --  140  K 3.7 3.1*  --  4.1  CL 102 102  --  107  CO2 26 25  --  24  GLUCOSE 101* 108*  --  93  BUN 17 18  --  16  CREATININE 1.00 1.72*  --  1.37*  CALCIUM 9.9 9.5  --  9.1  MG  --   --  2.1 2.3    GFR: Estimated Creatinine Clearance: 32.2 mL/min (A) (by C-G formula based on SCr of 1.37 mg/dL (H)). Liver Function Tests: Recent Labs  Lab 01/02/21 0855 01/03/21 1810 01/04/21 0131  AST '24 19 17  '$ ALT '18 15 14  '$ ALKPHOS 76 65 56  BILITOT 1.4* 0.7 0.8  PROT 7.6 6.6 6.1*  ALBUMIN 4.3 3.6 3.2*    Recent Labs  Lab 01/02/21 0855 01/03/21 1810  LIPASE 53* 69*    No results for input(s): AMMONIA in the last 168 hours. Coagulation Profile: No results for input(s): INR, PROTIME in the last 168 hours. Cardiac Enzymes: No results for input(s): CKTOTAL, CKMB, CKMBINDEX, TROPONINI in the last 168 hours. BNP (last 3 results) No results for input(s): PROBNP in the last 8760 hours. HbA1C: No results for input(s): HGBA1C in the last 72  hours. CBG: No results for input(s): GLUCAP in the last 168 hours. Lipid Profile: No results for input(s): CHOL, HDL, LDLCALC, TRIG, CHOLHDL, LDLDIRECT in the last 72 hours. Thyroid Function Tests: No results for input(s): TSH, T4TOTAL, FREET4, T3FREE, THYROIDAB in the last 72 hours. Anemia Panel: No results for input(s): VITAMINB12, FOLATE, FERRITIN, TIBC, IRON, RETICCTPCT in the last 72 hours. Sepsis Labs: No results for input(s): PROCALCITON, LATICACIDVEN in the last 168 hours.  Recent Results (from the past 240 hour(s))  SARS CORONAVIRUS 2 (TAT 6-24 HRS) Nasopharyngeal Nasopharyngeal Swab     Status: None   Collection Time: 01/02/21  9:01 AM   Specimen: Nasopharyngeal Swab  Result Value Ref Range Status   SARS Coronavirus 2 NEGATIVE NEGATIVE Final    Comment: (NOTE) SARS-CoV-2 target nucleic acids are NOT DETECTED.  The SARS-CoV-2 RNA is generally detectable in upper and lower respiratory specimens during the acute phase of infection. Negative results do not preclude SARS-CoV-2 infection, do not rule out co-infections with other pathogens, and should not be used as the sole basis for treatment or other patient management decisions. Negative results must be combined with clinical observations, patient history, and epidemiological information. The expected result is Negative.  Fact Sheet for Patients: SugarRoll.be  Fact Sheet for Healthcare Providers: https://www.woods-mathews.com/  This test is not yet approved or cleared by the [place] FDA and  has been authorized for detection and/or diagnosis of SARS-CoV-2 by FDA under an Emergency Use Authorization (EUA). This EUA will remain  in effect (meaning this test can be used) for the duration of the COVID-19 declaration under Se ction 564(b)(1) of the Act, 21 U.S.C. section 360bbb-3(b)(1), unless the authorization is terminated or revoked sooner.  Performed at [facility], [place] [address] [place], [place] [address]   Resp Panel by RT-PCR (Flu A&B, Covid)     Status: None   Collection Time: 01/03/21  9:00 PM   Specimen: Nasopharyngeal(NP) swabs in vial transport medium  Result Value Ref Range Status   SARS Coronavirus 2 by RT PCR NEGATIVE NEGATIVE Final    Comment: (NOTE) SARS-CoV-2 target nucleic acids are NOT DETECTED.  The SARS-CoV-2 RNA is generally detectable in upper respiratory specimens during the acute phase of infection. The lowest concentration of SARS-CoV-2 viral copies this assay can detect is 138 copies/mL. A negative result does not preclude SARS-Cov-2 infection and should not be used as the sole basis for treatment or other patient management decisions. A negative result may occur with  improper specimen collection/handling, submission of specimen other than nasopharyngeal swab, presence of viral mutation(s) within the areas targeted by this assay, and inadequate number of viral copies(<138 copies/mL). A negative result must be combined with clinical observations, patient history, and epidemiological information. The expected result is Negative.  Fact Sheet for Patients:  EntrepreneurPulse.com.au  Fact Sheet for Healthcare Providers:  IncredibleEmployment.be  This test is no t yet approved or cleared by the [place] FDA and  has been authorized for detection and/or diagnosis of SARS-CoV-2 by FDA under an Emergency Use Authorization (EUA). This EUA will remain  in effect (meaning this test can be used) for the duration of the COVID-19 declaration under Section 564(b)(1) of the Act, 21 U.S.C.section 360bbb-3(b)(1), unless the authorization is terminated  or revoked sooner.       Influenza A by PCR NEGATIVE NEGATIVE Final   Influenza B by PCR NEGATIVE NEGATIVE Final    Comment: (NOTE) The Xpert Xpress SARS-CoV-2/FLU/RSV plus assay is intended as an aid in the diagnosis of influenza  from Nasopharyngeal swab specimens and should not be used as a sole basis for treatment. Nasal washings and aspirates are unacceptable for Xpert Xpress SARS-CoV-2/FLU/RSV testing.  Fact Sheet for Patients: EntrepreneurPulse.com.au  Fact Sheet for Healthcare Providers: IncredibleEmployment.be  This test is not yet approved or cleared by the [place] FDA and has been authorized for detection and/or diagnosis of SARS-CoV-2 by FDA under an Emergency Use Authorization (EUA). This EUA will remain in effect (meaning this test can be used) for the duration of the COVID-19 declaration under Section 564(b)(1) of the Act, 21 U.S.C. section 360bbb-3(b)(1), unless the authorization is terminated or  revoked.  Performed at [facility], [place] [address] [place], [place] [address]           Radiology Studies: CT ABDOMEN PELVIS WO CONTRAST  Result Date: 01/04/2021 CLINICAL DATA:  Left abdominal pain, nausea/vomiting EXAM: CT ABDOMEN AND PELVIS WITHOUT CONTRAST TECHNIQUE: Multidetector CT imaging of the abdomen and pelvis was performed following the standard protocol without IV contrast. COMPARISON:  02/17/2019 FINDINGS: Lower chest: Lung bases are clear. Hepatobiliary: Unenhanced liver is unremarkable. Status post cholecystectomy. No intrahepatic or extrahepatic ductal dilatation. Pancreas: Within normal limits. Spleen: Within normal limits. Adrenals/Urinary Tract: 2.5 cm left adrenal adenoma (series 3/image 16). 10 mm right adrenal adenoma (series 3/image 15). Kidneys are within normal limits. No renal calculi or hydronephrosis. Bladder is within normal limits. Stomach/Bowel: Stomach is within normal limits. No evidence of bowel obstruction. Appendix is not discretely visualized, reportedly surgically absent. No colonic wall thickening or inflammatory changes. Vascular/Lymphatic: No evidence of abdominal aortic aneurysm. Atherosclerotic calcifications of  the abdominal aorta and branch vessels. No suspicious abdominopelvic lymphadenopathy. Reproductive: Status post hysterectomy. Right ovary is within normal limits.  No left adnexal mass. Other: No abdominopelvic ascites. Musculoskeletal: Visualized osseous structures are within normal limits. IMPRESSION: No CT findings to account for the patient's left abdominal pain. Bilateral adrenal adenomas, benign. Electronically Signed   By: Julian Hy M.D.   On: 01/04/2021 02:34   DG Chest 2 View  Result Date: 01/03/2021 CLINICAL DATA:  Chest pressure. EXAM: CHEST - 2 VIEW COMPARISON:  December 13, 2020 FINDINGS: The single lead AICD device is in good position. The heart, hila, mediastinum, lungs, and pleura are otherwise unremarkable. No acute abnormalities. IMPRESSION: No active cardiopulmonary disease. Electronically Signed   By: Dorise Bullion III M.D.   On: 01/03/2021 19:48        Scheduled Meds:  ALPRAZolam  1-2 mg Oral QHS   aspirin EC  81 mg Oral QPM   atorvastatin  80 mg Oral q1800   carvedilol  3.125 mg Oral BID WC   clopidogrel  75 mg Oral Daily   ezetimibe  10 mg Oral Daily   gabapentin  200 mg Oral QHS   LORazepam  0.5-1 mg Oral Daily   pantoprazole (PROTONIX) IV  40 mg Intravenous Q12H   rOPINIRole  0.5 mg Oral QHS   Continuous Infusions:  sodium chloride       LOS: 1 day    Time spent: 35 minutes    Delfino Friesen A Mekhi Lascola, MD [facility]   If 7PM-7AM, please contact night-coverage [email or web address]  01/05/2021, 3:09 PM

## 2021-01-06 LAB — BASIC METABOLIC PANEL
Anion gap: 6 (ref 5–15)
BUN: 16 mg/dL (ref 6–20)
CO2: 25 mmol/L (ref 22–32)
Calcium: 9.5 mg/dL (ref 8.9–10.3)
Chloride: 106 mmol/L (ref 98–111)
Creatinine, Ser: 1.1 mg/dL — ABNORMAL HIGH (ref 0.44–1.00)
GFR, Estimated: 58 mL/min — ABNORMAL LOW (ref >60)
Glucose, Bld: 91 mg/dL (ref 70–99)
Potassium: 4.1 mmol/L (ref 3.5–5.1)
Sodium: 137 mmol/L (ref 135–145)

## 2021-01-06 MED ORDER — OMEPRAZOLE 20 MG PO CPDR: 20.0000 mg | DELAYED_RELEASE_CAPSULE | Freq: Every day | ORAL | 0 refills | Status: DC

## 2021-01-06 MED ORDER — ONDANSETRON 4 MG PO TBDP: 4.0000 mg | ORAL_TABLET | Freq: Three times a day (TID) | ORAL | 0 refills | Status: AC | PRN

## 2021-01-06 NOTE — Discharge Summary (Signed)
Physician Discharge Summary  Erin [name] [id number] DOB: May 29, 1960 DOA: 01/03/2021  PCP: [name], MD  Admit date: 01/03/2021 Discharge date: 01/06/2021  Admitted From: Home Discharge disposition: Home   Code Status: Full Code   Discharge Diagnosis:   Principal Problem:   Intractable nausea and vomiting Active Problems:   Essential hypertension   Mixed hyperlipidemia   GERD without esophagitis   Chest pain   Coronary artery disease involving native coronary artery of native heart   Hypokalemia due to excessive gastrointestinal loss of potassium   Chronic combined systolic and diastolic congestive heart failure ([place])   AKI (acute kidney injury) ([place])     Chief complaint: Persistent nausea and vomiting.  Brief narrative: Erin [name] is a 60 y.o. female with PMH significant for ischemic cardiomyopathy with systolic and diastolic congestive heart failure (Echo 03/2020 EF 30-35% with G2DD), coronary artery disease (S/P DES to proximal Lcx and PDA, last cath 08/2019 revealing patent stents and minimal disease), lest posterior communicating artery brain aneurysm (S/P embolization 04/2019),  gastroesophageal reflux disease, nicotine dependence, generalized anxiety disorder, hyperlipidemia. Patient presented to the ED on 8/28 with a 4-day history of nausea and vomiting. For the last 2 months, patient has had intermittent episodes of nausea vomiting lasting from 2 to 3 days.  Vomitus typically nonbilious nonbloody.  She noticed that she was beginning to experience these episodes shortly after she received 3 tick bites in succession in mid June.  She and her husband have both come to the conclusion that she may have an alpha gal allergy and as a result she has attempted to avoid beef and pork since. She has lost about 10 pounds weight in 2 months, reports waxing and waning generalized abdominal discomfort without melena, hematochezia or hematemesis. 8/27, she presented to ED at  [facility] for intense nausea and vomiting after eating outside.  She was hydrated and discharged home.  Next morning on 8/28, patient had lightheadedness, she noted that her blood pressure was low in 70s and 80s and hence came to was called ED for evaluation.  In the ED, her blood pressure was low at 71/45.  She was given 500 mL of normal saline bolus. Labs showed creatinine up to 1.72, potassium low Admitted to hospitalist service for further evaluation management.  Subjective: Patient was seen and examined this morning.  Pleasant middle-aged Caucasian female.  Lying down in bed.  Not in distress.  Nausea vomiting improved.  Able to tolerate diet now.  Wants to go home. Chart reviewed.  Afebrile, blood pressure running low normal range, breathing on room air.   Hospital course Intractable nausea and vomiting History of GERD -Ongoing intermittently for last 2 months.  Presented with an worsening episode. GI consult appreciated.  8/30, patient underwent EGD which showed mild nonspecific distal gastritis.  Gastric biopsies taken.  Pending report. -Currently on antinausea medication.  Also gal allergy test was sent.  History of recent tick bites and vomiting related to red meat. -Continue Protonix daily.  Continue as needed Zofran 4 mg every 8 hours as needed. -GI Dr. Ardis Hughs office to call her with the report of gastric biopsy and alpha gal blood work. -Continue eventual work-up by patient's primary GI attending Dr. Gala Romney at [facility] gastroenterology.  Hypotension  Chronic combined systolic and diastolic CHF -Blood pressure was significantly low at 70s on admission.   -Heart failure is compensated at this time. -Home meds include Coreg 3.125 mg twice daily, Farxiga 10 mg daily, Lasix  40 mg daily, Aldactone 12.5 mg daily, Entresto 97/103 mg twice daily. -Currently continued on Coreg only.  Others remain on hold.  Blood pressure in low normal range.  At discharge, I would continue Coreg only.   I recommended patient to monitor her blood pressure at home.  If blood pressure exceeds 140/90, she can gradually resume other medicine, I think, in the order of Entresto, Aldactone and Lasix as needed.   -Follow-up with heart failure team as an outpatient.  Chest pain History of CAD / HLD -Chest pain on this admission was probably due to persistent nausea and vomiting. Troponin negative x2. -Continue aspirin, Plavix statin and beta-blocker.  Continue Zetia.  AKI -Likely prerenal due to persistent nausea and vomiting. -Improved with hydration. Recent Labs    10/19/20 1752 10/20/20 0508 10/21/20 0949 10/22/20 0220 12/10/20 1009 12/10/20 1449 12/13/20 1939 12/22/20 0909 01/02/21 0855 01/03/21 1810 01/04/21 0131 01/06/21 0246  BUN 21*  --  '10 11 16  '$ --  '16 14 17 18 16 16  '$ CREATININE 1.22*   < > 0.87 1.00 1.07* 1.10* 0.84 0.97 1.00 1.72* 1.37* 1.10*   < > = values in this interval not displayed.   Hypokalemia -Low potassium level due to GI loss of potassium.  Improved with replacement. Recent Labs  Lab 01/02/21 0855 01/03/21 1810 01/03/21 2130 01/04/21 0131 01/06/21 0246  K 3.7 3.1*  --  4.1 4.1  MG  --   --  2.1 2.3  --    Okay to discharge home today.  Allergies as of 01/06/2021       Reactions   Tape Other (See Comments)   PEELS SKIN OFF  (PAPER TAPE IS FINE)        Medication List     STOP taking these medications    acetaminophen 500 MG tablet Commonly known as: TYLENOL   Entresto 97-103 MG Generic drug: sacubitril-valsartan   Farxiga 10 MG Tabs tablet Generic drug: dapagliflozin propanediol   furosemide 20 MG tablet Commonly known as: Lasix   LORazepam 1 MG tablet Commonly known as: ATIVAN   spironolactone 25 MG tablet Commonly known as: ALDACTONE       TAKE these medications    ALPRAZolam 1 MG tablet Commonly known as: XANAX Take 1 mg by mouth See admin instructions. Take 1 mg by mouth at bedtime and an additional 1 mg daily as  needed for anxiety   aspirin EC 81 MG tablet Take 81 mg by mouth every evening.   atorvastatin 80 MG tablet Commonly known as: LIPITOR Take 1 tablet (80 mg total) by mouth daily at 6 PM.   carvedilol 6.25 MG tablet Commonly known as: COREG Take 0.5 tablets (3.125 mg total) by mouth 2 (two) times daily.   clopidogrel 75 MG tablet Commonly known as: PLAVIX Take 1 tablet (75 mg total) by mouth daily.   ezetimibe 10 MG tablet Commonly known as: ZETIA Take 1 tablet (10 mg total) by mouth daily.   gabapentin 100 MG capsule Commonly known as: NEURONTIN Take 200 mg by mouth at bedtime.   loperamide 2 MG capsule Commonly known as: IMODIUM Take 2 mg by mouth 4 (four) times daily as needed for diarrhea or loose stools (ibs).   multivitamin with minerals tablet Take 1 tablet by mouth daily.   nitroGLYCERIN 0.4 MG SL tablet Commonly known as: NITROSTAT Place 1 tablet (0.4 mg total) under the tongue every 5 (five) minutes x 3 doses as needed for chest pain (if no  relief after 2nd dose, proceed to the ED for an evaluation or call 911).   omeprazole 20 MG capsule Commonly known as: PRILOSEC Take 1 capsule (20 mg total) by mouth daily.   ondansetron 4 MG disintegrating tablet Commonly known as: Zofran ODT Take 1 tablet (4 mg total) by mouth every 8 (eight) hours as needed for up to 7 days for nausea or vomiting. What changed: reasons to take this   rOPINIRole 0.5 MG tablet Commonly known as: REQUIP Take 0.5 mg by mouth at bedtime.   VITAMIN D-3 PO Take 1 capsule by mouth daily with breakfast.   zinc gluconate 50 MG tablet Take 50 mg by mouth daily.        Discharge Instructions:  Diet Recommendation:  Discharge Diet Orders (From admission, onward)     Start     Ordered   01/06/21 0000  Diet - low sodium heart healthy        01/06/21 1339              Follow with Primary MD [name], MD in 7 days   Get CBC/BMP checked in next visit within 1 week by PCP or  SNF MD ( we routinely change or add medications that can affect your baseline labs and fluid status, therefore we recommend that you get the mentioned basic workup next visit with your PCP, your PCP may decide not to get them or add new tests based on their clinical decision)  On your next visit with your PCP, please Get Medicines reviewed and adjusted.  Please request your PCP  to go over all Hospital Tests and Procedure/Radiological results at the follow up, please get all Hospital records sent to your Prim MD by signing hospital release before you go home.  Activity: As tolerated with Full fall precautions use walker/cane & assistance as needed  For Heart failure patients - Check your Weight same time everyday, if you gain over 2 pounds, or you develop in leg swelling, experience more shortness of breath or chest pain, call your Primary MD immediately. Follow Cardiac Low Salt Diet and 1.5 lit/day fluid restriction.  If you have smoked or chewed Tobacco in the last 2 yrs please stop smoking, stop any regular Alcohol  and or any Recreational drug use.  If you experience worsening of your admission symptoms, develop shortness of breath, life threatening emergency, suicidal or homicidal thoughts you must seek medical attention immediately by calling 911 or calling your MD immediately  if symptoms less severe.  You Must read complete instructions/literature along with all the possible adverse reactions/side effects for all the Medicines you take and that have been prescribed to you. Take any new Medicines after you have completely understood and accpet all the possible adverse reactions/side effects.   Do not drive, operate heavy machinery, perform activities at heights, swimming or participation in water activities or provide baby sitting services if your were admitted for syncope or siezures until you have seen by Primary MD or a Neurologist and advised to do so again.  Do not drive when taking  Pain medications.  Do not take more than prescribed Pain, Sleep and Anxiety Medications  Wear Seat belts while driving.   Please note You were cared for by a hospitalist during your hospital stay. If you have any questions about your discharge medications or the care you received while you were in the hospital after you are discharged, you can call the unit and asked to speak with the  hospitalist on call if the hospitalist that took care of you is not available. Once you are discharged, your primary care physician will handle any further medical issues. Please note that NO REFILLS for any discharge medications will be authorized once you are discharged, as it is imperative that you return to your primary care physician (or establish a relationship with a primary care physician if you do not have one) for your aftercare needs so that they can reassess your need for medications and monitor your lab values.    Follow ups:    Follow-up Information     [name], MD Follow up.   Specialty: Internal Medicine Contact information: [place] [address] [phone]         Larey Dresser, MD .   Specialty: Cardiology Contact information: [phone] [place] [address] [phone]         Constance Haw, MD .   Specialty: Cardiology Contact information: [place] [address] [phone]                 Wound care:     Discharge Exam:   Vitals:   01/05/21 1630 01/05/21 2032 01/06/21 0439 01/06/21 0833  BP: 114/73 104/78 106/71 114/63  Pulse: 62 67 74 78  Resp: '18 19 16 20  '$ Temp: 98.5 F (36.9 C)  98.3 F (36.8 C) 98.2 F (36.8 C)  TempSrc: Oral  Oral Oral  SpO2: 98% 99% 98% 99%  Weight:      Height:        Body mass index is 22.26 kg/m.  General exam: Pleasant, middle-aged Caucasian female.  Not in distress Skin: No rashes, lesions or ulcers. HEENT: Atraumatic,  normocephalic, no obvious bleeding Lungs: Clear to auscultation bilaterally CVS: Regular rate and rhythm, no murmur GI/Abd soft, nontender, nondistended, also present CNS: Alert, awake, oriented x3 Psychiatry: Mood appropriate Extremities: No pedal edema, no calf tenderness  Time coordinating discharge: 35 minutes   The results of significant diagnostics from this hospitalization (including imaging, microbiology, ancillary and laboratory) are listed below for reference.    Procedures and Diagnostic Studies:   CT ABDOMEN PELVIS WO CONTRAST  Result Date: 01/04/2021 CLINICAL DATA:  Left abdominal pain, nausea/vomiting EXAM: CT ABDOMEN AND PELVIS WITHOUT CONTRAST TECHNIQUE: Multidetector CT imaging of the abdomen and pelvis was performed following the standard protocol without IV contrast. COMPARISON:  02/17/2019 FINDINGS: Lower chest: Lung bases are clear. Hepatobiliary: Unenhanced liver is unremarkable. Status post cholecystectomy. No intrahepatic or extrahepatic ductal dilatation. Pancreas: Within normal limits. Spleen: Within normal limits. Adrenals/Urinary Tract: 2.5 cm left adrenal adenoma (series 3/image 16). 10 mm right adrenal adenoma (series 3/image 15). Kidneys are within normal limits. No renal calculi or hydronephrosis. Bladder is within normal limits. Stomach/Bowel: Stomach is within normal limits. No evidence of bowel obstruction. Appendix is not discretely visualized, reportedly surgically absent. No colonic wall thickening or inflammatory changes. Vascular/Lymphatic: No evidence of abdominal aortic aneurysm. Atherosclerotic calcifications of the abdominal aorta and branch vessels. No suspicious abdominopelvic lymphadenopathy. Reproductive: Status post hysterectomy. Right ovary is within normal limits.  No left adnexal mass. Other: No abdominopelvic ascites. Musculoskeletal: Visualized osseous structures are within normal limits. IMPRESSION: No CT findings to account for the patient's  left abdominal pain. Bilateral adrenal adenomas, benign. Electronically Signed   By: Julian Hy M.D.   On: 01/04/2021 02:34   DG Chest 2 View  Result Date: 01/03/2021 CLINICAL  DATA:  Chest pressure. EXAM: CHEST - 2 VIEW COMPARISON:  December 13, 2020 FINDINGS: The single lead AICD device is in good position. The heart, hila, mediastinum, lungs, and pleura are otherwise unremarkable. No acute abnormalities. IMPRESSION: No active cardiopulmonary disease. Electronically Signed   By: Dorise Bullion III M.D.   On: 01/03/2021 19:48     Labs:   Basic Metabolic Panel: Recent Labs  Lab 01/02/21 0855 01/03/21 1810 01/03/21 2130 01/04/21 0131 01/06/21 0246  NA 136 137  --  140 137  K 3.7 3.1*  --  4.1 4.1  CL 102 102  --  107 106  CO2 26 25  --  24 25  GLUCOSE 101* 108*  --  93 91  BUN 17 18  --  16 16  CREATININE 1.00 1.72*  --  1.37* 1.10*  CALCIUM 9.9 9.5  --  9.1 9.5  MG  --   --  2.1 2.3  --    GFR Estimated Creatinine Clearance: 40 mL/min (A) (by C-G formula based on SCr of 1.1 mg/dL (H)). Liver Function Tests: Recent Labs  Lab 01/02/21 0855 01/03/21 1810 01/04/21 0131  AST '24 19 17  '$ ALT '18 15 14  '$ ALKPHOS 76 65 56  BILITOT 1.4* 0.7 0.8  PROT 7.6 6.6 6.1*  ALBUMIN 4.3 3.6 3.2*   Recent Labs  Lab 01/02/21 0855 01/03/21 1810  LIPASE 53* 69*   No results for input(s): AMMONIA in the last 168 hours. Coagulation profile No results for input(s): INR, PROTIME in the last 168 hours.  CBC: Recent Labs  Lab 01/02/21 0855 01/03/21 1810 01/04/21 0131  WBC 8.1 8.5 8.0  NEUTROABS 4.9 4.7 4.1  HGB 13.6 13.0 11.7*  HCT 40.8 39.0 36.6  MCV 87.9 87.6 88.6  PLT 395 396 359   Cardiac Enzymes: No results for input(s): CKTOTAL, CKMB, CKMBINDEX, TROPONINI in the last 168 hours. BNP: Invalid input(s): POCBNP CBG: No results for input(s): GLUCAP in the last 168 hours. D-Dimer Recent Labs    01/03/21 1810  DDIMER 0.53*   Hgb A1c No results for input(s): HGBA1C in  the last 72 hours. Lipid Profile No results for input(s): CHOL, HDL, LDLCALC, TRIG, CHOLHDL, LDLDIRECT in the last 72 hours. Thyroid function studies No results for input(s): TSH, T4TOTAL, T3FREE, THYROIDAB in the last 72 hours.  Invalid input(s): FREET3 Anemia work up No results for input(s): VITAMINB12, FOLATE, FERRITIN, TIBC, IRON, RETICCTPCT in the last 72 hours. Microbiology Recent Results (from the past 240 hour(s))  SARS CORONAVIRUS 2 (TAT 6-24 HRS) Nasopharyngeal Nasopharyngeal Swab     Status: None   Collection Time: 01/02/21  9:01 AM   Specimen: Nasopharyngeal Swab  Result Value Ref Range Status   SARS Coronavirus 2 NEGATIVE NEGATIVE Final    Comment: (NOTE) SARS-CoV-2 target nucleic acids are NOT DETECTED.  The SARS-CoV-2 RNA is generally detectable in upper and lower respiratory specimens during the acute phase of infection. Negative results do not preclude SARS-CoV-2 infection, do not rule out co-infections with other pathogens, and should not be used as the sole basis for treatment or other patient management decisions. Negative results must be combined with clinical observations, patient history, and epidemiological information. The expected result is Negative.  Fact Sheet for Patients: SugarRoll.be  Fact Sheet for Healthcare Providers: https://www.woods-mathews.com/  This test is not yet approved or cleared by the [place] FDA and  has been authorized for detection and/or diagnosis of SARS-CoV-2 by FDA under an Emergency Use Authorization (EUA). This  EUA will remain  in effect (meaning this test can be used) for the duration of the COVID-19 declaration under Se ction 564(b)(1) of the Act, 21 U.S.C. section 360bbb-3(b)(1), unless the authorization is terminated or revoked sooner.  Performed at [facility], [place] [address], [place], [place] [address]   Resp Panel by RT-PCR (Flu A&B, Covid)     Status: None    Collection Time: 01/03/21  9:00 PM   Specimen: Nasopharyngeal(NP) swabs in vial transport medium  Result Value Ref Range Status   SARS Coronavirus 2 by RT PCR NEGATIVE NEGATIVE Final    Comment: (NOTE) SARS-CoV-2 target nucleic acids are NOT DETECTED.  The SARS-CoV-2 RNA is generally detectable in upper respiratory specimens during the acute phase of infection. The lowest concentration of SARS-CoV-2 viral copies this assay can detect is 138 copies/mL. A negative result does not preclude SARS-Cov-2 infection and should not be used as the sole basis for treatment or other patient management decisions. A negative result may occur with  improper specimen collection/handling, submission of specimen other than nasopharyngeal swab, presence of viral mutation(s) within the areas targeted by this assay, and inadequate number of viral copies(<138 copies/mL). A negative result must be combined with clinical observations, patient history, and epidemiological information. The expected result is Negative.  Fact Sheet for Patients:  EntrepreneurPulse.com.au  Fact Sheet for Healthcare Providers:  IncredibleEmployment.be  This test is no t yet approved or cleared by the [place] FDA and  has been authorized for detection and/or diagnosis of SARS-CoV-2 by FDA under an Emergency Use Authorization (EUA). This EUA will remain  in effect (meaning this test can be used) for the duration of the COVID-19 declaration under Section 564(b)(1) of the Act, 21 U.S.C.section 360bbb-3(b)(1), unless the authorization is terminated  or revoked sooner.       Influenza A by PCR NEGATIVE NEGATIVE Final   Influenza B by PCR NEGATIVE NEGATIVE Final    Comment: (NOTE) The Xpert Xpress SARS-CoV-2/FLU/RSV plus assay is intended as an aid in the diagnosis of influenza from Nasopharyngeal swab specimens and should not be used as a sole basis for treatment. Nasal washings  and aspirates are unacceptable for Xpert Xpress SARS-CoV-2/FLU/RSV testing.  Fact Sheet for Patients: EntrepreneurPulse.com.au  Fact Sheet for Healthcare Providers: IncredibleEmployment.be  This test is not yet approved or cleared by the [place] FDA and has been authorized for detection and/or diagnosis of SARS-CoV-2 by FDA under an Emergency Use Authorization (EUA). This EUA will remain in effect (meaning this test can be used) for the duration of the COVID-19 declaration under Section 564(b)(1) of the Act, 21 U.S.C. section 360bbb-3(b)(1), unless the authorization is terminated or revoked.  Performed at [facility], [place] [address], [place], [place] [address]      Signed: Terrilee Croak  [facility] 01/06/2021, 1:39 PM

## 2021-01-06 NOTE — Progress Notes (Signed)
DISCHARGE NOTE Gratz to be discharged Home per MD order. Discussed prescriptions and follow up appointments with the patient. Prescriptions given to patient; medication list explained in detail. Patient verbalized understanding.  Skin clean, dry and intact without evidence of skin break down, no evidence of skin tears noted. IV catheter discontinued intact. Site without signs and symptoms of complications. Dressing and pressure applied. Pt denies pain at the site currently. No complaints noted.  Patient free of lines, drains, and wounds.   An After Visit Summary (AVS) was printed and given to the patient. Patient escorted via wheelchair, and discharged home via private auto.  Orville Govern, RN3

## 2021-01-06 NOTE — Plan of Care (Signed)
  Problem: Health Behavior/Discharge Planning: Goal: Ability to manage health-related needs will improve Outcome: Progressing   Problem: Clinical Measurements: Goal: Ability to maintain clinical measurements within normal limits will improve Outcome: Progressing   Problem: Clinical Measurements: Goal: Will remain free from infection Outcome: Progressing   Problem: Clinical Measurements: Goal: Diagnostic test results will improve Outcome: Progressing   

## 2021-01-06 NOTE — Progress Notes (Signed)
[facility] Progress Note    Since last GI note: EGD yesterday, see full report in Epic  Alpha gal blood test still pending  She tolerated solid food last night and this AM without vomiting. Did have some nausea overnight however.  She asked several questions about her MYchart messages from her labs drawn this AM in hospital.  Objective: Vital signs in last 24 hours: Temp:  [98 F (36.7 C)-98.5 F (36.9 C)] 98.3 F (36.8 C) (08/31 0439) Pulse Rate:  [54-74] 74 (08/31 0439) Resp:  [16-32] 16 (08/31 0439) BP: (103-114)/(40-79) 106/71 (08/31 0439) SpO2:  [98 %-100 %] 98 % (08/31 0439) Last BM Date: 01/05/21 General: alert and oriented times 3 Heart: regular rate and rythm Abdomen: soft, non-tender, non-distended, normal bowel sounds  Lab Results: Recent Labs    01/03/21 1810 01/04/21 0131  WBC 8.5 8.0  HGB 13.0 11.7*  PLT 396 359  MCV 87.6 88.6   Recent Labs    01/03/21 1810 01/04/21 0131 01/06/21 0246  NA 137 140 137  K 3.1* 4.1 4.1  CL 102 107 106  CO2 '25 24 25  '$ GLUCOSE 108* 93 91  BUN '18 16 16  '$ CREATININE 1.72* 1.37* 1.10*  CALCIUM 9.5 9.1 9.5   Recent Labs    01/03/21 1810 01/04/21 0131  PROT 6.6 6.1*  ALBUMIN 3.6 3.2*  AST 19 17  ALT 15 14  ALKPHOS 65 56  BILITOT 0.7 0.8   Medications: Scheduled Meds:  ALPRAZolam  1-2 mg Oral QHS   aspirin EC  81 mg Oral QPM   atorvastatin  80 mg Oral q1800   carvedilol  3.125 mg Oral BID WC   clopidogrel  75 mg Oral Daily   ezetimibe  10 mg Oral Daily   gabapentin  200 mg Oral QHS   LORazepam  0.5-1 mg Oral Daily   pantoprazole (PROTONIX) IV  40 mg Intravenous Q12H   rOPINIRole  0.5 mg Oral QHS   Continuous Infusions:  sodium chloride     PRN Meds:.sodium chloride, acetaminophen **OR** acetaminophen, loperamide, nitroGLYCERIN, polyethylene glycol   Assessment/Plan: 60 y.o. female improved nausea, vomiting  Viral gastro-enteritis?  There is a tick exposure that correlates with the  onset of her symptosm 2 months ago. Alpha gal blood test pending. Biopsies from mild gastritis to check for h. Pylori are also pending.  She is OK for d/c today from my perspective.  CAn use PRN antiemetics going forward.  Please give her a sript for zofran '4mg'$  pills, 1-2 pills BID PRN.  Should be on oral PPI once daily for now as well.    I will contact her with her gastric biopsy and alph gal bloodwork results when they are back.  She should arrange follow up with Dr. Gala Romney who is per primary GI.   Milus Banister, MD  01/06/2021, 8:21 AM [facility] Pager [phone]

## 2021-01-07 ENCOUNTER — Encounter (HOSPITAL_COMMUNITY): Payer: Self-pay | Admitting: Gastroenterology

## 2021-01-08 ENCOUNTER — Telehealth: Payer: Self-pay

## 2021-01-08 MED ORDER — FUROSEMIDE 20 MG PO TABS: 20.0000 mg | ORAL_TABLET | Freq: Every day | ORAL | 3 refills | Status: DC

## 2021-01-08 NOTE — Telephone Encounter (Signed)
Volume looks like it is starting to accumulate.  Would start Lasix 20 mg daily tomorrow.  Would stop Coreg for now with BP still low.  Needs followup in the office early next week (Monday/Tuesday) with APP.

## 2021-01-08 NOTE — Telephone Encounter (Signed)
Returned patient call per voice mail request.    Per pt, she was discharged from hospital on 8/31 with dx of stomach infection, hypotension and severe dehydration due to nausea and vomiting.  BP upon arrival at ED on 8/28 was 76/41  She received following hospital discharge instructions on 8/31: Stop [name], Spironolactone, Lasix.    Today she reports the following: Passed out at midnight last night for 1-2 minutes, BP after episode was 89/47  BP readings: 142/97    on 9/1 AM all meds taken as prescribed at discharge including coreg but 2 hours later she was extremely lightheaded 89/47      on 9/1 after midnight syncope episode  85/48      on 9/2 AM - reading taken before AM meds.  So far she has not taken her AM meds due to low BP  Copy sent to Dr Aundra Dubin for review and recommendations.  01/08/2021 Optivol thoracic impedance suggesting possible fluid accumulation starting 8/27 which correlates with hospital admission and starting to trend back to baseline.   Report does not show any abnormal episodes that would correlate with midnight episode of syncope.

## 2021-01-08 NOTE — Telephone Encounter (Signed)
Spoke with patient and advised of Dr Claris Gladden recommendations:  Stop Coreg for now due to low BP Start tomorrow Lasix 20 mg 1 tablet daily   HF clinic will call to set up appt for next week as soon as possible.  She verbalized understanding for recommendations.  Advised to use ER if symptoms worsen or changes in condition.

## 2021-01-08 NOTE — Telephone Encounter (Signed)
Attempted call to patient to advise of Dr Erin [name] recommendations.  No answer or voice mail option.

## 2021-01-11 LAB — SURGICAL PATHOLOGY

## 2021-01-12 LAB — CUP PACEART REMOTE DEVICE CHECK
Battery Remaining Longevity: 134 mo
Battery Voltage: 3.03 V
Brady Statistic RV Percent Paced: 0.01 %
Date Time Interrogation Session: 20220902082757
HighPow Impedance: 83 Ohm
Implantable Lead Implant Date: 20211126
Implantable Lead Location: 753860
Implantable Pulse Generator Implant Date: 20211126
Lead Channel Impedance Value: 494 Ohm
Lead Channel Impedance Value: 570 Ohm
Lead Channel Pacing Threshold Amplitude: 0.75 V
Lead Channel Pacing Threshold Pulse Width: 0.4 ms
Lead Channel Sensing Intrinsic Amplitude: 6.75 mV
Lead Channel Sensing Intrinsic Amplitude: 6.75 mV
Lead Channel Setting Pacing Amplitude: 2 V
Lead Channel Setting Pacing Pulse Width: 0.4 ms
Lead Channel Setting Sensing Sensitivity: 0.3 mV

## 2021-01-12 LAB — ALPHA GALACTOSIDASE: Alpha galactosidase, serum: 63.9 nmol/hr/mg prt (ref >35.5)

## 2021-01-13 ENCOUNTER — Other Ambulatory Visit: Payer: Self-pay

## 2021-01-13 MED ORDER — CLARITHROMYCIN 500 MG PO TABS: 500.0000 mg | ORAL_TABLET | Freq: Two times a day (BID) | ORAL | 0 refills | Status: AC

## 2021-01-13 MED ORDER — AMOXICILLIN 500 MG PO TABS: 1000.0000 mg | ORAL_TABLET | Freq: Two times a day (BID) | ORAL | 0 refills | Status: AC

## 2021-01-13 MED ORDER — METRONIDAZOLE 500 MG PO TABS: 500.0000 mg | ORAL_TABLET | Freq: Two times a day (BID) | ORAL | 0 refills | Status: AC

## 2021-01-15 NOTE — Progress Notes (Signed)
Remote ICD transmission.   

## 2021-01-18 ENCOUNTER — Ambulatory Visit (INDEPENDENT_AMBULATORY_CARE_PROVIDER_SITE_OTHER): Payer: 59

## 2021-01-18 DIAGNOSIS — Z95810 Presence of automatic (implantable) cardiac defibrillator: Secondary | ICD-10-CM

## 2021-01-18 DIAGNOSIS — I5042 Chronic combined systolic (congestive) and diastolic (congestive) heart failure: Secondary | ICD-10-CM

## 2021-01-20 ENCOUNTER — Telehealth: Payer: Self-pay

## 2021-01-20 NOTE — Telephone Encounter (Signed)
Remote ICM transmission received.  Attempted call to patient regarding ICM remote transmission and no answer.  

## 2021-01-20 NOTE — Progress Notes (Signed)
EPIC Encounter for ICM Monitoring  Patient Name: Erin [name] is a 60 y.o. female Date: 01/20/2021 Primary Care Physican: [name], MD Primary Cardiologist: Aundra Dubin Electrophysiologist: Curt Bears 09/28/2020 Weight: 116 lbs  10/06/2020 Weight: 119 lbs                                                           Attempted call to patient and unable to reach.  Transmission reviewed.     Optivol thoracic impedance suggesting possible fluid accumulation starting 9/1 and trending back toward baseline.   Prescribed: Furosemide 20 mg Take 1 tablets (20 mg total) by mouth daily.   Labs: 01/06/2021 Creatinine 1.10, BUN 16, Potassium 4.1, Sodium 137, GFR 58 01/04/2021 Creatinine 1.37, BUN 16, Potassium 4.1, Sodium 140, GFR 45  01/03/2021 Creatinine 1.72, BUN 18, Potassium 3.1, Sodium 137, GFR 34  01/02/2021 Creatinine 1.00, BUN 17, Potassium 3.7, Sodium 136, GFR >60  12/22/2020 Creatinine 0.97, BUN 14, Potassium 3.6, Sodium 139, GFR >60  12/13/2020 Creatinine 0.84, BUN 16, Potassium 3.1, Sodium 138, GFR >60 12/10/2020 Creatinine 1.07, BUN 16, Potassium 3.7, Sodium 137, GFR >60  10/22/2020 Creatinine 1.00, BUN 11, Potassium 3.5, Sodium 139, GFR >60  10/21/2020 Creatinine 0.87, BUN 10, Potassium 3.8, Sodium 138, GFR >60  10/19/2020 Creatinine 1.22, BUN 21, Potassium 4.0, Sodium 136 10/07/2020 Creatinine 1.03, BUN 18, Potassium 4.4, Sodium 142, GFR 63  A complete set of results can be found in Results Review.   Recommendations: Unable to reach.   Follow-up plan: ICM clinic phone appointment on 02/01/2021 (fluid levels will be checked at 9/19 at HF OV).   91 day device clinic remote transmission 04/05/2021.     EP/Cardiology Office Visits:  01/25/2021 with Dr Aundra Dubin   Copy of ICM check sent to Dr. Curt Bears.  Will route to Dr Aundra Dubin for review and recommendations if patient is reached.   3 month ICM trend: 01/18/2021.    1 Year ICM trend:       Rosalene Billings, RN 01/20/2021 2:15 PM

## 2021-01-23 NOTE — Progress Notes (Signed)
Advanced Heart Failure Clinic Progress Note   PCP: [name], MD HF Cardiology: [name]  Reason for Visit: [facility] F/u, Chronic Systolic Heart Failure   60 y.o. with history of CAD, ischemic cardiomyopathy, and PAD was referred by [name] for evaluation of CHF.  She had NSTEMI in 11/17 with PCI to prox/mid LCx and RCA.  Subsequently, has developed ischemic cardiomyopathy.  Most recent echo in 1/21 showed EF 30-35%.  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   RHC/LHC done with exertional chest heaviness and dyspnea in 4/21 showed normal filling pressures, preserved cardiac output, and nonobstructive mild CAD.  ABIs were normal in 4/21.   In 8/21, she had syncope thought to be related to orthostasis from low BP in the setting of cardiac medication titration.  Entresto was decreased.   She was in the ER in 11/21 with chest pain.  Troponin and COVID-19 negative. Echo in 11/21 showed EF 30-35%, normal RV.   She was hospitalized in 6/22 with left-sided weakness/numbness.  No evidence for acute CVA, ?TIA.  She is on ASA and Plavix.   Admitted 8/28-8/31/22 with hypotension and AKI 2/2 intractable nausea and vomiting. HF medications initially held. GI consulted and underwent  EGD which showed mild nonspecific distal gastritis with biopsies taken and alpha gal labs drawn. Her AKI resolved, SCr 1.7>>1.1. All HF meds, except for lasix, held on discharge due to low BP.   Today she returns for post hospitalization follow up. Overall feeling "fair". Continues w/ nausea and abdominal discomfort but no vomitting.  Denies increasing SOB, CP, dizziness, edema, or PND/Orthopnea. No fever or chills. Clinic BP today 112/66. SBPs at home ~110. She denies any significant wt gain, however she is volume up based on optivol. Fluid index has been steadily increasing since discharge from the hospital, 8/31, and crossed threshold ~ 9/5. Impendence down. No AF. No  VT/VF.    Labs (2/21): K 3.7, creatinine 0.82 Labs (4/21): K 4.3, creatinine 0.81, LDL 67, TGs 286 Labs (6/21): K 4.7, creatinine 1.22 Labs (11/21): K 3.7, creatinine 0.84, hgb 12.6, LDL 67, HDL 48, TGs 286 Labs (1/22). K 4.4, creatinine 1.03 Labs (6/22): K 3.5, creatinine 1.0, LDL 7 Labs (8/22): K 4.1, creatinine 1.10   Medtronic device interrogation: thoracic impedance down with fluid index > threshold.  No AF/VT/VF.   PMH: 1. CAD: NSTEMI with DES to proximal and mid LCx in 11/17, staged DES x 2 to RCA later in 11/17.  - Cardiolite (10/20): EF < 30%, large inferior and inferolateral MI with mild peri-infarct ischemia.  - LHC (4/21): Patent stents, nonobstructive CAD.  2. Chronic systolic CHF: Ischemic cardiomyopathy. Medtronic ICD.  - Echo (10/20): EF 35-40%, lateral WMAs, normal RV. - Echo (1/21): EF 30-35%, mild LVH, normal RV - RHC (4/21): mean RA 5, PA 29/3, mean PCWP 12, CI 3.04 - Echo (11/21): EF 30-35%, normal RV.  3. 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  4. Carotid stenosis: Carotid dopplers ([id number]) with A999333 LICA stenosis.  - Carotid dopplers (6/21): 40-59% BICA stenosis.  - Carotid dopplers (1/22): 123456 RICA, [id number] LICA.  5. Active smoker.  6. PAD: ABIs (4/21) Normal.  - Peripheral artery dopplers (12/21): bilateral plaque without focal stenosis.   Social History   Socioeconomic History   Marital status: Married    Spouse name: Not on file   Number of children: Not on file   Years of education: Not on file  Highest education level: Not on file  Occupational History   Occupation: CNA  Tobacco Use   Smoking status: Some Days    Packs/day: 0.10    Years: 15.00    Pack years: 1.50    Types: Cigarettes   Smokeless tobacco: Never   Tobacco comments:    smokes a cigarette occasionally  Vaping Use   Vaping Use: Never used  Substance and Sexual Activity   Alcohol use: Yes    Comment: occasionally   Drug use: Not  Currently    Types: Marijuana    Comment: former- 2017 last time   Sexual activity: Not Currently    Birth control/protection: Surgical    Comment: hyst  Other Topics Concern   Not on file  Social History Narrative   Lives with husband in [place] in a one story home with a basement.  Has 4 children.  Works as a Quarry manager.  Education: CNA school.    Social Determinants of Health   Financial Resource Strain: Not on file  Food Insecurity: Not on file  Transportation Needs: Not on file  Physical Activity: Not on file  Stress: Not on file  Social Connections: Not on file  Intimate Partner Violence: Not on file   Family History  Problem Relation Age of Onset   Diabetes Father    Hypertension Father    CAD Father    Colon polyps Father 8       pre-cancerous    Stroke Father    Dementia Father    Stroke Mother    Hypertension Mother    Diabetes Mother    Heart failure Other    Breast cancer Maternal Grandmother    Colon cancer Neg Hx    ROS: All systems reviewed and negative except as per HPI.   Current Outpatient Medications  Medication Sig Dispense Refill   ALPRAZolam (XANAX) 1 MG tablet Take 1 mg by mouth See admin instructions. Take 1 mg by mouth at bedtime and an additional 1 mg daily as needed for anxiety     aspirin EC 81 MG tablet Take 81 mg by mouth every evening.      atorvastatin (LIPITOR) 80 MG tablet Take 1 tablet (80 mg total) by mouth daily at 6 PM. 90 tablet 3   Cholecalciferol (VITAMIN D-3 PO) Take 1 capsule by mouth daily with breakfast.     clopidogrel (PLAVIX) 75 MG tablet Take 1 tablet (75 mg total) by mouth daily. 30 tablet 3   dapagliflozin propanediol (FARXIGA) 10 MG TABS tablet Take 1 tablet (10 mg total) by mouth daily before breakfast. 30 tablet 11   ezetimibe (ZETIA) 10 MG tablet Take 1 tablet (10 mg total) by mouth daily. 30 tablet 6   furosemide (LASIX) 20 MG tablet Take 1 tablet (20 mg total) by mouth daily. 90 tablet 3   gabapentin (NEURONTIN) 100 MG  capsule Take 200 mg by mouth at bedtime.     loperamide (IMODIUM) 2 MG capsule Take 2 mg by mouth 4 (four) times daily as needed for diarrhea or loose stools (ibs).      Multiple Vitamins-Minerals (MULTIVITAMIN WITH MINERALS) tablet Take 1 tablet by mouth daily.     nitroGLYCERIN (NITROSTAT) 0.4 MG SL tablet Place 1 tablet (0.4 mg total) under the tongue every 5 (five) minutes x 3 doses as needed for chest pain (if no relief after 2nd dose, proceed to the ED for an evaluation or call 911). 25 tablet 2   omeprazole (PRILOSEC) 20  MG capsule Take 1 capsule (20 mg total) by mouth daily. 30 capsule 0   rOPINIRole (REQUIP) 0.5 MG tablet Take 0.5 mg by mouth at bedtime.   0   spironolactone (ALDACTONE) 25 MG tablet Take 0.5 tablets (12.5 mg total) by mouth at bedtime. 45 tablet 3   zinc gluconate 50 MG tablet Take 50 mg by mouth daily.     No current facility-administered medications for this encounter.   BP 112/66   Pulse 93   Wt 53.3 kg (117 lb 9.6 oz)   SpO2 100%   BMI 22.97 kg/m  PHYSICAL EXAM: General:  thin appearing WF, looks older than actual age No respiratory difficulty HEENT: normal Neck: supple. JVD 8 cm. Carotids 2+ bilat; no bruits. No lymphadenopathy or thyromegaly appreciated. Cor: PMI nondisplaced. Regular rate & rhythm. No rubs, gallops or murmurs. Lungs: clear bilaterally, no wheezing  Abdomen: soft, + diffuse tenderness, mildly distended. No hepatosplenomegaly. No bruits or masses. Good bowel sounds. Extremities: no cyanosis, clubbing, rash, edema, warm  Neuro: alert & oriented x 3, cranial nerves grossly intact. moves all 4 extremities w/o difficulty. Affect pleasant.   Assessment/Plan:  1. Acute on Chronic Systolic CHF: Ischemic cardiomyopathy. Echo in 11/21 showed stable EF 30-35%.  RHC in 4/21 with normal filling pressures and preserved cardiac output.  GDMT held during recent admission given AKI (resolved) and hypotension. SBPs now stable in the 110s. No orthostatic  symptoms. SCr day of d/c back to normal at 1.1. She is now fluid overloaded by Optivol. NYHA Class II.  - Continue Lasix 20 mg daily  - Restart Farxiga 10 mg daily  - Restart Spiro 12.5 mg qhs - Continue to [employer] for now (doubt BP will tolerate) - Check CMP today and f/u BMP again in 7 days - she will closely monitor BP at home and will notify us if any orthostatic symptoms - Update Echo to reassess LVEF - F/u in 2 weeks to reassess volume status and symptoms. I suspect her persistent nausea and abdominal discomfort may be due to fluid. LFTs were ok on recent hospitalization and her SCr improved w/ IVF hydration. Will repeat CMP today to assess SCr and hepatic enzymes. If abnormal and if symptoms persists despite diuresis, would recommend RHC at f/u to assess hemodynamics and r/o low output. Exam today is not highly concerning.  - Continue to hold Coreg for now.  2. CAD: S/p NSTEMI in 11/17 with DEX to LCx and DES x 2 to RCA.  Cardiolite in 10/20 with EF <30%, inferior/inferolateral infarct with peri-infarct ischemia. LHC in 4/21 showed nonobstructive mild CAD.  She denies CP.  - Continue atorvastatin and Zetia. Good LDL 6/22 - Continue ASA 81 and Plavix 75 mg daily for now.  She will likely be a good candidate to replace Plavix with rivaroxaban 2.5 mg bid in the future. She will continue Plavix as long as Dr. Estanislado Pandy feels it is needed post her cranial circulation intervention.  3. Carotid stenosis: Repeat carotid dopplers in 1/23.  4. Smoking: smoking cessation advised   5. 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  - Continue Plavix per Dr. Estanislado Pandy.   F/u w/ APP in 2 weeks.   Lyda Jester, PA-C  01/25/2021

## 2021-01-25 ENCOUNTER — Emergency Department (HOSPITAL_COMMUNITY): Payer: 59

## 2021-01-25 ENCOUNTER — Ambulatory Visit (HOSPITAL_BASED_OUTPATIENT_CLINIC_OR_DEPARTMENT_OTHER)
Admission: RE | Admit: 2021-01-25 | Discharge: 2021-01-25 | Disposition: A | Discharge status: Home / Self Care | Payer: 59 | Source: Ambulatory Visit | Attending: Family Medicine | Admitting: Family Medicine

## 2021-01-25 ENCOUNTER — Encounter (HOSPITAL_COMMUNITY): Payer: Self-pay

## 2021-01-25 ENCOUNTER — Other Ambulatory Visit: Payer: Self-pay

## 2021-01-25 ENCOUNTER — Encounter (HOSPITAL_COMMUNITY): Payer: Self-pay | Admitting: *Deleted

## 2021-01-25 ENCOUNTER — Emergency Department (HOSPITAL_COMMUNITY)
Admission: EM | Admit: 2021-01-25 | Discharge: 2021-01-25 | Disposition: A | Discharge status: Home / Self Care | Payer: 59 | Attending: Emergency Medicine | Admitting: Emergency Medicine

## 2021-01-25 VITALS — BP 112/66 | HR 93 | Wt 117.6 lb

## 2021-01-25 DIAGNOSIS — Z955 Presence of coronary angioplasty implant and graft: Secondary | ICD-10-CM | POA: Insufficient documentation

## 2021-01-25 DIAGNOSIS — I255 Ischemic cardiomyopathy: Secondary | ICD-10-CM | POA: Insufficient documentation

## 2021-01-25 DIAGNOSIS — I110 Hypertensive heart disease with heart failure: Secondary | ICD-10-CM | POA: Diagnosis not present

## 2021-01-25 DIAGNOSIS — I252 Old myocardial infarction: Secondary | ICD-10-CM | POA: Insufficient documentation

## 2021-01-25 DIAGNOSIS — Z7982 Long term (current) use of aspirin: Secondary | ICD-10-CM | POA: Insufficient documentation

## 2021-01-25 DIAGNOSIS — I2510 Atherosclerotic heart disease of native coronary artery without angina pectoris: Secondary | ICD-10-CM | POA: Insufficient documentation

## 2021-01-25 DIAGNOSIS — Z7902 Long term (current) use of antithrombotics/antiplatelets: Secondary | ICD-10-CM | POA: Diagnosis not present

## 2021-01-25 DIAGNOSIS — I5023 Acute on chronic systolic (congestive) heart failure: Secondary | ICD-10-CM | POA: Insufficient documentation

## 2021-01-25 DIAGNOSIS — Z79899 Other long term (current) drug therapy: Secondary | ICD-10-CM | POA: Diagnosis not present

## 2021-01-25 DIAGNOSIS — K7689 Other specified diseases of liver: Secondary | ICD-10-CM | POA: Diagnosis not present

## 2021-01-25 DIAGNOSIS — Z85828 Personal history of other malignant neoplasm of skin: Secondary | ICD-10-CM | POA: Insufficient documentation

## 2021-01-25 DIAGNOSIS — Z7984 Long term (current) use of oral hypoglycemic drugs: Secondary | ICD-10-CM | POA: Insufficient documentation

## 2021-01-25 DIAGNOSIS — K769 Liver disease, unspecified: Secondary | ICD-10-CM

## 2021-01-25 DIAGNOSIS — F17210 Nicotine dependence, cigarettes, uncomplicated: Secondary | ICD-10-CM | POA: Insufficient documentation

## 2021-01-25 DIAGNOSIS — Z96692 Finger-joint replacement of left hand: Secondary | ICD-10-CM | POA: Insufficient documentation

## 2021-01-25 DIAGNOSIS — Z8249 Family history of ischemic heart disease and other diseases of the circulatory system: Secondary | ICD-10-CM | POA: Insufficient documentation

## 2021-01-25 DIAGNOSIS — I6529 Occlusion and stenosis of unspecified carotid artery: Secondary | ICD-10-CM | POA: Insufficient documentation

## 2021-01-25 DIAGNOSIS — R1030 Lower abdominal pain, unspecified: Secondary | ICD-10-CM | POA: Diagnosis present

## 2021-01-25 DIAGNOSIS — I5042 Chronic combined systolic (congestive) and diastolic (congestive) heart failure: Secondary | ICD-10-CM | POA: Diagnosis not present

## 2021-01-25 LAB — URINALYSIS, ROUTINE W REFLEX MICROSCOPIC
Bilirubin Urine: NEGATIVE
Glucose, UA: NEGATIVE mg/dL
Hgb urine dipstick: NEGATIVE
Ketones, ur: NEGATIVE mg/dL
Leukocytes,Ua: NEGATIVE
Nitrite: NEGATIVE
Protein, ur: NEGATIVE mg/dL
Specific Gravity, Urine: 1.011 (ref 1.005–1.030)
pH: 7 (ref 5.0–8.0)

## 2021-01-25 LAB — COMPREHENSIVE METABOLIC PANEL
ALT: 11 U/L (ref 0–44)
ALT: 12 U/L (ref 0–44)
AST: 20 U/L (ref 15–41)
AST: 18 U/L (ref 15–41)
Albumin: 3.5 g/dL (ref 3.5–5.0)
Albumin: 3.7 g/dL (ref 3.5–5.0)
Alkaline Phosphatase: 63 U/L (ref 38–126)
Alkaline Phosphatase: 69 U/L (ref 38–126)
Anion gap: 9 (ref 5–15)
Anion gap: 7 (ref 5–15)
BUN: 13 mg/dL (ref 6–20)
BUN: 14 mg/dL (ref 6–20)
CO2: 25 mmol/L (ref 22–32)
CO2: 28 mmol/L (ref 22–32)
Calcium: 9.8 mg/dL (ref 8.9–10.3)
Calcium: 9.2 mg/dL (ref 8.9–10.3)
Chloride: 102 mmol/L (ref 98–111)
Chloride: 104 mmol/L (ref 98–111)
Creatinine, Ser: 0.86 mg/dL (ref 0.44–1.00)
Creatinine, Ser: 0.93 mg/dL (ref 0.44–1.00)
GFR, Estimated: >60 mL/min (ref >60)
GFR, Estimated: >60 mL/min (ref >60)
Glucose, Bld: 100 mg/dL — ABNORMAL HIGH (ref 70–99)
Glucose, Bld: 82 mg/dL (ref 70–99)
Potassium: 4.2 mmol/L (ref 3.5–5.1)
Potassium: 3.6 mmol/L (ref 3.5–5.1)
Sodium: 136 mmol/L (ref 135–145)
Sodium: 139 mmol/L (ref 135–145)
Total Bilirubin: 0.8 mg/dL (ref 0.3–1.2)
Total Bilirubin: 0.7 mg/dL (ref 0.3–1.2)
Total Protein: 6.4 g/dL — ABNORMAL LOW (ref 6.5–8.1)
Total Protein: 7 g/dL (ref 6.5–8.1)

## 2021-01-25 LAB — CBC
HCT: 35.9 % — ABNORMAL LOW (ref 36.0–46.0)
Hemoglobin: 11.6 g/dL — ABNORMAL LOW (ref 12.0–15.0)
MCH: 28.9 pg (ref 26.0–34.0)
MCHC: 32.3 g/dL (ref 30.0–36.0)
MCV: 89.3 fL (ref 80.0–100.0)
Platelets: 394 10*3/uL (ref 150–400)
RBC: 4.02 MIL/uL (ref 3.87–5.11)
RDW: 14.5 % (ref 11.5–15.5)
WBC: 6.9 10*3/uL (ref 4.0–10.5)
nRBC: 0 % (ref 0.0–0.2)

## 2021-01-25 LAB — LIPASE, BLOOD: Lipase: 64 U/L — ABNORMAL HIGH (ref 11–51)

## 2021-01-25 LAB — BRAIN NATRIURETIC PEPTIDE: B Natriuretic Peptide: 123.1 pg/mL — ABNORMAL HIGH (ref 0.0–100.0)

## 2021-01-25 MED ORDER — DAPAGLIFLOZIN PROPANEDIOL 10 MG PO TABS: 10.0000 mg | ORAL_TABLET | Freq: Every day | ORAL | 11 refills | Status: DC

## 2021-01-25 MED ORDER — ACETAMINOPHEN 325 MG PO TABS
650.0000 mg | ORAL_TABLET | Freq: Once | ORAL | Status: AC
  Administered 2021-01-25: 650 mg via ORAL
  Filled 2021-01-25 (×2): qty 2

## 2021-01-25 MED ORDER — IOHEXOL 350 MG/ML SOLN
100.0000 mL | Freq: Once | INTRAVENOUS | Status: AC | PRN
  Administered 2021-01-25: 80 mL via INTRAVENOUS

## 2021-01-25 MED ORDER — SPIRONOLACTONE 25 MG PO TABS: 12.5000 mg | ORAL_TABLET | Freq: Every day | ORAL | 3 refills | Status: DC

## 2021-01-25 NOTE — Discharge Instructions (Addendum)
The cause for your abdominal pain is unclear.  However your labs were normal and your CT scan did not show a clear cause.  There was however a spot seen on your liver that is unlikely to be the cause of any pain today.  You will need to follow-up with your doctor regarding this finding within the next 1 or 2 weeks.  Return back to the ER if you have fevers vomiting worsening symptoms or any additional concerns.

## 2021-01-25 NOTE — ED Notes (Signed)
Patient transported to CT 

## 2021-01-25 NOTE — ED Provider Notes (Signed)
[facility] Provider Note   CSN: [id number] Arrival date & time: 01/25/21  1434     History Chief Complaint  Patient presents with   Abdominal Pain    OCEANIA SHENBERGER is a 60 y.o. female.  Patient presents with chief complaint of lower abdominal pain describes as aching intermittent waxing and waning also associate with bilateral flank pain.  Symptoms been ongoing for a week without significant change.  She denies otherwise any fevers or cough.  Denies vomiting or diarrhea.  Currently describes the pain as a low level.      Past Medical History:  Diagnosis Date   Anxiety state 03/25/2016   Basal cell carcinoma of forehead    Brain aneurysm    CHF (congestive heart failure) (HCC)    Coronary artery disease    a. 03/11/16 PCI with DES-->Prox/Mid Cx;  b. 03/14/16 PCI with DES x2-->RCA, EF 30-35%.   Essential hypertension    GERD (gastroesophageal reflux disease)    HFrEF (heart failure with reduced ejection fraction) ([place])    a. 10/2016 Echo: EF 35-40%, Gr1 DD, mild focal basal septal hypertrophy, basal inflat, mid inflat, basal antlat AK. Mid infept/inf/antlat, apical lateral sev HK. Mod MR. mildly reduced RV fxn. Mild TR.   History of pneumonia    Hyperlipidemia    IBS (irritable bowel syndrome)    Ischemic cardiomyopathy    a. 10/2016 Echo: EF 35-40%, Gr1 DD.   Mitral regurgitation    NSTEMI (non-ST elevated myocardial infarction) ([place]) 03/10/2016   Pneumonia 03/2016   Squamous cell cancer of skin of nose    Thrombocytosis 03/26/2016   Tobacco abuse    Trichimoniasis    Wears dentures    Wears glasses     Patient Active Problem List   Diagnosis Date Noted   AKI (acute kidney injury) ([place]) 01/04/2021   Intractable nausea and vomiting 01/03/2021   Left-sided weakness 10/19/2020   Cough 02/11/2020   Near syncope 12/10/2019   Brain aneurysm 04/15/2019   Dysphagia 02/27/2018   Encounter for screening colonoscopy 02/27/2018   History of  Clostridium difficile infection 02/27/2018   Frequent stools 12/20/2017   Chronic combined systolic and diastolic congestive heart failure ([place]) 06/20/2016   Hypokalemia due to excessive gastrointestinal loss of potassium    Acute CHF (congestive heart failure) ([place]) 05/16/2016   Acute on chronic systolic CHF (congestive heart failure) ([place]) 05/16/2016   Acute respiratory failure with hypoxia (HCC)    Thrombocytosis 03/26/2016   Cardiomyopathy, ischemic 03/25/2016   Chronic combined systolic and diastolic heart failure ([place]) 03/25/2016   Anxiety state 03/25/2016   Troponin level elevated 03/25/2016   Coronary artery disease involving native coronary artery of native heart 03/25/2016   Normocytic anemia 03/25/2016   SOB (shortness of breath) 03/24/2016   Lightheadedness 03/17/2016   Hypotension 03/17/2016   Tobacco abuse 03/12/2016   NSTEMI (non-ST elevated myocardial infarction) ([place]) 03/11/2016   Atypical chest pain    Essential hypertension 09/06/2015   Mixed hyperlipidemia 09/06/2015   GERD without esophagitis 09/06/2015   Chest pain 09/06/2015    Past Surgical History:  Procedure Laterality Date   APPENDECTOMY     BIOPSY  09/20/2018   Procedure: BIOPSY;  Surgeon: Daneil Dolin, MD;  [facility];  Service: Endoscopy;;  colon   BIOPSY  01/05/2021   Procedure: BIOPSY;  Surgeon: Milus Banister, MD;  Location: [facility] ENDOSCOPY;  Service: Endoscopy;;   CARDIAC CATHETERIZATION N/A 03/11/2016   Procedure: Left Heart  Cath and Coronary Angiography;  Surgeon: Leonie Man, MD;  Location: [facility] CV LAB;  Service: Cardiovascular;  Laterality: N/A;   CARDIAC CATHETERIZATION N/A 03/11/2016   Procedure: Coronary Stent Intervention;  Surgeon: Leonie Man, MD;  Location: [facility] LAB;  Service: Cardiovascular;  Laterality: N/A;   CARDIAC CATHETERIZATION N/A 03/14/2016   Procedure: Coronary Stent Intervention;  Surgeon: Peter M Martinique, MD;  Location: [facility]  LAB;  Service: Cardiovascular;  Laterality: N/A;   CHOLECYSTECTOMY OPEN  1984   COLONOSCOPY WITH PROPOFOL N/A 09/20/2018   Procedure: COLONOSCOPY WITH PROPOFOL;  Surgeon: Daneil Dolin, MD;  [facility];  Service: Endoscopy;  Laterality: N/A;  10:30am   CORONARY ANGIOPLASTY WITH STENT PLACEMENT  03/14/2016   ESOPHAGOGASTRODUODENOSCOPY (EGD) WITH PROPOFOL N/A 09/20/2018   Procedure: ESOPHAGOGASTRODUODENOSCOPY (EGD) WITH PROPOFOL;  Surgeon: Daneil Dolin, MD;  [facility];  Service: Endoscopy;  Laterality: N/A;   ESOPHAGOGASTRODUODENOSCOPY (EGD) WITH PROPOFOL N/A 01/05/2021   Procedure: ESOPHAGOGASTRODUODENOSCOPY (EGD) WITH PROPOFOL;  Surgeon: Milus Banister, MD;  Location: [facility] ENDOSCOPY;  Service: Endoscopy;  Laterality: N/A;   FINGER ARTHROPLASTY Left 05/14/2013   Procedure: LEFT THUMB CARPAL METACARPAL ARTHROPLASTY;  Surgeon: Tennis Must, MD;  Location: [facility];  Service: Orthopedics;  Laterality: Left;   ICD IMPLANT N/A 04/03/2020   Procedure: ICD IMPLANT;  Surgeon: Constance Haw, MD;  Location: [facility] LAB;  Service: Cardiovascular;  Laterality: N/A;   IR ANGIO INTRA EXTRACRAN SEL COM CAROTID INNOMINATE BILAT MOD SED  01/05/2017   IR ANGIO INTRA EXTRACRAN SEL COM CAROTID INNOMINATE BILAT MOD SED  03/19/2019   IR ANGIO INTRA EXTRACRAN SEL COM CAROTID INNOMINATE BILAT MOD SED  06/04/2020   IR ANGIO INTRA EXTRACRAN SEL INTERNAL CAROTID UNI L MOD SED  04/15/2019   IR ANGIO VERTEBRAL SEL VERTEBRAL BILAT MOD SED  01/05/2017   IR ANGIO VERTEBRAL SEL VERTEBRAL BILAT MOD SED  03/19/2019   IR ANGIO VERTEBRAL SEL VERTEBRAL UNI L MOD SED  06/04/2020   IR ANGIOGRAM FOLLOW UP STUDY  04/15/2019   IR RADIOLOGIST EVAL & MGMT  12/30/2016   IR TRANSCATH/EMBOLIZ  04/15/2019   IR US GUIDE VASC ACCESS RIGHT  03/19/2019   IR US GUIDE VASC ACCESS RIGHT  06/04/2020   MALONEY DILATION N/A 09/20/2018   Procedure: Venia Minks DILATION;  Surgeon: Daneil Dolin, MD;   [facility];  Service: Endoscopy;  Laterality: N/A;   RADIOLOGY WITH ANESTHESIA N/A 04/15/2019   Procedure: [employer];  Surgeon: Luanne Bras, MD;  Location: [facility];  Service: Radiology;  Laterality: N/A;   RIGHT/LEFT HEART CATH AND CORONARY ANGIOGRAPHY N/A 08/19/2019   Procedure: RIGHT/LEFT HEART CATH AND CORONARY ANGIOGRAPHY;  Surgeon: Larey Dresser, MD;  Location: [facility];  Service: Cardiovascular;  Laterality: N/A;   TUBAL LIGATION  1987   VAGINAL HYSTERECTOMY  2009     OB History     Gravida  4   Para  4   Term  4   Preterm      AB      Living  4      SAB      IAB      Ectopic      Multiple      Live Births              Family History  Problem Relation Age of Onset   Diabetes Father    Hypertension Father  CAD Father    Colon polyps Father 67       pre-cancerous    Stroke Father    Dementia Father    Stroke Mother    Hypertension Mother    Diabetes Mother    Heart failure Other    Breast cancer Maternal Grandmother    Colon cancer Neg Hx     Social History   Tobacco Use   Smoking status: Some Days    Packs/day: 0.10    Years: 15.00    Pack years: 1.50    Types: Cigarettes   Smokeless tobacco: Never   Tobacco comments:    smokes a cigarette occasionally  Vaping Use   Vaping Use: Never used  Substance Use Topics   Alcohol use: Yes    Comment: occasionally   Drug use: Not Currently    Types: Marijuana    Comment: former- 2017 last time    Home Medications Prior to Admission medications   Medication Sig Start Date End Date Taking? Authorizing Provider  ALPRAZolam Duanne Moron) 1 MG tablet Take 1 mg by mouth See admin instructions. Take 1 mg by mouth at bedtime and an additional 1 mg daily as needed for anxiety    [provider]  aspirin EC 81 MG tablet Take 81 mg by mouth every evening.     [provider]  atorvastatin (LIPITOR) 80 MG tablet Take 1 tablet (80 mg total) by mouth daily at  6 PM. 11/10/20   Larey Dresser, MD  Cholecalciferol (VITAMIN D-3 PO) Take 1 capsule by mouth daily with breakfast.    [provider]  clopidogrel (PLAVIX) 75 MG tablet Take 1 tablet (75 mg total) by mouth daily. 10/27/20   Louk, Bea Graff, PA-C  dapagliflozin propanediol (FARXIGA) 10 MG TABS tablet Take 1 tablet (10 mg total) by mouth daily before breakfast. 01/25/21   [place]ford, Maricela Bo, FNP  ezetimibe (ZETIA) 10 MG tablet Take 1 tablet (10 mg total) by mouth daily. 07/07/20   Larey Dresser, MD  furosemide (LASIX) 20 MG tablet Take 1 tablet (20 mg total) by mouth daily. 01/08/21 04/08/21  Larey Dresser, MD  gabapentin (NEURONTIN) 100 MG capsule Take 200 mg by mouth at bedtime.    [provider]  loperamide (IMODIUM) 2 MG capsule Take 2 mg by mouth 4 (four) times daily as needed for diarrhea or loose stools (ibs).     [provider]  Multiple Vitamins-Minerals (MULTIVITAMIN WITH MINERALS) tablet Take 1 tablet by mouth daily.    [provider]  nitroGLYCERIN (NITROSTAT) 0.4 MG SL tablet Place 1 tablet (0.4 mg total) under the tongue every 5 (five) minutes x 3 doses as needed for chest pain (if no relief after 2nd dose, proceed to the ED for an evaluation or call 911). 09/01/20   Verta Ellen., NP  omeprazole (PRILOSEC) 20 MG capsule Take 1 capsule (20 mg total) by mouth daily. 01/06/21 02/05/21  Terrilee Croak, MD  rOPINIRole (REQUIP) 0.5 MG tablet Take 0.5 mg by mouth at bedtime.  11/13/17   [provider]  spironolactone (ALDACTONE) 25 MG tablet Take 0.5 tablets (12.5 mg total) by mouth at bedtime. 01/25/21 04/25/21  Rafael Bihari, FNP  zinc gluconate 50 MG tablet Take 50 mg by mouth daily.    [provider]    Allergies    Tape  Review of Systems   Review of Systems  Constitutional:  Negative for fever.  HENT:  Negative for  ear pain.   Eyes:  Negative for pain.  Respiratory:  Negative for cough.   Cardiovascular:  Negative  for chest pain.  Gastrointestinal:  Positive for abdominal pain.  Genitourinary:  Negative for flank pain.  Musculoskeletal:  Negative for back pain.  Skin:  Negative for rash.  Neurological:  Negative for headaches.   Physical Exam Updated Vital Signs BP 139/86 (BP Location: Left Arm)   Pulse 79   Temp 98.4 F (36.9 C) (Oral)   Resp 16   Ht 5' (1.524 m)   Wt 53.1 kg   SpO2 100%   BMI 22.85 kg/m   Physical Exam Constitutional:      General: She is not in acute distress.    Appearance: Normal appearance.  HENT:     Head: Normocephalic.     Nose: Nose normal.  Eyes:     Extraocular Movements: Extraocular movements intact.  Cardiovascular:     Rate and Rhythm: Normal rate.  Pulmonary:     Effort: Pulmonary effort is normal.  Abdominal:     Comments: Mild bilateral lower abdominal tenderness to palpation.  No guarding or rebound noted.  Musculoskeletal:        General: Normal range of motion.     Cervical back: Normal range of motion.  Neurological:     General: No focal deficit present.     Mental Status: She is alert. Mental status is at baseline.    ED Results / Procedures / Treatments   Labs (all labs ordered are listed, but only abnormal results are displayed) Labs Reviewed  LIPASE, BLOOD - Abnormal; Notable for the following components:      Result Value   Lipase 64 (*)    All other components within normal limits  CBC - Abnormal; Notable for the following components:   Hemoglobin 11.6 (*)    HCT 35.9 (*)    All other components within normal limits  COMPREHENSIVE METABOLIC PANEL  URINALYSIS, ROUTINE W REFLEX MICROSCOPIC    EKG None  Radiology CT Abdomen Pelvis W Contrast  Result Date: 01/25/2021 CLINICAL DATA:  Acute abdominal pain, lawn localized. Lower abdominal and bilateral flank pain. Nausea. EXAM: CT ABDOMEN AND PELVIS WITH CONTRAST TECHNIQUE: Multidetector CT imaging of the abdomen and pelvis was performed using the standard protocol  following bolus administration of intravenous contrast. CONTRAST:  66m OMNIPAQUE IOHEXOL 350 MG/ML SOLN COMPARISON:  01/04/2021 FINDINGS: Lower chest: Lung bases are clear. Trace pericardial effusion. Mild cardiac enlargement. Hepatobiliary: 8 mm low-attenuation lesion in the dome of the liver, not definitely present previously. This is too small for accurate characterization. Consider follow-up with elective MRI to exclude developing solid nodule. Gallbladder is surgically absent. No bile duct dilatation. Pancreas: Unremarkable. No pancreatic ductal dilatation or surrounding inflammatory changes. Spleen: Normal in size without focal abnormality. Adrenals/Urinary Tract: Left adrenal gland nodule measuring 2.6 cm diameter. No change since prior study. Subcentimeter right adrenal gland nodule is also unchanged. Bilateral renal parenchymal scarring. Nephrograms are otherwise homogeneous. Aberrant rotation of the kidneys. No hydronephrosis or hydroureter. Bladder is unremarkable. Stomach/Bowel: Stomach, Small bowel, and colon are not abnormally distended. No wall thickening or inflammatory changes are apparent. Scattered stool throughout the colon. Appendix is not identified. Vascular/Lymphatic: Calcification of the aorta with focal mural thrombus. No significant aneurysm. No significant lymphadenopathy. Reproductive: Uterus is surgically absent. No abnormal pelvic masses. Other: No free air or free fluid in the abdomen. Abdominal wall musculature appears intact. Musculoskeletal: No acute or significant osseous findings. IMPRESSION: 1.  8 mm low-attenuation in the dome of the liver, not definitely present previously. Two small fracture characterization. When the patient is clinically stable and able to follow directions and hold their breath (preferably as an outpatient) further evaluation with dedicated abdominal MRI should be considered. 2. No evidence of bowel obstruction or inflammation. 3. Aortic atherosclerosis.  Electronically Signed   By: Lucienne Capers M.D.   On: 01/25/2021 19:16    Procedures Procedures   Medications Ordered in ED Medications  acetaminophen (TYLENOL) tablet 650 mg (650 mg Oral Given 01/25/21 1933)  iohexol (OMNIPAQUE) 350 MG/ML injection 100 mL (80 mLs Intravenous Contrast Given 01/25/21 1853)    ED Course  I have reviewed the triage vital signs and the nursing notes.  Pertinent labs & imaging results that were available during my care of the patient were reviewed by me and considered in my medical decision making (see chart for details).    MDM Rules/Calculators/A&P                           Labs unremarkable white count normal chemistry normal urinalysis negative.  CT imaging shows incidental finding of a liver lesion, no other acute pathology noted.  Patient remains comfortable here in the ER with normal vitals.  Recommending outpatient follow-up for her liver lesion.  Advising immediate return for fevers worsening symptoms inability keep down any fluids or any additional concerns.  Final Clinical Impression(s) / ED Diagnoses Final diagnoses:  Lower abdominal pain  Liver lesion    Rx / DC Orders ED Discharge Orders     None        Luna Fuse, MD 01/25/21 1935

## 2021-01-25 NOTE — ED Triage Notes (Signed)
Pain in lower back and sides for over a week

## 2021-01-25 NOTE — Patient Instructions (Signed)
RESTART Farxiga 10 mg, one tab daily RESTART Spironolactone 12.5 mg, one half tab nightly at bedtime  Labs today We will only contact you if something comes back abnormal or we need to make some changes. Otherwise no news is good news!  Labs needed in 7 days  Your physician has requested that you have an echocardiogram. Echocardiography is a painless test that uses sound waves to create images of your heart. It provides your doctor with information about the size and shape of your heart and how well your heart's chambers and valves are working. This procedure takes approximately one hour. There are no restrictions for this procedure.  Your physician recommends that you schedule a follow-up appointment in: 2 weeks  in the Advanced Practitioners (PA/NP) Clinic    Do the following things EVERYDAY: Weigh yourself in the morning before breakfast. Write it down and keep it in a log. Take your medicines as prescribed Eat low salt foods--Limit salt (sodium) to 2000 mg per day.  Stay as active as you can everyday Limit all fluids for the day to less than 2 liters  At the [facility], you and your health needs are our priority. As part of our continuing mission to provide you with exceptional heart care, we have created designated Provider Care Teams. These Care Teams include your primary Cardiologist (physician) and Advanced Practice Providers (APPs- Physician Assistants and Nurse Practitioners) who all work together to provide you with the care you need, when you need it.   You may see any of the following providers on your designated Care Team at your next follow up: Dr Glori Bickers Dr Loralie Champagne Dr Patrice Paradise, NP Lyda Jester, [name] Ginnie Smart [name], PharmD   Please be sure to bring in all your medications bottles to every appointment.    If you have any questions or concerns before your next appointment please send [place] a message  through [place] or call our office at [phone].    TO LEAVE A MESSAGE FOR THE NURSE SELECT OPTION 2, PLEASE LEAVE A MESSAGE INCLUDING: YOUR NAME DATE OF BIRTH CALL BACK NUMBER REASON FOR CALL**this is important as we prioritize the call backs  YOU WILL RECEIVE A CALL BACK THE SAME DAY AS LONG AS YOU CALL BEFORE 4:00 PM

## 2021-02-01 ENCOUNTER — Ambulatory Visit (INDEPENDENT_AMBULATORY_CARE_PROVIDER_SITE_OTHER): Payer: 59

## 2021-02-01 DIAGNOSIS — Z95810 Presence of automatic (implantable) cardiac defibrillator: Secondary | ICD-10-CM

## 2021-02-01 DIAGNOSIS — I5042 Chronic combined systolic (congestive) and diastolic (congestive) heart failure: Secondary | ICD-10-CM

## 2021-02-01 NOTE — Progress Notes (Signed)
EPIC Encounter for ICM Monitoring  Patient Name: Erin [name] is a 60 y.o. female Date: 02/01/2021 Primary Care Physican: [name], MD Primary Cardiologist: Aundra Dubin Electrophysiologist: Curt Bears 01/25/2021 Office Weight: 117 lbs                                                           Spoke with patient and heart failure questions reviewed.  Pt asymptomatic for fluid accumulation.  She is feeling better after a recent hospitalization. She is changing her diet and eating healthier foods.   Optivol thoracic impedance suggesting possible fluid levels returned to normal after Lasix was resumed post hospitalization.   Prescribed: Furosemide 20 mg Take 1 tablets (20 mg total) by mouth daily.   Labs: 01/25/2021 Creatinine 0.93, BUN 14, Potassium 3.6, Sodium 139, GFR >60 01/06/2021 Creatinine 1.10, BUN 16, Potassium 4.1, Sodium 137, GFR 58 01/04/2021 Creatinine 1.37, BUN 16, Potassium 4.1, Sodium 140, GFR 45  01/03/2021 Creatinine 1.72, BUN 18, Potassium 3.1, Sodium 137, GFR 34  01/02/2021 Creatinine 1.00, BUN 17, Potassium 3.7, Sodium 136, GFR >60  12/22/2020 Creatinine 0.97, BUN 14, Potassium 3.6, Sodium 139, GFR >60  12/13/2020 Creatinine 0.84, BUN 16, Potassium 3.1, Sodium 138, GFR >60 12/10/2020 Creatinine 1.07, BUN 16, Potassium 3.7, Sodium 137, GFR >60  10/22/2020 Creatinine 1.00, BUN 11, Potassium 3.5, Sodium 139, GFR >60  10/21/2020 Creatinine 0.87, BUN 10, Potassium 3.8, Sodium 138, GFR >60  10/19/2020 Creatinine 1.22, BUN 21, Potassium 4.0, Sodium 136 10/07/2020 Creatinine 1.03, BUN 18, Potassium 4.4, Sodium 142, GFR 63  A complete set of results can be found in Results Review.   Recommendations: No changes and encouraged to call if experiencing any fluid symptoms.   Follow-up plan: ICM clinic phone appointment on 02/22/2021.   91 day device clinic remote transmission 04/05/2021.     EP/Cardiology Office Visits:  02/08/2021 with HF clinic PA/NP.   Copy of ICM check sent to  Dr. Curt Bears.   3 month ICM trend: 02/01/2021.    1 Year ICM trend:       Rosalene Billings, RN 02/01/2021 4:39 PM

## 2021-02-02 ENCOUNTER — Other Ambulatory Visit: Payer: Self-pay | Admitting: Cardiology

## 2021-02-03 LAB — BASIC METABOLIC PANEL
BUN/Creatinine Ratio: 17 (ref 9–23)
BUN: 21 mg/dL (ref 6–24)
CO2: 21 mmol/L (ref 20–29)
Calcium: 10.5 mg/dL — ABNORMAL HIGH (ref 8.7–10.2)
Chloride: 100 mmol/L (ref 96–106)
Creatinine, Ser: 1.24 mg/dL — ABNORMAL HIGH (ref 0.57–1.00)
Glucose: 89 mg/dL (ref 70–99)
Potassium: 4.4 mmol/L (ref 3.5–5.2)
Sodium: 137 mmol/L (ref 134–144)
eGFR: 50 mL/min/{1.73_m2} — ABNORMAL LOW (ref >59)

## 2021-02-03 LAB — SPECIMEN STATUS REPORT

## 2021-02-05 NOTE — Progress Notes (Deleted)
Error

## 2021-02-08 ENCOUNTER — Encounter (HOSPITAL_COMMUNITY): Payer: Self-pay

## 2021-02-08 ENCOUNTER — Other Ambulatory Visit: Payer: Self-pay

## 2021-02-08 ENCOUNTER — Ambulatory Visit (HOSPITAL_COMMUNITY)
Admission: RE | Admit: 2021-02-08 | Discharge: 2021-02-08 | Disposition: A | Discharge status: Home / Self Care | Payer: 59 | Source: Ambulatory Visit | Attending: Adult Health | Admitting: Adult Health

## 2021-02-08 VITALS — BP 130/70 | HR 102 | Wt 116.6 lb

## 2021-02-08 DIAGNOSIS — Z7902 Long term (current) use of antithrombotics/antiplatelets: Secondary | ICD-10-CM | POA: Insufficient documentation

## 2021-02-08 DIAGNOSIS — F17210 Nicotine dependence, cigarettes, uncomplicated: Secondary | ICD-10-CM | POA: Insufficient documentation

## 2021-02-08 DIAGNOSIS — K769 Liver disease, unspecified: Secondary | ICD-10-CM | POA: Insufficient documentation

## 2021-02-08 DIAGNOSIS — I255 Ischemic cardiomyopathy: Secondary | ICD-10-CM | POA: Diagnosis not present

## 2021-02-08 DIAGNOSIS — Z8249 Family history of ischemic heart disease and other diseases of the circulatory system: Secondary | ICD-10-CM | POA: Diagnosis not present

## 2021-02-08 DIAGNOSIS — I2510 Atherosclerotic heart disease of native coronary artery without angina pectoris: Secondary | ICD-10-CM | POA: Diagnosis not present

## 2021-02-08 DIAGNOSIS — Z955 Presence of coronary angioplasty implant and graft: Secondary | ICD-10-CM | POA: Insufficient documentation

## 2021-02-08 DIAGNOSIS — Z79899 Other long term (current) drug therapy: Secondary | ICD-10-CM | POA: Insufficient documentation

## 2021-02-08 DIAGNOSIS — I5022 Chronic systolic (congestive) heart failure: Secondary | ICD-10-CM | POA: Insufficient documentation

## 2021-02-08 DIAGNOSIS — Z7982 Long term (current) use of aspirin: Secondary | ICD-10-CM | POA: Diagnosis not present

## 2021-02-08 DIAGNOSIS — Z720 Tobacco use: Secondary | ICD-10-CM

## 2021-02-08 DIAGNOSIS — I252 Old myocardial infarction: Secondary | ICD-10-CM | POA: Diagnosis not present

## 2021-02-08 DIAGNOSIS — I5042 Chronic combined systolic (congestive) and diastolic (congestive) heart failure: Secondary | ICD-10-CM | POA: Diagnosis not present

## 2021-02-08 MED ORDER — CARVEDILOL 3.125 MG PO TABS: 3.1250 mg | ORAL_TABLET | Freq: Two times a day (BID) | ORAL | 1 refills | Status: DC

## 2021-02-08 MED ORDER — SPIRONOLACTONE 25 MG PO TABS: 25.0000 mg | ORAL_TABLET | Freq: Every day | ORAL | 3 refills | Status: DC

## 2021-02-08 MED ORDER — SPIRONOLACTONE 25 MG PO TABS: 25.0000 mg | ORAL_TABLET | Freq: Every day | ORAL | 1 refills | Status: DC

## 2021-02-08 NOTE — Patient Instructions (Signed)
Your physician recommends that you return for lab work in:7 days a [facility]  INCREASE Spirolactone to 25 mg 1 tablet daily  START Carvedilol 3.125 mg 1 tablet 2 times a day  At the [facility], you and your health needs are our priority. As part of our continuing mission to provide you with exceptional heart care, we have created designated Provider Care Teams. These Care Teams include your primary Cardiologist (physician) and Advanced Practice Providers (APPs- Physician Assistants and Nurse Practitioners) who all work together to provide you with the care you need, when you need it.   You may see any of the following providers on your designated Care Team at your next follow up: Dr Glori Bickers Dr Loralie Champagne Dr Patrice Paradise, NP Lyda Jester, [name] Ginnie Smart [name], PharmD   Please be sure to bring in all your medications bottles to every appointment.    If you have any questions or concerns before your next appointment please send [place] a message through [place] or call our office at [phone].    TO LEAVE A MESSAGE FOR THE NURSE SELECT OPTION 2, PLEASE LEAVE A MESSAGE INCLUDING: YOUR NAME DATE OF BIRTH CALL BACK NUMBER REASON FOR CALL**this is important as we prioritize the call backs  YOU WILL RECEIVE A CALL BACK THE SAME DAY AS LONG AS YOU CALL BEFORE 4:00 PM

## 2021-02-08 NOTE — Progress Notes (Signed)
ReDS Vest / Clip - 02/08/21 1400       ReDS Vest / Clip   Station Marker A    Ruler Value 24.5    ReDS Value Range Low volume    ReDS Actual Value 20

## 2021-02-08 NOTE — Progress Notes (Signed)
[facility] Progress Note   PCP: [name], MD HF Cardiology: [name]  Reason for Visit: Heart Failure   60 y.o. with history of CAD, ischemic cardiomyopathy, and PAD was referred by [name] for evaluation of CHF.  She had NSTEMI in 11/17 with PCI to prox/mid LCx and RCA.  Subsequently, has developed ischemic cardiomyopathy.  Most recent echo in 1/21 showed EF 30-35%.  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   RHC/LHC done with exertional chest heaviness and dyspnea in 4/21 showed normal filling pressures, preserved cardiac output, and nonobstructive mild CAD.  ABIs were normal in 4/21.   In 8/21, she had syncope thought to be related to orthostasis from low BP in the setting of cardiac medication titration.  Entresto was decreased.   She was in the ER in 11/21 with chest pain.  Troponin and COVID-19 negative. Echo in 11/21 showed EF 30-35%, normal RV.   She was hospitalized in 6/22 with left-sided weakness/numbness.  No evidence for acute CVA, ?TIA.  She is on ASA and Plavix.   Admitted 8/28-8/31/22 with hypotension and AKI 2/2 intractable nausea and vomiting. HF medications initially held. GI consulted and underwent  EGD which showed mild nonspecific distal gastritis with biopsies taken and alpha gal labs drawn. Her AKI resolved, SCr 1.7>>1.1. All HF meds, except for lasix, held on discharge due to low BP.   Last seen in the HF clinic 01/25/21. Arlyce Harman and farxiga restarted. Later that day she was having abdominal pain and went to the ED. CT abd with incidental liver lesion but no other acute findings. Recommendations to follow up with GI. Discharged the same day.    Today she returns for HF follow up.Overall feeling ok but trying to manage her stress. SOB with exertion with steps. SOB after walking 15 minutes.  Denies PND/Orthopnea. No chest pain. Appetite ok. No fever or chills. Weight at home has been stable.  Taking  all medications. Smoking 1 pack of cigarettes a month. Working as Psychologist, counselling at [employer]. She has 10 children that live at home.   Labs (2/21): K 3.7, creatinine 0.82 Labs (4/21): K 4.3, creatinine 0.81, LDL 67, TGs 286 Labs (6/21): K 4.7, creatinine 1.22 Labs (11/21): K 3.7, creatinine 0.84, hgb 12.6, LDL 67, HDL 48, TGs 286 Labs (1/22). K 4.4, creatinine 1.03 Labs (6/22): K 3.5, creatinine 1.0, LDL 7 Labs (8/22): K 4.1, creatinine 1.10  Labs (02/02/21): K 4.4 Creatinine 1.24   Medtronic device interrogation: System down today.   PMH: 1. CAD: NSTEMI with DES to proximal and mid LCx in 11/17, staged DES x 2 to RCA later in 11/17.  - Cardiolite (10/20): EF < 30%, large inferior and inferolateral MI with mild peri-infarct ischemia.  - LHC (4/21): Patent stents, nonobstructive CAD.  2. Chronic systolic CHF: Ischemic cardiomyopathy. Medtronic ICD.  - Echo (10/20): EF 35-40%, lateral WMAs, normal RV. - Echo (1/21): EF 30-35%, mild LVH, normal RV - RHC (4/21): mean RA 5, PA 29/3, mean PCWP 12, CI 3.04 - Echo (11/21): EF 30-35%, normal RV.  3. 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  4. Carotid stenosis: Carotid dopplers (20/94) with 70-96% LICA stenosis.  - Carotid dopplers (6/21): 40-59% BICA stenosis.  - Carotid dopplers (1/22): 28-36% RICA, 62-94% LICA.  5. Active smoker.  6. PAD: ABIs (4/21) Normal.  - Peripheral artery dopplers (12/21): bilateral plaque without focal stenosis.   Social History  Socioeconomic History   Marital status: Married    Spouse name: Not on file   Number of children: Not on file   Years of education: Not on file   Highest education level: Not on file  Occupational History   Occupation: CNA  Tobacco Use   Smoking status: Some Days    Packs/day: 0.10    Years: 15.00    Pack years: 1.50    Types: Cigarettes   Smokeless tobacco: Never   Tobacco comments:    smokes a cigarette occasionally  Vaping Use    Vaping Use: Never used  Substance and Sexual Activity   Alcohol use: Yes    Comment: occasionally   Drug use: Not Currently    Types: Marijuana    Comment: former- 2017 last time   Sexual activity: Not Currently    Birth control/protection: Surgical    Comment: hyst  Other Topics Concern   Not on file  Social History Narrative   Lives with husband in [place] in a one story home with a basement.  Has 4 children.  Works as a Quarry manager.  Education: CNA school.    Social Determinants of Health   Financial Resource Strain: Not on file  Food Insecurity: Not on file  Transportation Needs: Not on file  Physical Activity: Not on file  Stress: Not on file  Social Connections: Not on file  Intimate Partner Violence: Not on file   Family History  Problem Relation Age of Onset   Diabetes Father    Hypertension Father    CAD Father    Colon polyps Father 73       pre-cancerous    Stroke Father    Dementia Father    Stroke Mother    Hypertension Mother    Diabetes Mother    Heart failure Other    Breast cancer Maternal Grandmother    Colon cancer Neg Hx    ROS: All systems reviewed and negative except as per HPI.   Current Outpatient Medications  Medication Sig Dispense Refill   ALPRAZolam (XANAX) 1 MG tablet Take 1 mg by mouth See admin instructions. Take 1 mg by mouth at bedtime and an additional 1 mg daily as needed for anxiety     aspirin EC 81 MG tablet Take 81 mg by mouth every evening.      atorvastatin (LIPITOR) 80 MG tablet Take 1 tablet (80 mg total) by mouth daily at 6 PM. 90 tablet 3   Cholecalciferol (VITAMIN D-3 PO) Take 1 capsule by mouth daily with breakfast.     clopidogrel (PLAVIX) 75 MG tablet Take 1 tablet (75 mg total) by mouth daily. 30 tablet 3   dapagliflozin propanediol (FARXIGA) 10 MG TABS tablet Take 1 tablet (10 mg total) by mouth daily before breakfast. 30 tablet 11   ezetimibe (ZETIA) 10 MG tablet Take 1 tablet (10 mg total) by mouth daily. 30 tablet 6    furosemide (LASIX) 20 MG tablet Take 1 tablet (20 mg total) by mouth daily. 90 tablet 3   gabapentin (NEURONTIN) 100 MG capsule Take 200 mg by mouth at bedtime.     loperamide (IMODIUM) 2 MG capsule Take 2 mg by mouth 4 (four) times daily as needed for diarrhea or loose stools (ibs).      Multiple Vitamins-Minerals (MULTIVITAMIN WITH MINERALS) tablet Take 1 tablet by mouth daily.     nitroGLYCERIN (NITROSTAT) 0.4 MG SL tablet Place 1 tablet (0.4 mg total) under the tongue every 5 (five)  minutes x 3 doses as needed for chest pain (if no relief after 2nd dose, proceed to the ED for an evaluation or call 911). 25 tablet 2   omeprazole (PRILOSEC) 20 MG capsule Take 1 capsule (20 mg total) by mouth daily. 30 capsule 0   rOPINIRole (REQUIP) 0.5 MG tablet Take 0.5 mg by mouth at bedtime.   0   spironolactone (ALDACTONE) 25 MG tablet Take 0.5 tablets (12.5 mg total) by mouth at bedtime. 45 tablet 3   zinc gluconate 50 MG tablet Take 50 mg by mouth daily.     No current facility-administered medications for this encounter.   BP 130/70   Pulse (!) 102   Wt 52.9 kg (116 lb 9.6 oz)   SpO2 99%   BMI 22.77 kg/m  Wt Readings from Last 3 Encounters:  02/08/21 52.9 kg (116 lb 9.6 oz)  01/25/21 53.1 kg (117 lb)  01/25/21 53.3 kg (117 lb 9.6 oz)   Reds Clip 20%  PHYSICAL EXAM: General:  Thin. No resp difficulty HEENT: normal Neck: supple. no JVD. Carotids 2+ bilat; no bruits. No lymphadenopathy or thryomegaly appreciated. Cor: PMI nondisplaced. Regular rate & rhythm. No rubs, gallops or murmurs. Lungs: clear Abdomen: soft, nontender, nondistended. No hepatosplenomegaly. No bruits or masses. Good bowel sounds. Extremities: no cyanosis, clubbing, rash, edema Neuro: alert & orientedx3, cranial nerves grossly intact. moves all 4 extremities w/o difficulty. Affect pleasant  EKG: SR 98 bom    Assessment/Plan: 1. Chronic Systolic CHF: Ischemic cardiomyopathy. Echo in 11/21 showed stable EF 30-35%.  RHC  in 4/21 with normal filling pressures and preserved cardiac output.  GDMT held during recent admission given AKI (resolved) and hypotension.  - Repeat ECHO next visit.  - NYHA III. Reds Clip 20%. Not suggestive of fluid accumulation.  - Continue Lasix 20 mg daily  - Continue  Farxiga 10 mg daily  - Increase spiro to 25 mg daily. BMET from 02/02/21 stable. Check BMET in 7 days.  - Add coreg 3.125 mg twice a day.  - Continue to [employer] for now . Consider at next visit.  - She will continue to monitor BP at home.   2. CAD: S/p NSTEMI in 11/17 with DEX to LCx and DES x 2 to RCA.  Cardiolite in 10/20 with EF <30%, inferior/inferolateral infarct with peri-infarct ischemia. LHC in 4/21 showed nonobstructive mild CAD.   - No chest pain.  - Continue atorvastatin and Zetia. Good LDL 6/22 - Continue ASA 81 and Plavix 75 mg daily for now.  She will likely be a good candidate to replace Plavix with rivaroxaban 2.5 mg bid in the future. She will continue Plavix as long as Dr. Estanislado Pandy feels it is needed post her cranial circulation intervention.  3. Carotid stenosis: Repeat carotid dopplers in 1/23.  4. Smoking: Discussed smoking cessation.  5. 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  - Continue Plavix per Dr. Estanislado Pandy.  6. Liver Lesion  Note on CT abd 01/25/21  She has GI follow up.    Follow up next month with and ECHO and  Dr Aundra Dubin.  Greater than 50% of the (total minutes 25) visit spent in counseling/coordination of care regarding the above. Discussed medication changes and plan with her.    Darrick Grinder, NP-C  02/08/2021

## 2021-02-15 ENCOUNTER — Other Ambulatory Visit: Payer: Self-pay | Admitting: Cardiology

## 2021-02-16 LAB — BASIC METABOLIC PANEL
BUN/Creatinine Ratio: 20 (ref 9–23)
BUN: 26 mg/dL — ABNORMAL HIGH (ref 6–24)
CO2: 22 mmol/L (ref 20–29)
Calcium: 9.9 mg/dL (ref 8.7–10.2)
Chloride: 101 mmol/L (ref 96–106)
Creatinine, Ser: 1.32 mg/dL — ABNORMAL HIGH (ref 0.57–1.00)
Glucose: 95 mg/dL (ref 70–99)
Potassium: 4.2 mmol/L (ref 3.5–5.2)
Sodium: 139 mmol/L (ref 134–144)
eGFR: 47 mL/min/{1.73_m2} — ABNORMAL LOW (ref >59)

## 2021-02-16 LAB — SPECIMEN STATUS REPORT

## 2021-02-18 ENCOUNTER — Observation Stay (HOSPITAL_COMMUNITY)
Admission: EM | Admit: 2021-02-18 | Discharge: 2021-02-22 | Disposition: A | Discharge status: Home / Self Care | Payer: 59 | Attending: Internal Medicine | Admitting: Internal Medicine

## 2021-02-18 ENCOUNTER — Encounter (HOSPITAL_COMMUNITY): Payer: Self-pay | Admitting: *Deleted

## 2021-02-18 ENCOUNTER — Emergency Department (HOSPITAL_COMMUNITY): Payer: 59

## 2021-02-18 ENCOUNTER — Other Ambulatory Visit: Payer: Self-pay

## 2021-02-18 ENCOUNTER — Other Ambulatory Visit (HOSPITAL_COMMUNITY): Payer: Self-pay | Admitting: Cardiology

## 2021-02-18 DIAGNOSIS — Z7982 Long term (current) use of aspirin: Secondary | ICD-10-CM | POA: Insufficient documentation

## 2021-02-18 DIAGNOSIS — Z7902 Long term (current) use of antithrombotics/antiplatelets: Secondary | ICD-10-CM | POA: Insufficient documentation

## 2021-02-18 DIAGNOSIS — I110 Hypertensive heart disease with heart failure: Secondary | ICD-10-CM | POA: Insufficient documentation

## 2021-02-18 DIAGNOSIS — F17210 Nicotine dependence, cigarettes, uncomplicated: Secondary | ICD-10-CM | POA: Diagnosis not present

## 2021-02-18 DIAGNOSIS — I255 Ischemic cardiomyopathy: Secondary | ICD-10-CM | POA: Diagnosis not present

## 2021-02-18 DIAGNOSIS — R519 Headache, unspecified: Secondary | ICD-10-CM

## 2021-02-18 DIAGNOSIS — I5042 Chronic combined systolic (congestive) and diastolic (congestive) heart failure: Secondary | ICD-10-CM | POA: Diagnosis not present

## 2021-02-18 DIAGNOSIS — K529 Noninfective gastroenteritis and colitis, unspecified: Secondary | ICD-10-CM

## 2021-02-18 DIAGNOSIS — Z85828 Personal history of other malignant neoplasm of skin: Secondary | ICD-10-CM | POA: Insufficient documentation

## 2021-02-18 DIAGNOSIS — I10 Essential (primary) hypertension: Secondary | ICD-10-CM | POA: Diagnosis present

## 2021-02-18 DIAGNOSIS — I959 Hypotension, unspecified: Principal | ICD-10-CM | POA: Insufficient documentation

## 2021-02-18 DIAGNOSIS — R109 Unspecified abdominal pain: Secondary | ICD-10-CM | POA: Diagnosis not present

## 2021-02-18 DIAGNOSIS — R112 Nausea with vomiting, unspecified: Secondary | ICD-10-CM | POA: Diagnosis not present

## 2021-02-18 DIAGNOSIS — I2510 Atherosclerotic heart disease of native coronary artery without angina pectoris: Secondary | ICD-10-CM | POA: Diagnosis not present

## 2021-02-18 DIAGNOSIS — Z955 Presence of coronary angioplasty implant and graft: Secondary | ICD-10-CM | POA: Insufficient documentation

## 2021-02-18 DIAGNOSIS — Z20822 Contact with and (suspected) exposure to covid-19: Secondary | ICD-10-CM | POA: Diagnosis not present

## 2021-02-18 DIAGNOSIS — R0602 Shortness of breath: Secondary | ICD-10-CM

## 2021-02-18 DIAGNOSIS — R0789 Other chest pain: Secondary | ICD-10-CM

## 2021-02-18 DIAGNOSIS — E860 Dehydration: Secondary | ICD-10-CM | POA: Diagnosis not present

## 2021-02-18 DIAGNOSIS — Z79899 Other long term (current) drug therapy: Secondary | ICD-10-CM | POA: Diagnosis not present

## 2021-02-18 DIAGNOSIS — R197 Diarrhea, unspecified: Secondary | ICD-10-CM

## 2021-02-18 LAB — CBC
HCT: 39.6 % (ref 36.0–46.0)
Hemoglobin: 13.1 g/dL (ref 12.0–15.0)
MCH: 28.9 pg (ref 26.0–34.0)
MCHC: 33.1 g/dL (ref 30.0–36.0)
MCV: 87.2 fL (ref 80.0–100.0)
Platelets: 300 10*3/uL (ref 150–400)
RBC: 4.54 MIL/uL (ref 3.87–5.11)
RDW: 14.2 % (ref 11.5–15.5)
WBC: 9.9 10*3/uL (ref 4.0–10.5)
nRBC: 0 % (ref 0.0–0.2)

## 2021-02-18 LAB — URINALYSIS, ROUTINE W REFLEX MICROSCOPIC
Bacteria, UA: NONE SEEN
Bilirubin Urine: NEGATIVE
Glucose, UA: >=500 mg/dL — AB
Hgb urine dipstick: NEGATIVE
Ketones, ur: 5 mg/dL — AB
Leukocytes,Ua: NEGATIVE
Nitrite: NEGATIVE
Protein, ur: NEGATIVE mg/dL
Specific Gravity, Urine: 1.022 (ref 1.005–1.030)
pH: 6 (ref 5.0–8.0)

## 2021-02-18 LAB — RESP PANEL BY RT-PCR (FLU A&B, COVID) ARPGX2
Influenza A by PCR: NEGATIVE
Influenza B by PCR: NEGATIVE
SARS Coronavirus 2 by RT PCR: NEGATIVE

## 2021-02-18 LAB — HEPATIC FUNCTION PANEL
ALT: 18 U/L (ref 0–44)
AST: 22 U/L (ref 15–41)
Albumin: 3.8 g/dL (ref 3.5–5.0)
Alkaline Phosphatase: 68 U/L (ref 38–126)
Bilirubin, Direct: 0.1 mg/dL (ref 0.0–0.2)
Indirect Bilirubin: 1 mg/dL — ABNORMAL HIGH (ref 0.3–0.9)
Total Bilirubin: 1.1 mg/dL (ref 0.3–1.2)
Total Protein: 6.7 g/dL (ref 6.5–8.1)

## 2021-02-18 LAB — BASIC METABOLIC PANEL
Anion gap: 10 (ref 5–15)
BUN: 21 mg/dL — ABNORMAL HIGH (ref 6–20)
CO2: 24 mmol/L (ref 22–32)
Calcium: 9 mg/dL (ref 8.9–10.3)
Chloride: 101 mmol/L (ref 98–111)
Creatinine, Ser: 1.05 mg/dL — ABNORMAL HIGH (ref 0.44–1.00)
GFR, Estimated: >60 mL/min (ref >60)
Glucose, Bld: 116 mg/dL — ABNORMAL HIGH (ref 70–99)
Potassium: 3.2 mmol/L — ABNORMAL LOW (ref 3.5–5.1)
Sodium: 135 mmol/L (ref 135–145)

## 2021-02-18 LAB — LACTIC ACID, PLASMA: Lactic Acid, Venous: 1.4 mmol/L (ref 0.5–1.9)

## 2021-02-18 LAB — TROPONIN I (HIGH SENSITIVITY)
Troponin I (High Sensitivity): 8 ng/L (ref <18)
Troponin I (High Sensitivity): 9 ng/L (ref <18)

## 2021-02-18 LAB — LIPASE, BLOOD: Lipase: 67 U/L — ABNORMAL HIGH (ref 11–51)

## 2021-02-18 LAB — CORTISOL: Cortisol, Plasma: 43.4 ug/dL

## 2021-02-18 LAB — MAGNESIUM: Magnesium: 2.2 mg/dL (ref 1.7–2.4)

## 2021-02-18 MED ORDER — CLOPIDOGREL BISULFATE 75 MG PO TABS
75.0000 mg | ORAL_TABLET | Freq: Every day | ORAL | Status: DC
  Administered 2021-02-18 – 2021-02-22 (×5): 75 mg via ORAL
  Filled 2021-02-18 (×5): qty 1

## 2021-02-18 MED ORDER — ALPRAZOLAM 1 MG PO TABS: 1.0000 mg | ORAL_TABLET | Freq: Every day | ORAL | Status: DC | PRN

## 2021-02-18 MED ORDER — ATORVASTATIN CALCIUM 40 MG PO TABS
80.0000 mg | ORAL_TABLET | Freq: Every day | ORAL | Status: DC
  Administered 2021-02-19 – 2021-02-21 (×3): 80 mg via ORAL
  Filled 2021-02-18 (×3): qty 2

## 2021-02-18 MED ORDER — ALPRAZOLAM 1 MG PO TABS: 1.0000 mg | ORAL_TABLET | ORAL | Status: DC

## 2021-02-18 MED ORDER — PANTOPRAZOLE SODIUM 40 MG PO TBEC
40.0000 mg | DELAYED_RELEASE_TABLET | Freq: Every day | ORAL | Status: DC
  Administered 2021-02-18 – 2021-02-22 (×5): 40 mg via ORAL
  Filled 2021-02-18 (×5): qty 1

## 2021-02-18 MED ORDER — ONDANSETRON HCL 4 MG/2ML IJ SOLN
4.0000 mg | Freq: Once | INTRAMUSCULAR | Status: AC
  Administered 2021-02-18: 4 mg via INTRAVENOUS
  Filled 2021-02-18: qty 2

## 2021-02-18 MED ORDER — SODIUM CHLORIDE 0.9 % IV BOLUS
1000.0000 mL | Freq: Once | INTRAVENOUS | Status: AC
  Administered 2021-02-18: 1000 mL via INTRAVENOUS

## 2021-02-18 MED ORDER — ROPINIROLE HCL 0.25 MG PO TABS
0.5000 mg | ORAL_TABLET | Freq: Every day | ORAL | Status: DC
  Administered 2021-02-18 – 2021-02-21 (×4): 0.5 mg via ORAL
  Filled 2021-02-18 (×4): qty 2

## 2021-02-18 MED ORDER — POTASSIUM CHLORIDE IN NACL 40-0.9 MEQ/L-% IV SOLN
INTRAVENOUS | Status: DC
  Filled 2021-02-18: qty 1000

## 2021-02-18 MED ORDER — ALPRAZOLAM 1 MG PO TABS
1.0000 mg | ORAL_TABLET | Freq: Every day | ORAL | Status: DC
  Administered 2021-02-18 – 2021-02-21 (×4): 1 mg via ORAL
  Filled 2021-02-18 (×5): qty 1

## 2021-02-18 MED ORDER — ENOXAPARIN SODIUM 40 MG/0.4ML IJ SOSY
40.0000 mg | PREFILLED_SYRINGE | INTRAMUSCULAR | Status: DC
  Administered 2021-02-19 – 2021-02-21 (×2): 40 mg via SUBCUTANEOUS
  Filled 2021-02-18 (×3): qty 0.4

## 2021-02-18 MED ORDER — NICOTINE 21 MG/24HR TD PT24
21.0000 mg | MEDICATED_PATCH | Freq: Every day | TRANSDERMAL | Status: DC
  Administered 2021-02-19 – 2021-02-22 (×4): 21 mg via TRANSDERMAL
  Filled 2021-02-18 (×4): qty 1

## 2021-02-18 MED ORDER — LOPERAMIDE HCL 2 MG PO CAPS: 2.0000 mg | ORAL_CAPSULE | Freq: Four times a day (QID) | ORAL | Status: DC | PRN

## 2021-02-18 MED ORDER — ASPIRIN EC 81 MG PO TBEC
81.0000 mg | DELAYED_RELEASE_TABLET | Freq: Every evening | ORAL | Status: DC
  Administered 2021-02-18 – 2021-02-21 (×4): 81 mg via ORAL
  Filled 2021-02-18 (×4): qty 1

## 2021-02-18 MED ORDER — IOHEXOL 350 MG/ML SOLN
100.0000 mL | Freq: Once | INTRAVENOUS | Status: AC | PRN
  Administered 2021-02-18: 100 mL via INTRAVENOUS

## 2021-02-18 MED ORDER — DAPAGLIFLOZIN PROPANEDIOL 5 MG PO TABS
10.0000 mg | ORAL_TABLET | Freq: Every day | ORAL | Status: DC
  Administered 2021-02-19 – 2021-02-22 (×4): 10 mg via ORAL
  Filled 2021-02-18 (×2): qty 2
  Filled 2021-02-18 (×5): qty 1

## 2021-02-18 NOTE — H&P (Addendum)
TRH H&P   Patient Demographics:    Erin [name], is a 60 y.o. female  MRN: [id number]   DOB - 07/10/1960  Admit Date - 02/18/2021  Outpatient Primary MD for the patient is [name], MD  Referring MD/NP/PA: Dr Gilford Raid  Outpatient Specialists: Dr Gala Romney    Patient coming from: home  Chief Complaint  Patient presents with   Hypotension      HPI:    Erin [name]  is a 60 y.o. female,  with PMH significant for ischemic cardiomyopathy with systolic and diastolic congestive heart failure (Echo 03/2020 EF 30-35% with G2DD), coronary artery disease (S/P DES to proximal Lcx and PDA, last cath 08/2019 revealing patent stents and minimal disease), lest posterior communicating artery brain aneurysm (S/P embolization 04/2019),  gastroesophageal reflux disease, nicotine dependence, generalized anxiety disorder, hyperlipidemia. -Patient presents to ED secondary to complaints of nausea, vomiting and diarrhea, as well headache, reports she woke up this morning with the symptoms, any complaints yesterday, reports she did take her oral medications this morning, including antihypertensive/cardiac medications, she does report some diarrhea as well, patient with recent hospitalization at [facility] in late August for similar complaints, where she had endoscopy significant for gastritis, as well her CT abdomen was significant for liver lesions of unknown etiology. -In ED she was hypotensive, systolic blood pressure in the 70s, she received total of 2 L fluid bolus, where her blood pressure has improved, CT abdomen pelvis was significant for liver lesions, with no significant change in size, creatinine at baseline of 1.05, potassium low at 3.2, hemoglobin stable at 13.1, lactic acid within normal range at 1.4, [facility] consulted to admit.   Review of systems:    In addition to the HPI  above, No Fever-chills, does report generalized weakness, and weight loss No Headache, No changes with Vision or hearing, No problems swallowing food or Liquids, No Chest pain, Cough or Shortness of Breath, Does report nausea, vomiting and diarrhea No Blood in stool or Urine, No dysuria, No new skin rashes or bruises, No new joints pains-aches,  No new weakness, tingling, numbness in any extremity, No recent weight gain or loss, No polyuria, polydypsia or polyphagia, No significant Mental Stressors.  A full 10 point Review of Systems was done, except as stated above, all other Review of Systems were negative.   With Past History of the following :    Past Medical History:  Diagnosis Date   Anxiety state 03/25/2016   Basal cell carcinoma of forehead    Brain aneurysm    CHF (congestive heart failure) ([place])    Coronary artery disease    a. 03/11/16 PCI with DES-->Prox/Mid Cx;  b. 03/14/16 PCI with DES x2-->RCA, EF 30-35%.   Essential hypertension    GERD (gastroesophageal reflux disease)    HFrEF (heart failure with reduced ejection fraction) ([place])    a. 10/2016 Echo:  EF 35-40%, Gr1 DD, mild focal basal septal hypertrophy, basal inflat, mid inflat, basal antlat AK. Mid infept/inf/antlat, apical lateral sev HK. Mod MR. mildly reduced RV fxn. Mild TR.   History of pneumonia    Hyperlipidemia    IBS (irritable bowel syndrome)    Ischemic cardiomyopathy    a. 10/2016 Echo: EF 35-40%, Gr1 DD.   Mitral regurgitation    NSTEMI (non-ST elevated myocardial infarction) ([place]) 03/10/2016   Pneumonia 03/2016   Squamous cell cancer of skin of nose    Thrombocytosis 03/26/2016   Tobacco abuse    Trichimoniasis    Wears dentures    Wears glasses       Past Surgical History:  Procedure Laterality Date   APPENDECTOMY     BIOPSY  09/20/2018   Procedure: BIOPSY;  Surgeon: Daneil Dolin, MD;  [facility];  Service: Endoscopy;;  colon   BIOPSY  01/05/2021   Procedure: BIOPSY;   Surgeon: Milus Banister, MD;  Location: [facility] ENDOSCOPY;  Service: Endoscopy;;   CARDIAC CATHETERIZATION N/A 03/11/2016   Procedure: Left Heart Cath and Coronary Angiography;  Surgeon: Leonie Man, MD;  Location: [place] [facility] LAB;  Service: Cardiovascular;  Laterality: N/A;   CARDIAC CATHETERIZATION N/A 03/11/2016   Procedure: Coronary Stent Intervention;  Surgeon: Leonie Man, MD;  Location: [place] [facility];  Service: Cardiovascular;  Laterality: N/A;   CARDIAC CATHETERIZATION N/A 03/14/2016   Procedure: Coronary Stent Intervention;  Surgeon: Peter M Martinique, MD;  Location: [place] [facility];  Service: Cardiovascular;  Laterality: N/A;   CHOLECYSTECTOMY OPEN  1984   COLONOSCOPY WITH PROPOFOL N/A 09/20/2018   Procedure: COLONOSCOPY WITH PROPOFOL;  Surgeon: Daneil Dolin, MD;  [facility];  Service: Endoscopy;  Laterality: N/A;  10:30am   CORONARY ANGIOPLASTY WITH STENT PLACEMENT  03/14/2016   ESOPHAGOGASTRODUODENOSCOPY (EGD) WITH PROPOFOL N/A 09/20/2018   Procedure: ESOPHAGOGASTRODUODENOSCOPY (EGD) WITH PROPOFOL;  Surgeon: Daneil Dolin, MD;  [facility];  Service: Endoscopy;  Laterality: N/A;   ESOPHAGOGASTRODUODENOSCOPY (EGD) WITH PROPOFOL N/A 01/05/2021   Procedure: ESOPHAGOGASTRODUODENOSCOPY (EGD) WITH PROPOFOL;  Surgeon: Milus Banister, MD;  Location: [facility] ENDOSCOPY;  Service: Endoscopy;  Laterality: N/A;   FINGER ARTHROPLASTY Left 05/14/2013   Procedure: LEFT THUMB CARPAL METACARPAL ARTHROPLASTY;  Surgeon: Tennis Must, MD;  Location: [place];  Service: Orthopedics;  Laterality: Left;   ICD IMPLANT N/A 04/03/2020   Procedure: ICD IMPLANT;  Surgeon: Constance Haw, MD;  Location: [place] [facility];  Service: Cardiovascular;  Laterality: N/A;   IR ANGIO INTRA EXTRACRAN SEL COM CAROTID INNOMINATE BILAT MOD SED  01/05/2017   IR ANGIO INTRA EXTRACRAN SEL COM CAROTID INNOMINATE BILAT MOD SED  03/19/2019   IR ANGIO INTRA EXTRACRAN SEL COM  CAROTID INNOMINATE BILAT MOD SED  06/04/2020   IR ANGIO INTRA EXTRACRAN SEL INTERNAL CAROTID UNI L MOD SED  04/15/2019   IR ANGIO VERTEBRAL SEL VERTEBRAL BILAT MOD SED  01/05/2017   IR ANGIO VERTEBRAL SEL VERTEBRAL BILAT MOD SED  03/19/2019   IR ANGIO VERTEBRAL SEL VERTEBRAL UNI L MOD SED  06/04/2020   IR ANGIOGRAM FOLLOW UP STUDY  04/15/2019   IR RADIOLOGIST EVAL & MGMT  12/30/2016   IR TRANSCATH/EMBOLIZ  04/15/2019   IR US GUIDE VASC ACCESS RIGHT  03/19/2019   IR US GUIDE VASC ACCESS RIGHT  06/04/2020   MALONEY DILATION N/A 09/20/2018   Procedure: Venia Minks DILATION;  Surgeon: Daneil Dolin, MD;  [facility];  Service: Endoscopy;  Laterality: N/A;   RADIOLOGY WITH ANESTHESIA N/A 04/15/2019   Procedure: [employer];  Surgeon: Luanne Bras, MD;  Location: [place];  Service: Radiology;  Laterality: N/A;   RIGHT/LEFT HEART CATH AND CORONARY ANGIOGRAPHY N/A 08/19/2019   Procedure: RIGHT/LEFT HEART CATH AND CORONARY ANGIOGRAPHY;  Surgeon: Larey Dresser, MD;  Location: [place] [facility];  Service: Cardiovascular;  Laterality: N/A;   TUBAL LIGATION  1987   VAGINAL HYSTERECTOMY  2009      Social History:     Social History   Tobacco Use   Smoking status: Some Days    Packs/day: 0.10    Years: 15.00    Pack years: 1.50    Types: Cigarettes   Smokeless tobacco: Never   Tobacco comments:    smokes a cigarette occasionally  Substance Use Topics   Alcohol use: Yes    Comment: occasionally       Family History :     Family History  Problem Relation Age of Onset   Diabetes Father    Hypertension Father    CAD Father    Colon polyps Father 70       pre-cancerous    Stroke Father    Dementia Father    Stroke Mother    Hypertension Mother    Diabetes Mother    Heart failure Other    Breast cancer Maternal Grandmother    Colon cancer Neg Hx       Home Medications:   Prior to Admission medications   Medication Sig Start Date End Date Taking? Authorizing Provider   ALPRAZolam Duanne Moron) 1 MG tablet Take 1 mg by mouth See admin instructions. Take 1 mg by mouth at bedtime and an additional 1 mg daily as needed for anxiety   Yes [provider]  aspirin EC 81 MG tablet Take 81 mg by mouth every evening.    Yes [provider]  atorvastatin (LIPITOR) 80 MG tablet Take 1 tablet (80 mg total) by mouth daily at 6 PM. 11/10/20  Yes Larey Dresser, MD  carvedilol (COREG) 3.125 MG tablet Take 1 tablet (3.125 mg total) by mouth 2 (two) times daily. 02/08/21 05/09/21 Yes Clegg, Amy D, NP  Cholecalciferol (VITAMIN D-3 PO) Take 1 capsule by mouth daily with breakfast.   Yes [provider]  clopidogrel (PLAVIX) 75 MG tablet Take 1 tablet (75 mg total) by mouth daily. 10/27/20  Yes Louk, Alexandra M, PA-C  dapagliflozin propanediol (FARXIGA) 10 MG TABS tablet Take 1 tablet (10 mg total) by mouth daily before breakfast. 01/25/21  Yes Milford, Maricela Bo, FNP  ezetimibe (ZETIA) 10 MG tablet Take 1 tablet (10 mg total) by mouth daily. 07/07/20  Yes Larey Dresser, MD  furosemide (LASIX) 20 MG tablet Take 1 tablet (20 mg total) by mouth daily. 01/08/21 04/08/21 Yes Larey Dresser, MD  gabapentin (NEURONTIN) 100 MG capsule Take 200 mg by mouth at bedtime.   Yes [provider]  loperamide (IMODIUM) 2 MG capsule Take 2 mg by mouth 4 (four) times daily as needed for diarrhea or loose stools (ibs).    Yes [provider]  Multiple Vitamins-Minerals (MULTIVITAMIN WITH MINERALS) tablet Take 1 tablet by mouth daily.   Yes [provider]  nicotine (NICODERM CQ - DOSED IN MG/24 HOURS) 21 mg/24hr patch Place 21 mg onto the skin daily.   Yes [provider]  nitroGLYCERIN (NITROSTAT) 0.4 MG SL tablet Place 1 tablet (  0.4 mg total) under the tongue every 5 (five) minutes x 3 doses as needed for chest pain (if no relief after 2nd dose, proceed to the ED for an evaluation or call 911). 09/01/20  Yes Verta Ellen., NP  omeprazole  (PRILOSEC) 20 MG capsule Take 1 capsule (20 mg total) by mouth daily. 01/06/21 02/08/22 Yes Dahal, Marlowe Aschoff, MD  rOPINIRole (REQUIP) 0.5 MG tablet Take 0.5 mg by mouth at bedtime.  11/13/17  Yes [provider]  spironolactone (ALDACTONE) 25 MG tablet Take 1 tablet (25 mg total) by mouth at bedtime. 02/08/21 05/09/21 Yes Clegg, Amy D, NP  zinc gluconate 50 MG tablet Take 50 mg by mouth daily.   Yes [provider]     Allergies:     Allergies  Allergen Reactions   Tape Other (See Comments)    PEELS SKIN OFF  (PAPER TAPE IS FINE)     Physical Exam:   Vitals  Blood pressure 105/74, pulse 84, temperature (!) 97 F (36.1 C), resp. rate 14, SpO2 100 %.   1. General frail, chronically appearing female, appears older than her stated age, laying in bed in no apparent distress.  2. Normal affect and insight, Not Suicidal or Homicidal, Awake Alert, Oriented X 3.  3. No F.N deficits, ALL C.Nerves Intact, Strength 5/5 all 4 extremities, Sensation intact all 4 extremities, Plantars down going.  4. Ears and Eyes appear Normal, Conjunctivae clear, PERRLA. Moist Oral Mucosa.  5. Supple Neck, No JVD, No cervical lymphadenopathy appriciated, No Carotid Bruits.  6. Symmetrical Chest wall movement, Good air movement bilaterally, CTAB.  7. RRR, No Gallops, Rubs or Murmurs, No Parasternal Heave.  8. Positive Bowel Sounds, Abdomen Soft, No tenderness, No organomegaly appriciated,No rebound -guarding or rigidity.  9.  No Cyanosis, Normal Skin Turgor, No Skin Rash or Bruise.  10. Good muscle tone,  joints appear normal , no effusions, Normal ROM.  11. No Palpable Lymph Nodes in Neck or Axillae    Data Review:    CBC Recent Labs  Lab 02/18/21 1118  WBC 9.9  HGB 13.1  HCT 39.6  PLT 300  MCV 87.2  MCH 28.9  MCHC 33.1  RDW 14.2   ------------------------------------------------------------------------------------------------------------------  Chemistries  Recent Labs   Lab 02/15/21 0825 02/18/21 1118 02/18/21 1119 02/18/21 1217  NA 139 135  --   --   K 4.2 3.2*  --   --   CL 101 101  --   --   CO2 22 24  --   --   GLUCOSE 95 116*  --   --   BUN 26* 21*  --   --   CREATININE 1.32* 1.05*  --   --   CALCIUM 9.9 9.0  --   --   MG  --   --   --  2.2  AST  --   --  22  --   ALT  --   --  18  --   ALKPHOS  --   --  68  --   BILITOT  --   --  1.1  --    ------------------------------------------------------------------------------------------------------------------ estimated creatinine clearance is 41.9 mL/min (A) (by C-G formula based on SCr of 1.05 mg/dL (H)). ------------------------------------------------------------------------------------------------------------------ No results for input(s): TSH, T4TOTAL, T3FREE, THYROIDAB in the last 72 hours.  Invalid input(s): FREET3  Coagulation profile No results for input(s): INR, PROTIME in the last 168 hours. ------------------------------------------------------------------------------------------------------------------- No results for input(s): DDIMER in the last 72  hours. -------------------------------------------------------------------------------------------------------------------  Cardiac Enzymes No results for input(s): CKMB, TROPONINI, MYOGLOBIN in the last 168 hours.  Invalid input(s): CK ------------------------------------------------------------------------------------------------------------------    Component Value Date/Time   BNP 123.1 (H) 01/25/2021 1300     ---------------------------------------------------------------------------------------------------------------  Urinalysis    Component Value Date/Time   COLORURINE YELLOW 01/25/2021 1848   APPEARANCEUR CLEAR 01/25/2021 1848   LABSPEC 1.011 01/25/2021 1848   PHURINE 7.0 01/25/2021 1848   GLUCOSEU NEGATIVE 01/25/2021 1848   HGBUR NEGATIVE 01/25/2021 1848   BILIRUBINUR NEGATIVE 01/25/2021 1848   KETONESUR  NEGATIVE 01/25/2021 1848   PROTEINUR NEGATIVE 01/25/2021 1848   NITRITE NEGATIVE 01/25/2021 1848   LEUKOCYTESUR NEGATIVE 01/25/2021 1848    ----------------------------------------------------------------------------------------------------------------   Imaging Results:    CT Angio Head W or Wo Contrast  Result Date: 02/18/2021 CLINICAL DATA:  Neuro deficit, stroke suspected EXAM: CT ANGIOGRAPHY HEAD TECHNIQUE: Multidetector CT imaging of the head was performed using the standard protocol during bolus administration of intravenous contrast. Multiplanar CT image reconstructions and MIPs were obtained to evaluate the vascular anatomy. CONTRAST:  150mL OMNIPAQUE IOHEXOL 350 MG/ML SOLN COMPARISON:  12/10/2020 FINDINGS: CT HEAD Brain: Stable left posterior parietal remote infarct. No acute infarct, hemorrhage, hydrocephalus, extra-axial collection, mass, mass effect, or midline shift. Vascular: No hyperdense vessel.  See CTA findings. Skull: Normal. Negative for fracture or focal lesion. Sinuses: No acute or significant finding. Other: None. CTA HEAD Anterior circulation: Redemonstrated distal left ICA stent, which appears patent. No residual aneurysm seen. Both internal carotid arteries are patent to the termini, with atherosclerotic calcifications but without stenosis or other abnormality. A1 segments patent. Normal anterior communicating artery. Anterior cerebral arteries are patent to their distal aspects. No M1 stenosis or occlusion. Normal MCA bifurcations. Distal MCA branches perfused and symmetric. Posterior circulation: Vertebral arteries widely patent to the vertebrobasilar junction without stenosis. Posterior inferior cerebral arteries patent bilaterally. Basilar patent to its distal aspect. Superior cerebral arteries patent bilaterally. PCAs perfused to their distal aspects without stenosis. Unchanged appearance of the right posterior communicating artery, with infundibulum. The left posterior  communicating artery is not visualized. Venous sinuses: As permitted by contrast timing, patent. Anatomic variants: None significant Review of the MIP images confirms the above findings. IMPRESSION: 1. No acute intracranial process. 2. Distal left ICA stent is patent, without residual aneurysm. 3. Stable infundibulum at the origin of the right posterior communicating artery. 4. No large vessel occlusion. Electronically Signed   By: Merilyn Baba M.D.   On: 02/18/2021 15:03   DG Chest 2 View  Result Date: 02/18/2021 CLINICAL DATA:  Chest pain EXAM: CHEST - 2 VIEW COMPARISON:  None. FINDINGS: Cardiomegaly. Left chest single lead pacer defibrillator. Both lungs are clear. The visualized skeletal structures are unremarkable. IMPRESSION: Cardiomegaly without acute abnormality of the lungs. Electronically Signed   By: Delanna Ahmadi M.D.   On: 02/18/2021 11:12   CT ABDOMEN PELVIS W CONTRAST  Result Date: 02/18/2021 CLINICAL DATA:  Abdominal pain, hypotension, headache EXAM: CT ABDOMEN AND PELVIS WITH CONTRAST TECHNIQUE: Multidetector CT imaging of the abdomen and pelvis was performed using the standard protocol following bolus administration of intravenous contrast. CONTRAST:  120mL OMNIPAQUE IOHEXOL 350 MG/ML SOLN COMPARISON:  CT abdomen/pelvis 01/25/2021 FINDINGS: Lower chest: The lung bases are clear. Cardiac device leads are partially imaged. A trace pericardial effusion is unchanged. Hepatobiliary: Scattered subcentimeter hypodense lesions are again seen in the liver, the largest in the liver dome measuring 8 mm, unchanged. There are no new focal lesions. The gallbladder is surgically absent.  Prominence of the extrahepatic bile ducts is unchanged there is no intrahepatic biliary ductal dilatation. Pancreas: Unremarkable. Spleen: Unremarkable. Adrenals/Urinary Tract: A subcentimeter right adrenal nodule is unchanged since 2018. 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 There are no focal lesions or stones. There is no hydronephrosis or hydroureter. The bladder is unremarkable. Stomach/Bowel: The stomach is unremarkable. There is no evidence of bowel obstruction. There is no abnormal bowel wall thickening or inflammatory change. Vascular/Lymphatic: There is extensive soft and calcified atherosclerotic plaque throughout the abdominal aorta with thick mural thrombus in the infrarenal abdominal aorta. There is bulging of the aorta measuring up to 2.6 cm in maximum diameter without evidence of frank aneurysmal dilation. The appearance is unchanged. The major branch vessels are patent with mild stenosis at the origin of the celiac artery. The main portal and splenic veins are patent. There is no abdominal or pelvic lymphadenopathy. Reproductive: The uterus is surgically absent. There is no adnexal mass. Other: There is no ascites or free air. Musculoskeletal: There is no acute osseous abnormality. IMPRESSION: 1. No acute findings in the abdomen or pelvis. Overall no significant interval change since 02/04/2021. 2. Unchanged small hypodense lesion in the liver dome, incompletely characterized. As before, nonemergent outpatient MRI can be considered for characterization. 3. Unchanged trace pericardial effusion. Aortic Atherosclerosis (ICD10-I70.0). Electronically Signed   By: Valetta Mole M.D.   On: 02/18/2021 15:16    My personal review of EKG: Rhythm NSR, Rate  91/min, QTc 479, no Acute ST changes   Assessment & Plan:    Active Problems:   Essential hypertension   Cardiomyopathy, ischemic   Coronary artery disease involving native coronary artery of native heart   Chronic combined systolic and diastolic congestive heart failure (HCC)   Intractable nausea and vomiting   Intractable nausea and vomiting -Patient presents with significant nausea  and vomiting started this morning, possibly related to viral illness, but she does have chronic complaint of such symptoms. -Patient with recent hospitalization at [facility], where she underwent EGD which showed mild nonspecific distal gastritis.  Gastric biopsies taken.  She was significant for gastritis. -She will be admitted overnight, kept on clear liquid diet, as needed nausea medications, and kept on IV fluids. -We will request GI input, given she has some evidence of liver nodules of unknown etiology, to see if it might be related to her symptoms, possible carcinoid?   Hypotension  -Is most likely related to her nausea, vomiting, and multiple antihypertensive regimens, will hold these medications and continue with IV fluids overnight and will watch closely for volume overload given known CHF at baseline. -We will check random cortisol level as well.   Chronic combined systolic and diastolic CHF -Most recent echo of November 2021 with EF 63% -Systolic blood pressure in the 70s on admission, she did respond Juliane liter fluid bolus, but it does remain soft, I will hold her cardiac medications include Coreg, Lasix, Aldactone, and they can be resumed when she is more stable.  History of CAD / HLD -Denies any chest pain or shortness of breath, continue with aspirin, Plavix, statin   Hypokalemia -Due to nausea and vomiting, repleting.  Patient denies any headache currently, it did resolve, CT head with contrast was obtained given history of aneurysm/stent, there is with no acute findings.  DVT Prophylaxis  Lovenox   AM  Labs Ordered, also please review Full Orders  Family Communication: Admission, patients condition and plan of care including tests being ordered have been discussed with the patient who indicate understanding and agree with the plan and Code Status.  Code Status Full  Likely DC to  Home  Condition GUARDED    Consults called: GI requested in EPIC     Admission status: observation    Time spent in minutes : 60 minutes   Phillips Climes M.D on 02/18/2021 at [place] PM   [facility] - Office  [phone]

## 2021-02-18 NOTE — ED Provider Notes (Addendum)
[facility] EMERGENCY DEPARTMENT Provider Note   CSN: [id number] Arrival date & time: 02/18/21  1012     History Chief Complaint  Patient presents with  . Hypotension    Erin [name] is a 60 y.o. female.  Pt presents to the ED today with n/v, headache, and cp.  Pt said she woke up in the night around 0400 with these sx.  She was fine yesterday.  Pt has a hx of htn and took her meds this am.  Bp upon arrival was in the low 80s.  Pt denies f/c.       Past Medical History:  Diagnosis Date  . Anxiety state 03/25/2016  . Basal cell carcinoma of forehead   . Brain aneurysm   . CHF (congestive heart failure) ([place])   . Coronary artery disease    a. 03/11/16 PCI with DES-->Prox/Mid Cx;  b. 03/14/16 PCI with DES x2-->RCA, EF 30-35%.  . Essential hypertension   . GERD (gastroesophageal reflux disease)   . HFrEF (heart failure with reduced ejection fraction) ([place])    a. 10/2016 Echo: EF 35-40%, Gr1 DD, mild focal basal septal hypertrophy, basal inflat, mid inflat, basal antlat AK. Mid infept/inf/antlat, apical lateral sev HK. Mod MR. mildly reduced RV fxn. Mild TR.  [name] Kitchen History of pneumonia   . Hyperlipidemia   . IBS (irritable bowel syndrome)   . Ischemic cardiomyopathy    a. 10/2016 Echo: EF 35-40%, Gr1 DD.  [name] Kitchen Mitral regurgitation   . NSTEMI (non-ST elevated myocardial infarction) ([place]) 03/10/2016  . Pneumonia 03/2016  . Squamous cell cancer of skin of nose   . Thrombocytosis 03/26/2016  . Tobacco abuse   . Trichimoniasis   . Wears dentures   . Wears glasses     Patient Active Problem List   Diagnosis Date Noted  . AKI (acute kidney injury) ([place]) 01/04/2021  . Intractable nausea and vomiting 01/03/2021  . Left-sided weakness 10/19/2020  . Cough 02/11/2020  . Near syncope 12/10/2019  . Brain aneurysm 04/15/2019  . Dysphagia 02/27/2018  . Encounter for screening colonoscopy 02/27/2018  . History of Clostridium difficile infection 02/27/2018  . Frequent stools 12/20/2017   . Chronic combined systolic and diastolic congestive heart failure ([place]) 06/20/2016  . Hypokalemia due to excessive gastrointestinal loss of potassium   . Acute CHF (congestive heart failure) ([place]) 05/16/2016  . Acute on chronic systolic CHF (congestive heart failure) ([place]) 05/16/2016  . Acute respiratory failure with hypoxia ([place])   . Thrombocytosis 03/26/2016  . Cardiomyopathy, ischemic 03/25/2016  . Chronic combined systolic and diastolic heart failure ([place]) 03/25/2016  . Anxiety state 03/25/2016  . Troponin level elevated 03/25/2016  . Coronary artery disease involving native coronary artery of native heart 03/25/2016  . Normocytic anemia 03/25/2016  . SOB (shortness of breath) 03/24/2016  . Lightheadedness 03/17/2016  . Hypotension 03/17/2016  . Tobacco abuse 03/12/2016  . NSTEMI (non-ST elevated myocardial infarction) ([place]) 03/11/2016  . Atypical chest pain   . Essential hypertension 09/06/2015  . Mixed hyperlipidemia 09/06/2015  . GERD without esophagitis 09/06/2015  . Chest pain 09/06/2015    Past Surgical History:  Procedure Laterality Date  . APPENDECTOMY    . BIOPSY  09/20/2018   Procedure: BIOPSY;  Surgeon: Daneil Dolin, MD;  [facility];  Service: Endoscopy;;  colon  . BIOPSY  01/05/2021   Procedure: BIOPSY;  Surgeon: Milus Banister, MD;  Location: [facility] ENDOSCOPY;  Service: Endoscopy;;  . CARDIAC CATHETERIZATION N/A 03/11/2016   Procedure:  Left Heart Cath and Coronary Angiography;  Surgeon: Leonie Man, MD;  Location: [place] CV LAB;  Service: Cardiovascular;  Laterality: N/A;  . CARDIAC CATHETERIZATION N/A 03/11/2016   Procedure: Coronary Stent Intervention;  Surgeon: Leonie Man, MD;  Location: [place] CV LAB;  Service: Cardiovascular;  Laterality: N/A;  . CARDIAC CATHETERIZATION N/A 03/14/2016   Procedure: Coronary Stent Intervention;  Surgeon: Peter M Martinique, MD;  Location: [place] CV LAB;  Service: Cardiovascular;  Laterality: N/A;   . CHOLECYSTECTOMY OPEN  1984  . COLONOSCOPY WITH PROPOFOL N/A 09/20/2018   Procedure: COLONOSCOPY WITH PROPOFOL;  Surgeon: Daneil Dolin, MD;  [facility];  Service: Endoscopy;  Laterality: N/A;  10:30am  . CORONARY ANGIOPLASTY WITH STENT PLACEMENT  03/14/2016  . ESOPHAGOGASTRODUODENOSCOPY (EGD) WITH PROPOFOL N/A 09/20/2018   Procedure: ESOPHAGOGASTRODUODENOSCOPY (EGD) WITH PROPOFOL;  Surgeon: Daneil Dolin, MD;  [facility];  Service: Endoscopy;  Laterality: N/A;  . ESOPHAGOGASTRODUODENOSCOPY (EGD) WITH PROPOFOL N/A 01/05/2021   Procedure: ESOPHAGOGASTRODUODENOSCOPY (EGD) WITH PROPOFOL;  Surgeon: Milus Banister, MD;  Location: [facility] ENDOSCOPY;  Service: Endoscopy;  Laterality: N/A;  . FINGER ARTHROPLASTY Left 05/14/2013   Procedure: LEFT THUMB CARPAL METACARPAL ARTHROPLASTY;  Surgeon: Tennis Must, MD;  Location: [place];  Service: Orthopedics;  Laterality: Left;  . ICD IMPLANT N/A 04/03/2020   Procedure: ICD IMPLANT;  Surgeon: Constance Haw, MD;  Location: [place] CV LAB;  Service: Cardiovascular;  Laterality: N/A;  . IR ANGIO INTRA EXTRACRAN SEL COM CAROTID INNOMINATE BILAT MOD SED  01/05/2017  . IR ANGIO INTRA EXTRACRAN SEL COM CAROTID INNOMINATE BILAT MOD SED  03/19/2019  . IR ANGIO INTRA EXTRACRAN SEL COM CAROTID INNOMINATE BILAT MOD SED  06/04/2020  . IR ANGIO INTRA EXTRACRAN SEL INTERNAL CAROTID UNI L MOD SED  04/15/2019  . IR ANGIO VERTEBRAL SEL VERTEBRAL BILAT MOD SED  01/05/2017  . IR ANGIO VERTEBRAL SEL VERTEBRAL BILAT MOD SED  03/19/2019  . IR ANGIO VERTEBRAL SEL VERTEBRAL UNI L MOD SED  06/04/2020  . IR ANGIOGRAM FOLLOW UP STUDY  04/15/2019  . IR RADIOLOGIST EVAL & MGMT  12/30/2016  . IR TRANSCATH/EMBOLIZ  04/15/2019  . IR US GUIDE VASC ACCESS RIGHT  03/19/2019  . IR US GUIDE VASC ACCESS RIGHT  06/04/2020  . MALONEY DILATION N/A 09/20/2018   Procedure: Venia Minks DILATION;  Surgeon: Daneil Dolin, MD;  [facility];   Service: Endoscopy;  Laterality: N/A;  . RADIOLOGY WITH ANESTHESIA N/A 04/15/2019   Procedure: [employer];  Surgeon: Luanne Bras, MD;  Location: [place];  Service: Radiology;  Laterality: N/A;  . RIGHT/LEFT HEART CATH AND CORONARY ANGIOGRAPHY N/A 08/19/2019   Procedure: RIGHT/LEFT HEART CATH AND CORONARY ANGIOGRAPHY;  Surgeon: Larey Dresser, MD;  Location: [place] CV LAB;  Service: Cardiovascular;  Laterality: N/A;  . TUBAL LIGATION  1987  . VAGINAL HYSTERECTOMY  2009     OB History     Gravida  4   Para  4   Term  4   Preterm      AB      Living  4      SAB      IAB      Ectopic      Multiple      Live Births              Family History  Problem Relation Age of Onset  . Diabetes Father   .  Hypertension Father   . CAD Father   . Colon polyps Father 60       pre-cancerous   . Stroke Father   . Dementia Father   . Stroke Mother   . Hypertension Mother   . Diabetes Mother   . Heart failure Other   . Breast cancer Maternal Grandmother   . Colon cancer Neg Hx     Social History   Tobacco Use  . Smoking status: Some Days    Packs/day: 0.10    Years: 15.00    Pack years: 1.50    Types: Cigarettes  . Smokeless tobacco: Never  . Tobacco comments:    smokes a cigarette occasionally  Vaping Use  . Vaping Use: Never used  Substance Use Topics  . Alcohol use: Yes    Comment: occasionally  . Drug use: Not Currently    Types: Marijuana    Comment: former- 2017 last time    Home Medications Prior to Admission medications   Medication Sig Start Date End Date Taking? Authorizing Provider  ALPRAZolam Duanne Moron) 1 MG tablet Take 1 mg by mouth See admin instructions. Take 1 mg by mouth at bedtime and an additional 1 mg daily as needed for anxiety   Yes [provider]  aspirin EC 81 MG tablet Take 81 mg by mouth every evening.    Yes [provider]  atorvastatin (LIPITOR) 80 MG tablet Take 1 tablet (80 mg total) by mouth daily  at 6 PM. 11/10/20  Yes Larey Dresser, MD  carvedilol (COREG) 3.125 MG tablet Take 1 tablet (3.125 mg total) by mouth 2 (two) times daily. 02/08/21 05/09/21 Yes Clegg, Amy D, NP  Cholecalciferol (VITAMIN D-3 PO) Take 1 capsule by mouth daily with breakfast.   Yes [provider]  clopidogrel (PLAVIX) 75 MG tablet Take 1 tablet (75 mg total) by mouth daily. 10/27/20  Yes Louk, Alexandra M, PA-C  dapagliflozin propanediol (FARXIGA) 10 MG TABS tablet Take 1 tablet (10 mg total) by mouth daily before breakfast. 01/25/21  Yes [place], Maricela Bo, FNP  ezetimibe (ZETIA) 10 MG tablet Take 1 tablet (10 mg total) by mouth daily. 07/07/20  Yes Larey Dresser, MD  furosemide (LASIX) 20 MG tablet Take 1 tablet (20 mg total) by mouth daily. 01/08/21 04/08/21 Yes Larey Dresser, MD  gabapentin (NEURONTIN) 100 MG capsule Take 200 mg by mouth at bedtime.   Yes [provider]  loperamide (IMODIUM) 2 MG capsule Take 2 mg by mouth 4 (four) times daily as needed for diarrhea or loose stools (ibs).    Yes [provider]  Multiple Vitamins-Minerals (MULTIVITAMIN WITH MINERALS) tablet Take 1 tablet by mouth daily.   Yes [provider]  nicotine (NICODERM CQ - DOSED IN MG/24 HOURS) 21 mg/24hr patch Place 21 mg onto the skin daily.   Yes [provider]  nitroGLYCERIN (NITROSTAT) 0.4 MG SL tablet Place 1 tablet (0.4 mg total) under the tongue every 5 (five) minutes x 3 doses as needed for chest pain (if no relief after 2nd dose, proceed to the ED for an evaluation or call 911). 09/01/20  Yes Verta Ellen., NP  omeprazole (PRILOSEC) 20 MG capsule Take 1 capsule (20 mg total) by mouth daily. 01/06/21 02/08/22 Yes Dahal, Marlowe Aschoff, MD  rOPINIRole (REQUIP) 0.5 MG tablet Take 0.5 mg by mouth at bedtime.  11/13/17  Yes [provider]  spironolactone (ALDACTONE) 25 MG tablet Take 1 tablet (25 mg total) by mouth  at bedtime. 02/08/21 05/09/21 Yes Clegg, Amy D, NP  zinc gluconate 50 MG  tablet Take 50 mg by mouth daily.   Yes [provider]    Allergies    Tape  Review of Systems   Review of Systems  Cardiovascular:  Positive for chest pain.  Gastrointestinal:  Positive for nausea and vomiting.  Musculoskeletal:  Positive for neck pain.  Neurological:  Positive for headaches.  All other systems reviewed and are negative.  Physical Exam Updated Vital Signs BP 105/74   Pulse 84   Temp (!) 97 F (36.1 C)   Resp 14   SpO2 100%   Physical Exam Vitals and nursing note reviewed.  Constitutional:      Appearance: Normal appearance.  HENT:     Head: Normocephalic and atraumatic.     Right Ear: External ear normal.     Left Ear: External ear normal.     Nose: Nose normal.     Mouth/Throat:     Mouth: Mucous membranes are dry.  Eyes:     Extraocular Movements: Extraocular movements intact.     Conjunctiva/sclera: Conjunctivae normal.     Pupils: Pupils are equal, round, and reactive to light.  Cardiovascular:     Rate and Rhythm: Normal rate and regular rhythm.     Pulses: Normal pulses.     Heart sounds: Normal heart sounds.  Pulmonary:     Effort: Pulmonary effort is normal.     Breath sounds: Normal breath sounds.  Abdominal:     General: Abdomen is flat. Bowel sounds are normal.     Palpations: Abdomen is soft.  Musculoskeletal:        General: Normal range of motion.     Cervical back: Normal range of motion and neck supple.  Skin:    General: Skin is warm.     Capillary Refill: Capillary refill takes less than 2 seconds.  Neurological:     General: No focal deficit present.     Mental Status: She is alert and oriented to person, place, and time.  Psychiatric:        Mood and Affect: Mood normal.        Behavior: Behavior normal.    ED Results / Procedures / Treatments   Labs (all labs ordered are listed, but only abnormal results are displayed) Labs Reviewed  BASIC METABOLIC PANEL - Abnormal; Notable for the following  components:      Result Value   Potassium 3.2 (*)    Glucose, Bld 116 (*)    BUN 21 (*)    Creatinine, Ser 1.05 (*)    All other components within normal limits  LIPASE, BLOOD - Abnormal; Notable for the following components:   Lipase 67 (*)    All other components within normal limits  HEPATIC FUNCTION PANEL - Abnormal; Notable for the following components:   Indirect Bilirubin 1.0 (*)    All other components within normal limits  CULTURE, BLOOD (ROUTINE X 2)  CULTURE, BLOOD (ROUTINE X 2)  RESP PANEL BY RT-PCR (FLU A&B, COVID) ARPGX2  CBC  LACTIC ACID, PLASMA  MAGNESIUM  URINALYSIS, ROUTINE W REFLEX MICROSCOPIC  POC URINE PREG, ED  TROPONIN I (HIGH SENSITIVITY)  TROPONIN I (HIGH SENSITIVITY)    EKG EKG Interpretation  Date/Time:  Thursday February 18 2021 10:39:46 EDT Ventricular Rate:  91 PR Interval:  130 QRS Duration: 86 QT Interval:  390 QTC Calculation: 479 R Axis:   27 Text Interpretation: Sinus rhythm  with frequent Premature ventricular complexes Otherwise normal ECG pvcs are new Confirmed by Isla Pence [phone]) on 02/18/2021 11:27:08 AM  Radiology CT Angio Head W or Wo Contrast  Result Date: 02/18/2021 CLINICAL DATA:  Neuro deficit, stroke suspected EXAM: CT ANGIOGRAPHY HEAD TECHNIQUE: Multidetector CT imaging of the head was performed using the standard protocol during bolus administration of intravenous contrast. Multiplanar CT image reconstructions and MIPs were obtained to evaluate the vascular anatomy. CONTRAST:  162mL OMNIPAQUE IOHEXOL 350 MG/ML SOLN COMPARISON:  12/10/2020 FINDINGS: CT HEAD Brain: Stable left posterior parietal remote infarct. No acute infarct, hemorrhage, hydrocephalus, extra-axial collection, mass, mass effect, or midline shift. Vascular: No hyperdense vessel.  See CTA findings. Skull: Normal. Negative for fracture or focal lesion. Sinuses: No acute or significant finding. Other: None. CTA HEAD Anterior circulation: Redemonstrated distal  left ICA stent, which appears patent. No residual aneurysm seen. Both internal carotid arteries are patent to the termini, with atherosclerotic calcifications but without stenosis or other abnormality. A1 segments patent. Normal anterior communicating artery. Anterior cerebral arteries are patent to their distal aspects. No M1 stenosis or occlusion. Normal MCA bifurcations. Distal MCA branches perfused and symmetric. Posterior circulation: Vertebral arteries widely patent to the vertebrobasilar junction without stenosis. Posterior inferior cerebral arteries patent bilaterally. Basilar patent to its distal aspect. Superior cerebral arteries patent bilaterally. PCAs perfused to their distal aspects without stenosis. Unchanged appearance of the right posterior communicating artery, with infundibulum. The left posterior communicating artery is not visualized. Venous sinuses: As permitted by contrast timing, patent. Anatomic variants: None significant Review of the MIP images confirms the above findings. IMPRESSION: 1. No acute intracranial process. 2. Distal left ICA stent is patent, without residual aneurysm. 3. Stable infundibulum at the origin of the right posterior communicating artery. 4. No large vessel occlusion. Electronically Signed   By: Merilyn Baba M.D.   On: 02/18/2021 15:03   DG Chest 2 View  Result Date: 02/18/2021 CLINICAL DATA:  Chest pain EXAM: CHEST - 2 VIEW COMPARISON:  None. FINDINGS: Cardiomegaly. Left chest single lead pacer defibrillator. Both lungs are clear. The visualized skeletal structures are unremarkable. IMPRESSION: Cardiomegaly without acute abnormality of the lungs. Electronically Signed   By: Delanna Ahmadi M.D.   On: 02/18/2021 11:12   CT ABDOMEN PELVIS W CONTRAST  Result Date: 02/18/2021 CLINICAL DATA:  Abdominal pain, hypotension, headache EXAM: CT ABDOMEN AND PELVIS WITH CONTRAST TECHNIQUE: Multidetector CT imaging of the abdomen and pelvis was performed using the  standard protocol following bolus administration of intravenous contrast. CONTRAST:  125mL OMNIPAQUE IOHEXOL 350 MG/ML SOLN COMPARISON:  CT abdomen/pelvis 01/25/2021 FINDINGS: Lower chest: The lung bases are clear. Cardiac device leads are partially imaged. A trace pericardial effusion is unchanged. Hepatobiliary: Scattered subcentimeter hypodense lesions are again seen in the liver, the largest in the liver dome measuring 8 mm, unchanged. There are no new focal lesions. The gallbladder is surgically absent. Prominence of the extrahepatic bile ducts is unchanged there is no intrahepatic biliary ductal dilatation. Pancreas: Unremarkable. Spleen: Unremarkable. Adrenals/Urinary Tract: A subcentimeter right adrenal nodule is unchanged since 2018. 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 There are no focal lesions or stones. There is no hydronephrosis or hydroureter. The bladder is unremarkable. Stomach/Bowel: The stomach is unremarkable. There is no evidence of bowel obstruction. There is no abnormal bowel wall thickening or inflammatory change. Vascular/Lymphatic: There is  extensive soft and calcified atherosclerotic plaque throughout the abdominal aorta with thick mural thrombus in the infrarenal abdominal aorta. There is bulging of the aorta measuring up to 2.6 cm in maximum diameter without evidence of frank aneurysmal dilation. The appearance is unchanged. The major branch vessels are patent with mild stenosis at the origin of the celiac artery. The main portal and splenic veins are patent. There is no abdominal or pelvic lymphadenopathy. Reproductive: The uterus is surgically absent. There is no adnexal mass. Other: There is no ascites or free air. Musculoskeletal: There is no acute osseous abnormality. IMPRESSION: 1. No acute findings in the  abdomen or pelvis. Overall no significant interval change since 02/04/2021. 2. Unchanged small hypodense lesion in the liver dome, incompletely characterized. As before, nonemergent outpatient MRI can be considered for characterization. 3. Unchanged trace pericardial effusion. Aortic Atherosclerosis (ICD10-I70.0). Electronically Signed   By: Valetta Mole M.D.   On: 02/18/2021 15:16    Procedures Procedures   Medications Ordered in ED Medications  sodium chloride 0.9 % bolus 1,000 mL (0 mLs Intravenous Stopped 02/18/21 1400)  ondansetron (ZOFRAN) injection 4 mg (4 mg Intravenous Given 02/18/21 1203)  sodium chloride 0.9 % bolus 1,000 mL (0 mLs Intravenous Stopped 02/18/21 1515)  iohexol (OMNIPAQUE) 350 MG/ML injection 100 mL (100 mLs Intravenous Contrast Given 02/18/21 1452)  sodium chloride 0.9 % bolus 1,000 mL (1,000 mLs Intravenous New Bag/Given 02/18/21 1536)    ED Course  I have reviewed the triage vital signs and the nursing notes.  Pertinent labs & imaging results that were available during my care of the patient were reviewed by me and considered in my medical decision making (see chart for details).    MDM Rules/Calculators/A&P                           Pt given IVFs for hypotension.  I doubt sepsis as she is not febrile and lactic is nl.  Hypotension is likely from volume depletion plus she took her bp meds this am.  BP is gradually going up with fluids, but still remains low.  CT scans do not show anything acute.   Pt d/w Dr. Waldron [facility] (triad) for admission.  CRITICAL CARE Performed by: Isla Pence   Total critical care time: 30 minutes  Critical care time was exclusive of separately billable procedures and treating other patients.  Critical care was necessary to treat or prevent imminent or life-threatening deterioration.  Critical care was time spent personally by me on the following activities: development of treatment plan with patient and/or surrogate as well  as nursing, discussions with consultants, evaluation of patient's response to treatment, examination of patient, obtaining history from patient or surrogate, ordering and performing treatments and interventions, ordering and review of laboratory studies, ordering and review of radiographic studies, pulse oximetry and re-evaluation of patient's condition.    Final Clinical Impression(s) / ED Diagnoses Final diagnoses:  Dehydration  Hypotension, unspecified hypotension type  Nausea vomiting and diarrhea  Atypical chest pain  Acute nonintractable headache, unspecified headache type    Rx / DC Orders ED Discharge Orders     None        Isla Pence, MD 02/18/21 1558    Isla Pence, MD 02/18/21 1559

## 2021-02-18 NOTE — ED Triage Notes (Signed)
Nausea and vomiting, headache and chest pain onset 0400 today

## 2021-02-19 ENCOUNTER — Encounter (HOSPITAL_COMMUNITY): Payer: Self-pay | Admitting: Internal Medicine

## 2021-02-19 DIAGNOSIS — I25119 Atherosclerotic heart disease of native coronary artery with unspecified angina pectoris: Secondary | ICD-10-CM

## 2021-02-19 DIAGNOSIS — I10 Essential (primary) hypertension: Secondary | ICD-10-CM

## 2021-02-19 DIAGNOSIS — I5042 Chronic combined systolic (congestive) and diastolic (congestive) heart failure: Secondary | ICD-10-CM | POA: Diagnosis not present

## 2021-02-19 DIAGNOSIS — K529 Noninfective gastroenteritis and colitis, unspecified: Secondary | ICD-10-CM | POA: Diagnosis not present

## 2021-02-19 DIAGNOSIS — R112 Nausea with vomiting, unspecified: Secondary | ICD-10-CM | POA: Diagnosis not present

## 2021-02-19 DIAGNOSIS — I255 Ischemic cardiomyopathy: Secondary | ICD-10-CM | POA: Diagnosis not present

## 2021-02-19 LAB — CBC
HCT: 37.2 % (ref 36.0–46.0)
Hemoglobin: 12.1 g/dL (ref 12.0–15.0)
MCH: 29.3 pg (ref 26.0–34.0)
MCHC: 32.5 g/dL (ref 30.0–36.0)
MCV: 90.1 fL (ref 80.0–100.0)
Platelets: 314 10*3/uL (ref 150–400)
RBC: 4.13 MIL/uL (ref 3.87–5.11)
RDW: 14.6 % (ref 11.5–15.5)
WBC: 7.1 10*3/uL (ref 4.0–10.5)
nRBC: 0 % (ref 0.0–0.2)

## 2021-02-19 LAB — BASIC METABOLIC PANEL
Anion gap: 6 (ref 5–15)
BUN: 12 mg/dL (ref 6–20)
CO2: 22 mmol/L (ref 22–32)
Calcium: 8.6 mg/dL — ABNORMAL LOW (ref 8.9–10.3)
Chloride: 112 mmol/L — ABNORMAL HIGH (ref 98–111)
Creatinine, Ser: 0.93 mg/dL (ref 0.44–1.00)
GFR, Estimated: >60 mL/min (ref >60)
Glucose, Bld: 78 mg/dL (ref 70–99)
Potassium: 4 mmol/L (ref 3.5–5.1)
Sodium: 140 mmol/L (ref 135–145)

## 2021-02-19 LAB — TSH: TSH: 0.424 u[IU]/mL (ref 0.350–4.500)

## 2021-02-19 NOTE — Progress Notes (Signed)
PROGRESS NOTE  DUTCHESS CROSLAND IWP:[id number] DOB: Oct 27, 1960 DOA: 02/18/2021 PCP: [name], MD  HPI/Recap of past 24 hours: Bay Jarquin  is a 60 y.o. female, with PMH significant for ischemic cardiomyopathy s/p ICD, (Echo 03/2020 EF 30-35% with G2DD), CAD (S/P DES to proximal Lcx and PDA, last cath 08/2019 revealing patent stents and minimal disease), posterior communicating artery brain aneurysm (S/P embolization 04/2019), GERD, nicotine dependence, presents to ED 2/2 complaints of nausea, vomiting and diarrhea, as well headache. Pt also reports dizziness, diaphoresis with presyncope. Of note, pt with recent hospitalization at [facility] in late August for similar complaints, where she had endoscopy significant for gastritis, as well her CT abdomen was significant for liver lesions of unknown etiology. Pt reports that since her BP meds were  restarted about 2 weeks ago, she has been feeling weak. In the ED, noted to be hypotensive, SBP in the 70s, she received total of 2 L fluid bolus with improvement. CT abdomen pelvis was significant for liver lesions, with no significant change in size. Labs fairly stable. [facility] consulted to admit.    Today, pt still reports nausea, poor appetite and weakness. Denies any further vomiting, abdominal pain, chest pain, worsening SOB, fever/chills.    Assessment/Plan: Active Problems:   Essential hypertension   Cardiomyopathy, ischemic   Coronary artery disease involving native coronary artery of native heart   Chronic combined systolic and diastolic congestive heart failure (HCC)   Intractable nausea and vomiting   Intractable nausea and vomiting Pt attributes her overall symptoms to hypotension 2/2 BP meds EGD done in 12/2020 showed H.pylori gastritis and completed treatment CT abdomen pelvis was significant for liver lesions, with no significant change in size, GI plans for outpt MRI (pt has ICD) CT head done given hx of  aneurysm (negative for any intracranial abnormality) GI consulted, appreciate recs with extensive work up S/P IVF Supportive management, advance diet as tolerated   Hypotension Weakness/dizziness BP improved s/p IVF Likely 2/2 nausea, vomiting, and antihypertensive regimen S/P IVF, will d/c 2/2 CHF Continue to hold HF/BP meds   Chronic combined systolic and diastolic CHF Currently appears euvolemic Most recent echo of November 2021 with EF 30% Continue to hold Coreg, Lasix, Aldactone, and gradually restart pending BP   History of CAD / HLD Denies any chest pain Trop X 2 neg Continue with aspirin, Plavix, statin   Tobacco abuse Advised to quit      Estimated body mass index is 22.99 kg/m as calculated from the following:   Height as of this encounter: 5' (1.524 m).   Weight as of this encounter: 53.4 kg.     Code Status: Full  Family Communication: None at bedside  Disposition Plan: Status is: Observation  The patient will require care spanning > 2 midnights and should be moved to inpatient because: Level of care    Consultants: GI  Procedures: None  Antimicrobials: None  DVT prophylaxis: Lovenox   Objective: Vitals:   02/18/21 1843 02/18/21 1900 02/18/21 2226 02/19/21 0616  BP: 103/77 112/83 117/81 116/71  Pulse: 76 80 79 80  Resp: 19 18    Temp: 97.6 F (36.4 C)  98.4 F (36.9 C) 98.3 F (36.8 C)  TempSrc: Oral  Oral Oral  SpO2: 99% 100% 100% 100%  Weight:  53.4 kg    Height:  5' (1.524 m)      Intake/Output Summary (Last 24 hours) at 02/19/2021 1235 Last data filed at 02/19/2021 0900 Gross per  24 hour  Intake 720 ml  Output --  Net 720 ml   Filed Weights   02/18/21 1900  Weight: 53.4 kg    Exam: General: NAD  Cardiovascular: S1, S2 present Respiratory: CTAB Abdomen: Soft, nontender, nondistended, bowel sounds present Musculoskeletal: No bilateral pedal edema noted Skin: Normal Psychiatry: Normal mood     Data  Reviewed: CBC: Recent Labs  Lab 02/18/21 1118 02/19/21 0636  WBC 9.9 7.1  HGB 13.1 12.1  HCT 39.6 37.2  MCV 87.2 90.1  PLT 300 818   Basic Metabolic Panel: Recent Labs  Lab 02/15/21 0825 02/18/21 1118 02/18/21 1217 02/19/21 0636  NA 139 135  --  140  K 4.2 3.2*  --  4.0  CL 101 101  --  112*  CO2 22 24  --  22  GLUCOSE 95 116*  --  78  BUN 26* 21*  --  12  CREATININE 1.32* 1.05*  --  0.93  CALCIUM 9.9 9.0  --  8.6*  MG  --   --  2.2  --    GFR: Estimated Creatinine Clearance: 47.4 mL/min (by C-G formula based on SCr of 0.93 mg/dL). Liver Function Tests: Recent Labs  Lab 02/18/21 1119  AST 22  ALT 18  ALKPHOS 68  BILITOT 1.1  PROT 6.7  ALBUMIN 3.8   Recent Labs  Lab 02/18/21 1119  LIPASE 67*   No results for input(s): AMMONIA in the last 168 hours. Coagulation Profile: No results for input(s): INR, PROTIME in the last 168 hours. Cardiac Enzymes: No results for input(s): CKTOTAL, CKMB, CKMBINDEX, TROPONINI in the last 168 hours. BNP (last 3 results) No results for input(s): PROBNP in the last 8760 hours. HbA1C: No results for input(s): HGBA1C in the last 72 hours. CBG: No results for input(s): GLUCAP in the last 168 hours. Lipid Profile: No results for input(s): CHOL, HDL, LDLCALC, TRIG, CHOLHDL, LDLDIRECT in the last 72 hours. Thyroid Function Tests: No results for input(s): TSH, T4TOTAL, FREET4, T3FREE, THYROIDAB in the last 72 hours. Anemia Panel: No results for input(s): VITAMINB12, FOLATE, FERRITIN, TIBC, IRON, RETICCTPCT in the last 72 hours. Urine analysis:    Component Value Date/Time   COLORURINE STRAW (A) 02/18/2021 1743   APPEARANCEUR CLEAR 02/18/2021 1743   LABSPEC 1.022 02/18/2021 1743   PHURINE 6.0 02/18/2021 1743   GLUCOSEU >=500 (A) 02/18/2021 1743   HGBUR NEGATIVE 02/18/2021 [place] 02/18/2021 1743   KETONESUR 5 (A) 02/18/2021 1743   PROTEINUR NEGATIVE 02/18/2021 1743   NITRITE NEGATIVE 02/18/2021 1743    LEUKOCYTESUR NEGATIVE 02/18/2021 1743   Sepsis Labs: @LABRCNTIP (procalcitonin:4,lacticidven:4)  ) Recent Results (from the past 240 hour(s))  Culture, blood (routine x 2)     Status: None (Preliminary result)   Collection Time: 02/18/21 12:16 PM   Specimen: BLOOD RIGHT HAND  Result Value Ref Range Status   Specimen Description BLOOD RIGHT HAND  Final   Special Requests   Final    Blood Culture results may not be optimal due to an inadequate volume of blood received in culture bottles BOTTLES DRAWN AEROBIC ONLY   Culture   Final    NO GROWTH < 24 HOURS Performed at [facility], [address], [place], [place] [address]    Report Status PENDING  Incomplete  Culture, blood (routine x 2)     Status: None (Preliminary result)   Collection Time: 02/18/21 12:16 PM   Specimen: Left Antecubital; Blood  Result Value Ref Range Status  Specimen Description LEFT ANTECUBITAL  Final   Special Requests   Final    Blood Culture results may not be optimal due to an excessive volume of blood received in culture bottles BOTTLES DRAWN AEROBIC AND ANAEROBIC   Culture   Final    NO GROWTH < 24 HOURS Performed at [facility], [address], [place], [place] [address]    Report Status PENDING  Incomplete  Resp Panel by RT-PCR (Flu A&B, Covid) Nasopharyngeal Swab     Status: None   Collection Time: 02/18/21  5:32 PM   Specimen: Nasopharyngeal Swab; Nasopharyngeal(NP) swabs in vial transport medium  Result Value Ref Range Status   SARS Coronavirus 2 by RT PCR NEGATIVE NEGATIVE Final    Comment: (NOTE) SARS-CoV-2 target nucleic acids are NOT DETECTED.  The SARS-CoV-2 RNA is generally detectable in upper respiratory specimens during the acute phase of infection. The lowest concentration of SARS-CoV-2 viral copies this assay can detect is 138 copies/mL. A negative result does not preclude SARS-Cov-2 infection and should not be used as the sole basis for treatment or other patient management  decisions. A negative result may occur with  improper specimen collection/handling, submission of specimen other than nasopharyngeal swab, presence of viral mutation(s) within the areas targeted by this assay, and inadequate number of viral copies(<138 copies/mL). A negative result must be combined with clinical observations, patient history, and epidemiological information. The expected result is Negative.  Fact Sheet for Patients:  EntrepreneurPulse.com.au  Fact Sheet for Healthcare Providers:  IncredibleEmployment.be  This test is no t yet approved or cleared by the [place] FDA and  has been authorized for detection and/or diagnosis of SARS-CoV-2 by FDA under an Emergency Use Authorization (EUA). This EUA will remain  in effect (meaning this test can be used) for the duration of the COVID-19 declaration under Section 564(b)(1) of the Act, 21 U.S.C.section 360bbb-3(b)(1), unless the authorization is terminated  or revoked sooner.       Influenza A by PCR NEGATIVE NEGATIVE Final   Influenza B by PCR NEGATIVE NEGATIVE Final    Comment: (NOTE) The Xpert Xpress SARS-CoV-2/FLU/RSV plus assay is intended as an aid in the diagnosis of influenza from Nasopharyngeal swab specimens and should not be used as a sole basis for treatment. Nasal washings and aspirates are unacceptable for Xpert Xpress SARS-CoV-2/FLU/RSV testing.  Fact Sheet for Patients: EntrepreneurPulse.com.au  Fact Sheet for Healthcare Providers: IncredibleEmployment.be  This test is not yet approved or cleared by the [place] FDA and has been authorized for detection and/or diagnosis of SARS-CoV-2 by FDA under an Emergency Use Authorization (EUA). This EUA will remain in effect (meaning this test can be used) for the duration of the COVID-19 declaration under Section 564(b)(1) of the Act, 21 U.S.C. section 360bbb-3(b)(1), unless the  authorization is terminated or revoked.  Performed at [facility], [address] [place], [place] [address]       Studies: CT Angio Head W or Wo Contrast  Result Date: 02/18/2021 CLINICAL DATA:  Neuro deficit, stroke suspected EXAM: CT ANGIOGRAPHY HEAD TECHNIQUE: Multidetector CT imaging of the head was performed using the standard protocol during bolus administration of intravenous contrast. Multiplanar CT image reconstructions and MIPs were obtained to evaluate the vascular anatomy. CONTRAST:  145mL OMNIPAQUE IOHEXOL 350 MG/ML SOLN COMPARISON:  12/10/2020 FINDINGS: CT HEAD Brain: Stable left posterior parietal remote infarct. No acute infarct, hemorrhage, hydrocephalus, extra-axial collection, mass, mass effect, or midline shift. Vascular: No hyperdense vessel.  See CTA findings. Skull:  Normal. Negative for fracture or focal lesion. Sinuses: No acute or significant finding. Other: None. CTA HEAD Anterior circulation: Redemonstrated distal left ICA stent, which appears patent. No residual aneurysm seen. Both internal carotid arteries are patent to the termini, with atherosclerotic calcifications but without stenosis or other abnormality. A1 segments patent. Normal anterior communicating artery. Anterior cerebral arteries are patent to their distal aspects. No M1 stenosis or occlusion. Normal MCA bifurcations. Distal MCA branches perfused and symmetric. Posterior circulation: Vertebral arteries widely patent to the vertebrobasilar junction without stenosis. Posterior inferior cerebral arteries patent bilaterally. Basilar patent to its distal aspect. Superior cerebral arteries patent bilaterally. PCAs perfused to their distal aspects without stenosis. Unchanged appearance of the right posterior communicating artery, with infundibulum. The left posterior communicating artery is not visualized. Venous sinuses: As permitted by contrast timing, patent. Anatomic variants: None significant Review of the MIP  images confirms the above findings. IMPRESSION: 1. No acute intracranial process. 2. Distal left ICA stent is patent, without residual aneurysm. 3. Stable infundibulum at the origin of the right posterior communicating artery. 4. No large vessel occlusion. Electronically Signed   By: Merilyn Baba M.D.   On: 02/18/2021 15:03   CT ABDOMEN PELVIS W CONTRAST  Result Date: 02/18/2021 CLINICAL DATA:  Abdominal pain, hypotension, headache EXAM: CT ABDOMEN AND PELVIS WITH CONTRAST TECHNIQUE: Multidetector CT imaging of the abdomen and pelvis was performed using the standard protocol following bolus administration of intravenous contrast. CONTRAST:  16mL OMNIPAQUE IOHEXOL 350 MG/ML SOLN COMPARISON:  CT abdomen/pelvis 01/25/2021 FINDINGS: Lower chest: The lung bases are clear. Cardiac device leads are partially imaged. A trace pericardial effusion is unchanged. Hepatobiliary: Scattered subcentimeter hypodense lesions are again seen in the liver, the largest in the liver dome measuring 8 mm, unchanged. There are no new focal lesions. The gallbladder is surgically absent. Prominence of the extrahepatic bile ducts is unchanged there is no intrahepatic biliary ductal dilatation. Pancreas: Unremarkable. Spleen: Unremarkable. Adrenals/Urinary Tract: A subcentimeter right adrenal nodule is unchanged since 2018. 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 There are no focal lesions or stones. There is no hydronephrosis or hydroureter. The bladder is unremarkable. Stomach/Bowel: The stomach is unremarkable. There is no evidence of bowel obstruction. There is no abnormal bowel wall thickening or inflammatory change. Vascular/Lymphatic: There is extensive soft and calcified atherosclerotic plaque throughout the abdominal aorta with thick mural thrombus in the  infrarenal abdominal aorta. There is bulging of the aorta measuring up to 2.6 cm in maximum diameter without evidence of frank aneurysmal dilation. The appearance is unchanged. The major branch vessels are patent with mild stenosis at the origin of the celiac artery. The main portal and splenic veins are patent. There is no abdominal or pelvic lymphadenopathy. Reproductive: The uterus is surgically absent. There is no adnexal mass. Other: There is no ascites or free air. Musculoskeletal: There is no acute osseous abnormality. IMPRESSION: 1. No acute findings in the abdomen or pelvis. Overall no significant interval change since 02/04/2021. 2. Unchanged small hypodense lesion in the liver dome, incompletely characterized. As before, nonemergent outpatient MRI can be considered for characterization. 3. Unchanged trace pericardial effusion. Aortic Atherosclerosis (ICD10-I70.0). Electronically Signed   By: Valetta Mole M.D.   On: 02/18/2021 15:16    Scheduled Meds:  ALPRAZolam  1 mg Oral QHS   aspirin EC  81 mg Oral QPM  atorvastatin  80 mg Oral q1800   clopidogrel  75 mg Oral Daily   dapagliflozin propanediol  10 mg Oral QAC breakfast   enoxaparin (LOVENOX) injection  40 mg Subcutaneous Q24H   nicotine  21 mg Transdermal Daily   pantoprazole  40 mg Oral Daily   rOPINIRole  0.5 mg Oral QHS    Continuous Infusions:   LOS: 0 days     Alma Friendly, MD [facility]  If 7PM-7AM, please contact night-coverage [email or web address] 02/19/2021, 12:35 PM

## 2021-02-19 NOTE — Consult Note (Signed)
Referring Provider: Alma Friendly, MD Primary Care Physician:  [name], MD Primary Gastroenterologist:  Garfield Cornea, MD  Reason for Consultation:  N/V/D with liver lesions R/O carcinoid  HPI: Erin [name] is a 60 y.o. female with h/o C.diff 12/2017, chronic dysphagia, chronic GERD, ischemic cardiomyopathy, CAD/stents, ICD, embolization of enlarging left posterior communicating artery aneurysm presenting to the ED with a chief complaint of nausea/vomiting, headache, and chest pain.  Patient states she felt bad at work yesterday. She works as a Quarry manager at [employer]. She felt dizzy, became sweaty and had to sit down and rest. She went home from work. Continued to feel poorly so decided to come to the ER early yesterday morning. En route to the ER, she started vomiting. She states she has been having N/V off and on since June/July this year. She was hospitalized in August for similar symptoms. Her appetite has been poor for several months. Has to force herself to eat.  Has episodes of intermittent vomiting.  Has associated her nausea/vomiting with changes in her blood pressure.  After discharged in August some of her medications were discontinued including Entresto, Farxiga, spironolactone.  She has since resumed [place], furosemide, spironolactone and wonders if this has something to do with persistent nausea/vomiting.  Yesterday when she was feeling sick, dizzy/sweaty, BP was 149/99.  She reports her systolic pressures were in the 60s when she arrived.  She has lost 10 pounds over the past 3 to 4 months.  She denies abdominal pain.  At baseline she has 4-5 loose stools daily.  No solid stools.  No melena or rectal bleeding.  She has sensation of food and liquid sticking when she eats.  She takes Imodium as needed for diarrhea, cannot take regularly or she actually gets constipated.  Back in August, she developed vomiting after eating pork.  She had reported prior tick bites and there was a concern  that she could have alpha gal.  Has not completed appropriate testing.  For the most part she does not consume beef or pork regularly.  Typically consumes chicken and salmon.  Denies rash or swelling associated with meals.  No known food allergies.  No family history of celiac disease or IBD.  EGD during August hospitalization, found to have H. pylori gastritis, completed Pylera equivalent.  In the ED: She was given IV fluids for hypotension.  Systolic blood pressures initially in the 70s.  Blood pressure gradually responded.    CT abdomen pelvis with contrast showed scattered subcentimeter hypodense lesions seen in the liver, largest 1 in the liver dome measuring 8 mm.  No new focal liver lesions since CT last month.  Nonemergent outpatient MRI recommended.  CT with contrast in October 2020 described 9 mm hemangioma in the hepatic dome.  Similar sized nonspecific hypodensities in the liver on CT in 2018 as well.  Potassium 3.2, creatinine 1.05 on arrival.  Random cortisol 43.4, lipase chronically mildly elevated, currently 67, LFTs unremarkable.  SARS coronavirus 2 negative.  CBC normal.   Prior work-up: Inpatient August 2022, evaluated by GI for nausea/vomiting. EGD completed by Dr. Owens Loffler.  She was found to have H. pylori gastritis.  Treated with 10-day course of Pylera equivalent.  EGD August 2022 for nausea and vomiting: -Mild, non-specific distal gastritis. Biopsies taken to check for H. pylori, were positive (treated with 10-day course of Pylera equivalent) - The examination was otherwise normal.  EGD May 2020 for dysphagia: -Small hiatal hernia -Normal stomach and duodenum to D2 -  Empiric Maloney dilation to 62 [place] with moderate resistance  Colonoscopy May 2020 for diarrhea: -Submucosal nodule in the ascending colon consistent with lipoma -Distal 5 cm of TI normal -Segmental right and left colon biopsies negative for microscopic colitis -Next colonoscopy in 10 years for  screening purposes  Prior to Admission medications   Medication Sig Start Date End Date Taking? Authorizing Provider  ALPRAZolam Duanne Moron) 1 MG tablet Take 1 mg by mouth See admin instructions. Take 1 mg by mouth at bedtime and an additional 1 mg daily as needed for anxiety   Yes [provider]  aspirin EC 81 MG tablet Take 81 mg by mouth every evening.    Yes [provider]  atorvastatin (LIPITOR) 80 MG tablet Take 1 tablet (80 mg total) by mouth daily at 6 PM. 11/10/20  Yes Larey Dresser, MD  carvedilol (COREG) 3.125 MG tablet Take 1 tablet (3.125 mg total) by mouth 2 (two) times daily. 02/08/21 05/09/21 Yes Clegg, Amy D, NP  Cholecalciferol (VITAMIN D-3 PO) Take 1 capsule by mouth daily with breakfast.   Yes [provider]  clopidogrel (PLAVIX) 75 MG tablet Take 1 tablet (75 mg total) by mouth daily. 10/27/20  Yes Louk, Alexandra M, PA-C  dapagliflozin propanediol (FARXIGA) 10 MG TABS tablet Take 1 tablet (10 mg total) by mouth daily before breakfast. 01/25/21  Yes Milford, Maricela Bo, FNP  ezetimibe (ZETIA) 10 MG tablet Take 1 tablet (10 mg total) by mouth daily. 07/07/20  Yes Larey Dresser, MD  furosemide (LASIX) 20 MG tablet Take 1 tablet (20 mg total) by mouth daily. 01/08/21 04/08/21 Yes Larey Dresser, MD  gabapentin (NEURONTIN) 100 MG capsule Take 200 mg by mouth at bedtime.   Yes [provider]  loperamide (IMODIUM) 2 MG capsule Take 2 mg by mouth 4 (four) times daily as needed for diarrhea or loose stools (ibs).    Yes [provider]  Multiple Vitamins-Minerals (MULTIVITAMIN WITH MINERALS) tablet Take 1 tablet by mouth daily.   Yes [provider]  nicotine (NICODERM CQ - DOSED IN MG/24 HOURS) 21 mg/24hr patch Place 21 mg onto the skin daily.   Yes [provider]  nitroGLYCERIN (NITROSTAT) 0.4 MG SL tablet Place 1 tablet (0.4 mg total) under the tongue every 5 (five) minutes x 3 doses as needed for chest pain (if no relief  after 2nd dose, proceed to the ED for an evaluation or call 911). 09/01/20  Yes Verta Ellen., NP          rOPINIRole (REQUIP) 0.5 MG tablet Take 0.5 mg by mouth at bedtime.  11/13/17  Yes [provider]  spironolactone (ALDACTONE) 25 MG tablet Take 1 tablet (25 mg total) by mouth at bedtime. 02/08/21 05/09/21 Yes Clegg, Amy D, NP  zinc gluconate 50 MG tablet Take 50 mg by mouth daily.   Yes [provider]    Current Facility-Administered Medications  Medication Dose Route Frequency Provider Last Rate Last Admin   ALPRAZolam (XANAX) tablet 1 mg  1 mg Oral QHS Elgergawy, Silver Huguenin, MD   1 mg at 02/18/21 2207   And   ALPRAZolam (XANAX) tablet 1 mg  1 mg Oral Daily PRN Elgergawy, Silver Huguenin, MD       aspirin EC tablet 81 mg  81 mg Oral QPM Elgergawy, Silver Huguenin, MD   81 mg at 02/18/21 2007   atorvastatin (LIPITOR) tablet 80 mg  80 mg Oral q1800 Elgergawy, Silver Huguenin, MD  clopidogrel (PLAVIX) tablet 75 mg  75 mg Oral Daily Elgergawy, Silver Huguenin, MD   75 mg at 02/19/21 8889   dapagliflozin propanediol (FARXIGA) tablet 10 mg  10 mg Oral QAC breakfast Elgergawy, Silver Huguenin, MD       enoxaparin (LOVENOX) injection 40 mg  40 mg Subcutaneous Q24H Elgergawy, Silver Huguenin, MD       loperamide (IMODIUM) capsule 2 mg  2 mg Oral QID PRN Elgergawy, Silver Huguenin, MD       nicotine (NICODERM CQ - dosed in mg/24 hours) patch 21 mg  21 mg Transdermal Daily Elgergawy, Silver Huguenin, MD   21 mg at 02/19/21 0853   pantoprazole (PROTONIX) EC tablet 40 mg  40 mg Oral Daily Elgergawy, Silver Huguenin, MD   40 mg at 02/19/21 0853   rOPINIRole (REQUIP) tablet 0.5 mg  0.5 mg Oral QHS Elgergawy, Silver Huguenin, MD   0.5 mg at 02/18/21 2207    Allergies as of 02/18/2021 - Review Complete 02/18/2021  Allergen Reaction Noted   Tape Other (See Comments) 01/03/2017    Past Medical History:  Diagnosis Date   Anxiety state 03/25/2016   Basal cell carcinoma of forehead    Brain aneurysm    CHF (congestive heart failure) ([place])     Coronary artery disease    a. 03/11/16 PCI with DES-->Prox/Mid Cx;  b. 03/14/16 PCI with DES x2-->RCA, EF 30-35%.   Essential hypertension    GERD (gastroesophageal reflux disease)    HFrEF (heart failure with reduced ejection fraction) ([place])    a. 10/2016 Echo: EF 35-40%, Gr1 DD, mild focal basal septal hypertrophy, basal inflat, mid inflat, basal antlat AK. Mid infept/inf/antlat, apical lateral sev HK. Mod MR. mildly reduced RV fxn. Mild TR.   History of pneumonia    Hyperlipidemia    IBS (irritable bowel syndrome)    Ischemic cardiomyopathy    a. 10/2016 Echo: EF 35-40%, Gr1 DD.   Mitral regurgitation    NSTEMI (non-ST elevated myocardial infarction) ([place]) 03/10/2016   Pneumonia 03/2016   Squamous cell cancer of skin of nose    Thrombocytosis 03/26/2016   Tobacco abuse    Trichimoniasis    Wears dentures    Wears glasses     Past Surgical History:  Procedure Laterality Date   APPENDECTOMY     BIOPSY  09/20/2018   Procedure: BIOPSY;  Surgeon: Daneil Dolin, MD;  [facility];  Service: Endoscopy;;  colon   BIOPSY  01/05/2021   Procedure: BIOPSY;  Surgeon: Milus Banister, MD;  Location[facility] END[facility];  Service: Endoscopy;;   CARDIAC CATHETERIZATION N/A 03/11/2016   Procedure: Left Heart Cath and Coronary Angiography;  Surgeon: Leonie Man, MD;  Location: [place] [facility] LAB;  Service: Cardiovascular;  Laterality: N/A;   CARDIAC CATHETERIZATION N/A 03/11/2016   Procedure: Coronary Stent Intervention;  Surgeon: Leonie Man, MD;  Location: [place] [facility];  Service: Cardiovascular;  Laterality: N/A;   CARDIAC CATHETERIZATION N/A 03/14/2016   Procedure: Coronary Stent Intervention;  Surgeon: Peter M Martinique, MD;  Location: [place] [place] [facility] LAB;  Service: Cardiovascular;  Laterality: N/A;   CHOLECYSTECTOMY OPEN  1984   COLONOSCOPY WITH PROPOFOL N/A 09/20/2018   Procedure: COLONOSCOPY WITH PROPOFOL;  Surgeon: Daneil Dolin, MD;  [facility];  Service:  Endoscopy;  Laterality: N/A;  10:30am   CORONARY ANGIOPLASTY WITH STENT PLACEMENT  03/14/2016   ESOPHAGOGASTRODUODENOSCOPY (EGD) WITH PROPOFOL N/A 09/20/2018   Procedure: ESOPHAGOGASTRODUODENOSCOPY (EGD) WITH PROPOFOL;  Surgeon: Daneil Dolin, MD;  [facility];  Service: Endoscopy;  Laterality: N/A;   ESOPHAGOGASTRODUODENOSCOPY (EGD) WITH PROPOFOL N/A 01/05/2021   Procedure: ESOPHAGOGASTRODUODENOSCOPY (EGD) WITH PROPOFOL;  Surgeon: Milus Banister, MD;  Location: [facility] ENDOSCOPY;  Service: Endoscopy;  Laterality: N/A;   FINGER ARTHROPLASTY Left 05/14/2013   Procedure: LEFT THUMB CARPAL METACARPAL ARTHROPLASTY;  Surgeon: Tennis Must, MD;  Location: [place];  Service: Orthopedics;  Laterality: Left;   ICD IMPLANT N/A 04/03/2020   Procedure: ICD IMPLANT;  Surgeon: Constance Haw, MD;  Location: [place] [facility];  Service: Cardiovascular;  Laterality: N/A;   IR ANGIO INTRA EXTRACRAN SEL COM CAROTID INNOMINATE BILAT MOD SED  01/05/2017   IR ANGIO INTRA EXTRACRAN SEL COM CAROTID INNOMINATE BILAT MOD SED  03/19/2019   IR ANGIO INTRA EXTRACRAN SEL COM CAROTID INNOMINATE BILAT MOD SED  06/04/2020   IR ANGIO INTRA EXTRACRAN SEL INTERNAL CAROTID UNI L MOD SED  04/15/2019   IR ANGIO VERTEBRAL SEL VERTEBRAL BILAT MOD SED  01/05/2017   IR ANGIO VERTEBRAL SEL VERTEBRAL BILAT MOD SED  03/19/2019   IR ANGIO VERTEBRAL SEL VERTEBRAL UNI L MOD SED  06/04/2020   IR ANGIOGRAM FOLLOW UP STUDY  04/15/2019   IR RADIOLOGIST EVAL & MGMT  12/30/2016   IR TRANSCATH/EMBOLIZ  04/15/2019   IR US GUIDE VASC ACCESS RIGHT  03/19/2019   IR US GUIDE VASC ACCESS RIGHT  06/04/2020   MALONEY DILATION N/A 09/20/2018   Procedure: Venia Minks DILATION;  Surgeon: Daneil Dolin, MD;  [facility];  Service: Endoscopy;  Laterality: N/A;   RADIOLOGY WITH ANESTHESIA N/A 04/15/2019   Procedure: [employer];  Surgeon: Luanne Bras, MD;  Location: [place];  Service: Radiology;  Laterality: N/A;    RIGHT/LEFT HEART CATH AND CORONARY ANGIOGRAPHY N/A 08/19/2019   Procedure: RIGHT/LEFT HEART CATH AND CORONARY ANGIOGRAPHY;  Surgeon: Larey Dresser, MD;  Location: [place] [facility];  Service: Cardiovascular;  Laterality: N/A;   TUBAL LIGATION  1987   VAGINAL HYSTERECTOMY  2009    Family History  Problem Relation Age of Onset   Stroke Mother    Hypertension Mother    Diabetes Mother    Diabetes Father    Hypertension Father    CAD Father    Colon polyps Father 68       pre-cancerous    Stroke Father    Dementia Father    Breast cancer Maternal Grandmother    Cancer Maternal Grandfather        Tongue and esophageal   Heart failure Other    Colon cancer Neg Hx     Social History   Socioeconomic History   Marital status: Married    Spouse name: Not on file   Number of children: Not on file   Years of education: Not on file   Highest education level: Not on file  Occupational History   Occupation: CNA  Tobacco Use   Smoking status: Some Days    Packs/day: 0.10    Years: 15.00    Pack years: 1.50    Types: Cigarettes   Smokeless tobacco: Never   Tobacco comments:    smokes a cigarette occasionally  Vaping Use   Vaping Use: Never used  Substance and Sexual Activity   Alcohol use: Yes    Comment: occasionally   Drug use: Not Currently    Types: Marijuana    Comment: former- 2017 last time   Sexual activity: Not  Currently    Birth control/protection: Surgical    Comment: hyst  Other Topics Concern   Not on file  Social History Narrative   Lives with husband in [place] in a one story home with a basement.  Has 4 children.  Works as a Quarry manager.  Education: CNA school.    Social Determinants of Health   Financial Resource Strain: Not on file  Food Insecurity: Not on file  Transportation Needs: Not on file  Physical Activity: Not on file  Stress: Not on file  Social Connections: Not on file  Intimate Partner Violence: Not on file     ROS:  General: Negative for  fever, chills, fatigue see HPI  eyes: Negative for vision changes.  ENT: Negative for hoarseness, nasal congestion.  See HPI CV: Negative for chest pain, angina, palpitations, dyspnea on exertion, peripheral edema.  Respiratory: Negative for dyspnea at rest, dyspnea on exertion, cough, sputum, wheezing.  GI: See history of present illness. GU:  Negative for dysuria, hematuria, urinary incontinence, urinary frequency, nocturnal urination.  MS: Negative for joint pain, low back pain.  Derm: Negative for rash or itching.  Neuro: Negative for weakness, abnormal sensation, seizure, frequent headaches, memory loss, confusion.  Psych: Negative for anxiety, depression, suicidal ideation, hallucinations.  Endo: See HPI.  Heme: Negative for bruising or bleeding. Allergy: Negative for rash or hives.       Physical Examination: Vital signs in last 24 hours: Temp:  [97 F (36.1 C)-98.4 F (36.9 C)] 98.3 F (36.8 C) (10/14 0616) Pulse Rate:  [76-88] 80 (10/14 0616) Resp:  [14-22] 18 (10/13 1900) BP: (77-134)/(54-92) 116/71 (10/14 0616) SpO2:  [99 %-100 %] 100 % (10/14 0616) Weight:  [53.4 kg] 53.4 kg (10/13 1900)    General: Well-nourished, well-developed in no acute distress.  Head: Normocephalic, atraumatic.   Eyes: Conjunctiva pink, no icterus. Mouth: Oropharyngeal mucosa moist and pink , no lesions erythema or exudate. Neck: Supple without thyromegaly, masses, or lymphadenopathy.  Lungs: Clear to auscultation bilaterally.  Heart: Regular rate and rhythm, no murmurs rubs or gallops.  Abdomen: Bowel sounds are normal, nontender, nondistended, no hepatosplenomegaly or masses, no abdominal bruits or    hernia , no rebound or guarding.   Rectal: Not performed Extremities: No lower extremity edema, clubbing, deformity.  Neuro: Alert and oriented x 4 , grossly normal neurologically.  Skin: Warm and dry, no rash or jaundice.   Psych: Alert and cooperative, normal mood and affect.         Intake/Output from previous day: 10/13 0701 - 10/14 0700 In: 240 [P.O.:240] Out: -  Intake/Output this shift: No intake/output data recorded.  Lab Results: CBC Recent Labs    02/18/21 1118 02/19/21 0636  WBC 9.9 7.1  HGB 13.1 12.1  HCT 39.6 37.2  MCV 87.2 90.1  PLT 300 314   BMET Recent Labs    02/18/21 1118 02/19/21 0636  NA 135 140  K 3.2* 4.0  CL 101 112*  CO2 24 22  GLUCOSE 116* 78  BUN 21* 12  CREATININE 1.05* 0.93  CALCIUM 9.0 8.6*   LFT Recent Labs    02/18/21 1119  BILITOT 1.1  BILIDIR 0.1  IBILI 1.0*  ALKPHOS 68  AST 22  ALT 18  PROT 6.7  ALBUMIN 3.8    Lipase Recent Labs    02/18/21 1119  LIPASE 67*   Lab Results  Component Value Date   TSH 0.674 12/10/2019     PT/INR No results for input(s): LABPROT,  INR in the last 72 hours.    Imaging Studies: CT Angio Head W or Wo Contrast  Result Date: 02/18/2021 CLINICAL DATA:  Neuro deficit, stroke suspected EXAM: CT ANGIOGRAPHY HEAD TECHNIQUE: Multidetector CT imaging of the head was performed using the standard protocol during bolus administration of intravenous contrast. Multiplanar CT image reconstructions and MIPs were obtained to evaluate the vascular anatomy. CONTRAST:  166mL OMNIPAQUE IOHEXOL 350 MG/ML SOLN COMPARISON:  12/10/2020 FINDINGS: CT HEAD Brain: Stable left posterior parietal remote infarct. No acute infarct, hemorrhage, hydrocephalus, extra-axial collection, mass, mass effect, or midline shift. Vascular: No hyperdense vessel.  See CTA findings. Skull: Normal. Negative for fracture or focal lesion. Sinuses: No acute or significant finding. Other: None. CTA HEAD Anterior circulation: Redemonstrated distal left ICA stent, which appears patent. No residual aneurysm seen. Both internal carotid arteries are patent to the termini, with atherosclerotic calcifications but without stenosis or other abnormality. A1 segments patent. Normal anterior communicating artery. Anterior cerebral  arteries are patent to their distal aspects. No M1 stenosis or occlusion. Normal MCA bifurcations. Distal MCA branches perfused and symmetric. Posterior circulation: Vertebral arteries widely patent to the vertebrobasilar junction without stenosis. Posterior inferior cerebral arteries patent bilaterally. Basilar patent to its distal aspect. Superior cerebral arteries patent bilaterally. PCAs perfused to their distal aspects without stenosis. Unchanged appearance of the right posterior communicating artery, with infundibulum. The left posterior communicating artery is not visualized. Venous sinuses: As permitted by contrast timing, patent. Anatomic variants: None significant Review of the MIP images confirms the above findings. IMPRESSION: 1. No acute intracranial process. 2. Distal left ICA stent is patent, without residual aneurysm. 3. Stable infundibulum at the origin of the right posterior communicating artery. 4. No large vessel occlusion. Electronically Signed   By: Merilyn Baba M.D.   On: 02/18/2021 15:03   DG Chest 2 View  Result Date: 02/18/2021 CLINICAL DATA:  Chest pain EXAM: CHEST - 2 VIEW COMPARISON:  None. FINDINGS: Cardiomegaly. Left chest single lead pacer defibrillator. Both lungs are clear. The visualized skeletal structures are unremarkable. IMPRESSION: Cardiomegaly without acute abnormality of the lungs. Electronically Signed   By: Delanna Ahmadi M.D.   On: 02/18/2021 11:12   CT ABDOMEN PELVIS W CONTRAST  Result Date: 02/18/2021 CLINICAL DATA:  Abdominal pain, hypotension, headache EXAM: CT ABDOMEN AND PELVIS WITH CONTRAST TECHNIQUE: Multidetector CT imaging of the abdomen and pelvis was performed using the standard protocol following bolus administration of intravenous contrast. CONTRAST:  185mL OMNIPAQUE IOHEXOL 350 MG/ML SOLN COMPARISON:  CT abdomen/pelvis 01/25/2021 FINDINGS: Lower chest: The lung bases are clear. Cardiac device leads are partially imaged. A trace pericardial  effusion is unchanged. Hepatobiliary: Scattered subcentimeter hypodense lesions are again seen in the liver, the largest in the liver dome measuring 8 mm, unchanged. There are no new focal lesions. The gallbladder is surgically absent. Prominence of the extrahepatic bile ducts is unchanged there is no intrahepatic biliary ductal dilatation. Pancreas: Unremarkable. Spleen: Unremarkable. Adrenals/Urinary Tract: A subcentimeter right adrenal nodule is unchanged since 2018. 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 There are no focal lesions or stones. There is no hydronephrosis or hydroureter. The bladder is unremarkable. Stomach/Bowel: The stomach is unremarkable. There is no evidence of bowel obstruction. There is no abnormal bowel wall thickening or inflammatory change. Vascular/Lymphatic: There is extensive soft and calcified atherosclerotic plaque throughout the abdominal aorta with  thick mural thrombus in the infrarenal abdominal aorta. There is bulging of the aorta measuring up to 2.6 cm in maximum diameter without evidence of frank aneurysmal dilation. The appearance is unchanged. The major branch vessels are patent with mild stenosis at the origin of the celiac artery. The main portal and splenic veins are patent. There is no abdominal or pelvic lymphadenopathy. Reproductive: The uterus is surgically absent. There is no adnexal mass. Other: There is no ascites or free air. Musculoskeletal: There is no acute osseous abnormality. IMPRESSION: 1. No acute findings in the abdomen or pelvis. Overall no significant interval change since 02/04/2021. 2. Unchanged small hypodense lesion in the liver dome, incompletely characterized. As before, nonemergent outpatient MRI can be considered for characterization. 3. Unchanged trace pericardial effusion.  Aortic Atherosclerosis (ICD10-I70.0). Electronically Signed   By: Valetta Mole M.D.   On: 02/18/2021 15:16   CT Abdomen Pelvis W Contrast  Result Date: 01/25/2021 CLINICAL DATA:  Acute abdominal pain, lawn localized. Lower abdominal and bilateral flank pain. Nausea. EXAM: CT ABDOMEN AND PELVIS WITH CONTRAST TECHNIQUE: Multidetector CT imaging of the abdomen and pelvis was performed using the standard protocol following bolus administration of intravenous contrast. CONTRAST:  34mL OMNIPAQUE IOHEXOL 350 MG/ML SOLN COMPARISON:  01/04/2021 FINDINGS: Lower chest: Lung bases are clear. Trace pericardial effusion. Mild cardiac enlargement. Hepatobiliary: 8 mm low-attenuation lesion in the dome of the liver, not definitely present previously. This is too small for accurate characterization. Consider follow-up with elective MRI to exclude developing solid nodule. Gallbladder is surgically absent. No bile duct dilatation. Pancreas: Unremarkable. No pancreatic ductal dilatation or surrounding inflammatory changes. Spleen: Normal in size without focal abnormality. Adrenals/Urinary Tract: Left adrenal gland nodule measuring 2.6 cm diameter. No change since prior study. Subcentimeter right adrenal gland nodule is also unchanged. Bilateral renal parenchymal scarring. Nephrograms are otherwise homogeneous. Aberrant rotation of the kidneys. No hydronephrosis or hydroureter. Bladder is unremarkable. Stomach/Bowel: Stomach, Small bowel, and colon are not abnormally distended. No wall thickening or inflammatory changes are apparent. Scattered stool throughout the colon. Appendix is not identified. Vascular/Lymphatic: Calcification of the aorta with focal mural thrombus. No significant aneurysm. No significant lymphadenopathy. Reproductive: Uterus is surgically absent. No abnormal pelvic masses. Other: No free air or free fluid in the abdomen. Abdominal wall musculature appears intact. Musculoskeletal: No acute or significant  osseous findings. IMPRESSION: 1. 8 mm low-attenuation in the dome of the liver, not definitely present previously. Two small fracture characterization. When the patient is clinically stable and able to follow directions and hold their breath (preferably as an outpatient) further evaluation with dedicated abdominal MRI should be considered. 2. No evidence of bowel obstruction or inflammation. 3. Aortic atherosclerosis. Electronically Signed   By: Lucienne Capers M.D.   On: 01/25/2021 19:16  [4 week]   Impression: Pleasant 60 year old female with complicated past medical history as outlined above, recent admission in August for recurrent nausea/vomiting with a EGD showing H. pylori gastritis, presenting with headache, dizziness/diaphoresis, vomiting.  GI consulted for liver lesion, nausea/vomiting/diarrhea, rule out carcinoid.  Chronic intermittent vomiting associated with poor appetite, 10 pound weight loss: Symptoms present for 3 to 4 months.  Initially she felt like her symptoms could be due to alpha gal due to having multiple tick bites.  Initial hospitalization for symptoms in August, episodes of vomiting occurred after consuming pork.  EGD at that time with H. pylori gastritis, she has completed treatment.  We will need to document eradication with H. pylori stool antigen after off PPI/antibiotics  for 2 weeks.  Patient states she is concerned her nausea/vomiting is brought on by hypotension.  She wonders if her medications are contributing to it.  Did not have these symptoms prior to June or July of this year.  No associated abdominal pain.  CT abdomen/pelvis imaging x3 since August.  Random cortisol level normal.  At this time would like to rule out alpha gal, adrenal insufficiency, thyroid dysfunction.  No indication for repeat upper endoscopy.  Chronic diarrhea: She has had this for years.  Previous colonoscopy without evidence of IBD or microscopic colitis.  Recent EGD with normal-appearing duodenum  but no biopsies taken.  Check celiac serologies to be complete.  Unlikely dealing with carcinoid tumor but remains on differential.  Liver lesions: Most dominant liver lesion in the dome of the liver appears to be present back in 2018 as well.  Appears to be stable in size.  2020 CT reported likely benign hepatic hemangioma.  Ideally could be evaluated by MRI, need to confirm if she is candidate given her ICD placement. Can be completed as outpatient.   Plan: Check for H. pylori eradication as an outpatient. Alpha gal panel. TTG IgA, serum IgA Fasting a.m. cortisol TSH 24 hour 5HIAA Further management of liver lesions as outpatient. Supportive measures.   We would like to thank you for the opportunity to participate in the care of Erin [name].  Laureen Ochs. Bernarda Caffey [facility] [phone] 10/14/202211:48 AM    LOS: 0 days

## 2021-02-20 ENCOUNTER — Observation Stay (HOSPITAL_COMMUNITY): Payer: 59

## 2021-02-20 DIAGNOSIS — K529 Noninfective gastroenteritis and colitis, unspecified: Secondary | ICD-10-CM

## 2021-02-20 DIAGNOSIS — I5042 Chronic combined systolic (congestive) and diastolic (congestive) heart failure: Secondary | ICD-10-CM | POA: Diagnosis not present

## 2021-02-20 DIAGNOSIS — I255 Ischemic cardiomyopathy: Secondary | ICD-10-CM | POA: Diagnosis not present

## 2021-02-20 DIAGNOSIS — I10 Essential (primary) hypertension: Secondary | ICD-10-CM | POA: Diagnosis not present

## 2021-02-20 DIAGNOSIS — R112 Nausea with vomiting, unspecified: Secondary | ICD-10-CM | POA: Diagnosis not present

## 2021-02-20 LAB — CBC WITH DIFFERENTIAL/PLATELET
Abs Immature Granulocytes: 0.03 10*3/uL (ref 0.00–0.07)
Basophils Absolute: 0.1 10*3/uL (ref 0.0–0.1)
Basophils Relative: 1 %
Eosinophils Absolute: 0.1 10*3/uL (ref 0.0–0.5)
Eosinophils Relative: 1 %
HCT: 36.3 % (ref 36.0–46.0)
Hemoglobin: 11.5 g/dL — ABNORMAL LOW (ref 12.0–15.0)
Immature Granulocytes: 1 %
Lymphocytes Relative: 34 %
Lymphs Abs: 2.3 10*3/uL (ref 0.7–4.0)
MCH: 28.3 pg (ref 26.0–34.0)
MCHC: 31.7 g/dL (ref 30.0–36.0)
MCV: 89.2 fL (ref 80.0–100.0)
Monocytes Absolute: 0.5 10*3/uL (ref 0.1–1.0)
Monocytes Relative: 8 %
Neutro Abs: 3.7 10*3/uL (ref 1.7–7.7)
Neutrophils Relative %: 55 %
Platelets: 265 10*3/uL (ref 150–400)
RBC: 4.07 MIL/uL (ref 3.87–5.11)
RDW: 14.6 % (ref 11.5–15.5)
WBC: 6.6 10*3/uL (ref 4.0–10.5)
nRBC: 0 % (ref 0.0–0.2)

## 2021-02-20 LAB — COMPREHENSIVE METABOLIC PANEL
ALT: 15 U/L (ref 0–44)
AST: 16 U/L (ref 15–41)
Albumin: 3.2 g/dL — ABNORMAL LOW (ref 3.5–5.0)
Alkaline Phosphatase: 57 U/L (ref 38–126)
Anion gap: 8 (ref 5–15)
BUN: 11 mg/dL (ref 6–20)
CO2: 21 mmol/L — ABNORMAL LOW (ref 22–32)
Calcium: 9.3 mg/dL (ref 8.9–10.3)
Chloride: 111 mmol/L (ref 98–111)
Creatinine, Ser: 0.92 mg/dL (ref 0.44–1.00)
GFR, Estimated: >60 mL/min (ref >60)
Glucose, Bld: 80 mg/dL (ref 70–99)
Potassium: 3.9 mmol/L (ref 3.5–5.1)
Sodium: 140 mmol/L (ref 135–145)
Total Bilirubin: 1.1 mg/dL (ref 0.3–1.2)
Total Protein: 5.8 g/dL — ABNORMAL LOW (ref 6.5–8.1)

## 2021-02-20 LAB — CORTISOL-AM, BLOOD: Cortisol - AM: 5.9 ug/dL — ABNORMAL LOW (ref 6.7–22.6)

## 2021-02-20 MED ORDER — FUROSEMIDE 20 MG PO TABS
20.0000 mg | ORAL_TABLET | Freq: Once | ORAL | Status: AC
  Administered 2021-02-20: 20 mg via ORAL
  Filled 2021-02-20: qty 1

## 2021-02-20 MED ORDER — FUROSEMIDE 10 MG/ML IJ SOLN: 20.0000 mg | Freq: Every day | INTRAMUSCULAR | Status: DC

## 2021-02-20 MED ORDER — NITROGLYCERIN 0.4 MG SL SUBL: 0.4000 mg | SUBLINGUAL_TABLET | SUBLINGUAL | Status: DC | PRN

## 2021-02-20 MED ORDER — METOCLOPRAMIDE HCL 5 MG/ML IJ SOLN
10.0000 mg | Freq: Once | INTRAMUSCULAR | Status: AC
  Administered 2021-02-20: 10 mg via INTRAVENOUS
  Filled 2021-02-20: qty 2

## 2021-02-20 MED ORDER — ACETAMINOPHEN 325 MG PO TABS
650.0000 mg | ORAL_TABLET | Freq: Four times a day (QID) | ORAL | Status: DC | PRN
  Administered 2021-02-20: 650 mg via ORAL
  Filled 2021-02-20: qty 2

## 2021-02-20 MED ORDER — GABAPENTIN 100 MG PO CAPS
200.0000 mg | ORAL_CAPSULE | Freq: Every day | ORAL | Status: DC
  Administered 2021-02-20 – 2021-02-21 (×2): 200 mg via ORAL
  Filled 2021-02-20 (×3): qty 2

## 2021-02-20 MED ORDER — DIPHENHYDRAMINE HCL 50 MG/ML IJ SOLN
12.5000 mg | Freq: Once | INTRAMUSCULAR | Status: AC
  Administered 2021-02-20: 12.5 mg via INTRAVENOUS
  Filled 2021-02-20: qty 1

## 2021-02-20 MED ORDER — KETOROLAC TROMETHAMINE 15 MG/ML IJ SOLN
15.0000 mg | Freq: Once | INTRAMUSCULAR | Status: AC
  Administered 2021-02-20: 15 mg via INTRAVENOUS
  Filled 2021-02-20: qty 1

## 2021-02-20 MED ORDER — EZETIMIBE 10 MG PO TABS
10.0000 mg | ORAL_TABLET | Freq: Every day | ORAL | Status: DC
  Administered 2021-02-20 – 2021-02-22 (×3): 10 mg via ORAL
  Filled 2021-02-20 (×3): qty 1

## 2021-02-20 MED ORDER — FUROSEMIDE 20 MG PO TABS
20.0000 mg | ORAL_TABLET | Freq: Every day | ORAL | Status: DC
  Administered 2021-02-20: 20 mg via ORAL
  Filled 2021-02-20: qty 1

## 2021-02-20 MED ORDER — CARVEDILOL 3.125 MG PO TABS
3.1250 mg | ORAL_TABLET | Freq: Two times a day (BID) | ORAL | Status: DC
  Administered 2021-02-20 – 2021-02-22 (×5): 3.125 mg via ORAL
  Filled 2021-02-20 (×5): qty 1

## 2021-02-20 NOTE — Progress Notes (Signed)
Erin [name], M.D. Gastroenterology & Hepatology   Interval History: No acute events overnight. Patient reports that she is still feeling slightly nauseated but has not vomited anymore.  Only had 1 bowel movement yesterday.  Denies having any abdominal pain but has occasional discomfort in the lower lower abdomen.  Has tolerated diet adequately today. Notably, her morning cortisol came back decreased 5.9.  Inpatient Medications:  Current Facility-Administered Medications:    ALPRAZolam (XANAX) tablet 1 mg, 1 mg, Oral, QHS, 1 mg at 02/19/21 2110 **AND** ALPRAZolam (XANAX) tablet 1 mg, 1 mg, Oral, Daily PRN, Elgergawy, Silver Huguenin, MD   aspirin EC tablet 81 mg, 81 mg, Oral, QPM, Elgergawy, Silver Huguenin, MD, 81 mg at 02/19/21 1737   atorvastatin (LIPITOR) tablet 80 mg, 80 mg, Oral, q1800, Elgergawy, Silver Huguenin, MD, 80 mg at 02/19/21 1737   clopidogrel (PLAVIX) tablet 75 mg, 75 mg, Oral, Daily, Elgergawy, Silver Huguenin, MD, 75 mg at 02/20/21 0109   dapagliflozin propanediol (FARXIGA) tablet 10 mg, 10 mg, Oral, QAC breakfast, Elgergawy, Silver Huguenin, MD, 10 mg at 02/20/21 0846   enoxaparin (LOVENOX) injection 40 mg, 40 mg, Subcutaneous, Q24H, Elgergawy, Silver Huguenin, MD, 40 mg at 02/19/21 1738   ezetimibe (ZETIA) tablet 10 mg, 10 mg, Oral, Daily, Alma Friendly, MD, 10 mg at 02/20/21 3235   furosemide (LASIX) tablet 20 mg, 20 mg, Oral, Daily, Alma Friendly, MD, 20 mg at 02/20/21 5732   gabapentin (NEURONTIN) capsule 200 mg, 200 mg, Oral, QHS, Alma Friendly, MD   loperamide (IMODIUM) capsule 2 mg, 2 mg, Oral, QID PRN, Elgergawy, Silver Huguenin, MD   nicotine (NICODERM CQ - dosed in mg/24 hours) patch 21 mg, 21 mg, Transdermal, Daily, Elgergawy, Silver Huguenin, MD, 21 mg at 02/20/21 0847   nitroGLYCERIN (NITROSTAT) SL tablet 0.4 mg, 0.4 mg, Sublingual, Q5 Min x 3 PRN, Alma Friendly, MD   pantoprazole (PROTONIX) EC tablet 40 mg, 40 mg, Oral, Daily, Elgergawy, Silver Huguenin, MD, 40 mg at 02/20/21 0838    rOPINIRole (REQUIP) tablet 0.5 mg, 0.5 mg, Oral, QHS, Elgergawy, Silver Huguenin, MD, 0.5 mg at 02/19/21 2110   I/O    Intake/Output Summary (Last 24 hours) at 02/20/2021 0907 Last data filed at 02/20/2021 0823 Gross per 24 hour  Intake 1520 ml  Output --  Net 1520 ml     Physical Exam: Temp:  [98 F (36.7 C)-98.4 F (36.9 C)] 98.2 F (36.8 C) (10/15 0514) Pulse Rate:  [73-79] 73 (10/15 0514) Resp:  [18] 18 (10/14 2155) BP: (100-131)/(64-83) 131/83 (10/15 0514) SpO2:  [98 %-100 %] 100 % (10/15 0514)  Temp (24hrs), Avg:98.2 F (36.8 C), Min:98 F (36.7 C), Max:98.4 F (36.9 C) GENERAL: The patient is AO x3, in no acute distress. Looks frail. HEENT: Head is normocephalic and atraumatic. EOMI are intact. Mouth is well hydrated and without lesions. NECK: Supple. No masses LUNGS: Clear to auscultation. No presence of rhonchi/wheezing/rales. Adequate chest expansion HEART: RRR, normal s1 and s2. ABDOMEN: Soft, nontender, no guarding, no peritoneal signs, and nondistended. BS +. No masses. EXTREMITIES: Without any cyanosis, clubbing, rash, lesions or edema. NEUROLOGIC: AOx3, no focal motor deficit. SKIN: no jaundice, no rashes  Laboratory Data: CBC:     Component Value Date/Time   WBC 6.6 02/20/2021 0457   RBC 4.07 02/20/2021 0457   HGB 11.5 (L) 02/20/2021 0457   HCT 36.3 02/20/2021 0457   PLT 265 02/20/2021 0457   MCV 89.2 02/20/2021 0457   MCH 28.3 02/20/2021 0457  MCHC 31.7 02/20/2021 0457   RDW 14.6 02/20/2021 0457   LYMPHSABS 2.3 02/20/2021 0457   MONOABS 0.5 02/20/2021 0457   EOSABS 0.1 02/20/2021 0457   BASOSABS 0.1 02/20/2021 0457   COAG:  Lab Results  Component Value Date   INR 0.9 10/19/2020   INR 0.9 06/04/2020   INR 0.9 04/15/2019    BMP:  BMP Latest Ref Rng & Units 02/20/2021 02/19/2021 02/18/2021  Glucose 70 - 99 mg/dL 80 78 116(H)  BUN 6 - 20 mg/dL 11 12 21(H)  Creatinine 0.44 - 1.00 mg/dL 0.92 0.93 1.05(H)  BUN/Creat Ratio 9 - 23 - - -  Sodium 135  - 145 mmol/L 140 140 135  Potassium 3.5 - 5.1 mmol/L 3.9 4.0 3.2(L)  Chloride 98 - 111 mmol/L 111 112(H) 101  CO2 22 - 32 mmol/L 21(L) 22 24  Calcium 8.9 - 10.3 mg/dL 9.3 8.6(L) 9.0    HEPATIC:  Hepatic Function Latest Ref Rng & Units 02/20/2021 02/18/2021 01/25/2021  Total Protein 6.5 - 8.1 g/dL 5.8(L) 6.7 7.0  Albumin 3.5 - 5.0 g/dL 3.2(L) 3.8 3.7  AST 15 - 41 U/L 16 22 18   ALT 0 - 44 U/L 15 18 12   Alk Phosphatase 38 - 126 U/L 57 68 69  Total Bilirubin 0.3 - 1.2 mg/dL 1.1 1.1 0.7  Bilirubin, Direct 0.0 - 0.2 mg/dL - 0.1 -    CARDIAC:  Lab Results  Component Value Date   TROPONINI <0.03 10/23/2018      Imaging: I personally reviewed and interpreted the available labs, imaging and endoscopic files.   Assessment/Plan: 60 year old female with multiple comorbidities including C. difficile in 2019, ischemic cardiomyopathy, coronary artery disease status post stent placement on dual antiplatelet therapy, ICD placement, left posterior [place] artery aneurysm status post embolization, who came to the hospital after presenting worsening nausea and vomiting.  The patient had a similar presentation back in May 2022 for which she was admitted to [facility] and underwent an EGD that did not show any alterations besides H. pylori gastritis that was successfully treated.  However, this time she presented significant hypotension upon admission.  The patient has tolerated food intake for the last 24 hours and has not presented any more vomiting episodes.  She had presence of some liver lesions in the past which raised the concern for possible carcinoid as she presented some episodes of flushing and shortness of breath but this is not clear yet.  She is currently collecting 24-hour urine sample to check 5 HIAA levels.  Notably, her serum morning cortisol came back low which raises the concern for possible adrenal insufficiency as a cause of her symptoms, for which she may need to have an evaluation with  cosyntropin stimulation test -May need to get endocrinology input for this.TSH was within normal limits, pending TTG IgA titer.  For now, will recommend advancing diet as tolerated continue with supportive treatment with Zofran as needed.  - F/u 5-HIAA in urine and TTG IgA titer - Consider cosyntropin stimulation test -May need to get endocrinology input for this. - Zofran as needed for nausea - Advance diet as tolerated  Erin Peppers, MD Gastroenterology and Hepatology [facility]

## 2021-02-20 NOTE — Progress Notes (Signed)
PROGRESS NOTE  Erin [name] OEV:[id number] DOB: 30-Sep-1960 DOA: 02/18/2021 PCP: [name], MD  HPI/Recap of past 24 hours: Erin [name]  is a 60 y.o. female, with PMH significant for ischemic cardiomyopathy s/p ICD, (Echo 03/2020 EF 30-35% with G2DD), CAD (S/P DES to proximal Lcx and PDA, last cath 08/2019 revealing patent stents and minimal disease), posterior communicating artery brain aneurysm (S/P embolization 04/2019), GERD, nicotine dependence, presents to ED 2/2 complaints of nausea, vomiting and diarrhea, as well headache. Pt also reports dizziness, diaphoresis with presyncope. Of note, pt with recent hospitalization at [facility] in late August for similar complaints, where she had endoscopy significant for gastritis, as well her CT abdomen was significant for liver lesions of unknown etiology. Pt reports that since her BP meds were  restarted about 2 weeks ago, she has been feeling weak. In the ED, noted to be hypotensive, SBP in the 70s, she received total of 2 L fluid bolus with improvement. CT abdomen pelvis was significant for liver lesions, with no significant change in size. Labs fairly stable. [facility] consulted to admit.    Today, pt still reports nausea, but no vomiting, generalized weakness. Was able to tolerate dinner, but not so much of breakfast this morning. BP improved off IVF, will restart her BP meds gradually and monitor BP.   Assessment/Plan: Active Problems:   Essential hypertension   Cardiomyopathy, ischemic   Coronary artery disease involving native coronary artery of native heart   Chronic combined systolic and diastolic congestive heart failure (HCC)   Chronic diarrhea   Intractable nausea and vomiting   Intractable nausea and vomiting Improving Pt attributes her overall symptoms to hypotension 2/2 BP meds EGD done in 12/2020 showed H.pylori gastritis and completed treatment CT abdomen pelvis was significant for liver lesions, with  no significant change in size, CT head done given hx of aneurysm (negative for any intracranial abnormality) GI consulted, appreciate recs with extensive work up, outpt MRI (has ICD) S/P IVF Supportive management, advance diet as tolerated   Hypotension- resolved Weakness/dizziness BP improved s/p IVF Likely 2/2 nausea, vomiting, and antihypertensive regimen S/P IVF Restart her BP meds gradually and monitor BP   Chronic combined systolic and diastolic CHF Currently appears euvolemic CXR done 02/20/21- no pulm edema or consolidation Most recent echo of November 2021 with EF 30% Continue to hold Coreg, Lasix, Aldactone, and gradually restart pending BP   History of CAD / HLD Denies any chest pain Trop X 2 neg Continue with aspirin, Plavix, statin   Tobacco abuse Advised to quit      Estimated body mass index is 22.99 kg/m as calculated from the following:   Height as of this encounter: 5' (1.524 m).   Weight as of this encounter: 53.4 kg.     Code Status: Full  Family Communication: None at bedside  Disposition Plan: Status is: Observation  The patient will require care spanning > 2 midnights and should be moved to inpatient because: Level of care    Consultants: GI  Procedures: None  Antimicrobials: None  DVT prophylaxis: Lovenox   Objective: Vitals:   02/19/21 0616 02/19/21 1342 02/19/21 2155 02/20/21 0514  BP: 116/71 115/72 100/64 131/83  Pulse: 80 79 76 73  Resp:  18 18   Temp: 98.3 F (36.8 C) 98.4 F (36.9 C) 98 F (36.7 C) 98.2 F (36.8 C)  TempSrc: Oral Oral Oral Oral  SpO2: 100% 100% 98% 100%  Weight:  Height:        Intake/Output Summary (Last 24 hours) at 02/20/2021 1119 Last data filed at 02/20/2021 1694 Gross per 24 hour  Intake 1520 ml  Output --  Net 1520 ml   Filed Weights   02/18/21 1900  Weight: 53.4 kg    Exam: General: NAD  Cardiovascular: S1, S2 present Respiratory: CTAB Abdomen: Soft, nontender,  nondistended, bowel sounds present Musculoskeletal: No bilateral pedal edema noted Skin: Normal Psychiatry: Normal mood     Data Reviewed: CBC: Recent Labs  Lab 02/18/21 1118 02/19/21 0636 02/20/21 0457  WBC 9.9 7.1 6.6  NEUTROABS  --   --  3.7  HGB 13.1 12.1 11.5*  HCT 39.6 37.2 36.3  MCV 87.2 90.1 89.2  PLT 300 314 503   Basic Metabolic Panel: Recent Labs  Lab 02/15/21 0825 02/18/21 1118 02/18/21 1217 02/19/21 0636 02/20/21 0457  NA 139 135  --  140 140  K 4.2 3.2*  --  4.0 3.9  CL 101 101  --  112* 111  CO2 22 24  --  22 21*  GLUCOSE 95 116*  --  78 80  BUN 26* 21*  --  12 11  CREATININE 1.32* 1.05*  --  0.93 0.92  CALCIUM 9.9 9.0  --  8.6* 9.3  MG  --   --  2.2  --   --    GFR: Estimated Creatinine Clearance: 47.9 mL/min (by C-G formula based on SCr of 0.92 mg/dL). Liver Function Tests: Recent Labs  Lab 02/18/21 1119 02/20/21 0457  AST 22 16  ALT 18 15  ALKPHOS 68 57  BILITOT 1.1 1.1  PROT 6.7 5.8*  ALBUMIN 3.8 3.2*   Recent Labs  Lab 02/18/21 1119  LIPASE 67*   No results for input(s): AMMONIA in the last 168 hours. Coagulation Profile: No results for input(s): INR, PROTIME in the last 168 hours. Cardiac Enzymes: No results for input(s): CKTOTAL, CKMB, CKMBINDEX, TROPONINI in the last 168 hours. BNP (last 3 results) No results for input(s): PROBNP in the last 8760 hours. HbA1C: No results for input(s): HGBA1C in the last 72 hours. CBG: No results for input(s): GLUCAP in the last 168 hours. Lipid Profile: No results for input(s): CHOL, HDL, LDLCALC, TRIG, CHOLHDL, LDLDIRECT in the last 72 hours. Thyroid Function Tests: Recent Labs    02/19/21 1216  TSH 0.424   Anemia Panel: No results for input(s): VITAMINB12, FOLATE, FERRITIN, TIBC, IRON, RETICCTPCT in the last 72 hours. Urine analysis:    Component Value Date/Time   COLORURINE STRAW (A) 02/18/2021 1743   APPEARANCEUR CLEAR 02/18/2021 1743   LABSPEC 1.022 02/18/2021 1743    PHURINE 6.0 02/18/2021 1743   GLUCOSEU >=500 (A) 02/18/2021 1743   HGBUR NEGATIVE 02/18/2021 [place] 02/18/2021 1743   KETONESUR 5 (A) 02/18/2021 1743   PROTEINUR NEGATIVE 02/18/2021 1743   NITRITE NEGATIVE 02/18/2021 1743   LEUKOCYTESUR NEGATIVE 02/18/2021 1743   Sepsis Labs: @LABRCNTIP (procalcitonin:4,lacticidven:4)  ) Recent Results (from the past 240 hour(s))  Culture, blood (routine x 2)     Status: None (Preliminary result)   Collection Time: 02/18/21 12:16 PM   Specimen: BLOOD RIGHT HAND  Result Value Ref Range Status   Specimen Description BLOOD RIGHT HAND  Final   Special Requests   Final    Blood Culture results may not be optimal due to an inadequate volume of blood received in culture bottles BOTTLES DRAWN AEROBIC ONLY   Culture   Final  NO GROWTH 2 DAYS Performed at [facility], [address], [place], [name] [address]    Report Status PENDING  Incomplete  Culture, blood (routine x 2)     Status: None (Preliminary result)   Collection Time: 02/18/21 12:16 PM   Specimen: Left Antecubital; Blood  Result Value Ref Range Status   Specimen Description LEFT ANTECUBITAL  Final   Special Requests   Final    Blood Culture results may not be optimal due to an excessive volume of blood received in culture bottles BOTTLES DRAWN AEROBIC AND ANAEROBIC   Culture   Final    NO GROWTH 2 DAYS Performed at [facility], [address], [place], [place] [address]    Report Status PENDING  Incomplete  Resp Panel by RT-PCR (Flu A&B, Covid) Nasopharyngeal Swab     Status: None   Collection Time: 02/18/21  5:32 PM   Specimen: Nasopharyngeal Swab; Nasopharyngeal(NP) swabs in vial transport medium  Result Value Ref Range Status   SARS Coronavirus 2 by RT PCR NEGATIVE NEGATIVE Final    Comment: (NOTE) SARS-CoV-2 target nucleic acids are NOT DETECTED.  The SARS-CoV-2 RNA is generally detectable in upper respiratory specimens during the acute phase of infection.  The lowest concentration of SARS-CoV-2 viral copies this assay can detect is 138 copies/mL. A negative result does not preclude SARS-Cov-2 infection and should not be used as the sole basis for treatment or other patient management decisions. A negative result may occur with  improper specimen collection/handling, submission of specimen other than nasopharyngeal swab, presence of viral mutation(s) within the areas targeted by this assay, and inadequate number of viral copies(<138 copies/mL). A negative result must be combined with clinical observations, patient history, and epidemiological information. The expected result is Negative.  Fact Sheet for Patients:  EntrepreneurPulse.com.au  Fact Sheet for Healthcare Providers:  IncredibleEmployment.be  This test is no t yet approved or cleared by the [place] FDA and  has been authorized for detection and/or diagnosis of SARS-CoV-2 by FDA under an Emergency Use Authorization (EUA). This EUA will remain  in effect (meaning this test can be used) for the duration of the COVID-19 declaration under Section 564(b)(1) of the Act, 21 U.S.C.section 360bbb-3(b)(1), unless the authorization is terminated  or revoked sooner.       Influenza A by PCR NEGATIVE NEGATIVE Final   Influenza B by PCR NEGATIVE NEGATIVE Final    Comment: (NOTE) The Xpert Xpress SARS-CoV-2/FLU/RSV plus assay is intended as an aid in the diagnosis of influenza from Nasopharyngeal swab specimens and should not be used as a sole basis for treatment. Nasal washings and aspirates are unacceptable for Xpert Xpress SARS-CoV-2/FLU/RSV testing.  Fact Sheet for Patients: EntrepreneurPulse.com.au  Fact Sheet for Healthcare Providers: IncredibleEmployment.be  This test is not yet approved or cleared by the [place] FDA and has been authorized for detection and/or diagnosis of SARS-CoV-2 by FDA  under an Emergency Use Authorization (EUA). This EUA will remain in effect (meaning this test can be used) for the duration of the COVID-19 declaration under Section 564(b)(1) of the Act, 21 U.S.C. section 360bbb-3(b)(1), unless the authorization is terminated or revoked.  Performed at [facility], [address] [place], [place] [address]       Studies: DG Chest [facility] 1 View  Result Date: 02/20/2021 CLINICAL DATA:  Shortness of breath EXAM: PORTABLE CHEST 1 VIEW COMPARISON:  02/18/2021 FINDINGS: Stable lung aeration with no new consolidation or edema. No pleural effusion or pneumothorax. Stable cardiomediastinal  contours. Left chest wall ICD. IMPRESSION: No acute process in the chest. Electronically Signed   By: Macy Mis M.D.   On: 02/20/2021 09:46    Scheduled Meds:  ALPRAZolam  1 mg Oral QHS   aspirin EC  81 mg Oral QPM   atorvastatin  80 mg Oral q1800   carvedilol  3.125 mg Oral BID   clopidogrel  75 mg Oral Daily   dapagliflozin propanediol  10 mg Oral QAC breakfast   enoxaparin (LOVENOX) injection  40 mg Subcutaneous Q24H   ezetimibe  10 mg Oral Daily   [START ON 02/21/2021] furosemide  20 mg Intravenous Daily   furosemide  20 mg Oral Once   gabapentin  200 mg Oral QHS   nicotine  21 mg Transdermal Daily   pantoprazole  40 mg Oral Daily   rOPINIRole  0.5 mg Oral QHS    Continuous Infusions:   LOS: 0 days     Alma Friendly, MD [facility]  If 7PM-7AM, please contact night-coverage [email or web address] 02/20/2021, 11:19 AM

## 2021-02-21 DIAGNOSIS — R519 Headache, unspecified: Secondary | ICD-10-CM | POA: Diagnosis not present

## 2021-02-21 DIAGNOSIS — R112 Nausea with vomiting, unspecified: Secondary | ICD-10-CM | POA: Diagnosis not present

## 2021-02-21 DIAGNOSIS — K529 Noninfective gastroenteritis and colitis, unspecified: Secondary | ICD-10-CM | POA: Diagnosis not present

## 2021-02-21 DIAGNOSIS — I255 Ischemic cardiomyopathy: Secondary | ICD-10-CM | POA: Diagnosis not present

## 2021-02-21 DIAGNOSIS — I5042 Chronic combined systolic (congestive) and diastolic (congestive) heart failure: Secondary | ICD-10-CM | POA: Diagnosis not present

## 2021-02-21 LAB — CBC WITH DIFFERENTIAL/PLATELET
Abs Immature Granulocytes: 0.02 10*3/uL (ref 0.00–0.07)
Basophils Absolute: 0.1 10*3/uL (ref 0.0–0.1)
Basophils Relative: 1 %
Eosinophils Absolute: 0.2 10*3/uL (ref 0.0–0.5)
Eosinophils Relative: 2 %
HCT: 39.8 % (ref 36.0–46.0)
Hemoglobin: 12.9 g/dL (ref 12.0–15.0)
Immature Granulocytes: 0 %
Lymphocytes Relative: 35 %
Lymphs Abs: 2.5 10*3/uL (ref 0.7–4.0)
MCH: 28.2 pg (ref 26.0–34.0)
MCHC: 32.4 g/dL (ref 30.0–36.0)
MCV: 86.9 fL (ref 80.0–100.0)
Monocytes Absolute: 0.6 10*3/uL (ref 0.1–1.0)
Monocytes Relative: 9 %
Neutro Abs: 3.7 10*3/uL (ref 1.7–7.7)
Neutrophils Relative %: 53 %
Platelets: 302 10*3/uL (ref 150–400)
RBC: 4.58 MIL/uL (ref 3.87–5.11)
RDW: 14.6 % (ref 11.5–15.5)
WBC: 7.1 10*3/uL (ref 4.0–10.5)
nRBC: 0 % (ref 0.0–0.2)

## 2021-02-21 LAB — BASIC METABOLIC PANEL
Anion gap: 7 (ref 5–15)
BUN: 14 mg/dL (ref 6–20)
CO2: 29 mmol/L (ref 22–32)
Calcium: 9.7 mg/dL (ref 8.9–10.3)
Chloride: 102 mmol/L (ref 98–111)
Creatinine, Ser: 1.24 mg/dL — ABNORMAL HIGH (ref 0.44–1.00)
GFR, Estimated: 50 mL/min — ABNORMAL LOW (ref >60)
Glucose, Bld: 102 mg/dL — ABNORMAL HIGH (ref 70–99)
Potassium: 3.7 mmol/L (ref 3.5–5.1)
Sodium: 138 mmol/L (ref 135–145)

## 2021-02-21 NOTE — Progress Notes (Signed)
PROGRESS NOTE  [name]:[id number] DOB: 05-09-1961 DOA: 02/18/2021 PCP: [name], MD  HPI/Recap of past 24 hours: Erin [name]  is a 60 y.o. female, with PMH significant for ischemic cardiomyopathy s/p ICD, (Echo 03/2020 EF 30-35% with G2DD), CAD (S/P DES to proximal Lcx and PDA, last cath 08/2019 revealing patent stents and minimal disease), posterior communicating artery brain aneurysm (S/P embolization 04/2019), GERD, nicotine dependence, presents to ED 2/2 complaints of nausea, vomiting and diarrhea, as well headache. Pt also reports dizziness, diaphoresis with presyncope. Of note, pt with recent hospitalization at [facility] in late August for similar complaints, where she had endoscopy significant for gastritis, as well her CT abdomen was significant for liver lesions of unknown etiology. Pt reports that since her BP meds were  restarted about 2 weeks ago, she has been feeling weak. In the ED, noted to be hypotensive, SBP in the 70s, she received total of 2 L fluid bolus with improvement. CT abdomen pelvis was significant for liver lesions, with no significant change in size. Labs fairly stable. [facility] consulted to admit.    Today, pt denies any new complaints, intermittently nauseated, but hasnt vomited. Denies any abdominal pain, fever/chills, chest pain or SOB.   Assessment/Plan: Active Problems:   Essential hypertension   Cardiomyopathy, ischemic   Coronary artery disease involving native coronary artery of native heart   Chronic combined systolic and diastolic congestive heart failure (HCC)   Chronic diarrhea   Intractable nausea and vomiting   Intractable nausea and vomiting Improving Pt attributes her overall symptoms to hypotension 2/2 BP meds Am cortisol 5.9 EGD done in 12/2020 showed H.pylori gastritis and completed treatment CT abdomen pelvis was significant for liver lesions, with no significant change in size, CT head done given hx of  aneurysm (negative for any intracranial abnormality) GI consulted, appreciate recs with extensive work up, outpt MRI (has ICD) S/P IVF Supportive management, advance diet as tolerated  Low am cortisol Am cortisol 5.9 ?concern for ??Addisons  GI recommends endocrinology consult- will consult/speak to in am Monitor closely   Hypotension- resolved Weakness/dizziness BP improved s/p IVF Likely 2/2 nausea, vomiting, and antihypertensive regimen S/P IVF Restart her BP meds gradually and monitor BP   Chronic combined systolic and diastolic CHF Currently appears euvolemic CXR done 02/20/21- no pulm edema or consolidation Most recent echo of November 2021 with EF 30% Restarted Coreg, hold Lasix, Aldactone, and gradually restart pending BP   History of CAD / HLD Denies any chest pain Trop X 2 neg Continue with aspirin, Plavix, statin   Tobacco abuse Advised to quit      Estimated body mass index is 22.99 kg/m as calculated from the following:   Height as of this encounter: 5' (1.524 m).   Weight as of this encounter: 53.4 kg.     Code Status: Full  Family Communication: None at bedside  Disposition Plan: Status is: Observation  The patient will require care spanning > 2 midnights and should be moved to inpatient because: Level of care    Consultants: GI  Procedures: None  Antimicrobials: None  DVT prophylaxis: Lovenox   Objective: Vitals:   02/20/21 2017 02/20/21 2023 02/21/21 0517 02/21/21 0953  BP: 110/71 (!) 110/91 111/71 120/72  Pulse: 98 99 82 92  Resp:  16 18   Temp:  98 F (36.7 C) 98 F (36.7 C)   TempSrc:  Oral Oral   SpO2:  100% 98%   Weight:  Height:        Intake/Output Summary (Last 24 hours) at 02/21/2021 1035 Last data filed at 02/21/2021 0900 Gross per 24 hour  Intake 880 ml  Output --  Net 880 ml   Filed Weights   02/18/21 1900  Weight: 53.4 kg    Exam: General: NAD  Cardiovascular: S1, S2 present Respiratory:  CTAB Abdomen: Soft, nontender, nondistended, bowel sounds present Musculoskeletal: No bilateral pedal edema noted Skin: Normal Psychiatry: Normal mood     Data Reviewed: CBC: Recent Labs  Lab 02/18/21 1118 02/19/21 0636 02/20/21 0457 02/21/21 0557  WBC 9.9 7.1 6.6 7.1  NEUTROABS  --   --  3.7 3.7  HGB 13.1 12.1 11.5* 12.9  HCT 39.6 37.2 36.3 39.8  MCV 87.2 90.1 89.2 86.9  PLT 300 314 265 779   Basic Metabolic Panel: Recent Labs  Lab 02/15/21 0825 02/18/21 1118 02/18/21 1217 02/19/21 0636 02/20/21 0457 02/21/21 0557  NA 139 135  --  140 140 138  K 4.2 3.2*  --  4.0 3.9 3.7  CL 101 101  --  112* 111 102  CO2 22 24  --  22 21* 29  GLUCOSE 95 116*  --  78 80 102*  BUN 26* 21*  --  12 11 14   CREATININE 1.32* 1.05*  --  0.93 0.92 1.24*  CALCIUM 9.9 9.0  --  8.6* 9.3 9.7  MG  --   --  2.2  --   --   --    GFR: Estimated Creatinine Clearance: 35.5 mL/min (A) (by C-G formula based on SCr of 1.24 mg/dL (H)). Liver Function Tests: Recent Labs  Lab 02/18/21 1119 02/20/21 0457  AST 22 16  ALT 18 15  ALKPHOS 68 57  BILITOT 1.1 1.1  PROT 6.7 5.8*  ALBUMIN 3.8 3.2*   Recent Labs  Lab 02/18/21 1119  LIPASE 67*   No results for input(s): AMMONIA in the last 168 hours. Coagulation Profile: No results for input(s): INR, PROTIME in the last 168 hours. Cardiac Enzymes: No results for input(s): CKTOTAL, CKMB, CKMBINDEX, TROPONINI in the last 168 hours. BNP (last 3 results) No results for input(s): PROBNP in the last 8760 hours. HbA1C: No results for input(s): HGBA1C in the last 72 hours. CBG: No results for input(s): GLUCAP in the last 168 hours. Lipid Profile: No results for input(s): CHOL, HDL, LDLCALC, TRIG, CHOLHDL, LDLDIRECT in the last 72 hours. Thyroid Function Tests: Recent Labs    02/19/21 1216  TSH 0.424   Anemia Panel: No results for input(s): VITAMINB12, FOLATE, FERRITIN, TIBC, IRON, RETICCTPCT in the last 72 hours. Urine analysis:    Component  Value Date/Time   COLORURINE STRAW (A) 02/18/2021 1743   APPEARANCEUR CLEAR 02/18/2021 1743   LABSPEC 1.022 02/18/2021 1743   PHURINE 6.0 02/18/2021 1743   GLUCOSEU >=500 (A) 02/18/2021 1743   HGBUR NEGATIVE 02/18/2021 [place] 02/18/2021 1743   KETONESUR 5 (A) 02/18/2021 1743   PROTEINUR NEGATIVE 02/18/2021 1743   NITRITE NEGATIVE 02/18/2021 1743   LEUKOCYTESUR NEGATIVE 02/18/2021 1743   Sepsis Labs: @LABRCNTIP (procalcitonin:4,lacticidven:4)  ) Recent Results (from the past 240 hour(s))  Culture, blood (routine x 2)     Status: None (Preliminary result)   Collection Time: 02/18/21 12:16 PM   Specimen: BLOOD RIGHT HAND  Result Value Ref Range Status   Specimen Description BLOOD RIGHT HAND  Final   Special Requests   Final    Blood Culture results may not be optimal  due to an inadequate volume of blood received in culture bottles BOTTLES DRAWN AEROBIC ONLY   Culture   Final    NO GROWTH 3 DAYS Performed at [facility], [address] [place], [place] [address]    Report Status PENDING  Incomplete  Culture, blood (routine x 2)     Status: None (Preliminary result)   Collection Time: 02/18/21 12:16 PM   Specimen: Left Antecubital; Blood  Result Value Ref Range Status   Specimen Description LEFT ANTECUBITAL  Final   Special Requests   Final    Blood Culture results may not be optimal due to an excessive volume of blood received in culture bottles BOTTLES DRAWN AEROBIC AND ANAEROBIC   Culture   Final    NO GROWTH 3 DAYS Performed at [facility], [address], [place], [place] [address]    Report Status PENDING  Incomplete  Resp Panel by RT-PCR (Flu A&B, Covid) Nasopharyngeal Swab     Status: None   Collection Time: 02/18/21  5:32 PM   Specimen: Nasopharyngeal Swab; Nasopharyngeal(NP) swabs in vial transport medium  Result Value Ref Range Status   SARS Coronavirus 2 by RT PCR NEGATIVE NEGATIVE Final    Comment: (NOTE) SARS-CoV-2 target nucleic acids are  NOT DETECTED.  The SARS-CoV-2 RNA is generally detectable in upper respiratory specimens during the acute phase of infection. The lowest concentration of SARS-CoV-2 viral copies this assay can detect is 138 copies/mL. A negative result does not preclude SARS-Cov-2 infection and should not be used as the sole basis for treatment or other patient management decisions. A negative result may occur with  improper specimen collection/handling, submission of specimen other than nasopharyngeal swab, presence of viral mutation(s) within the areas targeted by this assay, and inadequate number of viral copies(<138 copies/mL). A negative result must be combined with clinical observations, patient history, and epidemiological information. The expected result is Negative.  Fact Sheet for Patients:  EntrepreneurPulse.com.au  Fact Sheet for Healthcare Providers:  IncredibleEmployment.be  This test is no t yet approved or cleared by the [place] FDA and  has been authorized for detection and/or diagnosis of SARS-CoV-2 by FDA under an Emergency Use Authorization (EUA). This EUA will remain  in effect (meaning this test can be used) for the duration of the COVID-19 declaration under Section 564(b)(1) of the Act, 21 U.S.C.section 360bbb-3(b)(1), unless the authorization is terminated  or revoked sooner.       Influenza A by PCR NEGATIVE NEGATIVE Final   Influenza B by PCR NEGATIVE NEGATIVE Final    Comment: (NOTE) The Xpert Xpress SARS-CoV-2/FLU/RSV plus assay is intended as an aid in the diagnosis of influenza from Nasopharyngeal swab specimens and should not be used as a sole basis for treatment. Nasal washings and aspirates are unacceptable for Xpert Xpress SARS-CoV-2/FLU/RSV testing.  Fact Sheet for Patients: EntrepreneurPulse.com.au  Fact Sheet for Healthcare Providers: IncredibleEmployment.be  This test is not  yet approved or cleared by the [place] FDA and has been authorized for detection and/or diagnosis of SARS-CoV-2 by FDA under an Emergency Use Authorization (EUA). This EUA will remain in effect (meaning this test can be used) for the duration of the COVID-19 declaration under Section 564(b)(1) of the Act, 21 U.S.C. section 360bbb-3(b)(1), unless the authorization is terminated or revoked.  Performed at [facility], [address], [place], [place] [address]       Studies: No results found.  Scheduled Meds:  ALPRAZolam  1 mg Oral QHS   aspirin  EC  81 mg Oral QPM   atorvastatin  80 mg Oral q1800   carvedilol  3.125 mg Oral BID   clopidogrel  75 mg Oral Daily   dapagliflozin propanediol  10 mg Oral QAC breakfast   enoxaparin (LOVENOX) injection  40 mg Subcutaneous Q24H   ezetimibe  10 mg Oral Daily   gabapentin  200 mg Oral QHS   nicotine  21 mg Transdermal Daily   pantoprazole  40 mg Oral Daily   rOPINIRole  0.5 mg Oral QHS    Continuous Infusions:   LOS: 0 days     Alma Friendly, MD [facility]  If 7PM-7AM, please contact night-coverage [email or web address] 02/21/2021, 10:35 AM

## 2021-02-21 NOTE — Progress Notes (Signed)
Patients admits that her headache as subsided since receiving medications. She admits 0/10 pain currently.

## 2021-02-21 NOTE — Progress Notes (Signed)
Erin [name], M.D. Gastroenterology & Hepatology   Interval History:  No acute events overnight. Tolerating diet well, denies any nausea or vomigint. No abdominalpain. No new complaints.  Inpatient Medications:  Current Facility-Administered Medications:    acetaminophen (TYLENOL) tablet 650 mg, 650 mg, Oral, Q6H PRN, Alma Friendly, MD, 650 mg at 02/20/21 1807   ALPRAZolam (XANAX) tablet 1 mg, 1 mg, Oral, QHS, 1 mg at 02/20/21 2019 **AND** ALPRAZolam (XANAX) tablet 1 mg, 1 mg, Oral, Daily PRN, Elgergawy, Silver Huguenin, MD   aspirin EC tablet 81 mg, 81 mg, Oral, QPM, Elgergawy, Silver Huguenin, MD, 81 mg at 02/20/21 1807   atorvastatin (LIPITOR) tablet 80 mg, 80 mg, Oral, q1800, Elgergawy, Silver Huguenin, MD, 80 mg at 02/20/21 1807   carvedilol (COREG) tablet 3.125 mg, 3.125 mg, Oral, BID, Alma Friendly, MD, 3.125 mg at 02/20/21 2017   clopidogrel (PLAVIX) tablet 75 mg, 75 mg, Oral, Daily, Elgergawy, Silver Huguenin, MD, 75 mg at 02/20/21 1157   dapagliflozin propanediol (FARXIGA) tablet 10 mg, 10 mg, Oral, QAC breakfast, Elgergawy, Silver Huguenin, MD, 10 mg at 02/20/21 0846   enoxaparin (LOVENOX) injection 40 mg, 40 mg, Subcutaneous, Q24H, Elgergawy, Silver Huguenin, MD, 40 mg at 02/19/21 1738   ezetimibe (ZETIA) tablet 10 mg, 10 mg, Oral, Daily, Alma Friendly, MD, 10 mg at 02/20/21 2620   gabapentin (NEURONTIN) capsule 200 mg, 200 mg, Oral, QHS, Alma Friendly, MD, 200 mg at 02/20/21 2018   loperamide (IMODIUM) capsule 2 mg, 2 mg, Oral, QID PRN, Elgergawy, Silver Huguenin, MD   nicotine (NICODERM CQ - dosed in mg/24 hours) patch 21 mg, 21 mg, Transdermal, Daily, Elgergawy, Silver Huguenin, MD, 21 mg at 02/20/21 0847   nitroGLYCERIN (NITROSTAT) SL tablet 0.4 mg, 0.4 mg, Sublingual, Q5 Min x 3 PRN, Alma Friendly, MD   pantoprazole (PROTONIX) EC tablet 40 mg, 40 mg, Oral, Daily, Elgergawy, Silver Huguenin, MD, 40 mg at 02/20/21 0838   rOPINIRole (REQUIP) tablet 0.5 mg, 0.5 mg, Oral, QHS, Elgergawy, Silver Huguenin, MD,  0.5 mg at 02/20/21 2017   I/O    Intake/Output Summary (Last 24 hours) at 02/21/2021 0842 Last data filed at 02/20/2021 1246 Gross per 24 hour  Intake 200 ml  Output --  Net 200 ml     Physical Exam: Temp:  [97.9 F (36.6 C)-98 F (36.7 C)] 98 F (36.7 C) (10/16 0517) Pulse Rate:  [82-99] 82 (10/16 0517) Resp:  [16-18] 18 (10/16 0517) BP: (110-113)/(66-91) 111/71 (10/16 0517) SpO2:  [98 %-100 %] 98 % (10/16 0517) FiO2 (%):  [21 %] 21 % (10/15 1325)  Temp (24hrs), Avg:98 F (36.7 C), Min:97.9 F (36.6 C), Max:98 F (36.7 C)  GENERAL: The patient is AO x3, in no acute distress. HEENT: Head is normocephalic and atraumatic. EOMI are intact. Mouth is well hydrated and without lesions. NECK: Supple. No masses LUNGS: Clear to auscultation. No presence of rhonchi/wheezing/rales. Adequate chest expansion HEART: RRR, normal s1 and s2. ABDOMEN: Soft, nontender, no guarding, no peritoneal signs, and nondistended. BS +. No masses. EXTREMITIES: Without any cyanosis, clubbing, rash, lesions or edema. NEUROLOGIC: AOx3, no focal motor deficit. SKIN: no jaundice, no rashes  Laboratory Data: CBC:     Component Value Date/Time   WBC 7.1 02/21/2021 0557   RBC 4.58 02/21/2021 0557   HGB 12.9 02/21/2021 0557   HCT 39.8 02/21/2021 0557   PLT 302 02/21/2021 0557   MCV 86.9 02/21/2021 0557   MCH 28.2 02/21/2021 0557   MCHC 32.4  02/21/2021 0557   RDW 14.6 02/21/2021 0557   LYMPHSABS 2.5 02/21/2021 0557   MONOABS 0.6 02/21/2021 0557   EOSABS 0.2 02/21/2021 0557   BASOSABS 0.1 02/21/2021 0557   COAG:  Lab Results  Component Value Date   INR 0.9 10/19/2020   INR 0.9 06/04/2020   INR 0.9 04/15/2019    BMP:  BMP Latest Ref Rng & Units 02/21/2021 02/20/2021 02/19/2021  Glucose 70 - 99 mg/dL 102(H) 80 78  BUN 6 - 20 mg/dL 14 11 12   Creatinine 0.44 - 1.00 mg/dL 1.24(H) 0.92 0.93  BUN/Creat Ratio 9 - 23 - - -  Sodium 135 - 145 mmol/L 138 140 140  Potassium 3.5 - 5.1 mmol/L 3.7 3.9  4.0  Chloride 98 - 111 mmol/L 102 111 112(H)  CO2 22 - 32 mmol/L 29 21(L) 22  Calcium 8.9 - 10.3 mg/dL 9.7 9.3 8.6(L)    HEPATIC:  Hepatic Function Latest Ref Rng & Units 02/20/2021 02/18/2021 01/25/2021  Total Protein 6.5 - 8.1 g/dL 5.8(L) 6.7 7.0  Albumin 3.5 - 5.0 g/dL 3.2(L) 3.8 3.7  AST 15 - 41 U/L 16 22 18   ALT 0 - 44 U/L 15 18 12   Alk Phosphatase 38 - 126 U/L 57 68 69  Total Bilirubin 0.3 - 1.2 mg/dL 1.1 1.1 0.7  Bilirubin, Direct 0.0 - 0.2 mg/dL - 0.1 -    CARDIAC:  Lab Results  Component Value Date   TROPONINI <0.03 10/23/2018      Imaging: I personally reviewed and interpreted the available labs, imaging and endoscopic files.   Assessment/Plan: 60 year old female with multiple comorbidities including C. difficile in 2019, ischemic cardiomyopathy, coronary artery disease status post stent placement on dual antiplatelet therapy, ICD placement, left posterior [place] artery aneurysm status post embolization, who came to the hospital after presenting worsening nausea and vomiting.  The patient had a similar presentation back in May 2022 for which she was admitted to [facility] and underwent an EGD that did not show any alterations besides H. pylori gastritis that was successfully treated.  However, this time she presented significant hypotension upon admission.   The patient has tolerated food intake for the last 48 hours and has not presented any more vomiting episodes.  She had presence of some liver lesions in the past which raised the concern for possible carcinoid as she presented some episodes of flushing and shortness of breath but this is not clear yet.  24-hour urine sample to check 5 HIAA levels and TTG IgA are in process.  Notably, her serum morning cortisol came back low which raises the concern for possible adrenal insufficiency as a cause of her symptoms, for which she may need to have an evaluation with cosyntropin stimulation test - will need outpatient endocrinology  follow up for this.TSH was within normal limits.   For now, will recommend advancing diet as tolerated continue with supportive treatment with Zofran as needed.   - F/u 5-HIAA in urine and TTG IgA titer - Consider cosyntropin stimulation test -bwill need otupatient follow up with endocrinology - Zofran as needed for nausea - Advance diet as tolerated - Patient will follow up in GI clinic with Dr. Gala Romney in 3-4 weeks. - GI service will sign-off, please call us back if you have any more questions.  Erin Peppers, MD Gastroenterology and Hepatology [facility]

## 2021-02-21 NOTE — Progress Notes (Signed)
Patient c/o headache that has been unresolved with tylenol administration . Covering MD(Dr. Earnest Conroy) texted and informed. Order given for medications for headache.

## 2021-02-22 ENCOUNTER — Telehealth: Payer: Self-pay

## 2021-02-22 ENCOUNTER — Ambulatory Visit (INDEPENDENT_AMBULATORY_CARE_PROVIDER_SITE_OTHER): Payer: 59

## 2021-02-22 DIAGNOSIS — I10 Essential (primary) hypertension: Secondary | ICD-10-CM | POA: Diagnosis not present

## 2021-02-22 DIAGNOSIS — R519 Headache, unspecified: Secondary | ICD-10-CM | POA: Diagnosis not present

## 2021-02-22 DIAGNOSIS — I5042 Chronic combined systolic (congestive) and diastolic (congestive) heart failure: Secondary | ICD-10-CM | POA: Diagnosis not present

## 2021-02-22 DIAGNOSIS — I255 Ischemic cardiomyopathy: Secondary | ICD-10-CM | POA: Diagnosis not present

## 2021-02-22 DIAGNOSIS — R112 Nausea with vomiting, unspecified: Secondary | ICD-10-CM | POA: Diagnosis not present

## 2021-02-22 DIAGNOSIS — Z95810 Presence of automatic (implantable) cardiac defibrillator: Secondary | ICD-10-CM

## 2021-02-22 DIAGNOSIS — I25119 Atherosclerotic heart disease of native coronary artery with unspecified angina pectoris: Secondary | ICD-10-CM | POA: Diagnosis not present

## 2021-02-22 LAB — BASIC METABOLIC PANEL
Anion gap: 6 (ref 5–15)
BUN: 13 mg/dL (ref 6–20)
CO2: 29 mmol/L (ref 22–32)
Calcium: 9.7 mg/dL (ref 8.9–10.3)
Chloride: 106 mmol/L (ref 98–111)
Creatinine, Ser: 0.99 mg/dL (ref 0.44–1.00)
GFR, Estimated: >60 mL/min (ref >60)
Glucose, Bld: 97 mg/dL (ref 70–99)
Potassium: 4.1 mmol/L (ref 3.5–5.1)
Sodium: 141 mmol/L (ref 135–145)

## 2021-02-22 MED ORDER — FUROSEMIDE 20 MG PO TABS
20.0000 mg | ORAL_TABLET | Freq: Every day | ORAL | Status: DC
  Administered 2021-02-22: 20 mg via ORAL
  Filled 2021-02-22: qty 1

## 2021-02-22 MED ORDER — SPIRONOLACTONE 12.5 MG HALF TABLET
12.5000 mg | ORAL_TABLET | Freq: Every day | ORAL | Status: DC
  Administered 2021-02-22: 12.5 mg via ORAL
  Filled 2021-02-22 (×3): qty 1

## 2021-02-22 NOTE — Progress Notes (Signed)
Discharge instructions given. Pt verbalized understanding. Awaiting work note and ride to come pick patient up.

## 2021-02-22 NOTE — Discharge Summary (Signed)
Discharge Summary  Erin [name] MAU:[id number] DOB: Oct 29, 1960  PCP: [name], MD  Admit date: 02/18/2021 Discharge date: 02/22/2021  Time spent: 40 mins  Recommendations for Outpatient Follow-up:  Follow-up with PCP in 1 week Follow-up with GI as scheduled in 3 to 4 weeks Follow-up with cardiology as scheduled Follow-up with endocrinology as scheduled- Dr Dorris Fetch will call for an appointment   Discharge Diagnoses:  Active Hospital Problems   Diagnosis Date Noted   Intractable nausea and vomiting 01/03/2021   Chronic diarrhea 12/20/2017   Chronic combined systolic and diastolic congestive heart failure ([place]) 06/20/2016   Cardiomyopathy, ischemic 03/25/2016   Coronary artery disease involving native coronary artery of native heart 03/25/2016   Essential hypertension 09/06/2015    Resolved Hospital Problems  No resolved problems to display.    Discharge Condition: Stable  Diet recommendation: Heart healthy   Vitals:   02/22/21 0504 02/22/21 0917  BP: 116/90 121/80  Pulse: 77 89  Resp: 18   Temp: 98.6 F (37 C)   SpO2: 99%     History of present illness:  Erin [name]  is a 60 y.o. female, with PMH significant for ischemic cardiomyopathy s/p ICD, (Echo 03/2020 EF 30-35% with G2DD), CAD (S/P DES to proximal Lcx and PDA, last cath 08/2019 revealing patent stents and minimal disease), posterior communicating artery brain aneurysm (S/P embolization 04/2019), GERD, nicotine dependence, presents to ED 2/2 complaints of nausea, vomiting and diarrhea, as well headache. Pt also reports dizziness, diaphoresis with presyncope. Of note, pt with recent hospitalization at [facility] in late August for similar complaints, where she had endoscopy significant for gastritis, as well her CT abdomen was significant for liver lesions of unknown etiology. Pt reports that since her BP meds were  restarted about 2 weeks ago, she has been feeling weak. In the ED, noted to be  hypotensive, SBP in the 70s, she received total of 2 L fluid bolus with improvement. CT abdomen pelvis was significant for liver lesions, with no significant change in size. Labs fairly stable. [facility] hospitalist consulted to admit.    Today, patient denies any new complaints, able to keep down oral food, denies any further nausea/vomiting, abdominal pain, chest pain, fever/chills.  Patient to follow-up with her PCP in 1 week, cardiology/GI as scheduled, endocrinologist will call patient for follow-up outpatient appointment.    Hospital Course:  Active Problems:   Essential hypertension   Cardiomyopathy, ischemic   Coronary artery disease involving native coronary artery of native heart   Chronic combined systolic and diastolic congestive heart failure (H[facility])   Chronic diarrhea   Intractable nausea and vomiting    Intractable nausea and vomiting Resolved Pt attributes her overall symptoms to hypotension 2/2 BP meds Am cortisol 5.9 EGD done in 12/2020 showed H.pylori gastritis and completed treatment CT abdomen pelvis was significant for liver lesions, with no significant change in size, CT head done given hx of aneurysm (negative for any intracranial abnormality) GI consulted, appreciate recs with extensive work up, outpt MRI (has ICD) S/P IVF Follow up with PCP   Low am cortisol Am cortisol 5.9 ?concern for ??unlikely Adrenal insufficiency  GI recommends endocrinology consult- Discussed with Dr Dorris Fetch endocrinologist, unlikely but would follow up as an outpatient Discussed with patient   Hypotension- resolved Weakness/dizziness- improved BP improved s/p IVF Likely 2/2 nausea, vomiting, and antihypertensive regimen S/P IVF Restart her coreg, lasix, aldactone Advised to check BP at least daily   Chronic combined systolic and diastolic CHF Currently  appears euvolemic CXR done 02/20/21- no pulm edema or consolidation Most recent echo of November 2021 with EF 30% Restarted  Coreg, Lasix, Aldactone   History of CAD / HLD Denies any chest pain Trop X 2 neg Continue with aspirin, Plavix, statin    Tobacco abuse Advised to quit      Estimated body mass index is 22.99 kg/m as calculated from the following:   Height as of this encounter: 5' (1.524 m).   Weight as of this encounter: 53.4 kg.    Procedures: None  Consultations: GI  Discharge Exam: BP 121/80   Pulse 89   Temp 98.6 F (37 C) (Oral)   Resp 18   Ht 5' (1.524 m)   Wt 53.4 kg   SpO2 99%   BMI 22.99 kg/m    General: NAD  Cardiovascular: S1, S2 present Respiratory: CTAB Abdomen: Soft, nontender, nondistended, bowel sounds present Musculoskeletal: No bilateral pedal edema noted Skin: Normal Psychiatry: Normal mood    Discharge Instructions You were cared for by a hospitalist during your hospital stay. If you have any questions about your discharge medications or the care you received while you were in the hospital after you are discharged, you can call the unit and asked to speak with the hospitalist on call if the hospitalist that took care of you is not available. Once you are discharged, your primary care physician will handle any further medical issues. Please note that NO REFILLS for any discharge medications will be authorized once you are discharged, as it is imperative that you return to your primary care physician (or establish a relationship with a primary care physician if you do not have one) for your aftercare needs so that they can reassess your need for medications and monitor your lab values.  Discharge Instructions     Ambulatory referral to Endocrinology   Complete by: As directed       Allergies as of 02/22/2021       Reactions   Tape Other (See Comments)   PEELS SKIN OFF  (PAPER TAPE IS FINE)        Medication List     TAKE these medications    ALPRAZolam 1 MG tablet Commonly known as: XANAX Take 1 mg by mouth See admin instructions. Take 1 mg  by mouth at bedtime and an additional 1 mg daily as needed for anxiety   aspirin EC 81 MG tablet Take 81 mg by mouth every evening.   atorvastatin 80 MG tablet Commonly known as: LIPITOR Take 1 tablet (80 mg total) by mouth daily at 6 PM.   carvedilol 3.125 MG tablet Commonly known as: COREG Take 1 tablet (3.125 mg total) by mouth 2 (two) times daily.   clopidogrel 75 MG tablet Commonly known as: PLAVIX Take 1 tablet (75 mg total) by mouth daily.   dapagliflozin propanediol 10 MG Tabs tablet Commonly known as: Farxiga Take 1 tablet (10 mg total) by mouth daily before breakfast.   ezetimibe 10 MG tablet Commonly known as: ZETIA Take 1 tablet (10 mg total) by mouth daily.   furosemide 20 MG tablet Commonly known as: LASIX Take 1 tablet (20 mg total) by mouth daily.   gabapentin 100 MG capsule Commonly known as: NEURONTIN Take 200 mg by mouth at bedtime.   loperamide 2 MG capsule Commonly known as: IMODIUM Take 2 mg by mouth 4 (four) times daily as needed for diarrhea or loose stools (ibs).   multivitamin with minerals tablet Take 1  tablet by mouth daily.   nicotine 21 mg/24hr patch Commonly known as: NICODERM CQ - dosed in mg/24 hours Place 21 mg onto the skin daily.   nitroGLYCERIN 0.4 MG SL tablet Commonly known as: NITROSTAT Place 1 tablet (0.4 mg total) under the tongue every 5 (five) minutes x 3 doses as needed for chest pain (if no relief after 2nd dose, proceed to the ED for an evaluation or call 911).   omeprazole 20 MG capsule Commonly known as: PRILOSEC Take 1 capsule (20 mg total) by mouth daily.   rOPINIRole 0.5 MG tablet Commonly known as: REQUIP Take 0.5 mg by mouth at bedtime.   spironolactone 25 MG tablet Commonly known as: ALDACTONE Take 1 tablet (25 mg total) by mouth at bedtime.   VITAMIN D-3 PO Take 1 capsule by mouth daily with breakfast.   zinc gluconate 50 MG tablet Take 50 mg by mouth daily.       Allergies  Allergen  Reactions   Tape Other (See Comments)    PEELS SKIN OFF  (PAPER TAPE IS FINE)    Follow-up Information     [name], MD. Schedule an appointment as soon as possible for a visit in 1 week(s).   Specialty: Internal Medicine Contact information: [place] [address] [phone]         Larey Dresser, MD .   Specialty: Cardiology Contact information: [phone] [address] [place] [address] [phone]         Constance Haw, MD .   Specialty: Cardiology Contact information: [address] [place] [address] [phone]         Daneil Dolin, MD Follow up.   Specialty: Gastroenterology Why: They will call you in 3-4 weeks for an appointment Contact information: [address] [place] [address] [phone]         Cassandria Anger, MD Follow up.   Specialty: Endocrinology Why: Office will call for an appointment Contact information: [place] [address] [phone]                  The results of significant diagnostics from this hospitalization (including imaging, microbiology, ancillary and laboratory) are listed below for reference.    Significant Diagnostic Studies: CT Angio Head W or Wo Contrast  Result Date: 02/18/2021 CLINICAL DATA:  Neuro deficit, stroke suspected EXAM: CT ANGIOGRAPHY HEAD TECHNIQUE: Multidetector CT imaging of the head was performed using the standard protocol during bolus administration of intravenous contrast. Multiplanar CT image reconstructions and MIPs were obtained to evaluate the vascular anatomy. CONTRAST:  176mL OMNIPAQUE IOHEXOL 350 MG/ML SOLN COMPARISON:  12/10/2020 FINDINGS: CT HEAD Brain: Stable left posterior parietal remote infarct. No acute infarct, hemorrhage, hydrocephalus, extra-axial collection, mass, mass effect, or midline shift. Vascular: No hyperdense vessel.  See CTA findings. Skull: Normal.  Negative for fracture or focal lesion. Sinuses: No acute or significant finding. Other: None. CTA HEAD Anterior circulation: Redemonstrated distal left ICA stent, which appears patent. No residual aneurysm seen. Both internal carotid arteries are patent to the termini, with atherosclerotic calcifications but without stenosis or other abnormality. A1 segments patent. Normal anterior communicating artery. Anterior cerebral arteries are patent to their distal aspects. No M1 stenosis or occlusion. Normal MCA bifurcations. Distal MCA branches perfused and symmetric. Posterior circulation: Vertebral arteries widely patent to the vertebrobasilar junction without stenosis. Posterior inferior cerebral arteries patent bilaterally. Basilar patent to its distal  aspect. Superior cerebral arteries patent bilaterally. PCAs perfused to their distal aspects without stenosis. Unchanged appearance of the right posterior communicating artery, with infundibulum. The left posterior communicating artery is not visualized. Venous sinuses: As permitted by contrast timing, patent. Anatomic variants: None significant Review of the MIP images confirms the above findings. IMPRESSION: 1. No acute intracranial process. 2. Distal left ICA stent is patent, without residual aneurysm. 3. Stable infundibulum at the origin of the right posterior communicating artery. 4. No large vessel occlusion. Electronically Signed   By: Merilyn Baba M.D.   On: 02/18/2021 15:03   DG Chest 2 View  Result Date: 02/18/2021 CLINICAL DATA:  Chest pain EXAM: CHEST - 2 VIEW COMPARISON:  None. FINDINGS: Cardiomegaly. Left chest single lead pacer defibrillator. Both lungs are clear. The visualized skeletal structures are unremarkable. IMPRESSION: Cardiomegaly without acute abnormality of the lungs. Electronically Signed   By: Delanna Ahmadi M.D.   On: 02/18/2021 11:12   CT ABDOMEN PELVIS W CONTRAST  Result Date: 02/18/2021 CLINICAL DATA:  Abdominal pain,  hypotension, headache EXAM: CT ABDOMEN AND PELVIS WITH CONTRAST TECHNIQUE: Multidetector CT imaging of the abdomen and pelvis was performed using the standard protocol following bolus administration of intravenous contrast. CONTRAST:  135mL OMNIPAQUE IOHEXOL 350 MG/ML SOLN COMPARISON:  CT abdomen/pelvis 01/25/2021 FINDINGS: Lower chest: The lung bases are clear. Cardiac device leads are partially imaged. A trace pericardial effusion is unchanged. Hepatobiliary: Scattered subcentimeter hypodense lesions are again seen in the liver, the largest in the liver dome measuring 8 mm, unchanged. There are no new focal lesions. The gallbladder is surgically absent. Prominence of the extrahepatic bile ducts is unchanged there is no intrahepatic biliary ductal dilatation. Pancreas: Unremarkable. Spleen: Unremarkable. Adrenals/Urinary Tract: A subcentimeter right adrenal nodule is unchanged since 2018. 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 There are no focal lesions or stones. There is no hydronephrosis or hydroureter. The bladder is unremarkable. Stomach/Bowel: The stomach is unremarkable. There is no evidence of bowel obstruction. There is no abnormal bowel wall thickening or inflammatory change. Vascular/Lymphatic: There is extensive soft and calcified atherosclerotic plaque throughout the abdominal aorta with thick mural thrombus in the infrarenal abdominal aorta. There is bulging of the aorta measuring up to 2.6 cm in maximum diameter without evidence of frank aneurysmal dilation. The appearance is unchanged. The major branch vessels are patent with mild stenosis at the origin of the celiac artery. The main portal and splenic veins are patent. There is no abdominal or pelvic lymphadenopathy. Reproductive: The uterus is surgically absent. There is no  adnexal mass. Other: There is no ascites or free air. Musculoskeletal: There is no acute osseous abnormality. IMPRESSION: 1. No acute findings in the abdomen or pelvis. Overall no significant interval change since 02/04/2021. 2. Unchanged small hypodense lesion in the liver dome, incompletely characterized. As before, nonemergent outpatient MRI can be considered for characterization. 3. Unchanged trace pericardial effusion. Aortic Atherosclerosis (ICD10-I70.0). Electronically Signed   By: Valetta Mole M.D.   On: 02/18/2021 15:16   CT Abdomen Pelvis W Contrast  Result Date: 01/25/2021 CLINICAL DATA:  Acute abdominal pain, lawn localized. Lower abdominal and bilateral flank pain. Nausea. EXAM: CT ABDOMEN AND PELVIS WITH CONTRAST TECHNIQUE: Multidetector CT imaging of the abdomen and pelvis was performed using the standard protocol following bolus administration of intravenous contrast. CONTRAST:  30mL OMNIPAQUE IOHEXOL 350 MG/ML  SOLN COMPARISON:  01/04/2021 FINDINGS: Lower chest: Lung bases are clear. Trace pericardial effusion. Mild cardiac enlargement. Hepatobiliary: 8 mm low-attenuation lesion in the dome of the liver, not definitely present previously. This is too small for accurate characterization. Consider follow-up with elective MRI to exclude developing solid nodule. Gallbladder is surgically absent. No bile duct dilatation. Pancreas: Unremarkable. No pancreatic ductal dilatation or surrounding inflammatory changes. Spleen: Normal in size without focal abnormality. Adrenals/Urinary Tract: Left adrenal gland nodule measuring 2.6 cm diameter. No change since prior study. Subcentimeter right adrenal gland nodule is also unchanged. Bilateral renal parenchymal scarring. Nephrograms are otherwise homogeneous. Aberrant rotation of the kidneys. No hydronephrosis or hydroureter. Bladder is unremarkable. Stomach/Bowel: Stomach, Small bowel, and colon are not abnormally distended. No wall thickening or inflammatory  changes are apparent. Scattered stool throughout the colon. Appendix is not identified. Vascular/Lymphatic: Calcification of the aorta with focal mural thrombus. No significant aneurysm. No significant lymphadenopathy. Reproductive: Uterus is surgically absent. No abnormal pelvic masses. Other: No free air or free fluid in the abdomen. Abdominal wall musculature appears intact. Musculoskeletal: No acute or significant osseous findings. IMPRESSION: 1. 8 mm low-attenuation in the dome of the liver, not definitely present previously. Two small fracture characterization. When the patient is clinically stable and able to follow directions and hold their breath (preferably as an outpatient) further evaluation with dedicated abdominal MRI should be considered. 2. No evidence of bowel obstruction or inflammation. 3. Aortic atherosclerosis. Electronically Signed   By: Lucienne Capers M.D.   On: 01/25/2021 19:16   DG Chest Port 1 View  Result Date: 02/20/2021 CLINICAL DATA:  Shortness of breath EXAM: PORTABLE CHEST 1 VIEW COMPARISON:  02/18/2021 FINDINGS: Stable lung aeration with no new consolidation or edema. No pleural effusion or pneumothorax. Stable cardiomediastinal contours. Left chest wall ICD. IMPRESSION: No acute process in the chest. Electronically Signed   By: Macy Mis M.D.   On: 02/20/2021 09:46    Microbiology: Recent Results (from the past 240 hour(s))  Culture, blood (routine x 2)     Status: None (Preliminary result)   Collection Time: 02/18/21 12:16 PM   Specimen: BLOOD RIGHT HAND  Result Value Ref Range Status   Specimen Description BLOOD RIGHT HAND  Final   Special Requests   Final    Blood Culture results may not be optimal due to an inadequate volume of blood received in culture bottles BOTTLES DRAWN AEROBIC ONLY   Culture   Final    NO GROWTH 4 DAYS Performed at [facility], [address] [place], [place] [address]    Report Status PENDING  Incomplete  Culture, blood  (routine x 2)     Status: None (Preliminary result)   Collection Time: 02/18/21 12:16 PM   Specimen: Left Antecubital; Blood  Result Value Ref Range Status   Specimen Description LEFT ANTECUBITAL  Final   Special Requests   Final    Blood Culture results may not be optimal due to an excessive volume of blood received in culture bottles BOTTLES DRAWN AEROBIC AND ANAEROBIC   Culture   Final    NO GROWTH 4 DAYS Performed at [facility], [address] [place], [place] [address]    Report Status PENDING  Incomplete  Resp Panel by RT-PCR (Flu A&B, Covid) Nasopharyngeal Swab     Status: None   Collection Time: 02/18/21  5:32 PM   Specimen: Nasopharyngeal Swab; Nasopharyngeal(NP) swabs in vial transport medium  Result Value Ref Range Status   SARS Coronavirus  2 by RT PCR NEGATIVE NEGATIVE Final    Comment: (NOTE) SARS-CoV-2 target nucleic acids are NOT DETECTED.  The SARS-CoV-2 RNA is generally detectable in upper respiratory specimens during the acute phase of infection. The lowest concentration of SARS-CoV-2 viral copies this assay can detect is 138 copies/mL. A negative result does not preclude SARS-Cov-2 infection and should not be used as the sole basis for treatment or other patient management decisions. A negative result may occur with  improper specimen collection/handling, submission of specimen other than nasopharyngeal swab, presence of viral mutation(s) within the areas targeted by this assay, and inadequate number of viral copies(<138 copies/mL). A negative result must be combined with clinical observations, patient history, and epidemiological information. The expected result is Negative.  Fact Sheet for Patients:  EntrepreneurPulse.com.au  Fact Sheet for Healthcare Providers:  IncredibleEmployment.be  This test is no t yet approved or cleared by the [place] FDA and  has been authorized for detection and/or diagnosis of  SARS-CoV-2 by FDA under an Emergency Use Authorization (EUA). This EUA will remain  in effect (meaning this test can be used) for the duration of the COVID-19 declaration under Section 564(b)(1) of the Act, 21 U.S.C.section 360bbb-3(b)(1), unless the authorization is terminated  or revoked sooner.       Influenza A by PCR NEGATIVE NEGATIVE Final   Influenza B by PCR NEGATIVE NEGATIVE Final    Comment: (NOTE) The Xpert Xpress SARS-CoV-2/FLU/RSV plus assay is intended as an aid in the diagnosis of influenza from Nasopharyngeal swab specimens and should not be used as a sole basis for treatment. Nasal washings and aspirates are unacceptable for Xpert Xpress SARS-CoV-2/FLU/RSV testing.  Fact Sheet for Patients: EntrepreneurPulse.com.au  Fact Sheet for Healthcare Providers: IncredibleEmployment.be  This test is not yet approved or cleared by the [place] FDA and has been authorized for detection and/or diagnosis of SARS-CoV-2 by FDA under an Emergency Use Authorization (EUA). This EUA will remain in effect (meaning this test can be used) for the duration of the COVID-19 declaration under Section 564(b)(1) of the Act, 21 U.S.C. section 360bbb-3(b)(1), unless the authorization is terminated or revoked.  Performed at [facility], [address], [place], [place] [address]      Labs: Basic Metabolic Panel: Recent Labs  Lab 02/18/21 1118 02/18/21 1217 02/19/21 0636 02/20/21 0457 02/21/21 0557 02/22/21 0548  NA 135  --  140 140 138 141  K 3.2*  --  4.0 3.9 3.7 4.1  CL 101  --  112* 111 102 106  CO2 24  --  22 21* 29 29  GLUCOSE 116*  --  78 80 102* 97  BUN 21*  --  12 11 14 13   CREATININE 1.05*  --  0.93 0.92 1.24* 0.99  CALCIUM 9.0  --  8.6* 9.3 9.7 9.7  MG  --  2.2  --   --   --   --    Liver Function Tests: Recent Labs  Lab 02/18/21 1119 02/20/21 0457  AST 22 16  ALT 18 15  ALKPHOS 68 57  BILITOT 1.1 1.1  PROT 6.7 5.8*   ALBUMIN 3.8 3.2*   Recent Labs  Lab 02/18/21 1119  LIPASE 67*   No results for input(s): AMMONIA in the last 168 hours. CBC: Recent Labs  Lab 02/18/21 1118 02/19/21 0636 02/20/21 0457 02/21/21 0557  WBC 9.9 7.1 6.6 7.1  NEUTROABS  --   --  3.7 3.7  HGB 13.1 12.1 11.5* 12.9  HCT 39.6  37.2 36.3 39.8  MCV 87.2 90.1 89.2 86.9  PLT 300 314 265 302   Cardiac Enzymes: No results for input(s): CKTOTAL, CKMB, CKMBINDEX, TROPONINI in the last 168 hours. BNP: BNP (last 3 results) Recent Labs    12/10/20 1009 01/03/21 1810 01/25/21 1300  BNP 99.4 50.3 123.1*    ProBNP (last 3 results) No results for input(s): PROBNP in the last 8760 hours.  CBG: No results for input(s): GLUCAP in the last 168 hours.     Signed:  Alma Friendly, MD [facility] 02/22/2021, 11:23 AM

## 2021-02-22 NOTE — Telephone Encounter (Signed)
Dr Dorris Fetch, I received a hospital referral on this patient for Intractable nausea and vomiting, the only thing I can find in the labs that are abnormal is a Cortisol. Do you want to see this patient?

## 2021-02-23 LAB — CULTURE, BLOOD (ROUTINE X 2)
Culture: NO GROWTH
Culture: NO GROWTH

## 2021-02-23 NOTE — Progress Notes (Signed)
EPIC Encounter for ICM Monitoring  Patient Name: Erin [name] is a 60 y.o. female Date: 02/23/2021 Primary Care Physican: [name], MD Primary Cardiologist: Aundra Dubin Electrophysiologist: Curt Bears 01/25/2021 Office Weight: 117 lbs                                                           Spoke with patient and heart failure questions reviewed.  Pt asymptomatic for fluid accumulation.   She was hospitalized 10/12-10/17 for low BP.  She possibly has Addison's disease.    Optivol thoracic impedance suggesting normal fluid levels with exception of possible fluid accumulation during hospitalization.    Prescribed: Furosemide 20 mg Take 1 tablets (20 mg total) by mouth daily.   Labs: 02/22/2021 Creatinine 0.99, BUN 13, Potassium 4.1, Sodium 141, GFR >60 02/21/2021 Creatinine 1.24, BUN 14, Potassium 3.7, Sodium 138, GFR >50  02/20/2021 Creatinine 0.92, BUN 11, Potassium 3.9, Sodium 140, GFR > 60 02/19/2021 Creatinine 0.93, BUN 12, Potassium 4.0, Sodium 140, GFR >60  02/18/2021 Creatinine 1.05, BUN 21, Potassium 3.2, Sodium 135, GFR >60  02/15/2021 Creatinine 1.32, BUN 26, Potassium 4.2, Sodium 139  02/02/2021 Creatinine 1.24, BUN 21, Potassium 4.4, Sodium 137  01/25/2021 Creatinine 0.93, BUN 14, Potassium 3.6, Sodium 139, GFR >60 01/06/2021 Creatinine 1.10, BUN 16, Potassium 4.1, Sodium 137, GFR 58 01/04/2021 Creatinine 1.37, BUN 16, Potassium 4.1, Sodium 140, GFR 45  01/03/2021 Creatinine 1.72, BUN 18, Potassium 3.1, Sodium 137, GFR 34  01/02/2021 Creatinine 1.00, BUN 17, Potassium 3.7, Sodium 136, GFR >60  A complete set of results can be found in Results Review.   Recommendations: No changes and encouraged to call if experiencing any fluid symptoms.   Follow-up plan: ICM clinic phone appointment on 04/06/2021.   91 day device clinic remote transmission 04/05/2021.     EP/Cardiology Office Visits:  03/12/2021 with Dr Aundra Dubin.   Copy of ICM check sent to Dr. Curt Bears.    3 month ICM  trend: 02/22/2021.    1 Year ICM trend:       Rosalene Billings, RN 02/23/2021 3:27 PM

## 2021-02-24 LAB — IGA: IgA: 168 mg/dL (ref 87–352)

## 2021-02-24 LAB — TISSUE TRANSGLUTAMINASE, IGA: Tissue Transglutaminase Ab, IgA: <2 U/mL (ref 0–3)

## 2021-03-01 LAB — 5 HIAA, QUANTITATIVE, URINE, 24 HOUR
5-HIAA, Ur: 1.3 mg/L
5-HIAA,Quant.,24 Hr Urine: 3.3 mg/24 hr (ref 0.0–14.9)
Total Volume: 2500

## 2021-03-02 ENCOUNTER — Other Ambulatory Visit: Payer: Self-pay | Admitting: *Deleted

## 2021-03-02 ENCOUNTER — Encounter: Payer: Self-pay | Admitting: "Endocrinology

## 2021-03-02 ENCOUNTER — Ambulatory Visit (INDEPENDENT_AMBULATORY_CARE_PROVIDER_SITE_OTHER): Payer: Commercial Managed Care - PPO | Admitting: "Endocrinology

## 2021-03-02 ENCOUNTER — Other Ambulatory Visit: Payer: Self-pay

## 2021-03-02 VITALS — BP 128/82 | HR 76 | Ht 60.0 in | Wt 112.2 lb

## 2021-03-02 DIAGNOSIS — E2749 Other adrenocortical insufficiency: Secondary | ICD-10-CM | POA: Diagnosis not present

## 2021-03-02 DIAGNOSIS — D3502 Benign neoplasm of left adrenal gland: Secondary | ICD-10-CM | POA: Diagnosis not present

## 2021-03-02 DIAGNOSIS — D3501 Benign neoplasm of right adrenal gland: Secondary | ICD-10-CM | POA: Diagnosis not present

## 2021-03-02 DIAGNOSIS — Z91018 Allergy to other foods: Secondary | ICD-10-CM

## 2021-03-02 LAB — MISC LABCORP TEST (SEND OUT): Labcorp test code: 650003

## 2021-03-02 NOTE — Progress Notes (Signed)
Endocrinology Consult Note                                            03/02/2021, 2:26 PM   Subjective:    Patient ID: Erin [name], female    DOB: 27-Jun-1960, PCP [name], MD   Past Medical History:  Diagnosis Date   Anxiety state 03/25/2016   Basal cell carcinoma of forehead    Brain aneurysm    CHF (congestive heart failure) ([place])    Coronary artery disease    a. 03/11/16 PCI with DES-->Prox/Mid Cx;  b. 03/14/16 PCI with DES x2-->RCA, EF 30-35%.   Essential hypertension    GERD (gastroesophageal reflux disease)    HFrEF (heart failure with reduced ejection fraction) ([place])    a. 10/2016 Echo: EF 35-40%, Gr1 DD, mild focal basal septal hypertrophy, basal inflat, mid inflat, basal antlat AK. Mid infept/inf/antlat, apical lateral sev HK. Mod MR. mildly reduced RV fxn. Mild TR.   History of pneumonia    Hyperlipidemia    IBS (irritable bowel syndrome)    Ischemic cardiomyopathy    a. 10/2016 Echo: EF 35-40%, Gr1 DD.   Mitral regurgitation    NSTEMI (non-ST elevated myocardial infarction) ([place]) 03/10/2016   Pneumonia 03/2016   Squamous cell cancer of skin of nose    Thrombocytosis 03/26/2016   Tobacco abuse    Trichimoniasis    Wears dentures    Wears glasses    Past Surgical History:  Procedure Laterality Date   APPENDECTOMY     BIOPSY  09/20/2018   Procedure: BIOPSY;  Surgeon: Daneil Dolin, MD;  [facility];  Service: Endoscopy;;  colon   BIOPSY  01/05/2021   Procedure: BIOPSY;  Surgeon: Milus Banister, MD;  Location: [facility] ENDOSCOPY;  Service: Endoscopy;;   CARDIAC CATHETERIZATION N/A 03/11/2016   Procedure: Left Heart Cath and Coronary Angiography;  Surgeon: Leonie Man, MD;  Location: [place] [facility];  Service: Cardiovascular;  Laterality: N/A;   CARDIAC CATHETERIZATION N/A 03/11/2016   Procedure: Coronary Stent Intervention;  Surgeon: Leonie Man, MD;  Location: [place] [facility];  Service: Cardiovascular;  Laterality: N/A;    CARDIAC CATHETERIZATION N/A 03/14/2016   Procedure: Coronary Stent Intervention;  Surgeon: Peter M Martinique, MD;  Location: [place] [facility];  Service: Cardiovascular;  Laterality: N/A;   CHOLECYSTECTOMY OPEN  1984   COLONOSCOPY WITH PROPOFOL N/A 09/20/2018   Procedure: COLONOSCOPY WITH PROPOFOL;  Surgeon: Daneil Dolin, MD;  [facility];  Service: Endoscopy;  Laterality: N/A;  10:30am   CORONARY ANGIOPLASTY WITH STENT PLACEMENT  03/14/2016   ESOPHAGOGASTRODUODENOSCOPY (EGD) WITH PROPOFOL N/A 09/20/2018   Procedure: ESOPHAGOGASTRODUODENOSCOPY (EGD) WITH PROPOFOL;  Surgeon: Daneil Dolin, MD;  [facility];  Service: Endoscopy;  Laterality: N/A;   ESOPHAGOGASTRODUODENOSCOPY (EGD) WITH PROPOFOL N/A 01/05/2021   Procedure: ESOPHAGOGASTRODUODENOSCOPY (EGD) WITH PROPOFOL;  Surgeon: Milus Banister, MD;  Location: [facility] ENDOSCOPY;  Service: Endoscopy;  Laterality: N/A;   FINGER ARTHROPLASTY Left 05/14/2013   Procedure: LEFT THUMB CARPAL METACARPAL ARTHROPLASTY;  Surgeon: Tennis Must, MD;  Location: [place];  Service: Orthopedics;  Laterality: Left;   ICD IMPLANT N/A 04/03/2020   Procedure: ICD IMPLANT;  Surgeon: Constance Haw, MD;  Location: [place] [facility];  Service: Cardiovascular;  Laterality: N/A;  IR ANGIO INTRA EXTRACRAN SEL COM CAROTID INNOMINATE BILAT MOD SED  01/05/2017   IR ANGIO INTRA EXTRACRAN SEL COM CAROTID INNOMINATE BILAT MOD SED  03/19/2019   IR ANGIO INTRA EXTRACRAN SEL COM CAROTID INNOMINATE BILAT MOD SED  06/04/2020   IR ANGIO INTRA EXTRACRAN SEL INTERNAL CAROTID UNI L MOD SED  04/15/2019   IR ANGIO VERTEBRAL SEL VERTEBRAL BILAT MOD SED  01/05/2017   IR ANGIO VERTEBRAL SEL VERTEBRAL BILAT MOD SED  03/19/2019   IR ANGIO VERTEBRAL SEL VERTEBRAL UNI L MOD SED  06/04/2020   IR ANGIOGRAM FOLLOW UP STUDY  04/15/2019   IR RADIOLOGIST EVAL & MGMT  12/30/2016   IR TRANSCATH/EMBOLIZ  04/15/2019   IR US GUIDE VASC ACCESS RIGHT  03/19/2019   IR US  GUIDE VASC ACCESS RIGHT  06/04/2020   MALONEY DILATION N/A 09/20/2018   Procedure: Venia Minks DILATION;  Surgeon: Daneil Dolin, MD;  [facility];  Service: Endoscopy;  Laterality: N/A;   RADIOLOGY WITH ANESTHESIA N/A 04/15/2019   Procedure: [employer];  Surgeon: Luanne Bras, MD;  Location: [place];  Service: Radiology;  Laterality: N/A;   RIGHT/LEFT HEART CATH AND CORONARY ANGIOGRAPHY N/A 08/19/2019   Procedure: RIGHT/LEFT HEART CATH AND CORONARY ANGIOGRAPHY;  Surgeon: Larey Dresser, MD;  Location: [place] [facility];  Service: Cardiovascular;  Laterality: N/A;   TUBAL LIGATION  1987   VAGINAL HYSTERECTOMY  2009   Social History   Socioeconomic History   Marital status: Married    Spouse name: Not on file   Number of children: Not on file   Years of education: Not on file   Highest education level: Not on file  Occupational History   Occupation: CNA  Tobacco Use   Smoking status: Some Days    Packs/day: 0.10    Years: 15.00    Pack years: 1.50    Types: Cigarettes   Smokeless tobacco: Never   Tobacco comments:    smokes a cigarette occasionally  Vaping Use   Vaping Use: Never used  Substance and Sexual Activity   Alcohol use: Yes    Comment: occasionally   Drug use: Not Currently    Types: Marijuana    Comment: former- 2017 last time   Sexual activity: Not Currently    Birth control/protection: Surgical    Comment: hyst  Other Topics Concern   Not on file  Social History Narrative   Lives with husband in [place] in a one story home with a basement.  Has 4 children.  Works as a [employer]ar[employer] manager.  Education: CNA school.    Social Determinants of Health   Financial Resource Strain: Not on file  Food Insecurity: Not on file  Transportation Needs: Not on file  Physical Activity: Not on file  Stress: Not on file  Social Connections: Not on file   Family History  Problem Relation Age of Onset   Stroke Mother    Hypertension Mother    Diabetes Mother    Heart  attack Mother    Heart attack Father    Diabetes Father    Hypertension Father    CAD Father    Colon polyps Father 98       pre-cancerous    Stroke Father    Dementia Father    Hyperlipidemia Father    Breast cancer Maternal Grandmother    Cancer Maternal Grandfather        Tongue and esophageal   Heart failure Other    Colon cancer  Neg Hx    Outpatient Encounter Medications as of 03/02/2021  Medication Sig   ALPRAZolam (XANAX) 1 MG tablet Take 1 mg by mouth See admin instructions. Take 1 mg by mouth at bedtime and an additional 1 mg daily as needed for anxiety   aspirin EC 81 MG tablet Take 81 mg by mouth every evening.    atorvastatin (LIPITOR) 80 MG tablet Take 1 tablet (80 mg total) by mouth daily at 6 PM.   carvedilol (COREG) 3.125 MG tablet Take 1 tablet (3.125 mg total) by mouth 2 (two) times daily.   Cholecalciferol (VITAMIN D-3 PO) Take 1 capsule by mouth daily with breakfast.   clopidogrel (PLAVIX) 75 MG tablet Take 1 tablet (75 mg total) by mouth daily.   dapagliflozin propanediol (FARXIGA) 10 MG TABS tablet Take 1 tablet (10 mg total) by mouth daily before breakfast.   ezetimibe (ZETIA) 10 MG tablet Take 1 tablet (10 mg total) by mouth daily.   furosemide (LASIX) 20 MG tablet Take 1 tablet (20 mg total) by mouth daily.   gabapentin (NEURONTIN) 100 MG capsule Take 200 mg by mouth at bedtime.   loperamide (IMODIUM) 2 MG capsule Take 2 mg by mouth 4 (four) times daily as needed for diarrhea or loose stools (ibs).    Multiple Vitamins-Minerals (MULTIVITAMIN WITH MINERALS) tablet Take 1 tablet by mouth daily.   nicotine (NICODERM CQ - DOSED IN MG/24 HOURS) 21 mg/24hr patch Place 21 mg onto the skin daily.   nitroGLYCERIN (NITROSTAT) 0.4 MG SL tablet Place 1 tablet (0.4 mg total) under the tongue every 5 (five) minutes x 3 doses as needed for chest pain (if no relief after 2nd dose, proceed to the ED for an evaluation or call 911).   omeprazole (PRILOSEC) 20 MG capsule Take 1  capsule (20 mg total) by mouth daily.   rOPINIRole (REQUIP) 0.5 MG tablet Take 0.5 mg by mouth at bedtime.    spironolactone (ALDACTONE) 25 MG tablet Take 1 tablet (25 mg total) by mouth at bedtime.   zinc gluconate 50 MG tablet Take 50 mg by mouth daily.   No facility-administered encounter medications on file as of 03/02/2021.   ALLERGIES: Allergies  Allergen Reactions   Tape Other (See Comments)    PEELS SKIN OFF  (PAPER TAPE IS FINE)    VACCINATION STATUS: Immunization History  Administered Date(s) Administered   Influenza,inj,Quad PF,6+ Mos 02/22/2017   Pneumococcal Polysaccharide-23 08/05/2016    HPI Erin [name] is 60 y.o. female who presents today with a medical history as above.  -History is obtained directly from the patient as well as her chart review.  She has multiple medical problems as above including ischemic cardiomyopathy with CHF.  She was recently hospitalized with symptoms including nausea/vomiting, unintended weight loss.  Her in-house labs showed hypercortisolemia. Patient reports that she was not given any steroids.  Her labs on October 13 showed plasma cortisol of 43.4 elevated Dron at 11:18 AM.  However, her repeat a.m. cortisol on February 20, 2021 was 5.9 drawn at 4:57 AM.   Review of her medical records did not reveal steroid exposure.  She was not discharged on steroids.  She denies any prior history of adrenal insufficiency.  She is known to have right adrenal stable adenoma. She denies intra-abdominal infection, surgery nor trauma.  She denies any history of exposure to high-dose steroids.  She is on Farxiga 10 mg p.o. daily not because of diabetes, but given by her cardiologist to assist with  CHF. She admits to ongoing smoking, on nicotine replacement patch as well. Patient reports unintended weight loss of approximately 10 pounds, she currently weighs 112 pounds.  The most she weighed in recent years is 127 pounds.  Review of  Systems  Constitutional: + recent weight loss, +fatigue, no subjective hyperthermia, no subjective hypothermia Eyes: no blurry vision, no xerophthalmia ENT: no sore throat, no nodules palpated in throat, no dysphagia/odynophagia, no hoarseness Cardiovascular: no Chest Pain, no Shortness of Breath, no palpitations, no leg swelling Respiratory: no cough, no shortness of breath Gastrointestinal: no Nausea/Vomiting/Diarhhea Musculoskeletal: no muscle/joint aches Skin: no rashes, + easy bruises Neurological: no tremors, no numbness, no tingling, no dizziness Psychiatric: no depression, no anxiety  Objective:    Vitals with BMI 03/02/2021 02/22/2021 02/22/2021  Height 5\' 0"  - -  Weight 112 lbs 3 oz - -  BMI 85.27 - -  Systolic 782 423 536  Diastolic 82 80 90  Pulse 76 89 77    BP 128/82   Pulse 76   Ht 5' (1.524 m)   Wt 112 lb 3.2 oz (50.9 kg)   BMI 21.91 kg/m   Wt Readings from Last 3 Encounters:  03/02/21 112 lb 3.2 oz (50.9 kg)  02/18/21 117 lb 11.6 oz (53.4 kg)  02/08/21 116 lb 9.6 oz (52.9 kg)    Physical Exam  Constitutional:  Body mass index is 21.91 kg/m.,  not in acute distress, normal state of mind Eyes: PERRLA, EOMI, no exophthalmos ENT: moist mucous membranes, no gross thyromegaly, no gross cervical lymphadenopathy Cardiovascular: normal precordial activity, Regular Rate and Rhythm, no Murmur/Rubs/Gallops Respiratory:  adequate breathing efforts, no gross chest deformity, Clear to auscultation bilaterally Gastrointestinal: abdomen soft, Non -tender, No distension, Bowel Sounds present, no gross organomegaly Musculoskeletal: no gross deformities, strength intact in all four extremities Skin: moist, warm, no rashes, + bruises, +ecchymosis Neurological: no tremor with outstretched hands, Deep tendon reflexes normal in bilateral lower extremities.  CMP ( most recent) CMP     Component Value Date/Time   NA 141 02/22/2021 0548   NA 139 02/15/2021 0825   K 4.1  02/22/2021 0548   CL 106 02/22/2021 0548   CO2 29 02/22/2021 0548   GLUCOSE 97 02/22/2021 0548   BUN 13 02/22/2021 0548   BUN 26 (H) 02/15/2021 0825   CREATININE 0.99 02/22/2021 0548   CREATININE 0.73 05/13/2016 1020   CALCIUM 9.7 02/22/2021 0548   PROT 5.8 (L) 02/20/2021 0457   ALBUMIN 3.2 (L) 02/20/2021 0457   AST 16 02/20/2021 0457   ALT 15 02/20/2021 0457   ALKPHOS 57 02/20/2021 0457   BILITOT 1.1 02/20/2021 0457   GFRNONAA >60 02/22/2021 0548   GFRAA >60 01/07/2020 1816     Diabetic Labs (most recent): Lab Results  Component Value Date   HGBA1C 5.6 10/20/2020   HGBA1C 5.9 (H) 12/10/2019   HGBA1C 5.9 (H) 08/12/2019     Lipid Panel ( most recent) Lipid Panel     Component Value Date/Time   CHOL 99 10/20/2020 0508   TRIG 278 (H) 10/20/2020 0508   HDL 36 (L) 10/20/2020 0508   CHOLHDL 2.8 10/20/2020 0508   VLDL 56 (H) 10/20/2020 0508   LDLCALC 7 10/20/2020 0508      Lab Results  Component Value Date   TSH 0.424 02/19/2021   TSH 0.674 12/10/2019   TSH 0.60 06/26/2017   TSH 0.944 08/04/2016   TSH 0.937 05/16/2016   TSH 0.366 03/24/2016   FREET4 1.06 12/09/2019  FREET4 0.72 06/26/2017    Results for SUKHMANI, FETHEROLF (MRN [id number]) as of 03/02/2021 14:31  Ref. Range 06/26/2017 11:28 02/18/2021 11:18 02/20/2021 04:57  Cortisol, Plasma Latest Units: ug/dL 7.4 43.4   Cortisol - AM Latest Ref Range: 6.7 - 22.6 ug/dL   5.9 (L)   Abdominal CT from February 18, 2021 showed a subcentimeter right adrenal nodule unchanged since 2018.  Also noted was a left adrenal nodule measuring 2 .6 cm unchanged since 2018.  Nodule was characterized as adenoma in the past.  Assessment & Plan:   1. Hypocortisolemia [facility])  - Lovelle J Ruppe  is being seen at a kind request of [name], MD. - I have reviewed her available adrenal records and clinically evaluated the patient. - Based on these reviews, she has hypocortisolemia,  however,  there is not sufficient information to  proceed with definitive treatment plan.  She does not have a confirmed diagnosis of adrenal insufficiency as of yet. Her labs also showed hypercortisolemia of 43 , 2 days prior to her a.m. cortisol measurement of 5.9. She will be considered for ACTH stimulation test.  Her baseline cortisol will also include ACTH. -If she is confirmed to have adrenal insufficiency, she will be considered for steroid replacement therapy.  -The bilateral adrenal nodules are characterized benign adenomas. Given her intermittent dizziness, she would benefit from lowering her [name] being prescribed by her other cardiologist.  I suggested to give her the lowest dose possible which will be 5 mg in her case.  She will discuss this decision during her next visit in 1 to 2 weeks time.  - I did not initiate any new prescriptions today. - she is advised to maintain close follow up with [name], MD for primary care needs.   - Time spent with the patient: 60 minutes, of which >50% was spent in  counseling her about her hypocortisolemia and the rest in obtaining information about her symptoms, reviewing her previous labs/studies ( including abstractions from other facilities),  evaluations, and treatments,  and developing a plan to confirm diagnosis and long term treatment based on the latest standards of care/guidelines; and documenting her care.  Erin [name] participated in the discussions, expressed understanding, and voiced agreement with the above plans.  All questions were answered to her satisfaction. she is encouraged to contact clinic should she have any questions or concerns prior to her return visit.  Follow up plan: Return in about 3 days (around 03/05/2021) for F/U with Pre-visit Labs.   Glade Lloyd, MD [facility] [address] [place], [place] [address] Phone: [phone]  Fax: [phone]     03/02/2021, 2:26 PM  This note was partially  dictated with voice recognition software. Similar sounding words can be transcribed inadequately or may not  be corrected upon review.

## 2021-03-05 ENCOUNTER — Ambulatory Visit: Payer: Commercial Managed Care - PPO | Admitting: "Endocrinology

## 2021-03-09 ENCOUNTER — Encounter (HOSPITAL_COMMUNITY)
Admission: RE | Admit: 2021-03-09 | Discharge: 2021-03-09 | Disposition: A | Discharge status: Home / Self Care | Payer: 59 | Source: Ambulatory Visit | Attending: "Endocrinology | Admitting: "Endocrinology

## 2021-03-09 ENCOUNTER — Encounter (HOSPITAL_COMMUNITY): Payer: Self-pay

## 2021-03-09 DIAGNOSIS — E2740 Unspecified adrenocortical insufficiency: Secondary | ICD-10-CM | POA: Diagnosis not present

## 2021-03-09 LAB — ACTH STIMULATION, 3 TIME POINTS
Cortisol, 30 Min: 38.9 ug/dL
Cortisol, 60 Min: 52.3 ug/dL
Cortisol, Base: 11 ug/dL

## 2021-03-09 MED ORDER — SODIUM CHLORIDE FLUSH 0.9 % IV SOLN
INTRAVENOUS | Status: AC
  Filled 2021-03-09: qty 10

## 2021-03-09 MED ORDER — COSYNTROPIN 0.25 MG IJ SOLR
0.2500 mg | Freq: Once | INTRAMUSCULAR | Status: AC
  Administered 2021-03-09: 0.25 mg via INTRAVENOUS

## 2021-03-09 MED ORDER — COSYNTROPIN 0.25 MG IJ SOLR
INTRAMUSCULAR | Status: AC
  Filled 2021-03-09: qty 0.25

## 2021-03-10 ENCOUNTER — Other Ambulatory Visit: Payer: Self-pay | Admitting: *Deleted

## 2021-03-10 DIAGNOSIS — K769 Liver disease, unspecified: Secondary | ICD-10-CM

## 2021-03-11 ENCOUNTER — Telehealth: Payer: Self-pay | Admitting: *Deleted

## 2021-03-11 NOTE — Telephone Encounter (Addendum)
PA done via [facility]. Approved for MRI auth# Order ID: [id number]    Approval Valid Through: 03/11/2021 - 04/09/2021  Patient is not scheduled until 12/23 and it will not allow me to go past 30 days. Will call back later in month to update dates of service.

## 2021-03-12 ENCOUNTER — Other Ambulatory Visit: Payer: Self-pay

## 2021-03-12 ENCOUNTER — Ambulatory Visit (HOSPITAL_COMMUNITY)
Admission: RE | Admit: 2021-03-12 | Discharge: 2021-03-12 | Disposition: A | Discharge status: Home / Self Care | Payer: 59 | Source: Ambulatory Visit | Attending: Cardiology | Admitting: Cardiology

## 2021-03-12 ENCOUNTER — Ambulatory Visit (HOSPITAL_BASED_OUTPATIENT_CLINIC_OR_DEPARTMENT_OTHER)
Admission: RE | Admit: 2021-03-12 | Discharge: 2021-03-12 | Disposition: A | Discharge status: Home / Self Care | Payer: 59 | Source: Ambulatory Visit | Attending: Cardiology | Admitting: Cardiology

## 2021-03-12 ENCOUNTER — Encounter (HOSPITAL_COMMUNITY): Payer: Self-pay

## 2021-03-12 VITALS — BP 150/106 | HR 78 | Ht 60.0 in | Wt 115.2 lb

## 2021-03-12 DIAGNOSIS — I110 Hypertensive heart disease with heart failure: Secondary | ICD-10-CM | POA: Diagnosis not present

## 2021-03-12 DIAGNOSIS — I5042 Chronic combined systolic (congestive) and diastolic (congestive) heart failure: Secondary | ICD-10-CM | POA: Insufficient documentation

## 2021-03-12 DIAGNOSIS — I252 Old myocardial infarction: Secondary | ICD-10-CM | POA: Insufficient documentation

## 2021-03-12 DIAGNOSIS — I255 Ischemic cardiomyopathy: Secondary | ICD-10-CM | POA: Insufficient documentation

## 2021-03-12 DIAGNOSIS — I2510 Atherosclerotic heart disease of native coronary artery without angina pectoris: Secondary | ICD-10-CM | POA: Diagnosis not present

## 2021-03-12 DIAGNOSIS — I340 Nonrheumatic mitral (valve) insufficiency: Secondary | ICD-10-CM | POA: Insufficient documentation

## 2021-03-12 DIAGNOSIS — E785 Hyperlipidemia, unspecified: Secondary | ICD-10-CM | POA: Insufficient documentation

## 2021-03-12 DIAGNOSIS — Z95810 Presence of automatic (implantable) cardiac defibrillator: Secondary | ICD-10-CM | POA: Diagnosis not present

## 2021-03-12 DIAGNOSIS — Z720 Tobacco use: Secondary | ICD-10-CM | POA: Diagnosis not present

## 2021-03-12 LAB — ECHOCARDIOGRAM COMPLETE
Area-P 1/2: 2.96 cm2
MV M vel: 5.07 m/s
MV Peak grad: 102.8 mmHg
Radius: 0.8 cm
S' Lateral: 4.6 cm

## 2021-03-12 MED ORDER — LOSARTAN POTASSIUM 25 MG PO TABS: 25.0000 mg | ORAL_TABLET | Freq: Every day | ORAL | 3 refills | Status: DC

## 2021-03-12 NOTE — Progress Notes (Signed)
[facility] Progress Note   PCP: [name], MD HF Cardiology: [name]  Reason for Visit: Heart Failure   60 y.o. with history of CAD, ischemic cardiomyopathy, and PAD was referred by [name] for evaluation of CHF.  She had NSTEMI in 11/17 with PCI to prox/mid LCx and RCA.  Subsequently, has developed ischemic cardiomyopathy.  Most recent echo in 1/21 showed EF 30-35%.  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   RHC/LHC done with exertional chest heaviness and dyspnea in 4/21 showed normal filling pressures, preserved cardiac output, and nonobstructive mild CAD.  ABIs were normal in 4/21.   In 8/21, she had syncope thought to be related to orthostasis from low BP in the setting of cardiac medication titration.  Entresto was decreased.   She was in the ER in 11/21 with chest pain.  Troponin and COVID-19 negative. Echo in 11/21 showed EF 30-35%, normal RV.   She was hospitalized in 6/22 with left-sided weakness/numbness.  No evidence for acute CVA, ?TIA.  She is on ASA and Plavix.   Admitted 8/28-8/31/22 with hypotension and AKI 2/2 intractable nausea and vomiting. HF medications initially held. GI consulted and underwent  EGD which showed mild nonspecific distal gastritis with biopsies taken and alpha gal labs drawn. Her AKI resolved, SCr 1.7>>1.1. All HF meds, except for lasix, held on discharge due to low BP.   Last seen in the HF clinic 01/25/21. Arlyce Harman and farxiga restarted. Later that day she was having abdominal pain and went to the ED. CT abd with incidental liver lesion but no other acute findings. Recommendations to follow up with GI. Discharged the same day.    Presented to ED on 02/18/21 N/V and dizziness. Also had SBP in the 70s. Given 2 liters [facility]. CT of abd with liver lesions. She was referred for PET scan to assess lesions.   Today she returns for HF follow up.Overall feeling fine. SOB with steps. Denies  PND/Orthopnea. Appetite ok. No fever or chills. Weight at home stable.Taking all medications.  Smoking 1 pack of cigarettes a month. Working as Psychologist, counselling at [employer]. She has 10 children that live at home.   Labs (2/21): K 3.7, creatinine 0.82 Labs (4/21): K 4.3, creatinine 0.81, LDL 67, TGs 286 Labs (6/21): K 4.7, creatinine 1.22 Labs (11/21): K 3.7, creatinine 0.84, hgb 12.6, LDL 67, HDL 48, TGs 286 Labs (1/22). K 4.4, creatinine 1.03 Labs (6/22): K 3.5, creatinine 1.0, LDL 7 Labs (8/22): K 4.1, creatinine 1.10  Labs (02/02/21): K 4.4 Creatinine 1.24   Medtronic device interrogation: Impedance up. Activity 2 hours per day. No VT.   PMH: 1. CAD: NSTEMI with DES to proximal and mid LCx in 11/17, staged DES x 2 to RCA later in 11/17.  - Cardiolite (10/20): EF < 30%, large inferior and inferolateral MI with mild peri-infarct ischemia.  - LHC (4/21): Patent stents, nonobstructive CAD.  2. Chronic systolic CHF: Ischemic cardiomyopathy. Medtronic ICD.  - Echo (10/20): EF 35-40%, lateral WMAs, normal RV. - Echo (1/21): EF 30-35%, mild LVH, normal RV - RHC (4/21): mean RA 5, PA 29/3, mean PCWP 12, CI 3.04 - Echo (11/21): EF 30-35%, normal RV.  3. 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  4. Carotid stenosis: Carotid dopplers (60/73) with 71-06% LICA stenosis.  - Carotid dopplers (6/21): 40-59% BICA stenosis.  - Carotid dopplers (1/22): 26-94% RICA, 85-46% LICA.  5. Active smoker.  6. PAD: ABIs (4/21) Normal.  - Peripheral artery dopplers (12/21): bilateral plaque without focal stenosis.   Social History   Socioeconomic History   Marital status: Married    Spouse name: Not on file   Number of children: Not on file   Years of education: Not on file   Highest education level: Not on file  Occupational History   Occupation: CNA  Tobacco Use   Smoking status: Some Days    Packs/day: 0.10    Years: 15.00    Pack years: 1.50    Types:  Cigarettes   Smokeless tobacco: Never   Tobacco comments:    smokes a cigarette occasionally  Vaping Use   Vaping Use: Never used  Substance and Sexual Activity   Alcohol use: Yes    Comment: occasionally   Drug use: Not Currently    Types: Marijuana    Comment: former- 2017 last time   Sexual activity: Not Currently    Birth control/protection: Surgical    Comment: hyst  Other Topics Concern   Not on file  Social History Narrative   Lives with husband in [place] in a one story home with a basement.  Has 4 children.  Works as a Quarry manager.  Education: CNA school.    Social Determinants of Health   Financial Resource Strain: Not on file  Food Insecurity: Not on file  Transportation Needs: Not on file  Physical Activity: Not on file  Stress: Not on file  Social Connections: Not on file  Intimate Partner Violence: Not on file   Family History  Problem Relation Age of Onset   Stroke Mother    Hypertension Mother    Diabetes Mother    Heart attack Mother    Heart attack Father    Diabetes Father    Hypertension Father    CAD Father    Colon polyps Father 17       pre-cancerous    Stroke Father    Dementia Father    Hyperlipidemia Father    Breast cancer Maternal Grandmother    Cancer Maternal Grandfather        Tongue and esophageal   Heart failure Other    Colon cancer Neg Hx    ROS: All systems reviewed and negative except as per HPI.   Current Outpatient Medications  Medication Sig Dispense Refill   ALPRAZolam (XANAX) 1 MG tablet Take 1 mg by mouth See admin instructions. Take 1 mg by mouth at bedtime and an additional 1 mg daily as needed for anxiety     aspirin EC 81 MG tablet Take 81 mg by mouth every evening.      atorvastatin (LIPITOR) 80 MG tablet Take 1 tablet (80 mg total) by mouth daily at 6 PM. 90 tablet 3   carvedilol (COREG) 3.125 MG tablet Take 1 tablet (3.125 mg total) by mouth 2 (two) times daily. 180 tablet 1   Cholecalciferol (VITAMIN D-3 PO) Take 1  capsule by mouth daily with breakfast.     clopidogrel (PLAVIX) 75 MG tablet Take 1 tablet (75 mg total) by mouth daily. 30 tablet 3   dapagliflozin propanediol (FARXIGA) 10 MG TABS tablet Take 1 tablet (10 mg total) by mouth daily before breakfast. 30 tablet 11   ezetimibe (ZETIA) 10 MG tablet Take 1 tablet (10 mg total) by mouth daily. 30 tablet 6   furosemide (LASIX) 20 MG tablet Take 1 tablet (20 mg total) by mouth daily. 90 tablet 3   gabapentin (  NEURONTIN) 100 MG capsule Take 200 mg by mouth at bedtime.     loperamide (IMODIUM) 2 MG capsule Take 2 mg by mouth 4 (four) times daily as needed for diarrhea or loose stools (ibs).      LORazepam (ATIVAN) 1 MG tablet Take 1 mg by mouth daily as needed.     Multiple Vitamins-Minerals (MULTIVITAMIN WITH MINERALS) tablet Take 1 tablet by mouth daily.     nicotine (NICODERM CQ - DOSED IN MG/24 HOURS) 21 mg/24hr patch Place 21 mg onto the skin daily.     nitroGLYCERIN (NITROSTAT) 0.4 MG SL tablet Place 1 tablet (0.4 mg total) under the tongue every 5 (five) minutes x 3 doses as needed for chest pain (if no relief after 2nd dose, proceed to the ED for an evaluation or call 911). 25 tablet 2   omeprazole (PRILOSEC) 20 MG capsule Take 1 capsule (20 mg total) by mouth daily. 30 capsule 0   ondansetron (ZOFRAN) 4 MG tablet Take 4 mg by mouth 3 (three) times daily as needed.     rOPINIRole (REQUIP) 0.5 MG tablet Take 0.5 mg by mouth at bedtime.   0   spironolactone (ALDACTONE) 25 MG tablet Take 12.5 mg by mouth at bedtime.     zinc gluconate 50 MG tablet Take 50 mg by mouth daily.     No current facility-administered medications for this encounter.   BP (!) 150/106   Pulse 78   Ht 5' (1.524 m)   Wt 52.3 kg (115 lb 3.2 oz)   SpO2 99%   BMI 22.50 kg/m    PHYSICAL EXAM: General:  Well appearing. No resp difficulty HEENT: normal Neck: supple. no JVD. Carotids 2+ bilat; no bruits. No lymphadenopathy or thryomegaly appreciated. Cor: PMI nondisplaced.  Regular rate & rhythm. No rubs, gallops or murmurs. Lungs: clear Abdomen: soft, nontender, nondistended. No hepatosplenomegaly. No bruits or masses. Good bowel sounds. Extremities: no cyanosis, clubbing, rash, edema Neuro: alert & orientedx3, cranial nerves grossly intact. moves all 4 extremities w/o difficulty. Affect pleasant   Assessment/Plan: 1. Chronic Systolic CHF: Ischemic cardiomyopathy. Echo in 11/21 showed stable EF 30-35%.  RHC in 4/21 with normal filling pressures and preserved cardiac output.  GDMT held during recent admission given AKI (resolved) and hypotension. S/p MDT ICD - Echo today EF 30-35% . Dr Haroldine Laws discussed and reviewed today  - NYHA III.  Volume status stable.  - Continue Lasix 20 mg daily  - Continue  Farxiga 10 mg daily  - Continue spiro to 25 mg daily.  - Continue coreg 3.125 mg twice a day.  - Off entresto due to hypotension. Start losartan 25 mg daily. Check BMET in 7 days   2. CAD: S/p NSTEMI in 11/17 with DEX to LCx and DES x 2 to RCA.  Cardiolite in 10/20 with EF <30%, inferior/inferolateral infarct with peri-infarct ischemia. LHC in 4/21 showed nonobstructive mild CAD.   - No chest pain.  - Continue atorvastatin and Zetia. Good LDL 6/22 - Continue ASA 81 and Plavix 75 mg daily for now.  She will likely be a good candidate to replace Plavix with rivaroxaban 2.5 mg bid in the future. She will continue Plavix as long as Dr. Estanislado Pandy feels it is needed post her cranial circulation intervention.  3. Carotid stenosis: Repeat carotid dopplers in 1/23.  4. Smoking: Discussed smoking cessation.  5. 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  - Continue Plavix per Dr. Estanislado Pandy.  6. Liver Lesion  -Noted on  CT abd 01/25/21  -GI actively following  Medtronic Optivol discussed and reviewed.  Follow up with Dr Aundra Dubin in 2 months.   Darrick Grinder, NP-C  03/12/2021  Patient seen and examined with the above-signed Advanced Practice Provider and/or Housestaff.  I personally reviewed laboratory data, imaging studies and relevant notes. I independently examined the patient and formulated the important aspects of the plan. I have edited the note to reflect any of my changes or salient points. I have personally discussed the plan with the patient and/or family.  She is doing fairly well from HF standpoint -  NYHA II-early III. 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 Smoking 1-2 cigs/day. Failed Entresto due to hypotension. BP better today  Echo today EF 30-35% Personally reviewed  General:  Sitting on exam table  No resp difficulty HEENT: normal Neck: supple. no JVD. Carotids 2+ bilat; no bruits. No lymphadenopathy or thryomegaly appreciated. Cor: PMI nondisplaced. Regular rate & rhythm. No rubs, gallops or murmurs. Lungs: decreased throughout Abdomen: soft, nontender, nondistended. No hepatosplenomegaly. No bruits or masses. Good bowel sounds. Extremities: no cyanosis, clubbing, rash, edema Neuro: alert & orientedx3, cranial nerves grossly intact. moves all 4 extremities w/o difficulty. Affect pleasant  Stable NYHA II-early III. Volume status looks good on exam and on ICD interrogation. No VT/AF.  Has failed Entresto due to low BP. Will try losartan 25 daily. Encouraged smoking cessation.   Glori Bickers, MD  5:48 PM

## 2021-03-12 NOTE — Patient Instructions (Signed)
No Labs done today.   START Losartan 25mg  (1 tablet) by mouth daily.   No other medication changes were made. Please continue all current medications as prescribed.  Your physician recommends that you schedule a follow-up appointment in: 7-10 days for a lab only appointment and in 2 months with [name]  If you have any questions or concerns before your next appointment please send [place] a message through [facility] or call our office at [phone].    TO LEAVE A MESSAGE FOR THE NURSE SELECT OPTION 2, PLEASE LEAVE A MESSAGE INCLUDING: YOUR NAME DATE OF BIRTH CALL BACK NUMBER REASON FOR CALL**this is important as we prioritize the call backs  YOU WILL RECEIVE A CALL BACK THE SAME DAY AS LONG AS YOU CALL BEFORE 4:00 PM   Do the following things EVERYDAY: Weigh yourself in the morning before breakfast. Write it down and keep it in a log. Take your medicines as prescribed Eat low salt foods--Limit salt (sodium) to 2000 mg per day.  Stay as active as you can everyday Limit all fluids for the day to less than 2 liters   At the [facility], you and your health needs are our priority. As part of our continuing mission to provide you with exceptional heart care, we have created designated Provider Care Teams. These Care Teams include your primary Cardiologist (physician) and Advanced Practice Providers (APPs- Physician Assistants and Nurse Practitioners) who all work together to provide you with the care you need, when you need it.   You may see any of the following providers on your designated Care Team at your next follow up: Dr Glori Bickers Dr Haynes Kerns, NP Lyda Jester, [name] [name], PharmD   Please be sure to bring in all your medications bottles to every appointment.

## 2021-03-15 ENCOUNTER — Encounter: Payer: Self-pay | Admitting: "Endocrinology

## 2021-03-15 ENCOUNTER — Ambulatory Visit (INDEPENDENT_AMBULATORY_CARE_PROVIDER_SITE_OTHER): Payer: Commercial Managed Care - PPO | Admitting: "Endocrinology

## 2021-03-15 VITALS — BP 82/56 | HR 64 | Ht 60.0 in | Wt 116.2 lb

## 2021-03-15 DIAGNOSIS — D3502 Benign neoplasm of left adrenal gland: Secondary | ICD-10-CM

## 2021-03-15 DIAGNOSIS — E2749 Other adrenocortical insufficiency: Secondary | ICD-10-CM

## 2021-03-15 MED ORDER — DAPAGLIFLOZIN PROPANEDIOL 5 MG PO TABS: 5.0000 mg | ORAL_TABLET | Freq: Every day | ORAL | 1 refills | Status: DC

## 2021-03-15 NOTE — Progress Notes (Signed)
03/15/2021, 12:07 PM  Endocrinology follow-up note   Subjective:    Patient ID: Erin [name], female    DOB: 08-Jun-1960, PCP [name], MD   Past Medical History:  Diagnosis Date   Anxiety state 03/25/2016   Basal cell carcinoma of forehead    Brain aneurysm    CHF (congestive heart failure) ([place])    Coronary artery disease    a. 03/11/16 PCI with DES-->Prox/Mid Cx;  b. 03/14/16 PCI with DES x2-->RCA, EF 30-35%.   Essential hypertension    GERD (gastroesophageal reflux disease)    HFrEF (heart failure with reduced ejection fraction) ([place])    a. 10/2016 Echo: EF 35-40%, Gr1 DD, mild focal basal septal hypertrophy, basal inflat, mid inflat, basal antlat AK. Mid infept/inf/antlat, apical lateral sev HK. Mod MR. mildly reduced RV fxn. Mild TR.   History of pneumonia    Hyperlipidemia    IBS (irritable bowel syndrome)    Ischemic cardiomyopathy    a. 10/2016 Echo: EF 35-40%, Gr1 DD.   Mitral regurgitation    NSTEMI (non-ST elevated myocardial infarction) ([place]) 03/10/2016   Pneumonia 03/2016   Squamous cell cancer of skin of nose    Thrombocytosis 03/26/2016   Tobacco abuse    Trichimoniasis    Wears dentures    Wears glasses    Past Surgical History:  Procedure Laterality Date   APPENDECTOMY     BIOPSY  09/20/2018   Procedure: BIOPSY;  Surgeon: Daneil Dolin, MD;  [facility];  Service: Endoscopy;;  colon   BIOPSY  01/05/2021   Procedure: BIOPSY;  Surgeon: Milus Banister, MD;  Location: [facility] ENDOSCOPY;  Service: Endoscopy;;   CARDIAC CATHETERIZATION N/A 03/11/2016   Procedure: Left Heart Cath and Coronary Angiography;  Surgeon: Leonie Man, MD;  Location: [facility];  Service: Cardiovascular;  Laterality: N/A;   CARDIAC CATHETERIZATION N/A 03/11/2016   Procedure: Coronary Stent Intervention;  Surgeon: Leonie Man, MD;  Location: [facility];  Service: Cardiovascular;  Laterality: N/A;    CARDIAC CATHETERIZATION N/A 03/14/2016   Procedure: Coronary Stent Intervention;  Surgeon: Peter M Martinique, MD;  Location: [facility];  Service: Cardiovascular;  Laterality: N/A;   CHOLECYSTECTOMY OPEN  1984   COLONOSCOPY WITH PROPOFOL N/A 09/20/2018   Procedure: COLONOSCOPY WITH PROPOFOL;  Surgeon: Daneil Dolin, MD;  [facility];  Service: Endoscopy;  Laterality: N/A;  10:30am   CORONARY ANGIOPLASTY WITH STENT PLACEMENT  03/14/2016   ESOPHAGOGASTRODUODENOSCOPY (EGD) WITH PROPOFOL N/A 09/20/2018   Procedure: ESOPHAGOGASTRODUODENOSCOPY (EGD) WITH PROPOFOL;  Surgeon: Daneil Dolin, MD;  [facility];  Service: Endoscopy;  Laterality: N/A;   ESOPHAGOGASTRODUODENOSCOPY (EGD) WITH PROPOFOL N/A 01/05/2021   Procedure: ESOPHAGOGASTRODUODENOSCOPY (EGD) WITH PROPOFOL;  Surgeon: Milus Banister, MD;  Location: [facility] ENDOSCOPY;  Service: Endoscopy;  Laterality: N/A;   FINGER ARTHROPLASTY Left 05/14/2013   Procedure: LEFT THUMB CARPAL METACARPAL ARTHROPLASTY;  Surgeon: Tennis Must, MD;  Location: [facility];  Service: Orthopedics;  Laterality: Left;   ICD IMPLANT N/A 04/03/2020   Procedure: ICD IMPLANT;  Surgeon: Constance Haw, MD;  Location: [facility];  Service: Cardiovascular;  Laterality: N/A;  IR ANGIO INTRA EXTRACRAN SEL COM CAROTID INNOMINATE BILAT MOD SED  01/05/2017   IR ANGIO INTRA EXTRACRAN SEL COM CAROTID INNOMINATE BILAT MOD SED  03/19/2019   IR ANGIO INTRA EXTRACRAN SEL COM CAROTID INNOMINATE BILAT MOD SED  06/04/2020   IR ANGIO INTRA EXTRACRAN SEL INTERNAL CAROTID UNI L MOD SED  04/15/2019   IR ANGIO VERTEBRAL SEL VERTEBRAL BILAT MOD SED  01/05/2017   IR ANGIO VERTEBRAL SEL VERTEBRAL BILAT MOD SED  03/19/2019   IR ANGIO VERTEBRAL SEL VERTEBRAL UNI L MOD SED  06/04/2020   IR ANGIOGRAM FOLLOW UP STUDY  04/15/2019   IR RADIOLOGIST EVAL & MGMT  12/30/2016   IR TRANSCATH/EMBOLIZ  04/15/2019   IR US GUIDE VASC ACCESS RIGHT  03/19/2019   IR US  GUIDE VASC ACCESS RIGHT  06/04/2020   MALONEY DILATION N/A 09/20/2018   Procedure: Venia Minks DILATION;  Surgeon: Daneil Dolin, MD;  [facility];  Service: Endoscopy;  Laterality: N/A;   RADIOLOGY WITH ANESTHESIA N/A 04/15/2019   Procedure: [employer];  Surgeon: Luanne Bras, MD;  Location: [facility];  Service: Radiology;  Laterality: N/A;   RIGHT/LEFT HEART CATH AND CORONARY ANGIOGRAPHY N/A 08/19/2019   Procedure: RIGHT/LEFT HEART CATH AND CORONARY ANGIOGRAPHY;  Surgeon: Larey Dresser, MD;  Location: [facility];  Service: Cardiovascular;  Laterality: N/A;   TUBAL LIGATION  1987   VAGINAL HYSTERECTOMY  2009   Social History   Socioeconomic History   Marital status: Married    Spouse name: Not on file   Number of children: Not on file   Years of education: Not on file   Highest education level: Not on file  Occupational History   Occupation: CNA  Tobacco Use   Smoking status: Some Days    Packs/day: 0.10    Years: 15.00    Pack years: 1.50    Types: Cigarettes   Smokeless tobacco: Never   Tobacco comments:    smokes a cigarette occasionally  Vaping Use   Vaping Use: Never used  Substance and Sexual Activity   Alcohol use: Yes    Comment: occasionally   Drug use: Not Currently    Types: Marijuana    Comment: former- 2017 last time   Sexual activity: Not Currently    Birth control/protection: Surgical    Comment: hyst  Other Topics Concern   Not on file  Social History Narrative   Lives with husband in [place] in a one story home with a basement.  Has 4 children.  Works as a [employer]ar[employer] manager.  Education: CNA school.    Social Determinants of Health   Financial Resource Strain: Not on file  Food Insecurity: Not on file  Transportation Needs: Not on file  Physical Activity: Not on file  Stress: Not on file  Social Connections: Not on file   Family History  Problem Relation Age of Onset   Stroke Mother    Hypertension Mother    Diabetes Mother    Heart  attack Mother    Heart attack Father    Diabetes Father    Hypertension Father    CAD Father    Colon polyps Father 47       pre-cancerous    Stroke Father    Dementia Father    Hyperlipidemia Father    Breast cancer Maternal Grandmother    Cancer Maternal Grandfather        Tongue and esophageal   Heart failure Other    Colon cancer  Neg Hx    Outpatient Encounter Medications as of 03/15/2021  Medication Sig   dapagliflozin propanediol (FARXIGA) 5 MG TABS tablet Take 1 tablet (5 mg total) by mouth daily before breakfast.   ALPRAZolam (XANAX) 1 MG tablet Take 1 mg by mouth See admin instructions. Take 1 mg by mouth at bedtime and an additional 1 mg daily as needed for anxiety   aspirin EC 81 MG tablet Take 81 mg by mouth every evening.    atorvastatin (LIPITOR) 80 MG tablet Take 1 tablet (80 mg total) by mouth daily at 6 PM.   carvedilol (COREG) 3.125 MG tablet Take 1 tablet (3.125 mg total) by mouth 2 (two) times daily.   Cholecalciferol (VITAMIN D-3 PO) Take 1 capsule by mouth daily with breakfast.   clopidogrel (PLAVIX) 75 MG tablet Take 1 tablet (75 mg total) by mouth daily.   ezetimibe (ZETIA) 10 MG tablet Take 1 tablet (10 mg total) by mouth daily.   furosemide (LASIX) 20 MG tablet Take 1 tablet (20 mg total) by mouth daily.   gabapentin (NEURONTIN) 100 MG capsule Take 200 mg by mouth at bedtime.   loperamide (IMODIUM) 2 MG capsule Take 2 mg by mouth 4 (four) times daily as needed for diarrhea or loose stools (ibs).    LORazepam (ATIVAN) 1 MG tablet Take 1 mg by mouth daily as needed.   losartan (COZAAR) 25 MG tablet Take 1 tablet (25 mg total) by mouth daily.   Multiple Vitamins-Minerals (MULTIVITAMIN WITH MINERALS) tablet Take 1 tablet by mouth daily.   nicotine (NICODERM CQ - DOSED IN MG/24 HOURS) 21 mg/24hr patch Place 21 mg onto the skin daily.   nitroGLYCERIN (NITROSTAT) 0.4 MG SL tablet Place 1 tablet (0.4 mg total) under the tongue every 5 (five) minutes x 3 doses as  needed for chest pain (if no relief after 2nd dose, proceed to the ED for an evaluation or call 911).   omeprazole (PRILOSEC) 20 MG capsule Take 1 capsule (20 mg total) by mouth daily.   ondansetron (ZOFRAN) 4 MG tablet Take 4 mg by mouth 3 (three) times daily as needed.   rOPINIRole (REQUIP) 0.5 MG tablet Take 0.5 mg by mouth at bedtime.    spironolactone (ALDACTONE) 25 MG tablet Take 12.5 mg by mouth at bedtime.   zinc gluconate 50 MG tablet Take 50 mg by mouth daily.   [DISCONTINUED] dapagliflozin propanediol (FARXIGA) 10 MG TABS tablet Take 1 tablet (10 mg total) by mouth daily before breakfast.   No facility-administered encounter medications on file as of 03/15/2021.   ALLERGIES: Allergies  Allergen Reactions   Other Shortness Of Breath, Diarrhea, Nausea And Vomiting and Nausea Only    All- red meats   Tape Other (See Comments)    PEELS SKIN OFF  (PAPER TAPE IS FINE)    VACCINATION STATUS: Immunization History  Administered Date(s) Administered   Influenza,inj,Quad PF,6+ Mos 02/22/2017   Pneumococcal Polysaccharide-23 08/05/2016    HPI Erin [name] is 60 y.o. female who presents today with a medical history as above.  She is returning for follow-up after she was seen in consultation for hypercortisolemia during her first visit. See notes from her first visit. She has multiple medical problems as above including ischemic cardiomyopathy with CHF.  She was recently hospitalized with symptoms including nausea/vomiting, unintended weight loss.  Her in-house labs showed hypercortisolemia. She did not have any exposure to steroids.  Her ACTH stim test is negative for adrenal insufficiency.  She has no  new complaints today.  She is regaining her weight she lost prior to her hospitalization.   She denies any prior history of adrenal insufficiency.  She is known to have bilateral adrenal stable adenoma, largest on the left adrenal gland measuring 2.6 cm. She denies intra-abdominal  infection, surgery nor trauma.  She denies any history of exposure to high-dose steroids.  She is on Farxiga 10 mg p.o. daily not because of diabetes, but given by her cardiologist to assist with CHF.  Her blood pressure is low at 82/56, not particularly symptomatic.  She is on multiple medications including Lasix, spironolactone, carvedilol related to her cardiovascular dysfunction.  She admits to ongoing smoking, on nicotine replacement patch as well. Patient reports unintended weight loss of approximately 10 pounds, she currently weighs 116 pounds.  The most she weighed in recent years is 127 pounds.  Review of Systems  Constitutional: + recent weight loss, +fatigue, no subjective hyperthermia, no subjective hypothermia   Objective:    Vitals with BMI 03/15/2021 03/12/2021 03/09/2021  Height 5\' 0"  5\' 0"  -  Weight 116 lbs 3 oz 115 lbs 3 oz -  BMI 39.03 00.9 -  Systolic 82 233 007  Diastolic 56 622 91  Pulse 64 78 86    BP (!) 82/56   Pulse 64   Ht 5' (1.524 m)   Wt 116 lb 3.2 oz (52.7 kg)   BMI 22.69 kg/m   Wt Readings from Last 3 Encounters:  03/15/21 116 lb 3.2 oz (52.7 kg)  03/12/21 115 lb 3.2 oz (52.3 kg)  03/02/21 112 lb 3.2 oz (50.9 kg)    Physical Exam  Constitutional:  Body mass index is 22.69 kg/m.,  not in acute distress, normal state of mind   CMP ( most recent) CMP     Component Value Date/Time   NA 141 02/22/2021 0548   NA 139 02/15/2021 0825   K 4.1 02/22/2021 0548   CL 106 02/22/2021 0548   CO2 29 02/22/2021 0548   GLUCOSE 97 02/22/2021 0548   BUN 13 02/22/2021 0548   BUN 26 (H) 02/15/2021 0825   CREATININE 0.99 02/22/2021 0548   CREATININE 0.73 05/13/2016 1020   CALCIUM 9.7 02/22/2021 0548   PROT 5.8 (L) 02/20/2021 0457   ALBUMIN 3.2 (L) 02/20/2021 0457   AST 16 02/20/2021 0457   ALT 15 02/20/2021 0457   ALKPHOS 57 02/20/2021 0457   BILITOT 1.1 02/20/2021 0457   GFRNONAA >60 02/22/2021 0548   GFRAA >60 01/07/2020 1816     Diabetic Labs  (most recent): Lab Results  Component Value Date   HGBA1C 5.6 10/20/2020   HGBA1C 5.9 (H) 12/10/2019   HGBA1C 5.9 (H) 08/12/2019     Lipid Panel ( most recent) Lipid Panel     Component Value Date/Time   CHOL 99 10/20/2020 0508   TRIG 278 (H) 10/20/2020 0508   HDL 36 (L) 10/20/2020 0508   CHOLHDL 2.8 10/20/2020 0508   VLDL 56 (H) 10/20/2020 0508   LDLCALC 7 10/20/2020 0508      Lab Results  Component Value Date   TSH 0.424 02/19/2021   TSH 0.674 12/10/2019   TSH 0.60 06/26/2017   TSH 0.944 08/04/2016   TSH 0.937 05/16/2016   TSH 0.366 03/24/2016   FREET4 1.06 12/09/2019   FREET4 0.72 06/26/2017    Results for Munce, KENNETTA PAVLOVIC (MRN [id number]) as of 03/02/2021 14:31  Ref. Range 06/26/2017 11:28 02/18/2021 11:18 02/20/2021 04:57  Cortisol, Plasma Latest Units: ug/dL 7.4  43.4   Cortisol - AM Latest Ref Range: 6.7 - 22.6 ug/dL   5.9 (L)   Abdominal CT from February 18, 2021 showed a subcentimeter right adrenal nodule unchanged since 2018.  Also noted was a left adrenal nodule measuring 2 .6 cm unchanged since 2018.  Nodule was characterized as adenoma in the past.   Results for GLORINE, HANRATTY (MRN [id number]) as of 03/15/2021 12:09  Ref. Range 03/09/2021 07:54  Cortisol, Base Latest Units: ug/dL 11.0  Cortisol, 30 Min Latest Units: ug/dL 38.9  Cortisol, 60 Min Latest Units: ug/dL 52.3    Assessment & Plan:   1. Hypocortisolemia ([place]) 2.  Bilateral adrenal adenomas. -Her ACTH stimulation test is negative for adrenal insufficiency.  She will not need intervention with steroids at this time.  I discussed her new labs as well as her previous imaging in detail with her. -The bilateral adrenal nodules are characterized benign adenomas. Given her intermittent dizziness, and marginal blood pressure of 82/56, I discussed and lowered her [name] to 5 mg p.o. daily instead of her current dose of 10 mg.  She already has medications including Lasix, spironolactone and carvedilol  related to her CHF.     - she is advised to maintain close follow up with [name], MD for primary care needs.    I spent 22 minutes in the care of the patient today including review of labs from Thyroid Function, CMP, and other relevant labs ; imaging/biopsy records (current and previous including abstractions from other facilities); face-to-face time discussing  her lab results and symptoms, medications doses, her options of short and long term treatment based on the latest standards of care / guidelines;   and documenting the encounter.  Erin [name]  participated in the discussions, expressed understanding, and voiced agreement with the above plans.  All questions were answered to her satisfaction. she is encouraged to contact clinic should she have any questions or concerns prior to her return visit.   Follow up plan: Return in about 6 months (around 09/12/2021) for F/U with Pre-visit Labs.   Glade Lloyd, MD [facility] [address] [place], [place] [address] Phone: [phone]  Fax: [phone]     03/15/2021, 12:07 PM  This note was partially dictated with voice recognition software. Similar sounding words can be transcribed inadequately or may not  be corrected upon review.

## 2021-03-16 ENCOUNTER — Ambulatory Visit: Payer: 59 | Admitting: Internal Medicine

## 2021-03-16 ENCOUNTER — Telehealth: Payer: Self-pay | Admitting: Internal Medicine

## 2021-03-16 NOTE — Telephone Encounter (Signed)
PATIENT WAS 25 MINUTESLATE FOR HER APPOINTMENT AND WAS RESCHEDULED FOR A December DAY AND SAID THAT WAS RIDICULOUS AND SHE WAS "FINDING ANOTHER GI DOCTOR"

## 2021-03-17 NOTE — Telephone Encounter (Signed)
Noted  

## 2021-03-23 ENCOUNTER — Ambulatory Visit (HOSPITAL_COMMUNITY)
Admission: RE | Admit: 2021-03-23 | Discharge: 2021-03-23 | Disposition: A | Discharge status: Home / Self Care | Payer: 59 | Source: Ambulatory Visit | Attending: Family Medicine | Admitting: Family Medicine

## 2021-03-23 DIAGNOSIS — I5042 Chronic combined systolic (congestive) and diastolic (congestive) heart failure: Secondary | ICD-10-CM | POA: Diagnosis present

## 2021-03-23 LAB — BASIC METABOLIC PANEL
Anion gap: 10 (ref 5–15)
BUN: 23 mg/dL — ABNORMAL HIGH (ref 6–20)
CO2: 25 mmol/L (ref 22–32)
Calcium: 9.4 mg/dL (ref 8.9–10.3)
Chloride: 98 mmol/L (ref 98–111)
Creatinine, Ser: 1.47 mg/dL — ABNORMAL HIGH (ref 0.44–1.00)
GFR, Estimated: 41 mL/min — ABNORMAL LOW (ref >60)
Glucose, Bld: 112 mg/dL — ABNORMAL HIGH (ref 70–99)
Potassium: 3.4 mmol/L — ABNORMAL LOW (ref 3.5–5.1)
Sodium: 133 mmol/L — ABNORMAL LOW (ref 135–145)

## 2021-03-26 ENCOUNTER — Telehealth (HOSPITAL_COMMUNITY): Payer: Self-pay | Admitting: *Deleted

## 2021-03-26 MED ORDER — FUROSEMIDE 20 MG PO TABS: 20.0000 mg | ORAL_TABLET | ORAL | 3 refills | Status: DC | PRN

## 2021-03-26 MED ORDER — POTASSIUM CHLORIDE CRYS ER 20 MEQ PO TBCR: 40.0000 meq | EXTENDED_RELEASE_TABLET | Freq: Once | ORAL | 0 refills | Status: DC

## 2021-03-26 NOTE — Telephone Encounter (Signed)
Harvie Junior, [facility]  03/26/2021 10:41 AM EST Back to Top    Pt returned call she is aware and agreeable with plan. Order for labs mailed to pt.    Maple Mirza, RN  03/26/2021 10:39 AM EST     LM for patient to call back   Kerry Dory, [facility]  03/24/2021 11:20 AM EST     Patient called.  Left message for patient to call back.   Larey Dresser, MD  03/23/2021  9:52 PM EST     Increase K in diet   Fisher, [place]  03/23/2021  1:00 PM EST     Kidney function elevated and potassium low. Please stop lasix and change to PRN weight gain/edema and take 40 mEq of KCL x 1. Repeat BMET in 7-10 days.

## 2021-04-05 ENCOUNTER — Ambulatory Visit (INDEPENDENT_AMBULATORY_CARE_PROVIDER_SITE_OTHER): Payer: 59

## 2021-04-05 DIAGNOSIS — I255 Ischemic cardiomyopathy: Secondary | ICD-10-CM

## 2021-04-05 LAB — CUP PACEART REMOTE DEVICE CHECK
Battery Remaining Longevity: 133 mo
Battery Voltage: 3.03 V
Brady Statistic RV Percent Paced: 0.01 %
Date Time Interrogation Session: 20221128022823
HighPow Impedance: 85 Ohm
Implantable Lead Implant Date: 20211126
Implantable Lead Location: 753860
Implantable Pulse Generator Implant Date: 20211126
Lead Channel Impedance Value: 494 Ohm
Lead Channel Impedance Value: 532 Ohm
Lead Channel Pacing Threshold Amplitude: 0.5 V
Lead Channel Pacing Threshold Pulse Width: 0.4 ms
Lead Channel Sensing Intrinsic Amplitude: 4.625 mV
Lead Channel Sensing Intrinsic Amplitude: 4.625 mV
Lead Channel Setting Pacing Amplitude: 2 V
Lead Channel Setting Pacing Pulse Width: 0.4 ms
Lead Channel Setting Sensing Sensitivity: 0.3 mV

## 2021-04-06 ENCOUNTER — Other Ambulatory Visit (HOSPITAL_COMMUNITY): Payer: Self-pay | Admitting: Cardiology

## 2021-04-06 ENCOUNTER — Ambulatory Visit (INDEPENDENT_AMBULATORY_CARE_PROVIDER_SITE_OTHER): Payer: 59

## 2021-04-06 DIAGNOSIS — I5042 Chronic combined systolic (congestive) and diastolic (congestive) heart failure: Secondary | ICD-10-CM

## 2021-04-06 DIAGNOSIS — Z95810 Presence of automatic (implantable) cardiac defibrillator: Secondary | ICD-10-CM | POA: Diagnosis not present

## 2021-04-08 NOTE — Telephone Encounter (Signed)
Pt MRI PA APPROVAL UPDATED via [employer]. Auth# [id number], DOS 04/08/2021-05/07/2021

## 2021-04-09 NOTE — Progress Notes (Signed)
EPIC Encounter for ICM Monitoring  Patient Name: Erin [name] is a 60 y.o. female Date: 04/09/2021 Primary Care Physican: [name], MD Primary Cardiologist: Aundra Dubin Electrophysiologist: Curt Bears 01/25/2021 Office Weight: 117 lbs 04/09/2021 Weight: 109 lbs                                                            Spoke with patient and heart failure questions reviewed.  Pt is taking Lasix PRN for her symptoms of bloating and SOB.   She has cancer of thigh and will have it removed in January.  Pt has Alpha - Gal and has new specialist appointment on 04/23/2021.  She is having continuous nausea (from Alpha-gal) that is not resolved by zofran and unintentional weight loss (July was 136 lbs and current weight 109 lbs).  Her has difficulty sleeping and with all of the medication conditions she is having more anxiety due to constant change in medication conditions.   Optivol thoracic impedance suggesting possible fluid accumulation since 11/17 (correlates with Furosemide being changed to PRN due to elevated kidney function per 11/15 lab notes).  Impedance returns to baseline at times she takes a PRN Furosemide for symptoms about every 3rd day.    Prescribed: Furosemide 20 mg Take 1 tablet (20 mg total) by mouth as needed for fluid or edema.   Labs: 03/23/2021 Creatinine 1.47, BUN 23, Potassium 3.4, Sodium 133, GFR 41 02/22/2021 Creatinine 0.99, BUN 13, Potassium 4.1, Sodium 141, GFR >60 02/21/2021 Creatinine 1.24, BUN 14, Potassium 3.7, Sodium 138, GFR >50  02/20/2021 Creatinine 0.92, BUN 11, Potassium 3.9, Sodium 140, GFR > 60 02/19/2021 Creatinine 0.93, BUN 12, Potassium 4.0, Sodium 140, GFR >60  02/18/2021 Creatinine 1.05, BUN 21, Potassium 3.2, Sodium 135, GFR >60  A complete set of results can be found in Results Review.   Recommendations:  Advised to take take PRN Furosemide 1 tablet x 1 day and if symptoms persist to take 1 tablet 2nd day.     Follow-up plan: ICM clinic phone appointment  on 04/12/2021 (manual) to recheck fluid levels.   91 day device clinic remote transmission 07/05/2021.     EP/Cardiology Office Visits:  05/13/2021 with Dr Aundra Dubin.   Copy of ICM check sent to Dr. Curt Bears.    3 month ICM trend: 04/09/2021.    12-14 Month ICM trend:       Rosalene Billings, RN 04/09/2021 12:50 PM

## 2021-04-12 ENCOUNTER — Ambulatory Visit: Payer: PRIVATE HEALTH INSURANCE

## 2021-04-12 DIAGNOSIS — I5042 Chronic combined systolic (congestive) and diastolic (congestive) heart failure: Secondary | ICD-10-CM

## 2021-04-12 DIAGNOSIS — Z95810 Presence of automatic (implantable) cardiac defibrillator: Secondary | ICD-10-CM

## 2021-04-13 MED ORDER — FUROSEMIDE 20 MG PO TABS: 20.0000 mg | ORAL_TABLET | Freq: Every day | ORAL | 3 refills | Status: DC

## 2021-04-13 NOTE — Progress Notes (Signed)
Remote ICD transmission.   

## 2021-04-13 NOTE — Progress Notes (Signed)
Take Lasix 20 mg daily, BMET 1 week.

## 2021-04-13 NOTE — Progress Notes (Signed)
EPIC Encounter for ICM Monitoring  Patient Name: Erin [name] is a 60 y.o. female Date: 04/13/2021 Primary Care Physican: [name], MD Primary Cardiologist: Aundra Dubin Electrophysiologist: Curt Bears 01/25/2021 Office Weight: 117 lbs 04/09/2021 Weight: 109 lbs                                                            Spoke with patient and heart failure questions reviewed.  Pt is taking Lasix PRN about every 3 days for her symptoms of bloating and SOB.   She has cancer of thigh and will have it removed in January.  Pt has appt with GI specialist regarding new dx of Alpha - Gal which causes continuous nausea (from Alpha-gal) that is not resolved by zofran and unintentional weight loss (July was 136 lbs and current weight 109 lbs).  Her has difficulty sleeping and with all of the medication conditions she is having more anxiety due to constant change in medication conditions.   Optivol thoracic impedance suggesting possible fluid accumulation since 11/17 that is ongoing even after taking PRN Furosemide for 3-4 days in a row (correlates with Furosemide being changed to PRN due to elevated kidney function per 11/15 lab notes).      Prescribed: Furosemide 20 mg Take 1 tablet (20 mg total) by mouth as needed for fluid or edema.   Labs: 03/23/2021 Creatinine 1.47, BUN 23, Potassium 3.4, Sodium 133, GFR 41 02/22/2021 Creatinine 0.99, BUN 13, Potassium 4.1, Sodium 141, GFR >60 02/21/2021 Creatinine 1.24, BUN 14, Potassium 3.7, Sodium 138, GFR >50  02/20/2021 Creatinine 0.92, BUN 11, Potassium 3.9, Sodium 140, GFR > 60 02/19/2021 Creatinine 0.93, BUN 12, Potassium 4.0, Sodium 140, GFR >60  02/18/2021 Creatinine 1.05, BUN 21, Potassium 3.2, Sodium 135, GFR >60  A complete set of results can be found in Results Review.   Recommendations:  Copy sent to Dr Aundra Dubin for review and recommendations.   Follow-up plan: ICM clinic phone appointment on 04/19/2021 to recheck fluid levels.   91 day device clinic  remote transmission 07/05/2021.     EP/Cardiology Office Visits:  05/13/2021 with Dr Aundra Dubin.   Copy of ICM check sent to Dr. Curt Bears.     3 month ICM trend: 04/12/2021.    12-14 Month ICM trend:       Rosalene Billings, RN 04/13/2021 1:45 PM

## 2021-04-13 NOTE — Progress Notes (Signed)
Spoke with patient and advised Dr Aundra Dubin recommended to take Furosemide 20 mg daily and she verbalized understanding.  Updated epic med list and sent escript.  BMET ordered for 1 week to be drawn at HF clinic 04/20/2021.  Advised to call back if she has any questions or concerns.

## 2021-04-14 LAB — COMPREHENSIVE METABOLIC PANEL
ALT: 14 IU/L (ref 0–32)
AST: 22 IU/L (ref 0–40)
Albumin/Globulin Ratio: 2 (ref 1.2–2.2)
Albumin: 4.2 g/dL (ref 3.8–4.9)
Alkaline Phosphatase: 92 IU/L (ref 44–121)
BUN/Creatinine Ratio: 11 (ref 9–23)
BUN: 11 mg/dL (ref 6–24)
Bilirubin Total: 0.8 mg/dL (ref 0.0–1.2)
CO2: 23 mmol/L (ref 20–29)
Calcium: 9.8 mg/dL (ref 8.7–10.2)
Chloride: 109 mmol/L — ABNORMAL HIGH (ref 96–106)
Creatinine, Ser: 1.01 mg/dL — ABNORMAL HIGH (ref 0.57–1.00)
Globulin, Total: 2.1 g/dL (ref 1.5–4.5)
Glucose: 74 mg/dL (ref 70–99)
Potassium: 4.6 mmol/L (ref 3.5–5.2)
Sodium: 145 mmol/L — ABNORMAL HIGH (ref 134–144)
Total Protein: 6.3 g/dL (ref 6.0–8.5)
eGFR: 65 mL/min/{1.73_m2} (ref >59)

## 2021-04-19 ENCOUNTER — Ambulatory Visit (INDEPENDENT_AMBULATORY_CARE_PROVIDER_SITE_OTHER): Payer: PRIVATE HEALTH INSURANCE

## 2021-04-19 DIAGNOSIS — Z95810 Presence of automatic (implantable) cardiac defibrillator: Secondary | ICD-10-CM

## 2021-04-19 DIAGNOSIS — I5042 Chronic combined systolic (congestive) and diastolic (congestive) heart failure: Secondary | ICD-10-CM

## 2021-04-20 ENCOUNTER — Other Ambulatory Visit (HOSPITAL_COMMUNITY): Payer: PRIVATE HEALTH INSURANCE

## 2021-04-20 ENCOUNTER — Telehealth: Payer: Self-pay

## 2021-04-20 NOTE — Telephone Encounter (Signed)
Spoke with pt and reminded pt of remote transmission that is due today. Pt verbalized understanding.   

## 2021-04-20 NOTE — Progress Notes (Signed)
EPIC Encounter for ICM Monitoring  Patient Name: Erin [name] is a 60 y.o. female Date: 04/20/2021 Primary Care Physican: [name], MD Primary Cardiologist: Aundra Dubin Electrophysiologist: Curt Bears 01/25/2021 Office Weight: 117 lbs 04/09/2021 Weight: 109 lbs                                                            Spoke with patient and heart failure questions reviewed.  Pt remains extremely nauseated due to GI problem.     Optivol thoracic impedance suggesting fluid levels returned to normal after taking Furosemide 20 mg daily.      Prescribed: Furosemide 20 mg Take 1 tablet (20 mg total) by mouth daily   Labs: BMET scheduled 12/15 03/23/2021 Creatinine 1.47, BUN 23, Potassium 3.4, Sodium 133, GFR 41 02/22/2021 Creatinine 0.99, BUN 13, Potassium 4.1, Sodium 141, GFR >60 02/21/2021 Creatinine 1.24, BUN 14, Potassium 3.7, Sodium 138, GFR >50  02/20/2021 Creatinine 0.92, BUN 11, Potassium 3.9, Sodium 140, GFR > 60 02/19/2021 Creatinine 0.93, BUN 12, Potassium 4.0, Sodium 140, GFR >60  02/18/2021 Creatinine 1.05, BUN 21, Potassium 3.2, Sodium 135, GFR >60  A complete set of results can be found in Results Review.   Recommendations:  No changes and encouraged to call if experiencing any fluid symptoms.   Follow-up plan: ICM clinic phone appointment on 05/17/2021.   91 day device clinic remote transmission 07/05/2021.     EP/Cardiology Office Visits:  05/13/2021 with Dr Aundra Dubin.   Copy of ICM check sent to Dr. Curt Bears.    3 month ICM trend: 04/20/2021.    12-14 Month ICM trend:       Rosalene Billings, RN 04/20/2021 2:33 PM

## 2021-04-22 ENCOUNTER — Ambulatory Visit (HOSPITAL_COMMUNITY)
Admission: RE | Admit: 2021-04-22 | Discharge: 2021-04-22 | Disposition: A | Discharge status: Home / Self Care | Payer: 59 | Source: Ambulatory Visit | Attending: Cardiology | Admitting: Cardiology

## 2021-04-22 ENCOUNTER — Other Ambulatory Visit: Payer: Self-pay

## 2021-04-22 DIAGNOSIS — I5042 Chronic combined systolic (congestive) and diastolic (congestive) heart failure: Secondary | ICD-10-CM | POA: Insufficient documentation

## 2021-04-22 LAB — BASIC METABOLIC PANEL
Anion gap: 6 (ref 5–15)
BUN: 12 mg/dL (ref 6–20)
CO2: 27 mmol/L (ref 22–32)
Calcium: 9.9 mg/dL (ref 8.9–10.3)
Chloride: 104 mmol/L (ref 98–111)
Creatinine, Ser: 0.99 mg/dL (ref 0.44–1.00)
GFR, Estimated: >60 mL/min (ref >60)
Glucose, Bld: 110 mg/dL — ABNORMAL HIGH (ref 70–99)
Potassium: 4.1 mmol/L (ref 3.5–5.1)
Sodium: 137 mmol/L (ref 135–145)

## 2021-04-23 ENCOUNTER — Ambulatory Visit (INDEPENDENT_AMBULATORY_CARE_PROVIDER_SITE_OTHER): Payer: Commercial Managed Care - PPO | Admitting: Allergy & Immunology

## 2021-04-23 ENCOUNTER — Encounter: Payer: Self-pay | Admitting: Allergy & Immunology

## 2021-04-23 VITALS — BP 98/62 | HR 88 | Temp 97.7°F | Resp 16 | Ht 61.61 in | Wt 109.6 lb

## 2021-04-23 DIAGNOSIS — T7840XA Allergy, unspecified, initial encounter: Secondary | ICD-10-CM

## 2021-04-23 DIAGNOSIS — L500 Allergic urticaria: Secondary | ICD-10-CM

## 2021-04-23 DIAGNOSIS — T7800XD Anaphylactic reaction due to unspecified food, subsequent encounter: Secondary | ICD-10-CM

## 2021-04-23 DIAGNOSIS — R110 Nausea: Secondary | ICD-10-CM | POA: Diagnosis not present

## 2021-04-23 MED ORDER — EPINEPHRINE 0.3 MG/0.3ML IJ SOAJ: 0.3000 mg | INTRAMUSCULAR | 2 refills | Status: AC | PRN

## 2021-04-23 MED ORDER — CYPROHEPTADINE HCL 4 MG PO TABS: 4.0000 mg | ORAL_TABLET | Freq: Two times a day (BID) | ORAL | 0 refills | Status: DC

## 2021-04-23 NOTE — Progress Notes (Signed)
NEW PATIENT  Date of Service/Encounter:  04/23/21  Consult requested by: [name], MD   Assessment:   Nausea  Anaphylactic shock due to food - alpha gal syndrome with additional sensitization to sesame   Erin [name]'s symptoms do not seem consistent with an allergy at all.  There is no particular trigger in the history and she has no evidence of histamine release such as itching, throat swelling, or wheezing.  Testing today was only positive for sesame as well as red meats, which she already knew about.  She does not eat sesame often at all and I do not think this is relevant.  I am going to get some labs to look for elevated mast cell activity with a serum tryptase.  She is also avoiding cows milk due to this alpha gal allergy.  I am going to get some labs to see if we have a need to do that.  Typically I do not recommend that in patients whose IgE to alpha gal is less than 50 or so.  I think she needs to be seen by gastroenterology again.  We are going to refer her to [place] for a second opinion.  Thankfully, she has had a normal endoscopy and colonoscopy within the last 6 months, which is reassuring.  I am going to start her on Periactin as well as an appetite stimulant as well as an antihistamine.  Again, I do not think this is related to an allergy at all, but the antihistamine would help if an allergy were causing the symptoms.  Plan/Recommendations:    1. Nausea - I am confused as to why you are having this intense nausea. - I am not convinced that this is all related to your alpha gal allergy. - I am going to do a few labs, but I think that you need to see GI. - We will put in a referral and call [facility] to make your appointment happen.  - I am going to start Periactin 4mg  twice daily (this can act as an appetite stimulant).   2. Anaphylactic shock due to food - Testing with positive to lamb and beef (which we knew) and sesame.  - I am not sure that the sesame is  relevant since you do not eat it at all.  - Everything else was negative. - Copy of testing results provided. - There is no need to actively avoid the foods that were negative today (aside from milk and red meats, of course). - Continue to avoid red meat and dairy.  - I am going to get a milk panel to check on your allergy levels to milk. - Alpha gal information provided.  3. Return in about 4 weeks (around 05/21/2021).    This note in its entirety was forwarded to the Provider who requested this consultation.  Subjective:   Erin [name] is a 60 y.o. female presenting today for evaluation of  Chief Complaint  Patient presents with   Other    Has alpha gal     [name] has a history of the following: Patient Active Problem List   Diagnosis Date Noted   Hypocortisolemia ([place]) 03/02/2021   Adrenal adenoma, left 03/02/2021   AKI (acute kidney injury) ([place]) 01/04/2021   Intractable nausea and vomiting 01/03/2021   Left-sided weakness 10/19/2020   Cough 02/11/2020   Near syncope 12/10/2019   Brain aneurysm 04/15/2019   Dysphagia 02/27/2018   Encounter for screening colonoscopy 02/27/2018  History of Clostridium difficile infection 02/27/2018   Chronic diarrhea 12/20/2017   Chronic combined systolic and diastolic congestive heart failure ([place]) 06/20/2016   Hypokalemia due to excessive gastrointestinal loss of potassium    Acute CHF (congestive heart failure) ([place]) 05/16/2016   Acute on chronic systolic CHF (congestive heart failure) ([place]) 05/16/2016   Acute respiratory failure with hypoxia (HCC)    Thrombocytosis 03/26/2016   Cardiomyopathy, ischemic 03/25/2016   Chronic combined systolic and diastolic heart failure ([place]) 03/25/2016   Anxiety state 03/25/2016   Troponin level elevated 03/25/2016   Coronary artery disease involving native coronary artery of native heart 03/25/2016   Normocytic anemia 03/25/2016   SOB (shortness of breath) 03/24/2016    Lightheadedness 03/17/2016   Hypotension 03/17/2016   Tobacco abuse 03/12/2016   NSTEMI (non-ST elevated myocardial infarction) ([place]) 03/11/2016   Atypical chest pain    Essential hypertension 09/06/2015   Mixed hyperlipidemia 09/06/2015   GERD without esophagitis 09/06/2015   Chest pain 09/06/2015    History obtained from: chart review and patient.  Lanny Cramp was referred by [name], MD.     [place] is a 60 y.o. female presenting for an evaluation of nausea and vomiting .  She noticed that she had ticks on July 5th. She had an appt in [place] and had bacon, eggs, and sausage and she started vomiting. She has been sick since that time. Prior to that, she has gotten ticks bites as well.   Following the tick bites, she went to [facility] around one week later. She was in and out of the hospital on a number of different occasions. She has been worked up for Addison's disease. She had a dexamethasone suppression test.   In October 2022, she left her job and was about to eat a hamburger when she got a phone call telling her that she was allergic to red meat. She did not eat it.      She had avoided beef, pork, chicken, and dairy products. She is not taking gel capsules. She is not taking anything that might contain gelatin. She has a gabapentin capsule but she does not take this on a daily basis. She does not go out to eat at all any more. She reports that she now smells food and gets sick. She is on Zofran one tablet up to three times daily. Despite this, she has continued to lose weight.  She mostly eats oatmeal and asparagus.   She does have intermittent hives. She mostly has the GI symptoms with her alpha gal. She will itch occasionally. She gets hypotension with alpha gal exposure. She does NOT take an antihistamine every day.   She had a colonoscopy performed prior to the diagnosis of the alpha gal. She had this done in the early summer 2022 before the onset of symptoms.  Everything was normal. She did have a "stomach infection" that was treated with weeks of antibiotics, but otherwise everything was fine.  She does not get hives from anything at all. There is no particular trigger to her symptoms. Some days she eats only oatmeal. She waited 3 months to get back into his office but her appointment was changed reportedly unbeknownst to you. She did see her in the hospital.   In the hospital, she was just treated with IV nausea medication. Otherwise no workup was done per the patient. She had an abdominal CT that  demonstrated lesions in the liver (see below). MRI of her abdomen is scheduled  for December 23rd.   IMPRESSION: 1. No acute findings in the abdomen or pelvis. Overall no significant interval change since 02/04/2021. 2. Unchanged small hypodense lesion in the liver dome, incompletely characterized. As before, nonemergent outpatient MRI can be considered for characterization. 3. Unchanged trace pericardial effusion.  She does have a history of CHF. She sees Dr. Marigene Ehlers in [facility] for her heart. She had an MI in November 2017 November. Her CHF exacerbations typically present with SOB and fatigue, not the nausea. This is all new since July 2022.   Otherwise, there is no history of other atopic diseases, including drug allergies, environmental allergies, stinging insect allergies, eczema, urticaria, or contact dermatitis. There is no significant infectious history. Vaccinations are up to date.    Past Medical History: Patient Active Problem List   Diagnosis Date Noted   Hypocortisolemia ([place]) 03/02/2021   Adrenal adenoma, left 03/02/2021   AKI (acute kidney injury) ([place]) 01/04/2021   Intractable nausea and vomiting 01/03/2021   Left-sided weakness 10/19/2020   Cough 02/11/2020   Near syncope 12/10/2019   Brain aneurysm 04/15/2019   Dysphagia 02/27/2018   Encounter for screening colonoscopy 02/27/2018   History of Clostridium difficile infection  02/27/2018   Chronic diarrhea 12/20/2017   Chronic combined systolic and diastolic congestive heart failure ([place]) 06/20/2016   Hypokalemia due to excessive gastrointestinal loss of potassium    Acute CHF (congestive heart failure) ([place]) 05/16/2016   Acute on chronic systolic CHF (congestive heart failure) ([place]) 05/16/2016   Acute respiratory failure with hypoxia (HCC)    Thrombocytosis 03/26/2016   Cardiomyopathy, ischemic 03/25/2016   Chronic combined systolic and diastolic heart failure ([place]) 03/25/2016   Anxiety state 03/25/2016   Troponin level elevated 03/25/2016   Coronary artery disease involving native coronary artery of native heart 03/25/2016   Normocytic anemia 03/25/2016   SOB (shortness of breath) 03/24/2016   Lightheadedness 03/17/2016   Hypotension 03/17/2016   Tobacco abuse 03/12/2016   NSTEMI (non-ST elevated myocardial infarction) ([place]) 03/11/2016   Atypical chest pain    Essential hypertension 09/06/2015   Mixed hyperlipidemia 09/06/2015   GERD without esophagitis 09/06/2015   Chest pain 09/06/2015    Medication List:  Allergies as of 04/23/2021       Reactions   Other Shortness Of Breath, Diarrhea, Nausea And Vomiting, Nausea Only   All- red meats   Tape Other (See Comments)   PEELS SKIN OFF  (PAPER TAPE IS FINE)        Medication List        Accurate as of April 23, 2021  1:24 PM. If you have any questions, ask your nurse or doctor.          ALPRAZolam 1 MG tablet Commonly known as: XANAX Take 1 mg by mouth See admin instructions. Take 1 mg by mouth at bedtime and an additional 1 mg daily as needed for anxiety   aspirin EC 81 MG tablet Take 81 mg by mouth every evening.   atorvastatin 80 MG tablet Commonly known as: LIPITOR Take 1 tablet (80 mg total) by mouth daily at 6 PM.   carvedilol 3.125 MG tablet Commonly known as: COREG Take 1 tablet (3.125 mg total) by mouth 2 (two) times daily.   clopidogrel 75 MG tablet Commonly known  as: PLAVIX Take 1 tablet (75 mg total) by mouth daily.   cyproheptadine 4 MG tablet Commonly known as: PERIACTIN Take 1 tablet (4 mg total) by mouth 2 (two) times daily. Started by: Fara Olden  Jasper Riling, MD   dapagliflozin propanediol 5 MG Tabs tablet Commonly known as: Farxiga Take 1 tablet (5 mg total) by mouth daily before breakfast.   EPINEPHrine 0.3 mg/0.3 mL Soaj injection Commonly known as: EpiPen 2-Pak Inject 0.3 mg into the muscle as needed for anaphylaxis. Started by: Valentina Shaggy, MD   ezetimibe 10 MG tablet Commonly known as: ZETIA Take 1 tablet by mouth once daily   furosemide 20 MG tablet Commonly known as: LASIX Take 1 tablet (20 mg total) by mouth daily.   gabapentin 100 MG capsule Commonly known as: NEURONTIN Take 200 mg by mouth at bedtime.   loperamide 2 MG capsule Commonly known as: IMODIUM Take 2 mg by mouth 4 (four) times daily as needed for diarrhea or loose stools (ibs).   LORazepam 1 MG tablet Commonly known as: ATIVAN Take 1 mg by mouth daily as needed.   losartan 25 MG tablet Commonly known as: COZAAR Take 1 tablet (25 mg total) by mouth daily.   multivitamin with minerals tablet Take 1 tablet by mouth daily.   nicotine 21 mg/24hr patch Commonly known as: NICODERM CQ - dosed in mg/24 hours Place 21 mg onto the skin daily.   nitroGLYCERIN 0.4 MG SL tablet Commonly known as: NITROSTAT Place 1 tablet (0.4 mg total) under the tongue every 5 (five) minutes x 3 doses as needed for chest pain (if no relief after 2nd dose, proceed to the ED for an evaluation or call 911).   omeprazole 20 MG capsule Commonly known as: PRILOSEC Take 1 capsule (20 mg total) by mouth daily.   ondansetron 4 MG tablet Commonly known as: ZOFRAN Take 4 mg by mouth 3 (three) times daily as needed.   potassium chloride SA 20 MEQ tablet Commonly known as: KLOR-CON M Take 2 tablets (40 mEq total) by mouth once for 1 dose.   rOPINIRole 0.5 MG  tablet Commonly known as: REQUIP Take 0.5 mg by mouth at bedtime.   spironolactone 25 MG tablet Commonly known as: ALDACTONE Take 12.5 mg by mouth at bedtime.   VITAMIN D-3 PO Take 1 capsule by mouth daily with breakfast.   zinc gluconate 50 MG tablet Take 50 mg by mouth daily.        Birth History: non-contributory  Developmental History: non-contributory  Past Surgical History: Past Surgical History:  Procedure Laterality Date   APPENDECTOMY     BIOPSY  09/20/2018   Procedure: BIOPSY;  Surgeon: Daneil Dolin, MD;  [facility];  Service: Endoscopy;;  colon   BIOPSY  01/05/2021   Procedure: BIOPSY;  Surgeon: Milus Banister, MD;  Location: [name] ENDOSCOPY;  Service: Endoscopy;;   CARDIAC CATHETERIZATION N/A 03/11/2016   Procedure: Left Heart Cath and Coronary Angiography;  Surgeon: Leonie Man, MD;  Location: [place] [facility];  Service: Cardiovascular;  Laterality: N/A;   CARDIAC CATHETERIZATION N/A 03/11/2016   Procedure: Coronary Stent Intervention;  Surgeon: Leonie Man, MD;  Location: [place] [facility];  Service: Cardiovascular;  Laterality: N/A;   CARDIAC CATHETERIZATION N/A 03/14/2016   Procedure: Coronary Stent Intervention;  Surgeon: Peter M Martinique, MD;  Location: [place] [facility];  Service: Cardiovascular;  Laterality: N/A;   CHOLECYSTECTOMY OPEN  1984   COLONOSCOPY WITH PROPOFOL N/A 09/20/2018   Procedure: COLONOSCOPY WITH PROPOFOL;  Surgeon: Daneil Dolin, MD;  [facility];  Service: Endoscopy;  Laterality: N/A;  10:30am   CORONARY ANGIOPLASTY WITH STENT PLACEMENT  03/14/2016  ESOPHAGOGASTRODUODENOSCOPY (EGD) WITH PROPOFOL N/A 09/20/2018   Procedure: ESOPHAGOGASTRODUODENOSCOPY (EGD) WITH PROPOFOL;  Surgeon: Daneil Dolin, MD;  [facility];  Service: Endoscopy;  Laterality: N/A;   ESOPHAGOGASTRODUODENOSCOPY (EGD) WITH PROPOFOL N/A 01/05/2021   Procedure: ESOPHAGOGASTRODUODENOSCOPY (EGD) WITH PROPOFOL;  Surgeon: Milus Banister, MD;  Location: [facility] ENDOSCOPY;  Service: Endoscopy;  Laterality: N/A;   FINGER ARTHROPLASTY Left 05/14/2013   Procedure: LEFT THUMB CARPAL METACARPAL ARTHROPLASTY;  Surgeon: Tennis Must, MD;  Location: [place];  Service: Orthopedics;  Laterality: Left;   ICD IMPLANT N/A 04/03/2020   Procedure: ICD IMPLANT;  Surgeon: Constance Haw, MD;  Location: [place] [facility];  Service: Cardiovascular;  Laterality: N/A;   IR ANGIO INTRA EXTRACRAN SEL COM CAROTID INNOMINATE BILAT MOD SED  01/05/2017   IR ANGIO INTRA EXTRACRAN SEL COM CAROTID INNOMINATE BILAT MOD SED  03/19/2019   IR ANGIO INTRA EXTRACRAN SEL COM CAROTID INNOMINATE BILAT MOD SED  06/04/2020   IR ANGIO INTRA EXTRACRAN SEL INTERNAL CAROTID UNI L MOD SED  04/15/2019   IR ANGIO VERTEBRAL SEL VERTEBRAL BILAT MOD SED  01/05/2017   IR ANGIO VERTEBRAL SEL VERTEBRAL BILAT MOD SED  03/19/2019   IR ANGIO VERTEBRAL SEL VERTEBRAL UNI L MOD SED  06/04/2020   IR ANGIOGRAM FOLLOW UP STUDY  04/15/2019   IR RADIOLOGIST EVAL & MGMT  12/30/2016   IR TRANSCATH/EMBOLIZ  04/15/2019   IR US GUIDE VASC ACCESS RIGHT  03/19/2019   IR US GUIDE VASC ACCESS RIGHT  06/04/2020   MALONEY DILATION N/A 09/20/2018   Procedure: Venia Minks DILATION;  Surgeon: Daneil Dolin, MD;  [facility];  Service: Endoscopy;  Laterality: N/A;   RADIOLOGY WITH ANESTHESIA N/A 04/15/2019   Procedure: [employer];  Surgeon: Luanne Bras, MD;  Location: [place];  Service: Radiology;  Laterality: N/A;   RIGHT/LEFT HEART CATH AND CORONARY ANGIOGRAPHY N/A 08/19/2019   Procedure: RIGHT/LEFT HEART CATH AND CORONARY ANGIOGRAPHY;  Surgeon: Larey Dresser, MD;  Location: [place] [facility];  Service: Cardiovascular;  Laterality: N/A;   TUBAL LIGATION  1987   VAGINAL HYSTERECTOMY  2009     Family History: Family History  Problem Relation Age of Onset   Stroke Mother    Hypertension Mother    Diabetes Mother    Heart attack Mother    Heart attack Father     Diabetes Father    Hypertension Father    CAD Father    Colon polyps Father 4       pre-cancerous    Stroke Father    Dementia Father    Hyperlipidemia Father    Breast cancer Maternal Grandmother    Cancer Maternal Grandfather        Tongue and esophageal   Heart failure Other    Colon cancer Neg Hx      Social History: [place] lives at home with her family.  She lives in a house that was built in 1977.  There is hardwood throughout the home.  She has wood heating and window units for cooling.  There is a dog inside and outside of the home.  She does have dust mite covers on her bed but not her pillows.  There is tobacco exposure in the home.  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  She is not exposed to fumes, chemicals, or dust.  She does not use a HEPA filter.  She smokes 2 cigarettes/day.   Review  of Systems  Constitutional: Negative.  Negative for fever, malaise/fatigue and weight loss.  HENT: Negative.  Negative for congestion, ear discharge, ear pain and sore throat.   Eyes:  Negative for pain, discharge and redness.  Respiratory:  Negative for cough, sputum production, shortness of breath and wheezing.   Cardiovascular: Negative.  Negative for chest pain and palpitations.  Gastrointestinal:  Positive for nausea and vomiting. Negative for abdominal pain, constipation, diarrhea and heartburn.  Skin: Negative.  Negative for itching and rash.  Neurological:  Negative for dizziness and headaches.  Endo/Heme/Allergies:  Negative for environmental allergies. Does not bruise/bleed easily.      Objective:   Blood pressure 98/62, pulse 88, temperature 97.7 F (36.5 C), temperature source Temporal, resp. rate 16, height 5' 1.61" (1.565 m), weight 109 lb 9.6 oz (49.7 kg), SpO2 98 %. Body mass index is 20.3 kg/m.   Physical Exam:   Physical Exam Vitals reviewed.  Constitutional:      Appearance: She is well-developed.  HENT:     Head: Normocephalic and  atraumatic.     Right Ear: Tympanic membrane, ear canal and external ear normal. No drainage, swelling or tenderness. Tympanic membrane is not injected, scarred, erythematous, retracted or bulging.     Left Ear: Tympanic membrane, ear canal and external ear normal. No drainage, swelling or tenderness. Tympanic membrane is not injected, scarred, erythematous, retracted or bulging.     Nose: No nasal deformity, septal deviation, mucosal edema or rhinorrhea.     Right Turbinates: Enlarged, swollen and pale.     Left Turbinates: Enlarged, swollen and pale.     Right Sinus: No maxillary sinus tenderness or frontal sinus tenderness.     Left Sinus: No maxillary sinus tenderness or frontal sinus tenderness.     Mouth/Throat:     Mouth: Mucous membranes are not pale and not dry.     Pharynx: Uvula midline.  Eyes:     General:        Right eye: No discharge.        Left eye: No discharge.     Conjunctiva/sclera: Conjunctivae normal.     Right eye: Right conjunctiva is not injected. No chemosis.    Left eye: Left conjunctiva is not injected. No chemosis.    Pupils: Pupils are equal, round, and reactive to light.  Cardiovascular:     Rate and Rhythm: Normal rate and regular rhythm.     Heart sounds: Normal heart sounds.  Pulmonary:     Effort: Pulmonary effort is normal. No tachypnea, accessory muscle usage or respiratory distress.     Breath sounds: Normal breath sounds. No wheezing, rhonchi or rales.     Comments: Moving air well in all lung fields. No increased work of breathing noted. Chest:     Chest wall: No tenderness.  Abdominal:     Tenderness: There is no abdominal tenderness. There is no guarding or rebound.  Lymphadenopathy:     Head:     Right side of head: No submandibular, tonsillar or occipital adenopathy.     Left side of head: No submandibular, tonsillar or occipital adenopathy.     Cervical: No cervical adenopathy.  Skin:    Coloration: Skin is not pale.     Findings: No  abrasion, erythema, petechiae or rash. Rash is not papular, urticarial or vesicular.  Neurological:     Mental Status: She is alert.  Psychiatric:        Behavior: Behavior is cooperative.  Diagnostic studies:   Allergy Studies:     Food Adult Perc - 04/23/21 0900     Time Antigen Placed 6283    Allergen Manufacturer Lavella Hammock    Location Back    Number of allergen test 74     Control-buffer 50% Glycerol Negative    Control-Histamine 1 mg/ml 2+    1. Peanut Negative    2. Soybean Negative    3. Wheat Negative    4. Sesame 2+    5. Milk, cow Negative    6. Egg White, Chicken Negative    7. Casein Negative    8. Shellfish Mix Negative    9. Fish Mix Negative    10. Cashew Negative    11. Pecan Food Negative    12. Sugarloaf Negative    13. Almond Negative    14. Hazelnut Negative    15. [place] nut Negative    16. Coconut Negative    17. Pistachio Negative    18. Catfish Negative    19. Bass Negative    20. Trout Negative    21. Tuna Negative    22. Salmon Negative    23. Flounder Negative    24. Codfish Negative    25. Shrimp Negative    26. Crab Negative    27. Lobster Negative    28. Oyster Negative    29. Scallops Negative    30. Barley Negative    31. Oat  Negative    32. Rye  Negative    33. Hops Negative    34. Rice Negative    35. Cottonseed Negative    36. Saccharomyces Cerevisiae  Negative    37. Pork Negative    38. [place] Meat Negative    39. Chicken Meat Negative    40. Beef --   +/-   41. Lamb 2+    42. Tomato Negative    43. White Potato Negative    44. Sweet Potato Negative    45. Pea, Green/English Negative    46. Navy Bean Negative    47. Mushrooms Negative    48. Avocado Negative    49. Onion Negative    50. Cabbage Negative    51. Carrots Negative    52. Celery Negative    53. Corn Negative    54. Cucumber Negative    55. Grape (White seedless) Negative    56. Orange  Negative    57. Banana Negative    58. Apple Negative     59. Peach Negative    60. Strawberry Negative    61. Cantaloupe Negative    62. Watermelon Negative    63. Pineapple Negative    64. Chocolate/Cacao bean Negative    65. Karaya Gum Negative    66. Acacia (Arabic Gum) Negative    67. Cinnamon Negative    68. Nutmeg Negative    69. Ginger Negative    70. Garlic Negative    71. Pepper, black Negative    72. Mustard Negative             Allergy testing results were read and interpreted by myself, documented by clinical staff.         Salvatore Marvel, MD [facility] [place]

## 2021-04-23 NOTE — Patient Instructions (Addendum)
1. Nausea - I am confused as to why you are having this intense nausea. - I am not convinced that this is all related to your alpha gal allergy. - I am going to do a few labs, but I think that you need to see GI. - We will put in a referral and call [facility] to make your appointment happen.  - I am going to start Periactin 4mg  twice daily (this can act as an appetite stimulant).   2. Anaphylactic shock due to food - Testing with positive to lamb and beef (which we knew) and sesame.  - I am not sure that the sesame is relevant since you do not eat it at all.  - Everything else was negative. - Copy of testing results provided. - There is no need to actively avoid the foods that were negative today (aside from milk and red meats, of course). - Continue to avoid red meat and dairy.  - I am going to get a milk panel to check on your allergy levels to milk. - Alpha gal information provided.  3. Return in about 4 weeks (around 05/21/2021).    Please inform us of any Emergency Department visits, hospitalizations, or changes in symptoms. Call us before going to the ED for breathing or allergy symptoms since we might be able to fit you in for a sick visit. Feel free to contact us anytime with any questions, problems, or concerns.  It was a pleasure to meet you today!  Websites that have reliable patient information: 1. American Academy of Asthma, Allergy, and Immunology: www.aaaai.org 2. Food Allergy Research and Education (FARE): foodallergy.org 3. Mothers of Asthmatics: http://www.asthmacommunitynetwork.org 4. American College of Allergy, Asthma, and Immunology: www.acaai.org   COVID-19 Vaccine Information can be found at: ShippingScam.co.uk For questions related to vaccine distribution or appointments, please email [email or web address] or call [phone].   We realize that you might be concerned about having an allergic  reaction to the COVID19 vaccines. To help with that concern, WE ARE OFFERING THE COVID19 VACCINES IN OUR OFFICE! Ask the front desk for dates!     Like [place] on [employer] and Instagram for our latest updates!      A healthy democracy works best when [employer] participate! Make sure you are registered to vote! If you have moved or changed any of your contact information, you will need to get this updated before voting!  In some cases, you MAY be able to register to vote online: [email or web address]      Food Adult Perc - 04/23/21 0900     Time Antigen Placed 5400    Allergen Manufacturer Lavella Hammock    Location Back    Number of allergen test 74     Control-buffer 50% Glycerol Negative    Control-Histamine 1 mg/ml 2+    1. Peanut Negative    2. Soybean Negative    3. Wheat Negative    4. Sesame 2+    5. Milk, cow Negative    6. Egg White, Chicken Negative    7. Casein Negative    8. Shellfish Mix Negative    9. Fish Mix Negative    10. Cashew Negative    11. Pecan Food Negative    12. [place] Negative    13. Almond Negative    14. Hazelnut Negative    15. [place] nut Negative    16. Coconut Negative    17. Pistachio Negative    18. Catfish Negative  19. Bass Negative    20. Trout Negative    21. Tuna Negative    22. Salmon Negative    23. Flounder Negative    24. Codfish Negative    25. Shrimp Negative    26. Crab Negative    27. Lobster Negative    28. Oyster Negative    29. Scallops Negative    30. Barley Negative    31. Oat  Negative    32. Rye  Negative    33. Hops Negative    34. Rice Negative    35. Cottonseed Negative    36. Saccharomyces Cerevisiae  Negative    37. Pork Negative    38. [place] Meat Negative    39. Chicken Meat Negative    40. Beef --   +/-   41. Lamb 2+    42. Tomato Negative    43. White Potato Negative    44. Sweet Potato Negative    45. Pea, Green/English Negative    46. Navy Bean Negative    47.  Mushrooms Negative    48. Avocado Negative    49. Onion Negative    50. Cabbage Negative    51. Carrots Negative    52. Celery Negative    53. Corn Negative    54. Cucumber Negative    55. Grape (White seedless) Negative    56. Orange  Negative    57. Banana Negative    58. Apple Negative    59. Peach Negative    60. Strawberry Negative    61. Cantaloupe Negative    62. Watermelon Negative    63. Pineapple Negative    64. Chocolate/Cacao bean Negative    65. Karaya Gum Negative    66. Acacia (Arabic Gum) Negative    67. Cinnamon Negative    68. Nutmeg Negative    69. Ginger Negative    70. Garlic Negative    71. Pepper, black Negative    72. Mustard Negative

## 2021-04-26 LAB — MILK COMPONENT PANEL
F076-IgE Alpha Lactalbumin: <0.1 kU/L
F077-IgE Beta Lactoglobulin: <0.1 kU/L
F078-IgE Casein: <0.1 kU/L

## 2021-04-26 LAB — TRYPTASE: Tryptase: 5.2 ug/L (ref 2.2–13.2)

## 2021-04-27 ENCOUNTER — Ambulatory Visit: Payer: 59 | Admitting: Internal Medicine

## 2021-04-27 NOTE — Progress Notes (Signed)
Patient has been seen with Marylee Floras a few times in the past. She was also scheduled with them on 03/16/2021 but showed up late to her visit so they had to reschedule the patient. The patient cancelled the appointment that was made for her to be seen with them this month. I called the patient and told her to reach back out to their office to get scheduled again. It also looks like her PCP has tried referring to [facility] recently and they denied the referral also stating she is a patient of [facility]. The patients states she will give them a call and has their phone number.

## 2021-04-30 ENCOUNTER — Ambulatory Visit (HOSPITAL_COMMUNITY)
Admission: RE | Admit: 2021-04-30 | Discharge: 2021-04-30 | Disposition: A | Discharge status: Home / Self Care | Payer: 59 | Source: Ambulatory Visit | Attending: Gastroenterology | Admitting: Gastroenterology

## 2021-04-30 ENCOUNTER — Other Ambulatory Visit: Payer: Self-pay

## 2021-04-30 DIAGNOSIS — K769 Liver disease, unspecified: Secondary | ICD-10-CM | POA: Diagnosis not present

## 2021-04-30 MED ORDER — GADOBUTROL 1 MMOL/ML IV SOLN
5.0000 mL | Freq: Once | INTRAVENOUS | Status: AC | PRN
  Administered 2021-04-30: 14:00:00 5 mL via INTRAVENOUS

## 2021-04-30 NOTE — Progress Notes (Signed)
Per order changed device to OVO for MRI scan.   Will change device back to previous settings and sent post transmission.  Nothing further needed at this time.

## 2021-05-07 ENCOUNTER — Encounter: Payer: Self-pay | Admitting: Internal Medicine

## 2021-05-07 ENCOUNTER — Other Ambulatory Visit: Payer: Self-pay

## 2021-05-07 ENCOUNTER — Telehealth: Payer: Self-pay

## 2021-05-07 ENCOUNTER — Ambulatory Visit (INDEPENDENT_AMBULATORY_CARE_PROVIDER_SITE_OTHER): Payer: Commercial Managed Care - PPO | Admitting: Internal Medicine

## 2021-05-07 VITALS — BP 128/84 | HR 84 | Temp 97.3°F | Ht 62.0 in | Wt 113.8 lb

## 2021-05-07 DIAGNOSIS — R112 Nausea with vomiting, unspecified: Secondary | ICD-10-CM

## 2021-05-07 DIAGNOSIS — K769 Liver disease, unspecified: Secondary | ICD-10-CM | POA: Diagnosis not present

## 2021-05-07 DIAGNOSIS — K219 Gastro-esophageal reflux disease without esophagitis: Secondary | ICD-10-CM | POA: Diagnosis not present

## 2021-05-07 DIAGNOSIS — R634 Abnormal weight loss: Secondary | ICD-10-CM

## 2021-05-07 DIAGNOSIS — Z91018 Allergy to other foods: Secondary | ICD-10-CM

## 2021-05-07 DIAGNOSIS — R110 Nausea: Secondary | ICD-10-CM

## 2021-05-07 MED ORDER — PANTOPRAZOLE SODIUM 40 MG PO TBEC: 40.0000 mg | DELAYED_RELEASE_TABLET | Freq: Every day | ORAL | 11 refills | Status: DC

## 2021-05-07 NOTE — Patient Instructions (Signed)
It was good to see you again today!  Begin Protonix or pantoprazole 40 mg pill daily before breakfast (dispense 30 with 11 refills  We will order a solid-phase gastric emptying study to assess for gastroparesis-(nausea and vomiting-alpha gal allergy is the indication)  We need to check a stool sample for H. pylori antigen. We will do this at some point the first of the year.  You will actually have to come off of the Protonix for 2 weeks before having this test done  Once I get the gastric emptying study results, we will be in touch with you.  Office visit here in 6 weeks

## 2021-05-07 NOTE — Progress Notes (Signed)
Primary Care Physician:  [name], MD Primary Gastroenterologist:  Dr. Gala Romney  Pre-Procedure History & Physical: HPI:  Erin [name] is a 60 y.o. female here for further evaluation of recurrent nausea and vomiting since July of this year.  Nausea and vomiting started temporally related to having multiple ticks pulled off her with associated rash.  She has been extensively evaluated.  She has been hospitalized multiple times.  She has lost 30 pounds over the past 6 weeks.  Diagnostic evaluation has included EGD -  H. pylori gastritis -treated.  Celiac screen negative.  Low fasting cortisol but normal ACTH stimulation test. No abdominal pain.  No melena, hematochezia constipation or diarrhea.  Has had worsening reflux symptoms off omeprazole (was told to stop because of capsule ingredients).  She is seeing Dr. Ernst Bowler.  Serum tryptase within normal limits.  She she has not had a rash since tick exposure.  She does not smoke marijuana or consume alcohol.  No opioids. She is not having a headache or any neurological changes.  MRI of the hepatobiliary tree recently demonstrated simple hepatic and pancreatic cysts and a couple lesions consistent with hemangioma; no further evaluation warranted.  She is not diabetic.  Prior to the last 6 months she is never had any similar illness in the past.  Past Medical History:  Diagnosis Date   Anxiety state 03/25/2016   Basal cell carcinoma of forehead    Brain aneurysm    CHF (congestive heart failure) ([place])    Coronary artery disease    a. 03/11/16 PCI with DES-->Prox/Mid Cx;  b. 03/14/16 PCI with DES x2-->RCA, EF 30-35%.   Essential hypertension    GERD (gastroesophageal reflux disease)    HFrEF (heart failure with reduced ejection fraction) ([place])    a. 10/2016 Echo: EF 35-40%, Gr1 DD, mild focal basal septal hypertrophy, basal inflat, mid inflat, basal antlat AK. Mid infept/inf/antlat, apical lateral sev HK. Mod MR. mildly reduced RV fxn. Mild  TR.   History of pneumonia    Hyperlipidemia    IBS (irritable bowel syndrome)    Ischemic cardiomyopathy    a. 10/2016 Echo: EF 35-40%, Gr1 DD.   Mitral regurgitation    NSTEMI (non-ST elevated myocardial infarction) ([place]) 03/10/2016   Pneumonia 03/2016   Squamous cell cancer of skin of nose    Thrombocytosis 03/26/2016   Tobacco abuse    Trichimoniasis    Wears dentures    Wears glasses     Past Surgical History:  Procedure Laterality Date   APPENDECTOMY     BIOPSY  09/20/2018   Procedure: BIOPSY;  Surgeon: Daneil Dolin, MD;  [facility];  Service: Endoscopy;;  colon   BIOPSY  01/05/2021   Procedure: BIOPSY;  Surgeon: Milus Banister, MD;  Location: [facility] ENDOSCOPY;  Service: Endoscopy;;   CARDIAC CATHETERIZATION N/A 03/11/2016   Procedure: Left Heart Cath and Coronary Angiography;  Surgeon: Leonie Man, MD;  Location: [place] CV LAB;  Service: Cardiovascular;  Laterality: N/A;   CARDIAC CATHETERIZATION N/A 03/11/2016   Procedure: Coronary Stent Intervention;  Surgeon: Leonie Man, MD;  Location: [place] [facility];  Service: Cardiovascular;  Laterality: N/A;   CARDIAC CATHETERIZATION N/A 03/14/2016   Procedure: Coronary Stent Intervention;  Surgeon: Peter M Martinique, MD;  Location: [place] [facility];  Service: Cardiovascular;  Laterality: N/A;   CHOLECYSTECTOMY OPEN  1984   COLONOSCOPY WITH PROPOFOL N/A 09/20/2018   Procedure: COLONOSCOPY WITH PROPOFOL;  Surgeon: Gala Romney,  Cristopher Estimable, MD;  [facility];  Service: Endoscopy;  Laterality: N/A;  10:30am   CORONARY ANGIOPLASTY WITH STENT PLACEMENT  03/14/2016   ESOPHAGOGASTRODUODENOSCOPY (EGD) WITH PROPOFOL N/A 09/20/2018   Procedure: ESOPHAGOGASTRODUODENOSCOPY (EGD) WITH PROPOFOL;  Surgeon: Daneil Dolin, MD;  [facility];  Service: Endoscopy;  Laterality: N/A;   ESOPHAGOGASTRODUODENOSCOPY (EGD) WITH PROPOFOL N/A 01/05/2021   Procedure: ESOPHAGOGASTRODUODENOSCOPY (EGD) WITH PROPOFOL;  Surgeon:  Milus Banister, MD;  Location: [facility] ENDOSCOPY;  Service: Endoscopy;  Laterality: N/A;   FINGER ARTHROPLASTY Left 05/14/2013   Procedure: LEFT THUMB CARPAL METACARPAL ARTHROPLASTY;  Surgeon: Tennis Must, MD;  Location: [place];  Service: Orthopedics;  Laterality: Left;   ICD IMPLANT N/A 04/03/2020   Procedure: ICD IMPLANT;  Surgeon: Constance Haw, MD;  Location: [place] [facility];  Service: Cardiovascular;  Laterality: N/A;   IR ANGIO INTRA EXTRACRAN SEL COM CAROTID INNOMINATE BILAT MOD SED  01/05/2017   IR ANGIO INTRA EXTRACRAN SEL COM CAROTID INNOMINATE BILAT MOD SED  03/19/2019   IR ANGIO INTRA EXTRACRAN SEL COM CAROTID INNOMINATE BILAT MOD SED  06/04/2020   IR ANGIO INTRA EXTRACRAN SEL INTERNAL CAROTID UNI L MOD SED  04/15/2019   IR ANGIO VERTEBRAL SEL VERTEBRAL BILAT MOD SED  01/05/2017   IR ANGIO VERTEBRAL SEL VERTEBRAL BILAT MOD SED  03/19/2019   IR ANGIO VERTEBRAL SEL VERTEBRAL UNI L MOD SED  06/04/2020   IR ANGIOGRAM FOLLOW UP STUDY  04/15/2019   IR RADIOLOGIST EVAL & MGMT  12/30/2016   IR TRANSCATH/EMBOLIZ  04/15/2019   IR US GUIDE VASC ACCESS RIGHT  03/19/2019   IR US GUIDE VASC ACCESS RIGHT  06/04/2020   MALONEY DILATION N/A 09/20/2018   Procedure: Venia Minks DILATION;  Surgeon: Daneil Dolin, MD;  [facility];  Service: Endoscopy;  Laterality: N/A;   RADIOLOGY WITH ANESTHESIA N/A 04/15/2019   Procedure: [employer];  Surgeon: Luanne Bras, MD;  Location: [facility];  Service: Radiology;  Laterality: N/A;   RIGHT/LEFT HEART CATH AND CORONARY ANGIOGRAPHY N/A 08/19/2019   Procedure: RIGHT/LEFT HEART CATH AND CORONARY ANGIOGRAPHY;  Surgeon: Larey Dresser, MD;  Location: [place] [facility];  Service: Cardiovascular;  Laterality: N/A;   TUBAL LIGATION  1987   VAGINAL HYSTERECTOMY  2009    Prior to Admission medications   Medication Sig Start Date End Date Taking? Authorizing Provider  ALPRAZolam Duanne Moron) 1 MG tablet Take 1 mg by mouth See admin  instructions. Take 1 mg by mouth at bedtime and an additional 1 mg daily as needed for anxiety   Yes [provider]  aspirin EC 81 MG tablet Take 81 mg by mouth every evening.    Yes [provider]  atorvastatin (LIPITOR) 80 MG tablet Take 1 tablet (80 mg total) by mouth daily at 6 PM. 11/10/20  Yes Larey Dresser, MD  carvedilol (COREG) 3.125 MG tablet Take 1 tablet (3.125 mg total) by mouth 2 (two) times daily. 02/08/21 05/09/21 Yes Clegg, Amy D, NP  cyproheptadine (PERIACTIN) 4 MG tablet Take 1 tablet (4 mg total) by mouth 2 (two) times daily. 04/23/21 05/23/21 Yes Valentina Shaggy, MD  dapagliflozin propanediol (FARXIGA) 5 MG TABS tablet Take 1 tablet (5 mg total) by mouth daily before breakfast. 03/15/21  Yes Nida, Marella Chimes, MD  EPINEPHrine (EPIPEN 2-PAK) 0.3 mg/0.3 mL IJ SOAJ injection Inject 0.3 mg into the muscle as needed for anaphylaxis. 04/23/21  Yes Valentina Shaggy, MD  ezetimibe (  ZETIA) 10 MG tablet Take 1 tablet by mouth once daily 04/08/21  Yes Larey Dresser, MD  furosemide (LASIX) 20 MG tablet Take 1 tablet (20 mg total) by mouth daily. 04/13/21 07/12/21 Yes Larey Dresser, MD  gabapentin (NEURONTIN) 100 MG capsule Take 200 mg by mouth at bedtime.   Yes [provider]  loperamide (IMODIUM) 2 MG capsule Take 2 mg by mouth 4 (four) times daily as needed for diarrhea or loose stools (ibs).   Yes [provider]  LORazepam (ATIVAN) 1 MG tablet Take 1 mg by mouth daily as needed. 03/10/21  Yes [provider]  losartan (COZAAR) 25 MG tablet Take 1 tablet (25 mg total) by mouth daily. 03/12/21 06/10/21 Yes Larey Dresser, MD  Multiple Vitamins-Minerals (MULTIVITAMIN WITH MINERALS) tablet Take 1 tablet by mouth daily.   Yes [provider]  nicotine (NICODERM CQ - DOSED IN MG/24 HOURS) 21 mg/24hr patch Place 21 mg onto the skin daily.   Yes [provider]  nitroGLYCERIN (NITROSTAT) 0.4 MG SL tablet Place 1 tablet  (0.4 mg total) under the tongue every 5 (five) minutes x 3 doses as needed for chest pain (if no relief after 2nd dose, proceed to the ED for an evaluation or call 911). 09/01/20  Yes Verta Ellen., NP  ondansetron (ZOFRAN) 4 MG tablet Take 4 mg by mouth 3 (three) times daily as needed. 03/04/21  Yes [provider]  promethazine (PHENERGAN) 12.5 MG suppository Place 12.5 mg rectally as needed for nausea or vomiting. Pt unsure of dose   Yes [provider]  rOPINIRole (REQUIP) 0.5 MG tablet Take 0.5 mg by mouth at bedtime.  11/13/17  Yes [provider]  spironolactone (ALDACTONE) 25 MG tablet Take 12.5 mg by mouth at bedtime.   Yes [provider]    Allergies as of 05/07/2021 - Review Complete 05/07/2021  Allergen Reaction Noted   Other Shortness Of Breath, Diarrhea, Nausea And Vomiting, and Nausea Only 03/02/2021   Tape Other (See Comments) 01/03/2017    Family History  Problem Relation Age of Onset   Stroke Mother    Hypertension Mother    Diabetes Mother    Heart attack Mother    Heart attack Father    Diabetes Father    Hypertension Father    CAD Father    Colon polyps Father 58       pre-cancerous    Stroke Father    Dementia Father    Hyperlipidemia Father    Breast cancer Maternal Grandmother    Cancer Maternal Grandfather        Tongue and esophageal   Heart failure Other    Colon cancer Neg Hx     Social History   Socioeconomic History   Marital status: Married    Spouse name: Not on file   Number of children: Not on file   Years of education: Not on file   Highest education level: Not on file  Occupational History   Occupation: CNA  Tobacco Use   Smoking status: Some Days    Packs/day: 0.10    Years: 15.00    Pack years: 1.50    Types: Cigarettes   Smokeless tobacco: Never   Tobacco comments:    smokes a cigarette occasionally  Vaping Use   Vaping Use: Never used  Substance and Sexual Activity   Alcohol use:  Yes    Comment: occasionally   Drug use: Not Currently  Types: Marijuana    Comment: former- 2017 last time   Sexual activity: Not Currently    Birth control/protection: Surgical    Comment: hyst  Other Topics Concern   Not on file  Social History Narrative   Lives with husband in [place] in a one story home with a basement.  Has 4 children.  Works as a Quarry manager.  Education: CNA school.    Social Determinants of Health   Financial Resource Strain: Not on file  Food Insecurity: Not on file  Transportation Needs: Not on file  Physical Activity: Not on file  Stress: Not on file  Social Connections: Not on file  Intimate Partner Violence: Not on file    Review of Systems: See HPI, otherwise negative ROS  Physical Exam: BP 128/84    Pulse 84    Temp (!) 97.3 F (36.3 C) (Temporal)    Ht 5\' 2"  (1.575 m)    Wt 113 lb 12.8 oz (51.6 kg)    BMI 20.81 kg/m  General:   Alert,  Well-developed, well-nourished, pleasant and cooperative in NAD SNeck:  Supple; no masses or thyromegaly. No significant cervical adenopathy. Lungs:  Clear throughout to auscultation.   No wheezes, crackles, or rhonchi. No acute distress. Heart:  Regular rate and rhythm; no murmurs, clicks, rubs,  or gallops. Abdomen: Non-distended, normal bowel sounds.  Soft and nontender without appreciable mass or hepatosplenomegaly.  Pulses:  Normal pulses noted. Extremities:  Without clubbing or edema.  Impression/Plan: 60 year old lady with a 64-month history of nausea, vomiting, anorexia, GERD with associated significant weight loss.  Symptom onset temporally related to tick bite and rash.  Alpha gal positive.  No improvement with the pretty much absolute avoidance of beef products. Fairly extensive evaluation already.  Recent diagnosis of H. pylori likely has nothing to do with her GI symptoms.  Certainly, agree with treatment.  We need to document eradication.  I doubt alpha gal is directly responsible for her nausea and  vomiting although she may now have a postinfectious/postviral induced gastric motility disorder.  Recommendations:  Begin Protonix or pantoprazole 40 mg pill daily before breakfast (dispense 30 with 11 refills  Solid-phase gastric emptying study to assess for gastroparesis-  Check a stool sample for H. pylori antigen.  Patient will need to come off of the Protonix for 2 weeks before having this test done  Further recommendations to follow pending gastric emptying study results.      Notice: This dictation was prepared with Dragon dictation along with smaller phrase technology. Any transcriptional errors that result from this process are unintentional and may not be corrected upon review.

## 2021-05-07 NOTE — Telephone Encounter (Signed)
PA for GES submitted via [employer]. Case ID: [id number].   [employer] is primary insurance. Pt will have new secondary insurance next year. She will bring new card to office 05/11/21.

## 2021-05-12 NOTE — Telephone Encounter (Signed)
No PA required for GES per [facility] website.

## 2021-05-13 ENCOUNTER — Encounter (HOSPITAL_COMMUNITY): Payer: Self-pay | Admitting: Cardiology

## 2021-05-13 ENCOUNTER — Other Ambulatory Visit: Payer: Self-pay

## 2021-05-13 ENCOUNTER — Ambulatory Visit (HOSPITAL_COMMUNITY)
Admission: RE | Admit: 2021-05-13 | Discharge: 2021-05-13 | Disposition: A | Discharge status: Home / Self Care | Payer: Commercial Managed Care - PPO | Source: Ambulatory Visit | Attending: Cardiology | Admitting: Cardiology

## 2021-05-13 ENCOUNTER — Telehealth (HOSPITAL_COMMUNITY): Payer: Self-pay

## 2021-05-13 VITALS — BP 114/80 | HR 73 | Wt 115.2 lb

## 2021-05-13 DIAGNOSIS — Z955 Presence of coronary angioplasty implant and graft: Secondary | ICD-10-CM | POA: Insufficient documentation

## 2021-05-13 DIAGNOSIS — I2510 Atherosclerotic heart disease of native coronary artery without angina pectoris: Secondary | ICD-10-CM | POA: Diagnosis not present

## 2021-05-13 DIAGNOSIS — Z7982 Long term (current) use of aspirin: Secondary | ICD-10-CM | POA: Diagnosis not present

## 2021-05-13 DIAGNOSIS — I5042 Chronic combined systolic (congestive) and diastolic (congestive) heart failure: Secondary | ICD-10-CM | POA: Diagnosis not present

## 2021-05-13 DIAGNOSIS — I255 Ischemic cardiomyopathy: Secondary | ICD-10-CM | POA: Diagnosis not present

## 2021-05-13 DIAGNOSIS — I252 Old myocardial infarction: Secondary | ICD-10-CM | POA: Diagnosis not present

## 2021-05-13 DIAGNOSIS — I5022 Chronic systolic (congestive) heart failure: Secondary | ICD-10-CM | POA: Diagnosis not present

## 2021-05-13 DIAGNOSIS — I739 Peripheral vascular disease, unspecified: Secondary | ICD-10-CM | POA: Diagnosis not present

## 2021-05-13 DIAGNOSIS — Z7902 Long term (current) use of antithrombotics/antiplatelets: Secondary | ICD-10-CM | POA: Diagnosis not present

## 2021-05-13 DIAGNOSIS — F17210 Nicotine dependence, cigarettes, uncomplicated: Secondary | ICD-10-CM | POA: Insufficient documentation

## 2021-05-13 DIAGNOSIS — I6529 Occlusion and stenosis of unspecified carotid artery: Secondary | ICD-10-CM | POA: Diagnosis not present

## 2021-05-13 DIAGNOSIS — Z79899 Other long term (current) drug therapy: Secondary | ICD-10-CM | POA: Insufficient documentation

## 2021-05-13 LAB — LIPID PANEL
Cholesterol: 115 mg/dL (ref 0–200)
HDL: 53 mg/dL (ref >40)
LDL Cholesterol: 43 mg/dL (ref 0–99)
Total CHOL/HDL Ratio: 2.2 RATIO
Triglycerides: 96 mg/dL (ref <150)
VLDL: 19 mg/dL (ref 0–40)

## 2021-05-13 LAB — BASIC METABOLIC PANEL
Anion gap: 5 (ref 5–15)
BUN: 15 mg/dL (ref 6–20)
CO2: 25 mmol/L (ref 22–32)
Calcium: 9.1 mg/dL (ref 8.9–10.3)
Chloride: 108 mmol/L (ref 98–111)
Creatinine, Ser: 0.81 mg/dL (ref 0.44–1.00)
GFR, Estimated: >60 mL/min (ref >60)
Glucose, Bld: 92 mg/dL (ref 70–99)
Potassium: 4.5 mmol/L (ref 3.5–5.1)
Sodium: 138 mmol/L (ref 135–145)

## 2021-05-13 LAB — BRAIN NATRIURETIC PEPTIDE: B Natriuretic Peptide: 96.9 pg/mL (ref 0.0–100.0)

## 2021-05-13 MED ORDER — CARVEDILOL 6.25 MG PO TABS: 6.2500 mg | ORAL_TABLET | Freq: Two times a day (BID) | ORAL | 1 refills | Status: DC

## 2021-05-13 MED ORDER — CLOPIDOGREL BISULFATE 75 MG PO TABS: 75.0000 mg | ORAL_TABLET | Freq: Every day | ORAL | 3 refills | Status: DC

## 2021-05-13 NOTE — Telephone Encounter (Signed)
Spoke with patient, advised to take lasix 40mg  for 3 days then resume 20-mg daily after that. Pt verbalized understanding.  Pt asking if she should continue plavix.  She did not have any more refills and wondered if she should continue it. D/w Dr Aundra Dubin, pt should continue until she is told to stop by Dr Estanislado Pandy.  Refilled per Dr Aundra Dubin.  Per Dr Aundra Dubin she is to call his office to see if she needs to continue.  If pt is told to stop, per Dr Aundra Dubin she should call our office as he would like to start her on xarelto 2.5mg  bid (also see Dr Aundra Dubin note 11/22). Pt verbalized understanding.

## 2021-05-13 NOTE — Patient Instructions (Signed)
INCREASE Coreg to 6.25mg  (1 tab) twice a day  Labs today We will only contact you if something comes back abnormal or we need to make some changes. Otherwise no news is good news!  Your physician has requested that you have a carotid duplex. This test is an ultrasound of the carotid arteries in your neck. It looks at blood flow through these arteries that supply the brain with blood. Allow one hour for this exam. There are no restrictions or special instructions.  You will get a call to schedule this test  Your physician recommends that you schedule a follow-up appointment in: 3 weeks with the pharmacist x 2 visits and 3 months with the Nurse Practitioner/Physician Assistant  Please call office at [phone] option 2 if you have any questions or concerns.   Do the following things EVERYDAY: Weigh yourself in the morning before breakfast. Write it down and keep it in a log. Take your medicines as prescribed Eat low salt foods--Limit salt (sodium) to 2000 mg per day.  Stay as active as you can everyday Limit all fluids for the day to less than 2 liters

## 2021-05-13 NOTE — Progress Notes (Signed)
PCP: [place] Gravel, MD HF Cardiology: [name]  61 y.o. with history of CAD, ischemic cardiomyopathy, and PAD was referred by [name] for evaluation of CHF.  She had NSTEMI in 11/17 with PCI to prox/mid LCx and RCA.  Subsequently, has developed ischemic cardiomyopathy.  Most recent echo in 1/21 showed EF 30-35%.  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   RHC/LHC done with exertional chest heaviness and dyspnea in 4/21 showed normal filling pressures, preserved cardiac output, and nonobstructive mild CAD.  ABIs were normal in 4/21.   In 8/21, she had syncope thought to be related to orthostasis from low BP in the setting of cardiac medication titration.  Entresto was decreased.   She was in the ER in 11/21 with chest pain.  Troponin and COVID-19 negative. Echo in 11/21 showed EF 30-35%, normal RV.   She was hospitalized in 6/22 with left-sided weakness/numbness.  No evidence for acute CVA, ?TIA.    Echo in 11/22 showed EF 30-35% with mild LV dilation, normal RV, mild-moderate MR, IVC normal.   She returns for followup of CHF.  Still smokes about a pack every 2 months.  Weight is stable.  No significant exertional dyspnea or chest pain.  No orthopnea/PND.  She is still working at [employer] issue recently has been 6 months of nausea/vomiting/anorexia.  This has finally improved.  It is not clear what caused it, she has been seeing a GI MD, there is some concern for alpha-gal allergy versus gastroparesis.   Labs (2/21): K 3.7, creatinine 0.82 Labs (4/21): K 4.3, creatinine 0.81, LDL 67, TGs 286 Labs (6/21): K 4.7, creatinine 1.22 Labs (11/21): K 3.7, creatinine 0.84, hgb 12.6, LDL 67, HDL 48, TGs 286 Labs (1/22). K 4.4, creatinine 1.03 Labs (6/22): K 3.5, creatinine 1.0  Medtronic device interrogation: thoracic impedance trending down with fluid index just > threshold.  No AF/VT.   PMH: 1. CAD: NSTEMI with DES to proximal and mid LCx  in 11/17, staged DES x 2 to RCA later in 11/17.  - Cardiolite (10/20): EF < 30%, large inferior and inferolateral MI with mild peri-infarct ischemia.  - LHC (4/21): Patent stents, nonobstructive CAD.  2. Chronic systolic CHF: Ischemic cardiomyopathy. Medtronic ICD.  - Echo (10/20): EF 35-40%, lateral WMAs, normal RV. - Echo (1/21): EF 30-35%, mild LVH, normal RV - RHC (4/21): mean RA 5, PA 29/3, mean PCWP 12, CI 3.04 - Echo (11/21): EF 30-35%, normal RV.  - Echo (11/22): EF 30-35% with mild LV dilation, normal RV, mild-moderate MR, IVC normal.  3. 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  4. Carotid stenosis: Carotid dopplers (82/80) with 03-49% LICA stenosis.  - Carotid dopplers (6/21): 40-59% BICA stenosis.  - Carotid dopplers (1/22): 17-91% RICA, 50-56% LICA.  5. Active smoker.  6. PAD: ABIs (4/21) Normal.  - Peripheral artery dopplers (12/21): bilateral plaque without focal stenosis.   Social History   Socioeconomic History   Marital status: Married    Spouse name: Not on file   Number of children: Not on file   Years of education: Not on file   Highest education level: Not on file  Occupational History   Occupation: CNA  Tobacco Use   Smoking status: Some Days    Packs/day: 0.10    Years: 15.00    Pack years: 1.50    Types: Cigarettes   Smokeless tobacco: Never   Tobacco comments:    smokes  a cigarette occasionally  Vaping Use   Vaping Use: Never used  Substance and Sexual Activity   Alcohol use: Yes    Comment: occasionally   Drug use: Not Currently    Types: Marijuana    Comment: former- 2017 last time   Sexual activity: Not Currently    Birth control/protection: Surgical    Comment: hyst  Other Topics Concern   Not on file  Social History Narrative   Lives with husband in [name] in a one story home with a basement.  Has 4 children.  Works as a [employer]ar[employer] manager.  Education: CNA school.    Social Determinants of Health   Financial Resource  Strain: Not on file  Food Insecurity: Not on file  Transportation Needs: Not on file  Physical Activity: Not on file  Stress: Not on file  Social Connections: Not on file  Intimate Partner Violence: Not on file   Family History  Problem Relation Age of Onset   Stroke Mother    Hypertension Mother    Diabetes Mother    Heart attack Mother    Heart attack Father    Diabetes Father    Hypertension Father    CAD Father    Colon polyps Father 74       pre-cancerous    Stroke Father    Dementia Father    Hyperlipidemia Father    Breast cancer Maternal Grandmother    Cancer Maternal Grandfather        Tongue and esophageal   Heart failure Other    Colon cancer Neg Hx    ROS: All systems reviewed and negative except as per HPI.   Current Outpatient Medications  Medication Sig Dispense Refill   ALPRAZolam (XANAX) 1 MG tablet Take 1 mg by mouth See admin instructions. Take 1 mg by mouth at bedtime and an additional 1 mg daily as needed for anxiety     aspirin EC 81 MG tablet Take 81 mg by mouth every evening.      atorvastatin (LIPITOR) 80 MG tablet Take 1 tablet (80 mg total) by mouth daily at 6 PM. 90 tablet 3   cyproheptadine (PERIACTIN) 4 MG tablet Take 1 tablet (4 mg total) by mouth 2 (two) times daily. 60 tablet 0   dapagliflozin propanediol (FARXIGA) 5 MG TABS tablet Take 1 tablet (5 mg total) by mouth daily before breakfast. 90 tablet 1   EPINEPHrine (EPIPEN 2-PAK) 0.3 mg/0.3 mL IJ SOAJ injection Inject 0.3 mg into the muscle as needed for anaphylaxis. 2 each 2   ezetimibe (ZETIA) 10 MG tablet Take 1 tablet by mouth once daily 30 tablet 12   furosemide (LASIX) 20 MG tablet Take 1 tablet (20 mg total) by mouth daily. 90 tablet 3   gabapentin (NEURONTIN) 100 MG capsule Take 200 mg by mouth at bedtime.     loperamide (IMODIUM) 2 MG capsule Take 2 mg by mouth 4 (four) times daily as needed for diarrhea or loose stools (ibs).     LORazepam (ATIVAN) 1 MG tablet Take 1 mg by mouth  daily as needed.     losartan (COZAAR) 25 MG tablet Take 1 tablet (25 mg total) by mouth daily. 90 tablet 3   Multiple Vitamins-Minerals (MULTIVITAMIN WITH MINERALS) tablet Take 1 tablet by mouth daily.     nicotine (NICODERM CQ - DOSED IN MG/24 HOURS) 21 mg/24hr patch Place 21 mg onto the skin daily.     nitroGLYCERIN (NITROSTAT) 0.4 MG SL tablet Place 1 tablet (  0.4 mg total) under the tongue every 5 (five) minutes x 3 doses as needed for chest pain (if no relief after 2nd dose, proceed to the ED for an evaluation or call 911). 25 tablet 2   ondansetron (ZOFRAN) 4 MG tablet Take 4 mg by mouth 3 (three) times daily as needed.     pantoprazole (PROTONIX) 40 MG tablet Take 1 tablet (40 mg total) by mouth daily. 30 tablet 11   promethazine (PHENERGAN) 12.5 MG suppository Place 12.5 mg rectally as needed for nausea or vomiting. Pt unsure of dose     rOPINIRole (REQUIP) 0.5 MG tablet Take 0.5 mg by mouth at bedtime.   0   spironolactone (ALDACTONE) 25 MG tablet Take 12.5 mg by mouth at bedtime.     carvedilol (COREG) 6.25 MG tablet Take 1 tablet (6.25 mg total) by mouth 2 (two) times daily. 180 tablet 1   clopidogrel (PLAVIX) 75 MG tablet Take 1 tablet (75 mg total) by mouth daily. 90 tablet 3   No current facility-administered medications for this encounter.   BP 114/80    Pulse 73    Wt 52.3 kg (115 lb 3.2 oz)    SpO2 100%    BMI 21.07 kg/m  General: NAD Neck: No JVD, no thyromegaly or thyroid nodule.  Lungs: Clear to auscultation bilaterally with normal respiratory effort. CV: Nondisplaced PMI.  Heart regular S1/S2, no S3/S4, no murmur.  No peripheral edema.  No carotid bruit.  Normal pedal pulses.  Abdomen: Soft, nontender, no hepatosplenomegaly, no distention.  Skin: Intact without lesions or rashes.  Neurologic: Alert and oriented x 3.  Psych: Normal affect. Extremities: No clubbing or cyanosis.  HEENT: Normal.   Assessment/Plan: 1. CAD: S/p NSTEMI in 11/17 with DEX to LCx and DES x 2  to RCA.  Cardiolite in 10/20 with EF <30%, inferior/inferolateral infarct with peri-infarct ischemia. LHC in 4/21 showed nonobstructive mild CAD.  No chest pain.  - Continue atorvastatin and Zetia. Check lipids today.  - Continue ASA 81 and Plavix 75 mg daily for now.  She will likely be a good candidate to replace Plavix with rivaroxaban 2.5 mg bid in the future. She will continue Plavix as long as Dr. Estanislado Pandy feels it is needed post her cranial circulation intervention => I asked her to check with him about when it would be reasonable to stop Plavix.  2. Chronic systolic CHF: Ischemic cardiomyopathy. Echo in 11/22 showed stable EF 30-35%.  RHC in 4/21 with normal filling pressures and preserved cardiac output.  NYHA class II symptoms.  She does not appear volume overloaded by exam but Optivol suggests volume overload.  - She has been off Entresto and on losartan due to low BP/orthostasis in the setting of her GI symptoms (nausea, vomiting).  In the future, I would like to transition her back from losartan to [facility].  - Increase Lasix to 40 mg daily x 3 days then back to 20 mg daily.  - Increase Coreg to 6.25 mg bid.      - Continue spironolactone 25 mg daily. BMET today.  - Continue dapagliflozin 10 mg daily.   3. Carotid stenosis: Order repeat carotid dopplers.  4. Smoking: We again discussed smoking cessation, she is almost off.  5. 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  - Continue Plavix per Dr. Estanislado Pandy.   Followup 3 wks with HF pharmacist for medication titration, see APP in 3 months.   [name] 05/13/2021

## 2021-05-13 NOTE — Telephone Encounter (Signed)
New secondary insurance card scanned into chart today for [employer]. Per NIA website no PA is needed for [employer]: There are no Cases found that match your search.

## 2021-05-13 NOTE — Telephone Encounter (Signed)
LM for patient to return call. Fluid level noted to be high on device check at visit.  Pt left office already. Per Dr Aundra Dubin pt needs to take 40mg  lasix daily for 3 days THEN go back down to her normal dose of 20mg  daily.  Awaiting call back

## 2021-05-15 ENCOUNTER — Emergency Department (HOSPITAL_COMMUNITY): Payer: Commercial Managed Care - PPO

## 2021-05-15 ENCOUNTER — Emergency Department (HOSPITAL_COMMUNITY)
Admission: EM | Admit: 2021-05-15 | Discharge: 2021-05-15 | Disposition: A | Discharge status: Home / Self Care | Payer: Commercial Managed Care - PPO | Attending: Emergency Medicine | Admitting: Emergency Medicine

## 2021-05-15 DIAGNOSIS — E860 Dehydration: Secondary | ICD-10-CM

## 2021-05-15 DIAGNOSIS — Z79899 Other long term (current) drug therapy: Secondary | ICD-10-CM | POA: Insufficient documentation

## 2021-05-15 DIAGNOSIS — R55 Syncope and collapse: Secondary | ICD-10-CM | POA: Diagnosis present

## 2021-05-15 DIAGNOSIS — Z7982 Long term (current) use of aspirin: Secondary | ICD-10-CM | POA: Insufficient documentation

## 2021-05-15 DIAGNOSIS — R42 Dizziness and giddiness: Secondary | ICD-10-CM

## 2021-05-15 LAB — COMPREHENSIVE METABOLIC PANEL
ALT: 22 U/L (ref 0–44)
AST: 30 U/L (ref 15–41)
Albumin: 4 g/dL (ref 3.5–5.0)
Alkaline Phosphatase: 76 U/L (ref 38–126)
Anion gap: 8 (ref 5–15)
BUN: 15 mg/dL (ref 6–20)
CO2: 28 mmol/L (ref 22–32)
Calcium: 9.6 mg/dL (ref 8.9–10.3)
Chloride: 100 mmol/L (ref 98–111)
Creatinine, Ser: 1.02 mg/dL — ABNORMAL HIGH (ref 0.44–1.00)
GFR, Estimated: >60 mL/min (ref >60)
Glucose, Bld: 108 mg/dL — ABNORMAL HIGH (ref 70–99)
Potassium: 5 mmol/L (ref 3.5–5.1)
Sodium: 136 mmol/L (ref 135–145)
Total Bilirubin: 0.8 mg/dL (ref 0.3–1.2)
Total Protein: 7.3 g/dL (ref 6.5–8.1)

## 2021-05-15 LAB — CBC WITH DIFFERENTIAL/PLATELET
Abs Immature Granulocytes: 0.05 10*3/uL (ref 0.00–0.07)
Basophils Absolute: 0.1 10*3/uL (ref 0.0–0.1)
Basophils Relative: 1 %
Eosinophils Absolute: 0.1 10*3/uL (ref 0.0–0.5)
Eosinophils Relative: 1 %
HCT: 41.2 % (ref 36.0–46.0)
Hemoglobin: 13.5 g/dL (ref 12.0–15.0)
Immature Granulocytes: 1 %
Lymphocytes Relative: 20 %
Lymphs Abs: 1.8 10*3/uL (ref 0.7–4.0)
MCH: 28 pg (ref 26.0–34.0)
MCHC: 32.8 g/dL (ref 30.0–36.0)
MCV: 85.3 fL (ref 80.0–100.0)
Monocytes Absolute: 0.7 10*3/uL (ref 0.1–1.0)
Monocytes Relative: 8 %
Neutro Abs: 6.4 10*3/uL (ref 1.7–7.7)
Neutrophils Relative %: 69 %
Platelets: 313 10*3/uL (ref 150–400)
RBC: 4.83 MIL/uL (ref 3.87–5.11)
RDW: 15.7 % — ABNORMAL HIGH (ref 11.5–15.5)
WBC: 9.1 10*3/uL (ref 4.0–10.5)
nRBC: 0 % (ref 0.0–0.2)

## 2021-05-15 LAB — TROPONIN I (HIGH SENSITIVITY)
Troponin I (High Sensitivity): 5 ng/L (ref <18)
Troponin I (High Sensitivity): 5 ng/L (ref <18)

## 2021-05-15 MED ORDER — MECLIZINE HCL 12.5 MG PO TABS
25.0000 mg | ORAL_TABLET | Freq: Once | ORAL | Status: AC
Start: 2021-05-15 — End: 2021-05-15
  Administered 2021-05-15: 25 mg via ORAL

## 2021-05-15 MED ORDER — SODIUM CHLORIDE 0.9 % IV BOLUS
1000.0000 mL | Freq: Once | INTRAVENOUS | Status: AC
  Administered 2021-05-15: 1000 mL via INTRAVENOUS

## 2021-05-15 MED ORDER — MECLIZINE HCL 25 MG PO TABS: 25.0000 mg | ORAL_TABLET | Freq: Three times a day (TID) | ORAL | 0 refills | Status: DC | PRN

## 2021-05-15 MED ORDER — SODIUM CHLORIDE 0.9 % IV BOLUS
250.0000 mL | Freq: Once | INTRAVENOUS | Status: AC
  Administered 2021-05-15: 250 mL via INTRAVENOUS

## 2021-05-15 MED ORDER — MECLIZINE HCL 12.5 MG PO TABS
25.0000 mg | ORAL_TABLET | Freq: Once | ORAL | Status: DC
  Filled 2021-05-15: qty 2

## 2021-05-15 MED ORDER — ONDANSETRON HCL 4 MG/2ML IJ SOLN
4.0000 mg | Freq: Once | INTRAMUSCULAR | Status: AC
  Administered 2021-05-15: 4 mg via INTRAVENOUS
  Filled 2021-05-15: qty 2

## 2021-05-15 NOTE — ED Provider Notes (Signed)
[facility] EMERGENCY DEPARTMENT Provider Note   CSN: [id number] Arrival date & time: 05/15/21  1537     History  Chief Complaint  Patient presents with   Loss of Consciousness    [name] is a 61 y.o. female.  Patient complains of dizziness.  She states there is a spinning.  She also had syncopal episode today.  Patient has a history of congestive heart failure  The history is provided by the patient and medical records. No language interpreter was used.  Loss of Consciousness Episode history:  Single Most recent episode:  Today Timing:  Intermittent Progression:  Waxing and waning Chronicity:  New Context: not blood draw   Witnessed: no   Relieved by:  Nothing Worsened by:  Nothing Ineffective treatments:  None tried Associated symptoms: dizziness   Associated symptoms: no anxiety, no chest pain, no headaches and no seizures       Home Medications Prior to Admission medications   Medication Sig Start Date End Date Taking? Authorizing Provider  albuterol (VENTOLIN HFA) 108 (90 Base) MCG/ACT inhaler Inhale 2 puffs into the lungs every 4 (four) hours as needed. 04/28/21 04/28/22 Yes [provider]  ALPRAZolam Duanne Moron) 1 MG tablet Take 1 mg by mouth See admin instructions. Take 1 mg by mouth at bedtime and an additional 1 mg daily as needed for anxiety   Yes [provider]  aspirin EC 81 MG tablet Take 81 mg by mouth every evening.    Yes [provider]  atorvastatin (LIPITOR) 80 MG tablet Take 1 tablet (80 mg total) by mouth daily at 6 PM. 11/10/20  Yes Larey Dresser, MD  carvedilol (COREG) 6.25 MG tablet Take 1 tablet (6.25 mg total) by mouth 2 (two) times daily. 05/13/21  Yes Larey Dresser, MD  clopidogrel (PLAVIX) 75 MG tablet Take 1 tablet (75 mg total) by mouth daily. 05/13/21  Yes Larey Dresser, MD  cyproheptadine (PERIACTIN) 4 MG tablet Take 1 tablet (4 mg total) by mouth 2 (two) times daily. 04/23/21 05/23/21 Yes Valentina Shaggy, MD  dapagliflozin propanediol (FARXIGA) 5 MG TABS tablet Take 1 tablet (5 mg total) by mouth daily before breakfast. 03/15/21  Yes Nida, Marella Chimes, MD  EPINEPHrine (EPIPEN 2-PAK) 0.3 mg/0.3 mL IJ SOAJ injection Inject 0.3 mg into the muscle as needed for anaphylaxis. 04/23/21  Yes Valentina Shaggy, MD  ezetimibe (ZETIA) 10 MG tablet Take 1 tablet by mouth once daily 04/08/21  Yes Larey Dresser, MD  fluticasone [facility]) 50 MCG/ACT nasal spray Place 2 sprays into both nostrils daily as needed. 04/28/21 04/28/22 Yes [provider]  furosemide (LASIX) 20 MG tablet Take 1 tablet (20 mg total) by mouth daily. 04/13/21 07/12/21 Yes Larey Dresser, MD  gabapentin (NEURONTIN) 600 MG tablet Take 300 mg by mouth daily. 04/30/21  Yes [provider]  loperamide (IMODIUM) 2 MG capsule Take 2 mg by mouth 4 (four) times daily as needed for diarrhea or loose stools (ibs).   Yes [provider]  LORazepam (ATIVAN) 1 MG tablet Take 1 mg by mouth daily as needed. 03/10/21  Yes [provider]  losartan (COZAAR) 25 MG tablet Take 1 tablet (25 mg total) by mouth daily. 03/12/21 06/10/21 Yes Larey Dresser, MD  meclizine (ANTIVERT) 25 MG tablet Take 1 tablet (25 mg total) by mouth 3 (three) times daily as needed for dizziness. 05/15/21  Yes Milton Ferguson, MD  Multiple Vitamins-Minerals (MULTIVITAMIN WITH MINERALS) tablet Take  1 tablet by mouth daily.   Yes [provider]  nicotine (NICODERM CQ - DOSED IN MG/24 HOURS) 21 mg/24hr patch Place 21 mg onto the skin daily.   Yes [provider]  nitroGLYCERIN (NITROSTAT) 0.4 MG SL tablet Place 1 tablet (0.4 mg total) under the tongue every 5 (five) minutes x 3 doses as needed for chest pain (if no relief after 2nd dose, proceed to the ED for an evaluation or call 911). 09/01/20  Yes Verta Ellen., NP  ondansetron (ZOFRAN) 4 MG tablet Take 4 mg by mouth 3 (three) times daily as needed. 03/04/21  Yes  [provider]  pantoprazole (PROTONIX) 40 MG tablet Take 1 tablet (40 mg total) by mouth daily. 05/07/21  Yes Rourk, Cristopher Estimable, MD  promethazine (PHENERGAN) 12.5 MG suppository Place 12.5 mg rectally as needed for nausea or vomiting. Pt unsure of dose   Yes [provider]  rOPINIRole (REQUIP) 0.5 MG tablet Take 0.5 mg by mouth at bedtime.  11/13/17  Yes [provider]  spironolactone (ALDACTONE) 25 MG tablet Take 12.5 mg by mouth at bedtime.   Yes [provider]  gabapentin (NEURONTIN) 100 MG capsule Take 200 mg by mouth at bedtime. Patient not taking: Reported on 05/15/2021    [provider]      Allergies    Other and Tape    Review of Systems   Review of Systems  Constitutional:  Negative for appetite change and fatigue.  HENT:  Negative for congestion, ear discharge and sinus pressure.   Eyes:  Negative for discharge.  Respiratory:  Negative for cough.   Cardiovascular:  Positive for syncope. Negative for chest pain.  Gastrointestinal:  Negative for abdominal pain and diarrhea.  Genitourinary:  Negative for frequency and hematuria.  Musculoskeletal:  Negative for back pain.  Skin:  Negative for rash.  Neurological:  Positive for dizziness. Negative for seizures and headaches.  Psychiatric/Behavioral:  Negative for hallucinations.    Physical Exam Updated Vital Signs BP 118/75    Pulse 79    Temp 97.9 F (36.6 C) (Oral)    Resp (!) 23    Ht 5' (1.524 m)    Wt 52.2 kg    SpO2 99%    BMI 22.46 kg/m  Physical Exam Vitals and nursing note reviewed.  Constitutional:      Appearance: She is well-developed.  HENT:     Head: Normocephalic.     Nose: Nose normal.  Eyes:     General: No scleral icterus.    Conjunctiva/sclera: Conjunctivae normal.  Neck:     Thyroid: No thyromegaly.  Cardiovascular:     Rate and Rhythm: Normal rate and regular rhythm.     Heart sounds: No murmur heard.   No friction rub. No gallop.  Pulmonary:      Breath sounds: No stridor. No wheezing or rales.  Chest:     Chest wall: No tenderness.  Abdominal:     General: There is no distension.     Tenderness: There is no abdominal tenderness. There is no rebound.  Musculoskeletal:        General: Normal range of motion.     Cervical back: Neck supple.  Lymphadenopathy:     Cervical: No cervical adenopathy.  Skin:    Findings: No erythema or rash.  Neurological:     Mental Status: She is alert and oriented to person, place, and time.     Motor: No abnormal muscle tone.  Coordination: Coordination normal.  Psychiatric:        Behavior: Behavior normal.    ED Results / Procedures / Treatments   Labs (all labs ordered are listed, but only abnormal results are displayed) Labs Reviewed  CBC WITH DIFFERENTIAL/PLATELET - Abnormal; Notable for the following components:      Result Value   RDW 15.7 (*)    All other components within normal limits  COMPREHENSIVE METABOLIC PANEL - Abnormal; Notable for the following components:   Glucose, Bld 108 (*)    Creatinine, Ser 1.02 (*)    All other components within normal limits  TROPONIN I (HIGH SENSITIVITY)  TROPONIN I (HIGH SENSITIVITY)    EKG None  Radiology CT Head Wo Contrast  Result Date: 05/15/2021 CLINICAL DATA:  Dizziness EXAM: CT HEAD WITHOUT CONTRAST TECHNIQUE: Contiguous axial images were obtained from the base of the skull through the vertex without intravenous contrast. COMPARISON:  CT brain 12/10/2020, 02/18/2021 FINDINGS: Brain: No acute territorial infarction, hemorrhage, or intracranial mass is seen. Small chronic remote left posterior parietal infarct. Stable ventricle size. Vascular: No hyperdense vessels.  Carotid vascular calcification Skull: Normal. Negative for fracture or focal lesion. Sinuses/Orbits: No acute finding. Other: None IMPRESSION: 1. No CT evidence for acute intracranial abnormality. 2. Stable small remote left posterior parietal infarct Electronically  Signed   By: Donavan Foil M.D.   On: 05/15/2021 17:15   DG Chest Port 1 View  Result Date: 05/15/2021 CLINICAL DATA:  Syncope and weakness. EXAM: PORTABLE CHEST 1 VIEW COMPARISON:  Chest x-ray dated February 20, 2021. FINDINGS: Unchanged left chest wall pacemaker. The heart size and mediastinal contours are within normal limits. Normal pulmonary vascularity. No focal consolidation, pleural effusion, or pneumothorax. No acute osseous abnormality. IMPRESSION: No active disease. Electronically Signed   By: Titus Dubin M.D.   On: 05/15/2021 16:13    Procedures Procedures    Medications Ordered in ED Medications  meclizine (ANTIVERT) tablet 25 mg (25 mg Oral Not Given 05/15/21 1621)  sodium chloride 0.9 % bolus 250 mL (0 mLs Intravenous Stopped 05/15/21 1804)  meclizine (ANTIVERT) tablet 25 mg (25 mg Oral Given 05/15/21 1618)  sodium chloride 0.9 % bolus 1,000 mL (1,000 mLs Intravenous New Bag/Given 05/15/21 2028)  ondansetron (ZOFRAN) injection 4 mg (4 mg Intravenous Given 05/15/21 2028)    ED Course/ Medical Decision Making/ A&P                           Medical Decision Making  Patient with dehydration and vertigo symptoms.  Patient improved.  She will be sent home on Antivert This patient presents to the ED for concern of dizziness, this involves an extensive number of treatment options, and is a complaint that carries with it a high risk of complications and morbidity.  The differential diagnosis includes stroke hypertension     Co morbidities that complicate the patient evaluation  Congestive heart failure   Additional history obtained:  Additional history obtained from patient and relative External records from outside source obtained and reviewed including hospital records   Lab Tests:  I Ordered, and personally interpreted labs.  The pertinent results include: Chemistry and CBC which were unremarkable   Imaging Studies ordered:  I ordered imaging studies including chest  x-ray and CT head I independently visualized and interpreted imaging which showed unremarkable I agree with the radiologist interpretation   Cardiac Monitoring:  The patient was maintained on a cardiac monitor.  I  personally viewed and interpreted the cardiac monitored which showed an underlying rhythm of: Normal sinus rhythm   Medicines ordered and prescription drug management:  I ordered medication including normal saline and Antivert Reevaluation of the patient after these medicines showed that the patient improved I have reviewed the patients home medicines and have made adjustments as needed   Test Considered:  MRI of the head  Critical Interventions: Boluses of IV fluids   Consultations Obtained:  No consulted  Problem List / ED Course: Dizziness which could be related to hypotension or vertigo and dehydration   Reevaluation:  After the interventions noted above, I reevaluated the patient and found that they have :improved   Social Determinants of Health: None   Dispostion:  After consideration of the diagnostic results and the patients response to treatment, I feel that the patent would benefit from discharge home and given Antivert and follow-up closely with PCP.         Final Clinical Impression(s) / ED Diagnoses Final diagnoses:  Syncope and collapse  Dehydration  Vertigo    Rx / DC Orders ED Discharge Orders          Ordered    meclizine (ANTIVERT) 25 MG tablet  3 times daily PRN        05/15/21 2245              [place], MD 05/16/21 1114

## 2021-05-15 NOTE — Discharge Instructions (Signed)
Drink plenty of fluids.  Take the Antivert for dizziness and follow-up with your family doctor next week

## 2021-05-15 NOTE — ED Triage Notes (Signed)
Pt BIB EMS from home c/o dizziness and syncopal episode for about 1 min witnessed by husband while sitting at the dinner table, husband prevented pt from falling, denies hitting head. Pt is ao x 4, reports "the room is spinning" and headache. H/O Heart attack on Plavix, CHF  90/40--> 83/47 HR 78 SPO2 97% RA CBG 104

## 2021-05-17 ENCOUNTER — Other Ambulatory Visit: Payer: Self-pay

## 2021-05-17 ENCOUNTER — Ambulatory Visit (INDEPENDENT_AMBULATORY_CARE_PROVIDER_SITE_OTHER): Payer: Commercial Managed Care - PPO

## 2021-05-17 ENCOUNTER — Ambulatory Visit (HOSPITAL_COMMUNITY)
Admission: RE | Admit: 2021-05-17 | Discharge: 2021-05-17 | Disposition: A | Discharge status: Home / Self Care | Payer: Commercial Managed Care - PPO | Source: Ambulatory Visit | Attending: Internal Medicine | Admitting: Internal Medicine

## 2021-05-17 ENCOUNTER — Encounter (HOSPITAL_COMMUNITY): Payer: Self-pay

## 2021-05-17 DIAGNOSIS — R112 Nausea with vomiting, unspecified: Secondary | ICD-10-CM | POA: Insufficient documentation

## 2021-05-17 DIAGNOSIS — I5042 Chronic combined systolic (congestive) and diastolic (congestive) heart failure: Secondary | ICD-10-CM | POA: Diagnosis not present

## 2021-05-17 DIAGNOSIS — Z95810 Presence of automatic (implantable) cardiac defibrillator: Secondary | ICD-10-CM | POA: Diagnosis not present

## 2021-05-17 MED ORDER — TECHNETIUM TC 99M SULFUR COLLOID
2.0000 | Freq: Once | INTRAVENOUS | Status: AC | PRN
  Administered 2021-05-17: 1.95 via ORAL

## 2021-05-17 NOTE — Progress Notes (Signed)
EPIC Encounter for ICM Monitoring  Patient Name: [name] is a 61 y.o. female Date: 05/17/2021 Primary Care Physican: [name], MD Primary Cardiologist: Aundra Dubin Electrophysiologist: Curt Bears 05/17/2021 Weight: 112 lbs                                                          Spoke with patient and heart failure questions reviewed.  Pt asymptomatic for fluid accumulation.  Reports feeling better and her stomach issues have improved.   ED visit on 1/7.    Per Dr Claris Gladden 1/5 Epic note pt did not appear volume overloaded by exam but Optivol suggests volume overload.  Dr Aundra Dubin provided recommendations 1/5 to increase Lasix 40 mg daily x 3 days then back to 20 mg daily.   Optivol thoracic impedance suggesting possible fluid accumulation starting 04/30/2022.      Prescribed: Furosemide 20 mg Take 1 tablet (20 mg total) by mouth daily   Labs: 05/15/2021 Creatinine 1.02, BUN 15, Potassium 5.0, Sodium 136, GFR >6 05/13/2021 Creatinine 0.81, BUN 15, Potassium 4.5, Sodium 138, GFR >60  04/22/2021 Creatinine 0.99, BUN 12, Potassium 4.1, Sodium 137, GFR >60  04/13/2021 Creatinine 1.01, BUN 11, Potassium 4.6, Sodium 145  03/23/2021 Creatinine 1.47, BUN 23, Potassium 3.4, Sodium 133, GFR 41 02/22/2021 Creatinine 0.99, BUN 13, Potassium 4.1, Sodium 141, GFR >60 A complete set of results can be found in Results Review.   Recommendations:  Copy sent to Dr Aundra Dubin for review and recommendations if needed.    Follow-up plan: ICM clinic phone appointment on 05/24/2021 to recheck fluid levels.   91 day device clinic remote transmission 07/05/2021.     EP/Cardiology Office Visits:  08/11/2021 with [facility] clinic PA/NP.   Copy of ICM check sent to Dr. Curt Bears.    3 month ICM trend: 05/17/2021.    12-14 Month ICM trend:     Rosalene Billings, RN 05/17/2021 11:20 AM

## 2021-05-19 NOTE — Progress Notes (Signed)
Keep Lasix at 40 mg daily.

## 2021-05-21 MED ORDER — FUROSEMIDE 20 MG PO TABS: 40.0000 mg | ORAL_TABLET | Freq: Every day | ORAL | 3 refills | Status: DC

## 2021-05-21 NOTE — Progress Notes (Signed)
Spoke with patient and advised Dr Aundra Dubin recommended she taking Lasix 40 mg daily.  She verbalized understanding and does not need a prescription refill at this time.  Will change ICM remote transmission from 1/16 to 1/18 to check effectiveness of new dosage.

## 2021-05-26 ENCOUNTER — Ambulatory Visit (INDEPENDENT_AMBULATORY_CARE_PROVIDER_SITE_OTHER): Payer: PRIVATE HEALTH INSURANCE

## 2021-05-26 DIAGNOSIS — I5042 Chronic combined systolic (congestive) and diastolic (congestive) heart failure: Secondary | ICD-10-CM

## 2021-05-26 DIAGNOSIS — Z95810 Presence of automatic (implantable) cardiac defibrillator: Secondary | ICD-10-CM

## 2021-05-26 NOTE — Progress Notes (Signed)
EPIC Encounter for ICM Monitoring  Patient Name: Erin [name] is a 61 y.o. female Date: 05/26/2021 Primary Care Physican: [name], MD Primary Cardiologist: Aundra Dubin Electrophysiologist: Curt Bears 05/17/2021 Weight: 112 lbs 05/26/2021 Weight: 112 lbs.                                                          Spoke with patient and heart failure questions reviewed.  Pt asymptomatic for fluid accumulation.  She has eaten restaurant foods for past few days which may be related to impedance dropping just below baseline.    Optivol thoracic impedance suggesting fluid levels returned to normal and trending just below baseline.      Prescribed: Furosemide 20 mg Take 2 tablets (40 mg total) by mouth daily   Labs: 05/15/2021 Creatinine 1.02, BUN 15, Potassium 5.0, Sodium 136, GFR >6 05/13/2021 Creatinine 0.81, BUN 15, Potassium 4.5, Sodium 138, GFR >60  04/22/2021 Creatinine 0.99, BUN 12, Potassium 4.1, Sodium 137, GFR >60  04/13/2021 Creatinine 1.01, BUN 11, Potassium 4.6, Sodium 145  03/23/2021 Creatinine 1.47, BUN 23, Potassium 3.4, Sodium 133, GFR 41 02/22/2021 Creatinine 0.99, BUN 13, Potassium 4.1, Sodium 141, GFR >60 A complete set of results can be found in Results Review.   Recommendations:  Recommendation to limit salt intake to 2000 mg daily and fluid intake to 64 oz daily.  Encouraged to call if experiencing any fluid symptoms.    Follow-up plan: ICM clinic phone appointment on 06/14/2021 to recheck fluid levels.   91 day device clinic remote transmission 07/05/2021.     EP/Cardiology Office Visits:  08/11/2021 with Advanced HF clinic PA/NP.   Copy of ICM check sent to Dr. Curt Bears.    3 month ICM trend: 05/26/2021.    12-14 Month ICM trend:     Rosalene Billings, RN 05/26/2021 2:55 PM

## 2021-05-28 NOTE — Progress Notes (Signed)
Advanced Heart Failure Clinic Note   PCP: [name], MD HF Cardiology: [name]    HPI:  61 y.o. with history of CAD, ischemic cardiomyopathy, and PAD was referred by [name] for evaluation of CHF.  She had NSTEMI in 03/2016 with PCI to prox/mid LCx and RCA.  Subsequently, has developed ischemic cardiomyopathy.  Echo in 05/2019 showed EF 30-35%.  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    RHC/LHC done with exertional chest heaviness and dyspnea in 08/2019 showed normal filling pressures, preserved cardiac output, and nonobstructive mild CAD.  ABIs were normal in 08/2019.    In 12/2019, she had syncope thought to be related to orthostasis from low BP in the setting of cardiac medication titration.  Entresto was decreased.    She was in the ER in 03/2020 with chest pain.  Troponin and COVID-19 negative. Echo in 03/2020 showed EF 30-35%, normal RV.    She was hospitalized in 10/2020 with left-sided weakness/numbness.  No evidence for acute CVA, ?TIA.     Echo in 03/2021 showed EF 30-35% with mild LV dilation, normal RV, mild-moderate MR, IVC normal.    She returned to [facility] for followup of CHF 05/17/21.  Reported that she still smokes about a pack every 2 months.  Weight was stable.  No significant exertional dyspnea or chest pain.  No orthopnea/PND.  She was still working at [employer].  Main complaint had been 6 months of nausea/vomiting/anorexia.  This had finally improved.  It was not clear what caused it, she had been seeing a GI MD, there was some concern for alpha-gal allergy versus gastroparesis.   Today she returns to HF clinic for pharmacist medication titration. At last visit with MD, carvedilol was increased to 6.25 mg BID. She had an ED visit for syncopal episode/dizziness 05/15/21 that was presumed to be related to dehydration or hypotension. EKG showed NSR. She received NS and meclizine and symptoms improved.  Main issue  continues to be nausea/vomitting/weight loss. She saw Dr. Ernst Bowler  for her alpha-gal  allergy on 06/07/21. Her cyproheptadine was increased to 8 mg BID. She was recently approved for disability as she is no longer able to work. Says she thinks if it wasn't for the alpha-gal allergy she would be feeling normal. Some days she is unable to eat at all and just the smell of beef/pork makes her nauseous. Has occasional dizziness which she believes is due to the alpha-gal and not being able to eat much. She had an episode of dizziness yesterday. She took a dose of meclizine yesterday and it did help some. Says her BP during the dizziness episode was 101/67, which is normal for her. She says she holds her BP medications if her SBP at home is <100 as instructed when she was discharged from the hospital. No significant exertional dyspnea or chest pain.  No orthopnea/PND. Weight has been stable.    HF Medications: Carvedilol 6.25 mg BID Losartan 25 mg daily Spironolactone 12.5 mg daily Farxiga 5 mg daily Furosemide 40 mg daily   Does the patient have any problems obtaining medications due to transportation or finances?   [employer] Medicaid  Understanding of regimen: good Understanding of indications: good Potential of compliance: good Patient understands to avoid NSAIDs. Patient understands to avoid decongestants.    Pertinent Lab Values: 05/15/21: Serum creatinine 1.02, BUN 15, Potassium 5.0, Sodium 136  Vital Signs: Weight: 113.4 lbs (last clinic weight: 115.2  lbs) Blood pressure: 94/58  Heart rate: 85   Assessment/Plan: 1. CAD: S/p NSTEMI in 03/2016 with DEX to LCx and DES x 2 to RCA.  Cardiolite in 02/2019 with EF <30%, inferior/inferolateral infarct with peri-infarct ischemia. LHC in 08/2019 showed nonobstructive mild CAD.  No chest pain.  - Continue atorvastatin and Zetia.  - Continue ASA 81 and Plavix 75 mg daily for now.  Per [name], She would likely be a good candidate to replace  Plavix with rivaroxaban 2.5 mg BID in the future. She will continue Plavix as long as Dr. Estanislado Pandy feels it is needed post her cranial circulation intervention. 2. Chronic systolic CHF: Ischemic cardiomyopathy. Echo in 03/2021 showed stable EF 30-35%.  RHC in 08/2019 with normal filling pressures and preserved cardiac output.   - NYHA class II symptoms.  Euvolemic on exam. Optivol impedence 05/26/21 suggested fluid levels have returned to normal and were trending just below baseline.  - Continue Lasix 40 mg daily - Continue carvedilol 6.25 mg BID.  - Continue losartan 25 mg daily     - Continue spironolactone 12.5 mg daily.  - Continue dapagliflozin 5 mg daily.   - Will not make any medication changes today given recent syncopal episode and low BP in clinic.  3. Carotid stenosis: Order repeat carotid dopplers.  4. Smoking: We again discussed smoking cessation, she is almost off. Has been using the same pack since November.  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  - Continue Plavix per Dr. Estanislado Pandy.     Follow up 2 months with [facility] [place], PharmD, BCPS, BCCP, CPP Heart Failure Clinic Pharmacist [phone]

## 2021-05-30 LAB — H. PYLORI ANTIGEN, STOOL: H pylori Ag, Stl: NEGATIVE

## 2021-06-04 ENCOUNTER — Other Ambulatory Visit: Payer: Self-pay

## 2021-06-04 ENCOUNTER — Encounter: Payer: Self-pay | Admitting: Allergy & Immunology

## 2021-06-04 ENCOUNTER — Ambulatory Visit (INDEPENDENT_AMBULATORY_CARE_PROVIDER_SITE_OTHER): Payer: Commercial Managed Care - PPO | Admitting: Allergy & Immunology

## 2021-06-04 VITALS — BP 90/82 | HR 88 | Temp 97.9°F | Resp 16 | Ht 60.0 in | Wt 112.0 lb

## 2021-06-04 DIAGNOSIS — R110 Nausea: Secondary | ICD-10-CM | POA: Diagnosis not present

## 2021-06-04 DIAGNOSIS — T7800XD Anaphylactic reaction due to unspecified food, subsequent encounter: Secondary | ICD-10-CM

## 2021-06-04 MED ORDER — CYPROHEPTADINE HCL 4 MG PO TABS: 8.0000 mg | ORAL_TABLET | Freq: Two times a day (BID) | ORAL | 5 refills | Status: DC

## 2021-06-04 NOTE — Progress Notes (Signed)
FOLLOW UP  Date of Service/Encounter:  06/04/21   Assessment:   Nausea   Anaphylactic shock due to food - alpha gal syndrome with additional sensitization to sesame  Recently approved for disability for her cardiac conditions  Plan/Recommendations:   1. Nausea - We will put you in for a second opinion for Dr. Juanita Craver. - Hopefully we will make that happen.  - Continue to avoid mammalian meats. - Hopefully not working near [employer] will help with you getting on disability. - Increase the Periactin to 8 mg twice daily.   2. Return in about 3 months (around 09/02/2021).    Subjective:   Erin [name] is a 61 y.o. female presenting today for follow up of  Chief Complaint  Patient presents with   Nausea    Still having nausea, says it has gotten a little better but it is annoying.     Erin [name] has a history of the following: Patient Active Problem List   Diagnosis Date Noted   Hypocortisolemia ([place]) 03/02/2021   Adrenal adenoma, left 03/02/2021   AKI (acute kidney injury) ([place]) 01/04/2021   Intractable nausea and vomiting 01/03/2021   Left-sided weakness 10/19/2020   Cough 02/11/2020   Near syncope 12/10/2019   Brain aneurysm 04/15/2019   Dysphagia 02/27/2018   Encounter for screening colonoscopy 02/27/2018   History of Clostridium difficile infection 02/27/2018   Chronic diarrhea 12/20/2017   Chronic combined systolic and diastolic congestive heart failure ([place]) 06/20/2016   Hypokalemia due to excessive gastrointestinal loss of potassium    Acute CHF (congestive heart failure) ([place]) 05/16/2016   Acute on chronic systolic CHF (congestive heart failure) ([place]) 05/16/2016   Acute respiratory failure with hypoxia ([facility])    Thrombocytosis 03/26/2016   Cardiomyopathy, ischemic 03/25/2016   Chronic combined systolic and diastolic heart failure ([place]) 03/25/2016   Anxiety state 03/25/2016   Troponin level elevated 03/25/2016   Coronary artery disease  involving native coronary artery of native heart 03/25/2016   Normocytic anemia 03/25/2016   SOB (shortness of breath) 03/24/2016   Lightheadedness 03/17/2016   Hypotension 03/17/2016   Tobacco abuse 03/12/2016   NSTEMI (non-ST elevated myocardial infarction) ([place]) 03/11/2016   Atypical chest pain    Essential hypertension 09/06/2015   Mixed hyperlipidemia 09/06/2015   GERD without esophagitis 09/06/2015   Chest pain 09/06/2015    History obtained from: chart review and patient.  Erin is a 61 y.o. female presenting for a follow up visit.  She was last seen in December 2022 for evaluation of nausea and vomiting.  She had testing that was positive to lamb and beef as well as sesame.  We were not sure about the relevance of sesame.  We referred her to [facility] for a second opinion.  We started her on Periactin 4 mg twice daily to see if this would help with appetite stimulation.  We obtained limited labs including a tryptase and milk IgE which were negative.  Since last visit, she has continued to have the same symptoms. She sees dr. Gala Romney on February 2nd. Stool studies were all normal.   We referred for a second opinion but they declined that because she was followed by [facility]. Since both practices are under [facility], they did not see her at all. Evidently she has seen [facility] in the the distant past.   She did use the Periactin for one week and it worked fairly well. But then it stopped working after  that. She did not try increasing her dosing.   She continues to get sick with certain smells. This is especially prominent at home when her daughter cooks bacon and other red meats. This happens at work as well. She is working on the floor above [employer] so the smells. Work is a Production manager. She did recently get approved for disability. She has asked her job to work part time and she recently put in a two week notice. They will not let her work part time.    She has been drinking chicken broth and eating chicken noodle soup. She has continued to lose weight. She lives with her husband and 8 grand children as well as her daughter. It seems like the household is fairly chaotic.   Otherwise, there have been no changes to her past medical history, surgical history, family history, or social history.    Review of Systems  Constitutional: Negative.  Negative for chills, fever, malaise/fatigue and weight loss.  HENT: Negative.  Negative for congestion, ear discharge and ear pain.   Eyes:  Negative for pain, discharge and redness.  Respiratory:  Negative for cough, sputum production, shortness of breath and wheezing.   Cardiovascular: Negative.  Negative for chest pain and palpitations.  Gastrointestinal:  Positive for abdominal pain and nausea. Negative for constipation, diarrhea, heartburn and vomiting.  Skin: Negative.  Negative for itching and rash.  Neurological:  Negative for dizziness and headaches.  Endo/Heme/Allergies:  Negative for environmental allergies. Does not bruise/bleed easily.      Objective:   Blood pressure 90/82, pulse 88, temperature 97.9 F (36.6 C), temperature source Temporal, resp. rate 16, height 5' (1.524 m), weight 112 lb (50.8 kg), SpO2 98 %. Body mass index is 21.87 kg/m.   Physical Exam:  Physical Exam Vitals reviewed.  Constitutional:      Appearance: She is well-developed. She is not ill-appearing.  HENT:     Head: Normocephalic and atraumatic.     Right Ear: Tympanic membrane, ear canal and external ear normal. No drainage, swelling or tenderness. Tympanic membrane is not injected, scarred, erythematous, retracted or bulging.     Left Ear: Tympanic membrane, ear canal and external ear normal. No drainage, swelling or tenderness. Tympanic membrane is not injected, scarred, erythematous, retracted or bulging.     Nose: No nasal deformity, septal deviation, mucosal edema or rhinorrhea.     Right  Turbinates: Enlarged, swollen and pale.     Left Turbinates: Enlarged, swollen and pale.     Right Sinus: No maxillary sinus tenderness or frontal sinus tenderness.     Left Sinus: No maxillary sinus tenderness or frontal sinus tenderness.     Mouth/Throat:     Mouth: Mucous membranes are not pale and not dry.     Pharynx: Uvula midline.  Eyes:     General:        Right eye: No discharge.        Left eye: No discharge.     Conjunctiva/sclera: Conjunctivae normal.     Right eye: Right conjunctiva is not injected. No chemosis.    Left eye: Left conjunctiva is not injected. No chemosis.    Pupils: Pupils are equal, round, and reactive to light.  Cardiovascular:     Rate and Rhythm: Normal rate and regular rhythm.     Heart sounds: Normal heart sounds.  Pulmonary:     Effort: Pulmonary effort is normal. No tachypnea, accessory muscle usage or respiratory distress.  Breath sounds: Normal breath sounds. No wheezing, rhonchi or rales.     Comments: Moving air well in all lung fields. No increased work of breathing noted. Chest:     Chest wall: No tenderness.  Lymphadenopathy:     Head:     Right side of head: No submandibular, tonsillar or occipital adenopathy.     Left side of head: No submandibular, tonsillar or occipital adenopathy.     Cervical: No cervical adenopathy.  Skin:    Coloration: Skin is not pale.     Findings: No abrasion, erythema, petechiae or rash. Rash is not papular, urticarial or vesicular.  Neurological:     Mental Status: She is alert.  Psychiatric:        Behavior: Behavior is cooperative.     Diagnostic studies: none     Salvatore Marvel, MD  [facility] [place] [place]

## 2021-06-04 NOTE — Patient Instructions (Addendum)
1. Nausea - We will put you in for a second opinion for Dr. Juanita Craver. - Hopefully we will make that happen.  - Continue to avoid mammalian meats. - Hopefully not working near [employer] will help with you getting on disability. - Increase the Periactin to 8 mg twice daily.   2. Return in about 3 months (around 09/02/2021).    Please inform us of any Emergency Department visits, hospitalizations, or changes in symptoms. Call us before going to the ED for breathing or allergy symptoms since we might be able to fit you in for a sick visit. Feel free to contact us anytime with any questions, problems, or concerns.  It was a pleasure to see you again today!  Websites that have reliable patient information: 1. American Academy of Asthma, Allergy, and Immunology: www.aaaai.org 2. Food Allergy Research and Education (FARE): foodallergy.org 3. Mothers of Asthmatics: http://www.asthmacommunitynetwork.org 4. American College of Allergy, Asthma, and Immunology: www.acaai.org   COVID-19 Vaccine Information can be found at: ShippingScam.co.uk For questions related to vaccine distribution or appointments, please email vaccine@[place] .com or call [phone].   We realize that you might be concerned about having an allergic reaction to the COVID19 vaccines. To help with that concern, WE ARE OFFERING THE COVID19 VACCINES IN OUR OFFICE! Ask the front desk for dates!     Like [place] on [place] and Instagram for our latest updates!      A healthy democracy works best when [employer] participate! Make sure you are registered to vote! If you have moved or changed any of your contact information, you will need to get this updated before voting!  In some cases, you MAY be able to register to vote online: [email or web address]

## 2021-06-07 ENCOUNTER — Telehealth: Payer: Self-pay

## 2021-06-07 NOTE — Telephone Encounter (Signed)
Pt's insurance denied PA for pantoprazole sodium 40 mg stating that the pt must use OTC PPI's.

## 2021-06-08 ENCOUNTER — Telehealth: Payer: Self-pay

## 2021-06-08 NOTE — Telephone Encounter (Signed)
-----   Message from Valentina Shaggy, MD sent at 06/04/2021  1:12 PM EST ----- Please refer to Dr. Collene Mares for a second opinion on her nausea.

## 2021-06-09 NOTE — Telephone Encounter (Signed)
Thank you, Dee!   Miko Sirico, MD [facility]  

## 2021-06-10 ENCOUNTER — Other Ambulatory Visit: Payer: Self-pay

## 2021-06-10 ENCOUNTER — Ambulatory Visit (HOSPITAL_COMMUNITY)
Admission: RE | Admit: 2021-06-10 | Discharge: 2021-06-10 | Disposition: A | Discharge status: Home / Self Care | Payer: Commercial Managed Care - PPO | Source: Ambulatory Visit | Attending: Internal Medicine | Admitting: Internal Medicine

## 2021-06-10 VITALS — BP 94/58 | HR 85 | Wt 113.4 lb

## 2021-06-10 DIAGNOSIS — Z7984 Long term (current) use of oral hypoglycemic drugs: Secondary | ICD-10-CM | POA: Diagnosis not present

## 2021-06-10 DIAGNOSIS — R630 Anorexia: Secondary | ICD-10-CM | POA: Insufficient documentation

## 2021-06-10 DIAGNOSIS — Z7982 Long term (current) use of aspirin: Secondary | ICD-10-CM | POA: Insufficient documentation

## 2021-06-10 DIAGNOSIS — Z79899 Other long term (current) drug therapy: Secondary | ICD-10-CM | POA: Insufficient documentation

## 2021-06-10 DIAGNOSIS — I252 Old myocardial infarction: Secondary | ICD-10-CM | POA: Insufficient documentation

## 2021-06-10 DIAGNOSIS — Z955 Presence of coronary angioplasty implant and graft: Secondary | ICD-10-CM | POA: Diagnosis not present

## 2021-06-10 DIAGNOSIS — F17210 Nicotine dependence, cigarettes, uncomplicated: Secondary | ICD-10-CM | POA: Diagnosis not present

## 2021-06-10 DIAGNOSIS — I2510 Atherosclerotic heart disease of native coronary artery without angina pectoris: Secondary | ICD-10-CM | POA: Diagnosis present

## 2021-06-10 DIAGNOSIS — Z888 Allergy status to other drugs, medicaments and biological substances status: Secondary | ICD-10-CM | POA: Insufficient documentation

## 2021-06-10 DIAGNOSIS — I255 Ischemic cardiomyopathy: Secondary | ICD-10-CM | POA: Diagnosis not present

## 2021-06-10 DIAGNOSIS — I5022 Chronic systolic (congestive) heart failure: Secondary | ICD-10-CM | POA: Diagnosis not present

## 2021-06-10 DIAGNOSIS — Z7902 Long term (current) use of antithrombotics/antiplatelets: Secondary | ICD-10-CM | POA: Insufficient documentation

## 2021-06-10 DIAGNOSIS — I739 Peripheral vascular disease, unspecified: Secondary | ICD-10-CM | POA: Diagnosis not present

## 2021-06-10 DIAGNOSIS — I6529 Occlusion and stenosis of unspecified carotid artery: Secondary | ICD-10-CM | POA: Diagnosis not present

## 2021-06-10 DIAGNOSIS — R112 Nausea with vomiting, unspecified: Secondary | ICD-10-CM | POA: Diagnosis not present

## 2021-06-10 NOTE — Telephone Encounter (Signed)
Patients referral was denied by Dr. Collene Mares. The referral coordinator stated she did not give a reason.  Dr Ernst Bowler, how do you want to move forward with this referral?  Thanks

## 2021-06-10 NOTE — Patient Instructions (Signed)
It was a pleasure seeing you today!  MEDICATIONS: -No medication changes today -Call if you have questions about your medications.  NEXT APPOINTMENT: Return to clinic in 2 months with [facility].  In general, to take care of your heart failure: -Limit your fluid intake to 2 Liters (half-gallon) per day.   -Limit your salt intake to ideally 2-3 grams (2000-3000 mg) per day. -Weigh yourself daily and record, and bring that "weight diary" to your next appointment.  (Weight gain of 2-3 pounds in 1 day typically means fluid weight.) -The medications for your heart are to help your heart and help you live longer.   -Please contact us before stopping any of your heart medications.  Call the clinic at [phone] with questions or to reschedule future appointments.

## 2021-06-11 ENCOUNTER — Telehealth (HOSPITAL_COMMUNITY): Payer: Self-pay | Admitting: *Deleted

## 2021-06-11 ENCOUNTER — Ambulatory Visit (INDEPENDENT_AMBULATORY_CARE_PROVIDER_SITE_OTHER): Payer: Commercial Managed Care - PPO | Admitting: Obstetrics & Gynecology

## 2021-06-11 ENCOUNTER — Encounter: Payer: Self-pay | Admitting: Obstetrics & Gynecology

## 2021-06-11 VITALS — BP 118/73 | HR 78 | Ht 60.0 in | Wt 115.0 lb

## 2021-06-11 DIAGNOSIS — N644 Mastodynia: Secondary | ICD-10-CM

## 2021-06-11 DIAGNOSIS — Z1212 Encounter for screening for malignant neoplasm of rectum: Secondary | ICD-10-CM

## 2021-06-11 DIAGNOSIS — N952 Postmenopausal atrophic vaginitis: Secondary | ICD-10-CM | POA: Diagnosis not present

## 2021-06-11 DIAGNOSIS — Z1211 Encounter for screening for malignant neoplasm of colon: Secondary | ICD-10-CM

## 2021-06-11 DIAGNOSIS — N9089 Other specified noninflammatory disorders of vulva and perineum: Secondary | ICD-10-CM | POA: Diagnosis not present

## 2021-06-11 NOTE — Telephone Encounter (Signed)
Pt called stating she went to pick up farxiga and it is now $50. Pt said she is on disability and cant afford it.   Routed to [facility], CPhT

## 2021-06-11 NOTE — Telephone Encounter (Signed)
Patient states the pantoprazole was denied. Please advise to a change.

## 2021-06-11 NOTE — Telephone Encounter (Signed)
Is there a different medication you would like to try and chang the patient to?

## 2021-06-11 NOTE — Progress Notes (Signed)
Chief Complaint  Patient presents with   Gynecologic Exam    Breast pain and feels a knot in vaginal area that comes and goes.       61 y.o. [id number] No LMP recorded. Patient has had a hysterectomy. The current method of family planning is status post hysterectomy.  Outpatient Encounter Medications as of 06/11/2021  Medication Sig   albuterol (VENTOLIN HFA) 108 (90 Base) MCG/ACT inhaler Inhale 2 puffs into the lungs every 4 (four) hours as needed.   ALPRAZolam (XANAX) 1 MG tablet Take 1 mg by mouth See admin instructions. Take 1 mg by mouth at bedtime and an additional 1 mg daily as needed for anxiety   aspirin EC 81 MG tablet Take 81 mg by mouth every evening.    atorvastatin (LIPITOR) 80 MG tablet Take 1 tablet (80 mg total) by mouth daily at 6 PM.   carvedilol (COREG) 6.25 MG tablet Take 1 tablet (6.25 mg total) by mouth 2 (two) times daily.   clopidogrel (PLAVIX) 75 MG tablet Take 1 tablet (75 mg total) by mouth daily.   cyproheptadine (PERIACTIN) 4 MG tablet Take 2 tablets (8 mg total) by mouth 2 (two) times daily.   dapagliflozin propanediol (FARXIGA) 5 MG TABS tablet Take 1 tablet (5 mg total) by mouth daily before breakfast.   EPINEPHrine (EPIPEN 2-PAK) 0.3 mg/0.3 mL IJ SOAJ injection Inject 0.3 mg into the muscle as needed for anaphylaxis.   ezetimibe (ZETIA) 10 MG tablet Take 1 tablet by mouth once daily   fluticasone (FLONASE) 50 MCG/ACT nasal spray Place 2 sprays into both nostrils daily as needed.   furosemide (LASIX) 20 MG tablet Take 2 tablets (40 mg total) by mouth daily.   gabapentin (NEURONTIN) 600 MG tablet Take 300 mg by mouth daily.   loperamide (IMODIUM) 2 MG capsule Take 2 mg by mouth 4 (four) times daily as needed for diarrhea or loose stools (ibs).   LORazepam (ATIVAN) 1 MG tablet Take 1 mg by mouth daily as needed.   losartan (COZAAR) 25 MG tablet Take 1 tablet (25 mg total) by mouth daily.   meclizine (ANTIVERT) 25 MG tablet Take 1 tablet (25 mg total) by  mouth 3 (three) times daily as needed for dizziness.   Multiple Vitamins-Minerals (MULTIVITAMIN WITH MINERALS) tablet Take 1 tablet by mouth daily.   nitroGLYCERIN (NITROSTAT) 0.4 MG SL tablet Place 1 tablet (0.4 mg total) under the tongue every 5 (five) minutes x 3 doses as needed for chest pain (if no relief after 2nd dose, proceed to the ED for an evaluation or call 911).   ondansetron (ZOFRAN) 4 MG tablet Take 4 mg by mouth 3 (three) times daily as needed.   pantoprazole (PROTONIX) 40 MG tablet Take 1 tablet (40 mg total) by mouth daily.   promethazine (PHENERGAN) 12.5 MG suppository Place 12.5 mg rectally as needed for nausea or vomiting. Pt unsure of dose   rOPINIRole (REQUIP) 0.5 MG tablet Take 0.5 mg by mouth at bedtime.    spironolactone (ALDACTONE) 25 MG tablet Take 12.5 mg by mouth at bedtime.   No facility-administered encounter medications on file as of 06/11/2021.    Subjective Pt noted a bothersome area on the left vulva about 2 months ago, no drainage  Never happened before No recent history of antibiotics Also some fullness sensation in right breast No discharge or nipple changes Mammogram in 12/2020 Past Medical History:  Diagnosis Date   Anxiety state 03/25/2016   Basal  cell carcinoma of forehead    Brain aneurysm    CHF (congestive heart failure) ([place])    Coronary artery disease    a. 03/11/16 PCI with DES-->Prox/Mid Cx;  b. 03/14/16 PCI with DES x2-->RCA, EF 30-35%.   Essential hypertension    GERD (gastroesophageal reflux disease)    HFrEF (heart failure with reduced ejection fraction) ([place])    a. 10/2016 Echo: EF 35-40%, Gr1 DD, mild focal basal septal hypertrophy, basal inflat, mid inflat, basal antlat AK. Mid infept/inf/antlat, apical lateral sev HK. Mod MR. mildly reduced RV fxn. Mild TR.   History of pneumonia    Hyperlipidemia    IBS (irritable bowel syndrome)    Ischemic cardiomyopathy    a. 10/2016 Echo: EF 35-40%, Gr1 DD.   Mitral regurgitation    NSTEMI  (non-ST elevated myocardial infarction) ([place]) 03/10/2016   Pneumonia 03/2016   Squamous cell cancer of skin of nose    Thrombocytosis 03/26/2016   Tobacco abuse    Trichimoniasis    Wears dentures    Wears glasses     Past Surgical History:  Procedure Laterality Date   APPENDECTOMY     BIOPSY  09/20/2018   Procedure: BIOPSY;  Surgeon: Daneil Dolin, MD;  [facility];  Service: Endoscopy;;  colon   BIOPSY  01/05/2021   Procedure: BIOPSY;  Surgeon: Milus Banister, MD;  Location: [facility] ENDOSCOPY;  Service: Endoscopy;;   CARDIAC CATHETERIZATION N/A 03/11/2016   Procedure: Left Heart Cath and Coronary Angiography;  Surgeon: Leonie Man, MD;  Location: [place] [facility];  Service: Cardiovascular;  Laterality: N/A;   CARDIAC CATHETERIZATION N/A 03/11/2016   Procedure: Coronary Stent Intervention;  Surgeon: Leonie Man, MD;  Location: [place] [facility];  Service: Cardiovascular;  Laterality: N/A;   CARDIAC CATHETERIZATION N/A 03/14/2016   Procedure: Coronary Stent Intervention;  Surgeon: Peter M Martinique, MD;  Location: [place] [facility];  Service: Cardiovascular;  Laterality: N/A;   CHOLECYSTECTOMY OPEN  1984   COLONOSCOPY WITH PROPOFOL N/A 09/20/2018   Procedure: COLONOSCOPY WITH PROPOFOL;  Surgeon: Daneil Dolin, MD;  [facility];  Service: Endoscopy;  Laterality: N/A;  10:30am   CORONARY ANGIOPLASTY WITH STENT PLACEMENT  03/14/2016   ESOPHAGOGASTRODUODENOSCOPY (EGD) WITH PROPOFOL N/A 09/20/2018   Procedure: ESOPHAGOGASTRODUODENOSCOPY (EGD) WITH PROPOFOL;  Surgeon: Daneil Dolin, MD;  [facility];  Service: Endoscopy;  Laterality: N/A;   ESOPHAGOGASTRODUODENOSCOPY (EGD) WITH PROPOFOL N/A 01/05/2021   Procedure: ESOPHAGOGASTRODUODENOSCOPY (EGD) WITH PROPOFOL;  Surgeon: Milus Banister, MD;  Location: [facility] ENDOSCOPY;  Service: Endoscopy;  Laterality: N/A;   FINGER ARTHROPLASTY Left 05/14/2013   Procedure: LEFT THUMB CARPAL METACARPAL ARTHROPLASTY;   Surgeon: Tennis Must, MD;  Location: [facility];  Service: Orthopedics;  Laterality: Left;   ICD IMPLANT N/A 04/03/2020   Procedure: ICD IMPLANT;  Surgeon: Constance Haw, MD;  Location: [place] [facility];  Service: Cardiovascular;  Laterality: N/A;   IR ANGIO INTRA EXTRACRAN SEL COM CAROTID INNOMINATE BILAT MOD SED  01/05/2017   IR ANGIO INTRA EXTRACRAN SEL COM CAROTID INNOMINATE BILAT MOD SED  03/19/2019   IR ANGIO INTRA EXTRACRAN SEL COM CAROTID INNOMINATE BILAT MOD SED  06/04/2020   IR ANGIO INTRA EXTRACRAN SEL INTERNAL CAROTID UNI L MOD SED  04/15/2019   IR ANGIO VERTEBRAL SEL VERTEBRAL BILAT MOD SED  01/05/2017   IR ANGIO VERTEBRAL SEL VERTEBRAL BILAT MOD SED  03/19/2019   IR ANGIO VERTEBRAL SEL VERTEBRAL  UNI L MOD SED  06/04/2020   IR ANGIOGRAM FOLLOW UP STUDY  04/15/2019   IR RADIOLOGIST EVAL & MGMT  12/30/2016   IR TRANSCATH/EMBOLIZ  04/15/2019   IR US GUIDE VASC ACCESS RIGHT  03/19/2019   IR US GUIDE VASC ACCESS RIGHT  06/04/2020   MALONEY DILATION N/A 09/20/2018   Procedure: Venia Minks DILATION;  Surgeon: Daneil Dolin, MD;  [facility];  Service: Endoscopy;  Laterality: N/A;   RADIOLOGY WITH ANESTHESIA N/A 04/15/2019   Procedure: [employer];  Surgeon: Luanne Bras, MD;  Location: [facility] [place];  Service: Radiology;  Laterality: N/A;   RIGHT/LEFT HEART CATH AND CORONARY ANGIOGRAPHY N/A 08/19/2019   Procedure: RIGHT/LEFT HEART CATH AND CORONARY ANGIOGRAPHY;  Surgeon: Larey Dresser, MD;  Location: [place] [facility];  Service: Cardiovascular;  Laterality: N/A;   TUBAL LIGATION  1987   VAGINAL HYSTERECTOMY  2009    OB History     Gravida  4   Para  4   Term  4   Preterm      AB      Living  4      SAB      IAB      Ectopic      Multiple      Live Births              Allergies  Allergen Reactions   Other Shortness Of Breath, Diarrhea, Nausea And Vomiting and Nausea Only    All- red meats   Tape Other (See Comments)    PEELS  SKIN OFF  (PAPER TAPE IS FINE)    Social History   Socioeconomic History   Marital status: Married    Spouse name: Not on file   Number of children: Not on file   Years of education: Not on file   Highest education level: Not on file  Occupational History   Occupation: CNA  Tobacco Use   Smoking status: Some Days    Packs/day: 0.10    Years: 15.00    Pack years: 1.50    Types: Cigarettes   Smokeless tobacco: Never   Tobacco comments:    smokes a cigarette occasionally  Vaping Use   Vaping Use: Never used  Substance and Sexual Activity   Alcohol use: Yes    Comment: occasionally   Drug use: Not Currently    Types: Marijuana    Comment: former- 2017 last time   Sexual activity: Not Currently    Birth control/protection: Surgical    Comment: hyst  Other Topics Concern   Not on file  Social History Narrative   Lives with husband in [place] in a one story home with a basement.  Has 4 children.  Works as a Quarry manager.  Education: CNA school.    Social Determinants of Health   Financial Resource Strain: Low Risk    Difficulty of Paying Living Expenses: Not hard at all  Food Insecurity: No Food Insecurity   Worried About Charity fundraiser in the Last Year: Never true   [place] in the Last Year: Never true  Transportation Needs: No Transportation Needs   Lack of Transportation (Medical): No   Lack of Transportation (Non-Medical): No  Physical Activity: Insufficiently Active   Days of Exercise per Week: 4 days   Minutes of Exercise per Session: 30 min  Stress: Stress Concern Present   Feeling of Stress : Very much  Social Connections: Socially Isolated   Frequency of  Communication with Friends and Family: Once a week   Frequency of Social Gatherings with Friends and Family: Never   Attends Religious Services: Never   Marine scientist or Organizations: No   Attends Music therapist: Never   Marital Status: Married    Family History  Problem  Relation Age of Onset   Stroke Mother    Hypertension Mother    Diabetes Mother    Heart attack Mother    Heart attack Father    Diabetes Father    Hypertension Father    CAD Father    Colon polyps Father 57       pre-cancerous    Stroke Father    Dementia Father    Hyperlipidemia Father    Breast cancer Maternal Grandmother    Cancer Maternal Grandfather        Tongue and esophageal   Heart failure Other    Colon cancer Neg Hx     Medications:       Current Outpatient Medications:    albuterol (VENTOLIN HFA) 108 (90 Base) MCG/ACT inhaler, Inhale 2 puffs into the lungs every 4 (four) hours as needed., Disp: , Rfl:    ALPRAZolam (XANAX) 1 MG tablet, Take 1 mg by mouth See admin instructions. Take 1 mg by mouth at bedtime and an additional 1 mg daily as needed for anxiety, Disp: , Rfl:    aspirin EC 81 MG tablet, Take 81 mg by mouth every evening. , Disp: , Rfl:    atorvastatin (LIPITOR) 80 MG tablet, Take 1 tablet (80 mg total) by mouth daily at 6 PM., Disp: 90 tablet, Rfl: 3   carvedilol (COREG) 6.25 MG tablet, Take 1 tablet (6.25 mg total) by mouth 2 (two) times daily., Disp: 180 tablet, Rfl: 1   clopidogrel (PLAVIX) 75 MG tablet, Take 1 tablet (75 mg total) by mouth daily., Disp: 90 tablet, Rfl: 3   cyproheptadine (PERIACTIN) 4 MG tablet, Take 2 tablets (8 mg total) by mouth 2 (two) times daily., Disp: 120 tablet, Rfl: 5   dapagliflozin propanediol (FARXIGA) 5 MG TABS tablet, Take 1 tablet (5 mg total) by mouth daily before breakfast., Disp: 90 tablet, Rfl: 1   EPINEPHrine (EPIPEN 2-PAK) 0.3 mg/0.3 mL IJ SOAJ injection, Inject 0.3 mg into the muscle as needed for anaphylaxis., Disp: 2 each, Rfl: 2   ezetimibe (ZETIA) 10 MG tablet, Take 1 tablet by mouth once daily, Disp: 30 tablet, Rfl: 12   fluticasone (FLONASE) 50 MCG/ACT nasal spray, Place 2 sprays into both nostrils daily as needed., Disp: , Rfl:    furosemide (LASIX) 20 MG tablet, Take 2 tablets (40 mg total) by mouth daily.,  Disp: 180 tablet, Rfl: 3   gabapentin (NEURONTIN) 600 MG tablet, Take 300 mg by mouth daily., Disp: , Rfl:    loperamide (IMODIUM) 2 MG capsule, Take 2 mg by mouth 4 (four) times daily as needed for diarrhea or loose stools (ibs)., Disp: , Rfl:    LORazepam (ATIVAN) 1 MG tablet, Take 1 mg by mouth daily as needed., Disp: , Rfl:    losartan (COZAAR) 25 MG tablet, Take 1 tablet (25 mg total) by mouth daily., Disp: 90 tablet, Rfl: 3   meclizine (ANTIVERT) 25 MG tablet, Take 1 tablet (25 mg total) by mouth 3 (three) times daily as needed for dizziness., Disp: 10 tablet, Rfl: 0   Multiple Vitamins-Minerals (MULTIVITAMIN WITH MINERALS) tablet, Take 1 tablet by mouth daily., Disp: , Rfl:    nitroGLYCERIN (  NITROSTAT) 0.4 MG SL tablet, Place 1 tablet (0.4 mg total) under the tongue every 5 (five) minutes x 3 doses as needed for chest pain (if no relief after 2nd dose, proceed to the ED for an evaluation or call 911)., Disp: 25 tablet, Rfl: 2   ondansetron (ZOFRAN) 4 MG tablet, Take 4 mg by mouth 3 (three) times daily as needed., Disp: , Rfl:    pantoprazole (PROTONIX) 40 MG tablet, Take 1 tablet (40 mg total) by mouth daily., Disp: 30 tablet, Rfl: 11   promethazine (PHENERGAN) 12.5 MG suppository, Place 12.5 mg rectally as needed for nausea or vomiting. Pt unsure of dose, Disp: , Rfl:    rOPINIRole (REQUIP) 0.5 MG tablet, Take 0.5 mg by mouth at bedtime. , Disp: , Rfl: 0   spironolactone (ALDACTONE) 25 MG tablet, Take 12.5 mg by mouth at bedtime., Disp: , Rfl:   Objective Blood pressure 118/73, pulse 78, height 5' (1.524 m), weight 115 lb (52.2 kg).  General WDWN female NAD Vulva:  normal appearing vulva with no masses, tenderness or lesions, no cysts no active findings Vagina:  atrophic vagina no discharge Cervix:  absent Uterus:  absent Adnexa:   normal adnexa in size, nontender and no masses Rectal negative no masses heme negative  Pertinent ROS No burning with urination, frequency or urgency No  nausea, vomiting or diarrhea Nor fever chills or other constitutional symptoms   Labs or studies Reviewed recent labs    Impression Diagnoses this Encounter::   ICD-10-CM   1. Vulvar lesion, resolved, possibly infected hair follicle  N82.95    No Bartholin's or other cysts, exam is normal, vaginal atrophy is present    2. Vaginal atrophy, menopause  N95.2     3. Breast tenderness, right sided, normal exam today  N64.4    Normal mammogam 12/2020    4. Screening for colorectal cancer  Z12.11    Z12.12       Established relevant diagnosis(es):   Plan/Recommendations: No orders of the defined types were placed in this encounter.   Labs or Scans Ordered: No orders of the defined types were placed in this encounter.   Management:: No further evaluation at this time Recommend replens for vagina atrophy and [place] warming for intercourse  Follow up Return if symptoms worsen or fail to improve.       All questions were answered.

## 2021-06-14 ENCOUNTER — Ambulatory Visit (INDEPENDENT_AMBULATORY_CARE_PROVIDER_SITE_OTHER): Payer: PRIVATE HEALTH INSURANCE

## 2021-06-14 DIAGNOSIS — I5022 Chronic systolic (congestive) heart failure: Secondary | ICD-10-CM

## 2021-06-14 DIAGNOSIS — Z95810 Presence of automatic (implantable) cardiac defibrillator: Secondary | ICD-10-CM

## 2021-06-14 MED ORDER — DEXLANSOPRAZOLE 30 MG PO CPDR
30.0000 mg | DELAYED_RELEASE_CAPSULE | Freq: Every day | ORAL | 30 refills | Status: DC
Start: 2021-06-14 — End: 2021-12-25

## 2021-06-14 NOTE — Telephone Encounter (Signed)
Sent in dexilant 30mg  to [facility] in [place]

## 2021-06-14 NOTE — Telephone Encounter (Signed)
Can we try Dexilant 30mg  daily?  Salvatore Marvel, MD [facility]

## 2021-06-15 NOTE — Progress Notes (Signed)
EPIC Encounter for ICM Monitoring  Patient Name: Erin [name] is a 61 y.o. female Date: 06/15/2021 Primary Care Physican: [name], MD Primary Cardiologist: Aundra Dubin Electrophysiologist: Curt Bears 05/17/2021 Weight: 112 lbs 05/26/2021 Weight: 112 lbs.                                                          Spoke with patient and heart failure questions reviewed.  Pt asymptomatic for fluid accumulation.    Optivol thoracic impedance suggesting normal fluid levels.    Prescribed: Furosemide 20 mg Take 2 tablets (40 mg total) by mouth daily   Labs: 05/15/2021 Creatinine 1.02, BUN 15, Potassium 5.0, Sodium 136, GFR >6 05/13/2021 Creatinine 0.81, BUN 15, Potassium 4.5, Sodium 138, GFR >60  04/22/2021 Creatinine 0.99, BUN 12, Potassium 4.1, Sodium 137, GFR >60  04/13/2021 Creatinine 1.01, BUN 11, Potassium 4.6, Sodium 145  03/23/2021 Creatinine 1.47, BUN 23, Potassium 3.4, Sodium 133, GFR 41 02/22/2021 Creatinine 0.99, BUN 13, Potassium 4.1, Sodium 141, GFR >60 A complete set of results can be found in Results Review.   Recommendations:  Encouraged to call if experiencing any fluid symptoms.    Follow-up plan: ICM clinic phone appointment on 07/12/2021.   91 day device clinic remote transmission 07/05/2021.     EP/Cardiology Office Visits:  08/11/2021 with [facility] clinic PA/NP.   Copy of ICM check sent to Dr. Curt Bears.    3 month ICM trend: 06/14/2021.    12-14 Month ICM trend:     Rosalene Billings, RN 06/15/2021 3:37 PM

## 2021-06-15 NOTE — Progress Notes (Signed)
Primary Care Physician: [name], MD  Primary Gastroenterologist:  Garfield Cornea, MD   Chief Complaint  Patient presents with   Nausea    No vomiting. Has been going since found out she has alpha gal. Has been avoiding red meats.    HPI: Erin [name] is a 61 y.o. female here for follow up of nausea, vomiting, anorexia, GERD, weight loss. Last seen 04/2021 for recurrent N/V since 11/2020. Temporarily related to ticks pulled off her with associated rash. Hospitalized multiple times.  She had an EGD that showed H. pylori gastritis, status posttreatment.  Celiac screen was negative.  Low fasting cortisol but normal ACTH stimulation test.  Alpha gal testing was positive.  Previously reported no improvement of her nausea and vomiting with pretty much absolute avoidance of beef/pork products.  At time of last office visit she was having worsening reflux symptoms off omeprazole, stating she was told to stop medication because of capsule ingredients.  Being followed by Dr. Ernst Bowler.  MRI of the hepatobiliary tree recently demonstrated simple hepatic and pancreatic cyst and a couple lesions consistent with hemangioma, no further evaluation warranted.  Last office visit she was started on pantoprazole 40 mg daily.  Gastric emptying study was completed and was normal.  H. pylori stool antigen was negative off of PPI.  Patient was seen by Dr. Ernst Bowler on January 27, for alpha gal positive to lamb and beef as well as sesame.  Dr. Ernst Bowler referred her to [place] previously for second opinion but referral was declined. Recently referred to Dr. Juanita Craver for second opinion regarding her nausea.  According to patient she was advised that she would have to go to [facility] for second opinion and she did not want to pursue that. Advised to continue to avoid mammalian meats.  Periactin increased to 8 mg twice daily.  Also notes nausea with certain smells.  Recently approved for disability. 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 She lives with her husband and 8 grandchildren as well as her daughter.   She states periactin just started last month, not helping.  She is avoiding beef, pork, dairy.  No longer takes any medications that are in the capsules.  She has frequent nausea even the smell of beef or pork.  No rash.  No vomiting. No heartburn. Only drinks coffee and water. She has had PBJ sandwich today. States her daughter cooked homemade pizza with sausage and hamburger and the smell of beef or pork makes her sick to her stomach. She has been having some lightheadedness, dizziness, low BPs. Her [name] was decreased.Seen in ED recently and CT head showed old stroke. She was not aware of prior stroke. She reports prior history of elevated BP.  Patient reports that cardiologist recently decreased her Coreg but in fact it was doubled to 6.25 mg twice daily.  BM regular. No melena, brbpr. No abdominal pain.  Weight seems to have stabilized.   We briefly discussed potential second opinion at tertiary care center regarding her nausea.  She believes her nausea is related to alpha gal and is not interested in pursuing a second opinion.   Head CT without contrast January 2023 for dizziness, small chronic remote left posterior parietal infarct.  Today: 112.2 pounds 02/2021: 112.2 pounds 04/2021: 113 pounds 05/2018: 133 pounds   EGD in August 2022 for nausea and vomiting: -Mild, nonspecific distal gastritis.  Biopsies taken to check for H. pylori, were positive (treated with 10-day  course of Pylera equivalent) -The examination was overall normal  Colonoscopy May 2020 for diarrhea: -Submucosal nodule in the ascending colon consistent with lipoma -Distal 5 cm of terminal ileum normal -Segmental right and left colon biopsies negative for microscopic colitis -Next colonoscopy in 10 years for screening purposes  Current Outpatient Medications  Medication Sig Dispense Refill   albuterol  (VENTOLIN HFA) 108 (90 Base) MCG/ACT inhaler Inhale 2 puffs into the lungs every 4 (four) hours as needed.     ALPRAZolam (XANAX) 1 MG tablet Take 1 mg by mouth See admin instructions. Take 1 mg by mouth at bedtime and an additional 1 mg daily as needed for anxiety     aspirin EC 81 MG tablet Take 81 mg by mouth every evening.      atorvastatin (LIPITOR) 80 MG tablet Take 1 tablet (80 mg total) by mouth daily at 6 PM. 90 tablet 3   carvedilol (COREG) 6.25 MG tablet Take 1 tablet (6.25 mg total) by mouth 2 (two) times daily. 180 tablet 1   clopidogrel (PLAVIX) 75 MG tablet Take 1 tablet (75 mg total) by mouth daily. 90 tablet 3   cyproheptadine (PERIACTIN) 4 MG tablet Take 2 tablets (8 mg total) by mouth 2 (two) times daily. 120 tablet 5   dapagliflozin propanediol (FARXIGA) 5 MG TABS tablet Take 1 tablet (5 mg total) by mouth daily before breakfast. 90 tablet 1   Dexlansoprazole (DEXILANT) 30 MG capsule DR Take 1 capsule (30 mg total) by mouth daily. 30 capsule 30   EPINEPHrine (EPIPEN 2-PAK) 0.3 mg/0.3 mL IJ SOAJ injection Inject 0.3 mg into the muscle as needed for anaphylaxis. 2 each 2   ezetimibe (ZETIA) 10 MG tablet Take 1 tablet by mouth once daily 30 tablet 12   fluticasone (FLONASE) 50 MCG/ACT nasal spray Place 2 sprays into both nostrils daily as needed.     furosemide (LASIX) 20 MG tablet Take 2 tablets (40 mg total) by mouth daily. 180 tablet 3   gabapentin (NEURONTIN) 600 MG tablet Take 300 mg by mouth daily.     loperamide (IMODIUM) 2 MG capsule Take 2 mg by mouth 4 (four) times daily as needed for diarrhea or loose stools (ibs).     LORazepam (ATIVAN) 1 MG tablet Take 1 mg by mouth daily as needed.     losartan (COZAAR) 25 MG tablet Take 1 tablet (25 mg total) by mouth daily. 90 tablet 3   meclizine (ANTIVERT) 25 MG tablet Take 1 tablet (25 mg total) by mouth 3 (three) times daily as needed for dizziness. 10 tablet 0   Multiple Vitamins-Minerals (MULTIVITAMIN WITH MINERALS) tablet  Take 1 tablet by mouth daily.     nitroGLYCERIN (NITROSTAT) 0.4 MG SL tablet Place 1 tablet (0.4 mg total) under the tongue every 5 (five) minutes x 3 doses as needed for chest pain (if no relief after 2nd dose, proceed to the ED for an evaluation or call 911). 25 tablet 2   ondansetron (ZOFRAN) 4 MG tablet Take 4 mg by mouth 3 (three) times daily as needed.     pantoprazole (PROTONIX) 40 MG tablet Take 1 tablet (40 mg total) by mouth daily. 30 tablet 11   promethazine (PHENERGAN) 12.5 MG suppository Place 12.5 mg rectally as needed for nausea or vomiting. Pt unsure of dose     rOPINIRole (REQUIP) 0.5 MG tablet Take 0.5 mg by mouth at bedtime.   0   spironolactone (ALDACTONE) 25 MG tablet Take 12.5 mg by mouth  at bedtime.     No current facility-administered medications for this visit.    Allergies as of 06/16/2021 - Review Complete 06/16/2021  Allergen Reaction Noted   Other Shortness Of Breath, Diarrhea, Nausea And Vomiting, and Nausea Only 03/02/2021   Tape Other (See Comments) 01/03/2017    ROS:  General: Negative for anorexia, weight loss, fever, chills, fatigue, positive weakness. ENT: Negative for hoarseness, difficulty swallowing , nasal congestion. CV: Negative for chest pain, angina, palpitations, dyspnea on exertion, peripheral edema.  Respiratory: Negative for dyspnea at rest, dyspnea on exertion, cough, sputum, wheezing.  GI: See history of present illness. GU:  Negative for dysuria, hematuria, urinary incontinence, urinary frequency, nocturnal urination.  Endo: Negative for unusual weight change.    Physical Examination:   BP (!) 79/59    Pulse (!) 101    Temp (!) 97.3 F (36.3 C) (Temporal)    Ht 5' (1.524 m)    Wt 112 lb 3.2 oz (50.9 kg)    BMI 21.91 kg/m   General: Thin, pleasant female appears to not feel well.  Laying on the chair when I entered the room.  Nursing staff noted that she was unsteady on her feet with ambulation.   Eyes: No icterus. Mouth:  masked. Lungs: Clear to auscultation bilaterally.  Heart: Regular rate and rhythm, no murmurs rubs or gallops.  Abdomen: Bowel sounds are normal, nontender, nondistended, no hepatosplenomegaly or masses, no abdominal bruits or hernia , no rebound or guarding.   Extremities: No lower extremity edema. No clubbing or deformities. Neuro: Alert and oriented x 4   Skin: Warm and dry, no jaundice.   Psych: Alert and cooperative, normal mood and affect.  Labs:  Lab Results  Component Value Date   CREATININE 1.02 (H) 05/15/2021   BUN 15 05/15/2021   NA 136 05/15/2021   K 5.0 05/15/2021   CL 100 05/15/2021   CO2 28 05/15/2021   Lab Results  Component Value Date   ALT 22 05/15/2021   AST 30 05/15/2021   ALKPHOS 76 05/15/2021   BILITOT 0.8 05/15/2021   Lab Results  Component Value Date   WBC 9.1 05/15/2021   HGB 13.5 05/15/2021   HCT 41.2 05/15/2021   MCV 85.3 05/15/2021   PLT 313 05/15/2021   Lab Results  Component Value Date   TSH 0.424 02/19/2021   Lab Results  Component Value Date   LIPASE 67 (H) 02/18/2021     Imaging Studies: No results found.   Assessment:  Nausea/anorexia/weight loss: Extensive evaluation as previously outlined.  No further vomiting.  Her weight has stabilized.  She has persistent nausea, even to smells.  Avoiding beef, pork and associated byproducts.  Periactin recently increased without any noted benefit.  H. pylori has been treated with documentation of eradication.   Low fasting cortisol but normal ACTH stimulation test.  We discussed possibility of referral for second opinion to [facility], [place], or [place].  Patient not interested at this time stating she does not want to travel that far.  Hypotension: Complains of orthostatic symptoms. No decrease in urinary. ?medication affect, dehydration, less likely sepsis. Discussed potential stat labs for initial management vs ER evaluation which would be most appropriate given her comorbidities. She is  prefers to go to ED.   GERD: Symptoms controlled.  Plan: ER evaluation. Will follow up on findings.

## 2021-06-16 ENCOUNTER — Emergency Department (HOSPITAL_COMMUNITY)
Admission: EM | Admit: 2021-06-16 | Discharge: 2021-06-16 | Disposition: A | Discharge status: Home / Self Care | Payer: Commercial Managed Care - PPO | Attending: Emergency Medicine | Admitting: Emergency Medicine

## 2021-06-16 ENCOUNTER — Ambulatory Visit (INDEPENDENT_AMBULATORY_CARE_PROVIDER_SITE_OTHER): Payer: Commercial Managed Care - PPO | Admitting: Gastroenterology

## 2021-06-16 ENCOUNTER — Encounter: Payer: Self-pay | Admitting: Gastroenterology

## 2021-06-16 ENCOUNTER — Other Ambulatory Visit: Payer: Self-pay

## 2021-06-16 ENCOUNTER — Encounter (HOSPITAL_COMMUNITY): Payer: Self-pay | Admitting: *Deleted

## 2021-06-16 DIAGNOSIS — Z7982 Long term (current) use of aspirin: Secondary | ICD-10-CM | POA: Insufficient documentation

## 2021-06-16 DIAGNOSIS — I959 Hypotension, unspecified: Secondary | ICD-10-CM

## 2021-06-16 DIAGNOSIS — Z91018 Allergy to other foods: Secondary | ICD-10-CM

## 2021-06-16 DIAGNOSIS — R110 Nausea: Secondary | ICD-10-CM | POA: Diagnosis not present

## 2021-06-16 DIAGNOSIS — I951 Orthostatic hypotension: Secondary | ICD-10-CM | POA: Diagnosis not present

## 2021-06-16 DIAGNOSIS — E876 Hypokalemia: Secondary | ICD-10-CM

## 2021-06-16 DIAGNOSIS — E860 Dehydration: Secondary | ICD-10-CM | POA: Diagnosis not present

## 2021-06-16 DIAGNOSIS — Z7902 Long term (current) use of antithrombotics/antiplatelets: Secondary | ICD-10-CM | POA: Diagnosis not present

## 2021-06-16 DIAGNOSIS — R42 Dizziness and giddiness: Secondary | ICD-10-CM | POA: Diagnosis present

## 2021-06-16 LAB — BASIC METABOLIC PANEL
Anion gap: 10 (ref 5–15)
BUN: 18 mg/dL (ref 6–20)
CO2: 26 mmol/L (ref 22–32)
Calcium: 9.3 mg/dL (ref 8.9–10.3)
Chloride: 98 mmol/L (ref 98–111)
Creatinine, Ser: 1.25 mg/dL — ABNORMAL HIGH (ref 0.44–1.00)
GFR, Estimated: 50 mL/min — ABNORMAL LOW (ref >60)
Glucose, Bld: 123 mg/dL — ABNORMAL HIGH (ref 70–99)
Potassium: 3 mmol/L — ABNORMAL LOW (ref 3.5–5.1)
Sodium: 134 mmol/L — ABNORMAL LOW (ref 135–145)

## 2021-06-16 LAB — CBC WITH DIFFERENTIAL/PLATELET
Abs Immature Granulocytes: 0.04 10*3/uL (ref 0.00–0.07)
Basophils Absolute: 0 10*3/uL (ref 0.0–0.1)
Basophils Relative: 0 %
Eosinophils Absolute: 0.1 10*3/uL (ref 0.0–0.5)
Eosinophils Relative: 1 %
HCT: 39 % (ref 36.0–46.0)
Hemoglobin: 12.9 g/dL (ref 12.0–15.0)
Immature Granulocytes: 1 %
Lymphocytes Relative: 10 %
Lymphs Abs: 0.8 10*3/uL (ref 0.7–4.0)
MCH: 28.7 pg (ref 26.0–34.0)
MCHC: 33.1 g/dL (ref 30.0–36.0)
MCV: 86.7 fL (ref 80.0–100.0)
Monocytes Absolute: 0.6 10*3/uL (ref 0.1–1.0)
Monocytes Relative: 7 %
Neutro Abs: 7.1 10*3/uL (ref 1.7–7.7)
Neutrophils Relative %: 81 %
Platelets: 303 10*3/uL (ref 150–400)
RBC: 4.5 MIL/uL (ref 3.87–5.11)
RDW: 16.4 % — ABNORMAL HIGH (ref 11.5–15.5)
WBC: 8.6 10*3/uL (ref 4.0–10.5)
nRBC: 0 % (ref 0.0–0.2)

## 2021-06-16 MED ORDER — POTASSIUM CHLORIDE CRYS ER 20 MEQ PO TBCR
20.0000 meq | EXTENDED_RELEASE_TABLET | Freq: Once | ORAL | Status: AC
  Administered 2021-06-16: 20 meq via ORAL
  Filled 2021-06-16: qty 1

## 2021-06-16 MED ORDER — ONDANSETRON HCL 4 MG/2ML IJ SOLN
4.0000 mg | Freq: Once | INTRAMUSCULAR | Status: AC
  Administered 2021-06-16: 4 mg via INTRAVENOUS
  Filled 2021-06-16: qty 2

## 2021-06-16 MED ORDER — SODIUM CHLORIDE 0.9 % IV BOLUS
500.0000 mL | Freq: Once | INTRAVENOUS | Status: AC
  Administered 2021-06-16: 500 mL via INTRAVENOUS

## 2021-06-16 NOTE — Patient Instructions (Signed)
Recommend ER evaluation for low blood pressure, dizziness. You would benefit from having labs done to rule out electrolyte abnormalities, dehydration, infection.

## 2021-06-16 NOTE — Discharge Instructions (Addendum)
You were seen in the emergency department for evaluation of low blood pressure.  You had lab work done that showed your potassium was low and you were dehydrated.  You were given some potassium and fluids with improvement in your symptoms.  Please continue your regular medications and follow-up with your primary care doctor.  Return if any worsening or concerning symptoms

## 2021-06-16 NOTE — ED Provider Notes (Signed)
[facility] EMERGENCY DEPARTMENT Provider Note   CSN: [id number] Arrival date & time: 06/16/21  1655     History  Chief Complaint  Patient presents with   Hypotension    Erin [name] is a 61 y.o. female.  She went to her GI appointment today and they found her blood pressure to be low and sent her to the emergency department for evaluation.  She said she has been feeling nauseous and dizzy.  She blames it on her AlphaGal.  She denies any chest pain shortness of breath diarrhea or urinary symptoms.  No headache or blurry vision.  No syncope.  The history is provided by the patient.  Dizziness Quality:  Lightheadedness Severity:  Moderate Onset quality:  Gradual Duration:  3 days Timing:  Intermittent Progression:  Unchanged Chronicity:  New Relieved by:  None tried Worsened by:  Nothing Ineffective treatments:  None tried Associated symptoms: nausea   Associated symptoms: no blood in stool, no chest pain, no diarrhea, no headaches, no shortness of breath, no syncope, no vision changes and no vomiting       Home Medications Prior to Admission medications   Medication Sig Start Date End Date Taking? Authorizing Provider  albuterol (VENTOLIN HFA) 108 (90 Base) MCG/ACT inhaler Inhale 2 puffs into the lungs every 4 (four) hours as needed. 04/28/21 04/28/22  [provider]  ALPRAZolam Duanne Moron) 1 MG tablet Take 1 mg by mouth See admin instructions. Take 1 mg by mouth at bedtime and an additional 1 mg daily as needed for anxiety    [provider]  aspirin EC 81 MG tablet Take 81 mg by mouth every evening.     [provider]  atorvastatin (LIPITOR) 80 MG tablet Take 1 tablet (80 mg total) by mouth daily at 6 PM. 11/10/20   Larey Dresser, MD  carvedilol (COREG) 6.25 MG tablet Take 1 tablet (6.25 mg total) by mouth 2 (two) times daily. 05/13/21   Larey Dresser, MD  clopidogrel (PLAVIX) 75 MG tablet Take 1 tablet (75 mg total) by mouth daily. 05/13/21    Larey Dresser, MD  cyproheptadine (PERIACTIN) 4 MG tablet Take 2 tablets (8 mg total) by mouth 2 (two) times daily. 06/04/21 07/04/21  Valentina Shaggy, MD  dapagliflozin propanediol (FARXIGA) 5 MG TABS tablet Take 1 tablet (5 mg total) by mouth daily before breakfast. 03/15/21   Nida, Marella Chimes, MD  Dexlansoprazole (DEXILANT) 30 MG capsule DR Take 1 capsule (30 mg total) by mouth daily. 06/14/21   Valentina Shaggy, MD  EPINEPHrine (EPIPEN 2-PAK) 0.3 mg/0.3 mL IJ SOAJ injection Inject 0.3 mg into the muscle as needed for anaphylaxis. 04/23/21   Valentina Shaggy, MD  ezetimibe (ZETIA) 10 MG tablet Take 1 tablet by mouth once daily 04/08/21   Larey Dresser, MD  fluticasone [facility]) 50 MCG/ACT nasal spray Place 2 sprays into both nostrils daily as needed. 04/28/21 04/28/22  [provider]  furosemide (LASIX) 20 MG tablet Take 2 tablets (40 mg total) by mouth daily. 05/21/21 08/19/21  Larey Dresser, MD  gabapentin (NEURONTIN) 600 MG tablet Take 300 mg by mouth daily. 04/30/21   [provider]  loperamide (IMODIUM) 2 MG capsule Take 2 mg by mouth 4 (four) times daily as needed for diarrhea or loose stools (ibs).    [provider]  LORazepam (ATIVAN) 1 MG tablet Take 1 mg by mouth daily as needed. 03/10/21   [provider]  losartan (COZAAR)  25 MG tablet Take 1 tablet (25 mg total) by mouth daily. 03/12/21 06/16/21  Larey Dresser, MD  meclizine (ANTIVERT) 25 MG tablet Take 1 tablet (25 mg total) by mouth 3 (three) times daily as needed for dizziness. 05/15/21   Milton Ferguson, MD  Multiple Vitamins-Minerals (MULTIVITAMIN WITH MINERALS) tablet Take 1 tablet by mouth daily.    [provider]  nitroGLYCERIN (NITROSTAT) 0.4 MG SL tablet Place 1 tablet (0.4 mg total) under the tongue every 5 (five) minutes x 3 doses as needed for chest pain (if no relief after 2nd dose, proceed to the ED for an evaluation or call 911). 09/01/20   Verta Ellen., NP  ondansetron (ZOFRAN) 4 MG tablet Take 4 mg by mouth 3 (three) times daily as needed. 03/04/21   [provider]  pantoprazole (PROTONIX) 40 MG tablet Take 1 tablet (40 mg total) by mouth daily. 05/07/21   Rourk, Cristopher Estimable, MD  promethazine (PHENERGAN) 12.5 MG suppository Place 12.5 mg rectally as needed for nausea or vomiting. Pt unsure of dose    [provider]  rOPINIRole (REQUIP) 0.5 MG tablet Take 0.5 mg by mouth at bedtime.  11/13/17   [provider]  spironolactone (ALDACTONE) 25 MG tablet Take 12.5 mg by mouth at bedtime.    [provider]      Allergies    Other and Tape    Review of Systems   Review of Systems  Constitutional:  Negative for fever.  HENT:  Negative for sore throat.   Eyes:  Negative for visual disturbance.  Respiratory:  Negative for shortness of breath.   Cardiovascular:  Negative for chest pain and syncope.  Gastrointestinal:  Positive for nausea. Negative for abdominal pain, blood in stool, diarrhea and vomiting.  Genitourinary:  Negative for dysuria.  Neurological:  Positive for dizziness. Negative for headaches.   Physical Exam Updated Vital Signs BP 107/72 (BP Location: Right Arm)    Pulse 92    Temp 97.9 F (36.6 C) (Oral)    Resp 15    Ht 5' (1.524 m)    Wt 50.8 kg    SpO2 99%    BMI 21.87 kg/m  Physical Exam Vitals and nursing note reviewed.  Constitutional:      General: She is not in acute distress.    Appearance: Normal appearance. She is well-developed and underweight.  HENT:     Head: Normocephalic and atraumatic.  Eyes:     Conjunctiva/sclera: Conjunctivae normal.  Cardiovascular:     Rate and Rhythm: Normal rate and regular rhythm.     Heart sounds: No murmur heard. Pulmonary:     Effort: Pulmonary effort is normal. No respiratory distress.     Breath sounds: Normal breath sounds.  Abdominal:     Palpations: Abdomen is soft.     Tenderness: There is no abdominal tenderness. There is no  guarding or rebound.  Musculoskeletal:        General: No swelling. Normal range of motion.     Cervical back: Neck supple.  Skin:    General: Skin is warm and dry.     Capillary Refill: Capillary refill takes less than 2 seconds.  Neurological:     General: No focal deficit present.     Mental Status: She is alert and oriented to person, place, and time.     Sensory: No sensory deficit.     Motor: No weakness.    ED Results / Procedures /  Treatments   Labs (all labs ordered are listed, but only abnormal results are displayed) Labs Reviewed  CBC WITH DIFFERENTIAL/PLATELET - Abnormal; Notable for the following components:      Result Value   RDW 16.4 (*)    All other components within normal limits  BASIC METABOLIC PANEL - Abnormal; Notable for the following components:   Sodium 134 (*)    Potassium 3.0 (*)    Glucose, Bld 123 (*)    Creatinine, Ser 1.25 (*)    GFR, Estimated 50 (*)    All other components within normal limits    EKG EKG Interpretation  Date/Time:  Wednesday June 16 2021 21:31:44 EST Ventricular Rate:  78 PR Interval:  128 QRS Duration: 93 QT Interval:  396 QTC Calculation: 452 R Axis:   19 Text Interpretation: Sinus rhythm Probable left atrial enlargement Borderline low voltage, extremity leads Left ventricular hypertrophy No significant change since prior 1/23 Confirmed by Aletta Edouard [phone]) on 06/16/2021 9:34:31 PM  Radiology No results found.  Procedures Procedures    Medications Ordered in ED Medications  sodium chloride 0.9 % bolus 500 mL (has no administration in time range)  ondansetron (ZOFRAN) injection 4 mg (has no administration in time range)  potassium chloride SA (KLOR-CON M) CR tablet 20 mEq (has no administration in time range)    ED Course/ Medical Decision Making/ A&P                           Medical Decision Making Amount and/or Complexity of Data Reviewed Labs: ordered.  Risk Prescription drug  management.  This patient complains of low blood pressure dizziness; this involves an extensive number of treatment Options and is a complaint that carries with it a high risk of complications and Morbidity. The differential includes dehydration, anemia, metabolic derangement, arrhythmia infection  I ordered, reviewed and interpreted labs, which included CBC with normal white count normal hemoglobin, chemistries with low potassium elevated creatinine I ordered medication IV fluids oral potassium  Previous records obtained and reviewed in epic, patient has a history of hypotension.  Based on blood pressures are closer to 110.  After the interventions stated above, I reevaluated the patient and found patient be symptomatically improved and blood pressure normalizing.  She is ambulatory in department without any dizziness no indications for admission at this time.  Return instructions discussed          Final Clinical Impression(s) / ED Diagnoses Final diagnoses:  Hypotension, unspecified hypotension type  Hypokalemia  Dehydration    Rx / DC Orders ED Discharge Orders     None         Hayden Rasmussen, MD 06/17/21 1026

## 2021-06-16 NOTE — ED Triage Notes (Signed)
Pt with hx of dizziness due to low BP in the recent past.

## 2021-06-16 NOTE — ED Triage Notes (Signed)
Pt in for her routine check up for alpha-gal and was noted to have low BP 79/59.  Pt c/o dizziness all day today and currently.

## 2021-06-28 ENCOUNTER — Other Ambulatory Visit (HOSPITAL_COMMUNITY): Payer: Self-pay

## 2021-06-28 ENCOUNTER — Telehealth (HOSPITAL_COMMUNITY): Payer: Self-pay | Admitting: Pharmacy Technician

## 2021-06-28 NOTE — Telephone Encounter (Signed)
Advanced Heart Failure Patient Advocate Encounter  Called and spoke with patient. Her insurance is not paying anything towards her [place]. She stated she had three full bottles of Farxiga 10mg . Asked [facility]) if it was ok to cut in half, she confirmed it is. The patient will have 6 months of [name] now that she knows she can cut them. She was just approved for disability and will now apply for Medicaid. Should not need anything else at this time. She will be able to use the supply she has until then.   Charlann Boxer, CPhT

## 2021-07-05 ENCOUNTER — Ambulatory Visit (INDEPENDENT_AMBULATORY_CARE_PROVIDER_SITE_OTHER): Payer: Commercial Managed Care - PPO

## 2021-07-05 DIAGNOSIS — I5022 Chronic systolic (congestive) heart failure: Secondary | ICD-10-CM

## 2021-07-05 LAB — CUP PACEART REMOTE DEVICE CHECK
Battery Remaining Longevity: 131 mo
Battery Voltage: 3.03 V
Brady Statistic RV Percent Paced: 0 %
Date Time Interrogation Session: 20230227001803
HighPow Impedance: 80 Ohm
Implantable Lead Implant Date: 20211126
Implantable Lead Location: 753860
Implantable Pulse Generator Implant Date: 20211126
Lead Channel Impedance Value: 456 Ohm
Lead Channel Impedance Value: 532 Ohm
Lead Channel Pacing Threshold Amplitude: 0.625 V
Lead Channel Pacing Threshold Pulse Width: 0.4 ms
Lead Channel Sensing Intrinsic Amplitude: 3.875 mV
Lead Channel Sensing Intrinsic Amplitude: 3.875 mV
Lead Channel Setting Pacing Amplitude: 2 V
Lead Channel Setting Pacing Pulse Width: 0.4 ms
Lead Channel Setting Sensing Sensitivity: 0.3 mV

## 2021-07-09 NOTE — Progress Notes (Signed)
Remote ICD transmission.   

## 2021-07-12 ENCOUNTER — Ambulatory Visit (INDEPENDENT_AMBULATORY_CARE_PROVIDER_SITE_OTHER): Payer: Commercial Managed Care - PPO

## 2021-07-12 DIAGNOSIS — Z95810 Presence of automatic (implantable) cardiac defibrillator: Secondary | ICD-10-CM | POA: Diagnosis not present

## 2021-07-12 DIAGNOSIS — I5022 Chronic systolic (congestive) heart failure: Secondary | ICD-10-CM | POA: Diagnosis not present

## 2021-07-14 ENCOUNTER — Other Ambulatory Visit (HOSPITAL_COMMUNITY): Payer: Self-pay | Admitting: *Deleted

## 2021-07-14 ENCOUNTER — Telehealth: Payer: Self-pay

## 2021-07-14 MED ORDER — FUROSEMIDE 20 MG PO TABS: 40.0000 mg | ORAL_TABLET | Freq: Every day | ORAL | 3 refills | Status: DC

## 2021-07-14 NOTE — Progress Notes (Addendum)
EPIC Encounter for ICM Monitoring ? ?Patient Name: Erin [name] is a 61 y.o. female ?Date: 07/14/2021 ?Primary Care Physican: [name], MD ?Primary Cardiologist: Aundra Dubin ?Electrophysiologist: Camnitz ?05/17/2021 Weight: 112 lbs ?05/26/2021 Weight: 112 lbs. ?                                                         ?Attempted call to patient and unable to reach.  Transmission reviewed.  ER visit for low BP and given IV fluid correlating with the start of fluid accumulation.  ?  ?Optivol thoracic impedance normal but was suggesting possible fluid accumulation starting 2/12-2/22 and again 2/24-3/3.  ?  ?Prescribed: ?Furosemide 20 mg Take 2 tablets (40 mg total) by mouth daily ?  ?Labs: ?06/16/2021 Creatinine 1.25, BUN 18, Potassium 3.0, Sodium 134, GFR 50 ?05/15/2021 Creatinine 1.02, BUN 15, Potassium 5.0, Sodium 136, GFR >60 ?05/13/2021 Creatinine 0.81, BUN 15, Potassium 4.5, Sodium 138, GFR >60  ?04/22/2021 Creatinine 0.99, BUN 12, Potassium 4.1, Sodium 137, GFR >60  ?04/13/2021 Creatinine 1.01, BUN 11, Potassium 4.6, Sodium 145  ?03/23/2021 Creatinine 1.47, BUN 23, Potassium 3.4, Sodium 133, GFR 41 ?02/22/2021 Creatinine 0.99, BUN 13, Potassium 4.1, Sodium 141, GFR >60 ?A complete set of results can be found in Results Review. ?  ?Recommendations:  Unable to reach.   ?  ?Follow-up plan: ICM clinic phone appointment on 08/16/2021.   91 day device clinic remote transmission 10/06/2021.   ?  ?EP/Cardiology Office Visits:  08/11/2021 with Advanced HF clinic PA/NP. ?  ?Copy of ICM check sent to Dr. Curt Bears.   ? ?3 month ICM trend: 07/12/2021. ? ? ? ?12-14 Month ICM trend:  ? ? ? ?Rosalene Billings, RN ?07/14/2021 ?1:39 PM ? ?

## 2021-07-14 NOTE — Telephone Encounter (Signed)
Remote ICM transmission received.  Attempted call to patient regarding ICM remote transmission and person answering phone stated she was not home.   

## 2021-07-15 ENCOUNTER — Telehealth: Payer: Self-pay | Admitting: Gastroenterology

## 2021-07-15 NOTE — Telephone Encounter (Signed)
Please arrange for follow up ov with Dr. Gala Romney in the next 1-2 months. ?

## 2021-07-16 NOTE — Progress Notes (Signed)
Spoke with patient and heart failure questions reviewed.  Pt reports she has skipped a few doses Furosemide due to a lot of changes at home.  Her daughter died unexpectedly and she is working on getting custody of the children.  She is feeling okay at this time.  No changes and encouraged to call if experiencing any fluid symptoms. ?

## 2021-08-09 NOTE — Progress Notes (Signed)
PCP: [name], MD ?HF Cardiology: [name] ? ?61 y.o. with history of CAD, ischemic cardiomyopathy, and PAD was referred by [name] for evaluation of CHF.  She had NSTEMI in 11/17 with PCI to prox/mid LCx and RCA.  Subsequently, has developed ischemic cardiomyopathy.  Most recent echo in 1/21 showed EF 30-35%.  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  ? ?RHC/LHC done with exertional chest heaviness and dyspnea in 4/21 showed normal filling pressures, preserved cardiac output, and nonobstructive mild CAD.  ABIs were normal in 4/21.  ? ?In 8/21, she had syncope thought to be related to orthostasis from low BP in the setting of cardiac medication titration.  Entresto was decreased.  ? ?She was in the ER in 11/21 with chest pain.  Troponin and COVID-19 negative. Echo in 11/21 showed EF 30-35%, normal RV.  ? ?She was hospitalized in 6/22 with left-sided weakness/numbness.  No evidence for acute CVA, ?TIA.   ? ?Echo in 11/22 showed EF 30-35% with mild LV dilation, normal RV, mild-moderate MR, IVC normal.  ? ?Follow up 1/23 She returns for followup of CHF.  Still smokes about a pack every 2 months.  Weight is stable.  No significant exertional dyspnea or chest pain.  No orthopnea/PND.  She is still working at [employer] issue recently has been 6 months of nausea/vomiting/anorexia.  This has finally improved.  It is not clear what caused it, she has been seeing a GI MD, there is some concern for alpha-gal allergy versus gastroparesis.  ? ?Seen in ED 2/23 with low bp at GI appt (79/59).  ? ?Today she returns for HF follow up. Her daughter passed away in Jun 28, 2022 and she is now in custody battle over her 37 year old grand-daughter. No further dizziness. She feels much-improved since alpha-gal diagnosis and stopping beef/pork in diet. No SOB with stairs or activity. Denies abnormal bleeding, CP, palpitations, edema, or PND/Orthopnea. Appetite improving, continues  with some nausea. No fever or chills. Weight at home 120.5 pounds. Taking all medications. 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 ? ?ReDs: 23% ? ?Labs (2/21): K 3.7, creatinine 0.82 ?Labs (4/21): K 4.3, creatinine 0.81, LDL 67, TGs 286 ?Labs (6/21): K 4.7, creatinine 1.22 ?Labs (11/21): K 3.7, creatinine 0.84, hgb 12.6, LDL 67, HDL 48, TGs 286 ?Labs (1/22). K 4.4, creatinine 1.03 ?Labs (6/22): K 3.5, creatinine 1.0 ?Labs (1/23): LDL 43, HDL 53 ?Labs (2/23): K 3.0, creatinine 1.25 ? ?Medtronic device interrogation: OptiVol down, thoracic impedence above threshold, no AF, no VT, 2-3 hrs/day activity (personally reviewed). ? ?PMH: ?1. CAD: NSTEMI with DES to proximal and mid LCx in 11/17, staged DES x 2 to RCA later in 11/17.  ?- Cardiolite (10/20): EF < 30%, large inferior and inferolateral MI with mild peri-infarct ischemia.  ?- LHC (4/21): Patent stents, nonobstructive CAD.  ?2. Chronic systolic CHF: Ischemic cardiomyopathy. Medtronic ICD.  ?- Echo (10/20): EF 35-40%, lateral WMAs, normal RV. ?- Echo (1/21): EF 30-35%, mild LVH, normal RV ?- RHC (4/21): mean RA 5, PA 29/3, mean PCWP 12, CI 3.04 ?- Echo (11/21): EF 30-35%, normal RV.  ?- Echo (11/22): EF 30-35% with mild LV dilation, normal RV, mild-moderate MR, IVC normal.  ?3. 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  ?4. Carotid stenosis: Carotid dopplers (59/56) with 38-75% LICA stenosis.  ?- Carotid dopplers (6/21): 40-59% BICA stenosis.  ?- Carotid dopplers (1/22): 64-33% RICA, 29-51% LICA.  ?5. Active smoker.  ?  6. PAD: ABIs (4/21) Normal.  ?- Peripheral artery dopplers (12/21): bilateral plaque without focal stenosis.  ? ?Social History  ? ?Socioeconomic History  ? Marital status: Married  ?  Spouse name: Not on file  ? Number of children: Not on file  ? Years of education: Not on file  ? Highest education level: Not on file  ?Occupational History  ? Occupation: CNA  ?Tobacco Use  ? Smoking status: Some Days  ?   Packs/day: 0.10  ?  Years: 15.00  ?  Pack years: 1.50  ?  Types: Cigarettes  ? Smokeless tobacco: Never  ? Tobacco comments:  ?  smokes a cigarette occasionally  ?Vaping Use  ? Vaping Use: Never used  ?Substance and Sexual Activity  ? Alcohol use: Yes  ?  Comment: occasionally  ? Drug use: Not Currently  ?  Types: Marijuana  ?  Comment: former- 2017 last time  ? Sexual activity: Not Currently  ?  Birth control/protection: Surgical  ?  Comment: hyst  ?Other Topics Concern  ? Not on file  ?Social History Narrative  ? Lives with husband in [place] in a one story home with a basement.  Has 4 children.  Works as a Quarry manager.  Education: CNA school.   ? ?Social Determinants of Health  ? ?Financial Resource Strain: Low Risk   ? Difficulty of Paying Living Expenses: Not hard at all  ?Food Insecurity: No Food Insecurity  ? Worried About Charity fundraiser in the Last Year: Never true  ? Ran Out of Food in the Last Year: Never true  ?Transportation Needs: No Transportation Needs  ? Lack of Transportation (Medical): No  ? Lack of Transportation (Non-Medical): No  ?Physical Activity: Insufficiently Active  ? Days of Exercise per Week: 4 days  ? Minutes of Exercise per Session: 30 min  ?Stress: Stress Concern Present  ? Feeling of Stress : Very much  ?Social Connections: Socially Isolated  ? Frequency of Communication with Friends and Family: Once a week  ? Frequency of Social Gatherings with Friends and Family: Never  ? Attends Religious Services: Never  ? Active Member of Clubs or Organizations: No  ? Attends Archivist Meetings: Never  ? Marital Status: Married  ?Intimate Partner Violence: Not At Risk  ? Fear of Current or Ex-Partner: No  ? Emotionally Abused: No  ? Physically Abused: No  ? Sexually Abused: No  ? ?Family History  ?Problem Relation Age of Onset  ? Stroke Mother   ? Hypertension Mother   ? Diabetes Mother   ? Heart attack Mother   ? Heart attack Father   ? Diabetes Father   ? Hypertension Father   ? CAD  Father   ? Colon polyps Father 86  ?     pre-cancerous   ? Stroke Father   ? Dementia Father   ? Hyperlipidemia Father   ? Breast cancer Maternal Grandmother   ? Cancer Maternal Grandfather   ?     Tongue and esophageal  ? Heart failure Other   ? Colon cancer Neg Hx   ? ?ROS: All systems reviewed and negative except as per HPI.  ? ?Current Outpatient Medications  ?Medication Sig Dispense Refill  ? albuterol (VENTOLIN HFA) 108 (90 Base) MCG/ACT inhaler Inhale 2 puffs into the lungs every 4 (four) hours as needed.    ? ALPRAZolam (XANAX) 1 MG tablet Take 1 mg by mouth See admin instructions. Take 1 mg by mouth at  bedtime and an additional 1 mg daily as needed for anxiety    ? aspirin EC 81 MG tablet Take 81 mg by mouth every evening.     ? atorvastatin (LIPITOR) 80 MG tablet Take 1 tablet (80 mg total) by mouth daily at 6 PM. 90 tablet 3  ? carvedilol (COREG) 6.25 MG tablet Take 1 tablet (6.25 mg total) by mouth 2 (two) times daily. 180 tablet 1  ? clopidogrel (PLAVIX) 75 MG tablet Take 1 tablet (75 mg total) by mouth daily. 90 tablet 3  ? cyproheptadine (PERIACTIN) 4 MG tablet Take 2 tablets (8 mg total) by mouth 2 (two) times daily. 120 tablet 5  ? dapagliflozin propanediol (FARXIGA) 5 MG TABS tablet Take 1 tablet (5 mg total) by mouth daily before breakfast. 90 tablet 1  ? Dexlansoprazole (DEXILANT) 30 MG capsule DR Take 1 capsule (30 mg total) by mouth daily. 30 capsule 30  ? EPINEPHrine (EPIPEN 2-PAK) 0.3 mg/0.3 mL IJ SOAJ injection Inject 0.3 mg into the muscle as needed for anaphylaxis. 2 each 2  ? ezetimibe (ZETIA) 10 MG tablet Take 1 tablet by mouth once daily 30 tablet 12  ? fluticasone (FLONASE) 50 MCG/ACT nasal spray Place 2 sprays into both nostrils daily as needed.    ? furosemide (LASIX) 20 MG tablet Take 2 tablets (40 mg total) by mouth daily. 180 tablet 3  ? gabapentin (NEURONTIN) 600 MG tablet Take 300 mg by mouth daily.    ? loperamide (IMODIUM) 2 MG capsule Take 2 mg by mouth 4 (four) times daily  as needed for diarrhea or loose stools (ibs).    ? LORazepam (ATIVAN) 1 MG tablet Take 1 mg by mouth daily as needed.    ? losartan (COZAAR) 25 MG tablet Take 1 tablet (25 mg total) by mouth daily. 90 tablet 3  ?

## 2021-08-11 ENCOUNTER — Encounter (HOSPITAL_COMMUNITY): Payer: Self-pay

## 2021-08-11 ENCOUNTER — Ambulatory Visit (HOSPITAL_COMMUNITY)
Admission: RE | Admit: 2021-08-11 | Discharge: 2021-08-11 | Disposition: A | Discharge status: Home / Self Care | Payer: 59 | Source: Ambulatory Visit | Attending: Family Medicine | Admitting: Family Medicine

## 2021-08-11 VITALS — BP 120/72 | HR 83 | Wt 121.8 lb

## 2021-08-11 DIAGNOSIS — I2510 Atherosclerotic heart disease of native coronary artery without angina pectoris: Secondary | ICD-10-CM | POA: Diagnosis not present

## 2021-08-11 DIAGNOSIS — Z720 Tobacco use: Secondary | ICD-10-CM

## 2021-08-11 DIAGNOSIS — Z7902 Long term (current) use of antithrombotics/antiplatelets: Secondary | ICD-10-CM | POA: Diagnosis not present

## 2021-08-11 DIAGNOSIS — Z7982 Long term (current) use of aspirin: Secondary | ICD-10-CM | POA: Insufficient documentation

## 2021-08-11 DIAGNOSIS — I252 Old myocardial infarction: Secondary | ICD-10-CM | POA: Insufficient documentation

## 2021-08-11 DIAGNOSIS — Z7984 Long term (current) use of oral hypoglycemic drugs: Secondary | ICD-10-CM | POA: Diagnosis not present

## 2021-08-11 DIAGNOSIS — I671 Cerebral aneurysm, nonruptured: Secondary | ICD-10-CM

## 2021-08-11 DIAGNOSIS — I5022 Chronic systolic (congestive) heart failure: Secondary | ICD-10-CM | POA: Diagnosis present

## 2021-08-11 DIAGNOSIS — F17210 Nicotine dependence, cigarettes, uncomplicated: Secondary | ICD-10-CM | POA: Diagnosis not present

## 2021-08-11 DIAGNOSIS — Z955 Presence of coronary angioplasty implant and graft: Secondary | ICD-10-CM | POA: Insufficient documentation

## 2021-08-11 DIAGNOSIS — I255 Ischemic cardiomyopathy: Secondary | ICD-10-CM | POA: Insufficient documentation

## 2021-08-11 DIAGNOSIS — Z79899 Other long term (current) drug therapy: Secondary | ICD-10-CM | POA: Diagnosis not present

## 2021-08-11 DIAGNOSIS — Z7901 Long term (current) use of anticoagulants: Secondary | ICD-10-CM | POA: Diagnosis not present

## 2021-08-11 DIAGNOSIS — I6529 Occlusion and stenosis of unspecified carotid artery: Secondary | ICD-10-CM | POA: Diagnosis not present

## 2021-08-11 LAB — BASIC METABOLIC PANEL
Anion gap: 8 (ref 5–15)
BUN: 25 mg/dL — ABNORMAL HIGH (ref 6–20)
CO2: 28 mmol/L (ref 22–32)
Calcium: 10.1 mg/dL (ref 8.9–10.3)
Chloride: 106 mmol/L (ref 98–111)
Creatinine, Ser: 1.01 mg/dL — ABNORMAL HIGH (ref 0.44–1.00)
GFR, Estimated: >60 mL/min (ref >60)
Glucose, Bld: 71 mg/dL (ref 70–99)
Potassium: 3.5 mmol/L (ref 3.5–5.1)
Sodium: 142 mmol/L (ref 135–145)

## 2021-08-11 MED ORDER — DAPAGLIFLOZIN PROPANEDIOL 10 MG PO TABS: 10.0000 mg | ORAL_TABLET | Freq: Every day | ORAL | 6 refills | Status: DC

## 2021-08-11 MED ORDER — FUROSEMIDE 20 MG PO TABS: ORAL_TABLET | ORAL | 3 refills | Status: DC

## 2021-08-11 NOTE — Patient Instructions (Signed)
CHANGE Lasix to 40 mg daily alternating with 20 mg daily ?START Farxiga 10 mg, one tab daily ? ?Labs today ?We will only contact you if something comes back abnormal or we need to make some changes. ?Otherwise no news is good news! ? ?Your physician wants you to follow-up in: 4 months with Dr Aundra Dubin. You will receive a reminder letter in the mail two months in advance. If you don't receive a letter, please call our office to schedule the follow-up appointment. ? ? ?Do the following things EVERYDAY: ?Weigh yourself in the morning before breakfast. Write it down and keep it in a log. ?Take your medicines as prescribed ?Eat low salt foods--Limit salt (sodium) to 2000 mg per day.  ?Stay as active as you can everyday ?Limit all fluids for the day to less than 2 liters ? ?At the [facility], you and your health needs are our priority. As part of our continuing mission to provide you with exceptional heart care, we have created designated Provider Care Teams. These Care Teams include your primary Cardiologist (physician) and Advanced Practice Providers (APPs- Physician Assistants and Nurse Practitioners) who all work together to provide you with the care you need, when you need it.  ? ?You may see any of the following providers on your designated Care Team at your next follow up: ?Dr Glori Bickers ?Dr Loralie Champagne ?Darrick Grinder, NP ?Lyda Jester, PA ?Jessica Milford,NP ?Marlyce Huge, PA ?[name], PharmD ? ? ?Please be sure to bring in all your medications bottles to every appointment.  ? ? ?If you have any questions or concerns before your next appointment please send [place] a message through [place] or call our office at [phone].   ? ?TO LEAVE A MESSAGE FOR THE NURSE SELECT OPTION 2, PLEASE LEAVE A MESSAGE INCLUDING: ?YOUR NAME ?DATE OF BIRTH ?CALL BACK NUMBER ?REASON FOR CALL**this is important as we prioritize the call backs ? ?YOU WILL RECEIVE A CALL BACK THE SAME DAY AS LONG AS YOU CALL  BEFORE 4:00 PM ? ?

## 2021-08-16 ENCOUNTER — Ambulatory Visit (INDEPENDENT_AMBULATORY_CARE_PROVIDER_SITE_OTHER): Payer: 59

## 2021-08-16 DIAGNOSIS — Z95810 Presence of automatic (implantable) cardiac defibrillator: Secondary | ICD-10-CM | POA: Diagnosis not present

## 2021-08-16 DIAGNOSIS — I5022 Chronic systolic (congestive) heart failure: Secondary | ICD-10-CM

## 2021-08-17 ENCOUNTER — Telehealth: Payer: Self-pay

## 2021-08-17 NOTE — Progress Notes (Addendum)
EPIC Encounter for ICM Monitoring ? ?Patient Name: Erin [name] is a 61 y.o. female ?Date: 08/17/2021 ?Primary Care Physican: [name], MD ?Primary Cardiologist: Aundra Dubin ?Electrophysiologist: [place] ?05/17/2021 Weight: 112 lbs ?05/26/2021 Weight: 112 lbs. ?                                                         ?Attempted call to patient and unable to reach.  Transmission reviewed.  ?  ?Optivol thoracic impedance suggesting normal fluid levels.  ?  ?Prescribed: ?Furosemide 20 mg Take 2 tablets (40 mg total) by mouth every other day alternating with 20 mg every other day. ?  ?Labs: ?08/11/2021 Creatinine 1.01, BUN 25, Potassium 3.0, Sodium 134, GFR 50 ?06/16/2021 Creatinine 1.25, BUN 18, Potassium 3.0, Sodium 134, GFR 50 ?05/15/2021 Creatinine 1.02, BUN 15, Potassium 5.0, Sodium 136, GFR >60 ?A complete set of results can be found in Results Review. ?  ?Recommendations:  Unable to reach.   ?  ?Follow-up plan: ICM clinic phone appointment on 09/20/2021.   91 day device clinic remote transmission 10/06/2021.   ?  ?EP/Cardiology Office Visits:   3 month follow with Dr Aundra Dubin due 11/2021 (no recall).  Recall 04/22/2021 with Dr Curt Bears.   ?  ?Copy of ICM check sent to Dr. Curt Bears.   ? ?3 month ICM trend: 08/16/2021. ? ? ? ?12-14 Month ICM trend:  ? ? ? ?Rosalene Billings, RN ?08/17/2021 ?4:39 PM ? ?

## 2021-08-17 NOTE — Telephone Encounter (Signed)
Remote ICM transmission received.  Attempted call to patient regarding ICM remote transmission and person answering phone stated she was not available. ? ?

## 2021-08-18 NOTE — Progress Notes (Signed)
Spoke with patient and heart failure questions reviewed.  Pt asymptomatic for fluid accumulation.  Reports feeling well at this time and voices no complaints.  ?

## 2021-09-03 ENCOUNTER — Ambulatory Visit (HOSPITAL_COMMUNITY)
Admission: RE | Admit: 2021-09-03 | Discharge: 2021-09-03 | Disposition: A | Discharge status: Home / Self Care | Payer: PRIVATE HEALTH INSURANCE | Source: Ambulatory Visit | Attending: Cardiology | Admitting: Cardiology

## 2021-09-03 ENCOUNTER — Ambulatory Visit: Payer: Medicaid Other | Admitting: Allergy & Immunology

## 2021-09-03 DIAGNOSIS — I6523 Occlusion and stenosis of bilateral carotid arteries: Secondary | ICD-10-CM

## 2021-09-03 DIAGNOSIS — I6529 Occlusion and stenosis of unspecified carotid artery: Secondary | ICD-10-CM | POA: Diagnosis present

## 2021-09-06 ENCOUNTER — Telehealth: Payer: Self-pay | Admitting: "Endocrinology

## 2021-09-06 NOTE — Telephone Encounter (Signed)
Pt is scheduled next Monday 5/8 for a follow up, pt AVS says with Labs, I do not see any labs ordered. Does patient need to get labs? ?

## 2021-09-07 ENCOUNTER — Encounter: Payer: Self-pay | Admitting: Internal Medicine

## 2021-09-07 ENCOUNTER — Other Ambulatory Visit: Payer: Self-pay | Admitting: "Endocrinology

## 2021-09-07 DIAGNOSIS — D3502 Benign neoplasm of left adrenal gland: Secondary | ICD-10-CM

## 2021-09-07 NOTE — Telephone Encounter (Signed)
Pt aware to do labs before appt. ?

## 2021-09-07 NOTE — Telephone Encounter (Signed)
Following up on this.

## 2021-09-13 ENCOUNTER — Ambulatory Visit: Payer: Commercial Managed Care - PPO | Admitting: "Endocrinology

## 2021-09-14 ENCOUNTER — Ambulatory Visit: Payer: Commercial Managed Care - PPO | Admitting: "Endocrinology

## 2021-09-14 ENCOUNTER — Encounter: Payer: Self-pay | Admitting: "Endocrinology

## 2021-09-14 ENCOUNTER — Ambulatory Visit (INDEPENDENT_AMBULATORY_CARE_PROVIDER_SITE_OTHER): Payer: 59 | Admitting: "Endocrinology

## 2021-09-14 VITALS — BP 96/66 | HR 84 | Ht 60.0 in | Wt 121.6 lb

## 2021-09-14 DIAGNOSIS — E2749 Other adrenocortical insufficiency: Secondary | ICD-10-CM | POA: Diagnosis not present

## 2021-09-14 DIAGNOSIS — D3502 Benign neoplasm of left adrenal gland: Secondary | ICD-10-CM

## 2021-09-14 LAB — COMPREHENSIVE METABOLIC PANEL
ALT: 19 IU/L (ref 0–32)
AST: 20 IU/L (ref 0–40)
Albumin/Globulin Ratio: 1.7 (ref 1.2–2.2)
Albumin: 4 g/dL (ref 3.8–4.9)
Alkaline Phosphatase: 94 IU/L (ref 44–121)
BUN/Creatinine Ratio: 14 (ref 9–23)
BUN: 14 mg/dL (ref 6–24)
Bilirubin Total: 0.4 mg/dL (ref 0.0–1.2)
CO2: 23 mmol/L (ref 20–29)
Calcium: 9.7 mg/dL (ref 8.7–10.2)
Chloride: 106 mmol/L (ref 96–106)
Creatinine, Ser: 0.98 mg/dL (ref 0.57–1.00)
Globulin, Total: 2.4 g/dL (ref 1.5–4.5)
Glucose: 83 mg/dL (ref 70–99)
Potassium: 4.1 mmol/L (ref 3.5–5.2)
Sodium: 145 mmol/L — ABNORMAL HIGH (ref 134–144)
Total Protein: 6.4 g/dL (ref 6.0–8.5)
eGFR: 66 mL/min/{1.73_m2} (ref >59)

## 2021-09-14 LAB — CORTISOL-AM, BLOOD: Cortisol - AM: 15.1 ug/dL (ref 6.2–19.4)

## 2021-09-14 LAB — METANEPHRINES, PLASMA
Metanephrine, Free: 22.4 pg/mL (ref 0.0–88.0)
Normetanephrine, Free: 49.8 pg/mL (ref 0.0–244.0)

## 2021-09-14 NOTE — Progress Notes (Signed)
? ?     ?                                           09/14/2021, 7:25 PM ? ?Endocrinology follow-up note ? ? ?Subjective:  ? ? Patient ID: Erin [name], female    DOB: 02-Mar-1961, PCP [name], MD ? ? ?Past Medical History:  ?Diagnosis Date  ? Anxiety state 03/25/2016  ? Basal cell carcinoma of forehead   ? Brain aneurysm   ? CHF (congestive heart failure) ([place])   ? Coronary artery disease   ? a. 03/11/16 PCI with DES-->Prox/Mid Cx;  b. 03/14/16 PCI with DES x2-->RCA, EF 30-35%.  ? Essential hypertension   ? GERD (gastroesophageal reflux disease)   ? HFrEF (heart failure with reduced ejection fraction) ([place])   ? a. 10/2016 Echo: EF 35-40%, Gr1 DD, mild focal basal septal hypertrophy, basal inflat, mid inflat, basal antlat AK. Mid infept/inf/antlat, apical lateral sev HK. Mod MR. mildly reduced RV fxn. Mild TR.  ? History of pneumonia   ? Hyperlipidemia   ? IBS (irritable bowel syndrome)   ? Ischemic cardiomyopathy   ? a. 10/2016 Echo: EF 35-40%, Gr1 DD.  ? Mitral regurgitation   ? NSTEMI (non-ST elevated myocardial infarction) ([place]) 03/10/2016  ? Pneumonia 03/2016  ? Squamous cell cancer of skin of nose   ? Thrombocytosis 03/26/2016  ? Tobacco abuse   ? Trichimoniasis   ? Wears dentures   ? Wears glasses   ? ?Past Surgical History:  ?Procedure Laterality Date  ? APPENDECTOMY    ? BIOPSY  09/20/2018  ? Procedure: BIOPSY;  Surgeon: Daneil Dolin, MD;  [facility];  Service: Endoscopy;;  colon  ? BIOPSY  01/05/2021  ? Procedure: BIOPSY;  Surgeon: Milus Banister, MD;  Location: [facility] ENDOSCOPY;  Service: Endoscopy;;  ? CARDIAC CATHETERIZATION N/A 03/11/2016  ? Procedure: Left Heart Cath and Coronary Angiography;  Surgeon: Leonie Man, MD;  Location: [place] [facility];  Service: Cardiovascular;  Laterality: N/A;  ? CARDIAC CATHETERIZATION N/A 03/11/2016  ? Procedure: Coronary Stent Intervention;  Surgeon: Leonie Man, MD;  Location: [place] [facility];  Service: Cardiovascular;  Laterality: N/A;  ?  CARDIAC CATHETERIZATION N/A 03/14/2016  ? Procedure: Coronary Stent Intervention;  Surgeon: Peter M Martinique, MD;  Location: [place] [facility];  Service: Cardiovascular;  Laterality: N/A;  ? CHOLECYSTECTOMY OPEN  1984  ? COLONOSCOPY WITH PROPOFOL N/A 09/20/2018  ? Procedure: COLONOSCOPY WITH PROPOFOL;  Surgeon: Daneil Dolin, MD;  [facility];  Service: Endoscopy;  Laterality: N/A;  10:30am  ? CORONARY ANGIOPLASTY WITH STENT PLACEMENT  03/14/2016  ? ESOPHAGOGASTRODUODENOSCOPY (EGD) WITH PROPOFOL N/A 09/20/2018  ? Procedure: ESOPHAGOGASTRODUODENOSCOPY (EGD) WITH PROPOFOL;  Surgeon: Daneil Dolin, MD;  [facility];  Service: Endoscopy;  Laterality: N/A;  ? ESOPHAGOGASTRODUODENOSCOPY (EGD) WITH PROPOFOL N/A 01/05/2021  ? Procedure: ESOPHAGOGASTRODUODENOSCOPY (EGD) WITH PROPOFOL;  Surgeon: Milus Banister, MD;  Location: [facility] ENDOSCOPY;  Service: Endoscopy;  Laterality: N/A;  ? FINGER ARTHROPLASTY Left 05/14/2013  ? Procedure: LEFT THUMB CARPAL METACARPAL ARTHROPLASTY;  Surgeon: Tennis Must, MD;  Location: [place];  Service: Orthopedics;  Laterality: Left;  ? ICD IMPLANT N/A 04/03/2020  ? Procedure: ICD IMPLANT;  Surgeon: Constance Haw, MD;  Location: [place] [facility];  Service: Cardiovascular;  Laterality: N/A;  ?  IR ANGIO INTRA EXTRACRAN SEL COM CAROTID INNOMINATE BILAT MOD SED  01/05/2017  ? IR ANGIO INTRA EXTRACRAN SEL COM CAROTID INNOMINATE BILAT MOD SED  03/19/2019  ? IR ANGIO INTRA EXTRACRAN SEL COM CAROTID INNOMINATE BILAT MOD SED  06/04/2020  ? IR ANGIO INTRA EXTRACRAN SEL INTERNAL CAROTID UNI L MOD SED  04/15/2019  ? IR ANGIO VERTEBRAL SEL VERTEBRAL BILAT MOD SED  01/05/2017  ? IR ANGIO VERTEBRAL SEL VERTEBRAL BILAT MOD SED  03/19/2019  ? IR ANGIO VERTEBRAL SEL VERTEBRAL UNI L MOD SED  06/04/2020  ? IR ANGIOGRAM FOLLOW UP STUDY  04/15/2019  ? IR RADIOLOGIST EVAL & MGMT  12/30/2016  ? IR TRANSCATH/EMBOLIZ  04/15/2019  ? IR US GUIDE VASC ACCESS RIGHT  03/19/2019  ? IR US  GUIDE VASC ACCESS RIGHT  06/04/2020  ? MALONEY DILATION N/A 09/20/2018  ? Procedure: MALONEY DILATION;  Surgeon: Daneil Dolin, MD;  [facility];  Service: Endoscopy;  Laterality: N/A;  ? RADIOLOGY WITH ANESTHESIA N/A 04/15/2019  ? Procedure: EMBLOZATION;  Surgeon: Luanne Bras, MD;  [facility];  Service: Radiology;  Laterality: N/A;  ? RIGHT/LEFT HEART CATH AND CORONARY ANGIOGRAPHY N/A 08/19/2019  ? Procedure: RIGHT/LEFT HEART CATH AND CORONARY ANGIOGRAPHY;  Surgeon: Larey Dresser, MD;  Location: [place] [facility];  Service: Cardiovascular;  Laterality: N/A;  ? TUBAL LIGATION  1987  ? VAGINAL HYSTERECTOMY  2009  ? ?Social History  ? ?Socioeconomic History  ? Marital status: Married  ?  Spouse name: Not on file  ? Number of children: Not on file  ? Years of education: Not on file  ? Highest education level: Not on file  ?Occupational History  ? Occupation: CNA  ?Tobacco Use  ? Smoking status: Some Days  ?  Packs/day: 0.10  ?  Years: 15.00  ?  Pack years: 1.50  ?  Types: Cigarettes  ? Smokeless tobacco: Never  ? Tobacco comments:  ?  smokes a cigarette occasionally  ?Vaping Use  ? Vaping Use: Never used  ?Substance and Sexual Activity  ? Alcohol use: Yes  ?  Comment: occasionally  ? Drug use: Not Currently  ?  Types: Marijuana  ?  Comment: former- 2017 last time  ? Sexual activity: Not Currently  ?  Birth control/protection: Surgical  ?  Comment: hyst  ?Other Topics Concern  ? Not on file  ?Social History Narrative  ? Lives with husband in [place] in a one story home with a basement.  Has 4 children.  Works as a Quarry manager.  Education: CNA school.   ? ?Social Determinants of Health  ? ?Financial Resource Strain: Low Risk   ? Difficulty of Paying Living Expenses: Not hard at all  ?Food Insecurity: No Food Insecurity  ? Worried About Charity fundraiser in the Last Year: Never true  ? Ran Out of Food in the Last Year: Never true  ?Transportation Needs: No Transportation Needs  ? Lack of Transportation  (Medical): No  ? Lack of Transportation (Non-Medical): No  ?Physical Activity: Insufficiently Active  ? Days of Exercise per Week: 4 days  ? Minutes of Exercise per Session: 30 min  ?Stress: Stress Concern Present  ? Feeling of Stress : Very much  ?Social Connections: Socially Isolated  ? Frequency of Communication with Friends and Family: Once a week  ? Frequency of Social Gatherings with Friends and Family: Never  ? Attends Religious Services: Never  ? Active Member of Clubs or Organizations: No  ?  Attends Archivist Meetings: Never  ? Marital Status: Married  ? ?Family History  ?Problem Relation Age of Onset  ? Stroke Mother   ? Hypertension Mother   ? Diabetes Mother   ? Heart attack Mother   ? Heart attack Father   ? Diabetes Father   ? Hypertension Father   ? CAD Father   ? Colon polyps Father 46  ?     pre-cancerous   ? Stroke Father   ? Dementia Father   ? Hyperlipidemia Father   ? Breast cancer Maternal Grandmother   ? Cancer Maternal Grandfather   ?     Tongue and esophageal  ? Heart failure Other   ? Colon cancer Neg Hx   ? ?Outpatient Encounter Medications as of 09/14/2021  ?Medication Sig  ? albuterol (VENTOLIN HFA) 108 (90 Base) MCG/ACT inhaler Inhale 2 puffs into the lungs every 4 (four) hours as needed.  ? ALPRAZolam (XANAX) 1 MG tablet Take 1 mg by mouth See admin instructions. Take 1 mg by mouth at bedtime and an additional 1 mg daily as needed for anxiety  ? aspirin EC 81 MG tablet Take 81 mg by mouth every evening.   ? atorvastatin (LIPITOR) 80 MG tablet Take 1 tablet (80 mg total) by mouth daily at 6 PM.  ? carvedilol (COREG) 6.25 MG tablet Take 1 tablet (6.25 mg total) by mouth 2 (two) times daily.  ? clopidogrel (PLAVIX) 75 MG tablet Take 1 tablet (75 mg total) by mouth daily.  ? cyproheptadine (PERIACTIN) 4 MG tablet Take 2 tablets (8 mg total) by mouth 2 (two) times daily.  ? dapagliflozin propanediol (FARXIGA) 10 MG TABS tablet Take 1 tablet (10 mg total) by mouth daily before  breakfast.  ? Dexlansoprazole (DEXILANT) 30 MG capsule DR Take 1 capsule (30 mg total) by mouth daily.  ? EPINEPHrine (EPIPEN 2-PAK) 0.3 mg/0.3 mL IJ SOAJ injection Inject 0.3 mg into the muscle as needed fo

## 2021-09-16 ENCOUNTER — Ambulatory Visit (INDEPENDENT_AMBULATORY_CARE_PROVIDER_SITE_OTHER): Payer: 59

## 2021-09-16 ENCOUNTER — Ambulatory Visit: Payer: 59 | Admitting: Orthopaedic Surgery

## 2021-09-16 ENCOUNTER — Ambulatory Visit: Payer: Self-pay

## 2021-09-16 ENCOUNTER — Encounter: Payer: Self-pay | Admitting: Orthopaedic Surgery

## 2021-09-16 DIAGNOSIS — M25512 Pain in left shoulder: Secondary | ICD-10-CM

## 2021-09-16 DIAGNOSIS — G8929 Other chronic pain: Secondary | ICD-10-CM | POA: Diagnosis not present

## 2021-09-16 DIAGNOSIS — M25511 Pain in right shoulder: Secondary | ICD-10-CM

## 2021-09-16 NOTE — Progress Notes (Signed)
? ?Office Visit Note ?  ?Patient: [name]           ?Date of Birth: Sep 28, 1960           ?MRN: [id number] ?Visit Date: 09/16/2021 ?             ?Requested by: [name], MD ?[address] ?[address] ?[place],  [place] [address] ?PCP: [name], MD ? ? ?Assessment & Plan: ?Visit Diagnoses:  ?1. Chronic pain of both shoulders   ? ? ?Plan: Impression is bilateral shoulder glenohumeral osteoarthritis with possible rotator cuff arthropathy.  At this point, would like to refer the patient to Dr. Ernestina Patches for bilateral shoulder glenohumeral cortisone injections.  If her symptoms do not significantly improve have discussed referral to Dr. Marlou Sa for shoulder replacement consultation.  Call with concerns or questions in the meantime. ? ?Follow-Up Instructions: Return if symptoms worsen or fail to improve.  ? ?Orders:  ?Orders Placed This Encounter  ?Procedures  ? XR Shoulder Left  ? XR Shoulder Right  ? Ambulatory referral to Physical Medicine Rehab  ? ?No orders of the defined types were placed in this encounter. ? ? ? ? Procedures: ?No procedures performed ? ? ?Clinical Data: ?No additional findings. ? ? ?Subjective: ?Chief Complaint  ?Patient presents with  ? Right Shoulder - Pain  ? Left Shoulder - Pain  ? ? ?HPI patient is a pleasant 61 year old female who comes in today with bilateral shoulder pain left greater than right.  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  She has had what sounds like 2 cortisone injections to the left shoulder without long-lasting relief.  No previous MRI, physical therapy or surgery.  In regards to the right shoulder, she has had pain since 2019.  She denies any injury or change in activity.  Just worsening of symptoms.  No previous injection there.  Both shoulders bother her deep within the joint.  She has grinding sensations on the left and  weakness to both.  Movement of the shoulders as well as sleeping on either side seems to worsen her symptoms.  She has not taken Tylenol without relief.  She denies any paresthesias to either upper extremity. ? ?Review of Systems as detailed in HPI.  All others reviewed and are negative. ? ? ?Objective: ?Vital Signs: There were no vitals taken for this visit. ? ?Physical Exam well-developed well-nourished female no acute distress.  Alert and oriented x3. ? ?Ortho Exam left shoulder exam reveals forward flexion to about 150 degrees.  Very limited external rotation.  Internal rotation to her back pocket.  Negative empty can and cross body adduction.  Right shoulder exam reveals full active range of motion all planes.  Negative empty can test.  Negative speeds and O'Brien's.  She is neurovascular tact distally.  4 out of 5 strength throughout. ? ?Specialty Comments:  ?No specialty comments available. ? ?Imaging: ?XR Shoulder Left ? ?Result Date: 09/16/2021 ?Advanced glenohumeral degenerative changes.  Slight superior migration humeral head ? ?XR Shoulder Right ? ?Result Date: 09/16/2021 ?Moderate glenohumeral degenerative changes.  Slight superior migration humeral head  ? ? ?PMFS History: ?Patient Active Problem List  ? Diagnosis Date Noted  ? Nausea without vomiting 06/16/2021  ? Allergy to alpha-gal 06/16/2021  ? Orthostatic hypotension 06/16/2021  ? Hypocortisolemia ([place]) 03/02/2021  ? Adrenal adenoma, left  03/02/2021  ? AKI (acute kidney injury) ([place]) 01/04/2021  ? Intractable nausea and vomiting 01/03/2021  ? Left-sided weakness 10/19/2020  ? Cough 02/11/2020  ? Near syncope 12/10/2019  ? Brain aneurysm 04/15/2019  ? Dysphagia 02/27/2018  ? Encounter for screening colonoscopy 02/27/2018  ? History of Clostridium difficile infection 02/27/2018  ? Chronic diarrhea 12/20/2017  ? Chronic combined systolic and diastolic congestive heart failure ([place]) 06/20/2016  ? Hypokalemia due to excessive gastrointestinal loss of  potassium   ? Acute CHF (congestive heart failure) ([place]) 05/16/2016  ? Acute on chronic systolic CHF (congestive heart failure) ([place]) 05/16/2016  ? Acute respiratory failure with hypoxia ([place])   ? Thrombocytosis 03/26/2016  ? Cardiomyopathy, ischemic 03/25/2016  ? Chronic combined systolic and diastolic heart failure ([place]) 03/25/2016  ? Anxiety state 03/25/2016  ? Troponin level elevated 03/25/2016  ? Coronary artery disease involving native coronary artery of native heart 03/25/2016  ? Normocytic anemia 03/25/2016  ? SOB (shortness of breath) 03/24/2016  ? Lightheadedness 03/17/2016  ? Hypotension 03/17/2016  ? Tobacco abuse 03/12/2016  ? NSTEMI (non-ST elevated myocardial infarction) ([place]) 03/11/2016  ? Atypical chest pain   ? Essential hypertension 09/06/2015  ? Mixed hyperlipidemia 09/06/2015  ? GERD without esophagitis 09/06/2015  ? Chest pain 09/06/2015  ? ?Past Medical History:  ?Diagnosis Date  ? Anxiety state 03/25/2016  ? Basal cell carcinoma of forehead   ? Brain aneurysm   ? CHF (congestive heart failure) ([place])   ? Coronary artery disease   ? a. 03/11/16 PCI with DES-->Prox/Mid Cx;  b. 03/14/16 PCI with DES x2-->RCA, EF 30-35%.  ? Essential hypertension   ? GERD (gastroesophageal reflux disease)   ? HFrEF (heart failure with reduced ejection fraction) ([place])   ? a. 10/2016 Echo: EF 35-40%, Gr1 DD, mild focal basal septal hypertrophy, basal inflat, mid inflat, basal antlat AK. Mid infept/inf/antlat, apical lateral sev HK. Mod MR. mildly reduced RV fxn. Mild TR.  ? History of pneumonia   ? Hyperlipidemia   ? IBS (irritable bowel syndrome)   ? Ischemic cardiomyopathy   ? a. 10/2016 Echo: EF 35-40%, Gr1 DD.  ? Mitral regurgitation   ? NSTEMI (non-ST elevated myocardial infarction) ([place]) 03/10/2016  ? Pneumonia 03/2016  ? Squamous cell cancer of skin of nose   ? Thrombocytosis 03/26/2016  ? Tobacco abuse   ? Trichimoniasis   ? Wears dentures   ? Wears glasses   ?  ?Family History  ?Problem Relation Age of Onset  ?  Stroke Mother   ? Hypertension Mother   ? Diabetes Mother   ? Heart attack Mother   ? Heart attack Father   ? Diabetes Father   ? Hypertension Father   ? CAD Father   ? Colon polyps Father 36  ?     pre-cancerous   ? Stroke Father   ? Dementia Father   ? Hyperlipidemia Father   ? Breast cancer Maternal Grandmother   ? Cancer Maternal Grandfather   ?     Tongue and esophageal  ? Heart failure Other   ? Colon cancer Neg Hx   ?  ?Past Surgical History:  ?Procedure Laterality Date  ? APPENDECTOMY    ? BIOPSY  09/20/2018  ? Procedure: BIOPSY;  Surgeon: Daneil Dolin, MD;  [facility];  Service: Endoscopy;;  colon  ? BIOPSY  01/05/2021  ? Procedure: BIOPSY;  Surgeon: Milus Banister, MD;  Location: [facility];  Service: Endoscopy;;  ? CARDIAC CATHETERIZATION N/A  03/11/2016  ? Procedure: Left Heart Cath and Coronary Angiography;  Surgeon: Leonie Man, MD;  Location: [facility];  Service: Cardiovascular;  Laterality: N/A;  ? CARDIAC CATHETERIZATION N/A 03/11/2016  ? Procedure: Coronary Stent Intervention;  Surgeon: Leonie Man, MD;  Location: [facility];  Service: Cardiovascular;  Laterality: N/A;  ? CARDIAC CATHETERIZATION N/A 03/14/2016  ? Procedure: Coronary Stent Intervention;  Surgeon: Peter M Martinique, MD;  Location: [facility];  Service: Cardiovascular;  Laterality: N/A;  ? CHOLECYSTECTOMY OPEN  1984  ? COLONOSCOPY WITH PROPOFOL N/A 09/20/2018  ? Procedure: COLONOSCOPY WITH PROPOFOL;  Surgeon: Daneil Dolin, MD;  [facility];  Service: Endoscopy;  Laterality: N/A;  10:30am  ? CORONARY ANGIOPLASTY WITH STENT PLACEMENT  03/14/2016  ? ESOPHAGOGASTRODUODENOSCOPY (EGD) WITH PROPOFOL N/A 09/20/2018  ? Procedure: ESOPHAGOGASTRODUODENOSCOPY (EGD) WITH PROPOFOL;  Surgeon: Daneil Dolin, MD;  [facility];  Service: Endoscopy;  Laterality: N/A;  ? ESOPHAGOGASTRODUODENOSCOPY (EGD) WITH PROPOFOL N/A 01/05/2021  ? Procedure: ESOPHAGOGASTRODUODENOSCOPY (EGD) WITH  PROPOFOL;  Surgeon: Milus Banister, MD;  Location[facility];  Service: Endoscopy;  Laterality: N/A;  ? FINGER ARTHROPLASTY Left 05/14/2013  ? Procedure: LEFT THUMB CARPAL METACARPAL ARTHROPLASTY;  Sur

## 2021-09-20 ENCOUNTER — Ambulatory Visit (INDEPENDENT_AMBULATORY_CARE_PROVIDER_SITE_OTHER): Payer: Commercial Managed Care - PPO

## 2021-09-20 DIAGNOSIS — Z95810 Presence of automatic (implantable) cardiac defibrillator: Secondary | ICD-10-CM

## 2021-09-20 DIAGNOSIS — I5022 Chronic systolic (congestive) heart failure: Secondary | ICD-10-CM

## 2021-09-24 NOTE — Progress Notes (Signed)
EPIC Encounter for ICM Monitoring  Patient Name: Erin [name] is a 61 y.o. female Date: 09/24/2021 Primary Care Physican: [name], MD Primary Cardiologist: Aundra Dubin Electrophysiologist: Curt Bears 08/11/2021 Office Weight: 121 lbs.                                                          Transmission reviewed.    Optivol thoracic impedance suggesting normal fluid levels.    Prescribed: Furosemide 20 mg Take 2 tablets (40 mg total) by mouth every other day alternating with 20 mg every other day.   Labs: 09/09/2021 Creatinine 0.98, BUN 14, Potassium 4.1, Sodium 145, GFR 66 08/11/2021 Creatinine 1.01, BUN 25, Potassium 3.0, Sodium 134, GFR 50 06/16/2021 Creatinine 1.25, BUN 18, Potassium 3.0, Sodium 134, GFR 50 05/15/2021 Creatinine 1.02, BUN 15, Potassium 5.0, Sodium 136, GFR >60 A complete set of results can be found in Results Review.   Recommendations:  No changes.    Follow-up plan: ICM clinic phone appointment on 10/25/2021.   91 day device clinic remote transmission 10/06/2021.     EP/Cardiology Office Visits:   3 month follow with Dr Aundra Dubin due 11/2021 (no recall).  Recall 04/22/2021 with Dr Curt Bears.     Copy of ICM check sent to Dr. Curt Bears.   3 month ICM trend: 09/20/2021.    12-14 Month ICM trend:     Rosalene Billings, RN 09/24/2021 9:18 AM

## 2021-10-11 ENCOUNTER — Encounter: Payer: 59 | Admitting: Physical Medicine and Rehabilitation

## 2021-10-11 ENCOUNTER — Telehealth: Payer: Self-pay | Admitting: Physical Medicine and Rehabilitation

## 2021-10-11 NOTE — Telephone Encounter (Signed)
Patient called needing to reschedule her appointment. The number to contact patient is [phone]

## 2021-10-19 ENCOUNTER — Telehealth (HOSPITAL_COMMUNITY): Payer: Self-pay

## 2021-10-19 NOTE — Telephone Encounter (Signed)
Pt agreed to f/u in 6 months with a us carotid. AW 

## 2021-10-25 ENCOUNTER — Telehealth: Payer: Self-pay

## 2021-10-25 NOTE — Telephone Encounter (Signed)
Spoke with patient and requested to send remote transmission.  She has not been staying at home so the scheduled automatic reports are not being sent. She will send manual transmission tomorrow.

## 2021-10-28 NOTE — Progress Notes (Signed)
No ICM remote transmission received for 10/25/2021 and next ICM transmission scheduled for 11/10/2021.

## 2021-10-29 ENCOUNTER — Ambulatory Visit (INDEPENDENT_AMBULATORY_CARE_PROVIDER_SITE_OTHER): Payer: 59

## 2021-10-29 DIAGNOSIS — I255 Ischemic cardiomyopathy: Secondary | ICD-10-CM

## 2021-11-02 ENCOUNTER — Ambulatory Visit: Payer: Self-pay

## 2021-11-02 ENCOUNTER — Encounter: Payer: Self-pay | Admitting: Physical Medicine and Rehabilitation

## 2021-11-02 ENCOUNTER — Ambulatory Visit (INDEPENDENT_AMBULATORY_CARE_PROVIDER_SITE_OTHER): Payer: 59 | Admitting: Physical Medicine and Rehabilitation

## 2021-11-02 DIAGNOSIS — M25512 Pain in left shoulder: Secondary | ICD-10-CM | POA: Diagnosis not present

## 2021-11-02 DIAGNOSIS — G8929 Other chronic pain: Secondary | ICD-10-CM | POA: Diagnosis not present

## 2021-11-02 DIAGNOSIS — M25511 Pain in right shoulder: Secondary | ICD-10-CM | POA: Diagnosis not present

## 2021-11-02 LAB — CUP PACEART REMOTE DEVICE CHECK
Battery Remaining Longevity: 128 mo
Battery Voltage: 3.02 V
Brady Statistic RV Percent Paced: 0 %
Date Time Interrogation Session: 20230623080050
HighPow Impedance: 77 Ohm
Implantable Lead Implant Date: 20211126
Implantable Lead Location: 753860
Implantable Pulse Generator Implant Date: 20211126
Lead Channel Impedance Value: 456 Ohm
Lead Channel Impedance Value: 532 Ohm
Lead Channel Pacing Threshold Amplitude: 0.5 V
Lead Channel Pacing Threshold Pulse Width: 0.4 ms
Lead Channel Sensing Intrinsic Amplitude: 3.5 mV
Lead Channel Sensing Intrinsic Amplitude: 3.5 mV
Lead Channel Setting Pacing Amplitude: 2 V
Lead Channel Setting Pacing Pulse Width: 0.4 ms
Lead Channel Setting Sensing Sensitivity: 0.3 mV

## 2021-11-02 NOTE — Progress Notes (Signed)
   Erin [name] - 61 y.o. female MRN [id number]  Date of birth: [birth year]  Office Visit Note: Visit Date: 11/02/2021 PCP: [name], MD Referred by: Leandrew Koyanagi, MD  Subjective: Chief Complaint  Patient presents with   Right Shoulder - Pain   Left Shoulder - Pain   HPI:  Erin [name] is a 61 y.o. female who comes in today at the request of Dr. Eduard Roux for planned Bilateral anesthetic glenohumeral arthrogram with fluoroscopic guidance.  The patient has failed conservative care including home exercise, medications, time and activity modification.  This injection will be diagnostic and hopefully therapeutic.  Please see requesting physician notes for further details and justification.   ROS Otherwise per HPI.  Assessment & Plan: Visit Diagnoses:    ICD-10-CM   1. Chronic pain of both shoulders  M25.511 Large Joint Inj: bilateral glenohumeral   G89.29 XR C-ARM NO REPORT   M25.512       Plan: No additional findings.   Meds & Orders: No orders of the defined types were placed in this encounter.   Orders Placed This Encounter  Procedures   Large Joint Inj: bilateral glenohumeral   XR C-ARM NO REPORT    Follow-up: Return for visit to requesting provider as needed.   Procedures: Large Joint Inj: bilateral glenohumeral on 11/02/2021 3:30 PM Indications: pain and diagnostic evaluation Details: 22 G 3.5 in needle, fluoroscopy-guided anteromedial approach  Arthrogram: No  Medications (Right): 40 mg triamcinolone acetonide 40 MG/ML; 5 mL bupivacaine 0.25 % Medications (Left): 40 mg triamcinolone acetonide 40 MG/ML; 5 mL bupivacaine 0.25 % Outcome: tolerated well, no immediate complications  There was excellent flow of contrast producing a partial arthrogram of the glenohumeral joint. The patient did have relief of symptoms during the anesthetic phase of the injection. Procedure, treatment alternatives, risks and benefits explained, specific risks discussed. Consent was  given by the patient. Immediately prior to procedure a time out was called to verify the correct patient, procedure, equipment, support staff and site/side marked as required. Patient was prepped and draped in the usual sterile fashion.          Clinical History: No specialty comments available.     Objective:  VS:  HT:    WT:   BMI:     BP:   HR: bpm  TEMP: ( )  RESP:  Physical Exam   Imaging: No results found.

## 2021-11-02 NOTE — Progress Notes (Signed)
Pt state both shoulder pain. Pt state any movement or lifting can make the pain worse. Pt state she uses heat and take over the counter pain to help ease her pain.  Numeric Pain Rating Scale and Functional Assessment Average Pain 6   In the last MONTH (on 0-10 scale) has pain interfered with the following?  1. General activity like being  able to carry out your everyday physical activities such as walking, climbing stairs, carrying groceries, or moving a chair?  Rating(10)   +BT, -Dye Allergies.

## 2021-11-03 NOTE — Progress Notes (Signed)
Remote ICD transmission.   

## 2021-11-12 ENCOUNTER — Telehealth: Payer: Self-pay

## 2021-11-12 NOTE — Telephone Encounter (Signed)
Attempted call to patient to request to send manual remote transmission for fluid level check.  Person answering the phone stated patient is staying with granddaughter that is admitted to the hospital. ICM remote transmission rescheduled for 12/13/2021.

## 2021-11-24 ENCOUNTER — Other Ambulatory Visit (HOSPITAL_COMMUNITY): Payer: Self-pay

## 2021-11-24 ENCOUNTER — Telehealth (HOSPITAL_COMMUNITY): Payer: Self-pay | Admitting: Pharmacy Technician

## 2021-11-24 NOTE — Telephone Encounter (Signed)
Patient Advocate Encounter   Received notification from [facility] that prior authorization for [name] is required.   PA submitted on CoverMyMeds Key [id number] Status is pending   Will continue to follow.

## 2021-11-25 ENCOUNTER — Other Ambulatory Visit (HOSPITAL_COMMUNITY): Payer: Self-pay

## 2021-11-25 NOTE — Telephone Encounter (Signed)
Advanced Heart Failure Patient Advocate Encounter  Prior Authorization for [name] has been approved.    PA# [id number] Effective dates: 11/24/21 through 11/25/22  Charlann Boxer, CPhT

## 2021-11-28 ENCOUNTER — Other Ambulatory Visit (HOSPITAL_COMMUNITY): Payer: Self-pay | Admitting: Cardiology

## 2021-11-30 MED ORDER — TRIAMCINOLONE ACETONIDE 40 MG/ML IJ SUSP
40.0000 mg | INTRAMUSCULAR | Status: AC | PRN
  Administered 2021-11-02: 40 mg via INTRA_ARTICULAR

## 2021-11-30 MED ORDER — BUPIVACAINE HCL 0.25 % IJ SOLN
5.0000 mL | INTRAMUSCULAR | Status: AC | PRN
  Administered 2021-11-02: 5 mL via INTRA_ARTICULAR

## 2021-12-13 ENCOUNTER — Ambulatory Visit (INDEPENDENT_AMBULATORY_CARE_PROVIDER_SITE_OTHER): Payer: 59

## 2021-12-13 DIAGNOSIS — Z95810 Presence of automatic (implantable) cardiac defibrillator: Secondary | ICD-10-CM | POA: Diagnosis not present

## 2021-12-13 DIAGNOSIS — I5022 Chronic systolic (congestive) heart failure: Secondary | ICD-10-CM | POA: Diagnosis not present

## 2021-12-17 ENCOUNTER — Telehealth: Payer: Self-pay

## 2021-12-17 NOTE — Telephone Encounter (Signed)
Remote ICM transmission received.  Attempted call to patient regarding ICM remote transmission and person answering phone stated she was not home.

## 2021-12-17 NOTE — Progress Notes (Signed)
EPIC Encounter for ICM Monitoring  Patient Name: Erin [name] is a 61 y.o. female Date: 12/17/2021 Primary Care Physican: [name], MD Primary Cardiologist: Aundra Dubin Electrophysiologist: Curt Bears 08/11/2021 Office Weight: 121 lbs.                                                          Attempted call to patient and unable to reach.  Transmission reviewed.    Optivol thoracic impedance suggesting normal fluid levels with possible intermittent days of dryness.    Prescribed: Furosemide 20 mg Take 2 tablets (40 mg total) by mouth every other day alternating with 20 mg every other day.   Labs: 09/09/2021 Creatinine 0.98, BUN 14, Potassium 4.1, Sodium 145, GFR 66 08/11/2021 Creatinine 1.01, BUN 25, Potassium 3.0, Sodium 134, GFR 50 06/16/2021 Creatinine 1.25, BUN 18, Potassium 3.0, Sodium 134, GFR 50 05/15/2021 Creatinine 1.02, BUN 15, Potassium 5.0, Sodium 136, GFR >60 A complete set of results can be found in Results Review.   Recommendations:  Unable to reach.     Follow-up plan: ICM clinic phone appointment on 01/17/2022.   91 day device clinic remote transmission 01/28/2022.     EP/Cardiology Office Visits:   3 month follow with Dr Aundra Dubin due 11/2021 (no recall).  Recall 04/22/2021 with Dr Curt Bears.     Copy of ICM check sent to Dr. Curt Bears.   3 month ICM trend: 12/16/2021.    12-14 Month ICM trend:     Rosalene Billings, RN 12/17/2021 12:48 PM

## 2021-12-25 ENCOUNTER — Emergency Department (HOSPITAL_COMMUNITY)
Admission: EM | Admit: 2021-12-25 | Discharge: 2021-12-25 | Disposition: A | Discharge status: Home / Self Care | Payer: 59 | Attending: Emergency Medicine | Admitting: Emergency Medicine

## 2021-12-25 ENCOUNTER — Encounter (HOSPITAL_COMMUNITY): Payer: Self-pay

## 2021-12-25 ENCOUNTER — Emergency Department (HOSPITAL_COMMUNITY): Payer: 59

## 2021-12-25 DIAGNOSIS — Z79899 Other long term (current) drug therapy: Secondary | ICD-10-CM | POA: Insufficient documentation

## 2021-12-25 DIAGNOSIS — R42 Dizziness and giddiness: Secondary | ICD-10-CM | POA: Diagnosis not present

## 2021-12-25 DIAGNOSIS — Z7902 Long term (current) use of antithrombotics/antiplatelets: Secondary | ICD-10-CM | POA: Diagnosis not present

## 2021-12-25 DIAGNOSIS — Z7982 Long term (current) use of aspirin: Secondary | ICD-10-CM | POA: Insufficient documentation

## 2021-12-25 DIAGNOSIS — F17210 Nicotine dependence, cigarettes, uncomplicated: Secondary | ICD-10-CM | POA: Diagnosis not present

## 2021-12-25 DIAGNOSIS — I2510 Atherosclerotic heart disease of native coronary artery without angina pectoris: Secondary | ICD-10-CM | POA: Insufficient documentation

## 2021-12-25 DIAGNOSIS — I110 Hypertensive heart disease with heart failure: Secondary | ICD-10-CM | POA: Insufficient documentation

## 2021-12-25 DIAGNOSIS — I509 Heart failure, unspecified: Secondary | ICD-10-CM | POA: Diagnosis not present

## 2021-12-25 DIAGNOSIS — R0789 Other chest pain: Secondary | ICD-10-CM | POA: Insufficient documentation

## 2021-12-25 DIAGNOSIS — R079 Chest pain, unspecified: Secondary | ICD-10-CM | POA: Diagnosis not present

## 2021-12-25 DIAGNOSIS — R110 Nausea: Secondary | ICD-10-CM | POA: Diagnosis not present

## 2021-12-25 DIAGNOSIS — R69 Illness, unspecified: Secondary | ICD-10-CM | POA: Diagnosis not present

## 2021-12-25 LAB — BASIC METABOLIC PANEL
Anion gap: 10 (ref 5–15)
BUN: 15 mg/dL (ref 6–20)
CO2: 26 mmol/L (ref 22–32)
Calcium: 9.7 mg/dL (ref 8.9–10.3)
Chloride: 101 mmol/L (ref 98–111)
Creatinine, Ser: 1.05 mg/dL — ABNORMAL HIGH (ref 0.44–1.00)
GFR, Estimated: >60 mL/min (ref >60)
Glucose, Bld: 105 mg/dL — ABNORMAL HIGH (ref 70–99)
Potassium: 3 mmol/L — ABNORMAL LOW (ref 3.5–5.1)
Sodium: 137 mmol/L (ref 135–145)

## 2021-12-25 LAB — CBG MONITORING, ED: Glucose-Capillary: 115 mg/dL — ABNORMAL HIGH (ref 70–99)

## 2021-12-25 LAB — CBC
HCT: 39.8 % (ref 36.0–46.0)
Hemoglobin: 13.3 g/dL (ref 12.0–15.0)
MCH: 28 pg (ref 26.0–34.0)
MCHC: 33.4 g/dL (ref 30.0–36.0)
MCV: 83.8 fL (ref 80.0–100.0)
Platelets: 336 10*3/uL (ref 150–400)
RBC: 4.75 MIL/uL (ref 3.87–5.11)
RDW: 15.9 % — ABNORMAL HIGH (ref 11.5–15.5)
WBC: 7.6 10*3/uL (ref 4.0–10.5)
nRBC: 0 % (ref 0.0–0.2)

## 2021-12-25 LAB — TROPONIN I (HIGH SENSITIVITY)
Troponin I (High Sensitivity): 7 ng/L (ref <18)
Troponin I (High Sensitivity): 6 ng/L (ref <18)

## 2021-12-25 LAB — BRAIN NATRIURETIC PEPTIDE: B Natriuretic Peptide: 69 pg/mL (ref 0.0–100.0)

## 2021-12-25 MED ORDER — SUCRALFATE 1 G PO TABS: 1.0000 g | ORAL_TABLET | Freq: Three times a day (TID) | ORAL | 0 refills | Status: DC

## 2021-12-25 MED ORDER — FAMOTIDINE 20 MG PO TABS: 20.0000 mg | ORAL_TABLET | Freq: Two times a day (BID) | ORAL | 0 refills | Status: DC

## 2021-12-25 MED ORDER — ASPIRIN 81 MG PO CHEW
324.0000 mg | CHEWABLE_TABLET | Freq: Once | ORAL | Status: AC
  Administered 2021-12-25: 324 mg via ORAL
  Filled 2021-12-25: qty 4

## 2021-12-25 NOTE — Discharge Instructions (Addendum)
As discussed, your evaluation today has been largely reassuring.  But, it is important that you monitor your condition carefully, and do not hesitate to return to the ED if you develop new, or concerning changes in your condition. ? ?Otherwise, please follow-up with your physician for appropriate ongoing care. ? ?

## 2021-12-25 NOTE — ED Provider Notes (Signed)
[facility] EMERGENCY DEPARTMENT Provider Note   CSN: [id number] Arrival date & time: 12/25/21  1121     History  Chief Complaint  Patient presents with   Chest Pain   Dizziness    Erin [name] is a 61 y.o. female.  HPI Patient presents with concern of chest pain.  She was in her usual state of health until the pain began 4 hours prior to my evaluation.  She has a history of MI, with stents, as well as multiple other medical problems, and CHF.  Since onset pain has been persistent, no relief with Tylenol or ibuprofen.    Home Medications Prior to Admission medications   Medication Sig Start Date End Date Taking? Authorizing Provider  albuterol (VENTOLIN HFA) 108 (90 Base) MCG/ACT inhaler Inhale 2 puffs into the lungs every 4 (four) hours as needed. 04/28/21 04/28/22  [provider]  ALPRAZolam Duanne Moron) 1 MG tablet Take 1 mg by mouth See admin instructions. Take 1 mg by mouth at bedtime and an additional 1 mg daily as needed for anxiety    [provider]  aspirin EC 81 MG tablet Take 81 mg by mouth every evening.     [provider]  atorvastatin (LIPITOR) 80 MG tablet TAKE 1 TABLET BY MOUTH ONCE DAILY AT  [facility] 11/29/21   Larey Dresser, MD  carvedilol (COREG) 6.25 MG tablet Take 1 tablet (6.25 mg total) by mouth 2 (two) times daily. 05/13/21   Larey Dresser, MD  clopidogrel (PLAVIX) 75 MG tablet Take 1 tablet (75 mg total) by mouth daily. 05/13/21   Larey Dresser, MD  cyproheptadine (PERIACTIN) 4 MG tablet Take 2 tablets (8 mg total) by mouth 2 (two) times daily. 06/04/21 08/11/21  Valentina Shaggy, MD  dapagliflozin propanediol (FARXIGA) 10 MG TABS tablet Take 1 tablet (10 mg total) by mouth daily before breakfast. 08/11/21   Milford, Maricela Bo, FNP  Dexlansoprazole (DEXILANT) 30 MG capsule DR Take 1 capsule (30 mg total) by mouth daily. 06/14/21   Valentina Shaggy, MD  EPINEPHrine (EPIPEN 2-PAK) 0.3 mg/0.3 mL IJ SOAJ injection Inject 0.3 mg into  the muscle as needed for anaphylaxis. 04/23/21   Valentina Shaggy, MD  ezetimibe (ZETIA) 10 MG tablet Take 1 tablet by mouth once daily 04/08/21   Larey Dresser, MD  fluticasone [facility]) 50 MCG/ACT nasal spray Place 2 sprays into both nostrils daily as needed. 04/28/21 04/28/22  [provider]  furosemide (LASIX) 20 MG tablet Take 40 mg daily alternating with 20 mg daily 08/11/21   Rafael Bihari, FNP  gabapentin (NEURONTIN) 600 MG tablet Take 300 mg by mouth daily. 04/30/21   [provider]  loperamide (IMODIUM) 2 MG capsule Take 2 mg by mouth 4 (four) times daily as needed for diarrhea or loose stools (ibs).    [provider]  LORazepam (ATIVAN) 1 MG tablet Take 1 mg by mouth daily as needed. 03/10/21   [provider]  losartan (COZAAR) 25 MG tablet Take 1 tablet (25 mg total) by mouth daily. 03/12/21 08/11/21  Larey Dresser, MD  meclizine (ANTIVERT) 25 MG tablet Take 1 tablet (25 mg total) by mouth 3 (three) times daily as needed for dizziness. 05/15/21   Milton Ferguson, MD  Multiple Vitamins-Minerals (MULTIVITAMIN WITH MINERALS) tablet Take 1 tablet by mouth daily.    [provider]  nicotine (NICODERM CQ - DOSED IN MG/24 HOURS) 21 mg/24hr patch Place 21 mg onto the skin  daily.    [provider]  nitroGLYCERIN (NITROSTAT) 0.4 MG SL tablet Place 1 tablet (0.4 mg total) under the tongue every 5 (five) minutes x 3 doses as needed for chest pain (if no relief after 2nd dose, proceed to the ED for an evaluation or call 911). 09/01/20   Verta Ellen., NP  ondansetron (ZOFRAN) 4 MG tablet Take 4 mg by mouth 3 (three) times daily as needed. 03/04/21   [provider]  pantoprazole (PROTONIX) 40 MG tablet Take 1 tablet (40 mg total) by mouth daily. 05/07/21   Rourk, Cristopher Estimable, MD  promethazine (PHENERGAN) 12.5 MG suppository Place 12.5 mg rectally as needed for nausea or vomiting. Pt unsure of dose    [provider]   rOPINIRole (REQUIP) 0.5 MG tablet Take 0.5 mg by mouth at bedtime.  11/13/17   [provider]  spironolactone (ALDACTONE) 25 MG tablet Take 12.5 mg by mouth at bedtime.    [provider]      Allergies    Other and Tape    Review of Systems   Review of Systems  All other systems reviewed and are negative.   Physical Exam Updated Vital Signs BP 98/64   Pulse 71   Temp 98.1 F (36.7 C) (Oral)   Resp (!) 28   Ht 5' (1.524 m)   Wt 54.4 kg   SpO2 96%   BMI 23.44 kg/m  Physical Exam Vitals and nursing note reviewed.  Constitutional:      General: She is not in acute distress.    Appearance: She is well-developed.  HENT:     Head: Normocephalic and atraumatic.  Eyes:     Conjunctiva/sclera: Conjunctivae normal.  Cardiovascular:     Rate and Rhythm: Normal rate and regular rhythm.  Pulmonary:     Effort: Pulmonary effort is normal. No respiratory distress.     Breath sounds: Normal breath sounds. No stridor.  Abdominal:     General: There is no distension.  Skin:    General: Skin is warm and dry.  Neurological:     Mental Status: She is alert and oriented to person, place, and time.     Cranial Nerves: No cranial nerve deficit.  Psychiatric:        Mood and Affect: Mood normal.     ED Results / Procedures / Treatments   Labs (all labs ordered are listed, but only abnormal results are displayed) Labs Reviewed  CBC - Abnormal; Notable for the following components:      Result Value   RDW 15.9 (*)    All other components within normal limits  CBG MONITORING, ED - Abnormal; Notable for the following components:   Glucose-Capillary 115 (*)    All other components within normal limits  BASIC METABOLIC PANEL  BRAIN NATRIURETIC PEPTIDE  POC URINE PREG, ED  TROPONIN I (HIGH SENSITIVITY)    EKG EKG Interpretation  Date/Time:  Saturday December 25 2021 11:33:47 EDT Ventricular Rate:  82 PR Interval:  132 QRS Duration: 79 QT Interval:  377 QTC  Calculation: 441 R Axis:   -10 Text Interpretation: Sinus rhythm Probable left atrial enlargement Inferior infarct, age indeterminate T wave abnormality Abnormal ECG Confirmed by Carmin Muskrat [phone]) on 12/25/2021 12:02:03 PM  Radiology DG Chest Portable 1 View  Result Date: 12/25/2021 CLINICAL DATA:  61 year old female with chest pain, dizziness and nausea since 0800 hours. EXAM: PORTABLE CHEST 1 VIEW COMPARISON:  Portable chest 05/15/2021 and earlier. FINDINGS:  Portable AP upright view at 1153 hours. Slightly lower lung volumes. Allowing for portable technique the lungs are clear. Chronic left chest AICD. Normal cardiac size and mediastinal contours. Visualized tracheal air column is within normal limits. No pneumothorax. Negative visible bowel gas. Cholecystectomy clips. No acute osseous abnormality identified. IMPRESSION: No acute cardiopulmonary abnormality. Electronically Signed   By: Genevie Ann M.D.   On: 12/25/2021 12:02    Procedures Procedures    Medications Ordered in ED Medications  aspirin chewable tablet 324 mg (324 mg Oral Given 12/25/21 1201)    ED Course/ Medical Decision Making/ A&P This patient with a Hx of CAD, CHF, hypertension presents to the ED for concern of chest pain, this involves an extensive number of treatment options, and is a complaint that carries with it a high risk of complications and morbidity.    The differential diagnosis includes ACS, CHF exacerbation, pneumonia, aortic disruption   Social Determinants of Health:  Cigarette smoking  Additional history obtained:  Additional history and/or information obtained from chart review, notable for neurology visit from earlier this year with notation of decreased ejection fraction and echocardiogram from November of last year reviewed, EF 35%   After the initial evaluation, orders, including: Labs x-ray were initiated.   Patient placed on Cardiac and Pulse-Oximetry Monitors. The patient was maintained  on a cardiac monitor.  The cardiac monitored showed an rhythm of 70 sinus normal The patient was also maintained on pulse oximetry. The readings were typically 100% room air normal   On repeat evaluation of the patient stayed the same  Lab Tests:  I personally interpreted labs.  The pertinent results include: 2 normal troponin, mild hypokalemia, otherwise unremarkable labs  Imaging Studies ordered:  I independently visualized and interpreted imaging which showed no pneumonia I agree with the radiologist interpretation   Dispostion / Final MDM:  After consideration of the diagnostic results and the patient's response to treatment, patient and I had a lengthy conversation about all findings.  Though she does have history of heart disease, and multiple other medical problems and is a smoker, she remains essentially the same on repeat exam she was on arrival.  She notes that she has had similar symptoms with stress in the past.  After hours of monitoring she has had no evidence for arrhythmia, no ischemic changes on EKG or monitoring.  2 troponins are normal patient is taking Plavix, aspirin as prescribed, with appropriate meds for management of her risk profile, and there is some suspicion for noncardiac etiology for her ongoing discomfort given the generally reassuring labs, physical exam, x-ray.  She and I had a lengthy conversation about close outpatient follow-up, and she is amenable to the plan with return precautions discussed as well.  Final Clinical Impression(s) / ED Diagnoses Final diagnoses:  Atypical chest pain    Rx / DC Orders ED Discharge Orders          Ordered    famotidine (PEPCID) 20 MG tablet  2 times daily        12/25/21 1449    sucralfate (CARAFATE) 1 g tablet  3 times daily with meals & bedtime       Note to Pharmacy: Take for one week   12/25/21 1449              Carmin Muskrat, MD 12/25/21 1450

## 2021-12-25 NOTE — ED Triage Notes (Signed)
Pt states chest pain, dizziness, nausea since 0800. States pain is mostly in epigastric area. Pt endorses cough, denies SHOB. Denies vomiting, diarrhea.

## 2022-01-12 ENCOUNTER — Emergency Department (HOSPITAL_COMMUNITY)
Admission: EM | Admit: 2022-01-12 | Discharge: 2022-01-12 | Disposition: A | Discharge status: Home / Self Care | Payer: 59 | Attending: Emergency Medicine | Admitting: Emergency Medicine

## 2022-01-12 ENCOUNTER — Other Ambulatory Visit: Payer: Self-pay

## 2022-01-12 ENCOUNTER — Encounter (HOSPITAL_COMMUNITY): Payer: Self-pay

## 2022-01-12 DIAGNOSIS — Z7982 Long term (current) use of aspirin: Secondary | ICD-10-CM | POA: Insufficient documentation

## 2022-01-12 DIAGNOSIS — R21 Rash and other nonspecific skin eruption: Secondary | ICD-10-CM | POA: Diagnosis not present

## 2022-01-12 DIAGNOSIS — I509 Heart failure, unspecified: Secondary | ICD-10-CM | POA: Diagnosis not present

## 2022-01-12 DIAGNOSIS — I2510 Atherosclerotic heart disease of native coronary artery without angina pectoris: Secondary | ICD-10-CM | POA: Diagnosis not present

## 2022-01-12 DIAGNOSIS — Z7902 Long term (current) use of antithrombotics/antiplatelets: Secondary | ICD-10-CM | POA: Insufficient documentation

## 2022-01-12 DIAGNOSIS — B029 Zoster without complications: Secondary | ICD-10-CM | POA: Diagnosis not present

## 2022-01-12 DIAGNOSIS — I110 Hypertensive heart disease with heart failure: Secondary | ICD-10-CM | POA: Insufficient documentation

## 2022-01-12 DIAGNOSIS — B028 Zoster with other complications: Secondary | ICD-10-CM | POA: Diagnosis not present

## 2022-01-12 DIAGNOSIS — L308 Other specified dermatitis: Secondary | ICD-10-CM

## 2022-01-12 MED ORDER — ACYCLOVIR 800 MG PO TABS: 800.0000 mg | ORAL_TABLET | Freq: Every day | ORAL | 0 refills | Status: DC

## 2022-01-12 MED ORDER — LIDOCAINE 5 % EX PTCH: 1.0000 | MEDICATED_PATCH | CUTANEOUS | 0 refills | Status: DC

## 2022-01-12 NOTE — ED Provider Notes (Signed)
[facility] EMERGENCY DEPARTMENT Provider Note   CSN: [id number] Arrival date & time: 01/12/22  1633     History  Chief Complaint  Patient presents with   Rash    Erin [name] is a 61 y.o. female with past medical history significant for hypertension, CHF, tobacco abuse, coronary artery disease who is not up-to-date on her shingles vaccine who presents with concern for rash on her left leg and back.  Patient reports that it started around 2 to 3 days ago, feels itchy, painful, stabbing.  Patient is worried that it may be shingles.  Patient reports new lesion on her back came out just this morning.  She denies any vision changes, hearing changes, lesions elsewhere on the body.   Rash      Home Medications Prior to Admission medications   Medication Sig Start Date End Date Taking? Authorizing Provider  acyclovir (ZOVIRAX) 800 MG tablet Take 1 tablet (800 mg total) by mouth 5 (five) times daily. 01/12/22  Yes Lam Mccubbins H, PA-C  lidocaine (LIDODERM) 5 % Place 1 patch onto the skin daily. Remove & Discard patch within 12 hours or as directed by MD 01/12/22  Yes Taos Tapp H, PA-C  albuterol (VENTOLIN HFA) 108 (90 Base) MCG/ACT inhaler Inhale 2 puffs into the lungs every 4 (four) hours as needed for wheezing or shortness of breath. 04/28/21 04/28/22  [provider]  aspirin EC 81 MG tablet Take 81 mg by mouth every evening.     [provider]  atorvastatin (LIPITOR) 80 MG tablet TAKE 1 TABLET BY MOUTH ONCE DAILY AT  6PM Patient taking differently: Take 80 mg by mouth daily. 11/29/21   Larey Dresser, MD  carvedilol (COREG) 6.25 MG tablet Take 1 tablet (6.25 mg total) by mouth 2 (two) times daily. 05/13/21   Larey Dresser, MD  clopidogrel (PLAVIX) 75 MG tablet Take 1 tablet (75 mg total) by mouth daily. 05/13/21   Larey Dresser, MD  dapagliflozin propanediol (FARXIGA) 10 MG TABS tablet Take 1 tablet (10 mg total) by mouth daily before breakfast.  08/11/21   Milford, Maricela Bo, FNP  EPINEPHrine (EPIPEN 2-PAK) 0.3 mg/0.3 mL IJ SOAJ injection Inject 0.3 mg into the muscle as needed for anaphylaxis. 04/23/21   Valentina Shaggy, MD  ezetimibe (ZETIA) 10 MG tablet Take 1 tablet by mouth once daily 04/08/21   Larey Dresser, MD  famotidine (PEPCID) 20 MG tablet Take 1 tablet (20 mg total) by mouth 2 (two) times daily. 12/25/21   Carmin Muskrat, MD  furosemide (LASIX) 20 MG tablet Take 40 mg daily alternating with 20 mg daily 08/11/21   Rafael Bihari, FNP  gabapentin (NEURONTIN) 600 MG tablet Take 300 mg by mouth daily. 04/30/21   [provider]  loperamide (IMODIUM) 2 MG capsule Take 2 mg by mouth 4 (four) times daily as needed for diarrhea or loose stools (ibs).    [provider]  losartan (COZAAR) 25 MG tablet Take 1 tablet (25 mg total) by mouth daily. 03/12/21   Larey Dresser, MD  meclizine (ANTIVERT) 25 MG tablet Take 1 tablet (25 mg total) by mouth 3 (three) times daily as needed for dizziness. 05/15/21   Milton Ferguson, MD  Multiple Vitamins-Minerals (MULTIVITAMIN WITH MINERALS) tablet Take 1 tablet by mouth daily.    [provider]  nitroGLYCERIN (NITROSTAT) 0.4 MG SL tablet Place 1 tablet (0.4 mg total) under the tongue every 5 (five) minutes x 3 doses as  needed for chest pain (if no relief after 2nd dose, proceed to the ED for an evaluation or call 911). 09/01/20   Verta Ellen., NP  ondansetron (ZOFRAN) 4 MG tablet Take 4 mg by mouth 3 (three) times daily as needed for nausea or vomiting. 03/04/21   [provider]  pantoprazole (PROTONIX) 40 MG tablet Take 1 tablet (40 mg total) by mouth daily. 05/07/21   Rourk, Cristopher Estimable, MD  rOPINIRole (REQUIP) 0.5 MG tablet Take 0.5 mg by mouth at bedtime.  11/13/17   [provider]  spironolactone (ALDACTONE) 25 MG tablet Take 12.5 mg by mouth at bedtime.    [provider]  sucralfate (CARAFATE) 1 g tablet Take 1 tablet (1 g total)  by mouth 4 (four) times daily -  with meals and at bedtime. 12/25/21   Carmin Muskrat, MD  Vitamin D, Ergocalciferol, (DRISDOL) 1.25 MG (50000 UNIT) CAPS capsule Take 50,000 Units by mouth once a week. Takes on Mondays. 12/07/21   [provider]      Allergies    Other and Tape    Review of Systems   Review of Systems  Skin:  Positive for rash.  All other systems reviewed and are negative.   Physical Exam Updated Vital Signs BP (!) 142/93 (BP Location: Right Arm)   Pulse 86   Temp 98 F (36.7 C) (Oral)   Resp 17   Ht 5' (1.524 m)   Wt 57.2 kg   SpO2 100%   BMI 24.61 kg/m  Physical Exam Vitals and nursing note reviewed.  Constitutional:      General: She is not in acute distress.    Appearance: Normal appearance.  HENT:     Head: Normocephalic and atraumatic.  Eyes:     General:        Right eye: No discharge.        Left eye: No discharge.  Cardiovascular:     Rate and Rhythm: Normal rate and regular rhythm.  Pulmonary:     Effort: Pulmonary effort is normal. No respiratory distress.  Musculoskeletal:        General: No deformity.  Skin:    General: Skin is warm and dry.     Comments: Patient with red, blistering vesicular rash on the left anterior thigh, and left lower back in an L2 or L3 dermatomal distribution.  Neurological:     Mental Status: She is alert and oriented to person, place, and time.  Psychiatric:        Mood and Affect: Mood normal.        Behavior: Behavior normal.     ED Results / Procedures / Treatments   Labs (all labs ordered are listed, but only abnormal results are displayed) Labs Reviewed - No data to display  EKG None  Radiology No results found.  Procedures Procedures    Medications Ordered in ED Medications - No data to display  ED Course/ Medical Decision Making/ A&P                           Medical Decision Making  This is a well-appearing 61 year old female who presents with concern for rash,  possible shingles.  Members differential diagnosis includes shingles, SJS, TEN, burning, or other disseminated vesicular rash, HSV, versus other.  This is not an exhaustive differential.  Patient with no Signs of herpes ophthalmicus, or Ramsay Wickard syndrome.  She has no rash other than  in an isolated L2 vs L3 dermatomal distribution. No evidence of more disseminated rash, negative nikolsky sign. Will prescribe acyclovir, lidocaine patches, encouraged at home ibuprofen, tylenol, gabapentin that patient is already prescribed.  Patient discharged in stable condition at this time. Final Clinical Impression(s) / ED Diagnoses Final diagnoses:  Herpes zoster dermatitis    Rx / DC Orders ED Discharge Orders          Ordered    acyclovir (ZOVIRAX) 800 MG tablet  5 times daily        01/12/22 1754    lidocaine (LIDODERM) 5 %  Every 24 hours        01/12/22 1754              Kayode Petion, Jackalyn Lombard 01/12/22 1823    Noemi Chapel, MD 01/22/22 2147

## 2022-01-12 NOTE — Discharge Instructions (Addendum)
Please use Tylenol or ibuprofen for pain.  You may use 600 mg ibuprofen every 6 hours or 1000 mg of Tylenol every 6 hours.  You may choose to alternate between the 2.  This would be most effective.  Not to exceed 4 g of Tylenol within 24 hours.  Not to exceed 3200 mg ibuprofen 24 hours.  You can use your home gabapentin as needed.  Please take the medications I prescribed, you can take the lidocaine patches directly where it hurts to help with the pain, use Benadryl as needed at night to help with itching, sleep.  Please follow-up with your primary care doctor to ensure your symptoms are resolving, return to the emergency department if you begin having severely worse symptoms, rash all over the body, loss of vision, or hearing.

## 2022-01-12 NOTE — ED Triage Notes (Signed)
Pt to er, pt states that she is here for a rash on her back and L leg, states that she is worried that it might be singles. Pt has vesicle like rash in groups with surrounding redness. Pt states that the rash hurts and feels like someone is stabbing her with a knife

## 2022-01-19 NOTE — Progress Notes (Signed)
No ICM remote transmission received for 01/17/2022 and next ICM transmission scheduled for 02/07/2022.

## 2022-01-28 ENCOUNTER — Ambulatory Visit (INDEPENDENT_AMBULATORY_CARE_PROVIDER_SITE_OTHER): Payer: 59

## 2022-01-28 DIAGNOSIS — I255 Ischemic cardiomyopathy: Secondary | ICD-10-CM

## 2022-02-01 LAB — CUP PACEART REMOTE DEVICE CHECK
Battery Remaining Longevity: 126 mo
Battery Voltage: 3.03 V
Brady Statistic RV Percent Paced: 0.01 %
Date Time Interrogation Session: 20230925091346
HighPow Impedance: 87 Ohm
Implantable Lead Implant Date: 20211126
Implantable Lead Location: 753860
Implantable Pulse Generator Implant Date: 20211126
Lead Channel Impedance Value: 532 Ohm
Lead Channel Impedance Value: 627 Ohm
Lead Channel Pacing Threshold Amplitude: 0.625 V
Lead Channel Pacing Threshold Pulse Width: 0.4 ms
Lead Channel Sensing Intrinsic Amplitude: 4.625 mV
Lead Channel Sensing Intrinsic Amplitude: 4.625 mV
Lead Channel Setting Pacing Amplitude: 2 V
Lead Channel Setting Pacing Pulse Width: 0.4 ms
Lead Channel Setting Sensing Sensitivity: 0.3 mV

## 2022-02-04 NOTE — Progress Notes (Signed)
Remote ICD transmission.   

## 2022-02-07 ENCOUNTER — Ambulatory Visit (INDEPENDENT_AMBULATORY_CARE_PROVIDER_SITE_OTHER): Payer: 59

## 2022-02-07 DIAGNOSIS — I5022 Chronic systolic (congestive) heart failure: Secondary | ICD-10-CM

## 2022-02-07 DIAGNOSIS — Z95810 Presence of automatic (implantable) cardiac defibrillator: Secondary | ICD-10-CM

## 2022-02-08 ENCOUNTER — Telehealth: Payer: Self-pay

## 2022-02-08 NOTE — Telephone Encounter (Signed)
Spoke with patient and advised scheduled automatic remote transmission was not received 10/2.  She was not sure why since the monitor is by her bed.  She will send manual transmission later today.

## 2022-02-09 NOTE — Telephone Encounter (Signed)
Returned call to patient as requested by voice mail message regarding transmission.  She reports she is unable to send report and received an error message.   Advised to call [employer] number for assistance and she will call today.  She has the tech support number.

## 2022-02-09 NOTE — Progress Notes (Signed)
EPIC Encounter for ICM Monitoring  Patient Name: Erin [name] is a 61 y.o. female Date: 02/09/2022 Primary Care Physican: [name], MD Primary Cardiologist: Aundra Dubin Electrophysiologist: Curt Bears 08/11/2021 Office Weight: 121 lbs.                                                          Spoke with patient and heart failure questions reviewed.  Transmission results reviewed.  Pt reports drinking too much fluid daily.     Optivol thoracic impedance suggesting possible fluid accumulation starting 9/17.    Prescribed: Furosemide 20 mg Take 2 tablets (40 mg total) by mouth every other day alternating with 20 mg every other day.   Labs: 09/09/2021 Creatinine 0.98, BUN 14, Potassium 4.1, Sodium 145, GFR 66 08/11/2021 Creatinine 1.01, BUN 25, Potassium 3.0, Sodium 134, GFR 50 06/16/2021 Creatinine 1.25, BUN 18, Potassium 3.0, Sodium 134, GFR 50 05/15/2021 Creatinine 1.02, BUN 15, Potassium 5.0, Sodium 136, GFR >60 A complete set of results can be found in Results Review.   Recommendations:  Advised to take 40 mg Furosemide this afternoon and take 40 mg twice tomorrow.  After tomorrow, return to prescribed alternating dosage.   She repeated instructions back correctly.  Also advised to limit fluid intake to 64 oz daily.    Follow-up plan: ICM clinic phone appointment on 02/11/2022 to recheck fluid levels.   91 day device clinic remote transmission 04/29/2022.     EP/Cardiology Office Visits:   3 month follow with Dr Aundra Dubin due 11/2021 (no recall).  Recall 04/22/2021 with Dr Curt Bears.  Advised to call HF clinic for follow up and provided EP scheduler number to call for appt with Dr Curt Bears.    Copy of ICM check sent to Dr. Curt Bears.   3 month ICM trend: 02/09/2022.    12-14 Month ICM trend:     Rosalene Billings, RN 02/09/2022 1:57 PM

## 2022-02-11 ENCOUNTER — Ambulatory Visit (INDEPENDENT_AMBULATORY_CARE_PROVIDER_SITE_OTHER): Payer: 59

## 2022-02-11 DIAGNOSIS — I5022 Chronic systolic (congestive) heart failure: Secondary | ICD-10-CM

## 2022-02-11 DIAGNOSIS — Z95810 Presence of automatic (implantable) cardiac defibrillator: Secondary | ICD-10-CM

## 2022-02-11 NOTE — Progress Notes (Signed)
EPIC Encounter for ICM Monitoring  Patient Name: Erin [name] is a 61 y.o. female Date: 02/11/2022 Primary Care Physican: [name], MD Primary Cardiologist: Aundra Dubin Electrophysiologist: Curt Bears 08/11/2021 Office Weight: 121 lbs.                                                          Spoke with patient and heart failure questions reviewed.  Transmission results reviewed.  Pt asymptomatic for fluid accumulation.  Reports feeling well at this time and voices no complaints.       Optivol thoracic impedance suggesting fluid levels returned close to normal after taking Furosemide 40 mg bid x 2 days.    Prescribed: Furosemide 20 mg Take 2 tablets (40 mg total) by mouth every other day alternating with 20 mg every other day.   Labs: 09/09/2021 Creatinine 0.98, BUN 14, Potassium 4.1, Sodium 145, GFR 66 08/11/2021 Creatinine 1.01, BUN 25, Potassium 3.0, Sodium 134, GFR 50 06/16/2021 Creatinine 1.25, BUN 18, Potassium 3.0, Sodium 134, GFR 50 05/15/2021 Creatinine 1.02, BUN 15, Potassium 5.0, Sodium 136, GFR >60 A complete set of results can be found in Results Review.   Recommendations:  Advise to limit fluid intake to 64 oz daily and call if experiencing any fluid symptoms.   Follow-up plan: ICM clinic phone appointment on 03/21/2022.   91 day device clinic remote transmission 04/29/2022.     EP/Cardiology Office Visits:   03/07/2022 with Dr Aundra Dubin.  Recall 04/22/2021 with Dr Curt Bears.     Copy of ICM check sent to Dr. Curt Bears.   3 month ICM trend: 02/11/2022.    12-14 Month ICM trend:     Rosalene Billings, RN 02/11/2022 7:32 AM

## 2022-02-16 ENCOUNTER — Ambulatory Visit (INDEPENDENT_AMBULATORY_CARE_PROVIDER_SITE_OTHER): Payer: Self-pay | Admitting: Orthopaedic Surgery

## 2022-02-16 DIAGNOSIS — G8929 Other chronic pain: Secondary | ICD-10-CM

## 2022-02-16 DIAGNOSIS — M25511 Pain in right shoulder: Secondary | ICD-10-CM

## 2022-02-16 DIAGNOSIS — M25512 Pain in left shoulder: Secondary | ICD-10-CM

## 2022-02-16 NOTE — Progress Notes (Signed)
Office Visit Note   Patient: Erin [name]           Date of Birth: Jan 27, 1961           MRN: [id number] Visit Date: 02/16/2022              Requested by: [name], MD [address] [place],  [place] [address] PCP: [name], MD   Assessment & Plan: Visit Diagnoses:  1. Chronic pain of both shoulders     Plan: Impression is chronic bilateral shoulder pain both equally as bad.  Patient does have underlying glenohumeral osteoarthritis but I feel she has a component of rotator cuff pathology.  Given her lack of sustained relief following cortisone injection, home exercise program and over-the-counter medications, would like to get MRIs of both shoulders to assess for structural abnormalities.  She will follow-up once these have been completed.  Call with concerns or questions.  Follow-Up Instructions: Return for after MRI.   Orders:  Orders Placed This Encounter  Procedures   MR Shoulder Left w/o contrast   MR Elbow Right w/o contrast   No orders of the defined types were placed in this encounter.     Procedures: No procedures performed   Clinical Data: No additional findings.   Subjective: Chief Complaint  Patient presents with   Right Shoulder - Pain   Left Shoulder - Pain    HPI patient is a pleasant 61 year old female who comes in today with continued bilateral shoulder pain both equally as bad.  Symptoms of been ongoing for years.  She has tried multiple cortisone injections without long-lasting relief.  Her most recent glenohumeral injections were back in June of this year which did significantly helped, but unfortunately only lasted for 2 days.  She continues to have chronic and constant pain to both shoulders.  Worse with any activity.  She has been taking gabapentin and Tylenol without relief.  She is done many years of home exercises without relief.  No previous MRI of either shoulder.  Review of Systems as detailed in HPI.  All others  reviewed and are negative.   Objective: Vital Signs: There were no vitals taken for this visit.  Physical Exam well-developed well-nourished female no acute distress.  Alert and oriented x3.  Ortho Exam unchanged bilateral shoulder exam.  Specialty Comments:  No specialty comments available.  Imaging: No new imaging   PMFS History: Patient Active Problem List   Diagnosis Date Noted   Nausea without vomiting 06/16/2021   Allergy to alpha-gal 06/16/2021   Orthostatic hypotension 06/16/2021   Hypocortisolemia ([place]) 03/02/2021   Adrenal adenoma, left 03/02/2021   AKI (acute kidney injury) ([place]) 01/04/2021   Intractable nausea and vomiting 01/03/2021   Left-sided weakness 10/19/2020   Cough 02/11/2020   Near syncope 12/10/2019   Brain aneurysm 04/15/2019   Dysphagia 02/27/2018   Encounter for screening colonoscopy 02/27/2018   History of Clostridium difficile infection 02/27/2018   Chronic diarrhea 12/20/2017   Chronic combined systolic and diastolic congestive heart failure ([place]) 06/20/2016   Hypokalemia due to excessive gastrointestinal loss of potassium    Acute CHF (congestive heart failure) ([name]) 05/16/2016   Acute on chronic systolic CHF (congestive heart failure) ([place]) 05/16/2016   Acute respiratory failure with hypoxia (HCC)    Thrombocytosis 03/26/2016   Cardiomyopathy, ischemic 03/25/2016   Chronic combined systolic and diastolic heart failure ([place]) 03/25/2016   Anxiety state 03/25/2016   Troponin level elevated 03/25/2016   Coronary artery  disease involving native coronary artery of native heart 03/25/2016   Normocytic anemia 03/25/2016   SOB (shortness of breath) 03/24/2016   Lightheadedness 03/17/2016   Hypotension 03/17/2016   Tobacco abuse 03/12/2016   NSTEMI (non-ST elevated myocardial infarction) ([place]) 03/11/2016   Atypical chest pain    Essential hypertension 09/06/2015   Mixed hyperlipidemia 09/06/2015   GERD without esophagitis 09/06/2015   Chest  pain 09/06/2015   Past Medical History:  Diagnosis Date   Anxiety state 03/25/2016   Basal cell carcinoma of forehead    Brain aneurysm    CHF (congestive heart failure) ([place])    Coronary artery disease    a. 03/11/16 PCI with DES-->Prox/Mid Cx;  b. 03/14/16 PCI with DES x2-->RCA, EF 30-35%.   Essential hypertension    GERD (gastroesophageal reflux disease)    HFrEF (heart failure with reduced ejection fraction) ([place])    a. 10/2016 Echo: EF 35-40%, Gr1 DD, mild focal basal septal hypertrophy, basal inflat, mid inflat, basal antlat AK. Mid infept/inf/antlat, apical lateral sev HK. Mod MR. mildly reduced RV fxn. Mild TR.   History of pneumonia    Hyperlipidemia    IBS (irritable bowel syndrome)    Ischemic cardiomyopathy    a. 10/2016 Echo: EF 35-40%, Gr1 DD.   Mitral regurgitation    NSTEMI (non-ST elevated myocardial infarction) ([place]) 03/10/2016   Pneumonia 03/2016   Squamous cell cancer of skin of nose    Thrombocytosis 03/26/2016   Tobacco abuse    Trichimoniasis    Wears dentures    Wears glasses     Family History  Problem Relation Age of Onset   Stroke Mother    Hypertension Mother    Diabetes Mother    Heart attack Mother    Heart attack Father    Diabetes Father    Hypertension Father    CAD Father    Colon polyps Father 51       pre-cancerous    Stroke Father    Dementia Father    Hyperlipidemia Father    Breast cancer Maternal Grandmother    Cancer Maternal Grandfather        Tongue and esophageal   Heart failure Other    Colon cancer Neg Hx     Past Surgical History:  Procedure Laterality Date   APPENDECTOMY     BIOPSY  09/20/2018   Procedure: BIOPSY;  Surgeon: Daneil Dolin, MD;  [facility];  [name]: Endoscopy;;  colon   BIOPSY  01/05/2021   Procedure: BIOPSY;  Surgeon: Milus Banister, MD;  Location: [facility];  [name]: Endoscopy;;   CARDIAC CATHETERIZATION N/A 03/11/2016   Procedure: Left Heart Cath and Coronary Angiography;   Surgeon: Leonie Man, MD;  Location: [facility];  [name]: Cardiovascular;  Laterality: N/A;   CARDIAC CATHETERIZATION N/A 03/11/2016   Procedure: Coronary Stent Intervention;  Surgeon: Leonie Man, MD;  Location: [facility];  [name]: Cardiovascular;  Laterality: N/A;   CARDIAC CATHETERIZATION N/A 03/14/2016   Procedure: Coronary Stent Intervention;  Surgeon: Peter M Martinique, MD;  Location: [facility];  [name]: Cardiovascular;  Laterality: N/A;   CHOLECYSTECTOMY OPEN  1984   COLONOSCOPY WITH PROPOFOL N/A 09/20/2018   Procedure: COLONOSCOPY WITH PROPOFOL;  Surgeon: Daneil Dolin, MD;  [facility];  [name]: Endoscopy;  Laterality: N/A;  10:30am   CORONARY ANGIOPLASTY WITH STENT PLACEMENT  03/14/2016   ESOPHAGOGASTRODUODENOSCOPY (EGD) WITH PROPOFOL N/A 09/20/2018   Procedure: ESOPHAGOGASTRODUODENOSCOPY (  EGD) WITH PROPOFOL;  Surgeon: Daneil Dolin, MD;  [facility];  [name]: Endoscopy;  Laterality: N/A;   ESOPHAGOGASTRODUODENOSCOPY (EGD) WITH PROPOFOL N/A 01/05/2021   Procedure: ESOPHAGOGASTRODUODENOSCOPY (EGD) WITH PROPOFOL;  Surgeon: Milus Banister, MD;  Location: [facility];  [name]: Endoscopy;  Laterality: N/A;   FINGER ARTHROPLASTY Left 05/14/2013   Procedure: LEFT THUMB CARPAL METACARPAL ARTHROPLASTY;  Surgeon: Tennis Must, MD;  Location: [facility];  [name]: Orthopedics;  Laterality: Left;   ICD IMPLANT N/A 04/03/2020   Procedure: ICD IMPLANT;  Surgeon: Constance Haw, MD;  Location: [facility];  [name]: Cardiovascular;  Laterality: N/A;   IR ANGIO INTRA EXTRACRAN SEL COM CAROTID INNOMINATE BILAT MOD SED  01/05/2017   IR ANGIO INTRA EXTRACRAN SEL COM CAROTID INNOMINATE BILAT MOD SED  03/19/2019   IR ANGIO INTRA EXTRACRAN SEL COM CAROTID INNOMINATE BILAT MOD SED  06/04/2020   IR ANGIO INTRA EXTRACRAN SEL INTERNAL CAROTID UNI L MOD SED  04/15/2019   IR ANGIO VERTEBRAL SEL VERTEBRAL BILAT MOD SED   01/05/2017   IR ANGIO VERTEBRAL SEL VERTEBRAL BILAT MOD SED  03/19/2019   IR ANGIO VERTEBRAL SEL VERTEBRAL UNI L MOD SED  06/04/2020   IR ANGIOGRAM FOLLOW UP STUDY  04/15/2019   IR RADIOLOGIST EVAL & MGMT  12/30/2016   IR TRANSCATH/EMBOLIZ  04/15/2019   IR US GUIDE VASC ACCESS RIGHT  03/19/2019   IR US GUIDE VASC ACCESS RIGHT  06/04/2020   MALONEY DILATION N/A 09/20/2018   Procedure: Venia Minks DILATION;  Surgeon: Daneil Dolin, MD;  [facility];  [name]: Endoscopy;  Laterality: N/A;   RADIOLOGY WITH ANESTHESIA N/A 04/15/2019   Procedure: [employer];  Surgeon: Luanne Bras, MD;  Location: [facility];  [name]: Radiology;  Laterality: N/A;   RIGHT/LEFT HEART CATH AND CORONARY ANGIOGRAPHY N/A 08/19/2019   Procedure: RIGHT/LEFT HEART CATH AND CORONARY ANGIOGRAPHY;  Surgeon: Larey Dresser, MD;  Location: [facility];  [name]: Cardiovascular;  Laterality: N/A;   TUBAL LIGATION  1987   VAGINAL HYSTERECTOMY  2009   Social History   Occupational History   Occupation: CNA  Tobacco Use   Smoking status: Some Days    Packs/day: 0.10    Years: 15.00    Total pack years: 1.50    Types: Cigarettes   Smokeless tobacco: Never   Tobacco comments:    smokes a cigarette occasionally  Vaping Use   Vaping Use: Never used  Substance and Sexual Activity   Alcohol use: Yes    Comment: occasionally   Drug use: Not Currently    Types: Marijuana    Comment: former- 2017 last time   Sexual activity: Not Currently    Birth control/protection: Surgical    Comment: hyst

## 2022-02-22 ENCOUNTER — Other Ambulatory Visit (HOSPITAL_COMMUNITY): Payer: Self-pay

## 2022-02-28 ENCOUNTER — Other Ambulatory Visit: Payer: Self-pay

## 2022-02-28 ENCOUNTER — Emergency Department (HOSPITAL_COMMUNITY): Payer: No Typology Code available for payment source

## 2022-02-28 ENCOUNTER — Emergency Department (HOSPITAL_COMMUNITY)
Admission: EM | Admit: 2022-02-28 | Discharge: 2022-02-28 | Disposition: A | Discharge status: Home / Self Care | Payer: No Typology Code available for payment source | Attending: Emergency Medicine | Admitting: Emergency Medicine

## 2022-02-28 ENCOUNTER — Encounter (HOSPITAL_COMMUNITY): Payer: Self-pay | Admitting: *Deleted

## 2022-02-28 DIAGNOSIS — R42 Dizziness and giddiness: Secondary | ICD-10-CM | POA: Diagnosis present

## 2022-02-28 DIAGNOSIS — I10 Essential (primary) hypertension: Secondary | ICD-10-CM | POA: Diagnosis not present

## 2022-02-28 DIAGNOSIS — Z79899 Other long term (current) drug therapy: Secondary | ICD-10-CM | POA: Insufficient documentation

## 2022-02-28 DIAGNOSIS — Z7982 Long term (current) use of aspirin: Secondary | ICD-10-CM | POA: Insufficient documentation

## 2022-02-28 DIAGNOSIS — E860 Dehydration: Secondary | ICD-10-CM | POA: Diagnosis not present

## 2022-02-28 DIAGNOSIS — Z7902 Long term (current) use of antithrombotics/antiplatelets: Secondary | ICD-10-CM | POA: Diagnosis not present

## 2022-02-28 HISTORY — DX: Other adverse food reactions, not elsewhere classified, initial encounter: T78.1XXA

## 2022-02-28 HISTORY — DX: Zoster without complications: B02.9

## 2022-02-28 HISTORY — DX: Other adverse food reactions, not elsewhere classified, initial encounter: T78.19XA

## 2022-02-28 LAB — CBC WITH DIFFERENTIAL/PLATELET
Abs Immature Granulocytes: 0.08 10*3/uL — ABNORMAL HIGH (ref 0.00–0.07)
Basophils Absolute: 0.1 10*3/uL (ref 0.0–0.1)
Basophils Relative: 1 %
Eosinophils Absolute: 0.1 10*3/uL (ref 0.0–0.5)
Eosinophils Relative: 1 %
HCT: 39.9 % (ref 36.0–46.0)
Hemoglobin: 13.2 g/dL (ref 12.0–15.0)
Immature Granulocytes: 1 %
Lymphocytes Relative: 24 %
Lymphs Abs: 2.5 10*3/uL (ref 0.7–4.0)
MCH: 28.9 pg (ref 26.0–34.0)
MCHC: 33.1 g/dL (ref 30.0–36.0)
MCV: 87.3 fL (ref 80.0–100.0)
Monocytes Absolute: 0.8 10*3/uL (ref 0.1–1.0)
Monocytes Relative: 8 %
Neutro Abs: 6.7 10*3/uL (ref 1.7–7.7)
Neutrophils Relative %: 65 %
Platelets: 326 10*3/uL (ref 150–400)
RBC: 4.57 MIL/uL (ref 3.87–5.11)
RDW: 15.7 % — ABNORMAL HIGH (ref 11.5–15.5)
WBC: 10.3 10*3/uL (ref 4.0–10.5)
nRBC: 0 % (ref 0.0–0.2)

## 2022-02-28 LAB — BASIC METABOLIC PANEL
Anion gap: 9 (ref 5–15)
BUN: 10 mg/dL (ref 6–20)
CO2: 28 mmol/L (ref 22–32)
Calcium: 9.2 mg/dL (ref 8.9–10.3)
Chloride: 100 mmol/L (ref 98–111)
Creatinine, Ser: 0.97 mg/dL (ref 0.44–1.00)
GFR, Estimated: >60 mL/min (ref >60)
Glucose, Bld: 87 mg/dL (ref 70–99)
Potassium: 3.1 mmol/L — ABNORMAL LOW (ref 3.5–5.1)
Sodium: 137 mmol/L (ref 135–145)

## 2022-02-28 LAB — TROPONIN I (HIGH SENSITIVITY)
Troponin I (High Sensitivity): 6 ng/L (ref <18)
Troponin I (High Sensitivity): 6 ng/L (ref <18)

## 2022-02-28 MED ORDER — SODIUM CHLORIDE 0.9 % IV BOLUS
500.0000 mL | Freq: Once | INTRAVENOUS | Status: AC
  Administered 2022-02-28: 500 mL via INTRAVENOUS

## 2022-02-28 MED ORDER — POTASSIUM CHLORIDE CRYS ER 20 MEQ PO TBCR
40.0000 meq | EXTENDED_RELEASE_TABLET | Freq: Once | ORAL | Status: AC
  Administered 2022-02-28: 40 meq via ORAL
  Filled 2022-02-28: qty 2

## 2022-02-28 NOTE — Discharge Instructions (Signed)
Only take your blood pressure medicine once a day until you are seen by your doctor next week.  Record your blood pressure twice a day and show that to your doctor

## 2022-02-28 NOTE — ED Triage Notes (Signed)
Pt with c/o hypotension, 84/48 per pt  at home about an hour ago. "Hard to catch a breath" and c/o dizziness as well.  Pt believes her BP med needs adjusting.

## 2022-02-28 NOTE — ED Provider Notes (Signed)
[facility] Provider Note   CSN: [id number] Arrival date & time: 02/28/22  1634     History  Chief Complaint  Patient presents with   Hypotension    Erin [name] is a 61 y.o. female.  Patient states her blood pressure was low.  She will dizzy.  Patient usually takes blood pressure medicine twice a day but she did not take it this afternoon  The history is provided by the patient and medical records. No language interpreter was used.  Weakness Severity:  Mild Onset quality:  Sudden Timing:  Constant Progression:  Waxing and waning Chronicity:  New Relieved by:  Nothing Worsened by:  Nothing Ineffective treatments:  None tried Associated symptoms: no abdominal pain, no chest pain, no cough, no diarrhea, no frequency, no headaches and no seizures        Home Medications Prior to Admission medications   Medication Sig Start Date End Date Taking? Authorizing Provider  acyclovir (ZOVIRAX) 800 MG tablet Take 1 tablet (800 mg total) by mouth 5 (five) times daily. 01/12/22   Prosperi, Christian H, PA-C  albuterol (VENTOLIN HFA) 108 (90 Base) MCG/ACT inhaler Inhale 2 puffs into the lungs every 4 (four) hours as needed for wheezing or shortness of breath. 04/28/21 04/28/22  [provider]  aspirin EC 81 MG tablet Take 81 mg by mouth every evening.     [provider]  atorvastatin (LIPITOR) 80 MG tablet TAKE 1 TABLET BY MOUTH ONCE DAILY AT  6PM Patient taking differently: Take 80 mg by mouth daily. 11/29/21   Larey Dresser, MD  carvedilol (COREG) 6.25 MG tablet Take 1 tablet (6.25 mg total) by mouth 2 (two) times daily. 05/13/21   Larey Dresser, MD  clopidogrel (PLAVIX) 75 MG tablet Take 1 tablet (75 mg total) by mouth daily. 05/13/21   Larey Dresser, MD  dapagliflozin propanediol (FARXIGA) 10 MG TABS tablet Take 1 tablet (10 mg total) by mouth daily before breakfast. 08/11/21   [place], Maricela Bo, FNP  EPINEPHrine (EPIPEN 2-PAK) 0.3  mg/0.3 mL IJ SOAJ injection Inject 0.3 mg into the muscle as needed for anaphylaxis. 04/23/21   Valentina Shaggy, MD  ezetimibe (ZETIA) 10 MG tablet Take 1 tablet by mouth once daily 04/08/21   Larey Dresser, MD  famotidine (PEPCID) 20 MG tablet Take 1 tablet (20 mg total) by mouth 2 (two) times daily. 12/25/21   Carmin Muskrat, MD  furosemide (LASIX) 20 MG tablet Take 40 mg daily alternating with 20 mg daily 08/11/21   Rafael Bihari, FNP  gabapentin (NEURONTIN) 600 MG tablet Take 300 mg by mouth daily. 04/30/21   [provider]  lidocaine (LIDODERM) 5 % Place 1 patch onto the skin daily. Remove & Discard patch within 12 hours or as directed by MD 01/12/22   Prosperi, Darrick Meigs H, PA-C  loperamide (IMODIUM) 2 MG capsule Take 2 mg by mouth 4 (four) times daily as needed for diarrhea or loose stools (ibs).    [provider]  losartan (COZAAR) 25 MG tablet Take 1 tablet (25 mg total) by mouth daily. 03/12/21   Larey Dresser, MD  meclizine (ANTIVERT) 25 MG tablet Take 1 tablet (25 mg total) by mouth 3 (three) times daily as needed for dizziness. 05/15/21   Milton Ferguson, MD  Multiple Vitamins-Minerals (MULTIVITAMIN WITH MINERALS) tablet Take 1 tablet by mouth daily.    [provider]  nitroGLYCERIN (NITROSTAT) 0.4 MG SL tablet Place 1 tablet (0.4  mg total) under the tongue every 5 (five) minutes x 3 doses as needed for chest pain (if no relief after 2nd dose, proceed to the ED for an evaluation or call 911). 09/01/20   Verta Ellen., NP  ondansetron (ZOFRAN) 4 MG tablet Take 4 mg by mouth 3 (three) times daily as needed for nausea or vomiting. 03/04/21   [provider]  pantoprazole (PROTONIX) 40 MG tablet Take 1 tablet (40 mg total) by mouth daily. 05/07/21   Rourk, Cristopher Estimable, MD  rOPINIRole (REQUIP) 0.5 MG tablet Take 0.5 mg by mouth at bedtime.  11/13/17   [provider]  spironolactone (ALDACTONE) 25 MG tablet Take 12.5 mg by mouth at  bedtime.    [provider]  sucralfate (CARAFATE) 1 g tablet Take 1 tablet (1 g total) by mouth 4 (four) times daily -  with meals and at bedtime. 12/25/21   Carmin Muskrat, MD  Vitamin D, Ergocalciferol, (DRISDOL) 1.25 MG (50000 UNIT) CAPS capsule Take 50,000 Units by mouth once a week. Takes on Mondays. 12/07/21   [provider]      Allergies    Other, Alpha-gal, and Tape    Review of Systems   Review of Systems  Constitutional:  Negative for appetite change and fatigue.  HENT:  Negative for congestion, ear discharge and sinus pressure.   Eyes:  Negative for discharge.  Respiratory:  Negative for cough.   Cardiovascular:  Negative for chest pain.  Gastrointestinal:  Negative for abdominal pain and diarrhea.  Genitourinary:  Negative for frequency and hematuria.  Musculoskeletal:  Negative for back pain.  Skin:  Negative for rash.  Neurological:  Positive for weakness. Negative for seizures and headaches.  Psychiatric/Behavioral:  Negative for hallucinations.     Physical Exam Updated Vital Signs BP 123/74   Pulse 75   Temp 97.9 F (36.6 C) (Oral)   Resp 18   Ht 5' (1.524 m)   Wt 56.7 kg   SpO2 100%   BMI 24.41 kg/m  Physical Exam Vitals and nursing note reviewed.  Constitutional:      Appearance: She is well-developed.  HENT:     Head: Normocephalic.     Right Ear: External ear normal.  Eyes:     General: No scleral icterus.    Conjunctiva/sclera: Conjunctivae normal.  Neck:     Thyroid: No thyromegaly.  Cardiovascular:     Rate and Rhythm: Normal rate and regular rhythm.     Heart sounds: No murmur heard.    No friction rub. No gallop.  Pulmonary:     Breath sounds: No stridor. No wheezing or rales.  Chest:     Chest wall: No tenderness.  Abdominal:     General: There is no distension.     Tenderness: There is no abdominal tenderness. There is no rebound.  Musculoskeletal:        General: Normal range of motion.     Cervical back:  Neck supple.  Lymphadenopathy:     Cervical: No cervical adenopathy.  Skin:    Findings: No erythema or rash.  Neurological:     Mental Status: She is alert and oriented to person, place, and time.     Motor: No abnormal muscle tone.     Coordination: Coordination normal.  Psychiatric:        Behavior: Behavior normal.     ED Results / Procedures / Treatments   Labs (all labs ordered are listed, but only abnormal results  are displayed) Labs Reviewed  CBC WITH DIFFERENTIAL/PLATELET - Abnormal; Notable for the following components:      Result Value   RDW 15.7 (*)    Abs Immature Granulocytes 0.08 (*)    All other components within normal limits  BASIC METABOLIC PANEL - Abnormal; Notable for the following components:   Potassium 3.1 (*)    All other components within normal limits  TROPONIN I (HIGH SENSITIVITY)  TROPONIN I (HIGH SENSITIVITY)    EKG None  Radiology DG Chest 2 View  Result Date: 02/28/2022 CLINICAL DATA:  Shortness of breath, dizziness, and high blood pressure EXAM: CHEST - 2 VIEW COMPARISON:  Chest radiograph dated 12/25/2021 FINDINGS: Lines/tubes: Left chest wall ICD lead projects over the right ventricle. Chest: Lungs are clear without focal consolidation. Pleura: No pneumothorax or pleural effusion. Heart/mediastinum: The heart size and mediastinal contours are within normal limits. Bones: No acute osseous abnormality.  Cholecystectomy clips. IMPRESSION: Clear lungs.  Normal heart size. Electronically Signed   By: Darrin Nipper M.D.   On: 02/28/2022 17:10    Procedures Procedures    Medications Ordered in ED Medications  sodium chloride 0.9 % bolus 500 mL (500 mLs Intravenous New Bag/Given 02/28/22 1947)  potassium chloride SA (KLOR-CON M) CR tablet 40 mEq (40 mEq Oral Given 02/28/22 1947)    ED Course/ Medical Decision Making/ A&P  Patient was hypokalemic and her blood pressure normalized and she did not take her afternoon medicine.  She was given a  liter of fluids and feels better                         Medical Decision Making Amount and/or Complexity of Data Reviewed Labs: ordered. Radiology: ordered.  Risk Prescription drug management.  This patient presents to the ED for concern of weakness and low blood pressure, this involves an extensive number of treatment options, and is a complaint that carries with it a high risk of complications and morbidity.  The differential diagnosis includes dehydration and too much medicine   Co morbidities that complicate the patient evaluation  Hypertension   Additional history obtained:  Additional history obtained from patient External records from outside source obtained and reviewed including hospital records   Lab Tests:  I Ordered, and personally interpreted labs.  The pertinent results include: CBC normal, chemistries show low potassium 3.1   Imaging Studies ordered:  I ordered imaging studies including chest x-ray I independently visualized and interpreted imaging which showed no acute disease I agree with the radiologist interpretation   Cardiac Monitoring: / EKG:  The patient was maintained on a cardiac monitor.  I personally viewed and interpreted the cardiac monitored which showed an underlying rhythm of: Normal sinus rhythm   Consultations Obtained:  No consultant Problem List / ED Course / Critical interventions / Medication management  Hypotension and dehydration I ordered medication including normal saline for dehydration Reevaluation of the patient after these medicines showed that the patient improved I have reviewed the patients home medicines and have made adjustments as needed   Social Determinants of Health:  None   Test / Admission - Considered:  None  Patient will be discharged home with a history of low blood pressure.  That has improved in the emergency department.  She will only take her blood pressure medicine once a day and record it  twice a day.  She is to see her doctor next week  Final Clinical Impression(s) / ED Diagnoses Final diagnoses:  Dehydration    Rx / DC Orders ED Discharge Orders     None         Milton Ferguson, MD 03/08/22 [phone]

## 2022-03-07 ENCOUNTER — Telehealth (HOSPITAL_COMMUNITY): Payer: Self-pay | Admitting: Cardiology

## 2022-03-07 ENCOUNTER — Encounter (HOSPITAL_COMMUNITY): Payer: Self-pay | Admitting: Cardiology

## 2022-03-07 ENCOUNTER — Ambulatory Visit (HOSPITAL_COMMUNITY)
Admission: RE | Admit: 2022-03-07 | Discharge: 2022-03-07 | Disposition: A | Discharge status: Home / Self Care | Payer: No Typology Code available for payment source | Source: Ambulatory Visit | Attending: Cardiology | Admitting: Cardiology

## 2022-03-07 VITALS — BP 90/50 | HR 86 | Wt 127.8 lb

## 2022-03-07 DIAGNOSIS — I5022 Chronic systolic (congestive) heart failure: Secondary | ICD-10-CM

## 2022-03-07 DIAGNOSIS — Z7982 Long term (current) use of aspirin: Secondary | ICD-10-CM | POA: Diagnosis not present

## 2022-03-07 DIAGNOSIS — I255 Ischemic cardiomyopathy: Secondary | ICD-10-CM | POA: Diagnosis not present

## 2022-03-07 DIAGNOSIS — Z79899 Other long term (current) drug therapy: Secondary | ICD-10-CM | POA: Diagnosis not present

## 2022-03-07 DIAGNOSIS — Z7902 Long term (current) use of antithrombotics/antiplatelets: Secondary | ICD-10-CM | POA: Diagnosis not present

## 2022-03-07 DIAGNOSIS — I739 Peripheral vascular disease, unspecified: Secondary | ICD-10-CM | POA: Insufficient documentation

## 2022-03-07 DIAGNOSIS — I252 Old myocardial infarction: Secondary | ICD-10-CM | POA: Insufficient documentation

## 2022-03-07 DIAGNOSIS — I2510 Atherosclerotic heart disease of native coronary artery without angina pectoris: Secondary | ICD-10-CM | POA: Diagnosis not present

## 2022-03-07 DIAGNOSIS — Z955 Presence of coronary angioplasty implant and graft: Secondary | ICD-10-CM | POA: Insufficient documentation

## 2022-03-07 DIAGNOSIS — Z87891 Personal history of nicotine dependence: Secondary | ICD-10-CM | POA: Insufficient documentation

## 2022-03-07 DIAGNOSIS — I6529 Occlusion and stenosis of unspecified carotid artery: Secondary | ICD-10-CM | POA: Diagnosis not present

## 2022-03-07 LAB — LIPID PANEL
Cholesterol: 153 mg/dL (ref 0–200)
HDL: 42 mg/dL (ref >40)
LDL Cholesterol: 54 mg/dL (ref 0–99)
Total CHOL/HDL Ratio: 3.6 RATIO
Triglycerides: 286 mg/dL — ABNORMAL HIGH (ref <150)
VLDL: 57 mg/dL — ABNORMAL HIGH (ref 0–40)

## 2022-03-07 LAB — BASIC METABOLIC PANEL
Anion gap: 9 (ref 5–15)
BUN: 9 mg/dL (ref 6–20)
CO2: 26 mmol/L (ref 22–32)
Calcium: 9.5 mg/dL (ref 8.9–10.3)
Chloride: 102 mmol/L (ref 98–111)
Creatinine, Ser: 0.93 mg/dL (ref 0.44–1.00)
GFR, Estimated: >60 mL/min (ref >60)
Glucose, Bld: 95 mg/dL (ref 70–99)
Potassium: 3.1 mmol/L — ABNORMAL LOW (ref 3.5–5.1)
Sodium: 137 mmol/L (ref 135–145)

## 2022-03-07 MED ORDER — ICOSAPENT ETHYL 1 G PO CAPS: 2.0000 g | ORAL_CAPSULE | Freq: Two times a day (BID) | ORAL | 11 refills | Status: DC

## 2022-03-07 MED ORDER — SPIRONOLACTONE 25 MG PO TABS: 25.0000 mg | ORAL_TABLET | Freq: Every evening | ORAL | 3 refills | Status: DC

## 2022-03-07 NOTE — Telephone Encounter (Signed)
-----   Message from Larey Dresser, MD sent at 03/07/2022  3:38 PM EDT ----- 1. Spironolactone increased to 25 mg daily so will recheck K in 10 days.  2. Add [name] 2 g bid for elevated triglycerides.

## 2022-03-07 NOTE — Patient Instructions (Signed)
INCREASE Spironolactone to 25 mg nighly.  Labs done today, your results will be available in MyChart, we will contact you for abnormal readings.  Repeat blood work in 10 days.  Your physician has requested that you have an echocardiogram. Echocardiography is a painless test that uses sound waves to create images of your heart. It provides your doctor with information about the size and shape of your heart and how well your heart's chambers and valves are working. This procedure takes approximately one hour. There are no restrictions for this procedure. Please do NOT wear cologne, perfume, aftershave, or lotions (deodorant is allowed). Please arrive 15 minutes prior to your appointment time.  Your physician recommends that you schedule a follow-up appointment in: 3 months with an echocardiogram ( January 2024)  ** please call the office in [facility] November to arrange your follow up appointment **  If you have any questions or concerns before your next appointment please send [place] a message through [place] or call our office at [phone].    TO LEAVE A MESSAGE FOR THE NURSE SELECT OPTION 2, PLEASE LEAVE A MESSAGE INCLUDING: YOUR NAME DATE OF BIRTH CALL BACK NUMBER REASON FOR CALL**this is important as we prioritize the call backs  YOU WILL RECEIVE A CALL BACK THE SAME DAY AS LONG AS YOU CALL BEFORE 4:00 PM  At the [facility], you and your health needs are our priority. As part of our continuing mission to provide you with exceptional heart care, we have created designated Provider Care Teams. These Care Teams include your primary Cardiologist (physician) and Advanced Practice Providers (APPs- Physician Assistants and Nurse Practitioners) who all work together to provide you with the care you need, when you need it.   You may see any of the following providers on your designated Care Team at your next follow up: Dr Glori Bickers Dr Loralie Champagne Dr. Roxana Hires, NP Lyda Jester, [name] [facility] [place], [name] Forestine Na, NP [name], PharmD   Please be sure to bring in all your medications bottles to every appointment.

## 2022-03-07 NOTE — Telephone Encounter (Signed)
Patient called.  Patient aware.  

## 2022-03-07 NOTE — Progress Notes (Signed)
PCP: [name], MD HF Cardiology: [name]  61 y.o. with history of CAD, ischemic cardiomyopathy, and PAD was referred by [name] for evaluation of CHF.  She had NSTEMI in 11/17 with PCI to prox/mid LCx and RCA.  Subsequently, has developed ischemic cardiomyopathy.  Most recent echo in 1/21 showed EF 30-35%.  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   RHC/LHC done with exertional chest heaviness and dyspnea in 4/21 showed normal filling pressures, preserved cardiac output, and nonobstructive mild CAD.  ABIs were normal in 4/21.   In 8/21, she had syncope thought to be related to orthostasis from low BP in the setting of cardiac medication titration.  Entresto was decreased.   She was in the ER in 11/21 with chest pain.  Troponin and COVID-19 negative. Echo in 11/21 showed EF 30-35%, normal RV.   She was hospitalized in 6/22 with left-sided weakness/numbness.  No evidence for acute CVA, ?TIA.    Echo in 11/22 showed EF 30-35% with mild LV dilation, normal RV, mild-moderate MR, IVC normal.   She has had trouble with nausea/vomiting/anorexia.  This has finally improved.  It is not clear what caused it, she has been seeing a GI MD, there is some concern for alpha-gal allergy versus gastroparesis.   She returns for followup of CHF.  She is doing well overall, no dyspnea walking on flat ground or with usual activities.  No orthopnea/PND. No chest pain.  She has been under a lot of stress, her daughter passed away this year and she has been caring for her grand-daughter.  She has quit smoking.  Weight is up 6 lbs.    ECG (personally reviewed): NSR, LAE  Labs (2/21): K 3.7, creatinine 0.82 Labs (4/21): K 4.3, creatinine 0.81, LDL 67, TGs 286 Labs (6/21): K 4.7, creatinine 1.22 Labs (11/21): K 3.7, creatinine 0.84, hgb 12.6, LDL 67, HDL 48, TGs 286 Labs (1/22). K 4.4, creatinine 1.03 Labs (6/22): K 3.5, creatinine 1.0 Labs (11/23): K 3.1,  creatinine 0.97  Medtronic device interrogation:  No AF/VT.  Thoracic impedance recently low, now trending up.    PMH: 1. CAD: NSTEMI with DES to proximal and mid LCx in 11/17, staged DES x 2 to RCA later in 11/17.  - Cardiolite (10/20): EF < 30%, large inferior and inferolateral MI with mild peri-infarct ischemia.  - LHC (4/21): Patent stents, nonobstructive CAD.  2. Chronic systolic CHF: Ischemic cardiomyopathy. Medtronic ICD.  - Echo (10/20): EF 35-40%, lateral WMAs, normal RV. - Echo (1/21): EF 30-35%, mild LVH, normal RV - RHC (4/21): mean RA 5, PA 29/3, mean PCWP 12, CI 3.04 - Echo (11/21): EF 30-35%, normal RV.  - Echo (11/22): EF 30-35% with mild LV dilation, normal RV, mild-moderate MR, IVC normal.  3. 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  4. Carotid stenosis: Carotid dopplers (07/68) with 08-81% LICA stenosis.  - Carotid dopplers (6/21): 40-59% BICA stenosis.  - Carotid dopplers (1/22): 10-31% RICA, 59-45% LICA.  - Carotid dopplers (8/59): 29-24% LICA 5. Prior smoker.  6. PAD: ABIs (4/21) Normal.  - Peripheral artery dopplers (12/21): bilateral plaque without focal stenosis.   Social History   Socioeconomic History   Marital status: Married    Spouse name: Not on file   Number of children: Not on file   Years of education: Not on file   Highest education level: Not on file  Occupational History   Occupation: CNA  Tobacco Use  Smoking status: Some Days    Packs/day: 0.10    Years: 15.00    Total pack years: 1.50    Types: Cigarettes   Smokeless tobacco: Never   Tobacco comments:    smokes a cigarette occasionally  Vaping Use   Vaping Use: Never used  Substance and Sexual Activity   Alcohol use: Yes    Comment: occasionally   Drug use: Not Currently    Types: Marijuana    Comment: former- 2017 last time   Sexual activity: Not Currently    Birth control/protection: Surgical    Comment: hyst  Other Topics Concern   Not  on file  Social History Narrative   Lives with husband in [place] in a one story home with a basement.  Has 4 children.  Works as a Quarry manager.  Education: CNA school.    Social Determinants of Health   Financial Resource Strain: Low Risk  (06/11/2021)   Overall Financial Resource Strain (CARDIA)    Difficulty of Paying Living Expenses: Not hard at all  Food Insecurity: No Food Insecurity (06/11/2021)   Hunger Vital Sign    Worried About Running Out of Food in the Last Year: Never true    Ran Out of Food in the Last Year: Never true  Transportation Needs: No Transportation Needs (06/11/2021)   PRAPARE - Hydrologist (Medical): No    Lack of Transportation (Non-Medical): No  Physical Activity: Insufficiently Active (06/11/2021)   Exercise Vital Sign    Days of Exercise per Week: 4 days    Minutes of Exercise per Session: 30 min  Stress: Stress Concern Present (06/11/2021)   [place]    Feeling of Stress : Very much  Social Connections: Socially Isolated (06/11/2021)   Social Connection and Isolation Panel [NHANES]    Frequency of Communication with Friends and Family: Once a week    Frequency of Social Gatherings with Friends and Family: Never    Attends Religious Services: Never    Marine scientist or Organizations: No    Attends Archivist Meetings: Never    Marital Status: Married  Human resources officer Violence: Not At Risk (06/11/2021)   Humiliation, Afraid, Rape, and Kick questionnaire    Fear of Current or Ex-Partner: No    Emotionally Abused: No    Physically Abused: No    Sexually Abused: No   Family History  Problem Relation Age of Onset   Stroke Mother    Hypertension Mother    Diabetes Mother    Heart attack Mother    Heart attack Father    Diabetes Father    Hypertension Father    CAD Father    Colon polyps Father 96       pre-cancerous    Stroke Father    Dementia  Father    Hyperlipidemia Father    Breast cancer Maternal Grandmother    Cancer Maternal Grandfather        Tongue and esophageal   Heart failure Other    Colon cancer Neg Hx    ROS: All systems reviewed and negative except as per HPI.   Current Outpatient Medications  Medication Sig Dispense Refill   albuterol (VENTOLIN HFA) 108 (90 Base) MCG/ACT inhaler Inhale 2 puffs into the lungs every 4 (four) hours as needed for wheezing or shortness of breath.     aspirin EC 81 MG tablet Take 81 mg by mouth every  evening.      atorvastatin (LIPITOR) 80 MG tablet TAKE 1 TABLET BY MOUTH ONCE DAILY AT  6PM 90 tablet 3   carvedilol (COREG) 6.25 MG tablet Take 1 tablet (6.25 mg total) by mouth 2 (two) times daily. 180 tablet 1   clopidogrel (PLAVIX) 75 MG tablet Take 1 tablet (75 mg total) by mouth daily. 90 tablet 3   dapagliflozin propanediol (FARXIGA) 10 MG TABS tablet Take 5 mg by mouth daily.     EPINEPHrine (EPIPEN 2-PAK) 0.3 mg/0.3 mL IJ SOAJ injection Inject 0.3 mg into the muscle as needed for anaphylaxis. 2 each 2   ezetimibe (ZETIA) 10 MG tablet Take 1 tablet by mouth once daily 30 tablet 12   furosemide (LASIX) 20 MG tablet Take 40 mg daily alternating with 20 mg daily 180 tablet 3   gabapentin (NEURONTIN) 600 MG tablet Take 300 mg by mouth daily.     loperamide (IMODIUM) 2 MG capsule Take 2 mg by mouth 4 (four) times daily as needed for diarrhea or loose stools (ibs).     losartan (COZAAR) 25 MG tablet Take 1 tablet (25 mg total) by mouth daily. 90 tablet 3   meclizine (ANTIVERT) 25 MG tablet Take 1 tablet (25 mg total) by mouth 3 (three) times daily as needed for dizziness. 10 tablet 0   Multiple Vitamins-Minerals (MULTIVITAMIN WITH MINERALS) tablet Take 1 tablet by mouth daily.     nitroGLYCERIN (NITROSTAT) 0.4 MG SL tablet Place 1 tablet (0.4 mg total) under the tongue every 5 (five) minutes x 3 doses as needed for chest pain (if no relief after 2nd dose, proceed to the ED for an  evaluation or call 911). 25 tablet 2   ondansetron (ZOFRAN) 4 MG tablet Take 4 mg by mouth 3 (three) times daily as needed for nausea or vomiting.     pantoprazole (PROTONIX) 40 MG tablet Take 1 tablet (40 mg total) by mouth daily. 30 tablet 11   rOPINIRole (REQUIP) 0.5 MG tablet Take 0.5 mg by mouth at bedtime.   0   Vitamin D, Ergocalciferol, (DRISDOL) 1.25 MG (50000 UNIT) CAPS capsule Take 50,000 Units by mouth once a week. Takes on Mondays.     icosapent Ethyl ([name]) 1 g capsule Take 2 capsules (2 g total) by mouth 2 (two) times daily. 120 capsule 11   spironolactone (ALDACTONE) 25 MG tablet Take 1 tablet (25 mg total) by mouth at bedtime. 90 tablet 3   No current facility-administered medications for this encounter.   BP (!) 90/50   Pulse 86   Wt 58 kg (127 lb 12.8 oz)   SpO2 98%   BMI 24.96 kg/m  General: NAD Neck: No JVD, no thyromegaly or thyroid nodule.  Lungs: Clear to auscultation bilaterally with normal respiratory effort. CV: Nondisplaced PMI.  Heart regular S1/S2, no S3/S4, no murmur.  No peripheral edema.  No carotid bruit.  Normal pedal pulses.  Abdomen: Soft, nontender, no hepatosplenomegaly, no distention.  Skin: Intact without lesions or rashes.  Neurologic: Alert and oriented x 3.  Psych: Normal affect. Extremities: No clubbing or cyanosis.  HEENT: Normal.   Assessment/Plan: 1. CAD: S/p NSTEMI in 11/17 with DEX to LCx and DES x 2 to RCA.  Cardiolite in 10/20 with EF <30%, inferior/inferolateral infarct with peri-infarct ischemia. LHC in 4/21 showed nonobstructive mild CAD.  No chest pain.  - Continue atorvastatin and Zetia. Check lipids today.  - Continue ASA 81 and Plavix 75 mg daily for now.  She  will continue Plavix as long as Dr. Estanislado Pandy feels it is needed post her cranial circulation intervention.  2. Chronic systolic CHF: Ischemic cardiomyopathy. Echo in 11/22 showed stable EF 30-35%.  RHC in 4/21 with normal filling pressures and preserved cardiac  output.  NYHA class II symptoms.  Not volume overloaded by exam or Optivol.  - Continue losartan 25 mg daily, BP has not tolerate Entresto.  - Continue Lasix 40 daily alternating with 20 daily.  BMET today.  - Continue Coreg 6.25 mg bid.      - Increase spironolactone to 25 mg daily, take qhs.  BMET in 10 days.  - Continue dapagliflozin 10 mg daily.   - Repeat echo at followup in 3 months. 3. Carotid stenosis: Repeat carotids in 4/24.  4. Smoking: She has quit.  5. 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  - Continue Plavix per Dr. Estanislado Pandy.   Followup 3 months with echo.   Loralie Champagne 03/07/2022

## 2022-03-08 ENCOUNTER — Other Ambulatory Visit (HOSPITAL_COMMUNITY): Payer: Self-pay

## 2022-03-08 ENCOUNTER — Telehealth (HOSPITAL_COMMUNITY): Payer: Self-pay | Admitting: Pharmacy Technician

## 2022-03-08 NOTE — Telephone Encounter (Signed)
Patient Advocate Encounter   Received notification from [facility] that prior authorization for Icosapent Ethyl is required.   PA submitted on CoverMyMeds Key  [id number] Status is pending   Will continue to follow.

## 2022-03-08 NOTE — Telephone Encounter (Signed)
Advanced Heart Failure Patient Advocate Encounter  Prior Authorization for Icosapent Ethyl has been approved.    Effective dates: 03/04/22 through 09/04/22  Patients co-pay is $30 (30 days)  Charlann Boxer, CPhT

## 2022-03-17 ENCOUNTER — Ambulatory Visit (HOSPITAL_COMMUNITY)
Admission: RE | Admit: 2022-03-17 | Discharge: 2022-03-17 | Disposition: A | Discharge status: Home / Self Care | Payer: Medicaid Other | Source: Ambulatory Visit | Attending: Cardiology | Admitting: Cardiology

## 2022-03-17 ENCOUNTER — Telehealth (HOSPITAL_COMMUNITY): Payer: Self-pay | Admitting: Surgery

## 2022-03-17 DIAGNOSIS — I5022 Chronic systolic (congestive) heart failure: Secondary | ICD-10-CM | POA: Insufficient documentation

## 2022-03-17 LAB — BASIC METABOLIC PANEL
Anion gap: 9 (ref 5–15)
BUN: 11 mg/dL (ref 6–20)
CO2: 27 mmol/L (ref 22–32)
Calcium: 9.1 mg/dL (ref 8.9–10.3)
Chloride: 104 mmol/L (ref 98–111)
Creatinine, Ser: 1.11 mg/dL — ABNORMAL HIGH (ref 0.44–1.00)
GFR, Estimated: 57 mL/min — ABNORMAL LOW (ref >60)
Glucose, Bld: 103 mg/dL — ABNORMAL HIGH (ref 70–99)
Potassium: 2.8 mmol/L — ABNORMAL LOW (ref 3.5–5.1)
Sodium: 140 mmol/L (ref 135–145)

## 2022-03-17 MED ORDER — POTASSIUM CHLORIDE CRYS ER 20 MEQ PO TBCR: 40.0000 meq | EXTENDED_RELEASE_TABLET | Freq: Every day | ORAL | 6 refills | Status: DC

## 2022-03-17 NOTE — Telephone Encounter (Signed)
-----   Message from Larey Dresser, MD sent at 03/17/2022  3:33 PM EST ----- Add KCl 40 mEq daily, BMET 1 week.

## 2022-03-17 NOTE — Telephone Encounter (Signed)
I called patient to review results and recommendations per provider.  I will send prescription to patient's pharmacy of choice and have updated medlist in [facility]

## 2022-03-20 ENCOUNTER — Other Ambulatory Visit (HOSPITAL_COMMUNITY): Payer: Self-pay | Admitting: Cardiology

## 2022-03-21 ENCOUNTER — Ambulatory Visit (INDEPENDENT_AMBULATORY_CARE_PROVIDER_SITE_OTHER): Payer: 59

## 2022-03-21 DIAGNOSIS — I5022 Chronic systolic (congestive) heart failure: Secondary | ICD-10-CM | POA: Diagnosis not present

## 2022-03-21 DIAGNOSIS — Z95810 Presence of automatic (implantable) cardiac defibrillator: Secondary | ICD-10-CM | POA: Diagnosis not present

## 2022-03-24 ENCOUNTER — Telehealth (HOSPITAL_COMMUNITY): Payer: Self-pay

## 2022-03-24 ENCOUNTER — Telehealth: Payer: Self-pay

## 2022-03-24 ENCOUNTER — Encounter: Payer: Self-pay | Admitting: Cardiology

## 2022-03-24 NOTE — Telephone Encounter (Signed)
Patient called reporting she started with shortness of breath after starting [name] (started last Friday). Patient reports shortness of breath is worse with activity. Patient able to complete full sentences while talking on the phone with no acute distress noted. Denies chest pain or dizziness. Remote transmission reviewed and shows normal device function. No episodes. Presenting rhythm is ST 117 bpm. Optivol was elevated and returned to normal 3 days ago. Advised I will forward to [name] for review.   ED precautions given with verbal understanding from patient.

## 2022-03-24 NOTE — Telephone Encounter (Signed)
I called the patient about missed ICM transmission. Transmission received.   The patient states her breathing been off all morning. I let her speak with Leigh, rn.

## 2022-03-24 NOTE — Telephone Encounter (Signed)
Patient advised and verbalized understanding 

## 2022-03-24 NOTE — Telephone Encounter (Signed)
[place] called and stated she is having increased shortness of breath. No edema, no weight changes noted. She denies any chest pain or other symptoms. She stated this started when she began taking the [name] about a week ago. She stated she also ran a device check today and submitted. Please advise.

## 2022-03-24 NOTE — Telephone Encounter (Signed)
Shortness of breath may have been actually due to volume overload, not [name].  This appears to have improved.  Would not change anything with Optivol back to normal. Have her call if symptoms are not improved over the next couple days.

## 2022-03-25 ENCOUNTER — Ambulatory Visit (HOSPITAL_COMMUNITY)
Admission: RE | Admit: 2022-03-25 | Discharge: 2022-03-25 | Disposition: A | Discharge status: Home / Self Care | Payer: Medicaid Other | Source: Ambulatory Visit | Attending: Cardiology | Admitting: Cardiology

## 2022-03-25 DIAGNOSIS — I5022 Chronic systolic (congestive) heart failure: Secondary | ICD-10-CM | POA: Insufficient documentation

## 2022-03-25 LAB — BASIC METABOLIC PANEL
Anion gap: 10 (ref 5–15)
BUN: 20 mg/dL (ref 6–20)
CO2: 25 mmol/L (ref 22–32)
Calcium: 9.4 mg/dL (ref 8.9–10.3)
Chloride: 102 mmol/L (ref 98–111)
Creatinine, Ser: 1.08 mg/dL — ABNORMAL HIGH (ref 0.44–1.00)
GFR, Estimated: 59 mL/min — ABNORMAL LOW (ref >60)
Glucose, Bld: 106 mg/dL — ABNORMAL HIGH (ref 70–99)
Potassium: 4.7 mmol/L (ref 3.5–5.1)
Sodium: 137 mmol/L (ref 135–145)

## 2022-03-25 NOTE — Progress Notes (Signed)
EPIC Encounter for ICM Monitoring  Patient Name: Erin [name] is a 61 y.o. female Date: 03/25/2022 Primary Care Physican: [name], MD Primary Cardiologist: Aundra Dubin Electrophysiologist: Curt Bears 08/11/2021 Office Weight: 121 lbs. 03/25/2022 Office Weight 125-127 lbs                                                          Spoke with patient and heart failure questions reviewed.  Transmission results reviewed.  Pt asymptomatic for fluid accumulation.  Reports feeling well at this time and voices no complaints.   She is unsure what is causing possible fluid accumulation since she is following low salt diet and limiting fluid intake.  She will be having echo done in the next couple of months.    Optivol thoracic impedance normal but was suggesting possible fluid accumulation 10/23-11/11.   Prescribed: Furosemide 20 mg Take 2 tablets (40 mg total) by mouth every other day alternating with 20 mg every other day. Potassium 20 mEq take 2 tablets(40 mEq total) by mouth daily Spironolactone 25 mg take 1 tablet daily   Labs: 03/25/2022 Creatinine 1.08, BUN 20, Potassium 4.7, Sodium 137, GFR 59 03/17/2022 Creatinine 1.11, BUN 11, Potassium 2.8, Sodium 140, GFR 57  03/07/2022 Creatinine 0.93, BUN 9,   Potassium 3.1, Sodium 17, GFR >60  02/28/2022 Creatinine 0.97, BUN 10, Potassium 3.1, Sodium 137, GFR >60  12/25/2021 Creatinine 1.05, BUN 15, Potassium 3.0, Sodium 137, GFR >60  09/09/2021 Creatinine 0.98, BUN 14, Potassium 4.1, Sodium 145, GFR 66 08/11/2021 Creatinine 1.01, BUN 25, Potassium 3.0, Sodium 134, GFR 50 06/16/2021 Creatinine 1.25, BUN 18, Potassium 3.0, Sodium 134, GFR 50 05/15/2021 Creatinine 1.02, BUN 15, Potassium 5.0, Sodium 136, GFR >60 A complete set of results can be found in Results Review.   Recommendations:  Advise to limit fluid intake to 64 oz daily and call if experiencing any fluid symptoms.   Follow-up plan: ICM clinic phone appointment on 04/25/2022.   91 day device  clinic remote transmission 04/29/2022.     EP/Cardiology Office Visits:  Recall 06/09/2022 with Dr Aundra Dubin.  04/18/2021 with Dr Curt Bears.     Copy of ICM check sent to Dr. Curt Bears.    3 month ICM trend: 03/24/2022.    12-14 Month ICM trend:     Rosalene Billings, RN 03/25/2022 2:19 PM

## 2022-03-29 ENCOUNTER — Ambulatory Visit: Payer: No Typology Code available for payment source | Admitting: Internal Medicine

## 2022-03-30 ENCOUNTER — Ambulatory Visit (INDEPENDENT_AMBULATORY_CARE_PROVIDER_SITE_OTHER): Payer: 59 | Admitting: Orthopaedic Surgery

## 2022-03-30 DIAGNOSIS — M25511 Pain in right shoulder: Secondary | ICD-10-CM

## 2022-03-30 DIAGNOSIS — M25512 Pain in left shoulder: Secondary | ICD-10-CM

## 2022-03-30 DIAGNOSIS — G8929 Other chronic pain: Secondary | ICD-10-CM

## 2022-03-30 NOTE — Progress Notes (Signed)
Office Visit Note   Patient: Erin [name]           Date of Birth: 01/15/1961           MRN: [id number] Visit Date: 03/30/2022              Requested by: [name], [address] [place],  [place] [address] PCP: [name], MD   Assessment & Plan: Visit Diagnoses:  1. Chronic pain of both shoulders     Plan: Impression is chronic bilateral shoulder pain.  Patient has not received much relief from conservative treatments for the last 6 months.  We will order bilateral shoulder MRIs to assess the degree of degenerative joint disease and integrity of her rotator cuff.  Follow-up after the MRIs.  Follow-Up Instructions: No follow-ups on file.   Orders:  Orders Placed This Encounter  Procedures   MR Shoulder Left w/o contrast   MR Elbow Right w/o contrast   No orders of the defined types were placed in this encounter.     Procedures: No procedures performed   Clinical Data: No additional findings.   Subjective: Chief Complaint  Patient presents with   Right Shoulder - Pain   Left Shoulder - Pain    HPI [place] returns today for bilateral shoulder pain.  We have been seeing her for the last 6 months.  She has undergone cortisone injections and activity modifications. Review of Systems   Objective: Vital Signs: There were no vitals taken for this visit.  Physical Exam  Ortho Exam Examination of the shoulders is unchanged. Specialty Comments:  No specialty comments available.  Imaging: No results found.   PMFS History: Patient Active Problem List   Diagnosis Date Noted   Nausea without vomiting 06/16/2021   Allergy to alpha-gal 06/16/2021   Orthostatic hypotension 06/16/2021   Hypocortisolemia ([place]) 03/02/2021   Adrenal adenoma, left 03/02/2021   AKI (acute kidney injury) ([place]) 01/04/2021   Intractable nausea and vomiting 01/03/2021   Left-sided weakness 10/19/2020   Cough 02/11/2020   Near syncope 12/10/2019   Brain  aneurysm 04/15/2019   Dysphagia 02/27/2018   Encounter for screening colonoscopy 02/27/2018   History of Clostridium difficile infection 02/27/2018   Chronic diarrhea 12/20/2017   Chronic combined systolic and diastolic congestive heart failure ([place]) 06/20/2016   Hypokalemia due to excessive gastrointestinal loss of potassium    Acute CHF (congestive heart failure) ([place]) 05/16/2016   Acute on chronic systolic CHF (congestive heart failure) ([place]) 05/16/2016   Acute respiratory failure with hypoxia ([facility])    Thrombocytosis 03/26/2016   Cardiomyopathy, ischemic 03/25/2016   Chronic combined systolic and diastolic heart failure ([place]) 03/25/2016   Anxiety state 03/25/2016   Troponin level elevated 03/25/2016   Coronary artery disease involving native coronary artery of native heart 03/25/2016   Normocytic anemia 03/25/2016   SOB (shortness of breath) 03/24/2016   Lightheadedness 03/17/2016   Hypotension 03/17/2016   Tobacco abuse 03/12/2016   NSTEMI (non-ST elevated myocardial infarction) ([place]) 03/11/2016   Atypical chest pain    Essential hypertension 09/06/2015   Mixed hyperlipidemia 09/06/2015   GERD without esophagitis 09/06/2015   Chest pain 09/06/2015   Past Medical History:  Diagnosis Date   Allergic reaction to alpha-gal    Anxiety state 03/25/2016   Basal cell carcinoma of forehead    Brain aneurysm    CHF (congestive heart failure) ([place])    Coronary artery disease    a. 03/11/16 PCI with DES-->Prox/Mid Cx;  b. 03/14/16 PCI with DES x2-->RCA, EF 30-35%.   Essential hypertension    GERD (gastroesophageal reflux disease)    HFrEF (heart failure with reduced ejection fraction) ([place])    a. 10/2016 Echo: EF 35-40%, Gr1 DD, mild focal basal septal hypertrophy, basal inflat, mid inflat, basal antlat AK. Mid infept/inf/antlat, apical lateral sev HK. Mod MR. mildly reduced RV fxn. Mild TR.   History of pneumonia    Hyperlipidemia    IBS (irritable bowel syndrome)    Ischemic  cardiomyopathy    a. 10/2016 Echo: EF 35-40%, Gr1 DD.   Mitral regurgitation    NSTEMI (non-ST elevated myocardial infarction) ([place]) 03/10/2016   Pneumonia 03/2016   Shingles    Squamous cell cancer of skin of nose    Thrombocytosis 03/26/2016   Tobacco abuse    Trichimoniasis    Wears dentures    Wears glasses     Family History  Problem Relation Age of Onset   Stroke Mother    Hypertension Mother    Diabetes Mother    Heart attack Mother    Heart attack Father    Diabetes Father    Hypertension Father    CAD Father    Colon polyps Father 71       pre-cancerous    Stroke Father    Dementia Father    Hyperlipidemia Father    Breast cancer Maternal Grandmother    Cancer Maternal Grandfather        Tongue and esophageal   Heart failure Other    Colon cancer Neg Hx     Past Surgical History:  Procedure Laterality Date   APPENDECTOMY     BIOPSY  09/20/2018   Procedure: BIOPSY;  Surgeon: Daneil Dolin, MD;  [facility];  Service: Endoscopy;;  colon   BIOPSY  01/05/2021   Procedure: BIOPSY;  Surgeon: Milus Banister, MD;  Location: [facility];  Service: Endoscopy;;   CARDIAC CATHETERIZATION N/A 03/11/2016   Procedure: Left Heart Cath and Coronary Angiography;  Surgeon: Leonie Man, MD;  Location: [facility];  Service: Cardiovascular;  Laterality: N/A;   CARDIAC CATHETERIZATION N/A 03/11/2016   Procedure: Coronary Stent Intervention;  Surgeon: Leonie Man, MD;  Location: [facility];  Service: Cardiovascular;  Laterality: N/A;   CARDIAC CATHETERIZATION N/A 03/14/2016   Procedure: Coronary Stent Intervention;  Surgeon: Peter M Martinique, MD;  Location: [facility];  Service: Cardiovascular;  Laterality: N/A;   CHOLECYSTECTOMY OPEN  1984   COLONOSCOPY WITH PROPOFOL N/A 09/20/2018   Procedure: COLONOSCOPY WITH PROPOFOL;  Surgeon: Daneil Dolin, MD;  [facility];  Service: Endoscopy;  Laterality: N/A;  10:30am   CORONARY  ANGIOPLASTY WITH STENT PLACEMENT  03/14/2016   ESOPHAGOGASTRODUODENOSCOPY (EGD) WITH PROPOFOL N/A 09/20/2018   Procedure: ESOPHAGOGASTRODUODENOSCOPY (EGD) WITH PROPOFOL;  Surgeon: Daneil Dolin, MD;  [facility];  Service: Endoscopy;  Laterality: N/A;   ESOPHAGOGASTRODUODENOSCOPY (EGD) WITH PROPOFOL N/A 01/05/2021   Procedure: ESOPHAGOGASTRODUODENOSCOPY (EGD) WITH PROPOFOL;  Surgeon: Milus Banister, MD;  Location[facility];  Service: Endoscopy;  Laterality: N/A;   FINGER ARTHROPLASTY Left 05/14/2013   Procedure: LEFT THUMB CARPAL METACARPAL ARTHROPLASTY;  Surgeon: Tennis Must, MD;  Location: [facility];  Service: Orthopedics;  Laterality: Left;   ICD IMPLANT N/A 04/03/2020   Procedure: ICD IMPLANT;  Surgeon: Constance Haw, MD;  Location: [facility];  Service: Cardiovascular;  Laterality: N/A;   IR  ANGIO INTRA EXTRACRAN SEL COM CAROTID INNOMINATE BILAT MOD SED  01/05/2017   IR ANGIO INTRA EXTRACRAN SEL COM CAROTID INNOMINATE BILAT MOD SED  03/19/2019   IR ANGIO INTRA EXTRACRAN SEL COM CAROTID INNOMINATE BILAT MOD SED  06/04/2020   IR ANGIO INTRA EXTRACRAN SEL INTERNAL CAROTID UNI L MOD SED  04/15/2019   IR ANGIO VERTEBRAL SEL VERTEBRAL BILAT MOD SED  01/05/2017   IR ANGIO VERTEBRAL SEL VERTEBRAL BILAT MOD SED  03/19/2019   IR ANGIO VERTEBRAL SEL VERTEBRAL UNI L MOD SED  06/04/2020   IR ANGIOGRAM FOLLOW UP STUDY  04/15/2019   IR RADIOLOGIST EVAL & MGMT  12/30/2016   IR TRANSCATH/EMBOLIZ  04/15/2019   IR US GUIDE VASC ACCESS RIGHT  03/19/2019   IR US GUIDE VASC ACCESS RIGHT  06/04/2020   MALONEY DILATION N/A 09/20/2018   Procedure: Venia Minks DILATION;  Surgeon: Daneil Dolin, MD;  [facility];  Service: Endoscopy;  Laterality: N/A;   RADIOLOGY WITH ANESTHESIA N/A 04/15/2019   Procedure: [employer];  Surgeon: Luanne Bras, MD;  Location: [facility];  Service: Radiology;  Laterality: N/A;   RIGHT/LEFT HEART CATH AND CORONARY ANGIOGRAPHY N/A  08/19/2019   Procedure: RIGHT/LEFT HEART CATH AND CORONARY ANGIOGRAPHY;  Surgeon: Larey Dresser, MD;  Location: [facility];  Service: Cardiovascular;  Laterality: N/A;   TUBAL LIGATION  1987   VAGINAL HYSTERECTOMY  2009   Social History   Occupational History   Occupation: CNA  Tobacco Use   Smoking status: Some Days    Packs/day: 0.10    Years: 15.00    Total pack years: 1.50    Types: Cigarettes   Smokeless tobacco: Never   Tobacco comments:    smokes a cigarette occasionally  Vaping Use   Vaping Use: Never used  Substance and Sexual Activity   Alcohol use: Yes    Comment: occasionally   Drug use: Not Currently    Types: Marijuana    Comment: former- 2017 last time   Sexual activity: Not Currently    Birth control/protection: Surgical    Comment: hyst

## 2022-04-04 ENCOUNTER — Telehealth: Payer: Self-pay | Admitting: Radiology

## 2022-04-04 DIAGNOSIS — G8929 Other chronic pain: Secondary | ICD-10-CM

## 2022-04-04 NOTE — Telephone Encounter (Signed)
Message from Dr. Karsten Ro needs bilateral shoulder MRI's, not MRI elbow. MRI left shoulder already entered.  New order for MRI right shoulder entered.

## 2022-04-10 ENCOUNTER — Other Ambulatory Visit (HOSPITAL_COMMUNITY): Payer: Self-pay | Admitting: Cardiology

## 2022-04-13 ENCOUNTER — Emergency Department (HOSPITAL_COMMUNITY): Payer: Medicaid Other

## 2022-04-13 ENCOUNTER — Other Ambulatory Visit: Payer: Self-pay

## 2022-04-13 ENCOUNTER — Encounter (HOSPITAL_COMMUNITY): Payer: Self-pay

## 2022-04-13 ENCOUNTER — Emergency Department (HOSPITAL_COMMUNITY)
Admission: EM | Admit: 2022-04-13 | Discharge: 2022-04-13 | Disposition: A | Discharge status: Home / Self Care | Payer: Medicaid Other | Attending: Emergency Medicine | Admitting: Emergency Medicine

## 2022-04-13 DIAGNOSIS — I10 Essential (primary) hypertension: Secondary | ICD-10-CM | POA: Insufficient documentation

## 2022-04-13 DIAGNOSIS — R079 Chest pain, unspecified: Secondary | ICD-10-CM | POA: Diagnosis not present

## 2022-04-13 DIAGNOSIS — Z87891 Personal history of nicotine dependence: Secondary | ICD-10-CM | POA: Diagnosis not present

## 2022-04-13 DIAGNOSIS — Z79899 Other long term (current) drug therapy: Secondary | ICD-10-CM | POA: Diagnosis not present

## 2022-04-13 DIAGNOSIS — F419 Anxiety disorder, unspecified: Secondary | ICD-10-CM | POA: Diagnosis not present

## 2022-04-13 DIAGNOSIS — R0789 Other chest pain: Secondary | ICD-10-CM | POA: Diagnosis not present

## 2022-04-13 DIAGNOSIS — Z7982 Long term (current) use of aspirin: Secondary | ICD-10-CM | POA: Insufficient documentation

## 2022-04-13 LAB — BASIC METABOLIC PANEL
Anion gap: 10 (ref 5–15)
BUN: 18 mg/dL (ref 6–20)
CO2: 25 mmol/L (ref 22–32)
Calcium: 9.2 mg/dL (ref 8.9–10.3)
Chloride: 103 mmol/L (ref 98–111)
Creatinine, Ser: 1.22 mg/dL — ABNORMAL HIGH (ref 0.44–1.00)
GFR, Estimated: 51 mL/min — ABNORMAL LOW (ref >60)
Glucose, Bld: 100 mg/dL — ABNORMAL HIGH (ref 70–99)
Potassium: 4 mmol/L (ref 3.5–5.1)
Sodium: 138 mmol/L (ref 135–145)

## 2022-04-13 LAB — CBC
HCT: 41 % (ref 36.0–46.0)
Hemoglobin: 13.6 g/dL (ref 12.0–15.0)
MCH: 29.1 pg (ref 26.0–34.0)
MCHC: 33.2 g/dL (ref 30.0–36.0)
MCV: 87.8 fL (ref 80.0–100.0)
Platelets: 339 10*3/uL (ref 150–400)
RBC: 4.67 MIL/uL (ref 3.87–5.11)
RDW: 15.7 % — ABNORMAL HIGH (ref 11.5–15.5)
WBC: 9.9 10*3/uL (ref 4.0–10.5)
nRBC: 0 % (ref 0.0–0.2)

## 2022-04-13 LAB — TROPONIN I (HIGH SENSITIVITY)
Troponin I (High Sensitivity): 6 ng/L (ref <18)
Troponin I (High Sensitivity): 5 ng/L (ref <18)

## 2022-04-13 MED ORDER — LORAZEPAM 1 MG PO TABS
1.0000 mg | ORAL_TABLET | Freq: Once | ORAL | Status: AC
  Administered 2022-04-13: 1 mg via ORAL
  Filled 2022-04-13: qty 1

## 2022-04-13 MED ORDER — ASPIRIN 81 MG PO CHEW
324.0000 mg | CHEWABLE_TABLET | Freq: Once | ORAL | Status: AC
  Administered 2022-04-13: 324 mg via ORAL
  Filled 2022-04-13: qty 4

## 2022-04-13 NOTE — ED Provider Notes (Signed)
[place] Provider Note   CSN: [id number] Arrival date & time: 04/13/22  1231     History  Chief Complaint  Patient presents with   Chest Pain    ROSETTA RUPNOW is a 61 y.o. female.   Chest Pain    This patient is a very pleasant 61 year old female with a history of high cholesterol, she has a history of hypertension, tobacco use, she had a myocardial infarction in 2017 and has had 2 stents, she takes her clopidogrel as well as [place].  She has a known history of congestive heart failure after prior myocardial infarction.  The patient has a history of anxiety but has been off of her medication for a month since her insurance got changed and she has not been able to see her usual doctor, she also has the stress of recently having an 83 year old grandchild who was on life support for 3 months without a known etiology until it was recently discovered that she had myasthenia gravis.  She had actually had to take his grandchild to the emergency department yesterday because of unresponsiveness and the child was transferred to [facility]  She states that she has been feeling off ever since.  She woke up this morning with a heaviness in her chest feeling of nausea, and occasional cough, the chest pressure and heaviness has been present ever since it started and has not gone away it is not positional or exertional and not associated with fevers or chills.  Her swelling in her legs is improved compared to her normal swelling.  Home Medications Prior to Admission medications   Medication Sig Start Date End Date Taking? Authorizing Provider  albuterol (VENTOLIN HFA) 108 (90 Base) MCG/ACT inhaler Inhale 2 puffs into the lungs every 4 (four) hours as needed for wheezing or shortness of breath. 04/28/21 04/28/22  [provider]  aspirin EC 81 MG tablet Take 81 mg by mouth every evening.     [provider]  atorvastatin (LIPITOR) 80 MG tablet TAKE 1 TABLET  BY MOUTH ONCE DAILY AT  [facility] 11/29/21   Larey Dresser, MD  carvedilol (COREG) 6.25 MG tablet Take 1 tablet (6.25 mg total) by mouth 2 (two) times daily. 05/13/21   Larey Dresser, MD  clopidogrel (PLAVIX) 75 MG tablet Take 1 tablet (75 mg total) by mouth daily. 05/13/21   Larey Dresser, MD  dapagliflozin propanediol (FARXIGA) 10 MG TABS tablet Take 5 mg by mouth daily.    [provider]  EPINEPHrine (EPIPEN 2-PAK) 0.3 mg/0.3 mL IJ SOAJ injection Inject 0.3 mg into the muscle as needed for anaphylaxis. 04/23/21   Valentina Shaggy, MD  ezetimibe (ZETIA) 10 MG tablet Take 1 tablet by mouth once daily 04/11/22   Larey Dresser, MD  furosemide (LASIX) 20 MG tablet Take 40 mg daily alternating with 20 mg daily 08/11/21   Rafael Bihari, FNP  gabapentin (NEURONTIN) 600 MG tablet Take 300 mg by mouth daily. 04/30/21   [provider]  icosapent Ethyl ([name]) 1 g capsule Take 2 capsules (2 g total) by mouth 2 (two) times daily. 03/07/22   Larey Dresser, MD  loperamide (IMODIUM) 2 MG capsule Take 2 mg by mouth 4 (four) times daily as needed for diarrhea or loose stools (ibs).    [provider]  losartan (COZAAR) 25 MG tablet Take 1 tablet by mouth once daily 03/21/22   Larey Dresser, MD  meclizine (ANTIVERT) 25 MG  tablet Take 1 tablet (25 mg total) by mouth 3 (three) times daily as needed for dizziness. 05/15/21   Milton Ferguson, MD  Multiple Vitamins-Minerals (MULTIVITAMIN WITH MINERALS) tablet Take 1 tablet by mouth daily.    [provider]  nitroGLYCERIN (NITROSTAT) 0.4 MG SL tablet Place 1 tablet (0.4 mg total) under the tongue every 5 (five) minutes x 3 doses as needed for chest pain (if no relief after 2nd dose, proceed to the ED for an evaluation or call 911). 09/01/20   Verta Ellen., NP  ondansetron (ZOFRAN) 4 MG tablet Take 4 mg by mouth 3 (three) times daily as needed for nausea or vomiting. 03/04/21   [provider]  pantoprazole  (PROTONIX) 40 MG tablet Take 1 tablet (40 mg total) by mouth daily. 05/07/21   Rourk, Cristopher Estimable, MD  potassium chloride SA (KLOR-CON M) 20 MEQ tablet Take 2 tablets (40 mEq total) by mouth daily. 03/17/22   Larey Dresser, MD  rOPINIRole (REQUIP) 0.5 MG tablet Take 0.5 mg by mouth at bedtime.  11/13/17   [provider]  spironolactone (ALDACTONE) 25 MG tablet Take 1 tablet (25 mg total) by mouth at bedtime. 03/07/22   Larey Dresser, MD  Vitamin D, Ergocalciferol, (DRISDOL) 1.25 MG (50000 UNIT) CAPS capsule Take 50,000 Units by mouth once a week. Takes on Mondays. 12/07/21   [provider]      Allergies    Other, Alpha-gal, and Tape    Review of Systems   Review of Systems  Cardiovascular:  Positive for chest pain.  All other systems reviewed and are negative.   Physical Exam Updated Vital Signs BP 102/73   Pulse 96   Temp 98.1 F (36.7 C) (Oral)   Resp 16   Ht 1.524 m (5')   Wt 57.6 kg   SpO2 94%   BMI 24.80 kg/m  Physical Exam Vitals and nursing note reviewed.  Constitutional:      General: She is not in acute distress.    Appearance: She is well-developed.  HENT:     Head: Normocephalic and atraumatic.     Mouth/Throat:     Pharynx: No oropharyngeal exudate.  Eyes:     General: No scleral icterus.       Right eye: No discharge.        Left eye: No discharge.     Conjunctiva/sclera: Conjunctivae normal.     Pupils: Pupils are equal, round, and reactive to light.  Neck:     Thyroid: No thyromegaly.     Vascular: No JVD.  Cardiovascular:     Rate and Rhythm: Normal rate and regular rhythm.     Heart sounds: Normal heart sounds. No murmur heard.    No friction rub. No gallop.  Pulmonary:     Effort: Pulmonary effort is normal. No respiratory distress.     Breath sounds: Normal breath sounds. No wheezing or rales.  Abdominal:     General: Bowel sounds are normal. There is no distension.     Palpations: Abdomen is soft. There is no mass.      Tenderness: There is no abdominal tenderness.  Musculoskeletal:        General: No tenderness. Normal range of motion.     Cervical back: Normal range of motion and neck supple.     Right lower leg: No edema.     Left lower leg: No edema.  Lymphadenopathy:     Cervical: No cervical adenopathy.  Skin:    General: Skin is warm and dry.     Findings: No erythema or rash.  Neurological:     Mental Status: She is alert.     Coordination: Coordination normal.  Psychiatric:        Behavior: Behavior normal.     ED Results / Procedures / Treatments   Labs (all labs ordered are listed, but only abnormal results are displayed) Labs Reviewed  BASIC METABOLIC PANEL - Abnormal; Notable for the following components:      Result Value   Glucose, Bld 100 (*)    Creatinine, Ser 1.22 (*)    GFR, Estimated 51 (*)    All other components within normal limits  CBC - Abnormal; Notable for the following components:   RDW 15.7 (*)    All other components within normal limits  TROPONIN I (HIGH SENSITIVITY)  TROPONIN I (HIGH SENSITIVITY)    EKG EKG Interpretation  Date/Time:  Wednesday April 13 2022 12:54:22 EST Ventricular Rate:  99 PR Interval:  126 QRS Duration: 88 QT Interval:  338 QTC Calculation: 433 R Axis:   6 Text Interpretation: Normal sinus rhythm Possible Left atrial enlargement Low voltage QRS Cannot rule out Anterior infarct , age undetermined Abnormal ECG When compared with ECG of 07-Mar-2022 09:43, Nonspecific T wave abnormality now evident in Inferior leads Confirmed by Noemi Chapel [phone]) on 04/13/2022 2:15:31 PM  Radiology DG Chest 2 View  Result Date: 04/13/2022 CLINICAL DATA:  Chest pain EXAM: CHEST - 2 VIEW COMPARISON:  CXR 02/28/22 FINDINGS: Left-sided single lead cardiac device in place with unchanged positioning. No pleural effusion. No pneumothorax. No focal airspace opacity. Unchanged cardiac and mediastinal contours. Surgical clips in the right upper quadrant.  No displaced rib fractures. IMPRESSION: No active cardiopulmonary disease. Electronically Signed   By: Marin Roberts M.D.   On: 04/13/2022 13:21    Procedures Procedures    Medications Ordered in ED Medications  LORazepam (ATIVAN) tablet 1 mg (has no administration in time range)  aspirin chewable tablet 324 mg (has no administration in time range)    ED Course/ Medical Decision Making/ A&P                           Medical Decision Making Amount and/or Complexity of Data Reviewed Labs: ordered. Radiology: ordered.  Risk OTC drugs. Prescription drug management.   \This patient presents to the ED for concern of chest pain, this involves an extensive number of treatment options, and is a complaint that carries with it a high risk of complications and morbidity.  The differential diagnosis includes coronary disease, anxiety, pneumothorax as the patient does have a history of smoking, this could be pneumonia, pericarditis or myocarditis though seem less likely   Co morbidities that complicate the patient evaluation  As above the patient has hypertension heart failure history of obstructive coronary disease and stenting   Additional history obtained:  Additional history obtained from electronic medical record External records from outside source obtained and reviewed including prior echocardiogram report from November 2022 1 year ago.  This showed that she had an ejection fraction of 30 to 35% with grade 1 diastolic dysfunction.  She does have a pacer   Lab Tests:  I Ordered, and personally interpreted labs.  The pertinent results include: CBC metabolic panel and troponin all are rather unremarkable, this includes a second troponin   Imaging Studies ordered:  I ordered imaging studies including chest x-ray  I independently visualized and interpreted imaging which showed no acute findings including pneumonia or pneumothorax I agree with the radiologist  interpretation   Cardiac Monitoring: / EKG:  The patient was maintained on a cardiac monitor.  I personally viewed and interpreted the cardiac monitored which showed an underlying rhythm of: Normal sinus rhythm   Consultations Obtained:  I requested consultation with the no consultations needed,  and discussed lab and imaging findings as well as pertinent plan - they recommend: None   Problem List / ED Course / Critical interventions / Medication management  Patient is overall well-appearing, vital signs are unremarkable, she is not hypertensive febrile hypoxic and has labs which are unremarkable including 2 negative troponins.  Patient proceeded to be informed about needed for close follow-up however given symptoms that been going on for well over 6 hours and 2 negative troponins it makes this extremely unlikely to be related to her heart.  Much more likely to be related to underlying anxiety given her life circumstances.  The patient is agreeable to return should symptoms worsen.  Social Determinants of Health:  Tob Use   Test / Admission - Considered:  Stated admission however patient has negative workup, unremarkable, unlikely to be related to pulmonary embolism, myocarditis, pericarditis or pneumothorax Patient updated on results and understands the plan, agreeable         Final Clinical Impression(s) / ED Diagnoses Final diagnoses:  Chest heaviness    Rx / DC Orders ED Discharge Orders     None         Noemi Chapel, MD 04/13/22 1559

## 2022-04-13 NOTE — Discharge Instructions (Signed)
Your testing today shows no signs of abnormal heart function at all, this is very reassuring however if you do develop severe or worsening chest pain I want you to return to the emergency department.  Your x-ray and blood work were all normal, make sure you are taking your home medications including the medicine for blood thinning called clopidogrel.  If symptoms worsen return to the ER immediately.

## 2022-04-13 NOTE — ED Triage Notes (Signed)
Pt presents to ED with complaints of mid chest pain since this am, feels like squeezing. Pt states she took 3 nitro without relief. Pt states she is also having headache, nausea, dizziness, shortness of breath, all started at 0710, BP this am was 84/76. Pt last had nitro 1150

## 2022-04-18 ENCOUNTER — Ambulatory Visit: Payer: 59 | Admitting: Cardiology

## 2022-04-18 ENCOUNTER — Telehealth: Payer: Self-pay | Admitting: Orthopaedic Surgery

## 2022-04-18 NOTE — Telephone Encounter (Signed)
Messaged [facility] with [facility] schduling-they will contact pt to schedule appt

## 2022-04-18 NOTE — Telephone Encounter (Signed)
Patient states she has been waiting on a MRI rescheduling due to she has a pacemaker and can't have it done anywhere but the hospital. Please [phone]

## 2022-04-20 ENCOUNTER — Encounter: Payer: Self-pay | Admitting: Gastroenterology

## 2022-04-20 ENCOUNTER — Ambulatory Visit (INDEPENDENT_AMBULATORY_CARE_PROVIDER_SITE_OTHER): Payer: 59 | Admitting: Gastroenterology

## 2022-04-20 VITALS — BP 105/68 | HR 80 | Temp 98.1°F | Ht 60.0 in | Wt 131.8 lb

## 2022-04-20 DIAGNOSIS — R1013 Epigastric pain: Secondary | ICD-10-CM | POA: Insufficient documentation

## 2022-04-20 DIAGNOSIS — R1310 Dysphagia, unspecified: Secondary | ICD-10-CM | POA: Diagnosis not present

## 2022-04-20 MED ORDER — SUCRALFATE 1 GM/10ML PO SUSP: 1.0000 g | Freq: Three times a day (TID) | ORAL | 0 refills | Status: DC

## 2022-04-20 MED ORDER — PANTOPRAZOLE SODIUM 40 MG PO TBEC: 40.0000 mg | DELAYED_RELEASE_TABLET | Freq: Two times a day (BID) | ORAL | 3 refills | Status: DC

## 2022-04-20 NOTE — Progress Notes (Signed)
GI Office Note    Referring Provider: [name], MD Primary Care Physician:  [name], MD  Primary Gastroenterologist: Garfield Cornea, MD   Chief Complaint   Chief Complaint  Patient presents with   Dysphagia    States that when she eats or drinks it feels like it is stabbing her in the middle of her chest. Been going on since last Wednesday.     History of Present Illness   [name] is a 61 y.o. female presenting today for one week history of odynophagia.   Last Wednesday she noted postprandial stabbing pain in the center of her chest. Happens with swallowing liquids or solids. Pain will radiate down into the epigastric region. She feels like everything stops there. It will take a couple of hours before she gets relief. Not able to eat much. About 30 minutes after eating, she feels nauseated. No vomiting. Tried Pepto/TUMS with no relief. BMs usually loose, baseline IBS. On good days she has 2 stools, on other days, has 4-5 stools. No melena, brbpr.   Still avoids beef, pork due to Alpha Gal. Does not eat a lot of dairy usually. Denies recent steroids, rarely uses her inhalers, no recent antibiotics.   She has been under a lot of stress. Her daughter passed away in 07/10/2021. She if fighting for custody of her grandchild. Also her 48 y/o granddaughter diagnosed with Myathenia Gravis and required prolonged hospitalization. Still with significant limitations.   GES 05/2021: normal  EGD in August 2022 for nausea and vomiting: -Mild, nonspecific distal gastritis.  Biopsies taken to check for H. pylori, were positive (treated with 10-day course of Pylera equivalent with confirmed eradication by stool Ag) -The examination was overall normal   Colonoscopy May 2020 for diarrhea: -Submucosal nodule in the ascending colon consistent with lipoma -Distal 5 cm of terminal ileum normal -Segmental right and left colon biopsies negative for microscopic colitis -Next colonoscopy in 10  years for screening purposes     Medications   Current Outpatient Medications  Medication Sig Dispense Refill   albuterol (VENTOLIN HFA) 108 (90 Base) MCG/ACT inhaler Inhale 2 puffs into the lungs every 4 (four) hours as needed for wheezing or shortness of breath.     aspirin EC 81 MG tablet Take 81 mg by mouth every evening.      atorvastatin (LIPITOR) 80 MG tablet TAKE 1 TABLET BY MOUTH ONCE DAILY AT  6PM 90 tablet 3   carvedilol (COREG) 6.25 MG tablet Take 1 tablet (6.25 mg total) by mouth 2 (two) times daily. 180 tablet 1   clopidogrel (PLAVIX) 75 MG tablet Take 1 tablet (75 mg total) by mouth daily. 90 tablet 3   dapagliflozin propanediol (FARXIGA) 10 MG TABS tablet Take 5 mg by mouth daily.     EPINEPHrine (EPIPEN 2-PAK) 0.3 mg/0.3 mL IJ SOAJ injection Inject 0.3 mg into the muscle as needed for anaphylaxis. 2 each 2   ezetimibe (ZETIA) 10 MG tablet Take 1 tablet by mouth once daily 30 tablet 11   furosemide (LASIX) 20 MG tablet Take 40 mg daily alternating with 20 mg daily 180 tablet 3   gabapentin (NEURONTIN) 600 MG tablet Take 300 mg by mouth daily.     icosapent Ethyl ([name]) 1 g capsule Take 2 capsules (2 g total) by mouth 2 (two) times daily. 120 capsule 11   loperamide (IMODIUM) 2 MG capsule Take 2 mg by mouth 4 (four) times daily as needed for diarrhea or  loose stools (ibs).     losartan (COZAAR) 25 MG tablet Take 1 tablet by mouth once daily 30 tablet 6   Multiple Vitamins-Minerals (MULTIVITAMIN WITH MINERALS) tablet Take 1 tablet by mouth daily.     nitroGLYCERIN (NITROSTAT) 0.4 MG SL tablet Place 1 tablet (0.4 mg total) under the tongue every 5 (five) minutes x 3 doses as needed for chest pain (if no relief after 2nd dose, proceed to the ED for an evaluation or call 911). 25 tablet 2   pantoprazole (PROTONIX) 40 MG tablet Take 1 tablet (40 mg total) by mouth daily. 30 tablet 11   potassium chloride SA (KLOR-CON M) 20 MEQ tablet Take 2 tablets (40 mEq total) by mouth daily.  60 tablet 6   rOPINIRole (REQUIP) 0.5 MG tablet Take 0.5 mg by mouth at bedtime.   0   spironolactone (ALDACTONE) 25 MG tablet Take 1 tablet (25 mg total) by mouth at bedtime. 90 tablet 3   Vitamin D, Ergocalciferol, (DRISDOL) 1.25 MG (50000 UNIT) CAPS capsule Take 50,000 Units by mouth once a week. Takes on Mondays.     No current facility-administered medications for this visit.    Allergies   Allergies as of 04/20/2022 - Review Complete 04/13/2022  Allergen Reaction Noted   Other Shortness Of Breath, Diarrhea, Nausea And Vomiting, and Nausea Only 03/02/2021   Alpha-gal  02/28/2022   Tape Other (See Comments) 01/03/2017     Past Medical History   Past Medical History:  Diagnosis Date   Allergic reaction to alpha-gal    Anxiety state 03/25/2016   Basal cell carcinoma of forehead    Brain aneurysm    CHF (congestive heart failure) ([place])    Coronary artery disease    a. 03/11/16 PCI with DES-->Prox/Mid Cx;  b. 03/14/16 PCI with DES x2-->RCA, EF 30-35%.   Essential hypertension    GERD (gastroesophageal reflux disease)    HFrEF (heart failure with reduced ejection fraction) ([place])    a. 10/2016 Echo: EF 35-40%, Gr1 DD, mild focal basal septal hypertrophy, basal inflat, mid inflat, basal antlat AK. Mid infept/inf/antlat, apical lateral sev HK. Mod MR. mildly reduced RV fxn. Mild TR.   History of pneumonia    Hyperlipidemia    IBS (irritable bowel syndrome)    Ischemic cardiomyopathy    a. 10/2016 Echo: EF 35-40%, Gr1 DD.   Mitral regurgitation    NSTEMI (non-ST elevated myocardial infarction) ([place]) 03/10/2016   Pneumonia 03/2016   Shingles    Squamous cell cancer of skin of nose    Thrombocytosis 03/26/2016   Tobacco abuse    Trichimoniasis    Wears dentures    Wears glasses     Past Surgical History   Past Surgical History:  Procedure Laterality Date   APPENDECTOMY     BIOPSY  09/20/2018   Procedure: BIOPSY;  Surgeon: Daneil Dolin, MD;  [facility];   Service: Endoscopy;;  colon   BIOPSY  01/05/2021   Procedure: BIOPSY;  Surgeon: Milus Banister, MD;  Location: [facility] ENDOSCOPY;  Service: Endoscopy;;   CARDIAC CATHETERIZATION N/A 03/11/2016   Procedure: Left Heart Cath and Coronary Angiography;  Surgeon: Leonie Man, MD;  Location: [place] [facility];  Service: Cardiovascular;  Laterality: N/A;   CARDIAC CATHETERIZATION N/A 03/11/2016   Procedure: Coronary Stent Intervention;  Surgeon: Leonie Man, MD;  Location: [place] [facility];  Service: Cardiovascular;  Laterality: N/A;   CARDIAC CATHETERIZATION N/A 03/14/2016   Procedure: Coronary Stent  Intervention;  Surgeon: Peter M Martinique, MD;  Location: [place] [facility];  Service: Cardiovascular;  Laterality: N/A;   CHOLECYSTECTOMY OPEN  1984   COLONOSCOPY WITH PROPOFOL N/A 09/20/2018   Procedure: COLONOSCOPY WITH PROPOFOL;  Surgeon: Daneil Dolin, MD;  [facility];  Service: Endoscopy;  Laterality: N/A;  10:30am   CORONARY ANGIOPLASTY WITH STENT PLACEMENT  03/14/2016   ESOPHAGOGASTRODUODENOSCOPY (EGD) WITH PROPOFOL N/A 09/20/2018   Procedure: ESOPHAGOGASTRODUODENOSCOPY (EGD) WITH PROPOFOL;  Surgeon: Daneil Dolin, MD;  [facility];  Service: Endoscopy;  Laterality: N/A;   ESOPHAGOGASTRODUODENOSCOPY (EGD) WITH PROPOFOL N/A 01/05/2021   Procedure: ESOPHAGOGASTRODUODENOSCOPY (EGD) WITH PROPOFOL;  Surgeon: Milus Banister, MD;  Location: [facility] ENDOSCOPY;  Service: Endoscopy;  Laterality: N/A;   FINGER ARTHROPLASTY Left 05/14/2013   Procedure: LEFT THUMB CARPAL METACARPAL ARTHROPLASTY;  Surgeon: Tennis Must, MD;  Location: [place];  Service: Orthopedics;  Laterality: Left;   ICD IMPLANT N/A 04/03/2020   Procedure: ICD IMPLANT;  Surgeon: Constance Haw, MD;  Location: [place] [facility];  Service: Cardiovascular;  Laterality: N/A;   IR ANGIO INTRA EXTRACRAN SEL COM CAROTID INNOMINATE BILAT MOD SED  01/05/2017   IR ANGIO INTRA EXTRACRAN SEL COM CAROTID  INNOMINATE BILAT MOD SED  03/19/2019   IR ANGIO INTRA EXTRACRAN SEL COM CAROTID INNOMINATE BILAT MOD SED  06/04/2020   IR ANGIO INTRA EXTRACRAN SEL INTERNAL CAROTID UNI L MOD SED  04/15/2019   IR ANGIO VERTEBRAL SEL VERTEBRAL BILAT MOD SED  01/05/2017   IR ANGIO VERTEBRAL SEL VERTEBRAL BILAT MOD SED  03/19/2019   IR ANGIO VERTEBRAL SEL VERTEBRAL UNI L MOD SED  06/04/2020   IR ANGIOGRAM FOLLOW UP STUDY  04/15/2019   IR RADIOLOGIST EVAL & MGMT  12/30/2016   IR TRANSCATH/EMBOLIZ  04/15/2019   IR US GUIDE VASC ACCESS RIGHT  03/19/2019   IR US GUIDE VASC ACCESS RIGHT  06/04/2020   MALONEY DILATION N/A 09/20/2018   Procedure: Venia Minks DILATION;  Surgeon: Daneil Dolin, MD;  [facility];  Service: Endoscopy;  Laterality: N/A;   RADIOLOGY WITH ANESTHESIA N/A 04/15/2019   Procedure: [employer];  Surgeon: Luanne Bras, MD;  Location: [place];  Service: Radiology;  Laterality: N/A;   RIGHT/LEFT HEART CATH AND CORONARY ANGIOGRAPHY N/A 08/19/2019   Procedure: RIGHT/LEFT HEART CATH AND CORONARY ANGIOGRAPHY;  Surgeon: Larey Dresser, MD;  Location: [place] [facility];  Service: Cardiovascular;  Laterality: N/A;   TUBAL LIGATION  1987   VAGINAL HYSTERECTOMY  2009    Past Family History   Family History  Problem Relation Age of Onset   Stroke Mother    Hypertension Mother    Diabetes Mother    Heart attack Mother    Heart attack Father    Diabetes Father    Hypertension Father    CAD Father    Colon polyps Father 63       pre-cancerous    Stroke Father    Dementia Father    Hyperlipidemia Father    Breast cancer Maternal Grandmother    Cancer Maternal Grandfather        Tongue and esophageal   Heart failure Other    Colon cancer Neg Hx     Past Social History   Social History   Socioeconomic History   Marital status: Married    Spouse name: Not on file   Number of children: Not on file   Years of education: Not on file  Highest education level: Not on file   Occupational History   Occupation: CNA  Tobacco Use   Smoking status: Some Days    Packs/day: 0.10    Years: 15.00    Total pack years: 1.50    Types: Cigarettes   Smokeless tobacco: Never   Tobacco comments:    smokes a cigarette occasionally  Vaping Use   Vaping Use: Never used  Substance and Sexual Activity   Alcohol use: Yes    Comment: occasionally   Drug use: Not Currently    Types: Marijuana    Comment: former- 2017 last time   Sexual activity: Not Currently    Birth control/protection: Surgical    Comment: hyst  Other Topics Concern   Not on file  Social History Narrative   Lives with husband in [place] in a one story home with a basement.  Has 4 children.  Works as a Quarry manager.  Education: CNA school.    Social Determinants of Health   Financial Resource Strain: Low Risk  (06/11/2021)   Overall Financial Resource Strain (CARDIA)    Difficulty of Paying Living Expenses: Not hard at all  Food Insecurity: No Food Insecurity (06/11/2021)   Hunger Vital Sign    Worried About Running Out of Food in the Last Year: Never true    Ran Out of Food in the Last Year: Never true  Transportation Needs: No Transportation Needs (06/11/2021)   PRAPARE - Hydrologist (Medical): No    Lack of Transportation (Non-Medical): No  Physical Activity: Insufficiently Active (06/11/2021)   Exercise Vital Sign    Days of Exercise per Week: 4 days    Minutes of Exercise per Session: 30 min  Stress: Stress Concern Present (06/11/2021)   [place]    Feeling of Stress : Very much  Social Connections: Socially Isolated (06/11/2021)   Social Connection and Isolation Panel [NHANES]    Frequency of Communication with Friends and Family: Once a week    Frequency of Social Gatherings with Friends and Family: Never    Attends Religious Services: Never    Marine scientist or Organizations: No    Attends Theatre manager Meetings: Never    Marital Status: Married  Human resources officer Violence: Not At Risk (06/11/2021)   Humiliation, Afraid, Rape, and Kick questionnaire    Fear of Current or Ex-Partner: No    Emotionally Abused: No    Physically Abused: No    Sexually Abused: No    Review of Systems   General: Negative for anorexia, weight loss, fever, chills, fatigue, weakness. ENT: Negative for hoarseness, difficulty swallowing , nasal congestion. See hpi CV: Negative for chest pain, angina, palpitations, dyspnea on exertion, peripheral edema.  Respiratory: Negative for dyspnea at rest, dyspnea on exertion, cough, sputum, wheezing.  GI: See history of present illness. GU:  Negative for dysuria, hematuria, urinary incontinence, urinary frequency, nocturnal urination.  Endo: Negative for unusual weight change.     Physical Exam   BP 105/68 (BP Location: Right Arm, Patient Position: Sitting, Cuff Size: Normal)   Pulse 80   Temp 98.1 F (36.7 C) (Oral)   Ht 5' (1.524 m)   Wt 131 lb 12.8 oz (59.8 kg)   SpO2 97%   BMI 25.74 kg/m    General: Well-nourished, well-developed in no acute distress.  Eyes: No icterus. Mouth: Oropharyngeal mucosa moist and pink , no lesions erythema or exudate. Lungs:  Clear to auscultation bilaterally.  Heart: Regular rate and rhythm, no murmurs rubs or gallops.  Abdomen: Bowel sounds are normal, nontender, nondistended, no hepatosplenomegaly or masses,  no abdominal bruits or hernia , no rebound or guarding.  Rectal: not performed Extremities: No lower extremity edema. No clubbing or deformities. Neuro: Alert and oriented x 4   Skin: Warm and dry, no jaundice.   Psych: Alert and cooperative, normal mood and affect.  Labs    Lab Results  Component Value Date   CREATININE 1.22 (H) 04/13/2022   BUN 18 04/13/2022   NA 138 04/13/2022   K 4.0 04/13/2022   CL 103 04/13/2022   CO2 25 04/13/2022   Lab Results  Component Value Date   ALT 19 09/09/2021    AST 20 09/09/2021   ALKPHOS 94 09/09/2021   BILITOT 0.4 09/09/2021   Lab Results  Component Value Date   WBC 9.9 04/13/2022   HGB 13.6 04/13/2022   HCT 41.0 04/13/2022   MCV 87.8 04/13/2022   PLT 339 04/13/2022    Imaging Studies   DG Chest 2 View  Result Date: 04/13/2022 CLINICAL DATA:  Chest pain EXAM: CHEST - 2 VIEW COMPARISON:  CXR 02/28/22 FINDINGS: Left-sided single lead cardiac device in place with unchanged positioning. No pleural effusion. No pneumothorax. No focal airspace opacity. Unchanged cardiac and mediastinal contours. Surgical clips in the right upper quadrant. No displaced rib fractures. IMPRESSION: No active cardiopulmonary disease. Electronically Signed   By: Marin Roberts M.D.   On: 04/13/2022 13:21    Assessment   Odynophagia/epigastric pain: acute onset symptoms with postprandial pain in center of chest radiating into epigastric region. EGD 2022 with H.pylori gastritis s/p treatment and confirmed eradication. Acute onset odynophagia could be secondary to esophagitis, such as pill induce, viral, candida, less likely due to GERD with chronic PPI use.    PLAN   Start carafate 1 gram qac/qhs. Increase pantoprazole to '40mg'$  BID before meals.  Discussed with Dr. Gala Romney who advises to expedite EGD with first available spot/any provider. ASA 3/4.  I have discussed the risks, alternatives, benefits with regards to but not limited to the risk of reaction to medication, bleeding, infection, perforation and the patient is agreeable to proceed. Written consent to be obtained.    Laureen Ochs. Bobby Rumpf, [place], [facility]

## 2022-04-20 NOTE — Patient Instructions (Signed)
Start carafate before breakfast, lunch, supper and at bedtime.  Increase pantoprazole to twice daily before breakfast and supper.  I will discuss possible upper endoscopy with Dr. Gala Romney. We will be in touch in the next 24 hours.

## 2022-04-20 NOTE — H&P (View-Only) (Signed)
GI Office Note    Referring Provider: [name], MD Primary Care Physician:  [name], MD  Primary Gastroenterologist: Garfield Cornea, MD   Chief Complaint   Chief Complaint  Patient presents with   Dysphagia    States that when she eats or drinks it feels like it is stabbing her in the middle of her chest. Been going on since last Wednesday.     History of Present Illness   Erin [name] is a 61 y.o. female presenting today for one week history of odynophagia.   Last Wednesday she noted postprandial stabbing pain in the center of her chest. Happens with swallowing liquids or solids. Pain will radiate down into the epigastric region. She feels like everything stops there. It will take a couple of hours before she gets relief. Not able to eat much. About 30 minutes after eating, she feels nauseated. No vomiting. Tried Pepto/TUMS with no relief. BMs usually loose, baseline IBS. On good days she has 2 stools, on other days, has 4-5 stools. No melena, brbpr.   Still avoids beef, pork due to Alpha Gal. Does not eat a lot of dairy usually. Denies recent steroids, rarely uses her inhalers, no recent antibiotics.   She has been under a lot of stress. Her daughter passed away in Jul 10, 2021. She if fighting for custody of her grandchild. Also her 53 y/o granddaughter diagnosed with Myathenia Gravis and required prolonged hospitalization. Still with significant limitations.   GES 05/2021: normal  EGD in August 2022 for nausea and vomiting: -Mild, nonspecific distal gastritis.  Biopsies taken to check for H. pylori, were positive (treated with 10-day course of Pylera equivalent with confirmed eradication by stool Ag) -The examination was overall normal   Colonoscopy May 2020 for diarrhea: -Submucosal nodule in the ascending colon consistent with lipoma -Distal 5 cm of terminal ileum normal -Segmental right and left colon biopsies negative for microscopic colitis -Next colonoscopy in 10  years for screening purposes     Medications   Current Outpatient Medications  Medication Sig Dispense Refill   albuterol (VENTOLIN HFA) 108 (90 Base) MCG/ACT inhaler Inhale 2 puffs into the lungs every 4 (four) hours as needed for wheezing or shortness of breath.     aspirin EC 81 MG tablet Take 81 mg by mouth every evening.      atorvastatin (LIPITOR) 80 MG tablet TAKE 1 TABLET BY MOUTH ONCE DAILY AT  6PM 90 tablet 3   carvedilol (COREG) 6.25 MG tablet Take 1 tablet (6.25 mg total) by mouth 2 (two) times daily. 180 tablet 1   clopidogrel (PLAVIX) 75 MG tablet Take 1 tablet (75 mg total) by mouth daily. 90 tablet 3   dapagliflozin propanediol (FARXIGA) 10 MG TABS tablet Take 5 mg by mouth daily.     EPINEPHrine (EPIPEN 2-PAK) 0.3 mg/0.3 mL IJ SOAJ injection Inject 0.3 mg into the muscle as needed for anaphylaxis. 2 each 2   ezetimibe (ZETIA) 10 MG tablet Take 1 tablet by mouth once daily 30 tablet 11   furosemide (LASIX) 20 MG tablet Take 40 mg daily alternating with 20 mg daily 180 tablet 3   gabapentin (NEURONTIN) 600 MG tablet Take 300 mg by mouth daily.     icosapent Ethyl ([name]) 1 g capsule Take 2 capsules (2 g total) by mouth 2 (two) times daily. 120 capsule 11   loperamide (IMODIUM) 2 MG capsule Take 2 mg by mouth 4 (four) times daily as needed for diarrhea or  loose stools (ibs).     losartan (COZAAR) 25 MG tablet Take 1 tablet by mouth once daily 30 tablet 6   Multiple Vitamins-Minerals (MULTIVITAMIN WITH MINERALS) tablet Take 1 tablet by mouth daily.     nitroGLYCERIN (NITROSTAT) 0.4 MG SL tablet Place 1 tablet (0.4 mg total) under the tongue every 5 (five) minutes x 3 doses as needed for chest pain (if no relief after 2nd dose, proceed to the ED for an evaluation or call 911). 25 tablet 2   pantoprazole (PROTONIX) 40 MG tablet Take 1 tablet (40 mg total) by mouth daily. 30 tablet 11   potassium chloride SA (KLOR-CON M) 20 MEQ tablet Take 2 tablets (40 mEq total) by mouth daily.  60 tablet 6   rOPINIRole (REQUIP) 0.5 MG tablet Take 0.5 mg by mouth at bedtime.   0   spironolactone (ALDACTONE) 25 MG tablet Take 1 tablet (25 mg total) by mouth at bedtime. 90 tablet 3   Vitamin D, Ergocalciferol, (DRISDOL) 1.25 MG (50000 UNIT) CAPS capsule Take 50,000 Units by mouth once a week. Takes on Mondays.     No current facility-administered medications for this visit.    Allergies   Allergies as of 04/20/2022 - Review Complete 04/13/2022  Allergen Reaction Noted   Other Shortness Of Breath, Diarrhea, Nausea And Vomiting, and Nausea Only 03/02/2021   Alpha-gal  02/28/2022   Tape Other (See Comments) 01/03/2017     Past Medical History   Past Medical History:  Diagnosis Date   Allergic reaction to alpha-gal    Anxiety state 03/25/2016   Basal cell carcinoma of forehead    Brain aneurysm    CHF (congestive heart failure) ([place])    Coronary artery disease    a. 03/11/16 PCI with DES-->Prox/Mid Cx;  b. 03/14/16 PCI with DES x2-->RCA, EF 30-35%.   Essential hypertension    GERD (gastroesophageal reflux disease)    HFrEF (heart failure with reduced ejection fraction) ([place])    a. 10/2016 Echo: EF 35-40%, Gr1 DD, mild focal basal septal hypertrophy, basal inflat, mid inflat, basal antlat AK. Mid infept/inf/antlat, apical lateral sev HK. Mod MR. mildly reduced RV fxn. Mild TR.   History of pneumonia    Hyperlipidemia    IBS (irritable bowel syndrome)    Ischemic cardiomyopathy    a. 10/2016 Echo: EF 35-40%, Gr1 DD.   Mitral regurgitation    NSTEMI (non-ST elevated myocardial infarction) ([place]) 03/10/2016   Pneumonia 03/2016   Shingles    Squamous cell cancer of skin of nose    Thrombocytosis 03/26/2016   Tobacco abuse    Trichimoniasis    Wears dentures    Wears glasses     Past Surgical History   Past Surgical History:  Procedure Laterality Date   APPENDECTOMY     BIOPSY  09/20/2018   Procedure: BIOPSY;  Surgeon: Daneil Dolin, MD;  [facility];   Service: Endoscopy;;  colon   BIOPSY  01/05/2021   Procedure: BIOPSY;  Surgeon: Milus Banister, MD;  Location: [facility] ENDOSCOPY;  Service: Endoscopy;;   CARDIAC CATHETERIZATION N/A 03/11/2016   Procedure: Left Heart Cath and Coronary Angiography;  Surgeon: Leonie Man, MD;  Location: [place] CV LAB;  Service: Cardiovascular;  Laterality: N/A;   CARDIAC CATHETERIZATION N/A 03/11/2016   Procedure: Coronary Stent Intervention;  Surgeon: Leonie Man, MD;  Location: [place] CV LAB;  Service: Cardiovascular;  Laterality: N/A;   CARDIAC CATHETERIZATION N/A 03/14/2016   Procedure: Coronary Stent  Intervention;  Surgeon: Peter M Martinique, MD;  Location: [place] CV LAB;  Service: Cardiovascular;  Laterality: N/A;   CHOLECYSTECTOMY OPEN  1984   COLONOSCOPY WITH PROPOFOL N/A 09/20/2018   Procedure: COLONOSCOPY WITH PROPOFOL;  Surgeon: Daneil Dolin, MD;  [facility];  Service: Endoscopy;  Laterality: N/A;  10:30am   CORONARY ANGIOPLASTY WITH STENT PLACEMENT  03/14/2016   ESOPHAGOGASTRODUODENOSCOPY (EGD) WITH PROPOFOL N/A 09/20/2018   Procedure: ESOPHAGOGASTRODUODENOSCOPY (EGD) WITH PROPOFOL;  Surgeon: Daneil Dolin, MD;  [facility];  Service: Endoscopy;  Laterality: N/A;   ESOPHAGOGASTRODUODENOSCOPY (EGD) WITH PROPOFOL N/A 01/05/2021   Procedure: ESOPHAGOGASTRODUODENOSCOPY (EGD) WITH PROPOFOL;  Surgeon: Milus Banister, MD;  Location: [facility] ENDOSCOPY;  Service: Endoscopy;  Laterality: N/A;   FINGER ARTHROPLASTY Left 05/14/2013   Procedure: LEFT THUMB CARPAL METACARPAL ARTHROPLASTY;  Surgeon: Tennis Must, MD;  Location: [place];  Service: Orthopedics;  Laterality: Left;   ICD IMPLANT N/A 04/03/2020   Procedure: ICD IMPLANT;  Surgeon: Constance Haw, MD;  Location: [place] CV LAB;  Service: Cardiovascular;  Laterality: N/A;   IR ANGIO INTRA EXTRACRAN SEL COM CAROTID INNOMINATE BILAT MOD SED  01/05/2017   IR ANGIO INTRA EXTRACRAN SEL COM CAROTID  INNOMINATE BILAT MOD SED  03/19/2019   IR ANGIO INTRA EXTRACRAN SEL COM CAROTID INNOMINATE BILAT MOD SED  06/04/2020   IR ANGIO INTRA EXTRACRAN SEL INTERNAL CAROTID UNI L MOD SED  04/15/2019   IR ANGIO VERTEBRAL SEL VERTEBRAL BILAT MOD SED  01/05/2017   IR ANGIO VERTEBRAL SEL VERTEBRAL BILAT MOD SED  03/19/2019   IR ANGIO VERTEBRAL SEL VERTEBRAL UNI L MOD SED  06/04/2020   IR ANGIOGRAM FOLLOW UP STUDY  04/15/2019   IR RADIOLOGIST EVAL & MGMT  12/30/2016   IR TRANSCATH/EMBOLIZ  04/15/2019   IR US GUIDE VASC ACCESS RIGHT  03/19/2019   IR US GUIDE VASC ACCESS RIGHT  06/04/2020   MALONEY DILATION N/A 09/20/2018   Procedure: Venia Minks DILATION;  Surgeon: Daneil Dolin, MD;  [facility];  Service: Endoscopy;  Laterality: N/A;   RADIOLOGY WITH ANESTHESIA N/A 04/15/2019   Procedure: [employer];  Surgeon: Luanne Bras, MD;  Location: [place];  Service: Radiology;  Laterality: N/A;   RIGHT/LEFT HEART CATH AND CORONARY ANGIOGRAPHY N/A 08/19/2019   Procedure: RIGHT/LEFT HEART CATH AND CORONARY ANGIOGRAPHY;  Surgeon: Larey Dresser, MD;  Location: [place] [facility] LAB;  Service: Cardiovascular;  Laterality: N/A;   TUBAL LIGATION  1987   VAGINAL HYSTERECTOMY  2009    Past Family History   Family History  Problem Relation Age of Onset   Stroke Mother    Hypertension Mother    Diabetes Mother    Heart attack Mother    Heart attack Father    Diabetes Father    Hypertension Father    CAD Father    Colon polyps Father 33       pre-cancerous    Stroke Father    Dementia Father    Hyperlipidemia Father    Breast cancer Maternal Grandmother    Cancer Maternal Grandfather        Tongue and esophageal   Heart failure Other    Colon cancer Neg Hx     Past Social History   Social History   Socioeconomic History   Marital status: Married    Spouse name: Not on file   Number of children: Not on file   Years of education: Not on file  Highest education level: Not on file   Occupational History   Occupation: CNA  Tobacco Use   Smoking status: Some Days    Packs/day: 0.10    Years: 15.00    Total pack years: 1.50    Types: Cigarettes   Smokeless tobacco: Never   Tobacco comments:    smokes a cigarette occasionally  Vaping Use   Vaping Use: Never used  Substance and Sexual Activity   Alcohol use: Yes    Comment: occasionally   Drug use: Not Currently    Types: Marijuana    Comment: former- 2017 last time   Sexual activity: Not Currently    Birth control/protection: Surgical    Comment: hyst  Other Topics Concern   Not on file  Social History Narrative   Lives with husband in [place] in a one story home with a basement.  Has 4 children.  Works as a Quarry manager.  Education: CNA school.    Social Determinants of Health   Financial Resource Strain: Low Risk  (06/11/2021)   Overall Financial Resource Strain (CARDIA)    Difficulty of Paying Living Expenses: Not hard at all  Food Insecurity: No Food Insecurity (06/11/2021)   Hunger Vital Sign    Worried About Running Out of Food in the Last Year: Never true    Ran Out of Food in the Last Year: Never true  Transportation Needs: No Transportation Needs (06/11/2021)   PRAPARE - Hydrologist (Medical): No    Lack of Transportation (Non-Medical): No  Physical Activity: Insufficiently Active (06/11/2021)   Exercise Vital Sign    Days of Exercise per Week: 4 days    Minutes of Exercise per Session: 30 min  Stress: Stress Concern Present (06/11/2021)   [place]    Feeling of Stress : Very much  Social Connections: Socially Isolated (06/11/2021)   Social Connection and Isolation Panel [NHANES]    Frequency of Communication with Friends and Family: Once a week    Frequency of Social Gatherings with Friends and Family: Never    Attends Religious Services: Never    Marine scientist or Organizations: No    Attends Theatre manager Meetings: Never    Marital Status: Married  Human resources officer Violence: Not At Risk (06/11/2021)   Humiliation, Afraid, Rape, and Kick questionnaire    Fear of Current or Ex-Partner: No    Emotionally Abused: No    Physically Abused: No    Sexually Abused: No    Review of Systems   General: Negative for anorexia, weight loss, fever, chills, fatigue, weakness. ENT: Negative for hoarseness, difficulty swallowing , nasal congestion. See hpi CV: Negative for chest pain, angina, palpitations, dyspnea on exertion, peripheral edema.  Respiratory: Negative for dyspnea at rest, dyspnea on exertion, cough, sputum, wheezing.  GI: See history of present illness. GU:  Negative for dysuria, hematuria, urinary incontinence, urinary frequency, nocturnal urination.  Endo: Negative for unusual weight change.     Physical Exam   BP 105/68 (BP Location: Right Arm, Patient Position: Sitting, Cuff Size: Normal)   Pulse 80   Temp 98.1 F (36.7 C) (Oral)   Ht 5' (1.524 m)   Wt 131 lb 12.8 oz (59.8 kg)   SpO2 97%   BMI 25.74 kg/m    General: Well-nourished, well-developed in no acute distress.  Eyes: No icterus. Mouth: Oropharyngeal mucosa moist and pink , no lesions erythema or exudate. Lungs:  Clear to auscultation bilaterally.  Heart: Regular rate and rhythm, no murmurs rubs or gallops.  Abdomen: Bowel sounds are normal, nontender, nondistended, no hepatosplenomegaly or masses,  no abdominal bruits or hernia , no rebound or guarding.  Rectal: not performed Extremities: No lower extremity edema. No clubbing or deformities. Neuro: Alert and oriented x 4   Skin: Warm and dry, no jaundice.   Psych: Alert and cooperative, normal mood and affect.  Labs    Lab Results  Component Value Date   CREATININE 1.22 (H) 04/13/2022   BUN 18 04/13/2022   NA 138 04/13/2022   K 4.0 04/13/2022   CL 103 04/13/2022   CO2 25 04/13/2022   Lab Results  Component Value Date   ALT 19 09/09/2021    AST 20 09/09/2021   ALKPHOS 94 09/09/2021   BILITOT 0.4 09/09/2021   Lab Results  Component Value Date   WBC 9.9 04/13/2022   HGB 13.6 04/13/2022   HCT 41.0 04/13/2022   MCV 87.8 04/13/2022   PLT 339 04/13/2022    Imaging Studies   DG Chest 2 View  Result Date: 04/13/2022 CLINICAL DATA:  Chest pain EXAM: CHEST - 2 VIEW COMPARISON:  CXR 02/28/22 FINDINGS: Left-sided single lead cardiac device in place with unchanged positioning. No pleural effusion. No pneumothorax. No focal airspace opacity. Unchanged cardiac and mediastinal contours. Surgical clips in the right upper quadrant. No displaced rib fractures. IMPRESSION: No active cardiopulmonary disease. Electronically Signed   By: Marin Roberts M.D.   On: 04/13/2022 13:21    Assessment   Odynophagia/epigastric pain: acute onset symptoms with postprandial pain in center of chest radiating into epigastric region. EGD 2022 with H.pylori gastritis s/p treatment and confirmed eradication. Acute onset odynophagia could be secondary to esophagitis, such as pill induce, viral, candida, less likely due to GERD with chronic PPI use.    PLAN   Start carafate 1 gram qac/qhs. Increase pantoprazole to '40mg'$  BID before meals.  Discussed with Dr. Gala Romney who advises to expedite EGD with first available spot/any provider. ASA 3/4.  I have discussed the risks, alternatives, benefits with regards to but not limited to the risk of reaction to medication, bleeding, infection, perforation and the patient is agreeable to proceed. Written consent to be obtained.    Laureen Ochs. Bobby Rumpf, [place], [facility]

## 2022-04-21 ENCOUNTER — Other Ambulatory Visit (HOSPITAL_COMMUNITY): Payer: Self-pay | Admitting: Interventional Radiology

## 2022-04-21 DIAGNOSIS — I6529 Occlusion and stenosis of unspecified carotid artery: Secondary | ICD-10-CM

## 2022-04-24 ENCOUNTER — Telehealth: Payer: Self-pay | Admitting: Gastroenterology

## 2022-04-24 NOTE — Telephone Encounter (Signed)
Please let pt know that Dr. Gala Romney advises urgent EGD to evaluate painful swallowing (odynophagia)/epigastric pain with any provider.  ASA 3/4.  She has defibrillator Hold Farxiga 72 hours. 24 hours of clear liquids before EGD.  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 Kandis Mannan. Jenetta Downer if patient agreeable.   Dr. Kandis Mannan. Jenetta Downer, would you be willing to performed EGD on Plavix.

## 2022-04-24 NOTE — Telephone Encounter (Signed)
No issues on my side, would only do biopsies if needed

## 2022-04-25 ENCOUNTER — Encounter (INDEPENDENT_AMBULATORY_CARE_PROVIDER_SITE_OTHER): Payer: Self-pay | Admitting: *Deleted

## 2022-04-25 ENCOUNTER — Ambulatory Visit (HOSPITAL_COMMUNITY)
Admission: RE | Admit: 2022-04-25 | Discharge: 2022-04-25 | Disposition: A | Discharge status: Home / Self Care | Payer: 59 | Source: Ambulatory Visit | Attending: Interventional Radiology | Admitting: Interventional Radiology

## 2022-04-25 DIAGNOSIS — I6529 Occlusion and stenosis of unspecified carotid artery: Secondary | ICD-10-CM

## 2022-04-25 NOTE — Telephone Encounter (Signed)
Called pt. Aware of pre-op appt.

## 2022-04-25 NOTE — Telephone Encounter (Signed)
Spoke with pt. She has been scheduled with Dr. Abbey Chatters 12/20 at 9:15am. Aware will send instructions via mychart. She has not taken any meds today.

## 2022-04-25 NOTE — Progress Notes (Signed)
VASCULAR LAB    Carotid duplex has been performed.  See CV proc for preliminary results.   Earlie Arciga, RVT 04/25/2022, 2:33 PM

## 2022-04-26 ENCOUNTER — Encounter (HOSPITAL_COMMUNITY): Payer: Self-pay

## 2022-04-26 ENCOUNTER — Ambulatory Visit (INDEPENDENT_AMBULATORY_CARE_PROVIDER_SITE_OTHER): Payer: 59 | Admitting: Internal Medicine

## 2022-04-26 ENCOUNTER — Encounter: Payer: Self-pay | Admitting: Internal Medicine

## 2022-04-26 ENCOUNTER — Encounter (HOSPITAL_COMMUNITY)
Admission: RE | Admit: 2022-04-26 | Discharge: 2022-04-26 | Disposition: A | Discharge status: Home / Self Care | Payer: 59 | Source: Ambulatory Visit | Attending: Internal Medicine | Admitting: Internal Medicine

## 2022-04-26 VITALS — BP 119/79 | HR 96 | Ht 60.0 in | Wt 128.8 lb

## 2022-04-26 VITALS — BP 134/77 | HR 90 | Temp 98.2°F | Resp 18 | Ht 60.0 in | Wt 131.8 lb

## 2022-04-26 DIAGNOSIS — R1310 Dysphagia, unspecified: Secondary | ICD-10-CM | POA: Diagnosis not present

## 2022-04-26 DIAGNOSIS — D3502 Benign neoplasm of left adrenal gland: Secondary | ICD-10-CM

## 2022-04-26 DIAGNOSIS — I671 Cerebral aneurysm, nonruptured: Secondary | ICD-10-CM | POA: Diagnosis not present

## 2022-04-26 DIAGNOSIS — M25512 Pain in left shoulder: Secondary | ICD-10-CM

## 2022-04-26 DIAGNOSIS — Z90710 Acquired absence of both cervix and uterus: Secondary | ICD-10-CM | POA: Diagnosis not present

## 2022-04-26 DIAGNOSIS — G2581 Restless legs syndrome: Secondary | ICD-10-CM

## 2022-04-26 DIAGNOSIS — G6289 Other specified polyneuropathies: Secondary | ICD-10-CM

## 2022-04-26 DIAGNOSIS — F411 Generalized anxiety disorder: Secondary | ICD-10-CM

## 2022-04-26 DIAGNOSIS — Z2821 Immunization not carried out because of patient refusal: Secondary | ICD-10-CM

## 2022-04-26 DIAGNOSIS — Z01812 Encounter for preprocedural laboratory examination: Secondary | ICD-10-CM | POA: Insufficient documentation

## 2022-04-26 DIAGNOSIS — E782 Mixed hyperlipidemia: Secondary | ICD-10-CM | POA: Diagnosis not present

## 2022-04-26 DIAGNOSIS — T502X5A Adverse effect of carbonic-anhydrase inhibitors, benzothiadiazides and other diuretics, initial encounter: Secondary | ICD-10-CM | POA: Insufficient documentation

## 2022-04-26 DIAGNOSIS — I5042 Chronic combined systolic (congestive) and diastolic (congestive) heart failure: Secondary | ICD-10-CM | POA: Diagnosis not present

## 2022-04-26 DIAGNOSIS — I10 Essential (primary) hypertension: Secondary | ICD-10-CM

## 2022-04-26 DIAGNOSIS — Z0001 Encounter for general adult medical examination with abnormal findings: Secondary | ICD-10-CM

## 2022-04-26 DIAGNOSIS — I255 Ischemic cardiomyopathy: Secondary | ICD-10-CM | POA: Diagnosis not present

## 2022-04-26 DIAGNOSIS — G8929 Other chronic pain: Secondary | ICD-10-CM

## 2022-04-26 DIAGNOSIS — I25119 Atherosclerotic heart disease of native coronary artery with unspecified angina pectoris: Secondary | ICD-10-CM | POA: Diagnosis not present

## 2022-04-26 DIAGNOSIS — M25511 Pain in right shoulder: Secondary | ICD-10-CM

## 2022-04-26 DIAGNOSIS — E781 Pure hyperglyceridemia: Secondary | ICD-10-CM

## 2022-04-26 HISTORY — DX: Presence of automatic (implantable) cardiac defibrillator: Z95.810

## 2022-04-26 LAB — BASIC METABOLIC PANEL
Anion gap: 8 (ref 5–15)
BUN: 13 mg/dL (ref 6–20)
CO2: 23 mmol/L (ref 22–32)
Calcium: 9.2 mg/dL (ref 8.9–10.3)
Chloride: 107 mmol/L (ref 98–111)
Creatinine, Ser: 0.97 mg/dL (ref 0.44–1.00)
GFR, Estimated: >60 mL/min (ref >60)
Glucose, Bld: 93 mg/dL (ref 70–99)
Potassium: 3.8 mmol/L (ref 3.5–5.1)
Sodium: 138 mmol/L (ref 135–145)

## 2022-04-26 MED ORDER — GABAPENTIN 300 MG PO CAPS: 300.0000 mg | ORAL_CAPSULE | Freq: Every day | ORAL | 2 refills | Status: DC

## 2022-04-26 NOTE — Progress Notes (Signed)
New Patient Office Visit  Subjective    Patient ID: Erin [name], female    DOB: 12-05-1960  Age: 61 y.o. MRN: [id number]  CC:  Chief Complaint  Patient presents with   Establish Care   HPI Erin presents to establish care.  She is a 61 year old woman with a past medical history significant for CAD s/p PCI to LCx and RCA in November 2017, ischemic cardiomyopathy, carotid stenosis, and PAD. She was previously followed by Dr. Maudie Mercury at the [facility].  Today Erin [name] states that she feels well.  Her acute concern is requesting a refill of gabapentin, which she takes nightly for treatment of neuropathy.  She states that she has been out of medication for the past month.  She is otherwise asymptomatic.  Acute concerns, chronic medical conditions, and outstanding preventative care items discussed today are individually addressed in A/P below.  Outpatient Encounter Medications as of 04/26/2022  Medication Sig   [EXPIRED] albuterol (VENTOLIN HFA) 108 (90 Base) MCG/ACT inhaler Inhale 2 puffs into the lungs every 4 (four) hours as needed for wheezing or shortness of breath.   aspirin EC 81 MG tablet Take 81 mg by mouth every evening.    atorvastatin (LIPITOR) 80 MG tablet TAKE 1 TABLET BY MOUTH ONCE DAILY AT  6PM   carvedilol (COREG) 6.25 MG tablet Take 1 tablet (6.25 mg total) by mouth 2 (two) times daily.   clopidogrel (PLAVIX) 75 MG tablet Take 1 tablet (75 mg total) by mouth daily.   dapagliflozin propanediol (FARXIGA) 10 MG TABS tablet Take 10 mg by mouth daily.   EPINEPHrine (EPIPEN 2-PAK) 0.3 mg/0.3 mL IJ SOAJ injection Inject 0.3 mg into the muscle as needed for anaphylaxis.   ezetimibe (ZETIA) 10 MG tablet Take 1 tablet by mouth once daily   furosemide (LASIX) 20 MG tablet Take 40 mg daily alternating with 20 mg daily   icosapent Ethyl ([name]) 1 g capsule Take 2 capsules (2 g total) by mouth 2 (two) times daily.   loperamide (IMODIUM) 2 MG capsule Take 2 mg by mouth 4  (four) times daily as needed for diarrhea or loose stools (ibs).   losartan (COZAAR) 25 MG tablet Take 1 tablet by mouth once daily   Multiple Vitamins-Minerals (MULTIVITAMIN WITH MINERALS) tablet Take 1 tablet by mouth daily.   nitroGLYCERIN (NITROSTAT) 0.4 MG SL tablet Place 1 tablet (0.4 mg total) under the tongue every 5 (five) minutes x 3 doses as needed for chest pain (if no relief after 2nd dose, proceed to the ED for an evaluation or call 911).   pantoprazole (PROTONIX) 40 MG tablet Take 1 tablet (40 mg total) by mouth 2 (two) times daily before a meal.   potassium chloride SA (KLOR-CON M) 20 MEQ tablet Take 2 tablets (40 mEq total) by mouth daily.   rOPINIRole (REQUIP) 0.5 MG tablet Take 0.5 mg by mouth at bedtime.    spironolactone (ALDACTONE) 25 MG tablet Take 1 tablet (25 mg total) by mouth at bedtime.   Vitamin D, Ergocalciferol, (DRISDOL) 1.25 MG (50000 UNIT) CAPS capsule Take 50,000 Units by mouth once a week. Takes on Mondays.   [DISCONTINUED] gabapentin (NEURONTIN) 300 MG capsule Take 300 mg by mouth at bedtime.   gabapentin (NEURONTIN) 300 MG capsule Take 1 capsule (300 mg total) by mouth at bedtime.   No facility-administered encounter medications on file as of 04/26/2022.   Past Medical History:  Diagnosis Date   AICD (automatic cardioverter/defibrillator) present  Allergic reaction to alpha-gal    Allergy 03/05/2022   Anemia    Anxiety state 03/25/2016   Arthritis    Basal cell carcinoma of forehead    Brain aneurysm    CHF (congestive heart failure) ([place])    Coronary artery disease    a. 03/11/16 PCI with DES-->Prox/Mid Cx;  b. 03/14/16 PCI with DES x2-->RCA, EF 30-35%.   Depression 06/09/2010   Encounter for general adult medical examination with abnormal findings 05/04/2022   Essential hypertension    GERD (gastroesophageal reflux disease)    HFrEF (heart failure with reduced ejection fraction) ([place])    a. 10/2016 Echo: EF 35-40%, Gr1 DD, mild focal basal septal  hypertrophy, basal inflat, mid inflat, basal antlat AK. Mid infept/inf/antlat, apical lateral sev HK. Mod MR. mildly reduced RV fxn. Mild TR.   History of pneumonia    Hyperlipidemia    IBS (irritable bowel syndrome)    Ischemic cardiomyopathy    a. 10/2016 Echo: EF 35-40%, Gr1 DD.   Mitral regurgitation    Neuromuscular disorder (HCC)    NSTEMI (non-ST elevated myocardial infarction) ([place]) 03/10/2016   Pneumonia 03/2016   Shingles    Squamous cell cancer of skin of nose    Thrombocytosis 03/26/2016   Tobacco abuse    Trichimoniasis    Wears dentures    Wears glasses     Past Surgical History:  Procedure Laterality Date   APPENDECTOMY     BIOPSY  09/20/2018   Procedure: BIOPSY;  Surgeon: Daneil Dolin, MD;  [facility];  Service: Endoscopy;;  colon   BIOPSY  01/05/2021   Procedure: BIOPSY;  Surgeon: Milus Banister, MD;  Location: [facility];  Service: Endoscopy;;   CARDIAC CATHETERIZATION N/A 03/11/2016   Procedure: Left Heart Cath and Coronary Angiography;  Surgeon: Leonie Man, MD;  Location: [place] [facility];  Service: Cardiovascular;  Laterality: N/A;   CARDIAC CATHETERIZATION N/A 03/11/2016   Procedure: Coronary Stent Intervention;  Surgeon: Leonie Man, MD;  Location: [place] [facility];  Service: Cardiovascular;  Laterality: N/A;   CARDIAC CATHETERIZATION N/A 03/14/2016   Procedure: Coronary Stent Intervention;  Surgeon: Peter M Martinique, MD;  Location: [place] [facility];  Service: Cardiovascular;  Laterality: N/A;   CEREBRAL ANEURYSM REPAIR  04/2019   stent placed   CHOLECYSTECTOMY OPEN  1984   COLONOSCOPY WITH PROPOFOL N/A 09/20/2018   Procedure: COLONOSCOPY WITH PROPOFOL;  Surgeon: Daneil Dolin, MD;  [facility];  Service: Endoscopy;  Laterality: N/A;  10:30am   CORONARY ANGIOPLASTY WITH STENT PLACEMENT  03/14/2016   ESOPHAGOGASTRODUODENOSCOPY (EGD) WITH PROPOFOL N/A 09/20/2018   Procedure: ESOPHAGOGASTRODUODENOSCOPY (EGD) WITH  PROPOFOL;  Surgeon: Daneil Dolin, MD;  [facility];  Service: Endoscopy;  Laterality: N/A;   ESOPHAGOGASTRODUODENOSCOPY (EGD) WITH PROPOFOL N/A 01/05/2021   Procedure: ESOPHAGOGASTRODUODENOSCOPY (EGD) WITH PROPOFOL;  Surgeon: Milus Banister, MD;  Location: [facility] ENDOSCOPY;  Service: Endoscopy;  Laterality: N/A;   FINGER ARTHROPLASTY Left 05/14/2013   Procedure: LEFT THUMB CARPAL METACARPAL ARTHROPLASTY;  Surgeon: Tennis Must, MD;  Location: [place];  Service: Orthopedics;  Laterality: Left;   ICD IMPLANT N/A 04/03/2020   Procedure: ICD IMPLANT;  Surgeon: Constance Haw, MD;  Location[facility];  Service: Cardiovascular;  Laterality: N/A;   IR ANGIO INTRA EXTRACRAN SEL COM CAROTID INNOMINATE BILAT MOD SED  01/05/2017   IR ANGIO INTRA EXTRACRAN SEL COM CAROTID INNOMINATE BILAT MOD SED  03/19/2019   IR ANGIO INTRA EXTRACRAN SEL COM CAROTID INNOMINATE BILAT MOD SED  06/04/2020   IR ANGIO INTRA EXTRACRAN SEL INTERNAL CAROTID UNI L MOD SED  04/15/2019   IR ANGIO VERTEBRAL SEL VERTEBRAL BILAT MOD SED  01/05/2017   IR ANGIO VERTEBRAL SEL VERTEBRAL BILAT MOD SED  03/19/2019   IR ANGIO VERTEBRAL SEL VERTEBRAL UNI L MOD SED  06/04/2020   IR ANGIOGRAM FOLLOW UP STUDY  04/15/2019   IR RADIOLOGIST EVAL & MGMT  12/30/2016   IR TRANSCATH/EMBOLIZ  04/15/2019   IR US GUIDE VASC ACCESS RIGHT  03/19/2019   IR US GUIDE VASC ACCESS RIGHT  06/04/2020   JOINT REPLACEMENT  05-15-2013   MALONEY DILATION N/A 09/20/2018   Procedure: Venia Minks DILATION;  Surgeon: Daneil Dolin, MD;  [facility];  Service: Endoscopy;  Laterality: N/A;   RADIOLOGY WITH ANESTHESIA N/A 04/15/2019   Procedure: [employer];  Surgeon: Luanne Bras, MD;  Location: [place];  Service: Radiology;  Laterality: N/A;   RIGHT/LEFT HEART CATH AND CORONARY ANGIOGRAPHY N/A 08/19/2019   Procedure: RIGHT/LEFT HEART CATH AND CORONARY ANGIOGRAPHY;  Surgeon: Larey Dresser, MD;  Location: [place] [facility];  Service: Cardiovascular;  Laterality: N/A;   TUBAL LIGATION  1987   VAGINAL HYSTERECTOMY  2009    Family History  Problem Relation Age of Onset   Stroke Mother    Hypertension Mother    Diabetes Mother    Heart attack Mother    Heart attack Father    Diabetes Father    Hypertension Father    CAD Father    Colon polyps Father 51       pre-cancerous    Stroke Father    Dementia Father    Hyperlipidemia Father    Arthritis Father    COPD Father    Heart disease Father    Breast cancer Maternal Grandmother    Diabetes Maternal Grandmother    Cancer Maternal Grandfather        Tongue and esophageal   Anxiety disorder Daughter    Depression Daughter    Anxiety disorder Daughter    Heart failure Other    Cancer Paternal Grandmother    Colon cancer Neg Hx     Social History   Socioeconomic History   Marital status: Married    Spouse name: Not on file   Number of children: Not on file   Years of education: Not on file   Highest education level: Not on file  Occupational History   Occupation: CNA  Tobacco Use   Smoking status: Light Smoker    Packs/day: 0.00    Years: 15.00    Total pack years: 0.00    Types: Cigarettes   Smokeless tobacco: Never   Tobacco comments:    smokes a cigarette occasionally  Vaping Use   Vaping Use: Never used  Substance and Sexual Activity   Alcohol use: Yes    Comment: occasionally   Drug use: Yes    Types: Marijuana    Comment: former- 2017 last time   Sexual activity: Not Currently    Birth control/protection: Surgical    Comment: hyst  Other Topics Concern   Not on file  Social History Narrative   Lives with husband in [place] in a one story home with a basement.  Has 4 children.  Works as a Quarry manager.  Education: CNA school.    Social Determinants of Health   Financial Resource Strain: Low Risk  (06/11/2021)  Overall Financial Resource Strain (CARDIA)    Difficulty of Paying Living Expenses: Not hard at all   Food Insecurity: No Food Insecurity (06/11/2021)   Hunger Vital Sign    Worried About Running Out of Food in the Last Year: Never true    Ran Out of Food in the Last Year: Never true  Transportation Needs: No Transportation Needs (06/11/2021)   PRAPARE - Hydrologist (Medical): No    Lack of Transportation (Non-Medical): No  Physical Activity: Insufficiently Active (06/11/2021)   Exercise Vital Sign    Days of Exercise per Week: 4 days    Minutes of Exercise per Session: 30 min  Stress: Stress Concern Present (06/11/2021)   [place]    Feeling of Stress : Very much  Social Connections: Socially Isolated (06/11/2021)   Social Connection and Isolation Panel [NHANES]    Frequency of Communication with Friends and Family: Once a week    Frequency of Social Gatherings with Friends and Family: Never    Attends Religious Services: Never    Marine scientist or Organizations: No    Attends Archivist Meetings: Never    Marital Status: Married  Human resources officer Violence: Not At Risk (06/11/2021)   Humiliation, Afraid, Rape, and Kick questionnaire    Fear of Current or Ex-Partner: No    Emotionally Abused: No    Physically Abused: No    Sexually Abused: No   Review of Systems  Constitutional:  Negative for chills and fever.  HENT:  Negative for sore throat.   Respiratory:  Negative for cough and shortness of breath.   Cardiovascular:  Negative for chest pain, palpitations and leg swelling.  Gastrointestinal:  Negative for abdominal pain, blood in stool, constipation, diarrhea, nausea and vomiting.  Genitourinary:  Negative for dysuria and hematuria.  Musculoskeletal:  Negative for myalgias.  Skin:  Negative for itching and rash.  Neurological:  Negative for dizziness and headaches.  Psychiatric/Behavioral:  Negative for depression and suicidal ideas. The patient is nervous/anxious and  has insomnia.    Objective    BP 119/79   Pulse 96   Ht 5' (1.524 m)   Wt 128 lb 12.8 oz (58.4 kg)   SpO2 95%   BMI 25.15 kg/m   Physical Exam Vitals reviewed.  Constitutional:      General: She is not in acute distress.    Appearance: Normal appearance. She is not ill-appearing or toxic-appearing.  HENT:     Head: Normocephalic and atraumatic.     Right Ear: External ear normal.     Left Ear: External ear normal.     Nose: Nose normal. No congestion or rhinorrhea.     Mouth/Throat:     Mouth: Mucous membranes are moist.     Pharynx: Oropharynx is clear. No oropharyngeal exudate or posterior oropharyngeal erythema.  Eyes:     General: No scleral icterus.    Extraocular Movements: Extraocular movements intact.     Conjunctiva/sclera: Conjunctivae normal.     Pupils: Pupils are equal, round, and reactive to light.  Cardiovascular:     Rate and Rhythm: Normal rate and regular rhythm.     Pulses: Normal pulses.     Heart sounds: Normal heart sounds. No murmur heard.    No friction rub. No gallop.  Pulmonary:     Effort: Pulmonary effort is normal.     Breath sounds: Normal breath sounds. No wheezing,  rhonchi or rales.  Abdominal:     General: Abdomen is flat. Bowel sounds are normal. There is no distension.     Palpations: Abdomen is soft.     Tenderness: There is no abdominal tenderness.  Musculoskeletal:        General: No swelling. Normal range of motion.     Cervical back: Normal range of motion.     Right lower leg: No edema.     Left lower leg: No edema.  Lymphadenopathy:     Cervical: No cervical adenopathy.  Skin:    General: Skin is warm and dry.     Capillary Refill: Capillary refill takes less than 2 seconds.     Coloration: Skin is not jaundiced.  Neurological:     General: No focal deficit present.     Mental Status: She is alert and oriented to person, place, and time.  Psychiatric:        Mood and Affect: Mood normal.        Behavior: Behavior  normal.    Last CBC Lab Results  Component Value Date   WBC 9.9 04/13/2022   HGB 13.6 04/13/2022   HCT 41.0 04/13/2022   MCV 87.8 04/13/2022   MCH 29.1 04/13/2022   RDW 15.7 (H) 04/13/2022   PLT 339 60/63/0160   Last metabolic panel Lab Results  Component Value Date   GLUCOSE 93 04/26/2022   NA 138 04/26/2022   K 3.8 04/26/2022   CL 107 04/26/2022   CO2 23 04/26/2022   BUN 13 04/26/2022   CREATININE 0.97 04/26/2022   GFRNONAA >60 04/26/2022   CALCIUM 9.2 04/26/2022   PROT 6.4 09/09/2021   ALBUMIN 4.0 09/09/2021   LABGLOB 2.4 09/09/2021   AGRATIO 1.7 09/09/2021   BILITOT 0.4 09/09/2021   ALKPHOS 94 09/09/2021   AST 20 09/09/2021   ALT 19 09/09/2021   ANIONGAP 8 04/26/2022   Last lipids Lab Results  Component Value Date   CHOL 153 03/07/2022   HDL 42 03/07/2022   LDLCALC 54 03/07/2022   TRIG 286 (H) 03/07/2022   CHOLHDL 3.6 03/07/2022   Last hemoglobin A1c Lab Results  Component Value Date   HGBA1C 6.2 (H) 04/26/2022   Last thyroid functions Lab Results  Component Value Date   TSH 0.848 04/26/2022   Last vitamin B12 and Folate Lab Results  Component Value Date   VITAMINB12 373 04/26/2022   FOLATE 13.4 04/26/2022      Assessment & Plan:   Problem List Items Addressed This Visit       Essential hypertension    Currently prescribed carvedilol, losartan, and spironolactone.  Her blood pressure today is 119/79.  No medication changes indicated.      Coronary artery disease involving native coronary artery of native heart    Denies recent chest pain.  History of NSTEMI in November 2017 s/p PCI to LCx and RCA.  She remains on Plavix and statin therapy.  No changes today.      Chronic combined systolic and diastolic congestive heart failure ([place])    Followed by cardiology ([name]) s/p ICD placement.  EF 30-35% on most recent echo from November 2022.  She is currently prescribed carvedilol, Farxiga, Lasix, losartan, and spironolactone.  She has  previously failed Entresto.  She is scheduled for repeat echocardiogram in February.  Asymptomatic and euvolemic on exam today. -No medication changes today      Brain aneurysm    History of PCom aneurysm s/p embolization by  IR in December 2020.  She is currently on Plavix.      Dysphagia    History of chronic dysphagia, worse with solid foods.  She is followed by GI and will undergo EGD tomorrow (12/20).      Adrenal adenoma, left    Benign adenoma.  Followed by endocrinology.  She has follow-up scheduled for May 2024.      Peripheral neuropathy    She endorses a history of peripheral neuropathy.  Symptoms previously well-controlled with gabapentin 300 mg nightly.  She requests a refill today. -Gabapentin refilled      Mixed hyperlipidemia    Currently prescribed atorvastatin 80 mg daily and Zetia 10 mg daily.  No medication changes today.  Lipid panel updated in October, currently at goal.      Anxiety state    She endorses a history of insomnia and anxiety attributed to previous traumatic experiences that have occurred at night.  Takes gabapentin 300 mg nightly and Requip but is otherwise not on any specific medication.  No medication changes today.      Restless leg syndrome    She was prescribed Requip nightly for treatment of RLS.  Iron studies have been ordered today.      Hypertriglyceridemia    TGs 286 on lipid panel from late October.  She is currently prescribed [name].  No changes today.      Bilateral shoulder pain    Chronic bilateral shoulder pain.  Followed by orthopedic surgery (Dr. Erlinda Hong).  Bilateral shoulder MRIs are pending.      Encounter for general adult medical examination with abnormal findings    Presenting today to establish care.  Previous records and labs been reviewed. -Indicated repeat labs have been ordered today -We will request records from her previous PCP and update any outstanding preventative care items at her next follow-up  appointment -Tentatively plan for follow-up in 6 months      Return in about 6 months (around 10/26/2022).   Johnette Abraham, MD

## 2022-04-26 NOTE — Patient Instructions (Signed)
It was a pleasure to see you today.  Thank you for giving [place] the opportunity to be involved in your care.  Below is a brief recap of your visit and next steps.  We will plan to see you again in 6 months.  Summary You have established care today We will update labs and plan for follow up in 6 months I have refilled gabapentin Please let us know if you need anything

## 2022-04-26 NOTE — Patient Instructions (Addendum)
[name]  04/26/2022     '@PREFPERIOPPHARMACY'$ @   Your procedure is scheduled on 04/27/2022.  Report to [facility] at 7:15 A.M.  Call this number if you have problems the morning of surgery:  [phone]  If you experience any cold or flu symptoms such as cough, fever, chills, shortness of breath, etc. between now and your scheduled surgery, please notify us at the above number.   Remember:    Please Follow the Diet and prep instructions given to you by Dr Ave Filter office.   Take these medicines the morning of surgery with A SIP OF WATER : Carvedilol, neurontin vascepa  pantaprazole  Last dose of Farxiga 04/25/2022      Do not wear jewelry, make-up or nail polish.  Do not wear lotions, powders, or perfumes, or deodorant.  Do not shave 48 hours prior to surgery.  Men may shave face and neck.  Do not bring valuables to the hospital.  [facility] is not responsible for any belongings or valuables.  Contacts, dentures or bridgework may not be worn into surgery.  Leave your suitcase in the car.  After surgery it may be brought to your room.  For patients admitted to the hospital, discharge time will be determined by your treatment team.  Patients discharged the day of surgery will not be allowed to drive home.   Name and phone number of your driver:   Family Special instructions:  N/A  Please read over the following fact sheets that you were given. Care and Recovery After Surgery    Upper Endoscopy, Adult Upper endoscopy is a procedure to look inside the upper GI (gastrointestinal) tract. The upper GI tract is made up of: The esophagus. This is the part of the body that moves food from your mouth to your stomach. The stomach. The duodenum. This is the first part of your small intestine. This procedure is also called esophagogastroduodenoscopy (EGD) or gastroscopy. In this procedure, your health care provider passes a thin, flexible tube (endoscope) through your  mouth and down your esophagus into your stomach and into your duodenum. A small camera is attached to the end of the tube. Images from the camera appear on a monitor in the exam room. During this procedure, your health care provider may also remove a small piece of tissue to be sent to a lab and examined under a microscope (biopsy). Your health care provider may do an upper endoscopy to diagnose cancers of the upper GI tract. You may also have this procedure to find the cause of other conditions, such as: Stomach pain. Heartburn. Pain or problems when swallowing. Nausea and vomiting. Stomach bleeding. Stomach ulcers. Tell a health care provider about: Any allergies you have. All medicines you are taking, including vitamins, herbs, eye drops, creams, and over-the-counter medicines. Any problems you or family members have had with anesthetic medicines. Any bleeding problems you have. Any surgeries you have had. Any medical conditions you have. Whether you are pregnant or may be pregnant. What are the risks? Your healthcare provider will talk with you about risks. These may include: Infection. Bleeding. Allergic reactions to medicines. A tear or hole (perforation) in the esophagus, stomach, or duodenum. What happens before the procedure? When to stop eating and drinking Follow instructions from your health care provider about what you may eat and drink. These may include: 8 hours before your procedure Stop eating most foods. Do not eat meat, fried foods, or fatty foods. Eat only light  foods, such as toast or crackers. All liquids are okay except energy drinks and alcohol. 6 hours before your procedure Stop eating. Drink only clear liquids, such as water, clear fruit juice, black coffee, plain tea, and sports drinks. Do not drink energy drinks or alcohol. 2 hours before your procedure Stop drinking all liquids. You may be allowed to take medicines with small sips of water. If you do  not follow your health care provider's instructions, your procedure may be delayed or canceled. Medicines Ask your health care provider about: Changing or stopping your regular medicines. This is especially important if you are taking diabetes medicines or blood thinners. Taking medicines such as aspirin and ibuprofen. These medicines can thin your blood. Do not take these medicines unless your health care provider tells you to take them. Taking over-the-counter medicines, vitamins, herbs, and supplements. General instructions If you will be going home right after the procedure, plan to have a responsible adult: Take you home from the hospital or clinic. You will not be allowed to drive. Care for you for the time you are told. What happens during the procedure?  An IV will be inserted into one of your veins. You may be given one or more of the following: A medicine to help you relax (sedative). A medicine to numb the throat (local anesthetic). You will lie on your left side on an exam table. Your health care provider will pass the endoscope through your mouth and down your esophagus. Your health care provider will use the scope to check the inside of your esophagus, stomach, and duodenum. Biopsies may be taken. The endoscope will be removed. The procedure may vary among health care providers and hospitals. What happens after the procedure? Your blood pressure, heart rate, breathing rate, and blood oxygen level will be monitored until you leave the hospital or clinic. When your throat is no longer numb, you may be given some fluids to drink. If you were given a sedative during the procedure, it can affect you for several hours. Do not drive or operate machinery until your health care provider says that it is safe. It is up to you to get the results of your procedure. Ask your health care provider, or the department that is doing the procedure, when your results will be ready. Contact a  health care provider if you: Have a sore throat that lasts longer than 1 day. Have a fever. Get help right away if you: Vomit blood or your vomit looks like coffee grounds. Have bloody, black, or tarry stools. Have a very bad sore throat or you cannot swallow. Have difficulty breathing or very bad pain in your chest or abdomen. These symptoms may be an emergency. Get help right away. Call 911. Do not wait to see if the symptoms will go away. Do not drive yourself to the hospital. Summary Upper endoscopy is a procedure to look inside the upper GI tract. During the procedure, an IV will be inserted into one of your veins. You may be given a medicine to help you relax. The endoscope will be passed through your mouth and down your esophagus. Follow instructions from your health care provider about what you can eat and drink. This information is not intended to replace advice given to you by your health care provider. Make sure you discuss any questions you have with your health care provider. Document Revised: 08/04/2021 Document Reviewed: 08/04/2021 Elsevier Patient Education  [place].  Monitored Anesthesia Care Anesthesia refers  to the techniques, procedures, and medicines that help a person stay safe and comfortable during surgery. Monitored anesthesia care, or sedation, is one type of anesthesia. You may have sedation if you do not need to be asleep for your procedure. Procedures that use sedation may include: Surgery to remove cataracts from your eyes. A dental procedure. A biopsy. This is when a tissue sample is removed and looked at under a microscope. You will be watched closely during your procedure. Your level of sedation or type of anesthesia may be changed to fit your needs. Tell a health care provider about: Any allergies you have. All medicines you are taking, including vitamins, herbs, eye drops, creams, and over-the-counter medicines. Any problems you or family  members have had with anesthesia. Any bleeding problems you have. Any surgeries you have had. Any medical conditions or illnesses you have. This includes sleep apnea, cough, fever, or the flu. Whether you are pregnant or may be pregnant. Whether you use cigarettes, alcohol, or drugs. Any use of steroids, whether by mouth or as a cream. What are the risks? Your health care provider will talk with you about risks. These may include: Getting too much medicine (oversedation). Nausea. Allergic reactions to medicines. Trouble breathing. If this happens, a breathing tube may be used to help you breathe. It will be removed when you are awake and breathing on your own. Heart trouble. Lung trouble. Confusion that gets better with time (emergence delirium). What happens before the procedure? When to stop eating and drinking Follow instructions from your health care provider about what you may eat and drink. These may include: 8 hours before your procedure Stop eating most foods. Do not eat meat, fried foods, or fatty foods. Eat only light foods, such as toast or crackers. All liquids are okay except energy drinks and alcohol. 6 hours before your procedure Stop eating. Drink only clear liquids, such as water, clear fruit juice, black coffee, plain tea, and sports drinks. Do not drink energy drinks or alcohol. 2 hours before your procedure Stop drinking all liquids. You may be allowed to take medicines with small sips of water. If you do not follow your health care provider's instructions, your procedure may be delayed or canceled. Medicines Ask your health care provider about: Changing or stopping your regular medicines. These include any diabetes medicines or blood thinners you take. Taking medicines such as aspirin and ibuprofen. These medicines can thin your blood. Do not take them unless your health care provider tells you to. Taking over-the-counter medicines, vitamins, herbs, and  supplements. Testing You may have an exam or testing. You may have a blood or urine sample taken. General instructions Do not use any products that contain nicotine or tobacco for at least 4 weeks before the procedure. These products include cigarettes, chewing tobacco, and vaping devices, such as e-cigarettes. If you need help quitting, ask your health care provider. If you will be going home right after the procedure, plan to have a responsible adult: Take you home from the hospital or clinic. You will not be allowed to drive. Care for you for the time you are told. What happens during the procedure?  Your blood pressure, heart rate, breathing, level of pain, and blood oxygen level will be monitored. An IV will be inserted into one of your veins. You may be given: A sedative. This helps you relax. Anesthesia. This will: Numb certain areas of your body. Make you fall asleep for surgery. You will be given medicines  as needed to keep you comfortable. The more medicine you are given, the deeper your level of sedation will be. Your level of sedation may be changed to fit your needs. There are three levels of sedation: Mild sedation. At this level, you may feel awake and relaxed. You will be able to follow directions. Moderate sedation. At this level, you will be sleepy. You may not remember the procedure. Deep sedation. At this level, you will be asleep. You will not remember the procedure. How you get the medicines will depend on your age and the procedure. They may be given as: A pill. This may be taken by mouth (orally) or inserted into the rectum. An injection. This may be into a vein or muscle. A spray through the nose. After your procedure is over, the medicine will be stopped. The procedure may vary among health care providers and hospitals. What happens after the procedure? Your blood pressure, heart rate, breathing rate, and blood oxygen level will be monitored until you leave the  hospital or clinic. You may feel sleepy, clumsy, or nauseous. You may not remember what happened during or after the procedure. Sedation can affect you for several hours. Do not drive or use machinery until your health care provider says that it is safe. This information is not intended to replace advice given to you by your health care provider. Make sure you discuss any questions you have with your health care provider. Document Revised: 09/20/2021 Document Reviewed: 09/20/2021 Elsevier Patient Education  [place].

## 2022-04-27 ENCOUNTER — Ambulatory Visit (HOSPITAL_BASED_OUTPATIENT_CLINIC_OR_DEPARTMENT_OTHER): Payer: 59 | Admitting: Anesthesiology

## 2022-04-27 ENCOUNTER — Encounter (HOSPITAL_COMMUNITY)
Admission: RE | Disposition: A | Discharge status: Home / Self Care | Payer: Self-pay | Source: Home / Self Care | Attending: Internal Medicine

## 2022-04-27 ENCOUNTER — Ambulatory Visit (HOSPITAL_COMMUNITY): Payer: 59 | Admitting: Anesthesiology

## 2022-04-27 ENCOUNTER — Encounter (HOSPITAL_COMMUNITY): Payer: Self-pay

## 2022-04-27 ENCOUNTER — Ambulatory Visit (HOSPITAL_COMMUNITY)
Admission: RE | Admit: 2022-04-27 | Discharge: 2022-04-27 | Disposition: A | Discharge status: Home / Self Care | Payer: 59 | Attending: Internal Medicine | Admitting: Internal Medicine

## 2022-04-27 DIAGNOSIS — K449 Diaphragmatic hernia without obstruction or gangrene: Secondary | ICD-10-CM

## 2022-04-27 DIAGNOSIS — K219 Gastro-esophageal reflux disease without esophagitis: Secondary | ICD-10-CM | POA: Insufficient documentation

## 2022-04-27 DIAGNOSIS — I5022 Chronic systolic (congestive) heart failure: Secondary | ICD-10-CM | POA: Diagnosis not present

## 2022-04-27 DIAGNOSIS — K2970 Gastritis, unspecified, without bleeding: Secondary | ICD-10-CM | POA: Diagnosis not present

## 2022-04-27 DIAGNOSIS — F17210 Nicotine dependence, cigarettes, uncomplicated: Secondary | ICD-10-CM | POA: Insufficient documentation

## 2022-04-27 DIAGNOSIS — R69 Illness, unspecified: Secondary | ICD-10-CM | POA: Diagnosis not present

## 2022-04-27 DIAGNOSIS — I2510 Atherosclerotic heart disease of native coronary artery without angina pectoris: Secondary | ICD-10-CM | POA: Insufficient documentation

## 2022-04-27 DIAGNOSIS — K2950 Unspecified chronic gastritis without bleeding: Secondary | ICD-10-CM | POA: Diagnosis not present

## 2022-04-27 DIAGNOSIS — K2210 Ulcer of esophagus without bleeding: Secondary | ICD-10-CM | POA: Insufficient documentation

## 2022-04-27 DIAGNOSIS — K580 Irritable bowel syndrome with diarrhea: Secondary | ICD-10-CM | POA: Insufficient documentation

## 2022-04-27 DIAGNOSIS — I252 Old myocardial infarction: Secondary | ICD-10-CM | POA: Diagnosis not present

## 2022-04-27 DIAGNOSIS — I110 Hypertensive heart disease with heart failure: Secondary | ICD-10-CM | POA: Insufficient documentation

## 2022-04-27 DIAGNOSIS — I10 Essential (primary) hypertension: Secondary | ICD-10-CM | POA: Diagnosis not present

## 2022-04-27 HISTORY — PX: ESOPHAGOGASTRODUODENOSCOPY (EGD) WITH PROPOFOL: SHX5813

## 2022-04-27 HISTORY — PX: BIOPSY: SHX5522

## 2022-04-27 LAB — B12 AND FOLATE PANEL
Folate: 13.4 ng/mL (ref >3.0)
Vitamin B-12: 373 pg/mL (ref 232–1245)

## 2022-04-27 LAB — IRON,TIBC AND FERRITIN PANEL
Ferritin: 97 ng/mL (ref 15–150)
Iron Saturation: 17 % (ref 15–55)
Iron: 54 ug/dL (ref 27–159)
Total Iron Binding Capacity: 322 ug/dL (ref 250–450)
UIBC: 268 ug/dL (ref 131–425)

## 2022-04-27 LAB — HEMOGLOBIN A1C
Est. average glucose Bld gHb Est-mCnc: 131 mg/dL
Hgb A1c MFr Bld: 6.2 % — ABNORMAL HIGH (ref 4.8–5.6)

## 2022-04-27 LAB — TSH+FREE T4
Free T4: 1.64 ng/dL (ref 0.82–1.77)
TSH: 0.848 u[IU]/mL (ref 0.450–4.500)

## 2022-04-27 LAB — VITAMIN D 25 HYDROXY (VIT D DEFICIENCY, FRACTURES): Vit D, 25-Hydroxy: 20.2 ng/mL — ABNORMAL LOW (ref 30.0–100.0)

## 2022-04-27 SURGERY — ESOPHAGOGASTRODUODENOSCOPY (EGD) WITH PROPOFOL
Anesthesia: General

## 2022-04-27 MED ORDER — PROPOFOL 10 MG/ML IV BOLUS
INTRAVENOUS | Status: DC | PRN
  Administered 2022-04-27: 20 mg via INTRAVENOUS
  Administered 2022-04-27: 30 mg via INTRAVENOUS
  Administered 2022-04-27: 70 mg via INTRAVENOUS

## 2022-04-27 MED ORDER — SUCRALFATE 1 GM/10ML PO SUSP: 1.0000 g | Freq: Four times a day (QID) | ORAL | 1 refills | Status: DC

## 2022-04-27 MED ORDER — LIDOCAINE HCL (CARDIAC) PF 100 MG/5ML IV SOSY
PREFILLED_SYRINGE | INTRAVENOUS | Status: DC | PRN
  Administered 2022-04-27: 50 mg via INTRATRACHEAL

## 2022-04-27 MED ORDER — LACTATED RINGERS IV SOLN
INTRAVENOUS | Status: DC
  Administered 2022-04-27: 1000 mL via INTRAVENOUS

## 2022-04-27 MED ORDER — PHENYLEPHRINE 80 MCG/ML (10ML) SYRINGE FOR IV PUSH (FOR BLOOD PRESSURE SUPPORT)
PREFILLED_SYRINGE | INTRAVENOUS | Status: DC | PRN
  Administered 2022-04-27: 160 ug via INTRAVENOUS

## 2022-04-27 NOTE — Transfer of Care (Signed)
Immediate Anesthesia Transfer of Care Note  Patient: Erin [name]  Procedure(s) Performed: ESOPHAGOGASTRODUODENOSCOPY (EGD) WITH PROPOFOL BIOPSY  Patient Location: Short Stay  Anesthesia Type:General  Level of Consciousness: awake, alert , oriented, and patient cooperative  Airway & Oxygen Therapy: Patient Spontanous Breathing and Patient connected to nasal cannula oxygen  Post-op Assessment: Report given to RN, Post -op Vital signs reviewed and stable, and Patient moving all extremities  Post vital signs: Reviewed and stable  Last Vitals:  Vitals Value Taken Time  BP 76/50 04/27/22 0902  Temp 36.6 C 04/27/22 0902  Pulse 72 04/27/22 0902  Resp 20 04/27/22 0902  SpO2 100 % 04/27/22 0902    Last Pain:  Vitals:   04/27/22 0902  TempSrc: Oral  PainSc: 0-No pain      Patients Stated Pain Goal: 8 (32/35/57 [id number])  Complications: No notable events documented.

## 2022-04-27 NOTE — Anesthesia Preprocedure Evaluation (Signed)
Anesthesia Evaluation  Patient identified by MRN, date of birth, ID band Patient awake    Reviewed: Allergy & Precautions, H&P , NPO status , Patient's Chart, lab work & pertinent test results, reviewed documented beta blocker date and time   Airway Mallampati: II  TM Distance: >3 FB Neck ROM: full    Dental no notable dental hx. (+) Upper Dentures, Lower Dentures   Pulmonary pneumonia, Current Smoker   Pulmonary exam normal breath sounds clear to auscultation       Cardiovascular Exercise Tolerance: Good hypertension, + CAD, + Past MI and +CHF  + Cardiac Defibrillator  Rhythm:regular Rate:Normal     Neuro/Psych  PSYCHIATRIC DISORDERS Anxiety Depression     Neuromuscular disease    GI/Hepatic Neg liver ROS,GERD  Medicated,,  Endo/Other  negative endocrine ROS    Renal/GU Renal disease  negative genitourinary   Musculoskeletal   Abdominal   Peds  Hematology  (+) Blood dyscrasia, anemia   Anesthesia Other Findings   Reproductive/Obstetrics negative OB ROS                             Anesthesia Physical Anesthesia Plan  ASA: 3  Anesthesia Plan: General   Post-op Pain Management:    Induction:   PONV Risk Score and Plan: Propofol infusion  Airway Management Planned:   Additional Equipment:   Intra-op Plan:   Post-operative Plan:   Informed Consent: I have reviewed the patients History and Physical, chart, labs and discussed the procedure including the risks, benefits and alternatives for the proposed anesthesia with the patient or authorized representative who has indicated his/her understanding and acceptance.     Dental Advisory Given  Plan Discussed with: CRNA  Anesthesia Plan Comments:        Anesthesia Quick Evaluation

## 2022-04-27 NOTE — Interval H&P Note (Signed)
History and Physical Interval Note:  04/27/2022 8:44 AM  Erin [name]  has presented today for surgery, with the diagnosis of painful swallowing, epigastric pain.  The various methods of treatment have been discussed with the patient and family. After consideration of risks, benefits and other options for treatment, the patient has consented to  Procedure(s) with comments: ESOPHAGOGASTRODUODENOSCOPY (EGD) WITH PROPOFOL (N/A) - 9:15am, asa 3/4, ASAP as a surgical intervention.  The patient's history has been reviewed, patient examined, no change in status, stable for surgery.  I have reviewed the patient's chart and labs.  Questions were answered to the patient's satisfaction.     Eloise Harman

## 2022-04-27 NOTE — Discharge Instructions (Addendum)
EGD Discharge instructions Please read the instructions outlined below and refer to this sheet in the next few weeks. These discharge instructions provide you with general information on caring for yourself after you leave the hospital. Your doctor may also give you specific instructions. While your treatment has been planned according to the most current medical practices available, unavoidable complications occasionally occur. If you have any problems or questions after discharge, please call your doctor. ACTIVITY You may resume your regular activity but move at a slower pace for the next 24 hours.  Take frequent rest periods for the next 24 hours.  Walking will help expel (get rid of) the air and reduce the bloated feeling in your abdomen.  No driving for 24 hours (because of the anesthesia (medicine) used during the test).  You may shower.  Do not sign any important legal documents or operate any machinery for 24 hours (because of the anesthesia used during the test).  NUTRITION Drink plenty of fluids.  You may resume your normal diet.  Begin with a light meal and progress to your normal diet.  Avoid alcoholic beverages for 24 hours or as instructed by your caregiver.  MEDICATIONS You may resume your normal medications unless your caregiver tells you otherwise.  WHAT YOU CAN EXPECT TODAY You may experience abdominal discomfort such as a feeling of fullness or "gas" pains.  FOLLOW-UP Your doctor will discuss the results of your test with you.  SEEK IMMEDIATE MEDICAL ATTENTION IF ANY OF THE FOLLOWING OCCUR: Excessive nausea (feeling sick to your stomach) and/or vomiting.  Severe abdominal pain and distention (swelling).  Trouble swallowing.  Temperature over 101 F (37.8 C).  Rectal bleeding or vomiting of blood.    Your EGD revealed mild amount inflammation in your stomach.  I took biopsies of this to rule out infection with a bacteria called H. pylori.  Await pathology results, my  office will contact you.  You have an ulcer in your esophagus which is likely causing your symptoms. I took biopsies of this as well. Limit NSAID use. Continue on Pantoprazole twice daily. I will send in liquid Carafate to take up to 4 times a day as well to help. Follow up with GI in 2-3 months.   I hope you have a great rest of your week!  Elon Alas. Abbey Chatters, D.O. Gastroenterology and Hepatology [facility]

## 2022-04-27 NOTE — Op Note (Signed)
[facility] Patient Name: Erin [name] Procedure Date: 04/27/2022 8:48 AM MRN: [id number] Date of Birth: 1960-10-25 Attending MD: Elon Alas. Abbey Chatters , [place], [id number] CSN: [id number] Age: 61 Admit Type: Outpatient Procedure:                Upper GI endoscopy Indications:              Epigastric abdominal pain, Odynophagia Providers:                Elon Alas. Abbey Chatters, DO, Tammy Vaught, RN, Everardo [place] Referring MD:              Medicines:                See the Anesthesia note for documentation of the                            administered medications Complications:            No immediate complications. Estimated Blood Loss:     Estimated blood loss was minimal. Procedure:                Pre-Anesthesia Assessment:                           - The anesthesia plan was to use monitored                            anesthesia care (MAC).                           After obtaining informed consent, the endoscope was                            passed under direct vision. Throughout the                            procedure, the patient's blood pressure, pulse, and                            oxygen saturations were monitored continuously. The                            GIF-H190 ([id number]) scope was introduced through the                            mouth, and advanced to the second part of duodenum.                            The upper GI endoscopy was accomplished without                            difficulty. The patient tolerated the procedure                            well. Scope In: 8:53:52  AM Scope Out: 8:58:04 AM Total Procedure Duration: 0 hours 4 minutes 12 seconds  Findings:      A small hiatal hernia was present.      One superficial esophageal ulcer with no bleeding and no stigmata of       recent bleeding was found 34 cm from the incisors. The lesion was 6 mm       in largest dimension. Biopsies were taken with a cold forceps for        histology.      Patchy mild inflammation characterized by erythema was found in the       gastric body and in the gastric antrum. Biopsies were taken with a cold       forceps for Helicobacter pylori testing.      The duodenal bulb, first portion of the duodenum and second portion of       the duodenum were normal. Impression:               - Small hiatal hernia.                           - Esophageal ulcer with no bleeding and no stigmata                            of recent bleeding. Biopsied.                           - Gastritis. Biopsied.                           - Normal duodenal bulb, first portion of the                            duodenum and second portion of the duodenum. Moderate Sedation:      Per Anesthesia Care Recommendation:           - Patient has a contact number available for                            emergencies. The signs and symptoms of potential                            delayed complications were discussed with the                            patient. Return to normal activities tomorrow.                            Written discharge instructions were provided to the                            patient.                           - Resume previous diet.                           - Continue present medications.                           -  Await pathology results.                           - Use a proton pump inhibitor PO BID.                           - Use sucralfate suspension 1 gram PO QID.                           - Return to GI clinic in 3 months. Procedure Code(s):        --- Professional ---                           [phone], Esophagogastroduodenoscopy, flexible,                            transoral; with biopsy, single or multiple Diagnosis Code(s):        --- Professional ---                           K44.9, Diaphragmatic hernia without obstruction or                            gangrene                           K22.10, Ulcer of esophagus without  bleeding                           K29.70, Gastritis, unspecified, without bleeding                           R10.13, Epigastric pain                           R13.10, Dysphagia, unspecified CPT copyright 2022 American Medical Association. All rights reserved. The codes documented in this report are preliminary and upon coder review may  be revised to meet current compliance requirements. Elon Alas. Abbey Chatters, DO [place] Abbey Chatters, DO 04/27/2022 9:06:22 AM This report has been signed electronically. Number of Addenda: 0

## 2022-04-28 ENCOUNTER — Telehealth (HOSPITAL_COMMUNITY): Payer: Self-pay

## 2022-04-28 NOTE — Progress Notes (Signed)
No ICM remote transmission received for 04/25/2022 and next ICM transmission scheduled for 05/30/2022.   

## 2022-04-28 NOTE — Telephone Encounter (Signed)
Pt agreed to f/u in 6 months with us carotid. AW 

## 2022-04-29 LAB — SURGICAL PATHOLOGY

## 2022-04-29 NOTE — Anesthesia Postprocedure Evaluation (Signed)
Anesthesia Post Note  Patient: Erin [name]  Procedure(s) Performed: ESOPHAGOGASTRODUODENOSCOPY (EGD) WITH PROPOFOL BIOPSY  Patient location during evaluation: Phase II Anesthesia Type: General Level of consciousness: awake Pain management: pain level controlled Vital Signs Assessment: post-procedure vital signs reviewed and stable Respiratory status: spontaneous breathing and respiratory function stable Cardiovascular status: blood pressure returned to baseline and stable Postop Assessment: no headache and no apparent nausea or vomiting Anesthetic complications: no Comments: Late entry   No notable events documented.   Last Vitals:  Vitals:   04/27/22 0902 04/27/22 0906  BP: (!) 76/50 (!) 88/64  Pulse: 72   Resp: 20   Temp: 36.6 C   SpO2: 100%     Last Pain:  Vitals:   04/27/22 0902  TempSrc: Oral  PainSc: 0-No pain                 Louann Sjogren

## 2022-05-04 ENCOUNTER — Encounter: Payer: Self-pay | Admitting: Internal Medicine

## 2022-05-04 DIAGNOSIS — G629 Polyneuropathy, unspecified: Secondary | ICD-10-CM | POA: Insufficient documentation

## 2022-05-04 DIAGNOSIS — Z0001 Encounter for general adult medical examination with abnormal findings: Secondary | ICD-10-CM

## 2022-05-04 DIAGNOSIS — E781 Pure hyperglyceridemia: Secondary | ICD-10-CM | POA: Insufficient documentation

## 2022-05-04 DIAGNOSIS — M25511 Pain in right shoulder: Secondary | ICD-10-CM | POA: Insufficient documentation

## 2022-05-04 DIAGNOSIS — G2581 Restless legs syndrome: Secondary | ICD-10-CM | POA: Insufficient documentation

## 2022-05-04 HISTORY — DX: Encounter for general adult medical examination with abnormal findings: Z00.01

## 2022-05-04 NOTE — Assessment & Plan Note (Signed)
She endorses a history of insomnia and anxiety attributed to previous traumatic experiences that have occurred at night.  Takes gabapentin 300 mg nightly and Requip but is otherwise not on any specific medication.  No medication changes today.

## 2022-05-04 NOTE — Assessment & Plan Note (Signed)
She endorses a history of peripheral neuropathy.  Symptoms previously well-controlled with gabapentin 300 mg nightly.  She requests a refill today. -Gabapentin refilled

## 2022-05-04 NOTE — Assessment & Plan Note (Signed)
Currently prescribed atorvastatin 80 mg daily and Zetia 10 mg daily.  No medication changes today.  Lipid panel updated in October, currently at goal.

## 2022-05-04 NOTE — Assessment & Plan Note (Signed)
History of chronic dysphagia, worse with solid foods.  She is followed by GI and will undergo EGD tomorrow (12/20).

## 2022-05-04 NOTE — Assessment & Plan Note (Signed)
History of PCom aneurysm s/p embolization by IR in December 2020.  She is currently on Plavix.

## 2022-05-04 NOTE — Assessment & Plan Note (Signed)
She was prescribed Requip nightly for treatment of RLS.  Iron studies have been ordered today.

## 2022-05-04 NOTE — Assessment & Plan Note (Signed)
Followed by cardiology ([name]) s/p ICD placement.  EF 30-35% on most recent echo from November 2022.  She is currently prescribed carvedilol, Farxiga, Lasix, losartan, and spironolactone.  She has previously failed Entresto.  She is scheduled for repeat echocardiogram in February.  Asymptomatic and euvolemic on exam today. -No medication changes today

## 2022-05-04 NOTE — Assessment & Plan Note (Signed)
Chronic bilateral shoulder pain.  Followed by orthopedic surgery (Dr. Erlinda Hong).  Bilateral shoulder MRIs are pending.

## 2022-05-04 NOTE — Assessment & Plan Note (Addendum)
Presenting today to establish care.  Previous records and labs been reviewed. -Indicated repeat labs have been ordered today -We will request records from her previous PCP and update any outstanding preventative care items at her next follow-up appointment -Tentatively plan for follow-up in 6 months

## 2022-05-04 NOTE — Assessment & Plan Note (Signed)
Benign adenoma.  Followed by endocrinology.  She has follow-up scheduled for May 2024.

## 2022-05-04 NOTE — Assessment & Plan Note (Signed)
Currently prescribed carvedilol, losartan, and spironolactone.  Her blood pressure today is 119/79.  No medication changes indicated.

## 2022-05-04 NOTE — Assessment & Plan Note (Signed)
Denies recent chest pain.  History of NSTEMI in November 2017 s/p PCI to LCx and RCA.  She remains on Plavix and statin therapy.  No changes today.

## 2022-05-04 NOTE — Assessment & Plan Note (Signed)
TGs 286 on lipid panel from late October.  She is currently prescribed [name].  No changes today.

## 2022-05-11 ENCOUNTER — Encounter (HOSPITAL_COMMUNITY): Payer: Self-pay | Admitting: Internal Medicine

## 2022-05-23 ENCOUNTER — Other Ambulatory Visit (HOSPITAL_COMMUNITY): Payer: Self-pay | Admitting: Cardiology

## 2022-05-27 ENCOUNTER — Ambulatory Visit: Payer: 59 | Admitting: Internal Medicine

## 2022-05-30 ENCOUNTER — Ambulatory Visit: Payer: 59 | Attending: Cardiology

## 2022-06-06 ENCOUNTER — Ambulatory Visit: Payer: 59 | Attending: Cardiology

## 2022-06-06 DIAGNOSIS — I5022 Chronic systolic (congestive) heart failure: Secondary | ICD-10-CM

## 2022-06-06 DIAGNOSIS — Z95810 Presence of automatic (implantable) cardiac defibrillator: Secondary | ICD-10-CM | POA: Diagnosis not present

## 2022-06-06 NOTE — Progress Notes (Signed)
No ICM remote transmission received for 05/30/2022 and next ICM transmission scheduled for 06/13/2022.  My chart message sent to patient requesting to send manual remote transmission on 2/5.

## 2022-06-08 NOTE — Progress Notes (Signed)
EPIC Encounter for ICM Monitoring  Patient Name: Erin [name] is a 62 y.o. female Date: 06/08/2022 Primary Care Physican: Johnette Abraham, MD Primary Cardiologist: Aundra Dubin Electrophysiologist: Curt Bears 08/11/2021 Office Weight: 121 lbs. 03/25/2022 Office Weight 125-127 lbs                                                          Spoke with patient and heart failure questions reviewed.  Transmission results reviewed.  Pt asymptomatic for fluid accumulation.  Reports feeling well at this time and voices no complaints.   Pt's granddaughter is back in the hospital and MG exacerbation.    Optivol thoracic impedance normal but was suggesting possible fluid accumulation 1/10-1/22.   Prescribed: Furosemide 20 mg Take 2 tablets (40 mg total) by mouth every other day alternating with 20 mg every other day. Potassium 20 mEq take 2 tablets(40 mEq total) by mouth daily Spironolactone 25 mg take 1 tablet daily   Labs: 04/26/2022 Creatinine 0.97, BUN 13, Potassium 3.8, Sodium 138, GFR >60 04/13/2022 Creatinine 1.22, BUN 18, Potassium 4.0, Sodium 138, GFR 51  03/25/2022 Creatinine 1.08, BUN 20, Potassium 4.7, Sodium 137, GFR 59 03/17/2022 Creatinine 1.11, BUN 11, Potassium 2.8, Sodium 140, GFR 57  03/07/2022 Creatinine 0.93, BUN 9,   Potassium 3.1, Sodium 17, GFR >60  A complete set of results can be found in Results Review.   Recommendations:  No changes and encouraged to call if experiencing any fluid symptoms.   Follow-up plan: ICM clinic phone appointment on 07/11/2022.   91 day device clinic remote transmission 07/29/2022.     EP/Cardiology Office Visits:  06/13/2022 with Dr Aundra Dubin.  06/09/2022 with Dr Curt Bears.     Copy of ICM check sent to Dr. Curt Bears.     3 month ICM trend: 06/06/2022.    12-14 Month ICM trend:     Rosalene Billings, RN 06/08/2022 3:01 PM

## 2022-06-09 ENCOUNTER — Encounter: Payer: Self-pay | Admitting: Cardiology

## 2022-06-09 ENCOUNTER — Ambulatory Visit: Payer: 59 | Attending: Cardiology | Admitting: Cardiology

## 2022-06-09 VITALS — BP 98/60 | HR 97 | Ht 60.0 in | Wt 129.2 lb

## 2022-06-09 DIAGNOSIS — I5022 Chronic systolic (congestive) heart failure: Secondary | ICD-10-CM

## 2022-06-09 DIAGNOSIS — I2510 Atherosclerotic heart disease of native coronary artery without angina pectoris: Secondary | ICD-10-CM

## 2022-06-09 NOTE — Progress Notes (Signed)
Electrophysiology Office Note   Date:  06/09/2022   ID:  Erin [name], Osoria 1960-12-20, MRN [id number]  PCP:  Johnette Abraham, MD  Cardiologist:  Aundra Dubin Primary Electrophysiologist:  Analysse Quinonez Meredith Leeds, MD    Chief Complaint: CHF   History of Present Illness: Erin [name] is a 62 y.o. female who is being seen today for the evaluation of CHF at the request of [name], MD. Presenting today for electrophysiology evaluation.  We have again for coronary artery disease, chronic systolic heart failure due to ischemic cardiomyopathy, peripheral arterial disease.  She had a non-STEMI in 2017 with circumflex and RCA stents.  She subsequently developed an ischemic cardiomyopathy.  She is status post Medtronic dual-chamber pacemaker ICD 04/03/2020.  Today, denies symptoms of palpitations, chest pain, shortness of breath, orthopnea, PND, lower extremity edema, claudication, dizziness, presyncope, syncope, bleeding, or neurologic sequela. The patient is tolerating medications without difficulties.  She is doing well.  She has no chest pain or shortness of breath.  Stable to all of her daily activities without restriction.  Physically she states that she is doing well.  Mentally she has been struggling over the last year.  Her daughter died approximately a year ago.  Her granddaughter was also diagnosed with myasthenia gravis and has been in the hospital multiple times since June at times requiring intubation.    Past Medical History:  Diagnosis Date   AICD (automatic cardioverter/defibrillator) present    Allergic reaction to alpha-gal    Allergy 03/05/2022   Anemia    Anxiety state 03/25/2016   Arthritis    Basal cell carcinoma of forehead    Brain aneurysm    CHF (congestive heart failure) ([name])    Coronary artery disease    a. 03/11/16 PCI with DES-->Prox/Mid Cx;  b. 03/14/16 PCI with DES x2-->RCA, EF 30-35%.   Depression 06/09/2010   Encounter for general adult medical examination  with abnormal findings 05/04/2022   Essential hypertension    GERD (gastroesophageal reflux disease)    HFrEF (heart failure with reduced ejection fraction) ([place])    a. 10/2016 Echo: EF 35-40%, Gr1 DD, mild focal basal septal hypertrophy, basal inflat, mid inflat, basal antlat AK. Mid infept/inf/antlat, apical lateral sev HK. Mod MR. mildly reduced RV fxn. Mild TR.   History of pneumonia    Hyperlipidemia    IBS (irritable bowel syndrome)    Ischemic cardiomyopathy    a. 10/2016 Echo: EF 35-40%, Gr1 DD.   Mitral regurgitation    Neuromuscular disorder (HCC)    NSTEMI (non-ST elevated myocardial infarction) ([place]) 03/10/2016   Pneumonia 03/2016   Shingles    Squamous cell cancer of skin of nose    Thrombocytosis 03/26/2016   Tobacco abuse    Trichimoniasis    Wears dentures    Wears glasses    Past Surgical History:  Procedure Laterality Date   APPENDECTOMY     BIOPSY  09/20/2018   Procedure: BIOPSY;  Surgeon: Daneil Dolin, MD;  [facility];  Service: Endoscopy;;  colon   BIOPSY  01/05/2021   Procedure: BIOPSY;  Surgeon: Milus Banister, MD;  Location: [facility] ENDOSCOPY;  Service: Endoscopy;;   BIOPSY  04/27/2022   Procedure: BIOPSY;  Surgeon: Eloise Harman, DO;  [facility];  Service: Endoscopy;;   CARDIAC CATHETERIZATION N/A 03/11/2016   Procedure: Left Heart Cath and Coronary Angiography;  Surgeon: Leonie Man, MD;  Location: [facility];  Service: Cardiovascular;  Laterality: N/A;   CARDIAC CATHETERIZATION N/A 03/11/2016   Procedure: Coronary Stent Intervention;  Surgeon: Leonie Man, MD;  Location: [place] [facility];  Service: Cardiovascular;  Laterality: N/A;   CARDIAC CATHETERIZATION N/A 03/14/2016   Procedure: Coronary Stent Intervention;  Surgeon: Peter M Martinique, MD;  Location: [place];  Service: Cardiovascular;  Laterality: N/A;   CEREBRAL ANEURYSM REPAIR  04/2019   stent placed   CHOLECYSTECTOMY OPEN  1984    COLONOSCOPY WITH PROPOFOL N/A 09/20/2018   Procedure: COLONOSCOPY WITH PROPOFOL;  Surgeon: Daneil Dolin, MD;  [facility];  Service: Endoscopy;  Laterality: N/A;  10:30am   CORONARY ANGIOPLASTY WITH STENT PLACEMENT  03/14/2016   ESOPHAGOGASTRODUODENOSCOPY (EGD) WITH PROPOFOL N/A 09/20/2018   Procedure: ESOPHAGOGASTRODUODENOSCOPY (EGD) WITH PROPOFOL;  Surgeon: Daneil Dolin, MD;  [facility];  Service: Endoscopy;  Laterality: N/A;   ESOPHAGOGASTRODUODENOSCOPY (EGD) WITH PROPOFOL N/A 01/05/2021   Procedure: ESOPHAGOGASTRODUODENOSCOPY (EGD) WITH PROPOFOL;  Surgeon: Milus Banister, MD;  Location: [facility] ENDOSCOPY;  Service: Endoscopy;  Laterality: N/A;   ESOPHAGOGASTRODUODENOSCOPY (EGD) WITH PROPOFOL N/A 04/27/2022   Procedure: ESOPHAGOGASTRODUODENOSCOPY (EGD) WITH PROPOFOL;  Surgeon: Eloise Harman, DO;  [facility];  Service: Endoscopy;  Laterality: N/A;  9:15am, asa 3/4, ASAP   FINGER ARTHROPLASTY Left 05/14/2013   Procedure: LEFT THUMB CARPAL METACARPAL ARTHROPLASTY;  Surgeon: Tennis Must, MD;  Location: [place];  Service: Orthopedics;  Laterality: Left;   ICD IMPLANT N/A 04/03/2020   Procedure: ICD IMPLANT;  Surgeon: Constance Haw, MD;  Location: [place] [facility];  Service: Cardiovascular;  Laterality: N/A;   IR ANGIO INTRA EXTRACRAN SEL COM CAROTID INNOMINATE BILAT MOD SED  01/05/2017   IR ANGIO INTRA EXTRACRAN SEL COM CAROTID INNOMINATE BILAT MOD SED  03/19/2019   IR ANGIO INTRA EXTRACRAN SEL COM CAROTID INNOMINATE BILAT MOD SED  06/04/2020   IR ANGIO INTRA EXTRACRAN SEL INTERNAL CAROTID UNI L MOD SED  04/15/2019   IR ANGIO VERTEBRAL SEL VERTEBRAL BILAT MOD SED  01/05/2017   IR ANGIO VERTEBRAL SEL VERTEBRAL BILAT MOD SED  03/19/2019   IR ANGIO VERTEBRAL SEL VERTEBRAL UNI L MOD SED  06/04/2020   IR ANGIOGRAM FOLLOW UP STUDY  04/15/2019   IR RADIOLOGIST EVAL & MGMT  12/30/2016   IR TRANSCATH/EMBOLIZ  04/15/2019   IR US  GUIDE VASC ACCESS RIGHT  03/19/2019   IR US GUIDE VASC ACCESS RIGHT  06/04/2020   JOINT REPLACEMENT  05-15-2013   MALONEY DILATION N/A 09/20/2018   Procedure: Venia Minks DILATION;  Surgeon: Daneil Dolin, MD;  [facility];  Service: Endoscopy;  Laterality: N/A;   RADIOLOGY WITH ANESTHESIA N/A 04/15/2019   Procedure: [employer];  Surgeon: Luanne Bras, MD;  Location: [place];  Service: Radiology;  Laterality: N/A;   RIGHT/LEFT HEART CATH AND CORONARY ANGIOGRAPHY N/A 08/19/2019   Procedure: RIGHT/LEFT HEART CATH AND CORONARY ANGIOGRAPHY;  Surgeon: Larey Dresser, MD;  Location: [place] [facility];  Service: Cardiovascular;  Laterality: N/A;   TUBAL LIGATION  1987   VAGINAL HYSTERECTOMY  2009     Current Outpatient Medications  Medication Sig Dispense Refill   aspirin EC 81 MG tablet Take 81 mg by mouth every evening.      atorvastatin (LIPITOR) 80 MG tablet TAKE 1 TABLET BY MOUTH ONCE DAILY AT  6PM 90 tablet 3   carvedilol (COREG) 6.25 MG tablet Take 1 tablet (6.25 mg total) by mouth 2 (two) times  daily. 180 tablet 1   clopidogrel (PLAVIX) 75 MG tablet Take 1 tablet by mouth once daily 30 tablet 0   dapagliflozin propanediol (FARXIGA) 10 MG TABS tablet Take 10 mg by mouth daily.     EPINEPHrine (EPIPEN 2-PAK) 0.3 mg/0.3 mL IJ SOAJ injection Inject 0.3 mg into the muscle as needed for anaphylaxis. 2 each 2   ezetimibe (ZETIA) 10 MG tablet Take 1 tablet by mouth once daily 30 tablet 11   furosemide (LASIX) 20 MG tablet Take 40 mg daily alternating with 20 mg daily 180 tablet 3   gabapentin (NEURONTIN) 300 MG capsule Take 1 capsule (300 mg total) by mouth at bedtime. 30 capsule 2   icosapent Ethyl ([name]) 1 g capsule Take 2 capsules (2 g total) by mouth 2 (two) times daily. 120 capsule 11   loperamide (IMODIUM) 2 MG capsule Take 2 mg by mouth 4 (four) times daily as needed for diarrhea or loose stools (ibs).     losartan (COZAAR) 25 MG tablet Take 1 tablet by mouth once daily  30 tablet 6   Multiple Vitamins-Minerals (MULTIVITAMIN WITH MINERALS) tablet Take 1 tablet by mouth daily.     nitroGLYCERIN (NITROSTAT) 0.4 MG SL tablet Place 1 tablet (0.4 mg total) under the tongue every 5 (five) minutes x 3 doses as needed for chest pain (if no relief after 2nd dose, proceed to the ED for an evaluation or call 911). 25 tablet 2   pantoprazole (PROTONIX) 40 MG tablet Take 1 tablet (40 mg total) by mouth 2 (two) times daily before a meal. 60 tablet 3   potassium chloride SA (KLOR-CON M) 20 MEQ tablet Take 2 tablets (40 mEq total) by mouth daily. 60 tablet 6   rOPINIRole (REQUIP) 0.5 MG tablet Take 0.5 mg by mouth at bedtime.   0   spironolactone (ALDACTONE) 25 MG tablet Take 1 tablet (25 mg total) by mouth at bedtime. 90 tablet 3   sucralfate (CARAFATE) 1 GM/10ML suspension Take 10 mLs (1 g total) by mouth 4 (four) times daily. 420 mL 1   Vitamin D, Ergocalciferol, (DRISDOL) 1.25 MG (50000 UNIT) CAPS capsule Take 50,000 Units by mouth once a week. Takes on Mondays.     No current facility-administered medications for this visit.    Allergies:   Alpha-gal, Other, and Tape   Social History:  The patient  reports that she has been smoking cigarettes. She has never used smokeless tobacco. She reports current alcohol use. She reports current drug use. Drug: Marijuana.   Family History:  The patient's family history includes Anxiety disorder in her daughter and daughter; Arthritis in her father; Breast cancer in her maternal grandmother; CAD in her father; COPD in her father; Cancer in her maternal grandfather and paternal grandmother; Colon polyps (age of onset: 21) in her father; Dementia in her father; Depression in her daughter; Diabetes in her father, maternal grandmother, and mother; Heart attack in her father and mother; Heart disease in her father; Heart failure in an other family member; Hyperlipidemia in her father; Hypertension in her father and mother; Stroke in her father  and mother.   ROS:  Please see the history of present illness.   Otherwise, review of systems is positive for none.   All other systems are reviewed and negative.   PHYSICAL EXAM: VS:  BP 98/60   Pulse 97   Ht 5' (1.524 m)   Wt 129 lb 3.2 oz (58.6 kg)   SpO2 98%   BMI  25.23 kg/m  , BMI Body mass index is 25.23 kg/m. GEN: Well nourished, well developed, in no acute distress  HEENT: normal  Neck: no JVD, carotid bruits, or masses Cardiac: RRR; no murmurs, rubs, or gallops,no edema  Respiratory:  clear to auscultation bilaterally, normal work of breathing GI: soft, nontender, nondistended, + BS MS: no deformity or atrophy  Skin: warm and dry, device site well healed Neuro:  Strength and sensation are intact Psych: euthymic mood, full affect  EKG:  EKG is not ordered today. Personal review of the ekg ordered 04/13/22 shows sinus rhythm  Personal review of the device interrogation today. Results in [place]: 09/09/2021: ALT 19 12/25/2021: B Natriuretic Peptide 69.0 04/13/2022: Hemoglobin 13.6; Platelets 339 04/26/2022: BUN 13; Creatinine, Ser 0.97; Potassium 3.8; Sodium 138; TSH 0.848    Lipid Panel     Component Value Date/Time   CHOL 153 03/07/2022 1006   TRIG 286 (H) 03/07/2022 1006   HDL 42 03/07/2022 1006   CHOLHDL 3.6 03/07/2022 1006   VLDL 57 (H) 03/07/2022 1006   LDLCALC 54 03/07/2022 1006     Wt Readings from Last 3 Encounters:  06/09/22 129 lb 3.2 oz (58.6 kg)  04/27/22 128 lb 12 oz (58.4 kg)  04/26/22 131 lb 12.8 oz (59.8 kg)      Other studies Reviewed: Additional studies/ records that were reviewed today include: TTE 03/20/20  Review of the above records today demonstrates:   1. Left ventricular ejection fraction, by estimation, is 30 to 35%. The  left ventricle has moderately decreased function. The left ventricle  demonstrates regional wall motion abnormalities (see scoring  diagram/findings for description). The left  ventricular  internal cavity size was moderately to severely dilated. There  is mild asymmetric left ventricular hypertrophy. Left ventricular  diastolic parameters are consistent with Grade II diastolic dysfunction  (pseudonormalization). There is severe  dyskinesis of the left ventricular, basal-mid inferior wall.   2. Right ventricular systolic function is normal. The right ventricular  size is normal. There is normal pulmonary artery systolic pressure.   3. The mitral valve is grossly normal. No evidence of mitral valve  regurgitation.   4. The aortic valve is normal in structure. Aortic valve regurgitation is  not visualized.   Right and left heart catheterization 08/19/2019 1. Normal filling pressures.  2. Preserved cardiac output.  3. No obstructive CAD, patent stents.    ASSESSMENT AND PLAN:  1.  Chronic systolic heart failure: Due to ischemic cardiomyopathy.  Currently on optimal medical therapy per primary cardiology.  Status post Medtronic ICD implanted 04/03/2020.  Device functioning appropriately.  No changes at this time.  2.  Coronary artery disease: Status post non-STEMI in 2017 with stents to the circumflex and RCA.  No current chest pain.  Plan per primary cardiology.  3.  Tobacco abuse: She has quit  Current medicines are reviewed at length with the patient today.   The patient does not have concerns regarding her medicines.  The following changes were made today:  none  Labs/ tests ordered today include:  No orders of the defined types were placed in this encounter.    Disposition:   FU 12 months  Signed, Jaycee Mckellips Meredith Leeds, MD  06/09/2022 2:43 PM     [address] [place] [address] [phone] (office) [phone] (fax)

## 2022-06-13 ENCOUNTER — Encounter (HOSPITAL_COMMUNITY): Payer: Self-pay | Admitting: Cardiology

## 2022-06-13 ENCOUNTER — Telehealth (HOSPITAL_COMMUNITY): Payer: Self-pay | Admitting: *Deleted

## 2022-06-13 ENCOUNTER — Other Ambulatory Visit (HOSPITAL_COMMUNITY): Payer: Self-pay

## 2022-06-13 ENCOUNTER — Other Ambulatory Visit: Payer: Self-pay

## 2022-06-13 ENCOUNTER — Ambulatory Visit (HOSPITAL_COMMUNITY)
Admission: RE | Admit: 2022-06-13 | Discharge: 2022-06-13 | Disposition: A | Discharge status: Home / Self Care | Payer: 59 | Source: Ambulatory Visit | Attending: Cardiology | Admitting: Cardiology

## 2022-06-13 ENCOUNTER — Ambulatory Visit (HOSPITAL_BASED_OUTPATIENT_CLINIC_OR_DEPARTMENT_OTHER)
Admission: RE | Admit: 2022-06-13 | Discharge: 2022-06-13 | Disposition: A | Discharge status: Home / Self Care | Payer: 59 | Source: Ambulatory Visit | Attending: Cardiology | Admitting: Cardiology

## 2022-06-13 VITALS — BP 148/100 | HR 86 | Ht 60.0 in | Wt 131.2 lb

## 2022-06-13 DIAGNOSIS — I5042 Chronic combined systolic (congestive) and diastolic (congestive) heart failure: Secondary | ICD-10-CM | POA: Insufficient documentation

## 2022-06-13 DIAGNOSIS — I2510 Atherosclerotic heart disease of native coronary artery without angina pectoris: Secondary | ICD-10-CM | POA: Insufficient documentation

## 2022-06-13 DIAGNOSIS — R079 Chest pain, unspecified: Secondary | ICD-10-CM | POA: Insufficient documentation

## 2022-06-13 DIAGNOSIS — Z955 Presence of coronary angioplasty implant and graft: Secondary | ICD-10-CM | POA: Diagnosis not present

## 2022-06-13 DIAGNOSIS — R9431 Abnormal electrocardiogram [ECG] [EKG]: Secondary | ICD-10-CM | POA: Insufficient documentation

## 2022-06-13 DIAGNOSIS — I959 Hypotension, unspecified: Secondary | ICD-10-CM | POA: Diagnosis not present

## 2022-06-13 DIAGNOSIS — I5022 Chronic systolic (congestive) heart failure: Secondary | ICD-10-CM

## 2022-06-13 DIAGNOSIS — I255 Ischemic cardiomyopathy: Secondary | ICD-10-CM | POA: Diagnosis not present

## 2022-06-13 DIAGNOSIS — I252 Old myocardial infarction: Secondary | ICD-10-CM | POA: Insufficient documentation

## 2022-06-13 DIAGNOSIS — F17210 Nicotine dependence, cigarettes, uncomplicated: Secondary | ICD-10-CM | POA: Diagnosis not present

## 2022-06-13 DIAGNOSIS — Z7902 Long term (current) use of antithrombotics/antiplatelets: Secondary | ICD-10-CM | POA: Insufficient documentation

## 2022-06-13 DIAGNOSIS — R55 Syncope and collapse: Secondary | ICD-10-CM | POA: Diagnosis not present

## 2022-06-13 DIAGNOSIS — E781 Pure hyperglyceridemia: Secondary | ICD-10-CM

## 2022-06-13 DIAGNOSIS — I110 Hypertensive heart disease with heart failure: Secondary | ICD-10-CM | POA: Diagnosis not present

## 2022-06-13 DIAGNOSIS — E785 Hyperlipidemia, unspecified: Secondary | ICD-10-CM | POA: Insufficient documentation

## 2022-06-13 DIAGNOSIS — Z79899 Other long term (current) drug therapy: Secondary | ICD-10-CM | POA: Diagnosis not present

## 2022-06-13 LAB — ECHOCARDIOGRAM COMPLETE
Area-P 1/2: 5.02 cm2
Calc EF: 32.8 %
S' Lateral: 4.5 cm
Single Plane A2C EF: 36.3 %
Single Plane A4C EF: 28.8 %

## 2022-06-13 LAB — BASIC METABOLIC PANEL
Anion gap: 11 (ref 5–15)
BUN: 10 mg/dL (ref 6–20)
CO2: 22 mmol/L (ref 22–32)
Calcium: 9.4 mg/dL (ref 8.9–10.3)
Chloride: 102 mmol/L (ref 98–111)
Creatinine, Ser: 0.97 mg/dL (ref 0.44–1.00)
GFR, Estimated: >60 mL/min (ref >60)
Glucose, Bld: 93 mg/dL (ref 70–99)
Potassium: 3.7 mmol/L (ref 3.5–5.1)
Sodium: 135 mmol/L (ref 135–145)

## 2022-06-13 LAB — BRAIN NATRIURETIC PEPTIDE: B Natriuretic Peptide: 63.7 pg/mL (ref 0.0–100.0)

## 2022-06-13 LAB — LIPID PANEL
Cholesterol: 172 mg/dL (ref 0–200)
HDL: 44 mg/dL (ref >40)
LDL Cholesterol: 78 mg/dL (ref 0–99)
Total CHOL/HDL Ratio: 3.9 RATIO
Triglycerides: 249 mg/dL — ABNORMAL HIGH (ref <150)
VLDL: 50 mg/dL — ABNORMAL HIGH (ref 0–40)

## 2022-06-13 MED ORDER — ENTRESTO 24-26 MG PO TABS: 1.0000 | ORAL_TABLET | Freq: Two times a day (BID) | ORAL | 11 refills | Status: DC

## 2022-06-13 MED ORDER — FUROSEMIDE 20 MG PO TABS: 20.0000 mg | ORAL_TABLET | Freq: Every day | ORAL | 3 refills | Status: DC

## 2022-06-13 MED ORDER — BUPROPION HCL ER (SR) 150 MG PO TB12: ORAL_TABLET | ORAL | 3 refills | Status: DC

## 2022-06-13 NOTE — Progress Notes (Signed)
  Echocardiogram 2D Echocardiogram has been performed.  Erin [name] 06/13/2022, 9:45 AM

## 2022-06-13 NOTE — Progress Notes (Signed)
PCP: Johnette Abraham, MD HF Cardiology: [name]  62 y.o. with history of CAD, ischemic cardiomyopathy, and PAD was referred by [name] for evaluation of CHF.  She had NSTEMI in 11/17 with PCI to prox/mid LCx and RCA.  Subsequently, has developed ischemic cardiomyopathy.  Most recent echo in 1/21 showed EF 30-35%.  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   RHC/LHC done with exertional chest heaviness and dyspnea in 4/21 showed normal filling pressures, preserved cardiac output, and nonobstructive mild CAD.  ABIs were normal in 4/21.   In 8/21, she had syncope thought to be related to orthostasis from low BP in the setting of cardiac medication titration.  Entresto was decreased.   She was in the ER in 11/21 with chest pain.  Troponin and COVID-19 negative. Echo in 11/21 showed EF 30-35%, normal RV.   She was hospitalized in 6/22 with left-sided weakness/numbness.  No evidence for acute CVA, ?TIA.    Echo in 11/22 showed EF 30-35% with mild LV dilation, normal RV, mild-moderate MR, IVC normal.   Echo was done today and reviewed, EF remains 30-35% with normal RV.   Patient returns for followup of CHF.  No dyspnea walking on flat ground.  No problems walking up a flight of stairs.  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  No chest pain.  She is still smoking.  Under stress because her grand-daughter is sick in the hospital. BP elevated, she has not taken her meds today.  Weight up 4 lbs.   ECG (personally reviewed): NSR, normal  Labs (2/21): K 3.7, creatinine 0.82 Labs (4/21): K 4.3, creatinine 0.81, LDL 67, TGs 286 Labs (6/21): K 4.7, creatinine 1.22 Labs (11/21): K 3.7, creatinine 0.84, hgb 12.6, LDL 67, HDL 48, TGs 286 Labs (1/22). K 4.4, creatinine 1.03 Labs (6/22): K 3.5, creatinine 1.0 Labs (11/23): K 3.1, creatinine 0.97 Labs (12/23): K 3.8, creatinine 0.97  Medtronic device interrogation:  No AF/VT.  Stable  thoracic impedance.    PMH: 1. CAD: NSTEMI with DES to proximal and mid LCx in 11/17, staged DES x 2 to RCA later in 11/17.  - Cardiolite (10/20): EF < 30%, large inferior and inferolateral MI with mild peri-infarct ischemia.  - LHC (4/21): Patent stents, nonobstructive CAD.  2. Chronic systolic CHF: Ischemic cardiomyopathy. Medtronic ICD.  - Echo (10/20): EF 35-40%, lateral WMAs, normal RV. - Echo (1/21): EF 30-35%, mild LVH, normal RV - RHC (4/21): mean RA 5, PA 29/3, mean PCWP 12, CI 3.04 - Echo (11/21): EF 30-35%, normal RV.  - Echo (11/22): EF 30-35% with mild LV dilation, normal RV, mild-moderate MR, IVC normal.  - Echo (2/24): EF 30-35%, normal RV.  3. 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  4. Carotid stenosis: Carotid dopplers (37/34) with 28-76% LICA stenosis.  - Carotid dopplers (6/21): 40-59% BICA stenosis.  - Carotid dopplers (1/22): 81-15% RICA, 72-62% LICA.  - Carotid dopplers (0/35): 59-74% LICA - Carotid dopplers (16/38): 45-36% LICA 5. Prior smoker.  6. PAD: ABIs (4/21) Normal.  - Peripheral artery dopplers (12/21): bilateral plaque without focal stenosis.   Social History   Socioeconomic History   Marital status: Married    Spouse name: Not on file   Number of children: Not on file   Years of education: Not on file   Highest education level: Not on file  Occupational History   Occupation: CNA  Tobacco Use  Smoking status: Light Smoker    Packs/day: 0.00    Years: 15.00    Total pack years: 0.00    Types: Cigarettes   Smokeless tobacco: Never   Tobacco comments:    smokes a cigarette occasionally  Vaping Use   Vaping Use: Never used  Substance and Sexual Activity   Alcohol use: Yes    Comment: occasionally   Drug use: Yes    Types: Marijuana    Comment: former- 2017 last time   Sexual activity: Not Currently    Birth control/protection: Surgical    Comment: hyst  Other Topics Concern   Not on file  Social  History Narrative   Lives with husband in [place] in a one story home with a basement.  Has 4 children.  Works as a Quarry manager.  Education: CNA school.    Social Determinants of Health   Financial Resource Strain: Low Risk  (06/11/2021)   Overall Financial Resource Strain (CARDIA)    Difficulty of Paying Living Expenses: Not hard at all  Food Insecurity: No Food Insecurity (06/11/2021)   Hunger Vital Sign    Worried About Running Out of Food in the Last Year: Never true    Ran Out of Food in the Last Year: Never true  Transportation Needs: No Transportation Needs (06/11/2021)   PRAPARE - Hydrologist (Medical): No    Lack of Transportation (Non-Medical): No  Physical Activity: Insufficiently Active (06/11/2021)   Exercise Vital Sign    Days of Exercise per Week: 4 days    Minutes of Exercise per Session: 30 min  Stress: Stress Concern Present (06/11/2021)   [place]    Feeling of Stress : Very much  Social Connections: Socially Isolated (06/11/2021)   Social Connection and Isolation Panel [NHANES]    Frequency of Communication with Friends and Family: Once a week    Frequency of Social Gatherings with Friends and Family: Never    Attends Religious Services: Never    Marine scientist or Organizations: No    Attends Archivist Meetings: Never    Marital Status: Married  Human resources officer Violence: Not At Risk (06/11/2021)   Humiliation, Afraid, Rape, and Kick questionnaire    Fear of Current or Ex-Partner: No    Emotionally Abused: No    Physically Abused: No    Sexually Abused: No   Family History  Problem Relation Age of Onset   Stroke Mother    Hypertension Mother    Diabetes Mother    Heart attack Mother    Heart attack Father    Diabetes Father    Hypertension Father    CAD Father    Colon polyps Father 53       pre-cancerous    Stroke Father    Dementia Father     Hyperlipidemia Father    Arthritis Father    COPD Father    Heart disease Father    Breast cancer Maternal Grandmother    Diabetes Maternal Grandmother    Cancer Maternal Grandfather        Tongue and esophageal   Anxiety disorder Daughter    Depression Daughter    Anxiety disorder Daughter    Heart failure Other    Cancer Paternal Grandmother    Colon cancer Neg Hx    ROS: All systems reviewed and negative except as per HPI.   Current Outpatient Medications  Medication Sig Dispense  Refill   aspirin EC 81 MG tablet Take 81 mg by mouth every evening.      atorvastatin (LIPITOR) 80 MG tablet TAKE 1 TABLET BY MOUTH ONCE DAILY AT  6PM 90 tablet 3   buPROPion (WELLBUTRIN SR) 150 MG 12 hr tablet Take 1 Tab daily for 3 days, then 2 Tab Daily 180 tablet 3   carvedilol (COREG) 6.25 MG tablet Take 1 tablet (6.25 mg total) by mouth 2 (two) times daily. 180 tablet 1   clopidogrel (PLAVIX) 75 MG tablet Take 1 tablet by mouth once daily 30 tablet 0   dapagliflozin propanediol (FARXIGA) 10 MG TABS tablet Take 10 mg by mouth daily.     ezetimibe (ZETIA) 10 MG tablet Take 1 tablet by mouth once daily 30 tablet 11   gabapentin (NEURONTIN) 300 MG capsule Take 1 capsule (300 mg total) by mouth at bedtime. 30 capsule 2   icosapent Ethyl ([name]) 1 g capsule Take 2 capsules (2 g total) by mouth 2 (two) times daily. 120 capsule 11   loperamide (IMODIUM) 2 MG capsule Take 2 mg by mouth 4 (four) times daily as needed for diarrhea or loose stools (ibs).     Multiple Vitamins-Minerals (MULTIVITAMIN WITH MINERALS) tablet Take 1 tablet by mouth daily.     pantoprazole (PROTONIX) 40 MG tablet Take 1 tablet (40 mg total) by mouth 2 (two) times daily before a meal. 60 tablet 3   potassium chloride SA (KLOR-CON M) 20 MEQ tablet Take 2 tablets (40 mEq total) by mouth daily. 60 tablet 6   rOPINIRole (REQUIP) 0.5 MG tablet Take 0.5 mg by mouth at bedtime.   0   sacubitril-valsartan (ENTRESTO) 24-26 MG Take 1 tablet by  mouth 2 (two) times daily. 60 tablet 11   spironolactone (ALDACTONE) 25 MG tablet Take 1 tablet (25 mg total) by mouth at bedtime. 90 tablet 3   sucralfate (CARAFATE) 1 GM/10ML suspension Take 10 mLs (1 g total) by mouth 4 (four) times daily. 420 mL 1   Vitamin D, Ergocalciferol, (DRISDOL) 1.25 MG (50000 UNIT) CAPS capsule Take 50,000 Units by mouth once a week. Takes on Mondays.     EPINEPHrine (EPIPEN 2-PAK) 0.3 mg/0.3 mL IJ SOAJ injection Inject 0.3 mg into the muscle as needed for anaphylaxis. (Patient not taking: Reported on 06/13/2022) 2 each 2   furosemide (LASIX) 20 MG tablet Take 1 tablet (20 mg total) by mouth daily. 90 tablet 3   nitroGLYCERIN (NITROSTAT) 0.4 MG SL tablet Place 1 tablet (0.4 mg total) under the tongue every 5 (five) minutes x 3 doses as needed for chest pain (if no relief after 2nd dose, proceed to the ED for an evaluation or call 911). (Patient not taking: Reported on 06/13/2022) 25 tablet 2   No current facility-administered medications for this encounter.   BP (!) 148/100   Pulse 86   Ht 5' (1.524 m)   Wt 59.5 kg (131 lb 3.2 oz)   SpO2 100%   BMI 25.62 kg/m  General: NAD Neck: No JVD, no thyromegaly or thyroid nodule.  Lungs: Occasional rhonchi.  CV: Nondisplaced PMI.  Heart regular S1/S2, no S3/S4, no murmur.  No peripheral edema.  No carotid bruit.  Normal pedal pulses.  Abdomen: Soft, nontender, no hepatosplenomegaly, no distention.  Skin: Intact without lesions or rashes.  Neurologic: Alert and oriented x 3.  Psych: Normal affect. Extremities: No clubbing or cyanosis.  HEENT: Normal.   Assessment/Plan: 1. CAD: S/p NSTEMI in 11/17 with DEX to  LCx and DES x 2 to RCA.  Cardiolite in 10/20 with EF <30%, inferior/inferolateral infarct with peri-infarct ischemia. LHC in 4/21 showed nonobstructive mild CAD.  No chest pain.  - Continue atorvastatin and Zetia. Check lipids today.  - Continue ASA 81 and Plavix 75 mg daily for now.  She will continue Plavix as long  as Dr. Estanislado Pandy feels it is needed post her cranial circulation intervention.  2. Chronic systolic CHF: Ischemic cardiomyopathy. Echo today showed stable EF 30-35%.  RHC in 4/21 with normal filling pressures and preserved cardiac output.  NYHA class II symptoms.  She is not volume overloaded by exam or Optivol.  - She felt better when taking Entresto and is willing to try it again.  I will have her stop losartan and start Entresto 24/26 bid. BMET/BNP today and BMET in 10 days.  - With starting Entresto, decrease Lasix to 20 mg daily.   - Continue Coreg 6.25 mg bid.      - Continue spironolactone 25 mg daily.   - Continue dapagliflozin 10 mg daily.   3. Carotid stenosis: Repeat carotids in 12/24.  4. Smoking: She wants to quit, had bad dreams with Chantix.  - I will start her on Wellbutrin.  5. 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  - Continue Plavix per Dr. Estanislado Pandy.   Followup 3 wks with HF pharmacist for med titration, followup 3 months with APP.    Loralie Champagne 06/13/2022

## 2022-06-13 NOTE — Patient Instructions (Addendum)
STOP Losartan   CHANGE Lasix to 20 mg daily.  START Entresto 24/26 mg Twice daily  START Wellbutrin as directed on the bottle.  Labs done today, your results will be available in MyChart, we will contact you for abnormal readings.  Repeat blood work in 10 days. Please follow up with our heart failure pharmacist in 3 weeks  Your physician recommends that you schedule a follow-up appointment in: 3 months  If you have any questions or concerns before your next appointment please send [place] a message through [place] or call our office at [phone].    TO LEAVE A MESSAGE FOR THE NURSE SELECT OPTION 2, PLEASE LEAVE A MESSAGE INCLUDING: YOUR NAME DATE OF BIRTH CALL BACK NUMBER REASON FOR CALL**this is important as we prioritize the call backs  YOU WILL RECEIVE A CALL BACK THE SAME DAY AS LONG AS YOU CALL BEFORE 4:00 PM  At the [facility], you and your health needs are our priority. As part of our continuing mission to provide you with exceptional heart care, we have created designated Provider Care Teams. These Care Teams include your primary Cardiologist (physician) and Advanced Practice Providers (APPs- Physician Assistants and Nurse Practitioners) who all work together to provide you with the care you need, when you need it.   You may see any of the following providers on your designated Care Team at your next follow up: Dr Glori Bickers Dr Loralie Champagne Dr. Roxana Hires, NP Lyda Jester, [name] [facility] [place], [name] Forestine Na, NP [name], PharmD   Please be sure to bring in all your medications bottles to every appointment.    Thank you for choosing [facility]

## 2022-06-13 NOTE — Telephone Encounter (Signed)
Called patient per [name] with following lab result and instructions:  "LDL higher than ideal.  Refer to Lipid clinic to see if we can get her on Repatha instead of Zetia".  Referral sent to Lipid Clinic for evaluation and initiation of Repatha. Lipid Clinic will reach out to patient to schedule appointment.   Pt verbalized understanding and agreement to above.

## 2022-06-22 ENCOUNTER — Encounter: Payer: Self-pay | Admitting: Cardiology

## 2022-06-23 ENCOUNTER — Encounter (HOSPITAL_COMMUNITY): Payer: Self-pay | Admitting: Cardiology

## 2022-06-23 ENCOUNTER — Ambulatory Visit (HOSPITAL_COMMUNITY)
Admission: RE | Admit: 2022-06-23 | Discharge: 2022-06-23 | Disposition: A | Discharge status: Home / Self Care | Payer: 59 | Source: Ambulatory Visit | Attending: Internal Medicine | Admitting: Internal Medicine

## 2022-06-23 DIAGNOSIS — I5042 Chronic combined systolic (congestive) and diastolic (congestive) heart failure: Secondary | ICD-10-CM | POA: Diagnosis present

## 2022-06-23 LAB — BASIC METABOLIC PANEL
Anion gap: 9 (ref 5–15)
BUN: 11 mg/dL (ref 6–20)
CO2: 23 mmol/L (ref 22–32)
Calcium: 9.6 mg/dL (ref 8.9–10.3)
Chloride: 106 mmol/L (ref 98–111)
Creatinine, Ser: 1.15 mg/dL — ABNORMAL HIGH (ref 0.44–1.00)
GFR, Estimated: 55 mL/min — ABNORMAL LOW (ref >60)
Glucose, Bld: 94 mg/dL (ref 70–99)
Potassium: 4.6 mmol/L (ref 3.5–5.1)
Sodium: 138 mmol/L (ref 135–145)

## 2022-06-28 NOTE — Progress Notes (Unsigned)
GI Office Note    Referring Provider: Johnette Abraham, MD Primary Care Physician:  Johnette Abraham, MD  Primary Gastroenterologist:  Chief Complaint   No chief complaint on file.   History of Present Illness   [name] is a 62 y.o. female presenting today for follow up. Last seen in 04/2022 for odynophagia.      She has been under a lot of stress. Her daughter passed away in 06-26-21. She if fighting for custody of her grandchild. Also her 49 y/o granddaughter diagnosed with Myathenia Gravis and required prolonged hospitalization. Still with significant limitations.    EGD 04/2022:  -small hh -esophageal ulcer, bx c/w ulcer, no viral cytopathic effects -gastritis, slight chronic inflammation. No h.pylori  GES 05/2021: normal   EGD in August 2022 for nausea and vomiting: -Mild, nonspecific distal gastritis.  Biopsies taken to check for H. pylori, were positive (treated with 10-day course of Pylera equivalent with confirmed eradication by stool Ag) -The examination was overall normal   Colonoscopy May 2020 for diarrhea: -Submucosal nodule in the ascending colon consistent with lipoma -Distal 5 cm of terminal ileum normal -Segmental right and left colon biopsies negative for microscopic colitis -Next colonoscopy in 10 years for screening purposes    Medications   Current Outpatient Medications  Medication Sig Dispense Refill   aspirin EC 81 MG tablet Take 81 mg by mouth every evening.      atorvastatin (LIPITOR) 80 MG tablet TAKE 1 TABLET BY MOUTH ONCE DAILY AT  6PM 90 tablet 3   buPROPion (WELLBUTRIN SR) 150 MG 12 hr tablet Take 1 Tab daily for 3 days, then 2 Tab Daily 180 tablet 3   carvedilol (COREG) 6.25 MG tablet Take 1 tablet (6.25 mg total) by mouth 2 (two) times daily. 180 tablet 1   clopidogrel (PLAVIX) 75 MG tablet Take 1 tablet by mouth once daily 30 tablet 0   dapagliflozin propanediol (FARXIGA) 10 MG TABS tablet Take 10 mg by mouth daily.      EPINEPHrine (EPIPEN 2-PAK) 0.3 mg/0.3 mL IJ SOAJ injection Inject 0.3 mg into the muscle as needed for anaphylaxis. (Patient not taking: Reported on 06/13/2022) 2 each 2   ezetimibe (ZETIA) 10 MG tablet Take 1 tablet by mouth once daily 30 tablet 11   furosemide (LASIX) 20 MG tablet Take 1 tablet (20 mg total) by mouth daily. 90 tablet 3   gabapentin (NEURONTIN) 300 MG capsule Take 1 capsule (300 mg total) by mouth at bedtime. 30 capsule 2   icosapent Ethyl ([name]) 1 g capsule Take 2 capsules (2 g total) by mouth 2 (two) times daily. 120 capsule 11   loperamide (IMODIUM) 2 MG capsule Take 2 mg by mouth 4 (four) times daily as needed for diarrhea or loose stools (ibs).     Multiple Vitamins-Minerals (MULTIVITAMIN WITH MINERALS) tablet Take 1 tablet by mouth daily.     nitroGLYCERIN (NITROSTAT) 0.4 MG SL tablet Place 1 tablet (0.4 mg total) under the tongue every 5 (five) minutes x 3 doses as needed for chest pain (if no relief after 2nd dose, proceed to the ED for an evaluation or call 911). (Patient not taking: Reported on 06/13/2022) 25 tablet 2   pantoprazole (PROTONIX) 40 MG tablet Take 1 tablet (40 mg total) by mouth 2 (two) times daily before a meal. 60 tablet 3   potassium chloride SA (KLOR-CON M) 20 MEQ tablet Take 2 tablets (40 mEq total) by mouth daily. 60 tablet  6   rOPINIRole (REQUIP) 0.5 MG tablet Take 0.5 mg by mouth at bedtime.   0   sacubitril-valsartan (ENTRESTO) 24-26 MG Take 1 tablet by mouth 2 (two) times daily. 60 tablet 11   spironolactone (ALDACTONE) 25 MG tablet Take 1 tablet (25 mg total) by mouth at bedtime. 90 tablet 3   sucralfate (CARAFATE) 1 GM/10ML suspension Take 10 mLs (1 g total) by mouth 4 (four) times daily. 420 mL 1   Vitamin D, Ergocalciferol, (DRISDOL) 1.25 MG (50000 UNIT) CAPS capsule Take 50,000 Units by mouth once a week. Takes on Mondays.     No current facility-administered medications for this visit.    Allergies   Allergies as of 06/29/2022 - Review  Complete 06/09/2022  Allergen Reaction Noted   Alpha-gal Shortness Of Breath, Nausea And Vomiting, and Dermatitis 02/28/2022   Other Shortness Of Breath, Diarrhea, Nausea And Vomiting, and Nausea Only 03/02/2021   Tape Other (See Comments) 01/03/2017     Past Medical History   Past Medical History:  Diagnosis Date   AICD (automatic cardioverter/defibrillator) present    Allergic reaction to alpha-gal    Allergy 03/05/2022   Anemia    Anxiety state 03/25/2016   Arthritis    Basal cell carcinoma of forehead    Brain aneurysm    CHF (congestive heart failure) ([place])    Coronary artery disease    a. 03/11/16 PCI with DES-->Prox/Mid Cx;  b. 03/14/16 PCI with DES x2-->RCA, EF 30-35%.   Depression 06/09/2010   Encounter for general adult medical examination with abnormal findings 05/04/2022   Essential hypertension    GERD (gastroesophageal reflux disease)    HFrEF (heart failure with reduced ejection fraction) ([place])    a. 10/2016 Echo: EF 35-40%, Gr1 DD, mild focal basal septal hypertrophy, basal inflat, mid inflat, basal antlat AK. Mid infept/inf/antlat, apical lateral sev HK. Mod MR. mildly reduced RV fxn. Mild TR.   History of pneumonia    Hyperlipidemia    IBS (irritable bowel syndrome)    Ischemic cardiomyopathy    a. 10/2016 Echo: EF 35-40%, Gr1 DD.   Mitral regurgitation    Neuromuscular disorder (HCC)    NSTEMI (non-ST elevated myocardial infarction) ([place]) 03/10/2016   Pneumonia 03/2016   Shingles    Squamous cell cancer of skin of nose    Thrombocytosis 03/26/2016   Tobacco abuse    Trichimoniasis    Wears dentures    Wears glasses     Past Surgical History   Past Surgical History:  Procedure Laterality Date   APPENDECTOMY     BIOPSY  09/20/2018   Procedure: BIOPSY;  Surgeon: Daneil Dolin, MD;  [facility];  Service: Endoscopy;;  colon   BIOPSY  01/05/2021   Procedure: BIOPSY;  Surgeon: Milus Banister, MD;  Location: [facility] ENDOSCOPY;  Service:  Endoscopy;;   BIOPSY  04/27/2022   Procedure: BIOPSY;  Surgeon: Eloise Harman, DO;  [facility];  Service: Endoscopy;;   CARDIAC CATHETERIZATION N/A 03/11/2016   Procedure: Left Heart Cath and Coronary Angiography;  Surgeon: Leonie Man, MD;  Location: [place] [facility];  Service: Cardiovascular;  Laterality: N/A;   CARDIAC CATHETERIZATION N/A 03/11/2016   Procedure: Coronary Stent Intervention;  Surgeon: Leonie Man, MD;  Location: [place] [facility];  Service: Cardiovascular;  Laterality: N/A;   CARDIAC CATHETERIZATION N/A 03/14/2016   Procedure: Coronary Stent Intervention;  Surgeon: Peter M Martinique, MD;  Location: [place] [facility];  Service:  Cardiovascular;  Laterality: N/A;   CEREBRAL ANEURYSM REPAIR  04/2019   stent placed   CHOLECYSTECTOMY OPEN  1984   COLONOSCOPY WITH PROPOFOL N/A 09/20/2018   Procedure: COLONOSCOPY WITH PROPOFOL;  Surgeon: Daneil Dolin, MD;  [facility];  Service: Endoscopy;  Laterality: N/A;  10:30am   CORONARY ANGIOPLASTY WITH STENT PLACEMENT  03/14/2016   ESOPHAGOGASTRODUODENOSCOPY (EGD) WITH PROPOFOL N/A 09/20/2018   Procedure: ESOPHAGOGASTRODUODENOSCOPY (EGD) WITH PROPOFOL;  Surgeon: Daneil Dolin, MD;  [facility];  Service: Endoscopy;  Laterality: N/A;   ESOPHAGOGASTRODUODENOSCOPY (EGD) WITH PROPOFOL N/A 01/05/2021   Procedure: ESOPHAGOGASTRODUODENOSCOPY (EGD) WITH PROPOFOL;  Surgeon: Milus Banister, MD;  Location: [facility] ENDOSCOPY;  Service: Endoscopy;  Laterality: N/A;   ESOPHAGOGASTRODUODENOSCOPY (EGD) WITH PROPOFOL N/A 04/27/2022   Procedure: ESOPHAGOGASTRODUODENOSCOPY (EGD) WITH PROPOFOL;  Surgeon: Eloise Harman, DO;  [facility];  Service: Endoscopy;  Laterality: N/A;  9:15am, asa 3/4, ASAP   FINGER ARTHROPLASTY Left 05/14/2013   Procedure: LEFT THUMB CARPAL METACARPAL ARTHROPLASTY;  Surgeon: Tennis Must, MD;  Location: [place];  Service: Orthopedics;  Laterality:  Left;   ICD IMPLANT N/A 04/03/2020   Procedure: ICD IMPLANT;  Surgeon: Constance Haw, MD;  Location: [facility];  Service: Cardiovascular;  Laterality: N/A;   IR ANGIO INTRA EXTRACRAN SEL COM CAROTID INNOMINATE BILAT MOD SED  01/05/2017   IR ANGIO INTRA EXTRACRAN SEL COM CAROTID INNOMINATE BILAT MOD SED  03/19/2019   IR ANGIO INTRA EXTRACRAN SEL COM CAROTID INNOMINATE BILAT MOD SED  06/04/2020   IR ANGIO INTRA EXTRACRAN SEL INTERNAL CAROTID UNI L MOD SED  04/15/2019   IR ANGIO VERTEBRAL SEL VERTEBRAL BILAT MOD SED  01/05/2017   IR ANGIO VERTEBRAL SEL VERTEBRAL BILAT MOD SED  03/19/2019   IR ANGIO VERTEBRAL SEL VERTEBRAL UNI L MOD SED  06/04/2020   IR ANGIOGRAM FOLLOW UP STUDY  04/15/2019   IR RADIOLOGIST EVAL & MGMT  12/30/2016   IR TRANSCATH/EMBOLIZ  04/15/2019   IR US GUIDE VASC ACCESS RIGHT  03/19/2019   IR US GUIDE VASC ACCESS RIGHT  06/04/2020   JOINT REPLACEMENT  05-15-2013   MALONEY DILATION N/A 09/20/2018   Procedure: Venia Minks DILATION;  Surgeon: Daneil Dolin, MD;  [facility];  Service: Endoscopy;  Laterality: N/A;   RADIOLOGY WITH ANESTHESIA N/A 04/15/2019   Procedure: [employer];  Surgeon: Luanne Bras, MD;  Location: [place];  Service: Radiology;  Laterality: N/A;   RIGHT/LEFT HEART CATH AND CORONARY ANGIOGRAPHY N/A 08/19/2019   Procedure: RIGHT/LEFT HEART CATH AND CORONARY ANGIOGRAPHY;  Surgeon: Larey Dresser, MD;  Location: [place] [facility];  Service: Cardiovascular;  Laterality: N/A;   TUBAL LIGATION  1987   VAGINAL HYSTERECTOMY  2009    Past Family History   Family History  Problem Relation Age of Onset   Stroke Mother    Hypertension Mother    Diabetes Mother    Heart attack Mother    Heart attack Father    Diabetes Father    Hypertension Father    CAD Father    Colon polyps Father 21       pre-cancerous    Stroke Father    Dementia Father    Hyperlipidemia Father    Arthritis Father    COPD Father    Heart disease  Father    Breast cancer Maternal Grandmother    Diabetes Maternal Grandmother    Cancer Maternal Grandfather  Tongue and esophageal   Anxiety disorder Daughter    Depression Daughter    Anxiety disorder Daughter    Heart failure Other    Cancer Paternal Grandmother    Colon cancer Neg Hx     Past Social History   Social History   Socioeconomic History   Marital status: Married    Spouse name: Not on file   Number of children: Not on file   Years of education: Not on file   Highest education level: Not on file  Occupational History   Occupation: CNA  Tobacco Use   Smoking status: Light Smoker    Packs/day: 0.00    Years: 15.00    Total pack years: 0.00    Types: Cigarettes   Smokeless tobacco: Never   Tobacco comments:    smokes a cigarette occasionally  Vaping Use   Vaping Use: Never used  Substance and Sexual Activity   Alcohol use: Yes    Comment: occasionally   Drug use: Yes    Types: Marijuana    Comment: former- 2017 last time   Sexual activity: Not Currently    Birth control/protection: Surgical    Comment: hyst  Other Topics Concern   Not on file  Social History Narrative   Lives with husband in [place] in a one story home with a basement.  Has 4 children.  Works as a Quarry manager.  Education: CNA school.    Social Determinants of Health   Financial Resource Strain: Low Risk  (06/11/2021)   Overall Financial Resource Strain (CARDIA)    Difficulty of Paying Living Expenses: Not hard at all  Food Insecurity: No Food Insecurity (06/11/2021)   Hunger Vital Sign    Worried About Running Out of Food in the Last Year: Never true    Ran Out of Food in the Last Year: Never true  Transportation Needs: No Transportation Needs (06/11/2021)   PRAPARE - Hydrologist (Medical): No    Lack of Transportation (Non-Medical): No  Physical Activity: Insufficiently Active (06/11/2021)   Exercise Vital Sign    Days of Exercise per Week: 4 days     Minutes of Exercise per Session: 30 min  Stress: Stress Concern Present (06/11/2021)   [place]    Feeling of Stress : Very much  Social Connections: Socially Isolated (06/11/2021)   Social Connection and Isolation Panel [NHANES]    Frequency of Communication with Friends and Family: Once a week    Frequency of Social Gatherings with Friends and Family: Never    Attends Religious Services: Never    Marine scientist or Organizations: No    Attends Archivist Meetings: Never    Marital Status: Married  Human resources officer Violence: Not At Risk (06/11/2021)   Humiliation, Afraid, Rape, and Kick questionnaire    Fear of Current or Ex-Partner: No    Emotionally Abused: No    Physically Abused: No    Sexually Abused: No    Review of Systems   General: Negative for anorexia, weight loss, fever, chills, fatigue, weakness. ENT: Negative for hoarseness, difficulty swallowing , nasal congestion. CV: Negative for chest pain, angina, palpitations, dyspnea on exertion, peripheral edema.  Respiratory: Negative for dyspnea at rest, dyspnea on exertion, cough, sputum, wheezing.  GI: See history of present illness. GU:  Negative for dysuria, hematuria, urinary incontinence, urinary frequency, nocturnal urination.  Endo: Negative for unusual weight change.  Physical Exam   There were no vitals taken for this visit.   General: Well-nourished, well-developed in no acute distress.  Eyes: No icterus. Mouth: Oropharyngeal mucosa moist and pink , no lesions erythema or exudate. Lungs: Clear to auscultation bilaterally.  Heart: Regular rate and rhythm, no murmurs rubs or gallops.  Abdomen: Bowel sounds are normal, nontender, nondistended, no hepatosplenomegaly or masses,  no abdominal bruits or hernia , no rebound or guarding.  Rectal: ***  Extremities: No lower extremity edema. No clubbing or deformities. Neuro: Alert and  oriented x 4   Skin: Warm and dry, no jaundice.   Psych: Alert and cooperative, normal mood and affect.  Labs   Lab Results  Component Value Date   CREATININE 1.15 (H) 06/23/2022   BUN 11 06/23/2022   NA 138 06/23/2022   K 4.6 06/23/2022   CL 106 06/23/2022   CO2 23 06/23/2022   Lab Results  Component Value Date   WBC 9.9 04/13/2022   HGB 13.6 04/13/2022   HCT 41.0 04/13/2022   MCV 87.8 04/13/2022   PLT 339 04/13/2022   Lab Results  Component Value Date   ALT 19 09/09/2021   AST 20 09/09/2021   ALKPHOS 94 09/09/2021   BILITOT 0.4 09/09/2021   Lab Results  Component Value Date   LIPASE 67 (H) 02/18/2021    Imaging Studies   ECHOCARDIOGRAM COMPLETE  Result Date: 06/13/2022    ECHOCARDIOGRAM REPORT   Patient Name:   Erin [name] Date of Exam: 06/13/2022 Medical Rec #:  [id number]       Height:       60.0 in Accession #:    [id number]      Weight:       129.2 lb Date of Birth:  02/17/1961      BSA:          1.550 m Patient Age:    69 years        BP:           152/88 mmHg Patient Gender: F               HR:           85 bpm. Exam Location:  Outpatient Procedure: 2D Echo, 3D Echo, Cardiac Doppler, Color Doppler and Strain Analysis Indications:    I50.40* Unspecified combined systolic (congestive) and diastolic                 (congestive) heart failure  History:        Patient has prior history of Echocardiogram examinations, most                 recent 03/12/2021. CHF and Cardiomyopathy, Previous Myocardial                 Infarction, Signs/Symptoms:Chest Pain, Syncope, Dyspnea and                 Hypotension; Risk Factors:Hypertension and Dyslipidemia.  Sonographer:    Roseanna Rainbow RDCS Referring Phys: [place]  1. Left ventricular ejection fraction, by estimation, is 30 to 35%. The left ventricle has moderately decreased function. The left ventricle demonstrates regional wall motion abnormalities with basal to mid inferolateral and anterolateral akinesis.  There is mild concentric left ventricular hypertrophy. Left ventricular diastolic parameters are consistent with Grade I diastolic dysfunction (impaired relaxation).  2. Right ventricular systolic function is normal. The right ventricular size is normal. There is normal pulmonary artery systolic  pressure. The estimated right ventricular systolic pressure is [id number] mmHg.  3. The mitral valve is normal in structure. Mild mitral valve regurgitation. No evidence of mitral stenosis.  4. The aortic valve is tricuspid. Aortic valve regurgitation is not visualized. No aortic stenosis is present.  5. The inferior vena cava is normal in size with greater than 50% respiratory variability, suggesting right atrial pressure of 3 mmHg. FINDINGS  Left Ventricle: Left ventricular ejection fraction, by estimation, is 30 to 35%. The left ventricle has moderately decreased function. The left ventricle demonstrates regional wall motion abnormalities. The left ventricular internal cavity size was normal in size. There is mild concentric left ventricular hypertrophy. Left ventricular diastolic parameters are consistent with Grade I diastolic dysfunction (impaired relaxation). Right Ventricle: The right ventricular size is normal. No increase in right ventricular wall thickness. Right ventricular systolic function is normal. There is normal pulmonary artery systolic pressure. The tricuspid regurgitant velocity is 2.52 m/s, and  with an assumed right atrial pressure of 3 mmHg, the estimated right ventricular systolic pressure is [id number] mmHg. Left Atrium: Left atrial size was normal in size. Right Atrium: Right atrial size was normal in size. Pericardium: There is no evidence of pericardial effusion. Mitral Valve: The mitral valve is normal in structure. Mild mitral valve regurgitation. No evidence of mitral valve stenosis. Tricuspid Valve: The tricuspid valve is normal in structure. Tricuspid valve regurgitation is trivial. Aortic Valve: The  aortic valve is tricuspid. Aortic valve regurgitation is not visualized. No aortic stenosis is present. Pulmonic Valve: The pulmonic valve was normal in structure. Pulmonic valve regurgitation is not visualized. Aorta: The aortic root is normal in size and structure. Venous: The inferior vena cava is normal in size with greater than 50% respiratory variability, suggesting right atrial pressure of 3 mmHg. IAS/Shunts: No atrial level shunt detected by color flow Doppler. Additional Comments: A device lead is visualized in the right ventricle.  LEFT VENTRICLE PLAX 2D LVIDd:         4.60 cm     Diastology LVIDs:         4.50 cm     LV e' medial:    3.92 cm/s LV PW:         1.00 cm     LV E/e' medial:  23.4 LV IVS:        0.95 cm     LV e' lateral:   4.57 cm/s LVOT diam:     2.20 cm     LV E/e' lateral: 20.1 LV SV:         67 LV SV Index:   43 LVOT Area:     3.80 cm                             3D Volume EF: LV Volumes (MOD)           3D EF:        34 % LV vol d, MOD A2C: 84.0 ml LV EDV:       123 ml LV vol d, MOD A4C: 83.0 ml LV ESV:       82 ml LV vol s, MOD A2C: 53.5 ml LV SV:        42 ml LV vol s, MOD A4C: 59.1 ml LV SV MOD A2C:     30.5 ml LV SV MOD A4C:     83.0 ml LV SV MOD BP:  27.6 ml RIGHT VENTRICLE             IVC RV S prime:     10.70 cm/s  IVC diam: 1.90 cm TAPSE (M-mode): 1.7 cm LEFT ATRIUM             Index        RIGHT ATRIUM           Index LA diam:        3.20 cm 2.06 cm/m   RA Area:     10.30 cm LA Vol (A2C):   38.1 ml 24.58 ml/m  RA Volume:   19.50 ml  12.58 ml/m LA Vol (A4C):   23.2 ml 14.97 ml/m LA Biplane Vol: 30.2 ml 19.48 ml/m  AORTIC VALVE LVOT Vmax:   90.45 cm/s LVOT Vmean:  61.400 cm/s LVOT VTI:    0.176 m  AORTA Ao Root diam: 3.00 cm Ao Asc diam:  3.30 cm MITRAL VALVE               TRICUSPID VALVE MV Area (PHT): 5.02 cm    TR Peak grad:   25.4 mmHg MV Decel Time: 151 msec    TR Vmax:        252.00 cm/s MV E velocity: 91.70 cm/s MV A velocity: 88.00 cm/s  SHUNTS MV E/A ratio:  1.04         Systemic VTI:  0.18 m                            Systemic Diam: 2.20 cm Dalton McleanMD Electronically signed by Franki Monte Signature Date/Time: 06/13/2022/9:49:22 AM    Final     Assessment       PLAN   ***   Laureen Ochs. Bobby Rumpf, [place], [facility]

## 2022-06-29 ENCOUNTER — Encounter: Payer: Self-pay | Admitting: Gastroenterology

## 2022-06-29 ENCOUNTER — Ambulatory Visit (INDEPENDENT_AMBULATORY_CARE_PROVIDER_SITE_OTHER): Payer: 59 | Admitting: Gastroenterology

## 2022-06-29 ENCOUNTER — Telehealth: Payer: Self-pay | Admitting: *Deleted

## 2022-06-29 VITALS — BP 106/69 | HR 96 | Temp 98.2°F | Ht 60.0 in | Wt 128.4 lb

## 2022-06-29 DIAGNOSIS — K219 Gastro-esophageal reflux disease without esophagitis: Secondary | ICD-10-CM

## 2022-06-29 DIAGNOSIS — K862 Cyst of pancreas: Secondary | ICD-10-CM

## 2022-06-29 DIAGNOSIS — R1013 Epigastric pain: Secondary | ICD-10-CM | POA: Diagnosis not present

## 2022-06-29 DIAGNOSIS — R112 Nausea with vomiting, unspecified: Secondary | ICD-10-CM | POA: Diagnosis not present

## 2022-06-29 DIAGNOSIS — Z91018 Allergy to other foods: Secondary | ICD-10-CM

## 2022-06-29 DIAGNOSIS — R1011 Right upper quadrant pain: Secondary | ICD-10-CM

## 2022-06-29 MED ORDER — ONDANSETRON HCL 4 MG PO TABS: 4.0000 mg | ORAL_TABLET | Freq: Four times a day (QID) | ORAL | 1 refills | Status: DC | PRN

## 2022-06-29 NOTE — Telephone Encounter (Signed)
PA for CT A/p w/contrast via Evicore: Case # [id number] has been updated to pending eviCore review

## 2022-06-29 NOTE — Patient Instructions (Addendum)
Please complete labs today or tomorrow.  CT Abdomen/pelvis to be scheduled. RX for Zofran sent to the pharmacy. Use when needed for nausea, up to four times daily.  It was a pleasure to see you today. I want to create trusting relationships with patients and provide genuine, compassionate, and quality care. I truly value your feedback, so please be on the lookout for a survey regarding your visit with me today. I appreciate your time in completing this!

## 2022-07-01 LAB — COMPREHENSIVE METABOLIC PANEL
ALT: 17 IU/L (ref 0–32)
AST: 18 IU/L (ref 0–40)
Albumin/Globulin Ratio: 1.7 (ref 1.2–2.2)
Albumin: 4.5 g/dL (ref 3.8–4.9)
Alkaline Phosphatase: 90 IU/L (ref 44–121)
BUN/Creatinine Ratio: 15 (ref 12–28)
BUN: 25 mg/dL (ref 8–27)
Bilirubin Total: 0.7 mg/dL (ref 0.0–1.2)
CO2: 19 mmol/L — ABNORMAL LOW (ref 20–29)
Calcium: 10.4 mg/dL — ABNORMAL HIGH (ref 8.7–10.3)
Chloride: 98 mmol/L (ref 96–106)
Creatinine, Ser: 1.68 mg/dL — ABNORMAL HIGH (ref 0.57–1.00)
Globulin, Total: 2.6 g/dL (ref 1.5–4.5)
Glucose: 97 mg/dL (ref 70–99)
Potassium: 5.4 mmol/L — ABNORMAL HIGH (ref 3.5–5.2)
Sodium: 134 mmol/L (ref 134–144)
Total Protein: 7.1 g/dL (ref 6.0–8.5)
eGFR: 35 mL/min/{1.73_m2} — ABNORMAL LOW (ref >59)

## 2022-07-01 LAB — CBC WITH DIFFERENTIAL/PLATELET
Basophils Absolute: 0.1 10*3/uL (ref 0.0–0.2)
Basos: 1 %
EOS (ABSOLUTE): 0.2 10*3/uL (ref 0.0–0.4)
Eos: 3 %
Hematocrit: 43.4 % (ref 34.0–46.6)
Hemoglobin: 14.3 g/dL (ref 11.1–15.9)
Immature Grans (Abs): 0.1 10*3/uL (ref 0.0–0.1)
Immature Granulocytes: 1 %
Lymphocytes Absolute: 2.5 10*3/uL (ref 0.7–3.1)
Lymphs: 32 %
MCH: 28.1 pg (ref 26.6–33.0)
MCHC: 32.9 g/dL (ref 31.5–35.7)
MCV: 85 fL (ref 79–97)
Monocytes Absolute: 0.8 10*3/uL (ref 0.1–0.9)
Monocytes: 10 %
Neutrophils Absolute: 4.3 10*3/uL (ref 1.4–7.0)
Neutrophils: 53 %
Platelets: 332 10*3/uL (ref 150–450)
RBC: 5.09 x10E6/uL (ref 3.77–5.28)
RDW: 14 % (ref 11.7–15.4)
WBC: 8 10*3/uL (ref 3.4–10.8)

## 2022-07-01 LAB — LIPASE: Lipase: 70 U/L (ref 14–72)

## 2022-07-04 ENCOUNTER — Other Ambulatory Visit (HOSPITAL_COMMUNITY)
Admission: RE | Admit: 2022-07-04 | Discharge: 2022-07-04 | Disposition: A | Discharge status: Home / Self Care | Payer: 59 | Source: Ambulatory Visit | Attending: Gastroenterology | Admitting: Gastroenterology

## 2022-07-04 ENCOUNTER — Inpatient Hospital Stay (HOSPITAL_COMMUNITY)
Admission: RE | Admit: 2022-07-04 | Discharge: 2022-07-04 | Disposition: A | Discharge status: Home / Self Care | Payer: 59 | Source: Ambulatory Visit

## 2022-07-04 ENCOUNTER — Other Ambulatory Visit: Payer: Self-pay

## 2022-07-04 DIAGNOSIS — E875 Hyperkalemia: Secondary | ICD-10-CM

## 2022-07-04 LAB — BASIC METABOLIC PANEL
Anion gap: 9 (ref 5–15)
BUN: 12 mg/dL (ref 6–20)
CO2: 24 mmol/L (ref 22–32)
Calcium: 9.3 mg/dL (ref 8.9–10.3)
Chloride: 103 mmol/L (ref 98–111)
Creatinine, Ser: 1.06 mg/dL — ABNORMAL HIGH (ref 0.44–1.00)
GFR, Estimated: >60 mL/min (ref >60)
Glucose, Bld: 101 mg/dL — ABNORMAL HIGH (ref 70–99)
Potassium: 4.8 mmol/L (ref 3.5–5.1)
Sodium: 136 mmol/L (ref 135–145)

## 2022-07-04 NOTE — Progress Notes (Incomplete)
***In Progress***    Advanced Heart Failure Clinic Note   PCP: Dr. [name] Kitchen HF Cardiology: [name]  HPI:   62 y.o. with history of CAD, ischemic cardiomyopathy, and PAD was referred by [name] for evaluation of CHF. She had NSTEMI in 03/2016 with PCI to prox/mid LCx and RCA. Subsequently, has developed ischemic cardiomyopathy. Echo in 05/2019 showed EF 30-35%. 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    RHC/LHC done with exertional chest heaviness and dyspnea in 08/2019 showed normal filling pressures, preserved cardiac output, and nonobstructive mild CAD.  ABIs were normal in 08/2019.    In 12/2019, she had syncope thought to be related to orthostasis from low BP in the setting of cardiac medication titration. Entresto was decreased.    She was in the ER in 03/2020 with chest pain.  Troponin and COVID-19 negative. Echo in 11/21 showed EF 30-35%, normal RV.    She was hospitalized in 10/2020 with left-sided weakness/numbness.  No evidence for acute CVA, ?TIA.     Echo in 03/2021 showed EF 30-35% with mild LV dilation, normal RV, mild-moderate MR, IVC normal.    Echo was 06/2022 showed EF remains 30-35% with normal RV.    At last visit with MD on 06/13/22, she denied dyspnea walking on flat ground or walking up a flight of stairs. Able to do ADLs. Denied chest pain.  Her granddaughter was sick in the hospital. Her BP was elevated, but she has not taken her meds. Weight was up 4 lbs.   Today he returns to HF clinic for pharmacist medication titration.   Overall feeling ***. Dizziness, lightheadedness, fatigue:  Chest pain or palpitations:  How is your breathing?: *** SOB: Able to complete all ADLs. Activity level ***  Weight at home pounds. Takes furosemide/torsemide/bumex *** mg *** daily.  LEE PND/Orthopnea  Appetite *** Low-salt diet:   Physical Exam Cost/affordability of meds   HF Medications: Carvedilol 6.25 mg  BID Entresto 24/26 mg BID Spironolactone 25 mg daily Farxiga 10 mg daily Furosemide 20 mg daily + KCl 20 meq daily  Has the patient been experiencing any side effects to the medications prescribed?  {YES NO:22349}  Does the patient have any problems obtaining medications due to transportation or finances?   {YES NO:22349}  Understanding of regimen: {excellent/good/fair/poor:19665} Understanding of indications: {excellent/good/fair/poor:19665} Potential of compliance: {excellent/good/fair/poor:19665} Patient understands to avoid NSAIDs. Patient understands to avoid decongestants.    Pertinent Lab Values: Serum creatinine 1.68, BUN 25, Potassium 5.4, Sodium 134  Vital Signs: Weight: *** (last clinic weight: 131) Blood pressure: ***  Heart rate: ***   Assessment/Plan: 1. CAD: S/p NSTEMI in 03/2016 with DEX to LCx and DES x 2 to RCA. Cardiolite in 02/2019 with EF <30%, inferior/inferolateral infarct with peri-infarct ischemia. LHC in 08/2019 showed nonobstructive mild CAD. No chest pain.  - Continue atorvastatin and Zetia. Check lipids today.  - Continue ASA 81 and Plavix 75 mg daily for now.  She will continue Plavix as long as Dr. Estanislado Pandy feels it is needed post her cranial circulation intervention.   2. Chronic systolic CHF: Ischemic cardiomyopathy. Most recent echo showed stable EF 30-35%.  RHC in 08/2019 with normal filling pressures and preserved cardiac output. NYHA class II symptoms. She is not volume overloaded by exam or Optivol.  [name] - BMET/BNP today and BMET in 10 days.  - With starting Entresto, decrease Lasix to 20 mg daily.   - Continue Coreg 6.25 mg  bid.      - Continue spironolactone 25 mg daily.   - Continue dapagliflozin 10 mg daily.    3. Carotid stenosis: Repeat carotids in 04/2023.   4. Smoking: She wants to quit, had bad dreams with Chantix.  - I will start her on Wellbutrin.   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  - Continue  Plavix per Dr. Estanislado Pandy.    Followup 3 wks with HF pharmacist for med titration, followup 3 months with APP.    Follow up ***   [name], PharmD, BCPS, BCCP, CPP Heart Failure Clinic Pharmacist [phone]

## 2022-07-04 NOTE — Progress Notes (Signed)
Pt was made aware and verbalized understanding.  

## 2022-07-04 NOTE — Telephone Encounter (Signed)
Resubmitted PA to [facility] for CT A/P. Faxed additional information on pt. Case # [id number]

## 2022-07-10 ENCOUNTER — Encounter: Payer: Self-pay | Admitting: Orthopaedic Surgery

## 2022-07-10 NOTE — Progress Notes (Incomplete)
Advanced Heart Failure Clinic Note    PCP: Dr. [name] Kitchen HF Cardiology: [name]   HPI:    62 y.o. with history of CAD, ischemic cardiomyopathy, and PAD was referred by [name] for evaluation of CHF. She had NSTEMI in 03/2016 with PCI to prox/mid LCx and RCA. Subsequently, has developed ischemic cardiomyopathy. Echo in 05/2019 showed EF 30-35%. 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    RHC/LHC done with exertional chest heaviness and dyspnea in 08/2019 showed normal filling pressures, preserved cardiac output, and nonobstructive mild CAD.  ABIs were normal in 08/2019.    In 12/2019, she had syncope thought to be related to orthostasis from low BP in the setting of cardiac medication titration. Entresto was decreased.    She was in the ER in 03/2020 with chest pain.  Troponin and COVID-19 negative. Echo in 11/21 showed EF 30-35%, normal RV.    She was hospitalized in 10/2020 with left-sided weakness/numbness. No evidence for acute CVA, ?TIA.     Echo in 03/2021 showed EF 30-35% with mild LV dilation, normal RV, mild-moderate MR, IVC normal.    Echo was 06/2022 showed EF remains 30-35% with normal RV.    At last visit with MD on 06/13/22, she denied dyspnea walking on flat ground or walking up a flight of stairs. Able to do ADLs. Denied chest pain.  Her granddaughter was sick in the hospital. Her BP was elevated, but she had not taken her meds. Weight was up 4 lbs. Losartan was transitioned to [facility] and furosemide was decreased.   Today he returns to HF clinic for pharmacist medication titration.    Overall feeling ***. Dizziness, lightheadedness, fatigue:  Chest pain or palpitations:   How is your breathing?: *** SOB: Able to complete all ADLs. Activity level ***   Weight at home pounds. Takes furosemide/torsemide/bumex *** mg *** daily.  LEE PND/Orthopnea   Appetite *** Low-salt diet:    Physical  Exam Cost/affordability of meds    HF Medications: Carvedilol 6.25 mg BID Entresto 24/26 mg BID Spironolactone 25 mg daily Farxiga 10 mg daily Furosemide 20 mg daily + KCl 20 meq daily   Has the patient been experiencing any side effects to the medications prescribed?  {YES NO:22349}   Does the patient have any problems obtaining medications due to transportation or finances?   {YES NO:22349}   Understanding of regimen: {excellent/good/fair/poor:19665} Understanding of indications: {excellent/good/fair/poor:19665} Potential of compliance: {excellent/good/fair/poor:19665} Patient understands to avoid NSAIDs. Patient understands to avoid decongestants.     Pertinent Lab Values (07/04/22): Serum creatinine 1.06, BUN 24, Potassium 4.8, Sodium 136   Vital Signs: Weight: *** (last clinic weight: 131) Blood pressure: ***  Heart rate: ***    Assessment/Plan: 1. CAD: S/p NSTEMI in 03/2016 with DEX to LCx and DES x 2 to RCA. Cardiolite in 02/2019 with EF <30%, inferior/inferolateral infarct with peri-infarct ischemia. LHC in 08/2019 showed nonobstructive mild CAD. No chest pain.  - Continue atorvastatin and Zetia. Check lipids today.  - Continue ASA 81 and Plavix 75 mg daily for now.  She will continue Plavix as long as Dr. Estanislado Pandy feels it is needed post her cranial circulation intervention.    2. Chronic systolic CHF: Ischemic cardiomyopathy. Most recent echo showed stable EF 30-35%.  RHC in 08/2019 with normal filling pressures and preserved cardiac output. NYHA class II symptoms. She is not volume overloaded by exam or Optivol.  [name] - BMET/BNP  today and BMET in 10 days.  - With starting Entresto, decrease Lasix to 20 mg daily.   - Continue Coreg 6.25 mg bid.      - Continue spironolactone 25 mg daily.   - Continue dapagliflozin 10 mg daily.     3. Carotid stenosis: Repeat carotids in 04/2023.    4. Smoking: She wants to quit, had bad dreams with Chantix.  - I will start  her on Wellbutrin.    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  - Continue Plavix per Dr. Estanislado Pandy.    Followup 3 wks with HF pharmacist for med titration, followup 3 months with APP.     Follow up ***     [name], PharmD, BCPS, BCCP, CPP Heart Failure Clinic Pharmacist [phone]

## 2022-07-11 ENCOUNTER — Ambulatory Visit (HOSPITAL_COMMUNITY)
Admission: RE | Admit: 2022-07-11 | Discharge: 2022-07-11 | Disposition: A | Discharge status: Home / Self Care | Payer: 59 | Source: Ambulatory Visit | Attending: Cardiology | Admitting: Cardiology

## 2022-07-11 ENCOUNTER — Other Ambulatory Visit (HOSPITAL_COMMUNITY): Payer: Self-pay | Admitting: Cardiology

## 2022-07-11 ENCOUNTER — Telehealth (HOSPITAL_COMMUNITY): Payer: Self-pay | Admitting: Pharmacist

## 2022-07-11 VITALS — BP 114/84 | HR 97

## 2022-07-11 DIAGNOSIS — I252 Old myocardial infarction: Secondary | ICD-10-CM | POA: Diagnosis not present

## 2022-07-11 DIAGNOSIS — R42 Dizziness and giddiness: Secondary | ICD-10-CM | POA: Insufficient documentation

## 2022-07-11 DIAGNOSIS — Z7902 Long term (current) use of antithrombotics/antiplatelets: Secondary | ICD-10-CM | POA: Diagnosis not present

## 2022-07-11 DIAGNOSIS — I5022 Chronic systolic (congestive) heart failure: Secondary | ICD-10-CM | POA: Insufficient documentation

## 2022-07-11 DIAGNOSIS — Z79899 Other long term (current) drug therapy: Secondary | ICD-10-CM | POA: Diagnosis not present

## 2022-07-11 DIAGNOSIS — I255 Ischemic cardiomyopathy: Secondary | ICD-10-CM | POA: Insufficient documentation

## 2022-07-11 DIAGNOSIS — F17210 Nicotine dependence, cigarettes, uncomplicated: Secondary | ICD-10-CM | POA: Diagnosis not present

## 2022-07-11 DIAGNOSIS — I959 Hypotension, unspecified: Secondary | ICD-10-CM | POA: Insufficient documentation

## 2022-07-11 DIAGNOSIS — I6529 Occlusion and stenosis of unspecified carotid artery: Secondary | ICD-10-CM | POA: Diagnosis not present

## 2022-07-11 DIAGNOSIS — I2510 Atherosclerotic heart disease of native coronary artery without angina pectoris: Secondary | ICD-10-CM | POA: Diagnosis not present

## 2022-07-11 DIAGNOSIS — I5023 Acute on chronic systolic (congestive) heart failure: Secondary | ICD-10-CM

## 2022-07-11 LAB — BASIC METABOLIC PANEL
Anion gap: 8 (ref 5–15)
BUN: 7 mg/dL (ref 6–20)
CO2: 25 mmol/L (ref 22–32)
Calcium: 9.1 mg/dL (ref 8.9–10.3)
Chloride: 103 mmol/L (ref 98–111)
Creatinine, Ser: 1.09 mg/dL — ABNORMAL HIGH (ref 0.44–1.00)
GFR, Estimated: 58 mL/min — ABNORMAL LOW (ref >60)
Glucose, Bld: 83 mg/dL (ref 70–99)
Potassium: 3.9 mmol/L (ref 3.5–5.1)
Sodium: 136 mmol/L (ref 135–145)

## 2022-07-11 MED ORDER — POTASSIUM CHLORIDE CRYS ER 20 MEQ PO TBCR: 20.0000 meq | EXTENDED_RELEASE_TABLET | Freq: Two times a day (BID) | ORAL | 6 refills | Status: DC

## 2022-07-11 MED ORDER — LOSARTAN POTASSIUM 25 MG PO TABS: 25.0000 mg | ORAL_TABLET | Freq: Every day | ORAL | 3 refills | Status: DC

## 2022-07-11 NOTE — Progress Notes (Signed)
Advanced Heart Failure Clinic Note    PCP: Dr. [name] Kitchen HF Cardiology: [name]   HPI:    62 y.o. with history of CAD, ischemic cardiomyopathy, and PAD was referred by [name] for evaluation of CHF. She had NSTEMI in 03/2016 with PCI to prox/mid LCx and RCA. Subsequently, has developed ischemic cardiomyopathy. Echo in 05/2019 showed EF 30-35%. 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    RHC/LHC done with exertional chest heaviness and dyspnea in 08/2019 showed normal filling pressures, preserved cardiac output, and nonobstructive mild CAD.  ABIs were normal in 08/2019.    In 12/2019, she had syncope thought to be related to orthostasis from low BP in the setting of cardiac medication titration. Entresto was decreased.    She was in the ER in 03/2020 with chest pain.  Troponin and COVID-19 negative. Echo in 03/2020 showed EF 30-35%, normal RV.    She was hospitalized in 10/2020 with left-sided weakness/numbness. No evidence for acute CVA, ?TIA.     Echo in 03/2021 showed EF 30-35% with mild LV dilation, normal RV, mild-moderate MR, IVC normal.    Echo was 06/2022 showed EF remains 30-35% with normal RV.    At last visit with MD on 06/13/22, she denied dyspnea walking on flat ground or walking up a flight of stairs. Able to do ADLs. Denied chest pain. Her granddaughter was sick in the hospital. Her BP was elevated, but she had not taken her meds. Weight was up 4 lbs. Losartan was transitioned to [facility] and furosemide was decreased.   Today she returns to HF clinic for pharmacist medication titration. Overall feeling okay, however she reports a marked increase in dizziness occurring 3-4 times per day. It occurs both when rising from sitting and even with standing and movement. She experienced presyncope yesterday. Dizziness limits her ability to complete ADLs. Her BP at home is usually 90s/50s mmHg. Experiences fatigue only when taking  the stairs. Denies chest pain or palpitations. Experiencing palpitations when laying on her right side at night. Denies any SOB. Remains active, walking with her grandchildren ~20 mins every day. Weight at home was 130.7 lbs this AM. Takes furosemide 20 mg daily. No LEE, PND, or orthopnea. Endorses poor appetite secondary to her GI symptoms. This is unchanged from prior. Adheres to low-salt diet.   HF Medications: Entresto 24/26 mg BID Spironolactone 25 mg daily Farxiga 10 mg daily Furosemide 20 mg daily + KCl 20 meq twice daily   Has the patient been experiencing any side effects to the medications prescribed?  Yes. She experiences significantly dizziness multiple times daily since changing losartan to [facility]. She experienced presyncope yesterday.   Does the patient have any problems obtaining medications due to transportation or finances?  No   Understanding of regimen: excellent Understanding of indications: excellent Potential of compliance: She has not taken any medications today. She also stopped taking carvedilol because she reports being instructed to do so by her provider. She also is not taking potassium as prescribed. After hyperkalemia on 06/30/22, she reduced her potassium supplementation from 40 meq twice daily to 20 meq twice daily.   Patient understands to avoid NSAIDs. Patient understands to avoid decongestants.     Pertinent Lab Values (07/04/22): Serum creatinine 1.06, BUN 24, Potassium 4.8, Sodium 136   Vital Signs: Weight: 131.2 lbs (last clinic weight: 131) Blood pressure: 114/84 mmHg  Heart rate: 97 bpm    Assessment/Plan: 1. CAD:  S/p NSTEMI in 03/2016 with DEX to LCx and DES x 2 to RCA. Cardiolite in 02/2019 with EF <30%, inferior/inferolateral infarct with peri-infarct ischemia. LHC in 08/2019 showed nonobstructive mild CAD. No chest pain.  - Continue atorvastatin and Zetia. - Continue ASA 81 and Plavix 75 mg daily for now. She will continue Plavix as long as Dr.  Estanislado Pandy feels it is needed post her cranial circulation intervention.    2. Chronic systolic CHF: Ischemic cardiomyopathy. Most recent echo showed stable EF 30-35%.  RHC in 08/2019 with normal filling pressures and preserved cardiac output. NYHA class II symptoms. She is not volume overloaded by exam today. Her dizziness occurs while standing for extended periods of time. Not likely due to hypovolemia alone. - Continue Lasix 20 mg daily. Patient is not taking potassium as prescribed. Reports taking 2 tablets twice daily prior to hyperkalemia on 2/22, then reducing to 1 tablet twice daily afterwards. Will continue with current potassium dosing for now, however may require adjustment pending labs today. - Stop Entresto given symptomatic hypotension. Her BP at home is 90s/50s and in clinic today was 114/84 without taking any medications. She experiences dizziness and lightheadedness 3-4 times daily that limits ability to perform ADLs.  - Start losartan 25 mg daily on 07/12/22. This will be >24 hours since last Entresto dose. She has tolerated losartan 25 mg daily in the past. BMET/BNP today. - Consider restarting carvedilol at next visit. Not restarted today given symptomatic hypotension. - Continue spironolactone 25 mg daily.   - Continue dapagliflozin 10 mg daily.     3. Carotid stenosis: Repeat carotids in 04/2023.    4. Smoking: She wants to quit, had bad dreams with Chantix.  - Continue bupropion. She reports reducing cigarette use to only 1 pack per month. She only smokes when stressed.   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  - Continue Plavix per Dr. Estanislado Pandy.    Followup in 2 months with CHF clinic.   [name], PharmD, BCPS, BCCP, CPP Heart Failure Clinic Pharmacist [phone]

## 2022-07-11 NOTE — Patient Instructions (Signed)
It was a pleasure seeing you today!  MEDICATIONS: -We are changing your medications today -Stop Entresto -Start losartan 25 mg once daily the evening of 07/12/22 -Call if you have questions about your medications.  LABS: -We will call you if your labs need attention.  NEXT APPOINTMENT: Return to clinic in April for follow-up.  OTHER: -If you have an increase in shortness of breath or swelling after stopping Entresto, please reach out to the clinic.  In general, to take care of your heart failure: -Limit your fluid intake to 2 Liters (half-gallon) per day.   -Limit your salt intake to ideally 2-3 grams (2000-3000 mg) per day. -Weigh yourself daily and record, and bring that "weight diary" to your next appointment.  (Weight gain of 2-3 pounds in 1 day typically means fluid weight.) -The medications for your heart are to help your heart and help you live longer.   -Please contact us before stopping any of your heart medications.  Call the clinic at [phone] with questions or to reschedule future appointments.

## 2022-07-11 NOTE — Telephone Encounter (Addendum)
Spoke with patient via telephone. Patient has been taking potassium 20 meq twice daily rather than daily as instructed after hyperkalemia event. She reports taking 40 meq twice daily prior to her hyperkalemia event. She was instructed to continue current potassium dosing of 1 tablet twice daily. Med list updated.

## 2022-07-12 ENCOUNTER — Telehealth: Payer: Self-pay | Admitting: *Deleted

## 2022-07-12 NOTE — Telephone Encounter (Signed)
CT for pt has been denied. I placed the denial on your desk and the number to do a peer to peer. Please advise. Thank you

## 2022-07-12 NOTE — Telephone Encounter (Signed)
Requested peer to peer from [place] because case was denied.  Submitted PA to [employer] for secondary insurance: Pending Tracking Number: [id number]

## 2022-07-13 NOTE — Addendum Note (Signed)
Addended by: Cheron Every on: 07/13/2022 01:54 PM   Modules accepted: Orders

## 2022-07-13 NOTE — Telephone Encounter (Signed)
Peer to peer review completed with Dr. Eber Jones.  She is approved for the following. We will need to change original orders please. 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  Approval number [id number] 01/09/2023

## 2022-07-13 NOTE — Telephone Encounter (Signed)
[facility] had cancellation on 3/31m arrival 1:15pm, npo 4 hrs priors.  Called pt and she is aware of appt details. Nothing further needed

## 2022-07-14 NOTE — Telephone Encounter (Signed)
Yes, I'm trying to get approved with [employer], it was approved before but it had expired.

## 2022-07-15 ENCOUNTER — Telehealth: Payer: Self-pay

## 2022-07-15 NOTE — Telephone Encounter (Signed)
LMOVM for patient to send missed ICM transmission. 

## 2022-07-18 NOTE — Progress Notes (Signed)
No ICM remote transmission received for 07/11/2022 and next ICM transmission scheduled for 08/01/2022.   

## 2022-07-21 ENCOUNTER — Encounter: Payer: Self-pay | Admitting: *Deleted

## 2022-07-21 NOTE — Telephone Encounter (Signed)
Says it's still in review

## 2022-07-22 ENCOUNTER — Encounter (HOSPITAL_COMMUNITY): Payer: Self-pay

## 2022-07-22 ENCOUNTER — Ambulatory Visit (HOSPITAL_COMMUNITY): Payer: Medicaid Other

## 2022-07-25 NOTE — Telephone Encounter (Signed)
Pt states she does not have [employer].

## 2022-07-27 ENCOUNTER — Other Ambulatory Visit (HOSPITAL_COMMUNITY): Payer: Self-pay

## 2022-07-27 ENCOUNTER — Telehealth: Payer: Self-pay | Admitting: *Deleted

## 2022-07-27 ENCOUNTER — Other Ambulatory Visit: Payer: Self-pay | Admitting: Physician Assistant

## 2022-07-27 ENCOUNTER — Encounter: Payer: Self-pay | Admitting: Student

## 2022-07-27 ENCOUNTER — Ambulatory Visit: Payer: Medicaid Other | Attending: Cardiology | Admitting: Student

## 2022-07-27 VITALS — BP 111/76 | HR 102

## 2022-07-27 DIAGNOSIS — I255 Ischemic cardiomyopathy: Secondary | ICD-10-CM | POA: Diagnosis not present

## 2022-07-27 DIAGNOSIS — E781 Pure hyperglyceridemia: Secondary | ICD-10-CM

## 2022-07-27 MED ORDER — TRAMADOL HCL 50 MG PO TABS: 50.0000 mg | ORAL_TABLET | Freq: Three times a day (TID) | ORAL | 2 refills | Status: DC | PRN

## 2022-07-27 MED ORDER — ATORVASTATIN CALCIUM 40 MG PO TABS: 40.0000 mg | ORAL_TABLET | Freq: Every day | ORAL | 3 refills | Status: DC

## 2022-07-27 MED ORDER — FENOFIBRATE 145 MG PO TABS: 145.0000 mg | ORAL_TABLET | Freq: Every day | ORAL | 3 refills | Status: DC

## 2022-07-27 MED ORDER — METOPROLOL SUCCINATE ER 25 MG PO TB24: 12.5000 mg | ORAL_TABLET | Freq: Every day | ORAL | 3 refills | Status: DC

## 2022-07-27 NOTE — Assessment & Plan Note (Signed)
Assessment/ plan:  In office BP 111/76 mmHg heart rate 102, home BP ~96/54 heart rate 74-90  Current medications: Farxiga 10 mg daily , losartan 25 mg daily, spironolactone 25 mg daily - tolerates them well Intolerant to Entresto due to hypotension  Given high HR will initiate metoprolol  Patient on bupropion for smoking cession which may increased metoprolol level so will start metoprolol XL low dose 12.5 mg daily Patient to follow up in 4 weeks with PharmD  Patient to bring  home BP readings and home BP cuff for validation at the next OV

## 2022-07-27 NOTE — Telephone Encounter (Signed)
Sent in tramadol

## 2022-07-27 NOTE — Patient Instructions (Addendum)
Changes made by your pharmacist Cammy Copa, PharmD at today's visit:    Instructions/Changes  (what do you need to do) Your Notes  (what you did and when you did it)  Start metoprolol 12.5 mg daily    2.  Continue taking losartan 25 mg daily, spironolactone 25 mg daily and Farxiga 10 mg daily    3. Will apply for prior authorization for Repatha     Bring all of your meds, your BP cuff and your record of home blood pressures to your next appointment.    HOW TO TAKE YOUR BLOOD PRESSURE AT HOME  Rest 5 minutes before taking your blood pressure.  Don't smoke or drink caffeinated beverages for at least 30 minutes before. Take your blood pressure before (not after) you eat. Sit comfortably with your back supported and both feet on the floor (don't cross your legs). Elevate your arm to heart level on a table or a desk. Use the proper sized cuff. It should fit smoothly and snugly around your bare upper arm. There should be enough room to slip a fingertip under the cuff. The bottom edge of the cuff should be 1 inch above the crease of the elbow. Ideally, take 3 measurements at one sitting and record the average.  Important lifestyle changes to control high blood pressure  Intervention  Effect on the BP  Lose extra pounds and watch your waistline Weight loss is one of the most effective lifestyle changes for controlling blood pressure. If you're overweight or obese, losing even a small amount of weight can help reduce blood pressure. Blood pressure might go down by about 1 millimeter of mercury (mm Hg) with each kilogram (about 2.2 pounds) of weight lost.  Exercise regularly As a general goal, aim for at least 30 minutes of moderate physical activity every day. Regular physical activity can lower high blood pressure by about 5 to 8 mm Hg.  Eat a healthy diet Eating a diet rich in whole grains, fruits, vegetables, and low-fat dairy products and low in saturated fat and cholesterol. A healthy  diet can lower high blood pressure by up to 11 mm Hg.  Reduce salt (sodium) in your diet Even a small reduction of sodium in the diet can improve heart health and reduce high blood pressure by about 5 to 6 mm Hg.  Limit alcohol One drink equals 12 ounces of beer, 5 ounces of wine, or 1.5 ounces of 80-proof liquor.  Limiting alcohol to less than one drink a day for women or two drinks a day for men can help lower blood pressure by about 4 mm Hg.   If you have any questions or concerns please use My Chart to send questions or call the office at [phone]

## 2022-07-27 NOTE — Telephone Encounter (Signed)
Yes ma'am, you did but the [place] was listed as her primary and she says she no longer has that insurance.

## 2022-07-27 NOTE — Telephone Encounter (Signed)
I did peer to peer review already 07/13/22. W

## 2022-07-27 NOTE — Assessment & Plan Note (Signed)
Assessment:  LDL goal: < 70 mg/dl last LDLc 78 mg/dl ( 06/2022) TG 246 gaol <150 mg/dl  Tolerates Zetia and high intensity statins  and [name] well without any side effects  Given close to goal LDLc and elevated TG will consider addition of fenofibrate To improve tolerability with statin will reduce dose of atorvastatin from 80 mg to 40 mg   Plan: Continue taking current medications Ezetimibe 10 mg daily ,[name] 2 gm twice daily  Reduce the dose of Lipitor from 80 mg 40 mg daily and start taking Tricor 145 mg daily LFT and fastin lipid labs due in 3 months of starting Tricor  In future may consider PCSK9i if LDLc remains elevated

## 2022-07-27 NOTE — Telephone Encounter (Signed)
Pt had a secondary insurance that had to have a PA for CT. Pt said CT was cancelled because PA was not approved in time. She states that she only has one insurance and that is the  [employer] [place]. She says she does not have the [employer] because it was too expensive. Received call from [facility] and it is now in peer to peer. Can call [phone], tracking # [id number]

## 2022-07-27 NOTE — Progress Notes (Signed)
.Patient ID: Erin [name]                 DOB: Mar 03, 1961                    MRN: [id number]     HPI: Erin [name] is a 63 y.o. female patient referred to lipid clinic by [name]. PMH is significant for PAD,CAD, NSTEMI in 03/2016 with PCI to prox/mid LCx and RCA. Subsequently, has developed ischemic cardiomyopathy. Echo in 05/2019 showed EF 30-35%. 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   Patient presented today for lipid clinic. Patient's 39 years old grand daughter is in hospital on life support so she is very stressed. She smokes only 2-3 cig every other day due to this stressful situation. Reports she tolerates all her HF and Lipitor well without s/e. She has been eating clean, does not eat lot of processed food and watches her salt intake. In the past she was hospitalized due to too low BP. Her home BP ~96/54 heart rate 74-80. Dizziness is not that frequent but still get dizzy once in while.    Current Medications: Lipitor 80 mg daily ,ezetimibe 10 mg daily , [name] 2 gm twice daily  Intolerances: none  Risk Factors: CAD, PAD, hx of NSTEMI LDL goal: <70  Diet: low salt , low fat -whole food    Exercise: walks 2 miles every other day    Social History:  alcohol: occasional  Smoking - 2-3 cig every other day - on bupropion   Labs: Lipid Panel     Component Value Date/Time   CHOL 172 06/13/2022 1004   TRIG 249 (H) 06/13/2022 1004   HDL 44 06/13/2022 1004   CHOLHDL 3.9 06/13/2022 1004   VLDL 50 (H) 06/13/2022 1004   [place] 78 06/13/2022 1004    Past Medical History:  Diagnosis Date   AICD (automatic cardioverter/defibrillator) present    Allergic reaction to alpha-gal    Allergy 03/05/2022   Anemia    Anxiety state 03/25/2016   Arthritis    Basal cell carcinoma of forehead    Brain aneurysm    CHF (congestive heart failure) ([place])    Coronary artery disease    a. 03/11/16 PCI with DES-->Prox/Mid Cx;  b. 03/14/16 PCI  with DES x2-->RCA, EF 30-35%.   Depression 06/09/2010   Encounter for general adult medical examination with abnormal findings 05/04/2022   Essential hypertension    GERD (gastroesophageal reflux disease)    HFrEF (heart failure with reduced ejection fraction) ([place])    a. 10/2016 Echo: EF 35-40%, Gr1 DD, mild focal basal septal hypertrophy, basal inflat, mid inflat, basal antlat AK. Mid infept/inf/antlat, apical lateral sev HK. Mod MR. mildly reduced RV fxn. Mild TR.   History of pneumonia    Hyperlipidemia    IBS (irritable bowel syndrome)    Ischemic cardiomyopathy    a. 10/2016 Echo: EF 35-40%, Gr1 DD.   Mitral regurgitation    Neuromuscular disorder (HCC)    NSTEMI (non-ST elevated myocardial infarction) ([place]) 03/10/2016   Pneumonia 03/2016   Shingles    Squamous cell cancer of skin of nose    Thrombocytosis 03/26/2016   Tobacco abuse    Trichimoniasis    Wears dentures    Wears glasses     Current Outpatient Medications on File Prior to Visit  Medication Sig Dispense Refill   aspirin EC 81 MG tablet Take 81 mg by mouth every  evening.      buPROPion (WELLBUTRIN SR) 150 MG 12 hr tablet Take 1 Tab daily for 3 days, then 2 Tab Daily (Patient taking differently: 150 mg daily. Take 1 Tab daily for 3 days, then 2 Tab Daily) 180 tablet 3   clopidogrel (PLAVIX) 75 MG tablet Take 1 tablet by mouth once daily 30 tablet 11   cyproheptadine (PERIACTIN) 4 MG tablet Take 8 mg by mouth 2 (two) times daily.     dapagliflozin propanediol (FARXIGA) 10 MG TABS tablet Take 10 mg by mouth daily.     EPINEPHrine (EPIPEN 2-PAK) 0.3 mg/0.3 mL IJ SOAJ injection Inject 0.3 mg into the muscle as needed for anaphylaxis. (Patient not taking: Reported on 07/11/2022) 2 each 2   ezetimibe (ZETIA) 10 MG tablet Take 1 tablet by mouth once daily 30 tablet 11   furosemide (LASIX) 20 MG tablet Take 1 tablet (20 mg total) by mouth daily. 90 tablet 3   gabapentin (NEURONTIN) 300 MG capsule Take 1 capsule (300 mg total)  by mouth at bedtime. 30 capsule 2   icosapent Ethyl ([name]) 1 g capsule Take 2 capsules (2 g total) by mouth 2 (two) times daily. 120 capsule 11   loperamide (IMODIUM) 2 MG capsule Take 2 mg by mouth 4 (four) times daily as needed for diarrhea or loose stools (ibs).     losartan (COZAAR) 25 MG tablet Take 1 tablet (25 mg total) by mouth daily. 90 tablet 3   Multiple Vitamins-Minerals (MULTIVITAMIN WITH MINERALS) tablet Take 1 tablet by mouth daily.     nitroGLYCERIN (NITROSTAT) 0.4 MG SL tablet Place 1 tablet (0.4 mg total) under the tongue every 5 (five) minutes x 3 doses as needed for chest pain (if no relief after 2nd dose, proceed to the ED for an evaluation or call 911). 25 tablet 2   ondansetron (ZOFRAN) 4 MG tablet Take 1 tablet (4 mg total) by mouth every 6 (six) hours as needed for nausea or vomiting. 60 tablet 1   pantoprazole (PROTONIX) 40 MG tablet Take 1 tablet (40 mg total) by mouth 2 (two) times daily before a meal. 60 tablet 3   potassium chloride SA (KLOR-CON M) 20 MEQ tablet Take 1 tablet (20 mEq total) by mouth 2 (two) times daily. 60 tablet 6   rOPINIRole (REQUIP) 0.5 MG tablet Take 0.5 mg by mouth at bedtime.   0   spironolactone (ALDACTONE) 25 MG tablet Take 1 tablet (25 mg total) by mouth at bedtime. 90 tablet 3   sucralfate (CARAFATE) 1 GM/10ML suspension Take 10 mLs (1 g total) by mouth 4 (four) times daily. 420 mL 1   Vitamin D, Ergocalciferol, (DRISDOL) 1.25 MG (50000 UNIT) CAPS capsule Take 50,000 Units by mouth once a week. Takes on Mondays.     No current facility-administered medications on file prior to visit.    Allergies  Allergen Reactions   Alpha-Gal Shortness Of Breath, Nausea And Vomiting and Dermatitis   Other Shortness Of Breath, Diarrhea, Nausea And Vomiting and Nausea Only    All- red meats  Medications in Capsule form    Tape Other (See Comments)    PEELS SKIN OFF  (PAPER TAPE IS FINE)    Assessment/Plan:  1. Hyperlipidemia -  Problem   Hypertriglyceridemia  Cardiomyopathy, Ischemic   Hypertriglyceridemia Assessment:  LDL goal: < 70 mg/dl last LDLc 78 mg/dl ( 06/2022) TG 246 gaol <150 mg/dl  Tolerates Zetia and high intensity statins  and [name] well without any side  effects  Given close to goal LDLc and elevated TG will consider addition of fenofibrate To improve tolerability with statin will reduce dose of atorvastatin from 80 mg to 40 mg   Plan: Continue taking current medications Ezetimibe 10 mg daily ,[name] 2 gm twice daily  Reduce the dose of Lipitor from 80 mg 40 mg daily and start taking Tricor 145 mg daily LFT and fastin lipid labs due in 3 months of starting Tricor  In future may consider PCSK9i if LDLc remains elevated    Cardiomyopathy, ischemic Assessment/ plan:  In office BP 111/76 mmHg heart rate 102, home BP ~96/54 heart rate 74-90  Current medications: Farxiga 10 mg daily , losartan 25 mg daily, spironolactone 25 mg daily - tolerates them well Intolerant to Entresto due to hypotension  Given high HR will initiate metoprolol  Patient on bupropion for smoking cession which may increased metoprolol level so will start metoprolol XL low dose 12.5 mg daily Patient to follow up in 4 weeks with PharmD  Patient to bring  home BP readings and home BP cuff for validation at the next OV    Thank you,  Cammy Copa, Pharm.D [facility] [address] [place], [place] [address]  Phone: [phone]; Fax: [phone]

## 2022-07-28 NOTE — Telephone Encounter (Signed)
Erin [name] R/Mindy:  CT Abd with and without contrast (pancreatic procotol): Dx epig/ruq pain, 94mm pancreatic head cyst seen on MRI.  Authorized until 08/19/22, approval number [id number].   NOT SURE WHAT HAPPENS IF CAN'T GET HER IN BEFORE 08/19/22!

## 2022-07-28 NOTE — Telephone Encounter (Signed)
Can you schedule a peer to peer review for me for Friday (tomorrow) preferably at [place] or 11 ish.

## 2022-07-29 ENCOUNTER — Encounter: Payer: Self-pay | Admitting: *Deleted

## 2022-07-29 NOTE — Telephone Encounter (Signed)
Sent message via MyChart.

## 2022-07-29 NOTE — Telephone Encounter (Signed)
LMTRC to see if willing to go to [facility].

## 2022-07-29 NOTE — Telephone Encounter (Signed)
Pt has been scheduled for 08/07/22 arrive at 10:45 am, NPO 4 hours prior at [facility].

## 2022-07-31 ENCOUNTER — Other Ambulatory Visit: Payer: Self-pay | Admitting: Internal Medicine

## 2022-07-31 DIAGNOSIS — G6289 Other specified polyneuropathies: Secondary | ICD-10-CM

## 2022-08-01 ENCOUNTER — Ambulatory Visit: Payer: Medicaid Other

## 2022-08-05 NOTE — Progress Notes (Signed)
No ICM remote transmission received for 08/01/2022 and next ICM transmission scheduled for 08/22/2022.

## 2022-08-07 ENCOUNTER — Ambulatory Visit (HOSPITAL_BASED_OUTPATIENT_CLINIC_OR_DEPARTMENT_OTHER)
Admission: RE | Admit: 2022-08-07 | Discharge: 2022-08-07 | Disposition: A | Discharge status: Home / Self Care | Payer: Medicaid Other | Source: Ambulatory Visit | Attending: Gastroenterology | Admitting: Gastroenterology

## 2022-08-07 DIAGNOSIS — K862 Cyst of pancreas: Secondary | ICD-10-CM | POA: Diagnosis present

## 2022-08-07 DIAGNOSIS — R1013 Epigastric pain: Secondary | ICD-10-CM | POA: Insufficient documentation

## 2022-08-07 MED ORDER — IOHEXOL 350 MG/ML SOLN
100.0000 mL | Freq: Once | INTRAVENOUS | Status: AC | PRN
  Administered 2022-08-07: 100 mL via INTRAVENOUS

## 2022-08-08 ENCOUNTER — Telehealth: Payer: Self-pay

## 2022-08-08 ENCOUNTER — Other Ambulatory Visit (HOSPITAL_COMMUNITY): Payer: Self-pay

## 2022-08-08 ENCOUNTER — Telehealth (HOSPITAL_COMMUNITY): Payer: Self-pay | Admitting: Cardiology

## 2022-08-08 NOTE — Telephone Encounter (Signed)
Pt called to report her copay for farxiga will be  $400  HF pharmacy team reviewed and reports insurance is returning invalid/inactive Pt will need to contact insurance company directly   Pt aware and voiced understanding Strongly advised to iron out soon to prevent issues with other medications at next fill -pt voiced understanding

## 2022-08-08 NOTE — Telephone Encounter (Signed)
Spoke with patient regarding monthly ICM remote transmission. Advised have not received monthly report since January.  She stated she is at [place] a lot with her granddaughter and the granddaughter is on the ventilator again.  She stated she is not home very much for the reports to be sent automatically.  Advised will return to monitoring 91 day reports since unable to receive monthly reports.  Advised to call if she can resume monthly reports in the future.  She verbalized understanding and agreed with plan.

## 2022-08-08 NOTE — Telephone Encounter (Signed)
Returned call to patient as requested per voice mail.  She reports pharmacy is going to charge more than $400 for [place] which she can't afford.  Advised to call the prescribing office to request assistance with medication refill.

## 2022-08-17 ENCOUNTER — Other Ambulatory Visit (HOSPITAL_COMMUNITY): Payer: Self-pay

## 2022-08-17 ENCOUNTER — Other Ambulatory Visit: Payer: Self-pay | Admitting: Allergy & Immunology

## 2022-08-17 ENCOUNTER — Ambulatory Visit (INDEPENDENT_AMBULATORY_CARE_PROVIDER_SITE_OTHER): Payer: Medicaid Other

## 2022-08-17 ENCOUNTER — Encounter (HOSPITAL_COMMUNITY): Payer: Self-pay | Admitting: Cardiology

## 2022-08-17 DIAGNOSIS — I255 Ischemic cardiomyopathy: Secondary | ICD-10-CM

## 2022-08-17 LAB — CUP PACEART REMOTE DEVICE CHECK
Battery Remaining Longevity: 122 mo
Battery Voltage: 3.02 V
Brady Statistic RV Percent Paced: 0 %
Date Time Interrogation Session: 20240410081437
HighPow Impedance: 78 Ohm
Implantable Lead Connection Status: 753985
Implantable Lead Implant Date: 20211126
Implantable Lead Location: 753860
Implantable Pulse Generator Implant Date: 20211126
Lead Channel Impedance Value: 456 Ohm
Lead Channel Impedance Value: 570 Ohm
Lead Channel Pacing Threshold Amplitude: 0.75 V
Lead Channel Pacing Threshold Pulse Width: 0.4 ms
Lead Channel Sensing Intrinsic Amplitude: 2.625 mV
Lead Channel Sensing Intrinsic Amplitude: 2.625 mV
Lead Channel Setting Pacing Amplitude: 2 V
Lead Channel Setting Pacing Pulse Width: 0.4 ms
Lead Channel Setting Sensing Sensitivity: 0.3 mV
Zone Setting Status: 755011
Zone Setting Status: 755011

## 2022-08-18 ENCOUNTER — Other Ambulatory Visit (HOSPITAL_COMMUNITY): Payer: Self-pay | Admitting: Pharmacist

## 2022-08-18 ENCOUNTER — Other Ambulatory Visit (HOSPITAL_COMMUNITY): Payer: Self-pay

## 2022-08-18 MED ORDER — VASCEPA 1 G PO CAPS
2.0000 g | ORAL_CAPSULE | Freq: Two times a day (BID) | ORAL | 11 refills | Status: DC
  Filled 2022-08-18: qty 120, 30d supply, fill #0

## 2022-08-19 ENCOUNTER — Telehealth (HOSPITAL_COMMUNITY): Payer: Self-pay | Admitting: Pharmacy Technician

## 2022-08-19 NOTE — Telephone Encounter (Signed)
Advanced Heart Failure Patient Advocate Encounter  Received PA request from [employer] med[employer] for patient's [name]. Medicaid prefers brand [name] and does not require PA. After PA was submitted, medicaid came back stating that the patient has alternate pharmacy benefits. Called and spoke with the patient. Explained that she would need to reach out to social services for them to update that she does not have alternate insurance. Until this is complete, they are likely not going to pay for her medication. Patient voiced understanding.  Archer Asa, CPhT

## 2022-08-22 ENCOUNTER — Other Ambulatory Visit (HOSPITAL_COMMUNITY): Payer: Self-pay | Admitting: Family Medicine

## 2022-08-30 ENCOUNTER — Other Ambulatory Visit: Payer: Self-pay | Admitting: Internal Medicine

## 2022-08-30 DIAGNOSIS — G6289 Other specified polyneuropathies: Secondary | ICD-10-CM

## 2022-09-05 ENCOUNTER — Ambulatory Visit: Payer: Medicaid Other | Attending: Cardiovascular Disease | Admitting: Pharmacist

## 2022-09-05 ENCOUNTER — Telehealth: Payer: Self-pay | Admitting: Pharmacist

## 2022-09-05 ENCOUNTER — Telehealth: Payer: Self-pay | Admitting: "Endocrinology

## 2022-09-05 VITALS — BP 126/86 | HR 92 | Wt 130.2 lb

## 2022-09-05 DIAGNOSIS — E782 Mixed hyperlipidemia: Secondary | ICD-10-CM | POA: Diagnosis not present

## 2022-09-05 DIAGNOSIS — I5042 Chronic combined systolic (congestive) and diastolic (congestive) heart failure: Secondary | ICD-10-CM | POA: Diagnosis not present

## 2022-09-05 MED ORDER — ATORVASTATIN CALCIUM 80 MG PO TABS: 80.0000 mg | ORAL_TABLET | Freq: Every day | ORAL | 3 refills | Status: DC

## 2022-09-05 NOTE — Patient Instructions (Addendum)
We'll reach out to your insurance to get you back on Farxiga and [name]  Start checking your blood pressure at least 1 hour after taking your medications. Your home cuff is measuring accurately  We will submit information to your insurance for Repatha and let you know when we hear back.    Repatha is a subcutaneous injection given once every 2 weeks in the fatty tissue of your stomach or upper outer thigh. Store the medication in the fridge. You can let your dose warm up to room temperature for 30 minutes before injecting if you prefer. Repatha will lower your LDL cholesterol by 60% and helps to lower your chance of having a heart attack or stroke.  Repatha would replace your ezetimibe, you will continue on atorvastatin (back at your old 80mg  dose)  Recheck fasting labs on Monday, June 24th any time after 7:30am

## 2022-09-05 NOTE — Telephone Encounter (Signed)
Can someone update these labs?   

## 2022-09-05 NOTE — Addendum Note (Signed)
Addended by: Shona Needles on: 09/05/2022 04:38 PM   Modules accepted: Orders

## 2022-09-05 NOTE — Telephone Encounter (Signed)
Received message back from [facility] on Farxiga and Repatha PAs that "this member has alternative pharmacy benefits. [employer] is the payer of last resort. Please have the pharmacy bill the member's other insurance plan first. If the member feels that this is in error, please have them call member services at [phone]."   I do not see that pt has any other insurance. Have had this issue for other pts recently too, she will need to call in to Medicaid and tell them she has no other insurance coverage before they will review requests for coverage.  Spoke with pt, states she currently doesn't have other insurance but states she is getting [employer] Part D that will be active on 09/07/22.  ID: [id number] BIN: [id number] GRP: [id number] PCN: [id number]  Will submit new PAs on 5/1 through her Part D primary plan when it's active.

## 2022-09-05 NOTE — Progress Notes (Signed)
Patient ID: Erin [name]                 DOB: [birth year]                      MRN: [id number]     HPI: Erin [name] is a 62 y.o. female referred by Dr. Shirlee Latch to pharmacy clinic initially for lipid management, presents today for follow up. PMH is significant for CHF due to ICM, CAD s/p NSTEMI and PCI in 03/2016, and PAD. Most recent LVEF 30-35% on 06/13/2022.  At last visit with Advanced HF clinic on 07/11/2022, patient reported a marked increase in dizziness, with episodes occurring 3-4 times per day. She also reported an episode of presyncope. Patient stated that the dizziness limited her ability to complete ADLs. She noted that her BP at home was running in the 90s/50s mmHg. In-office BP at this visit was 114/84 mmHg. Entresto was discontinued at this time and replaced with losartan 25 mg daily. Patient's dapagliflozin and spironolactone were continued. She had stopped taking carvedilol because she thought she had been instructed to, but this was not resumed at this visit given symptomatic hypotension. Later, patient reported that she was charged > $400 for dapagliflozin at her pharmacy. This was determined to be an issue with her insurance.  At last visit with lipid clinic on 07/27/2022, patient reported high levels of stress due to her granddaughter being hospitalized and on life support. She stated that she tolerates her HF GDMT and lipid-lowering therapies well. She reported home BP readings ~90s/50s mmHg and HR 70s-80s. Patient noted that the frequency of dizziness episodes had decreased, but not gone away entirely. Last lipid panel on 06/13/2022 showed LDL 78 mg/dL and TG 409 mg/dL. Fenofibrate 145 mg daily was initiated. Atorvastatin dose was decreased to 40 mg daily, ezetimibe and [name] were continued.   Patient states that her BP has been running in the 90s-100s/60s-70s mmHg. She is taking her BP readings first thing in the morning prior to taking her medications. She reports that she is not  having episodes of dizziness after stopping on Entresto. She does note that she had a fall on 08/14/2022, but this was due to her legs "falling asleep" and not dizziness/orthoastasis. Denies SOB, chest pain, LEE, PNDs, orthopnea, and fatigue. Patient notes occasional episodes of palpitations (1-2 times per month) when she gets overly stressed or upset. She also endorses some abdominal edema when she drinks too much water. Patient has been unable to fill dapagliflozin due to issues with her insurance. In-office BP readings today were 136/93 mmHg and 121/80 mmHg on home blood pressure cuff. Manual BP reading was 126/86 mmHg and HR 92 bpm. Weight today 130.2 lbs, stable from home per patient.  Patient states that she had been out of [name] for ~2 weeks, but that Dr. Shirlee Latch sent in a prior authorization recently and she was able to get back on it in the past few days - confirmed pharmacy filled this on 4/21. Patient confirms that she tolerated atorvastatin 80 mg daily well, and is receptive to increasing the dose back to this. She is tolerating ezetimibe and fenofibrate well. Patient seems receptive to switching ezetimibe to Repatha for additional lipid-lowering.   Current CHF meds: losartan 25 mg daily, metoprolol succinate 12.5 mg daily, spironolactone 25 mg daily, furosemide 20 mg daily (with KCl 20 mEq BID) Previously tried: Entresto 24/26 mg BID BP goal: < 130/80 mmHg  Current HLD meds: atorvastatin 40  mg daily, ezetimibe 10 mg daily, fenofibrate 145 mg daily, and [name] 2 g BID Intolerances: None LDL goal: < 55 mg/dL  Family History: Mother - MI, stroke, HTN, DM; father - CAD, MI, stroke, HLD, HTN  Social History: Smoking 2-3 cigarettes every other day (on bupropion)   Diet: Low-salt, low-fat diet with mostly whole foods.  Exercise: Walks 2 miles every other day.  Home BP readings:  08/24/2022: 90/52 mmHg 08/25/2022: 97/60 mmHg 08/26/2022: 98/68 mmHg 09/04/2022: 98/70 mmHg 09/05/2022: 104/85  mmHg  Labs:  06/13/2022: TC 172, LDL 78, HDL 44, TG 249 (on atorvastatin 80 mg daily, ezetimibe 10 mg daily, [name] 2 g BID)  Wt Readings from Last 3 Encounters:  06/29/22 128 lb 6.4 oz (58.2 kg)  06/13/22 131 lb 3.2 oz (59.5 kg)  06/09/22 129 lb 3.2 oz (58.6 kg)   BP Readings from Last 3 Encounters:  07/27/22 111/76  07/11/22 114/84  06/29/22 106/69   Pulse Readings from Last 3 Encounters:  07/27/22 (!) 102  07/11/22 97  06/29/22 96    Renal function: CrCl cannot be calculated (Patient's most recent lab result is older than the maximum 21 days allowed.).  Past Medical History:  Diagnosis Date   AICD (automatic cardioverter/defibrillator) present    Allergic reaction to alpha-gal    Allergy 03/05/2022   Anemia    Anxiety state 03/25/2016   Arthritis    Basal cell carcinoma of forehead    Brain aneurysm    CHF (congestive heart failure) (HCC)    Coronary artery disease    a. 03/11/16 PCI with DES-->Prox/Mid Cx;  b. 03/14/16 PCI with DES x2-->RCA, EF 30-35%.   Depression 06/09/2010   Encounter for general adult medical examination with abnormal findings 05/04/2022   Essential hypertension    GERD (gastroesophageal reflux disease)    HFrEF (heart failure with reduced ejection fraction) (HCC)    a. 10/2016 Echo: EF 35-40%, Gr1 DD, mild focal basal septal hypertrophy, basal inflat, mid inflat, basal antlat AK. Mid infept/inf/antlat, apical lateral sev HK. Mod MR. mildly reduced RV fxn. Mild TR.   History of pneumonia    Hyperlipidemia    IBS (irritable bowel syndrome)    Ischemic cardiomyopathy    a. 10/2016 Echo: EF 35-40%, Gr1 DD.   Mitral regurgitation    Neuromuscular disorder (HCC)    NSTEMI (non-ST elevated myocardial infarction) (HCC) 03/10/2016   Pneumonia 03/2016   Shingles    Squamous cell cancer of skin of nose    Thrombocytosis 03/26/2016   Tobacco abuse    Trichimoniasis    Wears dentures    Wears glasses     Current Outpatient Medications on File Prior  to Visit  Medication Sig Dispense Refill   traMADol (ULTRAM) 50 MG tablet Take 1 tablet (50 mg total) by mouth 3 (three) times daily as needed. 30 tablet 2   aspirin EC 81 MG tablet Take 81 mg by mouth every evening.      atorvastatin (LIPITOR) 40 MG tablet Take 1 tablet (40 mg total) by mouth daily. 90 tablet 3   buPROPion (WELLBUTRIN SR) 150 MG 12 hr tablet Take 1 Tab daily for 3 days, then 2 Tab Daily (Patient taking differently: 150 mg daily. Take 1 Tab daily for 3 days, then 2 Tab Daily) 180 tablet 3   clopidogrel (PLAVIX) 75 MG tablet Take 1 tablet by mouth once daily 30 tablet 11   cyproheptadine (PERIACTIN) 4 MG tablet Take 2 tablets by mouth twice daily 60 tablet  0   dapagliflozin propanediol (FARXIGA) 10 MG TABS tablet TAKE 1 TABLET BY MOUTH ONCE DAILY BEFORE BREAKFAST 30 tablet 0   EPINEPHrine (EPIPEN 2-PAK) 0.3 mg/0.3 mL IJ SOAJ injection Inject 0.3 mg into the muscle as needed for anaphylaxis. (Patient not taking: Reported on 07/11/2022) 2 each 2   ezetimibe (ZETIA) 10 MG tablet Take 1 tablet by mouth once daily 30 tablet 11   fenofibrate (TRICOR) 145 MG tablet Take 1 tablet (145 mg total) by mouth daily. 90 tablet 3   furosemide (LASIX) 20 MG tablet Take 1 tablet (20 mg total) by mouth daily. 90 tablet 3   gabapentin (NEURONTIN) 300 MG capsule Take 1 capsule by mouth at bedtime 30 capsule 0   loperamide (IMODIUM) 2 MG capsule Take 2 mg by mouth 4 (four) times daily as needed for diarrhea or loose stools (ibs).     losartan (COZAAR) 25 MG tablet Take 1 tablet (25 mg total) by mouth daily. 90 tablet 3   metoprolol succinate (TOPROL XL) 25 MG 24 hr tablet Take 0.5 tablets (12.5 mg total) by mouth daily. 45 tablet 3   Multiple Vitamins-Minerals (MULTIVITAMIN WITH MINERALS) tablet Take 1 tablet by mouth daily.     nitroGLYCERIN (NITROSTAT) 0.4 MG SL tablet Place 1 tablet (0.4 mg total) under the tongue every 5 (five) minutes x 3 doses as needed for chest pain (if no relief after 2nd dose,  proceed to the ED for an evaluation or call 911). 25 tablet 2   ondansetron (ZOFRAN) 4 MG tablet Take 1 tablet (4 mg total) by mouth every 6 (six) hours as needed for nausea or vomiting. 60 tablet 1   pantoprazole (PROTONIX) 40 MG tablet Take 1 tablet (40 mg total) by mouth 2 (two) times daily before a meal. 60 tablet 3   potassium chloride SA (KLOR-CON M) 20 MEQ tablet Take 1 tablet (20 mEq total) by mouth 2 (two) times daily. 60 tablet 6   rOPINIRole (REQUIP) 0.5 MG tablet Take 0.5 mg by mouth at bedtime.   0   spironolactone (ALDACTONE) 25 MG tablet Take 1 tablet (25 mg total) by mouth at bedtime. 90 tablet 3   sucralfate (CARAFATE) 1 GM/10ML suspension Take 10 mLs (1 g total) by mouth 4 (four) times daily. 420 mL 1   [name] 1 g capsule Take 2 capsules (2 g total) by mouth 2 (two) times daily. 120 capsule 11   Vitamin D, Ergocalciferol, (DRISDOL) 1.25 MG (50000 UNIT) CAPS capsule Take 50,000 Units by mouth once a week. Takes on Mondays.     No current facility-administered medications on file prior to visit.    Allergies  Allergen Reactions   Alpha-Gal Shortness Of Breath, Nausea And Vomiting and Dermatitis   Other Shortness Of Breath, Diarrhea, Nausea And Vomiting and Nausea Only    All- red meats  Medications in Capsule form    Tape Other (See Comments)    PEELS SKIN OFF  (PAPER TAPE IS FINE)     Assessment/Plan:  1. CHF - Patient presents with last LVEF 30-35% on 06/13/2022, currently managed with losartan 25 mg daily, metoprolol succinate 12.5 mg daily, spironolactone 25 mg daily. Her home BP readings have been in the 90s-100s/60s-70s mmHg range, though she is not having any symptoms of hypotension or orthostasis. Patient is taking her BP readings in the AM prior to taking any of her medications. In-office BP averaged to ~127/85 mmHg, HR 92 bpm. Her home cuff is measuring accurately. Patient has been  having issues accessing her dapagliflozin, stating her insurance has been preventing  her from picking it up from the pharmacy at an affordable price.   - Resume dapagliflozin 10 mg daily - this requires a prior authorization which has been submitted - Continue losartan 25 mg daily, metoprolol succinate 12.5 mg daily, spironolactone 25 mg daily, and furosemide 20 mg daily (with KCl 20 mEq BID) - Counseled patient on taking home BP readings at least 1 hour after administering BP medications - Keep follow up with CHF clinic in a week. Lower BP has been preventing further titration of CHF medications  2. HLD - Patient presents with LDL 78 mg/dL, above goal < 55 mg/dL given premature ASCVD and additional risk factors. Patient confirms that she tolerated atorvastatin 80 mg daily well and is receptive to going back to that dose. She is also tolerating ezetimibe, [name], and fenofibrate well, though she was off of [name] for ~2 weeks due to insurance issues. Patient was counseled on lipid-lowering/CV effects, side effect profile, and administration instructions for Repatha, and she seems receptive to initiating this medication.   - Increase dose of atorvastatin to 80 mg daily  - Discontinue ezetimibe 10 mg daily - Initiate Repatha 140 mg SQ every 2 weeks - prior authorization submitted - Continue [name] 2 g BID and fenofibrate 145 mg daily - Recheck lipid panel on 10/31/2022 to assess need for further LDL/TG management  Patient seen with Michiel Cowboy, PharmD Candidate  Megan E. Supple, PharmD, BCACP, CPP [facility] [address], [place], [place] [address] Phone: [phone]; Fax: [phone] 09/05/2022 1:10 PM

## 2022-09-05 NOTE — Telephone Encounter (Signed)
Orders have been done

## 2022-09-07 MED ORDER — DAPAGLIFLOZIN PROPANEDIOL 10 MG PO TABS: 10.0000 mg | ORAL_TABLET | Freq: Every day | ORAL | 3 refills | Status: DC

## 2022-09-07 MED ORDER — REPATHA SURECLICK 140 MG/ML ~~LOC~~ SOAJ: 140.0000 mg | SUBCUTANEOUS | 3 refills | Status: DC

## 2022-09-07 NOTE — Telephone Encounter (Signed)
Repatha PA submitted, Key: [id number], approved through 05/09/23. Marcelline Deist available on plan without PA.  Rx sent in for both meds, pt aware to bring in copy of new [facility] for pharmacy to add. She will stop her ezetimibe when she starts Repatha, continue on higher dose of atorvastatin, fenofibrate, and [name], and will keep lab f/u scheduled for next month.

## 2022-09-08 ENCOUNTER — Other Ambulatory Visit (HOSPITAL_COMMUNITY): Payer: Self-pay

## 2022-09-09 NOTE — Progress Notes (Signed)
PCP: Johnette Abraham, MD HF Cardiology: [name]  62 y.o. with history of CAD, ischemic cardiomyopathy, and PAD was referred by [name] for evaluation of CHF.  She had NSTEMI in 11/17 with PCI to prox/mid LCx and RCA.  Subsequently, has developed ischemic cardiomyopathy.  Most recent echo in 1/21 showed EF 30-35%.  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   RHC/LHC done with exertional chest heaviness and dyspnea in 4/21 showed normal filling pressures, preserved cardiac output, and nonobstructive mild CAD.  ABIs were normal in 4/21.   In 8/21, she had syncope thought to be related to orthostasis from low BP in the setting of cardiac medication titration.  Entresto was decreased.   She was in the ER in 11/21 with chest pain.  Troponin and COVID-19 negative. Echo in 11/21 showed EF 30-35%, normal RV.   She was hospitalized in 6/22 with left-sided weakness/numbness.  No evidence for acute CVA, ?TIA.    Echo in 11/22 showed EF 30-35% with mild LV dilation, normal RV, mild-moderate MR, IVC normal.   Echo was done today and reviewed, EF remains 30-35% with normal RV.   Patient returns for followup of CHF.  No dyspnea walking on flat ground.  No problems walking up a flight of stairs.  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  No chest pain.  She is still smoking.  Under stress because her grand-daughter is sick in the hospital. BP elevated, she has not taken her meds today.  Weight up 4 lbs.   ECG (personally reviewed): NSR, normal  Labs (2/21): K 3.7, creatinine 0.82 Labs (4/21): K 4.3, creatinine 0.81, LDL 67, TGs 286 Labs (6/21): K 4.7, creatinine 1.22 Labs (11/21): K 3.7, creatinine 0.84, hgb 12.6, LDL 67, HDL 48, TGs 286 Labs (1/22). K 4.4, creatinine 1.03 Labs (6/22): K 3.5, creatinine 1.0 Labs (11/23): K 3.1, creatinine 0.97 Labs (12/23): K 3.8, creatinine 0.97  Medtronic device interrogation:  No AF/VT.  Stable  thoracic impedance.    PMH: 1. CAD: NSTEMI with DES to proximal and mid LCx in 11/17, staged DES x 2 to RCA later in 11/17.  - Cardiolite (10/20): EF < 30%, large inferior and inferolateral MI with mild peri-infarct ischemia.  - LHC (4/21): Patent stents, nonobstructive CAD.  2. Chronic systolic CHF: Ischemic cardiomyopathy. Medtronic ICD.  - Echo (10/20): EF 35-40%, lateral WMAs, normal RV. - Echo (1/21): EF 30-35%, mild LVH, normal RV - RHC (4/21): mean RA 5, PA 29/3, mean PCWP 12, CI 3.04 - Echo (11/21): EF 30-35%, normal RV.  - Echo (11/22): EF 30-35% with mild LV dilation, normal RV, mild-moderate MR, IVC normal.  - Echo (2/24): EF 30-35%, normal RV.  3. 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  4. Carotid stenosis: Carotid dopplers (37/34) with 28-76% LICA stenosis.  - Carotid dopplers (6/21): 40-59% BICA stenosis.  - Carotid dopplers (1/22): 81-15% RICA, 72-62% LICA.  - Carotid dopplers (0/35): 59-74% LICA - Carotid dopplers (16/38): 45-36% LICA 5. Prior smoker.  6. PAD: ABIs (4/21) Normal.  - Peripheral artery dopplers (12/21): bilateral plaque without focal stenosis.   Social History   Socioeconomic History   Marital status: Married    Spouse name: Not on file   Number of children: Not on file   Years of education: Not on file   Highest education level: Not on file  Occupational History   Occupation: CNA  Tobacco Use  Smoking status: Every Day    Packs/day: 0.25    Years: 15.00    Additional pack years: 0.00    Total pack years: 3.75    Types: Cigarettes   Smokeless tobacco: Never   Tobacco comments:    smokes a cigarette occasionally  Vaping Use   Vaping Use: Never used  Substance and Sexual Activity   Alcohol use: Yes    Comment: occasionally   Drug use: Not Currently    Types: Marijuana    Comment: former- 2017 last time   Sexual activity: Not Currently    Birth control/protection: Surgical    Comment: hyst  Other  Topics Concern   Not on file  Social History Narrative   Lives with husband in [place] in a one story home with a basement.  Has 4 children.  Works as a Lawyer.  Education: CNA school.    Social Determinants of Health   Financial Resource Strain: Low Risk  (06/11/2021)   Overall Financial Resource Strain (CARDIA)    Difficulty of Paying Living Expenses: Not hard at all  Food Insecurity: No Food Insecurity (06/11/2021)   Hunger Vital Sign    Worried About Running Out of Food in the Last Year: Never true    Ran Out of Food in the Last Year: Never true  Transportation Needs: No Transportation Needs (06/11/2021)   PRAPARE - Administrator, Civil Service (Medical): No    Lack of Transportation (Non-Medical): No  Physical Activity: Insufficiently Active (06/11/2021)   Exercise Vital Sign    Days of Exercise per Week: 4 days    Minutes of Exercise per Session: 30 min  Stress: Stress Concern Present (06/11/2021)   [employer] of Occupational Health - Occupational Stress Questionnaire    Feeling of Stress : Very much  Social Connections: Socially Isolated (06/11/2021)   Social Connection and Isolation Panel [NHANES]    Frequency of Communication with Friends and Family: Once a week    Frequency of Social Gatherings with Friends and Family: Never    Attends Religious Services: Never    Database administrator or Organizations: No    Attends Banker Meetings: Never    Marital Status: Married  Catering manager Violence: Not At Risk (06/11/2021)   Humiliation, Afraid, Rape, and Kick questionnaire    Fear of Current or Ex-Partner: No    Emotionally Abused: No    Physically Abused: No    Sexually Abused: No   Family History  Problem Relation Age of Onset   Stroke Mother    Hypertension Mother    Diabetes Mother    Heart attack Mother    Heart attack Father    Diabetes Father    Hypertension Father    CAD Father    Colon polyps Father 62       pre-cancerous    Stroke  Father    Dementia Father    Hyperlipidemia Father    Arthritis Father    COPD Father    Heart disease Father    Breast cancer Maternal Grandmother    Diabetes Maternal Grandmother    Cancer Maternal Grandfather        Tongue and esophageal   Anxiety disorder Daughter    Depression Daughter    Anxiety disorder Daughter    Heart failure Other    Cancer Paternal Grandmother    Colon cancer Neg Hx    ROS: All systems reviewed and negative except as per HPI.  Current Outpatient Medications  Medication Sig Dispense Refill   traMADol (ULTRAM) 50 MG tablet Take 1 tablet (50 mg total) by mouth 3 (three) times daily as needed. 30 tablet 2   aspirin EC 81 MG tablet Take 81 mg by mouth every evening.      atorvastatin (LIPITOR) 80 MG tablet Take 1 tablet (80 mg total) by mouth daily. 90 tablet 3   buPROPion (WELLBUTRIN SR) 150 MG 12 hr tablet Take 1 Tab daily for 3 days, then 2 Tab Daily (Patient taking differently: 150 mg daily. Take 1 Tab daily for 3 days, then 2 Tab Daily) 180 tablet 3   clopidogrel (PLAVIX) 75 MG tablet Take 1 tablet by mouth once daily 30 tablet 11   cyproheptadine (PERIACTIN) 4 MG tablet Take 2 tablets by mouth twice daily 60 tablet 0   dapagliflozin propanediol (FARXIGA) 10 MG TABS tablet Take 1 tablet (10 mg total) by mouth daily before breakfast. 90 tablet 3   EPINEPHrine (EPIPEN 2-PAK) 0.3 mg/0.3 mL IJ SOAJ injection Inject 0.3 mg into the muscle as needed for anaphylaxis. (Patient not taking: Reported on 07/11/2022) 2 each 2   Evolocumab (REPATHA SURECLICK) 140 MG/ML SOAJ Inject 140 mg into the skin every 14 (fourteen) days. 6 mL 3   fenofibrate (TRICOR) 145 MG tablet Take 1 tablet (145 mg total) by mouth daily. 90 tablet 3   furosemide (LASIX) 20 MG tablet Take 1 tablet (20 mg total) by mouth daily. 90 tablet 3   gabapentin (NEURONTIN) 300 MG capsule Take 1 capsule by mouth at bedtime 30 capsule 0   loperamide (IMODIUM) 2 MG capsule Take 2 mg by mouth 4 (four) times  daily as needed for diarrhea or loose stools (ibs).     losartan (COZAAR) 25 MG tablet Take 1 tablet (25 mg total) by mouth daily. 90 tablet 3   metoprolol succinate (TOPROL XL) 25 MG 24 hr tablet Take 0.5 tablets (12.5 mg total) by mouth daily. 45 tablet 3   Multiple Vitamins-Minerals (MULTIVITAMIN WITH MINERALS) tablet Take 1 tablet by mouth daily.     nitroGLYCERIN (NITROSTAT) 0.4 MG SL tablet Place 1 tablet (0.4 mg total) under the tongue every 5 (five) minutes x 3 doses as needed for chest pain (if no relief after 2nd dose, proceed to the ED for an evaluation or call 911). 25 tablet 2   ondansetron (ZOFRAN) 4 MG tablet Take 1 tablet (4 mg total) by mouth every 6 (six) hours as needed for nausea or vomiting. 60 tablet 1   pantoprazole (PROTONIX) 40 MG tablet Take 1 tablet (40 mg total) by mouth 2 (two) times daily before a meal. 60 tablet 3   potassium chloride SA (KLOR-CON M) 20 MEQ tablet Take 1 tablet (20 mEq total) by mouth 2 (two) times daily. 60 tablet 6   rOPINIRole (REQUIP) 0.5 MG tablet Take 0.5 mg by mouth at bedtime.   0   spironolactone (ALDACTONE) 25 MG tablet Take 1 tablet (25 mg total) by mouth at bedtime. 90 tablet 3   sucralfate (CARAFATE) 1 GM/10ML suspension Take 10 mLs (1 g total) by mouth 4 (four) times daily. 420 mL 1   [name] 1 g capsule Take 2 capsules (2 g total) by mouth 2 (two) times daily. 120 capsule 11   Vitamin D, Ergocalciferol, (DRISDOL) 1.25 MG (50000 UNIT) CAPS capsule Take 50,000 Units by mouth once a week. Takes on Mondays.     No current facility-administered medications for this visit.   There  were no vitals taken for this visit. General: NAD Neck: No JVD, no thyromegaly or thyroid nodule.  Lungs: Occasional rhonchi.  CV: Nondisplaced PMI.  Heart regular S1/S2, no S3/S4, no murmur.  No peripheral edema.  No carotid bruit.  Normal pedal pulses.  Abdomen: Soft, nontender, no hepatosplenomegaly, no distention.  Skin: Intact without lesions or rashes.   Neurologic: Alert and oriented x 3.  Psych: Normal affect. Extremities: No clubbing or cyanosis.  HEENT: Normal.   Assessment/Plan: 1. CAD: S/p NSTEMI in 11/17 with DEX to LCx and DES x 2 to RCA.  Cardiolite in 10/20 with EF <30%, inferior/inferolateral infarct with peri-infarct ischemia. LHC in 4/21 showed nonobstructive mild CAD.  No chest pain.  - Continue atorvastatin and Zetia. Check lipids today.  - Continue ASA 81 and Plavix 75 mg daily for now.  She will continue Plavix as long as Dr. Corliss Skains feels it is needed post her cranial circulation intervention.  2. Chronic systolic CHF: Ischemic cardiomyopathy. Echo today showed stable EF 30-35%.  RHC in 4/21 with normal filling pressures and preserved cardiac output.  NYHA class II symptoms.  She is not volume overloaded by exam or Optivol.  - She felt better when taking Entresto and is willing to try it again.  I will have her stop losartan and start Entresto 24/26 bid. BMET/BNP today and BMET in 10 days.  - With starting Entresto, decrease Lasix to 20 mg daily.   - Continue Coreg 6.25 mg bid.      - Continue spironolactone 25 mg daily.   - Continue dapagliflozin 10 mg daily.   3. Carotid stenosis: Repeat carotids in 12/24.  4. Smoking: She wants to quit, had bad dreams with Chantix.  - I will start her on Wellbutrin.  5. 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  - Continue Plavix per Dr. Corliss Skains.   Followup 3 wks with HF pharmacist for med titration, followup 3 months with APP.    Anderson Malta [name] 09/09/2022

## 2022-09-10 ENCOUNTER — Other Ambulatory Visit: Payer: Self-pay | Admitting: Internal Medicine

## 2022-09-10 DIAGNOSIS — G6289 Other specified polyneuropathies: Secondary | ICD-10-CM

## 2022-09-12 ENCOUNTER — Ambulatory Visit (HOSPITAL_COMMUNITY)
Admission: RE | Admit: 2022-09-12 | Discharge: 2022-09-12 | Disposition: A | Discharge status: Home / Self Care | Payer: Medicare HMO | Source: Ambulatory Visit | Attending: Family Medicine | Admitting: Family Medicine

## 2022-09-12 ENCOUNTER — Encounter (HOSPITAL_COMMUNITY): Payer: Self-pay

## 2022-09-12 ENCOUNTER — Other Ambulatory Visit (HOSPITAL_COMMUNITY): Payer: Self-pay

## 2022-09-12 VITALS — BP 104/78 | HR 91 | Wt 129.4 lb

## 2022-09-12 DIAGNOSIS — Z720 Tobacco use: Secondary | ICD-10-CM

## 2022-09-12 DIAGNOSIS — I252 Old myocardial infarction: Secondary | ICD-10-CM | POA: Insufficient documentation

## 2022-09-12 DIAGNOSIS — I5022 Chronic systolic (congestive) heart failure: Secondary | ICD-10-CM | POA: Diagnosis not present

## 2022-09-12 DIAGNOSIS — Z955 Presence of coronary angioplasty implant and graft: Secondary | ICD-10-CM | POA: Insufficient documentation

## 2022-09-12 DIAGNOSIS — Z79899 Other long term (current) drug therapy: Secondary | ICD-10-CM | POA: Insufficient documentation

## 2022-09-12 DIAGNOSIS — I255 Ischemic cardiomyopathy: Secondary | ICD-10-CM | POA: Insufficient documentation

## 2022-09-12 DIAGNOSIS — I671 Cerebral aneurysm, nonruptured: Secondary | ICD-10-CM

## 2022-09-12 DIAGNOSIS — Z8249 Family history of ischemic heart disease and other diseases of the circulatory system: Secondary | ICD-10-CM | POA: Insufficient documentation

## 2022-09-12 DIAGNOSIS — I6529 Occlusion and stenosis of unspecified carotid artery: Secondary | ICD-10-CM | POA: Diagnosis not present

## 2022-09-12 DIAGNOSIS — F17210 Nicotine dependence, cigarettes, uncomplicated: Secondary | ICD-10-CM | POA: Diagnosis not present

## 2022-09-12 DIAGNOSIS — I2510 Atherosclerotic heart disease of native coronary artery without angina pectoris: Secondary | ICD-10-CM | POA: Insufficient documentation

## 2022-09-12 DIAGNOSIS — Z95810 Presence of automatic (implantable) cardiac defibrillator: Secondary | ICD-10-CM | POA: Diagnosis not present

## 2022-09-12 DIAGNOSIS — Z7902 Long term (current) use of antithrombotics/antiplatelets: Secondary | ICD-10-CM | POA: Diagnosis not present

## 2022-09-12 LAB — BASIC METABOLIC PANEL
Anion gap: 10 (ref 5–15)
BUN: 12 mg/dL (ref 6–20)
CO2: 22 mmol/L (ref 22–32)
Calcium: 9.5 mg/dL (ref 8.9–10.3)
Chloride: 104 mmol/L (ref 98–111)
Creatinine, Ser: 1.18 mg/dL — ABNORMAL HIGH (ref 0.44–1.00)
GFR, Estimated: 53 mL/min — ABNORMAL LOW (ref >60)
Glucose, Bld: 93 mg/dL (ref 70–99)
Potassium: 4.1 mmol/L (ref 3.5–5.1)
Sodium: 136 mmol/L (ref 135–145)

## 2022-09-12 LAB — BRAIN NATRIURETIC PEPTIDE: B Natriuretic Peptide: 66.6 pg/mL (ref 0.0–100.0)

## 2022-09-12 MED ORDER — DAPAGLIFLOZIN PROPANEDIOL 10 MG PO TABS
10.0000 mg | ORAL_TABLET | Freq: Every day | ORAL | 11 refills | Status: DC
Start: 2022-09-12 — End: 2023-10-03

## 2022-09-12 MED ORDER — VARENICLINE TARTRATE 1 MG PO TABS
1.0000 mg | ORAL_TABLET | Freq: Two times a day (BID) | ORAL | 3 refills | Status: DC
Start: 2022-09-12 — End: 2023-07-18

## 2022-09-12 MED ORDER — VARENICLINE TARTRATE (STARTER) 0.5 MG X 11 & 1 MG X 42 PO TBPK
ORAL_TABLET | ORAL | 0 refills | Status: DC
Start: 2022-09-12 — End: 2023-01-12

## 2022-09-12 MED ORDER — LOSARTAN POTASSIUM 25 MG PO TABS: 25.0000 mg | ORAL_TABLET | Freq: Every day | ORAL | 3 refills | Status: DC

## 2022-09-12 NOTE — Progress Notes (Signed)
Provided patient with Farxiga samples:   Farxiga 10 mg tablets  LN [id number]  Exp 03/08/25  # 7

## 2022-09-12 NOTE — Patient Instructions (Signed)
Start Farxiga 10 mg daily. Take Losartan 25 mg daily in evening.  Chantix starter pack Rx sent for first month. Chantix maintenance Rx sent for following months. Labs today - will call you if abnormal. Return to see Dr. Shirlee Latch in three months - see below. Please call us at [phone] if any questions or concerns prior to your next visit.

## 2022-09-13 ENCOUNTER — Telehealth (HOSPITAL_COMMUNITY): Payer: Self-pay

## 2022-09-13 ENCOUNTER — Other Ambulatory Visit (HOSPITAL_COMMUNITY): Payer: Self-pay

## 2022-09-13 NOTE — Telephone Encounter (Signed)
Advanced Heart Failure Patient Advocate Encounter  Prior authorization for Marcelline Deist has been submitted and approved. Test billing returns $4 for 90 day supply.  Key: [id number] Effective: 09/12/2022 to 09/12/2023  Burnell Blanks, CPhT Rx Patient Advocate Phone: [phone]

## 2022-09-15 ENCOUNTER — Other Ambulatory Visit: Payer: Self-pay

## 2022-09-15 ENCOUNTER — Ambulatory Visit: Payer: 59 | Admitting: "Endocrinology

## 2022-09-15 MED ORDER — NITROGLYCERIN 0.4 MG SL SUBL: 0.4000 mg | SUBLINGUAL_TABLET | SUBLINGUAL | 2 refills | Status: DC | PRN

## 2022-09-15 NOTE — Telephone Encounter (Signed)
This is a CHF pt 

## 2022-09-19 ENCOUNTER — Other Ambulatory Visit: Payer: Self-pay

## 2022-09-19 MED ORDER — NITROGLYCERIN 0.4 MG SL SUBL: 0.4000 mg | SUBLINGUAL_TABLET | SUBLINGUAL | 2 refills | Status: AC | PRN

## 2022-09-23 NOTE — Progress Notes (Signed)
Remote ICD transmission.   

## 2022-09-27 ENCOUNTER — Ambulatory Visit: Payer: Medicaid Other | Admitting: Internal Medicine

## 2022-09-27 ENCOUNTER — Encounter: Payer: Self-pay | Admitting: Internal Medicine

## 2022-09-28 ENCOUNTER — Other Ambulatory Visit: Payer: Self-pay | Admitting: Internal Medicine

## 2022-09-28 DIAGNOSIS — G6289 Other specified polyneuropathies: Secondary | ICD-10-CM

## 2022-09-29 ENCOUNTER — Emergency Department (HOSPITAL_COMMUNITY)
Admission: EM | Admit: 2022-09-29 | Discharge: 2022-09-30 | Disposition: A | Discharge status: Home / Self Care | Payer: Medicare HMO | Attending: Emergency Medicine | Admitting: Emergency Medicine

## 2022-09-29 ENCOUNTER — Other Ambulatory Visit: Payer: Self-pay

## 2022-09-29 ENCOUNTER — Emergency Department (HOSPITAL_COMMUNITY): Payer: Medicare HMO

## 2022-09-29 ENCOUNTER — Encounter (HOSPITAL_COMMUNITY): Payer: Self-pay | Admitting: Emergency Medicine

## 2022-09-29 DIAGNOSIS — R110 Nausea: Secondary | ICD-10-CM | POA: Insufficient documentation

## 2022-09-29 DIAGNOSIS — Z79899 Other long term (current) drug therapy: Secondary | ICD-10-CM | POA: Diagnosis not present

## 2022-09-29 DIAGNOSIS — I10 Essential (primary) hypertension: Secondary | ICD-10-CM | POA: Diagnosis not present

## 2022-09-29 DIAGNOSIS — R0789 Other chest pain: Secondary | ICD-10-CM | POA: Insufficient documentation

## 2022-09-29 DIAGNOSIS — R0689 Other abnormalities of breathing: Secondary | ICD-10-CM | POA: Diagnosis not present

## 2022-09-29 DIAGNOSIS — Z95810 Presence of automatic (implantable) cardiac defibrillator: Secondary | ICD-10-CM | POA: Diagnosis not present

## 2022-09-29 DIAGNOSIS — I2510 Atherosclerotic heart disease of native coronary artery without angina pectoris: Secondary | ICD-10-CM | POA: Insufficient documentation

## 2022-09-29 DIAGNOSIS — Z7982 Long term (current) use of aspirin: Secondary | ICD-10-CM | POA: Diagnosis not present

## 2022-09-29 DIAGNOSIS — R42 Dizziness and giddiness: Secondary | ICD-10-CM | POA: Diagnosis not present

## 2022-09-29 DIAGNOSIS — R079 Chest pain, unspecified: Secondary | ICD-10-CM | POA: Diagnosis present

## 2022-09-29 LAB — CBC
HCT: 38.5 % (ref 36.0–46.0)
Hemoglobin: 12.8 g/dL (ref 12.0–15.0)
MCH: 28.2 pg (ref 26.0–34.0)
MCHC: 33.2 g/dL (ref 30.0–36.0)
MCV: 84.8 fL (ref 80.0–100.0)
Platelets: 473 10*3/uL — ABNORMAL HIGH (ref 150–400)
RBC: 4.54 MIL/uL (ref 3.87–5.11)
RDW: 14.8 % (ref 11.5–15.5)
WBC: 8.1 10*3/uL (ref 4.0–10.5)
nRBC: 0 % (ref 0.0–0.2)

## 2022-09-29 LAB — BASIC METABOLIC PANEL
Anion gap: 10 (ref 5–15)
BUN: 11 mg/dL (ref 6–20)
CO2: 22 mmol/L (ref 22–32)
Calcium: 9.6 mg/dL (ref 8.9–10.3)
Chloride: 106 mmol/L (ref 98–111)
Creatinine, Ser: 1.11 mg/dL — ABNORMAL HIGH (ref 0.44–1.00)
GFR, Estimated: 57 mL/min — ABNORMAL LOW (ref >60)
Glucose, Bld: 101 mg/dL — ABNORMAL HIGH (ref 70–99)
Potassium: 3.2 mmol/L — ABNORMAL LOW (ref 3.5–5.1)
Sodium: 138 mmol/L (ref 135–145)

## 2022-09-29 LAB — BRAIN NATRIURETIC PEPTIDE: B Natriuretic Peptide: 111 pg/mL — ABNORMAL HIGH (ref 0.0–100.0)

## 2022-09-29 LAB — TROPONIN I (HIGH SENSITIVITY): Troponin I (High Sensitivity): 16 ng/L (ref <18)

## 2022-09-29 LAB — PROTIME-INR
INR: 0.9 (ref 0.8–1.2)
Prothrombin Time: 12.7 seconds (ref 11.4–15.2)

## 2022-09-29 MED ORDER — LACTATED RINGERS IV BOLUS
500.0000 mL | Freq: Once | INTRAVENOUS | Status: AC
  Administered 2022-09-29: 500 mL via INTRAVENOUS

## 2022-09-29 MED ORDER — ONDANSETRON 4 MG PO TBDP
4.0000 mg | ORAL_TABLET | Freq: Once | ORAL | Status: AC
  Administered 2022-09-29: 4 mg via ORAL
  Filled 2022-09-29: qty 1

## 2022-09-29 MED ORDER — POTASSIUM CHLORIDE CRYS ER 20 MEQ PO TBCR
40.0000 meq | EXTENDED_RELEASE_TABLET | Freq: Once | ORAL | Status: AC
  Administered 2022-09-29: 40 meq via ORAL
  Filled 2022-09-29: qty 2

## 2022-09-29 NOTE — ED Triage Notes (Signed)
Pt via POV c/o heaviness in central chest for 2 hours with nausea for 3 weeks and headache since several days ago.  No known sick contacts. Pt feels dizzy with sweating and chills but denies SOB and fever. Hx CHF and defibrillator for a fib with MI in 2017 with 2 stents placed.

## 2022-09-29 NOTE — ED Notes (Addendum)
Ambulated pt. Heart Rate 120-130. Spo2 100%-95%. Pt state she was feeling dizzy and very short of breath.

## 2022-09-29 NOTE — ED Notes (Signed)
Patient transported to X-ray 

## 2022-09-29 NOTE — ED Provider Notes (Signed)
[place] EMERGENCY DEPARTMENT AT [facility] Provider Note   CSN: [id number] Arrival date & time: 09/29/22  2210     History  Chief Complaint  Patient presents with   Chest Pain    Erin [name] is a 62 y.o. female.  HPI    62 year old female with history of ischemic cardiomyopathy with EF of 30%, defibrillator, hypertension, hyperlipidemia.  Patient reports that she has had nausea for the last several weeks.  She essentially has been having dry heaving.  With this she does not have any abdominal pain, sick contacts, diarrhea.  She has had reduced p.o. intake.  About 7 PM she started having chest heaviness.  Chest heaviness is central, nonradiating.  She denies any associated shortness of breath, but did feel slightly dizzy.  Patient has history of CAD with stents placed.  With that her chest pain was radiating down the left upper extremity.  Patient denies any productive cough, URI-like symptoms.  Home Medications Prior to Admission medications   Medication Sig Start Date End Date Taking? Authorizing Provider  aspirin EC 81 MG tablet Take 81 mg by mouth every evening.     [provider]  atorvastatin (LIPITOR) 80 MG tablet Take 1 tablet (80 mg total) by mouth daily. 09/05/22   Laurey Morale, MD  clopidogrel (PLAVIX) 75 MG tablet Take 1 tablet by mouth once daily 07/12/22   Laurey Morale, MD  cyproheptadine (PERIACTIN) 4 MG tablet Take 2 tablets by mouth twice daily 08/17/22   Alfonse Spruce, MD  dapagliflozin propanediol (FARXIGA) 10 MG TABS tablet Take 1 tablet (10 mg total) by mouth daily before breakfast. 09/12/22   Milford, Anderson Malta, FNP  EPINEPHrine (EPIPEN 2-PAK) 0.3 mg/0.3 mL IJ SOAJ injection Inject 0.3 mg into the muscle as needed for anaphylaxis. 04/23/21   Alfonse Spruce, MD  Evolocumab (REPATHA SURECLICK) 140 MG/ML SOAJ Inject 140 mg into the skin every 14 (fourteen) days. 09/07/22   Laurey Morale, MD  fenofibrate (TRICOR)  145 MG tablet Take 1 tablet (145 mg total) by mouth daily. 07/27/22   Laurey Morale, MD  furosemide (LASIX) 20 MG tablet Take 1 tablet (20 mg total) by mouth daily. 06/13/22   Laurey Morale, MD  gabapentin (NEURONTIN) 300 MG capsule Take 1 capsule by mouth at bedtime 09/28/22   Billie Lade, MD  loperamide (IMODIUM) 2 MG capsule Take 2 mg by mouth 4 (four) times daily as needed for diarrhea or loose stools (ibs).    [provider]  losartan (COZAAR) 25 MG tablet Take 1 tablet (25 mg total) by mouth at bedtime. 09/12/22   Milford, Anderson Malta, FNP  metoprolol succinate (TOPROL XL) 25 MG 24 hr tablet Take 0.5 tablets (12.5 mg total) by mouth daily. 07/27/22   Laurey Morale, MD  Multiple Vitamins-Minerals (MULTIVITAMIN WITH MINERALS) tablet Take 1 tablet by mouth daily.    [provider]  nitroGLYCERIN (NITROSTAT) 0.4 MG SL tablet Place 1 tablet (0.4 mg total) under the tongue every 5 (five) minutes x 3 doses as needed for chest pain (if no relief after 2nd dose, proceed to the ED for an evaluation or call 911). 09/19/22   Laurey Morale, MD  ondansetron (ZOFRAN) 4 MG tablet Take 1 tablet (4 mg total) by mouth every 6 (six) hours as needed for nausea or vomiting. 06/29/22   Tiffany Kocher, PA-C  pantoprazole (PROTONIX) 40 MG tablet Take 1 tablet (40 mg total) by  mouth 2 (two) times daily before a meal. 04/20/22   Tiffany Kocher, PA-C  potassium chloride SA (KLOR-CON M) 20 MEQ tablet Take 1 tablet (20 mEq total) by mouth 2 (two) times daily. 07/11/22   Laurey Morale, MD  rOPINIRole (REQUIP) 0.5 MG tablet Take 0.5 mg by mouth at bedtime.  11/13/17   [provider]  spironolactone (ALDACTONE) 25 MG tablet Take 1 tablet (25 mg total) by mouth at bedtime. 03/07/22   Laurey Morale, MD  sucralfate (CARAFATE) 1 GM/10ML suspension Take 10 mLs (1 g total) by mouth 4 (four) times daily. 04/27/22 04/27/23  Lanelle Bal, DO  traMADol (ULTRAM) 50 MG tablet Take 1 tablet (50 mg  total) by mouth 3 (three) times daily as needed. 07/27/22   Cristie Hem, PA-C  varenicline (CHANTIX) 1 MG tablet Take 1 tablet (1 mg total) by mouth 2 (two) times daily. 09/12/22   Milford, Anderson Malta, FNP  Varenicline Tartrate, Starter, 0.5 MG X 11 & 1 MG X 42 TBPK Take 0.5 daily on days 1 - 3 Take 0.5 bid on days 4 - 7 Then take 1 mg bid thereafter 09/12/22   Jacklynn Ganong, FNP  [name] 1 g capsule Take 2 capsules (2 g total) by mouth 2 (two) times daily. 08/18/22   Laurey Morale, MD  Vitamin D, Ergocalciferol, (DRISDOL) 1.25 MG (50000 UNIT) CAPS capsule Take 50,000 Units by mouth once a week. Takes on Mondays. 12/07/21   [provider]      Allergies    Alpha-gal, Other, and Tape    Review of Systems   Review of Systems  All other systems reviewed and are negative.   Physical Exam Updated Vital Signs BP 124/72   Pulse 78   Temp 98.4 F (36.9 C) (Oral)   Resp 17   Ht 5' (1.524 m)   Wt 59.9 kg   SpO2 100%   BMI 25.78 kg/m  Physical Exam Vitals and nursing note reviewed.  Constitutional:      Appearance: She is well-developed.  HENT:     Head: Atraumatic.  Cardiovascular:     Rate and Rhythm: Normal rate.     Heart sounds: Normal heart sounds.  Pulmonary:     Effort: Pulmonary effort is normal.     Breath sounds: Normal breath sounds.  Musculoskeletal:     Cervical back: Normal range of motion and neck supple.  Skin:    General: Skin is warm and dry.  Neurological:     Mental Status: She is alert and oriented to person, place, and time.     ED Results / Procedures / Treatments   Labs (all labs ordered are listed, but only abnormal results are displayed) Labs Reviewed  BASIC METABOLIC PANEL - Abnormal; Notable for the following components:      Result Value   Potassium 3.2 (*)    Glucose, Bld 101 (*)    Creatinine, Ser 1.11 (*)    GFR, Estimated 57 (*)    All other components within normal limits  CBC - Abnormal; Notable for the following  components:   Platelets 473 (*)    All other components within normal limits  BRAIN NATRIURETIC PEPTIDE - Abnormal; Notable for the following components:   B Natriuretic Peptide 111.0 (*)    All other components within normal limits  PROTIME-INR  TROPONIN I (HIGH SENSITIVITY)  TROPONIN I (HIGH SENSITIVITY)    EKG EKG Interpretation  Date/Time:  Thursday Sep 29 2022 22:19:58 EDT Ventricular Rate:  103 PR Interval:  124 QRS Duration: 90 QT Interval:  340 QTC Calculation: 445 R Axis:   19 Text Interpretation: Sinus tachycardia Possible Left atrial enlargement Borderline ECG When compared with ECG of 13-Jun-2022 09:45, No significant change was found No acute changes Confirmed by Derwood Kaplan [phone]) on 09/29/2022 10:46:01 PM  Radiology DG Chest 2 View  Result Date: 09/29/2022 CLINICAL DATA:  Heaviness in central chest x2 hours. EXAM: CHEST - 2 VIEW COMPARISON:  April 13, 2022 FINDINGS: A single lead ventricular pacer is noted. The heart size and mediastinal contours are within normal limits. Both lungs are clear. The visualized skeletal structures are unremarkable. IMPRESSION: No active cardiopulmonary disease. Electronically Signed   By: Aram Candela M.D.   On: 09/29/2022 22:39    Procedures Procedures    Medications Ordered in ED Medications  potassium chloride SA (KLOR-CON M) CR tablet 40 mEq (40 mEq Oral Given 09/29/22 2329)  ondansetron (ZOFRAN-ODT) disintegrating tablet 4 mg (4 mg Oral Given 09/29/22 2329)  lactated ringers bolus 500 mL (0 mLs Intravenous Stopped 09/30/22 0030)    ED Course/ Medical Decision Making/ A&P                             Medical Decision Making Amount and/or Complexity of Data Reviewed Labs: ordered. Radiology: ordered.  Risk Prescription drug management.  62 year old female comes in with chief complaint of chest pain.  That is her primary complaint.  She also states that she has had nausea with dry heaving for the last 3 weeks  and some mild headaches.  Differential diagnosis considered for this patient includes acute coronary syndrome, PUD, CHF exacerbation, pulmonary embolism, symptomatic A-fib, pneumonia. I also considered ischemic colitis in the differential diagnosis, but patient has no abdominal pain.  Medication side effects, severe electrolyte abnormality, profound dehydration also considered.  I have reviewed patient's medications and also reviewed patient's outpatient cardiology visit including with heart failure service just recently.  I have also reviewed patient's GI workup including MRI of the abdomen, gastric emptying studies, GI notes and a recent CT abdomen pelvis with contrast that was done on 08-07-2022 that revealed cystic lesions or pancreas.  At this time, plan is to get basic labs.  We will make sure there is no electrolyte abnormality, AKI, elevated liver enzymes or lipase.  We will hydrate the patient.  We will also get troponins to make sure there is no evidence of acute coronary syndrome.  If patient's results are reassuring, then she can be discharged.  Ambulatory pulse ox, p.o. challenge also ordered.  Patient indicates to me that she is under a lot of stress.  She recently lost her daughter and her granddaughter has myasthenia gravis and has had multiple admissions to ICU and has been intubated.  Pt's care signed out to incoming team. Final Clinical Impression(s) / ED Diagnoses Final diagnoses:  Atypical chest pain  Nausea    Rx / DC Orders ED Discharge Orders     None         Derwood Kaplan, MD 09/30/22 1507

## 2022-09-30 LAB — TROPONIN I (HIGH SENSITIVITY): Troponin I (High Sensitivity): 17 ng/L (ref <18)

## 2022-09-30 NOTE — Discharge Instructions (Signed)
Continue taking Zofran as prescribed.  Follow-up with your gastroenterologist if symptoms persist.

## 2022-09-30 NOTE — ED Provider Notes (Signed)
  Physical Exam  BP 124/72   Pulse 78   Temp 98.4 F (36.9 C) (Oral)   Resp 17   Ht 5' (1.524 m)   Wt 59.9 kg   SpO2 100%   BMI 25.78 kg/m   Physical Exam Vitals and nursing note reviewed.  Constitutional:      Appearance: She is well-developed.  HENT:     Head: Normocephalic and atraumatic.  Pulmonary:     Effort: Pulmonary effort is normal.  Musculoskeletal:        General: Normal range of motion.  Skin:    General: Skin is warm and dry.  Neurological:     Mental Status: She is alert.     Procedures  Procedures  ED Course / MDM    Medical Decision Making Amount and/or Complexity of Data Reviewed Labs: ordered. Radiology: ordered.  Risk Prescription drug management.   Patient is a 62 year old female presenting with complaints of chest heaviness and nausea.  She describes a 1 week history of decreased p.o. intake due to nausea.  Patient initially seen by the 3-12 provider, then care signed out to me awaiting results of second troponin.  The second troponin has returned and is negative.  Patient reassessed and is resting comfortably.  I feel as though patient can safely be discharged.  I have advised her to continue taking her Zofran and to follow-up with her gastroenterologist if symptoms persist.  Symptoms very unlikely to be related to a cardiac etiology.      Geoffery Lyons, MD 09/30/22 [id number]

## 2022-10-13 ENCOUNTER — Encounter: Payer: Self-pay | Admitting: Orthopaedic Surgery

## 2022-10-17 ENCOUNTER — Other Ambulatory Visit: Payer: Self-pay | Admitting: Gastroenterology

## 2022-10-19 ENCOUNTER — Other Ambulatory Visit: Payer: Self-pay | Admitting: Orthopaedic Surgery

## 2022-10-19 ENCOUNTER — Ambulatory Visit (INDEPENDENT_AMBULATORY_CARE_PROVIDER_SITE_OTHER): Payer: Medicare HMO | Admitting: Orthopaedic Surgery

## 2022-10-19 DIAGNOSIS — G8929 Other chronic pain: Secondary | ICD-10-CM

## 2022-10-19 DIAGNOSIS — M25512 Pain in left shoulder: Secondary | ICD-10-CM | POA: Diagnosis not present

## 2022-10-19 DIAGNOSIS — M25511 Pain in right shoulder: Secondary | ICD-10-CM

## 2022-10-19 NOTE — Progress Notes (Signed)
Office Visit Note   Patient: Erin [name]           Date of Birth: [birth year]           MRN: [id number] Visit Date: 10/19/2022              Requested by: Billie Lade, MD [address] [place],  [place] [address] PCP: Billie Lade, MD   Assessment & Plan: Visit Diagnoses:  1. Chronic pain of both shoulders     Plan: Impression is chronic bilateral shoulder pain with underlying glenohumeral osteoarthritis and possible rotator cuff pathology.  At this point, the patient's symptoms continue to worsen.  She has not had significant relief from glenohumeral cortisone injections in the past.  Would like to go ahead and order MRIs of both shoulders to assess for structural abnormalities.  These will need to be done in a hospital setting as she has a defibrillator.  She will follow-up with [place] once her MRIs have been completed.  Call with concerns or questions in meantime.  Follow-Up Instructions: Return for f/u after MRI.   Orders:  No orders of the defined types were placed in this encounter.  No orders of the defined types were placed in this encounter.     Procedures: No procedures performed   Clinical Data: No additional findings.   Subjective: No chief complaint on file.   HPI patient is a pleasant 62 year old female who comes in today with chronic bilateral shoulder pain right greater than left.  Symptoms are constant but worse with activity.  She has not taken tramadol and gabapentin without significant relief.  History of glenohumeral osteoarthritis.  She has undergone multiple glenohumeral cortisone injections with only temporary relief.  Symptoms are constant but worse with activity.  MRIs of both shoulders were ordered in the fall to assess the degree of arthritis in addition to her rotator cuff, however these were denied as she has a defibrillator and these needed to be done in the hospital setting.    Review of Systems as detailed in  HPI.   Objective: Vital Signs: There were no vitals taken for this visit.  Physical Exam well-developed well-nourished female no acute distress.  Alert and oriented x 3.  Ortho Exam bilateral shoulder exam: Forward flexion to approximately 90 degrees both sides.  Otherwise very limited range of motion.  Specialty Comments:  No specialty comments available.  Imaging: No new imaging   PMFS History: Patient Active Problem List   Diagnosis Date Noted   RUQ pain 06/29/2022   Pancreatic cyst 06/29/2022   Peripheral neuropathy 05/04/2022   Restless leg syndrome 05/04/2022   Hypertriglyceridemia 05/04/2022   Bilateral shoulder pain 05/04/2022   Encounter for general adult medical examination with abnormal findings 05/04/2022   H/O total hysterectomy 04/26/2022   Odynophagia 04/20/2022   Abdominal pain, epigastric 04/20/2022   Nausea without vomiting 06/16/2021   Allergy to alpha-gal 06/16/2021   Orthostatic hypotension 06/16/2021   Hypocortisolemia (HCC) 03/02/2021   Adrenal adenoma, left 03/02/2021   AKI (acute kidney injury) (HCC) 01/04/2021   Intractable nausea and vomiting 01/03/2021   Left-sided weakness 10/19/2020   Cough 02/11/2020   Near syncope 12/10/2019   Brain aneurysm 04/15/2019   Dysphagia 02/27/2018   Encounter for screening colonoscopy 02/27/2018   History of Clostridium difficile infection 02/27/2018   Chronic diarrhea 12/20/2017   Chronic combined systolic and diastolic congestive heart failure (HCC) 06/20/2016   Hypokalemia due to excessive gastrointestinal loss  of potassium    Acute CHF (congestive heart failure) (HCC) 05/16/2016   Acute on chronic systolic CHF (congestive heart failure) (HCC) 05/16/2016   Acute respiratory failure with hypoxia (HCC)    Thrombocytosis 03/26/2016   Cardiomyopathy, ischemic 03/25/2016   Chronic combined systolic and diastolic heart failure (HCC) 03/25/2016   Anxiety state 03/25/2016   Troponin level elevated 03/25/2016    Coronary artery disease involving native coronary artery of native heart 03/25/2016   Normocytic anemia 03/25/2016   SOB (shortness of breath) 03/24/2016   Lightheadedness 03/17/2016   Hypotension 03/17/2016   Tobacco abuse 03/12/2016   NSTEMI (non-ST elevated myocardial infarction) (HCC) 03/11/2016   Atypical chest pain    Essential hypertension 09/06/2015   Hyperlipidemia 09/06/2015   GERD without esophagitis 09/06/2015   Chest pain 09/06/2015   Past Medical History:  Diagnosis Date   AICD (automatic cardioverter/defibrillator) present    Allergic reaction to alpha-gal    Allergy 03/05/2022   Anemia    Anxiety state 03/25/2016   Arthritis    Basal cell carcinoma of forehead    Brain aneurysm    CHF (congestive heart failure) (HCC)    Coronary artery disease    a. 03/11/16 PCI with DES-->Prox/Mid Cx;  b. 03/14/16 PCI with DES x2-->RCA, EF 30-35%.   Depression 06/09/2010   Encounter for general adult medical examination with abnormal findings 05/04/2022   Essential hypertension    GERD (gastroesophageal reflux disease)    HFrEF (heart failure with reduced ejection fraction) (HCC)    a. 10/2016 Echo: EF 35-40%, Gr1 DD, mild focal basal septal hypertrophy, basal inflat, mid inflat, basal antlat AK. Mid infept/inf/antlat, apical lateral sev HK. Mod MR. mildly reduced RV fxn. Mild TR.   History of pneumonia    Hyperlipidemia    IBS (irritable bowel syndrome)    Ischemic cardiomyopathy    a. 10/2016 Echo: EF 35-40%, Gr1 DD.   Mitral regurgitation    Neuromuscular disorder (HCC)    NSTEMI (non-ST elevated myocardial infarction) (HCC) 03/10/2016   Pneumonia 03/2016   Shingles    Squamous cell cancer of skin of nose    Thrombocytosis 03/26/2016   Tobacco abuse    Trichimoniasis    Wears dentures    Wears glasses     Family History  Problem Relation Age of Onset   Stroke Mother    Hypertension Mother    Diabetes Mother    Heart attack Mother    Heart attack Father     Diabetes Father    Hypertension Father    CAD Father    Colon polyps Father 26       pre-cancerous    Stroke Father    Dementia Father    Hyperlipidemia Father    Arthritis Father    COPD Father    Heart disease Father    Breast cancer Maternal Grandmother    Diabetes Maternal Grandmother    Cancer Maternal Grandfather        Tongue and esophageal   Anxiety disorder Daughter    Depression Daughter    Anxiety disorder Daughter    Heart failure Other    Cancer Paternal Grandmother    Colon cancer Neg Hx     Past Surgical History:  Procedure Laterality Date   APPENDECTOMY     BIOPSY  09/20/2018   Procedure: BIOPSY;  Surgeon: Corbin Ade, MD;  Location: [facility];  Service: Endoscopy;;  colon   BIOPSY  01/05/2021   Procedure: BIOPSY;  Surgeon: Rachael Fee, MD;  Location: [facility] ENDOSCOPY;  Service: Endoscopy;;   BIOPSY  04/27/2022   Procedure: BIOPSY;  Surgeon: Lanelle Bal, DO;  Location: [facility];  Service: Endoscopy;;   CARDIAC CATHETERIZATION N/A 03/11/2016   Procedure: Left Heart Cath and Coronary Angiography;  Surgeon: Marykay Lex, MD;  Location: [facility] INVASIVE CV LAB;  Service: Cardiovascular;  Laterality: N/A;   CARDIAC CATHETERIZATION N/A 03/11/2016   Procedure: Coronary Stent Intervention;  Surgeon: Marykay Lex, MD;  Location: [facility] INVASIVE CV LAB;  Service: Cardiovascular;  Laterality: N/A;   CARDIAC CATHETERIZATION N/A 03/14/2016   Procedure: Coronary Stent Intervention;  Surgeon: Peter M Swaziland, MD;  Location: [facility] INVASIVE CV LAB;  Service: Cardiovascular;  Laterality: N/A;   CEREBRAL ANEURYSM REPAIR  04/2019   stent placed   CHOLECYSTECTOMY OPEN  1984   COLONOSCOPY WITH PROPOFOL N/A 09/20/2018   Procedure: COLONOSCOPY WITH PROPOFOL;  Surgeon: Corbin Ade, MD;  Location: [facility];  Service: Endoscopy;  Laterality: N/A;  10:30am   CORONARY ANGIOPLASTY WITH STENT PLACEMENT  03/14/2016   ESOPHAGOGASTRODUODENOSCOPY (EGD) WITH PROPOFOL N/A  09/20/2018   Procedure: ESOPHAGOGASTRODUODENOSCOPY (EGD) WITH PROPOFOL;  Surgeon: Corbin Ade, MD;  Location: [facility];  Service: Endoscopy;  Laterality: N/A;   ESOPHAGOGASTRODUODENOSCOPY (EGD) WITH PROPOFOL N/A 01/05/2021   Procedure: ESOPHAGOGASTRODUODENOSCOPY (EGD) WITH PROPOFOL;  Surgeon: Rachael Fee, MD;  Location: [facility] ENDOSCOPY;  Service: Endoscopy;  Laterality: N/A;   ESOPHAGOGASTRODUODENOSCOPY (EGD) WITH PROPOFOL N/A 04/27/2022   Procedure: ESOPHAGOGASTRODUODENOSCOPY (EGD) WITH PROPOFOL;  Surgeon: Lanelle Bal, DO;  Location: [facility];  Service: Endoscopy;  Laterality: N/A;  9:15am, asa 3/4, ASAP   FINGER ARTHROPLASTY Left 05/14/2013   Procedure: LEFT THUMB CARPAL METACARPAL ARTHROPLASTY;  Surgeon: Tami Ribas, MD;  Location: [facility];  Service: Orthopedics;  Laterality: Left;   ICD IMPLANT N/A 04/03/2020   Procedure: ICD IMPLANT;  Surgeon: Regan Lemming, MD;  Location: [facility] INVASIVE CV LAB;  Service: Cardiovascular;  Laterality: N/A;   IR ANGIO INTRA EXTRACRAN SEL COM CAROTID INNOMINATE BILAT MOD SED  01/05/2017   IR ANGIO INTRA EXTRACRAN SEL COM CAROTID INNOMINATE BILAT MOD SED  03/19/2019   IR ANGIO INTRA EXTRACRAN SEL COM CAROTID INNOMINATE BILAT MOD SED  06/04/2020   IR ANGIO INTRA EXTRACRAN SEL INTERNAL CAROTID UNI L MOD SED  04/15/2019   IR ANGIO VERTEBRAL SEL VERTEBRAL BILAT MOD SED  01/05/2017   IR ANGIO VERTEBRAL SEL VERTEBRAL BILAT MOD SED  03/19/2019   IR ANGIO VERTEBRAL SEL VERTEBRAL UNI L MOD SED  06/04/2020   IR ANGIOGRAM FOLLOW UP STUDY  04/15/2019   IR RADIOLOGIST EVAL & MGMT  12/30/2016   IR TRANSCATH/EMBOLIZ  04/15/2019   IR US GUIDE VASC ACCESS RIGHT  03/19/2019   IR US GUIDE VASC ACCESS RIGHT  06/04/2020   JOINT REPLACEMENT  05-15-2013   MALONEY DILATION N/A 09/20/2018   Procedure: Elease Hashimoto DILATION;  Surgeon: Corbin Ade, MD;  Location: [facility];  Service: Endoscopy;  Laterality: N/A;   RADIOLOGY WITH  ANESTHESIA N/A 04/15/2019   Procedure: Sharman Crate;  Surgeon: Julieanne Cotton, MD;  Location: [facility] OR;  Service: Radiology;  Laterality: N/A;   RIGHT/LEFT HEART CATH AND CORONARY ANGIOGRAPHY N/A 08/19/2019   Procedure: RIGHT/LEFT HEART CATH AND CORONARY ANGIOGRAPHY;  Surgeon: Laurey Morale, MD;  Location: [facility] INVASIVE CV LAB;  Service: Cardiovascular;  Laterality: N/A;   TUBAL LIGATION  1987   VAGINAL HYSTERECTOMY  2009  Social History   Occupational History   Occupation: CNA  Tobacco Use   Smoking status: Every Day    Packs/day: 0.25    Years: 15.00    Additional pack years: 0.00    Total pack years: 3.75    Types: Cigarettes   Smokeless tobacco: Never   Tobacco comments:    smokes a cigarette occasionally  Vaping Use   Vaping Use: Never used  Substance and Sexual Activity   Alcohol use: Yes    Comment: occasionally   Drug use: Not Currently    Types: Marijuana    Comment: former- 2017 last time   Sexual activity: Not Currently    Birth control/protection: Surgical    Comment: hyst

## 2022-10-26 ENCOUNTER — Ambulatory Visit: Payer: 59 | Admitting: Internal Medicine

## 2022-10-30 ENCOUNTER — Other Ambulatory Visit: Payer: Self-pay | Admitting: Internal Medicine

## 2022-10-30 DIAGNOSIS — G6289 Other specified polyneuropathies: Secondary | ICD-10-CM

## 2022-10-31 ENCOUNTER — Ambulatory Visit: Payer: Medicare HMO | Attending: Cardiology

## 2022-10-31 DIAGNOSIS — E782 Mixed hyperlipidemia: Secondary | ICD-10-CM | POA: Diagnosis not present

## 2022-11-01 LAB — LIPID PANEL
Chol/HDL Ratio: 2 ratio (ref 0.0–4.4)
Cholesterol, Total: 103 mg/dL (ref 100–199)
HDL: 52 mg/dL (ref >39)
LDL Chol Calc (NIH): 20 mg/dL (ref 0–99)
Triglycerides: 198 mg/dL — ABNORMAL HIGH (ref 0–149)
VLDL Cholesterol Cal: 31 mg/dL (ref 5–40)

## 2022-11-01 LAB — HEPATIC FUNCTION PANEL
ALT: 21 IU/L (ref 0–32)
AST: 23 IU/L (ref 0–40)
Albumin: 4.4 g/dL (ref 3.8–4.9)
Alkaline Phosphatase: 50 IU/L (ref 44–121)
Bilirubin Total: 0.4 mg/dL (ref 0.0–1.2)
Bilirubin, Direct: 0.17 mg/dL (ref 0.00–0.40)
Total Protein: 6.9 g/dL (ref 6.0–8.5)

## 2022-11-02 ENCOUNTER — Telehealth: Payer: Self-pay | Admitting: Orthopaedic Surgery

## 2022-11-02 NOTE — Telephone Encounter (Signed)
Called pt and unable to leave a vm for mailbox is full. Pt need an MRI Review appt with Dr Roda Shutters after 8/30

## 2022-11-08 ENCOUNTER — Ambulatory Visit (INDEPENDENT_AMBULATORY_CARE_PROVIDER_SITE_OTHER): Payer: Medicare PPO | Admitting: Internal Medicine

## 2022-11-08 ENCOUNTER — Encounter: Payer: Self-pay | Admitting: Internal Medicine

## 2022-11-08 VITALS — BP 104/68 | HR 82 | Temp 98.1°F | Ht 60.0 in | Wt 120.4 lb

## 2022-11-08 DIAGNOSIS — K219 Gastro-esophageal reflux disease without esophagitis: Secondary | ICD-10-CM

## 2022-11-08 DIAGNOSIS — R1319 Other dysphagia: Secondary | ICD-10-CM | POA: Diagnosis not present

## 2022-11-08 NOTE — Progress Notes (Unsigned)
Primary Care Physician:  Billie Lade, MD Primary Gastroenterologist:  Dr. Jena Gauss  Pre-Procedure History & Physical: HPI:  Erin [name] is a 62 y.o. female here for evaluation of a 3-week history of worsening nausea and anorexia.  Dysphagia for a number of months.  EGD last year for odynophagia demonstrated a small esophageal ulcer.  Patent tubular esophagus.  Biopsies nonspecific inflammation.  No evidence of infectious etiology. Has seen her cardiologist recently and felt to be stable from a cardiac standpoint.  She does have a defibrillator.  She is lost 8 pounds since her February visit here.  CT of the abdomen without contrast demonstrated increased stool load consistent with constipation.  Patient does endorse chronic constipation moves bowels fairly infrequently.  Has multiple comorbidities.  She is on multiple medications (see below.  Past Medical History:  Diagnosis Date   AICD (automatic cardioverter/defibrillator) present    Allergic reaction to alpha-gal    Allergy 03/05/2022   Anemia    Anxiety state 03/25/2016   Arthritis    Basal cell carcinoma of forehead    Brain aneurysm    CHF (congestive heart failure) (HCC)    Coronary artery disease    a. 03/11/16 PCI with DES-->Prox/Mid Cx;  b. 03/14/16 PCI with DES x2-->RCA, EF 30-35%.   Depression 06/09/2010   Encounter for general adult medical examination with abnormal findings 05/04/2022   Essential hypertension    GERD (gastroesophageal reflux disease)    HFrEF (heart failure with reduced ejection fraction) (HCC)    a. 10/2016 Echo: EF 35-40%, Gr1 DD, mild focal basal septal hypertrophy, basal inflat, mid inflat, basal antlat AK. Mid infept/inf/antlat, apical lateral sev HK. Mod MR. mildly reduced RV fxn. Mild TR.   History of pneumonia    Hyperlipidemia    IBS (irritable bowel syndrome)    Ischemic cardiomyopathy    a. 10/2016 Echo: EF 35-40%, Gr1 DD.   Mitral regurgitation    Neuromuscular disorder (HCC)     NSTEMI (non-ST elevated myocardial infarction) (HCC) 03/10/2016   Pneumonia 03/2016   Shingles    Squamous cell cancer of skin of nose    Thrombocytosis 03/26/2016   Tobacco abuse    Trichimoniasis    Wears dentures    Wears glasses     Past Surgical History:  Procedure Laterality Date   APPENDECTOMY     BIOPSY  09/20/2018   Procedure: BIOPSY;  Surgeon: Corbin Ade, MD;  Location: [facility];  Service: Endoscopy;;  colon   BIOPSY  01/05/2021   Procedure: BIOPSY;  Surgeon: Rachael Fee, MD;  Location: [facility] ENDOSCOPY;  Service: Endoscopy;;   BIOPSY  04/27/2022   Procedure: BIOPSY;  Surgeon: Lanelle Bal, DO;  Location: [facility];  Service: Endoscopy;;   CARDIAC CATHETERIZATION N/A 03/11/2016   Procedure: Left Heart Cath and Coronary Angiography;  Surgeon: Marykay Lex, MD;  Location: [facility] INVASIVE CV LAB;  Service: Cardiovascular;  Laterality: N/A;   CARDIAC CATHETERIZATION N/A 03/11/2016   Procedure: Coronary Stent Intervention;  Surgeon: Marykay Lex, MD;  Location: [facility] INVASIVE CV LAB;  Service: Cardiovascular;  Laterality: N/A;   CARDIAC CATHETERIZATION N/A 03/14/2016   Procedure: Coronary Stent Intervention;  Surgeon: Peter M Swaziland, MD;  Location: [facility] INVASIVE CV LAB;  Service: Cardiovascular;  Laterality: N/A;   CEREBRAL ANEURYSM REPAIR  04/2019   stent placed   CHOLECYSTECTOMY OPEN  1984   COLONOSCOPY WITH PROPOFOL N/A 09/20/2018   Procedure: COLONOSCOPY WITH PROPOFOL;  Surgeon:  Digby Groeneveld, Gerrit Friends, MD;  Location: [facility];  Service: Endoscopy;  Laterality: N/A;  10:30am   CORONARY ANGIOPLASTY WITH STENT PLACEMENT  03/14/2016   ESOPHAGOGASTRODUODENOSCOPY (EGD) WITH PROPOFOL N/A 09/20/2018   Procedure: ESOPHAGOGASTRODUODENOSCOPY (EGD) WITH PROPOFOL;  Surgeon: Corbin Ade, MD;  [facility]: [facility];  Service: Endoscopy;  Laterality: N/A;   ESOPHAGOGASTRODUODENOSCOPY (EGD) WITH PROPOFOL N/A 01/05/2021   Procedure: ESOPHAGOGASTRODUODENOSCOPY (EGD) WITH  PROPOFOL;  Surgeon: Rachael Fee, MD;  Location: [facility] ENDOSCOPY;  Service: Endoscopy;  Laterality: N/A;   ESOPHAGOGASTRODUODENOSCOPY (EGD) WITH PROPOFOL N/A 04/27/2022   Procedure: ESOPHAGOGASTRODUODENOSCOPY (EGD) WITH PROPOFOL;  Surgeon: Lanelle Bal, DO;  [facility]: [facility];  Service: Endoscopy;  Laterality: N/A;  9:15am, asa 3/4, ASAP   FINGER ARTHROPLASTY Left 05/14/2013   Procedure: LEFT THUMB CARPAL METACARPAL ARTHROPLASTY;  Surgeon: Tami Ribas, MD;  Location: [facility];  Service: Orthopedics;  Laterality: Left;   ICD IMPLANT N/A 04/03/2020   Procedure: ICD IMPLANT;  Surgeon: Regan Lemming, MD;  Location: [facility] INVASIVE CV LAB;  Service: Cardiovascular;  Laterality: N/A;   IR ANGIO INTRA EXTRACRAN SEL COM CAROTID INNOMINATE BILAT MOD SED  01/05/2017   IR ANGIO INTRA EXTRACRAN SEL COM CAROTID INNOMINATE BILAT MOD SED  03/19/2019   IR ANGIO INTRA EXTRACRAN SEL COM CAROTID INNOMINATE BILAT MOD SED  06/04/2020   IR ANGIO INTRA EXTRACRAN SEL INTERNAL CAROTID UNI L MOD SED  04/15/2019   IR ANGIO VERTEBRAL SEL VERTEBRAL BILAT MOD SED  01/05/2017   IR ANGIO VERTEBRAL SEL VERTEBRAL BILAT MOD SED  03/19/2019   IR ANGIO VERTEBRAL SEL VERTEBRAL UNI L MOD SED  06/04/2020   IR ANGIOGRAM FOLLOW UP STUDY  04/15/2019   IR RADIOLOGIST EVAL & MGMT  12/30/2016   IR TRANSCATH/EMBOLIZ  04/15/2019   IR US GUIDE VASC ACCESS RIGHT  03/19/2019   IR US GUIDE VASC ACCESS RIGHT  06/04/2020   JOINT REPLACEMENT  05-15-2013   MALONEY DILATION N/A 09/20/2018   Procedure: Elease Hashimoto DILATION;  Surgeon: Corbin Ade, MD;  Location: [facility];  Service: Endoscopy;  Laterality: N/A;   RADIOLOGY WITH ANESTHESIA N/A 04/15/2019   Procedure: Sharman Crate;  Surgeon: Julieanne Cotton, MD;  Location: [facility] OR;  Service: Radiology;  Laterality: N/A;   RIGHT/LEFT HEART CATH AND CORONARY ANGIOGRAPHY N/A 08/19/2019   Procedure: RIGHT/LEFT HEART CATH AND CORONARY ANGIOGRAPHY;  Surgeon: Laurey Morale, MD;  Location: [facility] INVASIVE CV LAB;  Service: Cardiovascular;  Laterality: N/A;   TUBAL LIGATION  1987   VAGINAL HYSTERECTOMY  2009    Prior to Admission medications   Medication Sig Start Date End Date Taking? Authorizing Provider  aspirin EC 81 MG tablet Take 81 mg by mouth every evening.    Yes [provider]  atorvastatin (LIPITOR) 80 MG tablet Take 1 tablet (80 mg total) by mouth daily. 09/05/22  Yes Laurey Morale, MD  clopidogrel (PLAVIX) 75 MG tablet Take 1 tablet by mouth once daily 07/12/22  Yes Laurey Morale, MD  cyproheptadine (PERIACTIN) 4 MG tablet Take 2 tablets by mouth twice daily 08/17/22  Yes Alfonse Spruce, MD  dapagliflozin propanediol (FARXIGA) 10 MG TABS tablet Take 1 tablet (10 mg total) by mouth daily before breakfast. 09/12/22  Yes Milford, Anderson Malta, FNP  EPINEPHrine (EPIPEN 2-PAK) 0.3 mg/0.3 mL IJ SOAJ injection Inject 0.3 mg into the muscle as needed for anaphylaxis. 04/23/21  Yes Alfonse Spruce, MD  Evolocumab [facility] SURECLICK) 140 MG/ML SOAJ Inject  140 mg into the skin every 14 (fourteen) days. 09/07/22  Yes Laurey Morale, MD  fenofibrate (TRICOR) 145 MG tablet Take 1 tablet (145 mg total) by mouth daily. 07/27/22  Yes Laurey Morale, MD  furosemide (LASIX) 20 MG tablet Take 1 tablet (20 mg total) by mouth daily. 06/13/22  Yes Laurey Morale, MD  gabapentin (NEURONTIN) 300 MG capsule Take 1 capsule by mouth at bedtime 10/31/22  Yes Billie Lade, MD  loperamide (IMODIUM) 2 MG capsule Take 2 mg by mouth 4 (four) times daily as needed for diarrhea or loose stools (ibs).   Yes [provider]  losartan (COZAAR) 25 MG tablet Take 1 tablet (25 mg total) by mouth at bedtime. 09/12/22  Yes Milford, Anderson Malta, FNP  metoprolol succinate (TOPROL XL) 25 MG 24 hr tablet Take 0.5 tablets (12.5 mg total) by mouth daily. 07/27/22  Yes Laurey Morale, MD  Multiple Vitamins-Minerals (MULTIVITAMIN WITH MINERALS) tablet Take 1 tablet by  mouth daily.   Yes [provider]  nitroGLYCERIN (NITROSTAT) 0.4 MG SL tablet Place 1 tablet (0.4 mg total) under the tongue every 5 (five) minutes x 3 doses as needed for chest pain (if no relief after 2nd dose, proceed to the ED for an evaluation or call 911). 09/19/22  Yes Laurey Morale, MD  ondansetron (ZOFRAN) 4 MG tablet Take 1 tablet (4 mg total) by mouth every 6 (six) hours as needed for nausea or vomiting. 06/29/22  Yes Tiffany Kocher, PA-C  pantoprazole (PROTONIX) 40 MG tablet TAKE 1 TABLET BY MOUTH TWICE DAILY BEFORE A MEAL 10/17/22  Yes Gelene Mink, NP  potassium chloride SA (KLOR-CON M) 20 MEQ tablet Take 1 tablet (20 mEq total) by mouth 2 (two) times daily. 07/11/22  Yes Laurey Morale, MD  rOPINIRole (REQUIP) 0.5 MG tablet Take 0.5 mg by mouth at bedtime.  11/13/17  Yes [provider]  spironolactone (ALDACTONE) 25 MG tablet Take 1 tablet (25 mg total) by mouth at bedtime. 03/07/22  Yes Laurey Morale, MD  sucralfate (CARAFATE) 1 GM/10ML suspension Take 10 mLs (1 g total) by mouth 4 (four) times daily. 04/27/22 04/27/23 Yes Carver, Hennie Duos, DO  traMADol (ULTRAM) 50 MG tablet Take 1 tablet (50 mg total) by mouth 3 (three) times daily as needed. 07/27/22  Yes Cristie Hem, PA-C  varenicline (CHANTIX) 1 MG tablet Take 1 tablet (1 mg total) by mouth 2 (two) times daily. 09/12/22  Yes Milford, Anderson Malta, FNP  Varenicline Tartrate, Starter, 0.5 MG X 11 & 1 MG X 42 TBPK Take 0.5 daily on days 1 - 3 Take 0.5 bid on days 4 - 7 Then take 1 mg bid thereafter 09/12/22  Yes Milford, Anderson Malta, FNP  [name] 1 g capsule Take 2 capsules (2 g total) by mouth 2 (two) times daily. 08/18/22  Yes Laurey Morale, MD  Vitamin D, Ergocalciferol, (DRISDOL) 1.25 MG (50000 UNIT) CAPS capsule Take 50,000 Units by mouth once a week. Takes on Mondays. 12/07/21  Yes [provider]    Allergies as of 11/08/2022 - Review Complete 11/08/2022  Allergen Reaction Noted   Alpha-gal  Shortness Of Breath, Nausea And Vomiting, and Dermatitis 02/28/2022   Other Shortness Of Breath, Diarrhea, Nausea And Vomiting, and Nausea Only 03/02/2021   Tape Other (See Comments) 01/03/2017    Family History  Problem Relation Age of Onset   Stroke Mother    Hypertension Mother    Diabetes Mother  Heart attack Mother    Heart attack Father    Diabetes Father    Hypertension Father    CAD Father    Colon polyps Father 17       pre-cancerous    Stroke Father    Dementia Father    Hyperlipidemia Father    Arthritis Father    COPD Father    Heart disease Father    Breast cancer Maternal Grandmother    Diabetes Maternal Grandmother    Cancer Maternal Grandfather        Tongue and esophageal   Anxiety disorder Daughter    Depression Daughter    Anxiety disorder Daughter    Heart failure Other    Cancer Paternal Grandmother    Colon cancer Neg Hx     Social History   Socioeconomic History   Marital status: Married    Spouse name: Not on file   Number of children: Not on file   Years of education: Not on file   Highest education level: Not on file  Occupational History   Occupation: CNA  Tobacco Use   Smoking status: Every Day    Packs/day: 0.25    Years: 15.00    Additional pack years: 0.00    Total pack years: 3.75    Types: Cigarettes   Smokeless tobacco: Never   Tobacco comments:    smokes a cigarette occasionally  Vaping Use   Vaping Use: Never used  Substance and Sexual Activity   Alcohol use: Yes    Comment: occasionally   Drug use: Not Currently    Types: Marijuana    Comment: former- 2017 last time   Sexual activity: Not Currently    Birth control/protection: Surgical    Comment: hyst  Other Topics Concern   Not on file  Social History Narrative   Lives with husband in [place] in a one story home with a basement.  Has 4 children.  Works as a Lawyer.  Education: CNA school.    Social Determinants of Health   Financial Resource Strain: Low Risk   (06/11/2021)   Overall Financial Resource Strain (CARDIA)    Difficulty of Paying Living Expenses: Not hard at all  Food Insecurity: No Food Insecurity (06/11/2021)   Hunger Vital Sign    Worried About Running Out of Food in the Last Year: Never true    Ran Out of Food in the Last Year: Never true  Transportation Needs: No Transportation Needs (06/11/2021)   PRAPARE - Administrator, Civil Service (Medical): No    Lack of Transportation (Non-Medical): No  Physical Activity: Insufficiently Active (06/11/2021)   Exercise Vital Sign    Days of Exercise per Week: 4 days    Minutes of Exercise per Session: 30 min  Stress: Stress Concern Present (06/11/2021)   [employer] of Occupational Health - Occupational Stress Questionnaire    Feeling of Stress : Very much  Social Connections: Socially Isolated (06/11/2021)   Social Connection and Isolation Panel [NHANES]    Frequency of Communication with Friends and Family: Once a week    Frequency of Social Gatherings with Friends and Family: Never    Attends Religious Services: Never    Database administrator or Organizations: No    Attends Banker Meetings: Never    Marital Status: Married  Catering manager Violence: Not At Risk (06/11/2021)   Humiliation, Afraid, Rape, and Kick questionnaire    Fear of Current or Ex-Partner: No  Emotionally Abused: No    Physically Abused: No    Sexually Abused: No    Review of Systems: See HPI, otherwise negative ROS  Physical Exam: BP 104/68 (BP Location: Right Arm, Patient Position: Sitting, Cuff Size: Normal)   Pulse 82   Temp 98.1 F (36.7 C) (Oral)   Ht 5' (1.524 m)   Wt 120 lb 6.4 oz (54.6 kg)   SpO2 92%   BMI 23.51 kg/m  General:   Alert, somewhat frail, chronically ill patient appears older than her stated chronological age.  Pleasant and cooperative in NAD Mouth:  No deformity or lesions. Neck:  Supple; no masses or thyromegaly. No significant cervical  adenopathy. Lungs:  Clear throughout to auscultation.   No wheezes, crackles, or rhonchi. No acute distress. Heart:  Regular rate and rhythm; no murmurs, clicks, rubs,  or gallops. Abdomen: Non-distended, normal bowel sounds.  Soft and nontender without appreciable mass or hepatosplenomegaly.   Impression/Plan: 62 year old lady with multiple comorbidities complaining of continued nausea and anorexia.  Aversion to food.  No sitophobia and no frank abdominal pain.  Now complains of dysphagia.  She is lost 8 pounds since she was last here.  Small ulcer in her esophagus in 2023.  No obstruction.  Biopsies negative.  Constipation may be a contributing factor.  As discussed because you have difficulty getting food through your esophagus, we will schedule an EGD with possible esophageal dilation (dysphagia) -  (ASA 3)  Continue Protonix twice daily for now.  Samples of Linzess 145 gelcap 1 daily for the next 3 weeks to combat constipation.  Let us know how Linzess is working for you and we can call a prescription and if it is helping.  Hold diabetes medication for procedure per protocol.  Further recommendations to follow.   Notice: This dictation was prepared with Dragon dictation along with smaller phrase technology. Any transcriptional errors that result from this process are unintentional and may not be corrected upon review.

## 2022-11-08 NOTE — Patient Instructions (Addendum)
It was good to see you again today!  As discussed because you have difficulty getting food through your esophagus, we will schedule an EGD with possible esophageal dilation (dysphagia) -  (ASA 3)  Continue Protonix twice daily for now.  Samples of Linzess 145 gelcap 1 daily for the next 3 weeks to combat constipation.  Let us know how Linzess is working for you and we can call a prescription and if it is helping.  Hold diabetes medication for procedure per protocol.  Further recommendations to follow.

## 2022-11-16 ENCOUNTER — Ambulatory Visit (INDEPENDENT_AMBULATORY_CARE_PROVIDER_SITE_OTHER): Payer: Medicare PPO

## 2022-11-16 DIAGNOSIS — I255 Ischemic cardiomyopathy: Secondary | ICD-10-CM

## 2022-11-16 LAB — CUP PACEART REMOTE DEVICE CHECK
Battery Remaining Longevity: 120 mo
Battery Voltage: 3.01 V
Brady Statistic RV Percent Paced: 0 %
Date Time Interrogation Session: 20240710073827
HighPow Impedance: 91 Ohm
Implantable Lead Connection Status: 753985
Implantable Lead Implant Date: 20211126
Implantable Lead Location: 753860
Implantable Pulse Generator Implant Date: 20211126
Lead Channel Impedance Value: 494 Ohm
Lead Channel Impedance Value: 627 Ohm
Lead Channel Pacing Threshold Amplitude: 0.625 V
Lead Channel Pacing Threshold Pulse Width: 0.4 ms
Lead Channel Sensing Intrinsic Amplitude: 4.75 mV
Lead Channel Sensing Intrinsic Amplitude: 4.75 mV
Lead Channel Setting Pacing Amplitude: 2 V
Lead Channel Setting Pacing Pulse Width: 0.4 ms
Lead Channel Setting Sensing Sensitivity: 0.3 mV
Zone Setting Status: 755011
Zone Setting Status: 755011

## 2022-11-18 ENCOUNTER — Encounter (HOSPITAL_COMMUNITY): Payer: Self-pay | Admitting: Cardiology

## 2022-11-21 ENCOUNTER — Encounter: Payer: Self-pay | Admitting: Internal Medicine

## 2022-11-21 ENCOUNTER — Other Ambulatory Visit: Payer: Self-pay | Admitting: Internal Medicine

## 2022-11-21 DIAGNOSIS — G6289 Other specified polyneuropathies: Secondary | ICD-10-CM

## 2022-11-22 ENCOUNTER — Ambulatory Visit: Payer: Medicare HMO | Admitting: Internal Medicine

## 2022-11-22 ENCOUNTER — Other Ambulatory Visit: Payer: Self-pay

## 2022-11-22 ENCOUNTER — Telehealth: Payer: Self-pay | Admitting: *Deleted

## 2022-11-22 MED ORDER — LINACLOTIDE 145 MCG PO CAPS: 145.0000 ug | ORAL_CAPSULE | Freq: Every day | ORAL | 11 refills | Status: AC

## 2022-11-22 NOTE — Telephone Encounter (Signed)
Tried too call pt to schedule EGD/ED with Dr. Jena Gauss, ASA 3 but VM full. Will need to hold farxiga x 3 days prior, clears x 24 hrs.

## 2022-11-23 ENCOUNTER — Other Ambulatory Visit: Payer: Self-pay

## 2022-11-23 ENCOUNTER — Telehealth: Payer: Self-pay

## 2022-11-23 DIAGNOSIS — R1319 Other dysphagia: Secondary | ICD-10-CM

## 2022-11-23 DIAGNOSIS — K219 Gastro-esophageal reflux disease without esophagitis: Secondary | ICD-10-CM

## 2022-11-23 MED ORDER — ONDANSETRON HCL 4 MG PO TABS
4.0000 mg | ORAL_TABLET | Freq: Four times a day (QID) | ORAL | 5 refills | Status: DC | PRN
Start: 2022-11-23 — End: 2023-07-11

## 2022-11-23 NOTE — Telephone Encounter (Signed)
Noted  Sent pt refills to her pharmacy in [place] and sent  the pt a MyChart message advising of this

## 2022-11-23 NOTE — Telephone Encounter (Signed)
PA approved via cohere. Auth# [id number], DOS:  01/04/2023 - 03/06/2023

## 2022-11-23 NOTE — Telephone Encounter (Signed)
Pt's last ov was 11/08/2022. Pt requests refill on Ondansetron 4 mg tab. Please advise

## 2022-11-24 ENCOUNTER — Encounter: Payer: Self-pay | Admitting: *Deleted

## 2022-11-28 ENCOUNTER — Other Ambulatory Visit: Payer: Self-pay | Admitting: Gastroenterology

## 2022-11-28 MED ORDER — PROMETHAZINE HCL 12.5 MG PO TABS: ORAL_TABLET | ORAL | 0 refills | Status: DC

## 2022-11-29 NOTE — Telephone Encounter (Signed)
Phoned and spoke with the pt advising of the Rx being sent in along with instructions to both medications. Pt expressed understanding

## 2022-12-06 ENCOUNTER — Other Ambulatory Visit: Payer: Self-pay | Admitting: *Deleted

## 2022-12-06 ENCOUNTER — Encounter: Payer: Self-pay | Admitting: *Deleted

## 2022-12-06 DIAGNOSIS — K862 Cyst of pancreas: Secondary | ICD-10-CM

## 2022-12-07 NOTE — Progress Notes (Signed)
Remote ICD transmission.   

## 2022-12-11 ENCOUNTER — Other Ambulatory Visit: Payer: Self-pay | Admitting: Physician Assistant

## 2022-12-16 ENCOUNTER — Other Ambulatory Visit (HOSPITAL_COMMUNITY): Payer: Medicare PPO

## 2022-12-19 ENCOUNTER — Ambulatory Visit (HOSPITAL_COMMUNITY)
Admission: RE | Admit: 2022-12-19 | Discharge: 2022-12-19 | Disposition: A | Discharge status: Home / Self Care | Payer: Medicare PPO | Source: Ambulatory Visit | Attending: Cardiology | Admitting: Cardiology

## 2022-12-19 ENCOUNTER — Encounter (HOSPITAL_COMMUNITY): Payer: Self-pay | Admitting: Cardiology

## 2022-12-19 VITALS — BP 116/64 | HR 80 | Wt 118.8 lb

## 2022-12-19 DIAGNOSIS — I5022 Chronic systolic (congestive) heart failure: Secondary | ICD-10-CM | POA: Insufficient documentation

## 2022-12-19 DIAGNOSIS — Z7902 Long term (current) use of antithrombotics/antiplatelets: Secondary | ICD-10-CM | POA: Diagnosis not present

## 2022-12-19 DIAGNOSIS — I2510 Atherosclerotic heart disease of native coronary artery without angina pectoris: Secondary | ICD-10-CM | POA: Diagnosis present

## 2022-12-19 DIAGNOSIS — F17210 Nicotine dependence, cigarettes, uncomplicated: Secondary | ICD-10-CM | POA: Diagnosis not present

## 2022-12-19 DIAGNOSIS — I6529 Occlusion and stenosis of unspecified carotid artery: Secondary | ICD-10-CM

## 2022-12-19 DIAGNOSIS — I5042 Chronic combined systolic (congestive) and diastolic (congestive) heart failure: Secondary | ICD-10-CM | POA: Diagnosis present

## 2022-12-19 DIAGNOSIS — I252 Old myocardial infarction: Secondary | ICD-10-CM | POA: Insufficient documentation

## 2022-12-19 DIAGNOSIS — I6523 Occlusion and stenosis of bilateral carotid arteries: Secondary | ICD-10-CM | POA: Insufficient documentation

## 2022-12-19 LAB — BASIC METABOLIC PANEL
Anion gap: 9 (ref 5–15)
BUN: 11 mg/dL (ref 6–20)
CO2: 24 mmol/L (ref 22–32)
Calcium: 9.5 mg/dL (ref 8.9–10.3)
Chloride: 109 mmol/L (ref 98–111)
Creatinine, Ser: 1.27 mg/dL — ABNORMAL HIGH (ref 0.44–1.00)
GFR, Estimated: 48 mL/min — ABNORMAL LOW (ref >60)
Glucose, Bld: 97 mg/dL (ref 70–99)
Potassium: 4.2 mmol/L (ref 3.5–5.1)
Sodium: 142 mmol/L (ref 135–145)

## 2022-12-19 LAB — BRAIN NATRIURETIC PEPTIDE: B Natriuretic Peptide: 170.4 pg/mL — ABNORMAL HIGH (ref 0.0–100.0)

## 2022-12-19 MED ORDER — METOPROLOL SUCCINATE ER 25 MG PO TB24: 25.0000 mg | ORAL_TABLET | Freq: Every day | ORAL | 3 refills | Status: DC

## 2022-12-19 NOTE — Progress Notes (Signed)
PCP: Billie Lade, MD HF Cardiology: Dr. Shirlee Latch  62 y.o. with history of CAD, ischemic cardiomyopathy, and PAD was referred by Dr. Darl Householder for evaluation of CHF.  She had NSTEMI in 11/17 with PCI to prox/mid LCx and RCA.  Subsequently, has developed ischemic cardiomyopathy.  Most recent echo in 1/21 showed EF 30-35%.  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   RHC/LHC done with exertional chest heaviness and dyspnea in 4/21 showed normal filling pressures, preserved cardiac output, and nonobstructive mild CAD.  ABIs were normal in 4/21.   In 8/21, she had syncope thought to be related to orthostasis from low BP in the setting of cardiac medication titration.  Entresto was decreased.   She was in the ER in 11/21 with chest pain.  Troponin and COVID-19 negative. Echo in 11/21 showed EF 30-35%, normal RV.   She was hospitalized in 6/22 with left-sided weakness/numbness.  No evidence for acute CVA, ?TIA.    Echo in 11/22 showed EF 30-35% with mild LV dilation, normal RV, mild-moderate MR, IVC normal.   Echo 2/24 showed EF remains 30-35% with normal RV.   Today she returns for HF follow up. She had quit smoking with Chantix but restarted recently due to stress of grand-daughter's illness.  No dyspnea with normal activities.  She does get mildly short of breath by the end of her 2 mile walk (several times/week for exercise).  No orthopnea/PND.  No lightheadedness.  No chest pain.  She is working part-time as a Conservation officer, nature at [employer]. [employer] down 11 lbs.  She has been having trouble with poor appetite and nausea again, GI planning for EGD.   ECG (personally reviewed): NSR, normal  Medtronic device interrogation (personally reviewed): No VT or AF, thoracic impedance trending down the last few days but fluid index still < threshold.   Labs (2/21): K 3.7, creatinine 0.82 Labs (4/21): K 4.3, creatinine 0.81, LDL 67, TGs 286 Labs (6/21): K 4.7,  creatinine 1.22 Labs (11/21): K 3.7, creatinine 0.84, hgb 12.6, LDL 67, HDL 48, TGs 286 Labs (1/22). K 4.4, creatinine 1.03 Labs (6/22): K 3.5, creatinine 1.0 Labs (11/23): K 3.1, creatinine 0.97 Labs (12/23): K 3.8, creatinine 0.97 Labs (3/24): K 3.9, creatinine 1.09, LDL 78 Labs (5/24): K 3.2, creatinine 1.11, BNP 111 Labs (6/24): LDL 20, TGs 198  PMH: 1. CAD: NSTEMI with DES to proximal and mid LCx in 11/17, staged DES x 2 to RCA later in 11/17.  - Cardiolite (10/20): EF < 30%, large inferior and inferolateral MI with mild peri-infarct ischemia.  - LHC (4/21): Patent stents, nonobstructive CAD.  2. Chronic systolic CHF: Ischemic cardiomyopathy. Medtronic ICD.  - Echo (10/20): EF 35-40%, lateral WMAs, normal RV. - Echo (1/21): EF 30-35%, mild LVH, normal RV - RHC (4/21): mean RA 5, PA 29/3, mean PCWP 12, CI 3.04 - Echo (11/21): EF 30-35%, normal RV.  - Echo (11/22): EF 30-35% with mild LV dilation, normal RV, mild-moderate MR, IVC normal.  - Echo (2/24): EF 30-35%, normal RV.  3. 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  4. Carotid stenosis: Carotid dopplers (10/20) with 60-79% LICA stenosis.  - Carotid dopplers (6/21): 40-59% BICA stenosis.  - Carotid dopplers (1/22): 40-59% RICA, 60-79% LICA.  - Carotid dopplers (4/23): 40-59% LICA - Carotid dopplers (12/23): 40-59% LICA 5. Prior smoker.  6. PAD: ABIs (4/21) Normal.  - Peripheral artery dopplers (12/21): bilateral plaque without focal stenosis.   Social History  Socioeconomic History   Marital status: Married    Spouse name: Not on file   Number of children: Not on file   Years of education: Not on file   Highest education level: Not on file  Occupational History   Occupation: CNA  Tobacco Use   Smoking status: Every Day    Current packs/day: 0.25    Average packs/day: 0.3 packs/day for 15.0 years (3.8 ttl pk-yrs)    Types: Cigarettes   Smokeless tobacco: Never   Tobacco comments:     smokes a cigarette occasionally  Vaping Use   Vaping status: Never Used  Substance and Sexual Activity   Alcohol use: Yes    Comment: occasionally   Drug use: Not Currently    Types: Marijuana    Comment: former- 2017 last time   Sexual activity: Not Currently    Birth control/protection: Surgical    Comment: hyst  Other Topics Concern   Not on file  Social History Narrative   Lives with husband in [place] in a one story home with a basement.  Has 4 children.  Works as a Lawyer.  Education: CNA school.    Social Determinants of Health   Financial Resource Strain: Low Risk  (06/11/2021)   Overall Financial Resource Strain (CARDIA)    Difficulty of Paying Living Expenses: Not hard at all  Food Insecurity: No Food Insecurity (06/11/2021)   Hunger Vital Sign    Worried About Running Out of Food in the Last Year: Never true    Ran Out of Food in the Last Year: Never true  Transportation Needs: No Transportation Needs (06/11/2021)   PRAPARE - Administrator, Civil Service (Medical): No    Lack of Transportation (Non-Medical): No  Physical Activity: Insufficiently Active (06/11/2021)   Exercise Vital Sign    Days of Exercise per Week: 4 days    Minutes of Exercise per Session: 30 min  Stress: Stress Concern Present (06/11/2021)   [employer] of Occupational Health - Occupational Stress Questionnaire    Feeling of Stress : Very much  Social Connections: Socially Isolated (06/11/2021)   Social Connection and Isolation Panel [NHANES]    Frequency of Communication with Friends and Family: Once a week    Frequency of Social Gatherings with Friends and Family: Never    Attends Religious Services: Never    Database administrator or Organizations: No    Attends Banker Meetings: Never    Marital Status: Married  Catering manager Violence: Not At Risk (06/11/2021)   Humiliation, Afraid, Rape, and Kick questionnaire    Fear of Current or Ex-Partner: No    Emotionally  Abused: No    Physically Abused: No    Sexually Abused: No   Family History  Problem Relation Age of Onset   Stroke Mother    Hypertension Mother    Diabetes Mother    Heart attack Mother    Heart attack Father    Diabetes Father    Hypertension Father    CAD Father    Colon polyps Father 78       pre-cancerous    Stroke Father    Dementia Father    Hyperlipidemia Father    Arthritis Father    COPD Father    Heart disease Father    Breast cancer Maternal Grandmother    Diabetes Maternal Grandmother    Cancer Maternal Grandfather        Tongue and esophageal   Anxiety  disorder Daughter    Depression Daughter    Anxiety disorder Daughter    Heart failure Other    Cancer Paternal Grandmother    Colon cancer Neg Hx    ROS: All systems reviewed and negative except as per HPI.   Current Outpatient Medications  Medication Sig Dispense Refill   aspirin EC 81 MG tablet Take 81 mg by mouth every evening.      atorvastatin (LIPITOR) 80 MG tablet Take 1 tablet (80 mg total) by mouth daily. 90 tablet 3   clopidogrel (PLAVIX) 75 MG tablet Take 1 tablet by mouth once daily 30 tablet 11   cyproheptadine (PERIACTIN) 4 MG tablet Take 2 tablets by mouth twice daily 60 tablet 0   dapagliflozin propanediol (FARXIGA) 10 MG TABS tablet Take 1 tablet (10 mg total) by mouth daily before breakfast. 30 tablet 11   EPINEPHrine (EPIPEN 2-PAK) 0.3 mg/0.3 mL IJ SOAJ injection Inject 0.3 mg into the muscle as needed for anaphylaxis. 2 each 2   Evolocumab (REPATHA SURECLICK) 140 MG/ML SOAJ Inject 140 mg into the skin every 14 (fourteen) days. 6 mL 3   fenofibrate (TRICOR) 145 MG tablet Take 1 tablet (145 mg total) by mouth daily. 90 tablet 3   furosemide (LASIX) 20 MG tablet Take 1 tablet (20 mg total) by mouth daily. 90 tablet 3   gabapentin (NEURONTIN) 300 MG capsule Take 1 capsule by mouth at bedtime 30 capsule 0   linaclotide (LINZESS) 145 MCG CAPS capsule Take 1 capsule (145 mcg total) by mouth  daily before breakfast. 30 capsule 11   loperamide (IMODIUM) 2 MG capsule Take 2 mg by mouth 4 (four) times daily as needed for diarrhea or loose stools (ibs).     losartan (COZAAR) 25 MG tablet Take 1 tablet (25 mg total) by mouth at bedtime. 90 tablet 3   Multiple Vitamins-Minerals (MULTIVITAMIN WITH MINERALS) tablet Take 1 tablet by mouth daily.     nitroGLYCERIN (NITROSTAT) 0.4 MG SL tablet Place 1 tablet (0.4 mg total) under the tongue every 5 (five) minutes x 3 doses as needed for chest pain (if no relief after 2nd dose, proceed to the ED for an evaluation or call 911). 25 tablet 2   ondansetron (ZOFRAN) 4 MG tablet Take 1 tablet (4 mg total) by mouth every 6 (six) hours as needed for nausea or vomiting. 60 tablet 5   pantoprazole (PROTONIX) 40 MG tablet TAKE 1 TABLET BY MOUTH TWICE DAILY BEFORE A MEAL 60 tablet 3   potassium chloride SA (KLOR-CON M) 20 MEQ tablet Take 1 tablet (20 mEq total) by mouth 2 (two) times daily. 60 tablet 6   rOPINIRole (REQUIP) 0.5 MG tablet Take 0.5 mg by mouth at bedtime.   0   spironolactone (ALDACTONE) 25 MG tablet Take 1 tablet (25 mg total) by mouth at bedtime. 90 tablet 3   sucralfate (CARAFATE) 1 GM/10ML suspension Take 10 mLs (1 g total) by mouth 4 (four) times daily. 420 mL 1   traMADol (ULTRAM) 50 MG tablet Take 1 tablet (50 mg total) by mouth 3 (three) times daily as needed. 30 tablet 2   varenicline (CHANTIX) 1 MG tablet Take 1 tablet (1 mg total) by mouth 2 (two) times daily. 60 tablet 3   Varenicline Tartrate, Starter, 0.5 MG X 11 & 1 MG X 42 TBPK Take 0.5 daily on days 1 - 3 Take 0.5 bid on days 4 - 7 Then take 1 mg bid thereafter 60 each 0  [name] 1 g capsule Take 2 capsules (2 g total) by mouth 2 (two) times daily. 120 capsule 11   Vitamin D, Ergocalciferol, (DRISDOL) 1.25 MG (50000 UNIT) CAPS capsule Take 50,000 Units by mouth once a week. Takes on Mondays.     metoprolol succinate (TOPROL XL) 25 MG 24 hr tablet Take 1 tablet (25 mg total) by  mouth daily. 90 tablet 3   No current facility-administered medications for this encounter.   Wt Readings from Last 3 Encounters:  12/19/22 53.9 kg (118 lb 12.8 oz)  11/08/22 54.6 kg (120 lb 6.4 oz)  09/29/22 59.9 kg (132 lb)   BP 116/64   Pulse 80   Wt 53.9 kg (118 lb 12.8 oz)   SpO2 100%   BMI 23.20 kg/m  General: NAD Neck: No JVD, no thyromegaly or thyroid nodule.  Lungs: Clear to auscultation bilaterally with normal respiratory effort. CV: Nondisplaced PMI.  Heart regular S1/S2, no S3/S4, no murmur.  No peripheral edema.  No carotid bruit.  Normal pedal pulses.  Abdomen: Soft, nontender, no hepatosplenomegaly, no distention.  Skin: Intact without lesions or rashes.  Neurologic: Alert and oriented x 3.  Psych: Normal affect. Extremities: No clubbing or cyanosis.  HEENT: Normal.   Assessment/Plan: 1. CAD: S/p NSTEMI in 11/17 with DEX to LCx and DES x 2 to RCA.  Cardiolite in 10/20 with EF <30%, inferior/inferolateral infarct with peri-infarct ischemia. LHC in 4/21 showed nonobstructive mild CAD.  No chest pain.  - Continue atorvastatin + Repatha. Good lipids in 6/24.  - Continue ASA 81 and Plavix 75 mg daily for now.  She will continue Plavix as long as Dr. Corliss Skains feels it is needed post her cranial circulation intervention.  2. Chronic systolic CHF: Ischemic cardiomyopathy.  RHC in 4/21 with normal filling pressures and preserved cardiac output.  Echo 2/24 showed stable EF 30-35%. NYHA class II symptoms.  She is mildly volume overloaded by OptiVol though not evident on exam (suspect early volume overload).  - Continue Farixga 10 mg daily  - Continue losartan 25 mg qhs (low BP on Entresto, failed x 2). - Increase Lasix to 40 mg daily x 3 days then back to 20 mg daily. BMET/BNP today.  - Increase Toprol XL to 25 mg daily.  - Continue spironolactone 25 mg daily.   3. Carotid stenosis: Repeat carotids in 12/24.  4. Smoking: She quit then restarted. Trying to quit again. -  Continue Chantix. 5. 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  - Continue Plavix per Dr. Corliss Skains.   Follow up in 3 months with APP  Marca Ancona  12/19/2022

## 2022-12-19 NOTE — Patient Instructions (Signed)
Medication Changes:  Increase Metoprolol XL to 25 mg (1 tab) Daily  Increase Furosemide to 40 mg (2 tabs) Daily FOR 3 DAYS ONLY, then back to 20 mg Daily  Lab Work:  Labs done today, your results will be available in MyChart, we will contact you for abnormal readings.  Testing/Procedures:  Your physician has requested that you have a carotid duplex. This test is an ultrasound of the carotid arteries in your neck. It looks at blood flow through these arteries that supply the brain with blood. Allow one hour for this exam. There are no restrictions or special instructions. IN [place], YOU WILL BE CALLED TO SCHEDULE THIS  Referrals:  none  Special Instructions // Education:  Do the following things EVERYDAY: Weigh yourself in the morning before breakfast. Write it down and keep it in a log. Take your medicines as prescribed Eat low salt foods--Limit salt (sodium) to 2000 mg per day.  Stay as active as you can everyday Limit all fluids for the day to less than 2 liters   Follow-Up in: 3 months  At the [facility], you and your health needs are our priority. We have a designated team specialized in the treatment of Heart Failure. This Care Team includes your primary Heart Failure Specialized Cardiologist (physician), Advanced Practice Providers (APPs- Physician Assistants and Nurse Practitioners), and Pharmacist who all work together to provide you with the care you need, when you need it.   You may see any of the following providers on your designated Care Team at your next follow up:  Dr. Arvilla Meres Dr. Marca Ancona Dr. Marcos Eke, NP Robbie Lis, [place] [facility] [place], [place] Brynda Peon, NP Karle Plumber, PharmD   Please be sure to bring in all your medications bottles to every appointment.   Need to Contact us:  If you have any questions or concerns before your next appointment please send [place] a message through  [place] or call our office at [phone].    TO LEAVE A MESSAGE FOR THE NURSE SELECT OPTION 2, PLEASE LEAVE A MESSAGE INCLUDING: YOUR NAME DATE OF BIRTH CALL BACK NUMBER REASON FOR CALL**this is important as we prioritize the call backs  YOU WILL RECEIVE A CALL BACK THE SAME DAY AS LONG AS YOU CALL BEFORE 4:00 PM

## 2022-12-22 ENCOUNTER — Telehealth (HOSPITAL_COMMUNITY): Payer: Self-pay

## 2022-12-22 NOTE — Telephone Encounter (Signed)
Called to schedule US carotid, no answer, vm full. AB

## 2023-01-03 ENCOUNTER — Encounter (HOSPITAL_COMMUNITY)
Admission: RE | Admit: 2023-01-03 | Discharge: 2023-01-03 | Disposition: A | Discharge status: Home / Self Care | Payer: Medicare PPO | Source: Ambulatory Visit | Attending: Internal Medicine | Admitting: Internal Medicine

## 2023-01-04 ENCOUNTER — Ambulatory Visit (HOSPITAL_COMMUNITY): Payer: Medicare PPO | Admitting: Anesthesiology

## 2023-01-04 ENCOUNTER — Encounter (HOSPITAL_COMMUNITY)
Admission: RE | Disposition: A | Discharge status: Home / Self Care | Payer: Self-pay | Source: Home / Self Care | Attending: Internal Medicine

## 2023-01-04 ENCOUNTER — Encounter (HOSPITAL_COMMUNITY): Payer: Self-pay

## 2023-01-04 ENCOUNTER — Ambulatory Visit (HOSPITAL_BASED_OUTPATIENT_CLINIC_OR_DEPARTMENT_OTHER): Payer: Medicare PPO | Admitting: Anesthesiology

## 2023-01-04 ENCOUNTER — Other Ambulatory Visit: Payer: Self-pay

## 2023-01-04 ENCOUNTER — Encounter (HOSPITAL_COMMUNITY): Payer: Self-pay | Admitting: Internal Medicine

## 2023-01-04 ENCOUNTER — Ambulatory Visit (HOSPITAL_COMMUNITY)
Admission: RE | Admit: 2023-01-04 | Discharge: 2023-01-04 | Disposition: A | Discharge status: Home / Self Care | Payer: Medicare PPO | Attending: Internal Medicine | Admitting: Internal Medicine

## 2023-01-04 DIAGNOSIS — R1310 Dysphagia, unspecified: Secondary | ICD-10-CM

## 2023-01-04 DIAGNOSIS — I110 Hypertensive heart disease with heart failure: Secondary | ICD-10-CM | POA: Diagnosis not present

## 2023-01-04 DIAGNOSIS — I5023 Acute on chronic systolic (congestive) heart failure: Secondary | ICD-10-CM

## 2023-01-04 DIAGNOSIS — I252 Old myocardial infarction: Secondary | ICD-10-CM | POA: Diagnosis not present

## 2023-01-04 DIAGNOSIS — Z95810 Presence of automatic (implantable) cardiac defibrillator: Secondary | ICD-10-CM | POA: Diagnosis not present

## 2023-01-04 DIAGNOSIS — I509 Heart failure, unspecified: Secondary | ICD-10-CM | POA: Diagnosis not present

## 2023-01-04 DIAGNOSIS — K219 Gastro-esophageal reflux disease without esophagitis: Secondary | ICD-10-CM | POA: Insufficient documentation

## 2023-01-04 DIAGNOSIS — R1314 Dysphagia, pharyngoesophageal phase: Secondary | ICD-10-CM | POA: Insufficient documentation

## 2023-01-04 DIAGNOSIS — I2510 Atherosclerotic heart disease of native coronary artery without angina pectoris: Secondary | ICD-10-CM | POA: Diagnosis not present

## 2023-01-04 DIAGNOSIS — F17210 Nicotine dependence, cigarettes, uncomplicated: Secondary | ICD-10-CM | POA: Insufficient documentation

## 2023-01-04 HISTORY — PX: ESOPHAGOGASTRODUODENOSCOPY (EGD) WITH PROPOFOL: SHX5813

## 2023-01-04 HISTORY — PX: MALONEY DILATION: SHX5535

## 2023-01-04 SURGERY — ESOPHAGOGASTRODUODENOSCOPY (EGD) WITH PROPOFOL
Anesthesia: General

## 2023-01-04 MED ORDER — PROPOFOL 10 MG/ML IV BOLUS
INTRAVENOUS | Status: DC | PRN
  Administered 2023-01-04: 80 mg via INTRAVENOUS

## 2023-01-04 MED ORDER — PHENYLEPHRINE 80 MCG/ML (10ML) SYRINGE FOR IV PUSH (FOR BLOOD PRESSURE SUPPORT)
PREFILLED_SYRINGE | INTRAVENOUS | Status: AC
  Filled 2023-01-04: qty 10

## 2023-01-04 MED ORDER — LIDOCAINE HCL (CARDIAC) PF 100 MG/5ML IV SOSY
PREFILLED_SYRINGE | INTRAVENOUS | Status: DC | PRN
  Administered 2023-01-04: 50 mg via INTRAVENOUS

## 2023-01-04 MED ORDER — LACTATED RINGERS IV SOLN: INTRAVENOUS | Status: DC | PRN

## 2023-01-04 MED ORDER — PROPOFOL 500 MG/50ML IV EMUL
INTRAVENOUS | Status: DC | PRN
  Administered 2023-01-04: 150 ug/kg/min via INTRAVENOUS

## 2023-01-04 MED ORDER — PHENYLEPHRINE 80 MCG/ML (10ML) SYRINGE FOR IV PUSH (FOR BLOOD PRESSURE SUPPORT)
PREFILLED_SYRINGE | INTRAVENOUS | Status: DC | PRN
  Administered 2023-01-04 (×2): 160 ug via INTRAVENOUS
  Administered 2023-01-04: 200 ug via INTRAVENOUS

## 2023-01-04 NOTE — Anesthesia Procedure Notes (Signed)
Date/Time: 01/04/2023 1:27 PM  Performed by: Julian Reil, CRNAPre-anesthesia Checklist: Patient identified, Emergency Drugs available, Suction available and Patient being monitored Patient Re-evaluated:Patient Re-evaluated prior to induction Oxygen Delivery Method: Nasal cannula Induction Type: IV induction Placement Confirmation: positive ETCO2

## 2023-01-04 NOTE — Transfer of Care (Signed)
Immediate Anesthesia Transfer of Care Note  Patient: Erin [name]  Procedure(s) Performed: ESOPHAGOGASTRODUODENOSCOPY (EGD) WITH PROPOFOL MALONEY DILATION  Patient Location: Short Stay  Anesthesia Type:General  Level of Consciousness: drowsy  Airway & Oxygen Therapy: Patient Spontanous Breathing  Post-op Assessment: Report given to RN and Post -op Vital signs reviewed and stable  Post vital signs: Reviewed and stable  Last Vitals:  Vitals Value Taken Time  BP    Temp    Pulse    Resp    SpO2      Last Pain:  Vitals:   01/04/23 1323  TempSrc:   PainSc: 0-No pain      Patients Stated Pain Goal: 3 (01/04/23 1220)  Complications: No notable events documented.

## 2023-01-04 NOTE — H&P (Signed)
@LOGO @   Primary Care Physician:  Billie Lade, MD Primary Gastroenterologist:  Dr. Jena Gauss  Pre-Procedure History & Physical: HPI:  Erin [name] is a 62 y.o. female here for   Further evaluation epigastric pain esophageal dysphagia.   Here for further evaluation via EGD.  Esophageal dilation as feasible/appropriate per plan.  Past Medical History:  Diagnosis Date   AICD (automatic cardioverter/defibrillator) present    Allergic reaction to alpha-gal    Allergy 03/05/2022   Anemia    Anxiety state 03/25/2016   Arthritis    Basal cell carcinoma of forehead    Brain aneurysm    CHF (congestive heart failure) (HCC)    Coronary artery disease    a. 03/11/16 PCI with DES-->Prox/Mid Cx;  b. 03/14/16 PCI with DES x2-->RCA, EF 30-35%.   Depression 06/09/2010   Encounter for general adult medical examination with abnormal findings 05/04/2022   Essential hypertension    GERD (gastroesophageal reflux disease)    HFrEF (heart failure with reduced ejection fraction) (HCC)    a. 10/2016 Echo: EF 35-40%, Gr1 DD, mild focal basal septal hypertrophy, basal inflat, mid inflat, basal antlat AK. Mid infept/inf/antlat, apical lateral sev HK. Mod MR. mildly reduced RV fxn. Mild TR.   History of pneumonia    Hyperlipidemia    IBS (irritable bowel syndrome)    Ischemic cardiomyopathy    a. 10/2016 Echo: EF 35-40%, Gr1 DD.   Mitral regurgitation    Neuromuscular disorder (HCC)    NSTEMI (non-ST elevated myocardial infarction) (HCC) 03/10/2016   Pneumonia 03/2016   Shingles    Squamous cell cancer of skin of nose    Thrombocytosis 03/26/2016   Tobacco abuse    Trichimoniasis    Wears dentures    Wears glasses     Past Surgical History:  Procedure Laterality Date   APPENDECTOMY     BIOPSY  09/20/2018   Procedure: BIOPSY;  Surgeon: Corbin Ade, MD;  [facility]: [facility];  Service: Endoscopy;;  colon   BIOPSY  01/05/2021   Procedure: BIOPSY;  Surgeon: Rachael Fee, MD;  Location:  [facility] ENDOSCOPY;  Service: Endoscopy;;   BIOPSY  04/27/2022   Procedure: BIOPSY;  Surgeon: Lanelle Bal, DO;  Location: [facility];  Service: Endoscopy;;   CARDIAC CATHETERIZATION N/A 03/11/2016   Procedure: Left Heart Cath and Coronary Angiography;  Surgeon: Marykay Lex, MD;  Location: [facility] INVASIVE CV LAB;  Service: Cardiovascular;  Laterality: N/A;   CARDIAC CATHETERIZATION N/A 03/11/2016   Procedure: Coronary Stent Intervention;  Surgeon: Marykay Lex, MD;  Location: [facility] INVASIVE CV LAB;  Service: Cardiovascular;  Laterality: N/A;   CARDIAC CATHETERIZATION N/A 03/14/2016   Procedure: Coronary Stent Intervention;  Surgeon: Peter M Swaziland, MD;  Location: [facility] INVASIVE CV LAB;  Service: Cardiovascular;  Laterality: N/A;   CEREBRAL ANEURYSM REPAIR  04/2019   stent placed   CHOLECYSTECTOMY OPEN  1984   COLONOSCOPY WITH PROPOFOL N/A 09/20/2018   Procedure: COLONOSCOPY WITH PROPOFOL;  Surgeon: Corbin Ade, MD;  [facility];  Service: Endoscopy;  Laterality: N/A;  10:30am   CORONARY ANGIOPLASTY WITH STENT PLACEMENT  03/14/2016   ESOPHAGOGASTRODUODENOSCOPY (EGD) WITH PROPOFOL N/A 09/20/2018   Procedure: ESOPHAGOGASTRODUODENOSCOPY (EGD) WITH PROPOFOL;  Surgeon: Corbin Ade, MD;  [facility];  Service: Endoscopy;  Laterality: N/A;   ESOPHAGOGASTRODUODENOSCOPY (EGD) WITH PROPOFOL N/A 01/05/2021   Procedure: ESOPHAGOGASTRODUODENOSCOPY (EGD) WITH PROPOFOL;  Surgeon: Rachael Fee, MD;  Location: [facility] ENDOSCOPY;  Service:  Endoscopy;  Laterality: N/A;   ESOPHAGOGASTRODUODENOSCOPY (EGD) WITH PROPOFOL N/A 04/27/2022   Procedure: ESOPHAGOGASTRODUODENOSCOPY (EGD) WITH PROPOFOL;  Surgeon: Lanelle Bal, DO;  [facility];  Service: Endoscopy;  Laterality: N/A;  9:15am, asa 3/4, ASAP   FINGER ARTHROPLASTY Left 05/14/2013   Procedure: LEFT THUMB CARPAL METACARPAL ARTHROPLASTY;  Surgeon: Tami Ribas, MD;  Location: [facility];  Service:  Orthopedics;  Laterality: Left;   ICD IMPLANT N/A 04/03/2020   Procedure: ICD IMPLANT;  Surgeon: Regan Lemming, MD;  Location: [facility] INVASIVE CV LAB;  Service: Cardiovascular;  Laterality: N/A;   IR ANGIO INTRA EXTRACRAN SEL COM CAROTID INNOMINATE BILAT MOD SED  01/05/2017   IR ANGIO INTRA EXTRACRAN SEL COM CAROTID INNOMINATE BILAT MOD SED  03/19/2019   IR ANGIO INTRA EXTRACRAN SEL COM CAROTID INNOMINATE BILAT MOD SED  06/04/2020   IR ANGIO INTRA EXTRACRAN SEL INTERNAL CAROTID UNI L MOD SED  04/15/2019   IR ANGIO VERTEBRAL SEL VERTEBRAL BILAT MOD SED  01/05/2017   IR ANGIO VERTEBRAL SEL VERTEBRAL BILAT MOD SED  03/19/2019   IR ANGIO VERTEBRAL SEL VERTEBRAL UNI L MOD SED  06/04/2020   IR ANGIOGRAM FOLLOW UP STUDY  04/15/2019   IR RADIOLOGIST EVAL & MGMT  12/30/2016   IR TRANSCATH/EMBOLIZ  04/15/2019   IR US GUIDE VASC ACCESS RIGHT  03/19/2019   IR US GUIDE VASC ACCESS RIGHT  06/04/2020   JOINT REPLACEMENT  05-15-2013   MALONEY DILATION N/A 09/20/2018   Procedure: Elease Hashimoto DILATION;  Surgeon: Corbin Ade, MD;  [facility]: [facility];  Service: Endoscopy;  Laterality: N/A;   RADIOLOGY WITH ANESTHESIA N/A 04/15/2019   Procedure: Sharman Crate;  Surgeon: Julieanne Cotton, MD;  Location: [facility] OR;  Service: Radiology;  Laterality: N/A;   RIGHT/LEFT HEART CATH AND CORONARY ANGIOGRAPHY N/A 08/19/2019   Procedure: RIGHT/LEFT HEART CATH AND CORONARY ANGIOGRAPHY;  Surgeon: Laurey Morale, MD;  Location: [facility] INVASIVE CV LAB;  Service: Cardiovascular;  Laterality: N/A;   TUBAL LIGATION  1987   VAGINAL HYSTERECTOMY  2009    Prior to Admission medications   Medication Sig Start Date End Date Taking? Authorizing Provider  aspirin EC 81 MG tablet Take 81 mg by mouth every evening.    Yes [provider]  atorvastatin (LIPITOR) 80 MG tablet Take 1 tablet (80 mg total) by mouth daily. 09/05/22  Yes Laurey Morale, MD  clopidogrel (PLAVIX) 75 MG tablet Take 1 tablet by mouth once daily 07/12/22   Yes Laurey Morale, MD  cyproheptadine (PERIACTIN) 4 MG tablet Take 2 tablets by mouth twice daily 08/17/22  Yes Alfonse Spruce, MD  dapagliflozin propanediol (FARXIGA) 10 MG TABS tablet Take 1 tablet (10 mg total) by mouth daily before breakfast. 09/12/22  Yes Milford, Jessica M, FNP  Evolocumab (REPATHA SURECLICK) 140 MG/ML SOAJ Inject 140 mg into the skin every 14 (fourteen) days. 09/07/22  Yes Laurey Morale, MD  fenofibrate (TRICOR) 145 MG tablet Take 1 tablet (145 mg total) by mouth daily. 07/27/22  Yes Laurey Morale, MD  furosemide (LASIX) 20 MG tablet Take 1 tablet (20 mg total) by mouth daily. 06/13/22  Yes Laurey Morale, MD  gabapentin (NEURONTIN) 300 MG capsule Take 1 capsule by mouth at bedtime 11/22/22  Yes Billie Lade, MD  linaclotide [facility]) 145 MCG CAPS capsule Take 1 capsule (145 mcg total) by mouth daily before breakfast. 11/22/22  Yes Corday Wyka, Gerrit Friends, MD  losartan (COZAAR) 25 MG tablet Take  1 tablet (25 mg total) by mouth at bedtime. 09/12/22  Yes Milford, Anderson Malta, FNP  metoprolol succinate (TOPROL XL) 25 MG 24 hr tablet Take 1 tablet (25 mg total) by mouth daily. 12/19/22  Yes Laurey Morale, MD  Multiple Vitamins-Minerals (MULTIVITAMIN WITH MINERALS) tablet Take 1 tablet by mouth daily.   Yes [provider]  ondansetron (ZOFRAN) 4 MG tablet Take 1 tablet (4 mg total) by mouth every 6 (six) hours as needed for nausea or vomiting. 11/23/22  Yes Najah Liverman, Gerrit Friends, MD  pantoprazole (PROTONIX) 40 MG tablet TAKE 1 TABLET BY MOUTH TWICE DAILY BEFORE A MEAL 10/17/22  Yes Gelene Mink, NP  potassium chloride SA (KLOR-CON M) 20 MEQ tablet Take 1 tablet (20 mEq total) by mouth 2 (two) times daily. 07/11/22  Yes Laurey Morale, MD  rOPINIRole (REQUIP) 0.5 MG tablet Take 0.5 mg by mouth at bedtime.  11/13/17  Yes [provider]  spironolactone (ALDACTONE) 25 MG tablet Take 1 tablet (25 mg total) by mouth at bedtime. 03/07/22  Yes Laurey Morale, MD  sucralfate  (CARAFATE) 1 GM/10ML suspension Take 10 mLs (1 g total) by mouth 4 (four) times daily. 04/27/22 04/27/23 Yes Carver, Hennie Duos, DO  traMADol (ULTRAM) 50 MG tablet Take 1 tablet (50 mg total) by mouth 3 (three) times daily as needed. 07/27/22  Yes Cristie Hem, PA-C  varenicline (CHANTIX) 1 MG tablet Take 1 tablet (1 mg total) by mouth 2 (two) times daily. 09/12/22  Yes Milford, Anderson Malta, FNP  [name] 1 g capsule Take 2 capsules (2 g total) by mouth 2 (two) times daily. 08/18/22  Yes Laurey Morale, MD  Vitamin D, Ergocalciferol, (DRISDOL) 1.25 MG (50000 UNIT) CAPS capsule Take 50,000 Units by mouth once a week. Takes on Mondays. 12/07/21  Yes [provider]  EPINEPHrine (EPIPEN 2-PAK) 0.3 mg/0.3 mL IJ SOAJ injection Inject 0.3 mg into the muscle as needed for anaphylaxis. 04/23/21   Alfonse Spruce, MD  loperamide (IMODIUM) 2 MG capsule Take 2 mg by mouth 4 (four) times daily as needed for diarrhea or loose stools (ibs).    [provider]  nitroGLYCERIN (NITROSTAT) 0.4 MG SL tablet Place 1 tablet (0.4 mg total) under the tongue every 5 (five) minutes x 3 doses as needed for chest pain (if no relief after 2nd dose, proceed to the ED for an evaluation or call 911). 09/19/22   Laurey Morale, MD  Varenicline Tartrate, Starter, 0.5 MG X 11 & 1 MG X 42 TBPK Take 0.5 daily on days 1 - 3 Take 0.5 bid on days 4 - 7 Then take 1 mg bid thereafter 09/12/22   Jacklynn Ganong, FNP    Allergies as of 11/23/2022 - Review Complete 11/08/2022  Allergen Reaction Noted   Alpha-gal Shortness Of Breath, Nausea And Vomiting, and Dermatitis 02/28/2022   Other Shortness Of Breath, Diarrhea, Nausea And Vomiting, and Nausea Only 03/02/2021   Tape Other (See Comments) 01/03/2017    Family History  Problem Relation Age of Onset   Stroke Mother    Hypertension Mother    Diabetes Mother    Heart attack Mother    Heart attack Father    Diabetes Father    Hypertension Father    CAD Father     Colon polyps Father 89       pre-cancerous    Stroke Father    Dementia Father    Hyperlipidemia Father    Arthritis  Father    COPD Father    Heart disease Father    Breast cancer Maternal Grandmother    Diabetes Maternal Grandmother    Cancer Maternal Grandfather        Tongue and esophageal   Anxiety disorder Daughter    Depression Daughter    Anxiety disorder Daughter    Heart failure Other    Cancer Paternal Grandmother    Colon cancer Neg Hx     Social History   Socioeconomic History   Marital status: Married    Spouse name: Not on file   Number of children: Not on file   Years of education: Not on file   Highest education level: Not on file  Occupational History   Occupation: CNA  Tobacco Use   Smoking status: Every Day    Current packs/day: 0.25    Average packs/day: 0.3 packs/day for 15.0 years (3.8 ttl pk-yrs)    Types: Cigarettes   Smokeless tobacco: Never   Tobacco comments:    smokes a cigarette occasionally  Vaping Use   Vaping status: Never Used  Substance and Sexual Activity   Alcohol use: Yes    Comment: occasionally   Drug use: Not Currently    Types: Marijuana    Comment: former- 2017 last time   Sexual activity: Not Currently    Birth control/protection: Surgical    Comment: hyst  Other Topics Concern   Not on file  Social History Narrative   Lives with husband in [place] in a one story home with a basement.  Has 4 children.  Works as a Lawyer.  Education: CNA school.    Social Determinants of Health   Financial Resource Strain: Low Risk  (06/11/2021)   Overall Financial Resource Strain (CARDIA)    Difficulty of Paying Living Expenses: Not hard at all  Food Insecurity: No Food Insecurity (06/11/2021)   Hunger Vital Sign    Worried About Running Out of Food in the Last Year: Never true    Ran Out of Food in the Last Year: Never true  Transportation Needs: No Transportation Needs (06/11/2021)   PRAPARE - Scientist, research (physical sciences) (Medical): No    Lack of Transportation (Non-Medical): No  Physical Activity: Insufficiently Active (06/11/2021)   Exercise Vital Sign    Days of Exercise per Week: 4 days    Minutes of Exercise per Session: 30 min  Stress: Stress Concern Present (06/11/2021)   [employer] of Occupational Health - Occupational Stress Questionnaire    Feeling of Stress : Very much  Social Connections: Socially Isolated (06/11/2021)   Social Connection and Isolation Panel [NHANES]    Frequency of Communication with Friends and Family: Once a week    Frequency of Social Gatherings with Friends and Family: Never    Attends Religious Services: Never    Database administrator or Organizations: No    Attends Banker Meetings: Never    Marital Status: Married  Catering manager Violence: Not At Risk (06/11/2021)   Humiliation, Afraid, Rape, and Kick questionnaire    Fear of Current or Ex-Partner: No    Emotionally Abused: No    Physically Abused: No    Sexually Abused: No    Review of Systems: See HPI, otherwise negative ROS  Physical Exam: BP 115/74   Pulse 61   Temp 98.2 F (36.8 C) (Oral)   Resp 20   Ht 5' (1.524 m)   Wt 51 kg  SpO2 100%   BMI 21.96 kg/m  General:   Alert,  Well-developed, well-nourished, pleasant and cooperative in NAD Neck:  Supple; no masses or thyromegaly. No significant cervical adenopathy. Lungs:  Clear throughout to auscultation.   No wheezes, crackles, or rhonchi. No acute distress. Heart:  Regular rate and rhythm; no murmurs, clicks, rubs,  or gallops. Abdomen: Non-distended, normal bowel sounds.  Soft and nontender without appreciable mass or hepatosplenomegaly.   Impression/Plan:    62 year old lady with multiple comorbidities here for further evaluation of intermittent esophageal dysphagia to solids.  I have offered the patient EGD with esophageal dilation as feasible/appropriate per plan.  She remains on Plavix per plan. The risks,  benefits, limitations, alternatives and imponderables have been reviewed with the patient. Potential for esophageal dilation, biopsy, etc. have also been reviewed.  Questions have been answered. All parties agreeable.     Discussed with anesthesia.     Notice: This dictation was prepared with Dragon dictation along with smaller phrase technology. Any transcriptional errors that result from this process are unintentional and may not be corrected upon review.

## 2023-01-04 NOTE — Op Note (Signed)
[facility] Patient Name: [place] Procedure Date: 01/04/2023 1:13 PM MRN: [id number] Date of Birth: [birth year] Attending MD: Gennette Pac , MD, [id number] CSN: [id number] Age: 62 Admit Type: Outpatient Procedure:                Upper GI endoscopy Indications:              Dysphagia Providers:                Gennette Pac, MD, Nena Polio, RN, Pandora Leiter, Technician Referring MD:              Medicines:                Propofol per Anesthesia Complications:            No immediate complications. Estimated Blood Loss:     Estimated blood loss: none. Procedure:                Pre-Anesthesia Assessment:                           - Prior to the procedure, a History and Physical                            was performed, and patient medications and                            allergies were reviewed. The patient's tolerance of                            previous anesthesia was also reviewed. The risks                            and benefits of the procedure and the sedation                            options and risks were discussed with the patient.                            All questions were answered, and informed consent                            was obtained. Prior Anticoagulants: The patient                            last took Plavix (clopidogrel) 1 day prior to the                            procedure. ASA Grade Assessment: III - A patient                            with severe systemic disease. After reviewing the  risks and benefits, the patient was deemed in                            satisfactory condition to undergo the procedure.                           After obtaining informed consent, the endoscope was                            passed under direct vision. Throughout the                            procedure, the patient's blood pressure, pulse, and                            oxygen  saturations were monitored continuously. The                            GIF-H190 ([id number]) scope was introduced through the                            mouth, and advanced to the second part of duodenum.                            The upper GI endoscopy was accomplished without                            difficulty. The patient tolerated the procedure                            well. Scope In: 1:27:18 PM Scope Out: 1:33:01 PM Total Procedure Duration: 0 hours 5 minutes 43 seconds  Findings:      The examined esophagus was normal.      The entire examined stomach was normal.      The duodenal bulb and second portion of the duodenum were normal. The       scope was withdrawn. Dilation was performed with a Maloney dilator with       mild resistance at 54 Fr. The dilation site was examined following       endoscope reinsertion and showed no change. Estimated blood loss: none. Impression:               - Normal esophagus. Dilated.                           - Normal stomach.                           - Normal duodenal bulb and second portion of the                            duodenum.                           - No specimens collected. Moderate Sedation:      Moderate (conscious) sedation was personally  administered by an       anesthesia professional. The following parameters were monitored: oxygen       saturation, heart rate, blood pressure, respiratory rate, EKG, adequacy       of pulmonary ventilation, and response to care. Recommendation:           - Patient has a contact number available for                            emergencies. The signs and symptoms of potential                            delayed complications were discussed with the                            patient. Return to normal activities tomorrow.                            Written discharge instructions were provided to the                            patient.                           - Advance diet as tolerated.                            - Continue present medications.                           - Return to my office in 6 weeks. Procedure Code(s):        --- Professional ---                           [phone], Esophagogastroduodenoscopy, flexible,                            transoral; diagnostic, including collection of                            specimen(s) by brushing or washing, when performed                            (separate procedure)                           43450, Dilation of esophagus, by unguided sound or                            bougie, single or multiple passes Diagnosis Code(s):        --- Professional ---                           R13.10, Dysphagia, unspecified CPT copyright 2022 American Medical Association. All rights reserved. The codes documented in this report are preliminary and upon coder review may  be revised to meet current compliance requirements. Gerrit Friends. Jena Gauss, MD Molly Maduro  Roetta Sessions, MD 01/04/2023 1:41:25 PM This report has been signed electronically. Number of Addenda: 0

## 2023-01-04 NOTE — Discharge Instructions (Signed)
EGD Discharge instructions Please read the instructions outlined below and refer to this sheet in the next few weeks. These discharge instructions provide you with general information on caring for yourself after you leave the hospital. Your doctor may also give you specific instructions. While your treatment has been planned according to the most current medical practices available, unavoidable complications occasionally occur. If you have any problems or questions after discharge, please call your doctor. ACTIVITY You may resume your regular activity but move at a slower pace for the next 24 hours.  Take frequent rest periods for the next 24 hours.  Walking will help expel (get rid of) the air and reduce the bloated feeling in your abdomen.  No driving for 24 hours (because of the anesthesia (medicine) used during the test).  You may shower.  Do not sign any important legal documents or operate any machinery for 24 hours (because of the anesthesia used during the test).  NUTRITION Drink plenty of fluids.  You may resume your normal diet.  Begin with a light meal and progress to your normal diet.  Avoid alcoholic beverages for 24 hours or as instructed by your caregiver.  MEDICATIONS You may resume your normal medications unless your caregiver tells you otherwise.  WHAT YOU CAN EXPECT TODAY You may experience abdominal discomfort such as a feeling of fullness or "gas" pains.  FOLLOW-UP Your doctor will discuss the results of your test with you.  SEEK IMMEDIATE MEDICAL ATTENTION IF ANY OF THE FOLLOWING OCCUR: Excessive nausea (feeling sick to your stomach) and/or vomiting.  Severe abdominal pain and distention (swelling).  Trouble swallowing.  Temperature over 101 F (37.8 C).  Rectal bleeding or vomiting of blood.       Your EGD today was normal.  I did stretch your esophagus   we will arrange an appointment to come back and see Erin [name] in 4 to 6 weeks   at patient request,  I called Erin [name] at [phone] -  call rolled to voicemail.  Left a message.

## 2023-01-04 NOTE — Anesthesia Preprocedure Evaluation (Signed)
Anesthesia Evaluation  Patient identified by MRN, date of birth, ID band Patient awake    Reviewed: Allergy & Precautions, H&P , NPO status , Patient's Chart, lab work & pertinent test results, reviewed documented beta blocker date and time   Airway Mallampati: II  TM Distance: >3 FB Neck ROM: full    Dental no notable dental hx.    Pulmonary neg pulmonary ROS, pneumonia, Current Smoker   Pulmonary exam normal breath sounds clear to auscultation       Cardiovascular Exercise Tolerance: Good hypertension, + CAD, + Past MI and +CHF  negative cardio ROS + Cardiac Defibrillator  Rhythm:regular Rate:Normal     Neuro/Psych  PSYCHIATRIC DISORDERS Anxiety Depression     Neuromuscular disease negative neurological ROS  negative psych ROS   GI/Hepatic negative GI ROS, Neg liver ROS,GERD  ,,  Endo/Other  negative endocrine ROS    Renal/GU Renal diseasenegative Renal ROS  negative genitourinary   Musculoskeletal   Abdominal   Peds  Hematology negative hematology ROS (+) Blood dyscrasia, anemia   Anesthesia Other Findings  RHC/LHC done with exertional chest heaviness and dyspnea in 4/21 showed normal filling pressures, preserved cardiac output, and nonobstructive mild CAD.  ABIs were normal in 4/21.    In 8/21, she had syncope thought to be related to orthostasis from low BP in the setting of cardiac medication titration.  Entresto was decreased.    She was in the ER in 11/21 with chest pain.  Troponin and COVID-19 negative. Echo in 11/21 showed EF 30-35%, normal RV.    She was hospitalized in 6/22 with left-sided weakness/numbness.  No evidence for acute CVA, ?TIA.     Echo in 11/22 showed EF 30-35% with mild LV dilation, normal RV, mild-moderate MR, IVC normal.    Echo 2/24 showed EF remains 30-35% with normal RV.    Reproductive/Obstetrics negative OB ROS                             Anesthesia  Physical Anesthesia Plan  ASA: 3  Anesthesia Plan: General   Post-op Pain Management:    Induction:   PONV Risk Score and Plan: Propofol infusion  Airway Management Planned:   Additional Equipment:   Intra-op Plan:   Post-operative Plan:   Informed Consent: I have reviewed the patients History and Physical, chart, labs and discussed the procedure including the risks, benefits and alternatives for the proposed anesthesia with the patient or authorized representative who has indicated his/her understanding and acceptance.     Dental Advisory Given  Plan Discussed with: CRNA  Anesthesia Plan Comments:        Anesthesia Quick Evaluation

## 2023-01-06 ENCOUNTER — Ambulatory Visit (HOSPITAL_COMMUNITY)
Admission: RE | Admit: 2023-01-06 | Discharge: 2023-01-06 | Disposition: A | Discharge status: Home / Self Care | Payer: Medicare PPO | Source: Ambulatory Visit | Attending: Orthopaedic Surgery | Admitting: Orthopaedic Surgery

## 2023-01-06 ENCOUNTER — Ambulatory Visit (HOSPITAL_COMMUNITY)
Admission: RE | Admit: 2023-01-06 | Discharge: 2023-01-06 | Disposition: A | Discharge status: Home / Self Care | Payer: Medicare PPO | Source: Ambulatory Visit | Attending: Gastroenterology | Admitting: Gastroenterology

## 2023-01-06 DIAGNOSIS — M25512 Pain in left shoulder: Secondary | ICD-10-CM | POA: Diagnosis present

## 2023-01-06 DIAGNOSIS — M25511 Pain in right shoulder: Secondary | ICD-10-CM | POA: Insufficient documentation

## 2023-01-06 DIAGNOSIS — G8929 Other chronic pain: Secondary | ICD-10-CM | POA: Diagnosis present

## 2023-01-06 DIAGNOSIS — K862 Cyst of pancreas: Secondary | ICD-10-CM | POA: Diagnosis present

## 2023-01-06 MED ORDER — GADOBUTROL 1 MMOL/ML IV SOLN
5.0000 mL | Freq: Once | INTRAVENOUS | Status: AC | PRN
  Administered 2023-01-06: 5 mL via INTRAVENOUS

## 2023-01-06 NOTE — Anesthesia Postprocedure Evaluation (Signed)
Anesthesia Post Note  Patient: Erin [name]  Procedure(s) Performed: ESOPHAGOGASTRODUODENOSCOPY (EGD) WITH PROPOFOL MALONEY DILATION  Patient location during evaluation: Phase II Anesthesia Type: General Level of consciousness: awake Pain management: pain level controlled Vital Signs Assessment: post-procedure vital signs reviewed and stable Respiratory status: spontaneous breathing and respiratory function stable Cardiovascular status: blood pressure returned to baseline and stable Postop Assessment: no headache and no apparent nausea or vomiting Anesthetic complications: no Comments: Late entry   No notable events documented.   Last Vitals:  Vitals:   01/04/23 1337 01/04/23 1339  BP: (!) 87/43 122/64  Pulse: 64   Resp: 18   Temp:    SpO2: 100%     Last Pain:  Vitals:   01/05/23 1442  TempSrc:   PainSc: 0-No pain                 Windell Norfolk

## 2023-01-10 ENCOUNTER — Ambulatory Visit: Payer: Medicare PPO | Admitting: Orthopaedic Surgery

## 2023-01-10 ENCOUNTER — Encounter: Payer: Self-pay | Admitting: Orthopaedic Surgery

## 2023-01-10 DIAGNOSIS — M25511 Pain in right shoulder: Secondary | ICD-10-CM

## 2023-01-10 DIAGNOSIS — M25512 Pain in left shoulder: Secondary | ICD-10-CM | POA: Diagnosis not present

## 2023-01-10 DIAGNOSIS — G8929 Other chronic pain: Secondary | ICD-10-CM | POA: Diagnosis not present

## 2023-01-10 NOTE — Addendum Note (Signed)
Addended by: Wendi Maya on: 01/10/2023 01:38 PM   Modules accepted: Orders

## 2023-01-10 NOTE — Progress Notes (Signed)
Office Visit Note   Patient: Erin [name]           Date of Birth: July 25, 1960           MRN: [id number] Visit Date: 01/10/2023              Requested by: Billie Lade, MD [address] [place],  [place] [address] PCP: Billie Lade, MD   Assessment & Plan: Visit Diagnoses:  1. Chronic pain of both shoulders     Plan: Erin [name] is a 62 year old female with primarily glenohumeral arthritis that is severe on the left side.  Had temporary relief from cortisone injections in June.  At this point she would like to have a surgical consultation with either Dr. August Saucer or Steward Drone to discuss left shoulder arthroplasty.  Referral will be placed.  Follow-Up Instructions: No follow-ups on file.   Orders:  No orders of the defined types were placed in this encounter.  No orders of the defined types were placed in this encounter.     Procedures: No procedures performed   Clinical Data: No additional findings.   Subjective: Chief Complaint  Patient presents with   Left Shoulder - Follow-up    MRI review   Right Shoulder - Follow-up    MRI review    HPI Erin [name] returns today for MRI review. Review of Systems   Objective: Vital Signs: There were no vitals taken for this visit.  Physical Exam  Ortho Exam Lamination bilateral shoulders unchanged. Specialty Comments:  No specialty comments available.  Imaging: No results found.   PMFS History: Patient Active Problem List   Diagnosis Date Noted   RUQ pain 06/29/2022   Pancreatic cyst 06/29/2022   Peripheral neuropathy 05/04/2022   Restless leg syndrome 05/04/2022   Hypertriglyceridemia 05/04/2022   Bilateral shoulder pain 05/04/2022   Encounter for general adult medical examination with abnormal findings 05/04/2022   H/O total hysterectomy 04/26/2022   Odynophagia 04/20/2022   Abdominal pain, epigastric 04/20/2022   Nausea without vomiting 06/16/2021   Allergy to alpha-gal 06/16/2021    Orthostatic hypotension 06/16/2021   Hypocortisolemia (HCC) 03/02/2021   Adrenal adenoma, left 03/02/2021   AKI (acute kidney injury) (HCC) 01/04/2021   Intractable nausea and vomiting 01/03/2021   Left-sided weakness 10/19/2020   Cough 02/11/2020   Near syncope 12/10/2019   Brain aneurysm 04/15/2019   Dysphagia 02/27/2018   Encounter for screening colonoscopy 02/27/2018   History of Clostridium difficile infection 02/27/2018   Chronic diarrhea 12/20/2017   Chronic combined systolic and diastolic congestive heart failure (HCC) 06/20/2016   Hypokalemia due to excessive gastrointestinal loss of potassium    Acute CHF (congestive heart failure) (HCC) 05/16/2016   Acute on chronic systolic CHF (congestive heart failure) (HCC) 05/16/2016   Acute respiratory failure with hypoxia (HCC)    Thrombocytosis 03/26/2016   Cardiomyopathy, ischemic 03/25/2016   Chronic combined systolic and diastolic heart failure (HCC) 03/25/2016   Anxiety state 03/25/2016   Troponin level elevated 03/25/2016   Coronary artery disease involving native coronary artery of native heart 03/25/2016   Normocytic anemia 03/25/2016   SOB (shortness of breath) 03/24/2016   Lightheadedness 03/17/2016   Hypotension 03/17/2016   Tobacco abuse 03/12/2016   NSTEMI (non-ST elevated myocardial infarction) (HCC) 03/11/2016   Atypical chest pain    Essential hypertension 09/06/2015   Hyperlipidemia 09/06/2015   GERD without esophagitis 09/06/2015   Chest pain 09/06/2015   Past Medical History:  Diagnosis Date   AICD (  automatic cardioverter/defibrillator) present    Allergic reaction to alpha-gal    Allergy 03/05/2022   Anemia    Anxiety state 03/25/2016   Arthritis    Basal cell carcinoma of forehead    Brain aneurysm    CHF (congestive heart failure) (HCC)    Coronary artery disease    a. 03/11/16 PCI with DES-->Prox/Mid Cx;  b. 03/14/16 PCI with DES x2-->RCA, EF 30-35%.   Depression 06/09/2010   Encounter for  general adult medical examination with abnormal findings 05/04/2022   Essential hypertension    GERD (gastroesophageal reflux disease)    HFrEF (heart failure with reduced ejection fraction) (HCC)    a. 10/2016 Echo: EF 35-40%, Gr1 DD, mild focal basal septal hypertrophy, basal inflat, mid inflat, basal antlat AK. Mid infept/inf/antlat, apical lateral sev HK. Mod MR. mildly reduced RV fxn. Mild TR.   History of pneumonia    Hyperlipidemia    IBS (irritable bowel syndrome)    Ischemic cardiomyopathy    a. 10/2016 Echo: EF 35-40%, Gr1 DD.   Mitral regurgitation    Neuromuscular disorder (HCC)    NSTEMI (non-ST elevated myocardial infarction) (HCC) 03/10/2016   Pneumonia 03/2016   Shingles    Squamous cell cancer of skin of nose    Thrombocytosis 03/26/2016   Tobacco abuse    Trichimoniasis    Wears dentures    Wears glasses     Family History  Problem Relation Age of Onset   Stroke Mother    Hypertension Mother    Diabetes Mother    Heart attack Mother    Heart attack Father    Diabetes Father    Hypertension Father    CAD Father    Colon polyps Father 64       pre-cancerous    Stroke Father    Dementia Father    Hyperlipidemia Father    Arthritis Father    COPD Father    Heart disease Father    Breast cancer Maternal Grandmother    Diabetes Maternal Grandmother    Cancer Maternal Grandfather        Tongue and esophageal   Anxiety disorder Daughter    Depression Daughter    Anxiety disorder Daughter    Heart failure Other    Cancer Paternal Grandmother    Colon cancer Neg Hx     Past Surgical History:  Procedure Laterality Date   APPENDECTOMY     BIOPSY  09/20/2018   Procedure: BIOPSY;  Surgeon: Corbin Ade, MD;  [facility];  Service: Endoscopy;;  colon   BIOPSY  01/05/2021   Procedure: BIOPSY;  Surgeon: Rachael Fee, MD;  Location: [facility] ENDOSCOPY;  Service: Endoscopy;;   BIOPSY  04/27/2022   Procedure: BIOPSY;  Surgeon: Lanelle Bal, DO;   Location: [facility];  Service: Endoscopy;;   CARDIAC CATHETERIZATION N/A 03/11/2016   Procedure: Left Heart Cath and Coronary Angiography;  Surgeon: Marykay Lex, MD;  Location: [facility] INVASIVE CV LAB;  Service: Cardiovascular;  Laterality: N/A;   CARDIAC CATHETERIZATION N/A 03/11/2016   Procedure: Coronary Stent Intervention;  Surgeon: Marykay Lex, MD;  Location: [facility] INVASIVE CV LAB;  Service: Cardiovascular;  Laterality: N/A;   CARDIAC CATHETERIZATION N/A 03/14/2016   Procedure: Coronary Stent Intervention;  Surgeon: Peter M Swaziland, MD;  Location: [facility] INVASIVE CV LAB;  Service: Cardiovascular;  Laterality: N/A;   CEREBRAL ANEURYSM REPAIR  04/2019   stent placed   CHOLECYSTECTOMY OPEN  1984   COLONOSCOPY  WITH PROPOFOL N/A 09/20/2018   Procedure: COLONOSCOPY WITH PROPOFOL;  Surgeon: Corbin Ade, MD;  [facility]: [facility];  Service: Endoscopy;  Laterality: N/A;  10:30am   CORONARY ANGIOPLASTY WITH STENT PLACEMENT  03/14/2016   ESOPHAGOGASTRODUODENOSCOPY (EGD) WITH PROPOFOL N/A 09/20/2018   Procedure: ESOPHAGOGASTRODUODENOSCOPY (EGD) WITH PROPOFOL;  Surgeon: Corbin Ade, MD;  [facility];  Service: Endoscopy;  Laterality: N/A;   ESOPHAGOGASTRODUODENOSCOPY (EGD) WITH PROPOFOL N/A 01/05/2021   Procedure: ESOPHAGOGASTRODUODENOSCOPY (EGD) WITH PROPOFOL;  Surgeon: Rachael Fee, MD;  Location: [facility] ENDOSCOPY;  Service: Endoscopy;  Laterality: N/A;   ESOPHAGOGASTRODUODENOSCOPY (EGD) WITH PROPOFOL N/A 04/27/2022   Procedure: ESOPHAGOGASTRODUODENOSCOPY (EGD) WITH PROPOFOL;  Surgeon: Lanelle Bal, DO;  [facility];  Service: Endoscopy;  Laterality: N/A;  9:15am, asa 3/4, ASAP   FINGER ARTHROPLASTY Left 05/14/2013   Procedure: LEFT THUMB CARPAL METACARPAL ARTHROPLASTY;  Surgeon: Tami Ribas, MD;  Location: [facility];  Service: Orthopedics;  Laterality: Left;   ICD IMPLANT N/A 04/03/2020   Procedure: ICD IMPLANT;  Surgeon: Regan Lemming, MD;  Location: [facility] INVASIVE CV LAB;  Service: Cardiovascular;  Laterality: N/A;   IR ANGIO INTRA EXTRACRAN SEL COM CAROTID INNOMINATE BILAT MOD SED  01/05/2017   IR ANGIO INTRA EXTRACRAN SEL COM CAROTID INNOMINATE BILAT MOD SED  03/19/2019   IR ANGIO INTRA EXTRACRAN SEL COM CAROTID INNOMINATE BILAT MOD SED  06/04/2020   IR ANGIO INTRA EXTRACRAN SEL INTERNAL CAROTID UNI L MOD SED  04/15/2019   IR ANGIO VERTEBRAL SEL VERTEBRAL BILAT MOD SED  01/05/2017   IR ANGIO VERTEBRAL SEL VERTEBRAL BILAT MOD SED  03/19/2019   IR ANGIO VERTEBRAL SEL VERTEBRAL UNI L MOD SED  06/04/2020   IR ANGIOGRAM FOLLOW UP STUDY  04/15/2019   IR RADIOLOGIST EVAL & MGMT  12/30/2016   IR TRANSCATH/EMBOLIZ  04/15/2019   IR US GUIDE VASC ACCESS RIGHT  03/19/2019   IR US GUIDE VASC ACCESS RIGHT  06/04/2020   JOINT REPLACEMENT  05-15-2013   MALONEY DILATION N/A 09/20/2018   Procedure: Elease Hashimoto DILATION;  Surgeon: Corbin Ade, MD;  [facility];  Service: Endoscopy;  Laterality: N/A;   RADIOLOGY WITH ANESTHESIA N/A 04/15/2019   Procedure: Sharman Crate;  Surgeon: Julieanne Cotton, MD;  Location: [facility] OR;  Service: Radiology;  Laterality: N/A;   RIGHT/LEFT HEART CATH AND CORONARY ANGIOGRAPHY N/A 08/19/2019   Procedure: RIGHT/LEFT HEART CATH AND CORONARY ANGIOGRAPHY;  Surgeon: Laurey Morale, MD;  Location: [facility] INVASIVE CV LAB;  Service: Cardiovascular;  Laterality: N/A;   TUBAL LIGATION  1987   VAGINAL HYSTERECTOMY  2009   Social History   Occupational History   Occupation: CNA  Tobacco Use   Smoking status: Every Day    Current packs/day: 0.25    Average packs/day: 0.3 packs/day for 15.0 years (3.8 ttl pk-yrs)    Types: Cigarettes   Smokeless tobacco: Never   Tobacco comments:    smokes a cigarette occasionally  Vaping Use   Vaping status: Never Used  Substance and Sexual Activity   Alcohol use: Yes    Comment: occasionally   Drug use: Not Currently    Types: Marijuana    Comment: former-  2017 last time   Sexual activity: Not Currently    Birth control/protection: Surgical    Comment: hyst

## 2023-01-12 ENCOUNTER — Encounter (HOSPITAL_COMMUNITY): Payer: Self-pay | Admitting: Emergency Medicine

## 2023-01-12 ENCOUNTER — Other Ambulatory Visit: Payer: Self-pay

## 2023-01-12 ENCOUNTER — Emergency Department (HOSPITAL_COMMUNITY)
Admission: EM | Admit: 2023-01-12 | Discharge: 2023-01-12 | Disposition: A | Discharge status: Home / Self Care | Payer: Medicare PPO | Attending: Emergency Medicine | Admitting: Emergency Medicine

## 2023-01-12 ENCOUNTER — Emergency Department (HOSPITAL_COMMUNITY): Payer: Medicare PPO

## 2023-01-12 DIAGNOSIS — R0789 Other chest pain: Secondary | ICD-10-CM | POA: Diagnosis not present

## 2023-01-12 DIAGNOSIS — R112 Nausea with vomiting, unspecified: Secondary | ICD-10-CM | POA: Diagnosis not present

## 2023-01-12 DIAGNOSIS — F17200 Nicotine dependence, unspecified, uncomplicated: Secondary | ICD-10-CM | POA: Insufficient documentation

## 2023-01-12 DIAGNOSIS — I509 Heart failure, unspecified: Secondary | ICD-10-CM | POA: Insufficient documentation

## 2023-01-12 DIAGNOSIS — R079 Chest pain, unspecified: Secondary | ICD-10-CM | POA: Diagnosis not present

## 2023-01-12 DIAGNOSIS — Z7982 Long term (current) use of aspirin: Secondary | ICD-10-CM | POA: Diagnosis not present

## 2023-01-12 DIAGNOSIS — R1110 Vomiting, unspecified: Secondary | ICD-10-CM | POA: Diagnosis not present

## 2023-01-12 LAB — COMPREHENSIVE METABOLIC PANEL
ALT: 45 U/L — ABNORMAL HIGH (ref 0–44)
AST: 62 U/L — ABNORMAL HIGH (ref 15–41)
Albumin: 4.2 g/dL (ref 3.5–5.0)
Alkaline Phosphatase: 36 U/L — ABNORMAL LOW (ref 38–126)
Anion gap: 13 (ref 5–15)
BUN: 20 mg/dL (ref 6–20)
CO2: 23 mmol/L (ref 22–32)
Calcium: 9.7 mg/dL (ref 8.9–10.3)
Chloride: 101 mmol/L (ref 98–111)
Creatinine, Ser: 1.46 mg/dL — ABNORMAL HIGH (ref 0.44–1.00)
GFR, Estimated: 41 mL/min — ABNORMAL LOW (ref >60)
Glucose, Bld: 92 mg/dL (ref 70–99)
Potassium: 4 mmol/L (ref 3.5–5.1)
Sodium: 137 mmol/L (ref 135–145)
Total Bilirubin: 1 mg/dL (ref 0.3–1.2)
Total Protein: 6.9 g/dL (ref 6.5–8.1)

## 2023-01-12 LAB — CBC WITH DIFFERENTIAL/PLATELET
Abs Immature Granulocytes: 0.03 10*3/uL (ref 0.00–0.07)
Basophils Absolute: 0.1 10*3/uL (ref 0.0–0.1)
Basophils Relative: 1 %
Eosinophils Absolute: 0.1 10*3/uL (ref 0.0–0.5)
Eosinophils Relative: 1 %
HCT: 39.6 % (ref 36.0–46.0)
Hemoglobin: 13.2 g/dL (ref 12.0–15.0)
Immature Granulocytes: 0 %
Lymphocytes Relative: 26 %
Lymphs Abs: 1.9 10*3/uL (ref 0.7–4.0)
MCH: 27.3 pg (ref 26.0–34.0)
MCHC: 33.3 g/dL (ref 30.0–36.0)
MCV: 82 fL (ref 80.0–100.0)
Monocytes Absolute: 0.6 10*3/uL (ref 0.1–1.0)
Monocytes Relative: 8 %
Neutro Abs: 4.8 10*3/uL (ref 1.7–7.7)
Neutrophils Relative %: 64 %
Platelets: 437 10*3/uL — ABNORMAL HIGH (ref 150–400)
RBC: 4.83 MIL/uL (ref 3.87–5.11)
RDW: 16 % — ABNORMAL HIGH (ref 11.5–15.5)
WBC: 7.4 10*3/uL (ref 4.0–10.5)
nRBC: 0 % (ref 0.0–0.2)

## 2023-01-12 LAB — TROPONIN I (HIGH SENSITIVITY)
Troponin I (High Sensitivity): 9 ng/L (ref <18)
Troponin I (High Sensitivity): 10 ng/L (ref <18)

## 2023-01-12 MED ORDER — SODIUM CHLORIDE 0.9 % IV BOLUS
250.0000 mL | Freq: Once | INTRAVENOUS | Status: AC
  Administered 2023-01-12: 250 mL via INTRAVENOUS

## 2023-01-12 MED ORDER — ONDANSETRON HCL 4 MG/2ML IJ SOLN
4.0000 mg | Freq: Once | INTRAMUSCULAR | Status: AC
  Administered 2023-01-12: 4 mg via INTRAVENOUS
  Filled 2023-01-12: qty 2

## 2023-01-12 MED ORDER — SCOPOLAMINE 1 MG/3DAYS TD PT72: 1.0000 | MEDICATED_PATCH | TRANSDERMAL | 5 refills | Status: DC

## 2023-01-12 NOTE — Discharge Instructions (Addendum)
Thank you for letting us take care of you today.  Your lab workup was within normal limits and did not show etiology for your chest pain or vomiting.  Please follow-up with GI regarding vomiting  Place one patch onto skin every three days

## 2023-01-12 NOTE — ED Provider Notes (Signed)
[place] EMERGENCY DEPARTMENT AT [facility] Provider Note   CSN: [id number] Arrival date & time: 01/12/23  1127     History  Chief Complaint  Patient presents with   Emesis    Erin [name] is a 62 y.o. female with past medical history of HFrEF, hyperlipidemia, IBS, MR, ischemic cardiomyopathy, tobacco abuse to the emergency department for evaluation of 10 out of 10 sharp chest pain that started at 1045 following a vomiting episode.  She reports that by arrival at the ED pain has changed to a heaviness that is now 7 out of 10.  She has been having intractable vomiting since July with no known etiology causing approximately 30 pounds of weight loss.  She is being followed by GI who ordered EGD and MRI of abdomen which has not been able to explain vomiting etiology.  Reports symptoms feels similar to when she was proposed to have possible alpha gal syndrome in 2022. She denies fever, dyspnea, hemoptysis.   Emesis      Home Medications Prior to Admission medications   Medication Sig Start Date End Date Taking? Authorizing Provider  aspirin EC 81 MG tablet Take 81 mg by mouth every evening.    Yes [provider]  atorvastatin (LIPITOR) 80 MG tablet Take 1 tablet (80 mg total) by mouth daily. 09/05/22  Yes Laurey Morale, MD  clopidogrel (PLAVIX) 75 MG tablet Take 1 tablet by mouth once daily 07/12/22  Yes Laurey Morale, MD  dapagliflozin propanediol (FARXIGA) 10 MG TABS tablet Take 1 tablet (10 mg total) by mouth daily before breakfast. 09/12/22  Yes Milford, Anderson Malta, FNP  EPINEPHrine (EPIPEN 2-PAK) 0.3 mg/0.3 mL IJ SOAJ injection Inject 0.3 mg into the muscle as needed for anaphylaxis. 04/23/21  Yes Alfonse Spruce, MD  Evolocumab (REPATHA SURECLICK) 140 MG/ML SOAJ Inject 140 mg into the skin every 14 (fourteen) days. 09/07/22  Yes Laurey Morale, MD  fenofibrate (TRICOR) 145 MG tablet Take 1 tablet (145 mg total) by mouth daily. 07/27/22  Yes Laurey Morale, MD  furosemide (LASIX) 20 MG tablet Take 1 tablet (20 mg total) by mouth daily. 06/13/22  Yes Laurey Morale, MD  gabapentin (NEURONTIN) 300 MG capsule Take 1 capsule by mouth at bedtime 11/22/22  Yes Billie Lade, MD  linaclotide [facility]) 145 MCG CAPS capsule Take 1 capsule (145 mcg total) by mouth daily before breakfast. 11/22/22  Yes Rourk, Gerrit Friends, MD  losartan (COZAAR) 25 MG tablet Take 1 tablet (25 mg total) by mouth at bedtime. 09/12/22  Yes Milford, Anderson Malta, FNP  metoprolol succinate (TOPROL XL) 25 MG 24 hr tablet Take 1 tablet (25 mg total) by mouth daily. 12/19/22  Yes Laurey Morale, MD  Multiple Vitamins-Minerals (MULTIVITAMIN WITH MINERALS) tablet Take 1 tablet by mouth daily.   Yes [provider]  nitroGLYCERIN (NITROSTAT) 0.4 MG SL tablet Place 1 tablet (0.4 mg total) under the tongue every 5 (five) minutes x 3 doses as needed for chest pain (if no relief after 2nd dose, proceed to the ED for an evaluation or call 911). 09/19/22  Yes Laurey Morale, MD  ondansetron (ZOFRAN) 4 MG tablet Take 1 tablet (4 mg total) by mouth every 6 (six) hours as needed for nausea or vomiting. 11/23/22  Yes Rourk, Gerrit Friends, MD  pantoprazole (PROTONIX) 40 MG tablet TAKE 1 TABLET BY MOUTH TWICE DAILY BEFORE A MEAL 10/17/22  Yes Gelene Mink, NP  potassium chloride  SA (KLOR-CON M) 20 MEQ tablet Take 1 tablet (20 mEq total) by mouth 2 (two) times daily. 07/11/22  Yes Laurey Morale, MD  rOPINIRole (REQUIP) 0.5 MG tablet Take 0.5 mg by mouth at bedtime.  11/13/17  Yes [provider]  scopolamine (TRANSDERM-SCOP) 1 MG/3DAYS Place 1 patch (1.5 mg total) onto the skin every 3 (three) days. 01/12/23  Yes Judithann Sheen, PA  spironolactone (ALDACTONE) 25 MG tablet Take 1 tablet (25 mg total) by mouth at bedtime. 03/07/22  Yes Laurey Morale, MD  sucralfate (CARAFATE) 1 GM/10ML suspension Take 10 mLs (1 g total) by mouth 4 (four) times daily. 04/27/22 04/27/23 Yes Carver, Hennie Duos,  DO  traMADol (ULTRAM) 50 MG tablet Take 1 tablet (50 mg total) by mouth 3 (three) times daily as needed. 07/27/22  Yes Cristie Hem, PA-C  varenicline (CHANTIX) 1 MG tablet Take 1 tablet (1 mg total) by mouth 2 (two) times daily. 09/12/22  Yes Milford, Anderson Malta, FNP  [name] 1 g capsule Take 2 capsules (2 g total) by mouth 2 (two) times daily. 08/18/22  Yes Laurey Morale, MD      Allergies    Alpha-gal, Other, and Tape    Review of Systems   Review of Systems  Gastrointestinal:  Positive for vomiting.    Physical Exam Updated Vital Signs BP 112/80   Pulse 74   Temp 98 F (36.7 C)   Resp (!) 23   SpO2 97%  Physical Exam Vitals and nursing note reviewed.  Constitutional:      General: She is not in acute distress.    Appearance: She is well-developed.  HENT:     Head: Normocephalic and atraumatic.  Eyes:     Conjunctiva/sclera: Conjunctivae normal.  Cardiovascular:     Rate and Rhythm: Normal rate and regular rhythm.     Heart sounds: No murmur heard. Pulmonary:     Effort: Pulmonary effort is normal. No respiratory distress.     Breath sounds: Normal breath sounds.  Abdominal:     Palpations: Abdomen is soft.     Tenderness: There is no abdominal tenderness.  Musculoskeletal:        General: No swelling.     Cervical back: Neck supple.     Right lower leg: No edema.     Left lower leg: No edema.  Skin:    General: Skin is warm and dry.     Capillary Refill: Capillary refill takes less than 2 seconds.  Neurological:     Mental Status: She is alert.  Psychiatric:        Mood and Affect: Mood normal.     ED Results / Procedures / Treatments   Labs (all labs ordered are listed, but only abnormal results are displayed) Labs Reviewed  CBC WITH DIFFERENTIAL/PLATELET - Abnormal; Notable for the following components:      Result Value   RDW 16.0 (*)    Platelets 437 (*)    All other components within normal limits  COMPREHENSIVE METABOLIC PANEL - Abnormal;  Notable for the following components:   Creatinine, Ser 1.46 (*)    AST 62 (*)    ALT 45 (*)    Alkaline Phosphatase 36 (*)    GFR, Estimated 41 (*)    All other components within normal limits  TROPONIN I (HIGH SENSITIVITY)  TROPONIN I (HIGH SENSITIVITY)    EKG EKG Interpretation Date/Time:  Thursday January 12 2023 11:39:39 EDT Ventricular Rate:  84  PR Interval:  129 QRS Duration:  95 QT Interval:  363 QTC Calculation: 430 R Axis:   33  Text Interpretation: Sinus rhythm Biatrial enlargement Borderline T abnormalities, lateral leads since last tracing no significant change Confirmed by Eber Hong ([id number]) on 01/12/2023 11:41:52 AM  Radiology DG Chest 2 View  Result Date: 01/12/2023 CLINICAL DATA:  Chest pain.  Vomiting. EXAM: CHEST - 2 VIEW COMPARISON:  09/29/2022 FINDINGS: Stable appearance of the left chest ICD. Both lungs are clear. Heart size is normal. Trachea is midline. No large effusions. No acute bone abnormality. IMPRESSION: No acute cardiopulmonary disease. Electronically Signed   By: Richarda Overlie M.D.   On: 01/12/2023 14:14    Procedures Procedures    Medications Ordered in ED Medications  ondansetron (ZOFRAN) injection 4 mg (4 mg Intravenous Given 01/12/23 1218)  sodium chloride 0.9 % bolus 250 mL (0 mLs Intravenous Stopped 01/12/23 1317)    ED Course/ Medical Decision Making/ A&P                                 Medical Decision Making Amount and/or Complexity of Data Reviewed Labs: ordered. Radiology: ordered.  Risk Prescription drug management.   Upon assessment, patient is resting in bed comfortably in no physical signs of distress planing of chest heaviness.  EKG interpreted by Dr. Hyacinth Meeker shows sinus rhythm with no significant change from previous. 2 tropes ordered and negative making cardiac etiology for symptoms unlikely. Labs are not significant for acute abnormality or electrolyte deficiencies.  Mildly elevated creatinine from baseline (1.46), 250  NS bolus given for creatinine and suspected dehydration secondary to prolonged vomiting. Chest x-ray negative for air and mediastinum and unlikely to have Boerhaave syndrome.  Patient denies hemoptysis.  No acute pathology or effusion indicate acute cardiopulmonary disease as etiology of patient's symptoms.  Zofran given for nausea but no active vomiting noted in ED. Patient is currently following GI outpatient who have no recommendations or explanation of vomiting and negative MRI of abdomen, will have patient follow-up with GI regarding vomiting.  Scopolamine patch is prescribed for nausea.  Treatment and disposition plan explained to patient who expresses understanding and agrees with plan.  Patient is stable for discharge. Return precautions to include but not limited to worsening of symptoms, syncope, dizziness provided.  Assessment and plan discussed with Dr. Hyacinth Meeker who agrees with plan.        Final Clinical Impression(s) / ED Diagnoses Final diagnoses:  Chest pain, unspecified type  Nausea and vomiting, unspecified vomiting type    Rx / DC Orders ED Discharge Orders          Ordered    scopolamine (TRANSDERM-SCOP) 1 MG/3DAYS  every 72 hours        01/12/23 1500              Judithann Sheen, PA 01/12/23 1534    Eber Hong, MD 01/15/23 [id number]

## 2023-01-12 NOTE — ED Triage Notes (Signed)
Pt reports n/v that started in July. Pt reports that after vomiting this morning she developed chest pain. Pt deneis any new SHOB.

## 2023-01-12 NOTE — ED Notes (Signed)
See triage notes. Pt resting. States has had constant n/v x 10 days. Hasn't eaten or drank x 5 days. Pt mm moist. No lethargy noted. Nad.

## 2023-01-15 ENCOUNTER — Other Ambulatory Visit: Payer: Self-pay | Admitting: Internal Medicine

## 2023-01-15 DIAGNOSIS — G6289 Other specified polyneuropathies: Secondary | ICD-10-CM

## 2023-01-16 ENCOUNTER — Ambulatory Visit (INDEPENDENT_AMBULATORY_CARE_PROVIDER_SITE_OTHER): Payer: Medicare PPO | Admitting: Orthopedic Surgery

## 2023-01-16 DIAGNOSIS — M7521 Bicipital tendinitis, right shoulder: Secondary | ICD-10-CM

## 2023-01-16 DIAGNOSIS — G8929 Other chronic pain: Secondary | ICD-10-CM | POA: Diagnosis not present

## 2023-01-16 DIAGNOSIS — M19011 Primary osteoarthritis, right shoulder: Secondary | ICD-10-CM

## 2023-01-16 DIAGNOSIS — M7522 Bicipital tendinitis, left shoulder: Secondary | ICD-10-CM

## 2023-01-16 DIAGNOSIS — M25512 Pain in left shoulder: Secondary | ICD-10-CM

## 2023-01-16 DIAGNOSIS — M19012 Primary osteoarthritis, left shoulder: Secondary | ICD-10-CM

## 2023-01-16 DIAGNOSIS — M25511 Pain in right shoulder: Secondary | ICD-10-CM | POA: Diagnosis not present

## 2023-01-17 ENCOUNTER — Encounter: Payer: Self-pay | Admitting: Orthopedic Surgery

## 2023-01-17 NOTE — Progress Notes (Signed)
Office Visit Note   Patient: Erin [name]           Date of Birth: 12/15/1960           MRN: [id number] Visit Date: 01/16/2023 Requested by: Tarry Kos, MD [address] [place],  [place] [id number] PCP: Billie Lade, MD  Subjective: Chief Complaint  Patient presents with   Other     Bilateral shoulder pain    HPI: Erin [name] is a 62 y.o. female who presents to the office reporting bilateral shoulder pain.  Left is worse than right.  She is right-hand dominant.  Has had chronic pain for 3 years.  Pain does radiate down to the elbow with occasional numbness and tingling in her fingers.  Denies any neck pain but does report some scapular muscle spasm and tenderness.  The pain wakes her from sleep at night.  She has to have help with ADLs including fixing her hair and getting dressed.  She had a defibrillator placed in 2020.  EF is around 30 to 35%.  The defibrillator has never gone off.  She did have an injection last year which helped her for several days..                ROS: All systems reviewed are negative as they relate to the chief complaint within the history of present illness.  Patient denies fevers or chills.  Assessment & Plan: Visit Diagnoses:  1. Chronic pain of both shoulders   2. Left shoulder pain, unspecified chronicity   3. Bilateral shoulder region arthritis   4. Biceps tendinitis of both upper extremities     Plan: Impression is severe bilateral shoulder arthritis left worse than right.  MRI scan does show severe left-sided glenohumeral joint arthritis with rotator cuff tendinosis and biceps tendon tendinosis.  Plan at this time is we had a long discussion about reverse shoulder replacement.  With the use of models the rationale of reverse shoulder replacement is discussed along with patient specific instrumentation.  The risk and benefits were also discussed including but not limited to infection nerve and vessel damage incomplete pain relief as  well as incomplete restoration of function.  Extensive nature of the rehabilitative process is also discussed.  She would need to undergo cardiac risk stratification in order to optimize her perioperative medical condition to diminish chances of complication.  Patient understands the risk and benefits and wishes to proceed.  All questions answered.  Follow-Up Instructions: No follow-ups on file.   Orders:  Orders Placed This Encounter  Procedures   CT SHOULDER LEFT WO CONTRAST   No orders of the defined types were placed in this encounter.     Procedures: No procedures performed   Clinical Data: No additional findings.  Objective: Vital Signs: There were no vitals taken for this visit.  Physical Exam:  Constitutional: Patient appears well-developed HEENT:  Head: Normocephalic Eyes:EOM are normal Neck: Normal range of motion Cardiovascular: Normal rate Pulmonary/chest: Effort normal Neurologic: Patient is alert Skin: Skin is warm Psychiatric: Patient has normal mood and affect  Ortho Exam: Ortho exam demonstrates good cervical spine range of motion.  Passive range of motion on the right is 30/80/120.  Passive range of motion on the left is 40/80/120.  Coarse grinding and crepitus is present with passive range of motion more on the left compared to the right.  There is sensory function to the hand is intact.  Radial pulses intact.  No pitting edema  bilateral lower extremities.  Specialty Comments:  No specialty comments available.  Imaging: No results found.   PMFS History: Patient Active Problem List   Diagnosis Date Noted   RUQ pain 06/29/2022   Pancreatic cyst 06/29/2022   Peripheral neuropathy 05/04/2022   Restless leg syndrome 05/04/2022   Hypertriglyceridemia 05/04/2022   Bilateral shoulder pain 05/04/2022   Encounter for general adult medical examination with abnormal findings 05/04/2022   H/O total hysterectomy 04/26/2022   Odynophagia 04/20/2022    Abdominal pain, epigastric 04/20/2022   Nausea without vomiting 06/16/2021   Allergy to alpha-gal 06/16/2021   Orthostatic hypotension 06/16/2021   Hypocortisolemia (HCC) 03/02/2021   Adrenal adenoma, left 03/02/2021   AKI (acute kidney injury) (HCC) 01/04/2021   Intractable nausea and vomiting 01/03/2021   Left-sided weakness 10/19/2020   Cough 02/11/2020   Near syncope 12/10/2019   Brain aneurysm 04/15/2019   Dysphagia 02/27/2018   Encounter for screening colonoscopy 02/27/2018   History of Clostridium difficile infection 02/27/2018   Chronic diarrhea 12/20/2017   Chronic combined systolic and diastolic congestive heart failure (HCC) 06/20/2016   Hypokalemia due to excessive gastrointestinal loss of potassium    Acute CHF (congestive heart failure) (HCC) 05/16/2016   Acute on chronic systolic CHF (congestive heart failure) (HCC) 05/16/2016   Acute respiratory failure with hypoxia (HCC)    Thrombocytosis 03/26/2016   Cardiomyopathy, ischemic 03/25/2016   Chronic combined systolic and diastolic heart failure (HCC) 03/25/2016   Anxiety state 03/25/2016   Troponin level elevated 03/25/2016   Coronary artery disease involving native coronary artery of native heart 03/25/2016   Normocytic anemia 03/25/2016   SOB (shortness of breath) 03/24/2016   Lightheadedness 03/17/2016   Hypotension 03/17/2016   Tobacco abuse 03/12/2016   NSTEMI (non-ST elevated myocardial infarction) (HCC) 03/11/2016   Atypical chest pain    Essential hypertension 09/06/2015   Hyperlipidemia 09/06/2015   GERD without esophagitis 09/06/2015   Chest pain 09/06/2015   Past Medical History:  Diagnosis Date   AICD (automatic cardioverter/defibrillator) present    Allergic reaction to alpha-gal    Allergy 03/05/2022   Anemia    Anxiety state 03/25/2016   Arthritis    Basal cell carcinoma of forehead    Brain aneurysm    CHF (congestive heart failure) (HCC)    Coronary artery disease    a. 03/11/16 PCI  with DES-->Prox/Mid Cx;  b. 03/14/16 PCI with DES x2-->RCA, EF 30-35%.   Depression 06/09/2010   Encounter for general adult medical examination with abnormal findings 05/04/2022   Essential hypertension    GERD (gastroesophageal reflux disease)    HFrEF (heart failure with reduced ejection fraction) (HCC)    a. 10/2016 Echo: EF 35-40%, Gr1 DD, mild focal basal septal hypertrophy, basal inflat, mid inflat, basal antlat AK. Mid infept/inf/antlat, apical lateral sev HK. Mod MR. mildly reduced RV fxn. Mild TR.   History of pneumonia    Hyperlipidemia    IBS (irritable bowel syndrome)    Ischemic cardiomyopathy    a. 10/2016 Echo: EF 35-40%, Gr1 DD.   Mitral regurgitation    Neuromuscular disorder (HCC)    NSTEMI (non-ST elevated myocardial infarction) (HCC) 03/10/2016   Pneumonia 03/2016   Shingles    Squamous cell cancer of skin of nose    Thrombocytosis 03/26/2016   Tobacco abuse    Trichimoniasis    Wears dentures    Wears glasses     Family History  Problem Relation Age of Onset   Stroke Mother  Hypertension Mother    Diabetes Mother    Heart attack Mother    Heart attack Father    Diabetes Father    Hypertension Father    CAD Father    Colon polyps Father 75       pre-cancerous    Stroke Father    Dementia Father    Hyperlipidemia Father    Arthritis Father    COPD Father    Heart disease Father    Breast cancer Maternal Grandmother    Diabetes Maternal Grandmother    Cancer Maternal Grandfather        Tongue and esophageal   Anxiety disorder Daughter    Depression Daughter    Anxiety disorder Daughter    Heart failure Other    Cancer Paternal Grandmother    Colon cancer Neg Hx     Past Surgical History:  Procedure Laterality Date   APPENDECTOMY     BIOPSY  09/20/2018   Procedure: BIOPSY;  Surgeon: Corbin Ade, MD;  Location: [facility];  Service: Endoscopy;;  colon   BIOPSY  01/05/2021   Procedure: BIOPSY;  Surgeon: Rachael Fee, MD;  Location:  [facility] ENDOSCOPY;  Service: Endoscopy;;   BIOPSY  04/27/2022   Procedure: BIOPSY;  Surgeon: Lanelle Bal, DO;  Location: [facility];  Service: Endoscopy;;   CARDIAC CATHETERIZATION N/A 03/11/2016   Procedure: Left Heart Cath and Coronary Angiography;  Surgeon: Marykay Lex, MD;  Location: [facility] INVASIVE CV LAB;  Service: Cardiovascular;  Laterality: N/A;   CARDIAC CATHETERIZATION N/A 03/11/2016   Procedure: Coronary Stent Intervention;  Surgeon: Marykay Lex, MD;  Location: [facility] INVASIVE CV LAB;  Service: Cardiovascular;  Laterality: N/A;   CARDIAC CATHETERIZATION N/A 03/14/2016   Procedure: Coronary Stent Intervention;  Surgeon: Peter M Swaziland, MD;  Location: [facility] INVASIVE CV LAB;  Service: Cardiovascular;  Laterality: N/A;   CEREBRAL ANEURYSM REPAIR  04/2019   stent placed   CHOLECYSTECTOMY OPEN  1984   COLONOSCOPY WITH PROPOFOL N/A 09/20/2018   Procedure: COLONOSCOPY WITH PROPOFOL;  Surgeon: Corbin Ade, MD;  Location: [facility];  Service: Endoscopy;  Laterality: N/A;  10:30am   CORONARY ANGIOPLASTY WITH STENT PLACEMENT  03/14/2016   ESOPHAGOGASTRODUODENOSCOPY (EGD) WITH PROPOFOL N/A 09/20/2018   Procedure: ESOPHAGOGASTRODUODENOSCOPY (EGD) WITH PROPOFOL;  Surgeon: Corbin Ade, MD;  [facility]: [facility];  Service: Endoscopy;  Laterality: N/A;   ESOPHAGOGASTRODUODENOSCOPY (EGD) WITH PROPOFOL N/A 01/05/2021   Procedure: ESOPHAGOGASTRODUODENOSCOPY (EGD) WITH PROPOFOL;  Surgeon: Rachael Fee, MD;  Location[facility] ENDOSCOPY;  Service: Endoscopy;  Laterality: N/A;   ESOPHAGOGASTRODUODENOSCOPY (EGD) WITH PROPOFOL N/A 04/27/2022   Procedure: ESOPHAGOGASTRODUODENOSCOPY (EGD) WITH PROPOFOL;  Surgeon: Lanelle Bal, DO;  Location: [facility];  Service: Endoscopy;  Laterality: N/A;  9:15am, asa 3/4, ASAP   ESOPHAGOGASTRODUODENOSCOPY (EGD) WITH PROPOFOL N/A 01/04/2023   Procedure: ESOPHAGOGASTRODUODENOSCOPY (EGD) WITH PROPOFOL;  Surgeon: Corbin Ade, MD;  [facility];  Service: Endoscopy;  Laterality: N/A;  130pm, asa 3   FINGER ARTHROPLASTY Left 05/14/2013   Procedure: LEFT THUMB CARPAL METACARPAL ARTHROPLASTY;  Surgeon: Tami Ribas, MD;  Location: [facility];  Service: Orthopedics;  Laterality: Left;   ICD IMPLANT N/A 04/03/2020   Procedure: ICD IMPLANT;  Surgeon: Regan Lemming, MD;  Location: [facility] INVASIVE CV LAB;  Service: Cardiovascular;  Laterality: N/A;   IR ANGIO INTRA EXTRACRAN SEL COM CAROTID INNOMINATE BILAT MOD SED  01/05/2017   IR ANGIO INTRA EXTRACRAN  SEL COM CAROTID INNOMINATE BILAT MOD SED  03/19/2019   IR ANGIO INTRA EXTRACRAN SEL COM CAROTID INNOMINATE BILAT MOD SED  06/04/2020   IR ANGIO INTRA EXTRACRAN SEL INTERNAL CAROTID UNI L MOD SED  04/15/2019   IR ANGIO VERTEBRAL SEL VERTEBRAL BILAT MOD SED  01/05/2017   IR ANGIO VERTEBRAL SEL VERTEBRAL BILAT MOD SED  03/19/2019   IR ANGIO VERTEBRAL SEL VERTEBRAL UNI L MOD SED  06/04/2020   IR ANGIOGRAM FOLLOW UP STUDY  04/15/2019   IR RADIOLOGIST EVAL & MGMT  12/30/2016   IR TRANSCATH/EMBOLIZ  04/15/2019   IR US GUIDE VASC ACCESS RIGHT  03/19/2019   IR US GUIDE VASC ACCESS RIGHT  06/04/2020   JOINT REPLACEMENT  05-15-2013   MALONEY DILATION N/A 09/20/2018   Procedure: Elease Hashimoto DILATION;  Surgeon: Corbin Ade, MD;  Location: [facility];  Service: Endoscopy;  Laterality: N/A;   MALONEY DILATION N/A 01/04/2023   Procedure: Elease Hashimoto DILATION;  Surgeon: Corbin Ade, MD;  Location: [facility];  Service: Endoscopy;  Laterality: N/A;   RADIOLOGY WITH ANESTHESIA N/A 04/15/2019   Procedure: Sharman Crate;  Surgeon: Julieanne Cotton, MD;  Location: [facility] OR;  Service: Radiology;  Laterality: N/A;   RIGHT/LEFT HEART CATH AND CORONARY ANGIOGRAPHY N/A 08/19/2019   Procedure: RIGHT/LEFT HEART CATH AND CORONARY ANGIOGRAPHY;  Surgeon: Laurey Morale, MD;  Location: [facility] INVASIVE CV LAB;  Service: Cardiovascular;  Laterality: N/A;   TUBAL LIGATION  1987   VAGINAL HYSTERECTOMY   2009   Social History   Occupational History   Occupation: CNA  Tobacco Use   Smoking status: Every Day    Current packs/day: 0.25    Average packs/day: 0.3 packs/day for 15.0 years (3.8 ttl pk-yrs)    Types: Cigarettes   Smokeless tobacco: Never   Tobacco comments:    smokes a cigarette occasionally  Vaping Use   Vaping status: Never Used  Substance and Sexual Activity   Alcohol use: Yes    Comment: occasionally   Drug use: Not Currently    Types: Marijuana    Comment: former- 2017 last time   Sexual activity: Not Currently    Birth control/protection: Surgical    Comment: hyst

## 2023-01-18 ENCOUNTER — Ambulatory Visit
Admission: RE | Admit: 2023-01-18 | Discharge: 2023-01-18 | Disposition: A | Discharge status: Home / Self Care | Payer: Medicare PPO | Source: Ambulatory Visit | Attending: Orthopedic Surgery | Admitting: Orthopedic Surgery

## 2023-01-18 DIAGNOSIS — M19012 Primary osteoarthritis, left shoulder: Secondary | ICD-10-CM | POA: Diagnosis not present

## 2023-01-18 DIAGNOSIS — M25512 Pain in left shoulder: Secondary | ICD-10-CM | POA: Diagnosis not present

## 2023-01-24 ENCOUNTER — Emergency Department (HOSPITAL_COMMUNITY)
Admission: EM | Admit: 2023-01-24 | Discharge: 2023-01-24 | Disposition: A | Discharge status: Home / Self Care | Payer: Medicare PPO | Attending: Student | Admitting: Student

## 2023-01-24 ENCOUNTER — Ambulatory Visit (INDEPENDENT_AMBULATORY_CARE_PROVIDER_SITE_OTHER): Payer: Medicare PPO | Admitting: Internal Medicine

## 2023-01-24 ENCOUNTER — Other Ambulatory Visit: Payer: Self-pay

## 2023-01-24 ENCOUNTER — Encounter (HOSPITAL_COMMUNITY): Payer: Self-pay | Admitting: *Deleted

## 2023-01-24 ENCOUNTER — Encounter: Payer: Self-pay | Admitting: Internal Medicine

## 2023-01-24 VITALS — BP 63/44 | HR 90 | Temp 98.7°F | Ht 60.0 in | Wt 113.8 lb

## 2023-01-24 DIAGNOSIS — F17210 Nicotine dependence, cigarettes, uncomplicated: Secondary | ICD-10-CM | POA: Insufficient documentation

## 2023-01-24 DIAGNOSIS — I5020 Unspecified systolic (congestive) heart failure: Secondary | ICD-10-CM | POA: Insufficient documentation

## 2023-01-24 DIAGNOSIS — Z85828 Personal history of other malignant neoplasm of skin: Secondary | ICD-10-CM | POA: Diagnosis not present

## 2023-01-24 DIAGNOSIS — E860 Dehydration: Secondary | ICD-10-CM

## 2023-01-24 DIAGNOSIS — I110 Hypertensive heart disease with heart failure: Secondary | ICD-10-CM | POA: Insufficient documentation

## 2023-01-24 DIAGNOSIS — R634 Abnormal weight loss: Secondary | ICD-10-CM

## 2023-01-24 DIAGNOSIS — R110 Nausea: Secondary | ICD-10-CM | POA: Diagnosis not present

## 2023-01-24 DIAGNOSIS — R42 Dizziness and giddiness: Secondary | ICD-10-CM | POA: Diagnosis not present

## 2023-01-24 DIAGNOSIS — I951 Orthostatic hypotension: Secondary | ICD-10-CM | POA: Insufficient documentation

## 2023-01-24 DIAGNOSIS — R7989 Other specified abnormal findings of blood chemistry: Secondary | ICD-10-CM | POA: Insufficient documentation

## 2023-01-24 DIAGNOSIS — Z7902 Long term (current) use of antithrombotics/antiplatelets: Secondary | ICD-10-CM | POA: Insufficient documentation

## 2023-01-24 DIAGNOSIS — Z79899 Other long term (current) drug therapy: Secondary | ICD-10-CM | POA: Diagnosis not present

## 2023-01-24 DIAGNOSIS — Z7982 Long term (current) use of aspirin: Secondary | ICD-10-CM | POA: Diagnosis not present

## 2023-01-24 DIAGNOSIS — I2510 Atherosclerotic heart disease of native coronary artery without angina pectoris: Secondary | ICD-10-CM | POA: Diagnosis not present

## 2023-01-24 LAB — COMPREHENSIVE METABOLIC PANEL
ALT: 24 U/L (ref 0–44)
AST: 27 U/L (ref 15–41)
Albumin: 3.8 g/dL (ref 3.5–5.0)
Alkaline Phosphatase: 32 U/L — ABNORMAL LOW (ref 38–126)
Anion gap: 9 (ref 5–15)
BUN: 18 mg/dL (ref 6–20)
CO2: 24 mmol/L (ref 22–32)
Calcium: 9.5 mg/dL (ref 8.9–10.3)
Chloride: 104 mmol/L (ref 98–111)
Creatinine, Ser: 1.62 mg/dL — ABNORMAL HIGH (ref 0.44–1.00)
GFR, Estimated: 36 mL/min — ABNORMAL LOW (ref >60)
Glucose, Bld: 109 mg/dL — ABNORMAL HIGH (ref 70–99)
Potassium: 3.8 mmol/L (ref 3.5–5.1)
Sodium: 137 mmol/L (ref 135–145)
Total Bilirubin: 0.6 mg/dL (ref 0.3–1.2)
Total Protein: 6.7 g/dL (ref 6.5–8.1)

## 2023-01-24 LAB — CBC WITH DIFFERENTIAL/PLATELET
Abs Immature Granulocytes: 0.05 10*3/uL (ref 0.00–0.07)
Basophils Absolute: 0.1 10*3/uL (ref 0.0–0.1)
Basophils Relative: 1 %
Eosinophils Absolute: 0.1 10*3/uL (ref 0.0–0.5)
Eosinophils Relative: 2 %
HCT: 37.3 % (ref 36.0–46.0)
Hemoglobin: 12.4 g/dL (ref 12.0–15.0)
Immature Granulocytes: 1 %
Lymphocytes Relative: 25 %
Lymphs Abs: 2 10*3/uL (ref 0.7–4.0)
MCH: 27.6 pg (ref 26.0–34.0)
MCHC: 33.2 g/dL (ref 30.0–36.0)
MCV: 83.1 fL (ref 80.0–100.0)
Monocytes Absolute: 0.6 10*3/uL (ref 0.1–1.0)
Monocytes Relative: 7 %
Neutro Abs: 5.1 10*3/uL (ref 1.7–7.7)
Neutrophils Relative %: 64 %
Platelets: 402 10*3/uL — ABNORMAL HIGH (ref 150–400)
RBC: 4.49 MIL/uL (ref 3.87–5.11)
RDW: 17.2 % — ABNORMAL HIGH (ref 11.5–15.5)
WBC: 7.9 10*3/uL (ref 4.0–10.5)
nRBC: 0 % (ref 0.0–0.2)

## 2023-01-24 LAB — TSH: TSH: 0.702 u[IU]/mL (ref 0.350–4.500)

## 2023-01-24 LAB — LACTIC ACID, PLASMA: Lactic Acid, Venous: 1.3 mmol/L (ref 0.5–1.9)

## 2023-01-24 MED ORDER — PROCHLORPERAZINE EDISYLATE 10 MG/2ML IJ SOLN
10.0000 mg | Freq: Once | INTRAMUSCULAR | Status: AC
  Administered 2023-01-24: 10 mg via INTRAVENOUS
  Filled 2023-01-24: qty 2

## 2023-01-24 MED ORDER — DIPHENHYDRAMINE HCL 50 MG/ML IJ SOLN
25.0000 mg | Freq: Once | INTRAMUSCULAR | Status: AC
  Administered 2023-01-24: 25 mg via INTRAVENOUS
  Filled 2023-01-24: qty 1

## 2023-01-24 MED ORDER — LACTATED RINGERS IV BOLUS
1000.0000 mL | Freq: Once | INTRAVENOUS | Status: AC
  Administered 2023-01-24: 1000 mL via INTRAVENOUS

## 2023-01-24 NOTE — ED Provider Notes (Signed)
[place] EMERGENCY DEPARTMENT AT [facility] Provider Note  CSN: [id number] Arrival date & time: 01/24/23 1546  Chief Complaint(s) Hypotension  HPI Erin [name] is a 62 y.o. female with PMH peripheral ureteral disease, CAD, ischemic cardiomyopathy with EF 30 to 35%, P-comm aneurysm status post coiling, chronic nausea who presents emergency department for evaluation of dizziness and hypotension.  Patient states that she has had decreased p.o. intake over the last 2 days because of her nausea and was seen in gastroenterology clinic this morning and found to have a systolic blood pressure in the 60s.  Was experiencing orthostatic dizziness in the office.  Here in the emergency room, she arrives hypotensive with blood pressure 82/58.  She denies chest pain, shortness of breath, headache, fever or other systemic symptoms.  Denies lower extremity edema.  Has been compliant with her Lasix therapy.   Past Medical History Past Medical History:  Diagnosis Date   AICD (automatic cardioverter/defibrillator) present    Allergic reaction to alpha-gal    Allergy 03/05/2022   Anemia    Anxiety state 03/25/2016   Arthritis    Basal cell carcinoma of forehead    Brain aneurysm    CHF (congestive heart failure) (HCC)    Coronary artery disease    a. 03/11/16 PCI with DES-->Prox/Mid Cx;  b. 03/14/16 PCI with DES x2-->RCA, EF 30-35%.   Depression 06/09/2010   Encounter for general adult medical examination with abnormal findings 05/04/2022   Essential hypertension    GERD (gastroesophageal reflux disease)    HFrEF (heart failure with reduced ejection fraction) (HCC)    a. 10/2016 Echo: EF 35-40%, Gr1 DD, mild focal basal septal hypertrophy, basal inflat, mid inflat, basal antlat AK. Mid infept/inf/antlat, apical lateral sev HK. Mod MR. mildly reduced RV fxn. Mild TR.   History of pneumonia    Hyperlipidemia    IBS (irritable bowel syndrome)    Ischemic cardiomyopathy    a. 10/2016 Echo: EF  35-40%, Gr1 DD.   Mitral regurgitation    Neuromuscular disorder (HCC)    NSTEMI (non-ST elevated myocardial infarction) (HCC) 03/10/2016   Pneumonia 03/2016   Shingles    Squamous cell cancer of skin of nose    Thrombocytosis 03/26/2016   Tobacco abuse    Trichimoniasis    Wears dentures    Wears glasses    Patient Active Problem List   Diagnosis Date Noted   RUQ pain 06/29/2022   Pancreatic cyst 06/29/2022   Peripheral neuropathy 05/04/2022   Restless leg syndrome 05/04/2022   Hypertriglyceridemia 05/04/2022   Bilateral shoulder pain 05/04/2022   Encounter for general adult medical examination with abnormal findings 05/04/2022   H/O total hysterectomy 04/26/2022   Odynophagia 04/20/2022   Abdominal pain, epigastric 04/20/2022   Nausea without vomiting 06/16/2021   Allergy to alpha-gal 06/16/2021   Orthostatic hypotension 06/16/2021   Hypocortisolemia (HCC) 03/02/2021   Adrenal adenoma, left 03/02/2021   AKI (acute kidney injury) (HCC) 01/04/2021   Intractable nausea and vomiting 01/03/2021   Left-sided weakness 10/19/2020   Cough 02/11/2020   Near syncope 12/10/2019   Brain aneurysm 04/15/2019   Dysphagia 02/27/2018   Encounter for screening colonoscopy 02/27/2018   History of Clostridium difficile infection 02/27/2018   Chronic diarrhea 12/20/2017   Chronic combined systolic and diastolic congestive heart failure (HCC) 06/20/2016   Hypokalemia due to excessive gastrointestinal loss of potassium    Acute CHF (congestive heart failure) (HCC) 05/16/2016   Acute on chronic systolic CHF (congestive heart failure) (  HCC) 05/16/2016   Acute respiratory failure with hypoxia (HCC)    Thrombocytosis 03/26/2016   Cardiomyopathy, ischemic 03/25/2016   Chronic combined systolic and diastolic heart failure (HCC) 03/25/2016   Anxiety state 03/25/2016   Troponin level elevated 03/25/2016   Coronary artery disease involving native coronary artery of native heart 03/25/2016    Normocytic anemia 03/25/2016   SOB (shortness of breath) 03/24/2016   Lightheadedness 03/17/2016   Hypotension 03/17/2016   Tobacco abuse 03/12/2016   NSTEMI (non-ST elevated myocardial infarction) (HCC) 03/11/2016   Atypical chest pain    Essential hypertension 09/06/2015   Hyperlipidemia 09/06/2015   GERD without esophagitis 09/06/2015   Chest pain 09/06/2015   Home Medication(s) Prior to Admission medications   Medication Sig Start Date End Date Taking? Authorizing Provider  aspirin EC 81 MG tablet Take 81 mg by mouth every evening.     [provider]  atorvastatin (LIPITOR) 80 MG tablet Take 1 tablet (80 mg total) by mouth daily. 09/05/22   Laurey Morale, MD  clopidogrel (PLAVIX) 75 MG tablet Take 1 tablet by mouth once daily 07/12/22   Laurey Morale, MD  dapagliflozin propanediol (FARXIGA) 10 MG TABS tablet Take 1 tablet (10 mg total) by mouth daily before breakfast. 09/12/22   Milford, Anderson Malta, FNP  EPINEPHrine (EPIPEN 2-PAK) 0.3 mg/0.3 mL IJ SOAJ injection Inject 0.3 mg into the muscle as needed for anaphylaxis. 04/23/21   Alfonse Spruce, MD  Evolocumab (REPATHA SURECLICK) 140 MG/ML SOAJ Inject 140 mg into the skin every 14 (fourteen) days. 09/07/22   Laurey Morale, MD  fenofibrate (TRICOR) 145 MG tablet Take 1 tablet (145 mg total) by mouth daily. 07/27/22   Laurey Morale, MD  furosemide (LASIX) 20 MG tablet Take 1 tablet (20 mg total) by mouth daily. 06/13/22   Laurey Morale, MD  gabapentin (NEURONTIN) 300 MG capsule Take 1 capsule by mouth at bedtime 01/16/23   Billie Lade, MD  linaclotide [facility]) 145 MCG CAPS capsule Take 1 capsule (145 mcg total) by mouth daily before breakfast. 11/22/22   Rourk, Gerrit Friends, MD  losartan (COZAAR) 25 MG tablet Take 1 tablet (25 mg total) by mouth at bedtime. 09/12/22   Milford, Anderson Malta, FNP  metoprolol succinate (TOPROL XL) 25 MG 24 hr tablet Take 1 tablet (25 mg total) by mouth daily. 12/19/22   Laurey Morale, MD   Multiple Vitamins-Minerals (MULTIVITAMIN WITH MINERALS) tablet Take 1 tablet by mouth daily.    [provider]  nitroGLYCERIN (NITROSTAT) 0.4 MG SL tablet Place 1 tablet (0.4 mg total) under the tongue every 5 (five) minutes x 3 doses as needed for chest pain (if no relief after 2nd dose, proceed to the ED for an evaluation or call 911). 09/19/22   Laurey Morale, MD  ondansetron (ZOFRAN) 4 MG tablet Take 1 tablet (4 mg total) by mouth every 6 (six) hours as needed for nausea or vomiting. 11/23/22   Rourk, Gerrit Friends, MD  pantoprazole (PROTONIX) 40 MG tablet TAKE 1 TABLET BY MOUTH TWICE DAILY BEFORE A MEAL 10/17/22   Gelene Mink, NP  potassium chloride SA (KLOR-CON M) 20 MEQ tablet Take 1 tablet (20 mEq total) by mouth 2 (two) times daily. 07/11/22   Laurey Morale, MD  rOPINIRole (REQUIP) 0.5 MG tablet Take 0.5 mg by mouth at bedtime.  11/13/17   [provider]  scopolamine (TRANSDERM-SCOP) 1 MG/3DAYS Place 1 patch (1.5 mg total) onto the skin every  3 (three) days. 01/12/23   Judithann Sheen, PA  spironolactone (ALDACTONE) 25 MG tablet Take 1 tablet (25 mg total) by mouth at bedtime. 03/07/22   Laurey Morale, MD  sucralfate (CARAFATE) 1 GM/10ML suspension Take 10 mLs (1 g total) by mouth 4 (four) times daily. 04/27/22 04/27/23  Lanelle Bal, DO  traMADol (ULTRAM) 50 MG tablet Take 1 tablet (50 mg total) by mouth 3 (three) times daily as needed. 07/27/22   Cristie Hem, PA-C  varenicline (CHANTIX) 1 MG tablet Take 1 tablet (1 mg total) by mouth 2 (two) times daily. 09/12/22   Milford, Anderson Malta, FNP  [name] 1 g capsule Take 2 capsules (2 g total) by mouth 2 (two) times daily. 08/18/22   Laurey Morale, MD                                                                                                                                    Past Surgical History Past Surgical History:  Procedure Laterality Date   APPENDECTOMY     BIOPSY  09/20/2018   Procedure: BIOPSY;   Surgeon: Corbin Ade, MD;  Location: [facility];  Service: Endoscopy;;  colon   BIOPSY  01/05/2021   Procedure: BIOPSY;  Surgeon: Rachael Fee, MD;  Location: [facility] ENDOSCOPY;  Service: Endoscopy;;   BIOPSY  04/27/2022   Procedure: BIOPSY;  Surgeon: Lanelle Bal, DO;  Location: [facility];  Service: Endoscopy;;   CARDIAC CATHETERIZATION N/A 03/11/2016   Procedure: Left Heart Cath and Coronary Angiography;  Surgeon: Marykay Lex, MD;  Location: [facility] INVASIVE CV LAB;  Service: Cardiovascular;  Laterality: N/A;   CARDIAC CATHETERIZATION N/A 03/11/2016   Procedure: Coronary Stent Intervention;  Surgeon: Marykay Lex, MD;  Location: [facility] INVASIVE CV LAB;  Service: Cardiovascular;  Laterality: N/A;   CARDIAC CATHETERIZATION N/A 03/14/2016   Procedure: Coronary Stent Intervention;  Surgeon: Peter M Swaziland, MD;  Location: [facility] INVASIVE CV LAB;  Service: Cardiovascular;  Laterality: N/A;   CEREBRAL ANEURYSM REPAIR  04/2019   stent placed   CHOLECYSTECTOMY OPEN  1984   COLONOSCOPY WITH PROPOFOL N/A 09/20/2018   Procedure: COLONOSCOPY WITH PROPOFOL;  Surgeon: Corbin Ade, MD;  Location: [facility];  Service: Endoscopy;  Laterality: N/A;  10:30am   CORONARY ANGIOPLASTY WITH STENT PLACEMENT  03/14/2016   ESOPHAGOGASTRODUODENOSCOPY (EGD) WITH PROPOFOL N/A 09/20/2018   Procedure: ESOPHAGOGASTRODUODENOSCOPY (EGD) WITH PROPOFOL;  Surgeon: Corbin Ade, MD;  [facility]: [facility];  Service: Endoscopy;  Laterality: N/A;   ESOPHAGOGASTRODUODENOSCOPY (EGD) WITH PROPOFOL N/A 01/05/2021   Procedure: ESOPHAGOGASTRODUODENOSCOPY (EGD) WITH PROPOFOL;  Surgeon: Rachael Fee, MD;  Location: [facility] ENDOSCOPY;  Service: Endoscopy;  Laterality: N/A;   ESOPHAGOGASTRODUODENOSCOPY (EGD) WITH PROPOFOL N/A 04/27/2022   Procedure: ESOPHAGOGASTRODUODENOSCOPY (EGD) WITH PROPOFOL;  Surgeon: Lanelle Bal, DO;  [facility]: [facility];  Service: Endoscopy;  Laterality: N/A;  9:15am, asa  3/4, ASAP    ESOPHAGOGASTRODUODENOSCOPY (EGD) WITH PROPOFOL N/A 01/04/2023   Procedure: ESOPHAGOGASTRODUODENOSCOPY (EGD) WITH PROPOFOL;  Surgeon: Corbin Ade, MD;  [facility];  Service: Endoscopy;  Laterality: N/A;  130pm, asa 3   FINGER ARTHROPLASTY Left 05/14/2013   Procedure: LEFT THUMB CARPAL METACARPAL ARTHROPLASTY;  Surgeon: Tami Ribas, MD;  Location: [facility];  Service: Orthopedics;  Laterality: Left;   ICD IMPLANT N/A 04/03/2020   Procedure: ICD IMPLANT;  Surgeon: Regan Lemming, MD;  Location: [facility] INVASIVE CV LAB;  Service: Cardiovascular;  Laterality: N/A;   IR ANGIO INTRA EXTRACRAN SEL COM CAROTID INNOMINATE BILAT MOD SED  01/05/2017   IR ANGIO INTRA EXTRACRAN SEL COM CAROTID INNOMINATE BILAT MOD SED  03/19/2019   IR ANGIO INTRA EXTRACRAN SEL COM CAROTID INNOMINATE BILAT MOD SED  06/04/2020   IR ANGIO INTRA EXTRACRAN SEL INTERNAL CAROTID UNI L MOD SED  04/15/2019   IR ANGIO VERTEBRAL SEL VERTEBRAL BILAT MOD SED  01/05/2017   IR ANGIO VERTEBRAL SEL VERTEBRAL BILAT MOD SED  03/19/2019   IR ANGIO VERTEBRAL SEL VERTEBRAL UNI L MOD SED  06/04/2020   IR ANGIOGRAM FOLLOW UP STUDY  04/15/2019   IR RADIOLOGIST EVAL & MGMT  12/30/2016   IR TRANSCATH/EMBOLIZ  04/15/2019   IR US GUIDE VASC ACCESS RIGHT  03/19/2019   IR US GUIDE VASC ACCESS RIGHT  06/04/2020   JOINT REPLACEMENT  05-15-2013   MALONEY DILATION N/A 09/20/2018   Procedure: Elease Hashimoto DILATION;  Surgeon: Corbin Ade, MD;  Location: [facility];  Service: Endoscopy;  Laterality: N/A;   MALONEY DILATION N/A 01/04/2023   Procedure: Elease Hashimoto DILATION;  Surgeon: Corbin Ade, MD;  Location: [facility];  Service: Endoscopy;  Laterality: N/A;   RADIOLOGY WITH ANESTHESIA N/A 04/15/2019   Procedure: Sharman Crate;  Surgeon: Julieanne Cotton, MD;  Location: [facility] OR;  Service: Radiology;  Laterality: N/A;   RIGHT/LEFT HEART CATH AND CORONARY ANGIOGRAPHY N/A 08/19/2019   Procedure: RIGHT/LEFT HEART CATH AND  CORONARY ANGIOGRAPHY;  Surgeon: Laurey Morale, MD;  Location: [facility] INVASIVE CV LAB;  Service: Cardiovascular;  Laterality: N/A;   TUBAL LIGATION  1987   VAGINAL HYSTERECTOMY  2009   Family History Family History  Problem Relation Age of Onset   Stroke Mother    Hypertension Mother    Diabetes Mother    Heart attack Mother    Heart attack Father    Diabetes Father    Hypertension Father    CAD Father    Colon polyps Father 59       pre-cancerous    Stroke Father    Dementia Father    Hyperlipidemia Father    Arthritis Father    COPD Father    Heart disease Father    Breast cancer Maternal Grandmother    Diabetes Maternal Grandmother    Cancer Maternal Grandfather        Tongue and esophageal   Anxiety disorder Daughter    Depression Daughter    Anxiety disorder Daughter    Heart failure Other    Cancer Paternal Grandmother    Colon cancer Neg Hx     Social History Social History   Tobacco Use   Smoking status: Every Day    Current packs/day: 0.25    Average packs/day: 0.3 packs/day for 15.0 years (3.8 ttl pk-yrs)    Types: Cigarettes   Smokeless tobacco: Never   Tobacco comments:    smokes a cigarette occasionally  Vaping Use   Vaping status: Never Used  Substance Use Topics   Alcohol use: Yes    Comment: occasionally   Drug use: Not Currently    Types: Marijuana    Comment: former- 2017 last time   Allergies Alpha-gal, Other, and Tape  Review of Systems Review of Systems  Neurological:  Positive for dizziness and light-headedness.    Physical Exam Vital Signs  I have reviewed the triage vital signs BP (!) 82/58 Comment: standing  Pulse 78   Temp 98.9 F (37.2 C) (Oral)   Resp 16   Ht 5' (1.524 m)   Wt 51.3 kg   SpO2 99%   BMI 22.07 kg/m   Physical Exam Vitals and nursing note reviewed.  Constitutional:      General: She is not in acute distress.    Appearance: She is well-developed.  HENT:     Head: Normocephalic and atraumatic.   Eyes:     Conjunctiva/sclera: Conjunctivae normal.  Cardiovascular:     Rate and Rhythm: Normal rate and regular rhythm.     Heart sounds: No murmur heard. Pulmonary:     Effort: Pulmonary effort is normal. No respiratory distress.     Breath sounds: Normal breath sounds.  Abdominal:     Palpations: Abdomen is soft.     Tenderness: There is no abdominal tenderness.  Musculoskeletal:        General: No swelling.     Cervical back: Neck supple.  Skin:    General: Skin is warm and dry.     Capillary Refill: Capillary refill takes less than 2 seconds.  Neurological:     Mental Status: She is alert.  Psychiatric:        Mood and Affect: Mood normal.     ED Results and Treatments Labs (all labs ordered are listed, but only abnormal results are displayed) Labs Reviewed  CBC WITH DIFFERENTIAL/PLATELET - Abnormal; Notable for the following components:      Result Value   RDW 17.2 (*)    Platelets 402 (*)    All other components within normal limits  COMPREHENSIVE METABOLIC PANEL  LACTIC ACID, PLASMA  LACTIC ACID, PLASMA  TSH                                                                                                                          Radiology No results found.  Pertinent labs & imaging results that were available during my care of the patient were reviewed by me and considered in my medical decision making (see MDM for details).  Medications Ordered in ED Medications  lactated ringers bolus 1,000 mL (1,000 mLs Intravenous New Bag/Given 01/24/23 1643)  Procedures .Critical Care  Performed by: Glendora Score, MD Authorized by: Glendora Score, MD   Critical care provider statement:    Critical care time (minutes):  30   Critical care was necessary to treat or prevent imminent or life-threatening deterioration of the following  conditions:  Circulatory failure   Critical care was time spent personally by me on the following activities:  Development of treatment plan with patient or surrogate, discussions with consultants, evaluation of patient's response to treatment, examination of patient, ordering and review of laboratory studies, ordering and review of radiographic studies, ordering and performing treatments and interventions, pulse oximetry, re-evaluation of patient's condition and review of old charts   (including critical care time)  Medical Decision Making / ED Course   This patient presents to the ED for concern of dizziness, hypotension, this involves an extensive number of treatment options, and is a complaint that carries with it a high risk of complications and morbidity.  The differential diagnosis includes orthostatic hypotension, dehydration, electrolyte abnormality, ACS, bacteremia  MDM: Patient seen in the emergency room for evaluation of orthostatic dizziness and hypotension.  Physical exam largely unremarkable.  Laboratory evaluation with a mildly elevated creatinine of 1.62 which is slightly uptrending from previous.  However lactic acid is normal.  Patient given 1 L lactated Ringer's run slowly over an hour given her decreased EF.  After completion of this liter of fluids, her blood pressure significant improved.  Repeat orthostatic vital signs were obtained and lowest blood pressure with standing is 110/76.  Patient states she is feeling symptomatically improved.  Suspect decreased p.o. intake in the setting of her chronic nausea with concomitant diuretic use as source of her hypotension today.  With hypotension resolved and symptoms resolved, she currently does not meet inpatient criteria for admission and is safe for discharge with outpatient follow-up.  Patient given return precautions of which she voiced understanding she was discharged.   Additional history obtained:  -External records from  outside source obtained and reviewed including: Chart review including previous notes, labs, imaging, consultation notes   Lab Tests: -I ordered, reviewed, and interpreted labs.   The pertinent results include:   Labs Reviewed  CBC WITH DIFFERENTIAL/PLATELET - Abnormal; Notable for the following components:      Result Value   RDW 17.2 (*)    Platelets 402 (*)    All other components within normal limits  COMPREHENSIVE METABOLIC PANEL  LACTIC ACID, PLASMA  LACTIC ACID, PLASMA  TSH      EKG   EKG Interpretation Date/Time:  Tuesday January 24 2023 15:59:58 EDT Ventricular Rate:  73 PR Interval:  124 QRS Duration:  78 QT Interval:  366 QTC Calculation: 403 R Axis:   29  Text Interpretation: Normal sinus rhythm Right atrial enlargement NO SIGNIFICANT CHANGE SINCE LAST TRACING YESTERDAY When compared with ECG of 12-Jan-2023 11:39, PREVIOUS ECG IS PRESENT Confirmed by Zafir Schauer ([id number]) on 01/24/2023 4:23:35 PM          Medicines ordered and prescription drug management: Meds ordered this encounter  Medications   lactated ringers bolus 1,000 mL    -I have reviewed the patients home medicines and have made adjustments as needed  Critical interventions Fluid resuscitation   Cardiac Monitoring: The patient was maintained on a cardiac monitor.  I personally viewed and interpreted the cardiac monitored which showed an underlying rhythm of: NSR  Social Determinants of Health:  Factors impacting patients care include: none   Reevaluation: After the interventions noted  above, I reevaluated the patient and found that they have :improved  Co morbidities that complicate the patient evaluation  Past Medical History:  Diagnosis Date   AICD (automatic cardioverter/defibrillator) present    Allergic reaction to alpha-gal    Allergy 03/05/2022   Anemia    Anxiety state 03/25/2016   Arthritis    Basal cell carcinoma of forehead    Brain aneurysm    CHF (congestive  heart failure) (HCC)    Coronary artery disease    a. 03/11/16 PCI with DES-->Prox/Mid Cx;  b. 03/14/16 PCI with DES x2-->RCA, EF 30-35%.   Depression 06/09/2010   Encounter for general adult medical examination with abnormal findings 05/04/2022   Essential hypertension    GERD (gastroesophageal reflux disease)    HFrEF (heart failure with reduced ejection fraction) (HCC)    a. 10/2016 Echo: EF 35-40%, Gr1 DD, mild focal basal septal hypertrophy, basal inflat, mid inflat, basal antlat AK. Mid infept/inf/antlat, apical lateral sev HK. Mod MR. mildly reduced RV fxn. Mild TR.   History of pneumonia    Hyperlipidemia    IBS (irritable bowel syndrome)    Ischemic cardiomyopathy    a. 10/2016 Echo: EF 35-40%, Gr1 DD.   Mitral regurgitation    Neuromuscular disorder (HCC)    NSTEMI (non-ST elevated myocardial infarction) (HCC) 03/10/2016   Pneumonia 03/2016   Shingles    Squamous cell cancer of skin of nose    Thrombocytosis 03/26/2016   Tobacco abuse    Trichimoniasis    Wears dentures    Wears glasses       Dispostion: I considered admission for this patient, but with symptoms improved and hypotension resolved after fluid resuscitation patient does not meet inpatient criteria for admission and safe for discharge with outpatient follow-up      Final Clinical Impression(s) / ED Diagnoses Final diagnoses:  None     @PCDICTATION @    Glendora Score, MD 01/25/23 0000

## 2023-01-24 NOTE — ED Triage Notes (Signed)
Pt sent from Rourk's office for hypotension. 63/44 with standing. 81/50's sitting per pt. +lightheadedness , chronic nausea- reason for seeing Rourk with GI  100/49 with sitting during triage 82/59 with staning in triage.

## 2023-01-24 NOTE — Patient Instructions (Signed)
It was good seeing you again today!  Your blood pressure is too low today.  Your systolic blood pressure is in the low 50s when you stand up.  This explains your dizziness.  May also explain some of your nausea.  Recently mildly elevated LFTs; they need to be repeated.  You should go to the emergency room for further evaluation.  I do recommend complete abstinence from marijuana  Stop the Lasix.  Fasting serum cortisol to screen for Addison's disease.  Further recommendations to follow.

## 2023-01-24 NOTE — Progress Notes (Signed)
Primary Care Physician:  Billie Lade, MD Primary Gastroenterologist:  Dr. Jena Gauss  Pre-Procedure History & Physical: HPI:  Erin [name] is a 62 y.o. female here for follow-up of chronic nausea.  She is lost 7 more pounds since she was last seen here.  She is dizzy when she stands up and gets out of bed.  Has had no abdominal pain whatsoever.  She does not throw up just nausea.  She does smoke marijuana regularly to combat the nausea.  Was not take showers but says a hot cooking in the tub does relieve her nausea albeit transiently.  Denies reflux on twice daily PPI therapy.  No melena or rectal bleeding.   Systolic BP here in the office 81 sitting.;  Standing drops to 53 Patient has significant cardiomyopathy-AICD in place.  Chronic congestive heart failure followed by cardiology.  Abs done 12 days ago demonstrated steadily relative increase in BUN from the 7 range to 20 also creatinine has steadily crept up; 1.46.  AST 62/ALT 45/total bilirubin 1.0  Recent EGD negative.  GES last year normal  Past Medical History:  Diagnosis Date   AICD (automatic cardioverter/defibrillator) present    Allergic reaction to alpha-gal    Allergy 03/05/2022   Anemia    Anxiety state 03/25/2016   Arthritis    Basal cell carcinoma of forehead    Brain aneurysm    CHF (congestive heart failure) (HCC)    Coronary artery disease    a. 03/11/16 PCI with DES-->Prox/Mid Cx;  b. 03/14/16 PCI with DES x2-->RCA, EF 30-35%.   Depression 06/09/2010   Encounter for general adult medical examination with abnormal findings 05/04/2022   Essential hypertension    GERD (gastroesophageal reflux disease)    HFrEF (heart failure with reduced ejection fraction) (HCC)    a. 10/2016 Echo: EF 35-40%, Gr1 DD, mild focal basal septal hypertrophy, basal inflat, mid inflat, basal antlat AK. Mid infept/inf/antlat, apical lateral sev HK. Mod MR. mildly reduced RV fxn. Mild TR.   History of pneumonia    Hyperlipidemia    IBS  (irritable bowel syndrome)    Ischemic cardiomyopathy    a. 10/2016 Echo: EF 35-40%, Gr1 DD.   Mitral regurgitation    Neuromuscular disorder (HCC)    NSTEMI (non-ST elevated myocardial infarction) (HCC) 03/10/2016   Pneumonia 03/2016   Shingles    Squamous cell cancer of skin of nose    Thrombocytosis 03/26/2016   Tobacco abuse    Trichimoniasis    Wears dentures    Wears glasses     Past Surgical History:  Procedure Laterality Date   APPENDECTOMY     BIOPSY  09/20/2018   Procedure: BIOPSY;  Surgeon: Corbin Ade, MD;  [facility];  Service: Endoscopy;;  colon   BIOPSY  01/05/2021   Procedure: BIOPSY;  Surgeon: Rachael Fee, MD;  Location: [facility] ENDOSCOPY;  Service: Endoscopy;;   BIOPSY  04/27/2022   Procedure: BIOPSY;  Surgeon: Lanelle Bal, DO;  Location: [facility];  Service: Endoscopy;;   CARDIAC CATHETERIZATION N/A 03/11/2016   Procedure: Left Heart Cath and Coronary Angiography;  Surgeon: Marykay Lex, MD;  Location: [facility] INVASIVE CV LAB;  Service: Cardiovascular;  Laterality: N/A;   CARDIAC CATHETERIZATION N/A 03/11/2016   Procedure: Coronary Stent Intervention;  Surgeon: Marykay Lex, MD;  Location: [facility] INVASIVE CV LAB;  Service: Cardiovascular;  Laterality: N/A;   CARDIAC CATHETERIZATION N/A 03/14/2016   Procedure: Coronary Stent Intervention;  Surgeon:  Peter M Swaziland, MD;  Location: [facility] INVASIVE CV LAB;  Service: Cardiovascular;  Laterality: N/A;   CEREBRAL ANEURYSM REPAIR  04/2019   stent placed   CHOLECYSTECTOMY OPEN  1984   COLONOSCOPY WITH PROPOFOL N/A 09/20/2018   Procedure: COLONOSCOPY WITH PROPOFOL;  Surgeon: Corbin Ade, MD;  [facility];  Service: Endoscopy;  Laterality: N/A;  10:30am   CORONARY ANGIOPLASTY WITH STENT PLACEMENT  03/14/2016   ESOPHAGOGASTRODUODENOSCOPY (EGD) WITH PROPOFOL N/A 09/20/2018   Procedure: ESOPHAGOGASTRODUODENOSCOPY (EGD) WITH PROPOFOL;  Surgeon: Corbin Ade, MD;  [facility];   Service: Endoscopy;  Laterality: N/A;   ESOPHAGOGASTRODUODENOSCOPY (EGD) WITH PROPOFOL N/A 01/05/2021   Procedure: ESOPHAGOGASTRODUODENOSCOPY (EGD) WITH PROPOFOL;  Surgeon: Rachael Fee, MD;  Location: [facility] ENDOSCOPY;  Service: Endoscopy;  Laterality: N/A;   ESOPHAGOGASTRODUODENOSCOPY (EGD) WITH PROPOFOL N/A 04/27/2022   Procedure: ESOPHAGOGASTRODUODENOSCOPY (EGD) WITH PROPOFOL;  Surgeon: Lanelle Bal, DO;  [facility];  Service: Endoscopy;  Laterality: N/A;  9:15am, asa 3/4, ASAP   ESOPHAGOGASTRODUODENOSCOPY (EGD) WITH PROPOFOL N/A 01/04/2023   Procedure: ESOPHAGOGASTRODUODENOSCOPY (EGD) WITH PROPOFOL;  Surgeon: Corbin Ade, MD;  [facility];  Service: Endoscopy;  Laterality: N/A;  130pm, asa 3   FINGER ARTHROPLASTY Left 05/14/2013   Procedure: LEFT THUMB CARPAL METACARPAL ARTHROPLASTY;  Surgeon: Tami Ribas, MD;  Location: [facility];  Service: Orthopedics;  Laterality: Left;   ICD IMPLANT N/A 04/03/2020   Procedure: ICD IMPLANT;  Surgeon: Regan Lemming, MD;  Location: [facility] INVASIVE CV LAB;  Service: Cardiovascular;  Laterality: N/A;   IR ANGIO INTRA EXTRACRAN SEL COM CAROTID INNOMINATE BILAT MOD SED  01/05/2017   IR ANGIO INTRA EXTRACRAN SEL COM CAROTID INNOMINATE BILAT MOD SED  03/19/2019   IR ANGIO INTRA EXTRACRAN SEL COM CAROTID INNOMINATE BILAT MOD SED  06/04/2020   IR ANGIO INTRA EXTRACRAN SEL INTERNAL CAROTID UNI L MOD SED  04/15/2019   IR ANGIO VERTEBRAL SEL VERTEBRAL BILAT MOD SED  01/05/2017   IR ANGIO VERTEBRAL SEL VERTEBRAL BILAT MOD SED  03/19/2019   IR ANGIO VERTEBRAL SEL VERTEBRAL UNI L MOD SED  06/04/2020   IR ANGIOGRAM FOLLOW UP STUDY  04/15/2019   IR RADIOLOGIST EVAL & MGMT  12/30/2016   IR TRANSCATH/EMBOLIZ  04/15/2019   IR US GUIDE VASC ACCESS RIGHT  03/19/2019   IR US GUIDE VASC ACCESS RIGHT  06/04/2020   JOINT REPLACEMENT  05-15-2013   MALONEY DILATION N/A 09/20/2018   Procedure: Elease Hashimoto DILATION;  Surgeon: Corbin Ade, MD;  [facility];  Service: Endoscopy;  Laterality: N/A;   MALONEY DILATION N/A 01/04/2023   Procedure: Elease Hashimoto DILATION;  Surgeon: Corbin Ade, MD;  [facility];  Service: Endoscopy;  Laterality: N/A;   RADIOLOGY WITH ANESTHESIA N/A 04/15/2019   Procedure: Sharman Crate;  Surgeon: Julieanne Cotton, MD;  Location: [facility] OR;  Service: Radiology;  Laterality: N/A;   RIGHT/LEFT HEART CATH AND CORONARY ANGIOGRAPHY N/A 08/19/2019   Procedure: RIGHT/LEFT HEART CATH AND CORONARY ANGIOGRAPHY;  Surgeon: Laurey Morale, MD;  Location: [facility] INVASIVE CV LAB;  Service: Cardiovascular;  Laterality: N/A;   TUBAL LIGATION  1987   VAGINAL HYSTERECTOMY  2009    Prior to Admission medications   Medication Sig Start Date End Date Taking? Authorizing Provider  aspirin EC 81 MG tablet Take 81 mg by mouth every evening.    Yes [provider]  atorvastatin (LIPITOR) 80 MG tablet Take 1 tablet (80 mg  total) by mouth daily. 09/05/22  Yes Laurey Morale, MD  clopidogrel (PLAVIX) 75 MG tablet Take 1 tablet by mouth once daily 07/12/22  Yes Laurey Morale, MD  dapagliflozin propanediol (FARXIGA) 10 MG TABS tablet Take 1 tablet (10 mg total) by mouth daily before breakfast. 09/12/22  Yes Milford, Anderson Malta, FNP  EPINEPHrine (EPIPEN 2-PAK) 0.3 mg/0.3 mL IJ SOAJ injection Inject 0.3 mg into the muscle as needed for anaphylaxis. 04/23/21  Yes Alfonse Spruce, MD  Evolocumab (REPATHA SURECLICK) 140 MG/ML SOAJ Inject 140 mg into the skin every 14 (fourteen) days. 09/07/22  Yes Laurey Morale, MD  fenofibrate (TRICOR) 145 MG tablet Take 1 tablet (145 mg total) by mouth daily. 07/27/22  Yes Laurey Morale, MD  furosemide (LASIX) 20 MG tablet Take 1 tablet (20 mg total) by mouth daily. 06/13/22  Yes Laurey Morale, MD  gabapentin (NEURONTIN) 300 MG capsule Take 1 capsule by mouth at bedtime 01/16/23  Yes Billie Lade, MD  linaclotide [facility]) 145 MCG CAPS capsule Take 1 capsule  (145 mcg total) by mouth daily before breakfast. 11/22/22  Yes Tammara Massing, Gerrit Friends, MD  losartan (COZAAR) 25 MG tablet Take 1 tablet (25 mg total) by mouth at bedtime. 09/12/22  Yes Milford, Anderson Malta, FNP  metoprolol succinate (TOPROL XL) 25 MG 24 hr tablet Take 1 tablet (25 mg total) by mouth daily. 12/19/22  Yes Laurey Morale, MD  Multiple Vitamins-Minerals (MULTIVITAMIN WITH MINERALS) tablet Take 1 tablet by mouth daily.   Yes [provider]  nitroGLYCERIN (NITROSTAT) 0.4 MG SL tablet Place 1 tablet (0.4 mg total) under the tongue every 5 (five) minutes x 3 doses as needed for chest pain (if no relief after 2nd dose, proceed to the ED for an evaluation or call 911). 09/19/22  Yes Laurey Morale, MD  ondansetron (ZOFRAN) 4 MG tablet Take 1 tablet (4 mg total) by mouth every 6 (six) hours as needed for nausea or vomiting. 11/23/22  Yes Peng Thorstenson, Gerrit Friends, MD  pantoprazole (PROTONIX) 40 MG tablet TAKE 1 TABLET BY MOUTH TWICE DAILY BEFORE A MEAL 10/17/22  Yes Gelene Mink, NP  potassium chloride SA (KLOR-CON M) 20 MEQ tablet Take 1 tablet (20 mEq total) by mouth 2 (two) times daily. 07/11/22  Yes Laurey Morale, MD  rOPINIRole (REQUIP) 0.5 MG tablet Take 0.5 mg by mouth at bedtime.  11/13/17  Yes [provider]  scopolamine (TRANSDERM-SCOP) 1 MG/3DAYS Place 1 patch (1.5 mg total) onto the skin every 3 (three) days. 01/12/23  Yes Judithann Sheen, PA  spironolactone (ALDACTONE) 25 MG tablet Take 1 tablet (25 mg total) by mouth at bedtime. 03/07/22  Yes Laurey Morale, MD  sucralfate (CARAFATE) 1 GM/10ML suspension Take 10 mLs (1 g total) by mouth 4 (four) times daily. 04/27/22 04/27/23 Yes Carver, Hennie Duos, DO  traMADol (ULTRAM) 50 MG tablet Take 1 tablet (50 mg total) by mouth 3 (three) times daily as needed. 07/27/22  Yes Cristie Hem, PA-C  varenicline (CHANTIX) 1 MG tablet Take 1 tablet (1 mg total) by mouth 2 (two) times daily. 09/12/22  Yes Milford, Anderson Malta, FNP  [name] 1 g capsule  Take 2 capsules (2 g total) by mouth 2 (two) times daily. 08/18/22  Yes Laurey Morale, MD    Allergies as of 01/24/2023 - Review Complete 01/24/2023  Allergen Reaction Noted   Alpha-gal Shortness Of Breath, Nausea And Vomiting, and Dermatitis 02/28/2022   Other Shortness  Of Breath, Diarrhea, Nausea And Vomiting, and Nausea Only 03/02/2021   Tape Other (See Comments) 01/03/2017    Family History  Problem Relation Age of Onset   Stroke Mother    Hypertension Mother    Diabetes Mother    Heart attack Mother    Heart attack Father    Diabetes Father    Hypertension Father    CAD Father    Colon polyps Father 7       pre-cancerous    Stroke Father    Dementia Father    Hyperlipidemia Father    Arthritis Father    COPD Father    Heart disease Father    Breast cancer Maternal Grandmother    Diabetes Maternal Grandmother    Cancer Maternal Grandfather        Tongue and esophageal   Anxiety disorder Daughter    Depression Daughter    Anxiety disorder Daughter    Heart failure Other    Cancer Paternal Grandmother    Colon cancer Neg Hx     Social History   Socioeconomic History   Marital status: Married    Spouse name: Not on file   Number of children: Not on file   Years of education: Not on file   Highest education level: Not on file  Occupational History   Occupation: CNA  Tobacco Use   Smoking status: Every Day    Current packs/day: 0.25    Average packs/day: 0.3 packs/day for 15.0 years (3.8 ttl pk-yrs)    Types: Cigarettes   Smokeless tobacco: Never   Tobacco comments:    smokes a cigarette occasionally  Vaping Use   Vaping status: Never Used  Substance and Sexual Activity   Alcohol use: Yes    Comment: occasionally   Drug use: Not Currently    Types: Marijuana    Comment: former- 2017 last time   Sexual activity: Not Currently    Birth control/protection: Surgical    Comment: hyst  Other Topics Concern   Not on file  Social History Narrative    Lives with husband in [place] in a one story home with a basement.  Has 4 children.  Works as a Lawyer.  Education: CNA school.    Social Determinants of Health   Financial Resource Strain: Low Risk  (06/11/2021)   Overall Financial Resource Strain (CARDIA)    Difficulty of Paying Living Expenses: Not hard at all  Food Insecurity: No Food Insecurity (06/11/2021)   Hunger Vital Sign    Worried About Running Out of Food in the Last Year: Never true    Ran Out of Food in the Last Year: Never true  Transportation Needs: No Transportation Needs (06/11/2021)   PRAPARE - Administrator, Civil Service (Medical): No    Lack of Transportation (Non-Medical): No  Physical Activity: Insufficiently Active (06/11/2021)   Exercise Vital Sign    Days of Exercise per Week: 4 days    Minutes of Exercise per Session: 30 min  Stress: Stress Concern Present (06/11/2021)   [employer] of Occupational Health - Occupational Stress Questionnaire    Feeling of Stress : Very much  Social Connections: Socially Isolated (06/11/2021)   Social Connection and Isolation Panel [NHANES]    Frequency of Communication with Friends and Family: Once a week    Frequency of Social Gatherings with Friends and Family: Never    Attends Religious Services: Never    Database administrator or Organizations: No  Attends Banker Meetings: Never    Marital Status: Married  Catering manager Violence: Not At Risk (06/11/2021)   Humiliation, Afraid, Rape, and Kick questionnaire    Fear of Current or Ex-Partner: No    Emotionally Abused: No    Physically Abused: No    Sexually Abused: No    Review of Systems: See HPI, otherwise negative ROS  Physical Exam: BP (!) 81/54 (BP Location: Right Arm, Patient Position: Sitting, Cuff Size: Normal)   Pulse 92   Temp 98.7 F (37.1 C) (Oral)   Ht 5' (1.524 m)   Wt 113 lb 12.8 oz (51.6 kg)   SpO2 95%   BMI 22.23 kg/m  General:   Alert, thin chronically ill-appearing  lady pleasant in no acute distress.   Lungs:  Clear throughout to auscultation.   No wheezes, crackles, or rhonchi. No acute distress. Heart:  Regular rate and rhythm; no murmurs, clicks, rubs,  or gallops. Abdomen: Non-distended, normal bowel sounds.  Soft and nontender without appreciable mass or hepatosplenomegaly.  Pulses:  Normal pulses noted. Extremities:  Without clubbing or edema.  Impression/Plan: 62 year old lady with chronic nausea and weight loss.  Rarely vomits.  Regular cannabis use. More recently, dizziness upon standing.  Increase creatinine and relative increase in BUN.  She is quite orthostatic today.  Cannot rule out an element of cannabinoid hyperemesis syndrome.  Is notable she gets some relief from nausea with hot water exposure.  She absolutely denies any form of abdominal pain.  Bump in LFTs-nonspecific.  Clinically, appears volume contracted although an element of heart failure cannot be excluded.  Diuretics likely related to intra intravascular volume depletion.  The possibility of adrenal insufficiency was entertained previously; fasting cortisol ordered but never completed.   Recommendations:  Needs urgent ED evaluation.  Recommend holding diuretics for the time.  Stop using cannabis completely.  Recently mildly elevated LFTs; they need to be repeated.  Stop diuretics for now.  Fasting serum cortisol to screen for adrenal insufficiency   further recommendations to follow.         Notice: This dictation was prepared with Dragon dictation along with smaller phrase technology. Any transcriptional errors that result from this process are unintentional and may not be corrected upon review.

## 2023-01-24 NOTE — ED Notes (Signed)
Pt reports that she is restricted to 1.5L of fluid per day.

## 2023-01-26 ENCOUNTER — Encounter: Payer: Self-pay | Admitting: Orthopedic Surgery

## 2023-01-27 ENCOUNTER — Encounter (HOSPITAL_COMMUNITY): Payer: Self-pay | Admitting: Cardiology

## 2023-01-31 ENCOUNTER — Telehealth: Payer: Self-pay | Admitting: *Deleted

## 2023-01-31 ENCOUNTER — Other Ambulatory Visit: Payer: Medicare PPO

## 2023-01-31 NOTE — Telephone Encounter (Signed)
She was probably not in the ER for cardiac issues.  She can hold Plavix for surgery, on it for cerebral circulation issues but no interventions for a long time.  Ok for surgery if no new symptoms.

## 2023-01-31 NOTE — Telephone Encounter (Signed)
Name: Erin [name]  DOB: Apr 22, 1961  MRN: [id number]  Primary Cardiologist: Marca Ancona, MD   Preoperative team, please contact this patient and set up a phone call appointment for further preoperative risk assessment. Please obtain consent and complete medication review. Thank you for your help.  I confirm that guidance regarding antiplatelet and oral anticoagulation therapy has been completed and, if necessary, noted below.   Marcelino Duster, PA 01/31/2023, 2:29 PM [facility]

## 2023-01-31 NOTE — Telephone Encounter (Signed)
I left a message for the patient to call our office to schedule a tele visit for pre-op clearance.  

## 2023-01-31 NOTE — Telephone Encounter (Signed)
We have been contacted regarding upcoming shoulder surgery.   Although medical clearance is not specifically asked, she has had two ER visits since last office visit for hypotension and chest pain.   In addition, Dr. Shirlee Latch mentions in his last note that plavix is managed by [facility], but was last prescribed by [facility].   Dr. Shirlee Latch, do you recommend an in office visit prior to shoulder surgery or any further workup? Can you please clarify plavix hold if we are managing?

## 2023-01-31 NOTE — Telephone Encounter (Signed)
Pre-operative Risk Assessment    Patient Name: Erin [name]  DOB: 11-Jan-1961 MRN: [id number]    LAST OFFICE VISIT 01/04/23 NEXT OFFICE VISIT 03/21/23  Request for Surgical Clearance    Procedure:   LEFT RERSE SHOULDER ARTHROPLASTY  Date of Surgery:  Clearance TBD                                 Surgeon:  G. Janice Norrie, MD Surgeon's Group or Practice Name:  [facility] Phone number:  [phone] Fax number:  [phone]   Type of Clearance Requested:   - Pharmacy:  Hold Clopidogrel (Plavix) NOT INDICATED HOW LONG   Type of Anesthesia:  General    Additional requests/questions:    Wilhemina Cash   01/31/2023, 8:09 AM

## 2023-02-02 ENCOUNTER — Telehealth: Payer: Self-pay | Admitting: *Deleted

## 2023-02-02 NOTE — Telephone Encounter (Signed)
Pt has been scheduled for tele pre op appt 02/24/23. Med rec and consent are done. Pt tells me that surgery is planned for 03/21/23. I will update all parties involved.

## 2023-02-02 NOTE — Telephone Encounter (Signed)
Pt has been scheduled for tele pre op appt 02/24/23. Med rec and consent are done. Pt tells me that surgery is planned for 03/21/23. I will update all parties involved.     Patient Consent for Virtual Visit        KANDUS POGANY has provided verbal consent on 02/02/2023 for a virtual visit (video or telephone).   CONSENT FOR VIRTUAL VISIT FOR:  [name]  By participating in this virtual visit I agree to the following:  I hereby voluntarily request, consent and authorize [facility] and its employed or contracted physicians, physician assistants, nurse practitioners or other licensed health care professionals (the Practitioner), to provide me with telemedicine health care services (the "Services") as deemed necessary by the treating Practitioner. I acknowledge and consent to receive the Services by the Practitioner via telemedicine. I understand that the telemedicine visit will involve communicating with the Practitioner through live audiovisual communication technology and the disclosure of certain medical information by electronic transmission. I acknowledge that I have been given the opportunity to request an in-person assessment or other available alternative prior to the telemedicine visit and am voluntarily participating in the telemedicine visit.  I understand that I have the right to withhold or withdraw my consent to the use of telemedicine in the course of my care at any time, without affecting my right to future care or treatment, and that the Practitioner or I may terminate the telemedicine visit at any time. I understand that I have the right to inspect all information obtained and/or recorded in the course of the telemedicine visit and may receive copies of available information for a reasonable fee.  I understand that some of the potential risks of receiving the Services via telemedicine include:  Delay or interruption in medical evaluation due to technological  equipment failure or disruption; Information transmitted may not be sufficient (e.g. poor resolution of images) to allow for appropriate medical decision making by the Practitioner; and/or  In rare instances, security protocols could fail, causing a breach of personal health information.  Furthermore, I acknowledge that it is my responsibility to provide information about my medical history, conditions and care that is complete and accurate to the best of my ability. I acknowledge that Practitioner's advice, recommendations, and/or decision may be based on factors not within their control, such as incomplete or inaccurate data provided by me or distortions of diagnostic images or specimens that may result from electronic transmissions. I understand that the practice of medicine is not an exact science and that Practitioner makes no warranties or guarantees regarding treatment outcomes. I acknowledge that a copy of this consent can be made available to me via my patient portal [facility] MyChart), or I can request a printed copy by calling the office of [facility].    I understand that my insurance will be billed for this visit.   I have read or had this consent read to me. I understand the contents of this consent, which adequately explains the benefits and risks of the Services being provided via telemedicine.  I have been provided ample opportunity to ask questions regarding this consent and the Services and have had my questions answered to my satisfaction. I give my informed consent for the services to be provided through the use of telemedicine in my medical care

## 2023-02-15 ENCOUNTER — Ambulatory Visit: Payer: Medicare PPO | Admitting: Internal Medicine

## 2023-02-15 ENCOUNTER — Ambulatory Visit (INDEPENDENT_AMBULATORY_CARE_PROVIDER_SITE_OTHER): Payer: Medicare PPO

## 2023-02-15 DIAGNOSIS — I255 Ischemic cardiomyopathy: Secondary | ICD-10-CM | POA: Diagnosis not present

## 2023-02-15 LAB — CUP PACEART REMOTE DEVICE CHECK
Battery Remaining Longevity: 118 mo
Battery Voltage: 3.02 V
Brady Statistic RV Percent Paced: 0.01 %
Date Time Interrogation Session: 20241009123525
HighPow Impedance: 91 Ohm
Implantable Lead Connection Status: 753985
Implantable Lead Implant Date: 20211126
Implantable Lead Location: 753860
Implantable Pulse Generator Implant Date: 20211126
Lead Channel Impedance Value: 589 Ohm
Lead Channel Impedance Value: 703 Ohm
Lead Channel Pacing Threshold Amplitude: 0.75 V
Lead Channel Pacing Threshold Pulse Width: 0.4 ms
Lead Channel Sensing Intrinsic Amplitude: 4.5 mV
Lead Channel Sensing Intrinsic Amplitude: 4.5 mV
Lead Channel Setting Pacing Amplitude: 2 V
Lead Channel Setting Pacing Pulse Width: 0.4 ms
Lead Channel Setting Sensing Sensitivity: 0.3 mV
Zone Setting Status: 755011
Zone Setting Status: 755011

## 2023-02-18 NOTE — Progress Notes (Signed)
Hi Erin [name] can you order CT scan chest with IV contrast to evaluate benign-appearing nodule in the lung tissue.  Thanks

## 2023-02-20 ENCOUNTER — Other Ambulatory Visit: Payer: Self-pay

## 2023-02-20 DIAGNOSIS — Z09 Encounter for follow-up examination after completed treatment for conditions other than malignant neoplasm: Secondary | ICD-10-CM

## 2023-02-21 ENCOUNTER — Encounter: Payer: Self-pay | Admitting: Orthopedic Surgery

## 2023-02-21 ENCOUNTER — Encounter: Payer: Self-pay | Admitting: Internal Medicine

## 2023-02-24 ENCOUNTER — Ambulatory Visit: Payer: Medicare PPO | Attending: Physician Assistant

## 2023-02-24 DIAGNOSIS — Z01810 Encounter for preprocedural cardiovascular examination: Secondary | ICD-10-CM

## 2023-02-24 NOTE — Progress Notes (Signed)
Virtual Visit via Telephone Note   Because of [name] Traynham's co-morbid illnesses, she is at least at moderate risk for complications without adequate follow up.  This format is felt to be most appropriate for this patient at this time.  The patient did not have access to video technology/had technical difficulties with video requiring transitioning to audio format only (telephone).  All issues noted in this document were discussed and addressed.  No physical exam could be performed with this format.  Please refer to the patient's chart for her consent to telehealth for [facility]  Evaluation Performed:  Preoperative cardiovascular risk assessment _____________   Date:  02/24/2023   Patient ID:  Veleda, Harnois 1960/07/14, MRN [id number] Patient Location:  Home Provider location:   Office  Primary Care Provider:  Billie Lade, MD Primary Cardiologist:  Marca Ancona, MD  Chief Complaint / Patient Profile   62 y.o. y/o female with a h/o CAD, ischemic cardiomyopathy, and PAD who is pending left reverse shoulder arthroplasty and presents today for telephonic preoperative cardiovascular risk assessment.  History of Present Illness    [name] is a 62 y.o. female who presents via Web designer for a telehealth visit today.  Pt was last seen in cardiology clinic on 12/19/2022 by Dr. Shirlee Latch.  At that time [name] was doing well .  The patient is now pending procedure as outlined above. Since her last visit, she   Did not answer x 2, VM left. She will need to reschedule.    She can hold Plavix for surgery, she is on it for cerebral circulation issues but no interventions for a long time. Ok for surgery if no new symptoms.   Past Medical History    Past Medical History:  Diagnosis Date   AICD (automatic cardioverter/defibrillator) present    Allergic reaction to alpha-gal    Allergy 03/05/2022   Anemia    Anxiety state 03/25/2016    Arthritis    Basal cell carcinoma of forehead    Brain aneurysm    CHF (congestive heart failure) (HCC)    Coronary artery disease    a. 03/11/16 PCI with DES-->Prox/Mid Cx;  b. 03/14/16 PCI with DES x2-->RCA, EF 30-35%.   Depression 06/09/2010   Encounter for general adult medical examination with abnormal findings 05/04/2022   Essential hypertension    GERD (gastroesophageal reflux disease)    HFrEF (heart failure with reduced ejection fraction) (HCC)    a. 10/2016 Echo: EF 35-40%, Gr1 DD, mild focal basal septal hypertrophy, basal inflat, mid inflat, basal antlat AK. Mid infept/inf/antlat, apical lateral sev HK. Mod MR. mildly reduced RV fxn. Mild TR.   History of pneumonia    Hyperlipidemia    IBS (irritable bowel syndrome)    Ischemic cardiomyopathy    a. 10/2016 Echo: EF 35-40%, Gr1 DD.   Mitral regurgitation    Neuromuscular disorder (HCC)    NSTEMI (non-ST elevated myocardial infarction) (HCC) 03/10/2016   Pneumonia 03/2016   Shingles    Squamous cell cancer of skin of nose    Thrombocytosis 03/26/2016   Tobacco abuse    Trichimoniasis    Wears dentures    Wears glasses    Past Surgical History:  Procedure Laterality Date   APPENDECTOMY     BIOPSY  09/20/2018   Procedure: BIOPSY;  Surgeon: Corbin Ade, MD;  [facility];  Service: Endoscopy;;  colon   BIOPSY  01/05/2021   Procedure:  BIOPSY;  Surgeon: Rachael Fee, MD;  Location: [facility] ENDOSCOPY;  Service: Endoscopy;;   BIOPSY  04/27/2022   Procedure: BIOPSY;  Surgeon: Lanelle Bal, DO;  [facility];  Service: Endoscopy;;   CARDIAC CATHETERIZATION N/A 03/11/2016   Procedure: Left Heart Cath and Coronary Angiography;  Surgeon: Marykay Lex, MD;  Location: [facility] INVASIVE CV LAB;  Service: Cardiovascular;  Laterality: N/A;   CARDIAC CATHETERIZATION N/A 03/11/2016   Procedure: Coronary Stent Intervention;  Surgeon: Marykay Lex, MD;  Location: [facility] INVASIVE CV LAB;  Service: Cardiovascular;   Laterality: N/A;   CARDIAC CATHETERIZATION N/A 03/14/2016   Procedure: Coronary Stent Intervention;  Surgeon: Peter M Swaziland, MD;  Location: [facility] INVASIVE CV LAB;  Service: Cardiovascular;  Laterality: N/A;   CEREBRAL ANEURYSM REPAIR  04/2019   stent placed   CHOLECYSTECTOMY OPEN  1984   COLONOSCOPY WITH PROPOFOL N/A 09/20/2018   Procedure: COLONOSCOPY WITH PROPOFOL;  Surgeon: Corbin Ade, MD;  [facility];  Service: Endoscopy;  Laterality: N/A;  10:30am   CORONARY ANGIOPLASTY WITH STENT PLACEMENT  03/14/2016   ESOPHAGOGASTRODUODENOSCOPY (EGD) WITH PROPOFOL N/A 09/20/2018   Procedure: ESOPHAGOGASTRODUODENOSCOPY (EGD) WITH PROPOFOL;  Surgeon: Corbin Ade, MD;  [facility];  Service: Endoscopy;  Laterality: N/A;   ESOPHAGOGASTRODUODENOSCOPY (EGD) WITH PROPOFOL N/A 01/05/2021   Procedure: ESOPHAGOGASTRODUODENOSCOPY (EGD) WITH PROPOFOL;  Surgeon: Rachael Fee, MD;  Location: [facility] ENDOSCOPY;  Service: Endoscopy;  Laterality: N/A;   ESOPHAGOGASTRODUODENOSCOPY (EGD) WITH PROPOFOL N/A 04/27/2022   Procedure: ESOPHAGOGASTRODUODENOSCOPY (EGD) WITH PROPOFOL;  Surgeon: Lanelle Bal, DO;  [facility];  Service: Endoscopy;  Laterality: N/A;  9:15am, asa 3/4, ASAP   ESOPHAGOGASTRODUODENOSCOPY (EGD) WITH PROPOFOL N/A 01/04/2023   Procedure: ESOPHAGOGASTRODUODENOSCOPY (EGD) WITH PROPOFOL;  Surgeon: Corbin Ade, MD;  [facility];  Service: Endoscopy;  Laterality: N/A;  130pm, asa 3   FINGER ARTHROPLASTY Left 05/14/2013   Procedure: LEFT THUMB CARPAL METACARPAL ARTHROPLASTY;  Surgeon: Tami Ribas, MD;  Location: [facility];  Service: Orthopedics;  Laterality: Left;   ICD IMPLANT N/A 04/03/2020   Procedure: ICD IMPLANT;  Surgeon: Regan Lemming, MD;  Location: [facility] INVASIVE CV LAB;  Service: Cardiovascular;  Laterality: N/A;   IR ANGIO INTRA EXTRACRAN SEL COM CAROTID INNOMINATE BILAT MOD SED  01/05/2017   IR ANGIO INTRA EXTRACRAN  SEL COM CAROTID INNOMINATE BILAT MOD SED  03/19/2019   IR ANGIO INTRA EXTRACRAN SEL COM CAROTID INNOMINATE BILAT MOD SED  06/04/2020   IR ANGIO INTRA EXTRACRAN SEL INTERNAL CAROTID UNI L MOD SED  04/15/2019   IR ANGIO VERTEBRAL SEL VERTEBRAL BILAT MOD SED  01/05/2017   IR ANGIO VERTEBRAL SEL VERTEBRAL BILAT MOD SED  03/19/2019   IR ANGIO VERTEBRAL SEL VERTEBRAL UNI L MOD SED  06/04/2020   IR ANGIOGRAM FOLLOW UP STUDY  04/15/2019   IR RADIOLOGIST EVAL & MGMT  12/30/2016   IR TRANSCATH/EMBOLIZ  04/15/2019   IR US GUIDE VASC ACCESS RIGHT  03/19/2019   IR US GUIDE VASC ACCESS RIGHT  06/04/2020   JOINT REPLACEMENT  05-15-2013   MALONEY DILATION N/A 09/20/2018   Procedure: Elease Hashimoto DILATION;  Surgeon: Corbin Ade, MD;  [facility];  Service: Endoscopy;  Laterality: N/A;   MALONEY DILATION N/A 01/04/2023   Procedure: Elease Hashimoto DILATION;  Surgeon: Corbin Ade, MD;  [facility];  Service: Endoscopy;  Laterality: N/A;   RADIOLOGY WITH ANESTHESIA N/A 04/15/2019   Procedure:  EMBLOZATION;  Surgeon: Julieanne Cotton, MD;  Location: [facility] OR;  Service: Radiology;  Laterality: N/A;   RIGHT/LEFT HEART CATH AND CORONARY ANGIOGRAPHY N/A 08/19/2019   Procedure: RIGHT/LEFT HEART CATH AND CORONARY ANGIOGRAPHY;  Surgeon: Laurey Morale, MD;  Location: [facility] INVASIVE CV LAB;  Service: Cardiovascular;  Laterality: N/A;   TUBAL LIGATION  1987   VAGINAL HYSTERECTOMY  2009    Allergies  Allergies  Allergen Reactions   Alpha-Gal Shortness Of Breath, Nausea And Vomiting and Dermatitis   Other Shortness Of Breath, Diarrhea, Nausea And Vomiting and Nausea Only    All- red meats  Medications in Capsule form    Tape Other (See Comments)    PEELS SKIN OFF  (PAPER TAPE IS FINE)    Home Medications    Prior to Admission medications   Medication Sig Start Date End Date Taking? Authorizing Provider  aspirin EC 81 MG tablet Take 81 mg by mouth every evening.     [provider]   atorvastatin (LIPITOR) 80 MG tablet Take 1 tablet (80 mg total) by mouth daily. 09/05/22   Laurey Morale, MD  clopidogrel (PLAVIX) 75 MG tablet Take 1 tablet by mouth once daily 07/12/22   Laurey Morale, MD  dapagliflozin propanediol (FARXIGA) 10 MG TABS tablet Take 1 tablet (10 mg total) by mouth daily before breakfast. 09/12/22   Milford, Anderson Malta, FNP  EPINEPHrine (EPIPEN 2-PAK) 0.3 mg/0.3 mL IJ SOAJ injection Inject 0.3 mg into the muscle as needed for anaphylaxis. 04/23/21   Alfonse Spruce, MD  Evolocumab (REPATHA SURECLICK) 140 MG/ML SOAJ Inject 140 mg into the skin every 14 (fourteen) days. 09/07/22   Laurey Morale, MD  fenofibrate (TRICOR) 145 MG tablet Take 1 tablet (145 mg total) by mouth daily. 07/27/22   Laurey Morale, MD  furosemide (LASIX) 20 MG tablet Take 1 tablet (20 mg total) by mouth daily. 06/13/22   Laurey Morale, MD  gabapentin (NEURONTIN) 300 MG capsule Take 1 capsule by mouth at bedtime 01/16/23   Billie Lade, MD  linaclotide [facility]) 145 MCG CAPS capsule Take 1 capsule (145 mcg total) by mouth daily before breakfast. 11/22/22   Rourk, Gerrit Friends, MD  losartan (COZAAR) 25 MG tablet Take 1 tablet (25 mg total) by mouth at bedtime. 09/12/22   Milford, Anderson Malta, FNP  metoprolol succinate (TOPROL XL) 25 MG 24 hr tablet Take 1 tablet (25 mg total) by mouth daily. 12/19/22   Laurey Morale, MD  Multiple Vitamins-Minerals (MULTIVITAMIN WITH MINERALS) tablet Take 1 tablet by mouth daily.    [provider]  nitroGLYCERIN (NITROSTAT) 0.4 MG SL tablet Place 1 tablet (0.4 mg total) under the tongue every 5 (five) minutes x 3 doses as needed for chest pain (if no relief after 2nd dose, proceed to the ED for an evaluation or call 911). 09/19/22   Laurey Morale, MD  ondansetron (ZOFRAN) 4 MG tablet Take 1 tablet (4 mg total) by mouth every 6 (six) hours as needed for nausea or vomiting. 11/23/22   Rourk, Gerrit Friends, MD  pantoprazole (PROTONIX) 40 MG tablet TAKE 1  TABLET BY MOUTH TWICE DAILY BEFORE A MEAL 10/17/22   Gelene Mink, NP  potassium chloride SA (KLOR-CON M) 20 MEQ tablet Take 1 tablet (20 mEq total) by mouth 2 (two) times daily. 07/11/22   Laurey Morale, MD  rOPINIRole (REQUIP) 0.5 MG tablet Take 0.5 mg by mouth at bedtime.  11/13/17   [provider]  scopolamine (TRANSDERM-SCOP) 1 MG/3DAYS Place 1 patch (1.5 mg total) onto the skin every 3 (three) days. 01/12/23   Judithann Sheen, PA  spironolactone (ALDACTONE) 25 MG tablet Take 1 tablet (25 mg total) by mouth at bedtime. 03/07/22   Laurey Morale, MD  sucralfate (CARAFATE) 1 GM/10ML suspension Take 10 mLs (1 g total) by mouth 4 (four) times daily. 04/27/22 04/27/23  Lanelle Bal, DO  traMADol (ULTRAM) 50 MG tablet Take 1 tablet (50 mg total) by mouth 3 (three) times daily as needed. 07/27/22   Cristie Hem, PA-C  varenicline (CHANTIX) 1 MG tablet Take 1 tablet (1 mg total) by mouth 2 (two) times daily. 09/12/22   Milford, Anderson Malta, FNP  [name] 1 g capsule Take 2 capsules (2 g total) by mouth 2 (two) times daily. 08/18/22   Laurey Morale, MD    Physical Exam    Vital Signs:  Virgilio Belling does not have vital signs available for review today.  Given telephonic nature of communication, physical exam is limited. AAOx3. NAD. Normal affect.  Speech and respirations are unlabored.  Accessory Clinical Findings    None  Assessment & Plan    1.  Preoperative Cardiovascular Risk Assessment:  Not completed   The patient was advised that if she develops new symptoms prior to surgery to contact our office to arrange for a follow-up visit, and she verbalized understanding.    A copy of this note will be routed to requesting surgeon.  Time:   Today, I have spent 0 minutes with the patient with telehealth technology discussing medical history, symptoms, and management plan.     Sharlene Dory, PA-C  02/24/2023, 11:39 AM

## 2023-03-02 ENCOUNTER — Ambulatory Visit
Admission: RE | Admit: 2023-03-02 | Discharge: 2023-03-02 | Disposition: A | Discharge status: Home / Self Care | Payer: Medicare PPO | Source: Ambulatory Visit | Attending: Orthopedic Surgery | Admitting: Orthopedic Surgery

## 2023-03-02 DIAGNOSIS — I82210 Acute embolism and thrombosis of superior vena cava: Secondary | ICD-10-CM | POA: Diagnosis not present

## 2023-03-02 DIAGNOSIS — R911 Solitary pulmonary nodule: Secondary | ICD-10-CM | POA: Diagnosis not present

## 2023-03-02 DIAGNOSIS — Z09 Encounter for follow-up examination after completed treatment for conditions other than malignant neoplasm: Secondary | ICD-10-CM

## 2023-03-02 DIAGNOSIS — I3131 Malignant pericardial effusion in diseases classified elsewhere: Secondary | ICD-10-CM | POA: Diagnosis not present

## 2023-03-02 MED ORDER — IOPAMIDOL (ISOVUE-300) INJECTION 61%
100.0000 mL | Freq: Once | INTRAVENOUS | Status: AC | PRN
Start: 2023-03-02 — End: 2023-03-02
  Administered 2023-03-02: 65 mL via INTRAVENOUS

## 2023-03-07 ENCOUNTER — Telehealth: Payer: Self-pay

## 2023-03-07 NOTE — Progress Notes (Signed)
Remote ICD transmission.   

## 2023-03-07 NOTE — Telephone Encounter (Signed)
Referred to ICM clinic by Dr Elberta Fortis to resume ICM monthly follow up.   Spoke with patient and advised Dr Elberta Fortis has requested to resume ICM monthly ICM follow up.  She agreed to follow up and scheduled 1st remote transmission for 11/11.  She has shoulder surgery on 11/12.

## 2023-03-08 ENCOUNTER — Other Ambulatory Visit: Payer: Self-pay | Admitting: Internal Medicine

## 2023-03-08 ENCOUNTER — Other Ambulatory Visit: Payer: Self-pay | Admitting: Gastroenterology

## 2023-03-08 DIAGNOSIS — G6289 Other specified polyneuropathies: Secondary | ICD-10-CM

## 2023-03-13 ENCOUNTER — Encounter: Payer: Self-pay | Admitting: Cardiology

## 2023-03-13 NOTE — Progress Notes (Signed)
PERIOPERATIVE PRESCRIPTION FOR IMPLANTED CARDIAC DEVICE PROGRAMMING  Patient Information: Name:  Erin [name]  DOB:  16-Sep-1960  MRN:  [id number]    Planned Procedure:  left reverse shoulder arthroplasty, biceps tenodesis  Surgeon:  Dr. August Saucer  Date of Procedure:  03/21/23  Cautery will be used.  Position during surgery:  supine   Please send documentation back to:  [facility] (Fax # [phone])   Device Information:  Clinic EP Physician:  Loman Brooklyn, MD   Device Type:  Defibrillator Manufacturer and Phone #:  Medtronic: [id number] Pacemaker Dependent?:  No. Date of Last Device Check:  02/15/23 Normal Device Function?:  Yes.    Electrophysiologist's Recommendations:  Have magnet available. Provide continuous ECG monitoring when magnet is used or reprogramming is to be performed.  Procedure will likely interfere with device function.  Device should be programmed:  Tachy therapies disabled  Per Device Clinic Standing Orders, Lenor Coffin, RN  4:39 PM 03/13/2023

## 2023-03-13 NOTE — Progress Notes (Signed)
Cardiac device orders requested. Orders received. Medtronic rep notified via e-mail to be present on the day of surgery

## 2023-03-13 NOTE — Pre-Procedure Instructions (Signed)
Surgical Instructions   Your procedure is scheduled on Tuesday, November 12th. Report to [facility] Main Entrance "A" at 05:30 A.M., then check in with the Admitting office. Any questions or running late day of surgery: call [phone]  Questions prior to your surgery date: call [phone], Monday-Friday, 8am-4pm. If you experience any cold or flu symptoms such as cough, fever, chills, shortness of breath, etc. between now and your scheduled surgery, please notify us at the above number.     Remember:  Do not eat after midnight the night before your surgery  You may drink clear liquids until 04:30 AM the morning of your surgery.   Clear liquids allowed are: Water, Non-Citrus Juices (without pulp), Carbonated Beverages, Clear Tea, Black Coffee Only (NO MILK, CREAM OR POWDERED CREAMER of any kind), and Gatorade.  Patient Instructions  The night before surgery:  No food after midnight. ONLY clear liquids after midnight  The day of surgery (if you do NOT have diabetes):  Drink ONE (1) Pre-Surgery Clear Ensure by 04:30 AM the morning of surgery. Drink in one sitting. Do not sip.  This drink was given to you during your hospital  pre-op appointment visit.  Nothing else to drink after completing the  Pre-Surgery Clear Ensure.         If you have questions, please contact your surgeon's office.    Take these medicines the morning of surgery with A SIP OF WATER  atorvastatin (LIPITOR)  cyproheptadine (PERIACTIN)  fenofibrate (TRICOR)  metoprolol succinate (TOPROL XL)  pantoprazole (PROTONIX)  [name]   May take these medicines IF NEEDED: nitroGLYCERIN (NITROSTAT)- if you have to take this medication prior to surgery, please call one of the above phone numbers and report this to a Nurse ondansetron (ZOFRAN)  traMADol (ULTRAM)    Follow your surgeon's instructions on when to stop Aspirin and Plavix.  If no instructions were given by your surgeon then you will need to call the  office to get those instructions.     Hold dapagliflozin propanediol (FARXIGA) for 72 hours prior to surgery. Last dose will be 11/8.   One week prior to surgery, STOP taking any Aleve, Naproxen, Ibuprofen, Motrin, Advil, Goody's, BC's, all herbal medications, fish oil, and non-prescription vitamins.                     Do NOT Smoke (Tobacco/Vaping) for 24 hours prior to your procedure.  If you use a CPAP at night, you may bring your mask/headgear for your overnight stay.   You will be asked to remove any contacts, glasses, piercing's, hearing aid's, dentures/partials prior to surgery. Please bring cases for these items if needed.    Patients discharged the day of surgery will not be allowed to drive home, and someone needs to stay with them for 24 hours.  SURGICAL WAITING ROOM VISITATION Patients may have no more than 2 support people in the waiting area - these visitors may rotate.   Pre-op nurse will coordinate an appropriate time for 1 ADULT support person, who may not rotate, to accompany patient in pre-op.  Children under the age of 63 must have an adult with them who is not the patient and must remain in the main waiting area with an adult.  If the patient needs to stay at the hospital during part of their recovery, the visitor guidelines for inpatient rooms apply.  Please refer to the [facility] website for the visitor guidelines for any additional information.  If you received a COVID test during your pre-op visit  it is requested that you wear a mask when out in public, stay away from anyone that may not be feeling well and notify your surgeon if you develop symptoms. If you have been in contact with anyone that has tested positive in the last 10 days please notify you surgeon.      Pre-operative 5 CHG Bathing Instructions   You can play a key role in reducing the risk of infection after surgery. Your skin needs to be as free of germs as possible. You can reduce the  number of germs on your skin by washing with CHG (chlorhexidine gluconate) soap before surgery. CHG is an antiseptic soap that kills germs and continues to kill germs even after washing.   DO NOT use if you have an allergy to chlorhexidine/CHG or antibacterial soaps. If your skin becomes reddened or irritated, stop using the CHG and notify one of our RNs at [phone].   Please shower with the CHG soap starting 4 days before surgery using the following schedule:     Please keep in mind the following:  DO NOT shave, including legs and underarms, starting the day of your first shower.   You may shave your face at any point before/day of surgery.  Place clean sheets on your bed the day you start using CHG soap. Use a clean washcloth (not used since being washed) for each shower. DO NOT sleep with pets once you start using the CHG.   CHG Shower Instructions:  Wash your face and private area with normal soap. If you choose to wash your hair, wash first with your normal shampoo.  After you use shampoo/soap, rinse your hair and body thoroughly to remove shampoo/soap residue.  Turn the water OFF and apply about 3 tablespoons (45 ml) of CHG soap to a CLEAN washcloth.  Apply CHG soap ONLY FROM YOUR NECK DOWN TO YOUR TOES (washing for 3-5 minutes)  DO NOT use CHG soap on face, private areas, open wounds, or sores.  Pay special attention to the area where your surgery is being performed.  If you are having back surgery, having someone wash your back for you may be helpful. Wait 2 minutes after CHG soap is applied, then you may rinse off the CHG soap.  Pat dry with a clean towel  Put on clean clothes/pajamas   If you choose to wear lotion, please use ONLY the CHG-compatible lotions on the back of this paper.   Additional instructions for the day of surgery: DO NOT APPLY any lotions, deodorants, cologne, or perfumes.   Do not bring valuables to the hospital. [facility] is not responsible for any  belongings/valuables. Do not wear nail polish, gel polish, artificial nails, or any other type of covering on natural nails (fingers and toes) Do not wear jewelry or makeup Put on clean/comfortable clothes.  Please brush your teeth.  Ask your nurse before applying any prescription medications to the skin.     CHG Compatible Lotions   Aveeno Moisturizing lotion  Cetaphil Moisturizing Cream  Cetaphil Moisturizing Lotion  Clairol Herbal Essence Moisturizing Lotion, Dry Skin  Clairol Herbal Essence Moisturizing Lotion, Extra Dry Skin  Clairol Herbal Essence Moisturizing Lotion, Normal Skin  Curel Age Defying Therapeutic Moisturizing Lotion with Alpha Hydroxy  Curel Extreme Care Body Lotion  Curel Soothing Hands Moisturizing Hand Lotion  Curel Therapeutic Moisturizing Cream, Fragrance-Free  Curel Therapeutic Moisturizing Lotion, Fragrance-Free  Curel Therapeutic Moisturizing Lotion,  Original Formula  Eucerin Daily Replenishing Lotion  Eucerin Dry Skin Therapy Plus Alpha Hydroxy Crme  Eucerin Dry Skin Therapy Plus Alpha Hydroxy Lotion  Eucerin Original Crme  Eucerin Original Lotion  Eucerin Plus Crme Eucerin Plus Lotion  Eucerin TriLipid Replenishing Lotion  Keri Anti-Bacterial Hand Lotion  Keri Deep Conditioning Original Lotion Dry Skin Formula Softly Scented  Keri Deep Conditioning Original Lotion, Fragrance Free Sensitive Skin Formula  Keri Lotion Fast Absorbing Fragrance Free Sensitive Skin Formula  Keri Lotion Fast Absorbing Softly Scented Dry Skin Formula  Keri Original Lotion  Keri Skin Renewal Lotion Keri Silky Smooth Lotion  Keri Silky Smooth Sensitive Skin Lotion  Nivea Body Creamy Conditioning Oil  Nivea Body Extra Enriched Lotion  Nivea Body Original Lotion  Nivea Body Sheer Moisturizing Lotion Nivea Crme  Nivea Skin Firming Lotion  NutraDerm 30 Skin Lotion  NutraDerm Skin Lotion  NutraDerm Therapeutic Skin Cream  NutraDerm Therapeutic Skin Lotion  ProShield  Protective Hand Cream  Provon moisturizing lotion  [place]- Preparing for Total Shoulder Arthroplasty  Before surgery, you can play an important role. Because skin is not sterile, your skin needs to be as free of germs as possible. You can reduce the number of germs on your skin by using the following products.   Benzoyl Peroxide Gel  o Reduces the number of germs present on the skin  o Applied twice a day to shoulder area starting two days before surgery ==================================================================  Please follow these instructions carefully:  BENZOYL PEROXIDE 5% GEL  Please do not use if you have an allergy to benzoyl peroxide. If your skin becomes reddened/irritated stop using the benzoyl peroxide.  Starting two days before surgery, apply as follows:  1. Apply benzoyl peroxide in the morning and at night. Apply after taking a shower. If you are not taking a shower clean entire shoulder front, back, and side along with the armpit with a clean wet washcloth.  2. Place a quarter-sized dollop on your SHOULDER and rub in thoroughly, making sure to cover the front, back, and side of your shoulder, along with the armpit.   2 Days prior to Surgery First Dose on _____________ Morning Second Dose on ______________ Night  Day Before Surgery First Dose on ______________ Morning Second Dose on ____________ Night    3. Do NOT apply benzoyl peroxide gel on the day of surgery    Please read over the following fact sheets that you were given.

## 2023-03-14 ENCOUNTER — Encounter (HOSPITAL_COMMUNITY): Payer: Self-pay

## 2023-03-14 ENCOUNTER — Encounter (HOSPITAL_COMMUNITY)
Admission: RE | Admit: 2023-03-14 | Discharge: 2023-03-14 | Disposition: A | Discharge status: Home / Self Care | Payer: Medicare PPO | Source: Ambulatory Visit | Attending: Orthopedic Surgery | Admitting: Orthopedic Surgery

## 2023-03-14 ENCOUNTER — Other Ambulatory Visit: Payer: Self-pay

## 2023-03-14 VITALS — BP 93/61 | HR 93 | Temp 98.5°F | Resp 17 | Ht 60.0 in | Wt 109.1 lb

## 2023-03-14 DIAGNOSIS — Z01818 Encounter for other preprocedural examination: Secondary | ICD-10-CM | POA: Insufficient documentation

## 2023-03-14 LAB — BASIC METABOLIC PANEL
Anion gap: 13 (ref 5–15)
BUN: 21 mg/dL — ABNORMAL HIGH (ref 6–20)
CO2: 26 mmol/L (ref 22–32)
Calcium: 10.1 mg/dL (ref 8.9–10.3)
Chloride: 101 mmol/L (ref 98–111)
Creatinine, Ser: 1.75 mg/dL — ABNORMAL HIGH (ref 0.44–1.00)
GFR, Estimated: 33 mL/min — ABNORMAL LOW (ref >60)
Glucose, Bld: 99 mg/dL (ref 70–99)
Potassium: 3.9 mmol/L (ref 3.5–5.1)
Sodium: 140 mmol/L (ref 135–145)

## 2023-03-14 LAB — URINALYSIS, W/ REFLEX TO CULTURE (INFECTION SUSPECTED)
Bacteria, UA: NONE SEEN
Bilirubin Urine: NEGATIVE
Glucose, UA: >=500 mg/dL — AB
Hgb urine dipstick: NEGATIVE
Ketones, ur: NEGATIVE mg/dL
Leukocytes,Ua: NEGATIVE
Nitrite: NEGATIVE
Protein, ur: NEGATIVE mg/dL
Specific Gravity, Urine: 1.007 (ref 1.005–1.030)
pH: 6 (ref 5.0–8.0)

## 2023-03-14 LAB — CBC
HCT: 36.6 % (ref 36.0–46.0)
Hemoglobin: 12.2 g/dL (ref 12.0–15.0)
MCH: 27.8 pg (ref 26.0–34.0)
MCHC: 33.3 g/dL (ref 30.0–36.0)
MCV: 83.4 fL (ref 80.0–100.0)
Platelets: 501 10*3/uL — ABNORMAL HIGH (ref 150–400)
RBC: 4.39 MIL/uL (ref 3.87–5.11)
RDW: 17.2 % — ABNORMAL HIGH (ref 11.5–15.5)
WBC: 9 10*3/uL (ref 4.0–10.5)
nRBC: 0 % (ref 0.0–0.2)

## 2023-03-14 LAB — SURGICAL PCR SCREEN
MRSA, PCR: NEGATIVE
Staphylococcus aureus: NEGATIVE

## 2023-03-14 NOTE — Progress Notes (Addendum)
PCP - Dr. Christel Mormon Cardiologist - Dr. Marca Ancona Electrophysiologist - Dr. Loman Brooklyn  PPM/ICD - ICD Medtronic Device Orders - Device orders received and placed in chart Rep Notified - Rep notified via email with device orders  Chest x-ray - n/a EKG - 01/24/2023 Stress Test - 03/07/2019 ECHO - 06/13/2022 Cardiac Cath - 08/19/2019  Sleep Study - Denies CPAP - n/a  No DM  Last dose of GLP1 agonist- n/a GLP1 instructions: n/a  Pt has not received instructions on stopping ASA or Plavix. Attempted to contact Harriette Bouillon, PA for Dr. August Saucer during PAT visit, but no response. Pt instructed to call surgeon office to receive instructions  ERAS Protcol -Clear liquids until 0430 morning of surgery PRE-SURGERY Ensure or G2- Ensure given to pt with instructions  COVID TEST- n/a   Anesthesia review: Yes. Cardiac Clearance. Hx of MI, CHF, ICD Implant. Abnormal creatinine result.  Patient denies shortness of breath, fever, cough and chest pain at PAT appointment. Pt denies any respiratory illness/infection in the last two months.    All instructions explained to the patient, with a verbal understanding of the material. Patient agrees to go over the instructions while at home for a better understanding. Patient also instructed to self quarantine after being tested for COVID-19. The opportunity to ask questions was provided.

## 2023-03-15 ENCOUNTER — Encounter: Payer: Self-pay | Admitting: Orthopedic Surgery

## 2023-03-15 ENCOUNTER — Telehealth: Payer: Self-pay

## 2023-03-15 ENCOUNTER — Telehealth: Payer: Self-pay | Admitting: Cardiology

## 2023-03-15 NOTE — Anesthesia Preprocedure Evaluation (Addendum)
Anesthesia Evaluation  Patient identified by MRN, date of birth, ID band Patient awake    Reviewed: Allergy & Precautions, NPO status , Patient's Chart, lab work & pertinent test results  Airway Mallampati: II  TM Distance: >3 FB Neck ROM: Full    Dental no notable dental hx. (+) Upper Dentures, Dental Advisory Given   Pulmonary Current Smoker and Patient abstained from smoking.   Pulmonary exam normal breath sounds clear to auscultation       Cardiovascular hypertension, + CAD, + Past MI, + Cardiac Stents and +CHF  Normal cardiovascular exam+ Cardiac Defibrillator  Rhythm:Regular Rate:Normal  TTE 06/13/2022: 1. Left ventricular ejection fraction, by estimation, is 30 to 35%. The  left ventricle has moderately decreased function. The left ventricle  demonstrates regional wall motion abnormalities with basal to mid  inferolateral and anterolateral akinesis.  There is mild concentric left ventricular hypertrophy. Left ventricular  diastolic parameters are consistent with Grade I diastolic dysfunction  (impaired relaxation).  2. Right ventricular systolic function is normal. The right ventricular  size is normal. There is normal pulmonary artery systolic pressure. The  estimated right ventricular systolic pressure is 28.4 mmHg.  3. The mitral valve is normal in structure. Mild mitral valve  regurgitation. No evidence of mitral stenosis.  4. The aortic valve is tricuspid. Aortic valve regurgitation is not  visualized. No aortic stenosis is present.  5. The inferior vena cava is normal in size with greater than 50%  respiratory variability, suggesting right atrial pressure of 3 mmHg.    Neuro/Psych   Anxiety        GI/Hepatic ,GERD  ,,  Endo/Other    Renal/GU Renal InsufficiencyRenal disease     Musculoskeletal  (+) Arthritis , Osteoarthritis,    Abdominal   Peds  Hematology   Anesthesia Other Findings    Reproductive/Obstetrics                             Anesthesia Physical Anesthesia Plan  ASA: 4  Anesthesia Plan: General and Regional   Post-op Pain Management: Regional block* and Minimal or no pain anticipated   Induction: Intravenous  PONV Risk Score and Plan: Treatment may vary due to age or medical condition, Midazolam and Ondansetron  Airway Management Planned: Oral ETT  Additional Equipment: Arterial line  Intra-op Plan:   Post-operative Plan: Extubation in OR  Informed Consent: I have reviewed the patients History and Physical, chart, labs and discussed the procedure including the risks, benefits and alternatives for the proposed anesthesia with the patient or authorized representative who has indicated his/her understanding and acceptance.     Dental advisory given  Plan Discussed with: CRNA  Anesthesia Plan Comments: (GA ETT aline +L ISB w exparel  PAT note by Antionette Poles, PA-C: "She can hold Plavix for surgery, on it for cerebral circulation issues but no interventions for a long time. Ok for surgery if no new symptoms."  Patient reported she did not have instructions on holding Plavix.  She was instructed to reach out to surgeon's office ASAP.  I do see telephone encounter from 03/15/2023 where she has reached out for input. I also left a VM with Dr. Diamantina Providence scheduler requesting they reach out to the pt with instructions.   Other pertinent history includes someday smoker, GERD, alpha gal allergy, CKD 3 by labs.  Preop labs reviewed, creatinine elevated 1.75 (most recent comparison 1.62 on 01/24/2023), otherwise unremarkable.  EKG 01/24/23: Normal  sinus rhythm. Rate 73. Right atrial enlargement  Perioperative prescription for implanted cardiac device programming per progress note 03/13/2023: Device Information:  Clinic EP Physician:  Loman Brooklyn, MD   Device Type:  Defibrillator Manufacturer and Phone #:  Medtronic:  [phone] Pacemaker Dependent?:  No. Date of Last Device Check:  02/15/23         Normal Device Function?:  Yes.    Electrophysiologist's Recommendations:   Have magnet available.  Provide continuous ECG monitoring when magnet is used or reprogramming is to be performed.   Procedure will likely interfere with device function.  Device should be programmed:  Tachy therapies disabled    Carotid duplex 04/25/2022: Summary:  Right Carotid: Velocities in the right ICA are consistent with a 1-39%  stenosis.   Left Carotid: Velocities in the left ICA are consistent with a 40-59%  stenosis.   Cath 08/19/2019: 1. Normal filling pressures.  2. Preserved cardiac output.  3. No obstructive CAD, patent stents.  No explanation for chest pain, ?vasospasm.     )        Anesthesia Quick Evaluation

## 2023-03-15 NOTE — Telephone Encounter (Signed)
Spoke with patient about her mychart message and she stated that she was contacted by cardiologist office and was advised to d/c BT tonight until her surgery and Dr August Saucer to advise after surgery when she may resume.

## 2023-03-15 NOTE — Telephone Encounter (Signed)
Caller Leotis Shames) stated they will need to know how long to hold patient's Plavix for surgery on 03/21/23.

## 2023-03-15 NOTE — Progress Notes (Signed)
Anesthesia Chart Review:  62 year old female follows with cardiology for history of CAD s/p NSTEMI with PCI to proximal/mid LCx and RCA, ischemic cardiomyopathy s/p Medtronic dual-chamber pacemaker ICD, HTN, PAD. LHC in 4/21 showed nonobstructive mild CAD.  Echo 2/24 showed stable EF 30-35%.  Clearance from Dr. Shirlee Latch 01/31/2023 dates, "She can hold Plavix for surgery, on it for cerebral circulation issues but no interventions for a long time. Ok for surgery if no new symptoms."  Patient reported she did not have instructions on holding Plavix.  She was instructed to reach out to surgeon's office ASAP.  I do see telephone encounter from 03/15/2023 where she has reached out for input. I also left a VM with Dr. Diamantina Providence scheduler requesting they reach out to the pt with instructions.   Other pertinent history includes someday smoker, GERD, alpha gal allergy, CKD 3 by labs.  Preop labs reviewed, creatinine elevated 1.75 (most recent comparison 1.62 on 01/24/2023), otherwise unremarkable.  EKG 01/24/23: Normal sinus rhythm. Rate 73. Right atrial enlargement  Perioperative prescription for implanted cardiac device programming per progress note 03/13/2023: Device Information:   Clinic EP Physician:  Loman Brooklyn, MD    Device Type:  Defibrillator Manufacturer and Phone #:  Medtronic: [phone] Pacemaker Dependent?:  No. Date of Last Device Check:  02/15/23         Normal Device Function?:  Yes.     Electrophysiologist's Recommendations:   Have magnet available. Provide continuous ECG monitoring when magnet is used or reprogramming is to be performed.  Procedure will likely interfere with device function.  Device should be programmed:  Tachy therapies disabled  TTE 06/13/2022:  1. Left ventricular ejection fraction, by estimation, is 30 to 35%. The  left ventricle has moderately decreased function. The left ventricle  demonstrates regional wall motion abnormalities with basal to mid   inferolateral and anterolateral akinesis.  There is mild concentric left ventricular hypertrophy. Left ventricular  diastolic parameters are consistent with Grade I diastolic dysfunction  (impaired relaxation).   2. Right ventricular systolic function is normal. The right ventricular  size is normal. There is normal pulmonary artery systolic pressure. The  estimated right ventricular systolic pressure is 28.4 mmHg.   3. The mitral valve is normal in structure. Mild mitral valve  regurgitation. No evidence of mitral stenosis.   4. The aortic valve is tricuspid. Aortic valve regurgitation is not  visualized. No aortic stenosis is present.   5. The inferior vena cava is normal in size with greater than 50%  respiratory variability, suggesting right atrial pressure of 3 mmHg.   Carotid duplex 04/25/2022: Summary:  Right Carotid: Velocities in the right ICA are consistent with a 1-39%  stenosis.   Left Carotid: Velocities in the left ICA are consistent with a 40-59%  stenosis.   Cath 08/19/2019: 1. Normal filling pressures.  2. Preserved cardiac output.  3. No obstructive CAD, patent stents.  No explanation for chest pain, ?vasospasm.      Zannie Cove [facility] Short Stay Center/Anesthesiology Phone [phone] 03/15/2023 2:30 PM

## 2023-03-15 NOTE — Telephone Encounter (Signed)
I s/w the pt and she has been added on to tomorrow 03/16/23 per Robin Searing, NP @ 11:30. Pt has been informed ok to take Plavix today, however she will need to begin holding Plavix as of tomorrow until after her surgery. Pt has been made aware the surgeon will advise as to when it safe to resume Plavix.   I will update the requesting office as well pt has tele appt tomorrow.

## 2023-03-15 NOTE — Telephone Encounter (Signed)
PER PER OP APPT Robin Searing, NP: Ms Homer can be added on for tomorrow to make up for her missed appointment.

## 2023-03-15 NOTE — Telephone Encounter (Signed)
   Name: MASIYAH JORSTAD  DOB: 1960-12-22  MRN: [id number]  Primary Cardiologist: Marca Ancona, MD   Preoperative team, please contact this patient and set up a phone call appointment for further preoperative risk assessment. Please obtain consent and complete medication review. Thank you for your help.  Patient was a no-show for last scheduled phone call  I confirm that guidance regarding antiplatelet and oral anticoagulation therapy has been completed and, if necessary, noted below.  Patient can hold Plavix 5 days prior to scheduled procedure and should restart postprocedure when surgically safe.  I also confirmed the patient resides in the state of [place]. As per [facility] Medical Board telemedicine laws, the patient must reside in the state in which the provider is licensed.   Napoleon Form, Leodis Rains, NP 03/15/2023, 1:34 PM [facility]

## 2023-03-16 ENCOUNTER — Ambulatory Visit: Payer: Medicare PPO | Attending: Nurse Practitioner

## 2023-03-16 DIAGNOSIS — Z01810 Encounter for preprocedural cardiovascular examination: Secondary | ICD-10-CM

## 2023-03-16 NOTE — Progress Notes (Signed)
Virtual Visit via Telephone Note   Because of Erin [name]'s co-morbid illnesses, she is at least at moderate risk for complications without adequate follow up.  This format is felt to be most appropriate for this patient at this time.  The patient did not have access to video technology/had technical difficulties with video requiring transitioning to audio format only (telephone).  All issues noted in this document were discussed and addressed.  No physical exam could be performed with this format.  Please refer to the patient's chart for her consent to telehealth for [facility].  Evaluation Performed:  Preoperative cardiovascular risk assessment _____________   Date:  03/16/2023   Patient ID:  Erin [name], Erin [name] Oct 06, 1960, MRN [id number] Patient Location:  Home Provider location:   Office  Primary Care Provider:  Billie Lade, MD Primary Cardiologist:  Marca Ancona, MD  Chief Complaint / Patient Profile   62 y.o. y/o female with a h/o ICM s/p ICD, PAD, mitral MR, CAD s/p NSTEMI 2017 with PCI/DES to proximal/mid circumflex and RCA x 2, HFpEF, PCOM aneurysm s/p embolization 2020 who is pending left reverse shoulder arthroplasty and presents today for telephonic preoperative cardiovascular risk assessment.  History of Present Illness    Erin [name] is a 62 y.o. female who presents via Web designer for a telehealth visit today.  Pt was last seen in cardiology clinic on 12/19/2022 by Dr. Shirlee Latch.  At that time Erin [name] was doing well with no shortness of breath or chest pain.  Patient's weight was down 11 pounds due to poor appetite.  She had quit smoking on Chantix however restarted again and was working on quitting.  The patient is now pending procedure as outlined above. Since her last visit, she has been doing well with no new cardiac complaints.  She is staying active and does jog short distances with her daughter and also works part-time at  [employer].  She reports that her blood pressure has been stable and most recent reading was 115/76  She denies chest pain, shortness of breath, lower extremity edema, fatigue, palpitations, melena, hematuria, hemoptysis, diaphoresis, weakness, presyncope, syncope, orthopnea, and PND.   Past Medical History    Past Medical History:  Diagnosis Date   AICD (automatic cardioverter/defibrillator) present    Allergic reaction to alpha-gal    Allergy 03/05/2022   Anemia    Anxiety state 03/25/2016   Arthritis    Basal cell carcinoma of forehead    Brain aneurysm    CHF (congestive heart failure) (HCC)    Coronary artery disease    a. 03/11/16 PCI with DES-->Prox/Mid Cx;  b. 03/14/16 PCI with DES x2-->RCA, EF 30-35%.   Depression 06/09/2010   Encounter for general adult medical examination with abnormal findings 05/04/2022   Essential hypertension    Hx   GERD (gastroesophageal reflux disease)    HFrEF (heart failure with reduced ejection fraction) (HCC)    a. 10/2016 Echo: EF 35-40%, Gr1 DD, mild focal basal septal hypertrophy, basal inflat, mid inflat, basal antlat AK. Mid infept/inf/antlat, apical lateral sev HK. Mod MR. mildly reduced RV fxn. Mild TR.   History of pneumonia    Hyperlipidemia    IBS (irritable bowel syndrome)    Ischemic cardiomyopathy    a. 10/2016 Echo: EF 35-40%, Gr1 DD.   Mitral regurgitation    Neuromuscular disorder [facility])    NSTEMI (non-ST elevated myocardial infarction) (HCC) 03/10/2016   Pneumonia 03/2016   Shingles  Squamous cell cancer of skin of nose    Thrombocytosis 03/26/2016   Tobacco abuse    Trichimoniasis    Wears dentures    Wears glasses    Past Surgical History:  Procedure Laterality Date   APPENDECTOMY     BIOPSY  09/20/2018   Procedure: BIOPSY;  Surgeon: Corbin Ade, MD;  [facility];  Service: Endoscopy;;  colon   BIOPSY  01/05/2021   Procedure: BIOPSY;  Surgeon: Rachael Fee, MD;  Location: [facility] ENDOSCOPY;  Service:  Endoscopy;;   BIOPSY  04/27/2022   Procedure: BIOPSY;  Surgeon: Lanelle Bal, DO;  [facility];  Service: Endoscopy;;   CARDIAC CATHETERIZATION N/A 03/11/2016   Procedure: Left Heart Cath and Coronary Angiography;  Surgeon: Marykay Lex, MD;  Location: [facility] INVASIVE CV LAB;  Service: Cardiovascular;  Laterality: N/A;   CARDIAC CATHETERIZATION N/A 03/11/2016   Procedure: Coronary Stent Intervention;  Surgeon: Marykay Lex, MD;  Location: [facility] INVASIVE CV LAB;  Service: Cardiovascular;  Laterality: N/A;   CARDIAC CATHETERIZATION N/A 03/14/2016   Procedure: Coronary Stent Intervention;  Surgeon: Peter M Swaziland, MD;  Location: [facility] INVASIVE CV LAB;  Service: Cardiovascular;  Laterality: N/A;   CEREBRAL ANEURYSM REPAIR  04/2019   stent placed   CHOLECYSTECTOMY OPEN  1984   COLONOSCOPY WITH PROPOFOL N/A 09/20/2018   Procedure: COLONOSCOPY WITH PROPOFOL;  Surgeon: Corbin Ade, MD;  [facility];  Service: Endoscopy;  Laterality: N/A;  10:30am   CORONARY ANGIOPLASTY WITH STENT PLACEMENT  03/14/2016   ESOPHAGOGASTRODUODENOSCOPY (EGD) WITH PROPOFOL N/A 09/20/2018   Procedure: ESOPHAGOGASTRODUODENOSCOPY (EGD) WITH PROPOFOL;  Surgeon: Corbin Ade, MD;  [facility];  Service: Endoscopy;  Laterality: N/A;   ESOPHAGOGASTRODUODENOSCOPY (EGD) WITH PROPOFOL N/A 01/05/2021   Procedure: ESOPHAGOGASTRODUODENOSCOPY (EGD) WITH PROPOFOL;  Surgeon: Rachael Fee, MD;  Location: [facility] ENDOSCOPY;  Service: Endoscopy;  Laterality: N/A;   ESOPHAGOGASTRODUODENOSCOPY (EGD) WITH PROPOFOL N/A 04/27/2022   Procedure: ESOPHAGOGASTRODUODENOSCOPY (EGD) WITH PROPOFOL;  Surgeon: Lanelle Bal, DO;  [facility];  Service: Endoscopy;  Laterality: N/A;  9:15am, asa 3/4, ASAP   ESOPHAGOGASTRODUODENOSCOPY (EGD) WITH PROPOFOL N/A 01/04/2023   Procedure: ESOPHAGOGASTRODUODENOSCOPY (EGD) WITH PROPOFOL;  Surgeon: Corbin Ade, MD;  [facility];  Service:  Endoscopy;  Laterality: N/A;  130pm, asa 3   FINGER ARTHROPLASTY Left 05/14/2013   Procedure: LEFT THUMB CARPAL METACARPAL ARTHROPLASTY;  Surgeon: Tami Ribas, MD;  Location: [facility];  Service: Orthopedics;  Laterality: Left;   ICD IMPLANT N/A 04/03/2020   Procedure: ICD IMPLANT;  Surgeon: Regan Lemming, MD;  Location: [facility] INVASIVE CV LAB;  Service: Cardiovascular;  Laterality: N/A;   IR ANGIO INTRA EXTRACRAN SEL COM CAROTID INNOMINATE BILAT MOD SED  01/05/2017   IR ANGIO INTRA EXTRACRAN SEL COM CAROTID INNOMINATE BILAT MOD SED  03/19/2019   IR ANGIO INTRA EXTRACRAN SEL COM CAROTID INNOMINATE BILAT MOD SED  06/04/2020   IR ANGIO INTRA EXTRACRAN SEL INTERNAL CAROTID UNI L MOD SED  04/15/2019   IR ANGIO VERTEBRAL SEL VERTEBRAL BILAT MOD SED  01/05/2017   IR ANGIO VERTEBRAL SEL VERTEBRAL BILAT MOD SED  03/19/2019   IR ANGIO VERTEBRAL SEL VERTEBRAL UNI L MOD SED  06/04/2020   IR ANGIOGRAM FOLLOW UP STUDY  04/15/2019   IR RADIOLOGIST EVAL & MGMT  12/30/2016   IR TRANSCATH/EMBOLIZ  04/15/2019   IR US GUIDE VASC ACCESS RIGHT  03/19/2019   IR US  GUIDE VASC ACCESS RIGHT  06/04/2020   MALONEY DILATION N/A 09/20/2018   Procedure: Elease Hashimoto DILATION;  Surgeon: Corbin Ade, MD;  [facility];  Service: Endoscopy;  Laterality: N/A;   MALONEY DILATION N/A 01/04/2023   Procedure: Elease Hashimoto DILATION;  Surgeon: Corbin Ade, MD;  [facility];  Service: Endoscopy;  Laterality: N/A;   RADIOLOGY WITH ANESTHESIA N/A 04/15/2019   Procedure: Sharman Crate;  Surgeon: Julieanne Cotton, MD;  Location: [facility] OR;  Service: Radiology;  Laterality: N/A;   RIGHT/LEFT HEART CATH AND CORONARY ANGIOGRAPHY N/A 08/19/2019   Procedure: RIGHT/LEFT HEART CATH AND CORONARY ANGIOGRAPHY;  Surgeon: Laurey Morale, MD;  Location: [facility] INVASIVE CV LAB;  Service: Cardiovascular;  Laterality: N/A;   TUBAL LIGATION  1987   VAGINAL HYSTERECTOMY  2009    Allergies  Allergies  Allergen  Reactions   Alpha-Gal Shortness Of Breath, Nausea And Vomiting and Dermatitis   Other Shortness Of Breath, Diarrhea, Nausea And Vomiting and Nausea Only    All- red meats  Medications in Capsule form    Tape Other (See Comments)    PEELS SKIN OFF  (PAPER TAPE IS FINE)    Home Medications    Prior to Admission medications   Medication Sig Start Date End Date Taking? Authorizing Provider  aspirin EC 81 MG tablet Take 81 mg by mouth every evening.     [provider]  atorvastatin (LIPITOR) 80 MG tablet Take 1 tablet (80 mg total) by mouth daily. 09/05/22   Laurey Morale, MD  clopidogrel (PLAVIX) 75 MG tablet Take 1 tablet by mouth once daily 07/12/22   Laurey Morale, MD  cyproheptadine (PERIACTIN) 4 MG tablet Take 8 mg by mouth 2 (two) times daily. 03/08/23   [provider]  dapagliflozin propanediol (FARXIGA) 10 MG TABS tablet Take 1 tablet (10 mg total) by mouth daily before breakfast. 09/12/22   Milford, Anderson Malta, FNP  EPINEPHrine (EPIPEN 2-PAK) 0.3 mg/0.3 mL IJ SOAJ injection Inject 0.3 mg into the muscle as needed for anaphylaxis. 04/23/21   Alfonse Spruce, MD  Evolocumab (REPATHA SURECLICK) 140 MG/ML SOAJ Inject 140 mg into the skin every 14 (fourteen) days. 09/07/22   Laurey Morale, MD  fenofibrate (TRICOR) 145 MG tablet Take 1 tablet (145 mg total) by mouth daily. 07/27/22   Laurey Morale, MD  furosemide (LASIX) 20 MG tablet Take 1 tablet (20 mg total) by mouth daily. 06/13/22   Laurey Morale, MD  gabapentin (NEURONTIN) 300 MG capsule Take 1 capsule by mouth at bedtime 03/08/23   Billie Lade, MD  linaclotide [place]) 145 MCG CAPS capsule Take 1 capsule (145 mcg total) by mouth daily before breakfast. 11/22/22   Rourk, Gerrit Friends, MD  loperamide (IMODIUM A-D) 2 MG tablet Take 2 mg by mouth 4 (four) times daily as needed for diarrhea or loose stools.    [provider]  losartan (COZAAR) 25 MG tablet Take 1 tablet (25 mg total) by mouth at  bedtime. 09/12/22   Milford, Anderson Malta, FNP  metoprolol succinate (TOPROL XL) 25 MG 24 hr tablet Take 1 tablet (25 mg total) by mouth daily. 12/19/22   Laurey Morale, MD  Multiple Vitamins-Minerals (MULTIVITAMIN WITH MINERALS) tablet Take 1 tablet by mouth daily.    [provider]  nitroGLYCERIN (NITROSTAT) 0.4 MG SL tablet Place 1 tablet (0.4 mg total) under the tongue every 5 (five) minutes x 3 doses as needed for chest pain (if no  relief after 2nd dose, proceed to the ED for an evaluation or call 911). 09/19/22   Laurey Morale, MD  ondansetron (ZOFRAN) 4 MG tablet Take 1 tablet (4 mg total) by mouth every 6 (six) hours as needed for nausea or vomiting. 11/23/22   Rourk, Gerrit Friends, MD  pantoprazole (PROTONIX) 40 MG tablet TAKE 1 TABLET BY MOUTH TWICE DAILY BEFORE A MEAL 10/17/22   Gelene Mink, NP  potassium chloride SA (KLOR-CON M) 20 MEQ tablet Take 1 tablet (20 mEq total) by mouth 2 (two) times daily. 07/11/22   Laurey Morale, MD  rOPINIRole (REQUIP) 0.5 MG tablet Take 0.5 mg by mouth at bedtime.  11/13/17   [provider]  scopolamine (TRANSDERM-SCOP) 1 MG/3DAYS Place 1 patch (1.5 mg total) onto the skin every 3 (three) days. 01/12/23   Judithann Sheen, PA  spironolactone (ALDACTONE) 25 MG tablet Take 1 tablet (25 mg total) by mouth at bedtime. 03/07/22   Laurey Morale, MD  sucralfate (CARAFATE) 1 GM/10ML suspension Take 10 mLs (1 g total) by mouth 4 (four) times daily. 04/27/22 04/27/23  Lanelle Bal, DO  traMADol (ULTRAM) 50 MG tablet Take 1 tablet (50 mg total) by mouth 3 (three) times daily as needed. 07/27/22   Cristie Hem, PA-C  varenicline (CHANTIX) 1 MG tablet Take 1 tablet (1 mg total) by mouth 2 (two) times daily. 09/12/22   Milford, Anderson Malta, FNP  [name] 1 g capsule Take 2 capsules (2 g total) by mouth 2 (two) times daily. 08/18/22   Laurey Morale, MD  Vitamin D, Ergocalciferol, (DRISDOL) 1.25 MG (50000 UNIT) CAPS capsule Take 50,000 Units by mouth every  7 (seven) days.    [provider]    Physical Exam    Vital Signs:  Erin [name] does not have vital signs available for review today.115/76  Given telephonic nature of communication, physical exam is limited. AAOx3. NAD. Normal affect.  Speech and respirations are unlabored.  Accessory Clinical Findings    None  Assessment & Plan    1.  Preoperative Cardiovascular Risk Assessment: -Patient's RCRI score is 11%  The patient affirms she has been doing well without any new cardiac symptoms. They are able to achieve 7 METS without cardiac limitations. Therefore, based on ACC/AHA guidelines, the patient would be at acceptable risk for the planned procedure without further cardiovascular testing. The patient was advised that if she develops new symptoms prior to surgery to contact our office to arrange for a follow-up visit, and she verbalized understanding.   The patient was advised that if she develops new symptoms prior to surgery to contact our office to arrange for a follow-up visit, and she verbalized understanding.  Patient can hold Plavix 5 days prior to procedure for procedure if symptom-free and should restart postprocedure when surgically safe  A copy of this note will be routed to requesting surgeon.  Time:   Today, I have spent 7 minutes with the patient with telehealth technology discussing medical history, symptoms, and management plan.     Napoleon Form, Leodis Rains, NP  03/16/2023, 7:24 AM

## 2023-03-20 ENCOUNTER — Telehealth: Payer: Self-pay | Admitting: *Deleted

## 2023-03-20 ENCOUNTER — Ambulatory Visit (INDEPENDENT_AMBULATORY_CARE_PROVIDER_SITE_OTHER): Payer: Medicare PPO

## 2023-03-20 DIAGNOSIS — I5042 Chronic combined systolic (congestive) and diastolic (congestive) heart failure: Secondary | ICD-10-CM | POA: Diagnosis not present

## 2023-03-20 DIAGNOSIS — Z95810 Presence of automatic (implantable) cardiac defibrillator: Secondary | ICD-10-CM

## 2023-03-20 NOTE — Telephone Encounter (Signed)
Ortho bundle pre-op call completed. 

## 2023-03-20 NOTE — Care Plan (Signed)
[facility] RNCM call to patient to discuss her upcoming Left reverse shoulder arthroplasty with Dr. August Saucer at [facility] on 03/21/23. She is agreeable to case management. She lives with her spouse, who drives big rigs and is unsure if he can assist, but her daughter and son will be assisting her at home. She will have a shoulder abduction sling provided by [employer] delivered today. Reviewed all post op care instructions and questions answered. Will continue to follow for needs.

## 2023-03-20 NOTE — Progress Notes (Signed)
Spoke with the pt, she will arrive tom at 0900, NPO post midnight and to stop clear liquids at 0800.

## 2023-03-21 ENCOUNTER — Observation Stay (HOSPITAL_COMMUNITY)
Admission: RE | Admit: 2023-03-21 | Discharge: 2023-03-22 | Disposition: A | Discharge status: Home / Self Care | Payer: Medicare PPO | Attending: Orthopedic Surgery | Admitting: Orthopedic Surgery

## 2023-03-21 ENCOUNTER — Ambulatory Visit (HOSPITAL_COMMUNITY): Payer: Medicare PPO | Admitting: Physician Assistant

## 2023-03-21 ENCOUNTER — Other Ambulatory Visit: Payer: Self-pay

## 2023-03-21 ENCOUNTER — Observation Stay (HOSPITAL_COMMUNITY): Payer: Medicare PPO

## 2023-03-21 ENCOUNTER — Encounter (HOSPITAL_COMMUNITY): Payer: Self-pay | Admitting: Orthopedic Surgery

## 2023-03-21 ENCOUNTER — Encounter (HOSPITAL_COMMUNITY): Payer: Medicare PPO

## 2023-03-21 ENCOUNTER — Encounter (HOSPITAL_COMMUNITY)
Admission: RE | Disposition: A | Discharge status: Home / Self Care | Payer: Self-pay | Source: Home / Self Care | Attending: Orthopedic Surgery

## 2023-03-21 DIAGNOSIS — Z7902 Long term (current) use of antithrombotics/antiplatelets: Secondary | ICD-10-CM | POA: Insufficient documentation

## 2023-03-21 DIAGNOSIS — M7522 Bicipital tendinitis, left shoulder: Secondary | ICD-10-CM | POA: Diagnosis not present

## 2023-03-21 DIAGNOSIS — M19012 Primary osteoarthritis, left shoulder: Principal | ICD-10-CM

## 2023-03-21 DIAGNOSIS — I110 Hypertensive heart disease with heart failure: Secondary | ICD-10-CM | POA: Insufficient documentation

## 2023-03-21 DIAGNOSIS — I5020 Unspecified systolic (congestive) heart failure: Secondary | ICD-10-CM | POA: Diagnosis not present

## 2023-03-21 DIAGNOSIS — Z955 Presence of coronary angioplasty implant and graft: Secondary | ICD-10-CM | POA: Insufficient documentation

## 2023-03-21 DIAGNOSIS — Z7982 Long term (current) use of aspirin: Secondary | ICD-10-CM | POA: Insufficient documentation

## 2023-03-21 DIAGNOSIS — I2510 Atherosclerotic heart disease of native coronary artery without angina pectoris: Secondary | ICD-10-CM

## 2023-03-21 DIAGNOSIS — I5022 Chronic systolic (congestive) heart failure: Secondary | ICD-10-CM

## 2023-03-21 DIAGNOSIS — Z96612 Presence of left artificial shoulder joint: Secondary | ICD-10-CM

## 2023-03-21 DIAGNOSIS — Z85828 Personal history of other malignant neoplasm of skin: Secondary | ICD-10-CM | POA: Diagnosis not present

## 2023-03-21 DIAGNOSIS — M19019 Primary osteoarthritis, unspecified shoulder: Secondary | ICD-10-CM | POA: Diagnosis present

## 2023-03-21 DIAGNOSIS — Z01818 Encounter for other preprocedural examination: Principal | ICD-10-CM

## 2023-03-21 DIAGNOSIS — I5023 Acute on chronic systolic (congestive) heart failure: Secondary | ICD-10-CM | POA: Diagnosis not present

## 2023-03-21 DIAGNOSIS — G8918 Other acute postprocedural pain: Secondary | ICD-10-CM | POA: Diagnosis not present

## 2023-03-21 HISTORY — PX: BICEPT TENODESIS: SHX5116

## 2023-03-21 HISTORY — PX: REVERSE SHOULDER ARTHROPLASTY: SHX5054

## 2023-03-21 SURGERY — ARTHROPLASTY, SHOULDER, TOTAL, REVERSE
Anesthesia: Regional | Site: Shoulder | Laterality: Left

## 2023-03-21 MED ORDER — TRANEXAMIC ACID-NACL 1000-0.7 MG/100ML-% IV SOLN
INTRAVENOUS | Status: AC
  Filled 2023-03-21: qty 100

## 2023-03-21 MED ORDER — ACETAMINOPHEN 325 MG PO TABS
325.0000 mg | ORAL_TABLET | Freq: Four times a day (QID) | ORAL | Status: DC | PRN
  Administered 2023-03-22: 650 mg via ORAL
  Filled 2023-03-21: qty 2

## 2023-03-21 MED ORDER — LACTATED RINGERS IV SOLN: INTRAVENOUS | Status: DC

## 2023-03-21 MED ORDER — ROCURONIUM BROMIDE 10 MG/ML (PF) SYRINGE
PREFILLED_SYRINGE | INTRAVENOUS | Status: DC | PRN
  Administered 2023-03-21: 50 mg via INTRAVENOUS

## 2023-03-21 MED ORDER — LIDOCAINE 2% (20 MG/ML) 5 ML SYRINGE
INTRAMUSCULAR | Status: AC
  Filled 2023-03-21: qty 5

## 2023-03-21 MED ORDER — CYPROHEPTADINE HCL 4 MG PO TABS
8.0000 mg | ORAL_TABLET | Freq: Two times a day (BID) | ORAL | Status: DC
  Administered 2023-03-21 – 2023-03-22 (×2): 8 mg via ORAL
  Filled 2023-03-21 (×3): qty 2

## 2023-03-21 MED ORDER — PHENYLEPHRINE HCL-NACL 20-0.9 MG/250ML-% IV SOLN
INTRAVENOUS | Status: DC | PRN
  Administered 2023-03-21: 25 ug/min via INTRAVENOUS

## 2023-03-21 MED ORDER — FENTANYL CITRATE (PF) 250 MCG/5ML IJ SOLN
INTRAMUSCULAR | Status: DC | PRN
  Administered 2023-03-21: 75 ug via INTRAVENOUS

## 2023-03-21 MED ORDER — PHENOL 1.4 % MT LIQD: 1.0000 | OROMUCOSAL | Status: DC | PRN

## 2023-03-21 MED ORDER — METHOCARBAMOL 500 MG PO TABS: 500.0000 mg | ORAL_TABLET | Freq: Four times a day (QID) | ORAL | Status: DC | PRN

## 2023-03-21 MED ORDER — IRRISEPT - 450ML BOTTLE WITH 0.05% CHG IN STERILE WATER, USP 99.95% OPTIME
TOPICAL | Status: DC | PRN
  Administered 2023-03-21: 450 mL via TOPICAL

## 2023-03-21 MED ORDER — TRANEXAMIC ACID-NACL 1000-0.7 MG/100ML-% IV SOLN: 1000.0000 mg | INTRAVENOUS | Status: DC

## 2023-03-21 MED ORDER — SUGAMMADEX SODIUM 200 MG/2ML IV SOLN
INTRAVENOUS | Status: DC | PRN
  Administered 2023-03-21: 200 mg via INTRAVENOUS

## 2023-03-21 MED ORDER — PHENYLEPHRINE 80 MCG/ML (10ML) SYRINGE FOR IV PUSH (FOR BLOOD PRESSURE SUPPORT)
PREFILLED_SYRINGE | INTRAVENOUS | Status: DC | PRN
  Administered 2023-03-21 (×3): 80 ug via INTRAVENOUS
  Administered 2023-03-21: 160 ug via INTRAVENOUS

## 2023-03-21 MED ORDER — OXYCODONE HCL 5 MG PO TABS: 5.0000 mg | ORAL_TABLET | Freq: Once | ORAL | Status: DC | PRN

## 2023-03-21 MED ORDER — FENTANYL CITRATE (PF) 250 MCG/5ML IJ SOLN
INTRAMUSCULAR | Status: AC
  Filled 2023-03-21: qty 5

## 2023-03-21 MED ORDER — POTASSIUM CHLORIDE CRYS ER 20 MEQ PO TBCR
20.0000 meq | EXTENDED_RELEASE_TABLET | Freq: Two times a day (BID) | ORAL | Status: DC
  Administered 2023-03-21 – 2023-03-22 (×2): 20 meq via ORAL
  Filled 2023-03-21 (×2): qty 1

## 2023-03-21 MED ORDER — FENTANYL CITRATE (PF) 100 MCG/2ML IJ SOLN: 50.0000 ug | Freq: Once | INTRAMUSCULAR | Status: DC

## 2023-03-21 MED ORDER — LOPERAMIDE HCL 2 MG PO CAPS: 2.0000 mg | ORAL_CAPSULE | Freq: Four times a day (QID) | ORAL | Status: DC | PRN

## 2023-03-21 MED ORDER — 0.9 % SODIUM CHLORIDE (POUR BTL) OPTIME
TOPICAL | Status: DC | PRN
  Administered 2023-03-21 (×4): 1000 mL

## 2023-03-21 MED ORDER — GABAPENTIN 300 MG PO CAPS
300.0000 mg | ORAL_CAPSULE | Freq: Every day | ORAL | Status: DC
  Administered 2023-03-21: 300 mg via ORAL
  Filled 2023-03-21: qty 1

## 2023-03-21 MED ORDER — BUPIVACAINE LIPOSOME 1.3 % IJ SUSP
INTRAMUSCULAR | Status: DC | PRN
  Administered 2023-03-21: 10 mL

## 2023-03-21 MED ORDER — OXYCODONE HCL 5 MG/5ML PO SOLN
5.0000 mg | Freq: Once | ORAL | Status: DC | PRN
Start: 2023-03-21 — End: 2023-03-21

## 2023-03-21 MED ORDER — METOCLOPRAMIDE HCL 5 MG PO TABS
5.0000 mg | ORAL_TABLET | Freq: Three times a day (TID) | ORAL | Status: DC | PRN
Start: 2023-03-21 — End: 2023-03-22

## 2023-03-21 MED ORDER — LOSARTAN POTASSIUM 25 MG PO TABS
25.0000 mg | ORAL_TABLET | Freq: Every day | ORAL | Status: DC
  Administered 2023-03-21: 25 mg via ORAL
  Filled 2023-03-21 (×2): qty 1

## 2023-03-21 MED ORDER — ONDANSETRON HCL 4 MG/2ML IJ SOLN: 4.0000 mg | Freq: Once | INTRAMUSCULAR | Status: DC | PRN

## 2023-03-21 MED ORDER — DEXAMETHASONE SODIUM PHOSPHATE 10 MG/ML IJ SOLN
INTRAMUSCULAR | Status: DC | PRN
  Administered 2023-03-21: 5 mg via INTRAVENOUS

## 2023-03-21 MED ORDER — PROPOFOL 10 MG/ML IV BOLUS
INTRAVENOUS | Status: AC
  Filled 2023-03-21: qty 20

## 2023-03-21 MED ORDER — CEFAZOLIN SODIUM-DEXTROSE 2-4 GM/100ML-% IV SOLN
2.0000 g | Freq: Three times a day (TID) | INTRAVENOUS | Status: AC
  Administered 2023-03-21 – 2023-03-22 (×3): 2 g via INTRAVENOUS
  Filled 2023-03-21 (×3): qty 100

## 2023-03-21 MED ORDER — FUROSEMIDE 20 MG PO TABS
20.0000 mg | ORAL_TABLET | Freq: Every day | ORAL | Status: DC
  Administered 2023-03-21: 20 mg via ORAL
  Filled 2023-03-21: qty 1

## 2023-03-21 MED ORDER — BUPIVACAINE HCL (PF) 0.5 % IJ SOLN
INTRAMUSCULAR | Status: DC | PRN
  Administered 2023-03-21: 14 mL via PERINEURAL

## 2023-03-21 MED ORDER — HYDROMORPHONE HCL 1 MG/ML IJ SOLN: 0.2500 mg | INTRAMUSCULAR | Status: DC | PRN

## 2023-03-21 MED ORDER — VARENICLINE TARTRATE 1 MG PO TABS
1.0000 mg | ORAL_TABLET | Freq: Two times a day (BID) | ORAL | Status: DC
  Administered 2023-03-21: 1 mg via ORAL
  Filled 2023-03-21 (×3): qty 1

## 2023-03-21 MED ORDER — NITROGLYCERIN 0.4 MG SL SUBL: 0.4000 mg | SUBLINGUAL_TABLET | SUBLINGUAL | Status: DC | PRN

## 2023-03-21 MED ORDER — DOCUSATE SODIUM 100 MG PO CAPS
100.0000 mg | ORAL_CAPSULE | Freq: Two times a day (BID) | ORAL | Status: DC
  Administered 2023-03-21 – 2023-03-22 (×2): 100 mg via ORAL
  Filled 2023-03-21 (×2): qty 1

## 2023-03-21 MED ORDER — MENTHOL 3 MG MT LOZG
1.0000 | LOZENGE | OROMUCOSAL | Status: DC | PRN
Start: 2023-03-21 — End: 2023-03-22

## 2023-03-21 MED ORDER — ACETAMINOPHEN 10 MG/ML IV SOLN: 750.0000 mg | Freq: Once | INTRAVENOUS | Status: DC

## 2023-03-21 MED ORDER — LINACLOTIDE 145 MCG PO CAPS
145.0000 ug | ORAL_CAPSULE | Freq: Every day | ORAL | Status: DC
  Filled 2023-03-21 (×2): qty 1

## 2023-03-21 MED ORDER — SPIRONOLACTONE 25 MG PO TABS
25.0000 mg | ORAL_TABLET | Freq: Every day | ORAL | Status: DC
  Administered 2023-03-21: 25 mg via ORAL
  Filled 2023-03-21: qty 1

## 2023-03-21 MED ORDER — VANCOMYCIN HCL 1000 MG IV SOLR
INTRAVENOUS | Status: AC
Start: 2023-03-21 — End: ?
  Filled 2023-03-21: qty 20

## 2023-03-21 MED ORDER — ROPINIROLE HCL 0.5 MG PO TABS
0.5000 mg | ORAL_TABLET | Freq: Every day | ORAL | Status: DC
  Administered 2023-03-21: 0.5 mg via ORAL
  Filled 2023-03-21: qty 1

## 2023-03-21 MED ORDER — ROCURONIUM BROMIDE 10 MG/ML (PF) SYRINGE
PREFILLED_SYRINGE | INTRAVENOUS | Status: AC
  Filled 2023-03-21: qty 10

## 2023-03-21 MED ORDER — METOCLOPRAMIDE HCL 5 MG/ML IJ SOLN: 5.0000 mg | Freq: Three times a day (TID) | INTRAMUSCULAR | Status: DC | PRN

## 2023-03-21 MED ORDER — ACETAMINOPHEN 500 MG PO TABS
1000.0000 mg | ORAL_TABLET | Freq: Four times a day (QID) | ORAL | Status: DC
  Administered 2023-03-21 – 2023-03-22 (×3): 1000 mg via ORAL
  Filled 2023-03-21 (×4): qty 2

## 2023-03-21 MED ORDER — POVIDONE-IODINE 10 % EX SWAB
2.0000 | Freq: Once | CUTANEOUS | Status: AC
  Administered 2023-03-21: 2 via TOPICAL

## 2023-03-21 MED ORDER — PROPOFOL 10 MG/ML IV BOLUS
INTRAVENOUS | Status: DC | PRN
  Administered 2023-03-21: 80 mg via INTRAVENOUS

## 2023-03-21 MED ORDER — ONDANSETRON HCL 4 MG/2ML IJ SOLN
INTRAMUSCULAR | Status: AC
  Filled 2023-03-21: qty 2

## 2023-03-21 MED ORDER — AMISULPRIDE (ANTIEMETIC) 5 MG/2ML IV SOLN: 10.0000 mg | Freq: Once | INTRAVENOUS | Status: DC | PRN

## 2023-03-21 MED ORDER — ONDANSETRON HCL 4 MG PO TABS: 4.0000 mg | ORAL_TABLET | Freq: Four times a day (QID) | ORAL | Status: DC | PRN

## 2023-03-21 MED ORDER — OXYCODONE HCL 5 MG PO TABS
5.0000 mg | ORAL_TABLET | ORAL | Status: DC | PRN
  Administered 2023-03-22: 5 mg via ORAL
  Filled 2023-03-21: qty 1

## 2023-03-21 MED ORDER — LIDOCAINE 2% (20 MG/ML) 5 ML SYRINGE
INTRAMUSCULAR | Status: DC | PRN
  Administered 2023-03-21: 100 mg via INTRAVENOUS

## 2023-03-21 MED ORDER — ASPIRIN 81 MG PO TBEC: 81.0000 mg | DELAYED_RELEASE_TABLET | Freq: Every evening | ORAL | Status: DC

## 2023-03-21 MED ORDER — POVIDONE-IODINE 7.5 % EX SOLN
Freq: Once | CUTANEOUS | Status: DC
  Filled 2023-03-21: qty 118

## 2023-03-21 MED ORDER — VANCOMYCIN HCL 1000 MG IV SOLR
INTRAVENOUS | Status: DC | PRN
  Administered 2023-03-21: 1000 mg via TOPICAL

## 2023-03-21 MED ORDER — HYDROMORPHONE HCL 1 MG/ML IJ SOLN: 0.5000 mg | INTRAMUSCULAR | Status: DC | PRN

## 2023-03-21 MED ORDER — CEFAZOLIN SODIUM-DEXTROSE 2-4 GM/100ML-% IV SOLN
INTRAVENOUS | Status: AC
  Filled 2023-03-21: qty 100

## 2023-03-21 MED ORDER — METOPROLOL SUCCINATE ER 25 MG PO TB24
25.0000 mg | ORAL_TABLET | Freq: Every day | ORAL | Status: DC
  Administered 2023-03-21: 25 mg via ORAL
  Filled 2023-03-21: qty 1

## 2023-03-21 MED ORDER — DAPAGLIFLOZIN PROPANEDIOL 10 MG PO TABS
10.0000 mg | ORAL_TABLET | Freq: Every day | ORAL | Status: DC
  Administered 2023-03-22: 10 mg via ORAL
  Filled 2023-03-21 (×2): qty 1

## 2023-03-21 MED ORDER — FENTANYL CITRATE (PF) 100 MCG/2ML IJ SOLN
INTRAMUSCULAR | Status: AC
  Administered 2023-03-21: 50 ug
  Filled 2023-03-21: qty 2

## 2023-03-21 MED ORDER — MIDAZOLAM HCL 2 MG/2ML IJ SOLN
INTRAMUSCULAR | Status: AC
  Filled 2023-03-21: qty 2

## 2023-03-21 MED ORDER — CLOPIDOGREL BISULFATE 75 MG PO TABS
75.0000 mg | ORAL_TABLET | Freq: Every day | ORAL | Status: DC
  Administered 2023-03-21: 75 mg via ORAL
  Filled 2023-03-21 (×2): qty 1

## 2023-03-21 MED ORDER — ONDANSETRON HCL 4 MG/2ML IJ SOLN
INTRAMUSCULAR | Status: DC | PRN
  Administered 2023-03-21: 4 mg via INTRAVENOUS

## 2023-03-21 MED ORDER — CEFAZOLIN SODIUM-DEXTROSE 2-4 GM/100ML-% IV SOLN
2.0000 g | INTRAVENOUS | Status: AC
  Administered 2023-03-21: 2 g via INTRAVENOUS

## 2023-03-21 MED ORDER — CHLORHEXIDINE GLUCONATE 0.12 % MT SOLN
OROMUCOSAL | Status: AC
  Administered 2023-03-21: 15 mL
  Filled 2023-03-21: qty 15

## 2023-03-21 MED ORDER — METHOCARBAMOL 1000 MG/10ML IJ SOLN: 500.0000 mg | Freq: Four times a day (QID) | INTRAMUSCULAR | Status: DC | PRN

## 2023-03-21 MED ORDER — DEXAMETHASONE SODIUM PHOSPHATE 10 MG/ML IJ SOLN
INTRAMUSCULAR | Status: AC
  Filled 2023-03-21: qty 1

## 2023-03-21 SURGICAL SUPPLY — 77 items
AID PSTN UNV HD RSTRNT DISP (MISCELLANEOUS) ×1
ALCOHOL 70% 16 OZ (MISCELLANEOUS) ×1 IMPLANT
APL PRP STRL LF DISP 70% ISPRP (MISCELLANEOUS) ×1
AUG COMP REV MI TAPER ADAPTER (Joint) ×1 IMPLANT
AUGMENT COMP REV MI TAPR ADPTR (Joint) IMPLANT
BAG COUNTER SPONGE SURGICOUNT (BAG) ×1 IMPLANT
BAG SPNG CNTER NS LX DISP (BAG) ×1
BEARING HUMERAL SHLDER 36M STD (Shoulder) IMPLANT
BIT DRILL 1.5X85 (BIT) IMPLANT
BIT DRILL 2.7 W/STOP DISP (BIT) IMPLANT
BIT DRILL TWIST 2.7 (BIT) IMPLANT
BLADE SAW SGTL 13X75X1.27 (BLADE) ×1 IMPLANT
BRNG HUM STD 36 RVRS SHLDR (Shoulder) ×1 IMPLANT
BSPLAT GLND SM AUG TPR ADPR (Joint) ×1 IMPLANT
CHLORAPREP W/TINT 26 (MISCELLANEOUS) ×1 IMPLANT
COOLER ICEMAN CLASSIC (MISCELLANEOUS) ×1 IMPLANT
COVER SURGICAL LIGHT HANDLE (MISCELLANEOUS) ×1 IMPLANT
DRAPE INCISE IOBAN 66X45 STRL (DRAPES) ×1 IMPLANT
DRAPE U-SHAPE 47X51 STRL (DRAPES) ×2 IMPLANT
DRSG AQUACEL AG ADV 3.5X10 (GAUZE/BANDAGES/DRESSINGS) ×1 IMPLANT
ELECT BLADE 4.0 EZ CLEAN MEGAD (MISCELLANEOUS) ×1
ELECT REM PT RETURN 9FT ADLT (ELECTROSURGICAL) ×1
ELECTRODE BLDE 4.0 EZ CLN MEGD (MISCELLANEOUS) ×1 IMPLANT
ELECTRODE REM PT RTRN 9FT ADLT (ELECTROSURGICAL) ×1 IMPLANT
GAUZE SPONGE 4X4 12PLY STRL LF (GAUZE/BANDAGES/DRESSINGS) ×1 IMPLANT
GLENOID SPHERE STD STRL 36MM (Orthopedic Implant) IMPLANT
GLOVE BIOGEL PI IND STRL 6.5 (GLOVE) ×1 IMPLANT
GLOVE BIOGEL PI IND STRL 8 (GLOVE) ×1 IMPLANT
GLOVE ECLIPSE 6.5 STRL STRAW (GLOVE) ×1 IMPLANT
GLOVE ECLIPSE 8.0 STRL XLNG CF (GLOVE) ×1 IMPLANT
GOWN STRL REUS W/ TWL LRG LVL3 (GOWN DISPOSABLE) ×1 IMPLANT
GOWN STRL REUS W/ TWL XL LVL3 (GOWN DISPOSABLE) ×1 IMPLANT
GOWN STRL REUS W/TWL LRG LVL3 (GOWN DISPOSABLE) ×1
GOWN STRL REUS W/TWL XL LVL3 (GOWN DISPOSABLE) ×1
GUIDE RSA SHLD BM ROT L SU (ORTHOPEDIC DISPOSABLE SUPPLIES) IMPLANT
HYDROGEN PEROXIDE 16OZ (MISCELLANEOUS) ×1 IMPLANT
JET LAVAGE IRRISEPT WOUND (IRRIGATION / IRRIGATOR) ×1
KIT BASIN OR (CUSTOM PROCEDURE TRAY) ×1 IMPLANT
KIT TURNOVER KIT B (KITS) ×1 IMPLANT
LAVAGE JET IRRISEPT WOUND (IRRIGATION / IRRIGATOR) ×1 IMPLANT
MANIFOLD NEPTUNE II (INSTRUMENTS) ×1 IMPLANT
NDL SUT 6 .5 CRC .975X.05 MAYO (NEEDLE) IMPLANT
NEEDLE MAYO TAPER (NEEDLE) ×1
NS IRRIG 1000ML POUR BTL (IV SOLUTION) ×1 IMPLANT
PACK SHOULDER (CUSTOM PROCEDURE TRAY) ×1 IMPLANT
PAD COLD SHLDR WRAP-ON (PAD) ×1 IMPLANT
PIN STEINMANN THREADED TIP (PIN) IMPLANT
PIN THREADED REVERSE (PIN) IMPLANT
REAMER GUIDE BUSHING SURG DISP (MISCELLANEOUS) IMPLANT
REAMER GUIDE W/SCREW AUG (MISCELLANEOUS) IMPLANT
RESTRAINT HEAD UNIVERSAL NS (MISCELLANEOUS) ×1 IMPLANT
RETRIEVER SUT HEWSON (MISCELLANEOUS) ×1 IMPLANT
SCREW CENTRAL 6.5X20MM (Screw) IMPLANT
SCREW LOCKING 4.75MMX15MM (Screw) IMPLANT
SHOULDER HUMERAL BEAR 36M STD (Shoulder) ×1 IMPLANT
SLING ARM IMMOBILIZER LRG (SOFTGOODS) ×1 IMPLANT
SOL PREP POV-IOD 4OZ 10% (MISCELLANEOUS) ×1 IMPLANT
SPONGE T-LAP 18X18 ~~LOC~~+RFID (SPONGE) ×1 IMPLANT
STEM HUMERAL STRL 8MMX83MM (Stem) IMPLANT
STRIP CLOSURE SKIN 1/2X4 (GAUZE/BANDAGES/DRESSINGS) ×1 IMPLANT
SUCTION TUBE FRAZIER 10FR DISP (SUCTIONS) ×1 IMPLANT
SUT BROADBAND TAPE 2PK 1.5 (SUTURE) IMPLANT
SUT ETHILON 3 0 PS 1 (SUTURE) IMPLANT
SUT MNCRL AB 3-0 PS2 18 (SUTURE) ×1 IMPLANT
SUT SILK 2 0 TIES 10X30 (SUTURE) ×1 IMPLANT
SUT VIC AB 0 CT1 27 (SUTURE) ×4
SUT VIC AB 0 CT1 27XBRD ANBCTR (SUTURE) ×4 IMPLANT
SUT VIC AB 1 CT1 27 (SUTURE) ×2
SUT VIC AB 1 CT1 27XBRD ANBCTR (SUTURE) ×2 IMPLANT
SUT VIC AB 2-0 CT1 27 (SUTURE)
SUT VIC AB 2-0 CT1 TAPERPNT 27 (SUTURE) ×3 IMPLANT
SUT VIC AB 2-0 CT2 27 (SUTURE) IMPLANT
SUT VIC AB CT1 27XBRD ANBCTRL (SUTURE) ×4
SUT VICRYL 0 UR6 27IN ABS (SUTURE) ×2 IMPLANT
TOWEL GREEN STERILE (TOWEL DISPOSABLE) ×1 IMPLANT
TRAY HUM REV SHOULDER STD +6 (Shoulder) IMPLANT
WATER STERILE IRR 1000ML POUR (IV SOLUTION) ×1 IMPLANT

## 2023-03-21 NOTE — Anesthesia Procedure Notes (Signed)
Arterial Line Insertion Start/End11/04/2023 10:30 AM, 03/21/2023 10:40 AM Performed by: Loleta Tamella Tuccillo, CRNA, CRNA  Patient location: Pre-op. Preanesthetic checklist: patient identified, IV checked, site marked, risks and benefits discussed, surgical consent, monitors and equipment checked, pre-op evaluation, timeout performed and anesthesia consent Lidocaine 1% used for infiltration radial was placed Catheter size: 20 G Hand hygiene performed  and maximum sterile barriers used   Attempts: 3 Procedure performed using ultrasound guided technique. Following insertion, dressing applied and Biopatch. Post procedure assessment: normal and unchanged

## 2023-03-21 NOTE — Progress Notes (Signed)
Patient transferred from PACU at 1635 PM. Alert and oriented. On RA. Sling is on L arm. Will continue to monitor.

## 2023-03-21 NOTE — Progress Notes (Signed)
Pt has voided 3 times since surgery. Greater than 250 ml all three times. Pt is able to walk without assistance. Pt has good CMS to the LUE. Pt states she is starting to feel pain and tingling in her fingers, she can wiggle her fingers and cap refill is less than 3 sec.

## 2023-03-21 NOTE — H&P (Signed)
Erin [name] Erin [name] is an 62 y.o. female.   Chief Complaint: Left shoulder pain HPI: Erin [name] is a 62 y.o. female who presents to the office reporting bilateral shoulder pain.  Left is worse than right.  She is right-hand dominant.  Has had chronic pain for 3 years.  Pain does radiate down to the elbow with occasional numbness and tingling in her fingers.  Denies any neck pain but does report some scapular muscle spasm and tenderness.  The pain wakes her from sleep at night.  She has to have help with ADLs including fixing her hair and getting dressed.  She had a defibrillator placed in 2020.  EF is around 30 to 35%.  The defibrillator has never gone off.  She did have an injection last year which helped her for several days..   Past Medical History:  Diagnosis Date   AICD (automatic cardioverter/defibrillator) present    Allergic reaction to alpha-gal    Allergy 03/05/2022   Anemia    Anxiety state 03/25/2016   Arthritis    Basal cell carcinoma of forehead    Brain aneurysm    CHF (congestive heart failure) (HCC)    Coronary artery disease    a. 03/11/16 PCI with DES-->Prox/Mid Cx;  b. 03/14/16 PCI with DES x2-->RCA, EF 30-35%.   Depression 06/09/2010   Encounter for general adult medical examination with abnormal findings 05/04/2022   Essential hypertension    Hx   GERD (gastroesophageal reflux disease)    HFrEF (heart failure with reduced ejection fraction) (HCC)    a. 10/2016 Echo: EF 35-40%, Gr1 DD, mild focal basal septal hypertrophy, basal inflat, mid inflat, basal antlat AK. Mid infept/inf/antlat, apical lateral sev HK. Mod MR. mildly reduced RV fxn. Mild TR.   History of pneumonia    Hyperlipidemia    IBS (irritable bowel syndrome)    Ischemic cardiomyopathy    a. 10/2016 Echo: EF 35-40%, Gr1 DD.   Mitral regurgitation    Neuromuscular disorder (HCC)    NSTEMI (non-ST elevated myocardial infarction) (HCC) 03/10/2016   Pneumonia 03/2016   Shingles    Squamous cell cancer of  skin of nose    Thrombocytosis 03/26/2016   Tobacco abuse    Trichimoniasis    Wears dentures    Wears glasses     Past Surgical History:  Procedure Laterality Date   APPENDECTOMY     BIOPSY  09/20/2018   Procedure: BIOPSY;  Surgeon: Corbin Ade, MD;  [facility]: [facility];  Service: Endoscopy;;  colon   BIOPSY  01/05/2021   Procedure: BIOPSY;  Surgeon: Rachael Fee, MD;  Location: [facility] ENDOSCOPY;  Service: Endoscopy;;   BIOPSY  04/27/2022   Procedure: BIOPSY;  Surgeon: Lanelle Bal, DO;  Location: [facility];  Service: Endoscopy;;   CARDIAC CATHETERIZATION N/A 03/11/2016   Procedure: Left Heart Cath and Coronary Angiography;  Surgeon: Marykay Lex, MD;  Location: [facility] INVASIVE CV LAB;  Service: Cardiovascular;  Laterality: N/A;   CARDIAC CATHETERIZATION N/A 03/11/2016   Procedure: Coronary Stent Intervention;  Surgeon: Marykay Lex, MD;  Location: [facility] INVASIVE CV LAB;  Service: Cardiovascular;  Laterality: N/A;   CARDIAC CATHETERIZATION N/A 03/14/2016   Procedure: Coronary Stent Intervention;  Surgeon: Peter M Swaziland, MD;  Location: [facility] INVASIVE CV LAB;  Service: Cardiovascular;  Laterality: N/A;   CEREBRAL ANEURYSM REPAIR  04/2019   stent placed   CHOLECYSTECTOMY OPEN  1984   COLONOSCOPY WITH PROPOFOL N/A 09/20/2018   Procedure:  COLONOSCOPY WITH PROPOFOL;  Surgeon: Corbin Ade, MD;  [facility]: [facility];  Service: Endoscopy;  Laterality: N/A;  10:30am   CORONARY ANGIOPLASTY WITH STENT PLACEMENT  03/14/2016   ESOPHAGOGASTRODUODENOSCOPY (EGD) WITH PROPOFOL N/A 09/20/2018   Procedure: ESOPHAGOGASTRODUODENOSCOPY (EGD) WITH PROPOFOL;  Surgeon: Corbin Ade, MD;  [facility];  Service: Endoscopy;  Laterality: N/A;   ESOPHAGOGASTRODUODENOSCOPY (EGD) WITH PROPOFOL N/A 01/05/2021   Procedure: ESOPHAGOGASTRODUODENOSCOPY (EGD) WITH PROPOFOL;  Surgeon: Rachael Fee, MD;  Location: [facility] ENDOSCOPY;  Service: Endoscopy;  Laterality: N/A;    ESOPHAGOGASTRODUODENOSCOPY (EGD) WITH PROPOFOL N/A 04/27/2022   Procedure: ESOPHAGOGASTRODUODENOSCOPY (EGD) WITH PROPOFOL;  Surgeon: Lanelle Bal, DO;  [facility]: [facility];  Service: Endoscopy;  Laterality: N/A;  9:15am, asa 3/4, ASAP   ESOPHAGOGASTRODUODENOSCOPY (EGD) WITH PROPOFOL N/A 01/04/2023   Procedure: ESOPHAGOGASTRODUODENOSCOPY (EGD) WITH PROPOFOL;  Surgeon: Corbin Ade, MD;  [facility];  Service: Endoscopy;  Laterality: N/A;  130pm, asa 3   FINGER ARTHROPLASTY Left 05/14/2013   Procedure: LEFT THUMB CARPAL METACARPAL ARTHROPLASTY;  Surgeon: Tami Ribas, MD;  Location: [facility];  Service: Orthopedics;  Laterality: Left;   ICD IMPLANT N/A 04/03/2020   Procedure: ICD IMPLANT;  Surgeon: Regan Lemming, MD;  Location: [facility] INVASIVE CV LAB;  Service: Cardiovascular;  Laterality: N/A;   IR ANGIO INTRA EXTRACRAN SEL COM CAROTID INNOMINATE BILAT MOD SED  01/05/2017   IR ANGIO INTRA EXTRACRAN SEL COM CAROTID INNOMINATE BILAT MOD SED  03/19/2019   IR ANGIO INTRA EXTRACRAN SEL COM CAROTID INNOMINATE BILAT MOD SED  06/04/2020   IR ANGIO INTRA EXTRACRAN SEL INTERNAL CAROTID UNI L MOD SED  04/15/2019   IR ANGIO VERTEBRAL SEL VERTEBRAL BILAT MOD SED  01/05/2017   IR ANGIO VERTEBRAL SEL VERTEBRAL BILAT MOD SED  03/19/2019   IR ANGIO VERTEBRAL SEL VERTEBRAL UNI L MOD SED  06/04/2020   IR ANGIOGRAM FOLLOW UP STUDY  04/15/2019   IR RADIOLOGIST EVAL & MGMT  12/30/2016   IR TRANSCATH/EMBOLIZ  04/15/2019   IR US GUIDE VASC ACCESS RIGHT  03/19/2019   IR US GUIDE VASC ACCESS RIGHT  06/04/2020   MALONEY DILATION N/A 09/20/2018   Procedure: Elease Hashimoto DILATION;  Surgeon: Corbin Ade, MD;  [facility]: [facility];  Service: Endoscopy;  Laterality: N/A;   MALONEY DILATION N/A 01/04/2023   Procedure: Elease Hashimoto DILATION;  Surgeon: Corbin Ade, MD;  Location: [facility];  Service: Endoscopy;  Laterality: N/A;   RADIOLOGY WITH ANESTHESIA N/A 04/15/2019    Procedure: Sharman Crate;  Surgeon: Julieanne Cotton, MD;  Location: [facility];  Service: Radiology;  Laterality: N/A;   RIGHT/LEFT HEART CATH AND CORONARY ANGIOGRAPHY N/A 08/19/2019   Procedure: RIGHT/LEFT HEART CATH AND CORONARY ANGIOGRAPHY;  Surgeon: Laurey Morale, MD;  Location: [facility] INVASIVE CV LAB;  Service: Cardiovascular;  Laterality: N/A;   TUBAL LIGATION  1987   VAGINAL HYSTERECTOMY  2009    Family History  Problem Relation Age of Onset   Stroke Mother    Hypertension Mother    Diabetes Mother    Heart attack Mother    Heart attack Father    Diabetes Father    Hypertension Father    CAD Father    Colon polyps Father 28       pre-cancerous    Stroke Father    Dementia Father    Hyperlipidemia Father    Arthritis Father    COPD Father    Heart disease Father  Breast cancer Maternal Grandmother    Diabetes Maternal Grandmother    Cancer Maternal Grandfather        Tongue and esophageal   Anxiety disorder Daughter    Depression Daughter    Anxiety disorder Daughter    Heart failure Other    Cancer Paternal Grandmother    Colon cancer Neg Hx    Social History:  reports that she has been smoking cigarettes. She has a 3.8 pack-year smoking history. She has never used smokeless tobacco. She reports that she does not currently use alcohol. She reports current drug use. Drug: Marijuana.  Allergies:  Allergies  Allergen Reactions   Alpha-Gal Shortness Of Breath, Nausea And Vomiting and Dermatitis   Other Shortness Of Breath, Diarrhea, Nausea And Vomiting and Nausea Only    All- red meats  Medications in Capsule form    Tape Other (See Comments)    PEELS SKIN OFF  (PAPER TAPE IS FINE)    Medications Prior to Admission  Medication Sig Dispense Refill   aspirin EC 81 MG tablet Take 81 mg by mouth every evening.      atorvastatin (LIPITOR) 80 MG tablet Take 1 tablet (80 mg total) by mouth daily. 90 tablet 3   clopidogrel (PLAVIX) 75 MG tablet Take 1 tablet by mouth  once daily 30 tablet 11   cyproheptadine (PERIACTIN) 4 MG tablet Take 8 mg by mouth 2 (two) times daily.     dapagliflozin propanediol (FARXIGA) 10 MG TABS tablet Take 1 tablet (10 mg total) by mouth daily before breakfast. 30 tablet 11   EPINEPHrine (EPIPEN 2-PAK) 0.3 mg/0.3 mL IJ SOAJ injection Inject 0.3 mg into the muscle as needed for anaphylaxis. 2 each 2   Evolocumab (REPATHA SURECLICK) 140 MG/ML SOAJ Inject 140 mg into the skin every 14 (fourteen) days. 6 mL 3   fenofibrate (TRICOR) 145 MG tablet Take 1 tablet (145 mg total) by mouth daily. 90 tablet 3   furosemide (LASIX) 20 MG tablet Take 1 tablet (20 mg total) by mouth daily. 90 tablet 3   gabapentin (NEURONTIN) 300 MG capsule Take 1 capsule by mouth at bedtime 30 capsule 0   linaclotide (LINZESS) 145 MCG CAPS capsule Take 1 capsule (145 mcg total) by mouth daily before breakfast. 30 capsule 11   losartan (COZAAR) 25 MG tablet Take 1 tablet (25 mg total) by mouth at bedtime. 90 tablet 3   metoprolol succinate (TOPROL XL) 25 MG 24 hr tablet Take 1 tablet (25 mg total) by mouth daily. 90 tablet 3   Multiple Vitamins-Minerals (MULTIVITAMIN WITH MINERALS) tablet Take 1 tablet by mouth daily.     ondansetron (ZOFRAN) 4 MG tablet Take 1 tablet (4 mg total) by mouth every 6 (six) hours as needed for nausea or vomiting. 60 tablet 5   pantoprazole (PROTONIX) 40 MG tablet TAKE 1 TABLET BY MOUTH TWICE DAILY BEFORE A MEAL 60 tablet 3   potassium chloride SA (KLOR-CON M) 20 MEQ tablet Take 1 tablet (20 mEq total) by mouth 2 (two) times daily. 60 tablet 6   rOPINIRole (REQUIP) 0.5 MG tablet Take 0.5 mg by mouth at bedtime.   0   scopolamine (TRANSDERM-SCOP) 1 MG/3DAYS Place 1 patch (1.5 mg total) onto the skin every 3 (three) days. 10 patch 5   spironolactone (ALDACTONE) 25 MG tablet Take 1 tablet (25 mg total) by mouth at bedtime. 90 tablet 3   sucralfate (CARAFATE) 1 GM/10ML suspension Take 10 mLs (1 g total) by mouth 4 (four)  times daily. 420 mL 1    varenicline (CHANTIX) 1 MG tablet Take 1 tablet (1 mg total) by mouth 2 (two) times daily. 60 tablet 3   Vitamin D, Ergocalciferol, (DRISDOL) 1.25 MG (50000 UNIT) CAPS capsule Take 50,000 Units by mouth every 7 (seven) days.     loperamide (IMODIUM A-D) 2 MG tablet Take 2 mg by mouth 4 (four) times daily as needed for diarrhea or loose stools.     nitroGLYCERIN (NITROSTAT) 0.4 MG SL tablet Place 1 tablet (0.4 mg total) under the tongue every 5 (five) minutes x 3 doses as needed for chest pain (if no relief after 2nd dose, proceed to the ED for an evaluation or call 911). 25 tablet 2   traMADol (ULTRAM) 50 MG tablet Take 1 tablet (50 mg total) by mouth 3 (three) times daily as needed. 30 tablet 2   [name] 1 g capsule Take 2 capsules (2 g total) by mouth 2 (two) times daily. 120 capsule 11    No results found for this or any previous visit (from the past 48 hour(s)). No results found.  Review of Systems  Musculoskeletal:  Positive for arthralgias.  All other systems reviewed and are negative.   Blood pressure (!) 95/57, pulse 76, temperature 98.5 F (36.9 C), temperature source Oral, resp. rate 18, height 5' (1.524 m), weight 49.9 kg, SpO2 98%. Physical Exam Vitals reviewed.  HENT:     Head: Normocephalic.     Nose: Nose normal.     Mouth/Throat:     Mouth: Mucous membranes are moist.  Eyes:     Pupils: Pupils are equal, round, and reactive to light.  Cardiovascular:     Rate and Rhythm: Normal rate.     Pulses: Normal pulses.  Pulmonary:     Effort: Pulmonary effort is normal.  Abdominal:     General: Abdomen is flat.  Musculoskeletal:     Cervical back: Normal range of motion.  Skin:    General: Skin is warm.     Capillary Refill: Capillary refill takes less than 2 seconds.  Neurological:     General: No focal deficit present.     Mental Status: She is alert.  Psychiatric:        Mood and Affect: Mood normal.     Ortho exam demonstrates good cervical spine range of  motion. Passive range of motion on the right is 30/80/120. Passive range of motion on the left is 40/80/120. Coarse grinding and crepitus is present with passive range of motion more on the left compared to the right. There is sensory function to the hand is intact. Radial pulses intact. No pitting edema bilateral lower extremities.  Skin intact in the left shoulder region.  Pacemaker in place.  Incision for the pacemaker is approximately 3 to 4 cm away from the anticipated incision for the deltopectoral approach.  Deltoid is functional. Assessment/Plan  Impression is severe bilateral shoulder arthritis left worse than right.  MRI scan does show severe left-sided glenohumeral joint arthritis with rotator cuff tendinosis and biceps tendon tendinosis.  Plan at this time is we had a long discussion about reverse shoulder replacement.  With the use of models the rationale of reverse shoulder replacement is discussed along with patient specific instrumentation.  The risk and benefits were also discussed including but not limited to infection nerve and vessel damage incomplete pain relief as well as incomplete restoration of function.  Extensive nature of the rehabilitative process is also discussed.  She  would need to undergo cardiac risk stratification in order to optimize her perioperative medical condition to diminish chances of complication.  Patient understands the risk and benefits and wishes to proceed.  All questions answered.   Burnard Bunting, MD 03/21/2023, 10:54 AM

## 2023-03-21 NOTE — Plan of Care (Signed)
  Problem: Education: Goal: Knowledge of General Education information will improve Description: Including pain rating scale, medication(s)/side effects and non-pharmacologic comfort measures Outcome: Progressing   Problem: Health Behavior/Discharge Planning: Goal: Ability to manage health-related needs will improve Outcome: Progressing   Problem: Clinical Measurements: Goal: Will remain free from infection Outcome: Progressing   Problem: Clinical Measurements: Goal: Respiratory complications will improve Outcome: Progressing   Problem: Nutrition: Goal: Adequate nutrition will be maintained Outcome: Progressing   Problem: Coping: Goal: Level of anxiety will decrease Outcome: Progressing   Problem: Elimination: Goal: Will not experience complications related to bowel motility Outcome: Progressing   Problem: Elimination: Goal: Will not experience complications related to urinary retention Outcome: Progressing   Problem: Pain Management: Goal: General experience of comfort will improve Outcome: Progressing   Problem: Safety: Goal: Ability to remain free from injury will improve Outcome: Progressing   Problem: Skin Integrity: Goal: Risk for impaired skin integrity will decrease Outcome: Progressing   Problem: Education: Goal: Knowledge of the prescribed therapeutic regimen will improve Outcome: Progressing   Problem: Pain Management: Goal: Pain level will decrease with appropriate interventions Outcome: Progressing

## 2023-03-21 NOTE — Transfer of Care (Signed)
Immediate Anesthesia Transfer of Care Note  Patient: [name]  Procedure(s) Performed: LEFT REVERSE SHOULDER ARTHROPLASTY (Left: Shoulder) BICEPS TENODESIS (Left: Shoulder)  Patient Location: PACU  Anesthesia Type:General  Level of Consciousness: awake, alert , and oriented  Airway & Oxygen Therapy: Patient Spontanous Breathing and Patient connected to nasal cannula oxygen  Post-op Assessment: Report given to RN and Post -op Vital signs reviewed and stable  Post vital signs: Reviewed and stable  Last Vitals:  Vitals Value Taken Time  BP 145/86 03/21/23 1503  Temp    Pulse 93 03/21/23 1505  Resp 18 03/21/23 1507  SpO2 96 % 03/21/23 1505  Vitals shown include unfiled device data.  Last Pain:  Vitals:   03/21/23 1110  TempSrc:   PainSc: 0-No pain         Complications: No notable events documented.

## 2023-03-21 NOTE — Anesthesia Procedure Notes (Signed)
Procedure Name: Intubation Date/Time: 03/21/2023 11:43 AM  Performed by: Shary Decamp, CRNAPre-anesthesia Checklist: Patient identified, Patient being monitored, Timeout performed, Emergency Drugs available and Suction available Patient Re-evaluated:Patient Re-evaluated prior to induction Oxygen Delivery Method: Circle System Utilized Preoxygenation: Pre-oxygenation with 100% oxygen Induction Type: IV induction Ventilation: Mask ventilation without difficulty Laryngoscope Size: Miller and 2 Grade View: Grade I Tube type: Oral Tube size: 7.0 mm Number of attempts: 1 Airway Equipment and Method: Stylet Placement Confirmation: ETT inserted through vocal cords under direct vision, positive ETCO2 and breath sounds checked- equal and bilateral Secured at: 21 cm Tube secured with: Tape Dental Injury: Teeth and Oropharynx as per pre-operative assessment

## 2023-03-21 NOTE — Anesthesia Postprocedure Evaluation (Signed)
Anesthesia Post Note  Patient: [place] J Brocious  Procedure(s) Performed: LEFT REVERSE SHOULDER ARTHROPLASTY (Left: Shoulder) BICEPS TENODESIS (Left: Shoulder)     Patient location during evaluation: PACU Anesthesia Type: Regional and General Level of consciousness: awake and alert Pain management: pain level controlled Vital Signs Assessment: post-procedure vital signs reviewed and stable Respiratory status: spontaneous breathing, nonlabored ventilation, respiratory function stable and patient connected to nasal cannula oxygen Cardiovascular status: blood pressure returned to baseline and stable Postop Assessment: no apparent nausea or vomiting Anesthetic complications: no   No notable events documented.  Last Vitals:  Vitals:   03/21/23 1615 03/21/23 1631  BP: 135/72 124/72  Pulse: 84 84  Resp: 18   Temp: 36.8 C 36.7 C  SpO2: 94% 98%    Last Pain:  Vitals:   03/21/23 1631  TempSrc: Oral  PainSc:    Pain Goal:                   Trevor Iha

## 2023-03-21 NOTE — Op Note (Signed)
NAME: Erin [name], Erin [name] MEDICAL RECORD NO: [id number] ACCOUNT NO: [id number] DATE OF BIRTH: 18-Nov-1960 FACILITY: [facility] LOCATION: [facility] PHYSICIAN: Graylin Shiver. August Saucer, MD  Operative Report   DATE OF PROCEDURE: 03/21/2023  PREOPERATIVE DIAGNOSES: 1.  Left shoulder arthritis. 2.  Biceps tendinitis.  POSTOPERATIVE DIAGNOSES: 1.  Left shoulder arthritis. 2.  Biceps tendinitis.  PROCEDURE:  Left reverse shoulder replacement and biceps tenodesis.  Components utilized include glenosphere 36 standard with 1.5 mm posterior offset, small augmented baseplate with one central compression screw and four peripheral locking screws with  mini humeral stem size 8 x 83 and mini humeral tray +6 taper offset 40 mm in diameter with 36 standard bearing.  SURGEON:  Graylin Shiver. August Saucer, MD.  ASSISTANT:  Karenann Cai, PA.  INDICATIONS:  This is a 62 year old patient with left shoulder pain and arthritis who presents for operative management after explanation of risks and benefits.  DESCRIPTION OF PROCEDURE:  The patient was brought to the operating room where a general anesthetic was induced.  Preoperative antibiotic administered, timeout was called.  The patient was placed in the beach chair position with the head in neutral  position.  Left shoulder, arm, and hand prescrubbed with hydrogen peroxide followed by alcohol and Betadine which was allowed to air dry and then prepped with ChloraPrep solution and draped in a sterile manner.  Ioban used to seal the operative field.   After following timeout, deltopectoral approach was made.  We stayed about 3 to 4 cm away from the incision for the pacemaker.  Cephalic vein mobilized laterally, but avulsion of a branch required ligation of the cephalic vein.  About the upper 1.5 cm of  the pectoralis was released.  Biceps tendon was tenodesed to the pec and do surrounding tissue using 5-0 Vicryl sutures.  Axillary nerve palpated.  Subdeltoid and subacromial adhesions released  manually.  Axillary nerve palpated and protected at all  times during the case.  Next, Cobell retractor was placed.  The rotator cuff interval was opened up to the base of the coracoid.  The circumflex vessels were ligated.  The subscapularis was then peeled off the lesser tuberosity using a #15 blade.  Tagged  with 0 Vicryl sutures.  Next, the capsule was released around to the 7 o'clock position on the humeral head.  Humeral head was dislocated.  Supraspinatus and infraspinatus intact, but severe arthritis was present.  Next, the head was dislocated.   Proximal reaming was performed up to a size 8.  Head was cut in 30 degrees of retroversion.  Back to the rotator cuff attachment margin.  Broached up to a size 8 and a cap was placed.  Next, posterior and anterior retractors were placed.  The biceps  tendon and labrum were released.  Subscapularis release also performed.  Using the patient's specific instrumentation, the glenoid was reamed to the approximate depth of 3 to 4 mm inferiorly.  Small augment reaming was then performed.  A trial was placed  and we obtained 100% baseplate contact compatible with the patient's preoperative plan.  The glenoid baseplate was then placed with one central compression screw and four peripheral locking screws.  Next, we did trial reduction with the standard head  with the offset sent posteriorly about 1.5 mm due to the relatively small size to avoid coracoid impingement.  Next, a +6 offset humeral tray with standard bearing was placed and the patient had very good stability.  Excellent stability to extension and  adduction with the forward force along  with internal and external rotation at 90 degrees of abduction.  Two fingers required for reduction.  Reduction was difficult.  Relocation also difficult.  At this time, trial components were removed.  The  glenosphere was placed with 1.5 mm offset posteriorly.  The stent was placed after placing six suture tapes through the  lesser tuberosity.  The canal was irrigated with IrriSept solution which was used after the incision as well as after the arthrotomy  at multiple times during the case.  The stent was placed and the same stability was achieved with the +6 tray and the standard liner.  This was then placed as well.  Thorough irrigation was performed.  Same stability parameters maintained.  Axillary  nerve intact.  The subscapularis was then reattached with the arm in 30 degrees of external rotation to the lesser tuberosity using NICE knots and the six suture tapes.  Rotator interval closed using #1 Vicryl suture after irrigating the implant with  IrriSept solution and placing vancomycin powder on the implant.  Next, the axillary nerve was palpated and intact.  The deltopectoral interval was closed using #1 Vicryl suture followed by interrupted inverted 0 Vicryl suture, 2-0 Vicryl suture, and 3-0  Monocryl with Steri-Strips.  Aquacel dressing applied.  Shoulder immobilizer placed.  The patient tolerated the procedure well without immediate complications.  Luke's assistance was required at all times for retraction, opening, closing, mobilization of tissue.  His assistance was of medical necessity.   PUS D: 03/21/2023 3:17:04 pm T: 03/21/2023 6:25:00 pm  JOB: [id number]/ [id number]

## 2023-03-21 NOTE — Anesthesia Procedure Notes (Signed)
Anesthesia Regional Block: Interscalene brachial plexus block   Pre-Anesthetic Checklist: , timeout performed,  Correct Patient, Correct Site, Correct Laterality,  Correct Procedure, Correct Position, site marked,  Risks and benefits discussed,  Surgical consent,  Pre-op evaluation,  At surgeon's request and post-op pain management  Laterality: Upper and Left  Prep: Maximum Sterile Barrier Precautions used, chloraprep       Needles:  Injection technique: Single-shot  Needle Type: Echogenic Needle     Needle Length: 5cm  Needle Gauge: 21     Additional Needles:   Procedures:,,,, ultrasound used (permanent image in chart),,    Narrative:  Start time: 03/21/2023 10:42 AM End time: 03/21/2023 10:47 AM Injection made incrementally with aspirations every 5 mL.  Performed by: Personally  Anesthesiologist: Trevor Iha, MD  Additional Notes: Block assessed prior to procedure. Patient tolerated procedure well.

## 2023-03-21 NOTE — Brief Op Note (Signed)
   03/21/2023  3:09 PM  PATIENT:  Erin [name]  62 y.o. female  PRE-OPERATIVE DIAGNOSIS:  left shoulder osteoarthritis, biceps tendinitis  POST-OPERATIVE DIAGNOSIS:  left shoulder osteoarthritis, biceps tendinitis  PROCEDURE:  Procedure(s): LEFT REVERSE SHOULDER ARTHROPLASTY BICEPS TENODESIS  SURGEON:  Surgeon(s): August Saucer, Corrie Mckusick, MD  ASSISTANT: magnant pa  ANESTHESIA:   general  EBL: 75 ml    Total I/O In: 100 [IV Piggyback:100] Out: -   BLOOD ADMINISTERED: none  DRAINS: none   LOCAL MEDICATIONS USED:  none  SPECIMEN:  No Specimen  COUNTS:  YES  TOURNIQUET:  * No tourniquets in log *  DICTATION: .Other Dictation: Dictation Number [id number]  PLAN OF CARE: Admit for overnight observation  PATIENT DISPOSITION:  PACU - hemodynamically stable

## 2023-03-21 NOTE — Progress Notes (Signed)
Incentive spirometry introduced with teach back.  SCD place per order.  Patient refused to have ted hose this time.  No signs of bleeding or edema at the procedure site.  Will continue to monitor.

## 2023-03-22 ENCOUNTER — Encounter (HOSPITAL_COMMUNITY): Payer: Self-pay | Admitting: Orthopedic Surgery

## 2023-03-22 ENCOUNTER — Telehealth: Payer: Self-pay | Admitting: Surgical

## 2023-03-22 DIAGNOSIS — Z85828 Personal history of other malignant neoplasm of skin: Secondary | ICD-10-CM | POA: Diagnosis not present

## 2023-03-22 DIAGNOSIS — I2510 Atherosclerotic heart disease of native coronary artery without angina pectoris: Secondary | ICD-10-CM | POA: Diagnosis not present

## 2023-03-22 DIAGNOSIS — I110 Hypertensive heart disease with heart failure: Secondary | ICD-10-CM | POA: Diagnosis not present

## 2023-03-22 DIAGNOSIS — M7522 Bicipital tendinitis, left shoulder: Secondary | ICD-10-CM | POA: Diagnosis not present

## 2023-03-22 DIAGNOSIS — M19012 Primary osteoarthritis, left shoulder: Secondary | ICD-10-CM | POA: Diagnosis not present

## 2023-03-22 DIAGNOSIS — I5020 Unspecified systolic (congestive) heart failure: Secondary | ICD-10-CM | POA: Diagnosis not present

## 2023-03-22 DIAGNOSIS — Z7902 Long term (current) use of antithrombotics/antiplatelets: Secondary | ICD-10-CM | POA: Diagnosis not present

## 2023-03-22 DIAGNOSIS — Z7982 Long term (current) use of aspirin: Secondary | ICD-10-CM | POA: Diagnosis not present

## 2023-03-22 DIAGNOSIS — Z955 Presence of coronary angioplasty implant and graft: Secondary | ICD-10-CM | POA: Diagnosis not present

## 2023-03-22 MED ORDER — ASPIRIN 81 MG PO TBEC: 81.0000 mg | DELAYED_RELEASE_TABLET | Freq: Every day | ORAL | Status: DC

## 2023-03-22 MED ORDER — CLOPIDOGREL BISULFATE 75 MG PO TABS: 75.0000 mg | ORAL_TABLET | Freq: Every day | ORAL | Status: DC

## 2023-03-22 MED ORDER — OXYCODONE HCL 5 MG PO TABS: 5.0000 mg | ORAL_TABLET | Freq: Four times a day (QID) | ORAL | 0 refills | Status: DC | PRN

## 2023-03-22 MED ORDER — SODIUM CHLORIDE 0.9 % IV BOLUS
500.0000 mL | Freq: Once | INTRAVENOUS | Status: AC
  Administered 2023-03-22: 500 mL via INTRAVENOUS

## 2023-03-22 NOTE — Evaluation (Signed)
Physical Therapy Evaluation Patient Details Name: Erin [name] MRN: [id number] DOB: 1960-07-01 Today's Date: 03/22/2023  History of Present Illness  62 y/o female presents to [facility] 03/21/23 s/p L reverse shoulder arthroplasty and biceps tenodesis. PMH includes: anxiety, brain aneurysm status post repair, CHF, HFrEF, coronary disease, anemia, HTN,GERD   Clinical Impression  Pt in bed upon arrival and agreeable to PT eval. Prior to admission, pt was independent w/ no AD for mobility. Limited eval today as pt had low BP readings (see below). Pt with no symptoms of dizziness or lightheadedness with RN aware. Pt was able to stand and march in place with no AD and supervision. Pt presents to therapy session with decreased activity tolerance and mobility. Pt would benefit from acute skilled PT to progress activity tolerance. Acute PT to follow.       BP Readings Supine: 70/53, 61 Sitting EOB: 71/54, 60 Standing: 86/61, 54    If plan is discharge home, recommend the following: A little help with walking and/or transfers   Can travel by private vehicle    Yes    Equipment Recommendations None recommended by PT     Functional Status Assessment Patient has had a recent decline in their functional status and demonstrates the ability to make significant improvements in function in a reasonable and predictable amount of time.     Precautions / Restrictions Precautions Precautions: Fall Restrictions Weight Bearing Restrictions: Yes LUE Weight Bearing: Non weight bearing      Mobility  Bed Mobility Overal bed mobility: Modified Independent Bed Mobility: Supine to Sit, Sit to Supine      General bed mobility comments: ModI w/ HOB elevated    Transfers Overall transfer level: Needs assistance Equipment used: None Transfers: Sit to/from Stand Sit to Stand: Supervision           General transfer comment: supervision for safety    Ambulation/Gait  General Gait Details:  deferred due to low BP, pt able to march in place w/ no AD and supervision      Balance Overall balance assessment: No apparent balance deficits (not formally assessed)           Pertinent Vitals/Pain Pain Assessment Pain Assessment: No/denies pain    Home Living Family/patient expects to be discharged to:: Private residence Living Arrangements: Spouse/significant other;Children Available Help at Discharge: Family;Available 24 hours/day Type of Home: House Home Access: Level entry       Home Layout: Two level;Able to live on main level with bedroom/bathroom;Full bath on main level Home Equipment: Grab bars - tub/shower;Tub bench      Prior Function Prior Level of Function : Independent/Modified Independent;Working/employed;Driving             Mobility Comments: independent w/ no AD ADLs Comments: ind ADLs, driving, light IADLs but having more difficulty with L shoulder reaching/lifting; 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 Extremity Assessment Upper Extremity Assessment: Right hand dominant;LUE deficits/detail    Lower Extremity Assessment Lower Extremity Assessment: Overall WFL for tasks assessed    Cervical / Trunk Assessment Cervical / Trunk Assessment: Normal  Communication   Communication Communication: No apparent difficulties  Cognition Arousal: Alert Behavior During Therapy: WFL for tasks assessed/performed Overall Cognitive Status: Within Functional Limits for tasks assessed       General Comments General comments (skin integrity, edema, etc.): See BP readings, no symptoms of dizziness or lightheadedness. Pt reports low BP is  common for her     PT Assessment Patient needs continued PT services  PT Problem List Decreased activity tolerance;Decreased balance;Decreased mobility       PT Treatment Interventions DME instruction;Gait training;Functional mobility  training;Therapeutic activities;Therapeutic exercise;Balance training;Neuromuscular re-education;Patient/family education    PT Goals (Current goals can be found in the Care Plan section)  Acute Rehab PT Goals Patient Stated Goal: to get better PT Goal Formulation: With patient Time For Goal Achievement: 04/05/23 Potential to Achieve Goals: Good    Frequency Min 1X/week        AM-PAC PT "6 Clicks" Mobility  Outcome Measure Help needed turning from your back to your side while in a flat bed without using bedrails?: None Help needed moving from lying on your back to sitting on the side of a flat bed without using bedrails?: None Help needed moving to and from a bed to a chair (including a wheelchair)?: None Help needed standing up from a chair using your arms (e.g., wheelchair or bedside chair)?: A Little Help needed to walk in hospital room?: A Little Help needed climbing 3-5 steps with a railing? : A Little 6 Click Score: 21    End of Session Equipment Utilized During Treatment: Gait belt Activity Tolerance: Treatment limited secondary to medical complications (Comment) (low BP) Patient left: in bed;with call bell/phone within reach Nurse Communication: Mobility status;Other (comment) (BP readings) PT Visit Diagnosis: Unsteadiness on feet (R26.81)    Time: 4098-1191 PT Time Calculation (min) (ACUTE ONLY): 19 min   Charges:   PT Evaluation $PT Eval Low Complexity: 1 Low   PT General Charges $$ ACUTE PT VISIT: 1 Visit         Hilton Cork, PT, DPT Secure Chat Preferred  Rehab Office [phone]   Arturo Morton Brion Aliment 03/22/2023, 11:03 AM

## 2023-03-22 NOTE — Evaluation (Addendum)
Occupational Therapy Evaluation Patient Details Name: Erin [name] MRN: [id number] DOB: [birth year] Today's Date: 03/22/2023   History of Present Illness 62 y/o female presents to [facility] 03/21/23 s/p L reverse shoulder arthroplasty and biceps tenodesis. PMH includes: anxiety, brain aneurysm status post repair, CHF, HFrEF, coronary disease, anemia, HTN,GERD   Clinical Impression   PTA patient independent, working and driving. Admitted for above and presents with problem list below.  Patient educated on sling mgmt and wear schedule, L UE positioning, exercises and safety.  She requires assist with sling mgmt and ADLs due to decreased functional ROM of dominant R UE, overall needing setup to mod assist for ADLs; mobilizing with supervision. She completed exercises as below, continues to need assist with elbow ROM due to nerve block intact. She report she will have good support from family at home, no questions or concerns at this time.  OT will follow acutely, with plan to defer follow up services when cleared by surgeon.        If plan is discharge home, recommend the following: A lot of help with bathing/dressing/bathroom;Assistance with cooking/housework;Assist for transportation;Help with stairs or ramp for entrance    Functional Status Assessment  Patient has had a recent decline in their functional status and demonstrates the ability to make significant improvements in function in a reasonable and predictable amount of time.  Equipment Recommendations  None recommended by OT    Recommendations for Other Services       Precautions / Restrictions Precautions Precautions: Fall Precaution Comments: No shoulder ROM, but okay pendulums, elbow, wrist and hand AROM Required Braces or Orthoses: Sling Other Brace: at all times, except when bathing/dressing and during exercises Restrictions Weight Bearing Restrictions: Yes LUE Weight Bearing: Non weight bearing      Mobility Bed  Mobility Overal bed mobility: Modified Independent                  Transfers Overall transfer level: Needs assistance Equipment used: None Transfers: Sit to/from Stand Sit to Stand: Supervision           General transfer comment: supervision for safety      Balance Overall balance assessment: No apparent balance deficits (not formally assessed)                                         ADL either performed or assessed with clinical judgement   ADL Overall ADL's : Needs assistance/impaired     Grooming: Minimal assistance;Sitting           Upper Body Dressing : Moderate assistance;Sitting   Lower Body Dressing: Minimal assistance;Sit to/from stand   Toilet Transfer: Supervision/safety;Ambulation           Functional mobility during ADLs: Supervision/safety       Vision   Vision Assessment?: No apparent visual deficits     Perception         Praxis         Pertinent Vitals/Pain Pain Assessment Pain Assessment: Faces Faces Pain Scale: Hurts a little bit Pain Location: L shoulder Pain Descriptors / Indicators: Discomfort, Operative site guarding Pain Intervention(s): Limited activity within patient's tolerance, Monitored during session, Repositioned     Extremity/Trunk Assessment Upper Extremity Assessment Upper Extremity Assessment: Right hand dominant;LUE deficits/detail;RUE deficits/detail RUE Deficits / Details: shoulder flexion limited to 90*, limited IR/ER with hx of chronic deficits at baseline RUE Coordination: decreased  gross motor LUE Deficits / Details: s/p shoulder surgery in sling with nerve block intact LUE Sensation: decreased light touch LUE Coordination: decreased gross motor;decreased fine motor   Lower Extremity Assessment Lower Extremity Assessment: Defer to PT evaluation   Cervical / Trunk Assessment Cervical / Trunk Assessment: Normal   Communication Communication Communication: No apparent  difficulties   Cognition Arousal: Alert Behavior During Therapy: WFL for tasks assessed/performed Overall Cognitive Status: Within Functional Limits for tasks assessed                                       General Comments       Exercises Exercises: Shoulder, Other exercises Shoulder Exercises Elbow Flexion: PROM, Left, 10 reps, Supine Elbow Extension: PROM, Left, 10 reps, Supine Wrist Flexion: AROM, Left, 10 reps, Supine Wrist Extension: AROM, Left, 10 reps, Supine Digit Composite Flexion: AROM, Left, 10 reps, Supine Composite Extension: AROM, Left, 10 reps, Supine Other Exercises Other Exercises: AROM, Left 10 reps supine supination/pronation   Shoulder Instructions Shoulder Instructions Donning/doffing shirt without moving shoulder: Moderate assistance Method for sponge bathing under operated UE: Maximal assistance Donning/doffing sling/immobilizer: Maximal assistance Correct positioning of sling/immobilizer: Minimal assistance ROM for elbow, wrist and digits of operated UE: Minimal assistance Sling wearing schedule (on at all times/off for ADL's): Supervision/safety Proper positioning of operated UE when showering: Supervision/safety Positioning of UE while sleeping: Supervision/safety    Home Living Family/patient expects to be discharged to:: Private residence Living Arrangements: Spouse/significant other;Children Available Help at Discharge: Family;Available 24 hours/day Type of Home: House Home Access: Level entry     Home Layout: Two level;Able to live on main level with bedroom/bathroom;Full bath on main level     Bathroom Shower/Tub: Chief Strategy Officer: Standard Bathroom Accessibility: Yes   Home Equipment: Grab bars - tub/shower;Tub bench          Prior Functioning/Environment Prior Level of Function : Independent/Modified Independent             Mobility Comments: independent w/ no AD ADLs Comments: ind ADLs,  driving, light IADLs but having more difficulty with L shoulder reaching/lifting; 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: Decreased strength;Decreased activity tolerance;Impaired balance (sitting and/or standing);Decreased range of motion;Pain;Impaired UE functional use;Decreased knowledge of precautions;Decreased knowledge of use of DME or AE      OT Treatment/Interventions: Self-care/ADL training;Therapeutic exercise;DME and/or AE instruction;Therapeutic activities;Patient/family education;Balance training    OT Goals(Current goals can be found in the care plan section) Acute Rehab OT Goals Patient Stated Goal: home OT Goal Formulation: With patient Time For Goal Achievement: 04/05/23 Potential to Achieve Goals: Good ADL Goals Pt Will Perform Grooming: with modified independence;standing Pt Will Perform Upper Body Dressing: with min assist;sitting Pt Will Perform Lower Body Dressing: with modified independence;sit to/from stand Pt Will Transfer to Toilet: with modified independence;ambulating Pt Will Perform Toileting - Clothing Manipulation and hygiene: sit to/from stand;sitting/lateral leans;with modified independence  OT Frequency: Min 1X/week    Co-evaluation              AM-PAC OT "6 Clicks" Daily Activity     Outcome Measure Help from another person eating meals?: A Little Help from another person taking care of personal grooming?: A Little Help from another person toileting, which includes using toliet, bedpan, or urinal?: A Little Help from  another person bathing (including washing, rinsing, drying)?: A Lot Help from another person to put on and taking off regular upper body clothing?: A Lot Help from another person to put on and taking off regular lower body clothing?: A Little 6 Click Score: 16   End of Session Equipment Utilized During Treatment: Other (comment) (sling) Nurse Communication: Mobility  status  Activity Tolerance: Patient tolerated treatment well Patient left: in bed;with call bell/phone within reach  OT Visit Diagnosis: Pain Pain - Right/Left: Left Pain - part of body: Shoulder                Time: 1019-1050 OT Time Calculation (min): 31 min Charges:  OT General Charges $OT Visit: 1 Visit OT Evaluation $OT Eval Moderate Complexity: 1 Mod  Barry Brunner, OT Acute Rehabilitation Services Office [phone]   Chancy Milroy 03/22/2023, 1:59 PM

## 2023-03-22 NOTE — Progress Notes (Addendum)
  Subjective: Erin [name] is a 62 y.o. female s/p left RSA.  They are POD 1.  Pt's pain is controlled.  Patient denies any complaints of chest pain, shortness of breath, abdominal pain.  No palpitations.  No events noted on telemetry.  She is having some beeping coming from her pacemaker/AICD.  Has been ambulatory without any difficulty.  She is having some hypotension  Objective: Vital signs in last 24 hours: Temp:  [97.8 F (36.6 C)-98.7 F (37.1 C)] 97.8 F (36.6 C) (11/13 0904) Pulse Rate:  [68-87] 75 (11/13 0904) Resp:  [14-22] 18 (11/13 0904) BP: (76-149)/(53-87) 76/53 (11/13 0904) SpO2:  [94 %-100 %] 99 % (11/13 0904) Weight:  [49.9 kg] 49.9 kg (11/12 0932)  Intake/Output from previous day: 11/12 0701 - 11/13 0700 In: 810 [P.O.:560; I.V.:50; IV Piggyback:200] Out: 750 [Urine:750] Intake/Output this shift: No intake/output data recorded.  Exam:  No gross blood or drainage overlying the dressing 2+ radial pulse of the operative extremity Postoperative physical exam somewhat limited by interscalene block but intact EPL, FPL, finger abduction, grip strength testing, pronation/supination, tricep extension.  Bicep flexion still predictably weak.  Axillary nerve is intact with deltoid firing.   Labs: No results for input(s): "HGB" in the last 72 hours. No results for input(s): "WBC", "RBC", "HCT", "PLT" in the last 72 hours. No results for input(s): "NA", "K", "CL", "CO2", "BUN", "CREATININE", "GLUCOSE", "CALCIUM" in the last 72 hours. No results for input(s): "LABPT", "INR" in the last 72 hours.  Assessment/Plan: Pt is POD 1 s/p left RSA    -Plan to discharge to home today or tomorrow pending patient's pain and pacemaker status.  -Plan to consult cardiology for evaluation of pacemaker  -Saline bolus for hypotension; discussed with RN to hold blood pressure medications  -No lifting with the operative arm  -Follow-up with Dr. August Saucer in clinic 2 weeks  postoperatively     [facility] 03/22/2023, 9:29 AM

## 2023-03-22 NOTE — Progress Notes (Addendum)
Discharge instructions reviewed with pt. Pt verbalized understanding.  Copy of instructions given to pt. Pt informed her script for pain medication was sent to her pharmacy for pick up. Pt had Ice Man machine and ice pack in the room, both sent with her for use at home, shown how to use  both.   Pt had issues with low BP today, see ortho notes, pt was instructed by PA to hold her BP meds and follow up with her PCP. Pt states she will and she may also call her cardiologist and discuss with him, her BP meds were circled on her AVS, and reminded her to contact the doctors tomorrow to discuss. Pt checks her BP at home, she was instructed to monitor 1-2 times a day and record so she can give them to her doctors.   Pt d/c'd via wheelchair with belongings, with grandson waiting at main entrance to pick her up.             Escorted by staff to the car.   Louanna Vanliew,RN SWOT

## 2023-03-22 NOTE — Progress Notes (Signed)
BP 76/53. Pt asymptomatic. Office for Rise Paganini, MD called. See new order for bolus.

## 2023-03-22 NOTE — Telephone Encounter (Signed)
Called and discussed about 45 mins ago

## 2023-03-22 NOTE — Plan of Care (Signed)
  Problem: Education: Goal: Knowledge of General Education information will improve Description: Including pain rating scale, medication(s)/side effects and non-pharmacologic comfort measures Outcome: Progressing   Problem: Health Behavior/Discharge Planning: Goal: Ability to manage health-related needs will improve Outcome: Progressing   Problem: Clinical Measurements: Goal: Ability to maintain clinical measurements within normal limits will improve Outcome: Progressing Goal: Will remain free from infection Outcome: Progressing Goal: Diagnostic test results will improve Outcome: Progressing Goal: Respiratory complications will improve Outcome: Progressing Goal: Cardiovascular complication will be avoided Outcome: Progressing   Problem: Activity: Goal: Risk for activity intolerance will decrease Outcome: Progressing   Problem: Nutrition: Goal: Adequate nutrition will be maintained Outcome: Progressing   Problem: Coping: Goal: Level of anxiety will decrease Outcome: Progressing   Problem: Elimination: Goal: Will not experience complications related to bowel motility Outcome: Progressing Goal: Will not experience complications related to urinary retention Outcome: Progressing   Problem: Pain Management: Goal: General experience of comfort will improve Outcome: Progressing   Problem: Safety: Goal: Ability to remain free from injury will improve Outcome: Progressing   Problem: Skin Integrity: Goal: Risk for impaired skin integrity will decrease Outcome: Progressing   Problem: Education: Goal: Knowledge of the prescribed therapeutic regimen will improve Outcome: Progressing Goal: Understanding of activity limitations/precautions following surgery will improve Outcome: Progressing Goal: Individualized Educational Video(s) Outcome: Progressing   Problem: Activity: Goal: Ability to tolerate increased activity will improve Outcome: Progressing   Problem: Pain  Management: Goal: Pain level will decrease with appropriate interventions Outcome: Progressing

## 2023-03-22 NOTE — Progress Notes (Addendum)
[employer] rep went to check on device.  Appears that the device discharged during surgical procedure despite use of magnet.  She is completely asymptomatic at this time.  No abnormality on telemetry.  No need for formal cardiology evaluation per the card master.  Her blood pressure is last checked at 92/57 and she states that this is in normal range for her after surgery.  With patient at baseline, plan for discharge home but patient was given strict return precautions after discussion over the phone.  She will hold her blood pressure medication until follow-up with her PCP.

## 2023-03-22 NOTE — Telephone Encounter (Signed)
Wilkie Aye (RN) from [facility] called stating pt blood presser is 76/53 and need instruction. Pt has history of heart failure. Please call Kristy back at [phone].

## 2023-03-24 MED ORDER — POTASSIUM CHLORIDE CRYS ER 20 MEQ PO TBCR: EXTENDED_RELEASE_TABLET | ORAL | Status: DC

## 2023-03-24 MED ORDER — FUROSEMIDE 20 MG PO TABS: 40.0000 mg | ORAL_TABLET | Freq: Every day | ORAL | 3 refills | Status: DC

## 2023-03-24 NOTE — Progress Notes (Signed)
Take Lasix 40 mg bid x 1 day, then 40 mg daily after that.  She can add on an addition 20 mEq KCl.  BMET in 1 week and office followup needed.

## 2023-03-24 NOTE — Progress Notes (Signed)
EPIC Encounter for ICM Monitoring  Patient Name: Erin [name] is a 62 y.o. female Date: 03/24/2023 Primary Care Physican: Billie Lade, MD Primary Cardiologist: Shirlee Latch Electrophysiologist: Elberta Fortis 08/11/2021 Office Weight: 121 lbs. 03/25/2022 Office Weight 125-127 lbs 03/13/2023 Weight: 110 lbs 03/24/2023 Weight: 115 lbs                                                          Spoke with patient and heart failure questions reviewed.  Transmission results reviewed.  She reports she is SOB and 5 lbs.  Pt had shoulder surgery (11/5) and discharged from hospital 11/14.  BP was running very low in hospital and was given a lot of fluid to help with low BP during hospitalization.     Optivol thoracic impedance suggesting significant amount of possible fluid accumulation starting 11/8 during hospitalization.   Prescribed: Furosemide 20 mg Take 1 tablets (20 mg total) by mouth daily. Potassium 20 mEq take 1 tablets(20 mEq total) by mouth twice a day Spironolactone 25 mg take 1 tablet daily   Labs: 03/14/2023 Creatinine 1.75, BUN 21, Potassium 3.9, Sodium 140, GFR 33  01/24/2023 Creatinine 1.62, BUN 18, Potassium 3.8, Sodium 137, GFR 36  01/12/2023 Creatinine 1.46, BUN 20, Potassium 4.0, Sodium 137, GFR 41  12/19/2022 Creatinine 1.27, BUN 11, Potassium 4.2, Sodium 142, GFR 48 A complete set of results can be found in Results Review.   Recommendations:  Copy sent to Dr Shirlee Latch for review and recommendations.  Advised to use ER if symptoms get worse.     Follow-up plan: ICM clinic phone appointment on 03/27/2023 to recheck fluid levels.   91 day device clinic remote transmission 05/17/2023.     EP/Cardiology Office Visits:  04/14/2023 with HF Clinic.  Recall 06/04/2023 with Dr Elberta Fortis.     Copy of ICM check sent to Dr. Elberta Fortis.    3 month ICM trend: 03/24/2023.    12-14 Month ICM trend:     Karie Soda, RN 03/24/2023 3:38 PM

## 2023-03-24 NOTE — Progress Notes (Signed)
Spoke with patient and advised Dr Shirlee Latch recommended she take Furosemide 40 mg twice a day x 1 day, then 40 mg daily after that.     Add on an additional 20 mEq Kcl and advised to take 2 tablets in the morning and 1 tablet in the afternoon.      BMET in 1 week and scheduled for 11/22 at HF clinic.  Advised HF clinic may call her for an earlier appointment thank 12/6.    She verbalized understanding of instructions and repeated back correctly.  She requested a Lasix refill and epic med list updated and sent to [facility] as requested.  She does not need Potassium refill at this time but updated in EPIC.

## 2023-03-24 NOTE — Progress Notes (Signed)
Dr Shirlee Latch is out of the office. Sent to HF clinic triage to review and follow up.

## 2023-03-26 ENCOUNTER — Encounter (HOSPITAL_COMMUNITY): Payer: Self-pay | Admitting: Emergency Medicine

## 2023-03-26 ENCOUNTER — Other Ambulatory Visit: Payer: Self-pay

## 2023-03-26 ENCOUNTER — Emergency Department (HOSPITAL_COMMUNITY): Payer: Medicare PPO

## 2023-03-26 ENCOUNTER — Emergency Department (HOSPITAL_COMMUNITY)
Admission: EM | Admit: 2023-03-26 | Discharge: 2023-03-27 | Disposition: A | Discharge status: Home / Self Care | Payer: Medicare PPO | Attending: Emergency Medicine | Admitting: Emergency Medicine

## 2023-03-26 DIAGNOSIS — Z7902 Long term (current) use of antithrombotics/antiplatelets: Secondary | ICD-10-CM | POA: Diagnosis not present

## 2023-03-26 DIAGNOSIS — I959 Hypotension, unspecified: Secondary | ICD-10-CM | POA: Diagnosis not present

## 2023-03-26 DIAGNOSIS — E860 Dehydration: Secondary | ICD-10-CM | POA: Insufficient documentation

## 2023-03-26 DIAGNOSIS — Z96612 Presence of left artificial shoulder joint: Secondary | ICD-10-CM | POA: Diagnosis not present

## 2023-03-26 DIAGNOSIS — R110 Nausea: Secondary | ICD-10-CM | POA: Diagnosis not present

## 2023-03-26 DIAGNOSIS — Z7982 Long term (current) use of aspirin: Secondary | ICD-10-CM | POA: Insufficient documentation

## 2023-03-26 DIAGNOSIS — R0602 Shortness of breath: Secondary | ICD-10-CM | POA: Diagnosis not present

## 2023-03-26 DIAGNOSIS — R1013 Epigastric pain: Secondary | ICD-10-CM | POA: Diagnosis not present

## 2023-03-26 DIAGNOSIS — R531 Weakness: Secondary | ICD-10-CM | POA: Diagnosis not present

## 2023-03-26 LAB — BASIC METABOLIC PANEL
Anion gap: 9 (ref 5–15)
BUN: 22 mg/dL — ABNORMAL HIGH (ref 6–20)
CO2: 23 mmol/L (ref 22–32)
Calcium: 8.9 mg/dL (ref 8.9–10.3)
Chloride: 104 mmol/L (ref 98–111)
Creatinine, Ser: 1.66 mg/dL — ABNORMAL HIGH (ref 0.44–1.00)
GFR, Estimated: 35 mL/min — ABNORMAL LOW (ref >60)
Glucose, Bld: 123 mg/dL — ABNORMAL HIGH (ref 70–99)
Potassium: 4.1 mmol/L (ref 3.5–5.1)
Sodium: 136 mmol/L (ref 135–145)

## 2023-03-26 LAB — CBC WITH DIFFERENTIAL/PLATELET
Abs Immature Granulocytes: 0.13 10*3/uL — ABNORMAL HIGH (ref 0.00–0.07)
Basophils Absolute: 0.1 10*3/uL (ref 0.0–0.1)
Basophils Relative: 1 %
Eosinophils Absolute: 0.1 10*3/uL (ref 0.0–0.5)
Eosinophils Relative: 1 %
HCT: 27.2 % — ABNORMAL LOW (ref 36.0–46.0)
Hemoglobin: 9 g/dL — ABNORMAL LOW (ref 12.0–15.0)
Immature Granulocytes: 1 %
Lymphocytes Relative: 20 %
Lymphs Abs: 2 10*3/uL (ref 0.7–4.0)
MCH: 27.3 pg (ref 26.0–34.0)
MCHC: 33.1 g/dL (ref 30.0–36.0)
MCV: 82.4 fL (ref 80.0–100.0)
Monocytes Absolute: 0.8 10*3/uL (ref 0.1–1.0)
Monocytes Relative: 8 %
Neutro Abs: 6.9 10*3/uL (ref 1.7–7.7)
Neutrophils Relative %: 69 %
Platelets: 511 10*3/uL — ABNORMAL HIGH (ref 150–400)
RBC: 3.3 MIL/uL — ABNORMAL LOW (ref 3.87–5.11)
RDW: 15.9 % — ABNORMAL HIGH (ref 11.5–15.5)
WBC: 9.9 10*3/uL (ref 4.0–10.5)
nRBC: 0 % (ref 0.0–0.2)

## 2023-03-26 MED ORDER — SODIUM CHLORIDE 0.9 % IV BOLUS
250.0000 mL | Freq: Once | INTRAVENOUS | Status: AC
  Administered 2023-03-26: 250 mL via INTRAVENOUS

## 2023-03-26 NOTE — ED Triage Notes (Addendum)
Pt bib EMS for c/o hypotension. Per EMS, pt had shoulder surgery on TUesday and sent home with Oxycodone for pain management. States she has been running low BPs since then. Reports home BPs of 85 systolic. EMS states last BP was 94/65. EMS also states pt c/o nausea and SOB. EMS reported that they administered 4mg  Zofran IV en route for nausea but did not administer any IVF as pt reported to them that they "put her into fluid overload after surgery requiring her to up her Lasix dosage".

## 2023-03-27 ENCOUNTER — Telehealth (HOSPITAL_COMMUNITY): Payer: Self-pay | Admitting: Cardiology

## 2023-03-27 ENCOUNTER — Encounter (HOSPITAL_COMMUNITY): Payer: Self-pay | Admitting: Cardiology

## 2023-03-27 MED ORDER — OXYCODONE-ACETAMINOPHEN 5-325 MG PO TABS
1.0000 | ORAL_TABLET | Freq: Once | ORAL | Status: AC
  Administered 2023-03-27: 1 via ORAL
  Filled 2023-03-27: qty 1

## 2023-03-27 MED ORDER — SODIUM CHLORIDE 0.9 % IV BOLUS
250.0000 mL | Freq: Once | INTRAVENOUS | Status: AC
  Administered 2023-03-27: 250 mL via INTRAVENOUS

## 2023-03-27 NOTE — Progress Notes (Signed)
ICM remote transmission rescheduled for 12/2.  Device transmission fluid levels with be rechecked by HF clinic 11/19 and recommendations will be given at that time.

## 2023-03-27 NOTE — ED Notes (Signed)
Pt ambulated in hallway with staff, BP increased to to 120 systolic after ambulating

## 2023-03-27 NOTE — Telephone Encounter (Signed)
Returned call Pt concerned with low b/p readings and meds Requests er f/u  Appt made with Dr Shirlee Latch 11/19 840

## 2023-03-27 NOTE — Telephone Encounter (Addendum)
Pt called with concerns of hypotension Recent er visit Would like ER fu Appt made 11/19 840

## 2023-03-27 NOTE — ED Provider Notes (Signed)
[place] EMERGENCY DEPARTMENT AT [facility] Provider Note   CSN: [id number] Arrival date & time: 03/26/23  2158     History  Chief Complaint  Patient presents with   Hypotension   Nausea   Shortness of Breath    Erin [name] is a 62 y.o. female.  Patient presents to the emergency department for evaluation of hypotension.  Patient had shoulder surgery earlier this week.  She has been using oxycodone for pain management.  Patient reports that her blood pressures have been running low since the surgery.  She reports that she was given fluids during the surgery and developed some volume overload so was diuresed immediately after the surgery.       Home Medications Prior to Admission medications   Medication Sig Start Date End Date Taking? Authorizing Provider  aspirin EC 81 MG tablet Take 81 mg by mouth every evening.     [provider]  atorvastatin (LIPITOR) 80 MG tablet Take 1 tablet (80 mg total) by mouth daily. 09/05/22   Laurey Morale, MD  clopidogrel (PLAVIX) 75 MG tablet Take 1 tablet by mouth once daily 07/12/22   Laurey Morale, MD  cyproheptadine (PERIACTIN) 4 MG tablet Take 8 mg by mouth 2 (two) times daily. 03/08/23   [provider]  dapagliflozin propanediol (FARXIGA) 10 MG TABS tablet Take 1 tablet (10 mg total) by mouth daily before breakfast. 09/12/22   Milford, Anderson Malta, FNP  EPINEPHrine (EPIPEN 2-PAK) 0.3 mg/0.3 mL IJ SOAJ injection Inject 0.3 mg into the muscle as needed for anaphylaxis. 04/23/21   Alfonse Spruce, MD  Evolocumab (REPATHA SURECLICK) 140 MG/ML SOAJ Inject 140 mg into the skin every 14 (fourteen) days. 09/07/22   Laurey Morale, MD  fenofibrate (TRICOR) 145 MG tablet Take 1 tablet (145 mg total) by mouth daily. 07/27/22   Laurey Morale, MD  furosemide (LASIX) 20 MG tablet Take 2 tablets (40 mg total) by mouth daily. 03/24/23   Laurey Morale, MD  gabapentin (NEURONTIN) 300 MG capsule Take 1 capsule by  mouth at bedtime 03/08/23   Billie Lade, MD  linaclotide [facility]) 145 MCG CAPS capsule Take 1 capsule (145 mcg total) by mouth daily before breakfast. 11/22/22   Rourk, Gerrit Friends, MD  loperamide (IMODIUM A-D) 2 MG tablet Take 2 mg by mouth 4 (four) times daily as needed for diarrhea or loose stools.    [provider]  losartan (COZAAR) 25 MG tablet Take 1 tablet (25 mg total) by mouth at bedtime. 09/12/22   Milford, Anderson Malta, FNP  metoprolol succinate (TOPROL XL) 25 MG 24 hr tablet Take 1 tablet (25 mg total) by mouth daily. 12/19/22   Laurey Morale, MD  Multiple Vitamins-Minerals (MULTIVITAMIN WITH MINERALS) tablet Take 1 tablet by mouth daily.    [provider]  nitroGLYCERIN (NITROSTAT) 0.4 MG SL tablet Place 1 tablet (0.4 mg total) under the tongue every 5 (five) minutes x 3 doses as needed for chest pain (if no relief after 2nd dose, proceed to the ED for an evaluation or call 911). 09/19/22   Laurey Morale, MD  ondansetron (ZOFRAN) 4 MG tablet Take 1 tablet (4 mg total) by mouth every 6 (six) hours as needed for nausea or vomiting. 11/23/22   Rourk, Gerrit Friends, MD  oxyCODONE (OXY IR/ROXICODONE) 5 MG immediate release tablet Take 1 tablet (5 mg total) by mouth every 6 (six) hours as needed for moderate pain (pain score  4-6) (pain score 4-6). 03/22/23   Magnant, Charles L, PA-C  pantoprazole (PROTONIX) 40 MG tablet TAKE 1 TABLET BY MOUTH TWICE DAILY BEFORE A MEAL 10/17/22   Gelene Mink, NP  potassium chloride SA (KLOR-CON M) 20 MEQ tablet Take 2 tablets (40 mEq total) by mouth every morning AND 1 tablet (20 mEq total) every evening. 03/24/23   Laurey Morale, MD  rOPINIRole (REQUIP) 0.5 MG tablet Take 0.5 mg by mouth at bedtime.  11/13/17   [provider]  scopolamine (TRANSDERM-SCOP) 1 MG/3DAYS Place 1 patch (1.5 mg total) onto the skin every 3 (three) days. 01/12/23   Judithann Sheen, PA  spironolactone (ALDACTONE) 25 MG tablet Take 1 tablet (25 mg total) by mouth  at bedtime. 03/07/22   Laurey Morale, MD  sucralfate (CARAFATE) 1 GM/10ML suspension Take 10 mLs (1 g total) by mouth 4 (four) times daily. 04/27/22 04/27/23  Lanelle Bal, DO  varenicline (CHANTIX) 1 MG tablet Take 1 tablet (1 mg total) by mouth 2 (two) times daily. 09/12/22   Milford, Anderson Malta, FNP  [name] 1 g capsule Take 2 capsules (2 g total) by mouth 2 (two) times daily. 08/18/22   Laurey Morale, MD  Vitamin D, Ergocalciferol, (DRISDOL) 1.25 MG (50000 UNIT) CAPS capsule Take 50,000 Units by mouth every 7 (seven) days.    [provider]      Allergies    Alpha-gal, Other, and Tape    Review of Systems   Review of Systems  Physical Exam Updated Vital Signs BP 120/79   Pulse 92   Temp 98.6 F (37 C) (Oral)   Resp 19   Ht 5' (1.524 m)   Wt 50 kg   SpO2 100%   BMI 21.53 kg/m  Physical Exam Vitals and nursing note reviewed.  Constitutional:      General: She is not in acute distress.    Appearance: She is well-developed.  HENT:     Head: Normocephalic and atraumatic.     Mouth/Throat:     Mouth: Mucous membranes are moist.  Eyes:     General: Vision grossly intact. Gaze aligned appropriately.     Extraocular Movements: Extraocular movements intact.     Conjunctiva/sclera: Conjunctivae normal.  Cardiovascular:     Rate and Rhythm: Normal rate and regular rhythm.     Pulses: Normal pulses.     Heart sounds: Normal heart sounds, S1 normal and S2 normal. No murmur heard.    No friction rub. No gallop.  Pulmonary:     Effort: Pulmonary effort is normal. No respiratory distress.     Breath sounds: Normal breath sounds.  Abdominal:     General: Bowel sounds are normal.     Palpations: Abdomen is soft.     Tenderness: There is no abdominal tenderness. There is no guarding or rebound.     Hernia: No hernia is present.  Musculoskeletal:        General: No swelling.     Cervical back: Full passive range of motion without pain, normal range of motion and  neck supple. No spinous process tenderness or muscular tenderness. Normal range of motion.     Right lower leg: No edema.     Left lower leg: No edema.  Skin:    General: Skin is warm and dry.     Capillary Refill: Capillary refill takes less than 2 seconds.     Findings: No ecchymosis, erythema, rash or wound.  Neurological:  General: No focal deficit present.     Mental Status: She is alert and oriented to person, place, and time.     GCS: GCS eye subscore is 4. GCS verbal subscore is 5. GCS motor subscore is 6.     Cranial Nerves: Cranial nerves 2-12 are intact.     Sensory: Sensation is intact.     Motor: Motor function is intact.     Coordination: Coordination is intact.  Psychiatric:        Attention and Perception: Attention normal.        Mood and Affect: Mood normal.        Speech: Speech normal.        Behavior: Behavior normal.     ED Results / Procedures / Treatments   Labs (all labs ordered are listed, but only abnormal results are displayed) Labs Reviewed  CBC WITH DIFFERENTIAL/PLATELET - Abnormal; Notable for the following components:      Result Value   RBC 3.30 (*)    Hemoglobin 9.0 (*)    HCT 27.2 (*)    RDW 15.9 (*)    Platelets 511 (*)    Abs Immature Granulocytes 0.13 (*)    All other components within normal limits  BASIC METABOLIC PANEL - Abnormal; Notable for the following components:   Glucose, Bld 123 (*)    BUN 22 (*)    Creatinine, Ser 1.66 (*)    GFR, Estimated 35 (*)    All other components within normal limits    EKG None  Radiology DG Chest Portable 1 View  Result Date: 03/26/2023 CLINICAL DATA:  Shortness of breath EXAM: PORTABLE CHEST 1 VIEW COMPARISON:  01/12/2023 FINDINGS: Cardiac shadow is stable. Defibrillator is again seen and stable. Left shoulder replacement is now seen in satisfactory position. The lungs are clear bilaterally. No focal infiltrate or effusion is noted. No acute bony abnormality is seen. IMPRESSION: No  acute abnormality noted. Electronically Signed   By: Alcide Clever M.D.   On: 03/26/2023 22:54    Procedures Procedures    Medications Ordered in ED Medications  oxyCODONE-acetaminophen (PERCOCET/ROXICET) 5-325 MG per tablet 1 tablet (has no administration in time range)  sodium chloride 0.9 % bolus 250 mL (0 mLs Intravenous Stopped 03/27/23 0032)  sodium chloride 0.9 % bolus 250 mL (0 mLs Intravenous Stopped 03/27/23 0139)    ED Course/ Medical Decision Making/ A&P                                 Medical Decision Making Amount and/or Complexity of Data Reviewed Labs: ordered. Radiology: ordered.   Presents with concern over low blood pressures.  This is likely multifactorial.  Patient was aggressively diuresed after surgery.  Additionally she is on narcotic analgesia.  Blood pressures are low while she is sleeping but normalized otherwise.  She was given a small fluid bolus.  After this fluid bolus she was ambulated and her blood pressure was 120 systolic while ambulating.  No further workup necessary.        Final Clinical Impression(s) / ED Diagnoses Final diagnoses:  Dehydration    Rx / DC Orders ED Discharge Orders     None         Erin [name], Canary Brim, MD 03/27/23 0151

## 2023-03-28 ENCOUNTER — Ambulatory Visit (HOSPITAL_COMMUNITY)
Admission: RE | Admit: 2023-03-28 | Discharge: 2023-03-28 | Disposition: A | Discharge status: Home / Self Care | Payer: Medicare PPO | Source: Ambulatory Visit | Attending: Cardiology | Admitting: Cardiology

## 2023-03-28 ENCOUNTER — Encounter (HOSPITAL_COMMUNITY): Payer: Self-pay | Admitting: Cardiology

## 2023-03-28 VITALS — BP 88/50 | HR 110 | Wt 109.8 lb

## 2023-03-28 DIAGNOSIS — I5042 Chronic combined systolic (congestive) and diastolic (congestive) heart failure: Secondary | ICD-10-CM | POA: Insufficient documentation

## 2023-03-28 DIAGNOSIS — I252 Old myocardial infarction: Secondary | ICD-10-CM | POA: Insufficient documentation

## 2023-03-28 DIAGNOSIS — R110 Nausea: Secondary | ICD-10-CM | POA: Insufficient documentation

## 2023-03-28 DIAGNOSIS — I255 Ischemic cardiomyopathy: Secondary | ICD-10-CM | POA: Insufficient documentation

## 2023-03-28 DIAGNOSIS — I2510 Atherosclerotic heart disease of native coronary artery without angina pectoris: Secondary | ICD-10-CM | POA: Diagnosis not present

## 2023-03-28 DIAGNOSIS — Z955 Presence of coronary angioplasty implant and graft: Secondary | ICD-10-CM | POA: Diagnosis not present

## 2023-03-28 DIAGNOSIS — Z7982 Long term (current) use of aspirin: Secondary | ICD-10-CM | POA: Diagnosis not present

## 2023-03-28 DIAGNOSIS — Z79899 Other long term (current) drug therapy: Secondary | ICD-10-CM | POA: Diagnosis not present

## 2023-03-28 DIAGNOSIS — Z7902 Long term (current) use of antithrombotics/antiplatelets: Secondary | ICD-10-CM | POA: Insufficient documentation

## 2023-03-28 DIAGNOSIS — F17210 Nicotine dependence, cigarettes, uncomplicated: Secondary | ICD-10-CM | POA: Insufficient documentation

## 2023-03-28 DIAGNOSIS — I6529 Occlusion and stenosis of unspecified carotid artery: Secondary | ICD-10-CM | POA: Diagnosis not present

## 2023-03-28 DIAGNOSIS — I5022 Chronic systolic (congestive) heart failure: Secondary | ICD-10-CM

## 2023-03-28 MED ORDER — LOSARTAN POTASSIUM 25 MG PO TABS: 12.5000 mg | ORAL_TABLET | Freq: Every day | ORAL | Status: DC

## 2023-03-28 MED ORDER — SPIRONOLACTONE 25 MG PO TABS: 12.5000 mg | ORAL_TABLET | Freq: Every evening | ORAL | Status: DC

## 2023-03-28 MED ORDER — FUROSEMIDE 20 MG PO TABS: ORAL_TABLET | ORAL | 3 refills | Status: DC

## 2023-03-28 NOTE — Patient Instructions (Signed)
DECREASE Losartan to 12.5 mg nightly  DECREASE Spironolactone to 12.5 mg nightly.  CHANGE Lasix to 40 mg in the morning and 20 mg in the evening  Your physician recommends that you schedule a follow-up appointment in: 3 weeks.  If you have any questions or concerns before your next appointment please send [place] a message through [place] or call our office at [phone].    TO LEAVE A MESSAGE FOR THE NURSE SELECT OPTION 2, PLEASE LEAVE A MESSAGE INCLUDING: YOUR NAME DATE OF BIRTH CALL BACK NUMBER REASON FOR CALL**this is important as we prioritize the call backs  YOU WILL RECEIVE A CALL BACK THE SAME DAY AS LONG AS YOU CALL BEFORE 4:00 PM  At the [facility], you and your health needs are our priority. As part of our continuing mission to provide you with exceptional heart care, we have created designated Provider Care Teams. These Care Teams include your primary Cardiologist (physician) and Advanced Practice Providers (APPs- Physician Assistants and Nurse Practitioners) who all work together to provide you with the care you need, when you need it.   You may see any of the following providers on your designated Care Team at your next follow up: Dr Arvilla Meres Dr Marca Ancona Dr. Dorthula Nettles Dr. Clearnce Hasten Amy Filbert Schilder, NP Robbie Lis, [facility] [place], [place] Brynda Peon, NP [place] Lee, NP Karle Plumber, PharmD   Please be sure to bring in all your medications bottles to every appointment.    Thank you for choosing [facility]

## 2023-03-28 NOTE — Progress Notes (Signed)
PCP: Billie Lade, MD HF Cardiology: Dr. Shirlee Latch  62 y.o. with history of CAD, ischemic cardiomyopathy, and PAD was referred by Dr. Darl Householder for evaluation of CHF.  She had NSTEMI in 11/17 with PCI to prox/mid LCx and RCA.  Subsequently, has developed ischemic cardiomyopathy.  Most recent echo in 1/21 showed EF 30-35%.  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   RHC/LHC done with exertional chest heaviness and dyspnea in 4/21 showed normal filling pressures, preserved cardiac output, and nonobstructive mild CAD.  ABIs were normal in 4/21.   In 8/21, she had syncope thought to be related to orthostasis from low BP in the setting of cardiac medication titration.  Entresto was decreased.   She was in the ER in 11/21 with chest pain.  Troponin and COVID-19 negative. Echo in 11/21 showed EF 30-35%, normal RV.   She was hospitalized in 6/22 with left-sided weakness/numbness.  No evidence for acute CVA, ?TIA.    Echo in 11/22 showed EF 30-35% with mild LV dilation, normal RV, mild-moderate MR, IVC normal.   Echo 2/24 showed EF remains 30-35% with normal RV.   Patient had left shoulder replacement in 11/24. She has had significant pain post-op and has been taking oxycodone.  BP has been running lower.  She was seen in the 11/17 due to lightheadedness and low BP.  No syncope.  BP today 88/50. She is still taking oxycodone but is trying to cut back. She has been more short of breath since surgery, dyspneic walking into the office today.  No chest pain.  Weight is actually down.  No orthopnea/PND.    Today she returns for HF follow up. She had quit smoking with Chantix but restarted recently due to stress of grand-daughter's illness.  No dyspnea with normal activities.  She does get mildly short of breath by the end of her 2 mile walk (several times/week for exercise).  No orthopnea/PND.  No lightheadedness.  No chest pain.  She is working part-time as a Conservation officer, nature  at [employer]. [employer] down 11 lbs.  She has been having trouble with poor appetite and nausea again, GI planning for EGD.   ECG (personally reviewed): sinus tachy 114 bpm  Medtronic device interrogation (personally reviewed): No VT or AF, fluid index > threshold but impedance seems to be starting to trend back up.   Labs (2/21): K 3.7, creatinine 0.82 Labs (4/21): K 4.3, creatinine 0.81, LDL 67, TGs 286 Labs (6/21): K 4.7, creatinine 1.22 Labs (11/21): K 3.7, creatinine 0.84, hgb 12.6, LDL 67, HDL 48, TGs 286 Labs (1/22). K 4.4, creatinine 1.03 Labs (6/22): K 3.5, creatinine 1.0 Labs (11/23): K 3.1, creatinine 0.97 Labs (12/23): K 3.8, creatinine 0.97 Labs (3/24): K 3.9, creatinine 1.09, LDL 78 Labs (5/24): K 3.2, creatinine 1.11, BNP 111 Labs (6/24): LDL 20, TGs 198 Labs (11/24): K 4.1, creatinine 1.66  PMH: 1. CAD: NSTEMI with DES to proximal and mid LCx in 11/17, staged DES x 2 to RCA later in 11/17.  - Cardiolite (10/20): EF < 30%, large inferior and inferolateral MI with mild peri-infarct ischemia.  - LHC (4/21): Patent stents, nonobstructive CAD.  2. Chronic systolic CHF: Ischemic cardiomyopathy. Medtronic ICD.  - Echo (10/20): EF 35-40%, lateral WMAs, normal RV. - Echo (1/21): EF 30-35%, mild LVH, normal RV - RHC (4/21): mean RA 5, PA 29/3, mean PCWP 12, CI 3.04 - Echo (11/21): EF 30-35%, normal RV.  - Echo (11/22): EF 30-35% with  mild LV dilation, normal RV, mild-moderate MR, IVC normal.  - Echo (2/24): EF 30-35%, normal RV.  3. 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  4. Carotid stenosis: Carotid dopplers (10/20) with 60-79% LICA stenosis.  - Carotid dopplers (6/21): 40-59% BICA stenosis.  - Carotid dopplers (1/22): 40-59% RICA, 60-79% LICA.  - Carotid dopplers (4/23): 40-59% LICA - Carotid dopplers (12/23): 40-59% LICA 5. Prior smoker.  6. PAD: ABIs (4/21) Normal.  - Peripheral artery dopplers (12/21): bilateral plaque without focal  stenosis.  7. Left shoulder replacement 11/24  Social History   Socioeconomic History   Marital status: Married    Spouse name: Not on file   Number of children: Not on file   Years of education: Not on file   Highest education level: Associate degree: occupational, Scientist, product/process development, or vocational program  Occupational History   Occupation: CNA  Tobacco Use   Smoking status: Some Days    Current packs/day: 0.25    Average packs/day: 0.3 packs/day for 15.0 years (3.8 ttl pk-yrs)    Types: Cigarettes   Smokeless tobacco: Never   Tobacco comments:    smokes a cigarette occasionally  Vaping Use   Vaping status: Never Used  Substance and Sexual Activity   Alcohol use: Not Currently    Comment: occasionally   Drug use: Yes    Types: Marijuana   Sexual activity: Not Currently    Birth control/protection: Surgical    Comment: hyst  Other Topics Concern   Not on file  Social History Narrative   Lives with husband in [place] in a one story home with a basement.  Has 4 children.  Works as a Lawyer.  Education: CNA school.    Social Determinants of Health   Financial Resource Strain: Low Risk  (02/14/2023)   Overall Financial Resource Strain (CARDIA)    Difficulty of Paying Living Expenses: Not hard at all  Food Insecurity: No Food Insecurity (03/21/2023)   Hunger Vital Sign    Worried About Running Out of Food in the Last Year: Never true    Ran Out of Food in the Last Year: Never true  Transportation Needs: No Transportation Needs (03/21/2023)   PRAPARE - Administrator, Civil Service (Medical): No    Lack of Transportation (Non-Medical): No  Physical Activity: Insufficiently Active (02/14/2023)   Exercise Vital Sign    Days of Exercise per Week: 3 days    Minutes of Exercise per Session: 30 min  Stress: Stress Concern Present (02/14/2023)   [employer] of Occupational Health - Occupational Stress Questionnaire    Feeling of Stress : Very much  Social Connections:  Socially Isolated (02/14/2023)   Social Connection and Isolation Panel [NHANES]    Frequency of Communication with Friends and Family: Never    Frequency of Social Gatherings with Friends and Family: Never    Attends Religious Services: Never    Database administrator or Organizations: No    Attends Engineer, structural: Not on file    Marital Status: Married  Catering manager Violence: Not At Risk (03/21/2023)   Humiliation, Afraid, Rape, and Kick questionnaire    Fear of Current or Ex-Partner: No    Emotionally Abused: No    Physically Abused: No    Sexually Abused: No   Family History  Problem Relation Age of Onset   Stroke Mother    Hypertension Mother    Diabetes Mother    Heart attack Mother  Heart attack Father    Diabetes Father    Hypertension Father    CAD Father    Colon polyps Father 43       pre-cancerous    Stroke Father    Dementia Father    Hyperlipidemia Father    Arthritis Father    COPD Father    Heart disease Father    Breast cancer Maternal Grandmother    Diabetes Maternal Grandmother    Cancer Maternal Grandfather        Tongue and esophageal   Anxiety disorder Daughter    Depression Daughter    Anxiety disorder Daughter    Heart failure Other    Cancer Paternal Grandmother    Colon cancer Neg Hx    ROS: All systems reviewed and negative except as per HPI.   Current Outpatient Medications  Medication Sig Dispense Refill   aspirin EC 81 MG tablet Take 81 mg by mouth every evening.      atorvastatin (LIPITOR) 80 MG tablet Take 1 tablet (80 mg total) by mouth daily. 90 tablet 3   clopidogrel (PLAVIX) 75 MG tablet Take 1 tablet by mouth once daily 30 tablet 11   cyproheptadine (PERIACTIN) 4 MG tablet Take 8 mg by mouth 2 (two) times daily.     dapagliflozin propanediol (FARXIGA) 10 MG TABS tablet Take 1 tablet (10 mg total) by mouth daily before breakfast. 30 tablet 11   EPINEPHrine (EPIPEN 2-PAK) 0.3 mg/0.3 mL IJ SOAJ injection Inject  0.3 mg into the muscle as needed for anaphylaxis. 2 each 2   Evolocumab (REPATHA SURECLICK) 140 MG/ML SOAJ Inject 140 mg into the skin every 14 (fourteen) days. 6 mL 3   fenofibrate (TRICOR) 145 MG tablet Take 1 tablet (145 mg total) by mouth daily. 90 tablet 3   gabapentin (NEURONTIN) 300 MG capsule Take 1 capsule by mouth at bedtime 30 capsule 0   linaclotide (LINZESS) 145 MCG CAPS capsule Take 1 capsule (145 mcg total) by mouth daily before breakfast. 30 capsule 11   loperamide (IMODIUM A-D) 2 MG tablet Take 2 mg by mouth 4 (four) times daily as needed for diarrhea or loose stools.     metoprolol succinate (TOPROL XL) 25 MG 24 hr tablet Take 1 tablet (25 mg total) by mouth daily. 90 tablet 3   Multiple Vitamins-Minerals (MULTIVITAMIN WITH MINERALS) tablet Take 1 tablet by mouth daily.     nitroGLYCERIN (NITROSTAT) 0.4 MG SL tablet Place 1 tablet (0.4 mg total) under the tongue every 5 (five) minutes x 3 doses as needed for chest pain (if no relief after 2nd dose, proceed to the ED for an evaluation or call 911). 25 tablet 2   ondansetron (ZOFRAN) 4 MG tablet Take 1 tablet (4 mg total) by mouth every 6 (six) hours as needed for nausea or vomiting. 60 tablet 5   oxyCODONE (OXY IR/ROXICODONE) 5 MG immediate release tablet Take 1 tablet (5 mg total) by mouth every 6 (six) hours as needed for moderate pain (pain score 4-6) (pain score 4-6). 30 tablet 0   pantoprazole (PROTONIX) 40 MG tablet TAKE 1 TABLET BY MOUTH TWICE DAILY BEFORE A MEAL 60 tablet 3   potassium chloride SA (KLOR-CON M) 20 MEQ tablet Take 2 tablets (40 mEq total) by mouth every morning AND 1 tablet (20 mEq total) every evening.     rOPINIRole (REQUIP) 0.5 MG tablet Take 0.5 mg by mouth at bedtime.   0   scopolamine (TRANSDERM-SCOP) 1 MG/3DAYS Place 1  patch (1.5 mg total) onto the skin every 3 (three) days. 10 patch 5   sucralfate (CARAFATE) 1 GM/10ML suspension Take 10 mLs (1 g total) by mouth 4 (four) times daily. 420 mL 1    varenicline (CHANTIX) 1 MG tablet Take 1 tablet (1 mg total) by mouth 2 (two) times daily. 60 tablet 3   Vitamin D, Ergocalciferol, (DRISDOL) 1.25 MG (50000 UNIT) CAPS capsule Take 50,000 Units by mouth every 7 (seven) days.     furosemide (LASIX) 20 MG tablet Take 2 tablets (40 mg total) by mouth every morning AND 1 tablet (20 mg total) every evening. 180 tablet 3   losartan (COZAAR) 25 MG tablet Take 0.5 tablets (12.5 mg total) by mouth at bedtime.     spironolactone (ALDACTONE) 25 MG tablet Take 0.5 tablets (12.5 mg total) by mouth at bedtime.     [name] 1 g capsule Take 2 capsules (2 g total) by mouth 2 (two) times daily. (Patient not taking: Reported on 03/28/2023) 120 capsule 11   No current facility-administered medications for this encounter.   Wt Readings from Last 3 Encounters:  03/28/23 49.8 kg (109 lb 12.8 oz)  03/26/23 50 kg (110 lb 3.7 oz)  03/21/23 49.9 kg (110 lb)   BP (!) 88/50   Pulse (!) 110   Wt 49.8 kg (109 lb 12.8 oz)   SpO2 99%   BMI 21.44 kg/m  General: NAD Neck: JVP difficult, no thyromegaly or thyroid nodule.  Lungs: Clear to auscultation bilaterally with normal respiratory effort. CV: Nondisplaced PMI.  Heart regular S1/S2, no S3/S4, no murmur.  No peripheral edema.  No carotid bruit.  Normal pedal pulses.  Abdomen: Soft, nontender, no hepatosplenomegaly, no distention.  Skin: Intact without lesions or rashes.  Neurologic: Alert and oriented x 3.  Psych: Normal affect. Extremities: No clubbing or cyanosis.  HEENT: Normal.   Assessment/Plan: 1. CAD: S/p NSTEMI in 11/17 with DEX to LCx and DES x 2 to RCA.  Cardiolite in 10/20 with EF <30%, inferior/inferolateral infarct with peri-infarct ischemia. LHC in 4/21 showed nonobstructive mild CAD.  No chest pain.  - Continue atorvastatin + Repatha. Good lipids in 6/24.  - Continue ASA 81 and Plavix 75 mg daily for now.  She will continue Plavix as long as Dr. Corliss Skains feels it is needed post her cranial  circulation intervention.  2. Chronic systolic CHF: Ischemic cardiomyopathy.  RHC in 4/21 with normal filling pressures and preserved cardiac output.  Echo 2/24 showed stable EF 30-35%. NYHA class III symptoms.  She is volume overloaded by OptiVol though not evident on exam.  Suspect she developed volume overload with post-op fluids after shoulder replacement. BP has been running low with pain medication use and she has had some orthostatic symptoms.  - Continue Farixga 10 mg daily  - Decrease losartan to 12.5 mg at bedtime (unable to take Entresto in past due to hypotension).  - Increase Lasix to 40 mg qam/20 mg qpm. BMET/BNP today and in 10 days.  - Continue Toprol XL 25 mg daily.  - Decrease spironolactone to 12.5 mg at bedtime.   3. Carotid stenosis: Repeat carotids in 12/24.  4. Smoking: She quit in 10/24, congratulated.  5. 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  - Continue Plavix per Dr. Corliss Skains.   Follow up in 3 wks with APP  Marca Ancona  03/28/2023

## 2023-03-29 ENCOUNTER — Other Ambulatory Visit: Payer: Self-pay | Admitting: *Deleted

## 2023-03-29 DIAGNOSIS — M19012 Primary osteoarthritis, left shoulder: Secondary | ICD-10-CM

## 2023-03-29 DIAGNOSIS — Z96612 Presence of left artificial shoulder joint: Secondary | ICD-10-CM

## 2023-03-29 NOTE — Discharge Summary (Signed)
Physician Discharge Summary      Patient ID: Erin [name] MRN: [id number] DOB/AGE: Jan 04, 1961 61 y.o.  Admit date: 03/21/2023 Discharge date: 03/22/23   Admission Diagnoses:  Principal Problem:   OA (osteoarthritis) of shoulder Active Problems:   S/P reverse total shoulder arthroplasty, left   Discharge Diagnoses:  Same  Surgeries: Procedure(s): LEFT REVERSE SHOULDER ARTHROPLASTY BICEPS TENODESIS on 03/21/2023   Consultants:   Discharged Condition: Stable  Hospital Course: Erin [name] is an 62 y.o. female who was admitted 03/21/2023 with a chief complaint of left shoulder pain, and found to have a diagnosis of left shoulder arthritis.  They were brought to the operating room on 03/21/2023 and underwent the above named procedures.  Pt awoke from anesthesia without complication and was transferred to the floor. On POD1, patient's pain was controlled and block was in effect.  She did have some continual beeping from her pacemaker which prompted evaluation by [employer] rep.  After this was sorted we discussed possibility of discharge for [name] and with her blood pressure near its baseline for her typically after surgery of her rounds 90-95 systolic, she was discharged home.  She had no red flag signs or symptoms such as chest pain/shortness of breath/dizziness/lightheadedness/palpitations during her stay.  Pt will f/u with Dr. August Saucer in clinic in ~2 weeks.   Antibiotics given:  Anti-infectives (From admission, onward)    Start     Dose/Rate Route Frequency Ordered Stop   03/21/23 2200  ceFAZolin (ANCEF) IVPB 2g/100 mL premix        2 g 200 mL/hr over 30 Minutes Intravenous Every 8 hours 03/21/23 1703 03/22/23 1620   03/21/23 1225  vancomycin (VANCOCIN) powder  Status:  Discontinued          As needed 03/21/23 1225 03/21/23 1500   03/21/23 0917  ceFAZolin (ANCEF) 2-4 GM/100ML-% IVPB       Note to Pharmacy: Kathrene Bongo D: cabinet override      03/21/23 0917 03/21/23  1159   03/21/23 0915  ceFAZolin (ANCEF) IVPB 2g/100 mL premix        2 g 200 mL/hr over 30 Minutes Intravenous On call to O.R. 03/21/23 0910 03/21/23 1215     .  Recent vital signs:  Vitals:   03/22/23 1240 03/22/23 1411  BP: 96/63 102/63  Pulse: 83 (!) 102  Resp:  18  Temp:  99.3 F (37.4 C)  SpO2:  97%    Recent laboratory studies:  Results for orders placed or performed during the hospital encounter of 03/14/23  Surgical pcr screen   Specimen: Nasal Mucosa; Nasal Swab  Result Value Ref Range   MRSA, PCR NEGATIVE NEGATIVE   Staphylococcus aureus NEGATIVE NEGATIVE  CBC  Result Value Ref Range   WBC 9.0 4.0 - 10.5 K/uL   RBC 4.39 3.87 - 5.11 MIL/uL   Hemoglobin 12.2 12.0 - 15.0 g/dL   HCT 02.5 42.7 - 06.2 %   MCV 83.4 80.0 - 100.0 fL   MCH 27.8 26.0 - 34.0 pg   MCHC 33.3 30.0 - 36.0 g/dL   RDW 37.6 (H) 28.3 - 15.1 %   Platelets 501 (H) 150 - 400 K/uL   nRBC 0.0 0.0 - 0.2 %  Basic metabolic panel  Result Value Ref Range   Sodium 140 135 - 145 mmol/L   Potassium 3.9 3.5 - 5.1 mmol/L   Chloride 101 98 - 111 mmol/L   CO2 26 22 - 32 mmol/L   Glucose, Bld 99  70 - 99 mg/dL   BUN 21 (H) 6 - 20 mg/dL   Creatinine, Ser 8.65 (H) 0.44 - 1.00 mg/dL   Calcium 78.4 8.9 - 69.6 mg/dL   GFR, Estimated 33 (L) >60 mL/min   Anion gap 13 5 - 15  Urinalysis, w/ Reflex to Culture (Infection Suspected) -Urine, Clean Catch  Result Value Ref Range   Specimen Source URINE, CLEAN CATCH    Color, Urine STRAW (A) YELLOW   APPearance CLEAR CLEAR   Specific Gravity, Urine 1.007 1.005 - 1.030   pH 6.0 5.0 - 8.0   Glucose, UA >=500 (A) NEGATIVE mg/dL   Hgb urine dipstick NEGATIVE NEGATIVE   Bilirubin Urine NEGATIVE NEGATIVE   Ketones, ur NEGATIVE NEGATIVE mg/dL   Protein, ur NEGATIVE NEGATIVE mg/dL   Nitrite NEGATIVE NEGATIVE   Leukocytes,Ua NEGATIVE NEGATIVE   RBC / HPF 0-5 0 - 5 RBC/hpf   WBC, UA 0-5 0 - 5 WBC/hpf   Bacteria, UA NONE SEEN NONE SEEN   Squamous Epithelial / HPF 0-5 0  - 5 /HPF   *Note: Due to a large number of results and/or encounters for the requested time period, some results have not been displayed. A complete set of results can be found in Results Review.    Discharge Medications:   Allergies as of 03/22/2023       Reactions   Alpha-gal Shortness Of Breath, Nausea And Vomiting, Dermatitis   Other Shortness Of Breath, Diarrhea, Nausea And Vomiting, Nausea Only   All- red meats Medications in Capsule form    Tape Other (See Comments)   PEELS SKIN OFF  (PAPER TAPE IS FINE)        Medication List     STOP taking these medications    traMADol 50 MG tablet Commonly known as: ULTRAM       TAKE these medications    aspirin EC 81 MG tablet Take 81 mg by mouth every evening.   atorvastatin 80 MG tablet Commonly known as: LIPITOR Take 1 tablet (80 mg total) by mouth daily.   clopidogrel 75 MG tablet Commonly known as: PLAVIX Take 1 tablet by mouth once daily   cyproheptadine 4 MG tablet Commonly known as: PERIACTIN Take 8 mg by mouth 2 (two) times daily.   dapagliflozin propanediol 10 MG Tabs tablet Commonly known as: Farxiga Take 1 tablet (10 mg total) by mouth daily before breakfast.   EPINEPHrine 0.3 mg/0.3 mL Soaj injection Commonly known as: EpiPen 2-Pak Inject 0.3 mg into the muscle as needed for anaphylaxis.   fenofibrate 145 MG tablet Commonly known as: Tricor Take 1 tablet (145 mg total) by mouth daily.   gabapentin 300 MG capsule Commonly known as: NEURONTIN Take 1 capsule by mouth at bedtime   linaclotide 145 MCG Caps capsule Commonly known as: Linzess Take 1 capsule (145 mcg total) by mouth daily before breakfast.   loperamide 2 MG tablet Commonly known as: IMODIUM A-D Take 2 mg by mouth 4 (four) times daily as needed for diarrhea or loose stools.   metoprolol succinate 25 MG 24 hr tablet Commonly known as: Toprol XL Take 1 tablet (25 mg total) by mouth daily.   multivitamin with minerals  tablet Take 1 tablet by mouth daily.   nitroGLYCERIN 0.4 MG SL tablet Commonly known as: NITROSTAT Place 1 tablet (0.4 mg total) under the tongue every 5 (five) minutes x 3 doses as needed for chest pain (if no relief after 2nd dose, proceed to the ED for  an evaluation or call 911).   ondansetron 4 MG tablet Commonly known as: Zofran Take 1 tablet (4 mg total) by mouth every 6 (six) hours as needed for nausea or vomiting.   oxyCODONE 5 MG immediate release tablet Commonly known as: Oxy IR/ROXICODONE Take 1 tablet (5 mg total) by mouth every 6 (six) hours as needed for moderate pain (pain score 4-6) (pain score 4-6).   pantoprazole 40 MG tablet Commonly known as: PROTONIX TAKE 1 TABLET BY MOUTH TWICE DAILY BEFORE A MEAL   Repatha SureClick 140 MG/ML Soaj Generic drug: Evolocumab Inject 140 mg into the skin every 14 (fourteen) days.   rOPINIRole 0.5 MG tablet Commonly known as: REQUIP Take 0.5 mg by mouth at bedtime.   scopolamine 1 MG/3DAYS Commonly known as: TRANSDERM-SCOP Place 1 patch (1.5 mg total) onto the skin every 3 (three) days.   sucralfate 1 GM/10ML suspension Commonly known as: Carafate Take 10 mLs (1 g total) by mouth 4 (four) times daily.   varenicline 1 MG tablet Commonly known as: CHANTIX Take 1 tablet (1 mg total) by mouth 2 (two) times daily.   [name] 1 g capsule Generic drug: icosapent Ethyl Take 2 capsules (2 g total) by mouth 2 (two) times daily.   Vitamin D (Ergocalciferol) 1.25 MG (50000 UNIT) Caps capsule Commonly known as: DRISDOL Take 50,000 Units by mouth every 7 (seven) days.        Diagnostic Studies: DG Chest Portable 1 View  Result Date: 03/26/2023 CLINICAL DATA:  Shortness of breath EXAM: PORTABLE CHEST 1 VIEW COMPARISON:  01/12/2023 FINDINGS: Cardiac shadow is stable. Defibrillator is again seen and stable. Left shoulder replacement is now seen in satisfactory position. The lungs are clear bilaterally. No focal infiltrate or  effusion is noted. No acute bony abnormality is seen. IMPRESSION: No acute abnormality noted. Electronically Signed   By: Alcide Clever M.D.   On: 03/26/2023 22:54   CT CHEST W CONTRAST  Result Date: 03/21/2023 CLINICAL DATA:  Lung nodule. EXAM: CT CHEST WITH CONTRAST TECHNIQUE: Multidetector CT imaging of the chest was performed during intravenous contrast administration. RADIATION DOSE REDUCTION: This exam was performed according to the departmental dose-optimization program which includes automated exposure control, adjustment of the mA and/or kV according to patient size and/or use of iterative reconstruction technique. CONTRAST:  65mL ISOVUE-300 IOPAMIDOL (ISOVUE-300) INJECTION 61% COMPARISON:  Shoulder CT 01/18/2023.  Chest CT 05/10/2019. FINDINGS: Cardiovascular: Heart is slightly enlarged. Small pericardial effusion. Left upper chest battery pack with leads extending along the right side of the heart. There is focal area of presumed nonocclusive thrombus along the course of the pacemaker lead in the upper SVC. Example series 2, image 46. Mediastinum/Nodes: Normal caliber thoracic esophagus. Preserved thyroid gland. No specific abnormal lymph node enlargement identified in the axillary regions, hilum or mediastinum. Lungs/Pleura: No consolidation, pneumothorax or effusion. The ill-defined areas of ground-glass with some air cysts change of the left upper lobe measuring 17 mm on the recent shoulder CT is again seen today on series 8, image 28. This has actually is present going back to the remote study of 2021 Upper Abdomen: Stable left adrenal nodule going back to January 2021 demonstrating long-term stability. There is slight nodularity of the right adrenal gland as well. Fatty liver infiltration. Musculoskeletal: Mild degenerative changes along the spine. IMPRESSION: Left upper lobe ground-glass opacity is stable going back to January 2021 demonstrating long-term stability. No additional imaging  follow-up. Fatty liver infiltration. Small pericardial effusion. Left upper chest defibrillator. Along  the course of the lead in the upper SVC is some nonocclusive thrombus. The findings will be called to the ordering service by the Radiology physician assistant team Aortic Atherosclerosis (ICD10-I70.0). Electronically Signed   By: Karen Kays M.D.   On: 03/21/2023 17:37   DG Shoulder Left Port  Result Date: 03/21/2023 CLINICAL DATA:  Status post shoulder surgery. EXAM: LEFT SHOULDER COMPARISON:  None Available. FINDINGS: Reverse left shoulder arthroplasty in expected alignment. No periprosthetic lucency or fracture. Recent postsurgical change includes air and edema in the joint space and soft tissues. IMPRESSION: Reverse left shoulder arthroplasty without immediate postoperative complication. Electronically Signed   By: Narda Rutherford M.D.   On: 03/21/2023 17:23    Disposition: Discharge disposition: 01-Home or Self Care       Discharge Instructions     Call MD / Call 911   Complete by: As directed    If you experience chest pain or shortness of breath, CALL 911 and be transported to the hospital emergency room.  If you develope a fever above 101 F, pus (white drainage) or increased drainage or redness at the wound, or calf pain, call your surgeon's office.   Constipation Prevention   Complete by: As directed    Drink plenty of fluids.  Prune juice may be helpful.  You may use a stool softener, such as Colace (over the counter) 100 mg twice a day.  Use MiraLax (over the counter) for constipation as needed.   Diet - low sodium heart healthy   Complete by: As directed    Discharge instructions   Complete by: As directed    You may shower, dressing is waterproof.  Do not bathe or soak the operative shoulder in a tub, pool.  Use the CPM machine 3 times a day for one hour each time.  No lifting with the operative shoulder. Continue use of the sling.  Follow-up with Dr. August Saucer or Drema Pry in  ~2 weeks on your given appointment date.  We will remove your adhesive bandage at that time.  Do not take your blood pressure medication until you see your primary care provider and discuss this with them.  Call your PCP in order to schedule an appointment for evaluation of postop low blood pressure before your postop appointment for your shoulder replacement.  Dental Antibiotics:  In most cases prophylactic antibiotics for Dental procdeures after total joint surgery are not necessary.  Exceptions are as follows:  1. History of prior total joint infection  2. Severely immunocompromised (Organ Transplant, cancer chemotherapy, Rheumatoid biologic meds such as Humera)  3. Poorly controlled diabetes (A1C &gt; 8.0, blood glucose over 200)  If you have one of these conditions, contact your surgeon for an antibiotic prescription, prior to your dental procedure.   Increase activity slowly as tolerated   Complete by: As directed    Post-operative opioid taper instructions:   Complete by: As directed    POST-OPERATIVE OPIOID TAPER INSTRUCTIONS: It is important to wean off of your opioid medication as soon as possible. If you do not need pain medication after your surgery it is ok to stop day one. Opioids include: Codeine, Hydrocodone(Norco, Vicodin), Oxycodone(Percocet, oxycontin) and hydromorphone amongst others.  Long term and even short term use of opiods can cause: Increased pain response Dependence Constipation Depression Respiratory depression And more.  Withdrawal symptoms can include Flu like symptoms Nausea, vomiting And more Techniques to manage these symptoms Hydrate well Eat regular healthy meals Stay active Use relaxation techniques(deep  breathing, meditating, yoga) Do Not substitute Alcohol to help with tapering If you have been on opioids for less than two weeks and do not have pain than it is ok to stop all together.  Plan to wean off of opioids This plan should  start within one week post op of your joint replacement. Maintain the same interval or time between taking each dose and first decrease the dose.  Cut the total daily intake of opioids by one tablet each day Next start to increase the time between doses. The last dose that should be eliminated is the evening dose.             SignedKarenann Cai 03/29/2023, 9:45 AM

## 2023-03-31 ENCOUNTER — Other Ambulatory Visit (HOSPITAL_COMMUNITY): Payer: Medicare PPO

## 2023-04-01 ENCOUNTER — Other Ambulatory Visit: Payer: Self-pay | Admitting: Internal Medicine

## 2023-04-01 ENCOUNTER — Other Ambulatory Visit: Payer: Self-pay | Admitting: Allergy & Immunology

## 2023-04-01 DIAGNOSIS — G6289 Other specified polyneuropathies: Secondary | ICD-10-CM

## 2023-04-02 DIAGNOSIS — M19012 Primary osteoarthritis, left shoulder: Secondary | ICD-10-CM

## 2023-04-02 DIAGNOSIS — M7522 Bicipital tendinitis, left shoulder: Secondary | ICD-10-CM

## 2023-04-05 ENCOUNTER — Encounter: Payer: Self-pay | Admitting: Surgical

## 2023-04-05 ENCOUNTER — Other Ambulatory Visit (INDEPENDENT_AMBULATORY_CARE_PROVIDER_SITE_OTHER): Payer: Self-pay

## 2023-04-05 ENCOUNTER — Ambulatory Visit: Payer: Medicare PPO | Admitting: Surgical

## 2023-04-05 DIAGNOSIS — Z96612 Presence of left artificial shoulder joint: Secondary | ICD-10-CM

## 2023-04-05 MED ORDER — TRAMADOL HCL 50 MG PO TABS: 50.0000 mg | ORAL_TABLET | Freq: Four times a day (QID) | ORAL | 0 refills | Status: DC | PRN

## 2023-04-05 NOTE — Progress Notes (Signed)
Post-Op Visit Note   Patient: Erin [name]           Date of Birth: 24-May-1960           MRN: [id number] Visit Date: 04/05/2023 PCP: Erin Lade, MD   Assessment & Plan:  Chief Complaint:  Chief Complaint  Patient presents with   Left Shoulder - Routine Post Op   Visit Diagnoses:  1. S/P reverse total shoulder arthroplasty, left     Plan: [name] is a 62 y.o. female who presents s/p left reverse shoulder arthroplasty on 03/21/2023.  Patient is doing well and pain is overall controlled.  Using CPM machine as instructed.  Denies any chest pain, SOB, fevers, chills.  No complaint of any instability symptoms.  No more beeping from her pacemaker.  No palpitations.  Oxycodone was too strong for her so she is just been taking an old prescription of tramadol..    On exam, patient has range of motion 15 degrees X rotation, 50 degrees abduction, 90 degrees forward elevation passively.  Intact EPL, FPL, finger abduction, finger adduction, pronation/supination, bicep, tricep, deltoid of operative extremity.  Axillary nerve intact with deltoid firing.  Incision is healing well without evidence of infection or dehiscence.  Incision was made sure to be covered with Steri-Strips from the proximal to distal aspect of the length of the incision.  2+ radial pulse of the operative extremity.  3 nylon sutures removed from the incision today and Steri-Strips placed.  Plan is discontinue sling.  Okay to very lightly lift with the operative extremity but no lifting anything heavier than a coffee cup or cell phone.  Start physical therapy to focus on passive range of motion and active range of motion with deltoid isometrics.  Do not want to externally rotate past 30 degrees to protect subscapularis repair.  Follow-up in 4 weeks for clinical recheck with Dr. August [name].  Tramadol prescribed  Follow-Up Instructions: No follow-ups on file.   Orders:  Orders Placed This Encounter  Procedures   XR  Shoulder Left   Meds ordered this encounter  Medications   traMADol (ULTRAM) 50 MG tablet    Sig: Take 1 tablet (50 mg total) by mouth every 6 (six) hours as needed.    Dispense:  30 tablet    Refill:  0    Imaging: No results found.  PMFS History: Patient Active Problem List   Diagnosis Date Noted   Arthritis of left shoulder region 04/02/2023   Biceps tendonitis on left 04/02/2023   OA (osteoarthritis) of shoulder 03/21/2023   S/P reverse total shoulder arthroplasty, left 03/21/2023   RUQ pain 06/29/2022   Pancreatic cyst 06/29/2022   Peripheral neuropathy 05/04/2022   Restless leg syndrome 05/04/2022   Hypertriglyceridemia 05/04/2022   Bilateral shoulder pain 05/04/2022   Encounter for general adult medical examination with abnormal findings 05/04/2022   H/O total hysterectomy 04/26/2022   Odynophagia 04/20/2022   Abdominal pain, epigastric 04/20/2022   Nausea without vomiting 06/16/2021   Allergy to alpha-gal 06/16/2021   Orthostatic hypotension 06/16/2021   Hypocortisolemia (HCC) 03/02/2021   Adrenal adenoma, left 03/02/2021   AKI (acute kidney injury) (HCC) 01/04/2021   Intractable nausea and vomiting 01/03/2021   Left-sided weakness 10/19/2020   Cough 02/11/2020   Near syncope 12/10/2019   Brain aneurysm 04/15/2019   Dysphagia 02/27/2018   Encounter for screening colonoscopy 02/27/2018   History of Clostridium difficile infection 02/27/2018   Chronic diarrhea 12/20/2017   Chronic combined systolic  and diastolic congestive heart failure (HCC) 06/20/2016   Hypokalemia due to excessive gastrointestinal loss of potassium    Acute CHF (congestive heart failure) (HCC) 05/16/2016   Acute on chronic systolic CHF (congestive heart failure) (HCC) 05/16/2016   Acute respiratory failure with hypoxia (HCC)    Thrombocytosis 03/26/2016   Cardiomyopathy, ischemic 03/25/2016   Chronic combined systolic and diastolic heart failure (HCC) 03/25/2016   Anxiety state 03/25/2016    Troponin level elevated 03/25/2016   Coronary artery disease involving native coronary artery of native heart 03/25/2016   Normocytic anemia 03/25/2016   SOB (shortness of breath) 03/24/2016   Lightheadedness 03/17/2016   Hypotension 03/17/2016   Tobacco abuse 03/12/2016   NSTEMI (non-ST elevated myocardial infarction) (HCC) 03/11/2016   Atypical chest pain    Essential hypertension 09/06/2015   Hyperlipidemia 09/06/2015   GERD without esophagitis 09/06/2015   Chest pain 09/06/2015   Past Medical History:  Diagnosis Date   AICD (automatic cardioverter/defibrillator) present    Allergic reaction to alpha-gal    Allergy 03/05/2022   Anemia    Anxiety state 03/25/2016   Arthritis    Basal cell carcinoma of forehead    Brain aneurysm    CHF (congestive heart failure) (HCC)    Coronary artery disease    a. 03/11/16 PCI with DES-->Prox/Mid Cx;  b. 03/14/16 PCI with DES x2-->RCA, EF 30-35%.   Depression 06/09/2010   Encounter for general adult medical examination with abnormal findings 05/04/2022   Essential hypertension    Hx   GERD (gastroesophageal reflux disease)    HFrEF (heart failure with reduced ejection fraction) (HCC)    a. 10/2016 Echo: EF 35-40%, Gr1 DD, mild focal basal septal hypertrophy, basal inflat, mid inflat, basal antlat AK. Mid infept/inf/antlat, apical lateral sev HK. Mod MR. mildly reduced RV fxn. Mild TR.   History of pneumonia    Hyperlipidemia    IBS (irritable bowel syndrome)    Ischemic cardiomyopathy    a. 10/2016 Echo: EF 35-40%, Gr1 DD.   Mitral regurgitation    Neuromuscular disorder (HCC)    NSTEMI (non-ST elevated myocardial infarction) (HCC) 03/10/2016   Pneumonia 03/2016   Shingles    Squamous cell cancer of skin of nose    Thrombocytosis 03/26/2016   Tobacco abuse    Trichimoniasis    Wears dentures    Wears glasses     Family History  Problem Relation Age of Onset   Stroke Mother    Hypertension Mother    Diabetes Mother    Heart  attack Mother    Heart attack Father    Diabetes Father    Hypertension Father    CAD Father    Colon polyps Father 92       pre-cancerous    Stroke Father    Dementia Father    Hyperlipidemia Father    Arthritis Father    COPD Father    Heart disease Father    Breast cancer Maternal Grandmother    Diabetes Maternal Grandmother    Cancer Maternal Grandfather        Tongue and esophageal   Anxiety disorder Daughter    Depression Daughter    Anxiety disorder Daughter    Heart failure Other    Cancer Paternal Grandmother    Colon cancer Neg Hx     Past Surgical History:  Procedure Laterality Date   APPENDECTOMY     BICEPT TENODESIS Left 03/21/2023   Procedure: BICEPS TENODESIS;  Surgeon: Cammy Copa, MD;  Location: [facility] OR;  Service: Orthopedics;  Laterality: Left;   BIOPSY  09/20/2018   Procedure: BIOPSY;  Surgeon: Corbin Ade, MD;  Location: [facility];  Service: Endoscopy;;  colon   BIOPSY  01/05/2021   Procedure: BIOPSY;  Surgeon: Rachael Fee, MD;  Location: [facility] ENDOSCOPY;  Service: Endoscopy;;   BIOPSY  04/27/2022   Procedure: BIOPSY;  Surgeon: Lanelle Bal, DO;  Location: [facility];  Service: Endoscopy;;   CARDIAC CATHETERIZATION N/A 03/11/2016   Procedure: Left Heart Cath and Coronary Angiography;  Surgeon: Marykay Lex, MD;  Location: [facility] INVASIVE CV LAB;  Service: Cardiovascular;  Laterality: N/A;   CARDIAC CATHETERIZATION N/A 03/11/2016   Procedure: Coronary Stent Intervention;  Surgeon: Marykay Lex, MD;  Location: [facility] INVASIVE CV LAB;  Service: Cardiovascular;  Laterality: N/A;   CARDIAC CATHETERIZATION N/A 03/14/2016   Procedure: Coronary Stent Intervention;  Surgeon: Peter M Swaziland, MD;  Location: [facility] INVASIVE CV LAB;  Service: Cardiovascular;  Laterality: N/A;   CEREBRAL ANEURYSM REPAIR  04/2019   stent placed   CHOLECYSTECTOMY OPEN  1984   COLONOSCOPY WITH PROPOFOL N/A 09/20/2018   Procedure: COLONOSCOPY WITH PROPOFOL;  Surgeon:  Corbin Ade, MD;  [facility]: [facility];  Service: Endoscopy;  Laterality: N/A;  10:30am   CORONARY ANGIOPLASTY WITH STENT PLACEMENT  03/14/2016   ESOPHAGOGASTRODUODENOSCOPY (EGD) WITH PROPOFOL N/A 09/20/2018   Procedure: ESOPHAGOGASTRODUODENOSCOPY (EGD) WITH PROPOFOL;  Surgeon: Corbin Ade, MD;  [facility];  Service: Endoscopy;  Laterality: N/A;   ESOPHAGOGASTRODUODENOSCOPY (EGD) WITH PROPOFOL N/A 01/05/2021   Procedure: ESOPHAGOGASTRODUODENOSCOPY (EGD) WITH PROPOFOL;  Surgeon: Rachael Fee, MD;  Location: [facility] ENDOSCOPY;  Service: Endoscopy;  Laterality: N/A;   ESOPHAGOGASTRODUODENOSCOPY (EGD) WITH PROPOFOL N/A 04/27/2022   Procedure: ESOPHAGOGASTRODUODENOSCOPY (EGD) WITH PROPOFOL;  Surgeon: Lanelle Bal, DO;  [facility];  Service: Endoscopy;  Laterality: N/A;  9:15am, asa 3/4, ASAP   ESOPHAGOGASTRODUODENOSCOPY (EGD) WITH PROPOFOL N/A 01/04/2023   Procedure: ESOPHAGOGASTRODUODENOSCOPY (EGD) WITH PROPOFOL;  Surgeon: Corbin Ade, MD;  [facility];  Service: Endoscopy;  Laterality: N/A;  130pm, asa 3   FINGER ARTHROPLASTY Left 05/14/2013   Procedure: LEFT THUMB CARPAL METACARPAL ARTHROPLASTY;  Surgeon: Tami Ribas, MD;  Location: [facility];  Service: Orthopedics;  Laterality: Left;   ICD IMPLANT N/A 04/03/2020   Procedure: ICD IMPLANT;  Surgeon: Regan Lemming, MD;  Location: [facility] INVASIVE CV LAB;  Service: Cardiovascular;  Laterality: N/A;   IR ANGIO INTRA EXTRACRAN SEL COM CAROTID INNOMINATE BILAT MOD SED  01/05/2017   IR ANGIO INTRA EXTRACRAN SEL COM CAROTID INNOMINATE BILAT MOD SED  03/19/2019   IR ANGIO INTRA EXTRACRAN SEL COM CAROTID INNOMINATE BILAT MOD SED  06/04/2020   IR ANGIO INTRA EXTRACRAN SEL INTERNAL CAROTID UNI L MOD SED  04/15/2019   IR ANGIO VERTEBRAL SEL VERTEBRAL BILAT MOD SED  01/05/2017   IR ANGIO VERTEBRAL SEL VERTEBRAL BILAT MOD SED  03/19/2019   IR ANGIO VERTEBRAL SEL VERTEBRAL UNI L MOD SED   06/04/2020   IR ANGIOGRAM FOLLOW UP STUDY  04/15/2019   IR RADIOLOGIST EVAL & MGMT  12/30/2016   IR TRANSCATH/EMBOLIZ  04/15/2019   IR US GUIDE VASC ACCESS RIGHT  03/19/2019   IR US GUIDE VASC ACCESS RIGHT  06/04/2020   MALONEY DILATION N/A 09/20/2018   Procedure: Elease Hashimoto DILATION;  Surgeon: Corbin Ade, MD;  [facility];  Service: Endoscopy;  Laterality: N/A;  MALONEY DILATION N/A 01/04/2023   Procedure: Elease Hashimoto DILATION;  Surgeon: Corbin Ade, MD;  Location: [facility];  Service: Endoscopy;  Laterality: N/A;   RADIOLOGY WITH ANESTHESIA N/A 04/15/2019   Procedure: Sharman Crate;  Surgeon: Julieanne Cotton, MD;  Location: [facility] OR;  Service: Radiology;  Laterality: N/A;   REVERSE SHOULDER ARTHROPLASTY Left 03/21/2023   Procedure: LEFT REVERSE SHOULDER ARTHROPLASTY;  Surgeon: Cammy Copa, MD;  Location: [facility] OR;  Service: Orthopedics;  Laterality: Left;   RIGHT/LEFT HEART CATH AND CORONARY ANGIOGRAPHY N/A 08/19/2019   Procedure: RIGHT/LEFT HEART CATH AND CORONARY ANGIOGRAPHY;  Surgeon: Laurey Morale, MD;  Location: [facility] INVASIVE CV LAB;  Service: Cardiovascular;  Laterality: N/A;   TUBAL LIGATION  1987   VAGINAL HYSTERECTOMY  2009   Social History   Occupational History   Occupation: CNA  Tobacco Use   Smoking status: Some Days    Current packs/day: 0.25    Average packs/day: 0.3 packs/day for 15.0 years (3.8 ttl pk-yrs)    Types: Cigarettes   Smokeless tobacco: Never   Tobacco comments:    smokes a cigarette occasionally  Vaping Use   Vaping status: Never Used  Substance and Sexual Activity   Alcohol use: Not Currently    Comment: occasionally   Drug use: Yes    Types: Marijuana   Sexual activity: Not Currently    Birth control/protection: Surgical    Comment: hyst

## 2023-04-10 ENCOUNTER — Encounter (HOSPITAL_COMMUNITY): Payer: Self-pay | Admitting: Occupational Therapy

## 2023-04-10 ENCOUNTER — Other Ambulatory Visit: Payer: Self-pay

## 2023-04-10 ENCOUNTER — Ambulatory Visit (HOSPITAL_COMMUNITY): Payer: Medicare PPO | Attending: Orthopedic Surgery | Admitting: Occupational Therapy

## 2023-04-10 ENCOUNTER — Ambulatory Visit (INDEPENDENT_AMBULATORY_CARE_PROVIDER_SITE_OTHER): Payer: Medicare PPO

## 2023-04-10 DIAGNOSIS — Z96612 Presence of left artificial shoulder joint: Secondary | ICD-10-CM | POA: Insufficient documentation

## 2023-04-10 DIAGNOSIS — Z95810 Presence of automatic (implantable) cardiac defibrillator: Secondary | ICD-10-CM

## 2023-04-10 DIAGNOSIS — M25512 Pain in left shoulder: Secondary | ICD-10-CM | POA: Insufficient documentation

## 2023-04-10 DIAGNOSIS — M25612 Stiffness of left shoulder, not elsewhere classified: Secondary | ICD-10-CM | POA: Insufficient documentation

## 2023-04-10 DIAGNOSIS — I5042 Chronic combined systolic (congestive) and diastolic (congestive) heart failure: Secondary | ICD-10-CM

## 2023-04-10 DIAGNOSIS — I5022 Chronic systolic (congestive) heart failure: Secondary | ICD-10-CM | POA: Diagnosis not present

## 2023-04-10 DIAGNOSIS — R29898 Other symptoms and signs involving the musculoskeletal system: Secondary | ICD-10-CM | POA: Diagnosis not present

## 2023-04-10 DIAGNOSIS — M19012 Primary osteoarthritis, left shoulder: Secondary | ICD-10-CM | POA: Diagnosis not present

## 2023-04-10 NOTE — Patient Instructions (Signed)
1) SHOULDER: Flexion On Table   Place hands on towel placed on table, elbows straight. Lean forward with you upper body, pushing towel away from body.  ___ reps per set, ___ sets per day  2) Abduction (Passive)   With arm out to side, resting on towel placed on table with palm DOWN, keeping trunk away from table, lean to the side while pushing towel away from body.  Repeat ____ times. Do ____ sessions per day.  Copyright  VHI. All rights reserved.     3) Internal Rotation (Assistive)   Seated with elbow bent at right angle and held against side, slide arm on table surface in an inward arc keeping elbow anchored in place. Repeat ____ times. Do ____ sessions per day. Activity: Use this motion to brush crumbs off the table.  Copyright  VHI. All rights reserved.   

## 2023-04-10 NOTE — Therapy (Unsigned)
OUTPATIENT OCCUPATIONAL THERAPY ORTHO EVALUATION  Patient Name: Erin [name] MRN: [id number] DOB:[birth year], 62 y.o., female Today's Date: 04/10/2023   END OF SESSION:  OT End of Session - 04/10/23 1133     Visit Number 1    Number of Visits 17    Date for OT Re-Evaluation 06/09/23    Authorization Type [employer]    OT Start Time 1022    OT Stop Time 1105    OT Time Calculation (min) 43 min    Activity Tolerance Patient tolerated treatment well    Behavior During Therapy WFL for tasks assessed/performed             Past Medical History:  Diagnosis Date   AICD (automatic cardioverter/defibrillator) present    Allergic reaction to alpha-gal    Allergy 03/05/2022   Anemia    Anxiety state 03/25/2016   Arthritis    Basal cell carcinoma of forehead    Brain aneurysm    CHF (congestive heart failure) (HCC)    Coronary artery disease    a. 03/11/16 PCI with DES-->Prox/Mid Cx;  b. 03/14/16 PCI with DES x2-->RCA, EF 30-35%.   Depression 06/09/2010   Encounter for general adult medical examination with abnormal findings 05/04/2022   Essential hypertension    Hx   GERD (gastroesophageal reflux disease)    HFrEF (heart failure with reduced ejection fraction) (HCC)    a. 10/2016 Echo: EF 35-40%, Gr1 DD, mild focal basal septal hypertrophy, basal inflat, mid inflat, basal antlat AK. Mid infept/inf/antlat, apical lateral sev HK. Mod MR. mildly reduced RV fxn. Mild TR.   History of pneumonia    Hyperlipidemia    IBS (irritable bowel syndrome)    Ischemic cardiomyopathy    a. 10/2016 Echo: EF 35-40%, Gr1 DD.   Mitral regurgitation    Neuromuscular disorder (HCC)    NSTEMI (non-ST elevated myocardial infarction) (HCC) 03/10/2016   Pneumonia 03/2016   Shingles    Squamous cell cancer of skin of nose    Thrombocytosis 03/26/2016   Tobacco abuse    Trichimoniasis    Wears dentures    Wears glasses    Past Surgical History:  Procedure Laterality Date   APPENDECTOMY      BICEPT TENODESIS Left 03/21/2023   Procedure: BICEPS TENODESIS;  Surgeon: Cammy Copa, MD;  [facility]: [facility];  Service: Orthopedics;  Laterality: Left;   BIOPSY  09/20/2018   Procedure: BIOPSY;  Surgeon: Corbin Ade, MD;  Location: [facility];  Service: Endoscopy;;  colon   BIOPSY  01/05/2021   Procedure: BIOPSY;  Surgeon: Rachael Fee, MD;  Location: [facility] ENDOSCOPY;  Service: Endoscopy;;   BIOPSY  04/27/2022   Procedure: BIOPSY;  Surgeon: Lanelle Bal, DO;  Location: [facility];  Service: Endoscopy;;   CARDIAC CATHETERIZATION N/A 03/11/2016   Procedure: Left Heart Cath and Coronary Angiography;  Surgeon: Marykay Lex, MD;  Location: [facility] INVASIVE CV LAB;  Service: Cardiovascular;  Laterality: N/A;   CARDIAC CATHETERIZATION N/A 03/11/2016   Procedure: Coronary Stent Intervention;  Surgeon: Marykay Lex, MD;  Location: [facility] INVASIVE CV LAB;  Service: Cardiovascular;  Laterality: N/A;   CARDIAC CATHETERIZATION N/A 03/14/2016   Procedure: Coronary Stent Intervention;  Surgeon: Peter M Swaziland, MD;  Location: [facility] INVASIVE CV LAB;  Service: Cardiovascular;  Laterality: N/A;   CEREBRAL ANEURYSM REPAIR  04/2019   stent placed   CHOLECYSTECTOMY OPEN  1984   COLONOSCOPY WITH PROPOFOL N/A 09/20/2018   Procedure: COLONOSCOPY WITH  PROPOFOL;  Surgeon: Corbin Ade, MD;  [facility]: [facility];  Service: Endoscopy;  Laterality: N/A;  10:30am   CORONARY ANGIOPLASTY WITH STENT PLACEMENT  03/14/2016   ESOPHAGOGASTRODUODENOSCOPY (EGD) WITH PROPOFOL N/A 09/20/2018   Procedure: ESOPHAGOGASTRODUODENOSCOPY (EGD) WITH PROPOFOL;  Surgeon: Corbin Ade, MD;  [facility];  Service: Endoscopy;  Laterality: N/A;   ESOPHAGOGASTRODUODENOSCOPY (EGD) WITH PROPOFOL N/A 01/05/2021   Procedure: ESOPHAGOGASTRODUODENOSCOPY (EGD) WITH PROPOFOL;  Surgeon: Rachael Fee, MD;  Location: [facility] ENDOSCOPY;  Service: Endoscopy;  Laterality: N/A;   ESOPHAGOGASTRODUODENOSCOPY (EGD) WITH  PROPOFOL N/A 04/27/2022   Procedure: ESOPHAGOGASTRODUODENOSCOPY (EGD) WITH PROPOFOL;  Surgeon: Lanelle Bal, DO;  Location: [facility];  Service: Endoscopy;  Laterality: N/A;  9:15am, asa 3/4, ASAP   ESOPHAGOGASTRODUODENOSCOPY (EGD) WITH PROPOFOL N/A 01/04/2023   Procedure: ESOPHAGOGASTRODUODENOSCOPY (EGD) WITH PROPOFOL;  Surgeon: Corbin Ade, MD;  Location: [facility];  Service: Endoscopy;  Laterality: N/A;  130pm, asa 3   FINGER ARTHROPLASTY Left 05/14/2013   Procedure: LEFT THUMB CARPAL METACARPAL ARTHROPLASTY;  Surgeon: Tami Ribas, MD;  Location: [facility];  Service: Orthopedics;  Laterality: Left;   ICD IMPLANT N/A 04/03/2020   Procedure: ICD IMPLANT;  Surgeon: Regan Lemming, MD;  Location: [facility] INVASIVE CV LAB;  Service: Cardiovascular;  Laterality: N/A;   IR ANGIO INTRA EXTRACRAN SEL COM CAROTID INNOMINATE BILAT MOD SED  01/05/2017   IR ANGIO INTRA EXTRACRAN SEL COM CAROTID INNOMINATE BILAT MOD SED  03/19/2019   IR ANGIO INTRA EXTRACRAN SEL COM CAROTID INNOMINATE BILAT MOD SED  06/04/2020   IR ANGIO INTRA EXTRACRAN SEL INTERNAL CAROTID UNI L MOD SED  04/15/2019   IR ANGIO VERTEBRAL SEL VERTEBRAL BILAT MOD SED  01/05/2017   IR ANGIO VERTEBRAL SEL VERTEBRAL BILAT MOD SED  03/19/2019   IR ANGIO VERTEBRAL SEL VERTEBRAL UNI L MOD SED  06/04/2020   IR ANGIOGRAM FOLLOW UP STUDY  04/15/2019   IR RADIOLOGIST EVAL & MGMT  12/30/2016   IR TRANSCATH/EMBOLIZ  04/15/2019   IR US GUIDE VASC ACCESS RIGHT  03/19/2019   IR US GUIDE VASC ACCESS RIGHT  06/04/2020   MALONEY DILATION N/A 09/20/2018   Procedure: Elease Hashimoto DILATION;  Surgeon: Corbin Ade, MD;  Location: [facility];  Service: Endoscopy;  Laterality: N/A;   MALONEY DILATION N/A 01/04/2023   Procedure: Elease Hashimoto DILATION;  Surgeon: Corbin Ade, MD;  Location: [facility];  Service: Endoscopy;  Laterality: N/A;   RADIOLOGY WITH ANESTHESIA N/A 04/15/2019   Procedure: Sharman Crate;  Surgeon:  Julieanne Cotton, MD;  Location: [facility] OR;  Service: Radiology;  Laterality: N/A;   REVERSE SHOULDER ARTHROPLASTY Left 03/21/2023   Procedure: LEFT REVERSE SHOULDER ARTHROPLASTY;  Surgeon: Cammy Copa, MD;  Location: [facility] OR;  Service: Orthopedics;  Laterality: Left;   RIGHT/LEFT HEART CATH AND CORONARY ANGIOGRAPHY N/A 08/19/2019   Procedure: RIGHT/LEFT HEART CATH AND CORONARY ANGIOGRAPHY;  Surgeon: Laurey Morale, MD;  Location: [facility] INVASIVE CV LAB;  Service: Cardiovascular;  Laterality: N/A;   TUBAL LIGATION  1987   VAGINAL HYSTERECTOMY  2009   Patient Active Problem List   Diagnosis Date Noted   Arthritis of left shoulder region 04/02/2023   Biceps tendonitis on left 04/02/2023   OA (osteoarthritis) of shoulder 03/21/2023   S/P reverse total shoulder arthroplasty, left 03/21/2023   RUQ pain 06/29/2022   Pancreatic cyst 06/29/2022   Peripheral neuropathy 05/04/2022   Restless leg syndrome 05/04/2022   Hypertriglyceridemia 05/04/2022   Bilateral  shoulder pain 05/04/2022   Encounter for general adult medical examination with abnormal findings 05/04/2022   H/O total hysterectomy 04/26/2022   Odynophagia 04/20/2022   Abdominal pain, epigastric 04/20/2022   Nausea without vomiting 06/16/2021   Allergy to alpha-gal 06/16/2021   Orthostatic hypotension 06/16/2021   Hypocortisolemia (HCC) 03/02/2021   Adrenal adenoma, left 03/02/2021   AKI (acute kidney injury) (HCC) 01/04/2021   Intractable nausea and vomiting 01/03/2021   Left-sided weakness 10/19/2020   Cough 02/11/2020   Near syncope 12/10/2019   Brain aneurysm 04/15/2019   Dysphagia 02/27/2018   Encounter for screening colonoscopy 02/27/2018   History of Clostridium difficile infection 02/27/2018   Chronic diarrhea 12/20/2017   Chronic combined systolic and diastolic congestive heart failure (HCC) 06/20/2016   Hypokalemia due to excessive gastrointestinal loss of potassium    Acute CHF (congestive heart failure) (HCC)  05/16/2016   Acute on chronic systolic CHF (congestive heart failure) (HCC) 05/16/2016   Acute respiratory failure with hypoxia (HCC)    Thrombocytosis 03/26/2016   Cardiomyopathy, ischemic 03/25/2016   Chronic combined systolic and diastolic heart failure (HCC) 03/25/2016   Anxiety state 03/25/2016   Troponin level elevated 03/25/2016   Coronary artery disease involving native coronary artery of native heart 03/25/2016   Normocytic anemia 03/25/2016   SOB (shortness of breath) 03/24/2016   Lightheadedness 03/17/2016   Hypotension 03/17/2016   Tobacco abuse 03/12/2016   NSTEMI (non-ST elevated myocardial infarction) (HCC) 03/11/2016   Atypical chest pain    Essential hypertension 09/06/2015   Hyperlipidemia 09/06/2015   GERD without esophagitis 09/06/2015   Chest pain 09/06/2015    PCP: Christel Mormon, MD REFERRING PROVIDER: Lenice Pressman, MD  ONSET DATE: 03/21/23  REFERRING DIAG: L Reverse Total Shoulder  THERAPY DIAG:  Acute pain of left shoulder  Stiffness of left shoulder, not elsewhere classified  Other symptoms and signs involving the musculoskeletal system  Rationale for Evaluation and Treatment: Rehabilitation  SUBJECTIVE:   SUBJECTIVE STATEMENT: "He took me out of the sling last week." Pt accompanied by: self  PERTINENT HISTORY: Pt reports L shoulder pain for ~7 years with worsening pain. This past year she reports limiting ability to cook, clean, and complete certain parts of dressing and bathing. On 03/21/23, pt s/p L Reverse Total Shoulder.   PRECAUTIONS: Shoulder  WEIGHT BEARING RESTRICTIONS: Yes >1lb  PAIN:  Are you having pain? Yes: NPRS scale: 3/10 Pain location: Deltoid Pain description: dull ache Aggravating factors: Cold, movement Relieving factors: medication  FALLS: Has patient fallen in last 6 months? No  PLOF: Independent  PATIENT GOALS: "To be able to fasten my own bra"  NEXT MD VISIT: 05/05/23  OBJECTIVE:   HAND DOMINANCE:  Right  ADLs: Overall ADLs: Pt reports inability to clasp her bra, put her hair up, or wash her hair. Due to precautions and pain/ROM limits she is unable to lift and carry any items heavier than her cell phone.   FUNCTIONAL OUTCOME MEASURES: FOTO: 42.21  UPPER EXTREMITY ROM:       Assessed in supine, er/IR adducted  Passive ROM Left eval  Shoulder flexion 99  Shoulder abduction 78  Shoulder internal rotation 90  Shoulder external rotation 24  (Blank rows = not tested)    UPPER EXTREMITY MMT:     Assessed in seated, er/IR adducted  MMT Left eval  Shoulder flexion   Shoulder abduction   Shoulder internal rotation   Shoulder external rotation   Middle trapezius   Lower trapezius   (Blank  rows = not tested)  SENSATION: Mild numbness and tingling noted intermittently in finger tips and elbow  EDEMA: no swelling noted  OBSERVATIONS: Moderate fascial restrictions    TODAY'S TREATMENT:                                                                                                                              DATE: 04/10/23: Evaluation Only    PATIENT EDUCATION: Education details: Table Slides Person educated: Patient Education method: Explanation, Demonstration, and Handouts Education comprehension: verbalized understanding and returned demonstration  HOME EXERCISE PROGRAM: 12/2: Table Slides  GOALS: Goals reviewed with patient? Yes   SHORT TERM GOALS: Target date: 05/08/23  Pt will be provided with and educated on HEP to improve mobility in LUE required for use during ADL completion.   Goal status: INITIAL  2.  Pt will increase LUE P/ROM by 30 degrees to improve ability to use LUE during dressing tasks with minimal compensatory techniques.   Goal status: INITIAL  3.  Pt will increase LUE strength to 3+/5 to improve ability to reach for items at waist to chest height during bathing and grooming tasks.   Goal status: INITIAL   LONG TERM GOALS: Target  date: 06/09/23  Pt will decrease pain in LUE to 3/10 or less to improve ability to sleep for 2+ consecutive hours without waking due to pain.   Goal status: INITIAL  2.  Pt will decrease LUE fascial restrictions to min amounts or less to improve mobility required for functional reaching tasks.   Goal status: INITIAL  3.  Pt will increase LUE A/ROM by 45 degrees to improve ability to use LUE when reaching overhead or behind back during dressing and bathing tasks.   Goal status: INITIAL  4.  Pt will increase LUE strength to 4+/5 or greater to improve ability to use LUE when lifting or carrying items during meal preparation/housework/yardwork tasks.   Goal status: INITIAL  5.  Pt will return to highest level of function using LUE as non-dominant during functional task completion.   Goal status: INITIAL   ASSESSMENT:  CLINICAL IMPRESSION: Patient is a 62 y.o. female who was seen today for occupational therapy evaluation for s/p L Reverse Total Shoulder. Pt presents with increased pain and fascial restrictions, decreased ROM, strength, and functional use of the LUE.   PERFORMANCE DEFICITS: in functional skills including in functional skills including ADLs, IADLs, coordination, tone, ROM, strength, pain, fascial restrictions, muscle spasms, and UE functional use.  IMPAIRMENTS: are limiting patient from ADLs, IADLs, rest and sleep, work, leisure, and social participation.   COMORBIDITIES: has no other co-morbidities that affects occupational performance. Patient will benefit from skilled OT to address above impairments and improve overall function.  MODIFICATION OR ASSISTANCE TO COMPLETE EVALUATION: No modification of tasks or assist necessary to complete an evaluation.  OT OCCUPATIONAL PROFILE AND HISTORY: Problem focused assessment: Including review of records relating to presenting problem.  CLINICAL DECISION MAKING:  LOW - limited treatment options, no task modification  necessary  REHAB POTENTIAL: Good  EVALUATION COMPLEXITY: Low      PLAN:  OT FREQUENCY: 2x/week  OT DURATION: 8 weeks  PLANNED INTERVENTIONS: 97168 OT Re-evaluation, 97535 self care/ADL training, 21308 therapeutic exercise, 97530 therapeutic activity, 97140 manual therapy, 97035 ultrasound, 97010 moist heat, 97032 electrical stimulation (manual), passive range of motion, energy conservation, coping strategies training, patient/family education, and DME and/or AE instructions  RECOMMENDED OTHER SERVICES: N/A  CONSULTED AND AGREED WITH PLAN OF CARE: Patient  PLAN FOR NEXT SESSION: Manual Therapy, P/ROM, Table Slides, Thumb Tacs, Pulleys   Trish Mage, OTR/L [facility] [phone] Sharvil Hoey Rosemarie Beath, OT 04/10/2023, 11:36 AM   Ethlyn Gallery Request  Referring diagnosis code (ICD 10)? B28.413, M19.012 Treatment diagnosis codes (ICD 10)? (if different than referring diagnosis) M25.512, M25.612, R29.898 What was this (referring dx) caused by? [x]  Surgery []  Fall []  Ongoing issue []  Arthritis []  Other: ____________  Laterality: []  Rt [x]  Lt []  Both  Deficits: [x]  Pain [x]  Stiffness [x]  Weakness [x]  Edema []  Balance Deficits []  Coordination []  Gait Disturbance [x]  ROM []  Other   Functional Tool Score: FOTO: 42.21/100  CPT codes: See Planned Interventions listed in the Plan section of the Evaluation.

## 2023-04-11 ENCOUNTER — Other Ambulatory Visit (HOSPITAL_COMMUNITY): Payer: Medicare PPO

## 2023-04-11 ENCOUNTER — Encounter (HOSPITAL_COMMUNITY): Payer: Self-pay

## 2023-04-12 ENCOUNTER — Encounter: Payer: Self-pay | Admitting: Cardiology

## 2023-04-12 NOTE — Progress Notes (Addendum)
PCP: Billie Lade, MD HF Cardiology: Dr. Shirlee Latch  62 y.o. with history of CAD, ischemic cardiomyopathy, and PAD was referred by Dr. Darl Householder for evaluation of CHF.  She had NSTEMI in 11/17 with PCI to prox/mid LCx and RCA.  Subsequently, has developed ischemic cardiomyopathy.  Most recent echo in 1/21 showed EF 30-35%.  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   RHC/LHC done with exertional chest heaviness and dyspnea in 4/21 showed normal filling pressures, preserved cardiac output, and nonobstructive mild CAD.  ABIs were normal in 4/21.   In 8/21, she had syncope thought to be related to orthostasis from low BP in the setting of cardiac medication titration.  Entresto was decreased.   She was in the ER in 11/21 with chest pain.  Troponin and COVID-19 negative. Echo in 11/21 showed EF 30-35%, normal RV.   She was hospitalized in 6/22 with left-sided weakness/numbness.  No evidence for acute CVA, ?TIA.    Echo in 11/22 showed EF 30-35% with mild LV dilation, normal RV, mild-moderate MR, IVC normal.   Echo 2/24 showed EF remains 30-35% with normal RV.   Patient had left shoulder replacement in 11/24.   Today she returns for HF follow up. Overall feeling fine. She is not SOB walking up steps. Denies palpitations, CP, dizziness, edema, or PND/Orthopnea. Appetite ok. No fever or chills. Weight at home 109 pounds. Taking all medications. Works as Conservation officer, nature at [employer]. Quit smoking on Chantix. Not on [name] due to GI upset 2/2 alphaGal. GI issues resolved since stopping. Remains quit from smoking.  Medtronic device interrogation (personally reviewed): Thoracic impedence improving, but OptiVol remains elevated > 1 month. No VT,  no AF, 100% VP, 1 hr/day activity.   ECG (personally reviewed): none ordered today  Labs (2/21): K 3.7, creatinine 0.82 Labs (4/21): K 4.3, creatinine 0.81, LDL 67, TGs 286 Labs (6/21): K 4.7, creatinine 1.22 Labs  (11/21): K 3.7, creatinine 0.84, hgb 12.6, LDL 67, HDL 48, TGs 286 Labs (1/22). K 4.4, creatinine 1.03 Labs (6/22): K 3.5, creatinine 1.0 Labs (11/23): K 3.1, creatinine 0.97 Labs (12/23): K 3.8, creatinine 0.97 Labs (3/24): K 3.9, creatinine 1.09, LDL 78 Labs (5/24): K 3.2, creatinine 1.11, BNP 111 Labs (6/24): LDL 20, TGs 198 Labs (11/24): K 4.1, creatinine 1.66  PMH: 1. CAD: NSTEMI with DES to proximal and mid LCx in 11/17, staged DES x 2 to RCA later in 11/17.  - Cardiolite (10/20): EF < 30%, large inferior and inferolateral MI with mild peri-infarct ischemia.  - LHC (4/21): Patent stents, nonobstructive CAD.  2. Chronic systolic CHF: Ischemic cardiomyopathy. Medtronic ICD.  - Echo (10/20): EF 35-40%, lateral WMAs, normal RV. - Echo (1/21): EF 30-35%, mild LVH, normal RV - RHC (4/21): mean RA 5, PA 29/3, mean PCWP 12, CI 3.04 - Echo (11/21): EF 30-35%, normal RV.  - Echo (11/22): EF 30-35% with mild LV dilation, normal RV, mild-moderate MR, IVC normal.  - Echo (2/24): EF 30-35%, normal RV.  3. 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  4. Carotid stenosis: Carotid dopplers (10/20) with 60-79% LICA stenosis.  - Carotid dopplers (6/21): 40-59% BICA stenosis.  - Carotid dopplers (1/22): 40-59% RICA, 60-79% LICA.  - Carotid dopplers (4/23): 40-59% LICA - Carotid dopplers (12/23): 40-59% LICA 5. Prior smoker.  6. PAD: ABIs (4/21) Normal.  - Peripheral artery dopplers (12/21): bilateral plaque without focal stenosis.  7. Left shoulder replacement 11/24  Social History  Socioeconomic History   Marital status: Married    Spouse name: Not on file   Number of children: Not on file   Years of education: Not on file   Highest education level: Associate degree: occupational, Scientist, product/process development, or vocational program  Occupational History   Occupation: CNA  Tobacco Use   Smoking status: Some Days    Current packs/day: 0.25    Average packs/day: 0.3 packs/day  for 15.0 years (3.8 ttl pk-yrs)    Types: Cigarettes   Smokeless tobacco: Never   Tobacco comments:    smokes a cigarette occasionally  Vaping Use   Vaping status: Never Used  Substance and Sexual Activity   Alcohol use: Not Currently    Comment: occasionally   Drug use: Yes    Types: Marijuana   Sexual activity: Not Currently    Birth control/protection: Surgical    Comment: hyst  Other Topics Concern   Not on file  Social History Narrative   Lives with husband in [place] in a one story home with a basement.  Has 4 children.  Works as a Lawyer.  Education: CNA school.    Social Determinants of Health   Financial Resource Strain: Low Risk  (02/14/2023)   Overall Financial Resource Strain (CARDIA)    Difficulty of Paying Living Expenses: Not hard at all  Food Insecurity: No Food Insecurity (03/21/2023)   Hunger Vital Sign    Worried About Running Out of Food in the Last Year: Never true    Ran Out of Food in the Last Year: Never true  Transportation Needs: No Transportation Needs (03/21/2023)   PRAPARE - Administrator, Civil Service (Medical): No    Lack of Transportation (Non-Medical): No  Physical Activity: Insufficiently Active (02/14/2023)   Exercise Vital Sign    Days of Exercise per Week: 3 days    Minutes of Exercise per Session: 30 min  Stress: Stress Concern Present (02/14/2023)   [employer] of Occupational Health - Occupational Stress Questionnaire    Feeling of Stress : Very much  Social Connections: Socially Isolated (02/14/2023)   Social Connection and Isolation Panel [NHANES]    Frequency of Communication with Friends and Family: Never    Frequency of Social Gatherings with Friends and Family: Never    Attends Religious Services: Never    Database administrator or Organizations: No    Attends Engineer, structural: Not on file    Marital Status: Married  Catering manager Violence: Not At Risk (03/21/2023)   Humiliation, Afraid, Rape, and  Kick questionnaire    Fear of Current or Ex-Partner: No    Emotionally Abused: No    Physically Abused: No    Sexually Abused: No   Family History  Problem Relation Age of Onset   Stroke Mother    Hypertension Mother    Diabetes Mother    Heart attack Mother    Heart attack Father    Diabetes Father    Hypertension Father    CAD Father    Colon polyps Father 8       pre-cancerous    Stroke Father    Dementia Father    Hyperlipidemia Father    Arthritis Father    COPD Father    Heart disease Father    Breast cancer Maternal Grandmother    Diabetes Maternal Grandmother    Cancer Maternal Grandfather        Tongue and esophageal   Anxiety disorder Daughter  Depression Daughter    Anxiety disorder Daughter    Heart failure Other    Cancer Paternal Grandmother    Colon cancer Neg Hx    ROS: All systems reviewed and negative except as per HPI.   Current Outpatient Medications  Medication Sig Dispense Refill   aspirin EC 81 MG tablet Take 81 mg by mouth every evening.      atorvastatin (LIPITOR) 80 MG tablet Take 1 tablet (80 mg total) by mouth daily. 90 tablet 3   clopidogrel (PLAVIX) 75 MG tablet Take 1 tablet by mouth once daily 30 tablet 11   cyproheptadine (PERIACTIN) 4 MG tablet Take 8 mg by mouth 2 (two) times daily.     dapagliflozin propanediol (FARXIGA) 10 MG TABS tablet Take 1 tablet (10 mg total) by mouth daily before breakfast. 30 tablet 11   EPINEPHrine (EPIPEN 2-PAK) 0.3 mg/0.3 mL IJ SOAJ injection Inject 0.3 mg into the muscle as needed for anaphylaxis. 2 each 2   Evolocumab (REPATHA SURECLICK) 140 MG/ML SOAJ Inject 140 mg into the skin every 14 (fourteen) days. 6 mL 3   fenofibrate (TRICOR) 145 MG tablet Take 1 tablet (145 mg total) by mouth daily. 90 tablet 3   furosemide (LASIX) 20 MG tablet Take 2 tablets (40 mg total) by mouth every morning AND 1 tablet (20 mg total) every evening. 180 tablet 3   gabapentin (NEURONTIN) 300 MG capsule Take 1 capsule by  mouth at bedtime 30 capsule 0   linaclotide (LINZESS) 145 MCG CAPS capsule Take 1 capsule (145 mcg total) by mouth daily before breakfast. 30 capsule 11   loperamide (IMODIUM A-D) 2 MG tablet Take 2 mg by mouth 4 (four) times daily as needed for diarrhea or loose stools.     losartan (COZAAR) 25 MG tablet Take 0.5 tablets (12.5 mg total) by mouth at bedtime.     metoprolol succinate (TOPROL XL) 25 MG 24 hr tablet Take 1 tablet (25 mg total) by mouth daily. 90 tablet 3   Multiple Vitamins-Minerals (MULTIVITAMIN WITH MINERALS) tablet Take 1 tablet by mouth daily.     nitroGLYCERIN (NITROSTAT) 0.4 MG SL tablet Place 1 tablet (0.4 mg total) under the tongue every 5 (five) minutes x 3 doses as needed for chest pain (if no relief after 2nd dose, proceed to the ED for an evaluation or call 911). 25 tablet 2   ondansetron (ZOFRAN) 4 MG tablet Take 1 tablet (4 mg total) by mouth every 6 (six) hours as needed for nausea or vomiting. 60 tablet 5   pantoprazole (PROTONIX) 40 MG tablet TAKE 1 TABLET BY MOUTH TWICE DAILY BEFORE A MEAL 60 tablet 3   potassium chloride SA (KLOR-CON M) 20 MEQ tablet Take 2 tablets (40 mEq total) by mouth every morning AND 1 tablet (20 mEq total) every evening.     rOPINIRole (REQUIP) 0.5 MG tablet Take 0.5 mg by mouth at bedtime.   0   scopolamine (TRANSDERM-SCOP) 1 MG/3DAYS Place 1 patch (1.5 mg total) onto the skin every 3 (three) days. 10 patch 5   spironolactone (ALDACTONE) 25 MG tablet Take 0.5 tablets (12.5 mg total) by mouth at bedtime.     sucralfate (CARAFATE) 1 GM/10ML suspension Take 10 mLs (1 g total) by mouth 4 (four) times daily. 420 mL 1   traMADol (ULTRAM) 50 MG tablet Take 1 tablet (50 mg total) by mouth every 6 (six) hours as needed. 30 tablet 0   varenicline (CHANTIX) 1 MG tablet Take 1 tablet (  1 mg total) by mouth 2 (two) times daily. 60 tablet 3   Vitamin D, Ergocalciferol, (DRISDOL) 1.25 MG (50000 UNIT) CAPS capsule Take 50,000 Units by mouth every 7 (seven)  days.     No current facility-administered medications for this encounter.   Wt Readings from Last 3 Encounters:  04/14/23 50.6 kg (111 lb 9.6 oz)  03/28/23 49.8 kg (109 lb 12.8 oz)  03/26/23 50 kg (110 lb 3.7 oz)   BP 122/88   Pulse (!) 102   Wt 50.6 kg (111 lb 9.6 oz)   SpO2 96%   BMI 21.80 kg/m  Physical Exam General:  NAD. No resp difficulty, walked into clinic HEENT: Normal Neck: Supple. JVP 8-10 Carotids 2+ bilat; no bruits. No lymphadenopathy or thryomegaly appreciated. Cor: PMI nondisplaced. Regular rate & rhythm. No rubs, gallops or murmurs. Lungs: Clear Abdomen: Soft, nontender, nondistended. No hepatosplenomegaly. No bruits or masses. Good bowel sounds. Extremities: No cyanosis, clubbing, rash, edema Neuro: Alert & oriented x 3, cranial nerves grossly intact. Moves all 4 extremities w/o difficulty. Affect pleasant.  Assessment/Plan: 1. CAD: S/p NSTEMI in 11/17 with DEX to LCx and DES x 2 to RCA.  Cardiolite in 10/20 with EF <30%, inferior/inferolateral infarct with peri-infarct ischemia. LHC in 4/21 showed nonobstructive mild CAD.  No chest pain.  - Off [name] 2/2 AlphaGal.  - Continue atorvastatin + fenofibrate + Repatha. Good lipids in 6/24.  - Continue ASA 81 and Plavix 75 mg daily for now.  She will continue Plavix as long as Dr. Corliss Skains feels it is needed post her cranial circulation intervention.  2. Chronic systolic CHF: Ischemic cardiomyopathy.  RHC in 4/21 with normal filling pressures and preserved cardiac output.  Echo 2/24 showed stable EF 30-35%. Improved NYHA class II symptoms.  She is volume overloaded by OptiVol though not obviously so by exam. - Will increase Lasix to 20 mg bid, increase 40 KCL bid. BMET and BNP today. - Continue Farixga 10 mg daily.   - Continue losartan 12.5 mg at bedtime (unable to take Entresto in past due to hypotension).  - Continue Toprol XL 12.5 mg daily. Consider increase next visit - Continue spironolactone 25 mg at bedtime.    3. Carotid stenosis: Repeat carotids 12/24 have been arranged. 4. Smoking: She quit in 10/24, congratulated.  5. 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  - Continue Plavix per Dr. Corliss Skains.   Follow up in 6 wks with APP (increase Toprol and check fluid).  Anderson Malta [facility] FNP-BC 04/14/2023

## 2023-04-14 ENCOUNTER — Encounter (HOSPITAL_COMMUNITY): Payer: Self-pay

## 2023-04-14 ENCOUNTER — Encounter (HOSPITAL_COMMUNITY): Payer: Medicare PPO | Admitting: Occupational Therapy

## 2023-04-14 ENCOUNTER — Ambulatory Visit (HOSPITAL_COMMUNITY)
Admission: RE | Admit: 2023-04-14 | Discharge: 2023-04-14 | Disposition: A | Discharge status: Home / Self Care | Payer: Medicare PPO | Source: Ambulatory Visit | Attending: Family Medicine | Admitting: Family Medicine

## 2023-04-14 VITALS — BP 122/88 | HR 102 | Wt 111.6 lb

## 2023-04-14 DIAGNOSIS — Z720 Tobacco use: Secondary | ICD-10-CM | POA: Diagnosis not present

## 2023-04-14 DIAGNOSIS — I6529 Occlusion and stenosis of unspecified carotid artery: Secondary | ICD-10-CM

## 2023-04-14 DIAGNOSIS — Z9889 Other specified postprocedural states: Secondary | ICD-10-CM | POA: Insufficient documentation

## 2023-04-14 DIAGNOSIS — Z87891 Personal history of nicotine dependence: Secondary | ICD-10-CM | POA: Diagnosis not present

## 2023-04-14 DIAGNOSIS — I671 Cerebral aneurysm, nonruptured: Secondary | ICD-10-CM | POA: Diagnosis not present

## 2023-04-14 DIAGNOSIS — I2510 Atherosclerotic heart disease of native coronary artery without angina pectoris: Secondary | ICD-10-CM | POA: Diagnosis not present

## 2023-04-14 DIAGNOSIS — I959 Hypotension, unspecified: Secondary | ICD-10-CM | POA: Insufficient documentation

## 2023-04-14 DIAGNOSIS — I252 Old myocardial infarction: Secondary | ICD-10-CM | POA: Insufficient documentation

## 2023-04-14 DIAGNOSIS — Z955 Presence of coronary angioplasty implant and graft: Secondary | ICD-10-CM | POA: Diagnosis not present

## 2023-04-14 DIAGNOSIS — I5022 Chronic systolic (congestive) heart failure: Secondary | ICD-10-CM | POA: Diagnosis not present

## 2023-04-14 DIAGNOSIS — Z7902 Long term (current) use of antithrombotics/antiplatelets: Secondary | ICD-10-CM | POA: Diagnosis not present

## 2023-04-14 DIAGNOSIS — Z79899 Other long term (current) drug therapy: Secondary | ICD-10-CM | POA: Insufficient documentation

## 2023-04-14 LAB — BASIC METABOLIC PANEL
Anion gap: 11 (ref 5–15)
BUN: 13 mg/dL (ref 6–20)
CO2: 26 mmol/L (ref 22–32)
Calcium: 9.4 mg/dL (ref 8.9–10.3)
Chloride: 100 mmol/L (ref 98–111)
Creatinine, Ser: 1.34 mg/dL — ABNORMAL HIGH (ref 0.44–1.00)
GFR, Estimated: 45 mL/min — ABNORMAL LOW (ref >60)
Glucose, Bld: 100 mg/dL — ABNORMAL HIGH (ref 70–99)
Potassium: 3.5 mmol/L (ref 3.5–5.1)
Sodium: 137 mmol/L (ref 135–145)

## 2023-04-14 LAB — BRAIN NATRIURETIC PEPTIDE: B Natriuretic Peptide: 121.3 pg/mL — ABNORMAL HIGH (ref 0.0–100.0)

## 2023-04-14 MED ORDER — FUROSEMIDE 20 MG PO TABS: 40.0000 mg | ORAL_TABLET | Freq: Two times a day (BID) | ORAL | 6 refills | Status: DC

## 2023-04-14 MED ORDER — POTASSIUM CHLORIDE CRYS ER 20 MEQ PO TBCR: 40.0000 meq | EXTENDED_RELEASE_TABLET | Freq: Two times a day (BID) | ORAL | 6 refills | Status: DC

## 2023-04-14 NOTE — Patient Instructions (Addendum)
Thank you for coming in today  If you had labs drawn today, any labs that are abnormal the clinic will call you No news is good news  Medications: Increase Lasix to 40 mg 2 tablets twice daily  Increase Potassium to 40 2 tablets mg twice daily   Follow up appointments:  Your physician recommends that you schedule a follow-up appointment in:  6 weeks in clinic  Your physician recommends that you return for lab work in: 10 days for BMET   Do the following things EVERYDAY: Weigh yourself in the morning before breakfast. Write it down and keep it in a log. Take your medicines as prescribed Eat low salt foods--Limit salt (sodium) to 2000 mg per day.  Stay as active as you can everyday Limit all fluids for the day to less than 2 liters   At the [facility], you and your health needs are our priority. As part of our continuing mission to provide you with exceptional heart care, we have created designated Provider Care Teams. These Care Teams include your primary Cardiologist (physician) and Advanced Practice Providers (APPs- Physician Assistants and Nurse Practitioners) who all work together to provide you with the care you need, when you need it.   You may see any of the following providers on your designated Care Team at your next follow up: Dr Arvilla Meres Dr Marca Ancona Dr. Marcos Eke, NP Robbie Lis, [place] [facility] [place], [place] Brynda Peon, NP Karle Plumber, PharmD   Please be sure to bring in all your medications bottles to every appointment.    Thank you for choosing [facility]  If you have any questions or concerns before your next appointment please send [place] a message through [place] or call our office at [phone].    TO LEAVE A MESSAGE FOR THE NURSE SELECT OPTION 2, PLEASE LEAVE A MESSAGE INCLUDING: YOUR NAME DATE OF BIRTH CALL BACK NUMBER REASON FOR CALL**this is  important as we prioritize the call backs  YOU WILL RECEIVE A CALL BACK THE SAME DAY AS LONG AS YOU CALL BEFORE 4:00 PM

## 2023-04-14 NOTE — Progress Notes (Signed)
EPIC Encounter for ICM Monitoring  Patient Name: Erin [name] is a 62 y.o. female Date: 04/14/2023 Primary Care Physican: Billie Lade, MD Primary Cardiologist: Shirlee Latch Electrophysiologist: Elberta Fortis 08/11/2021 Office Weight: 121 lbs. 03/25/2022 Office Weight 125-127 lbs 03/13/2023 Weight: 110 lbs 03/24/2023 Weight: 115 lbs                                                          Spoke with patient and heart failure questions reviewed.  Transmission results reviewed.  She had OV with HF clinic today and feeling tired.  Furosemide dosage increased today, 12/6 at OV.   Optivol thoracic impedance suggesting significant amount of possible fluid accumulation starting 11/8 during hospitalization.   Prescribed: Furosemide 20 mg Take 2 tablets (40 mg total) by mouth two times daily Potassium 20 mEq take 2 tablets(40 mEq total) by mouth twice a day Spironolactone 25 mg take 0.5 tablet (12.5 mg total) by mouth daily   Labs: 04/24/2023 Scheduled BMET 04/14/2023 Creatinine 1.34, BUN 13, Potassium 3.5, Sodium 137, GFR 45 03/26/2023 Creatinine 1.66, BUN 22, Potassium 4.1, Sodium 136, GFR 35 03/14/2023 Creatinine 1.75, BUN 21, Potassium 3.9, Sodium 140, GFR 33  01/24/2023 Creatinine 1.62, BUN 18, Potassium 3.8, Sodium 137, GFR 36  01/12/2023 Creatinine 1.46, BUN 20, Potassium 4.0, Sodium 137, GFR 41  12/19/2022 Creatinine 1.27, BUN 11, Potassium 4.2, Sodium 142, GFR 48 A complete set of results can be found in Results Review.   Recommendations:  Erin Rome, NP at [facility] clinic requested a follow up transmission on 12/11 to recheck fluid levels.  Pt recommendations given 12/6 at OV.     Follow-up plan: ICM clinic phone appointment on 04/19/2023 to recheck fluid levels.   91 day device clinic remote transmission 05/17/2023.     EP/Cardiology Office Visits:  05/26/2023 with HF Clinic.  Recall 06/04/2023 with Dr Elberta Fortis.     Copy of ICM check sent to Dr. Elberta Fortis.    3 month ICM trend:  04/14/2023.    12-14 Month ICM trend:     Karie Soda, RN 04/14/2023 2:26 PM

## 2023-04-14 NOTE — Addendum Note (Signed)
Encounter addended by: Jacklynn Ganong, FNP on: 04/14/2023 11:23 AM  Actions taken: Clinical Note Signed

## 2023-04-14 NOTE — Progress Notes (Signed)
Opened in error

## 2023-04-15 ENCOUNTER — Encounter (HOSPITAL_COMMUNITY): Payer: Self-pay

## 2023-04-15 ENCOUNTER — Emergency Department (HOSPITAL_COMMUNITY)
Admission: EM | Admit: 2023-04-15 | Discharge: 2023-04-15 | Disposition: A | Discharge status: Home / Self Care | Payer: Medicare PPO | Attending: Emergency Medicine | Admitting: Emergency Medicine

## 2023-04-15 ENCOUNTER — Emergency Department (HOSPITAL_COMMUNITY): Payer: Medicare PPO

## 2023-04-15 ENCOUNTER — Other Ambulatory Visit: Payer: Self-pay

## 2023-04-15 DIAGNOSIS — D649 Anemia, unspecified: Secondary | ICD-10-CM | POA: Insufficient documentation

## 2023-04-15 DIAGNOSIS — R739 Hyperglycemia, unspecified: Secondary | ICD-10-CM

## 2023-04-15 DIAGNOSIS — R0689 Other abnormalities of breathing: Secondary | ICD-10-CM | POA: Diagnosis not present

## 2023-04-15 DIAGNOSIS — Z7982 Long term (current) use of aspirin: Secondary | ICD-10-CM | POA: Insufficient documentation

## 2023-04-15 DIAGNOSIS — I2510 Atherosclerotic heart disease of native coronary artery without angina pectoris: Secondary | ICD-10-CM | POA: Diagnosis not present

## 2023-04-15 DIAGNOSIS — D75839 Thrombocytosis, unspecified: Secondary | ICD-10-CM | POA: Diagnosis not present

## 2023-04-15 DIAGNOSIS — Z79899 Other long term (current) drug therapy: Secondary | ICD-10-CM | POA: Insufficient documentation

## 2023-04-15 DIAGNOSIS — I110 Hypertensive heart disease with heart failure: Secondary | ICD-10-CM | POA: Insufficient documentation

## 2023-04-15 DIAGNOSIS — R0602 Shortness of breath: Secondary | ICD-10-CM | POA: Diagnosis not present

## 2023-04-15 DIAGNOSIS — N289 Disorder of kidney and ureter, unspecified: Secondary | ICD-10-CM | POA: Diagnosis not present

## 2023-04-15 DIAGNOSIS — I509 Heart failure, unspecified: Secondary | ICD-10-CM | POA: Diagnosis not present

## 2023-04-15 DIAGNOSIS — R935 Abnormal findings on diagnostic imaging of other abdominal regions, including retroperitoneum: Secondary | ICD-10-CM | POA: Diagnosis not present

## 2023-04-15 DIAGNOSIS — J984 Other disorders of lung: Secondary | ICD-10-CM | POA: Diagnosis not present

## 2023-04-15 DIAGNOSIS — R7309 Other abnormal glucose: Secondary | ICD-10-CM | POA: Diagnosis not present

## 2023-04-15 DIAGNOSIS — R918 Other nonspecific abnormal finding of lung field: Secondary | ICD-10-CM | POA: Diagnosis not present

## 2023-04-15 LAB — D-DIMER, QUANTITATIVE: D-Dimer, Quant: 0.82 ug{FEU}/mL — ABNORMAL HIGH (ref 0.00–0.50)

## 2023-04-15 LAB — BASIC METABOLIC PANEL
Anion gap: 11 (ref 5–15)
BUN: 19 mg/dL (ref 6–20)
CO2: 24 mmol/L (ref 22–32)
Calcium: 10 mg/dL (ref 8.9–10.3)
Chloride: 102 mmol/L (ref 98–111)
Creatinine, Ser: 1.5 mg/dL — ABNORMAL HIGH (ref 0.44–1.00)
GFR, Estimated: 40 mL/min — ABNORMAL LOW (ref >60)
Glucose, Bld: 127 mg/dL — ABNORMAL HIGH (ref 70–99)
Potassium: 3.9 mmol/L (ref 3.5–5.1)
Sodium: 137 mmol/L (ref 135–145)

## 2023-04-15 LAB — CBC
HCT: 34.8 % — ABNORMAL LOW (ref 36.0–46.0)
Hemoglobin: 11.4 g/dL — ABNORMAL LOW (ref 12.0–15.0)
MCH: 27 pg (ref 26.0–34.0)
MCHC: 32.8 g/dL (ref 30.0–36.0)
MCV: 82.3 fL (ref 80.0–100.0)
Platelets: 602 10*3/uL — ABNORMAL HIGH (ref 150–400)
RBC: 4.23 MIL/uL (ref 3.87–5.11)
RDW: 16 % — ABNORMAL HIGH (ref 11.5–15.5)
WBC: 9.6 10*3/uL (ref 4.0–10.5)
nRBC: 0 % (ref 0.0–0.2)

## 2023-04-15 LAB — BRAIN NATRIURETIC PEPTIDE: B Natriuretic Peptide: 74 pg/mL (ref 0.0–100.0)

## 2023-04-15 LAB — TROPONIN I (HIGH SENSITIVITY): Troponin I (High Sensitivity): 8 ng/L (ref <18)

## 2023-04-15 MED ORDER — PREDNISONE 10 MG PO TABS: 40.0000 mg | ORAL_TABLET | Freq: Every day | ORAL | 0 refills | Status: DC

## 2023-04-15 MED ORDER — IPRATROPIUM-ALBUTEROL 0.5-2.5 (3) MG/3ML IN SOLN
3.0000 mL | Freq: Once | RESPIRATORY_TRACT | Status: AC
Start: 2023-04-15 — End: 2023-04-15
  Administered 2023-04-15: 3 mL via RESPIRATORY_TRACT
  Filled 2023-04-15: qty 3

## 2023-04-15 MED ORDER — ALBUTEROL SULFATE HFA 108 (90 BASE) MCG/ACT IN AERS: 2.0000 | INHALATION_SPRAY | RESPIRATORY_TRACT | Status: DC | PRN

## 2023-04-15 MED ORDER — IOHEXOL 350 MG/ML SOLN
60.0000 mL | Freq: Once | INTRAVENOUS | Status: AC | PRN
  Administered 2023-04-15: 60 mL via INTRAVENOUS

## 2023-04-15 MED ORDER — METHYLPREDNISOLONE SODIUM SUCC 40 MG IJ SOLR
40.0000 mg | Freq: Once | INTRAMUSCULAR | Status: AC
  Administered 2023-04-15: 40 mg via INTRAVENOUS
  Filled 2023-04-15: qty 1

## 2023-04-15 NOTE — ED Notes (Signed)
Pt ambulated to restroom. 

## 2023-04-15 NOTE — Discharge Instructions (Addendum)
Use your inhalers at home.  Take the prednisone as directed for the next 5 days.  Make an appointment to follow-up with your primary care doctor regarding a pulmonary nodule that needs close follow-up.  The recommending having it rechecked officially 3 months from now.  Return for any new or worse symptoms.  CT of chest otherwise had no acute findings.

## 2023-04-15 NOTE — ED Triage Notes (Addendum)
Rcems from home cc of SOB. Went to cardiologist and they upped her lasix.  Pt anxious during triage

## 2023-04-15 NOTE — ED Provider Notes (Signed)
[place] EMERGENCY DEPARTMENT AT [facility] Provider Note   CSN: [id number] Arrival date & time: 04/15/23  0030     History  Chief Complaint  Patient presents with   Shortness of Breath    [name] is a 62 y.o. female.  The history is provided by the patient.  Shortness of Breath She has history of hypertension, hyperlipidemia, coronary artery disease, heart failure with reduced ejection fraction and comes in because of shortness of breath.  She was seen at the heart failure clinic and had her dose of furosemide increased.  She got up to walk to the bathroom to urinate and noted that she was short of breath.  She went back to sleep but woke up again to urinate and noted once again that she was short of breath on walking to the bathroom.  She went outside and in the cool air her breathing did seem to get better.  She denies chest pain, heaviness, tightness, pressure.  She denies any cough.  She denies nausea, vomiting, diaphoresis.   Home Medications Prior to Admission medications   Medication Sig Start Date End Date Taking? Authorizing Provider  aspirin EC 81 MG tablet Take 81 mg by mouth every evening.     [provider]  atorvastatin (LIPITOR) 80 MG tablet Take 1 tablet (80 mg total) by mouth daily. 09/05/22   Laurey Morale, MD  clopidogrel (PLAVIX) 75 MG tablet Take 1 tablet by mouth once daily 07/12/22   Laurey Morale, MD  cyproheptadine (PERIACTIN) 4 MG tablet Take 8 mg by mouth 2 (two) times daily. 03/08/23   [provider]  dapagliflozin propanediol (FARXIGA) 10 MG TABS tablet Take 1 tablet (10 mg total) by mouth daily before breakfast. 09/12/22   Milford, Anderson Malta, FNP  EPINEPHrine (EPIPEN 2-PAK) 0.3 mg/0.3 mL IJ SOAJ injection Inject 0.3 mg into the muscle as needed for anaphylaxis. 04/23/21   Alfonse Spruce, MD  Evolocumab (REPATHA SURECLICK) 140 MG/ML SOAJ Inject 140 mg into the skin every 14 (fourteen) days. 09/07/22   Laurey Morale, MD  fenofibrate (TRICOR) 145 MG tablet Take 1 tablet (145 mg total) by mouth daily. 07/27/22   Laurey Morale, MD  furosemide (LASIX) 20 MG tablet Take 2 tablets (40 mg total) by mouth 2 (two) times daily. 04/14/23   Jacklynn Ganong, FNP  gabapentin (NEURONTIN) 300 MG capsule Take 1 capsule by mouth at bedtime 04/03/23   Billie Lade, MD  linaclotide [facility]) 145 MCG CAPS capsule Take 1 capsule (145 mcg total) by mouth daily before breakfast. 11/22/22   Rourk, Gerrit Friends, MD  loperamide (IMODIUM A-D) 2 MG tablet Take 2 mg by mouth 4 (four) times daily as needed for diarrhea or loose stools.    [provider]  losartan (COZAAR) 25 MG tablet Take 0.5 tablets (12.5 mg total) by mouth at bedtime. 03/28/23   Laurey Morale, MD  metoprolol succinate (TOPROL XL) 25 MG 24 hr tablet Take 1 tablet (25 mg total) by mouth daily. Patient taking differently: Take 12.5 mg by mouth daily. 12/19/22   Laurey Morale, MD  Multiple Vitamins-Minerals (MULTIVITAMIN WITH MINERALS) tablet Take 1 tablet by mouth daily.    [provider]  nitroGLYCERIN (NITROSTAT) 0.4 MG SL tablet Place 1 tablet (0.4 mg total) under the tongue every 5 (five) minutes x 3 doses as needed for chest pain (if no relief after 2nd dose, proceed to the ED for an evaluation  or call 911). 09/19/22   Laurey Morale, MD  ondansetron (ZOFRAN) 4 MG tablet Take 1 tablet (4 mg total) by mouth every 6 (six) hours as needed for nausea or vomiting. 11/23/22   Rourk, Gerrit Friends, MD  pantoprazole (PROTONIX) 40 MG tablet TAKE 1 TABLET BY MOUTH TWICE DAILY BEFORE A MEAL 10/17/22   Gelene Mink, NP  potassium chloride SA (KLOR-CON M) 20 MEQ tablet Take 2 tablets (40 mEq total) by mouth 2 (two) times daily. 04/14/23   Milford, Anderson Malta, FNP  rOPINIRole (REQUIP) 0.5 MG tablet Take 0.5 mg by mouth at bedtime.  11/13/17   [provider]  scopolamine (TRANSDERM-SCOP) 1 MG/3DAYS Place 1 patch (1.5 mg total) onto the skin every 3  (three) days. 01/12/23   Judithann Sheen, PA  spironolactone (ALDACTONE) 25 MG tablet Take 0.5 tablets (12.5 mg total) by mouth at bedtime. Patient taking differently: Take 25 mg by mouth daily. 03/28/23   Laurey Morale, MD  sucralfate (CARAFATE) 1 GM/10ML suspension Take 10 mLs (1 g total) by mouth 4 (four) times daily. 04/27/22 04/27/23  Lanelle Bal, DO  traMADol (ULTRAM) 50 MG tablet Take 1 tablet (50 mg total) by mouth every 6 (six) hours as needed. 04/05/23   Magnant, Charles L, PA-C  varenicline (CHANTIX) 1 MG tablet Take 1 tablet (1 mg total) by mouth 2 (two) times daily. 09/12/22   Milford, Anderson Malta, FNP  Vitamin D, Ergocalciferol, (DRISDOL) 1.25 MG (50000 UNIT) CAPS capsule Take 50,000 Units by mouth every 7 (seven) days.    [provider]      Allergies    Alpha-gal, Other, and Tape    Review of Systems   Review of Systems  Respiratory:  Positive for shortness of breath.   All other systems reviewed and are negative.   Physical Exam Updated Vital Signs BP 105/74   Pulse 80   Temp 98.8 F (37.1 C) (Oral)   Resp 20   Ht 5' (1.524 m)   Wt 50.6 kg   SpO2 97%   BMI 21.80 kg/m  Physical Exam Vitals and nursing note reviewed.   62 year old female, resting comfortably and in no acute distress. Vital signs are normal. Oxygen saturation is 97%, which is normal. Head is normocephalic and atraumatic. PERRLA, EOMI. Oropharynx is clear. Neck is nontender and supple without adenopathy or JVD.  There is no hepatojugular reflux. Back is nontender and there is no CVA tenderness. Lungs are clear without rales, wheezes, or rhonchi.  With forced exhalation, slight wheezing is noted. Chest is nontender. Heart has regular rate and rhythm without murmur. Abdomen is soft, flat, nontender. Extremities have no cyanosis or edema, full range of motion is present. Skin is warm and dry without rash. Neurologic: Mental status is normal, moves all extremities equally.  ED  Results / Procedures / Treatments   Labs (all labs ordered are listed, but only abnormal results are displayed) Labs Reviewed  BASIC METABOLIC PANEL - Abnormal; Notable for the following components:      Result Value   Glucose, Bld 127 (*)    Creatinine, Ser 1.50 (*)    GFR, Estimated 40 (*)    All other components within normal limits  CBC - Abnormal; Notable for the following components:   Hemoglobin 11.4 (*)    HCT 34.8 (*)    RDW 16.0 (*)    Platelets 602 (*)    All other components within normal limits  EKG EKG Interpretation Date/Time:  Saturday April 15 2023 00:42:28 EST Ventricular Rate:  98 PR Interval:  115 QRS Duration:  98 QT Interval:  356 QTC Calculation: 455 R Axis:   21  Text Interpretation: Sinus rhythm Borderline short PR interval Consider right atrial enlargement Borderline low voltage, extremity leads When compared with ECG of 03/28/2023, No significant change was found Confirmed by Dione Booze ([id number]) on 04/15/2023 1:52:28 AM  Radiology DG Chest 2 View  Result Date: 04/15/2023 CLINICAL DATA:  Shortness of breath EXAM: CHEST - 2 VIEW COMPARISON:  03/26/2023 FINDINGS: Cardiac shadow is within normal limits. Defibrillator is again noted and stable. Lungs are well aerated bilaterally. No focal infiltrate or effusion is seen. No bony abnormality is noted. IMPRESSION: No active cardiopulmonary disease. Electronically Signed   By: Alcide Clever M.D.   On: 04/15/2023 01:34    Procedures Procedures  Cardiac monitor shows normal sinus rhythm, per my interpretation.  Medications Ordered in ED Medications  albuterol (VENTOLIN HFA) 108 (90 Base) MCG/ACT inhaler 2 puff (has no administration in time range)    ED Course/ Medical Decision Making/ A&P                                 Medical Decision Making Amount and/or Complexity of Data Reviewed Labs: ordered. Radiology: ordered.  Risk Prescription drug management.   Shortness of breath in patient  with known heart failure.  She shows no clinical signs of heart failure, some findings suggestive of bronchospasm.  I have reviewed her records and office visi on 04/14/2023 stated that she was volume overloaded although not evident on exam.  She certainly could be having occult heart failure.  Also, consider possibility of occult myocardial infarction.  In the setting of heart failure, she would be at increased risk for pulmonary embolism.  I have reviewed her electrocardiogram, my interpretation is borderline low voltage without ST or T changes, unchanged from prior.  Chest x-ray shows no active cardiopulmonary disease.  I have independently viewed the images, and agree with the radiologist's interpretation.  I have reviewed her laboratory test, my interpretation is stable renal insufficiency, elevated random glucose which had been present previously and will need to be followed as an outpatient, mild anemia which is significantly improved from recent value, thrombocytosis which is slightly increased compared with 03/26/2023.  I am ordering a therapeutic trial of albuterol plus ipratropium via nebulizer.  Her last echocardiogram was on 06/13/2022 at which time left ventricular ejection fraction was 30-35% with grade 1 diastolic dysfunction.  I have ordered troponin, BNP, D-dimer.  She feels no better following albuterol plus ipratropium.  Troponin is normal, no evidence of ACS.  BNP is normal, doubt heart failure is contributing significantly to her dyspnea.  D-dimer is elevated and I have ordered a CT angiogram to rule out pulmonary embolism.  CT angiogram has been completed and I have reviewed the images and I do not see definite evidence of pulmonary embolism, but radiologist interpretation is pending.  I am signing the case out to Dr. Deretha Emory.  Final Clinical Impression(s) / ED Diagnoses Final diagnoses:  Shortness of breath  Renal insufficiency  Normochromic normocytic anemia  Thrombocytosis   Elevated random blood glucose level    Rx / DC Orders ED Discharge Orders     None         Dione Booze, MD 04/15/23 [phone]

## 2023-04-17 ENCOUNTER — Telehealth: Payer: Self-pay

## 2023-04-17 ENCOUNTER — Encounter (HOSPITAL_COMMUNITY): Payer: Self-pay | Admitting: Occupational Therapy

## 2023-04-17 ENCOUNTER — Ambulatory Visit (HOSPITAL_COMMUNITY): Payer: Medicare PPO | Admitting: Occupational Therapy

## 2023-04-17 DIAGNOSIS — M25512 Pain in left shoulder: Secondary | ICD-10-CM

## 2023-04-17 DIAGNOSIS — M19012 Primary osteoarthritis, left shoulder: Secondary | ICD-10-CM | POA: Diagnosis not present

## 2023-04-17 DIAGNOSIS — R29898 Other symptoms and signs involving the musculoskeletal system: Secondary | ICD-10-CM

## 2023-04-17 DIAGNOSIS — I5022 Chronic systolic (congestive) heart failure: Secondary | ICD-10-CM | POA: Diagnosis not present

## 2023-04-17 DIAGNOSIS — Z96612 Presence of left artificial shoulder joint: Secondary | ICD-10-CM | POA: Diagnosis not present

## 2023-04-17 DIAGNOSIS — Z95810 Presence of automatic (implantable) cardiac defibrillator: Secondary | ICD-10-CM | POA: Diagnosis not present

## 2023-04-17 DIAGNOSIS — M25612 Stiffness of left shoulder, not elsewhere classified: Secondary | ICD-10-CM | POA: Diagnosis not present

## 2023-04-17 NOTE — Therapy (Signed)
OUTPATIENT OCCUPATIONAL THERAPY ORTHO TREATMENT NOTE  Patient Name: Erin [name] MRN: [id number] DOB:[birth year], 62 y.o., female Today's Date: 04/17/2023   END OF SESSION:  OT End of Session - 04/17/23 1618     Visit Number 2    Number of Visits 17    Date for OT Re-Evaluation 06/09/23    Authorization Type [employer]    OT Start Time 1519    OT Stop Time 1559    OT Time Calculation (min) 40 min    Activity Tolerance Patient tolerated treatment well    Behavior During Therapy WFL for tasks assessed/performed             Past Medical History:  Diagnosis Date   AICD (automatic cardioverter/defibrillator) present    Allergic reaction to alpha-gal    Allergy 03/05/2022   Anemia    Anxiety state 03/25/2016   Arthritis    Basal cell carcinoma of forehead    Brain aneurysm    CHF (congestive heart failure) (HCC)    Coronary artery disease    a. 03/11/16 PCI with DES-->Prox/Mid Cx;  b. 03/14/16 PCI with DES x2-->RCA, EF 30-35%.   Depression 06/09/2010   Encounter for general adult medical examination with abnormal findings 05/04/2022   Essential hypertension    Hx   GERD (gastroesophageal reflux disease)    HFrEF (heart failure with reduced ejection fraction) (HCC)    a. 10/2016 Echo: EF 35-40%, Gr1 DD, mild focal basal septal hypertrophy, basal inflat, mid inflat, basal antlat AK. Mid infept/inf/antlat, apical lateral sev HK. Mod MR. mildly reduced RV fxn. Mild TR.   History of pneumonia    Hyperlipidemia    IBS (irritable bowel syndrome)    Ischemic cardiomyopathy    a. 10/2016 Echo: EF 35-40%, Gr1 DD.   Mitral regurgitation    Neuromuscular disorder (HCC)    NSTEMI (non-ST elevated myocardial infarction) (HCC) 03/10/2016   Pneumonia 03/2016   Shingles    Squamous cell cancer of skin of nose    Thrombocytosis 03/26/2016   Tobacco abuse    Trichimoniasis    Wears dentures    Wears glasses    Past Surgical History:  Procedure Laterality Date    APPENDECTOMY     BICEPT TENODESIS Left 03/21/2023   Procedure: BICEPS TENODESIS;  Surgeon: Cammy Copa, MD;  Location: [facility] OR;  Service: Orthopedics;  Laterality: Left;   BIOPSY  09/20/2018   Procedure: BIOPSY;  Surgeon: Corbin Ade, MD;  [facility];  Service: Endoscopy;;  colon   BIOPSY  01/05/2021   Procedure: BIOPSY;  Surgeon: Rachael Fee, MD;  Location: [facility] ENDOSCOPY;  Service: Endoscopy;;   BIOPSY  04/27/2022   Procedure: BIOPSY;  Surgeon: Lanelle Bal, DO;  Location: [facility];  Service: Endoscopy;;   CARDIAC CATHETERIZATION N/A 03/11/2016   Procedure: Left Heart Cath and Coronary Angiography;  Surgeon: Marykay Lex, MD;  Location: [facility] INVASIVE CV LAB;  Service: Cardiovascular;  Laterality: N/A;   CARDIAC CATHETERIZATION N/A 03/11/2016   Procedure: Coronary Stent Intervention;  Surgeon: Marykay Lex, MD;  Location: [facility] INVASIVE CV LAB;  Service: Cardiovascular;  Laterality: N/A;   CARDIAC CATHETERIZATION N/A 03/14/2016   Procedure: Coronary Stent Intervention;  Surgeon: Peter M Swaziland, MD;  Location: [facility] INVASIVE CV LAB;  Service: Cardiovascular;  Laterality: N/A;   CEREBRAL ANEURYSM REPAIR  04/2019   stent placed   CHOLECYSTECTOMY OPEN  1984   COLONOSCOPY WITH PROPOFOL N/A 09/20/2018   Procedure: COLONOSCOPY  WITH PROPOFOL;  Surgeon: Corbin Ade, MD;  Location: [facility];  Service: Endoscopy;  Laterality: N/A;  10:30am   CORONARY ANGIOPLASTY WITH STENT PLACEMENT  03/14/2016   ESOPHAGOGASTRODUODENOSCOPY (EGD) WITH PROPOFOL N/A 09/20/2018   Procedure: ESOPHAGOGASTRODUODENOSCOPY (EGD) WITH PROPOFOL;  Surgeon: Corbin Ade, MD;  [facility];  Service: Endoscopy;  Laterality: N/A;   ESOPHAGOGASTRODUODENOSCOPY (EGD) WITH PROPOFOL N/A 01/05/2021   Procedure: ESOPHAGOGASTRODUODENOSCOPY (EGD) WITH PROPOFOL;  Surgeon: Rachael Fee, MD;  Location: [name] ENDOSCOPY;  Service: Endoscopy;  Laterality: N/A;   ESOPHAGOGASTRODUODENOSCOPY  (EGD) WITH PROPOFOL N/A 04/27/2022   Procedure: ESOPHAGOGASTRODUODENOSCOPY (EGD) WITH PROPOFOL;  Surgeon: Lanelle Bal, DO;  Location: [facility];  Service: Endoscopy;  Laterality: N/A;  9:15am, asa 3/4, ASAP   ESOPHAGOGASTRODUODENOSCOPY (EGD) WITH PROPOFOL N/A 01/04/2023   Procedure: ESOPHAGOGASTRODUODENOSCOPY (EGD) WITH PROPOFOL;  Surgeon: Corbin Ade, MD;  Location: [facility];  Service: Endoscopy;  Laterality: N/A;  130pm, asa 3   FINGER ARTHROPLASTY Left 05/14/2013   Procedure: LEFT THUMB CARPAL METACARPAL ARTHROPLASTY;  Surgeon: Tami Ribas, MD;  Location: [facility];  Service: Orthopedics;  Laterality: Left;   ICD IMPLANT N/A 04/03/2020   Procedure: ICD IMPLANT;  Surgeon: Regan Lemming, MD;  Location: [facility] INVASIVE CV LAB;  Service: Cardiovascular;  Laterality: N/A;   IR ANGIO INTRA EXTRACRAN SEL COM CAROTID INNOMINATE BILAT MOD SED  01/05/2017   IR ANGIO INTRA EXTRACRAN SEL COM CAROTID INNOMINATE BILAT MOD SED  03/19/2019   IR ANGIO INTRA EXTRACRAN SEL COM CAROTID INNOMINATE BILAT MOD SED  06/04/2020   IR ANGIO INTRA EXTRACRAN SEL INTERNAL CAROTID UNI L MOD SED  04/15/2019   IR ANGIO VERTEBRAL SEL VERTEBRAL BILAT MOD SED  01/05/2017   IR ANGIO VERTEBRAL SEL VERTEBRAL BILAT MOD SED  03/19/2019   IR ANGIO VERTEBRAL SEL VERTEBRAL UNI L MOD SED  06/04/2020   IR ANGIOGRAM FOLLOW UP STUDY  04/15/2019   IR RADIOLOGIST EVAL & MGMT  12/30/2016   IR TRANSCATH/EMBOLIZ  04/15/2019   IR US GUIDE VASC ACCESS RIGHT  03/19/2019   IR US GUIDE VASC ACCESS RIGHT  06/04/2020   MALONEY DILATION N/A 09/20/2018   Procedure: Elease Hashimoto DILATION;  Surgeon: Corbin Ade, MD;  Location: [facility];  Service: Endoscopy;  Laterality: N/A;   MALONEY DILATION N/A 01/04/2023   Procedure: Elease Hashimoto DILATION;  Surgeon: Corbin Ade, MD;  Location: [facility];  Service: Endoscopy;  Laterality: N/A;   RADIOLOGY WITH ANESTHESIA N/A 04/15/2019   Procedure: Sharman Crate;   Surgeon: Julieanne Cotton, MD;  Location: [facility] OR;  Service: Radiology;  Laterality: N/A;   REVERSE SHOULDER ARTHROPLASTY Left 03/21/2023   Procedure: LEFT REVERSE SHOULDER ARTHROPLASTY;  Surgeon: Cammy Copa, MD;  Location: [facility] OR;  Service: Orthopedics;  Laterality: Left;   RIGHT/LEFT HEART CATH AND CORONARY ANGIOGRAPHY N/A 08/19/2019   Procedure: RIGHT/LEFT HEART CATH AND CORONARY ANGIOGRAPHY;  Surgeon: Laurey Morale, MD;  Location: [facility] INVASIVE CV LAB;  Service: Cardiovascular;  Laterality: N/A;   TUBAL LIGATION  1987   VAGINAL HYSTERECTOMY  2009   Patient Active Problem List   Diagnosis Date Noted   Arthritis of left shoulder region 04/02/2023   Biceps tendonitis on left 04/02/2023   OA (osteoarthritis) of shoulder 03/21/2023   S/P reverse total shoulder arthroplasty, left 03/21/2023   RUQ pain 06/29/2022   Pancreatic cyst 06/29/2022   Peripheral neuropathy 05/04/2022   Restless leg syndrome 05/04/2022   Hypertriglyceridemia 05/04/2022  Bilateral shoulder pain 05/04/2022   Encounter for general adult medical examination with abnormal findings 05/04/2022   H/O total hysterectomy 04/26/2022   Odynophagia 04/20/2022   Abdominal pain, epigastric 04/20/2022   Nausea without vomiting 06/16/2021   Allergy to alpha-gal 06/16/2021   Orthostatic hypotension 06/16/2021   Hypocortisolemia (HCC) 03/02/2021   Adrenal adenoma, left 03/02/2021   AKI (acute kidney injury) (HCC) 01/04/2021   Intractable nausea and vomiting 01/03/2021   Left-sided weakness 10/19/2020   Cough 02/11/2020   Near syncope 12/10/2019   Brain aneurysm 04/15/2019   Dysphagia 02/27/2018   Encounter for screening colonoscopy 02/27/2018   History of Clostridium difficile infection 02/27/2018   Chronic diarrhea 12/20/2017   Chronic combined systolic and diastolic congestive heart failure (HCC) 06/20/2016   Hypokalemia due to excessive gastrointestinal loss of potassium    Acute CHF (congestive heart failure)  (HCC) 05/16/2016   Acute on chronic systolic CHF (congestive heart failure) (HCC) 05/16/2016   Acute respiratory failure with hypoxia (HCC)    Thrombocytosis 03/26/2016   Cardiomyopathy, ischemic 03/25/2016   Chronic combined systolic and diastolic heart failure (HCC) 03/25/2016   Anxiety state 03/25/2016   Troponin level elevated 03/25/2016   Coronary artery disease involving native coronary artery of native heart 03/25/2016   Normocytic anemia 03/25/2016   SOB (shortness of breath) 03/24/2016   Lightheadedness 03/17/2016   Hypotension 03/17/2016   Tobacco abuse 03/12/2016   NSTEMI (non-ST elevated myocardial infarction) (HCC) 03/11/2016   Atypical chest pain    Essential hypertension 09/06/2015   Hyperlipidemia 09/06/2015   GERD without esophagitis 09/06/2015   Chest pain 09/06/2015    PCP: Christel Mormon, MD REFERRING PROVIDER: Lenice Pressman, MD  ONSET DATE: 03/21/23  REFERRING DIAG: L Reverse Total Shoulder  THERAPY DIAG:  Acute pain of left shoulder  Stiffness of left shoulder, not elsewhere classified  Other symptoms and signs involving the musculoskeletal system  Rationale for Evaluation and Treatment: Rehabilitation  SUBJECTIVE:   SUBJECTIVE STATEMENT: "He took me out of the sling last week." Pt accompanied by: self  PERTINENT HISTORY: Pt reports L shoulder pain for ~7 years with worsening pain. This past year she reports limiting ability to cook, clean, and complete certain parts of dressing and bathing. On 03/21/23, pt s/p L Reverse Total Shoulder.   PRECAUTIONS: Shoulder  WEIGHT BEARING RESTRICTIONS: Yes >1lb  PAIN:  Are you having pain? Yes: NPRS scale: 3/10 Pain location: Deltoid Pain description: dull ache Aggravating factors: Cold, movement Relieving factors: medication  FALLS: Has patient fallen in last 6 months? No  PLOF: Independent  PATIENT GOALS: "To be able to fasten my own bra"  NEXT MD VISIT: 05/05/23  OBJECTIVE:   HAND  DOMINANCE: Right  ADLs: Overall ADLs: Pt reports inability to clasp her bra, put her hair up, or wash her hair. Due to precautions and pain/ROM limits she is unable to lift and carry any items heavier than her cell phone.   FUNCTIONAL OUTCOME MEASURES: FOTO: 42.21  UPPER EXTREMITY ROM:       Assessed in supine, er/IR adducted  Passive ROM Left eval  Shoulder flexion 99  Shoulder abduction 78  Shoulder internal rotation 90  Shoulder external rotation 24  (Blank rows = not tested)    UPPER EXTREMITY MMT:     Assessed in seated, er/IR adducted  MMT Left eval  Shoulder flexion   Shoulder abduction   Shoulder internal rotation   Shoulder external rotation   Middle trapezius   Lower trapezius   (  Blank rows = not tested)  SENSATION: Mild numbness and tingling noted intermittently in finger tips and elbow  EDEMA: no swelling noted  OBSERVATIONS: Moderate fascial restrictions    TODAY'S TREATMENT:                                                                                                                              DATE:   04/17/23 -manual therapy: myofascial release and trigger point applied to biceps, trapezius, deltoid, and scapular region for improving pain and fascial restrictions, as well as ROM -P/ROM: supine, flexion, abduction, er/IR, x10 -AA/ROM: supine, flexion, abduction, protraction, x8 -Low Level Wall Wash, x60" -Thumb tacs, x60" -Pendulums, 2x30"   PATIENT EDUCATION: Education details: Pendulums Person educated: Patient Education method: Explanation, Demonstration, and Handouts Education comprehension: verbalized understanding and returned demonstration  HOME EXERCISE PROGRAM: 12/2: Table Slides 12/9: Pendulums  GOALS: Goals reviewed with patient? Yes   SHORT TERM GOALS: Target date: 05/08/23  Pt will be provided with and educated on HEP to improve mobility in LUE required for use during ADL completion.   Goal status: IN  PROGRESS  2.  Pt will increase LUE P/ROM by 30 degrees to improve ability to use LUE during dressing tasks with minimal compensatory techniques.   Goal status: IN PROGRESS  3.  Pt will increase LUE strength to 3+/5 to improve ability to reach for items at waist to chest height during bathing and grooming tasks.   Goal status: IN PROGRESS   LONG TERM GOALS: Target date: 06/09/23  Pt will decrease pain in LUE to 3/10 or less to improve ability to sleep for 2+ consecutive hours without waking due to pain.   Goal status: IN PROGRESS  2.  Pt will decrease LUE fascial restrictions to min amounts or less to improve mobility required for functional reaching tasks.   Goal status: IN PROGRESS  3.  Pt will increase LUE A/ROM by 45 degrees to improve ability to use LUE when reaching overhead or behind back during dressing and bathing tasks.   Goal status: IN PROGRESS  4.  Pt will increase LUE strength to 4+/5 or greater to improve ability to use LUE when lifting or carrying items during meal preparation/housework/yardwork tasks.   Goal status: IN PROGRESS  5.  Pt will return to highest level of function using LUE as non-dominant during functional task completion.   Goal status: IN PROGRESS   ASSESSMENT:  CLINICAL IMPRESSION: This session, pt initially presenting with decreased pain and good ROM passively. When starting AA/ROM she has increased pain in her elbow, limiting her ability to complete horizontal abduction and protraction. Overall her endurance is good with all tasks, only limited by pain. Verbal and tactile cuing provided for positioning and technique.   PERFORMANCE DEFICITS: in functional skills including in functional skills including ADLs, IADLs, coordination, tone, ROM, strength, pain, fascial restrictions, muscle spasms, and UE functional use.   PLAN:  OT FREQUENCY: 2x/week  OT  DURATION: 8 weeks  PLANNED INTERVENTIONS: 97168 OT Re-evaluation, 97535 self care/ADL  training, 27782 therapeutic exercise, 97530 therapeutic activity, 97140 manual therapy, 97035 ultrasound, 97010 moist heat, 97032 electrical stimulation (manual), passive range of motion, energy conservation, coping strategies training, patient/family education, and DME and/or AE instructions  RECOMMENDED OTHER SERVICES: N/A  CONSULTED AND AGREED WITH PLAN OF CARE: Patient  PLAN FOR NEXT SESSION: Manual Therapy, P/ROM, Table Slides, Thumb Tacs, Pulleys   Shaneya Taketa Bing Plume, OTR/L [facility] [phone] Kennyth Arnold, OT 04/17/2023, 4:19 PM

## 2023-04-17 NOTE — Transitions of Care (Post Inpatient/ED Visit) (Unsigned)
   04/17/2023  Name: Erin [name] MRN: [id number] DOB: [birth year]  Today's TOC FU Call Status: Today's TOC FU Call Status:: Unsuccessful Call (1st Attempt) Unsuccessful Call (1st Attempt) Date: 04/17/23  Attempted to reach the patient regarding the most recent Inpatient/ED visit.  Follow Up Plan: Additional outreach attempts will be made to reach the patient to complete the Transitions of Care (Post Inpatient/ED visit) call.    Daylyn Azbill, CMA  [facility] AWV Team Direct Dial: [phone]

## 2023-04-18 NOTE — Transitions of Care (Post Inpatient/ED Visit) (Signed)
 04/18/2023  Name: Erin [name] MRN: [id number] DOB: 09-May-1961  Today's TOC FU Call Status: Today's TOC FU Call Status:: Successful TOC FU Call Completed Unsuccessful Call (1st Attempt) Date: 04/15/23 [facility] FU Call Complete Date: 04/18/23 Patient's Name and Date of Birth confirmed.  Transition Care Management Follow-up Telephone Call Date of Discharge: 04/15/23 [facility] Type of Discharge: Emergency Department Reason for ED Visit: Other: Cardiac Conditions Diagnosis: Heart Failure (CHF,Heart Failure Diagnosis) How have you been since you were released from the hospital?: Same (pt states she still feels short of breath) Any questions or concerns?: No  Items Reviewed: Did you receive and understand the discharge instructions provided?: Yes Medications obtained,verified, and reconciled?: Yes (Medications Reviewed) Any new allergies since your discharge?: No Dietary orders reviewed?: NA Do you have support at home?: Yes  Medications Reviewed Today: Medications Reviewed Today     Reviewed by Mattisyn Cardona, Jordan Hawks, CMA (Certified Medical Assistant) on 04/18/23 at 1534  Med List Status: <None>   Medication Order Taking? Sig Documenting Provider Last Dose Status Informant  aspirin EC 81 MG tablet [id number] No Take 81 mg by mouth every evening.  [provider] Taking Active Self  atorvastatin (LIPITOR) 80 MG tablet [id number] No Take 1 tablet (80 mg total) by mouth daily. Laurey Morale, MD Taking Active Self  clopidogrel (PLAVIX) 75 MG tablet [id number] No Take 1 tablet by mouth once daily Laurey Morale, MD Taking Active Self  cyproheptadine (PERIACTIN) 4 MG tablet [id number] No Take 8 mg by mouth 2 (two) times daily. [provider] Taking Active Self  dapagliflozin propanediol (FARXIGA) 10 MG TABS tablet [id number] No Take 1 tablet (10 mg total) by mouth daily before breakfast. Jacklynn Ganong, FNP Taking Active Self  EPINEPHrine (EPIPEN  2-PAK) 0.3 mg/0.3 mL IJ SOAJ injection [id number] No Inject 0.3 mg into the muscle as needed for anaphylaxis. Alfonse Spruce, MD Taking Active Self  Evolocumab [facility] SURECLICK) 140 MG/ML [place] [id number] No Inject 140 mg into the skin every 14 (fourteen) days. Laurey Morale, MD Taking Active Self  fenofibrate (TRICOR) 145 MG tablet [id number] No Take 1 tablet (145 mg total) by mouth daily. Laurey Morale, MD Taking Active Self  furosemide (LASIX) 20 MG tablet [id number]  Take 2 tablets (40 mg total) by mouth 2 (two) times daily. [place], [place], [place]  Active   gabapentin (NEURONTIN) 300 MG capsule [id number] No Take 1 capsule by mouth at bedtime Billie Lade, MD Taking Active   linaclotide [facility]) 145 MCG CAPS capsule [id number] No Take 1 capsule (145 mcg total) by mouth daily before breakfast. Corbin Ade, MD Taking Active Self  loperamide (IMODIUM A-D) 2 MG tablet [id number] No Take 2 mg by mouth 4 (four) times daily as needed for diarrhea or loose stools. [provider] Taking Active Self  losartan (COZAAR) 25 MG tablet [id number] No Take 0.5 tablets (12.5 mg total) by mouth at bedtime. Laurey Morale, MD Taking Active   metoprolol succinate (TOPROL XL) 25 MG 24 hr tablet [id number] No Take 1 tablet (25 mg total) by mouth daily.  Patient taking differently: Take 12.5 mg by mouth daily.   Laurey Morale, MD Taking Active Self  Multiple Vitamins-Minerals (MULTIVITAMIN WITH MINERALS) tablet [id number] No Take 1 tablet by mouth daily. [provider] Taking Active Self  nitroGLYCERIN (NITROSTAT) 0.4 MG SL tablet [id number] No Place 1 tablet (0.4 mg total) under the tongue every 5 (five) minutes x 3  doses as needed for chest pain (if no relief after 2nd dose, proceed to the ED for an evaluation or call 911). Laurey Morale, MD Taking Active Self  ondansetron [facility]) 4 MG tablet [id number] No Take 1 tablet (4 mg total) by mouth every 6 (six) hours as needed for  nausea or vomiting. Corbin Ade, MD Taking Active Self  pantoprazole (PROTONIX) 40 MG tablet [id number] No TAKE 1 TABLET BY MOUTH TWICE DAILY BEFORE A MEAL Gelene Mink, NP Taking Active Self  potassium chloride SA (KLOR-CON M) 20 MEQ tablet [id number]  Take 2 tablets (40 mEq total) by mouth 2 (two) times daily. [place], [employer], [place]  Active   predniSONE (DELTASONE) 10 MG tablet [id number]  Take 4 tablets (40 mg total) by mouth daily. Vanetta Mulders, MD  Active   rOPINIRole (REQUIP) 0.5 MG tablet [id number] No Take 0.5 mg by mouth at bedtime.  [provider] Taking Active Self  scopolamine (TRANSDERM-SCOP) 1 MG/3DAYS [id number] No Place 1 patch (1.5 mg total) onto the skin every 3 (three) days. Judithann Sheen, PA Taking Active Self  spironolactone (ALDACTONE) 25 MG tablet [id number] No Take 0.5 tablets (12.5 mg total) by mouth at bedtime.  Patient taking differently: Take 25 mg by mouth daily.   Laurey Morale, MD Taking Active   sucralfate (CARAFATE) 1 GM/10ML suspension [id number] No Take 10 mLs (1 g total) by mouth 4 (four) times daily. Lanelle Bal, DO Taking Active Self  traMADol (ULTRAM) 50 MG tablet [id number] No Take 1 tablet (50 mg total) by mouth every 6 (six) hours as needed. [place], Joycie Peek, PA-C Taking Active   varenicline (CHANTIX) 1 MG tablet [id number] No Take 1 tablet (1 mg total) by mouth 2 (two) times daily. [place], [place], FNP Taking Active Self  Vitamin D, Ergocalciferol, (DRISDOL) 1.25 MG (50000 UNIT) CAPS capsule [id number] No Take 50,000 Units by mouth every 7 (seven) days. [provider] Taking Active Self            Home Care and Equipment/Supplies: Were Home Health Services Ordered?: NA Any new equipment or medical supplies ordered?: NA  Functional Questionnaire: Do you need assistance with bathing/showering or dressing?: No Do you need assistance with meal preparation?: No Do you need assistance with eating?: No Do you have  difficulty maintaining continence: No Do you need assistance with getting out of bed/getting out of a chair/moving?: No Do you have difficulty managing or taking your medications?: No  Follow up appointments reviewed: PCP Follow-up appointment confirmed?: Yes Date of PCP follow-up appointment?: 04/20/23 Follow-up Provider: Gilmore Laroche Specialist [facility] Follow-up appointment confirmed?: NA Do you need transportation to your follow-up appointment?: No Do you understand care options if your condition(s) worsen?: Yes-patient verbalized understanding     Meryn Sarracino, CMA  [facility] AWV Team Direct Dial: [phone]

## 2023-04-19 ENCOUNTER — Encounter (HOSPITAL_COMMUNITY): Payer: Medicare PPO | Admitting: Occupational Therapy

## 2023-04-19 ENCOUNTER — Ambulatory Visit (INDEPENDENT_AMBULATORY_CARE_PROVIDER_SITE_OTHER): Payer: Medicare PPO

## 2023-04-19 ENCOUNTER — Telehealth: Payer: Self-pay

## 2023-04-19 DIAGNOSIS — Z95810 Presence of automatic (implantable) cardiac defibrillator: Secondary | ICD-10-CM

## 2023-04-19 DIAGNOSIS — I5022 Chronic systolic (congestive) heart failure: Secondary | ICD-10-CM

## 2023-04-19 NOTE — Progress Notes (Signed)
EPIC Encounter for ICM Monitoring  Patient Name: Erin [name] is a 62 y.o. female Date: 04/19/2023 Primary Care Physican: Billie Lade, MD Primary Cardiologist: Shirlee Latch Electrophysiologist: Elberta Fortis 08/11/2021 Office Weight: 121 lbs. 03/25/2022 Office Weight 125-127 lbs 03/13/2023 Weight: 110 lbs 03/24/2023 Weight: 115 lbs                                                          Spoke with patient and heart failure questions reviewed.  Transmission results reviewed.  Pt reports she is feeling fine.  She did have 12/7 ED for SOB but ED physician thinks it was related to panic attack.   Pt denies any fluid symptoms.   Optivol thoracic impedance suggesting fluid levels have returned closer to baseline normal.   Prescribed: Furosemide 20 mg Take 2 tablets (40 mg total) by mouth two times daily Potassium 20 mEq take 2 tablets(40 mEq total) by mouth twice a day Spironolactone 25 mg take 0.5 tablet (12.5 mg total) by mouth daily   Labs: 04/24/2023 Scheduled BMET 04/14/2023 Creatinine 1.34, BUN 13, Potassium 3.5, Sodium 137, GFR 45 03/26/2023 Creatinine 1.66, BUN 22, Potassium 4.1, Sodium 136, GFR 35 03/14/2023 Creatinine 1.75, BUN 21, Potassium 3.9, Sodium 140, GFR 33  01/24/2023 Creatinine 1.62, BUN 18, Potassium 3.8, Sodium 137, GFR 36  01/12/2023 Creatinine 1.46, BUN 20, Potassium 4.0, Sodium 137, GFR 41  12/19/2022 Creatinine 1.27, BUN 11, Potassium 4.2, Sodium 142, GFR 48 A complete set of results can be found in Results Review.   Recommendations:  Sent to [name], NP at [facility] clinic as requested following 12/6 OV.     Follow-up plan: ICM clinic phone appointment on 05/15/2023.   91 day device clinic remote transmission 05/17/2023.     EP/Cardiology Office Visits:  05/26/2023 with HF Clinic.  Recall 06/04/2023 with Dr Elberta Fortis.     Copy of ICM check sent to Dr. Elberta Fortis.    3 month ICM trend: 04/19/2023.    12-14 Month ICM trend:     Karie Soda, RN 04/19/2023 1:57  PM

## 2023-04-19 NOTE — Progress Notes (Addendum)
Established Patient Office Visit  Subjective:  Patient ID: Erin [name], female    DOB: [birth year]  Age: 62 y.o. MRN: [id number]  CC:  Chief Complaint  Patient presents with   Follow-up    Pt d/c on 04/15/2023    HPI [name] is a 62 y.o. female with past medical history of hypertension, hyperlipidemia, coronary artery disease, heart failure with reduced ejection fraction  presents for ED f/u.  Shortness of breath: The patient was seen in the ED on 04/15/2023 with complaints of shortness of breath, which improved when she went outside into the cool air. She denied associated symptoms such as chest pain, heaviness, tightness, pressure, nausea, vomiting, or diaphoresis. A trial of albuterol and ipratropium via nebulizer provided no relief. A chest X-ray revealed no active cardiopulmonary disease, and a CT angiogram showed no evidence of pulmonary embolism. The patient was discharged with the impression that the episode might have been related to a panic attack.  At today's visit, the patient reports feeling well and believes her symptoms are likely related to panic attacks. She shared that she has been afraid to sleep at night due to receiving multiple reports of the deaths of family members and loved ones, which occurred at nighttime. Since leaving the ED, she has not experienced similar episodes but was encouraged to follow up for potential initiation of pharmacological treatment. She denies any suicidal thoughts or ideation. However, the patient appeared very tearful during the visit as she expressed her fear of sleeping at night, feeling as though something bad might happen.   Past Medical History:  Diagnosis Date   AICD (automatic cardioverter/defibrillator) present    Allergic reaction to alpha-gal    Allergy 03/05/2022   Anemia    Anxiety state 03/25/2016   Arthritis    Basal cell carcinoma of forehead    Brain aneurysm    CHF (congestive heart failure) (HCC)     Coronary artery disease    a. 03/11/16 PCI with DES-->Prox/Mid Cx;  b. 03/14/16 PCI with DES x2-->RCA, EF 30-35%.   Depression 06/09/2010   Encounter for general adult medical examination with abnormal findings 05/04/2022   Essential hypertension    Hx   GERD (gastroesophageal reflux disease)    HFrEF (heart failure with reduced ejection fraction) (HCC)    a. 10/2016 Echo: EF 35-40%, Gr1 DD, mild focal basal septal hypertrophy, basal inflat, mid inflat, basal antlat AK. Mid infept/inf/antlat, apical lateral sev HK. Mod MR. mildly reduced RV fxn. Mild TR.   History of pneumonia    Hyperlipidemia    IBS (irritable bowel syndrome)    Ischemic cardiomyopathy    a. 10/2016 Echo: EF 35-40%, Gr1 DD.   Mitral regurgitation    Neuromuscular disorder (HCC)    NSTEMI (non-ST elevated myocardial infarction) (HCC) 03/10/2016   Pneumonia 03/2016   Shingles    Squamous cell cancer of skin of nose    Thrombocytosis 03/26/2016   Tobacco abuse    Trichimoniasis    Wears dentures    Wears glasses     Past Surgical History:  Procedure Laterality Date   APPENDECTOMY     BICEPT TENODESIS Left 03/21/2023   Procedure: BICEPS TENODESIS;  Surgeon: Cammy Copa, MD;  Location: [facility] OR;  Service: Orthopedics;  Laterality: Left;   BIOPSY  09/20/2018   Procedure: BIOPSY;  Surgeon: Corbin Ade, MD;  Location: [facility];  Service: Endoscopy;;  colon   BIOPSY  01/05/2021   Procedure: BIOPSY;  Surgeon: Rachael Fee, MD;  Location: [facility] ENDOSCOPY;  Service: Endoscopy;;   BIOPSY  04/27/2022   Procedure: BIOPSY;  Surgeon: Lanelle Bal, DO;  Location: [facility];  Service: Endoscopy;;   CARDIAC CATHETERIZATION N/A 03/11/2016   Procedure: Left Heart Cath and Coronary Angiography;  Surgeon: Marykay Lex, MD;  Location: [facility] INVASIVE CV LAB;  Service: Cardiovascular;  Laterality: N/A;   CARDIAC CATHETERIZATION N/A 03/11/2016   Procedure: Coronary Stent Intervention;  Surgeon: Marykay Lex, MD;   Location: [facility] INVASIVE CV LAB;  Service: Cardiovascular;  Laterality: N/A;   CARDIAC CATHETERIZATION N/A 03/14/2016   Procedure: Coronary Stent Intervention;  Surgeon: Peter M Swaziland, MD;  Location: [facility] INVASIVE CV LAB;  Service: Cardiovascular;  Laterality: N/A;   CEREBRAL ANEURYSM REPAIR  04/2019   stent placed   CHOLECYSTECTOMY OPEN  1984   COLONOSCOPY WITH PROPOFOL N/A 09/20/2018   Procedure: COLONOSCOPY WITH PROPOFOL;  Surgeon: Corbin Ade, MD;  [facility]: [facility];  Service: Endoscopy;  Laterality: N/A;  10:30am   CORONARY ANGIOPLASTY WITH STENT PLACEMENT  03/14/2016   ESOPHAGOGASTRODUODENOSCOPY (EGD) WITH PROPOFOL N/A 09/20/2018   Procedure: ESOPHAGOGASTRODUODENOSCOPY (EGD) WITH PROPOFOL;  Surgeon: Corbin Ade, MD;  [facility];  Service: Endoscopy;  Laterality: N/A;   ESOPHAGOGASTRODUODENOSCOPY (EGD) WITH PROPOFOL N/A 01/05/2021   Procedure: ESOPHAGOGASTRODUODENOSCOPY (EGD) WITH PROPOFOL;  Surgeon: Rachael Fee, MD;  Location: [facility] ENDOSCOPY;  Service: Endoscopy;  Laterality: N/A;   ESOPHAGOGASTRODUODENOSCOPY (EGD) WITH PROPOFOL N/A 04/27/2022   Procedure: ESOPHAGOGASTRODUODENOSCOPY (EGD) WITH PROPOFOL;  Surgeon: Lanelle Bal, DO;  [facility];  Service: Endoscopy;  Laterality: N/A;  9:15am, asa 3/4, ASAP   ESOPHAGOGASTRODUODENOSCOPY (EGD) WITH PROPOFOL N/A 01/04/2023   Procedure: ESOPHAGOGASTRODUODENOSCOPY (EGD) WITH PROPOFOL;  Surgeon: Corbin Ade, MD;  [facility]: [facility];  Service: Endoscopy;  Laterality: N/A;  130pm, asa 3   FINGER ARTHROPLASTY Left 05/14/2013   Procedure: LEFT THUMB CARPAL METACARPAL ARTHROPLASTY;  Surgeon: Tami Ribas, MD;  Location: [facility];  Service: Orthopedics;  Laterality: Left;   ICD IMPLANT N/A 04/03/2020   Procedure: ICD IMPLANT;  Surgeon: Regan Lemming, MD;  Location: [facility] INVASIVE CV LAB;  Service: Cardiovascular;  Laterality: N/A;   IR ANGIO INTRA EXTRACRAN SEL COM CAROTID  INNOMINATE BILAT MOD SED  01/05/2017   IR ANGIO INTRA EXTRACRAN SEL COM CAROTID INNOMINATE BILAT MOD SED  03/19/2019   IR ANGIO INTRA EXTRACRAN SEL COM CAROTID INNOMINATE BILAT MOD SED  06/04/2020   IR ANGIO INTRA EXTRACRAN SEL INTERNAL CAROTID UNI L MOD SED  04/15/2019   IR ANGIO VERTEBRAL SEL VERTEBRAL BILAT MOD SED  01/05/2017   IR ANGIO VERTEBRAL SEL VERTEBRAL BILAT MOD SED  03/19/2019   IR ANGIO VERTEBRAL SEL VERTEBRAL UNI L MOD SED  06/04/2020   IR ANGIOGRAM FOLLOW UP STUDY  04/15/2019   IR RADIOLOGIST EVAL & MGMT  12/30/2016   IR TRANSCATH/EMBOLIZ  04/15/2019   IR US GUIDE VASC ACCESS RIGHT  03/19/2019   IR US GUIDE VASC ACCESS RIGHT  06/04/2020   MALONEY DILATION N/A 09/20/2018   Procedure: Elease Hashimoto DILATION;  Surgeon: Corbin Ade, MD;  [facility]: [facility];  Service: Endoscopy;  Laterality: N/A;   MALONEY DILATION N/A 01/04/2023   Procedure: Elease Hashimoto DILATION;  Surgeon: Corbin Ade, MD;  Location: [facility];  Service: Endoscopy;  Laterality: N/A;   RADIOLOGY WITH ANESTHESIA N/A 04/15/2019   Procedure: Sharman Crate;  Surgeon: Julieanne Cotton, MD;  Location:  [facility] OR;  Service: Radiology;  Laterality: N/A;   REVERSE SHOULDER ARTHROPLASTY Left 03/21/2023   Procedure: LEFT REVERSE SHOULDER ARTHROPLASTY;  Surgeon: Cammy Copa, MD;  Location: [facility] OR;  Service: Orthopedics;  Laterality: Left;   RIGHT/LEFT HEART CATH AND CORONARY ANGIOGRAPHY N/A 08/19/2019   Procedure: RIGHT/LEFT HEART CATH AND CORONARY ANGIOGRAPHY;  Surgeon: Laurey Morale, MD;  Location: [facility] INVASIVE CV LAB;  Service: Cardiovascular;  Laterality: N/A;   TUBAL LIGATION  1987   VAGINAL HYSTERECTOMY  2009    Family History  Problem Relation Age of Onset   Stroke Mother    Hypertension Mother    Diabetes Mother    Heart attack Mother    Heart attack Father    Diabetes Father    Hypertension Father    CAD Father    Colon polyps Father 60       pre-cancerous    Stroke Father    Dementia Father     Hyperlipidemia Father    Arthritis Father    COPD Father    Heart disease Father    Breast cancer Maternal Grandmother    Diabetes Maternal Grandmother    Cancer Maternal Grandfather        Tongue and esophageal   Anxiety disorder Daughter    Depression Daughter    Anxiety disorder Daughter    Heart failure Other    Cancer Paternal Grandmother    Colon cancer Neg Hx     Social History   Socioeconomic History   Marital status: Married    Spouse name: Not on file   Number of children: Not on file   Years of education: Not on file   Highest education level: Associate degree: occupational, Scientist, product/process development, or vocational program  Occupational History   Occupation: CNA  Tobacco Use   Smoking status: Some Days    Current packs/day: 0.25    Average packs/day: 0.3 packs/day for 15.0 years (3.8 ttl pk-yrs)    Types: Cigarettes   Smokeless tobacco: Never   Tobacco comments:    smokes a cigarette occasionally  Vaping Use   Vaping status: Never Used  Substance and Sexual Activity   Alcohol use: Not Currently    Comment: occasionally   Drug use: Yes    Types: Marijuana   Sexual activity: Not Currently    Birth control/protection: Surgical    Comment: hyst  Other Topics Concern   Not on file  Social History Narrative   Lives with husband in [place] in a one story home with a basement.  Has 4 children.  Works as a Lawyer.  Education: CNA school.    Social Drivers of Corporate investment banker Strain: Low Risk  (04/20/2023)   Overall Financial Resource Strain (CARDIA)    Difficulty of Paying Living Expenses: Not hard at all  Food Insecurity: No Food Insecurity (04/20/2023)   Hunger Vital Sign    Worried About Running Out of Food in the Last Year: Never true    Ran Out of Food in the Last Year: Never true  Transportation Needs: No Transportation Needs (04/20/2023)   PRAPARE - Administrator, Civil Service (Medical): No    Lack of Transportation (Non-Medical): No   Physical Activity: Insufficiently Active (04/20/2023)   Exercise Vital Sign    Days of Exercise per Week: 3 days    Minutes of Exercise per Session: 30 min  Stress: Stress Concern Present (04/20/2023)   [employer] of Occupational Health - Occupational Stress  Questionnaire    Feeling of Stress : Very much  Social Connections: Socially Isolated (04/20/2023)   Social Connection and Isolation Panel [NHANES]    Frequency of Communication with Friends and Family: Once a week    Frequency of Social Gatherings with Friends and Family: Once a week    Attends Religious Services: Never    Database administrator or Organizations: No    Attends Engineer, structural: Not on file    Marital Status: Married  Catering manager Violence: Not At Risk (03/21/2023)   Humiliation, Afraid, Rape, and Kick questionnaire    Fear of Current or Ex-Partner: No    Emotionally Abused: No    Physically Abused: No    Sexually Abused: No    Outpatient Medications Prior to Visit  Medication Sig Dispense Refill   aspirin EC 81 MG tablet Take 81 mg by mouth every evening.      atorvastatin (LIPITOR) 80 MG tablet Take 1 tablet (80 mg total) by mouth daily. 90 tablet 3   clopidogrel (PLAVIX) 75 MG tablet Take 1 tablet by mouth once daily 30 tablet 11   cyproheptadine (PERIACTIN) 4 MG tablet Take 8 mg by mouth 2 (two) times daily.     dapagliflozin propanediol (FARXIGA) 10 MG TABS tablet Take 1 tablet (10 mg total) by mouth daily before breakfast. 30 tablet 11   EPINEPHrine (EPIPEN 2-PAK) 0.3 mg/0.3 mL IJ SOAJ injection Inject 0.3 mg into the muscle as needed for anaphylaxis. 2 each 2   Evolocumab (REPATHA SURECLICK) 140 MG/ML SOAJ Inject 140 mg into the skin every 14 (fourteen) days. 6 mL 3   fenofibrate (TRICOR) 145 MG tablet Take 1 tablet (145 mg total) by mouth daily. 90 tablet 3   furosemide (LASIX) 20 MG tablet Take 2 tablets (40 mg total) by mouth 2 (two) times daily. 60 tablet 6   gabapentin  (NEURONTIN) 300 MG capsule Take 1 capsule by mouth at bedtime 30 capsule 0   linaclotide (LINZESS) 145 MCG CAPS capsule Take 1 capsule (145 mcg total) by mouth daily before breakfast. 30 capsule 11   loperamide (IMODIUM A-D) 2 MG tablet Take 2 mg by mouth 4 (four) times daily as needed for diarrhea or loose stools.     losartan (COZAAR) 25 MG tablet Take 0.5 tablets (12.5 mg total) by mouth at bedtime.     metoprolol succinate (TOPROL XL) 25 MG 24 hr tablet Take 1 tablet (25 mg total) by mouth daily. (Patient taking differently: Take 12.5 mg by mouth daily.) 90 tablet 3   Multiple Vitamins-Minerals (MULTIVITAMIN WITH MINERALS) tablet Take 1 tablet by mouth daily.     nitroGLYCERIN (NITROSTAT) 0.4 MG SL tablet Place 1 tablet (0.4 mg total) under the tongue every 5 (five) minutes x 3 doses as needed for chest pain (if no relief after 2nd dose, proceed to the ED for an evaluation or call 911). 25 tablet 2   ondansetron (ZOFRAN) 4 MG tablet Take 1 tablet (4 mg total) by mouth every 6 (six) hours as needed for nausea or vomiting. 60 tablet 5   pantoprazole (PROTONIX) 40 MG tablet TAKE 1 TABLET BY MOUTH TWICE DAILY BEFORE A MEAL 60 tablet 3   potassium chloride SA (KLOR-CON M) 20 MEQ tablet Take 2 tablets (40 mEq total) by mouth 2 (two) times daily. 60 tablet 6   predniSONE (DELTASONE) 10 MG tablet Take 4 tablets (40 mg total) by mouth daily. 20 tablet 0   rOPINIRole (REQUIP) 0.5 MG  tablet Take 0.5 mg by mouth at bedtime.   0   scopolamine (TRANSDERM-SCOP) 1 MG/3DAYS Place 1 patch (1.5 mg total) onto the skin every 3 (three) days. 10 patch 5   spironolactone (ALDACTONE) 25 MG tablet Take 0.5 tablets (12.5 mg total) by mouth at bedtime. (Patient taking differently: Take 25 mg by mouth daily.)     sucralfate (CARAFATE) 1 GM/10ML suspension Take 10 mLs (1 g total) by mouth 4 (four) times daily. 420 mL 1   traMADol (ULTRAM) 50 MG tablet Take 1 tablet (50 mg total) by mouth every 6 (six) hours as needed. 30  tablet 0   varenicline (CHANTIX) 1 MG tablet Take 1 tablet (1 mg total) by mouth 2 (two) times daily. 60 tablet 3   Vitamin D, Ergocalciferol, (DRISDOL) 1.25 MG (50000 UNIT) CAPS capsule Take 50,000 Units by mouth every 7 (seven) days.     No facility-administered medications prior to visit.    Allergies  Allergen Reactions   Alpha-Gal Shortness Of Breath, Nausea And Vomiting and Dermatitis   Other Shortness Of Breath, Diarrhea, Nausea And Vomiting and Nausea Only    All- red meats  Medications in Capsule form    Tape Other (See Comments)    PEELS SKIN OFF  (PAPER TAPE IS FINE)    ROS Review of Systems  Constitutional:  Negative for chills and fever.  Eyes:  Negative for visual disturbance.  Respiratory:  Negative for chest tightness and shortness of breath.   Neurological:  Negative for dizziness and headaches.      Objective:    Physical Exam HENT:     Head: Normocephalic.     Mouth/Throat:     Mouth: Mucous membranes are moist.  Cardiovascular:     Rate and Rhythm: Normal rate.     Heart sounds: Normal heart sounds.  Pulmonary:     Effort: Pulmonary effort is normal.     Breath sounds: Normal breath sounds.  Neurological:     Mental Status: She is alert.     BP 96/66   Pulse 90   Ht 5' (1.524 m)   Wt 108 lb 1.3 oz (49 kg)   SpO2 97%   BMI 21.11 kg/m  Wt Readings from Last 3 Encounters:  04/20/23 108 lb 1.3 oz (49 kg)  04/15/23 111 lb 9.6 oz (50.6 kg)  04/14/23 111 lb 9.6 oz (50.6 kg)    Lab Results  Component Value Date   TSH 0.702 01/24/2023   Lab Results  Component Value Date   WBC 9.6 04/15/2023   HGB 11.4 (L) 04/15/2023   HCT 34.8 (L) 04/15/2023   MCV 82.3 04/15/2023   PLT 602 (H) 04/15/2023   Lab Results  Component Value Date   NA 137 04/15/2023   K 3.9 04/15/2023   CO2 24 04/15/2023   GLUCOSE 127 (H) 04/15/2023   BUN 19 04/15/2023   CREATININE 1.50 (H) 04/15/2023   BILITOT 0.6 01/24/2023   ALKPHOS 32 (L) 01/24/2023   AST 27  01/24/2023   ALT 24 01/24/2023   PROT 6.7 01/24/2023   ALBUMIN 3.8 01/24/2023   CALCIUM 10.0 04/15/2023   ANIONGAP 11 04/15/2023   EGFR 35 (L) 06/30/2022   GFR 69.94 06/26/2017   Lab Results  Component Value Date   CHOL 103 10/31/2022   Lab Results  Component Value Date   HDL 52 10/31/2022   Lab Results  Component Value Date   LDLCALC 20 10/31/2022   Lab Results  Component Value  Date   TRIG 198 (H) 10/31/2022   Lab Results  Component Value Date   CHOLHDL 2.0 10/31/2022   Lab Results  Component Value Date   HGBA1C 6.2 (H) 04/26/2022      Assessment & Plan:  Panic attacks Assessment & Plan: The patient will be initiated on Zoloft 25 mg to be taken daily. She was encouraged to take the medication at the same time each day, with or without food. It was explained that it may take at least 2 weeks to notice some benefits and 6-8 weeks for the full therapeutic effect. She was advised not to abruptly discontinue the medication to avoid withdrawal symptoms.  For severe panic attacks, the patient was encouraged to take hydralazine 25 mg as needed, every 8 hours. Additionally, hydroxyzine was recommended to be taken 30 to 60 minutes before bedtime to assist with sleep.  The patient will follow up with her primary care provider in 6 weeks to assess the effectiveness of the medication and adjust the dose as needed.  Orders: -     Sertraline HCl; Take 1 tablet (25 mg total) by mouth daily.  Dispense: 30 tablet; Refill: 3 -     hydrOXYzine Pamoate; Take 1 capsule (25 mg total) by mouth every 8 (eight) hours as needed.  Dispense: 30 capsule; Refill: 1 -     Ambulatory referral to Psychiatry  Multiple pulmonary nodules determined by computed tomography of lung Assessment & Plan: Multiple atypical multilocular pulmonary cysts have been identified in the left upper lobe, which have shown an increase in size since 05/10/2019. A referral to pulmonology has been placed for further  evaluation to assess the clinical significance and determine appropriate management. Additionally, a repeat CT scan has been scheduled for March 2025 to monitor for any further changes.  Orders: -     Ambulatory referral to Pulmonology -     CT CHEST WO CONTRAST; Future  Note: This chart has been completed using Engineer, civil (consulting) software, and while attempts have been made to ensure accuracy, certain words and phrases may not be transcribed as intended.    Follow-up: Return in about 6 weeks (around 06/01/2023) for panic attacks.   Gilmore Laroche, FNP

## 2023-04-19 NOTE — Progress Notes (Signed)
Spoke with patient and advised Erin [name] stated no changes are needed at this time.  Advised to continue with current dosages of Lasix and Potassium.  Reminded she has labs scheduled for 12/16.  Encouraged to call HF clinic if assistance is needed over the next 10 days since I will be out of the office.

## 2023-04-19 NOTE — Telephone Encounter (Signed)
ICM call to patient.  Requested manual remote transmission to recheck fluid levels as requested by [name], NP after last weeks OV.

## 2023-04-19 NOTE — Progress Notes (Signed)
Per secure message from [facility], NP no changes are needed at this time.

## 2023-04-20 ENCOUNTER — Ambulatory Visit (INDEPENDENT_AMBULATORY_CARE_PROVIDER_SITE_OTHER): Payer: Medicare PPO | Admitting: Family Medicine

## 2023-04-20 VITALS — BP 96/66 | HR 90 | Ht 60.0 in | Wt 108.1 lb

## 2023-04-20 DIAGNOSIS — F410 Panic disorder [episodic paroxysmal anxiety] without agoraphobia: Secondary | ICD-10-CM

## 2023-04-20 DIAGNOSIS — R918 Other nonspecific abnormal finding of lung field: Secondary | ICD-10-CM

## 2023-04-20 MED ORDER — HYDROXYZINE PAMOATE 25 MG PO CAPS: 25.0000 mg | ORAL_CAPSULE | Freq: Three times a day (TID) | ORAL | 1 refills | Status: DC | PRN

## 2023-04-20 MED ORDER — SERTRALINE HCL 25 MG PO TABS: 25.0000 mg | ORAL_TABLET | Freq: Every day | ORAL | 3 refills | Status: DC

## 2023-04-20 NOTE — Patient Instructions (Addendum)
I appreciate the opportunity to provide care to you today!    Follow up:  6 weeks with pcp  Panic Attacks: I have started you on Zoloft 25 mg to take daily. Take it at the same time each day, with or without food. It may take at least 2 weeks to feel some benefit, and 6-8 weeks for the full effect. Do not abruptly discontinue the medication to avoid withdrawal symptoms. Your prescription for hydralazine 25 mg (to take every 8 hours as needed for severe panic attacks and anxiety) has been sent to your pharmacy. You may also take hydroxyzine 30 to 60 minutes before bedtime to help with sleep.  Nonpharmacologic management of anxiety   Mindfulness and Meditation Practices like mindfulness meditation can help reduce symptoms by promoting relaxation and present-moment awareness.  Exercise  Regular physical activity has been shown to improve mood and reduce anxiety through the release of endorphins and other neurochemicals.  Healthy Diet Eating a balanced diet rich in fruits, vegetables, whole grains, and lean proteins can support overall mental health.  Sleep Hygiene  Establishing a regular sleep routine and ensuring good sleep quality can significantly impact mood and anxiety levels.  Stress Management Techniques Activities such as yoga, tai chi, and deep breathing exercises can help manage stress.  Social Support Maintaining strong relationships and seeking support from friends, family, or support groups can provide emotional comfort and reduce feelings of isolation.  Lifestyle Modifications Reducing alcohol and caffeine intake, quitting smoking, and avoiding recreational drugs can improve symptoms.  Art and Music Therapy Engaging in creative activities like painting, drawing, or playing music can be therapeutic and help express emotions.  Light Therapy Particularly useful for seasonal affective disorder (SAD), exposure to bright light can help regulate mood. [name] Kitchen   Referrals today-  Psychiatry   Attached with your AVS, you will find valuable resources for self-education. I highly recommend dedicating some time to thoroughly examine them.   Please continue to a heart-healthy diet and increase your physical activities. Try to exercise for at least five days a week.    It was a pleasure to see you and I look forward to continuing to work together on your health and well-being. Please do not hesitate to call the office if you need care or have questions about your care.  In case of emergency, please visit the Emergency Department for urgent care, or contact our clinic at [phone] to schedule an appointment. We're here to help you!   Have a wonderful day and week. With Gratitude, Gilmore Laroche MSN, FNP-BC

## 2023-04-21 DIAGNOSIS — R911 Solitary pulmonary nodule: Secondary | ICD-10-CM | POA: Insufficient documentation

## 2023-04-21 DIAGNOSIS — F410 Panic disorder [episodic paroxysmal anxiety] without agoraphobia: Secondary | ICD-10-CM | POA: Insufficient documentation

## 2023-04-21 DIAGNOSIS — R918 Other nonspecific abnormal finding of lung field: Secondary | ICD-10-CM | POA: Insufficient documentation

## 2023-04-21 NOTE — Assessment & Plan Note (Signed)
Multiple atypical multilocular pulmonary cysts have been identified in the left upper lobe, which have shown an increase in size since 05/10/2019. A referral to pulmonology has been placed for further evaluation to assess the clinical significance and determine appropriate management. Additionally, a repeat CT scan has been scheduled for March 2025 to monitor for any further changes.

## 2023-04-21 NOTE — Assessment & Plan Note (Signed)
The patient will be initiated on Zoloft 25 mg to be taken daily. She was encouraged to take the medication at the same time each day, with or without food. It was explained that it may take at least 2 weeks to notice some benefits and 6-8 weeks for the full therapeutic effect. She was advised not to abruptly discontinue the medication to avoid withdrawal symptoms.  For severe panic attacks, the patient was encouraged to take hydralazine 25 mg as needed, every 8 hours. Additionally, hydroxyzine was recommended to be taken 30 to 60 minutes before bedtime to assist with sleep.  The patient will follow up with her primary care provider in 6 weeks to assess the effectiveness of the medication and adjust the dose as needed.

## 2023-04-21 NOTE — Addendum Note (Signed)
Addended byGilmore Laroche on: 04/21/2023 03:58 PM   Modules accepted: Orders, Level of Service

## 2023-04-24 ENCOUNTER — Ambulatory Visit (HOSPITAL_COMMUNITY): Payer: Medicare PPO | Admitting: Occupational Therapy

## 2023-04-24 ENCOUNTER — Encounter: Payer: Self-pay | Admitting: Internal Medicine

## 2023-04-24 ENCOUNTER — Encounter (HOSPITAL_COMMUNITY): Payer: Self-pay | Admitting: Occupational Therapy

## 2023-04-24 ENCOUNTER — Ambulatory Visit (HOSPITAL_COMMUNITY)
Admission: RE | Admit: 2023-04-24 | Discharge: 2023-04-24 | Disposition: A | Discharge status: Home / Self Care | Payer: Medicare PPO | Source: Ambulatory Visit | Attending: Cardiology | Admitting: Cardiology

## 2023-04-24 DIAGNOSIS — R29898 Other symptoms and signs involving the musculoskeletal system: Secondary | ICD-10-CM

## 2023-04-24 DIAGNOSIS — M19012 Primary osteoarthritis, left shoulder: Secondary | ICD-10-CM | POA: Diagnosis not present

## 2023-04-24 DIAGNOSIS — M25612 Stiffness of left shoulder, not elsewhere classified: Secondary | ICD-10-CM | POA: Diagnosis not present

## 2023-04-24 DIAGNOSIS — I5022 Chronic systolic (congestive) heart failure: Secondary | ICD-10-CM | POA: Diagnosis not present

## 2023-04-24 DIAGNOSIS — M25512 Pain in left shoulder: Secondary | ICD-10-CM | POA: Diagnosis not present

## 2023-04-24 DIAGNOSIS — Z95810 Presence of automatic (implantable) cardiac defibrillator: Secondary | ICD-10-CM | POA: Diagnosis not present

## 2023-04-24 DIAGNOSIS — Z96612 Presence of left artificial shoulder joint: Secondary | ICD-10-CM | POA: Diagnosis not present

## 2023-04-24 LAB — BASIC METABOLIC PANEL
Anion gap: 10 (ref 5–15)
BUN: 12 mg/dL (ref 8–23)
CO2: 27 mmol/L (ref 22–32)
Calcium: 9.6 mg/dL (ref 8.9–10.3)
Chloride: 100 mmol/L (ref 98–111)
Creatinine, Ser: 1.31 mg/dL — ABNORMAL HIGH (ref 0.44–1.00)
GFR, Estimated: 46 mL/min — ABNORMAL LOW (ref >60)
Glucose, Bld: 111 mg/dL — ABNORMAL HIGH (ref 70–99)
Potassium: 3.1 mmol/L — ABNORMAL LOW (ref 3.5–5.1)
Sodium: 137 mmol/L (ref 135–145)

## 2023-04-24 NOTE — Therapy (Signed)
OUTPATIENT OCCUPATIONAL THERAPY ORTHO TREATMENT NOTE  Patient Name: Erin [name] MRN: [id number] DOB:20-Sep-1960, 62 y.o., female Today's Date: 04/24/2023   END OF SESSION:  OT End of Session - 04/24/23 1400     Visit Number 3    Number of Visits 17    Date for OT Re-Evaluation 06/09/23    Authorization Type [employer]    OT Start Time 1307    OT Stop Time 1346    OT Time Calculation (min) 39 min    Activity Tolerance Patient tolerated treatment well    Behavior During Therapy WFL for tasks assessed/performed             Past Medical History:  Diagnosis Date   AICD (automatic cardioverter/defibrillator) present    Allergic reaction to alpha-gal    Allergy 03/05/2022   Anemia    Anxiety state 03/25/2016   Arthritis    Basal cell carcinoma of forehead    Brain aneurysm    CHF (congestive heart failure) (HCC)    Coronary artery disease    a. 03/11/16 PCI with DES-->Prox/Mid Cx;  b. 03/14/16 PCI with DES x2-->RCA, EF 30-35%.   Depression 06/09/2010   Encounter for general adult medical examination with abnormal findings 05/04/2022   Essential hypertension    Hx   GERD (gastroesophageal reflux disease)    HFrEF (heart failure with reduced ejection fraction) (HCC)    a. 10/2016 Echo: EF 35-40%, Gr1 DD, mild focal basal septal hypertrophy, basal inflat, mid inflat, basal antlat AK. Mid infept/inf/antlat, apical lateral sev HK. Mod MR. mildly reduced RV fxn. Mild TR.   History of pneumonia    Hyperlipidemia    IBS (irritable bowel syndrome)    Ischemic cardiomyopathy    a. 10/2016 Echo: EF 35-40%, Gr1 DD.   Mitral regurgitation    Neuromuscular disorder (HCC)    NSTEMI (non-ST elevated myocardial infarction) (HCC) 03/10/2016   Pneumonia 03/2016   Shingles    Squamous cell cancer of skin of nose    Thrombocytosis 03/26/2016   Tobacco abuse    Trichimoniasis    Wears dentures    Wears glasses    Past Surgical History:  Procedure Laterality Date    APPENDECTOMY     BICEPT TENODESIS Left 03/21/2023   Procedure: BICEPS TENODESIS;  Surgeon: Cammy Copa, MD;  Location: [facility] OR;  Service: Orthopedics;  Laterality: Left;   BIOPSY  09/20/2018   Procedure: BIOPSY;  Surgeon: Corbin Ade, MD;  Location: [facility];  Service: Endoscopy;;  colon   BIOPSY  01/05/2021   Procedure: BIOPSY;  Surgeon: Rachael Fee, MD;  Location: [facility] ENDOSCOPY;  Service: Endoscopy;;   BIOPSY  04/27/2022   Procedure: BIOPSY;  Surgeon: Lanelle Bal, DO;  Location: [facility];  Service: Endoscopy;;   CARDIAC CATHETERIZATION N/A 03/11/2016   Procedure: Left Heart Cath and Coronary Angiography;  Surgeon: Marykay Lex, MD;  Location: [facility] INVASIVE CV LAB;  Service: Cardiovascular;  Laterality: N/A;   CARDIAC CATHETERIZATION N/A 03/11/2016   Procedure: Coronary Stent Intervention;  Surgeon: Marykay Lex, MD;  Location: [facility] INVASIVE CV LAB;  Service: Cardiovascular;  Laterality: N/A;   CARDIAC CATHETERIZATION N/A 03/14/2016   Procedure: Coronary Stent Intervention;  Surgeon: Peter M Swaziland, MD;  Location: [facility] INVASIVE CV LAB;  Service: Cardiovascular;  Laterality: N/A;   CEREBRAL ANEURYSM REPAIR  04/2019   stent placed   CHOLECYSTECTOMY OPEN  1984   COLONOSCOPY WITH PROPOFOL N/A 09/20/2018   Procedure: COLONOSCOPY  WITH PROPOFOL;  Surgeon: Corbin Ade, MD;  Location: [facility];  Service: Endoscopy;  Laterality: N/A;  10:30am   CORONARY ANGIOPLASTY WITH STENT PLACEMENT  03/14/2016   ESOPHAGOGASTRODUODENOSCOPY (EGD) WITH PROPOFOL N/A 09/20/2018   Procedure: ESOPHAGOGASTRODUODENOSCOPY (EGD) WITH PROPOFOL;  Surgeon: Corbin Ade, MD;  Location: [facility];  Service: Endoscopy;  Laterality: N/A;   ESOPHAGOGASTRODUODENOSCOPY (EGD) WITH PROPOFOL N/A 01/05/2021   Procedure: ESOPHAGOGASTRODUODENOSCOPY (EGD) WITH PROPOFOL;  Surgeon: Rachael Fee, MD;  Location: [facility] ENDOSCOPY;  Service: Endoscopy;  Laterality: N/A;   ESOPHAGOGASTRODUODENOSCOPY  (EGD) WITH PROPOFOL N/A 04/27/2022   Procedure: ESOPHAGOGASTRODUODENOSCOPY (EGD) WITH PROPOFOL;  Surgeon: Lanelle Bal, DO;  Location: [facility];  Service: Endoscopy;  Laterality: N/A;  9:15am, asa 3/4, ASAP   ESOPHAGOGASTRODUODENOSCOPY (EGD) WITH PROPOFOL N/A 01/04/2023   Procedure: ESOPHAGOGASTRODUODENOSCOPY (EGD) WITH PROPOFOL;  Surgeon: Corbin Ade, MD;  Location: [facility];  Service: Endoscopy;  Laterality: N/A;  130pm, asa 3   FINGER ARTHROPLASTY Left 05/14/2013   Procedure: LEFT THUMB CARPAL METACARPAL ARTHROPLASTY;  Surgeon: Tami Ribas, MD;  Location:  SURGERY CENTER;  Service: Orthopedics;  Laterality: Left;   ICD IMPLANT N/A 04/03/2020   Procedure: ICD IMPLANT;  Surgeon: Regan Lemming, MD;  Location: [facility] INVASIVE CV LAB;  Service: Cardiovascular;  Laterality: N/A;   IR ANGIO INTRA EXTRACRAN SEL COM CAROTID INNOMINATE BILAT MOD SED  01/05/2017   IR ANGIO INTRA EXTRACRAN SEL COM CAROTID INNOMINATE BILAT MOD SED  03/19/2019   IR ANGIO INTRA EXTRACRAN SEL COM CAROTID INNOMINATE BILAT MOD SED  06/04/2020   IR ANGIO INTRA EXTRACRAN SEL INTERNAL CAROTID UNI L MOD SED  04/15/2019   IR ANGIO VERTEBRAL SEL VERTEBRAL BILAT MOD SED  01/05/2017   IR ANGIO VERTEBRAL SEL VERTEBRAL BILAT MOD SED  03/19/2019   IR ANGIO VERTEBRAL SEL VERTEBRAL UNI L MOD SED  06/04/2020   IR ANGIOGRAM FOLLOW UP STUDY  04/15/2019   IR RADIOLOGIST EVAL & MGMT  12/30/2016   IR TRANSCATH/EMBOLIZ  04/15/2019   IR US GUIDE VASC ACCESS RIGHT  03/19/2019   IR US GUIDE VASC ACCESS RIGHT  06/04/2020   MALONEY DILATION N/A 09/20/2018   Procedure: Elease Hashimoto DILATION;  Surgeon: Corbin Ade, MD;  Location: [facility];  Service: Endoscopy;  Laterality: N/A;   MALONEY DILATION N/A 01/04/2023   Procedure: Elease Hashimoto DILATION;  Surgeon: Corbin Ade, MD;  Location: [facility];  Service: Endoscopy;  Laterality: N/A;   RADIOLOGY WITH ANESTHESIA N/A 04/15/2019   Procedure: Sharman Crate;   Surgeon: Julieanne Cotton, MD;  Location: [facility] OR;  Service: Radiology;  Laterality: N/A;   REVERSE SHOULDER ARTHROPLASTY Left 03/21/2023   Procedure: LEFT REVERSE SHOULDER ARTHROPLASTY;  Surgeon: Cammy Copa, MD;  Location: [facility] OR;  Service: Orthopedics;  Laterality: Left;   RIGHT/LEFT HEART CATH AND CORONARY ANGIOGRAPHY N/A 08/19/2019   Procedure: RIGHT/LEFT HEART CATH AND CORONARY ANGIOGRAPHY;  Surgeon: Laurey Morale, MD;  Location: [facility] INVASIVE CV LAB;  Service: Cardiovascular;  Laterality: N/A;   TUBAL LIGATION  1987   VAGINAL HYSTERECTOMY  2009   Patient Active Problem List   Diagnosis Date Noted   Panic attacks 04/21/2023   Multiple pulmonary nodules determined by computed tomography of lung 04/21/2023   Arthritis of left shoulder region 04/02/2023   Biceps tendonitis on left 04/02/2023   OA (osteoarthritis) of shoulder 03/21/2023   S/P reverse total shoulder arthroplasty, left 03/21/2023   RUQ pain 06/29/2022   Pancreatic cyst 06/29/2022  Peripheral neuropathy 05/04/2022   Restless leg syndrome 05/04/2022   Hypertriglyceridemia 05/04/2022   Bilateral shoulder pain 05/04/2022   Encounter for general adult medical examination with abnormal findings 05/04/2022   H/O total hysterectomy 04/26/2022   Odynophagia 04/20/2022   Abdominal pain, epigastric 04/20/2022   Nausea without vomiting 06/16/2021   Allergy to alpha-gal 06/16/2021   Orthostatic hypotension 06/16/2021   Hypocortisolemia (HCC) 03/02/2021   Adrenal adenoma, left 03/02/2021   AKI (acute kidney injury) (HCC) 01/04/2021   Intractable nausea and vomiting 01/03/2021   Left-sided weakness 10/19/2020   Cough 02/11/2020   Near syncope 12/10/2019   Brain aneurysm 04/15/2019   Dysphagia 02/27/2018   Encounter for screening colonoscopy 02/27/2018   History of Clostridium difficile infection 02/27/2018   Chronic diarrhea 12/20/2017   Chronic combined systolic and diastolic congestive heart failure (HCC)  06/20/2016   Hypokalemia due to excessive gastrointestinal loss of potassium    Acute CHF (congestive heart failure) (HCC) 05/16/2016   Acute on chronic systolic CHF (congestive heart failure) (HCC) 05/16/2016   Acute respiratory failure with hypoxia (HCC)    Thrombocytosis 03/26/2016   Cardiomyopathy, ischemic 03/25/2016   Chronic combined systolic and diastolic heart failure (HCC) 03/25/2016   Anxiety state 03/25/2016   Troponin level elevated 03/25/2016   Coronary artery disease involving native coronary artery of native heart 03/25/2016   Normocytic anemia 03/25/2016   SOB (shortness of breath) 03/24/2016   Lightheadedness 03/17/2016   Hypotension 03/17/2016   Tobacco abuse 03/12/2016   NSTEMI (non-ST elevated myocardial infarction) (HCC) 03/11/2016   Atypical chest pain    Essential hypertension 09/06/2015   Hyperlipidemia 09/06/2015   GERD without esophagitis 09/06/2015   Chest pain 09/06/2015    PCP: Christel Mormon, MD REFERRING PROVIDER: Lenice Pressman, MD  ONSET DATE: 03/21/23  REFERRING DIAG: L Reverse Total Shoulder  THERAPY DIAG:  Acute pain of left shoulder  Stiffness of left shoulder, not elsewhere classified  Other symptoms and signs involving the musculoskeletal system  Rationale for Evaluation and Treatment: Rehabilitation  SUBJECTIVE:   SUBJECTIVE STATEMENT: "It's just a little painful" Pt accompanied by: self  PERTINENT HISTORY: Pt reports L shoulder pain for ~7 years with worsening pain. This past year she reports limiting ability to cook, clean, and complete certain parts of dressing and bathing. On 03/21/23, pt s/p L Reverse Total Shoulder.   PRECAUTIONS: Shoulder  WEIGHT BEARING RESTRICTIONS: Yes >1lb  PAIN:  Are you having pain? Yes: NPRS scale: 2/10 Pain location: Deltoid Pain description: dull ache Aggravating factors: Cold, movement Relieving factors: medication  FALLS: Has patient fallen in last 6 months? No  PLOF:  Independent  PATIENT GOALS: "To be able to fasten my own bra"  NEXT MD VISIT: 05/05/23  OBJECTIVE:   HAND DOMINANCE: Right  ADLs: Overall ADLs: Pt reports inability to clasp her bra, put her hair up, or wash her hair. Due to precautions and pain/ROM limits she is unable to lift and carry any items heavier than her cell phone.   FUNCTIONAL OUTCOME MEASURES: FOTO: 42.21  UPPER EXTREMITY ROM:       Assessed in supine, er/IR adducted  Passive ROM Left eval  Shoulder flexion 99  Shoulder abduction 78  Shoulder internal rotation 90  Shoulder external rotation 24  (Blank rows = not tested)    UPPER EXTREMITY MMT:     Assessed in seated, er/IR adducted  MMT Left eval  Shoulder flexion   Shoulder abduction   Shoulder internal rotation   Shoulder external  rotation   Middle trapezius   Lower trapezius   (Blank rows = not tested)  SENSATION: Mild numbness and tingling noted intermittently in finger tips and elbow  EDEMA: no swelling noted  OBSERVATIONS: Moderate fascial restrictions    TODAY'S TREATMENT:                                                                                                                              DATE:   04/24/23 -manual therapy: myofascial release and trigger point applied to biceps, trapezius, deltoid, and scapular region for improving pain and fascial restrictions, as well as ROM -AA/ROM: supine, flexion, abduction, protraction, horizontal abduction, er/IR x12 -A/ROM: supine, flexion, abduction, protraction, horizontal abduction, er/IR, x8 -Thumb tacs, x60" -Wall Climbs, flexion, x10 -Pulleys: flexion, abduction, x60"  04/17/23 -manual therapy: myofascial release and trigger point applied to biceps, trapezius, deltoid, and scapular region for improving pain and fascial restrictions, as well as ROM -P/ROM: supine, flexion, abduction, er/IR, x10 -AA/ROM: supine, flexion, abduction, protraction, x8 -Low Level Wall Wash, x60" -Thumb  tacs, x60" -Pendulums, 2x30"   PATIENT EDUCATION: Education details: AA/ROM Person educated: Patient Education method: Programmer, multimedia, Demonstration, and Handouts Education comprehension: verbalized understanding and returned demonstration  HOME EXERCISE PROGRAM: 12/2: Table Slides 12/9: Pendulums 12/16: AA/ROM  GOALS: Goals reviewed with patient? Yes   SHORT TERM GOALS: Target date: 05/08/23  Pt will be provided with and educated on HEP to improve mobility in LUE required for use during ADL completion.   Goal status: IN PROGRESS  2.  Pt will increase LUE P/ROM by 30 degrees to improve ability to use LUE during dressing tasks with minimal compensatory techniques.   Goal status: IN PROGRESS  3.  Pt will increase LUE strength to 3+/5 to improve ability to reach for items at waist to chest height during bathing and grooming tasks.   Goal status: IN PROGRESS   LONG TERM GOALS: Target date: 06/09/23  Pt will decrease pain in LUE to 3/10 or less to improve ability to sleep for 2+ consecutive hours without waking due to pain.   Goal status: IN PROGRESS  2.  Pt will decrease LUE fascial restrictions to min amounts or less to improve mobility required for functional reaching tasks.   Goal status: IN PROGRESS  3.  Pt will increase LUE A/ROM by 45 degrees to improve ability to use LUE when reaching overhead or behind back during dressing and bathing tasks.   Goal status: IN PROGRESS  4.  Pt will increase LUE strength to 4+/5 or greater to improve ability to use LUE when lifting or carrying items during meal preparation/housework/yardwork tasks.   Goal status: IN PROGRESS  5.  Pt will return to highest level of function using LUE as non-dominant during functional task completion.   Goal status: IN PROGRESS   ASSESSMENT:  CLINICAL IMPRESSION: Pt continuing to have lower pain levels and demonstrating good ROM for ~4 weeks s/p surgery. With AA/ROM, pt  able to achieve 80-85%  of full ROM with minimal pain. Pt started on A/ROM this session, where she continues to demonstrate improving ROM, however her elbow continues to flex, limiting ROM. Verbal and tactile cuing provided for positioning and technique.   PERFORMANCE DEFICITS: in functional skills including in functional skills including ADLs, IADLs, coordination, tone, ROM, strength, pain, fascial restrictions, muscle spasms, and UE functional use.   PLAN:  OT FREQUENCY: 2x/week  OT DURATION: 8 weeks  PLANNED INTERVENTIONS: 97168 OT Re-evaluation, 97535 self care/ADL training, 40981 therapeutic exercise, 97530 therapeutic activity, 97140 manual therapy, 97035 ultrasound, 97010 moist heat, 97032 electrical stimulation (manual), passive range of motion, energy conservation, coping strategies training, patient/family education, and DME and/or AE instructions  RECOMMENDED OTHER SERVICES: N/A  CONSULTED AND AGREED WITH PLAN OF CARE: Patient  PLAN FOR NEXT SESSION: Manual Therapy, P/ROM, Table Slides, Thumb Tacs, Pulleys   Jenette Rayson Bing Plume, OTR/L [facility] [phone] Kennyth Arnold, OT 04/24/2023, 2:01 PM

## 2023-04-24 NOTE — Patient Instructions (Signed)

## 2023-04-25 ENCOUNTER — Telehealth (HOSPITAL_COMMUNITY): Payer: Self-pay

## 2023-04-25 DIAGNOSIS — I5022 Chronic systolic (congestive) heart failure: Secondary | ICD-10-CM

## 2023-04-25 MED ORDER — POTASSIUM CHLORIDE CRYS ER 20 MEQ PO TBCR: 40.0000 meq | EXTENDED_RELEASE_TABLET | Freq: Two times a day (BID) | ORAL | 6 refills | Status: DC

## 2023-04-25 NOTE — Telephone Encounter (Signed)
Patient's Kcl medication has been sent to her pharmacy. In addition, pt's labs placed and appointment scheduled.Pt aware, agreeable, and verbalized understanding.

## 2023-04-25 NOTE — Telephone Encounter (Signed)
-----   Message from Jacklynn Ganong sent at 04/25/2023 12:33 PM EST ----- Restart KCL at 40 bid. Repeat BMET in 1 week ----- Message ----- From: Faythe Casa, CMA Sent: 04/25/2023  12:24 PM EST To: Jacklynn Ganong, FNP  Called patient and she has not taken her kcl med since Friday due to some GI issues and will start back taking it today.

## 2023-04-26 ENCOUNTER — Encounter (HOSPITAL_COMMUNITY): Payer: Medicare PPO | Admitting: Occupational Therapy

## 2023-04-28 ENCOUNTER — Ambulatory Visit (HOSPITAL_COMMUNITY)
Admission: RE | Admit: 2023-04-28 | Discharge: 2023-04-28 | Disposition: A | Discharge status: Home / Self Care | Payer: Medicare PPO | Source: Ambulatory Visit | Attending: Cardiology | Admitting: Cardiology

## 2023-04-28 ENCOUNTER — Ambulatory Visit (HOSPITAL_COMMUNITY)
Admission: RE | Admit: 2023-04-28 | Discharge: 2023-04-28 | Disposition: A | Discharge status: Home / Self Care | Payer: Medicare PPO | Source: Ambulatory Visit | Attending: Family Medicine | Admitting: Family Medicine

## 2023-04-28 DIAGNOSIS — I6529 Occlusion and stenosis of unspecified carotid artery: Secondary | ICD-10-CM | POA: Insufficient documentation

## 2023-04-28 DIAGNOSIS — R918 Other nonspecific abnormal finding of lung field: Secondary | ICD-10-CM | POA: Insufficient documentation

## 2023-04-28 DIAGNOSIS — D3502 Benign neoplasm of left adrenal gland: Secondary | ICD-10-CM | POA: Diagnosis not present

## 2023-04-28 DIAGNOSIS — I6522 Occlusion and stenosis of left carotid artery: Secondary | ICD-10-CM | POA: Insufficient documentation

## 2023-04-28 DIAGNOSIS — D3501 Benign neoplasm of right adrenal gland: Secondary | ICD-10-CM | POA: Diagnosis not present

## 2023-04-28 DIAGNOSIS — I700 Atherosclerosis of aorta: Secondary | ICD-10-CM | POA: Diagnosis not present

## 2023-05-01 ENCOUNTER — Encounter (HOSPITAL_COMMUNITY): Payer: Medicare PPO | Admitting: Occupational Therapy

## 2023-05-04 ENCOUNTER — Ambulatory Visit (HOSPITAL_COMMUNITY)
Admission: RE | Admit: 2023-05-04 | Discharge: 2023-05-04 | Disposition: A | Discharge status: Home / Self Care | Payer: Medicare PPO | Source: Ambulatory Visit | Attending: Cardiology | Admitting: Cardiology

## 2023-05-04 ENCOUNTER — Encounter (HOSPITAL_COMMUNITY): Payer: Medicare PPO | Admitting: Occupational Therapy

## 2023-05-04 DIAGNOSIS — I5022 Chronic systolic (congestive) heart failure: Secondary | ICD-10-CM | POA: Insufficient documentation

## 2023-05-04 LAB — BASIC METABOLIC PANEL
Anion gap: 10 (ref 5–15)
BUN: 14 mg/dL (ref 8–23)
CO2: 25 mmol/L (ref 22–32)
Calcium: 9.6 mg/dL (ref 8.9–10.3)
Chloride: 108 mmol/L (ref 98–111)
Creatinine, Ser: 1.72 mg/dL — ABNORMAL HIGH (ref 0.44–1.00)
GFR, Estimated: 33 mL/min — ABNORMAL LOW (ref >60)
Glucose, Bld: 95 mg/dL (ref 70–99)
Potassium: 4.3 mmol/L (ref 3.5–5.1)
Sodium: 143 mmol/L (ref 135–145)

## 2023-05-05 ENCOUNTER — Ambulatory Visit (INDEPENDENT_AMBULATORY_CARE_PROVIDER_SITE_OTHER): Payer: Medicare PPO | Admitting: Orthopedic Surgery

## 2023-05-05 DIAGNOSIS — Z96612 Presence of left artificial shoulder joint: Secondary | ICD-10-CM

## 2023-05-06 ENCOUNTER — Other Ambulatory Visit: Payer: Self-pay | Admitting: Gastroenterology

## 2023-05-06 ENCOUNTER — Other Ambulatory Visit: Payer: Self-pay | Admitting: Internal Medicine

## 2023-05-06 ENCOUNTER — Other Ambulatory Visit: Payer: Self-pay | Admitting: Surgical

## 2023-05-06 ENCOUNTER — Other Ambulatory Visit (HOSPITAL_COMMUNITY): Payer: Self-pay | Admitting: Cardiology

## 2023-05-06 ENCOUNTER — Encounter: Payer: Self-pay | Admitting: Orthopedic Surgery

## 2023-05-06 DIAGNOSIS — G6289 Other specified polyneuropathies: Secondary | ICD-10-CM

## 2023-05-06 NOTE — Progress Notes (Signed)
Post-Op Visit Note   Patient: Erin [name]           Date of Birth: Oct 29, 1960           MRN: [id number] Visit Date: 05/05/2023 PCP: Billie Lade, MD   Assessment & Plan:  Chief Complaint:  Chief Complaint  Patient presents with   Left Shoulder - Routine Post Op    left reverse shoulder arthroplasty on 03/21/2023   Visit Diagnoses: No diagnosis found.  Plan: [name] is a patient who is now about 6 weeks out left reverse shoulder replacement.  Doing well.  Doing therapy 2 times a week plus home exercise program.  Takes tramadol at night sometimes.  On examination she has range of motion of 30/90/120.  Pretty reasonable internal and external rotation strength.  Incision intact.  6-week return for final check.  Continue with stretching exercises and initiate some strengthening as well.  Follow-Up Instructions: No follow-ups on file.   Orders:  No orders of the defined types were placed in this encounter.  No orders of the defined types were placed in this encounter.   Imaging: No results found.  PMFS History: Patient Active Problem List   Diagnosis Date Noted   Panic attacks 04/21/2023   Multiple pulmonary nodules determined by computed tomography of lung 04/21/2023   Arthritis of left shoulder region 04/02/2023   Biceps tendonitis on left 04/02/2023   OA (osteoarthritis) of shoulder 03/21/2023   S/P reverse total shoulder arthroplasty, left 03/21/2023   RUQ pain 06/29/2022   Pancreatic cyst 06/29/2022   Peripheral neuropathy 05/04/2022   Restless leg syndrome 05/04/2022   Hypertriglyceridemia 05/04/2022   Bilateral shoulder pain 05/04/2022   Encounter for general adult medical examination with abnormal findings 05/04/2022   H/O total hysterectomy 04/26/2022   Odynophagia 04/20/2022   Abdominal pain, epigastric 04/20/2022   Nausea without vomiting 06/16/2021   Allergy to alpha-gal 06/16/2021   Orthostatic hypotension 06/16/2021   Hypocortisolemia (HCC)  03/02/2021   Adrenal adenoma, left 03/02/2021   AKI (acute kidney injury) (HCC) 01/04/2021   Intractable nausea and vomiting 01/03/2021   Left-sided weakness 10/19/2020   Cough 02/11/2020   Near syncope 12/10/2019   Brain aneurysm 04/15/2019   Dysphagia 02/27/2018   Encounter for screening colonoscopy 02/27/2018   History of Clostridium difficile infection 02/27/2018   Chronic diarrhea 12/20/2017   Chronic combined systolic and diastolic congestive heart failure (HCC) 06/20/2016   Hypokalemia due to excessive gastrointestinal loss of potassium    Acute CHF (congestive heart failure) (HCC) 05/16/2016   Acute on chronic systolic CHF (congestive heart failure) (HCC) 05/16/2016   Acute respiratory failure with hypoxia (HCC)    Thrombocytosis 03/26/2016   Cardiomyopathy, ischemic 03/25/2016   Chronic combined systolic and diastolic heart failure (HCC) 03/25/2016   Anxiety state 03/25/2016   Troponin level elevated 03/25/2016   Coronary artery disease involving native coronary artery of native heart 03/25/2016   Normocytic anemia 03/25/2016   SOB (shortness of breath) 03/24/2016   Lightheadedness 03/17/2016   Hypotension 03/17/2016   Tobacco abuse 03/12/2016   NSTEMI (non-ST elevated myocardial infarction) (HCC) 03/11/2016   Atypical chest pain    Essential hypertension 09/06/2015   Hyperlipidemia 09/06/2015   GERD without esophagitis 09/06/2015   Chest pain 09/06/2015   Past Medical History:  Diagnosis Date   AICD (automatic cardioverter/defibrillator) present    Allergic reaction to alpha-gal    Allergy 03/05/2022   Anemia    Anxiety state 03/25/2016   Arthritis  Basal cell carcinoma of forehead    Brain aneurysm    CHF (congestive heart failure) (HCC)    Coronary artery disease    a. 03/11/16 PCI with DES-->Prox/Mid Cx;  b. 03/14/16 PCI with DES x2-->RCA, EF 30-35%.   Depression 06/09/2010   Encounter for general adult medical examination with abnormal findings  05/04/2022   Essential hypertension    Hx   GERD (gastroesophageal reflux disease)    HFrEF (heart failure with reduced ejection fraction) (HCC)    a. 10/2016 Echo: EF 35-40%, Gr1 DD, mild focal basal septal hypertrophy, basal inflat, mid inflat, basal antlat AK. Mid infept/inf/antlat, apical lateral sev HK. Mod MR. mildly reduced RV fxn. Mild TR.   History of pneumonia    Hyperlipidemia    IBS (irritable bowel syndrome)    Ischemic cardiomyopathy    a. 10/2016 Echo: EF 35-40%, Gr1 DD.   Mitral regurgitation    Neuromuscular disorder (HCC)    NSTEMI (non-ST elevated myocardial infarction) (HCC) 03/10/2016   Pneumonia 03/2016   Shingles    Squamous cell cancer of skin of nose    Thrombocytosis 03/26/2016   Tobacco abuse    Trichimoniasis    Wears dentures    Wears glasses     Family History  Problem Relation Age of Onset   Stroke Mother    Hypertension Mother    Diabetes Mother    Heart attack Mother    Heart attack Father    Diabetes Father    Hypertension Father    CAD Father    Colon polyps Father 63       pre-cancerous    Stroke Father    Dementia Father    Hyperlipidemia Father    Arthritis Father    COPD Father    Heart disease Father    Breast cancer Maternal Grandmother    Diabetes Maternal Grandmother    Cancer Maternal Grandfather        Tongue and esophageal   Anxiety disorder Daughter    Depression Daughter    Anxiety disorder Daughter    Heart failure Other    Cancer Paternal Grandmother    Colon cancer Neg Hx     Past Surgical History:  Procedure Laterality Date   APPENDECTOMY     BICEPT TENODESIS Left 03/21/2023   Procedure: BICEPS TENODESIS;  Surgeon: Cammy Copa, MD;  Location: [facility] OR;  Service: Orthopedics;  Laterality: Left;   BIOPSY  09/20/2018   Procedure: BIOPSY;  Surgeon: Corbin Ade, MD;  Location: [facility];  Service: Endoscopy;;  colon   BIOPSY  01/05/2021   Procedure: BIOPSY;  Surgeon: Rachael Fee, MD;  Location:  [facility] ENDOSCOPY;  Service: Endoscopy;;   BIOPSY  04/27/2022   Procedure: BIOPSY;  Surgeon: Lanelle Bal, DO;  Location: [facility];  Service: Endoscopy;;   CARDIAC CATHETERIZATION N/A 03/11/2016   Procedure: Left Heart Cath and Coronary Angiography;  Surgeon: Marykay Lex, MD;  Location: [facility] INVASIVE CV LAB;  Service: Cardiovascular;  Laterality: N/A;   CARDIAC CATHETERIZATION N/A 03/11/2016   Procedure: Coronary Stent Intervention;  Surgeon: Marykay Lex, MD;  Location: [facility] INVASIVE CV LAB;  Service: Cardiovascular;  Laterality: N/A;   CARDIAC CATHETERIZATION N/A 03/14/2016   Procedure: Coronary Stent Intervention;  Surgeon: Peter M Swaziland, MD;  Location: [facility] INVASIVE CV LAB;  Service: Cardiovascular;  Laterality: N/A;   CEREBRAL ANEURYSM REPAIR  04/2019   stent placed   CHOLECYSTECTOMY OPEN  1984   COLONOSCOPY  WITH PROPOFOL N/A 09/20/2018   Procedure: COLONOSCOPY WITH PROPOFOL;  Surgeon: Corbin Ade, MD;  [facility];  Service: Endoscopy;  Laterality: N/A;  10:30am   CORONARY ANGIOPLASTY WITH STENT PLACEMENT  03/14/2016   ESOPHAGOGASTRODUODENOSCOPY (EGD) WITH PROPOFOL N/A 09/20/2018   Procedure: ESOPHAGOGASTRODUODENOSCOPY (EGD) WITH PROPOFOL;  Surgeon: Corbin Ade, MD;  [facility];  Service: Endoscopy;  Laterality: N/A;   ESOPHAGOGASTRODUODENOSCOPY (EGD) WITH PROPOFOL N/A 01/05/2021   Procedure: ESOPHAGOGASTRODUODENOSCOPY (EGD) WITH PROPOFOL;  Surgeon: Rachael Fee, MD;  Location: [facility] ENDOSCOPY;  Service: Endoscopy;  Laterality: N/A;   ESOPHAGOGASTRODUODENOSCOPY (EGD) WITH PROPOFOL N/A 04/27/2022   Procedure: ESOPHAGOGASTRODUODENOSCOPY (EGD) WITH PROPOFOL;  Surgeon: Lanelle Bal, DO;  [facility];  Service: Endoscopy;  Laterality: N/A;  9:15am, asa 3/4, ASAP   ESOPHAGOGASTRODUODENOSCOPY (EGD) WITH PROPOFOL N/A 01/04/2023   Procedure: ESOPHAGOGASTRODUODENOSCOPY (EGD) WITH PROPOFOL;  Surgeon: Corbin Ade, MD;  [facility];  Service: Endoscopy;  Laterality: N/A;  130pm, asa 3   FINGER ARTHROPLASTY Left 05/14/2013   Procedure: LEFT THUMB CARPAL METACARPAL ARTHROPLASTY;  Surgeon: Tami Ribas, MD;  Location: [facility];  Service: Orthopedics;  Laterality: Left;   ICD IMPLANT N/A 04/03/2020   Procedure: ICD IMPLANT;  Surgeon: Regan Lemming, MD;  Location: [facility] INVASIVE CV LAB;  Service: Cardiovascular;  Laterality: N/A;   IR ANGIO INTRA EXTRACRAN SEL COM CAROTID INNOMINATE BILAT MOD SED  01/05/2017   IR ANGIO INTRA EXTRACRAN SEL COM CAROTID INNOMINATE BILAT MOD SED  03/19/2019   IR ANGIO INTRA EXTRACRAN SEL COM CAROTID INNOMINATE BILAT MOD SED  06/04/2020   IR ANGIO INTRA EXTRACRAN SEL INTERNAL CAROTID UNI L MOD SED  04/15/2019   IR ANGIO VERTEBRAL SEL VERTEBRAL BILAT MOD SED  01/05/2017   IR ANGIO VERTEBRAL SEL VERTEBRAL BILAT MOD SED  03/19/2019   IR ANGIO VERTEBRAL SEL VERTEBRAL UNI L MOD SED  06/04/2020   IR ANGIOGRAM FOLLOW UP STUDY  04/15/2019   IR RADIOLOGIST EVAL & MGMT  12/30/2016   IR TRANSCATH/EMBOLIZ  04/15/2019   IR US GUIDE VASC ACCESS RIGHT  03/19/2019   IR US GUIDE VASC ACCESS RIGHT  06/04/2020   MALONEY DILATION N/A 09/20/2018   Procedure: Elease Hashimoto DILATION;  Surgeon: Corbin Ade, MD;  [facility];  Service: Endoscopy;  Laterality: N/A;   MALONEY DILATION N/A 01/04/2023   Procedure: Elease Hashimoto DILATION;  Surgeon: Corbin Ade, MD;  Location[facility];  Service: Endoscopy;  Laterality: N/A;   RADIOLOGY WITH ANESTHESIA N/A 04/15/2019   Procedure: Sharman Crate;  Surgeon: Julieanne Cotton, MD;  Location: [facility] OR;  Service: Radiology;  Laterality: N/A;   REVERSE SHOULDER ARTHROPLASTY Left 03/21/2023   Procedure: LEFT REVERSE SHOULDER ARTHROPLASTY;  Surgeon: Cammy Copa, MD;  Location: [facility] OR;  Service: Orthopedics;  Laterality: Left;   RIGHT/LEFT HEART CATH AND CORONARY ANGIOGRAPHY N/A 08/19/2019   Procedure: RIGHT/LEFT HEART CATH AND CORONARY  ANGIOGRAPHY;  Surgeon: Laurey Morale, MD;  Location: [facility] INVASIVE CV LAB;  Service: Cardiovascular;  Laterality: N/A;   TUBAL LIGATION  1987   VAGINAL HYSTERECTOMY  2009   Social History   Occupational History   Occupation: CNA  Tobacco Use   Smoking status: Some Days    Current packs/day: 0.25    Average packs/day: 0.3 packs/day for 15.0 years (3.8 ttl pk-yrs)    Types: Cigarettes   Smokeless tobacco: Never   Tobacco comments:    smokes a cigarette occasionally  Vaping Use   Vaping status: Never Used  Substance and Sexual Activity   Alcohol use: Not Currently    Comment: occasionally   Drug use: Yes    Types: Marijuana   Sexual activity: Not Currently    Birth control/protection: Surgical    Comment: hyst

## 2023-05-08 ENCOUNTER — Ambulatory Visit (HOSPITAL_COMMUNITY): Payer: Medicare PPO | Admitting: Occupational Therapy

## 2023-05-08 ENCOUNTER — Telehealth: Payer: Self-pay

## 2023-05-08 ENCOUNTER — Encounter (HOSPITAL_COMMUNITY): Payer: Self-pay | Admitting: Occupational Therapy

## 2023-05-08 DIAGNOSIS — R29898 Other symptoms and signs involving the musculoskeletal system: Secondary | ICD-10-CM | POA: Diagnosis not present

## 2023-05-08 DIAGNOSIS — Z96612 Presence of left artificial shoulder joint: Secondary | ICD-10-CM | POA: Diagnosis not present

## 2023-05-08 DIAGNOSIS — M25512 Pain in left shoulder: Secondary | ICD-10-CM

## 2023-05-08 DIAGNOSIS — I5022 Chronic systolic (congestive) heart failure: Secondary | ICD-10-CM | POA: Diagnosis not present

## 2023-05-08 DIAGNOSIS — M25612 Stiffness of left shoulder, not elsewhere classified: Secondary | ICD-10-CM

## 2023-05-08 DIAGNOSIS — Z95810 Presence of automatic (implantable) cardiac defibrillator: Secondary | ICD-10-CM | POA: Diagnosis not present

## 2023-05-08 DIAGNOSIS — M19012 Primary osteoarthritis, left shoulder: Secondary | ICD-10-CM | POA: Diagnosis not present

## 2023-05-08 NOTE — Patient Instructions (Signed)
1) ROM: Abduction (Standing)   Bring arms straight out from sides and raise as high as possible without pain. Repeat ____ times per set. Do ____ sets per session. Do ____ sessions per day.  http://orth.exer.us/910   Copyright  VHI. All rights reserved.   2) Extension (Active) ROM: Extension (Standing)   Bring arms straight back as far as possible without pain. Repeat ____ times per set. Do ____ sets per session. Do ____ sessions per day.  http://orth.exer.us/916   Copyright  VHI. All rights reserved.   3) ROM: External / Internal Rotation - in Abduction (Standing)   With upper arms parallel to floor and elbows bent at right angles, gently rotate arms up then down as far as possible without pain. Repeat ____ times per set. Do ____ sets per session. Do ____ sessions per day.  http://orth.exer.us/912   Copyright  VHI. All rights reserved.    4) Flexors Stretch (Active)   Stand, arms straight at sides. Bring arms straight forward and upward as high as possible without pain. Hold ___ seconds. Repeat ___ times per session. Do ___ sessions per day.  Copyright  VHI. All rights reserved.   5) Scapular Retraction (Standing)   With arms at sides, pinch shoulder blades together. Repeat ____ times per set. Do ____ sets per session. Do ____ sessions per day.  http://orth.exer.us/944   Copyright  VHI. All rights reserved.   

## 2023-05-08 NOTE — Telephone Encounter (Signed)
-----   Message from Burnard Bunting sent at 05/06/2023  8:00 AM EST ----- Hi Anjuli Gemmill please follow-up 6 weeks Franky Macho thanks

## 2023-05-08 NOTE — Telephone Encounter (Signed)
Patient needs 6wk follow up per August Saucer.

## 2023-05-08 NOTE — Therapy (Signed)
OUTPATIENT OCCUPATIONAL THERAPY ORTHO TREATMENT NOTE  Patient Name: Erin [name] MRN: [id number] DOB:[birth year], 62 y.o., female Today's Date: 05/08/2023   END OF SESSION:  OT End of Session - 05/08/23 1138     Visit Number 4    Number of Visits 17    Date for OT Re-Evaluation 06/09/23    Authorization Type [employer]    OT Start Time 1103    OT Stop Time 1143    OT Time Calculation (min) 40 min    Activity Tolerance Patient tolerated treatment well    Behavior During Therapy WFL for tasks assessed/performed              Past Medical History:  Diagnosis Date   AICD (automatic cardioverter/defibrillator) present    Allergic reaction to alpha-gal    Allergy 03/05/2022   Anemia    Anxiety state 03/25/2016   Arthritis    Basal cell carcinoma of forehead    Brain aneurysm    CHF (congestive heart failure) (HCC)    Coronary artery disease    a. 03/11/16 PCI with DES-->Prox/Mid Cx;  b. 03/14/16 PCI with DES x2-->RCA, EF 30-35%.   Depression 06/09/2010   Encounter for general adult medical examination with abnormal findings 05/04/2022   Essential hypertension    Hx   GERD (gastroesophageal reflux disease)    HFrEF (heart failure with reduced ejection fraction) (HCC)    a. 10/2016 Echo: EF 35-40%, Gr1 DD, mild focal basal septal hypertrophy, basal inflat, mid inflat, basal antlat AK. Mid infept/inf/antlat, apical lateral sev HK. Mod MR. mildly reduced RV fxn. Mild TR.   History of pneumonia    Hyperlipidemia    IBS (irritable bowel syndrome)    Ischemic cardiomyopathy    a. 10/2016 Echo: EF 35-40%, Gr1 DD.   Mitral regurgitation    Neuromuscular disorder (HCC)    NSTEMI (non-ST elevated myocardial infarction) (HCC) 03/10/2016   Pneumonia 03/2016   Shingles    Squamous cell cancer of skin of nose    Thrombocytosis 03/26/2016   Tobacco abuse    Trichimoniasis    Wears dentures    Wears glasses    Past Surgical History:  Procedure Laterality Date    APPENDECTOMY     BICEPT TENODESIS Left 03/21/2023   Procedure: BICEPS TENODESIS;  Surgeon: Cammy Copa, MD;  Location: [facility] OR;  Service: Orthopedics;  Laterality: Left;   BIOPSY  09/20/2018   Procedure: BIOPSY;  Surgeon: Corbin Ade, MD;  Location: [facility];  Service: Endoscopy;;  colon   BIOPSY  01/05/2021   Procedure: BIOPSY;  Surgeon: Rachael Fee, MD;  Location: [facility] ENDOSCOPY;  Service: Endoscopy;;   BIOPSY  04/27/2022   Procedure: BIOPSY;  Surgeon: Lanelle Bal, DO;  Location: [facility];  Service: Endoscopy;;   CARDIAC CATHETERIZATION N/A 03/11/2016   Procedure: Left Heart Cath and Coronary Angiography;  Surgeon: Marykay Lex, MD;  Location: [facility] INVASIVE CV LAB;  Service: Cardiovascular;  Laterality: N/A;   CARDIAC CATHETERIZATION N/A 03/11/2016   Procedure: Coronary Stent Intervention;  Surgeon: Marykay Lex, MD;  Location: [facility] INVASIVE CV LAB;  Service: Cardiovascular;  Laterality: N/A;   CARDIAC CATHETERIZATION N/A 03/14/2016   Procedure: Coronary Stent Intervention;  Surgeon: Peter M Swaziland, MD;  Location: [facility] INVASIVE CV LAB;  Service: Cardiovascular;  Laterality: N/A;   CEREBRAL ANEURYSM REPAIR  04/2019   stent placed   CHOLECYSTECTOMY OPEN  1984   COLONOSCOPY WITH PROPOFOL N/A 09/20/2018   Procedure:  COLONOSCOPY WITH PROPOFOL;  Surgeon: Corbin Ade, MD;  [facility]: [facility];  Service: Endoscopy;  Laterality: N/A;  10:30am   CORONARY ANGIOPLASTY WITH STENT PLACEMENT  03/14/2016   ESOPHAGOGASTRODUODENOSCOPY (EGD) WITH PROPOFOL N/A 09/20/2018   Procedure: ESOPHAGOGASTRODUODENOSCOPY (EGD) WITH PROPOFOL;  Surgeon: Corbin Ade, MD;  [facility]: [facility];  Service: Endoscopy;  Laterality: N/A;   ESOPHAGOGASTRODUODENOSCOPY (EGD) WITH PROPOFOL N/A 01/05/2021   Procedure: ESOPHAGOGASTRODUODENOSCOPY (EGD) WITH PROPOFOL;  Surgeon: Rachael Fee, MD;  Location: [facility] ENDOSCOPY;  Service: Endoscopy;  Laterality: N/A;   ESOPHAGOGASTRODUODENOSCOPY  (EGD) WITH PROPOFOL N/A 04/27/2022   Procedure: ESOPHAGOGASTRODUODENOSCOPY (EGD) WITH PROPOFOL;  Surgeon: Lanelle Bal, DO;  [facility]: [facility];  Service: Endoscopy;  Laterality: N/A;  9:15am, asa 3/4, ASAP   ESOPHAGOGASTRODUODENOSCOPY (EGD) WITH PROPOFOL N/A 01/04/2023   Procedure: ESOPHAGOGASTRODUODENOSCOPY (EGD) WITH PROPOFOL;  Surgeon: Corbin Ade, MD;  [facility]: [facility];  Service: Endoscopy;  Laterality: N/A;  130pm, asa 3   FINGER ARTHROPLASTY Left 05/14/2013   Procedure: LEFT THUMB CARPAL METACARPAL ARTHROPLASTY;  Surgeon: Tami Ribas, MD;  Location:  SURGERY CENTER;  Service: Orthopedics;  Laterality: Left;   ICD IMPLANT N/A 04/03/2020   Procedure: ICD IMPLANT;  Surgeon: Regan Lemming, MD;  Location: [facility] INVASIVE CV LAB;  Service: Cardiovascular;  Laterality: N/A;   IR ANGIO INTRA EXTRACRAN SEL COM CAROTID INNOMINATE BILAT MOD SED  01/05/2017   IR ANGIO INTRA EXTRACRAN SEL COM CAROTID INNOMINATE BILAT MOD SED  03/19/2019   IR ANGIO INTRA EXTRACRAN SEL COM CAROTID INNOMINATE BILAT MOD SED  06/04/2020   IR ANGIO INTRA EXTRACRAN SEL INTERNAL CAROTID UNI L MOD SED  04/15/2019   IR ANGIO VERTEBRAL SEL VERTEBRAL BILAT MOD SED  01/05/2017   IR ANGIO VERTEBRAL SEL VERTEBRAL BILAT MOD SED  03/19/2019   IR ANGIO VERTEBRAL SEL VERTEBRAL UNI L MOD SED  06/04/2020   IR ANGIOGRAM FOLLOW UP STUDY  04/15/2019   IR RADIOLOGIST EVAL & MGMT  12/30/2016   IR TRANSCATH/EMBOLIZ  04/15/2019   IR US GUIDE VASC ACCESS RIGHT  03/19/2019   IR US GUIDE VASC ACCESS RIGHT  06/04/2020   MALONEY DILATION N/A 09/20/2018   Procedure: Elease Hashimoto DILATION;  Surgeon: Corbin Ade, MD;  [facility]: [facility];  Service: Endoscopy;  Laterality: N/A;   MALONEY DILATION N/A 01/04/2023   Procedure: Elease Hashimoto DILATION;  Surgeon: Corbin Ade, MD;  Location: [facility];  Service: Endoscopy;  Laterality: N/A;   RADIOLOGY WITH ANESTHESIA N/A 04/15/2019   Procedure: Sharman Crate;   Surgeon: Julieanne Cotton, MD;  Location: [facility] OR;  Service: Radiology;  Laterality: N/A;   REVERSE SHOULDER ARTHROPLASTY Left 03/21/2023   Procedure: LEFT REVERSE SHOULDER ARTHROPLASTY;  Surgeon: Cammy Copa, MD;  Location: [facility] OR;  Service: Orthopedics;  Laterality: Left;   RIGHT/LEFT HEART CATH AND CORONARY ANGIOGRAPHY N/A 08/19/2019   Procedure: RIGHT/LEFT HEART CATH AND CORONARY ANGIOGRAPHY;  Surgeon: Laurey Morale, MD;  Location: [facility] INVASIVE CV LAB;  Service: Cardiovascular;  Laterality: N/A;   TUBAL LIGATION  1987   VAGINAL HYSTERECTOMY  2009   Patient Active Problem List   Diagnosis Date Noted   Panic attacks 04/21/2023   Multiple pulmonary nodules determined by computed tomography of lung 04/21/2023   Arthritis of left shoulder region 04/02/2023   Biceps tendonitis on left 04/02/2023   OA (osteoarthritis) of shoulder 03/21/2023   S/P reverse total shoulder arthroplasty, left 03/21/2023   RUQ pain 06/29/2022   Pancreatic cyst  06/29/2022   Peripheral neuropathy 05/04/2022   Restless leg syndrome 05/04/2022   Hypertriglyceridemia 05/04/2022   Bilateral shoulder pain 05/04/2022   Encounter for general adult medical examination with abnormal findings 05/04/2022   H/O total hysterectomy 04/26/2022   Odynophagia 04/20/2022   Abdominal pain, epigastric 04/20/2022   Nausea without vomiting 06/16/2021   Allergy to alpha-gal 06/16/2021   Orthostatic hypotension 06/16/2021   Hypocortisolemia (HCC) 03/02/2021   Adrenal adenoma, left 03/02/2021   AKI (acute kidney injury) (HCC) 01/04/2021   Intractable nausea and vomiting 01/03/2021   Left-sided weakness 10/19/2020   Cough 02/11/2020   Near syncope 12/10/2019   Brain aneurysm 04/15/2019   Dysphagia 02/27/2018   Encounter for screening colonoscopy 02/27/2018   History of Clostridium difficile infection 02/27/2018   Chronic diarrhea 12/20/2017   Chronic combined systolic and diastolic congestive heart failure (HCC)  06/20/2016   Hypokalemia due to excessive gastrointestinal loss of potassium    Acute CHF (congestive heart failure) (HCC) 05/16/2016   Acute on chronic systolic CHF (congestive heart failure) (HCC) 05/16/2016   Acute respiratory failure with hypoxia (HCC)    Thrombocytosis 03/26/2016   Cardiomyopathy, ischemic 03/25/2016   Chronic combined systolic and diastolic heart failure (HCC) 03/25/2016   Anxiety state 03/25/2016   Troponin level elevated 03/25/2016   Coronary artery disease involving native coronary artery of native heart 03/25/2016   Normocytic anemia 03/25/2016   SOB (shortness of breath) 03/24/2016   Lightheadedness 03/17/2016   Hypotension 03/17/2016   Tobacco abuse 03/12/2016   NSTEMI (non-ST elevated myocardial infarction) (HCC) 03/11/2016   Atypical chest pain    Essential hypertension 09/06/2015   Hyperlipidemia 09/06/2015   GERD without esophagitis 09/06/2015   Chest pain 09/06/2015    PCP: Christel Mormon, MD REFERRING PROVIDER: Lenice Pressman, MD  ONSET DATE: 03/21/23  REFERRING DIAG: L Reverse Total Shoulder  THERAPY DIAG:  Acute pain of left shoulder  Stiffness of left shoulder, not elsewhere classified  Rationale for Evaluation and Treatment: Rehabilitation  SUBJECTIVE:   SUBJECTIVE STATEMENT: "I am back to driving" Pt accompanied by: self  PERTINENT HISTORY: Pt reports L shoulder pain for ~7 years with worsening pain. This past year she reports limiting ability to cook, clean, and complete certain parts of dressing and bathing. On 03/21/23, pt s/p L Reverse Total Shoulder.   PRECAUTIONS: Shoulder  WEIGHT BEARING RESTRICTIONS: Yes >1lb  PAIN:  Are you having pain? Yes: NPRS scale: 4/10 Pain location: Deltoid Pain description: dull ache Aggravating factors: Cold, movement Relieving factors: medication  FALLS: Has patient fallen in last 6 months? No  PLOF: Independent  PATIENT GOALS: "To be able to fasten my own bra"  NEXT MD VISIT:  05/05/23  OBJECTIVE:   HAND DOMINANCE: Right  ADLs: Overall ADLs: Pt reports inability to clasp her bra, put her hair up, or wash her hair. Due to precautions and pain/ROM limits she is unable to lift and carry any items heavier than her cell phone.   FUNCTIONAL OUTCOME MEASURES: FOTO: 42.21  UPPER EXTREMITY ROM:       Assessed in supine, er/IR adducted  Passive ROM Left eval  Shoulder flexion 99  Shoulder abduction 78  Shoulder internal rotation 90  Shoulder external rotation 24  (Blank rows = not tested)    UPPER EXTREMITY MMT:     Assessed in seated, er/IR adducted  MMT Left eval  Shoulder flexion   Shoulder abduction   Shoulder internal rotation   Shoulder external rotation   Middle trapezius  Lower trapezius   (Blank rows = not tested)  SENSATION: Mild numbness and tingling noted intermittently in finger tips and elbow  EDEMA: no swelling noted  OBSERVATIONS: Moderate fascial restrictions    TODAY'S TREATMENT:                                                                                                                              DATE:   05/09/23 -manual therapy: myofascial release and trigger point applied to biceps, trapezius, deltoid, and scapular region for improving pain and fascial restrictions, as well as ROM -AA/ROM: supine, flexion, abduction, protraction, horizontal abduction, er/IR x12 -A/ROM: supine, flexion, abduction, protraction, horizontal abduction, er/IR, x8 - Wall wash: 60" flexion and abduction   - Functional reach: reaching into cabinet (second and third shelf) 5 items each  - UBE: 3 mins forwards 3 mins backwards level 2.5     04/24/23 -manual therapy: myofascial release and trigger point applied to biceps, trapezius, deltoid, and scapular region for improving pain and fascial restrictions, as well as ROM -AA/ROM: supine, flexion, abduction, protraction, horizontal abduction, er/IR x12 -A/ROM: supine, flexion, abduction,  protraction, horizontal abduction, er/IR, x8 -Thumb tacs, x60" -Wall Climbs, flexion, x10 -Pulleys: flexion, abduction, x60"  04/17/23 -manual therapy: myofascial release and trigger point applied to biceps, trapezius, deltoid, and scapular region for improving pain and fascial restrictions, as well as ROM -P/ROM: supine, flexion, abduction, er/IR, x10 -AA/ROM: supine, flexion, abduction, protraction, x8 -Low Level Wall Wash, x60" -Thumb tacs, x60" -Pendulums, 2x30"   PATIENT EDUCATION: Education details: AA/ROM Person educated: Patient Education method: Programmer, multimedia, Demonstration, and Handouts Education comprehension: verbalized understanding and returned demonstration  HOME EXERCISE PROGRAM: 12/2: Table Slides 12/9: Pendulums 12/16: AA/ROM 12/30: A/ROM   GOALS: Goals reviewed with patient? Yes   SHORT TERM GOALS: Target date: 05/08/23  Pt will be provided with and educated on HEP to improve mobility in LUE required for use during ADL completion.   Goal status: IN PROGRESS  2.  Pt will increase LUE P/ROM by 30 degrees to improve ability to use LUE during dressing tasks with minimal compensatory techniques.   Goal status: IN PROGRESS  3.  Pt will increase LUE strength to 3+/5 to improve ability to reach for items at waist to chest height during bathing and grooming tasks.   Goal status: IN PROGRESS   LONG TERM GOALS: Target date: 06/09/23  Pt will decrease pain in LUE to 3/10 or less to improve ability to sleep for 2+ consecutive hours without waking due to pain.   Goal status: IN PROGRESS  2.  Pt will decrease LUE fascial restrictions to min amounts or less to improve mobility required for functional reaching tasks.   Goal status: IN PROGRESS  3.  Pt will increase LUE A/ROM by 45 degrees to improve ability to use LUE when reaching overhead or behind back during dressing and bathing tasks.   Goal status: IN PROGRESS  4.  Pt will increase  LUE strength to 4+/5  or greater to improve ability to use LUE when lifting or carrying items during meal preparation/housework/yardwork tasks.   Goal status: IN PROGRESS  5.  Pt will return to highest level of function using LUE as non-dominant during functional task completion.   Goal status: IN PROGRESS   ASSESSMENT:  CLINICAL IMPRESSION: Pt had recent ortho MD visit that she reports went well- she will have another check up in 6 weeks. She stated that she is having some pain in her elbow- MD said that can be typical. Continued with A/ROM in supine this visit, abduction is slightly more difficult that flexion. Added in higher level wall wash in flexion and abduction.   PERFORMANCE DEFICITS: in functional skills including in functional skills including ADLs, IADLs, coordination, tone, ROM, strength, pain, fascial restrictions, muscle spasms, and UE functional use.   PLAN:  OT FREQUENCY: 2x/week  OT DURATION: 8 weeks  PLANNED INTERVENTIONS: 97168 OT Re-evaluation, 97535 self care/ADL training, 16109 therapeutic exercise, 97530 therapeutic activity, 97140 manual therapy, 97035 ultrasound, 97010 moist heat, 97032 electrical stimulation (manual), passive range of motion, energy conservation, coping strategies training, patient/family education, and DME and/or AE instructions  RECOMMENDED OTHER SERVICES: N/A  CONSULTED AND AGREED WITH PLAN OF CARE: Patient  PLAN FOR NEXT SESSION: Manual Therapy, P/ROM, Table Slides, Thumb Sharia Reeve   [facility] [phone] Bevelyn Ngo, OT 05/08/2023, 11:39 AM

## 2023-05-11 ENCOUNTER — Telehealth (HOSPITAL_COMMUNITY): Payer: Self-pay

## 2023-05-11 ENCOUNTER — Ambulatory Visit (HOSPITAL_COMMUNITY): Payer: Medicare PPO | Admitting: Occupational Therapy

## 2023-05-11 NOTE — Telephone Encounter (Signed)
 05/15/23 appt confirmed by pt

## 2023-05-15 ENCOUNTER — Ambulatory Visit (HOSPITAL_COMMUNITY): Payer: Medicare PPO | Admitting: Registered Nurse

## 2023-05-15 ENCOUNTER — Encounter (HOSPITAL_COMMUNITY): Payer: Medicare PPO | Admitting: Occupational Therapy

## 2023-05-17 NOTE — Progress Notes (Signed)
 No ICM remote transmission received for 05/15/2023 and next ICM transmission scheduled for 05/30/2023.

## 2023-05-18 ENCOUNTER — Encounter (HOSPITAL_COMMUNITY): Payer: Self-pay | Admitting: Occupational Therapy

## 2023-05-18 ENCOUNTER — Ambulatory Visit (HOSPITAL_COMMUNITY): Payer: Medicare PPO | Attending: Orthopedic Surgery | Admitting: Occupational Therapy

## 2023-05-18 DIAGNOSIS — I5022 Chronic systolic (congestive) heart failure: Secondary | ICD-10-CM | POA: Insufficient documentation

## 2023-05-18 DIAGNOSIS — R29898 Other symptoms and signs involving the musculoskeletal system: Secondary | ICD-10-CM | POA: Diagnosis not present

## 2023-05-18 DIAGNOSIS — M25512 Pain in left shoulder: Secondary | ICD-10-CM | POA: Insufficient documentation

## 2023-05-18 DIAGNOSIS — Z95810 Presence of automatic (implantable) cardiac defibrillator: Secondary | ICD-10-CM | POA: Diagnosis not present

## 2023-05-18 DIAGNOSIS — M25612 Stiffness of left shoulder, not elsewhere classified: Secondary | ICD-10-CM | POA: Diagnosis not present

## 2023-05-18 NOTE — Therapy (Signed)
 OUTPATIENT OCCUPATIONAL THERAPY ORTHO TREATMENT NOTE  Patient Name: Erin [name] MRN: [id number] DOB:[birth year], 63 y.o., female Today's Date: 05/18/2023   END OF SESSION:  OT End of Session - 05/18/23 1146     Visit Number 5    Number of Visits 17    Date for OT Re-Evaluation 06/09/23    Authorization Type [employer]    Progress Note Due on Visit 10    OT Start Time 1101    OT Stop Time 1140    OT Time Calculation (min) 39 min    Activity Tolerance Patient tolerated treatment well    Behavior During Therapy WFL for tasks assessed/performed               Past Medical History:  Diagnosis Date   AICD (automatic cardioverter/defibrillator) present    Allergic reaction to alpha-gal    Allergy 03/05/2022   Anemia    Anxiety state 03/25/2016   Arthritis    Basal cell carcinoma of forehead    Brain aneurysm    CHF (congestive heart failure) (HCC)    Coronary artery disease    a. 03/11/16 PCI with DES-->Prox/Mid Cx;  b. 03/14/16 PCI with DES x2-->RCA, EF 30-35%.   Depression 06/09/2010   Encounter for general adult medical examination with abnormal findings 05/04/2022   Essential hypertension    Hx   GERD (gastroesophageal reflux disease)    HFrEF (heart failure with reduced ejection fraction) (HCC)    a. 10/2016 Echo: EF 35-40%, Gr1 DD, mild focal basal septal hypertrophy, basal inflat, mid inflat, basal antlat AK. Mid infept/inf/antlat, apical lateral sev HK. Mod MR. mildly reduced RV fxn. Mild TR.   History of pneumonia    Hyperlipidemia    IBS (irritable bowel syndrome)    Ischemic cardiomyopathy    a. 10/2016 Echo: EF 35-40%, Gr1 DD.   Mitral regurgitation    Neuromuscular disorder (HCC)    NSTEMI (non-ST elevated myocardial infarction) (HCC) 03/10/2016   Pneumonia 03/2016   Shingles    Squamous cell cancer of skin of nose    Thrombocytosis 03/26/2016   Tobacco abuse    Trichimoniasis    Wears dentures    Wears glasses    Past Surgical History:   Procedure Laterality Date   APPENDECTOMY     BICEPT TENODESIS Left 03/21/2023   Procedure: BICEPS TENODESIS;  Surgeon: Addie Cordella Hamilton, MD;  Location: [facility] OR;  Service: Orthopedics;  Laterality: Left;   BIOPSY  09/20/2018   Procedure: BIOPSY;  Surgeon: Shaaron Lamar HERO, MD;  Location: [facility];  Service: Endoscopy;;  colon   BIOPSY  01/05/2021   Procedure: BIOPSY;  Surgeon: Teressa Toribio SQUIBB, MD;  Location: [facility] ENDOSCOPY;  Service: Endoscopy;;   BIOPSY  04/27/2022   Procedure: BIOPSY;  Surgeon: Cindie Carlin POUR, DO;  Location: [facility];  Service: Endoscopy;;   CARDIAC CATHETERIZATION N/A 03/11/2016   Procedure: Left Heart Cath and Coronary Angiography;  Surgeon: Alm LELON Clay, MD;  Location: [facility] INVASIVE CV LAB;  Service: Cardiovascular;  Laterality: N/A;   CARDIAC CATHETERIZATION N/A 03/11/2016   Procedure: Coronary Stent Intervention;  Surgeon: Alm LELON Clay, MD;  Location: [facility] INVASIVE CV LAB;  Service: Cardiovascular;  Laterality: N/A;   CARDIAC CATHETERIZATION N/A 03/14/2016   Procedure: Coronary Stent Intervention;  Surgeon: Peter M Jordan, MD;  Location: [facility] INVASIVE CV LAB;  Service: Cardiovascular;  Laterality: N/A;   CEREBRAL ANEURYSM REPAIR  04/2019   stent placed   CHOLECYSTECTOMY OPEN  1984  COLONOSCOPY WITH PROPOFOL  N/A 09/20/2018   Procedure: COLONOSCOPY WITH PROPOFOL ;  Surgeon: Shaaron Lamar HERO, MD;  [facility]: [facility];  Service: Endoscopy;  Laterality: N/A;  10:30am   CORONARY ANGIOPLASTY WITH STENT PLACEMENT  03/14/2016   ESOPHAGOGASTRODUODENOSCOPY (EGD) WITH PROPOFOL  N/A 09/20/2018   Procedure: ESOPHAGOGASTRODUODENOSCOPY (EGD) WITH PROPOFOL ;  Surgeon: Shaaron Lamar HERO, MD;  [facility]: [facility];  Service: Endoscopy;  Laterality: N/A;   ESOPHAGOGASTRODUODENOSCOPY (EGD) WITH PROPOFOL  N/A 01/05/2021   Procedure: ESOPHAGOGASTRODUODENOSCOPY (EGD) WITH PROPOFOL ;  Surgeon: Teressa Toribio SQUIBB, MD;  Location: [facility] ENDOSCOPY;  Service: Endoscopy;  Laterality: N/A;    ESOPHAGOGASTRODUODENOSCOPY (EGD) WITH PROPOFOL  N/A 04/27/2022   Procedure: ESOPHAGOGASTRODUODENOSCOPY (EGD) WITH PROPOFOL ;  Surgeon: Cindie Carlin POUR, DO;  Location: [facility];  Service: Endoscopy;  Laterality: N/A;  9:15am, asa 3/4, ASAP   ESOPHAGOGASTRODUODENOSCOPY (EGD) WITH PROPOFOL  N/A 01/04/2023   Procedure: ESOPHAGOGASTRODUODENOSCOPY (EGD) WITH PROPOFOL ;  Surgeon: Shaaron Lamar HERO, MD;  [facility]: [facility];  Service: Endoscopy;  Laterality: N/A;  130pm, asa 3   FINGER ARTHROPLASTY Left 05/14/2013   Procedure: LEFT THUMB CARPAL METACARPAL ARTHROPLASTY;  Surgeon: Franky JONELLE Curia, MD;  Location: [facility];  Service: Orthopedics;  Laterality: Left;   ICD IMPLANT N/A 04/03/2020   Procedure: ICD IMPLANT;  Surgeon: Inocencio Soyla Lunger, MD;  Location: [facility] INVASIVE CV LAB;  Service: Cardiovascular;  Laterality: N/A;   IR ANGIO INTRA EXTRACRAN SEL COM CAROTID INNOMINATE BILAT MOD SED  01/05/2017   IR ANGIO INTRA EXTRACRAN SEL COM CAROTID INNOMINATE BILAT MOD SED  03/19/2019   IR ANGIO INTRA EXTRACRAN SEL COM CAROTID INNOMINATE BILAT MOD SED  06/04/2020   IR ANGIO INTRA EXTRACRAN SEL INTERNAL CAROTID UNI L MOD SED  04/15/2019   IR ANGIO VERTEBRAL SEL VERTEBRAL BILAT MOD SED  01/05/2017   IR ANGIO VERTEBRAL SEL VERTEBRAL BILAT MOD SED  03/19/2019   IR ANGIO VERTEBRAL SEL VERTEBRAL UNI L MOD SED  06/04/2020   IR ANGIOGRAM FOLLOW UP STUDY  04/15/2019   IR RADIOLOGIST EVAL & MGMT  12/30/2016   IR TRANSCATH/EMBOLIZ  04/15/2019   IR US  GUIDE VASC ACCESS RIGHT  03/19/2019   IR US  GUIDE VASC ACCESS RIGHT  06/04/2020   MALONEY DILATION N/A 09/20/2018   Procedure: AGAPITO DILATION;  Surgeon: Shaaron Lamar HERO, MD;  Location: [facility];  Service: Endoscopy;  Laterality: N/A;   MALONEY DILATION N/A 01/04/2023   Procedure: AGAPITO DILATION;  Surgeon: Shaaron Lamar HERO, MD;  Location: [facility];  Service: Endoscopy;  Laterality: N/A;   RADIOLOGY WITH ANESTHESIA N/A 04/15/2019    Procedure: BARBARANN;  Surgeon: Dolphus Carrion, MD;  Location: [facility] OR;  Service: Radiology;  Laterality: N/A;   REVERSE SHOULDER ARTHROPLASTY Left 03/21/2023   Procedure: LEFT REVERSE SHOULDER ARTHROPLASTY;  Surgeon: Addie Cordella Hamilton, MD;  Location: [facility] OR;  Service: Orthopedics;  Laterality: Left;   RIGHT/LEFT HEART CATH AND CORONARY ANGIOGRAPHY N/A 08/19/2019   Procedure: RIGHT/LEFT HEART CATH AND CORONARY ANGIOGRAPHY;  Surgeon: Rolan Ezra RAMAN, MD;  Location: [facility] INVASIVE CV LAB;  Service: Cardiovascular;  Laterality: N/A;   TUBAL LIGATION  1987   VAGINAL HYSTERECTOMY  2009   Patient Active Problem List   Diagnosis Date Noted   Panic attacks 04/21/2023   Multiple pulmonary nodules determined by computed tomography of lung 04/21/2023   Arthritis of left shoulder region 04/02/2023   Biceps tendonitis on left 04/02/2023   OA (osteoarthritis) of shoulder 03/21/2023   S/P reverse total shoulder arthroplasty, left 03/21/2023  RUQ pain 06/29/2022   Pancreatic cyst 06/29/2022   Peripheral neuropathy 05/04/2022   Restless leg syndrome 05/04/2022   Hypertriglyceridemia 05/04/2022   Bilateral shoulder pain 05/04/2022   Encounter for general adult medical examination with abnormal findings 05/04/2022   H/O total hysterectomy 04/26/2022   Odynophagia 04/20/2022   Abdominal pain, epigastric 04/20/2022   Nausea without vomiting 06/16/2021   Allergy to alpha-gal 06/16/2021   Orthostatic hypotension 06/16/2021   Hypocortisolemia (HCC) 03/02/2021   Adrenal adenoma, left 03/02/2021   AKI (acute kidney injury) (HCC) 01/04/2021   Intractable nausea and vomiting 01/03/2021   Left-sided weakness 10/19/2020   Cough 02/11/2020   Near syncope 12/10/2019   Brain aneurysm 04/15/2019   Dysphagia 02/27/2018   Encounter for screening colonoscopy 02/27/2018   History of Clostridium difficile infection 02/27/2018   Chronic diarrhea 12/20/2017   Chronic combined systolic and diastolic congestive  heart failure (HCC) 06/20/2016   Hypokalemia due to excessive gastrointestinal loss of potassium    Acute CHF (congestive heart failure) (HCC) 05/16/2016   Acute on chronic systolic CHF (congestive heart failure) (HCC) 05/16/2016   Acute respiratory failure with hypoxia (HCC)    Thrombocytosis 03/26/2016   Cardiomyopathy, ischemic 03/25/2016   Chronic combined systolic and diastolic heart failure (HCC) 03/25/2016   Anxiety state 03/25/2016   Troponin level elevated 03/25/2016   Coronary artery disease involving native coronary artery of native heart 03/25/2016   Normocytic anemia 03/25/2016   SOB (shortness of breath) 03/24/2016   Lightheadedness 03/17/2016   Hypotension 03/17/2016   Tobacco abuse 03/12/2016   NSTEMI (non-ST elevated myocardial infarction) (HCC) 03/11/2016   Atypical chest pain    Essential hypertension 09/06/2015   Hyperlipidemia 09/06/2015   GERD without esophagitis 09/06/2015   Chest pain 09/06/2015    PCP: Melvenia Pastor, MD REFERRING PROVIDER: Addie Cordella RAMAN, MD  ONSET DATE: 03/21/23  REFERRING DIAG: L Reverse Total Shoulder  THERAPY DIAG:  Acute pain of left shoulder  Stiffness of left shoulder, not elsewhere classified  Other symptoms and signs involving the musculoskeletal system  Rationale for Evaluation and Treatment: Rehabilitation  SUBJECTIVE:   SUBJECTIVE STATEMENT: I am back to driving Pt accompanied by: self  PERTINENT HISTORY: Pt reports L shoulder pain for ~7 years with worsening pain. This past year she reports limiting ability to cook, clean, and complete certain parts of dressing and bathing. On 03/21/23, pt s/p L Reverse Total Shoulder.   PRECAUTIONS: Shoulder  WEIGHT BEARING RESTRICTIONS: Yes >1lb  PAIN:  Are you having pain? Yes: NPRS scale: 4/10 Pain location: Deltoid Pain description: dull ache Aggravating factors: Cold, supine position Relieving factors: medication  FALLS: Has patient fallen in last 6 months?  No  PLOF: Independent  PATIENT GOALS: To be able to fasten my own bra  NEXT MD VISIT: 06/19/23  OBJECTIVE:   HAND DOMINANCE: Right  ADLs: Overall ADLs: Pt reports inability to clasp her bra, put her hair up, or wash her hair. Due to precautions and pain/ROM limits she is unable to lift and carry any items heavier than her cell phone.   FUNCTIONAL OUTCOME MEASURES: FOTO: 42.21  UPPER EXTREMITY ROM:       Assessed in supine, er/IR adducted  Passive ROM Left eval  Shoulder flexion 99  Shoulder abduction 78  Shoulder internal rotation 90  Shoulder external rotation 24  (Blank rows = not tested)    UPPER EXTREMITY MMT:     Assessed in seated, er/IR adducted  MMT Left eval  Shoulder flexion  Shoulder abduction   Shoulder internal rotation   Shoulder external rotation   Middle trapezius   Lower trapezius   (Blank rows = not tested)  SENSATION: Mild numbness and tingling noted intermittently in finger tips and elbow  EDEMA: no swelling noted  OBSERVATIONS: Moderate fascial restrictions    TODAY'S TREATMENT:                                                                                                                              DATE:  05/18/23 -manual therapy: myofascial release and trigger point applied to biceps, trapezius, deltoid, and scapular region for improving pain and fascial restrictions, as well as ROM -P/ROM: supine-flexion, abduction, horizontal abduction, er, 5 reps -A/ROM: supine-protraction, flexion, abduction, er, horizontal abduction, 12 reps -Proximal shoulder strengthening: supine-paddles, criss cross, circles each direction, 10 reps -A/ROM: standing-protraction, flexion, abduction, er, horizontal abduction, 10 reps -Wall wash: 1' -Proximal shoulder strengthening: standing at doorway, shoulder at 90 degrees flexion, 1'  -UBE: level 1, 2' forward, 2' reverse, pace: 4.5 -Scapular theraband: red-row, extension, retraction, 10  reps  05/09/23 -manual therapy: myofascial release and trigger point applied to biceps, trapezius, deltoid, and scapular region for improving pain and fascial restrictions, as well as ROM -AA/ROM: supine, flexion, abduction, protraction, horizontal abduction, er/IR x12 -A/ROM: supine, flexion, abduction, protraction, horizontal abduction, er/IR, x8 - Wall wash: 60 flexion and abduction   - Functional reach: reaching into cabinet (second and third shelf) 5 items each  - UBE: 3 mins forwards 3 mins backwards level 2.5  04/24/23 -manual therapy: myofascial release and trigger point applied to biceps, trapezius, deltoid, and scapular region for improving pain and fascial restrictions, as well as ROM -AA/ROM: supine, flexion, abduction, protraction, horizontal abduction, er/IR x12 -A/ROM: supine, flexion, abduction, protraction, horizontal abduction, er/IR, x8 -Thumb tacs, x60 -Wall Climbs, flexion, x10 -Pulleys: flexion, abduction, x60   PATIENT EDUCATION: Education details: reviewed HEP Person educated: Patient Education method: Programmer, Multimedia, Demonstration, and Handouts Education comprehension: verbalized understanding and returned demonstration  HOME EXERCISE PROGRAM: 12/2: Table Slides 12/9: Pendulums 12/16: AA/ROM 12/30: A/ROM   GOALS: Goals reviewed with patient? Yes   SHORT TERM GOALS: Target date: 05/08/23  Pt will be provided with and educated on HEP to improve mobility in LUE required for use during ADL completion.   Goal status: IN PROGRESS  2.  Pt will increase LUE P/ROM by 30 degrees to improve ability to use LUE during dressing tasks with minimal compensatory techniques.   Goal status: IN PROGRESS  3.  Pt will increase LUE strength to 3+/5 to improve ability to reach for items at waist to chest height during bathing and grooming tasks.   Goal status: IN PROGRESS   LONG TERM GOALS: Target date: 06/09/23  Pt will decrease pain in LUE to 3/10 or less to  improve ability to sleep for 2+ consecutive hours without waking due to pain.   Goal status: IN PROGRESS  2.  Pt will  decrease LUE fascial restrictions to min amounts or less to improve mobility required for functional reaching tasks.   Goal status: IN PROGRESS  3.  Pt will increase LUE A/ROM by 45 degrees to improve ability to use LUE when reaching overhead or behind back during dressing and bathing tasks.   Goal status: IN PROGRESS  4.  Pt will increase LUE strength to 4+/5 or greater to improve ability to use LUE when lifting or carrying items during meal preparation/housework/yardwork tasks.   Goal status: IN PROGRESS  5.  Pt will return to highest level of function using LUE as non-dominant during functional task completion.   Goal status: IN PROGRESS   ASSESSMENT:  CLINICAL IMPRESSION: Pt reports she feels some tightness and discomfort in the left upper quadrant around her defibrillator, like muscle tightness. Pt with tightness at upper arm and anterior deltoid, trapezius regions. Manual therapy completed to address, resumed P/ROM and progressed to all A/ROM today. Pt with active abduction limited to approximately 60 degrees with A/ROM exercises, however improved with functional task reaching for a target.  Added scapular theraband-mod difficulty with extension. Verbal cuing for form and technique.   PERFORMANCE DEFICITS: in functional skills including in functional skills including ADLs, IADLs, coordination, tone, ROM, strength, pain, fascial restrictions, muscle spasms, and UE functional use.   PLAN:  OT FREQUENCY: 2x/week  OT DURATION: 8 weeks  PLANNED INTERVENTIONS: 97168 OT Re-evaluation, 97535 self care/ADL training, 02889 therapeutic exercise, 97530 therapeutic activity, 97140 manual therapy, 97035 ultrasound, 97010 moist heat, 97032 electrical stimulation (manual), passive range of motion, energy conservation, coping strategies training, patient/family education, and  DME and/or AE instructions  CONSULTED AND AGREED WITH PLAN OF CARE: Patient  PLAN FOR NEXT SESSION: Manual Therapy, P/ROM, A/ROM, proximal shoulder strengthening, functional reaching, add shoulder stretches   Sonny [name], OTR/L  [phone] 05/18/2023, 11:48 AM

## 2023-05-22 ENCOUNTER — Encounter (HOSPITAL_COMMUNITY): Payer: Medicare PPO | Admitting: Occupational Therapy

## 2023-05-22 ENCOUNTER — Ambulatory Visit (INDEPENDENT_AMBULATORY_CARE_PROVIDER_SITE_OTHER): Payer: Medicare PPO

## 2023-05-22 DIAGNOSIS — I5022 Chronic systolic (congestive) heart failure: Secondary | ICD-10-CM

## 2023-05-22 DIAGNOSIS — Z95810 Presence of automatic (implantable) cardiac defibrillator: Secondary | ICD-10-CM | POA: Diagnosis not present

## 2023-05-22 NOTE — Progress Notes (Addendum)
PCP: Billie Lade, MD HF Cardiology: Dr. Shirlee Latch  CC: HF follow up  63 y.o. with history of CAD, ischemic cardiomyopathy, and PAD was referred by Dr. Darl Householder for evaluation of CHF.  She had NSTEMI in 11/17 with PCI to prox/mid LCx and RCA.  Subsequently, has developed ischemic cardiomyopathy.  Most recent echo in 1/21 showed EF 30-35%.  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   RHC/LHC done with exertional chest heaviness and dyspnea in 4/21 showed normal filling pressures, preserved cardiac output, and nonobstructive mild CAD.  ABIs were normal in 4/21.   In 8/21, she had syncope thought to be related to orthostasis from low BP in the setting of cardiac medication titration.  Entresto was decreased.   She was in the ER in 11/21 with chest pain.  Troponin and COVID-19 negative. Echo in 11/21 showed EF 30-35%, normal RV.   She was hospitalized in 6/22 with left-sided weakness/numbness.  No evidence for acute CVA, ?TIA.    Echo in 11/22 showed EF 30-35% with mild LV dilation, normal RV, mild-moderate MR, IVC normal.   Echo 2/24 showed EF remains 30-35% with normal RV.   Patient had left shoulder replacement in 11/24.   Today she returns for HF follow up with her granddaughter. Overall feeling fair. Having GI upset and SOB past couple of weeks. Breathing has improved over the past 2 days. Feeling palpitations. Had dizziness 2 nights ago, legs gave out and she fell. BP after fall was 87/50. Did not hit her head.  Denies CP, edema, or PND/Orthopnea. Appetite ok. No fever or chills. Taking all medications. Smoking "off and on", had 1 cigarette today but a pack will last 2 weeks. Wearing nicotine patches, stopped Chantix due to nightmares. Works as Conservation officer, nature at [employer]. BP at home 92/58 yesterday.  Medtronic device interrogation (personally reviewed): OptiVol down, thoracic impedence at baseline, 3 episodes of NSVT,  no AF, 100% VP, 1.6 hr/day  activity.   ECG (personally reviewed): none ordered today  Labs (2/21): K 3.7, creatinine 0.82 Labs (4/21): K 4.3, creatinine 0.81, LDL 67, TGs 286 Labs (6/21): K 4.7, creatinine 1.22 Labs (11/21): K 3.7, creatinine 0.84, hgb 12.6, LDL 67, HDL 48, TGs 286 Labs (1/22). K 4.4, creatinine 1.03 Labs (6/22): K 3.5, creatinine 1.0 Labs (11/23): K 3.1, creatinine 0.97 Labs (12/23): K 3.8, creatinine 0.97 Labs (3/24): K 3.9, creatinine 1.09, LDL 78 Labs (5/24): K 3.2, creatinine 1.11, BNP 111 Labs (6/24): LDL 20, TGs 198 Labs (11/24): K 4.1, creatinine 1.66 Labs (12/24): K 4.3, creatinine 1.72  PMH: 1. CAD: NSTEMI with DES to proximal and mid LCx in 11/17, staged DES x 2 to RCA later in 11/17.  - Cardiolite (10/20): EF < 30%, large inferior and inferolateral MI with mild peri-infarct ischemia.  - LHC (4/21): Patent stents, nonobstructive CAD.  2. Chronic systolic CHF: Ischemic cardiomyopathy. Medtronic ICD.  - Echo (10/20): EF 35-40%, lateral WMAs, normal RV. - Echo (1/21): EF 30-35%, mild LVH, normal RV - RHC (4/21): mean RA 5, PA 29/3, mean PCWP 12, CI 3.04 - Echo (11/21): EF 30-35%, normal RV.  - Echo (11/22): EF 30-35% with mild LV dilation, normal RV, mild-moderate MR, IVC normal.  - Echo (2/24): EF 30-35%, normal RV.  3. 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  4. Carotid stenosis: Carotid dopplers (10/20) with 60-79% LICA stenosis.  - Carotid dopplers (6/21): 40-59% BICA stenosis.  - Carotid dopplers (1/22): 40-59% RICA,  60-79% LICA.  - Carotid dopplers (4/23): 40-59% LICA - Carotid dopplers (12/23): 40-59% LICA - Carotid dopplers (12/24): 40-59% LICA stenosis 5. Prior smoker.  6. PAD: ABIs (4/21) Normal.  - Peripheral artery dopplers (12/21): bilateral plaque without focal stenosis.  7. Left shoulder replacement 11/24  Social History   Socioeconomic History   Marital status: Married    Spouse name: Not on file   Number of children: Not on  file   Years of education: Not on file   Highest education level: Associate degree: occupational, Scientist, product/process development, or vocational program  Occupational History   Occupation: CNA  Tobacco Use   Smoking status: Some Days    Current packs/day: 0.25    Average packs/day: 0.3 packs/day for 15.0 years (3.8 ttl pk-yrs)    Types: Cigarettes   Smokeless tobacco: Never   Tobacco comments:    smokes a cigarette occasionally  Vaping Use   Vaping status: Never Used  Substance and Sexual Activity   Alcohol use: Not Currently    Comment: occasionally   Drug use: Yes    Types: Marijuana   Sexual activity: Not Currently    Birth control/protection: Surgical    Comment: hyst  Other Topics Concern   Not on file  Social History Narrative   Lives with husband in [place] in a one story home with a basement.  Has 4 children.  Works as a Lawyer.  Education: CNA school.    Social Drivers of Corporate investment banker Strain: Low Risk  (04/20/2023)   Overall Financial Resource Strain (CARDIA)    Difficulty of Paying Living Expenses: Not hard at all  Food Insecurity: No Food Insecurity (04/20/2023)   Hunger Vital Sign    Worried About Running Out of Food in the Last Year: Never true    Ran Out of Food in the Last Year: Never true  Transportation Needs: No Transportation Needs (04/20/2023)   PRAPARE - Administrator, Civil Service (Medical): No    Lack of Transportation (Non-Medical): No  Physical Activity: Insufficiently Active (04/20/2023)   Exercise Vital Sign    Days of Exercise per Week: 3 days    Minutes of Exercise per Session: 30 min  Stress: Stress Concern Present (04/20/2023)   [employer] of Occupational Health - Occupational Stress Questionnaire    Feeling of Stress : Very much  Social Connections: Socially Isolated (04/20/2023)   Social Connection and Isolation Panel [NHANES]    Frequency of Communication with Friends and Family: Once a week    Frequency of Social Gatherings  with Friends and Family: Once a week    Attends Religious Services: Never    Database administrator or Organizations: No    Attends Engineer, structural: Not on file    Marital Status: Married  Catering manager Violence: Not At Risk (03/21/2023)   Humiliation, Afraid, Rape, and Kick questionnaire    Fear of Current or Ex-Partner: No    Emotionally Abused: No    Physically Abused: No    Sexually Abused: No   Family History  Problem Relation Age of Onset   Stroke Mother    Hypertension Mother    Diabetes Mother    Heart attack Mother    Heart attack Father    Diabetes Father    Hypertension Father    CAD Father    Colon polyps Father 76       pre-cancerous    Stroke Father    Dementia Father  Hyperlipidemia Father    Arthritis Father    COPD Father    Heart disease Father    Breast cancer Maternal Grandmother    Diabetes Maternal Grandmother    Cancer Maternal Grandfather        Tongue and esophageal   Anxiety disorder Daughter    Depression Daughter    Anxiety disorder Daughter    Heart failure Other    Cancer Paternal Grandmother    Colon cancer Neg Hx    ROS: All systems reviewed and negative except as per HPI.   Current Outpatient Medications  Medication Sig Dispense Refill   aspirin EC 81 MG tablet Take 81 mg by mouth every evening.      atorvastatin (LIPITOR) 80 MG tablet Take 1 tablet (80 mg total) by mouth daily. 90 tablet 3   clopidogrel (PLAVIX) 75 MG tablet Take 1 tablet by mouth once daily 30 tablet 11   cyproheptadine (PERIACTIN) 4 MG tablet Take 8 mg by mouth 2 (two) times daily.     dapagliflozin propanediol (FARXIGA) 10 MG TABS tablet Take 1 tablet (10 mg total) by mouth daily before breakfast. 30 tablet 11   EPINEPHrine (EPIPEN 2-PAK) 0.3 mg/0.3 mL IJ SOAJ injection Inject 0.3 mg into the muscle as needed for anaphylaxis. 2 each 2   Evolocumab (REPATHA SURECLICK) 140 MG/ML SOAJ Inject 140 mg into the skin every 14 (fourteen) days. 6 mL 3    fenofibrate (TRICOR) 145 MG tablet Take 1 tablet (145 mg total) by mouth daily. 90 tablet 3   furosemide (LASIX) 20 MG tablet Take 2 tablets (40 mg total) by mouth 2 (two) times daily. 60 tablet 6   gabapentin (NEURONTIN) 300 MG capsule Take 1 capsule by mouth at bedtime 30 capsule 0   hydrOXYzine (VISTARIL) 25 MG capsule Take 1 capsule (25 mg total) by mouth every 8 (eight) hours as needed. 30 capsule 1   linaclotide (LINZESS) 145 MCG CAPS capsule Take 1 capsule (145 mcg total) by mouth daily before breakfast. 30 capsule 11   loperamide (IMODIUM A-D) 2 MG tablet Take 2 mg by mouth 4 (four) times daily as needed for diarrhea or loose stools.     losartan (COZAAR) 25 MG tablet Take 0.5 tablets (12.5 mg total) by mouth at bedtime.     metoprolol succinate (TOPROL XL) 25 MG 24 hr tablet Take 1 tablet (25 mg total) by mouth daily. (Patient taking differently: Take 12.5 mg by mouth daily.) 90 tablet 3   Multiple Vitamins-Minerals (MULTIVITAMIN WITH MINERALS) tablet Take 1 tablet by mouth daily.     nitroGLYCERIN (NITROSTAT) 0.4 MG SL tablet Place 1 tablet (0.4 mg total) under the tongue every 5 (five) minutes x 3 doses as needed for chest pain (if no relief after 2nd dose, proceed to the ED for an evaluation or call 911). 25 tablet 2   ondansetron (ZOFRAN) 4 MG tablet Take 1 tablet (4 mg total) by mouth every 6 (six) hours as needed for nausea or vomiting. 60 tablet 5   pantoprazole (PROTONIX) 40 MG tablet TAKE 1 TABLET BY MOUTH TWICE DAILY BEFORE A MEAL 60 tablet 0   potassium chloride SA (KLOR-CON M) 20 MEQ tablet Take 2 tablets (40 mEq total) by mouth 2 (two) times daily. 120 tablet 6   rOPINIRole (REQUIP) 0.5 MG tablet Take 0.5 mg by mouth at bedtime.   0   scopolamine (TRANSDERM-SCOP) 1 MG/3DAYS Place 1 patch (1.5 mg total) onto the skin every 3 (three) days. 10  patch 5   sertraline (ZOLOFT) 25 MG tablet Take 1 tablet (25 mg total) by mouth daily. 30 tablet 3   spironolactone (ALDACTONE) 25 MG tablet  TAKE 1 TABLET BY MOUTH AT BEDTIME 30 tablet 0   sucralfate (CARAFATE) 1 GM/10ML suspension Take 10 mLs (1 g total) by mouth 4 (four) times daily. 420 mL 1   traMADol (ULTRAM) 50 MG tablet TAKE 1 TABLET BY MOUTH EVERY 6 HOURS AS NEEDED 30 tablet 0   varenicline (CHANTIX) 1 MG tablet Take 1 tablet (1 mg total) by mouth 2 (two) times daily. 60 tablet 3   Vitamin D, Ergocalciferol, (DRISDOL) 1.25 MG (50000 UNIT) CAPS capsule Take 50,000 Units by mouth every 7 (seven) days.     No current facility-administered medications for this encounter.   Wt Readings from Last 3 Encounters:  05/26/23 50.3 kg (110 lb 12.8 oz)  04/20/23 49 kg (108 lb 1.3 oz)  04/15/23 50.6 kg (111 lb 9.6 oz)   BP 128/82   Pulse 94   Wt 50.3 kg (110 lb 12.8 oz)   SpO2 99%   BMI 21.64 kg/m   Orthostatics today 05/26/23 Lying: 119/80, 89 Sitting: 119/76, 91 Standing: 117/80, 96  Physical Exam General:  NAD. No resp difficulty, walked into clinic, chronically-ill appearing HEENT: Normal Neck: Supple. No JVD. Carotids 2+ bilat; no bruits. No lymphadenopathy or thryomegaly appreciated. Cor: PMI nondisplaced. Regular rate & rhythm. No rubs, gallops or murmurs. Lungs: Faint wheezes upper lobes Abdomen: Soft, nontender, nondistended. No hepatosplenomegaly. No bruits or masses. Good bowel sounds. Extremities: No cyanosis, clubbing, rash, edema Neuro: Alert & oriented x 3, cranial nerves grossly intact. Moves all 4 extremities w/o difficulty. Affect pleasant.  Assessment/Plan: 1. CAD: S/p NSTEMI in 11/17 with DEX to LCx and DES x 2 to RCA.  Cardiolite in 10/20 with EF <30%, inferior/inferolateral infarct with peri-infarct ischemia. LHC in 4/21 showed nonobstructive mild CAD.  No chest pain.  - Off [name] 2/2 AlphaGal.  - Continue atorvastatin + fenofibrate + Repatha. Good lipids in 6/24.  - Continue ASA 81 and Plavix 75 mg daily for now.  She will continue Plavix as long as Dr. Corliss Skains feels it is needed post her cranial  circulation intervention.  2. Chronic systolic CHF: Ischemic cardiomyopathy.  RHC in 4/21 with normal filling pressures and preserved cardiac output.  Echo 2/24 showed stable EF 30-35%. NYHA II symptoms. She is not volume overloaded by OptiVol, may be on the dry side. - Orthostatics in clinic today are negative. - With what sounds like orthostasis at home, decrease Lasix to 40/20. BMET and BNP today. - Continue Farixga 10 mg daily.   - Continue losartan 12.5 mg at bedtime (unable to take Entresto in past due to hypotension).  - Continue Toprol XL 12.5 mg daily. Will not increase today with soft BP at home. - Continue spironolactone 25 mg at bedtime.   - I asked her to check her BP at home and log. Notify clinic if sBP < 95 - Due to repeat echo. 3. Carotid stenosis: Carotid dopplers 12/24 showed 40-59% LICA stenosis - Repeat in 1 year 4. Smoking: She quit in 10/24, but now restarted. - Discussed cessation, she is wearing nicotine patches. 5. 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  - Continue Plavix per Dr. Corliss Skains.   Follow up in 3 months with Dr. Kathreen Cornfield [facility] FNP-BC 05/26/2023

## 2023-05-25 ENCOUNTER — Ambulatory Visit (HOSPITAL_COMMUNITY): Payer: Medicare PPO | Admitting: Occupational Therapy

## 2023-05-25 ENCOUNTER — Encounter (HOSPITAL_COMMUNITY): Payer: Self-pay | Admitting: Occupational Therapy

## 2023-05-25 DIAGNOSIS — M25612 Stiffness of left shoulder, not elsewhere classified: Secondary | ICD-10-CM | POA: Diagnosis not present

## 2023-05-25 DIAGNOSIS — M25512 Pain in left shoulder: Secondary | ICD-10-CM | POA: Diagnosis not present

## 2023-05-25 DIAGNOSIS — I5022 Chronic systolic (congestive) heart failure: Secondary | ICD-10-CM | POA: Diagnosis not present

## 2023-05-25 DIAGNOSIS — Z95810 Presence of automatic (implantable) cardiac defibrillator: Secondary | ICD-10-CM | POA: Diagnosis not present

## 2023-05-25 DIAGNOSIS — R29898 Other symptoms and signs involving the musculoskeletal system: Secondary | ICD-10-CM | POA: Diagnosis not present

## 2023-05-25 NOTE — Progress Notes (Signed)
Please inform the patient that the cystic lesion observed is consistent with cystic bronchiectasis, which can occur after infections, inflammatory conditions, or obstructions.  I recommend a repeat CT scan in six months to monitor for further changes, stability, or resolution.

## 2023-05-25 NOTE — Therapy (Signed)
OUTPATIENT OCCUPATIONAL THERAPY ORTHO TREATMENT NOTE  Patient Name: Erin [name] MRN: [id number] DOB:[birth year], 63 y.o., female Today's Date: 05/26/2023   END OF SESSION:  OT End of Session - 05/25/23 1147     Visit Number 6    Number of Visits 17    Date for OT Re-Evaluation 06/09/23    Authorization Type [employer]    Progress Note Due on Visit 10    OT Start Time 1109    OT Stop Time 1147    OT Time Calculation (min) 38 min    Activity Tolerance Patient tolerated treatment well    Behavior During Therapy WFL for tasks assessed/performed             Past Medical History:  Diagnosis Date   AICD (automatic cardioverter/defibrillator) present    Allergic reaction to alpha-gal    Allergy 03/05/2022   Anemia    Anxiety state 03/25/2016   Arthritis    Basal cell carcinoma of forehead    Brain aneurysm    CHF (congestive heart failure) (HCC)    Coronary artery disease    a. 03/11/16 PCI with DES-->Prox/Mid Cx;  b. 03/14/16 PCI with DES x2-->RCA, EF 30-35%.   Depression 06/09/2010   Encounter for general adult medical examination with abnormal findings 05/04/2022   Essential hypertension    Hx   GERD (gastroesophageal reflux disease)    HFrEF (heart failure with reduced ejection fraction) (HCC)    a. 10/2016 Echo: EF 35-40%, Gr1 DD, mild focal basal septal hypertrophy, basal inflat, mid inflat, basal antlat AK. Mid infept/inf/antlat, apical lateral sev HK. Mod MR. mildly reduced RV fxn. Mild TR.   History of pneumonia    Hyperlipidemia    IBS (irritable bowel syndrome)    Ischemic cardiomyopathy    a. 10/2016 Echo: EF 35-40%, Gr1 DD.   Mitral regurgitation    Neuromuscular disorder (HCC)    NSTEMI (non-ST elevated myocardial infarction) (HCC) 03/10/2016   Pneumonia 03/2016   Shingles    Squamous cell cancer of skin of nose    Thrombocytosis 03/26/2016   Tobacco abuse    Trichimoniasis    Wears dentures    Wears glasses    Past Surgical History:   Procedure Laterality Date   APPENDECTOMY     BICEPT TENODESIS Left 03/21/2023   Procedure: BICEPS TENODESIS;  Surgeon: Cammy Copa, MD;  Location: [facility] OR;  Service: Orthopedics;  Laterality: Left;   BIOPSY  09/20/2018   Procedure: BIOPSY;  Surgeon: Corbin Ade, MD;  [facility];  Service: Endoscopy;;  colon   BIOPSY  01/05/2021   Procedure: BIOPSY;  Surgeon: Rachael Fee, MD;  Location: [facility] ENDOSCOPY;  Service: Endoscopy;;   BIOPSY  04/27/2022   Procedure: BIOPSY;  Surgeon: Lanelle Bal, DO;  Location: [facility];  Service: Endoscopy;;   CARDIAC CATHETERIZATION N/A 03/11/2016   Procedure: Left Heart Cath and Coronary Angiography;  Surgeon: Marykay Lex, MD;  Location: [facility] INVASIVE CV LAB;  Service: Cardiovascular;  Laterality: N/A;   CARDIAC CATHETERIZATION N/A 03/11/2016   Procedure: Coronary Stent Intervention;  Surgeon: Marykay Lex, MD;  Location: [facility] INVASIVE CV LAB;  Service: Cardiovascular;  Laterality: N/A;   CARDIAC CATHETERIZATION N/A 03/14/2016   Procedure: Coronary Stent Intervention;  Surgeon: Peter M [place], MD;  Location: [facility] INVASIVE CV LAB;  Service: Cardiovascular;  Laterality: N/A;   CEREBRAL ANEURYSM REPAIR  04/2019   stent placed   CHOLECYSTECTOMY OPEN  1984  COLONOSCOPY WITH PROPOFOL N/A 09/20/2018   Procedure: COLONOSCOPY WITH PROPOFOL;  Surgeon: Corbin Ade, MD;  [facility];  Service: Endoscopy;  Laterality: N/A;  10:30am   CORONARY ANGIOPLASTY WITH STENT PLACEMENT  03/14/2016   ESOPHAGOGASTRODUODENOSCOPY (EGD) WITH PROPOFOL N/A 09/20/2018   Procedure: ESOPHAGOGASTRODUODENOSCOPY (EGD) WITH PROPOFOL;  Surgeon: Corbin Ade, MD;  [facility];  Service: Endoscopy;  Laterality: N/A;   ESOPHAGOGASTRODUODENOSCOPY (EGD) WITH PROPOFOL N/A 01/05/2021   Procedure: ESOPHAGOGASTRODUODENOSCOPY (EGD) WITH PROPOFOL;  Surgeon: Rachael Fee, MD;  Location: [facility] ENDOSCOPY;  Service: Endoscopy;  Laterality: N/A;    ESOPHAGOGASTRODUODENOSCOPY (EGD) WITH PROPOFOL N/A 04/27/2022   Procedure: ESOPHAGOGASTRODUODENOSCOPY (EGD) WITH PROPOFOL;  Surgeon: Lanelle Bal, DO;  [facility];  Service: Endoscopy;  Laterality: N/A;  9:15am, asa 3/4, ASAP   ESOPHAGOGASTRODUODENOSCOPY (EGD) WITH PROPOFOL N/A 01/04/2023   Procedure: ESOPHAGOGASTRODUODENOSCOPY (EGD) WITH PROPOFOL;  Surgeon: Corbin Ade, MD;  [facility];  Service: Endoscopy;  Laterality: N/A;  130pm, asa 3   FINGER ARTHROPLASTY Left 05/14/2013   Procedure: LEFT THUMB CARPAL METACARPAL ARTHROPLASTY;  Surgeon: Tami Ribas, MD;  Location:  SURGERY CENTER;  Service: Orthopedics;  Laterality: Left;   ICD IMPLANT N/A 04/03/2020   Procedure: ICD IMPLANT;  Surgeon: Regan Lemming, MD;  Location: [facility] INVASIVE CV LAB;  Service: Cardiovascular;  Laterality: N/A;   IR ANGIO INTRA EXTRACRAN SEL COM CAROTID INNOMINATE BILAT MOD SED  01/05/2017   IR ANGIO INTRA EXTRACRAN SEL COM CAROTID INNOMINATE BILAT MOD SED  03/19/2019   IR ANGIO INTRA EXTRACRAN SEL COM CAROTID INNOMINATE BILAT MOD SED  06/04/2020   IR ANGIO INTRA EXTRACRAN SEL INTERNAL CAROTID UNI L MOD SED  04/15/2019   IR ANGIO VERTEBRAL SEL VERTEBRAL BILAT MOD SED  01/05/2017   IR ANGIO VERTEBRAL SEL VERTEBRAL BILAT MOD SED  03/19/2019   IR ANGIO VERTEBRAL SEL VERTEBRAL UNI L MOD SED  06/04/2020   IR ANGIOGRAM FOLLOW UP STUDY  04/15/2019   IR RADIOLOGIST EVAL & MGMT  12/30/2016   IR TRANSCATH/EMBOLIZ  04/15/2019   IR US GUIDE VASC ACCESS RIGHT  03/19/2019   IR US GUIDE VASC ACCESS RIGHT  06/04/2020   MALONEY DILATION N/A 09/20/2018   Procedure: Elease Hashimoto DILATION;  Surgeon: Corbin Ade, MD;  [facility];  Service: Endoscopy;  Laterality: N/A;   MALONEY DILATION N/A 01/04/2023   Procedure: Elease Hashimoto DILATION;  Surgeon: Corbin Ade, MD;  [facility];  Service: Endoscopy;  Laterality: N/A;   RADIOLOGY WITH ANESTHESIA N/A 04/15/2019    Procedure: Sharman Crate;  Surgeon: Julieanne Cotton, MD;  Location: [facility] OR;  Service: Radiology;  Laterality: N/A;   REVERSE SHOULDER ARTHROPLASTY Left 03/21/2023   Procedure: LEFT REVERSE SHOULDER ARTHROPLASTY;  Surgeon: Cammy Copa, MD;  Location: [facility] OR;  Service: Orthopedics;  Laterality: Left;   RIGHT/LEFT HEART CATH AND CORONARY ANGIOGRAPHY N/A 08/19/2019   Procedure: RIGHT/LEFT HEART CATH AND CORONARY ANGIOGRAPHY;  Surgeon: Laurey Morale, MD;  Location: [facility] INVASIVE CV LAB;  Service: Cardiovascular;  Laterality: N/A;   TUBAL LIGATION  1987   VAGINAL HYSTERECTOMY  2009   Patient Active Problem List   Diagnosis Date Noted   Panic attacks 04/21/2023   Multiple pulmonary nodules determined by computed tomography of lung 04/21/2023   Arthritis of left shoulder region 04/02/2023   Biceps tendonitis on left 04/02/2023   OA (osteoarthritis) of shoulder 03/21/2023   S/P reverse total shoulder arthroplasty, left 03/21/2023  RUQ pain 06/29/2022   Pancreatic cyst 06/29/2022   Peripheral neuropathy 05/04/2022   Restless leg syndrome 05/04/2022   Hypertriglyceridemia 05/04/2022   Bilateral shoulder pain 05/04/2022   Encounter for general adult medical examination with abnormal findings 05/04/2022   H/O total hysterectomy 04/26/2022   Odynophagia 04/20/2022   Abdominal pain, epigastric 04/20/2022   Nausea without vomiting 06/16/2021   Allergy to alpha-gal 06/16/2021   Orthostatic hypotension 06/16/2021   Hypocortisolemia (HCC) 03/02/2021   Adrenal adenoma, left 03/02/2021   AKI (acute kidney injury) (HCC) 01/04/2021   Intractable nausea and vomiting 01/03/2021   Left-sided weakness 10/19/2020   Cough 02/11/2020   Near syncope 12/10/2019   Brain aneurysm 04/15/2019   Dysphagia 02/27/2018   Encounter for screening colonoscopy 02/27/2018   History of Clostridium difficile infection 02/27/2018   Chronic diarrhea 12/20/2017   Chronic combined systolic and diastolic congestive  heart failure (HCC) 06/20/2016   Hypokalemia due to excessive gastrointestinal loss of potassium    Acute CHF (congestive heart failure) (HCC) 05/16/2016   Acute on chronic systolic CHF (congestive heart failure) (HCC) 05/16/2016   Acute respiratory failure with hypoxia (HCC)    Thrombocytosis 03/26/2016   Cardiomyopathy, ischemic 03/25/2016   Chronic combined systolic and diastolic heart failure (HCC) 03/25/2016   Anxiety state 03/25/2016   Troponin level elevated 03/25/2016   Coronary artery disease involving native coronary artery of native heart 03/25/2016   Normocytic anemia 03/25/2016   SOB (shortness of breath) 03/24/2016   Lightheadedness 03/17/2016   Hypotension 03/17/2016   Tobacco abuse 03/12/2016   NSTEMI (non-ST elevated myocardial infarction) (HCC) 03/11/2016   Atypical chest pain    Essential hypertension 09/06/2015   Hyperlipidemia 09/06/2015   GERD without esophagitis 09/06/2015   Chest pain 09/06/2015    PCP: Christel Mormon, MD REFERRING PROVIDER: Lenice Pressman, MD  ONSET DATE: 03/21/23  REFERRING DIAG: L Reverse Total Shoulder  THERAPY DIAG:  Acute pain of left shoulder  Stiffness of left shoulder, not elsewhere classified  Other symptoms and signs involving the musculoskeletal system  Rationale for Evaluation and Treatment: Rehabilitation  SUBJECTIVE:   SUBJECTIVE STATEMENT: "It's hurting worse in my elbow today" Pt accompanied by: self  PERTINENT HISTORY: Pt reports L shoulder pain for ~7 years with worsening pain. This past year she reports limiting ability to cook, clean, and complete certain parts of dressing and bathing. On 03/21/23, pt s/p L Reverse Total Shoulder.   PRECAUTIONS: Shoulder  WEIGHT BEARING RESTRICTIONS: Yes >1lb  PAIN:  Are you having pain? Yes: NPRS scale: 4/10 Pain location: Deltoid Pain description: dull ache Aggravating factors: Cold, supine position Relieving factors: medication  FALLS: Has patient fallen in  last 6 months? No  PLOF: Independent  PATIENT GOALS: "To be able to fasten my own bra"  NEXT MD VISIT: 06/19/23  OBJECTIVE:   HAND DOMINANCE: Right  ADLs: Overall ADLs: Pt reports inability to clasp her bra, put her hair up, or wash her hair. Due to precautions and pain/ROM limits she is unable to lift and carry any items heavier than her cell phone.   FUNCTIONAL OUTCOME MEASURES: FOTO: 42.21  UPPER EXTREMITY ROM:       Assessed in supine, er/IR adducted  Passive ROM Left eval  Shoulder flexion 99  Shoulder abduction 78  Shoulder internal rotation 90  Shoulder external rotation 24  (Blank rows = not tested)    UPPER EXTREMITY MMT:     Assessed in seated, er/IR adducted  MMT Left eval  Shoulder flexion  Shoulder abduction   Shoulder internal rotation   Shoulder external rotation   Middle trapezius   Lower trapezius   (Blank rows = not tested)  SENSATION: Mild numbness and tingling noted intermittently in finger tips and elbow  EDEMA: no swelling noted  OBSERVATIONS: Moderate fascial restrictions    TODAY'S TREATMENT:                                                                                                                              DATE:   05/25/23 -manual therapy: myofascial release and trigger point applied to biceps, trapezius, deltoid, and scapular region for improving pain and fascial restrictions, as well as ROM -A/ROM: supine-protraction, flexion, abduction, er, horizontal abduction, x15 -Theraball Exercises: basket ball (attempted green theraball max difficulty), flexion, protraction, overhead press, V ups, circles both directions, x12 -UBE: Level 1, 2.5' forwards and backwards, pace: 3.5+  05/18/23 -manual therapy: myofascial release and trigger point applied to biceps, trapezius, deltoid, and scapular region for improving pain and fascial restrictions, as well as ROM -P/ROM: supine-flexion, abduction, horizontal abduction, er, 5  reps -A/ROM: supine-protraction, flexion, abduction, er, horizontal abduction, 12 reps -Proximal shoulder strengthening: supine-paddles, criss cross, circles each direction, 10 reps -A/ROM: standing-protraction, flexion, abduction, er, horizontal abduction, 10 reps -Wall wash: 1' -Proximal shoulder strengthening: standing at doorway, shoulder at 90 degrees flexion, 1'  -UBE: level 1, 2' forward, 2' reverse, pace: 4.5 -Scapular theraband: red-row, extension, retraction, 10 reps  05/09/23 -manual therapy: myofascial release and trigger point applied to biceps, trapezius, deltoid, and scapular region for improving pain and fascial restrictions, as well as ROM -AA/ROM: supine, flexion, abduction, protraction, horizontal abduction, er/IR x12 -A/ROM: supine, flexion, abduction, protraction, horizontal abduction, er/IR, x8 - Wall wash: 60" flexion and abduction   - Functional reach: reaching into cabinet (second and third shelf) 5 items each  - UBE: 3 mins forwards 3 mins backwards level 2.5   PATIENT EDUCATION: Education details: reviewed HEP Person educated: Patient Education method: Explanation, Demonstration, and Handouts Education comprehension: verbalized understanding and returned demonstration  HOME EXERCISE PROGRAM: 12/2: Table Slides 12/9: Pendulums 12/16: AA/ROM 12/30: A/ROM   GOALS: Goals reviewed with patient? Yes   SHORT TERM GOALS: Target date: 05/08/23  Pt will be provided with and educated on HEP to improve mobility in LUE required for use during ADL completion.   Goal status: IN PROGRESS  2.  Pt will increase LUE P/ROM by 30 degrees to improve ability to use LUE during dressing tasks with minimal compensatory techniques.   Goal status: IN PROGRESS  3.  Pt will increase LUE strength to 3+/5 to improve ability to reach for items at waist to chest height during bathing and grooming tasks.   Goal status: IN PROGRESS   LONG TERM GOALS: Target date: 06/09/23  Pt  will decrease pain in LUE to 3/10 or less to improve ability to sleep for 2+ consecutive hours without waking due to pain.   Goal  status: IN PROGRESS  2.  Pt will decrease LUE fascial restrictions to min amounts or less to improve mobility required for functional reaching tasks.   Goal status: IN PROGRESS  3.  Pt will increase LUE A/ROM by 45 degrees to improve ability to use LUE when reaching overhead or behind back during dressing and bathing tasks.   Goal status: IN PROGRESS  4.  Pt will increase LUE strength to 4+/5 or greater to improve ability to use LUE when lifting or carrying items during meal preparation/housework/yardwork tasks.   Goal status: IN PROGRESS  5.  Pt will return to highest level of function using LUE as non-dominant during functional task completion.   Goal status: IN PROGRESS   ASSESSMENT:  CLINICAL IMPRESSION: This session, pt having increased pain in her elbow, limiting shoulder ROM due to pain when extending elbow. She required multiple rest breaks due to elbow pain, as well as modified strategies to keep the elbow slightly bent through most exercises. OT added theraball exercises to continue working on improving strength, ROM, while being gentle on the elbow. Verbal and tactile cuing provided for positioning and technique.   PERFORMANCE DEFICITS: in functional skills including in functional skills including ADLs, IADLs, coordination, tone, ROM, strength, pain, fascial restrictions, muscle spasms, and UE functional use.   PLAN:  OT FREQUENCY: 2x/week  OT DURATION: 8 weeks  PLANNED INTERVENTIONS: 97168 OT Re-evaluation, 97535 self care/ADL training, 16109 therapeutic exercise, 97530 therapeutic activity, 97140 manual therapy, 97035 ultrasound, 97010 moist heat, 97032 electrical stimulation (manual), passive range of motion, energy conservation, coping strategies training, patient/family education, and DME and/or AE instructions  CONSULTED AND AGREED  WITH PLAN OF CARE: Patient  PLAN FOR NEXT SESSION: Manual Therapy, P/ROM, A/ROM, proximal shoulder strengthening, functional reaching, add shoulder stretches   Trish Mage, OTR/L [phone] 05/26/2023, 2:22 PM

## 2023-05-26 ENCOUNTER — Ambulatory Visit (HOSPITAL_COMMUNITY)
Admission: RE | Admit: 2023-05-26 | Discharge: 2023-05-26 | Disposition: A | Discharge status: Home / Self Care | Payer: Medicare PPO | Source: Ambulatory Visit | Attending: Family Medicine

## 2023-05-26 ENCOUNTER — Encounter (HOSPITAL_COMMUNITY): Payer: Self-pay

## 2023-05-26 VITALS — BP 128/82 | HR 94 | Wt 110.8 lb

## 2023-05-26 DIAGNOSIS — Z955 Presence of coronary angioplasty implant and graft: Secondary | ICD-10-CM | POA: Insufficient documentation

## 2023-05-26 DIAGNOSIS — I252 Old myocardial infarction: Secondary | ICD-10-CM | POA: Insufficient documentation

## 2023-05-26 DIAGNOSIS — I671 Cerebral aneurysm, nonruptured: Secondary | ICD-10-CM

## 2023-05-26 DIAGNOSIS — I959 Hypotension, unspecified: Secondary | ICD-10-CM | POA: Diagnosis not present

## 2023-05-26 DIAGNOSIS — Z79899 Other long term (current) drug therapy: Secondary | ICD-10-CM | POA: Insufficient documentation

## 2023-05-26 DIAGNOSIS — I2510 Atherosclerotic heart disease of native coronary artery without angina pectoris: Secondary | ICD-10-CM

## 2023-05-26 DIAGNOSIS — Z720 Tobacco use: Secondary | ICD-10-CM | POA: Diagnosis not present

## 2023-05-26 DIAGNOSIS — F17210 Nicotine dependence, cigarettes, uncomplicated: Secondary | ICD-10-CM | POA: Insufficient documentation

## 2023-05-26 DIAGNOSIS — Z9889 Other specified postprocedural states: Secondary | ICD-10-CM | POA: Insufficient documentation

## 2023-05-26 DIAGNOSIS — I6529 Occlusion and stenosis of unspecified carotid artery: Secondary | ICD-10-CM | POA: Diagnosis not present

## 2023-05-26 DIAGNOSIS — Z7902 Long term (current) use of antithrombotics/antiplatelets: Secondary | ICD-10-CM | POA: Diagnosis not present

## 2023-05-26 DIAGNOSIS — I5022 Chronic systolic (congestive) heart failure: Secondary | ICD-10-CM

## 2023-05-26 LAB — BASIC METABOLIC PANEL
Anion gap: 10 (ref 5–15)
BUN: 16 mg/dL (ref 8–23)
CO2: 22 mmol/L (ref 22–32)
Calcium: 9.7 mg/dL (ref 8.9–10.3)
Chloride: 101 mmol/L (ref 98–111)
Creatinine, Ser: 1.85 mg/dL — ABNORMAL HIGH (ref 0.44–1.00)
GFR, Estimated: 31 mL/min — ABNORMAL LOW (ref >60)
Glucose, Bld: 89 mg/dL (ref 70–99)
Potassium: 5 mmol/L (ref 3.5–5.1)
Sodium: 133 mmol/L — ABNORMAL LOW (ref 135–145)

## 2023-05-26 LAB — BRAIN NATRIURETIC PEPTIDE: B Natriuretic Peptide: 95.4 pg/mL (ref 0.0–100.0)

## 2023-05-26 MED ORDER — FUROSEMIDE 20 MG PO TABS: ORAL_TABLET | ORAL | 3 refills | Status: DC

## 2023-05-26 NOTE — Progress Notes (Signed)
EPIC Encounter for ICM Monitoring  Patient Name: Erin [name] is a 63 y.o. female Date: 05/26/2023 Primary Care Physican: Billie Lade, MD Primary Cardiologist: Shirlee Latch Electrophysiologist: Elberta Fortis 08/11/2021 Office Weight: 121 lbs. 03/25/2022 Office Weight 125-127 lbs 03/13/2023 Weight: 110 lbs 03/24/2023 Weight: 115 lbs 05/26/2023 Weight: 105 lbs  VT-NS (>4 beats, >200 bpm) 2                                                          Spoke with patient and heart failure questions reviewed.  Transmission results reviewed.  Pt reports new symptoms of heart racing or flutter feeling that comes and goes.  She thought about going to ER but has HF clinic appt today and will wait to be evaluated at that time.        Optivol thoracic impedance suggesting possible fluid accumulation from 12/23-1/12.   Prescribed: Furosemide 20 mg Take 2 tablets (40 mg total) by mouth two times daily Potassium 20 mEq take 2 tablets(40 mEq total) by mouth twice a day Spironolactone 25 mg take 0.5 tablet (12.5 mg total) by mouth daily   Labs: 05/04/2023 Creatinine 1.72, BUN 14, Potassium 4.3, Sodium 4.3, GFR 33 04/24/2023 Creatinine 1.31, BUN 12, Potassium 3.1, Sodium 137, GFR 46 04/14/2023 Creatinine 1.34, BUN 13, Potassium 3.5, Sodium 137, GFR 45 03/26/2023 Creatinine 1.66, BUN 22, Potassium 4.1, Sodium 136, GFR 35 03/14/2023 Creatinine 1.75, BUN 21, Potassium 3.9, Sodium 140, GFR 33  01/24/2023 Creatinine 1.62, BUN 18, Potassium 3.8, Sodium 137, GFR 36  01/12/2023 Creatinine 1.46, BUN 20, Potassium 4.0, Sodium 137, GFR 41  12/19/2022 Creatinine 1.27, BUN 11, Potassium 4.2, Sodium 142, GFR 48 A complete set of results can be found in Results Review.   Recommendations:  Advised to use ER if needed for chest racing/flutter feeling.  She reports she will wait to be evaluated by HF clinic at 2:30 appointment today.    Follow-up plan: ICM clinic phone appointment on 06/26/2023.   91 day device clinic remote  transmission 08/16/2023.     EP/Cardiology Office Visits:  05/26/2023 with HF Clinic.  Recall 06/04/2023 with Dr Elberta Fortis.     Copy of ICM check sent to Dr. Elberta Fortis.    3 month ICM trend: 05/22/2023.    12-14 Month ICM trend:     Karie Soda, RN 05/26/2023 10:57 AM

## 2023-05-26 NOTE — Patient Instructions (Addendum)
Medication Changes:  DECREASE LASIX (FUROSEMIDE) TO 40MG  IN THE MORNING AND 20MG  IN THE EVENING   Lab Work:  Labs done today, your results will be available in MyChart, we will contact you for abnormal readings.  Special Instructions // Education:  PLEASE CHECK BLOOD PRESSURE DAILY AND WRITE THESE NUMBERS DOWN PLEASE CALL THE OFFICE IF YOU ARE HAVING BLOOD PRESSURES LOWER THAN 95 SYSTOLIC (TOP NUMBER)   Follow-Up in: 3 MONTHS WITH DR. Shirlee Latch PLEASE CALL OUR OFFICE AROUND FEBRUARY TO GET SCHEDULED FOR YOUR APPOINTMENT. PHONE NUMBER IS [phone] OPTION 2    At the [facility], you and your health needs are our priority. We have a designated team specialized in the treatment of Heart Failure. This Care Team includes your primary Heart Failure Specialized Cardiologist (physician), Advanced Practice Providers (APPs- Physician Assistants and Nurse Practitioners), and Pharmacist who all work together to provide you with the care you need, when you need it.   You may see any of the following providers on your designated Care Team at your next follow up:  Dr. Arvilla Meres Dr. Marca Ancona Dr. Dorthula Nettles Dr. Theresia Bough Tonye Becket, NP Robbie Lis, [place] [facility] [place], [place] Brynda Peon, NP [place] Lee, NP Karle Plumber, PharmD   Please be sure to bring in all your medications bottles to every appointment.   Need to Contact us:  If you have any questions or concerns before your next appointment please send [place] a message through [place] or call our office at [phone].    TO LEAVE A MESSAGE FOR THE NURSE SELECT OPTION 2, PLEASE LEAVE A MESSAGE INCLUDING: YOUR NAME DATE OF BIRTH CALL BACK NUMBER REASON FOR CALL**this is important as we prioritize the call backs  YOU WILL RECEIVE A CALL BACK THE SAME DAY AS LONG AS YOU CALL BEFORE 4:00 PM

## 2023-05-26 NOTE — Progress Notes (Signed)
LMTRC. TB

## 2023-05-26 NOTE — Addendum Note (Signed)
Encounter addended by: Jacklynn Ganong, FNP on: 05/26/2023 3:42 PM  Actions taken: Clinical Note Signed

## 2023-05-29 ENCOUNTER — Encounter (HOSPITAL_COMMUNITY): Payer: Medicare PPO | Admitting: Occupational Therapy

## 2023-06-01 ENCOUNTER — Ambulatory Visit (INDEPENDENT_AMBULATORY_CARE_PROVIDER_SITE_OTHER): Payer: Medicare PPO | Admitting: Internal Medicine

## 2023-06-01 ENCOUNTER — Encounter (HOSPITAL_COMMUNITY): Payer: Medicare PPO | Admitting: Occupational Therapy

## 2023-06-01 ENCOUNTER — Encounter: Payer: Self-pay | Admitting: Internal Medicine

## 2023-06-01 VITALS — BP 101/69 | HR 84 | Ht 60.0 in | Wt 109.6 lb

## 2023-06-01 DIAGNOSIS — F5104 Psychophysiologic insomnia: Secondary | ICD-10-CM

## 2023-06-01 DIAGNOSIS — Z85828 Personal history of other malignant neoplasm of skin: Secondary | ICD-10-CM

## 2023-06-01 DIAGNOSIS — F410 Panic disorder [episodic paroxysmal anxiety] without agoraphobia: Secondary | ICD-10-CM | POA: Diagnosis not present

## 2023-06-01 DIAGNOSIS — F411 Generalized anxiety disorder: Secondary | ICD-10-CM | POA: Diagnosis not present

## 2023-06-01 MED ORDER — TRAZODONE HCL 50 MG PO TABS: 50.0000 mg | ORAL_TABLET | Freq: Every day | ORAL | 1 refills | Status: DC

## 2023-06-01 MED ORDER — SERTRALINE HCL 50 MG PO TABS: 50.0000 mg | ORAL_TABLET | Freq: Every day | ORAL | 3 refills | Status: DC

## 2023-06-01 NOTE — Patient Instructions (Signed)
It was a pleasure to see you today.  Thank you for giving [place] the opportunity to be involved in your care.  Below is a brief recap of your visit and next steps.  We will plan to see you again in 6 weeks.  Summary Increase sertraline to 50 mg daily Add trazodone 50 mg nightly Dermatology referral today Follow up in 6 weeks

## 2023-06-01 NOTE — Progress Notes (Signed)
 Established Patient Office Visit  Subjective   Patient ID: Erin [name], female    DOB: [birth year]  Age: 63 y.o. MRN: [id number]  Chief Complaint  Patient presents with   Care Management    Six week follow up    Erin [name] returns to care today for follow-up of anxiety with panic attacks.  She was last evaluated at [facility] on 04/20/23 for ER follow-up at which time she endorsed difficulty sleeping that she attributed to anxiety related to family members.  Ultimately, sertraline 25 mg daily was prescribed and 6-week follow-up was arranged for reassessment.  She was also referred to pulmonology in the setting of multiple atypical pulmonary cysts noted on recent CTA chest.  Dedicated CT chest without contrast redemonstrated these nodules.  Repeat CT chest recommended for 6 months.  Today Ms. Erin [name] reports feeling fairly well.  She feels that her anxiety remains poorly controlled.  She continues to endorse insomnia.  She is currently scheduled to see psychiatry on 2/3.  Her additional concern today is requesting a referral to dermatology in the setting of a history of basal cell carcinoma.  Past Medical History:  Diagnosis Date   AICD (automatic cardioverter/defibrillator) present    Allergic reaction to alpha-gal    Allergy 03/05/2022   Anemia    Anxiety state 03/25/2016   Arthritis    Basal cell carcinoma of forehead    Brain aneurysm    CHF (congestive heart failure) (HCC)    Coronary artery disease    a. 03/11/16 PCI with DES-->Prox/Mid Cx;  b. 03/14/16 PCI with DES x2-->RCA, EF 30-35%.   Depression 06/09/2010   Encounter for general adult medical examination with abnormal findings 05/04/2022   Essential hypertension    Hx   GERD (gastroesophageal reflux disease)    HFrEF (heart failure with reduced ejection fraction) (HCC)    a. 10/2016 Echo: EF 35-40%, Gr1 DD, mild focal basal septal hypertrophy, basal inflat, mid inflat, basal antlat AK. Mid infept/inf/antlat, apical lateral sev HK.  Mod MR. mildly reduced RV fxn. Mild TR.   History of pneumonia    Hyperlipidemia    IBS (irritable bowel syndrome)    Ischemic cardiomyopathy    a. 10/2016 Echo: EF 35-40%, Gr1 DD.   Lung cancer [facility])    Mitral regurgitation    Neuromuscular disorder (HCC)    NSTEMI (non-ST elevated myocardial infarction) (HCC) 03/10/2016   Pneumonia 03/2016   Shingles    Squamous cell cancer of skin of nose    Thrombocytosis 03/26/2016   Tobacco abuse    Trichimoniasis    Wears dentures    Wears glasses    Past Surgical History:  Procedure Laterality Date   APPENDECTOMY     BICEPT TENODESIS Left 03/21/2023   Procedure: BICEPS TENODESIS;  Surgeon: Cammy Copa, MD;  Location: [facility] OR;  Service: Orthopedics;  Laterality: Left;   BIOPSY  09/20/2018   Procedure: BIOPSY;  Surgeon: Corbin Ade, MD;  Location: [facility];  Service: Endoscopy;;  colon   BIOPSY  01/05/2021   Procedure: BIOPSY;  Surgeon: Rachael Fee, MD;  Location: [facility] ENDOSCOPY;  Service: Endoscopy;;   BIOPSY  04/27/2022   Procedure: BIOPSY;  Surgeon: Lanelle Bal, DO;  Location: [facility];  Service: Endoscopy;;   BRONCHIAL BIOPSY  07/03/2023   Procedure: BRONCHIAL BIOPSIES;  Surgeon: Leslye Peer, MD;  Location: [facility] ENDOSCOPY;  Service: Pulmonary;;   BRONCHIAL BRUSHINGS  07/03/2023   Procedure: BRONCHIAL BRUSHINGS;  Surgeon:  Leslye Peer, MD;  Location: [facility] ENDOSCOPY;  Service: Pulmonary;;   BRONCHIAL NEEDLE ASPIRATION BIOPSY  07/03/2023   Procedure: BRONCHIAL NEEDLE ASPIRATION BIOPSIES;  Surgeon: Leslye Peer, MD;  Location: [facility] ENDOSCOPY;  Service: Pulmonary;;   BRONCHIAL WASHINGS  07/03/2023   Procedure: BRONCHIAL WASHINGS;  Surgeon: Leslye Peer, MD;  Location: [facility] ENDOSCOPY;  Service: Pulmonary;;   CARDIAC CATHETERIZATION N/A 03/11/2016   Procedure: Left Heart Cath and Coronary Angiography;  Surgeon: Marykay Lex, MD;  Location: [facility] INVASIVE CV LAB;  Service: Cardiovascular;  Laterality: N/A;    CARDIAC CATHETERIZATION N/A 03/11/2016   Procedure: Coronary Stent Intervention;  Surgeon: Marykay Lex, MD;  Location: [facility] INVASIVE CV LAB;  Service: Cardiovascular;  Laterality: N/A;   CARDIAC CATHETERIZATION N/A 03/14/2016   Procedure: Coronary Stent Intervention;  Surgeon: Peter M Swaziland, MD;  Location: [facility] INVASIVE CV LAB;  Service: Cardiovascular;  Laterality: N/A;   CEREBRAL ANEURYSM REPAIR  04/2019   stent placed   CHOLECYSTECTOMY OPEN  1984   COLONOSCOPY WITH PROPOFOL N/A 09/20/2018   Procedure: COLONOSCOPY WITH PROPOFOL;  Surgeon: Corbin Ade, MD;  [facility];  Service: Endoscopy;  Laterality: N/A;  10:30am   CORONARY ANGIOPLASTY WITH STENT PLACEMENT  03/14/2016   ESOPHAGOGASTRODUODENOSCOPY (EGD) WITH PROPOFOL N/A 09/20/2018   Procedure: ESOPHAGOGASTRODUODENOSCOPY (EGD) WITH PROPOFOL;  Surgeon: Corbin Ade, MD;  [facility];  Service: Endoscopy;  Laterality: N/A;   ESOPHAGOGASTRODUODENOSCOPY (EGD) WITH PROPOFOL N/A 01/05/2021   Procedure: ESOPHAGOGASTRODUODENOSCOPY (EGD) WITH PROPOFOL;  Surgeon: Rachael Fee, MD;  Location: [facility] ENDOSCOPY;  Service: Endoscopy;  Laterality: N/A;   ESOPHAGOGASTRODUODENOSCOPY (EGD) WITH PROPOFOL N/A 04/27/2022   Procedure: ESOPHAGOGASTRODUODENOSCOPY (EGD) WITH PROPOFOL;  Surgeon: Lanelle Bal, DO;  [facility];  Service: Endoscopy;  Laterality: N/A;  9:15am, asa 3/4, ASAP   ESOPHAGOGASTRODUODENOSCOPY (EGD) WITH PROPOFOL N/A 01/04/2023   Procedure: ESOPHAGOGASTRODUODENOSCOPY (EGD) WITH PROPOFOL;  Surgeon: Corbin Ade, MD;  [facility];  Service: Endoscopy;  Laterality: N/A;  130pm, asa 3   FIDUCIAL MARKER PLACEMENT  07/03/2023   Procedure: FIDUCIAL MARKER PLACEMENT;  Surgeon: Leslye Peer, MD;  Location: [facility] ENDOSCOPY;  Service: Pulmonary;;   FINGER ARTHROPLASTY Left 05/14/2013   Procedure: LEFT THUMB CARPAL METACARPAL ARTHROPLASTY;  Surgeon: Tami Ribas, MD;  Location: [facility];  Service: Orthopedics;  Laterality: Left;   ICD IMPLANT N/A 04/03/2020   Procedure: ICD IMPLANT;  Surgeon: Regan Lemming, MD;  Location: [facility] INVASIVE CV LAB;  Service: Cardiovascular;  Laterality: N/A;   IR ANGIO INTRA EXTRACRAN SEL COM CAROTID INNOMINATE BILAT MOD SED  01/05/2017   IR ANGIO INTRA EXTRACRAN SEL COM CAROTID INNOMINATE BILAT MOD SED  03/19/2019   IR ANGIO INTRA EXTRACRAN SEL COM CAROTID INNOMINATE BILAT MOD SED  06/04/2020   IR ANGIO INTRA EXTRACRAN SEL INTERNAL CAROTID UNI L MOD SED  04/15/2019   IR ANGIO VERTEBRAL SEL VERTEBRAL BILAT MOD SED  01/05/2017   IR ANGIO VERTEBRAL SEL VERTEBRAL BILAT MOD SED  03/19/2019   IR ANGIO VERTEBRAL SEL VERTEBRAL UNI L MOD SED  06/04/2020   IR ANGIOGRAM FOLLOW UP STUDY  04/15/2019   IR RADIOLOGIST EVAL & MGMT  12/30/2016   IR TRANSCATH/EMBOLIZ  04/15/2019   IR US GUIDE VASC ACCESS RIGHT  03/19/2019   IR US GUIDE VASC ACCESS RIGHT  06/04/2020   JOINT REPLACEMENT     MALONEY DILATION N/A 09/20/2018   Procedure: MALONEY DILATION;  Surgeon: Jena Gauss,  Gerrit Friends, MD;  [facility];  Service: Endoscopy;  Laterality: N/A;   MALONEY DILATION N/A 01/04/2023   Procedure: Elease Hashimoto DILATION;  Surgeon: Corbin Ade, MD;  [facility];  Service: Endoscopy;  Laterality: N/A;   RADIOLOGY WITH ANESTHESIA N/A 04/15/2019   Procedure: Sharman Crate;  Surgeon: Julieanne Cotton, MD;  Location: [facility] OR;  Service: Radiology;  Laterality: N/A;   REVERSE SHOULDER ARTHROPLASTY Left 03/21/2023   Procedure: LEFT REVERSE SHOULDER ARTHROPLASTY;  Surgeon: Cammy Copa, MD;  Location: [facility] OR;  Service: Orthopedics;  Laterality: Left;   RIGHT/LEFT HEART CATH AND CORONARY ANGIOGRAPHY N/A 08/19/2019   Procedure: RIGHT/LEFT HEART CATH AND CORONARY ANGIOGRAPHY;  Surgeon: Laurey Morale, MD;  Location: [facility] INVASIVE CV LAB;  Service: Cardiovascular;  Laterality: N/A;   TUBAL LIGATION  1987   VAGINAL HYSTERECTOMY  2009   Social  History   Tobacco Use   Smoking status: Some Days    Current packs/day: 1.50    Average packs/day: 1.5 packs/day for 40.0 years (60.0 ttl pk-yrs)    Types: Cigarettes   Smokeless tobacco: Never   Tobacco comments:    Smokes off and on 06/14/23 [name], CMA   Vaping Use   Vaping status: Never Used  Substance Use Topics   Alcohol use: Not Currently    Comment: occasionally   Drug use: Not Currently    Types: Marijuana   Family History  Problem Relation Age of Onset   Stroke Mother    Hypertension Mother    Diabetes Mother    Heart attack Mother    Heart attack Father    Diabetes Father    Hypertension Father    CAD Father    Colon polyps Father 52       pre-cancerous    Stroke Father    Dementia Father    Hyperlipidemia Father    Arthritis Father    COPD Father        smoked and worked [employer]   Heart disease Father    Breast cancer Maternal Grandmother    Diabetes Maternal Grandmother    Cancer Maternal Grandfather        Tongue and esophageal   Cancer Paternal Grandmother    Anxiety disorder Daughter    Depression Daughter    Anxiety disorder Daughter    Heart failure Other    Colon cancer Neg Hx    Allergies  Allergen Reactions   Alpha-Gal Shortness Of Breath, Nausea And Vomiting and Dermatitis   Other Shortness Of Breath, Diarrhea, Nausea And Vomiting and Nausea Only    All- red meats/dairy  Medications in Capsule form    Tape Other (See Comments)    PEELS SKIN OFF  (PAPER TAPE IS FINE)   Review of Systems  Psychiatric/Behavioral:  The patient is nervous/anxious and has insomnia.      Objective:     BP 101/69 (BP Location: Left Arm, Patient Position: Sitting, Cuff Size: Normal)   Pulse 84   Ht 5' (1.524 m)   Wt 109 lb 9.6 oz (49.7 kg)   SpO2 98%   BMI 21.40 kg/m   Physical Exam Vitals reviewed.  Constitutional:      General: She is not in acute distress.    Appearance: Normal appearance. She is not ill-appearing or  toxic-appearing.  HENT:     Head: Normocephalic and atraumatic.     Right Ear: External ear normal.     Left Ear: External ear normal.  Nose: Nose normal. No congestion or rhinorrhea.     Mouth/Throat:     Mouth: Mucous membranes are moist.     Pharynx: Oropharynx is clear. No oropharyngeal exudate or posterior oropharyngeal erythema.  Eyes:     General: No scleral icterus.    Extraocular Movements: Extraocular movements intact.     Conjunctiva/sclera: Conjunctivae normal.     Pupils: Pupils are equal, round, and reactive to light.  Cardiovascular:     Rate and Rhythm: Normal rate and regular rhythm.     Pulses: Normal pulses.     Heart sounds: Normal heart sounds. No murmur heard.    No friction rub. No gallop.  Pulmonary:     Effort: Pulmonary effort is normal.     Breath sounds: Normal breath sounds. No wheezing, rhonchi or rales.  Abdominal:     General: Abdomen is flat. Bowel sounds are normal. There is no distension.     Palpations: Abdomen is soft.     Tenderness: There is no abdominal tenderness.  Musculoskeletal:        General: No swelling. Normal range of motion.     Cervical back: Normal range of motion.     Right lower leg: No edema.     Left lower leg: No edema.  Lymphadenopathy:     Cervical: No cervical adenopathy.  Skin:    General: Skin is warm and dry.     Capillary Refill: Capillary refill takes less than 2 seconds.     Coloration: Skin is not jaundiced.  Neurological:     General: No focal deficit present.     Mental Status: She is alert and oriented to person, place, and time.  Psychiatric:        Mood and Affect: Mood normal.        Behavior: Behavior normal.     Last CBC Lab Results  Component Value Date   WBC 10.3 07/07/2023   HGB 11.2 (L) 07/07/2023   HCT 33.8 (L) 07/07/2023   MCV 79.3 (L) 07/07/2023   MCH 26.3 07/07/2023   RDW 17.8 (H) 07/07/2023   PLT 448 (H) 07/07/2023   Last metabolic panel Lab Results  Component Value Date    GLUCOSE 101 (H) 07/07/2023   NA 135 07/07/2023   K 3.7 07/07/2023   CL 101 07/07/2023   CO2 25 07/07/2023   BUN 23 07/07/2023   CREATININE 1.38 (H) 07/07/2023   GFRNONAA 44 (L) 07/07/2023   CALCIUM 9.7 07/07/2023   PROT 6.2 (L) 06/18/2023   ALBUMIN 3.4 (L) 06/18/2023   LABGLOB 2.6 06/30/2022   AGRATIO 1.7 06/30/2022   BILITOT 0.7 06/18/2023   ALKPHOS 38 06/18/2023   AST 43 (H) 06/18/2023   ALT 26 06/18/2023   ANIONGAP 9 07/07/2023   Last lipids Lab Results  Component Value Date   CHOL 103 10/31/2022   HDL 52 10/31/2022   LDLCALC 20 10/31/2022   TRIG 198 (H) 10/31/2022   CHOLHDL 2.0 10/31/2022   Last hemoglobin A1c Lab Results  Component Value Date   HGBA1C 6.2 (H) 04/26/2022   Last thyroid functions Lab Results  Component Value Date   TSH 0.702 01/24/2023   Last vitamin D Lab Results  Component Value Date   VD25OH 20.2 (L) 04/26/2022   Last vitamin B12 and Folate Lab Results  Component Value Date   VITAMINB12 373 04/26/2022   FOLATE 13.4 04/26/2022     Assessment & Plan:   Problem List Items Addressed This Visit  Panic attacks   Presenting today for follow-up of anxiety with panic attacks.  Sertraline 25 mg daily was started last month.  6-week follow-up arranged.  She feels that anxiety remains poorly controlled endorse insomnia.  She is scheduled to see psychiatry on 2/3. -Treatment options reviewed.  Through shared decision making, sertraline has been increased to 50 mg daily.  Will also add trazodone 50 mg nightly in the setting of psychophysiologic insomnia. -Follow-up in 6 weeks for reassessment      History of skin cancer   Dermatology referral placed today at the patient's request for routine examination in the setting of a known history of basal cell carcinoma.      Return in about 6 weeks (around 07/13/2023).   Billie Lade, MD

## 2023-06-05 ENCOUNTER — Encounter: Payer: Self-pay | Admitting: Internal Medicine

## 2023-06-05 ENCOUNTER — Encounter (HOSPITAL_COMMUNITY): Payer: Medicare PPO | Admitting: Occupational Therapy

## 2023-06-05 ENCOUNTER — Other Ambulatory Visit: Payer: Self-pay | Admitting: Internal Medicine

## 2023-06-05 DIAGNOSIS — G6289 Other specified polyneuropathies: Secondary | ICD-10-CM

## 2023-06-07 ENCOUNTER — Encounter (HOSPITAL_COMMUNITY): Payer: Self-pay | Admitting: Occupational Therapy

## 2023-06-07 ENCOUNTER — Ambulatory Visit (HOSPITAL_COMMUNITY): Payer: Medicare PPO | Admitting: Occupational Therapy

## 2023-06-07 DIAGNOSIS — R29898 Other symptoms and signs involving the musculoskeletal system: Secondary | ICD-10-CM

## 2023-06-07 DIAGNOSIS — M25512 Pain in left shoulder: Secondary | ICD-10-CM

## 2023-06-07 DIAGNOSIS — M25612 Stiffness of left shoulder, not elsewhere classified: Secondary | ICD-10-CM | POA: Diagnosis not present

## 2023-06-07 DIAGNOSIS — Z95810 Presence of automatic (implantable) cardiac defibrillator: Secondary | ICD-10-CM | POA: Diagnosis not present

## 2023-06-07 DIAGNOSIS — I5022 Chronic systolic (congestive) heart failure: Secondary | ICD-10-CM | POA: Diagnosis not present

## 2023-06-07 NOTE — Therapy (Addendum)
 OUTPATIENT OCCUPATIONAL THERAPY ORTHO TREATMENT NOTE REASSESSMENT/RECERTIFICATION  Patient Name: Erin [name] MRN: [id number] DOB:[birth year], 63 y.o., female Today's Date: 06/07/2023  Progress Note Reporting Period 04/10/23 to 06/07/23  See note below for Objective Data and Assessment of Progress/Goals.    END OF SESSION:  OT End of Session - 06/07/23 1337     Visit Number 7    Number of Visits 17    Date for OT Re-Evaluation 06/09/23    Authorization Type [employer]    Authorization Time Period 17 visits (04/19/23-06/08/23), requesting 8 more visits    Authorization - Visit Number 6    Authorization - Number of Visits 17    Progress Note Due on Visit 10    OT Start Time 1105    OT Stop Time 1145    OT Time Calculation (min) 40 min    Activity Tolerance Patient tolerated treatment well    Behavior During Therapy WFL for tasks assessed/performed             Past Medical History:  Diagnosis Date   AICD (automatic cardioverter/defibrillator) present    Allergic reaction to alpha-gal    Allergy 03/05/2022   Anemia    Anxiety state 03/25/2016   Arthritis    Basal cell carcinoma of forehead    Brain aneurysm    CHF (congestive heart failure) (HCC)    Coronary artery disease    a. 03/11/16 PCI with DES-->Prox/Mid Cx;  b. 03/14/16 PCI with DES x2-->RCA, EF 30-35%.   Depression 06/09/2010   Encounter for general adult medical examination with abnormal findings 05/04/2022   Essential hypertension    Hx   GERD (gastroesophageal reflux disease)    HFrEF (heart failure with reduced ejection fraction) (HCC)    a. 10/2016 Echo: EF 35-40%, Gr1 DD, mild focal basal septal hypertrophy, basal inflat, mid inflat, basal antlat AK. Mid infept/inf/antlat, apical lateral sev HK. Mod MR. mildly reduced RV fxn. Mild TR.   History of pneumonia    Hyperlipidemia    IBS (irritable bowel syndrome)    Ischemic cardiomyopathy    a. 10/2016 Echo: EF 35-40%, Gr1 DD.   Mitral  regurgitation    Neuromuscular disorder (HCC)    NSTEMI (non-ST elevated myocardial infarction) (HCC) 03/10/2016   Pneumonia 03/2016   Shingles    Squamous cell cancer of skin of nose    Thrombocytosis 03/26/2016   Tobacco abuse    Trichimoniasis    Wears dentures    Wears glasses    Past Surgical History:  Procedure Laterality Date   APPENDECTOMY     BICEPT TENODESIS Left 03/21/2023   Procedure: BICEPS TENODESIS;  Surgeon: Cammy Copa, MD;  Location: [facility] OR;  Service: Orthopedics;  Laterality: Left;   BIOPSY  09/20/2018   Procedure: BIOPSY;  Surgeon: Corbin Ade, MD;  Location: [facility];  Service: Endoscopy;;  colon   BIOPSY  01/05/2021   Procedure: BIOPSY;  Surgeon: Rachael Fee, MD;  Location: [facility] ENDOSCOPY;  Service: Endoscopy;;   BIOPSY  04/27/2022   Procedure: BIOPSY;  Surgeon: Lanelle Bal, DO;  Location: [facility];  Service: Endoscopy;;   CARDIAC CATHETERIZATION N/A 03/11/2016   Procedure: Left Heart Cath and Coronary Angiography;  Surgeon: Marykay Lex, MD;  Location: [facility] INVASIVE CV LAB;  Service: Cardiovascular;  Laterality: N/A;   CARDIAC CATHETERIZATION N/A 03/11/2016   Procedure: Coronary Stent Intervention;  Surgeon: Marykay Lex, MD;  Location: [facility] INVASIVE CV LAB;  Service: Cardiovascular;  Laterality: N/A;   CARDIAC CATHETERIZATION N/A 03/14/2016   Procedure: Coronary Stent Intervention;  Surgeon: Peter M Swaziland, MD;  Location: [facility] INVASIVE CV LAB;  Service: Cardiovascular;  Laterality: N/A;   CEREBRAL ANEURYSM REPAIR  04/2019   stent placed   CHOLECYSTECTOMY OPEN  1984   COLONOSCOPY WITH PROPOFOL N/A 09/20/2018   Procedure: COLONOSCOPY WITH PROPOFOL;  Surgeon: Corbin Ade, MD;  Location: [facility];  Service: Endoscopy;  Laterality: N/A;  10:30am   CORONARY ANGIOPLASTY WITH STENT PLACEMENT  03/14/2016   ESOPHAGOGASTRODUODENOSCOPY (EGD) WITH PROPOFOL N/A 09/20/2018   Procedure: ESOPHAGOGASTRODUODENOSCOPY (EGD) WITH PROPOFOL;   Surgeon: Corbin Ade, MD;  Location: [facility];  Service: Endoscopy;  Laterality: N/A;   ESOPHAGOGASTRODUODENOSCOPY (EGD) WITH PROPOFOL N/A 01/05/2021   Procedure: ESOPHAGOGASTRODUODENOSCOPY (EGD) WITH PROPOFOL;  Surgeon: Rachael Fee, MD;  Location: [facility] ENDOSCOPY;  Service: Endoscopy;  Laterality: N/A;   ESOPHAGOGASTRODUODENOSCOPY (EGD) WITH PROPOFOL N/A 04/27/2022   Procedure: ESOPHAGOGASTRODUODENOSCOPY (EGD) WITH PROPOFOL;  Surgeon: Lanelle Bal, DO;  Location: [facility];  Service: Endoscopy;  Laterality: N/A;  9:15am, asa 3/4, ASAP   ESOPHAGOGASTRODUODENOSCOPY (EGD) WITH PROPOFOL N/A 01/04/2023   Procedure: ESOPHAGOGASTRODUODENOSCOPY (EGD) WITH PROPOFOL;  Surgeon: Corbin Ade, MD;  Location: [facility];  Service: Endoscopy;  Laterality: N/A;  130pm, asa 3   FINGER ARTHROPLASTY Left 05/14/2013   Procedure: LEFT THUMB CARPAL METACARPAL ARTHROPLASTY;  Surgeon: Tami Ribas, MD;  Location: [facility];  Service: Orthopedics;  Laterality: Left;   ICD IMPLANT N/A 04/03/2020   Procedure: ICD IMPLANT;  Surgeon: Regan Lemming, MD;  Location: [facility] INVASIVE CV LAB;  Service: Cardiovascular;  Laterality: N/A;   IR ANGIO INTRA EXTRACRAN SEL COM CAROTID INNOMINATE BILAT MOD SED  01/05/2017   IR ANGIO INTRA EXTRACRAN SEL COM CAROTID INNOMINATE BILAT MOD SED  03/19/2019   IR ANGIO INTRA EXTRACRAN SEL COM CAROTID INNOMINATE BILAT MOD SED  06/04/2020   IR ANGIO INTRA EXTRACRAN SEL INTERNAL CAROTID UNI L MOD SED  04/15/2019   IR ANGIO VERTEBRAL SEL VERTEBRAL BILAT MOD SED  01/05/2017   IR ANGIO VERTEBRAL SEL VERTEBRAL BILAT MOD SED  03/19/2019   IR ANGIO VERTEBRAL SEL VERTEBRAL UNI L MOD SED  06/04/2020   IR ANGIOGRAM FOLLOW UP STUDY  04/15/2019   IR RADIOLOGIST EVAL & MGMT  12/30/2016   IR TRANSCATH/EMBOLIZ  04/15/2019   IR US GUIDE VASC ACCESS RIGHT  03/19/2019   IR US GUIDE VASC ACCESS RIGHT  06/04/2020   MALONEY DILATION N/A 09/20/2018   Procedure: Elease Hashimoto  DILATION;  Surgeon: Corbin Ade, MD;  Location: [facility];  Service: Endoscopy;  Laterality: N/A;   MALONEY DILATION N/A 01/04/2023   Procedure: Elease Hashimoto DILATION;  Surgeon: Corbin Ade, MD;  Location: [facility];  Service: Endoscopy;  Laterality: N/A;   RADIOLOGY WITH ANESTHESIA N/A 04/15/2019   Procedure: Sharman Crate;  Surgeon: Julieanne Cotton, MD;  Location: [facility] OR;  Service: Radiology;  Laterality: N/A;   REVERSE SHOULDER ARTHROPLASTY Left 03/21/2023   Procedure: LEFT REVERSE SHOULDER ARTHROPLASTY;  Surgeon: Cammy Copa, MD;  Location: [facility] OR;  Service: Orthopedics;  Laterality: Left;   RIGHT/LEFT HEART CATH AND CORONARY ANGIOGRAPHY N/A 08/19/2019   Procedure: RIGHT/LEFT HEART CATH AND CORONARY ANGIOGRAPHY;  Surgeon: Laurey Morale, MD;  Location: [facility] INVASIVE CV LAB;  Service: Cardiovascular;  Laterality: N/A;   TUBAL LIGATION  1987   VAGINAL HYSTERECTOMY  2009   Patient Active Problem List   Diagnosis  Date Noted   Panic attacks 04/21/2023   Multiple pulmonary nodules determined by computed tomography of lung 04/21/2023   Arthritis of left shoulder region 04/02/2023   Biceps tendonitis on left 04/02/2023   OA (osteoarthritis) of shoulder 03/21/2023   S/P reverse total shoulder arthroplasty, left 03/21/2023   RUQ pain 06/29/2022   Pancreatic cyst 06/29/2022   Peripheral neuropathy 05/04/2022   Restless leg syndrome 05/04/2022   Hypertriglyceridemia 05/04/2022   Bilateral shoulder pain 05/04/2022   Encounter for general adult medical examination with abnormal findings 05/04/2022   H/O total hysterectomy 04/26/2022   Odynophagia 04/20/2022   Abdominal pain, epigastric 04/20/2022   Nausea without vomiting 06/16/2021   Allergy to alpha-gal 06/16/2021   Orthostatic hypotension 06/16/2021   Hypocortisolemia (HCC) 03/02/2021   Adrenal adenoma, left 03/02/2021   AKI (acute kidney injury) (HCC) 01/04/2021   Intractable nausea and vomiting 01/03/2021    Left-sided weakness 10/19/2020   Cough 02/11/2020   Near syncope 12/10/2019   Brain aneurysm 04/15/2019   Dysphagia 02/27/2018   Encounter for screening colonoscopy 02/27/2018   History of Clostridium difficile infection 02/27/2018   Chronic diarrhea 12/20/2017   Chronic combined systolic and diastolic congestive heart failure (HCC) 06/20/2016   Hypokalemia due to excessive gastrointestinal loss of potassium    Acute CHF (congestive heart failure) (HCC) 05/16/2016   Acute on chronic systolic CHF (congestive heart failure) (HCC) 05/16/2016   Acute respiratory failure with hypoxia (HCC)    Thrombocytosis 03/26/2016   Cardiomyopathy, ischemic 03/25/2016   Chronic combined systolic and diastolic heart failure (HCC) 03/25/2016   Anxiety state 03/25/2016   Troponin level elevated 03/25/2016   Coronary artery disease involving native coronary artery of native heart 03/25/2016   Normocytic anemia 03/25/2016   SOB (shortness of breath) 03/24/2016   Lightheadedness 03/17/2016   Hypotension 03/17/2016   Tobacco abuse 03/12/2016   NSTEMI (non-ST elevated myocardial infarction) (HCC) 03/11/2016   Atypical chest pain    Essential hypertension 09/06/2015   Hyperlipidemia 09/06/2015   GERD without esophagitis 09/06/2015   Chest pain 09/06/2015    PCP: Christel Mormon, MD REFERRING PROVIDER: Lenice Pressman, MD  ONSET DATE: 03/21/23  REFERRING DIAG: L Reverse Total Shoulder  THERAPY DIAG:  Acute pain of left shoulder  Stiffness of left shoulder, not elsewhere classified  Other symptoms and signs involving the musculoskeletal system  Rationale for Evaluation and Treatment: Rehabilitation  SUBJECTIVE:   SUBJECTIVE STATEMENT: "I'm having grinding in my shoulder with different movements now" Pt accompanied by: self  PERTINENT HISTORY: Pt reports L shoulder pain for ~7 years with worsening pain. This past year she reports limiting ability to cook, clean, and complete certain parts of  dressing and bathing. On 03/21/23, pt s/p L Reverse Total Shoulder.   PRECAUTIONS: Shoulder  WEIGHT BEARING RESTRICTIONS: Yes >1lb  PAIN:  Are you having pain? Yes: NPRS scale: 4/10 Pain location: Deltoid Pain description: dull ache Aggravating factors: Cold, supine position Relieving factors: medication  FALLS: Has patient fallen in last 6 months? No  PLOF: Independent  PATIENT GOALS: "To be able to fasten my own bra"  NEXT MD VISIT: 06/19/23  OBJECTIVE:   HAND DOMINANCE: Right  ADLs: Overall ADLs: Pt reports inability to clasp her bra, put her hair up, or wash her hair. Due to precautions and pain/ROM limits she is unable to lift and carry any items heavier than her cell phone.   FUNCTIONAL OUTCOME MEASURES: FOTO: 42.21 06/07/23: 55.53/100  UPPER EXTREMITY ROM:  Assessed in supine, er/IR adducted  Passive ROM Left eval Left 06/07/23  Shoulder flexion 99 134  Shoulder abduction 78 122  Shoulder internal rotation 90 90  Shoulder external rotation 24 63  (Blank rows = not tested)    UPPER EXTREMITY MMT:     Assessed in seated, er/IR adducted  MMT Left eval Left 06/07/23  Shoulder flexion  4-/5  Shoulder abduction  4-/5  Shoulder internal rotation  3+/5  Shoulder external rotation  3+/5  (Blank rows = not tested)  SENSATION: Mild numbness and tingling noted intermittently in finger tips and elbow  EDEMA: no swelling noted  OBSERVATIONS: Moderate fascial restrictions    TODAY'S TREATMENT:                                                                                                                              DATE:   06/07/23 -Pulleys: flexion, abduction, x60" -A/ROM: seated, flexion, abduction, protraction, horizontal abduction, er/IR, x15 -x to v arms, x10 -goal post arms, x10 -scapular strengthening: green band, extension, retraction, rows, x12 - limited rows due to pain -Shoulder strengthening: yellow band, flexion, abduction, er, IR,  horizontal abduction, x12  05/25/23 -manual therapy: myofascial release and trigger point applied to biceps, trapezius, deltoid, and scapular region for improving pain and fascial restrictions, as well as ROM -A/ROM: supine-protraction, flexion, abduction, er, horizontal abduction, x15 -Theraball Exercises: basket ball (attempted green theraball max difficulty), flexion, protraction, overhead press, V ups, circles both directions, x12 -UBE: Level 1, 2.5' forwards and backwards, pace: 3.5+  05/18/23 -manual therapy: myofascial release and trigger point applied to biceps, trapezius, deltoid, and scapular region for improving pain and fascial restrictions, as well as ROM -P/ROM: supine-flexion, abduction, horizontal abduction, er, 5 reps -A/ROM: supine-protraction, flexion, abduction, er, horizontal abduction, 12 reps -Proximal shoulder strengthening: supine-paddles, criss cross, circles each direction, 10 reps -A/ROM: standing-protraction, flexion, abduction, er, horizontal abduction, 10 reps -Wall wash: 1' -Proximal shoulder strengthening: standing at doorway, shoulder at 90 degrees flexion, 1'  -UBE: level 1, 2' forward, 2' reverse, pace: 4.5 -Scapular theraband: red-row, extension, retraction, 10 reps   PATIENT EDUCATION: Education details: reviewed HEP Person educated: Patient Education method: Explanation, Demonstration, and Handouts Education comprehension: verbalized understanding and returned demonstration  HOME EXERCISE PROGRAM: 12/2: Table Slides 12/9: Pendulums 12/16: AA/ROM 12/30: A/ROM   GOALS: Goals reviewed with patient? Yes   SHORT TERM GOALS: Target date: 05/08/23  Pt will be provided with and educated on HEP to improve mobility in LUE required for use during ADL completion.   Goal status: IN PROGRESS  2.  Pt will increase LUE P/ROM by 30 degrees to improve ability to use LUE during dressing tasks with minimal compensatory techniques.   Goal status: IN  PROGRESS  3.  Pt will increase LUE strength to 3+/5 to improve ability to reach for items at waist to chest height during bathing and grooming tasks.   Goal status: IN PROGRESS   LONG  TERM GOALS: Target date: 06/09/23  Pt will decrease pain in LUE to 3/10 or less to improve ability to sleep for 2+ consecutive hours without waking due to pain.   Goal status: IN PROGRESS  2.  Pt will decrease LUE fascial restrictions to min amounts or less to improve mobility required for functional reaching tasks.   Goal status: IN PROGRESS  3.  Pt will increase LUE A/ROM by 45 degrees to improve ability to use LUE when reaching overhead or behind back during dressing and bathing tasks.   Goal status: IN PROGRESS  4.  Pt will increase LUE strength to 4+/5 or greater to improve ability to use LUE when lifting or carrying items during meal preparation/housework/yardwork tasks.   Goal status: IN PROGRESS  5.  Pt will return to highest level of function using LUE as non-dominant during functional task completion.   Goal status: IN PROGRESS   ASSESSMENT:  CLINICAL IMPRESSION: This session, pt presenting with increased pain and difficulty with resistance exercises. She reported feeling grinding in her shoulder joint "like before the surgery", with most ROM exercises. When adding resistance band work, she demonstrated poor mobility and increased weakness, requiring less resistance and less range. Pt was unable to finish rows with the theraband due to pain, completed with just active range, however pain continues. Verbal and tactile cuing provided for positioning and technique throughout session.  PERFORMANCE DEFICITS: in functional skills including in functional skills including ADLs, IADLs, coordination, tone, ROM, strength, pain, fascial restrictions, muscle spasms, and UE functional use.   PLAN:  OT FREQUENCY: 2x/week  OT DURATION: 8 weeks  PLANNED INTERVENTIONS: 97168 OT Re-evaluation, 97535  self care/ADL training, 16109 therapeutic exercise, 97530 therapeutic activity, 97140 manual therapy, 97035 ultrasound, 97010 moist heat, 97032 electrical stimulation (manual), passive range of motion, energy conservation, coping strategies training, patient/family education, and DME and/or AE instructions  CONSULTED AND AGREED WITH PLAN OF CARE: Patient  PLAN FOR NEXT SESSION: Manual Therapy, P/ROM, A/ROM, proximal shoulder strengthening, functional reaching, add shoulder stretches   Trish Mage, OTR/L [phone] 06/07/2023, 1:41 PM  [employer] Auth Request  Referring diagnosis code (ICD 10)? B14.782, M19.012 Treatment diagnosis codes (ICD 10)? (if different than referring diagnosis) M25.512, M25.612, R29.898  What was this (referring dx) caused by? [x]  Surgery []  Fall []  Ongoing issue []  Arthritis []  Other: ____________  Laterality: []  Rt [x]  Lt []  Both  Deficits: [x]  Pain [x]  Stiffness [x]  Weakness []  Edema []  Balance Deficits []  Coordination []  Gait Disturbance [x]  ROM []  Other   Functional Tool Score: FOTO - 55/100  CPT codes: See Planned Interventions listed in the Plan section of the Evaluation.

## 2023-06-08 ENCOUNTER — Telehealth (HOSPITAL_COMMUNITY): Payer: Self-pay

## 2023-06-08 NOTE — Telephone Encounter (Signed)
Called to confirm 06/12/23 appt pt was not available

## 2023-06-09 NOTE — Telephone Encounter (Signed)
 Appt confirmed

## 2023-06-12 ENCOUNTER — Ambulatory Visit (HOSPITAL_COMMUNITY): Payer: Medicare PPO | Admitting: Registered Nurse

## 2023-06-12 ENCOUNTER — Encounter (HOSPITAL_COMMUNITY): Payer: Self-pay

## 2023-06-13 ENCOUNTER — Encounter (HOSPITAL_COMMUNITY): Payer: Self-pay | Admitting: Occupational Therapy

## 2023-06-13 ENCOUNTER — Ambulatory Visit (HOSPITAL_COMMUNITY): Payer: Medicare Other | Attending: Orthopedic Surgery | Admitting: Occupational Therapy

## 2023-06-13 DIAGNOSIS — M25612 Stiffness of left shoulder, not elsewhere classified: Secondary | ICD-10-CM | POA: Diagnosis present

## 2023-06-13 DIAGNOSIS — R29898 Other symptoms and signs involving the musculoskeletal system: Secondary | ICD-10-CM | POA: Diagnosis present

## 2023-06-13 DIAGNOSIS — M25512 Pain in left shoulder: Secondary | ICD-10-CM | POA: Diagnosis present

## 2023-06-13 NOTE — Addendum Note (Signed)
Addended by: Haydee Monica on: 06/13/2023 10:34 AM   Modules accepted: Orders

## 2023-06-13 NOTE — Therapy (Signed)
 OUTPATIENT OCCUPATIONAL THERAPY ORTHO TREATMENT NOTE   Patient Name: Erin [name] MRN: [id number] DOB:30-Mar-1961, 63 y.o., female Today's Date: 06/13/2023   END OF SESSION:  OT End of Session - 06/13/23 1131     Visit Number 8    Number of Visits 17    Date for OT Re-Evaluation 07/14/23    Authorization Type [employer]    Authorization Time Period requesting 8 more visits    Progress Note Due on Visit 10    OT Start Time 1103    OT Stop Time 1130   pt requesting to leave early   OT Time Calculation (min) 27 min    Activity Tolerance Patient tolerated treatment well    Behavior During Therapy [facility] for tasks assessed/performed              Past Medical History:  Diagnosis Date   AICD (automatic cardioverter/defibrillator) present    Allergic reaction to alpha-gal    Allergy 03/05/2022   Anemia    Anxiety state 03/25/2016   Arthritis    Basal cell carcinoma of forehead    Brain aneurysm    CHF (congestive heart failure) (HCC)    Coronary artery disease    a. 03/11/16 PCI with DES-->Prox/Mid Cx;  b. 03/14/16 PCI with DES x2-->RCA, EF 30-35%.   Depression 06/09/2010   Encounter for general adult medical examination with abnormal findings 05/04/2022   Essential hypertension    Hx   GERD (gastroesophageal reflux disease)    HFrEF (heart failure with reduced ejection fraction) (HCC)    a. 10/2016 Echo: EF 35-40%, Gr1 DD, mild focal basal septal hypertrophy, basal inflat, mid inflat, basal antlat AK. Mid infept/inf/antlat, apical lateral sev HK. Mod MR. mildly reduced RV fxn. Mild TR.   History of pneumonia    Hyperlipidemia    IBS (irritable bowel syndrome)    Ischemic cardiomyopathy    a. 10/2016 Echo: EF 35-40%, Gr1 DD.   Mitral regurgitation    Neuromuscular disorder (HCC)    NSTEMI (non-ST elevated myocardial infarction) (HCC) 03/10/2016   Pneumonia 03/2016   Shingles    Squamous cell cancer of skin of nose    Thrombocytosis 03/26/2016   Tobacco abuse     Trichimoniasis    Wears dentures    Wears glasses    Past Surgical History:  Procedure Laterality Date   APPENDECTOMY     BICEPT TENODESIS Left 03/21/2023   Procedure: BICEPS TENODESIS;  Surgeon: Addie Cordella Hamilton, MD;  Location[facility] OR;  Service: Orthopedics;  Laterality: Left;   BIOPSY  09/20/2018   Procedure: BIOPSY;  Surgeon: Shaaron Lamar HERO, MD;  Location: [facility];  Service: Endoscopy;;  colon   BIOPSY  01/05/2021   Procedure: BIOPSY;  Surgeon: Teressa Toribio SQUIBB, MD;  Location: [facility] ENDOSCOPY;  Service: Endoscopy;;   BIOPSY  04/27/2022   Procedure: BIOPSY;  Surgeon: Cindie Carlin POUR, DO;  Location: [facility];  Service: Endoscopy;;   CARDIAC CATHETERIZATION N/A 03/11/2016   Procedure: Left Heart Cath and Coronary Angiography;  Surgeon: Alm LELON Clay, MD;  Location: [facility] INVASIVE CV LAB;  Service: Cardiovascular;  Laterality: N/A;   CARDIAC CATHETERIZATION N/A 03/11/2016   Procedure: Coronary Stent Intervention;  Surgeon: Alm LELON Clay, MD;  Location: [facility] INVASIVE CV LAB;  Service: Cardiovascular;  Laterality: N/A;   CARDIAC CATHETERIZATION N/A 03/14/2016   Procedure: Coronary Stent Intervention;  Surgeon: Peter M Jordan, MD;  Location: [facility] INVASIVE CV LAB;  Service: Cardiovascular;  Laterality: N/A;  CEREBRAL ANEURYSM REPAIR  04/2019   stent placed   CHOLECYSTECTOMY OPEN  1984   COLONOSCOPY WITH PROPOFOL  N/A 09/20/2018   Procedure: COLONOSCOPY WITH PROPOFOL ;  Surgeon: Shaaron Lamar HERO, MD;  Location: [facility];  Service: Endoscopy;  Laterality: N/A;  10:30am   CORONARY ANGIOPLASTY WITH STENT PLACEMENT  03/14/2016   ESOPHAGOGASTRODUODENOSCOPY (EGD) WITH PROPOFOL  N/A 09/20/2018   Procedure: ESOPHAGOGASTRODUODENOSCOPY (EGD) WITH PROPOFOL ;  Surgeon: Shaaron Lamar HERO, MD;  Location: [facility];  Service: Endoscopy;  Laterality: N/A;   ESOPHAGOGASTRODUODENOSCOPY (EGD) WITH PROPOFOL  N/A 01/05/2021   Procedure: ESOPHAGOGASTRODUODENOSCOPY (EGD) WITH PROPOFOL ;  Surgeon: Teressa Toribio SQUIBB, MD;  Location: [facility] ENDOSCOPY;  Service: Endoscopy;  Laterality: N/A;   ESOPHAGOGASTRODUODENOSCOPY (EGD) WITH PROPOFOL  N/A 04/27/2022   Procedure: ESOPHAGOGASTRODUODENOSCOPY (EGD) WITH PROPOFOL ;  Surgeon: Cindie Carlin POUR, DO;  Location: [facility];  Service: Endoscopy;  Laterality: N/A;  9:15am, asa 3/4, ASAP   ESOPHAGOGASTRODUODENOSCOPY (EGD) WITH PROPOFOL  N/A 01/04/2023   Procedure: ESOPHAGOGASTRODUODENOSCOPY (EGD) WITH PROPOFOL ;  Surgeon: Shaaron Lamar HERO, MD;  Location: [facility];  Service: Endoscopy;  Laterality: N/A;  130pm, asa 3   FINGER ARTHROPLASTY Left 05/14/2013   Procedure: LEFT THUMB CARPAL METACARPAL ARTHROPLASTY;  Surgeon: Franky JONELLE Curia, MD;  Location: [facility];  Service: Orthopedics;  Laterality: Left;   ICD IMPLANT N/A 04/03/2020   Procedure: ICD IMPLANT;  Surgeon: Inocencio Soyla Lunger, MD;  Location: [facility] INVASIVE CV LAB;  Service: Cardiovascular;  Laterality: N/A;   IR ANGIO INTRA EXTRACRAN SEL COM CAROTID INNOMINATE BILAT MOD SED  01/05/2017   IR ANGIO INTRA EXTRACRAN SEL COM CAROTID INNOMINATE BILAT MOD SED  03/19/2019   IR ANGIO INTRA EXTRACRAN SEL COM CAROTID INNOMINATE BILAT MOD SED  06/04/2020   IR ANGIO INTRA EXTRACRAN SEL INTERNAL CAROTID UNI L MOD SED  04/15/2019   IR ANGIO VERTEBRAL SEL VERTEBRAL BILAT MOD SED  01/05/2017   IR ANGIO VERTEBRAL SEL VERTEBRAL BILAT MOD SED  03/19/2019   IR ANGIO VERTEBRAL SEL VERTEBRAL UNI L MOD SED  06/04/2020   IR ANGIOGRAM FOLLOW UP STUDY  04/15/2019   IR RADIOLOGIST EVAL & MGMT  12/30/2016   IR TRANSCATH/EMBOLIZ  04/15/2019   IR US  GUIDE VASC ACCESS RIGHT  03/19/2019   IR US  GUIDE VASC ACCESS RIGHT  06/04/2020   MALONEY DILATION N/A 09/20/2018   Procedure: AGAPITO DILATION;  Surgeon: Shaaron Lamar HERO, MD;  Location: [facility];  Service: Endoscopy;  Laterality: N/A;   MALONEY DILATION N/A 01/04/2023   Procedure: AGAPITO DILATION;  Surgeon: Shaaron Lamar HERO, MD;  Location: [facility];  Service:  Endoscopy;  Laterality: N/A;   RADIOLOGY WITH ANESTHESIA N/A 04/15/2019   Procedure: BARBARANN;  Surgeon: Dolphus Carrion, MD;  Location: [facility] OR;  Service: Radiology;  Laterality: N/A;   REVERSE SHOULDER ARTHROPLASTY Left 03/21/2023   Procedure: LEFT REVERSE SHOULDER ARTHROPLASTY;  Surgeon: Addie Cordella Hamilton, MD;  Location: [facility] OR;  Service: Orthopedics;  Laterality: Left;   RIGHT/LEFT HEART CATH AND CORONARY ANGIOGRAPHY N/A 08/19/2019   Procedure: RIGHT/LEFT HEART CATH AND CORONARY ANGIOGRAPHY;  Surgeon: Rolan Ezra RAMAN, MD;  Location: [facility] INVASIVE CV LAB;  Service: Cardiovascular;  Laterality: N/A;   TUBAL LIGATION  1987   VAGINAL HYSTERECTOMY  2009   Patient Active Problem List   Diagnosis Date Noted   Panic attacks 04/21/2023   Multiple pulmonary nodules determined by computed tomography of lung 04/21/2023   Arthritis of left shoulder region 04/02/2023   Biceps tendonitis on left 04/02/2023  OA (osteoarthritis) of shoulder 03/21/2023   S/P reverse total shoulder arthroplasty, left 03/21/2023   RUQ pain 06/29/2022   Pancreatic cyst 06/29/2022   Peripheral neuropathy 05/04/2022   Restless leg syndrome 05/04/2022   Hypertriglyceridemia 05/04/2022   Bilateral shoulder pain 05/04/2022   Encounter for general adult medical examination with abnormal findings 05/04/2022   H/O total hysterectomy 04/26/2022   Odynophagia 04/20/2022   Abdominal pain, epigastric 04/20/2022   Nausea without vomiting 06/16/2021   Allergy to alpha-gal 06/16/2021   Orthostatic hypotension 06/16/2021   Hypocortisolemia (HCC) 03/02/2021   Adrenal adenoma, left 03/02/2021   AKI (acute kidney injury) (HCC) 01/04/2021   Intractable nausea and vomiting 01/03/2021   Left-sided weakness 10/19/2020   Cough 02/11/2020   Near syncope 12/10/2019   Brain aneurysm 04/15/2019   Dysphagia 02/27/2018   Encounter for screening colonoscopy 02/27/2018   History of Clostridium difficile infection 02/27/2018   Chronic  diarrhea 12/20/2017   Chronic combined systolic and diastolic congestive heart failure (HCC) 06/20/2016   Hypokalemia due to excessive gastrointestinal loss of potassium    Acute CHF (congestive heart failure) (HCC) 05/16/2016   Acute on chronic systolic CHF (congestive heart failure) (HCC) 05/16/2016   Acute respiratory failure with hypoxia (HCC)    Thrombocytosis 03/26/2016   Cardiomyopathy, ischemic 03/25/2016   Chronic combined systolic and diastolic heart failure (HCC) 03/25/2016   Anxiety state 03/25/2016   Troponin level elevated 03/25/2016   Coronary artery disease involving native coronary artery of native heart 03/25/2016   Normocytic anemia 03/25/2016   SOB (shortness of breath) 03/24/2016   Lightheadedness 03/17/2016   Hypotension 03/17/2016   Tobacco abuse 03/12/2016   NSTEMI (non-ST elevated myocardial infarction) (HCC) 03/11/2016   Atypical chest pain    Essential hypertension 09/06/2015   Hyperlipidemia 09/06/2015   GERD without esophagitis 09/06/2015   Chest pain 09/06/2015    PCP: Melvenia Pastor, MD REFERRING PROVIDER: Addie Cordella RAMAN, MD  ONSET DATE: 03/21/23  REFERRING DIAG: L Reverse Total Shoulder  THERAPY DIAG:  Acute pain of left shoulder  Stiffness of left shoulder, not elsewhere classified  Other symptoms and signs involving the musculoskeletal system  Rationale for Evaluation and Treatment: Rehabilitation  SUBJECTIVE:   SUBJECTIVE STATEMENT: S: I think I may have slept on it last night.   PERTINENT HISTORY: Pt reports L shoulder pain for ~7 years with worsening pain. This past year she reports limiting ability to cook, clean, and complete certain parts of dressing and bathing. On 03/21/23, pt s/p L Reverse Total Shoulder.   PRECAUTIONS: Shoulder  WEIGHT BEARING RESTRICTIONS: Yes >1lb  PAIN:  Are you having pain? Yes: NPRS scale: 5/10 Pain location: posterior deltoid Pain description: sore Aggravating factors: Cold, supine  position Relieving factors: medication  FALLS: Has patient fallen in last 6 months? No  PLOF: Independent  PATIENT GOALS: To be able to fasten my own bra  NEXT MD VISIT: 06/19/23  OBJECTIVE:   HAND DOMINANCE: Right  ADLs: Overall ADLs: Pt reports inability to clasp her bra, put her hair up, or wash her hair. Due to precautions and pain/ROM limits she is unable to lift and carry any items heavier than her cell phone.   FUNCTIONAL OUTCOME MEASURES: FOTO: 42.21 06/07/23: 55.53/100  UPPER EXTREMITY ROM:       Assessed in supine, er/IR adducted  Passive ROM Left eval Left 06/07/23  Shoulder flexion 99 134  Shoulder abduction 78 122  Shoulder internal rotation 90 90  Shoulder external rotation 24 63  (Blank rows =  not tested)    UPPER EXTREMITY MMT:     Assessed in seated, er/IR adducted  MMT Left eval Left 06/07/23  Shoulder flexion  4-/5  Shoulder abduction  4-/5  Shoulder internal rotation  3+/5  Shoulder external rotation  3+/5  (Blank rows = not tested)  SENSATION: Mild numbness and tingling noted intermittently in finger tips and elbow  EDEMA: no swelling noted  OBSERVATIONS: Moderate fascial restrictions    TODAY'S TREATMENT:                                                                                                                              DATE:  06/13/23 -manual therapy: myofascial release and trigger point applied to biceps, trapezius, deltoid, and scapular region for improving pain and fascial restrictions, as well as ROM -P/ROM: supine-flexion, abduction, horizontal abduction, er, 5 reps -A/ROM: supine-protraction, flexion, er, horizontal abduction, 12 reps -Proximal shoulder strengthening: supine-paddles, criss cross, circles each direction, 10 reps -Shoulder stretches: flexion, IR behind back with horizontal towel, corner stretch, cross chest stretch, er at wall, 2x20 holds -Scapular theraband: extension, 10 reps  06/07/23 -Pulleys:  flexion, abduction, x60 -A/ROM: seated, flexion, abduction, protraction, horizontal abduction, er/IR, x15 -x to v arms, x10 -goal post arms, x10 -scapular strengthening: green band, extension, retraction, rows, x12 - limited rows due to pain -Shoulder strengthening: yellow band, flexion, abduction, er, IR, horizontal abduction, x12  05/25/23 -manual therapy: myofascial release and trigger point applied to biceps, trapezius, deltoid, and scapular region for improving pain and fascial restrictions, as well as ROM -A/ROM: supine-protraction, flexion, abduction, er, horizontal abduction, x15 -Theraball Exercises: basket ball (attempted green theraball max difficulty), flexion, protraction, overhead press, V ups, circles both directions, x12 -UBE: Level 1, 2.5' forwards and backwards, pace: 3.5+  05/18/23 -manual therapy: myofascial release and trigger point applied to biceps, trapezius, deltoid, and scapular region for improving pain and fascial restrictions, as well as ROM -P/ROM: supine-flexion, abduction, horizontal abduction, er, 5 reps -A/ROM: supine-protraction, flexion, abduction, er, horizontal abduction, 12 reps -Proximal shoulder strengthening: supine-paddles, criss cross, circles each direction, 10 reps -A/ROM: standing-protraction, flexion, abduction, er, horizontal abduction, 10 reps -Wall wash: 1' -Proximal shoulder strengthening: standing at doorway, shoulder at 90 degrees flexion, 1'  -UBE: level 1, 2' forward, 2' reverse, pace: 4.5 -Scapular theraband: red-row, extension, retraction, 10 reps   PATIENT EDUCATION: Education details: shoulder stretches Person educated: Patient Education method: Explanation, Demonstration, and Handouts Education comprehension: verbalized understanding and returned demonstration  HOME EXERCISE PROGRAM: 12/2: Table Slides 12/9: Pendulums 12/16: AA/ROM 12/30: A/ROM  2/4: shoulder stretches  GOALS: Goals reviewed with patient? Yes   SHORT  TERM GOALS: Target date: 05/08/23  Pt will be provided with and educated on HEP to improve mobility in LUE required for use during ADL completion.   Goal status: IN PROGRESS  2.  Pt will increase LUE P/ROM by 30 degrees to improve ability to use LUE during dressing tasks with minimal compensatory techniques.  Goal status: IN PROGRESS  3.  Pt will increase LUE strength to 3+/5 to improve ability to reach for items at waist to chest height during bathing and grooming tasks.   Goal status: IN PROGRESS   LONG TERM GOALS: Target date: 06/09/23  Pt will decrease pain in LUE to 3/10 or less to improve ability to sleep for 2+ consecutive hours without waking due to pain.   Goal status: IN PROGRESS  2.  Pt will decrease LUE fascial restrictions to min amounts or less to improve mobility required for functional reaching tasks.   Goal status: IN PROGRESS  3.  Pt will increase LUE A/ROM by 45 degrees to improve ability to use LUE when reaching overhead or behind back during dressing and bathing tasks.   Goal status: IN PROGRESS  4.  Pt will increase LUE strength to 4+/5 or greater to improve ability to use LUE when lifting or carrying items during meal preparation/housework/yardwork tasks.   Goal status: IN PROGRESS  5.  Pt will return to highest level of function using LUE as non-dominant during functional task completion.   Goal status: IN PROGRESS   ASSESSMENT:  CLINICAL IMPRESSION: Pt reports stomach pain and issues on arrival, chronic in nature, ended up leaving early due to pain. Completed manual techniques for soft tissue mobilization, mod trigger point at upper trapezius, limited tolerance to trigger point release. Pt continued with A/ROM, added shoulder stretches today for improved ROM within pain tolerance. Pt with good activity tolerance, modified stretch and resistance as needed, achieving ROM to 75% with flexion. Verbal cuing for form and technique.   PERFORMANCE DEFICITS:  in functional skills including in functional skills including ADLs, IADLs, coordination, tone, ROM, strength, pain, fascial restrictions, muscle spasms, and UE functional use.   PLAN:  OT FREQUENCY: 2x/week  OT DURATION: 8 weeks  PLANNED INTERVENTIONS: 97168 OT Re-evaluation, 97535 self care/ADL training, 02889 therapeutic exercise, 97530 therapeutic activity, 97140 manual therapy, 97035 ultrasound, 97010 moist heat, 97032 electrical stimulation (manual), passive range of motion, energy conservation, coping strategies training, patient/family education, and DME and/or AE instructions  CONSULTED AND AGREED WITH PLAN OF CARE: Patient  PLAN FOR NEXT SESSION: Manual Therapy, P/ROM, A/ROM, proximal shoulder strengthening, functional reaching, shoulder stretches, follow up on HEP   [employer], OTR/L  [phone] 06/13/2023, 11:32 AM

## 2023-06-13 NOTE — Patient Instructions (Signed)

## 2023-06-14 ENCOUNTER — Encounter: Payer: Self-pay | Admitting: Emergency Medicine

## 2023-06-14 ENCOUNTER — Telehealth: Payer: Self-pay | Admitting: Emergency Medicine

## 2023-06-14 ENCOUNTER — Ambulatory Visit (INDEPENDENT_AMBULATORY_CARE_PROVIDER_SITE_OTHER): Payer: Self-pay | Admitting: Emergency Medicine

## 2023-06-14 VITALS — BP 122/68 | HR 80

## 2023-06-14 DIAGNOSIS — J449 Chronic obstructive pulmonary disease, unspecified: Secondary | ICD-10-CM | POA: Diagnosis not present

## 2023-06-14 DIAGNOSIS — I2510 Atherosclerotic heart disease of native coronary artery without angina pectoris: Secondary | ICD-10-CM | POA: Diagnosis not present

## 2023-06-14 DIAGNOSIS — R911 Solitary pulmonary nodule: Secondary | ICD-10-CM

## 2023-06-14 DIAGNOSIS — Z720 Tobacco use: Secondary | ICD-10-CM | POA: Diagnosis not present

## 2023-06-14 NOTE — Telephone Encounter (Signed)
 Thanks Dr Rolan --  She has groundglass upper lobe nodule suspicious for malignancy.  I have tentatively scheduled her for navigational bronchoscopy but this is under general anesthesia.  Want to make sure that we do not have any hesitation about general anesthesia.  We can arrange for her to see you if necessary

## 2023-06-14 NOTE — Telephone Encounter (Signed)
 Hi Dr. Mclean,  He have Erin [name] scheduled for navigational bronchoscopy on 07/03/23 with Dr. Shelah. He have advised her to hold her ASA 2 days prior and plavix  5 days prior to procedure. Can you please provide Dr. Shelah with risk assessment for procedure from cardiac standpoint?  Thank you!

## 2023-06-14 NOTE — Progress Notes (Signed)
 Subjective:    Patient ID: Erin  JINNY [name], female    DOB: Jun 11, 1960, 63 y.o.   MRN: [id number]  HPI Discussed the use of AI scribe software for clinical note transcription with the patient, who gave verbal consent to proceed.  History of Present Illness   Erin  ALIKA [name] is a 63 year old female with coronary artery disease, cardiomyopathy, hyperlipidemia, and hypertension who presents for evaluation of an abnormal CT scan of the chest. She was referred for an abnormal CT scan of the chest.  A left upper lobe ground glass nodule was first identified on May 10, 2019, and remained stable on serial imaging, including in October 2024. However, a CT-PA on April 15, 2023, for dyspnea evaluation showed a more cystic component. A dedicated CT chest without contrast on April 28, 2023, confirmed the nodule measuring 1.3 by 1.4 centimeters with an area of lucency inferiorly, suggesting cystic change versus bronchiectasis. Follow-up imaging was recommended at six months.  She experiences shortness of breath, describing it as 'sometimes I feel like I just can't catch my breath,' occurring at any time, even when lying in bed. She attributes this to her congestive heart failure and speculates that fluid levels might contribute to her symptoms. Pulmonary function testing on June 08, 2016, showed moderately severe obstruction without bronchodilator response, with FEV1 at 77% predicted. Lung volumes were normal, but diffusion capacity was decreased and did not fully correct when adjusted for alveolar volume.  She is an active smoker with a history of 60 pack years but has reduced smoking to about two cigarettes a week with Chantix . She has a history of skin cancer, which was removed, and her father had COPD. No family history of lung cancer and she has not been diagnosed with COPD herself.  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      Results   RADIOLOGY CT chest without contrast: Left upper lobe ground glass nodule 1.3 x 1.4 cm with an area of lucency inferiorly representing cystic change (04/28/2023)  DIAGNOSTIC Pulmonary function testing: Moderately severe obstruction without bronchodilator response. FEV1 77% predicted. Normal lung volumes. Decreased diffusion capacity not fully corrected when adjusted for alveolar volume (06/08/2016)      Review of Systems As per HPI  Past Medical History:  Diagnosis Date   AICD (automatic cardioverter/defibrillator) present    Allergic reaction to alpha-gal    Allergy 03/05/2022   Anemia    Anxiety state 03/25/2016   Arthritis    Basal cell carcinoma of forehead    Brain aneurysm    CHF (congestive heart failure) (HCC)    Coronary artery disease    a. 03/11/16 PCI with DES-->Prox/Mid Cx;  b. 03/14/16 PCI with DES x2-->RCA, EF 30-35%.   Depression 06/09/2010   Encounter for general adult medical examination with abnormal findings 05/04/2022   Essential hypertension    Hx   GERD (gastroesophageal reflux disease)    HFrEF (heart failure with reduced ejection fraction) (HCC)    a. 10/2016 Echo: EF 35-40%, Gr1 DD, mild focal basal septal hypertrophy, basal inflat, mid inflat, basal antlat AK. Mid infept/inf/antlat, apical lateral sev HK. Mod MR. mildly reduced RV fxn. Mild TR.   History of pneumonia    Hyperlipidemia    IBS (irritable bowel syndrome)    Ischemic cardiomyopathy    a. 10/2016 Echo: EF 35-40%, Gr1 DD.   Mitral  regurgitation    Neuromuscular disorder (HCC)    NSTEMI (non-ST elevated myocardial infarction) (HCC) 03/10/2016   Pneumonia 03/2016   Shingles    Squamous cell cancer of skin of nose    Thrombocytosis 03/26/2016   Tobacco abuse    Trichimoniasis    Wears dentures    Wears glasses      Family History  Problem Relation Age of Onset   Stroke Mother    Hypertension Mother    Diabetes Mother     Heart attack Mother    Heart attack Father    Diabetes Father    Hypertension Father    CAD Father    Colon polyps Father 24       pre-cancerous    Stroke Father    Dementia Father    Hyperlipidemia Father    Arthritis Father    COPD Father        smoked and worked archivist mills   Heart disease Father    Breast cancer Maternal Grandmother    Diabetes Maternal Grandmother    Cancer Maternal Grandfather        Tongue and esophageal   Cancer Paternal Grandmother    Anxiety disorder Daughter    Depression Daughter    Anxiety disorder Daughter    Heart failure Other    Colon cancer Neg Hx      Social History   Socioeconomic History   Marital status: Married    Spouse name: Not on file   Number of children: Not on file   Years of education: Not on file   Highest education level: Associate degree: occupational, scientist, product/process development, or vocational program  Occupational History   Occupation: CNA  Tobacco Use   Smoking status: Some Days    Current packs/day: 1.50    Average packs/day: 1.5 packs/day for 40.0 years (60.0 ttl pk-yrs)    Types: Cigarettes   Smokeless tobacco: Never   Tobacco comments:    Smokes off and on 06/14/23 Sonny Lecher, CMA   Vaping Use   Vaping status: Never Used  Substance and Sexual Activity   Alcohol use: Not Currently    Comment: occasionally   Drug use: Yes    Types: Marijuana   Sexual activity: Not Currently    Birth control/protection: Surgical    Comment: hyst  Other Topics Concern   Not on file  Social History Narrative   Lives with husband in [place] in a one story home with a basement.  Has 4 children.  Works as a LAWYER.  Education: CNA school.    Social Drivers of Corporate Investment Banker Strain: Low Risk  (04/20/2023)   Overall Financial Resource Strain (CARDIA)    Difficulty of Paying Living Expenses: Not hard at all  Food Insecurity: No Food Insecurity (04/20/2023)   Hunger Vital Sign    Worried About Running Out of Food in the  Last Year: Never true    Ran Out of Food in the Last Year: Never true  Transportation Needs: No Transportation Needs (04/20/2023)   PRAPARE - Administrator, Civil Service (Medical): No    Lack of Transportation (Non-Medical): No  Physical Activity: Insufficiently Active (04/20/2023)   Exercise Vital Sign    Days of Exercise per Week: 3 days    Minutes of Exercise per Session: 30 min  Stress: Stress Concern Present (04/20/2023)   [employer] of Occupational Health - Occupational Stress Questionnaire    Feeling of Stress : Very much  Social Connections: Socially Isolated (04/20/2023)   Social Connection and Isolation Panel [NHANES]    Frequency of Communication with Friends and Family: Once a week    Frequency of Social Gatherings with Friends and Family: Once a week    Attends Religious Services: Never    Database Administrator or Organizations: No    Attends Engineer, Structural: Not on file    Marital Status: Married  Catering Manager Violence: Not At Risk (03/21/2023)   Humiliation, Afraid, Rape, and Kick questionnaire    Fear of Current or Ex-Partner: No    Emotionally Abused: No    Physically Abused: No    Sexually Abused: No     Allergies  Allergen Reactions   Alpha-Gal Shortness Of Breath, Nausea And Vomiting and Dermatitis   Other Shortness Of Breath, Diarrhea, Nausea And Vomiting and Nausea Only    All- red meats  Medications in Capsule form    Tape Other (See Comments)    PEELS SKIN OFF  (PAPER TAPE IS FINE)     Outpatient Medications Prior to Visit  Medication Sig Dispense Refill   aspirin  EC 81 MG tablet Take 81 mg by mouth every evening.      atorvastatin  (LIPITOR ) 80 MG tablet Take 1 tablet (80 mg total) by mouth daily. 90 tablet 3   clopidogrel  (PLAVIX ) 75 MG tablet Take 1 tablet by mouth once daily 30 tablet 11   dapagliflozin  propanediol (FARXIGA ) 10 MG TABS tablet Take 1 tablet (10 mg total) by mouth daily before breakfast. 30  tablet 11   EPINEPHrine  (EPIPEN  2-PAK) 0.3 mg/0.3 mL IJ SOAJ injection Inject 0.3 mg into the muscle as needed for anaphylaxis. 2 each 2   Evolocumab  (REPATHA  SURECLICK) 140 MG/ML SOAJ Inject 140 mg into the skin every 14 (fourteen) days. 6 mL 3   fenofibrate  (TRICOR ) 145 MG tablet Take 1 tablet (145 mg total) by mouth daily. 90 tablet 3   furosemide  (LASIX ) 20 MG tablet TAKE 40MG  (2) TABLETS IN THE MORNING AND 20MG  (1) TABLET IN THE EVENING 90 tablet 3   gabapentin  (NEURONTIN ) 300 MG capsule Take 1 capsule by mouth at bedtime 30 capsule 0   hydrOXYzine  (VISTARIL ) 25 MG capsule Take 1 capsule (25 mg total) by mouth every 8 (eight) hours as needed. 30 capsule 1   linaclotide  (LINZESS ) 145 MCG CAPS capsule Take 1 capsule (145 mcg total) by mouth daily before breakfast. 30 capsule 11   loperamide  (IMODIUM  A-D) 2 MG tablet Take 2 mg by mouth 4 (four) times daily as needed for diarrhea or loose stools.     losartan  (COZAAR ) 25 MG tablet Take 0.5 tablets (12.5 mg total) by mouth at bedtime.     metoprolol  succinate (TOPROL  XL) 25 MG 24 hr tablet Take 1 tablet (25 mg total) by mouth daily. (Patient taking differently: Take 12.5 mg by mouth daily.) 90 tablet 3   Multiple Vitamins-Minerals (MULTIVITAMIN WITH MINERALS) tablet Take 1 tablet by mouth daily.     nitroGLYCERIN  (NITROSTAT ) 0.4 MG SL tablet Place 1 tablet (0.4 mg total) under the tongue every 5 (five) minutes x 3 doses as needed for chest pain (if no relief after 2nd dose, proceed to the ED for an evaluation or call 911). 25 tablet 2   ondansetron  (ZOFRAN ) 4 MG tablet Take 1 tablet (4 mg total) by mouth every 6 (six) hours as needed for nausea or vomiting. 60 tablet 5   pantoprazole  (PROTONIX ) 40 MG tablet TAKE 1 TABLET BY  MOUTH TWICE DAILY BEFORE A MEAL 60 tablet 0   potassium chloride  SA (KLOR-CON  M) 20 MEQ tablet Take 2 tablets (40 mEq total) by mouth 2 (two) times daily. 120 tablet 6   rOPINIRole  (REQUIP ) 0.5 MG tablet Take 0.5 mg by mouth at  bedtime.   0   scopolamine  (TRANSDERM-SCOP) 1 MG/3DAYS Place 1 patch (1.5 mg total) onto the skin every 3 (three) days. 10 patch 5   sertraline  (ZOLOFT ) 50 MG tablet Take 1 tablet (50 mg total) by mouth daily. 30 tablet 3   spironolactone  (ALDACTONE ) 25 MG tablet TAKE 1 TABLET BY MOUTH AT BEDTIME 30 tablet 0   sucralfate  (CARAFATE ) 1 GM/10ML suspension Take 10 mLs (1 g total) by mouth 4 (four) times daily. 420 mL 1   traZODone  (DESYREL ) 50 MG tablet Take 1 tablet (50 mg total) by mouth at bedtime. 90 tablet 1   varenicline  (CHANTIX ) 1 MG tablet Take 1 tablet (1 mg total) by mouth 2 (two) times daily. 60 tablet 3   Vitamin D , Ergocalciferol , (DRISDOL) 1.25 MG (50000 UNIT) CAPS capsule Take 50,000 Units by mouth every 7 (seven) days.     cyproheptadine  (PERIACTIN ) 4 MG tablet Take 8 mg by mouth 2 (two) times daily.     No facility-administered medications prior to visit.        Objective:   Physical Exam  Vitals:   06/14/23 1409  BP: 122/68  Pulse: 80  SpO2: 99%   Gen: Pleasant, well-nourished, in no distress,  normal affect  ENT: No lesions,  mouth clear,  oropharynx clear, no postnasal drip  Neck: No JVD, no stridor  Lungs: No use of accessory muscles, no crackles or wheezing on normal respiration, no wheeze on forced expiration  Cardiovascular: RRR, heart sounds normal, no murmur or gallops, no peripheral edema  Musculoskeletal: No deformities, no cyanosis or clubbing  Neuro: alert, awake, non focal  Skin: Warm, no lesions or rash      Assessment & Plan:  Assessment and Plan    Left Upper Lobe Ground Glass Nodule Stable since 05/10/2019 with recent cystic change noted on 04/15/2023 and confirmed on 04/28/2023. Differential includes cystic change vs bronchiectasis. Discussed the risk/benefit of bronchoscopy vs continued surveillance. Patient concerned and prefers definitive diagnosis. -Schedule bronchoscopy for biopsy. -Stop Aspirin  2 days prior and Plavix  5 days prior to  procedure. -Confirm plan with cardiologist.  Chronic Obstructive Pulmonary Disease Moderately severe obstruction without bronchodilator response noted on PFTs from 06/08/2016. FEV1 77% predicted. Decreased diffusion capacity. Patient reports occasional dyspnea but no recent exacerbations requiring prednisone  or antibiotics. -Continue current management.  Coronary Artery Disease/Cardiomyopathy Managed with AICD in place and on Aspirin  and Plavix . -Continue current management.  Tobacco Use Active smoker with significant reduction in use (from 1.5 packs/day to 2 cigarettes/week) with the help of Chantix . -Encourage continued smoking cessation efforts.        Lamar Chris, MD, PhD 06/14/2023, 2:13 PM [facility] [phone] or if no answer before 7:00PM call [phone] For any issues after 7:00PM please call eLink [phone]

## 2023-06-14 NOTE — H&P (View-Only) (Signed)
 Subjective:    Patient ID: Erin  JINNY [name], female    DOB: 09/10/1960, 63 y.o.   MRN: [id number]  HPI Discussed the use of AI scribe software for clinical note transcription with the patient, who gave verbal consent to proceed.  History of Present Illness   Erin  KESHAWNA [name] is a 63 year old female with coronary artery disease, cardiomyopathy, hyperlipidemia, and hypertension who presents for evaluation of an abnormal CT scan of the chest. She was referred for an abnormal CT scan of the chest.  A left upper lobe ground glass nodule was first identified on May 10, 2019, and remained stable on serial imaging, including in October 2024. However, a CT-PA on April 15, 2023, for dyspnea evaluation showed a more cystic component. A dedicated CT chest without contrast on April 28, 2023, confirmed the nodule measuring 1.3 by 1.4 centimeters with an area of lucency inferiorly, suggesting cystic change versus bronchiectasis. Follow-up imaging was recommended at six months.  She experiences shortness of breath, describing it as 'sometimes I feel like I just can't catch my breath,' occurring at any time, even when lying in bed. She attributes this to her congestive heart failure and speculates that fluid levels might contribute to her symptoms. Pulmonary function testing on June 08, 2016, showed moderately severe obstruction without bronchodilator response, with FEV1 at 77% predicted. Lung volumes were normal, but diffusion capacity was decreased and did not fully correct when adjusted for alveolar volume.  She is an active smoker with a history of 60 pack years but has reduced smoking to about two cigarettes a week with Chantix . She has a history of skin cancer, which was removed, and her father had COPD. No family history of lung cancer and she has not been diagnosed with COPD herself.  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      Results   RADIOLOGY CT chest without contrast: Left upper lobe ground glass nodule 1.3 x 1.4 cm with an area of lucency inferiorly representing cystic change (04/28/2023)  DIAGNOSTIC Pulmonary function testing: Moderately severe obstruction without bronchodilator response. FEV1 77% predicted. Normal lung volumes. Decreased diffusion capacity not fully corrected when adjusted for alveolar volume (06/08/2016)      Review of Systems As per HPI  Past Medical History:  Diagnosis Date   AICD (automatic cardioverter/defibrillator) present    Allergic reaction to alpha-gal    Allergy 03/05/2022   Anemia    Anxiety state 03/25/2016   Arthritis    Basal cell carcinoma of forehead    Brain aneurysm    CHF (congestive heart failure) (HCC)    Coronary artery disease    a. 03/11/16 PCI with DES-->Prox/Mid Cx;  b. 03/14/16 PCI with DES x2-->RCA, EF 30-35%.   Depression 06/09/2010   Encounter for general adult medical examination with abnormal findings 05/04/2022   Essential hypertension    Hx   GERD (gastroesophageal reflux disease)    HFrEF (heart failure with reduced ejection fraction) (HCC)    a. 10/2016 Echo: EF 35-40%, Gr1 DD, mild focal basal septal hypertrophy, basal inflat, mid inflat, basal antlat AK. Mid infept/inf/antlat, apical lateral sev HK. Mod MR. mildly reduced RV fxn. Mild TR.   History of pneumonia    Hyperlipidemia    IBS (irritable bowel syndrome)    Ischemic cardiomyopathy    a. 10/2016 Echo: EF 35-40%, Gr1 DD.   Mitral  regurgitation    Neuromuscular disorder (HCC)    NSTEMI (non-ST elevated myocardial infarction) (HCC) 03/10/2016   Pneumonia 03/2016   Shingles    Squamous cell cancer of skin of nose    Thrombocytosis 03/26/2016   Tobacco abuse    Trichimoniasis    Wears dentures    Wears glasses      Family History  Problem Relation Age of Onset   Stroke Mother    Hypertension Mother    Diabetes Mother     Heart attack Mother    Heart attack Father    Diabetes Father    Hypertension Father    CAD Father    Colon polyps Father 24       pre-cancerous    Stroke Father    Dementia Father    Hyperlipidemia Father    Arthritis Father    COPD Father        smoked and worked archivist mills   Heart disease Father    Breast cancer Maternal Grandmother    Diabetes Maternal Grandmother    Cancer Maternal Grandfather        Tongue and esophageal   Cancer Paternal Grandmother    Anxiety disorder Daughter    Depression Daughter    Anxiety disorder Daughter    Heart failure Other    Colon cancer Neg Hx      Social History   Socioeconomic History   Marital status: Married    Spouse name: Not on file   Number of children: Not on file   Years of education: Not on file   Highest education level: Associate degree: occupational, scientist, product/process development, or vocational program  Occupational History   Occupation: CNA  Tobacco Use   Smoking status: Some Days    Current packs/day: 1.50    Average packs/day: 1.5 packs/day for 40.0 years (60.0 ttl pk-yrs)    Types: Cigarettes   Smokeless tobacco: Never   Tobacco comments:    Smokes off and on 06/14/23 Sonny Lecher, CMA   Vaping Use   Vaping status: Never Used  Substance and Sexual Activity   Alcohol use: Not Currently    Comment: occasionally   Drug use: Yes    Types: Marijuana   Sexual activity: Not Currently    Birth control/protection: Surgical    Comment: hyst  Other Topics Concern   Not on file  Social History Narrative   Lives with husband in [place] in a one story home with a basement.  Has 4 children.  Works as a LAWYER.  Education: CNA school.    Social Drivers of Corporate Investment Banker Strain: Low Risk  (04/20/2023)   Overall Financial Resource Strain (CARDIA)    Difficulty of Paying Living Expenses: Not hard at all  Food Insecurity: No Food Insecurity (04/20/2023)   Hunger Vital Sign    Worried About Running Out of Food in the  Last Year: Never true    Ran Out of Food in the Last Year: Never true  Transportation Needs: No Transportation Needs (04/20/2023)   PRAPARE - Administrator, Civil Service (Medical): No    Lack of Transportation (Non-Medical): No  Physical Activity: Insufficiently Active (04/20/2023)   Exercise Vital Sign    Days of Exercise per Week: 3 days    Minutes of Exercise per Session: 30 min  Stress: Stress Concern Present (04/20/2023)   [employer] of Occupational Health - Occupational Stress Questionnaire    Feeling of Stress : Very much  Social Connections: Socially Isolated (04/20/2023)   Social Connection and Isolation Panel [NHANES]    Frequency of Communication with Friends and Family: Once a week    Frequency of Social Gatherings with Friends and Family: Once a week    Attends Religious Services: Never    Database Administrator or Organizations: No    Attends Engineer, Structural: Not on file    Marital Status: Married  Catering Manager Violence: Not At Risk (03/21/2023)   Humiliation, Afraid, Rape, and Kick questionnaire    Fear of Current or Ex-Partner: No    Emotionally Abused: No    Physically Abused: No    Sexually Abused: No     Allergies  Allergen Reactions   Alpha-Gal Shortness Of Breath, Nausea And Vomiting and Dermatitis   Other Shortness Of Breath, Diarrhea, Nausea And Vomiting and Nausea Only    All- red meats  Medications in Capsule form    Tape Other (See Comments)    PEELS SKIN OFF  (PAPER TAPE IS FINE)     Outpatient Medications Prior to Visit  Medication Sig Dispense Refill   aspirin  EC 81 MG tablet Take 81 mg by mouth every evening.      atorvastatin  (LIPITOR ) 80 MG tablet Take 1 tablet (80 mg total) by mouth daily. 90 tablet 3   clopidogrel  (PLAVIX ) 75 MG tablet Take 1 tablet by mouth once daily 30 tablet 11   dapagliflozin  propanediol (FARXIGA ) 10 MG TABS tablet Take 1 tablet (10 mg total) by mouth daily before breakfast. 30  tablet 11   EPINEPHrine  (EPIPEN  2-PAK) 0.3 mg/0.3 mL IJ SOAJ injection Inject 0.3 mg into the muscle as needed for anaphylaxis. 2 each 2   Evolocumab  (REPATHA  SURECLICK) 140 MG/ML SOAJ Inject 140 mg into the skin every 14 (fourteen) days. 6 mL 3   fenofibrate  (TRICOR ) 145 MG tablet Take 1 tablet (145 mg total) by mouth daily. 90 tablet 3   furosemide  (LASIX ) 20 MG tablet TAKE 40MG  (2) TABLETS IN THE MORNING AND 20MG  (1) TABLET IN THE EVENING 90 tablet 3   gabapentin  (NEURONTIN ) 300 MG capsule Take 1 capsule by mouth at bedtime 30 capsule 0   hydrOXYzine  (VISTARIL ) 25 MG capsule Take 1 capsule (25 mg total) by mouth every 8 (eight) hours as needed. 30 capsule 1   linaclotide  (LINZESS ) 145 MCG CAPS capsule Take 1 capsule (145 mcg total) by mouth daily before breakfast. 30 capsule 11   loperamide  (IMODIUM  A-D) 2 MG tablet Take 2 mg by mouth 4 (four) times daily as needed for diarrhea or loose stools.     losartan  (COZAAR ) 25 MG tablet Take 0.5 tablets (12.5 mg total) by mouth at bedtime.     metoprolol  succinate (TOPROL  XL) 25 MG 24 hr tablet Take 1 tablet (25 mg total) by mouth daily. (Patient taking differently: Take 12.5 mg by mouth daily.) 90 tablet 3   Multiple Vitamins-Minerals (MULTIVITAMIN WITH MINERALS) tablet Take 1 tablet by mouth daily.     nitroGLYCERIN  (NITROSTAT ) 0.4 MG SL tablet Place 1 tablet (0.4 mg total) under the tongue every 5 (five) minutes x 3 doses as needed for chest pain (if no relief after 2nd dose, proceed to the ED for an evaluation or call 911). 25 tablet 2   ondansetron  (ZOFRAN ) 4 MG tablet Take 1 tablet (4 mg total) by mouth every 6 (six) hours as needed for nausea or vomiting. 60 tablet 5   pantoprazole  (PROTONIX ) 40 MG tablet TAKE 1 TABLET BY  MOUTH TWICE DAILY BEFORE A MEAL 60 tablet 0   potassium chloride  SA (KLOR-CON  M) 20 MEQ tablet Take 2 tablets (40 mEq total) by mouth 2 (two) times daily. 120 tablet 6   rOPINIRole  (REQUIP ) 0.5 MG tablet Take 0.5 mg by mouth at  bedtime.   0   scopolamine  (TRANSDERM-SCOP) 1 MG/3DAYS Place 1 patch (1.5 mg total) onto the skin every 3 (three) days. 10 patch 5   sertraline  (ZOLOFT ) 50 MG tablet Take 1 tablet (50 mg total) by mouth daily. 30 tablet 3   spironolactone  (ALDACTONE ) 25 MG tablet TAKE 1 TABLET BY MOUTH AT BEDTIME 30 tablet 0   sucralfate  (CARAFATE ) 1 GM/10ML suspension Take 10 mLs (1 g total) by mouth 4 (four) times daily. 420 mL 1   traZODone  (DESYREL ) 50 MG tablet Take 1 tablet (50 mg total) by mouth at bedtime. 90 tablet 1   varenicline  (CHANTIX ) 1 MG tablet Take 1 tablet (1 mg total) by mouth 2 (two) times daily. 60 tablet 3   Vitamin D , Ergocalciferol , (DRISDOL) 1.25 MG (50000 UNIT) CAPS capsule Take 50,000 Units by mouth every 7 (seven) days.     cyproheptadine  (PERIACTIN ) 4 MG tablet Take 8 mg by mouth 2 (two) times daily.     No facility-administered medications prior to visit.        Objective:   Physical Exam  Vitals:   06/14/23 1409  BP: 122/68  Pulse: 80  SpO2: 99%   Gen: Pleasant, well-nourished, in no distress,  normal affect  ENT: No lesions,  mouth clear,  oropharynx clear, no postnasal drip  Neck: No JVD, no stridor  Lungs: No use of accessory muscles, no crackles or wheezing on normal respiration, no wheeze on forced expiration  Cardiovascular: RRR, heart sounds normal, no murmur or gallops, no peripheral edema  Musculoskeletal: No deformities, no cyanosis or clubbing  Neuro: alert, awake, non focal  Skin: Warm, no lesions or rash      Assessment & Plan:  Assessment and Plan    Left Upper Lobe Ground Glass Nodule Stable since 05/10/2019 with recent cystic change noted on 04/15/2023 and confirmed on 04/28/2023. Differential includes cystic change vs bronchiectasis. Discussed the risk/benefit of bronchoscopy vs continued surveillance. Patient concerned and prefers definitive diagnosis. -Schedule bronchoscopy for biopsy. -Stop Aspirin  2 days prior and Plavix  5 days prior to  procedure. -Confirm plan with cardiologist.  Chronic Obstructive Pulmonary Disease Moderately severe obstruction without bronchodilator response noted on PFTs from 06/08/2016. FEV1 77% predicted. Decreased diffusion capacity. Patient reports occasional dyspnea but no recent exacerbations requiring prednisone  or antibiotics. -Continue current management.  Coronary Artery Disease/Cardiomyopathy Managed with AICD in place and on Aspirin  and Plavix . -Continue current management.  Tobacco Use Active smoker with significant reduction in use (from 1.5 packs/day to 2 cigarettes/week) with the help of Chantix . -Encourage continued smoking cessation efforts.        Lamar Chris, MD, PhD 06/14/2023, 2:13 PM [facility] [phone] or if no answer before 7:00PM call [phone] For any issues after 7:00PM please call eLink [phone]

## 2023-06-14 NOTE — Patient Instructions (Signed)
 YOUR PLAN:  -LEFT UPPER LOBE GROUND GLASS NODULE: A ground glass nodule is a hazy area in the lung that can indicate various conditions. Your nodule has shown some changes, and we discussed the options of bronchoscopy for a biopsy versus continued surveillance. You prefer a definitive diagnosis, so we will schedule a bronchoscopy. Please stop taking Aspirin  2 days before and Plavix  5 days before the procedure. We will confirm this plan with your cardiologist.  -CHRONIC OBSTRUCTIVE PULMONARY DISEASE (COPD): COPD is a chronic lung disease that makes it hard to breathe. Your condition shows moderately severe obstruction without improvement from bronchodilators. We will continue with your current management plan as you have not had recent exacerbations.  -CORONARY ARTERY DISEASE/CARDIOMYOPATHY: Coronary artery disease involves the narrowing of the heart's arteries, and cardiomyopathy is a disease of the heart muscle. Your condition is managed with an AICD (a device to help control irregular heartbeats) and medications like Aspirin  and Plavix . We will continue with your current management plan.  -TOBACCO USE: You have significantly reduced your smoking from 1.5 packs per day to 2 cigarettes per week with the help of Chantix . We encourage you to continue your efforts to quit smoking completely.  -HYPERLIPIDEMIA/HYPERTENSION: Hyperlipidemia is high levels of fats in the blood, and hypertension is high blood pressure. We will continue with your current management plan for these conditions.   INSTRUCTIONS:  Please follow up with the bronchoscopy as scheduled. Stop taking Aspirin  2 days before and Plavix  5 days before the procedure. We will confirm this plan with your cardiologist. Continue with your current management plans for COPD, coronary artery disease, cardiomyopathy, hyperlipidemia, hypertension. Keep up the good work on reducing your smoking, and aim to quit completely.

## 2023-06-15 ENCOUNTER — Encounter (HOSPITAL_COMMUNITY): Payer: Medicare PPO | Admitting: Occupational Therapy

## 2023-06-15 ENCOUNTER — Ambulatory Visit (INDEPENDENT_AMBULATORY_CARE_PROVIDER_SITE_OTHER): Payer: Medicare Other | Admitting: Dermatology

## 2023-06-15 ENCOUNTER — Encounter: Payer: Self-pay | Admitting: Dermatology

## 2023-06-15 VITALS — BP 83/63 | HR 92

## 2023-06-15 DIAGNOSIS — Z808 Family history of malignant neoplasm of other organs or systems: Secondary | ICD-10-CM

## 2023-06-15 DIAGNOSIS — D485 Neoplasm of uncertain behavior of skin: Secondary | ICD-10-CM

## 2023-06-15 DIAGNOSIS — Z85828 Personal history of other malignant neoplasm of skin: Secondary | ICD-10-CM

## 2023-06-15 DIAGNOSIS — C44319 Basal cell carcinoma of skin of other parts of face: Secondary | ICD-10-CM | POA: Diagnosis not present

## 2023-06-15 DIAGNOSIS — L570 Actinic keratosis: Secondary | ICD-10-CM

## 2023-06-15 DIAGNOSIS — D492 Neoplasm of unspecified behavior of bone, soft tissue, and skin: Secondary | ICD-10-CM

## 2023-06-15 DIAGNOSIS — L820 Inflamed seborrheic keratosis: Secondary | ICD-10-CM | POA: Diagnosis not present

## 2023-06-15 DIAGNOSIS — W908XXA Exposure to other nonionizing radiation, initial encounter: Secondary | ICD-10-CM | POA: Diagnosis not present

## 2023-06-15 DIAGNOSIS — Z5111 Encounter for antineoplastic chemotherapy: Secondary | ICD-10-CM | POA: Diagnosis not present

## 2023-06-15 MED ORDER — FLUOROURACIL 5 % EX CREA: TOPICAL_CREAM | Freq: Two times a day (BID) | CUTANEOUS | 0 refills | Status: AC

## 2023-06-15 NOTE — Telephone Encounter (Signed)
 She has been stable recently from heart failure perspective.  Moderate risk for anesthesia.  OK to hold the Plavix  and aspirin  as desired and restart after procedure.

## 2023-06-15 NOTE — Patient Instructions (Signed)
 Important Information  Due to recent changes in healthcare laws, you may see results of your pathology and/or laboratory studies on MyChart before the doctors have had a chance to review them. We understand that in some cases there may be results that are confusing or concerning to you. Please understand that not all results are received at the same time and often the doctors may need to interpret multiple results in order to provide you with the best plan of care or course of treatment. Therefore, we ask that you please give us  2 business days to thoroughly review all your results before contacting the office for clarification. Should we see a critical lab result, you will be contacted sooner.   If You Need Anything After Your Visit  If you have any questions or concerns for your doctor, please call our main line at [phone] If no one answers, please leave a voicemail as directed and we will return your call as soon as possible. Messages left after 4 pm will be answered the following business day.   You may also send us  a message via MyChart. We typically respond to MyChart messages within 1-2 business days.  For prescription refills, please ask your pharmacy to contact our office. Our fax number is [phone].  If you have an urgent issue when the clinic is closed that cannot wait until the next business day, you can page your doctor at the number below.    Please note that while we do our best to be available for urgent issues outside of office hours, we are not available 24/7.   If you have an urgent issue and are unable to reach us , you may choose to seek medical care at your doctor's office, retail clinic, urgent care center, or emergency room.  If you have a medical emergency, please immediately call 911 or go to the emergency department. In the event of inclement weather, please call our main line at [phone] for an update on the status of any delays or  closures.  Dermatology Medication Tips: Please keep the boxes that topical medications come in in order to help keep track of the instructions about where and how to use these. Pharmacies typically print the medication instructions only on the boxes and not directly on the medication tubes.   If your medication is too expensive, please contact our office at [phone] or send us  a message through MyChart.   We are unable to tell what your co-pay for medications will be in advance as this is different depending on your insurance coverage. However, we may be able to find a substitute medication at lower cost or fill out paperwork to get insurance to cover a needed medication.   If a prior authorization is required to get your medication covered by your insurance company, please allow us  1-2 business days to complete this process.  Drug prices often vary depending on where the prescription is filled and some pharmacies may offer cheaper prices.  The website www.goodrx.com contains coupons for medications through different pharmacies. The prices here do not account for what the cost may be with help from insurance (it may be cheaper with your insurance), but the website can give you the price if you did not use any insurance.  - You can print the associated coupon and take it with your prescription to the pharmacy.  - You may also stop by our office during regular business hours and pick up a GoodRx coupon card.  - If  you need your prescription sent electronically to a different pharmacy, notify our office through [facility] or by phone at [phone]    Skin Education :   I counseled the patient regarding the following: Sun screen (SPF 30 or greater) should be applied during peak UV exposure (between 10am and 2pm) and reapplied after exercise or swimming.  The ABCDEs of melanoma were reviewed with the patient, and the importance of monthly self-examination of moles was emphasized.  Should any moles change in shape or color, or itch, bleed or burn, pt will contact our office for evaluation sooner then their interval appointment.  Plan: Sunscreen Recommendations I recommended a broad spectrum sunscreen with a SPF of 30 or higher. I explained that SPF 30 sunscreens block approximately 97 percent of the sun's harmful rays. Sunscreens should be applied at least 15 minutes prior to expected sun exposure and then every 2 hours after that as long as sun exposure continues. If swimming or exercising sunscreen should be reapplied every 45 minutes to an hour after getting wet or sweating. One ounce, or the equivalent of a shot glass full of sunscreen, is adequate to protect the skin not covered by a bathing suit. I also recommended a lip balm with a sunscreen as well. Sun protective clothing can be used in lieu of sunscreen but must be worn the entire time you are exposed to the sun's rays.  Patient Handout: Wound Care for Skin Biopsy Site  Taking Care of Your Skin Biopsy Site  Proper care of the biopsy site is essential for promoting healing and minimizing scarring. This handout provides instructions on how to care for your biopsy site to ensure optimal recovery.  1. Cleaning the Wound:  Clean the biopsy site daily with gentle soap and water . Gently pat the area dry with a clean, soft towel. Avoid harsh scrubbing or rubbing the area, as this can irritate the skin and delay healing.  2. Applying Aquaphor and Bandage:  After cleaning the wound, apply a thin layer of Aquaphor ointment to the biopsy site. Cover the area with a sterile bandage to protect it from dirt, bacteria, and friction. Change the bandage daily or as needed if it becomes soiled or wet.  3. Continued Care for One Week:  Repeat the cleaning, Aquaphor application, and bandaging process daily for one week following the biopsy procedure. Keeping the wound clean and moist during this initial healing period will help  prevent infection and promote optimal healing.  4. Massaging Aquaphor into the Area:  ---After one week, discontinue the use of bandages but continue to apply Aquaphor to the biopsy site. ----Gently massage the Aquaphor into the area using circular motions. ---Massaging the skin helps to promote circulation and prevent the formation of scar tissue.   Additional Tips:  Avoid exposing the biopsy site to direct sunlight during the healing process, as this can cause hyperpigmentation or worsen scarring. If you experience any signs of infection, such as increased redness, swelling, warmth, or drainage from the wound, contact your healthcare provider immediately. Follow any additional instructions provided by your healthcare provider for caring for the biopsy site and managing any discomfort. Conclusion:  Taking proper care of your skin biopsy site is crucial for ensuring optimal healing and minimizing scarring. By following these instructions for cleaning, applying Aquaphor, and massaging the area, you can promote a smooth and successful recovery. If you have any questions or concerns about caring for your biopsy site, don't hesitate to contact your healthcare  provider for guidance.  - Start 5-fluorouracil  cream twice a day for 21 days to affected areas including nose.  Reviewed course of treatment and expected reaction.  Patient advised to expect inflammation and crusting and advised that erosions are possible.  Patient advised to be diligent with sun protection during and after treatment. Handout with details of how to apply medication and what to expect provided. Counseled to keep medication out of reach of children and pets.

## 2023-06-15 NOTE — Telephone Encounter (Signed)
 Thank you very much

## 2023-06-15 NOTE — Progress Notes (Signed)
 New Patient Visit   Subjective  Erin  KARISMA [name] is a 63 y.o. female who presents for the following: spots of concern.  Hx of NMSC (several BCCs and SCCs) and family hx of MM.  She has some concerns about lesions on her nose, present for months, itchy, scaly, tender, not previous treated.  The patient has spots, moles and lesions to be evaluated, some may be new or changing and the patient may have concern these could be cancer.   The following portions of the chart were reviewed this encounter and updated as appropriate: medications, allergies, medical history  Review of Systems:  No other skin or systemic complaints except as noted in HPI or Assessment and Plan.  Objective  Well appearing patient in no apparent distress; mood and affect are within normal limits.   A focused examination was performed of the following areas:  Face and shoulders  Relevant exam findings are noted in the Assessment and Plan.  Left Shoulder - Posterior 0.7 mm pink pearly papule  Right Malar Cheek 0.2 mm filiform pedunculated papule   Assessment & Plan   AK (ACTINIC KERATOSIS) Nose fluorouracil  (EFUDEX ) 5 % cream - Nose Apply topically 2 (two) times daily for 21 days. 2x a day for 3 wks on nose NEOPLASM OF UNCERTAIN BEHAVIOR OF SKIN (2) Left Shoulder - Posterior Skin / nail biopsy Type of biopsy: tangential   Informed consent: discussed and consent obtained   Timeout: patient name, date of birth, surgical site, and procedure verified   Procedure prep:  Patient was prepped and draped in usual sterile fashion Prep type:  Isopropyl alcohol Anesthesia: the lesion was anesthetized in a standard fashion   Anesthetic:  1% lidocaine  w/ epinephrine  1-100,000 buffered w/ 8.4% NaHCO3 Instrument used: DermaBlade   Hemostasis achieved with: aluminum chloride   Outcome: patient tolerated procedure well   Post-procedure details: sterile dressing applied and wound care instructions given   Dressing  type: bandage and petrolatum   Specimen 1 - Surgical pathology Differential Diagnosis: r/o nmsc vs other  Check Margins: No Right Malar Cheek Skin / nail biopsy Type of biopsy: tangential   Informed consent: discussed and consent obtained   Timeout: patient name, date of birth, surgical site, and procedure verified   Procedure prep:  Patient was prepped and draped in usual sterile fashion Prep type:  Isopropyl alcohol Anesthesia: the lesion was anesthetized in a standard fashion   Anesthetic:  1% lidocaine  w/ epinephrine  1-100,000 buffered w/ 8.4% NaHCO3 Instrument used: DermaBlade   Hemostasis achieved with: aluminum chloride   Outcome: patient tolerated procedure well   Post-procedure details: sterile dressing applied and wound care instructions given   Dressing type: bandage and petrolatum   Specimen 2 - Surgical pathology Differential Diagnosis: r/o wart vs other  Check Margins: No  ACTINIC KERATOSIS Exam: Erythematous thin papules/macules with gritty scale at the nose  Actinic keratoses are precancerous spots that appear secondary to cumulative UV radiation exposure/sun exposure over time. They are chronic with expected duration over 1 year. A portion of actinic keratoses will progress to squamous cell carcinoma of the skin. It is not possible to reliably predict which spots will progress to skin cancer and so treatment is recommended to prevent development of skin cancer.  Recommend daily broad spectrum sunscreen SPF 30+ to sun-exposed areas, reapply every 2 hours as needed.  Recommend staying in the shade or wearing long sleeves, sun glasses (UVA+UVB protection) and wide brim hats (4-inch brim around the entire circumference  of the hat). Call for new or changing lesions.  Treatment Plan: Start 5-fluorouracil  cream twice a day for 21 days to affected areas including nose.  Reviewed course of treatment and expected reaction.  Patient advised to expect inflammation and crusting  and advised that erosions are possible.  Patient advised to be diligent with sun protection during and after treatment. Handout with details of how to apply medication and what to expect provided. Counseled to keep medication out of reach of children and pets.  Reviewed course of treatment and expected reaction.  Patient advised to expect inflammation and crusting and advised that erosions are possible.  Patient advised to be diligent with sun protection during and after treatment. Handout with details of how to apply medication and what to expect provided. Counseled to keep medication out of reach of children and pets.  Return in about 3 months (around 09/12/2023) for tbsc.  LILLETTE Berwyn Baseman, Surg Tech III, am acting as scribe for RUFUS CHRISTELLA HOLY, MD.   Documentation: I have reviewed the above documentation for accuracy and completeness, and I agree with the above.  RUFUS CHRISTELLA HOLY, MD

## 2023-06-16 NOTE — Assessment & Plan Note (Signed)
 Assessment and Plan    Left Upper Lobe Ground Glass Nodule Stable since 05/10/2019 with recent cystic change noted on 04/15/2023 and confirmed on 04/28/2023. Differential includes cystic change vs bronchiectasis. Discussed the risk/benefit of bronchoscopy vs continued surveillance. Patient concerned and prefers definitive diagnosis. -Schedule bronchoscopy for biopsy. -Stop Aspirin  2 days prior and Plavix  5 days prior to procedure. -Confirm plan with cardiologist.  Chronic Obstructive Pulmonary Disease Moderately severe obstruction without bronchodilator response noted on PFTs from 06/08/2016. FEV1 77% predicted. Decreased diffusion capacity. Patient reports occasional dyspnea but no recent exacerbations requiring prednisone  or antibiotics. -Continue current management.  Coronary Artery Disease/Cardiomyopathy Managed with AICD in place and on Aspirin  and Plavix . -Continue current management.  Tobacco Use Active smoker with significant reduction in use (from 1.5 packs/day to 2 cigarettes/week) with the help of Chantix . -Encourage continued smoking cessation efforts.

## 2023-06-18 ENCOUNTER — Emergency Department (HOSPITAL_COMMUNITY)
Admission: EM | Admit: 2023-06-18 | Discharge: 2023-06-18 | Disposition: A | Discharge status: Home / Self Care | Payer: Medicare Other | Attending: Emergency Medicine | Admitting: Emergency Medicine

## 2023-06-18 ENCOUNTER — Emergency Department (HOSPITAL_COMMUNITY): Payer: Medicare Other

## 2023-06-18 ENCOUNTER — Encounter (HOSPITAL_COMMUNITY): Payer: Self-pay

## 2023-06-18 ENCOUNTER — Other Ambulatory Visit: Payer: Self-pay

## 2023-06-18 DIAGNOSIS — Z79899 Other long term (current) drug therapy: Secondary | ICD-10-CM | POA: Diagnosis not present

## 2023-06-18 DIAGNOSIS — F1290 Cannabis use, unspecified, uncomplicated: Secondary | ICD-10-CM | POA: Diagnosis not present

## 2023-06-18 DIAGNOSIS — Z7982 Long term (current) use of aspirin: Secondary | ICD-10-CM | POA: Diagnosis not present

## 2023-06-18 DIAGNOSIS — I2510 Atherosclerotic heart disease of native coronary artery without angina pectoris: Secondary | ICD-10-CM | POA: Insufficient documentation

## 2023-06-18 DIAGNOSIS — I110 Hypertensive heart disease with heart failure: Secondary | ICD-10-CM | POA: Diagnosis not present

## 2023-06-18 DIAGNOSIS — I509 Heart failure, unspecified: Secondary | ICD-10-CM | POA: Diagnosis not present

## 2023-06-18 DIAGNOSIS — R112 Nausea with vomiting, unspecified: Secondary | ICD-10-CM | POA: Insufficient documentation

## 2023-06-18 LAB — COMPREHENSIVE METABOLIC PANEL
ALT: 26 U/L (ref 0–44)
AST: 43 U/L — ABNORMAL HIGH (ref 15–41)
Albumin: 3.4 g/dL — ABNORMAL LOW (ref 3.5–5.0)
Alkaline Phosphatase: 38 U/L (ref 38–126)
Anion gap: 11 (ref 5–15)
BUN: 13 mg/dL (ref 8–23)
CO2: 19 mmol/L — ABNORMAL LOW (ref 22–32)
Calcium: 9.3 mg/dL (ref 8.9–10.3)
Chloride: 108 mmol/L (ref 98–111)
Creatinine, Ser: 1.19 mg/dL — ABNORMAL HIGH (ref 0.44–1.00)
GFR, Estimated: 52 mL/min — ABNORMAL LOW (ref >60)
Glucose, Bld: 101 mg/dL — ABNORMAL HIGH (ref 70–99)
Potassium: 3.5 mmol/L (ref 3.5–5.1)
Sodium: 138 mmol/L (ref 135–145)
Total Bilirubin: 0.7 mg/dL (ref 0.0–1.2)
Total Protein: 6.2 g/dL — ABNORMAL LOW (ref 6.5–8.1)

## 2023-06-18 LAB — URINALYSIS, ROUTINE W REFLEX MICROSCOPIC
Bacteria, UA: NONE SEEN
Bilirubin Urine: NEGATIVE
Glucose, UA: >=500 mg/dL — AB
Hgb urine dipstick: NEGATIVE
Ketones, ur: NEGATIVE mg/dL
Leukocytes,Ua: NEGATIVE
Nitrite: NEGATIVE
Protein, ur: 30 mg/dL — AB
Specific Gravity, Urine: 1.026 (ref 1.005–1.030)
pH: 5 (ref 5.0–8.0)

## 2023-06-18 LAB — CBC
HCT: 36.1 % (ref 36.0–46.0)
Hemoglobin: 12 g/dL (ref 12.0–15.0)
MCH: 26.4 pg (ref 26.0–34.0)
MCHC: 33.2 g/dL (ref 30.0–36.0)
MCV: 79.5 fL — ABNORMAL LOW (ref 80.0–100.0)
Platelets: 470 10*3/uL — ABNORMAL HIGH (ref 150–400)
RBC: 4.54 MIL/uL (ref 3.87–5.11)
RDW: 16.3 % — ABNORMAL HIGH (ref 11.5–15.5)
WBC: 7.8 10*3/uL (ref 4.0–10.5)
nRBC: 0 % (ref 0.0–0.2)

## 2023-06-18 LAB — LIPASE, BLOOD: Lipase: 78 U/L — ABNORMAL HIGH (ref 11–51)

## 2023-06-18 MED ORDER — SODIUM CHLORIDE 0.9 % IV BOLUS
1000.0000 mL | Freq: Once | INTRAVENOUS | Status: AC
  Administered 2023-06-18: 1000 mL via INTRAVENOUS

## 2023-06-18 MED ORDER — METOCLOPRAMIDE HCL 10 MG PO TABS: 10.0000 mg | ORAL_TABLET | Freq: Four times a day (QID) | ORAL | 0 refills | Status: DC

## 2023-06-18 MED ORDER — PANTOPRAZOLE SODIUM 20 MG PO TBEC: 20.0000 mg | DELAYED_RELEASE_TABLET | Freq: Every day | ORAL | 0 refills | Status: DC

## 2023-06-18 MED ORDER — PANTOPRAZOLE SODIUM 40 MG IV SOLR
40.0000 mg | Freq: Once | INTRAVENOUS | Status: AC
  Administered 2023-06-18: 40 mg via INTRAVENOUS
  Filled 2023-06-18: qty 10

## 2023-06-18 MED ORDER — METOCLOPRAMIDE HCL 5 MG/ML IJ SOLN
10.0000 mg | Freq: Once | INTRAMUSCULAR | Status: AC
  Administered 2023-06-18: 10 mg via INTRAVENOUS
  Filled 2023-06-18: qty 2

## 2023-06-18 MED ORDER — IOHEXOL 300 MG/ML  SOLN
100.0000 mL | Freq: Once | INTRAMUSCULAR | Status: AC | PRN
  Administered 2023-06-18: 100 mL via INTRAVENOUS

## 2023-06-18 NOTE — ED Triage Notes (Signed)
 Pt c/o N/V/D since Wednesday. Pt has had this intermittently since last July. Pt was seen by Dr. Laurian Pop office but never given her results a year ago. Pt states only pain is when she is vomiting. Pt unsure of fever but does have chills.

## 2023-06-18 NOTE — ED Notes (Signed)
 Pt ambulated to bathroom and became unstable stating she was dizzy. Pt stated dizzines when she walks has been going on for awhile . PA aware.

## 2023-06-18 NOTE — Discharge Instructions (Signed)
 Use the Reglan  in place of your Zofran , I am also refilling your Protonix  to help rule out acid reflux as the culprit for your symptoms.  As discussed your labs are basically stable although you do have a mild elevation in your lipase, however your CT scan confirms there does not appear to be any physical problems with your pancreas.  Plan to see Dr. Shaaron for further evaluation of your symptoms.

## 2023-06-18 NOTE — ED Provider Notes (Signed)
 [facility] AT [facility] Provider Note   CSN: [id number] Arrival date & time: 06/18/23  9071     History  Chief Complaint  Patient presents with   Emesis    Erin  RICKITA [name] is a 63 y.o. female with a history including hypertension, CAD, GERD, CHF, IBS, history of chronic nausea, potentially secondary to cannabinoid hyperemesis syndrome, presenting with worsening nausea and reduced p.o. intake.  She describes having chronic daily nausea for greater than 6 months, has been evaluated by gastroenterology for this with no clear cut diagnosis, although according to chart it appears there were some incompleted labs including to assess for adrenal insufficiency which had not been completed nor she has she obtained follow-up with them.  She was also advised at the time of her GI evaluation to stop marijuana, she has not done this but states the nausea preceded her starting to use marijuana, stating the marijuana is to try to prevent nausea at this time.  Denies fevers or chills, denies abdominal pain, chest pain, shortness of breath, diarrhea, she also denies acid reflux.  No history of PUD.  She is found no alleviators for symptoms.  She used to be on Zofran  and had also been prescribed Phenergan  suppositories, neither these medications relieve her nausea although Zofran  did when she initially started taking it.  The history is provided by the patient.       Home Medications Prior to Admission medications   Medication Sig Start Date End Date Taking? Authorizing Provider  metoCLOPramide  (REGLAN ) 10 MG tablet Take 1 tablet (10 mg total) by mouth every 6 (six) hours. 06/18/23  Yes Maggie Senseney, PA-C  pantoprazole  (PROTONIX ) 20 MG tablet Take 1 tablet (20 mg total) by mouth daily. 06/18/23  Yes Miel Wisener, Mliss, PA-C  aspirin  EC 81 MG tablet Take 81 mg by mouth every evening.     [provider]  atorvastatin  (LIPITOR ) 80 MG tablet Take 1 tablet (80 mg total) by mouth  daily. 09/05/22   Rolan Ezra RAMAN, MD  clopidogrel  (PLAVIX ) 75 MG tablet Take 1 tablet by mouth once daily 07/12/22   McLean, Dalton S, MD  dapagliflozin  propanediol (FARXIGA ) 10 MG TABS tablet Take 1 tablet (10 mg total) by mouth daily before breakfast. 09/12/22   Milford, Harlene HERO, FNP  EPINEPHrine  (EPIPEN  2-PAK) 0.3 mg/0.3 mL IJ SOAJ injection Inject 0.3 mg into the muscle as needed for anaphylaxis. 04/23/21   Iva Marty Saltness, MD  Evolocumab  (REPATHA  SURECLICK) 140 MG/ML SOAJ Inject 140 mg into the skin every 14 (fourteen) days. 09/07/22   Rolan Ezra RAMAN, MD  fenofibrate  (TRICOR ) 145 MG tablet Take 1 tablet (145 mg total) by mouth daily. 07/27/22   Rolan Ezra RAMAN, MD  fluorouracil  (EFUDEX ) 5 % cream Apply topically 2 (two) times daily for 21 days. 2x a day for 3 wks on nose 06/15/23 07/06/23  Paci, Karina M, MD  furosemide  (LASIX ) 20 MG tablet TAKE 40MG  (2) TABLETS IN THE MORNING AND 20MG  (1) TABLET IN THE EVENING 05/26/23   Milford, Harlene HERO, FNP  gabapentin  (NEURONTIN ) 300 MG capsule Take 1 capsule by mouth at bedtime 06/06/23   Melvenia Manus BRAVO, MD  hydrOXYzine  (VISTARIL ) 25 MG capsule Take 1 capsule (25 mg total) by mouth every 8 (eight) hours as needed. 04/20/23   Zarwolo, Gloria, FNP  linaclotide  (LINZESS ) 145 MCG CAPS capsule Take 1 capsule (145 mcg total) by mouth daily before breakfast. 11/22/22   Rourk, Lamar HERO, MD  loperamide  (IMODIUM   A-D) 2 MG tablet Take 2 mg by mouth 4 (four) times daily as needed for diarrhea or loose stools.    [provider]  losartan  (COZAAR ) 25 MG tablet Take 0.5 tablets (12.5 mg total) by mouth at bedtime. 03/28/23   Rolan Ezra RAMAN, MD  metoprolol  succinate (TOPROL  XL) 25 MG 24 hr tablet Take 1 tablet (25 mg total) by mouth daily. Patient taking differently: Take 12.5 mg by mouth daily. 12/19/22   Rolan Ezra RAMAN, MD  Multiple Vitamins-Minerals (MULTIVITAMIN WITH MINERALS) tablet Take 1 tablet by mouth daily.    [provider]   nitroGLYCERIN  (NITROSTAT ) 0.4 MG SL tablet Place 1 tablet (0.4 mg total) under the tongue every 5 (five) minutes x 3 doses as needed for chest pain (if no relief after 2nd dose, proceed to the ED for an evaluation or call 911). 09/19/22   Rolan Ezra RAMAN, MD  ondansetron  (ZOFRAN ) 4 MG tablet Take 1 tablet (4 mg total) by mouth every 6 (six) hours as needed for nausea or vomiting. 11/23/22   Rourk, Lamar HERO, MD  potassium chloride  SA (KLOR-CON  M) 20 MEQ tablet Take 2 tablets (40 mEq total) by mouth 2 (two) times daily. 04/25/23   Milford, Harlene HERO, FNP  rOPINIRole  (REQUIP ) 0.5 MG tablet Take 0.5 mg by mouth at bedtime.  11/13/17   [provider]  scopolamine  (TRANSDERM-SCOP) 1 MG/3DAYS Place 1 patch (1.5 mg total) onto the skin every 3 (three) days. 01/12/23   Minnie Tinnie BRAVO, PA  sertraline  (ZOLOFT ) 50 MG tablet Take 1 tablet (50 mg total) by mouth daily. 06/01/23   Melvenia Manus BRAVO, MD  spironolactone  (ALDACTONE ) 25 MG tablet TAKE 1 TABLET BY MOUTH AT BEDTIME 05/08/23   McLean, Dalton S, MD  sucralfate  (CARAFATE ) 1 GM/10ML suspension Take 10 mLs (1 g total) by mouth 4 (four) times daily. 04/27/22 05/25/24  Cindie Carlin POUR, DO  traZODone  (DESYREL ) 50 MG tablet Take 1 tablet (50 mg total) by mouth at bedtime. 06/01/23   Melvenia Manus BRAVO, MD  varenicline  (CHANTIX ) 1 MG tablet Take 1 tablet (1 mg total) by mouth 2 (two) times daily. 09/12/22   Milford, Harlene HERO, FNP  Vitamin D , Ergocalciferol , (DRISDOL) 1.25 MG (50000 UNIT) CAPS capsule Take 50,000 Units by mouth every 7 (seven) days.    [provider]      Allergies    Alpha-gal, Other, and Tape    Review of Systems   Review of Systems  Constitutional:  Positive for appetite change. Negative for chills and fever.  HENT:  Negative for congestion and sore throat.   Eyes: Negative.   Respiratory:  Negative for chest tightness and shortness of breath.   Cardiovascular:  Negative for chest pain and palpitations.  Gastrointestinal:   Positive for nausea. Negative for abdominal distention, abdominal pain and vomiting.  Genitourinary: Negative.   Musculoskeletal:  Negative for arthralgias, joint swelling and neck pain.  Skin: Negative.  Negative for rash and wound.  Neurological:  Negative for dizziness, weakness, light-headedness, numbness and headaches.  Psychiatric/Behavioral: Negative.      Physical Exam Updated Vital Signs BP (!) 140/91   Pulse 68   Temp 98 F (36.7 C) (Oral)   Resp 17   Ht 5' (1.524 m)   Wt 46.7 kg   SpO2 99%   BMI 20.12 kg/m  Physical Exam Vitals and nursing note reviewed.  Constitutional:      Appearance: She is well-developed.  HENT:     Head: Normocephalic  and atraumatic.  Eyes:     Conjunctiva/sclera: Conjunctivae normal.  Cardiovascular:     Rate and Rhythm: Normal rate and regular rhythm.     Heart sounds: Normal heart sounds.  Pulmonary:     Effort: Pulmonary effort is normal.     Breath sounds: Normal breath sounds. No wheezing.  Abdominal:     General: Bowel sounds are normal.     Palpations: Abdomen is soft.     Tenderness: There is no abdominal tenderness.  Musculoskeletal:        General: Normal range of motion.     Cervical back: Normal range of motion.  Skin:    General: Skin is warm and dry.  Neurological:     Mental Status: She is alert.     ED Results / Procedures / Treatments   Labs (all labs ordered are listed, but only abnormal results are displayed) Labs Reviewed  LIPASE, BLOOD - Abnormal; Notable for the following components:      Result Value   Lipase 78 (*)    All other components within normal limits  COMPREHENSIVE METABOLIC PANEL - Abnormal; Notable for the following components:   CO2 19 (*)    Glucose, Bld 101 (*)    Creatinine, Ser 1.19 (*)    Total Protein 6.2 (*)    Albumin  3.4 (*)    AST 43 (*)    GFR, Estimated 52 (*)    All other components within normal limits  CBC - Abnormal; Notable for the following components:   MCV 79.5  (*)    RDW 16.3 (*)    Platelets 470 (*)    All other components within normal limits  URINALYSIS, ROUTINE W REFLEX MICROSCOPIC - Abnormal; Notable for the following components:   APPearance HAZY (*)    Glucose, UA >=500 (*)    Protein, ur 30 (*)    All other components within normal limits    EKG None  Radiology CT ABDOMEN PELVIS W CONTRAST Result Date: 06/18/2023 CLINICAL DATA:  Intermittent abdominal pain with vomiting. EXAM: CT ABDOMEN AND PELVIS WITH CONTRAST TECHNIQUE: Multidetector CT imaging of the abdomen and pelvis was performed using the standard protocol following bolus administration of intravenous contrast. RADIATION DOSE REDUCTION: This exam was performed according to the departmental dose-optimization program which includes automated exposure control, adjustment of the mA and/or kV according to patient size and/or use of iterative reconstruction technique. CONTRAST:  OMNIPAQUE  IOHEXOL  300 MG/ML  SOLN COMPARISON:  08/07/2022 FINDINGS: Lower chest: Unremarkable. Hepatobiliary: At least 3 tiny hypoattenuating lesions are seen in the liver, previously characterized as benign adrenal adenomas on MRI abdomen 01/06/2023 and stable since that time. Gallbladder is surgically absent. No intrahepatic or extrahepatic biliary dilation. Pancreas: No focal mass lesion. No dilatation of the main duct. No intraparenchymal cyst. No peripancreatic edema. Spleen: No splenomegaly. No suspicious focal mass lesion. Adrenals/Urinary Tract: Stable bilateral adrenal nodules, left greater than right and previously characterized as adenomas (MRI 01/06/2023. Left adrenal adenoma measures 2.4 cm today which compares to 2.6 cm when remeasured in a similar fashion on the previous CT. Kidneys unremarkable. No evidence for hydroureter.The urinary bladder appears normal for the degree of distention. Stomach/Bowel: Stomach is unremarkable. No gastric wall thickening. No evidence of outlet obstruction. Duodenum is  normally positioned as is the ligament of Treitz. No small bowel wall thickening. No small bowel dilatation. The terminal ileum is normal. The appendix is not well visualized, but there is no edema or inflammation in the  region of the cecal tip to suggest appendicitis. No gross colonic mass. No colonic wall thickening. Vascular/Lymphatic: There is advanced atherosclerotic calcification of the abdominal aorta without aneurysm. Abdominal aorta measures up to 3.2 cm diameter with prominent mural thrombus. Accessory renal arteries noted bilaterally. There is no gastrohepatic or hepatoduodenal ligament lymphadenopathy. No retroperitoneal or mesenteric lymphadenopathy. No pelvic sidewall lymphadenopathy. Reproductive: Hysterectomy.  There is no adnexal mass. Other: No intraperitoneal free fluid. Musculoskeletal: No worrisome lytic or sclerotic osseous abnormality. IMPRESSION: 1. No acute findings in the abdomen or pelvis. Specifically, no findings to explain the patient's history of intermittent abdominal pain with vomiting. 2. Stable bilateral adrenal nodules, left greater than right and previously characterized as adenomas (MRI 01/06/2023). 3. Stable tiny hypoattenuating lesions in the liver, previously characterized as benign adrenal adenomas on MRI abdomen 01/06/2023 and stable since that time. 4. Juxtarenal 3.2 cm abdominal aortic aneurysm with accessory renal arteries noted bilaterally. 5.  Aortic Atherosclerosis (ICD10-I70.0). Electronically Signed   By: Camellia Candle M.D.   On: 06/18/2023 13:57    Procedures Procedures    Medications Ordered in ED Medications  sodium chloride  0.9 % bolus 1,000 mL (0 mLs Intravenous Stopped 06/18/23 1156)  metoCLOPramide  (REGLAN ) injection 10 mg (10 mg Intravenous Given 06/18/23 1054)  pantoprazole  (PROTONIX ) injection 40 mg (40 mg Intravenous Given 06/18/23 1054)  iohexol  (OMNIPAQUE ) 300 MG/ML solution 100 mL (100 mLs Intravenous Contrast Given 06/18/23 1326)    ED Course/  Medical Decision Making/ A&P                                 Medical Decision Making Patient presenting with acute on chronic nausea without clear source, she has been seen by GI for this in the past.  Her Zofran  has stopped working, she denies classic acid reflux/waterbrash type symptoms, she also denies chest pain or shortness of breath, no abdominal pain or vomiting.  It is possible her nausea is secondary to cannabinoid hyperemesis syndrome, although patient denies this possibility.  This could also be an atypical GERD symptom.  She does not have a gallbladder, she does have a small elevation in her lipase today at 78, her AST is 43, ALT normal at 26.  Discussed CT imaging which patient was agreeable looking for any pancreatic or hepatic source of her nausea symptoms, negative study.  She was given Reglan  and Protonix  here and had no further nausea and was able to tolerate p.o. fluids prior to discharge home.  Prescribed both of these in place of her Zofran , encouraged close follow-up with GI for further evaluation if symptoms persist.  Amount and/or Complexity of Data Reviewed Labs: ordered.    Details: Labs per above with abnormalities noted. Radiology: ordered.    Details: CT abdomen pelvis negative for acute findings  Risk Prescription drug management.           Final Clinical Impression(s) / ED Diagnoses Final diagnoses:  Nausea and vomiting, unspecified vomiting type    Rx / DC Orders ED Discharge Orders          Ordered    metoCLOPramide  (REGLAN ) 10 MG tablet  Every 6 hours        06/18/23 1507    pantoprazole  (PROTONIX ) 20 MG tablet  Daily        06/18/23 1507              Birdena Clarity, PA-C 06/18/23 1622    Towana,  Ozell BROCKS, MD 06/18/23 [phone]

## 2023-06-19 ENCOUNTER — Encounter: Payer: Self-pay | Admitting: Orthopedic Surgery

## 2023-06-19 ENCOUNTER — Ambulatory Visit (INDEPENDENT_AMBULATORY_CARE_PROVIDER_SITE_OTHER): Payer: Medicare Other | Admitting: Orthopedic Surgery

## 2023-06-19 ENCOUNTER — Telehealth: Payer: Self-pay

## 2023-06-19 DIAGNOSIS — Z96612 Presence of left artificial shoulder joint: Secondary | ICD-10-CM

## 2023-06-19 LAB — SURGICAL PATHOLOGY

## 2023-06-19 NOTE — Progress Notes (Addendum)
Post-Op Visit Note   Patient: Erin [name]           Date of Birth: 10/12/1960           MRN: [id number] Visit Date: 06/19/2023 PCP: Billie Lade, MD   Assessment & Plan:  Chief Complaint:  Chief Complaint  Patient presents with   Left Shoulder - Follow-up, Routine Post Op   Visit Diagnoses:  1. S/P reverse total shoulder arthroplasty, left     Plan: [name] is a 63 year old patient who underwent left reverse shoulder replacement 03/21/2023.  She has some pain at times.  Takes tramadol at night.  Does physical therapy twice a week.  Overall she states she is doing well.  She has had an injection in the right shoulder which helped but did not last.  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  She would like to have the right shoulder done at some time as well.  On examination of that left-hand side she has range of motion of 50/95/160 with good subscap strength.  Plan at this time is to continue with stretching and strengthening exercises.  She will call when she wants to start the process for evaluating the right shoulder.  Follow-Up Instructions: No follow-ups on file.   Orders:  No orders of the defined types were placed in this encounter.  No orders of the defined types were placed in this encounter.   Imaging: No results found.  PMFS History: Patient Active Problem List   Diagnosis Date Noted   Panic attacks 04/21/2023   Pulmonary nodule 1 cm or greater in diameter 04/21/2023   Arthritis of left shoulder region 04/02/2023   Biceps tendonitis on left 04/02/2023   OA (osteoarthritis) of shoulder 03/21/2023   S/P reverse total shoulder arthroplasty, left 03/21/2023   RUQ pain 06/29/2022   Pancreatic cyst 06/29/2022   Peripheral neuropathy 05/04/2022   Restless leg syndrome 05/04/2022   Hypertriglyceridemia 05/04/2022   Bilateral shoulder pain 05/04/2022   Encounter for general adult medical examination with abnormal findings 05/04/2022   H/O  total hysterectomy 04/26/2022   Odynophagia 04/20/2022   Abdominal pain, epigastric 04/20/2022   Nausea without vomiting 06/16/2021   Allergy to alpha-gal 06/16/2021   Orthostatic hypotension 06/16/2021   Hypocortisolemia (HCC) 03/02/2021   Adrenal adenoma, left 03/02/2021   AKI (acute kidney injury) (HCC) 01/04/2021   Intractable nausea and vomiting 01/03/2021   Left-sided weakness 10/19/2020   Cough 02/11/2020   Near syncope 12/10/2019   Brain aneurysm 04/15/2019   Dysphagia 02/27/2018   Encounter for screening colonoscopy 02/27/2018   History of Clostridium difficile infection 02/27/2018   Chronic diarrhea 12/20/2017   Chronic combined systolic and diastolic congestive heart failure (HCC) 06/20/2016   Hypokalemia due to excessive gastrointestinal loss of potassium    Acute CHF (congestive heart failure) (HCC) 05/16/2016   Acute on chronic systolic CHF (congestive heart failure) (HCC) 05/16/2016   Acute respiratory failure with hypoxia (HCC)    Thrombocytosis 03/26/2016   Cardiomyopathy, ischemic 03/25/2016   Chronic combined systolic and diastolic heart failure (HCC) 03/25/2016   Anxiety state 03/25/2016   Troponin level elevated 03/25/2016   Coronary artery disease involving native coronary artery of native heart 03/25/2016   Normocytic anemia 03/25/2016   SOB (shortness of breath) 03/24/2016   Lightheadedness 03/17/2016   Hypotension 03/17/2016   Tobacco abuse 03/12/2016   NSTEMI (non-ST elevated myocardial infarction) (HCC) 03/11/2016   Atypical chest pain    Essential hypertension 09/06/2015  Hyperlipidemia 09/06/2015   GERD without esophagitis 09/06/2015   Chest pain 09/06/2015   Past Medical History:  Diagnosis Date   AICD (automatic cardioverter/defibrillator) present    Allergic reaction to alpha-gal    Allergy 03/05/2022   Anemia    Anxiety state 03/25/2016   Arthritis    Basal cell carcinoma of forehead    Brain aneurysm    CHF (congestive heart  failure) (HCC)    Coronary artery disease    a. 03/11/16 PCI with DES-->Prox/Mid Cx;  b. 03/14/16 PCI with DES x2-->RCA, EF 30-35%.   Depression 06/09/2010   Encounter for general adult medical examination with abnormal findings 05/04/2022   Essential hypertension    Hx   GERD (gastroesophageal reflux disease)    HFrEF (heart failure with reduced ejection fraction) (HCC)    a. 10/2016 Echo: EF 35-40%, Gr1 DD, mild focal basal septal hypertrophy, basal inflat, mid inflat, basal antlat AK. Mid infept/inf/antlat, apical lateral sev HK. Mod MR. mildly reduced RV fxn. Mild TR.   History of pneumonia    Hyperlipidemia    IBS (irritable bowel syndrome)    Ischemic cardiomyopathy    a. 10/2016 Echo: EF 35-40%, Gr1 DD.   Mitral regurgitation    Neuromuscular disorder (HCC)    NSTEMI (non-ST elevated myocardial infarction) (HCC) 03/10/2016   Pneumonia 03/2016   Shingles    Squamous cell cancer of skin of nose    Thrombocytosis 03/26/2016   Tobacco abuse    Trichimoniasis    Wears dentures    Wears glasses     Family History  Problem Relation Age of Onset   Stroke Mother    Hypertension Mother    Diabetes Mother    Heart attack Mother    Heart attack Father    Diabetes Father    Hypertension Father    CAD Father    Colon polyps Father 40       pre-cancerous    Stroke Father    Dementia Father    Hyperlipidemia Father    Arthritis Father    COPD Father        smoked and worked [employer]   Heart disease Father    Breast cancer Maternal Grandmother    Diabetes Maternal Grandmother    Cancer Maternal Grandfather        Tongue and esophageal   Cancer Paternal Grandmother    Anxiety disorder Daughter    Depression Daughter    Anxiety disorder Daughter    Heart failure Other    Colon cancer Neg Hx     Past Surgical History:  Procedure Laterality Date   APPENDECTOMY     BICEPT TENODESIS Left 03/21/2023   Procedure: BICEPS TENODESIS;  Surgeon: Cammy Copa, MD;   Location: [facility] OR;  Service: Orthopedics;  Laterality: Left;   BIOPSY  09/20/2018   Procedure: BIOPSY;  Surgeon: Corbin Ade, MD;  Location: [facility];  Service: Endoscopy;;  colon   BIOPSY  01/05/2021   Procedure: BIOPSY;  Surgeon: Rachael Fee, MD;  Location: [facility] ENDOSCOPY;  Service: Endoscopy;;   BIOPSY  04/27/2022   Procedure: BIOPSY;  Surgeon: Lanelle Bal, DO;  Location: [facility];  Service: Endoscopy;;   CARDIAC CATHETERIZATION N/A 03/11/2016   Procedure: Left Heart Cath and Coronary Angiography;  Surgeon: Marykay Lex, MD;  Location: [facility] INVASIVE CV LAB;  Service: Cardiovascular;  Laterality: N/A;   CARDIAC CATHETERIZATION N/A 03/11/2016   Procedure: Coronary Stent Intervention;  Surgeon: Onalee Hua  Starr Lake, MD;  Location: [facility] INVASIVE CV LAB;  Service: Cardiovascular;  Laterality: N/A;   CARDIAC CATHETERIZATION N/A 03/14/2016   Procedure: Coronary Stent Intervention;  Surgeon: Peter M Swaziland, MD;  Location: [facility] INVASIVE CV LAB;  Service: Cardiovascular;  Laterality: N/A;   CEREBRAL ANEURYSM REPAIR  04/2019   stent placed   CHOLECYSTECTOMY OPEN  1984   COLONOSCOPY WITH PROPOFOL N/A 09/20/2018   Procedure: COLONOSCOPY WITH PROPOFOL;  Surgeon: Corbin Ade, MD;  [facility];  Service: Endoscopy;  Laterality: N/A;  10:30am   CORONARY ANGIOPLASTY WITH STENT PLACEMENT  03/14/2016   ESOPHAGOGASTRODUODENOSCOPY (EGD) WITH PROPOFOL N/A 09/20/2018   Procedure: ESOPHAGOGASTRODUODENOSCOPY (EGD) WITH PROPOFOL;  Surgeon: Corbin Ade, MD;  [facility];  Service: Endoscopy;  Laterality: N/A;   ESOPHAGOGASTRODUODENOSCOPY (EGD) WITH PROPOFOL N/A 01/05/2021   Procedure: ESOPHAGOGASTRODUODENOSCOPY (EGD) WITH PROPOFOL;  Surgeon: Rachael Fee, MD;  Location: [facility] ENDOSCOPY;  Service: Endoscopy;  Laterality: N/A;   ESOPHAGOGASTRODUODENOSCOPY (EGD) WITH PROPOFOL N/A 04/27/2022   Procedure: ESOPHAGOGASTRODUODENOSCOPY (EGD) WITH PROPOFOL;  Surgeon: Lanelle Bal, DO;  [facility];  Service: Endoscopy;  Laterality: N/A;  9:15am, asa 3/4, ASAP   ESOPHAGOGASTRODUODENOSCOPY (EGD) WITH PROPOFOL N/A 01/04/2023   Procedure: ESOPHAGOGASTRODUODENOSCOPY (EGD) WITH PROPOFOL;  Surgeon: Corbin Ade, MD;  [facility];  Service: Endoscopy;  Laterality: N/A;  130pm, asa 3   FINGER ARTHROPLASTY Left 05/14/2013   Procedure: LEFT THUMB CARPAL METACARPAL ARTHROPLASTY;  Surgeon: Tami Ribas, MD;  Location: [facility];  Service: Orthopedics;  Laterality: Left;   ICD IMPLANT N/A 04/03/2020   Procedure: ICD IMPLANT;  Surgeon: Regan Lemming, MD;  Location[facility] INVASIVE CV LAB;  Service: Cardiovascular;  Laterality: N/A;   IR ANGIO INTRA EXTRACRAN SEL COM CAROTID INNOMINATE BILAT MOD SED  01/05/2017   IR ANGIO INTRA EXTRACRAN SEL COM CAROTID INNOMINATE BILAT MOD SED  03/19/2019   IR ANGIO INTRA EXTRACRAN SEL COM CAROTID INNOMINATE BILAT MOD SED  06/04/2020   IR ANGIO INTRA EXTRACRAN SEL INTERNAL CAROTID UNI L MOD SED  04/15/2019   IR ANGIO VERTEBRAL SEL VERTEBRAL BILAT MOD SED  01/05/2017   IR ANGIO VERTEBRAL SEL VERTEBRAL BILAT MOD SED  03/19/2019   IR ANGIO VERTEBRAL SEL VERTEBRAL UNI L MOD SED  06/04/2020   IR ANGIOGRAM FOLLOW UP STUDY  04/15/2019   IR RADIOLOGIST EVAL & MGMT  12/30/2016   IR TRANSCATH/EMBOLIZ  04/15/2019   IR US GUIDE VASC ACCESS RIGHT  03/19/2019   IR US GUIDE VASC ACCESS RIGHT  06/04/2020   MALONEY DILATION N/A 09/20/2018   Procedure: Elease Hashimoto DILATION;  Surgeon: Corbin Ade, MD;  [facility];  Service: Endoscopy;  Laterality: N/A;   MALONEY DILATION N/A 01/04/2023   Procedure: Elease Hashimoto DILATION;  Surgeon: Corbin Ade, MD;  Location: [facility];  Service: Endoscopy;  Laterality: N/A;   RADIOLOGY WITH ANESTHESIA N/A 04/15/2019   Procedure: Sharman Crate;  Surgeon: Julieanne Cotton, MD;  Location: [facility] OR;  Service: Radiology;  Laterality: N/A;   REVERSE SHOULDER ARTHROPLASTY  Left 03/21/2023   Procedure: LEFT REVERSE SHOULDER ARTHROPLASTY;  Surgeon: Cammy Copa, MD;  Location: [facility] OR;  Service: Orthopedics;  Laterality: Left;   RIGHT/LEFT HEART CATH AND CORONARY ANGIOGRAPHY N/A 08/19/2019   Procedure: RIGHT/LEFT HEART CATH AND CORONARY ANGIOGRAPHY;  Surgeon: Laurey Morale, MD;  Location: [facility] INVASIVE CV LAB;  Service: Cardiovascular;  Laterality: N/A;   TUBAL LIGATION  1987  VAGINAL HYSTERECTOMY  2009   Social History   Occupational History   Occupation: CNA  Tobacco Use   Smoking status: Some Days    Current packs/day: 1.50    Average packs/day: 1.5 packs/day for 40.0 years (60.0 ttl pk-yrs)    Types: Cigarettes   Smokeless tobacco: Never   Tobacco comments:    Smokes off and on 06/14/23 [name], CMA   Vaping Use   Vaping status: Never Used  Substance and Sexual Activity   Alcohol use: Not Currently    Comment: occasionally   Drug use: Yes    Types: Marijuana   Sexual activity: Not Currently    Birth control/protection: Surgical    Comment: hyst

## 2023-06-19 NOTE — Telephone Encounter (Signed)
 Patient called. She went to pick up fluorouracil  and it was $140. She would like to know if there is a less expensive option for her to use,

## 2023-06-20 ENCOUNTER — Encounter (HOSPITAL_COMMUNITY): Payer: Self-pay | Admitting: Occupational Therapy

## 2023-06-20 ENCOUNTER — Ambulatory Visit (HOSPITAL_COMMUNITY): Payer: Medicare Other | Admitting: Occupational Therapy

## 2023-06-20 ENCOUNTER — Telehealth: Payer: Self-pay | Admitting: Dermatology

## 2023-06-20 DIAGNOSIS — M25512 Pain in left shoulder: Secondary | ICD-10-CM | POA: Diagnosis not present

## 2023-06-20 DIAGNOSIS — M25612 Stiffness of left shoulder, not elsewhere classified: Secondary | ICD-10-CM

## 2023-06-20 DIAGNOSIS — R29898 Other symptoms and signs involving the musculoskeletal system: Secondary | ICD-10-CM

## 2023-06-20 NOTE — Telephone Encounter (Signed)
-----   Message from [facility] sent at 06/20/2023  9:18 AM EST ----- 1. ISK- shoulder- reassurance 2. BCC- right malar cheek- Mohs with me  Please call patient to discuss diagnosis and schedule for Mohs surgery.

## 2023-06-20 NOTE — Therapy (Signed)
 OUTPATIENT OCCUPATIONAL THERAPY ORTHO TREATMENT NOTE   Patient Name: Erin [name] MRN: [id number] DOB:04-Sep-1960, 63 y.o., female Today's Date: 06/20/2023   END OF SESSION:  OT End of Session - 06/20/23 1224     Visit Number 9    Number of Visits 17    Date for OT Re-Evaluation 07/14/23    Authorization Type [employer]    Authorization Time Period requesting 8 more visits    Progress Note Due on Visit 10    OT Start Time 1103    OT Stop Time 1143    OT Time Calculation (min) 40 min    Activity Tolerance Patient tolerated treatment well    Behavior During Therapy [facility] for tasks assessed/performed              Past Medical History:  Diagnosis Date   AICD (automatic cardioverter/defibrillator) present    Allergic reaction to alpha-gal    Allergy 03/05/2022   Anemia    Anxiety state 03/25/2016   Arthritis    Basal cell carcinoma of forehead    Brain aneurysm    CHF (congestive heart failure) (HCC)    Coronary artery disease    a. 03/11/16 PCI with DES-->Prox/Mid Cx;  b. 03/14/16 PCI with DES x2-->RCA, EF 30-35%.   Depression 06/09/2010   Encounter for general adult medical examination with abnormal findings 05/04/2022   Essential hypertension    Hx   GERD (gastroesophageal reflux disease)    HFrEF (heart failure with reduced ejection fraction) (HCC)    a. 10/2016 Echo: EF 35-40%, Gr1 DD, mild focal basal septal hypertrophy, basal inflat, mid inflat, basal antlat AK. Mid infept/inf/antlat, apical lateral sev HK. Mod MR. mildly reduced RV fxn. Mild TR.   History of pneumonia    Hyperlipidemia    IBS (irritable bowel syndrome)    Ischemic cardiomyopathy    a. 10/2016 Echo: EF 35-40%, Gr1 DD.   Mitral regurgitation    Neuromuscular disorder (HCC)    NSTEMI (non-ST elevated myocardial infarction) (HCC) 03/10/2016   Pneumonia 03/2016   Shingles    Squamous cell cancer of skin of nose    Thrombocytosis 03/26/2016   Tobacco abuse    Trichimoniasis    Wears  dentures    Wears glasses    Past Surgical History:  Procedure Laterality Date   APPENDECTOMY     BICEPT TENODESIS Left 03/21/2023   Procedure: BICEPS TENODESIS;  Surgeon: Cammy Copa, MD;  Location: [facility] OR;  Service: Orthopedics;  Laterality: Left;   BIOPSY  09/20/2018   Procedure: BIOPSY;  Surgeon: Corbin Ade, MD;  Location: [facility];  Service: Endoscopy;;  colon   BIOPSY  01/05/2021   Procedure: BIOPSY;  Surgeon: Rachael Fee, MD;  Location: [facility] ENDOSCOPY;  Service: Endoscopy;;   BIOPSY  04/27/2022   Procedure: BIOPSY;  Surgeon: Lanelle Bal, DO;  Location: [facility];  Service: Endoscopy;;   CARDIAC CATHETERIZATION N/A 03/11/2016   Procedure: Left Heart Cath and Coronary Angiography;  Surgeon: Marykay Lex, MD;  Location: [facility] INVASIVE CV LAB;  Service: Cardiovascular;  Laterality: N/A;   CARDIAC CATHETERIZATION N/A 03/11/2016   Procedure: Coronary Stent Intervention;  Surgeon: Marykay Lex, MD;  Location: [facility] INVASIVE CV LAB;  Service: Cardiovascular;  Laterality: N/A;   CARDIAC CATHETERIZATION N/A 03/14/2016   Procedure: Coronary Stent Intervention;  Surgeon: Peter M Swaziland, MD;  Location: [facility] INVASIVE CV LAB;  Service: Cardiovascular;  Laterality: N/A;   CEREBRAL ANEURYSM REPAIR  04/2019  stent placed   CHOLECYSTECTOMY OPEN  1984   COLONOSCOPY WITH PROPOFOL N/A 09/20/2018   Procedure: COLONOSCOPY WITH PROPOFOL;  Surgeon: Corbin Ade, MD;  Location: [facility];  Service: Endoscopy;  Laterality: N/A;  10:30am   CORONARY ANGIOPLASTY WITH STENT PLACEMENT  03/14/2016   ESOPHAGOGASTRODUODENOSCOPY (EGD) WITH PROPOFOL N/A 09/20/2018   Procedure: ESOPHAGOGASTRODUODENOSCOPY (EGD) WITH PROPOFOL;  Surgeon: Corbin Ade, MD;  [facility];  Service: Endoscopy;  Laterality: N/A;   ESOPHAGOGASTRODUODENOSCOPY (EGD) WITH PROPOFOL N/A 01/05/2021   Procedure: ESOPHAGOGASTRODUODENOSCOPY (EGD) WITH PROPOFOL;  Surgeon: Rachael Fee, MD;  Location:  [facility] ENDOSCOPY;  Service: Endoscopy;  Laterality: N/A;   ESOPHAGOGASTRODUODENOSCOPY (EGD) WITH PROPOFOL N/A 04/27/2022   Procedure: ESOPHAGOGASTRODUODENOSCOPY (EGD) WITH PROPOFOL;  Surgeon: Lanelle Bal, DO;  Location: [facility];  Service: Endoscopy;  Laterality: N/A;  9:15am, asa 3/4, ASAP   ESOPHAGOGASTRODUODENOSCOPY (EGD) WITH PROPOFOL N/A 01/04/2023   Procedure: ESOPHAGOGASTRODUODENOSCOPY (EGD) WITH PROPOFOL;  Surgeon: Corbin Ade, MD;  [facility];  Service: Endoscopy;  Laterality: N/A;  130pm, asa 3   FINGER ARTHROPLASTY Left 05/14/2013   Procedure: LEFT THUMB CARPAL METACARPAL ARTHROPLASTY;  Surgeon: Tami Ribas, MD;  Location: [facility];  Service: Orthopedics;  Laterality: Left;   ICD IMPLANT N/A 04/03/2020   Procedure: ICD IMPLANT;  Surgeon: Regan Lemming, MD;  Location: [facility] INVASIVE CV LAB;  Service: Cardiovascular;  Laterality: N/A;   IR ANGIO INTRA EXTRACRAN SEL COM CAROTID INNOMINATE BILAT MOD SED  01/05/2017   IR ANGIO INTRA EXTRACRAN SEL COM CAROTID INNOMINATE BILAT MOD SED  03/19/2019   IR ANGIO INTRA EXTRACRAN SEL COM CAROTID INNOMINATE BILAT MOD SED  06/04/2020   IR ANGIO INTRA EXTRACRAN SEL INTERNAL CAROTID UNI L MOD SED  04/15/2019   IR ANGIO VERTEBRAL SEL VERTEBRAL BILAT MOD SED  01/05/2017   IR ANGIO VERTEBRAL SEL VERTEBRAL BILAT MOD SED  03/19/2019   IR ANGIO VERTEBRAL SEL VERTEBRAL UNI L MOD SED  06/04/2020   IR ANGIOGRAM FOLLOW UP STUDY  04/15/2019   IR RADIOLOGIST EVAL & MGMT  12/30/2016   IR TRANSCATH/EMBOLIZ  04/15/2019   IR US GUIDE VASC ACCESS RIGHT  03/19/2019   IR US GUIDE VASC ACCESS RIGHT  06/04/2020   MALONEY DILATION N/A 09/20/2018   Procedure: Elease Hashimoto DILATION;  Surgeon: Corbin Ade, MD;  Location: [facility];  Service: Endoscopy;  Laterality: N/A;   MALONEY DILATION N/A 01/04/2023   Procedure: Elease Hashimoto DILATION;  Surgeon: Corbin Ade, MD;  Location: [facility];  Service: Endoscopy;  Laterality:  N/A;   RADIOLOGY WITH ANESTHESIA N/A 04/15/2019   Procedure: Sharman Crate;  Surgeon: Julieanne Cotton, MD;  Location: [facility] OR;  Service: Radiology;  Laterality: N/A;   REVERSE SHOULDER ARTHROPLASTY Left 03/21/2023   Procedure: LEFT REVERSE SHOULDER ARTHROPLASTY;  Surgeon: Cammy Copa, MD;  Location: [facility] OR;  Service: Orthopedics;  Laterality: Left;   RIGHT/LEFT HEART CATH AND CORONARY ANGIOGRAPHY N/A 08/19/2019   Procedure: RIGHT/LEFT HEART CATH AND CORONARY ANGIOGRAPHY;  Surgeon: Laurey Morale, MD;  Location: [facility] INVASIVE CV LAB;  Service: Cardiovascular;  Laterality: N/A;   TUBAL LIGATION  1987   VAGINAL HYSTERECTOMY  2009   Patient Active Problem List   Diagnosis Date Noted   Panic attacks 04/21/2023   Pulmonary nodule 1 cm or greater in diameter 04/21/2023   Arthritis of left shoulder region 04/02/2023   Biceps tendonitis on left 04/02/2023   OA (osteoarthritis) of shoulder 03/21/2023  S/P reverse total shoulder arthroplasty, left 03/21/2023   RUQ pain 06/29/2022   Pancreatic cyst 06/29/2022   Peripheral neuropathy 05/04/2022   Restless leg syndrome 05/04/2022   Hypertriglyceridemia 05/04/2022   Bilateral shoulder pain 05/04/2022   Encounter for general adult medical examination with abnormal findings 05/04/2022   H/O total hysterectomy 04/26/2022   Odynophagia 04/20/2022   Abdominal pain, epigastric 04/20/2022   Nausea without vomiting 06/16/2021   Allergy to alpha-gal 06/16/2021   Orthostatic hypotension 06/16/2021   Hypocortisolemia (HCC) 03/02/2021   Adrenal adenoma, left 03/02/2021   AKI (acute kidney injury) (HCC) 01/04/2021   Intractable nausea and vomiting 01/03/2021   Left-sided weakness 10/19/2020   Cough 02/11/2020   Near syncope 12/10/2019   Brain aneurysm 04/15/2019   Dysphagia 02/27/2018   Encounter for screening colonoscopy 02/27/2018   History of Clostridium difficile infection 02/27/2018   Chronic diarrhea 12/20/2017   Chronic combined systolic  and diastolic congestive heart failure (HCC) 06/20/2016   Hypokalemia due to excessive gastrointestinal loss of potassium    Acute CHF (congestive heart failure) (HCC) 05/16/2016   Acute on chronic systolic CHF (congestive heart failure) (HCC) 05/16/2016   Acute respiratory failure with hypoxia (HCC)    Thrombocytosis 03/26/2016   Cardiomyopathy, ischemic 03/25/2016   Chronic combined systolic and diastolic heart failure (HCC) 03/25/2016   Anxiety state 03/25/2016   Troponin level elevated 03/25/2016   Coronary artery disease involving native coronary artery of native heart 03/25/2016   Normocytic anemia 03/25/2016   SOB (shortness of breath) 03/24/2016   Lightheadedness 03/17/2016   Hypotension 03/17/2016   Tobacco abuse 03/12/2016   NSTEMI (non-ST elevated myocardial infarction) (HCC) 03/11/2016   Atypical chest pain    Essential hypertension 09/06/2015   Hyperlipidemia 09/06/2015   GERD without esophagitis 09/06/2015   Chest pain 09/06/2015    PCP: Christel Mormon, MD REFERRING PROVIDER: Lenice Pressman, MD  ONSET DATE: 03/21/23  REFERRING DIAG: L Reverse Total Shoulder  THERAPY DIAG:  Acute pain of left shoulder  Stiffness of left shoulder, not elsewhere classified  Other symptoms and signs involving the musculoskeletal system  Rationale for Evaluation and Treatment: Rehabilitation  SUBJECTIVE:   SUBJECTIVE STATEMENT: S: "I think I may have slept on it last night."   PERTINENT HISTORY: Pt reports L shoulder pain for ~7 years with worsening pain. This past year she reports limiting ability to cook, clean, and complete certain parts of dressing and bathing. On 03/21/23, pt s/p L Reverse Total Shoulder.   PRECAUTIONS: Shoulder  WEIGHT BEARING RESTRICTIONS: Yes >1lb  PAIN:  Are you having pain? Yes: NPRS scale: 5/10 Pain location: posterior deltoid Pain description: sore Aggravating factors: Cold, supine position Relieving factors: medication  FALLS: Has  patient fallen in last 6 months? No  PLOF: Independent  PATIENT GOALS: "To be able to fasten my own bra"  NEXT MD VISIT: 06/19/23  OBJECTIVE:   HAND DOMINANCE: Right  ADLs: Overall ADLs: Pt reports inability to clasp her bra, put her hair up, or wash her hair. Due to precautions and pain/ROM limits she is unable to lift and carry any items heavier than her cell phone.   FUNCTIONAL OUTCOME MEASURES: FOTO: 42.21 06/07/23: 55.53/100  UPPER EXTREMITY ROM:       Assessed in supine, er/IR adducted  Passive ROM Left eval Left 06/07/23  Shoulder flexion 99 134  Shoulder abduction 78 122  Shoulder internal rotation 90 90  Shoulder external rotation 24 63  (Blank rows = not tested)    UPPER  EXTREMITY MMT:     Assessed in seated, er/IR adducted  MMT Left eval Left 06/07/23  Shoulder flexion  4-/5  Shoulder abduction  4-/5  Shoulder internal rotation  3+/5  Shoulder external rotation  3+/5  (Blank rows = not tested)  SENSATION: Mild numbness and tingling noted intermittently in finger tips and elbow  EDEMA: no swelling noted  OBSERVATIONS: Moderate fascial restrictions    TODAY'S TREATMENT:                                                                                                                              DATE:   06/20/23 -A/ROM: seated, flexion, abduction, protraction, horizontal abduction, er/IR, x12 -x to v arms, x12 -goal post arms, x12 -Proximal shoulder strengthening: seated-paddles, criss cross, circles each direction, x10 each -Overhead lacing -Functional Reaching: 1#, 1st and 2nd shelf x10, 2#, 1st x10 and 2nd shelf x2 -Scapular Strengthening: green band, extension, retraction, rows, x12 -Shoulder Strengthening: red band, horizontal abduction, er, IR, yellow band, flexion, abduction, x10 each -UBE: level 2, 3' forwards and backwards  06/13/23 -manual therapy: myofascial release and trigger point applied to biceps, trapezius, deltoid, and scapular  region for improving pain and fascial restrictions, as well as ROM -P/ROM: supine-flexion, abduction, horizontal abduction, er, 5 reps -A/ROM: supine-protraction, flexion, er, horizontal abduction, 12 reps -Proximal shoulder strengthening: supine-paddles, criss cross, circles each direction, 10 reps -Shoulder stretches: flexion, IR behind back with horizontal towel, corner stretch, cross chest stretch, er at wall, 2x20" holds -Scapular theraband: extension, 10 reps  06/07/23 -Pulleys: flexion, abduction, x60" -A/ROM: seated, flexion, abduction, protraction, horizontal abduction, er/IR, x15 -x to v arms, x10 -goal post arms, x10 -scapular strengthening: green band, extension, retraction, rows, x12 - limited rows due to pain -Shoulder strengthening: yellow band, flexion, abduction, er, IR, horizontal abduction, x12  PATIENT EDUCATION: Education details: shoulder strengthening and scapular strengthening Person educated: Patient Education method: Explanation, Demonstration, and Handouts Education comprehension: verbalized understanding and returned demonstration  HOME EXERCISE PROGRAM: 12/2: Table Slides 12/9: Pendulums 12/16: AA/ROM 12/30: A/ROM  2/4: shoulder stretches 2/11: shoulder strengthening and scapular strengthening  GOALS: Goals reviewed with patient? Yes   SHORT TERM GOALS: Target date: 05/08/23  Pt will be provided with and educated on HEP to improve mobility in LUE required for use during ADL completion.   Goal status: IN PROGRESS  2.  Pt will increase LUE P/ROM by 30 degrees to improve ability to use LUE during dressing tasks with minimal compensatory techniques.   Goal status: IN PROGRESS  3.  Pt will increase LUE strength to 3+/5 to improve ability to reach for items at waist to chest height during bathing and grooming tasks.   Goal status: IN PROGRESS   LONG TERM GOALS: Target date: 06/09/23  Pt will decrease pain in LUE to 3/10 or less to improve ability  to sleep for 2+ consecutive hours without waking due to pain.   Goal status: IN PROGRESS  2.  Pt will decrease LUE fascial restrictions to min amounts or less to improve mobility required for functional reaching tasks.   Goal status: IN PROGRESS  3.  Pt will increase LUE A/ROM by 45 degrees to improve ability to use LUE when reaching overhead or behind back during dressing and bathing tasks.   Goal status: IN PROGRESS  4.  Pt will increase LUE strength to 4+/5 or greater to improve ability to use LUE when lifting or carrying items during meal preparation/housework/yardwork tasks.   Goal status: IN PROGRESS  5.  Pt will return to highest level of function using LUE as non-dominant during functional task completion.   Goal status: IN PROGRESS   ASSESSMENT:  CLINICAL IMPRESSION: This session, pt continuing to have incremental improvements with ROM and strength. She was able to add resistance band exercises and functional reaching with light weights. Pt was unable to reach into the 2nd shelf more than twice with the 2# weight. OT providing verbal and tactile cuing for positioning and technique throughout session.   PERFORMANCE DEFICITS: in functional skills including in functional skills including ADLs, IADLs, coordination, tone, ROM, strength, pain, fascial restrictions, muscle spasms, and UE functional use.   PLAN:  OT FREQUENCY: 2x/week  OT DURATION: 8 weeks  PLANNED INTERVENTIONS: 97168 OT Re-evaluation, 97535 self care/ADL training, 16109 therapeutic exercise, 97530 therapeutic activity, 97140 manual therapy, 97035 ultrasound, 97010 moist heat, 97032 electrical stimulation (manual), passive range of motion, energy conservation, coping strategies training, patient/family education, and DME and/or AE instructions  CONSULTED AND AGREED WITH PLAN OF CARE: Patient  PLAN FOR NEXT SESSION: Manual Therapy, P/ROM, A/ROM, proximal shoulder strengthening, functional reaching, shoulder  stretches, follow up on HEP   Trish Mage, OTR/L [phone] 06/20/2023, 12:25 PM

## 2023-06-20 NOTE — Patient Instructions (Signed)
1) (Home) Extension: Isometric / Bilateral Arm Retraction - Sitting ? ? ?Facing anchor, hold hands and elbow at shoulder height, with elbow bent.  Pull arms back to squeeze shoulder blades together. Repeat 10-15 times. 1-3 times/day.  ? ?2) (Clinic) Extension / Flexion (Assist) ? ? ?Face anchor, pull arms back, keeping elbow straight, and squeze shoulder blades together. ?Repeat 10-15 times. 1-3 times/day.  ? ?Copyright ? [employer]. All rights reserved.  ? ?3) (Home) Retraction: Row - Bilateral (Anchor) ? ? ?Facing anchor, arms reaching forward, pull hands toward stomach, keeping elbows bent and at your sides and pinching shoulder blades together. ?Repeat 10-15 times. 1-3 times/day.  ? ?Copyright ? [employer]. All rights reserved.  ? ? ? ? ?Theraband strengthening: Complete 10-15X, 1-2X/day ? ?1) Shoulder protraction ? ?Anchor band in doorway, stand with back to door. Push your hand forward as much as you can to bringing your shoulder blades forward on your rib cage. ? ? ? ? ? ?2) Shoulder horizontal abduction ? ?Standing with a theraband anchored at chest height, begin with arm straight and some tension in the band. Move your arm out to your side (keeping straight the whole time). Bring the affected arm back to midline. ? ? ? ? ?3) Shoulder Internal Rotation ? ?While holding an elastic band at your side with your elbow bent, start with your hand away from your stomach, then pull the band towards your stomach. Keep your elbow near your side the entire time. ? ? ? ? ?4) Shoulder External Rotation ? ?While holding an elastic band at your side with your elbow bent, start with your hand near your stomach and then pull the band away. Keep your elbow at your side the entire time. ? ? ? ? ?5) Shoulder flexion ? ?While standing with back to the door, holding Theraband at hand level, raise arm in front of you.  Keep elbow straight through entire movement.  ? ? ? ? ?6) Shoulder abduction ? ?While holding an elastic band at your side, draw  up your arm to the side keeping your elbow straight. ?  ? ?

## 2023-06-20 NOTE — Telephone Encounter (Signed)
I spoke to pt about path results.  I went over what to expect the day of surgery and pt was ok for me to send to scheduling.

## 2023-06-22 ENCOUNTER — Encounter (HOSPITAL_COMMUNITY): Payer: Medicare PPO | Admitting: Occupational Therapy

## 2023-06-23 ENCOUNTER — Ambulatory Visit: Payer: Medicare Other | Admitting: Internal Medicine

## 2023-06-27 ENCOUNTER — Encounter (HOSPITAL_COMMUNITY): Payer: Self-pay | Admitting: Occupational Therapy

## 2023-06-27 ENCOUNTER — Ambulatory Visit (HOSPITAL_COMMUNITY): Payer: Medicare Other | Admitting: Occupational Therapy

## 2023-06-27 DIAGNOSIS — M25612 Stiffness of left shoulder, not elsewhere classified: Secondary | ICD-10-CM

## 2023-06-27 DIAGNOSIS — R29898 Other symptoms and signs involving the musculoskeletal system: Secondary | ICD-10-CM

## 2023-06-27 DIAGNOSIS — M25512 Pain in left shoulder: Secondary | ICD-10-CM

## 2023-06-27 NOTE — Therapy (Unsigned)
 OUTPATIENT OCCUPATIONAL THERAPY ORTHO TREATMENT NOTE PROGRESS NOTE   Patient Name: Erin [name] MRN: [id number] DOB:August 24, 1960, 63 y.o., female Today's Date: 06/28/2023  Progress Note Reporting Period 04/09/24 to 06/27/23  See note below for Objective Data and Assessment of Progress/Goals.    END OF SESSION:  OT End of Session - 06/27/23 1145     Visit Number 10    Number of Visits 17    Date for OT Re-Evaluation 07/14/23    Authorization Type Medicare Part A and B    Progress Note Due on Visit 20    OT Start Time 1106    OT Stop Time 1145    OT Time Calculation (min) 39 min    Activity Tolerance Patient tolerated treatment well    Behavior During Therapy WFL for tasks assessed/performed             Past Medical History:  Diagnosis Date   AICD (automatic cardioverter/defibrillator) present    Allergic reaction to alpha-gal    Allergy 03/05/2022   Anemia    Anxiety state 03/25/2016   Arthritis    Basal cell carcinoma of forehead    Brain aneurysm    CHF (congestive heart failure) (HCC)    Coronary artery disease    a. 03/11/16 PCI with DES-->Prox/Mid Cx;  b. 03/14/16 PCI with DES x2-->RCA, EF 30-35%.   Depression 06/09/2010   Encounter for general adult medical examination with abnormal findings 05/04/2022   Essential hypertension    Hx   GERD (gastroesophageal reflux disease)    HFrEF (heart failure with reduced ejection fraction) (HCC)    a. 10/2016 Echo: EF 35-40%, Gr1 DD, mild focal basal septal hypertrophy, basal inflat, mid inflat, basal antlat AK. Mid infept/inf/antlat, apical lateral sev HK. Mod MR. mildly reduced RV fxn. Mild TR.   History of pneumonia    Hyperlipidemia    IBS (irritable bowel syndrome)    Ischemic cardiomyopathy    a. 10/2016 Echo: EF 35-40%, Gr1 DD.   Mitral regurgitation    Neuromuscular disorder (HCC)    NSTEMI (non-ST elevated myocardial infarction) (HCC) 03/10/2016   Pneumonia 03/2016   Shingles    Squamous cell cancer of  skin of nose    Thrombocytosis 03/26/2016   Tobacco abuse    Trichimoniasis    Wears dentures    Wears glasses    Past Surgical History:  Procedure Laterality Date   APPENDECTOMY     BICEPT TENODESIS Left 03/21/2023   Procedure: BICEPS TENODESIS;  Surgeon: Cammy Copa, MD;  Location: [facility] OR;  Service: Orthopedics;  Laterality: Left;   BIOPSY  09/20/2018   Procedure: BIOPSY;  Surgeon: Corbin Ade, MD;  [facility];  Service: Endoscopy;;  colon   BIOPSY  01/05/2021   Procedure: BIOPSY;  Surgeon: Rachael Fee, MD;  Location: [facility] ENDOSCOPY;  Service: Endoscopy;;   BIOPSY  04/27/2022   Procedure: BIOPSY;  Surgeon: Lanelle Bal, DO;  Location: [facility];  Service: Endoscopy;;   CARDIAC CATHETERIZATION N/A 03/11/2016   Procedure: Left Heart Cath and Coronary Angiography;  Surgeon: Marykay Lex, MD;  Location: [facility] INVASIVE CV LAB;  Service: Cardiovascular;  Laterality: N/A;   CARDIAC CATHETERIZATION N/A 03/11/2016   Procedure: Coronary Stent Intervention;  Surgeon: Marykay Lex, MD;  Location: [facility] INVASIVE CV LAB;  Service: Cardiovascular;  Laterality: N/A;   CARDIAC CATHETERIZATION N/A 03/14/2016   Procedure: Coronary Stent Intervention;  Surgeon: Peter M Swaziland, MD;  Location: [facility] INVASIVE CV  LAB;  Service: Cardiovascular;  Laterality: N/A;   CEREBRAL ANEURYSM REPAIR  04/2019   stent placed   CHOLECYSTECTOMY OPEN  1984   COLONOSCOPY WITH PROPOFOL N/A 09/20/2018   Procedure: COLONOSCOPY WITH PROPOFOL;  Surgeon: Corbin Ade, MD;  [facility]: [facility];  Service: Endoscopy;  Laterality: N/A;  10:30am   CORONARY ANGIOPLASTY WITH STENT PLACEMENT  03/14/2016   ESOPHAGOGASTRODUODENOSCOPY (EGD) WITH PROPOFOL N/A 09/20/2018   Procedure: ESOPHAGOGASTRODUODENOSCOPY (EGD) WITH PROPOFOL;  Surgeon: Corbin Ade, MD;  [facility];  Service: Endoscopy;  Laterality: N/A;   ESOPHAGOGASTRODUODENOSCOPY (EGD) WITH PROPOFOL N/A 01/05/2021   Procedure:  ESOPHAGOGASTRODUODENOSCOPY (EGD) WITH PROPOFOL;  Surgeon: Rachael Fee, MD;  Location: [facility] ENDOSCOPY;  Service: Endoscopy;  Laterality: N/A;   ESOPHAGOGASTRODUODENOSCOPY (EGD) WITH PROPOFOL N/A 04/27/2022   Procedure: ESOPHAGOGASTRODUODENOSCOPY (EGD) WITH PROPOFOL;  Surgeon: Lanelle Bal, DO;  [facility]: [facility];  Service: Endoscopy;  Laterality: N/A;  9:15am, asa 3/4, ASAP   ESOPHAGOGASTRODUODENOSCOPY (EGD) WITH PROPOFOL N/A 01/04/2023   Procedure: ESOPHAGOGASTRODUODENOSCOPY (EGD) WITH PROPOFOL;  Surgeon: Corbin Ade, MD;  [facility]: [facility];  Service: Endoscopy;  Laterality: N/A;  130pm, asa 3   FINGER ARTHROPLASTY Left 05/14/2013   Procedure: LEFT THUMB CARPAL METACARPAL ARTHROPLASTY;  Surgeon: Tami Ribas, MD;  Location: [facility];  Service: Orthopedics;  Laterality: Left;   ICD IMPLANT N/A 04/03/2020   Procedure: ICD IMPLANT;  Surgeon: Regan Lemming, MD;  Location: [facility] INVASIVE CV LAB;  Service: Cardiovascular;  Laterality: N/A;   IR ANGIO INTRA EXTRACRAN SEL COM CAROTID INNOMINATE BILAT MOD SED  01/05/2017   IR ANGIO INTRA EXTRACRAN SEL COM CAROTID INNOMINATE BILAT MOD SED  03/19/2019   IR ANGIO INTRA EXTRACRAN SEL COM CAROTID INNOMINATE BILAT MOD SED  06/04/2020   IR ANGIO INTRA EXTRACRAN SEL INTERNAL CAROTID UNI L MOD SED  04/15/2019   IR ANGIO VERTEBRAL SEL VERTEBRAL BILAT MOD SED  01/05/2017   IR ANGIO VERTEBRAL SEL VERTEBRAL BILAT MOD SED  03/19/2019   IR ANGIO VERTEBRAL SEL VERTEBRAL UNI L MOD SED  06/04/2020   IR ANGIOGRAM FOLLOW UP STUDY  04/15/2019   IR RADIOLOGIST EVAL & MGMT  12/30/2016   IR TRANSCATH/EMBOLIZ  04/15/2019   IR US GUIDE VASC ACCESS RIGHT  03/19/2019   IR US GUIDE VASC ACCESS RIGHT  06/04/2020   MALONEY DILATION N/A 09/20/2018   Procedure: Elease Hashimoto DILATION;  Surgeon: Corbin Ade, MD;  [facility];  Service: Endoscopy;  Laterality: N/A;   MALONEY DILATION N/A 01/04/2023   Procedure: Elease Hashimoto DILATION;   Surgeon: Corbin Ade, MD;  Location: [facility];  Service: Endoscopy;  Laterality: N/A;   RADIOLOGY WITH ANESTHESIA N/A 04/15/2019   Procedure: Sharman Crate;  Surgeon: Julieanne Cotton, MD;  Location: [facility] OR;  Service: Radiology;  Laterality: N/A;   REVERSE SHOULDER ARTHROPLASTY Left 03/21/2023   Procedure: LEFT REVERSE SHOULDER ARTHROPLASTY;  Surgeon: Cammy Copa, MD;  Location: [facility] OR;  Service: Orthopedics;  Laterality: Left;   RIGHT/LEFT HEART CATH AND CORONARY ANGIOGRAPHY N/A 08/19/2019   Procedure: RIGHT/LEFT HEART CATH AND CORONARY ANGIOGRAPHY;  Surgeon: Laurey Morale, MD;  Location: [facility] INVASIVE CV LAB;  Service: Cardiovascular;  Laterality: N/A;   TUBAL LIGATION  1987   VAGINAL HYSTERECTOMY  2009   Patient Active Problem List   Diagnosis Date Noted   Panic attacks 04/21/2023   Pulmonary nodule 1 cm or greater in diameter 04/21/2023   Arthritis of left shoulder region  04/02/2023   Biceps tendonitis on left 04/02/2023   OA (osteoarthritis) of shoulder 03/21/2023   S/P reverse total shoulder arthroplasty, left 03/21/2023   RUQ pain 06/29/2022   Pancreatic cyst 06/29/2022   Peripheral neuropathy 05/04/2022   Restless leg syndrome 05/04/2022   Hypertriglyceridemia 05/04/2022   Bilateral shoulder pain 05/04/2022   Encounter for general adult medical examination with abnormal findings 05/04/2022   H/O total hysterectomy 04/26/2022   Odynophagia 04/20/2022   Abdominal pain, epigastric 04/20/2022   Nausea without vomiting 06/16/2021   Allergy to alpha-gal 06/16/2021   Orthostatic hypotension 06/16/2021   Hypocortisolemia (HCC) 03/02/2021   Adrenal adenoma, left 03/02/2021   AKI (acute kidney injury) (HCC) 01/04/2021   Intractable nausea and vomiting 01/03/2021   Left-sided weakness 10/19/2020   Cough 02/11/2020   Near syncope 12/10/2019   Brain aneurysm 04/15/2019   Dysphagia 02/27/2018   Encounter for screening colonoscopy 02/27/2018   History of Clostridium  difficile infection 02/27/2018   Chronic diarrhea 12/20/2017   Chronic combined systolic and diastolic congestive heart failure (HCC) 06/20/2016   Hypokalemia due to excessive gastrointestinal loss of potassium    Acute CHF (congestive heart failure) (HCC) 05/16/2016   Acute on chronic systolic CHF (congestive heart failure) (HCC) 05/16/2016   Acute respiratory failure with hypoxia (HCC)    Thrombocytosis 03/26/2016   Cardiomyopathy, ischemic 03/25/2016   Chronic combined systolic and diastolic heart failure (HCC) 03/25/2016   Anxiety state 03/25/2016   Troponin level elevated 03/25/2016   Coronary artery disease involving native coronary artery of native heart 03/25/2016   Normocytic anemia 03/25/2016   SOB (shortness of breath) 03/24/2016   Lightheadedness 03/17/2016   Hypotension 03/17/2016   Tobacco abuse 03/12/2016   NSTEMI (non-ST elevated myocardial infarction) (HCC) 03/11/2016   Atypical chest pain    Essential hypertension 09/06/2015   Hyperlipidemia 09/06/2015   GERD without esophagitis 09/06/2015   Chest pain 09/06/2015    PCP: Christel Mormon, MD REFERRING PROVIDER: Lenice Pressman, MD  ONSET DATE: 03/21/23  REFERRING DIAG: L Reverse Total Shoulder  THERAPY DIAG:  Acute pain of left shoulder  Stiffness of left shoulder, not elsewhere classified  Other symptoms and signs involving the musculoskeletal system  Rationale for Evaluation and Treatment: Rehabilitation  SUBJECTIVE:   SUBJECTIVE STATEMENT: S: "I hurt my shoulder trying to stop my cat from jumping out of the window."   PERTINENT HISTORY: Pt reports L shoulder pain for ~7 years with worsening pain. This past year she reports limiting ability to cook, clean, and complete certain parts of dressing and bathing. On 03/21/23, pt s/p L Reverse Total Shoulder.   PRECAUTIONS: Shoulder  WEIGHT BEARING RESTRICTIONS: Yes >1lb  PAIN:  Are you having pain? Yes: NPRS scale: 5/10 Pain location: posterior  deltoid Pain description: sore Aggravating factors: Cold, supine position Relieving factors: medication  FALLS: Has patient fallen in last 6 months? No  PLOF: Independent  PATIENT GOALS: "To be able to fasten my own bra"  NEXT MD VISIT: 06/19/23  OBJECTIVE:   HAND DOMINANCE: Right  ADLs: Overall ADLs: Pt reports inability to clasp her bra, put her hair up, or wash her hair. Due to precautions and pain/ROM limits she is unable to lift and carry any items heavier than her cell phone.   FUNCTIONAL OUTCOME MEASURES: FOTO: 42.21 06/07/23: 55.53/100  UPPER EXTREMITY ROM:       Assessed in supine, er/IR adducted  Passive ROM Left eval Left 06/07/23  Shoulder flexion 99 134  Shoulder abduction 78 122  Shoulder internal rotation 90 90  Shoulder external rotation 24 63  (Blank rows = not tested)    UPPER EXTREMITY MMT:     Assessed in seated, er/IR adducted  MMT Left eval Left 06/07/23  Shoulder flexion  4-/5  Shoulder abduction  4-/5  Shoulder internal rotation  3+/5  Shoulder external rotation  3+/5  (Blank rows = not tested)  SENSATION: Mild numbness and tingling noted intermittently in finger tips and elbow  EDEMA: no swelling noted  OBSERVATIONS: Moderate fascial restrictions    TODAY'S TREATMENT:                                                                                                                              DATE:   06/27/23 -manual therapy: myofascial release and trigger point applied to biceps, trapezius, deltoid, and scapular region for improving pain and fascial restrictions, as well as ROM -AA/ROM: supine-protraction, flexion, er, horizontal abduction, x12 -[facility]: flexion, abduction, x12 -Pendulums: 2x30"  06/20/23 -A/ROM: seated, flexion, abduction, protraction, horizontal abduction, er/IR, x12 -x to v arms, x12 -goal post arms, x12 -Proximal shoulder strengthening: seated-paddles, criss cross, circles each direction, x10  each -Overhead lacing -Functional Reaching: 1#, 1st and 2nd shelf x10, 2#, 1st x10 and 2nd shelf x2 -Scapular Strengthening: green band, extension, retraction, rows, x12 -Shoulder Strengthening: red band, horizontal abduction, er, IR, yellow band, flexion, abduction, x10 each -UBE: level 2, 3' forwards and backwards  06/13/23 -manual therapy: myofascial release and trigger point applied to biceps, trapezius, deltoid, and scapular region for improving pain and fascial restrictions, as well as ROM -P/ROM: supine-flexion, abduction, horizontal abduction, er, 5 reps -A/ROM: supine-protraction, flexion, er, horizontal abduction, 12 reps -Proximal shoulder strengthening: supine-paddles, criss cross, circles each direction, 10 reps -Shoulder stretches: flexion, IR behind back with horizontal towel, corner stretch, cross chest stretch, er at wall, 2x20" holds -Scapular theraband: extension, 10 reps   PATIENT EDUCATION: Education details: Continue HEP Person educated: Patient Education method: Explanation, Demonstration, and Handouts Education comprehension: verbalized understanding and returned demonstration  HOME EXERCISE PROGRAM: 12/2: Table Slides 12/9: Pendulums 12/16: AA/ROM 12/30: A/ROM  2/4: shoulder stretches 2/11: shoulder strengthening and scapular strengthening  GOALS: Goals reviewed with patient? Yes   SHORT TERM GOALS: Target date: 05/08/23  Pt will be provided with and educated on HEP to improve mobility in LUE required for use during ADL completion.   Goal status: IN PROGRESS  2.  Pt will increase LUE P/ROM by 30 degrees to improve ability to use LUE during dressing tasks with minimal compensatory techniques.   Goal status: IN PROGRESS  3.  Pt will increase LUE strength to 3+/5 to improve ability to reach for items at waist to chest height during bathing and grooming tasks.   Goal status: IN PROGRESS   LONG TERM GOALS: Target date: 06/09/23  Pt will decrease  pain in LUE to 3/10 or less to improve ability to sleep for 2+ consecutive hours without  waking due to pain.   Goal status: IN PROGRESS  2.  Pt will decrease LUE fascial restrictions to min amounts or less to improve mobility required for functional reaching tasks.   Goal status: IN PROGRESS  3.  Pt will increase LUE A/ROM by 45 degrees to improve ability to use LUE when reaching overhead or behind back during dressing and bathing tasks.   Goal status: IN PROGRESS  4.  Pt will increase LUE strength to 4+/5 or greater to improve ability to use LUE when lifting or carrying items during meal preparation/housework/yardwork tasks.   Goal status: IN PROGRESS  5.  Pt will return to highest level of function using LUE as non-dominant during functional task completion.   Goal status: IN PROGRESS   ASSESSMENT:  CLINICAL IMPRESSION: This session, pt having significant pain and limited ROM due to attempting to grab and catch her cat the prior day. She presented with severe fascial restrictions along the biceps and anterior shoulder girdle, addressed with manual therapy. Then, due to severe pain and ROM deficits pt needed to return to AA/ROM in order to stretch out her LUE and achieve 75% of full ROM, otherwise she was unable to achieve 50% of full ROM actively. OT educating pt to return to movement only exercises at home until pain has improved and to not add or do any resistance or lifting based tasks or exercises. Verbal and tactile cuing provided for positioning and technique.   PERFORMANCE DEFICITS: in functional skills including in functional skills including ADLs, IADLs, coordination, tone, ROM, strength, pain, fascial restrictions, muscle spasms, and UE functional use.   PLAN:  OT FREQUENCY: 2x/week  OT DURATION: 8 weeks  PLANNED INTERVENTIONS: 97168 OT Re-evaluation, 97535 self care/ADL training, 16109 therapeutic exercise, 97530 therapeutic activity, 97140 manual therapy, 97035  ultrasound, 97010 moist heat, 97032 electrical stimulation (manual), passive range of motion, energy conservation, coping strategies training, patient/family education, and DME and/or AE instructions  CONSULTED AND AGREED WITH PLAN OF CARE: Patient  PLAN FOR NEXT SESSION: Manual Therapy, P/ROM, A/ROM, proximal shoulder strengthening, functional reaching, shoulder stretches, follow up on HEP   Trish Mage, OTR/L [phone] 06/28/2023, 8:24 AM

## 2023-06-29 ENCOUNTER — Encounter: Payer: Self-pay | Admitting: Cardiology

## 2023-06-29 ENCOUNTER — Encounter (HOSPITAL_COMMUNITY): Payer: Medicare PPO | Admitting: Occupational Therapy

## 2023-06-29 NOTE — Progress Notes (Signed)
 Unable to reach pt,but left voicemail Instructing patient to not take Jardiance after today 2/20. Last dose of Aspirin prior to surgery should be 2/21 and last dose of Plavix should have been 2/18 per Dr. Gailen Shelter instructions. I told her that someone would call her with the rest of her pre op instructions tomorrow.

## 2023-06-29 NOTE — Progress Notes (Signed)
 PERIOPERATIVE PRESCRIPTION FOR IMPLANTED CARDIAC DEVICE PROGRAMMING  Patient Information: Name:  Erin [name]  DOB:  October 29, 1960  MRN:  [id number]    Planned Procedure:  ROBOTIC ASSISTED NAVIGATIONAL BRONCHOSCOPY  Surgeon:  Harle Battiest  Date of Procedure:  07/03/2023  Cautery will be used.  Position during surgery:  supine   Please send documentation back to:  [facility] (Fax # [phone])  Device Information:  Clinic EP Physician:  Loman Brooklyn, MD   Device Type:  Defibrillator Manufacturer and Phone #:  [employer]: [phone] Pacemaker Dependent?:  No. Date of Last Device Check:  05/26/2023 Normal Device Function?:  Yes.    Electrophysiologist's Recommendations:  Have magnet available. Provide continuous ECG monitoring when magnet is used or reprogramming is to be performed.  Procedure may interfere with device function.  Magnet should be placed over device during procedure.  Per Device Clinic Standing Orders, Lenor Coffin, RN  3:41 PM 06/29/2023

## 2023-06-29 NOTE — Progress Notes (Signed)
 No ICM remote transmission received for 06/26/2023 and next ICM transmission scheduled for 07/10/2023.

## 2023-06-29 NOTE — Progress Notes (Incomplete)
SDW CALL  Patient was given pre-op instructions over the phone. The opportunity was given for the patient to ask questions. No further questions asked. Patient verbalized understanding of instructions given.   PCP -  Cardiologist -   PPM/ICD -  Device Orders -  Rep Notified -   Chest x-ray -  EKG -  Stress Test -  ECHO -  Cardiac Cath -   Sleep Study -  CPAP -   Fasting Blood Sugar -  Checks Blood Sugar _____ times a day  Blood Thinner Instructions: Aspirin Instructions:  ERAS Protcol -no PRE-SURGERY Ensure or G2-   COVID TEST- na   Anesthesia review:   Patient denies shortness of breath, fever, cough and chest pain over the phone call    Surgical Instructions    Your procedure is scheduled on Monday February 24.  Report to [facility] Main Entrance "A" at 0530 A.M., then check in with the Admitting office.  Call this number if you have problems the morning of surgery:  [phone]    Remember:  Do not eat or drink anything after midnight the night before your surgery    Take these medicines the morning of surgery with A SIP OF WATER:   STOP ASPIRIN 2 DAYS PRIOR TO SURGERY. LAST DOSE WILL BE 2/21.  STOP PLAVIX 5 DAYS PRIOR TO SURGERY. LAST DOSE WILL BE 2/18.  HOLD JARDIANCE 72 HOURS PRIOR TO SURGERY. LAST DOSE WILL BE 2/20  As of today, STOP taking any Aleve, Naproxen, Ibuprofen, Motrin, Advil, Goody's, BC's, all herbal medications, fish oil, and all vitamins.  [facility] is not responsible for any belongings or valuables. .   Do NOT Smoke (Tobacco/Vaping)  24 hours prior to your procedure  If you use a CPAP at night, you may bring your mask for your overnight stay.   Contacts, glasses, hearing aids, dentures or partials may not be worn into surgery, please bring cases for these belongings   Patients discharged the day of surgery will not be allowed to drive home, and someone needs to stay with them for 24 hours.   SURGICAL WAITING ROOM  VISITATION You may have 1 visitor in the pre-op area at a time determined by the pre-op nurse. (Visitor may not switch out) Patients having surgery or a procedure in a hospital may have two support people in the waiting room. Children under the age of 18 must have an adult with them who is not the patient. They may stay in the waiting area during the procedure and may switch out with other visitors. If the patient needs to stay at the hospital during part of their recovery, the visitor guidelines for inpatient rooms apply.  Please refer to the [facility] website for the visitor guidelines for Inpatients (after your surgery is over and you are in a regular room).     Special instructions:    Oral Hygiene is also important to reduce your risk of infection.  Remember - BRUSH YOUR TEETH THE MORNING OF SURGERY WITH YOUR REGULAR TOOTHPASTE   Day of Surgery:  Take a shower the day of or night before with antibacterial soap. Wear Clean/Comfortable clothing the morning of surgery Do not apply any deodorants/lotions.   Do not wear jewelry or makeup Do not wear lotions, powders, perfumes/colognes, or deodorant. Do not shave 48 hours prior to surgery.  Men may shave face and neck. Do not bring valuables to the hospital. Do not wear nail polish, gel polish, artificial nails, or  any other type of covering on natural nails (fingers and toes) If you have artificial nails or gel coating that need to be removed by a nail salon, please have this removed prior to surgery. Artificial nails or gel coating may interfere with anesthesia's ability to adequately monitor your vital signs. Remember to brush your teeth WITH YOUR REGULAR TOOTHPASTE.

## 2023-06-30 ENCOUNTER — Encounter (HOSPITAL_COMMUNITY): Payer: Self-pay | Admitting: Emergency Medicine

## 2023-06-30 ENCOUNTER — Other Ambulatory Visit: Payer: Self-pay

## 2023-07-03 ENCOUNTER — Ambulatory Visit (HOSPITAL_BASED_OUTPATIENT_CLINIC_OR_DEPARTMENT_OTHER): Payer: Medicare Other | Admitting: Anesthesiology

## 2023-07-03 ENCOUNTER — Ambulatory Visit (HOSPITAL_COMMUNITY): Payer: Medicare Other | Admitting: Anesthesiology

## 2023-07-03 ENCOUNTER — Other Ambulatory Visit: Payer: Self-pay | Admitting: Surgical

## 2023-07-03 ENCOUNTER — Encounter (HOSPITAL_COMMUNITY): Payer: Self-pay | Admitting: Emergency Medicine

## 2023-07-03 ENCOUNTER — Ambulatory Visit (HOSPITAL_COMMUNITY): Payer: Medicare Other

## 2023-07-03 ENCOUNTER — Ambulatory Visit (HOSPITAL_COMMUNITY)
Admission: RE | Admit: 2023-07-03 | Discharge: 2023-07-03 | Disposition: A | Discharge status: Home / Self Care | Payer: Medicare Other | Attending: Emergency Medicine | Admitting: Emergency Medicine

## 2023-07-03 ENCOUNTER — Encounter (HOSPITAL_COMMUNITY)
Admission: RE | Disposition: A | Discharge status: Home / Self Care | Payer: Self-pay | Source: Home / Self Care | Attending: Emergency Medicine

## 2023-07-03 DIAGNOSIS — E785 Hyperlipidemia, unspecified: Secondary | ICD-10-CM | POA: Insufficient documentation

## 2023-07-03 DIAGNOSIS — Z85828 Personal history of other malignant neoplasm of skin: Secondary | ICD-10-CM | POA: Insufficient documentation

## 2023-07-03 DIAGNOSIS — K219 Gastro-esophageal reflux disease without esophagitis: Secondary | ICD-10-CM | POA: Insufficient documentation

## 2023-07-03 DIAGNOSIS — C3412 Malignant neoplasm of upper lobe, left bronchus or lung: Secondary | ICD-10-CM | POA: Insufficient documentation

## 2023-07-03 DIAGNOSIS — Z95810 Presence of automatic (implantable) cardiac defibrillator: Secondary | ICD-10-CM | POA: Insufficient documentation

## 2023-07-03 DIAGNOSIS — I252 Old myocardial infarction: Secondary | ICD-10-CM | POA: Insufficient documentation

## 2023-07-03 DIAGNOSIS — I110 Hypertensive heart disease with heart failure: Secondary | ICD-10-CM

## 2023-07-03 DIAGNOSIS — Z800 Family history of malignant neoplasm of digestive organs: Secondary | ICD-10-CM | POA: Insufficient documentation

## 2023-07-03 DIAGNOSIS — F32A Depression, unspecified: Secondary | ICD-10-CM | POA: Insufficient documentation

## 2023-07-03 DIAGNOSIS — I5022 Chronic systolic (congestive) heart failure: Secondary | ICD-10-CM | POA: Diagnosis not present

## 2023-07-03 DIAGNOSIS — Z7902 Long term (current) use of antithrombotics/antiplatelets: Secondary | ICD-10-CM | POA: Diagnosis not present

## 2023-07-03 DIAGNOSIS — M1990 Unspecified osteoarthritis, unspecified site: Secondary | ICD-10-CM | POA: Diagnosis not present

## 2023-07-03 DIAGNOSIS — I255 Ischemic cardiomyopathy: Secondary | ICD-10-CM | POA: Diagnosis not present

## 2023-07-03 DIAGNOSIS — Z7982 Long term (current) use of aspirin: Secondary | ICD-10-CM | POA: Insufficient documentation

## 2023-07-03 DIAGNOSIS — F17210 Nicotine dependence, cigarettes, uncomplicated: Secondary | ICD-10-CM | POA: Insufficient documentation

## 2023-07-03 DIAGNOSIS — Z8249 Family history of ischemic heart disease and other diseases of the circulatory system: Secondary | ICD-10-CM | POA: Diagnosis not present

## 2023-07-03 DIAGNOSIS — R911 Solitary pulmonary nodule: Secondary | ICD-10-CM | POA: Diagnosis present

## 2023-07-03 DIAGNOSIS — I2510 Atherosclerotic heart disease of native coronary artery without angina pectoris: Secondary | ICD-10-CM | POA: Diagnosis not present

## 2023-07-03 DIAGNOSIS — Z803 Family history of malignant neoplasm of breast: Secondary | ICD-10-CM | POA: Diagnosis not present

## 2023-07-03 DIAGNOSIS — J449 Chronic obstructive pulmonary disease, unspecified: Secondary | ICD-10-CM | POA: Insufficient documentation

## 2023-07-03 DIAGNOSIS — I509 Heart failure, unspecified: Secondary | ICD-10-CM | POA: Diagnosis not present

## 2023-07-03 DIAGNOSIS — Z955 Presence of coronary angioplasty implant and graft: Secondary | ICD-10-CM | POA: Insufficient documentation

## 2023-07-03 HISTORY — PX: BRONCHIAL WASHINGS: SHX5105

## 2023-07-03 HISTORY — PX: BRONCHIAL NEEDLE ASPIRATION BIOPSY: SHX5106

## 2023-07-03 HISTORY — PX: BRONCHIAL BRUSHINGS: SHX5108

## 2023-07-03 HISTORY — PX: BRONCHIAL BIOPSY: SHX5109

## 2023-07-03 HISTORY — PX: FIDUCIAL MARKER PLACEMENT: SHX6858

## 2023-07-03 SURGERY — BRONCHOSCOPY, WITH BIOPSY USING ELECTROMAGNETIC NAVIGATION
Anesthesia: General

## 2023-07-03 MED ORDER — PROPOFOL 10 MG/ML IV BOLUS
INTRAVENOUS | Status: DC | PRN
Start: 2023-07-03 — End: 2023-07-03
  Administered 2023-07-03: 100 mg via INTRAVENOUS

## 2023-07-03 MED ORDER — ROCURONIUM BROMIDE 100 MG/10ML IV SOLN
INTRAVENOUS | Status: DC | PRN
Start: 2023-07-03 — End: 2023-07-03
  Administered 2023-07-03: 40 mg via INTRAVENOUS

## 2023-07-03 MED ORDER — LIDOCAINE HCL (PF) 2 % IJ SOLN
INTRAMUSCULAR | Status: DC | PRN
  Administered 2023-07-03: 60 mg via INTRADERMAL

## 2023-07-03 MED ORDER — ACETAMINOPHEN 10 MG/ML IV SOLN: 700.0000 mg | Freq: Once | INTRAVENOUS | Status: DC | PRN

## 2023-07-03 MED ORDER — LACTATED RINGERS IV SOLN
INTRAVENOUS | Status: DC | PRN
Start: 2023-07-03 — End: 2023-07-03

## 2023-07-03 MED ORDER — ONDANSETRON HCL 4 MG/2ML IJ SOLN
INTRAMUSCULAR | Status: DC | PRN
Start: 2023-07-03 — End: 2023-07-03
  Administered 2023-07-03: 4 mg via INTRAVENOUS

## 2023-07-03 MED ORDER — METOPROLOL SUCCINATE ER 25 MG PO TB24: 12.5000 mg | ORAL_TABLET | Freq: Every day | ORAL | Status: DC

## 2023-07-03 MED ORDER — FENTANYL CITRATE (PF) 100 MCG/2ML IJ SOLN: 25.0000 ug | INTRAMUSCULAR | Status: DC | PRN

## 2023-07-03 MED ORDER — AMISULPRIDE (ANTIEMETIC) 5 MG/2ML IV SOLN
INTRAVENOUS | Status: AC
  Filled 2023-07-03: qty 4

## 2023-07-03 MED ORDER — PROPOFOL 500 MG/50ML IV EMUL
INTRAVENOUS | Status: DC | PRN
  Administered 2023-07-03: 100 ug/kg/min via INTRAVENOUS

## 2023-07-03 MED ORDER — SUGAMMADEX SODIUM 200 MG/2ML IV SOLN
INTRAVENOUS | Status: DC | PRN
Start: 2023-07-03 — End: 2023-07-03
  Administered 2023-07-03: 200 mg via INTRAVENOUS

## 2023-07-03 MED ORDER — OXYCODONE HCL 5 MG PO TABS: 5.0000 mg | ORAL_TABLET | Freq: Once | ORAL | Status: DC | PRN

## 2023-07-03 MED ORDER — ASPIRIN EC 81 MG PO TBEC: 81.0000 mg | DELAYED_RELEASE_TABLET | Freq: Every evening | ORAL | Status: AC

## 2023-07-03 MED ORDER — OXYCODONE HCL 5 MG/5ML PO SOLN: 5.0000 mg | Freq: Once | ORAL | Status: DC | PRN

## 2023-07-03 MED ORDER — AMISULPRIDE (ANTIEMETIC) 5 MG/2ML IV SOLN
10.0000 mg | Freq: Once | INTRAVENOUS | Status: AC | PRN
  Administered 2023-07-03: 10 mg via INTRAVENOUS

## 2023-07-03 MED ORDER — ESMOLOL HCL 100 MG/10ML IV SOLN
INTRAVENOUS | Status: DC | PRN
  Administered 2023-07-03: 20 mg via INTRAVENOUS

## 2023-07-03 MED ORDER — CHLORHEXIDINE GLUCONATE 0.12 % MT SOLN
15.0000 mL | Freq: Once | OROMUCOSAL | Status: AC
  Administered 2023-07-03: 15 mL via OROMUCOSAL

## 2023-07-03 MED ORDER — DEXAMETHASONE SODIUM PHOSPHATE 10 MG/ML IJ SOLN
INTRAMUSCULAR | Status: DC | PRN
Start: 2023-07-03 — End: 2023-07-03
  Administered 2023-07-03: 5 mg via INTRAVENOUS

## 2023-07-03 MED ORDER — CLOPIDOGREL BISULFATE 75 MG PO TABS
75.0000 mg | ORAL_TABLET | Freq: Every day | ORAL | Status: DC
Start: 2023-07-03 — End: 2023-12-15

## 2023-07-03 SURGICAL SUPPLY — 1 items: Super Lock Fiducial Marker IMPLANT

## 2023-07-03 NOTE — Anesthesia Procedure Notes (Signed)
 Procedure Name: Intubation Date/Time: 07/03/2023 7:43 AM  Performed by: Venia Carbon, CRNAPre-anesthesia Checklist: Patient identified, Emergency Drugs available, Suction available, Patient being monitored and Timeout performed Patient Re-evaluated:Patient Re-evaluated prior to induction Oxygen Delivery Method: Circle system utilized Preoxygenation: Pre-oxygenation with 100% oxygen Induction Type: IV induction Ventilation: Mask ventilation without difficulty Laryngoscope Size: Mac and 3 Grade View: Grade I Tube size: 8.5 mm Number of attempts: 1 Airway Equipment and Method: Patient positioned with wedge pillow and Stylet Placement Confirmation: ETT inserted through vocal cords under direct vision, positive ETCO2, breath sounds checked- equal and bilateral and CO2 detector Secured at: 22 cm Tube secured with: Tape

## 2023-07-03 NOTE — Anesthesia Postprocedure Evaluation (Signed)
 Anesthesia Post Note  Patient: Erin [name]  Procedure(s) Performed: ROBOTIC ASSISTED NAVIGATIONAL BRONCHOSCOPY BRONCHIAL BIOPSIES BRONCHIAL BRUSHINGS BRONCHIAL NEEDLE ASPIRATION BIOPSIES BRONCHIAL WASHINGS FIDUCIAL MARKER PLACEMENT     Patient location during evaluation: PACU Anesthesia Type: General Level of consciousness: awake Pain management: pain level controlled Vital Signs Assessment: post-procedure vital signs reviewed and stable Respiratory status: spontaneous breathing, nonlabored ventilation and respiratory function stable Cardiovascular status: blood pressure returned to baseline and stable Postop Assessment: no apparent nausea or vomiting Anesthetic complications: no   No notable events documented.  Last Vitals:  Vitals:   07/03/23 0900 07/03/23 0915  BP: 98/70 111/66  Pulse: 67 72  Resp: 11 18  Temp:    SpO2: 98% 98%    Last Pain:  Vitals:   07/03/23 0915  TempSrc:   PainSc: 0-No pain                 Hodge Stachnik P Tiron Suski

## 2023-07-03 NOTE — Discharge Instructions (Addendum)
 Flexible Bronchoscopy, Care After This sheet gives you information about how to care for yourself after your test. Your doctor may also give you more specific instructions. If you have problems or questions, contact your doctor. Follow these instructions at home: Eating and drinking Do not eat or drink anything (not even water) for 2 hours after your test, or until your numbing medicine (local anesthetic) wears off. When your numbness is gone and your cough and gag reflexes have come back, you may: Eat only soft foods. Slowly drink liquids. When you get home after the test, go back to your normal diet. Driving Do not drive for 24 hours if you were given a medicine to help you relax (sedative). Do not drive or use heavy machinery while taking prescription pain medicine. General instructions  Take over-the-counter and prescription medicines only as told by your doctor. Return to your normal activities as told. Ask what activities are safe for you. Do not use any products that have nicotine or tobacco in them. This includes cigarettes and e-cigarettes. If you need help quitting, ask your doctor. Keep all follow-up visits as told by your doctor. This is important. It is very important if you had a tissue sample (biopsy) taken. Get help right away if: You have shortness of breath that gets worse. You get light-headed. You feel like you are going to pass out (faint). You have chest pain. You cough up: More than a little blood. More blood than before. Summary Do not eat or drink anything (not even water) for 2 hours after your test, or until your numbing medicine wears off. Do not use cigarettes. Do not use e-cigarettes. Get help right away if you have chest pain.  Please call our office for any questions or concerns.  [phone]. Okay to restart your Plavix and aspirin on 07/04/2023  This information is not intended to replace advice given to you by your health care provider. Make sure  you discuss any questions you have with your health care provider. Document Released: 02/20/2009 Document Revised: 04/07/2017 Document Reviewed: 05/13/2016 Elsevier Patient Education  2020 [employer].

## 2023-07-03 NOTE — Anesthesia Preprocedure Evaluation (Addendum)
 Anesthesia Evaluation  Patient identified by MRN, date of birth, ID band Patient awake    Reviewed: Allergy & Precautions, NPO status , Patient's Chart, lab work & pertinent test results  Airway Mallampati: III  TM Distance: >3 FB Neck ROM: Full    Dental  (+) Missing, Edentulous Upper   Pulmonary Current Smoker and Patient abstained from smoking.   Pulmonary exam normal        Cardiovascular hypertension, Pt. on medications and Pt. on home beta blockers + CAD, + Past MI, + Cardiac Stents and +CHF  Normal cardiovascular exam+ Cardiac Defibrillator   ECHO:  1. Left ventricular ejection fraction, by estimation, is 30 to 35%. The  left ventricle has moderately decreased function. The left ventricle  demonstrates regional wall motion abnormalities with basal to mid  inferolateral and anterolateral akinesis.  There is mild concentric left ventricular hypertrophy. Left ventricular  diastolic parameters are consistent with Grade I diastolic dysfunction  (impaired relaxation).   2. Right ventricular systolic function is normal. The right ventricular  size is normal. There is normal pulmonary artery systolic pressure. The  estimated right ventricular systolic pressure is 28.4 mmHg.   3. The mitral valve is normal in structure. Mild mitral valve  regurgitation. No evidence of mitral stenosis.   4. The aortic valve is tricuspid. Aortic valve regurgitation is not  visualized. No aortic stenosis is present.   5. The inferior vena cava is normal in size with greater than 50%  respiratory variability, suggesting right atrial pressure of 3 mmHg.     Neuro/Psych  PSYCHIATRIC DISORDERS Anxiety Depression     Neuromuscular disease    GI/Hepatic Neg liver ROS,GERD  Medicated and Controlled,,  Endo/Other  negative endocrine ROS    Renal/GU Renal disease     Musculoskeletal  (+) Arthritis ,    Abdominal   Peds  Hematology  (+)  Blood dyscrasia (Plavix)   Anesthesia Other Findings LEFT UPPER LOBE NODULE  Reproductive/Obstetrics                              Anesthesia Physical Anesthesia Plan  ASA: 3  Anesthesia Plan: General   Post-op Pain Management:    Induction: Intravenous  PONV Risk Score and Plan: 2 and Ondansetron, Dexamethasone, Midazolam and Treatment may vary due to age or medical condition  Airway Management Planned: Oral ETT  Additional Equipment:   Intra-op Plan:   Post-operative Plan: Extubation in OR  Informed Consent: I have reviewed the patients History and Physical, chart, labs and discussed the procedure including the risks, benefits and alternatives for the proposed anesthesia with the patient or authorized representative who has indicated his/her understanding and acceptance.     Dental advisory given  Plan Discussed with: CRNA  Anesthesia Plan Comments:         Anesthesia Quick Evaluation

## 2023-07-03 NOTE — Transfer of Care (Signed)
 Immediate Anesthesia Transfer of Care Note  Patient: Erin [name]  Procedure(s) Performed: ROBOTIC ASSISTED NAVIGATIONAL BRONCHOSCOPY BRONCHIAL BIOPSIES BRONCHIAL BRUSHINGS BRONCHIAL NEEDLE ASPIRATION BIOPSIES BRONCHIAL WASHINGS FIDUCIAL MARKER PLACEMENT  Patient Location: PACU  Anesthesia Type:General  Level of Consciousness: awake, alert , oriented, and patient cooperative  Airway & Oxygen Therapy: Patient Spontanous Breathing and Patient connected to face mask oxygen  Post-op Assessment: Report given to RN, Post -op Vital signs reviewed and stable, Patient moving all extremities, Patient moving all extremities X 4, and Patient able to stick tongue midline  Post vital signs: Reviewed and stable  Last Vitals:  Vitals Value Taken Time  BP 109/76 07/03/23 0845  Temp 36.9 C 07/03/23 0845  Pulse 69 07/03/23 0849  Resp 16 07/03/23 0849  SpO2 100 % 07/03/23 0849  Vitals shown include unfiled device data.  Last Pain:  Vitals:   07/03/23 0614  TempSrc:   PainSc: 0-No pain         Complications: No notable events documented.

## 2023-07-03 NOTE — Op Note (Signed)
 Video Bronchoscopy with Robotic Assisted Bronchoscopic Navigation   Date of Operation: 07/03/2023   Pre-op Diagnosis: Left upper lobe groundglass nodule  Post-op Diagnosis: Left upper lobe groundglass nodule  Surgeon: Levy Pupa  Assistants: None  Anesthesia: General endotracheal anesthesia  Operation: Flexible video fiberoptic bronchoscopy with robotic assistance and biopsies.  Estimated Blood Loss: Minimal  Complications: None  Indications and History: Erin [name] is a 63 y.o. female with history of active tobacco use, COPD, CAD with cardiomyopathy and AICD in place.  She has a slowly evolving somewhat cystic groundglass left upper lobe pulmonary nodule concerning for possible lung malignancy.  Recommendation made to achieve tissue diagnosis and to obtain culture data via robotic assisted navigational bronchoscopy.  The risks, benefits, complications, treatment options and expected outcomes were discussed with the patient.  The possibilities of pneumothorax, pneumonia, reaction to medication, pulmonary aspiration, perforation of a viscus, bleeding, failure to diagnose a condition and creating a complication requiring transfusion or operation were discussed with the patient who freely signed the consent.    Description of Procedure: The patient was seen in the Preoperative Area, was examined and was deemed appropriate to proceed.  The patient was taken to [facility] endoscopy room 3, identified as Erin [name] and the procedure verified as Flexible Video Fiberoptic Bronchoscopy.  A Time Out was held and the above information confirmed.   Prior to the date of the procedure a high-resolution CT scan of the chest was performed. Utilizing ION software program a virtual tracheobronchial tree was generated to allow the creation of distinct navigation pathways to the patient's parenchymal abnormalities. After being taken to the operating room general anesthesia was initiated and the patient  was  orally intubated. The video fiberoptic bronchoscope was introduced via the endotracheal tube and a general inspection was performed which showed normal right and left lung anatomy.  The left upper lobe airways were somewhat narrowed, inflamed and friable.  There was some bleeding with gentle suctioning.  There was no discrete endobronchial lesion.  Aspiration of the bilateral mainstems was completed to remove any remaining secretions. Robotic catheter inserted into patient's endotracheal tube.   Target #1 left upper lobe groundglass pulmonary nodule: The distinct navigation pathways prepared prior to this procedure were then utilized to navigate to patient's lesion identified on CT scan. The robotic catheter was secured into place and the vision probe was withdrawn.  Lesion location was approximated using fluoroscopy.  Local registration and targeting was performed using Cios three-dimensional imaging. Under fluoroscopic guidance transbronchial brushings, transbronchial needle biopsies, and transbronchial forceps biopsies were performed to be sent for cytology and pathology.  Brush in lesion was confirmed using Cios.  Under fluoroscopic guidance a single fiducial marker was placed adjacent to the nodule.  A bronchioalveolar lavage was performed in the left upper lobe and sent for microbiology.  At the end of the procedure a general airway inspection was performed and there was no evidence of active bleeding. The bronchoscope was removed.  The patient tolerated the procedure well. There was no significant blood loss and there were no obvious complications. A post-procedural chest x-ray is pending.  Samples Target #1: 1. Transbronchial brushings from left upper lobe nodule 2. Transbronchial Wang needle biopsies from left upper lobe nodule 3. Transbronchial forceps biopsies from left upper lobe nodule 4. Bronchoalveolar lavage from left upper lobe    Plans:  The patient will be discharged from the PACU  to home when recovered from anesthesia and after chest x-ray is reviewed. We  will review the cytology, pathology and microbiology results with the patient when they become available. Outpatient followup will be with Saralyn Pilar, NP or Dr. Delton Coombes.    Levy Pupa, MD, PhD 07/03/2023, 8:39 AM [facility] [phone] or if no answer before 7:00PM call [phone] For any issues after 7:00PM please call eLink [phone]

## 2023-07-03 NOTE — Interval H&P Note (Signed)
 History and Physical Interval Note:  07/03/2023 7:18 AM  Erin [name]  has presented today for surgery, with the diagnosis of LEFT UPPER LOPE NODULE.  The various methods of treatment have been discussed with the patient and family. After consideration of risks, benefits and other options for treatment, the patient has consented to  Procedure(s): ROBOTIC ASSISTED NAVIGATIONAL BRONCHOSCOPY (N/A) as a surgical intervention.  The patient's history has been reviewed, patient examined, no change in status, stable for surgery.  I have reviewed the patient's chart and labs.  Questions were answered to the patient's satisfaction.     Leslye Peer

## 2023-07-04 ENCOUNTER — Encounter: Payer: Self-pay | Admitting: Dermatology

## 2023-07-04 ENCOUNTER — Encounter (HOSPITAL_COMMUNITY): Payer: Self-pay | Admitting: Emergency Medicine

## 2023-07-04 ENCOUNTER — Encounter (HOSPITAL_COMMUNITY): Payer: Medicare PPO | Admitting: Occupational Therapy

## 2023-07-04 LAB — CYTOLOGY - NON PAP

## 2023-07-05 ENCOUNTER — Ambulatory Visit (INDEPENDENT_AMBULATORY_CARE_PROVIDER_SITE_OTHER): Payer: Medicare Other | Admitting: Dermatology

## 2023-07-05 ENCOUNTER — Other Ambulatory Visit (HOSPITAL_COMMUNITY): Payer: Self-pay | Admitting: Cardiology

## 2023-07-05 ENCOUNTER — Other Ambulatory Visit: Payer: Self-pay | Admitting: Surgical

## 2023-07-05 VITALS — BP 98/60 | HR 78

## 2023-07-05 DIAGNOSIS — L579 Skin changes due to chronic exposure to nonionizing radiation, unspecified: Secondary | ICD-10-CM

## 2023-07-05 DIAGNOSIS — C4491 Basal cell carcinoma of skin, unspecified: Secondary | ICD-10-CM

## 2023-07-05 DIAGNOSIS — C44319 Basal cell carcinoma of skin of other parts of face: Secondary | ICD-10-CM

## 2023-07-05 DIAGNOSIS — L814 Other melanin hyperpigmentation: Secondary | ICD-10-CM

## 2023-07-05 LAB — CULTURE, BAL-QUANTITATIVE W GRAM STAIN

## 2023-07-05 LAB — ACID FAST SMEAR (AFB, MYCOBACTERIA): Acid Fast Smear: NEGATIVE

## 2023-07-05 NOTE — Progress Notes (Unsigned)
   Follow-Up Visit   Subjective  Erin [name] is a 63 y.o. female who presents for the following: Mohs of right malar cheek  The following portions of the chart were reviewed this encounter and updated as appropriate: medications, allergies, medical history  Review of Systems:  No other skin or systemic complaints except as noted in HPI or Assessment and Plan.  Objective  Well appearing patient in no apparent distress; mood and affect are within normal limits.  A focused examination was performed of the following areas: Right malar cheek Relevant physical exam findings are noted in the Assessment and Plan.     Assessment & Plan      No follow-ups on file.  I, Tillie Fantasia, CMA, am acting as scribe for Gwenith Daily, MD.   Documentation: I have reviewed the above documentation for accuracy and completeness, and I agree with the above.  Gwenith Daily, MD

## 2023-07-05 NOTE — Patient Instructions (Signed)
 Important Information   Due to recent changes in healthcare laws, you may see results of your pathology and/or laboratory studies on MyChart before the doctors have had a chance to review them. We understand that in some cases there may be results that are confusing or concerning to you. Please understand that not all results are received at the same time and often the doctors may need to interpret multiple results in order to provide you with the best plan of care or course of treatment. Therefore, we ask that you please give [place] 2 business days to thoroughly review all your results before contacting the office for clarification. Should we see a critical lab result, you will be contacted sooner.     If You Need Anything After Your Visit   If you have any questions or concerns for your doctor, please call our main line at [phone]. If no one answers, please leave a voicemail as directed and we will return your call as soon as possible. Messages left after 4 pm will be answered the following business day.    You may also send [place] a message via MyChart. We typically respond to MyChart messages within 1-2 business days.  For prescription refills, please ask your pharmacy to contact our office. Our fax number is [phone].  If you have an urgent issue when the clinic is closed that cannot wait until the next business day, you can page your doctor at the number below.     Please note that while we do our best to be available for urgent issues outside of office hours, we are not available 24/7.    If you have an urgent issue and are unable to reach [place], you may choose to seek medical care at your doctor's office, retail clinic, urgent care center, or emergency room.   If you have a medical emergency, please immediately call 911 or go to the emergency department. In the event of inclement weather, please call our main line at [phone] for an update on the status of any delays or  closures.  Dermatology Medication Tips: Please keep the boxes that topical medications come in in order to help keep track of the instructions about where and how to use these. Pharmacies typically print the medication instructions only on the boxes and not directly on the medication tubes.   If your medication is too expensive, please contact our office at [phone] or send [place] a message through MyChart.    We are unable to tell what your co-pay for medications will be in advance as this is different depending on your insurance coverage. However, we may be able to find a substitute medication at lower cost or fill out paperwork to get insurance to cover a needed medication.    If a prior authorization is required to get your medication covered by your insurance company, please allow [place] 1-2 business days to complete this process.   Drug prices often vary depending on where the prescription is filled and some pharmacies may offer cheaper prices.   The website www.goodrx.com contains coupons for medications through different pharmacies. The prices here do not account for what the cost may be with help from insurance (it may be cheaper with your insurance), but the website can give you the price if you did not use any insurance.  - You can print the associated coupon and take it with your prescription to the pharmacy.  - You may also stop by our office during regular  business hours and pick up a GoodRx coupon card.  - If you need your prescription sent electronically to a different pharmacy, notify our office through [facility] or by phone at [phone]

## 2023-07-06 ENCOUNTER — Encounter: Payer: Self-pay | Admitting: Dermatology

## 2023-07-06 ENCOUNTER — Encounter (HOSPITAL_COMMUNITY): Payer: Medicare PPO | Admitting: Occupational Therapy

## 2023-07-07 ENCOUNTER — Encounter (HOSPITAL_COMMUNITY): Payer: Self-pay | Admitting: Emergency Medicine

## 2023-07-07 ENCOUNTER — Other Ambulatory Visit: Payer: Self-pay

## 2023-07-07 ENCOUNTER — Emergency Department (HOSPITAL_COMMUNITY)
Admission: EM | Admit: 2023-07-07 | Discharge: 2023-07-07 | Discharge status: Against Medical Advice | Payer: Medicare Other | Attending: Emergency Medicine | Admitting: Emergency Medicine

## 2023-07-07 DIAGNOSIS — Z5321 Procedure and treatment not carried out due to patient leaving prior to being seen by health care provider: Secondary | ICD-10-CM | POA: Insufficient documentation

## 2023-07-07 DIAGNOSIS — R42 Dizziness and giddiness: Secondary | ICD-10-CM | POA: Diagnosis present

## 2023-07-07 DIAGNOSIS — R519 Headache, unspecified: Secondary | ICD-10-CM | POA: Insufficient documentation

## 2023-07-07 HISTORY — DX: Malignant neoplasm of unspecified part of unspecified bronchus or lung: C34.90

## 2023-07-07 LAB — CBG MONITORING, ED: Glucose-Capillary: 104 mg/dL — ABNORMAL HIGH (ref 70–99)

## 2023-07-07 LAB — CBC WITH DIFFERENTIAL/PLATELET
Abs Immature Granulocytes: 0.25 10*3/uL — ABNORMAL HIGH (ref 0.00–0.07)
Basophils Absolute: 0.1 10*3/uL (ref 0.0–0.1)
Basophils Relative: 1 %
Eosinophils Absolute: 0.1 10*3/uL (ref 0.0–0.5)
Eosinophils Relative: 1 %
HCT: 33.8 % — ABNORMAL LOW (ref 36.0–46.0)
Hemoglobin: 11.2 g/dL — ABNORMAL LOW (ref 12.0–15.0)
Immature Granulocytes: 2 %
Lymphocytes Relative: 29 %
Lymphs Abs: 3 10*3/uL (ref 0.7–4.0)
MCH: 26.3 pg (ref 26.0–34.0)
MCHC: 33.1 g/dL (ref 30.0–36.0)
MCV: 79.3 fL — ABNORMAL LOW (ref 80.0–100.0)
Monocytes Absolute: 0.9 10*3/uL (ref 0.1–1.0)
Monocytes Relative: 9 %
Neutro Abs: 6 10*3/uL (ref 1.7–7.7)
Neutrophils Relative %: 58 %
Platelets: 448 10*3/uL — ABNORMAL HIGH (ref 150–400)
RBC: 4.26 MIL/uL (ref 3.87–5.11)
RDW: 17.8 % — ABNORMAL HIGH (ref 11.5–15.5)
WBC: 10.3 10*3/uL (ref 4.0–10.5)
nRBC: 0 % (ref 0.0–0.2)

## 2023-07-07 LAB — BASIC METABOLIC PANEL
Anion gap: 9 (ref 5–15)
BUN: 23 mg/dL (ref 8–23)
CO2: 25 mmol/L (ref 22–32)
Calcium: 9.7 mg/dL (ref 8.9–10.3)
Chloride: 101 mmol/L (ref 98–111)
Creatinine, Ser: 1.38 mg/dL — ABNORMAL HIGH (ref 0.44–1.00)
GFR, Estimated: 44 mL/min — ABNORMAL LOW (ref >60)
Glucose, Bld: 101 mg/dL — ABNORMAL HIGH (ref 70–99)
Potassium: 3.7 mmol/L (ref 3.5–5.1)
Sodium: 135 mmol/L (ref 135–145)

## 2023-07-07 NOTE — ED Triage Notes (Signed)
 Pt c/o sudden dizziness today. States she gets dizzy a lot when her bp drops. States she checked her bp and was 70 sys. Pt a/o at this time. States still dizzy at this time and worse with movement. "Everything is going in a circle". C/o headache as well. Moving all extremities. Nad in triage.

## 2023-07-07 NOTE — ED Notes (Signed)
Not in waiting area x 2 

## 2023-07-07 NOTE — ED Notes (Signed)
Pt not in waiting area x 1 

## 2023-07-08 ENCOUNTER — Other Ambulatory Visit: Payer: Self-pay | Admitting: Surgical

## 2023-07-08 LAB — AEROBIC/ANAEROBIC CULTURE W GRAM STAIN (SURGICAL/DEEP WOUND): Gram Stain: NONE SEEN

## 2023-07-10 ENCOUNTER — Ambulatory Visit: Payer: Self-pay | Attending: Cardiology

## 2023-07-10 ENCOUNTER — Telehealth (HOSPITAL_COMMUNITY): Payer: Self-pay

## 2023-07-10 DIAGNOSIS — I5022 Chronic systolic (congestive) heart failure: Secondary | ICD-10-CM | POA: Diagnosis not present

## 2023-07-10 DIAGNOSIS — Z85828 Personal history of other malignant neoplasm of skin: Secondary | ICD-10-CM | POA: Insufficient documentation

## 2023-07-10 DIAGNOSIS — Z95810 Presence of automatic (implantable) cardiac defibrillator: Secondary | ICD-10-CM

## 2023-07-10 NOTE — Telephone Encounter (Signed)
 Received call from patient who states that she was in the ER on Saturday due to low BP. Per patient she got very dizzy and lightheaded at work- BP was in the 80's systolic and upon arrival at ER in the 70's. Patient reports the ER provider recommended that she stop losartan, and decrease dose of Metoprolol. Patient reports she is reluctant to do this due to not knowing what Dr. Shirlee Latch thought.   Patient also reports that she was diagnosed with lung ca last week as well.   Patient reports BP this morning was 92/50 and she feels better than she did Saturday. Scheduled patient an appointment to see APP on 3/11 to review symptoms and medications. Advised patient would forward to Dr. Shirlee Latch to see what he would like to do about losartan/metop dosages. Patient verbalized understanding.

## 2023-07-10 NOTE — Assessment & Plan Note (Signed)
 Presenting today for follow-up of anxiety with panic attacks.  Sertraline 25 mg daily was started last month.  6-week follow-up arranged.  She feels that anxiety remains poorly controlled endorse insomnia.  She is scheduled to see psychiatry on 2/3. -Treatment options reviewed.  Through shared decision making, sertraline has been increased to 50 mg daily.  Will also add trazodone 50 mg nightly in the setting of psychophysiologic insomnia. -Follow-up in 6 weeks for reassessment

## 2023-07-10 NOTE — Progress Notes (Unsigned)
 EPIC Encounter for ICM Monitoring  Patient Name: Erin [name] is a 63 y.o. female Date: 07/10/2023 Primary Care Physican: Billie Lade, MD Primary Cardiologist: Shirlee Latch Electrophysiologist: Elberta Fortis 08/11/2021 Office Weight: 121 lbs. 03/25/2022 Office Weight 125-127 lbs 03/13/2023 Weight: 110 lbs 03/24/2023 Weight: 115 lbs 05/26/2023 Weight: 105 lbs 07/10/2023 Weight: 103 lbs   VT-NS (>4 beats, >200 bpm) 3                                                          Spoke with patient and heart failure questions reviewed.  Transmission results reviewed.  Pt reports not feeling well and she has bloating in stomach.  She has good urine output.  ED Visit on 2/28 & 3/1.   She was dx with lung cancer last week.    Optivol thoracic impedance suggesting possible fluid accumulation starting 2/4.   Prescribed: Furosemide 20 mg Take 2 tablets (40 mg total) by mouth every AM and 1 tablet (20 mg total) every evening Potassium 20 mEq take 2 tablets(40 mEq total) by mouth twice a day Spironolactone 25 mg take 0.5 tablet (12.5 mg total) by mouth daily   Labs: 07/08/2023 Creatinine 1.7,   BUN 22, Potassium 3.8, Sodium 140, GFR 34, BNP 491 07/07/2023 Creatinine 1.38, BUN 23, Potassium 3.7, Sodium 135, GFR 44  06/18/2023 Creatinine 1.19, BUN 13, Potassium 3.5, Sodium 138, GFR 52  05/26/2023 Creatinine 1.85, BUN 16, Potassium 5.0, Sodium 133, GFR 31 A complete set of results can be found in Results Review.   Recommendations:  Copy sent to Dr Shirlee Latch for review and recommendations if needed.   Confirmed she is taking Lasix as prescribed.     Follow-up plan: ICM clinic phone appointment on 07/17/2023 (manual) to recheck fluid levels.   91 day device clinic remote transmission 08/16/2023.     EP/Cardiology Office Visits:  07/18/2023 with HF Clinic.  Recall 06/04/2023 with Dr Elberta Fortis (1 yr f/u).     Copy of ICM check sent to Dr. Elberta Fortis.    3 month ICM trend: 07/10/2023.    12-14 Month ICM trend:      Karie Soda, RN 07/10/2023 10:02 AM

## 2023-07-10 NOTE — Assessment & Plan Note (Signed)
 Dermatology referral placed today at the patient's request for routine examination in the setting of a known history of basal cell carcinoma.

## 2023-07-10 NOTE — Progress Notes (Signed)
 Increase Lasix to 40 mg bid with BMET 1 week.

## 2023-07-10 NOTE — Telephone Encounter (Signed)
 Decrease spironolactone to 12.5 mg and take at bedtime.  Make sure to take Toprol XL 12.5 at bedtime and losartan 12.5 at bedtime.

## 2023-07-11 ENCOUNTER — Ambulatory Visit: Payer: Medicaid Other | Admitting: Acute Care

## 2023-07-11 ENCOUNTER — Encounter: Payer: Self-pay | Admitting: Acute Care

## 2023-07-11 ENCOUNTER — Other Ambulatory Visit: Payer: Self-pay | Admitting: *Deleted

## 2023-07-11 ENCOUNTER — Encounter: Payer: Self-pay | Admitting: Internal Medicine

## 2023-07-11 ENCOUNTER — Telehealth: Payer: Self-pay | Admitting: *Deleted

## 2023-07-11 ENCOUNTER — Ambulatory Visit (INDEPENDENT_AMBULATORY_CARE_PROVIDER_SITE_OTHER): Payer: Medicare Other | Admitting: Internal Medicine

## 2023-07-11 VITALS — BP 97/55 | HR 88 | Temp 98.0°F | Ht 60.0 in | Wt 108.4 lb

## 2023-07-11 VITALS — BP 106/70 | HR 83 | Ht 60.0 in | Wt 107.6 lb

## 2023-07-11 DIAGNOSIS — C3490 Malignant neoplasm of unspecified part of unspecified bronchus or lung: Secondary | ICD-10-CM | POA: Diagnosis not present

## 2023-07-11 DIAGNOSIS — A491 Streptococcal infection, unspecified site: Secondary | ICD-10-CM

## 2023-07-11 DIAGNOSIS — Z9889 Other specified postprocedural states: Secondary | ICD-10-CM

## 2023-07-11 DIAGNOSIS — F17210 Nicotine dependence, cigarettes, uncomplicated: Secondary | ICD-10-CM

## 2023-07-11 DIAGNOSIS — R110 Nausea: Secondary | ICD-10-CM

## 2023-07-11 DIAGNOSIS — R112 Nausea with vomiting, unspecified: Secondary | ICD-10-CM

## 2023-07-11 DIAGNOSIS — R627 Adult failure to thrive: Secondary | ICD-10-CM

## 2023-07-11 DIAGNOSIS — G249 Dystonia, unspecified: Secondary | ICD-10-CM

## 2023-07-11 DIAGNOSIS — R634 Abnormal weight loss: Secondary | ICD-10-CM | POA: Diagnosis not present

## 2023-07-11 DIAGNOSIS — F17200 Nicotine dependence, unspecified, uncomplicated: Secondary | ICD-10-CM

## 2023-07-11 DIAGNOSIS — R1013 Epigastric pain: Secondary | ICD-10-CM

## 2023-07-11 DIAGNOSIS — R109 Unspecified abdominal pain: Secondary | ICD-10-CM

## 2023-07-11 MED ORDER — PENICILLIN V POTASSIUM 500 MG PO TABS: 500.0000 mg | ORAL_TABLET | Freq: Four times a day (QID) | ORAL | 0 refills | Status: DC

## 2023-07-11 MED ORDER — FUROSEMIDE 20 MG PO TABS: 40.0000 mg | ORAL_TABLET | Freq: Two times a day (BID) | ORAL | 3 refills | Status: DC

## 2023-07-11 NOTE — Patient Instructions (Signed)
 It is good to see you today. I am glad you have done well after your bronchoscopy with biopsies. Your biopsy was positive for adenocarcinoma of the left upper lobe. This is a non-small cell lung cancer. We will order a PET scan to complete staging You will get a call to get this scheduled through [facility] You will also need an MRI brain which will also be scheduled to [facility]. I have referred you to radiation oncology at [facility] cancer center You will get a call for your consultation date I have also referred you to medical oncology at [facility] cancer center You will get a call to get a consultation date They will take great care of you.  Please work on quitting smoking. You can receive free nicotine replacement therapy (patches, gum, or mints) by calling 1-800-QUIT NOW. Please call so we can get you on the path to becoming a non-smoker. I know it is hard, but you can do this!  Hypnosis for smoking cessation  [employer] [phone]  Acupuncture for smoking cessation  [employer] [phone]

## 2023-07-11 NOTE — Progress Notes (Signed)
 Spoke with patient and advised Dr Alford Highland recommendation is Lasix 40 mg twice a day.    Also BMET needed in a week.  Pt will increase Furosemide today.  BMET will be drawn at 07/18/2023 HF clinic OV.  She verbalized understanding.  She has Furosemide supply on hand and will call when refill is needed.  EPIC script updated.  BMET order placed and added to the 3/11 appointment notes.    ICM remote transmission changed to 3/17 since patient will have fluid levels checked at 3/11 OV.

## 2023-07-11 NOTE — Telephone Encounter (Signed)
Attempted to call patient, left message for patient to call back to office.   

## 2023-07-11 NOTE — Progress Notes (Signed)
 History of Present Illness [name] is a 63 y.o. female current every day smoker with a 60 pack year smoking history with coronary artery disease, cardiomyopathy, hyperlipidemia, and hypertension who presents for evaluation of an abnormal CT scan of the chest. She is a patient of Dr. Delton Coombes.  Synopsis Left upper lobe ground glass nodule was first identified on May 10, 2019, and remained stable on serial imaging, including in October 2024. However, a CT-PA on April 15, 2023, for dyspnea evaluation showed a more cystic component. A dedicated CT chest without contrast on April 28, 2023, confirmed the nodule measuring 1.3 by 1.4 centimeters with an area of lucency inferiorly, suggesting cystic change versus bronchiectasis.  She has dyspnea, and heart failure that she feels contributes to this. PFT's 05/2026 show moderately severe obstruction without bronchodilator response, with FEV1 at 77% predicted. Lung volumes were normal, but diffusion capacity was decreased and did not fully correct when adjusted for alveolar volume.  She has a history of skin cancer , her father had COPD.There is no family history of lung cancer. She denies any significant exposures to  TB, woodworking, pottery, welding, dust, fumes, chemicals, or water damage at home. She underwent bronchoscopy with biopsy 07/03/2023 by Dr. Delton Coombes. She is here today to review the cytology and ensure she tolerated the procedure without complications.  07/11/2023 Patient presents for follow-up after bronchoscopy with biopsies.  She states that she has done well.  No bleeding, fever, discolored secretions, dyspnea, or adverse reaction to anesthesia.  We have discussed her cytology and biopsy results.  I explained that the left upper lobe pulmonary nodule was positive for adenocarcinoma which is a non-small cell lung cancer.  Patient has not yet had PET scan so I am unsure of staging.  I explained that treatment options are determined by  staging of cancer, and that in order to stage her cancer we need to do both a PET scan and an MRI of the brain.  She is in agreement with both of these procedures to complete staging.  I am going to go ahead and refer her to radiation oncology as well as medical oncology as I am unsure if she will need just 1 or both.  She does have a pretty significant cardiology history so I am unsure if she will be a candidate for surgery if she is stage I.  She will need repeat pulmonary function test to determine if she is even a candidate as she is a 60-pack-year smoker who is working on quitting present.  Last PFTs were done in 2018.  Test Results: Cytology 07/03/2023 A.  LEFT LUNG, UPPER LOBE, NODULE, BRUSHING:  - Malignant  - Adenocarcinoma   B.  LEFT LUNG, UPPER LOBE, NODULE, FINE NEEDLE ASPIRATION  BIOPSY:  - Malignant  - Adenocarcinoma   CT chest without contrast: Left upper lobe ground glass nodule 1.3 x 1.4 cm with an area of lucency inferiorly representing cystic change (04/28/2023)   Pulmonary function testing: Moderately severe obstruction without bronchodilator response. FEV1 77% predicted. Normal lung volumes. Decreased diffusion capacity not fully corrected when adjusted for alveolar volume (06/08/2016)         Latest Ref Rng & Units 07/07/2023    3:28 PM 06/18/2023   10:07 AM 04/15/2023    1:25 AM  CBC  WBC 4.0 - 10.5 K/uL 10.3  7.8  9.6   Hemoglobin 12.0 - 15.0 g/dL 81.1  91.4  78.2   Hematocrit 36.0 - 46.0 % 33.8  36.1  34.8   Platelets 150 - 400 K/uL 448  470  602        Latest Ref Rng & Units 07/07/2023    3:28 PM 06/18/2023   10:07 AM 05/26/2023    3:06 PM  BMP  Glucose 70 - 99 mg/dL 161  096  89   BUN 8 - 23 mg/dL 23  13  16    Creatinine 0.44 - 1.00 mg/dL 0.45  4.09  8.11   Sodium 135 - 145 mmol/L 135  138  133   Potassium 3.5 - 5.1 mmol/L 3.7  3.5  5.0   Chloride 98 - 111 mmol/L 101  108  101   CO2 22 - 32 mmol/L 25  19  22    Calcium 8.9 - 10.3 mg/dL 9.7  9.3  9.7      BNP    Component Value Date/Time   BNP 95.4 05/26/2023 1506    ProBNP    Component Value Date/Time   PROBNP 910 (H) 07/07/2016 0900    PFT    Component Value Date/Time   FEV1PRE 1.80 05/13/2016 1558   FEV1POST 1.81 05/13/2016 1558   FVCPRE 2.45 05/13/2016 1558   FVCPOST 2.44 05/13/2016 1558   TLC 4.35 05/13/2016 1558   DLCOUNC 11.75 05/13/2016 1558   PREFEV1FVCRT 73 05/13/2016 1558   PSTFEV1FVCRT 74 05/13/2016 1558    DG Chest Port 1 View Result Date: 07/03/2023 CLINICAL DATA:  [id number] S/P bronchoscopy with biopsy [id number] EXAM: PORTABLE CHEST 1 VIEW COMPARISON:  04/28/2023 FINDINGS: Left-sided implanted cardiac device remains in place. Stable heart size. New fiducial marker in the left upper lobe with subtle adjacent opacity compatible with post biopsy changes. Lungs are otherwise clear. No pneumothorax. IMPRESSION: No pneumothorax following bronchoscopy and biopsy. Electronically Signed   By: Duanne Guess D.O.   On: 07/03/2023 09:15   DG C-ARM BRONCHOSCOPY Result Date: 07/03/2023 C-ARM BRONCHOSCOPY: Fluoroscopy was utilized by the requesting physician.  No radiographic interpretation.   CT ABDOMEN PELVIS W CONTRAST Result Date: 06/18/2023 CLINICAL DATA:  Intermittent abdominal pain with vomiting. EXAM: CT ABDOMEN AND PELVIS WITH CONTRAST TECHNIQUE: Multidetector CT imaging of the abdomen and pelvis was performed using the standard protocol following bolus administration of intravenous contrast. RADIATION DOSE REDUCTION: This exam was performed according to the departmental dose-optimization program which includes automated exposure control, adjustment of the mA and/or kV according to patient size and/or use of iterative reconstruction technique. CONTRAST:  OMNIPAQUE IOHEXOL 300 MG/ML  SOLN COMPARISON:  08/07/2022 FINDINGS: Lower chest: Unremarkable. Hepatobiliary: At least 3 tiny hypoattenuating lesions are seen in the liver, previously characterized as benign adrenal  adenomas on MRI abdomen 01/06/2023 and stable since that time. Gallbladder is surgically absent. No intrahepatic or extrahepatic biliary dilation. Pancreas: No focal mass lesion. No dilatation of the main duct. No intraparenchymal cyst. No peripancreatic edema. Spleen: No splenomegaly. No suspicious focal mass lesion. Adrenals/Urinary Tract: Stable bilateral adrenal nodules, left greater than right and previously characterized as adenomas (MRI 01/06/2023. Left adrenal adenoma measures 2.4 cm today which compares to 2.6 cm when remeasured in a similar fashion on the previous CT. Kidneys unremarkable. No evidence for hydroureter.The urinary bladder appears normal for the degree of distention. Stomach/Bowel: Stomach is unremarkable. No gastric wall thickening. No evidence of outlet obstruction. Duodenum is normally positioned as is the ligament of Treitz. No small bowel wall thickening. No small bowel dilatation. The terminal ileum is normal. The appendix is not well visualized, but there is no edema or  inflammation in the region of the cecal tip to suggest appendicitis. No gross colonic mass. No colonic wall thickening. Vascular/Lymphatic: There is advanced atherosclerotic calcification of the abdominal aorta without aneurysm. Abdominal aorta measures up to 3.2 cm diameter with prominent mural thrombus. Accessory renal arteries noted bilaterally. There is no gastrohepatic or hepatoduodenal ligament lymphadenopathy. No retroperitoneal or mesenteric lymphadenopathy. No pelvic sidewall lymphadenopathy. Reproductive: Hysterectomy.  There is no adnexal mass. Other: No intraperitoneal free fluid. Musculoskeletal: No worrisome lytic or sclerotic osseous abnormality. IMPRESSION: 1. No acute findings in the abdomen or pelvis. Specifically, no findings to explain the patient's history of intermittent abdominal pain with vomiting. 2. Stable bilateral adrenal nodules, left greater than right and previously characterized as  adenomas (MRI 01/06/2023). 3. Stable tiny hypoattenuating lesions in the liver, previously characterized as benign adrenal adenomas on MRI abdomen 01/06/2023 and stable since that time. 4. Juxtarenal 3.2 cm abdominal aortic aneurysm with accessory renal arteries noted bilaterally. 5.  Aortic Atherosclerosis (ICD10-I70.0). Electronically Signed   By: Kennith Center M.D.   On: 06/18/2023 13:57     Past medical hx Past Medical History:  Diagnosis Date   AICD (automatic cardioverter/defibrillator) present    Allergic reaction to alpha-gal    Allergy 03/05/2022   Anemia    Anxiety state 03/25/2016   Arthritis    Basal cell carcinoma of forehead    Brain aneurysm    CHF (congestive heart failure) (HCC)    Coronary artery disease    a. 03/11/16 PCI with DES-->Prox/Mid Cx;  b. 03/14/16 PCI with DES x2-->RCA, EF 30-35%.   Depression 06/09/2010   Encounter for general adult medical examination with abnormal findings 05/04/2022   Essential hypertension    Hx   GERD (gastroesophageal reflux disease)    HFrEF (heart failure with reduced ejection fraction) (HCC)    a. 10/2016 Echo: EF 35-40%, Gr1 DD, mild focal basal septal hypertrophy, basal inflat, mid inflat, basal antlat AK. Mid infept/inf/antlat, apical lateral sev HK. Mod MR. mildly reduced RV fxn. Mild TR.   History of pneumonia    Hyperlipidemia    IBS (irritable bowel syndrome)    Ischemic cardiomyopathy    a. 10/2016 Echo: EF 35-40%, Gr1 DD.   Lung cancer [facility])    Mitral regurgitation    Neuromuscular disorder (HCC)    NSTEMI (non-ST elevated myocardial infarction) (HCC) 03/10/2016   Pneumonia 03/2016   Shingles    Squamous cell cancer of skin of nose    Thrombocytosis 03/26/2016   Tobacco abuse    Trichimoniasis    Wears dentures    Wears glasses      Social History   Tobacco Use   Smoking status: Former    Current packs/day: 1.50    Average packs/day: 1.5 packs/day for 40.0 years (60.0 ttl pk-yrs)    Types: Cigarettes    Smokeless tobacco: Never   Tobacco comments:    Smokes off and on 06/14/23 [name], CMA   Vaping Use   Vaping status: Never Used  Substance Use Topics   Alcohol use: Not Currently    Comment: occasionally   Drug use: Not Currently    Types: Marijuana    Ms.Babineau reports that she has quit smoking. Her smoking use included cigarettes. She has a 60 pack-year smoking history. She has never used smokeless tobacco. She reports that she does not currently use alcohol. She reports that she does not currently use drugs after having used the following drugs: Marijuana.  Tobacco Cessation: Counseling given: Not  Answered Tobacco comments: Smokes off and on 06/14/23 [name], CMA  Currently working on quitting smoking. Using patches No cigarette in 5 days.  Past surgical hx, Family hx, Social hx all reviewed.  Current Outpatient Medications on File Prior to Visit  Medication Sig   aspirin EC 81 MG tablet Take 1 tablet (81 mg total) by mouth every evening. Okay to restart on 2/29/2025   atorvastatin (LIPITOR) 80 MG tablet Take 1 tablet (80 mg total) by mouth daily.   clopidogrel (PLAVIX) 75 MG tablet Take 1 tablet (75 mg total) by mouth daily. Okay to restart on 07/04/2023   dapagliflozin propanediol (FARXIGA) 10 MG TABS tablet Take 1 tablet (10 mg total) by mouth daily before breakfast.   EPINEPHrine (EPIPEN 2-PAK) 0.3 mg/0.3 mL IJ SOAJ injection Inject 0.3 mg into the muscle as needed for anaphylaxis.   Evolocumab (REPATHA SURECLICK) 140 MG/ML SOAJ Inject 140 mg into the skin every 14 (fourteen) days.   fenofibrate (TRICOR) 145 MG tablet Take 1 tablet (145 mg total) by mouth daily.   furosemide (LASIX) 20 MG tablet TAKE 40MG  (2) TABLETS IN THE MORNING AND 20MG  (1) TABLET IN THE EVENING   gabapentin (NEURONTIN) 300 MG capsule Take 1 capsule by mouth at bedtime   hydrOXYzine (VISTARIL) 25 MG capsule Take 1 capsule (25 mg total) by mouth every 8 (eight) hours as needed.   linaclotide  (LINZESS) 145 MCG CAPS capsule Take 1 capsule (145 mcg total) by mouth daily before breakfast.   loperamide (IMODIUM A-D) 2 MG tablet Take 2 mg by mouth 4 (four) times daily as needed for diarrhea or loose stools.   losartan (COZAAR) 25 MG tablet Take 0.5 tablets (12.5 mg total) by mouth at bedtime.   metoCLOPramide (REGLAN) 10 MG tablet Take 1 tablet (10 mg total) by mouth every 6 (six) hours.   metoprolol succinate (TOPROL XL) 25 MG 24 hr tablet Take 0.5 tablets (12.5 mg total) by mouth daily.   Multiple Vitamins-Minerals (MULTIVITAMIN WITH MINERALS) tablet Take 1 tablet by mouth daily.   nitroGLYCERIN (NITROSTAT) 0.4 MG SL tablet Place 1 tablet (0.4 mg total) under the tongue every 5 (five) minutes x 3 doses as needed for chest pain (if no relief after 2nd dose, proceed to the ED for an evaluation or call 911).   ondansetron (ZOFRAN) 4 MG tablet Take 1 tablet (4 mg total) by mouth every 6 (six) hours as needed for nausea or vomiting.   pantoprazole (PROTONIX) 20 MG tablet Take 1 tablet (20 mg total) by mouth daily.   potassium chloride SA (KLOR-CON M) 20 MEQ tablet Take 2 tablets (40 mEq total) by mouth 2 (two) times daily.   rOPINIRole (REQUIP) 0.5 MG tablet Take 0.5 mg by mouth at bedtime.    scopolamine (TRANSDERM-SCOP) 1 MG/3DAYS Place 1 patch (1.5 mg total) onto the skin every 3 (three) days.   sertraline (ZOLOFT) 50 MG tablet Take 1 tablet (50 mg total) by mouth daily.   spironolactone (ALDACTONE) 25 MG tablet TAKE 1 TABLET BY MOUTH AT BEDTIME   sucralfate (CARAFATE) 1 GM/10ML suspension Take 10 mLs (1 g total) by mouth 4 (four) times daily.   traZODone (DESYREL) 50 MG tablet Take 1 tablet (50 mg total) by mouth at bedtime.   varenicline (CHANTIX) 1 MG tablet Take 1 tablet (1 mg total) by mouth 2 (two) times daily.   Vitamin D, Ergocalciferol, (DRISDOL) 1.25 MG (50000 UNIT) CAPS capsule Take 50,000 Units by mouth every 7 (seven) days.  No current facility-administered medications on file  prior to visit.     Allergies  Allergen Reactions   Alpha-Gal Shortness Of Breath, Nausea And Vomiting and Dermatitis   Other Shortness Of Breath, Diarrhea, Nausea And Vomiting and Nausea Only    All- red meats/dairy  Medications in Capsule form    Tape Other (See Comments)    PEELS SKIN OFF  (PAPER TAPE IS FINE)    Review Of Systems:  Constitutional:   No  weight loss, night sweats,  Fevers, chills, fatigue, or  lassitude.  HEENT:   No headaches,  Difficulty swallowing,  Tooth/dental problems, or  Sore throat,                No sneezing, itching, ear ache, nasal congestion, post nasal drip,   CV:  No chest pain,  Orthopnea, PND, swelling in lower extremities, anasarca, dizziness, palpitations, syncope.   GI  No heartburn, indigestion, abdominal pain, nausea, vomiting, diarrhea, change in bowel habits, loss of appetite, bloody stools.   Resp: No shortness of breath with exertion or at rest.  No excess mucus, no productive cough,  No non-productive cough,  No coughing up of blood.  No change in color of mucus.  No wheezing.  No chest wall deformity  Skin: no rash or lesions.  GU: no dysuria, change in color of urine, no urgency or frequency.  No flank pain, no hematuria   MS:  No joint pain or swelling.  No decreased range of motion.  No back pain.  Psych:  No change in mood or affect. No depression or anxiety.  No memory loss.   Vital Signs BP 106/70 (BP Location: Right Arm, Patient Position: Sitting, Cuff Size: Normal)   Pulse 83   Ht 5' (1.524 m)   Wt 107 lb 9.6 oz (48.8 kg)   SpO2 100%   BMI 21.01 kg/m    Physical Exam:  General- No distress,  A&Ox3, pleasant ENT: No sinus tenderness, TM clear, pale nasal mucosa, no oral exudate,no post nasal drip, no LAN Cardiac: S1, S2, regular rate and rhythm, no murmur Chest: No wheeze/ rales/ dullness; no accessory muscle use, no nasal flaring, no sternal retractions Abd.: Soft Non-tender, ND, BS +, Body mass index is 21.01  kg/m.  Ext: No clubbing cyanosis, edema Neuro:  normal strength, MAE x 4, A&O x 3, appropriate Skin: No rashes, warm and dry, no obvious lesions Psych: normal mood and behavior   Assessment/Plan New diagnosis adenocarcinoma of the left upper lobe Post bronchoscopy with biopsies Current every day smoker, working on quitting Rare Streptococcus pneumonia on Aerobic/ Anaerobic Cx Plan I am glad you have done well after your bronchoscopy with biopsies. Your biopsy was positive for adenocarcinoma of the left upper lobe. This is a non-small cell lung cancer. We will order a PET scan to complete staging You will get a call to get this scheduled through [facility] You will also need an MRI brain which will also be scheduled to [facility]. I have referred you to radiation oncology at [facility] cancer center You will get a call for your consultation date I have also referred you to medical oncology at [facility] cancer center You will get a call to get a consultation date They will take great care of you.  I have sent in penicillin for a positive culture done during your procedure.  Penicillin 500 mg every 6 hours x 5 days Please work on quitting smoking. You can  receive free nicotine replacement therapy (patches, gum, or mints) by calling 1-800-QUIT NOW. Please call so we can get you on the path to becoming a non-smoker. I know it is hard, but you can do this!  Hypnosis for smoking cessation  [employer] [phone]  Acupuncture for smoking cessation  [employer] [phone]    I spent 25 minutes dedicated to the care of this patient on the date of this encounter to include pre-visit review of records, face-to-face time with the patient discussing conditions above, post visit ordering of testing, clinical documentation with the electronic health record, making appropriate referrals as documented, and communicating necessary information to the  patient's healthcare team.   Bevelyn Ngo, NP 07/11/2023  9:51 AM

## 2023-07-11 NOTE — Patient Instructions (Signed)
 It was good to see you again today!  For now continue pantoprazole 40 mg twice daily-best taken before supper and breakfast  Because your left forearm is shaking, stop taking metoclopramide and never take it again.  We will add that to your allergy list.  New prescription for Zofran 4 mg ODT tablets (1 under the tongue every 6 hours as needed for nausea.  Dispense 60 with 3 refills.  CTA abdomen to rule out mesenteric ischemia  Celiac screen TTG IgA and total serum IgA level  Fasting a.m. cortisol  Use fair life whole milk (lactose-free).  Mix it with Carnation instant breakfast.  This will provide a good amount of protein minerals and vitamins.  Take it at least twice a day  Office visit with [place] in 4 to 6 weeks.

## 2023-07-11 NOTE — Telephone Encounter (Signed)
 PT calling because she has a job at [employer]. 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 She forgot to ask if she should stop working or not. Right now she works 3 days a week. Her PCP wanted her to ask this of Ms. Alexandria Lodge as well . Her # is [phone] or try [phone]

## 2023-07-11 NOTE — Progress Notes (Signed)
 Attempted call to patient and unable to reach to provide Dr Alford Highland recommendations.   Transmission results reviewed.

## 2023-07-11 NOTE — Progress Notes (Unsigned)
 Primary Care Physician:  Billie Lade, MD Primary Gastroenterologist:  Dr. Jena Gauss  Pre-Procedure History & Physical: HPI:  Erin [name] is a 63 y.o. female here for follow-up weight loss postprandial nausea vomiting abdominal pain.  May eat 2 meals a day.  Sometimes afraid to eat because, nausea and numbing/abdominal pain to follow.  Positive alpha gal.  Reported multiple allergies based on testing.  Record orthostatic vital signs.   Weight down 12 pounds since she was here last year (108 today).  She has had ACTH test previously which was unrevealing.  Fasting a.m. cortisol previously low with a subsequent assay last year that was within normal limits in the 15 range.  Patient states she has not smoked marijuana in 4 months.  Recently diagnosed with non-small cell lung cancer she is undergoing staging and is to see the oncologist in the near future.  Occasionally has diarrhea.  Stool studies previously negative. No dysphagia prior EGD normal (empiric Maloney dilation).  Also, prior colonoscopy with sigmoid biopsies negative for microscopic colitis.  Negative workup in the ED recently for due to "dehydration".  Started on Reglan 10 mg orally 4 times daily.  States its to help with the nausea somewhat but she is now having involuntary movements of her left upper extremity.  GES normal previously.  Past Medical History:  Diagnosis Date   AICD (automatic cardioverter/defibrillator) present    Allergic reaction to alpha-gal    Allergy 03/05/2022   Anemia    Anxiety state 03/25/2016   Arthritis    Basal cell carcinoma of forehead    Brain aneurysm    CHF (congestive heart failure) (HCC)    Coronary artery disease    a. 03/11/16 PCI with DES-->Prox/Mid Cx;  b. 03/14/16 PCI with DES x2-->RCA, EF 30-35%.   Depression 06/09/2010   Encounter for general adult medical examination with abnormal findings 05/04/2022   Essential hypertension    Hx   GERD (gastroesophageal reflux disease)     HFrEF (heart failure with reduced ejection fraction) (HCC)    a. 10/2016 Echo: EF 35-40%, Gr1 DD, mild focal basal septal hypertrophy, basal inflat, mid inflat, basal antlat AK. Mid infept/inf/antlat, apical lateral sev HK. Mod MR. mildly reduced RV fxn. Mild TR.   History of pneumonia    Hyperlipidemia    IBS (irritable bowel syndrome)    Ischemic cardiomyopathy    a. 10/2016 Echo: EF 35-40%, Gr1 DD.   Lung cancer [facility])    Mitral regurgitation    Neuromuscular disorder (HCC)    NSTEMI (non-ST elevated myocardial infarction) (HCC) 03/10/2016   Pneumonia 03/2016   Shingles    Squamous cell cancer of skin of nose    Thrombocytosis 03/26/2016   Tobacco abuse    Trichimoniasis    Wears dentures    Wears glasses     Past Surgical History:  Procedure Laterality Date   APPENDECTOMY     BICEPT TENODESIS Left 03/21/2023   Procedure: BICEPS TENODESIS;  Surgeon: Cammy Copa, MD;  Location: [facility] OR;  Service: Orthopedics;  Laterality: Left;   BIOPSY  09/20/2018   Procedure: BIOPSY;  Surgeon: Corbin Ade, MD;  [facility];  Service: Endoscopy;;  colon   BIOPSY  01/05/2021   Procedure: BIOPSY;  Surgeon: Rachael Fee, MD;  Location: [facility] ENDOSCOPY;  Service: Endoscopy;;   BIOPSY  04/27/2022   Procedure: BIOPSY;  Surgeon: Lanelle Bal, DO;  [facility];  Service: Endoscopy;;   BRONCHIAL  BIOPSY  07/03/2023   Procedure: BRONCHIAL BIOPSIES;  Surgeon: Leslye Peer, MD;  Location: [facility] ENDOSCOPY;  Service: Pulmonary;;   BRONCHIAL BRUSHINGS  07/03/2023   Procedure: BRONCHIAL BRUSHINGS;  Surgeon: Leslye Peer, MD;  Location: [facility] ENDOSCOPY;  Service: Pulmonary;;   BRONCHIAL NEEDLE ASPIRATION BIOPSY  07/03/2023   Procedure: BRONCHIAL NEEDLE ASPIRATION BIOPSIES;  Surgeon: Leslye Peer, MD;  Location: [facility] ENDOSCOPY;  Service: Pulmonary;;   BRONCHIAL WASHINGS  07/03/2023   Procedure: BRONCHIAL WASHINGS;  Surgeon: Leslye Peer, MD;  Location: [facility] ENDOSCOPY;   Service: Pulmonary;;   CARDIAC CATHETERIZATION N/A 03/11/2016   Procedure: Left Heart Cath and Coronary Angiography;  Surgeon: Marykay Lex, MD;  Location: [facility] INVASIVE CV LAB;  Service: Cardiovascular;  Laterality: N/A;   CARDIAC CATHETERIZATION N/A 03/11/2016   Procedure: Coronary Stent Intervention;  Surgeon: Marykay Lex, MD;  Location: [facility] INVASIVE CV LAB;  Service: Cardiovascular;  Laterality: N/A;   CARDIAC CATHETERIZATION N/A 03/14/2016   Procedure: Coronary Stent Intervention;  Surgeon: Peter M Swaziland, MD;  Location: [facility] INVASIVE CV LAB;  Service: Cardiovascular;  Laterality: N/A;   CEREBRAL ANEURYSM REPAIR  04/2019   stent placed   CHOLECYSTECTOMY OPEN  1984   COLONOSCOPY WITH PROPOFOL N/A 09/20/2018   Procedure: COLONOSCOPY WITH PROPOFOL;  Surgeon: Corbin Ade, MD;  [facility];  Service: Endoscopy;  Laterality: N/A;  10:30am   CORONARY ANGIOPLASTY WITH STENT PLACEMENT  03/14/2016   ESOPHAGOGASTRODUODENOSCOPY (EGD) WITH PROPOFOL N/A 09/20/2018   Procedure: ESOPHAGOGASTRODUODENOSCOPY (EGD) WITH PROPOFOL;  Surgeon: Corbin Ade, MD;  [facility];  Service: Endoscopy;  Laterality: N/A;   ESOPHAGOGASTRODUODENOSCOPY (EGD) WITH PROPOFOL N/A 01/05/2021   Procedure: ESOPHAGOGASTRODUODENOSCOPY (EGD) WITH PROPOFOL;  Surgeon: Rachael Fee, MD;  Location: [facility] ENDOSCOPY;  Service: Endoscopy;  Laterality: N/A;   ESOPHAGOGASTRODUODENOSCOPY (EGD) WITH PROPOFOL N/A 04/27/2022   Procedure: ESOPHAGOGASTRODUODENOSCOPY (EGD) WITH PROPOFOL;  Surgeon: Lanelle Bal, DO;  [facility];  Service: Endoscopy;  Laterality: N/A;  9:15am, asa 3/4, ASAP   ESOPHAGOGASTRODUODENOSCOPY (EGD) WITH PROPOFOL N/A 01/04/2023   Procedure: ESOPHAGOGASTRODUODENOSCOPY (EGD) WITH PROPOFOL;  Surgeon: Corbin Ade, MD;  [facility];  Service: Endoscopy;  Laterality: N/A;  130pm, asa 3   FIDUCIAL MARKER PLACEMENT  07/03/2023   Procedure: FIDUCIAL MARKER PLACEMENT;   Surgeon: Leslye Peer, MD;  Location: [facility] ENDOSCOPY;  Service: Pulmonary;;   FINGER ARTHROPLASTY Left 05/14/2013   Procedure: LEFT THUMB CARPAL METACARPAL ARTHROPLASTY;  Surgeon: Tami Ribas, MD;  Location: [facility];  Service: Orthopedics;  Laterality: Left;   ICD IMPLANT N/A 04/03/2020   Procedure: ICD IMPLANT;  Surgeon: Regan Lemming, MD;  Location: [facility] INVASIVE CV LAB;  Service: Cardiovascular;  Laterality: N/A;   IR ANGIO INTRA EXTRACRAN SEL COM CAROTID INNOMINATE BILAT MOD SED  01/05/2017   IR ANGIO INTRA EXTRACRAN SEL COM CAROTID INNOMINATE BILAT MOD SED  03/19/2019   IR ANGIO INTRA EXTRACRAN SEL COM CAROTID INNOMINATE BILAT MOD SED  06/04/2020   IR ANGIO INTRA EXTRACRAN SEL INTERNAL CAROTID UNI L MOD SED  04/15/2019   IR ANGIO VERTEBRAL SEL VERTEBRAL BILAT MOD SED  01/05/2017   IR ANGIO VERTEBRAL SEL VERTEBRAL BILAT MOD SED  03/19/2019   IR ANGIO VERTEBRAL SEL VERTEBRAL UNI L MOD SED  06/04/2020   IR ANGIOGRAM FOLLOW UP STUDY  04/15/2019   IR RADIOLOGIST EVAL & MGMT  12/30/2016   IR TRANSCATH/EMBOLIZ  04/15/2019   IR US GUIDE VASC  ACCESS RIGHT  03/19/2019   IR US GUIDE VASC ACCESS RIGHT  06/04/2020   JOINT REPLACEMENT     MALONEY DILATION N/A 09/20/2018   Procedure: Elease Hashimoto DILATION;  Surgeon: Corbin Ade, MD;  [facility];  Service: Endoscopy;  Laterality: N/A;   MALONEY DILATION N/A 01/04/2023   Procedure: Elease Hashimoto DILATION;  Surgeon: Corbin Ade, MD;  [facility];  Service: Endoscopy;  Laterality: N/A;   RADIOLOGY WITH ANESTHESIA N/A 04/15/2019   Procedure: Sharman Crate;  Surgeon: Julieanne Cotton, MD;  Location: [facility] OR;  Service: Radiology;  Laterality: N/A;   REVERSE SHOULDER ARTHROPLASTY Left 03/21/2023   Procedure: LEFT REVERSE SHOULDER ARTHROPLASTY;  Surgeon: Cammy Copa, MD;  Location: [facility] OR;  Service: Orthopedics;  Laterality: Left;   RIGHT/LEFT HEART CATH AND CORONARY ANGIOGRAPHY N/A 08/19/2019   Procedure:  RIGHT/LEFT HEART CATH AND CORONARY ANGIOGRAPHY;  Surgeon: Laurey Morale, MD;  Location: [facility] INVASIVE CV LAB;  Service: Cardiovascular;  Laterality: N/A;   TUBAL LIGATION  1987   VAGINAL HYSTERECTOMY  2009    Prior to Admission medications   Medication Sig Start Date End Date Taking? Authorizing Provider  aspirin EC 81 MG tablet Take 1 tablet (81 mg total) by mouth every evening. Okay to restart on 2/29/2025 07/03/23  Yes Leslye Peer, MD  atorvastatin (LIPITOR) 80 MG tablet Take 1 tablet (80 mg total) by mouth daily. 09/05/22  Yes Laurey Morale, MD  clopidogrel (PLAVIX) 75 MG tablet Take 1 tablet (75 mg total) by mouth daily. Okay to restart on 07/04/2023 07/03/23  Yes Byrum, Les Pou, MD  dapagliflozin propanediol (FARXIGA) 10 MG TABS tablet Take 1 tablet (10 mg total) by mouth daily before breakfast. 09/12/22  Yes Milford, Anderson Malta, FNP  EPINEPHrine (EPIPEN 2-PAK) 0.3 mg/0.3 mL IJ SOAJ injection Inject 0.3 mg into the muscle as needed for anaphylaxis. 04/23/21  Yes Alfonse Spruce, MD  Evolocumab (REPATHA SURECLICK) 140 MG/ML SOAJ Inject 140 mg into the skin every 14 (fourteen) days. 09/07/22  Yes Laurey Morale, MD  fenofibrate (TRICOR) 145 MG tablet Take 1 tablet (145 mg total) by mouth daily. 07/27/22  Yes Laurey Morale, MD  furosemide (LASIX) 20 MG tablet Take 2 tablets (40 mg total) by mouth 2 (two) times daily. 07/11/23 10/09/23 Yes Laurey Morale, MD  gabapentin (NEURONTIN) 300 MG capsule Take 1 capsule by mouth at bedtime 06/06/23  Yes Billie Lade, MD  hydrOXYzine (VISTARIL) 25 MG capsule Take 1 capsule (25 mg total) by mouth every 8 (eight) hours as needed. 04/20/23  Yes Gilmore Laroche, FNP  linaclotide [facility]) 145 MCG CAPS capsule Take 1 capsule (145 mcg total) by mouth daily before breakfast. 11/22/22  Yes Kyoko Elsea, Gerrit Friends, MD  loperamide (IMODIUM A-D) 2 MG tablet Take 2 mg by mouth 4 (four) times daily as needed for diarrhea or loose stools.   Yes [provider]  losartan (COZAAR) 25 MG tablet Take 0.5 tablets (12.5 mg total) by mouth at bedtime. 03/28/23  Yes Laurey Morale, MD  MAGNESIUM-OXIDE 400 (240 Mg) MG tablet Take 1 tablet by mouth daily. 07/08/23  Yes [provider]  metoprolol succinate (TOPROL XL) 25 MG 24 hr tablet Take 0.5 tablets (12.5 mg total) by mouth daily. 07/03/23  Yes Leslye Peer, MD  Multiple Vitamins-Minerals (MULTIVITAMIN WITH MINERALS) tablet Take 1 tablet by mouth daily.   Yes [provider]  nitroGLYCERIN (NITROSTAT) 0.4 MG SL tablet Place 1 tablet (0.4  mg total) under the tongue every 5 (five) minutes x 3 doses as needed for chest pain (if no relief after 2nd dose, proceed to the ED for an evaluation or call 911). 09/19/22  Yes Laurey Morale, MD  pantoprazole (PROTONIX) 40 MG tablet Take 40 mg by mouth 2 (two) times daily. 07/08/23  Yes [provider]  penicillin v potassium (VEETID) 500 MG tablet Take 1 tablet (500 mg total) by mouth 4 (four) times daily. 07/11/23  Yes Bevelyn Ngo, NP  potassium chloride SA (KLOR-CON M) 20 MEQ tablet Take 2 tablets (40 mEq total) by mouth 2 (two) times daily. 04/25/23  Yes Milford, Anderson Malta, FNP  rOPINIRole (REQUIP) 0.5 MG tablet Take 0.5 mg by mouth at bedtime.  11/13/17  Yes [provider]  sertraline (ZOLOFT) 50 MG tablet Take 1 tablet (50 mg total) by mouth daily. 06/01/23  Yes Billie Lade, MD  spironolactone (ALDACTONE) 25 MG tablet TAKE 1 TABLET BY MOUTH AT BEDTIME 07/05/23  Yes Laurey Morale, MD  traZODone (DESYREL) 50 MG tablet Take 1 tablet (50 mg total) by mouth at bedtime. 06/01/23  Yes Billie Lade, MD  varenicline (CHANTIX) 1 MG tablet Take 1 tablet (1 mg total) by mouth 2 (two) times daily. 09/12/22  Yes Milford, Anderson Malta, FNP  Vitamin D, Ergocalciferol, (DRISDOL) 1.25 MG (50000 UNIT) CAPS capsule Take 50,000 Units by mouth every 7 (seven) days.   Yes [provider]  metoCLOPramide (REGLAN) 10 MG tablet Take 1 tablet  (10 mg total) by mouth every 6 (six) hours. Patient not taking: Reported on 07/11/2023 06/18/23   Burgess Amor, PA-C    Allergies as of 07/11/2023 - Review Complete 07/11/2023  Allergen Reaction Noted   Alpha-gal Shortness Of Breath, Nausea And Vomiting, and Dermatitis 02/28/2022   Other Shortness Of Breath, Diarrhea, Nausea And Vomiting, and Nausea Only 03/02/2021   Tape Other (See Comments) 01/03/2017    Family History  Problem Relation Age of Onset   Stroke Mother    Hypertension Mother    Diabetes Mother    Heart attack Mother    Heart attack Father    Diabetes Father    Hypertension Father    CAD Father    Colon polyps Father 59       pre-cancerous    Stroke Father    Dementia Father    Hyperlipidemia Father    Arthritis Father    COPD Father        smoked and worked Archivist mills   Heart disease Father    Breast cancer Maternal Grandmother    Diabetes Maternal Grandmother    Cancer Maternal Grandfather        Tongue and esophageal   Cancer Paternal Grandmother    Anxiety disorder Daughter    Depression Daughter    Anxiety disorder Daughter    Heart failure Other    Colon cancer Neg Hx     Social History   Socioeconomic History   Marital status: Married    Spouse name: Not on file   Number of children: Not on file   Years of education: Not on file   Highest education level: Associate degree: occupational, Scientist, product/process development, or vocational program  Occupational History   Occupation: CNA  Tobacco Use   Smoking status: Former    Current packs/day: 1.50    Average packs/day: 1.5 packs/day for 40.0 years (60.0 ttl pk-yrs)    Types: Cigarettes   Smokeless tobacco: Never  Tobacco comments:    Smokes off and on 06/14/23 [name], CMA     No cigarettes since Thursday this week>> 07/06/2023  Vaping Use   Vaping status: Never Used  Substance and Sexual Activity   Alcohol use: Not Currently    Comment: occasionally   Drug use: Not Currently    Types: Marijuana    Sexual activity: Not Currently    Birth control/protection: Surgical    Comment: hyst  Other Topics Concern   Not on file  Social History Narrative   Lives with husband in [place] in a one story home with a basement.  Has 4 children.  Works as a Lawyer.  Education: CNA school.    Social Drivers of Corporate investment banker Strain: Low Risk  (04/20/2023)   Overall Financial Resource Strain (CARDIA)    Difficulty of Paying Living Expenses: Not hard at all  Food Insecurity: No Food Insecurity (04/20/2023)   Hunger Vital Sign    Worried About Running Out of Food in the Last Year: Never true    Ran Out of Food in the Last Year: Never true  Transportation Needs: No Transportation Needs (04/20/2023)   PRAPARE - Administrator, Civil Service (Medical): No    Lack of Transportation (Non-Medical): No  Physical Activity: Insufficiently Active (04/20/2023)   Exercise Vital Sign    Days of Exercise per Week: 3 days    Minutes of Exercise per Session: 30 min  Stress: Stress Concern Present (04/20/2023)   [employer] of Occupational Health - Occupational Stress Questionnaire    Feeling of Stress : Very much  Social Connections: Socially Isolated (04/20/2023)   Social Connection and Isolation Panel [NHANES]    Frequency of Communication with Friends and Family: Once a week    Frequency of Social Gatherings with Friends and Family: Once a week    Attends Religious Services: Never    Database administrator or Organizations: No    Attends Engineer, structural: Not on file    Marital Status: Married  Catering manager Violence: Not At Risk (03/21/2023)   Humiliation, Afraid, Rape, and Kick questionnaire    Fear of Current or Ex-Partner: No    Emotionally Abused: No    Physically Abused: No    Sexually Abused: No    Review of Systems: See HPI, otherwise negative ROS  Physical Exam: BP (!) 97/55 (BP Location: Right Arm, Patient Position: Sitting, Cuff Size: Normal)    Pulse 88   Temp 98 F (36.7 C) (Oral)   Ht 5' (1.524 m)   Wt 108 lb 6.4 oz (49.2 kg)   SpO2 98%   BMI 21.17 kg/m  General:   Alert, somewhat frail pleasant and cooperative in NAD Lungs:  Clear throughout to auscultation.   No wheezes, crackles, or rhonchi. No acute distress. Heart:  Regular rate and rhythm; no murmurs, clicks, rubs,  or gallops. Abdomen: Non-distended, normal bowel sounds.  Soft and nontender without appreciable mass or hepatosplenomegaly.   Impression/Plan: 63 year old lady with nausea vomiting vague postprandial abdominal pain weight loss failure to thrive recently diagnosed with non-small cell squamous cell carcinoma of the lung.  Workup in progress.  History of recurrent orthostatic vital signs/dehydration.  Concerns about adrenal insufficiency without definitive diagnosis being made.  Previous concern for cannabinoid hyperemesis syndrome.  She states no cannabis in 4 months.  Prior EGD and colonoscopy as outlined.  CT negative.  New onset dystonia symptoms the addition of Reglan to  her regimen.  Needs a celiac screen.  Mesenteric ischemia needs to be ruled out.   Recommendations   For now continue pantoprazole 40 mg twice daily-best taken before supper and breakfast  Because your left forearm is shaking, stop taking metoclopramide and never take it again.  We will add that to your allergy list.  New prescription for Zofran 4 mg ODT tablets (1 under the tongue every 6 hours as needed for nausea.  Dispense 60 with 3 refills.  CTA abdomen to rule out mesenteric ischemia  Celiac screen TTG IgA and total serum IgA level  Fasting a.m. cortisol  Use fair life whole milk (lactose-free).  Mix it with Carnation instant breakfast.  This will provide a good amount of protein minerals and  vitamins.  Take it at least twice a day     Notice: This dictation was prepared with Dragon dictation along with smaller phrase technology. Any transcriptional errors that  result from this process are unintentional and may not be corrected upon review.

## 2023-07-12 ENCOUNTER — Telehealth (HOSPITAL_COMMUNITY): Payer: Self-pay | Admitting: Cardiology

## 2023-07-12 DIAGNOSIS — I5022 Chronic systolic (congestive) heart failure: Secondary | ICD-10-CM

## 2023-07-12 NOTE — Telephone Encounter (Signed)
 I called and spoke to pt. 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 Please advise.

## 2023-07-12 NOTE — Progress Notes (Incomplete)
 ADVANCED HF CLINIC NOTE   PCP: Erin Lade, MD HF Cardiology: Erin [name]  CC: HF follow up  Erin [name] is a 63 y.o. with history of CAD, ischemic cardiomyopathy, and PAD was referred by Dr. Darl [name] for evaluation of CHF.  She had NSTEMI in 11/17 with PCI to prox/mid LCx and RCA.  Subsequently, has developed ischemic cardiomyopathy.  Most recent echo in 1/21 showed EF 30-35%.  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   RHC/LHC done with exertional chest heaviness and dyspnea in 4/21 showed normal filling pressures, preserved cardiac output, and nonobstructive mild CAD.  ABIs were normal in 4/21.   In 8/21, she had syncope thought to be related to orthostasis from low BP in the setting of cardiac medication titration.  Entresto was decreased.   She was in the ER in 11/21 with chest pain.  Troponin and COVID-19 negative. Echo in 11/21 showed EF 30-35%, normal RV.   She was hospitalized in 6/22 with left-sided weakness/numbness.  No evidence for acute CVA, ?TIA.    Echo in 11/22 showed EF 30-35% with mild LV dilation, normal RV, mild-moderate MR, IVC normal.   Echo 2/24 showed EF remains 30-35% with normal RV.   Patient had left shoulder replacement in 11/24.   Presented to the ED 2/25 with dizziness and headache. Reported SBP at home in the 70s. Unfortunately she left before being seen.   Today she returns for AHF follow up. Overall feeling ***. Denies palpitations, CP, dizziness, edema, or PND/Orthopnea. *** SOB. Appetite ok. No fever or chills. Weight at home *** pounds. Taking all medications. Denies ETOH, tobacco or drug use.  Smoking "off and on", had 1 cigarette today but a pack will last 2 weeks. Wearing nicotine patches, stopped Chantix due to nightmares. Works as Conservation officer, nature at [employer]. BP at home 92/58 yesterday.  Medtronic device interrogation (personally reviewed): OptiVol down, thoracic impedence at baseline, 3 episodes  of NSVT,  no AF, 100% VP, 1.6 hr/day activity. ***  ECG (personally reviewed): none ordered today  PMH: 1. CAD: NSTEMI with DES to proximal and mid LCx in 11/17, staged DES x 2 to RCA later in 11/17.  - Cardiolite (10/20): EF < 30%, large inferior and inferolateral MI with mild peri-infarct ischemia.  - LHC (4/21): Patent stents, nonobstructive CAD.  2. Chronic systolic CHF: Ischemic cardiomyopathy. Medtronic ICD.  - Echo (10/20): EF 35-40%, lateral WMAs, normal RV. - Echo (1/21): EF 30-35%, mild LVH, normal RV - RHC (4/21): mean RA 5, PA 29/3, mean PCWP 12, CI 3.04 - Echo (11/21): EF 30-35%, normal RV.  - Echo (11/22): EF 30-35% with mild LV dilation, normal RV, mild-moderate MR, IVC normal.  - Echo (2/24): EF 30-35%, normal RV.  3. 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  4. Carotid stenosis: Carotid dopplers (10/20) with 60-79% LICA stenosis.  - Carotid dopplers (6/21): 40-59% BICA stenosis.  - Carotid dopplers (1/22): 40-59% RICA, 60-79% LICA.  - Carotid dopplers (4/23): 40-59% LICA - Carotid dopplers (12/23): 40-59% LICA - Carotid dopplers (12/24): 40-59% LICA stenosis 5. Prior smoker.  6. PAD: ABIs (4/21) Normal.  - Peripheral artery dopplers (12/21): bilateral plaque without focal stenosis.  7. Left shoulder replacement 11/24  Social History   Socioeconomic History   Marital status: Married    Spouse name: Not on file   Number of children: Not on file   Years of education: Not on file   Highest  education level: Associate degree: occupational, Scientist, product/process development, or vocational program  Occupational History   Occupation: CNA  Tobacco Use   Smoking status: Former    Current packs/day: 1.50    Average packs/day: 1.5 packs/day for 40.0 years (60.0 ttl pk-yrs)    Types: Cigarettes   Smokeless tobacco: Never   Tobacco comments:    Smokes off and on 06/14/23 [name], CMA     No cigarettes since Thursday this week>> 07/06/2023  Vaping Use    Vaping status: Never Used  Substance and Sexual Activity   Alcohol use: Not Currently    Comment: occasionally   Drug use: Not Currently    Types: Marijuana   Sexual activity: Not Currently    Birth control/protection: Surgical    Comment: hyst  Other Topics Concern   Not on file  Social History Narrative   Lives with husband in [place] in a one story home with a basement.  Has 4 children.  Works as a Lawyer.  Education: CNA school.    Social Drivers of Corporate investment banker Strain: Low Risk  (04/20/2023)   Overall Financial Resource Strain (CARDIA)    Difficulty of Paying Living Expenses: Not hard at all  Food Insecurity: No Food Insecurity (04/20/2023)   Hunger Vital Sign    Worried About Running Out of Food in the Last Year: Never true    Ran Out of Food in the Last Year: Never true  Transportation Needs: No Transportation Needs (04/20/2023)   PRAPARE - Administrator, Civil Service (Medical): No    Lack of Transportation (Non-Medical): No  Physical Activity: Insufficiently Active (04/20/2023)   Exercise Vital Sign    Days of Exercise per Week: 3 days    Minutes of Exercise per Session: 30 min  Stress: Stress Concern Present (04/20/2023)   [employer] of Occupational Health - Occupational Stress Questionnaire    Feeling of Stress : Very much  Social Connections: Socially Isolated (04/20/2023)   Social Connection and Isolation Panel [NHANES]    Frequency of Communication with Friends and Family: Once a week    Frequency of Social Gatherings with Friends and Family: Once a week    Attends Religious Services: Never    Database administrator or Organizations: No    Attends Engineer, structural: Not on file    Marital Status: Married  Catering manager Violence: Not At Risk (03/21/2023)   Humiliation, Afraid, Rape, and Kick questionnaire    Fear of Current or Ex-Partner: No    Emotionally Abused: No    Physically Abused: No    Sexually Abused: No    Family History  Problem Relation Age of Onset   Stroke Mother    Hypertension Mother    Diabetes Mother    Heart attack Mother    Heart attack Father    Diabetes Father    Hypertension Father    CAD Father    Colon polyps Father 74       pre-cancerous    Stroke Father    Dementia Father    Hyperlipidemia Father    Arthritis Father    COPD Father        smoked and worked Archivist mills   Heart disease Father    Breast cancer Maternal Grandmother    Diabetes Maternal Grandmother    Cancer Maternal Grandfather        Tongue and esophageal   Cancer Paternal Grandmother    Anxiety disorder Daughter  Depression Daughter    Anxiety disorder Daughter    Heart failure Other    Colon cancer Neg Hx    ROS: All systems reviewed and negative except as per HPI.   Current Outpatient Medications  Medication Sig Dispense Refill   aspirin EC 81 MG tablet Take 1 tablet (81 mg total) by mouth every evening. Okay to restart on 2/29/2025     atorvastatin (LIPITOR) 80 MG tablet Take 1 tablet (80 mg total) by mouth daily. 90 tablet 3   clopidogrel (PLAVIX) 75 MG tablet Take 1 tablet (75 mg total) by mouth daily. Okay to restart on 07/04/2023     dapagliflozin propanediol (FARXIGA) 10 MG TABS tablet Take 1 tablet (10 mg total) by mouth daily before breakfast. 30 tablet 11   EPINEPHrine (EPIPEN 2-PAK) 0.3 mg/0.3 mL IJ SOAJ injection Inject 0.3 mg into the muscle as needed for anaphylaxis. 2 each 2   Evolocumab (REPATHA SURECLICK) 140 MG/ML SOAJ Inject 140 mg into the skin every 14 (fourteen) days. 6 mL 3   fenofibrate (TRICOR) 145 MG tablet Take 1 tablet (145 mg total) by mouth daily. 90 tablet 3   furosemide (LASIX) 20 MG tablet Take 2 tablets (40 mg total) by mouth 2 (two) times daily. 360 tablet 3   gabapentin (NEURONTIN) 300 MG capsule Take 1 capsule by mouth at bedtime 30 capsule 0   hydrOXYzine (VISTARIL) 25 MG capsule Take 1 capsule (25 mg total) by mouth every 8 (eight) hours as needed.  30 capsule 1   linaclotide (LINZESS) 145 MCG CAPS capsule Take 1 capsule (145 mcg total) by mouth daily before breakfast. 30 capsule 11   loperamide (IMODIUM A-D) 2 MG tablet Take 2 mg by mouth 4 (four) times daily as needed for diarrhea or loose stools.     losartan (COZAAR) 25 MG tablet Take 0.5 tablets (12.5 mg total) by mouth at bedtime.     MAGNESIUM-OXIDE 400 (240 Mg) MG tablet Take 1 tablet by mouth daily.     metoCLOPramide (REGLAN) 10 MG tablet Take 1 tablet (10 mg total) by mouth every 6 (six) hours. (Patient not taking: Reported on 07/11/2023) 30 tablet 0   metoprolol succinate (TOPROL XL) 25 MG 24 hr tablet Take 0.5 tablets (12.5 mg total) by mouth daily.     Multiple Vitamins-Minerals (MULTIVITAMIN WITH MINERALS) tablet Take 1 tablet by mouth daily.     nitroGLYCERIN (NITROSTAT) 0.4 MG SL tablet Place 1 tablet (0.4 mg total) under the tongue every 5 (five) minutes x 3 doses as needed for chest pain (if no relief after 2nd dose, proceed to the ED for an evaluation or call 911). 25 tablet 2   pantoprazole (PROTONIX) 40 MG tablet Take 40 mg by mouth 2 (two) times daily.     penicillin v potassium (VEETID) 500 MG tablet Take 1 tablet (500 mg total) by mouth 4 (four) times daily. 20 tablet 0   potassium chloride SA (KLOR-CON M) 20 MEQ tablet Take 2 tablets (40 mEq total) by mouth 2 (two) times daily. 120 tablet 6   rOPINIRole (REQUIP) 0.5 MG tablet Take 0.5 mg by mouth at bedtime.   0   sertraline (ZOLOFT) 50 MG tablet Take 1 tablet (50 mg total) by mouth daily. 30 tablet 3   spironolactone (ALDACTONE) 25 MG tablet TAKE 1 TABLET BY MOUTH AT BEDTIME 90 tablet 1   traZODone (DESYREL) 50 MG tablet Take 1 tablet (50 mg total) by mouth at bedtime. 90 tablet 1  varenicline (CHANTIX) 1 MG tablet Take 1 tablet (1 mg total) by mouth 2 (two) times daily. 60 tablet 3   Vitamin D, Ergocalciferol, (DRISDOL) 1.25 MG (50000 UNIT) CAPS capsule Take 50,000 Units by mouth every 7 (seven) days.     No current  facility-administered medications for this visit.   Wt Readings from Last 3 Encounters:  07/11/23 49.2 kg (108 lb 6.4 oz)  07/11/23 48.8 kg (107 lb 9.6 oz)  07/03/23 46.7 kg (103 lb)   There were no vitals taken for this visit.  Orthostatics 05/26/23 Lying: 119/80, 89 Sitting: 119/76, 91 Standing: 117/80, 96  Physical Exam General:  *** appearing.  No respiratory difficulty HEENT: normal Neck: supple. JVD *** cm.  Cor: PMI nondisplaced. Regular rate & rhythm. No rubs, gallops or murmurs. Lungs: clear Abdomen: soft, nontender, nondistended. Good bowel sounds. Extremities: no cyanosis, clubbing, rash, edema  Neuro: alert & oriented x 3. Moves all 4 extremities w/o difficulty. Affect pleasant.   Assessment/Plan: 1. CAD: S/p NSTEMI in 11/17 with DEX to LCx and DES x 2 to RCA.  Cardiolite in 10/20 with EF <30%, inferior/inferolateral infarct with peri-infarct ischemia. LHC in 4/21 showed nonobstructive mild CAD.  No chest pain.  - Off [name] 2/2 AlphaGal.  - Continue atorvastatin + fenofibrate + Repatha. Good lipids in 6/24.  - Continue ASA 81 and Plavix 75 mg daily for now.  She will continue Plavix as long as Dr. Corliss Skains feels it is needed post her cranial circulation intervention.  2. Chronic systolic CHF: Ischemic cardiomyopathy.  RHC in 4/21 with normal filling pressures and preserved cardiac output.  Echo 2/24 showed stable EF 30-35%. NYHA II symptoms. She is not volume overloaded by OptiVol, may be on the dry side. - Orthostatics in clinic today are negative. - With what sounds like orthostasis at home, decrease Lasix to 40/20. BMET and BNP today. - Continue Farixga 10 mg daily.   - Continue losartan 12.5 mg at bedtime (unable to take Entresto in past due to hypotension).  - Continue Toprol XL 12.5 mg at bedtime. Will not increase today with soft BP at home. - Continue spironolactone 12.5 mg at bedtime.  (Recently cut back) - I asked her to check her BP at home and log. Notify  clinic if sBP < 95  - Due to repeat echo. *** 3. Carotid stenosis: Carotid dopplers 12/24 showed 40-59% LICA stenosis - Repeat in 1 year 4. Smoking: She quit in 10/24, but now restarted. - Discussed cessation, she is wearing nicotine patches. 5. 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  - Continue Plavix per Dr. Corliss Skains.   Follow up in 3 months with Dr. Thressa Sheller AGACNP-BC  07/12/2023

## 2023-07-12 NOTE — Telephone Encounter (Signed)
 I think it is safe for her to work. I recommend that she wear a mask if there is any exposure, dust included, that contributes to her breathing symptoms.

## 2023-07-12 NOTE — Telephone Encounter (Signed)
 Will forward to Dr. Delton Coombes. I have not evaluated patient. Would advise she wear a mask while working.

## 2023-07-12 NOTE — Telephone Encounter (Signed)
 Pt left VM on triage line with a request to return call

## 2023-07-13 ENCOUNTER — Other Ambulatory Visit: Payer: Self-pay

## 2023-07-13 NOTE — Telephone Encounter (Signed)
 I called and spoke with the pt and notified of response per Dr Delton Coombes  She verbalized understanding  She states that she quit her job this morning, because they told her she was not able to wear mask at work  She says that they told her that the customers would consider her "sick with covid" and therefore no mask could be worn  Nothing further needed

## 2023-07-14 MED ORDER — SPIRONOLACTONE 25 MG PO TABS: 12.5000 mg | ORAL_TABLET | Freq: Every day | ORAL | Status: DC

## 2023-07-14 MED ORDER — METOPROLOL SUCCINATE ER 25 MG PO TB24: 12.5000 mg | ORAL_TABLET | Freq: Every evening | ORAL | Status: DC

## 2023-07-14 MED ORDER — LOSARTAN POTASSIUM 25 MG PO TABS: 12.5000 mg | ORAL_TABLET | Freq: Every day | ORAL | Status: DC

## 2023-07-14 NOTE — Telephone Encounter (Signed)
 Dr. Shirlee Latch gave recommendations on 03/03. Were those changes ever relayed to patient?

## 2023-07-14 NOTE — Telephone Encounter (Signed)
 Pt aware of recommendations from Dr Shirlee Latch  Decrease spironolactone to 12.5 mg and take at bedtime.  Make sure to take Toprol XL 12.5 at bedtime and losartan 12.5 at bedtime.       Med list updated

## 2023-07-14 NOTE — Telephone Encounter (Signed)
 Patient left another VM on triage line reporting ER provider stopped losartan and metoprolol, would like to know if cardiology agrees  Note seen in ER for orthostatic dizziness 3/1   Returned call to patient Reports dizziness has gotten better  -pt did not stop medications as she wanted medication changes to come from cardiology -no vitals to report (Was able to check b/p while on the phone 78/46)  Currently scheduled for Follow up 3/11   Please advise

## 2023-07-15 NOTE — Progress Notes (Signed)
 The proposed treatment discussed in conference is for discussion purpose only and is not a binding recommendation.  The patients have not been physically examined, or presented with their treatment options.  Therefore, final treatment plans cannot be decided.

## 2023-07-16 LAB — IGA: IgA/Immunoglobulin A, Serum: 176 mg/dL (ref 87–352)

## 2023-07-16 LAB — TISSUE TRANSGLUTAMINASE, IGA: Transglutaminase IgA: <2 U/mL (ref 0–3)

## 2023-07-16 LAB — CORTISOL-AM, BLOOD: Cortisol - AM: 21.5 ug/dL — ABNORMAL HIGH (ref 6.2–19.4)

## 2023-07-17 ENCOUNTER — Encounter

## 2023-07-17 ENCOUNTER — Encounter: Payer: Self-pay | Admitting: Student

## 2023-07-17 ENCOUNTER — Ambulatory Visit: Attending: Student | Admitting: Student

## 2023-07-17 VITALS — BP 102/72 | HR 72 | Ht 60.0 in | Wt 105.0 lb

## 2023-07-17 DIAGNOSIS — I2510 Atherosclerotic heart disease of native coronary artery without angina pectoris: Secondary | ICD-10-CM | POA: Diagnosis not present

## 2023-07-17 DIAGNOSIS — I5022 Chronic systolic (congestive) heart failure: Secondary | ICD-10-CM | POA: Diagnosis not present

## 2023-07-17 DIAGNOSIS — Z95810 Presence of automatic (implantable) cardiac defibrillator: Secondary | ICD-10-CM

## 2023-07-17 DIAGNOSIS — Z720 Tobacco use: Secondary | ICD-10-CM | POA: Diagnosis not present

## 2023-07-17 LAB — CUP PACEART INCLINIC DEVICE CHECK
Battery Remaining Longevity: 103 mo
Battery Voltage: 3.01 V
Brady Statistic RV Percent Paced: 0 %
Date Time Interrogation Session: 20250310110715
HighPow Impedance: 75 Ohm
Implantable Lead Connection Status: 753985
Implantable Lead Implant Date: 20211126
Implantable Lead Location: 753860
Implantable Pulse Generator Implant Date: 20211126
Lead Channel Impedance Value: 456 Ohm
Lead Channel Impedance Value: 589 Ohm
Lead Channel Pacing Threshold Amplitude: 0.625 V
Lead Channel Pacing Threshold Pulse Width: 0.4 ms
Lead Channel Sensing Intrinsic Amplitude: 5.125 mV
Lead Channel Sensing Intrinsic Amplitude: 5.875 mV
Lead Channel Setting Pacing Amplitude: 2 V
Lead Channel Setting Pacing Pulse Width: 0.4 ms
Lead Channel Setting Sensing Sensitivity: 0.3 mV
Zone Setting Status: 755011
Zone Setting Status: 755011

## 2023-07-17 NOTE — Progress Notes (Signed)
  Electrophysiology Office Note:   ID:  Erin, [name] August 28, 1960, MRN [id number]  Primary Cardiologist: Marca Ancona, MD Electrophysiologist: Will Jorja Loa, MD      History of Present Illness:   Erin [name] is a 63 y.o. female with h/o chronic systolic CHF s/p ICD, CAD, and tobacco abuse seen today for routine electrophysiology followup.   Since last being seen in our clinic the patient reports doing OK from an EP perspective. She has had some low BP and med adjustments recently. Follows up with HF clinic tomorrow. Denies syncope, chest pain, or new SOB. Overall does her ADLs OK.   Review of systems complete and found to be negative unless listed in HPI.   EP Information / Studies Reviewed:    EKG is not ordered today. EKG from 04/15/2023 reviewed which showed NSR at 98 bpm       ICD Interrogation-  reviewed in detail today,  See PACEART report.  Arrhythmia/Device History Medtronic Single Chamber ICD 04/03/2020 for CHF   Physical Exam:   VS:  BP 102/72   Pulse 72   Ht 5' (1.524 m)   Wt 105 lb (47.6 kg)   BMI 20.51 kg/m    Wt Readings from Last 3 Encounters:  07/17/23 105 lb (47.6 kg)  07/11/23 108 lb 6.4 oz (49.2 kg)  07/11/23 107 lb 9.6 oz (48.8 kg)     GEN: No acute distress  NECK: No JVD; No carotid bruits CARDIAC: Regular rate and rhythm, no murmurs, rubs, gallops RESPIRATORY:  Clear to auscultation without rales, wheezing or rhonchi  ABDOMEN: Soft, non-tender, non-distended EXTREMITIES:  No edema; No deformity   ASSESSMENT AND PLAN:    Chronic systolic CHF  s/p Medtronic single chamber ICD  euvolemic today Stable on an appropriate medical regimen Normal ICD function See Pace Art report No changes today Recent labs stable.  NYHA II-III symptoms, states apart from hypotension she is tolerating ADLs and slightly more exertion currently. Would be a good candidate for small device therapy down the road pending course (BAT vs CCM). Would consider  updating echo and getting pro-BNP at HF visit coming up if agree that she is a good candidate.   CAD  Denies s/s ischemia  Tobacco abuse Remains off  Disposition:   Follow up with EP APP in 12 months, sooner if to consider BAT or CCM.    Signed, Graciella Freer, PA-C

## 2023-07-18 ENCOUNTER — Ambulatory Visit (HOSPITAL_COMMUNITY)
Admission: RE | Admit: 2023-07-18 | Discharge: 2023-07-18 | Disposition: A | Discharge status: Home / Self Care | Source: Ambulatory Visit | Attending: Family Medicine | Admitting: Family Medicine

## 2023-07-18 ENCOUNTER — Encounter (HOSPITAL_COMMUNITY): Payer: Self-pay

## 2023-07-18 VITALS — BP 105/68 | HR 81 | Ht 60.0 in | Wt 107.2 lb

## 2023-07-18 DIAGNOSIS — Z8679 Personal history of other diseases of the circulatory system: Secondary | ICD-10-CM | POA: Diagnosis not present

## 2023-07-18 DIAGNOSIS — Z720 Tobacco use: Secondary | ICD-10-CM | POA: Diagnosis not present

## 2023-07-18 DIAGNOSIS — Z7902 Long term (current) use of antithrombotics/antiplatelets: Secondary | ICD-10-CM | POA: Insufficient documentation

## 2023-07-18 DIAGNOSIS — I2510 Atherosclerotic heart disease of native coronary artery without angina pectoris: Secondary | ICD-10-CM | POA: Diagnosis not present

## 2023-07-18 DIAGNOSIS — Z79899 Other long term (current) drug therapy: Secondary | ICD-10-CM | POA: Insufficient documentation

## 2023-07-18 DIAGNOSIS — I255 Ischemic cardiomyopathy: Secondary | ICD-10-CM | POA: Diagnosis not present

## 2023-07-18 DIAGNOSIS — I5022 Chronic systolic (congestive) heart failure: Secondary | ICD-10-CM | POA: Diagnosis not present

## 2023-07-18 DIAGNOSIS — I252 Old myocardial infarction: Secondary | ICD-10-CM | POA: Insufficient documentation

## 2023-07-18 DIAGNOSIS — Z91014 Allergy to mammalian meats: Secondary | ICD-10-CM | POA: Insufficient documentation

## 2023-07-18 DIAGNOSIS — C3490 Malignant neoplasm of unspecified part of unspecified bronchus or lung: Secondary | ICD-10-CM | POA: Diagnosis not present

## 2023-07-18 DIAGNOSIS — Z7982 Long term (current) use of aspirin: Secondary | ICD-10-CM | POA: Diagnosis not present

## 2023-07-18 DIAGNOSIS — Z955 Presence of coronary angioplasty implant and graft: Secondary | ICD-10-CM | POA: Diagnosis not present

## 2023-07-18 DIAGNOSIS — Z4502 Encounter for adjustment and management of automatic implantable cardiac defibrillator: Secondary | ICD-10-CM | POA: Diagnosis not present

## 2023-07-18 DIAGNOSIS — Z87891 Personal history of nicotine dependence: Secondary | ICD-10-CM | POA: Insufficient documentation

## 2023-07-18 DIAGNOSIS — I6529 Occlusion and stenosis of unspecified carotid artery: Secondary | ICD-10-CM | POA: Diagnosis not present

## 2023-07-18 DIAGNOSIS — I739 Peripheral vascular disease, unspecified: Secondary | ICD-10-CM | POA: Insufficient documentation

## 2023-07-18 DIAGNOSIS — I671 Cerebral aneurysm, nonruptured: Secondary | ICD-10-CM

## 2023-07-18 LAB — BASIC METABOLIC PANEL
Anion gap: 9 (ref 5–15)
BUN: 16 mg/dL (ref 8–23)
CO2: 22 mmol/L (ref 22–32)
Calcium: 9.4 mg/dL (ref 8.9–10.3)
Chloride: 109 mmol/L (ref 98–111)
Creatinine, Ser: 1.38 mg/dL — ABNORMAL HIGH (ref 0.44–1.00)
GFR, Estimated: 44 mL/min — ABNORMAL LOW (ref >60)
Glucose, Bld: 86 mg/dL (ref 70–99)
Potassium: 4.2 mmol/L (ref 3.5–5.1)
Sodium: 140 mmol/L (ref 135–145)

## 2023-07-18 LAB — BRAIN NATRIURETIC PEPTIDE: B Natriuretic Peptide: 157.2 pg/mL — ABNORMAL HIGH (ref 0.0–100.0)

## 2023-07-18 MED ORDER — SPIRONOLACTONE 25 MG PO TABS: 25.0000 mg | ORAL_TABLET | Freq: Every day | ORAL | 3 refills | Status: DC

## 2023-07-18 NOTE — Patient Instructions (Signed)
 Medication Changes:  TAKE LASIX (FUROSEMIDE) 40MG  TWICE DAILY   INCREASE SPIRONOLACTONE TO 25MG  DAILY AT BEDTIME   Lab Work:  Labs done today, your results will be available in MyChart, we will contact you for abnormal readings.  Testing/Procedures:  ECHOCARDIOGRAM AS SCHEDULED    ELECTROPHYSIOLOGY WILL REACH OUT TO YOU TO GET YOU SCHEDULED FOR FOLLOW UP AFTER YOUR ECHO  Follow-Up in: 2 MONTHS PLEASE CALL OUR OFFICE AROUND APRIL TO GET SCHEDULED FOR YOUR APPOINTMENT. PHONE NUMBER IS [phone] OPTION 2   At the [facility], you and your health needs are our priority. We have a designated team specialized in the treatment of Heart Failure. This Care Team includes your primary Heart Failure Specialized Cardiologist (physician), Advanced Practice Providers (APPs- Physician Assistants and Nurse Practitioners), and Pharmacist who all work together to provide you with the care you need, when you need it.   You may see any of the following providers on your designated Care Team at your next follow up:  Dr. Arvilla Meres Dr. Marca Ancona Dr. Dorthula Nettles Dr. Theresia Bough Tonye Becket, NP Robbie Lis, [place] [facility] [place], [place] Brynda Peon, NP [place] Lee, NP Karle Plumber, PharmD   Please be sure to bring in all your medications bottles to every appointment.   Need to Contact us:  If you have any questions or concerns before your next appointment please send [place] a message through [place] or call our office at [phone].    TO LEAVE A MESSAGE FOR THE NURSE SELECT OPTION 2, PLEASE LEAVE A MESSAGE INCLUDING: YOUR NAME DATE OF BIRTH CALL BACK NUMBER REASON FOR CALL**this is important as we prioritize the call backs  YOU WILL RECEIVE A CALL BACK THE SAME DAY AS LONG AS YOU CALL BEFORE 4:00 PM

## 2023-07-19 NOTE — Telephone Encounter (Signed)
Patient seen in office on 3/11

## 2023-07-20 ENCOUNTER — Encounter (HOSPITAL_COMMUNITY)
Admission: RE | Admit: 2023-07-20 | Discharge: 2023-07-20 | Disposition: A | Discharge status: Home / Self Care | Source: Ambulatory Visit | Attending: Acute Care | Admitting: Acute Care

## 2023-07-20 ENCOUNTER — Encounter (HOSPITAL_COMMUNITY): Payer: Self-pay

## 2023-07-20 ENCOUNTER — Ambulatory Visit (INDEPENDENT_AMBULATORY_CARE_PROVIDER_SITE_OTHER): Payer: Medicare PPO | Admitting: Internal Medicine

## 2023-07-20 ENCOUNTER — Encounter: Payer: Self-pay | Admitting: Internal Medicine

## 2023-07-20 ENCOUNTER — Ambulatory Visit (HOSPITAL_COMMUNITY)
Admission: RE | Admit: 2023-07-20 | Discharge: 2023-07-20 | Disposition: A | Discharge status: Home / Self Care | Source: Ambulatory Visit | Attending: Acute Care | Admitting: Acute Care

## 2023-07-20 ENCOUNTER — Telehealth: Payer: Self-pay | Admitting: Emergency Medicine

## 2023-07-20 VITALS — BP 108/72 | HR 113 | Ht 60.0 in | Wt 104.2 lb

## 2023-07-20 DIAGNOSIS — I5042 Chronic combined systolic (congestive) and diastolic (congestive) heart failure: Secondary | ICD-10-CM

## 2023-07-20 DIAGNOSIS — F410 Panic disorder [episodic paroxysmal anxiety] without agoraphobia: Secondary | ICD-10-CM

## 2023-07-20 DIAGNOSIS — C3412 Malignant neoplasm of upper lobe, left bronchus or lung: Secondary | ICD-10-CM | POA: Diagnosis not present

## 2023-07-20 DIAGNOSIS — R110 Nausea: Secondary | ICD-10-CM | POA: Diagnosis not present

## 2023-07-20 DIAGNOSIS — C3490 Malignant neoplasm of unspecified part of unspecified bronchus or lung: Secondary | ICD-10-CM | POA: Diagnosis present

## 2023-07-20 MED ORDER — FLUDEOXYGLUCOSE F - 18 (FDG) INJECTION
5.0300 | Freq: Once | INTRAVENOUS | Status: AC | PRN
  Administered 2023-07-20: 5.03 via INTRAVENOUS

## 2023-07-20 NOTE — Patient Instructions (Signed)
 It was a pleasure to see you today.  Thank you for giving [place] the opportunity to be involved in your care.  Below is a brief recap of your visit and next steps.  We will plan to see you again in 3 months.  Summary No medication changes today We will tentatively plan for follow up in 3 months Please let us now if anything is needed

## 2023-07-20 NOTE — Progress Notes (Signed)
 Established Patient Office Visit  Subjective   Patient ID: Erin  Adora [name], female    DOB: [birth year]  Age: 63 y.o. MRN: [id number]  Chief Complaint  Patient presents with   Care Management    Six week follow up    Erin [name] returns to care today for follow-up.  She was last evaluated by me on 1/23 for follow-up of anxiety with panic attacks.  She endorsed poorly controlled symptoms and insomnia at that time.  Sertraline was increased to 50 mg daily and trazodone 50 mg nightly was added in the setting of insomnia.  6-week follow-up was arranged for reassessment.  She was also referred to dermatology.  In the interim, she underwent bronchoscopy with biopsy in the setting of a left upper lobe groundglass nodule.  Unfortunately, pathology resulted positive for adenocarcinoma.  She has been referred to oncology.  MRI and PET scan are pending.  She has also been seen by gastroenterology, orthopedic surgery, cardiology, and established care with dermatology.  Today she reports feeling "okay".  She acknowledges the recent diagnosis of lung cancer and is aware of the next steps in treatment.  She does not have any acute concerns to discuss today.  Past Medical History:  Diagnosis Date   AICD (automatic cardioverter/defibrillator) present    Allergic reaction to alpha-gal    Allergy 03/05/2022   Anemia    Anxiety state 03/25/2016   Arthritis    Basal cell carcinoma of forehead    Brain aneurysm    CHF (congestive heart failure) (HCC)    Coronary artery disease    a. 03/11/16 PCI with DES-->Prox/Mid Cx;  b. 03/14/16 PCI with DES x2-->RCA, EF 30-35%.   Depression 06/09/2010   Encounter for general adult medical examination with abnormal findings 05/04/2022   Essential hypertension    Hx   GERD (gastroesophageal reflux disease)    HFrEF (heart failure with reduced ejection fraction) (HCC)    a. 10/2016 Echo: EF 35-40%, Gr1 DD, mild focal basal septal hypertrophy, basal inflat, mid inflat, basal  antlat AK. Mid infept/inf/antlat, apical lateral sev HK. Mod MR. mildly reduced RV fxn. Mild TR.   History of pneumonia    Hyperlipidemia    IBS (irritable bowel syndrome)    Ischemic cardiomyopathy    a. 10/2016 Echo: EF 35-40%, Gr1 DD.   Lung cancer [facility])    Mitral regurgitation    Neuromuscular disorder (HCC)    NSTEMI (non-ST elevated myocardial infarction) (HCC) 03/10/2016   Pneumonia 03/2016   Shingles    Squamous cell cancer of skin of nose    Thrombocytosis 03/26/2016   Tobacco abuse    Trichimoniasis    Wears dentures    Wears glasses    Past Surgical History:  Procedure Laterality Date   APPENDECTOMY     BICEPT TENODESIS Left 03/21/2023   Procedure: BICEPS TENODESIS;  Surgeon: Jasmine Mesi, MD;  Location: [facility] OR;  Service: Orthopedics;  Laterality: Left;   BIOPSY  09/20/2018   Procedure: BIOPSY;  Surgeon: Suzette Espy, MD;  [facility];  Service: Endoscopy;;  colon   BIOPSY  01/05/2021   Procedure: BIOPSY;  Surgeon: Janel Medford, MD;  Location: [facility] ENDOSCOPY;  Service: Endoscopy;;   BIOPSY  04/27/2022   Procedure: BIOPSY;  Surgeon: Vinetta Greening, DO;  [facility]: [facility];  Service: Endoscopy;;   BRONCHIAL BIOPSY  07/03/2023   Procedure: BRONCHIAL BIOPSIES;  Surgeon: Denson Flake, MD;  Location: [facility] ENDOSCOPY;  Service: Pulmonary;;  BRONCHIAL BRUSHINGS  07/03/2023   Procedure: BRONCHIAL BRUSHINGS;  Surgeon: Denson Flake, MD;  Location: [facility] ENDOSCOPY;  Service: Pulmonary;;   BRONCHIAL NEEDLE ASPIRATION BIOPSY  07/03/2023   Procedure: BRONCHIAL NEEDLE ASPIRATION BIOPSIES;  Surgeon: Denson Flake, MD;  Location: [facility] ENDOSCOPY;  Service: Pulmonary;;   BRONCHIAL WASHINGS  07/03/2023   Procedure: BRONCHIAL WASHINGS;  Surgeon: Denson Flake, MD;  Location: [facility] ENDOSCOPY;  Service: Pulmonary;;   CARDIAC CATHETERIZATION N/A 03/11/2016   Procedure: Left Heart Cath and Coronary Angiography;  Surgeon: Arleen Lacer, MD;  Location: [facility]  INVASIVE CV LAB;  Service: Cardiovascular;  Laterality: N/A;   CARDIAC CATHETERIZATION N/A 03/11/2016   Procedure: Coronary Stent Intervention;  Surgeon: Arleen Lacer, MD;  Location: [facility] INVASIVE CV LAB;  Service: Cardiovascular;  Laterality: N/A;   CARDIAC CATHETERIZATION N/A 03/14/2016   Procedure: Coronary Stent Intervention;  Surgeon: Peter M Swaziland, MD;  Location: [facility] INVASIVE CV LAB;  Service: Cardiovascular;  Laterality: N/A;   CEREBRAL ANEURYSM REPAIR  04/2019   stent placed   CHOLECYSTECTOMY OPEN  1984   COLONOSCOPY WITH PROPOFOL N/A 09/20/2018   Procedure: COLONOSCOPY WITH PROPOFOL;  Surgeon: Suzette Espy, MD;  [facility];  Service: Endoscopy;  Laterality: N/A;  10:30am   CORONARY ANGIOPLASTY WITH STENT PLACEMENT  03/14/2016   ESOPHAGOGASTRODUODENOSCOPY (EGD) WITH PROPOFOL N/A 09/20/2018   Procedure: ESOPHAGOGASTRODUODENOSCOPY (EGD) WITH PROPOFOL;  Surgeon: Suzette Espy, MD;  [facility];  Service: Endoscopy;  Laterality: N/A;   ESOPHAGOGASTRODUODENOSCOPY (EGD) WITH PROPOFOL N/A 01/05/2021   Procedure: ESOPHAGOGASTRODUODENOSCOPY (EGD) WITH PROPOFOL;  Surgeon: Janel Medford, MD;  Location: [facility] ENDOSCOPY;  Service: Endoscopy;  Laterality: N/A;   ESOPHAGOGASTRODUODENOSCOPY (EGD) WITH PROPOFOL N/A 04/27/2022   Procedure: ESOPHAGOGASTRODUODENOSCOPY (EGD) WITH PROPOFOL;  Surgeon: Vinetta Greening, DO;  [facility];  Service: Endoscopy;  Laterality: N/A;  9:15am, asa 3/4, ASAP   ESOPHAGOGASTRODUODENOSCOPY (EGD) WITH PROPOFOL N/A 01/04/2023   Procedure: ESOPHAGOGASTRODUODENOSCOPY (EGD) WITH PROPOFOL;  Surgeon: Suzette Espy, MD;  [facility];  Service: Endoscopy;  Laterality: N/A;  130pm, asa 3   FIDUCIAL MARKER PLACEMENT  07/03/2023   Procedure: FIDUCIAL MARKER PLACEMENT;  Surgeon: Denson Flake, MD;  Location: [facility] ENDOSCOPY;  Service: Pulmonary;;   FINGER ARTHROPLASTY Left 05/14/2013   Procedure: LEFT THUMB CARPAL METACARPAL  ARTHROPLASTY;  Surgeon: Milagros Alf, MD;  Location: [facility];  Service: Orthopedics;  Laterality: Left;   ICD IMPLANT N/A 04/03/2020   Procedure: ICD IMPLANT;  Surgeon: Lei Pump, MD;  Location: [facility] INVASIVE CV LAB;  Service: Cardiovascular;  Laterality: N/A;   IR ANGIO INTRA EXTRACRAN SEL COM CAROTID INNOMINATE BILAT MOD SED  01/05/2017   IR ANGIO INTRA EXTRACRAN SEL COM CAROTID INNOMINATE BILAT MOD SED  03/19/2019   IR ANGIO INTRA EXTRACRAN SEL COM CAROTID INNOMINATE BILAT MOD SED  06/04/2020   IR ANGIO INTRA EXTRACRAN SEL INTERNAL CAROTID UNI L MOD SED  04/15/2019   IR ANGIO VERTEBRAL SEL VERTEBRAL BILAT MOD SED  01/05/2017   IR ANGIO VERTEBRAL SEL VERTEBRAL BILAT MOD SED  03/19/2019   IR ANGIO VERTEBRAL SEL VERTEBRAL UNI L MOD SED  06/04/2020   IR ANGIOGRAM FOLLOW UP STUDY  04/15/2019   IR RADIOLOGIST EVAL & MGMT  12/30/2016   IR TRANSCATH/EMBOLIZ  04/15/2019   IR US  GUIDE VASC ACCESS RIGHT  03/19/2019   IR US  GUIDE VASC ACCESS RIGHT  06/04/2020   JOINT REPLACEMENT     MALONEY  DILATION N/A 09/20/2018   Procedure: Londa Rival DILATION;  Surgeon: Suzette Espy, MD;  [facility];  Service: Endoscopy;  Laterality: N/A;   MALONEY DILATION N/A 01/04/2023   Procedure: Londa Rival DILATION;  Surgeon: Suzette Espy, MD;  [facility]: [facility];  Service: Endoscopy;  Laterality: N/A;   RADIOLOGY WITH ANESTHESIA N/A 04/15/2019   Procedure: Rich Champ;  Surgeon: Luellen Sages, MD;  Location: [facility] OR;  Service: Radiology;  Laterality: N/A;   REVERSE SHOULDER ARTHROPLASTY Left 03/21/2023   Procedure: LEFT REVERSE SHOULDER ARTHROPLASTY;  Surgeon: Jasmine Mesi, MD;  Location: [facility] OR;  Service: Orthopedics;  Laterality: Left;   RIGHT/LEFT HEART CATH AND CORONARY ANGIOGRAPHY N/A 08/19/2019   Procedure: RIGHT/LEFT HEART CATH AND CORONARY ANGIOGRAPHY;  Surgeon: Darlis Eisenmenger, MD;  Location: [facility] INVASIVE CV LAB;  Service: Cardiovascular;  Laterality: N/A;    TUBAL LIGATION  1987   VAGINAL HYSTERECTOMY  2009   Social History   Tobacco Use   Smoking status: Former    Current packs/day: 1.50    Average packs/day: 1.5 packs/day for 40.0 years (60.0 ttl pk-yrs)    Types: Cigarettes   Smokeless tobacco: Never   Tobacco comments:    Smokes off and on 06/14/23 Cala Castleman, CMA     No cigarettes since Thursday this week>> 07/06/2023  Vaping Use   Vaping status: Never Used  Substance Use Topics   Alcohol use: Not Currently    Comment: occasionally   Drug use: Not Currently    Types: Marijuana   Family History  Problem Relation Age of Onset   Stroke Mother    Hypertension Mother    Diabetes Mother    Heart attack Mother    Heart attack Father    Diabetes Father    Hypertension Father    CAD Father    Colon polyps Father 69       pre-cancerous    Stroke Father    Dementia Father    Hyperlipidemia Father    Arthritis Father    COPD Father        smoked and worked [employer]   Heart disease Father    Breast cancer Maternal Grandmother    Diabetes Maternal Grandmother    Cancer Maternal Grandfather        Tongue and esophageal   Cancer Paternal Grandmother    Anxiety disorder Daughter    Depression Daughter    Anxiety disorder Daughter    Heart failure Other    Colon cancer Neg Hx    Allergies  Allergen Reactions   Alpha-Gal Shortness Of Breath, Nausea And Vomiting and Dermatitis   Other Shortness Of Breath, Diarrhea, Nausea And Vomiting and Nausea Only    All- red meats/dairy  Medications in Capsule form    Chantix [Varenicline] Nausea Only   Reglan [Metoclopramide]     Involuntary movements   Tape Other (See Comments)    PEELS SKIN OFF  (PAPER TAPE IS FINE)   Review of Systems  Constitutional:  Negative for chills and fever.  HENT:  Negative for sore throat.   Respiratory:  Negative for cough and shortness of breath.   Cardiovascular:  Negative for chest pain, palpitations and leg swelling.  Gastrointestinal:   Negative for abdominal pain, blood in stool, constipation, diarrhea, nausea and vomiting.  Genitourinary:  Negative for dysuria and hematuria.  Musculoskeletal:  Negative for myalgias.  Skin:  Negative for itching and rash.  Neurological:  Negative for dizziness and headaches.  Psychiatric/Behavioral:  Negative for depression and suicidal ideas.      Objective:     BP 108/72 (BP Location: Left Arm, Patient Position: Sitting, Cuff Size: Normal)   Pulse (!) 113   Ht 5' (1.524 m)   Wt 104 lb 3.2 oz (47.3 kg)   SpO2 96%   BMI 20.35 kg/m  BP Readings from Last 3 Encounters:  08/15/23 114/80  08/15/23 106/68  08/11/23 (!) 97/56   Physical Exam Vitals reviewed.  Constitutional:      General: She is not in acute distress.    Appearance: Normal appearance. She is not ill-appearing or toxic-appearing.  HENT:     Head: Normocephalic and atraumatic.     Right Ear: External ear normal.     Left Ear: External ear normal.     Nose: Nose normal. No congestion or rhinorrhea.     Mouth/Throat:     Mouth: Mucous membranes are moist.     Pharynx: Oropharynx is clear. No oropharyngeal exudate or posterior oropharyngeal erythema.  Eyes:     General: No scleral icterus.    Extraocular Movements: Extraocular movements intact.     Conjunctiva/sclera: Conjunctivae normal.     Pupils: Pupils are equal, round, and reactive to light.  Cardiovascular:     Rate and Rhythm: Normal rate and regular rhythm.     Pulses: Normal pulses.     Heart sounds: Normal heart sounds. No murmur heard.    No friction rub. No gallop.  Pulmonary:     Effort: Pulmonary effort is normal.     Breath sounds: Normal breath sounds. No wheezing, rhonchi or rales.  Abdominal:     General: Abdomen is flat. Bowel sounds are normal. There is no distension.     Palpations: Abdomen is soft.     Tenderness: There is no abdominal tenderness.  Musculoskeletal:        General: No swelling. Normal range of motion.     Cervical  back: Normal range of motion.     Right lower leg: No edema.     Left lower leg: No edema.  Lymphadenopathy:     Cervical: No cervical adenopathy.  Skin:    General: Skin is warm and dry.     Capillary Refill: Capillary refill takes less than 2 seconds.     Coloration: Skin is not jaundiced.  Neurological:     General: No focal deficit present.     Mental Status: She is alert and oriented to person, place, and time.  Psychiatric:        Mood and Affect: Mood normal.        Behavior: Behavior normal.   Last CBC Lab Results  Component Value Date   WBC 6.9 08/15/2023   HGB 10.7 (L) 08/15/2023   HCT 33.3 (L) 08/15/2023   MCV 82.4 08/15/2023   MCH 26.5 08/15/2023   RDW 17.1 (H) 08/15/2023   PLT 425 (H) 08/15/2023   Last metabolic panel Lab Results  Component Value Date   GLUCOSE 86 08/15/2023   NA 136 08/15/2023   K 4.6 08/15/2023   CL 103 08/15/2023   CO2 24 08/15/2023   BUN 25 (H) 08/15/2023   CREATININE 1.58 (H) 08/15/2023   GFRNONAA 37 (L) 08/15/2023   CALCIUM 9.8 08/15/2023   PROT 6.4 (L) 08/15/2023   ALBUMIN 3.7 08/15/2023   LABGLOB 2.6 06/30/2022   AGRATIO 1.7 06/30/2022   BILITOT 0.7 08/15/2023   ALKPHOS 30 (L) 08/15/2023   AST 22 08/15/2023  ALT 13 08/15/2023   ANIONGAP 9 08/15/2023   Last lipids Lab Results  Component Value Date   CHOL 145 08/15/2023   HDL 44 08/15/2023   LDLCALC 73 08/15/2023   TRIG 140 08/15/2023   CHOLHDL 3.3 08/15/2023   Last hemoglobin A1c Lab Results  Component Value Date   HGBA1C 6.2 (H) 04/26/2022   Last thyroid functions Lab Results  Component Value Date   TSH 0.702 01/24/2023   Last vitamin D Lab Results  Component Value Date   VD25OH 20.2 (L) 04/26/2022   Last vitamin B12 and Folate Lab Results  Component Value Date   VITAMINB12 373 04/26/2022   FOLATE 13.4 04/26/2022     Assessment & Plan:   Problem List Items Addressed This Visit       Chronic combined systolic and diastolic congestive heart  failure (HCC) - Primary   Followed by the advanced heart failure team (Dr. Mitzie Anda).  S/p ICD placement.  Recently seen for follow-up.  Diuretic regimen adjusted.  She appears euvolemic on exam today.  Repeat TTE pending.      Primary adenocarcinoma of upper lobe of left lung (HCC)   Recently seen by pulmonology for follow-up in the setting of left upper lobe groundglass nodule.  Underwent bronchoscopy with biopsy 2/24 with pathology revealing adenocarcinoma.  She has been referred to oncology.  PET scan and MRI brain are pending.  Patient is aware of these results and understands the next steps in treatment.      Nausea without vomiting   Chronic issue.  Recently seen by GI for follow-up in the setting of chronic nausea with unintentional weight loss.  Her weight today is 104 lbs, down from 109 lbs on 1/23.  She is currently prescribed pantoprazole and Zofran for symptom relief.  Reglan recently discontinued due to adverse side effects.  CTA of the abdomen to rule out mesenteric ischemia is pending.      Panic attacks   Symptoms are stable currently.  Sertraline was recently increased to 50 mg daily and trazodone 50 mg added nightly as needed in the setting of insomnia.  She is not interested in making any additional changes today and will see psychiatry on 4/28.      Return in about 3 months (around 10/20/2023).   [name], MD

## 2023-07-20 NOTE — Telephone Encounter (Signed)
 PT states Erin [name] can onto do her CT because she has a defibrillator. It can be done at [facility].   Cell is [phone]

## 2023-07-21 NOTE — Telephone Encounter (Signed)
 Spoke with patient NFN

## 2023-07-24 ENCOUNTER — Encounter

## 2023-07-24 NOTE — Progress Notes (Signed)
 Location of tumor and Histology per Pathology Report: Left upper lobe  Biopsy:    Past/Anticipated interventions by surgeon, if any: left    Past/Anticipated interventions by medical oncology, if any: Patient to see Dr. Shirline Frees on 07/31/23    Pain issues, if any:  {:18581} {PAIN DESCRIPTION:21022940}  SAFETY ISSUES: Prior radiation? {:18581} Pacemaker/ICD? {:18581} Possible current pregnancy? no Is the patient on methotrexate? {:18581}  Current Complaints / other details:  ***     ***

## 2023-07-25 NOTE — Progress Notes (Signed)
 Radiation Oncology         [phone] ________________________________  Initial out patient Consultation  Name: Erin [name] MRN: [id number]  Date: 07/26/2023  DOB: [birth year]  DG:LOVFI, Lucina Mellow, MD  Leslye Peer, MD   REFERRING PHYSICIAN: Leslye Peer, MD  DIAGNOSIS: There were no encounter diagnoses.  New diagnosis adenocarcinoma of the left upper lobe (HCC)  HISTORY OF PRESENT ILLNESS::Erin [name] is a 63 y.o. female who is accompanied by ***. she is seen as a courtesy of Dr. Levy Pupa for an opinion concerning radiation therapy as part of management for her recently diagnosed lung cancer. She is an active smoker with a history of 60 pack years but has reduced smoking to about two cigarettes a week with Chantix. Patient also has a history of basal cell carcinoma and a history of multiple pulmonary nodules determined by computed tomography of lung.    The patient presented to the ED on 04-15-23 with complains of SOB and dyspnea. Pertinent imaging done at that time included a EEG showing borderline low voltage without ST or T changes. Chest x-ray shows no active cardiopulmonary disease. CT angiogram showed no evidence of pulmonary embolism. The patient was discharged with the impression that the episode might have been related to a panic attack. She then presented for a follow up with her PCP who had recommended undergoing a repeat CT chest to further investigate her symptoms. As such, she underwent a CT chest on 04-27-24 showing a nodule measuring 1.3 by 1.4 centimeters with an area of lucency inferiorly, suggesting cystic change versus bronchiectasis. Follow-up imaging was recommended at six months. The left upper lobe ground glass nodule was first identified on May 10, 2019, and remained stable on serial imaging, including in October 2024.          Subsequently, she was referred to Dr. Delton Coombes on 06-14-23 who preformed a robotic assisted navigational bronchoscopy on  07-04-23. Surgical biopsy of upper lobe of left lung indicated malignant adenocarcinoma.       She presented for a post-op follow up with NP S. Groce on 07-11-23. At that time, she reported recovering from procedure. Upon discussion, she decided to proceed with radiation treatment. Staging awaited PET scan and brain MRI. For staging purposes, she underwent a PET scan on 07-20-23 showing (result not final). ***   Of note:  --patient was seen in the ED on 06-18-23 with complains of nausea secondary to cannabinoid hyperemesis syndrome  -- presented to the ED on 2-28 and 3-1 with complains of dizziness.  No clinical evidence of acute neurologic events. X-ray of the chest showed no acute abnormalities. EKG and troponin were unremarkable. She was referred to GI to further evaluate her symptoms.  --Pulmonary function testing: Moderately severe obstruction without bronchodilator response. FEV1 77% predicted. Normal lung volumes. Decreased diffusion capacity not fully corrected when adjusted for alveolar volume (06/08/2016)   PREVIOUS RADIATION THERAPY: {EXAM; YES/NO:19492::"No"}  PAST MEDICAL HISTORY:  Past Medical History:  Diagnosis Date   AICD (automatic cardioverter/defibrillator) present    Allergic reaction to alpha-gal    Allergy 03/05/2022   Anemia    Anxiety state 03/25/2016   Arthritis    Basal cell carcinoma of forehead    Brain aneurysm    CHF (congestive heart failure) (HCC)    Coronary artery disease    a. 03/11/16 PCI with DES-->Prox/Mid Cx;  b. 03/14/16 PCI with DES x2-->RCA, EF 30-35%.   Depression 06/09/2010   Encounter for general adult  medical examination with abnormal findings 05/04/2022   Essential hypertension    Hx   GERD (gastroesophageal reflux disease)    HFrEF (heart failure with reduced ejection fraction) (HCC)    a. 10/2016 Echo: EF 35-40%, Gr1 DD, mild focal basal septal hypertrophy, basal inflat, mid inflat, basal antlat AK. Mid infept/inf/antlat, apical lateral sev HK.  Mod MR. mildly reduced RV fxn. Mild TR.   History of pneumonia    Hyperlipidemia    IBS (irritable bowel syndrome)    Ischemic cardiomyopathy    a. 10/2016 Echo: EF 35-40%, Gr1 DD.   Lung cancer [facility])    Mitral regurgitation    Neuromuscular disorder (HCC)    NSTEMI (non-ST elevated myocardial infarction) (HCC) 03/10/2016   Pneumonia 03/2016   Shingles    Squamous cell cancer of skin of nose    Thrombocytosis 03/26/2016   Tobacco abuse    Trichimoniasis    Wears dentures    Wears glasses     PAST SURGICAL HISTORY: Past Surgical History:  Procedure Laterality Date   APPENDECTOMY     BICEPT TENODESIS Left 03/21/2023   Procedure: BICEPS TENODESIS;  Surgeon: Cammy Copa, MD;  Location: [facility] OR;  Service: Orthopedics;  Laterality: Left;   BIOPSY  09/20/2018   Procedure: BIOPSY;  Surgeon: Corbin Ade, MD;  [facility];  Service: Endoscopy;;  colon   BIOPSY  01/05/2021   Procedure: BIOPSY;  Surgeon: Rachael Fee, MD;  Location: [facility] ENDOSCOPY;  Service: Endoscopy;;   BIOPSY  04/27/2022   Procedure: BIOPSY;  Surgeon: Lanelle Bal, DO;  Location: [facility];  Service: Endoscopy;;   BRONCHIAL BIOPSY  07/03/2023   Procedure: BRONCHIAL BIOPSIES;  Surgeon: Leslye Peer, MD;  Location: [facility] ENDOSCOPY;  Service: Pulmonary;;   BRONCHIAL BRUSHINGS  07/03/2023   Procedure: BRONCHIAL BRUSHINGS;  Surgeon: Leslye Peer, MD;  Location[facility] ENDOSCOPY;  Service: Pulmonary;;   BRONCHIAL NEEDLE ASPIRATION BIOPSY  07/03/2023   Procedure: BRONCHIAL NEEDLE ASPIRATION BIOPSIES;  Surgeon: Leslye Peer, MD;  Location: [facility] ENDOSCOPY;  Service: Pulmonary;;   BRONCHIAL WASHINGS  07/03/2023   Procedure: BRONCHIAL WASHINGS;  Surgeon: Leslye Peer, MD;  Location: [facility] ENDOSCOPY;  Service: Pulmonary;;   CARDIAC CATHETERIZATION N/A 03/11/2016   Procedure: Left Heart Cath and Coronary Angiography;  Surgeon: Marykay Lex, MD;  Location: [facility] INVASIVE CV LAB;  Service:  Cardiovascular;  Laterality: N/A;   CARDIAC CATHETERIZATION N/A 03/11/2016   Procedure: Coronary Stent Intervention;  Surgeon: Marykay Lex, MD;  Location: [facility] INVASIVE CV LAB;  Service: Cardiovascular;  Laterality: N/A;   CARDIAC CATHETERIZATION N/A 03/14/2016   Procedure: Coronary Stent Intervention;  Surgeon: Peter M Swaziland, MD;  Location: [facility] INVASIVE CV LAB;  Service: Cardiovascular;  Laterality: N/A;   CEREBRAL ANEURYSM REPAIR  04/2019   stent placed   CHOLECYSTECTOMY OPEN  1984   COLONOSCOPY WITH PROPOFOL N/A 09/20/2018   Procedure: COLONOSCOPY WITH PROPOFOL;  Surgeon: Corbin Ade, MD;  [facility];  Service: Endoscopy;  Laterality: N/A;  10:30am   CORONARY ANGIOPLASTY WITH STENT PLACEMENT  03/14/2016   ESOPHAGOGASTRODUODENOSCOPY (EGD) WITH PROPOFOL N/A 09/20/2018   Procedure: ESOPHAGOGASTRODUODENOSCOPY (EGD) WITH PROPOFOL;  Surgeon: Corbin Ade, MD;  [facility];  Service: Endoscopy;  Laterality: N/A;   ESOPHAGOGASTRODUODENOSCOPY (EGD) WITH PROPOFOL N/A 01/05/2021   Procedure: ESOPHAGOGASTRODUODENOSCOPY (EGD) WITH PROPOFOL;  Surgeon: Rachael Fee, MD;  Location: [facility] ENDOSCOPY;  Service: Endoscopy;  Laterality: N/A;   ESOPHAGOGASTRODUODENOSCOPY (EGD)  WITH PROPOFOL N/A 04/27/2022   Procedure: ESOPHAGOGASTRODUODENOSCOPY (EGD) WITH PROPOFOL;  Surgeon: Lanelle Bal, DO;  [facility];  Service: Endoscopy;  Laterality: N/A;  9:15am, asa 3/4, ASAP   ESOPHAGOGASTRODUODENOSCOPY (EGD) WITH PROPOFOL N/A 01/04/2023   Procedure: ESOPHAGOGASTRODUODENOSCOPY (EGD) WITH PROPOFOL;  Surgeon: Corbin Ade, MD;  [facility];  Service: Endoscopy;  Laterality: N/A;  130pm, asa 3   FIDUCIAL MARKER PLACEMENT  07/03/2023   Procedure: FIDUCIAL MARKER PLACEMENT;  Surgeon: Leslye Peer, MD;  Location: [facility] ENDOSCOPY;  Service: Pulmonary;;   FINGER ARTHROPLASTY Left 05/14/2013   Procedure: LEFT THUMB CARPAL METACARPAL ARTHROPLASTY;  Surgeon: Tami Ribas, MD;  Location: [facility];  Service: Orthopedics;  Laterality: Left;   ICD IMPLANT N/A 04/03/2020   Procedure: ICD IMPLANT;  Surgeon: Regan Lemming, MD;  Location: [facility] INVASIVE CV LAB;  Service: Cardiovascular;  Laterality: N/A;   IR ANGIO INTRA EXTRACRAN SEL COM CAROTID INNOMINATE BILAT MOD SED  01/05/2017   IR ANGIO INTRA EXTRACRAN SEL COM CAROTID INNOMINATE BILAT MOD SED  03/19/2019   IR ANGIO INTRA EXTRACRAN SEL COM CAROTID INNOMINATE BILAT MOD SED  06/04/2020   IR ANGIO INTRA EXTRACRAN SEL INTERNAL CAROTID UNI L MOD SED  04/15/2019   IR ANGIO VERTEBRAL SEL VERTEBRAL BILAT MOD SED  01/05/2017   IR ANGIO VERTEBRAL SEL VERTEBRAL BILAT MOD SED  03/19/2019   IR ANGIO VERTEBRAL SEL VERTEBRAL UNI L MOD SED  06/04/2020   IR ANGIOGRAM FOLLOW UP STUDY  04/15/2019   IR RADIOLOGIST EVAL & MGMT  12/30/2016   IR TRANSCATH/EMBOLIZ  04/15/2019   IR US GUIDE VASC ACCESS RIGHT  03/19/2019   IR US GUIDE VASC ACCESS RIGHT  06/04/2020   JOINT REPLACEMENT     MALONEY DILATION N/A 09/20/2018   Procedure: Elease Hashimoto DILATION;  Surgeon: Corbin Ade, MD;  [facility];  Service: Endoscopy;  Laterality: N/A;   MALONEY DILATION N/A 01/04/2023   Procedure: Elease Hashimoto DILATION;  Surgeon: Corbin Ade, MD;  Location: [facility];  Service: Endoscopy;  Laterality: N/A;   RADIOLOGY WITH ANESTHESIA N/A 04/15/2019   Procedure: Sharman Crate;  Surgeon: Julieanne Cotton, MD;  Location: [facility] OR;  Service: Radiology;  Laterality: N/A;   REVERSE SHOULDER ARTHROPLASTY Left 03/21/2023   Procedure: LEFT REVERSE SHOULDER ARTHROPLASTY;  Surgeon: Cammy Copa, MD;  Location: [facility] OR;  Service: Orthopedics;  Laterality: Left;   RIGHT/LEFT HEART CATH AND CORONARY ANGIOGRAPHY N/A 08/19/2019   Procedure: RIGHT/LEFT HEART CATH AND CORONARY ANGIOGRAPHY;  Surgeon: Laurey Morale, MD;  Location: [facility] INVASIVE CV LAB;  Service: Cardiovascular;  Laterality: N/A;   TUBAL LIGATION  1987   VAGINAL  HYSTERECTOMY  2009    FAMILY HISTORY:  Family History  Problem Relation Age of Onset   Stroke Mother    Hypertension Mother    Diabetes Mother    Heart attack Mother    Heart attack Father    Diabetes Father    Hypertension Father    CAD Father    Colon polyps Father 75       pre-cancerous    Stroke Father    Dementia Father    Hyperlipidemia Father    Arthritis Father    COPD Father        smoked and worked [employer]   Heart disease Father    Breast cancer Maternal Grandmother    Diabetes Maternal Grandmother    Cancer Maternal Grandfather  Tongue and esophageal   Cancer Paternal Grandmother    Anxiety disorder Daughter    Depression Daughter    Anxiety disorder Daughter    Heart failure Other    Colon cancer Neg Hx     SOCIAL HISTORY:  Social History   Tobacco Use   Smoking status: Former    Current packs/day: 1.50    Average packs/day: 1.5 packs/day for 40.0 years (60.0 ttl pk-yrs)    Types: Cigarettes   Smokeless tobacco: Never   Tobacco comments:    Smokes off and on 06/14/23 [name], CMA     No cigarettes since Thursday this week>> 07/06/2023  Vaping Use   Vaping status: Never Used  Substance Use Topics   Alcohol use: Not Currently    Comment: occasionally   Drug use: Not Currently    Types: Marijuana    ALLERGIES:  Allergies  Allergen Reactions   Alpha-Gal Shortness Of Breath, Nausea And Vomiting and Dermatitis   Other Shortness Of Breath, Diarrhea, Nausea And Vomiting and Nausea Only    All- red meats/dairy  Medications in Capsule form    Tape Other (See Comments)    PEELS SKIN OFF  (PAPER TAPE IS FINE)    MEDICATIONS:  Current Outpatient Medications  Medication Sig Dispense Refill   aspirin EC 81 MG tablet Take 1 tablet (81 mg total) by mouth every evening. Okay to restart on 2/29/2025     atorvastatin (LIPITOR) 80 MG tablet Take 1 tablet (80 mg total) by mouth daily. 90 tablet 3   clopidogrel (PLAVIX) 75 MG tablet Take  1 tablet (75 mg total) by mouth daily. Okay to restart on 07/04/2023     dapagliflozin propanediol (FARXIGA) 10 MG TABS tablet Take 1 tablet (10 mg total) by mouth daily before breakfast. 30 tablet 11   EPINEPHrine (EPIPEN 2-PAK) 0.3 mg/0.3 mL IJ SOAJ injection Inject 0.3 mg into the muscle as needed for anaphylaxis. 2 each 2   Evolocumab (REPATHA SURECLICK) 140 MG/ML SOAJ Inject 140 mg into the skin every 14 (fourteen) days. 6 mL 3   fenofibrate (TRICOR) 145 MG tablet Take 1 tablet (145 mg total) by mouth daily. 90 tablet 3   furosemide (LASIX) 20 MG tablet Take 2 tablets (40 mg total) by mouth 2 (two) times daily. 360 tablet 3   gabapentin (NEURONTIN) 300 MG capsule Take 1 capsule by mouth at bedtime 30 capsule 0   hydrOXYzine (VISTARIL) 25 MG capsule Take 1 capsule (25 mg total) by mouth every 8 (eight) hours as needed. 30 capsule 1   linaclotide (LINZESS) 145 MCG CAPS capsule Take 1 capsule (145 mcg total) by mouth daily before breakfast. 30 capsule 11   loperamide (IMODIUM A-D) 2 MG tablet Take 2 mg by mouth 4 (four) times daily as needed for diarrhea or loose stools.     losartan (COZAAR) 25 MG tablet Take 0.5 tablets (12.5 mg total) by mouth at bedtime.     MAGNESIUM-OXIDE 400 (240 Mg) MG tablet Take 1 tablet by mouth daily.     metoCLOPramide (REGLAN) 10 MG tablet Take 1 tablet (10 mg total) by mouth every 6 (six) hours. 30 tablet 0   metoprolol succinate (TOPROL XL) 25 MG 24 hr tablet Take 0.5 tablets (12.5 mg total) by mouth at bedtime.     Multiple Vitamins-Minerals (MULTIVITAMIN WITH MINERALS) tablet Take 1 tablet by mouth daily.     nitroGLYCERIN (NITROSTAT) 0.4 MG SL tablet Place 1 tablet (0.4 mg total) under the tongue every  5 (five) minutes x 3 doses as needed for chest pain (if no relief after 2nd dose, proceed to the ED for an evaluation or call 911). 25 tablet 2   pantoprazole (PROTONIX) 40 MG tablet Take 40 mg by mouth 2 (two) times daily.     penicillin v potassium (VEETID) 500  MG tablet Take 1 tablet (500 mg total) by mouth 4 (four) times daily. 20 tablet 0   potassium chloride SA (KLOR-CON M) 20 MEQ tablet Take 2 tablets (40 mEq total) by mouth 2 (two) times daily. 120 tablet 6   rOPINIRole (REQUIP) 0.5 MG tablet Take 0.5 mg by mouth at bedtime.   0   sertraline (ZOLOFT) 50 MG tablet Take 1 tablet (50 mg total) by mouth daily. 30 tablet 3   spironolactone (ALDACTONE) 25 MG tablet Take 1 tablet (25 mg total) by mouth at bedtime. 30 tablet 3   traZODone (DESYREL) 50 MG tablet Take 1 tablet (50 mg total) by mouth at bedtime. 90 tablet 1   Vitamin D, Ergocalciferol, (DRISDOL) 1.25 MG (50000 UNIT) CAPS capsule Take 50,000 Units by mouth every 7 (seven) days.     No current facility-administered medications for this encounter.    REVIEW OF SYSTEMS:  A 10+ POINT REVIEW OF SYSTEMS WAS OBTAINED including neurology, dermatology, psychiatry, cardiac, respiratory, lymph, extremities, GI, GU, musculoskeletal, constitutional, reproductive, HEENT. ***   PHYSICAL EXAM:  vitals were not taken for this visit.   General: Alert and oriented, in no acute distress HEENT: Head is normocephalic. Extraocular movements are intact. Oropharynx is clear. Neck: Neck is supple, no palpable cervical or supraclavicular lymphadenopathy. Heart: Regular in rate and rhythm with no murmurs, rubs, or gallops. Chest: Clear to auscultation bilaterally, with no rhonchi, wheezes, or rales. Abdomen: Soft, nontender, nondistended, with no rigidity or guarding. Extremities: No cyanosis or edema. Lymphatics: see Neck Exam Skin: No concerning lesions. Musculoskeletal: symmetric strength and muscle tone throughout. Neurologic: Cranial nerves II through XII are grossly intact. No obvious focalities. Speech is fluent. Coordination is intact. Psychiatric: Judgment and insight are intact. Affect is appropriate. ***  ECOG = ***  0 - Asymptomatic (Fully active, able to carry on all predisease activities without  restriction)  1 - Symptomatic but completely ambulatory (Restricted in physically strenuous activity but ambulatory and able to carry out work of a light or sedentary nature. For example, light housework, office work)  2 - Symptomatic, <50% in bed during the day (Ambulatory and capable of all self care but unable to carry out any work activities. Up and about more than 50% of waking hours)  3 - Symptomatic, >50% in bed, but not bedbound (Capable of only limited self-care, confined to bed or chair 50% or more of waking hours)  4 - Bedbound (Completely disabled. Cannot carry on any self-care. Totally confined to bed or chair)  5 - Death   Santiago Glad MM, Creech RH, Tormey DC, et al. [phone]). "Toxicity and response criteria of the [facility]". Am. Evlyn Clines. Oncol. 5 (6): 649-55  LABORATORY DATA:  Lab Results  Component Value Date   WBC 10.3 07/07/2023   HGB 11.2 (L) 07/07/2023   HCT 33.8 (L) 07/07/2023   MCV 79.3 (L) 07/07/2023   PLT 448 (H) 07/07/2023   NEUTROABS 6.0 07/07/2023   Lab Results  Component Value Date   NA 140 07/18/2023   K 4.2 07/18/2023   CL 109 07/18/2023   CO2 22 07/18/2023   GLUCOSE 86 07/18/2023   BUN  16 07/18/2023   CREATININE 1.38 (H) 07/18/2023   CALCIUM 9.4 07/18/2023      RADIOGRAPHY: CUP PACEART INCLINIC DEVICE CHECK Result Date: 07/17/2023 Normal in-clinic ICD check. Thresholds, sensing, and impedance WNL or stable for patient over time. No new episodes. Estimated longevity 5yr69mo. Pt enrolled in remote follow-up.  DG Chest Port 1 View Result Date: 07/03/2023 CLINICAL DATA:  [id number] S/P bronchoscopy with biopsy [id number] EXAM: PORTABLE CHEST 1 VIEW COMPARISON:  04/28/2023 FINDINGS: Left-sided implanted cardiac device remains in place. Stable heart size. New fiducial marker in the left upper lobe with subtle adjacent opacity compatible with post biopsy changes. Lungs are otherwise clear. No pneumothorax. IMPRESSION: No pneumothorax  following bronchoscopy and biopsy. Electronically Signed   By: Duanne Guess D.O.   On: 07/03/2023 09:15   DG C-ARM BRONCHOSCOPY Result Date: 07/03/2023 C-ARM BRONCHOSCOPY: Fluoroscopy was utilized by the requesting physician.  No radiographic interpretation.      IMPRESSION: New diagnosis adenocarcinoma of the left upper lobe (HCC)  ***  Today, I talked to the patient and family about the findings and work-up thus far.  We discussed the natural history of *** and general treatment, highlighting the role of radiotherapy in the management.  We discussed the available radiation techniques, and focused on the details of logistics and delivery.  We reviewed the anticipated acute and late sequelae associated with radiation in this setting.  The patient was encouraged to ask questions that I answered to the best of my ability. *** A patient consent form was discussed and signed.  We retained a copy for our records.  The patient would like to proceed with radiation and will be scheduled for CT simulation.  PLAN: ***    *** minutes of total time was spent for this patient encounter, including preparation, face-to-face counseling with the patient and coordination of care, physical exam, and documentation of the encounter.   ------------------------------------------------  Billie Lade, PhD, MD  This document serves as a record of services personally performed by Antony Blackbird, MD. It was created on his behalf by Herbie Saxon, a trained medical scribe. The creation of this record is based on the scribe's personal observations and the provider's statements to them. This document has been checked and approved by the attending provider.

## 2023-07-26 ENCOUNTER — Encounter: Payer: Self-pay | Admitting: Radiation Oncology

## 2023-07-26 ENCOUNTER — Ambulatory Visit
Admission: RE | Admit: 2023-07-26 | Discharge: 2023-07-26 | Disposition: A | Discharge status: Home / Self Care | Source: Ambulatory Visit | Attending: Radiation Oncology | Admitting: Radiation Oncology

## 2023-07-26 VITALS — BP 106/69 | HR 78 | Temp 98.1°F | Resp 18 | Ht 60.0 in | Wt 105.6 lb

## 2023-07-26 DIAGNOSIS — I252 Old myocardial infarction: Secondary | ICD-10-CM | POA: Diagnosis not present

## 2023-07-26 DIAGNOSIS — I2510 Atherosclerotic heart disease of native coronary artery without angina pectoris: Secondary | ICD-10-CM | POA: Diagnosis not present

## 2023-07-26 DIAGNOSIS — Z7984 Long term (current) use of oral hypoglycemic drugs: Secondary | ICD-10-CM | POA: Diagnosis not present

## 2023-07-26 DIAGNOSIS — I5022 Chronic systolic (congestive) heart failure: Secondary | ICD-10-CM | POA: Insufficient documentation

## 2023-07-26 DIAGNOSIS — R112 Nausea with vomiting, unspecified: Secondary | ICD-10-CM | POA: Diagnosis not present

## 2023-07-26 DIAGNOSIS — F17210 Nicotine dependence, cigarettes, uncomplicated: Secondary | ICD-10-CM | POA: Diagnosis not present

## 2023-07-26 DIAGNOSIS — C3412 Malignant neoplasm of upper lobe, left bronchus or lung: Secondary | ICD-10-CM

## 2023-07-26 DIAGNOSIS — I110 Hypertensive heart disease with heart failure: Secondary | ICD-10-CM | POA: Diagnosis not present

## 2023-07-26 DIAGNOSIS — Z85828 Personal history of other malignant neoplasm of skin: Secondary | ICD-10-CM | POA: Diagnosis not present

## 2023-07-26 DIAGNOSIS — Z7902 Long term (current) use of antithrombotics/antiplatelets: Secondary | ICD-10-CM | POA: Diagnosis not present

## 2023-07-26 DIAGNOSIS — K589 Irritable bowel syndrome without diarrhea: Secondary | ICD-10-CM | POA: Diagnosis not present

## 2023-07-26 DIAGNOSIS — Z79899 Other long term (current) drug therapy: Secondary | ICD-10-CM | POA: Diagnosis not present

## 2023-07-26 DIAGNOSIS — I255 Ischemic cardiomyopathy: Secondary | ICD-10-CM | POA: Insufficient documentation

## 2023-07-26 DIAGNOSIS — E785 Hyperlipidemia, unspecified: Secondary | ICD-10-CM | POA: Insufficient documentation

## 2023-07-26 NOTE — Progress Notes (Signed)
 No ICM remote transmission received for 07/24/2023 and next ICM transmission scheduled for 08/17/2023.

## 2023-07-28 ENCOUNTER — Other Ambulatory Visit: Payer: Self-pay | Admitting: Medical Oncology

## 2023-07-28 DIAGNOSIS — D649 Anemia, unspecified: Secondary | ICD-10-CM

## 2023-07-31 ENCOUNTER — Inpatient Hospital Stay: Attending: Internal Medicine | Admitting: Internal Medicine

## 2023-07-31 ENCOUNTER — Inpatient Hospital Stay

## 2023-07-31 VITALS — BP 123/80 | HR 102 | Temp 98.1°F | Resp 17 | Wt 106.4 lb

## 2023-07-31 DIAGNOSIS — Z85828 Personal history of other malignant neoplasm of skin: Secondary | ICD-10-CM | POA: Diagnosis not present

## 2023-07-31 DIAGNOSIS — D649 Anemia, unspecified: Secondary | ICD-10-CM

## 2023-07-31 DIAGNOSIS — R634 Abnormal weight loss: Secondary | ICD-10-CM

## 2023-07-31 DIAGNOSIS — I509 Heart failure, unspecified: Secondary | ICD-10-CM

## 2023-07-31 DIAGNOSIS — Z87891 Personal history of nicotine dependence: Secondary | ICD-10-CM | POA: Diagnosis not present

## 2023-07-31 DIAGNOSIS — C3412 Malignant neoplasm of upper lobe, left bronchus or lung: Secondary | ICD-10-CM | POA: Insufficient documentation

## 2023-07-31 LAB — CBC WITH DIFFERENTIAL (CANCER CENTER ONLY)
Abs Immature Granulocytes: 0.05 10*3/uL (ref 0.00–0.07)
Basophils Absolute: 0.1 10*3/uL (ref 0.0–0.1)
Basophils Relative: 1 %
Eosinophils Absolute: 0 10*3/uL (ref 0.0–0.5)
Eosinophils Relative: 0 %
HCT: 36.2 % (ref 36.0–46.0)
Hemoglobin: 12 g/dL (ref 12.0–15.0)
Immature Granulocytes: 1 %
Lymphocytes Relative: 26 %
Lymphs Abs: 2 10*3/uL (ref 0.7–4.0)
MCH: 26.3 pg (ref 26.0–34.0)
MCHC: 33.1 g/dL (ref 30.0–36.0)
MCV: 79.4 fL — ABNORMAL LOW (ref 80.0–100.0)
Monocytes Absolute: 0.7 10*3/uL (ref 0.1–1.0)
Monocytes Relative: 9 %
Neutro Abs: 4.9 10*3/uL (ref 1.7–7.7)
Neutrophils Relative %: 63 %
Platelet Count: 519 10*3/uL — ABNORMAL HIGH (ref 150–400)
RBC: 4.56 MIL/uL (ref 3.87–5.11)
RDW: 17.6 % — ABNORMAL HIGH (ref 11.5–15.5)
WBC Count: 7.8 10*3/uL (ref 4.0–10.5)
nRBC: 0 % (ref 0.0–0.2)

## 2023-07-31 LAB — CMP (CANCER CENTER ONLY)
ALT: 15 U/L (ref 0–44)
AST: 22 U/L (ref 15–41)
Albumin: 4.4 g/dL (ref 3.5–5.0)
Alkaline Phosphatase: 40 U/L (ref 38–126)
Anion gap: 7 (ref 5–15)
BUN: 21 mg/dL (ref 8–23)
CO2: 29 mmol/L (ref 22–32)
Calcium: 10.2 mg/dL (ref 8.9–10.3)
Chloride: 102 mmol/L (ref 98–111)
Creatinine: 1.48 mg/dL — ABNORMAL HIGH (ref 0.44–1.00)
GFR, Estimated: 40 mL/min — ABNORMAL LOW (ref >60)
Glucose, Bld: 148 mg/dL — ABNORMAL HIGH (ref 70–99)
Potassium: 3.9 mmol/L (ref 3.5–5.1)
Sodium: 138 mmol/L (ref 135–145)
Total Bilirubin: 0.5 mg/dL (ref 0.0–1.2)
Total Protein: 7.2 g/dL (ref 6.5–8.1)

## 2023-07-31 NOTE — Progress Notes (Signed)
 [facility] [phone]   [phone]  CONSULT NOTE  REFERRING PHYSICIAN: Dr. Levy Pupa  REASON FOR CONSULTATION:  63 years old white female recently diagnosed with lung cancer  HPI [name] is a 63 y.o. female.   Discussed the use of AI scribe software for clinical note transcription with the patient, who gave verbal consent to proceed.  History of Present Illness   [name] is a 63 year old female with lung cancer who presents for evaluation of her condition.  She was diagnosed with lung cancer following an incidental finding of a lung nodule during an MRI for stomach issues in 2021. The nodule was monitored until late 2024 when it began to grow. A bronchoscopy on July 03, 2023, confirmed adenocarcinoma of the lung. A recent PET scan did not show concerning findings, and the area of concern did not appear prominently due to its ground glass opacity nature. No chest pain or breathing issues are reported currently.  She has experienced unintentional weight loss, dropping from 133 pounds to 106 pounds, attributed to a lack of appetite despite attempts to snack and consume nutritional supplements like Coronation instant breakfast. No nausea, vomiting, diarrhea, headaches, or changes in vision.  She underwent left shoulder surgery on March 21, 2023, with no complications from the procedure.  Her past medical history is significant for congestive heart failure, with an ejection fraction previously recorded at 35%, a heart attack in 2017, a brain aneurysm, basal cell carcinoma, squamous cell carcinoma, depression, acid reflux, high cholesterol, and surgical history including appendectomy, cholecystectomy, and hysterectomy. She has an AICD in place due to her cardiac condition.  Family history includes heart disease and high blood pressure in both parents, with her father also having had diabetes. There is no history of cancer in her  parents, but her paternal grandmother had breast cancer.  Social history reveals she is married with four children and previously worked as a Lawyer. She smoked for 15-16 years, quitting in September of the previous year, and denies alcohol use or illicit drug use.        Past Medical History:  Diagnosis Date   AICD (automatic cardioverter/defibrillator) present    Allergic reaction to alpha-gal    Allergy 03/05/2022   Anemia    Anxiety state 03/25/2016   Arthritis    Basal cell carcinoma of forehead    Brain aneurysm    CHF (congestive heart failure) (HCC)    Coronary artery disease    a. 03/11/16 PCI with DES-->Prox/Mid Cx;  b. 03/14/16 PCI with DES x2-->RCA, EF 30-35%.   Depression 06/09/2010   Encounter for general adult medical examination with abnormal findings 05/04/2022   Essential hypertension    Hx   GERD (gastroesophageal reflux disease)    HFrEF (heart failure with reduced ejection fraction) (HCC)    a. 10/2016 Echo: EF 35-40%, Gr1 DD, mild focal basal septal hypertrophy, basal inflat, mid inflat, basal antlat AK. Mid infept/inf/antlat, apical lateral sev HK. Mod MR. mildly reduced RV fxn. Mild TR.   History of pneumonia    Hyperlipidemia    IBS (irritable bowel syndrome)    Ischemic cardiomyopathy    a. 10/2016 Echo: EF 35-40%, Gr1 DD.   Lung cancer [facility])    Mitral regurgitation    Neuromuscular disorder (HCC)    NSTEMI (non-ST elevated myocardial infarction) (HCC) 03/10/2016   Pneumonia 03/2016   Shingles    Squamous cell cancer of skin of nose  Thrombocytosis 03/26/2016   Tobacco abuse    Trichimoniasis    Wears dentures    Wears glasses     Past Surgical History:  Procedure Laterality Date   APPENDECTOMY     BICEPT TENODESIS Left 03/21/2023   Procedure: BICEPS TENODESIS;  Surgeon: Cammy Copa, MD;  Location: [facility] OR;  Service: Orthopedics;  Laterality: Left;   BIOPSY  09/20/2018   Procedure: BIOPSY;  Surgeon: Corbin Ade, MD;  [facility];  Service: Endoscopy;;  colon   BIOPSY  01/05/2021   Procedure: BIOPSY;  Surgeon: Rachael Fee, MD;  Location: [facility] ENDOSCOPY;  Service: Endoscopy;;   BIOPSY  04/27/2022   Procedure: BIOPSY;  Surgeon: Lanelle Bal, DO;  [facility];  Service: Endoscopy;;   BRONCHIAL BIOPSY  07/03/2023   Procedure: BRONCHIAL BIOPSIES;  Surgeon: Leslye Peer, MD;  Location: [facility] ENDOSCOPY;  Service: Pulmonary;;   BRONCHIAL BRUSHINGS  07/03/2023   Procedure: BRONCHIAL BRUSHINGS;  Surgeon: Leslye Peer, MD;  Location: [facility] ENDOSCOPY;  Service: Pulmonary;;   BRONCHIAL NEEDLE ASPIRATION BIOPSY  07/03/2023   Procedure: BRONCHIAL NEEDLE ASPIRATION BIOPSIES;  Surgeon: Leslye Peer, MD;  Location: [facility] ENDOSCOPY;  Service: Pulmonary;;   BRONCHIAL WASHINGS  07/03/2023   Procedure: BRONCHIAL WASHINGS;  Surgeon: Leslye Peer, MD;  Location: [facility] ENDOSCOPY;  Service: Pulmonary;;   CARDIAC CATHETERIZATION N/A 03/11/2016   Procedure: Left Heart Cath and Coronary Angiography;  Surgeon: Marykay Lex, MD;  Location: [facility] INVASIVE CV LAB;  Service: Cardiovascular;  Laterality: N/A;   CARDIAC CATHETERIZATION N/A 03/11/2016   Procedure: Coronary Stent Intervention;  Surgeon: Marykay Lex, MD;  Location: [facility] INVASIVE CV LAB;  Service: Cardiovascular;  Laterality: N/A;   CARDIAC CATHETERIZATION N/A 03/14/2016   Procedure: Coronary Stent Intervention;  Surgeon: Peter M Swaziland, MD;  Location: [facility] INVASIVE CV LAB;  Service: Cardiovascular;  Laterality: N/A;   CEREBRAL ANEURYSM REPAIR  04/2019   stent placed   CHOLECYSTECTOMY OPEN  1984   COLONOSCOPY WITH PROPOFOL N/A 09/20/2018   Procedure: COLONOSCOPY WITH PROPOFOL;  Surgeon: Corbin Ade, MD;  [facility];  Service: Endoscopy;  Laterality: N/A;  10:30am   CORONARY ANGIOPLASTY WITH STENT PLACEMENT  03/14/2016   ESOPHAGOGASTRODUODENOSCOPY (EGD) WITH PROPOFOL N/A 09/20/2018   Procedure: ESOPHAGOGASTRODUODENOSCOPY (EGD) WITH PROPOFOL;   Surgeon: Corbin Ade, MD;  [facility];  Service: Endoscopy;  Laterality: N/A;   ESOPHAGOGASTRODUODENOSCOPY (EGD) WITH PROPOFOL N/A 01/05/2021   Procedure: ESOPHAGOGASTRODUODENOSCOPY (EGD) WITH PROPOFOL;  Surgeon: Rachael Fee, MD;  Location: [facility] ENDOSCOPY;  Service: Endoscopy;  Laterality: N/A;   ESOPHAGOGASTRODUODENOSCOPY (EGD) WITH PROPOFOL N/A 04/27/2022   Procedure: ESOPHAGOGASTRODUODENOSCOPY (EGD) WITH PROPOFOL;  Surgeon: Lanelle Bal, DO;  [facility];  Service: Endoscopy;  Laterality: N/A;  9:15am, asa 3/4, ASAP   ESOPHAGOGASTRODUODENOSCOPY (EGD) WITH PROPOFOL N/A 01/04/2023   Procedure: ESOPHAGOGASTRODUODENOSCOPY (EGD) WITH PROPOFOL;  Surgeon: Corbin Ade, MD;  [facility];  Service: Endoscopy;  Laterality: N/A;  130pm, asa 3   FIDUCIAL MARKER PLACEMENT  07/03/2023   Procedure: FIDUCIAL MARKER PLACEMENT;  Surgeon: Leslye Peer, MD;  Location: [facility] ENDOSCOPY;  Service: Pulmonary;;   FINGER ARTHROPLASTY Left 05/14/2013   Procedure: LEFT THUMB CARPAL METACARPAL ARTHROPLASTY;  Surgeon: Tami Ribas, MD;  Location: [facility];  Service: Orthopedics;  Laterality: Left;   ICD IMPLANT N/A 04/03/2020   Procedure: ICD IMPLANT;  Surgeon: Regan Lemming, MD;  Location: [facility]  INVASIVE CV LAB;  Service: Cardiovascular;  Laterality: N/A;   IR ANGIO INTRA EXTRACRAN SEL COM CAROTID INNOMINATE BILAT MOD SED  01/05/2017   IR ANGIO INTRA EXTRACRAN SEL COM CAROTID INNOMINATE BILAT MOD SED  03/19/2019   IR ANGIO INTRA EXTRACRAN SEL COM CAROTID INNOMINATE BILAT MOD SED  06/04/2020   IR ANGIO INTRA EXTRACRAN SEL INTERNAL CAROTID UNI L MOD SED  04/15/2019   IR ANGIO VERTEBRAL SEL VERTEBRAL BILAT MOD SED  01/05/2017   IR ANGIO VERTEBRAL SEL VERTEBRAL BILAT MOD SED  03/19/2019   IR ANGIO VERTEBRAL SEL VERTEBRAL UNI L MOD SED  06/04/2020   IR ANGIOGRAM FOLLOW UP STUDY  04/15/2019   IR RADIOLOGIST EVAL & MGMT  12/30/2016   IR TRANSCATH/EMBOLIZ   04/15/2019   IR US GUIDE VASC ACCESS RIGHT  03/19/2019   IR US GUIDE VASC ACCESS RIGHT  06/04/2020   JOINT REPLACEMENT     MALONEY DILATION N/A 09/20/2018   Procedure: Elease Hashimoto DILATION;  Surgeon: Corbin Ade, MD;  [facility];  Service: Endoscopy;  Laterality: N/A;   MALONEY DILATION N/A 01/04/2023   Procedure: Elease Hashimoto DILATION;  Surgeon: Corbin Ade, MD;  [facility];  Service: Endoscopy;  Laterality: N/A;   RADIOLOGY WITH ANESTHESIA N/A 04/15/2019   Procedure: Sharman Crate;  Surgeon: Julieanne Cotton, MD;  Location: [facility] OR;  Service: Radiology;  Laterality: N/A;   REVERSE SHOULDER ARTHROPLASTY Left 03/21/2023   Procedure: LEFT REVERSE SHOULDER ARTHROPLASTY;  Surgeon: Cammy Copa, MD;  Location: [facility] OR;  Service: Orthopedics;  Laterality: Left;   RIGHT/LEFT HEART CATH AND CORONARY ANGIOGRAPHY N/A 08/19/2019   Procedure: RIGHT/LEFT HEART CATH AND CORONARY ANGIOGRAPHY;  Surgeon: Laurey Morale, MD;  Location: [name] INVASIVE CV LAB;  Service: Cardiovascular;  Laterality: N/A;   TUBAL LIGATION  1987   VAGINAL HYSTERECTOMY  2009    Family History  Problem Relation Age of Onset   Stroke Mother    Hypertension Mother    Diabetes Mother    Heart attack Mother    Heart attack Father    Diabetes Father    Hypertension Father    CAD Father    Colon polyps Father 81       pre-cancerous    Stroke Father    Dementia Father    Hyperlipidemia Father    Arthritis Father    COPD Father        smoked and worked [employer]   Heart disease Father    Breast cancer Maternal Grandmother    Diabetes Maternal Grandmother    Cancer Maternal Grandfather        Tongue and esophageal   Cancer Paternal Grandmother    Anxiety disorder Daughter    Depression Daughter    Anxiety disorder Daughter    Heart failure Other    Colon cancer Neg Hx     Social History Social History   Tobacco Use   Smoking status: Former    Current packs/day: 1.50    Average  packs/day: 1.5 packs/day for 40.0 years (60.0 ttl pk-yrs)    Types: Cigarettes   Smokeless tobacco: Never   Tobacco comments:    Smokes off and on 06/14/23 [name], CMA     No cigarettes since Thursday this week>> 07/06/2023  Vaping Use   Vaping status: Never Used  Substance Use Topics   Alcohol use: Not Currently    Comment: occasionally   Drug use: Not Currently    Types: Marijuana  Allergies  Allergen Reactions   Alpha-Gal Shortness Of Breath, Nausea And Vomiting and Dermatitis   Other Shortness Of Breath, Diarrhea, Nausea And Vomiting and Nausea Only    All- red meats/dairy  Medications in Capsule form    Tape Other (See Comments)    PEELS SKIN OFF  (PAPER TAPE IS FINE)    Current Outpatient Medications  Medication Sig Dispense Refill   aspirin EC 81 MG tablet Take 1 tablet (81 mg total) by mouth every evening. Okay to restart on 2/29/2025     atorvastatin (LIPITOR) 80 MG tablet Take 1 tablet (80 mg total) by mouth daily. 90 tablet 3   clopidogrel (PLAVIX) 75 MG tablet Take 1 tablet (75 mg total) by mouth daily. Okay to restart on 07/04/2023     dapagliflozin propanediol (FARXIGA) 10 MG TABS tablet Take 1 tablet (10 mg total) by mouth daily before breakfast. 30 tablet 11   EPINEPHrine (EPIPEN 2-PAK) 0.3 mg/0.3 mL IJ SOAJ injection Inject 0.3 mg into the muscle as needed for anaphylaxis. 2 each 2   Evolocumab (REPATHA SURECLICK) 140 MG/ML SOAJ Inject 140 mg into the skin every 14 (fourteen) days. 6 mL 3   fenofibrate (TRICOR) 145 MG tablet Take 1 tablet (145 mg total) by mouth daily. 90 tablet 3   furosemide (LASIX) 20 MG tablet Take 2 tablets (40 mg total) by mouth 2 (two) times daily. 360 tablet 3   gabapentin (NEURONTIN) 300 MG capsule Take 1 capsule by mouth at bedtime 30 capsule 0   hydrOXYzine (VISTARIL) 25 MG capsule Take 1 capsule (25 mg total) by mouth every 8 (eight) hours as needed. 30 capsule 1   linaclotide (LINZESS) 145 MCG CAPS capsule Take 1 capsule (145  mcg total) by mouth daily before breakfast. 30 capsule 11   loperamide (IMODIUM A-D) 2 MG tablet Take 2 mg by mouth 4 (four) times daily as needed for diarrhea or loose stools.     losartan (COZAAR) 25 MG tablet Take 0.5 tablets (12.5 mg total) by mouth at bedtime.     MAGNESIUM-OXIDE 400 (240 Mg) MG tablet Take 1 tablet by mouth daily.     metoCLOPramide (REGLAN) 10 MG tablet Take 1 tablet (10 mg total) by mouth every 6 (six) hours. 30 tablet 0   metoprolol succinate (TOPROL XL) 25 MG 24 hr tablet Take 0.5 tablets (12.5 mg total) by mouth at bedtime.     Multiple Vitamins-Minerals (MULTIVITAMIN WITH MINERALS) tablet Take 1 tablet by mouth daily.     nitroGLYCERIN (NITROSTAT) 0.4 MG SL tablet Place 1 tablet (0.4 mg total) under the tongue every 5 (five) minutes x 3 doses as needed for chest pain (if no relief after 2nd dose, proceed to the ED for an evaluation or call 911). 25 tablet 2   pantoprazole (PROTONIX) 40 MG tablet Take 40 mg by mouth 2 (two) times daily.     penicillin v potassium (VEETID) 500 MG tablet Take 1 tablet (500 mg total) by mouth 4 (four) times daily. (Patient not taking: Reported on 07/26/2023) 20 tablet 0   potassium chloride SA (KLOR-CON M) 20 MEQ tablet Take 2 tablets (40 mEq total) by mouth 2 (two) times daily. 120 tablet 6   rOPINIRole (REQUIP) 0.5 MG tablet Take 0.5 mg by mouth at bedtime.   0   sertraline (ZOLOFT) 50 MG tablet Take 1 tablet (50 mg total) by mouth daily. 30 tablet 3   spironolactone (ALDACTONE) 25 MG tablet Take 1 tablet (25 mg total) by  mouth at bedtime. 30 tablet 3   traZODone (DESYREL) 50 MG tablet Take 1 tablet (50 mg total) by mouth at bedtime. 90 tablet 1   Vitamin D, Ergocalciferol, (DRISDOL) 1.25 MG (50000 UNIT) CAPS capsule Take 50,000 Units by mouth every 7 (seven) days.     No current facility-administered medications for this visit.    Review of Systems  Constitutional: positive for fatigue Eyes: negative Ears, nose, mouth, throat, and  face: negative Respiratory: negative Cardiovascular: negative Gastrointestinal: negative Genitourinary:negative Integument/breast: negative Hematologic/lymphatic: negative Musculoskeletal:negative Neurological: negative Behavioral/Psych: negative Endocrine: negative Allergic/Immunologic: negative  Physical Exam  OZH:YQMVH, healthy, no distress, well nourished, and well developed SKIN: skin color, texture, turgor are normal, no rashes or significant lesions HEAD: Normocephalic, No masses, lesions, tenderness or abnormalities EYES: normal, PERRLA, Conjunctiva are pink and non-injected EARS: External ears normal, Canals clear OROPHARYNX:no exudate, no erythema, and lips, buccal mucosa, and tongue normal  NECK: supple, no adenopathy, no JVD LYMPH:  no palpable lymphadenopathy, no hepatosplenomegaly BREAST:not examined LUNGS: clear to auscultation , and palpation HEART: regular rate & rhythm, no murmurs, and no gallops ABDOMEN:abdomen soft, non-tender, normal bowel sounds, and no masses or organomegaly BACK: Back symmetric, no curvature., No CVA tenderness EXTREMITIES:no joint deformities, effusion, or inflammation, no edema  NEURO: alert & oriented x 3 with fluent speech, no focal motor/sensory deficits  PERFORMANCE STATUS: ECOG 1  LABORATORY DATA: Lab Results  Component Value Date   WBC 7.8 07/31/2023   HGB 12.0 07/31/2023   HCT 36.2 07/31/2023   MCV 79.4 (L) 07/31/2023   PLT 519 (H) 07/31/2023      Chemistry      Component Value Date/Time   NA 140 07/18/2023 1052   NA 134 06/30/2022 0815   K 4.2 07/18/2023 1052   CL 109 07/18/2023 1052   CO2 22 07/18/2023 1052   BUN 16 07/18/2023 1052   BUN 25 06/30/2022 0815   CREATININE 1.38 (H) 07/18/2023 1052   CREATININE 0.73 05/13/2016 1020      Component Value Date/Time   CALCIUM 9.4 07/18/2023 1052   ALKPHOS 38 06/18/2023 1007   AST 43 (H) 06/18/2023 1007   ALT 26 06/18/2023 1007   BILITOT 0.7 06/18/2023 1007    BILITOT 0.4 10/31/2022 1119       RADIOGRAPHIC STUDIES: CUP PACEART INCLINIC DEVICE CHECK Result Date: 07/17/2023 Normal in-clinic ICD check. Thresholds, sensing, and impedance WNL or stable for patient over time. No new episodes. Estimated longevity 33yr51mo. Pt enrolled in remote follow-up.  DG Chest Port 1 View Result Date: 07/03/2023 CLINICAL DATA:  [id number] S/P bronchoscopy with biopsy [id number] EXAM: PORTABLE CHEST 1 VIEW COMPARISON:  04/28/2023 FINDINGS: Left-sided implanted cardiac device remains in place. Stable heart size. New fiducial marker in the left upper lobe with subtle adjacent opacity compatible with post biopsy changes. Lungs are otherwise clear. No pneumothorax. IMPRESSION: No pneumothorax following bronchoscopy and biopsy. Electronically Signed   By: Duanne Guess D.O.   On: 07/03/2023 09:15   DG C-ARM BRONCHOSCOPY Result Date: 07/03/2023 C-ARM BRONCHOSCOPY: Fluoroscopy was utilized by the requesting physician.  No radiographic interpretation.    ASSESSMENT AND PLAN:     Stage 1A (T1b, N0, M0) adenocarcinoma of the lung diagnosed in February 2025. Stage 1A non-small cell lung cancer, specifically adenocarcinoma, located in the upper left lung, presenting as a 1.7 cm ground glass opacity. PET scan report is still pending but imaging review showed no concerning findings, consistent with ground glass opacities. Primary treatment is surgical resection, but  cardiac condition may affect surgical candidacy. If surgery is not feasible, radiation therapy is the alternative. Goal is curative treatment; chemotherapy or targeted therapy is not indicated at this stage. Surgery is preferred if tolerated; otherwise, radiation will be considered, with potential need to move the defibrillator. - Order pulmonary function test - Refer to thoracic surgeon for surgical evaluation - If surgery is not feasible, refer to radiation oncologist - Coordinate with cardiology regarding potential  defibrillator relocation if radiation is required  Congestive heart failure Congestive heart failure with previous ejection fraction of 35%, potentially impacting surgical candidacy for lung cancer treatment. Echocardiogram scheduled next week to assess current cardiac function. - Review echocardiogram results to assess cardiac function  Unintentional weight loss Unintentional weight loss from 133 lbs to 106 lbs due to lack of appetite. Attempts to manage with snacks and nutritional supplements have not resulted in weight gain. - Monitor weight and nutritional intake  Basal cell carcinoma and squamous cell carcinoma of the skin History of basal cell carcinoma and squamous cell carcinoma of the skin. No active issues at this time.  Follow-up Follow-up depends on surgical evaluation outcome. Post-operative follow-up if surgery is performed. If surgery is not feasible, further evaluation and potential referral to radiation oncology. - Schedule follow-up after surgical evaluation or post-surgery - If surgery is not performed, schedule follow-up for radiation therapy planning   The patient was advised to call immediately if she has any other concerning symptoms in the interval. The patient voices understanding of current disease status and treatment options and is in agreement with the current care plan.  All questions were answered. The patient knows to call the clinic with any problems, questions or concerns. We can certainly see the patient much sooner if necessary.  Thank you so much for allowing me to participate in the care of [name]. I will continue to follow up the patient with you and assist in her care.  The total time spent in the appointment was 60 minutes.  Disclaimer: This note was dictated with voice recognition software. Similar sounding words can inadvertently be transcribed and may not be corrected upon review.   Lajuana Matte July 31, 2023, 2:20 PM

## 2023-08-02 ENCOUNTER — Encounter: Payer: Self-pay | Admitting: Cardiology

## 2023-08-02 ENCOUNTER — Ambulatory Visit: Payer: Medicare Other | Admitting: Dermatology

## 2023-08-02 LAB — FUNGUS CULTURE WITH STAIN

## 2023-08-02 LAB — FUNGUS CULTURE RESULT

## 2023-08-02 LAB — FUNGAL ORGANISM REFLEX

## 2023-08-02 NOTE — Progress Notes (Signed)
 Email sent to [facility] and Lorinda Creed, [facility] RT[facility], requesting pt be scheduled for PFTs.

## 2023-08-02 NOTE — Progress Notes (Signed)
 I met the pt today face to face at her consult with Dr Arbutus Ped. The plan for the pt is a referral to CT surgery for evaluation. Pt will need PFTs prior to her CT surgery consult. I explained to the pt that the pulmonary function tests will assess how well her lungs are functioning now and help predict how well they may function after surgery. I explained the test will take place at [facility] and carried out by respiratory therapists, and the pt can't have any caffeine, can't smoke, and can't use inhalers prior to the test. I let the pt know I will reach out to [facility] to help schedule her PFTs prior to her CT surgery consult. Pt verbalized understanding of the plan and has no questions at this time. I provided my card to the pt and encouraged her to call me with any questions or concerns.

## 2023-08-02 NOTE — Progress Notes (Signed)
 Pt notified that her PFTs have been scheduled for 11am on 4/2 at [facility]. Reviewed instructions--no caffeine, no smoking, and no inhalers-prior to the tests. Pt verbalized understanding.

## 2023-08-03 ENCOUNTER — Ambulatory Visit (HOSPITAL_COMMUNITY)
Admission: RE | Admit: 2023-08-03 | Discharge: 2023-08-03 | Disposition: A | Discharge status: Home / Self Care | Source: Ambulatory Visit | Attending: Cardiology | Admitting: Cardiology

## 2023-08-03 DIAGNOSIS — I429 Cardiomyopathy, unspecified: Secondary | ICD-10-CM | POA: Insufficient documentation

## 2023-08-03 DIAGNOSIS — I358 Other nonrheumatic aortic valve disorders: Secondary | ICD-10-CM | POA: Insufficient documentation

## 2023-08-03 DIAGNOSIS — I110 Hypertensive heart disease with heart failure: Secondary | ICD-10-CM | POA: Insufficient documentation

## 2023-08-03 DIAGNOSIS — Z006 Encounter for examination for normal comparison and control in clinical research program: Secondary | ICD-10-CM

## 2023-08-03 DIAGNOSIS — I5022 Chronic systolic (congestive) heart failure: Secondary | ICD-10-CM | POA: Diagnosis present

## 2023-08-03 DIAGNOSIS — I3139 Other pericardial effusion (noninflammatory): Secondary | ICD-10-CM | POA: Insufficient documentation

## 2023-08-03 DIAGNOSIS — Z87891 Personal history of nicotine dependence: Secondary | ICD-10-CM | POA: Insufficient documentation

## 2023-08-03 LAB — ECHOCARDIOGRAM COMPLETE
AR max vel: 2.12 cm2
AV Area VTI: 2.22 cm2
AV Area mean vel: 1.97 cm2
AV Mean grad: 3 mmHg
AV Peak grad: 4.8 mmHg
Ao pk vel: 1.1 m/s
Area-P 1/2: 4.57 cm2
Calc EF: 40.2 %
MV VTI: 2.09 cm2
S' Lateral: 4.4 cm
Single Plane A2C EF: 37.9 %
Single Plane A4C EF: 38.6 %

## 2023-08-03 NOTE — Research (Signed)
 SITE: [id number]     Subject # [id number]  Subprotocol: A  Inclusion Criteria  Patients who meet all of the following criteria are eligible for enrollment as study participants:  Yes No  Age > 62 years old X   Eligible to wear Holter Study X    Exclusion Criteria  Patients who meet any of these criteria are not eligible for enrollment as study participants: Yes No  1. Receiving any mechanical (respiratory or circulatory) or renal support therapy at Screening or during Visit #1.  X  2.  Any other conditions that in the opinion of the investigators are likely to prevent compliance with the study protocol or pose a safety concern if the subject participates in the study.  X  3. Poor tolerance, namely susceptible to severe skin allergies from ECG adhesive patch application.  X   Protocol: REV H                                     Residential Zip code [phone] (First 3 digits ONLY)                                             [facility] Informed Consent   Subject Name: Erin [name]  Subject met inclusion and exclusion criteria.  The informed consent form, study requirements and expectations were reviewed with the subject. Subject had opportunity to read consent and questions and concerns were addressed prior to the signing of the consent form.  The subject verbalized understanding of the trial requirements.  The subject agreed to participate in the [facility] EF ACT trial and signed the informed consent at 11:52 on 03-Aug-2023.  The informed consent was obtained prior to performance of any protocol-specific procedures for the subject.  A copy of the signed informed consent was given to the subject and a copy was placed in the subject's medical record.   Erin [name]          Current Outpatient Medications:    aspirin EC 81 MG tablet, Take 1 tablet (81 mg total) by mouth every evening. Okay to restart on 2/29/2025, Disp: , Rfl:    atorvastatin (LIPITOR) 80 MG tablet, Take 1 tablet (80 mg total) by  mouth daily., Disp: 90 tablet, Rfl: 3   clopidogrel (PLAVIX) 75 MG tablet, Take 1 tablet (75 mg total) by mouth daily. Okay to restart on 07/04/2023, Disp: , Rfl:    dapagliflozin propanediol (FARXIGA) 10 MG TABS tablet, Take 1 tablet (10 mg total) by mouth daily before breakfast., Disp: 30 tablet, Rfl: 11   EPINEPHrine (EPIPEN 2-PAK) 0.3 mg/0.3 mL IJ SOAJ injection, Inject 0.3 mg into the muscle as needed for anaphylaxis., Disp: 2 each, Rfl: 2   Evolocumab (REPATHA SURECLICK) 140 MG/ML SOAJ, Inject 140 mg into the skin every 14 (fourteen) days., Disp: 6 mL, Rfl: 3   fenofibrate (TRICOR) 145 MG tablet, Take 1 tablet (145 mg total) by mouth daily., Disp: 90 tablet, Rfl: 3   furosemide (LASIX) 20 MG tablet, Take 2 tablets (40 mg total) by mouth 2 (two) times daily., Disp: 360 tablet, Rfl: 3   gabapentin (NEURONTIN) 300 MG capsule, Take 1 capsule by mouth at bedtime, Disp: 30 capsule, Rfl: 0   hydrOXYzine (VISTARIL) 25 MG capsule, Take 1 capsule (25 mg total)  by mouth every 8 (eight) hours as needed., Disp: 30 capsule, Rfl: 1   linaclotide (LINZESS) 145 MCG CAPS capsule, Take 1 capsule (145 mcg total) by mouth daily before breakfast., Disp: 30 capsule, Rfl: 11   loperamide (IMODIUM A-D) 2 MG tablet, Take 2 mg by mouth 4 (four) times daily as needed for diarrhea or loose stools., Disp: , Rfl:    losartan (COZAAR) 25 MG tablet, Take 0.5 tablets (12.5 mg total) by mouth at bedtime., Disp: , Rfl:    MAGNESIUM-OXIDE 400 (240 Mg) MG tablet, Take 1 tablet by mouth daily., Disp: , Rfl:    metoCLOPramide (REGLAN) 10 MG tablet, Take 1 tablet (10 mg total) by mouth every 6 (six) hours., Disp: 30 tablet, Rfl: 0   metoprolol succinate (TOPROL XL) 25 MG 24 hr tablet, Take 0.5 tablets (12.5 mg total) by mouth at bedtime., Disp: , Rfl:    Multiple Vitamins-Minerals (MULTIVITAMIN WITH MINERALS) tablet, Take 1 tablet by mouth daily., Disp: , Rfl:    nitroGLYCERIN (NITROSTAT) 0.4 MG SL tablet, Place 1 tablet (0.4 mg total)  under the tongue every 5 (five) minutes x 3 doses as needed for chest pain (if no relief after 2nd dose, proceed to the ED for an evaluation or call 911)., Disp: 25 tablet, Rfl: 2   pantoprazole (PROTONIX) 40 MG tablet, Take 40 mg by mouth 2 (two) times daily., Disp: , Rfl:    penicillin v potassium (VEETID) 500 MG tablet, Take 1 tablet (500 mg total) by mouth 4 (four) times daily. (Patient not taking: Reported on 07/26/2023), Disp: 20 tablet, Rfl: 0   potassium chloride SA (KLOR-CON M) 20 MEQ tablet, Take 2 tablets (40 mEq total) by mouth 2 (two) times daily., Disp: 120 tablet, Rfl: 6   rOPINIRole (REQUIP) 0.5 MG tablet, Take 0.5 mg by mouth at bedtime. , Disp: , Rfl: 0   sertraline (ZOLOFT) 50 MG tablet, Take 1 tablet (50 mg total) by mouth daily., Disp: 30 tablet, Rfl: 3   spironolactone (ALDACTONE) 25 MG tablet, Take 1 tablet (25 mg total) by mouth at bedtime., Disp: 30 tablet, Rfl: 3   traZODone (DESYREL) 50 MG tablet, Take 1 tablet (50 mg total) by mouth at bedtime., Disp: 90 tablet, Rfl: 1   Vitamin D, Ergocalciferol, (DRISDOL) 1.25 MG (50000 UNIT) CAPS capsule, Take 50,000 Units by mouth every 7 (seven) days., Disp: , Rfl:

## 2023-08-07 NOTE — H&P (View-Only) (Signed)
 GI Office Note    Referring Provider: [name], MD Primary Care Physician:  [name], MD  Primary Gastroenterologist: Rheba Cedar, MD   Chief Complaint   Chief Complaint  Patient presents with   Follow-up    Having some nausea no vomiting.     History of Present Illness   Erin  CHELSA [name] is a 63 y.o. female presenting today for follow up. Last seen 07/2023. H/o weight loss, postprandial N/V, abd pain. Positive alpha gal. Reported multiple allergies based on testing. ACTH  test previously unrevealing. Fasting AM cortisol previously low with subsequent assay last year withing normal limites. Recent diagnosis of non-small cell lung cancer.   Negative workup in the ED recently for due to "dehydration".  Started on Reglan  10 mg orally 4 times daily.  States its to help with the nausea somewhat but she is now having involuntary movements of her left upper extremity.   05/2021: GES normal previously.  07/2023:Fasting am cortisol 21.5. TTG IgA <2, IgA 176.   PET 07/2023 IMPRESSION: -Ground-glass like area at the left lung apex is again seen and similar to previous CT scan. There is now fiduciary marker in this location. No specific abnormal radiotracer uptake. Please correlate with results from prior intervention. Otherwise based on today's examination, simple follow up is recommended in 3 months with CT. -Slight asymmetric area of uptake along the anorectal region. This could be a physiologic variant but please correlate with clinical findings to exclude lesion. -Incidental findings identified including bilateral adrenal adenomas. -3.1 cm infrarenal abdominal aortic aneurysm. Recommend follow-up ultrasound every 3 years. (Ref.: J Vasc Surg. 2018; 67:2-77 and J Am Coll Radiol 2013;10(10):789-794.)  Today: continue with flares of N/V. May have few good weeks, during this time, eats well, gains weight, BMs good. But then may have couple of weeks of N/V, unable to  eat, lose weight. At her lowest 101 pounds. Currently at 110. Continues to avoid red meats. Does not eat dairy out of preference. She is taking Zofran  only when vomiting started. She typically lays down when she just has nausea. Usually knows when spell is coming, will have some abdominal pain for 1-2 days with nausea, then develops vomiting. Uses linzess  at times. No melena, brbpr. Heartburn well controlled. She has previously linked her N/V with anxiety.       EGD 12/2022: -normal esophagus s/p dilation  Colonoscopy 09/2018: -norml ileocolonoscopy -segmental bopsies negative.  -10 year follow up  Medications   Current Outpatient Medications  Medication Sig Dispense Refill   aspirin  EC 81 MG tablet Take 1 tablet (81 mg total) by mouth every evening. Okay to restart on 2/29/2025     atorvastatin  (LIPITOR ) 80 MG tablet Take 1 tablet (80 mg total) by mouth daily. 90 tablet 3   clopidogrel  (PLAVIX ) 75 MG tablet Take 1 tablet (75 mg total) by mouth daily. Okay to restart on 07/04/2023     dapagliflozin  propanediol (FARXIGA ) 10 MG TABS tablet Take 1 tablet (10 mg total) by mouth daily before breakfast. 30 tablet 11   EPINEPHrine  (EPIPEN  2-PAK) 0.3 mg/0.3 mL IJ SOAJ injection Inject 0.3 mg into the muscle as needed for anaphylaxis. 2 each 2   Evolocumab  (REPATHA  SURECLICK) 140 MG/ML SOAJ Inject 140 mg into the skin every 14 (fourteen) days. 6 mL 3   fenofibrate  (TRICOR ) 145 MG tablet Take 1 tablet (145 mg total) by mouth daily. 90 tablet 3   furosemide  (LASIX ) 20 MG tablet Take 2 tablets (40  mg total) by mouth 2 (two) times daily. 360 tablet 3   gabapentin  (NEURONTIN ) 300 MG capsule Take 1 capsule by mouth at bedtime 30 capsule 0   hydrOXYzine  (VISTARIL ) 25 MG capsule Take 1 capsule (25 mg total) by mouth every 8 (eight) hours as needed. 30 capsule 1   linaclotide  (LINZESS ) 145 MCG CAPS capsule Take 1 capsule (145 mcg total) by mouth daily before breakfast. 30 capsule 11   loperamide  (IMODIUM  A-D) 2  MG tablet Take 2 mg by mouth 4 (four) times daily as needed for diarrhea or loose stools.     losartan  (COZAAR ) 25 MG tablet Take 0.5 tablets (12.5 mg total) by mouth at bedtime.     MAGNESIUM -OXIDE 400 (240 Mg) MG tablet Take 1 tablet by mouth daily.     metoprolol  succinate (TOPROL  XL) 25 MG 24 hr tablet Take 0.5 tablets (12.5 mg total) by mouth at bedtime.     Multiple Vitamins-Minerals (MULTIVITAMIN WITH MINERALS) tablet Take 1 tablet by mouth daily.     nicotine  (NICODERM CQ  - DOSED IN MG/24 HOURS) 21 mg/24hr patch Place 21 mg onto the skin daily.     nitroGLYCERIN  (NITROSTAT ) 0.4 MG SL tablet Place 1 tablet (0.4 mg total) under the tongue every 5 (five) minutes x 3 doses as needed for chest pain (if no relief after 2nd dose, proceed to the ED for an evaluation or call 911). 25 tablet 2   pantoprazole  (PROTONIX ) 40 MG tablet Take 40 mg by mouth daily.     potassium chloride  SA (KLOR-CON  M) 20 MEQ tablet Take 2 tablets (40 mEq total) by mouth 2 (two) times daily. 120 tablet 6   rOPINIRole  (REQUIP ) 0.5 MG tablet Take 0.5 mg by mouth at bedtime.   0   sertraline  (ZOLOFT ) 50 MG tablet Take 1 tablet (50 mg total) by mouth daily. 30 tablet 3   spironolactone  (ALDACTONE ) 25 MG tablet Take 1 tablet (25 mg total) by mouth at bedtime. 30 tablet 3   traZODone  (DESYREL ) 50 MG tablet Take 1 tablet (50 mg total) by mouth at bedtime. 90 tablet 1   Vitamin D , Ergocalciferol , (DRISDOL) 1.25 MG (50000 UNIT) CAPS capsule Take 50,000 Units by mouth every 7 (seven) days.     No current facility-administered medications for this visit.    Allergies   Allergies as of 08/08/2023 - Review Complete 08/08/2023  Allergen Reaction Noted   Alpha-gal Shortness Of Breath, Nausea And Vomiting, and Dermatitis 02/28/2022   Other Shortness Of Breath, Diarrhea, Nausea And Vomiting, and Nausea Only 03/02/2021   Reglan  [metoclopramide ]  08/07/2023   Tape Other (See Comments) 01/03/2017     Past Medical History   Past  Medical History:  Diagnosis Date   AICD (automatic cardioverter/defibrillator) present    Allergic reaction to alpha-gal    Allergy 03/05/2022   Anemia    Anxiety state 03/25/2016   Arthritis    Basal cell carcinoma of forehead    Brain aneurysm    CHF (congestive heart failure) (HCC)    Coronary artery disease    a. 03/11/16 PCI with DES-->Prox/Mid Cx;  b. 03/14/16 PCI with DES x2-->RCA, EF 30-35%.   Depression 06/09/2010   Encounter for general adult medical examination with abnormal findings 05/04/2022   Essential hypertension    Hx   GERD (gastroesophageal reflux disease)    HFrEF (heart failure with reduced ejection fraction) (HCC)    a. 10/2016 Echo: EF 35-40%, Gr1 DD, mild focal basal septal hypertrophy, basal inflat, mid  inflat, basal antlat AK. Mid infept/inf/antlat, apical lateral sev HK. Mod MR. mildly reduced RV fxn. Mild TR.   History of pneumonia    Hyperlipidemia    IBS (irritable bowel syndrome)    Ischemic cardiomyopathy    a. 10/2016 Echo: EF 35-40%, Gr1 DD.   Lung cancer [facility])    Mitral regurgitation    Neuromuscular disorder (HCC)    NSTEMI (non-ST elevated myocardial infarction) (HCC) 03/10/2016   Pneumonia 03/2016   Shingles    Squamous cell cancer of skin of nose    Thrombocytosis 03/26/2016   Tobacco abuse    Trichimoniasis    Wears dentures    Wears glasses     Past Surgical History   Past Surgical History:  Procedure Laterality Date   APPENDECTOMY     BICEPT TENODESIS Left 03/21/2023   Procedure: BICEPS TENODESIS;  Surgeon: Jasmine Mesi, MD;  Location: [facility] OR;  Service: Orthopedics;  Laterality: Left;   BIOPSY  09/20/2018   Procedure: BIOPSY;  Surgeon: Suzette Espy, MD;  [facility];  Service: Endoscopy;;  colon   BIOPSY  01/05/2021   Procedure: BIOPSY;  Surgeon: Janel Medford, MD;  Location: [facility] ENDOSCOPY;  Service: Endoscopy;;   BIOPSY  04/27/2022   Procedure: BIOPSY;  Surgeon: Vinetta Greening, DO;  Location: [facility];  Service: Endoscopy;;   BRONCHIAL BIOPSY  07/03/2023   Procedure: BRONCHIAL BIOPSIES;  Surgeon: Denson Flake, MD;  Location: [facility] ENDOSCOPY;  Service: Pulmonary;;   BRONCHIAL BRUSHINGS  07/03/2023   Procedure: BRONCHIAL BRUSHINGS;  Surgeon: Denson Flake, MD;  Location: [facility] ENDOSCOPY;  Service: Pulmonary;;   BRONCHIAL NEEDLE ASPIRATION BIOPSY  07/03/2023   Procedure: BRONCHIAL NEEDLE ASPIRATION BIOPSIES;  Surgeon: Denson Flake, MD;  Location: [facility] ENDOSCOPY;  Service: Pulmonary;;   BRONCHIAL WASHINGS  07/03/2023   Procedure: BRONCHIAL WASHINGS;  Surgeon: Denson Flake, MD;  Location: [facility] ENDOSCOPY;  Service: Pulmonary;;   CARDIAC CATHETERIZATION N/A 03/11/2016   Procedure: Left Heart Cath and Coronary Angiography;  Surgeon: Arleen Lacer, MD;  Location: [facility] INVASIVE CV LAB;  Service: Cardiovascular;  Laterality: N/A;   CARDIAC CATHETERIZATION N/A 03/11/2016   Procedure: Coronary Stent Intervention;  Surgeon: Arleen Lacer, MD;  Location: [facility] INVASIVE CV LAB;  Service: Cardiovascular;  Laterality: N/A;   CARDIAC CATHETERIZATION N/A 03/14/2016   Procedure: Coronary Stent Intervention;  Surgeon: Peter M Swaziland, MD;  Location: [facility] INVASIVE CV LAB;  Service: Cardiovascular;  Laterality: N/A;   CEREBRAL ANEURYSM REPAIR  04/2019   stent placed   CHOLECYSTECTOMY OPEN  1984   COLONOSCOPY WITH PROPOFOL  N/A 09/20/2018   Procedure: COLONOSCOPY WITH PROPOFOL ;  Surgeon: Suzette Espy, MD;  [facility];  Service: Endoscopy;  Laterality: N/A;  10:30am   CORONARY ANGIOPLASTY WITH STENT PLACEMENT  03/14/2016   ESOPHAGOGASTRODUODENOSCOPY (EGD) WITH PROPOFOL  N/A 09/20/2018   Procedure: ESOPHAGOGASTRODUODENOSCOPY (EGD) WITH PROPOFOL ;  Surgeon: Suzette Espy, MD;  [facility];  Service: Endoscopy;  Laterality: N/A;   ESOPHAGOGASTRODUODENOSCOPY (EGD) WITH PROPOFOL  N/A 01/05/2021   Procedure: ESOPHAGOGASTRODUODENOSCOPY (EGD) WITH PROPOFOL ;  Surgeon: Janel Medford, MD;   Location: [facility] ENDOSCOPY;  Service: Endoscopy;  Laterality: N/A;   ESOPHAGOGASTRODUODENOSCOPY (EGD) WITH PROPOFOL  N/A 04/27/2022   Procedure: ESOPHAGOGASTRODUODENOSCOPY (EGD) WITH PROPOFOL ;  Surgeon: Vinetta Greening, DO;  [facility];  Service: Endoscopy;  Laterality: N/A;  9:15am, asa 3/4, ASAP   ESOPHAGOGASTRODUODENOSCOPY (EGD) WITH PROPOFOL  N/A 01/04/2023   Procedure: ESOPHAGOGASTRODUODENOSCOPY (EGD) WITH  PROPOFOL ;  Surgeon: Suzette Espy, MD;  [facility];  Service: Endoscopy;  Laterality: N/A;  130pm, asa 3   FIDUCIAL MARKER PLACEMENT  07/03/2023   Procedure: FIDUCIAL MARKER PLACEMENT;  Surgeon: Denson Flake, MD;  Location: [facility] ENDOSCOPY;  Service: Pulmonary;;   FINGER ARTHROPLASTY Left 05/14/2013   Procedure: LEFT THUMB CARPAL METACARPAL ARTHROPLASTY;  Surgeon: Milagros Alf, MD;  Location: [facility];  Service: Orthopedics;  Laterality: Left;   ICD IMPLANT N/A 04/03/2020   Procedure: ICD IMPLANT;  Surgeon: Lei Pump, MD;  Location: [facility] INVASIVE CV LAB;  Service: Cardiovascular;  Laterality: N/A;   IR ANGIO INTRA EXTRACRAN SEL COM CAROTID INNOMINATE BILAT MOD SED  01/05/2017   IR ANGIO INTRA EXTRACRAN SEL COM CAROTID INNOMINATE BILAT MOD SED  03/19/2019   IR ANGIO INTRA EXTRACRAN SEL COM CAROTID INNOMINATE BILAT MOD SED  06/04/2020   IR ANGIO INTRA EXTRACRAN SEL INTERNAL CAROTID UNI L MOD SED  04/15/2019   IR ANGIO VERTEBRAL SEL VERTEBRAL BILAT MOD SED  01/05/2017   IR ANGIO VERTEBRAL SEL VERTEBRAL BILAT MOD SED  03/19/2019   IR ANGIO VERTEBRAL SEL VERTEBRAL UNI L MOD SED  06/04/2020   IR ANGIOGRAM FOLLOW UP STUDY  04/15/2019   IR RADIOLOGIST EVAL & MGMT  12/30/2016   IR TRANSCATH/EMBOLIZ  04/15/2019   IR US  GUIDE VASC ACCESS RIGHT  03/19/2019   IR US  GUIDE VASC ACCESS RIGHT  06/04/2020   JOINT REPLACEMENT     MALONEY DILATION N/A 09/20/2018   Procedure: Londa Rival DILATION;  Surgeon: Suzette Espy, MD;  [facility];   Service: Endoscopy;  Laterality: N/A;   MALONEY DILATION N/A 01/04/2023   Procedure: Londa Rival DILATION;  Surgeon: Suzette Espy, MD;  Location[facility];  Service: Endoscopy;  Laterality: N/A;   RADIOLOGY WITH ANESTHESIA N/A 04/15/2019   Procedure: Rich Champ;  Surgeon: Luellen Sages, MD;  Location: [facility] OR;  Service: Radiology;  Laterality: N/A;   REVERSE SHOULDER ARTHROPLASTY Left 03/21/2023   Procedure: LEFT REVERSE SHOULDER ARTHROPLASTY;  Surgeon: Jasmine Mesi, MD;  Location: [facility] OR;  Service: Orthopedics;  Laterality: Left;   RIGHT/LEFT HEART CATH AND CORONARY ANGIOGRAPHY N/A 08/19/2019   Procedure: RIGHT/LEFT HEART CATH AND CORONARY ANGIOGRAPHY;  Surgeon: Darlis Eisenmenger, MD;  Location: [facility] INVASIVE CV LAB;  Service: Cardiovascular;  Laterality: N/A;   TUBAL LIGATION  1987   VAGINAL HYSTERECTOMY  2009    Past Family History   Family History  Problem Relation Age of Onset   Stroke Mother    Hypertension Mother    Diabetes Mother    Heart attack Mother    Heart attack Father    Diabetes Father    Hypertension Father    CAD Father    Colon polyps Father 44       pre-cancerous    Stroke Father    Dementia Father    Hyperlipidemia Father    Arthritis Father    COPD Father        smoked and worked Archivist mills   Heart disease Father    Breast cancer Maternal Grandmother    Diabetes Maternal Grandmother    Cancer Maternal Grandfather        Tongue and esophageal   Cancer Paternal Grandmother    Anxiety disorder Daughter    Depression Daughter    Anxiety disorder Daughter    Heart failure Other    Colon cancer Neg Hx     Past  Social History   Social History   Socioeconomic History   Marital status: Married    Spouse name: Not on file   Number of children: Not on file   Years of education: Not on file   Highest education level: Associate degree: occupational, Scientist, product/process development, or vocational program  Occupational History   Occupation: CNA  Tobacco Use    Smoking status: Former    Current packs/day: 1.50    Average packs/day: 1.5 packs/day for 40.0 years (60.0 ttl pk-yrs)    Types: Cigarettes   Smokeless tobacco: Never   Tobacco comments:    Smokes off and on 06/14/23 Cala Castleman, CMA     No cigarettes since Thursday this week>> 07/06/2023  Vaping Use   Vaping status: Never Used  Substance and Sexual Activity   Alcohol use: Not Currently    Comment: occasionally   Drug use: Not Currently    Types: Marijuana   Sexual activity: Not Currently    Birth control/protection: Surgical    Comment: hyst  Other Topics Concern   Not on file  Social History Narrative   Lives with husband in [place] in a one story home with a basement.  Has 4 children.  Works as a Lawyer.  Education: CNA school.    Social Drivers of Corporate investment banker Strain: Low Risk  (04/20/2023)   Overall Financial Resource Strain (CARDIA)    Difficulty of Paying Living Expenses: Not hard at all  Food Insecurity: No Food Insecurity (07/26/2023)   Hunger Vital Sign    Worried About Running Out of Food in the Last Year: Never true    Ran Out of Food in the Last Year: Never true  Transportation Needs: No Transportation Needs (07/26/2023)   PRAPARE - Administrator, Civil Service (Medical): No    Lack of Transportation (Non-Medical): No  Physical Activity: Insufficiently Active (04/20/2023)   Exercise Vital Sign    Days of Exercise per Week: 3 days    Minutes of Exercise per Session: 30 min  Stress: Stress Concern Present (04/20/2023)   [employer] of Occupational Health - Occupational Stress Questionnaire    Feeling of Stress : Very much  Social Connections: Socially Isolated (04/20/2023)   Social Connection and Isolation Panel [NHANES]    Frequency of Communication with Friends and Family: Once a week    Frequency of Social Gatherings with Friends and Family: Once a week    Attends Religious Services: Never    Database administrator or  Organizations: No    Attends Engineer, structural: Not on file    Marital Status: Married  Catering manager Violence: Not At Risk (07/26/2023)   Humiliation, Afraid, Rape, and Kick questionnaire    Fear of Current or Ex-Partner: No    Emotionally Abused: No    Physically Abused: No    Sexually Abused: No    Review of Systems   General: Negative for  fever, chills, fatigue, weakness. See hpi ENT: Negative for hoarseness, difficulty swallowing , nasal congestion. CV: Negative for chest pain, angina, palpitations, dyspnea on exertion, peripheral edema.  Respiratory: Negative for dyspnea at rest, dyspnea on exertion, cough, sputum, wheezing.  GI: See history of present illness. GU:  Negative for dysuria, hematuria, urinary incontinence, urinary frequency, nocturnal urination.  Endo: Negative for unusual weight change.     Physical Exam   BP (!) 90/52 (BP Location: Right Arm, Patient Position: Sitting, Cuff Size: Normal)   Pulse 78  Temp 97.9 F (36.6 C) (Oral)   Ht 5' (1.524 m)   Wt 110 lb (49.9 kg)   SpO2 98%   BMI 21.48 kg/m    General: Well-nourished, well-developed in no acute distress.  Eyes: No icterus. Mouth: Oropharyngeal mucosa moist and pink   Lungs: Clear to auscultation bilaterally.  Heart: Regular rate and rhythm, no murmurs rubs or gallops.  Abdomen: Bowel sounds are normal, nontender, nondistended, no hepatosplenomegaly or masses,  no abdominal bruits or hernia , no rebound or guarding.  Rectal: not performed  Extremities: No lower extremity edema. No clubbing or deformities. Neuro: Alert and oriented x 4   Skin: Warm and dry, no jaundice.   Psych: Alert and cooperative, normal mood and affect.  Labs   Lab Results  Component Value Date   NA 138 07/31/2023   CL 102 07/31/2023   K 3.9 07/31/2023   CO2 29 07/31/2023   BUN 21 07/31/2023   CREATININE 1.48 (H) 07/31/2023   GFRNONAA 40 (L) 07/31/2023   CALCIUM  10.2 07/31/2023   ALBUMIN  4.4  07/31/2023   GLUCOSE 148 (H) 07/31/2023   Lab Results  Component Value Date   ALT 15 07/31/2023   AST 22 07/31/2023   ALKPHOS 40 07/31/2023   BILITOT 0.5 07/31/2023   Lab Results  Component Value Date   WBC 7.8 07/31/2023   HGB 12.0 07/31/2023   HCT 36.2 07/31/2023   MCV 79.4 (L) 07/31/2023   PLT 519 (H) 07/31/2023   Lab Results  Component Value Date   HGBA1C 6.2 (H) 04/26/2022   Lab Results  Component Value Date   IRON 54 04/26/2022   TIBC 322 04/26/2022   FERRITIN 97 04/26/2022   Lab Results  Component Value Date   VITAMINB12 373 04/26/2022   Lab Results  Component Value Date   FOLATE 13.4 04/26/2022    Imaging Studies   NM PET Image Initial (PI) Skull Base To Thigh Result Date: 08/07/2023 CLINICAL DATA:  Lung nodule evaluation EXAM: NUCLEAR MEDICINE PET SKULL BASE TO THIGH TECHNIQUE: 5.03 mCi F-18 FDG was injected intravenously. Full-ring PET imaging was performed from the skull base to thigh after the radiotracer. CT data was obtained and used for attenuation correction and anatomic localization. Fasting blood glucose: 103 mg/dl COMPARISON:  CT 40/98/1191 and older FINDINGS: Mediastinal blood pool activity: SUV max 3.0 Liver activity: SUV max 3.5 NECK: Visualized intracranial compartment has near symmetric distribution radiotracer. No specific abnormal uptake seen in the neck including along lymph node change of the posterior triangle, internal jugular or submandibular regions. Incidental CT findings: Visualized paranasal sinuses and mastoid air cells are clear. Grossly preserved parotid glands, submandibular glands and thyroid  gland. Prominent vascular calcifications seen along the carotid vasculature. Particularly at the carotid bulb regions. CHEST: There is a ill-defined area of increased radiotracer uptake at the left lung apex with maximum SUV value of 1.6. This corresponds to a subtle opacity on CT with a fiduciary marker. This ground-glass like area is similar  extent distribution to the previous CT scan mic set for the marker. No other areas of abnormal right tracer uptake seen in the thorax including along the lung parenchyma, hilum or mediastinum. No axillary uptake. Incidental CT findings: Small pericardial effusion. Left upper chest battery pack with leads along the right side of the heart. Scattered vascular calcifications are seen including along the coronary arteries. Streak artifact related to patient's left shoulder arthroplasty. No discrete nodal enlargement identified on the noncontrast, attenuation correction CT scan in  the axillary regions, hilum or mediastinum. No consolidation, pneumothorax or effusion. ABDOMEN/PELVIS: There is physiologic distribution radiotracer along the parenchymal organs, renal collecting systems, and bowel. Only exception is an area of asymmetric uptake along anorectal region with maximum SUV of 5.5. Please correlate with clinical findings. Incidental CT findings: Grossly preserved appearance to the parenchymal organs including liver, spleen, pancreas. Malrotated appearance of the kidneys without collecting system dilatation or stones along the course of the renal collecting systems. Contracted urinary bladder. Bilateral adrenal nodules are identified. Left-sided focus has Hounsfield unit of -3 and maximal dimension of 2.1 cm. Right-sided focus has maximal dimension 12 mm and Hounsfield units of -2. Both of these lesions are without significant abnormal uptake and be consistent with adenomas. Is also were described as such on MRI of 01/06/2023. Inferior old MR neck aneurysm also identified with maximal dimension 3.1 cm. Visualized bowel is nondilated. Scattered colonic stool. SKELETON: Scattered degenerative changes of the spine and pelvis. Additional areas degenerative changes elsewhere along joints including sternoclavicular joints. Left shoulder arthroplasty. No specific areas of aggressive abnormal radiotracer uptake. IMPRESSION:  Ground-glass like area at the left lung apex is again seen and similar to previous CT scan. There is now fiduciary marker in this location. No specific abnormal radiotracer uptake. Please correlate with results from prior intervention. Otherwise based on today's examination, simple follow up is recommended in 3 months with CT. Slight asymmetric area of uptake along the anorectal region. This could be a physiologic variant but please correlate with clinical findings to exclude lesion. Incidental findings identified including bilateral adrenal adenomas. 3.1 cm infrarenal abdominal aortic aneurysm. Recommend follow-up ultrasound every 3 years. (Ref.: J Vasc Surg. 2018; 67:2-77 and J Am Coll Radiol 2013;10(10):789-794.) Electronically Signed   By: Adrianna Horde M.D.   On: 08/07/2023 15:45   ECHOCARDIOGRAM COMPLETE Result Date: 08/03/2023    ECHOCARDIOGRAM REPORT   Patient Name:   Mackinley  J Dolce Date of Exam: 08/03/2023 Medical Rec #:  [id number]       Height:       60.0 in Accession #:    [id number]      Weight:       106.4 lb Date of Birth:  [birth year]      BSA:          1.427 m Patient Age:    62 years        BP:           103/74 mmHg Patient Gender: F               HR:           70 bpm. Exam Location:  Outpatient Procedure: 2D Echo, Strain Analysis, Color Doppler and Cardiac Doppler (Both            Spectral and Color Flow Doppler were utilized during procedure). Indications:    Congestive heart failure  History:        Patient has prior history of Echocardiogram examinations.                 Cardiomyopathy and CHF, Signs/Symptoms:Hypotension and Chest                 Pain; Risk Factors:Hypertension and Former Smoker.  Sonographer:    Willey Harrier Referring Phys: [id number] ALMA L DIAZ IMPRESSIONS  1. Left ventricular ejection fraction, by estimation, is 35 to 40%. The left ventricle has moderately decreased function. The left ventricle demonstrates global hypokinesis. Left ventricular diastolic parameters are  consistent with Grade I diastolic dysfunction (impaired relaxation). The average left ventricular global longitudinal strain is -12.3 %. The global longitudinal strain is abnormal.  2. Right ventricular systolic function is normal. The right ventricular size is normal. There is normal pulmonary artery systolic pressure. The estimated right ventricular systolic pressure is 23.8 mmHg.  3. Left atrial size was mildly dilated.  4. The mitral valve is normal in structure. Trivial mitral valve regurgitation. No evidence of mitral stenosis.  5. The aortic valve is tricuspid. There is mild calcification of the aortic valve. Aortic valve regurgitation is not visualized. No aortic stenosis is present.  6. The inferior vena cava is normal in size with greater than 50% respiratory variability, suggesting right atrial pressure of 3 mmHg.  7. A small pericardial effusion is present. FINDINGS  Left Ventricle: Left ventricular ejection fraction, by estimation, is 35 to 40%. The left ventricle has moderately decreased function. The left ventricle demonstrates global hypokinesis. The average left ventricular global longitudinal strain is -12.3 %. Strain was performed and the global longitudinal strain is abnormal. The left ventricular internal cavity size was normal in size. There is no left ventricular hypertrophy. Left ventricular diastolic parameters are consistent with Grade I diastolic dysfunction (impaired relaxation). Right Ventricle: The right ventricular size is normal. No increase in right ventricular wall thickness. Right ventricular systolic function is normal. There is normal pulmonary artery systolic pressure. The tricuspid regurgitant velocity is 2.28 m/s, and  with an assumed right atrial pressure of 3 mmHg, the estimated right ventricular systolic pressure is 23.8 mmHg. Left Atrium: Left atrial size was mildly dilated. Right Atrium: Right atrial size was normal in size. Pericardium: A small pericardial effusion is  present. Mitral Valve: The mitral valve is normal in structure. Trivial mitral valve regurgitation. No evidence of mitral valve stenosis. MV peak gradient, 3.0 mmHg. The mean mitral valve gradient is 1.0 mmHg. Tricuspid Valve: The tricuspid valve is normal in structure. Tricuspid valve regurgitation is mild. Aortic Valve: The aortic valve is tricuspid. There is mild calcification of the aortic valve. Aortic valve regurgitation is not visualized. No aortic stenosis is present. Aortic valve mean gradient measures 3.0 mmHg. Aortic valve peak gradient measures 4.8 mmHg. Aortic valve area, by VTI measures 2.22 cm. Pulmonic Valve: The pulmonic valve was normal in structure. Pulmonic valve regurgitation is trivial. Aorta: The aortic root is normal in size and structure. Venous: The inferior vena cava is normal in size with greater than 50% respiratory variability, suggesting right atrial pressure of 3 mmHg. IAS/Shunts: No atrial level shunt detected by color flow Doppler. Additional Comments: A device lead is visualized in the right ventricle.  LEFT VENTRICLE PLAX 2D LVIDd:         4.90 cm      Diastology LVIDs:         4.40 cm      LV e' medial:    5.00 cm/s LV PW:         1.00 cm      LV E/e' medial:  14.3 LV IVS:        0.90 cm      LV e' lateral:   6.09 cm/s LVOT diam:     2.00 cm      LV E/e' lateral: 11.7 LV SV:         52 LV SV Index:   36           2D Longitudinal Strain LVOT Area:     3.14 cm  2D Strain GLS Avg:     -12.3 %  LV Volumes (MOD) LV vol d, MOD A2C: 79.9 ml LV vol d, MOD A4C: 120.0 ml LV vol s, MOD A2C: 49.6 ml LV vol s, MOD A4C: 73.7 ml LV SV MOD A2C:     30.3 ml LV SV MOD A4C:     120.0 ml LV SV MOD BP:      40.5 ml RIGHT VENTRICLE             IVC RV Basal diam:  2.80 cm     IVC diam: 1.00 cm RV S prime:     10.10 cm/s TAPSE (M-mode): 1.6 cm LEFT ATRIUM             Index        RIGHT ATRIUM           Index LA diam:        3.00 cm 2.10 cm/m   RA Area:     12.40 cm LA Vol (A2C):   37.1 ml 25.99  ml/m  RA Volume:   27.40 ml  19.19 ml/m LA Vol (A4C):   30.5 ml 21.37 ml/m LA Biplane Vol: 34.2 ml 23.96 ml/m  AORTIC VALVE AV Area (Vmax):    2.12 cm AV Area (Vmean):   1.97 cm AV Area (VTI):     2.22 cm AV Vmax:           110.00 cm/s AV Vmean:          82.400 cm/s AV VTI:            0.234 m AV Peak Grad:      4.8 mmHg AV Mean Grad:      3.0 mmHg LVOT Vmax:         74.20 cm/s LVOT Vmean:        51.600 cm/s LVOT VTI:          0.165 m LVOT/AV VTI ratio: 0.71  AORTA Ao Root diam: 2.90 cm Ao Asc diam:  3.00 cm MITRAL VALVE               TRICUSPID VALVE MV Area (PHT): 4.57 cm    TR Peak grad:   20.8 mmHg MV Area VTI:   2.09 cm    TR Vmax:        228.00 cm/s MV Peak grad:  3.0 mmHg MV Mean grad:  1.0 mmHg    SHUNTS MV Vmax:       0.87 m/s    Systemic VTI:  0.16 m MV Vmean:      58.3 cm/s   Systemic Diam: 2.00 cm MV Decel Time: 166 msec MV E velocity: 71.40 cm/s MV A velocity: 94.40 cm/s MV E/A ratio:  0.76 Dalton McleanMD Electronically signed by Archer Bear Signature Date/Time: 08/03/2023/11:52:39 AM    Final    [facility] DEVICE CHECK Result Date: 07/17/2023 Normal in-clinic ICD check. Thresholds, sensing, and impedance WNL or stable for patient over time. No new episodes. Estimated longevity 25yr26mo. Pt enrolled in remote follow-up.   Assessment/Plan:   Nausea/vomiting: -chronic recurrent symptoms with extensive work up as previously outlined. Positive for Alpha Gal but practices food avoidance. GES normal. EGD completed. S/p cholecystectomy. Fasting am cortisol level normal. 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 Start at onset of nausea.   PET,  abnormal: -slight asymmetric area of uptake along anorectal region, consider rule out lesion -I have reached out to oncology regarding findings. She may require updated colonoscopy.  Further recommendations to follow.    Trudie Fuse. Harles Lied, MHS, PA-C [facility]

## 2023-08-07 NOTE — Progress Notes (Unsigned)
 GI Office Note    Referring Provider: Billie Lade, MD Primary Care Physician:  Billie Lade, MD  Primary Gastroenterologist: Roetta Sessions, MD   Chief Complaint   No chief complaint on file.   History of Present Illness   Erin [name] is a 63 y.o. female presenting today for follow up. Last seen 07/2023. H/o ewight loss, postprandial N/V, abd pain. Positive alpha gal. Reported multiple allergies based on testing. ACTH test previously unrevealing. Fasting AM cortisol previously low with subsequent assay last year withing normal limites. Recent diagnosis of non-small cell lung cancer.   Negative workup in the ED recently for due to "dehydration".  Started on Reglan 10 mg orally 4 times daily.  States its to help with the nausea somewhat but she is now having involuntary movements of her left upper extremity.   GES normal previously.  Fasting am cortisol 21.5. TTG IgA <2, IgA 176.   PET 07/2023 IMPRESSION: Ground-glass like area at the left lung apex is again seen and similar to previous CT scan. There is now fiduciary marker in this location. No specific abnormal radiotracer uptake. Please correlate with results from prior intervention. Otherwise based on today's examination, simple follow up is recommended in 3 months with CT.   Slight asymmetric area of uptake along the anorectal region. This could be a physiologic variant but please correlate with clinical findings to exclude lesion.   Incidental findings identified including bilateral adrenal adenomas.   3.1 cm infrarenal abdominal aortic aneurysm. Recommend follow-up ultrasound every 3 years. (Ref.: J Vasc Surg. 2018; 67:2-77 and J Am Coll Radiol 2013;10(10):789-794.)   EGD 12/2022: -normal esophagus s/p dilation  Colonoscopy 09/2018: -norml ileocolonoscopy -semgental bopsies negative.  -10 year follow up  Medications   Current Outpatient Medications  Medication Sig Dispense Refill   aspirin EC  81 MG tablet Take 1 tablet (81 mg total) by mouth every evening. Okay to restart on 2/29/2025     atorvastatin (LIPITOR) 80 MG tablet Take 1 tablet (80 mg total) by mouth daily. 90 tablet 3   clopidogrel (PLAVIX) 75 MG tablet Take 1 tablet (75 mg total) by mouth daily. Okay to restart on 07/04/2023     dapagliflozin propanediol (FARXIGA) 10 MG TABS tablet Take 1 tablet (10 mg total) by mouth daily before breakfast. 30 tablet 11   EPINEPHrine (EPIPEN 2-PAK) 0.3 mg/0.3 mL IJ SOAJ injection Inject 0.3 mg into the muscle as needed for anaphylaxis. 2 each 2   Evolocumab (REPATHA SURECLICK) 140 MG/ML SOAJ Inject 140 mg into the skin every 14 (fourteen) days. 6 mL 3   fenofibrate (TRICOR) 145 MG tablet Take 1 tablet (145 mg total) by mouth daily. 90 tablet 3   furosemide (LASIX) 20 MG tablet Take 2 tablets (40 mg total) by mouth 2 (two) times daily. 360 tablet 3   gabapentin (NEURONTIN) 300 MG capsule Take 1 capsule by mouth at bedtime 30 capsule 0   hydrOXYzine (VISTARIL) 25 MG capsule Take 1 capsule (25 mg total) by mouth every 8 (eight) hours as needed. 30 capsule 1   linaclotide (LINZESS) 145 MCG CAPS capsule Take 1 capsule (145 mcg total) by mouth daily before breakfast. 30 capsule 11   loperamide (IMODIUM A-D) 2 MG tablet Take 2 mg by mouth 4 (four) times daily as needed for diarrhea or loose stools.     losartan (COZAAR) 25 MG tablet Take 0.5 tablets (12.5 mg total) by mouth at bedtime.     MAGNESIUM-OXIDE  400 (240 Mg) MG tablet Take 1 tablet by mouth daily.     metoCLOPramide (REGLAN) 10 MG tablet Take 1 tablet (10 mg total) by mouth every 6 (six) hours. 30 tablet 0   metoprolol succinate (TOPROL XL) 25 MG 24 hr tablet Take 0.5 tablets (12.5 mg total) by mouth at bedtime.     Multiple Vitamins-Minerals (MULTIVITAMIN WITH MINERALS) tablet Take 1 tablet by mouth daily.     nitroGLYCERIN (NITROSTAT) 0.4 MG SL tablet Place 1 tablet (0.4 mg total) under the tongue every 5 (five) minutes x 3 doses as  needed for chest pain (if no relief after 2nd dose, proceed to the ED for an evaluation or call 911). 25 tablet 2   pantoprazole (PROTONIX) 40 MG tablet Take 40 mg by mouth 2 (two) times daily.     penicillin v potassium (VEETID) 500 MG tablet Take 1 tablet (500 mg total) by mouth 4 (four) times daily. (Patient not taking: Reported on 07/26/2023) 20 tablet 0   potassium chloride SA (KLOR-CON M) 20 MEQ tablet Take 2 tablets (40 mEq total) by mouth 2 (two) times daily. 120 tablet 6   rOPINIRole (REQUIP) 0.5 MG tablet Take 0.5 mg by mouth at bedtime.   0   sertraline (ZOLOFT) 50 MG tablet Take 1 tablet (50 mg total) by mouth daily. 30 tablet 3   spironolactone (ALDACTONE) 25 MG tablet Take 1 tablet (25 mg total) by mouth at bedtime. 30 tablet 3   traZODone (DESYREL) 50 MG tablet Take 1 tablet (50 mg total) by mouth at bedtime. 90 tablet 1   Vitamin D, Ergocalciferol, (DRISDOL) 1.25 MG (50000 UNIT) CAPS capsule Take 50,000 Units by mouth every 7 (seven) days.     No current facility-administered medications for this visit.    Allergies   Allergies as of 08/08/2023 - Review Complete 07/26/2023  Allergen Reaction Noted   Alpha-gal Shortness Of Breath, Nausea And Vomiting, and Dermatitis 02/28/2022   Other Shortness Of Breath, Diarrhea, Nausea And Vomiting, and Nausea Only 03/02/2021   Reglan [metoclopramide]  08/07/2023   Tape Other (See Comments) 01/03/2017     Past Medical History   Past Medical History:  Diagnosis Date   AICD (automatic cardioverter/defibrillator) present    Allergic reaction to alpha-gal    Allergy 03/05/2022   Anemia    Anxiety state 03/25/2016   Arthritis    Basal cell carcinoma of forehead    Brain aneurysm    CHF (congestive heart failure) (HCC)    Coronary artery disease    a. 03/11/16 PCI with DES-->Prox/Mid Cx;  b. 03/14/16 PCI with DES x2-->RCA, EF 30-35%.   Depression 06/09/2010   Encounter for general adult medical examination with abnormal findings  05/04/2022   Essential hypertension    Hx   GERD (gastroesophageal reflux disease)    HFrEF (heart failure with reduced ejection fraction) (HCC)    a. 10/2016 Echo: EF 35-40%, Gr1 DD, mild focal basal septal hypertrophy, basal inflat, mid inflat, basal antlat AK. Mid infept/inf/antlat, apical lateral sev HK. Mod MR. mildly reduced RV fxn. Mild TR.   History of pneumonia    Hyperlipidemia    IBS (irritable bowel syndrome)    Ischemic cardiomyopathy    a. 10/2016 Echo: EF 35-40%, Gr1 DD.   Lung cancer [facility])    Mitral regurgitation    Neuromuscular disorder (HCC)    NSTEMI (non-ST elevated myocardial infarction) (HCC) 03/10/2016   Pneumonia 03/2016   Shingles    Squamous cell cancer of  skin of nose    Thrombocytosis 03/26/2016   Tobacco abuse    Trichimoniasis    Wears dentures    Wears glasses     Past Surgical History   Past Surgical History:  Procedure Laterality Date   APPENDECTOMY     BICEPT TENODESIS Left 03/21/2023   Procedure: BICEPS TENODESIS;  Surgeon: Cammy Copa, MD;  Location: [facility] OR;  Service: Orthopedics;  Laterality: Left;   BIOPSY  09/20/2018   Procedure: BIOPSY;  Surgeon: Corbin Ade, MD;  [facility];  Service: Endoscopy;;  colon   BIOPSY  01/05/2021   Procedure: BIOPSY;  Surgeon: Rachael Fee, MD;  Location: [facility] ENDOSCOPY;  Service: Endoscopy;;   BIOPSY  04/27/2022   Procedure: BIOPSY;  Surgeon: Lanelle Bal, DO;  Location: [facility];  Service: Endoscopy;;   BRONCHIAL BIOPSY  07/03/2023   Procedure: BRONCHIAL BIOPSIES;  Surgeon: Leslye Peer, MD;  Location: [facility] ENDOSCOPY;  Service: Pulmonary;;   BRONCHIAL BRUSHINGS  07/03/2023   Procedure: BRONCHIAL BRUSHINGS;  Surgeon: Leslye Peer, MD;  Location: [facility] ENDOSCOPY;  Service: Pulmonary;;   BRONCHIAL NEEDLE ASPIRATION BIOPSY  07/03/2023   Procedure: BRONCHIAL NEEDLE ASPIRATION BIOPSIES;  Surgeon: Leslye Peer, MD;  Location: [facility] ENDOSCOPY;  Service: Pulmonary;;    BRONCHIAL WASHINGS  07/03/2023   Procedure: BRONCHIAL WASHINGS;  Surgeon: Leslye Peer, MD;  Location: [facility] ENDOSCOPY;  Service: Pulmonary;;   CARDIAC CATHETERIZATION N/A 03/11/2016   Procedure: Left Heart Cath and Coronary Angiography;  Surgeon: Marykay Lex, MD;  Location: [facility] INVASIVE CV LAB;  Service: Cardiovascular;  Laterality: N/A;   CARDIAC CATHETERIZATION N/A 03/11/2016   Procedure: Coronary Stent Intervention;  Surgeon: Marykay Lex, MD;  Location: [facility] INVASIVE CV LAB;  Service: Cardiovascular;  Laterality: N/A;   CARDIAC CATHETERIZATION N/A 03/14/2016   Procedure: Coronary Stent Intervention;  Surgeon: Peter M Swaziland, MD;  Location: [facility] INVASIVE CV LAB;  Service: Cardiovascular;  Laterality: N/A;   CEREBRAL ANEURYSM REPAIR  04/2019   stent placed   CHOLECYSTECTOMY OPEN  1984   COLONOSCOPY WITH PROPOFOL N/A 09/20/2018   Procedure: COLONOSCOPY WITH PROPOFOL;  Surgeon: Corbin Ade, MD;  [facility];  Service: Endoscopy;  Laterality: N/A;  10:30am   CORONARY ANGIOPLASTY WITH STENT PLACEMENT  03/14/2016   ESOPHAGOGASTRODUODENOSCOPY (EGD) WITH PROPOFOL N/A 09/20/2018   Procedure: ESOPHAGOGASTRODUODENOSCOPY (EGD) WITH PROPOFOL;  Surgeon: Corbin Ade, MD;  [facility];  Service: Endoscopy;  Laterality: N/A;   ESOPHAGOGASTRODUODENOSCOPY (EGD) WITH PROPOFOL N/A 01/05/2021   Procedure: ESOPHAGOGASTRODUODENOSCOPY (EGD) WITH PROPOFOL;  Surgeon: Rachael Fee, MD;  Location: [facility] ENDOSCOPY;  Service: Endoscopy;  Laterality: N/A;   ESOPHAGOGASTRODUODENOSCOPY (EGD) WITH PROPOFOL N/A 04/27/2022   Procedure: ESOPHAGOGASTRODUODENOSCOPY (EGD) WITH PROPOFOL;  Surgeon: Lanelle Bal, DO;  [facility];  Service: Endoscopy;  Laterality: N/A;  9:15am, asa 3/4, ASAP   ESOPHAGOGASTRODUODENOSCOPY (EGD) WITH PROPOFOL N/A 01/04/2023   Procedure: ESOPHAGOGASTRODUODENOSCOPY (EGD) WITH PROPOFOL;  Surgeon: Corbin Ade, MD;  [facility];  Service:  Endoscopy;  Laterality: N/A;  130pm, asa 3   FIDUCIAL MARKER PLACEMENT  07/03/2023   Procedure: FIDUCIAL MARKER PLACEMENT;  Surgeon: Leslye Peer, MD;  Location: [facility] ENDOSCOPY;  Service: Pulmonary;;   FINGER ARTHROPLASTY Left 05/14/2013   Procedure: LEFT THUMB CARPAL METACARPAL ARTHROPLASTY;  Surgeon: Tami Ribas, MD;  Location: [facility];  Service: Orthopedics;  Laterality: Left;   ICD IMPLANT N/A 04/03/2020   Procedure:  ICD IMPLANT;  Surgeon: Regan Lemming, MD;  Location: [facility] INVASIVE CV LAB;  Service: Cardiovascular;  Laterality: N/A;   IR ANGIO INTRA EXTRACRAN SEL COM CAROTID INNOMINATE BILAT MOD SED  01/05/2017   IR ANGIO INTRA EXTRACRAN SEL COM CAROTID INNOMINATE BILAT MOD SED  03/19/2019   IR ANGIO INTRA EXTRACRAN SEL COM CAROTID INNOMINATE BILAT MOD SED  06/04/2020   IR ANGIO INTRA EXTRACRAN SEL INTERNAL CAROTID UNI L MOD SED  04/15/2019   IR ANGIO VERTEBRAL SEL VERTEBRAL BILAT MOD SED  01/05/2017   IR ANGIO VERTEBRAL SEL VERTEBRAL BILAT MOD SED  03/19/2019   IR ANGIO VERTEBRAL SEL VERTEBRAL UNI L MOD SED  06/04/2020   IR ANGIOGRAM FOLLOW UP STUDY  04/15/2019   IR RADIOLOGIST EVAL & MGMT  12/30/2016   IR TRANSCATH/EMBOLIZ  04/15/2019   IR US GUIDE VASC ACCESS RIGHT  03/19/2019   IR US GUIDE VASC ACCESS RIGHT  06/04/2020   JOINT REPLACEMENT     MALONEY DILATION N/A 09/20/2018   Procedure: Elease Hashimoto DILATION;  Surgeon: Corbin Ade, MD;  [facility];  Service: Endoscopy;  Laterality: N/A;   MALONEY DILATION N/A 01/04/2023   Procedure: Elease Hashimoto DILATION;  Surgeon: Corbin Ade, MD;  Location[facility];  Service: Endoscopy;  Laterality: N/A;   RADIOLOGY WITH ANESTHESIA N/A 04/15/2019   Procedure: Sharman Crate;  Surgeon: Julieanne Cotton, MD;  Location: [facility] OR;  Service: Radiology;  Laterality: N/A;   REVERSE SHOULDER ARTHROPLASTY Left 03/21/2023   Procedure: LEFT REVERSE SHOULDER ARTHROPLASTY;  Surgeon: Cammy Copa, MD;  Location:  [facility] OR;  Service: Orthopedics;  Laterality: Left;   RIGHT/LEFT HEART CATH AND CORONARY ANGIOGRAPHY N/A 08/19/2019   Procedure: RIGHT/LEFT HEART CATH AND CORONARY ANGIOGRAPHY;  Surgeon: Laurey Morale, MD;  Location: [facility] INVASIVE CV LAB;  Service: Cardiovascular;  Laterality: N/A;   TUBAL LIGATION  1987   VAGINAL HYSTERECTOMY  2009    Past Family History   Family History  Problem Relation Age of Onset   Stroke Mother    Hypertension Mother    Diabetes Mother    Heart attack Mother    Heart attack Father    Diabetes Father    Hypertension Father    CAD Father    Colon polyps Father 75       pre-cancerous    Stroke Father    Dementia Father    Hyperlipidemia Father    Arthritis Father    COPD Father        smoked and worked Archivist mills   Heart disease Father    Breast cancer Maternal Grandmother    Diabetes Maternal Grandmother    Cancer Maternal Grandfather        Tongue and esophageal   Cancer Paternal Grandmother    Anxiety disorder Daughter    Depression Daughter    Anxiety disorder Daughter    Heart failure Other    Colon cancer Neg Hx     Past Social History   Social History   Socioeconomic History   Marital status: Married    Spouse name: Not on file   Number of children: Not on file   Years of education: Not on file   Highest education level: Associate degree: occupational, Scientist, product/process development, or vocational program  Occupational History   Occupation: CNA  Tobacco Use   Smoking status: Former    Current packs/day: 1.50    Average packs/day: 1.5 packs/day for 40.0 years (60.0 ttl pk-yrs)    Types:  Cigarettes   Smokeless tobacco: Never   Tobacco comments:    Smokes off and on 06/14/23 [name], CMA     No cigarettes since Thursday this week>> 07/06/2023  Vaping Use   Vaping status: Never Used  Substance and Sexual Activity   Alcohol use: Not Currently    Comment: occasionally   Drug use: Not Currently    Types: Marijuana   Sexual activity: Not  Currently    Birth control/protection: Surgical    Comment: hyst  Other Topics Concern   Not on file  Social History Narrative   Lives with husband in [place] in a one story home with a basement.  Has 4 children.  Works as a Lawyer.  Education: CNA school.    Social Drivers of Corporate investment banker Strain: Low Risk  (04/20/2023)   Overall Financial Resource Strain (CARDIA)    Difficulty of Paying Living Expenses: Not hard at all  Food Insecurity: No Food Insecurity (07/26/2023)   Hunger Vital Sign    Worried About Running Out of Food in the Last Year: Never true    Ran Out of Food in the Last Year: Never true  Transportation Needs: No Transportation Needs (07/26/2023)   PRAPARE - Administrator, Civil Service (Medical): No    Lack of Transportation (Non-Medical): No  Physical Activity: Insufficiently Active (04/20/2023)   Exercise Vital Sign    Days of Exercise per Week: 3 days    Minutes of Exercise per Session: 30 min  Stress: Stress Concern Present (04/20/2023)   [employer] of Occupational Health - Occupational Stress Questionnaire    Feeling of Stress : Very much  Social Connections: Socially Isolated (04/20/2023)   Social Connection and Isolation Panel [NHANES]    Frequency of Communication with Friends and Family: Once a week    Frequency of Social Gatherings with Friends and Family: Once a week    Attends Religious Services: Never    Database administrator or Organizations: No    Attends Engineer, structural: Not on file    Marital Status: Married  Catering manager Violence: Not At Risk (07/26/2023)   Humiliation, Afraid, Rape, and Kick questionnaire    Fear of Current or Ex-Partner: No    Emotionally Abused: No    Physically Abused: No    Sexually Abused: No    Review of Systems   General: Negative for anorexia, weight loss, fever, chills, fatigue, weakness. ENT: Negative for hoarseness, difficulty swallowing , nasal congestion. CV:  Negative for chest pain, angina, palpitations, dyspnea on exertion, peripheral edema.  Respiratory: Negative for dyspnea at rest, dyspnea on exertion, cough, sputum, wheezing.  GI: See history of present illness. GU:  Negative for dysuria, hematuria, urinary incontinence, urinary frequency, nocturnal urination.  Endo: Negative for unusual weight change.     Physical Exam   There were no vitals taken for this visit.   General: Well-nourished, well-developed in no acute distress.  Eyes: No icterus. Mouth: Oropharyngeal mucosa moist and pink , no lesions erythema or exudate. Lungs: Clear to auscultation bilaterally.  Heart: Regular rate and rhythm, no murmurs rubs or gallops.  Abdomen: Bowel sounds are normal, nontender, nondistended, no hepatosplenomegaly or masses,  no abdominal bruits or hernia , no rebound or guarding.  Rectal: ***  Extremities: No lower extremity edema. No clubbing or deformities. Neuro: Alert and oriented x 4   Skin: Warm and dry, no jaundice.   Psych: Alert and cooperative, normal mood and  affect.  Labs   Lab Results  Component Value Date   NA 138 07/31/2023   CL 102 07/31/2023   K 3.9 07/31/2023   CO2 29 07/31/2023   BUN 21 07/31/2023   CREATININE 1.48 (H) 07/31/2023   GFRNONAA 40 (L) 07/31/2023   CALCIUM 10.2 07/31/2023   ALBUMIN 4.4 07/31/2023   GLUCOSE 148 (H) 07/31/2023   Lab Results  Component Value Date   ALT 15 07/31/2023   AST 22 07/31/2023   ALKPHOS 40 07/31/2023   BILITOT 0.5 07/31/2023   Lab Results  Component Value Date   WBC 7.8 07/31/2023   HGB 12.0 07/31/2023   HCT 36.2 07/31/2023   MCV 79.4 (L) 07/31/2023   PLT 519 (H) 07/31/2023   Lab Results  Component Value Date   HGBA1C 6.2 (H) 04/26/2022   Lab Results  Component Value Date   IRON 54 04/26/2022   TIBC 322 04/26/2022   FERRITIN 97 04/26/2022   Lab Results  Component Value Date   VITAMINB12 373 04/26/2022   Lab Results  Component Value Date   FOLATE 13.4  04/26/2022    Imaging Studies   NM PET Image Initial (PI) Skull Base To Thigh Result Date: 08/07/2023 CLINICAL DATA:  Lung nodule evaluation EXAM: NUCLEAR MEDICINE PET SKULL BASE TO THIGH TECHNIQUE: 5.03 mCi F-18 FDG was injected intravenously. Full-ring PET imaging was performed from the skull base to thigh after the radiotracer. CT data was obtained and used for attenuation correction and anatomic localization. Fasting blood glucose: 103 mg/dl COMPARISON:  CT 16/02/9603 and older FINDINGS: Mediastinal blood pool activity: SUV max 3.0 Liver activity: SUV max 3.5 NECK: Visualized intracranial compartment has near symmetric distribution radiotracer. No specific abnormal uptake seen in the neck including along lymph node change of the posterior triangle, internal jugular or submandibular regions. Incidental CT findings: Visualized paranasal sinuses and mastoid air cells are clear. Grossly preserved parotid glands, submandibular glands and thyroid gland. Prominent vascular calcifications seen along the carotid vasculature. Particularly at the carotid bulb regions. CHEST: There is a ill-defined area of increased radiotracer uptake at the left lung apex with maximum SUV value of 1.6. This corresponds to a subtle opacity on CT with a fiduciary marker. This ground-glass like area is similar extent distribution to the previous CT scan mic set for the marker. No other areas of abnormal right tracer uptake seen in the thorax including along the lung parenchyma, hilum or mediastinum. No axillary uptake. Incidental CT findings: Small pericardial effusion. Left upper chest battery pack with leads along the right side of the heart. Scattered vascular calcifications are seen including along the coronary arteries. Streak artifact related to patient's left shoulder arthroplasty. No discrete nodal enlargement identified on the noncontrast, attenuation correction CT scan in the axillary regions, hilum or mediastinum. No  consolidation, pneumothorax or effusion. ABDOMEN/PELVIS: There is physiologic distribution radiotracer along the parenchymal organs, renal collecting systems, and bowel. Only exception is an area of asymmetric uptake along anorectal region with maximum SUV of 5.5. Please correlate with clinical findings. Incidental CT findings: Grossly preserved appearance to the parenchymal organs including liver, spleen, pancreas. Malrotated appearance of the kidneys without collecting system dilatation or stones along the course of the renal collecting systems. Contracted urinary bladder. Bilateral adrenal nodules are identified. Left-sided focus has Hounsfield unit of -3 and maximal dimension of 2.1 cm. Right-sided focus has maximal dimension 12 mm and Hounsfield units of -2. Both of these lesions are without significant abnormal uptake and be consistent with  adenomas. Is also were described as such on MRI of 01/06/2023. Inferior old MR neck aneurysm also identified with maximal dimension 3.1 cm. Visualized bowel is nondilated. Scattered colonic stool. SKELETON: Scattered degenerative changes of the spine and pelvis. Additional areas degenerative changes elsewhere along joints including sternoclavicular joints. Left shoulder arthroplasty. No specific areas of aggressive abnormal radiotracer uptake. IMPRESSION: Ground-glass like area at the left lung apex is again seen and similar to previous CT scan. There is now fiduciary marker in this location. No specific abnormal radiotracer uptake. Please correlate with results from prior intervention. Otherwise based on today's examination, simple follow up is recommended in 3 months with CT. Slight asymmetric area of uptake along the anorectal region. This could be a physiologic variant but please correlate with clinical findings to exclude lesion. Incidental findings identified including bilateral adrenal adenomas. 3.1 cm infrarenal abdominal aortic aneurysm. Recommend follow-up  ultrasound every 3 years. (Ref.: J Vasc Surg. 2018; 67:2-77 and J Am Coll Radiol 2013;10(10):789-794.) Electronically Signed   By: Karen Kays M.D.   On: 08/07/2023 15:45   ECHOCARDIOGRAM COMPLETE Result Date: 08/03/2023    ECHOCARDIOGRAM REPORT   Patient Name:   Erin [name] Swords Date of Exam: 08/03/2023 Medical Rec #:  [id number]       Height:       60.0 in Accession #:    [id number]      Weight:       106.4 lb Date of Birth:  04-Jul-1960      BSA:          1.427 m Patient Age:    62 years        BP:           103/74 mmHg Patient Gender: F               HR:           70 bpm. Exam Location:  Outpatient Procedure: 2D Echo, Strain Analysis, Color Doppler and Cardiac Doppler (Both            Spectral and Color Flow Doppler were utilized during procedure). Indications:    Congestive heart failure  History:        Patient has prior history of Echocardiogram examinations.                 Cardiomyopathy and CHF, Signs/Symptoms:Hypotension and Chest                 Pain; Risk Factors:Hypertension and Former Smoker.  Sonographer:    Lamont Snowball Referring Phys: [phone] ALMA L DIAZ IMPRESSIONS  1. Left ventricular ejection fraction, by estimation, is 35 to 40%. The left ventricle has moderately decreased function. The left ventricle demonstrates global hypokinesis. Left ventricular diastolic parameters are consistent with Grade I diastolic dysfunction (impaired relaxation). The average left ventricular global longitudinal strain is -12.3 %. The global longitudinal strain is abnormal.  2. Right ventricular systolic function is normal. The right ventricular size is normal. There is normal pulmonary artery systolic pressure. The estimated right ventricular systolic pressure is 23.8 mmHg.  3. Left atrial size was mildly dilated.  4. The mitral valve is normal in structure. Trivial mitral valve regurgitation. No evidence of mitral stenosis.  5. The aortic valve is tricuspid. There is mild calcification of the aortic valve.  Aortic valve regurgitation is not visualized. No aortic stenosis is present.  6. The inferior vena cava is normal in size with greater than 50% respiratory variability, suggesting right atrial pressure  of 3 mmHg.  7. A small pericardial effusion is present. FINDINGS  Left Ventricle: Left ventricular ejection fraction, by estimation, is 35 to 40%. The left ventricle has moderately decreased function. The left ventricle demonstrates global hypokinesis. The average left ventricular global longitudinal strain is -12.3 %. Strain was performed and the global longitudinal strain is abnormal. The left ventricular internal cavity size was normal in size. There is no left ventricular hypertrophy. Left ventricular diastolic parameters are consistent with Grade I diastolic dysfunction (impaired relaxation). Right Ventricle: The right ventricular size is normal. No increase in right ventricular wall thickness. Right ventricular systolic function is normal. There is normal pulmonary artery systolic pressure. The tricuspid regurgitant velocity is 2.28 m/s, and  with an assumed right atrial pressure of 3 mmHg, the estimated right ventricular systolic pressure is 23.8 mmHg. Left Atrium: Left atrial size was mildly dilated. Right Atrium: Right atrial size was normal in size. Pericardium: A small pericardial effusion is present. Mitral Valve: The mitral valve is normal in structure. Trivial mitral valve regurgitation. No evidence of mitral valve stenosis. MV peak gradient, 3.0 mmHg. The mean mitral valve gradient is 1.0 mmHg. Tricuspid Valve: The tricuspid valve is normal in structure. Tricuspid valve regurgitation is mild. Aortic Valve: The aortic valve is tricuspid. There is mild calcification of the aortic valve. Aortic valve regurgitation is not visualized. No aortic stenosis is present. Aortic valve mean gradient measures 3.0 mmHg. Aortic valve peak gradient measures 4.8 mmHg. Aortic valve area, by VTI measures 2.22 cm. Pulmonic  Valve: The pulmonic valve was normal in structure. Pulmonic valve regurgitation is trivial. Aorta: The aortic root is normal in size and structure. Venous: The inferior vena cava is normal in size with greater than 50% respiratory variability, suggesting right atrial pressure of 3 mmHg. IAS/Shunts: No atrial level shunt detected by color flow Doppler. Additional Comments: A device lead is visualized in the right ventricle.  LEFT VENTRICLE PLAX 2D LVIDd:         4.90 cm      Diastology LVIDs:         4.40 cm      LV e' medial:    5.00 cm/s LV PW:         1.00 cm      LV E/e' medial:  14.3 LV IVS:        0.90 cm      LV e' lateral:   6.09 cm/s LVOT diam:     2.00 cm      LV E/e' lateral: 11.7 LV SV:         52 LV SV Index:   36           2D Longitudinal Strain LVOT Area:     3.14 cm     2D Strain GLS Avg:     -12.3 %  LV Volumes (MOD) LV vol d, MOD A2C: 79.9 ml LV vol d, MOD A4C: 120.0 ml LV vol s, MOD A2C: 49.6 ml LV vol s, MOD A4C: 73.7 ml LV SV MOD A2C:     30.3 ml LV SV MOD A4C:     120.0 ml LV SV MOD BP:      40.5 ml RIGHT VENTRICLE             IVC RV Basal diam:  2.80 cm     IVC diam: 1.00 cm RV S prime:     10.10 cm/s TAPSE (M-mode): 1.6 cm LEFT ATRIUM  Index        RIGHT ATRIUM           Index LA diam:        3.00 cm 2.10 cm/m   RA Area:     12.40 cm LA Vol (A2C):   37.1 ml 25.99 ml/m  RA Volume:   27.40 ml  19.19 ml/m LA Vol (A4C):   30.5 ml 21.37 ml/m LA Biplane Vol: 34.2 ml 23.96 ml/m  AORTIC VALVE AV Area (Vmax):    2.12 cm AV Area (Vmean):   1.97 cm AV Area (VTI):     2.22 cm AV Vmax:           110.00 cm/s AV Vmean:          82.400 cm/s AV VTI:            0.234 m AV Peak Grad:      4.8 mmHg AV Mean Grad:      3.0 mmHg LVOT Vmax:         74.20 cm/s LVOT Vmean:        51.600 cm/s LVOT VTI:          0.165 m LVOT/AV VTI ratio: 0.71  AORTA Ao Root diam: 2.90 cm Ao Asc diam:  3.00 cm MITRAL VALVE               TRICUSPID VALVE MV Area (PHT): 4.57 cm    TR Peak grad:   20.8 mmHg MV Area VTI:    2.09 cm    TR Vmax:        228.00 cm/s MV Peak grad:  3.0 mmHg MV Mean grad:  1.0 mmHg    SHUNTS MV Vmax:       0.87 m/s    Systemic VTI:  0.16 m MV Vmean:      58.3 cm/s   Systemic Diam: 2.00 cm MV Decel Time: 166 msec MV E velocity: 71.40 cm/s MV A velocity: 94.40 cm/s MV E/A ratio:  0.76 Dalton McleanMD Electronically signed by Wilfred Lacy Signature Date/Time: 08/03/2023/11:52:39 AM    Final    [facility] INCL[facility]IC DEVICE CHECK Result Date: 07/17/2023 Normal in-clinic ICD check. Thresholds, sensing, and impedance WNL or stable for patient over time. No new episodes. Estimated longevity 68yr11mo. Pt enrolled in remote follow-up.   Assessment       PLAN   ***   Leanna Battles. Melvyn Neth, MHS, PA-C [facility]

## 2023-08-08 ENCOUNTER — Encounter: Payer: Self-pay | Admitting: Gastroenterology

## 2023-08-08 ENCOUNTER — Telehealth: Payer: Self-pay | Admitting: Gastroenterology

## 2023-08-08 ENCOUNTER — Ambulatory Visit (INDEPENDENT_AMBULATORY_CARE_PROVIDER_SITE_OTHER): Admitting: Gastroenterology

## 2023-08-08 VITALS — BP 90/52 | HR 78 | Temp 97.9°F | Ht 60.0 in | Wt 110.0 lb

## 2023-08-08 DIAGNOSIS — R948 Abnormal results of function studies of other organs and systems: Secondary | ICD-10-CM | POA: Diagnosis not present

## 2023-08-08 DIAGNOSIS — Z91018 Allergy to other foods: Secondary | ICD-10-CM | POA: Diagnosis not present

## 2023-08-08 DIAGNOSIS — R112 Nausea with vomiting, unspecified: Secondary | ICD-10-CM | POA: Insufficient documentation

## 2023-08-08 MED ORDER — ONDANSETRON HCL 8 MG PO TABS: 8.0000 mg | ORAL_TABLET | Freq: Three times a day (TID) | ORAL | 3 refills | Status: AC | PRN

## 2023-08-08 NOTE — Patient Instructions (Signed)
 You can take Zofran 4-8mg  every 8 hours as needed. I sent in RX for 8mg  tablets, if needed you can always break in half. I would suggest you start out with 4mg  at onset of nausea spells. If needed, 8mg  can be taken at a time, no more than 8mg  every 8 hours. Hopefully this will shorten your episodes and decrease vomiting.  I will reach out to Dr. Gwenyth Bouillon regarding PET findings and possible need for colonoscopy. Further recommendations to follow.

## 2023-08-08 NOTE — Telephone Encounter (Signed)
 Please let pt know I discussed with her oncologist, he is advising colonoscopy given recent PET scan findings in the anorectal area.   Please arrange for colonoscopy with Dr. Jena Gauss. If she needs done sooner than what schedule allows, can we see if she is ok with Dr. Marletta Lor.   ASA 3.  She has defibrillator  Trilyte or similar due to elevated creatinine Hold farxiga 72 hours.  If tcs by anyone other than rourk, will need to hold Plavix for 5 days

## 2023-08-09 ENCOUNTER — Ambulatory Visit (HOSPITAL_COMMUNITY)
Admission: RE | Admit: 2023-08-09 | Discharge: 2023-08-09 | Disposition: A | Discharge status: Home / Self Care | Source: Ambulatory Visit | Attending: Internal Medicine | Admitting: Internal Medicine

## 2023-08-09 DIAGNOSIS — C3412 Malignant neoplasm of upper lobe, left bronchus or lung: Secondary | ICD-10-CM | POA: Diagnosis present

## 2023-08-09 LAB — PULMONARY FUNCTION TEST
DL/VA % pred: 66 %
DL/VA: 2.91 ml/min/mmHg/L
DLCO cor % pred: 58 %
DLCO cor: 10.3 ml/min/mmHg
DLCO unc % pred: 56 %
DLCO unc: 9.83 ml/min/mmHg
FEF 25-75 Post: 1.47 L/s
FEF 25-75 Pre: 0.79 L/s
FEF2575-%Change-Post: 87 %
FEF2575-%Pred-Post: 70 %
FEF2575-%Pred-Pre: 37 %
FEV1-%Change-Post: 17 %
FEV1-%Pred-Post: 83 %
FEV1-%Pred-Pre: 71 %
FEV1-Post: 1.81 L
FEV1-Pre: 1.54 L
FEV1FVC-%Change-Post: 15 %
FEV1FVC-%Pred-Pre: 83 %
FEV6-%Change-Post: 5 %
FEV6-%Pred-Post: 90 %
FEV6-%Pred-Pre: 85 %
FEV6-Post: 2.43 L
FEV6-Pre: 2.3 L
FEV6FVC-%Change-Post: 3 %
FEV6FVC-%Pred-Post: 103 %
FEV6FVC-%Pred-Pre: 100 %
FVC-%Change-Post: 2 %
FVC-%Pred-Post: 86 %
FVC-%Pred-Pre: 84 %
FVC-Post: 2.43 L
FVC-Pre: 2.37 L
Post FEV1/FVC ratio: 75 %
Post FEV6/FVC ratio: 100 %
Pre FEV1/FVC ratio: 65 %
Pre FEV6/FVC Ratio: 97 %
RV % pred: 108 %
RV: 1.96 L
TLC % pred: 95 %
TLC: 4.26 L

## 2023-08-09 MED ORDER — ALBUTEROL SULFATE (2.5 MG/3ML) 0.083% IN NEBU
2.5000 mg | INHALATION_SOLUTION | Freq: Once | RESPIRATORY_TRACT | Status: AC
  Administered 2023-08-09: 2.5 mg via RESPIRATORY_TRACT

## 2023-08-09 NOTE — Telephone Encounter (Signed)
 LMOVM to call back

## 2023-08-10 NOTE — Progress Notes (Unsigned)
 [address]       [place] [address]             [phone]                    [name] Medical Record #[id number] Date of Birth: February 23, 1961  Referring: Si Gaul, MD Primary Care: Billie Lade, MD Primary Cardiologist: Marca Ancona, MD  Chief Complaint:   No chief complaint on file.   History of Present Illness:    Erin [name] 63 y.o. female presents for surgical evaluation of a ***   [name] is a 63 y.o. female.   Discussed the use of AI scribe software for clinical note transcription with the patient, who gave verbal consent to proceed.   History of Present Illness   Erin [name] is a 63 year old female with lung cancer who presents for evaluation of her condition.   She was diagnosed with lung cancer following an incidental finding of a lung nodule during an MRI for stomach issues in 2021. The nodule was monitored until late 2024 when it began to grow. A bronchoscopy on July 03, 2023, confirmed adenocarcinoma of the lung. A recent PET scan did not show concerning findings, and the area of concern did not appear prominently due to its ground glass opacity nature. No chest pain or breathing issues are reported currently.   She has experienced unintentional weight loss, dropping from 133 pounds to 106 pounds, attributed to a lack of appetite despite attempts to snack and consume nutritional supplements like Coronation instant breakfast. No nausea, vomiting, diarrhea, headaches, or changes in vision.   She underwent left shoulder surgery on March 21, 2023, with no complications from the procedure.   Her past medical history is significant for congestive heart failure, with an ejection fraction previously recorded at 35%, a heart attack in 2017, a brain aneurysm, basal cell carcinoma, squamous cell carcinoma, depression, acid reflux, high cholesterol, and surgical history including appendectomy,  cholecystectomy, and hysterectomy. She has an AICD in place due to her cardiac condition.   Smoking Hx: ***      Past Medical History:  Diagnosis Date   AICD (automatic cardioverter/defibrillator) present    Allergic reaction to alpha-gal    Allergy 03/05/2022   Anemia    Anxiety state 03/25/2016   Arthritis    Basal cell carcinoma of forehead    Brain aneurysm    CHF (congestive heart failure) (HCC)    Coronary artery disease    a. 03/11/16 PCI with DES-->Prox/Mid Cx;  b. 03/14/16 PCI with DES x2-->RCA, EF 30-35%.   Depression 06/09/2010   Encounter for general adult medical examination with abnormal findings 05/04/2022   Essential hypertension    Hx   GERD (gastroesophageal reflux disease)    HFrEF (heart failure with reduced ejection fraction) (HCC)    a. 10/2016 Echo: EF 35-40%, Gr1 DD, mild focal basal septal hypertrophy, basal inflat, mid inflat, basal antlat AK. Mid infept/inf/antlat, apical lateral sev HK. Mod MR. mildly reduced RV fxn. Mild TR.   History of pneumonia    Hyperlipidemia    IBS (irritable bowel syndrome)    Ischemic cardiomyopathy    a. 10/2016 Echo: EF 35-40%, Gr1 DD.   Lung cancer [facility])    Mitral regurgitation    Neuromuscular disorder (HCC)    NSTEMI (non-ST elevated myocardial infarction) (HCC) 03/10/2016   Pneumonia 03/2016   Shingles  Squamous cell cancer of skin of nose    Thrombocytosis 03/26/2016   Tobacco abuse    Trichimoniasis    Wears dentures    Wears glasses     Past Surgical History:  Procedure Laterality Date   APPENDECTOMY     BICEPT TENODESIS Left 03/21/2023   Procedure: BICEPS TENODESIS;  Surgeon: Cammy Copa, MD;  Location: [facility] OR;  Service: Orthopedics;  Laterality: Left;   BIOPSY  09/20/2018   Procedure: BIOPSY;  Surgeon: Corbin Ade, MD;  [facility];  Service: Endoscopy;;  colon   BIOPSY  01/05/2021   Procedure: BIOPSY;  Surgeon: Rachael Fee, MD;  Location: [facility] ENDOSCOPY;  Service:  Endoscopy;;   BIOPSY  04/27/2022   Procedure: BIOPSY;  Surgeon: Lanelle Bal, DO;  [facility];  Service: Endoscopy;;   BRONCHIAL BIOPSY  07/03/2023   Procedure: BRONCHIAL BIOPSIES;  Surgeon: Leslye Peer, MD;  Location: [facility] ENDOSCOPY;  Service: Pulmonary;;   BRONCHIAL BRUSHINGS  07/03/2023   Procedure: BRONCHIAL BRUSHINGS;  Surgeon: Leslye Peer, MD;  Location: [facility] ENDOSCOPY;  Service: Pulmonary;;   BRONCHIAL NEEDLE ASPIRATION BIOPSY  07/03/2023   Procedure: BRONCHIAL NEEDLE ASPIRATION BIOPSIES;  Surgeon: Leslye Peer, MD;  Location: [facility] ENDOSCOPY;  Service: Pulmonary;;   BRONCHIAL WASHINGS  07/03/2023   Procedure: BRONCHIAL WASHINGS;  Surgeon: Leslye Peer, MD;  Location: [facility] ENDOSCOPY;  Service: Pulmonary;;   CARDIAC CATHETERIZATION N/A 03/11/2016   Procedure: Left Heart Cath and Coronary Angiography;  Surgeon: Marykay Lex, MD;  Location: [facility] INVASIVE CV LAB;  Service: Cardiovascular;  Laterality: N/A;   CARDIAC CATHETERIZATION N/A 03/11/2016   Procedure: Coronary Stent Intervention;  Surgeon: Marykay Lex, MD;  Location: [facility] INVASIVE CV LAB;  Service: Cardiovascular;  Laterality: N/A;   CARDIAC CATHETERIZATION N/A 03/14/2016   Procedure: Coronary Stent Intervention;  Surgeon: Peter M Swaziland, MD;  Location: [facility] INVASIVE CV LAB;  Service: Cardiovascular;  Laterality: N/A;   CEREBRAL ANEURYSM REPAIR  04/2019   stent placed   CHOLECYSTECTOMY OPEN  1984   COLONOSCOPY WITH PROPOFOL N/A 09/20/2018   Procedure: COLONOSCOPY WITH PROPOFOL;  Surgeon: Corbin Ade, MD;  [facility];  Service: Endoscopy;  Laterality: N/A;  10:30am   CORONARY ANGIOPLASTY WITH STENT PLACEMENT  03/14/2016   ESOPHAGOGASTRODUODENOSCOPY (EGD) WITH PROPOFOL N/A 09/20/2018   Procedure: ESOPHAGOGASTRODUODENOSCOPY (EGD) WITH PROPOFOL;  Surgeon: Corbin Ade, MD;  [facility];  Service: Endoscopy;  Laterality: N/A;   ESOPHAGOGASTRODUODENOSCOPY (EGD) WITH PROPOFOL N/A  01/05/2021   Procedure: ESOPHAGOGASTRODUODENOSCOPY (EGD) WITH PROPOFOL;  Surgeon: Rachael Fee, MD;  Location: [facility] ENDOSCOPY;  Service: Endoscopy;  Laterality: N/A;   ESOPHAGOGASTRODUODENOSCOPY (EGD) WITH PROPOFOL N/A 04/27/2022   Procedure: ESOPHAGOGASTRODUODENOSCOPY (EGD) WITH PROPOFOL;  Surgeon: Lanelle Bal, DO;  [facility];  Service: Endoscopy;  Laterality: N/A;  9:15am, asa 3/4, ASAP   ESOPHAGOGASTRODUODENOSCOPY (EGD) WITH PROPOFOL N/A 01/04/2023   Procedure: ESOPHAGOGASTRODUODENOSCOPY (EGD) WITH PROPOFOL;  Surgeon: Corbin Ade, MD;  [facility];  Service: Endoscopy;  Laterality: N/A;  130pm, asa 3   FIDUCIAL MARKER PLACEMENT  07/03/2023   Procedure: FIDUCIAL MARKER PLACEMENT;  Surgeon: Leslye Peer, MD;  Location: [facility] ENDOSCOPY;  Service: Pulmonary;;   FINGER ARTHROPLASTY Left 05/14/2013   Procedure: LEFT THUMB CARPAL METACARPAL ARTHROPLASTY;  Surgeon: Tami Ribas, MD;  Location: [facility];  Service: Orthopedics;  Laterality: Left;   ICD IMPLANT N/A 04/03/2020   Procedure: ICD  IMPLANT;  Surgeon: Regan Lemming, MD;  Location: [facility] INVASIVE CV LAB;  Service: Cardiovascular;  Laterality: N/A;   IR ANGIO INTRA EXTRACRAN SEL COM CAROTID INNOMINATE BILAT MOD SED  01/05/2017   IR ANGIO INTRA EXTRACRAN SEL COM CAROTID INNOMINATE BILAT MOD SED  03/19/2019   IR ANGIO INTRA EXTRACRAN SEL COM CAROTID INNOMINATE BILAT MOD SED  06/04/2020   IR ANGIO INTRA EXTRACRAN SEL INTERNAL CAROTID UNI L MOD SED  04/15/2019   IR ANGIO VERTEBRAL SEL VERTEBRAL BILAT MOD SED  01/05/2017   IR ANGIO VERTEBRAL SEL VERTEBRAL BILAT MOD SED  03/19/2019   IR ANGIO VERTEBRAL SEL VERTEBRAL UNI L MOD SED  06/04/2020   IR ANGIOGRAM FOLLOW UP STUDY  04/15/2019   IR RADIOLOGIST EVAL & MGMT  12/30/2016   IR TRANSCATH/EMBOLIZ  04/15/2019   IR US GUIDE VASC ACCESS RIGHT  03/19/2019   IR US GUIDE VASC ACCESS RIGHT  06/04/2020   JOINT REPLACEMENT     MALONEY DILATION N/A  09/20/2018   Procedure: Elease Hashimoto DILATION;  Surgeon: Corbin Ade, MD;  [facility];  Service: Endoscopy;  Laterality: N/A;   MALONEY DILATION N/A 01/04/2023   Procedure: Elease Hashimoto DILATION;  Surgeon: Corbin Ade, MD;  [facility];  Service: Endoscopy;  Laterality: N/A;   RADIOLOGY WITH ANESTHESIA N/A 04/15/2019   Procedure: Sharman Crate;  Surgeon: Julieanne Cotton, MD;  Location: [facility] OR;  Service: Radiology;  Laterality: N/A;   REVERSE SHOULDER ARTHROPLASTY Left 03/21/2023   Procedure: LEFT REVERSE SHOULDER ARTHROPLASTY;  Surgeon: Cammy Copa, MD;  Location: [facility] OR;  Service: Orthopedics;  Laterality: Left;   RIGHT/LEFT HEART CATH AND CORONARY ANGIOGRAPHY N/A 08/19/2019   Procedure: RIGHT/LEFT HEART CATH AND CORONARY ANGIOGRAPHY;  Surgeon: Laurey Morale, MD;  Location: [facility] INVASIVE CV LAB;  Service: Cardiovascular;  Laterality: N/A;   TUBAL LIGATION  1987   VAGINAL HYSTERECTOMY  2009    Family History  Problem Relation Age of Onset   Stroke Mother    Hypertension Mother    Diabetes Mother    Heart attack Mother    Heart attack Father    Diabetes Father    Hypertension Father    CAD Father    Colon polyps Father 65       pre-cancerous    Stroke Father    Dementia Father    Hyperlipidemia Father    Arthritis Father    COPD Father        smoked and worked [employer]   Heart disease Father    Breast cancer Maternal Grandmother    Diabetes Maternal Grandmother    Cancer Maternal Grandfather        Tongue and esophageal   Cancer Paternal Grandmother    Anxiety disorder Daughter    Depression Daughter    Anxiety disorder Daughter    Heart failure Other    Colon cancer Neg Hx      Social History   Tobacco Use  Smoking Status Former   Current packs/day: 1.50   Average packs/day: 1.5 packs/day for 40.0 years (60.0 ttl pk-yrs)   Types: Cigarettes  Smokeless Tobacco Never  Tobacco Comments   Smokes off and on 06/14/23 [name],  CMA    No cigarettes since Thursday this week>> 07/06/2023    Social History   Substance and Sexual Activity  Alcohol Use Not Currently   Comment: occasionally     Allergies  Allergen Reactions   Alpha-Gal Shortness Of Breath, Nausea And  Vomiting and Dermatitis   Other Shortness Of Breath, Diarrhea, Nausea And Vomiting and Nausea Only    All- red meats/dairy  Medications in Capsule form    Reglan [Metoclopramide]     Involuntary movements   Tape Other (See Comments)    PEELS SKIN OFF  (PAPER TAPE IS FINE)    Current Outpatient Medications  Medication Sig Dispense Refill   aspirin EC 81 MG tablet Take 1 tablet (81 mg total) by mouth every evening. Okay to restart on 2/29/2025     atorvastatin (LIPITOR) 80 MG tablet Take 1 tablet (80 mg total) by mouth daily. 90 tablet 3   clopidogrel (PLAVIX) 75 MG tablet Take 1 tablet (75 mg total) by mouth daily. Okay to restart on 07/04/2023     dapagliflozin propanediol (FARXIGA) 10 MG TABS tablet Take 1 tablet (10 mg total) by mouth daily before breakfast. 30 tablet 11   EPINEPHrine (EPIPEN 2-PAK) 0.3 mg/0.3 mL IJ SOAJ injection Inject 0.3 mg into the muscle as needed for anaphylaxis. 2 each 2   Evolocumab (REPATHA SURECLICK) 140 MG/ML SOAJ Inject 140 mg into the skin every 14 (fourteen) days. 6 mL 3   fenofibrate (TRICOR) 145 MG tablet Take 1 tablet (145 mg total) by mouth daily. 90 tablet 3   furosemide (LASIX) 20 MG tablet Take 2 tablets (40 mg total) by mouth 2 (two) times daily. 360 tablet 3   gabapentin (NEURONTIN) 300 MG capsule Take 1 capsule by mouth at bedtime 30 capsule 0   hydrOXYzine (VISTARIL) 25 MG capsule Take 1 capsule (25 mg total) by mouth every 8 (eight) hours as needed. 30 capsule 1   linaclotide (LINZESS) 145 MCG CAPS capsule Take 1 capsule (145 mcg total) by mouth daily before breakfast. 30 capsule 11   loperamide (IMODIUM A-D) 2 MG tablet Take 2 mg by mouth 4 (four) times daily as needed for diarrhea or loose stools.      losartan (COZAAR) 25 MG tablet Take 0.5 tablets (12.5 mg total) by mouth at bedtime.     MAGNESIUM-OXIDE 400 (240 Mg) MG tablet Take 1 tablet by mouth daily.     metoprolol succinate (TOPROL XL) 25 MG 24 hr tablet Take 0.5 tablets (12.5 mg total) by mouth at bedtime.     Multiple Vitamins-Minerals (MULTIVITAMIN WITH MINERALS) tablet Take 1 tablet by mouth daily.     nicotine (NICODERM CQ - DOSED IN MG/24 HOURS) 21 mg/24hr patch Place 21 mg onto the skin daily.     nitroGLYCERIN (NITROSTAT) 0.4 MG SL tablet Place 1 tablet (0.4 mg total) under the tongue every 5 (five) minutes x 3 doses as needed for chest pain (if no relief after 2nd dose, proceed to the ED for an evaluation or call 911). 25 tablet 2   ondansetron (ZOFRAN) 8 MG tablet Take 1 tablet (8 mg total) by mouth every 8 (eight) hours as needed for nausea or vomiting. 90 tablet 3   pantoprazole (PROTONIX) 40 MG tablet Take 40 mg by mouth daily.     potassium chloride SA (KLOR-CON M) 20 MEQ tablet Take 2 tablets (40 mEq total) by mouth 2 (two) times daily. 120 tablet 6   rOPINIRole (REQUIP) 0.5 MG tablet Take 0.5 mg by mouth at bedtime.   0   sertraline (ZOLOFT) 50 MG tablet Take 1 tablet (50 mg total) by mouth daily. 30 tablet 3   spironolactone (ALDACTONE) 25 MG tablet Take 1 tablet (25 mg total) by mouth at bedtime. 30 tablet 3  traZODone (DESYREL) 50 MG tablet Take 1 tablet (50 mg total) by mouth at bedtime. 90 tablet 1   Vitamin D, Ergocalciferol, (DRISDOL) 1.25 MG (50000 UNIT) CAPS capsule Take 50,000 Units by mouth every 7 (seven) days.     No current facility-administered medications for this visit.    ROS   PHYSICAL EXAMINATION: There were no vitals taken for this visit. Physical Exam       I have independently reviewed the above radiology studies  and reviewed the findings with the patient.   Recent Lab Findings: Lab Results  Component Value Date   WBC 7.8 07/31/2023   HGB 12.0 07/31/2023   HCT 36.2 07/31/2023    PLT 519 (H) 07/31/2023   GLUCOSE 148 (H) 07/31/2023   CHOL 103 10/31/2022   TRIG 198 (H) 10/31/2022   HDL 52 10/31/2022   LDLCALC 20 10/31/2022   ALT 15 07/31/2023   AST 22 07/31/2023   NA 138 07/31/2023   K 3.9 07/31/2023   CL 102 07/31/2023   CREATININE 1.48 (H) 07/31/2023   BUN 21 07/31/2023   CO2 29 07/31/2023   TSH 0.702 01/24/2023   INR 0.9 09/29/2022   HGBA1C 6.2 (H) 04/26/2022    Diagnostic Studies & Laboratory data:     Recent Radiology Findings:   NM PET Image Initial (PI) Skull Base To Thigh Result Date: 08/07/2023 CLINICAL DATA:  Lung nodule evaluation EXAM: NUCLEAR MEDICINE PET SKULL BASE TO THIGH TECHNIQUE: 5.03 mCi F-18 FDG was injected intravenously. Full-ring PET imaging was performed from the skull base to thigh after the radiotracer. CT data was obtained and used for attenuation correction and anatomic localization. Fasting blood glucose: 103 mg/dl COMPARISON:  CT 40/98/1191 and older FINDINGS: Mediastinal blood pool activity: SUV max 3.0 Liver activity: SUV max 3.5 NECK: Visualized intracranial compartment has near symmetric distribution radiotracer. No specific abnormal uptake seen in the neck including along lymph node change of the posterior triangle, internal jugular or submandibular regions. Incidental CT findings: Visualized paranasal sinuses and mastoid air cells are clear. Grossly preserved parotid glands, submandibular glands and thyroid gland. Prominent vascular calcifications seen along the carotid vasculature. Particularly at the carotid bulb regions. CHEST: There is a ill-defined area of increased radiotracer uptake at the left lung apex with maximum SUV value of 1.6. This corresponds to a subtle opacity on CT with a fiduciary marker. This ground-glass like area is similar extent distribution to the previous CT scan mic set for the marker. No other areas of abnormal right tracer uptake seen in the thorax including along the lung parenchyma, hilum or  mediastinum. No axillary uptake. Incidental CT findings: Small pericardial effusion. Left upper chest battery pack with leads along the right side of the heart. Scattered vascular calcifications are seen including along the coronary arteries. Streak artifact related to patient's left shoulder arthroplasty. No discrete nodal enlargement identified on the noncontrast, attenuation correction CT scan in the axillary regions, hilum or mediastinum. No consolidation, pneumothorax or effusion. ABDOMEN/PELVIS: There is physiologic distribution radiotracer along the parenchymal organs, renal collecting systems, and bowel. Only exception is an area of asymmetric uptake along anorectal region with maximum SUV of 5.5. Please correlate with clinical findings. Incidental CT findings: Grossly preserved appearance to the parenchymal organs including liver, spleen, pancreas. Malrotated appearance of the kidneys without collecting system dilatation or stones along the course of the renal collecting systems. Contracted urinary bladder. Bilateral adrenal nodules are identified. Left-sided focus has Hounsfield unit of -3 and maximal dimension of 2.1  cm. Right-sided focus has maximal dimension 12 mm and Hounsfield units of -2. Both of these lesions are without significant abnormal uptake and be consistent with adenomas. Is also were described as such on MRI of 01/06/2023. Inferior old MR neck aneurysm also identified with maximal dimension 3.1 cm. Visualized bowel is nondilated. Scattered colonic stool. SKELETON: Scattered degenerative changes of the spine and pelvis. Additional areas degenerative changes elsewhere along joints including sternoclavicular joints. Left shoulder arthroplasty. No specific areas of aggressive abnormal radiotracer uptake. IMPRESSION: Ground-glass like area at the left lung apex is again seen and similar to previous CT scan. There is now fiduciary marker in this location. No specific abnormal radiotracer uptake.  Please correlate with results from prior intervention. Otherwise based on today's examination, simple follow up is recommended in 3 months with CT. Slight asymmetric area of uptake along the anorectal region. This could be a physiologic variant but please correlate with clinical findings to exclude lesion. Incidental findings identified including bilateral adrenal adenomas. 3.1 cm infrarenal abdominal aortic aneurysm. Recommend follow-up ultrasound every 3 years. (Ref.: Erin Vasc Surg. 2018; 67:2-77 and Erin Am Coll Radiol 2013;10(10):789-794.) Electronically Signed   By: Karen Kays M.D.   On: 08/07/2023 15:45   ECHOCARDIOGRAM COMPLETE Result Date: 08/03/2023    ECHOCARDIOGRAM REPORT   Patient Name:   Erin [name] Date of Exam: 08/03/2023 Medical Rec #:  [id number]       Height:       60.0 in Accession #:    [id number]      Weight:       106.4 lb Date of Birth:  March 28, 1961      BSA:          1.427 m Patient Age:    62 years        BP:           103/74 mmHg Patient Gender: F               HR:           70 bpm. Exam Location:  Outpatient Procedure: 2D Echo, Strain Analysis, Color Doppler and Cardiac Doppler (Both            Spectral and Color Flow Doppler were utilized during procedure). Indications:    Congestive heart failure  History:        Patient has prior history of Echocardiogram examinations.                 Cardiomyopathy and CHF, Signs/Symptoms:Hypotension and Chest                 Pain; Risk Factors:Hypertension and Former Smoker.  Sonographer:    Lamont Snowball Referring Phys: [phone] ALMA L DIAZ IMPRESSIONS  1. Left ventricular ejection fraction, by estimation, is 35 to 40%. The left ventricle has moderately decreased function. The left ventricle demonstrates global hypokinesis. Left ventricular diastolic parameters are consistent with Grade I diastolic dysfunction (impaired relaxation). The average left ventricular global longitudinal strain is -12.3 %. The global longitudinal strain is abnormal.  2.  Right ventricular systolic function is normal. The right ventricular size is normal. There is normal pulmonary artery systolic pressure. The estimated right ventricular systolic pressure is 23.8 mmHg.  3. Left atrial size was mildly dilated.  4. The mitral valve is normal in structure. Trivial mitral valve regurgitation. No evidence of mitral stenosis.  5. The aortic valve is tricuspid. There is mild calcification of the aortic valve. Aortic valve regurgitation is not  visualized. No aortic stenosis is present.  6. The inferior vena cava is normal in size with greater than 50% respiratory variability, suggesting right atrial pressure of 3 mmHg.  7. A small pericardial effusion is present. FINDINGS  Left Ventricle: Left ventricular ejection fraction, by estimation, is 35 to 40%. The left ventricle has moderately decreased function. The left ventricle demonstrates global hypokinesis. The average left ventricular global longitudinal strain is -12.3 %. Strain was performed and the global longitudinal strain is abnormal. The left ventricular internal cavity size was normal in size. There is no left ventricular hypertrophy. Left ventricular diastolic parameters are consistent with Grade I diastolic dysfunction (impaired relaxation). Right Ventricle: The right ventricular size is normal. No increase in right ventricular wall thickness. Right ventricular systolic function is normal. There is normal pulmonary artery systolic pressure. The tricuspid regurgitant velocity is 2.28 m/s, and  with an assumed right atrial pressure of 3 mmHg, the estimated right ventricular systolic pressure is 23.8 mmHg. Left Atrium: Left atrial size was mildly dilated. Right Atrium: Right atrial size was normal in size. Pericardium: A small pericardial effusion is present. Mitral Valve: The mitral valve is normal in structure. Trivial mitral valve regurgitation. No evidence of mitral valve stenosis. MV peak gradient, 3.0 mmHg. The mean mitral valve  gradient is 1.0 mmHg. Tricuspid Valve: The tricuspid valve is normal in structure. Tricuspid valve regurgitation is mild. Aortic Valve: The aortic valve is tricuspid. There is mild calcification of the aortic valve. Aortic valve regurgitation is not visualized. No aortic stenosis is present. Aortic valve mean gradient measures 3.0 mmHg. Aortic valve peak gradient measures 4.8 mmHg. Aortic valve area, by VTI measures 2.22 cm. Pulmonic Valve: The pulmonic valve was normal in structure. Pulmonic valve regurgitation is trivial. Aorta: The aortic root is normal in size and structure. Venous: The inferior vena cava is normal in size with greater than 50% respiratory variability, suggesting right atrial pressure of 3 mmHg. IAS/Shunts: No atrial level shunt detected by color flow Doppler. Additional Comments: A device lead is visualized in the right ventricle.  LEFT VENTRICLE PLAX 2D LVIDd:         4.90 cm      Diastology LVIDs:         4.40 cm      LV e' medial:    5.00 cm/s LV PW:         1.00 cm      LV E/e' medial:  14.3 LV IVS:        0.90 cm      LV e' lateral:   6.09 cm/s LVOT diam:     2.00 cm      LV E/e' lateral: 11.7 LV SV:         52 LV SV Index:   36           2D Longitudinal Strain LVOT Area:     3.14 cm     2D Strain GLS Avg:     -12.3 %  LV Volumes (MOD) LV vol d, MOD A2C: 79.9 ml LV vol d, MOD A4C: 120.0 ml LV vol s, MOD A2C: 49.6 ml LV vol s, MOD A4C: 73.7 ml LV SV MOD A2C:     30.3 ml LV SV MOD A4C:     120.0 ml LV SV MOD BP:      40.5 ml RIGHT VENTRICLE             IVC RV Basal diam:  2.80 cm  IVC diam: 1.00 cm RV S prime:     10.10 cm/s TAPSE (M-mode): 1.6 cm LEFT ATRIUM             Index        RIGHT ATRIUM           Index LA diam:        3.00 cm 2.10 cm/m   RA Area:     12.40 cm LA Vol (A2C):   37.1 ml 25.99 ml/m  RA Volume:   27.40 ml  19.19 ml/m LA Vol (A4C):   30.5 ml 21.37 ml/m LA Biplane Vol: 34.2 ml 23.96 ml/m  AORTIC VALVE AV Area (Vmax):    2.12 cm AV Area (Vmean):   1.97 cm AV  Area (VTI):     2.22 cm AV Vmax:           110.00 cm/s AV Vmean:          82.400 cm/s AV VTI:            0.234 m AV Peak Grad:      4.8 mmHg AV Mean Grad:      3.0 mmHg LVOT Vmax:         74.20 cm/s LVOT Vmean:        51.600 cm/s LVOT VTI:          0.165 m LVOT/AV VTI ratio: 0.71  AORTA Ao Root diam: 2.90 cm Ao Asc diam:  3.00 cm MITRAL VALVE               TRICUSPID VALVE MV Area (PHT): 4.57 cm    TR Peak grad:   20.8 mmHg MV Area VTI:   2.09 cm    TR Vmax:        228.00 cm/s MV Peak grad:  3.0 mmHg MV Mean grad:  1.0 mmHg    SHUNTS MV Vmax:       0.87 m/s    Systemic VTI:  0.16 m MV Vmean:      58.3 cm/s   Systemic Diam: 2.00 cm MV Decel Time: 166 msec MV E velocity: 71.40 cm/s MV A velocity: 94.40 cm/s MV E/A ratio:  0.76 Dalton McleanMD Electronically signed by Wilfred Lacy Signature Date/Time: 08/03/2023/11:52:39 AM    Final    CUP PACEART INCLINIC DEVICE CHECK Result Date: 07/17/2023 Normal in-clinic ICD check. Thresholds, sensing, and impedance WNL or stable for patient over time. No new episodes. Estimated longevity 75yr18mo. Pt enrolled in remote follow-up.    PFTs:  - FVC: 84% - FEV1: 71% -DLCO: 58%   FINAL MICROSCOPIC DIAGNOSIS:   A.  LEFT LUNG, UPPER LOBE, NODULE, BRUSHING:  - Malignant  - Adenocarcinoma   B.  LEFT LUNG, UPPER LOBE, NODULE, FINE NEEDLE ASPIRATION  BIOPSY:  - Malignant  - Adenocarcinoma    Assessment / Plan:   63yo female with 1.4cm left upper lobe adenocarcinoma.  Marginal pulmonary function with DLCO of 58%.  CHF with EF of 35%, and hx of AICD.     I  spent {CHL ONC TIME VISIT - [id number]} with  the patient face to face in counseling and coordination of care.    Corliss Skains 08/10/2023 3:33 PM

## 2023-08-10 NOTE — H&P (View-Only) (Signed)
 [address]       [place] [address]             [phone]                    [name] Medical Record #[id number] Date of Birth: February 23, 1961  Referring: Si Gaul, MD Primary Care: Billie Lade, MD Primary Cardiologist: Marca Ancona, MD  Chief Complaint:   No chief complaint on file.   History of Present Illness:    Erin 63 y.o. female presents for surgical evaluation of a ***   [name] is a 63 y.o. female.   Discussed the use of AI scribe software for clinical note transcription with the patient, who gave verbal consent to proceed.   History of Present Illness   Erin is a 63 year old female with lung cancer who presents for evaluation of her condition.   She was diagnosed with lung cancer following an incidental finding of a lung nodule during an MRI for stomach issues in 2021. The nodule was monitored until late 2024 when it began to grow. A bronchoscopy on July 03, 2023, confirmed adenocarcinoma of the lung. A recent PET scan did not show concerning findings, and the area of concern did not appear prominently due to its ground glass opacity nature. No chest pain or breathing issues are reported currently.   She has experienced unintentional weight loss, dropping from 133 pounds to 106 pounds, attributed to a lack of appetite despite attempts to snack and consume nutritional supplements like Coronation instant breakfast. No nausea, vomiting, diarrhea, headaches, or changes in vision.   She underwent left shoulder surgery on March 21, 2023, with no complications from the procedure.   Her past medical history is significant for congestive heart failure, with an ejection fraction previously recorded at 35%, a heart attack in 2017, a brain aneurysm, basal cell carcinoma, squamous cell carcinoma, depression, acid reflux, high cholesterol, and surgical history including appendectomy,  cholecystectomy, and hysterectomy. She has an AICD in place due to her cardiac condition.   Smoking Hx: ***      Past Medical History:  Diagnosis Date   AICD (automatic cardioverter/defibrillator) present    Allergic reaction to alpha-gal    Allergy 03/05/2022   Anemia    Anxiety state 03/25/2016   Arthritis    Basal cell carcinoma of forehead    Brain aneurysm    CHF (congestive heart failure) (HCC)    Coronary artery disease    a. 03/11/16 PCI with DES-->Prox/Mid Cx;  b. 03/14/16 PCI with DES x2-->RCA, EF 30-35%.   Depression 06/09/2010   Encounter for general adult medical examination with abnormal findings 05/04/2022   Essential hypertension    Hx   GERD (gastroesophageal reflux disease)    HFrEF (heart failure with reduced ejection fraction) (HCC)    a. 10/2016 Echo: EF 35-40%, Gr1 DD, mild focal basal septal hypertrophy, basal inflat, mid inflat, basal antlat AK. Mid infept/inf/antlat, apical lateral sev HK. Mod MR. mildly reduced RV fxn. Mild TR.   History of pneumonia    Hyperlipidemia    IBS (irritable bowel syndrome)    Ischemic cardiomyopathy    a. 10/2016 Echo: EF 35-40%, Gr1 DD.   Lung cancer [facility])    Mitral regurgitation    Neuromuscular disorder (HCC)    NSTEMI (non-ST elevated myocardial infarction) (HCC) 03/10/2016   Pneumonia 03/2016   Shingles  Squamous cell cancer of skin of nose    Thrombocytosis 03/26/2016   Tobacco abuse    Trichimoniasis    Wears dentures    Wears glasses     Past Surgical History:  Procedure Laterality Date   APPENDECTOMY     BICEPT TENODESIS Left 03/21/2023   Procedure: BICEPS TENODESIS;  Surgeon: Cammy Copa, MD;  Location: [facility] OR;  Service: Orthopedics;  Laterality: Left;   BIOPSY  09/20/2018   Procedure: BIOPSY;  Surgeon: Corbin Ade, MD;  [facility];  Service: Endoscopy;;  colon   BIOPSY  01/05/2021   Procedure: BIOPSY;  Surgeon: Rachael Fee, MD;  Location: [facility] ENDOSCOPY;  Service:  Endoscopy;;   BIOPSY  04/27/2022   Procedure: BIOPSY;  Surgeon: Lanelle Bal, DO;  [facility];  Service: Endoscopy;;   BRONCHIAL BIOPSY  07/03/2023   Procedure: BRONCHIAL BIOPSIES;  Surgeon: Leslye Peer, MD;  Location: [facility] ENDOSCOPY;  Service: Pulmonary;;   BRONCHIAL BRUSHINGS  07/03/2023   Procedure: BRONCHIAL BRUSHINGS;  Surgeon: Leslye Peer, MD;  Location: [facility] ENDOSCOPY;  Service: Pulmonary;;   BRONCHIAL NEEDLE ASPIRATION BIOPSY  07/03/2023   Procedure: BRONCHIAL NEEDLE ASPIRATION BIOPSIES;  Surgeon: Leslye Peer, MD;  Location: [facility] ENDOSCOPY;  Service: Pulmonary;;   BRONCHIAL WASHINGS  07/03/2023   Procedure: BRONCHIAL WASHINGS;  Surgeon: Leslye Peer, MD;  Location: [facility] ENDOSCOPY;  Service: Pulmonary;;   CARDIAC CATHETERIZATION N/A 03/11/2016   Procedure: Left Heart Cath and Coronary Angiography;  Surgeon: Marykay Lex, MD;  Location: [facility] INVASIVE CV LAB;  Service: Cardiovascular;  Laterality: N/A;   CARDIAC CATHETERIZATION N/A 03/11/2016   Procedure: Coronary Stent Intervention;  Surgeon: Marykay Lex, MD;  Location: [facility] INVASIVE CV LAB;  Service: Cardiovascular;  Laterality: N/A;   CARDIAC CATHETERIZATION N/A 03/14/2016   Procedure: Coronary Stent Intervention;  Surgeon: Peter M Swaziland, MD;  Location: [facility] INVASIVE CV LAB;  Service: Cardiovascular;  Laterality: N/A;   CEREBRAL ANEURYSM REPAIR  04/2019   stent placed   CHOLECYSTECTOMY OPEN  1984   COLONOSCOPY WITH PROPOFOL N/A 09/20/2018   Procedure: COLONOSCOPY WITH PROPOFOL;  Surgeon: Corbin Ade, MD;  [facility];  Service: Endoscopy;  Laterality: N/A;  10:30am   CORONARY ANGIOPLASTY WITH STENT PLACEMENT  03/14/2016   ESOPHAGOGASTRODUODENOSCOPY (EGD) WITH PROPOFOL N/A 09/20/2018   Procedure: ESOPHAGOGASTRODUODENOSCOPY (EGD) WITH PROPOFOL;  Surgeon: Corbin Ade, MD;  [facility];  Service: Endoscopy;  Laterality: N/A;   ESOPHAGOGASTRODUODENOSCOPY (EGD) WITH PROPOFOL N/A  01/05/2021   Procedure: ESOPHAGOGASTRODUODENOSCOPY (EGD) WITH PROPOFOL;  Surgeon: Rachael Fee, MD;  Location: [facility] ENDOSCOPY;  Service: Endoscopy;  Laterality: N/A;   ESOPHAGOGASTRODUODENOSCOPY (EGD) WITH PROPOFOL N/A 04/27/2022   Procedure: ESOPHAGOGASTRODUODENOSCOPY (EGD) WITH PROPOFOL;  Surgeon: Lanelle Bal, DO;  [facility];  Service: Endoscopy;  Laterality: N/A;  9:15am, asa 3/4, ASAP   ESOPHAGOGASTRODUODENOSCOPY (EGD) WITH PROPOFOL N/A 01/04/2023   Procedure: ESOPHAGOGASTRODUODENOSCOPY (EGD) WITH PROPOFOL;  Surgeon: Corbin Ade, MD;  [facility];  Service: Endoscopy;  Laterality: N/A;  130pm, asa 3   FIDUCIAL MARKER PLACEMENT  07/03/2023   Procedure: FIDUCIAL MARKER PLACEMENT;  Surgeon: Leslye Peer, MD;  Location: [facility] ENDOSCOPY;  Service: Pulmonary;;   FINGER ARTHROPLASTY Left 05/14/2013   Procedure: LEFT THUMB CARPAL METACARPAL ARTHROPLASTY;  Surgeon: Tami Ribas, MD;  Location: [facility];  Service: Orthopedics;  Laterality: Left;   ICD IMPLANT N/A 04/03/2020   Procedure: ICD  IMPLANT;  Surgeon: Regan Lemming, MD;  Location: [facility] INVASIVE CV LAB;  Service: Cardiovascular;  Laterality: N/A;   IR ANGIO INTRA EXTRACRAN SEL COM CAROTID INNOMINATE BILAT MOD SED  01/05/2017   IR ANGIO INTRA EXTRACRAN SEL COM CAROTID INNOMINATE BILAT MOD SED  03/19/2019   IR ANGIO INTRA EXTRACRAN SEL COM CAROTID INNOMINATE BILAT MOD SED  06/04/2020   IR ANGIO INTRA EXTRACRAN SEL INTERNAL CAROTID UNI L MOD SED  04/15/2019   IR ANGIO VERTEBRAL SEL VERTEBRAL BILAT MOD SED  01/05/2017   IR ANGIO VERTEBRAL SEL VERTEBRAL BILAT MOD SED  03/19/2019   IR ANGIO VERTEBRAL SEL VERTEBRAL UNI L MOD SED  06/04/2020   IR ANGIOGRAM FOLLOW UP STUDY  04/15/2019   IR RADIOLOGIST EVAL & MGMT  12/30/2016   IR TRANSCATH/EMBOLIZ  04/15/2019   IR US GUIDE VASC ACCESS RIGHT  03/19/2019   IR US GUIDE VASC ACCESS RIGHT  06/04/2020   JOINT REPLACEMENT     MALONEY DILATION N/A  09/20/2018   Procedure: Elease Hashimoto DILATION;  Surgeon: Corbin Ade, MD;  [facility];  Service: Endoscopy;  Laterality: N/A;   MALONEY DILATION N/A 01/04/2023   Procedure: Elease Hashimoto DILATION;  Surgeon: Corbin Ade, MD;  [facility];  Service: Endoscopy;  Laterality: N/A;   RADIOLOGY WITH ANESTHESIA N/A 04/15/2019   Procedure: Sharman Crate;  Surgeon: Julieanne Cotton, MD;  Location: [facility] OR;  Service: Radiology;  Laterality: N/A;   REVERSE SHOULDER ARTHROPLASTY Left 03/21/2023   Procedure: LEFT REVERSE SHOULDER ARTHROPLASTY;  Surgeon: Cammy Copa, MD;  Location: [facility] OR;  Service: Orthopedics;  Laterality: Left;   RIGHT/LEFT HEART CATH AND CORONARY ANGIOGRAPHY N/A 08/19/2019   Procedure: RIGHT/LEFT HEART CATH AND CORONARY ANGIOGRAPHY;  Surgeon: Laurey Morale, MD;  Location: [facility] INVASIVE CV LAB;  Service: Cardiovascular;  Laterality: N/A;   TUBAL LIGATION  1987   VAGINAL HYSTERECTOMY  2009    Family History  Problem Relation Age of Onset   Stroke Mother    Hypertension Mother    Diabetes Mother    Heart attack Mother    Heart attack Father    Diabetes Father    Hypertension Father    CAD Father    Colon polyps Father 65       pre-cancerous    Stroke Father    Dementia Father    Hyperlipidemia Father    Arthritis Father    COPD Father        smoked and worked [employer]   Heart disease Father    Breast cancer Maternal Grandmother    Diabetes Maternal Grandmother    Cancer Maternal Grandfather        Tongue and esophageal   Cancer Paternal Grandmother    Anxiety disorder Daughter    Depression Daughter    Anxiety disorder Daughter    Heart failure Other    Colon cancer Neg Hx      Social History   Tobacco Use  Smoking Status Former   Current packs/day: 1.50   Average packs/day: 1.5 packs/day for 40.0 years (60.0 ttl pk-yrs)   Types: Cigarettes  Smokeless Tobacco Never  Tobacco Comments   Smokes off and on 06/14/23 [name],  CMA    No cigarettes since Thursday this week>> 07/06/2023    Social History   Substance and Sexual Activity  Alcohol Use Not Currently   Comment: occasionally     Allergies  Allergen Reactions   Alpha-Gal Shortness Of Breath, Nausea And  Vomiting and Dermatitis   Other Shortness Of Breath, Diarrhea, Nausea And Vomiting and Nausea Only    All- red meats/dairy  Medications in Capsule form    Reglan [Metoclopramide]     Involuntary movements   Tape Other (See Comments)    PEELS SKIN OFF  (PAPER TAPE IS FINE)    Current Outpatient Medications  Medication Sig Dispense Refill   aspirin EC 81 MG tablet Take 1 tablet (81 mg total) by mouth every evening. Okay to restart on 2/29/2025     atorvastatin (LIPITOR) 80 MG tablet Take 1 tablet (80 mg total) by mouth daily. 90 tablet 3   clopidogrel (PLAVIX) 75 MG tablet Take 1 tablet (75 mg total) by mouth daily. Okay to restart on 07/04/2023     dapagliflozin propanediol (FARXIGA) 10 MG TABS tablet Take 1 tablet (10 mg total) by mouth daily before breakfast. 30 tablet 11   EPINEPHrine (EPIPEN 2-PAK) 0.3 mg/0.3 mL IJ SOAJ injection Inject 0.3 mg into the muscle as needed for anaphylaxis. 2 each 2   Evolocumab (REPATHA SURECLICK) 140 MG/ML SOAJ Inject 140 mg into the skin every 14 (fourteen) days. 6 mL 3   fenofibrate (TRICOR) 145 MG tablet Take 1 tablet (145 mg total) by mouth daily. 90 tablet 3   furosemide (LASIX) 20 MG tablet Take 2 tablets (40 mg total) by mouth 2 (two) times daily. 360 tablet 3   gabapentin (NEURONTIN) 300 MG capsule Take 1 capsule by mouth at bedtime 30 capsule 0   hydrOXYzine (VISTARIL) 25 MG capsule Take 1 capsule (25 mg total) by mouth every 8 (eight) hours as needed. 30 capsule 1   linaclotide (LINZESS) 145 MCG CAPS capsule Take 1 capsule (145 mcg total) by mouth daily before breakfast. 30 capsule 11   loperamide (IMODIUM A-D) 2 MG tablet Take 2 mg by mouth 4 (four) times daily as needed for diarrhea or loose stools.      losartan (COZAAR) 25 MG tablet Take 0.5 tablets (12.5 mg total) by mouth at bedtime.     MAGNESIUM-OXIDE 400 (240 Mg) MG tablet Take 1 tablet by mouth daily.     metoprolol succinate (TOPROL XL) 25 MG 24 hr tablet Take 0.5 tablets (12.5 mg total) by mouth at bedtime.     Multiple Vitamins-Minerals (MULTIVITAMIN WITH MINERALS) tablet Take 1 tablet by mouth daily.     nicotine (NICODERM CQ - DOSED IN MG/24 HOURS) 21 mg/24hr patch Place 21 mg onto the skin daily.     nitroGLYCERIN (NITROSTAT) 0.4 MG SL tablet Place 1 tablet (0.4 mg total) under the tongue every 5 (five) minutes x 3 doses as needed for chest pain (if no relief after 2nd dose, proceed to the ED for an evaluation or call 911). 25 tablet 2   ondansetron (ZOFRAN) 8 MG tablet Take 1 tablet (8 mg total) by mouth every 8 (eight) hours as needed for nausea or vomiting. 90 tablet 3   pantoprazole (PROTONIX) 40 MG tablet Take 40 mg by mouth daily.     potassium chloride SA (KLOR-CON M) 20 MEQ tablet Take 2 tablets (40 mEq total) by mouth 2 (two) times daily. 120 tablet 6   rOPINIRole (REQUIP) 0.5 MG tablet Take 0.5 mg by mouth at bedtime.   0   sertraline (ZOLOFT) 50 MG tablet Take 1 tablet (50 mg total) by mouth daily. 30 tablet 3   spironolactone (ALDACTONE) 25 MG tablet Take 1 tablet (25 mg total) by mouth at bedtime. 30 tablet 3  traZODone (DESYREL) 50 MG tablet Take 1 tablet (50 mg total) by mouth at bedtime. 90 tablet 1   Vitamin D, Ergocalciferol, (DRISDOL) 1.25 MG (50000 UNIT) CAPS capsule Take 50,000 Units by mouth every 7 (seven) days.     No current facility-administered medications for this visit.    ROS   PHYSICAL EXAMINATION: There were no vitals taken for this visit. Physical Exam       I have independently reviewed the above radiology studies  and reviewed the findings with the patient.   Recent Lab Findings: Lab Results  Component Value Date   WBC 7.8 07/31/2023   HGB 12.0 07/31/2023   HCT 36.2 07/31/2023    PLT 519 (H) 07/31/2023   GLUCOSE 148 (H) 07/31/2023   CHOL 103 10/31/2022   TRIG 198 (H) 10/31/2022   HDL 52 10/31/2022   LDLCALC 20 10/31/2022   ALT 15 07/31/2023   AST 22 07/31/2023   NA 138 07/31/2023   K 3.9 07/31/2023   CL 102 07/31/2023   CREATININE 1.48 (H) 07/31/2023   BUN 21 07/31/2023   CO2 29 07/31/2023   TSH 0.702 01/24/2023   INR 0.9 09/29/2022   HGBA1C 6.2 (H) 04/26/2022    Diagnostic Studies & Laboratory data:     Recent Radiology Findings:   NM PET Image Initial (PI) Skull Base To Thigh Result Date: 08/07/2023 CLINICAL DATA:  Lung nodule evaluation EXAM: NUCLEAR MEDICINE PET SKULL BASE TO THIGH TECHNIQUE: 5.03 mCi F-18 FDG was injected intravenously. Full-ring PET imaging was performed from the skull base to thigh after the radiotracer. CT data was obtained and used for attenuation correction and anatomic localization. Fasting blood glucose: 103 mg/dl COMPARISON:  CT 40/98/1191 and older FINDINGS: Mediastinal blood pool activity: SUV max 3.0 Liver activity: SUV max 3.5 NECK: Visualized intracranial compartment has near symmetric distribution radiotracer. No specific abnormal uptake seen in the neck including along lymph node change of the posterior triangle, internal jugular or submandibular regions. Incidental CT findings: Visualized paranasal sinuses and mastoid air cells are clear. Grossly preserved parotid glands, submandibular glands and thyroid gland. Prominent vascular calcifications seen along the carotid vasculature. Particularly at the carotid bulb regions. CHEST: There is a ill-defined area of increased radiotracer uptake at the left lung apex with maximum SUV value of 1.6. This corresponds to a subtle opacity on CT with a fiduciary marker. This ground-glass like area is similar extent distribution to the previous CT scan mic set for the marker. No other areas of abnormal right tracer uptake seen in the thorax including along the lung parenchyma, hilum or  mediastinum. No axillary uptake. Incidental CT findings: Small pericardial effusion. Left upper chest battery pack with leads along the right side of the heart. Scattered vascular calcifications are seen including along the coronary arteries. Streak artifact related to patient's left shoulder arthroplasty. No discrete nodal enlargement identified on the noncontrast, attenuation correction CT scan in the axillary regions, hilum or mediastinum. No consolidation, pneumothorax or effusion. ABDOMEN/PELVIS: There is physiologic distribution radiotracer along the parenchymal organs, renal collecting systems, and bowel. Only exception is an area of asymmetric uptake along anorectal region with maximum SUV of 5.5. Please correlate with clinical findings. Incidental CT findings: Grossly preserved appearance to the parenchymal organs including liver, spleen, pancreas. Malrotated appearance of the kidneys without collecting system dilatation or stones along the course of the renal collecting systems. Contracted urinary bladder. Bilateral adrenal nodules are identified. Left-sided focus has Hounsfield unit of -3 and maximal dimension of 2.1  cm. Right-sided focus has maximal dimension 12 mm and Hounsfield units of -2. Both of these lesions are without significant abnormal uptake and be consistent with adenomas. Is also were described as such on MRI of 01/06/2023. Inferior old MR neck aneurysm also identified with maximal dimension 3.1 cm. Visualized bowel is nondilated. Scattered colonic stool. SKELETON: Scattered degenerative changes of the spine and pelvis. Additional areas degenerative changes elsewhere along joints including sternoclavicular joints. Left shoulder arthroplasty. No specific areas of aggressive abnormal radiotracer uptake. IMPRESSION: Ground-glass like area at the left lung apex is again seen and similar to previous CT scan. There is now fiduciary marker in this location. No specific abnormal radiotracer uptake.  Please correlate with results from prior intervention. Otherwise based on today's examination, simple follow up is recommended in 3 months with CT. Slight asymmetric area of uptake along the anorectal region. This could be a physiologic variant but please correlate with clinical findings to exclude lesion. Incidental findings identified including bilateral adrenal adenomas. 3.1 cm infrarenal abdominal aortic aneurysm. Recommend follow-up ultrasound every 3 years. (Ref.: Erin Vasc Surg. 2018; 67:2-77 and Erin Am Coll Radiol 2013;10(10):789-794.) Electronically Signed   By: Karen Kays M.D.   On: 08/07/2023 15:45   ECHOCARDIOGRAM COMPLETE Result Date: 08/03/2023    ECHOCARDIOGRAM REPORT   Patient Name:   Erin [name] Date of Exam: 08/03/2023 Medical Rec #:  [id number]       Height:       60.0 in Accession #:    [id number]      Weight:       106.4 lb Date of Birth:  March 28, 1961      BSA:          1.427 m Patient Age:    62 years        BP:           103/74 mmHg Patient Gender: F               HR:           70 bpm. Exam Location:  Outpatient Procedure: 2D Echo, Strain Analysis, Color Doppler and Cardiac Doppler (Both            Spectral and Color Flow Doppler were utilized during procedure). Indications:    Congestive heart failure  History:        Patient has prior history of Echocardiogram examinations.                 Cardiomyopathy and CHF, Signs/Symptoms:Hypotension and Chest                 Pain; Risk Factors:Hypertension and Former Smoker.  Sonographer:    Lamont Snowball Referring Phys: [phone] Erin L DIAZ IMPRESSIONS  1. Left ventricular ejection fraction, by estimation, is 35 to 40%. The left ventricle has moderately decreased function. The left ventricle demonstrates global hypokinesis. Left ventricular diastolic parameters are consistent with Grade I diastolic dysfunction (impaired relaxation). The average left ventricular global longitudinal strain is -12.3 %. The global longitudinal strain is abnormal.  2.  Right ventricular systolic function is normal. The right ventricular size is normal. There is normal pulmonary artery systolic pressure. The estimated right ventricular systolic pressure is 23.8 mmHg.  3. Left atrial size was mildly dilated.  4. The mitral valve is normal in structure. Trivial mitral valve regurgitation. No evidence of mitral stenosis.  5. The aortic valve is tricuspid. There is mild calcification of the aortic valve. Aortic valve regurgitation is not  visualized. No aortic stenosis is present.  6. The inferior vena cava is normal in size with greater than 50% respiratory variability, suggesting right atrial pressure of 3 mmHg.  7. A small pericardial effusion is present. FINDINGS  Left Ventricle: Left ventricular ejection fraction, by estimation, is 35 to 40%. The left ventricle has moderately decreased function. The left ventricle demonstrates global hypokinesis. The average left ventricular global longitudinal strain is -12.3 %. Strain was performed and the global longitudinal strain is abnormal. The left ventricular internal cavity size was normal in size. There is no left ventricular hypertrophy. Left ventricular diastolic parameters are consistent with Grade I diastolic dysfunction (impaired relaxation). Right Ventricle: The right ventricular size is normal. No increase in right ventricular wall thickness. Right ventricular systolic function is normal. There is normal pulmonary artery systolic pressure. The tricuspid regurgitant velocity is 2.28 m/s, and  with an assumed right atrial pressure of 3 mmHg, the estimated right ventricular systolic pressure is 23.8 mmHg. Left Atrium: Left atrial size was mildly dilated. Right Atrium: Right atrial size was normal in size. Pericardium: A small pericardial effusion is present. Mitral Valve: The mitral valve is normal in structure. Trivial mitral valve regurgitation. No evidence of mitral valve stenosis. MV peak gradient, 3.0 mmHg. The mean mitral valve  gradient is 1.0 mmHg. Tricuspid Valve: The tricuspid valve is normal in structure. Tricuspid valve regurgitation is mild. Aortic Valve: The aortic valve is tricuspid. There is mild calcification of the aortic valve. Aortic valve regurgitation is not visualized. No aortic stenosis is present. Aortic valve mean gradient measures 3.0 mmHg. Aortic valve peak gradient measures 4.8 mmHg. Aortic valve area, by VTI measures 2.22 cm. Pulmonic Valve: The pulmonic valve was normal in structure. Pulmonic valve regurgitation is trivial. Aorta: The aortic root is normal in size and structure. Venous: The inferior vena cava is normal in size with greater than 50% respiratory variability, suggesting right atrial pressure of 3 mmHg. IAS/Shunts: No atrial level shunt detected by color flow Doppler. Additional Comments: A device lead is visualized in the right ventricle.  LEFT VENTRICLE PLAX 2D LVIDd:         4.90 cm      Diastology LVIDs:         4.40 cm      LV e' medial:    5.00 cm/s LV PW:         1.00 cm      LV E/e' medial:  14.3 LV IVS:        0.90 cm      LV e' lateral:   6.09 cm/s LVOT diam:     2.00 cm      LV E/e' lateral: 11.7 LV SV:         52 LV SV Index:   36           2D Longitudinal Strain LVOT Area:     3.14 cm     2D Strain GLS Avg:     -12.3 %  LV Volumes (MOD) LV vol d, MOD A2C: 79.9 ml LV vol d, MOD A4C: 120.0 ml LV vol s, MOD A2C: 49.6 ml LV vol s, MOD A4C: 73.7 ml LV SV MOD A2C:     30.3 ml LV SV MOD A4C:     120.0 ml LV SV MOD BP:      40.5 ml RIGHT VENTRICLE             IVC RV Basal diam:  2.80 cm  IVC diam: 1.00 cm RV S prime:     10.10 cm/s TAPSE (M-mode): 1.6 cm LEFT ATRIUM             Index        RIGHT ATRIUM           Index LA diam:        3.00 cm 2.10 cm/m   RA Area:     12.40 cm LA Vol (A2C):   37.1 ml 25.99 ml/m  RA Volume:   27.40 ml  19.19 ml/m LA Vol (A4C):   30.5 ml 21.37 ml/m LA Biplane Vol: 34.2 ml 23.96 ml/m  AORTIC VALVE AV Area (Vmax):    2.12 cm AV Area (Vmean):   1.97 cm AV  Area (VTI):     2.22 cm AV Vmax:           110.00 cm/s AV Vmean:          82.400 cm/s AV VTI:            0.234 m AV Peak Grad:      4.8 mmHg AV Mean Grad:      3.0 mmHg LVOT Vmax:         74.20 cm/s LVOT Vmean:        51.600 cm/s LVOT VTI:          0.165 m LVOT/AV VTI ratio: 0.71  AORTA Ao Root diam: 2.90 cm Ao Asc diam:  3.00 cm MITRAL VALVE               TRICUSPID VALVE MV Area (PHT): 4.57 cm    TR Peak grad:   20.8 mmHg MV Area VTI:   2.09 cm    TR Vmax:        228.00 cm/s MV Peak grad:  3.0 mmHg MV Mean grad:  1.0 mmHg    SHUNTS MV Vmax:       0.87 m/s    Systemic VTI:  0.16 m MV Vmean:      58.3 cm/s   Systemic Diam: 2.00 cm MV Decel Time: 166 msec MV E velocity: 71.40 cm/s MV A velocity: 94.40 cm/s MV E/A ratio:  0.76 Dalton McleanMD Electronically signed by Wilfred Lacy Signature Date/Time: 08/03/2023/11:52:39 AM    Final    CUP PACEART INCLINIC DEVICE CHECK Result Date: 07/17/2023 Normal in-clinic ICD check. Thresholds, sensing, and impedance WNL or stable for patient over time. No new episodes. Estimated longevity 75yr18mo. Pt enrolled in remote follow-up.    PFTs:  - FVC: 84% - FEV1: 71% -DLCO: 58%   FINAL MICROSCOPIC DIAGNOSIS:   A.  LEFT LUNG, UPPER LOBE, NODULE, BRUSHING:  - Malignant  - Adenocarcinoma   B.  LEFT LUNG, UPPER LOBE, NODULE, FINE NEEDLE ASPIRATION  BIOPSY:  - Malignant  - Adenocarcinoma    Assessment / Plan:   63yo female with 1.4cm left upper lobe adenocarcinoma.  Marginal pulmonary function with DLCO of 58%.  CHF with EF of 35%, and hx of AICD.     I  spent {CHL ONC TIME VISIT - WGNFA:[id number]} with  the patient face to face in counseling and coordination of care.    Corliss Skains 08/10/2023 3:33 PM

## 2023-08-11 ENCOUNTER — Other Ambulatory Visit: Payer: Self-pay | Admitting: Thoracic Surgery (Cardiothoracic Vascular Surgery)

## 2023-08-11 ENCOUNTER — Other Ambulatory Visit: Payer: Self-pay | Admitting: *Deleted

## 2023-08-11 ENCOUNTER — Encounter: Payer: Self-pay | Admitting: *Deleted

## 2023-08-11 ENCOUNTER — Institutional Professional Consult (permissible substitution) (INDEPENDENT_AMBULATORY_CARE_PROVIDER_SITE_OTHER): Admitting: Thoracic Surgery (Cardiothoracic Vascular Surgery)

## 2023-08-11 VITALS — BP 97/56 | HR 75 | Resp 20 | Ht 60.0 in | Wt 110.0 lb

## 2023-08-11 DIAGNOSIS — C3412 Malignant neoplasm of upper lobe, left bronchus or lung: Secondary | ICD-10-CM

## 2023-08-13 ENCOUNTER — Other Ambulatory Visit: Payer: Self-pay | Admitting: Internal Medicine

## 2023-08-13 DIAGNOSIS — G6289 Other specified polyneuropathies: Secondary | ICD-10-CM

## 2023-08-14 ENCOUNTER — Encounter: Payer: Self-pay | Admitting: Cardiology

## 2023-08-14 ENCOUNTER — Ambulatory Visit (HOSPITAL_COMMUNITY): Attending: Thoracic Surgery (Cardiothoracic Vascular Surgery)

## 2023-08-14 DIAGNOSIS — Z01810 Encounter for preprocedural cardiovascular examination: Secondary | ICD-10-CM | POA: Diagnosis not present

## 2023-08-14 DIAGNOSIS — C3412 Malignant neoplasm of upper lobe, left bronchus or lung: Secondary | ICD-10-CM | POA: Insufficient documentation

## 2023-08-14 LAB — MYOCARDIAL PERFUSION IMAGING
LV dias vol: 87 mL (ref 46–106)
LV sys vol: 57 mL
Nuc Stress EF: 35 %
Peak HR: 95 {beats}/min
Rest HR: 68 {beats}/min
Rest Nuclear Isotope Dose: 9.9 mCi
SDS: 1
SRS: 11
SSS: 12
ST Depression (mm): 0 mm
Stress Nuclear Isotope Dose: 32.1 mCi
TID: 1.12

## 2023-08-14 MED ORDER — TECHNETIUM TC 99M TETROFOSMIN IV KIT
9.9000 | PACK | Freq: Once | INTRAVENOUS | Status: AC | PRN
  Administered 2023-08-14: 9.9 via INTRAVENOUS

## 2023-08-14 MED ORDER — REGADENOSON 0.4 MG/5ML IV SOLN
0.4000 mg | Freq: Once | INTRAVENOUS | Status: AC
  Administered 2023-08-14: 0.4 mg via INTRAVENOUS

## 2023-08-14 MED ORDER — TECHNETIUM TC 99M TETROFOSMIN IV KIT
32.1000 | PACK | Freq: Once | INTRAVENOUS | Status: AC | PRN
  Administered 2023-08-14: 32.1 via INTRAVENOUS

## 2023-08-14 NOTE — Progress Notes (Signed)
 PERIOPERATIVE PRESCRIPTION FOR IMPLANTED CARDIAC DEVICE PROGRAMMING  Patient Information: Name:  Erin [name]  DOB:  Apr 01, 1961  MRN:  [id number]    Planned Procedure:  Left Robotic Assisted Thoracoscopy for Left Upper Lobectomy  Surgeon:  Dr. Brynda Greathouse  Date of Procedure:  Thursday, April 10, 25.  Cautery will be used.  Position during surgery:  Supine   Please send documentation back to:  [facility] (Fax # [phone])  Device Information:  Clinic EP Physician:  Loman Brooklyn, MD   Device Type:  Defibrillator Manufacturer and Phone #:  Medtronic: [phone] Pacemaker Dependent?:  No. Date of Last Device Check:  07/18/23 Normal Device Function?:  Yes.    Electrophysiologist's Recommendations:  Have magnet available. Provide continuous ECG monitoring when magnet is used or reprogramming is to be performed.  Procedure will likely interfere with device function.  Device should be programmed:  Tachy therapies disabled  Per Device Clinic Standing Orders, Lenor Coffin, RN  3:11 PM 08/14/2023

## 2023-08-14 NOTE — Progress Notes (Signed)
 Surgical Instructions   Your procedure is scheduled on Thursday, April 10, 25.. Report to [facility] Main Entrance "A" at 6:00 A.M., then check in with the Admitting office. Any questions or running late day of surgery: call [phone]  Questions prior to your surgery date: call [phone], Monday-Friday, 8am-4pm. If you experience any cold or flu symptoms such as cough, fever, chills, shortness of breath, etc. between now and your scheduled surgery, please notify us at the above number.     Remember:  Do not eat or drink after midnight the night before your surgery    Take these medicines the morning of surgery with A SIP OF WATER  atorvastatin (LIPITOR)  Evolocumab (REPATHA SURECLICK)  fenofibrate (TRICOR)  gabapentin (NEURONTIN)  metoprolol succinate (TOPROL XL)  pantoprazole (PROTONIX)  sertraline (ZOLOFT)    May take these medicines IF NEEDED: EPINEPHrine (EPIPEN 2-PAK)  hydrOXYzine (VISTARIL)  ondansetron (ZOFRAN)   Per your surgeon, clopidogrel (PLAVIX) will need to be held for 5 days prior to surgery. Your last dose should be on Saturday, April 5th.    One week prior to surgery, STOP taking any Aspirin (unless otherwise instructed by your surgeon) Aleve, Naproxen, Ibuprofen, Motrin, Advil, Goody's, BC's, all herbal medications, fish oil, and non-prescription vitamins.                     Do NOT Smoke (Tobacco/Vaping) for 24 hours prior to your procedure.  If you use a CPAP at night, you may bring your mask/headgear for your overnight stay.   You will be asked to remove any contacts, glasses, piercing's, hearing aid's, dentures/partials prior to surgery. Please bring cases for these items if needed.    Patients discharged the day of surgery will not be allowed to drive home, and someone needs to stay with them for 24 hours.  SURGICAL WAITING ROOM VISITATION Patients may have no more than 2 support people in the waiting area - these visitors may rotate.   Pre-op  nurse will coordinate an appropriate time for 1 ADULT support person, who may not rotate, to accompany patient in pre-op.  Children under the age of 3 must have an adult with them who is not the patient and must remain in the main waiting area with an adult.  If the patient needs to stay at the hospital during part of their recovery, the visitor guidelines for inpatient rooms apply.  Please refer to the [facility] website for the visitor guidelines for any additional information.   If you received a COVID test during your pre-op visit  it is requested that you wear a mask when out in public, stay away from anyone that may not be feeling well and notify your surgeon if you develop symptoms. If you have been in contact with anyone that has tested positive in the last 10 days please notify you surgeon.      Pre-operative CHG Bathing Instructions   You can play a key role in reducing the risk of infection after surgery. Your skin needs to be as free of germs as possible. You can reduce the number of germs on your skin by washing with CHG (chlorhexidine gluconate) soap before surgery. CHG is an antiseptic soap that kills germs and continues to kill germs even after washing.   DO NOT use if you have an allergy to chlorhexidine/CHG or antibacterial soaps. If your skin becomes reddened or irritated, stop using the CHG and notify one of our RNs at [phone].  TAKE A SHOWER THE NIGHT BEFORE SURGERY AND THE DAY OF SURGERY    Please keep in mind the following:  DO NOT shave, including legs and underarms, 48 hours prior to surgery.   You may shave your face before/day of surgery.  Place clean sheets on your bed the night before surgery Use a clean washcloth (not used since being washed) for each shower. DO NOT sleep with pet's night before surgery.  CHG Shower Instructions:  Wash your face and private area with normal soap. If you choose to wash your hair, wash first with your normal  shampoo.  After you use shampoo/soap, rinse your hair and body thoroughly to remove shampoo/soap residue.  Turn the water OFF and apply half the bottle of CHG soap to a CLEAN washcloth.  Apply CHG soap ONLY FROM YOUR NECK DOWN TO YOUR TOES (washing for 3-5 minutes)  DO NOT use CHG soap on face, private areas, open wounds, or sores.  Pay special attention to the area where your surgery is being performed.  If you are having back surgery, having someone wash your back for you may be helpful. Wait 2 minutes after CHG soap is applied, then you may rinse off the CHG soap.  Pat dry with a clean towel  Put on clean pajamas    Additional instructions for the day of surgery: DO NOT APPLY any lotions, deodorants, cologne, or perfumes.   Do not wear jewelry or makeup Do not wear nail polish, gel polish, artificial nails, or any other type of covering on natural nails (fingers and toes) Do not bring valuables to the hospital. [facility] is not responsible for valuables/personal belongings. Put on clean/comfortable clothes.  Please brush your teeth.  Ask your nurse before applying any prescription medications to the skin.

## 2023-08-15 ENCOUNTER — Ambulatory Visit (HOSPITAL_COMMUNITY)
Admission: RE | Admit: 2023-08-15 | Discharge: 2023-08-15 | Disposition: A | Discharge status: Home / Self Care | Source: Ambulatory Visit | Attending: Thoracic Surgery (Cardiothoracic Vascular Surgery)

## 2023-08-15 ENCOUNTER — Encounter (HOSPITAL_COMMUNITY): Payer: Self-pay

## 2023-08-15 ENCOUNTER — Encounter (HOSPITAL_COMMUNITY)
Admission: RE | Admit: 2023-08-15 | Discharge: 2023-08-15 | Disposition: A | Discharge status: Home / Self Care | Source: Ambulatory Visit | Attending: Internal Medicine | Admitting: Internal Medicine

## 2023-08-15 ENCOUNTER — Other Ambulatory Visit: Payer: Self-pay

## 2023-08-15 ENCOUNTER — Ambulatory Visit (HOSPITAL_COMMUNITY)
Admission: RE | Admit: 2023-08-15 | Discharge: 2023-08-15 | Disposition: A | Discharge status: Home / Self Care | Source: Ambulatory Visit | Attending: Cardiology | Admitting: Cardiology

## 2023-08-15 VITALS — BP 106/68 | Temp 97.8°F | Resp 18 | Ht 60.0 in | Wt 107.0 lb

## 2023-08-15 VITALS — BP 114/80 | HR 70 | Ht 60.0 in | Wt 108.2 lb

## 2023-08-15 DIAGNOSIS — N189 Chronic kidney disease, unspecified: Secondary | ICD-10-CM | POA: Insufficient documentation

## 2023-08-15 DIAGNOSIS — I6522 Occlusion and stenosis of left carotid artery: Secondary | ICD-10-CM | POA: Insufficient documentation

## 2023-08-15 DIAGNOSIS — I130 Hypertensive heart and chronic kidney disease with heart failure and stage 1 through stage 4 chronic kidney disease, or unspecified chronic kidney disease: Secondary | ICD-10-CM | POA: Diagnosis not present

## 2023-08-15 DIAGNOSIS — D75839 Thrombocytosis, unspecified: Secondary | ICD-10-CM | POA: Insufficient documentation

## 2023-08-15 DIAGNOSIS — I255 Ischemic cardiomyopathy: Secondary | ICD-10-CM | POA: Diagnosis not present

## 2023-08-15 DIAGNOSIS — Z955 Presence of coronary angioplasty implant and graft: Secondary | ICD-10-CM | POA: Insufficient documentation

## 2023-08-15 DIAGNOSIS — I5042 Chronic combined systolic (congestive) and diastolic (congestive) heart failure: Secondary | ICD-10-CM | POA: Diagnosis not present

## 2023-08-15 DIAGNOSIS — Z7982 Long term (current) use of aspirin: Secondary | ICD-10-CM | POA: Insufficient documentation

## 2023-08-15 DIAGNOSIS — R9431 Abnormal electrocardiogram [ECG] [EKG]: Secondary | ICD-10-CM | POA: Diagnosis not present

## 2023-08-15 DIAGNOSIS — Z91014 Allergy to mammalian meats: Secondary | ICD-10-CM | POA: Insufficient documentation

## 2023-08-15 DIAGNOSIS — Z4502 Encounter for adjustment and management of automatic implantable cardiac defibrillator: Secondary | ICD-10-CM | POA: Insufficient documentation

## 2023-08-15 DIAGNOSIS — Z79899 Other long term (current) drug therapy: Secondary | ICD-10-CM | POA: Insufficient documentation

## 2023-08-15 DIAGNOSIS — Z87891 Personal history of nicotine dependence: Secondary | ICD-10-CM | POA: Diagnosis not present

## 2023-08-15 DIAGNOSIS — Z85828 Personal history of other malignant neoplasm of skin: Secondary | ICD-10-CM | POA: Insufficient documentation

## 2023-08-15 DIAGNOSIS — C3412 Malignant neoplasm of upper lobe, left bronchus or lung: Secondary | ICD-10-CM | POA: Diagnosis not present

## 2023-08-15 DIAGNOSIS — I671 Cerebral aneurysm, nonruptured: Secondary | ICD-10-CM | POA: Insufficient documentation

## 2023-08-15 DIAGNOSIS — Z7902 Long term (current) use of antithrombotics/antiplatelets: Secondary | ICD-10-CM | POA: Diagnosis not present

## 2023-08-15 DIAGNOSIS — Z01818 Encounter for other preprocedural examination: Secondary | ICD-10-CM | POA: Diagnosis present

## 2023-08-15 DIAGNOSIS — I252 Old myocardial infarction: Secondary | ICD-10-CM | POA: Diagnosis not present

## 2023-08-15 DIAGNOSIS — I2510 Atherosclerotic heart disease of native coronary artery without angina pectoris: Secondary | ICD-10-CM | POA: Insufficient documentation

## 2023-08-15 DIAGNOSIS — K219 Gastro-esophageal reflux disease without esophagitis: Secondary | ICD-10-CM | POA: Insufficient documentation

## 2023-08-15 DIAGNOSIS — E785 Hyperlipidemia, unspecified: Secondary | ICD-10-CM | POA: Diagnosis not present

## 2023-08-15 LAB — CBC
HCT: 33.3 % — ABNORMAL LOW (ref 36.0–46.0)
Hemoglobin: 10.7 g/dL — ABNORMAL LOW (ref 12.0–15.0)
MCH: 26.5 pg (ref 26.0–34.0)
MCHC: 32.1 g/dL (ref 30.0–36.0)
MCV: 82.4 fL (ref 80.0–100.0)
Platelets: 425 10*3/uL — ABNORMAL HIGH (ref 150–400)
RBC: 4.04 MIL/uL (ref 3.87–5.11)
RDW: 17.1 % — ABNORMAL HIGH (ref 11.5–15.5)
WBC: 6.9 10*3/uL (ref 4.0–10.5)
nRBC: 0 % (ref 0.0–0.2)

## 2023-08-15 LAB — COMPREHENSIVE METABOLIC PANEL WITH GFR
ALT: 13 U/L (ref 0–44)
AST: 22 U/L (ref 15–41)
Albumin: 3.7 g/dL (ref 3.5–5.0)
Alkaline Phosphatase: 30 U/L — ABNORMAL LOW (ref 38–126)
Anion gap: 9 (ref 5–15)
BUN: 25 mg/dL — ABNORMAL HIGH (ref 8–23)
CO2: 24 mmol/L (ref 22–32)
Calcium: 9.8 mg/dL (ref 8.9–10.3)
Chloride: 103 mmol/L (ref 98–111)
Creatinine, Ser: 1.58 mg/dL — ABNORMAL HIGH (ref 0.44–1.00)
GFR, Estimated: 37 mL/min — ABNORMAL LOW (ref >60)
Glucose, Bld: 86 mg/dL (ref 70–99)
Potassium: 4.6 mmol/L (ref 3.5–5.1)
Sodium: 136 mmol/L (ref 135–145)
Total Bilirubin: 0.7 mg/dL (ref 0.0–1.2)
Total Protein: 6.4 g/dL — ABNORMAL LOW (ref 6.5–8.1)

## 2023-08-15 LAB — LIPID PANEL
Cholesterol: 145 mg/dL (ref 0–200)
HDL: 44 mg/dL (ref >40)
LDL Cholesterol: 73 mg/dL (ref 0–99)
Total CHOL/HDL Ratio: 3.3 ratio
Triglycerides: 140 mg/dL (ref <150)
VLDL: 28 mg/dL (ref 0–40)

## 2023-08-15 LAB — URINALYSIS, ROUTINE W REFLEX MICROSCOPIC
Bacteria, UA: NONE SEEN
Bilirubin Urine: NEGATIVE
Glucose, UA: >=500 mg/dL — AB
Hgb urine dipstick: NEGATIVE
Ketones, ur: NEGATIVE mg/dL
Leukocytes,Ua: NEGATIVE
Nitrite: NEGATIVE
Protein, ur: NEGATIVE mg/dL
Specific Gravity, Urine: 1.015 (ref 1.005–1.030)
pH: 5 (ref 5.0–8.0)

## 2023-08-15 LAB — TYPE AND SCREEN
ABO/RH(D): O POS
Antibody Screen: NEGATIVE

## 2023-08-15 LAB — PROTIME-INR
INR: 1 (ref 0.8–1.2)
Prothrombin Time: 13.5 s (ref 11.4–15.2)

## 2023-08-15 LAB — SURGICAL PCR SCREEN
MRSA, PCR: NEGATIVE
Staphylococcus aureus: NEGATIVE

## 2023-08-15 LAB — APTT: aPTT: 32 s (ref 24–36)

## 2023-08-15 MED ORDER — METOPROLOL SUCCINATE ER 25 MG PO TB24: 25.0000 mg | ORAL_TABLET | Freq: Every evening | ORAL | 3 refills | Status: DC

## 2023-08-15 MED ORDER — FUROSEMIDE 20 MG PO TABS: ORAL_TABLET | ORAL | 3 refills | Status: DC

## 2023-08-15 NOTE — Progress Notes (Signed)
 ADVANCED HF CLINIC NOTE   PCP: Billie Lade, MD HF Cardiology: Dr. Shirlee Latch  CC: CHF  Erin [name] is a 63 y.o. with history of CAD, ischemic cardiomyopathy, and PAD was referred by Dr. Darl Householder for evaluation of CHF.  She had NSTEMI in 11/17 with PCI to prox/mid LCx and RCA.  Subsequently, has developed ischemic cardiomyopathy.  Most recent echo in 1/21 showed EF 30-35%.  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   RHC/LHC done with exertional chest heaviness and dyspnea in 4/21 showed normal filling pressures, preserved cardiac output, and nonobstructive mild CAD.  ABIs were normal in 4/21.   In 8/21, she had syncope thought to be related to orthostasis from low BP in the setting of cardiac medication titration.  Entresto was decreased.   She was in the ER in 11/21 with chest pain.  Troponin and COVID-19 negative. Echo in 11/21 showed EF 30-35%, normal RV.   She was hospitalized in 6/22 with left-sided weakness/numbness.  No evidence for acute CVA, ?TIA.    Echo in 11/22 showed EF 30-35% with mild LV dilation, normal RV, mild-moderate MR, IVC normal.   Echo 2/24 showed EF remains 30-35% with normal RV.   Patient had left shoulder replacement in 11/24.   In 2/25, she was diagnosed with Stage Ia LUL lung adenocarcinoma.    Echo in 3/25 showed EF 35-40%, RV normal, IVC normal. Cardolite in 4/25 showed inferior/inferolateral infarction with no ischemia.   Patient returns for followup of CHF and CAD.  She has lost significant weight in the setting of her cancer diagnosis.  She has quit smoking.  She is short of breath walking up a flight of stairs but can make it to the top without much difficulty.  No dyspnea walking on flat ground.  No chest pain.  No orthopnea/PND.  Her cardiac meds have been cut back due to low BP and orthostasis.  Currently, no orthostatic symptoms.   Medtronic device interrogation (personally reviewed): Thoracic  impedance low.  No VT/AF.   REDS clip 18%  ECG (personally reviewed): NSR, poor R wave progression  PMH: 1. CAD: NSTEMI with DES to proximal and mid LCx in 11/17, staged DES x 2 to RCA later in 11/17.  - Cardiolite (10/20): EF < 30%, large inferior and inferolateral MI with mild peri-infarct ischemia.  - LHC (4/21): Patent stents, nonobstructive CAD.  - Lexiscan Cardiolite (4/25): EF 35%, inferior and inferolateral infarction with no ischemia.  2. Chronic systolic CHF: Ischemic cardiomyopathy. Medtronic ICD.  - Echo (10/20): EF 35-40%, lateral WMAs, normal RV. - Echo (1/21): EF 30-35%, mild LVH, normal RV - RHC (4/21): mean RA 5, PA 29/3, mean PCWP 12, CI 3.04 - Echo (11/21): EF 30-35%, normal RV.  - Echo (11/22): EF 30-35% with mild LV dilation, normal RV, mild-moderate MR, IVC normal.  - Echo (2/24): EF 30-35%, normal RV.  - Echo (3/25): EF 35-40%, RV normal, IVC normal 3. 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  4. Carotid stenosis: Carotid dopplers (10/20) with 60-79% LICA stenosis.  - Carotid dopplers (6/21): 40-59% BICA stenosis.  - Carotid dopplers (1/22): 40-59% RICA, 60-79% LICA.  - Carotid dopplers (4/23): 40-59% LICA - Carotid dopplers (12/23): 40-59% LICA - Carotid dopplers (12/24): 40-59% LICA stenosis 5. Prior smoker.  6. PAD: ABIs (4/21) Normal.  - Peripheral artery dopplers (12/21): bilateral plaque without focal stenosis.  7. Left shoulder replacement 11/24 8. Lung cancer: NSCLC (adenoCA),  stage Ia, diagnosed in 2/25.   Social History   Socioeconomic History   Marital status: Married    Spouse name: Not on file   Number of children: Not on file   Years of education: Not on file   Highest education level: Associate degree: occupational, Scientist, product/process development, or vocational program  Occupational History   Occupation: CNA  Tobacco Use   Smoking status: Former    Current packs/day: 1.50    Average packs/day: 1.5 packs/day for 40.0 years  (60.0 ttl pk-yrs)    Types: Cigarettes   Smokeless tobacco: Never   Tobacco comments:    Smokes off and on 06/14/23 [name], CMA     No cigarettes since Thursday this week>> 07/06/2023  Vaping Use   Vaping status: Never Used  Substance and Sexual Activity   Alcohol use: Not Currently    Comment: occasionally   Drug use: Not Currently    Types: Marijuana   Sexual activity: Not Currently    Birth control/protection: Surgical    Comment: hyst  Other Topics Concern   Not on file  Social History Narrative   Lives with husband in [place] in a one story home with a basement.  Has 4 children.  Works as a Lawyer.  Education: CNA school.    Social Drivers of Corporate investment banker Strain: Low Risk  (04/20/2023)   Overall Financial Resource Strain (CARDIA)    Difficulty of Paying Living Expenses: Not hard at all  Food Insecurity: No Food Insecurity (07/26/2023)   Hunger Vital Sign    Worried About Running Out of Food in the Last Year: Never true    Ran Out of Food in the Last Year: Never true  Transportation Needs: No Transportation Needs (07/26/2023)   PRAPARE - Administrator, Civil Service (Medical): No    Lack of Transportation (Non-Medical): No  Physical Activity: Insufficiently Active (04/20/2023)   Exercise Vital Sign    Days of Exercise per Week: 3 days    Minutes of Exercise per Session: 30 min  Stress: Stress Concern Present (04/20/2023)   [employer] of Occupational Health - Occupational Stress Questionnaire    Feeling of Stress : Very much  Social Connections: Socially Isolated (04/20/2023)   Social Connection and Isolation Panel [NHANES]    Frequency of Communication with Friends and Family: Once a week    Frequency of Social Gatherings with Friends and Family: Once a week    Attends Religious Services: Never    Database administrator or Organizations: No    Attends Engineer, structural: Not on file    Marital Status: Married  Careers information officer Violence: Not At Risk (07/26/2023)   Humiliation, Afraid, Rape, and Kick questionnaire    Fear of Current or Ex-Partner: No    Emotionally Abused: No    Physically Abused: No    Sexually Abused: No   Family History  Problem Relation Age of Onset   Stroke Mother    Hypertension Mother    Diabetes Mother    Heart attack Mother    Heart attack Father    Diabetes Father    Hypertension Father    CAD Father    Colon polyps Father 42       pre-cancerous    Stroke Father    Dementia Father    Hyperlipidemia Father    Arthritis Father    COPD Father        smoked and worked Pharmacologist  Heart disease Father    Breast cancer Maternal Grandmother    Diabetes Maternal Grandmother    Cancer Maternal Grandfather        Tongue and esophageal   Cancer Paternal Grandmother    Anxiety disorder Daughter    Depression Daughter    Anxiety disorder Daughter    Heart failure Other    Colon cancer Neg Hx    ROS: All systems reviewed and negative except as per HPI.   Current Outpatient Medications  Medication Sig Dispense Refill   aspirin EC 81 MG tablet Take 1 tablet (81 mg total) by mouth every evening. Okay to restart on 2/29/2025 (Patient taking differently: Take 81 mg by mouth daily. Okay to restart on 2/29/2025)     atorvastatin (LIPITOR) 80 MG tablet Take 1 tablet (80 mg total) by mouth daily. (Patient taking differently: Take 80 mg by mouth at bedtime.) 90 tablet 3   clopidogrel (PLAVIX) 75 MG tablet Take 1 tablet (75 mg total) by mouth daily. Okay to restart on 07/04/2023 (Patient taking differently: Take 75 mg by mouth at bedtime.)     dapagliflozin propanediol (FARXIGA) 10 MG TABS tablet Take 1 tablet (10 mg total) by mouth daily before breakfast. 30 tablet 11   Evolocumab (REPATHA SURECLICK) 140 MG/ML SOAJ Inject 140 mg into the skin every 14 (fourteen) days. 6 mL 3   fenofibrate (TRICOR) 145 MG tablet Take 1 tablet (145 mg total) by mouth daily. 90 tablet 3   gabapentin  (NEURONTIN) 300 MG capsule Take 1 capsule by mouth at bedtime 30 capsule 0   hydrOXYzine (VISTARIL) 25 MG capsule Take 1 capsule (25 mg total) by mouth every 8 (eight) hours as needed. 30 capsule 1   linaclotide (LINZESS) 145 MCG CAPS capsule Take 1 capsule (145 mcg total) by mouth daily before breakfast. (Patient taking differently: Take 145 mcg by mouth daily as needed (consipation).) 30 capsule 11   losartan (COZAAR) 25 MG tablet Take 0.5 tablets (12.5 mg total) by mouth at bedtime.     MAGNESIUM-OXIDE 400 (240 Mg) MG tablet Take 1 tablet by mouth daily.     Multiple Vitamins-Minerals (MULTIVITAMIN WITH MINERALS) tablet Take 1 tablet by mouth daily.     nicotine (NICODERM CQ - DOSED IN MG/24 HOURS) 21 mg/24hr patch Place 21 mg onto the skin daily.     nitroGLYCERIN (NITROSTAT) 0.4 MG SL tablet Place 1 tablet (0.4 mg total) under the tongue every 5 (five) minutes x 3 doses as needed for chest pain (if no relief after 2nd dose, proceed to the ED for an evaluation or call 911). 25 tablet 2   ondansetron (ZOFRAN) 8 MG tablet Take 1 tablet (8 mg total) by mouth every 8 (eight) hours as needed for nausea or vomiting. (Patient taking differently: Take 8 mg by mouth daily as needed for nausea or vomiting.) 90 tablet 3   pantoprazole (PROTONIX) 40 MG tablet Take 40 mg by mouth daily.     potassium chloride SA (KLOR-CON M) 20 MEQ tablet Take 2 tablets (40 mEq total) by mouth 2 (two) times daily. 120 tablet 6   rOPINIRole (REQUIP) 0.5 MG tablet Take 0.5 mg by mouth at bedtime.   0   sertraline (ZOLOFT) 50 MG tablet Take 1 tablet (50 mg total) by mouth daily. 30 tablet 3   spironolactone (ALDACTONE) 25 MG tablet Take 1 tablet (25 mg total) by mouth at bedtime. 30 tablet 3   traZODone (DESYREL) 50 MG tablet Take 1 tablet (50  mg total) by mouth at bedtime. (Patient taking differently: Take 50 mg by mouth at bedtime as needed for sleep.) 90 tablet 1   Vitamin D, Ergocalciferol, (DRISDOL) 1.25 MG (50000 UNIT) CAPS  capsule Take 50,000 Units by mouth every 7 (seven) days. Mondays     EPINEPHrine (EPIPEN 2-PAK) 0.3 mg/0.3 mL IJ SOAJ injection Inject 0.3 mg into the muscle as needed for anaphylaxis. (Patient not taking: Reported on 08/15/2023) 2 each 2   furosemide (LASIX) 20 MG tablet Take 40 mg in am and 20 mg in pm 270 tablet 3   metoprolol succinate (TOPROL XL) 25 MG 24 hr tablet Take 1 tablet (25 mg total) by mouth at bedtime. 90 tablet 3   No current facility-administered medications for this encounter.   Wt Readings from Last 3 Encounters:  08/15/23 49.1 kg (108 lb 3.2 oz)  08/15/23 48.5 kg (107 lb)  08/14/23 49.9 kg (110 lb)   BP 114/80   Pulse 70   Ht 5' (1.524 m)   Wt 49.1 kg (108 lb 3.2 oz)   SpO2 100%   BMI 21.13 kg/m   General: NAD, thin Neck: No JVD, no thyromegaly or thyroid nodule.  Lungs: Clear to auscultation bilaterally with normal respiratory effort. CV: Nondisplaced PMI.  Heart regular S1/S2, no S3/S4, no murmur.  No peripheral edema.  No carotid bruit.  Normal pedal pulses.  Abdomen: Soft, nontender, no hepatosplenomegaly, no distention.  Skin: Intact without lesions or rashes.  Neurologic: Alert and oriented x 3.  Psych: Normal affect. Extremities: No clubbing or cyanosis.  HEENT: Normal.   Assessment/Plan: 1. CAD: S/p NSTEMI in 11/17 with DEX to LCx and DES x 2 to RCA.  Cardiolite in 10/20 with EF <30%, inferior/inferolateral infarct with peri-infarct ischemia. LHC in 4/21 showed nonobstructive mild CAD.  Cardiolite in 4/25 was similar with inferior and inferolateral infarction, no ischemia.  No chest pain.  - Continue atorvastatin + fenofibrate + Repatha. Check lipids today.  - She has been on ASA 81 and Plavix 75 mg daily.  These are on hold for 5 days prior to lung surgery.  2. Chronic systolic CHF: Ischemic cardiomyopathy.  RHC in 4/21 with normal filling pressures and preserved cardiac output.  Echo 2/24 showed stable EF 30-35%.  Echo in 3/25 showed EF 35-40%, normal  RV.  NYHA class II, stable.  She is not volume overloaded by exam or REDS clip.  Optivol suggests volume overload but I think this is inaccurate and likely needs to be reset since she has lost considerable weight.  - Decrease Lasix to 40 qam/20 qpm. Decrease KCl to 40 daily.   - Continue Farixga 10 mg daily.   - Continue losartan 12.5 mg at bedtime (unable to take Entresto in past due to hypotension).  - Increase Toprol XL to 25 mg at bedtime.  - Continue spironolactone 25 mg at bedtime.  - Candidate for CCM. Will have her assessed for this by EP after lung cancer treatment.  3. Carotid stenosis: Carotid dopplers 12/24 showed 40-59% LICA stenosis - Repeat in 1 year 4. Smoking: She is staying off cigarettes.  - Titrate off nicotine patch.  5. 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  - Continue Plavix per Dr. Corliss Skains (on hold for lung cancer surgery).  6. Lung cancer: Non-small cell lung CA, adenocarcinoma, diagnosed 2/25. Plan for robot assisted thoracoscopy and lobectomy by Dr. Cliffton Asters.  - I think she is a reasonable candidate for surgery.  Moderately increased risk due  to cardiomyopathy.  She is NYHA class II and not volume overloaded. No ischemic symptoms and recent Cardiolite showed old infarction, no ischemia.  She can hold Plavix, Farxiga, and ASA as requested prior to surgery, restart afterwards.   Follow up in 3 months with APP   I spent 41 minutes reviewing records, interviewing/examining patient, and managing orders.   Marca Ancona  08/15/2023

## 2023-08-15 NOTE — Progress Notes (Signed)
 ReDS Vest / Clip - 08/15/23 1100       ReDS Vest / Clip   Station Marker A    Ruler Value 23    ReDS Value Range Low volume    ReDS Actual Value 18

## 2023-08-15 NOTE — Patient Instructions (Addendum)
 Increase Toprol to 25 mg at bedtime. Updated Rx sent.  Decrease Lasix to 40 mg in am and 20 mg in pm - updated Rx sent. Labs today - will call you if abnormal. Return to Heart Failure APP Clinic in 3 months - see below. Please call us at [phone] if any questions or concerns prior to next visit.

## 2023-08-15 NOTE — Progress Notes (Addendum)
 PCP - Billie Lade, MD  Cardiologist - Marca Ancona, MD   PPM/ICD - Meditronic Device Orders - yes Rep Notified - yes  Chest Ct- 04-28-23 PFTs- 08-09-23 Chest x-ray -  EKG - 08-15-23 Stress Test - 08-14-23 ECHO - 08-03-23 Cardiac Cath - 08-19-19  Sleep Study - denies CPAP -   DM- denies  Blood Thinner Instructions: plavix last dose 08-12-23 Aspirin Instructions: last dose 08-14-23  ERAS Protcol -NPO    COVID TEST-    Anesthesia review: yes, will be seeing cardiology MD today for clearance  Patient denies shortness of breath, fever, cough and chest pain at PAT appointment   All instructions explained to the patient, with a verbal understanding of the material. Patient agrees to go over the instructions while at home for a better understanding. Patient also instructed to self quarantine after being tested for COVID-19. The opportunity to ask questions was provided.

## 2023-08-16 ENCOUNTER — Telehealth (HOSPITAL_COMMUNITY): Payer: Self-pay | Admitting: *Deleted

## 2023-08-16 ENCOUNTER — Ambulatory Visit (INDEPENDENT_AMBULATORY_CARE_PROVIDER_SITE_OTHER): Payer: Medicaid Other

## 2023-08-16 DIAGNOSIS — I5042 Chronic combined systolic (congestive) and diastolic (congestive) heart failure: Secondary | ICD-10-CM

## 2023-08-16 DIAGNOSIS — I255 Ischemic cardiomyopathy: Secondary | ICD-10-CM

## 2023-08-16 DIAGNOSIS — E782 Mixed hyperlipidemia: Secondary | ICD-10-CM

## 2023-08-16 LAB — CUP PACEART REMOTE DEVICE CHECK
Battery Remaining Longevity: 106 mo
Battery Voltage: 3.01 V
Brady Statistic RV Percent Paced: 0.01 %
Date Time Interrogation Session: 20250409033323
HighPow Impedance: 75 Ohm
Implantable Lead Connection Status: 753985
Implantable Lead Implant Date: 20211126
Implantable Lead Location: 753860
Implantable Pulse Generator Implant Date: 20211126
Lead Channel Impedance Value: 494 Ohm
Lead Channel Impedance Value: 570 Ohm
Lead Channel Pacing Threshold Amplitude: 0.625 V
Lead Channel Pacing Threshold Pulse Width: 0.4 ms
Lead Channel Sensing Intrinsic Amplitude: 4.75 mV
Lead Channel Sensing Intrinsic Amplitude: 4.75 mV
Lead Channel Setting Pacing Amplitude: 2 V
Lead Channel Setting Pacing Pulse Width: 0.4 ms
Lead Channel Setting Sensing Sensitivity: 0.3 mV
Zone Setting Status: 755011
Zone Setting Status: 755011

## 2023-08-16 MED ORDER — EZETIMIBE 10 MG PO TABS: 10.0000 mg | ORAL_TABLET | Freq: Every day | ORAL | 3 refills | Status: AC

## 2023-08-16 NOTE — Anesthesia Preprocedure Evaluation (Addendum)
 Anesthesia Evaluation  Patient identified by MRN, date of birth, ID band Patient awake    Reviewed: Allergy & Precautions, NPO status , Patient's Chart, lab work & pertinent test results, reviewed documented beta blocker date and time   Airway Mallampati: III  TM Distance: >3 FB Neck ROM: Full    Dental  (+) Missing, Edentulous Upper   Pulmonary neg COPD, Patient abstained from smoking., former smoker LUL mass  PFTs 08/09/2023: FVC 2.37 (84%), post 2.43 (86%). FEV1 1.54 (71%), post 1.81 (83%), DLCO unc 9.83 (56%), cor 10.30 (58%).   Pulmonary exam normal        Cardiovascular hypertension, Pt. on medications and Pt. on home beta blockers + CAD, + Past MI, + Cardiac Stents and +CHF  Normal cardiovascular exam+ Cardiac Defibrillator  Rhythm:Regular Rate:Normal  ECHO:  1. Left ventricular ejection fraction, by estimation, is 30 to 35%. The  left ventricle has moderately decreased function. The left ventricle  demonstrates regional wall motion abnormalities with basal to mid  inferolateral and anterolateral akinesis.  There is mild concentric left ventricular hypertrophy. Left ventricular  diastolic parameters are consistent with Grade I diastolic dysfunction  (impaired relaxation).   2. Right ventricular systolic function is normal. The right ventricular  size is normal. There is normal pulmonary artery systolic pressure. The  estimated right ventricular systolic pressure is 28.4 mmHg.   3. The mitral valve is normal in structure. Mild mitral valve  regurgitation. No evidence of mitral stenosis.   4. The aortic valve is tricuspid. Aortic valve regurgitation is not  visualized. No aortic stenosis is present.   5. The inferior vena cava is normal in size with greater than 50%  respiratory variability, suggesting right atrial pressure of 3 mmHg.     Neuro/Psych  PSYCHIATRIC DISORDERS Anxiety Depression     Neuromuscular disease     GI/Hepatic Neg liver ROS,GERD  Medicated and Controlled,,  Endo/Other  negative endocrine ROS    Renal/GU CRFRenal disease     Musculoskeletal  (+) Arthritis ,    Abdominal   Peds  Hematology  (+) Blood dyscrasia (Plavix ), anemia   Anesthesia Other Findings   Reproductive/Obstetrics                              Anesthesia Physical Anesthesia Plan  ASA: 4  Anesthesia Plan: General   Post-op Pain Management: Tylenol  PO (pre-op)*   Induction: Intravenous  PONV Risk Score and Plan: 2 and Ondansetron , Dexamethasone , Midazolam  and Treatment may vary due to age or medical condition  Airway Management Planned: Double Lumen EBT  Additional Equipment: Arterial line  Intra-op Plan:   Post-operative Plan: Extubation in OR  Informed Consent: I have reviewed the patients History and Physical, chart, labs and discussed the procedure including the risks, benefits and alternatives for the proposed anesthesia with the patient or authorized representative who has indicated his/her understanding and acceptance.     Dental advisory given  Plan Discussed with: CRNA  Anesthesia Plan Comments: (PAT note written 08/30/2023 by Adiyah Lame, PA-C.   )        Anesthesia Quick Evaluation

## 2023-08-16 NOTE — Telephone Encounter (Signed)
 Called patient per Dr. Shirlee Latch with following lab results and instructions:  "Make sure she is taking atorvastatin and Repatha regularly.  Goal LDL < 55, so if she is taking the other meds, would add Zetia 10 mg daily with lipids in 2 months."  Pt confirmed she is taking both atorvastatin and Repatha regularly. Rx sent for Zetia; next HF appointment note updated requesting lipid panel at that visit.

## 2023-08-16 NOTE — Progress Notes (Signed)
 Anesthesia Chart Review:  Case: [id number] Date/Time: 08/17/23 0745   Procedure: LOBECTOMY, LUNG, ROBOT-ASSISTED, USING VATS (Left: Chest) - Left Robotic Assisted Thoracoscopy for Left Upper Lobectomy   Anesthesia type: General   Diagnosis: Malignant neoplasm of upper lobe bronchus, left (HCC) [C34.12]   Pre-op diagnosis: lung cancer   [facility] / [facility]   Surgeons: Corliss Skains, MD       DISCUSSION: Patient is a 63 year old female scheduled for the above procedure.  History includes former smoker (60 pack years), smoking, HTN, HLD, CAD (NSTEMI, s/p DES prox-mid CX 03/11/16, DES RCA x2 03/14/16), ischemic cardiomyopathy, chronic combined systolic and diastolic CHF, ICD (Medtronic [id number] Visia AF MRI VR ICD 04/03/20), CKD, GERD, cerebral aneurysm, thrombocytosis, lung cancer (LUL biops: adenocarcinoma 07/03/23), skin cancer (SCC, nose), alpha gal allergy, anemia, osteoarthritis (left reverse TSA 03/21/23).   She had HF cardiology follow-up with Dr. Shirlee Latch on 08/15/2023. "I think she is a reasonable candidate for surgery. Moderately increased risk due to cardiomyopathy. She is NYHA class II and not volume overloaded. No ischemic symptoms and recent Cardiolite showed old infarction, no ischemia. She can hold Plavix, Farxiga, and ASA as requested prior to surgery, restart afterwards." See recent testing below.  Perioperative EP Recommendations for ICD: Device Information: Clinic EP Physician:  Loman Brooklyn, MD  Device Type:  Defibrillator Manufacturer and Phone #:  Medtronic: [phone] Pacemaker Dependent?:  No. Date of Last Device Check:  07/18/23         Normal Device Function?:  Yes.     Electrophysiologist's Recommendations: Have magnet available. Provide continuous ECG monitoring when magnet is used or reprogramming is to be performed.  Procedure will likely interfere with device function.  Device should be programmed:  Tachy therapies disabled   Anesthesia team  to evaluate on the day of surgery.  Last Plavix was 08/12/2023, last aspirin for 08/14/2023.   VS: BP 106/68   Temp 36.6 C (Temporal)   Resp 18   Ht 5' (1.524 m)   Wt 48.5 kg   SpO2 100%   BMI 20.90 kg/m   PROVIDERS: Billie Lade, MD is PCP  Marca Ancona, MD is HF cardiologist   LABS: Preoperative labs noted. Cr 1.58, overall appears within range over the past four months. HGB 10.7, previously ~ 11-12.  (all labs ordered are listed, but only abnormal results are displayed)  Labs Reviewed  CBC - Abnormal; Notable for the following components:      Result Value   Hemoglobin 10.7 (*)    HCT 33.3 (*)    RDW 17.1 (*)    Platelets 425 (*)    All other components within normal limits  COMPREHENSIVE METABOLIC PANEL WITH GFR - Abnormal; Notable for the following components:   BUN 25 (*)    Creatinine, Ser 1.58 (*)    Total Protein 6.4 (*)    Alkaline Phosphatase 30 (*)    GFR, Estimated 37 (*)    All other components within normal limits  URINALYSIS, ROUTINE W REFLEX MICROSCOPIC - Abnormal; Notable for the following components:   Glucose, UA >=500 (*)    All other components within normal limits  SURGICAL PCR SCREEN  PROTIME-INR  APTT  TYPE AND SCREEN    PFTs 08/09/2023: FVC 2.37 (84%), post 2.43 (86%). FEV1 1.54 (71%), post 1.81 (83%), DLCO unc 9.83 (56%), cor 10.30 (58%).   IMAGES: CXR 08/15/2023: In process.   PET Scan 07/20/2023: IMPRESSION: - Ground-glass like area at the left  lung apex is again seen and similar to previous CT scan. There is now fiduciary marker in this location. No specific abnormal radiotracer uptake. Please correlate with results from prior intervention. Otherwise based on today's examination, simple follow up is recommended in 3 months with CT. - Slight asymmetric area of uptake along the anorectal region. This could be a physiologic variant but please correlate with clinical findings to exclude lesion. - Incidental findings identified  including bilateral adrenal adenomas. - 3.1 cm infrarenal abdominal aortic aneurysm. Recommend follow-up ultrasound every 3 years. (Ref.: J Vasc Surg. 2018; 67:2-77 and J Am Coll Radiol 2013;10(10):789-794.)   CT Abd/pelvis 06/18/23: IMPRESSION: 1. No acute findings in the abdomen or pelvis. Specifically, no findings to explain the patient's history of intermittent abdominal pain with vomiting. 2. Stable bilateral adrenal nodules, left greater than right and previously characterized as adenomas (MRI 01/06/2023). 3. Stable tiny hypoattenuating lesions in the liver, previously characterized as benign adrenal adenomas on MRI abdomen 01/06/2023 and stable since that time. 4. Juxtarenal 3.2 cm abdominal aortic aneurysm with accessory renal arteries noted bilaterally. 5.  Aortic Atherosclerosis (ICD10-I70.0).    EKG: 08/15/2023: Normal sinus rhythm Possible Anterior infarct , age undetermined Abnormal ECG   CV: Nuclear stress tet 08/14/2023:   Findings are consistent with infarction. The study is high risk.   No ST deviation was noted.   LV perfusion is abnormal. There is no evidence of ischemia. There is evidence of infarction. Defect 1: There is a medium defect with severe reduction in uptake present in the apical to basal inferior and inferolateral location(s) that is fixed. There is abnormal wall motion in the defect area. Consistent with infarction.   Left ventricular function is abnormal. Nuclear stress EF: 35%. The left ventricular ejection fraction is moderately decreased (30-44%). End diastolic cavity size is mildly enlarged.   Prior study available for comparison from 03/07/2019.   Severe fixed perfusion defect in apical to basal inferior/inferolateral walls, with hypokinesis in this region consistent with prior infarct LVEF 35% High risk study due to systolic dysfunction and large fixed perfusion defect.  No ischemia    TTE 08/03/2023: IMPRESSIONS   1. Left ventricular ejection  fraction, by estimation, is 35 to 40%. The  left ventricle has moderately decreased function. The left ventricle  demonstrates global hypokinesis. Left ventricular diastolic parameters are  consistent with Grade I diastolic  dysfunction (impaired relaxation). The average left ventricular global  longitudinal strain is -12.3 %. The global longitudinal strain is  abnormal.   2. Right ventricular systolic function is normal. The right ventricular  size is normal. There is normal pulmonary artery systolic pressure. The  estimated right ventricular systolic pressure is 23.8 mmHg.   3. Left atrial size was mildly dilated.   4. The mitral valve is normal in structure. Trivial mitral valve  regurgitation. No evidence of mitral stenosis.   5. The aortic valve is tricuspid. There is mild calcification of the  aortic valve. Aortic valve regurgitation is not visualized. No aortic  stenosis is present.   6. The inferior vena cava is normal in size with greater than 50%  respiratory variability, suggesting right atrial pressure of 3 mmHg.   7. A small pericardial effusion is present.  - Comparison 06/13/22: LVEF 30-35%, basal to mid  inferolateral and anterolateral akinesis, grade 1 DD, RVSP 28.4 mmHg, mild MR   Carotid duplex 04/28/2023: Summary:  - Right Carotid: Velocities in the right ICA are consistent with a 1-39% stenosis.  - Left  Carotid: Velocities in the left ICA are consistent with a 40-59% stenosis.  Cannot rule out higher stenosis due to circumferential shadowing plaque in the proximal LICA.  - Vertebrals:  Bilateral vertebral arteries demonstrate antegrade flow.  - Subclavians: Normal flow hemodynamics were seen in bilateral subclavian arteries.     Cath 08/19/2019: 1. Normal filling pressures.  2. Preserved cardiac output.  3. No obstructive CAD, patent stents.  No explanation for chest pain, ?vasospasm.    Cardiac event monitor 03/24/2017-04/22/2017: Sinus rhythm, sinus  tachycardia, and PVCs seen. Symptoms correlated with all of the above.     Past Medical History:  Diagnosis Date   AICD (automatic cardioverter/defibrillator) present    Allergic reaction to alpha-gal    Allergy 03/05/2022   Anemia    Anxiety state 03/25/2016   Arthritis    Basal cell carcinoma of forehead    Brain aneurysm    CHF (congestive heart failure) (HCC)    Coronary artery disease    a. 03/11/16 PCI with DES-->Prox/Mid Cx;  b. 03/14/16 PCI with DES x2-->RCA, EF 30-35%.   Depression 06/09/2010   Encounter for general adult medical examination with abnormal findings 05/04/2022   Essential hypertension    Hx   GERD (gastroesophageal reflux disease)    HFrEF (heart failure with reduced ejection fraction) (HCC)    a. 10/2016 Echo: EF 35-40%, Gr1 DD, mild focal basal septal hypertrophy, basal inflat, mid inflat, basal antlat AK. Mid infept/inf/antlat, apical lateral sev HK. Mod MR. mildly reduced RV fxn. Mild TR.   History of pneumonia    Hyperlipidemia    IBS (irritable bowel syndrome)    Ischemic cardiomyopathy    a. 10/2016 Echo: EF 35-40%, Gr1 DD.   Lung cancer [facility])    Mitral regurgitation    Neuromuscular disorder (HCC)    NSTEMI (non-ST elevated myocardial infarction) (HCC) 03/10/2016   Pneumonia 03/2016   Shingles    Squamous cell cancer of skin of nose    Thrombocytosis 03/26/2016   Tobacco abuse    Trichimoniasis    Wears dentures    Wears glasses     Past Surgical History:  Procedure Laterality Date   APPENDECTOMY     BICEPT TENODESIS Left 03/21/2023   Procedure: BICEPS TENODESIS;  Surgeon: Cammy Copa, MD;  Location: [facility] OR;  Service: Orthopedics;  Laterality: Left;   BIOPSY  09/20/2018   Procedure: BIOPSY;  Surgeon: Corbin Ade, MD;  [facility];  Service: Endoscopy;;  colon   BIOPSY  01/05/2021   Procedure: BIOPSY;  Surgeon: Rachael Fee, MD;  Location: [facility] ENDOSCOPY;  Service: Endoscopy;;   BIOPSY  04/27/2022   Procedure:  BIOPSY;  Surgeon: Lanelle Bal, DO;  [facility];  Service: Endoscopy;;   BRONCHIAL BIOPSY  07/03/2023   Procedure: BRONCHIAL BIOPSIES;  Surgeon: Leslye Peer, MD;  Location: [facility] ENDOSCOPY;  Service: Pulmonary;;   BRONCHIAL BRUSHINGS  07/03/2023   Procedure: BRONCHIAL BRUSHINGS;  Surgeon: Leslye Peer, MD;  Location: [facility] ENDOSCOPY;  Service: Pulmonary;;   BRONCHIAL NEEDLE ASPIRATION BIOPSY  07/03/2023   Procedure: BRONCHIAL NEEDLE ASPIRATION BIOPSIES;  Surgeon: Leslye Peer, MD;  Location: [facility] ENDOSCOPY;  Service: Pulmonary;;   BRONCHIAL WASHINGS  07/03/2023   Procedure: BRONCHIAL WASHINGS;  Surgeon: Leslye Peer, MD;  Location: [facility] ENDOSCOPY;  Service: Pulmonary;;   CARDIAC CATHETERIZATION N/A 03/11/2016   Procedure: Left Heart Cath and Coronary Angiography;  Surgeon: Marykay Lex, MD;  Location: [facility] INVASIVE CV LAB;  Service: Cardiovascular;  Laterality: N/A;   CARDIAC CATHETERIZATION N/A 03/11/2016   Procedure: Coronary Stent Intervention;  Surgeon: Marykay Lex, MD;  Location: [facility] INVASIVE CV LAB;  Service: Cardiovascular;  Laterality: N/A;   CARDIAC CATHETERIZATION N/A 03/14/2016   Procedure: Coronary Stent Intervention;  Surgeon: Peter M Swaziland, MD;  Location: [facility] INVASIVE CV LAB;  Service: Cardiovascular;  Laterality: N/A;   CEREBRAL ANEURYSM REPAIR  04/2019   stent placed   CHOLECYSTECTOMY OPEN  1984   COLONOSCOPY WITH PROPOFOL N/A 09/20/2018   Procedure: COLONOSCOPY WITH PROPOFOL;  Surgeon: Corbin Ade, MD;  [facility];  Service: Endoscopy;  Laterality: N/A;  10:30am   CORONARY ANGIOPLASTY WITH STENT PLACEMENT  03/14/2016   ESOPHAGOGASTRODUODENOSCOPY (EGD) WITH PROPOFOL N/A 09/20/2018   Procedure: ESOPHAGOGASTRODUODENOSCOPY (EGD) WITH PROPOFOL;  Surgeon: Corbin Ade, MD;  [facility];  Service: Endoscopy;  Laterality: N/A;   ESOPHAGOGASTRODUODENOSCOPY (EGD) WITH PROPOFOL N/A 01/05/2021   Procedure:  ESOPHAGOGASTRODUODENOSCOPY (EGD) WITH PROPOFOL;  Surgeon: Rachael Fee, MD;  Location: [facility] ENDOSCOPY;  Service: Endoscopy;  Laterality: N/A;   ESOPHAGOGASTRODUODENOSCOPY (EGD) WITH PROPOFOL N/A 04/27/2022   Procedure: ESOPHAGOGASTRODUODENOSCOPY (EGD) WITH PROPOFOL;  Surgeon: Lanelle Bal, DO;  [facility];  Service: Endoscopy;  Laterality: N/A;  9:15am, asa 3/4, ASAP   ESOPHAGOGASTRODUODENOSCOPY (EGD) WITH PROPOFOL N/A 01/04/2023   Procedure: ESOPHAGOGASTRODUODENOSCOPY (EGD) WITH PROPOFOL;  Surgeon: Corbin Ade, MD;  [facility];  Service: Endoscopy;  Laterality: N/A;  130pm, asa 3   FIDUCIAL MARKER PLACEMENT  07/03/2023   Procedure: FIDUCIAL MARKER PLACEMENT;  Surgeon: Leslye Peer, MD;  Location: [facility] ENDOSCOPY;  Service: Pulmonary;;   FINGER ARTHROPLASTY Left 05/14/2013   Procedure: LEFT THUMB CARPAL METACARPAL ARTHROPLASTY;  Surgeon: Tami Ribas, MD;  Location: [facility];  Service: Orthopedics;  Laterality: Left;   ICD IMPLANT N/A 04/03/2020   Procedure: ICD IMPLANT;  Surgeon: Regan Lemming, MD;  Location: [facility] INVASIVE CV LAB;  Service: Cardiovascular;  Laterality: N/A;   IR ANGIO INTRA EXTRACRAN SEL COM CAROTID INNOMINATE BILAT MOD SED  01/05/2017   IR ANGIO INTRA EXTRACRAN SEL COM CAROTID INNOMINATE BILAT MOD SED  03/19/2019   IR ANGIO INTRA EXTRACRAN SEL COM CAROTID INNOMINATE BILAT MOD SED  06/04/2020   IR ANGIO INTRA EXTRACRAN SEL INTERNAL CAROTID UNI L MOD SED  04/15/2019   IR ANGIO VERTEBRAL SEL VERTEBRAL BILAT MOD SED  01/05/2017   IR ANGIO VERTEBRAL SEL VERTEBRAL BILAT MOD SED  03/19/2019   IR ANGIO VERTEBRAL SEL VERTEBRAL UNI L MOD SED  06/04/2020   IR ANGIOGRAM FOLLOW UP STUDY  04/15/2019   IR RADIOLOGIST EVAL & MGMT  12/30/2016   IR TRANSCATH/EMBOLIZ  04/15/2019   IR US GUIDE VASC ACCESS RIGHT  03/19/2019   IR US GUIDE VASC ACCESS RIGHT  06/04/2020   JOINT REPLACEMENT     MALONEY DILATION N/A 09/20/2018   Procedure:  Elease Hashimoto DILATION;  Surgeon: Corbin Ade, MD;  [facility];  Service: Endoscopy;  Laterality: N/A;   MALONEY DILATION N/A 01/04/2023   Procedure: Elease Hashimoto DILATION;  Surgeon: Corbin Ade, MD;  [facility];  Service: Endoscopy;  Laterality: N/A;   RADIOLOGY WITH ANESTHESIA N/A 04/15/2019   Procedure: Sharman Crate;  Surgeon: Julieanne Cotton, MD;  Location: [facility] OR;  Service: Radiology;  Laterality: N/A;   REVERSE SHOULDER ARTHROPLASTY Left 03/21/2023   Procedure: LEFT REVERSE SHOULDER ARTHROPLASTY;  Surgeon: Cammy Copa, MD;  Location: [facility]  OR;  Service: Orthopedics;  Laterality: Left;   RIGHT/LEFT HEART CATH AND CORONARY ANGIOGRAPHY N/A 08/19/2019   Procedure: RIGHT/LEFT HEART CATH AND CORONARY ANGIOGRAPHY;  Surgeon: Laurey Morale, MD;  Location: [facility] INVASIVE CV LAB;  Service: Cardiovascular;  Laterality: N/A;   TUBAL LIGATION  1987   VAGINAL HYSTERECTOMY  2009    MEDICATIONS:  aspirin EC 81 MG tablet   atorvastatin (LIPITOR) 80 MG tablet   clopidogrel (PLAVIX) 75 MG tablet   dapagliflozin propanediol (FARXIGA) 10 MG TABS tablet   EPINEPHrine (EPIPEN 2-PAK) 0.3 mg/0.3 mL IJ SOAJ injection   Evolocumab (REPATHA SURECLICK) 140 MG/ML SOAJ   fenofibrate (TRICOR) 145 MG tablet   furosemide (LASIX) 20 MG tablet   gabapentin (NEURONTIN) 300 MG capsule   hydrOXYzine (VISTARIL) 25 MG capsule   linaclotide (LINZESS) 145 MCG CAPS capsule   losartan (COZAAR) 25 MG tablet   MAGNESIUM-OXIDE 400 (240 Mg) MG tablet   metoprolol succinate (TOPROL XL) 25 MG 24 hr tablet   Multiple Vitamins-Minerals (MULTIVITAMIN WITH MINERALS) tablet   nicotine (NICODERM CQ - DOSED IN MG/24 HOURS) 21 mg/24hr patch   nitroGLYCERIN (NITROSTAT) 0.4 MG SL tablet   ondansetron (ZOFRAN) 8 MG tablet   pantoprazole (PROTONIX) 40 MG tablet   potassium chloride SA (KLOR-CON M) 20 MEQ tablet   rOPINIRole (REQUIP) 0.5 MG tablet   sertraline (ZOLOFT) 50 MG tablet   spironolactone (ALDACTONE)  25 MG tablet   traZODone (DESYREL) 50 MG tablet   Vitamin D, Ergocalciferol, (DRISDOL) 1.25 MG (50000 UNIT) CAPS capsule   No current facility-administered medications for this encounter.    Shonna Chock, PA-C Surgical Short Stay/Anesthesiology [facility] Phone [phone] [facility] Phone [phone] 08/16/2023 9:50 AM

## 2023-08-17 ENCOUNTER — Encounter: Payer: Self-pay | Admitting: *Deleted

## 2023-08-17 ENCOUNTER — Other Ambulatory Visit: Payer: Self-pay | Admitting: *Deleted

## 2023-08-17 DIAGNOSIS — C3412 Malignant neoplasm of upper lobe, left bronchus or lung: Secondary | ICD-10-CM

## 2023-08-17 LAB — ACID FAST CULTURE WITH REFLEXED SENSITIVITIES (MYCOBACTERIA): Acid Fast Culture: NEGATIVE

## 2023-08-17 NOTE — Telephone Encounter (Signed)
 Verlon Au, please advise if we should wait until after patient lobectomy or do it prior?

## 2023-08-17 NOTE — Progress Notes (Signed)
 Pt called and is aware of surgery cancellation.

## 2023-08-18 ENCOUNTER — Other Ambulatory Visit: Payer: Self-pay | Admitting: Family Medicine

## 2023-08-18 DIAGNOSIS — F410 Panic disorder [episodic paroxysmal anxiety] without agoraphobia: Secondary | ICD-10-CM

## 2023-08-21 ENCOUNTER — Encounter: Payer: Self-pay | Admitting: Internal Medicine

## 2023-08-21 ENCOUNTER — Ambulatory Visit (INDEPENDENT_AMBULATORY_CARE_PROVIDER_SITE_OTHER): Admitting: Dermatology

## 2023-08-21 ENCOUNTER — Encounter: Payer: Self-pay | Admitting: Dermatology

## 2023-08-21 VITALS — BP 118/78 | HR 84

## 2023-08-21 DIAGNOSIS — L905 Scar conditions and fibrosis of skin: Secondary | ICD-10-CM | POA: Diagnosis not present

## 2023-08-21 DIAGNOSIS — L539 Erythematous condition, unspecified: Secondary | ICD-10-CM

## 2023-08-21 DIAGNOSIS — Z85828 Personal history of other malignant neoplasm of skin: Secondary | ICD-10-CM

## 2023-08-21 DIAGNOSIS — C4491 Basal cell carcinoma of skin, unspecified: Secondary | ICD-10-CM

## 2023-08-21 MED ORDER — PEG 3350-KCL-NA BICARB-NACL 420 G PO SOLR
4000.0000 mL | Freq: Once | ORAL | 0 refills | Status: AC
Start: 2023-08-21 — End: 2023-08-21

## 2023-08-21 NOTE — Telephone Encounter (Signed)
 PA approved via cohere Authorization #[id number]  DOS: 08/28/2023 - 10/28/2023

## 2023-08-21 NOTE — Assessment & Plan Note (Signed)
 Recently seen by pulmonology for follow-up in the setting of left upper lobe groundglass nodule.  Underwent bronchoscopy with biopsy 2/24 with pathology revealing adenocarcinoma.  She has been referred to oncology.  PET scan and MRI brain are pending.  Patient is aware of these results and understands the next steps in treatment.

## 2023-08-21 NOTE — Assessment & Plan Note (Signed)
 Chronic issue.  Recently seen by GI for follow-up in the setting of chronic nausea with unintentional weight loss.  Her weight today is 104 lbs, down from 109 lbs on 1/23.  She is currently prescribed pantoprazole and Zofran for symptom relief.  Reglan recently discontinued due to adverse side effects.  CTA of the abdomen to rule out mesenteric ischemia is pending.

## 2023-08-21 NOTE — Assessment & Plan Note (Signed)
 Followed by the advanced heart failure team (Dr. Mitzie Anda).  S/p ICD placement.  Recently seen for follow-up.  Diuretic regimen adjusted.  She appears euvolemic on exam today.  Repeat TTE pending.

## 2023-08-21 NOTE — Patient Instructions (Addendum)
 Post-Operative Scar Care: Education and Recommendations  Following your procedure, it's important to care for your scar to promote optimal healing and minimize its appearance. Proper post-operative care can help ensure that the scar heals well, and with time, it may become less noticeable. Below are key recommendations for scar care, including scar massage and the use of silicone scar gels or sheets.  1. General Scar Care Tips: -  Keep the wound clean and dry: Follow your healthcare provider's instructions for wound care, including cleaning the site and changing dressings as needed. -  Avoid sun exposure: Direct sunlight can darken scars and make them more noticeable. Once your wound has healed, apply sunscreen (SPF 30 or higher) to protect the scar from UV rays.  2. Scar Massage: - Start after healing: Wait until the scar has fully healed, with no scabs or open areas (usually 4-6 weeks after surgery). Your healthcare provider will give you specific guidance on when to begin. - Technique: Gently massage the scar in a circular motion for 5-10 minutes, 2-3 times per day. This helps to soften the tissue, reduce swelling, and improve the overall appearance of the scar. - Pressure: Apply gentle, firm pressure during the massage to break down the dense tissue that may form during healing. This helps to prevent the formation of keloids or hypertrophic scars. - Use lotion or ointment: Consider using a mild, fragrance-free lotion or vitamin E ointment to help lubricate the area during massage.  3. Silicone Scar Gels or Sheets: - When to start: Once your wound has healed completely, typically around 4-6 weeks, you can begin using silicone-based scar gels or sheets. These have been shown to improve scar appearance by hydrating the tissue and reducing inflammation. - How to use silicone gels: Apply a thin layer of the gel to the scar and allow it to dry before covering with clothing. You can use the  gel multiple times a day, depending on your provider's recommendation. - How to use silicone sheets: Cut the sheet to fit the size of your scar, and apply it directly to the healed scar. Wear it for 12-24 hours a day, and replace the sheet every few days as directed. - Benefits: Silicone helps reduce redness, flatten the scar, and improve its texture. Continued use over several months can lead to significant improvement in the appearance of the scar.  4. What to Expect: - Healing process: Scars generally take time to mature. The first few months may show redness or swelling, but this usually improves as healing progresses. - Long-term care: Scarring is a natural part of the healing process. While you cannot completely eliminate a scar, proper care can significantly improve its appearance over time. - Patience: It can take up to a year for a scar to fully mature, so it's important to be consistent with scar care and follow-up appointments with your provider.  5. When to Contact Your Healthcare Provider: - If you notice signs of infection (increased redness, warmth, drainage, or pain). - If your scar becomes unusually raised, itchy, or changes in color significantly. - If you have concerns about the appearance of your scar or experience unusual symptoms. - By following these guidelines, you can support your body's natural healing process and help ensure the best possible outcome for your scar. If you have any questions or concerns, please don't hesitate to contact our office.    Important Information   Due to recent changes in healthcare laws, you may  see results of your pathology and/or laboratory studies on MyChart before the doctors have had a chance to review them. We understand that in some cases there may be results that are confusing or concerning to you. Please understand that not all results are received at the same time and often the doctors may need to interpret multiple results in order  to provide you with the best plan of care or course of treatment. Therefore, we ask that you please give [place] 2 business days to thoroughly review all your results before contacting the office for clarification. Should we see a critical lab result, you will be contacted sooner.     If You Need Anything After Your Visit   If you have any questions or concerns for your doctor, please call our main line at [phone]. If no one answers, please leave a voicemail as directed and we will return your call as soon as possible. Messages left after 4 pm will be answered the following business day.    You may also send [place] a message via MyChart. We typically respond to MyChart messages within 1-2 business days.  For prescription refills, please ask your pharmacy to contact our office. Our fax number is [phone].  If you have an urgent issue when the clinic is closed that cannot wait until the next business day, you can page your doctor at the number below.     Please note that while we do our best to be available for urgent issues outside of office hours, we are not available 24/7.    If you have an urgent issue and are unable to reach [place], you may choose to seek medical care at your doctor's office, retail clinic, urgent care center, or emergency room.   If you have a medical emergency, please immediately call 911 or go to the emergency department. In the event of inclement weather, please call our main line at [phone] for an update on the status of any delays or closures.  Dermatology Medication Tips: Please keep the boxes that topical medications come in in order to help keep track of the instructions about where and how to use these. Pharmacies typically print the medication instructions only on the boxes and not directly on the medication tubes.   If your medication is too expensive, please contact our office at [phone] or send [place] a message through MyChart.    We are unable to tell  what your co-pay for medications will be in advance as this is different depending on your insurance coverage. However, we may be able to find a substitute medication at lower cost or fill out paperwork to get insurance to cover a needed medication.    If a prior authorization is required to get your medication covered by your insurance company, please allow [place] 1-2 business days to complete this process.   Drug prices often vary depending on where the prescription is filled and some pharmacies may offer cheaper prices.   The website www.goodrx.com contains coupons for medications through different pharmacies. The prices here do not account for what the cost may be with help from insurance (it may be cheaper with your insurance), but the website can give you the price if you did not use any insurance.  - You can print the associated coupon and take it with your prescription to the pharmacy.  - You may also stop by our office during regular business hours and pick up a GoodRx coupon card.  - If  you need your prescription sent electronically to a different pharmacy, notify our office through [facility] or by phone at [phone]

## 2023-08-21 NOTE — Assessment & Plan Note (Signed)
 Symptoms are stable currently.  Sertraline was recently increased to 50 mg daily and trazodone 50 mg added nightly as needed in the setting of insomnia.  She is not interested in making any additional changes today and will see psychiatry on 4/28.

## 2023-08-21 NOTE — Addendum Note (Signed)
 Addended by: Feliz Hosteller on: 08/21/2023 12:10 PM   Modules accepted: Orders

## 2023-08-21 NOTE — Progress Notes (Signed)
   Follow Up Visit   Subjective  Erin [name]  Erin [name] is a 63 y.o. female who presents for the following: follow up from Mohs surgery   The patient presents for follow up from Mohs surgery for a BCC on the right malar cheek, treated on 07/05/23, repaired with 2nd intention. The patient has been bandaging the wound as directed. The endorse the following concerns: none  Patient states that she has recently been diagnosed with Non Small Cell Lung Cancer is having surgery on 08/31/2023 and will follow up with chemotherapy.  The following portions of the chart were reviewed this encounter and updated as appropriate: medications, allergies, medical history  Review of Systems:  No other skin or systemic complaints except as noted in HPI or Assessment and Plan.  Objective  Well appearing patient in no apparent distress; mood and affect are within normal limits.  A full examination was performed including scalp, head, face. All findings within normal limits unless otherwise noted below.  Healing wound with mild erythema  Relevant physical exam findings are noted in the Assessment and Plan.    Assessment & Plan    Healing s/p Mohs for [facility], treated on 07/05/23, healing by 2nd intention - Reassured that wound is healing well - No evidence of infection - No swelling, induration, purulence, dehiscence, or tenderness out of proportion to the clinical exam, see photo above - Discussed that scars take up to 12 months to mature from the date of surgery - Recommend SPF 30+ to scar daily to prevent purple color from UV exposure during scar maturation process - Discussed that erythema and raised appearance of scar will fade over the next 4-6 months - OK to start scar massage at 4-6 weeks post-op - Can consider silicone based products for scar healing starting at 6 weeks post-op  HISTORY OF BASAL CELL CARCINOMA OF THE SKIN - No evidence of recurrence today - Recommend regular full body skin exams -  Recommend daily broad spectrum sunscreen SPF 30+ to sun-exposed areas, reapply every 2 hours as needed.  - Call if any new or changing lesions are noted between office visits  Return in about 6 months (around 02/20/2024) for TBSE.  I, Erin [name], CMA, am acting as scribe for Erin Finlay, MD.   Documentation: I have reviewed the above documentation for accuracy and completeness, and I agree with the above.  Erin Finlay, MD

## 2023-08-22 ENCOUNTER — Ambulatory Visit: Attending: Cardiology | Admitting: Cardiology

## 2023-08-22 ENCOUNTER — Encounter: Payer: Self-pay | Admitting: Cardiology

## 2023-08-22 VITALS — BP 94/58 | HR 72 | Ht 60.0 in | Wt 111.8 lb

## 2023-08-22 DIAGNOSIS — Z95810 Presence of automatic (implantable) cardiac defibrillator: Secondary | ICD-10-CM

## 2023-08-22 DIAGNOSIS — I255 Ischemic cardiomyopathy: Secondary | ICD-10-CM

## 2023-08-22 DIAGNOSIS — I5042 Chronic combined systolic (congestive) and diastolic (congestive) heart failure: Secondary | ICD-10-CM

## 2023-08-22 LAB — CUP PACEART INCLINIC DEVICE CHECK
Date Time Interrogation Session: 20250415121100
Implantable Lead Connection Status: 753985
Implantable Lead Implant Date: 20211126
Implantable Lead Location: 753860
Implantable Pulse Generator Implant Date: 20211126

## 2023-08-22 NOTE — Progress Notes (Signed)
  Electrophysiology Office Note:   Date:  08/22/2023  ID:  Erin  Erin [name], DOB 25-Dec-1960, MRN [id number]  Primary Cardiologist: Peder Bourdon, MD Primary Heart Failure: Peder Bourdon, MD Electrophysiologist: Lei Pump, MD      History of Present Illness:   Erin  TRACHELLE [name] is a 63 y.o. female with h/o coronary artery disease post RCA and circumflex stents, chronic systolic heart failure due to ischemic cardiomyopathy, peripheral arterial disease seen today for routine electrophysiology followup.   Since last being seen in our clinic the patient reports doing overall well from a cardiac perspective.  She has no acute cardiac complaints at this time.  She is able to do her daily activities.  Unfortunately, she has been diagnosed with stage Ia non-small cell lung cancer.  She has upcoming plans for surgery later this month.  She also tells me that she is a mass in her colon that they think may be cancer.  She has a colonoscopy upcoming as well.  she denies chest pain, palpitations, dyspnea, PND, orthopnea, nausea, vomiting, dizziness, syncope, edema, weight gain, or early satiety.   Review of systems complete and found to be negative unless listed in HPI.      EP Information / Studies Reviewed:    EKG is not ordered today. EKG from 08/15/23 reviewed which showed sinus rhythm      ICD Interrogation-  reviewed in detail today,  See PACEART report.  Device History: Medtronic Dual Chamber ICD implanted 04/03/2020 for chronic systolic heart failure History of appropriate therapy: No History of AAD therapy: No   Risk Assessment/Calculations:              Physical Exam:   VS:  BP (!) 94/58 (BP Location: Left Arm, Patient Position: Sitting, Cuff Size: Normal)   Pulse 72   Ht 5' (1.524 m)   Wt 111 lb 12.8 oz (50.7 kg)   SpO2 97%   BMI 21.83 kg/m    Wt Readings from Last 3 Encounters:  08/22/23 111 lb 12.8 oz (50.7 kg)  08/15/23 108 lb 3.2 oz (49.1 kg)  08/15/23 107 lb (48.5  kg)     GEN: Well nourished, well developed in no acute distress NECK: No JVD; No carotid bruits CARDIAC: Regular rate and rhythm, no murmurs, rubs, gallops RESPIRATORY:  Clear to auscultation without rales, wheezing or rhonchi  ABDOMEN: Soft, non-tender, non-distended EXTREMITIES:  No edema; No deformity   ASSESSMENT AND PLAN:    Chronic systolic dysfunction s/p Medtronic dual chamber ICD  euvolemic today Stable on an appropriate medical regimen Normal ICD function Sensing, threshold, impedance within normal limits Device programming reviewed and stable for patient See Valeta Gaudier Art report Adjusted OptiVol baseline Patient would be a candidate for CCM.  She is undergoing therapy for lung cancer.  Also needs a colonoscopy and potentially has a mass in her colon.  Juliette Standre reassess after therapy has completed.  2.  Coronary artery disease: Post non-STEMI in 2017 with circumflex and RCA stents.  No current chest pain.  3.  Tobacco abuse: Patient has quit  Case discussed with primary cardiology  Disposition:   Follow up with EP APP in 6 months   Signed, Marcy Sookdeo Cortland Ding, MD

## 2023-08-22 NOTE — Patient Instructions (Signed)
 Medication Instructions:  Your physician recommends that you continue on your current medications as directed. Please refer to the Current Medication list given to you today.  *If you need a refill on your cardiac medications before your next appointment, please call your pharmacy*  Lab Work: None ordered.  If you have labs (blood work) drawn today and your tests are completely normal, you will receive your results only by: MyChart Message (if you have MyChart) OR A paper copy in the mail If you have any lab test that is abnormal or we need to change your treatment, we will call you to review the results.  Testing/Procedures: None ordered.   Follow-Up: At [facility], you and your health needs are our priority.  As part of our continuing mission to provide you with exceptional heart care, our providers are all part of one team.  This team includes your primary Cardiologist (physician) and Advanced Practice Providers or APPs (Physician Assistants and Nurse Practitioners) who all work together to provide you with the care you need, when you need it.  Your next appointment:   6 months with Michaelle Adolphus, PA-C      1st Floor: - Lobby - Registration  - Pharmacy  - Lab - Cafe  2nd Floor: - PV Lab - Diagnostic Testing (echo, CT, nuclear med)  3rd Floor: - Vacant  4th Floor: - TCTS (cardiothoracic surgery) - AFib Clinic - Structural Heart Clinic - Vascular Surgery  - Vascular Ultrasound  5th Floor: - HeartCare Cardiology (general and EP) - Clinical Pharmacy for coumadin, hypertension, lipid, weight-loss medications, and med management appointments    Valet parking services will be available as well.

## 2023-08-23 ENCOUNTER — Ambulatory Visit (HOSPITAL_COMMUNITY)

## 2023-08-23 ENCOUNTER — Telehealth: Payer: Self-pay | Admitting: *Deleted

## 2023-08-23 ENCOUNTER — Encounter (HOSPITAL_COMMUNITY): Payer: Self-pay

## 2023-08-23 ENCOUNTER — Ambulatory Visit (HOSPITAL_COMMUNITY): Admit: 2023-08-23 | Admitting: Internal Medicine

## 2023-08-23 SURGERY — COLONOSCOPY
Anesthesia: Choice

## 2023-08-23 NOTE — Patient Instructions (Signed)
 [name]  08/23/2023     @PREFPERIOPPHARMACY @   Your procedure is scheduled on 08/28/2023.   Report to [facility] at 8:30 A.M.   Call this number if you have problems the morning of surgery:   [phone]  If you experience any cold or flu symptoms such as cough, fever, chills, shortness of breath, etc. between now and your scheduled surgery, please notify us at the above number.   Remember:   Please follow the diet and prep instructions given to you by Dr Queen Blossom office.   You may drink clear liquids until 6:30 AM .  Clear liquids allowed are:                    Water, Juice (No red color; non-citric and without pulp; diabetics please choose diet or no sugar options), Carbonated beverages (diabetics please choose diet or no sugar options), Clear Tea (No creamer, milk, or cream, including half & half and powdered creamer), Black Coffee Only (No creamer, milk or cream, including half & half and powdered creamer), Plain Jell-O Only (No red color; diabetics please choose no sugar options), Clear Sports drink (No red color; diabetics please choose diet or no sugar options), and Plain Popsicles Only (No red color; diabetics please choose no sugar options)    Take these medicines the morning of surgery with A SIP OF WATER : Neurontin Hydroxyzine Protonix Metoprolol Zofran (if needed) Requip Zoloft   Farxiga last dose should be 08/24/2023.   Plavix last dose should be 08/22/2023.    Do not wear jewelry, make-up or nail polish, including gel polish,  artificial nails, or any other type of covering on natural nails (fingers and  toes).  Do not wear lotions, powders, or perfumes, or deodorant.  Do not shave 48 hours prior to surgery.  Men may shave face and neck.  Do not bring valuables to the hospital.  [facility] is not responsible for any belongings or valuables.  Contacts, dentures or bridgework may not be worn into surgery.  Leave your suitcase in the car.  After surgery it  may be brought to your room.  For patients admitted to the hospital, discharge time will be determined by your treatment team.  Patients discharged the day of surgery will not be allowed to drive home.   Name and phone number of your driver:   Family  Special instructions:  N/A  Please read over the following fact sheets that you were given. Care and Recovery After Surgery   Colonoscopy, Adult A colonoscopy is a procedure to look at the entire large intestine. This procedure is done using a long, thin, flexible tube that has a camera on the end. You may have a colonoscopy: As a part of normal colorectal screening. If you have certain symptoms, such as: A low number of red blood cells in your blood (anemia). Diarrhea that does not go away. Pain in your abdomen. Blood in your stool. A colonoscopy can help screen for and diagnose medical problems, including: An abnormal growth of cells or tissue (tumor). Abnormal growths within the lining of your intestine (polyps). Inflammation. Areas of bleeding. Tell your health care provider about: Any allergies you have. All medicines you are taking, including vitamins, herbs, eye drops, creams, and over-the-counter medicines. Any problems you or family members have had with anesthetic medicines. Any bleeding problems you have. Any surgeries you have had. Any medical conditions you have. Any problems you have had with having  bowel movements. Whether you are pregnant or may be pregnant. What are the risks? Generally, this is a safe procedure. However, problems may occur, including: Bleeding. Damage to your intestine. Allergic reactions to medicines given during the procedure. Infection. This is rare. What happens before the procedure? Eating and drinking restrictions Follow instructions from your health care provider about eating or drinking restrictions, which may include: A few days before the procedure: Follow a low-fiber  diet. Avoid nuts, seeds, dried fruit, raw fruits, and vegetables. 1-3 days before the procedure: Eat only gelatin dessert or ice pops. Drink only clear liquids, such as water, clear juice, clear broth or bouillon, black coffee or tea, or clear soft drinks or sports drinks. Avoid liquids that contain red or purple dye. The day of the procedure: Do not eat solid foods. You may continue to drink clear liquids until up to 2 hours before the procedure. Do not eat or drink anything starting 2 hours before the procedure, or within the time period that your health care provider recommends. Bowel prep If you were prescribed a bowel prep to take by mouth (orally) to clean out your colon: Take it as told by your health care provider. Starting the day before your procedure, you will need to drink a large amount of liquid medicine. The liquid will cause you to have many bowel movements of loose stool until your stool becomes almost clear or light green. If your skin or the opening between the buttocks (anus) gets irritated from diarrhea, you may relieve the irritation using: Wipes with medicine in them, such as adult wet wipes with aloe and vitamin E. A product to soothe skin, such as petroleum jelly. If you vomit while drinking the bowel prep: Take a break for up to 60 minutes. Begin the bowel prep again. Call your health care provider if you keep vomiting or you cannot take the bowel prep without vomiting. To clean out your colon, you may also be given: Laxative medicines. These help you have a bowel movement. Instructions for enema use. An enema is liquid medicine injected into your rectum. Medicines Ask your health care provider about: Changing or stopping your regular medicines or supplements. This is especially important if you are taking iron supplements, diabetes medicines, or blood thinners. Taking medicines such as aspirin and ibuprofen. These medicines can thin your blood. Do not take these  medicines unless your health care provider tells you to take them. Taking over-the-counter medicines, vitamins, herbs, and supplements. General instructions Ask your health care provider what steps will be taken to help prevent infection. These may include washing skin with a germ-killing soap. If you will be going home right after the procedure, plan to have a responsible adult: Take you home from the hospital or clinic. You will not be allowed to drive. Care for you for the time you are told. What happens during the procedure?  An IV will be inserted into one of your veins. You will be given a medicine to make you fall asleep (general anesthetic). You will lie on your side with your knees bent. A lubricant will be put on the tube. Then the tube will be: Inserted into your anus. Gently eased through all parts of your large intestine. Air will be sent into your colon to keep it open. This may cause some pressure or cramping. Images will be taken with the camera and will appear on a screen. A small tissue sample may be removed to be looked at under a  microscope (biopsy). The tissue may be sent to a lab for testing if any signs of problems are found. If small polyps are found, they may be removed and checked for cancer cells. When the procedure is finished, the tube will be removed. The procedure may vary among health care providers and hospitals. What happens after the procedure? Your blood pressure, heart rate, breathing rate, and blood oxygen level will be monitored until you leave the hospital or clinic. You may have a small amount of blood in your stool. You may pass gas and have mild cramping or bloating in your abdomen. This is caused by the air that was used to open your colon during the exam. If you were given a sedative during the procedure, it can affect you for several hours. Do not drive or operate machinery until your health care provider says that it is safe. It is up to you to  get the results of your procedure. Ask your health care provider, or the department that is doing the procedure, when your results will be ready. Summary A colonoscopy is a procedure to look at the entire large intestine. Follow instructions from your health care provider about eating and drinking before the procedure. If you were prescribed an oral bowel prep to clean out your colon, take it as told by your health care provider. During the colonoscopy, a flexible tube with a camera on its end is inserted into the anus and then passed into all parts of the large intestine. This information is not intended to replace advice given to you by your health care provider. Make sure you discuss any questions you have with your health care provider. Document Revised: 06/07/2022 Document Reviewed: 12/16/2020 Elsevier Patient Education  2024 [employer]  Monitored Anesthesia Care Anesthesia refers to the techniques, procedures, and medicines that help a person stay safe and comfortable during surgery. Monitored anesthesia care, or sedation, is one type of anesthesia. You may have sedation if you do not need to be asleep for your procedure. Procedures that use sedation may include: Surgery to remove cataracts from your eyes. A dental procedure. A biopsy. This is when a tissue sample is removed and looked at under a microscope. You will be watched closely during your procedure. Your level of sedation or type of anesthesia may be changed to fit your needs. Tell a health care provider about: Any allergies you have. All medicines you are taking, including vitamins, herbs, eye drops, creams, and over-the-counter medicines. Any problems you or family members have had with anesthesia. Any bleeding problems you have. Any surgeries you have had. Any medical conditions or illnesses you have. This includes sleep apnea, cough, fever, or the flu. Whether you are pregnant or may be pregnant. Whether you use  cigarettes, alcohol, or drugs. Any use of steroids, whether by mouth or as a cream. What are the risks? Your health care provider will talk with you about risks. These may include: Getting too much medicine (oversedation). Nausea. Allergic reactions to medicines. Trouble breathing. If this happens, a breathing tube may be used to help you breathe. It will be removed when you are awake and breathing on your own. Heart trouble. Lung trouble. Confusion that gets better with time (emergence delirium). What happens before the procedure? When to stop eating and drinking Follow instructions from your health care provider about what you may eat and drink. These may include: 8 hours before your procedure Stop eating most foods. Do not eat meat, fried foods,  or fatty foods. Eat only light foods, such as toast or crackers. All liquids are okay except energy drinks and alcohol. 6 hours before your procedure Stop eating. Drink only clear liquids, such as water, clear fruit juice, black coffee, plain tea, and sports drinks. Do not drink energy drinks or alcohol. 2 hours before your procedure Stop drinking all liquids. You may be allowed to take medicines with small sips of water. If you do not follow your health care provider's instructions, your procedure may be delayed or canceled. Medicines Ask your health care provider about: Changing or stopping your regular medicines. These include any diabetes medicines or blood thinners you take. Taking medicines such as aspirin and ibuprofen. These medicines can thin your blood. Do not take them unless your health care provider tells you to. Taking over-the-counter medicines, vitamins, herbs, and supplements. Testing You may have an exam or testing. You may have a blood or urine sample taken. General instructions Do not use any products that contain nicotine or tobacco for at least 4 weeks before the procedure. These products include cigarettes, chewing  tobacco, and vaping devices, such as e-cigarettes. If you need help quitting, ask your health care provider. If you will be going home right after the procedure, plan to have a responsible adult: Take you home from the hospital or clinic. You will not be allowed to drive. Care for you for the time you are told. What happens during the procedure?  Your blood pressure, heart rate, breathing, level of pain, and blood oxygen level will be monitored. An IV will be inserted into one of your veins. You may be given: A sedative. This helps you relax. Anesthesia. This will: Numb certain areas of your body. Make you fall asleep for surgery. You will be given medicines as needed to keep you comfortable. The more medicine you are given, the deeper your level of sedation will be. Your level of sedation may be changed to fit your needs. There are three levels of sedation: Mild sedation. At this level, you may feel awake and relaxed. You will be able to follow directions. Moderate sedation. At this level, you will be sleepy. You may not remember the procedure. Deep sedation. At this level, you will be asleep. You will not remember the procedure. How you get the medicines will depend on your age and the procedure. They may be given as: A pill. This may be taken by mouth (orally) or inserted into the rectum. An injection. This may be into a vein or muscle. A spray through the nose. After your procedure is over, the medicine will be stopped. The procedure may vary among health care providers and hospitals. What happens after the procedure? Your blood pressure, heart rate, breathing rate, and blood oxygen level will be monitored until you leave the hospital or clinic. You may feel sleepy, clumsy, or nauseous. You may not remember what happened during or after the procedure. Sedation can affect you for several hours. Do not drive or use machinery until your health care provider says that it is safe. This  information is not intended to replace advice given to you by your health care provider. Make sure you discuss any questions you have with your health care provider. Document Revised: 09/20/2021 Document Reviewed: 09/20/2021 Elsevier Patient Education  2024 [employer].

## 2023-08-23 NOTE — Telephone Encounter (Signed)
 PA approved via cohere. Authorization #[id number] DOS: 08/23/2023 - 10/22/2023

## 2023-08-24 ENCOUNTER — Other Ambulatory Visit: Payer: Self-pay

## 2023-08-24 ENCOUNTER — Encounter (HOSPITAL_COMMUNITY): Payer: Self-pay

## 2023-08-24 ENCOUNTER — Encounter (HOSPITAL_COMMUNITY)
Admission: RE | Admit: 2023-08-24 | Discharge: 2023-08-24 | Disposition: A | Discharge status: Home / Self Care | Source: Ambulatory Visit | Attending: Internal Medicine | Admitting: Internal Medicine

## 2023-08-24 VITALS — BP 94/58 | HR 72 | Temp 97.8°F | Resp 18 | Ht 60.0 in | Wt 111.8 lb

## 2023-08-24 DIAGNOSIS — I10 Essential (primary) hypertension: Secondary | ICD-10-CM

## 2023-08-24 DIAGNOSIS — D649 Anemia, unspecified: Secondary | ICD-10-CM

## 2023-08-25 ENCOUNTER — Ambulatory Visit (HOSPITAL_COMMUNITY)
Admission: RE | Admit: 2023-08-25 | Discharge: 2023-08-25 | Disposition: A | Discharge status: Home / Self Care | Source: Ambulatory Visit | Attending: Acute Care | Admitting: Acute Care

## 2023-08-25 DIAGNOSIS — C3490 Malignant neoplasm of unspecified part of unspecified bronchus or lung: Secondary | ICD-10-CM | POA: Diagnosis present

## 2023-08-25 MED ORDER — GADOBUTROL 1 MMOL/ML IV SOLN
5.0000 mL | Freq: Once | INTRAVENOUS | Status: AC | PRN
  Administered 2023-08-25: 5 mL via INTRAVENOUS

## 2023-08-28 ENCOUNTER — Ambulatory Visit (HOSPITAL_COMMUNITY): Admitting: Anesthesiology

## 2023-08-28 ENCOUNTER — Encounter (HOSPITAL_COMMUNITY)
Admission: RE | Disposition: A | Discharge status: Home / Self Care | Payer: Self-pay | Source: Home / Self Care | Attending: Internal Medicine

## 2023-08-28 ENCOUNTER — Ambulatory Visit (HOSPITAL_BASED_OUTPATIENT_CLINIC_OR_DEPARTMENT_OTHER)
Admission: RE | Admit: 2023-08-28 | Discharge: 2023-08-28 | Disposition: A | Discharge status: Home / Self Care | Source: Home / Self Care | Attending: Internal Medicine | Admitting: Internal Medicine

## 2023-08-28 ENCOUNTER — Ambulatory Visit: Attending: Cardiology

## 2023-08-28 ENCOUNTER — Other Ambulatory Visit: Payer: Self-pay

## 2023-08-28 ENCOUNTER — Encounter (HOSPITAL_COMMUNITY): Payer: Self-pay | Admitting: Internal Medicine

## 2023-08-28 DIAGNOSIS — I110 Hypertensive heart disease with heart failure: Secondary | ICD-10-CM | POA: Insufficient documentation

## 2023-08-28 DIAGNOSIS — Z955 Presence of coronary angioplasty implant and graft: Secondary | ICD-10-CM | POA: Insufficient documentation

## 2023-08-28 DIAGNOSIS — R933 Abnormal findings on diagnostic imaging of other parts of digestive tract: Secondary | ICD-10-CM | POA: Insufficient documentation

## 2023-08-28 DIAGNOSIS — Z95810 Presence of automatic (implantable) cardiac defibrillator: Secondary | ICD-10-CM

## 2023-08-28 DIAGNOSIS — K649 Unspecified hemorrhoids: Secondary | ICD-10-CM

## 2023-08-28 DIAGNOSIS — Z7902 Long term (current) use of antithrombotics/antiplatelets: Secondary | ICD-10-CM | POA: Insufficient documentation

## 2023-08-28 DIAGNOSIS — K219 Gastro-esophageal reflux disease without esophagitis: Secondary | ICD-10-CM | POA: Insufficient documentation

## 2023-08-28 DIAGNOSIS — I2510 Atherosclerotic heart disease of native coronary artery without angina pectoris: Secondary | ICD-10-CM | POA: Insufficient documentation

## 2023-08-28 DIAGNOSIS — I5042 Chronic combined systolic (congestive) and diastolic (congestive) heart failure: Secondary | ICD-10-CM

## 2023-08-28 DIAGNOSIS — K648 Other hemorrhoids: Secondary | ICD-10-CM | POA: Insufficient documentation

## 2023-08-28 DIAGNOSIS — I252 Old myocardial infarction: Secondary | ICD-10-CM | POA: Insufficient documentation

## 2023-08-28 DIAGNOSIS — I5022 Chronic systolic (congestive) heart failure: Secondary | ICD-10-CM | POA: Insufficient documentation

## 2023-08-28 DIAGNOSIS — Z87891 Personal history of nicotine dependence: Secondary | ICD-10-CM | POA: Insufficient documentation

## 2023-08-28 DIAGNOSIS — I714 Abdominal aortic aneurysm, without rupture, unspecified: Secondary | ICD-10-CM | POA: Insufficient documentation

## 2023-08-28 HISTORY — PX: COLONOSCOPY: SHX5424

## 2023-08-28 SURGERY — COLONOSCOPY
Anesthesia: General

## 2023-08-28 MED ORDER — OXYCODONE HCL 5 MG PO TABS: 5.0000 mg | ORAL_TABLET | Freq: Once | ORAL | Status: DC | PRN

## 2023-08-28 MED ORDER — FENTANYL CITRATE (PF) 100 MCG/2ML IJ SOLN: 25.0000 ug | INTRAMUSCULAR | Status: DC | PRN

## 2023-08-28 MED ORDER — EPHEDRINE SULFATE-NACL 50-0.9 MG/10ML-% IV SOSY
PREFILLED_SYRINGE | INTRAVENOUS | Status: DC | PRN
  Administered 2023-08-28: 10 mg via INTRAVENOUS

## 2023-08-28 MED ORDER — PROPOFOL 10 MG/ML IV BOLUS
INTRAVENOUS | Status: DC | PRN
  Administered 2023-08-28: 50 mg via INTRAVENOUS

## 2023-08-28 MED ORDER — ACETAMINOPHEN 325 MG PO TABS: 650.0000 mg | ORAL_TABLET | Freq: Four times a day (QID) | ORAL | Status: DC | PRN

## 2023-08-28 MED ORDER — LACTATED RINGERS IV SOLN: INTRAVENOUS | Status: DC

## 2023-08-28 MED ORDER — LIDOCAINE HCL (CARDIAC) PF 100 MG/5ML IV SOSY
PREFILLED_SYRINGE | INTRAVENOUS | Status: DC | PRN
Start: 2023-08-28 — End: 2023-08-28
  Administered 2023-08-28: 50 mg via INTRATRACHEAL

## 2023-08-28 MED ORDER — DROPERIDOL 2.5 MG/ML IJ SOLN: 0.6250 mg | Freq: Once | INTRAMUSCULAR | Status: DC | PRN

## 2023-08-28 MED ORDER — LACTATED RINGERS IV SOLN: INTRAVENOUS | Status: DC | PRN

## 2023-08-28 MED ORDER — PROPOFOL 500 MG/50ML IV EMUL
INTRAVENOUS | Status: DC | PRN
  Administered 2023-08-28: 150 ug/kg/min via INTRAVENOUS

## 2023-08-28 MED ORDER — OXYCODONE HCL 5 MG/5ML PO SOLN: 5.0000 mg | Freq: Once | ORAL | Status: DC | PRN

## 2023-08-28 NOTE — Transfer of Care (Signed)
 Immediate Anesthesia Transfer of Care Note  Patient: Erin  J [name]  Procedure(s) Performed: COLONOSCOPY  Patient Location: Endoscopy Unit  Anesthesia Type:General  Level of Consciousness: awake, alert , oriented, and patient cooperative  Airway & Oxygen  Therapy: Patient Spontanous Breathing  Post-op Assessment: Report given to RN, Post -op Vital signs reviewed and stable, and Patient moving all extremities X 4  Post vital signs: Reviewed and stable  Last Vitals:  Vitals Value Taken Time  BP 89/60 08/28/23 1132  Temp    Pulse 86 08/28/23 1133  Resp 20 08/28/23 1133  SpO2 100 % 08/28/23 1133  Vitals shown include unfiled device data.  Last Pain:  Vitals:   08/28/23 1109  TempSrc:   PainSc: 0-No pain         Complications: No notable events documented.

## 2023-08-28 NOTE — Interval H&P Note (Signed)
 History and Physical Interval Note:  08/28/2023 10:22 AM  Nieves  J Sadlowski  has presented today for surgery, with the diagnosis of PET scan findings in the anorectal area..  The various methods of treatment have been discussed with the patient and family. After consideration of risks, benefits and other options for treatment, the patient has consented to  Procedure(s) with comments: COLONOSCOPY (N/A) - 1030am, asa 3, has defibrillator as a surgical intervention.  The patient's history has been reviewed, patient examined, no change in status, stable for surgery.  I have reviewed the patient's chart and labs.  Questions were answered to the patient's satisfaction.     Vinetta Greening

## 2023-08-28 NOTE — Anesthesia Preprocedure Evaluation (Signed)
 Anesthesia Evaluation  Patient identified by MRN, date of birth, ID band Patient awake    Reviewed: Allergy & Precautions, H&P , NPO status , Patient's Chart, lab work & pertinent test results, reviewed documented beta blocker date and time   Airway Mallampati: II  TM Distance: >3 FB Neck ROM: full    Dental no notable dental hx.    Pulmonary pneumonia, former smoker   Pulmonary exam normal breath sounds clear to auscultation       Cardiovascular Exercise Tolerance: Good hypertension, + CAD, + Past MI, + Cardiac Stents and +CHF  + Cardiac Defibrillator  Rhythm:regular Rate:Normal     Neuro/Psych  PSYCHIATRIC DISORDERS Anxiety Depression     Neuromuscular disease    GI/Hepatic Neg liver ROS,GERD  ,,  Endo/Other  negative endocrine ROS    Renal/GU Renal disease  negative genitourinary   Musculoskeletal   Abdominal   Peds  Hematology  (+) Blood dyscrasia, anemia   Anesthesia Other Findings EF 30-35  Reproductive/Obstetrics negative OB ROS                             Anesthesia Physical Anesthesia Plan  ASA: 3  Anesthesia Plan: General   Post-op Pain Management:    Induction:   PONV Risk Score and Plan: Propofol  infusion  Airway Management Planned:   Additional Equipment:   Intra-op Plan:   Post-operative Plan:   Informed Consent: I have reviewed the patients History and Physical, chart, labs and discussed the procedure including the risks, benefits and alternatives for the proposed anesthesia with the patient or authorized representative who has indicated his/her understanding and acceptance.     Dental Advisory Given  Plan Discussed with: CRNA  Anesthesia Plan Comments:        Anesthesia Quick Evaluation

## 2023-08-28 NOTE — Op Note (Signed)
 [facility] Patient Name: Erin  [name] Procedure Date: 08/28/2023 10:58 AM MRN: [id number] Date of Birth: [birth year] Attending MD: Rolando Cliche. Mordechai April , [place], [id number] CSN: [id number] Age: 63 Admit Type: Outpatient Procedure:                Colonoscopy Indications:              Abnormal PET scan of the GI tract Providers:                Rolando Cliche. Mordechai April, DO, Graydon Lazier RN, RN, Sharlette Dayhoff Technician, Technician Referring MD:              Medicines:                See the Anesthesia note for documentation of the                            administered medications Complications:            No immediate complications. Estimated Blood Loss:     Estimated blood loss: none. Procedure:                Pre-Anesthesia Assessment:                           - The anesthesia plan was to use monitored                            anesthesia care (MAC).                           After obtaining informed consent, the colonoscope                            was passed under direct vision. Throughout the                            procedure, the patient's blood pressure, pulse, and                            oxygen  saturations were monitored continuously. The                            [id number] ([id number]) was introduced through the                            anus and advanced to the the cecum, identified by                            appendiceal orifice and ileocecal valve. The                            colonoscopy was performed without difficulty. The                            patient tolerated the  procedure well. The quality                            of the bowel preparation was evaluated using the                            BBPS [facility] Bowel Preparation Scale) with scores                            of: Right Colon = 3, Transverse Colon = 3 and Left                            Colon = 3 (entire mucosa seen well with no residual                            staining,  small fragments of stool or opaque                            liquid). The total BBPS score equals 9. Scope In: 11:14:15 AM Scope Out: 11:25:10 AM Scope Withdrawal Time: 0 hours 6 minutes 29 seconds  Total Procedure Duration: 0 hours 10 minutes 55 seconds  Findings:      Non-bleeding internal hemorrhoids were found during retroflexion.      Evidence of mild barotrauma in the cecum/ascending colon      The exam was otherwise without abnormality. Impression:               - Non-bleeding internal hemorrhoids.                           - The examination was otherwise normal.                           - No specimens collected. Moderate Sedation:      Per Anesthesia Care Recommendation:           - Patient has a contact number available for                            emergencies. The signs and symptoms of potential                            delayed complications were discussed with the                            patient. Return to normal activities tomorrow.                            Written discharge instructions were provided to the                            patient.                           - Resume previous diet.                           -  Continue present medications.                           - Repeat colonoscopy in 10 years for screening                            purposes.                           - Return to GI clinic in 8 weeks. Procedure Code(s):        --- Professional ---                           [phone], Colonoscopy, flexible; diagnostic, including                            collection of specimen(s) by brushing or washing,                            when performed (separate procedure) Diagnosis Code(s):        --- Professional ---                           K64.8, Other hemorrhoids                           R93.3, Abnormal findings on diagnostic imaging of                            other parts of digestive tract CPT copyright 2022 American Medical Association. All  rights reserved. The codes documented in this report are preliminary and upon coder review may  be revised to meet current compliance requirements. Rolando Cliche. Mordechai April, DO Rolando Cliche. Mordechai April, DO 08/28/2023 11:29:37 AM This report has been signed electronically. Number of Addenda: 0

## 2023-08-28 NOTE — Discharge Instructions (Addendum)
  Colonoscopy Discharge Instructions  Read the instructions outlined below and refer to this sheet in the next few weeks. These discharge instructions provide you with general information on caring for yourself after you leave the hospital. Your doctor may also give you specific instructions. While your treatment has been planned according to the most current medical practices available, unavoidable complications occasionally occur.   ACTIVITY You may resume your regular activity, but move at a slower pace for the next 24 hours.  Take frequent rest periods for the next 24 hours.  Walking will help get rid of the air and reduce the bloated feeling in your belly (abdomen).  No driving for 24 hours (because of the medicine (anesthesia) used during the test).   Do not sign any important legal documents or operate any machinery for 24 hours (because of the anesthesia used during the test).  NUTRITION Drink plenty of fluids.  You may resume your normal diet as instructed by your doctor.  Begin with a light meal and progress to your normal diet. Heavy or fried foods are harder to digest and may make you feel sick to your stomach (nauseated).  Avoid alcoholic beverages for 24 hours or as instructed.  MEDICATIONS You may resume your normal medications unless your doctor tells you otherwise.  WHAT YOU CAN EXPECT TODAY Some feelings of bloating in the abdomen.  Passage of more gas than usual.  Spotting of blood in your stool or on the toilet paper.  IF YOU HAD POLYPS REMOVED DURING THE COLONOSCOPY: No aspirin  products for 7 days or as instructed.  No alcohol for 7 days or as instructed.  Eat a soft diet for the next 24 hours.  FINDING OUT THE RESULTS OF YOUR TEST Not all test results are available during your visit. If your test results are not back during the visit, make an appointment with your caregiver to find out the results. Do not assume everything is normal if you have not heard from your  caregiver or the medical facility. It is important for you to follow up on all of your test results.  SEEK IMMEDIATE MEDICAL ATTENTION IF: You have more than a spotting of blood in your stool.  Your belly is swollen (abdominal distention).  You are nauseated or vomiting.  You have a temperature over 101.  You have abdominal pain or discomfort that is severe or gets worse throughout the day.   Your colonoscopy was relatively unremarkable.  I did not find any polyps or evidence of colon cancer.  I recommend repeating colonoscopy in 10 years for colon cancer screening purposes.  You do have internal hemorrhoids. I would recommend increasing fiber in your diet or adding OTC Benefiber/Metamucil. Be sure to drink at least 4 to 6 glasses of water  daily. Follow-up with GI in 8 weeks.   I hope you have a great rest of your week!  Rolando Cliche. Mordechai April, D.O. Gastroenterology and Hepatology [facility]

## 2023-08-29 ENCOUNTER — Encounter (HOSPITAL_COMMUNITY)
Admission: RE | Admit: 2023-08-29 | Discharge: 2023-08-29 | Disposition: A | Discharge status: Home / Self Care | Source: Ambulatory Visit | Attending: Thoracic Surgery (Cardiothoracic Vascular Surgery) | Admitting: Thoracic Surgery (Cardiothoracic Vascular Surgery)

## 2023-08-29 ENCOUNTER — Ambulatory Visit (HOSPITAL_COMMUNITY)
Admission: RE | Admit: 2023-08-29 | Discharge: 2023-08-29 | Disposition: A | Discharge status: Home / Self Care | Source: Ambulatory Visit | Attending: Thoracic Surgery (Cardiothoracic Vascular Surgery) | Admitting: Thoracic Surgery (Cardiothoracic Vascular Surgery)

## 2023-08-29 ENCOUNTER — Telehealth: Payer: Self-pay

## 2023-08-29 ENCOUNTER — Encounter (HOSPITAL_COMMUNITY): Payer: Self-pay | Admitting: Internal Medicine

## 2023-08-29 DIAGNOSIS — C3412 Malignant neoplasm of upper lobe, left bronchus or lung: Secondary | ICD-10-CM | POA: Insufficient documentation

## 2023-08-29 DIAGNOSIS — I252 Old myocardial infarction: Secondary | ICD-10-CM | POA: Insufficient documentation

## 2023-08-29 DIAGNOSIS — K219 Gastro-esophageal reflux disease without esophagitis: Secondary | ICD-10-CM | POA: Insufficient documentation

## 2023-08-29 DIAGNOSIS — I2510 Atherosclerotic heart disease of native coronary artery without angina pectoris: Secondary | ICD-10-CM | POA: Insufficient documentation

## 2023-08-29 DIAGNOSIS — Z91014 Allergy to mammalian meats: Secondary | ICD-10-CM | POA: Insufficient documentation

## 2023-08-29 DIAGNOSIS — Z01818 Encounter for other preprocedural examination: Secondary | ICD-10-CM | POA: Insufficient documentation

## 2023-08-29 DIAGNOSIS — E785 Hyperlipidemia, unspecified: Secondary | ICD-10-CM | POA: Insufficient documentation

## 2023-08-29 DIAGNOSIS — Z79899 Other long term (current) drug therapy: Secondary | ICD-10-CM | POA: Insufficient documentation

## 2023-08-29 DIAGNOSIS — Z955 Presence of coronary angioplasty implant and graft: Secondary | ICD-10-CM | POA: Insufficient documentation

## 2023-08-29 DIAGNOSIS — Z95810 Presence of automatic (implantable) cardiac defibrillator: Secondary | ICD-10-CM | POA: Insufficient documentation

## 2023-08-29 DIAGNOSIS — Z87891 Personal history of nicotine dependence: Secondary | ICD-10-CM | POA: Insufficient documentation

## 2023-08-29 DIAGNOSIS — N189 Chronic kidney disease, unspecified: Secondary | ICD-10-CM | POA: Insufficient documentation

## 2023-08-29 DIAGNOSIS — D631 Anemia in chronic kidney disease: Secondary | ICD-10-CM | POA: Insufficient documentation

## 2023-08-29 DIAGNOSIS — I255 Ischemic cardiomyopathy: Secondary | ICD-10-CM | POA: Insufficient documentation

## 2023-08-29 DIAGNOSIS — I5042 Chronic combined systolic (congestive) and diastolic (congestive) heart failure: Secondary | ICD-10-CM | POA: Insufficient documentation

## 2023-08-29 DIAGNOSIS — Z96612 Presence of left artificial shoulder joint: Secondary | ICD-10-CM | POA: Insufficient documentation

## 2023-08-29 DIAGNOSIS — I130 Hypertensive heart and chronic kidney disease with heart failure and stage 1 through stage 4 chronic kidney disease, or unspecified chronic kidney disease: Secondary | ICD-10-CM | POA: Insufficient documentation

## 2023-08-29 LAB — CBC
HCT: 31.2 % — ABNORMAL LOW (ref 36.0–46.0)
Hemoglobin: 10 g/dL — ABNORMAL LOW (ref 12.0–15.0)
MCH: 26.3 pg (ref 26.0–34.0)
MCHC: 32.1 g/dL (ref 30.0–36.0)
MCV: 82.1 fL (ref 80.0–100.0)
Platelets: 384 10*3/uL (ref 150–400)
RBC: 3.8 MIL/uL — ABNORMAL LOW (ref 3.87–5.11)
RDW: 16.8 % — ABNORMAL HIGH (ref 11.5–15.5)
WBC: 7.2 10*3/uL (ref 4.0–10.5)
nRBC: 0 % (ref 0.0–0.2)

## 2023-08-29 LAB — URINALYSIS, ROUTINE W REFLEX MICROSCOPIC
Bilirubin Urine: NEGATIVE
Glucose, UA: NEGATIVE mg/dL
Hgb urine dipstick: NEGATIVE
Ketones, ur: NEGATIVE mg/dL
Leukocytes,Ua: NEGATIVE
Nitrite: NEGATIVE
Protein, ur: NEGATIVE mg/dL
Specific Gravity, Urine: 1.014 (ref 1.005–1.030)
pH: 6 (ref 5.0–8.0)

## 2023-08-29 LAB — COMPREHENSIVE METABOLIC PANEL WITH GFR
ALT: 15 U/L (ref 0–44)
AST: 25 U/L (ref 15–41)
Albumin: 3.6 g/dL (ref 3.5–5.0)
Alkaline Phosphatase: 28 U/L — ABNORMAL LOW (ref 38–126)
Anion gap: 9 (ref 5–15)
BUN: 13 mg/dL (ref 8–23)
CO2: 26 mmol/L (ref 22–32)
Calcium: 9.6 mg/dL (ref 8.9–10.3)
Chloride: 105 mmol/L (ref 98–111)
Creatinine, Ser: 1.29 mg/dL — ABNORMAL HIGH (ref 0.44–1.00)
GFR, Estimated: 47 mL/min — ABNORMAL LOW (ref >60)
Glucose, Bld: 85 mg/dL (ref 70–99)
Potassium: 4.3 mmol/L (ref 3.5–5.1)
Sodium: 140 mmol/L (ref 135–145)
Total Bilirubin: 0.4 mg/dL (ref 0.0–1.2)
Total Protein: 6 g/dL — ABNORMAL LOW (ref 6.5–8.1)

## 2023-08-29 LAB — TYPE AND SCREEN
ABO/RH(D): O POS
Antibody Screen: NEGATIVE

## 2023-08-29 LAB — PROTIME-INR
INR: 0.9 (ref 0.8–1.2)
Prothrombin Time: 12.7 s (ref 11.4–15.2)

## 2023-08-29 LAB — APTT: aPTT: 32 s (ref 24–36)

## 2023-08-29 NOTE — Progress Notes (Signed)
 PCP -  [name], MD  Cardiologist - Peder Bourdon, MD   PPM/ICD - [employer]  Device Orders -  yes  Rep Notified - yes   Chest Ct- 04-28-23 PFTs- 08-09-23 Chest x-ray -  EKG - 08-15-23 Stress Test - 08-14-23 ECHO - 08-03-23 Cardiac Cath - 08-19-19  Sleep Study - denies CPAP -   DM -denies  Blood Thinner Instructions: Plavix  last dose Aspirin  Instructions: last dose  ERAS Protcol - NPO   COVID TEST- n/a   Anesthesia review: yes  Patient denies shortness of breath, fever, cough and chest pain at PAT appointment   All instructions explained to the patient, with a verbal understanding of the material. Patient agrees to go over the instructions while at home for a better understanding. Patient also instructed to self quarantine after being tested for COVID-19. The opportunity to ask questions was provided.

## 2023-08-29 NOTE — Progress Notes (Signed)
 Surgical Instructions     Your procedure is scheduled on Thursday, April 24, 25.. Report to [facility] "[facility]" at 6:00 A.M., then check in with the Admitting office. Any questions or running late day of surgery: call [phone]   Questions prior to your surgery date: call [phone], Monday-Friday, 8am-4pm. If you experience any cold or flu symptoms such as cough, fever, chills, shortness of breath, etc. between now and your scheduled surgery, please notify us  at the above number.            Remember:       Do not eat or drink after midnight the night before your surgery          Take these medicines the morning of surgery with A SIP OF WATER   atorvastatin  (LIPITOR )  Evolocumab  (REPATHA  SURECLICK)  fenofibrate  (TRICOR )  gabapentin  (NEURONTIN )  metoprolol  succinate (TOPROL  XL)  pantoprazole  (PROTONIX )  sertraline  (ZOLOFT )      May take these medicines IF NEEDED: EPINEPHrine  (EPIPEN  2-PAK)  hydrOXYzine  (VISTARIL )  ondansetron  (ZOFRAN )    Per your surgeon, clopidogrel  (PLAVIX ) will need to be held for 5 days prior to surgery. Your last dose should be on Saturday, April 18.     One week prior to surgery, STOP taking any Aspirin  (unless otherwise instructed by your surgeon) Aleve, Naproxen, Ibuprofen, Motrin, Advil, Goody's, BC's, all herbal medications, fish oil, and non-prescription vitamins.                     Do NOT Smoke (Tobacco/Vaping) for 24 hours prior to your procedure.   If you use a CPAP at night, you may bring your mask/headgear for your overnight stay.   You will be asked to remove any contacts, glasses, piercing's, hearing aid's, dentures/partials prior to surgery. Please bring cases for these items if needed.    Patients discharged the day of surgery will not be allowed to drive home, and someone needs to stay with them for 24 hours.   SURGICAL WAITING ROOM VISITATION Patients may have no more than 2 support people in the waiting area - these  visitors may rotate.   Pre-op nurse will coordinate an appropriate time for 1 ADULT support person, who may not rotate, to accompany patient in pre-op.  Children under the age of 33 must have an adult with them who is not the patient and must remain in the main waiting area with an adult.   If the patient needs to stay at the hospital during part of their recovery, the visitor guidelines for inpatient rooms apply.   Please refer to the [facility] website for the visitor guidelines for any additional information.     If you received a COVID test during your pre-op visit  it is requested that you wear a mask when out in public, stay away from anyone that may not be feeling well and notify your surgeon if you develop symptoms. If you have been in contact with anyone that has tested positive in the last 10 days please notify you surgeon.         Pre-operative CHG Bathing Instructions    You can play a key role in reducing the risk of infection after surgery. Your skin needs to be as free of germs as possible. You can reduce the number of germs on your skin by washing with CHG (chlorhexidine  gluconate) soap before surgery. CHG is an antiseptic soap that kills germs and continues to kill germs even after washing.  DO NOT use if you have an allergy to chlorhexidine /CHG or antibacterial soaps. If your skin becomes reddened or irritated, stop using the CHG and notify one of our RNs at [phone].               TAKE A SHOWER THE NIGHT BEFORE SURGERY AND THE DAY OF SURGERY     Please keep in mind the following:  DO NOT shave, including legs and underarms, 48 hours prior to surgery.   You may shave your face before/day of surgery.  Place clean sheets on your bed the night before surgery Use a clean washcloth (not used since being washed) for each shower. DO NOT sleep with pet's night before surgery.   CHG Shower Instructions:  Wash your face and private area with normal soap. If you choose to  wash your hair, wash first with your normal shampoo.  After you use shampoo/soap, rinse your hair and body thoroughly to remove shampoo/soap residue.  Turn the water  OFF and apply half the bottle of CHG soap to a CLEAN washcloth.  Apply CHG soap ONLY FROM YOUR NECK DOWN TO YOUR TOES (washing for 3-5 minutes)  DO NOT use CHG soap on face, private areas, open wounds, or sores.  Pay special attention to the area where your surgery is being performed.  If you are having back surgery, having someone wash your back for you may be helpful. Wait 2 minutes after CHG soap is applied, then you may rinse off the CHG soap.  Pat dry with a clean towel  Put on clean pajamas     Additional instructions for the day of surgery: DO NOT APPLY any lotions, deodorants, cologne, or perfumes.   Do not wear jewelry or makeup Do not wear nail polish, gel polish, artificial nails, or any other type of covering on natural nails (fingers and toes) Do not bring valuables to the hospital. [facility] is not responsible for valuables/personal belongings. Put on clean/comfortable clothes.  Please brush your teeth.  Ask your nurse before applying any prescription medications to the skin

## 2023-08-29 NOTE — Telephone Encounter (Signed)
 Remote ICM transmission received.  Attempted call to patient regarding ICM remote transmission and no answer.

## 2023-08-29 NOTE — Anesthesia Postprocedure Evaluation (Signed)
 Anesthesia Post Note  Patient: Erin  J [name]  Procedure(s) Performed: COLONOSCOPY  Patient location during evaluation: Phase II Anesthesia Type: General Level of consciousness: awake Pain management: pain level controlled Vital Signs Assessment: post-procedure vital signs reviewed and stable Respiratory status: spontaneous breathing and respiratory function stable Cardiovascular status: blood pressure returned to baseline and stable Postop Assessment: no headache and no apparent nausea or vomiting Anesthetic complications: no Comments: Late entry   No notable events documented.   Last Vitals:  Vitals:   08/28/23 0838 08/28/23 1131  BP: (!) 86/60 (!) 89/60  Pulse: 67 81  Resp: 17 19  Temp: 36.6 C (!) 36.2 C  SpO2: 100% 100%    Last Pain:  Vitals:   08/28/23 1131  TempSrc: Oral  PainSc: 0-No pain                 Coretha Dew

## 2023-08-29 NOTE — Progress Notes (Addendum)
 EPIC Encounter for ICM Monitoring  Patient Name: Erin [name] is a 63 y.o. female Date: 08/29/2023 Primary Care Physican: [name], MD Primary Cardiologist: Mitzie Anda Electrophysiologist: Lawana Pray 08/11/2021 Office Weight: 121 lbs. 03/25/2022 Office Weight 125-127 lbs 03/13/2023 Weight: 110 lbs 03/24/2023 Weight: 115 lbs 05/26/2023 Weight: 105 lbs 07/10/2023 Weight: 103 lbs                                                          Attempted call to patient and unable to reach.    Transmission results reviewed.   Lobectomy scheduled for 4/24   Optivol thoracic impedance suggesting possible fluid accumulation starting 4/18.   Prescribed: Furosemide  20 mg Take 2 tablets (40 mg total) by mouth every AM and 1 tablet (20 mg total) every evening Potassium 20 mEq take 2 tablets (40 mEq total) by mouth twice a day Spironolactone  25 mg take 0.5 tablet (12.5 mg total) by mouth daily   Labs: 08/15/2023 Creatinine 1.58, BUN 25, Potassium 4.6, Sodium 136  07/31/2023 Creatinine 1.48, BUN 21, Potassium 3.9, Sodium 138, GFR 40  07/18/2023 Creatinine 1.38, BUN 16, Potassium 4.2, Sodium 140  07/08/2023 Creatinine 1.7,   BUN 22, Potassium 3.8, Sodium 140, GFR 34, BNP 491 07/07/2023 Creatinine 1.38, BUN 23, Potassium 3.7, Sodium 135, GFR 44  06/18/2023 Creatinine 1.19, BUN 13, Potassium 3.5, Sodium 138, GFR 52  05/26/2023 Creatinine 1.85, BUN 16, Potassium 5.0, Sodium 133, GFR 31 A complete set of results can be found in Results Review.   Recommendations:  Unable to reach.     Follow-up plan: ICM clinic phone appointment on 09/11/2023 to recheck fluid levels.   91 day device clinic remote transmission 11/15/2023.     EP/Cardiology Office Visits:  11/14/2023 with [facility] Clinic.  Recall 02/18/2024 with Michaelle Adolphus, PA.     Copy of ICM check sent to Dr. Lawana Pray.    3 month ICM trend: 08/28/2023.    12-14 Month ICM trend:     Almyra Jain, RN 08/29/2023 11:07 AM

## 2023-08-30 ENCOUNTER — Ambulatory Visit (HOSPITAL_COMMUNITY)
Admission: RE | Admit: 2023-08-30 | Discharge: 2023-08-30 | Disposition: A | Discharge status: Home / Self Care | Source: Ambulatory Visit | Attending: Internal Medicine | Admitting: Internal Medicine

## 2023-08-30 DIAGNOSIS — R1013 Epigastric pain: Secondary | ICD-10-CM | POA: Insufficient documentation

## 2023-08-30 DIAGNOSIS — R634 Abnormal weight loss: Secondary | ICD-10-CM | POA: Insufficient documentation

## 2023-08-30 MED ORDER — IOHEXOL 350 MG/ML SOLN
75.0000 mL | Freq: Once | INTRAVENOUS | Status: AC | PRN
  Administered 2023-08-30: 75 mL via INTRAVENOUS

## 2023-08-30 NOTE — Progress Notes (Signed)
 Anesthesia Chart Review:  Case: [id number] Date/Time: 08/31/23 0745   Procedure: LOBECTOMY, LUNG, ROBOT-ASSISTED, USING VATS (Left: Chest) - Left Robotic Assisted Thoracoscopy for Left Upper Lobectomy   Anesthesia type: General   Diagnosis: Malignant neoplasm of upper lobe bronchus, left (HCC) [C34.12]   Pre-op diagnosis: lung cancer   [facility] / [facility]   Surgeons: Hilarie Lovely, MD       DISCUSSION:  Patient is a 63 year old female scheduled for the above procedure.  Case initially scheduled for 08/17/2023 but rescheduled for 08/31/2023. She had a slight asymmetric area of uptake along the anorectal region on 08/07/23 PET scan, so underwent colonoscopy on 08/28/23 that showed non-bleeding internal hemorrhoids, otherwise normal exam.  History includes former smoker (60 pack years), smoking, HTN, HLD, CAD (NSTEMI, s/p DES prox-mid CX 03/11/16, DES RCA x2 03/14/16), ischemic cardiomyopathy, chronic combined systolic and diastolic CHF, ICD (Medtronic [id number] Visia AF MRI VR ICD 04/03/20), CKD, GERD, cerebral aneurysm, thrombocytosis, lung cancer (LUL biopsy: adenocarcinoma 07/03/23), skin cancer (SCC, BCC s/p MOHS 07/05/23), alpha gal allergy, anemia, osteoarthritis (left reverse TSA 03/21/23).    She had HF cardiology follow-up with Dr. Mitzie Anda on 08/15/2023. "I think she is a reasonable candidate for surgery. Moderately increased risk due to cardiomyopathy. She is NYHA class II and not volume overloaded. No ischemic symptoms and recent Cardiolite  showed old infarction, no ischemia. She can hold Plavix , Farxiga , and ASA as requested prior to surgery, restart afterwards." See recent testing below.   She had EP cardiology follow-up with Dr. Isidoro Margarita on 08/22/23. Euvolemic on exam. Normal ICD function. He added, "Patient would be a candidate for CCM.  She is undergoing therapy for lung cancer.  Also needs a colonoscopy and potentially has a mass in her colon.  Will reassess after therapy has  completed." Six month follow-up planned.  Perioperative EP Recommendations for ICD (as of 08/14/23): Device Information: Clinic EP Physician:  Agatha Horsfall, MD  Device Type:  Defibrillator Manufacturer and Phone #:  Medtronic: [id number] Pacemaker Dependent?:  No. Date of Last Device Check:  07/18/23         Normal Device Function?:  Yes.     Electrophysiologist's Recommendations: Have magnet available. Provide continuous ECG monitoring when magnet is used or reprogramming is to be performed.  Procedure will likely interfere with device function.  Device should be programmed:  Tachy therapies disabled     Anesthesia team to evaluate on the day of surgery.  TCTS advised hold Plavix  5 days prior to surgery and last Farxiga  3 days prior to surgery. She reported last Plavix  08/22/23 and last ASA 08/22/23.    VS: BP 106/77   Pulse 79   Temp 36.8 C   Resp 18   Ht 5' (1.524 m)   Wt 49.9 kg   SpO2 100%   BMI 21.48 kg/m   PROVIDERS: [name], MD is PCP  Peder Bourdon, MD is HF cardiologist Agatha Horsfall, MD is EP cardiologist Rheba Cedar, MD is GI   LABS: Preoperative labs noted. H/H 10.0/31.2, previously 10.7/33.3 on 08/14/23. (all labs ordered are listed, but only abnormal results are displayed)  Labs Reviewed  CBC - Abnormal; Notable for the following components:      Result Value   RBC 3.80 (*)    Hemoglobin 10.0 (*)    HCT 31.2 (*)    RDW 16.8 (*)    All other components within normal limits  COMPREHENSIVE METABOLIC PANEL WITH GFR - Abnormal; Notable  for the following components:   Creatinine, Ser 1.29 (*)    Total Protein 6.0 (*)    Alkaline Phosphatase 28 (*)    GFR, Estimated 47 (*)    All other components within normal limits  PROTIME-INR  APTT  URINALYSIS, ROUTINE W REFLEX MICROSCOPIC  TYPE AND SCREEN     PFTs 08/09/2023: FVC 2.37 (84%), post 2.43 (86%). FEV1 1.54 (71%), post 1.81 (83%), DLCO unc 9.83 (56%), cor 10.30 (58%).     IMAGES: CXR  08/29/2023:   FINDINGS: Left-sided pacemaker in placethe cardiomediastinal contours are normal. Left lung fiducial marker. Pulmonary vasculature is normal. No consolidation, pleural effusion, or pneumothorax. No acute osseous abnormalities are seen. IMPRESSION: No acute chest findings. Left lung fiducial marker.   MRI Brain 08/25/2023: IMPRESSION: 1. No evidence of intracranial metastatic disease. 2. Known small chronic cortical/subcortical left parieto-occipital infarct. 3. Mild generalized cerebral atrophy.   PET Scan 07/20/2023: IMPRESSION: - Ground-glass like area at the left lung apex is again seen and similar to previous CT scan. There is now fiduciary marker in this location. No specific abnormal radiotracer uptake. Please correlate with results from prior intervention. Otherwise based on today's examination, simple follow up is recommended in 3 months with CT. - Slight asymmetric area of uptake along the anorectal region. This could be a physiologic variant but please correlate with clinical findings to exclude lesion. - Incidental findings identified including bilateral adrenal adenomas. - 3.1 cm infrarenal abdominal aortic aneurysm. Recommend follow-up ultrasound every 3 years. (Ref.: J Vasc Surg. 2018; 67:2-77 and J Am Coll Radiol 2013;10(10):789-794.)   CT Abd/pelvis 06/18/23: IMPRESSION: 1. No acute findings in the abdomen or pelvis. Specifically, no findings to explain the patient's history of intermittent abdominal pain with vomiting. 2. Stable bilateral adrenal nodules, left greater than right and previously characterized as adenomas (MRI 01/06/2023). 3. Stable tiny hypoattenuating lesions in the liver, previously characterized as benign adrenal adenomas on MRI abdomen 01/06/2023 and stable since that time. 4. Juxtarenal 3.2 cm abdominal aortic aneurysm with accessory renal arteries noted bilaterally. 5.  Aortic Atherosclerosis (ICD10-I70.0).     EKG:  08/15/2023: Normal sinus rhythm Possible Anterior infarct , age undetermined Abnormal ECG     CV: Nuclear stress tet 08/14/2023:   Findings are consistent with infarction. The study is high risk.   No ST deviation was noted.   LV perfusion is abnormal. There is no evidence of ischemia. There is evidence of infarction. Defect 1: There is a medium defect with severe reduction in uptake present in the apical to basal inferior and inferolateral location(s) that is fixed. There is abnormal wall motion in the defect area. Consistent with infarction.   Left ventricular function is abnormal. Nuclear stress EF: 35%. The left ventricular ejection fraction is moderately decreased (30-44%). End diastolic cavity size is mildly enlarged.   Prior study available for comparison from 03/07/2019.   Severe fixed perfusion defect in apical to basal inferior/inferolateral walls, with hypokinesis in this region consistent with prior infarct LVEF 35% High risk study due to systolic dysfunction and large fixed perfusion defect.  No ischemia     TTE 08/03/2023: IMPRESSIONS   1. Left ventricular ejection fraction, by estimation, is 35 to 40%. The  left ventricle has moderately decreased function. The left ventricle  demonstrates global hypokinesis. Left ventricular diastolic parameters are  consistent with Grade I diastolic  dysfunction (impaired relaxation). The average left ventricular global  longitudinal strain is -12.3 %. The global longitudinal strain is  abnormal.  2. Right ventricular systolic function is normal. The right ventricular  size is normal. There is normal pulmonary artery systolic pressure. The  estimated right ventricular systolic pressure is 23.8 mmHg.   3. Left atrial size was mildly dilated.   4. The mitral valve is normal in structure. Trivial mitral valve  regurgitation. No evidence of mitral stenosis.   5. The aortic valve is tricuspid. There is mild calcification of the  aortic  valve. Aortic valve regurgitation is not visualized. No aortic  stenosis is present.   6. The inferior vena cava is normal in size with greater than 50%  respiratory variability, suggesting right atrial pressure of 3 mmHg.   7. A small pericardial effusion is present.  - Comparison 06/13/22: LVEF 30-35%, basal to mid  inferolateral and anterolateral akinesis, grade 1 DD, RVSP 28.4 mmHg, mild MR     Carotid duplex 04/28/2023: Summary:  - Right Carotid: Velocities in the right ICA are consistent with a 1-39% stenosis.  - Left Carotid: Velocities in the left ICA are consistent with a 40-59% stenosis.  Cannot rule out higher stenosis due to circumferential shadowing plaque in the proximal LICA.  - Vertebrals:  Bilateral vertebral arteries demonstrate antegrade flow.  - Subclavians: Normal flow hemodynamics were seen in bilateral subclavian arteries.      Cath 08/19/2019: 1. Normal filling pressures.  2. Preserved cardiac output.  3. No obstructive CAD, patent stents.  No explanation for chest pain, ?vasospasm.      Cardiac event monitor 03/24/2017-04/22/2017: Sinus rhythm, sinus tachycardia, and PVCs seen. Symptoms correlated with all of the above.   Past Medical History:  Diagnosis Date   AICD (automatic cardioverter/defibrillator) present    Allergic reaction to alpha-gal    Allergy 03/05/2022   Anemia    Anxiety state 03/25/2016   Arthritis    Basal cell carcinoma of forehead    Brain aneurysm    CHF (congestive heart failure) (HCC)    Coronary artery disease    a. 03/11/16 PCI with DES-->Prox/Mid Cx;  b. 03/14/16 PCI with DES x2-->RCA, EF 30-35%.   Depression 06/09/2010   Encounter for general adult medical examination with abnormal findings 05/04/2022   Essential hypertension    Hx   GERD (gastroesophageal reflux disease)    HFrEF (heart failure with reduced ejection fraction) (HCC)    a. 10/2016 Echo: EF 35-40%, Gr1 DD, mild focal basal septal hypertrophy, basal inflat, mid  inflat, basal antlat AK. Mid infept/inf/antlat, apical lateral sev HK. Mod MR. mildly reduced RV fxn. Mild TR.   History of pneumonia    Hyperlipidemia    IBS (irritable bowel syndrome)    Ischemic cardiomyopathy    a. 10/2016 Echo: EF 35-40%, Gr1 DD.   Lung cancer [facility])    Mitral regurgitation    Neuromuscular disorder (HCC)    NSTEMI (non-ST elevated myocardial infarction) (HCC) 03/10/2016   Pneumonia 03/2016   Shingles    Squamous cell cancer of skin of nose    Thrombocytosis 03/26/2016   Tobacco abuse    Trichimoniasis    Wears dentures    Wears glasses     Past Surgical History:  Procedure Laterality Date   APPENDECTOMY     BICEPT TENODESIS Left 03/21/2023   Procedure: BICEPS TENODESIS;  Surgeon: Jasmine Mesi, MD;  Location: [facility] OR;  Service: Orthopedics;  Laterality: Left;   BIOPSY  09/20/2018   Procedure: BIOPSY;  Surgeon: Suzette Espy, MD;  [facility];  Service: Endoscopy;;  colon  BIOPSY  01/05/2021   Procedure: BIOPSY;  Surgeon: Janel Medford, MD;  Location: [facility] ENDOSCOPY;  Service: Endoscopy;;   BIOPSY  04/27/2022   Procedure: BIOPSY;  Surgeon: Vinetta Greening, DO;  [facility];  Service: Endoscopy;;   BRONCHIAL BIOPSY  07/03/2023   Procedure: BRONCHIAL BIOPSIES;  Surgeon: Denson Flake, MD;  Location: [facility] ENDOSCOPY;  Service: Pulmonary;;   BRONCHIAL BRUSHINGS  07/03/2023   Procedure: BRONCHIAL BRUSHINGS;  Surgeon: Denson Flake, MD;  Location: [facility] ENDOSCOPY;  Service: Pulmonary;;   BRONCHIAL NEEDLE ASPIRATION BIOPSY  07/03/2023   Procedure: BRONCHIAL NEEDLE ASPIRATION BIOPSIES;  Surgeon: Denson Flake, MD;  Location: [facility] ENDOSCOPY;  Service: Pulmonary;;   BRONCHIAL WASHINGS  07/03/2023   Procedure: BRONCHIAL WASHINGS;  Surgeon: Denson Flake, MD;  Location: [facility] ENDOSCOPY;  Service: Pulmonary;;   CARDIAC CATHETERIZATION N/A 03/11/2016   Procedure: Left Heart Cath and Coronary Angiography;  Surgeon: Arleen Lacer, MD;   Location: [facility] INVASIVE CV LAB;  Service: Cardiovascular;  Laterality: N/A;   CARDIAC CATHETERIZATION N/A 03/11/2016   Procedure: Coronary Stent Intervention;  Surgeon: Arleen Lacer, MD;  Location: [facility] INVASIVE CV LAB;  Service: Cardiovascular;  Laterality: N/A;   CARDIAC CATHETERIZATION N/A 03/14/2016   Procedure: Coronary Stent Intervention;  Surgeon: Peter M Swaziland, MD;  Location: [facility] INVASIVE CV LAB;  Service: Cardiovascular;  Laterality: N/A;   CEREBRAL ANEURYSM REPAIR  04/2019   stent placed   CHOLECYSTECTOMY OPEN  1984   COLONOSCOPY N/A 08/28/2023   Procedure: COLONOSCOPY;  Surgeon: Vinetta Greening, DO;  [facility];  Service: Endoscopy;  Laterality: N/A;  1030am, asa 3, has defibrillator   COLONOSCOPY WITH PROPOFOL  N/A 09/20/2018   Procedure: COLONOSCOPY WITH PROPOFOL ;  Surgeon: Suzette Espy, MD;  [facility];  Service: Endoscopy;  Laterality: N/A;  10:30am   CORONARY ANGIOPLASTY WITH STENT PLACEMENT  03/14/2016   ESOPHAGOGASTRODUODENOSCOPY (EGD) WITH PROPOFOL  N/A 09/20/2018   Procedure: ESOPHAGOGASTRODUODENOSCOPY (EGD) WITH PROPOFOL ;  Surgeon: Suzette Espy, MD;  [facility];  Service: Endoscopy;  Laterality: N/A;   ESOPHAGOGASTRODUODENOSCOPY (EGD) WITH PROPOFOL  N/A 01/05/2021   Procedure: ESOPHAGOGASTRODUODENOSCOPY (EGD) WITH PROPOFOL ;  Surgeon: Janel Medford, MD;  Location: [facility] ENDOSCOPY;  Service: Endoscopy;  Laterality: N/A;   ESOPHAGOGASTRODUODENOSCOPY (EGD) WITH PROPOFOL  N/A 04/27/2022   Procedure: ESOPHAGOGASTRODUODENOSCOPY (EGD) WITH PROPOFOL ;  Surgeon: Vinetta Greening, DO;  [facility];  Service: Endoscopy;  Laterality: N/A;  9:15am, asa 3/4, ASAP   ESOPHAGOGASTRODUODENOSCOPY (EGD) WITH PROPOFOL  N/A 01/04/2023   Procedure: ESOPHAGOGASTRODUODENOSCOPY (EGD) WITH PROPOFOL ;  Surgeon: Suzette Espy, MD;  [facility];  Service: Endoscopy;  Laterality: N/A;  130pm, asa 3   FIDUCIAL MARKER PLACEMENT  07/03/2023    Procedure: FIDUCIAL MARKER PLACEMENT;  Surgeon: Denson Flake, MD;  Location: [facility] ENDOSCOPY;  Service: Pulmonary;;   FINGER ARTHROPLASTY Left 05/14/2013   Procedure: LEFT THUMB CARPAL METACARPAL ARTHROPLASTY;  Surgeon: Milagros Alf, MD;  Location:  SURGERY CENTER;  Service: Orthopedics;  Laterality: Left;   ICD IMPLANT N/A 04/03/2020   Procedure: ICD IMPLANT;  Surgeon: Lei Pump, MD;  Location: [facility] INVASIVE CV LAB;  Service: Cardiovascular;  Laterality: N/A;   IR ANGIO INTRA EXTRACRAN SEL COM CAROTID INNOMINATE BILAT MOD SED  01/05/2017   IR ANGIO INTRA EXTRACRAN SEL COM CAROTID INNOMINATE BILAT MOD SED  03/19/2019   IR ANGIO INTRA EXTRACRAN SEL COM CAROTID INNOMINATE BILAT MOD SED  06/04/2020   IR ANGIO  INTRA EXTRACRAN SEL INTERNAL CAROTID UNI L MOD SED  04/15/2019   IR ANGIO VERTEBRAL SEL VERTEBRAL BILAT MOD SED  01/05/2017   IR ANGIO VERTEBRAL SEL VERTEBRAL BILAT MOD SED  03/19/2019   IR ANGIO VERTEBRAL SEL VERTEBRAL UNI L MOD SED  06/04/2020   IR ANGIOGRAM FOLLOW UP STUDY  04/15/2019   IR RADIOLOGIST EVAL & MGMT  12/30/2016   IR TRANSCATH/EMBOLIZ  04/15/2019   IR US  GUIDE VASC ACCESS RIGHT  03/19/2019   IR US  GUIDE VASC ACCESS RIGHT  06/04/2020   JOINT REPLACEMENT     MALONEY DILATION N/A 09/20/2018   Procedure: Londa Rival DILATION;  Surgeon: Suzette Espy, MD;  [facility];  Service: Endoscopy;  Laterality: N/A;   MALONEY DILATION N/A 01/04/2023   Procedure: Londa Rival DILATION;  Surgeon: Suzette Espy, MD;  [facility];  Service: Endoscopy;  Laterality: N/A;   RADIOLOGY WITH ANESTHESIA N/A 04/15/2019   Procedure: Rich Champ;  Surgeon: Luellen Sages, MD;  Location: [facility] OR;  Service: Radiology;  Laterality: N/A;   REVERSE SHOULDER ARTHROPLASTY Left 03/21/2023   Procedure: LEFT REVERSE SHOULDER ARTHROPLASTY;  Surgeon: Jasmine Mesi, MD;  Location: [facility] OR;  Service: Orthopedics;  Laterality: Left;   RIGHT/LEFT HEART CATH AND CORONARY  ANGIOGRAPHY N/A 08/19/2019   Procedure: RIGHT/LEFT HEART CATH AND CORONARY ANGIOGRAPHY;  Surgeon: Darlis Eisenmenger, MD;  Location: [facility] INVASIVE CV LAB;  Service: Cardiovascular;  Laterality: N/A;   TUBAL LIGATION  1987   VAGINAL HYSTERECTOMY  2009    MEDICATIONS:  aspirin  EC 81 MG tablet   atorvastatin  (LIPITOR ) 80 MG tablet   clopidogrel  (PLAVIX ) 75 MG tablet   dapagliflozin  propanediol (FARXIGA ) 10 MG TABS tablet   EPINEPHrine  (EPIPEN  2-PAK) 0.3 mg/0.3 mL IJ SOAJ injection   Evolocumab  (REPATHA  SURECLICK) 140 MG/ML SOAJ   ezetimibe  (ZETIA ) 10 MG tablet   fenofibrate  (TRICOR ) 145 MG tablet   furosemide  (LASIX ) 20 MG tablet   gabapentin  (NEURONTIN ) 300 MG capsule   GAVILYTE-N  WITH FLAVOR PACK 420 g solution   hydrOXYzine  (VISTARIL ) 25 MG capsule   linaclotide  (LINZESS ) 145 MCG CAPS capsule   losartan  (COZAAR ) 25 MG tablet   MAGNESIUM -OXIDE 400 (240 Mg) MG tablet   metoprolol  succinate (TOPROL  XL) 25 MG 24 hr tablet   Multiple Vitamins-Minerals (MULTIVITAMIN WITH MINERALS) tablet   nicotine  (NICODERM CQ  - DOSED IN MG/24 HOURS) 21 mg/24hr patch   nitroGLYCERIN  (NITROSTAT ) 0.4 MG SL tablet   ondansetron  (ZOFRAN ) 8 MG tablet   pantoprazole  (PROTONIX ) 40 MG tablet   potassium chloride  SA (KLOR-CON  M) 20 MEQ tablet   rOPINIRole  (REQUIP ) 0.5 MG tablet   sertraline  (ZOLOFT ) 50 MG tablet   spironolactone  (ALDACTONE ) 25 MG tablet   traZODone  (DESYREL ) 50 MG tablet   Vitamin D , Ergocalciferol , (DRISDOL) 1.25 MG (50000 UNIT) CAPS capsule   No current facility-administered medications for this encounter.    Ella Gun, PA-C Surgical Short Stay/Anesthesiology [facility] Phone [phone] [facility] Phone [phone] 08/30/2023 12:53 PM

## 2023-08-31 ENCOUNTER — Inpatient Hospital Stay (HOSPITAL_COMMUNITY)
Admission: RE | Admit: 2023-08-31 | Discharge: 2023-09-02 | DRG: 629 | Disposition: A | Discharge status: Home / Self Care | Attending: Thoracic Surgery (Cardiothoracic Vascular Surgery) | Admitting: Thoracic Surgery (Cardiothoracic Vascular Surgery)

## 2023-08-31 ENCOUNTER — Inpatient Hospital Stay (HOSPITAL_COMMUNITY): Payer: Self-pay | Admitting: Anesthesiology

## 2023-08-31 ENCOUNTER — Other Ambulatory Visit: Payer: Self-pay

## 2023-08-31 ENCOUNTER — Inpatient Hospital Stay (HOSPITAL_COMMUNITY)

## 2023-08-31 ENCOUNTER — Encounter (HOSPITAL_COMMUNITY)
Admission: RE | Disposition: A | Discharge status: Home / Self Care | Payer: Self-pay | Source: Home / Self Care | Attending: Thoracic Surgery (Cardiothoracic Vascular Surgery)

## 2023-08-31 ENCOUNTER — Encounter (HOSPITAL_COMMUNITY): Payer: Self-pay | Admitting: Thoracic Surgery (Cardiothoracic Vascular Surgery)

## 2023-08-31 DIAGNOSIS — Z7902 Long term (current) use of antithrombotics/antiplatelets: Secondary | ICD-10-CM

## 2023-08-31 DIAGNOSIS — Z85828 Personal history of other malignant neoplasm of skin: Secondary | ICD-10-CM | POA: Diagnosis not present

## 2023-08-31 DIAGNOSIS — Z90710 Acquired absence of both cervix and uterus: Secondary | ICD-10-CM

## 2023-08-31 DIAGNOSIS — D3501 Benign neoplasm of right adrenal gland: Secondary | ICD-10-CM | POA: Diagnosis present

## 2023-08-31 DIAGNOSIS — E860 Dehydration: Secondary | ICD-10-CM | POA: Diagnosis present

## 2023-08-31 DIAGNOSIS — J9382 Other air leak: Secondary | ICD-10-CM | POA: Diagnosis not present

## 2023-08-31 DIAGNOSIS — I252 Old myocardial infarction: Secondary | ICD-10-CM | POA: Diagnosis not present

## 2023-08-31 DIAGNOSIS — K219 Gastro-esophageal reflux disease without esophagitis: Secondary | ICD-10-CM | POA: Diagnosis present

## 2023-08-31 DIAGNOSIS — Z7984 Long term (current) use of oral hypoglycemic drugs: Secondary | ICD-10-CM

## 2023-08-31 DIAGNOSIS — Z818 Family history of other mental and behavioral disorders: Secondary | ICD-10-CM

## 2023-08-31 DIAGNOSIS — Z7982 Long term (current) use of aspirin: Secondary | ICD-10-CM

## 2023-08-31 DIAGNOSIS — Z825 Family history of asthma and other chronic lower respiratory diseases: Secondary | ICD-10-CM

## 2023-08-31 DIAGNOSIS — I255 Ischemic cardiomyopathy: Secondary | ICD-10-CM | POA: Diagnosis present

## 2023-08-31 DIAGNOSIS — I340 Nonrheumatic mitral (valve) insufficiency: Secondary | ICD-10-CM | POA: Diagnosis present

## 2023-08-31 DIAGNOSIS — Z8261 Family history of arthritis: Secondary | ICD-10-CM

## 2023-08-31 DIAGNOSIS — Z833 Family history of diabetes mellitus: Secondary | ICD-10-CM

## 2023-08-31 DIAGNOSIS — Z87891 Personal history of nicotine dependence: Secondary | ICD-10-CM

## 2023-08-31 DIAGNOSIS — D3502 Benign neoplasm of left adrenal gland: Secondary | ICD-10-CM | POA: Diagnosis present

## 2023-08-31 DIAGNOSIS — Z800 Family history of malignant neoplasm of digestive organs: Secondary | ICD-10-CM

## 2023-08-31 DIAGNOSIS — I7143 Infrarenal abdominal aortic aneurysm, without rupture: Secondary | ICD-10-CM | POA: Diagnosis present

## 2023-08-31 DIAGNOSIS — I2510 Atherosclerotic heart disease of native coronary artery without angina pectoris: Secondary | ICD-10-CM | POA: Diagnosis present

## 2023-08-31 DIAGNOSIS — Z83719 Family history of colon polyps, unspecified: Secondary | ICD-10-CM

## 2023-08-31 DIAGNOSIS — Z9049 Acquired absence of other specified parts of digestive tract: Secondary | ICD-10-CM

## 2023-08-31 DIAGNOSIS — I130 Hypertensive heart and chronic kidney disease with heart failure and stage 1 through stage 4 chronic kidney disease, or unspecified chronic kidney disease: Secondary | ICD-10-CM | POA: Diagnosis present

## 2023-08-31 DIAGNOSIS — M1990 Unspecified osteoarthritis, unspecified site: Secondary | ICD-10-CM | POA: Diagnosis present

## 2023-08-31 DIAGNOSIS — Z955 Presence of coronary angioplasty implant and graft: Secondary | ICD-10-CM | POA: Diagnosis not present

## 2023-08-31 DIAGNOSIS — K589 Irritable bowel syndrome without diarrhea: Secondary | ICD-10-CM | POA: Diagnosis present

## 2023-08-31 DIAGNOSIS — Z803 Family history of malignant neoplasm of breast: Secondary | ICD-10-CM

## 2023-08-31 DIAGNOSIS — Z888 Allergy status to other drugs, medicaments and biological substances status: Secondary | ICD-10-CM

## 2023-08-31 DIAGNOSIS — C3412 Malignant neoplasm of upper lobe, left bronchus or lung: Secondary | ICD-10-CM | POA: Diagnosis present

## 2023-08-31 DIAGNOSIS — K648 Other hemorrhoids: Secondary | ICD-10-CM | POA: Diagnosis present

## 2023-08-31 DIAGNOSIS — E785 Hyperlipidemia, unspecified: Principal | ICD-10-CM | POA: Diagnosis present

## 2023-08-31 DIAGNOSIS — Z8249 Family history of ischemic heart disease and other diseases of the circulatory system: Secondary | ICD-10-CM

## 2023-08-31 DIAGNOSIS — Z79899 Other long term (current) drug therapy: Secondary | ICD-10-CM

## 2023-08-31 DIAGNOSIS — Z91014 Allergy to mammalian meats: Secondary | ICD-10-CM | POA: Diagnosis not present

## 2023-08-31 DIAGNOSIS — I5042 Chronic combined systolic (congestive) and diastolic (congestive) heart failure: Secondary | ICD-10-CM

## 2023-08-31 DIAGNOSIS — I110 Hypertensive heart disease with heart failure: Secondary | ICD-10-CM

## 2023-08-31 DIAGNOSIS — I493 Ventricular premature depolarization: Secondary | ICD-10-CM | POA: Diagnosis not present

## 2023-08-31 DIAGNOSIS — Z8673 Personal history of transient ischemic attack (TIA), and cerebral infarction without residual deficits: Secondary | ICD-10-CM

## 2023-08-31 DIAGNOSIS — Z902 Acquired absence of lung [part of]: Principal | ICD-10-CM

## 2023-08-31 DIAGNOSIS — Z95810 Presence of automatic (implantable) cardiac defibrillator: Secondary | ICD-10-CM | POA: Diagnosis not present

## 2023-08-31 DIAGNOSIS — Z8701 Personal history of pneumonia (recurrent): Secondary | ICD-10-CM

## 2023-08-31 DIAGNOSIS — Z8349 Family history of other endocrine, nutritional and metabolic diseases: Secondary | ICD-10-CM

## 2023-08-31 DIAGNOSIS — Z96612 Presence of left artificial shoulder joint: Secondary | ICD-10-CM | POA: Diagnosis present

## 2023-08-31 DIAGNOSIS — Z823 Family history of stroke: Secondary | ICD-10-CM

## 2023-08-31 HISTORY — PX: LYMPH NODE BIOPSY: SHX201

## 2023-08-31 HISTORY — PX: INTERCOSTAL NERVE BLOCK: SHX5021

## 2023-08-31 HISTORY — PX: LOBECTOMY, LUNG, ROBOT-ASSISTED, USING VATS: SHX7607

## 2023-08-31 SURGERY — LOBECTOMY, LUNG, ROBOT-ASSISTED, USING VATS
Anesthesia: General | Site: Chest | Laterality: Left

## 2023-08-31 MED ORDER — BUPIVACAINE HCL (PF) 0.5 % IJ SOLN
INTRAMUSCULAR | Status: AC
  Filled 2023-08-31: qty 30

## 2023-08-31 MED ORDER — ADULT MULTIVITAMIN W/MINERALS CH
1.0000 | ORAL_TABLET | Freq: Every day | ORAL | Status: DC
  Administered 2023-09-01 – 2023-09-02 (×2): 1 via ORAL
  Filled 2023-08-31 (×2): qty 1

## 2023-08-31 MED ORDER — ROCURONIUM BROMIDE 10 MG/ML (PF) SYRINGE
PREFILLED_SYRINGE | INTRAVENOUS | Status: AC
  Filled 2023-08-31: qty 10

## 2023-08-31 MED ORDER — OXYCODONE HCL 5 MG/5ML PO SOLN: 5.0000 mg | Freq: Once | ORAL | Status: DC | PRN

## 2023-08-31 MED ORDER — FENTANYL CITRATE (PF) 100 MCG/2ML IJ SOLN: 25.0000 ug | INTRAMUSCULAR | Status: DC | PRN

## 2023-08-31 MED ORDER — ALBUMIN HUMAN 5 % IV SOLN: INTRAVENOUS | Status: DC | PRN

## 2023-08-31 MED ORDER — BUPIVACAINE HCL (PF) 0.5 % IJ SOLN
INTRAMUSCULAR | Status: AC
Start: 2023-08-31 — End: ?
  Filled 2023-08-31: qty 30

## 2023-08-31 MED ORDER — BISACODYL 5 MG PO TBEC
10.0000 mg | DELAYED_RELEASE_TABLET | Freq: Every day | ORAL | Status: DC
  Filled 2023-08-31 (×3): qty 2

## 2023-08-31 MED ORDER — LACTATED RINGERS IV SOLN: INTRAVENOUS | Status: DC | PRN

## 2023-08-31 MED ORDER — ACETAMINOPHEN 10 MG/ML IV SOLN
INTRAVENOUS | Status: AC
  Filled 2023-08-31: qty 200

## 2023-08-31 MED ORDER — LACTATED RINGERS IV SOLN: INTRAVENOUS | Status: DC

## 2023-08-31 MED ORDER — TRAMADOL HCL 50 MG PO TABS: 50.0000 mg | ORAL_TABLET | Freq: Four times a day (QID) | ORAL | Status: DC | PRN

## 2023-08-31 MED ORDER — SODIUM CHLORIDE FLUSH 0.9 % IV SOLN
INTRAVENOUS | Status: DC | PRN
  Administered 2023-08-31: 100 mL

## 2023-08-31 MED ORDER — ACETAMINOPHEN 10 MG/ML IV SOLN
INTRAVENOUS | Status: DC | PRN
  Administered 2023-08-31: 1000 mg via INTRAVENOUS

## 2023-08-31 MED ORDER — ACETAMINOPHEN 500 MG PO TABS
1000.0000 mg | ORAL_TABLET | Freq: Four times a day (QID) | ORAL | Status: DC
  Administered 2023-08-31 – 2023-09-02 (×7): 1000 mg via ORAL
  Filled 2023-08-31 (×8): qty 2

## 2023-08-31 MED ORDER — DEXAMETHASONE SODIUM PHOSPHATE 10 MG/ML IJ SOLN
INTRAMUSCULAR | Status: AC
  Filled 2023-08-31: qty 1

## 2023-08-31 MED ORDER — ASPIRIN 81 MG PO TBEC
81.0000 mg | DELAYED_RELEASE_TABLET | Freq: Every day | ORAL | Status: DC
  Administered 2023-09-01 – 2023-09-02 (×2): 81 mg via ORAL
  Filled 2023-08-31 (×2): qty 1

## 2023-08-31 MED ORDER — ONDANSETRON HCL 4 MG/2ML IJ SOLN: 4.0000 mg | Freq: Four times a day (QID) | INTRAMUSCULAR | Status: DC | PRN

## 2023-08-31 MED ORDER — BUPIVACAINE LIPOSOME 1.3 % IJ SUSP
INTRAMUSCULAR | Status: AC
  Filled 2023-08-31: qty 20

## 2023-08-31 MED ORDER — SUCCINYLCHOLINE CHLORIDE 200 MG/10ML IV SOSY
PREFILLED_SYRINGE | INTRAVENOUS | Status: AC
  Filled 2023-08-31: qty 10

## 2023-08-31 MED ORDER — CHLORHEXIDINE GLUCONATE CLOTH 2 % EX PADS
6.0000 | MEDICATED_PAD | Freq: Every day | CUTANEOUS | Status: DC
  Administered 2023-08-31 – 2023-09-02 (×3): 6 via TOPICAL

## 2023-08-31 MED ORDER — ORAL CARE MOUTH RINSE: 15.0000 mL | Freq: Once | OROMUCOSAL | Status: AC

## 2023-08-31 MED ORDER — PROPOFOL 10 MG/ML IV BOLUS
INTRAVENOUS | Status: AC
  Filled 2023-08-31: qty 20

## 2023-08-31 MED ORDER — ONDANSETRON HCL 4 MG/2ML IJ SOLN: 4.0000 mg | Freq: Once | INTRAMUSCULAR | Status: DC | PRN

## 2023-08-31 MED ORDER — IPRATROPIUM-ALBUTEROL 0.5-2.5 (3) MG/3ML IN SOLN
3.0000 mL | Freq: Four times a day (QID) | RESPIRATORY_TRACT | Status: DC
  Administered 2023-08-31 – 2023-09-02 (×9): 3 mL via RESPIRATORY_TRACT
  Filled 2023-08-31 (×9): qty 3

## 2023-08-31 MED ORDER — ATORVASTATIN CALCIUM 80 MG PO TABS
80.0000 mg | ORAL_TABLET | Freq: Every day | ORAL | Status: DC
Start: 2023-09-01 — End: 2023-09-02
  Administered 2023-09-01: 80 mg via ORAL
  Filled 2023-08-31: qty 1

## 2023-08-31 MED ORDER — TRAZODONE HCL 50 MG PO TABS
50.0000 mg | ORAL_TABLET | Freq: Every evening | ORAL | Status: DC | PRN
  Administered 2023-08-31: 50 mg via ORAL
  Filled 2023-08-31: qty 1

## 2023-08-31 MED ORDER — CEFAZOLIN SODIUM-DEXTROSE 2-4 GM/100ML-% IV SOLN
2.0000 g | INTRAVENOUS | Status: AC
  Administered 2023-08-31: 2 g via INTRAVENOUS
  Filled 2023-08-31: qty 100

## 2023-08-31 MED ORDER — CHLORHEXIDINE GLUCONATE 0.12 % MT SOLN
15.0000 mL | Freq: Once | OROMUCOSAL | Status: AC
  Administered 2023-08-31: 15 mL via OROMUCOSAL
  Filled 2023-08-31: qty 15

## 2023-08-31 MED ORDER — LIDOCAINE 2% (20 MG/ML) 5 ML SYRINGE
INTRAMUSCULAR | Status: AC
  Filled 2023-08-31: qty 5

## 2023-08-31 MED ORDER — SENNOSIDES-DOCUSATE SODIUM 8.6-50 MG PO TABS
1.0000 | ORAL_TABLET | Freq: Every day | ORAL | Status: DC
  Filled 2023-08-31 (×2): qty 1

## 2023-08-31 MED ORDER — PHENYLEPHRINE 80 MCG/ML (10ML) SYRINGE FOR IV PUSH (FOR BLOOD PRESSURE SUPPORT)
PREFILLED_SYRINGE | INTRAVENOUS | Status: DC | PRN
  Administered 2023-08-31: 80 ug via INTRAVENOUS
  Administered 2023-08-31: 40 ug via INTRAVENOUS
  Administered 2023-08-31: 80 ug via INTRAVENOUS
  Administered 2023-08-31: 40 ug via INTRAVENOUS
  Administered 2023-08-31 (×4): 80 ug via INTRAVENOUS

## 2023-08-31 MED ORDER — LOSARTAN POTASSIUM 25 MG PO TABS
12.5000 mg | ORAL_TABLET | Freq: Every day | ORAL | Status: DC
  Administered 2023-09-01: 12.5 mg via ORAL
  Filled 2023-08-31: qty 1

## 2023-08-31 MED ORDER — GLYCOPYRROLATE 0.2 MG/ML IJ SOLN
INTRAMUSCULAR | Status: DC | PRN
  Administered 2023-08-31: .2 mg via INTRAVENOUS

## 2023-08-31 MED ORDER — ONDANSETRON HCL 4 MG/2ML IJ SOLN
INTRAMUSCULAR | Status: AC
  Filled 2023-08-31: qty 2

## 2023-08-31 MED ORDER — DEXAMETHASONE SODIUM PHOSPHATE 10 MG/ML IJ SOLN
INTRAMUSCULAR | Status: DC | PRN
  Administered 2023-08-31: 4 mg via INTRAVENOUS

## 2023-08-31 MED ORDER — SUGAMMADEX SODIUM 200 MG/2ML IV SOLN
INTRAVENOUS | Status: DC | PRN
  Administered 2023-08-31: 200 mg via INTRAVENOUS

## 2023-08-31 MED ORDER — CHLORHEXIDINE GLUCONATE CLOTH 2 % EX PADS: 6.0000 | MEDICATED_PAD | Freq: Every day | CUTANEOUS | Status: DC

## 2023-08-31 MED ORDER — FENTANYL CITRATE (PF) 250 MCG/5ML IJ SOLN
INTRAMUSCULAR | Status: AC
  Filled 2023-08-31: qty 5

## 2023-08-31 MED ORDER — ONDANSETRON HCL 4 MG/2ML IJ SOLN
INTRAMUSCULAR | Status: DC | PRN
  Administered 2023-08-31: 4 mg via INTRAVENOUS

## 2023-08-31 MED ORDER — ACETAMINOPHEN 160 MG/5ML PO SOLN: 1000.0000 mg | Freq: Four times a day (QID) | ORAL | Status: DC

## 2023-08-31 MED ORDER — PHENYLEPHRINE 80 MCG/ML (10ML) SYRINGE FOR IV PUSH (FOR BLOOD PRESSURE SUPPORT)
PREFILLED_SYRINGE | INTRAVENOUS | Status: AC
  Filled 2023-08-31: qty 10

## 2023-08-31 MED ORDER — CEFAZOLIN SODIUM-DEXTROSE 2-4 GM/100ML-% IV SOLN
2.0000 g | Freq: Three times a day (TID) | INTRAVENOUS | Status: AC
  Administered 2023-08-31 – 2023-09-01 (×2): 2 g via INTRAVENOUS
  Filled 2023-08-31 (×2): qty 100

## 2023-08-31 MED ORDER — DAPAGLIFLOZIN PROPANEDIOL 10 MG PO TABS
10.0000 mg | ORAL_TABLET | Freq: Every day | ORAL | Status: DC
  Administered 2023-09-01 – 2023-09-02 (×2): 10 mg via ORAL
  Filled 2023-08-31 (×2): qty 1

## 2023-08-31 MED ORDER — MIDAZOLAM HCL 2 MG/2ML IJ SOLN
INTRAMUSCULAR | Status: DC | PRN
  Administered 2023-08-31: 1 mg via INTRAVENOUS

## 2023-08-31 MED ORDER — ROCURONIUM BROMIDE 10 MG/ML (PF) SYRINGE
PREFILLED_SYRINGE | INTRAVENOUS | Status: DC | PRN
  Administered 2023-08-31 (×2): 10 mg via INTRAVENOUS
  Administered 2023-08-31: 60 mg via INTRAVENOUS

## 2023-08-31 MED ORDER — LIDOCAINE 2% (20 MG/ML) 5 ML SYRINGE
INTRAMUSCULAR | Status: DC | PRN
  Administered 2023-08-31: 100 mg via INTRAVENOUS

## 2023-08-31 MED ORDER — CLOPIDOGREL BISULFATE 75 MG PO TABS
75.0000 mg | ORAL_TABLET | Freq: Every day | ORAL | Status: DC
Start: 2023-09-01 — End: 2023-09-02
  Administered 2023-09-01: 75 mg via ORAL
  Filled 2023-08-31: qty 1

## 2023-08-31 MED ORDER — SERTRALINE HCL 25 MG PO TABS
50.0000 mg | ORAL_TABLET | Freq: Every day | ORAL | Status: DC
  Administered 2023-09-01 – 2023-09-02 (×2): 50 mg via ORAL
  Filled 2023-08-31 (×2): qty 2

## 2023-08-31 MED ORDER — MIDAZOLAM HCL 2 MG/2ML IJ SOLN
INTRAMUSCULAR | Status: AC
  Filled 2023-08-31: qty 2

## 2023-08-31 MED ORDER — PHENYLEPHRINE HCL-NACL 20-0.9 MG/250ML-% IV SOLN
INTRAVENOUS | Status: DC | PRN
  Administered 2023-08-31: 40 ug/min via INTRAVENOUS

## 2023-08-31 MED ORDER — MAGNESIUM OXIDE -MG SUPPLEMENT 400 (240 MG) MG PO TABS
400.0000 mg | ORAL_TABLET | Freq: Every day | ORAL | Status: DC
  Administered 2023-09-01 – 2023-09-02 (×2): 400 mg via ORAL
  Filled 2023-08-31 (×2): qty 1

## 2023-08-31 MED ORDER — GABAPENTIN 300 MG PO CAPS
300.0000 mg | ORAL_CAPSULE | Freq: Every day | ORAL | Status: DC
  Administered 2023-08-31 – 2023-09-01 (×2): 300 mg via ORAL
  Filled 2023-08-31 (×2): qty 1

## 2023-08-31 MED ORDER — NICOTINE 21 MG/24HR TD PT24
21.0000 mg | MEDICATED_PATCH | Freq: Every day | TRANSDERMAL | Status: DC
  Administered 2023-08-31 – 2023-09-02 (×3): 21 mg via TRANSDERMAL
  Filled 2023-08-31 (×3): qty 1

## 2023-08-31 MED ORDER — FENTANYL CITRATE (PF) 250 MCG/5ML IJ SOLN
INTRAMUSCULAR | Status: DC | PRN
Start: 2023-08-31 — End: 2023-08-31
  Administered 2023-08-31: 50 ug via INTRAVENOUS
  Administered 2023-08-31: 100 ug via INTRAVENOUS
  Administered 2023-08-31 (×2): 50 ug via INTRAVENOUS

## 2023-08-31 MED ORDER — FENOFIBRATE 54 MG PO TABS
54.0000 mg | ORAL_TABLET | Freq: Every day | ORAL | Status: DC
  Administered 2023-09-01 – 2023-09-02 (×2): 54 mg via ORAL
  Filled 2023-08-31 (×2): qty 1

## 2023-08-31 MED ORDER — 0.9 % SODIUM CHLORIDE (POUR BTL) OPTIME
TOPICAL | Status: DC | PRN
  Administered 2023-08-31: 1000 mL

## 2023-08-31 MED ORDER — PROPOFOL 10 MG/ML IV BOLUS
INTRAVENOUS | Status: DC | PRN
  Administered 2023-08-31: 40 mg via INTRAVENOUS

## 2023-08-31 MED ORDER — EZETIMIBE 10 MG PO TABS
10.0000 mg | ORAL_TABLET | Freq: Every day | ORAL | Status: DC
  Administered 2023-09-01 – 2023-09-02 (×2): 10 mg via ORAL
  Filled 2023-08-31 (×2): qty 1

## 2023-08-31 MED ORDER — ROPINIROLE HCL 0.5 MG PO TABS
0.5000 mg | ORAL_TABLET | Freq: Every day | ORAL | Status: DC
  Administered 2023-09-01: 0.5 mg via ORAL
  Filled 2023-08-31 (×3): qty 1

## 2023-08-31 MED ORDER — FENTANYL CITRATE PF 50 MCG/ML IJ SOSY
25.0000 ug | PREFILLED_SYRINGE | INTRAMUSCULAR | Status: DC | PRN
  Administered 2023-09-01: 25 ug via INTRAVENOUS
  Administered 2023-09-01 – 2023-09-02 (×2): 50 ug via INTRAVENOUS
  Administered 2023-09-02: 25 ug via INTRAVENOUS
  Filled 2023-08-31 (×4): qty 1

## 2023-08-31 MED ORDER — OXYCODONE HCL 5 MG PO TABS
5.0000 mg | ORAL_TABLET | ORAL | Status: DC | PRN
  Administered 2023-08-31: 5 mg via ORAL
  Administered 2023-09-01 (×2): 10 mg via ORAL
  Administered 2023-09-01: 5 mg via ORAL
  Administered 2023-09-02: 10 mg via ORAL
  Filled 2023-08-31 (×4): qty 2
  Filled 2023-08-31: qty 1

## 2023-08-31 MED ORDER — OXYCODONE HCL 5 MG PO TABS: 5.0000 mg | ORAL_TABLET | Freq: Once | ORAL | Status: DC | PRN

## 2023-08-31 MED ORDER — METOPROLOL SUCCINATE ER 25 MG PO TB24
25.0000 mg | ORAL_TABLET | Freq: Every day | ORAL | Status: DC
  Administered 2023-09-01 – 2023-09-02 (×2): 25 mg via ORAL
  Filled 2023-08-31 (×2): qty 1

## 2023-08-31 MED ORDER — PANTOPRAZOLE SODIUM 40 MG PO TBEC
40.0000 mg | DELAYED_RELEASE_TABLET | Freq: Every day | ORAL | Status: DC
  Administered 2023-08-31 – 2023-09-02 (×3): 40 mg via ORAL
  Filled 2023-08-31 (×3): qty 1

## 2023-08-31 SURGICAL SUPPLY — 69 items
BLADE SURG 11 STRL SS (BLADE) ×1 IMPLANT
CANISTER SUCT 3000ML PPV (MISCELLANEOUS) ×2 IMPLANT
CANNULA REDUCER 12-8 DVNC XI (CANNULA) ×2 IMPLANT
CATH THOR STR 28F SOFT WA (CATHETERS) IMPLANT
CHLORAPREP W/TINT 26 (MISCELLANEOUS) ×1 IMPLANT
CNTNR URN SCR LID CUP LEK RST (MISCELLANEOUS) ×5 IMPLANT
DEFOGGER SCOPE WARMER CLEARIFY (MISCELLANEOUS) ×1 IMPLANT
DERMABOND ADVANCED .7 DNX12 (GAUZE/BANDAGES/DRESSINGS) ×1 IMPLANT
DRAPE ARM DVNC X/XI (DISPOSABLE) ×4 IMPLANT
DRAPE COLUMN DVNC XI (DISPOSABLE) ×1 IMPLANT
DRAPE CV SPLIT W-CLR ANES SCRN (DRAPES) ×1 IMPLANT
DRAPE SURG ORHT 6 SPLT 77X108 (DRAPES) ×1 IMPLANT
ELECTRODE REM PT RTRN 9FT ADLT (ELECTROSURGICAL) ×1 IMPLANT
FORCEPS BPLR LNG DVNC XI (INSTRUMENTS) IMPLANT
FORCEPS CADIERE DVNC XI (FORCEP) IMPLANT
GAUZE KITTNER 4X8 (MISCELLANEOUS) ×1 IMPLANT
GAUZE SPONGE 4X4 12PLY STRL (GAUZE/BANDAGES/DRESSINGS) ×1 IMPLANT
GLOVE BIO SURGEON STRL SZ7.5 (GLOVE) ×2 IMPLANT
GLOVE SURG POLYISO LF SZ8 (GLOVE) ×1 IMPLANT
GOWN STRL REUS W/ TWL LRG LVL3 (GOWN DISPOSABLE) ×2 IMPLANT
GOWN STRL REUS W/ TWL XL LVL3 (GOWN DISPOSABLE) ×2 IMPLANT
GOWN STRL REUS W/TWL 2XL LVL3 (GOWN DISPOSABLE) ×1 IMPLANT
GRASPER TIP-UP FEN DVNC XI (INSTRUMENTS) IMPLANT
HEMOSTAT SURGICEL 2X14 (HEMOSTASIS) ×1 IMPLANT
IRRIGATION STRYKERFLOW (MISCELLANEOUS) IMPLANT
KIT BASIN OR (CUSTOM PROCEDURE TRAY) ×1 IMPLANT
KIT TURNOVER KIT B (KITS) ×1 IMPLANT
NDL 22X1.5 STRL (OR ONLY) (MISCELLANEOUS) ×1 IMPLANT
NEEDLE 22X1.5 STRL (OR ONLY) (MISCELLANEOUS) ×1 IMPLANT
NS IRRIG 1000ML POUR BTL (IV SOLUTION) ×3 IMPLANT
PACK CHEST (CUSTOM PROCEDURE TRAY) ×1 IMPLANT
PAD ARMBOARD POSITIONER FOAM (MISCELLANEOUS) ×5 IMPLANT
RELOAD STAPLE 45 2.0 GRY DVNC (STAPLE) IMPLANT
RELOAD STAPLE 45 2.5 WHT DVNC (STAPLE) IMPLANT
RELOAD STAPLE 45 3.5 BLU DVNC (STAPLE) IMPLANT
RELOAD STAPLE 45 4.3 GRN DVNC (STAPLE) IMPLANT
RELOAD STAPLER 2.5X45 WHT DVNC (STAPLE) ×3 IMPLANT
RELOAD STAPLER 3.5X45 BLU DVNC (STAPLE) ×3 IMPLANT
RELOAD STAPLER 4.3X45 GRN DVNC (STAPLE) ×1 IMPLANT
SCISSORS LAP 5X35 DISP (ENDOMECHANICALS) IMPLANT
SEAL UNIV 5-12 XI (MISCELLANEOUS) ×4 IMPLANT
SET TRI-LUMEN FLTR TB AIRSEAL (TUBING) ×1 IMPLANT
SOLUTION ELECTROSURG ANTI STCK (MISCELLANEOUS) IMPLANT
STAPLE RELOAD 45 2.0 GRAY DVNC (STAPLE) ×2 IMPLANT
STAPLER 45 SUREFORM CVD DVNC (STAPLE) IMPLANT
STOPCOCK 4 WAY LG BORE MALE ST (IV SETS) ×1 IMPLANT
SUT MNCRL AB 3-0 PS2 18 (SUTURE) IMPLANT
SUT MON AB 2-0 CT1 36 (SUTURE) IMPLANT
SUT PDS AB 1 CTX 36 (SUTURE) IMPLANT
SUT PROLENE 4-0 RB1 .5 CRCL 36 (SUTURE) IMPLANT
SUT SILK 1 MH (SUTURE) ×1 IMPLANT
SUT SILK 1 TIES 10X30 (SUTURE) IMPLANT
SUT SILK 2 0 SH (SUTURE) IMPLANT
SUT SILK 2 0SH CR/8 30 (SUTURE) IMPLANT
SUT VIC AB 1 CTX36XBRD ANBCTR (SUTURE) IMPLANT
SUT VIC AB 2-0 CT1 TAPERPNT 27 (SUTURE) ×1 IMPLANT
SUT VIC AB 3-0 SH 27X BRD (SUTURE) ×2 IMPLANT
SUT VICRYL 0 TIES 12 18 (SUTURE) ×1 IMPLANT
SUT VICRYL 0 UR6 27IN ABS (SUTURE) ×2 IMPLANT
SUT VICRYL 2 TP 1 (SUTURE) IMPLANT
SYR 10ML LL (SYRINGE) ×1 IMPLANT
SYR 20ML LL LF (SYRINGE) ×1 IMPLANT
SYR 50ML LL SCALE MARK (SYRINGE) ×1 IMPLANT
SYSTEM RETRIEVAL ANCHOR 15 (MISCELLANEOUS) IMPLANT
SYSTEM SAHARA CHEST DRAIN ATS (WOUND CARE) ×1 IMPLANT
TAPE CLOTH 4X10 WHT NS (GAUZE/BANDAGES/DRESSINGS) ×1 IMPLANT
TOWEL GREEN STERILE (TOWEL DISPOSABLE) ×1 IMPLANT
TRAY FOLEY MTR SLVR 16FR STAT (SET/KITS/TRAYS/PACK) ×1 IMPLANT
TUBING EXTENTION W/L.L. (IV SETS) ×1 IMPLANT

## 2023-08-31 NOTE — Discharge Summary (Signed)
 Physician Discharge Summary       [address]       [name] [id number]             [phone]    Patient ID: Erin [name]  EMERSON BARRETTO MRN: [id number] DOB/AGE: 1960/12/31 63 y.o.  Admit date: 08/31/2023 Discharge date:09/03/2023  Admission Diagnoses:  Patient Active Problem List   Diagnosis Date Noted   N&V (nausea and vomiting) 08/08/2023   Abnormal PET scan of colon 08/08/2023   Primary adenocarcinoma of upper lobe of left lung (HCC) 07/31/2023   History of skin cancer 07/10/2023   Panic attacks 04/21/2023   Pulmonary nodule 1 cm or greater in diameter 04/21/2023   Arthritis of left shoulder region 04/02/2023   Biceps tendonitis on left 04/02/2023   OA (osteoarthritis) of shoulder 03/21/2023   S/P reverse total shoulder arthroplasty, left 03/21/2023   RUQ pain 06/29/2022   Pancreatic cyst 06/29/2022   Peripheral neuropathy 05/04/2022   Restless leg syndrome 05/04/2022   Hypertriglyceridemia 05/04/2022   Bilateral shoulder pain 05/04/2022   Encounter for general adult medical examination with abnormal findings 05/04/2022   H/O total hysterectomy 04/26/2022   Odynophagia 04/20/2022   Abdominal pain, epigastric 04/20/2022   Nausea without vomiting 06/16/2021   Allergy to alpha-gal 06/16/2021   Orthostatic hypotension 06/16/2021   Hypocortisolemia (HCC) 03/02/2021   Adrenal adenoma, left 03/02/2021   AKI (acute kidney injury) (HCC) 01/04/2021   Intractable nausea and vomiting 01/03/2021   Left-sided weakness 10/19/2020   Cough 02/11/2020   Near syncope 12/10/2019   Brain aneurysm 04/15/2019   Dysphagia 02/27/2018   Encounter for screening colonoscopy 02/27/2018   History of Clostridium difficile infection 02/27/2018   Chronic diarrhea 12/20/2017   Chronic combined systolic and diastolic congestive heart failure (HCC) 06/20/2016   Hypokalemia due to excessive gastrointestinal loss of potassium    Acute CHF (congestive heart failure) (HCC) 05/16/2016    Acute on chronic systolic CHF (congestive heart failure) (HCC) 05/16/2016   Acute respiratory failure with hypoxia (HCC)    Thrombocytosis 03/26/2016   Cardiomyopathy, ischemic 03/25/2016   Chronic combined systolic and diastolic heart failure (HCC) 03/25/2016   Anxiety state 03/25/2016   Troponin level elevated 03/25/2016   Coronary artery disease involving native coronary artery of native heart 03/25/2016   Normocytic anemia 03/25/2016   SOB (shortness of breath) 03/24/2016   Lightheadedness 03/17/2016   Hypotension 03/17/2016   Tobacco abuse 03/12/2016   NSTEMI (non-ST elevated myocardial infarction) (HCC) 03/11/2016   Atypical chest pain    Essential hypertension 09/06/2015   Hyperlipidemia 09/06/2015   GERD without esophagitis 09/06/2015   Chest pain 09/06/2015   Discharge Diagnoses:   Patient Active Problem List   Diagnosis Date Noted   Status post partial lobectomy of lung 08/31/2023   S/P lobectomy of lung 08/31/2023   N&V (nausea and vomiting) 08/08/2023   Abnormal PET scan of colon 08/08/2023   Primary adenocarcinoma of upper lobe of left lung (HCC) 07/31/2023   History of skin cancer 07/10/2023   Panic attacks 04/21/2023   Pulmonary nodule 1 cm or greater in diameter 04/21/2023   Arthritis of left shoulder region 04/02/2023   Biceps tendonitis on left 04/02/2023   OA (osteoarthritis) of shoulder 03/21/2023   S/P reverse total shoulder arthroplasty, left 03/21/2023   RUQ pain 06/29/2022   Pancreatic cyst 06/29/2022   Peripheral neuropathy 05/04/2022   Restless leg syndrome 05/04/2022   Hypertriglyceridemia 05/04/2022   Bilateral shoulder pain 05/04/2022   Encounter for general  adult medical examination with abnormal findings 05/04/2022   H/O total hysterectomy 04/26/2022   Odynophagia 04/20/2022   Abdominal pain, epigastric 04/20/2022   Nausea without vomiting 06/16/2021   Allergy to alpha-gal 06/16/2021   Orthostatic hypotension 06/16/2021    Hypocortisolemia (HCC) 03/02/2021   Adrenal adenoma, left 03/02/2021   AKI (acute kidney injury) (HCC) 01/04/2021   Intractable nausea and vomiting 01/03/2021   Left-sided weakness 10/19/2020   Cough 02/11/2020   Near syncope 12/10/2019   Brain aneurysm 04/15/2019   Dysphagia 02/27/2018   Encounter for screening colonoscopy 02/27/2018   History of Clostridium difficile infection 02/27/2018   Chronic diarrhea 12/20/2017   Chronic combined systolic and diastolic congestive heart failure (HCC) 06/20/2016   Hypokalemia due to excessive gastrointestinal loss of potassium    Acute CHF (congestive heart failure) (HCC) 05/16/2016   Acute on chronic systolic CHF (congestive heart failure) (HCC) 05/16/2016   Acute respiratory failure with hypoxia (HCC)    Thrombocytosis 03/26/2016   Cardiomyopathy, ischemic 03/25/2016   Chronic combined systolic and diastolic heart failure (HCC) 03/25/2016   Anxiety state 03/25/2016   Troponin level elevated 03/25/2016   Coronary artery disease involving native coronary artery of native heart 03/25/2016   Normocytic anemia 03/25/2016   SOB (shortness of breath) 03/24/2016   Lightheadedness 03/17/2016   Hypotension 03/17/2016   Tobacco abuse 03/12/2016   NSTEMI (non-ST elevated myocardial infarction) (HCC) 03/11/2016   Atypical chest pain    Essential hypertension 09/06/2015   Hyperlipidemia 09/06/2015   GERD without esophagitis 09/06/2015   Chest pain 09/06/2015   Consults: None  Procedure (s):   08/31/2023   Patient:  Erin  J [name] Pre-Op Dx: left upper lobe NSCLC   Post-op Dx:  same Procedure: - Robotic assisted left video thoracoscopy - left upper lobectomy - Mediastinal lymph node sampling - Intercostal nerve block   Surgeon and Role:      * Lightfoot, Marinell Siad, MD - Primary   Assistant: Lorrin Rotter, PA-C  An experienced assistant was required given the complexity of this surgery and the standard of surgical care. The assistant was  needed for exposure, dissection, suctioning, retraction of delicate tissues and sutures, instrument exchange and for overall help during this procedure.       Referring: Marlene Simas, MD Primary Care: [name], MD Primary Cardiologist: Peder Bourdon, MD   History of Present Illness:    Erin  J [name] 63 y.o. female presents for surgical evaluation of a biopsy-proven left upper lobe non-small cell lung cancer.  She does have a history of smoking but quit in October of last year.  This nodule was identified through lung cancer screening, and on subsequent imaging and has demonstrated growth.  She then underwent navigational bronchoscopy which made the diagnosis.   Of note she does have a history of congestive heart failure, and is followed by Dr. Harlie Lieu.  She denies any shortness of breath or anginal symptoms.  She has had an unintentional weight loss of about 27 pounds.  Dr. Deloise Ferries evaluated the patient and all relevant studies and recommended proceeding with lobectomy.  She was admitted this hospitalization for the procedure.  Hospital course:  The patient was admitted electively and taken to the operating room on 08/31/2023 at which time she underwent a robotic assisted left upper lobectomy with lymph node dissection.  She tolerated the procedure well and was taken to the postanesthesia care unit in stable condition.  The patient had sinus tachycardia and her home beta blocker was resumed.  CT was on waterseal with evidence of air leak.  Chest xray did not show evidence of pneumothorax.  Tube was able to be removed without incident following a clamping trial.  He did require Foley replacement on postoperative day #1 but this was able to be removed and she was able to void without difficulty.  She had an initial mild bump in her creatinine but this is returned to the normal range.  She has an expected acute blood loss anemia which appears to be stabilizing with no evidence of ongoing  bleeding.  Incisions were healing well without evidence of infection.  She was tolerating diet and routine activities commensurate for level of postoperative convalescence.  He was weaned from oxygen  and maintaining good saturations on room air.  Overall, at the time of discharge she was felt to be quite stable.      Latest Vital Signs: Blood pressure 117/61, pulse 82, temperature 98 F (36.7 C), temperature source Oral, resp. rate 19, height 5' (1.524 m), weight 49.9 kg, SpO2 94%.  Physical Exam:   General appearance: alert, cooperative, and no distress Heart: regular rate and rhythm Lungs: clear to auscultation bilaterally Abdomen: benign Extremities: no edema or calf tenderness Wound: incis healing well Discharge Condition: Good  Recent laboratory studies:  Lab Results  Component Value Date   WBC 9.9 09/02/2023   HGB 8.4 (L) 09/02/2023   HCT 25.4 (L) 09/02/2023   MCV 80.9 09/02/2023   PLT 292 09/02/2023   Lab Results  Component Value Date   NA 139 09/02/2023   K 4.6 09/02/2023   CL 106 09/02/2023   CO2 27 09/02/2023   CREATININE 1.06 (H) 09/02/2023   GLUCOSE 102 (H) 09/02/2023     Discharge Medications: Allergies as of 09/02/2023       Reactions   Alpha-gal Shortness Of Breath, Nausea And Vomiting, Dermatitis   Other Shortness Of Breath, Diarrhea, Nausea And Vomiting, Nausea Only   All- red meats/dairy Medications in Capsule form    Beef-derived Drug Products Diarrhea, Nausea And Vomiting   Chantix  [varenicline ] Nausea Only   Pork-derived Products Diarrhea, Nausea And Vomiting   Reglan  [metoclopramide ]    Involuntary movements   Tape Other (See Comments)   PEELS SKIN OFF  (PAPER TAPE IS FINE)        Medication List     TAKE these medications    aspirin  EC 81 MG tablet Take 1 tablet (81 mg total) by mouth every evening. Okay to restart on 2/29/2025 What changed: when to take this   atorvastatin  80 MG tablet Commonly known as: LIPITOR  Take 1 tablet  (80 mg total) by mouth daily. What changed: when to take this   clopidogrel  75 MG tablet Commonly known as: PLAVIX  Take 1 tablet (75 mg total) by mouth daily. Okay to restart on 07/04/2023 What changed:  when to take this additional instructions   dapagliflozin  propanediol 10 MG Tabs tablet Commonly known as: Farxiga  Take 1 tablet (10 mg total) by mouth daily before breakfast.   EPINEPHrine  0.3 mg/0.3 mL Soaj injection Commonly known as: EpiPen  2-Pak Inject 0.3 mg into the muscle as needed for anaphylaxis.   ezetimibe  10 MG tablet Commonly known as: ZETIA  Take 1 tablet (10 mg total) by mouth daily.   fenofibrate  145 MG tablet Commonly known as: Tricor  Take 1 tablet (145 mg total) by mouth daily.   furosemide  20 MG tablet Commonly known as: LASIX  Take 40 mg in am and 20 mg in pm  gabapentin  300 MG capsule Commonly known as: NEURONTIN  Take 1 capsule by mouth at bedtime   GaviLyte-N  with Flavor Pack 420 g solution Generic drug: polyethylene glycol-electrolytes Take 4,000 mLs by mouth once.   hydrOXYzine  25 MG capsule Commonly known as: VISTARIL  TAKE 1 CAPSULE BY MOUTH EVERY 8 HOURS AS NEEDED   linaclotide  145 MCG Caps capsule Commonly known as: Linzess  Take 1 capsule (145 mcg total) by mouth daily before breakfast. What changed:  when to take this reasons to take this   losartan  25 MG tablet Commonly known as: COZAAR  Take 0.5 tablets (12.5 mg total) by mouth at bedtime.   MAGnesium -Oxide 400 (240 Mg) MG tablet Generic drug: magnesium  oxide Take 1 tablet by mouth daily.   metoprolol  succinate 25 MG 24 hr tablet Commonly known as: Toprol  XL Take 1 tablet (25 mg total) by mouth at bedtime.   multivitamin with minerals tablet Take 1 tablet by mouth daily.   nicotine  21 mg/24hr patch Commonly known as: NICODERM CQ  - dosed in mg/24 hours Place 21 mg onto the skin daily.   nitroGLYCERIN  0.4 MG SL tablet Commonly known as: NITROSTAT  Place 1 tablet (0.4 mg  total) under the tongue every 5 (five) minutes x 3 doses as needed for chest pain (if no relief after 2nd dose, proceed to the ED for an evaluation or call 911).   ondansetron  8 MG tablet Commonly known as: ZOFRAN  Take 1 tablet (8 mg total) by mouth every 8 (eight) hours as needed for nausea or vomiting. What changed: when to take this   oxyCODONE  5 MG immediate release tablet Commonly known as: Oxy IR/ROXICODONE  Take 1 tablet (5 mg total) by mouth every 6 (six) hours as needed for up to 7 days for moderate pain (pain score 4-6).   pantoprazole  40 MG tablet Commonly known as: PROTONIX  Take 40 mg by mouth daily.   potassium chloride  SA 20 MEQ tablet Commonly known as: KLOR-CON  M Take 2 tablets (40 mEq total) by mouth 2 (two) times daily.   Repatha  SureClick 140 MG/ML Soaj Generic drug: Evolocumab  Inject 140 mg into the skin every 14 (fourteen) days.   rOPINIRole  0.5 MG tablet Commonly known as: REQUIP  Take 0.5 mg by mouth at bedtime.   sertraline  50 MG tablet Commonly known as: ZOLOFT  Take 1 tablet (50 mg total) by mouth daily.   spironolactone  25 MG tablet Commonly known as: ALDACTONE  Take 1 tablet (25 mg total) by mouth at bedtime.   traZODone  50 MG tablet Commonly known as: DESYREL  Take 1 tablet (50 mg total) by mouth at bedtime. What changed:  when to take this reasons to take this   Vitamin D  (Ergocalciferol ) 1.25 MG (50000 UNIT) Caps capsule Commonly known as: DRISDOL Take 50,000 Units by mouth every 7 (seven) days. Mondays        Follow Up Appointments:  Follow-up Information     [facility] at [facility] ?" A Department of The [facility] Follow up.   Specialty: Cardiothoracic Surgery Why: Please see discharge paperwork for details of follow-up appointment with Dr. Deloise Ferries. Contact information: [address] [place]  [address] [phone]                 Signed: Lord Rod 09/05/2023, 7:27 AM

## 2023-08-31 NOTE — Plan of Care (Signed)
   Problem: Education: Goal: Knowledge of General Education information will improve Description: Including pain rating scale, medication(s)/side effects and non-pharmacologic comfort measures Outcome: Progressing   Problem: Health Behavior/Discharge Planning: Goal: Ability to manage health-related needs will improve Outcome: Progressing   Problem: Clinical Measurements: Goal: Ability to maintain clinical measurements within normal limits will improve Outcome: Progressing Goal: Will remain free from infection Outcome: Progressing Goal: Diagnostic test results will improve Outcome: Progressing Goal: Respiratory complications will improve Outcome: Progressing Goal: Cardiovascular complication will be avoided Outcome: Progressing   Problem: Activity: Goal: Risk for activity intolerance will decrease Outcome: Progressing   Problem: Nutrition: Goal: Adequate nutrition will be maintained Outcome: Progressing   Problem: Coping: Goal: Level of anxiety will decrease Outcome: Progressing   Problem: Elimination: Goal: Will not experience complications related to bowel motility Outcome: Progressing Goal: Will not experience complications related to urinary retention Outcome: Progressing   Problem: Pain Managment: Goal: General experience of comfort will improve and/or be controlled Outcome: Progressing   Problem: Safety: Goal: Ability to remain free from injury will improve Outcome: Progressing   Problem: Skin Integrity: Goal: Risk for impaired skin integrity will decrease Outcome: Progressing   Problem: Education: Goal: Knowledge of disease or condition will improve Outcome: Progressing Goal: Knowledge of the prescribed therapeutic regimen will improve Outcome: Progressing   Problem: Activity: Goal: Risk for activity intolerance will decrease Outcome: Progressing   Problem: Cardiac: Goal: Will achieve and/or maintain hemodynamic stability Outcome: Progressing    Problem: Clinical Measurements: Goal: Postoperative complications will be avoided or minimized Outcome: Progressing   Problem: Respiratory: Goal: Respiratory status will improve Outcome: Progressing   Problem: Pain Management: Goal: Pain level will decrease Outcome: Progressing   Problem: Skin Integrity: Goal: Wound healing without signs and symptoms infection will improve Outcome: Progressing

## 2023-08-31 NOTE — Progress Notes (Signed)
 Spoke to Dr. Allean Island regarding patient's [employer] ICD. Per Dr. Allean Island a rep is not needed. Per Dr. Allean Island a magnet will be used during the case.

## 2023-08-31 NOTE — Transfer of Care (Signed)
  Immediate Anesthesia Transfer of Care Note  Patient: Erin [name]  Procedure(s) Performed: LOBECTOMY, LEFT UPPER LUNG, ROBOT-ASSISTED, USING VATS (Left: Chest) LYMPH NODE BIOPSY (Left: Chest) BLOCK, NERVE, INTERCOSTAL (Left: Chest)  Patient Location: PACU  Anesthesia Type:General  Level of Consciousness: awake, drowsy, patient cooperative, and responds to stimulation  Airway & Oxygen  Therapy: Patient Spontanous Breathing and Patient connected to face mask oxygen   Post-op Assessment: Report given to RN, Post -op Vital signs reviewed and stable, Patient moving all extremities X 4, and Patient able to stick tongue midline  Post vital signs: Reviewed and stable  Last Vitals:  Vitals Value Taken Time  BP 148/87 08/31/23 1109  Temp 98.6   Pulse 78 08/31/23 1113  Resp 16 08/31/23 1113  SpO2 100 % 08/31/23 1113  Vitals shown include unfiled device data.  Last Pain:  Vitals:   08/31/23 0650  TempSrc:   PainSc: 0-No pain      Patients Stated Pain Goal: 0 (08/31/23 0650)  Complications: No notable events documented.

## 2023-08-31 NOTE — Op Note (Signed)
      [address]       [place] [address]             [phone]        08/31/2023  Patient:  Erin  J [name] Pre-Op Dx: left upper lobe NSCLC   Post-op Dx:  same Procedure: - Robotic assisted left video thoracoscopy - left upper lobectomy - Mediastinal lymph node sampling - Intercostal nerve block  Surgeon and Role:      * Faria Casella, Marinell Siad, MD - Primary  Assistant: Lorrin Rotter, PA-C  An experienced assistant was required given the complexity of this surgery and the standard of surgical care. The assistant was needed for exposure, dissection, suctioning, retraction of delicate tissues and sutures, instrument exchange and for overall help during this procedure.    Anesthesia  general EBL:  100 ml Blood Administration: none Specimen:  left upper lobe, hilar and mediastinal nodes  Drains: 28 F argyle chest tube in left chest Counts: correct   Indications: 63yo female with 1.4cm left upper lobe adenocarcinoma.  Marginal pulmonary function with DLCO of 58%.  CHF with EF of 35%, and hx of AICD.  She is not a candidate for radiation unless she agrees to have her AICD moved to the contralateral chest.  She is moderate risk given her heart function, but I think that surgery is really her only option.  We discussed the risks and benefits of a left robotic assisted thoracoscopy with lobectomy.  She is agreeable to proceed.   Findings: Normal anatomy  Operative Technique: After the risks, benefits and alternatives were thoroughly discussed, the patient was brought to the operative theatre.  Anesthesia was induced, and the patient was then placed in a lateral decubitus position and was prepped and draped in normal sterile fashion.  An appropriate surgical pause was performed, and pre-operative antibiotics were dosed accordingly.  We began by placing our 4 robotic ports in the the 7th intercostal space targeting the hilum of the lung.  A 12mm assistant port was placed  in the 9th intercostal space in the anterior axillary line.  The robot was then docked and all instruments were passed under direct visualization.    The lung was then retracted superiorly, and the inferior pulmonary ligament was divided.  The hilum was mobilized anteriorly and posteriorly.  We identified the upper lobe pulmonary vein, and after careful isolation, it was divided with a vascular stapler.  We next moved to the pulmonary artery.  The artery was then divided with a vascular load stapler.  The bronchus to the upper lobe was then isolated.  After a test clamp, with good ventilation of the remaining lung, the bronchus was then divided.  The fissure was completed, and the specimen was passed into an endocatch bag.  It was removed from the anterior access site.    Lymph nodes were then sampled at hilum and mediastinum.  The chest was irrigated, and an air leak test was performed.  An intercostal nerve block was performed under direct visualization.  A 28 F chest tube was then placed, and we watch the remaining lobes re-expand.  The skin and soft tissue were closed with absorbable suture    The patient tolerated the procedure without any immediate complications, and was transferred to the PACU in stable condition.  Bernardine Langworthy Ala Alice

## 2023-08-31 NOTE — Hospital Course (Addendum)
    Referring: Marlene Simas, MD Primary Care: [name], MD Primary Cardiologist: Peder Bourdon, MD   History of Present Illness:    Erin  J [name] 63 y.o. female presents for surgical evaluation of a biopsy-proven left upper lobe non-small cell lung cancer.  She does have a history of smoking but quit in October of last year.  This nodule was identified through lung cancer screening, and on subsequent imaging and has demonstrated growth.  She then underwent navigational bronchoscopy which made the diagnosis.   Of note she does have a history of congestive heart failure, and is followed by Dr. Harlie Lieu.  She denies any shortness of breath or anginal symptoms.  She has had an unintentional weight loss of about 27 pounds.  Dr. Deloise Ferries evaluated the patient and all relevant studies and recommended proceeding with lobectomy.  She was admitted this hospitalization for the procedure.  Hospital course:  The patient was admitted electively and taken to the operating room on 08/31/2023 at which time she underwent a robotic assisted left upper lobectomy with lymph node dissection.  She tolerated the procedure well and was taken to the postanesthesia care unit in stable condition.  The patient had sinus tachycardia and her home beta blocker was resumed.  CT was on waterseal with evidence of air leak.  Chest xray did not show evidence of pneumothorax.  Tube was able to be removed without incident following a clamping trial.  He did require Foley replacement on postoperative day #1 but this was able to be removed and she was able to void without difficulty.  She had an initial mild bump in her creatinine but this is returned to the normal range.  She has an expected acute blood loss anemia which appears to be stabilizing with no evidence of ongoing bleeding.  Incisions were healing well without evidence of infection.  She was tolerating diet and routine activities commensurate for level of postoperative  convalescence.  He was weaned from oxygen  and maintaining good saturations on room air.  Overall, at the time of discharge she was felt to be quite stable.

## 2023-08-31 NOTE — Anesthesia Procedure Notes (Addendum)
 Arterial Line Insertion Start/End4/24/2025 7:10 AM, 08/31/2023 7:15 AM Performed by: Leslye Rast, MD, Alys Julian, CRNA, CRNA  Patient location: Pre-op. Preanesthetic checklist: patient identified, IV checked, site marked, risks and benefits discussed, surgical consent, monitors and equipment checked, pre-op evaluation, timeout performed and anesthesia consent Lidocaine  1% used for infiltration Right, radial was placed Catheter size: 20 G Hand hygiene performed  and maximum sterile barriers used  Allen's test indicative of satisfactory collateral circulation Attempts: 1 Procedure performed using ultrasound guided technique. Ultrasound Notes:anatomy identified, needle tip was noted to be adjacent to the nerve/plexus identified and no ultrasound evidence of intravascular and/or intraneural injection Following insertion, Biopatch and dressing applied. Post procedure assessment: normal and unchanged  Patient tolerated the procedure well with no immediate complications. Additional procedure comments: Placed by SRNA.

## 2023-08-31 NOTE — Brief Op Note (Signed)
 08/31/2023  10:56 AM  PATIENT:  Erin  J [name]  63 y.o. female  PRE-OPERATIVE DIAGNOSIS:  non-small cell lung cancer  POST-OPERATIVE DIAGNOSIS:  non-small lung cancer  PROCEDURE:  Procedure(s) with comments: LOBECTOMY, LEFT UPPER LUNG, ROBOT-ASSISTED, USING VATS (Left) - Left Robotic Assisted Thoracoscopy for Left Upper Lobectomy LYMPH NODE BIOPSY (Left) BLOCK, NERVE, INTERCOSTAL (Left)  SURGEON:  Surgeons and Role:    * Lightfoot, Marinell Siad, MD - Primary  PHYSICIAN ASSISTANT: Grover Robinson PA-C  ANESTHESIA:   local, general, and INTERCOSTAL NERVE BLOCK  EBL:  50 ML  BLOOD ADMINISTERED:none  DRAINS:  1  Chest Tube(s) in the LEFT HEMITHORAX    LOCAL MEDICATIONS USED:  BUPIVICAINE  and OTHER EXPAREL   SPECIMEN:  Source of Specimen:  LUL AND LN SAMPLES  DISPOSITION OF SPECIMEN:  PATHOLOGY  COUNTS:  YES  TOURNIQUET:  * No tourniquets in log *  DICTATION: .Dragon Dictation  PLAN OF CARE: Admit to inpatient   PATIENT DISPOSITION:  PACU - hemodynamically stable.   Delay start of Pharmacological VTE agent (>24hrs) due to surgical blood loss or risk of bleeding: no  COMPLICATIONS: NO KNOWN

## 2023-08-31 NOTE — Interval H&P Note (Signed)
 History and Physical Interval Note:  08/31/2023 7:40 AM  Erin  J [name]  has presented today for surgery, with the diagnosis of non-small cell lung cancer.  The various methods of treatment have been discussed with the patient and family. After consideration of risks, benefits and other options for treatment, the patient has consented to  Procedure(s) with comments: LOBECTOMY, LUNG, ROBOT-ASSISTED, USING VATS (Left) - Left Robotic Assisted Thoracoscopy for Left Upper Lobectomy as a surgical intervention.  The patient's history has been reviewed, patient examined, no change in status, stable for surgery.  I have reviewed the patient's chart and labs.  Questions were answered to the patient's satisfaction.     Clee Pandit Ala Alice

## 2023-08-31 NOTE — Anesthesia Procedure Notes (Signed)
 Procedure Name: Intubation Date/Time: 08/31/2023 8:06 AM  Performed by: Eugena Herter, RNPre-anesthesia Checklist: Patient identified, Emergency Drugs available, Suction available, Patient being monitored and Timeout performed Patient Re-evaluated:Patient Re-evaluated prior to induction Oxygen  Delivery Method: Circle system utilized Preoxygenation: Pre-oxygenation with 100% oxygen  Induction Type: Combination inhalational/ intravenous induction Ventilation: Oral airway inserted - appropriate to patient size Laryngoscope Size: Mac and 3 Grade View: Grade I Tube type: Oral Endobronchial tube: Double lumen EBT, EBT position confirmed by fiberoptic bronchoscope, Left and EBT position confirmed by auscultation and 35 Fr Number of attempts: 1 Airway Equipment and Method: Stylet and Fiberoptic brochoscope Placement Confirmation: ETT inserted through vocal cords under direct vision, positive ETCO2 and breath sounds checked- equal and bilateral Secured at: 27 cm Tube secured with: Tape Dental Injury: Teeth and Oropharynx as per pre-operative assessment

## 2023-08-31 NOTE — Anesthesia Postprocedure Evaluation (Signed)
 Anesthesia Post Note  Patient: Karman  J Henshaw  Procedure(s) Performed: LOBECTOMY, LEFT UPPER LUNG, ROBOT-ASSISTED, USING VATS (Left: Chest) LYMPH NODE BIOPSY (Left: Chest) BLOCK, NERVE, INTERCOSTAL (Left: Chest)     Patient location during evaluation: PACU Anesthesia Type: General Level of consciousness: awake and alert Pain management: pain level controlled Vital Signs Assessment: post-procedure vital signs reviewed and stable Respiratory status: spontaneous breathing, nonlabored ventilation, respiratory function stable and patient connected to nasal cannula oxygen  Cardiovascular status: blood pressure returned to baseline and stable Postop Assessment: no apparent nausea or vomiting Anesthetic complications: no   No notable events documented.  Last Vitals:  Vitals:   08/31/23 1130 08/31/23 1145  BP: (!) 115/92 134/79  Pulse: 89 77  Resp: (!) 23 (!) 22  Temp:    SpO2: 99% 100%    Last Pain:  Vitals:   08/31/23 0650  TempSrc:   PainSc: 0-No pain                 Leslye Rast

## 2023-09-01 ENCOUNTER — Encounter (HOSPITAL_COMMUNITY): Payer: Self-pay | Admitting: Thoracic Surgery (Cardiothoracic Vascular Surgery)

## 2023-09-01 ENCOUNTER — Inpatient Hospital Stay (HOSPITAL_COMMUNITY)

## 2023-09-01 LAB — BASIC METABOLIC PANEL WITH GFR
Anion gap: 9 (ref 5–15)
BUN: 14 mg/dL (ref 8–23)
CO2: 23 mmol/L (ref 22–32)
Calcium: 8.9 mg/dL (ref 8.9–10.3)
Chloride: 104 mmol/L (ref 98–111)
Creatinine, Ser: 1.48 mg/dL — ABNORMAL HIGH (ref 0.44–1.00)
GFR, Estimated: 40 mL/min — ABNORMAL LOW (ref >60)
Glucose, Bld: 99 mg/dL (ref 70–99)
Potassium: 4.1 mmol/L (ref 3.5–5.1)
Sodium: 136 mmol/L (ref 135–145)

## 2023-09-01 LAB — CBC
HCT: 26.3 % — ABNORMAL LOW (ref 36.0–46.0)
Hemoglobin: 8.6 g/dL — ABNORMAL LOW (ref 12.0–15.0)
MCH: 26.3 pg (ref 26.0–34.0)
MCHC: 32.7 g/dL (ref 30.0–36.0)
MCV: 80.4 fL (ref 80.0–100.0)
Platelets: 307 10*3/uL (ref 150–400)
RBC: 3.27 MIL/uL — ABNORMAL LOW (ref 3.87–5.11)
RDW: 16.4 % — ABNORMAL HIGH (ref 11.5–15.5)
WBC: 10.9 10*3/uL — ABNORMAL HIGH (ref 4.0–10.5)
nRBC: 0 % (ref 0.0–0.2)

## 2023-09-01 MED ORDER — ENOXAPARIN SODIUM 30 MG/0.3ML IJ SOSY
30.0000 mg | PREFILLED_SYRINGE | INTRAMUSCULAR | Status: DC
  Administered 2023-09-01 – 2023-09-02 (×2): 30 mg via SUBCUTANEOUS
  Filled 2023-09-01 (×2): qty 0.3

## 2023-09-01 NOTE — Plan of Care (Signed)

## 2023-09-01 NOTE — TOC Initial Note (Signed)
 Transition of Care [facility]) - Initial/Assessment Note    Patient Details  Name: Erin  TYREISHA [name] MRN: [id number] Date of Birth: [birth year]  Transition of Care [facility]) CM/SW Contact:    Juliane Och, LCSW Phone Number: 09/01/2023, 12:41 PM  Clinical Narrative:                  12:41 PM CSW introduced self and role to patient at bedside. Patient confirmed she resides at home with spouse, adult daughter, 4 minor children, and 4 adult grandchildren. Patient stated that family could provide transportation and home support upon discharge if needed. Patient declined SNF/HH/DME history. No TOC needs identified at this time.  Expected Discharge Plan: Home/Self Care Barriers to Discharge: Continued Medical Work up   Patient Goals and CMS Choice Patient states their goals for this hospitalization and ongoing recovery are:: to return home          Expected Discharge Plan and Services       Living arrangements for the past 2 months: Single Family Home                                      Prior Living Arrangements/Services Living arrangements for the past 2 months: Single Family Home Lives with:: Spouse, Adult Children, Relatives Patient language and need for interpreter reviewed:: Yes Do you feel safe going back to the place where you live?: Yes      Need for Family Participation in Patient Care: No (Comment) Care giver support system in place?: Yes (comment)   Criminal Activity/Legal Involvement Pertinent to Current Situation/Hospitalization: No - Comment as needed  Activities of Daily Living   ADL Screening (condition at time of admission) Independently performs ADLs?: Yes (appropriate for developmental age) Is the patient deaf or have difficulty hearing?: No Does the patient have difficulty seeing, even when wearing glasses/contacts?: No Does the patient have difficulty concentrating, remembering, or making decisions?: No  Permission Sought/Granted Permission sought  to share information with : Family Supports Permission granted to share information with : No (Contact information on chart)  Share Information with NAME: Edris Gowers     Permission granted to share info w Relationship: Spouse  Permission granted to share info w Contact Information: [phone]  Emotional Assessment Appearance:: Appears stated age Attitude/Demeanor/Rapport: Engaged Affect (typically observed): Accepting, Pleasant, Adaptable, Stable, Calm, Appropriate Orientation: : Oriented to Situation, Oriented to  Time, Oriented to Self, Oriented to Place Alcohol / Substance Use: Not Applicable Psych Involvement: No (comment)  Admission diagnosis:  Malignant neoplasm of upper lobe bronchus, left (HCC) [C34.12] Status post partial lobectomy of lung [Z90.2] S/P lobectomy of lung [Z90.2] Patient Active Problem List   Diagnosis Date Noted   Status post partial lobectomy of lung 08/31/2023   S/P lobectomy of lung 08/31/2023   N&V (nausea and vomiting) 08/08/2023   Abnormal PET scan of colon 08/08/2023   Primary adenocarcinoma of upper lobe of left lung (HCC) 07/31/2023   History of skin cancer 07/10/2023   Panic attacks 04/21/2023   Pulmonary nodule 1 cm or greater in diameter 04/21/2023   Arthritis of left shoulder region 04/02/2023   Biceps tendonitis on left 04/02/2023   OA (osteoarthritis) of shoulder 03/21/2023   S/P reverse total shoulder arthroplasty, left 03/21/2023   RUQ pain 06/29/2022   Pancreatic cyst 06/29/2022   Peripheral neuropathy 05/04/2022   Restless leg syndrome 05/04/2022   Hypertriglyceridemia 05/04/2022  Bilateral shoulder pain 05/04/2022   Encounter for general adult medical examination with abnormal findings 05/04/2022   H/O total hysterectomy 04/26/2022   Odynophagia 04/20/2022   Abdominal pain, epigastric 04/20/2022   Nausea without vomiting 06/16/2021   Allergy to alpha-gal 06/16/2021   Orthostatic hypotension 06/16/2021   Hypocortisolemia  (HCC) 03/02/2021   Adrenal adenoma, left 03/02/2021   AKI (acute kidney injury) (HCC) 01/04/2021   Intractable nausea and vomiting 01/03/2021   Left-sided weakness 10/19/2020   Cough 02/11/2020   Near syncope 12/10/2019   Brain aneurysm 04/15/2019   Dysphagia 02/27/2018   Encounter for screening colonoscopy 02/27/2018   History of Clostridium difficile infection 02/27/2018   Chronic diarrhea 12/20/2017   Chronic combined systolic and diastolic congestive heart failure (HCC) 06/20/2016   Hypokalemia due to excessive gastrointestinal loss of potassium    Acute CHF (congestive heart failure) (HCC) 05/16/2016   Acute on chronic systolic CHF (congestive heart failure) (HCC) 05/16/2016   Acute respiratory failure with hypoxia (HCC)    Thrombocytosis 03/26/2016   Cardiomyopathy, ischemic 03/25/2016   Chronic combined systolic and diastolic heart failure (HCC) 03/25/2016   Anxiety state 03/25/2016   Troponin level elevated 03/25/2016   Coronary artery disease involving native coronary artery of native heart 03/25/2016   Normocytic anemia 03/25/2016   SOB (shortness of breath) 03/24/2016   Lightheadedness 03/17/2016   Hypotension 03/17/2016   Tobacco abuse 03/12/2016   NSTEMI (non-ST elevated myocardial infarction) (HCC) 03/11/2016   Atypical chest pain    Essential hypertension 09/06/2015   Hyperlipidemia 09/06/2015   GERD without esophagitis 09/06/2015   Chest pain 09/06/2015   PCP:  [name], MD Pharmacy:   [facility] [address] [place] - [address] [place] [address] Phone: [phone] Fax: [phone]     Social Drivers of Health (SDOH) Social History: SDOH Screenings   Food Insecurity: No Food Insecurity (07/26/2023)  Housing: Low Risk  (07/26/2023)  Transportation Needs: No Transportation Needs (07/26/2023)  Utilities: Not At Risk (07/26/2023)  Alcohol Screen: Low Risk  (06/11/2021)  Depression (PHQ2-9): Medium Risk (07/26/2023)  Financial  Resource Strain: Low Risk  (04/20/2023)  Physical Activity: Insufficiently Active (04/20/2023)  Social Connections: Socially Isolated (04/20/2023)  Stress: Stress Concern Present (04/20/2023)  Tobacco Use: Medium Risk (08/31/2023)   SDOH Interventions:     Readmission Risk Interventions     No data to display

## 2023-09-01 NOTE — Plan of Care (Signed)
  Problem: Education: Goal: Knowledge of General Education information will improve Description: Including pain rating scale, medication(s)/side effects and non-pharmacologic comfort measures Outcome: Progressing   Problem: Health Behavior/Discharge Planning: Goal: Ability to manage health-related needs will improve Outcome: Progressing   Problem: Clinical Measurements: Goal: Respiratory complications will improve Outcome: Progressing   Problem: Activity: Goal: Risk for activity intolerance will decrease Outcome: Progressing   Problem: Coping: Goal: Level of anxiety will decrease Outcome: Progressing   Problem: Activity: Goal: Risk for activity intolerance will decrease Outcome: Progressing   Problem: Respiratory: Goal: Respiratory status will improve Outcome: Progressing

## 2023-09-01 NOTE — Progress Notes (Signed)
 Mobility Specialist Progress Note;   09/01/23 1007  Mobility  Activity Ambulated with assistance in hallway  Level of Assistance Standby assist, set-up cues, supervision of patient - no hands on  Assistive Device Front wheel walker  Distance Ambulated (ft) 150 ft  Activity Response Tolerated well  Mobility Referral Yes  Mobility visit 1 Mobility  Mobility Specialist Start Time (ACUTE ONLY) 1023  Mobility Specialist Stop Time (ACUTE ONLY) 1037  Mobility Specialist Time Calculation (min) (ACUTE ONLY) 14 min   Pt eager for mobility. On 1LO2 upon arrival. Required no physical assistance during ambulation, SV. Ambulated on RA, VSS throughout. No c/o when asked. Pt returned safely back to bed with all needs met, alarm on.   Janit Meline Mobility Specialist Please contact via SecureChat or [employer] [phone]

## 2023-09-01 NOTE — Progress Notes (Addendum)
 1 Day Post-Op Procedure(s) (LRB): LOBECTOMY, LEFT UPPER LUNG, ROBOT-ASSISTED, USING VATS (Left) LYMPH NODE BIOPSY (Left) BLOCK, NERVE, INTERCOSTAL (Left) Subjective: Mild pain  Objective: Vital signs in last 24 hours: Temp:  [94.4 F (34.7 C)-98.6 F (37 C)] 98.4 F (36.9 C) (04/25 0500) Pulse Rate:  [66-113] 88 (04/25 0500) Cardiac Rhythm: Normal sinus rhythm (04/24 1900) Resp:  [11-23] 16 (04/25 0500) BP: (91-148)/(61-100) 91/61 (04/25 0500) SpO2:  [99 %-100 %] 99 % (04/25 0500) Arterial Line BP: (131-161)/(77-93) 131/90 (04/24 1200)  Hemodynamic parameters for last 24 hours:    Intake/Output from previous day: 04/24 0701 - 04/25 0700 In: 1570 [P.O.:120; I.V.:1100; IV Piggyback:350] Out: 846 [Urine:576; Blood:30; Chest Tube:240] Intake/Output this shift: No intake/output data recorded.  General appearance: alert, cooperative, and no distress Heart: regular rate and rhythm Lungs: mild coarseness , moves air throughout  Abdomen: benign Extremities: non tender  Wound: incis healing well  Lab Results: Recent Labs    08/29/23 1325 09/01/23 0247  WBC 7.2 10.9*  HGB 10.0* 8.6*  HCT 31.2* 26.3*  PLT 384 307   BMET:  Recent Labs    08/29/23 1325 09/01/23 0247  NA 140 136  K 4.3 4.1  CL 105 104  CO2 26 23  GLUCOSE 85 99  BUN 13 14  CREATININE 1.29* 1.48*  CALCIUM  9.6 8.9    PT/INR:  Recent Labs    08/29/23 1325  LABPROT 12.7  INR 0.9   ABG    Component Value Date/Time   HCO3 23.5 08/19/2019 0813   TCO2 25 08/19/2019 0813   ACIDBASEDEF 2.0 08/19/2019 0813   O2SAT 72.0 08/19/2019 0813   CBG (last 3)  No results for input(s): "GLUCAP" in the last 72 hours.  Meds Scheduled Meds:  acetaminophen   1,000 mg Oral Q6H   Or   acetaminophen  (TYLENOL ) oral liquid 160 mg/5 mL  1,000 mg Oral Q6H   aspirin  EC  81 mg Oral Daily   atorvastatin   80 mg Oral QHS   bisacodyl   10 mg Oral Daily   Chlorhexidine  Gluconate Cloth  6 each Topical Q0600   clopidogrel    75 mg Oral QHS   dapagliflozin  propanediol  10 mg Oral QAC breakfast   ezetimibe   10 mg Oral Daily   fenofibrate   54 mg Oral Daily   gabapentin   300 mg Oral QHS   ipratropium-albuterol   3 mL Nebulization Q6H   losartan   12.5 mg Oral QHS   magnesium  oxide  400 mg Oral Daily   metoprolol  succinate  25 mg Oral Daily   multivitamin with minerals  1 tablet Oral Daily   nicotine   21 mg Transdermal Daily   pantoprazole   40 mg Oral Daily   rOPINIRole   0.5 mg Oral QHS   senna-docusate  1 tablet Oral QHS   sertraline   50 mg Oral Daily   Continuous Infusions: PRN Meds:.fentaNYL  (SUBLIMAZE ) injection, ondansetron  (ZOFRAN ) IV, oxyCODONE , traMADol , traZODone   Xrays DG Chest Port 1 View Result Date: 08/31/2023 CLINICAL DATA:  Status post left lobectomy. EXAM: PORTABLE CHEST 1 VIEW COMPARISON:  08/29/2023 FINDINGS: Interval left chest tube with no pneumothorax seen. Subcutaneous emphysema associated with the chest tube inferiorly. Small amount of linear and ill-defined density in the left lower lung zone. Clear right lung. Grossly stable normal sized heart. Stable left subclavian AICD. Diffuse osteopenia. Mild-to-moderate right glenohumeral degenerative changes and left glenohumeral prosthesis. IMPRESSION: 1. Interval left chest tube with no pneumothorax seen. 2. Small amount of left lower lung zone atelectasis and possible  pulmonary hemorrhage. Electronically Signed   By: Catherin Closs M.D.   On: 08/31/2023 15:40    Assessment/Plan: S/P Procedure(s) (LRB): LOBECTOMY, LEFT UPPER LUNG, ROBOT-ASSISTED, USING VATS (Left) LYMPH NODE BIOPSY (Left) BLOCK, NERVE, INTERCOSTAL (Left)  POD#1  1 afeb, S BP 90's-140's, HR 60's-110's, sinus tachy currently- back on beta blocker 2 O2 sats good on 2 liters [place] 3 CT - 240 ml, small air leak with cough- cont tube for now 4 I/O- adeq UOP 5 CXR - lungs appear cleat, difficult to see leff lateral lung w/ pacemaker in way 6 creat up a little from preop but around  baseline which is variable- not on ketorolac  7 slight leukocytosis, likely reactive 8 expected ABLA- monitor, not at transfusion threshold, was anemic preop 9 will check w. Pharmacy to see what she can have for DVT PPX- has alpha-gal allergy 10 encourage pulm hygiene and routine rehab   LOS: 1 day    Lindi Revering PA-C Pager [phone] 09/01/2023   IS, ambulation Clamping trial tomorrow  Hilarie Lovely

## 2023-09-02 ENCOUNTER — Inpatient Hospital Stay (HOSPITAL_COMMUNITY)

## 2023-09-02 LAB — COMPREHENSIVE METABOLIC PANEL WITH GFR
ALT: 9 U/L (ref 0–44)
AST: 24 U/L (ref 15–41)
Albumin: 2.8 g/dL — ABNORMAL LOW (ref 3.5–5.0)
Alkaline Phosphatase: 24 U/L — ABNORMAL LOW (ref 38–126)
Anion gap: 6 (ref 5–15)
BUN: 9 mg/dL (ref 8–23)
CO2: 27 mmol/L (ref 22–32)
Calcium: 8.8 mg/dL — ABNORMAL LOW (ref 8.9–10.3)
Chloride: 106 mmol/L (ref 98–111)
Creatinine, Ser: 1.06 mg/dL — ABNORMAL HIGH (ref 0.44–1.00)
GFR, Estimated: 60 mL/min — ABNORMAL LOW (ref >60)
Glucose, Bld: 102 mg/dL — ABNORMAL HIGH (ref 70–99)
Potassium: 4.6 mmol/L (ref 3.5–5.1)
Sodium: 139 mmol/L (ref 135–145)
Total Bilirubin: 0.6 mg/dL (ref 0.0–1.2)
Total Protein: 5 g/dL — ABNORMAL LOW (ref 6.5–8.1)

## 2023-09-02 LAB — CBC
HCT: 25.4 % — ABNORMAL LOW (ref 36.0–46.0)
Hemoglobin: 8.4 g/dL — ABNORMAL LOW (ref 12.0–15.0)
MCH: 26.8 pg (ref 26.0–34.0)
MCHC: 33.1 g/dL (ref 30.0–36.0)
MCV: 80.9 fL (ref 80.0–100.0)
Platelets: 292 10*3/uL (ref 150–400)
RBC: 3.14 MIL/uL — ABNORMAL LOW (ref 3.87–5.11)
RDW: 16.7 % — ABNORMAL HIGH (ref 11.5–15.5)
WBC: 9.9 10*3/uL (ref 4.0–10.5)
nRBC: 0 % (ref 0.0–0.2)

## 2023-09-02 MED ORDER — OXYCODONE HCL 5 MG PO TABS: 5.0000 mg | ORAL_TABLET | Freq: Four times a day (QID) | ORAL | 0 refills | Status: AC | PRN

## 2023-09-02 NOTE — Plan of Care (Signed)
   Problem: Education: Goal: Knowledge of General Education information will improve Description: Including pain rating scale, medication(s)/side effects and non-pharmacologic comfort measures Outcome: Progressing   Problem: Activity: Goal: Risk for activity intolerance will decrease Outcome: Progressing   Problem: Pain Managment: Goal: General experience of comfort will improve and/or be controlled Outcome: Progressing

## 2023-09-02 NOTE — Progress Notes (Signed)
 [address]       [place] [address]             [phone]     2 Days Post-Op Procedure(s) (LRB): LOBECTOMY, LEFT UPPER LUNG, ROBOT-ASSISTED, USING VATS (Left) LYMPH NODE BIOPSY (Left) BLOCK, NERVE, INTERCOSTAL (Left) Subjective: Anxious at times  Objective: Vital signs in last 24 hours: Temp:  [98 F (36.7 C)-98.4 F (36.9 C)] 98.4 F (36.9 C) (04/26 0711) Pulse Rate:  [79-98] 94 (04/26 0751) Cardiac Rhythm: Normal sinus rhythm (04/26 0700) Resp:  [13-21] 21 (04/26 0751) BP: (97-123)/(61-68) 108/68 (04/26 0711) SpO2:  [96 %-100 %] 96 % (04/26 0751) FiO2 (%):  [24 %] 24 % (04/25 1448)  Hemodynamic parameters for last 24 hours:    Intake/Output from previous day: 04/25 0701 - 04/26 0700 In: 600 [P.O.:600] Out: 1740 [Urine:1400; Chest Tube:340] Intake/Output this shift: No intake/output data recorded.  General appearance: alert, cooperative, and no distress Heart: regular rate and rhythm Lungs: clear to auscultation bilaterally Abdomen: benign Extremities: no edema or calf tenderness Wound: incis healing well  Lab Results: Recent Labs    09/01/23 0247 09/02/23 0223  WBC 10.9* 9.9  HGB 8.6* 8.4*  HCT 26.3* 25.4*  PLT 307 292   BMET:  Recent Labs    09/01/23 0247 09/02/23 0223  NA 136 139  K 4.1 4.6  CL 104 106  CO2 23 27  GLUCOSE 99 102*  BUN 14 9  CREATININE 1.48* 1.06*  CALCIUM  8.9 8.8*    PT/INR: No results for input(s): "LABPROT", "INR" in the last 72 hours. ABG    Component Value Date/Time   HCO3 23.5 08/19/2019 0813   TCO2 25 08/19/2019 0813   ACIDBASEDEF 2.0 08/19/2019 0813   O2SAT 72.0 08/19/2019 0813   CBG (last 3)  No results for input(s): "GLUCAP" in the last 72 hours.  Meds Scheduled Meds:  acetaminophen   1,000 mg Oral Q6H   Or   acetaminophen  (TYLENOL ) oral liquid 160 mg/5 mL  1,000 mg Oral Q6H   aspirin  EC  81 mg Oral Daily   atorvastatin   80 mg Oral QHS   bisacodyl   10 mg Oral Daily    Chlorhexidine  Gluconate Cloth  6 each Topical Q0600   clopidogrel   75 mg Oral QHS   dapagliflozin  propanediol  10 mg Oral QAC breakfast   enoxaparin  (LOVENOX ) injection  30 mg Subcutaneous Q24H   ezetimibe   10 mg Oral Daily   fenofibrate   54 mg Oral Daily   gabapentin   300 mg Oral QHS   ipratropium-albuterol   3 mL Nebulization Q6H   losartan   12.5 mg Oral QHS   magnesium  oxide  400 mg Oral Daily   metoprolol  succinate  25 mg Oral Daily   multivitamin with minerals  1 tablet Oral Daily   nicotine   21 mg Transdermal Daily   pantoprazole   40 mg Oral Daily   rOPINIRole   0.5 mg Oral QHS   senna-docusate  1 tablet Oral QHS   sertraline   50 mg Oral Daily   Continuous Infusions: PRN Meds:.fentaNYL  (SUBLIMAZE ) injection, ondansetron  (ZOFRAN ) IV, oxyCODONE , traMADol , traZODone   Xrays DG Chest 1 View Result Date: 09/02/2023 CLINICAL DATA:  Status post left lobectomy. EXAM: CHEST  1 VIEW COMPARISON:  09/01/2023 FINDINGS: Stable position of left chest tube. No significant pneumothorax identified. Left chest wall ICD noted with lead in the right ventricle. Left lower lobe subsegmental atelectasis is unchanged. New blunting of the right costophrenic angle may reflect  a small effusion. No airspace consolidation or interstitial edema. Status post left shoulder arthroplasty. IMPRESSION: 1. Stable position of left chest tube. No significant pneumothorax identified. 2. New blunting of the right costophrenic angle may reflect a small effusion. Electronically Signed   By: Kimberley Penman M.D.   On: 09/02/2023 08:18   DG Chest Port 1 View Result Date: 09/01/2023 CLINICAL DATA:  Status post lobectomy. EXAM: PORTABLE CHEST 1 VIEW COMPARISON:  Chest radiograph dated 08/31/2023. FINDINGS: Left-sided chest tube in similar position. Improved inspiratory effort. Small left pleural effusion and left lung base atelectasis. No pneumothorax. Stable cardiac silhouette. Left pectoral pacemaker device. No acute osseous  pathology. Left shoulder arthroplasty and left chest wall soft tissue air. IMPRESSION: Small left pleural effusion and left lung base atelectasis. No pneumothorax. Electronically Signed   By: Angus Bark M.D.   On: 09/01/2023 09:41   DG Chest Port 1 View Result Date: 08/31/2023 CLINICAL DATA:  Status post left lobectomy. EXAM: PORTABLE CHEST 1 VIEW COMPARISON:  08/29/2023 FINDINGS: Interval left chest tube with no pneumothorax seen. Subcutaneous emphysema associated with the chest tube inferiorly. Small amount of linear and ill-defined density in the left lower lung zone. Clear right lung. Grossly stable normal sized heart. Stable left subclavian AICD. Diffuse osteopenia. Mild-to-moderate right glenohumeral degenerative changes and left glenohumeral prosthesis. IMPRESSION: 1. Interval left chest tube with no pneumothorax seen. 2. Small amount of left lower lung zone atelectasis and possible pulmonary hemorrhage. Electronically Signed   By: Catherin Closs M.D.   On: 08/31/2023 15:40    Assessment/Plan: S/P Procedure(s) (LRB): LOBECTOMY, LEFT UPPER LUNG, ROBOT-ASSISTED, USING VATS (Left) LYMPH NODE BIOPSY (Left) BLOCK, NERVE, INTERCOSTAL (Left)  1 afeb, VSS, sinus rhythm, occas PVC 2 sats ok on RA 3 CT 340 ml/24h recorded, sero-sang, + small air leak 4 Good UOP 5 renal fxn has significantly improved, creat 1.06 6 no leukocytosis 7 expected ABLA , slightly down from yesterday 8 CXR- clear lung fields, no def evid of pntx, pacer obscures some of left fields 9 clamping chest tube trial initiated 10 poss home later today if able to remove CT 11 cont rehab/pulm hygiene  LOS: 2 days    Lindi Revering PA-C Pager [phone] 09/02/2023

## 2023-09-02 NOTE — Progress Notes (Signed)
 Patient was able to urinate post foley removal. Discharge education completed with patient. PIV removed. Husband to transport home.

## 2023-09-02 NOTE — Plan of Care (Signed)
  Problem: Education: Goal: Knowledge of General Education information will improve Description: Including pain rating scale, medication(s)/side effects and non-pharmacologic comfort measures Outcome: Adequate for Discharge   Problem: Health Behavior/Discharge Planning: Goal: Ability to manage health-related needs will improve Outcome: Adequate for Discharge   Problem: Clinical Measurements: Goal: Ability to maintain clinical measurements within normal limits will improve Outcome: Adequate for Discharge Goal: Will remain free from infection Outcome: Adequate for Discharge Goal: Diagnostic test results will improve Outcome: Adequate for Discharge Goal: Respiratory complications will improve Outcome: Adequate for Discharge Goal: Cardiovascular complication will be avoided Outcome: Adequate for Discharge   Problem: Activity: Goal: Risk for activity intolerance will decrease 09/02/2023 1143 by Mateo Solid, RN Outcome: Adequate for Discharge 09/02/2023 0856 by Mateo Solid, RN Outcome: Progressing   Problem: Nutrition: Goal: Adequate nutrition will be maintained 09/02/2023 1143 by Mateo Solid, RN Outcome: Adequate for Discharge 09/02/2023 0856 by Mateo Solid, RN Outcome: Progressing   Problem: Coping: Goal: Level of anxiety will decrease 09/02/2023 1143 by Mateo Solid, RN Outcome: Adequate for Discharge 09/02/2023 0856 by Mateo Solid, RN Outcome: Progressing   Problem: Elimination: Goal: Will not experience complications related to bowel motility 09/02/2023 1143 by Mateo Solid, RN Outcome: Adequate for Discharge 09/02/2023 0856 by Mateo Solid, RN Outcome: Progressing Goal: Will not experience complications related to urinary retention 09/02/2023 1143 by Mateo Solid, RN Outcome: Adequate for Discharge 09/02/2023 0856 by Mateo Solid, RN Outcome: Progressing   Problem: Pain Managment: Goal: General experience of comfort  will improve and/or be controlled 09/02/2023 1143 by Mateo Solid, RN Outcome: Adequate for Discharge 09/02/2023 0856 by Mateo Solid, RN Outcome: Progressing   Problem: Safety: Goal: Ability to remain free from injury will improve 09/02/2023 1143 by Mateo Solid, RN Outcome: Adequate for Discharge 09/02/2023 0856 by Mateo Solid, RN Outcome: Progressing   Problem: Skin Integrity: Goal: Risk for impaired skin integrity will decrease 09/02/2023 1143 by Mateo Solid, RN Outcome: Adequate for Discharge 09/02/2023 0856 by Mateo Solid, RN Outcome: Progressing   Problem: Education: Goal: Knowledge of disease or condition will improve Outcome: Adequate for Discharge Goal: Knowledge of the prescribed therapeutic regimen will improve Outcome: Adequate for Discharge   Problem: Activity: Goal: Risk for activity intolerance will decrease Outcome: Adequate for Discharge   Problem: Cardiac: Goal: Will achieve and/or maintain hemodynamic stability Outcome: Adequate for Discharge   Problem: Clinical Measurements: Goal: Postoperative complications will be avoided or minimized Outcome: Adequate for Discharge   Problem: Respiratory: Goal: Respiratory status will improve Outcome: Adequate for Discharge   Problem: Pain Management: Goal: Pain level will decrease Outcome: Adequate for Discharge   Problem: Skin Integrity: Goal: Wound healing without signs and symptoms infection will improve Outcome: Adequate for Discharge

## 2023-09-02 NOTE — Plan of Care (Signed)

## 2023-09-04 ENCOUNTER — Ambulatory Visit (HOSPITAL_COMMUNITY): Admitting: Registered Nurse

## 2023-09-04 ENCOUNTER — Other Ambulatory Visit: Payer: Self-pay | Admitting: *Deleted

## 2023-09-04 DIAGNOSIS — I7143 Infrarenal abdominal aortic aneurysm, without rupture: Secondary | ICD-10-CM

## 2023-09-04 LAB — SURGICAL PATHOLOGY

## 2023-09-06 ENCOUNTER — Encounter: Payer: Self-pay | Admitting: Cardiology

## 2023-09-07 ENCOUNTER — Ambulatory Visit: Attending: Vascular Surgery | Admitting: Vascular Surgery

## 2023-09-07 ENCOUNTER — Encounter: Payer: Self-pay | Admitting: Vascular Surgery

## 2023-09-07 VITALS — BP 88/61 | HR 72 | Temp 98.1°F | Resp 20 | Ht 60.0 in | Wt 109.0 lb

## 2023-09-07 DIAGNOSIS — I7143 Infrarenal abdominal aortic aneurysm, without rupture: Secondary | ICD-10-CM

## 2023-09-07 NOTE — Progress Notes (Signed)
 Office Note     CC: 3 cm AAA Requesting Provider:  Suzette Espy, MD  HPI: Erin  Erin [name] is a 63 y.o. ([birth year]) female presenting at the request of .[name], MD for AAA.  On exam, Erin [name]  was doing well.  A native of [place] , she now lives in [place] .  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  She is now retired and enjoys spending time with her kids and grandkids.  She has 4 daughters, 1 of which is deceased, 40 grandchildren, and 3 great-grandchildren, 1 who is deceased.  She has 7 of these grandchildren in her home.  Erin [name]  denies history of claudication, ischemic rest pain, tissue loss.  She was a previous smoker but stopped years ago. She denies abdominal, back, chest pain.  The AAA was found incidentally by CTA in the emergency department performed for abdominal pain. Surgical history includes 08/31/2023 left upper lobe lobectomy for adenocarcinoma.  EF noted to be 35%.   Past Medical History:  Diagnosis Date   AICD (automatic cardioverter/defibrillator) present    Allergic reaction to alpha-gal    Allergy 03/05/2022   Anemia    Anxiety state 03/25/2016   Arthritis    Basal cell carcinoma of forehead    Brain aneurysm    CHF (congestive heart failure) (HCC)    Coronary artery disease    a. 03/11/16 PCI with DES-->Prox/Mid Cx;  b. 03/14/16 PCI with DES x2-->RCA, EF 30-35%.   Depression 06/09/2010   Encounter for general adult medical examination with abnormal findings 05/04/2022   Essential hypertension    Hx   GERD (gastroesophageal reflux disease)    HFrEF (heart failure with reduced ejection fraction) (HCC)    a. 10/2016 Echo: EF 35-40%, Gr1 DD, mild focal basal septal hypertrophy, basal inflat, mid inflat, basal antlat AK. Mid infept/inf/antlat, apical lateral sev HK. Mod MR. mildly reduced RV fxn. Mild TR.   History of pneumonia    Hyperlipidemia    IBS (irritable bowel syndrome)    Ischemic cardiomyopathy    a. 10/2016  Echo: EF 35-40%, Gr1 DD.   Lung cancer [facility])    Mitral regurgitation    Neuromuscular disorder (HCC)    NSTEMI (non-ST elevated myocardial infarction) (HCC) 03/10/2016   Pneumonia 03/2016   Shingles    Squamous cell cancer of skin of nose    Thrombocytosis 03/26/2016   Tobacco abuse    Trichimoniasis    Wears dentures    Wears glasses     Past Surgical History:  Procedure Laterality Date   APPENDECTOMY     BICEPT TENODESIS Left 03/21/2023   Procedure: BICEPS TENODESIS;  Surgeon: Jasmine Mesi, MD;  Location: [facility] OR;  Service: Orthopedics;  Laterality: Left;   BIOPSY  09/20/2018   Procedure: BIOPSY;  Surgeon: Suzette Espy, MD;  [facility];  Service: Endoscopy;;  colon   BIOPSY  01/05/2021   Procedure: BIOPSY;  Surgeon: Janel Medford, MD;  Location: [facility] ENDOSCOPY;  Service: Endoscopy;;   BIOPSY  04/27/2022   Procedure: BIOPSY;  Surgeon: Vinetta Greening, DO;  [facility];  Service: Endoscopy;;   BRONCHIAL BIOPSY  07/03/2023   Procedure: BRONCHIAL BIOPSIES;  Surgeon: Denson Flake, MD;  Location: [facility] ENDOSCOPY;  Service: Pulmonary;;   BRONCHIAL BRUSHINGS  07/03/2023   Procedure: BRONCHIAL BRUSHINGS;  Surgeon: Denson Flake, MD;  Location: [facility] ENDOSCOPY;  Service: Pulmonary;;   BRONCHIAL NEEDLE ASPIRATION BIOPSY  07/03/2023  Procedure: BRONCHIAL NEEDLE ASPIRATION BIOPSIES;  Surgeon: Denson Flake, MD;  Location: [facility] ENDOSCOPY;  Service: Pulmonary;;   BRONCHIAL WASHINGS  07/03/2023   Procedure: BRONCHIAL WASHINGS;  Surgeon: Denson Flake, MD;  Location[facility] ENDOSCOPY;  Service: Pulmonary;;   CARDIAC CATHETERIZATION N/A 03/11/2016   Procedure: Left Heart Cath and Coronary Angiography;  Surgeon: Arleen Lacer, MD;  Location: [facility] INVASIVE CV LAB;  Service: Cardiovascular;  Laterality: N/A;   CARDIAC CATHETERIZATION N/A 03/11/2016   Procedure: Coronary Stent Intervention;  Surgeon: Arleen Lacer, MD;  Location: [facility] INVASIVE CV LAB;  Service:  Cardiovascular;  Laterality: N/A;   CARDIAC CATHETERIZATION N/A 03/14/2016   Procedure: Coronary Stent Intervention;  Surgeon: Peter M Swaziland, MD;  Location: [facility] INVASIVE CV LAB;  Service: Cardiovascular;  Laterality: N/A;   CEREBRAL ANEURYSM REPAIR  04/2019   stent placed   CHOLECYSTECTOMY OPEN  1984   COLONOSCOPY N/A 08/28/2023   Procedure: COLONOSCOPY;  Surgeon: Vinetta Greening, DO;  [facility];  Service: Endoscopy;  Laterality: N/A;  1030am, asa 3, has defibrillator   COLONOSCOPY WITH PROPOFOL  N/A 09/20/2018   Procedure: COLONOSCOPY WITH PROPOFOL ;  Surgeon: Suzette Espy, MD;  [facility];  Service: Endoscopy;  Laterality: N/A;  10:30am   CORONARY ANGIOPLASTY WITH STENT PLACEMENT  03/14/2016   ESOPHAGOGASTRODUODENOSCOPY (EGD) WITH PROPOFOL  N/A 09/20/2018   Procedure: ESOPHAGOGASTRODUODENOSCOPY (EGD) WITH PROPOFOL ;  Surgeon: Suzette Espy, MD;  [facility];  Service: Endoscopy;  Laterality: N/A;   ESOPHAGOGASTRODUODENOSCOPY (EGD) WITH PROPOFOL  N/A 01/05/2021   Procedure: ESOPHAGOGASTRODUODENOSCOPY (EGD) WITH PROPOFOL ;  Surgeon: Janel Medford, MD;  Location: [facility] ENDOSCOPY;  Service: Endoscopy;  Laterality: N/A;   ESOPHAGOGASTRODUODENOSCOPY (EGD) WITH PROPOFOL  N/A 04/27/2022   Procedure: ESOPHAGOGASTRODUODENOSCOPY (EGD) WITH PROPOFOL ;  Surgeon: Vinetta Greening, DO;  [facility];  Service: Endoscopy;  Laterality: N/A;  9:15am, asa 3/4, ASAP   ESOPHAGOGASTRODUODENOSCOPY (EGD) WITH PROPOFOL  N/A 01/04/2023   Procedure: ESOPHAGOGASTRODUODENOSCOPY (EGD) WITH PROPOFOL ;  Surgeon: Suzette Espy, MD;  [facility];  Service: Endoscopy;  Laterality: N/A;  130pm, asa 3   FIDUCIAL MARKER PLACEMENT  07/03/2023   Procedure: FIDUCIAL MARKER PLACEMENT;  Surgeon: Denson Flake, MD;  Location: [facility] ENDOSCOPY;  Service: Pulmonary;;   FINGER ARTHROPLASTY Left 05/14/2013   Procedure: LEFT THUMB CARPAL METACARPAL ARTHROPLASTY;  Surgeon: Milagros Alf, MD;  Location: [facility];  Service: Orthopedics;  Laterality: Left;   ICD IMPLANT N/A 04/03/2020   Procedure: ICD IMPLANT;  Surgeon: Lei Pump, MD;  Location: [facility] INVASIVE CV LAB;  Service: Cardiovascular;  Laterality: N/A;   INTERCOSTAL NERVE BLOCK Left 08/31/2023   Procedure: BLOCK, NERVE, INTERCOSTAL;  Surgeon: Hilarie Lovely, MD;  Location: [facility] OR;  Service: Thoracic;  Laterality: Left;   IR ANGIO INTRA EXTRACRAN SEL COM CAROTID INNOMINATE BILAT MOD SED  01/05/2017   IR ANGIO INTRA EXTRACRAN SEL COM CAROTID INNOMINATE BILAT MOD SED  03/19/2019   IR ANGIO INTRA EXTRACRAN SEL COM CAROTID INNOMINATE BILAT MOD SED  06/04/2020   IR ANGIO INTRA EXTRACRAN SEL INTERNAL CAROTID UNI L MOD SED  04/15/2019   IR ANGIO VERTEBRAL SEL VERTEBRAL BILAT MOD SED  01/05/2017   IR ANGIO VERTEBRAL SEL VERTEBRAL BILAT MOD SED  03/19/2019   IR ANGIO VERTEBRAL SEL VERTEBRAL UNI L MOD SED  06/04/2020   IR ANGIOGRAM FOLLOW UP STUDY  04/15/2019   IR RADIOLOGIST EVAL & MGMT  12/30/2016   IR TRANSCATH/EMBOLIZ  04/15/2019  IR US  GUIDE VASC ACCESS RIGHT  03/19/2019   IR US  GUIDE VASC ACCESS RIGHT  06/04/2020   JOINT REPLACEMENT     LOBECTOMY, LUNG, ROBOT-ASSISTED, USING VATS Left 08/31/2023   Procedure: LOBECTOMY, LEFT UPPER LUNG, ROBOT-ASSISTED, USING VATS;  Surgeon: Hilarie Lovely, MD;  Location: [facility] OR;  Service: Thoracic;  Laterality: Left;  Left Robotic Assisted Thoracoscopy for Left Upper Lobectomy   LYMPH NODE BIOPSY Left 08/31/2023   Procedure: LYMPH NODE BIOPSY;  Surgeon: Hilarie Lovely, MD;  Location: [facility] OR;  Service: Thoracic;  Laterality: Left;   MALONEY DILATION N/A 09/20/2018   Procedure: Londa Rival DILATION;  Surgeon: Suzette Espy, MD;  [facility];  Service: Endoscopy;  Laterality: N/A;   MALONEY DILATION N/A 01/04/2023   Procedure: Londa Rival DILATION;  Surgeon: Suzette Espy, MD;  [facility];  Service: Endoscopy;  Laterality: N/A;    RADIOLOGY WITH ANESTHESIA N/A 04/15/2019   Procedure: [place];  Surgeon: Luellen Sages, MD;  Location: [facility] OR;  Service: Radiology;  Laterality: N/A;   REVERSE SHOULDER ARTHROPLASTY Left 03/21/2023   Procedure: LEFT REVERSE SHOULDER ARTHROPLASTY;  Surgeon: Jasmine Mesi, MD;  Location: [facility] OR;  Service: Orthopedics;  Laterality: Left;   RIGHT/LEFT HEART CATH AND CORONARY ANGIOGRAPHY N/A 08/19/2019   Procedure: RIGHT/LEFT HEART CATH AND CORONARY ANGIOGRAPHY;  Surgeon: Darlis Eisenmenger, MD;  Location: [facility] INVASIVE CV LAB;  Service: Cardiovascular;  Laterality: N/A;   TUBAL LIGATION  1987   VAGINAL HYSTERECTOMY  2009    Social History   Socioeconomic History   Marital status: Married    Spouse name: Not on file   Number of children: Not on file   Years of education: Not on file   Highest education level: Associate degree: occupational, Scientist, product/process development, or vocational program  Occupational History   Occupation: CNA  Tobacco Use   Smoking status: Former    Current packs/day: 1.50    Average packs/day: 1.5 packs/day for 40.0 years (60.0 ttl pk-yrs)    Types: Cigarettes   Smokeless tobacco: Never   Tobacco comments:    Smokes off and on 06/14/23 Cala Castleman, CMA     No cigarettes since Thursday this week>> 07/06/2023  Vaping Use   Vaping status: Never Used  Substance and Sexual Activity   Alcohol use: Not Currently    Comment: occasionally   Drug use: Not Currently    Types: Marijuana   Sexual activity: Not Currently    Birth control/protection: Surgical    Comment: hyst  Other Topics Concern   Not on file  Social History Narrative   Lives with husband in [place] in a one story home with a basement.  Has 4 children.  Works as a Lawyer.  Education: CNA school.    Social Drivers of Corporate investment banker Strain: Low Risk  (04/20/2023)   Overall Financial Resource Strain (CARDIA)    Difficulty of Paying Living Expenses: Not hard at all  Food Insecurity: No Food  Insecurity (09/01/2023)   Hunger Vital Sign    Worried About Running Out of Food in the Last Year: Never true    Ran Out of Food in the Last Year: Never true  Transportation Needs: No Transportation Needs (09/01/2023)   PRAPARE - Administrator, Civil Service (Medical): No    Lack of Transportation (Non-Medical): No  Physical Activity: Insufficiently Active (04/20/2023)   Exercise Vital Sign    Days of Exercise per Week: 3 days  Minutes of Exercise per Session: 30 min  Stress: Stress Concern Present (04/20/2023)   [employer] of Occupational Health - Occupational Stress Questionnaire    Feeling of Stress : Very much  Social Connections: Socially Isolated (04/20/2023)   Social Connection and Isolation Panel [NHANES]    Frequency of Communication with Friends and Family: Once a week    Frequency of Social Gatherings with Friends and Family: Once a week    Attends Religious Services: Never    Database administrator or Organizations: No    Attends Engineer, structural: Not on file    Marital Status: Married  Catering manager Violence: Not At Risk (09/01/2023)   Humiliation, Afraid, Rape, and Kick questionnaire    Fear of Current or Ex-Partner: No    Emotionally Abused: No    Physically Abused: No    Sexually Abused: No   Family History  Problem Relation Age of Onset   Stroke Mother    Hypertension Mother    Diabetes Mother    Heart attack Mother    Heart attack Father    Diabetes Father    Hypertension Father    CAD Father    Colon polyps Father 27       pre-cancerous    Stroke Father    Dementia Father    Hyperlipidemia Father    Arthritis Father    COPD Father        smoked and worked Archivist mills   Heart disease Father    Breast cancer Maternal Grandmother    Diabetes Maternal Grandmother    Cancer Maternal Grandfather        Tongue and esophageal   Cancer Paternal Grandmother    Anxiety disorder Daughter    Depression Daughter     Anxiety disorder Daughter    Heart failure Other    Colon cancer Neg Hx     Current Outpatient Medications  Medication Sig Dispense Refill   aspirin  EC 81 MG tablet Take 1 tablet (81 mg total) by mouth every evening. Okay to restart on 2/29/2025 (Patient taking differently: Take 81 mg by mouth daily. Okay to restart on 2/29/2025)     atorvastatin  (LIPITOR ) 80 MG tablet Take 1 tablet (80 mg total) by mouth daily. (Patient taking differently: Take 80 mg by mouth at bedtime.) 90 tablet 3   clopidogrel  (PLAVIX ) 75 MG tablet Take 1 tablet (75 mg total) by mouth daily. Okay to restart on 07/04/2023 (Patient taking differently: Take 75 mg by mouth at bedtime.)     dapagliflozin  propanediol (FARXIGA ) 10 MG TABS tablet Take 1 tablet (10 mg total) by mouth daily before breakfast. 30 tablet 11   EPINEPHrine  (EPIPEN  2-PAK) 0.3 mg/0.3 mL IJ SOAJ injection Inject 0.3 mg into the muscle as needed for anaphylaxis. 2 each 2   Evolocumab  (REPATHA  SURECLICK) 140 MG/ML SOAJ Inject 140 mg into the skin every 14 (fourteen) days. 6 mL 3   ezetimibe  (ZETIA ) 10 MG tablet Take 1 tablet (10 mg total) by mouth daily. 90 tablet 3   fenofibrate  (TRICOR ) 145 MG tablet Take 1 tablet (145 mg total) by mouth daily. 90 tablet 3   furosemide  (LASIX ) 20 MG tablet Take 40 mg in am and 20 mg in pm 270 tablet 3   gabapentin  (NEURONTIN ) 300 MG capsule Take 1 capsule by mouth at bedtime 30 capsule 0   GAVILYTE-N  WITH FLAVOR PACK 420 g solution Take 4,000 mLs by mouth once.     hydrOXYzine  (VISTARIL ) 25  MG capsule TAKE 1 CAPSULE BY MOUTH EVERY 8 HOURS AS NEEDED 30 capsule 0   linaclotide  (LINZESS ) 145 MCG CAPS capsule Take 1 capsule (145 mcg total) by mouth daily before breakfast. (Patient taking differently: Take 145 mcg by mouth daily as needed (consipation).) 30 capsule 11   losartan  (COZAAR ) 25 MG tablet Take 0.5 tablets (12.5 mg total) by mouth at bedtime.     MAGNESIUM -OXIDE 400 (240 Mg) MG tablet Take 1 tablet by mouth daily.      metoprolol  succinate (TOPROL  XL) 25 MG 24 hr tablet Take 1 tablet (25 mg total) by mouth at bedtime. 90 tablet 3   Multiple Vitamins-Minerals (MULTIVITAMIN WITH MINERALS) tablet Take 1 tablet by mouth daily.     nicotine  (NICODERM CQ  - DOSED IN MG/24 HOURS) 21 mg/24hr patch Place 21 mg onto the skin daily.     nitroGLYCERIN  (NITROSTAT ) 0.4 MG SL tablet Place 1 tablet (0.4 mg total) under the tongue every 5 (five) minutes x 3 doses as needed for chest pain (if no relief after 2nd dose, proceed to the ED for an evaluation or call 911). 25 tablet 2   ondansetron  (ZOFRAN ) 8 MG tablet Take 1 tablet (8 mg total) by mouth every 8 (eight) hours as needed for nausea or vomiting. (Patient taking differently: Take 8 mg by mouth daily as needed for nausea or vomiting.) 90 tablet 3   oxyCODONE  (OXY IR/ROXICODONE ) 5 MG immediate release tablet Take 1 tablet (5 mg total) by mouth every 6 (six) hours as needed for up to 7 days for moderate pain (pain score 4-6). 28 tablet 0   pantoprazole  (PROTONIX ) 40 MG tablet Take 40 mg by mouth daily.     potassium chloride  SA (KLOR-CON  M) 20 MEQ tablet Take 2 tablets (40 mEq total) by mouth 2 (two) times daily. 120 tablet 6   rOPINIRole  (REQUIP ) 0.5 MG tablet Take 0.5 mg by mouth at bedtime.   0   sertraline  (ZOLOFT ) 50 MG tablet Take 1 tablet (50 mg total) by mouth daily. 30 tablet 3   spironolactone  (ALDACTONE ) 25 MG tablet Take 1 tablet (25 mg total) by mouth at bedtime. 30 tablet 3   traZODone  (DESYREL ) 50 MG tablet Take 1 tablet (50 mg total) by mouth at bedtime. (Patient taking differently: Take 50 mg by mouth at bedtime as needed for sleep.) 90 tablet 1   Vitamin D , Ergocalciferol , (DRISDOL) 1.25 MG (50000 UNIT) CAPS capsule Take 50,000 Units by mouth every 7 (seven) days. Mondays     No current facility-administered medications for this visit.    Allergies  Allergen Reactions   Alpha-Gal Shortness Of Breath, Nausea And Vomiting and Dermatitis   Other Shortness Of  Breath, Diarrhea, Nausea And Vomiting and Nausea Only    All- red meats/dairy  Medications in Capsule form    Beef-Derived Drug Products Diarrhea and Nausea And Vomiting   Chantix  [Varenicline ] Nausea Only   Pork-Derived Products Diarrhea and Nausea And Vomiting   Reglan  [Metoclopramide ]     Involuntary movements   Tape Other (See Comments)    PEELS SKIN OFF  (PAPER TAPE IS FINE)     REVIEW OF SYSTEMS:  [X]  denotes positive finding, [ ]  denotes negative finding Cardiac  Comments:  Chest pain or chest pressure:    Shortness of breath upon exertion:    Short of breath when lying flat:    Irregular heart rhythm:        Vascular    Pain in calf, thigh, or  hip brought on by ambulation:    Pain in feet at night that wakes you up from your sleep:     Blood clot in your veins:    Leg swelling:         Pulmonary    Oxygen  at home:    Productive cough:     Wheezing:         Neurologic    Sudden weakness in arms or legs:     Sudden numbness in arms or legs:     Sudden onset of difficulty speaking or slurred speech:    Temporary loss of vision in one eye:     Problems with dizziness:         Gastrointestinal    Blood in stool:     Vomited blood:         Genitourinary    Burning when urinating:     Blood in urine:        Psychiatric    Major depression:         Hematologic    Bleeding problems:    Problems with blood clotting too easily:        Skin    Rashes or ulcers:        Constitutional    Fever or chills:      PHYSICAL EXAMINATION:  Vitals:   09/07/23 0948  BP: (!) 88/61  Pulse: 72  Resp: 20  Temp: 98.1 F (36.7 C)  TempSrc: Temporal  SpO2: 98%  Weight: 109 lb (49.4 kg)  Height: 5' (1.524 m)    General:  WDWN in NAD; vital signs documented above, appears older than stated age Gait: Not observed HENT: WNL, normocephalic Pulmonary: normal non-labored breathing  Cardiac: regular HR Abdomen: soft, NT, no masses Skin: without rashes Vascular  Exam/Pulses:  Right Left  Radial 2+ (normal) 2+ (normal)  Ulnar    Femoral    Popliteal    DP absent absent  PT     Extremities: without ischemic changes, without Gangrene , without cellulitis; without open wounds;  Musculoskeletal: no muscle wasting or atrophy  Neurologic: A&O X 3;  No focal weakness or paresthesias are detected Psychiatric:  The pt has Normal affect.   Non-Invasive Vascular Imaging:   See CT scan    ASSESSMENT/PLAN: Folashade  YENDI TOON is a 63 y.o. female presenting with 3.3 cm AAA.    I independently reviewed the CT angio abdomen pelvis from August 30, 2023, as well as a previous CT scan performed in June 2020.  There is been 11 mm of growth over the last 5 years.  The small aneurysm does have some features that are atypical.  There appears to be 2 walls present, as well as an area of ulceration.  Possibly a previous intramural hematoma that has degenerated.  Also noted are bilateral accessory renal arteries.  Kidneys appeared mildly abnormal.  Small iliac system bilaterally.  Regardless, I think for Aldora  would be best served with close interval follow-up at 6 months, followed by yearly ultrasound as long as there are no significant changes.  My plan is to treat this like a normal infrarenal abdominal aneurysm from a size standpoint.  I would intervene if growth is greater than half a centimeter in 6 months, 1 cm in a year, or greater than 5 cm in the future.  Study from 20/2024 demonstrates moderate left-sided ICA stenosis, mild right-sided ICA stenosis.  Will follow this on a yearly basis as well.   Melodie Spry  Orlin Black, MD [facility] [phone] Total time of patient care including pre-visit research, consultation, and documentation greater than 45 minutes

## 2023-09-11 ENCOUNTER — Encounter

## 2023-09-12 ENCOUNTER — Telehealth: Payer: Self-pay

## 2023-09-12 ENCOUNTER — Ambulatory Visit: Payer: Medicare Other | Admitting: Dermatology

## 2023-09-12 NOTE — Telephone Encounter (Signed)
 Attempted ICM Call to mobile phone number and is disconnected.  Attempted call to home number and left message to send manual remote transmission to check fluid levels.  Left number for call back.

## 2023-09-14 ENCOUNTER — Other Ambulatory Visit: Payer: Self-pay | Admitting: *Deleted

## 2023-09-14 ENCOUNTER — Other Ambulatory Visit: Payer: Self-pay | Admitting: Thoracic Surgery (Cardiothoracic Vascular Surgery)

## 2023-09-14 ENCOUNTER — Other Ambulatory Visit: Payer: Self-pay

## 2023-09-14 DIAGNOSIS — I7143 Infrarenal abdominal aortic aneurysm, without rupture: Secondary | ICD-10-CM

## 2023-09-14 DIAGNOSIS — C3412 Malignant neoplasm of upper lobe, left bronchus or lung: Secondary | ICD-10-CM

## 2023-09-14 DIAGNOSIS — I739 Peripheral vascular disease, unspecified: Secondary | ICD-10-CM

## 2023-09-14 NOTE — Telephone Encounter (Signed)
 No ICM Remote transmission received and rescheduled for 5/27.

## 2023-09-14 NOTE — Progress Notes (Signed)
      [address]       [place] [address]             [phone]        [name] Medical Record #[id number] Date of Birth: [birth year]  Referring: [name], MD Primary Care: [name], MD Primary Cardiologist:Dalton Mitzie Anda, MD  Reason for visit:   follow-up  History of Present Illness:     Erin  J [name] presents for their first follow-up appointment.  Overall, she is doing well.    Physical Exam: There were no vitals taken for this visit.  Alert NAD Incision clean.   Abdomen, ND no peripheral edema   Diagnostic Studies & Laboratory data:  Path:  FINAL MICROSCOPIC DIAGNOSIS:  A. LYMPH NODE, LEVEL 9, EXCISION: - Lymph node, negative for carcinoma (0/1)  B. LYMPH NODE, HILAR, EXCISION: - Lymph node, negative for carcinoma (0/1)  C. LYMPH NODE, LEVEL 5, EXCISION: - Lymph node, negative for carcinoma (0/1)  D. LUNG, LEFT UPPER LOBE, LOBECTOMY: - Invasive moderately differentiated lung adenocarcinoma, 2.2 cm, acinar predominant - Visceral pleura is not involved - Resection margins are negative for carcinoma - Five benign lymph nodes, negative for carcinoma (0/5) - See oncology table     ONCOLOGY TABLE:  LUNG: Resection  Synchronous Tumors: Not applicable Total Number of Primary Tumors: 1 Procedure: Lobectomy, lung Specimen Laterality: Left Tumor Focality: Unifocal Tumor Site: Upper lobe Tumor Size: 2.2 cm Histologic Type: Adenocarcinoma, acinar predominant Visceral Pleura Invasion: Not identified Direct Invasion of Adjacent Structures: No adjacent structures present Lymphovascular Invasion: Not identified Margins: All margins negative for invasive carcinoma      Closest Margin(s) to Invasive Carcinoma: Bronchovascular margin      Margin(s) Involved by Invasive Carcinoma: Not applicable       Margin Status for Non-Invasive Tumor: Not applicable Treatment Effect: No known presurgical  therapy Regional Lymph Nodes:      Number of Lymph Nodes Involved: 0                           Nodal Sites with Tumor: Not applicable      Number of Lymph Nodes Examined: 8                      Nodal Sites Examined: Levels 5, 9 and 10 Distant Metastasis:      Distant Site(s) Involved: Not applicable Pathologic Stage Classification (pTNM, AJCC 8th Edition): pT1b, pN0     Assessment / Plan:   63yo female s/p Left upper lobectomy for  T1bN0M0 adenocarcinoma.  I have made a referral to medical oncology, along with a 1 month follow-up with a CXR.  Refill for tramadol  given.   Hilarie Lovely 09/14/2023 2:18 PM

## 2023-09-15 ENCOUNTER — Encounter: Payer: Self-pay | Admitting: Thoracic Surgery (Cardiothoracic Vascular Surgery)

## 2023-09-15 ENCOUNTER — Ambulatory Visit
Payer: Self-pay | Attending: Thoracic Surgery (Cardiothoracic Vascular Surgery) | Admitting: Thoracic Surgery (Cardiothoracic Vascular Surgery)

## 2023-09-15 ENCOUNTER — Ambulatory Visit (HOSPITAL_COMMUNITY)
Admission: RE | Admit: 2023-09-15 | Discharge: 2023-09-15 | Disposition: A | Discharge status: Home / Self Care | Source: Ambulatory Visit | Attending: Cardiology | Admitting: Cardiology

## 2023-09-15 VITALS — BP 102/68 | HR 87 | Resp 20 | Ht 60.0 in | Wt 110.4 lb

## 2023-09-15 DIAGNOSIS — Z902 Acquired absence of lung [part of]: Secondary | ICD-10-CM

## 2023-09-15 DIAGNOSIS — C3412 Malignant neoplasm of upper lobe, left bronchus or lung: Secondary | ICD-10-CM | POA: Insufficient documentation

## 2023-09-15 DIAGNOSIS — Z471 Aftercare following joint replacement surgery: Secondary | ICD-10-CM | POA: Diagnosis not present

## 2023-09-15 DIAGNOSIS — Z96612 Presence of left artificial shoulder joint: Secondary | ICD-10-CM | POA: Diagnosis not present

## 2023-09-15 DIAGNOSIS — C3492 Malignant neoplasm of unspecified part of left bronchus or lung: Secondary | ICD-10-CM | POA: Diagnosis not present

## 2023-09-15 MED ORDER — TRAMADOL HCL 50 MG PO TABS: 50.0000 mg | ORAL_TABLET | Freq: Four times a day (QID) | ORAL | 0 refills | Status: DC | PRN

## 2023-09-15 NOTE — Progress Notes (Signed)
 The proposed treatment discussed in conference is for discussion purpose only and is not a binding recommendation.  The patients have not been physically examined, or presented with their treatment options.  Therefore, final treatment plans cannot be decided.

## 2023-09-16 ENCOUNTER — Other Ambulatory Visit: Payer: Self-pay | Admitting: Gastroenterology

## 2023-09-16 ENCOUNTER — Other Ambulatory Visit: Payer: Self-pay | Admitting: Internal Medicine

## 2023-09-16 ENCOUNTER — Other Ambulatory Visit: Payer: Self-pay | Admitting: Cardiology

## 2023-09-16 DIAGNOSIS — F410 Panic disorder [episodic paroxysmal anxiety] without agoraphobia: Secondary | ICD-10-CM

## 2023-09-16 DIAGNOSIS — G6289 Other specified polyneuropathies: Secondary | ICD-10-CM

## 2023-09-18 ENCOUNTER — Ambulatory Visit: Payer: Medicare Other | Admitting: Dermatology

## 2023-09-28 ENCOUNTER — Other Ambulatory Visit: Payer: Self-pay | Admitting: Cardiology

## 2023-09-28 ENCOUNTER — Encounter (HOSPITAL_COMMUNITY): Admitting: Cardiology

## 2023-09-28 DIAGNOSIS — I5022 Chronic systolic (congestive) heart failure: Secondary | ICD-10-CM

## 2023-09-29 NOTE — Progress Notes (Signed)
 Remote ICD transmission.

## 2023-10-03 ENCOUNTER — Ambulatory Visit: Attending: Cardiology

## 2023-10-03 ENCOUNTER — Other Ambulatory Visit: Payer: Self-pay | Admitting: Internal Medicine

## 2023-10-03 ENCOUNTER — Other Ambulatory Visit: Payer: Self-pay | Admitting: Cardiology

## 2023-10-03 DIAGNOSIS — I5042 Chronic combined systolic (congestive) and diastolic (congestive) heart failure: Secondary | ICD-10-CM | POA: Diagnosis not present

## 2023-10-03 DIAGNOSIS — F410 Panic disorder [episodic paroxysmal anxiety] without agoraphobia: Secondary | ICD-10-CM

## 2023-10-03 DIAGNOSIS — Z95810 Presence of automatic (implantable) cardiac defibrillator: Secondary | ICD-10-CM | POA: Diagnosis not present

## 2023-10-04 MED ORDER — FUROSEMIDE 20 MG PO TABS: 40.0000 mg | ORAL_TABLET | Freq: Two times a day (BID) | ORAL | 2 refills | Status: DC

## 2023-10-04 NOTE — Progress Notes (Signed)
 EPIC Encounter for ICM Monitoring  Patient Name: Erin  LILLIS [name] is a 63 y.o. female Date: 10/04/2023 Primary Care Physican: [name], MD Primary Cardiologist: Mitzie Anda Electrophysiologist: Lawana Pray 08/11/2021 Office Weight: 121 lbs. 03/25/2022 Office Weight 125-127 lbs 03/13/2023 Weight: 110 lbs 03/24/2023 Weight: 115 lbs 05/26/2023 Weight: 105 lbs 07/10/2023 Weight: 103 lbs 09/15/2023 Office Weight: 110 lbs 10/04/2023 Weight: 107 lbs                                                          Spoke with patient and heart failure questions reviewed.  Transmission results reviewed.  Pt reports increased in SOB recently and stays nauseated all the time (which started 11/2022).  Lobectomy surgery was on 4/24   Diet:  Does not have much appetite due to chronic nausea and drinks about 64 oz fluid daily.    Optivol thoracic impedance suggesting possible fluid accumulation starting 4/18 (correlates with hospitalizations in June).  Fluid index > normal threshold starting 4/25.   Prescribed: Furosemide  20 mg Take 2 tablets (40 mg total) by mouth every AM and 1 tablet (20 mg total) every evening Potassium 20 mEq take 2 tablets (40 mEq total) by mouth twice a day Spironolactone  25 mg take 1 tablet (12.5 mg total) by mouth at bedtime   Labs: 09/02/2023 Creatinine 1.06, BUN   9, Potassium 4.6, Sodium 139, GFR 60  09/01/2023 Creatinine 1.48, BUN 14, Potassium 4.1, Sodium 136, GFR 40  08/29/2023 Creatinine 1.29, BUN 13, Potassium 4.3, Sodium 140, GFR 47  08/15/2023 Creatinine 1.58, BUN 25, Potassium 4.6, Sodium 136  07/31/2023 Creatinine 1.48, BUN 21, Potassium 3.9, Sodium 138, GFR 40  07/18/2023 Creatinine 1.38, BUN 16, Potassium 4.2, Sodium 140  07/08/2023 Creatinine 1.7,   BUN 22, Potassium 3.8, Sodium 140, GFR 34, BNP 491 07/07/2023 Creatinine 1.38, BUN 23, Potassium 3.7, Sodium 135, GFR 44  06/18/2023 Creatinine 1.19, BUN 13, Potassium 3.5, Sodium 138, GFR 52  05/26/2023 Creatinine 1.85, BUN 16,  Potassium 5.0, Sodium 133, GFR 31 A complete set of results can be found in Results Review.   Recommendations:  Sent to Dr Mitzie Anda for review and recommendations.   Confirmed she is taking Lasix  40/20 and Potassium 40 bid.    Follow-up plan: ICM clinic phone appointment on 10/09/2023 to recheck fluid levels.   91 day device clinic remote transmission 11/15/2023.     EP/Cardiology Office Visits:  11/14/2023 with HF Clinic.  Recall 02/18/2024 with Michaelle Adolphus, PA.     Copy of ICM check sent to Dr. Lawana Pray.    3 month ICM trend: 10/04/2023.    12-14 Month ICM trend:     Almyra Jain, RN 10/04/2023 9:43 AM

## 2023-10-04 NOTE — Progress Notes (Signed)
 Spoke with patient.  Advised Dr. Mitzie Anda recommended to take Furosemide  3 tablets (60 mg total) every morning and 2 tablets (40 mg total) every afternoon x 2 days only.   After 3rd day, take 2 tablets (40 mg total) twice a day.   Prescription updated and sent to preferred pharmacy.  Pt verbalized understanding and agreed with plan.   Advised to call back for any questions.

## 2023-10-04 NOTE — Progress Notes (Signed)
 Increase Lasix  to 60 qam/40 qpm x 3 days then continue at 40 mg bid after that.

## 2023-10-05 ENCOUNTER — Encounter: Payer: Self-pay | Admitting: Dermatology

## 2023-10-06 ENCOUNTER — Other Ambulatory Visit: Payer: Self-pay

## 2023-10-06 NOTE — Telephone Encounter (Signed)
 This is a CHF pt that is being seen by Dr. Mclean

## 2023-10-09 ENCOUNTER — Ambulatory Visit: Attending: Cardiology

## 2023-10-09 DIAGNOSIS — Z95810 Presence of automatic (implantable) cardiac defibrillator: Secondary | ICD-10-CM

## 2023-10-09 DIAGNOSIS — I5042 Chronic combined systolic (congestive) and diastolic (congestive) heart failure: Secondary | ICD-10-CM

## 2023-10-09 MED ORDER — FUROSEMIDE 20 MG PO TABS: ORAL_TABLET | ORAL | Status: DC

## 2023-10-09 NOTE — Progress Notes (Signed)
 Have her go to Lasix  60 mg qam/40 qpm alternating with 40 mg bid for now.

## 2023-10-09 NOTE — Progress Notes (Signed)
 Spoke with patient and Dr Mitzie Anda advised to take Lasix  60 mg every morning and 40 mg every evening alternating with 40 mg twice a day every other day.  She verbalized understanding and agreed to plan.   She will call back for any changes in condition.

## 2023-10-09 NOTE — Progress Notes (Signed)
 EPIC Encounter for ICM Monitoring  Patient Name: Erin [name]  ORAL HALLGREN is a 63 y.o. female Date: 10/09/2023 Primary Care Physican: [name], MD Primary Cardiologist: Mitzie Anda Electrophysiologist: Lawana Pray 08/11/2021 Office Weight: 121 lbs. 03/25/2022 Office Weight 125-127 lbs 03/13/2023 Weight: 110 lbs 03/24/2023 Weight: 115 lbs 05/26/2023 Weight: 105 lbs 07/10/2023 Weight: 103 lbs 09/15/2023 Office Weight: 110 lbs 10/04/2023 Weight: 107 lbs                                                          Spoke with patient and heart failure questions reviewed.  Transmission results reviewed.  Pt reports still has some SOB but she thinks it related to lobectomy.   Lobectomy surgery was on 4/24.   Diet:  Does not have much appetite due to chronic nausea and drinks about 64 oz fluid daily.     Optivol thoracic impedance suggesting fluid levels returning close to baseline after taking Lasix  60/40 x 2 days and then 40 mg bid.  Fluid index > normal threshold starting 4/25.   Prescribed: Furosemide  20 mg Take 2 tablets (40 mg total) by mouth twice a day Potassium 20 mEq take 2 tablets (40 mEq total) by mouth twice a day Spironolactone  25 mg take 1 tablet (12.5 mg total) by mouth at bedtime   Labs: 09/02/2023 Creatinine 1.06, BUN   9, Potassium 4.6, Sodium 139, GFR 60  09/01/2023 Creatinine 1.48, BUN 14, Potassium 4.1, Sodium 136, GFR 40  08/29/2023 Creatinine 1.29, BUN 13, Potassium 4.3, Sodium 140, GFR 47  08/15/2023 Creatinine 1.58, BUN 25, Potassium 4.6, Sodium 136  07/31/2023 Creatinine 1.48, BUN 21, Potassium 3.9, Sodium 138, GFR 40  07/18/2023 Creatinine 1.38, BUN 16, Potassium 4.2, Sodium 140  07/08/2023 Creatinine 1.7,   BUN 22, Potassium 3.8, Sodium 140, GFR 34, BNP 491 07/07/2023 Creatinine 1.38, BUN 23, Potassium 3.7, Sodium 135, GFR 44  06/18/2023 Creatinine 1.19, BUN 13, Potassium 3.5, Sodium 138, GFR 52  05/26/2023 Creatinine 1.85, BUN 16, Potassium 5.0, Sodium 133, GFR 31 A complete set of  results can be found in Results Review.   Recommendations:  Sent to Dr Mitzie Anda for review after Lasix  adjustment.   Confirmed she took recommended Lasix  dosage of 60/40 x 2 days and then 40 mg bid.    Follow-up plan: ICM clinic phone appointment on 10/16/2023 (manual) to recheck fluid levels.   91 day device clinic remote transmission 11/15/2023.     EP/Cardiology Office Visits:  11/14/2023 with HF Clinic.  Recall 02/18/2024 with Michaelle Adolphus, PA.     Copy of ICM check sent to Dr. Lawana Pray.    3 month ICM trend: 10/09/2023.    12-14 Month ICM trend:     Almyra Jain, RN 10/09/2023 1:28 PM

## 2023-10-13 NOTE — Progress Notes (Signed)
 HPI: Erin [name] is a 63 year old female with a past history of congestive heart failure, hypertension, and dyslipidemia.  She was referred to us  earlier this year for biopsy-proven adenocarcinoma of the left upper lobe.  She subsequently underwent robotic assisted left upper lobectomy by Dr. Deloise Ferries on 08/31/2023.  She had an uneventful postoperative course and was discharged to home on postop day 3.  She haws been seen in follow up by Dr. Deloise Ferries on 09/15/23 and was doing well at that time. The Path was reviewed with her at that time. A referral was made to medical oncology.  She returns today for scheduled 1 month follow-up with a chest x-ray. [name] has continued to progress.  She has minimal shortness of breath with exertion and says she recovers quickly with rest. She has some ongoing soreness with the anterior port incision.  She has not yet had follow up with her oncologist.    Current Outpatient Medications  Medication Sig Dispense Refill   aspirin  EC 81 MG tablet Take 1 tablet (81 mg total) by mouth every evening. Okay to restart on 2/29/2025 (Patient taking differently: Take 81 mg by mouth daily. Okay to restart on 2/29/2025)     atorvastatin  (LIPITOR ) 80 MG tablet Take 1 tablet by mouth once daily 90 tablet 0   clopidogrel  (PLAVIX ) 75 MG tablet Take 1 tablet (75 mg total) by mouth daily. Okay to restart on 07/04/2023 (Patient taking differently: Take 75 mg by mouth at bedtime.)     EPINEPHrine  (EPIPEN  2-PAK) 0.3 mg/0.3 mL IJ SOAJ injection Inject 0.3 mg into the muscle as needed for anaphylaxis. 2 each 2   Evolocumab  (REPATHA  SURECLICK) 140 MG/ML SOAJ Inject 140 mg into the skin every 14 (fourteen) days. 6 mL 3   ezetimibe  (ZETIA ) 10 MG tablet Take 1 tablet (10 mg total) by mouth daily. 90 tablet 3   FARXIGA  10 MG TABS tablet TAKE 1 TABLET BY MOUTH ONCE DAILY BEFORE BREAKFAST 90 tablet 0   fenofibrate  (TRICOR ) 145 MG tablet Take 1 tablet by mouth once daily 30 tablet 0    furosemide  (LASIX ) 20 MG tablet Every other day:  Take 3 tablets (60 mg total) every morning and 40 mg every evening alternating with 2 tablets (40 mg total) twice a day.     gabapentin  (NEURONTIN ) 300 MG capsule Take 1 capsule by mouth at bedtime 30 capsule 0   GAVILYTE-N  WITH FLAVOR PACK 420 g solution Take 4,000 mLs by mouth once.     hydrOXYzine  (VISTARIL ) 25 MG capsule TAKE 1 CAPSULE BY MOUTH EVERY 8 HOURS AS NEEDED 30 capsule 0   linaclotide  (LINZESS ) 145 MCG CAPS capsule Take 1 capsule (145 mcg total) by mouth daily before breakfast. (Patient taking differently: Take 145 mcg by mouth daily as needed (consipation).) 30 capsule 11   losartan  (COZAAR ) 25 MG tablet Take 0.5 tablets (12.5 mg total) by mouth at bedtime.     MAGNESIUM -OXIDE 400 (240 Mg) MG tablet Take 1 tablet by mouth daily.     metoprolol  succinate (TOPROL  XL) 25 MG 24 hr tablet Take 1 tablet (25 mg total) by mouth at bedtime. 90 tablet 3   Multiple Vitamins-Minerals (MULTIVITAMIN WITH MINERALS) tablet Take 1 tablet by mouth daily.     nicotine  (NICODERM CQ  - DOSED IN MG/24 HOURS) 21 mg/24hr patch Place 21 mg onto the skin daily.     nitroGLYCERIN  (NITROSTAT ) 0.4 MG SL tablet Place 1 tablet (0.4 mg total) under the tongue every 5 (five) minutes  x 3 doses as needed for chest pain (if no relief after 2nd dose, proceed to the ED for an evaluation or call 911). 25 tablet 2   ondansetron  (ZOFRAN ) 8 MG tablet Take 1 tablet (8 mg total) by mouth every 8 (eight) hours as needed for nausea or vomiting. (Patient taking differently: Take 8 mg by mouth daily as needed for nausea or vomiting.) 90 tablet 3   pantoprazole  (PROTONIX ) 40 MG tablet TAKE 1 TABLET BY MOUTH TWICE DAILY BEFORE A MEAL 60 tablet 0   potassium chloride  SA (KLOR-CON  M) 20 MEQ tablet Take 2 tablets (40 mEq total) by mouth 2 (two) times daily. 120 tablet 6   rOPINIRole  (REQUIP ) 0.5 MG tablet Take 0.5 mg by mouth at bedtime.   0   sertraline  (ZOLOFT ) 50 MG tablet Take 1 tablet  by mouth once daily 30 tablet 0   spironolactone  (ALDACTONE ) 25 MG tablet Take 1 tablet (25 mg total) by mouth at bedtime. 30 tablet 3   traMADol  (ULTRAM ) 50 MG tablet Take 1 tablet (50 mg total) by mouth every 6 (six) hours as needed. 40 tablet 0   traZODone  (DESYREL ) 50 MG tablet Take 1 tablet (50 mg total) by mouth at bedtime. (Patient taking differently: Take 50 mg by mouth at bedtime as needed for sleep.) 90 tablet 1   Vitamin D , Ergocalciferol , (DRISDOL) 1.25 MG (50000 UNIT) CAPS capsule Take 50,000 Units by mouth every 7 (seven) days. Mondays     No current facility-administered medications for this visit.    Physical Exam: Vital signs BP 84/50 Heart rate 72 Respirations 20 SpO2 97%  General: Alert and oriented, no distress Neck: no obvious cervical or clavicular adenopathy.  Heart: RRR Chest: normal work of breathing. The port incision are healing well with no sign of complication.  Exts: no deformities, no peripheral edema.  Neuro- grossly intact.     Diagnostic Tests: CLINICAL DATA:  Primary adenocarcinoma of left lung.   EXAM: CHEST - 2 VIEW   COMPARISON:  September 02, 2023.   FINDINGS: Stable cardiomediastinal silhouette. Left-sided defibrillator is unchanged. Status post left shoulder arthroplasty. Right lung is clear. No pneumothorax is noted. Stable left basilar scarring is noted.   IMPRESSION: No active cardiopulmonary disease.     Electronically Signed   By: Rosalene Colon M.D.   On: 09/15/2023 11:36  PATH: SURGICAL PATHOLOGY  CASE: [id number]  PATIENT: Erin  [name]  Surgical Pathology Report   Clinical History: non-small cell lung cancer (cm)    FINAL MICROSCOPIC DIAGNOSIS:   A. LYMPH NODE, LEVEL 9, EXCISION:  - Lymph node, negative for carcinoma (0/1)   B. LYMPH NODE, HILAR, EXCISION:  - Lymph node, negative for carcinoma (0/1)   C. LYMPH NODE, LEVEL 5, EXCISION:  - Lymph node, negative for carcinoma (0/1)   D. LUNG, LEFT UPPER  LOBE, LOBECTOMY:  - Invasive moderately differentiated lung adenocarcinoma, 2.2 cm, acinar  predominant  - Visceral pleura is not involved  - Resection margins are negative for carcinoma  - Five benign lymph nodes, negative for carcinoma (0/5)  - See oncology table    ONCOLOGY TABLE:   LUNG: Resection   Synchronous Tumors: Not applicable  Total Number of Primary Tumors: 1  Procedure: Lobectomy, lung  Specimen Laterality: Left  Tumor Focality: Unifocal  Tumor Site: Upper lobe  Tumor Size: 2.2 cm  Histologic Type: Adenocarcinoma, acinar predominant  Visceral Pleura Invasion: Not identified  Direct Invasion of Adjacent Structures: No adjacent structures present  Lymphovascular  Invasion: Not identified  Margins: All margins negative for invasive carcinoma       Closest Margin(s) to Invasive Carcinoma: Bronchovascular margin       Margin(s) Involved by Invasive Carcinoma: Not applicable        Margin Status for Non-Invasive Tumor: Not applicable  Treatment Effect: No known presurgical therapy  Regional Lymph Nodes:       Number of Lymph Nodes Involved: 0                            Nodal Sites with Tumor: Not applicable       Number of Lymph Nodes Examined: 8                       Nodal Sites Examined: Levels 5, 9 and 10  Distant Metastasis:       Distant Site(s) Involved: Not applicable  Pathologic Stage Classification (pTNM, AJCC 8th Edition): pT1b, pN0  Ancillary Studies: Can be performed upon request  Representative Tumor Block: A4  Comment(s): None   Impression / Plan: Progressing well after robot-assisted left upper lobectomy for clinical stage I A2 adenocarcinoma.  The CXR today was reviewed showing clear lung fields and no unexpected features.  The incisions are healing satisfactorily.  We discussed the importance of ongoing surveillance for lung cancer. Referral made to Dr. Marguerita Shih for medical oncology follow up.    Abdoul Encinas G. Danelly Hassinger, PA-C [facility] [phone]

## 2023-10-16 ENCOUNTER — Encounter

## 2023-10-16 ENCOUNTER — Other Ambulatory Visit: Payer: Self-pay | Admitting: Thoracic Surgery (Cardiothoracic Vascular Surgery)

## 2023-10-16 ENCOUNTER — Telehealth: Payer: Self-pay

## 2023-10-16 DIAGNOSIS — Z902 Acquired absence of lung [part of]: Secondary | ICD-10-CM

## 2023-10-16 NOTE — Telephone Encounter (Signed)
 Spoke with patient and requested remote transmission to recheck fluid levels.  Her electricity is out and will send a report as soon as she can.

## 2023-10-17 ENCOUNTER — Ambulatory Visit (HOSPITAL_COMMUNITY)
Admission: RE | Admit: 2023-10-17 | Discharge: 2023-10-17 | Disposition: A | Discharge status: Home / Self Care | Source: Ambulatory Visit | Attending: Cardiovascular Disease | Admitting: Cardiovascular Disease

## 2023-10-17 ENCOUNTER — Ambulatory Visit: Payer: Self-pay | Attending: Thoracic Surgery (Cardiothoracic Vascular Surgery) | Admitting: Physician Assistant

## 2023-10-17 ENCOUNTER — Encounter: Payer: Self-pay | Admitting: Physician Assistant

## 2023-10-17 VITALS — BP 84/58 | HR 72 | Resp 20 | Ht 60.0 in | Wt 108.2 lb

## 2023-10-17 DIAGNOSIS — Z96612 Presence of left artificial shoulder joint: Secondary | ICD-10-CM | POA: Diagnosis not present

## 2023-10-17 DIAGNOSIS — Z902 Acquired absence of lung [part of]: Secondary | ICD-10-CM | POA: Diagnosis not present

## 2023-10-17 DIAGNOSIS — Z95810 Presence of automatic (implantable) cardiac defibrillator: Secondary | ICD-10-CM | POA: Diagnosis not present

## 2023-10-17 DIAGNOSIS — C3412 Malignant neoplasm of upper lobe, left bronchus or lung: Secondary | ICD-10-CM

## 2023-10-18 NOTE — Progress Notes (Signed)
 No ICM remote transmission received for 10/16/2023.  Pt having difficulty sending manual remote transmission. Provided [name] number is needed per [employer]. Rescheduled for 10/23/2023.

## 2023-10-20 ENCOUNTER — Ambulatory Visit: Admitting: Internal Medicine

## 2023-10-23 ENCOUNTER — Ambulatory Visit (INDEPENDENT_AMBULATORY_CARE_PROVIDER_SITE_OTHER): Admitting: Gastroenterology

## 2023-10-23 ENCOUNTER — Ambulatory Visit: Attending: Cardiology

## 2023-10-23 ENCOUNTER — Telehealth: Payer: Self-pay | Admitting: Internal Medicine

## 2023-10-23 ENCOUNTER — Encounter: Payer: Self-pay | Admitting: Gastroenterology

## 2023-10-23 VITALS — BP 126/82 | HR 98 | Temp 98.4°F | Ht 60.0 in | Wt 106.6 lb

## 2023-10-23 DIAGNOSIS — K219 Gastro-esophageal reflux disease without esophagitis: Secondary | ICD-10-CM

## 2023-10-23 DIAGNOSIS — R112 Nausea with vomiting, unspecified: Secondary | ICD-10-CM | POA: Diagnosis not present

## 2023-10-23 DIAGNOSIS — R634 Abnormal weight loss: Secondary | ICD-10-CM | POA: Diagnosis not present

## 2023-10-23 DIAGNOSIS — I5042 Chronic combined systolic (congestive) and diastolic (congestive) heart failure: Secondary | ICD-10-CM

## 2023-10-23 DIAGNOSIS — Z91018 Allergy to other foods: Secondary | ICD-10-CM

## 2023-10-23 DIAGNOSIS — Z95810 Presence of automatic (implantable) cardiac defibrillator: Secondary | ICD-10-CM

## 2023-10-23 DIAGNOSIS — D649 Anemia, unspecified: Secondary | ICD-10-CM

## 2023-10-23 MED ORDER — PROMETHAZINE HCL 12.5 MG PO TABS: 12.5000 mg | ORAL_TABLET | Freq: Four times a day (QID) | ORAL | 0 refills | Status: DC | PRN

## 2023-10-23 MED ORDER — BUSPIRONE HCL 5 MG PO TABS: ORAL_TABLET | ORAL | 0 refills | Status: DC

## 2023-10-23 NOTE — Telephone Encounter (Signed)
 Called the patients phone and the daughter answered. She stated she would have her mother call us  back to reschedule.

## 2023-10-23 NOTE — Patient Instructions (Addendum)
 Continue zofran  as needed.   I have sent in RX for phenergan  to use sparingly when nausea and vomiting are not responding to zofran .   Continue pantoprazole  twice daily.  Start buspar for nausea/vomiting. We will gradually increase your dose to 10mg  before meals (three times daily). If you tolerate medication, let me know before you run out of current RX, your next refill will be for 10mg  pills to decrease your pill burden.  -start 5mg  twice daily before breakfast and supper for 5 days -increase to 5mg  before meals (three times daily) for 5 days -increase to 10mg  before breakfast, continue 5mg  before lunch and supper for 5 days -increase to 10mg  before breakfast and lunch, continue 5mg  before supper for 5 days -increase to 10mg  before meals (three times daily).

## 2023-10-23 NOTE — Progress Notes (Signed)
 GI Office Note    Referring Provider: [name], MD Primary Care Physician:  [name], MD  Primary Gastroenterologist: Rheba Cedar, MD   Chief Complaint   Chief Complaint  Patient presents with   Follow-up    Still having issues with nausea, states that zofran  isn't helping    History of Present Illness   Erin  AAMILAH [name] is a 63 y.o. female presenting today for follow up. Last seen in 08/2023. She has h/o weight loss, postprandial N/V, abd pain. Positive alpha gal. Reported multiple allergies based on testing. ACTH  test previously unrevealing. Fasting AM cortisol previously low with subsequent assay withing normal limits. Diagnosis of non-small cell lung cancer this year.     Today: Since her last ov, she completed left upper lobectomy by Dr. Deloise Ferries 08/31/23 for adenocarcinoma of the left upper lobe. Appointment with medical oncology is pending. She also completed colonoscopy due to abnormal PET scan of the anorectal region. Colonoscopy was unremarkable.   She continues to have flares of N/V. She developed twitch to reglan  so we had to stop it. We increased her zofran  to 8mg  TID but she is taking sometimes 4-5 times per day. Calms te nausea down for about an hour but symptoms then return. She can wake up vomiting in middle of night or in mornings. Ribs get sore from gagging. She vomits with the smell of food. Tired of eating [place], chicken, and fish due to her alpha gal. Also with dairy allergy. Avoids capsules with exception of gabapentin . Has n/v daily. Tomato soup or canned tomatoes is the only thing she tolerates consistently. Denies heartburn. BM range from normal to loose. But does not have a stool every day. Stometimes cup of coughing or water  or Mountain Dew leads to diarrhea. No melena, brbpr. No abdominal pain.   States she lost from 133 pounds down to 98 pounds at her lowest. Up to 106 pounds now.   Prior work up:  05/2021: GES normal  previously. 07/2023:Fasting am cortisol 21.5. TTG IgA <2, IgA 176.    PET 07/2023 IMPRESSION: -Ground-glass like area at the left lung apex is again seen and similar to previous CT scan. There is now fiduciary marker in this location. No specific abnormal radiotracer uptake. Please correlate with results from prior intervention. Otherwise based on today's examination, simple follow up is recommended in 3 months with CT. -Slight asymmetric area of uptake along the anorectal region. This could be a physiologic variant but please correlate with clinical findings to exclude lesion. -Incidental findings identified including bilateral adrenal adenomas. -3.1 cm infrarenal abdominal aortic aneurysm. Recommend follow-up ultrasound every 3 years. (Ref.: J Vasc Surg. 2018; 67:2-77 and J Am Coll Radiol 2013;10(10):789-794.)   CTA A/P 08/2023:   IMPRESSION: 1. No CT evidence of chronic mesenteric ischemia. 2. An infrarenal abdominal aortic aneurysm is present which measures 3.3 cm. In the interval, the aortic plaque is more ulcerated in appearance. Consideration should be given toward vascular specialist evaluation and closer clinical follow-up then what is normally considered given the higher risk appearance of this finding.  EGD 12/2022: -normal esophagus s/p dilation    Colonoscopy 08/2023: -nonbleeding internal hemorrhoids -next colonoscopy in 10 days     Medications   Current Outpatient Medications  Medication Sig Dispense Refill   aspirin  EC 81 MG tablet Take 1 tablet (81 mg total) by mouth every evening. Okay to restart on 2/29/2025     atorvastatin  (LIPITOR ) 80 MG tablet Take 1 tablet  by mouth once daily 90 tablet 0   clopidogrel  (PLAVIX ) 75 MG tablet Take 1 tablet (75 mg total) by mouth daily. Okay to restart on 07/04/2023     EPINEPHrine  (EPIPEN  2-PAK) 0.3 mg/0.3 mL IJ SOAJ injection Inject 0.3 mg into the muscle as needed for anaphylaxis. 2 each 2   Evolocumab  (REPATHA  SURECLICK)  140 MG/ML SOAJ Inject 140 mg into the skin every 14 (fourteen) days. 6 mL 3   ezetimibe  (ZETIA ) 10 MG tablet Take 1 tablet (10 mg total) by mouth daily. 90 tablet 3   FARXIGA  10 MG TABS tablet TAKE 1 TABLET BY MOUTH ONCE DAILY BEFORE BREAKFAST 90 tablet 0   fenofibrate  (TRICOR ) 145 MG tablet Take 1 tablet by mouth once daily 30 tablet 0   furosemide  (LASIX ) 20 MG tablet Every other day:  Take 3 tablets (60 mg total) every morning and 40 mg every evening alternating with 2 tablets (40 mg total) twice a day.     gabapentin  (NEURONTIN ) 300 MG capsule Take 1 capsule by mouth at bedtime 30 capsule 0   hydrOXYzine  (VISTARIL ) 25 MG capsule TAKE 1 CAPSULE BY MOUTH EVERY 8 HOURS AS NEEDED 30 capsule 0   linaclotide  (LINZESS ) 145 MCG CAPS capsule Take 1 capsule (145 mcg total) by mouth daily before breakfast. 30 capsule 11   losartan  (COZAAR ) 25 MG tablet Take 0.5 tablets (12.5 mg total) by mouth at bedtime.     MAGNESIUM -OXIDE 400 (240 Mg) MG tablet Take 1 tablet by mouth daily.     metoprolol  succinate (TOPROL  XL) 25 MG 24 hr tablet Take 1 tablet (25 mg total) by mouth at bedtime. 90 tablet 3   Multiple Vitamins-Minerals (MULTIVITAMIN WITH MINERALS) tablet Take 1 tablet by mouth daily.     nicotine  (NICODERM CQ  - DOSED IN MG/24 HOURS) 21 mg/24hr patch Place 21 mg onto the skin daily.     nitroGLYCERIN  (NITROSTAT ) 0.4 MG SL tablet Place 1 tablet (0.4 mg total) under the tongue every 5 (five) minutes x 3 doses as needed for chest pain (if no relief after 2nd dose, proceed to the ED for an evaluation or call 911). 25 tablet 2   ondansetron  (ZOFRAN ) 8 MG tablet Take 1 tablet (8 mg total) by mouth every 8 (eight) hours as needed for nausea or vomiting. 90 tablet 3   pantoprazole  (PROTONIX ) 40 MG tablet TAKE 1 TABLET BY MOUTH TWICE DAILY BEFORE A MEAL 60 tablet 0   potassium chloride  SA (KLOR-CON  M) 20 MEQ tablet Take 2 tablets (40 mEq total) by mouth 2 (two) times daily. 120 tablet 6   rOPINIRole  (REQUIP ) 0.5 MG  tablet Take 0.5 mg by mouth at bedtime.   0   sertraline  (ZOLOFT ) 50 MG tablet Take 1 tablet by mouth once daily 30 tablet 0   spironolactone  (ALDACTONE ) 25 MG tablet Take 1 tablet (25 mg total) by mouth at bedtime. 30 tablet 3   traMADol  (ULTRAM ) 50 MG tablet Take 1 tablet (50 mg total) by mouth every 6 (six) hours as needed. 40 tablet 0   traZODone  (DESYREL ) 50 MG tablet Take 1 tablet (50 mg total) by mouth at bedtime. 90 tablet 1   Vitamin D , Ergocalciferol , (DRISDOL) 1.25 MG (50000 UNIT) CAPS capsule Take 50,000 Units by mouth every 7 (seven) days. Mondays     No current facility-administered medications for this visit.    Allergies   Allergies as of 10/23/2023 - Review Complete 10/23/2023  Allergen Reaction Noted   Alpha-gal Shortness Of Breath,  Nausea And Vomiting, and Dermatitis 02/28/2022   Other Shortness Of Breath, Diarrhea, Nausea And Vomiting, and Nausea Only 03/02/2021   Beef-derived drug products Diarrhea and Nausea And Vomiting 08/31/2023   Chantix  [varenicline ] Nausea Only 08/15/2023   Pork-derived products Diarrhea and Nausea And Vomiting 08/31/2023   Reglan  [metoclopramide ]  08/07/2023   Tape Other (See Comments) 01/03/2017      Review of Systems   General: Negative for  , fever, chills, fatigue, weakness.see hpi ENT: Negative for hoarseness, difficulty swallowing , nasal congestion. CV: Negative for chest pain, angina, palpitations, dyspnea on exertion, peripheral edema.  Respiratory: Negative for dyspnea at rest, dyspnea on exertion, cough, sputum, wheezing.  GI: See history of present illness. GU:  Negative for dysuria, hematuria, urinary incontinence, urinary frequency, nocturnal urination.  Endo: Negative for unusual weight change.     Physical Exam   BP 126/82 (BP Location: Right Arm, Patient Position: Sitting, Cuff Size: Normal)   Pulse 98   Temp 98.4 F (36.9 C) (Oral)   Ht 5' (1.524 m)   Wt 106 lb 9.6 oz (48.4 kg)   SpO2 96%   BMI 20.82 kg/m     General: Well-nourished, well-developed in no acute distress.  Eyes: No icterus. Mouth: Oropharyngeal mucosa moist and pink   Lungs: Clear to auscultation bilaterally.  Heart: Regular rate and rhythm, no murmurs rubs or gallops.  Abdomen: Bowel sounds are normal, nontender, nondistended, no hepatosplenomegaly or masses,  no abdominal bruits or hernia , no rebound or guarding.  Rectal: not performed Extremities: No lower extremity edema. No clubbing or deformities. Neuro: Alert and oriented x 4   Skin: Warm and dry, no jaundice.   Psych: Alert and cooperative, normal mood and affect.  Labs   Lab Results  Component Value Date   NA 139 09/02/2023   CL 106 09/02/2023   K 4.6 09/02/2023   CO2 27 09/02/2023   BUN 9 09/02/2023   CREATININE 1.06 (H) 09/02/2023   GFRNONAA 60 (L) 09/02/2023   CALCIUM  8.8 (L) 09/02/2023   ALBUMIN  2.8 (L) 09/02/2023   GLUCOSE 102 (H) 09/02/2023   Lab Results  Component Value Date   ALT 9 09/02/2023   AST 24 09/02/2023   ALKPHOS 24 (L) 09/02/2023   BILITOT 0.6 09/02/2023   Lab Results  Component Value Date   WBC 9.9 09/02/2023   HGB 8.4 (L) 09/02/2023   HCT 25.4 (L) 09/02/2023   MCV 80.9 09/02/2023   PLT 292 09/02/2023   Lab Results  Component Value Date   INR 0.9 08/29/2023   INR 1.0 08/15/2023   INR 0.9 09/29/2022      Imaging Studies   DG Chest 2 View Result Date: 10/17/2023 CLINICAL DATA:  Lobectomy. EXAM: CHEST - 2 VIEW COMPARISON:  Chest CT dated 09/15/2023. FINDINGS: Stable left basilar scarring. The right lung is clear. No pleural effusion pneumothorax. The cardiac silhouette is within limits. Left pectoral AICD device. No acute osseous pathology. Left shoulder arthroplasty. IMPRESSION: No active cardiopulmonary disease. Electronically Signed   By: Angus Bark M.D.   On: 10/17/2023 10:55    Assessment/Plan:   N/V/weight loss: -chronic recurrent symptoms with extensive work up as previously outlined. Positive for Alpha Gal  but practices food avoidance. GES normal. EGD completed. S/p cholecystectomy. Fasting am cortisol level normal. 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 More recent MRI brain without acute changes. CTA A/P without mesenteric ischemia -reglan  most helpful but developed twitch, therefore we advised against using it -  continue zofran  to 8mg  tid.  -rx for phenergan  12.5mg  to have on hand for severe nausea/vomiting -continue pantoprazole  BID -trial of buspar, titrate up to 10mg  TID before meals to help with gastric accomodation, nausea -reach out with progress report in few weeks   Trudie Fuse. Harles Lied, MHS, PA-C [facility]

## 2023-10-25 ENCOUNTER — Telehealth: Payer: Self-pay

## 2023-10-25 NOTE — Telephone Encounter (Signed)
Remote ICM transmission received.  Attempted call to patient regarding ICM remote transmission and mail box is full. 

## 2023-10-25 NOTE — Progress Notes (Signed)
 EPIC Encounter for ICM Monitoring  Patient Name: Erin [name]  Erin [name] is a 63 y.o. female Date: 10/25/2023 Primary Care Physican: [name], MD Primary Cardiologist: Mitzie Anda Electrophysiologist: Lawana Pray 08/11/2021 Office Weight: 121 lbs. 03/25/2022 Office Weight 125-127 lbs 03/13/2023 Weight: 110 lbs 03/24/2023 Weight: 115 lbs 05/26/2023 Weight: 105 lbs 07/10/2023 Weight: 103 lbs 09/15/2023 Office Weight: 110 lbs 10/04/2023 Weight: 107 lbs                                                          Attempted call to patient and unable to reach.  Transmission results reviewed.    Diet:  Does not have much appetite due to chronic nausea and drinks about 64 oz fluid daily.     Optivol thoracic impedance suggesting fluid levels returned to normal 6/11 after Lasix  dosage changed to 60AM/40PM alternating with 40mg  bid.  Fluid index returned to normal threshold 6/13   Prescribed: Furosemide  20 mg Take 3 tablets (60 mg total) by mouth every morning and 40 mg every evening alternating with 40 mg twice a day. Potassium 20 mEq take 2 tablets (40 mEq total) by mouth twice a day Spironolactone  25 mg take 1 tablet (12.5 mg total) by mouth at bedtime   Labs: 09/02/2023 Creatinine 1.06, BUN   9, Potassium 4.6, Sodium 139, GFR 60  09/01/2023 Creatinine 1.48, BUN 14, Potassium 4.1, Sodium 136, GFR 40  08/29/2023 Creatinine 1.29, BUN 13, Potassium 4.3, Sodium 140, GFR 47  08/15/2023 Creatinine 1.58, BUN 25, Potassium 4.6, Sodium 136  07/31/2023 Creatinine 1.48, BUN 21, Potassium 3.9, Sodium 138, GFR 40  07/18/2023 Creatinine 1.38, BUN 16, Potassium 4.2, Sodium 140  07/08/2023 Creatinine 1.7,   BUN 22, Potassium 3.8, Sodium 140, GFR 34, BNP 491 07/07/2023 Creatinine 1.38, BUN 23, Potassium 3.7, Sodium 135, GFR 44  06/18/2023 Creatinine 1.19, BUN 13, Potassium 3.5, Sodium 138, GFR 52  05/26/2023 Creatinine 1.85, BUN 16, Potassium 5.0, Sodium 133, GFR 31 A complete set of results can be found in Results  Review.   Recommendations:  Unable to reach.     Follow-up plan: ICM clinic phone appointment on 11/20/2023.   91 day device clinic remote transmission 11/15/2023.     EP/Cardiology Office Visits:  11/14/2023 with [facility].  Recall 02/18/2024 with Michaelle Adolphus, PA.     Copy of ICM check sent to Dr. Lawana Pray.    3 month ICM trend: 10/22/2023.    12-14 Month ICM trend:     Almyra Jain, RN 10/25/2023 12:34 PM

## 2023-11-01 ENCOUNTER — Other Ambulatory Visit: Payer: Self-pay | Admitting: Internal Medicine

## 2023-11-01 DIAGNOSIS — D649 Anemia, unspecified: Secondary | ICD-10-CM

## 2023-11-02 ENCOUNTER — Inpatient Hospital Stay: Attending: Internal Medicine

## 2023-11-02 ENCOUNTER — Inpatient Hospital Stay (HOSPITAL_BASED_OUTPATIENT_CLINIC_OR_DEPARTMENT_OTHER): Admitting: Internal Medicine

## 2023-11-02 VITALS — BP 82/68 | HR 79 | Resp 16 | Ht 60.0 in | Wt 107.1 lb

## 2023-11-02 DIAGNOSIS — Z902 Acquired absence of lung [part of]: Secondary | ICD-10-CM | POA: Diagnosis not present

## 2023-11-02 DIAGNOSIS — C3412 Malignant neoplasm of upper lobe, left bronchus or lung: Secondary | ICD-10-CM | POA: Diagnosis not present

## 2023-11-02 DIAGNOSIS — I959 Hypotension, unspecified: Secondary | ICD-10-CM | POA: Insufficient documentation

## 2023-11-02 DIAGNOSIS — Z85828 Personal history of other malignant neoplasm of skin: Secondary | ICD-10-CM | POA: Diagnosis not present

## 2023-11-02 DIAGNOSIS — D649 Anemia, unspecified: Secondary | ICD-10-CM | POA: Insufficient documentation

## 2023-11-02 DIAGNOSIS — C3490 Malignant neoplasm of unspecified part of unspecified bronchus or lung: Secondary | ICD-10-CM | POA: Diagnosis not present

## 2023-11-02 DIAGNOSIS — I110 Hypertensive heart disease with heart failure: Secondary | ICD-10-CM | POA: Insufficient documentation

## 2023-11-02 LAB — IRON AND IRON BINDING CAPACITY (CC-WL,HP ONLY)
Iron: 70 ug/dL (ref 28–170)
Saturation Ratios: 14 % (ref 10.4–31.8)
TIBC: 517 ug/dL — ABNORMAL HIGH (ref 250–450)
UIBC: 447 ug/dL — ABNORMAL HIGH (ref 148–442)

## 2023-11-02 LAB — CBC WITH DIFFERENTIAL (CANCER CENTER ONLY)
Abs Immature Granulocytes: 0.08 10*3/uL — ABNORMAL HIGH (ref 0.00–0.07)
Basophils Absolute: 0.1 10*3/uL (ref 0.0–0.1)
Basophils Relative: 1 %
Eosinophils Absolute: 0.2 10*3/uL (ref 0.0–0.5)
Eosinophils Relative: 3 %
HCT: 33.6 % — ABNORMAL LOW (ref 36.0–46.0)
Hemoglobin: 11.1 g/dL — ABNORMAL LOW (ref 12.0–15.0)
Immature Granulocytes: 1 %
Lymphocytes Relative: 30 %
Lymphs Abs: 2.2 10*3/uL (ref 0.7–4.0)
MCH: 26.6 pg (ref 26.0–34.0)
MCHC: 33 g/dL (ref 30.0–36.0)
MCV: 80.6 fL (ref 80.0–100.0)
Monocytes Absolute: 0.9 10*3/uL (ref 0.1–1.0)
Monocytes Relative: 12 %
Neutro Abs: 4.1 10*3/uL (ref 1.7–7.7)
Neutrophils Relative %: 53 %
Platelet Count: 417 10*3/uL — ABNORMAL HIGH (ref 150–400)
RBC: 4.17 MIL/uL (ref 3.87–5.11)
RDW: 15.1 % (ref 11.5–15.5)
WBC Count: 7.5 10*3/uL (ref 4.0–10.5)
nRBC: 0 % (ref 0.0–0.2)

## 2023-11-02 LAB — CMP (CANCER CENTER ONLY)
ALT: 8 U/L (ref 0–44)
AST: 16 U/L (ref 15–41)
Albumin: 4.2 g/dL (ref 3.5–5.0)
Alkaline Phosphatase: 43 U/L (ref 38–126)
Anion gap: 7 (ref 5–15)
BUN: 18 mg/dL (ref 8–23)
CO2: 28 mmol/L (ref 22–32)
Calcium: 9.3 mg/dL (ref 8.9–10.3)
Chloride: 106 mmol/L (ref 98–111)
Creatinine: 1.54 mg/dL — ABNORMAL HIGH (ref 0.44–1.00)
GFR, Estimated: 38 mL/min — ABNORMAL LOW (ref >60)
Glucose, Bld: 83 mg/dL (ref 70–99)
Potassium: 3.8 mmol/L (ref 3.5–5.1)
Sodium: 141 mmol/L (ref 135–145)
Total Bilirubin: 0.3 mg/dL (ref 0.0–1.2)
Total Protein: 6.7 g/dL (ref 6.5–8.1)

## 2023-11-02 LAB — VITAMIN B12: Vitamin B-12: 196 pg/mL (ref 180–914)

## 2023-11-02 LAB — FERRITIN: Ferritin: 98 ng/mL (ref 11–307)

## 2023-11-02 LAB — FOLATE: Folate: 13.7 ng/mL (ref >5.9)

## 2023-11-02 NOTE — Progress Notes (Signed)
 [facility] Telephone:([phone]   [phone]) [phone]  OFFICE PROGRESS NOTE  No primary care provider on file. No primary provider on file.  DIAGNOSIS: Stage 1A (T1b, N0, M0) adenocarcinoma of the lung diagnosed in February 2025.   PRIOR THERAPY: Status post left upper lobectomy with lymph node sampling under the care of Dr. Shyrl on 08/31/2023  CURRENT THERAPY: Observation  INTERVAL HISTORY: Erin [name] 63 y.o. female returns to the clinic today for follow-up visit.Discussed the use of AI scribe software for clinical note transcription with the patient, who gave verbal consent to proceed.  History of Present Illness   Erin  Erin [name] is a 63 year old female with stage 1A non-small cell lung cancer who presents for evaluation post left upper lobectomy.  She was diagnosed with stage 1A non-small cell lung cancer, adenocarcinoma, in February 2025 and underwent a left upper lobectomy with lymph node sampling on August 31, 2023. Post-surgery, she experienced difficulty breathing and low oxygen  levels during her first week at home, requiring the use of her granddaughter's oxygen  concentrator for about a week. She continues to experience persistent soreness from the surgery and pain radiating to her upper abdomen.  She reports low blood pressure, measured at 82/68 mmHg, and experiences dizziness. She takes metoprolol  25 mg at night and losartan  for blood pressure management. She confirms adequate fluid intake, having consumed orange juice and water  with breakfast.  Her past medical history includes anemia, with hemoglobin levels improving from 8.4 g/dL in April to 88.8 g/dL currently. She is not on iron supplements but has increased her vegetable intake. She also has congestive heart failure and manages her fluid intake, currently drinking 36-38 ounces daily.       MEDICAL HISTORY: Past Medical History:  Diagnosis Date   AICD (automatic  cardioverter/defibrillator) present    Allergic reaction to alpha-gal    Allergy 03/05/2022   Anemia    Anxiety state 03/25/2016   Arthritis    Basal cell carcinoma of forehead    Brain aneurysm    CHF (congestive heart failure) (HCC)    Coronary artery disease    a. 03/11/16 PCI with DES-->Prox/Mid Cx;  b. 03/14/16 PCI with DES x2-->RCA, EF 30-35%.   Depression 06/09/2010   Encounter for general adult medical examination with abnormal findings 05/04/2022   Essential hypertension    Hx   GERD (gastroesophageal reflux disease)    HFrEF (heart failure with reduced ejection fraction) (HCC)    a. 10/2016 Echo: EF 35-40%, Gr1 DD, mild focal basal septal hypertrophy, basal inflat, mid inflat, basal antlat AK. Mid infept/inf/antlat, apical lateral sev HK. Mod MR. mildly reduced RV fxn. Mild TR.   History of pneumonia    Hyperlipidemia    IBS (irritable bowel syndrome)    Ischemic cardiomyopathy    a. 10/2016 Echo: EF 35-40%, Gr1 DD.   Lung cancer [facility])    Mitral regurgitation    Neuromuscular disorder (HCC)    NSTEMI (non-ST elevated myocardial infarction) (HCC) 03/10/2016   Pneumonia 03/2016   Shingles    Squamous cell cancer of skin of nose    Thrombocytosis 03/26/2016   Tobacco abuse    Trichimoniasis    Wears dentures    Wears glasses     ALLERGIES:  is allergic to alpha-gal, other, beef-derived drug products, chantix  [varenicline ], pork-derived products, reglan  [metoclopramide ], and tape.  MEDICATIONS:  Current Outpatient Medications  Medication Sig Dispense Refill   aspirin  EC 81 MG tablet  Take 1 tablet (81 mg total) by mouth every evening. Okay to restart on 2/29/2025     atorvastatin  (LIPITOR ) 80 MG tablet Take 1 tablet by mouth once daily 90 tablet 0   busPIRone (BUSPAR) 5 MG tablet Start 5mg  twice daily before breakfast and supper for 5 days, then increase to 5mg  before meals (three times daily) for 5 days, then increase to 10mg  before breakfast and 5mg  before lunch and  supper for 5 days, then increase to 10mg  before breakfast and lunch and 5mg  before supper for 5 days, then increase to 10mg  before meals (3 times daily). With next refill we will give you 10mg  tablets. 130 tablet 0   clopidogrel  (PLAVIX ) 75 MG tablet Take 1 tablet (75 mg total) by mouth daily. Okay to restart on 07/04/2023     EPINEPHrine  (EPIPEN  2-PAK) 0.3 mg/0.3 mL IJ SOAJ injection Inject 0.3 mg into the muscle as needed for anaphylaxis. 2 each 2   Evolocumab  (REPATHA  SURECLICK) 140 MG/ML SOAJ Inject 140 mg into the skin every 14 (fourteen) days. 6 mL 3   ezetimibe  (ZETIA ) 10 MG tablet Take 1 tablet (10 mg total) by mouth daily. 90 tablet 3   FARXIGA  10 MG TABS tablet TAKE 1 TABLET BY MOUTH ONCE DAILY BEFORE BREAKFAST 90 tablet 0   fenofibrate  (TRICOR ) 145 MG tablet Take 1 tablet by mouth once daily 30 tablet 0   furosemide  (LASIX ) 20 MG tablet Every other day:  Take 3 tablets (60 mg total) every morning and 40 mg every evening alternating with 2 tablets (40 mg total) twice a day.     gabapentin  (NEURONTIN ) 300 MG capsule Take 1 capsule by mouth at bedtime 30 capsule 0   hydrOXYzine  (VISTARIL ) 25 MG capsule TAKE 1 CAPSULE BY MOUTH EVERY 8 HOURS AS NEEDED 30 capsule 0   linaclotide  (LINZESS ) 145 MCG CAPS capsule Take 1 capsule (145 mcg total) by mouth daily before breakfast. 30 capsule 11   losartan  (COZAAR ) 25 MG tablet Take 0.5 tablets (12.5 mg total) by mouth at bedtime.     MAGNESIUM -OXIDE 400 (240 Mg) MG tablet Take 1 tablet by mouth daily.     metoprolol  succinate (TOPROL  XL) 25 MG 24 hr tablet Take 1 tablet (25 mg total) by mouth at bedtime. 90 tablet 3   Multiple Vitamins-Minerals (MULTIVITAMIN WITH MINERALS) tablet Take 1 tablet by mouth daily.     nicotine  (NICODERM CQ  - DOSED IN MG/24 HOURS) 21 mg/24hr patch Place 21 mg onto the skin daily.     nitroGLYCERIN  (NITROSTAT ) 0.4 MG SL tablet Place 1 tablet (0.4 mg total) under the tongue every 5 (five) minutes x 3 doses as needed for chest  pain (if no relief after 2nd dose, proceed to the ED for an evaluation or call 911). 25 tablet 2   ondansetron  (ZOFRAN ) 8 MG tablet Take 1 tablet (8 mg total) by mouth every 8 (eight) hours as needed for nausea or vomiting. 90 tablet 3   pantoprazole  (PROTONIX ) 40 MG tablet TAKE 1 TABLET BY MOUTH TWICE DAILY BEFORE A MEAL 60 tablet 0   potassium chloride  SA (KLOR-CON  M) 20 MEQ tablet Take 2 tablets (40 mEq total) by mouth 2 (two) times daily. 120 tablet 6   promethazine  (PHENERGAN ) 12.5 MG tablet Take 1 tablet (12.5 mg total) by mouth every 6 (six) hours as needed for nausea or vomiting. 20 tablet 0   rOPINIRole  (REQUIP ) 0.5 MG tablet Take 0.5 mg by mouth at bedtime.   0  sertraline  (ZOLOFT ) 50 MG tablet Take 1 tablet by mouth once daily 30 tablet 0   spironolactone  (ALDACTONE ) 25 MG tablet Take 1 tablet (25 mg total) by mouth at bedtime. 30 tablet 3   traMADol  (ULTRAM ) 50 MG tablet Take 1 tablet (50 mg total) by mouth every 6 (six) hours as needed. 40 tablet 0   traZODone  (DESYREL ) 50 MG tablet Take 1 tablet (50 mg total) by mouth at bedtime. 90 tablet 1   Vitamin D , Ergocalciferol , (DRISDOL) 1.25 MG (50000 UNIT) CAPS capsule Take 50,000 Units by mouth every 7 (seven) days. Mondays     No current facility-administered medications for this visit.    SURGICAL HISTORY:  Past Surgical History:  Procedure Laterality Date   APPENDECTOMY     BICEPT TENODESIS Left 03/21/2023   Procedure: BICEPS TENODESIS;  Surgeon: Addie Cordella Hamilton, MD;  Location: [facility] OR;  Service: Orthopedics;  Laterality: Left;   BIOPSY  09/20/2018   Procedure: BIOPSY;  Surgeon: Shaaron Lamar HERO, MD;  [facility];  Service: Endoscopy;;  colon   BIOPSY  01/05/2021   Procedure: BIOPSY;  Surgeon: Teressa Toribio SQUIBB, MD;  Location: [facility] ENDOSCOPY;  Service: Endoscopy;;   BIOPSY  04/27/2022   Procedure: BIOPSY;  Surgeon: Cindie Carlin POUR, DO;  [facility];  Service: Endoscopy;;   BRONCHIAL BIOPSY  07/03/2023    Procedure: BRONCHIAL BIOPSIES;  Surgeon: Shelah Lamar RAMAN, MD;  Location: [facility] ENDOSCOPY;  Service: Pulmonary;;   BRONCHIAL BRUSHINGS  07/03/2023   Procedure: BRONCHIAL BRUSHINGS;  Surgeon: Shelah Lamar RAMAN, MD;  Location: [facility] ENDOSCOPY;  Service: Pulmonary;;   BRONCHIAL NEEDLE ASPIRATION BIOPSY  07/03/2023   Procedure: BRONCHIAL NEEDLE ASPIRATION BIOPSIES;  Surgeon: Shelah Lamar RAMAN, MD;  Location: [facility] ENDOSCOPY;  Service: Pulmonary;;   BRONCHIAL WASHINGS  07/03/2023   Procedure: BRONCHIAL WASHINGS;  Surgeon: Shelah Lamar RAMAN, MD;  Location: [facility] ENDOSCOPY;  Service: Pulmonary;;   CARDIAC CATHETERIZATION N/A 03/11/2016   Procedure: Left Heart Cath and Coronary Angiography;  Surgeon: Alm LELON Clay, MD;  Location: [facility] INVASIVE CV LAB;  Service: Cardiovascular;  Laterality: N/A;   CARDIAC CATHETERIZATION N/A 03/11/2016   Procedure: Coronary Stent Intervention;  Surgeon: Alm LELON Clay, MD;  Location: [facility] INVASIVE CV LAB;  Service: Cardiovascular;  Laterality: N/A;   CARDIAC CATHETERIZATION N/A 03/14/2016   Procedure: Coronary Stent Intervention;  Surgeon: Peter M Swaziland, MD;  Location: [facility] INVASIVE CV LAB;  Service: Cardiovascular;  Laterality: N/A;   CEREBRAL ANEURYSM REPAIR  04/2019   stent placed   CHOLECYSTECTOMY OPEN  1984   COLONOSCOPY N/A 08/28/2023   Procedure: COLONOSCOPY;  Surgeon: Cindie Carlin POUR, DO;  [facility];  Service: Endoscopy;  Laterality: N/A;  1030am, asa 3, has defibrillator   COLONOSCOPY WITH PROPOFOL  N/A 09/20/2018   Procedure: COLONOSCOPY WITH PROPOFOL ;  Surgeon: Shaaron Lamar HERO, MD;  [facility];  Service: Endoscopy;  Laterality: N/A;  10:30am   CORONARY ANGIOPLASTY WITH STENT PLACEMENT  03/14/2016   ESOPHAGOGASTRODUODENOSCOPY (EGD) WITH PROPOFOL  N/A 09/20/2018   Procedure: ESOPHAGOGASTRODUODENOSCOPY (EGD) WITH PROPOFOL ;  Surgeon: Shaaron Lamar HERO, MD;  [facility];  Service: Endoscopy;  Laterality: N/A;   ESOPHAGOGASTRODUODENOSCOPY (EGD)  WITH PROPOFOL  N/A 01/05/2021   Procedure: ESOPHAGOGASTRODUODENOSCOPY (EGD) WITH PROPOFOL ;  Surgeon: Teressa Toribio SQUIBB, MD;  Location: [facility] ENDOSCOPY;  Service: Endoscopy;  Laterality: N/A;   ESOPHAGOGASTRODUODENOSCOPY (EGD) WITH PROPOFOL  N/A 04/27/2022   Procedure: ESOPHAGOGASTRODUODENOSCOPY (EGD) WITH PROPOFOL ;  Surgeon: Cindie Carlin POUR, DO;  [facility];  Service: Endoscopy;  Laterality: N/A;  9:15am, asa 3/4, ASAP   ESOPHAGOGASTRODUODENOSCOPY (EGD) WITH PROPOFOL  N/A 01/04/2023   Procedure: ESOPHAGOGASTRODUODENOSCOPY (EGD) WITH PROPOFOL ;  Surgeon: Shaaron Lamar HERO, MD;  [facility];  Service: Endoscopy;  Laterality: N/A;  130pm, asa 3   FIDUCIAL MARKER PLACEMENT  07/03/2023   Procedure: FIDUCIAL MARKER PLACEMENT;  Surgeon: Shelah Lamar RAMAN, MD;  Location: [facility] ENDOSCOPY;  Service: Pulmonary;;   FINGER ARTHROPLASTY Left 05/14/2013   Procedure: LEFT THUMB CARPAL METACARPAL ARTHROPLASTY;  Surgeon: Franky JONELLE Curia, MD;  Location: [facility];  Service: Orthopedics;  Laterality: Left;   ICD IMPLANT N/A 04/03/2020   Procedure: ICD IMPLANT;  Surgeon: Inocencio Soyla Lunger, MD;  Location: [facility] INVASIVE CV LAB;  Service: Cardiovascular;  Laterality: N/A;   INTERCOSTAL NERVE BLOCK Left 08/31/2023   Procedure: BLOCK, NERVE, INTERCOSTAL;  Surgeon: Shyrl Linnie KIDD, MD;  Location: [facility] OR;  Service: Thoracic;  Laterality: Left;   IR ANGIO INTRA EXTRACRAN SEL COM CAROTID INNOMINATE BILAT MOD SED  01/05/2017   IR ANGIO INTRA EXTRACRAN SEL COM CAROTID INNOMINATE BILAT MOD SED  03/19/2019   IR ANGIO INTRA EXTRACRAN SEL COM CAROTID INNOMINATE BILAT MOD SED  06/04/2020   IR ANGIO INTRA EXTRACRAN SEL INTERNAL CAROTID UNI L MOD SED  04/15/2019   IR ANGIO VERTEBRAL SEL VERTEBRAL BILAT MOD SED  01/05/2017   IR ANGIO VERTEBRAL SEL VERTEBRAL BILAT MOD SED  03/19/2019   IR ANGIO VERTEBRAL SEL VERTEBRAL UNI L MOD SED  06/04/2020   IR ANGIOGRAM FOLLOW UP STUDY  04/15/2019   IR RADIOLOGIST  EVAL & MGMT  12/30/2016   IR TRANSCATH/EMBOLIZ  04/15/2019   IR US  GUIDE VASC ACCESS RIGHT  03/19/2019   IR US  GUIDE VASC ACCESS RIGHT  06/04/2020   JOINT REPLACEMENT     LOBECTOMY, LUNG, ROBOT-ASSISTED, USING VATS Left 08/31/2023   Procedure: LOBECTOMY, LEFT UPPER LUNG, ROBOT-ASSISTED, USING VATS;  Surgeon: Shyrl Linnie KIDD, MD;  Location: [facility] OR;  Service: Thoracic;  Laterality: Left;  Left Robotic Assisted Thoracoscopy for Left Upper Lobectomy   LYMPH NODE BIOPSY Left 08/31/2023   Procedure: LYMPH NODE BIOPSY;  Surgeon: Shyrl Linnie KIDD, MD;  Location: [facility] OR;  Service: Thoracic;  Laterality: Left;   MALONEY DILATION N/A 09/20/2018   Procedure: AGAPITO HODGKIN;  Surgeon: Shaaron Lamar HERO, MD;  [facility];  Service: Endoscopy;  Laterality: N/A;   MALONEY DILATION N/A 01/04/2023   Procedure: AGAPITO DILATION;  Surgeon: Shaaron Lamar HERO, MD;  [facility];  Service: Endoscopy;  Laterality: N/A;   RADIOLOGY WITH ANESTHESIA N/A 04/15/2019   Procedure: BARBARANN;  Surgeon: Dolphus Carrion, MD;  Location: [facility] OR;  Service: Radiology;  Laterality: N/A;   REVERSE SHOULDER ARTHROPLASTY Left 03/21/2023   Procedure: LEFT REVERSE SHOULDER ARTHROPLASTY;  Surgeon: Addie Cordella Hamilton, MD;  Location: [facility] OR;  Service: Orthopedics;  Laterality: Left;   RIGHT/LEFT HEART CATH AND CORONARY ANGIOGRAPHY N/A 08/19/2019   Procedure: RIGHT/LEFT HEART CATH AND CORONARY ANGIOGRAPHY;  Surgeon: Rolan Ezra RAMAN, MD;  Location: [facility] INVASIVE CV LAB;  Service: Cardiovascular;  Laterality: N/A;   TUBAL LIGATION  1987   VAGINAL HYSTERECTOMY  2009    REVIEW OF SYSTEMS:  Constitutional: positive for fatigue Eyes: negative Ears, nose, mouth, throat, and face: negative Respiratory: positive for pleurisy/chest pain Cardiovascular: positive for hypotension Gastrointestinal: negative Genitourinary:negative Integument/breast: negative Hematologic/lymphatic:  negative Musculoskeletal:negative Neurological: negative Behavioral/Psych: negative Endocrine: negative Allergic/Immunologic: negative   PHYSICAL EXAMINATION: General appearance: alert, cooperative,  fatigued, and no distress Head: Normocephalic, without obvious abnormality, atraumatic Neck: no adenopathy, no JVD, supple, symmetrical, trachea midline, and thyroid  not enlarged, symmetric, no tenderness/mass/nodules Lymph nodes: Cervical, supraclavicular, and axillary nodes normal. Resp: clear to auscultation bilaterally Back: symmetric, no curvature. ROM normal. No CVA tenderness. Cardio: regular rate and rhythm, S1, S2 normal, no murmur, click, rub or gallop GI: soft, non-tender; bowel sounds normal; no masses,  no organomegaly Extremities: extremities normal, atraumatic, no cyanosis or edema Neurologic: Alert and oriented X 3, normal strength and tone. Normal symmetric reflexes. Normal coordination and gait  ECOG PERFORMANCE STATUS: 1 - Symptomatic but completely ambulatory  Blood pressure (!) 82/68, pulse 79, resp. rate 16, height 5' (1.524 m), weight 107 lb 1.6 oz (48.6 kg), SpO2 100%.  LABORATORY DATA: Lab Results  Component Value Date   WBC 7.5 11/02/2023   HGB 11.1 (L) 11/02/2023   HCT 33.6 (L) 11/02/2023   MCV 80.6 11/02/2023   PLT 417 (H) 11/02/2023      Chemistry      Component Value Date/Time   NA 141 11/02/2023 0911   NA 134 06/30/2022 0815   K 3.8 11/02/2023 0911   CL 106 11/02/2023 0911   CO2 28 11/02/2023 0911   BUN 18 11/02/2023 0911   BUN 25 06/30/2022 0815   CREATININE 1.54 (H) 11/02/2023 0911   CREATININE 0.73 05/13/2016 1020      Component Value Date/Time   CALCIUM  9.3 11/02/2023 0911   ALKPHOS 43 11/02/2023 0911   AST 16 11/02/2023 0911   ALT 8 11/02/2023 0911   BILITOT 0.3 11/02/2023 0911       RADIOGRAPHIC STUDIES: DG Chest 2 View Result Date: 10/17/2023 CLINICAL DATA:  Lobectomy. EXAM: CHEST - 2 VIEW COMPARISON:  Chest CT dated  09/15/2023. FINDINGS: Stable left basilar scarring. The right lung is clear. No pleural effusion pneumothorax. The cardiac silhouette is within limits. Left pectoral AICD device. No acute osseous pathology. Left shoulder arthroplasty. IMPRESSION: No active cardiopulmonary disease. Electronically Signed   By: Vanetta Chou M.D.   On: 10/17/2023 10:55    ASSESSMENT AND PLAN: This is a very pleasant 63 years old white female with Stage 1A (T1b, N0, M0) adenocarcinoma of the lung diagnosed in February 2025.  She is status post left upper lobectomy with lymph node sampling under the care of Dr. Shyrl on 08/31/2023 Assessment and Plan    Stage 1A non-small cell lung cancer, post-surgery Status post left upper lobectomy with lymph node sampling on August 31, 2023. Currently on observation. Oxygen  saturation is 100% on room air. Reports soreness and previous low oxygen  levels post-surgery, which have resolved. No current respiratory issues. - Schedule follow-up scan in four months to assess post-surgical status.  Congestive heart failure Managing fluid intake due to CHF. Currently drinking 36-38 ounces of fluid daily, advised by cardiologist's nurse to increase to 64 ounces. Balancing fluid intake is challenging. - Continue to manage fluid intake as advised by cardiologist's team.  Hypotension Blood pressure recorded at 82/68. She is on metoprolol  and losartan  for blood pressure management. Reports dizziness. - Advise to contact Dr. Rolan to discuss potential adjustment of blood pressure medications.  Anemia Previous anemia with hemoglobin at 8.4 in April, now improved to 11.1. Not on iron supplements, but dietary intake includes more vegetables. Iron levels are currently adequate. - Monitor hemoglobin and iron levels. - Advise to maintain current dietary habits.   She was advised to call immediately if she has any concerning symptoms in  the interval. The patient voices understanding of current  disease status and treatment options and is in agreement with the current care plan.  All questions were answered. The patient knows to call the clinic with any problems, questions or concerns. We can certainly see the patient much sooner if necessary. The total time spent in the appointment was 30 minutes including review of chart and various tests results, discussions about plan of care and coordination of care plan .   Disclaimer: This note was dictated with voice recognition software. Similar sounding words can inadvertently be transcribed and may not be corrected upon review.

## 2023-11-03 ENCOUNTER — Encounter (HOSPITAL_COMMUNITY): Payer: Self-pay | Admitting: Interventional Radiology

## 2023-11-03 ENCOUNTER — Other Ambulatory Visit: Payer: Self-pay | Admitting: Internal Medicine

## 2023-11-03 DIAGNOSIS — G6289 Other specified polyneuropathies: Secondary | ICD-10-CM

## 2023-11-04 LAB — ERYTHROPOIETIN: Erythropoietin: 6.4 m[IU]/mL (ref 2.6–18.5)

## 2023-11-05 ENCOUNTER — Encounter (HOSPITAL_COMMUNITY): Payer: Self-pay | Admitting: Cardiology

## 2023-11-05 LAB — PROTEIN ELECTROPHORESIS, SERUM, WITH REFLEX
A/G Ratio: 1.5 (ref 0.7–1.7)
Albumin ELP: 3.8 g/dL (ref 2.9–4.4)
Alpha-1-Globulin: 0.2 g/dL (ref 0.0–0.4)
Alpha-2-Globulin: 0.7 g/dL (ref 0.4–1.0)
Beta Globulin: 1 g/dL (ref 0.7–1.3)
Gamma Globulin: 0.7 g/dL (ref 0.4–1.8)
Globulin, Total: 2.5 g/dL (ref 2.2–3.9)
Total Protein ELP: 6.3 g/dL (ref 6.0–8.5)

## 2023-11-06 ENCOUNTER — Telehealth: Payer: Self-pay | Admitting: Internal Medicine

## 2023-11-06 NOTE — Telephone Encounter (Signed)
Scheduled appointments with the patient

## 2023-11-13 ENCOUNTER — Telehealth (HOSPITAL_COMMUNITY): Payer: Self-pay | Admitting: *Deleted

## 2023-11-13 NOTE — Telephone Encounter (Signed)
 Called to confirm/remind patient of their appointment at [facility] on 11/13/23.       Appointment:              [x] Confirmed             [] Left mess              [] No answer/No voice mail             [] Phone not in service   Patient reminded to bring all medications and/or complete list.   Confirmed patient has transportation. Gave directions, instructed to utilize valet parking.

## 2023-11-13 NOTE — Progress Notes (Signed)
 ADVANCED HF CLINIC NOTE   PCP: No primary care provider on file. HF Cardiology: Dr. Rolan  Jurnie  Erin [name] is a 63 y.o. with history of CAD, ischemic cardiomyopathy, and PAD was referred by Dr. Milbert for evaluation of CHF.  She had NSTEMI in 11/17 with PCI to prox/mid LCx and RCA.  Subsequently, has developed ischemic cardiomyopathy.  Most recent echo in 1/21 showed EF 30-35%.  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   RHC/LHC done with exertional chest heaviness and dyspnea in 4/21 showed normal filling pressures, preserved cardiac output, and nonobstructive mild CAD.  ABIs were normal in 4/21.   In 8/21, she had syncope thought to be related to orthostasis from low BP in the setting of cardiac medication titration.  Entresto  was decreased.   She was in the ER in 11/21 with chest pain.  Troponin and COVID-19 negative. Echo in 11/21 showed EF 30-35%, normal RV.   She was hospitalized in 6/22 with left-sided weakness/numbness.  No evidence for acute CVA, ?TIA.    Echo in 11/22 showed EF 30-35% with mild LV dilation, normal RV, mild-moderate MR, IVC normal.   Echo 2/24 showed EF remains 30-35% with normal RV.   Patient had left shoulder replacement in 11/24.   In 2/25, she was diagnosed with Stage Ia LUL lung adenocarcinoma.    Echo in 3/25 showed EF 35-40%, RV normal, IVC normal. Cardolite in 4/25 showed inferior/inferolateral infarction with no ischemia.   Patient returns for followup of CHF and CAD.  She has lost significant weight in the setting of her cancer diagnosis.  She has quit smoking.  She is short of breath walking up a flight of stairs but can make it to the top without much difficulty.  No dyspnea walking on flat ground.  No chest pain.  No orthopnea/PND.  Her cardiac meds have been cut back due to low BP and orthostasis.  Currently, no orthostatic symptoms.   S/p L upper lobectomy 4/25, 5 benign lymph nodes neg for CA.    Today she returns for post hospital HF follow up. Overall feeling dizzy. BP in 70s x 2 weeks, had to call EMS last week. Appetite fair. She is SOB walking short distances on flat ground, more so after lobectomy. Denies palpitations, abnormal bleeding, CP, edema, or PND/Orthopnea. Weight at home 106 pounds. Taking all medications. Works at [employer] but dizziness has prevented her from work.  Medtronic device interrogation (personally reviewed): Stable thoracic impedence, no AT/AF, 1.2 hr/day activity, 100% VS  ECG (personally reviewed): none ordered today  PMH: 1. CAD: NSTEMI with DES to proximal and mid LCx in 11/17, staged DES x 2 to RCA later in 11/17.  - Cardiolite  (10/20): EF < 30%, large inferior and inferolateral MI with mild peri-infarct ischemia.  - LHC (4/21): Patent stents, nonobstructive CAD.  - Lexiscan  Cardiolite  (4/25): EF 35%, inferior and inferolateral infarction with no ischemia.  2. Chronic systolic CHF: Ischemic cardiomyopathy. Medtronic ICD.  - Echo (10/20): EF 35-40%, lateral WMAs, normal RV. - Echo (1/21): EF 30-35%, mild LVH, normal RV - RHC (4/21): mean RA 5, PA 29/3, mean PCWP 12, CI 3.04 - Echo (11/21): EF 30-35%, normal RV.  - Echo (11/22): EF 30-35% with mild LV dilation, normal RV, mild-moderate MR, IVC normal.  - Echo (2/24): EF 30-35%, normal RV.  - Echo (3/25): EF 35-40%, RV normal, IVC normal 3. 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  4. Carotid  stenosis: Carotid dopplers (10/20) with 60-79% LICA stenosis.  - Carotid dopplers (6/21): 40-59% BICA stenosis.  - Carotid dopplers (1/22): 40-59% RICA, 60-79% LICA.  - Carotid dopplers (4/23): 40-59% LICA - Carotid dopplers (12/23): 40-59% LICA - Carotid dopplers (12/24): 40-59% LICA stenosis 5. Prior smoker.  6. PAD: ABIs (4/21) Normal.  - Peripheral artery dopplers (12/21): bilateral plaque without focal stenosis.  7. Left shoulder replacement 11/24 8. Lung cancer: NSCLC  (adenoCA), stage Ia, diagnosed in 2/25.   Social History   Socioeconomic History   Marital status: Married    Spouse name: Not on file   Number of children: Not on file   Years of education: Not on file   Highest education level: Associate degree: occupational, Scientist, product/process development, or vocational program  Occupational History   Occupation: CNA  Tobacco Use   Smoking status: Former    Current packs/day: 1.50    Average packs/day: 1.5 packs/day for 40.0 years (60.0 ttl pk-yrs)    Types: Cigarettes   Smokeless tobacco: Never   Tobacco comments:    Smokes off and on 06/14/23 Sonny Lecher, CMA     No cigarettes since Thursday this week>> 07/06/2023  Vaping Use   Vaping status: Never Used  Substance and Sexual Activity   Alcohol use: Not Currently    Comment: occasionally   Drug use: Not Currently    Types: Marijuana   Sexual activity: Not Currently    Birth control/protection: Surgical    Comment: hyst  Other Topics Concern   Not on file  Social History Narrative   Lives with husband in [place] in a one story home with a basement.  Has 4 children.  Works as a Lawyer.  Education: CNA school.    Social Drivers of Corporate investment banker Strain: Low Risk  (10/18/2023)   Overall Financial Resource Strain (CARDIA)    Difficulty of Paying Living Expenses: Not hard at all  Food Insecurity: No Food Insecurity (10/18/2023)   Hunger Vital Sign    Worried About Running Out of Food in the Last Year: Never true    Ran Out of Food in the Last Year: Never true  Transportation Needs: No Transportation Needs (10/18/2023)   PRAPARE - Administrator, Civil Service (Medical): No    Lack of Transportation (Non-Medical): No  Physical Activity: Sufficiently Active (10/18/2023)   Exercise Vital Sign    Days of Exercise per Week: 3 days    Minutes of Exercise per Session: 60 min  Stress: Stress Concern Present (10/18/2023)   [employer] of Occupational Health - Occupational Stress  Questionnaire    Feeling of Stress : Very much  Social Connections: Socially Isolated (10/18/2023)   Social Connection and Isolation Panel    Frequency of Communication with Friends and Family: Once a week    Frequency of Social Gatherings with Friends and Family: Never    Attends Religious Services: Never    Database administrator or Organizations: No    Attends Engineer, structural: Not on file    Marital Status: Married  Catering manager Violence: Not At Risk (09/01/2023)   Humiliation, Afraid, Rape, and Kick questionnaire    Fear of Current or Ex-Partner: No    Emotionally Abused: No    Physically Abused: No    Sexually Abused: No   Family History  Problem Relation Age of Onset   Stroke Mother    Hypertension Mother    Diabetes Mother    Heart attack  Mother    Heart attack Father    Diabetes Father    Hypertension Father    CAD Father    Colon polyps Father 80       pre-cancerous    Stroke Father    Dementia Father    Hyperlipidemia Father    Arthritis Father    COPD Father        smoked and worked Archivist mills   Heart disease Father    Breast cancer Maternal Grandmother    Diabetes Maternal Grandmother    Cancer Maternal Grandfather        Tongue and esophageal   Cancer Paternal Grandmother    Anxiety disorder Daughter    Depression Daughter    Anxiety disorder Daughter    Heart failure Other    Colon cancer Neg Hx    ROS: All systems reviewed and negative except as per HPI.   Current Outpatient Medications  Medication Sig Dispense Refill   aspirin  EC 81 MG tablet Take 1 tablet (81 mg total) by mouth every evening. Okay to restart on 2/29/2025     atorvastatin  (LIPITOR ) 80 MG tablet Take 1 tablet by mouth once daily 90 tablet 0   busPIRone  (BUSPAR ) 5 MG tablet Start 5mg  twice daily before breakfast and supper for 5 days, then increase to 5mg  before meals (three times daily) for 5 days, then increase to 10mg  before breakfast and 5mg  before lunch and  supper for 5 days, then increase to 10mg  before breakfast and lunch and 5mg  before supper for 5 days, then increase to 10mg  before meals (3 times daily). With next refill we will give you 10mg  tablets. 130 tablet 0   clopidogrel  (PLAVIX ) 75 MG tablet Take 1 tablet (75 mg total) by mouth daily. Okay to restart on 07/04/2023     EPINEPHrine  (EPIPEN  2-PAK) 0.3 mg/0.3 mL IJ SOAJ injection Inject 0.3 mg into the muscle as needed for anaphylaxis. 2 each 2   Evolocumab  (REPATHA  SURECLICK) 140 MG/ML SOAJ Inject 140 mg into the skin every 14 (fourteen) days. 6 mL 3   ezetimibe  (ZETIA ) 10 MG tablet Take 1 tablet (10 mg total) by mouth daily. 90 tablet 3   FARXIGA  10 MG TABS tablet TAKE 1 TABLET BY MOUTH ONCE DAILY BEFORE BREAKFAST 90 tablet 0   fenofibrate  (TRICOR ) 145 MG tablet Take 1 tablet by mouth once daily 30 tablet 0   furosemide  (LASIX ) 20 MG tablet Every other day:  Take 3 tablets (60 mg total) every morning and 40 mg every evening alternating with 2 tablets (40 mg total) twice a day. (Patient taking differently: 40 AM 20 PM)     gabapentin  (NEURONTIN ) 300 MG capsule Take 1 capsule by mouth at bedtime 30 capsule 0   hydrOXYzine  (VISTARIL ) 25 MG capsule TAKE 1 CAPSULE BY MOUTH EVERY 8 HOURS AS NEEDED 30 capsule 0   linaclotide  (LINZESS ) 145 MCG CAPS capsule Take 1 capsule (145 mcg total) by mouth daily before breakfast. 30 capsule 11   losartan  (COZAAR ) 25 MG tablet Take 0.5 tablets (12.5 mg total) by mouth at bedtime.     MAGNESIUM -OXIDE 400 (240 Mg) MG tablet Take 1 tablet by mouth daily.     metoprolol  succinate (TOPROL  XL) 25 MG 24 hr tablet Take 1 tablet (25 mg total) by mouth at bedtime. 90 tablet 3   Multiple Vitamins-Minerals (MULTIVITAMIN WITH MINERALS) tablet Take 1 tablet by mouth daily.     nicotine  (NICODERM CQ  - DOSED IN MG/24 HOURS) 21 mg/24hr  patch Place 21 mg onto the skin daily.     nitroGLYCERIN  (NITROSTAT ) 0.4 MG SL tablet Place 1 tablet (0.4 mg total) under the tongue every 5 (five)  minutes x 3 doses as needed for chest pain (if no relief after 2nd dose, proceed to the ED for an evaluation or call 911). 25 tablet 2   ondansetron  (ZOFRAN ) 8 MG tablet Take 1 tablet (8 mg total) by mouth every 8 (eight) hours as needed for nausea or vomiting. 90 tablet 3   pantoprazole  (PROTONIX ) 40 MG tablet TAKE 1 TABLET BY MOUTH TWICE DAILY BEFORE A MEAL 60 tablet 0   potassium chloride  SA (KLOR-CON  M) 20 MEQ tablet Take 2 tablets (40 mEq total) by mouth 2 (two) times daily. 120 tablet 6   promethazine  (PHENERGAN ) 12.5 MG tablet Take 1 tablet (12.5 mg total) by mouth every 6 (six) hours as needed for nausea or vomiting. 20 tablet 0   rOPINIRole  (REQUIP ) 0.5 MG tablet Take 0.5 mg by mouth at bedtime.   0   sertraline  (ZOLOFT ) 50 MG tablet Take 1 tablet by mouth once daily 30 tablet 0   spironolactone  (ALDACTONE ) 25 MG tablet Take 1 tablet (25 mg total) by mouth at bedtime. 30 tablet 3   traZODone  (DESYREL ) 50 MG tablet Take 1 tablet (50 mg total) by mouth at bedtime. 90 tablet 1   Vitamin D , Ergocalciferol , (DRISDOL) 1.25 MG (50000 UNIT) CAPS capsule Take 50,000 Units by mouth every 7 (seven) days. Mondays     traMADol  (ULTRAM ) 50 MG tablet Take 1 tablet (50 mg total) by mouth every 6 (six) hours as needed. (Patient not taking: Reported on 11/14/2023) 40 tablet 0   No current facility-administered medications for this encounter.   Wt Readings from Last 3 Encounters:  11/14/23 48 kg (105 lb 12.8 oz)  11/02/23 48.6 kg (107 lb 1.6 oz)  10/23/23 48.4 kg (106 lb 9.6 oz)   BP 90/62   Pulse 88   Wt 48 kg (105 lb 12.8 oz)   SpO2 100%   BMI 20.66 kg/m   Physical Exam General:  NAD. No resp difficulty, thin, fatigued-appearing HEENT: Normal Neck: Supple. No JVD. Cor: Regular rate & rhythm. No rubs, gallops or murmurs. Lungs: Clear  Abdomen: Soft, nontender, nondistended.  Extremities: No cyanosis, clubbing, rash, edema Neuro: Alert & oriented x 3, moves all 4 extremities w/o difficulty.  Affect pleasant.  Assessment/Plan: 1. CAD: S/p NSTEMI in 11/17 with DEX to LCx and DES x 2 to RCA.  Cardiolite  in 10/20 with EF <30%, inferior/inferolateral infarct with peri-infarct ischemia. LHC in 4/21 showed nonobstructive mild CAD.  Cardiolite  in 4/25 was similar with inferior and inferolateral infarction, no ischemia.  No chest pain.  - Continue atorvastatin  + fenofibrate  + Repatha . Check lipids today.  - She has been on ASA 81 and Plavix  75 mg daily.   2. Chronic systolic CHF: Ischemic cardiomyopathy.  RHC in 4/21 with normal filling pressures and preserved cardiac output.  Echo 2/24 showed stable EF 30-35%.  Echo in 3/25 showed EF 35-40%, normal RV.  NYHA class II, GDMT limited by hypotension. She is not volume overloaded by exam or device. - Stop losartan . - Decrease spiro to 12.5 mg at bedtime. BMET and BNP today. - Continue Lasix  40 qam/20 qpm + 40 KCL bid. - Continue Farixga 10 mg daily.   - Continue Toprol  XL 25 mg at bedtime.  - Candidate for CCM. Will have her assessed for this by EP after lung cancer  treatment.  3. Carotid stenosis: Carotid dopplers 12/24 showed 40-59% LICA stenosis - Repeat in 1 year 4. Smoking: She is staying off cigarettes since 02/2023. Congratulated. - Titrate off nicotine  patch.  5. 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  - Continue Plavix  per Dr. Dolphus (on hold for lung cancer surgery).  6. Lung cancer: Non-small cell lung CA, adenocarcinoma, diagnosed 2/25. S/p robot assisted thoracoscopy and lobectomy by Dr. Shyrl 4/25. 5/5 lymph nodes negative for cancer. - She follows with Dr. Sherrod   Follow up in 3 weeks with PharmD (BP and med check; I asked her to bring BP cuff to visit to correlate) and 4 months with Dr. Rolan [facility] FNP-BC 11/14/2023

## 2023-11-14 ENCOUNTER — Ambulatory Visit (HOSPITAL_COMMUNITY)
Admission: RE | Admit: 2023-11-14 | Discharge: 2023-11-14 | Disposition: A | Discharge status: Home / Self Care | Source: Ambulatory Visit | Attending: Family Medicine | Admitting: Family Medicine

## 2023-11-14 ENCOUNTER — Ambulatory Visit (HOSPITAL_COMMUNITY): Payer: Self-pay | Admitting: Family Medicine

## 2023-11-14 ENCOUNTER — Encounter (HOSPITAL_COMMUNITY): Payer: Self-pay

## 2023-11-14 VITALS — BP 90/62 | HR 88 | Wt 105.8 lb

## 2023-11-14 DIAGNOSIS — I6523 Occlusion and stenosis of bilateral carotid arteries: Secondary | ICD-10-CM | POA: Diagnosis not present

## 2023-11-14 DIAGNOSIS — Z87891 Personal history of nicotine dependence: Secondary | ICD-10-CM | POA: Insufficient documentation

## 2023-11-14 DIAGNOSIS — I255 Ischemic cardiomyopathy: Secondary | ICD-10-CM | POA: Diagnosis not present

## 2023-11-14 DIAGNOSIS — Z96612 Presence of left artificial shoulder joint: Secondary | ICD-10-CM | POA: Diagnosis not present

## 2023-11-14 DIAGNOSIS — I5042 Chronic combined systolic (congestive) and diastolic (congestive) heart failure: Secondary | ICD-10-CM

## 2023-11-14 DIAGNOSIS — Z955 Presence of coronary angioplasty implant and graft: Secondary | ICD-10-CM | POA: Diagnosis not present

## 2023-11-14 DIAGNOSIS — Z95810 Presence of automatic (implantable) cardiac defibrillator: Secondary | ICD-10-CM | POA: Diagnosis not present

## 2023-11-14 DIAGNOSIS — I2510 Atherosclerotic heart disease of native coronary artery without angina pectoris: Secondary | ICD-10-CM | POA: Insufficient documentation

## 2023-11-14 DIAGNOSIS — I252 Old myocardial infarction: Secondary | ICD-10-CM | POA: Insufficient documentation

## 2023-11-14 DIAGNOSIS — I739 Peripheral vascular disease, unspecified: Secondary | ICD-10-CM | POA: Insufficient documentation

## 2023-11-14 DIAGNOSIS — I5022 Chronic systolic (congestive) heart failure: Secondary | ICD-10-CM | POA: Diagnosis not present

## 2023-11-14 DIAGNOSIS — Z7982 Long term (current) use of aspirin: Secondary | ICD-10-CM | POA: Insufficient documentation

## 2023-11-14 DIAGNOSIS — I671 Cerebral aneurysm, nonruptured: Secondary | ICD-10-CM

## 2023-11-14 DIAGNOSIS — Z85118 Personal history of other malignant neoplasm of bronchus and lung: Secondary | ICD-10-CM | POA: Diagnosis not present

## 2023-11-14 DIAGNOSIS — C3490 Malignant neoplasm of unspecified part of unspecified bronchus or lung: Secondary | ICD-10-CM | POA: Diagnosis not present

## 2023-11-14 DIAGNOSIS — I6529 Occlusion and stenosis of unspecified carotid artery: Secondary | ICD-10-CM | POA: Diagnosis not present

## 2023-11-14 LAB — LIPID PANEL
Cholesterol: 122 mg/dL (ref 0–200)
HDL: 49 mg/dL (ref >40)
LDL Cholesterol: 37 mg/dL (ref 0–99)
Total CHOL/HDL Ratio: 2.5 ratio
Triglycerides: 182 mg/dL — ABNORMAL HIGH (ref <150)
VLDL: 36 mg/dL (ref 0–40)

## 2023-11-14 LAB — BASIC METABOLIC PANEL WITH GFR
Anion gap: 12 (ref 5–15)
BUN: 16 mg/dL (ref 8–23)
CO2: 24 mmol/L (ref 22–32)
Calcium: 9.7 mg/dL (ref 8.9–10.3)
Chloride: 102 mmol/L (ref 98–111)
Creatinine, Ser: 1.9 mg/dL — ABNORMAL HIGH (ref 0.44–1.00)
GFR, Estimated: 30 mL/min — ABNORMAL LOW (ref >60)
Glucose, Bld: 102 mg/dL — ABNORMAL HIGH (ref 70–99)
Potassium: 3.5 mmol/L (ref 3.5–5.1)
Sodium: 138 mmol/L (ref 135–145)

## 2023-11-14 LAB — BRAIN NATRIURETIC PEPTIDE: B Natriuretic Peptide: 66.1 pg/mL (ref 0.0–100.0)

## 2023-11-14 MED ORDER — SPIRONOLACTONE 25 MG PO TABS: 12.5000 mg | ORAL_TABLET | Freq: Every day | ORAL | 3 refills | Status: DC

## 2023-11-14 MED ORDER — POTASSIUM CHLORIDE CRYS ER 20 MEQ PO TBCR
20.0000 meq | EXTENDED_RELEASE_TABLET | Freq: Every day | ORAL | 3 refills | Status: DC
Start: 2023-11-14 — End: 2023-12-07

## 2023-11-14 MED ORDER — FUROSEMIDE 20 MG PO TABS: 20.0000 mg | ORAL_TABLET | Freq: Every day | ORAL | 3 refills | Status: DC

## 2023-11-14 NOTE — Telephone Encounter (Signed)
 Called patient per Erin Gainer, NP with following lab results and instructions:  Renal function elevated,  decrease Lasix  to 20 mg daily. Decrease KCL to 20 daily. Repeat BMET in 1 week.  Pt verbalized understanding of same. Repeat lab ordered and scheduled.

## 2023-11-14 NOTE — Patient Instructions (Signed)
 Stop Losartan . Decrease Spiro to 12.5 mg daily - updated Rx sent. Labs today - will call you if abnormal. Return to Heart Failure Pharmacy Clinic in 3 - 4 weeks. See below. Bring your BP cuff to this visit, please. Return to see Dr. Rolan in 4 months.  PLEASE CALL [phone] IN OCTOBER TO SCHEDULE THIS APPOINTMENT.  Please call us  at [phone] if any questions or concerns prior to next visit.

## 2023-11-15 ENCOUNTER — Ambulatory Visit (INDEPENDENT_AMBULATORY_CARE_PROVIDER_SITE_OTHER): Payer: Medicaid Other

## 2023-11-15 DIAGNOSIS — I255 Ischemic cardiomyopathy: Secondary | ICD-10-CM | POA: Diagnosis not present

## 2023-11-16 ENCOUNTER — Other Ambulatory Visit: Payer: Self-pay | Admitting: Cardiology

## 2023-11-16 ENCOUNTER — Other Ambulatory Visit: Payer: Self-pay | Admitting: Thoracic Surgery (Cardiothoracic Vascular Surgery)

## 2023-11-16 ENCOUNTER — Other Ambulatory Visit: Payer: Self-pay | Admitting: Internal Medicine

## 2023-11-16 DIAGNOSIS — F410 Panic disorder [episodic paroxysmal anxiety] without agoraphobia: Secondary | ICD-10-CM

## 2023-11-16 DIAGNOSIS — F5104 Psychophysiologic insomnia: Secondary | ICD-10-CM

## 2023-11-16 LAB — CUP PACEART REMOTE DEVICE CHECK
Battery Remaining Longevity: 101 mo
Battery Voltage: 3.01 V
Brady Statistic RV Percent Paced: 0.01 %
Date Time Interrogation Session: 20250709033626
HighPow Impedance: 83 Ohm
Implantable Lead Connection Status: 753985
Implantable Lead Implant Date: 20211126
Implantable Lead Location: 753860
Implantable Pulse Generator Implant Date: 20211126
Lead Channel Impedance Value: 437 Ohm
Lead Channel Impedance Value: 513 Ohm
Lead Channel Pacing Threshold Amplitude: 0.875 V
Lead Channel Pacing Threshold Pulse Width: 0.4 ms
Lead Channel Sensing Intrinsic Amplitude: 4.5 mV
Lead Channel Sensing Intrinsic Amplitude: 4.5 mV
Lead Channel Setting Pacing Amplitude: 2 V
Lead Channel Setting Pacing Pulse Width: 0.4 ms
Lead Channel Setting Sensing Sensitivity: 0.3 mV
Zone Setting Status: 755011
Zone Setting Status: 755011

## 2023-11-20 ENCOUNTER — Encounter

## 2023-11-22 ENCOUNTER — Other Ambulatory Visit (HOSPITAL_COMMUNITY)

## 2023-11-22 ENCOUNTER — Ambulatory Visit: Payer: Self-pay | Admitting: Cardiology

## 2023-11-23 ENCOUNTER — Ambulatory Visit (HOSPITAL_COMMUNITY)
Admission: RE | Admit: 2023-11-23 | Discharge: 2023-11-23 | Disposition: A | Discharge status: Home / Self Care | Source: Ambulatory Visit | Attending: Internal Medicine | Admitting: Internal Medicine

## 2023-11-23 DIAGNOSIS — Z95810 Presence of automatic (implantable) cardiac defibrillator: Secondary | ICD-10-CM | POA: Diagnosis not present

## 2023-11-23 DIAGNOSIS — I5022 Chronic systolic (congestive) heart failure: Secondary | ICD-10-CM | POA: Insufficient documentation

## 2023-11-23 LAB — BASIC METABOLIC PANEL WITH GFR
Anion gap: 12 (ref 5–15)
BUN: 24 mg/dL — ABNORMAL HIGH (ref 8–23)
CO2: 22 mmol/L (ref 22–32)
Calcium: 9.6 mg/dL (ref 8.9–10.3)
Chloride: 105 mmol/L (ref 98–111)
Creatinine, Ser: 2.1 mg/dL — ABNORMAL HIGH (ref 0.44–1.00)
GFR, Estimated: 26 mL/min — ABNORMAL LOW (ref >60)
Glucose, Bld: 75 mg/dL (ref 70–99)
Potassium: 4.4 mmol/L (ref 3.5–5.1)
Sodium: 139 mmol/L (ref 135–145)

## 2023-11-27 ENCOUNTER — Ambulatory Visit (HOSPITAL_COMMUNITY): Payer: Self-pay | Admitting: Family Medicine

## 2023-11-27 DIAGNOSIS — I5022 Chronic systolic (congestive) heart failure: Secondary | ICD-10-CM

## 2023-11-27 NOTE — Telephone Encounter (Addendum)
 Pt aware, agreeable, and verbalized understanding  Labs ordered  an be repeated at pharmacy follow up on 12/12/23  ----- Message from Harlene CHRISTELLA Gainer sent at 11/27/2023  1:35 PM EDT ----- Renal function up a bit, repeat BMET in 10-14 days to follow ----- Message ----- From: Rebecka, Lab In [place] Sent: 11/23/2023   4:15 PM EDT To: Harlene CHRISTELLA Gainer, FNP

## 2023-11-29 ENCOUNTER — Encounter

## 2023-11-29 NOTE — Progress Notes (Signed)
 Advanced Heart Failure Clinic Note    PCP: No primary care provider on file. HF Cardiology: Dr. Rolan  HPI:  Erin  MARIEANN [name] is a 63 y.o. with history of CAD, ischemic cardiomyopathy, and PAD was referred by Dr. Milbert for evaluation of CHF.  She had NSTEMI in 03/2016 with PCI to prox/mid LCx and RCA.  Subsequently, has developed ischemic cardiomyopathy.  Echo in 05/2019 showed EF 30-35%.  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    RHC/LHC done with exertional chest heaviness and dyspnea in 08/2019 showed normal filling pressures, preserved cardiac output, and nonobstructive mild CAD.  ABIs were normal in 08/2019.    In 12/2019, she had syncope thought to be related to orthostasis from low BP in the setting of cardiac medication titration.  Entresto  was decreased.    She was in the ER in 03/2020 with chest pain.  Troponin and COVID-19 negative. Echo in 03/2020 showed EF 30-35%, normal RV.    She was hospitalized in 10/2020 with left-sided weakness/numbness.  No evidence for acute CVA, ?TIA.     Echo in 03/2021 showed EF 30-35% with mild LV dilation, normal RV, mild-moderate MR, IVC normal.    Echo 06/2022 showed EF remains 30-35% with normal RV.    Patient had left shoulder replacement in 03/2023.    In 06/2023, she was diagnosed with Stage Ia LUL lung adenocarcinoma.     Echo in 07/2023 showed EF 35-40%, RV normal, IVC normal. Cardolite in 4/25 showed inferior/inferolateral infarction with no ischemia.    Patient returned for followup of CHF and CAD on 08/15/23 with Dr. Rolan.  She had lost significant weight in the setting of her cancer diagnosis.  She had quit smoking.  She was short of breath walking up a flight of stairs but could make it to the top without much difficulty.  No dyspnea walking on flat ground.  No chest pain.  No orthopnea/PND.  Her cardiac meds had been cut back due to low BP and orthostasis.  Currently, no orthostatic  symptoms.    S/p L upper lobectomy 08/2023, 5 benign lymph nodes neg for CA.    Returned for post hospital HF follow up 11/14/23. Overall was feeling dizzy. BP in 70s x 2 weeks, had to call EMS last week. Appetite was fair. She was SOB walking short distances on flat ground, more so after lobectomy. Denied palpitations, abnormal bleeding, CP, edema, or PND/Orthopnea. Weight at home was 106 pounds. Reported taking all medications. Works at [employer] but dizziness has been preventing her from work.  Today she returns to HF clinic for pharmacist medication titration. At last visit with APP losartan  was discontinued and spironolactone  was decreased to 12.5 mg daily and changed to bedtime dosing. Additionally, labs returned after visit and showed Scr elevation so Lasix  was decreased to 20 mg daily and KCL was decreased to 20 mEq daily. Then on 12/06/23, transmitted Optivol reading showed increased thoracic impedence suggestive of fluid accumulation. She was instructed to take Lasix  40 mg BID x3 days then take Lasix  40 mg daily. She also was instructed to increase KCL to 40 mEq BID x3 days then take KCL 40 mEq daily thereafter. She was able to make those changes. Overall she is feeling much better since last visit. Notes that dizziness has resolved since losartan  was discontinued. BP at home remains 90-100/55-60s. Notes BP only dropped twice since last visit and she was sick those days. Said she  couldn't eat or drink, but feels much better after her GI MD called her in some anti-nausea medication. Says she sometimes gets SOB, but thinks this is residual from her surgery. Says this SOB feels different from the SOB and fatigue she feels when she carries extra fluid. Does not feel like she is carrying extra fluid now. No LEE, but this is normal as she tends to carry fluid in her lungs, not legs. Notes appetite has significantly improved now that nausea has resolved. Has been eating well and has been avoiding junk food and  salt. Believes this has helped her gain caloric weight, rather than fluid weight.   HF Medications: Metoprolol  succinate 25 mg daily Spironolactone  12.5 mg daily Farxiga  10 mg daily Lasix  40 mg daily + KCL 40 mEq daily  Has the patient been experiencing any side effects to the medications prescribed?  no  Does the patient have any problems obtaining medications due to transportation or finances?   No; [employer]  Understanding of regimen: good Understanding of indications: good Potential of compliance: good Patient understands to avoid NSAIDs. Patient understands to avoid decongestants.    Pertinent Lab Values: 11/23/23: Serum creatinine 2.10, BUN 24, Potassium 4.4, Sodium 139 BMET, BNP today pending  Vital Signs: Weight: 110.4 lbs (last clinic weight: 105.8 lbs) Blood pressure: 96/62  Heart rate: 72   Assessment/Plan: 1. CAD: S/p NSTEMI in 03/2016 with DEX to LCx and DES x 2 to RCA.  Cardiolite  in 02/2019 with EF <30%, inferior/inferolateral infarct with peri-infarct ischemia. LHC in 08/2019 showed nonobstructive mild CAD.  Cardiolite  in 08/2023 was similar with inferior and inferolateral infarction, no ischemia.  No chest pain.  - Continue atorvastatin  + fenofibrate  + Repatha .  - She has been on ASA 81 and Plavix  75 mg daily.   2. Chronic systolic CHF: Ischemic cardiomyopathy.  RHC in 08/2019 with normal filling pressures and preserved cardiac output.  Echo 06/2022 showed stable EF 30-35%.  Echo in 07/2023 showed EF 35-40%, normal RV.   - NYHA class II, GDMT limited by hypotension. She is not volume overloaded by exam. - BMET, BNP today pending. Discussed with [name], will draw today and cancel lab visit 12/15/23.  - Continue Lasix  40 mg daily + 40 mEq KCL daily. - Continue metoprolol  XL 25 mg at bedtime.  - Continue spironolactone  12.5 mg at bedtime.  - Continue Farixga 10 mg daily - Losartan  recently discontinued 11/2023, dizziness has resolved with this  discontinuation.    - Candidate for CCM. Will have her assessed for this by EP after lung cancer treatment.  3. Carotid stenosis: Carotid dopplers 04/2023 showed 40-59% LICA stenosis - Repeat in 1 year 4. Smoking: She is staying off cigarettes since 02/2023. Congratulated. - Titrate off nicotine  patch.  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  - Continue Plavix  per Dr. Dolphus  6. Lung cancer: Non-small cell lung CA, adenocarcinoma, diagnosed 06/2023. S/p robot assisted thoracoscopy and lobectomy by Dr. Shyrl 08/2023. 5/5 lymph nodes negative for cancer. - She follows with Dr. Sherrod   Follow up 3-4 months with Dr. Rolan.    Tinnie Redman, PharmD, BCPS, BCCP, CPP Heart Failure Clinic Pharmacist [phone]

## 2023-11-30 NOTE — Progress Notes (Signed)
 No ICM remote transmission received for 11/27/2023 and next ICM transmission scheduled for 12/11/2023.

## 2023-12-04 ENCOUNTER — Other Ambulatory Visit: Payer: Self-pay | Admitting: Internal Medicine

## 2023-12-04 ENCOUNTER — Other Ambulatory Visit: Payer: Self-pay | Admitting: Gastroenterology

## 2023-12-04 DIAGNOSIS — F410 Panic disorder [episodic paroxysmal anxiety] without agoraphobia: Secondary | ICD-10-CM

## 2023-12-04 DIAGNOSIS — F5104 Psychophysiologic insomnia: Secondary | ICD-10-CM

## 2023-12-06 ENCOUNTER — Ambulatory Visit: Attending: Cardiology

## 2023-12-06 DIAGNOSIS — Z95810 Presence of automatic (implantable) cardiac defibrillator: Secondary | ICD-10-CM | POA: Diagnosis not present

## 2023-12-06 DIAGNOSIS — I5022 Chronic systolic (congestive) heart failure: Secondary | ICD-10-CM

## 2023-12-06 NOTE — Progress Notes (Unsigned)
 EPIC Encounter for ICM Monitoring  Patient Name: Erin  CHYRL [name] is a 63 y.o. female Date: 12/06/2023 Primary Care Physican: No primary care provider on file. Primary Cardiologist: Erin [name] Electrophysiologist: Erin [name] 08/11/2021 Office Weight: 121 lbs. 03/25/2022 Office Weight 125-127 lbs 03/13/2023 Weight: 110 lbs 03/24/2023 Weight: 115 lbs 05/26/2023 Weight: 105 lbs 07/10/2023 Weight: 103 lbs 09/15/2023 Office Weight: 110 lbs 10/04/2023 Weight: 107 lbs 12/06/2023 Home Weight: 107-110 lbs                                                          Spoke with patient and heart failure questions reviewed.  Transmission results reviewed.  Pt asymptomatic for fluid accumulation.  Reports feeling well at this time and voices no complaints.     Diet:  12/06/2023 Appetite has returned and eating restaurant foods such as [employer].      Optivol thoracic impedance suggesting possible fluid accumulation starting 7/26.  Fluid pattern continues unstable and tends to have possible fluid accumulation majority of time within a month.   Prescribed: Furosemide  20 mg Take 1 tablets (20 mg total) by mouth daily (decreased to 20 mg daily on 7/8) Potassium 20 mEq take 1 tablets (20 mEq total) by mouth daily Spironolactone  25 mg take 0.5 tablet (12.5 mg total) by mouth at bedtime   Labs: 11/23/2023 Creatinine 2.10, BUN 24, Potassium 4.4, Sodium 139, GFR 26  11/14/2023 Creatinine 1.90, BUN 16, Potassium 3.5, Sodium 138, GFR 30 (Lasix  decreased to 20 mg day in response to results) 11/02/2023 Creatinine 1.54, BUN 18, Potassium 3.8, Sodium 141  A complete set of results can be found in Results Review.   Recommendations:  Advised to limit salt intake.  Sent to Erin Gainer, NP at [facility] clinic for review.  Confirmed she is taking lasix  20 mg daily and Potassium 20 mEq daily   Follow-up plan: ICM clinic phone appointment on 12/11/2023 to recheck fluid levels.   91 day device clinic remote transmission 02/14/2024.      EP/Cardiology Office Visits:   Recall 03/15/2024 with HF Clinic.  Recall 02/18/2024 with Erin Passey, PA.     Copy of ICM check sent to Dr. Inocencio.   3 month ICM trend: 12/06/2023.    12-14 Month ICM trend:     Erin GORMAN Garner, RN 12/06/2023 2:56 PM

## 2023-12-06 NOTE — Progress Notes (Unsigned)
 Attempted call to patient to advise of recommendations by [name], NP and no answer.  Mail box is full.

## 2023-12-06 NOTE — Progress Notes (Unsigned)
 Message Received: Today [place], Harlene HERO, FNP  [place], Mitzie RAMAN, RN  Increase Lasix  to 40 mg bid x 3 days, then down to 40 mg daily thereafter.  Increase KCL to 40 bid x 3 days, then down to 40 KCL daily thereafter.  She will need a BMET in 7-10 days.

## 2023-12-07 MED ORDER — POTASSIUM CHLORIDE CRYS ER 20 MEQ PO TBCR
40.0000 meq | EXTENDED_RELEASE_TABLET | Freq: Every day | ORAL | 3 refills | Status: DC
Start: 2023-12-07 — End: 2024-01-11

## 2023-12-07 MED ORDER — BUSPIRONE HCL 10 MG PO TABS: 10.0000 mg | ORAL_TABLET | Freq: Three times a day (TID) | ORAL | 5 refills | Status: DC

## 2023-12-07 MED ORDER — FUROSEMIDE 20 MG PO TABS: 40.0000 mg | ORAL_TABLET | Freq: Every day | ORAL | 3 refills | Status: AC

## 2023-12-07 NOTE — Progress Notes (Signed)
 Attempted call to patient to advise of recommendations by [name], NP and no answer.  Mail box is full.

## 2023-12-07 NOTE — Progress Notes (Signed)
 Spoke with patient.  Advised [name], NP at [facility] recommended to take:  Furosemide  20 mg take 2 tablets (40 mg total) twice a day x 3 days.   Also take Potassium 20 mEq 2 tablets (40 mEq total) twice a day x 3 days only.    After 3rd day, take Furosemide  20 mg 2 tablets (40 mg total) by mouth once a day and Potassium 20 mEq take 2 tablets (40 mEq total) by mouth once a day.     BMET Lab needed in 7 -10 days.  Prescription updated and lab scheduled for 12/15/2023 at HF clinic.  Pt verbalized understanding and agreed with plan.   Advised to call back for any questions.

## 2023-12-11 ENCOUNTER — Encounter

## 2023-12-11 ENCOUNTER — Ambulatory Visit: Attending: Cardiology

## 2023-12-11 DIAGNOSIS — I5022 Chronic systolic (congestive) heart failure: Secondary | ICD-10-CM

## 2023-12-11 DIAGNOSIS — Z95810 Presence of automatic (implantable) cardiac defibrillator: Secondary | ICD-10-CM

## 2023-12-12 ENCOUNTER — Ambulatory Visit (HOSPITAL_COMMUNITY)
Admission: RE | Admit: 2023-12-12 | Discharge: 2023-12-12 | Disposition: A | Discharge status: Home / Self Care | Source: Ambulatory Visit | Attending: Cardiology

## 2023-12-12 ENCOUNTER — Ambulatory Visit (HOSPITAL_COMMUNITY): Payer: Self-pay | Admitting: Family Medicine

## 2023-12-12 VITALS — BP 96/62 | HR 72 | Wt 110.4 lb

## 2023-12-12 DIAGNOSIS — I252 Old myocardial infarction: Secondary | ICD-10-CM | POA: Insufficient documentation

## 2023-12-12 DIAGNOSIS — I255 Ischemic cardiomyopathy: Secondary | ICD-10-CM | POA: Diagnosis not present

## 2023-12-12 DIAGNOSIS — I5042 Chronic combined systolic (congestive) and diastolic (congestive) heart failure: Secondary | ICD-10-CM | POA: Diagnosis present

## 2023-12-12 DIAGNOSIS — Z7902 Long term (current) use of antithrombotics/antiplatelets: Secondary | ICD-10-CM | POA: Insufficient documentation

## 2023-12-12 DIAGNOSIS — Z85118 Personal history of other malignant neoplasm of bronchus and lung: Secondary | ICD-10-CM | POA: Diagnosis not present

## 2023-12-12 DIAGNOSIS — I5022 Chronic systolic (congestive) heart failure: Secondary | ICD-10-CM | POA: Insufficient documentation

## 2023-12-12 DIAGNOSIS — Z87891 Personal history of nicotine dependence: Secondary | ICD-10-CM | POA: Diagnosis not present

## 2023-12-12 DIAGNOSIS — I2510 Atherosclerotic heart disease of native coronary artery without angina pectoris: Secondary | ICD-10-CM | POA: Insufficient documentation

## 2023-12-12 DIAGNOSIS — Z955 Presence of coronary angioplasty implant and graft: Secondary | ICD-10-CM | POA: Insufficient documentation

## 2023-12-12 DIAGNOSIS — I6522 Occlusion and stenosis of left carotid artery: Secondary | ICD-10-CM | POA: Diagnosis not present

## 2023-12-12 LAB — BASIC METABOLIC PANEL WITH GFR
Anion gap: 11 (ref 5–15)
BUN: 17 mg/dL (ref 8–23)
CO2: 25 mmol/L (ref 22–32)
Calcium: 9.6 mg/dL (ref 8.9–10.3)
Chloride: 104 mmol/L (ref 98–111)
Creatinine, Ser: 1.89 mg/dL — ABNORMAL HIGH (ref 0.44–1.00)
GFR, Estimated: 30 mL/min — ABNORMAL LOW (ref >60)
Glucose, Bld: 101 mg/dL — ABNORMAL HIGH (ref 70–99)
Potassium: 4.3 mmol/L (ref 3.5–5.1)
Sodium: 140 mmol/L (ref 135–145)

## 2023-12-12 LAB — BRAIN NATRIURETIC PEPTIDE: B Natriuretic Peptide: 96.4 pg/mL (ref 0.0–100.0)

## 2023-12-12 NOTE — Patient Instructions (Signed)
 It was a pleasure seeing you today!  MEDICATIONS: -No medication changes today -Call if you have questions about your medications.  LABS: -We will call you if your labs need attention.  NEXT APPOINTMENT: Return to clinic in 3-4 months with Dr. Rolan. You can call the clinic in November 2025 to schedule if you have not heard from us  by then.   In general, to take care of your heart failure: -Limit your fluid intake to 2 Liters (half-gallon) per day.   -Limit your salt intake to ideally 2-3 grams (2000-3000 mg) per day. -Weigh yourself daily and record, and bring that weight diary to your next appointment.  (Weight gain of 2-3 pounds in 1 day typically means fluid weight.) -The medications for your heart are to help your heart and help you live longer.   -Please contact us  before stopping any of your heart medications.  Call the clinic at [phone] with questions or to reschedule future appointments.

## 2023-12-12 NOTE — Progress Notes (Signed)
 EPIC Encounter for ICM Monitoring  Patient Name: Erin [name] is a 63 y.o. female Date: 12/12/2023 Primary Care Physican: No primary care provider on file. Primary Cardiologist: Rolan Electrophysiologist: Inocencio 08/11/2021 Office Weight: 121 lbs. 03/25/2022 Office Weight 125-127 lbs 03/13/2023 Weight: 110 lbs 03/24/2023 Weight: 115 lbs 05/26/2023 Weight: 105 lbs 07/10/2023 Weight: 103 lbs 09/15/2023 Office Weight: 110 lbs 10/04/2023 Weight: 107 lbs 12/06/2023 Home Weight: 107-110 lbs                                                          Spoke with patient and heart failure questions reviewed.  Transmission results reviewed.  Pt asymptomatic for fluid accumulation.  Reports feeling well at this time and voices no complaints.     Diet:  12/06/2023 Appetite has returned and eating restaurant foods such as [employer].      Optivol thoracic impedance suggesting fluid levels returned to normal after increase in Furosemide  dosage of 40 mg bid x 3 days followed by increase to 40 mg daily.   Prescribed: Furosemide  20 mg Take 2 tablets (40 mg total) by mouth daily Potassium 20 mEq take 1 tablets (20 mEq total) by mouth daily Spironolactone  25 mg take 0.5 tablet (12.5 mg total) by mouth at bedtime   Labs: 12/12/2023 Creatinine 1.89, BUN 17, Potassium 4.3, Sodium 140, GFR 30 11/23/2023 Creatinine 2.10, BUN 24, Potassium 4.4, Sodium 139, GFR 26  11/14/2023 Creatinine 1.90, BUN 16, Potassium 3.5, Sodium 138, GFR 30 (Lasix  decreased to 20 mg day in response to results) 11/02/2023 Creatinine 1.54, BUN 18, Potassium 3.8, Sodium 141  A complete set of results can be found in Results Review.   Recommendations:  No changes and encouraged to call if experiencing any fluid symptoms.   Follow-up plan: ICM clinic phone appointment on 01/09/2024.   91 day device clinic remote transmission 02/14/2024.     EP/Cardiology Office Visits:   Recall 03/15/2024 with HF Clinic.  Recall 02/18/2024 with [name],  PA.     Copy of ICM check sent to Dr. Inocencio.   3 month ICM trend: 12/11/2023.    12-14 Month ICM trend:     [name], RN 12/12/2023 2:55 PM

## 2023-12-14 ENCOUNTER — Other Ambulatory Visit (HOSPITAL_COMMUNITY): Payer: Self-pay | Admitting: Cardiology

## 2023-12-15 ENCOUNTER — Encounter (HOSPITAL_COMMUNITY): Payer: Self-pay

## 2023-12-15 ENCOUNTER — Other Ambulatory Visit (HOSPITAL_COMMUNITY)

## 2023-12-18 ENCOUNTER — Other Ambulatory Visit (HOSPITAL_COMMUNITY): Payer: Self-pay | Admitting: Cardiology

## 2023-12-18 ENCOUNTER — Other Ambulatory Visit: Payer: Self-pay

## 2023-12-18 ENCOUNTER — Telehealth: Payer: Self-pay

## 2023-12-18 DIAGNOSIS — G6289 Other specified polyneuropathies: Secondary | ICD-10-CM

## 2023-12-18 NOTE — Telephone Encounter (Signed)
 Returned call per [employer].  Pt reporting SOB starting yesterday morning 8/10. She sent remote transmission and advised does not suggesting any fluid since 8/8.  The report did suggest she had fluid from 8/5-8/7.  She took 2 breathing treatments in the past 2 days but does not seem to be helping very much.  She will go to ER if symptoms worsen.    8/11 Optivol report

## 2023-12-21 ENCOUNTER — Telehealth (HOSPITAL_COMMUNITY): Payer: Self-pay | Admitting: Cardiology

## 2023-12-21 NOTE — Telephone Encounter (Unsigned)
 Copied from CRM [id number]. Topic: Clinical - Medication Refill >> Dec 21, 2023 12:19 PM Ahlexyia S wrote: Medication: Evolocumab  (REPATHA  SURECLICK) 140 MG/ML SOAJ  Has the patient contacted their pharmacy? Yes (Agent: If no, request that the patient contact the pharmacy for the refill. If patient does not wish to contact the pharmacy document the reason why and proceed with request.) (Agent: If yes, when and what did the pharmacy advise?)  This is the patient's preferred pharmacy:  [facility] [address], [place] - [address] [place] [address] Phone: [phone] Fax: [phone]  Is this the correct pharmacy for this prescription? Yes If no, delete pharmacy and type the correct one.   Has the prescription been filled recently? No  Is the patient out of the medication? No  Has the patient been seen for an appointment in the last year OR does the patient have an upcoming appointment? Yes  Can we respond through MyChart? Yes  Agent: Please be advised that Rx refills may take up to 3 business days. We ask that you follow-up with your pharmacy.

## 2023-12-28 ENCOUNTER — Other Ambulatory Visit: Payer: Self-pay | Admitting: Gastroenterology

## 2023-12-28 ENCOUNTER — Other Ambulatory Visit: Payer: Self-pay | Admitting: Family Medicine

## 2023-12-28 DIAGNOSIS — F410 Panic disorder [episodic paroxysmal anxiety] without agoraphobia: Secondary | ICD-10-CM

## 2024-01-09 ENCOUNTER — Observation Stay (HOSPITAL_COMMUNITY)
Admission: EM | Admit: 2024-01-09 | Discharge: 2024-01-11 | Disposition: A | Discharge status: Home / Self Care | Attending: Family Medicine | Admitting: Family Medicine

## 2024-01-09 ENCOUNTER — Ambulatory Visit: Attending: Cardiology

## 2024-01-09 ENCOUNTER — Emergency Department (HOSPITAL_COMMUNITY)

## 2024-01-09 ENCOUNTER — Other Ambulatory Visit: Payer: Self-pay

## 2024-01-09 ENCOUNTER — Telehealth: Payer: Self-pay

## 2024-01-09 DIAGNOSIS — Z23 Encounter for immunization: Secondary | ICD-10-CM | POA: Insufficient documentation

## 2024-01-09 DIAGNOSIS — F419 Anxiety disorder, unspecified: Secondary | ICD-10-CM | POA: Insufficient documentation

## 2024-01-09 DIAGNOSIS — Z87891 Personal history of nicotine dependence: Secondary | ICD-10-CM | POA: Insufficient documentation

## 2024-01-09 DIAGNOSIS — Z95810 Presence of automatic (implantable) cardiac defibrillator: Secondary | ICD-10-CM

## 2024-01-09 DIAGNOSIS — I5042 Chronic combined systolic (congestive) and diastolic (congestive) heart failure: Secondary | ICD-10-CM

## 2024-01-09 DIAGNOSIS — I959 Hypotension, unspecified: Secondary | ICD-10-CM | POA: Diagnosis not present

## 2024-01-09 DIAGNOSIS — R42 Dizziness and giddiness: Secondary | ICD-10-CM | POA: Diagnosis not present

## 2024-01-09 DIAGNOSIS — Z7982 Long term (current) use of aspirin: Secondary | ICD-10-CM | POA: Insufficient documentation

## 2024-01-09 DIAGNOSIS — Z79899 Other long term (current) drug therapy: Secondary | ICD-10-CM | POA: Diagnosis not present

## 2024-01-09 DIAGNOSIS — I3139 Other pericardial effusion (noninflammatory): Secondary | ICD-10-CM | POA: Diagnosis not present

## 2024-01-09 DIAGNOSIS — E869 Volume depletion, unspecified: Secondary | ICD-10-CM | POA: Diagnosis not present

## 2024-01-09 DIAGNOSIS — C3412 Malignant neoplasm of upper lobe, left bronchus or lung: Secondary | ICD-10-CM | POA: Insufficient documentation

## 2024-01-09 DIAGNOSIS — I5022 Chronic systolic (congestive) heart failure: Secondary | ICD-10-CM

## 2024-01-09 DIAGNOSIS — R55 Syncope and collapse: Secondary | ICD-10-CM | POA: Diagnosis not present

## 2024-01-09 DIAGNOSIS — N1832 Chronic kidney disease, stage 3b: Secondary | ICD-10-CM | POA: Insufficient documentation

## 2024-01-09 DIAGNOSIS — Z7901 Long term (current) use of anticoagulants: Secondary | ICD-10-CM | POA: Diagnosis not present

## 2024-01-09 DIAGNOSIS — I2510 Atherosclerotic heart disease of native coronary artery without angina pectoris: Secondary | ICD-10-CM | POA: Diagnosis not present

## 2024-01-09 DIAGNOSIS — F411 Generalized anxiety disorder: Secondary | ICD-10-CM | POA: Diagnosis present

## 2024-01-09 DIAGNOSIS — I130 Hypertensive heart and chronic kidney disease with heart failure and stage 1 through stage 4 chronic kidney disease, or unspecified chronic kidney disease: Secondary | ICD-10-CM | POA: Diagnosis not present

## 2024-01-09 DIAGNOSIS — R079 Chest pain, unspecified: Secondary | ICD-10-CM | POA: Diagnosis not present

## 2024-01-09 DIAGNOSIS — R404 Transient alteration of awareness: Secondary | ICD-10-CM | POA: Diagnosis not present

## 2024-01-09 MED ORDER — LACTATED RINGERS IV BOLUS
1000.0000 mL | Freq: Once | INTRAVENOUS | Status: AC
  Administered 2024-01-10: 1000 mL via INTRAVENOUS

## 2024-01-09 MED ORDER — ACETAMINOPHEN 500 MG PO TABS
1000.0000 mg | ORAL_TABLET | Freq: Once | ORAL | Status: AC
  Administered 2024-01-10: 1000 mg via ORAL
  Filled 2024-01-09: qty 2

## 2024-01-09 MED ORDER — ONDANSETRON HCL 4 MG/2ML IJ SOLN
4.0000 mg | Freq: Once | INTRAMUSCULAR | Status: AC
  Administered 2024-01-10: 4 mg via INTRAVENOUS
  Filled 2024-01-09: qty 2

## 2024-01-09 NOTE — ED Provider Notes (Incomplete)
  [place] EMERGENCY DEPARTMENT AT [facility] Provider Note   CSN: [id number] Arrival date & time: 01/09/24  2303     History Chief Complaint  Patient presents with  . Dizziness    HPI Erin  CORTNY [name] is a 63 y.o. female presenting for ***.   Patient's recorded medical, surgical, social, medication list and allergies were reviewed in the Snapshot window as part of the initial history.   Review of Systems   Review of Systems  Physical Exam Updated Vital Signs BP 103/68   Pulse 68   Temp 98.3 F (36.8 C) (Oral)   Resp 16   Ht 5' (1.524 m)   Wt 50 kg   SpO2 99%   BMI 21.53 kg/m  Physical Exam   ED Course/ Medical Decision Making/ A&P    Procedures Procedures   Medications Ordered in ED Medications - No data to display  Medical Decision Making:   Erin  TAMERRA [name] is a 63 y.o. female who presented to the ED today with *** detailed above.    {crccomplexity:27900} Complete initial physical exam performed, notably the patient  was ***.    Reviewed and confirmed nursing documentation for past medical history, family history, social history.    Initial Assessment:   With the patient's presentation of ***, most likely diagnosis is ***. Other diagnoses were considered including (but not limited to) ***. These are considered less likely due to history of present illness and physical exam findings.   {crccopa:27899}  Initial Plan:  ***  ***Screening labs including CBC and Metabolic panel to evaluate for infectious or metabolic etiology of disease.  ***Urinalysis with reflex culture ordered to evaluate for UTI or relevant urologic/nephrologic pathology.  ***CXR to evaluate for structural/infectious intrathoracic pathology.  {crccardiactesting:32591::EKG to evaluate for cardiac pathology} Objective evaluation as below reviewed   Initial Study Results:   Laboratory  All laboratory results reviewed without evidence of clinically relevant pathology.    ***Exceptions include: ***   ***EKG EKG was reviewed independently. Rate, rhythm, axis, intervals all examined and without medically relevant abnormality. ST segments without concerns for elevations.    Radiology:  All images reviewed independently. ***Agree with radiology report at this time.   No results found.    Consults: Case discussed with ***.   Reassessment and Plan:   ***    ***  Clinical Impression: No diagnosis found.   Data Unavailable   Final Clinical Impression(s) / ED Diagnoses Final diagnoses:  None    Rx / DC Orders ED Discharge Orders     None

## 2024-01-09 NOTE — ED Triage Notes (Signed)
 Pt c/o dizziness, dry heaving, and chest pain.

## 2024-01-09 NOTE — Telephone Encounter (Signed)
Remote ICM transmission received.  Attempted call to patient regarding ICM remote transmission and no answer.  Mail box is full. 

## 2024-01-09 NOTE — ED Provider Notes (Signed)
  EMERGENCY DEPARTMENT AT [facility] Provider Note   CSN: [id number] Arrival date & time: 01/09/24  2303     History Chief Complaint  Patient presents with   Dizziness    HPI Erin  MANAMI [name] is a 63 y.o. female presenting for near syncope event. Patient states that she got out of bed for the bathroom and got diaphoretic, lightheaded, saw spots, had nausea. Checked her BP and it was 80s/40s Decreased PO intake HTN on multiple medications CHF on GDMT Non vertiginous. Not Spinning.  Recent lung cancer lobectomy Patient's recorded medical, surgical, social, medication list and allergies were reviewed in the Snapshot window as part of the initial history.   Review of Systems   Review of Systems  Constitutional:  Negative for chills and fever.  HENT:  Negative for ear pain and sore throat.   Eyes:  Negative for pain and visual disturbance.  Respiratory:  Negative for cough and shortness of breath.   Cardiovascular:  Negative for chest pain and palpitations.  Gastrointestinal:  Positive for nausea. Negative for abdominal pain and vomiting.  Genitourinary:  Negative for dysuria and hematuria.  Musculoskeletal:  Negative for arthralgias and back pain.  Skin:  Negative for color change and rash.  Neurological:  Positive for dizziness and light-headedness. Negative for seizures and syncope.  All other systems reviewed and are negative.   Physical Exam Updated Vital Signs BP 90/62   Pulse 67   Temp 98.3 F (36.8 C) (Oral)   Resp 17   Ht 5' (1.524 m)   Wt 50 kg   SpO2 96%   BMI 21.53 kg/m  Physical Exam Vitals and nursing note reviewed.  Constitutional:      General: She is not in acute distress.    Appearance: She is well-developed.  HENT:     Head: Normocephalic and atraumatic.  Eyes:     Conjunctiva/sclera: Conjunctivae normal.  Cardiovascular:     Rate and Rhythm: Normal rate and regular rhythm.     Heart sounds: No murmur heard. Pulmonary:      Effort: Pulmonary effort is normal. No respiratory distress.     Breath sounds: Normal breath sounds.  Abdominal:     Palpations: Abdomen is soft.     Tenderness: There is no abdominal tenderness.  Musculoskeletal:        General: No swelling.     Cervical back: Neck supple.  Skin:    General: Skin is warm and dry.     Capillary Refill: Capillary refill takes less than 2 seconds.  Neurological:     Mental Status: She is alert.  Psychiatric:        Mood and Affect: Mood normal.      ED Course/ Medical Decision Making/ A&P Clinical Course as of 01/10/24 0408  Wed Jan 10, 2024  0204 YEARS Negative [CC]    Clinical Course User Index [CC] Jerral Meth, MD    Procedures Procedures   Medications Ordered in ED Medications  lactated ringers  bolus 1,000 mL (1,000 mLs Intravenous New Bag/Given 01/10/24 0037)  acetaminophen  (TYLENOL ) tablet 1,000 mg (1,000 mg Oral Given 01/10/24 0036)  ondansetron  (ZOFRAN ) injection 4 mg (4 mg Intravenous Given 01/10/24 0034)  iohexol  (OMNIPAQUE ) 350 MG/ML injection 64 mL (64 mLs Intravenous Contrast Given 01/10/24 0231)   Medical Decision Making:   Erin  KAILIANA [name] is a 63 y.o. female who presented to the ED today with a near-syncopal episode detailed above.    Handoff received from EMS.  Patient placed on continuous vitals and telemetry monitoring while in ED which was reviewed periodically.  Complete initial physical exam performed, notably the patient  was HDS in NAD.    Reviewed and confirmed nursing documentation for past medical history, family history, social history.    Initial Assessment:   With the patient's presentation of near syncope, most likely diagnosis is orthostatic hypotension vs vasovagal episode. Other diagnoses were considered including (but not limited to) arrythmogenic syncope, valvular abnormality, PE, aortic dissection. These are considered less likely due to history of present illness and physical exam findings.   This is  most consistent with an acute life/limb threatening illness complicated by underlying chronic conditions.  Initial Plan:   Screening labs including CBC and Metabolic panel to evaluate for infectious or metabolic etiology of disease.  Urinalysis with reflex culture ordered to evaluate for UTI or relevant urologic/nephrologic pathology.  CXR to evaluate for structural/infectious intrathoracic pathology.  EKG/Troponin testing/BNP testing to evaluate for cardiac pathology. D-dimer with CTA if indicated for PE rule out given comorbid shortness of breath and near syncope Objective evaluation as below reviewed after administration of IVF/Telemetry monitoring  Initial Study Results:   Laboratory  All laboratory results reviewed without evidence of clinically relevant pathology.   Exceptions include: D-dimer positive  EKG EKG was reviewed independently. Rate, rhythm, axis, intervals all examined and without medically relevant abnormality. ST segments without concerns for elevations.    Radiology:  All images reviewed independently. Agree with radiology report at this time.   CT Angio Chest PE W and/or Wo Contrast Result Date: 01/10/2024 CLINICAL DATA:  High probability pulmonary embolism, dizziness, chest pain EXAM: CT ANGIOGRAPHY CHEST WITH CONTRAST TECHNIQUE: Multidetector CT imaging of the chest was performed using the standard protocol during bolus administration of intravenous contrast. Multiplanar CT image reconstructions and MIPs were obtained to evaluate the vascular anatomy. RADIATION DOSE REDUCTION: This exam was performed according to the departmental dose-optimization program which includes automated exposure control, adjustment of the mA and/or kV according to patient size and/or use of iterative reconstruction technique. CONTRAST:  64mL OMNIPAQUE  IOHEXOL  350 MG/ML SOLN COMPARISON:  04/28/2023 FINDINGS: Cardiovascular: Mild coronary artery calcification. Cardiac size is mildly enlarged with  left ventricular dilation noted. Left subclavian single lead pacemaker defibrillator is seen with its lead tip extending through the wall of the right ventricular apex and terminal epicardial fat. Adequate opacification of the pulmonary arterial tree. No intraluminal filling defect identified to suggest acute pulmonary embolism. Central pulmonary arteries are of normal caliber. Small, stable pericardial effusion. Mild atherosclerotic calcification within the thoracic aorta. No aortic aneurysm. Mediastinum/Nodes: No enlarged mediastinal, hilar, or axillary lymph nodes. Thyroid  gland, trachea, and esophagus demonstrate no significant findings. Lungs/Pleura: Surgical changes of left upper lobectomy are identified. Left-sided volume loss noted. Residual lungs are clear. No pneumothorax or pleural effusion. Upper Abdomen: No acute abnormality. Musculoskeletal: No chest wall abnormality. No acute or significant osseous findings. Review of the MIP images confirms the above findings. IMPRESSION: 1. No pulmonary embolism. No acute intrathoracic pathology identified. 2. Mild coronary artery calcification. Mild cardiomegaly with left ventricular dilation. 3. Left subclavian single lead pacemaker defibrillator with its lead tip extending through the wall of the right ventricular apex and terminating within the epicardial fat. This is unchanged from prior examination. 4. Surgical changes of left upper lobectomy. 5. Aortic atherosclerosis. Aortic Atherosclerosis (ICD10-I70.0). Electronically Signed   By: Dorethia Molt M.D.   On: 01/10/2024 03:36   DG Chest Portable 1 View Result Date: 01/10/2024  EXAM: 1 VIEW XRAY OF THE CHEST 01/10/2024 12:02:00 AM COMPARISON: 10/17/2023 CLINICAL HISTORY: SOB. SHOB, hurts upon deep breath in, nausea, dizziness, low BP (97/60), sweating FINDINGS: LUNGS AND PLEURA: No focal pulmonary opacity. No pulmonary edema. No pleural effusion. No pneumothorax. HEART AND MEDIASTINUM: No acute abnormality of  the cardiac and mediastinal silhouettes. Left chest wall single lead pacemaker in place. BONES AND SOFT TISSUES: No acute osseous abnormality. Left shoulder arthroplasty noted. Right upper quadrant surgical clips noted. IMPRESSION: 1. No acute process. Electronically signed by: Franky Stanford MD 01/10/2024 12:22 AM EDT RP Workstation: [id number]   CT HEAD WO CONTRAST ( ) Result Date: 01/10/2024 EXAM: CT HEAD WITHOUT CONTRAST 01/10/2024 12:17:20 AM TECHNIQUE: CT of the head was performed without the administration of intravenous contrast. Automated exposure control, iterative reconstruction, and/or weight based adjustment of the mA/kV was utilized to reduce the radiation dose to as low as reasonably achievable. COMPARISON: 05/15/2021 CLINICAL HISTORY: Headache, neuro deficit. Pt c/o dizziness, dry heaving, and chest pain. FINDINGS: BRAIN AND VENTRICLES: No acute hemorrhage. No evidence of acute infarct. No hydrocephalus. No extra-axial collection. No mass effect or midline shift. ORBITS: No acute abnormality. SINUSES: No acute abnormality. SOFT TISSUES AND SKULL: No acute soft tissue abnormality. No skull fracture. VASCULATURE: Stent within the proximal left MCA. IMPRESSION: 1. No acute intracranial abnormality. Electronically signed by: Franky Stanford MD 01/10/2024 12:21 AM EDT RP Workstation: [id number]   Final Assessment and Plan:   Patient's blood pressure remained low despite IV fluids.  BNP is elevated and given her history of heart failure, further IV hydration may be more harmful than helpful. This may be an effect of her home blood pressure medications given her serious degree of medical comorbidities.  Her near syncope remained persistent despite 5 hours of observation here in the ER.  Disposition:   Based on the above findings, I believe this patient is stable for admission.    Patient/family educated about specific findings on our evaluation and explained exact reasons for admission.   Patient/family educated about clinical situation and time was allowed to answer questions.   Admission team communicated with and agreed with need for admission. Patient admitted. Patient ready to move at this time.     Emergency Department Medication Summary:   Medications  lactated ringers  bolus 1,000 mL (1,000 mLs Intravenous New Bag/Given 01/10/24 0037)  acetaminophen  (TYLENOL ) tablet 1,000 mg (1,000 mg Oral Given 01/10/24 0036)  ondansetron  (ZOFRAN ) injection 4 mg (4 mg Intravenous Given 01/10/24 0034)  iohexol  (OMNIPAQUE ) 350 MG/ML injection 64 mL (64 mLs Intravenous Contrast Given 01/10/24 0231)         Clinical Impression:  1. Dizziness   2. Lightheadedness      Admit   Final Clinical Impression(s) / ED Diagnoses Final diagnoses:  Dizziness  Lightheadedness    Rx / DC Orders ED Discharge Orders     None         Jerral Meth, MD 01/10/24 [phone]

## 2024-01-09 NOTE — Progress Notes (Signed)
 EPIC Encounter for ICM Monitoring  Patient Name: Erin  KAIJA [name] is a 63 y.o. female Date: 01/09/2024 Primary Care Physican: No primary care provider on file. Primary Cardiologist: Rolan Electrophysiologist: Inocencio 08/11/2021 Office Weight: 121 lbs. 03/25/2022 Office Weight 125-127 lbs 03/13/2023 Weight: 110 lbs 03/24/2023 Weight: 115 lbs 05/26/2023 Weight: 105 lbs 07/10/2023 Weight: 103 lbs 09/15/2023 Office Weight: 110 lbs 10/04/2023 Weight: 107 lbs 12/06/2023 Home Weight: 107-110 lbs                                                          Attempted call to patient and unable to reach.   Transmission results reviewed.      Diet:  12/06/2023 Appetite has returned and eating restaurant foods such as [employer].      Optivol thoracic impedance suggesting possible fluid accumulation starting 8/24.   Prescribed: Furosemide  20 mg Take 2 tablets (40 mg total) by mouth daily Potassium 20 mEq take 1 tablets (20 mEq total) by mouth daily Spironolactone  25 mg take 0.5 tablet (12.5 mg total) by mouth at bedtime   Labs: 12/12/2023 Creatinine 1.89, BUN 17, Potassium 4.3, Sodium 140, GFR 30 11/23/2023 Creatinine 2.10, BUN 24, Potassium 4.4, Sodium 139, GFR 26  11/14/2023 Creatinine 1.90, BUN 16, Potassium 3.5, Sodium 138, GFR 30 (Lasix  decreased to 20 mg day in response to results) 11/02/2023 Creatinine 1.54, BUN 18, Potassium 3.8, Sodium 141  A complete set of results can be found in Results Review.   Recommendations:  Unable to reach.  Will send to HF clinic for review if patient is reached.    Follow-up plan: ICM clinic phone appointment on 01/15/2024 to recheck fluid levels.   91 day device clinic remote transmission 02/14/2024.     EP/Cardiology Office Visits:   Recall 03/15/2024 with HF Clinic.  Recall 02/18/2024 with [name], PA.     Copy of ICM check sent to Dr. Inocencio.   3 month ICM trend: 01/09/2024.    12-14 Month ICM trend:     [name], RN 01/09/2024 12:32 PM

## 2024-01-10 ENCOUNTER — Emergency Department (HOSPITAL_COMMUNITY)

## 2024-01-10 ENCOUNTER — Other Ambulatory Visit (HOSPITAL_COMMUNITY): Payer: Self-pay | Admitting: *Deleted

## 2024-01-10 ENCOUNTER — Other Ambulatory Visit (HOSPITAL_COMMUNITY)

## 2024-01-10 ENCOUNTER — Encounter (HOSPITAL_COMMUNITY): Payer: Self-pay | Admitting: Family Medicine

## 2024-01-10 DIAGNOSIS — F411 Generalized anxiety disorder: Secondary | ICD-10-CM

## 2024-01-10 DIAGNOSIS — I959 Hypotension, unspecified: Secondary | ICD-10-CM

## 2024-01-10 DIAGNOSIS — R55 Syncope and collapse: Secondary | ICD-10-CM

## 2024-01-10 DIAGNOSIS — I25119 Atherosclerotic heart disease of native coronary artery with unspecified angina pectoris: Secondary | ICD-10-CM

## 2024-01-10 DIAGNOSIS — R519 Headache, unspecified: Secondary | ICD-10-CM | POA: Diagnosis not present

## 2024-01-10 DIAGNOSIS — E869 Volume depletion, unspecified: Secondary | ICD-10-CM | POA: Diagnosis not present

## 2024-01-10 DIAGNOSIS — N1832 Chronic kidney disease, stage 3b: Secondary | ICD-10-CM | POA: Diagnosis not present

## 2024-01-10 DIAGNOSIS — C3412 Malignant neoplasm of upper lobe, left bronchus or lung: Secondary | ICD-10-CM | POA: Diagnosis not present

## 2024-01-10 DIAGNOSIS — I5042 Chronic combined systolic (congestive) and diastolic (congestive) heart failure: Secondary | ICD-10-CM

## 2024-01-10 DIAGNOSIS — R42 Dizziness and giddiness: Secondary | ICD-10-CM | POA: Diagnosis not present

## 2024-01-10 DIAGNOSIS — R071 Chest pain on breathing: Secondary | ICD-10-CM | POA: Diagnosis not present

## 2024-01-10 DIAGNOSIS — I2510 Atherosclerotic heart disease of native coronary artery without angina pectoris: Secondary | ICD-10-CM | POA: Diagnosis not present

## 2024-01-10 DIAGNOSIS — R0602 Shortness of breath: Secondary | ICD-10-CM | POA: Diagnosis not present

## 2024-01-10 DIAGNOSIS — R29818 Other symptoms and signs involving the nervous system: Secondary | ICD-10-CM | POA: Diagnosis not present

## 2024-01-10 DIAGNOSIS — R110 Nausea: Secondary | ICD-10-CM | POA: Diagnosis not present

## 2024-01-10 DIAGNOSIS — R079 Chest pain, unspecified: Secondary | ICD-10-CM | POA: Diagnosis not present

## 2024-01-10 DIAGNOSIS — I3139 Other pericardial effusion (noninflammatory): Secondary | ICD-10-CM | POA: Diagnosis not present

## 2024-01-10 LAB — COMPREHENSIVE METABOLIC PANEL WITH GFR
ALT: 13 U/L (ref 0–44)
AST: 24 U/L (ref 15–41)
Albumin: 4.1 g/dL (ref 3.5–5.0)
Alkaline Phosphatase: 43 U/L (ref 38–126)
Anion gap: 10 (ref 5–15)
BUN: 25 mg/dL — ABNORMAL HIGH (ref 8–23)
CO2: 24 mmol/L (ref 22–32)
Calcium: 9.7 mg/dL (ref 8.9–10.3)
Chloride: 103 mmol/L (ref 98–111)
Creatinine, Ser: 1.76 mg/dL — ABNORMAL HIGH (ref 0.44–1.00)
GFR, Estimated: 33 mL/min — ABNORMAL LOW (ref >60)
Glucose, Bld: 117 mg/dL — ABNORMAL HIGH (ref 70–99)
Potassium: 4.2 mmol/L (ref 3.5–5.1)
Sodium: 137 mmol/L (ref 135–145)
Total Bilirubin: 0.5 mg/dL (ref 0.0–1.2)
Total Protein: 7.1 g/dL (ref 6.5–8.1)

## 2024-01-10 LAB — CBC WITH DIFFERENTIAL/PLATELET
Abs Immature Granulocytes: 0.05 K/uL (ref 0.00–0.07)
Basophils Absolute: 0.1 K/uL (ref 0.0–0.1)
Basophils Relative: 1 %
Eosinophils Absolute: 0.1 K/uL (ref 0.0–0.5)
Eosinophils Relative: 1 %
HCT: 38.9 % (ref 36.0–46.0)
Hemoglobin: 12.8 g/dL (ref 12.0–15.0)
Immature Granulocytes: 1 %
Lymphocytes Relative: 14 %
Lymphs Abs: 1.4 K/uL (ref 0.7–4.0)
MCH: 26.4 pg (ref 26.0–34.0)
MCHC: 32.9 g/dL (ref 30.0–36.0)
MCV: 80.2 fL (ref 80.0–100.0)
Monocytes Absolute: 0.6 K/uL (ref 0.1–1.0)
Monocytes Relative: 6 %
Neutro Abs: 8.1 K/uL — ABNORMAL HIGH (ref 1.7–7.7)
Neutrophils Relative %: 77 %
Platelets: 427 K/uL — ABNORMAL HIGH (ref 150–400)
RBC: 4.85 MIL/uL (ref 3.87–5.11)
RDW: 17.2 % — ABNORMAL HIGH (ref 11.5–15.5)
WBC: 10.3 K/uL (ref 4.0–10.5)
nRBC: 0 % (ref 0.0–0.2)

## 2024-01-10 LAB — CBC
HCT: 33.3 % — ABNORMAL LOW (ref 36.0–46.0)
Hemoglobin: 10.9 g/dL — ABNORMAL LOW (ref 12.0–15.0)
MCH: 26.2 pg (ref 26.0–34.0)
MCHC: 32.7 g/dL (ref 30.0–36.0)
MCV: 80 fL (ref 80.0–100.0)
Platelets: 364 K/uL (ref 150–400)
RBC: 4.16 MIL/uL (ref 3.87–5.11)
RDW: 17.2 % — ABNORMAL HIGH (ref 11.5–15.5)
WBC: 7.4 K/uL (ref 4.0–10.5)
nRBC: 0 % (ref 0.0–0.2)

## 2024-01-10 LAB — TROPONIN I (HIGH SENSITIVITY)
Troponin I (High Sensitivity): 8 ng/L (ref <18)
Troponin I (High Sensitivity): 8 ng/L (ref <18)

## 2024-01-10 LAB — BASIC METABOLIC PANEL WITH GFR
Anion gap: 6 (ref 5–15)
BUN: 23 mg/dL (ref 8–23)
CO2: 25 mmol/L (ref 22–32)
Calcium: 9.1 mg/dL (ref 8.9–10.3)
Chloride: 106 mmol/L (ref 98–111)
Creatinine, Ser: 1.71 mg/dL — ABNORMAL HIGH (ref 0.44–1.00)
GFR, Estimated: 34 mL/min — ABNORMAL LOW (ref >60)
Glucose, Bld: 95 mg/dL (ref 70–99)
Potassium: 4.7 mmol/L (ref 3.5–5.1)
Sodium: 137 mmol/L (ref 135–145)

## 2024-01-10 LAB — LIPASE, BLOOD: Lipase: 69 U/L — ABNORMAL HIGH (ref 11–51)

## 2024-01-10 LAB — BRAIN NATRIURETIC PEPTIDE: B Natriuretic Peptide: 123 pg/mL — ABNORMAL HIGH (ref 0.0–100.0)

## 2024-01-10 LAB — D-DIMER, QUANTITATIVE: D-Dimer, Quant: 0.63 ug{FEU}/mL — ABNORMAL HIGH (ref 0.00–0.50)

## 2024-01-10 LAB — HIV ANTIBODY (ROUTINE TESTING W REFLEX): HIV Screen 4th Generation wRfx: NONREACTIVE

## 2024-01-10 MED ORDER — INFLUENZA VIRUS VACC SPLIT PF (FLUZONE) 0.5 ML IM SUSY
0.5000 mL | PREFILLED_SYRINGE | INTRAMUSCULAR | Status: DC
  Filled 2024-01-10: qty 0.5

## 2024-01-10 MED ORDER — ASPIRIN 81 MG PO TBEC
81.0000 mg | DELAYED_RELEASE_TABLET | Freq: Every evening | ORAL | Status: DC
  Administered 2024-01-10 – 2024-01-11 (×2): 81 mg via ORAL
  Filled 2024-01-10 (×2): qty 1

## 2024-01-10 MED ORDER — IOHEXOL 350 MG/ML SOLN
64.0000 mL | Freq: Once | INTRAVENOUS | Status: AC | PRN
  Administered 2024-01-10: 64 mL via INTRAVENOUS

## 2024-01-10 MED ORDER — ENSURE PLUS HIGH PROTEIN PO LIQD
237.0000 mL | Freq: Two times a day (BID) | ORAL | Status: DC
  Administered 2024-01-10 – 2024-01-11 (×3): 237 mL via ORAL

## 2024-01-10 MED ORDER — ROPINIROLE HCL 0.25 MG PO TABS
0.5000 mg | ORAL_TABLET | Freq: Every day | ORAL | Status: DC
Start: 2024-01-10 — End: 2024-01-12
  Administered 2024-01-10: 0.5 mg via ORAL
  Filled 2024-01-10: qty 2

## 2024-01-10 MED ORDER — BUSPIRONE HCL 5 MG PO TABS
5.0000 mg | ORAL_TABLET | Freq: Three times a day (TID) | ORAL | Status: DC
  Administered 2024-01-10 – 2024-01-11 (×4): 5 mg via ORAL
  Filled 2024-01-10 (×4): qty 1

## 2024-01-10 MED ORDER — ACETAMINOPHEN 650 MG RE SUPP: 650.0000 mg | Freq: Four times a day (QID) | RECTAL | Status: DC | PRN

## 2024-01-10 MED ORDER — TRAZODONE HCL 50 MG PO TABS
50.0000 mg | ORAL_TABLET | Freq: Every day | ORAL | Status: DC
Start: 2024-01-10 — End: 2024-01-12
  Administered 2024-01-10: 50 mg via ORAL
  Filled 2024-01-10: qty 1

## 2024-01-10 MED ORDER — SODIUM CHLORIDE 0.9% FLUSH
3.0000 mL | Freq: Two times a day (BID) | INTRAVENOUS | Status: DC
  Administered 2024-01-10 – 2024-01-11 (×3): 3 mL via INTRAVENOUS

## 2024-01-10 MED ORDER — BUSPIRONE HCL 5 MG PO TABS
10.0000 mg | ORAL_TABLET | Freq: Three times a day (TID) | ORAL | Status: DC
  Administered 2024-01-10 (×2): 10 mg via ORAL
  Filled 2024-01-10 (×2): qty 2

## 2024-01-10 MED ORDER — SENNA 8.6 MG PO TABS: 1.0000 | ORAL_TABLET | Freq: Every day | ORAL | Status: DC | PRN

## 2024-01-10 MED ORDER — ATORVASTATIN CALCIUM 40 MG PO TABS
80.0000 mg | ORAL_TABLET | Freq: Every day | ORAL | Status: DC
  Administered 2024-01-11: 80 mg via ORAL
  Filled 2024-01-10: qty 2

## 2024-01-10 MED ORDER — ATORVASTATIN CALCIUM 40 MG PO TABS: 80.0000 mg | ORAL_TABLET | Freq: Every day | ORAL | Status: DC

## 2024-01-10 MED ORDER — SERTRALINE HCL 25 MG PO TABS
25.0000 mg | ORAL_TABLET | Freq: Every day | ORAL | Status: DC
  Administered 2024-01-11: 25 mg via ORAL
  Filled 2024-01-10: qty 1

## 2024-01-10 MED ORDER — ACETAMINOPHEN 325 MG PO TABS
650.0000 mg | ORAL_TABLET | Freq: Four times a day (QID) | ORAL | Status: DC | PRN
  Administered 2024-01-11: 650 mg via ORAL
  Filled 2024-01-10: qty 2

## 2024-01-10 MED ORDER — PANTOPRAZOLE SODIUM 40 MG PO TBEC
40.0000 mg | DELAYED_RELEASE_TABLET | Freq: Two times a day (BID) | ORAL | Status: DC
  Administered 2024-01-10 – 2024-01-11 (×4): 40 mg via ORAL
  Filled 2024-01-10 (×4): qty 1

## 2024-01-10 MED ORDER — GABAPENTIN 300 MG PO CAPS
300.0000 mg | ORAL_CAPSULE | Freq: Every day | ORAL | Status: DC
  Administered 2024-01-10: 300 mg via ORAL
  Filled 2024-01-10: qty 1

## 2024-01-10 MED ORDER — ADULT MULTIVITAMIN W/MINERALS CH
1.0000 | ORAL_TABLET | Freq: Every day | ORAL | Status: DC
  Administered 2024-01-10 – 2024-01-11 (×2): 1 via ORAL
  Filled 2024-01-10 (×2): qty 1

## 2024-01-10 MED ORDER — CLOPIDOGREL BISULFATE 75 MG PO TABS
75.0000 mg | ORAL_TABLET | Freq: Every day | ORAL | Status: DC
Start: 2024-01-10 — End: 2024-01-12
  Administered 2024-01-10 – 2024-01-11 (×2): 75 mg via ORAL
  Filled 2024-01-10 (×2): qty 1

## 2024-01-10 MED ORDER — LACTATED RINGERS IV BOLUS
1000.0000 mL | Freq: Once | INTRAVENOUS | Status: AC
  Administered 2024-01-10: 1000 mL via INTRAVENOUS

## 2024-01-10 MED ORDER — SERTRALINE HCL 50 MG PO TABS
50.0000 mg | ORAL_TABLET | Freq: Every day | ORAL | Status: DC
  Administered 2024-01-10: 50 mg via ORAL
  Filled 2024-01-10: qty 1

## 2024-01-10 NOTE — Progress Notes (Signed)
   01/10/24 0849  TOC Brief Assessment  Insurance and Status Reviewed  Patient has primary care physician No (PCP list added)  Home environment has been reviewed Home with spouse  Prior level of function: Independent  Prior/Current Home Services No current home services  Social Drivers of Health Review SDOH reviewed no interventions necessary  Readmission risk has been reviewed Yes  Transition of care needs no transition of care needs at this time   PCP list added to AVS.   Transition of Care Department (TOC) has reviewed patient and no TOC needs have been identified at this time. We will continue to monitor patient advancement through interdisciplinary progression rounds. If new patient transition needs arise, please place a TOC consult.

## 2024-01-10 NOTE — Progress Notes (Signed)
 PROGRESS NOTE    Patient: Erin [name]                            PCP: Patient, No Pcp Per                    DOB: May 16, 1960            DOA: 01/09/2024 FMW:[id number]             DOS: 01/10/2024, 12:34 PM   LOS: 0 days   Date of Service: The patient was seen and examined on 01/10/2024  Subjective:   The patient was seen and examined this morning. Mildly hypertensive but otherwise hemodynamically stable satting 99% on room air No complaint of headache visual changes asymmetric weakness/ Denies any chest pain or shortness of breath No further episode of dizziness No issues overnight .  Brief Narrative:   Erin [name] is a 63 y.o. female with medical history significant for lung cancer status post left upper lobectomy, CAD, ischemic cardiomyopathy, CKD 3B, and anxiety, presenting with lightheadedness, generalized weakness, and near syncope.   Patient reports that she was experiencing some nausea and loss of appetite yesterday but was otherwise feeling well and was able to go about her usual activities.  She got out of bed last night to use the bathroom and became acutely lightheaded with severe generalized weakness and feeling as though she was about to lose consciousness.  She denies any chest pain or palpitations associated with this and denies focal numbness or weakness. She denies recent fever or chills. She felt to lightheaded and weak to get up on her own and EMS was called.  She was found to have SBP in the 80s and was given 500 mL of IV fluids prior to arrival in the ED.    After IVF in the ED, she feels well when supine but continues to become lightheaded when sitting up.   ED Course: Afebrile and saturating well on room air with normal HR and SBP 69 and greater.  EKG demonstrates a sinus rhythm and chest x-ray is negative for acute cardiopulmonary disease.  There are no acute findings on head CT or CTA chest.  Labs are most notable for creatinine 1.76, normal WBC, normal  troponin x 2, D-dimer 0.63, and BNP 123.      Assessment & Plan:   Principal Problem:   Near syncope Active Problems:   Hypotension   Anxiety state   Coronary artery disease involving native coronary artery of native heart   Chronic combined systolic and diastolic congestive heart failure (HCC)   Primary adenocarcinoma of upper lobe of left lung (HCC)   CKD stage 3b, GFR 30-44 ml/min (HCC)     Assessment/Plan    1. Near-syncope; hypotension  - Awake alert oriented, still hypotensive, blood pressure 109/88 - Presents with near syncope upon standing and is found to be hypotensive  - CTA is negative for PE and there is no apparent bleeding, infection, or anaphylaxis  - BP normalized with IVF in ED  - Check orthostatic vitals, -  Continue cardiac monitoring: No signs of dysrhythmia  - 2D Echocardiogram,   -Hold metoprolol  and diuretics until BP stabilizes -Continue IV fluid   2. Chronic combined systolic and diastolic CHF  - Appears compensated  -Due to hypotension with holding beta-blockers, diuretics -  monitor weight and I/Os     3. CAD  - No anginal  symptoms - Continue ASA, Plavix , and Lipitor     4. CKD 3B  - Renally-dose medications--creatinine seems to be at baseline   5. Lung cancer  - Followed by Dr. Sherrod, currently under observation, no acute findings noted on CT chest in ED     6. Anxiety  - Continue Zoloft  and Buspar     ------------------------------------------------------------------------------------------------------------------------ Nutritional status:  The patient's BMI is: Body mass index is 21.7 kg/m. I agree with the assessment and plan as outline  ------------------------------------------------------------------------------------------------------------------------  DVT prophylaxis:  SCDs Start: 01/10/24 0426   Code Status:   Code Status: Full Code  Family Communication: No family member present at bedside-  -Advance care  planning has been discussed.   Admission status:   Status is: Observation The patient remains OBS appropriate and will d/c before 2 midnights.   Disposition: From  - home             Planning for discharge in 1-2 days   Procedures:   No admission procedures for hospital encounter.   Antimicrobials:  Anti-infectives (From admission, onward)    None        Medication:   aspirin  EC  81 mg Oral QPM   [START ON 01/11/2024] atorvastatin   80 mg Oral Daily   busPIRone   10 mg Oral TID AC   clopidogrel   75 mg Oral Daily   feeding supplement  237 mL Oral BID BM   gabapentin   300 mg Oral QHS   [START ON 01/11/2024] influenza vac split trivalent PF  0.5 mL Intramuscular Tomorrow-1000   pantoprazole   40 mg Oral BID AC   rOPINIRole   0.5 mg Oral QHS   sertraline   50 mg Oral Daily   sodium chloride  flush  3 mL Intravenous Q12H   traZODone   50 mg Oral QHS    acetaminophen  **OR** acetaminophen , senna   Objective:   Vitals:   01/10/24 0505 01/10/24 0510 01/10/24 0522 01/10/24 0922  BP: 109/71 102/65 (!) 110/56 109/88  Pulse:   66 65  Resp:   18   Temp:   97.8 F (36.6 C) 98 F (36.7 C)  TempSrc:   Oral Oral  SpO2:   100% 99%  Weight:   50.4 kg   Height:   5' (1.524 m)     Intake/Output Summary (Last 24 hours) at 01/10/2024 1234 Last data filed at 01/10/2024 0449 Gross per 24 hour  Intake 1000 ml  Output --  Net 1000 ml   Filed Weights   01/09/24 2308 01/10/24 0522  Weight: 50 kg 50.4 kg     Physical examination:   General:  AAO x 3,  cooperative, no distress;   HEENT:  Normocephalic, PERRL, otherwise with in Normal limits   Neuro:  CNII-XII intact. , normal motor and sensation, reflexes intact   Lungs:   Clear to auscultation BL, Respirations unlabored,  No wheezes / crackles  Cardio:    S1/S2, RRR, No murmure, No Rubs or Gallops   Abdomen:  Soft, non-tender, bowel sounds active all four quadrants, no guarding or peritoneal signs.  Muscular  skeletal:  Limited  exam -global generalized weaknesses - in bed, able to move all 4 extremities,   2+ pulses,  symmetric, No pitting edema  Skin:  Dry, warm to touch, negative for any Rashes,  Wounds: Please see nursing documentation       ------------------------------------------------------------------------------------------------------------------------------------------    LABs:     Latest Ref Rng & Units 01/10/2024    5:58 AM 01/10/2024  12:24 AM 11/02/2023    9:11 AM  CBC  WBC 4.0 - 10.5 K/uL 7.4  10.3  7.5   Hemoglobin 12.0 - 15.0 g/dL 89.0  87.1  88.8   Hematocrit 36.0 - 46.0 % 33.3  38.9  33.6   Platelets 150 - 400 K/uL 364  427  417       Latest Ref Rng & Units 01/10/2024    5:58 AM 01/10/2024   12:24 AM 12/12/2023   11:34 AM  CMP  Glucose 70 - 99 mg/dL 95  882  898   BUN 8 - 23 mg/dL 23  25  17    Creatinine 0.44 - 1.00 mg/dL 8.28  8.23  8.10   Sodium 135 - 145 mmol/L 137  137  140   Potassium 3.5 - 5.1 mmol/L 4.7  4.2  4.3   Chloride 98 - 111 mmol/L 106  103  104   CO2 22 - 32 mmol/L 25  24  25    Calcium  8.9 - 10.3 mg/dL 9.1  9.7  9.6   Total Protein 6.5 - 8.1 g/dL  7.1    Total Bilirubin 0.0 - 1.2 mg/dL  0.5    Alkaline Phos 38 - 126 U/L  43    AST 15 - 41 U/L  24    ALT 0 - 44 U/L  13         Micro Results No results found for this or any previous visit (from the past 240 hours).  Radiology Reports CT Angio Chest PE W and/or Wo Contrast Result Date: 01/10/2024 CLINICAL DATA:  High probability pulmonary embolism, dizziness, chest pain EXAM: CT ANGIOGRAPHY CHEST WITH CONTRAST TECHNIQUE: Multidetector CT imaging of the chest was performed using the standard protocol during bolus administration of intravenous contrast. Multiplanar CT image reconstructions and MIPs were obtained to evaluate the vascular anatomy. RADIATION DOSE REDUCTION: This exam was performed according to the departmental dose-optimization program which includes automated exposure control, adjustment of the mA  and/or kV according to patient size and/or use of iterative reconstruction technique. CONTRAST:  64mL OMNIPAQUE  IOHEXOL  350 MG/ML SOLN COMPARISON:  04/28/2023 FINDINGS: Cardiovascular: Mild coronary artery calcification. Cardiac size is mildly enlarged with left ventricular dilation noted. Left subclavian single lead pacemaker defibrillator is seen with its lead tip extending through the wall of the right ventricular apex and terminal epicardial fat. Adequate opacification of the pulmonary arterial tree. No intraluminal filling defect identified to suggest acute pulmonary embolism. Central pulmonary arteries are of normal caliber. Small, stable pericardial effusion. Mild atherosclerotic calcification within the thoracic aorta. No aortic aneurysm. Mediastinum/Nodes: No enlarged mediastinal, hilar, or axillary lymph nodes. Thyroid  gland, trachea, and esophagus demonstrate no significant findings. Lungs/Pleura: Surgical changes of left upper lobectomy are identified. Left-sided volume loss noted. Residual lungs are clear. No pneumothorax or pleural effusion. Upper Abdomen: No acute abnormality. Musculoskeletal: No chest wall abnormality. No acute or significant osseous findings. Review of the MIP images confirms the above findings. IMPRESSION: 1. No pulmonary embolism. No acute intrathoracic pathology identified. 2. Mild coronary artery calcification. Mild cardiomegaly with left ventricular dilation. 3. Left subclavian single lead pacemaker defibrillator with its lead tip extending through the wall of the right ventricular apex and terminating within the epicardial fat. This is unchanged from prior examination. 4. Surgical changes of left upper lobectomy. 5. Aortic atherosclerosis. Aortic Atherosclerosis (ICD10-I70.0). Electronically Signed   By: Dorethia Molt M.D.   On: 01/10/2024 03:36   DG Chest Portable 1 View Result Date: 01/10/2024 EXAM:  1 VIEW XRAY OF THE CHEST 01/10/2024 12:02:00 AM COMPARISON: 10/17/2023  CLINICAL HISTORY: SOB. SHOB, hurts upon deep breath in, nausea, dizziness, low BP (97/60), sweating FINDINGS: LUNGS AND PLEURA: No focal pulmonary opacity. No pulmonary edema. No pleural effusion. No pneumothorax. HEART AND MEDIASTINUM: No acute abnormality of the cardiac and mediastinal silhouettes. Left chest wall single lead pacemaker in place. BONES AND SOFT TISSUES: No acute osseous abnormality. Left shoulder arthroplasty noted. Right upper quadrant surgical clips noted. IMPRESSION: 1. No acute process. Electronically signed by: Franky Stanford MD 01/10/2024 12:22 AM EDT RP Workstation: [id number]   CT HEAD WO CONTRAST ( ) Result Date: 01/10/2024 EXAM: CT HEAD WITHOUT CONTRAST 01/10/2024 12:17:20 AM TECHNIQUE: CT of the head was performed without the administration of intravenous contrast. Automated exposure control, iterative reconstruction, and/or weight based adjustment of the mA/kV was utilized to reduce the radiation dose to as low as reasonably achievable. COMPARISON: 05/15/2021 CLINICAL HISTORY: Headache, neuro deficit. Pt c/o dizziness, dry heaving, and chest pain. FINDINGS: BRAIN AND VENTRICLES: No acute hemorrhage. No evidence of acute infarct. No hydrocephalus. No extra-axial collection. No mass effect or midline shift. ORBITS: No acute abnormality. SINUSES: No acute abnormality. SOFT TISSUES AND SKULL: No acute soft tissue abnormality. No skull fracture. VASCULATURE: Stent within the proximal left MCA. IMPRESSION: 1. No acute intracranial abnormality. Electronically signed by: Franky Stanford MD 01/10/2024 12:21 AM EDT RP Workstation: [id number]    SIGNED: Adriana DELENA Grams, MD, FHM. FAAFP. Jolynn Pack - [facility] Time spent - 55 min.  In seeing, evaluating and examining the patient. Reviewing medical records, labs, drawn plan of care. [facility],  Pager (please use [email or web address] to page/ text) Please use Epic Secure Chat for non-urgent communication (7AM-7PM)  If 7PM-7AM, please  contact night-coverage [email or web address], 01/10/2024, 12:34 PM

## 2024-01-10 NOTE — Hospital Course (Addendum)
 Erin [name]  DEVANN CRIBB is a 63 y.o. female with medical history significant for lung cancer status post left upper lobectomy, CAD, ischemic cardiomyopathy, CKD 3B, and anxiety, presenting with lightheadedness, generalized weakness, and near syncope.   Patient reports that she was experiencing some nausea and loss of appetite yesterday but was otherwise feeling well and was able to go about her usual activities.  She got out of bed last night to use the bathroom and became acutely lightheaded with severe generalized weakness and feeling as though she was about to lose consciousness.  She denies any chest pain or palpitations associated with this and denies focal numbness or weakness. She denies recent fever or chills. She felt to lightheaded and weak to get up on her own and EMS was called.  She was found to have SBP in the 80s and was given 500 mL of IV fluids prior to arrival in the ED.    After IVF in the ED, she feels well when supine but continues to become lightheaded when sitting up.   ED Course: Afebrile and saturating well on room air with normal HR and SBP 69 and greater.  EKG demonstrates a sinus rhythm and chest x-ray is negative for acute cardiopulmonary disease.  There are no acute findings on head CT or CTA chest.  Labs are most notable for creatinine 1.76, normal WBC, normal troponin x 2, D-dimer 0.63, and BNP 123.      Assessment & Plan:   Principal Problem:   Near syncope Active Problems:   Hypotension   Anxiety state   Coronary artery disease involving native coronary artery of native heart   Chronic combined systolic and diastolic congestive heart failure (HCC)   Primary adenocarcinoma of upper lobe of left lung (HCC)   CKD stage 3b, GFR 30-44 ml/min (HCC)     Assessment/Plan    1. Near-syncope; hypotension  - Awake alert oriented, still hypotensive, blood pressure 109/88 - Presents with near syncope upon standing and is found to be hypotensive  - CTA is negative for PE and  there is no apparent bleeding, infection, or anaphylaxis  - BP normalized with IVF in ED  - Check orthostatic vitals, -  Continue cardiac monitoring: No signs of dysrhythmia  - 2D Echocardiogram,   -Hold metoprolol  and diuretics until BP stabilizes -Continue IV fluid   2. Chronic combined systolic and diastolic CHF  - Appears compensated  -Due to hypotension with holding beta-blockers, diuretics -  monitor weight and I/Os     3. CAD  - No anginal symptoms - Continue ASA, Plavix , and Lipitor     4. CKD 3B  - Renally-dose medications--creatinine seems to be at baseline   5. Lung cancer  - Followed by Dr. Sherrod, currently under observation, no acute findings noted on CT chest in ED     6. Anxiety  - Continue Zoloft  and Buspar 

## 2024-01-10 NOTE — ED Notes (Signed)
 Per receiving nurse pt can not go upstairs due to blood pressure. Updated pressure 107/61 (75). Pressures cycling every 5 minutes to monitor pressure.

## 2024-01-10 NOTE — ED Notes (Signed)
 Patient transported to CT

## 2024-01-10 NOTE — Progress Notes (Signed)
 Nutrition Follow-up  DOCUMENTATION CODES:   Not applicable  INTERVENTION:   -Continue regular diet -Ensure Plus High Protein po BID, each supplement provides 350 kcal and 20 grams of protein  -MVI with minerals daily  NUTRITION DIAGNOSIS:   Increased nutrient needs related to acute illness as evidenced by estimated needs.  GOAL:   Patient will meet greater than or equal to 90% of their needs  MONITOR:   PO intake, Supplement acceptance  REASON FOR ASSESSMENT:   Malnutrition Screening Tool    ASSESSMENT:   Pt with medical history significant for lung cancer status post left upper lobectomy, CAD, ischemic cardiomyopathy, CKD 3B, and anxiety, presenting with lightheadedness, generalized weakness, and near syncope.  Pt admitted with neat syncope and hypotension.   Reviewed I/O's: +1 L x 24 hours  Pt unavailable at time of visit. Attempted to speak with pt via call to hospital room phone, however, unable to reach. RD unable to obtain further nutrition-related history or complete nutrition-focused physical exam at this time.    Noted pt with history of lung cancer, currently under observation.   Case discussed in interdisciplinary rounds. Plan for orthostatic vitals and echo. PT to evaluate pt and encourage ambulation. Possible plan to d/c home tomorrow.   Per H&P, pt experienced nausea and loss of appetite one day PTA.   Case discussed with RN, who reports pt has a good appetite and did well with breakfast. No meal completion data available to assess at this time.   Reviewed wt hx; wt has been stable over the past 3 months.   Medications reviewed and include buspar , neurontin , protonix , and zoloft .   Labs reviewed.    Diet Order:   Diet Order             Diet regular Room service appropriate? Yes; Fluid consistency: Thin  Diet effective now                   EDUCATION NEEDS:   No education needs have been identified at this time  Skin:  Skin  Assessment: Reviewed RN Assessment  Last BM:  Unknown  Height:   Ht Readings from Last 1 Encounters:  01/10/24 5' (1.524 m)    Weight:   Wt Readings from Last 1 Encounters:  01/10/24 50.4 kg    Ideal Body Weight:  45.5 kg  BMI:  Body mass index is 21.7 kg/m.  Estimated Nutritional Needs:   Kcal:  1500-1700  Protein:  75-90 grams  Fluid:  1.5-1.7 L    Margery ORN, RD, LDN, CDCES Registered Dietitian III Certified Diabetes Care and Education Specialist If unable to reach this RD, please use RD Inpatient group chat on secure chat between hours of 8am-4 pm daily

## 2024-01-10 NOTE — H&P (Signed)
 History and Physical    Erin [name]  Erin [name] FMW:[id number] DOB: 03-23-1961 DOA: 01/09/2024  PCP: Patient, No Pcp Per   Patient coming from: Home   Chief Complaint: Lightheaded, general weakness   HPI: Erin  WINIFRED [name] is a 63 y.o. female with medical history significant for lung cancer status post left upper lobectomy, CAD, ischemic cardiomyopathy, CKD 3B, and anxiety, presenting with lightheadedness, generalized weakness, and near syncope.  Patient reports that she was experiencing some nausea and loss of appetite yesterday but was otherwise feeling well and was able to go about her usual activities.  She got out of bed last night to use the bathroom and became acutely lightheaded with severe generalized weakness and feeling as though she was about to lose consciousness.  She denies any chest pain or palpitations associated with this and denies focal numbness or weakness. She denies recent fever or chills. She felt to lightheaded and weak to get up on her own and EMS was called.  She was found to have SBP in the 80s and was given 500 mL of IV fluids prior to arrival in the ED.   After IVF in the ED, she feels well when supine but continues to become lightheaded when sitting up.   ED Course: Upon arrival to the ED, patient is found to be afebrile and saturating well on room air with normal HR and SBP 69 and greater.  EKG demonstrates a sinus rhythm and chest x-ray is negative for acute cardiopulmonary disease.  There are no acute findings on head CT or CTA chest.  Labs are most notable for creatinine 1.76, normal WBC, normal troponin x 2, D-dimer 0.63, and BNP 123.  Patient was treated in the ED with 1 L LR, acetaminophen , and Zofran .  Review of Systems:  All other systems reviewed and apart from HPI, are negative.  Past Medical History:  Diagnosis Date   AICD (automatic cardioverter/defibrillator) present    Allergic reaction to alpha-gal    Allergy 03/05/2022   Anemia    Anxiety state  03/25/2016   Arthritis    Basal cell carcinoma of forehead    Brain aneurysm    CHF (congestive heart failure) (HCC)    Coronary artery disease    a. 03/11/16 PCI with DES-->Prox/Mid Cx;  b. 03/14/16 PCI with DES x2-->RCA, EF 30-35%.   Depression 06/09/2010   Encounter for general adult medical examination with abnormal findings 05/04/2022   Essential hypertension    Hx   GERD (gastroesophageal reflux disease)    HFrEF (heart failure with reduced ejection fraction) (HCC)    a. 10/2016 Echo: EF 35-40%, Gr1 DD, mild focal basal septal hypertrophy, basal inflat, mid inflat, basal antlat AK. Mid infept/inf/antlat, apical lateral sev HK. Mod MR. mildly reduced RV fxn. Mild TR.   History of pneumonia    Hyperlipidemia    IBS (irritable bowel syndrome)    Ischemic cardiomyopathy    a. 10/2016 Echo: EF 35-40%, Gr1 DD.   Lung cancer [facility])    Mitral regurgitation    Neuromuscular disorder (HCC)    NSTEMI (non-ST elevated myocardial infarction) (HCC) 03/10/2016   Pneumonia 03/2016   Shingles    Squamous cell cancer of skin of nose    Thrombocytosis 03/26/2016   Tobacco abuse    Trichimoniasis    Wears dentures    Wears glasses     Past Surgical History:  Procedure Laterality Date   APPENDECTOMY     BICEPT TENODESIS Left 03/21/2023   Procedure: BICEPS TENODESIS;  Surgeon: Addie Cordella Hamilton, MD;  Location: [facility] OR;  Service: Orthopedics;  Laterality: Left;   BIOPSY  09/20/2018   Procedure: BIOPSY;  Surgeon: Shaaron Lamar HERO, MD;  [facility];  Service: Endoscopy;;  colon   BIOPSY  01/05/2021   Procedure: BIOPSY;  Surgeon: Teressa Toribio SQUIBB, MD;  Location: [facility] ENDOSCOPY;  Service: Endoscopy;;   BIOPSY  04/27/2022   Procedure: BIOPSY;  Surgeon: Cindie Carlin POUR, DO;  [facility]: [facility];  Service: Endoscopy;;   BRONCHIAL BIOPSY  07/03/2023   Procedure: BRONCHIAL BIOPSIES;  Surgeon: Shelah Lamar RAMAN, MD;  Location: [facility] ENDOSCOPY;  Service: Pulmonary;;   BRONCHIAL BRUSHINGS   07/03/2023   Procedure: BRONCHIAL BRUSHINGS;  Surgeon: Shelah Lamar RAMAN, MD;  Location: [facility] ENDOSCOPY;  Service: Pulmonary;;   BRONCHIAL NEEDLE ASPIRATION BIOPSY  07/03/2023   Procedure: BRONCHIAL NEEDLE ASPIRATION BIOPSIES;  Surgeon: Shelah Lamar RAMAN, MD;  Location: [facility] ENDOSCOPY;  Service: Pulmonary;;   BRONCHIAL WASHINGS  07/03/2023   Procedure: BRONCHIAL WASHINGS;  Surgeon: Shelah Lamar RAMAN, MD;  Location: [facility] ENDOSCOPY;  Service: Pulmonary;;   CARDIAC CATHETERIZATION N/A 03/11/2016   Procedure: Left Heart Cath and Coronary Angiography;  Surgeon: Alm LELON Clay, MD;  Location: [facility] INVASIVE CV LAB;  Service: Cardiovascular;  Laterality: N/A;   CARDIAC CATHETERIZATION N/A 03/11/2016   Procedure: Coronary Stent Intervention;  Surgeon: Alm LELON Clay, MD;  Location: [facility] INVASIVE CV LAB;  Service: Cardiovascular;  Laterality: N/A;   CARDIAC CATHETERIZATION N/A 03/14/2016   Procedure: Coronary Stent Intervention;  Surgeon: Peter M Swaziland, MD;  Location: [facility] INVASIVE CV LAB;  Service: Cardiovascular;  Laterality: N/A;   CEREBRAL ANEURYSM REPAIR  04/2019   stent placed   CHOLECYSTECTOMY OPEN  1984   COLONOSCOPY N/A 08/28/2023   Procedure: COLONOSCOPY;  Surgeon: Cindie Carlin POUR, DO;  [facility];  Service: Endoscopy;  Laterality: N/A;  1030am, asa 3, has defibrillator   COLONOSCOPY WITH PROPOFOL  N/A 09/20/2018   Procedure: COLONOSCOPY WITH PROPOFOL ;  Surgeon: Shaaron Lamar HERO, MD;  [facility];  Service: Endoscopy;  Laterality: N/A;  10:30am   CORONARY ANGIOPLASTY WITH STENT PLACEMENT  03/14/2016   ESOPHAGOGASTRODUODENOSCOPY (EGD) WITH PROPOFOL  N/A 09/20/2018   Procedure: ESOPHAGOGASTRODUODENOSCOPY (EGD) WITH PROPOFOL ;  Surgeon: Shaaron Lamar HERO, MD;  [facility];  Service: Endoscopy;  Laterality: N/A;   ESOPHAGOGASTRODUODENOSCOPY (EGD) WITH PROPOFOL  N/A 01/05/2021   Procedure: ESOPHAGOGASTRODUODENOSCOPY (EGD) WITH PROPOFOL ;  Surgeon: Teressa Toribio SQUIBB, MD;  Location: [facility]  ENDOSCOPY;  Service: Endoscopy;  Laterality: N/A;   ESOPHAGOGASTRODUODENOSCOPY (EGD) WITH PROPOFOL  N/A 04/27/2022   Procedure: ESOPHAGOGASTRODUODENOSCOPY (EGD) WITH PROPOFOL ;  Surgeon: Cindie Carlin POUR, DO;  [facility];  Service: Endoscopy;  Laterality: N/A;  9:15am, asa 3/4, ASAP   ESOPHAGOGASTRODUODENOSCOPY (EGD) WITH PROPOFOL  N/A 01/04/2023   Procedure: ESOPHAGOGASTRODUODENOSCOPY (EGD) WITH PROPOFOL ;  Surgeon: Shaaron Lamar HERO, MD;  [facility];  Service: Endoscopy;  Laterality: N/A;  130pm, asa 3   FIDUCIAL MARKER PLACEMENT  07/03/2023   Procedure: FIDUCIAL MARKER PLACEMENT;  Surgeon: Shelah Lamar RAMAN, MD;  Location: [facility] ENDOSCOPY;  Service: Pulmonary;;   FINGER ARTHROPLASTY Left 05/14/2013   Procedure: LEFT THUMB CARPAL METACARPAL ARTHROPLASTY;  Surgeon: Franky JONELLE Curia, MD;  Location: [facility];  Service: Orthopedics;  Laterality: Left;   ICD IMPLANT N/A 04/03/2020   Procedure: ICD IMPLANT;  Surgeon: Inocencio Soyla Lunger, MD;  Location: [facility] INVASIVE CV LAB;  Service: Cardiovascular;  Laterality: N/A;   INTERCOSTAL NERVE BLOCK Left 08/31/2023  Procedure: BLOCK, NERVE, INTERCOSTAL;  Surgeon: Shyrl Linnie KIDD, MD;  Location: [facility] OR;  Service: Thoracic;  Laterality: Left;   IR ANGIO INTRA EXTRACRAN SEL COM CAROTID INNOMINATE BILAT MOD SED  01/05/2017   IR ANGIO INTRA EXTRACRAN SEL COM CAROTID INNOMINATE BILAT MOD SED  03/19/2019   IR ANGIO INTRA EXTRACRAN SEL COM CAROTID INNOMINATE BILAT MOD SED  06/04/2020   IR ANGIO INTRA EXTRACRAN SEL INTERNAL CAROTID UNI L MOD SED  04/15/2019   IR ANGIO VERTEBRAL SEL VERTEBRAL BILAT MOD SED  01/05/2017   IR ANGIO VERTEBRAL SEL VERTEBRAL BILAT MOD SED  03/19/2019   IR ANGIO VERTEBRAL SEL VERTEBRAL UNI L MOD SED  06/04/2020   IR ANGIOGRAM FOLLOW UP STUDY  04/15/2019   IR RADIOLOGIST EVAL & MGMT  12/30/2016   IR TRANSCATH/EMBOLIZ  04/15/2019   IR US  GUIDE VASC ACCESS RIGHT  03/19/2019   IR US  GUIDE VASC ACCESS RIGHT   06/04/2020   JOINT REPLACEMENT     LOBECTOMY, LUNG, ROBOT-ASSISTED, USING VATS Left 08/31/2023   Procedure: LOBECTOMY, LEFT UPPER LUNG, ROBOT-ASSISTED, USING VATS;  Surgeon: Shyrl Linnie KIDD, MD;  Location: [facility] OR;  Service: Thoracic;  Laterality: Left;  Left Robotic Assisted Thoracoscopy for Left Upper Lobectomy   LYMPH NODE BIOPSY Left 08/31/2023   Procedure: LYMPH NODE BIOPSY;  Surgeon: Shyrl Linnie KIDD, MD;  Location: [facility] OR;  Service: Thoracic;  Laterality: Left;   MALONEY DILATION N/A 09/20/2018   Procedure: AGAPITO HODGKIN;  Surgeon: Shaaron Lamar HERO, MD;  [facility];  Service: Endoscopy;  Laterality: N/A;   MALONEY DILATION N/A 01/04/2023   Procedure: AGAPITO DILATION;  Surgeon: Shaaron Lamar HERO, MD;  [facility];  Service: Endoscopy;  Laterality: N/A;   RADIOLOGY WITH ANESTHESIA N/A 04/15/2019   Procedure: BARBARANN;  Surgeon: Dolphus Carrion, MD;  Location: [facility] OR;  Service: Radiology;  Laterality: N/A;   REVERSE SHOULDER ARTHROPLASTY Left 03/21/2023   Procedure: LEFT REVERSE SHOULDER ARTHROPLASTY;  Surgeon: Addie Cordella Hamilton, MD;  Location: [facility] OR;  Service: Orthopedics;  Laterality: Left;   RIGHT/LEFT HEART CATH AND CORONARY ANGIOGRAPHY N/A 08/19/2019   Procedure: RIGHT/LEFT HEART CATH AND CORONARY ANGIOGRAPHY;  Surgeon: Rolan Ezra RAMAN, MD;  Location: [facility] INVASIVE CV LAB;  Service: Cardiovascular;  Laterality: N/A;   TUBAL LIGATION  1987   VAGINAL HYSTERECTOMY  2009    Social History:   reports that she has quit smoking. Her smoking use included cigarettes. She has a 60 pack-year smoking history. She has never used smokeless tobacco. She reports that she does not currently use alcohol. She reports that she does not currently use drugs after having used the following drugs: Marijuana.  Allergies  Allergen Reactions   Alpha-Gal Shortness Of Breath, Nausea And Vomiting and Dermatitis   Other Shortness Of Breath, Diarrhea, Nausea And Vomiting and  Nausea Only    All- red meats/dairy  Medications in Capsule form    Beef-Derived Drug Products Diarrhea and Nausea And Vomiting   Chantix  [Varenicline ] Nausea Only   Pork-Derived Products Diarrhea and Nausea And Vomiting   Reglan  [Metoclopramide ]     Involuntary movements   Tape Other (See Comments)    PEELS SKIN OFF  (PAPER TAPE IS FINE)    Family History  Problem Relation Age of Onset   Stroke Mother    Hypertension Mother    Diabetes Mother    Heart attack Mother    Heart attack Father    Diabetes Father    Hypertension Father  CAD Father    Colon polyps Father 79       pre-cancerous    Stroke Father    Dementia Father    Hyperlipidemia Father    Arthritis Father    COPD Father        smoked and worked IT sales professional disease Father    Breast cancer Maternal Grandmother    Diabetes Maternal Grandmother    Cancer Maternal Grandfather        Tongue and esophageal   Cancer Paternal Grandmother    Anxiety disorder Daughter    Depression Daughter    Anxiety disorder Daughter    Heart failure Other    Colon cancer Neg Hx      Prior to Admission medications   Medication Sig Start Date End Date Taking? Authorizing Provider  aspirin  EC 81 MG tablet Take 1 tablet (81 mg total) by mouth every evening. Okay to restart on 2/29/2025 07/03/23   Byrum, Robert S, MD  atorvastatin  (LIPITOR ) 80 MG tablet Take 1 tablet by mouth once daily 10/06/23   McLean, Dalton S, MD  busPIRone  (BUSPAR ) 10 MG tablet Take 1 tablet (10 mg total) by mouth 3 (three) times daily before meals. 12/07/23   Ezzard Sonny RAMAN, PA-C  clopidogrel  (PLAVIX ) 75 MG tablet Take 1 tablet by mouth once daily 12/18/23   McLean, Dalton S, MD  EPINEPHrine  (EPIPEN  2-PAK) 0.3 mg/0.3 mL IJ SOAJ injection Inject 0.3 mg into the muscle as needed for anaphylaxis. 04/23/21   Iva Marty Saltness, MD  Evolocumab  (REPATHA  SURECLICK) 140 MG/ML SOAJ Inject 140 mg into the skin every 14 (fourteen) days. 09/07/22   Rolan Ezra RAMAN, MD  ezetimibe  (ZETIA ) 10 MG tablet Take 1 tablet (10 mg total) by mouth daily. 08/16/23   Rolan Ezra RAMAN, MD  FARXIGA  10 MG TABS tablet TAKE 1 TABLET BY MOUTH ONCE DAILY BEFORE BREAKFAST 10/03/23   McLean, Dalton S, MD  fenofibrate  (TRICOR ) 145 MG tablet Take 1 tablet by mouth once daily 11/16/23   McLean, Dalton S, MD  furosemide  (LASIX ) 20 MG tablet Take 2 tablets (40 mg total) by mouth daily. 12/07/23   Glena Harlene HERO, FNP  gabapentin  (NEURONTIN ) 300 MG capsule Take 1 capsule by mouth at bedtime 12/18/23   Zarwolo, Gloria, FNP  hydrOXYzine  (VISTARIL ) 25 MG capsule TAKE 1 CAPSULE BY MOUTH EVERY 8 HOURS AS NEEDED 09/18/23   Dixon, Phillip E, MD  linaclotide  (LINZESS ) 145 MCG CAPS capsule Take 1 capsule (145 mcg total) by mouth daily before breakfast. 11/22/22   Rourk, Lamar HERO, MD  MAGNESIUM -OXIDE 400 (240 Mg) MG tablet Take 1 tablet by mouth daily. 07/08/23   [provider]  metoprolol  succinate (TOPROL  XL) 25 MG 24 hr tablet Take 1 tablet (25 mg total) by mouth at bedtime. 08/15/23   Rolan Ezra RAMAN, MD  Multiple Vitamins-Minerals (MULTIVITAMIN WITH MINERALS) tablet Take 1 tablet by mouth daily.    [provider]  nicotine  (NICODERM CQ  - DOSED IN MG/24 HOURS) 21 mg/24hr patch Place 21 mg onto the skin daily.    [provider]  nitroGLYCERIN  (NITROSTAT ) 0.4 MG SL tablet Place 1 tablet (0.4 mg total) under the tongue every 5 (five) minutes x 3 doses as needed for chest pain (if no relief after 2nd dose, proceed to the ED for an evaluation or call 911). 09/19/22   Rolan Ezra RAMAN, MD  ondansetron  (ZOFRAN ) 8 MG tablet Take 1 tablet (8 mg total) by mouth every 8 (eight)  hours as needed for nausea or vomiting. 08/08/23   Ezzard Sonny RAMAN, PA-C  pantoprazole  (PROTONIX ) 40 MG tablet TAKE 1 TABLET BY MOUTH TWICE DAILY BEFORE A MEAL 12/04/23   Lewis, Leslie S, PA-C  potassium chloride  SA (KLOR-CON  M) 20 MEQ tablet Take 2 tablets (40 mEq total) by mouth daily. 12/07/23   Glena Harlene HERO, FNP  promethazine  (PHENERGAN ) 12.5 MG tablet TAKE 1 TABLET BY MOUTH EVERY 6 HOURS AS NEEDED FOR NAUSEA FOR VOMITING 12/30/23   Ezzard Sonny RAMAN, PA-C  rOPINIRole  (REQUIP ) 0.5 MG tablet Take 0.5 mg by mouth at bedtime.  11/13/17   [provider]  sertraline  (ZOLOFT ) 50 MG tablet Take 1 tablet by mouth once daily 12/28/23   Zarwolo, Gloria, FNP  spironolactone  (ALDACTONE ) 25 MG tablet Take 0.5 tablets (12.5 mg total) by mouth at bedtime. 11/14/23   Milford, Harlene HERO, FNP  traZODone  (DESYREL ) 50 MG tablet TAKE 1 TABLET BY MOUTH AT BEDTIME 12/04/23   Zarwolo, Gloria, FNP  Vitamin D , Ergocalciferol , (DRISDOL) 1.25 MG (50000 UNIT) CAPS capsule Take 50,000 Units by mouth every 7 (seven) days. Mondays    [provider]    Physical Exam: Vitals:   01/10/24 0343 01/10/24 0345 01/10/24 0400 01/10/24 0415  BP: 93/63 90/62 100/66 110/74  Pulse: 63 67 65 68  Resp: 17 17 16 18   Temp:      TempSrc:      SpO2: 96% 96% 95% 96%  Weight:      Height:        Constitutional: NAD, no pallor or diaphoresis   Eyes: PERTLA, lids and conjunctivae normal ENMT: Mucous membranes are moist. Posterior pharynx clear of any exudate or lesions.   Neck: supple, no masses  Respiratory: no wheezing, no crackles. No accessory muscle use.  Cardiovascular: S1 & S2 heard, regular rate and rhythm. No extremity edema.  Abdomen: No tenderness, soft. Bowel sounds active.  Musculoskeletal: no clubbing / cyanosis. No joint deformity upper and lower extremities.   Skin: no significant rashes, lesions, ulcers. Warm, dry, well-perfused. Neurologic: CN 2-12 grossly intact. Moving all extremities. Alert and oriented.  Psychiatric: Calm. Cooperative.    Labs and Imaging on Admission: I have personally reviewed following labs and imaging studies  CBC: Recent Labs  Lab 01/10/24 0024  WBC 10.3  NEUTROABS 8.1*  HGB 12.8  HCT 38.9  MCV 80.2  PLT 427*   Basic Metabolic Panel: Recent Labs  Lab  01/10/24 0024  NA 137  K 4.2  CL 103  CO2 24  GLUCOSE 117*  BUN 25*  CREATININE 1.76*  CALCIUM  9.7   GFR: Estimated Creatinine Clearance: 24.1 mL/min (A) (by C-G formula based on SCr of 1.76 mg/dL (H)). Liver Function Tests: Recent Labs  Lab 01/10/24 0024  AST 24  ALT 13  ALKPHOS 43  BILITOT 0.5  PROT 7.1  ALBUMIN  4.1   Recent Labs  Lab 01/10/24 0024  LIPASE 69*   No results for input(s): AMMONIA in the last 168 hours. Coagulation Profile: No results for input(s): INR, PROTIME in the last 168 hours. Cardiac Enzymes: No results for input(s): CKTOTAL, CKMB, CKMBINDEX, TROPONINI in the last 168 hours. BNP (last 3 results) No results for input(s): PROBNP in the last 8760 hours. HbA1C: No results for input(s): HGBA1C in the last 72 hours. CBG: No results for input(s): GLUCAP in the last 168 hours. Lipid Profile: No results for input(s): CHOL, HDL, LDLCALC, TRIG, CHOLHDL, LDLDIRECT in the last 72 hours. Thyroid  Function Tests:  No results for input(s): TSH, T4TOTAL, FREET4, T3FREE, THYROIDAB in the last 72 hours. Anemia Panel: No results for input(s): VITAMINB12, FOLATE, FERRITIN, TIBC, IRON, RETICCTPCT in the last 72 hours. Urine analysis:    Component Value Date/Time   COLORURINE YELLOW 08/29/2023 1325   APPEARANCEUR CLEAR 08/29/2023 1325   LABSPEC 1.014 08/29/2023 1325   PHURINE 6.0 08/29/2023 1325   GLUCOSEU NEGATIVE 08/29/2023 1325   HGBUR NEGATIVE 08/29/2023 1325   BILIRUBINUR NEGATIVE 08/29/2023 1325   KETONESUR NEGATIVE 08/29/2023 1325   PROTEINUR NEGATIVE 08/29/2023 1325   NITRITE NEGATIVE 08/29/2023 1325   LEUKOCYTESUR NEGATIVE 08/29/2023 1325   Sepsis Labs: @LABRCNTIP (procalcitonin:4,lacticidven:4) )No results found for this or any previous visit (from the past 240 hours).   Radiological Exams on Admission: CT Angio Chest PE W and/or Wo Contrast Result Date: 01/10/2024 CLINICAL DATA:  High  probability pulmonary embolism, dizziness, chest pain EXAM: CT ANGIOGRAPHY CHEST WITH CONTRAST TECHNIQUE: Multidetector CT imaging of the chest was performed using the standard protocol during bolus administration of intravenous contrast. Multiplanar CT image reconstructions and MIPs were obtained to evaluate the vascular anatomy. RADIATION DOSE REDUCTION: This exam was performed according to the departmental dose-optimization program which includes automated exposure control, adjustment of the mA and/or kV according to patient size and/or use of iterative reconstruction technique. CONTRAST:  64mL OMNIPAQUE  IOHEXOL  350 MG/ML SOLN COMPARISON:  04/28/2023 FINDINGS: Cardiovascular: Mild coronary artery calcification. Cardiac size is mildly enlarged with left ventricular dilation noted. Left subclavian single lead pacemaker defibrillator is seen with its lead tip extending through the wall of the right ventricular apex and terminal epicardial fat. Adequate opacification of the pulmonary arterial tree. No intraluminal filling defect identified to suggest acute pulmonary embolism. Central pulmonary arteries are of normal caliber. Small, stable pericardial effusion. Mild atherosclerotic calcification within the thoracic aorta. No aortic aneurysm. Mediastinum/Nodes: No enlarged mediastinal, hilar, or axillary lymph nodes. Thyroid  gland, trachea, and esophagus demonstrate no significant findings. Lungs/Pleura: Surgical changes of left upper lobectomy are identified. Left-sided volume loss noted. Residual lungs are clear. No pneumothorax or pleural effusion. Upper Abdomen: No acute abnormality. Musculoskeletal: No chest wall abnormality. No acute or significant osseous findings. Review of the MIP images confirms the above findings. IMPRESSION: 1. No pulmonary embolism. No acute intrathoracic pathology identified. 2. Mild coronary artery calcification. Mild cardiomegaly with left ventricular dilation. 3. Left subclavian single  lead pacemaker defibrillator with its lead tip extending through the wall of the right ventricular apex and terminating within the epicardial fat. This is unchanged from prior examination. 4. Surgical changes of left upper lobectomy. 5. Aortic atherosclerosis. Aortic Atherosclerosis (ICD10-I70.0). Electronically Signed   By: Dorethia Molt M.D.   On: 01/10/2024 03:36   DG Chest Portable 1 View Result Date: 01/10/2024 EXAM: 1 VIEW XRAY OF THE CHEST 01/10/2024 12:02:00 AM COMPARISON: 10/17/2023 CLINICAL HISTORY: SOB. SHOB, hurts upon deep breath in, nausea, dizziness, low BP (97/60), sweating FINDINGS: LUNGS AND PLEURA: No focal pulmonary opacity. No pulmonary edema. No pleural effusion. No pneumothorax. HEART AND MEDIASTINUM: No acute abnormality of the cardiac and mediastinal silhouettes. Left chest wall single lead pacemaker in place. BONES AND SOFT TISSUES: No acute osseous abnormality. Left shoulder arthroplasty noted. Right upper quadrant surgical clips noted. IMPRESSION: 1. No acute process. Electronically signed by: Franky Stanford MD 01/10/2024 12:22 AM EDT RP Workstation: [id number]   CT HEAD WO CONTRAST ( ) Result Date: 01/10/2024 EXAM: CT HEAD WITHOUT CONTRAST 01/10/2024 12:17:20 AM TECHNIQUE: CT of the head was performed without the administration of intravenous contrast.  Automated exposure control, iterative reconstruction, and/or weight based adjustment of the mA/kV was utilized to reduce the radiation dose to as low as reasonably achievable. COMPARISON: 05/15/2021 CLINICAL HISTORY: Headache, neuro deficit. Pt c/o dizziness, dry heaving, and chest pain. FINDINGS: BRAIN AND VENTRICLES: No acute hemorrhage. No evidence of acute infarct. No hydrocephalus. No extra-axial collection. No mass effect or midline shift. ORBITS: No acute abnormality. SINUSES: No acute abnormality. SOFT TISSUES AND SKULL: No acute soft tissue abnormality. No skull fracture. VASCULATURE: Stent within the proximal left MCA.  IMPRESSION: 1. No acute intracranial abnormality. Electronically signed by: Franky Stanford MD 01/10/2024 12:21 AM EDT RP Workstation: [id number]    EKG: Independently reviewed. Sinus rhythm.   Assessment/Plan   1. Near-syncope; hypotension  - Presents with near syncope upon standing and is found to be hypotensive  - CTA is negative for PE and there is no apparent bleeding, infection, or anaphylaxis  - BP normalized with IVF in ED  - Check orthostatic vitals, continue cardiac monitoring, check echocardiogram, hold metoprolol  and diuretics for now and resume as BP allows    2. Chronic combined systolic and diastolic CHF  - Appears compensated  - Hold beta-blocker and diuretics for now in light of hypotension, monitor weight and I/Os    3. CAD  - No anginal symptoms  - Continue ASA, Plavix , and Lipitor    4. CKD 3B  - Renally-dose medications    5. Lung cancer  - Followed by Dr. Sherrod, currently under observation, no acute findings noted on CT chest in ED    6. Anxiety  - Continue Zoloft  and Buspar     DVT prophylaxis: SCDs  Code Status: Full  Level of Care: Level of care: Telemetry Family Communication: Daughter at bedside  Disposition Plan:  Patient is from: home  Anticipated d/c is to: home  Anticipated d/c date is: 01/11/24 Patient currently: Pending cardiac monitoring, echocardiogram, orthostatic vitals, stable BP  Consults called: None  Admission status: Observation     Evalene GORMAN Sprinkles, MD [facility]  01/10/2024, 4:33 AM

## 2024-01-10 NOTE — Plan of Care (Signed)

## 2024-01-10 NOTE — Discharge Instructions (Signed)
   Providers Accepting New Patients in [place], [place]    [facility] [address]  [place], [place] [address] [phone] Accepts most insurances  [facility] [address] [place], [place] [address] [phone] Accepts most insurances  [facility] [address] [place], [place] [address]  [phone] Must meet requirements  [facility] [address] [place], [place] [address] [phone] Accepts most insurances  [facility] [address]  [place], [place] [address] [phone] Accepts most insurances  [facility] [address]   [place], [place]   [phone] Accepts most insurances  [facility])  [phone] [address]  [place], [place] [address] [phone] Accepts most insurances     [facility] [address]  [place], [place] [address] [phone] Accepts most insurances  [facility] [address] [place], [place] [address] [phone] option 1 Accepts Medicaid and [facility] [address]  [place], [place] [address] [phone] Accepts most insurances  Avon Gully, MD [address] [place], [place] [address] [phone] Accepts most insurances  [facility] [address]  [place], [place] [address] [phone] Accepts most insurances  [facility] [phone] [address] [place], [place] [address] [phone] Accepts most insurances  [place] [place] [address] [place], [place] [address] [phone]  Accepts most insurances

## 2024-01-11 ENCOUNTER — Observation Stay (HOSPITAL_BASED_OUTPATIENT_CLINIC_OR_DEPARTMENT_OTHER)

## 2024-01-11 DIAGNOSIS — R55 Syncope and collapse: Secondary | ICD-10-CM | POA: Diagnosis not present

## 2024-01-11 LAB — BASIC METABOLIC PANEL WITH GFR
Anion gap: 6 (ref 5–15)
BUN: 22 mg/dL (ref 8–23)
CO2: 24 mmol/L (ref 22–32)
Calcium: 9.2 mg/dL (ref 8.9–10.3)
Chloride: 108 mmol/L (ref 98–111)
Creatinine, Ser: 1.45 mg/dL — ABNORMAL HIGH (ref 0.44–1.00)
GFR, Estimated: 41 mL/min — ABNORMAL LOW (ref >60)
Glucose, Bld: 81 mg/dL (ref 70–99)
Potassium: 4 mmol/L (ref 3.5–5.1)
Sodium: 138 mmol/L (ref 135–145)

## 2024-01-11 LAB — CBC
HCT: 34.4 % — ABNORMAL LOW (ref 36.0–46.0)
Hemoglobin: 11 g/dL — ABNORMAL LOW (ref 12.0–15.0)
MCH: 26.1 pg (ref 26.0–34.0)
MCHC: 32 g/dL (ref 30.0–36.0)
MCV: 81.7 fL (ref 80.0–100.0)
Platelets: 347 K/uL (ref 150–400)
RBC: 4.21 MIL/uL (ref 3.87–5.11)
RDW: 17.3 % — ABNORMAL HIGH (ref 11.5–15.5)
WBC: 5.7 K/uL (ref 4.0–10.5)
nRBC: 0 % (ref 0.0–0.2)

## 2024-01-11 LAB — ECHOCARDIOGRAM COMPLETE
Area-P 1/2: 4.39 cm2
Calc EF: 41.7 %
Est EF: 40
Height: 60 in
MV M vel: 4.56 m/s
MV Peak grad: 83.2 mmHg
S' Lateral: 4.2 cm
Single Plane A2C EF: 52.9 %
Single Plane A4C EF: 30.5 %
Weight: 1830.7 [oz_av]

## 2024-01-11 LAB — GLUCOSE, CAPILLARY: Glucose-Capillary: 90 mg/dL (ref 70–99)

## 2024-01-11 MED ORDER — SERTRALINE HCL 25 MG PO TABS: 25.0000 mg | ORAL_TABLET | Freq: Every day | ORAL | 0 refills | Status: AC

## 2024-01-11 MED ORDER — MIDODRINE HCL 5 MG PO TABS
5.0000 mg | ORAL_TABLET | Freq: Three times a day (TID) | ORAL | Status: DC
  Administered 2024-01-11: 5 mg via ORAL
  Filled 2024-01-11: qty 1

## 2024-01-11 MED ORDER — INFLUENZA VIRUS VACC SPLIT PF (FLUZONE) 0.5 ML IM SUSY: 0.5000 mL | PREFILLED_SYRINGE | INTRAMUSCULAR | Status: DC

## 2024-01-11 MED ORDER — MIDODRINE HCL 2.5 MG PO TABS: 2.5000 mg | ORAL_TABLET | Freq: Three times a day (TID) | ORAL | 0 refills | Status: DC

## 2024-01-11 MED ORDER — METOPROLOL SUCCINATE ER 25 MG PO TB24: 12.5000 mg | ORAL_TABLET | Freq: Every evening | ORAL | 3 refills | Status: AC

## 2024-01-11 MED ORDER — MIDODRINE HCL 5 MG PO TABS
2.5000 mg | ORAL_TABLET | Freq: Three times a day (TID) | ORAL | Status: DC
  Administered 2024-01-11: 2.5 mg via ORAL
  Filled 2024-01-11: qty 1

## 2024-01-11 MED ORDER — ATORVASTATIN CALCIUM 40 MG PO TABS
40.0000 mg | ORAL_TABLET | Freq: Every day | ORAL | 11 refills | Status: AC
Start: 2024-01-11 — End: 2025-01-10

## 2024-01-11 MED ORDER — INFLUENZA VIRUS VACC SPLIT PF (FLUZONE) 0.5 ML IM SUSY
0.5000 mL | PREFILLED_SYRINGE | Freq: Once | INTRAMUSCULAR | Status: AC
  Administered 2024-01-11: 0.5 mL via INTRAMUSCULAR
  Filled 2024-01-11: qty 0.5

## 2024-01-11 MED ORDER — INFLUENZA VIRUS VACC SPLIT PF (FLUZONE) 0.5 ML IM SUSY: 0.5000 mL | PREFILLED_SYRINGE | INTRAMUSCULAR | 0 refills | Status: AC

## 2024-01-11 MED ORDER — BUSPIRONE HCL 5 MG PO TABS: 5.0000 mg | ORAL_TABLET | Freq: Three times a day (TID) | ORAL | 0 refills | Status: AC

## 2024-01-11 MED ORDER — MIDODRINE HCL 5 MG PO TABS: 5.0000 mg | ORAL_TABLET | Freq: Three times a day (TID) | ORAL | 0 refills | Status: AC

## 2024-01-11 MED ORDER — LACTATED RINGERS IV BOLUS
500.0000 mL | Freq: Once | INTRAVENOUS | Status: AC
  Administered 2024-01-11: 500 mL via INTRAVENOUS

## 2024-01-11 MED ORDER — POTASSIUM CHLORIDE CRYS ER 20 MEQ PO TBCR: 20.0000 meq | EXTENDED_RELEASE_TABLET | Freq: Every day | ORAL | 3 refills | Status: AC

## 2024-01-11 NOTE — Discharge Summary (Signed)
 Physician Discharge Summary   Patient: Erin  LONEY [name] MRN: [id number] DOB: August 01, 1960  Admit date:     01/09/2024  Discharge date: 01/11/24  Discharge Physician: Adriana DELENA Grams   PCP: Patient, No Pcp Per   Recommendations at discharge:   Follow-up with PCP in 1 week Continue to hold Lasix  due to hypotension, Continue midodrine  -blood pressure stabilizes, subject change by PCP Continue currently recommended modified reduced dose medications   Discharge Diagnoses: Principal Problem:   Near syncope Active Problems:   Hypotension   Anxiety state   Coronary artery disease involving native coronary artery of native heart   Chronic combined systolic and diastolic congestive heart failure (HCC)   Primary adenocarcinoma of upper lobe of left lung (HCC)   CKD stage 3b, GFR 30-44 ml/min (HCC)  Resolved Problems:   * No resolved hospital problems. *  Hospital Course: Erin  Erin [name] is a 63 y.o. female with medical history significant for lung cancer status post left upper lobectomy, CAD, ischemic cardiomyopathy, CKD 3B, and anxiety, presenting with lightheadedness, generalized weakness, and near syncope.   Patient reports that she was experiencing some nausea and loss of appetite yesterday but was otherwise feeling well and was able to go about her usual activities.  She got out of bed last night to use the bathroom and became acutely lightheaded with severe generalized weakness and feeling as though she was about to lose consciousness.  She denies any chest pain or palpitations associated with this and denies focal numbness or weakness. She denies recent fever or chills. She felt to lightheaded and weak to get up on her own and EMS was called.  She was found to have SBP in the 80s and was given 500 mL of IV fluids prior to arrival in the ED.    After IVF in the ED, she feels well when supine but continues to become lightheaded when sitting up.   ED Course: Afebrile and saturating well  on room air with normal HR and SBP 69 and greater.  EKG demonstrates a sinus rhythm and chest x-ray is negative for acute cardiopulmonary disease.  There are no acute findings on head CT or CTA chest.  Labs are most notable for creatinine 1.76, normal WBC, normal troponin x 2, D-dimer 0.63, and BNP 123.     Assessment/Plan    1. Near-syncope; hypotension  -Positive for orthostatic hypotension -Blood pressure remained low, -Starting midodrine  5 mg p.o. 3 times daily - Awake alert oriented  - POA:  near syncope upon standing and is found to be hypotensive  - CTA is negative for PE and there is no apparent bleeding, infection, or anaphylaxis    -  Continue cardiac monitoring: No signs of dysrhythmia   - 2D Echocardiogram,    -Reducing metoprolol  from 25 to 12.5 mg-holding Lasix  -S/p gentle IV fluid hydration   2. Chronic combined systolic and diastolic CHF  - Appears compensated  Due to hypotension: Reducing metoprolol  from 25 to 12.5 mg p.o. twice daily, holding Lasix   -  monitor weight and I/Os     3. CAD  - No anginal symptoms - Continue ASA, Plavix , and Lipitor     4. CKD 3B  - Renally-dose medications--creatinine seems to be at baseline   5. Lung cancer  - Followed by Dr. Sherrod, currently under observation, no acute findings noted on CT chest in ED     6. Anxiety  - Continue Zoloft  and Buspar    Assessment and Plan: No notes have  been filed under this hospital service. Service: Hospitalist         Procedures performed: 2D echocardiogram Disposition: Home Diet recommendation:  Discharge Diet Orders (From admission, onward)     Start     Ordered   01/11/24 0000  Diet - low sodium heart healthy        01/11/24 1021           Regular diet DISCHARGE MEDICATION: Allergies as of 01/11/2024       Reactions   Alpha-gal Shortness Of Breath, Nausea And Vomiting, Dermatitis   Other Shortness Of Breath, Diarrhea, Nausea And Vomiting, Nausea Only   All- red  meats/dairy Medications in Capsule form    Beef-derived Drug Products Diarrhea, Nausea And Vomiting   Chantix  [varenicline ] Nausea Only   Pork-derived Products Diarrhea, Nausea And Vomiting   Reglan  [metoclopramide ]    Involuntary movements   Tape Other (See Comments)   PEELS SKIN OFF  (PAPER TAPE IS FINE)        Medication List     PAUSE taking these medications    furosemide  20 MG tablet Wait to take this until your doctor or other care provider tells you to start again. Commonly known as: LASIX  Take 2 tablets (40 mg total) by mouth daily.       STOP taking these medications    MAGnesium -Oxide 400 (240 Mg) MG tablet Generic drug: magnesium  oxide   spironolactone  25 MG tablet Commonly known as: ALDACTONE        TAKE these medications    aspirin  EC 81 MG tablet Take 1 tablet (81 mg total) by mouth every evening. Okay to restart on 2/29/2025   atorvastatin  40 MG tablet Commonly known as: Lipitor  Take 1 tablet (40 mg total) by mouth daily. What changed:  medication strength how much to take   busPIRone  5 MG tablet Commonly known as: BUSPAR  Take 1 tablet (5 mg total) by mouth 3 (three) times daily before meals. What changed:  medication strength how much to take   clopidogrel  75 MG tablet Commonly known as: PLAVIX  Take 1 tablet by mouth once daily   EPINEPHrine  0.3 mg/0.3 mL Soaj injection Commonly known as: EpiPen  2-Pak Inject 0.3 mg into the muscle as needed for anaphylaxis.   ezetimibe  10 MG tablet Commonly known as: ZETIA  Take 1 tablet (10 mg total) by mouth daily.   Farxiga  10 MG Tabs tablet Generic drug: dapagliflozin  propanediol TAKE 1 TABLET BY MOUTH ONCE DAILY BEFORE BREAKFAST   fenofibrate  145 MG tablet Commonly known as: TRICOR  Take 1 tablet by mouth once daily   gabapentin  300 MG capsule Commonly known as: NEURONTIN  Take 1 capsule by mouth at bedtime   influenza vac split trivalent PF 0.5 ML injection Commonly known as:  FLUZONE  Inject 0.5 mLs into the muscle tomorrow at 10 am for 1 dose.   linaclotide  145 MCG Caps capsule Commonly known as: Linzess  Take 1 capsule (145 mcg total) by mouth daily before breakfast.   metoprolol  succinate 25 MG 24 hr tablet Commonly known as: Toprol  XL Take 0.5 tablets (12.5 mg total) by mouth at bedtime. What changed: how much to take   midodrine  5 MG tablet Commonly known as: PROAMATINE  Take 1 tablet (5 mg total) by mouth 3 (three) times daily with meals.   multivitamin with minerals tablet Take 1 tablet by mouth daily.   nicotine  21 mg/24hr patch Commonly known as: NICODERM CQ  - dosed in mg/24 hours Place 21 mg onto the skin daily.  nitroGLYCERIN  0.4 MG SL tablet Commonly known as: NITROSTAT  Place 1 tablet (0.4 mg total) under the tongue every 5 (five) minutes x 3 doses as needed for chest pain (if no relief after 2nd dose, proceed to the ED for an evaluation or call 911).   ondansetron  8 MG tablet Commonly known as: ZOFRAN  Take 1 tablet (8 mg total) by mouth every 8 (eight) hours as needed for nausea or vomiting.   pantoprazole  40 MG tablet Commonly known as: PROTONIX  TAKE 1 TABLET BY MOUTH TWICE DAILY BEFORE A MEAL   potassium chloride  SA 20 MEQ tablet Commonly known as: KLOR-CON  M Take 1 tablet (20 mEq total) by mouth daily. What changed: how much to take   Repatha  SureClick 140 MG/ML Soaj Generic drug: Evolocumab  Inject 140 mg into the skin every 14 (fourteen) days.   rOPINIRole  0.5 MG tablet Commonly known as: REQUIP  Take 0.5 mg by mouth at bedtime.   sertraline  25 MG tablet Commonly known as: ZOLOFT  Take 1 tablet (25 mg total) by mouth daily. Start taking on: January 12, 2024 What changed:  medication strength how much to take   traZODone  50 MG tablet Commonly known as: DESYREL  TAKE 1 TABLET BY MOUTH AT BEDTIME        Follow-up Information     Schedule an appointment as soon as possible for a visit  with Connect with your  PCP/Specialist as discussed.   Contact information: [email or web address] Call our physician referral line at [phone].               Discharge Exam: Filed Weights   01/09/24 2308 01/10/24 0522 01/11/24 0434  Weight: 50 kg 50.4 kg 51.9 kg        General:  AAO x 3,  cooperative, no distress;   HEENT:  Normocephalic, PERRL, otherwise with in Normal limits   Neuro:  CNII-XII intact. , normal motor and sensation, reflexes intact   Lungs:   Clear to auscultation BL, Respirations unlabored,  No wheezes / crackles  Cardio:    S1/S2, RRR, No murmure, No Rubs or Gallops   Abdomen:  Soft, non-tender, bowel sounds active all four quadrants, no guarding or peritoneal signs.  Muscular  skeletal:  Limited exam -global generalized weaknesses - in bed, able to move all 4 extremities,   2+ pulses,  symmetric, No pitting edema  Skin:  Dry, warm to touch, negative for any Rashes,  Wounds: Please see nursing documentation          Condition at discharge: fair  The results of significant diagnostics from this hospitalization (including imaging, microbiology, ancillary and laboratory) are listed below for reference.   Imaging Studies: CT Angio Chest PE W and/or Wo Contrast Result Date: 01/10/2024 CLINICAL DATA:  High probability pulmonary embolism, dizziness, chest pain EXAM: CT ANGIOGRAPHY CHEST WITH CONTRAST TECHNIQUE: Multidetector CT imaging of the chest was performed using the standard protocol during bolus administration of intravenous contrast. Multiplanar CT image reconstructions and MIPs were obtained to evaluate the vascular anatomy. RADIATION DOSE REDUCTION: This exam was performed according to the departmental dose-optimization program which includes automated exposure control, adjustment of the mA and/or kV according to patient size and/or use of iterative reconstruction technique. CONTRAST:  64mL OMNIPAQUE  IOHEXOL  350 MG/ML SOLN COMPARISON:  04/28/2023  FINDINGS: Cardiovascular: Mild coronary artery calcification. Cardiac size is mildly enlarged with left ventricular dilation noted. Left subclavian single lead pacemaker defibrillator is seen with its lead tip extending through the wall of the right ventricular apex and terminal  epicardial fat. Adequate opacification of the pulmonary arterial tree. No intraluminal filling defect identified to suggest acute pulmonary embolism. Central pulmonary arteries are of normal caliber. Small, stable pericardial effusion. Mild atherosclerotic calcification within the thoracic aorta. No aortic aneurysm. Mediastinum/Nodes: No enlarged mediastinal, hilar, or axillary lymph nodes. Thyroid  gland, trachea, and esophagus demonstrate no significant findings. Lungs/Pleura: Surgical changes of left upper lobectomy are identified. Left-sided volume loss noted. Residual lungs are clear. No pneumothorax or pleural effusion. Upper Abdomen: No acute abnormality. Musculoskeletal: No chest wall abnormality. No acute or significant osseous findings. Review of the MIP images confirms the above findings. IMPRESSION: 1. No pulmonary embolism. No acute intrathoracic pathology identified. 2. Mild coronary artery calcification. Mild cardiomegaly with left ventricular dilation. 3. Left subclavian single lead pacemaker defibrillator with its lead tip extending through the wall of the right ventricular apex and terminating within the epicardial fat. This is unchanged from prior examination. 4. Surgical changes of left upper lobectomy. 5. Aortic atherosclerosis. Aortic Atherosclerosis (ICD10-I70.0). Electronically Signed   By: Dorethia Molt M.D.   On: 01/10/2024 03:36   DG Chest Portable 1 View Result Date: 01/10/2024 EXAM: 1 VIEW XRAY OF THE CHEST 01/10/2024 12:02:00 AM COMPARISON: 10/17/2023 CLINICAL HISTORY: SOB. SHOB, hurts upon deep breath in, nausea, dizziness, low BP (97/60), sweating FINDINGS: LUNGS AND PLEURA: No focal pulmonary opacity. No  pulmonary edema. No pleural effusion. No pneumothorax. HEART AND MEDIASTINUM: No acute abnormality of the cardiac and mediastinal silhouettes. Left chest wall single lead pacemaker in place. BONES AND SOFT TISSUES: No acute osseous abnormality. Left shoulder arthroplasty noted. Right upper quadrant surgical clips noted. IMPRESSION: 1. No acute process. Electronically signed by: Franky Stanford MD 01/10/2024 12:22 AM EDT RP Workstation: [id number]   CT HEAD WO CONTRAST ( ) Result Date: 01/10/2024 EXAM: CT HEAD WITHOUT CONTRAST 01/10/2024 12:17:20 AM TECHNIQUE: CT of the head was performed without the administration of intravenous contrast. Automated exposure control, iterative reconstruction, and/or weight based adjustment of the mA/kV was utilized to reduce the radiation dose to as low as reasonably achievable. COMPARISON: 05/15/2021 CLINICAL HISTORY: Headache, neuro deficit. Pt c/o dizziness, dry heaving, and chest pain. FINDINGS: BRAIN AND VENTRICLES: No acute hemorrhage. No evidence of acute infarct. No hydrocephalus. No extra-axial collection. No mass effect or midline shift. ORBITS: No acute abnormality. SINUSES: No acute abnormality. SOFT TISSUES AND SKULL: No acute soft tissue abnormality. No skull fracture. VASCULATURE: Stent within the proximal left MCA. IMPRESSION: 1. No acute intracranial abnormality. Electronically signed by: Franky Stanford MD 01/10/2024 12:21 AM EDT RP Workstation: [id number]    Microbiology: Results for orders placed or performed during the hospital encounter of 08/15/23  Surgical pcr screen     Status: None   Collection Time: 08/15/23  9:54 AM   Specimen: Nasal Mucosa; Nasal Swab  Result Value Ref Range Status   MRSA, PCR NEGATIVE NEGATIVE Final   Staphylococcus aureus NEGATIVE NEGATIVE Final    Comment: (NOTE) The Xpert SA Assay (FDA approved for NASAL specimens in patients 51 years of age and older), is one component of a comprehensive surveillance program. It is not  intended to diagnose infection nor to guide or monitor treatment. Performed at [facility], [address] [place], [place] [address]    *Note: Due to a large number of results and/or encounters for the requested time period, some results have not been displayed. A complete set of results can be found in Results Review.    Labs: CBC: Recent Labs  Lab 01/10/24 0024 01/10/24  9441 01/11/24 0425  WBC 10.3 7.4 5.7  NEUTROABS 8.1*  --   --   HGB 12.8 10.9* 11.0*  HCT 38.9 33.3* 34.4*  MCV 80.2 80.0 81.7  PLT 427* 364 347   Basic Metabolic Panel: Recent Labs  Lab 01/10/24 0024 01/10/24 0558 01/11/24 0425  NA 137 137 138  K 4.2 4.7 4.0  CL 103 106 108  CO2 24 25 24   GLUCOSE 117* 95 81  BUN 25* 23 22  CREATININE 1.76* 1.71* 1.45*  CALCIUM  9.7 9.1 9.2   Liver Function Tests: Recent Labs  Lab 01/10/24 0024  AST 24  ALT 13  ALKPHOS 43  BILITOT 0.5  PROT 7.1  ALBUMIN  4.1   CBG: Recent Labs  Lab 01/11/24 0425  GLUCAP 90    Discharge time spent: greater than 30 minutes.  Signed: Adriana DELENA Grams, MD [facility] 01/11/2024

## 2024-01-11 NOTE — Care Management Obs Status (Signed)
 MEDICARE OBSERVATION STATUS NOTIFICATION   Patient Details  Name: Erin  EDDY [name] MRN: [id number] Date of Birth: 1960-12-09   Medicare Observation Status Notification Given:  Yes    Duwaine LITTIE [name] 01/11/2024, 12:20 PM

## 2024-01-11 NOTE — Progress Notes (Signed)
*  PRELIMINARY RESULTS* Echocardiogram 2D Echocardiogram has been performed.  Erin [name] 01/11/2024, 9:16 AM

## 2024-01-15 ENCOUNTER — Ambulatory Visit: Attending: Cardiology

## 2024-01-15 DIAGNOSIS — I5042 Chronic combined systolic (congestive) and diastolic (congestive) heart failure: Secondary | ICD-10-CM

## 2024-01-15 DIAGNOSIS — Z95810 Presence of automatic (implantable) cardiac defibrillator: Secondary | ICD-10-CM

## 2024-01-16 NOTE — Progress Notes (Signed)
 Restart Lasix  and spironolactone  at prior dosing.    Emer/Erin [name], needs APP appointment please.

## 2024-01-16 NOTE — Progress Notes (Unsigned)
 EPIC Encounter for ICM Monitoring  Patient Name: Erin [name] is a 63 y.o. female Date: 01/16/2024 Primary Care Physican: Patient, No Pcp Per Primary Cardiologist: Rolan Electrophysiologist: Inocencio 08/11/2021 Office Weight: 121 lbs. 03/25/2022 Office Weight 125-127 lbs 03/13/2023 Weight: 110 lbs 03/24/2023 Weight: 115 lbs 05/26/2023 Weight: 105 lbs 07/10/2023 Weight: 103 lbs 09/15/2023 Office Weight: 110 lbs 10/04/2023 Weight: 107 lbs 12/06/2023 Home Weight: 107-110 lbs 01/16/2024 Home Weight: 110 lbs                                                          Spoke with patient and heart failure questions reviewed.  Transmission results reviewed.  Pt asymptomatic for fluid accumulation.  She is SOB on exertion but not at rest.    Hospitalized 9/2-9/4 for low BP and near syncope when EMS picked her.          She has been monitoring BP at home and no low Bps since hospital discharge.     Diet:  Not addressed    Optivol thoracic impedance suggesting possible fluid accumulation starting 8/24, returned to baseline 9/3 and possible fluid accumulation returned 9/4.SABRA   Prescribed: Furosemide  20 mg Take 2 tablets (40 mg total) by mouth daily.  She was taking 20 mg twice a day prior to 9/2 hospitalization.    ON hold since 9/4 discharge due to Low BP Potassium 20 mEq take 1 tablets (20 mEq total) by mouth daily Spironolactone  25 mg take 0.5 tablet (12.5 mg total) by mouth at bedtime  ON hold since 9/4 discharge due to Low BP   Labs: 01/11/2024 Creatinine 1.45, BUN 22, Potassium 4.0, Sodium 138, GFR 41  01/10/2024 Creatinine 1.71, BUN 23, Potassium 4.7, Sodium 137, GFR 34  12/12/2023 Creatinine 1.89, BUN 17, Potassium 4.3, Sodium 140, GFR 30 11/23/2023 Creatinine 2.10, BUN 24, Potassium 4.4, Sodium 139, GFR 26  11/14/2023 Creatinine 1.90, BUN 16, Potassium 3.5, Sodium 138, GFR 30 (Lasix  decreased to 20 mg day in response to results) 11/02/2023 Creatinine 1.54, BUN 18, Potassium 3.8, Sodium 141  A  complete set of results can be found in Results Review.   Recommendations:  Lasix  and Spironolactone  on hold as instructed at 9/4 discharge (hospitalized for low BP and near syncope 9/2-9/4).  No low BP's since Hospital discharge.   Copy sent to Dr Rolan for review and recommendations.     Follow-up plan: ICM clinic phone appointment on 01/22/2024 to recheck fluid levels.   91 day device clinic remote transmission 02/14/2024.     EP/Cardiology Office Visits:   Recall 03/15/2024 with HF Clinic.  Recall 02/18/2024 with [name], PA.    Advised to call HF clinic for post hospital appointment.   Copy of ICM check sent to Dr. Inocencio.   3 month ICM trend: 01/15/2024.    12-14 Month ICM trend:     [name], RN 01/16/2024 4:57 PM

## 2024-01-17 ENCOUNTER — Telehealth (HOSPITAL_COMMUNITY): Payer: Self-pay

## 2024-01-17 MED ORDER — SPIRONOLACTONE 25 MG PO TABS: 12.5000 mg | ORAL_TABLET | Freq: Every day | ORAL | 3 refills | Status: DC

## 2024-01-17 NOTE — Telephone Encounter (Signed)
 Called patient informed to restart lasix  and spironolactone  at prior dose and also have her scheduled for next week.  Med list updated

## 2024-01-19 NOTE — Progress Notes (Signed)
 [facility]ED [facility] CLINIC NOTE   PCP: Patient, No Pcp Per HF Cardiology: Dr. Rolan  Jenesys  Erin [name] is a 63 y.o. with history of CAD, ischemic cardiomyopathy, and PAD was referred by Dr. Milbert for evaluation of CHF.  She had NSTEMI in 11/17 with PCI to prox/mid LCx and RCA.  Subsequently, has developed ischemic cardiomyopathy.  Most recent echo in 1/21 showed EF 30-35%.  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   RHC/LHC done with exertional chest heaviness and dyspnea in 4/21 showed normal filling pressures, preserved cardiac output, and nonobstructive mild CAD.  ABIs were normal in 4/21.   In 8/21, she had syncope thought to be related to orthostasis from low BP in the setting of cardiac medication titration.  Entresto  was decreased.   She was in the ER in 11/21 with chest pain.  Troponin and COVID-19 negative. Echo in 11/21 showed EF 30-35%, normal RV.   She was hospitalized in 6/22 with left-sided weakness/numbness.  No evidence for acute CVA, ?TIA.    Echo in 11/22 showed EF 30-35% with mild LV dilation, normal RV, mild-moderate MR, IVC normal.   Echo 2/24 showed EF remains 30-35% with normal RV.   Patient had left shoulder replacement in 11/24.   In 2/25, she was diagnosed with Stage Ia LUL lung adenocarcinoma.    Echo in 3/25 showed EF 35-40%, RV normal, IVC normal. Cardolite in 4/25 showed inferior/inferolateral infarction with no ischemia.   S/p L upper lobectomy 4/25, 5 benign lymph nodes neg for CA.   Admitted 9/25 for near syncope. SBP in 80's. GDMT held, given IVF and midodrine  started. Echo showed EF 40%, G1DD, normal RV.  Today she returns for post hospital follow up. Overall feeling fair. BP remains in 80-90s at home. Feels dizzy, occurs randomly during the day, not associated with position changes. She has SOB walking short distances on flat ground. She attributes her SOB to lung issues. Denies palpitations, abnormal bleeding,  CP, edema, or PND/Orthopnea. Appetite ok. Weight at home 110 pounds. Taking all medications.  Works at [employer] but dizziness has prevented her from work.   Medtronic device interrogation (personally reviewed): OptiVol up but thoracic impedence near baseline, 100% VS, no AF, 2.7 hr/day activity.  ECG (personally reviewed):  NSR 1 PVC, 80 bpm  Labs (7/25): LDL 37 Labs (9/25): K 4.0, creatinine 1.45  PMH: 1. CAD: NSTEMI with DES to proximal and mid LCx in 11/17, staged DES x 2 to RCA later in 11/17.  - Cardiolite  (10/20): EF < 30%, large inferior and inferolateral MI with mild peri-infarct ischemia.  - LHC (4/21): Patent stents, nonobstructive CAD.  - Lexiscan  Cardiolite  (4/25): EF 35%, inferior and inferolateral infarction with no ischemia.  2. Chronic systolic CHF: Ischemic cardiomyopathy. Medtronic ICD.  - Echo (10/20): EF 35-40%, lateral WMAs, normal RV. - Echo (1/21): EF 30-35%, mild LVH, normal RV - RHC (4/21): mean RA 5, PA 29/3, mean PCWP 12, CI 3.04 - Echo (11/21): EF 30-35%, normal RV.  - Echo (11/22): EF 30-35% with mild LV dilation, normal RV, mild-moderate MR, IVC normal.  - Echo (2/24): EF 30-35%, normal RV.  - Echo (3/25): EF 35-40%, RV normal, IVC normal - Echo (9/25): EF 40%, G1DD, normal RV 3. 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  4. Carotid stenosis: Carotid dopplers (10/20) with 60-79% LICA stenosis.  - Carotid dopplers (6/21): 40-59% BICA stenosis.  - Carotid dopplers (1/22): 40-59% RICA, 60-79% LICA.  -  Carotid dopplers (4/23): 40-59% LICA - Carotid dopplers (12/23): 40-59% LICA - Carotid dopplers (12/24): 40-59% LICA stenosis 5. Prior smoker.  6. PAD: ABIs (4/21) Normal.  - Peripheral artery dopplers (12/21): bilateral plaque without focal stenosis.  7. Left shoulder replacement 11/24 8. Lung cancer: NSCLC (adenoCA), stage Ia, diagnosed in 2/25.   Social History   Socioeconomic History   Marital status: Married    Spouse  name: Not on file   Number of children: Not on file   Years of education: Not on file   Highest education level: Associate degree: occupational, Scientist, product/process development, or vocational program  Occupational History   Occupation: CNA  Tobacco Use   Smoking status: Former    Current packs/day: 1.50    Average packs/day: 1.5 packs/day for 40.0 years (60.0 ttl pk-yrs)    Types: Cigarettes   Smokeless tobacco: Never   Tobacco comments:    Smokes off and on 06/14/23 Sonny Lecher, CMA     No cigarettes since Thursday this week>> 07/06/2023  Vaping Use   Vaping status: Never Used  Substance and Sexual Activity   Alcohol use: Not Currently    Comment: occasionally   Drug use: Not Currently    Types: Marijuana   Sexual activity: Not Currently    Birth control/protection: Surgical    Comment: hyst  Other Topics Concern   Not on file  Social History Narrative   Lives with husband in [place] in a one story home with a basement.  Has 4 children.  Works as a Lawyer.  Education: CNA school.    Social Drivers of Corporate investment banker Strain: Low Risk  (10/18/2023)   Overall Financial Resource Strain (CARDIA)    Difficulty of Paying Living Expenses: Not hard at all  Food Insecurity: No Food Insecurity (01/10/2024)   Hunger Vital Sign    Worried About Running Out of Food in the Last Year: Never true    Ran Out of Food in the Last Year: Never true  Transportation Needs: No Transportation Needs (01/10/2024)   PRAPARE - Administrator, Civil Service (Medical): No    Lack of Transportation (Non-Medical): No  Physical Activity: Sufficiently Active (10/18/2023)   Exercise Vital Sign    Days of Exercise per Week: 3 days    Minutes of Exercise per Session: 60 min  Stress: Stress Concern Present (10/18/2023)   [employer] of Occupational Health - Occupational Stress Questionnaire    Feeling of Stress : Very much  Social Connections: Socially Isolated (01/10/2024)   Social Connection and Isolation  Panel    Frequency of Communication with Friends and Family: Once a week    Frequency of Social Gatherings with Friends and Family: Never    Attends Religious Services: Never    Database administrator or Organizations: No    Attends Banker Meetings: Never    Marital Status: Married  Catering manager Violence: Not At Risk (01/10/2024)   Humiliation, Afraid, Rape, and Kick questionnaire    Fear of Current or Ex-Partner: No    Emotionally Abused: No    Physically Abused: No    Sexually Abused: No   Family History  Problem Relation Age of Onset   Stroke Mother    Hypertension Mother    Diabetes Mother    Heart attack Mother    Heart attack Father    Diabetes Father    Hypertension Father    CAD Father    Colon polyps Father 53  pre-cancerous    Stroke Father    Dementia Father    Hyperlipidemia Father    Arthritis Father    COPD Father        smoked and worked IT sales professional disease Father    Breast cancer Maternal Grandmother    Diabetes Maternal Grandmother    Cancer Maternal Grandfather        Tongue and esophageal   Cancer Paternal Grandmother    Anxiety disorder Daughter    Depression Daughter    Anxiety disorder Daughter    Heart failure Other    Colon cancer Neg Hx    ROS: All systems reviewed and negative except as per HPI.   Current Outpatient Medications  Medication Sig Dispense Refill   aspirin  EC 81 MG tablet Take 1 tablet (81 mg total) by mouth every evening. Okay to restart on 2/29/2025     atorvastatin  (LIPITOR ) 40 MG tablet Take 1 tablet (40 mg total) by mouth daily. 30 tablet 11   busPIRone  (BUSPAR ) 5 MG tablet Take 1 tablet (5 mg total) by mouth 3 (three) times daily before meals. 90 tablet 0   clopidogrel  (PLAVIX ) 75 MG tablet Take 1 tablet by mouth once daily 90 tablet 3   EPINEPHrine  (EPIPEN  2-PAK) 0.3 mg/0.3 mL IJ SOAJ injection Inject 0.3 mg into the muscle as needed for anaphylaxis. 2 each 2   Evolocumab  (REPATHA   SURECLICK) 140 MG/ML SOAJ Inject 140 mg into the skin every 14 (fourteen) days. 6 mL 3   ezetimibe  (ZETIA ) 10 MG tablet Take 1 tablet (10 mg total) by mouth daily. 90 tablet 3   FARXIGA  10 MG TABS tablet TAKE 1 TABLET BY MOUTH ONCE DAILY BEFORE BREAKFAST 90 tablet 0   fenofibrate  (TRICOR ) 145 MG tablet Take 1 tablet by mouth once daily 30 tablet 11   furosemide  (LASIX ) 20 MG tablet Take 2 tablets (40 mg total) by mouth daily. 90 tablet 3   gabapentin  (NEURONTIN ) 300 MG capsule Take 1 capsule by mouth at bedtime 30 capsule 0   linaclotide  (LINZESS ) 145 MCG CAPS capsule Take 1 capsule (145 mcg total) by mouth daily before breakfast. 30 capsule 11   metoprolol  succinate (TOPROL  XL) 25 MG 24 hr tablet Take 0.5 tablets (12.5 mg total) by mouth at bedtime. 90 tablet 3   midodrine  (PROAMATINE ) 5 MG tablet Take 1 tablet (5 mg total) by mouth 3 (three) times daily with meals. 90 tablet 0   Multiple Vitamins-Minerals (MULTIVITAMIN WITH MINERALS) tablet Take 1 tablet by mouth daily.     nicotine  (NICODERM CQ  - DOSED IN MG/24 HOURS) 21 mg/24hr patch Place 21 mg onto the skin daily.     nitroGLYCERIN  (NITROSTAT ) 0.4 MG SL tablet Place 1 tablet (0.4 mg total) under the tongue every 5 (five) minutes x 3 doses as needed for chest pain (if no relief after 2nd dose, proceed to the ED for an evaluation or call 911). 25 tablet 2   ondansetron  (ZOFRAN ) 8 MG tablet Take 1 tablet (8 mg total) by mouth every 8 (eight) hours as needed for nausea or vomiting. 90 tablet 3   pantoprazole  (PROTONIX ) 40 MG tablet TAKE 1 TABLET BY MOUTH TWICE DAILY BEFORE A MEAL 60 tablet 5   potassium chloride  SA (KLOR-CON  M) 20 MEQ tablet Take 1 tablet (20 mEq total) by mouth daily. 90 tablet 3   rOPINIRole  (REQUIP ) 0.5 MG tablet Take 0.5 mg by mouth at bedtime.   0   sertraline  (ZOLOFT ) 25 MG  tablet Take 1 tablet (25 mg total) by mouth daily. 30 tablet 0   spironolactone  (ALDACTONE ) 25 MG tablet Take 0.5 tablets (12.5 mg total) by mouth daily.  45 tablet 3   traZODone  (DESYREL ) 50 MG tablet TAKE 1 TABLET BY MOUTH AT BEDTIME 90 tablet 0   No current facility-administered medications for this encounter.   Wt Readings from Last 3 Encounters:  01/24/24 50.3 kg (111 lb)  01/11/24 51.9 kg (114 lb 6.7 oz)  12/12/23 50.1 kg (110 lb 6.4 oz)   BP 104/74   Pulse 76   Ht 5' (1.524 m)   Wt 50.3 kg (111 lb)   SpO2 100%   BMI 21.68 kg/m   Orthostatic VS today 01/24/24 Lying: 118/78 Sitting: 102/70 Standing: 88/72  Physical Exam General:  NAD. No resp difficulty, walked into clinic, thin HEENT: Normal Neck: Supple. No JVD. Cor: Regular rate & rhythm. No rubs, gallops or murmurs. Lungs: Clear Abdomen: Soft, nontender, nondistended.  Extremities: No cyanosis, clubbing, rash, edema Neuro: Alert & oriented x 3, moves all 4 extremities w/o difficulty. Affect pleasant.  Assessment/Plan: 1. CAD: S/p NSTEMI in 11/17 with DEX to LCx and DES x 2 to RCA.  Cardiolite  in 10/20 with EF <30%, inferior/inferolateral infarct with peri-infarct ischemia. LHC in 4/21 showed nonobstructive mild CAD.  Cardiolite  in 4/25 was similar with inferior and inferolateral infarction, no ischemia.  No chest pain.  - Continue atorvastatin  + fenofibrate  + Repatha . Good lipids 11/2023  - She has been on ASA 81 and Plavix  75 mg daily.   2. Chronic systolic CHF: Ischemic cardiomyopathy.  RHC in 4/21 with normal filling pressures and preserved cardiac output.  Echo 2/24 showed stable EF 30-35%.  Echo in 3/25 showed EF 35-40%, normal RV.  Echo 9/25 showed EF 40%, G1DD, normal RV. NYHA class II-early III, main symptom is orthostasis. GDMT limited by hypotension & orthostasis. She is not volume overloaded by exam, device shows mild volume overload. - With + orthostatics today, will stop spironolactone . - Place compression hose. May need abd binder. - Continue midodrine  5 mg tid - Continue Lasix  40 mg daily + 20 KCL daily. - Continue Farixga 10 mg daily.   - Continue  Toprol  XL 12.5 mg at bedtime.  - Off losartan  with low BP and need for midodrine  - Candidate for CCM. Will have her assessed for this by EP after lung cancer treatment.  3. Carotid stenosis: Carotid dopplers 12/24 showed 40-59% LICA stenosis - Repeat 04/2024 4. Smoking: She is staying off cigarettes since 02/2023. Congratulated. 5. 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  - Continue Plavix  per Dr. Dolphus (on hold for lung cancer surgery).  6. Lung cancer: Non-small cell lung CA, adenocarcinoma, diagnosed 2/25. S/p robot assisted thoracoscopy and lobectomy by Dr. Shyrl 4/25. 5/5 lymph nodes negative for cancer. - She follows with Dr. Sherrod   Follow up in 2 weeks with APP (bring BP log and cuff) and 3 months with Dr. Rolan Raisin [facility] FNP-BC 01/24/2024

## 2024-01-22 ENCOUNTER — Ambulatory Visit: Attending: Cardiology

## 2024-01-22 DIAGNOSIS — I5042 Chronic combined systolic (congestive) and diastolic (congestive) heart failure: Secondary | ICD-10-CM

## 2024-01-22 DIAGNOSIS — Z95810 Presence of automatic (implantable) cardiac defibrillator: Secondary | ICD-10-CM

## 2024-01-23 ENCOUNTER — Telehealth (HOSPITAL_COMMUNITY): Payer: Self-pay

## 2024-01-23 NOTE — Telephone Encounter (Signed)
 Called and spoke to pt's Daughter Ellouise to confirm/remind patient of their appointment at the [facility] on 01/24/24.   Appointment:   [x] Confirmed  [] Left mess   [] No answer/No voice mail  [] VM Full/unable to leave message  [] Phone not in service  Patient reminded to bring all medications and/or complete list.  Confirmed patient has transportation. Gave directions, instructed to utilize valet parking.

## 2024-01-23 NOTE — Progress Notes (Signed)
 EPIC Encounter for ICM Monitoring  Patient Name: Erin [name] is a 63 y.o. female Date: 01/23/2024 Primary Care Physican: Patient, No Pcp Per Primary Cardiologist: Erin [name] Electrophysiologist: Erin [name] 08/11/2021 Office Weight: 121 lbs. 03/25/2022 Office Weight 125-127 lbs 03/13/2023 Weight: 110 lbs 03/24/2023 Weight: 115 lbs 05/26/2023 Weight: 105 lbs 07/10/2023 Weight: 103 lbs 09/15/2023 Office Weight: 110 lbs 10/04/2023 Weight: 107 lbs 12/06/2023 Home Weight: 107-110 lbs 01/16/2024 Home Weight: 110 lbs                                                          Spoke with patient and heart failure questions reviewed.  Transmission results reviewed.  Pt felt dizzy and felt like she was going to pass out when she was at [employer].  She stated the symptoms returned after resuming Lasix  40 mg daily and Spironolactone  on 9/10.     Diet:  Discussed diet and fluid intake.  She limits fluid intake to 64 oz and following low salt diet.     Optivol thoracic impedance suggesting fluid levels returned to normal after resuming Lasix  40 mg daily and Spironolactone .   Prescribed: Furosemide  20 mg Take 2 tablets (40 mg total) by mouth daily.   Potassium 20 mEq take 1 tablets (20 mEq total) by mouth daily Spironolactone  25 mg take 0.5 tablet (12.5 mg total) by mouth at bedtime     Labs: 01/11/2024 Creatinine 1.45, BUN 22, Potassium 4.0, Sodium 138, GFR 41  01/10/2024 Creatinine 1.71, BUN 23, Potassium 4.7, Sodium 137, GFR 34  12/12/2023 Creatinine 1.89, BUN 17, Potassium 4.3, Sodium 140, GFR 30 11/23/2023 Creatinine 2.10, BUN 24, Potassium 4.4, Sodium 139, GFR 26  11/14/2023 Creatinine 1.90, BUN 16, Potassium 3.5, Sodium 138, GFR 30 (Lasix  decreased to 20 mg day in response to results) 11/02/2023 Creatinine 1.54, BUN 18, Potassium 3.8, Sodium 141  A complete set of results can be found in Results Review.   Recommendations:  She will discuss with HF clinic symptoms related to possible Lasix  and Spironolactone .      Follow-up plan: ICM clinic phone appointment on 02/05/2024 to recheck fluid levels.   91 day device clinic remote transmission 02/14/2024.     EP/Cardiology Office Visits:   01/24/2024 with HF Clinic.  Recall 02/18/2024 with Erin Passey, PA.    Copy of ICM check sent to Dr. Inocencio.   3 month ICM trend: 01/22/2024.    12-14 Month ICM trend:     Erin GORMAN Garner, RN 01/23/2024 1:38 PM

## 2024-01-24 ENCOUNTER — Ambulatory Visit (HOSPITAL_COMMUNITY)
Admission: RE | Admit: 2024-01-24 | Discharge: 2024-01-24 | Disposition: A | Discharge status: Home / Self Care | Source: Ambulatory Visit | Attending: Family Medicine | Admitting: Family Medicine

## 2024-01-24 ENCOUNTER — Encounter (HOSPITAL_COMMUNITY): Payer: Self-pay

## 2024-01-24 ENCOUNTER — Ambulatory Visit (HOSPITAL_COMMUNITY): Payer: Self-pay | Admitting: Family Medicine

## 2024-01-24 VITALS — BP 102/70 | HR 76 | Ht 60.0 in | Wt 111.0 lb

## 2024-01-24 DIAGNOSIS — I6529 Occlusion and stenosis of unspecified carotid artery: Secondary | ICD-10-CM | POA: Diagnosis not present

## 2024-01-24 DIAGNOSIS — I671 Cerebral aneurysm, nonruptured: Secondary | ICD-10-CM | POA: Diagnosis not present

## 2024-01-24 DIAGNOSIS — Z955 Presence of coronary angioplasty implant and graft: Secondary | ICD-10-CM | POA: Insufficient documentation

## 2024-01-24 DIAGNOSIS — C3490 Malignant neoplasm of unspecified part of unspecified bronchus or lung: Secondary | ICD-10-CM

## 2024-01-24 DIAGNOSIS — I255 Ischemic cardiomyopathy: Secondary | ICD-10-CM | POA: Diagnosis not present

## 2024-01-24 DIAGNOSIS — I2510 Atherosclerotic heart disease of native coronary artery without angina pectoris: Secondary | ICD-10-CM | POA: Insufficient documentation

## 2024-01-24 DIAGNOSIS — Z7982 Long term (current) use of aspirin: Secondary | ICD-10-CM | POA: Insufficient documentation

## 2024-01-24 DIAGNOSIS — C3412 Malignant neoplasm of upper lobe, left bronchus or lung: Secondary | ICD-10-CM | POA: Diagnosis not present

## 2024-01-24 DIAGNOSIS — I6522 Occlusion and stenosis of left carotid artery: Secondary | ICD-10-CM | POA: Diagnosis not present

## 2024-01-24 DIAGNOSIS — I739 Peripheral vascular disease, unspecified: Secondary | ICD-10-CM | POA: Insufficient documentation

## 2024-01-24 DIAGNOSIS — Z79899 Other long term (current) drug therapy: Secondary | ICD-10-CM | POA: Diagnosis not present

## 2024-01-24 DIAGNOSIS — Z87891 Personal history of nicotine dependence: Secondary | ICD-10-CM | POA: Insufficient documentation

## 2024-01-24 DIAGNOSIS — I252 Old myocardial infarction: Secondary | ICD-10-CM | POA: Diagnosis not present

## 2024-01-24 DIAGNOSIS — I5022 Chronic systolic (congestive) heart failure: Secondary | ICD-10-CM | POA: Diagnosis not present

## 2024-01-24 DIAGNOSIS — Z7902 Long term (current) use of antithrombotics/antiplatelets: Secondary | ICD-10-CM | POA: Insufficient documentation

## 2024-01-24 LAB — BASIC METABOLIC PANEL WITH GFR
Anion gap: 13 (ref 5–15)
BUN: 19 mg/dL (ref 8–23)
CO2: 24 mmol/L (ref 22–32)
Calcium: 9.8 mg/dL (ref 8.9–10.3)
Chloride: 102 mmol/L (ref 98–111)
Creatinine, Ser: 1.59 mg/dL — ABNORMAL HIGH (ref 0.44–1.00)
GFR, Estimated: 37 mL/min — ABNORMAL LOW (ref >60)
Glucose, Bld: 94 mg/dL (ref 70–99)
Potassium: 3.7 mmol/L (ref 3.5–5.1)
Sodium: 139 mmol/L (ref 135–145)

## 2024-01-24 LAB — BRAIN NATRIURETIC PEPTIDE: B Natriuretic Peptide: 92.5 pg/mL (ref 0.0–100.0)

## 2024-01-24 NOTE — Patient Instructions (Addendum)
 Thank you for coming in today  If you had labs drawn today, any labs that are abnormal the clinic will call you No news is good news  You have been given a prescription for compression hose. And a list of places who supply them. Please wear your compression hose daily, place them on as soon as you get up in the morning and remove before you go to bed at night.   Medications: STOP Spironolactone    CHECK BLOOD PRESSURE DAILY AND LOG. CHECK BLOOD PRESSURE AT THE SAME TIME DAILY. BRING LOG AND BLOOD PRESSURE CUFF TO NEXT APPOINTMENT  Follow up appointments:  Your physician recommends that you schedule a follow-up appointment in:  2 weeks   Do the following things EVERYDAY: Weigh yourself in the morning before breakfast. Write it down and keep it in a log. Take your medicines as prescribed Eat low salt foods--Limit salt (sodium) to 2000 mg per day.  Stay as active as you can everyday Limit all fluids for the day to less than 2 liters   At the [facility], you and your health needs are our priority. As part of our continuing mission to provide you with exceptional heart care, we have created designated Provider Care Teams. These Care Teams include your primary Cardiologist (physician) and Advanced Practice Providers (APPs- Physician Assistants and Nurse Practitioners) who all work together to provide you with the care you need, when you need it.   You may see any of the following providers on your designated Care Team at your next follow up: Dr Toribio Fuel Dr Ezra Shuck Dr. Ria Gardenia Greig Lenetta, NP Caffie Shed, [place] [facility] [place], [place] Beckey Coe, NP Tinnie Redman, PharmD   Please be sure to bring in all your medications bottles to every appointment.    Thank you for choosing [facility]  If you have any questions or concerns before your next appointment please send us  a message through  [place] or call our office at [phone].    TO LEAVE A MESSAGE FOR THE NURSE SELECT OPTION 2, PLEASE LEAVE A MESSAGE INCLUDING: YOUR NAME DATE OF BIRTH CALL BACK NUMBER REASON FOR CALL**this is important as we prioritize the call backs  YOU WILL RECEIVE A CALL BACK THE SAME DAY AS LONG AS YOU CALL BEFORE 4:00 PM

## 2024-01-26 ENCOUNTER — Other Ambulatory Visit: Payer: Self-pay | Admitting: Family Medicine

## 2024-01-26 DIAGNOSIS — F410 Panic disorder [episodic paroxysmal anxiety] without agoraphobia: Secondary | ICD-10-CM

## 2024-01-26 DIAGNOSIS — G6289 Other specified polyneuropathies: Secondary | ICD-10-CM

## 2024-01-26 DIAGNOSIS — F5104 Psychophysiologic insomnia: Secondary | ICD-10-CM

## 2024-02-02 NOTE — Progress Notes (Signed)
 ADVANCED HF CLINIC NOTE   PCP: Patient, No Pcp Per HF Cardiology: Dr. Rolan  Estefany  Erin [name] is a 63 y.o. with history of CAD, ischemic cardiomyopathy, and PAD was referred by Dr. Milbert for evaluation of CHF.  She had NSTEMI in 11/17 with PCI to prox/mid LCx and RCA.  Subsequently, has developed ischemic cardiomyopathy.  Most recent echo in 1/21 showed EF 30-35%.  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   RHC/LHC done with exertional chest heaviness and dyspnea in 4/21 showed normal filling pressures, preserved cardiac output, and nonobstructive mild CAD.  ABIs were normal in 4/21.   In 8/21, she had syncope thought to be related to orthostasis from low BP in the setting of cardiac medication titration.  Entresto  was decreased.   She was in the ER in 11/21 with chest pain.  Troponin and COVID-19 negative. Echo in 11/21 showed EF 30-35%, normal RV.   She was hospitalized in 6/22 with left-sided weakness/numbness.  No evidence for acute CVA, ?TIA.    Echo in 11/22 showed EF 30-35% with mild LV dilation, normal RV, mild-moderate MR, IVC normal.   Echo 2/24 showed EF remains 30-35% with normal RV.   Patient had left shoulder replacement in 11/24.   In 2/25, she was diagnosed with Stage Ia LUL lung adenocarcinoma.    Echo in 3/25 showed EF 35-40%, RV normal, IVC normal. Cardolite in 4/25 showed inferior/inferolateral infarction with no ischemia.   S/p L upper lobectomy 4/25, 5 benign lymph nodes neg for CA.   Admitted 9/25 for near syncope. SBP in 80's. GDMT held, given IVF and midodrine  started. Echo showed EF 40%, G1DD, normal RV.  Today she returns for HF follow up. Overall feeling better. No longer dizzy since we stopped spironolactone . BP now in low 100s. She continues to work 3 days a week at [employer]. No SOB with work duties. Denies palpitations, abnormal bleeding, CP, dizziness, edema, or PND/Orthopnea. Appetite ok. Weight at home  116 pounds. Taking all medications.   Medtronic device interrogation (personally reviewed): OptiVol stable, 100% VS, no AF, 2.7 hr/day activity.  ECG (personally reviewed): none ordered today  Labs (7/25): LDL 37 Labs (9/25): K 3.7, creatinine 1.59  PMH: 1. CAD: NSTEMI with DES to proximal and mid LCx in 11/17, staged DES x 2 to RCA later in 11/17.  - Cardiolite  (10/20): EF < 30%, large inferior and inferolateral MI with mild peri-infarct ischemia.  - LHC (4/21): Patent stents, nonobstructive CAD.  - Lexiscan  Cardiolite  (4/25): EF 35%, inferior and inferolateral infarction with no ischemia.  2. Chronic systolic CHF: Ischemic cardiomyopathy. Medtronic ICD.  - Echo (10/20): EF 35-40%, lateral WMAs, normal RV. - Echo (1/21): EF 30-35%, mild LVH, normal RV - RHC (4/21): mean RA 5, PA 29/3, mean PCWP 12, CI 3.04 - Echo (11/21): EF 30-35%, normal RV.  - Echo (11/22): EF 30-35% with mild LV dilation, normal RV, mild-moderate MR, IVC normal.  - Echo (2/24): EF 30-35%, normal RV.  - Echo (3/25): EF 35-40%, RV normal, IVC normal - Echo (9/25): EF 40%, G1DD, normal RV 3. 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  4. Carotid stenosis: Carotid dopplers (10/20) with 60-79% LICA stenosis.  - Carotid dopplers (6/21): 40-59% BICA stenosis.  - Carotid dopplers (1/22): 40-59% RICA, 60-79% LICA.  - Carotid dopplers (4/23): 40-59% LICA - Carotid dopplers (12/23): 40-59% LICA - Carotid dopplers (12/24): 40-59% LICA stenosis 5. Prior smoker.  6. PAD: ABIs (  4/21) Normal.  - Peripheral artery dopplers (12/21): bilateral plaque without focal stenosis.  7. Left shoulder replacement 11/24 8. Lung cancer: NSCLC (adenoCA), stage Ia, diagnosed in 2/25.   Social History   Socioeconomic History   Marital status: Married    Spouse name: Not on file   Number of children: Not on file   Years of education: Not on file   Highest education level: Associate degree: occupational,  Scientist, product/process development, or vocational program  Occupational History   Occupation: CNA  Tobacco Use   Smoking status: Former    Current packs/day: 1.50    Average packs/day: 1.5 packs/day for 40.0 years (60.0 ttl pk-yrs)    Types: Cigarettes   Smokeless tobacco: Never   Tobacco comments:    Smokes off and on 06/14/23 Sonny Lecher, CMA     No cigarettes since Thursday this week>> 07/06/2023  Vaping Use   Vaping status: Never Used  Substance and Sexual Activity   Alcohol use: Not Currently    Comment: occasionally   Drug use: Not Currently    Types: Marijuana   Sexual activity: Not Currently    Birth control/protection: Surgical    Comment: hyst  Other Topics Concern   Not on file  Social History Narrative   Lives with husband in [place] in a one story home with a basement.  Has 4 children.  Works as a Lawyer.  Education: CNA school.    Social Drivers of Corporate investment banker Strain: Low Risk  (10/18/2023)   Overall Financial Resource Strain (CARDIA)    Difficulty of Paying Living Expenses: Not hard at all  Food Insecurity: No Food Insecurity (01/10/2024)   Hunger Vital Sign    Worried About Running Out of Food in the Last Year: Never true    Ran Out of Food in the Last Year: Never true  Transportation Needs: No Transportation Needs (01/10/2024)   PRAPARE - Administrator, Civil Service (Medical): No    Lack of Transportation (Non-Medical): No  Physical Activity: Sufficiently Active (10/18/2023)   Exercise Vital Sign    Days of Exercise per Week: 3 days    Minutes of Exercise per Session: 60 min  Stress: Stress Concern Present (10/18/2023)   [employer] of Occupational Health - Occupational Stress Questionnaire    Feeling of Stress : Very much  Social Connections: Socially Isolated (01/10/2024)   Social Connection and Isolation Panel    Frequency of Communication with Friends and Family: Once a week    Frequency of Social Gatherings with Friends and Family: Never     Attends Religious Services: Never    Database administrator or Organizations: No    Attends Banker Meetings: Never    Marital Status: Married  Catering manager Violence: Not At Risk (01/10/2024)   Humiliation, Afraid, Rape, and Kick questionnaire    Fear of Current or Ex-Partner: No    Emotionally Abused: No    Physically Abused: No    Sexually Abused: No   Family History  Problem Relation Age of Onset   Stroke Mother    Hypertension Mother    Diabetes Mother    Heart attack Mother    Heart attack Father    Diabetes Father    Hypertension Father    CAD Father    Colon polyps Father 5       pre-cancerous    Stroke Father    Dementia Father    Hyperlipidemia Father  Arthritis Father    COPD Father        smoked and worked IT sales professional disease Father    Breast cancer Maternal Grandmother    Diabetes Maternal Grandmother    Cancer Maternal Grandfather        Tongue and esophageal   Cancer Paternal Grandmother    Anxiety disorder Daughter    Depression Daughter    Anxiety disorder Daughter    Heart failure Other    Colon cancer Neg Hx    ROS: All systems reviewed and negative except as per HPI.   Current Outpatient Medications  Medication Sig Dispense Refill   aspirin  EC 81 MG tablet Take 1 tablet (81 mg total) by mouth every evening. Okay to restart on 2/29/2025     atorvastatin  (LIPITOR ) 40 MG tablet Take 1 tablet (40 mg total) by mouth daily. 30 tablet 11   busPIRone  (BUSPAR ) 5 MG tablet Take 1 tablet (5 mg total) by mouth 3 (three) times daily before meals. 90 tablet 0   clopidogrel  (PLAVIX ) 75 MG tablet Take 1 tablet by mouth once daily 90 tablet 3   EPINEPHrine  (EPIPEN  2-PAK) 0.3 mg/0.3 mL IJ SOAJ injection Inject 0.3 mg into the muscle as needed for anaphylaxis. 2 each 2   Evolocumab  (REPATHA  SURECLICK) 140 MG/ML SOAJ Inject 140 mg into the skin every 14 (fourteen) days. 6 mL 3   ezetimibe  (ZETIA ) 10 MG tablet Take 1 tablet (10 mg total)  by mouth daily. 90 tablet 3   FARXIGA  10 MG TABS tablet TAKE 1 TABLET BY MOUTH ONCE DAILY BEFORE BREAKFAST 90 tablet 0   fenofibrate  (TRICOR ) 145 MG tablet Take 1 tablet by mouth once daily 30 tablet 11   furosemide  (LASIX ) 20 MG tablet Take 2 tablets (40 mg total) by mouth daily. 90 tablet 3   gabapentin  (NEURONTIN ) 300 MG capsule Take 1 capsule by mouth at bedtime 30 capsule 0   linaclotide  (LINZESS ) 145 MCG CAPS capsule Take 1 capsule (145 mcg total) by mouth daily before breakfast. 30 capsule 11   metoprolol  succinate (TOPROL  XL) 25 MG 24 hr tablet Take 0.5 tablets (12.5 mg total) by mouth at bedtime. 90 tablet 3   midodrine  (PROAMATINE ) 5 MG tablet Take 1 tablet (5 mg total) by mouth 3 (three) times daily with meals. 90 tablet 0   Multiple Vitamins-Minerals (MULTIVITAMIN WITH MINERALS) tablet Take 1 tablet by mouth daily.     nicotine  (NICODERM CQ  - DOSED IN MG/24 HOURS) 21 mg/24hr patch Place 21 mg onto the skin daily.     nitroGLYCERIN  (NITROSTAT ) 0.4 MG SL tablet Place 1 tablet (0.4 mg total) under the tongue every 5 (five) minutes x 3 doses as needed for chest pain (if no relief after 2nd dose, proceed to the ED for an evaluation or call 911). 25 tablet 2   ondansetron  (ZOFRAN ) 8 MG tablet Take 1 tablet (8 mg total) by mouth every 8 (eight) hours as needed for nausea or vomiting. 90 tablet 3   pantoprazole  (PROTONIX ) 40 MG tablet TAKE 1 TABLET BY MOUTH TWICE DAILY BEFORE A MEAL 60 tablet 5   potassium chloride  SA (KLOR-CON  M) 20 MEQ tablet Take 1 tablet (20 mEq total) by mouth daily. 90 tablet 3   rOPINIRole  (REQUIP ) 0.5 MG tablet Take 0.5 mg by mouth at bedtime.   0   sertraline  (ZOLOFT ) 25 MG tablet Take 1 tablet (25 mg total) by mouth daily. 30 tablet 0   traZODone  (DESYREL ) 50 MG  tablet TAKE 1 TABLET BY MOUTH AT BEDTIME 90 tablet 0   spironolactone  (ALDACTONE ) 25 MG tablet Take 0.5 tablets (12.5 mg total) by mouth daily. 45 tablet 3   No current facility-administered medications for  this encounter.   Wt Readings from Last 3 Encounters:  02/07/24 53.5 kg (118 lb)  01/24/24 50.3 kg (111 lb)  01/11/24 51.9 kg (114 lb 6.7 oz)   BP 108/76   Pulse 69   Wt 53.5 kg (118 lb)   SpO2 99%   BMI 23.05 kg/m   Physical Exam General:  NAD. No resp difficulty HEENT: Normal Neck: Supple. No JVD. Cor: Regular rate & rhythm. No rubs, gallops or murmurs. Lungs: Clear Abdomen: Soft, nontender, nondistended.  Extremities: No cyanosis, clubbing, rash, edema Neuro: Alert & oriented x 3, moves all 4 extremities w/o difficulty. Affect pleasant.  Assessment/Plan: 1. CAD: S/p NSTEMI in 11/17 with DEX to LCx and DES x 2 to RCA.  Cardiolite  in 10/20 with EF <30%, inferior/inferolateral infarct with peri-infarct ischemia. LHC in 4/21 showed nonobstructive mild CAD.  Cardiolite  in 4/25 was similar with inferior and inferolateral infarction, no ischemia.  No chest pain.  - Continue atorvastatin  + fenofibrate  + Repatha . Good lipids 11/2023  - She has been on ASA 81 and Plavix  75 mg daily.   2. Chronic systolic CHF: Ischemic cardiomyopathy.  RHC in 4/21 with normal filling pressures and preserved cardiac output.  Echo 2/24 showed stable EF 30-35%.  Echo in 3/25 showed EF 35-40%, normal RV.  Echo 9/25 showed EF 40%, G1DD, normal RV. Much improved NYHA I-II. GDMT limited by hypotension & orthostasis. She is not volume overloaded by exam or by device interrogation - Continue compression hose.  - Continue midodrine  5 mg tid - Continue Lasix  40 mg daily + 20 KCL daily. Recent labs reviewed and are stable - Continue Farixga 10 mg daily.   - Continue Toprol  XL 12.5 mg at bedtime.  - Off spironolactone  with orthostasis - Off losartan  with low BP and need for midodrine  - Candidate for CCM. Will have her assessed for this by EP after lung cancer treatment. She has Oncology follow up later this month. 3. Carotid stenosis: Carotid dopplers 12/24 showed 40-59% LICA stenosis - Repeat 04/2024 4. Smoking:  She is staying off cigarettes since 02/2023. Congratulated. 5. 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  - Continue Plavix  per Dr. Dolphus (on hold for lung cancer surgery).  6. Lung cancer: Non-small cell lung CA, adenocarcinoma, diagnosed 2/25. S/p robot assisted thoracoscopy and lobectomy by Dr. Shyrl 4/25. 5/5 lymph nodes negative for cancer. - She follows with Dr. Sherrod   Follow up in 3 months with Dr. Rolan Raisin [facility] FNP-BC 02/07/2024

## 2024-02-05 ENCOUNTER — Ambulatory Visit: Attending: Cardiology

## 2024-02-05 DIAGNOSIS — Z95810 Presence of automatic (implantable) cardiac defibrillator: Secondary | ICD-10-CM

## 2024-02-05 DIAGNOSIS — I5022 Chronic systolic (congestive) heart failure: Secondary | ICD-10-CM

## 2024-02-06 ENCOUNTER — Telehealth (HOSPITAL_COMMUNITY): Payer: Self-pay

## 2024-02-06 NOTE — Telephone Encounter (Signed)
 Called to confirm/remind patient of their appointment at the [facility] on 02/07/24.   Appointment:   [] Confirmed  [x] Left mess   [] No answer/No voice mail  [] VM Full/unable to leave message  [] Phone not in service  And to bring in all medications and/or complete list.

## 2024-02-07 ENCOUNTER — Encounter (HOSPITAL_COMMUNITY): Payer: Self-pay

## 2024-02-07 ENCOUNTER — Ambulatory Visit (HOSPITAL_COMMUNITY)
Admission: RE | Admit: 2024-02-07 | Discharge: 2024-02-07 | Disposition: A | Discharge status: Home / Self Care | Source: Ambulatory Visit | Attending: Family Medicine | Admitting: Family Medicine

## 2024-02-07 VITALS — BP 108/76 | HR 69 | Wt 118.0 lb

## 2024-02-07 DIAGNOSIS — Z85118 Personal history of other malignant neoplasm of bronchus and lung: Secondary | ICD-10-CM | POA: Diagnosis not present

## 2024-02-07 DIAGNOSIS — I671 Cerebral aneurysm, nonruptured: Secondary | ICD-10-CM | POA: Diagnosis not present

## 2024-02-07 DIAGNOSIS — Z955 Presence of coronary angioplasty implant and graft: Secondary | ICD-10-CM | POA: Insufficient documentation

## 2024-02-07 DIAGNOSIS — I252 Old myocardial infarction: Secondary | ICD-10-CM | POA: Diagnosis not present

## 2024-02-07 DIAGNOSIS — I5022 Chronic systolic (congestive) heart failure: Secondary | ICD-10-CM | POA: Insufficient documentation

## 2024-02-07 DIAGNOSIS — Z7984 Long term (current) use of oral hypoglycemic drugs: Secondary | ICD-10-CM | POA: Diagnosis not present

## 2024-02-07 DIAGNOSIS — I2510 Atherosclerotic heart disease of native coronary artery without angina pectoris: Secondary | ICD-10-CM | POA: Insufficient documentation

## 2024-02-07 DIAGNOSIS — C3490 Malignant neoplasm of unspecified part of unspecified bronchus or lung: Secondary | ICD-10-CM

## 2024-02-07 DIAGNOSIS — Z7982 Long term (current) use of aspirin: Secondary | ICD-10-CM | POA: Diagnosis not present

## 2024-02-07 DIAGNOSIS — Z87891 Personal history of nicotine dependence: Secondary | ICD-10-CM | POA: Diagnosis not present

## 2024-02-07 DIAGNOSIS — Z79899 Other long term (current) drug therapy: Secondary | ICD-10-CM | POA: Diagnosis not present

## 2024-02-07 DIAGNOSIS — I255 Ischemic cardiomyopathy: Secondary | ICD-10-CM | POA: Insufficient documentation

## 2024-02-07 DIAGNOSIS — Z7902 Long term (current) use of antithrombotics/antiplatelets: Secondary | ICD-10-CM | POA: Insufficient documentation

## 2024-02-07 DIAGNOSIS — I6529 Occlusion and stenosis of unspecified carotid artery: Secondary | ICD-10-CM | POA: Insufficient documentation

## 2024-02-07 NOTE — Progress Notes (Signed)
 EPIC Encounter for ICM Monitoring  Patient Name: Erin [name]  TAUNYA GORAL is a 63 y.o. female Date: 02/07/2024 Primary Care Physican: Patient, No Pcp Per Primary Cardiologist: Rolan Electrophysiologist: Inocencio 08/11/2021 Office Weight: 121 lbs. 03/25/2022 Office Weight 125-127 lbs 03/13/2023 Weight: 110 lbs 03/24/2023 Weight: 115 lbs 05/26/2023 Weight: 105 lbs 07/10/2023 Weight: 103 lbs 09/15/2023 Office Weight: 110 lbs 10/04/2023 Weight: 107 lbs 12/06/2023 Home Weight: 107-110 lbs 01/16/2024 Home Weight: 110 lbs                                                          Transmission results reviewed.  Pt being seen in HF clinic office 02/07/2024.   Diet:  Discussed diet and fluid intake.  She limits fluid intake to 64 oz and following low salt diet.     Since 01/22/2024 ICM Remote Transmission:  Optivol thoracic impedance suggesting fluid levels returned to normal 02/01/2024 but possible fluid accumulation returned 02/02/2024.   Prescribed: Furosemide  20 mg Take 2 tablets (40 mg total) by mouth daily.   Potassium 20 mEq take 1 tablets (20 mEq total) by mouth daily Spironolactone  25 mg take 0.5 tablet (12.5 mg total) by mouth at bedtime     Labs: 01/11/2024 Creatinine 1.45, BUN 22, Potassium 4.0, Sodium 138, GFR 41  01/10/2024 Creatinine 1.71, BUN 23, Potassium 4.7, Sodium 137, GFR 34  12/12/2023 Creatinine 1.89, BUN 17, Potassium 4.3, Sodium 140, GFR 30 11/23/2023 Creatinine 2.10, BUN 24, Potassium 4.4, Sodium 139, GFR 26  11/14/2023 Creatinine 1.90, BUN 16, Potassium 3.5, Sodium 138, GFR 30 (Lasix  decreased to 20 mg day in response to results) 11/02/2023 Creatinine 1.54, BUN 18, Potassium 3.8, Sodium 141  A complete set of results can be found in Results Review.   Recommendations:  Any recommendations will be given at 02/07/2024 HF clinic office visit.    Follow-up plan: ICM clinic phone appointment on 02/26/2024.   91 day device clinic remote transmission 02/14/2024.     EP/Cardiology Office Visits:    02/07/2024 with HF Clinic.  Recall 02/18/2024 with [name], PA.    Copy of ICM check sent to Dr. Inocencio.     Remote monitoring is medically necessary for Heart Failure Management.    90 day Daily Thoracic Impedance ICM trend: 11/06/2023 through 02/05/2024.    12-14 Month Thoracic Impedance ICM trend:     [name], RN 02/07/2024 8:48 AM

## 2024-02-07 NOTE — Patient Instructions (Addendum)
 Good to see you today!.  Your physician recommends that you schedule a follow-up appointment as schedule  If you have any questions or concerns before your next appointment please send us  a message through [place] or call our office at [phone].    TO LEAVE A MESSAGE FOR THE NURSE SELECT OPTION 2, PLEASE LEAVE A MESSAGE INCLUDING: YOUR NAME DATE OF BIRTH CALL BACK NUMBER REASON FOR CALL**this is important as we prioritize the call backs  YOU WILL RECEIVE A CALL BACK THE SAME DAY AS LONG AS YOU CALL BEFORE 4:00 PM At the [facility], you and your health needs are our priority. As part of our continuing mission to provide you with exceptional heart care, we have created designated Provider Care Teams. These Care Teams include your primary Cardiologist (physician) and Advanced Practice Providers (APPs- Physician Assistants and Nurse Practitioners) who all work together to provide you with the care you need, when you need it.   You may see any of the following providers on your designated Care Team at your next follow up: Dr Toribio Fuel Dr Ezra Shuck Dr. Ria Commander Dr. Morene Brownie Amy Lenetta, NP Caffie Shed, [place] [facility] [place], [place] Beckey Coe, NP [place] Lee, NP Ellouise Class, NP Tinnie Redman, PharmD Jaun Bash, PharmD   Please be sure to bring in all your medications bottles to every appointment.    Thank you for choosing [facility]

## 2024-02-12 ENCOUNTER — Ambulatory Visit (HOSPITAL_COMMUNITY): Admitting: Registered Nurse

## 2024-02-14 ENCOUNTER — Other Ambulatory Visit: Payer: Self-pay

## 2024-02-14 ENCOUNTER — Other Ambulatory Visit: Payer: Self-pay | Admitting: Gastroenterology

## 2024-02-14 ENCOUNTER — Ambulatory Visit: Payer: Medicaid Other

## 2024-02-14 DIAGNOSIS — I2510 Atherosclerotic heart disease of native coronary artery without angina pectoris: Secondary | ICD-10-CM

## 2024-02-14 DIAGNOSIS — I5022 Chronic systolic (congestive) heart failure: Secondary | ICD-10-CM

## 2024-02-15 ENCOUNTER — Other Ambulatory Visit: Payer: Self-pay | Admitting: Cardiology

## 2024-02-15 LAB — CUP PACEART REMOTE DEVICE CHECK
Battery Remaining Longevity: 97 mo
Battery Voltage: 3 V
Brady Statistic RV Percent Paced: 0.01 %
Date Time Interrogation Session: 20251009064716
HighPow Impedance: 81 Ohm
Implantable Lead Connection Status: 753985
Implantable Lead Implant Date: 20211126
Implantable Lead Location: 753860
Implantable Pulse Generator Implant Date: 20211126
Lead Channel Impedance Value: 437 Ohm
Lead Channel Impedance Value: 570 Ohm
Lead Channel Pacing Threshold Amplitude: 0.75 V
Lead Channel Pacing Threshold Pulse Width: 0.4 ms
Lead Channel Sensing Intrinsic Amplitude: 4.125 mV
Lead Channel Sensing Intrinsic Amplitude: 4.125 mV
Lead Channel Setting Pacing Amplitude: 2 V
Lead Channel Setting Pacing Pulse Width: 0.4 ms
Lead Channel Setting Sensing Sensitivity: 0.3 mV
Zone Setting Status: 755011
Zone Setting Status: 755011

## 2024-02-16 ENCOUNTER — Ambulatory Visit: Payer: Self-pay | Admitting: Cardiology

## 2024-02-16 MED ORDER — FARXIGA 10 MG PO TABS: 10.0000 mg | ORAL_TABLET | Freq: Every day | ORAL | 0 refills | Status: AC

## 2024-02-16 NOTE — Progress Notes (Signed)
 Remote ICD Transmission

## 2024-02-19 ENCOUNTER — Encounter: Payer: Self-pay | Admitting: Internal Medicine

## 2024-02-19 NOTE — Progress Notes (Signed)
 Remote ICD Transmission

## 2024-02-22 ENCOUNTER — Encounter (HOSPITAL_COMMUNITY): Payer: Self-pay

## 2024-02-22 ENCOUNTER — Telehealth: Payer: Self-pay | Admitting: Internal Medicine

## 2024-02-22 ENCOUNTER — Inpatient Hospital Stay

## 2024-02-22 ENCOUNTER — Ambulatory Visit (HOSPITAL_COMMUNITY)

## 2024-02-22 NOTE — Telephone Encounter (Signed)
 Rescheduled appointments per the patients request and informed her that she will have to contact the radiology department to reschedule her CT scan.

## 2024-02-26 ENCOUNTER — Ambulatory Visit: Attending: Cardiology

## 2024-02-26 DIAGNOSIS — I5022 Chronic systolic (congestive) heart failure: Secondary | ICD-10-CM | POA: Diagnosis not present

## 2024-02-26 DIAGNOSIS — Z95810 Presence of automatic (implantable) cardiac defibrillator: Secondary | ICD-10-CM | POA: Diagnosis not present

## 2024-02-29 ENCOUNTER — Inpatient Hospital Stay

## 2024-02-29 ENCOUNTER — Ambulatory Visit: Admitting: Internal Medicine

## 2024-02-29 ENCOUNTER — Encounter: Payer: Self-pay | Admitting: Family Medicine

## 2024-03-01 ENCOUNTER — Encounter (HOSPITAL_COMMUNITY): Payer: Self-pay

## 2024-03-01 ENCOUNTER — Inpatient Hospital Stay: Attending: Internal Medicine

## 2024-03-01 ENCOUNTER — Ambulatory Visit (HOSPITAL_COMMUNITY)
Admission: RE | Admit: 2024-03-01 | Discharge: 2024-03-01 | Disposition: A | Discharge status: Home / Self Care | Source: Ambulatory Visit | Attending: Internal Medicine | Admitting: Internal Medicine

## 2024-03-01 DIAGNOSIS — J432 Centrilobular emphysema: Secondary | ICD-10-CM | POA: Diagnosis not present

## 2024-03-01 DIAGNOSIS — C3490 Malignant neoplasm of unspecified part of unspecified bronchus or lung: Secondary | ICD-10-CM

## 2024-03-01 DIAGNOSIS — C3412 Malignant neoplasm of upper lobe, left bronchus or lung: Secondary | ICD-10-CM | POA: Diagnosis not present

## 2024-03-01 DIAGNOSIS — I700 Atherosclerosis of aorta: Secondary | ICD-10-CM | POA: Diagnosis not present

## 2024-03-01 LAB — CMP (CANCER CENTER ONLY)
ALT: 9 U/L (ref 0–44)
AST: 18 U/L (ref 15–41)
Albumin: 4 g/dL (ref 3.5–5.0)
Alkaline Phosphatase: 45 U/L (ref 38–126)
Anion gap: 6 (ref 5–15)
BUN: 21 mg/dL (ref 8–23)
CO2: 28 mmol/L (ref 22–32)
Calcium: 9.7 mg/dL (ref 8.9–10.3)
Chloride: 106 mmol/L (ref 98–111)
Creatinine: 1.77 mg/dL — ABNORMAL HIGH (ref 0.44–1.00)
GFR, Estimated: 32 mL/min — ABNORMAL LOW (ref >60)
Glucose, Bld: 93 mg/dL (ref 70–99)
Potassium: 3.9 mmol/L (ref 3.5–5.1)
Sodium: 140 mmol/L (ref 135–145)
Total Bilirubin: 0.3 mg/dL (ref 0.0–1.2)
Total Protein: 6.3 g/dL — ABNORMAL LOW (ref 6.5–8.1)

## 2024-03-01 LAB — CBC WITH DIFFERENTIAL (CANCER CENTER ONLY)
Abs Immature Granulocytes: 0.06 K/uL (ref 0.00–0.07)
Basophils Absolute: 0.1 K/uL (ref 0.0–0.1)
Basophils Relative: 1 %
Eosinophils Absolute: 0.1 K/uL (ref 0.0–0.5)
Eosinophils Relative: 1 %
HCT: 34.1 % — ABNORMAL LOW (ref 36.0–46.0)
Hemoglobin: 11.3 g/dL — ABNORMAL LOW (ref 12.0–15.0)
Immature Granulocytes: 1 %
Lymphocytes Relative: 26 %
Lymphs Abs: 2.3 K/uL (ref 0.7–4.0)
MCH: 26.2 pg (ref 26.0–34.0)
MCHC: 33.1 g/dL (ref 30.0–36.0)
MCV: 79.1 fL — ABNORMAL LOW (ref 80.0–100.0)
Monocytes Absolute: 0.6 K/uL (ref 0.1–1.0)
Monocytes Relative: 7 %
Neutro Abs: 5.7 K/uL (ref 1.7–7.7)
Neutrophils Relative %: 64 %
Platelet Count: 381 K/uL (ref 150–400)
RBC: 4.31 MIL/uL (ref 3.87–5.11)
RDW: 16.2 % — ABNORMAL HIGH (ref 11.5–15.5)
WBC Count: 8.7 K/uL (ref 4.0–10.5)
nRBC: 0 % (ref 0.0–0.2)

## 2024-03-01 NOTE — Progress Notes (Signed)
 EPIC Encounter for ICM Monitoring  Patient Name: Erin  Erin [name] is a 63 y.o. female Date: 03/01/2024 Primary Care Physican: Patient, No Pcp Per Primary Cardiologist: Erin [name] Electrophysiologist: Erin [name] 08/11/2021 Office Weight: 121 lbs. 03/25/2022 Office Weight 125-127 lbs 03/13/2023 Weight: 110 lbs 03/24/2023 Weight: 115 lbs 05/26/2023 Weight: 105 lbs 07/10/2023 Weight: 103 lbs 09/15/2023 Office Weight: 110 lbs 10/04/2023 Weight: 107 lbs 12/06/2023 Home Weight: 107-110 lbs 01/16/2024 Home Weight: 110 lbs 03/01/2024 Weight: 118 lbs                                                          Spoke with patient and heart failure questions reviewed.  Transmission results reviewed.  Pt asymptomatic for fluid accumulation.  Reports feeling well at this time and voices no complaints.     Diet:  No updates    Since 02/05/2024 ICM Remote Transmission:  Optivol thoracic impedance suggesting normal fluid levels.   Prescribed: Furosemide  20 mg Take 2 tablets (40 mg total) by mouth daily.   Potassium 20 mEq take 1 tablet (20 mEq total) by mouth daily Spironolactone  25 mg take 0.5 tablet (12.5 mg total) by mouth at bedtime.  Per 01/24/2024 HF clinic note, stopped Spironolactone  and pt confirmed 03/01/2024 she is no longer taking.   Labs: 03/01/2024 Creatinine 1.77, BUN 21, Potassium 3.9, Sodium 140, GFR 32 01/24/2024 Creatinine 1.59, BUN 19, Potassium 3.7, Sodium 139 01/11/2024 Creatinine 1.45, BUN 22, Potassium 4.0, Sodium 138, GFR 41  01/10/2024 Creatinine 1.71, BUN 23, Potassium 4.7, Sodium 137, GFR 34  12/12/2023 Creatinine 1.89, BUN 17, Potassium 4.3, Sodium 140, GFR 30 11/23/2023 Creatinine 2.10, BUN 24, Potassium 4.4, Sodium 139, GFR 26  11/14/2023 Creatinine 1.90, BUN 16, Potassium 3.5, Sodium 138, GFR 30 (Lasix  decreased to 20 mg day in response to results) 11/02/2023 Creatinine 1.54, BUN 18, Potassium 3.8, Sodium 141  A complete set of results can be found in Results Review.   Recommendations:   No changes and encouraged to call if experiencing any fluid symptoms.   Follow-up plan: ICM clinic phone appointment on 04/01/2024.   91 day device clinic remote transmission 05/15/2024.     EP/Cardiology Office Visits:   05/08/2024 with HF Clinic.  Recall 02/18/2024 with Erin Passey, PA.    Copy of ICM check sent to Dr. Inocencio.     Remote monitoring is medically necessary for Heart Failure Management.    Daily Thoracic Impedance ICM trend: 11/27/2023 through 02/26/2024.    12-14 Month Thoracic Impedance ICM trend:     Erin GORMAN Garner, RN 03/01/2024 2:41 PM

## 2024-03-02 ENCOUNTER — Other Ambulatory Visit: Payer: Self-pay | Admitting: Family Medicine

## 2024-03-02 DIAGNOSIS — G6289 Other specified polyneuropathies: Secondary | ICD-10-CM

## 2024-03-06 NOTE — Progress Notes (Unsigned)
 Office Note    HPI: Erin [name] is a 63 y.o. (November 02, 1960) female presenting in follow-up with known 3.3 cm aneurysm.  At her last visit, it was determined the aneurysm is grown 11 mm over the last 5 years.  A native of [place] , she now lives in [place] .  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  She is now retired and enjoys spending time with her kids and grandkids.  She has 4 daughters, 1 of which is deceased, 69 grandchildren, and 3 great-grandchildren, 1 who is deceased.  She has 7 of these grandchildren in her home.  On exam, Erin [name]  was doing well.   Erin [name]  denies history of claudication, ischemic rest pain, tissue loss.  She was a previous smoker but stopped years ago due to cancer diagnosis. She denies abdominal, back, chest pain.   The AAA was found incidentally by CTA in the emergency department performed for abdominal pain. Surgical history includes 08/31/2023 left upper lobe lobectomy for adenocarcinoma.  EF noted to be 35%.   Past Medical History:  Diagnosis Date   AICD (automatic cardioverter/defibrillator) present    Allergic reaction to alpha-gal    Allergy 03/05/2022   Anemia    Anxiety state 03/25/2016   Arthritis    Basal cell carcinoma of forehead    Brain aneurysm    CHF (congestive heart failure) (HCC)    Coronary artery disease    a. 03/11/16 PCI with DES-->Prox/Mid Cx;  b. 03/14/16 PCI with DES x2-->RCA, EF 30-35%.   Depression 06/09/2010   Encounter for general adult medical examination with abnormal findings 05/04/2022   Essential hypertension    Hx   GERD (gastroesophageal reflux disease)    HFrEF (heart failure with reduced ejection fraction) (HCC)    a. 10/2016 Echo: EF 35-40%, Gr1 DD, mild focal basal septal hypertrophy, basal inflat, mid inflat, basal antlat AK. Mid infept/inf/antlat, apical lateral sev HK. Mod MR. mildly reduced RV fxn. Mild TR.   History of pneumonia    Hyperlipidemia    IBS (irritable bowel  syndrome)    Ischemic cardiomyopathy    a. 10/2016 Echo: EF 35-40%, Gr1 DD.   Lung cancer [facility])    Mitral regurgitation    Neuromuscular disorder (HCC)    NSTEMI (non-ST elevated myocardial infarction) (HCC) 03/10/2016   Pneumonia 03/2016   Shingles    Squamous cell cancer of skin of nose    Thrombocytosis 03/26/2016   Tobacco abuse    Trichimoniasis    Wears dentures    Wears glasses     Past Surgical History:  Procedure Laterality Date   APPENDECTOMY     BICEPT TENODESIS Left 03/21/2023   Procedure: BICEPS TENODESIS;  Surgeon: Addie Cordella Hamilton, MD;  Location: [facility] OR;  Service: Orthopedics;  Laterality: Left;   BIOPSY  09/20/2018   Procedure: BIOPSY;  Surgeon: Shaaron Lamar HERO, MD;  Location: [facility];  Service: Endoscopy;;  colon   BIOPSY  01/05/2021   Procedure: BIOPSY;  Surgeon: Teressa Toribio SQUIBB, MD;  Location: [facility] ENDOSCOPY;  Service: Endoscopy;;   BIOPSY  04/27/2022   Procedure: BIOPSY;  Surgeon: Cindie Carlin POUR, DO;  [facility]: [facility];  Service: Endoscopy;;   BRONCHIAL BIOPSY  07/03/2023   Procedure: BRONCHIAL BIOPSIES;  Surgeon: Shelah Lamar RAMAN, MD;  Location: [facility] ENDOSCOPY;  Service: Pulmonary;;   BRONCHIAL BRUSHINGS  07/03/2023   Procedure: BRONCHIAL BRUSHINGS;  Surgeon: Shelah Lamar RAMAN, MD;  Location: [facility] ENDOSCOPY;  Service: Pulmonary;;  BRONCHIAL NEEDLE ASPIRATION BIOPSY  07/03/2023   Procedure: BRONCHIAL NEEDLE ASPIRATION BIOPSIES;  Surgeon: Shelah Lamar RAMAN, MD;  Location: [facility] ENDOSCOPY;  Service: Pulmonary;;   BRONCHIAL WASHINGS  07/03/2023   Procedure: BRONCHIAL WASHINGS;  Surgeon: Shelah Lamar RAMAN, MD;  Location: [facility] ENDOSCOPY;  Service: Pulmonary;;   CARDIAC CATHETERIZATION N/A 03/11/2016   Procedure: Left Heart Cath and Coronary Angiography;  Surgeon: Alm LELON Clay, MD;  Location: [facility] INVASIVE CV LAB;  Service: Cardiovascular;  Laterality: N/A;   CARDIAC CATHETERIZATION N/A 03/11/2016   Procedure: Coronary Stent Intervention;  Surgeon: Alm LELON Clay, MD;  Location: [facility] INVASIVE CV LAB;  Service: Cardiovascular;  Laterality: N/A;   CARDIAC CATHETERIZATION N/A 03/14/2016   Procedure: Coronary Stent Intervention;  Surgeon: Peter M Jordan, MD;  Location: [facility] INVASIVE CV LAB;  Service: Cardiovascular;  Laterality: N/A;   CEREBRAL ANEURYSM REPAIR  04/2019   stent placed   CHOLECYSTECTOMY OPEN  1984   COLONOSCOPY N/A 08/28/2023   Procedure: COLONOSCOPY;  Surgeon: Cindie Carlin POUR, DO;  [facility];  Service: Endoscopy;  Laterality: N/A;  1030am, asa 3, has defibrillator   COLONOSCOPY WITH PROPOFOL  N/A 09/20/2018   Procedure: COLONOSCOPY WITH PROPOFOL ;  Surgeon: Shaaron Lamar HERO, MD;  [facility];  Service: Endoscopy;  Laterality: N/A;  10:30am   CORONARY ANGIOPLASTY WITH STENT PLACEMENT  03/14/2016   ESOPHAGOGASTRODUODENOSCOPY (EGD) WITH PROPOFOL  N/A 09/20/2018   Procedure: ESOPHAGOGASTRODUODENOSCOPY (EGD) WITH PROPOFOL ;  Surgeon: Shaaron Lamar HERO, MD;  [facility];  Service: Endoscopy;  Laterality: N/A;   ESOPHAGOGASTRODUODENOSCOPY (EGD) WITH PROPOFOL  N/A 01/05/2021   Procedure: ESOPHAGOGASTRODUODENOSCOPY (EGD) WITH PROPOFOL ;  Surgeon: Teressa Toribio SQUIBB, MD;  Location: [facility] ENDOSCOPY;  Service: Endoscopy;  Laterality: N/A;   ESOPHAGOGASTRODUODENOSCOPY (EGD) WITH PROPOFOL  N/A 04/27/2022   Procedure: ESOPHAGOGASTRODUODENOSCOPY (EGD) WITH PROPOFOL ;  Surgeon: Cindie Carlin POUR, DO;  [facility];  Service: Endoscopy;  Laterality: N/A;  9:15am, asa 3/4, ASAP   ESOPHAGOGASTRODUODENOSCOPY (EGD) WITH PROPOFOL  N/A 01/04/2023   Procedure: ESOPHAGOGASTRODUODENOSCOPY (EGD) WITH PROPOFOL ;  Surgeon: Shaaron Lamar HERO, MD;  [facility];  Service: Endoscopy;  Laterality: N/A;  130pm, asa 3   FIDUCIAL MARKER PLACEMENT  07/03/2023   Procedure: FIDUCIAL MARKER PLACEMENT;  Surgeon: Shelah Lamar RAMAN, MD;  Location: [name] ENDOSCOPY;  Service: Pulmonary;;   FINGER ARTHROPLASTY Left 05/14/2013   Procedure: LEFT  THUMB CARPAL METACARPAL ARTHROPLASTY;  Surgeon: Franky JONELLE Curia, MD;  Location: [facility];  Service: Orthopedics;  Laterality: Left;   ICD IMPLANT N/A 04/03/2020   Procedure: ICD IMPLANT;  Surgeon: Inocencio Soyla Lunger, MD;  Location: [facility] INVASIVE CV LAB;  Service: Cardiovascular;  Laterality: N/A;   INTERCOSTAL NERVE BLOCK Left 08/31/2023   Procedure: BLOCK, NERVE, INTERCOSTAL;  Surgeon: Shyrl Linnie KIDD, MD;  Location: [facility] OR;  Service: Thoracic;  Laterality: Left;   IR ANGIO INTRA EXTRACRAN SEL COM CAROTID INNOMINATE BILAT MOD SED  01/05/2017   IR ANGIO INTRA EXTRACRAN SEL COM CAROTID INNOMINATE BILAT MOD SED  03/19/2019   IR ANGIO INTRA EXTRACRAN SEL COM CAROTID INNOMINATE BILAT MOD SED  06/04/2020   IR ANGIO INTRA EXTRACRAN SEL INTERNAL CAROTID UNI L MOD SED  04/15/2019   IR ANGIO VERTEBRAL SEL VERTEBRAL BILAT MOD SED  01/05/2017   IR ANGIO VERTEBRAL SEL VERTEBRAL BILAT MOD SED  03/19/2019   IR ANGIO VERTEBRAL SEL VERTEBRAL UNI L MOD SED  06/04/2020   IR ANGIOGRAM FOLLOW UP STUDY  04/15/2019   IR RADIOLOGIST EVAL & MGMT  12/30/2016   IR TRANSCATH/EMBOLIZ  04/15/2019   IR US  GUIDE VASC ACCESS RIGHT  03/19/2019   IR US  GUIDE VASC ACCESS RIGHT  06/04/2020   JOINT REPLACEMENT     LOBECTOMY, LUNG, ROBOT-ASSISTED, USING VATS Left 08/31/2023   Procedure: LOBECTOMY, LEFT UPPER LUNG, ROBOT-ASSISTED, USING VATS;  Surgeon: Shyrl Linnie KIDD, MD;  Location: [facility] OR;  Service: Thoracic;  Laterality: Left;  Left Robotic Assisted Thoracoscopy for Left Upper Lobectomy   LYMPH NODE BIOPSY Left 08/31/2023   Procedure: LYMPH NODE BIOPSY;  Surgeon: Shyrl Linnie KIDD, MD;  Location: [facility] OR;  Service: Thoracic;  Laterality: Left;   MALONEY DILATION N/A 09/20/2018   Procedure: AGAPITO DILATION;  Surgeon: Shaaron Lamar HERO, MD;  [facility];  Service: Endoscopy;  Laterality: N/A;   MALONEY DILATION N/A 01/04/2023   Procedure: AGAPITO DILATION;  Surgeon: Shaaron Lamar HERO, MD;  [facility];  Service: Endoscopy;  Laterality: N/A;   RADIOLOGY WITH ANESTHESIA N/A 04/15/2019   Procedure: BARBARANN;  Surgeon: Dolphus Carrion, MD;  Location: [facility] OR;  Service: Radiology;  Laterality: N/A;   REVERSE SHOULDER ARTHROPLASTY Left 03/21/2023   Procedure: LEFT REVERSE SHOULDER ARTHROPLASTY;  Surgeon: Addie Cordella Hamilton, MD;  Location: [facility] OR;  Service: Orthopedics;  Laterality: Left;   RIGHT/LEFT HEART CATH AND CORONARY ANGIOGRAPHY N/A 08/19/2019   Procedure: RIGHT/LEFT HEART CATH AND CORONARY ANGIOGRAPHY;  Surgeon: Rolan Ezra RAMAN, MD;  Location: [facility] INVASIVE CV LAB;  Service: Cardiovascular;  Laterality: N/A;   TUBAL LIGATION  1987   VAGINAL HYSTERECTOMY  2009    Social History   Socioeconomic History   Marital status: Married    Spouse name: Not on file   Number of children: Not on file   Years of education: Not on file   Highest education level: Associate degree: occupational, scientist, product/process development, or vocational program  Occupational History   Occupation: CNA  Tobacco Use   Smoking status: Former    Current packs/day: 1.50    Average packs/day: 1.5 packs/day for 40.0 years (60.0 ttl pk-yrs)    Types: Cigarettes   Smokeless tobacco: Never   Tobacco comments:    Smokes off and on 06/14/23 Sonny Lecher, CMA     No cigarettes since Thursday this week>> 07/06/2023  Vaping Use   Vaping status: Never Used  Substance and Sexual Activity   Alcohol use: Not Currently    Comment: occasionally   Drug use: Not Currently    Types: Marijuana   Sexual activity: Not Currently    Birth control/protection: Surgical    Comment: hyst  Other Topics Concern   Not on file  Social History Narrative   Lives with husband in [place] in a one story home with a basement.  Has 4 children.  Works as a LAWYER.  Education: CNA school.    Social Drivers of Corporate Investment Banker Strain: Low Risk  (10/18/2023)   Overall Financial Resource Strain (CARDIA)    Difficulty of Paying Living  Expenses: Not hard at all  Food Insecurity: No Food Insecurity (01/10/2024)   Hunger Vital Sign    Worried About Running Out of Food in the Last Year: Never true    Ran Out of Food in the Last Year: Never true  Transportation Needs: No Transportation Needs (01/10/2024)   PRAPARE - Administrator, Civil Service (Medical): No    Lack of Transportation (Non-Medical): No  Physical Activity: Sufficiently Active (10/18/2023)   Exercise Vital Sign  Days of Exercise per Week: 3 days    Minutes of Exercise per Session: 60 min  Stress: Stress Concern Present (10/18/2023)   [employer] of Occupational Health - Occupational Stress Questionnaire    Feeling of Stress : Very much  Social Connections: Socially Isolated (01/10/2024)   Social Connection and Isolation Panel    Frequency of Communication with Friends and Family: Once a week    Frequency of Social Gatherings with Friends and Family: Never    Attends Religious Services: Never    Database Administrator or Organizations: No    Attends Banker Meetings: Never    Marital Status: Married  Catering Manager Violence: Not At Risk (01/10/2024)   Humiliation, Afraid, Rape, and Kick questionnaire    Fear of Current or Ex-Partner: No    Emotionally Abused: No    Physically Abused: No    Sexually Abused: No   Family History  Problem Relation Age of Onset   Stroke Mother    Hypertension Mother    Diabetes Mother    Heart attack Mother    Heart attack Father    Diabetes Father    Hypertension Father    CAD Father    Colon polyps Father 4       pre-cancerous    Stroke Father    Dementia Father    Hyperlipidemia Father    Arthritis Father    COPD Father        smoked and worked archivist mills   Heart disease Father    Breast cancer Maternal Grandmother    Diabetes Maternal Grandmother    Cancer Maternal Grandfather        Tongue and esophageal   Cancer Paternal Grandmother    Anxiety disorder Daughter     Depression Daughter    Anxiety disorder Daughter    Heart failure Other    Colon cancer Neg Hx     Current Outpatient Medications  Medication Sig Dispense Refill   aspirin  EC 81 MG tablet Take 1 tablet (81 mg total) by mouth every evening. Okay to restart on 2/29/2025     atorvastatin  (LIPITOR ) 40 MG tablet Take 1 tablet (40 mg total) by mouth daily. 30 tablet 11   clopidogrel  (PLAVIX ) 75 MG tablet Take 1 tablet by mouth once daily 90 tablet 3   EPINEPHrine  (EPIPEN  2-PAK) 0.3 mg/0.3 mL IJ SOAJ injection Inject 0.3 mg into the muscle as needed for anaphylaxis. 2 each 2   ezetimibe  (ZETIA ) 10 MG tablet Take 1 tablet (10 mg total) by mouth daily. 90 tablet 3   FARXIGA  10 MG TABS tablet Take 1 tablet (10 mg total) by mouth daily before breakfast. 90 tablet 0   fenofibrate  (TRICOR ) 145 MG tablet Take 1 tablet by mouth once daily 30 tablet 11   furosemide  (LASIX ) 20 MG tablet Take 2 tablets (40 mg total) by mouth daily. 90 tablet 3   gabapentin  (NEURONTIN ) 300 MG capsule Take 1 capsule by mouth at bedtime 30 capsule 0   linaclotide  (LINZESS ) 145 MCG CAPS capsule Take 1 capsule (145 mcg total) by mouth daily before breakfast. 30 capsule 11   metoprolol  succinate (TOPROL  XL) 25 MG 24 hr tablet Take 0.5 tablets (12.5 mg total) by mouth at bedtime. 90 tablet 3   Multiple Vitamins-Minerals (MULTIVITAMIN WITH MINERALS) tablet Take 1 tablet by mouth daily.     nicotine  (NICODERM CQ  - DOSED IN MG/24 HOURS) 21 mg/24hr patch Place 21 mg onto the skin daily.  nitroGLYCERIN  (NITROSTAT ) 0.4 MG SL tablet Place 1 tablet (0.4 mg total) under the tongue every 5 (five) minutes x 3 doses as needed for chest pain (if no relief after 2nd dose, proceed to the ED for an evaluation or call 911). 25 tablet 2   ondansetron  (ZOFRAN ) 8 MG tablet Take 1 tablet (8 mg total) by mouth every 8 (eight) hours as needed for nausea or vomiting. 90 tablet 3   pantoprazole  (PROTONIX ) 40 MG tablet TAKE 1 TABLET BY MOUTH TWICE DAILY  BEFORE A MEAL 60 tablet 5   potassium chloride  SA (KLOR-CON  M) 20 MEQ tablet Take 1 tablet (20 mEq total) by mouth daily. 90 tablet 3   promethazine  (PHENERGAN ) 12.5 MG tablet TAKE 1 TABLET BY MOUTH EVERY 6 HOURS AS NEEDED FOR NAUSEA OR VOMITING 20 tablet 0   REPATHA  SURECLICK 140 MG/ML SOAJ INJECT 140 MG INTO THE SKIN EVERY 14 DAYS 6 mL 0   rOPINIRole  (REQUIP ) 0.5 MG tablet Take 0.5 mg by mouth at bedtime.   0   sertraline  (ZOLOFT ) 25 MG tablet Take 1 tablet (25 mg total) by mouth daily. 30 tablet 0   spironolactone  (ALDACTONE ) 25 MG tablet Take 0.5 tablets (12.5 mg total) by mouth daily. (Patient not taking: Reported on 03/01/2024) 45 tablet 3   traZODone  (DESYREL ) 50 MG tablet TAKE 1 TABLET BY MOUTH AT BEDTIME 90 tablet 0   No current facility-administered medications for this visit.    Allergies  Allergen Reactions   Alpha-Gal Shortness Of Breath, Nausea And Vomiting and Dermatitis   Other Shortness Of Breath, Diarrhea, Nausea And Vomiting and Nausea Only    All- red meats/dairy  Medications in Capsule form    Bovine (Beef) Protein-Containing Drug Products Diarrhea and Nausea And Vomiting   Chantix  [Varenicline ] Nausea Only   Porcine (Pork) Protein-Containing Drug Products Diarrhea and Nausea And Vomiting   Reglan  [Metoclopramide ]     Involuntary movements   Tape Other (See Comments)    PEELS SKIN OFF  (PAPER TAPE IS FINE)     REVIEW OF SYSTEMS:  [X]  denotes positive finding, [ ]  denotes negative finding Cardiac  Comments:  Chest pain or chest pressure:    Shortness of breath upon exertion:    Short of breath when lying flat:    Irregular heart rhythm:        Vascular    Pain in calf, thigh, or hip brought on by ambulation:    Pain in feet at night that wakes you up from your sleep:     Blood clot in your veins:    Leg swelling:         Pulmonary    Oxygen  at home:    Productive cough:     Wheezing:         Neurologic    Sudden weakness in arms or legs:      Sudden numbness in arms or legs:     Sudden onset of difficulty speaking or slurred speech:    Temporary loss of vision in one eye:     Problems with dizziness:         Gastrointestinal    Blood in stool:     Vomited blood:         Genitourinary    Burning when urinating:     Blood in urine:        Psychiatric    Major depression:         Hematologic    Bleeding problems:  Problems with blood clotting too easily:        Skin    Rashes or ulcers:        Constitutional    Fever or chills:      PHYSICAL EXAMINATION:  There were no vitals filed for this visit.   General:  WDWN in NAD; vital signs documented above, appears older than stated age Gait: Not observed HENT: WNL, normocephalic Pulmonary: normal non-labored breathing  Cardiac: regular HR Abdomen: soft, NT, no masses Skin: without rashes Vascular Exam/Pulses:  Right Left  Radial 2+ (normal) 2+ (normal)  Ulnar    Femoral    Popliteal    DP absent absent  PT     Extremities: without ischemic changes, without Gangrene , without cellulitis; without open wounds;  Musculoskeletal: no muscle wasting or atrophy  Neurologic: A&O X 3;  No focal weakness or paresthesias are detected Psychiatric:  The pt has Normal affect.   Non-Invasive Vascular Imaging:     +-------+-----------+-----------+------------+------------+  ABI/TBIToday's ABIToday's TBIPrevious ABIPrevious TBI  +-------+-----------+-----------+------------+------------+  Right 1.07       0.76       1.05        1.31          +-------+-----------+-----------+------------+------------+  Left  1.09       0.97       1.23        1.14          +-------+-----------+-----------+------------+------------+    Abdominal Aorta Findings:  +-----------+-------+----------+----------+--------+--------+--------+  Location  AP (cm)Trans (cm)PSV (cm/s)WaveformThrombusComments   +-----------+-------+----------+----------+--------+--------+--------+  Proximal  2.23   2.34      37                                  +-----------+-------+----------+----------+--------+--------+--------+  Mid       2.24   2.56      31                                  +-----------+-------+----------+----------+--------+--------+--------+  Distal    2.77   3.37      35                                  +-----------+-------+----------+----------+--------+--------+--------+  RT CIA Prox1.3    1.2       118       biphasic                  +-----------+-------+----------+----------+--------+--------+--------+  LT CIA Prox1.4    1.6       123       biphasic                  +-----------+-------+----------+----------+--------+--------+--------+    ASSESSMENT/PLAN: Erin [name] is a 63 y.o. female presenting with history of 3.3 cm AAA.    On exam, Erin [name]  remains asymptomatic.  The aneurysm is unchanged over the last 6 months.  I independently reviewed the CT angio abdomen pelvis from August 30, 2023, as well as a previous CT scan performed in June 2020.  There is been 11 mm of growth over the last 5 years.  The small aneurysm does have some features that are atypical.  There appears to be 2 walls present, as well as an area of ulceration.  Possibly a previous intramural  hematoma that has degenerated.  Also noted are bilateral accessory renal arteries.  Kidneys appeared mildly abnormal.  Small iliac system bilaterally.    My plan is to treat this like a normal infrarenal abdominal aneurysm from a size standpoint.  My plan is to see her back in the office in 2 years time.   Carotid artery disease is being followed by Dr. Burnadette am happy to take over this when she is seen in 2 years.  She is scheduled for repeat carotid duplex ultrasound at her next cardiology visit.  Fonda FORBES Rim, MD [facility] [phone]

## 2024-03-07 ENCOUNTER — Encounter (HOSPITAL_COMMUNITY): Payer: Self-pay | Admitting: Vascular Surgery

## 2024-03-07 ENCOUNTER — Ambulatory Visit: Admitting: Vascular Surgery

## 2024-03-07 ENCOUNTER — Ambulatory Visit (HOSPITAL_COMMUNITY): Admission: RE | Admit: 2024-03-07 | Discharge: 2024-03-07 | Attending: Vascular Surgery

## 2024-03-07 ENCOUNTER — Ambulatory Visit (HOSPITAL_COMMUNITY)
Admission: RE | Admit: 2024-03-07 | Discharge: 2024-03-07 | Disposition: A | Discharge status: Home / Self Care | Source: Ambulatory Visit | Attending: Surgery | Admitting: Surgery

## 2024-03-07 ENCOUNTER — Encounter: Payer: Self-pay | Admitting: Vascular Surgery

## 2024-03-07 VITALS — BP 111/74 | HR 69 | Temp 98.0°F | Resp 18 | Ht 60.0 in | Wt 116.3 lb

## 2024-03-07 DIAGNOSIS — I7143 Infrarenal abdominal aortic aneurysm, without rupture: Secondary | ICD-10-CM

## 2024-03-07 DIAGNOSIS — I739 Peripheral vascular disease, unspecified: Secondary | ICD-10-CM

## 2024-03-07 DIAGNOSIS — I6529 Occlusion and stenosis of unspecified carotid artery: Secondary | ICD-10-CM | POA: Insufficient documentation

## 2024-03-07 LAB — VAS US ABI WITH/WO TBI
Left ABI: 1.09
Right ABI: 1.07

## 2024-03-09 ENCOUNTER — Other Ambulatory Visit: Payer: Self-pay | Admitting: Family Medicine

## 2024-03-09 DIAGNOSIS — G6289 Other specified polyneuropathies: Secondary | ICD-10-CM

## 2024-03-11 ENCOUNTER — Encounter: Payer: Self-pay | Admitting: Radiology

## 2024-03-12 ENCOUNTER — Other Ambulatory Visit: Payer: Self-pay | Admitting: Family Medicine

## 2024-03-12 DIAGNOSIS — G6289 Other specified polyneuropathies: Secondary | ICD-10-CM

## 2024-03-14 ENCOUNTER — Ambulatory Visit

## 2024-03-14 VITALS — BP 107/68 | Ht 60.0 in | Wt 118.0 lb

## 2024-03-14 DIAGNOSIS — Z0000 Encounter for general adult medical examination without abnormal findings: Secondary | ICD-10-CM

## 2024-03-14 DIAGNOSIS — Z1231 Encounter for screening mammogram for malignant neoplasm of breast: Secondary | ICD-10-CM

## 2024-03-14 NOTE — Progress Notes (Signed)
 Subjective:   Honey  Erin [name] is a 63 y.o. female who presents for a Medicare Annual Wellness Visit.  Allergies (verified) Alpha-gal, Other, Bovine (beef) protein-containing drug products, Chantix  [varenicline ], Porcine (pork) protein-containing drug products, Reglan  [metoclopramide ], and Tape   History: Past Medical History:  Diagnosis Date   AICD (automatic cardioverter/defibrillator) present    Allergic reaction to alpha-gal    Allergy 03/05/2022   Anemia    Anxiety state 03/25/2016   Arthritis    Basal cell carcinoma of forehead    Brain aneurysm    CHF (congestive heart failure) (HCC) 03/11/2016   Coronary artery disease    a. 03/11/16 PCI with DES-->Prox/Mid Cx;  b. 03/14/16 PCI with DES x2-->RCA, EF 30-35%.   Depression 06/09/2010   Encounter for general adult medical examination with abnormal findings 05/04/2022   Essential hypertension    Hx   GERD (gastroesophageal reflux disease)    HFrEF (heart failure with reduced ejection fraction) (HCC)    a. 10/2016 Echo: EF 35-40%, Gr1 DD, mild focal basal septal hypertrophy, basal inflat, mid inflat, basal antlat AK. Mid infept/inf/antlat, apical lateral sev HK. Mod MR. mildly reduced RV fxn. Mild TR.   History of pneumonia    Hyperlipidemia    IBS (irritable bowel syndrome)    Ischemic cardiomyopathy    a. 10/2016 Echo: EF 35-40%, Gr1 DD.   Lung cancer (HCC) 2024   Mitral regurgitation    Neuromuscular disorder (HCC)    NSTEMI (non-ST elevated myocardial infarction) (HCC) 03/10/2016   Peripheral vascular disease    Pneumonia 03/2016   Shingles    Squamous cell cancer of skin of nose    Thrombocytosis 03/26/2016   Tobacco abuse    Trichimoniasis    Wears dentures    Wears glasses    Past Surgical History:  Procedure Laterality Date   APPENDECTOMY  1982   BICEPT TENODESIS Left 03/21/2023   Procedure: BICEPS TENODESIS;  Surgeon: Addie Cordella Hamilton, MD;  Location: [facility] OR;  Service: Orthopedics;  Laterality: Left;    BIOPSY  09/20/2018   Procedure: BIOPSY;  Surgeon: Shaaron Lamar HERO, MD;  Location: [facility] ENDO [facility];  Service: Endoscopy;;  colon   BIOPSY  01/05/2021   Procedure: BIOPSY;  Surgeon: Teressa Toribio SQUIBB, MD;  Location: [facility] ENDOSCOPY;  Service: Endoscopy;;   BIOPSY  04/27/2022   Procedure: BIOPSY;  Surgeon: Cindie Carlin POUR, DO;  Location: [facility];  Service: Endoscopy;;   BRONCHIAL BIOPSY  07/03/2023   Procedure: BRONCHIAL BIOPSIES;  Surgeon: Shelah Lamar RAMAN, MD;  Location: [facility] ENDOSCOPY;  Service: Pulmonary;;   BRONCHIAL BRUSHINGS  07/03/2023   Procedure: BRONCHIAL BRUSHINGS;  Surgeon: Shelah Lamar RAMAN, MD;  Location: [facility] ENDOSCOPY;  Service: Pulmonary;;   BRONCHIAL NEEDLE ASPIRATION BIOPSY  07/03/2023   Procedure: BRONCHIAL NEEDLE ASPIRATION BIOPSIES;  Surgeon: Shelah Lamar RAMAN, MD;  Location: [facility] ENDOSCOPY;  Service: Pulmonary;;   BRONCHIAL WASHINGS  07/03/2023   Procedure: BRONCHIAL WASHINGS;  Surgeon: Shelah Lamar RAMAN, MD;  Location: [facility] ENDOSCOPY;  Service: Pulmonary;;   CARDIAC CATHETERIZATION N/A 03/11/2016   Procedure: Left Heart Cath and Coronary Angiography;  Surgeon: Alm LELON Clay, MD;  Location: [facility] INVASIVE CV LAB;  Service: Cardiovascular;  Laterality: N/A;   CARDIAC CATHETERIZATION N/A 03/11/2016   Procedure: Coronary Stent Intervention;  Surgeon: Alm LELON Clay, MD;  Location: [facility] INVASIVE CV LAB;  Service: Cardiovascular;  Laterality: N/A;   CARDIAC CATHETERIZATION N/A 03/14/2016   Procedure: Coronary Stent Intervention;  Surgeon: Peter M Jordan, MD;  Location: [facility] INVASIVE CV LAB;  Service: Cardiovascular;  Laterality: N/A;   CEREBRAL ANEURYSM REPAIR  04/2019   stent placed   CHOLECYSTECTOMY OPEN  1984   COLONOSCOPY N/A 08/28/2023   Procedure: COLONOSCOPY;  Surgeon: Cindie Carlin POUR, DO;  [facility]: [facility];  Service: Endoscopy;  Laterality: N/A;  1030am, asa 3, has defibrillator   COLONOSCOPY WITH PROPOFOL  N/A 09/20/2018   Procedure: COLONOSCOPY WITH PROPOFOL ;  Surgeon:  Shaaron Lamar HERO, MD;  [facility]: [facility];  Service: Endoscopy;  Laterality: N/A;  10:30am   CORONARY ANGIOPLASTY WITH STENT PLACEMENT  03/14/2016   ESOPHAGOGASTRODUODENOSCOPY (EGD) WITH PROPOFOL  N/A 09/20/2018   Procedure: ESOPHAGOGASTRODUODENOSCOPY (EGD) WITH PROPOFOL ;  Surgeon: Shaaron Lamar HERO, MD;  [facility];  Service: Endoscopy;  Laterality: N/A;   ESOPHAGOGASTRODUODENOSCOPY (EGD) WITH PROPOFOL  N/A 01/05/2021   Procedure: ESOPHAGOGASTRODUODENOSCOPY (EGD) WITH PROPOFOL ;  Surgeon: Teressa Toribio SQUIBB, MD;  Location: [facility] ENDOSCOPY;  Service: Endoscopy;  Laterality: N/A;   ESOPHAGOGASTRODUODENOSCOPY (EGD) WITH PROPOFOL  N/A 04/27/2022   Procedure: ESOPHAGOGASTRODUODENOSCOPY (EGD) WITH PROPOFOL ;  Surgeon: Cindie Carlin POUR, DO;  [facility];  Service: Endoscopy;  Laterality: N/A;  9:15am, asa 3/4, ASAP   ESOPHAGOGASTRODUODENOSCOPY (EGD) WITH PROPOFOL  N/A 01/04/2023   Procedure: ESOPHAGOGASTRODUODENOSCOPY (EGD) WITH PROPOFOL ;  Surgeon: Shaaron Lamar HERO, MD;  [facility];  Service: Endoscopy;  Laterality: N/A;  130pm, asa 3   FIDUCIAL MARKER PLACEMENT  07/03/2023   Procedure: FIDUCIAL MARKER PLACEMENT;  Surgeon: Shelah Lamar RAMAN, MD;  Location: [facility] ENDOSCOPY;  Service: Pulmonary;;   FINGER ARTHROPLASTY Left 05/14/2013   Procedure: LEFT THUMB CARPAL METACARPAL ARTHROPLASTY;  Surgeon: Franky JONELLE Curia, MD;  Location:  SURGERY CENTER;  Service: Orthopedics;  Laterality: Left;   ICD IMPLANT N/A 04/03/2020   Procedure: ICD IMPLANT;  Surgeon: Inocencio Soyla Lunger, MD;  Location: [facility] INVASIVE CV LAB;  Service: Cardiovascular;  Laterality: N/A;   INTERCOSTAL NERVE BLOCK Left 08/31/2023   Procedure: BLOCK, NERVE, INTERCOSTAL;  Surgeon: Shyrl Linnie KIDD, MD;  Location: [facility] OR;  Service: Thoracic;  Laterality: Left;   IR ANGIO INTRA EXTRACRAN SEL COM CAROTID INNOMINATE BILAT MOD SED  01/05/2017   IR ANGIO INTRA EXTRACRAN SEL COM CAROTID INNOMINATE BILAT MOD SED   03/19/2019   IR ANGIO INTRA EXTRACRAN SEL COM CAROTID INNOMINATE BILAT MOD SED  06/04/2020   IR ANGIO INTRA EXTRACRAN SEL INTERNAL CAROTID UNI L MOD SED  04/15/2019   IR ANGIO VERTEBRAL SEL VERTEBRAL BILAT MOD SED  01/05/2017   IR ANGIO VERTEBRAL SEL VERTEBRAL BILAT MOD SED  03/19/2019   IR ANGIO VERTEBRAL SEL VERTEBRAL UNI L MOD SED  06/04/2020   IR ANGIOGRAM FOLLOW UP STUDY  04/15/2019   IR RADIOLOGIST EVAL & MGMT  12/30/2016   IR TRANSCATH/EMBOLIZ  04/15/2019   IR US  GUIDE VASC ACCESS RIGHT  03/19/2019   IR US  GUIDE VASC ACCESS RIGHT  06/04/2020   JOINT REPLACEMENT  03/21/2023   LOBECTOMY, LUNG, ROBOT-ASSISTED, USING VATS Left 08/31/2023   Procedure: LOBECTOMY, LEFT UPPER LUNG, ROBOT-ASSISTED, USING VATS;  Surgeon: Shyrl Linnie KIDD, MD;  Location: [facility] OR;  Service: Thoracic;  Laterality: Left;  Left Robotic Assisted Thoracoscopy for Left Upper Lobectomy   LYMPH NODE BIOPSY Left 08/31/2023   Procedure: LYMPH NODE BIOPSY;  Surgeon: Shyrl Linnie KIDD, MD;  Location: [facility] OR;  Service: Thoracic;  Laterality: Left;   MALONEY DILATION N/A 09/20/2018   Procedure: AGAPITO HODGKIN;  Surgeon: Shaaron Lamar HERO, MD;  [facility];  Service: Endoscopy;  Laterality: N/A;   MALONEY DILATION N/A 01/04/2023   Procedure: AGAPITO DILATION;  Surgeon: Shaaron Lamar HERO, MD;  [facility];  Service: Endoscopy;  Laterality: N/A;   RADIOLOGY WITH ANESTHESIA N/A 04/15/2019   Procedure: BARBARANN;  Surgeon: Dolphus Carrion, MD;  Location: [facility] OR;  Service: Radiology;  Laterality: N/A;   REVERSE SHOULDER ARTHROPLASTY Left 03/21/2023   Procedure: LEFT REVERSE SHOULDER ARTHROPLASTY;  Surgeon: Addie Cordella Hamilton, MD;  Location: [facility] OR;  Service: Orthopedics;  Laterality: Left;   RIGHT/LEFT HEART CATH AND CORONARY ANGIOGRAPHY N/A 08/19/2019   Procedure: RIGHT/LEFT HEART CATH AND CORONARY ANGIOGRAPHY;  Surgeon: Rolan Ezra RAMAN, MD;  Location: [facility] INVASIVE CV LAB;  Service: Cardiovascular;   Laterality: N/A;   TUBAL LIGATION  1987   VAGINAL HYSTERECTOMY  2009   Family History  Problem Relation Age of Onset   Stroke Mother    Hypertension Mother    Diabetes Mother    Heart attack Mother    Heart attack Father    Diabetes Father    Hypertension Father    CAD Father    Colon polyps Father 75       pre-cancerous    Stroke Father    Dementia Father    Hyperlipidemia Father    Arthritis Father    COPD Father        smoked and worked [employer]   Heart disease Father    Breast cancer Maternal Grandmother    Diabetes Maternal Grandmother    Cancer Maternal Grandfather        Tongue and esophageal   Cancer Paternal Grandmother    Anxiety disorder Daughter    Depression Daughter    Anxiety disorder Daughter    Heart failure Other    Colon cancer Neg Hx    Social History   Occupational History   Occupation: CNA  Tobacco Use   Smoking status: Former    Current packs/day: 1.50    Average packs/day: 1.5 packs/day for 40.0 years (60.0 ttl pk-yrs)    Types: Cigarettes   Smokeless tobacco: Never   Tobacco comments:    Smokes off and on 06/14/23 Sonny Lecher, CMA     No cigarettes since Thursday this week>> 07/06/2023  Vaping Use   Vaping status: Never Used  Substance and Sexual Activity   Alcohol use: Not Currently    Comment: occasionally   Drug use: Not Currently    Types: Marijuana   Sexual activity: Not Currently    Birth control/protection: Surgical    Comment: hyst   Tobacco Counseling Counseling given: Yes Tobacco comments: Smokes off and on 06/14/23 Sonny Lecher, CMA  No cigarettes since Thursday this week>> 07/06/2023  SDOH Screenings   Food Insecurity: Patient Declined (03/11/2024)  Housing: Low Risk  (03/11/2024)  Transportation Needs: No Transportation Needs (03/11/2024)  Utilities: Not At Risk (03/14/2024)  Alcohol Screen: Low Risk  (06/11/2021)  Depression (PHQ2-9): Medium Risk (07/26/2023)  Financial Resource Strain: Low Risk  (03/11/2024)   Physical Activity: Insufficiently Active (03/11/2024)  Social Connections: Socially Isolated (03/11/2024)  Stress: Stress Concern Present (03/11/2024)  Tobacco Use: Medium Risk (03/14/2024)  Health Literacy: Adequate Health Literacy (03/14/2024)   Depression Screen    07/26/2023   12:45 PM 06/01/2023    3:53 PM 04/20/2023    8:59 AM 04/26/2022    1:19 PM 06/11/2021    8:32 AM 11/27/2017    3:32 PM 04/19/2016   10:12 AM  PHQ 2/9 Scores  PHQ - 2 Score 3  0 0 2 2 1 5   PHQ- 9 Score 7  0  0  10  11   20    Exception Documentation       --     Data saved with a previous flowsheet row definition     Goals Addressed   None    Visit info / Clinical Intake: Interpreter Needed?: No  Functional Status Activities of Daily Living (to include ambulation/medication): (Patient-Rptd) Independent Ambulation: (Patient-Rptd) Independent Medication Administration: Independent Home Management: (Patient-Rptd) Independent  Fall Screening Falls in the past year?: (Patient-Rptd) 0 Number of falls in past year: (Patient-Rptd) 0 Was there an injury with Fall?: (Patient-Rptd) 0 Fall Risk Category Calculator: (Patient-Rptd) 0 Patient Fall Risk Level: (Patient-Rptd) Low Fall Risk  Fall Risk Patient at Risk for Falls Due to: No Fall Risks Fall risk Follow up: Falls evaluation completed  Advance Directives (For Healthcare) Does Patient Have a Medical Advance Directive?: No Would patient like information on creating a medical advance directive?: No - Patient declined        Objective:    Today's Vitals   03/14/24 1453  BP: 107/68  Weight: 118 lb (53.5 kg)  Height: 5' (1.524 m)   Body mass index is 23.05 kg/m.  Current Medications (verified) Outpatient Encounter Medications as of 03/14/2024  Medication Sig   aspirin  EC 81 MG tablet Take 1 tablet (81 mg total) by mouth every evening. Okay to restart on 2/29/2025   atorvastatin  (LIPITOR ) 40 MG tablet Take 1 tablet (40 mg total) by mouth daily.    clopidogrel  (PLAVIX ) 75 MG tablet Take 1 tablet by mouth once daily   EPINEPHrine  (EPIPEN  2-PAK) 0.3 mg/0.3 mL IJ SOAJ injection Inject 0.3 mg into the muscle as needed for anaphylaxis.   ezetimibe  (ZETIA ) 10 MG tablet Take 1 tablet (10 mg total) by mouth daily.   FARXIGA  10 MG TABS tablet Take 1 tablet (10 mg total) by mouth daily before breakfast.   fenofibrate  (TRICOR ) 145 MG tablet Take 1 tablet by mouth once daily   furosemide  (LASIX ) 20 MG tablet Take 2 tablets (40 mg total) by mouth daily.   gabapentin  (NEURONTIN ) 300 MG capsule Take 1 capsule by mouth at bedtime   linaclotide  (LINZESS ) 145 MCG CAPS capsule Take 1 capsule (145 mcg total) by mouth daily before breakfast.   metoprolol  succinate (TOPROL  XL) 25 MG 24 hr tablet Take 0.5 tablets (12.5 mg total) by mouth at bedtime.   Multiple Vitamins-Minerals (MULTIVITAMIN WITH MINERALS) tablet Take 1 tablet by mouth daily.   nicotine  (NICODERM CQ  - DOSED IN MG/24 HOURS) 21 mg/24hr patch Place 21 mg onto the skin daily.   nitroGLYCERIN  (NITROSTAT ) 0.4 MG SL tablet Place 1 tablet (0.4 mg total) under the tongue every 5 (five) minutes x 3 doses as needed for chest pain (if no relief after 2nd dose, proceed to the ED for an evaluation or call 911).   ondansetron  (ZOFRAN ) 8 MG tablet Take 1 tablet (8 mg total) by mouth every 8 (eight) hours as needed for nausea or vomiting.   pantoprazole  (PROTONIX ) 40 MG tablet TAKE 1 TABLET BY MOUTH TWICE DAILY BEFORE A MEAL   potassium chloride  SA (KLOR-CON  M) 20 MEQ tablet Take 1 tablet (20 mEq total) by mouth daily.   promethazine  (PHENERGAN ) 12.5 MG tablet TAKE 1 TABLET BY MOUTH EVERY 6 HOURS AS NEEDED FOR NAUSEA OR VOMITING   REPATHA  SURECLICK 140 MG/ML SOAJ INJECT 140 MG INTO THE SKIN EVERY 14 DAYS   rOPINIRole  (REQUIP ) 0.5  MG tablet Take 0.5 mg by mouth at bedtime.    sertraline  (ZOLOFT ) 25 MG tablet Take 1 tablet (25 mg total) by mouth daily.   traZODone  (DESYREL ) 50 MG tablet TAKE 1 TABLET BY MOUTH AT  BEDTIME   No facility-administered encounter medications on file as of 03/14/2024.   Hearing/Vision screen Hearing Screening - Comments:: Patient denies any hearing difficulties.   Vision Screening - Comments:: Wears rx glasses - up to date with routine eye exams with  [facility] in [place] Immunizations and Health Maintenance Health Maintenance  Topic Date Due   Medicare Annual Wellness (AWV)  Never done   COVID-19 Vaccine (1) Never done   Hepatitis C Screening  Never done   DTaP/Tdap/Td (1 - Tdap) Never done   Pneumococcal Vaccine: 50+ Years (2 of 2 - PCV) 08/05/2017   Zoster Vaccines- Shingrix (2 of 2) 11/07/2022   Mammogram  12/22/2022   Colonoscopy  08/27/2033   Influenza Vaccine  Completed   HIV Screening  Completed   Hepatitis B Vaccines 19-59 Average Risk  Aged Out   HPV VACCINES  Aged Out   Meningococcal B Vaccine  Aged Out   Lung Cancer Screening  Discontinued        Assessment/Plan:  This is a routine wellness examination for Rosaly .  Patient Care Team: Patient, No Pcp Per as PCP - General (General Practice) Rolan Ezra RAMAN, MD as PCP - Advanced Heart Failure (Cardiology) Inocencio Soyla Lunger, MD as PCP - Electrophysiology (Cardiology) Rolan Ezra RAMAN, MD as PCP - Cardiology (Cardiology) Shaaron Lamar HERO, MD as Consulting Physician (Gastroenterology) Prentis Duwaine BROCKS, RN as Oncology Nurse Navigator  I have personally reviewed and noted the following in the patient's chart:   Medical and social history Use of alcohol, tobacco or illicit drugs  Current medications and supplements including opioid prescriptions. Functional ability and status Nutritional status Physical activity Advanced directives List of other physicians Hospitalizations, surgeries, and ER visits in previous 12 months Vitals Screenings to include cognitive, depression, and falls Referrals and appointments  No orders of the defined types were placed in this encounter.  In addition,  I have reviewed and discussed with patient certain preventive protocols, quality metrics, and best practice recommendations. A written personalized care plan for preventive services as well as general preventive health recommendations were provided to patient.   Dammon Makarewicz, CMA   03/14/2024   No follow-ups on file.  After Visit Summary: (MyChart) Due to this being a telephonic visit, the after visit summary with patients personalized plan was offered to patient via MyChart   Nurse Notes: transfer of care appointment scheduled

## 2024-03-14 NOTE — Patient Instructions (Signed)
 Erin [name],  Thank you for taking the time for your Medicare Wellness Visit. I appreciate your continued commitment to your health goals. Please review the care plan we discussed, and feel free to reach out if I can assist you further.  Please note that Annual Wellness Visits do not include a physical exam. Some assessments may be limited, especially if the visit was conducted virtually. If needed, we may recommend an in-person follow-up with your provider.  Ongoing Care Seeing your primary care provider every 3 to 6 months helps us  monitor your health and provide consistent, personalized care.   Referrals If a referral was made during today's visit and you haven't received any updates within two weeks, please contact the referred provider directly to check on the status.  Recommended Screenings:  Health Maintenance  Topic Date Due   Medicare Annual Wellness Visit  Never done   COVID-19 Vaccine (1) Never done   Hepatitis C Screening  Never done   DTaP/Tdap/Td vaccine (1 - Tdap) Never done   Pneumococcal Vaccine for age over 45 (2 of 2 - PCV) 08/05/2017   Zoster (Shingles) Vaccine (2 of 2) 11/07/2022   Breast Cancer Screening  12/22/2022   Colon Cancer Screening  08/27/2033   Flu Shot  Completed   HIV Screening  Completed   Hepatitis B Vaccine  Aged Out   HPV Vaccine  Aged Out   Meningitis B Vaccine  Aged Out   Screening for Lung Cancer  Discontinued       01/09/2024   11:09 PM  Advanced Directives  Does Patient Have a Medical Advance Directive? No  Would patient like information on creating a medical advance directive? No - Patient declined    Vision: Annual vision screenings are recommended for early detection of glaucoma, cataracts, and diabetic retinopathy. These exams can also reveal signs of chronic conditions such as diabetes and high blood pressure.  Dental: Annual dental screenings help detect early signs of oral cancer, gum disease, and other conditions linked to  overall health, including heart disease and diabetes.  Please see the attached documents for additional preventive care recommendations.

## 2024-03-18 ENCOUNTER — Inpatient Hospital Stay: Attending: Internal Medicine | Admitting: Internal Medicine

## 2024-03-18 VITALS — BP 121/76 | HR 92 | Temp 97.8°F | Resp 17 | Ht 60.0 in | Wt 118.0 lb

## 2024-03-18 DIAGNOSIS — C3490 Malignant neoplasm of unspecified part of unspecified bronchus or lung: Secondary | ICD-10-CM

## 2024-03-18 DIAGNOSIS — Z85118 Personal history of other malignant neoplasm of bronchus and lung: Secondary | ICD-10-CM | POA: Diagnosis not present

## 2024-03-18 DIAGNOSIS — Z902 Acquired absence of lung [part of]: Secondary | ICD-10-CM | POA: Insufficient documentation

## 2024-03-18 NOTE — Progress Notes (Signed)
 [facility] [phone]   [phone]  OFFICE PROGRESS NOTE  No primary care provider on file. No primary provider on file.  DIAGNOSIS: Stage 1A (T1b, N0, M0) adenocarcinoma of the lung diagnosed in February 2025.   PRIOR THERAPY: Status post left upper lobectomy with lymph node sampling under the care of Dr. Shyrl on 08/31/2023  CURRENT THERAPY: Observation  INTERVAL HISTORY: Erin [name] 63 y.o. female returns to the clinic today for follow-up visit.Discussed the use of AI scribe software for clinical note transcription with the patient, who gave verbal consent to proceed.  History of Present Illness Erin [name] is a 62 year old female with stage one non-small cell lung cancer who presents for evaluation with repeat CT scan for restaging of her disease.  She was diagnosed with stage one non-small cell lung cancer in February 2025 and underwent a left upper lobectomy with lymph node dissection. She is currently in an observation period.  Over the past six months, she has experienced occasional shortness of breath, although her oxygen  saturation remains at 100%. No chest pain, hemoptysis, or significant weight loss is noted. Instead, she has gained weight.    MEDICAL HISTORY: Past Medical History:  Diagnosis Date   AICD (automatic cardioverter/defibrillator) present    Allergic reaction to alpha-gal    Allergy 03/05/2022   Anemia    Anxiety state 03/25/2016   Arthritis    Basal cell carcinoma of forehead    Brain aneurysm    CHF (congestive heart failure) (HCC) 03/11/2016   Coronary artery disease    a. 03/11/16 PCI with DES-->Prox/Mid Cx;  b. 03/14/16 PCI with DES x2-->RCA, EF 30-35%.   Depression 06/09/2010   Encounter for general adult medical examination with abnormal findings 05/04/2022   Essential hypertension    Hx   GERD (gastroesophageal reflux disease)    HFrEF (heart failure with reduced ejection fraction) (HCC)     a. 10/2016 Echo: EF 35-40%, Gr1 DD, mild focal basal septal hypertrophy, basal inflat, mid inflat, basal antlat AK. Mid infept/inf/antlat, apical lateral sev HK. Mod MR. mildly reduced RV fxn. Mild TR.   History of pneumonia    Hyperlipidemia    IBS (irritable bowel syndrome)    Ischemic cardiomyopathy    a. 10/2016 Echo: EF 35-40%, Gr1 DD.   Lung cancer (HCC) 2024   Mitral regurgitation    Neuromuscular disorder (HCC)    NSTEMI (non-ST elevated myocardial infarction) (HCC) 03/10/2016   Peripheral vascular disease    Pneumonia 03/2016   Shingles    Squamous cell cancer of skin of nose    Thrombocytosis 03/26/2016   Tobacco abuse    Trichimoniasis    Wears dentures    Wears glasses     ALLERGIES:  is allergic to alpha-gal, other, bovine (beef) protein-containing drug products, chantix  [varenicline ], porcine (pork) protein-containing drug products, reglan  [metoclopramide ], and tape.  MEDICATIONS:  Current Outpatient Medications  Medication Sig Dispense Refill   aspirin  EC 81 MG tablet Take 1 tablet (81 mg total) by mouth every evening. Okay to restart on 2/29/2025     atorvastatin  (LIPITOR ) 40 MG tablet Take 1 tablet (40 mg total) by mouth daily. 30 tablet 11   clopidogrel  (PLAVIX ) 75 MG tablet Take 1 tablet by mouth once daily 90 tablet 3   EPINEPHrine  (EPIPEN  2-PAK) 0.3 mg/0.3 mL IJ SOAJ injection Inject 0.3 mg into the muscle as needed for anaphylaxis. 2 each 2   ezetimibe  (ZETIA ) 10  MG tablet Take 1 tablet (10 mg total) by mouth daily. 90 tablet 3   FARXIGA  10 MG TABS tablet Take 1 tablet (10 mg total) by mouth daily before breakfast. 90 tablet 0   fenofibrate  (TRICOR ) 145 MG tablet Take 1 tablet by mouth once daily 30 tablet 11   furosemide  (LASIX ) 20 MG tablet Take 2 tablets (40 mg total) by mouth daily. 90 tablet 3   gabapentin  (NEURONTIN ) 300 MG capsule Take 1 capsule by mouth at bedtime 30 capsule 0   linaclotide  (LINZESS ) 145 MCG CAPS capsule Take 1 capsule (145 mcg total)  by mouth daily before breakfast. 30 capsule 11   metoprolol  succinate (TOPROL  XL) 25 MG 24 hr tablet Take 0.5 tablets (12.5 mg total) by mouth at bedtime. 90 tablet 3   Multiple Vitamins-Minerals (MULTIVITAMIN WITH MINERALS) tablet Take 1 tablet by mouth daily.     nicotine  (NICODERM CQ  - DOSED IN MG/24 HOURS) 21 mg/24hr patch Place 21 mg onto the skin daily.     nitroGLYCERIN  (NITROSTAT ) 0.4 MG SL tablet Place 1 tablet (0.4 mg total) under the tongue every 5 (five) minutes x 3 doses as needed for chest pain (if no relief after 2nd dose, proceed to the ED for an evaluation or call 911). 25 tablet 2   ondansetron  (ZOFRAN ) 8 MG tablet Take 1 tablet (8 mg total) by mouth every 8 (eight) hours as needed for nausea or vomiting. 90 tablet 3   pantoprazole  (PROTONIX ) 40 MG tablet TAKE 1 TABLET BY MOUTH TWICE DAILY BEFORE A MEAL 60 tablet 5   potassium chloride  SA (KLOR-CON  M) 20 MEQ tablet Take 1 tablet (20 mEq total) by mouth daily. 90 tablet 3   promethazine  (PHENERGAN ) 12.5 MG tablet TAKE 1 TABLET BY MOUTH EVERY 6 HOURS AS NEEDED FOR NAUSEA OR VOMITING 20 tablet 0   REPATHA  SURECLICK 140 MG/ML SOAJ INJECT 140 MG INTO THE SKIN EVERY 14 DAYS 6 mL 0   rOPINIRole  (REQUIP ) 0.5 MG tablet Take 0.5 mg by mouth at bedtime.   0   sertraline  (ZOLOFT ) 25 MG tablet Take 1 tablet (25 mg total) by mouth daily. 30 tablet 0   traZODone  (DESYREL ) 50 MG tablet TAKE 1 TABLET BY MOUTH AT BEDTIME 90 tablet 0   No current facility-administered medications for this visit.    SURGICAL HISTORY:  Past Surgical History:  Procedure Laterality Date   APPENDECTOMY  1982   BICEPT TENODESIS Left 03/21/2023   Procedure: BICEPS TENODESIS;  Surgeon: Addie Cordella Hamilton, MD;  Location: [facility] OR;  Service: Orthopedics;  Laterality: Left;   BIOPSY  09/20/2018   Procedure: BIOPSY;  Surgeon: Shaaron Lamar HERO, MD;  Location: [facility] ENDO [facility];  Service: Endoscopy;;  colon   BIOPSY  01/05/2021   Procedure: BIOPSY;  Surgeon: Teressa Toribio SQUIBB,  MD;  Location: [facility] ENDOSCOPY;  Service: Endoscopy;;   BIOPSY  04/27/2022   Procedure: BIOPSY;  Surgeon: Cindie Carlin POUR, DO;  Location: [facility];  Service: Endoscopy;;   BRONCHIAL BIOPSY  07/03/2023   Procedure: BRONCHIAL BIOPSIES;  Surgeon: Shelah Lamar RAMAN, MD;  Location: [facility] ENDOSCOPY;  Service: Pulmonary;;   BRONCHIAL BRUSHINGS  07/03/2023   Procedure: BRONCHIAL BRUSHINGS;  Surgeon: Shelah Lamar RAMAN, MD;  Location: [facility] ENDOSCOPY;  Service: Pulmonary;;   BRONCHIAL NEEDLE ASPIRATION BIOPSY  07/03/2023   Procedure: BRONCHIAL NEEDLE ASPIRATION BIOPSIES;  Surgeon: Shelah Lamar RAMAN, MD;  Location: [facility] ENDOSCOPY;  Service: Pulmonary;;   BRONCHIAL WASHINGS  07/03/2023   Procedure: BRONCHIAL WASHINGS;  Surgeon: Shelah Lamar RAMAN, MD;  Location: [facility] ENDOSCOPY;  Service: Pulmonary;;   CARDIAC CATHETERIZATION N/A 03/11/2016   Procedure: Left Heart Cath and Coronary Angiography;  Surgeon: Alm LELON Clay, MD;  Location: [facility] INVASIVE CV LAB;  Service: Cardiovascular;  Laterality: N/A;   CARDIAC CATHETERIZATION N/A 03/11/2016   Procedure: Coronary Stent Intervention;  Surgeon: Alm LELON Clay, MD;  Location: [facility] INVASIVE CV LAB;  Service: Cardiovascular;  Laterality: N/A;   CARDIAC CATHETERIZATION N/A 03/14/2016   Procedure: Coronary Stent Intervention;  Surgeon: Peter M Jordan, MD;  Location: [facility] INVASIVE CV LAB;  Service: Cardiovascular;  Laterality: N/A;   CEREBRAL ANEURYSM REPAIR  04/2019   stent placed   CHOLECYSTECTOMY OPEN  1984   COLONOSCOPY N/A 08/28/2023   Procedure: COLONOSCOPY;  Surgeon: Cindie Carlin POUR, DO;  [facility]: [facility];  Service: Endoscopy;  Laterality: N/A;  1030am, asa 3, has defibrillator   COLONOSCOPY WITH PROPOFOL  N/A 09/20/2018   Procedure: COLONOSCOPY WITH PROPOFOL ;  Surgeon: Shaaron Lamar HERO, MD;  [facility];  Service: Endoscopy;  Laterality: N/A;  10:30am   CORONARY ANGIOPLASTY WITH STENT PLACEMENT  03/14/2016   ESOPHAGOGASTRODUODENOSCOPY (EGD) WITH PROPOFOL   N/A 09/20/2018   Procedure: ESOPHAGOGASTRODUODENOSCOPY (EGD) WITH PROPOFOL ;  Surgeon: Shaaron Lamar HERO, MD;  [facility];  Service: Endoscopy;  Laterality: N/A;   ESOPHAGOGASTRODUODENOSCOPY (EGD) WITH PROPOFOL  N/A 01/05/2021   Procedure: ESOPHAGOGASTRODUODENOSCOPY (EGD) WITH PROPOFOL ;  Surgeon: Teressa Toribio SQUIBB, MD;  Location: [facility] ENDOSCOPY;  Service: Endoscopy;  Laterality: N/A;   ESOPHAGOGASTRODUODENOSCOPY (EGD) WITH PROPOFOL  N/A 04/27/2022   Procedure: ESOPHAGOGASTRODUODENOSCOPY (EGD) WITH PROPOFOL ;  Surgeon: Cindie Carlin POUR, DO;  [facility];  Service: Endoscopy;  Laterality: N/A;  9:15am, asa 3/4, ASAP   ESOPHAGOGASTRODUODENOSCOPY (EGD) WITH PROPOFOL  N/A 01/04/2023   Procedure: ESOPHAGOGASTRODUODENOSCOPY (EGD) WITH PROPOFOL ;  Surgeon: Shaaron Lamar HERO, MD;  [facility];  Service: Endoscopy;  Laterality: N/A;  130pm, asa 3   FIDUCIAL MARKER PLACEMENT  07/03/2023   Procedure: FIDUCIAL MARKER PLACEMENT;  Surgeon: Shelah Lamar RAMAN, MD;  Location: [facility] ENDOSCOPY;  Service: Pulmonary;;   FINGER ARTHROPLASTY Left 05/14/2013   Procedure: LEFT THUMB CARPAL METACARPAL ARTHROPLASTY;  Surgeon: Franky JONELLE Curia, MD;  Location: [facility];  Service: Orthopedics;  Laterality: Left;   ICD IMPLANT N/A 04/03/2020   Procedure: ICD IMPLANT;  Surgeon: Inocencio Soyla Lunger, MD;  Location: [facility] INVASIVE CV LAB;  Service: Cardiovascular;  Laterality: N/A;   INTERCOSTAL NERVE BLOCK Left 08/31/2023   Procedure: BLOCK, NERVE, INTERCOSTAL;  Surgeon: Shyrl Linnie KIDD, MD;  Location: [facility] OR;  Service: Thoracic;  Laterality: Left;   IR ANGIO INTRA EXTRACRAN SEL COM CAROTID INNOMINATE BILAT MOD SED  01/05/2017   IR ANGIO INTRA EXTRACRAN SEL COM CAROTID INNOMINATE BILAT MOD SED  03/19/2019   IR ANGIO INTRA EXTRACRAN SEL COM CAROTID INNOMINATE BILAT MOD SED  06/04/2020   IR ANGIO INTRA EXTRACRAN SEL INTERNAL CAROTID UNI L MOD SED  04/15/2019   IR ANGIO VERTEBRAL SEL VERTEBRAL  BILAT MOD SED  01/05/2017   IR ANGIO VERTEBRAL SEL VERTEBRAL BILAT MOD SED  03/19/2019   IR ANGIO VERTEBRAL SEL VERTEBRAL UNI L MOD SED  06/04/2020   IR ANGIOGRAM FOLLOW UP STUDY  04/15/2019   IR RADIOLOGIST EVAL & MGMT  12/30/2016   IR TRANSCATH/EMBOLIZ  04/15/2019   IR US  GUIDE VASC ACCESS RIGHT  03/19/2019   IR US  GUIDE VASC ACCESS RIGHT  06/04/2020   JOINT REPLACEMENT  03/21/2023   LOBECTOMY, LUNG,  ROBOT-ASSISTED, USING VATS Left 08/31/2023   Procedure: LOBECTOMY, LEFT UPPER LUNG, ROBOT-ASSISTED, USING VATS;  Surgeon: Shyrl Linnie KIDD, MD;  Location: [facility] OR;  Service: Thoracic;  Laterality: Left;  Left Robotic Assisted Thoracoscopy for Left Upper Lobectomy   LYMPH NODE BIOPSY Left 08/31/2023   Procedure: LYMPH NODE BIOPSY;  Surgeon: Shyrl Linnie KIDD, MD;  Location: [facility] OR;  Service: Thoracic;  Laterality: Left;   MALONEY DILATION N/A 09/20/2018   Procedure: AGAPITO DILATION;  Surgeon: Shaaron Lamar HERO, MD;  [facility];  Service: Endoscopy;  Laterality: N/A;   MALONEY DILATION N/A 01/04/2023   Procedure: AGAPITO DILATION;  Surgeon: Shaaron Lamar HERO, MD;  Location[facility];  Service: Endoscopy;  Laterality: N/A;   RADIOLOGY WITH ANESTHESIA N/A 04/15/2019   Procedure: BARBARANN;  Surgeon: Dolphus Carrion, MD;  Location: [facility] OR;  Service: Radiology;  Laterality: N/A;   REVERSE SHOULDER ARTHROPLASTY Left 03/21/2023   Procedure: LEFT REVERSE SHOULDER ARTHROPLASTY;  Surgeon: Addie Cordella Hamilton, MD;  Location: [facility] OR;  Service: Orthopedics;  Laterality: Left;   RIGHT/LEFT HEART CATH AND CORONARY ANGIOGRAPHY N/A 08/19/2019   Procedure: RIGHT/LEFT HEART CATH AND CORONARY ANGIOGRAPHY;  Surgeon: Rolan Ezra RAMAN, MD;  Location: [facility] INVASIVE CV LAB;  Service: Cardiovascular;  Laterality: N/A;   TUBAL LIGATION  1987   VAGINAL HYSTERECTOMY  2009    REVIEW OF SYSTEMS:  A comprehensive review of systems was negative except for: Respiratory: positive for dyspnea on exertion    PHYSICAL EXAMINATION: General appearance: alert, cooperative, fatigued, and no distress Head: Normocephalic, without obvious abnormality, atraumatic Neck: no adenopathy, no JVD, supple, symmetrical, trachea midline, and thyroid  not enlarged, symmetric, no tenderness/mass/nodules Lymph nodes: Cervical, supraclavicular, and axillary nodes normal. Resp: clear to auscultation bilaterally Back: symmetric, no curvature. ROM normal. No CVA tenderness. Cardio: regular rate and rhythm, S1, S2 normal, no murmur, click, rub or gallop GI: soft, non-tender; bowel sounds normal; no masses,  no organomegaly Extremities: extremities normal, atraumatic, no cyanosis or edema  ECOG PERFORMANCE STATUS: 1 - Symptomatic but completely ambulatory  Blood pressure 121/76, pulse 92, temperature 97.8 F (36.6 C), temperature source Temporal, resp. rate 17, height 5' (1.524 m), weight 118 lb (53.5 kg), SpO2 99%.  LABORATORY DATA: Lab Results  Component Value Date   WBC 8.7 03/01/2024   HGB 11.3 (L) 03/01/2024   HCT 34.1 (L) 03/01/2024   MCV 79.1 (L) 03/01/2024   PLT 381 03/01/2024      Chemistry      Component Value Date/Time   NA 140 03/01/2024 1319   NA 134 06/30/2022 0815   K 3.9 03/01/2024 1319   CL 106 03/01/2024 1319   CO2 28 03/01/2024 1319   BUN 21 03/01/2024 1319   BUN 25 06/30/2022 0815   CREATININE 1.77 (H) 03/01/2024 1319   CREATININE 0.73 05/13/2016 1020      Component Value Date/Time   CALCIUM  9.7 03/01/2024 1319   ALKPHOS 45 03/01/2024 1319   AST 18 03/01/2024 1319   ALT 9 03/01/2024 1319   BILITOT 0.3 03/01/2024 1319       RADIOGRAPHIC STUDIES: VAS US  ABI WITH/WO TBI Result Date: 03/07/2024  LOWER EXTREMITY DOPPLER STUDY Patient Name:  Yina  JINNY Mania  Date of Exam:   03/07/2024 Medical Rec #: [id number]        Accession #:    [id number] Date of Birth: [birth year]       Patient Gender: F Patient Age:   45 years Exam Location:  [address] Procedure:  VAS US  ABI  WITH/WO TBI Referring Phys: JOSHUA ROBINS --------------------------------------------------------------------------------  Indications: Left calf claudication High Risk Factors: Hypertension, past history of smoking, prior MI.  Comparison Study: 04/08/2020 ABI/TBI- right=1.05/1.31, left=1.23/1.14 Performing Technologist: Rosaline Fujisawa MHA, RDMS, RVT, RDCS Supporting Technologist: Mardy Dolly  Examination Guidelines: A complete evaluation includes at minimum, Doppler waveform signals and systolic blood pressure reading at the level of bilateral brachial, anterior tibial, and posterior tibial arteries, when vessel segments are accessible. Bilateral testing is considered an integral part of a complete examination. Photoelectric Plethysmograph (PPG) waveforms and toe systolic pressure readings are included as required and additional duplex testing as needed. Limited examinations for reoccurring indications may be performed as noted.  ABI Findings: +---------+------------------+-----+--------+--------+ Right    Rt Pressure (mmHg)IndexWaveformComment  +---------+------------------+-----+--------+--------+ Brachial 116                                     +---------+------------------+-----+--------+--------+ ATA      103               0.89 biphasic         +---------+------------------+-----+--------+--------+ PTA      124               1.07 biphasic         +---------+------------------+-----+--------+--------+ Great Toe88                0.76                  +---------+------------------+-----+--------+--------+ +---------+------------------+-----+---------+-------+ Left     Lt Pressure (mmHg)IndexWaveform Comment +---------+------------------+-----+---------+-------+ Brachial 105                                     +---------+------------------+-----+---------+-------+ ATA      104               0.90 triphasic         +---------+------------------+-----+---------+-------+ PTA      126               1.09 triphasic        +---------+------------------+-----+---------+-------+ Burnetta Boyers               0.97                  +---------+------------------+-----+---------+-------+ +-------+-----------+-----------+------------+------------+ ABI/TBIToday's ABIToday's TBIPrevious ABIPrevious TBI +-------+-----------+-----------+------------+------------+ Right  1.07       0.76       1.05        1.31         +-------+-----------+-----------+------------+------------+ Left   1.09       0.97       1.23        1.14         +-------+-----------+-----------+------------+------------+  Right ABIs appear essentially unchanged compared to prior study on 04/08/2020. Left ABIs appear decreased compared to prior study on 04/08/2020.  Summary: Right: Resting right ankle-brachial index is within normal range. The right toe-brachial index is normal.  Left: Resting left ankle-brachial index is within normal range. The left toe-brachial index is normal.  *See table(s) above for measurements and observations.  Electronically signed by Fonda Rim on 03/07/2024 at 10:16:39 AM.    Final    VAS US  AAA DUPLEX Result Date: 03/07/2024 ABDOMINAL AORTA STUDY Patient Name:  Elisabella  J Decook  Date of Exam:   03/07/2024 Medical  Rec #: [id number]        Accession #:    [id number] Date of Birth: 02/21/1961       Patient Gender: F Patient Age:   65 years Exam Location:  [address] Procedure:      VAS US  AAA DUPLEX Referring Phys: JOSHUA ROBINS --------------------------------------------------------------------------------  Indications: Follow up exam for known AAA. Risk Factors: Hypertension, hyperlipidemia, past history of smoking, prior MI.  Comparison Study: No prior study Performing Technologist: Rosaline Fujisawa MHA, RDMS, RVT, RDCS  Examination Guidelines: A complete evaluation includes B-mode imaging, spectral Doppler,  color Doppler, and power Doppler as needed of all accessible portions of each vessel. Bilateral testing is considered an integral part of a complete examination. Limited examinations for reoccurring indications may be performed as noted.  Abdominal Aorta Findings: +-----------+-------+----------+----------+--------+--------+--------+ Location   AP (cm)Trans (cm)PSV (cm/s)WaveformThrombusComments +-----------+-------+----------+----------+--------+--------+--------+ Proximal   2.23   2.34      37                                 +-----------+-------+----------+----------+--------+--------+--------+ Mid        2.24   2.56      31                                 +-----------+-------+----------+----------+--------+--------+--------+ Distal     2.77   3.37      35                                 +-----------+-------+----------+----------+--------+--------+--------+ RT CIA Prox1.3    1.2       118       biphasic                 +-----------+-------+----------+----------+--------+--------+--------+ LT CIA Prox1.4    1.6       123       biphasic                 +-----------+-------+----------+----------+--------+--------+--------+  Summary: Abdominal Aorta: There is evidence of abnormal dilatation of the distal Abdominal aorta. The largest aortic measurement is 3.4 cm.  *See table(s) above for measurements and observations.  Electronically signed by Fonda Rim on 03/07/2024 at 10:04:05 AM.    Final    CT Chest Wo Contrast Result Date: 03/06/2024 CLINICAL DATA:  Non-small-cell lung cancer restaging, status post left upper lobectomy * Tracking Code: BO * EXAM: CT CHEST WITHOUT CONTRAST TECHNIQUE: Multidetector CT imaging of the chest was performed following the standard protocol without IV contrast. RADIATION DOSE REDUCTION: This exam was performed according to the departmental dose-optimization program which includes automated exposure control, adjustment of the mA and/or  kV according to patient size and/or use of iterative reconstruction technique. COMPARISON:  01/10/2024 FINDINGS: Cardiovascular: Aortic atherosclerosis. Left chest multi lead pacer defibrillator. Normal heart size. Left and right coronary artery calcifications. Unchanged small pericardial effusion. Mediastinum/Nodes: No enlarged mediastinal, hilar, or axillary lymph nodes. Thyroid  gland, trachea, and esophagus demonstrate no significant findings. Lungs/Pleura: Unchanged postoperative appearance of the chest status post left upper lobectomy. Mild centrilobular emphysema. No pleural effusion or pneumothorax. Upper Abdomen: No acute abnormality. Unchanged benign bilateral macroscopic fat containing adrenal adenomata. Musculoskeletal: No chest wall abnormality. No acute osseous findings. Left shoulder reverse arthroplasty. IMPRESSION: 1. Unchanged postoperative appearance of the chest status post left upper lobectomy. No  evidence of recurrent or metastatic disease in the chest. 2. Emphysema. 3. Coronary artery disease. Aortic Atherosclerosis (ICD10-I70.0) and Emphysema (ICD10-J43.9). Electronically Signed   By: Marolyn JONETTA Jaksch M.D.   On: 03/06/2024 07:18    ASSESSMENT AND PLAN: This is a very pleasant 63 years old white female with Stage 1A (T1b, N0, M0) adenocarcinoma of the lung diagnosed in February 2025.  She is status post left upper lobectomy with lymph node sampling under the care of Dr. Shyrl on 08/31/2023 She had repeat CT scan of the chest performed recently.  I personally and independently reviewed the scan and discussed the result with the patient today.  Her scan showed no concerning findings for disease recurrence or metastasis. Assessment and Plan Assessment & Plan Stage I non-small cell lung cancer, post left upper lobectomy, under surveillance Stage I non-small cell lung cancer, status post left upper lobectomy with lymph node dissection. Currently under surveillance with no evidence of  disease recurrence or metastasis on recent chest CT scan. Reports occasional dyspnea, but oxygen  saturation remains at 100%. No chest pain, hemoptysis, or weight loss. Weight gain noted. Small risk of recurrence discussed, emphasizing the importance of regular surveillance. - Continue surveillance with CT scan of the chest every six months until two years post-surgery, then annually. - Will perform lab tests and CT scan prior to next visit. The patient voices understanding of current disease status and treatment options and is in agreement with the current care plan.  All questions were answered. The patient knows to call the clinic with any problems, questions or concerns. We can certainly see the patient much sooner if necessary. The total time spent in the appointment was 20 minutes including review of chart and various tests results, discussions about plan of care and coordination of care plan .   Disclaimer: This note was dictated with voice recognition software. Similar sounding words can inadvertently be transcribed and may not be corrected upon review.

## 2024-03-19 ENCOUNTER — Telehealth: Payer: Self-pay | Admitting: Internal Medicine

## 2024-03-19 NOTE — Telephone Encounter (Signed)
 Scheduled patient for next appointment. Called and spoke with the patient, she is aware.

## 2024-03-20 NOTE — Progress Notes (Signed)
 I connected with  Erin [name]  J Knezevic on 03/20/24 by a audio enabled telemedicine application and verified that I am speaking with the correct person using two identifiers.  Patient Location: Home  Provider Location: Office/Clinic  I discussed the limitations of evaluation and management by telemedicine. The patient expressed understanding and agreed to proceed.  Subjective:   Erin [name]  THALYA FOUCHE is a 63 y.o. female who presents for a Medicare Annual Wellness Visit.  Allergies (verified) Alpha-gal, Other, Bovine (beef) protein-containing drug products, Chantix  [varenicline ], Porcine (pork) protein-containing drug products, Reglan  [metoclopramide ], and Tape   History: Past Medical History:  Diagnosis Date   AICD (automatic cardioverter/defibrillator) present    Allergic reaction to alpha-gal    Allergy 03/05/2022   Anemia    Anxiety state 03/25/2016   Arthritis    Basal cell carcinoma of forehead    Brain aneurysm    CHF (congestive heart failure) (HCC) 03/11/2016   Coronary artery disease    a. 03/11/16 PCI with DES-->Prox/Mid Cx;  b. 03/14/16 PCI with DES x2-->RCA, EF 30-35%.   Depression 06/09/2010   Encounter for general adult medical examination with abnormal findings 05/04/2022   Essential hypertension    Hx   GERD (gastroesophageal reflux disease)    HFrEF (heart failure with reduced ejection fraction) (HCC)    a. 10/2016 Echo: EF 35-40%, Gr1 DD, mild focal basal septal hypertrophy, basal inflat, mid inflat, basal antlat AK. Mid infept/inf/antlat, apical lateral sev HK. Mod MR. mildly reduced RV fxn. Mild TR.   History of pneumonia    Hyperlipidemia    IBS (irritable bowel syndrome)    Ischemic cardiomyopathy    a. 10/2016 Echo: EF 35-40%, Gr1 DD.   Lung cancer (HCC) 2024   Mitral regurgitation    Neuromuscular disorder (HCC)    NSTEMI (non-ST elevated myocardial infarction) (HCC) 03/10/2016   Peripheral vascular disease    Pneumonia 03/2016   Shingles    Squamous cell  cancer of skin of nose    Thrombocytosis 03/26/2016   Tobacco abuse    Trichimoniasis    Wears dentures    Wears glasses    Past Surgical History:  Procedure Laterality Date   APPENDECTOMY  1982   BICEPT TENODESIS Left 03/21/2023   Procedure: BICEPS TENODESIS;  Surgeon: Addie Cordella Hamilton, MD;  Location: [facility] OR;  Service: Orthopedics;  Laterality: Left;   BIOPSY  09/20/2018   Procedure: BIOPSY;  Surgeon: Shaaron Lamar HERO, MD;  Location: [facility] ENDO SUITE;  Service: Endoscopy;;  colon   BIOPSY  01/05/2021   Procedure: BIOPSY;  Surgeon: Teressa Toribio SQUIBB, MD;  Location: [facility] ENDOSCOPY;  Service: Endoscopy;;   BIOPSY  04/27/2022   Procedure: BIOPSY;  Surgeon: Cindie Carlin POUR, DO;  [facility]: [facility];  Service: Endoscopy;;   BRONCHIAL BIOPSY  07/03/2023   Procedure: BRONCHIAL BIOPSIES;  Surgeon: Shelah Lamar RAMAN, MD;  Location: [facility] ENDOSCOPY;  Service: Pulmonary;;   BRONCHIAL BRUSHINGS  07/03/2023   Procedure: BRONCHIAL BRUSHINGS;  Surgeon: Shelah Lamar RAMAN, MD;  Location: [facility] ENDOSCOPY;  Service: Pulmonary;;   BRONCHIAL NEEDLE ASPIRATION BIOPSY  07/03/2023   Procedure: BRONCHIAL NEEDLE ASPIRATION BIOPSIES;  Surgeon: Shelah Lamar RAMAN, MD;  Location: [facility] ENDOSCOPY;  Service: Pulmonary;;   BRONCHIAL WASHINGS  07/03/2023   Procedure: BRONCHIAL WASHINGS;  Surgeon: Shelah Lamar RAMAN, MD;  Location: [facility] ENDOSCOPY;  Service: Pulmonary;;   CARDIAC CATHETERIZATION N/A 03/11/2016   Procedure: Left Heart Cath and Coronary Angiography;  Surgeon: Alm LELON Clay, MD;  Location: [facility] INVASIVE CV  LAB;  Service: Cardiovascular;  Laterality: N/A;   CARDIAC CATHETERIZATION N/A 03/11/2016   Procedure: Coronary Stent Intervention;  Surgeon: Alm LELON Clay, MD;  Location: [facility] INVASIVE CV LAB;  Service: Cardiovascular;  Laterality: N/A;   CARDIAC CATHETERIZATION N/A 03/14/2016   Procedure: Coronary Stent Intervention;  Surgeon: Peter M Jordan, MD;  Location: [facility] INVASIVE CV LAB;  Service: Cardiovascular;  Laterality: N/A;    CEREBRAL ANEURYSM REPAIR  04/2019   stent placed   CHOLECYSTECTOMY OPEN  1984   COLONOSCOPY N/A 08/28/2023   Procedure: COLONOSCOPY;  Surgeon: Cindie Carlin POUR, DO;  [facility];  Service: Endoscopy;  Laterality: N/A;  1030am, asa 3, has defibrillator   COLONOSCOPY WITH PROPOFOL  N/A 09/20/2018   Procedure: COLONOSCOPY WITH PROPOFOL ;  Surgeon: Shaaron Lamar HERO, MD;  [facility];  Service: Endoscopy;  Laterality: N/A;  10:30am   CORONARY ANGIOPLASTY WITH STENT PLACEMENT  03/14/2016   ESOPHAGOGASTRODUODENOSCOPY (EGD) WITH PROPOFOL  N/A 09/20/2018   Procedure: ESOPHAGOGASTRODUODENOSCOPY (EGD) WITH PROPOFOL ;  Surgeon: Shaaron Lamar HERO, MD;  [facility];  Service: Endoscopy;  Laterality: N/A;   ESOPHAGOGASTRODUODENOSCOPY (EGD) WITH PROPOFOL  N/A 01/05/2021   Procedure: ESOPHAGOGASTRODUODENOSCOPY (EGD) WITH PROPOFOL ;  Surgeon: Teressa Toribio SQUIBB, MD;  Location: [facility] ENDOSCOPY;  Service: Endoscopy;  Laterality: N/A;   ESOPHAGOGASTRODUODENOSCOPY (EGD) WITH PROPOFOL  N/A 04/27/2022   Procedure: ESOPHAGOGASTRODUODENOSCOPY (EGD) WITH PROPOFOL ;  Surgeon: Cindie Carlin POUR, DO;  [facility];  Service: Endoscopy;  Laterality: N/A;  9:15am, asa 3/4, ASAP   ESOPHAGOGASTRODUODENOSCOPY (EGD) WITH PROPOFOL  N/A 01/04/2023   Procedure: ESOPHAGOGASTRODUODENOSCOPY (EGD) WITH PROPOFOL ;  Surgeon: Shaaron Lamar HERO, MD;  [facility];  Service: Endoscopy;  Laterality: N/A;  130pm, asa 3   FIDUCIAL MARKER PLACEMENT  07/03/2023   Procedure: FIDUCIAL MARKER PLACEMENT;  Surgeon: Shelah Lamar RAMAN, MD;  Location: [facility] ENDOSCOPY;  Service: Pulmonary;;   FINGER ARTHROPLASTY Left 05/14/2013   Procedure: LEFT THUMB CARPAL METACARPAL ARTHROPLASTY;  Surgeon: Franky JONELLE Curia, MD;  Location: [facility];  Service: Orthopedics;  Laterality: Left;   ICD IMPLANT N/A 04/03/2020   Procedure: ICD IMPLANT;  Surgeon: Inocencio Soyla Lunger, MD;  Location: [facility] INVASIVE CV LAB;  Service:  Cardiovascular;  Laterality: N/A;   INTERCOSTAL NERVE BLOCK Left 08/31/2023   Procedure: BLOCK, NERVE, INTERCOSTAL;  Surgeon: Shyrl Linnie KIDD, MD;  Location: [facility] OR;  Service: Thoracic;  Laterality: Left;   IR ANGIO INTRA EXTRACRAN SEL COM CAROTID INNOMINATE BILAT MOD SED  01/05/2017   IR ANGIO INTRA EXTRACRAN SEL COM CAROTID INNOMINATE BILAT MOD SED  03/19/2019   IR ANGIO INTRA EXTRACRAN SEL COM CAROTID INNOMINATE BILAT MOD SED  06/04/2020   IR ANGIO INTRA EXTRACRAN SEL INTERNAL CAROTID UNI L MOD SED  04/15/2019   IR ANGIO VERTEBRAL SEL VERTEBRAL BILAT MOD SED  01/05/2017   IR ANGIO VERTEBRAL SEL VERTEBRAL BILAT MOD SED  03/19/2019   IR ANGIO VERTEBRAL SEL VERTEBRAL UNI L MOD SED  06/04/2020   IR ANGIOGRAM FOLLOW UP STUDY  04/15/2019   IR RADIOLOGIST EVAL & MGMT  12/30/2016   IR TRANSCATH/EMBOLIZ  04/15/2019   IR US  GUIDE VASC ACCESS RIGHT  03/19/2019   IR US  GUIDE VASC ACCESS RIGHT  06/04/2020   JOINT REPLACEMENT  03/21/2023   LOBECTOMY, LUNG, ROBOT-ASSISTED, USING VATS Left 08/31/2023   Procedure: LOBECTOMY, LEFT UPPER LUNG, ROBOT-ASSISTED, USING VATS;  Surgeon: Shyrl Linnie KIDD, MD;  Location: [facility] OR;  Service: Thoracic;  Laterality: Left;  Left Robotic Assisted Thoracoscopy for Left  Upper Lobectomy   LYMPH NODE BIOPSY Left 08/31/2023   Procedure: LYMPH NODE BIOPSY;  Surgeon: Shyrl Linnie KIDD, MD;  Location: [facility] OR;  Service: Thoracic;  Laterality: Left;   MALONEY DILATION N/A 09/20/2018   Procedure: AGAPITO DILATION;  Surgeon: Shaaron Lamar HERO, MD;  [facility];  Service: Endoscopy;  Laterality: N/A;   MALONEY DILATION N/A 01/04/2023   Procedure: AGAPITO DILATION;  Surgeon: Shaaron Lamar HERO, MD;  [facility];  Service: Endoscopy;  Laterality: N/A;   RADIOLOGY WITH ANESTHESIA N/A 04/15/2019   Procedure: BARBARANN;  Surgeon: Dolphus Carrion, MD;  Location: [facility] OR;  Service: Radiology;  Laterality: N/A;   REVERSE SHOULDER ARTHROPLASTY Left 03/21/2023    Procedure: LEFT REVERSE SHOULDER ARTHROPLASTY;  Surgeon: Addie Cordella Hamilton, MD;  Location: [facility] OR;  Service: Orthopedics;  Laterality: Left;   RIGHT/LEFT HEART CATH AND CORONARY ANGIOGRAPHY N/A 08/19/2019   Procedure: RIGHT/LEFT HEART CATH AND CORONARY ANGIOGRAPHY;  Surgeon: Rolan Ezra RAMAN, MD;  Location: [facility] INVASIVE CV LAB;  Service: Cardiovascular;  Laterality: N/A;   TUBAL LIGATION  1987   VAGINAL HYSTERECTOMY  2009   Family History  Problem Relation Age of Onset   Stroke Mother    Hypertension Mother    Diabetes Mother    Heart attack Mother    Heart attack Father    Diabetes Father    Hypertension Father    CAD Father    Colon polyps Father 50       pre-cancerous    Stroke Father    Dementia Father    Hyperlipidemia Father    Arthritis Father    COPD Father        smoked and worked [employer]   Heart disease Father    Breast cancer Maternal Grandmother    Diabetes Maternal Grandmother    Cancer Maternal Grandfather        Tongue and esophageal   Cancer Paternal Grandmother    Anxiety disorder Daughter    Depression Daughter    Anxiety disorder Daughter    Heart failure Other    Colon cancer Neg Hx    Social History   Occupational History   Occupation: CNA  Tobacco Use   Smoking status: Former    Current packs/day: 1.50    Average packs/day: 1.5 packs/day for 40.0 years (60.0 ttl pk-yrs)    Types: Cigarettes   Smokeless tobacco: Never   Tobacco comments:    Smokes off and on 06/14/23 Sonny Lecher, CMA     No cigarettes since Thursday this week>> 07/06/2023  Vaping Use   Vaping status: Never Used  Substance and Sexual Activity   Alcohol use: Not Currently    Comment: occasionally   Drug use: Not Currently    Types: Marijuana   Sexual activity: Not Currently    Birth control/protection: Surgical    Comment: hyst   Tobacco Counseling Counseling given: Yes Tobacco comments: Smokes off and on 06/14/23 Sonny Lecher, CMA  No cigarettes since  Thursday this week>> 07/06/2023  SDOH Screenings   Food Insecurity: Patient Declined (03/11/2024)  Housing: Low Risk  (03/11/2024)  Transportation Needs: No Transportation Needs (03/11/2024)  Utilities: Not At Risk (03/14/2024)  Alcohol Screen: Low Risk  (06/11/2021)  Depression (PHQ2-9): Low Risk  (03/14/2024)  Financial Resource Strain: Low Risk  (03/11/2024)  Physical Activity: Insufficiently Active (03/11/2024)  Social Connections: Socially Isolated (03/11/2024)  Stress: Stress Concern Present (03/11/2024)  Tobacco Use: Medium Risk (03/14/2024)  Health Literacy: Adequate Health Literacy (  03/14/2024)   Depression Screen    03/14/2024    3:08 PM 07/26/2023   12:45 PM 06/01/2023    3:53 PM 04/20/2023    8:59 AM 04/26/2022    1:19 PM 06/11/2021    8:32 AM 11/27/2017    3:32 PM  PHQ 2/9 Scores  PHQ - 2 Score 0 3 0 0 2 2 1   PHQ- 9 Score  7  0  0  10  11       Data saved with a previous flowsheet row definition     Goals Addressed               This Visit's Progress     Remain active and healthy (pt-stated)         Visit info / Clinical Intake: Medicare Wellness Visit Type:: Initial Annual Wellness Visit Medicare Wellness Visit Mode:: Telephone If telephone:: video declined Because this visit was a virtual/telehealth visit:: pt reported vitals Interpreter Needed?: No Pre-visit prep was completed: yes AWV questionnaire completed by patient prior to visit?: yes Date:: 03/11/24 Living arrangements:: lives with spouse/significant other; with family/others Typical amount of pain: none Does pain affect daily life?: no Are you currently prescribed opioids?: no  Dietary Habits and Nutritional Risks How many meals a day?: 3 Eats fruit and vegetables daily?: yes Most meals are obtained by: preparing own meals Diabetic:: no  Functional Status Activities of Daily Living (to include ambulation/medication): (Patient-Rptd) Independent Ambulation: (Patient-Rptd) Independent Medication  Administration: Independent Home Management: (Patient-Rptd) Independent Manage your own finances?: yes Primary transportation is: driving Concerns about vision?: no *vision screening is required for WTM* Concerns about hearing?: no  Fall Screening Falls in the past year?: (Patient-Rptd) 0 Number of falls in past year: (Patient-Rptd) 0 Was there an injury with Fall?: (Patient-Rptd) 0 Fall Risk Category Calculator: (Patient-Rptd) 0 Patient Fall Risk Level: (Patient-Rptd) Low Fall Risk  Fall Risk Patient at Risk for Falls Due to: No Fall Risks Fall risk Follow up: Falls evaluation completed; Education provided; Falls prevention discussed  Home and Transportation Safety: All rugs have non-skid backing?: N/A, no rugs All stairs or steps have railings?: yes Grab bars in the bathtub or shower?: yes Have non-skid surface in bathtub or shower?: yes Good home lighting?: yes Regular seat belt use?: yes Hospital stays in the last year:: (!) yes How many hospital stays:: 5 Reason: pulmonary  Cognitive Assessment Difficulty concentrating, remembering, or making decisions? : no Will 6CIT or Mini Cog be Completed: no 6CIT or Mini Cog Declined: patient alert, oriented, able to answer questions appropriately and recall recent events  Advance Directives (For Healthcare) Does Patient Have a Medical Advance Directive?: No Would patient like information on creating a medical advance directive?: No - Patient declined  Reviewed/Updated  Reviewed/Updated: All        Objective:    Today's Vitals   03/14/24 1453  BP: 107/68  Weight: 118 lb (53.5 kg)  Height: 5' (1.524 m)   Body mass index is 23.05 kg/m.  Current Medications (verified) Outpatient Encounter Medications as of 03/14/2024  Medication Sig   aspirin  EC 81 MG tablet Take 1 tablet (81 mg total) by mouth every evening. Okay to restart on 2/29/2025   atorvastatin  (LIPITOR ) 40 MG tablet Take 1 tablet (40 mg total) by mouth daily.    clopidogrel  (PLAVIX ) 75 MG tablet Take 1 tablet by mouth once daily   EPINEPHrine  (EPIPEN  2-PAK) 0.3 mg/0.3 mL IJ SOAJ injection Inject 0.3 mg into the muscle as needed  for anaphylaxis.   ezetimibe  (ZETIA ) 10 MG tablet Take 1 tablet (10 mg total) by mouth daily.   FARXIGA  10 MG TABS tablet Take 1 tablet (10 mg total) by mouth daily before breakfast.   fenofibrate  (TRICOR ) 145 MG tablet Take 1 tablet by mouth once daily   furosemide  (LASIX ) 20 MG tablet Take 2 tablets (40 mg total) by mouth daily.   gabapentin  (NEURONTIN ) 300 MG capsule Take 1 capsule by mouth at bedtime   linaclotide  (LINZESS ) 145 MCG CAPS capsule Take 1 capsule (145 mcg total) by mouth daily before breakfast.   metoprolol  succinate (TOPROL  XL) 25 MG 24 hr tablet Take 0.5 tablets (12.5 mg total) by mouth at bedtime.   Multiple Vitamins-Minerals (MULTIVITAMIN WITH MINERALS) tablet Take 1 tablet by mouth daily.   nicotine  (NICODERM CQ  - DOSED IN MG/24 HOURS) 21 mg/24hr patch Place 21 mg onto the skin daily.   nitroGLYCERIN  (NITROSTAT ) 0.4 MG SL tablet Place 1 tablet (0.4 mg total) under the tongue every 5 (five) minutes x 3 doses as needed for chest pain (if no relief after 2nd dose, proceed to the ED for an evaluation or call 911).   ondansetron  (ZOFRAN ) 8 MG tablet Take 1 tablet (8 mg total) by mouth every 8 (eight) hours as needed for nausea or vomiting.   pantoprazole  (PROTONIX ) 40 MG tablet TAKE 1 TABLET BY MOUTH TWICE DAILY BEFORE A MEAL   potassium chloride  SA (KLOR-CON  M) 20 MEQ tablet Take 1 tablet (20 mEq total) by mouth daily.   promethazine  (PHENERGAN ) 12.5 MG tablet TAKE 1 TABLET BY MOUTH EVERY 6 HOURS AS NEEDED FOR NAUSEA OR VOMITING   REPATHA  SURECLICK 140 MG/ML SOAJ INJECT 140 MG INTO THE SKIN EVERY 14 DAYS   rOPINIRole  (REQUIP ) 0.5 MG tablet Take 0.5 mg by mouth at bedtime.    sertraline  (ZOLOFT ) 25 MG tablet Take 1 tablet (25 mg total) by mouth daily.   traZODone  (DESYREL ) 50 MG tablet TAKE 1 TABLET BY MOUTH AT  BEDTIME   No facility-administered encounter medications on file as of 03/14/2024.   Hearing/Vision screen Hearing Screening - Comments:: Patient denies any hearing difficulties.   Vision Screening - Comments:: Wears rx glasses - up to date with routine eye exams with  [facility] in [place] Immunizations and Health Maintenance Health Maintenance  Topic Date Due   COVID-19 Vaccine (1) Never done   Hepatitis C Screening  Never done   DTaP/Tdap/Td (1 - Tdap) Never done   Pneumococcal Vaccine: 50+ Years (2 of 2 - PCV) 08/05/2017   Zoster Vaccines- Shingrix (2 of 2) 11/07/2022   Mammogram  12/22/2022   Medicare Annual Wellness (AWV)  03/14/2025   Colonoscopy  08/27/2033   Influenza Vaccine  Completed   HIV Screening  Completed   Hepatitis B Vaccines 19-59 Average Risk  Aged Out   HPV VACCINES  Aged Out   Meningococcal B Vaccine  Aged Out   Lung Cancer Screening  Discontinued        Assessment/Plan:  This is a routine wellness examination for Kitti .  Patient Care Team: Shaaron Lamar HERO, MD as Consulting Physician (Gastroenterology) Prentis Duwaine BROCKS, RN as Oncology Nurse Navigator Inocencio Soyla Lunger, MD as Consulting Physician (Cardiology) Rolan Ezra RAMAN, MD as Consulting Physician (Cardiology)  I have personally reviewed and noted the following in the patient's chart:   Medical and social history Use of alcohol, tobacco or illicit drugs  Current medications and supplements including opioid prescriptions. Functional ability and status Nutritional  status Physical activity Advanced directives List of other physicians Hospitalizations, surgeries, and ER visits in previous 12 months Vitals Screenings to include cognitive, depression, and falls Referrals and appointments  Orders Placed This Encounter  Procedures   MM 3D SCREENING MAMMOGRAM BILATERAL BREAST    Standing Status:   Future    Expiration Date:   03/14/2025    Reason for Exam (SYMPTOM  OR DIAGNOSIS  REQUIRED):   breast cancer screening    Preferred imaging location?:   [facility]   In addition, I have reviewed and discussed with patient certain preventive protocols, quality metrics, and best practice recommendations. A written personalized care plan for preventive services as well as general preventive health recommendations were provided to patient.   Clearence Vitug, CMA   03/20/2024   No follow-ups on file.  After Visit Summary: (MyChart) Due to this being a telephonic visit, the after visit summary with patients personalized plan was offered to patient via MyChart   Nurse Notes: transfer of care appointment scheduled

## 2024-03-27 ENCOUNTER — Other Ambulatory Visit: Payer: Self-pay | Admitting: Gastroenterology

## 2024-03-27 ENCOUNTER — Other Ambulatory Visit: Payer: Self-pay | Admitting: Internal Medicine

## 2024-03-27 DIAGNOSIS — F410 Panic disorder [episodic paroxysmal anxiety] without agoraphobia: Secondary | ICD-10-CM

## 2024-03-29 ENCOUNTER — Encounter (HOSPITAL_COMMUNITY): Payer: Self-pay

## 2024-03-29 ENCOUNTER — Ambulatory Visit (HOSPITAL_COMMUNITY)
Admission: RE | Admit: 2024-03-29 | Discharge: 2024-03-29 | Disposition: A | Discharge status: Home / Self Care | Source: Ambulatory Visit

## 2024-03-29 DIAGNOSIS — Z1231 Encounter for screening mammogram for malignant neoplasm of breast: Secondary | ICD-10-CM | POA: Insufficient documentation

## 2024-04-01 ENCOUNTER — Telehealth: Payer: Self-pay

## 2024-04-01 ENCOUNTER — Ambulatory Visit: Attending: Cardiology

## 2024-04-01 DIAGNOSIS — Z95810 Presence of automatic (implantable) cardiac defibrillator: Secondary | ICD-10-CM

## 2024-04-01 DIAGNOSIS — I5022 Chronic systolic (congestive) heart failure: Secondary | ICD-10-CM | POA: Diagnosis not present

## 2024-04-01 NOTE — Telephone Encounter (Signed)
 Remote ICM transmission received.  Attempted call to patient regarding ICM remote transmission and no answer.

## 2024-04-01 NOTE — Progress Notes (Signed)
 EPIC Encounter for ICM Monitoring  Patient Name: Erin  Erin [name] is a 63 y.o. female Date: 04/01/2024 [name] Care Physican: Erin [name] Cardiologist: Erin [name] Electrophysiologist: Erin [name] 08/11/2021 Office Weight: 121 lbs. 03/25/2022 Office Weight 125-127 lbs 03/13/2023 Weight: 110 lbs 03/24/2023 Weight: 115 lbs 05/26/2023 Weight: 105 lbs 07/10/2023 Weight: 103 lbs 09/15/2023 Office Weight: 110 lbs 10/04/2023 Weight: 107 lbs 12/06/2023 Home Weight: 107-110 lbs 01/16/2024 Home Weight: 110 lbs 03/01/2024 Weight: 118 lbs                                                          Attempted call to patient and unable to reach.   Transmission results reviewed.    Diet:  No updates    Since 02/26/2024 ICM Remote Transmission:  Optivol thoracic impedance suggesting possible fluid accumulation since 03/27/2024.   Prescribed: Furosemide  20 mg Take 2 tablets (40 mg total) by mouth daily.   Potassium 20 mEq take 1 tablet (20 mEq total) by mouth daily   Labs: 03/01/2024 Creatinine 1.77, BUN 21, Potassium 3.9, Sodium 140, GFR 32 01/24/2024 Creatinine 1.59, BUN 19, Potassium 3.7, Sodium 139 01/11/2024 Creatinine 1.45, BUN 22, Potassium 4.0, Sodium 138, GFR 41  01/10/2024 Creatinine 1.71, BUN 23, Potassium 4.7, Sodium 137, GFR 34  12/12/2023 Creatinine 1.89, BUN 17, Potassium 4.3, Sodium 140, GFR 30 11/23/2023 Creatinine 2.10, BUN 24, Potassium 4.4, Sodium 139, GFR 26  11/14/2023 Creatinine 1.90, BUN 16, Potassium 3.5, Sodium 138, GFR 30 (Lasix  decreased to 20 mg day in response to results) 11/02/2023 Creatinine 1.54, BUN 18, Potassium 3.8, Sodium 141  A complete set of results can be found in Results Review.   Recommendations:   Unable to reach.    Will send to HF clinic for review if patient is reached.    Follow-up plan: ICM clinic phone appointment on 05/06/2024.   91 day device clinic remote transmission 05/15/2024.     EP/Cardiology Office Visits:   05/08/2024 with Dr Erin [name].  Recall 02/18/2024  with Erin Passey, PA.    Copy of ICM check sent to Dr. Inocencio.     Remote monitoring is medically necessary for Heart Failure Management.    Daily Thoracic Impedance ICM trend: 01/01/2024 through 04/01/2024.    12-14 Month Thoracic Impedance ICM trend:     Erin GORMAN Garner, RN 04/01/2024 4:00 PM

## 2024-04-02 ENCOUNTER — Other Ambulatory Visit: Payer: Self-pay | Admitting: Gastroenterology

## 2024-04-02 DIAGNOSIS — N1832 Chronic kidney disease, stage 3b: Secondary | ICD-10-CM | POA: Diagnosis not present

## 2024-04-02 DIAGNOSIS — I130 Hypertensive heart and chronic kidney disease with heart failure and stage 1 through stage 4 chronic kidney disease, or unspecified chronic kidney disease: Secondary | ICD-10-CM | POA: Diagnosis not present

## 2024-04-02 DIAGNOSIS — I509 Heart failure, unspecified: Secondary | ICD-10-CM | POA: Diagnosis not present

## 2024-04-02 DIAGNOSIS — I700 Atherosclerosis of aorta: Secondary | ICD-10-CM | POA: Diagnosis not present

## 2024-04-02 DIAGNOSIS — I719 Aortic aneurysm of unspecified site, without rupture: Secondary | ICD-10-CM | POA: Diagnosis not present

## 2024-04-02 DIAGNOSIS — I739 Peripheral vascular disease, unspecified: Secondary | ICD-10-CM | POA: Diagnosis not present

## 2024-04-02 DIAGNOSIS — J439 Emphysema, unspecified: Secondary | ICD-10-CM | POA: Diagnosis not present

## 2024-04-02 DIAGNOSIS — I25119 Atherosclerotic heart disease of native coronary artery with unspecified angina pectoris: Secondary | ICD-10-CM | POA: Diagnosis not present

## 2024-04-02 DIAGNOSIS — I4891 Unspecified atrial fibrillation: Secondary | ICD-10-CM | POA: Diagnosis not present

## 2024-04-02 NOTE — Telephone Encounter (Signed)
 Erin [name], her request for phenergan  has increased. Last seen in June. Please reach out and see if she is having more issues. And is she on the buspirone  that I prescribed her in June? If not, why?

## 2024-04-03 ENCOUNTER — Other Ambulatory Visit: Payer: Self-pay | Admitting: Gastroenterology

## 2024-04-03 MED ORDER — BUSPIRONE HCL 10 MG PO TABS: 10.0000 mg | ORAL_TABLET | Freq: Three times a day (TID) | ORAL | 3 refills | Status: AC

## 2024-04-03 NOTE — Telephone Encounter (Signed)
 I suggest we go back up to 10mg  before meals if she is willing.  Let me know so I can send in new rx

## 2024-04-03 NOTE — Telephone Encounter (Signed)
 Let's verify what dose she is taking. Her discharge papers from 01/2024 said the hospital doctor decreased her to buspar  5mg  before meals (from 10mg  before meals). I reviewed the notes and don't see any reason why the dose would have been adjusted. This might be why she is having more problems.   Ask her if she is on 5mg  or 10mg   Also we need to go ahead and arrange for ov with Dr. Shaaron.

## 2024-04-03 NOTE — Addendum Note (Signed)
 Addended by: EZZARD SONNY RAMAN on: 04/03/2024 03:54 PM   Modules accepted: Orders

## 2024-04-03 NOTE — Telephone Encounter (Signed)
 Pt states that she is currently taking 5 mg. Pt was scheduled with Dr. Shaaron on 05/07/24.

## 2024-04-03 NOTE — Telephone Encounter (Signed)
 Pt is willing to go back to 10 mg before meals and would like Rx to be sent in.

## 2024-04-03 NOTE — Telephone Encounter (Signed)
 Pt states that it is still the same with nausea. She stated that she is taking the buspirone  and it was working up until 2 weeks ago and the loss of appetite and weight loss has started back.

## 2024-04-03 NOTE — Telephone Encounter (Signed)
 Rx sent.

## 2024-04-03 NOTE — Telephone Encounter (Signed)
 Lm for return call

## 2024-04-08 ENCOUNTER — Other Ambulatory Visit: Payer: Self-pay | Admitting: Family Medicine

## 2024-04-08 DIAGNOSIS — G6289 Other specified polyneuropathies: Secondary | ICD-10-CM

## 2024-04-30 ENCOUNTER — Other Ambulatory Visit: Payer: Self-pay | Admitting: Family Medicine

## 2024-04-30 ENCOUNTER — Other Ambulatory Visit: Payer: Self-pay | Admitting: Gastroenterology

## 2024-04-30 DIAGNOSIS — F411 Generalized anxiety disorder: Secondary | ICD-10-CM

## 2024-04-30 DIAGNOSIS — F5104 Psychophysiologic insomnia: Secondary | ICD-10-CM

## 2024-05-06 ENCOUNTER — Ambulatory Visit: Attending: Cardiology

## 2024-05-06 DIAGNOSIS — I5022 Chronic systolic (congestive) heart failure: Secondary | ICD-10-CM | POA: Diagnosis not present

## 2024-05-06 DIAGNOSIS — Z95810 Presence of automatic (implantable) cardiac defibrillator: Secondary | ICD-10-CM

## 2024-05-07 ENCOUNTER — Telehealth (HOSPITAL_COMMUNITY): Payer: Self-pay | Admitting: Cardiology

## 2024-05-07 ENCOUNTER — Ambulatory Visit: Admitting: Internal Medicine

## 2024-05-07 NOTE — Progress Notes (Signed)
 EPIC Encounter for ICM Monitoring  Patient Name: Erin [name] is a 63 y.o. female Date: 05/07/2024 Primary Care Physican: Pcp, No Primary Cardiologist: Rolan Electrophysiologist: Inocencio 08/11/2021 Office Weight: 121 lbs. 03/25/2022 Office Weight 125-127 lbs 03/13/2023 Weight: 110 lbs 03/24/2023 Weight: 115 lbs 05/26/2023 Weight: 105 lbs 07/10/2023 Weight: 103 lbs 09/15/2023 Office Weight: 110 lbs 10/04/2023 Weight: 107 lbs 12/06/2023 Home Weight: 107-110 lbs 01/16/2024 Home Weight: 110 lbs 03/01/2024 Weight: 118 lbs                                                          Transmission results reviewed.    Diet:  No updates    Since 04/01/2024 ICM Remote Transmission:  Optivol thoracic impedance suggesting possible fluid accumulation  from 04/08/2024-04/29/2024.   Prescribed: Furosemide  20 mg Take 2 tablets (40 mg total) by mouth daily.   Potassium 20 mEq take 1 tablet (20 mEq total) by mouth daily   Labs: 03/01/2024 Creatinine 1.77, BUN 21, Potassium 3.9, Sodium 140, GFR 32 01/24/2024 Creatinine 1.59, BUN 19, Potassium 3.7, Sodium 139 01/11/2024 Creatinine 1.45, BUN 22, Potassium 4.0, Sodium 138, GFR 41  01/10/2024 Creatinine 1.71, BUN 23, Potassium 4.7, Sodium 137, GFR 34  12/12/2023 Creatinine 1.89, BUN 17, Potassium 4.3, Sodium 140, GFR 30 11/23/2023 Creatinine 2.10, BUN 24, Potassium 4.4, Sodium 139, GFR 26  11/14/2023 Creatinine 1.90, BUN 16, Potassium 3.5, Sodium 138, GFR 30 (Lasix  decreased to 20 mg day in response to results) 11/02/2023 Creatinine 1.54, BUN 18, Potassium 3.8, Sodium 141  A complete set of results can be found in Results Review.   Recommendations:  No changes.    Follow-up plan: ICM clinic phone appointment on 06/10/2024.   91 day device clinic remote transmission 05/15/2024.     EP/Cardiology Office Visits:   05/08/2024 with Dr Rolan.  Recall 02/18/2024 with [name], PA.    Copy of ICM check sent to Dr. Inocencio.     Remote monitoring is medically  necessary for Heart Failure Management.    Daily Thoracic Impedance ICM trend: 02/05/2024 through 05/06/2024.    12-14 Month Thoracic Impedance ICM trend:     [name], RN 05/07/2024 5:05 PM

## 2024-05-07 NOTE — Telephone Encounter (Signed)
 Called to confirm/remind patient of their appointment at the [facility] on 05/07/24.   Appointment:   [x] Confirmed  [] Left mess   [] No answer/No voice mail  [] VM Full/unable to leave message  [] Phone not in service  Patient reminded to bring all medications and/or complete list.  Confirmed patient has transportation. Gave directions, instructed to utilize valet parking.

## 2024-05-08 ENCOUNTER — Ambulatory Visit (HOSPITAL_COMMUNITY)
Admission: RE | Admit: 2024-05-08 | Discharge: 2024-05-08 | Disposition: A | Discharge status: Home / Self Care | Source: Ambulatory Visit | Attending: Cardiology | Admitting: Cardiology

## 2024-05-08 ENCOUNTER — Encounter (HOSPITAL_COMMUNITY): Payer: Self-pay | Admitting: Cardiology

## 2024-05-08 ENCOUNTER — Encounter: Payer: Self-pay | Admitting: Internal Medicine

## 2024-05-08 ENCOUNTER — Ambulatory Visit (HOSPITAL_COMMUNITY): Payer: Self-pay | Admitting: Cardiology

## 2024-05-08 VITALS — BP 92/60 | HR 82 | Wt 123.4 lb

## 2024-05-08 DIAGNOSIS — Z79899 Other long term (current) drug therapy: Secondary | ICD-10-CM | POA: Diagnosis not present

## 2024-05-08 DIAGNOSIS — N183 Chronic kidney disease, stage 3 unspecified: Secondary | ICD-10-CM | POA: Insufficient documentation

## 2024-05-08 DIAGNOSIS — I739 Peripheral vascular disease, unspecified: Secondary | ICD-10-CM | POA: Insufficient documentation

## 2024-05-08 DIAGNOSIS — I5042 Chronic combined systolic (congestive) and diastolic (congestive) heart failure: Secondary | ICD-10-CM | POA: Diagnosis present

## 2024-05-08 DIAGNOSIS — C3412 Malignant neoplasm of upper lobe, left bronchus or lung: Secondary | ICD-10-CM | POA: Insufficient documentation

## 2024-05-08 DIAGNOSIS — Z87891 Personal history of nicotine dependence: Secondary | ICD-10-CM | POA: Insufficient documentation

## 2024-05-08 DIAGNOSIS — I6522 Occlusion and stenosis of left carotid artery: Secondary | ICD-10-CM | POA: Insufficient documentation

## 2024-05-08 DIAGNOSIS — I5022 Chronic systolic (congestive) heart failure: Secondary | ICD-10-CM | POA: Insufficient documentation

## 2024-05-08 DIAGNOSIS — I255 Ischemic cardiomyopathy: Secondary | ICD-10-CM | POA: Insufficient documentation

## 2024-05-08 DIAGNOSIS — I252 Old myocardial infarction: Secondary | ICD-10-CM | POA: Insufficient documentation

## 2024-05-08 DIAGNOSIS — I2510 Atherosclerotic heart disease of native coronary artery without angina pectoris: Secondary | ICD-10-CM | POA: Insufficient documentation

## 2024-05-08 DIAGNOSIS — I25119 Atherosclerotic heart disease of native coronary artery with unspecified angina pectoris: Secondary | ICD-10-CM | POA: Diagnosis not present

## 2024-05-08 DIAGNOSIS — Z955 Presence of coronary angioplasty implant and graft: Secondary | ICD-10-CM | POA: Insufficient documentation

## 2024-05-08 DIAGNOSIS — Z96612 Presence of left artificial shoulder joint: Secondary | ICD-10-CM | POA: Insufficient documentation

## 2024-05-08 DIAGNOSIS — I671 Cerebral aneurysm, nonruptured: Secondary | ICD-10-CM | POA: Diagnosis present

## 2024-05-08 DIAGNOSIS — Z7982 Long term (current) use of aspirin: Secondary | ICD-10-CM | POA: Diagnosis not present

## 2024-05-08 DIAGNOSIS — I714 Abdominal aortic aneurysm, without rupture, unspecified: Secondary | ICD-10-CM | POA: Insufficient documentation

## 2024-05-08 LAB — BASIC METABOLIC PANEL WITH GFR
Anion gap: 10 (ref 5–15)
BUN: 14 mg/dL (ref 8–23)
CO2: 28 mmol/L (ref 22–32)
Calcium: 9.6 mg/dL (ref 8.9–10.3)
Chloride: 103 mmol/L (ref 98–111)
Creatinine, Ser: 1.44 mg/dL — ABNORMAL HIGH (ref 0.44–1.00)
GFR, Estimated: 41 mL/min — ABNORMAL LOW
Glucose, Bld: 93 mg/dL (ref 70–99)
Potassium: 4.5 mmol/L (ref 3.5–5.1)
Sodium: 140 mmol/L (ref 135–145)

## 2024-05-08 LAB — LIPID PANEL
Cholesterol: 133 mg/dL (ref 0–200)
HDL: 67 mg/dL
LDL Cholesterol: 40 mg/dL (ref 0–99)
Total CHOL/HDL Ratio: 2 ratio
Triglycerides: 127 mg/dL
VLDL: 25 mg/dL (ref 0–40)

## 2024-05-08 LAB — PRO BRAIN NATRIURETIC PEPTIDE: Pro Brain Natriuretic Peptide: 318 pg/mL — ABNORMAL HIGH

## 2024-05-08 NOTE — Progress Notes (Signed)
 "  ADVANCED HF CLINIC NOTE   PCP: Pcp, No HF Cardiology: Dr. Rolan  Chief complaint: CHF  Erin [name]  Erin [name] is a 63 y.o. with history of CAD, ischemic cardiomyopathy, and PAD was referred by Dr. Milbert for evaluation of CHF.  She had NSTEMI in 11/17 with PCI to prox/mid LCx and RCA.  Subsequently, has developed ischemic cardiomyopathy.  Most recent echo in 1/21 showed EF 30-35%.  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   RHC/LHC done with exertional chest heaviness and dyspnea in 4/21 showed normal filling pressures, preserved cardiac output, and nonobstructive mild CAD.  ABIs were normal in 4/21.   In 8/21, she had syncope thought to be related to orthostasis from low BP in the setting of cardiac medication titration.  Entresto  was decreased.   She was in the ER in 11/21 with chest pain.  Troponin and COVID-19 negative. Echo in 11/21 showed EF 30-35%, normal RV.   She was hospitalized in 6/22 with left-sided weakness/numbness.  No evidence for acute CVA, ?TIA.    Echo in 11/22 showed EF 30-35% with mild LV dilation, normal RV, mild-moderate MR, IVC normal.   Echo 2/24 showed EF remains 30-35% with normal RV.   Patient had left shoulder replacement in 11/24.   In 2/25, she was diagnosed with Stage Ia LUL lung adenocarcinoma.    Echo in 3/25 showed EF 35-40%, RV normal, IVC normal. Cardolite in 4/25 showed inferior/inferolateral infarction with no ischemia.   S/p L upper lobectomy 4/25, 5 benign lymph nodes neg for CA.   Admitted 9/25 for near syncope. SBP in 80's. GDMT held, given IVF and midodrine  started. Echo showed EF 40%, G1DD, normal RV.  Today she returns for HF follow up. She is no longer taking midodrine .  Lung cancer is now under surveillance, no further active treatment.  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  She gets short of breath with bathing.  Generally  does ok walking around the house.  Symptoms are stable.  No chest pain.  Occasional lightheadedness with standing, no falls.  No orthopnea/PND.   Medtronic device interrogation (personally reviewed): OptiVol stable, 100% VS, no AF, 2.7 hr/day activity.  ECG (personally reviewed): NSR, nonspecific T wave flattening  Labs (7/25): LDL 37 Labs (9/25): K 3.7, creatinine 1.59 Labs (10/25): K 3.9, creatinine 1.77, BNP 92.5  PMH: 1. CAD: NSTEMI with DES to proximal and mid LCx in 11/17, staged DES x 2 to RCA later in 11/17.  - Cardiolite  (10/20): EF < 30%, large inferior and inferolateral MI with mild peri-infarct ischemia.  - LHC (4/21): Patent stents, nonobstructive CAD.  - Lexiscan  Cardiolite  (4/25): EF 35%, inferior and inferolateral infarction with no ischemia.  2. Chronic systolic CHF: Ischemic cardiomyopathy. Medtronic ICD.  - Echo (10/20): EF 35-40%, lateral WMAs, normal RV. - Echo (1/21): EF 30-35%, mild LVH, normal RV - RHC (4/21): mean RA 5, PA 29/3, mean PCWP 12, CI 3.04 - Echo (11/21): EF 30-35%, normal RV.  - Echo (11/22): EF 30-35% with mild LV dilation, normal RV, mild-moderate MR, IVC normal.  - Echo (2/24): EF 30-35%, normal RV.  - Echo (3/25): EF 35-40%, RV normal, IVC normal - Echo (9/25): EF 40%, G1DD, normal RV 3. 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  4. Carotid stenosis: Carotid dopplers (10/20) with 60-79% LICA stenosis.  - Carotid dopplers (6/21): 40-59% BICA stenosis.  - Carotid  dopplers (1/22): 40-59% RICA, 60-79% LICA.  - Carotid dopplers (4/23): 40-59% LICA - Carotid dopplers (12/23): 40-59% LICA - Carotid dopplers (12/24): 40-59% LICA stenosis 5. Prior smoker.  6. PAD: ABIs (4/21) Normal.  - Peripheral artery dopplers (12/21): bilateral plaque without focal stenosis.  - ABIs normal in 10/25.  7. Left shoulder replacement 11/24 8. Lung cancer: NSCLC (adenoCA), stage Ia, diagnosed in 2/25.  9. AAA: Abdominal US  in 10/25  showed 3.4 cm AAA.  10. CKD stage 3  Social History   Socioeconomic History   Marital status: Married    Spouse name: Not on file   Number of children: Not on file   Years of education: Not on file   Highest education level: Associate degree: occupational, scientist, product/process development, or vocational program  Occupational History   Occupation: CNA  Tobacco Use   Smoking status: Former    Current packs/day: 1.50    Average packs/day: 1.5 packs/day for 40.0 years (60.0 ttl pk-yrs)    Types: Cigarettes   Smokeless tobacco: Never   Tobacco comments:    Smokes off and on 06/14/23 Sonny Lecher, CMA     No cigarettes since Thursday this week>> 07/06/2023  Vaping Use   Vaping status: Never Used  Substance and Sexual Activity   Alcohol use: Not Currently    Comment: occasionally   Drug use: Not Currently    Types: Marijuana   Sexual activity: Not Currently    Birth control/protection: Surgical    Comment: hyst  Other Topics Concern   Not on file  Social History Narrative   Lives with husband in [place] in a one story home with a basement.  Has 4 children.  Works as a LAWYER.  Education: CNA school.    Social Drivers of Health   Tobacco Use: Medium Risk (05/08/2024)   Patient History    Smoking Tobacco Use: Former    Smokeless Tobacco Use: Never    Passive Exposure: Not on file  Financial Resource Strain: Low Risk (03/11/2024)   Overall Financial Resource Strain (CARDIA)    Difficulty of Paying Living Expenses: Not very hard  Food Insecurity: Patient Declined (03/11/2024)   Epic    Worried About Programme Researcher, Broadcasting/film/video in the Last Year: Patient declined    Barista in the Last Year: Patient declined  Transportation Needs: No Transportation Needs (03/11/2024)   Epic    Lack of Transportation (Medical): No    Lack of Transportation (Non-Medical): No  Physical Activity: Insufficiently Active (03/11/2024)   Exercise Vital Sign    Days of Exercise per Week: 3 days    Minutes of Exercise per Session:  30 min  Stress: Stress Concern Present (03/11/2024)   [employer] of Occupational Health - Occupational Stress Questionnaire    Feeling of Stress: Very much  Social Connections: Socially Isolated (03/11/2024)   Social Connection and Isolation Panel    Frequency of Communication with Friends and Family: Never    Frequency of Social Gatherings with Friends and Family: Never    Attends Religious Services: Never    Database Administrator or Organizations: No    Attends Banker Meetings: Never    Marital Status: Married  Catering Manager Violence: Not At Risk (03/14/2024)   Epic    Fear of Current or Ex-Partner: No    Emotionally Abused: No    Physically Abused: No    Sexually Abused: No  Depression (PHQ2-9): Low Risk (03/14/2024)   Depression (PHQ2-9)  PHQ-2 Score: 0  Alcohol Screen: Low Risk (06/11/2021)   Alcohol Screen    Last Alcohol Screening Score (AUDIT): 3  Housing: Low Risk (03/11/2024)   Epic    Unable to Pay for Housing in the Last Year: No    Number of Times Moved in the Last Year: 0    Homeless in the Last Year: No  Utilities: Not At Risk (03/14/2024)   Epic    Threatened with loss of utilities: No  Health Literacy: Adequate Health Literacy (03/14/2024)   B1300 Health Literacy    Frequency of need for help with medical instructions: Never   Family History  Problem Relation Age of Onset   Stroke Mother    Hypertension Mother    Diabetes Mother    Heart attack Mother    Heart attack Father    Diabetes Father    Hypertension Father    CAD Father    Colon polyps Father 68       pre-cancerous    Stroke Father    Dementia Father    Hyperlipidemia Father    Arthritis Father    COPD Father        smoked and worked archivist mills   Heart disease Father    Breast cancer Maternal Grandmother    Diabetes Maternal Grandmother    Cancer Maternal Grandfather        Tongue and esophageal   Cancer Paternal Grandmother    Anxiety disorder Daughter     Depression Daughter    Anxiety disorder Daughter    Heart failure Other    Colon cancer Neg Hx    ROS: All systems reviewed and negative except as per HPI.   Current Outpatient Medications  Medication Sig Dispense Refill   aspirin  EC 81 MG tablet Take 1 tablet (81 mg total) by mouth every evening. Okay to restart on 2/29/2025     atorvastatin  (LIPITOR ) 40 MG tablet Take 1 tablet (40 mg total) by mouth daily. 30 tablet 11   busPIRone  (BUSPAR ) 10 MG tablet Take 1 tablet (10 mg total) by mouth 3 (three) times daily before meals. 90 tablet 3   clopidogrel  (PLAVIX ) 75 MG tablet Take 1 tablet by mouth once daily 90 tablet 3   EPINEPHrine  (EPIPEN  2-PAK) 0.3 mg/0.3 mL IJ SOAJ injection Inject 0.3 mg into the muscle as needed for anaphylaxis. 2 each 2   ezetimibe  (ZETIA ) 10 MG tablet Take 1 tablet (10 mg total) by mouth daily. 90 tablet 3   FARXIGA  10 MG TABS tablet Take 1 tablet (10 mg total) by mouth daily before breakfast. 90 tablet 0   fenofibrate  (TRICOR ) 145 MG tablet Take 1 tablet by mouth once daily 30 tablet 11   furosemide  (LASIX ) 20 MG tablet Take 2 tablets (40 mg total) by mouth daily. 90 tablet 3   gabapentin  (NEURONTIN ) 300 MG capsule Take 1 capsule by mouth at bedtime 30 capsule 0   hydrOXYzine  (VISTARIL ) 25 MG capsule TAKE 1 CAPSULE BY MOUTH EVERY 8 HOURS AS NEEDED 30 capsule 0   linaclotide  (LINZESS ) 145 MCG CAPS capsule Take 1 capsule (145 mcg total) by mouth daily before breakfast. 30 capsule 11   metoprolol  succinate (TOPROL  XL) 25 MG 24 hr tablet Take 0.5 tablets (12.5 mg total) by mouth at bedtime. 90 tablet 3   Multiple Vitamins-Minerals (MULTIVITAMIN WITH MINERALS) tablet Take 1 tablet by mouth daily.     nicotine  (NICODERM CQ  - DOSED IN MG/24 HOURS) 21 mg/24hr patch Place 21 mg  onto the skin daily.     nitroGLYCERIN  (NITROSTAT ) 0.4 MG SL tablet Place 1 tablet (0.4 mg total) under the tongue every 5 (five) minutes x 3 doses as needed for chest pain (if no relief after 2nd dose,  proceed to the ED for an evaluation or call 911). 25 tablet 2   ondansetron  (ZOFRAN ) 8 MG tablet Take 1 tablet (8 mg total) by mouth every 8 (eight) hours as needed for nausea or vomiting. 90 tablet 3   pantoprazole  (PROTONIX ) 40 MG tablet TAKE 1 TABLET BY MOUTH TWICE DAILY BEFORE A MEAL 60 tablet 5   potassium chloride  SA (KLOR-CON  M) 20 MEQ tablet Take 1 tablet (20 mEq total) by mouth daily. 90 tablet 3   promethazine  (PHENERGAN ) 12.5 MG tablet TAKE 1 TABLET BY MOUTH EVERY 6 HOURS AS NEEDED FOR NAUSEA OR VOMITING 20 tablet 0   REPATHA  SURECLICK 140 MG/ML SOAJ INJECT 140 MG INTO THE SKIN EVERY 14 DAYS 6 mL 0   rOPINIRole  (REQUIP ) 0.5 MG tablet Take 0.5 mg by mouth at bedtime.   0   sertraline  (ZOLOFT ) 25 MG tablet Take 1 tablet (25 mg total) by mouth daily. 30 tablet 0   traZODone  (DESYREL ) 50 MG tablet TAKE 1 TABLET BY MOUTH AT BEDTIME 90 tablet 0   No current facility-administered medications for this encounter.   Wt Readings from Last 3 Encounters:  05/08/24 56 kg (123 lb 6.4 oz)  03/18/24 53.5 kg (118 lb)  03/14/24 53.5 kg (118 lb)   BP 92/60   Pulse 82   Wt 56 kg (123 lb 6.4 oz)   SpO2 99%   BMI 24.10 kg/m  General: NAD Neck: No JVD, no thyromegaly or thyroid  nodule.  Lungs: Clear to auscultation bilaterally with normal respiratory effort. CV: Nondisplaced PMI.  Heart regular S1/S2, no S3/S4, no murmur.  No peripheral edema.  No carotid bruit.  Difficult to palpate pedal pulses.  Abdomen: Soft, nontender, no hepatosplenomegaly, no distention.  Skin: Intact without lesions or rashes.  Neurologic: Alert and oriented x 3.  Psych: Normal affect. Extremities: No clubbing or cyanosis.  HEENT: Normal.   Assessment/Plan: 1. CAD: S/p NSTEMI in 11/17 with DEX to LCx and DES x 2 to RCA.  Cardiolite  in 10/20 with EF <30%, inferior/inferolateral infarct with peri-infarct ischemia. LHC in 4/21 showed nonobstructive mild CAD.  Cardiolite  in 4/25 was similar with inferior and inferolateral  infarction, no ischemia.  No chest pain.  - Continue atorvastatin  + fenofibrate  + Repatha . Check lipids today.  - She has been on ASA 81 and Plavix  75 mg daily.   2. Chronic systolic CHF: Ischemic cardiomyopathy.  RHC in 4/21 with normal filling pressures and preserved cardiac output.  Echo 2/24 showed stable EF 30-35%.  Echo in 3/25 showed EF 35-40%, normal RV.  Echo 9/25 showed EF 40%, G1DD, normal RV. Stable NYHA class II-III symptoms, not volume overloaded on exam. GDMT limited by hypotension/orthostasis.  She is now off midodrine .  - Continue compression hose.  - She can stay off midodrine .  - Continue Lasix  40 mg daily + 20 KCL daily. BMET/BNP today.  - Continue Farixga 10 mg daily.   - Continue Toprol  XL 12.5 mg at bedtime.  - Off spironolactone  with orthostasis - Off losartan  with orthostasis.  - I think she is a good candidate for cardiac contractility modulator. Now that she has completed lung cancer treatment, I will send her to Dr. Inocencio for evaluation for CCM.  3. Carotid stenosis: Carotid dopplers  12/24 showed 40-59% LICA stenosis - Repeat carotid dopplers.  4. Smoking: She is staying off cigarettes since 02/2023. Congratulated. 5. 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  - Continue Plavix  per Dr. Dolphus 6. Lung cancer: Non-small cell lung CA, adenocarcinoma, diagnosed 2/25. S/p robot assisted thoracoscopy and lobectomy by Dr. Shyrl 4/25. 5/5 lymph nodes negative for cancer.  Now on surveillance.  - She follows with Dr. Sherrod  7. AAA: Followed by [facility], 3.4 cm in 10/25.  8. PAD: ABIs normal in 10/25.  9. CKD stage 3: Check BMET today.   Follow up in 4 months with APP.   I spent 31 minutes reviewing records, interviewing/examining patient, and managing orders.   Ezra Shuck  05/08/2024 "

## 2024-05-08 NOTE — Patient Instructions (Signed)
 There has been no changes to your medications.  Labs done today, your results will be available in MyChart, we will contact you for abnormal readings.   You have been referred to Dr. Inocencio. His office will call you to arrange your appointment.  Your provider has ordered Carotid Dopplers. You will be called to have this test arranged.  Your physician recommends that you schedule a follow-up appointment in: 4 months.  If you have any questions or concerns before your next appointment please send us  a message through [place] or call our office at [phone].    TO LEAVE A MESSAGE FOR THE NURSE SELECT OPTION 2, PLEASE LEAVE A MESSAGE INCLUDING: YOUR NAME DATE OF BIRTH CALL BACK NUMBER REASON FOR CALL**this is important as we prioritize the call backs  YOU WILL RECEIVE A CALL BACK THE SAME DAY AS LONG AS YOU CALL BEFORE 4:00 PM  At the [facility], you and your health needs are our priority. As part of our continuing mission to provide you with exceptional heart care, we have created designated Provider Care Teams. These Care Teams include your primary Cardiologist (physician) and Advanced Practice Providers (APPs- Physician Assistants and Nurse Practitioners) who all work together to provide you with the care you need, when you need it.   You may see any of the following providers on your designated Care Team at your next follow up: Dr Toribio Fuel Dr Ezra Shuck Dr. Morene Brownie Greig Mosses, NP Caffie Shed, [place] [facility] [facility] [place], [place] Beckey Coe, NP Jordan Lee, NP Ellouise Class, NP Tinnie Redman, PharmD Jaun Bash, PharmD   Please be sure to bring in all your medications bottles to every appointment.    Thank you for choosing [facility]

## 2024-05-15 ENCOUNTER — Ambulatory Visit

## 2024-05-15 DIAGNOSIS — I255 Ischemic cardiomyopathy: Secondary | ICD-10-CM

## 2024-05-16 ENCOUNTER — Ambulatory Visit: Payer: Self-pay | Admitting: Cardiology

## 2024-05-16 LAB — CUP PACEART REMOTE DEVICE CHECK
Battery Remaining Longevity: 90 mo
Battery Voltage: 2.99 V
Brady Statistic RV Percent Paced: 0 %
Date Time Interrogation Session: 20260107104223
HighPow Impedance: 68 Ohm
Implantable Lead Connection Status: 753985
Implantable Lead Implant Date: 20211126
Implantable Lead Location: 753860
Implantable Pulse Generator Implant Date: 20211126
Lead Channel Impedance Value: 494 Ohm
Lead Channel Impedance Value: 570 Ohm
Lead Channel Pacing Threshold Amplitude: 0.625 V
Lead Channel Pacing Threshold Pulse Width: 0.4 ms
Lead Channel Sensing Intrinsic Amplitude: 3.375 mV
Lead Channel Sensing Intrinsic Amplitude: 3.375 mV
Lead Channel Setting Pacing Amplitude: 2 V
Lead Channel Setting Pacing Pulse Width: 0.4 ms
Lead Channel Setting Sensing Sensitivity: 0.3 mV
Zone Setting Status: 755011
Zone Setting Status: 755011

## 2024-05-18 ENCOUNTER — Other Ambulatory Visit: Payer: Self-pay | Admitting: Family Medicine

## 2024-05-18 DIAGNOSIS — G6289 Other specified polyneuropathies: Secondary | ICD-10-CM

## 2024-05-20 NOTE — Progress Notes (Signed)
 Remote ICD Transmission

## 2024-05-22 ENCOUNTER — Ambulatory Visit (HOSPITAL_COMMUNITY)
Admission: RE | Admit: 2024-05-22 | Discharge: 2024-05-22 | Disposition: A | Discharge status: Home / Self Care | Source: Ambulatory Visit | Attending: Cardiology | Admitting: Cardiology

## 2024-05-22 DIAGNOSIS — I25119 Atherosclerotic heart disease of native coronary artery with unspecified angina pectoris: Secondary | ICD-10-CM | POA: Insufficient documentation

## 2024-05-22 DIAGNOSIS — I6523 Occlusion and stenosis of bilateral carotid arteries: Secondary | ICD-10-CM | POA: Diagnosis not present

## 2024-05-31 ENCOUNTER — Ambulatory Visit: Admitting: Orthopedic Surgery

## 2024-06-03 ENCOUNTER — Ambulatory Visit: Admitting: Orthopedic Surgery

## 2024-06-05 ENCOUNTER — Ambulatory Visit: Admitting: Orthopedic Surgery

## 2024-06-06 NOTE — Progress Notes (Signed)
 31 day ICM Remote transmission canceled due to [facility] clinic is on hold until further notice.  91 day remote monitoring will continue per protocol.

## 2024-06-10 ENCOUNTER — Ambulatory Visit

## 2024-06-11 ENCOUNTER — Other Ambulatory Visit: Payer: Self-pay | Admitting: Internal Medicine

## 2024-06-11 ENCOUNTER — Other Ambulatory Visit: Payer: Self-pay | Admitting: Gastroenterology

## 2024-06-11 DIAGNOSIS — F410 Panic disorder [episodic paroxysmal anxiety] without agoraphobia: Secondary | ICD-10-CM

## 2024-06-12 ENCOUNTER — Ambulatory Visit: Admitting: Orthopedic Surgery

## 2024-06-19 ENCOUNTER — Ambulatory Visit: Admitting: Orthopedic Surgery

## 2024-07-10 ENCOUNTER — Encounter

## 2024-07-11 ENCOUNTER — Ambulatory Visit: Admitting: Cardiology

## 2024-08-05 ENCOUNTER — Ambulatory Visit (HOSPITAL_COMMUNITY): Admitting: Registered Nurse

## 2024-08-14 ENCOUNTER — Encounter

## 2024-08-20 ENCOUNTER — Ambulatory Visit (HOSPITAL_COMMUNITY)

## 2024-09-09 ENCOUNTER — Inpatient Hospital Stay: Attending: Internal Medicine

## 2024-09-17 ENCOUNTER — Inpatient Hospital Stay: Admitting: Internal Medicine

## 2024-11-13 ENCOUNTER — Encounter

## 2025-02-12 ENCOUNTER — Encounter

## 2025-03-18 ENCOUNTER — Ambulatory Visit

## 2025-05-14 ENCOUNTER — Encounter
